# Patient Record
Sex: Male | Born: 1940 | Race: White | Hispanic: No | Marital: Married | Smoking: Former smoker
Health system: Southern US, Community
[De-identification: ages and names within clinical notes are randomized; demographics above are authoritative.]

## PROBLEM LIST (undated history)

## (undated) DIAGNOSIS — I5032 Chronic diastolic (congestive) heart failure: Secondary | ICD-10-CM

## (undated) DIAGNOSIS — I2510 Atherosclerotic heart disease of native coronary artery without angina pectoris: Secondary | ICD-10-CM

## (undated) DIAGNOSIS — I2721 Secondary pulmonary arterial hypertension: Secondary | ICD-10-CM

## (undated) DIAGNOSIS — K219 Gastro-esophageal reflux disease without esophagitis: Secondary | ICD-10-CM

## (undated) DIAGNOSIS — N184 Chronic kidney disease, stage 4 (severe): Secondary | ICD-10-CM

## (undated) DIAGNOSIS — E785 Hyperlipidemia, unspecified: Secondary | ICD-10-CM

## (undated) DIAGNOSIS — I10 Essential (primary) hypertension: Secondary | ICD-10-CM

## (undated) DIAGNOSIS — I4821 Permanent atrial fibrillation: Secondary | ICD-10-CM

## (undated) DIAGNOSIS — S065XAA Traumatic subdural hemorrhage with loss of consciousness status unknown, initial encounter: Secondary | ICD-10-CM

## (undated) DIAGNOSIS — E119 Type 2 diabetes mellitus without complications: Secondary | ICD-10-CM

## (undated) DIAGNOSIS — N179 Acute kidney failure, unspecified: Secondary | ICD-10-CM

## (undated) HISTORY — PX: HERNIA REPAIR: SHX51

## (undated) HISTORY — PX: CORONARY STENT INTERVENTION: CATH118234

## (undated) HISTORY — DX: Gastro-esophageal reflux disease without esophagitis: K21.9

## (undated) HISTORY — PX: CARDIAC CATHETERIZATION: SHX172

## (undated) HISTORY — PX: CYSTOSCOPY: SUR368

## (undated) HISTORY — PX: BLEPHAROPLASTY: SUR158

---
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
## 2012-01-04 DIAGNOSIS — N200 Calculus of kidney: Secondary | ICD-10-CM | POA: Insufficient documentation

## 2014-06-27 DIAGNOSIS — N183 Chronic kidney disease, stage 3 (moderate): Secondary | ICD-10-CM

## 2014-06-27 DIAGNOSIS — I252 Old myocardial infarction: Secondary | ICD-10-CM

## 2014-06-27 DIAGNOSIS — E1122 Type 2 diabetes mellitus with diabetic chronic kidney disease: Secondary | ICD-10-CM

## 2014-06-27 HISTORY — DX: Old myocardial infarction: I25.2

## 2014-09-29 DIAGNOSIS — N401 Enlarged prostate with lower urinary tract symptoms: Secondary | ICD-10-CM | POA: Insufficient documentation

## 2017-01-30 DIAGNOSIS — E559 Vitamin D deficiency, unspecified: Secondary | ICD-10-CM | POA: Insufficient documentation

## 2017-01-30 DIAGNOSIS — N2581 Secondary hyperparathyroidism of renal origin: Secondary | ICD-10-CM | POA: Insufficient documentation

## 2018-05-01 DIAGNOSIS — M19041 Primary osteoarthritis, right hand: Secondary | ICD-10-CM | POA: Diagnosis not present

## 2018-05-01 DIAGNOSIS — E1122 Type 2 diabetes mellitus with diabetic chronic kidney disease: Secondary | ICD-10-CM | POA: Diagnosis not present

## 2018-05-01 DIAGNOSIS — M7989 Other specified soft tissue disorders: Secondary | ICD-10-CM | POA: Diagnosis not present

## 2018-05-01 DIAGNOSIS — M79641 Pain in right hand: Secondary | ICD-10-CM | POA: Diagnosis not present

## 2018-05-01 DIAGNOSIS — I252 Old myocardial infarction: Secondary | ICD-10-CM | POA: Diagnosis not present

## 2018-05-01 DIAGNOSIS — Z6836 Body mass index (BMI) 36.0-36.9, adult: Secondary | ICD-10-CM | POA: Diagnosis not present

## 2018-05-01 DIAGNOSIS — M1990 Unspecified osteoarthritis, unspecified site: Secondary | ICD-10-CM | POA: Diagnosis not present

## 2018-05-01 DIAGNOSIS — Z87891 Personal history of nicotine dependence: Secondary | ICD-10-CM | POA: Diagnosis not present

## 2018-05-01 DIAGNOSIS — I129 Hypertensive chronic kidney disease with stage 1 through stage 4 chronic kidney disease, or unspecified chronic kidney disease: Secondary | ICD-10-CM | POA: Diagnosis not present

## 2018-05-01 DIAGNOSIS — N189 Chronic kidney disease, unspecified: Secondary | ICD-10-CM | POA: Diagnosis not present

## 2018-05-10 DIAGNOSIS — N342 Other urethritis: Secondary | ICD-10-CM | POA: Diagnosis not present

## 2018-05-10 DIAGNOSIS — N183 Chronic kidney disease, stage 3 (moderate): Secondary | ICD-10-CM | POA: Diagnosis not present

## 2018-05-10 DIAGNOSIS — Z794 Long term (current) use of insulin: Secondary | ICD-10-CM | POA: Diagnosis not present

## 2018-05-10 DIAGNOSIS — E1122 Type 2 diabetes mellitus with diabetic chronic kidney disease: Secondary | ICD-10-CM | POA: Diagnosis not present

## 2018-05-10 DIAGNOSIS — M25441 Effusion, right hand: Secondary | ICD-10-CM | POA: Diagnosis not present

## 2018-05-29 DIAGNOSIS — M19042 Primary osteoarthritis, left hand: Secondary | ICD-10-CM | POA: Diagnosis not present

## 2018-05-29 DIAGNOSIS — M19041 Primary osteoarthritis, right hand: Secondary | ICD-10-CM | POA: Diagnosis not present

## 2018-06-01 DIAGNOSIS — E1122 Type 2 diabetes mellitus with diabetic chronic kidney disease: Secondary | ICD-10-CM | POA: Diagnosis not present

## 2018-06-01 DIAGNOSIS — N342 Other urethritis: Secondary | ICD-10-CM | POA: Diagnosis not present

## 2018-06-01 DIAGNOSIS — M02341 Reiter's disease, right hand: Secondary | ICD-10-CM | POA: Diagnosis not present

## 2018-06-01 DIAGNOSIS — N183 Chronic kidney disease, stage 3 (moderate): Secondary | ICD-10-CM | POA: Diagnosis not present

## 2018-06-01 DIAGNOSIS — M79641 Pain in right hand: Secondary | ICD-10-CM | POA: Diagnosis not present

## 2018-06-01 DIAGNOSIS — I10 Essential (primary) hypertension: Secondary | ICD-10-CM | POA: Diagnosis not present

## 2018-06-01 DIAGNOSIS — E785 Hyperlipidemia, unspecified: Secondary | ICD-10-CM | POA: Diagnosis not present

## 2018-06-01 DIAGNOSIS — M25441 Effusion, right hand: Secondary | ICD-10-CM | POA: Diagnosis not present

## 2018-06-01 DIAGNOSIS — Z79899 Other long term (current) drug therapy: Secondary | ICD-10-CM | POA: Diagnosis not present

## 2018-06-01 DIAGNOSIS — M0230 Reiter's disease, unspecified site: Secondary | ICD-10-CM | POA: Diagnosis not present

## 2018-06-01 DIAGNOSIS — E1165 Type 2 diabetes mellitus with hyperglycemia: Secondary | ICD-10-CM | POA: Diagnosis not present

## 2018-06-01 DIAGNOSIS — Z794 Long term (current) use of insulin: Secondary | ICD-10-CM | POA: Diagnosis not present

## 2018-06-01 DIAGNOSIS — I129 Hypertensive chronic kidney disease with stage 1 through stage 4 chronic kidney disease, or unspecified chronic kidney disease: Secondary | ICD-10-CM | POA: Diagnosis not present

## 2018-06-05 DIAGNOSIS — I2510 Atherosclerotic heart disease of native coronary artery without angina pectoris: Secondary | ICD-10-CM | POA: Insufficient documentation

## 2018-06-05 DIAGNOSIS — I25118 Atherosclerotic heart disease of native coronary 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: Secondary | ICD-10-CM | POA: Insufficient documentation

## 2018-06-30 DIAGNOSIS — I2510 Atherosclerotic heart disease of native coronary artery without angina pectoris: Secondary | ICD-10-CM | POA: Diagnosis not present

## 2018-06-30 DIAGNOSIS — N342 Other urethritis: Secondary | ICD-10-CM | POA: Diagnosis not present

## 2018-06-30 DIAGNOSIS — E1122 Type 2 diabetes mellitus with diabetic chronic kidney disease: Secondary | ICD-10-CM | POA: Diagnosis not present

## 2018-06-30 DIAGNOSIS — M25441 Effusion, right hand: Secondary | ICD-10-CM | POA: Diagnosis not present

## 2018-06-30 DIAGNOSIS — N183 Chronic kidney disease, stage 3 (moderate): Secondary | ICD-10-CM | POA: Diagnosis not present

## 2018-06-30 DIAGNOSIS — Z794 Long term (current) use of insulin: Secondary | ICD-10-CM | POA: Diagnosis not present

## 2018-06-30 DIAGNOSIS — I10 Essential (primary) hypertension: Secondary | ICD-10-CM | POA: Diagnosis not present

## 2018-07-04 DIAGNOSIS — R262 Difficulty in walking, not elsewhere classified: Secondary | ICD-10-CM | POA: Diagnosis not present

## 2018-07-04 DIAGNOSIS — I129 Hypertensive chronic kidney disease with stage 1 through stage 4 chronic kidney disease, or unspecified chronic kidney disease: Secondary | ICD-10-CM | POA: Diagnosis not present

## 2018-07-04 DIAGNOSIS — M79641 Pain in right hand: Secondary | ICD-10-CM | POA: Diagnosis not present

## 2018-07-04 DIAGNOSIS — M79642 Pain in left hand: Secondary | ICD-10-CM | POA: Diagnosis not present

## 2018-07-04 DIAGNOSIS — N183 Chronic kidney disease, stage 3 (moderate): Secondary | ICD-10-CM | POA: Diagnosis not present

## 2018-07-04 DIAGNOSIS — M25462 Effusion, left knee: Secondary | ICD-10-CM | POA: Diagnosis not present

## 2018-07-04 DIAGNOSIS — M1712 Unilateral primary osteoarthritis, left knee: Secondary | ICD-10-CM | POA: Diagnosis not present

## 2018-07-04 DIAGNOSIS — M25562 Pain in left knee: Secondary | ICD-10-CM | POA: Diagnosis not present

## 2018-07-04 DIAGNOSIS — N390 Urinary tract infection, site not specified: Secondary | ICD-10-CM | POA: Diagnosis not present

## 2018-07-04 DIAGNOSIS — E1122 Type 2 diabetes mellitus with diabetic chronic kidney disease: Secondary | ICD-10-CM | POA: Diagnosis not present

## 2018-07-04 DIAGNOSIS — Z87891 Personal history of nicotine dependence: Secondary | ICD-10-CM | POA: Diagnosis not present

## 2018-07-04 DIAGNOSIS — Z6835 Body mass index (BMI) 35.0-35.9, adult: Secondary | ICD-10-CM | POA: Diagnosis not present

## 2018-07-04 DIAGNOSIS — I252 Old myocardial infarction: Secondary | ICD-10-CM | POA: Diagnosis not present

## 2018-07-04 DIAGNOSIS — I2510 Atherosclerotic heart disease of native coronary artery without angina pectoris: Secondary | ICD-10-CM | POA: Diagnosis not present

## 2018-07-04 DIAGNOSIS — M109 Gout, unspecified: Secondary | ICD-10-CM | POA: Diagnosis not present

## 2018-07-06 ENCOUNTER — Emergency Department: Payer: Medicare HMO

## 2018-07-06 ENCOUNTER — Other Ambulatory Visit: Payer: Self-pay

## 2018-07-06 ENCOUNTER — Inpatient Hospital Stay
Admission: EM | Admit: 2018-07-06 | Discharge: 2018-07-12 | DRG: 683 | Disposition: A | Discharge status: Skilled Nursing Facility | Payer: Medicare HMO | Attending: Internal Medicine | Admitting: Internal Medicine

## 2018-07-06 ENCOUNTER — Encounter: Payer: Self-pay | Admitting: *Deleted

## 2018-07-06 DIAGNOSIS — R41841 Cognitive communication deficit: Secondary | ICD-10-CM | POA: Diagnosis not present

## 2018-07-06 DIAGNOSIS — E669 Obesity, unspecified: Secondary | ICD-10-CM | POA: Diagnosis present

## 2018-07-06 DIAGNOSIS — N400 Enlarged prostate without lower urinary tract symptoms: Secondary | ICD-10-CM | POA: Diagnosis present

## 2018-07-06 DIAGNOSIS — M10071 Idiopathic gout, right ankle and foot: Secondary | ICD-10-CM | POA: Diagnosis present

## 2018-07-06 DIAGNOSIS — E1122 Type 2 diabetes mellitus with diabetic chronic kidney disease: Secondary | ICD-10-CM | POA: Diagnosis not present

## 2018-07-06 DIAGNOSIS — I4891 Unspecified atrial fibrillation: Secondary | ICD-10-CM | POA: Diagnosis present

## 2018-07-06 DIAGNOSIS — R4781 Slurred speech: Secondary | ICD-10-CM | POA: Diagnosis not present

## 2018-07-06 DIAGNOSIS — I129 Hypertensive chronic kidney disease with stage 1 through stage 4 chronic kidney disease, or unspecified chronic kidney disease: Secondary | ICD-10-CM | POA: Diagnosis present

## 2018-07-06 DIAGNOSIS — W19XXXA Unspecified fall, initial encounter: Secondary | ICD-10-CM | POA: Diagnosis not present

## 2018-07-06 DIAGNOSIS — Z79899 Other long term (current) drug therapy: Secondary | ICD-10-CM

## 2018-07-06 DIAGNOSIS — M25571 Pain in right ankle and joints of right foot: Secondary | ICD-10-CM | POA: Diagnosis not present

## 2018-07-06 DIAGNOSIS — E785 Hyperlipidemia, unspecified: Secondary | ICD-10-CM | POA: Diagnosis not present

## 2018-07-06 DIAGNOSIS — K56600 Partial intestinal obstruction, unspecified as to cause: Secondary | ICD-10-CM | POA: Diagnosis not present

## 2018-07-06 DIAGNOSIS — I10 Essential (primary) hypertension: Secondary | ICD-10-CM | POA: Diagnosis present

## 2018-07-06 DIAGNOSIS — N3000 Acute cystitis without hematuria: Secondary | ICD-10-CM | POA: Diagnosis not present

## 2018-07-06 DIAGNOSIS — Z4659 Encounter for fitting and adjustment of other gastrointestinal appliance and device: Secondary | ICD-10-CM

## 2018-07-06 DIAGNOSIS — Z6838 Body mass index (BMI) 38.0-38.9, adult: Secondary | ICD-10-CM | POA: Diagnosis not present

## 2018-07-06 DIAGNOSIS — Z4682 Encounter for fitting and adjustment of non-vascular catheter: Secondary | ICD-10-CM | POA: Diagnosis not present

## 2018-07-06 DIAGNOSIS — R1110 Vomiting, unspecified: Secondary | ICD-10-CM

## 2018-07-06 DIAGNOSIS — M029 Reactive arthropathy, unspecified: Secondary | ICD-10-CM | POA: Diagnosis not present

## 2018-07-06 DIAGNOSIS — I2510 Atherosclerotic heart disease of native coronary artery without angina pectoris: Secondary | ICD-10-CM | POA: Diagnosis not present

## 2018-07-06 DIAGNOSIS — Z8719 Personal history of other diseases of the digestive system: Secondary | ICD-10-CM

## 2018-07-06 DIAGNOSIS — R531 Weakness: Secondary | ICD-10-CM | POA: Diagnosis not present

## 2018-07-06 DIAGNOSIS — N179 Acute kidney failure, unspecified: Secondary | ICD-10-CM | POA: Diagnosis not present

## 2018-07-06 DIAGNOSIS — Z87891 Personal history of nicotine dependence: Secondary | ICD-10-CM

## 2018-07-06 DIAGNOSIS — R5381 Other malaise: Secondary | ICD-10-CM | POA: Diagnosis not present

## 2018-07-06 DIAGNOSIS — N183 Chronic kidney disease, stage 3 (moderate): Secondary | ICD-10-CM | POA: Diagnosis not present

## 2018-07-06 DIAGNOSIS — E118 Type 2 diabetes mellitus with unspecified complications: Secondary | ICD-10-CM

## 2018-07-06 DIAGNOSIS — N390 Urinary tract infection, site not specified: Secondary | ICD-10-CM

## 2018-07-06 DIAGNOSIS — Z794 Long term (current) use of insulin: Secondary | ICD-10-CM | POA: Diagnosis not present

## 2018-07-06 DIAGNOSIS — R52 Pain, unspecified: Secondary | ICD-10-CM | POA: Diagnosis not present

## 2018-07-06 DIAGNOSIS — N184 Chronic kidney disease, stage 4 (severe): Secondary | ICD-10-CM

## 2018-07-06 DIAGNOSIS — M6281 Muscle weakness (generalized): Secondary | ICD-10-CM | POA: Diagnosis not present

## 2018-07-06 DIAGNOSIS — E119 Type 2 diabetes mellitus without complications: Secondary | ICD-10-CM | POA: Diagnosis not present

## 2018-07-06 DIAGNOSIS — K56609 Unspecified intestinal obstruction, unspecified as to partial versus complete obstruction: Secondary | ICD-10-CM | POA: Diagnosis not present

## 2018-07-06 DIAGNOSIS — R112 Nausea with vomiting, unspecified: Secondary | ICD-10-CM | POA: Diagnosis not present

## 2018-07-06 DIAGNOSIS — Z7401 Bed confinement status: Secondary | ICD-10-CM | POA: Diagnosis not present

## 2018-07-06 DIAGNOSIS — M1000 Idiopathic gout, unspecified site: Secondary | ICD-10-CM | POA: Diagnosis not present

## 2018-07-06 DIAGNOSIS — K6389 Other specified diseases of intestine: Secondary | ICD-10-CM | POA: Diagnosis not present

## 2018-07-06 DIAGNOSIS — K567 Ileus, unspecified: Secondary | ICD-10-CM | POA: Diagnosis not present

## 2018-07-06 DIAGNOSIS — R2681 Unsteadiness on feet: Secondary | ICD-10-CM | POA: Diagnosis not present

## 2018-07-06 DIAGNOSIS — M2550 Pain in unspecified joint: Secondary | ICD-10-CM | POA: Diagnosis not present

## 2018-07-06 DIAGNOSIS — E1169 Type 2 diabetes mellitus with other specified complication: Secondary | ICD-10-CM | POA: Diagnosis present

## 2018-07-06 DIAGNOSIS — N189 Chronic kidney disease, unspecified: Secondary | ICD-10-CM | POA: Diagnosis not present

## 2018-07-06 HISTORY — DX: Essential (primary) hypertension: I10

## 2018-07-06 HISTORY — DX: Atherosclerotic heart disease of native coronary artery without angina pectoris: I25.10

## 2018-07-06 HISTORY — DX: Acute kidney failure, unspecified: N17.9

## 2018-07-06 HISTORY — DX: Type 2 diabetes mellitus without complications: E11.9

## 2018-07-06 HISTORY — DX: Hyperlipidemia, unspecified: E78.5

## 2018-07-06 LAB — URINALYSIS, COMPLETE (UACMP) WITH MICROSCOPIC
Bacteria, UA: NONE SEEN
Bilirubin Urine: NEGATIVE
Glucose, UA: NEGATIVE mg/dL
Ketones, ur: NEGATIVE mg/dL
Nitrite: NEGATIVE
Protein, ur: NEGATIVE mg/dL
Specific Gravity, Urine: 1.014 (ref 1.005–1.030)
pH: 5 (ref 5.0–8.0)

## 2018-07-06 LAB — CBC WITH DIFFERENTIAL/PLATELET
Abs Immature Granulocytes: 0.11 10*3/uL — ABNORMAL HIGH (ref 0.00–0.07)
Basophils Absolute: 0 10*3/uL (ref 0.0–0.1)
Basophils Relative: 0 %
Eosinophils Absolute: 0.2 10*3/uL (ref 0.0–0.5)
Eosinophils Relative: 1 %
HCT: 36.6 % — ABNORMAL LOW (ref 39.0–52.0)
Hemoglobin: 11.6 g/dL — ABNORMAL LOW (ref 13.0–17.0)
Immature Granulocytes: 1 %
Lymphocytes Relative: 14 %
Lymphs Abs: 1.9 10*3/uL (ref 0.7–4.0)
MCH: 27.9 pg (ref 26.0–34.0)
MCHC: 31.7 g/dL (ref 30.0–36.0)
MCV: 88 fL (ref 80.0–100.0)
Monocytes Absolute: 1.3 10*3/uL — ABNORMAL HIGH (ref 0.1–1.0)
Monocytes Relative: 10 %
Neutro Abs: 10.3 10*3/uL — ABNORMAL HIGH (ref 1.7–7.7)
Neutrophils Relative %: 74 %
Platelets: 369 10*3/uL (ref 150–400)
RBC: 4.16 MIL/uL — ABNORMAL LOW (ref 4.22–5.81)
RDW: 14.6 % (ref 11.5–15.5)
WBC: 13.8 10*3/uL — ABNORMAL HIGH (ref 4.0–10.5)
nRBC: 0 % (ref 0.0–0.2)

## 2018-07-06 LAB — COMPREHENSIVE METABOLIC PANEL
ALT: 62 U/L — ABNORMAL HIGH (ref 0–44)
AST: 45 U/L — ABNORMAL HIGH (ref 15–41)
Albumin: 2.9 g/dL — ABNORMAL LOW (ref 3.5–5.0)
Alkaline Phosphatase: 99 U/L (ref 38–126)
Anion gap: 16 — ABNORMAL HIGH (ref 5–15)
BUN: 89 mg/dL — ABNORMAL HIGH (ref 8–23)
CO2: 22 mmol/L (ref 22–32)
Calcium: 8.8 mg/dL — ABNORMAL LOW (ref 8.9–10.3)
Chloride: 97 mmol/L — ABNORMAL LOW (ref 98–111)
Creatinine, Ser: 3.24 mg/dL — ABNORMAL HIGH (ref 0.61–1.24)
GFR calc Af Amer: 20 mL/min — ABNORMAL LOW (ref >60)
GFR calc non Af Amer: 17 mL/min — ABNORMAL LOW (ref >60)
Glucose, Bld: 245 mg/dL — ABNORMAL HIGH (ref 70–99)
Potassium: 4.3 mmol/L (ref 3.5–5.1)
Sodium: 135 mmol/L (ref 135–145)
Total Bilirubin: 0.2 mg/dL — ABNORMAL LOW (ref 0.3–1.2)
Total Protein: 7.1 g/dL (ref 6.5–8.1)

## 2018-07-06 LAB — TROPONIN I: Troponin I: 0.03 ng/mL (ref <0.03)

## 2018-07-06 LAB — GLUCOSE, CAPILLARY: Glucose-Capillary: 154 mg/dL — ABNORMAL HIGH (ref 70–99)

## 2018-07-06 LAB — ETHANOL: Alcohol, Ethyl (B): <10 mg/dL (ref <10)

## 2018-07-06 MED ORDER — SODIUM CHLORIDE 0.9 % IV SOLN
INTRAVENOUS | Status: DC
  Administered 2018-07-06: via INTRAVENOUS

## 2018-07-06 MED ORDER — ACETAMINOPHEN 650 MG RE SUPP: 650.0000 mg | Freq: Four times a day (QID) | RECTAL | Status: DC | PRN

## 2018-07-06 MED ORDER — INSULIN ASPART 100 UNIT/ML ~~LOC~~ SOLN
0.0000 [IU] | Freq: Every day | SUBCUTANEOUS | Status: DC
  Administered 2018-07-07 – 2018-07-09 (×2): 2 [IU] via SUBCUTANEOUS
  Administered 2018-07-10 – 2018-07-11 (×2): 4 [IU] via SUBCUTANEOUS
  Filled 2018-07-06 (×4): qty 1

## 2018-07-06 MED ORDER — ONDANSETRON HCL 4 MG/2ML IJ SOLN
4.0000 mg | Freq: Four times a day (QID) | INTRAMUSCULAR | Status: DC | PRN
  Administered 2018-07-08: 4 mg via INTRAVENOUS
  Filled 2018-07-06: qty 2

## 2018-07-06 MED ORDER — SODIUM CHLORIDE 0.9 % IV SOLN
1.0000 g | Freq: Once | INTRAVENOUS | Status: AC
  Administered 2018-07-06: 1 g via INTRAVENOUS
  Filled 2018-07-06: qty 10

## 2018-07-06 MED ORDER — PRAVASTATIN SODIUM 20 MG PO TABS
20.0000 mg | ORAL_TABLET | Freq: Every evening | ORAL | Status: DC
  Administered 2018-07-06 – 2018-07-07 (×2): 20 mg via ORAL
  Filled 2018-07-06 (×2): qty 1

## 2018-07-06 MED ORDER — ONDANSETRON HCL 4 MG PO TABS
4.0000 mg | ORAL_TABLET | Freq: Four times a day (QID) | ORAL | Status: DC | PRN
  Filled 2018-07-06: qty 1

## 2018-07-06 MED ORDER — NEBIVOLOL HCL 5 MG PO TABS
5.0000 mg | ORAL_TABLET | Freq: Every day | ORAL | Status: DC
  Administered 2018-07-07: 5 mg via ORAL
  Filled 2018-07-06: qty 1

## 2018-07-06 MED ORDER — AMLODIPINE BESYLATE 10 MG PO TABS
10.0000 mg | ORAL_TABLET | Freq: Every day | ORAL | Status: DC
  Administered 2018-07-07: 10 mg via ORAL
  Filled 2018-07-06: qty 1

## 2018-07-06 MED ORDER — HEPARIN SODIUM (PORCINE) 5000 UNIT/ML IJ SOLN
5000.0000 [IU] | Freq: Three times a day (TID) | INTRAMUSCULAR | Status: DC
  Administered 2018-07-06 – 2018-07-12 (×18): 5000 [IU] via SUBCUTANEOUS
  Filled 2018-07-06 (×18): qty 1

## 2018-07-06 MED ORDER — SODIUM CHLORIDE 0.9 % IV SOLN
1.0000 g | INTRAVENOUS | Status: DC
  Administered 2018-07-07 – 2018-07-10 (×4): 1 g via INTRAVENOUS
  Filled 2018-07-06: qty 1
  Filled 2018-07-06: qty 10
  Filled 2018-07-06 (×2): qty 1
  Filled 2018-07-06: qty 10

## 2018-07-06 MED ORDER — ACETAMINOPHEN 325 MG PO TABS
650.0000 mg | ORAL_TABLET | Freq: Four times a day (QID) | ORAL | Status: DC | PRN
  Administered 2018-07-09 – 2018-07-11 (×3): 650 mg via ORAL
  Filled 2018-07-06 (×3): qty 2

## 2018-07-06 MED ORDER — INSULIN NPH (HUMAN) (ISOPHANE) 100 UNIT/ML ~~LOC~~ SUSP
20.0000 [IU] | Freq: Every day | SUBCUTANEOUS | Status: DC
  Filled 2018-07-06: qty 10

## 2018-07-06 MED ORDER — SODIUM CHLORIDE 0.9 % IV BOLUS
500.0000 mL | Freq: Once | INTRAVENOUS | Status: AC
  Administered 2018-07-06: 500 mL via INTRAVENOUS

## 2018-07-06 MED ORDER — INSULIN ASPART 100 UNIT/ML ~~LOC~~ SOLN
0.0000 [IU] | Freq: Three times a day (TID) | SUBCUTANEOUS | Status: DC
  Administered 2018-07-07: 5 [IU] via SUBCUTANEOUS
  Administered 2018-07-07: 3 [IU] via SUBCUTANEOUS
  Administered 2018-07-07 – 2018-07-08 (×3): 2 [IU] via SUBCUTANEOUS
  Administered 2018-07-08: 3 [IU] via SUBCUTANEOUS
  Administered 2018-07-09 (×2): 1 [IU] via SUBCUTANEOUS
  Administered 2018-07-10: 3 [IU] via SUBCUTANEOUS
  Administered 2018-07-10: 2 [IU] via SUBCUTANEOUS
  Administered 2018-07-10: 3 [IU] via SUBCUTANEOUS
  Administered 2018-07-11 – 2018-07-12 (×4): 2 [IU] via SUBCUTANEOUS
  Administered 2018-07-12: 7 [IU] via SUBCUTANEOUS
  Filled 2018-07-06 (×15): qty 1

## 2018-07-06 NOTE — ED Notes (Signed)
ED TO INPATIENT HANDOFF REPORT  ED Nurse Name and Phone #: Wells Guiles [phone]  S Name/Age/Gender Kyle Roberson 78 y.o. male Room/Bed: ED18A/ED18A  Code Status   Code Status: Not on file  Home/SNF/Other Home Patient oriented to: self, place, time and situation Is this baseline? Yes   Triage Complete: Triage complete  Chief Complaint Bilateral Leg pain  Triage Note Pt to triage via wheelchair.  Pt has weakness and is having difficulty standing.  Pt reports aching all over.  Hx recent uti's  Pt alert  Speech clear.    Allergies Allergies  Allergen Reactions  . Atorvastatin Other (See Comments)  . Rosuvastatin Other (See Comments)  . Simvastatin Other (See Comments)    Level of Care/Admitting Diagnosis ED Disposition    ED Disposition Condition Comment   Admit  Hospital Area: [facility] [[id number]]  Level of Care: Med-Surg [16]  Diagnosis: UTI (urinary tract infection) [449675]  Admitting Physician: Lance Coon [[id number]]  Attending Physician: Lance Coon [phone]  Estimated length of stay: past midnight tomorrow  Certification:: I certify this patient will need inpatient services for at least 2 midnights  Possible Covid Disease Patient Isolation: N/A  PT Class (Do Not Modify): Inpatient [101]  PT Acc Code (Do Not Modify): Private [1]       B Medical/Surgery History Past Medical History:  Diagnosis Date  . CAD (coronary artery disease)   . Diabetes ([place])   . HLD (hyperlipidemia)   . HTN (hypertension)    Past Surgical History:  Procedure Laterality Date  . CYSTOSCOPY    . HERNIA REPAIR       A IV Location/Drains/Wounds Patient Lines/Drains/Airways Status   Active Line/Drains/Airways    Name:   Placement date:   Placement time:   Site:   Days:   Peripheral IV 07/06/18 Left Forearm   07/06/18    1900    Forearm   less than 1          Intake/Output Last 24 hours  Intake/Output Summary (Last 24 hours) at 07/06/2018 2218 Last  data filed at 07/06/2018 2200 Gross per 24 hour  Intake 600 ml  Output -  Net 600 ml    Labs/Imaging Results for orders placed or performed during the hospital encounter of 07/06/18 (from the past 48 hour(s))  CBC with Differential     Status: Abnormal   Collection Time: 07/06/18  7:03 PM  Result Value Ref Range   WBC 13.8 (H) 4.0 - 10.5 K/uL   RBC 4.16 (L) 4.22 - 5.81 MIL/uL   Hemoglobin 11.6 (L) 13.0 - 17.0 g/dL   HCT 36.6 (L) 39.0 - 52.0 %   MCV 88.0 80.0 - 100.0 fL   MCH 27.9 26.0 - 34.0 pg   MCHC 31.7 30.0 - 36.0 g/dL   RDW 14.6 11.5 - 15.5 %   Platelets 369 150 - 400 K/uL   nRBC 0.0 0.0 - 0.2 %   Neutrophils Relative % 74 %   Neutro Abs 10.3 (H) 1.7 - 7.7 K/uL   Lymphocytes Relative 14 %   Lymphs Abs 1.9 0.7 - 4.0 K/uL   Monocytes Relative 10 %   Monocytes Absolute 1.3 (H) 0.1 - 1.0 K/uL   Eosinophils Relative 1 %   Eosinophils Absolute 0.2 0.0 - 0.5 K/uL   Basophils Relative 0 %   Basophils Absolute 0.0 0.0 - 0.1 K/uL   Immature Granulocytes 1 %   Abs Immature Granulocytes 0.11 (H) 0.00 - 0.07 K/uL  Comment: Performed at [facility], [address] [place], [place] [address]  Comprehensive metabolic panel     Status: Abnormal   Collection Time: 07/06/18  7:03 PM  Result Value Ref Range   Sodium 135 135 - 145 mmol/L   Potassium 4.3 3.5 - 5.1 mmol/L   Chloride 97 (L) 98 - 111 mmol/L   CO2 22 22 - 32 mmol/L   Glucose, Bld 245 (H) 70 - 99 mg/dL   BUN 89 (H) 8 - 23 mg/dL   Creatinine, Ser 3.24 (H) 0.61 - 1.24 mg/dL   Calcium 8.8 (L) 8.9 - 10.3 mg/dL   Total Protein 7.1 6.5 - 8.1 g/dL   Albumin 2.9 (L) 3.5 - 5.0 g/dL   AST 45 (H) 15 - 41 U/L   ALT 62 (H) 0 - 44 U/L   Alkaline Phosphatase 99 38 - 126 U/L   Total Bilirubin 0.2 (L) 0.3 - 1.2 mg/dL   GFR calc non Af Amer 17 (L) >60 mL/min   GFR calc Af Amer 20 (L) >60 mL/min   Anion gap 16 (H) 5 - 15    Comment: Performed at [facility], [address] [place], [place] [address]  Ethanol      Status: None   Collection Time: 07/06/18  7:03 PM  Result Value Ref Range   Alcohol, Ethyl (B) <10 <10 mg/dL    Comment: (NOTE) Lowest detectable limit for serum alcohol is 10 mg/dL. For medical purposes only. Performed at [facility], [address] [place], [place] [address]   Urinalysis, Complete w Microscopic     Status: Abnormal   Collection Time: 07/06/18  7:03 PM  Result Value Ref Range   Color, Urine YELLOW (A) YELLOW   APPearance HAZY (A) CLEAR   Specific Gravity, Urine 1.014 1.005 - 1.030   pH 5.0 5.0 - 8.0   Glucose, UA NEGATIVE NEGATIVE mg/dL   Hgb urine dipstick MODERATE (A) NEGATIVE   Bilirubin Urine NEGATIVE NEGATIVE   Ketones, ur NEGATIVE NEGATIVE mg/dL   Protein, ur NEGATIVE NEGATIVE mg/dL   Nitrite NEGATIVE NEGATIVE   Leukocytes,Ua TRACE (A) NEGATIVE   RBC / HPF 21-50 0 - 5 RBC/hpf   WBC, UA 11-20 0 - 5 WBC/hpf   Bacteria, UA NONE SEEN NONE SEEN   Squamous Epithelial / LPF 0-5 0 - 5   WBC Clumps PRESENT     Comment: Performed at [facility], [address] [place], [place] [address]  Troponin I - Once     Status: Abnormal   Collection Time: 07/06/18  7:03 PM  Result Value Ref Range   Troponin I 0.03 (HH) <0.03 ng/mL    Comment: CRITICAL RESULT CALLED TO, READ BACK BY AND VERIFIED WITH Itzelle Gains 07/06/18 @ 2033  [facility] Performed at [facility], [address] [place], [place] [address]    Dg Chest Port 1 View  Result Date: 07/06/2018 CLINICAL DATA:  78 year old male with a history of difficulty standing EXAM: PORTABLE CHEST 1 VIEW COMPARISON:  None FINDINGS: Cardiomediastinal silhouette borderline enlarged, accentuated by low lung volumes. Linear opacities at the lung bases. No pneumothorax or large pleural effusion. No confluent airspace disease. IMPRESSION: Low lung volumes with likely atelectasis/scarring, and no evidence of acute cardiopulmonary disease. Electronically Signed   By: Corrie Mckusick D.O.   On:  07/06/2018 19:53    Pending Labs Unresulted Labs (From admission, onward)    Start     Ordered   07/06/18  2048  Urine culture  Add-on,   AD     07/06/18 2047   Signed and Held  CBC  (heparin)  Once,   R    Comments:  Baseline for heparin therapy IF NOT ALREADY DRAWN.  Notify MD if PLT < 100 K.    Signed and Held   Signed and Held  Creatinine, serum  (heparin)  Once,   R    Comments:  Baseline for heparin therapy IF NOT ALREADY DRAWN.    Signed and Held   Signed and Held  Basic metabolic panel  Tomorrow morning,   R     Signed and Held   Signed and Held  CBC  Tomorrow morning,   R     Signed and Held          Vitals/Pain Today's Vitals   07/06/18 1835 07/06/18 1901 07/06/18 2130 07/06/18 2200  BP:  140/66 126/73 (!) 138/92  Pulse:  (!) 57 (!) 56 (!) 51  Resp:  18 18 16   Temp:  97.7 F (36.5 C)    TempSrc:      SpO2:  96% 97% 95%  Weight: 113.4 kg     Height: 5\' 8"  (1.727 m)     PainSc:        Isolation Precautions No active isolations  Medications Medications  sodium chloride 0.9 % bolus 500 mL (0 mLs Intravenous Stopped 07/06/18 2200)  cefTRIAXone (ROCEPHIN) 1 g in sodium chloride 0.9 % 100 mL IVPB (0 g Intravenous Stopped 07/06/18 2143)    Mobility non-ambulatory (uses walker, cane and has friends help him)  Moderate fall risk   Focused Assessments Cardiac Assessment Handoff:    Lab Results  Component Value Date   TROPONINI 0.03 ([place]) 07/06/2018   No results found for: DDIMER Does the Patient currently have chest pain? No     R Recommendations: See Admitting Provider Note  Report given to:   Additional Notes:  N/a

## 2018-07-06 NOTE — ED Notes (Signed)
Pt reports trouble standing up or walking x 1 week, pt reports he cannot walk at all

## 2018-07-06 NOTE — ED Notes (Signed)
Date and time results received: 07/06/18 2033   Test: Troponin  Critical Value: 0.03  Name of Provider Notified: Dr. Burlene Arnt

## 2018-07-06 NOTE — ED Triage Notes (Addendum)
Pt to triage via wheelchair.  Pt has weakness and is having difficulty standing.  Pt reports aching all over.  Hx recent uti's  Pt alert  Speech clear.

## 2018-07-06 NOTE — ED Notes (Signed)
Pt's HCPOA, Nancy Marus is waiting in parking lot.  She can be reached at [phone] with any questions.

## 2018-07-06 NOTE — H&P (Signed)
Kyle Roberson at Kyle Roberson NAME: Kyle Roberson    MR#:  [id number]  DATE OF BIRTH:  1940-07-14  DATE OF ADMISSION:  07/06/2018  PRIMARY CARE PHYSICIAN: Patient, No Pcp Per   REQUESTING/REFERRING PHYSICIAN: Burlene Arnt, MD  CHIEF COMPLAINT:   Chief Complaint  Patient presents with  . Weakness    HISTORY OF PRESENT ILLNESS:  Kyle Roberson  is a 78 y.o. male who presents with chief complaint as above.  Patient presents the ED with a complaint of weakness.  He states that he was previously diagnosed with a UTI but did not receive full treatment.  Per his report this was a few months ago.  However, he states that he has recently become more weak to the point that he is not able to get up and move around.  UA here is suspicious for UTI.  He also has increased creatinine from his baseline.  Hospitalist called for admission  PAST MEDICAL HISTORY:   Past Medical History:  Diagnosis Date  . CAD (coronary artery disease)   . Diabetes ([place])   . HLD (hyperlipidemia)   . HTN (hypertension)      PAST SURGICAL HISTORY:   Past Surgical History:  Procedure Laterality Date  . CYSTOSCOPY    . HERNIA REPAIR       SOCIAL HISTORY:   Social History   Tobacco Use  . Smoking status: Former Research scientist (life sciences)  . Smokeless tobacco: Never Used  Substance Use Topics  . Alcohol use: Yes     FAMILY HISTORY:   Family History  Problem Relation Age of Onset  . Pancreatic cancer Mother   . CAD Father   . Diabetes Brother      DRUG ALLERGIES:   Allergies  Allergen Reactions  . Atorvastatin Other (See Comments)  . Rosuvastatin Other (See Comments)  . Simvastatin Other (See Comments)    MEDICATIONS AT HOME:   Prior to Admission medications   Medication Sig Start Date End Date Taking? Authorizing Provider  amLODipine (NORVASC) 10 MG tablet Take 10 mg by mouth daily. 06/01/18 06/01/19 Yes [provider]  ciprofloxacin (CIPRO) 250 MG  tablet Take 250 mg by mouth 2 (two) times daily. 06/30/18 07/15/18 Yes [provider]  colchicine 0.6 MG tablet Take 0.6 mg by mouth as directed. 06/01/18  Yes [provider]  diclofenac sodium (VOLTAREN) 1 % GEL Apply 2 g topically 4 (four) times daily. 06/30/18 06/30/19 Yes [provider]  glipiZIDE (GLUCOTROL XL) 10 MG 24 hr tablet Take 10 mg by mouth daily. 06/01/18 06/01/19 Yes [provider]  insulin NPH Human (HUMULIN N,NOVOLIN N) 100 UNIT/ML injection Inject 35 Units into the skin at bedtime. 11/30/17 11/30/18 Yes [provider]  Lidocaine 4 % PTCH Place 1 patch onto the skin daily. 07/04/18  Yes [provider]  losartan-hydrochlorothiazide (HYZAAR) 100-25 MG tablet Take 1 tablet by mouth daily. 06/01/18 06/01/19 Yes [provider]  nebivolol (BYSTOLIC) 5 MG tablet Take 5 mg by mouth daily. 06/30/18  Yes [provider]  nitroGLYCERIN (NITROLINGUAL) 0.4 MG/SPRAY spray Place 1 spray under the tongue as directed. 06/30/18 06/30/19 Yes [provider]  Omega 3 1000 MG CAPS Take 1 capsule by mouth daily. 04/14/06  Yes [provider]  pravastatin (PRAVACHOL) 20 MG tablet Take 20 mg by mouth every evening. 06/01/18 06/01/19 Yes [provider]  silver sulfADIAZINE (SILVADENE) 1 % cream Apply 1 application topically 2 (two) times daily. 06/30/18 06/30/19  Yes [provider]    REVIEW OF SYSTEMS:  Review of Systems  Constitutional: Negative for chills, fever, malaise/fatigue and weight loss.  HENT: Negative for ear pain, hearing loss and tinnitus.   Eyes: Negative for blurred vision, double vision, pain and redness.  Respiratory: Negative for cough, hemoptysis and shortness of breath.   Cardiovascular: Negative for chest pain, palpitations, orthopnea and leg swelling.  Gastrointestinal: Negative for abdominal pain, constipation, diarrhea, nausea and vomiting.  Genitourinary: Negative for dysuria, frequency  and hematuria.  Musculoskeletal: Negative for back pain, joint pain and neck pain.  Skin:       No acne, rash, or lesions  Neurological: Positive for weakness. Negative for dizziness, tremors and focal weakness.  Endo/Heme/Allergies: Negative for polydipsia. Does not bruise/bleed easily.  Psychiatric/Behavioral: Negative for depression. The patient is not nervous/anxious and does not have insomnia.      VITAL SIGNS:   Vitals:   07/06/18 1833 07/06/18 1835 07/06/18 1901 07/06/18 2130  BP: (!) 118/58  140/66 126/73  Pulse: (!) 56  (!) 57 (!) 56  Resp: 20  18 18   Temp: 97.8 F (36.6 C)  97.7 F (36.5 C)   TempSrc: Oral     SpO2: 98%  96% 97%  Weight:  113.4 kg    Height:  5\' 8"  (1.727 m)     Wt Readings from Last 3 Encounters:  07/06/18 113.4 kg    PHYSICAL EXAMINATION:  Physical Exam  Vitals reviewed. Constitutional: He is oriented to person, place, and time. He appears well-developed and well-nourished. No distress.  HENT:  Head: Normocephalic and atraumatic.  Mouth/Throat: Oropharynx is clear and moist.  Eyes: Pupils are equal, round, and reactive to light. Conjunctivae and EOM are normal. No scleral icterus.  Neck: Normal range of motion. Neck supple. No JVD present. No thyromegaly present.  Cardiovascular: Normal rate, regular rhythm and intact distal pulses. Exam reveals no gallop and no friction rub.  No murmur heard. Respiratory: Effort normal and breath sounds normal. No respiratory distress. He has no wheezes. He has no rales.  GI: Soft. Bowel sounds are normal. He exhibits no distension. There is no abdominal tenderness.  Musculoskeletal: Normal range of motion.        General: No edema.     Comments: No arthritis, no gout  Lymphadenopathy:    He has no cervical adenopathy.  Neurological: He is alert and oriented to person, place, and time. No cranial nerve deficit.  No dysarthria, no aphasia  Skin: Skin is warm and dry. No rash noted. No erythema.   Psychiatric: He has a normal mood and affect. His behavior is normal. Judgment and thought content normal.    LABORATORY PANEL:   CBC Recent Labs  Lab 07/06/18 1903  WBC 13.8*  HGB 11.6*  HCT 36.6*  PLT 369   ------------------------------------------------------------------------------------------------------------------  Chemistries  Recent Labs  Lab 07/06/18 1903  NA 135  K 4.3  CL 97*  CO2 22  GLUCOSE 245*  BUN 89*  CREATININE 3.24*  CALCIUM 8.8*  AST 45*  ALT 62*  ALKPHOS 99  BILITOT 0.2*   ------------------------------------------------------------------------------------------------------------------  Cardiac Enzymes Recent Labs  Lab 07/06/18 1903  TROPONINI 0.03*   ------------------------------------------------------------------------------------------------------------------  RADIOLOGY:  Dg Chest Port 1 View  Result Date: 07/06/2018 CLINICAL DATA:  78 year old male with a history of difficulty standing EXAM: PORTABLE CHEST 1 VIEW COMPARISON:  None FINDINGS: Cardiomediastinal silhouette borderline enlarged, accentuated by low lung volumes. Linear opacities at the lung bases. No pneumothorax or  large pleural effusion. No confluent airspace disease. IMPRESSION: Low lung volumes with likely atelectasis/scarring, and no evidence of acute cardiopulmonary disease. Electronically Signed   By: Corrie Mckusick D.O.   On: 07/06/2018 19:53    EKG:   Orders placed or performed during the hospital encounter of 07/06/18  . ED EKG  . ED EKG  . EKG 12-Lead  . 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 Labs, imaging and/or ECG reviewed by provider and discussed with patient/family. Management plans discussed with the patient and/or family.  DVT PROPHYLAXIS: SubQ heparin  GI PROPHYLAXIS:  None  ADMISSION STATUS: Inpatient     CODE STATUS: Full Advance Directive Documentation     Most Recent Value  Type of Advance Directive  Living will  Pre-existing out of facility DNR order (yellow form or pink MOST form)  -  "MOST" Form in Place?  -      TOTAL TIME TAKING CARE OF THIS PATIENT: 45 minutes.   Ethlyn Daniels 07/06/2018, 9:53 PM  [facility]  Office  [phone]  CC: Primary care physician; Patient, No Pcp Per  Note:  This document was prepared using Dragon voice recognition software and may include unintentional dictation errors.

## 2018-07-06 NOTE — ED Provider Notes (Signed)
[facility] Emergency Department Provider Note  ____________________________________________   I have reviewed the triage vital signs and the nursing notes. Where available I have reviewed prior notes and, if possible and indicated, outside hospital notes.    HISTORY  Chief Complaint Weakness    HPI Kyle Roberson is a 78 y.o. male who is here today because he feels weak.  He states his been feeling generally weak since Christmas.  He states his doctors are useless.  He wants another opinion.  He was at another emergency room earlier today complaining of urinary tract infection.  He states his urine is "okay" for me and seems to actually give a different history that he gave them.  He denies drinking alcohol.  He denies any focal numbness or weakness.  He denies any fall or hitting his head.  He states he wants to get to the bottom of why he is so weak.  He is supposed to see a neurologist in a few days he states but he is some skepticism as to whether he will be able to see them because of the coronavirus.  He denies any cough or abdominal pain.  He has arthritis of the history but he states he is not sure if he actually has arthritis.  He states he feels generally weak all the time and has since "before Christmas." To me he denied dysuria he states his urine is possibly infected he was given a dose of antibiotics and a urine culture was taken today.  Note from outside hospital however does take about body aches, he denies body aches to me.   No past medical history on file.  There are no active problems to display for this patient.     Prior to Admission medications   Not on File    Allergies Patient has no known allergies.  No family history on file.  Social History Social History   Tobacco Use  . Smoking status: Former Research scientist (life sciences)  . Smokeless tobacco: Never Used  Substance Use Topics  . Alcohol use: Yes  . Drug use: Not Currently    Review of  Systems Constitutional: No fever/chills Eyes: No visual changes. ENT: No sore throat. No stiff neck no neck pain Cardiovascular: Denies chest pain. Respiratory: Denies shortness of breath. Gastrointestinal:   no vomiting.  No diarrhea.  No constipation. Genitourinary: Negative for dysuria. Musculoskeletal: Negative lower extremity swelling Skin: Negative for rash. Neurological: Negative for severe headaches, focal weakness or numbness.   ____________________________________________   PHYSICAL EXAM:  VITAL SIGNS: ED Triage Vitals  Enc Vitals Group     BP 07/06/18 1833 (!) 118/58     Pulse Rate 07/06/18 1833 (!) 56     Resp 07/06/18 1833 20     Temp 07/06/18 1833 97.8 F (36.6 C)     Temp Source 07/06/18 1833 Oral     SpO2 07/06/18 1833 98 %     Weight 07/06/18 1835 250 lb (113.4 kg)     Height 07/06/18 1835 5\' 8"  (1.727 m)     Head Circumference --      Peak Flow --      Pain Score 07/06/18 1834 0     Pain Loc --      Pain Edu? --      Excl. in [place]? --     Constitutional: Alert and oriented. Well appearing and in no acute distress. Eyes: Conjunctivae are normal Head: Atraumatic HEENT: No congestion/rhinnorhea. Mucous membranes are moist.  Oropharynx non-erythematous Neck:   Nontender with no meningismus, no masses, no stridor Cardiovascular: Normal rate, regular rhythm. Grossly normal heart sounds.  Good peripheral circulation. Respiratory: Normal respiratory effort.  No retractions. Lungs CTAB. Abdominal: Soft and nontender. No distention. No guarding no rebound Back:  There is no focal tenderness or step off.  there is no midline tenderness there are no lesions noted. there is no CVA tenderness Musculoskeletal: No lower extremity tenderness, no upper extremity tenderness. No joint effusions, no DVT signs strong distal pulses no edema Neurologic:  Normal speech and language. No gross focal neurologic deficits are appreciated.  Skin:  Skin is warm, dry and intact. No  rash noted. Psychiatric: Mood and affect are normal. Speech and behavior are normal.  ____________________________________________   LABS (all labs ordered are listed, but only abnormal results are displayed)  Labs Reviewed  CBC WITH DIFFERENTIAL/PLATELET  COMPREHENSIVE METABOLIC PANEL  ETHANOL  URINALYSIS, COMPLETE (UACMP) WITH MICROSCOPIC  TROPONIN I    Pertinent labs  results that were available during my care of the patient were reviewed by me and considered in my medical decision making (see chart for details). ____________________________________________  EKG  I personally interpreted any EKGs ordered by me or triage  ____________________________________________  RADIOLOGY  Pertinent labs & imaging results that were available during my care of the patient were reviewed by me and considered in my medical decision making (see chart for details). If possible, patient and/or family made aware of any abnormal findings.  No results found. ____________________________________________    PROCEDURES  Procedure(s) performed: None  Procedures  Critical Care performed: None  ____________________________________________   INITIAL IMPRESSION / ASSESSMENT AND PLAN / ED COURSE  Pertinent labs & imaging results that were available during my care of the patient were reviewed by me and considered in my medical decision making (see chart for details).  Patient is awake and alert no acute distress nonfocal neurologic exam symptoms for several months, this is a global pandemic and obviously there is some risk to him being in the emergency room at this time we do have COVID positive patient's.  We will institute a broad work-up to make sure that I do not see any acute pathology that could be responsible for him feeling generally weak and not focally we cannot think a CT scan is indicated, he does have outpatient follow-up with neurology and PCP for this chronic issue, we are simply  here for second opinion for him as to why he has been having this trouble for nearly 5 months and I have tried to set his expectations appropriately.    ____________________________________________   FINAL CLINICAL IMPRESSION(S) / ED DIAGNOSES  Final diagnoses:  None      This chart was dictated using voice recognition software.  Despite best efforts to proofread,  errors can occur which can change meaning.      Schuyler Amor, MD 07/06/18 1929

## 2018-07-07 ENCOUNTER — Inpatient Hospital Stay: Payer: Medicare HMO

## 2018-07-07 ENCOUNTER — Inpatient Hospital Stay
Admit: 2018-07-07 | Discharge: 2018-07-07 | Disposition: A | Discharge status: Home / Self Care | Payer: Medicare HMO | Attending: Internal Medicine | Admitting: Internal Medicine

## 2018-07-07 LAB — BASIC METABOLIC PANEL
Anion gap: 14 (ref 5–15)
BUN: 96 mg/dL — ABNORMAL HIGH (ref 8–23)
CO2: 21 mmol/L — ABNORMAL LOW (ref 22–32)
Calcium: 8.5 mg/dL — ABNORMAL LOW (ref 8.9–10.3)
Chloride: 103 mmol/L (ref 98–111)
Creatinine, Ser: 3.12 mg/dL — ABNORMAL HIGH (ref 0.61–1.24)
GFR calc Af Amer: 21 mL/min — ABNORMAL LOW (ref >60)
GFR calc non Af Amer: 18 mL/min — ABNORMAL LOW (ref >60)
Glucose, Bld: 160 mg/dL — ABNORMAL HIGH (ref 70–99)
Potassium: 4.3 mmol/L (ref 3.5–5.1)
Sodium: 138 mmol/L (ref 135–145)

## 2018-07-07 LAB — URINE CULTURE

## 2018-07-07 LAB — GLUCOSE, CAPILLARY
Glucose-Capillary: 157 mg/dL — ABNORMAL HIGH (ref 70–99)
Glucose-Capillary: 291 mg/dL — ABNORMAL HIGH (ref 70–99)
Glucose-Capillary: 216 mg/dL — ABNORMAL HIGH (ref 70–99)
Glucose-Capillary: 241 mg/dL — ABNORMAL HIGH (ref 70–99)

## 2018-07-07 LAB — CBC
HCT: 33.3 % — ABNORMAL LOW (ref 39.0–52.0)
Hemoglobin: 10.7 g/dL — ABNORMAL LOW (ref 13.0–17.0)
MCH: 27.7 pg (ref 26.0–34.0)
MCHC: 32.1 g/dL (ref 30.0–36.0)
MCV: 86.3 fL (ref 80.0–100.0)
Platelets: 344 10*3/uL (ref 150–400)
RBC: 3.86 MIL/uL — ABNORMAL LOW (ref 4.22–5.81)
RDW: 14.8 % (ref 11.5–15.5)
WBC: 12.7 10*3/uL — ABNORMAL HIGH (ref 4.0–10.5)
nRBC: 0 % (ref 0.0–0.2)

## 2018-07-07 LAB — URIC ACID: Uric Acid, Serum: 12 mg/dL — ABNORMAL HIGH (ref 3.7–8.6)

## 2018-07-07 MED ORDER — ALUM & MAG HYDROXIDE-SIMETH 200-200-20 MG/5ML PO SUSP
15.0000 mL | ORAL | Status: DC | PRN
  Administered 2018-07-07 – 2018-07-12 (×3): 15 mL via ORAL
  Filled 2018-07-07 (×3): qty 30

## 2018-07-07 MED ORDER — SODIUM CHLORIDE 0.9 % IV SOLN
INTRAVENOUS | Status: DC
  Administered 2018-07-07 – 2018-07-10 (×4): via INTRAVENOUS

## 2018-07-07 MED ORDER — INSULIN DETEMIR 100 UNIT/ML ~~LOC~~ SOLN
20.0000 [IU] | Freq: Every day | SUBCUTANEOUS | Status: DC
  Administered 2018-07-07: 20 [IU] via SUBCUTANEOUS
  Filled 2018-07-07 (×2): qty 0.2

## 2018-07-07 MED ORDER — TAMSULOSIN HCL 0.4 MG PO CAPS
0.4000 mg | ORAL_CAPSULE | Freq: Every day | ORAL | Status: DC
  Administered 2018-07-07 – 2018-07-12 (×5): 0.4 mg via ORAL
  Filled 2018-07-07 (×6): qty 1

## 2018-07-07 MED ORDER — CALCIUM CARBONATE ANTACID 500 MG PO CHEW
2.0000 | CHEWABLE_TABLET | Freq: Four times a day (QID) | ORAL | Status: DC | PRN
  Administered 2018-07-07 – 2018-07-08 (×2): 400 mg via ORAL
  Filled 2018-07-07 (×2): qty 2

## 2018-07-07 MED ORDER — COLCHICINE 0.6 MG PO TABS
0.6000 mg | ORAL_TABLET | Freq: Once | ORAL | Status: AC
  Administered 2018-07-07: 0.6 mg via ORAL
  Filled 2018-07-07: qty 1

## 2018-07-07 MED ORDER — PREDNISONE 20 MG PO TABS
20.0000 mg | ORAL_TABLET | Freq: Once | ORAL | Status: AC
  Administered 2018-07-07: 20 mg via ORAL
  Filled 2018-07-07: qty 1

## 2018-07-07 MED ORDER — OMEGA-3-ACID ETHYL ESTERS 1 G PO CAPS
1.0000 g | ORAL_CAPSULE | Freq: Every day | ORAL | Status: DC
  Administered 2018-07-07: 1 g via ORAL
  Filled 2018-07-07 (×2): qty 1

## 2018-07-07 MED ORDER — ASPIRIN EC 81 MG PO TBEC
81.0000 mg | DELAYED_RELEASE_TABLET | Freq: Every day | ORAL | Status: DC
  Administered 2018-07-07: 81 mg via ORAL
  Filled 2018-07-07: qty 1

## 2018-07-07 NOTE — Progress Notes (Signed)
*  PRELIMINARY RESULTS* Echocardiogram 2D Echocardiogram has been performed.  Kyle Roberson 07/07/2018, 2:31 PM

## 2018-07-07 NOTE — Evaluation (Signed)
Physical Therapy Evaluation Patient Details Name: Kyle Roberson MRN: [id number] DOB: [birth year] Today's Date: 07/07/2018   History of Present Illness  Pt is a 78 y.o. male presenting to hospital 07/06/18 with weakness (for about 5 months) and slurred speech.  Pt admitted with acute kidney injury on CKD, acute cystitis, R ankle pain (possibly gout), and a-fib.  PMH includes CAD, DM, htn.  Clinical Impression  Prior to hospital admission, pt reports he has been unable to walk recently and last week was the last time he was able to stand and transfer to chair (but required significant assist of his girlfriend).  Pt lives with his girlfriend on main level of home with 3 steps (no railings) to enter.  Currently pt is min assist with logrolling to L in bed.  Pt's HR noted to be fluctuating in low 40's to low 50's bpm beginning of session (nurse aware and cleared pt for participation in PT) but after logrolling HR increased briefly to 54 bpm and then gradually decreased to 36-38 bpm briefly before returning to low 40's (nurse notified).  Deferred further mobility d/t low HR concerns.  Pt would benefit from skilled PT to address noted impairments and functional limitations (see below for any additional details).  Upon hospital discharge, recommend pt discharge to [facility].    Follow Up Recommendations SNF    Equipment Recommendations  Rolling walker with 5" wheels;Wheelchair (measurements PT);Wheelchair cushion (measurements PT);3in1 (PT)    Recommendations for Other Services OT consult     Precautions / Restrictions Precautions Precautions: Fall Precaution Comments: monitor HR Restrictions Weight Bearing Restrictions: No      Mobility  Bed Mobility Overal bed mobility: Needs Assistance Bed Mobility: Rolling Rolling: Min assist         General bed mobility comments: pt able to logroll to L with minimal assist and use of bedrail  Transfers                 General transfer  comment: deferred d/t HR concerns  Ambulation/Gait             General Gait Details: deferred d/t HR concerns  Stairs            Wheelchair Mobility    Modified Rankin (Stroke Patients Only)       Balance                                             Pertinent Vitals/Pain Pain Assessment: 0-10 Pain Score: 0-No pain(at rest no pain; pt did grimace when moving R ankle (into DF) and R 2nd and 3rd fingers (into flexion)) Pain Intervention(s): Limited activity within patient's tolerance;Monitored during session;Repositioned  O2 sats WFL during session on room air.    Home Living Family/patient expects to be discharged to:: Private residence Living Arrangements: Spouse/significant other(pt's girlfriend "[name]") Available Help at Discharge: Friend(s) Type of Home: House Home Access: Stairs to enter Entrance Stairs-Rails: None Entrance Stairs-Number of Steps: 3 Home Layout: Two level(lives on main level (also has basement)) Home Equipment: Grab bars - tub/shower;Walker - 4 wheels      Prior Function Level of Independence: Needs assistance   Gait / Transfers Assistance Needed: Pt reports last time he stood was last week and needed assist to stand and get to chair (pt reports he has been unable to walk recently but pt did not state when he  last was able to walk)           Hand Dominance        Extremity/Trunk Assessment   Upper Extremity Assessment Upper Extremity Assessment: Generalized weakness;RUE deficits/detail;LUE deficits/detail RUE Deficits / Details: pt unable to bend 2nd and 3rd fingers into flexion (pt reporting d/t OA and gout); at least 3/5 AROM elbow flexion/extension and shoulder flexion to 90 degrees LUE Deficits / Details: good hand grip strength; at least 3/5 AROM shoulder flexion (to grossly 90 degrees) and elbow flexion/extension    Lower Extremity Assessment Lower Extremity Assessment: Generalized weakness    Cervical /  Trunk Assessment Cervical / Trunk Assessment: Normal  Communication   Communication: No difficulties  Cognition Arousal/Alertness: Awake/alert Behavior During Therapy: WFL for tasks assessed/performed Overall Cognitive Status: Within Functional Limits for tasks assessed                                        General Comments   Nursing cleared pt for participation in physical therapy.  Pt agreeable to PT session.    Exercises     Assessment/Plan    PT Assessment Patient needs continued PT services  PT Problem List Decreased strength;Decreased range of motion;Decreased activity tolerance;Decreased balance;Decreased mobility;Decreased knowledge of use of DME;Cardiopulmonary status limiting activity;Pain       PT Treatment Interventions DME instruction;Gait training;Stair training;Functional mobility training;Therapeutic activities;Therapeutic exercise;Balance training;Patient/family education    PT Goals (Current goals can be found in the Care Plan section)  Acute Rehab PT Goals Patient Stated Goal: to improve strength and mobility PT Goal Formulation: With patient Time For Goal Achievement: 07/21/18 Potential to Achieve Goals: Fair    Frequency Min 2X/week   Barriers to discharge Decreased caregiver support      Co-evaluation               AM-PAC PT "6 Clicks" Mobility  Outcome Measure Help needed turning from your back to your side while in a flat bed without using bedrails?: A Little Help needed moving from lying on your back to sitting on the side of a flat bed without using bedrails?: A Lot Help needed moving to and from a bed to a chair (including a wheelchair)?: Total Help needed standing up from a chair using your arms (e.g., wheelchair or bedside chair)?: Total Help needed to walk in hospital room?: Total Help needed climbing 3-5 steps with a railing? : Total 6 Click Score: 9    End of Session   Activity Tolerance: Other  (comment)(Limited d/t HR concerns) Patient left: in bed;with call bell/phone within reach;with bed alarm set Nurse Communication: Mobility status;Precautions;Other (comment)(pt's HR during session) PT Visit Diagnosis: Other abnormalities of gait and mobility (R26.89);Muscle weakness (generalized) (M62.81);Difficulty in walking, not elsewhere classified (R26.2);Pain Pain - Right/Left: Right Pain - part of body: Ankle and joints of foot    Time: 1017-5102 PT Time Calculation (min) (ACUTE ONLY): 27 min   Charges:   PT Evaluation $PT Eval Low Complexity: 1 Low         [place], PT 07/07/18, 4:10 PM [phone]

## 2018-07-07 NOTE — Progress Notes (Signed)
Patient ID: Kyle Roberson, male   DOB: 1941/03/20, 78 y.o.   MRN: [id number]  [facility] PROGRESS NOTE  Kyle Roberson [id number]:[id number] DOB: 1940-04-08 DOA: 07/06/2018 PCP: Patient, No Pcp Per  HPI/Subjective: Patient feeling weakness all over.  States he also has some joint pains all over.  He states he has not walked in about a week.  [name] his significant other noticed some slurred speech yesterday.  Objective: Vitals:   07/07/18 0408 07/07/18 0447  BP: (!) 134/116 138/65  Pulse: 60 (!) 55  Resp: 18   Temp: 98.6 F (37 C)   SpO2: 90%     Filed Weights   07/06/18 1835  Weight: 113.4 kg    ROS: Review of Systems  Constitutional: Positive for malaise/fatigue. Negative for chills and fever.  Eyes: Negative for blurred vision.  Respiratory: Negative for cough and shortness of breath.   Cardiovascular: Negative for chest pain.  Gastrointestinal: Negative for abdominal pain, constipation, diarrhea, nausea and vomiting.  Genitourinary: Negative for dysuria.  Musculoskeletal: Positive for joint pain.  Neurological: Negative for dizziness and headaches.   Exam: Physical Exam  HENT:  Nose: No mucosal edema.  Mouth/Throat: No oropharyngeal exudate or posterior oropharyngeal edema.  Eyes: Pupils are equal, round, and reactive to light. Conjunctivae, EOM and lids are normal.  Neck: No JVD present. Carotid bruit is not present. No edema present. No thyroid mass and no thyromegaly present.  Cardiovascular: S1 normal and S2 normal. An irregularly irregular rhythm present. Exam reveals no gallop.  No murmur heard. Pulses:      Dorsalis pedis pulses are 2+ on the right side and 2+ on the left side.  Respiratory: No respiratory distress. He has no wheezes. He has no rhonchi. He has no rales.  GI: Soft. Bowel sounds are normal. There is no abdominal tenderness.  Musculoskeletal:     Right ankle: He exhibits decreased range of motion and swelling.     Left ankle: He exhibits  swelling.  Lymphadenopathy:    He has no cervical adenopathy.  Neurological: He is alert. No cranial nerve deficit.  Skin: Skin is warm. No rash noted. Nails show no clubbing.  Psychiatric: He has a normal mood and affect.      Data Reviewed: Basic Metabolic Panel: Recent Labs  Lab 07/06/18 1903 07/07/18 0443  NA 135 138  K 4.3 4.3  CL 97* 103  CO2 22 21*  GLUCOSE 245* 160*  BUN 89* 96*  CREATININE 3.24* 3.12*  CALCIUM 8.8* 8.5*   Liver Function Tests: Recent Labs  Lab 07/06/18 1903  AST 45*  ALT 62*  ALKPHOS 99  BILITOT 0.2*  PROT 7.1  ALBUMIN 2.9*   CBC: Recent Labs  Lab 07/06/18 1903 07/07/18 0443  WBC 13.8* 12.7*  NEUTROABS 10.3*  --   HGB 11.6* 10.7*  HCT 36.6* 33.3*  MCV 88.0 86.3  PLT 369 344   Cardiac Enzymes: Recent Labs  Lab 07/06/18 1903  TROPONINI 0.03*   CBG: Recent Labs  Lab 07/06/18 2306 07/07/18 0805  GLUCAP 154* 157*     Studies: Dg Chest Port 1 View  Result Date: 07/06/2018 CLINICAL DATA:  78 year old male with a history of difficulty standing EXAM: PORTABLE CHEST 1 VIEW COMPARISON:  None FINDINGS: Cardiomediastinal silhouette borderline enlarged, accentuated by low lung volumes. Linear opacities at the lung bases. No pneumothorax or large pleural effusion. No confluent airspace disease. IMPRESSION: Low lung volumes with likely atelectasis/scarring, and no evidence of acute cardiopulmonary disease. Electronically Signed  By: Corrie Mckusick D.O.   On: 07/06/2018 19:53    Scheduled Meds: . amLODipine  10 mg Oral Daily  . heparin  5,000 Units Subcutaneous Q8H  . insulin aspart  0-5 Units Subcutaneous QHS  . insulin aspart  0-9 Units Subcutaneous TID WC  . insulin detemir  20 Units Subcutaneous QHS  . nebivolol  5 mg Oral Daily  . pravastatin  20 mg Oral QPM  . tamsulosin  0.4 mg Oral QPC breakfast   Continuous Infusions: . sodium chloride 75 mL/hr at 07/07/18 0600  . cefTRIAXone (ROCEPHIN)  IV       Assessment/Plan:  1. 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  IV fluid hydration.  Check a renal ultrasound. 2. Acute cystitis.  Follow-up urine culture.  Continue Rocephin. 3. Right ankle pain.  Possible gout.  Check a uric acid.  Give 1 dose of colchicine and 1 dose of prednisone for now. 4. Type 2 diabetes mellitus on detemir insulin and sliding scale. 5. BPH start Flomax.  Check urine post void residual by bladder scan. 6. Hypertension on amlodipine 7. Atrial fibrillation on nebivolol.  Check an echocardiogram. 8. Significant other noticed some thickening of his speech yesterday.  We will get a CT scan of the head.  Start on low-dose aspirin. 9. Weakness.  Physical therapy evaluation  Code Status:     Code Status Orders  (From admission, onward)         Start     Ordered   07/06/18 2254  Full code  Continuous     07/06/18 2253        Code Status History    This patient has a current code status but no historical code status.    Advance Directive Documentation     Most Recent Value  Type of Advance Directive  Healthcare Power of Attorney  Pre-existing out of facility DNR order (yellow form or pink MOST form)  -  "MOST" Form in Place?  -     Family Communication: Spoke with [name] on the phone Disposition Plan: To be determined  Antibiotics:  Rocephin  Time spent: 28 minutes  [place]

## 2018-07-07 NOTE — Progress Notes (Signed)
MD notified: The patient voided an unmeasured amount of urine. Highest amount recorded after bladder scan was 66ml.

## 2018-07-07 NOTE — Plan of Care (Signed)
The patient has had a heart rate between 46-51bpm. No falls. Urinary incontinent. Bladder scan after flomax provided. Post-void residual was 75ml. IV fluids restarted.  Problem: Education: Goal: Knowledge of General Education information will improve Description Including pain rating scale, medication(s)/side effects and non-pharmacologic comfort measures Outcome: Progressing   Problem: Health Behavior/Discharge Planning: Goal: Ability to manage health-related needs will improve Outcome: Progressing   Problem: Clinical Measurements: Goal: Ability to maintain clinical measurements within normal limits will improve Outcome: Progressing Goal: Will remain free from infection Outcome: Progressing Goal: Diagnostic test results will improve Outcome: Progressing Goal: Respiratory complications will improve Outcome: Progressing Goal: Cardiovascular complication will be avoided Outcome: Progressing   Problem: Activity: Goal: Risk for activity intolerance will decrease Outcome: Progressing   Problem: Nutrition: Goal: Adequate nutrition will be maintained Outcome: Progressing   Problem: Coping: Goal: Level of anxiety will decrease Outcome: Progressing   Problem: Elimination: Goal: Will not experience complications related to bowel motility Outcome: Progressing Goal: Will not experience complications related to urinary retention Outcome: Progressing   Problem: Pain Managment: Goal: General experience of comfort will improve Outcome: Progressing   Problem: Safety: Goal: Ability to remain free from injury will improve Outcome: Progressing   Problem: Skin Integrity: Goal: Risk for impaired skin integrity will decrease Outcome: Progressing

## 2018-07-07 NOTE — Progress Notes (Addendum)
MD notified: Kyle Roberson, the patient is brady. current heart rate goes between 47-50 bpm but tele called me and indicated it dropped at one time to 37bpm.   MD has d/c BP med.

## 2018-07-08 ENCOUNTER — Inpatient Hospital Stay: Payer: Medicare HMO

## 2018-07-08 DIAGNOSIS — K567 Ileus, unspecified: Secondary | ICD-10-CM

## 2018-07-08 LAB — BASIC METABOLIC PANEL
Anion gap: 14 (ref 5–15)
BUN: 100 mg/dL — ABNORMAL HIGH (ref 8–23)
CO2: 21 mmol/L — ABNORMAL LOW (ref 22–32)
Calcium: 8.6 mg/dL — ABNORMAL LOW (ref 8.9–10.3)
Chloride: 103 mmol/L (ref 98–111)
Creatinine, Ser: 2.89 mg/dL — ABNORMAL HIGH (ref 0.61–1.24)
GFR calc Af Amer: 23 mL/min — ABNORMAL LOW (ref >60)
GFR calc non Af Amer: 20 mL/min — ABNORMAL LOW (ref >60)
Glucose, Bld: 203 mg/dL — ABNORMAL HIGH (ref 70–99)
Potassium: 4.4 mmol/L (ref 3.5–5.1)
Sodium: 138 mmol/L (ref 135–145)

## 2018-07-08 LAB — GLUCOSE, CAPILLARY
Glucose-Capillary: 167 mg/dL — ABNORMAL HIGH (ref 70–99)
Glucose-Capillary: 220 mg/dL — ABNORMAL HIGH (ref 70–99)
Glucose-Capillary: 183 mg/dL — ABNORMAL HIGH (ref 70–99)
Glucose-Capillary: 166 mg/dL — ABNORMAL HIGH (ref 70–99)

## 2018-07-08 LAB — ECHOCARDIOGRAM COMPLETE
Height: 68 in
Weight: 4000 oz

## 2018-07-08 MED ORDER — INSULIN DETEMIR 100 UNIT/ML ~~LOC~~ SOLN
10.0000 [IU] | Freq: Every day | SUBCUTANEOUS | Status: DC
  Administered 2018-07-08 – 2018-07-11 (×4): 10 [IU] via SUBCUTANEOUS
  Filled 2018-07-08 (×5): qty 0.1

## 2018-07-08 MED ORDER — FAMOTIDINE IN NACL 20-0.9 MG/50ML-% IV SOLN
20.0000 mg | INTRAVENOUS | Status: DC
  Administered 2018-07-08 – 2018-07-10 (×3): 20 mg via INTRAVENOUS
  Filled 2018-07-08 (×3): qty 50

## 2018-07-08 NOTE — Consult Note (Addendum)
[facility] SURGICAL CONSULTATION NOTE (initial) - cpt: 78588   HISTORY OF PRESENT ILLNESS (HPI):  78 y.o. male presented to [facility] ED on 04/16 for evaluation of generalized fatigue. Patient reports about a week of generalized fatigue and weakness. He was worked up in the ED for same and found to have a UTI and AKI for which he was admitted to the medicine service. This morning, the patient has an episode of significant emesis with breakfast which prompted a KUB. KUB was concerning for dilated small bowel loops and possible SBO vs ileus. He does endorse generalized abdominal pain, nausea, emesis, and frequent burping this morning. I spoke on the phone with patient's girlfriend and she reports noticing gradual abdominal distension over the last week as well. He is uncertain of any flatus recently but did endorse a BM yesterday. No other complaints of fever, chills, cough, congestion, CP, or SOB. He does have a history of inguinal hernia repair in the 1940's. No other complaints this morning.    Surgery is consulted by hospitalist physician Dr. Leslye Peer, MD in this context for evaluation and management of emesis with possible SBO vs Ileus.   PAST MEDICAL HISTORY (PMH):  Past Medical History:  Diagnosis Date  . CAD (coronary artery disease)   . Diabetes ([place])   . HLD (hyperlipidemia)   . HTN (hypertension)      PAST SURGICAL HISTORY ([place]):  Past Surgical History:  Procedure Laterality Date  . CYSTOSCOPY    . HERNIA REPAIR       MEDICATIONS:  Prior to Admission medications   Medication Sig Start Date End Date Taking? Authorizing Provider  amLODipine (NORVASC) 10 MG tablet Take 10 mg by mouth daily. 06/01/18 06/01/19 Yes [provider]  ciprofloxacin (CIPRO) 250 MG tablet Take 250 mg by mouth 2 (two) times daily. 06/30/18 07/15/18 Yes [provider]  colchicine 0.6 MG tablet Take 0.6 mg by mouth as directed. 06/01/18  Yes [provider]  diclofenac  sodium (VOLTAREN) 1 % GEL Apply 2 g topically 4 (four) times daily. 06/30/18 06/30/19 Yes [provider]  glipiZIDE (GLUCOTROL XL) 10 MG 24 hr tablet Take 10 mg by mouth daily. 06/01/18 06/01/19 Yes [provider]  insulin NPH Human (HUMULIN N,NOVOLIN N) 100 UNIT/ML injection Inject 35 Units into the skin at bedtime. 11/30/17 11/30/18 Yes [provider]  Lidocaine 4 % PTCH Place 1 patch onto the skin daily. 07/04/18  Yes [provider]  losartan-hydrochlorothiazide (HYZAAR) 100-25 MG tablet Take 1 tablet by mouth daily. 06/01/18 06/01/19 Yes [provider]  nebivolol (BYSTOLIC) 5 MG tablet Take 5 mg by mouth daily. 06/30/18  Yes [provider]  nitroGLYCERIN (NITROLINGUAL) 0.4 MG/SPRAY spray Place 1 spray under the tongue as directed. 06/30/18 06/30/19 Yes [provider]  Omega 3 1000 MG CAPS Take 1 capsule by mouth daily. 04/14/06  Yes [provider]  pravastatin (PRAVACHOL) 20 MG tablet Take 20 mg by mouth every evening. 06/01/18 06/01/19 Yes [provider]  silver sulfADIAZINE (SILVADENE) 1 % cream Apply 1 application topically 2 (two) times daily. 06/30/18 06/30/19 Yes [provider]     ALLERGIES:  Allergies  Allergen Reactions  . Atorvastatin Other (See Comments)  . Rosuvastatin Other (See Comments)  . Simvastatin Other (See Comments)     SOCIAL HISTORY:  Social History   Socioeconomic History  . Marital status: Unknown    Spouse name: Not on file  . Number of children: Not on file  .  Years of education: Not on file  . Highest education level: Not on file  Occupational History  . Not on file  Social Needs  . Financial resource strain: Not on file  . Food insecurity:    Worry: Not on file    Inability: Not on file  . Transportation needs:    Medical: Not on file    Non-medical: Not on file  Tobacco Use  . Smoking status: Former Research scientist (life sciences)  . Smokeless tobacco: Never Used  Substance and Sexual  Activity  . Alcohol use: Yes  . Drug use: Not Currently  . Sexual activity: Not on file  Lifestyle  . Physical activity:    Days per week: Not on file    Minutes per session: Not on file  . Stress: Not on file  Relationships  . Social connections:    Talks on phone: Not on file    Gets together: Not on file    Attends religious service: Not on file    Active member of club or organization: Not on file    Attends meetings of clubs or organizations: Not on file    Relationship status: Not on file  . Intimate partner violence:    Fear of current or ex partner: Not on file    Emotionally abused: Not on file    Physically abused: Not on file    Forced sexual activity: Not on file  Other Topics Concern  . Not on file  Social History Narrative  . Not on file     FAMILY HISTORY:  Family History  Problem Relation Age of Onset  . Pancreatic cancer Mother   . CAD Father   . Diabetes Brother       REVIEW OF SYSTEMS:  Review of Systems  Constitutional: Positive for malaise/fatigue. Negative for chills and fever.  HENT: Negative for congestion and sore throat.   Respiratory: Negative for cough and shortness of breath.   Cardiovascular: Negative for chest pain and palpitations.  Gastrointestinal: Positive for abdominal pain, nausea and vomiting. Negative for blood in stool, constipation and diarrhea.  Genitourinary: Negative for dysuria and urgency.  Neurological: Negative for dizziness and headaches.  All other systems reviewed and are negative.   VITAL SIGNS:  Temp:  [97.4 F (36.3 C)-97.8 F (36.6 C)] 97.4 F (36.3 C) (04/17 0458) Pulse Rate:  [46-63] 52 (04/17 0458) Resp:  [17-22] 20 (04/17 0458) BP: (117-151)/(62-66) 151/66 (04/17 0458) SpO2:  [93 %-94 %] 94 % (04/17 0458)     Height: 5\' 8"  (172.7 cm) Weight: 113.4 kg BMI (Calculated): 38.02   INTAKE/OUTPUT:  This shift: Total I/O In: -  Out: 400 [Emesis/NG output:400]  Last 2 shifts: @IOLAST2SHIFTS @   PHYSICAL  EXAM:  Physical Exam Vitals signs and nursing note reviewed.  Constitutional:      General: He is not in acute distress.    Appearance: Normal appearance. He is obese. He is not ill-appearing.     Comments: Frequent belching during interview  HENT:     Head: Normocephalic and atraumatic.  Eyes:     General: No scleral icterus.    Conjunctiva/sclera: Conjunctivae normal.  Cardiovascular:     Rate and Rhythm: Normal rate and regular rhythm.     Pulses: Normal pulses.     Heart sounds: No murmur. No friction rub. No gallop.   Pulmonary:     Effort: Pulmonary effort is normal. No respiratory distress.     Breath sounds: Normal breath sounds. No wheezing or  rhonchi.  Abdominal:     General: Abdomen is protuberant. There is distension.     Palpations: Abdomen is soft.     Tenderness: There is no abdominal tenderness. There is no guarding or rebound.     Comments: Obese, grossly distended, tympanic to palpation throughout  Genitourinary:    Comments: Deferred Musculoskeletal:     Right lower leg: No edema.     Left lower leg: No edema.  Skin:    General: Skin is warm and dry.     Coloration: Skin is not pale.  Neurological:     General: No focal deficit present.     Mental Status: He is alert. He is disoriented.  Psychiatric:        Mood and Affect: Mood normal.        Behavior: Behavior normal.       Labs:  CBC Latest Ref Rng & Units 07/07/2018 07/06/2018  WBC 4.0 - 10.5 K/uL 12.7(H) 13.8(H)  Hemoglobin 13.0 - 17.0 g/dL 10.7(L) 11.6(L)  Hematocrit 39.0 - 52.0 % 33.3(L) 36.6(L)  Platelets 150 - 400 K/uL 344 369   CMP Latest Ref Rng & Units 07/08/2018 07/07/2018 07/06/2018  Glucose 70 - 99 mg/dL 203(H) 160(H) 245(H)  BUN 8 - 23 mg/dL 100(H) 96(H) 89(H)  Creatinine 0.61 - 1.24 mg/dL 2.89(H) 3.12(H) 3.24(H)  Sodium 135 - 145 mmol/L 138 138 135  Potassium 3.5 - 5.1 mmol/L 4.4 4.3 4.3  Chloride 98 - 111 mmol/L 103 103 97(L)  CO2 22 - 32 mmol/L 21(L) 21(L) 22  Calcium 8.9 -  10.3 mg/dL 8.6(L) 8.5(L) 8.8(L)  Total Protein 6.5 - 8.1 g/dL - - 7.1  Total Bilirubin 0.3 - 1.2 mg/dL - - 0.2(L)  Alkaline Phos 38 - 126 U/L - - 99  AST 15 - 41 U/L - - 45(H)  ALT 0 - 44 U/L - - 62(H)     Imaging studies:   KUB (07/08/2018) personally reviewed showing dilated small bowel loops, and radiologist report reviewed below:  IMPRESSION: Moderate small bowel dilatation concerning for distal small bowel obstruction. Continued radiographic follow-up is recommended.   Assessment/Plan: (ICD-10's: K35.7) 78 y.o. male with abdominal distension, nausea, and emesis most likely secondary to ileus related to UTI/AKI and decreased activity over the last week. SBO is less likely given lack of risk factors and very remote surgical history.    - Will make NPO  - Given significant distension and frequent belching, will place NGT for decompression; KUB for placement  - Monitor abdominal examination; On-going bowel function  - antiemetics as needed  - No indication for surgical intervention currently   - Further management per primary team; will follow   All of the above findings and recommendations were discussed with the patient and his girlfriend via telephone, and all of patient's and his family's questions were answered to their expressed satisfaction.  Thank you for the opportunity to participate in this patient's care.   -- Edison Simon, PA-C [facility] 07/08/2018, 11:35 AM [phone] M-F: 7am - 4pm  I saw and evaluated the patient.  I agree with the above documentation, exam, and plan, which I have edited where appropriate. Fredirick Maudlin  11:47 AM

## 2018-07-08 NOTE — Progress Notes (Signed)
OT Cancellation Note  Patient Details Name: Navjot Loera MRN: [id number] DOB: May 05, 1940   Cancelled Treatment:    Reason Eval/Treat Not Completed: Patient at procedure or test/ unavailable.  Order received, chart reviewed. Pt out of room for testing. Will re-attempt OT evaluation at later time as pt is available and medically appropriate.  Jeni Salles, MPH, MS, OTR/L ascom [phone] 07/08/18, 9:11 AM

## 2018-07-08 NOTE — Evaluation (Signed)
Occupational Therapy Evaluation Patient Details Name: Kyle Roberson MRN: [id number] DOB: 02/05/1941 Today's Date: 07/08/2018    History of Present Illness Pt is a 78 y.o. male presenting to hospital 07/06/18 with weakness (for about 5 months) and slurred speech.  Pt admitted with acute kidney injury on CKD, acute cystitis, R ankle pain (possibly gout), and a-fib.  PMH includes CAD, DM, htn.   Clinical Impression   Pt seen for OT evaluation this date. Prior to hospital admission, pt was requiring assist for ADL and was limited with mobility for the past month. Pt lives with his girlfriend, Jeannene Patella. Prior to 1 month ago, pt was independent with ADL and IADL. Currently pt demonstrates impairments in activity tolerance, strength, ROM (2nd and 3rd fingers on R hand finger flexion - developed over the past couple weeks) requiring significant assist for LB ADL from bed level. Pt noted to have 89% O2 on RA. RN notified and applied 2L O2 via nasal cannula. O2 sats improved to 93%. Despite extensive encouragement, pt declined OOB 2/2 fatigue, 10/10 abdominal pain, and nausea. Of note, pt now has NG tube with suction for possible SBO. Pt would benefit from skilled OT to address noted impairments and functional limitations (see below for any additional details) in order to maximize safety and independence while minimizing falls risk and caregiver burden.  Upon hospital discharge, recommend pt discharge to [facility] [facility].    Follow Up Recommendations  SNF    Equipment Recommendations  Other (comment)(TBD)    Recommendations for Other Services       Precautions / Restrictions Precautions Precautions: Fall Precaution Comments: monitor HR, NG tube (have nursing disconnect from suction before moving) Restrictions Weight Bearing Restrictions: No      Mobility Bed Mobility               General bed mobility comments: deferred, pt declines 2/2 fatigue and 10/10 abdominal pain  Transfers                  General transfer comment: deferred, pt declines 2/2 fatigue and 10/10 abdominal pain    Balance                                           ADL either performed or assessed with clinical judgement   ADL Overall ADL's : Needs assistance/impaired Eating/Feeding: NPO Eating/Feeding Details (indicate cue type and reason): NPO right now, but could physically feed himself Grooming: Bed level;Independent   Upper Body Bathing: Bed level;Minimal assistance;Moderate assistance   Lower Body Bathing: Bed level;Maximal assistance   Upper Body Dressing : Bed level;Moderate assistance;Minimal assistance   Lower Body Dressing: Bed level;Maximal assistance                       Vision Patient Visual Report: No change from baseline       Perception     Praxis      Pertinent Vitals/Pain Pain Assessment: 0-10 Pain Score: 10-Worst pain ever Pain Location: abdomen Pain Descriptors / Indicators: Aching Pain Intervention(s): Limited activity within patient's tolerance;Monitored during session;Repositioned     Hand Dominance Right   Extremity/Trunk Assessment Upper Extremity Assessment Upper Extremity Assessment: Generalized weakness;RUE deficits/detail;LUE deficits/detail RUE Deficits / Details: pt unable to bend 2nd and 3rd fingers into flexion (pt reporting d/t OA and gout); at least 3/5 AROM elbow flexion/extension and shoulder flexion to 90  degrees RUE Coordination: decreased fine motor LUE Deficits / Details: good hand grip strength; at least 3/5 AROM shoulder flexion (to grossly 90 degrees) and elbow flexion/extension   Lower Extremity Assessment Lower Extremity Assessment: Generalized weakness   Cervical / Trunk Assessment Cervical / Trunk Assessment: Normal   Communication Communication Communication: No difficulties   Cognition Arousal/Alertness: Awake/alert Behavior During Therapy: WFL for tasks assessed/performed Overall Cognitive Status:  Within Functional Limits for tasks assessed                                     General Comments  NG tube in place    Exercises     Shoulder Instructions      Home Living Family/patient expects to be discharged to:: Private residence Living Arrangements: Spouse/significant other(pt's girlfriend "[name]") Available Help at Discharge: Friend(s) Type of Home: House Home Access: Stairs to enter Technical brewer of Steps: 3 Entrance Stairs-Rails: None Home Layout: Two level(lives on main level (also has basement))     Bathroom Shower/Tub: Walk-in shower;Tub/shower unit   Bathroom Toilet: Handicapped height     Home Equipment: Grab bars - tub/shower;Walker - 4 wheels;Cane - single point;Shower seat - built in          Prior Functioning/Environment Level of Independence: Needs assistance  Gait / Transfers Assistance Needed: Pt reports last time he stood was last week and needed assist to stand and get to chair (pt reports he has been unable to walk recently but pt did not state when he last was able to walk) ADL's / Homemaking Assistance Needed: before 1 mo ago, pt was indep with ADL; since has required some intermittent assist from girlfriend for ADL; hasn't driven in about 1 mo            OT Problem List: Decreased strength;Decreased knowledge of use of DME or AE;Obesity;Decreased activity tolerance;Cardiopulmonary status limiting activity;Impaired UE functional use;Pain;Impaired balance (sitting and/or standing)      OT Treatment/Interventions: Self-care/ADL training;Balance training;Therapeutic exercise;Therapeutic activities;DME and/or AE instruction;Patient/family education    OT Goals(Current goals can be found in the care plan section) Acute Rehab OT Goals Patient Stated Goal: have less abdominal pain and get better OT Goal Formulation: With patient Time For Goal Achievement: 07/22/18 Potential to Achieve Goals: Good  OT Frequency: Min 2X/week    Barriers to D/C:            Co-evaluation              AM-PAC OT "6 Clicks" Daily Activity     Outcome Measure Help from another person eating meals?: None Help from another person taking care of personal grooming?: None Help from another person toileting, which includes using toliet, bedpan, or urinal?: Total Help from another person bathing (including washing, rinsing, drying)?: A Lot Help from another person to put on and taking off regular upper body clothing?: A Lot Help from another person to put on and taking off regular lower body clothing?: A Lot 6 Click Score: 15   End of Session Nurse Communication: Other (comment)(O2 sats 89% on RA, RN connected 2L O2 via [place])  Activity Tolerance: Patient limited by pain;Patient limited by fatigue Patient left: in bed;with call bell/phone within reach;with bed alarm set  OT Visit Diagnosis: Other abnormalities of gait and mobility (R26.89);Pain;Muscle weakness (generalized) (M62.81) Pain - part of body: (abdominal pain)  Time: 4712-5271 OT Time Calculation (min): 22 min Charges:  OT General Charges $OT Visit: 1 Visit OT Evaluation $OT Eval Moderate Complexity: 1 Mod  Jeni Salles, MPH, MS, OTR/L ascom [phone] 07/08/18, 3:16 PM

## 2018-07-08 NOTE — Progress Notes (Signed)
Patient ID: Kyle Roberson, male   DOB: May 14, 1940, 78 y.o.   MRN: [id number]  Updated [name] on small bowel obstruction versus ileus with nausea and vomiting earlier.

## 2018-07-08 NOTE — Plan of Care (Signed)
The patient vomited 475ml this morning during around with the MD. EKG ordered for irregular heart rate. MD has seen EKG results. Abd x-ray ordered. Images showed small bowel obstruction. Surgery consulted. NG tube order. NG tube placed at 65cm. 61ml drained from NG tube this morning. Voiding. Refused to work with PT.  Problem: Education: Goal: Knowledge of General Education information will improve Description Including pain rating scale, medication(s)/side effects and non-pharmacologic comfort measures Outcome: Progressing   Problem: Health Behavior/Discharge Planning: Goal: Ability to manage health-related needs will improve Outcome: Progressing   Problem: Clinical Measurements: Goal: Ability to maintain clinical measurements within normal limits will improve Outcome: Progressing Goal: Will remain free from infection Outcome: Progressing Goal: Diagnostic test results will improve Outcome: Progressing Goal: Respiratory complications will improve Outcome: Progressing Goal: Cardiovascular complication will be avoided Outcome: Progressing   Problem: Activity: Goal: Risk for activity intolerance will decrease Outcome: Progressing   Problem: Nutrition: Goal: Adequate nutrition will be maintained Outcome: Progressing   Problem: Coping: Goal: Level of anxiety will decrease Outcome: Progressing   Problem: Elimination: Goal: Will not experience complications related to bowel motility Outcome: Progressing Goal: Will not experience complications related to urinary retention Outcome: Progressing   Problem: Pain Managment: Goal: General experience of comfort will improve Outcome: Progressing   Problem: Safety: Goal: Ability to remain free from injury will improve Outcome: Progressing   Problem: Skin Integrity: Goal: Risk for impaired skin integrity will decrease Outcome: Progressing

## 2018-07-08 NOTE — Care Management Important Message (Signed)
Important Message  Patient Details  Name: Kyle Roberson MRN: [id number] Date of Birth: 08/29/1940   Medicare Important Message Given:  Yes    Dannette Barbara 07/08/2018, 11:43 AM

## 2018-07-08 NOTE — Progress Notes (Signed)
MD notified: The patient got and NG tube inserted. Placement has been checked by x-ray. Would you like to switch medications to IV.

## 2018-07-08 NOTE — Progress Notes (Signed)
PT Cancellation Note  Patient Details Name: Kyle Roberson MRN: [id number] DOB: [birth year]   Cancelled Treatment:    Reason Eval/Treat Not Completed: Patient declined, no reason specified(patient reports he is not feeling up to it. Educated patient about importance of mobilizing to optimize recovery and ability to return home)  [name]. Graylon Good, PT, DPT 07/08/18, 4:04 PM

## 2018-07-08 NOTE — Progress Notes (Signed)
Patient ID: Kyle Roberson, male   DOB: July 06, 1940, 78 y.o.   MRN: [id number]  [employer] PROGRESS NOTE  Kyle Roberson DJS:[id number] DOB: 1940-04-23 DOA: 07/06/2018 PCP: Patient, No Pcp Per  HPI/Subjective: I came into the room and the patient stated he is feeling sick.  Patient started vomiting multiple times the cranberry juice that he was drinking.  No complaints of abdominal pain chest pain or shortness of breath.  Objective: Vitals:   07/07/18 2037 07/08/18 0458  BP: 119/62 (!) 151/66  Pulse: 63 (!) 52  Resp: (!) 22 20  Temp: 97.7 F (36.5 C) (!) 97.4 F (36.3 C)  SpO2: 94% 94%    Filed Weights   07/06/18 1835  Weight: 113.4 kg    ROS: Review of Systems  Unable to perform ROS: Acuity of condition  Respiratory: Negative for shortness of breath.   Cardiovascular: Negative for chest pain.  Gastrointestinal: Positive for nausea and vomiting. Negative for abdominal pain.   Exam: Physical Exam  HENT:  Nose: No mucosal edema.  Mouth/Throat: No oropharyngeal exudate or posterior oropharyngeal edema.  Eyes: Pupils are equal, round, and reactive to light. Conjunctivae, EOM and lids are normal.  Neck: No JVD present. Carotid bruit is not present. No edema present. No thyroid mass and no thyromegaly present.  Cardiovascular: S1 normal and S2 normal. An irregularly irregular rhythm present. Exam reveals no gallop.  No murmur heard. Pulses:      Dorsalis pedis pulses are 2+ on the right side and 2+ on the left side.  Respiratory: No respiratory distress. He has no wheezes. He has no rhonchi. He has no rales.  GI: Soft. He exhibits distension. Bowel sounds are decreased. There is no abdominal tenderness.  Musculoskeletal:     Right ankle: He exhibits decreased range of motion and swelling.     Left ankle: He exhibits swelling.  Lymphadenopathy:    He has no cervical adenopathy.  Neurological: He is alert. No cranial nerve deficit.  Skin: Skin is warm. No rash  noted. Nails show no clubbing.  Psychiatric: He has a normal mood and affect.      Data Reviewed: Basic Metabolic Panel: Recent Labs  Lab 07/06/18 1903 07/07/18 0443 07/08/18 0543  NA 135 138 138  K 4.3 4.3 4.4  CL 97* 103 103  CO2 22 21* 21*  GLUCOSE 245* 160* 203*  BUN 89* 96* 100*  CREATININE 3.24* 3.12* 2.89*  CALCIUM 8.8* 8.5* 8.6*   Liver Function Tests: Recent Labs  Lab 07/06/18 1903  AST 45*  ALT 62*  ALKPHOS 99  BILITOT 0.2*  PROT 7.1  ALBUMIN 2.9*   CBC: Recent Labs  Lab 07/06/18 1903 07/07/18 0443  WBC 13.8* 12.7*  NEUTROABS 10.3*  --   HGB 11.6* 10.7*  HCT 36.6* 33.3*  MCV 88.0 86.3  PLT 369 344   Cardiac Enzymes: Recent Labs  Lab 07/06/18 1903  TROPONINI 0.03*   CBG: Recent Labs  Lab 07/07/18 0805 07/07/18 1128 07/07/18 1701 07/07/18 2118 07/08/18 0733  GLUCAP 157* 291* 216* 241* 167*     Studies: Ct Head Wo Contrast  Result Date: 07/07/2018 CLINICAL DATA:  Slurred speech EXAM: CT HEAD WITHOUT CONTRAST TECHNIQUE: Contiguous axial images were obtained from the base of the skull through the vertex without intravenous contrast. COMPARISON:  None. FINDINGS: Brain: There is mild to moderate generalized atrophy. There is no intracranial mass, hemorrhage, extra-axial fluid collection, or midline shift. There is slight small vessel disease in the centra semiovale bilaterally.  No acute infarct is evident on this study. Vascular: No hyperdense vessel. There is calcification in each carotid siphon region. Skull: The bony calvarium appears intact. Sinuses/Orbits: There is mucosal thickening in several ethmoid air cells. Orbits appear symmetric bilaterally. Other: Visualized mastoid air cells are clear. IMPRESSION: Atrophy with slight periventricular small vessel disease. No acute infarct. No mass or hemorrhage. There are foci of arterial vascular calcification. There is mucosal thickening in several ethmoid air cells. Electronically Signed   By:  Lowella Grip III M.D.   On: 07/07/2018 11:07   US Renal  Result Date: 07/07/2018 CLINICAL DATA:  Acute kidney injury. EXAM: RENAL / URINARY TRACT ULTRASOUND COMPLETE COMPARISON:  None. FINDINGS: Right Kidney: Renal measurements: 13.5 x 6.8 x 6.2 cm = volume: 298 mL. Lobular cortex is noted concerning for scarring. Echogenicity within normal limits. No mass or hydronephrosis visualized. Left Kidney: Renal measurements: 14.8 x 7.1 x 6.4 cm = volume: 354 mL. Lobular cortex is noted concerning for scarring. Echogenicity within normal limits. No mass or hydronephrosis visualized. Bladder: Appears normal for degree of bladder distention. IMPRESSION: Probable bilateral renal cortical scarring is noted. No hydronephrosis or renal obstruction is noted. Electronically Signed   By: Marijo Conception M.D.   On: 07/07/2018 11:22   Dg Chest Port 1 View  Result Date: 07/06/2018 CLINICAL DATA:  78 year old male with a history of difficulty standing EXAM: PORTABLE CHEST 1 VIEW COMPARISON:  None FINDINGS: Cardiomediastinal silhouette borderline enlarged, accentuated by low lung volumes. Linear opacities at the lung bases. No pneumothorax or large pleural effusion. No confluent airspace disease. IMPRESSION: Low lung volumes with likely atelectasis/scarring, and no evidence of acute cardiopulmonary disease. Electronically Signed   By: Corrie Mckusick D.O.   On: 07/06/2018 19:53    Scheduled Meds: . amLODipine  10 mg Oral Daily  . aspirin EC  81 mg Oral Daily  . heparin  5,000 Units Subcutaneous Q8H  . insulin aspart  0-5 Units Subcutaneous QHS  . insulin aspart  0-9 Units Subcutaneous TID WC  . insulin detemir  20 Units Subcutaneous QHS  . omega-3 acid ethyl esters  1 g Oral Daily  . pravastatin  20 mg Oral QPM  . tamsulosin  0.4 mg Oral QPC breakfast   Continuous Infusions: . sodium chloride 50 mL/hr at 07/08/18 0503  . cefTRIAXone (ROCEPHIN)  IV Stopped (07/07/18 2102)  . famotidine (PEPCID) IV       Assessment/Plan:  1. Nausea vomiting.  Looks like patient also has abdominal distention.  Will start off with a flat and upright x-ray.  Give IV Zofran.  Give IV Pepcid.  Make n.p.o. for right now.  Need to settle down nausea vomiting first.   2. 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  IV fluid hydration.  Creatinine with very slow improvement down to 2.89.  Continue gentle fluids. 3. Acute cystitis.  Repeat urine culture ordered because urine culture shows multiple organisms.  Continue Rocephin. 4. Right ankle pain.  Possible gout.  Uric acid level high.  Will need allopurinol at some point.  No pain in right ankle at this point.  Able to move the ankle around better.  Since he is having nausea vomiting I will not repeat medications today. 5. Type 2 diabetes mellitus on detemir insulin and sliding scale. 6. BPH start Flomax.  Check urine post void residual by bladder scan. 7. Hypertension on amlodipine 8. Atrial fibrillation with bradycardia.  Stopped Bystolic.  Echocardiogram still pending.  I  started on aspirin but may not be able to take this morning. 9. Significant other noticed some thickening of his speech.  CT scan of the head negative. 10. Weakness.  Physical therapy recommended rehab  Code Status:     Code Status Orders  (From admission, onward)         Start     Ordered   07/06/18 2254  Full code  Continuous     07/06/18 2253        Code Status History    This patient has a current code status but no historical code status.    Advance Directive Documentation     Most Recent Value  Type of Advance Directive  Healthcare Power of Attorney  Pre-existing out of facility DNR order (yellow form or pink MOST form)  -  "MOST" Form in Place?  -     Family Communication: Spoke with [name] on the phone Disposition Plan: To be determined  Antibiotics:  Rocephin  Time spent: 27 minutes  [place]

## 2018-07-09 DIAGNOSIS — R112 Nausea with vomiting, unspecified: Secondary | ICD-10-CM

## 2018-07-09 LAB — BASIC METABOLIC PANEL
Anion gap: 10 (ref 5–15)
BUN: 93 mg/dL — ABNORMAL HIGH (ref 8–23)
CO2: 24 mmol/L (ref 22–32)
Calcium: 8.5 mg/dL — ABNORMAL LOW (ref 8.9–10.3)
Chloride: 108 mmol/L (ref 98–111)
Creatinine, Ser: 2.38 mg/dL — ABNORMAL HIGH (ref 0.61–1.24)
GFR calc Af Amer: 29 mL/min — ABNORMAL LOW (ref >60)
GFR calc non Af Amer: 25 mL/min — ABNORMAL LOW (ref >60)
Glucose, Bld: 149 mg/dL — ABNORMAL HIGH (ref 70–99)
Potassium: 4 mmol/L (ref 3.5–5.1)
Sodium: 142 mmol/L (ref 135–145)

## 2018-07-09 LAB — URINE CULTURE: Culture: NO GROWTH

## 2018-07-09 LAB — GLUCOSE, CAPILLARY
Glucose-Capillary: 142 mg/dL — ABNORMAL HIGH (ref 70–99)
Glucose-Capillary: 136 mg/dL — ABNORMAL HIGH (ref 70–99)
Glucose-Capillary: 132 mg/dL — ABNORMAL HIGH (ref 70–99)
Glucose-Capillary: 112 mg/dL — ABNORMAL HIGH (ref 70–99)
Glucose-Capillary: 242 mg/dL — ABNORMAL HIGH (ref 70–99)

## 2018-07-09 NOTE — TOC Initial Note (Signed)
Transition of Care [facility]) - Initial/Assessment Note    Patient Details  Name: Kyle Roberson MRN: [id number] Date of Birth: 1941/03/19  Transition of Care [facility] CM/SW Contact:    Weston Anna, LCSW Phone Number: 07/09/2018, 11:30 AM  Clinical Narrative: CSW spoke with patient via phone call to determine disposition plans. CSW informed patient of PT's current recommendation for SNF placement at discharge. Patient was adamant that he did not want to go to SNF at this time. Patient stated "im not going to a facility with all this stuff going on and people getting sick"- CSW voiced understanding of his concerns for going into a new skilled nursing facility. Patient is agreeable to home health at this time however has not yet selected preference. Patient requested TOC team to discuss options with girlfriend, [name]. RNCM to follow up for any needs for [facility].                 Expected Discharge Plan: [place] Barriers to Discharge: No Barriers Identified   Patient Goals and CMS Choice Patient states their goals for this hospitalization and ongoing recovery are:: Getting back home with [name] (girlfriend)   Choice offered to / list presented to : Patient(Patient and patients girlfriend pam)  Expected Discharge Plan and Services Expected Discharge Plan: [place] In-house Referral: NA Discharge Planning Services: CM Consult Post Acute Care Choice: [place] arrangements for the past 2 months: Single Family Home                          Prior Living Arrangements/Services Living arrangements for the past 2 months: Single Family Home Lives with:: Significant Other([name]) Patient language and need for interpreter reviewed:: No Do you feel safe going back to the place where you live?: Yes      Need for Family Participation in Patient Care: Yes (Comment) Care giver support system in place?: Yes (comment) Current home services: (RNCM following  up) Criminal Activity/Legal Involvement Pertinent to Current Situation/Hospitalization: No - Comment as needed  Activities of Daily Living Home Assistive Devices/Equipment: Walker (specify type) ADL Screening (condition at time of admission) Patient's cognitive ability adequate to safely complete daily activities?: Yes Is the patient deaf or have difficulty hearing?: No Does the patient have difficulty seeing, even when wearing glasses/contacts?: No Does the patient have difficulty concentrating, remembering, or making decisions?: No Patient able to express need for assistance with ADLs?: Yes Does the patient have difficulty dressing or bathing?: Yes Independently performs ADLs?: No Communication: Independent Dressing (OT): Needs assistance Is this a change from baseline?: Pre-admission baseline Grooming: Needs assistance Is this a change from baseline?: Pre-admission baseline Feeding: Independent Bathing: Needs assistance Is this a change from baseline?: Pre-admission baseline Toileting: Needs assistance Is this a change from baseline?: Pre-admission baseline In/Out Bed: Needs assistance Is this a change from baseline?: Pre-admission baseline Walks in Home: Needs assistance Is this a change from baseline?: Pre-admission baseline Does the patient have difficulty walking or climbing stairs?: Yes Weakness of Legs: Both Weakness of Arms/Hands: Both  Permission Sought/Granted Permission sought to share information with : Case Manager, Family Supports Permission granted to share information with : Yes, Verbal Permission Granted  Share Information with NAME: Jeannene Patella [phone]           Emotional Assessment   Attitude/Demeanor/Rapport: Engaged, Apprehensive Affect (typically observed): Pleasant, Stable, Blunt Orientation: : Oriented to Place, Oriented to Self, Oriented to  Time,  Oriented to Situation Alcohol / Substance Use: Never Used Psych Involvement: No (comment)  Admission  diagnosis:  Weakness [R53.1] AKI (acute kidney injury) ([place]) [N17.9] Patient Active Problem List   Diagnosis Date Noted  . UTI (urinary tract infection) 07/06/2018  . AKI (acute kidney injury) ([place]) 07/06/2018  . HTN (hypertension) 07/06/2018  . HLD (hyperlipidemia) 07/06/2018  . CAD (coronary artery disease) 07/06/2018  . Diabetes ([place]) 07/06/2018   PCP:  Patient, No Pcp Per Pharmacy:  No Pharmacies Listed    Social Determinants of Health (SDOH) Interventions  N/a  Readmission Risk Interventions No flowsheet data found.

## 2018-07-09 NOTE — Progress Notes (Addendum)
Patient ID: Kyle Roberson, male   DOB: 09-30-1940, 78 y.o.   MRN: [id number]  [facility] PROGRESS NOTE  Kyle Roberson OFB:[id number] DOB: 03-26-1940 DOA: 07/06/2018 PCP: Patient, No Pcp Per  HPI/Subjective: Patient doing better nausea vomiting resolved NG tube clamped by surgery this morning   Objective: Vitals:   07/09/18 0837 07/09/18 1131  BP: 126/77 (!) 135/58  Pulse: (!) 49 (!) 50  Resp:    Temp:  (!) 97.4 F (36.3 C)  SpO2:  100%    Filed Weights   07/06/18 1835  Weight: 113.4 kg    ROS: Review of Systems  Unable to perform ROS: Acuity of condition  Respiratory: Negative for shortness of breath.   Cardiovascular: Negative for chest pain.  Gastrointestinal: Negative for abdominal pain, nausea and vomiting.   Exam: Physical Exam  HENT:  Nose: No mucosal edema.  Mouth/Throat: No oropharyngeal exudate or posterior oropharyngeal edema.  Eyes: Pupils are equal, round, and reactive to light. Conjunctivae, EOM and lids are normal.  Neck: No JVD present. Carotid bruit is not present. No edema present. No thyroid mass and no thyromegaly present.  Cardiovascular: S1 normal and S2 normal. An irregularly irregular rhythm present. Exam reveals no gallop.  No murmur heard. Pulses:      Dorsalis pedis pulses are 2+ on the right side and 2+ on the left side.  Respiratory: No respiratory distress. He has no wheezes. He has no rhonchi. He has no rales.  GI: Soft. He exhibits no distension. Bowel sounds are decreased. There is no abdominal tenderness.  Musculoskeletal:     Right ankle: He exhibits decreased range of motion and swelling.     Left ankle: He exhibits swelling.  Lymphadenopathy:    He has no cervical adenopathy.  Neurological: He is alert. No cranial nerve deficit.  Skin: Skin is warm. No rash noted. Nails show no clubbing.  Psychiatric: He has a normal mood and affect.      Data Reviewed: Basic Metabolic Panel: Recent Labs  Lab 07/06/18 1903  07/07/18 0443 07/08/18 0543 07/09/18 0923  NA 135 138 138 142  K 4.3 4.3 4.4 4.0  CL 97* 103 103 108  CO2 22 21* 21* 24  GLUCOSE 245* 160* 203* 149*  BUN 89* 96* 100* 93*  CREATININE 3.24* 3.12* 2.89* 2.38*  CALCIUM 8.8* 8.5* 8.6* 8.5*   Liver Function Tests: Recent Labs  Lab 07/06/18 1903  AST 45*  ALT 62*  ALKPHOS 99  BILITOT 0.2*  PROT 7.1  ALBUMIN 2.9*   CBC: Recent Labs  Lab 07/06/18 1903 07/07/18 0443  WBC 13.8* 12.7*  NEUTROABS 10.3*  --   HGB 11.6* 10.7*  HCT 36.6* 33.3*  MCV 88.0 86.3  PLT 369 344   Cardiac Enzymes: Recent Labs  Lab 07/06/18 1903  TROPONINI 0.03*   CBG: Recent Labs  Lab 07/08/18 1640 07/08/18 2116 07/09/18 0321 07/09/18 0738 07/09/18 1129  GLUCAP 183* 166* 142* 136* 132*     Studies: Dg Abd 1 View  Result Date: 07/08/2018 CLINICAL DATA:  Nasogastric tube placement. EXAM: ABDOMEN - 1 VIEW COMPARISON:  Radiographs of same day. FINDINGS: Interval placement of nasogastric tube with tip in expected position of proximal stomach. Dilated small bowel loops are noted concerning for distal small bowel obstruction. IMPRESSION: Nasogastric tube tip seen in proximal stomach. Dilated small bowel loops are noted concerning for distal small bowel obstruction. Electronically Signed   By: Marijo Conception M.D.   On: 07/08/2018 12:03   Dg  Abd 2 Views  Result Date: 07/08/2018 CLINICAL DATA:  Vomiting. EXAM: ABDOMEN - 2 VIEW COMPARISON:  None. FINDINGS: Moderate small bowel dilatation is noted without colonic dilatation. This is concerning for distal small bowel obstruction. No abnormal calcifications are noted. No definite free air is noted to suggest pneumoperitoneum. IMPRESSION: Moderate small bowel dilatation concerning for distal small bowel obstruction. Continued radiographic follow-up is recommended. Electronically Signed   By: Marijo Conception M.D.   On: 07/08/2018 09:45    Scheduled Meds: . heparin  5,000 Units Subcutaneous Q8H  . insulin  aspart  0-5 Units Subcutaneous QHS  . insulin aspart  0-9 Units Subcutaneous TID WC  . insulin detemir  10 Units Subcutaneous QHS  . tamsulosin  0.4 mg Oral QPC breakfast   Continuous Infusions: . sodium chloride 50 mL/hr at 07/09/18 0535  . cefTRIAXone (ROCEPHIN)  IV Stopped (07/08/18 2159)  . famotidine (PEPCID) IV 20 mg (07/09/18 0830)    Assessment/Plan:  1. Small bowel obstruction/partial small bowel obstruction continue supportive care surgery following likely removal of NG tube 2. 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  Renal function continues to improve 3. Acute cystitis.  Repeat urine culture shows no growth currently change to oral antibiotics once able to take p.o. 4. Right ankle pain.  Possible gout.  Uric acid level high.  Will need allopurinol at some point.  No pain in right ankle at this point.  Able to move the ankle around better.   5. Type 2 diabetes mellitus on detemir insulin and sliding scale.  Blood sugar stable 6. BPH continue Flomax.   7. Hypertension on amlodipine 8. Atrial fibrillation with bradycardia.  Stopped Bystolic.  Echocardiogram reviewed I started on aspirin but may not be able to take this morning. 9. Significant other noticed some thickening of his speech.  CT scan of the head negative. 10. Weakness.  Physical therapy recommended rehab  Code Status:     Code Status Orders  (From admission, onward)         Start     Ordered   07/06/18 2254  Full code  Continuous     07/06/18 2253        Code Status History    This patient has a current code status but no historical code status.    Advance Directive Documentation     Most Recent Value  Type of Advance Directive  Healthcare Power of Attorney  Pre-existing out of facility DNR order (yellow form or pink MOST form)  -  "MOST" Form in Place?  -     Family Communication: Spoke with Kyle Roberson on the phone Disposition Plan: To be determined  Antibiotics:  Rocephin  Time  spent: 27 minutes  Kyle Roberson

## 2018-07-09 NOTE — Consult Note (Signed)
[facility] SURGICAL CONSULTATION NOTE    HISTORY OF PRESENT ILLNESS (HPI):  78 y.o. male presented to [facility] ED on 04/16 for evaluation of generalized fatigue. Patient reports about a week of generalized fatigue and weakness. He was worked up in the ED for same and found to have a UTI and AKI for which he was admitted to the medicine service. This morning, the patient has an episode of significant emesis with breakfast which prompted a KUB. KUB was concerning for dilated small bowel loops and possible SBO vs ileus. He does endorse generalized abdominal pain, nausea, emesis, and frequent burping this morning. I spoke on the phone with patient's girlfriend and she reports noticing gradual abdominal distension over the last week as well. He is uncertain of any flatus recently but did endorse a BM yesterday. No other complaints of fever, chills, cough, congestion, CP, or SOB. He does have a history of inguinal hernia repair in the 1940's. No other complaints this morning.    Interval history: An NG tube was placed yesterday.  Approximately 1000 cc came out immediately.  This morning, the patient states that he had a "massive fart," has been passing additional gas.  The current contents of his NG tube canister appear consistent with ice ingestion.  He has been somewhat delirious and is making incongruous statements while I am present in the room.   PAST MEDICAL HISTORY (PMH):  Past Medical History:  Diagnosis Date  . CAD (coronary artery disease)   . Diabetes ([place])   . HLD (hyperlipidemia)   . HTN (hypertension)      PAST SURGICAL HISTORY ([place]):  Past Surgical History:  Procedure Laterality Date  . CYSTOSCOPY    . HERNIA REPAIR       MEDICATIONS:  Prior to Admission medications   Medication Sig Start Date End Date Taking? Authorizing Provider  amLODipine (NORVASC) 10 MG tablet Take 10 mg by mouth daily. 06/01/18 06/01/19 Yes [provider]  ciprofloxacin (CIPRO) 250  MG tablet Take 250 mg by mouth 2 (two) times daily. 06/30/18 07/15/18 Yes [provider]  colchicine 0.6 MG tablet Take 0.6 mg by mouth as directed. 06/01/18  Yes [provider]  diclofenac sodium (VOLTAREN) 1 % GEL Apply 2 g topically 4 (four) times daily. 06/30/18 06/30/19 Yes [provider]  glipiZIDE (GLUCOTROL XL) 10 MG 24 hr tablet Take 10 mg by mouth daily. 06/01/18 06/01/19 Yes [provider]  insulin NPH Human (HUMULIN N,NOVOLIN N) 100 UNIT/ML injection Inject 35 Units into the skin at bedtime. 11/30/17 11/30/18 Yes [provider]  Lidocaine 4 % PTCH Place 1 patch onto the skin daily. 07/04/18  Yes [provider]  losartan-hydrochlorothiazide (HYZAAR) 100-25 MG tablet Take 1 tablet by mouth daily. 06/01/18 06/01/19 Yes [provider]  nebivolol (BYSTOLIC) 5 MG tablet Take 5 mg by mouth daily. 06/30/18  Yes [provider]  nitroGLYCERIN (NITROLINGUAL) 0.4 MG/SPRAY spray Place 1 spray under the tongue as directed. 06/30/18 06/30/19 Yes [provider]  Omega 3 1000 MG CAPS Take 1 capsule by mouth daily. 04/14/06  Yes [provider]  pravastatin (PRAVACHOL) 20 MG tablet Take 20 mg by mouth every evening. 06/01/18 06/01/19 Yes [provider]  silver sulfADIAZINE (SILVADENE) 1 % cream Apply 1 application topically 2 (two) times daily. 06/30/18 06/30/19 Yes [provider]     ALLERGIES:  Allergies  Allergen Reactions  . Atorvastatin Other (See Comments)  . Rosuvastatin Other (See Comments)  . Simvastatin  Other (See Comments)     SOCIAL HISTORY:  Social History   Socioeconomic History  . Marital status: Unknown    Spouse name: Not on file  . Number of children: Not on file  . Years of education: Not on file  . Highest education level: Not on file  Occupational History  . Not on file  Social Needs  . Financial resource strain: Not on file  . Food insecurity:    Worry: Not on file     Inability: Not on file  . Transportation needs:    Medical: Not on file    Non-medical: Not on file  Tobacco Use  . Smoking status: Former Research scientist (life sciences)  . Smokeless tobacco: Never Used  Substance and Sexual Activity  . Alcohol use: Yes  . Drug use: Not Currently  . Sexual activity: Not on file  Lifestyle  . Physical activity:    Days per week: Not on file    Minutes per session: Not on file  . Stress: Not on file  Relationships  . Social connections:    Talks on phone: Not on file    Gets together: Not on file    Attends religious service: Not on file    Active member of club or organization: Not on file    Attends meetings of clubs or organizations: Not on file    Relationship status: Not on file  . Intimate partner violence:    Fear of current or ex partner: Not on file    Emotionally abused: Not on file    Physically abused: Not on file    Forced sexual activity: Not on file  Other Topics Concern  . Not on file  Social History Narrative  . Not on file     FAMILY HISTORY:  Family History  Problem Relation Age of Onset  . Pancreatic cancer Mother   . CAD Father   . Diabetes Brother       REVIEW OF SYSTEMS:  Review of Systems  Constitutional: Positive for malaise/fatigue. Negative for chills and fever.  HENT: Negative for congestion and sore throat.   Respiratory: Negative for cough and shortness of breath.   Cardiovascular: Negative for chest pain and palpitations.  Gastrointestinal: Negative for abdominal pain, blood in stool, constipation, diarrhea, nausea and vomiting.  Genitourinary: Negative for dysuria and urgency.  Neurological: Negative for dizziness and headaches.  All other systems reviewed and are negative.   VITAL SIGNS:  Temp:  [97.4 F (36.3 C)-97.7 F (36.5 C)] 97.4 F (36.3 C) (04/18 0445) Pulse Rate:  [49-56] 49 (04/18 0837) Resp:  [17-20] 20 (04/18 0445) BP: (123-142)/(61-83) 126/77 (04/18 0837) SpO2:  [89 %-100 %] 100 % (04/18 0445)      Height: 5\' 8"  (172.7 cm) Weight: 113.4 kg BMI (Calculated): 38.02   I/O last 3 completed shifts: In: 2126.5 [P.O.:200; I.V.:1676.5; IV Piggyback:250] Out: 2875 [Urine:1825; Emesis/NG output:1050] No intake/output data recorded.    PHYSICAL EXAM:  Physical Exam Vitals signs and nursing note reviewed.  Constitutional:      General: He is not in acute distress.    Appearance: Normal appearance. He is obese. He is not ill-appearing.     Comments: Frequent belching during interview  HENT:     Head: Normocephalic and atraumatic.  Eyes:     General: No scleral icterus.    Conjunctiva/sclera: Conjunctivae normal.  Cardiovascular:     Rate and Rhythm: Normal rate and regular rhythm.     Pulses: Normal pulses.  Heart sounds: No murmur. No friction rub. No gallop.   Pulmonary:     Effort: Pulmonary effort is normal. No respiratory distress.     Breath sounds: Normal breath sounds. No wheezing or rhonchi.  Abdominal:     General: Abdomen is protuberant. There is no distension.     Palpations: Abdomen is soft.     Tenderness: There is no abdominal tenderness. There is no guarding or rebound.     Comments: Protuberant, consistent with his level of obesity.  Markedly softer on exam this morning without tympany.  Genitourinary:    Comments: Deferred Musculoskeletal:     Right lower leg: No edema.     Left lower leg: No edema.  Skin:    General: Skin is warm and dry.     Coloration: Skin is not pale.  Neurological:     General: No focal deficit present.     Mental Status: He is alert. He is disoriented.  Psychiatric:        Mood and Affect: Mood normal.        Behavior: Behavior normal.       Labs:  CBC Latest Ref Rng & Units 07/07/2018 07/06/2018  WBC 4.0 - 10.5 K/uL 12.7(H) 13.8(H)  Hemoglobin 13.0 - 17.0 g/dL 10.7(L) 11.6(L)  Hematocrit 39.0 - 52.0 % 33.3(L) 36.6(L)  Platelets 150 - 400 K/uL 344 369   CMP Latest Ref Rng & Units 07/08/2018 07/07/2018 07/06/2018  Glucose 70  - 99 mg/dL 203(H) 160(H) 245(H)  BUN 8 - 23 mg/dL 100(H) 96(H) 89(H)  Creatinine 0.61 - 1.24 mg/dL 2.89(H) 3.12(H) 3.24(H)  Sodium 135 - 145 mmol/L 138 138 135  Potassium 3.5 - 5.1 mmol/L 4.4 4.3 4.3  Chloride 98 - 111 mmol/L 103 103 97(L)  CO2 22 - 32 mmol/L 21(L) 21(L) 22  Calcium 8.9 - 10.3 mg/dL 8.6(L) 8.5(L) 8.8(L)  Total Protein 6.5 - 8.1 g/dL - - 7.1  Total Bilirubin 0.3 - 1.2 mg/dL - - 0.2(L)  Alkaline Phos 38 - 126 U/L - - 99  AST 15 - 41 U/L - - 45(H)  ALT 0 - 44 U/L - - 62(H)     Imaging studies:   KUB (07/08/2018) personally reviewed showing dilated small bowel loops, and radiologist report reviewed below:  IMPRESSION: Moderate small bowel dilatation concerning for distal small bowel obstruction. Continued radiographic follow-up is recommended.   Assessment/Plan: (ICD-10's: K33.7) 78 y.o. male with abdominal distension, nausea, and emesis most likely secondary to ileus related to UTI/AKI and decreased activity over the last week. SBO is less likely given lack of risk factors and very remote surgical history.  He has passed flatus this morning.   -NG tube clamping trial this morning.  Successful, may remove and initiate clear liquid diet.  - Monitor abdominal examination; On-going bowel function  - antiemetics as needed  - No indication for surgical intervention currently   - Further management per primary team; will follow   All of the above findings and recommendations were discussed with the patient and his girlfriend via telephone, and all of patient's and his family's questions were answered to their expressed satisfaction.

## 2018-07-09 NOTE — TOC Progression Note (Addendum)
Transition of Care [facility]) - Progression Note    Patient Details  Name: Kyle Roberson MRN: [id number] Date of Birth: 1941/03/22  Transition of Care [facility]) CM/SW Contact  Latanya Maudlin, RN Phone Number: 07/09/2018, 2:26 PM  Clinical Narrative:   [facility] team has been following patient following recommendations for SNF. Patient adamant about not going to a facility. Patient is agreeable to home health. CMS Medicare.gov Compare Post Acute Care list reviewed with patient and he has no preference. Referral placed with Malachy Mood with [facility] for home health services. Patient does not recall if he has access to any DME will try to contact girlfriend to assess need for rolling walker, bedside commode,etc.  Update 15:20- spoke to girlfriend [name] who is POA. She lives with patient and she has bought Radio producer, Engineer, civil (consulting), Scientist, research (medical). [name] is agreeable to home health as they have significant concerns about rehab due to [place]. [name] tells me the patient has seriously declined and they are hiring a private care aid to assist and she is hopeful he will progress enough for home health but wants to keep the door open on rehab just in case he cannot progress enough.       Expected Discharge Plan: [place] Barriers to Discharge: No Barriers Identified  Expected Discharge Plan and Services Expected Discharge Plan: [place] In-house Referral: NA Discharge Planning Services: CM Consult Post Acute Care Choice: [place] arrangements for the past 2 months: Single Family Home                     HH Arranged: RN, PT [place] Agency: [facility]   Social Determinants of Health (SDOH) Interventions    Readmission Risk Interventions Readmission Risk Prevention Plan 07/09/2018  Transportation Screening Complete  PCP or Specialist Appt within 5-7 Days Complete  Home Care Screening Complete  Medication Review (RN CM) Complete

## 2018-07-09 NOTE — Plan of Care (Signed)
Patient tolerated NGT clamping trial with zero residual  Is still having flatus.  NGT removed. Surgery will sign off.

## 2018-07-09 NOTE — Progress Notes (Signed)
Physical Therapy Treatment Patient Details Name: Kyle Roberson MRN: [id number] DOB: Aug 15, 1940 Today's Date: 07/09/2018    History of Present Illness Pt is a 78 y.o. male presenting to hospital 07/06/18 with weakness (for about 5 months) and slurred speech.  Pt admitted with acute kidney injury on CKD, acute cystitis, R ankle pain (possibly gout), and a-fib.  PMH includes CAD, DM, htn.    PT Comments    Patient needs max assist for supine to sit and sit to supine bed mobility. He reports 8/10 pain to right foot during treatment intermittently. He is unable to perform sit to stand from bed with Rw with bed elevated and max assist x 1. He needs assist to scoot back up to the head of the bed following mobility training. He will continue to benefit from skilled PT to improve mobility.   Follow Up Recommendations  SNF     Equipment Recommendations       Recommendations for Other Services       Precautions / Restrictions Restrictions Weight Bearing Restrictions: No    Mobility  Bed Mobility Overal bed mobility: Modified Independent Bed Mobility: Supine to Sit;Sit to Supine Rolling: Independent   Supine to sit: Max assist Sit to supine: Max assist      Transfers Overall transfer level: Needs assistance Equipment used: Rolling walker (2 wheeled) Transfers: Sit to/from Stand Sit to Stand: (unable to stand from edge of bed with max encouragement)         General transfer comment: (Patient is not able to stand up )  Ambulation/Gait                 Stairs             Wheelchair Mobility    Modified Rankin (Stroke Patients Only)       Balance Overall balance assessment: Modified Independent                                          Cognition Arousal/Alertness: Awake/alert Behavior During Therapy: WFL for tasks assessed/performed Overall Cognitive Status: Within Functional Limits for tasks assessed                                         Exercises      General Comments        Pertinent Vitals/Pain Pain Assessment: 0-10 Pain Score: 8  Pain Location: (right foot) Pain Descriptors / Indicators: Aching Pain Intervention(s): Limited activity within patient's tolerance    Home Living                      Prior Function            PT Goals (current goals can now be found in the care plan section) Progress towards PT goals: Not progressing toward goals - comment    Frequency    Min 2X/week      PT Plan Current plan remains appropriate    Co-evaluation              AM-PAC PT "6 Clicks" Mobility   Outcome Measure  Help needed turning from your back to your side while in a flat bed without using bedrails?: A Little Help needed moving from lying on your back to sitting on the  side of a flat bed without using bedrails?: A Lot Help needed moving to and from a bed to a chair (including a wheelchair)?: Total Help needed standing up from a chair using your arms (e.g., wheelchair or bedside chair)?: Total Help needed to walk in hospital room?: Total Help needed climbing 3-5 steps with a railing? : Total 6 Click Score: 9    End of Session Equipment Utilized During Treatment: Gait belt Activity Tolerance: Patient tolerated treatment well     PT Visit Diagnosis: Difficulty in walking, not elsewhere classified (R26.2);Pain Pain - Right/Left: Right Pain - part of body: Ankle and joints of foot     Time: 1005-1029 PT Time Calculation (min) (ACUTE ONLY): 24 min  Charges:  $Therapeutic Activity: 23-37 mins                       Alanson Puls , PT DPT 07/09/2018, 12:09 PM

## 2018-07-10 LAB — GLUCOSE, CAPILLARY
Glucose-Capillary: 153 mg/dL — ABNORMAL HIGH (ref 70–99)
Glucose-Capillary: 155 mg/dL — ABNORMAL HIGH (ref 70–99)
Glucose-Capillary: 249 mg/dL — ABNORMAL HIGH (ref 70–99)
Glucose-Capillary: 236 mg/dL — ABNORMAL HIGH (ref 70–99)
Glucose-Capillary: 315 mg/dL — ABNORMAL HIGH (ref 70–99)

## 2018-07-10 LAB — CBC
HCT: 34.6 % — ABNORMAL LOW (ref 39.0–52.0)
Hemoglobin: 10.8 g/dL — ABNORMAL LOW (ref 13.0–17.0)
MCH: 27.4 pg (ref 26.0–34.0)
MCHC: 31.2 g/dL (ref 30.0–36.0)
MCV: 87.8 fL (ref 80.0–100.0)
Platelets: 366 10*3/uL (ref 150–400)
RBC: 3.94 MIL/uL — ABNORMAL LOW (ref 4.22–5.81)
RDW: 14.2 % (ref 11.5–15.5)
WBC: 9.7 10*3/uL (ref 4.0–10.5)
nRBC: 0 % (ref 0.0–0.2)

## 2018-07-10 LAB — BASIC METABOLIC PANEL
Anion gap: 10 (ref 5–15)
BUN: 75 mg/dL — ABNORMAL HIGH (ref 8–23)
CO2: 24 mmol/L (ref 22–32)
Calcium: 8.2 mg/dL — ABNORMAL LOW (ref 8.9–10.3)
Chloride: 107 mmol/L (ref 98–111)
Creatinine, Ser: 2.08 mg/dL — ABNORMAL HIGH (ref 0.61–1.24)
GFR calc Af Amer: 34 mL/min — ABNORMAL LOW (ref >60)
GFR calc non Af Amer: 29 mL/min — ABNORMAL LOW (ref >60)
Glucose, Bld: 156 mg/dL — ABNORMAL HIGH (ref 70–99)
Potassium: 3.9 mmol/L (ref 3.5–5.1)
Sodium: 141 mmol/L (ref 135–145)

## 2018-07-10 MED ORDER — SODIUM CHLORIDE 0.9 % IV SOLN
INTRAVENOUS | Status: DC | PRN
  Administered 2018-07-10: 250 mL via INTRAVENOUS

## 2018-07-10 MED ORDER — DOCUSATE SODIUM 100 MG PO CAPS
200.0000 mg | ORAL_CAPSULE | Freq: Two times a day (BID) | ORAL | Status: DC
  Administered 2018-07-10 – 2018-07-11 (×3): 200 mg via ORAL
  Filled 2018-07-10 (×5): qty 2

## 2018-07-10 MED ORDER — PREDNISONE 50 MG PO TABS
50.0000 mg | ORAL_TABLET | Freq: Every day | ORAL | Status: DC
  Administered 2018-07-11 – 2018-07-12 (×2): 50 mg via ORAL
  Filled 2018-07-10 (×2): qty 1

## 2018-07-10 MED ORDER — COLCHICINE 0.6 MG PO TABS
0.6000 mg | ORAL_TABLET | Freq: Every day | ORAL | Status: DC
  Administered 2018-07-10 – 2018-07-12 (×3): 0.6 mg via ORAL
  Filled 2018-07-10 (×3): qty 1

## 2018-07-10 MED ORDER — POLYETHYLENE GLYCOL 3350 17 G PO PACK
17.0000 g | PACK | Freq: Every day | ORAL | Status: DC
  Administered 2018-07-10 – 2018-07-11 (×2): 17 g via ORAL
  Filled 2018-07-10 (×3): qty 1

## 2018-07-10 MED ORDER — FAMOTIDINE 20 MG PO TABS
20.0000 mg | ORAL_TABLET | Freq: Every day | ORAL | Status: DC
  Administered 2018-07-11 – 2018-07-12 (×2): 20 mg via ORAL
  Filled 2018-07-10 (×2): qty 1

## 2018-07-10 NOTE — Progress Notes (Addendum)
Patient ID: Kyle Roberson, male   DOB: 11-21-1940, 78 y.o.   MRN: [id number]  [facility] PROGRESS NOTE  Kyle Roberson LSL:[id number] DOB: [birth year] DOA: 07/06/2018 PCP: Patient, No Pcp Per  HPI/Subjective: Complains of swelling of the lower extremity bilaterally  Objective: Vitals:   07/10/18 0506 07/10/18 1147  BP: (!) 150/54 (!) 159/63  Pulse: (!) 54 (!) 53  Resp: 18 16  Temp: (!) 97.5 F (36.4 C) (!) 97.5 F (36.4 C)  SpO2: 95% 98%    Filed Weights   07/06/18 1835  Weight: 113.4 kg    ROS: Review of Systems  Unable to perform ROS: Acuity of condition  Respiratory: Negative for shortness of breath.   Cardiovascular: Positive for leg swelling. Negative for chest pain.  Gastrointestinal: Negative for abdominal pain, nausea and vomiting.   Exam: Physical Exam  HENT:  Nose: No mucosal edema.  Mouth/Throat: No oropharyngeal exudate or posterior oropharyngeal edema.  Eyes: Pupils are equal, round, and reactive to light. Conjunctivae, EOM and lids are normal.  Neck: No JVD present. Carotid bruit is not present. No edema present. No thyroid mass and no thyromegaly present.  Cardiovascular: S1 normal and S2 normal. An irregularly irregular rhythm present. Exam reveals no gallop.  No murmur heard. Pulses:      Dorsalis pedis pulses are 2+ on the right side and 2+ on the left side.  Respiratory: No respiratory distress. He has no wheezes. He has no rhonchi. He has no rales.  GI: Soft. He exhibits no distension. Bowel sounds are decreased. There is no abdominal tenderness.  Musculoskeletal:     Right ankle: He exhibits decreased range of motion and swelling.     Left ankle: He exhibits swelling.  Lymphadenopathy:    He has no cervical adenopathy.  Neurological: He is alert. No cranial nerve deficit.  Skin: Skin is warm. No rash noted. Nails show no clubbing.  Psychiatric: He has a normal mood and affect.      Data Reviewed: Basic Metabolic Panel: Recent  Labs  Lab 07/06/18 1903 07/07/18 0443 07/08/18 0543 07/09/18 0923 07/10/18 0439  NA 135 138 138 142 141  K 4.3 4.3 4.4 4.0 3.9  CL 97* 103 103 108 107  CO2 22 21* 21* 24 24  GLUCOSE 245* 160* 203* 149* 156*  BUN 89* 96* 100* 93* 75*  CREATININE 3.24* 3.12* 2.89* 2.38* 2.08*  CALCIUM 8.8* 8.5* 8.6* 8.5* 8.2*   Liver Function Tests: Recent Labs  Lab 07/06/18 1903  AST 45*  ALT 62*  ALKPHOS 99  BILITOT 0.2*  PROT 7.1  ALBUMIN 2.9*   CBC: Recent Labs  Lab 07/06/18 1903 07/07/18 0443 07/10/18 0439  WBC 13.8* 12.7* 9.7  NEUTROABS 10.3*  --   --   HGB 11.6* 10.7* 10.8*  HCT 36.6* 33.3* 34.6*  MCV 88.0 86.3 87.8  PLT 369 344 366   Cardiac Enzymes: Recent Labs  Lab 07/06/18 1903  TROPONINI 0.03*   CBG: Recent Labs  Lab 07/09/18 1615 07/09/18 2118 07/10/18 0353 07/10/18 0749 07/10/18 1142  GLUCAP 112* 242* 153* 155* 249*     Studies: No results found.  Scheduled Meds: . colchicine  0.6 mg Oral Daily  . docusate sodium  200 mg Oral BID  . heparin  5,000 Units Subcutaneous Q8H  . insulin aspart  0-5 Units Subcutaneous QHS  . insulin aspart  0-9 Units Subcutaneous TID WC  . insulin detemir  10 Units Subcutaneous QHS  . polyethylene glycol  17 g Oral  Daily  . [START ON 07/11/2018] predniSONE  50 mg Oral Q breakfast  . tamsulosin  0.4 mg Oral QPC breakfast   Continuous Infusions: . cefTRIAXone (ROCEPHIN)  IV 1 g (07/09/18 2100)  . famotidine (PEPCID) IV 20 mg (07/10/18 0901)    Assessment/Plan:  1. Small bowel obstruction/partial small bowel obstruction resolved NG tube out , start Colace and MiraLAX 2. acute kidney injury on chronic kidney disease stage III.  Renal function continues to improve 3. Acute cystitis.  Continue oral antibiotics  4. right ankle pain.  Possible gout.  Uric acid level high.  Low-dose colchicine and prednisone  5.  type 2 diabetes mellitus on detemir insulin and sliding scale.  Blood sugar stable 6. BPH continue Flomax.    7. Hypertension on amlodipine 8. Atrial fibrillation with bradycardia.  Stopped Bystolic.  Echocardiogram reviewed I continue aspirin 9. Weakness.  Physical therapy recommended rehab  Code Status:     Code Status Orders  (From admission, onward)         Start     Ordered   07/06/18 2254  Full code  Continuous     07/06/18 2253        Code Status History    This patient has a current code status but no historical code status.    Advance Directive Documentation     Most Recent Value  Type of Advance Directive  Healthcare Power of Attorney  Pre-existing out of facility DNR order (yellow form or pink MOST form)  -  "MOST" Form in Place?  -     Family Communication: Spoke with [name] on the phone Disposition Plan: To be determined  Antibiotics:  Rocephin  Time spent: 27 minutes  [place]

## 2018-07-10 NOTE — NC FL2 (Signed)
[place] LEVEL OF CARE SCREENING TOOL     IDENTIFICATION  Patient Name: Kyle Roberson Birthdate: 1940/06/30 Sex: male Admission Date (Current Location): 07/06/2018  [place] and [place] Number:  Engineering geologist and Address:  [facility], [address] [place], [place] [address]      Provider Number: [id number]  Attending Physician Name and Address:  Dustin Flock, MD  Relative Name and Phone Number:  Jeannene Patella [phone]    Current Level of Care: Hospital Recommended Level of Care: [place] Prior Approval Number:    Date Approved/Denied:   PASRR Number: [id number]  Discharge Plan: SNF    Current Diagnoses: Patient Active Problem List   Diagnosis Date Noted  . UTI (urinary tract infection) 07/06/2018  . AKI (acute kidney injury) ([place]) 07/06/2018  . HTN (hypertension) 07/06/2018  . HLD (hyperlipidemia) 07/06/2018  . CAD (coronary artery disease) 07/06/2018  . Diabetes ([place]) 07/06/2018    Orientation RESPIRATION BLADDER Height & Weight     Self, Time, Situation, Place  Normal Continent Weight: 113.4 kg Height:  5\' 8"  (172.7 cm)  BEHAVIORAL SYMPTOMS/MOOD NEUROLOGICAL BOWEL NUTRITION STATUS      Continent Diet  AMBULATORY STATUS COMMUNICATION OF NEEDS Skin   Independent Verbally Bruising                       Personal Care Assistance Level of Assistance  Bathing, Feeding, Dressing, Total care Bathing Assistance: Maximum assistance Feeding assistance: Independent Dressing Assistance: Maximum assistance Total Care Assistance: Maximum assistance   Functional Limitations Info  Sight, Hearing, Speech Sight Info: Adequate Hearing Info: Adequate Speech Info: Adequate    SPECIAL CARE FACTORS FREQUENCY  PT (By licensed PT), OT (By licensed OT)     PT Frequency: 5x per week OT Frequency: 5x per week            Contractures Contractures Info: Not present    Additional Factors Info                  Current Medications (07/10/2018):  This is the current hospital active medication list Current Facility-Administered Medications  Medication Dose Route Frequency Provider Last Rate Last Dose  . acetaminophen (TYLENOL) tablet 650 mg  650 mg Oral Q6H PRN Lance Coon, MD   650 mg at 07/09/18 2223   Or  . acetaminophen (TYLENOL) suppository 650 mg  650 mg Rectal Q6H PRN Lance Coon, MD      . alum & mag hydroxide-simeth (MAALOX/MYLANTA) 200-200-20 MG/5ML suspension 15 mL  15 mL Oral Q4H PRN Lance Coon, MD   15 mL at 07/08/18 5364  . calcium carbonate (TUMS - dosed in mg elemental calcium) chewable tablet 400 mg of elemental calcium  2 tablet Oral Q6H PRN Mayo, Pete Pelt, MD   400 mg of elemental calcium at 07/08/18 0511  . cefTRIAXone (ROCEPHIN) 1 g in sodium chloride 0.9 % 100 mL IVPB  1 g Intravenous Q24H Lance Coon, MD 200 mL/hr at 07/09/18 2100 1 g at 07/09/18 2100  . colchicine tablet 0.6 mg  0.6 mg Oral Daily Dustin Flock, MD   0.6 mg at 07/10/18 1156  . docusate sodium (COLACE) capsule 200 mg  200 mg Oral BID Dustin Flock, MD   200 mg at 07/10/18 1034  . [START ON 07/11/2018] famotidine (PEPCID) tablet 20 mg  20 mg Oral Daily Dustin Flock, MD      . heparin injection 5,000 Units  5,000 Units Subcutaneous  Driscilla Moats, MD   5,000 Units at 07/10/18 1149  . insulin aspart (novoLOG) injection 0-5 Units  0-5 Units Subcutaneous QHS Lance Coon, MD   2 Units at 07/09/18 2120  . insulin aspart (novoLOG) injection 0-9 Units  0-9 Units Subcutaneous TID WC Lance Coon, MD   3 Units at 07/10/18 1148  . insulin detemir (LEVEMIR) injection 10 Units  10 Units Subcutaneous QHS Loletha Grayer, MD   10 Units at 07/09/18 2121  . ondansetron (ZOFRAN) tablet 4 mg  4 mg Oral Q6H PRN Lance Coon, MD       Or  . ondansetron [facility]) injection 4 mg  4 mg Intravenous Q6H PRN Lance Coon, MD   4 mg at 07/08/18 0846  . polyethylene glycol (MIRALAX / GLYCOLAX) packet 17  g  17 g Oral Daily Dustin Flock, MD   17 g at 07/10/18 1035  . [START ON 07/11/2018] predniSONE (DELTASONE) tablet 50 mg  50 mg Oral Q breakfast Dustin Flock, MD      . tamsulosin [facility]) capsule 0.4 mg  0.4 mg Oral QPC breakfast Loletha Grayer, MD   0.4 mg at 07/10/18 1771     Discharge Medications: Please see discharge summary for a list of discharge medications.  Relevant Imaging Results:  Relevant Lab Results:   Additional Information SS # [id number]  Latanya Maudlin, RN

## 2018-07-10 NOTE — Progress Notes (Signed)
PHARMACIST - PHYSICIAN COMMUNICATION  DR:   Posey Pronto  CONCERNING: IV to Oral Route Change Policy  RECOMMENDATION: This patient is receiving Famotidine by the intravenous route.  Based on criteria approved by the Pharmacy and Therapeutics Committee, the intravenous medication(s) is/are being converted to the equivalent oral dose form(s).   DESCRIPTION: These criteria include:  The patient is eating (either orally or via tube) and/or has been taking other orally administered medications for a least 24 hours  The patient has no evidence of active gastrointestinal bleeding or impaired GI absorption (gastrectomy, short bowel, patient on TNA or NPO).  If you have questions about this conversion, please contact the Pharmacy Department  []   [phone] )  [facility] [x]   [phone] )  [facility] []   [phone] )  [facility] []   [phone] )  [facility] []   [phone] )  [place], [facility] 07/10/2018 1:50 PM

## 2018-07-11 LAB — GLUCOSE, CAPILLARY
Glucose-Capillary: 158 mg/dL — ABNORMAL HIGH (ref 70–99)
Glucose-Capillary: 182 mg/dL — ABNORMAL HIGH (ref 70–99)
Glucose-Capillary: 209 mg/dL — ABNORMAL HIGH (ref 70–99)
Glucose-Capillary: 302 mg/dL — ABNORMAL HIGH (ref 70–99)

## 2018-07-11 LAB — BASIC METABOLIC PANEL
Anion gap: 11 (ref 5–15)
BUN: 63 mg/dL — ABNORMAL HIGH (ref 8–23)
CO2: 23 mmol/L (ref 22–32)
Calcium: 8 mg/dL — ABNORMAL LOW (ref 8.9–10.3)
Chloride: 106 mmol/L (ref 98–111)
Creatinine, Ser: 1.88 mg/dL — ABNORMAL HIGH (ref 0.61–1.24)
GFR calc Af Amer: 39 mL/min — ABNORMAL LOW (ref >60)
GFR calc non Af Amer: 33 mL/min — ABNORMAL LOW (ref >60)
Glucose, Bld: 167 mg/dL — ABNORMAL HIGH (ref 70–99)
Potassium: 4.2 mmol/L (ref 3.5–5.1)
Sodium: 140 mmol/L (ref 135–145)

## 2018-07-11 MED ORDER — MAGNESIUM CITRATE PO SOLN
1.0000 | Freq: Once | ORAL | Status: AC
  Administered 2018-07-11: 1 via ORAL
  Filled 2018-07-11: qty 296

## 2018-07-11 MED ORDER — FLEET ENEMA 7-19 GM/118ML RE ENEM
1.0000 | ENEMA | Freq: Once | RECTAL | Status: AC
  Administered 2018-07-11: 1 via RECTAL

## 2018-07-11 MED ORDER — BISACODYL 10 MG RE SUPP
10.0000 mg | Freq: Every day | RECTAL | Status: DC
  Administered 2018-07-11: 10 mg via RECTAL
  Filled 2018-07-11: qty 1

## 2018-07-11 NOTE — TOC Progression Note (Signed)
Transition of Care [facility]) - Progression Note    Patient Details  Name: Kyle Roberson MRN: [id number] Date of Birth: [birth year]  Transition of Care [facility]) CM/SW Contact  Beverly Sessions, RN Phone Number: 07/11/2018, 11:42 AM  Clinical Narrative:     RNCM spoke with patient at bed side and significant other Kyle Roberson by Phone.  Bed offers presented.  [place] was selected.  Rick From [place] noticed of acceptance. He is to follow up with [employer] to see if authorization needs to be obtained.   Expected Discharge Plan: [place] Barriers to Discharge: No Barriers Identified  Expected Discharge Plan and Services Expected Discharge Plan: [place] In-house Referral: NA Discharge Planning Services: CM Consult Post Acute Care Choice: [place] arrangements for the past 2 months: Single Family Home                     HH Arranged: RN, PT [place] Agency: [place]   Social Determinants of Health (SDOH) Interventions    Readmission Risk Interventions Readmission Risk Prevention Plan 07/09/2018  Transportation Screening Complete  PCP or Specialist Appt within 5-7 Days Complete  Home Care Screening Complete  Medication Review (RN CM) Complete

## 2018-07-11 NOTE — Care Management Important Message (Signed)
Important Message  Patient Details  Name: Kyle Roberson MRN: [id number] Date of Birth: 25-Jul-1940   Medicare Important Message Given:  Yes    Dannette Barbara 07/11/2018, 11:14 AM

## 2018-07-11 NOTE — Progress Notes (Signed)
Patient ID: Kyle Roberson, male   DOB: Dec 15, 1940, 78 y.o.   MRN: [id number]  [employer] PROGRESS NOTE  Kyle Roberson GHW:[id number] DOB: [birth year] DOA: 07/06/2018 PCP: Patient, No Pcp Per  HPI/Subjective: Patient's pain in the leg improved very weak  Objective: Vitals:   07/10/18 2035 07/11/18 0536  BP: (!) 163/65 (!) 153/81  Pulse: (!) 55 (!) 57  Resp: 20 20  Temp: 97.9 F (36.6 C) 97.7 F (36.5 C)  SpO2: 95% 96%    Filed Weights   07/06/18 1835  Weight: 113.4 kg    ROS: Review of Systems  Unable to perform ROS: Acuity of condition  Respiratory: Negative for shortness of breath.   Cardiovascular: Positive for leg swelling. Negative for chest pain.  Gastrointestinal: Negative for abdominal pain, nausea and vomiting.   Exam: Physical Exam  HENT:  Nose: No mucosal edema.  Mouth/Throat: No oropharyngeal exudate or posterior oropharyngeal edema.  Eyes: Pupils are equal, round, and reactive to light. Conjunctivae, EOM and lids are normal.  Neck: No JVD present. Carotid bruit is not present. No edema present. No thyroid mass and no thyromegaly present.  Cardiovascular: S1 normal and S2 normal. An irregularly irregular rhythm present. Exam reveals no gallop.  No murmur heard. Pulses:      Dorsalis pedis pulses are 2+ on the right side and 2+ on the left side.  Respiratory: No respiratory distress. He has no wheezes. He has no rhonchi. He has no rales.  GI: Soft. He exhibits no distension. Bowel sounds are decreased. There is no abdominal tenderness.  Musculoskeletal:     Right ankle: He exhibits decreased range of motion and swelling.     Left ankle: He exhibits swelling.  Lymphadenopathy:    He has no cervical adenopathy.  Neurological: He is alert. No cranial nerve deficit.  Skin: Skin is warm. No rash noted. Nails show no clubbing.  Psychiatric: He has a normal mood and affect.      Data Reviewed: Basic Metabolic Panel: Recent Labs  Lab  07/07/18 0443 07/08/18 0543 07/09/18 0923 07/10/18 0439 07/11/18 0351  NA 138 138 142 141 140  K 4.3 4.4 4.0 3.9 4.2  CL 103 103 108 107 106  CO2 21* 21* 24 24 23   GLUCOSE 160* 203* 149* 156* 167*  BUN 96* 100* 93* 75* 63*  CREATININE 3.12* 2.89* 2.38* 2.08* 1.88*  CALCIUM 8.5* 8.6* 8.5* 8.2* 8.0*   Liver Function Tests: Recent Labs  Lab 07/06/18 1903  AST 45*  ALT 62*  ALKPHOS 99  BILITOT 0.2*  PROT 7.1  ALBUMIN 2.9*   CBC: Recent Labs  Lab 07/06/18 1903 07/07/18 0443 07/10/18 0439  WBC 13.8* 12.7* 9.7  NEUTROABS 10.3*  --   --   HGB 11.6* 10.7* 10.8*  HCT 36.6* 33.3* 34.6*  MCV 88.0 86.3 87.8  PLT 369 344 366   Cardiac Enzymes: Recent Labs  Lab 07/06/18 1903  TROPONINI 0.03*   CBG: Recent Labs  Lab 07/10/18 1142 07/10/18 1658 07/10/18 2019 07/11/18 0751 07/11/18 1203  GLUCAP 249* 236* 315* 158* 182*     Studies: No results found.  Scheduled Meds: . bisacodyl  10 mg Rectal Daily  . colchicine  0.6 mg Oral Daily  . docusate sodium  200 mg Oral BID  . famotidine  20 mg Oral Daily  . heparin  5,000 Units Subcutaneous Q8H  . insulin aspart  0-5 Units Subcutaneous QHS  . insulin aspart  0-9 Units Subcutaneous TID WC  . insulin  detemir  10 Units Subcutaneous QHS  . polyethylene glycol  17 g Oral Daily  . predniSONE  50 mg Oral Q breakfast  . sodium phosphate  1 enema Rectal Once  . tamsulosin  0.4 mg Oral QPC breakfast   Continuous Infusions: . sodium chloride 100 mL/hr at 07/11/18 0042  . cefTRIAXone (ROCEPHIN)  IV Stopped (07/10/18 2109)    Assessment/Plan:  1. Small bowel obstruction/partial small bowel obstruction resolved NG tube out , continue Colace and MiraLAX give a [place] enema 2. acute kidney injury on chronic kidney disease stage III.  Renal function continues to improve 3. Acute cystitis.  Continue oral antibiotics  4. right ankle pain.  Possible gout.  Uric acid level high.  Low-dose colchicine and prednisone  5.  type 2  diabetes mellitus on detemir insulin and sliding scale.  Blood sugar stable 6. BPH continue Flomax.   7. Hypertension on amlodipine 8. Atrial fibrillation with bradycardia.  Stopped Bystolic.  Echocardiogram reviewed I continue aspirin  9. Weakness.  Physical therapy recommended rehab  Code Status:     Code Status Orders  (From admission, onward)         Start     Ordered   07/06/18 2254  Full code  Continuous     07/06/18 2253        Code Status History    This patient has a current code status but no historical code status.    Advance Directive Documentation     Most Recent Value  Type of Advance Directive  Healthcare Power of Attorney  Pre-existing out of facility DNR order (yellow form or pink MOST form)  -  "MOST" Form in Place?  -     Family Communication: Spoke with Kyle Roberson on the phone Disposition Plan: To be determined  Antibiotics:  Rocephin  Time spent: 27 minutes  [place]

## 2018-07-11 NOTE — Progress Notes (Signed)
PT Cancellation Note  Patient Details Name: Kyle Roberson MRN: [id number] DOB: 1941-02-20   Cancelled Treatment:    Reason Eval/Treat Not Completed: Medical issues which prohibited therapy(Nurse hold) Nurse requested PT hold patient at this time as he needs to have a BM  Shelton Silvas PT, DPT Shelton Silvas 07/11/2018, 12:58 PM

## 2018-07-11 NOTE — Progress Notes (Signed)
Inpatient Diabetes Program Recommendations  AACE/ADA: New Consensus Statement on Inpatient Glycemic Control  Target Ranges:  Prepandial:   less than 140 mg/dL      [facility] postprandial:   less than 180 mg/dL (1-2 hours)      Critically ill patients:  140 - 180 mg/dL   Results for NYCERE, Kyle Roberson (MRN [id number]) as of 07/11/2018 09:53  Ref. Range 07/10/2018 07:49 07/10/2018 11:42 07/10/2018 16:58 07/10/2018 20:19 07/11/2018 07:51  Glucose-Capillary Latest Ref Range: 70 - 99 mg/dL 155 (H) 249 (H) 236 (H) 315 (H) 158 (H)   Review of Glycemic Control  Diabetes history:  Outpatient Diabetes medications: Glipizide XL 10 mg daily, NPH 35 units QHS Current orders for Inpatient glycemic control: Levemir 10 units QHS, Novolog 0-9 units TID with meals, Novolog 0-5 units QHS; Prednisone 50 mg QAM  Inpatient Diabetes Program Recommendations:   Insulin - Meal Coverage: If Prednisone is continued, please consider ordering Novolog 4 units TID with meals for meal coverage if patient eats at least 50% of meals.  Diet: Please consider discontinuing Regular diet and ordering Carb Modified diet.  Thanks, Barnie Alderman, RN, MSN, CDE Diabetes Coordinator Inpatient Diabetes Program [phone] (Team Pager from 8am to 5pm)

## 2018-07-11 NOTE — TOC Progression Note (Signed)
Transition of Care [facility]) - Progression Note    Patient Details  Name: Kyle Roberson MRN: [id number] Date of Birth: 09-30-1940  [facility]) CM/SW Contact  Beverly Sessions, RN Phone Number: 07/11/2018, 4:08 PM  Clinical Narrative:     Was notified by Liliane Channel from [employer] that since the facility was bought out, they no longer have a contract with [facility].  Liliane Channel states that [name] is working on it, but is unable to give me a time of which we would have an answer.  Liliane Channel said to check back closer to discharge.  This information was provided to Ledon Snare states they would like to go ahead and pursue [facility], then prior to discharge see if Cleaster Corin has worked out a Financial controller.  [name] states their goal is to get him to [facility], but if that isnt an option they will do [facility].   Tina with [facility] notified of bed acceptance.   Expected Discharge Plan: [place] Barriers to Discharge: No Barriers Identified  Expected Discharge Plan and Services Expected Discharge Plan: [place] In-house Referral: NA Discharge Planning Services: CM Consult Post Acute Care Choice: [place] arrangements for the past 2 months: Single Family Home                     HH Arranged: RN, PT [place] Agency: [place]   Social Determinants of Health (SDOH) Interventions    Readmission Risk Interventions Readmission Risk Prevention Plan 07/09/2018  Transportation Screening Complete  PCP or Specialist Appt within 5-7 Days Complete  Home Care Screening Complete  Medication Review (RN CM) Complete

## 2018-07-12 DIAGNOSIS — W19XXXA Unspecified fall, initial encounter: Secondary | ICD-10-CM | POA: Diagnosis not present

## 2018-07-12 DIAGNOSIS — R319 Hematuria, unspecified: Secondary | ICD-10-CM | POA: Diagnosis not present

## 2018-07-12 DIAGNOSIS — E7800 Pure hypercholesterolemia, unspecified: Secondary | ICD-10-CM | POA: Diagnosis not present

## 2018-07-12 DIAGNOSIS — E785 Hyperlipidemia, unspecified: Secondary | ICD-10-CM | POA: Diagnosis not present

## 2018-07-12 DIAGNOSIS — N179 Acute kidney failure, unspecified: Secondary | ICD-10-CM | POA: Diagnosis not present

## 2018-07-12 DIAGNOSIS — R5381 Other malaise: Secondary | ICD-10-CM | POA: Diagnosis not present

## 2018-07-12 DIAGNOSIS — E119 Type 2 diabetes mellitus without complications: Secondary | ICD-10-CM | POA: Diagnosis not present

## 2018-07-12 DIAGNOSIS — M029 Reactive arthropathy, unspecified: Secondary | ICD-10-CM | POA: Diagnosis not present

## 2018-07-12 DIAGNOSIS — D649 Anemia, unspecified: Secondary | ICD-10-CM | POA: Diagnosis not present

## 2018-07-12 DIAGNOSIS — R0602 Shortness of breath: Secondary | ICD-10-CM | POA: Diagnosis not present

## 2018-07-12 DIAGNOSIS — I2510 Atherosclerotic heart disease of native coronary artery without angina pectoris: Secondary | ICD-10-CM | POA: Diagnosis not present

## 2018-07-12 DIAGNOSIS — N189 Chronic kidney disease, unspecified: Secondary | ICD-10-CM | POA: Diagnosis not present

## 2018-07-12 DIAGNOSIS — N390 Urinary tract infection, site not specified: Secondary | ICD-10-CM | POA: Diagnosis not present

## 2018-07-12 DIAGNOSIS — N3000 Acute cystitis without hematuria: Secondary | ICD-10-CM | POA: Diagnosis not present

## 2018-07-12 DIAGNOSIS — M25571 Pain in right ankle and joints of right foot: Secondary | ICD-10-CM | POA: Diagnosis not present

## 2018-07-12 DIAGNOSIS — M6281 Muscle weakness (generalized): Secondary | ICD-10-CM | POA: Diagnosis not present

## 2018-07-12 DIAGNOSIS — K56609 Unspecified intestinal obstruction, unspecified as to partial versus complete obstruction: Secondary | ICD-10-CM | POA: Diagnosis not present

## 2018-07-12 DIAGNOSIS — M1000 Idiopathic gout, unspecified site: Secondary | ICD-10-CM | POA: Diagnosis not present

## 2018-07-12 DIAGNOSIS — Z7401 Bed confinement status: Secondary | ICD-10-CM | POA: Diagnosis not present

## 2018-07-12 DIAGNOSIS — R001 Bradycardia, unspecified: Secondary | ICD-10-CM | POA: Diagnosis not present

## 2018-07-12 DIAGNOSIS — M791 Myalgia, unspecified site: Secondary | ICD-10-CM | POA: Diagnosis not present

## 2018-07-12 DIAGNOSIS — R52 Pain, unspecified: Secondary | ICD-10-CM | POA: Diagnosis not present

## 2018-07-12 DIAGNOSIS — I10 Essential (primary) hypertension: Secondary | ICD-10-CM | POA: Diagnosis not present

## 2018-07-12 DIAGNOSIS — R41841 Cognitive communication deficit: Secondary | ICD-10-CM | POA: Diagnosis not present

## 2018-07-12 DIAGNOSIS — M2550 Pain in unspecified joint: Secondary | ICD-10-CM | POA: Diagnosis not present

## 2018-07-12 LAB — GLUCOSE, CAPILLARY
Glucose-Capillary: 183 mg/dL — ABNORMAL HIGH (ref 70–99)
Glucose-Capillary: 304 mg/dL — ABNORMAL HIGH (ref 70–99)
Glucose-Capillary: 309 mg/dL — ABNORMAL HIGH (ref 70–99)

## 2018-07-12 MED ORDER — PREDNISONE 10 MG PO TABS: 20.0000 mg | ORAL_TABLET | Freq: Every day | ORAL | 0 refills | Status: AC

## 2018-07-12 MED ORDER — CIPROFLOXACIN HCL 250 MG PO TABS: 250.0000 mg | ORAL_TABLET | Freq: Two times a day (BID) | ORAL | 0 refills | Status: AC

## 2018-07-12 MED ORDER — TAMSULOSIN HCL 0.4 MG PO CAPS: 0.4000 mg | ORAL_CAPSULE | Freq: Every day | ORAL | Status: DC

## 2018-07-12 MED ORDER — INSULIN DETEMIR 100 UNIT/ML ~~LOC~~ SOLN: 10.0000 [IU] | Freq: Every day | SUBCUTANEOUS | 11 refills | Status: DC

## 2018-07-12 NOTE — Progress Notes (Signed)
Inpatient Diabetes Program Recommendations  AACE/ADA: New Consensus Statement on Inpatient Glycemic Control  Target Ranges:  Prepandial:   less than 140 mg/dL      [facility] postprandial:   less than 180 mg/dL (1-2 hours)      Critically ill patients:  140 - 180 mg/dL  Results for ZAMAURI, NEZ (MRN [id number]) as of 07/12/2018 09:39  Ref. Range 07/11/2018 07:51 07/11/2018 12:03 07/11/2018 17:25 07/11/2018 21:11 07/12/2018 07:42  Glucose-Capillary Latest Ref Range: 70 - 99 mg/dL 158 (H) 182 (H) 209 (H) 302 (H) 183 (H)   Results for DEEP, BONAWITZ (MRN [id number]) as of 07/11/2018 09:53  Ref. Range 07/10/2018 07:49 07/10/2018 11:42 07/10/2018 16:58 07/10/2018 20:19  Glucose-Capillary Latest Ref Range: 70 - 99 mg/dL 155 (H) 249 (H) 236 (H) 315 (H)   Review of Glycemic Control  Diabetes history:  Outpatient Diabetes medications: Glipizide XL 10 mg daily, NPH 35 units QHS Current orders for Inpatient glycemic control: Levemir 10 units QHS, Novolog 0-9 units TID with meals, Novolog 0-5 units QHS; Prednisone 50 mg QAM  Inpatient Diabetes Program Recommendations:   Insulin - Meal Coverage: If Prednisone is continued, please consider ordering Novolog 3 units TID with meals for meal coverage if patient eats at least 50% of meals.  Diet: Please consider discontinuing Regular diet and ordering Carb Modified diet.  Thanks, Barnie Alderman, RN, MSN, CDE Diabetes Coordinator Inpatient Diabetes Program [phone] (Team Pager from 8am to 5pm)

## 2018-07-12 NOTE — Discharge Summary (Addendum)
[employer] [facility] [place] [facility], 78 y.o., DOB 1941/02/08, MRN [id number]. Admission date: 07/06/2018 Discharge Date 07/12/2018 Primary MD Patient, No Pcp Per Admitting Physician Lance Coon, MD  Admission Diagnosis  Weakness [R53.1] AKI (acute kidney injury) [facility]) [N17.9]  Discharge Diagnosis   Principal Problem: Small bowel obstruction/partial small bowel obstruction Acute kidney injury on chronic kidney injury disease stage III UTI Gout flare Diabetes type 2 BPH Hypertension Atrial fibrillation with bradycardia Weakness  Hospital Course  Kyle Roberson  is a 78 y.o. male who presents with chief complaint as above. Patient presents the ED with a complaint of weakness.  He states that he was previously diagnosed with a UTI but did not receive full treatment.  Patient initially was admitted for UTI and started on antibiotics.  And then subsequently patient started having abdominal distention and was diagnosed with a small bowel obstruction/partial small bowel obstruction had NG tube placed and was seen by surgery.  Patient small bowel obstruction is now resolved.  He is having good bowel movements.  His kidney injury is also improved.  Patient also complains of pain in legs and was noted to have a gout flare and treated with prednisone.  Patient is very weak and deconditioned need of rehab.    Patient will be on prednisone for the next few days for gout flare.        Consults  general surgery  Significant Tests:  See full reports for all details     Dg Abd 1 View  Result Date: 07/08/2018 CLINICAL DATA:  Nasogastric tube placement. EXAM: ABDOMEN - 1 VIEW COMPARISON:  Radiographs of same day. FINDINGS: Interval placement of nasogastric tube with tip in expected position of proximal stomach. Dilated small bowel loops are noted concerning for distal small bowel obstruction. IMPRESSION: Nasogastric tube tip seen in proximal stomach. Dilated  small bowel loops are noted concerning for distal small bowel obstruction. Electronically Signed   By: Marijo Conception M.D.   On: 07/08/2018 12:03   Ct Head Wo Contrast  Result Date: 07/07/2018 CLINICAL DATA:  Slurred speech EXAM: CT HEAD WITHOUT CONTRAST TECHNIQUE: Contiguous axial images were obtained from the base of the skull through the vertex without intravenous contrast. COMPARISON:  None. FINDINGS: Brain: There is mild to moderate generalized atrophy. There is no intracranial mass, hemorrhage, extra-axial fluid collection, or midline shift. There is slight small vessel disease in the centra semiovale bilaterally. No acute infarct is evident on this study. Vascular: No hyperdense vessel. There is calcification in each carotid siphon region. Skull: The bony calvarium appears intact. Sinuses/Orbits: There is mucosal thickening in several ethmoid air cells. Orbits appear symmetric bilaterally. Other: Visualized mastoid air cells are clear. IMPRESSION: Atrophy with slight periventricular small vessel disease. No acute infarct. No mass or hemorrhage. There are foci of arterial vascular calcification. There is mucosal thickening in several ethmoid air cells. Electronically Signed   By: Lowella Grip III M.D.   On: 07/07/2018 11:07   US Renal  Result Date: 07/07/2018 CLINICAL DATA:  Acute kidney injury. EXAM: RENAL / URINARY TRACT ULTRASOUND COMPLETE COMPARISON:  None. FINDINGS: Right Kidney: Renal measurements: 13.5 x 6.8 x 6.2 cm = volume: 298 mL. Lobular cortex is noted concerning for scarring. Echogenicity within normal limits. No mass or hydronephrosis visualized. Left Kidney: Renal measurements: 14.8 x 7.1 x 6.4 cm = volume: 354 mL. Lobular cortex is noted concerning for scarring. Echogenicity within normal limits. No mass or hydronephrosis visualized. Bladder: Appears  normal for degree of bladder distention. IMPRESSION: Probable bilateral renal cortical scarring is noted. No hydronephrosis or  renal obstruction is noted. Electronically Signed   By: Marijo Conception M.D.   On: 07/07/2018 11:22   Dg Chest Port 1 View  Result Date: 07/06/2018 CLINICAL DATA:  78 year old male with a history of difficulty standing EXAM: PORTABLE CHEST 1 VIEW COMPARISON:  None FINDINGS: Cardiomediastinal silhouette borderline enlarged, accentuated by low lung volumes. Linear opacities at the lung bases. No pneumothorax or large pleural effusion. No confluent airspace disease. IMPRESSION: Low lung volumes with likely atelectasis/scarring, and no evidence of acute cardiopulmonary disease. Electronically Signed   By: Corrie Mckusick D.O.   On: 07/06/2018 19:53   Dg Abd 2 Views  Result Date: 07/08/2018 CLINICAL DATA:  Vomiting. EXAM: ABDOMEN - 2 VIEW COMPARISON:  None. FINDINGS: Moderate small bowel dilatation is noted without colonic dilatation. This is concerning for distal small bowel obstruction. No abnormal calcifications are noted. No definite free air is noted to suggest pneumoperitoneum. IMPRESSION: Moderate small bowel dilatation concerning for distal small bowel obstruction. Continued radiographic follow-up is recommended. Electronically Signed   By: Marijo Conception M.D.   On: 07/08/2018 09:45       Today   Subjective:   Kyle Roberson  Pt doing better very weak  Objective:   Blood pressure (!) 149/51, pulse (!) 50, temperature (!) 97.5 F (36.4 C), temperature source Oral, resp. rate 18, height 5\' 8"  (1.727 m), weight 113.4 kg, SpO2 95 %.  .  Intake/Output Summary (Last 24 hours) at 07/12/2018 1114 Last data filed at 07/12/2018 0900 Gross per 24 hour  Intake 1180 ml  Output 1050 ml  Net 130 ml    Exam VITAL SIGNS: Blood pressure (!) 149/51, pulse (!) 50, temperature (!) 97.5 F (36.4 C), temperature source Oral, resp. rate 18, height 5\' 8"  (1.727 m), weight 113.4 kg, SpO2 95 %.  GENERAL:  78 y.o.-year-old patient lying in the bed with no acute distress.  EYES: Pupils equal, round,  reactive to light and accommodation. No scleral icterus. Extraocular muscles intact.  HEENT: Head atraumatic, normocephalic. Oropharynx and nasopharynx clear.  NECK:  Supple, no jugular venous distention. No thyroid enlargement, no tenderness.  LUNGS: Normal breath sounds bilaterally, no wheezing, rales,rhonchi or crepitation. No use of accessory muscles of respiration.  CARDIOVASCULAR: S1, S2 normal. No murmurs, rubs, or gallops.  ABDOMEN: Soft, nontender, nondistended. Bowel sounds present. No organomegaly or mass.  EXTREMITIES: No pedal edema, cyanosis, or clubbing.  NEUROLOGIC: Cranial nerves II through XII are intact. Muscle strength 5/5 in all extremities. Sensation intact. Gait not checked.  PSYCHIATRIC: The patient is alert and oriented x 3.  SKIN: No obvious rash, lesion, or ulcer.   Data Review     CBC w Diff:  Lab Results  Component Value Date   WBC 9.7 07/10/2018   HGB 10.8 (L) 07/10/2018   HCT 34.6 (L) 07/10/2018   PLT 366 07/10/2018   LYMPHOPCT 14 07/06/2018   MONOPCT 10 07/06/2018   EOSPCT 1 07/06/2018   BASOPCT 0 07/06/2018   CMP:  Lab Results  Component Value Date   NA 140 07/11/2018   K 4.2 07/11/2018   CL 106 07/11/2018   CO2 23 07/11/2018   BUN 63 (H) 07/11/2018   CREATININE 1.88 (H) 07/11/2018   PROT 7.1 07/06/2018   ALBUMIN 2.9 (L) 07/06/2018   BILITOT 0.2 (L) 07/06/2018   ALKPHOS 99 07/06/2018   AST 45 (H) 07/06/2018  ALT 62 (H) 07/06/2018  .  Micro Results Recent Results (from the past 240 hour(s))  Urine culture     Status: Abnormal   Collection Time: 07/06/18  7:03 PM  Result Value Ref Range Status   Specimen Description   Final    URINE, RANDOM Performed at [facility], [address] [place], [place] [address]    Special Requests   Final    NONE Performed at [facility], [address] [place], [place] [address]    Culture MULTIPLE SPECIES PRESENT, SUGGEST RECOLLECTION (A)  Final   Report Status 07/07/2018  FINAL  Final  Urine Culture     Status: None   Collection Time: 07/08/18 12:07 PM  Result Value Ref Range Status   Specimen Description   Final    URINE, RANDOM Performed at [facility], [address] [place], [place] [address]    Special Requests   Final    NONE Performed at [facility], [address], [place], [place] [address]    Culture   Final    NO GROWTH Performed at [facility], [place] [address], [place], [place] [address]    Report Status 07/09/2018 FINAL  Final        Code Status Orders  (From admission, onward)         Start     Ordered   07/06/18 2254  Full code  Continuous     07/06/18 2253        Code Status History    This patient has a current code status but no historical code status.    Advance Directive Documentation     Most Recent Value  Type of Advance Directive  Healthcare Power of Attorney  Pre-existing out of facility DNR order (yellow form or pink MOST form)  -  "MOST" Form in Place?  -           Contact information for follow-up providers    pcp Follow up in 3 week(s).            Contact information for after-discharge care    Destination    [facility] Preferred SNF .   Service:  Skilled Nursing Contact information: [address] [place] [phone]                  Discharge Medications   Allergies as of 07/12/2018      Reactions   Atorvastatin Other (See Comments)   Rosuvastatin Other (See Comments)   Simvastatin Other (See Comments)      Medication List    STOP taking these medications   insulin NPH Human 100 UNIT/ML injection Commonly known as:  NOVOLIN N   losartan-hydrochlorothiazide 100-25 MG tablet Commonly known as:  HYZAAR     TAKE these medications   amLODipine 10 MG tablet Commonly known as:  NORVASC Take 10 mg by mouth daily.   Bystolic 5 MG tablet Generic drug:  nebivolol Take 5 mg by mouth daily.    ciprofloxacin 250 MG tablet Commonly known as:  CIPRO Take 1 tablet (250 mg total) by mouth 2 (two) times daily for 5 days.   colchicine 0.6 MG tablet Take 0.6 mg by mouth as directed.   diclofenac sodium 1 % Gel Commonly known as:  VOLTAREN Apply 2 g topically 4 (four) times daily.   glipiZIDE 10 MG 24 hr tablet Commonly known as:  GLUCOTROL XL Take 10 mg  by mouth daily.   insulin detemir 100 UNIT/ML injection Commonly known as:  LEVEMIR Inject 0.1 mLs (10 Units total) into the skin at bedtime.   Lidocaine 4 % Ptch Place 1 patch onto the skin daily.   nitroGLYCERIN 0.4 MG/SPRAY spray Commonly known as:  NITROLINGUAL Place 1 spray under the tongue as directed.   Omega 3 1000 MG Caps Take 1 capsule by mouth daily.   pravastatin 20 MG tablet Commonly known as:  PRAVACHOL Take 20 mg by mouth every evening.   predniSONE 10 MG tablet Commonly known as:  DELTASONE Take 2 tablets (20 mg total) by mouth daily for 4 days.   silver sulfADIAZINE 1 % cream Commonly known as:  SILVADENE Apply 1 application topically 2 (two) times daily.   tamsulosin 0.4 MG Caps capsule Commonly known as:  FLOMAX Take 1 capsule (0.4 mg total) by mouth daily after breakfast. Start taking on:  July 13, 2018          Total Time in preparing paper work, data evaluation and todays exam - 42 minutes  Dustin Flock M.D on 07/12/2018 at [place]  [phone]

## 2018-07-12 NOTE — TOC Transition Note (Signed)
Transition of Care [facility]) - CM/SW Discharge Note   Patient Details  Name: Kyle Roberson MRN: [id number] Date of Birth: 10-15-1940  Transition of Care [facility] CM/SW Contact:  Katrina Stack, RN Phone Number: 07/12/2018, 11:28 AM   Clinical Narrative:   For discharge to [facility].  Confirmed with Tina at [facility] of Room number 714; and to transfer via ems after 1pm.  Updated primary nurse to call report, ems, and to contact family when ems arrives to transport.  Patient in agreement with transfer to [facility].   Final next level of care: Skilled Nursing Facility Barriers to Discharge: No Barriers Identified   Patient Goals and CMS Choice Patient states their goals for this hospitalization and ongoing recovery are:: Getting back home with [name] (girlfriend)   Choice offered to / list presented to : Patient(Patient and patients girlfriend pam)  Discharge Placement PASRR number recieved: 07/12/18            Patient chooses bed at: [facility] [place] Patient to be transferred to facility by: EMS Name of family member notified: Daughter Sharee Pimple [phone] Patient and family notified of of transfer: 07/12/18  Discharge Plan and Services In-house Referral: NA Discharge Planning Services: CM Consult Post Acute Care Choice: Home Health          DME Arranged: N/A DME Agency: NA HH Arranged: NA HH Agency: NA   Social Determinants of Health (Central) Interventions     Readmission Risk Interventions Readmission Risk Prevention Plan 07/09/2018  Transportation Screening Complete  PCP or Specialist Appt within 5-7 Days Complete  Home Care Screening Complete  Medication Review (RN CM) Complete

## 2018-07-12 NOTE — Progress Notes (Signed)
Report called to [facility]. Spoke with [place], (nurse receiving pt). VS WNL. IV removed. EMS called for transporttation

## 2018-07-14 DIAGNOSIS — E119 Type 2 diabetes mellitus without complications: Secondary | ICD-10-CM | POA: Diagnosis not present

## 2018-07-14 DIAGNOSIS — M791 Myalgia, unspecified site: Secondary | ICD-10-CM | POA: Diagnosis not present

## 2018-07-14 DIAGNOSIS — I10 Essential (primary) hypertension: Secondary | ICD-10-CM | POA: Diagnosis not present

## 2018-07-14 DIAGNOSIS — N189 Chronic kidney disease, unspecified: Secondary | ICD-10-CM | POA: Diagnosis not present

## 2018-07-14 DIAGNOSIS — E785 Hyperlipidemia, unspecified: Secondary | ICD-10-CM | POA: Diagnosis not present

## 2018-07-14 DIAGNOSIS — R001 Bradycardia, unspecified: Secondary | ICD-10-CM | POA: Diagnosis not present

## 2018-07-14 DIAGNOSIS — N390 Urinary tract infection, site not specified: Secondary | ICD-10-CM | POA: Diagnosis not present

## 2018-07-20 DIAGNOSIS — R609 Edema, unspecified: Secondary | ICD-10-CM | POA: Diagnosis not present

## 2018-07-20 DIAGNOSIS — R600 Localized edema: Secondary | ICD-10-CM | POA: Diagnosis not present

## 2018-07-20 DIAGNOSIS — I10 Essential (primary) hypertension: Secondary | ICD-10-CM | POA: Diagnosis not present

## 2018-07-20 DIAGNOSIS — N390 Urinary tract infection, site not specified: Secondary | ICD-10-CM | POA: Diagnosis not present

## 2018-07-20 DIAGNOSIS — M1049 Other secondary gout, multiple sites: Secondary | ICD-10-CM | POA: Diagnosis not present

## 2018-07-20 DIAGNOSIS — E7849 Other hyperlipidemia: Secondary | ICD-10-CM | POA: Diagnosis not present

## 2018-07-20 DIAGNOSIS — N189 Chronic kidney disease, unspecified: Secondary | ICD-10-CM | POA: Diagnosis not present

## 2018-07-20 DIAGNOSIS — Z299 Encounter for prophylactic measures, unspecified: Secondary | ICD-10-CM | POA: Diagnosis not present

## 2018-07-20 DIAGNOSIS — E119 Type 2 diabetes mellitus without complications: Secondary | ICD-10-CM | POA: Diagnosis not present

## 2018-07-20 DIAGNOSIS — K56600 Partial intestinal obstruction, unspecified as to cause: Secondary | ICD-10-CM | POA: Diagnosis not present

## 2018-07-20 DIAGNOSIS — N400 Enlarged prostate without lower urinary tract symptoms: Secondary | ICD-10-CM | POA: Diagnosis not present

## 2018-07-20 DIAGNOSIS — M6281 Muscle weakness (generalized): Secondary | ICD-10-CM | POA: Diagnosis not present

## 2018-07-20 DIAGNOSIS — M109 Gout, unspecified: Secondary | ICD-10-CM | POA: Diagnosis not present

## 2018-07-20 DIAGNOSIS — Z7189 Other specified counseling: Secondary | ICD-10-CM | POA: Diagnosis not present

## 2018-07-20 DIAGNOSIS — I4891 Unspecified atrial fibrillation: Secondary | ICD-10-CM | POA: Diagnosis not present

## 2018-07-20 DIAGNOSIS — R2689 Other abnormalities of gait and mobility: Secondary | ICD-10-CM | POA: Diagnosis not present

## 2018-07-20 DIAGNOSIS — R531 Weakness: Secondary | ICD-10-CM | POA: Diagnosis not present

## 2018-07-22 DIAGNOSIS — E7849 Other hyperlipidemia: Secondary | ICD-10-CM | POA: Diagnosis not present

## 2018-07-22 DIAGNOSIS — M1049 Other secondary gout, multiple sites: Secondary | ICD-10-CM | POA: Diagnosis not present

## 2018-07-22 DIAGNOSIS — E119 Type 2 diabetes mellitus without complications: Secondary | ICD-10-CM | POA: Diagnosis not present

## 2018-07-22 DIAGNOSIS — R531 Weakness: Secondary | ICD-10-CM | POA: Diagnosis not present

## 2018-07-22 DIAGNOSIS — I10 Essential (primary) hypertension: Secondary | ICD-10-CM | POA: Diagnosis not present

## 2018-07-22 LAB — LIPID PANEL
Cholesterol: 137 (ref 0–200)
HDL: 44 (ref 35–70)
LDL Cholesterol: 73
Triglycerides: 116 (ref 40–160)

## 2018-07-22 LAB — BASIC METABOLIC PANEL
BUN: 56 — AB (ref 4–21)
Creatinine: 1.8 — AB (ref 0.6–1.3)
Glucose: 139
Potassium: 4.6 (ref 3.4–5.3)
Sodium: 139 (ref 137–147)

## 2018-07-22 LAB — CBC AND DIFFERENTIAL
HCT: 31 — AB (ref 41–53)
Hemoglobin: 10.1 — AB (ref 13.5–17.5)
Platelets: 288 (ref 150–399)

## 2018-07-22 LAB — HEMOGLOBIN A1C: Hemoglobin A1C: 10

## 2018-07-25 DIAGNOSIS — E7849 Other hyperlipidemia: Secondary | ICD-10-CM | POA: Diagnosis not present

## 2018-07-25 DIAGNOSIS — N400 Enlarged prostate without lower urinary tract symptoms: Secondary | ICD-10-CM | POA: Diagnosis not present

## 2018-07-25 DIAGNOSIS — E119 Type 2 diabetes mellitus without complications: Secondary | ICD-10-CM | POA: Diagnosis not present

## 2018-07-25 DIAGNOSIS — R531 Weakness: Secondary | ICD-10-CM | POA: Diagnosis not present

## 2018-07-25 DIAGNOSIS — I10 Essential (primary) hypertension: Secondary | ICD-10-CM | POA: Diagnosis not present

## 2018-07-25 DIAGNOSIS — R600 Localized edema: Secondary | ICD-10-CM | POA: Diagnosis not present

## 2018-07-25 DIAGNOSIS — M1049 Other secondary gout, multiple sites: Secondary | ICD-10-CM | POA: Diagnosis not present

## 2018-07-26 DIAGNOSIS — R531 Weakness: Secondary | ICD-10-CM | POA: Diagnosis not present

## 2018-07-26 DIAGNOSIS — E119 Type 2 diabetes mellitus without complications: Secondary | ICD-10-CM | POA: Diagnosis not present

## 2018-07-26 DIAGNOSIS — R600 Localized edema: Secondary | ICD-10-CM | POA: Diagnosis not present

## 2018-07-26 DIAGNOSIS — I10 Essential (primary) hypertension: Secondary | ICD-10-CM | POA: Diagnosis not present

## 2018-07-26 DIAGNOSIS — N400 Enlarged prostate without lower urinary tract symptoms: Secondary | ICD-10-CM | POA: Diagnosis not present

## 2018-07-26 DIAGNOSIS — E7849 Other hyperlipidemia: Secondary | ICD-10-CM | POA: Diagnosis not present

## 2018-07-26 DIAGNOSIS — M1049 Other secondary gout, multiple sites: Secondary | ICD-10-CM | POA: Diagnosis not present

## 2018-07-26 DIAGNOSIS — Z7189 Other specified counseling: Secondary | ICD-10-CM | POA: Diagnosis not present

## 2018-08-01 ENCOUNTER — Other Ambulatory Visit: Payer: Self-pay

## 2018-08-01 DIAGNOSIS — M109 Gout, unspecified: Secondary | ICD-10-CM

## 2018-08-01 DIAGNOSIS — E291 Testicular hypofunction: Secondary | ICD-10-CM | POA: Insufficient documentation

## 2018-08-01 DIAGNOSIS — Z8719 Personal history of other diseases of the digestive system: Secondary | ICD-10-CM | POA: Insufficient documentation

## 2018-08-01 DIAGNOSIS — N529 Male erectile dysfunction, unspecified: Secondary | ICD-10-CM | POA: Insufficient documentation

## 2018-08-01 DIAGNOSIS — I4891 Unspecified atrial fibrillation: Secondary | ICD-10-CM

## 2018-08-01 DIAGNOSIS — M1189 Other specified crystal arthropathies, multiple sites: Secondary | ICD-10-CM | POA: Insufficient documentation

## 2018-08-01 DIAGNOSIS — I872 Venous insufficiency (chronic) (peripheral): Secondary | ICD-10-CM | POA: Insufficient documentation

## 2018-08-01 DIAGNOSIS — E669 Obesity, unspecified: Secondary | ICD-10-CM | POA: Insufficient documentation

## 2018-08-01 DIAGNOSIS — I4819 Other persistent atrial fibrillation: Secondary | ICD-10-CM | POA: Insufficient documentation

## 2018-08-02 ENCOUNTER — Other Ambulatory Visit: Payer: Self-pay

## 2018-08-02 ENCOUNTER — Ambulatory Visit
Admission: RE | Admit: 2018-08-02 | Discharge: 2018-08-02 | Disposition: A | Discharge status: Home / Self Care | Payer: Medicare HMO | Attending: Internal Medicine | Admitting: Internal Medicine

## 2018-08-02 ENCOUNTER — Ambulatory Visit
Admission: RE | Admit: 2018-08-02 | Discharge: 2018-08-02 | Disposition: A | Discharge status: Home / Self Care | Payer: Medicare HMO | Source: Ambulatory Visit | Attending: Internal Medicine | Admitting: Internal Medicine

## 2018-08-02 ENCOUNTER — Encounter: Payer: Self-pay | Admitting: Internal Medicine

## 2018-08-02 ENCOUNTER — Ambulatory Visit (INDEPENDENT_AMBULATORY_CARE_PROVIDER_SITE_OTHER): Payer: Medicare HMO | Admitting: Internal Medicine

## 2018-08-02 ENCOUNTER — Other Ambulatory Visit
Admission: RE | Admit: 2018-08-02 | Discharge: 2018-08-02 | Disposition: A | Discharge status: Home / Self Care | Payer: Medicare HMO | Source: Home / Self Care | Attending: Internal Medicine | Admitting: Internal Medicine

## 2018-08-02 VITALS — BP 134/82 | HR 53 | Ht 68.0 in | Wt 260.0 lb

## 2018-08-02 DIAGNOSIS — E119 Type 2 diabetes mellitus without complications: Secondary | ICD-10-CM | POA: Diagnosis not present

## 2018-08-02 DIAGNOSIS — I2510 Atherosclerotic heart disease of native coronary artery without angina pectoris: Secondary | ICD-10-CM | POA: Diagnosis not present

## 2018-08-02 DIAGNOSIS — I509 Heart failure, unspecified: Secondary | ICD-10-CM | POA: Insufficient documentation

## 2018-08-02 DIAGNOSIS — I10 Essential (primary) hypertension: Secondary | ICD-10-CM | POA: Diagnosis not present

## 2018-08-02 DIAGNOSIS — R918 Other nonspecific abnormal finding of lung field: Secondary | ICD-10-CM | POA: Diagnosis not present

## 2018-08-02 DIAGNOSIS — R0602 Shortness of breath: Secondary | ICD-10-CM | POA: Diagnosis not present

## 2018-08-02 DIAGNOSIS — K219 Gastro-esophageal reflux disease without esophagitis: Secondary | ICD-10-CM | POA: Diagnosis not present

## 2018-08-02 DIAGNOSIS — I499 Cardiac arrhythmia, unspecified: Secondary | ICD-10-CM | POA: Diagnosis not present

## 2018-08-02 DIAGNOSIS — R609 Edema, unspecified: Secondary | ICD-10-CM | POA: Diagnosis not present

## 2018-08-02 DIAGNOSIS — I110 Hypertensive heart disease with heart failure: Secondary | ICD-10-CM | POA: Diagnosis not present

## 2018-08-02 DIAGNOSIS — J90 Pleural effusion, not elsewhere classified: Secondary | ICD-10-CM | POA: Insufficient documentation

## 2018-08-02 DIAGNOSIS — E118 Type 2 diabetes mellitus with unspecified complications: Secondary | ICD-10-CM | POA: Diagnosis not present

## 2018-08-02 DIAGNOSIS — N183 Chronic kidney disease, stage 3 (moderate): Secondary | ICD-10-CM

## 2018-08-02 DIAGNOSIS — M1189 Other specified crystal arthropathies, multiple sites: Secondary | ICD-10-CM | POA: Diagnosis not present

## 2018-08-02 DIAGNOSIS — E1122 Type 2 diabetes mellitus with diabetic chronic kidney disease: Secondary | ICD-10-CM | POA: Diagnosis not present

## 2018-08-02 LAB — HEMOGLOBIN A1C
Hgb A1c MFr Bld: 9.6 % — ABNORMAL HIGH (ref 4.8–5.6)
Mean Plasma Glucose: 228.82 mg/dL

## 2018-08-02 LAB — COMPREHENSIVE METABOLIC PANEL
ALT: 39 U/L (ref 0–44)
AST: 26 U/L (ref 15–41)
Albumin: 3.1 g/dL — ABNORMAL LOW (ref 3.5–5.0)
Alkaline Phosphatase: 71 U/L (ref 38–126)
Anion gap: 8 (ref 5–15)
BUN: 29 mg/dL — ABNORMAL HIGH (ref 8–23)
CO2: 26 mmol/L (ref 22–32)
Calcium: 8.9 mg/dL (ref 8.9–10.3)
Chloride: 103 mmol/L (ref 98–111)
Creatinine, Ser: 1.57 mg/dL — ABNORMAL HIGH (ref 0.61–1.24)
GFR calc Af Amer: 48 mL/min — ABNORMAL LOW (ref >60)
GFR calc non Af Amer: 41 mL/min — ABNORMAL LOW (ref >60)
Glucose, Bld: 92 mg/dL (ref 70–99)
Potassium: 4.3 mmol/L (ref 3.5–5.1)
Sodium: 137 mmol/L (ref 135–145)
Total Bilirubin: 0.6 mg/dL (ref 0.3–1.2)
Total Protein: 6.9 g/dL (ref 6.5–8.1)

## 2018-08-02 LAB — CBC WITH DIFFERENTIAL/PLATELET
Abs Immature Granulocytes: 0.06 10*3/uL (ref 0.00–0.07)
Basophils Absolute: 0 10*3/uL (ref 0.0–0.1)
Basophils Relative: 0 %
Eosinophils Absolute: 0.3 10*3/uL (ref 0.0–0.5)
Eosinophils Relative: 3 %
HCT: 37.8 % — ABNORMAL LOW (ref 39.0–52.0)
Hemoglobin: 11.4 g/dL — ABNORMAL LOW (ref 13.0–17.0)
Immature Granulocytes: 1 %
Lymphocytes Relative: 21 %
Lymphs Abs: 1.8 10*3/uL (ref 0.7–4.0)
MCH: 27.3 pg (ref 26.0–34.0)
MCHC: 30.2 g/dL (ref 30.0–36.0)
MCV: 90.6 fL (ref 80.0–100.0)
Monocytes Absolute: 0.7 10*3/uL (ref 0.1–1.0)
Monocytes Relative: 8 %
Neutro Abs: 5.6 10*3/uL (ref 1.7–7.7)
Neutrophils Relative %: 67 %
Platelets: 274 10*3/uL (ref 150–400)
RBC: 4.17 MIL/uL — ABNORMAL LOW (ref 4.22–5.81)
RDW: 14.9 % (ref 11.5–15.5)
WBC: 8.4 10*3/uL (ref 4.0–10.5)
nRBC: 0 % (ref 0.0–0.2)

## 2018-08-02 LAB — TSH: TSH: 1.568 u[IU]/mL (ref 0.350–4.500)

## 2018-08-02 MED ORDER — FUROSEMIDE 20 MG PO TABS: 20.0000 mg | ORAL_TABLET | Freq: Two times a day (BID) | ORAL | 0 refills | Status: DC

## 2018-08-02 MED ORDER — POTASSIUM CHLORIDE ER 10 MEQ PO TBCR: 10.0000 meq | EXTENDED_RELEASE_TABLET | Freq: Two times a day (BID) | ORAL | 0 refills | Status: DC

## 2018-08-02 NOTE — Progress Notes (Signed)
Date:  08/02/2018   Name:  Kyle Roberson   DOB:  [birth year]   MRN:  [id number]  Pt is here today with his significant other Nancy Marus, POA. Chief Complaint: Establish Care; Edema (Not able to do much walking because of fluid and pain.); Sore (Sore in between butt cheeks); and Atrial Fibrillation (Wondering if still having afib. Concerned of CHF because of wheezing and afib. SOB when walking to bathroom.) Patient was admitted to [facility] on 07/06/18. Admission Diagnosis  Weakness [R53.1] AKI (acute kidney injury) ([place]) [N17.9]  Discharge Diagnosis   Principal Problem: Small bowel obstruction/partial small bowel obstruction Acute kidney injury on chronic kidney injury disease stage III UTI Gout flare Diabetes type 2 BPH Hypertension Atrial fibrillation with bradycardia Weakness  Hospital course: BenjaminBradsheris a77 y.o.malewho presents with chief complaint as above. Patient presents the ED with a complaint of weakness. He states that he was previously diagnosed with a UTI but did not receive full treatment.  Patient initially was admitted for UTI and started on antibiotics.  And then subsequently patient started having abdominal distention and was diagnosed with a small bowel obstruction/partial small bowel obstruction had NG tube placed and was seen by surgery.  Patient small bowel obstruction is now resolved.  He is having good bowel movements.  His kidney injury is also improved.  Patient also complains of pain in legs and was noted to have a gout flare and treated with prednisone.  Patient is very weak and deconditioned need of rehab. He was sent to [facility].  He had some PTx. Then he went to rehab in [facility] for a week.  There they wrapped his legs with coban.  Atrial Fibrillation  Presents for follow-up visit. Symptoms include hypertension, shortness of breath and weakness. Symptoms are negative for chest pain, dizziness and palpitations. Symptom  course: pt does not know if he is still in Afib.  [name] thinks it may have been transient during his septic episode.  He is not on anticoagulation. Past medical history includes atrial fibrillation and hyperlipidemia.  Hypertension  This is a chronic problem. The problem is controlled (often too low and other times too high). Associated symptoms include shortness of breath. Pertinent negatives include no chest pain, headaches or palpitations. Past treatments include beta blockers, calcium channel blockers, diuretics and angiotensin blockers. The current treatment provides moderate improvement. There are no compliance problems.  Hypertensive end-organ damage includes kidney disease and CAD/MI.  Diabetes  He presents for his follow-up diabetic visit. He has type 2 diabetes mellitus. His disease course has been stable. Pertinent negatives for hypoglycemia include no dizziness, headaches or tremors. Associated symptoms include fatigue and weakness. Pertinent negatives for diabetes include no chest pain. Current diabetic treatment includes oral agent (monotherapy) and insulin injections (novolin N at bedtime). He monitors blood glucose at home 1-2 x per day. His breakfast blood glucose is taken between 6-7 am. His breakfast blood glucose range is generally 110-130 mg/dl. An ACE inhibitor/angiotensin II receptor blocker is being taken.  Hyperlipidemia  This is a chronic problem. Associated symptoms include shortness of breath. Pertinent negatives include no chest pain. Current antihyperlipidemic treatment includes statins. The current treatment provides significant improvement of lipids.  Gastroesophageal Reflux  He complains of heartburn and wheezing. He reports no abdominal pain, no chest pain or no coughing. This is a recurrent problem. The problem occurs frequently. Associated symptoms include fatigue. He has tried a histamine-2 antagonist (previously on zantac) for the symptoms.  Edema -  he has been holding  fluid increasingly since he was hospitalized in April.   He was having his legs wrapped when in rehab but since being home for 5 days, he is only elevating.  He sleeps in a recliner and trys to elevate his legs above his heart but is limited by SOB. CAD - remote MI and stent about 20 years ago.  Followed by Dr. Paulita Fujita in [place] but not seen for over a year. No hx of CHF or Afib.  No recent imaging. Pseudogout - diagnosed by [facility] when evaluated for ongoing hand and wrist pain.  Treated with colchicine but that was stopped due to renal issues.  Then treated with prednisone taper which has been completed.  He also has similar sx in his left knee and his right foot. Pressure ulcer - he has a small ulcer just above the gluteal cleft.  His SO is cleaning and applying "butt cream".  He is sleeping in a recliner and has trouble shifting his weight.  Lab Results  Component Value Date   HGBA1C 10.0 07/22/2018   Lab Results  Component Value Date   CREATININE 1.8 (A) 07/22/2018   BUN 56 (A) 07/22/2018   NA 139 07/22/2018   K 4.6 07/22/2018   CL 106 07/11/2018   CO2 23 07/11/2018   Lab Results  Component Value Date   WBC 9.7 07/10/2018   HGB 10.1 (A) 07/22/2018   HCT 31 (A) 07/22/2018   MCV 87.8 07/10/2018   PLT 288 07/22/2018    Review of Systems  Constitutional: Positive for appetite change, fatigue and unexpected weight change (at least 20 lbs over the past month). Negative for diaphoresis and fever.  HENT: Negative for trouble swallowing.   Eyes: Negative for visual disturbance.  Respiratory: Positive for shortness of breath and wheezing. Negative for cough and chest tightness.   Cardiovascular: Positive for leg swelling. Negative for chest pain and palpitations.  Gastrointestinal: Positive for abdominal distention and heartburn. Negative for abdominal pain, constipation and diarrhea.       Heartburn  Genitourinary: Negative for difficulty urinating, scrotal swelling and  testicular pain.  Musculoskeletal: Positive for arthralgias, gait problem and joint swelling.  Skin: Positive for rash and wound.  Allergic/Immunologic: Negative for environmental allergies.  Neurological: Positive for weakness and light-headedness. Negative for dizziness, tremors and headaches.  Hematological: Negative for adenopathy.  Psychiatric/Behavioral: Positive for sleep disturbance. Negative for dysphoric mood and suicidal ideas.    Patient Active Problem List   Diagnosis Date Noted  . Erectile dysfunction 08/01/2018  . History of colonic diverticulitis 08/01/2018  . 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 Obesity (BMI 30-39.9) 08/01/2018  . Venous insufficiency 08/01/2018  . A-fib ([place]) 08/01/2018  . Gout 08/01/2018  . AKI (acute kidney injury) ([place]) 07/06/2018  . HTN (hypertension) 07/06/2018  . Hyperlipidemia associated with type 2 diabetes mellitus ([place]) 07/06/2018  . Type II diabetes mellitus with complication ([place]) 85/88/5027  . Coronary artery disease involving native coronary artery of native heart without angina pectoris 06/05/2018  . 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[place]) 01/30/2017  . Vitamin D deficiency 01/30/2017  . Benign non-nodular prostatic hyperplasia with lower urinary tract symptoms 09/29/2014  . CKD stage 3 due to type 2 diabetes mellitus ([place]) 06/27/2014  . History of MI (myocardial infarction) 06/27/2014  . Nephrolithiasis 01/04/2012    Allergies  Allergen Reactions  . Atorvastatin Other (See Comments)  . Rosuvastatin Other (See Comments)  . Simvastatin Other (See Comments)    Past Surgical History:  Procedure Laterality Date  . CYSTOSCOPY    . HERNIA REPAIR      Social History   Tobacco Use  . Smoking status: Former Research scientist (life sciences)  . Smokeless tobacco: Never Used  Substance Use Topics  . Alcohol use: Yes  . Drug use: Not Currently     Medication list has been reviewed and updated.  Current Meds  Medication Sig  . amLODipine (NORVASC) 10 MG  tablet Take 10 mg by mouth daily.  . Ascorbic Acid (VITAMIN C) 1000 MG tablet Take 1,000 mg by mouth daily.  Marland Kitchen aspirin EC 81 MG tablet Take 81 mg by mouth daily.  . cholecalciferol (VITAMIN D3) 25 MCG (1000 UT) tablet Take 1,000 Units by mouth daily.  . diclofenac sodium (VOLTAREN) 1 % GEL Apply 2 g topically 4 (four) times daily.  . Flaxseed, Linseed, (FLAX SEED OIL) 1000 MG CAPS Take by mouth.  Marland Kitchen glipiZIDE (GLUCOTROL XL) 10 MG 24 hr tablet Take 10 mg by mouth daily.  . insulin NPH Human (NOVOLIN N) 100 UNIT/ML injection Inject 15-30 Units into the skin at bedtime.  . Lidocaine 4 % PTCH Place 1 patch onto the skin daily.  Marland Kitchen losartan-hydrochlorothiazide (HYZAAR) 50-12.5 MG tablet Take 1 tablet by mouth daily.  . Multiple Vitamins-Minerals (MULTIVITAMIN WITH MINERALS) tablet Take 1 tablet by mouth daily.  . nitroGLYCERIN (NITROLINGUAL) 0.4 MG/SPRAY spray Place 1 spray under the tongue as directed.  . Omega 3 1000 MG CAPS Take 1 capsule by mouth daily.  . pravastatin (PRAVACHOL) 20 MG tablet Take 20 mg by mouth every evening.  . silver sulfADIAZINE (SILVADENE) 1 % cream Apply 1 application topically 2 (two) times daily.  . tamsulosin (FLOMAX) 0.4 MG CAPS capsule Take 1 capsule (0.4 mg total) by mouth daily after breakfast.  . [DISCONTINUED] nebivolol (BYSTOLIC) 5 MG tablet Take 5 mg by mouth daily.    PHQ 2/9 Scores 08/02/2018  PHQ - 2 Score 0  PHQ- 9 Score 4    BP Readings from Last 3 Encounters:  08/02/18 134/82  07/12/18 (!) 158/78    Physical Exam Constitutional:      General: He is not in acute distress.    Appearance: Normal appearance. He is morbidly obese.  HENT:     Head: Normocephalic.  Neck:     Musculoskeletal: Normal range of motion and neck supple.     Vascular: No carotid bruit.  Cardiovascular:     Rate and Rhythm: Regular rhythm. Bradycardia present.     Comments: Unable to palpate pulses in either LE  Edema extends to lower back Pulmonary:     Effort:  Pulmonary effort is normal.     Breath sounds: Decreased breath sounds present. No wheezing, rhonchi or rales.  Abdominal:     General: Abdomen is protuberant.     Palpations: Abdomen is soft.     Tenderness: There is no abdominal tenderness.     Comments: Pt unable to recline for exam  Musculoskeletal:     Right ankle: Achilles tendon exhibits no pain.     Right lower leg: 2+ Pitting Edema present.     Left lower leg: 2+ Pitting Edema present.     Comments: Edema from lower back to toes - 3+ pitting edema bilaterally  Significant pain in right ankle and foot with standing - able to stand and pivot only   Lymphadenopathy:     Cervical: No cervical adenopathy.  Skin:    General: Skin is warm and dry.  Coloration: Skin is pale.          Comments: Unable to exam buttock lesion  Neurological:     Mental Status: He is alert and oriented to person, place, and time.     Motor: Weakness present.  Psychiatric:        Attention and Perception: Attention normal.        Mood and Affect: Mood normal.        Speech: Speech normal.        Cognition and Memory: Cognition normal.     Wt Readings from Last 3 Encounters:  08/02/18 260 lb (117.9 kg)  07/06/18 250 lb (113.4 kg)    BP 134/82   Pulse (!) 53   Ht 5\' 8"  (1.727 m)   Wt 260 lb (117.9 kg)   SpO2 96%   BMI 39.53 kg/m   Assessment and Plan: 1. Cardiac arrhythmia, unspecified cardiac arrhythmia type May still be in Afib but rate is slow - EKG 12-Lead - unknown rhythm rate 51  2. Acute on chronic congestive heart failure, unspecified heart failure type ([place]) Suspect CHF by history and exam Family to monitor closely for decompensation and go to ED if needed Begin lasix diuresis Follow up one week - DG Chest 2 View; Future - TSH - furosemide (LASIX) 20 MG tablet; Take 1-2 tablets (20-40 mg total) by mouth 2 (two) times daily.  Dispense: 120 tablet; Refill: 0 - potassium chloride (K-DUR) 10 MEQ tablet; Take 1 tablet (10  mEq total) by mouth 2 (two) times daily.  Dispense: 60 tablet; Refill: 0  3. Coronary artery disease involving native coronary artery of native heart without angina pectoris Needs to follow up with Cardiology asap - [name] will call and let me know if referral is needed  4. Essential hypertension Stop Bystolic Monitor for now Should improve with diuresis - Comprehensive metabolic panel - CBC with Differential/Platelet  5. CKD stage 3 due to type 2 diabetes mellitus ([place]) Recheck labs Hold on Colchicine/allopurinol/nsaids for now  6. Type II diabetes mellitus with complication (HCC) Continue glipzide and Novolin N sliding scale daily - Hemoglobin A1c  7. Pseudogout involving multiple joints Tylenol only for pain Will reassess once cardiac status improved  8. Gastroesophageal reflux disease, esophagitis presence not specified Recommend Pepcid otc prn  I spent 65 minutes with patient on this encounter.  More than 50% of time was spent in face to face education, medication management, counseling and care coordination.  Partially dictated using Editor, commissioning. Any errors are unintentional.  Halina Maidens, MD [facility]  08/02/2018

## 2018-08-02 NOTE — Patient Instructions (Addendum)
Blood sugar      0-150           5 units insulin                          151-220       10 units insulin                           221-300       15 units insulin                           >300             20 units insulin  Elevate toes above the nose  Furosemide 20-40 mg twice a day  Call to see Dr. Ronnald Ramp as soon as possible  Weight every day and record  Stop Bystolic

## 2018-08-03 ENCOUNTER — Other Ambulatory Visit: Payer: Self-pay | Admitting: Internal Medicine

## 2018-08-03 DIAGNOSIS — J189 Pneumonia, unspecified organism: Secondary | ICD-10-CM

## 2018-08-03 MED ORDER — AMOXICILLIN-POT CLAVULANATE 875-125 MG PO TABS: 1.0000 | ORAL_TABLET | Freq: Two times a day (BID) | ORAL | 0 refills | Status: AC

## 2018-08-10 ENCOUNTER — Ambulatory Visit: Payer: Self-pay | Admitting: Internal Medicine

## 2018-08-10 ENCOUNTER — Ambulatory Visit: Payer: BC Managed Care – PPO | Admitting: Internal Medicine

## 2018-08-16 ENCOUNTER — Ambulatory Visit
Admission: RE | Admit: 2018-08-16 | Discharge: 2018-08-16 | Disposition: A | Discharge status: Home / Self Care | Payer: BC Managed Care – PPO | Attending: Internal Medicine | Admitting: Internal Medicine

## 2018-08-16 ENCOUNTER — Other Ambulatory Visit: Payer: Self-pay

## 2018-08-16 ENCOUNTER — Ambulatory Visit (INDEPENDENT_AMBULATORY_CARE_PROVIDER_SITE_OTHER): Payer: BC Managed Care – PPO | Admitting: Internal Medicine

## 2018-08-16 ENCOUNTER — Other Ambulatory Visit
Admission: RE | Admit: 2018-08-16 | Discharge: 2018-08-16 | Disposition: A | Discharge status: Home / Self Care | Payer: Medicare HMO | Source: Home / Self Care | Attending: Internal Medicine | Admitting: Internal Medicine

## 2018-08-16 ENCOUNTER — Ambulatory Visit
Admission: RE | Admit: 2018-08-16 | Discharge: 2018-08-16 | Disposition: A | Discharge status: Home / Self Care | Payer: Medicare HMO | Source: Ambulatory Visit | Attending: Internal Medicine | Admitting: Internal Medicine

## 2018-08-16 ENCOUNTER — Encounter: Payer: Self-pay | Admitting: Internal Medicine

## 2018-08-16 VITALS — BP 134/64 | HR 74 | Ht 68.0 in | Wt 241.0 lb

## 2018-08-16 DIAGNOSIS — E118 Type 2 diabetes mellitus with unspecified complications: Secondary | ICD-10-CM

## 2018-08-16 DIAGNOSIS — N183 Chronic kidney disease, stage 3 unspecified: Secondary | ICD-10-CM

## 2018-08-16 DIAGNOSIS — I4891 Unspecified atrial fibrillation: Secondary | ICD-10-CM | POA: Diagnosis not present

## 2018-08-16 DIAGNOSIS — E1122 Type 2 diabetes mellitus with diabetic chronic kidney disease: Secondary | ICD-10-CM | POA: Diagnosis not present

## 2018-08-16 DIAGNOSIS — M25571 Pain in right ankle and joints of right foot: Secondary | ICD-10-CM

## 2018-08-16 DIAGNOSIS — I509 Heart failure, unspecified: Secondary | ICD-10-CM | POA: Diagnosis not present

## 2018-08-16 DIAGNOSIS — N401 Enlarged prostate with lower urinary tract symptoms: Secondary | ICD-10-CM | POA: Diagnosis not present

## 2018-08-16 DIAGNOSIS — M7989 Other specified soft tissue disorders: Secondary | ICD-10-CM | POA: Diagnosis not present

## 2018-08-16 LAB — COMPREHENSIVE METABOLIC PANEL
ALT: 17 U/L (ref 0–44)
AST: 13 U/L — ABNORMAL LOW (ref 15–41)
Albumin: 3 g/dL — ABNORMAL LOW (ref 3.5–5.0)
Alkaline Phosphatase: 70 U/L (ref 38–126)
Anion gap: 10 (ref 5–15)
BUN: 26 mg/dL — ABNORMAL HIGH (ref 8–23)
CO2: 27 mmol/L (ref 22–32)
Calcium: 9.2 mg/dL (ref 8.9–10.3)
Chloride: 99 mmol/L (ref 98–111)
Creatinine, Ser: 1.63 mg/dL — ABNORMAL HIGH (ref 0.61–1.24)
GFR calc Af Amer: 46 mL/min — ABNORMAL LOW (ref >60)
GFR calc non Af Amer: 39 mL/min — ABNORMAL LOW (ref >60)
Glucose, Bld: 274 mg/dL — ABNORMAL HIGH (ref 70–99)
Potassium: 4.6 mmol/L (ref 3.5–5.1)
Sodium: 136 mmol/L (ref 135–145)
Total Bilirubin: 0.3 mg/dL (ref 0.3–1.2)
Total Protein: 6.7 g/dL (ref 6.5–8.1)

## 2018-08-16 MED ORDER — TAMSULOSIN HCL 0.4 MG PO CAPS: 0.4000 mg | ORAL_CAPSULE | Freq: Every day | ORAL | 3 refills | Status: DC

## 2018-08-16 NOTE — Progress Notes (Signed)
Date:  08/16/2018   Name:  Kyle Roberson   DOB:  1940-10-18   MRN:  [id number]   Chief Complaint: Congestive Heart Failure (Follow up on fluid and CHF) and Foot Pain [address] and hit his foot at end of April and never had XRAY. Hurts when walking and concerned of fracture. )  Congestive Heart Failure  Presents for follow-up visit. Associated symptoms include shortness of breath. Pertinent negatives include no abdominal pain, chest pain, fatigue, palpitations or unexpected weight change. The symptoms have been improving. Compliance with total regimen is 76-100%.  Foot Pain  This is a chronic problem. The current episode started more than 1 month ago. The problem occurs constantly. The problem has been unchanged (right ankle hurt after falling out of bed in rehab). Associated symptoms include arthralgias. Pertinent negatives include no abdominal pain, chest pain, coughing, fatigue, fever or headaches. He has tried nothing for the symptoms.  Diabetes  He presents for his follow-up diabetic visit. He has type 2 diabetes mellitus. Pertinent negatives for hypoglycemia include no dizziness, headaches or nervousness/anxiousness. Pertinent negatives for diabetes include no chest pain and no fatigue. Current diabetic treatment includes oral agent (monotherapy) (glipizide twice a day; holding insulin). He monitors blood glucose at home 1-2 x per day. His breakfast blood glucose is taken between 7-8 am. His breakfast blood glucose range is generally 110-130 mg/dl.  Abnormal CXR - possible pneumonia.  Treated with Augmentin and will need repeat films in several weeks. CXR: Small bilateral effusions.  Patchy nodular airspace disease in the right upper lobe appears new and is concerning for pneumonia. Followup PA and lateral chest X-ray is recommended in 3-4 weeks following trial of antibiotic therapy to ensure resolution and exclude underlying malignancy.  Lab Results  Component Value Date   CREATININE 1.63 (H) 08/16/2018   BUN 26 (H) 08/16/2018   NA 136 08/16/2018   K 4.6 08/16/2018   CL 99 08/16/2018   CO2 27 08/16/2018    Lab Results  Component Value Date   HGBA1C 9.6 (H) 08/02/2018   Lab Results  Component Value Date   WBC 8.4 08/02/2018   HGB 11.4 (L) 08/02/2018   HCT 37.8 (L) 08/02/2018   MCV 90.6 08/02/2018   PLT 274 08/02/2018    Review of Systems  Constitutional: Negative for appetite change, fatigue, fever and unexpected weight change.  HENT: Negative for trouble swallowing.   Respiratory: Positive for shortness of breath. Negative for cough, chest tightness and wheezing.   Cardiovascular: Positive for leg swelling. Negative for chest pain and palpitations.  Gastrointestinal: Negative for abdominal pain, blood in stool and constipation.  Musculoskeletal: Positive for arthralgias and gait problem.  Neurological: Negative for dizziness and headaches.  Psychiatric/Behavioral: Negative for sleep disturbance. The patient is not nervous/anxious.     Patient Active Problem List   Diagnosis Date Noted  . GERD (gastroesophageal reflux disease) 08/02/2018  . Erectile dysfunction 08/01/2018  . History of colonic diverticulitis 08/01/2018  . 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 Obesity (BMI 30-39.9) 08/01/2018  . Venous insufficiency 08/01/2018  . A-fib ([place]) 08/01/2018  . 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 HTN (hypertension) 07/06/2018  . Hyperlipidemia associated with type 2 diabetes mellitus ([place]) 07/06/2018  . Type II diabetes mellitus with complication ([place]) 70/03/7492  . Coronary artery disease involving native coronary artery of native heart without angina pectoris 06/05/2018  . 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[place]) 01/30/2017  . Vitamin D deficiency 01/30/2017  . Benign non-nodular prostatic hyperplasia with lower urinary  tract symptoms 09/29/2014  . CKD stage 3 due to type 2 diabetes mellitus ([place]) 06/27/2014  . History of MI (myocardial  infarction) 06/27/2014  . Nephrolithiasis 01/04/2012    Allergies  Allergen Reactions  . Atorvastatin Other (See Comments)  . Rosuvastatin Other (See Comments)  . Simvastatin Other (See Comments)    Past Surgical History:  Procedure Laterality Date  . CYSTOSCOPY    . HERNIA REPAIR      Social History   Tobacco Use  . Smoking status: Former Research scientist (life sciences)  . Smokeless tobacco: Never Used  Substance Use Topics  . Alcohol use: Yes  . Drug use: Not Currently     Medication list has been reviewed and updated.  Current Meds  Medication Sig  . amLODipine (NORVASC) 10 MG tablet Take 10 mg by mouth daily.  . Ascorbic Acid (VITAMIN C) 1000 MG tablet Take 1,000 mg by mouth daily.  Marland Kitchen aspirin EC 81 MG tablet Take 81 mg by mouth daily.  . cholecalciferol (VITAMIN D3) 25 MCG (1000 UT) tablet Take 1,000 Units by mouth daily.  . diclofenac sodium (VOLTAREN) 1 % GEL Apply 2 g topically 4 (four) times daily.  . Flaxseed, Linseed, (FLAX SEED OIL) 1000 MG CAPS Take by mouth.  . furosemide (LASIX) 20 MG tablet Take 1-2 tablets (20-40 mg total) by mouth 2 (two) times daily.  Marland Kitchen glipiZIDE (GLUCOTROL XL) 10 MG 24 hr tablet Take 10 mg by mouth daily.  . insulin NPH Human (NOVOLIN N) 100 UNIT/ML injection Inject 15-30 Units into the skin at bedtime.  . Lidocaine 4 % PTCH Place 1 patch onto the skin daily.  Marland Kitchen losartan-hydrochlorothiazide (HYZAAR) 50-12.5 MG tablet Take 1 tablet by mouth daily.  . Multiple Vitamins-Minerals (MULTIVITAMIN WITH MINERALS) tablet Take 1 tablet by mouth daily.  . nitroGLYCERIN (NITROLINGUAL) 0.4 MG/SPRAY spray Place 1 spray under the tongue as directed.  . Omega 3 1000 MG CAPS Take 1 capsule by mouth daily.  . potassium chloride (K-DUR) 10 MEQ tablet Take 1 tablet (10 mEq total) by mouth 2 (two) times daily.  . pravastatin (PRAVACHOL) 20 MG tablet Take 20 mg by mouth every evening.  . silver sulfADIAZINE (SILVADENE) 1 % cream Apply 1 application topically 2 (two) times daily.   . tamsulosin (FLOMAX) 0.4 MG CAPS capsule Take 1 capsule (0.4 mg total) by mouth daily after breakfast.  . [DISCONTINUED] tamsulosin (FLOMAX) 0.4 MG CAPS capsule Take 1 capsule (0.4 mg total) by mouth daily after breakfast.    PHQ 2/9 Scores 08/02/2018  PHQ - 2 Score 0  PHQ- 9 Score 4    BP Readings from Last 3 Encounters:  08/16/18 134/64  08/02/18 134/82  07/12/18 (!) 158/78    Physical Exam Vitals signs and nursing note reviewed.  Constitutional:      General: He is not in acute distress.    Appearance: He is well-developed.  HENT:     Head: Normocephalic and atraumatic.     Nose: Nose normal.  Neck:     Musculoskeletal: Normal range of motion.  Cardiovascular:     Rate and Rhythm: Normal rate. Rhythm irregular.  Pulmonary:     Effort: Pulmonary effort is normal. No respiratory distress.     Breath sounds: No wheezing or rhonchi.  Abdominal:     General: Abdomen is protuberant. Bowel sounds are normal.     Palpations: Abdomen is soft.     Tenderness: There is no abdominal tenderness.  Musculoskeletal: Normal range of motion.  General: Swelling and tenderness (right ankle) present.     Right lower leg: Edema present.     Left lower leg: Edema present.  Lymphadenopathy:     Cervical: No cervical adenopathy.  Skin:    General: Skin is warm and dry.     Findings: No rash.  Neurological:     Mental Status: He is alert and oriented to person, place, and time.  Psychiatric:        Behavior: Behavior normal.        Thought Content: Thought content normal.     Wt Readings from Last 3 Encounters:  08/16/18 241 lb (109.3 kg)  08/02/18 260 lb (117.9 kg)  07/06/18 250 lb (113.4 kg)    BP 134/64   Pulse 74   Ht 5\' 8"  (1.727 m)   Wt 241 lb (109.3 kg)   SpO2 94%   BMI 36.64 kg/m   Assessment and Plan: 1. Acute on chronic congestive heart failure, unspecified heart failure type ([place]) Improved symptomatically Continue lasix daily if there is a weight  increase Follow up with Cardiology - Comprehensive metabolic panel  2. Atrial fibrillation, unspecified type (HCC) Rate improved Continue ASA 81 mg daily Follow up with cardiology  3. Right ankle pain, unspecified chronicity Rule out fracture - DG Ankle Complete Right; Future  4. Type II diabetes mellitus with complication (HCC) Continue oral agents  5. CKD stage 3 due to type 2 diabetes mellitus (HCC) stable  6. Benign non-nodular prostatic hyperplasia with lower urinary tract symptoms - tamsulosin (FLOMAX) 0.4 MG CAPS capsule; Take 1 capsule (0.4 mg total) by mouth daily after breakfast.  Dispense: 90 capsule; Refill: 3   Partially dictated using Editor, commissioning. Any errors are unintentional.  Halina Maidens, MD [facility]  08/16/2018

## 2018-09-14 ENCOUNTER — Ambulatory Visit: Payer: BC Managed Care – PPO | Admitting: Internal Medicine

## 2018-09-14 ENCOUNTER — Other Ambulatory Visit: Payer: Self-pay

## 2018-09-14 ENCOUNTER — Ambulatory Visit: Payer: BC Managed Care – PPO

## 2018-09-14 DIAGNOSIS — J189 Pneumonia, unspecified organism: Secondary | ICD-10-CM

## 2018-09-14 NOTE — Progress Notes (Deleted)
    Date:  09/14/2018   Name:  Kyle Roberson   DOB:  1940/11/30   MRN:  [id number]   Chief Complaint: No chief complaint on file.  Diabetes He presents for his follow-up diabetic visit. He has type 2 diabetes mellitus. His disease course has been improving. Current diabetic treatment includes insulin injections and oral agent (dual therapy). He is compliant with treatment most of the time.  Pneumonia He complains of chest tightness, cough and difficulty breathing. The current episode started more than 1 month ago. The problem has been gradually improving. The cough is non-productive. Relieved by: completed a course of augmentin - due for repeat CXR.   Lab Results  Component Value Date   HGBA1C 9.6 (H) 08/02/2018   Lab Results  Component Value Date   CREATININE 1.63 (H) 08/16/2018   BUN 26 (H) 08/16/2018   NA 136 08/16/2018   K 4.6 08/16/2018   CL 99 08/16/2018   CO2 27 08/16/2018     Review of Systems  Respiratory: Positive for cough.     Patient Active Problem List   Diagnosis Date Noted  . GERD (gastroesophageal reflux disease) 08/02/2018  . Erectile dysfunction 08/01/2018  . History of colonic diverticulitis 08/01/2018  . 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 Obesity (BMI 30-39.9) 08/01/2018  . Venous insufficiency 08/01/2018  . A-fib ([place]) 08/01/2018  . 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 HTN (hypertension) 07/06/2018  . Hyperlipidemia associated with type 2 diabetes mellitus ([place]) 07/06/2018  . Type II diabetes mellitus with complication ([place]) 89/21/1941  . Coronary artery disease involving native coronary artery of native heart without angina pectoris 06/05/2018  . 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 Vitamin D deficiency 01/30/2017  . Benign non-nodular prostatic hyperplasia with lower urinary tract symptoms 09/29/2014  . CKD stage 3 due to type 2 diabetes mellitus ([place]) 06/27/2014  . History of MI (myocardial infarction) 06/27/2014   . Nephrolithiasis 01/04/2012    Allergies  Allergen Reactions  . Atorvastatin Other (See Comments)  . Rosuvastatin Other (See Comments)  . Simvastatin Other (See Comments)    Past Surgical History:  Procedure Laterality Date  . CYSTOSCOPY    . HERNIA REPAIR      Social History   Tobacco Use  . Smoking status: Former Research scientist (life sciences)  . Smokeless tobacco: Never Used  Substance Use Topics  . Alcohol use: Yes  . Drug use: Not Currently     Medication list has been reviewed and updated.  No outpatient medications have been marked as taking for the 09/14/18 encounter (Appointment) with Glean Hess, MD.    [facility] 2/9 Scores 08/02/2018  PHQ - 2 Score 0  PHQ- 9 Score 4    BP Readings from Last 3 Encounters:  08/16/18 134/64  08/02/18 134/82  07/12/18 (!) 158/78    Physical Exam  Wt Readings from Last 3 Encounters:  08/16/18 241 lb (109.3 kg)  08/02/18 260 lb (117.9 kg)  07/06/18 250 lb (113.4 kg)    There were no vitals taken for this visit.  Assessment and Plan:

## 2018-09-15 ENCOUNTER — Ambulatory Visit: Payer: BC Managed Care – PPO

## 2018-11-08 ENCOUNTER — Encounter: Payer: Self-pay | Admitting: Internal Medicine

## 2018-11-08 ENCOUNTER — Other Ambulatory Visit
Admission: RE | Admit: 2018-11-08 | Discharge: 2018-11-08 | Disposition: A | Discharge status: Home / Self Care | Payer: Medicare HMO | Attending: Internal Medicine | Admitting: Internal Medicine

## 2018-11-08 ENCOUNTER — Ambulatory Visit (INDEPENDENT_AMBULATORY_CARE_PROVIDER_SITE_OTHER): Payer: Medicare HMO | Admitting: Internal Medicine

## 2018-11-08 ENCOUNTER — Other Ambulatory Visit: Payer: Self-pay

## 2018-11-08 VITALS — BP 122/78 | HR 78 | Ht 68.0 in | Wt 242.0 lb

## 2018-11-08 DIAGNOSIS — I10 Essential (primary) hypertension: Secondary | ICD-10-CM

## 2018-11-08 DIAGNOSIS — I480 Paroxysmal atrial fibrillation: Secondary | ICD-10-CM | POA: Diagnosis not present

## 2018-11-08 DIAGNOSIS — M19041 Primary osteoarthritis, right hand: Secondary | ICD-10-CM | POA: Diagnosis not present

## 2018-11-08 DIAGNOSIS — N183 Chronic kidney disease, stage 3 unspecified: Secondary | ICD-10-CM

## 2018-11-08 DIAGNOSIS — E118 Type 2 diabetes mellitus with unspecified complications: Secondary | ICD-10-CM

## 2018-11-08 DIAGNOSIS — E1122 Type 2 diabetes mellitus with diabetic chronic kidney disease: Secondary | ICD-10-CM | POA: Diagnosis not present

## 2018-11-08 DIAGNOSIS — N401 Enlarged prostate with lower urinary tract symptoms: Secondary | ICD-10-CM

## 2018-11-08 DIAGNOSIS — I2510 Atherosclerotic heart disease of native coronary artery without angina pectoris: Secondary | ICD-10-CM | POA: Diagnosis not present

## 2018-11-08 DIAGNOSIS — I872 Venous insufficiency (chronic) (peripheral): Secondary | ICD-10-CM

## 2018-11-08 LAB — COMPREHENSIVE METABOLIC PANEL
ALT: 12 U/L (ref 0–44)
AST: 14 U/L — ABNORMAL LOW (ref 15–41)
Albumin: 3.4 g/dL — ABNORMAL LOW (ref 3.5–5.0)
Alkaline Phosphatase: 74 U/L (ref 38–126)
Anion gap: 10 (ref 5–15)
BUN: 31 mg/dL — ABNORMAL HIGH (ref 8–23)
CO2: 24 mmol/L (ref 22–32)
Calcium: 9.1 mg/dL (ref 8.9–10.3)
Chloride: 99 mmol/L (ref 98–111)
Creatinine, Ser: 1.8 mg/dL — ABNORMAL HIGH (ref 0.61–1.24)
GFR calc Af Amer: 41 mL/min — ABNORMAL LOW (ref >60)
GFR calc non Af Amer: 35 mL/min — ABNORMAL LOW (ref >60)
Glucose, Bld: 425 mg/dL — ABNORMAL HIGH (ref 70–99)
Potassium: 3.8 mmol/L (ref 3.5–5.1)
Sodium: 133 mmol/L — ABNORMAL LOW (ref 135–145)
Total Bilirubin: 0.6 mg/dL (ref 0.3–1.2)
Total Protein: 7.3 g/dL (ref 6.5–8.1)

## 2018-11-08 LAB — HEMOGLOBIN A1C
Hgb A1c MFr Bld: 10.1 % — ABNORMAL HIGH (ref 4.8–5.6)
Mean Plasma Glucose: 243.17 mg/dL

## 2018-11-08 LAB — PSA: Prostatic Specific Antigen: 1.27 ng/mL (ref 0.00–4.00)

## 2018-11-08 MED ORDER — FUROSEMIDE 20 MG PO TABS: 20.0000 mg | ORAL_TABLET | ORAL | 0 refills | Status: DC

## 2018-11-08 NOTE — Progress Notes (Signed)
Date:  11/08/2018   Name:  Kyle Roberson   DOB:  August 22, 1940   MRN:  [id number]   Chief Complaint: Diabetes (Follow up. Said legs swollen but can do foot exam if we can get socks on and off. ), Urinary Urgency (Using the bathroom often. Every hour. Significant Other [name] wants to know if he can have his PSA checked. ), and Edema (Rt ankle worse than the left. Constant.) Pt being seen for his second visit since establishing care.  He reports doing fairly well.  He has edema in both legs which goes down some overnight with elevation in his recliner. He has hand stiffness on the right and is unable to make a fist. He is vague about past trauma.  He does not want to go see Dr. Ronnald Ramp again at [facility] - he was told that things there had deteriorated.  Diabetes He presents for his follow-up diabetic visit. He has type 2 diabetes mellitus. His disease course has been stable. Pertinent negatives for hypoglycemia include no dizziness, headaches or nervousness/anxiousness. Pertinent negatives for diabetes include no chest pain and no fatigue. Current diabetic treatment includes oral agent (monotherapy) (supposed to be taking insulin but does not ). He is compliant with treatment all of the time. He monitors blood glucose at home 1-2 x per week. Home blood sugar record trend: he thinks it has been good but his wife is the one who checkes.  He has not been taking insulin but his wife can administer it if needed. An ACE inhibitor/angiotensin II receptor blocker is being taken.  Hypertension This is a chronic problem. The problem is controlled. Pertinent negatives include no chest pain, headaches, palpitations, peripheral edema or shortness of breath. Past treatments include angiotensin blockers, calcium channel blockers and diuretics. The current treatment provides significant improvement. Hypertensive end-organ damage includes kidney disease, CAD/MI and heart failure.  Hyperlipidemia This is a chronic problem.  Pertinent negatives include no chest pain or shortness of breath. Current antihyperlipidemic treatment includes statins. The current treatment provides significant improvement of lipids.  Hand Pain  There was no injury mechanism. The pain is present in the right fingers. Quality: stiffness. The pain is mild. The pain has been constant since the incident. Pertinent negatives include no chest pain.  CAD/CHF/Edema - has swelling in both feet, improves with elevation.  No skin breakdown, unable to wear compression stockings because he can not get them on. CXR last visit did not show fluid overload.  He was treated for CAP. Resuming his ACEI/hct improved his BP and he was able to lose 20 lbs of fluid weight.  He was advised to return to Dr. Ronnald Ramp at [facility] but he declines.  Lab Results  Component Value Date   HGBA1C 9.6 (H) 08/02/2018   Lab Results  Component Value Date   CREATININE 1.63 (H) 08/16/2018   BUN 26 (H) 08/16/2018   NA 136 08/16/2018   K 4.6 08/16/2018   CL 99 08/16/2018   CO2 27 08/16/2018   Lab Results  Component Value Date   TSH 1.568 08/02/2018   Lab Results  Component Value Date   CHOL 137 07/22/2018   HDL 44 07/22/2018   LDLCALC 73 07/22/2018   TRIG 116 07/22/2018     Review of Systems  Constitutional: Negative for chills, fatigue, fever and unexpected weight change.  HENT: Negative for trouble swallowing.   Respiratory: Negative for chest tightness, shortness of breath and wheezing.   Cardiovascular: Positive for leg swelling. Negative  for chest pain and palpitations.  Gastrointestinal: Negative for abdominal pain, constipation and diarrhea.  Genitourinary: Positive for frequency. Negative for difficulty urinating (but goes frequently ), dysuria and urgency.  Musculoskeletal: Positive for arthralgias (right hand) and joint swelling (fingers of right hand).  Skin: Negative for color change and rash.  Neurological: Negative for dizziness and headaches.   Psychiatric/Behavioral: Negative for dysphoric mood and sleep disturbance. The patient is not nervous/anxious.     Patient Active Problem List   Diagnosis Date Noted  . GERD (gastroesophageal reflux disease) 08/02/2018  . Erectile dysfunction 08/01/2018  . History of colonic diverticulitis 08/01/2018  . 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 Obesity (BMI 30-39.9) 08/01/2018  . Venous insufficiency 08/01/2018  . A-fib ([place]) 08/01/2018  . 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 HTN (hypertension) 07/06/2018  . Hyperlipidemia associated with type 2 diabetes mellitus ([place]) 07/06/2018  . Type II diabetes mellitus with complication ([place]) [id number]  . Coronary artery disease involving native coronary artery of native heart without angina pectoris 06/05/2018  . 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[place]) 01/30/2017  . Vitamin D deficiency 01/30/2017  . Benign non-nodular prostatic hyperplasia with lower urinary tract symptoms 09/29/2014  . CKD stage 3 due to type 2 diabetes mellitus ([place]) 06/27/2014  . History of MI (myocardial infarction) 06/27/2014  . Nephrolithiasis 01/04/2012    Allergies  Allergen Reactions  . Atorvastatin Other (See Comments)  . Rosuvastatin Other (See Comments)  . Simvastatin Other (See Comments)    Past Surgical History:  Procedure Laterality Date  . CYSTOSCOPY    . HERNIA REPAIR      Social History   Tobacco Use  . Smoking status: Former Research scientist (life sciences)  . Smokeless tobacco: Never Used  Substance Use Topics  . Alcohol use: Yes  . Drug use: Not Currently     Medication list has been reviewed and updated.  Current Meds  Medication Sig  . amLODipine (NORVASC) 10 MG tablet Take 10 mg by mouth daily.  . Ascorbic Acid (VITAMIN C) 1000 MG tablet Take 1,000 mg by mouth daily.  Marland Kitchen aspirin EC 81 MG tablet Take 81 mg by mouth daily.  . cholecalciferol (VITAMIN D3) 25 MCG (1000 UT) tablet Take 1,000 Units by mouth daily.  . Flaxseed, Linseed, (FLAX SEED OIL) 1000 MG  CAPS Take by mouth.  Marland Kitchen glipiZIDE (GLUCOTROL XL) 10 MG 24 hr tablet Take 10 mg by mouth daily.  . insulin NPH Human (NOVOLIN N) 100 UNIT/ML injection Inject 15-30 Units into the skin at bedtime.  . Lidocaine 4 % PTCH Place 1 patch onto the skin daily.  Marland Kitchen losartan-hydrochlorothiazide (HYZAAR) 50-12.5 MG tablet Take 1 tablet by mouth daily.  . Multiple Vitamins-Minerals (MULTIVITAMIN WITH MINERALS) tablet Take 1 tablet by mouth daily.  . nitroGLYCERIN (NITROLINGUAL) 0.4 MG/SPRAY spray Place 1 spray under the tongue as directed.  . Omega 3 1000 MG CAPS Take 1 capsule by mouth daily.  . potassium chloride (K-DUR) 10 MEQ tablet Take 1 tablet (10 mEq total) by mouth 2 (two) times daily.  . pravastatin (PRAVACHOL) 20 MG tablet Take 20 mg by mouth every evening.  . silver sulfADIAZINE (SILVADENE) 1 % cream Apply 1 application topically 2 (two) times daily.  . tamsulosin (FLOMAX) 0.4 MG CAPS capsule Take 1 capsule (0.4 mg total) by mouth daily after breakfast.    PHQ 2/9 Scores 11/08/2018 08/02/2018  PHQ - 2 Score 0 0  PHQ- 9 Score - 4    BP Readings from Last 3 Encounters:  11/08/18 122/78  08/16/18 134/64  08/02/18 134/82    Physical Exam Vitals signs and nursing note reviewed.  Constitutional:      General: He is not in acute distress.    Appearance: He is well-developed. He is obese.  HENT:     Head: Normocephalic and atraumatic.  Neck:     Musculoskeletal: Normal range of motion.  Cardiovascular:     Rate and Rhythm: Normal rate and regular rhythm. Occasional extrasystoles are present.    Pulses: Normal pulses.     Heart sounds: No murmur. No gallop.      Comments: Unable to palpate LE pulses Pulmonary:     Effort: Pulmonary effort is normal. No respiratory distress.     Breath sounds: No wheezing or rhonchi.  Musculoskeletal: Normal range of motion.        General: No tenderness or deformity.     Right lower leg: Edema present.     Left lower leg: Edema present.     Comments:  OA changes of DIP and PIP joints of fingers on right hand Pt unable to make a fist Pinch is weak  Lymphadenopathy:     Cervical: No cervical adenopathy.  Skin:    General: Skin is warm and dry.     Capillary Refill: Capillary refill takes less than 2 seconds.     Findings: No rash.  Neurological:     General: No focal deficit present.     Mental Status: He is alert and oriented to person, place, and time.  Psychiatric:        Attention and Perception: Attention normal.        Mood and Affect: Mood normal.        Behavior: Behavior normal.        Thought Content: Thought content normal.     Wt Readings from Last 3 Encounters:  11/08/18 242 lb (109.8 kg)  08/16/18 241 lb (109.3 kg)  08/02/18 260 lb (117.9 kg)    BP 122/78   Pulse 78   Ht 5\' 8"  (1.727 m)   Wt 242 lb (109.8 kg)   SpO2 97%   BMI 36.80 kg/m   Assessment and Plan: 1. Type II diabetes mellitus with complication (HCC) Uncontrolled per last labs - on glipizide and previously on insulin but apparently he is not taking that often Pt does not check BS regularly  May need to resume insulin once labs return Foot exam normal except for edema - Comprehensive metabolic panel  2. CKD stage 3 due to type 2 diabetes mellitus (HCC) Last CR elevated - unclear what his baseline is Will continue to monitor labs; recommend avoiding nsiads Pt denies SOB, PND - Hemoglobin A1c - Comprehensive metabolic panel  3. Essential hypertension Clinically stable but with hx of CAD; pt is sedentary but has no chest pain, SOB Continue Lisinopril/hct and amlodipine  4. Venous insufficiency With edema - pt unable to use TED hose Continue elevation; reduce sodium in diet Lasix qod as needed - furosemide (LASIX) 20 MG tablet; Take 1 tablet (20 mg total) by mouth every other day.  Dispense: 30 tablet; Refill: 0  5. Benign non-nodular prostatic hyperplasia with lower urinary tract symptoms Pt complains of frequent urination but no  sensation of incomplete emptying or dribbling He is on tamsulosin daily but concerned about possible prostate cancer - PSA  6. Coronary artery disease involving native coronary artery of native heart without angina pectoris Clinically stable without CHF s/s other than edema Will add low dose  lasix Pt declines follow up with previous cardiologist -will refer to Dr. Saunders Revel with [facility] Continue aspirin and statin therapy Recent lipids near goal - Ambulatory referral to Cardiology  7. Paroxysmal atrial fibrillation (HCC) In SR today Continue aspirin - Ambulatory referral to Cardiology   Partially dictated using [place]. Any errors are unintentional.  Halina Maidens, MD [facility]  11/08/2018

## 2018-11-10 ENCOUNTER — Encounter: Payer: Self-pay | Admitting: Internal Medicine

## 2018-11-10 NOTE — Progress Notes (Signed)
Patient has Novolin N at home. [name] wants [place] to send him her a my chart message with the instructions on how to use his insulin. She does want me to call her back about her question of a UTI. She said since his glucose is so high, can this indicate he has another UTI? She is now concerned about this and wants to know should they come back in to recheck for urine?   Please advise and I can call her and just send her instructions for his insulin through my chart.

## 2018-12-14 ENCOUNTER — Ambulatory Visit: Payer: Medicare HMO | Admitting: Internal Medicine

## 2019-01-02 ENCOUNTER — Other Ambulatory Visit: Payer: Self-pay

## 2019-01-02 ENCOUNTER — Emergency Department: Payer: Medicare HMO

## 2019-01-02 ENCOUNTER — Emergency Department
Admission: EM | Admit: 2019-01-02 | Discharge: 2019-01-03 | Disposition: A | Discharge status: Home / Self Care | Payer: Medicare HMO | Attending: Emergency Medicine | Admitting: Emergency Medicine

## 2019-01-02 DIAGNOSIS — E1122 Type 2 diabetes mellitus with diabetic chronic kidney disease: Secondary | ICD-10-CM | POA: Insufficient documentation

## 2019-01-02 DIAGNOSIS — R319 Hematuria, unspecified: Secondary | ICD-10-CM | POA: Diagnosis not present

## 2019-01-02 DIAGNOSIS — Z7982 Long term (current) use of aspirin: Secondary | ICD-10-CM | POA: Insufficient documentation

## 2019-01-02 DIAGNOSIS — I129 Hypertensive chronic kidney disease with stage 1 through stage 4 chronic kidney disease, or unspecified chronic kidney disease: Secondary | ICD-10-CM | POA: Diagnosis not present

## 2019-01-02 DIAGNOSIS — Z79899 Other long term (current) drug therapy: Secondary | ICD-10-CM | POA: Diagnosis not present

## 2019-01-02 DIAGNOSIS — N183 Chronic kidney disease, stage 3 unspecified: Secondary | ICD-10-CM | POA: Diagnosis not present

## 2019-01-02 DIAGNOSIS — Z7984 Long term (current) use of oral hypoglycemic drugs: Secondary | ICD-10-CM | POA: Insufficient documentation

## 2019-01-02 DIAGNOSIS — K5730 Diverticulosis of large intestine without perforation or abscess without bleeding: Secondary | ICD-10-CM | POA: Diagnosis not present

## 2019-01-02 DIAGNOSIS — Z87891 Personal history of nicotine dependence: Secondary | ICD-10-CM | POA: Diagnosis not present

## 2019-01-02 LAB — CBC WITH DIFFERENTIAL/PLATELET
Abs Immature Granulocytes: 0.03 10*3/uL (ref 0.00–0.07)
Basophils Absolute: 0 10*3/uL (ref 0.0–0.1)
Basophils Relative: 0 %
Eosinophils Absolute: 0.4 10*3/uL (ref 0.0–0.5)
Eosinophils Relative: 4 %
HCT: 39.1 % (ref 39.0–52.0)
Hemoglobin: 12.6 g/dL — ABNORMAL LOW (ref 13.0–17.0)
Immature Granulocytes: 0 %
Lymphocytes Relative: 22 %
Lymphs Abs: 2 10*3/uL (ref 0.7–4.0)
MCH: 27.5 pg (ref 26.0–34.0)
MCHC: 32.2 g/dL (ref 30.0–36.0)
MCV: 85.4 fL (ref 80.0–100.0)
Monocytes Absolute: 0.7 10*3/uL (ref 0.1–1.0)
Monocytes Relative: 8 %
Neutro Abs: 6 10*3/uL (ref 1.7–7.7)
Neutrophils Relative %: 66 %
Platelets: 226 10*3/uL (ref 150–400)
RBC: 4.58 MIL/uL (ref 4.22–5.81)
RDW: 15 % (ref 11.5–15.5)
WBC: 9.1 10*3/uL (ref 4.0–10.5)
nRBC: 0 % (ref 0.0–0.2)

## 2019-01-02 LAB — URINALYSIS, COMPLETE (UACMP) WITH MICROSCOPIC
Bacteria, UA: NONE SEEN
Bilirubin Urine: NEGATIVE
Glucose, UA: >=500 mg/dL — AB
Ketones, ur: NEGATIVE mg/dL
Leukocytes,Ua: NEGATIVE
Nitrite: NEGATIVE
Protein, ur: 30 mg/dL — AB
Specific Gravity, Urine: 1.013 (ref 1.005–1.030)
Squamous Epithelial / HPF: NONE SEEN (ref 0–5)
pH: 5 (ref 5.0–8.0)

## 2019-01-02 LAB — BASIC METABOLIC PANEL
Anion gap: 10 (ref 5–15)
BUN: 37 mg/dL — ABNORMAL HIGH (ref 8–23)
CO2: 26 mmol/L (ref 22–32)
Calcium: 9.2 mg/dL (ref 8.9–10.3)
Chloride: 101 mmol/L (ref 98–111)
Creatinine, Ser: 1.69 mg/dL — ABNORMAL HIGH (ref 0.61–1.24)
GFR calc Af Amer: 44 mL/min — ABNORMAL LOW (ref >60)
GFR calc non Af Amer: 38 mL/min — ABNORMAL LOW (ref >60)
Glucose, Bld: 285 mg/dL — ABNORMAL HIGH (ref 70–99)
Potassium: 4 mmol/L (ref 3.5–5.1)
Sodium: 137 mmol/L (ref 135–145)

## 2019-01-02 NOTE — ED Provider Notes (Signed)
[facility] Emergency Department Provider Note   ____________________________________________   First MD Initiated Contact with Patient 01/02/19 2307     (approximate)  I have reviewed the triage vital signs and the nursing notes.   HISTORY  Chief Complaint Hematuria    HPI Kyle Roberson is a 78 y.o. male who presents to the ED from home with a chief complaint of pink-tinged urine today.  Patient has had similar symptoms previously.  Reports previous history of kidney stones requiring stents.  Denies fever, cough, chest pain, shortness of breath, abdominal or flank pain, nausea, vomiting or dysuria.  Denies testicular pain or swelling.  Takes a baby aspirin daily.  Denies recent travel or trauma.        Past Medical History:  Diagnosis Date  . AKI (acute kidney injury) ([place]) 07/06/2018  . CAD (coronary artery disease)   . Diabetes ([place])   . HLD (hyperlipidemia)   . HTN (hypertension)     Patient Active Problem List   Diagnosis Date Noted  . GERD (gastroesophageal reflux disease) 08/02/2018  . Erectile dysfunction 08/01/2018  . History of colonic diverticulitis 08/01/2018  . 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 Obesity (BMI 30-39.9) 08/01/2018  . Venous insufficiency 08/01/2018  . A-fib ([place]) 08/01/2018  . 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 HTN (hypertension) 07/06/2018  . Hyperlipidemia associated with type 2 diabetes mellitus ([place]) 07/06/2018  . Type II diabetes mellitus with complication ([place]) [id number]  . Coronary artery disease involving native coronary artery of native heart without angina pectoris 06/05/2018  . 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[place]) 01/30/2017  . Vitamin D deficiency 01/30/2017  . Benign non-nodular prostatic hyperplasia with lower urinary tract symptoms 09/29/2014  . CKD stage 3 due to type 2 diabetes mellitus ([place]) 06/27/2014  . History of MI (myocardial infarction) 06/27/2014  . Nephrolithiasis  01/04/2012    Past Surgical History:  Procedure Laterality Date  . CYSTOSCOPY    . HERNIA REPAIR      Prior to Admission medications   Medication Sig Start Date End Date Taking? Authorizing Provider  amLODipine (NORVASC) 10 MG tablet Take 10 mg by mouth daily. 06/01/18 06/01/19  [provider]  Ascorbic Acid (VITAMIN C) 1000 MG tablet Take 1,000 mg by mouth daily.    [provider]  aspirin EC 81 MG tablet Take 81 mg by mouth daily.    [provider]  cholecalciferol (VITAMIN D3) 25 MCG (1000 UT) tablet Take 1,000 Units by mouth daily.    [provider]  Flaxseed, Linseed, (FLAX SEED OIL) 1000 MG CAPS Take by mouth.    [provider]  furosemide (LASIX) 20 MG tablet Take 1 tablet (20 mg total) by mouth every other day. 11/08/18   Glean Hess, MD  glipiZIDE (GLUCOTROL XL) 10 MG 24 hr tablet Take 10 mg by mouth 2 (two) times daily.  06/01/18 06/01/19  [provider]  losartan-hydrochlorothiazide (HYZAAR) 50-12.5 MG tablet Take 1 tablet by mouth daily.    [provider]  Multiple Vitamins-Minerals (MULTIVITAMIN WITH MINERALS) tablet Take 1 tablet by mouth daily.    [provider]  nitroGLYCERIN (NITROLINGUAL) 0.4 MG/SPRAY spray Place 1 spray under the tongue as directed. 06/30/18 06/30/19  [provider]  Omega 3 1000 MG CAPS Take 1 capsule by mouth daily. 04/14/06   [provider]  pravastatin (PRAVACHOL) 20 MG tablet Take 20 mg by mouth every other day. Causes leg cramps so does not take everyday. 06/01/18 06/01/19  [provider]  silver sulfADIAZINE (SILVADENE) 1 % cream Apply 1 application topically 2 (two) times daily. 06/30/18 06/30/19  [provider]    Allergies Atorvastatin, Rosuvastatin, and Simvastatin  Family History  Problem Relation Age of Onset  . Pancreatic cancer Mother   . CAD Father   . Diabetes Brother     Social History Social History   Tobacco Use   . Smoking status: Former Research scientist (life sciences)  . Smokeless tobacco: Never Used  Substance Use Topics  . Alcohol use: Yes  . Drug use: Not Currently    Review of Systems  Constitutional: No fever/chills Eyes: No visual changes. ENT: No sore throat. Cardiovascular: Denies chest pain. Respiratory: Denies shortness of breath. Gastrointestinal: No abdominal pain.  No nausea, no vomiting.  No diarrhea.  No constipation. Genitourinary: Positive for hematuria.  Negative for dysuria. Musculoskeletal: Negative for back pain. Skin: Negative for rash. Neurological: Negative for headaches, focal weakness or numbness.   ____________________________________________   PHYSICAL EXAM:  VITAL SIGNS: ED Triage Vitals  Enc Vitals Group     BP 01/02/19 2216 (!) 175/86     Pulse Rate 01/02/19 2216 77     Resp 01/02/19 2216 18     Temp 01/02/19 2216 98 F (36.7 C)     Temp Source 01/02/19 2216 Oral     SpO2 01/02/19 2216 98 %     Weight 01/02/19 2126 240 lb (108.9 kg)     Height 01/02/19 2126 5\' 8"  (1.727 m)     Head Circumference --      Peak Flow --      Pain Score 01/02/19 2126 0     Pain Loc --      Pain Edu? --      Excl. in [place]? --     Constitutional: Alert and oriented. Well appearing and in no acute distress. Eyes: Conjunctivae are normal. PERRL. EOMI. Head: Atraumatic. Nose: No congestion/rhinnorhea. Mouth/Throat: Mucous membranes are moist.  Oropharynx non-erythematous. Neck: No stridor.   Cardiovascular: Normal rate, regular rhythm. Grossly normal heart sounds.  Good peripheral circulation. Respiratory: Normal respiratory effort.  No retractions. Lungs CTAB. Gastrointestinal: Soft and nontender to light or deep palpation. No distention. No abdominal bruits. No CVA tenderness. Musculoskeletal: No lower extremity tenderness nor edema.  No joint effusions. Neurologic:  Normal speech and language. No gross focal neurologic deficits are appreciated. No gait instability. Skin:  Skin is warm,  dry and intact. No rash noted. Psychiatric: Mood and affect are normal. Speech and behavior are normal.  ____________________________________________   LABS (all labs ordered are listed, but only abnormal results are displayed)  Labs Reviewed  URINALYSIS, COMPLETE (UACMP) WITH MICROSCOPIC - Abnormal; Notable for the following components:      Result Value   Color, Urine YELLOW (*)    APPearance CLOUDY (*)    Glucose, UA >=500 (*)    Hgb urine dipstick MODERATE (*)    Protein, ur 30 (*)    All other components within normal limits  CBC WITH DIFFERENTIAL/PLATELET - Abnormal; Notable for the following components:   Hemoglobin 12.6 (*)    All other components within normal limits  BASIC METABOLIC PANEL - Abnormal; Notable for the following components:   Glucose, Bld 285 (*)    BUN 37 (*)    Creatinine, Ser 1.69 (*)    GFR calc non Af Amer 38 (*)    GFR calc Af Amer 44 (*)    All other components within normal limits  URINE CULTURE   ____________________________________________  EKG  None ____________________________________________  RADIOLOGY  ED MD interpretation: Unremarkable CT scan  Official radiology report(s): Ct Renal Stone Study  Result Date: 01/02/2019 CLINICAL DATA:  78 year old male with hematuria. EXAM: CT ABDOMEN AND PELVIS WITHOUT CONTRAST TECHNIQUE: Multidetector CT imaging of the abdomen and pelvis was performed following the standard protocol without IV contrast. COMPARISON:  Abdominal radiograph dated 07/08/2018 FINDINGS: Evaluation of this exam is limited in the absence of intravenous contrast. Lower chest: The visualized lung bases are clear. There is multi vessel coronary vascular calcification. There is hypoattenuation of the cardiac blood pool suggestive of a degree of anemia. Clinical correlation is recommended. No intra-abdominal free air or free fluid. Hepatobiliary: There is a 2 cm hypodense lesion in the left lobe of the liver which is not well  characterized but may represent a cyst or hemangioma. No intrahepatic biliary ductal dilatation. The gallbladder is unremarkable. Pancreas: Unremarkable. No pancreatic ductal dilatation or surrounding inflammatory changes. Spleen: Normal in size without focal abnormality. Adrenals/Urinary Tract: The adrenal glands are unremarkable. Mild bilateral renal parenchyma atrophy. There is no hydronephrosis or nephrolithiasis on either side. The visualized ureters and urinary bladder appear unremarkable. Stomach/Bowel: There is extensive sigmoid diverticulosis and scattered colonic diverticula without active inflammatory changes. There is no bowel obstruction or active inflammation. Normal caliber fecalized loops of distal small bowel noted which may represent increased transit time or small intestinal bacterial overgrowth. Clinical correlation is recommended. The appendix is normal. Vascular/Lymphatic: Advanced aortoiliac atherosclerotic disease. The IVC is grossly unremarkable on this noncontrast CT. No portal venous gas. There is no adenopathy. Reproductive: Enlarged prostate gland measuring approximately 5.5 cm in transverse axial diameter. Other: Mild diffuse subcutaneous edema. There is a small fat containing hernia with induration of the periumbilical subcutaneous fat. No fluid collection. Musculoskeletal: Degenerative changes of the spine. No acute osseous pathology. IMPRESSION: 1. No acute intra-abdominal or pelvic pathology. No hydronephrosis or nephrolithiasis. 2. Severe sigmoid diverticulosis. No bowel obstruction or active inflammation. Normal appendix. Aortic Atherosclerosis (ICD10-I70.0). Electronically Signed   By: Anner Crete M.D.   On: 01/02/2019 23:48    ____________________________________________   PROCEDURES  Procedure(s) performed (including Critical Care):  Procedures   ____________________________________________   INITIAL IMPRESSION / ASSESSMENT AND PLAN / ED COURSE  As part  of my medical decision making, I reviewed the following data within the [facility] notes reviewed and incorporated, Labs reviewed, Old chart reviewed, Radiograph reviewed and Notes from prior ED visits     Kyle Roberson was evaluated in Emergency Department on 01/02/2019 for the symptoms described in the history of present illness. He was evaluated in the context of the global COVID-19 pandemic, which necessitated consideration that the patient might be at risk for infection with the SARS-CoV-2 virus that causes COVID-19. Institutional protocols and algorithms that pertain to the evaluation of patients at risk for COVID-19 are in a state of rapid change based on information released by regulatory bodies including the CDC and federal and state organizations. These policies and algorithms were followed during the patient's care in the ED.    78 year old male who presents with hematuria. Differential diagnosis includes, but is not limited to, acute appendicitis, renal colic, testicular torsion, urinary tract infection/pyelonephritis, prostatitis,  epididymitis, diverticulitis, small bowel obstruction or ileus, colitis, abdominal aortic aneurysm, gastroenteritis, hernia, etc.  Laboratory results reveal stable renal insufficiency, microscopic hematuria.  Will obtain CT renal colic study reassess.   Clinical Course as of Jan 01 2354  Mon Jan 02, 2019  2352 Updated patient of CT results.  Urine culture has been added.  Patient will follow up with his urologist as needed.  Strict return precautions given.  Patient verbalizes understanding agrees with plan of care.   [JS]    Clinical Course User Index [JS] Paulette Blanch, MD     ____________________________________________   FINAL CLINICAL IMPRESSION(S) / ED DIAGNOSES  Final diagnoses:  Hematuria, unspecified type     ED Discharge Orders    None       Note:  This document was prepared using Dragon voice  recognition software and may include unintentional dictation errors.   Paulette Blanch, MD 01/03/19 [phone]

## 2019-01-02 NOTE — Discharge Instructions (Addendum)
Urine culture is pending.  You will be notified of any positive results requiring antibiotics.  Hold aspirin x3 days, then resume.  Return to the ER for worsening symptoms, persistent vomiting, difficulty breathing or other concerns.

## 2019-01-02 NOTE — ED Triage Notes (Signed)
Pt arrives to ED via POV from home with c/o hematuria x1 day. Pt reports some pink-tinge to his urine this AM when he woke up and went to the BR. Pt reports symptoms have been intermittent throughout the day (says "sometimes I see blood in my urine and sometimes I don't"). Pt reports previous h/x of kidney stones. Pt denies any c/o pain or burning with urination. No back or flank pain; no c/o N/V/D or fever; no SHOB or CP.

## 2019-01-02 NOTE — ED Notes (Signed)
PT denies any pain or frequency with urination. NO abd or back pain. No nausea vomiting.

## 2019-01-02 NOTE — ED Notes (Signed)
PT is in CT

## 2019-01-04 LAB — URINE CULTURE

## 2019-01-10 ENCOUNTER — Encounter: Payer: Self-pay | Admitting: Internal Medicine

## 2019-01-10 ENCOUNTER — Ambulatory Visit (INDEPENDENT_AMBULATORY_CARE_PROVIDER_SITE_OTHER): Payer: Medicare HMO | Admitting: Internal Medicine

## 2019-01-10 ENCOUNTER — Other Ambulatory Visit: Payer: Self-pay

## 2019-01-10 VITALS — BP 128/78 | HR 69 | Ht 68.0 in | Wt 253.0 lb

## 2019-01-10 DIAGNOSIS — E118 Type 2 diabetes mellitus with unspecified complications: Secondary | ICD-10-CM | POA: Diagnosis not present

## 2019-01-10 DIAGNOSIS — N401 Enlarged prostate with lower urinary tract symptoms: Secondary | ICD-10-CM

## 2019-01-10 DIAGNOSIS — N1831 Chronic kidney disease, stage 3a: Secondary | ICD-10-CM

## 2019-01-10 DIAGNOSIS — N2581 Secondary hyperparathyroidism of renal origin: Secondary | ICD-10-CM | POA: Diagnosis not present

## 2019-01-10 DIAGNOSIS — I2510 Atherosclerotic heart disease of native coronary artery without angina pectoris: Secondary | ICD-10-CM

## 2019-01-10 DIAGNOSIS — R319 Hematuria, unspecified: Secondary | ICD-10-CM | POA: Diagnosis not present

## 2019-01-10 LAB — POCT URINALYSIS DIPSTICK
Bilirubin, UA: NEGATIVE
Blood, UA: NEGATIVE
Glucose, UA: POSITIVE — AB
Ketones, UA: NEGATIVE
Leukocytes, UA: NEGATIVE
Nitrite, UA: NEGATIVE
Protein, UA: POSITIVE — AB
Spec Grav, UA: 1.02 (ref 1.010–1.025)
Urobilinogen, UA: 0.2 E.U./dL
pH, UA: 5 (ref 5.0–8.0)

## 2019-01-10 MED ORDER — ROSUVASTATIN CALCIUM 5 MG PO TABS: 5.0000 mg | ORAL_TABLET | ORAL | 1 refills | Status: DC

## 2019-01-10 NOTE — Progress Notes (Signed)
Date:  01/10/2019   Name:  Kyle Roberson   DOB:  1940/09/11   MRN:  [id number]   Chief Complaint: Hematuria (Follow up. Has hx of kidney stones in past. Sigmoid colon is inflammed with basteria pockets. Pressing against urethra causing blood. No evidence of kidney stones at this time. ) and Immunizations (High dose flu shot.)  Hematuria This is a new problem. The current episode started 1 to 4 weeks ago. The problem has been resolved since onset. He describes the hematuria as gross hematuria. Irritative symptoms do not include frequency. Pertinent negatives include no abdominal pain, chills or fever.  Diabetes He presents for his follow-up diabetic visit. He has type 2 diabetes mellitus. Pertinent negatives for hypoglycemia include no dizziness, headaches or nervousness/anxiousness. Pertinent negatives for diabetes include no chest pain and no fatigue. Symptoms are improving (glucoses are slightly better but generally over 200). Current diabetic treatment includes oral agent (monotherapy) and insulin injections. He is compliant with treatment most of the time. His weight is stable. An ACE inhibitor/angiotensin II receptor blocker is being taken.  CT 01/02/2019: IMPRESSION: 1. No acute intra-abdominal or pelvic pathology. No hydronephrosis or nephrolithiasis. 2. Severe sigmoid diverticulosis. No bowel obstruction or active inflammation. Normal appendix. CAD - he is still having LE edema and sleeping in his recliner  However he is walking on his own and denies chest pain.  He does have wheezing at night.  He has been urged to see Cardiology but has not yet been seen. BPH - enlarged on CT, recent PSA was normal. Pt denies obstructive symptoms.  No further workup for now. CKD - this has been essentially stable over the past year.  Edema fairly well controlled with lasix.   Review of Systems  Constitutional: Negative for chills, diaphoresis, fatigue and fever.  HENT: Negative for trouble  swallowing.   Eyes: Negative for visual disturbance.  Respiratory: Negative for cough, chest tightness, shortness of breath and wheezing.   Cardiovascular: Positive for leg swelling. Negative for chest pain and palpitations.  Gastrointestinal: Negative for abdominal pain and constipation.  Genitourinary: Negative for frequency, hematuria and testicular pain.  Musculoskeletal: Positive for gait problem.  Neurological: Negative for dizziness, light-headedness and headaches.  Psychiatric/Behavioral: Negative for dysphoric mood and sleep disturbance. The patient is not nervous/anxious.      Patient Active Problem List   Diagnosis Date Noted  . GERD (gastroesophageal reflux disease) 08/02/2018  . Erectile dysfunction 08/01/2018  . History of colonic diverticulitis 08/01/2018  . 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 Obesity (BMI 30-39.9) 08/01/2018  . Venous insufficiency 08/01/2018  . A-fib ([place]) 08/01/2018  . 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 HTN (hypertension) 07/06/2018  . Hyperlipidemia associated with type 2 diabetes mellitus ([place]) 07/06/2018  . Type II diabetes mellitus with complication ([place]) [id number]  . Coronary artery disease involving native coronary artery of native heart without angina pectoris 06/05/2018  . 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[place]) 01/30/2017  . Vitamin D deficiency 01/30/2017  . Benign non-nodular prostatic hyperplasia with lower urinary tract symptoms 09/29/2014  . Chronic kidney disease, stage 3a 06/27/2014  . History of MI (myocardial infarction) 06/27/2014  . Nephrolithiasis 01/04/2012    Allergies  Allergen Reactions  . Atorvastatin Other (See Comments)  . Rosuvastatin Other (See Comments)  . Simvastatin Other (See Comments)    Past Surgical History:  Procedure Laterality Date  . CYSTOSCOPY    . HERNIA REPAIR      Social History   Tobacco Use  . Smoking status:  Former Smoker  . Smokeless tobacco: Never Used  Substance Use Topics   . Alcohol use: Yes  . Drug use: Not Currently     Medication list has been reviewed and updated.  Current Meds  Medication Sig  . amLODipine (NORVASC) 10 MG tablet Take 10 mg by mouth daily.  . Ascorbic Acid (VITAMIN C) 1000 MG tablet Take 1,000 mg by mouth daily.  Marland Kitchen aspirin EC 81 MG tablet Take 81 mg by mouth daily.  . cholecalciferol (VITAMIN D3) 25 MCG (1000 UT) tablet Take 1,000 Units by mouth daily.  . Flaxseed, Linseed, (FLAX SEED OIL) 1000 MG CAPS Take by mouth.  . furosemide (LASIX) 20 MG tablet Take 1 tablet (20 mg total) by mouth every other day.  Marland Kitchen glipiZIDE (GLUCOTROL XL) 10 MG 24 hr tablet Take 10 mg by mouth 2 (two) times daily.   . insulin NPH Human (NOVOLIN N) 100 UNIT/ML injection Inject 5-10 Units into the skin daily. 5 units in the AM and 10 units in the PM  . losartan-hydrochlorothiazide (HYZAAR) 50-12.5 MG tablet Take 1 tablet by mouth daily.  . Multiple Vitamins-Minerals (MULTIVITAMIN WITH MINERALS) tablet Take 1 tablet by mouth daily.  . nitroGLYCERIN (NITROLINGUAL) 0.4 MG/SPRAY spray Place 1 spray under the tongue as directed.  . Omega 3 1000 MG CAPS Take 1 capsule by mouth daily.  . pravastatin (PRAVACHOL) 20 MG tablet Take 20 mg by mouth every other day. Causes leg cramps so does not take everyday.  . silver sulfADIAZINE (SILVADENE) 1 % cream Apply 1 application topically 2 (two) times daily.    PHQ 2/9 Scores 01/10/2019 11/08/2018 08/02/2018  PHQ - 2 Score 0 0 0  PHQ- 9 Score - - 4    BP Readings from Last 3 Encounters:  01/10/19 128/78  01/03/19 (!) 156/81  11/08/18 122/78    Physical Exam Vitals signs and nursing note reviewed.  Constitutional:      General: He is not in acute distress.    Appearance: He is well-developed.  HENT:     Head: Normocephalic and atraumatic.  Neck:     Musculoskeletal: Normal range of motion and neck supple.  Cardiovascular:     Rate and Rhythm: Normal rate and regular rhythm.  Pulmonary:     Effort: Pulmonary  effort is normal. No respiratory distress.  Abdominal:     Palpations: Abdomen is soft.     Tenderness: There is no abdominal tenderness.  Musculoskeletal: Normal range of motion.     Right lower leg: Edema present.     Left lower leg: Edema present.  Lymphadenopathy:     Cervical: No cervical adenopathy.  Skin:    General: Skin is warm and dry.     Findings: No rash.  Neurological:     Mental Status: He is alert and oriented to person, place, and time.  Psychiatric:        Behavior: Behavior normal.        Thought Content: Thought content normal.     Wt Readings from Last 3 Encounters:  01/10/19 253 lb (114.8 kg)  01/02/19 240 lb (108.9 kg)  11/08/18 242 lb (109.8 kg)    BP 128/78   Pulse 69   Ht 5\' 8"  (1.727 m)   Wt 253 lb (114.8 kg)   SpO2 97%   BMI 38.47 kg/m   Assessment and Plan: 1. Type II diabetes mellitus with complication (HCC) Pt is now on bid NPH insulin - 10 u AM and 5u PM  and has not increased the dose. BS are still over 200 Increase dose to 15 u AM and 10 U PM Continue glipizide 10 mg bid  2. Hematuria, unspecified type UA negative today - POCT urinalysis dipstick  3. Benign non-nodular prostatic hyperplasia with lower urinary tract symptoms BPH noted on recent CT Recent PSA was normal No further workup needed at this time  4. Chronic kidney disease, stage 3a This has been essentially stable over the past year Will continue to monitor - refer to Nephrology if worsening  5. 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 Coronary artery disease involving native coronary artery of native heart without angina pectoris Clinically stable but with persistent edema and SOB; now with some nocturnal wheezing concerning for worsening HF Continue current medications - losartan, lasix and aspirin Will try high intensity statin three times a week for lipid lowering (pt motivated by recent coronary atherosclerosis seen on CT) - rosuvastatin (CRESTOR) 5  MG tablet; Take 1 tablet (5 mg total) by mouth 3 (three) times a week.  Dispense: 36 tablet; Refill: 1   Partially dictated using Editor, commissioning. Any errors are unintentional.  Halina Maidens, MD [facility]  01/10/2019

## 2019-01-10 NOTE — Patient Instructions (Addendum)
Increase the insulin by 5 units morning and evening every three weeks until blood sugar is generally around 160-170.  [facility]

## 2019-02-27 ENCOUNTER — Ambulatory Visit: Payer: Medicare HMO | Admitting: Cardiology

## 2019-03-02 ENCOUNTER — Ambulatory Visit: Payer: BC Managed Care – PPO | Admitting: Internal Medicine

## 2019-03-21 NOTE — Progress Notes (Signed)
New Outpatient Visit Date: 03/22/2019  Referring Provider: Glean Hess, MD [address] [place],  [place] [address]  Chief Complaint: Leg swelling  HPI:  Kyle Roberson is a 78 y.o. male who is being seen today for the evaluation of leg swelling at the request of Dr. Army Melia. He has a history of CAD, persistent atrial fibrillation, hypertension, hyperlipidemia, type 2 diabetes mellitus, chronic kidney disesae, GERD and hyperparathyroidism. He was seen once by Dr. Laurence Aly at [facility] in 2016.  He has a history of remote stenting of the LAD and angioplasty of the diagonal branch as well as moderate RCA disease.  Kyle Roberson had a protracted UTI at the beginning of 2020 that ultimately led to hospitalization for urosepsis in April.  He had profound weakness and continues to recover in that regard.  Over the last several months, he has experienced significant swelling in both feet and ankles.  The edema improves with elevation but does not completely resolve.  He has also notices some improvement since furosemide 20 mg daily was added.  He denies shortness of the breath and orthopnea, though he has been sleeping in a recliner most of the time since his urosepsis hospitalization earlier this year, as he was initially too weak to get out of bed on his own.  His weight fluctuates by 5-10 pounds but overall has been stable over the last several months.  He has not experienced chest pain, palpitations, and lightheadedness.  Kyle Roberson reports first being diagnosed with atrial fibrillation around the time of his hospitalization in April.  He stats that Dr. Army Melia has since told him that it has resolved.  He is currently on low-dose aspirin and has not experienced any significant bleeding.  --------------------------------------------------------------------------------------------------  Cardiovascular History & Procedures: Cardiovascular Problems:  Coronary artery disease  Atrial  fibrillation  Risk Factors:  Known CAD, hypertension, hyperlipidemia, diabetes mellitus, male gender, obesity, and age > 9  Cath/PCI:  None available.  CV Surgery:  None  EP Procedures and Devices:  None  Non-Invasive Evaluation(s):  TTE (07/07/2018): Nromal LV size with LVEF 60-65%.  Unable to assess diastolic function.  Mildly reduced RV function with mild RV dilation and thickening.  Aortic sclerosis noted.  Unable to assess aortic regurgitation.  Recent CV Pertinent Labs: Lab Results  Component Value Date   CHOL 137 07/22/2018   HDL 44 07/22/2018   LDLCALC 73 07/22/2018   TRIG 116 07/22/2018   K 4.0 01/02/2019   BUN 37 (H) 01/02/2019   BUN 56 (A) 07/22/2018   CREATININE 1.69 (H) 01/02/2019    --------------------------------------------------------------------------------------------------  Past Medical History:  Diagnosis Date  . AKI (acute kidney injury) ([place]) 07/06/2018  . CAD (coronary artery disease)   . Diabetes ([place])   . HLD (hyperlipidemia)   . HTN (hypertension)     Past Surgical History:  Procedure Laterality Date  . CYSTOSCOPY    . HERNIA REPAIR      Current Meds  Medication Sig  . amLODipine (NORVASC) 10 MG tablet Take 10 mg by mouth daily.  . Ascorbic Acid (VITAMIN C) 1000 MG tablet Take 1,000 mg by mouth daily.  Marland Kitchen aspirin EC 81 MG tablet Take 81 mg by mouth daily.  . cholecalciferol (VITAMIN D3) 25 MCG (1000 UT) tablet Take 1,000 Units by mouth daily.  . Flaxseed, Linseed, (FLAX SEED OIL) 1000 MG CAPS Take by mouth.  . furosemide (LASIX) 20 MG tablet Take 1 tablet (20 mg total) by mouth every other  day. (Patient taking differently: Take 20 mg by mouth daily. )  . glipiZIDE (GLUCOTROL XL) 10 MG 24 hr tablet Take 10 mg by mouth 2 (two) times daily.   . insulin NPH Human (NOVOLIN N) 100 UNIT/ML injection Inject 5-10 Units into the skin daily. 5 units in the AM and 10 units in the PM  . losartan-hydrochlorothiazide (HYZAAR) 50-12.5 MG tablet  Take 1 tablet by mouth daily.  . Multiple Vitamins-Minerals (MULTIVITAMIN WITH MINERALS) tablet Take 1 tablet by mouth daily.  . nitroGLYCERIN (NITROLINGUAL) 0.4 MG/SPRAY spray Place 1 spray under the tongue as directed.  . Omega 3 1000 MG CAPS Take 1 capsule by mouth daily.  . rosuvastatin (CRESTOR) 5 MG tablet Take 1 tablet (5 mg total) by mouth 3 (three) times a week.  . silver sulfADIAZINE (SILVADENE) 1 % cream Apply 1 application topically 2 (two) times daily.    Allergies: Atorvastatin and Simvastatin  Social History   Tobacco Use  . Smoking status: Former Research scientist (life sciences)  . Smokeless tobacco: Never Used  Substance Use Topics  . Alcohol use: Yes  . Drug use: Not Currently    Family History  Problem Relation Age of Onset  . Pancreatic cancer Mother   . CAD Father   . Diabetes Brother     Review of Systems: A 12-system review of systems was performed and was negative except as noted in the HPI.  --------------------------------------------------------------------------------------------------  Physical Exam: BP (!) 160/80 (BP Location: Right Arm, Patient Position: Sitting, Cuff Size: Normal)   Pulse 65   Ht 5\' 8"  (1.727 m)   Wt 257 lb 1.9 oz (116.6 kg)   SpO2 98%   BMI 39.09 kg/m   General:  NAD HEENT: No conjunctival pallor or scleral icterus. Facemask in place. Neck: Supple without lymphadenopathy, thyromegaly, JVD, or HJR. No carotid bruit. Lungs: Normal work of breathing. Clear to auscultation bilaterally without wheezes or crackles. Heart: Irregularly irregular without murmurs or rubs. Abd: Bowel sounds present. Soft, NT/ND without hepatosplenomegaly Ext: 2-3+ pitting edema in both legs Skin: Warm and dry.  Bilateral calf skin changes consistent with chronic edema. Neuro: CNIII-XII intact. Strength and fine-touch sensation intact in upper and lower extremities bilaterally. Psych: Normal mood and affect.  EKG:  Atrial fibrillation with PVC's versus aberrancy, septal  Q waves, and low voltage.  Lab Results  Component Value Date   WBC 9.1 01/02/2019   HGB 12.6 (L) 01/02/2019   HCT 39.1 01/02/2019   MCV 85.4 01/02/2019   PLT 226 01/02/2019    Lab Results  Component Value Date   NA 137 01/02/2019   K 4.0 01/02/2019   CL 101 01/02/2019   CO2 26 01/02/2019   BUN 37 (H) 01/02/2019   CREATININE 1.69 (H) 01/02/2019   GLUCOSE 285 (H) 01/02/2019   ALT 12 11/08/2018    Lab Results  Component Value Date   CHOL 137 07/22/2018   HDL 44 07/22/2018   LDLCALC 73 07/22/2018   TRIG 116 07/22/2018   Lab Results  Component Value Date   TSH 1.568 08/02/2018   --------------------------------------------------------------------------------------------------  ASSESSMENT AND PLAN: Lower extremity edema: This is likely multifactorial but certainly could have an element of left-sided diastolic and right heart failure, as echo in April was notable for mildly dilated and weakened RV.  Kyle Roberson has significant pretibial edema on exam today.  Weight is also up 15-20 pounds since August.  I have recommended that we check a BMP today and increase furosemide to 40 mg daily.  Continued leg elevation as well as use of OTC compression stockings was also suggested.  Persistent atrial fibrillation: EKG today is degraded by baseline artifact, though it is most consistent with atrial fibrillation with PVC's versus aberrance.  Outpatient EKG obtained by Dr. Army Melia in 07/2018 also demonstrates atrial fibrillation.  As ventricular rates are adequately controlled, I do not thing that an AV-nodal blocking agent is needed at this time.  I have recommended anticoagulation in lieu of aspirin, given a CHADSVASC score of at least (age x 5, CAD, HTN, and DM).  Kyle Roberson is not interested and warfarin and would like to think more about a NOAC before starting therapy.  I will check a CBC and CMP to ensure that baseline labs are appropriate for anticoagulation.  I have provided him  with information regarding apixaban and rivaroxaban.  We will readdress anticoagulation when he returns for follow-up.  No plans for rhythm control until Kyle Roberson has decided about anticoagulation.  Coronary artery disease: No symptoms to suggest worsening coronary insufficiency.  Echo in April was without regional wall motion abnormality.  Hypertension: BP moderately elevated today.  I have recommended increasing furosemide, as above.  No other medication changes at this time.  Hyperlipidemia: LDL just above goal on last check in May (73; goal < 70).  Continue rosuvastatin 5 mg on M, W, F for now.  LDL will need to be rechecked in the next few months with escalation of rosuvastatin (as tolerated) should LDL remain above goal.  Follow-up: Return to clinic in 2 weeks.  Nelva Bush, MD 03/22/2019 2:25 PM

## 2019-03-22 ENCOUNTER — Encounter: Payer: Self-pay | Admitting: Internal Medicine

## 2019-03-22 ENCOUNTER — Other Ambulatory Visit: Payer: Self-pay

## 2019-03-22 ENCOUNTER — Ambulatory Visit (INDEPENDENT_AMBULATORY_CARE_PROVIDER_SITE_OTHER): Payer: Medicare HMO | Admitting: Internal Medicine

## 2019-03-22 VITALS — BP 160/80 | HR 65 | Ht 68.0 in | Wt 257.1 lb

## 2019-03-22 DIAGNOSIS — R600 Localized edema: Secondary | ICD-10-CM | POA: Diagnosis not present

## 2019-03-22 DIAGNOSIS — E785 Hyperlipidemia, unspecified: Secondary | ICD-10-CM

## 2019-03-22 DIAGNOSIS — I10 Essential (primary) hypertension: Secondary | ICD-10-CM | POA: Diagnosis not present

## 2019-03-22 DIAGNOSIS — I2510 Atherosclerotic heart disease of native coronary artery without angina pectoris: Secondary | ICD-10-CM | POA: Diagnosis not present

## 2019-03-22 DIAGNOSIS — I4819 Other persistent atrial fibrillation: Secondary | ICD-10-CM | POA: Diagnosis not present

## 2019-03-22 MED ORDER — FUROSEMIDE 40 MG PO TABS: 40.0000 mg | ORAL_TABLET | Freq: Every day | ORAL | 3 refills | Status: DC

## 2019-03-22 NOTE — Patient Instructions (Addendum)
Medication Instructions:  Your physician has recommended you make the following change in your medication:  1- INCREASE Furosemide to 40 mg by mouth once a day.  *If you need a refill on your cardiac medications before your next appointment, please call your pharmacy*  Lab Work: Your physician recommends that you return for lab work in: Kyle Roberson, CBC.   If you have labs (blood work) drawn today and your tests are completely normal, you will receive your results only by: Marland Kitchen MyChart Message (if you have MyChart) OR . A paper copy in the mail If you have any lab test that is abnormal or we need to change your treatment, we will call you to review the results.  Testing/Procedures: none  Follow-Up: At [facility], you and your health needs are our priority.  As part of our continuing mission to provide you with exceptional heart care, we have created designated Provider Care Teams.  These Care Teams include your primary Cardiologist (physician) and Advanced Practice Providers (APPs -  Physician Assistants and Nurse Practitioners) who all work together to provide you with the care you need, when you need it.  Your next appointment:   2 week(s)  The format for your next appointment:   In Person  Provider:    You may see DR Harrell Gave END or one of the following Advanced Practice Providers on your designated Care Team:    Murray Hodgkins, NP  Christell Faith, PA-C  Marrianne Mood, PA-C   Other Instructions You will be provided with handouts on Xarelto and Eliquis and the DASH diet.

## 2019-03-23 ENCOUNTER — Telehealth: Payer: Self-pay | Admitting: *Deleted

## 2019-03-23 DIAGNOSIS — R600 Localized edema: Secondary | ICD-10-CM | POA: Insufficient documentation

## 2019-03-23 DIAGNOSIS — I890 Lymphedema, not elsewhere classified: Secondary | ICD-10-CM | POA: Insufficient documentation

## 2019-03-23 LAB — COMPREHENSIVE METABOLIC PANEL
ALT: 13 IU/L (ref 0–44)
AST: 13 IU/L (ref 0–40)
Albumin/Globulin Ratio: 1.5 (ref 1.2–2.2)
Albumin: 4 g/dL (ref 3.7–4.7)
Alkaline Phosphatase: 79 IU/L (ref 39–117)
BUN/Creatinine Ratio: 22 (ref 10–24)
BUN: 42 mg/dL — ABNORMAL HIGH (ref 8–27)
Bilirubin Total: 0.2 mg/dL (ref 0.0–1.2)
CO2: 22 mmol/L (ref 20–29)
Calcium: 9.9 mg/dL (ref 8.6–10.2)
Chloride: 100 mmol/L (ref 96–106)
Creatinine, Ser: 1.92 mg/dL — ABNORMAL HIGH (ref 0.76–1.27)
GFR calc Af Amer: 37 mL/min/{1.73_m2} — ABNORMAL LOW (ref >59)
GFR calc non Af Amer: 32 mL/min/{1.73_m2} — ABNORMAL LOW (ref >59)
Globulin, Total: 2.6 g/dL (ref 1.5–4.5)
Glucose: 265 mg/dL — ABNORMAL HIGH (ref 65–99)
Potassium: 4.2 mmol/L (ref 3.5–5.2)
Sodium: 138 mmol/L (ref 134–144)
Total Protein: 6.6 g/dL (ref 6.0–8.5)

## 2019-03-23 LAB — CBC
Hematocrit: 40.6 % (ref 37.5–51.0)
Hemoglobin: 13.1 g/dL (ref 13.0–17.7)
MCH: 27.8 pg (ref 26.6–33.0)
MCHC: 32.3 g/dL (ref 31.5–35.7)
MCV: 86 fL (ref 79–97)
Platelets: 233 10*3/uL (ref 150–450)
RBC: 4.72 x10E6/uL (ref 4.14–5.80)
RDW: 14.3 % (ref 11.6–15.4)
WBC: 8 10*3/uL (ref 3.4–10.8)

## 2019-03-23 MED ORDER — LOSARTAN POTASSIUM 50 MG PO TABS: 50.0000 mg | ORAL_TABLET | Freq: Every day | ORAL | 3 refills | Status: DC

## 2019-03-23 NOTE — Telephone Encounter (Signed)
-----   Message from Nelva Bush, MD sent at 03/23/2019  7:06 AM EST ----- Please let Kyle Roberson know that that his creatinine has increased slightly since October.  His electrolytes, kidney function, and blood counts are normal.  Given that he continues to have swelling, I think it is reasonable for him to increase furosemide to 40 mg daily, as discussed yesterday.  However, I suggest that we stop his losartan-HCTZ combination pill and use losartan 50 mg daily (without HCTZ).  He should f/u as planned in ~2 weeks with BMP at that time.

## 2019-03-23 NOTE — Telephone Encounter (Signed)
No answer. VM is full and cannot accept messages at this time.   Released to MyChart as well with message to call me back or send a MyChart message to let me know he received the information. New Rx for losartan 50 mg sent to pharmacy and combo losartan/HCTZ rx removed from med list.

## 2019-03-23 NOTE — Telephone Encounter (Signed)
Patient's POA, [name], calling back.  She verbalized understanding of the results and plan of care to continue furosemide 40 mg daily, stop combo losartan/Hctz tablet and start losartan 50 mg daily. She is aware of f/u in about 2 weeks as scheduled.

## 2019-03-27 ENCOUNTER — Telehealth: Payer: Self-pay | Admitting: Internal Medicine

## 2019-03-27 MED ORDER — APIXABAN 2.5 MG PO TABS: 2.5000 mg | ORAL_TABLET | Freq: Two times a day (BID) | ORAL | 3 refills | Status: DC

## 2019-03-27 NOTE — Telephone Encounter (Signed)
Attempted to call patient. LMTCB 03/27/2019

## 2019-03-27 NOTE — Telephone Encounter (Signed)
Patients significant other calling in regarding a prescription for the patient. States it is not needed to be refilled but wants to speak with a nurse about the medication.

## 2019-03-27 NOTE — Telephone Encounter (Signed)
Incoming call returned from [facility], caregiver.   She reports that it was discussed at last OV, pt would benefit from Eliquis. She said they had needed time to talk it through but have decided they would like to go ahead and start. Rx needs to be sent to [facility] in [place].   Message routed to Dr. Saunders Revel to verify medication and dosage.   Advised pt to call for any further questions or concerns.

## 2019-03-27 NOTE — Telephone Encounter (Signed)
Thank you for the update.  I recommend starting apixaban 2.5 mg BID and following up in the office as previously discussed.  Nelva Bush, MD [facility]

## 2019-04-07 ENCOUNTER — Encounter: Payer: Self-pay | Admitting: Internal Medicine

## 2019-04-07 ENCOUNTER — Other Ambulatory Visit: Payer: Self-pay

## 2019-04-07 ENCOUNTER — Other Ambulatory Visit
Admission: RE | Admit: 2019-04-07 | Discharge: 2019-04-07 | Disposition: A | Discharge status: Home / Self Care | Payer: Medicare HMO | Attending: Internal Medicine | Admitting: Internal Medicine

## 2019-04-07 ENCOUNTER — Ambulatory Visit (INDEPENDENT_AMBULATORY_CARE_PROVIDER_SITE_OTHER): Payer: Medicare HMO | Admitting: Internal Medicine

## 2019-04-07 VITALS — BP 128/78 | HR 58 | Ht 68.0 in | Wt 266.0 lb

## 2019-04-07 DIAGNOSIS — Z23 Encounter for immunization: Secondary | ICD-10-CM

## 2019-04-07 DIAGNOSIS — I10 Essential (primary) hypertension: Secondary | ICD-10-CM

## 2019-04-07 DIAGNOSIS — E118 Type 2 diabetes mellitus with unspecified complications: Secondary | ICD-10-CM

## 2019-04-07 DIAGNOSIS — N1831 Chronic kidney disease, stage 3a: Secondary | ICD-10-CM

## 2019-04-07 DIAGNOSIS — I4819 Other persistent atrial fibrillation: Secondary | ICD-10-CM | POA: Diagnosis not present

## 2019-04-07 DIAGNOSIS — E1122 Type 2 diabetes mellitus with diabetic chronic kidney disease: Secondary | ICD-10-CM | POA: Diagnosis not present

## 2019-04-07 LAB — BASIC METABOLIC PANEL
Anion gap: 10 (ref 5–15)
BUN: 36 mg/dL — ABNORMAL HIGH (ref 8–23)
CO2: 28 mmol/L (ref 22–32)
Calcium: 9.8 mg/dL (ref 8.9–10.3)
Chloride: 102 mmol/L (ref 98–111)
Creatinine, Ser: 2.15 mg/dL — ABNORMAL HIGH (ref 0.61–1.24)
GFR calc Af Amer: 33 mL/min — ABNORMAL LOW (ref >60)
GFR calc non Af Amer: 28 mL/min — ABNORMAL LOW (ref >60)
Glucose, Bld: 173 mg/dL — ABNORMAL HIGH (ref 70–99)
Potassium: 4.5 mmol/L (ref 3.5–5.1)
Sodium: 140 mmol/L (ref 135–145)

## 2019-04-07 LAB — HEMOGLOBIN A1C
Hgb A1c MFr Bld: 10.1 % — ABNORMAL HIGH (ref 4.8–5.6)
Mean Plasma Glucose: 243.17 mg/dL

## 2019-04-07 NOTE — Progress Notes (Signed)
Date:  04/07/2019   Name:  Kyle Roberson   DOB:  08/18/1940   MRN:  [id number]   Chief Complaint: Diabetes (Follow up. Flu shot. )  Diabetes He presents for his follow-up diabetic visit. He has type 2 diabetes mellitus. His disease course has been improving. There are no hypoglycemic associated symptoms. Pertinent negatives for hypoglycemia include no headaches or tremors. Pertinent negatives for diabetes include no chest pain and no fatigue. There are no hypoglycemic complications. Diabetic complications include heart disease. Current diabetic treatments: insulin and glipizide. His weight is stable. He is following a generally healthy diet. An ACE inhibitor/angiotensin II receptor blocker is not being taken.  We started insulin twice a day on a sliding scale last visit.  Atrial fibrillation - seen by Cardiology recently.  He was in Afib at that time. EKG done and eliquis started. He denies bleeding issues, change in SOB or chest pain.  He has a follow up in the next few weeks for additional evaluation.  Lab Results  Component Value Date   CREATININE 1.92 (H) 03/22/2019   BUN 42 (H) 03/22/2019   NA 138 03/22/2019   K 4.2 03/22/2019   CL 100 03/22/2019   CO2 22 03/22/2019   Lab Results  Component Value Date   CHOL 137 07/22/2018   HDL 44 07/22/2018   LDLCALC 73 07/22/2018   TRIG 116 07/22/2018   Lab Results  Component Value Date   TSH 1.568 08/02/2018   Lab Results  Component Value Date   HGBA1C 10.1 (H) 11/08/2018     Review of Systems  Constitutional: Negative for appetite change, fatigue and unexpected weight change.  Eyes: Negative for visual disturbance.  Respiratory: Negative for cough, shortness of breath and wheezing.   Cardiovascular: Negative for chest pain, palpitations and leg swelling.  Gastrointestinal: Negative for abdominal pain and blood in stool.  Genitourinary: Negative for dysuria and hematuria.  Musculoskeletal: Positive for arthralgias and  gait problem.  Skin: Negative for color change and rash.  Neurological: Negative for tremors, numbness and headaches.  Psychiatric/Behavioral: Negative for dysphoric mood and sleep disturbance.    Patient Active Problem List   Diagnosis Date Noted  . Lower extremity edema 03/23/2019  . GERD (gastroesophageal reflux disease) 08/02/2018  . Erectile dysfunction 08/01/2018  . History of colonic diverticulitis 08/01/2018  . 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 Obesity (BMI 30-39.9) 08/01/2018  . Venous insufficiency 08/01/2018  . Persistent atrial fibrillation ([place]) 08/01/2018  . 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 HTN (hypertension) 07/06/2018  . Hyperlipidemia associated with type 2 diabetes mellitus ([place]) 07/06/2018  . Type II diabetes mellitus with complication ([place]) [id number]  . Coronary artery disease involving native coronary artery of native heart without angina pectoris 06/05/2018  . 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[place]) 01/30/2017  . Vitamin D deficiency 01/30/2017  . Benign non-nodular prostatic hyperplasia with lower urinary tract symptoms 09/29/2014  . Chronic kidney disease, stage 3a 06/27/2014  . History of MI (myocardial infarction) 06/27/2014  . Nephrolithiasis 01/04/2012    Allergies  Allergen Reactions  . Atorvastatin Other (See Comments)  . Simvastatin Other (See Comments)    Past Surgical History:  Procedure Laterality Date  . CORONARY STENT INTERVENTION    . CYSTOSCOPY    . HERNIA REPAIR      Social History   Tobacco Use  . Smoking status: Former Research scientist (life sciences)  . Smokeless tobacco: Never Used  Substance Use Topics  . Alcohol use: Yes    Alcohol/week: 2.0 standard drinks  Types: 2 Glasses of wine per week  . Drug use: Not Currently     Medication list has been reviewed and updated.  Current Meds  Medication Sig  . amLODipine (NORVASC) 10 MG tablet Take 10 mg by mouth daily.  Marland Kitchen apixaban (ELIQUIS) 2.5 MG TABS tablet Take 1 tablet (2.5 mg  total) by mouth 2 (two) times daily.  . Ascorbic Acid (VITAMIN C) 1000 MG tablet Take 1,000 mg by mouth daily.  . cholecalciferol (VITAMIN D3) 25 MCG (1000 UT) tablet Take 1,000 Units by mouth daily.  . Flaxseed, Linseed, (FLAX SEED OIL) 1000 MG CAPS Take by mouth.  . furosemide (LASIX) 40 MG tablet Take 1 tablet (40 mg total) by mouth daily.  Marland Kitchen glipiZIDE (GLUCOTROL XL) 10 MG 24 hr tablet Take 10 mg by mouth 2 (two) times daily.   . insulin NPH Human (NOVOLIN N) 100 UNIT/ML injection Inject 5-10 Units into the skin daily. 5 units in the AM and 10 units in the PM- Sliding Scale  . losartan (COZAAR) 50 MG tablet Take 1 tablet (50 mg total) by mouth daily.  . Multiple Vitamins-Minerals (MULTIVITAMIN WITH MINERALS) tablet Take 1 tablet by mouth daily.  . nitroGLYCERIN (NITROLINGUAL) 0.4 MG/SPRAY spray Place 1 spray under the tongue as directed.  . Omega 3 1000 MG CAPS Take 1 capsule by mouth daily.  . rosuvastatin (CRESTOR) 5 MG tablet Take 1 tablet (5 mg total) by mouth 3 (three) times a week.  . silver sulfADIAZINE (SILVADENE) 1 % cream Apply 1 application topically 2 (two) times daily.    PHQ 2/9 Scores 04/07/2019 01/10/2019 11/08/2018 08/02/2018  PHQ - 2 Score 2 0 0 0  PHQ- 9 Score 4 - - 4    BP Readings from Last 3 Encounters:  04/07/19 128/78  03/22/19 (!) 160/80  01/10/19 128/78    Physical Exam Vitals and nursing note reviewed.  Constitutional:      General: He is not in acute distress.    Appearance: He is well-developed.  HENT:     Head: Normocephalic and atraumatic.  Cardiovascular:     Rate and Rhythm: Normal rate and regular rhythm.     Pulses: Normal pulses.  Pulmonary:     Effort: Pulmonary effort is normal. No respiratory distress.  Musculoskeletal:        General: Normal range of motion.     Cervical back: Normal range of motion.     Right lower leg: 2+ Edema present.     Left lower leg: 2+ Edema present.  Lymphadenopathy:     Cervical: No cervical adenopathy.    Skin:    General: Skin is warm and dry.     Findings: No rash.  Neurological:     General: No focal deficit present.     Mental Status: He is alert and oriented to person, place, and time.  Psychiatric:        Behavior: Behavior normal.        Thought Content: Thought content normal.     Wt Readings from Last 3 Encounters:  04/07/19 266 lb (120.7 kg)  03/22/19 257 lb 1.9 oz (116.6 kg)  01/10/19 253 lb (114.8 kg)    BP 128/78   Pulse (!) 58   Ht 5\' 8"  (1.727 m)   Wt 266 lb (120.7 kg)   SpO2 94%   BMI 40.45 kg/m   Assessment and Plan: 1. Type II diabetes mellitus with complication (HCC) Blood sugars are improved with the addition of bid  sliding scale Novolin N. He continues glipizide as well. Continue to monitor blood sugars at home - Hemoglobin A1c  2. Essential hypertension Clinically stable exam with well controlled BP on losartan, lasix and amlodipine. Tolerating medications without side effects at this time. Pt to continue current regimen and low sodium diet; benefits of regular exercise as able discussed.  3. Chronic kidney disease, stage 3a Will monitor, esp with the addition of lasix. He is on ARB daily - Basic metabolic panel  4. Persistent atrial fibrillation (HCC) Now on Eliquis dose appropriate for age/renal fxn No bleeding issues. Rate controlled - on no beta blocker or CCB To follow up with Cardiology as planned for further evaluation/intervention  5. Need for immunization against influenza Discussed and administered today - Flu Vaccine QUAD High Dose(Fluad)   Partially dictated using Editor, commissioning. Any errors are unintentional.  Halina Maidens, MD [facility]  04/07/2019

## 2019-04-11 ENCOUNTER — Other Ambulatory Visit: Payer: Self-pay

## 2019-04-11 MED ORDER — ACCU-CHEK AVIVA PLUS W/DEVICE KIT: 1.0000 | PACK | Freq: Two times a day (BID) | 0 refills | Status: DC

## 2019-04-14 ENCOUNTER — Ambulatory Visit: Payer: Medicare HMO | Admitting: Internal Medicine

## 2019-04-17 ENCOUNTER — Other Ambulatory Visit: Payer: Self-pay

## 2019-04-17 MED ORDER — ACCU-CHEK GUIDE VI STRP: ORAL_STRIP | 12 refills | Status: DC

## 2019-04-24 ENCOUNTER — Other Ambulatory Visit: Payer: Self-pay

## 2019-04-24 ENCOUNTER — Telehealth: Payer: Self-pay

## 2019-04-24 MED ORDER — APIXABAN 2.5 MG PO TABS: 2.5000 mg | ORAL_TABLET | Freq: Two times a day (BID) | ORAL | 1 refills | Status: DC

## 2019-04-24 NOTE — Telephone Encounter (Signed)
Patient woke up 2-3 days ago with swollen irritated left eye. Said now cannot see out of his eye. Blurry and only seeing blue, and gold colors.   Jenna Luo ( pt friend) [facility] number to call and schedule an appt for as soon as possible.  She verbalized understanding.

## 2019-04-26 ENCOUNTER — Other Ambulatory Visit: Payer: Self-pay

## 2019-04-26 ENCOUNTER — Encounter: Payer: Self-pay | Admitting: Internal Medicine

## 2019-04-26 ENCOUNTER — Other Ambulatory Visit
Admission: RE | Admit: 2019-04-26 | Discharge: 2019-04-26 | Disposition: A | Discharge status: Home / Self Care | Payer: Medicare HMO | Source: Ambulatory Visit | Attending: Internal Medicine | Admitting: Internal Medicine

## 2019-04-26 ENCOUNTER — Ambulatory Visit (INDEPENDENT_AMBULATORY_CARE_PROVIDER_SITE_OTHER): Payer: Medicare HMO | Admitting: Internal Medicine

## 2019-04-26 VITALS — BP 162/68 | HR 51 | Ht 68.0 in | Wt 276.0 lb

## 2019-04-26 DIAGNOSIS — R0602 Shortness of breath: Secondary | ICD-10-CM

## 2019-04-26 DIAGNOSIS — N184 Chronic kidney disease, stage 4 (severe): Secondary | ICD-10-CM

## 2019-04-26 DIAGNOSIS — I5032 Chronic diastolic (congestive) heart failure: Secondary | ICD-10-CM | POA: Insufficient documentation

## 2019-04-26 DIAGNOSIS — I4819 Other persistent atrial fibrillation: Secondary | ICD-10-CM

## 2019-04-26 DIAGNOSIS — I10 Essential (primary) hypertension: Secondary | ICD-10-CM

## 2019-04-26 LAB — BRAIN NATRIURETIC PEPTIDE: B Natriuretic Peptide: 169 pg/mL — ABNORMAL HIGH (ref 0.0–100.0)

## 2019-04-26 LAB — BASIC METABOLIC PANEL
Anion gap: 10 (ref 5–15)
BUN: 40 mg/dL — ABNORMAL HIGH (ref 8–23)
CO2: 26 mmol/L (ref 22–32)
Calcium: 9.1 mg/dL (ref 8.9–10.3)
Chloride: 104 mmol/L (ref 98–111)
Creatinine, Ser: 2.21 mg/dL — ABNORMAL HIGH (ref 0.61–1.24)
GFR calc Af Amer: 31 mL/min — ABNORMAL LOW (ref >60)
GFR calc non Af Amer: 27 mL/min — ABNORMAL LOW (ref >60)
Glucose, Bld: 206 mg/dL — ABNORMAL HIGH (ref 70–99)
Potassium: 4.2 mmol/L (ref 3.5–5.1)
Sodium: 140 mmol/L (ref 135–145)

## 2019-04-26 NOTE — Patient Instructions (Signed)
Medication Instructions:  Your physician recommends that you continue on your current medications as directed. Please refer to the Current Medication list given to you today.  *If you need a refill on your cardiac medications before your next appointment, please call your pharmacy*  Lab Work: Your physician recommends that you return for lab work in: TODAY at the [place]. - Please go to the [facility]. You will check in at the front desk to the right as you walk into the atrium. Valet Parking is offered if needed. - No appointment needed. You may go any day between 7 am and 6 pm.  If you have labs (blood work) drawn today and your tests are completely normal, you will receive your results only by: Marland Kitchen MyChart Message (if you have MyChart) OR . A paper copy in the mail If you have any lab test that is abnormal or we need to change your treatment, we will call you to review the results.  Testing/Procedures: Your physician has requested that you have an echocardiogram. Echocardiography is a painless test that uses sound waves to create images of your heart. It provides your doctor with information about the size and shape of your heart and how well your heart's chambers and valves are working. This procedure takes approximately one hour. There are no restrictions for this procedure. You may get an IV, if needed, to receive an ultrasound enhancing agent through to better visualize your heart.    Follow-Up: You have been referred to Pulmonology. Please call if you do not receive a call. Number listed on next page.    At [facility], you and your health needs are our priority.  As part of our continuing mission to provide you with exceptional heart care, we have created designated Provider Care Teams.  These Care Teams include your primary Cardiologist (physician) and Advanced Practice Providers (APPs -  Physician Assistants and Nurse Practitioners) who all work together to  provide you with the care you need, when you need it.  Your next appointment:   6 week(s)  The format for your next appointment:   In Person  Provider:    You may see DR Harrell Gave END or one of the following Advanced Practice Providers on your designated Care Team:    Murray Hodgkins, NP  Christell Faith, PA-C  Marrianne Mood, PA-C   Echocardiogram An echocardiogram is a procedure that uses painless sound waves (ultrasound) to produce an image of the heart. Images from an echocardiogram can provide important information about:  Signs of coronary artery disease (CAD).  Aneurysm detection. An aneurysm is a weak or damaged part of an artery wall that bulges out from the normal force of blood pumping through the body.  Heart size and shape. Changes in the size or shape of the heart can be associated with certain conditions, including heart failure, aneurysm, and CAD.  Heart muscle function.  Heart valve function.  Signs of a past heart attack.  Fluid buildup around the heart.  Thickening of the heart muscle.  A tumor or infectious growth around the heart valves. Tell a health care provider about:  Any allergies you have.  All medicines you are taking, including vitamins, herbs, eye drops, creams, and over-the-counter medicines.  Any blood disorders you have.  Any surgeries you have had.  Any medical conditions you have.  Whether you are pregnant or may be pregnant. What are the risks? Generally, this is a safe procedure. However, problems  may occur, including:  Allergic reaction to dye (contrast) that may be used during the procedure. What happens before the procedure? No specific preparation is needed. You may eat and drink normally. What happens during the procedure?   An IV tube may be inserted into one of your veins.  You may receive contrast through this tube. A contrast is an injection that improves the quality of the pictures from your heart.  A gel  will be applied to your chest.  A wand-like tool (transducer) will be moved over your chest. The gel will help to transmit the sound waves from the transducer.  The sound waves will harmlessly bounce off of your heart to allow the heart images to be captured in real-time motion. The images will be recorded on a computer. The procedure may vary among health care providers and hospitals. What happens after the procedure?  You may return to your normal, everyday life, including diet, activities, and medicines, unless your health care provider tells you not to do that. Summary  An echocardiogram is a procedure that uses painless sound waves (ultrasound) to produce an image of the heart.  Images from an echocardiogram can provide important information about the size and shape of your heart, heart muscle function, heart valve function, and fluid buildup around your heart.  You do not need to do anything to prepare before this procedure. You may eat and drink normally.  After the echocardiogram is completed, you may return to your normal, everyday life, unless your health care provider tells you not to do that. This information is not intended to replace advice given to you by your health care provider. Make sure you discuss any questions you have with your health care provider. Document Revised: 06/30/2018 Document Reviewed: 04/11/2016 Elsevier Patient Education  [place].

## 2019-04-26 NOTE — Progress Notes (Addendum)
Follow-up Outpatient Visit Date: 04/26/2019  Primary Care Provider: Glean Hess, MD [address] [place] [address]   Chief Complaint: Shortness of breath and leg swelling  HPI:  Kyle Roberson is a 79 y.o. male with history of coronary artery disease, persistent atrial fibrillation, hypertension, hyperlipidemia, type 2 diabetes mellitus, chronic kidney disease, GERD, and hyperparathyroidism, who presents for follow-up of leg swelling and coronary artery disease.  I met Kyle Roberson in late December for evaluation of leg edema.  He was noted to be in atrial fibrillation at that time.  We agreed to increase furosemide to 40 mg daily.  Kyle Roberson also agreed to begin apixaban 2.5 mg twice daily after our visit, based on age and renal function.  Today, Kyle Roberson and his Roberson raise multiple concerns.  Since I last saw him, he has not had any significant improvement in his leg edema despite escalation of furosemide.  His shortness of breath also seems a little bit worse, noting that it has progressed ever since his hospitalization for urosepsis last spring.  Creatinine has also increased slightly on most recent check by Kyle Roberson.  Kyle Roberson raises the possibility of nephrology consultation.  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  He still has significant photosensitivity to the left eye.  Kyle Roberson has not had any chest pain, palpitations, or lightheadedness.  He ambulates slowly, carrying a cane with him for support as necessary.  He has been trying to minimize his sodium intake but notes that he consumes 1/2 to 1 teaspoon of baking soda (sodium bicarbonate) every other day in order to help lower his creatinine.  Home blood pressures are typically  140-150/80-90.  --------------------------------------------------------------------------------------------------  Cardiovascular History & Procedures: Cardiovascular Problems:  Coronary artery disease  Atrial fibrillation  Risk Factors:  Known CAD, hypertension, hyperlipidemia, diabetes mellitus, male gender, obesity, and age > 85  Cath/PCI:  None available.  CV Surgery:  None  EP Procedures and Devices:  None  Non-Invasive Evaluation(s):  TTE (07/07/2018): Nromal LV size with LVEF 60-65%.  Unable to assess diastolic function.  Mildly reduced RV function with mild RV dilation and thickening.  Aortic sclerosis noted.  Unable to assess aortic regurgitation.  Recent CV Pertinent Labs: Lab Results  Component Value Date   CHOL 137 07/22/2018   HDL 44 07/22/2018   LDLCALC 73 07/22/2018   TRIG 116 07/22/2018   BNP 169.0 (H) 04/26/2019   K 4.2 04/26/2019   BUN 40 (H) 04/26/2019   BUN 42 (H) 03/22/2019   CREATININE 2.21 (H) 04/26/2019    Past medical and surgical history were reviewed and updated in EPIC.  Current Meds  Medication Sig   amLODipine (NORVASC) 10 MG tablet Take 10 mg by mouth daily.   apixaban (ELIQUIS) 2.5 MG TABS tablet Take 1 tablet (2.5 mg total) by mouth 2 (two) times daily.   Ascorbic Acid (VITAMIN C) 1000 MG tablet Take 1,000 mg by mouth daily.   cholecalciferol (VITAMIN D3) 25 MCG (1000 UT) tablet Take 1,000 Units by mouth daily.   Flaxseed, Linseed, (FLAX SEED OIL) 1000 MG CAPS Take by mouth.   furosemide (LASIX) 40 MG tablet Take 1 tablet (40 mg total) by mouth daily.   glipiZIDE (GLUCOTROL XL) 10 MG 24 hr tablet Take 10 mg by mouth 2 (two) times daily.    insulin  NPH Human (NOVOLIN N) 100 UNIT/ML injection Inject 5-10 Units into the skin daily. 5 units in the AM and 10 units in the PM- Sliding Scale   losartan (COZAAR) 50 MG tablet Take 1 tablet (50 mg total) by mouth daily.   Multiple Vitamins-Minerals (MULTIVITAMIN WITH  MINERALS) tablet Take 1 tablet by mouth daily.   nitroGLYCERIN (NITROLINGUAL) 0.4 MG/SPRAY spray Place 1 spray under the tongue as directed.   Omega 3 1000 MG CAPS Take 1 capsule by mouth daily.   rosuvastatin (CRESTOR) 5 MG tablet Take 1 tablet (5 mg total) by mouth 3 (three) times a week.   silver sulfADIAZINE (SILVADENE) 1 % cream Apply 1 application topically 2 (two) times daily.    Allergies: Atorvastatin and Simvastatin  Social History   Tobacco Use   Smoking status: Former Smoker   Smokeless tobacco: Never Used  Substance Use Topics   Alcohol use: Yes    Alcohol/week: 2.0 standard drinks    Types: 2 Glasses of wine per week   Drug use: Not Currently    Family History  Problem Relation Age of Onset   Pancreatic cancer Mother    CAD Father    Diabetes Brother     Review of Systems: A 12-system review of systems was performed and was negative except as noted in the HPI.  --------------------------------------------------------------------------------------------------  Physical Exam: BP (!) 162/68 (BP Location: Left Arm, Patient Position: Sitting, Cuff Size: Normal)    Pulse (!) 51    Ht 5' 8"  (1.727 m)    Wt 276 lb (125.2 kg)    SpO2 96%    BMI 41.97 kg/m   General: NAD.  Seated in a wheelchair. HEENT: Left eye with subconjunctival hemorrhage. Facemask in place. Neck: Supple without lymphadenopathy, thyromegaly.  JVP approximately 8 cm with positive HJR. Lungs: Normal work of breathing. Clear to auscultation bilaterally without wheezes or crackles. Heart: Bradycardic but regular without murmurs, rubs, or gallops.  Unable to assess PMI due to body habitus. Abd: Bowel sounds present.  Firm and mildly distended.  Nontender.  Unable to assess HSM due to body habitus. Ext: 2+ pitting edema in both calves. Skin: Warm and dry without rash.  I personally ambulated Kyle Roberson in the hallway with appropriate heart rate response up to the upper 70s.  EKG: Atrial  fibrillation with slow ventricular response (ventricular rate 58 bpm).  Low voltage QRS with poor R wave progression.  Lab Results  Component Value Date   WBC 8.0 03/22/2019   HGB 13.1 03/22/2019   HCT 40.6 03/22/2019   MCV 86 03/22/2019   PLT 233 03/22/2019    Lab Results  Component Value Date   NA 140 04/26/2019   K 4.2 04/26/2019   CL 104 04/26/2019   CO2 26 04/26/2019   BUN 40 (H) 04/26/2019   CREATININE 2.21 (H) 04/26/2019   GLUCOSE 206 (H) 04/26/2019   ALT 13 03/22/2019    Lab Results  Component Value Date   CHOL 137 07/22/2018   HDL 44 07/22/2018   LDLCALC 73 07/22/2018   TRIG 116 07/22/2018    --------------------------------------------------------------------------------------------------  ASSESSMENT AND PLAN: Chronic HFpEF with shortness of breath: Despite escalation of furosemide, Kyle Roberson continues to be significantly volume overloaded and has even put on 10 pounds in the last week and 20 pounds since 03/22/2019.  For now, we will plan to continue furosemide 40 mg daily and losartan 50 mg daily, though if his creatinine has worsened at all, I would favor discontinuing  losartan.  I will check a basic metabolic panel and BNP today.  We will also repeat an echocardiogram.  I stressed the importance of sodium restriction, including avoidance of sodium bicarbonate (baking soda).  Given that Mr. Zimmerle seems to wheeze at times, we will also refer him to pulmonology to evaluate for potential concurrent lung disease.  Persistent atrial fibrillation: Initial EKG shows atrial fibrillation with underlying junctional rhythm.  Heart rate response is appropriate with ambulation, with post ambulation EKG showing more typical atrial fibrillation albeit still with relatively slow ventricular response.  We will continue apixaban 2.5 mg twice daily.  I will reach out to Kyle Roberson regarding safety of continued apixaban use in the setting of his recent ophthalmologic condition as  well as if this could represent a cardioembolic event.  If it does, we may need to consider TEE before proceeding with cardioversion in the future to ensure that intracardiac thrombus is not present.  No indication for AV nodal blocking agents at this time given slow ventricular response at rest.  Hypertension: Blood pressure not well controlled today.  We will defer medication changes at this time, though as noted above, if creatinine has worsened further we will need to discontinue losartan.  I would favor adding hydralazine in its place, with escalation as tolerated.  Chronic kidney disease stage IV: Most recent creatinine up to 2.2 with GFR less than 30.  I will refer Kyle Roberson to nephrology for assistance with ongoing evaluation and management.  It might be worthwhile for [place] to pursue renal artery Doppler in the setting of poorly controlled hypertension and worsening renal function.  Morbid obesity: BMI greater than 40 with multiple comorbidities.  Weight loss encouraged through diet and exercise, though significant exertional dyspnea limits mobility.  Follow-up: Return to clinic in 6 weeks.  Approximately 50 minutes were spent on this encounter, of which more than 50% was spent Roberson-to-Roberson with the patient and his Roberson.  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  There is no contraindication to anticoagulation or DCCV (if needed in the future).  Nelva Bush, MD 04/27/2019 3:17 PM

## 2019-04-27 ENCOUNTER — Encounter: Payer: Self-pay | Admitting: Internal Medicine

## 2019-04-27 ENCOUNTER — Telehealth: Payer: Self-pay | Admitting: *Deleted

## 2019-04-27 DIAGNOSIS — I10 Essential (primary) hypertension: Secondary | ICD-10-CM

## 2019-04-27 DIAGNOSIS — I5033 Acute on chronic diastolic (congestive) heart failure: Secondary | ICD-10-CM | POA: Insufficient documentation

## 2019-04-27 DIAGNOSIS — N184 Chronic kidney disease, stage 4 (severe): Secondary | ICD-10-CM | POA: Insufficient documentation

## 2019-04-27 DIAGNOSIS — I5032 Chronic diastolic (congestive) heart failure: Secondary | ICD-10-CM | POA: Insufficient documentation

## 2019-04-27 MED ORDER — FUROSEMIDE 40 MG PO TABS: 40.0000 mg | ORAL_TABLET | Freq: Every day | ORAL | 3 refills | Status: DC

## 2019-04-27 MED ORDER — HYDRALAZINE HCL 25 MG PO TABS: 25.0000 mg | ORAL_TABLET | Freq: Three times a day (TID) | ORAL | 5 refills | Status: DC

## 2019-04-27 NOTE — Telephone Encounter (Signed)
Patient's daughter answered. She was present with patient at office visit the other day and handles patient's affairs. Advised next time when in the office to make sure patient signs the DPR. She verbalized understanding of the results and all recommendations. Med list updated and Rx's sent to pharmacy. Referral to [facility] placed and faxed to [phone] (office note, labs, and demographics). Daughter aware to call us back if she does not hear from them to schedule in 3-5 days. Renal doppler entered. Transferred her call to scheduler to go ahead and schedule this. She was very Patent attorney.

## 2019-04-27 NOTE — Telephone Encounter (Signed)
-----   Message from Nelva Bush, MD sent at 04/27/2019  7:19 AM EST ----- Please let Kyle Roberson know that his creatinine has increased slightly to 2.21 from 2.15.  I recommend that we stop losartan, decrease furosemide to 20 mg daily, and add hydralazine 25 mg every 8 hours.  If possible, please see if we can schedule [name] for a renal artery Doppler for evaluation of hypertension and renal insufficiency when he comes in for his echo.  Let's also refer him to nephrology.

## 2019-04-28 ENCOUNTER — Encounter: Payer: Self-pay | Admitting: Internal Medicine

## 2019-04-28 DIAGNOSIS — H348122 Central retinal vein occlusion, left eye, stable: Secondary | ICD-10-CM | POA: Insufficient documentation

## 2019-04-28 DIAGNOSIS — H5440 Blindness, one eye, unspecified eye: Secondary | ICD-10-CM | POA: Insufficient documentation

## 2019-05-04 NOTE — Telephone Encounter (Signed)
[facility] to make sure patient had appointment.  He is scheduled for 06/05/19 with Dr Holley Raring.

## 2019-05-05 ENCOUNTER — Telehealth: Payer: Self-pay | Admitting: Internal Medicine

## 2019-05-05 MED ORDER — HYDRALAZINE HCL 50 MG PO TABS: 50.0000 mg | ORAL_TABLET | Freq: Three times a day (TID) | ORAL | 2 refills | Status: DC

## 2019-05-05 MED ORDER — FUROSEMIDE 40 MG PO TABS: 40.0000 mg | ORAL_TABLET | Freq: Every day | ORAL | 2 refills | Status: DC

## 2019-05-05 NOTE — Telephone Encounter (Signed)
I think it is reasonable to increase hydralazine to 50 mg TID.  Pt should also watch his weight and edema closely and go back furosemide 40 mg daily.  Nelva Bush, MD [facility]

## 2019-05-05 NOTE — Telephone Encounter (Signed)
Spoke with patient's daughter. States patient's BP even at rest is running [id number] systolic. Last hs after sitting for 10 min it was 187/77. This morning at 8 am 167/72. She spot checks it throughout the day and it is always 160's or higher. Heart rate 70-80's. Patient is "short-winded" with exertion and going from room to room. He is wheezing at times. Nephrology appt is 3/16. Pulmonology appt is 3/25.  Daughter would like to see if Dr End would increase Hydralazine. Currently taking 25 mg every 8 hours. Advised I will check with him for further advice and let her know.

## 2019-05-05 NOTE — Telephone Encounter (Signed)
Pt c/o BP issue: STAT if pt c/o blurred vision, one-sided weakness or slurred speech  1. What are your last 5 BP readings?    Trending 0000000 systolic at rest   2. Are you having any other symptoms (ex. Dizziness, headache, blurred vision, passed out)?  No   3. What is your BP issue?  Elevated bp wants to know if hydralazine can be increased

## 2019-05-05 NOTE — Telephone Encounter (Signed)
Spoke with patient's daughter. She verbalized understanding to increase hydralazine to 50mg  three times a day, to increase furosemide to 40 mg and monitor sodium intake and swelling. Rx sent to pharmacy.

## 2019-05-07 ENCOUNTER — Encounter: Payer: Self-pay | Admitting: Emergency Medicine

## 2019-05-07 ENCOUNTER — Other Ambulatory Visit: Payer: Self-pay

## 2019-05-07 ENCOUNTER — Emergency Department: Payer: Medicare HMO

## 2019-05-07 ENCOUNTER — Inpatient Hospital Stay
Admission: EM | Admit: 2019-05-07 | Discharge: 2019-05-09 | DRG: 291 | Disposition: A | Discharge status: Home / Self Care | Payer: Medicare HMO | Attending: Internal Medicine | Admitting: Internal Medicine

## 2019-05-07 DIAGNOSIS — I509 Heart failure, unspecified: Secondary | ICD-10-CM

## 2019-05-07 DIAGNOSIS — Z20822 Contact with and (suspected) exposure to covid-19: Secondary | ICD-10-CM | POA: Diagnosis present

## 2019-05-07 DIAGNOSIS — Z888 Allergy status to other drugs, medicaments and biological substances status: Secondary | ICD-10-CM | POA: Diagnosis not present

## 2019-05-07 DIAGNOSIS — I130 Hypertensive heart and chronic kidney disease with heart failure and stage 1 through stage 4 chronic kidney disease, or unspecified chronic kidney disease: Secondary | ICD-10-CM | POA: Diagnosis not present

## 2019-05-07 DIAGNOSIS — Z955 Presence of coronary angioplasty implant and graft: Secondary | ICD-10-CM

## 2019-05-07 DIAGNOSIS — E785 Hyperlipidemia, unspecified: Secondary | ICD-10-CM | POA: Diagnosis present

## 2019-05-07 DIAGNOSIS — I10 Essential (primary) hypertension: Secondary | ICD-10-CM | POA: Diagnosis not present

## 2019-05-07 DIAGNOSIS — R0602 Shortness of breath: Secondary | ICD-10-CM | POA: Diagnosis not present

## 2019-05-07 DIAGNOSIS — E1165 Type 2 diabetes mellitus with hyperglycemia: Secondary | ICD-10-CM | POA: Diagnosis present

## 2019-05-07 DIAGNOSIS — Z87891 Personal history of nicotine dependence: Secondary | ICD-10-CM

## 2019-05-07 DIAGNOSIS — I5033 Acute on chronic diastolic (congestive) heart failure: Secondary | ICD-10-CM | POA: Diagnosis present

## 2019-05-07 DIAGNOSIS — E1169 Type 2 diabetes mellitus with other specified complication: Secondary | ICD-10-CM | POA: Diagnosis not present

## 2019-05-07 DIAGNOSIS — R600 Localized edema: Secondary | ICD-10-CM

## 2019-05-07 DIAGNOSIS — Z6841 Body Mass Index (BMI) 40.0 and over, adult: Secondary | ICD-10-CM

## 2019-05-07 DIAGNOSIS — I2510 Atherosclerotic heart disease of native coronary artery without angina pectoris: Secondary | ICD-10-CM | POA: Diagnosis not present

## 2019-05-07 DIAGNOSIS — E1122 Type 2 diabetes mellitus with diabetic chronic kidney disease: Secondary | ICD-10-CM | POA: Diagnosis present

## 2019-05-07 DIAGNOSIS — E118 Type 2 diabetes mellitus with unspecified complications: Secondary | ICD-10-CM | POA: Diagnosis present

## 2019-05-07 DIAGNOSIS — Z8249 Family history of ischemic heart disease and other diseases of the circulatory system: Secondary | ICD-10-CM

## 2019-05-07 DIAGNOSIS — I482 Chronic atrial fibrillation, unspecified: Secondary | ICD-10-CM | POA: Diagnosis present

## 2019-05-07 DIAGNOSIS — K219 Gastro-esophageal reflux disease without esophagitis: Secondary | ICD-10-CM | POA: Diagnosis not present

## 2019-05-07 DIAGNOSIS — R0600 Dyspnea, unspecified: Secondary | ICD-10-CM | POA: Diagnosis not present

## 2019-05-07 DIAGNOSIS — I4819 Other persistent atrial fibrillation: Secondary | ICD-10-CM | POA: Diagnosis not present

## 2019-05-07 DIAGNOSIS — R0609 Other forms of dyspnea: Secondary | ICD-10-CM

## 2019-05-07 DIAGNOSIS — N1831 Chronic kidney disease, stage 3a: Secondary | ICD-10-CM | POA: Diagnosis not present

## 2019-05-07 DIAGNOSIS — Z7901 Long term (current) use of anticoagulants: Secondary | ICD-10-CM | POA: Diagnosis not present

## 2019-05-07 DIAGNOSIS — I50813 Acute on chronic right heart failure: Secondary | ICD-10-CM

## 2019-05-07 DIAGNOSIS — Z794 Long term (current) use of insulin: Secondary | ICD-10-CM | POA: Diagnosis not present

## 2019-05-07 DIAGNOSIS — I351 Nonrheumatic aortic (valve) insufficiency: Secondary | ICD-10-CM | POA: Diagnosis not present

## 2019-05-07 DIAGNOSIS — I361 Nonrheumatic tricuspid (valve) insufficiency: Secondary | ICD-10-CM | POA: Diagnosis not present

## 2019-05-07 DIAGNOSIS — J810 Acute pulmonary edema: Secondary | ICD-10-CM

## 2019-05-07 DIAGNOSIS — R140 Abdominal distension (gaseous): Secondary | ICD-10-CM

## 2019-05-07 DIAGNOSIS — Z79899 Other long term (current) drug therapy: Secondary | ICD-10-CM | POA: Diagnosis not present

## 2019-05-07 DIAGNOSIS — I25118 Atherosclerotic heart disease of native coronary 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: Secondary | ICD-10-CM | POA: Diagnosis present

## 2019-05-07 DIAGNOSIS — I340 Nonrheumatic mitral (valve) insufficiency: Secondary | ICD-10-CM | POA: Diagnosis not present

## 2019-05-07 DIAGNOSIS — Z833 Family history of diabetes mellitus: Secondary | ICD-10-CM | POA: Diagnosis not present

## 2019-05-07 DIAGNOSIS — R1900 Intra-abdominal and pelvic swelling, mass and lump, unspecified site: Secondary | ICD-10-CM

## 2019-05-07 LAB — CBC WITH DIFFERENTIAL/PLATELET
Abs Immature Granulocytes: 0.03 10*3/uL (ref 0.00–0.07)
Basophils Absolute: 0 10*3/uL (ref 0.0–0.1)
Basophils Relative: 0 %
Eosinophils Absolute: 0.4 10*3/uL (ref 0.0–0.5)
Eosinophils Relative: 5 %
HCT: 39.2 % (ref 39.0–52.0)
Hemoglobin: 12.1 g/dL — ABNORMAL LOW (ref 13.0–17.0)
Immature Granulocytes: 0 %
Lymphocytes Relative: 16 %
Lymphs Abs: 1.4 10*3/uL (ref 0.7–4.0)
MCH: 28.1 pg (ref 26.0–34.0)
MCHC: 30.9 g/dL (ref 30.0–36.0)
MCV: 91 fL (ref 80.0–100.0)
Monocytes Absolute: 0.8 10*3/uL (ref 0.1–1.0)
Monocytes Relative: 9 %
Neutro Abs: 6.2 10*3/uL (ref 1.7–7.7)
Neutrophils Relative %: 70 %
Platelets: 235 10*3/uL (ref 150–400)
RBC: 4.31 MIL/uL (ref 4.22–5.81)
RDW: 15.1 % (ref 11.5–15.5)
WBC: 8.9 10*3/uL (ref 4.0–10.5)
nRBC: 0 % (ref 0.0–0.2)

## 2019-05-07 LAB — BASIC METABOLIC PANEL
Anion gap: 10 (ref 5–15)
BUN: 36 mg/dL — ABNORMAL HIGH (ref 8–23)
CO2: 23 mmol/L (ref 22–32)
Calcium: 9 mg/dL (ref 8.9–10.3)
Chloride: 104 mmol/L (ref 98–111)
Creatinine, Ser: 1.9 mg/dL — ABNORMAL HIGH (ref 0.61–1.24)
GFR calc Af Amer: 38 mL/min — ABNORMAL LOW (ref >60)
GFR calc non Af Amer: 33 mL/min — ABNORMAL LOW (ref >60)
Glucose, Bld: 189 mg/dL — ABNORMAL HIGH (ref 70–99)
Potassium: 4.1 mmol/L (ref 3.5–5.1)
Sodium: 137 mmol/L (ref 135–145)

## 2019-05-07 LAB — BRAIN NATRIURETIC PEPTIDE: B Natriuretic Peptide: 165 pg/mL — ABNORMAL HIGH (ref 0.0–100.0)

## 2019-05-07 LAB — PROCALCITONIN: Procalcitonin: <0.1 ng/mL

## 2019-05-07 LAB — POC SARS CORONAVIRUS 2 AG: SARS Coronavirus 2 Ag: NEGATIVE

## 2019-05-07 LAB — TROPONIN I (HIGH SENSITIVITY): Troponin I (High Sensitivity): 13 ng/L (ref <18)

## 2019-05-07 LAB — LACTIC ACID, PLASMA: Lactic Acid, Venous: 0.8 mmol/L (ref 0.5–1.9)

## 2019-05-07 MED ORDER — ACETAMINOPHEN 325 MG PO TABS: 650.0000 mg | ORAL_TABLET | ORAL | Status: DC | PRN

## 2019-05-07 MED ORDER — SODIUM CHLORIDE 0.9 % IV SOLN: 250.0000 mL | INTRAVENOUS | Status: DC | PRN

## 2019-05-07 MED ORDER — HYDRALAZINE HCL 50 MG PO TABS
50.0000 mg | ORAL_TABLET | Freq: Three times a day (TID) | ORAL | Status: DC
  Administered 2019-05-08 – 2019-05-09 (×6): 50 mg via ORAL
  Filled 2019-05-07 (×6): qty 1

## 2019-05-07 MED ORDER — FUROSEMIDE 10 MG/ML IJ SOLN
60.0000 mg | Freq: Two times a day (BID) | INTRAMUSCULAR | Status: DC
  Administered 2019-05-08 – 2019-05-09 (×4): 60 mg via INTRAVENOUS
  Filled 2019-05-07: qty 6
  Filled 2019-05-07: qty 8
  Filled 2019-05-07 (×2): qty 6

## 2019-05-07 MED ORDER — INSULIN ASPART 100 UNIT/ML ~~LOC~~ SOLN: 0.0000 [IU] | Freq: Every day | SUBCUTANEOUS | Status: DC

## 2019-05-07 MED ORDER — SODIUM CHLORIDE 0.9% FLUSH: 3.0000 mL | INTRAVENOUS | Status: DC | PRN

## 2019-05-07 MED ORDER — AMLODIPINE BESYLATE 10 MG PO TABS
10.0000 mg | ORAL_TABLET | Freq: Every day | ORAL | Status: DC
  Administered 2019-05-08 – 2019-05-09 (×2): 10 mg via ORAL
  Filled 2019-05-07 (×2): qty 1

## 2019-05-07 MED ORDER — ONDANSETRON HCL 4 MG/2ML IJ SOLN: 4.0000 mg | Freq: Four times a day (QID) | INTRAMUSCULAR | Status: DC | PRN

## 2019-05-07 MED ORDER — ADULT MULTIVITAMIN W/MINERALS CH
1.0000 | ORAL_TABLET | Freq: Every day | ORAL | Status: DC
  Administered 2019-05-08 – 2019-05-09 (×2): 1 via ORAL
  Filled 2019-05-07 (×2): qty 1

## 2019-05-07 MED ORDER — ENOXAPARIN SODIUM 40 MG/0.4ML ~~LOC~~ SOLN: 40.0000 mg | SUBCUTANEOUS | Status: DC

## 2019-05-07 MED ORDER — FUROSEMIDE 10 MG/ML IJ SOLN: 40.0000 mg | Freq: Once | INTRAMUSCULAR | Status: DC

## 2019-05-07 MED ORDER — ASCORBIC ACID 500 MG PO TABS
1000.0000 mg | ORAL_TABLET | Freq: Every day | ORAL | Status: DC
  Administered 2019-05-08 – 2019-05-09 (×2): 1000 mg via ORAL
  Filled 2019-05-07 (×2): qty 2

## 2019-05-07 MED ORDER — APIXABAN 2.5 MG PO TABS
2.5000 mg | ORAL_TABLET | Freq: Two times a day (BID) | ORAL | Status: DC
  Administered 2019-05-08 – 2019-05-09 (×4): 2.5 mg via ORAL
  Filled 2019-05-07 (×5): qty 1

## 2019-05-07 MED ORDER — VITAMIN D 25 MCG (1000 UNIT) PO TABS
2000.0000 [IU] | ORAL_TABLET | Freq: Every day | ORAL | Status: DC
  Administered 2019-05-08 – 2019-05-09 (×2): 2000 [IU] via ORAL
  Filled 2019-05-07 (×2): qty 2

## 2019-05-07 MED ORDER — INSULIN ASPART 100 UNIT/ML ~~LOC~~ SOLN
0.0000 [IU] | Freq: Three times a day (TID) | SUBCUTANEOUS | Status: DC
  Administered 2019-05-08: 3 [IU] via SUBCUTANEOUS
  Administered 2019-05-09: 11 [IU] via SUBCUTANEOUS
  Administered 2019-05-09: 3 [IU] via SUBCUTANEOUS
  Filled 2019-05-07 (×3): qty 1

## 2019-05-07 MED ORDER — NITROGLYCERIN 0.4 MG SL SUBL: 0.4000 mg | SUBLINGUAL_TABLET | SUBLINGUAL | Status: DC

## 2019-05-07 MED ORDER — OMEGA-3-ACID ETHYL ESTERS 1 G PO CAPS
1.0000 | ORAL_CAPSULE | Freq: Every day | ORAL | Status: DC
  Administered 2019-05-08 – 2019-05-09 (×2): 1 g via ORAL
  Filled 2019-05-07 (×3): qty 1

## 2019-05-07 MED ORDER — SODIUM CHLORIDE 0.9% FLUSH
3.0000 mL | Freq: Two times a day (BID) | INTRAVENOUS | Status: DC
  Administered 2019-05-08 – 2019-05-09 (×4): 3 mL via INTRAVENOUS

## 2019-05-07 MED ORDER — ROSUVASTATIN CALCIUM 5 MG PO TABS
5.0000 mg | ORAL_TABLET | ORAL | Status: DC
  Administered 2019-05-08: 5 mg via ORAL
  Filled 2019-05-07: qty 1

## 2019-05-07 MED ORDER — POLYVINYL ALCOHOL 1.4 % OP SOLN
1.0000 [drp] | Freq: Three times a day (TID) | OPHTHALMIC | Status: DC | PRN
  Filled 2019-05-07: qty 15

## 2019-05-07 NOTE — ED Provider Notes (Addendum)
[facility] Emergency Department Provider Note   ____________________________________________    I have reviewed the triage vital signs and the nursing notes.   HISTORY  Chief Complaint Shortness of Breath     HPI Kyle Roberson is a 79 y.o. male with history of diabetes, CAD, chronic kidney disease who presents with complaints of shortness of breath.  Patient reports he woke up this morning feeling markedly short of breath, this is new from yesterday.  He reports a dry cough.  Does not know if he has had fevers.  Denies fatigue or myalgias.  No loss of taste or smell.  No sick contacts reported.  Feels very short of breath with any exertion.  No chest pain.  Past Medical History:  Diagnosis Date  . AKI (acute kidney injury) ([place]) 07/06/2018  . CAD (coronary artery disease)   . Diabetes ([place])   . GERD (gastroesophageal reflux disease)   . HLD (hyperlipidemia)   . HTN (hypertension)     Patient Active Problem List   Diagnosis Date Noted  . Dyspnea on exertion 05/07/2019  . Suspect Acute heart failure ([place]) 05/07/2019  . 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 CKD (chronic kidney disease) stage 4, GFR 15-29 ml/min (HCC) 04/27/2019  . Chronic heart failure with preserved ejection fraction (HFpEF) ([place]) 04/27/2019  . Morbid obesity ([place]) 04/27/2019  . Lower extremity edema 03/23/2019  . Erectile dysfunction 08/01/2018  . History of colonic diverticulitis 08/01/2018  . 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 Obesity (BMI 30-39.9) 08/01/2018  . Venous insufficiency 08/01/2018  . Persistent atrial fibrillation ([place]) 08/01/2018  . 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 Essential hypertension 07/06/2018  . Type II diabetes mellitus with complication ([place]) [id number]  . Coronary artery disease involving native coronary artery of native heart without angina pectoris 06/05/2018  . 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[place]) 01/30/2017    . Vitamin D deficiency 01/30/2017  . Benign non-nodular prostatic hyperplasia with lower urinary tract symptoms 09/29/2014  . Chronic kidney disease, stage 3a 06/27/2014  . History of MI (myocardial infarction) 06/27/2014  . Nephrolithiasis 01/04/2012    Past Surgical History:  Procedure Laterality Date  . CORONARY STENT INTERVENTION    . CYSTOSCOPY    . HERNIA REPAIR      Prior to Admission medications   Medication Sig Start Date End Date Taking? Authorizing Provider  amLODipine (NORVASC) 10 MG tablet Take 10 mg by mouth daily. 06/01/18 06/01/19 Yes [provider]  apixaban (ELIQUIS) 2.5 MG TABS tablet Take 1 tablet (2.5 mg total) by mouth 2 (two) times daily. 04/24/19  Yes Glean Hess, MD  Ascorbic Acid (VITAMIN C) 1000 MG tablet Take 1,000 mg by mouth daily.   Yes [provider]  cholecalciferol (VITAMIN D3) 25 MCG (1000 UT) tablet Take 2,000 Units by mouth daily.    Yes [provider]  furosemide (LASIX) 40 MG tablet Take 1 tablet (40 mg total) by mouth daily. 05/05/19 08/03/19 Yes End, Harrell Gave, MD  glipiZIDE (GLUCOTROL XL) 10 MG 24 hr tablet Take 10 mg by mouth 2 (two) times daily.  06/01/18 06/01/19 Yes [provider]  hydrALAZINE (APRESOLINE) 50 MG tablet Take 1 tablet (50 mg total) by mouth 3 (three) times daily. 05/05/19 08/03/19 Yes End, Harrell Gave, MD  insulin NPH Human (NOVOLIN N) 100 UNIT/ML injection Inject 10-15 Units into the skin daily. Take 10-15 units twice a day after breakfast and before bedtime per Sliding Scale (BS>250: 15 units, BS= 200-250:10  units) 11/30/17  Yes [provider]  MAXITROL 3.5-10000-0.1 [place] 1 application into the left eye 2 (two) times daily.  05/03/19  Yes [provider]  Multiple Vitamins-Minerals (MULTIVITAMIN WITH MINERALS) tablet Take 1 tablet by mouth daily.   Yes [provider]  nitroGLYCERIN (NITROLINGUAL) 0.4 MG/SPRAY spray Place 1 spray under the tongue as  directed. 06/30/18 06/30/19 Yes [provider]  Omega 3 1000 MG CAPS Take 1 capsule by mouth daily. 04/14/06  Yes [provider]  rosuvastatin (CRESTOR) 5 MG tablet Take 1 tablet (5 mg total) by mouth 3 (three) times a week. 01/11/19  Yes Glean Hess, MD  silver sulfADIAZINE (SILVADENE) 1 % cream Apply 1 application topically 2 (two) times daily. 06/30/18 06/30/19 Yes [provider]  Flaxseed, Linseed, (FLAX SEED OIL) 1000 MG CAPS Take by mouth.    [provider]     Allergies Atorvastatin and Simvastatin  Family History  Problem Relation Age of Onset  . Pancreatic cancer Mother   . CAD Father   . Diabetes Brother     Social History Social History   Tobacco Use  . Smoking status: Former Research scientist (life sciences)  . Smokeless tobacco: Never Used  Substance Use Topics  . Alcohol use: Yes    Alcohol/week: 2.0 standard drinks    Types: 2 Glasses of wine per week  . Drug use: Not Currently    Review of Systems  Constitutional: No fever/chills Eyes: No visual changes.  ENT: As above Cardiovascular: Denies chest pain. Respiratory: As above Gastrointestinal: No abdominal pain.  Genitourinary: Negative for dysuria. Musculoskeletal: Negative for back pain. Skin: Negative for rash. Neurological: Negative for headaches or weakness   ____________________________________________   PHYSICAL EXAM:  VITAL SIGNS: ED Triage Vitals  Enc Vitals Group     BP 05/07/19 1725 (!) 161/65     Pulse Rate 05/07/19 1722 65     Resp 05/07/19 1722 18     Temp 05/07/19 1722 98.2 F (36.8 C)     Temp Source 05/07/19 1722 Oral     SpO2 05/07/19 1722 95 %     Weight 05/07/19 1722 124.7 kg (275 lb)     Height 05/07/19 1722 1.727 m (5\' 8" )     Head Circumference --      Peak Flow --      Pain Score 05/07/19 1722 0     Pain Loc --      Pain Edu? --      Excl. in [place]? --     Constitutional: Alert and oriented.  Nose: No congestion/rhinnorhea. Mouth/Throat: Mucous  membranes are moist.    Cardiovascular: Normal rate, regular rhythm. Grossly normal heart sounds.  Good peripheral circulation. Respiratory: Mildly increased respiratory effort with mild tachypnea on my exam.  No retractions.  Bibasilar Rales Gastrointestinal: Soft and nontender. No distention.    Musculoskeletal: No lower extremity tenderness nor edema.  Warm and well perfused Neurologic:  Normal speech and language. No gross focal neurologic deficits are appreciated.  Skin:  Skin is warm, dry and intact. No rash noted. Psychiatric: Mood and affect are normal. Speech and behavior are normal.  ____________________________________________   LABS (all labs ordered are listed, but only abnormal results are displayed)  Labs Reviewed  CBC WITH DIFFERENTIAL/PLATELET - Abnormal; Notable for the following components:      Result Value   Hemoglobin 12.1 (*)    All other components within normal limits  BASIC METABOLIC PANEL - Abnormal; Notable for the  following components:   Glucose, Bld 189 (*)    BUN 36 (*)    Creatinine, Ser 1.90 (*)    GFR calc non Af Amer 33 (*)    GFR calc Af Amer 38 (*)    All other components within normal limits  BRAIN NATRIURETIC PEPTIDE - Abnormal; Notable for the following components:   B Natriuretic Peptide 165.0 (*)    All other components within normal limits  CULTURE, BLOOD (ROUTINE X 2)  CULTURE, BLOOD (ROUTINE X 2)  RESPIRATORY PANEL BY RT PCR (FLU A&B, COVID)  LACTIC ACID, PLASMA  PROCALCITONIN  LACTIC ACID, PLASMA  BASIC METABOLIC PANEL  HEMOGLOBIN A1C  POC SARS CORONAVIRUS 2 AG -  ED  POC SARS CORONAVIRUS 2 AG  TROPONIN I (HIGH SENSITIVITY)  TROPONIN I (HIGH SENSITIVITY)   ____________________________________________  EKG  ED ECG REPORT I, Lavonia Drafts, the attending physician, personally viewed and interpreted this ECG.  Date: 05/07/2019  Rhythm: normal sinus rhythm QRS Axis: normal Intervals: Nonspecific changes ST/T Wave  abnormalities: Abnormal Narrative Interpretation: no evidence of acute ischemia  ____________________________________________  RADIOLOGY  Chest x-ray consistent with multifocal pneumonia ____________________________________________   PROCEDURES  Procedure(s) performed: No  Procedures   Critical Care performed: yes  CRITICAL CARE Performed by: Lavonia Drafts   Total critical care time: 30 minutes  Critical care time was exclusive of separately billable procedures and treating other patients.  Critical care was necessary to treat or prevent imminent or life-threatening deterioration.  Critical care was time spent personally by me on the following activities: development of treatment plan with patient and/or surrogate as well as nursing, discussions with consultants, evaluation of patient's response to treatment, examination of patient, obtaining history from patient or surrogate, ordering and performing treatments and interventions, ordering and review of laboratory studies, ordering and review of radiographic studies, pulse oximetry and re-evaluation of patient's condition.  ____________________________________________   INITIAL IMPRESSION / ASSESSMENT AND PLAN / ED COURSE  Pertinent labs & imaging results that were available during my care of the patient were reviewed by me and considered in my medical decision making (see chart for details).  Patient presents with shortness of breath began this morning, he is afebrile here, lab work is overall reassuring, does have a history of chronic kidney disease which explains his mildly elevated BUN and creatinine.  Chest x-ray is concerning for bilateral multifocal pneumonia versus pulmonary edema, possibility of  COVID-19 pneumonia.  We will obtain labs including procalcitonin, BNP, Covid rapid swab, blood cultures.  Rapid Covid is negative, procalcitonin is reassuring, lab work is otherwise unremarkable.  Covid PCR pending, suspect  this is pulmonary edema related to CHF, will treat with IV Lasix.  The patient will require admission as he becomes markedly short of breath with any ambulation    ____________________________________________   FINAL CLINICAL IMPRESSION(S) / ED DIAGNOSES  Final diagnoses:  Acute pulmonary edema ([place])        Note:  This document was prepared using Dragon voice recognition software and may include unintentional dictation errors.   Lavonia Drafts, MD 05/07/19 2207    Lavonia Drafts, MD 05/31/19 [phone]

## 2019-05-07 NOTE — H&P (Signed)
History and Physical    Kyle Roberson [id number] DOB: 1940/08/23 DOA: 05/07/2019  PCP: Glean Hess, MD   Patient coming from: home  I have personally briefly reviewed patient's old medical records in [place]  Chief Complaint: shortness of breath  HPI: [name] is a 79 y.o. male with medical history significant for diabetes, hypertension, CAD, chronic atrial fibrillation on Eliquis recent past history of complaints of persistent lower extremity edema and dyspnea on exertion, seen by cardiologist, Dr. Saunders Revel on 04/26/2019, with recent complaints as well of elevated blood pressure at home and recent up titration of his hydralazine and furosemide on  who presents to the emergency room with worsening dyspnea on exertion on awaking in the a.m.  As well as orthopnea.  He denies chest pain, nausea vomiting palpitations, lightheadedness, diaphoresis.  Patient does not have a history of congestive heart failure and last echocardiogram in April 2020 showed EF 65%.  He denies cough, fever or chills or exposure to anyone with Covid.  ED Course: On arrival in the emergency room he was afebrile at 98.2, blood pressure 161/65, HR 65 with O2 sat 95% on room air decreasing to 91 to 92% with exertion.  BNP was elevated at 165.  Troponin still pending, EKG showed no acute ST-T wave changes.  Fattening was 1.9 which is his baseline for his CKD.  White cell count was 8900 and hemoglobin 12.1.  Covid antigen test was negative.  Chest x-ray showed patchy bilateral airspace opacities, right greater than left, pulmonary edema versus multifocal pneumonia.  Patient was treated with IV Lasix in the emergency room.  Hospitalist consulted for admission  Review of Systems: As per HPI otherwise 10 point review of systems negative.    Past Medical History:  Diagnosis Date  . AKI (acute kidney injury) ([place]) 07/06/2018  . CAD (coronary artery disease)   . Diabetes ([place])   . GERD (gastroesophageal  reflux disease)   . HLD (hyperlipidemia)   . HTN (hypertension)     Past Surgical History:  Procedure Laterality Date  . CORONARY STENT INTERVENTION    . CYSTOSCOPY    . HERNIA REPAIR       reports that he has quit smoking. He has never used smokeless tobacco. He reports current alcohol use of about 2.0 standard drinks of alcohol per week. He reports previous drug use.  Allergies  Allergen Reactions  . Atorvastatin Other (See Comments)  . Simvastatin Other (See Comments)    Family History  Problem Relation Age of Onset  . Pancreatic cancer Mother   . CAD Father   . Diabetes Brother      Prior to Admission medications   Medication Sig Start Date End Date Taking? Authorizing Provider  amLODipine (NORVASC) 10 MG tablet Take 10 mg by mouth daily. 06/01/18 06/01/19  [provider]  apixaban (ELIQUIS) 2.5 MG TABS tablet Take 1 tablet (2.5 mg total) by mouth 2 (two) times daily. 04/24/19   Glean Hess, MD  Ascorbic Acid (VITAMIN C) 1000 MG tablet Take 1,000 mg by mouth daily.    [provider]  cholecalciferol (VITAMIN D3) 25 MCG (1000 UT) tablet Take 1,000 Units by mouth daily.    [provider]  Flaxseed, Linseed, (FLAX SEED OIL) 1000 MG CAPS Take by mouth.    [provider]  furosemide (LASIX) 40 MG tablet Take 1 tablet (40 mg total) by mouth daily. 05/05/19 08/03/19  End, Harrell Gave, MD  glipiZIDE (GLUCOTROL XL) 10 MG  24 hr tablet Take 10 mg by mouth 2 (two) times daily.  06/01/18 06/01/19  [provider]  hydrALAZINE (APRESOLINE) 50 MG tablet Take 1 tablet (50 mg total) by mouth 3 (three) times daily. 05/05/19 08/03/19  End, Harrell Gave, MD  insulin NPH Human (NOVOLIN N) 100 UNIT/ML injection Inject 5-10 Units into the skin daily. 5 units in the AM and 10 units in the PM- Sliding Scale 11/30/17   [provider]  Multiple Vitamins-Minerals (MULTIVITAMIN WITH MINERALS) tablet Take 1 tablet by mouth daily.    [provider]  nitroGLYCERIN (NITROLINGUAL) 0.4 MG/SPRAY spray Place 1 spray under the tongue as directed. 06/30/18 06/30/19  [provider]  Omega 3 1000 MG CAPS Take 1 capsule by mouth daily. 04/14/06   [provider]  rosuvastatin (CRESTOR) 5 MG tablet Take 1 tablet (5 mg total) by mouth 3 (three) times a week. 01/11/19   Glean Hess, MD  silver sulfADIAZINE (SILVADENE) 1 % cream Apply 1 application topically 2 (two) times daily. 06/30/18 06/30/19  [provider]    Physical Exam: Vitals:   05/07/19 1722 05/07/19 1725 05/07/19 1902 05/07/19 2024  BP:  (!) 161/65 (!) 168/76 (!) 158/76  Pulse: 65  (!) 58 65  Resp: 18  18 (!) 24  Temp: 98.2 F (36.8 C)     TempSrc: Oral     SpO2: 95%  98% 95%  Weight: 124.7 kg     Height: 5\' 8"  (1.727 m)        Vitals:   05/07/19 1722 05/07/19 1725 05/07/19 1902 05/07/19 2024  BP:  (!) 161/65 (!) 168/76 (!) 158/76  Pulse: 65  (!) 58 65  Resp: 18  18 (!) 24  Temp: 98.2 F (36.8 C)     TempSrc: Oral     SpO2: 95%  98% 95%  Weight: 124.7 kg     Height: 5\' 8"  (1.727 m)       Constitutional: NAD, alert and oriented x 3 Eyes: PERRL,eventration left lower eyelid with increased lacrimation and conjunctival injection ENMT: Mucous membranes are moist.  Neck: normal, supple, no masses, no thyromegaly Respiratory: clear to auscultation bilaterally, no wheezing, no crackles. Conversational dyspnea Cardiovascular: Regular rate and rhythm, no murmurs / rubs / gallops. 2+ lower extremity edema. 2+ pedal pulses. No carotid bruits.  Abdomen: no tenderness, no masses palpated. No hepatosplenomegaly. Bowel sounds positive.  Musculoskeletal: no clubbing / cyanosis. No joint deformity upper and lower extremities.  Skin: chronic venous stasis changes bilateral lower extremities  Neurologic: No gross focal neurologic deficit. Psychiatric: Normal mood and affect.   Labs on Admission: I have personally reviewed following labs and  imaging studies  CBC: Recent Labs  Lab 05/07/19 1731  WBC 8.9  NEUTROABS 6.2  HGB 12.1*  HCT 39.2  MCV 91.0  PLT [id number]   Basic Metabolic Panel: Recent Labs  Lab 05/07/19 1731  NA 137  K 4.1  CL 104  CO2 23  GLUCOSE 189*  BUN 36*  CREATININE 1.90*  CALCIUM 9.0   GFR: Estimated Creatinine Clearance: 39.9 mL/min (A) (by C-G formula based on SCr of 1.9 mg/dL (H)). Liver Function Tests: No results for input(s): AST, ALT, ALKPHOS, BILITOT, PROT, ALBUMIN in the last 168 hours. No results for input(s): LIPASE, AMYLASE in the last 168 hours. No results for input(s): AMMONIA in the last 168 hours. Coagulation Profile: No results for input(s): INR, PROTIME in the last 168 hours. Cardiac Enzymes: No results for input(s): CKTOTAL,  CKMB, CKMBINDEX, TROPONINI in the last 168 hours. BNP (last 3 results) No results for input(s): PROBNP in the last 8760 hours. HbA1C: No results for input(s): HGBA1C in the last 72 hours. CBG: No results for input(s): GLUCAP in the last 168 hours. Lipid Profile: No results for input(s): CHOL, HDL, LDLCALC, TRIG, CHOLHDL, LDLDIRECT in the last 72 hours. Thyroid Function Tests: No results for input(s): TSH, T4TOTAL, FREET4, T3FREE, THYROIDAB in the last 72 hours. Anemia Panel: No results for input(s): VITAMINB12, FOLATE, FERRITIN, TIBC, IRON, RETICCTPCT in the last 72 hours. Urine analysis:    Component Value Date/Time   COLORURINE YELLOW (A) 01/02/2019 2131   APPEARANCEUR CLOUDY (A) 01/02/2019 2131   LABSPEC 1.013 01/02/2019 2131   PHURINE 5.0 01/02/2019 2131   GLUCOSEU >=500 (A) 01/02/2019 2131   HGBUR MODERATE (A) 01/02/2019 2131   BILIRUBINUR neg 01/10/2019 [place] 01/02/2019 2131   PROTEINUR Positive (A) 01/10/2019 1628   PROTEINUR 30 (A) 01/02/2019 2131   UROBILINOGEN 0.2 01/10/2019 1628   NITRITE neg 01/10/2019 1628   NITRITE NEGATIVE 01/02/2019 2131   LEUKOCYTESUR Negative 01/10/2019 1628   LEUKOCYTESUR NEGATIVE  01/02/2019 2131    Radiological Exams on Admission: DG Chest 2 View  Result Date: 05/07/2019 CLINICAL DATA:  Shortness of breath, worsening today. EXAM: CHEST - 2 VIEW COMPARISON:  Chest x-ray dated 08/02/2018. FINDINGS: Mild cardiomegaly is stable. Patchy airspace opacities bilaterally, RIGHT slightly greater than LEFT. Again noted is blunting of the bilateral costophrenic angles, indicating small bilateral pleural effusions and/or chronic pleural thickening. No pneumothorax seen. Osseous structures about the chest are unremarkable. IMPRESSION: Patchy bilateral airspace opacities , RIGHT greater than LEFT, pulmonary edema versus multifocal pneumonia. Electronically Signed   By: Franki Cabot M.D.   On: 05/07/2019 17:55    EKG: Independently reviewed.  No acute ST-T wave changes  Assessment/Plan Principal Problem:  Dyspnea on exertion -Suspect related to congestive heart failure with pulmonary edema but differential does include pneumonia based on chest x-ray, -Treat suspected heart failure as outlined below -Follow-up Covid PCR.  Patient has no cough or fever chest x-ray just possibility of multifocal pneumonia    Suspect Acute heart failure ([place]) -Continue to cycle enzymes to evaluate for ACS -Possibly related to hypertensive emergency given recent need for medication adjustments due to elevated home blood pressure readings -Echocardiogram in the a.m. -Daily weights, monitor intake and output, salt and fluid restriction -No ACE/ARB for now because of renal impairment -No beta-blocker for now based on acute component  Active Problems:     Essential hypertension -Continue home hydralazine 50 mg 3 times daily and amlodipine 10 daily    Type II diabetes mellitus with complication hyperlipidemia (HCC) -Hold home glipizide and NPH -Insulin sliding scale coverage    Chronic kidney disease, stage 3a -Renal function at baseline. -Monitor for worsening in view of IV diuretic  therapy  Coronary artery disease involving native coronary artery of native heart without angina pectoris -No complaints of chest pain -Continue to cycle troponins to evaluate for ACS as possible trigger for acute heart failure _Continue Crestor.  Currently ton beta-blocker for uncertain reason.  Not currently on antiplatelet, for uncertain reason possibly to decrease bleeding risk as he is on apixaban  Chronic atrial fibrillation (HCC) -Continue apixaban renally adjusted to 2.5 twice daily    Morbid obesity ([place]) -This complicates overall prognosis and care    DVT prophylaxis: on apixaban  Code Status: full code  Family Communication: none  Disposition Plan: Back  to previous home environment Consults called: none     Athena Masse MD [facility]     05/07/2019, 10:03 PM

## 2019-05-07 NOTE — ED Notes (Signed)
Pt ambulated to the restroom without assistance

## 2019-05-07 NOTE — ED Notes (Signed)
Pt was ambulated around the room with pulse oximetry and O2 monitoring. Before walking,  pts O2 was 98-100% while resting. While ambulating (standy by assistance required) pts O2 dropped to 92-91%. After completing the assessment, pt needed to sit on the side of the bed for 8-10 minutes before catching his breath to lay back in the bed. During this time pt was unable to speak In complete sentences without taking breaths in between. Accessory muscle use noted.

## 2019-05-07 NOTE — ED Triage Notes (Signed)
Pt to ED via POV c/o shortness of breath. Pt states that this started today and has gotten worse throughout the day. Pt states that if he tries to do anything he gets out of breath. Pt denies pain and is in NAD.

## 2019-05-07 NOTE — ED Notes (Signed)
Pts wife was called and notified of pts pending admission./ Wife had a concern that pt may have had a PE due to the rapid onset of SHOB and increased fluid retention despite medication increases. Wife also relayed to this RN the concern for the pts CBG results/ increased HTN and fluid retention. Per wife pt was started on Eliquis 3-4 weeks ago after having a "stroke in the LT eye (vessel burst)"  Per wife pt is scheduled for a ECHO due to a moderate amount of calcification noted in pts heart & doppler image of the renal artery on March 9 as told by DR. END.

## 2019-05-08 ENCOUNTER — Inpatient Hospital Stay: Payer: Medicare HMO

## 2019-05-08 ENCOUNTER — Inpatient Hospital Stay (HOSPITAL_COMMUNITY)
Admit: 2019-05-08 | Discharge: 2019-05-08 | Disposition: A | Discharge status: Home / Self Care | Payer: Medicare HMO | Attending: Internal Medicine | Admitting: Internal Medicine

## 2019-05-08 DIAGNOSIS — E1169 Type 2 diabetes mellitus with other specified complication: Secondary | ICD-10-CM

## 2019-05-08 DIAGNOSIS — I361 Nonrheumatic tricuspid (valve) insufficiency: Secondary | ICD-10-CM

## 2019-05-08 DIAGNOSIS — R140 Abdominal distension (gaseous): Secondary | ICD-10-CM

## 2019-05-08 DIAGNOSIS — I2510 Atherosclerotic heart disease of native coronary artery without angina pectoris: Secondary | ICD-10-CM

## 2019-05-08 DIAGNOSIS — I4819 Other persistent atrial fibrillation: Secondary | ICD-10-CM

## 2019-05-08 DIAGNOSIS — N1831 Chronic kidney disease, stage 3a: Secondary | ICD-10-CM

## 2019-05-08 DIAGNOSIS — I340 Nonrheumatic mitral (valve) insufficiency: Secondary | ICD-10-CM

## 2019-05-08 DIAGNOSIS — J810 Acute pulmonary edema: Secondary | ICD-10-CM

## 2019-05-08 DIAGNOSIS — R1900 Intra-abdominal and pelvic swelling, mass and lump, unspecified site: Secondary | ICD-10-CM

## 2019-05-08 DIAGNOSIS — E785 Hyperlipidemia, unspecified: Secondary | ICD-10-CM

## 2019-05-08 DIAGNOSIS — E118 Type 2 diabetes mellitus with unspecified complications: Secondary | ICD-10-CM

## 2019-05-08 DIAGNOSIS — I10 Essential (primary) hypertension: Secondary | ICD-10-CM

## 2019-05-08 DIAGNOSIS — I351 Nonrheumatic aortic (valve) insufficiency: Secondary | ICD-10-CM

## 2019-05-08 DIAGNOSIS — R0600 Dyspnea, unspecified: Secondary | ICD-10-CM

## 2019-05-08 DIAGNOSIS — K219 Gastro-esophageal reflux disease without esophagitis: Secondary | ICD-10-CM

## 2019-05-08 LAB — GLUCOSE, CAPILLARY
Glucose-Capillary: 101 mg/dL — ABNORMAL HIGH (ref 70–99)
Glucose-Capillary: 116 mg/dL — ABNORMAL HIGH (ref 70–99)
Glucose-Capillary: 129 mg/dL — ABNORMAL HIGH (ref 70–99)
Glucose-Capillary: 135 mg/dL — ABNORMAL HIGH (ref 70–99)
Glucose-Capillary: 144 mg/dL — ABNORMAL HIGH (ref 70–99)
Glucose-Capillary: 85 mg/dL (ref 70–99)

## 2019-05-08 LAB — BASIC METABOLIC PANEL
Anion gap: 8 (ref 5–15)
BUN: 33 mg/dL — ABNORMAL HIGH (ref 8–23)
CO2: 26 mmol/L (ref 22–32)
Calcium: 8.9 mg/dL (ref 8.9–10.3)
Chloride: 105 mmol/L (ref 98–111)
Creatinine, Ser: 1.84 mg/dL — ABNORMAL HIGH (ref 0.61–1.24)
GFR calc Af Amer: 39 mL/min — ABNORMAL LOW (ref >60)
GFR calc non Af Amer: 34 mL/min — ABNORMAL LOW (ref >60)
Glucose, Bld: 144 mg/dL — ABNORMAL HIGH (ref 70–99)
Potassium: 3.7 mmol/L (ref 3.5–5.1)
Sodium: 139 mmol/L (ref 135–145)

## 2019-05-08 LAB — ECHOCARDIOGRAM COMPLETE
Height: 68 in
Weight: 4321.6 oz

## 2019-05-08 LAB — RESPIRATORY PANEL BY RT PCR (FLU A&B, COVID)
Influenza A by PCR: NEGATIVE
Influenza B by PCR: NEGATIVE
SARS Coronavirus 2 by RT PCR: NEGATIVE

## 2019-05-08 LAB — HEMOGLOBIN A1C
Hgb A1c MFr Bld: 8.7 % — ABNORMAL HIGH (ref 4.8–5.6)
Mean Plasma Glucose: 202.99 mg/dL

## 2019-05-08 LAB — TROPONIN I (HIGH SENSITIVITY)
Troponin I (High Sensitivity): 12 ng/L (ref <18)
Troponin I (High Sensitivity): 13 ng/L (ref <18)

## 2019-05-08 MED ORDER — PNEUMOCOCCAL VAC POLYVALENT 25 MCG/0.5ML IJ INJ: 0.5000 mL | INJECTION | INTRAMUSCULAR | Status: DC

## 2019-05-08 NOTE — Progress Notes (Signed)
Not able to get a ReDs reading due to pt BMi being 41.07

## 2019-05-08 NOTE — Plan of Care (Signed)
  Problem: Education: Goal: Knowledge of General Education information will improve Description: Including pain rating scale, medication(s)/side effects and non-pharmacologic comfort measures Outcome: Progressing   Problem: Clinical Measurements: Goal: Cardiovascular complication will be avoided Outcome: Progressing   Problem: Safety: Goal: Ability to remain free from injury will improve Outcome: Progressing   Problem: Skin Integrity: Goal: Risk for impaired skin integrity will decrease Outcome: Progressing   Problem: Activity: Goal: Capacity to carry out activities will improve Outcome: Progressing

## 2019-05-08 NOTE — Progress Notes (Signed)
PROGRESS NOTE    Kyle Roberson  [id number] DOB: 1940-09-11 DOA: 05/07/2019 PCP: Glean Hess, MD   Brief Narrative:  Kyle Roberson is a 79 y.o. male with medical history significant for diabetes, hypertension, CAD, chronic atrial fibrillation on Eliquis recent past history of complaints of persistent lower extremity edema and dyspnea on exertion, seen by cardiologist, Dr. Saunders Revel on 04/26/2019, with recent complaints as well of elevated blood pressure at home and recent up titration of his hydralazine and furosemide on  who presents to the emergency room with worsening dyspnea on exertion on awaking in the a.m.  As well as orthopnea.  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  Subjective: Patient was still feeling short of breath when seen this morning.  No chest pain.  Assessment & Plan:   Principal Problem:   Suspect Acute heart failure (HCC) Active Problems:   Essential hypertension   Type II diabetes mellitus with complication (HCC)   Chronic kidney disease, stage 3a   Coronary artery disease involving native coronary artery of native heart without angina pectoris   Persistent atrial fibrillation (HCC)   Morbid obesity (HCC)   Dyspnea on exertion  Acute on chronic HFpEF.  According to prior echo done in in April 2020 he has normal ejection fraction but diastolic function was not evaluated.  His symptoms are more concerning for acute on chronic heart failure.  BNP is not very impressive.  Patient has positive JVD along with lower extremity edema.  Troponin remain negative.  Marked lower extremity edema involving the whole limb and distended abdomen.  Chest x-ray with possible pulmonary edema versus multifocal pneumonia.  COVID-19 was negative and no sign of any respiratory illness. -Echocardiogram done-waiting for results. -Continue Lasix 60 mg twice daily. -Continue strict intake and output and daily weight. -Continue monitoring BMP. -We will check  abdominal ultrasound to rule out any ascites.  Essential hypertension.  Blood pressure mildly elevated. -Continue home dose of amlodipine and hydralazine.  Type II diabetes mellitus with complication hyperlipidemia ([place]).  Uncontrolled with A1c of 8.7, although improved from prior check of 10.1 one month ago.  CBG within goal. -Continue sliding scale.  Chronic kidney disease, stage 3a.  Renal function stable.  Mild improvement in BUN and creatinine. -Continue to monitor renal function with diuresis.  Coronary artery disease involving native coronary artery of native heart without angina pectoris.  No chest pain.  Troponin remain negative. Patient is not on any beta-blocker. -Continue Crestor. -Not on any antiplatelet most likely due to Eliquis.  Chronic atrial fibrillation.  Currently rate controlled. -Continue home dose of Eliquis.  Morbid obesity ([place]) This complicates overall prognosis and care  Objective: Vitals:   05/08/19 0428 05/08/19 0450 05/08/19 0824 05/08/19 1142  BP:  (!) 147/75 (!) 157/75 (!) 161/66  Pulse:  65 63 65  Resp:  20 16 17   Temp:  97.7 F (36.5 C) (!) 97.4 F (36.3 C) (!) 97.4 F (36.3 C)  TempSrc:  Oral Oral Oral  SpO2:  94% 93% 94%  Weight: 122.5 kg     Height: 5\' 8"  (1.727 m)       Intake/Output Summary (Last 24 hours) at 05/08/2019 1325 Last data filed at 05/08/2019 1231 Gross per 24 hour  Intake --  Output 1250 ml  Net -1250 ml   Filed Weights   05/07/19 1722 05/08/19 0428  Weight: 124.7 kg 122.5 kg    Examination:  General exam: Morbidly obese gentleman, appears calm and comfortable  Respiratory  system: Clear to auscultation.  Mildly increased work of breathing. Cardiovascular system: S1 & S2 heard, RRR. + JVD,  Gastrointestinal system: Soft, nontender, distended, bowel sounds positive. Central nervous system: Alert and oriented. No focal neurological deficits.Symmetric 5 x 5 power. Extremities: 2+ LE edema, no cyanosis, pulses  intact and symmetrical. Skin: No rashes, lesions or ulcers Psychiatry: Judgement and insight appear normal. Mood & affect appropriate.    DVT prophylaxis: Eliquis Code Status: Full Family Communication: No family at bedside Disposition Plan: Pending improvement.  Most likely will go back home.  Consultants:   None  Procedures:  Antimicrobials:   Data Reviewed: I have personally reviewed following labs and imaging studies  CBC: Recent Labs  Lab 05/07/19 1731  WBC 8.9  NEUTROABS 6.2  HGB 12.1*  HCT 39.2  MCV 91.0  PLT [id number]   Basic Metabolic Panel: Recent Labs  Lab 05/07/19 1731 05/08/19 0517  NA 137 139  K 4.1 3.7  CL 104 105  CO2 23 26  GLUCOSE 189* 144*  BUN 36* 33*  CREATININE 1.90* 1.84*  CALCIUM 9.0 8.9   GFR: Estimated Creatinine Clearance: 40.8 mL/min (A) (by C-G formula based on SCr of 1.84 mg/dL (H)). Liver Function Tests: No results for input(s): AST, ALT, ALKPHOS, BILITOT, PROT, ALBUMIN in the last 168 hours. No results for input(s): LIPASE, AMYLASE in the last 168 hours. No results for input(s): AMMONIA in the last 168 hours. Coagulation Profile: No results for input(s): INR, PROTIME in the last 168 hours. Cardiac Enzymes: No results for input(s): CKTOTAL, CKMB, CKMBINDEX, TROPONINI in the last 168 hours. BNP (last 3 results) No results for input(s): PROBNP in the last 8760 hours. HbA1C: Recent Labs    05/08/19 0051  HGBA1C 8.7*   CBG: Recent Labs  Lab 05/08/19 0025 05/08/19 0424 05/08/19 0832 05/08/19 1144  GLUCAP 85 144* 101* 116*   Lipid Profile: No results for input(s): CHOL, HDL, LDLCALC, TRIG, CHOLHDL, LDLDIRECT in the last 72 hours. Thyroid Function Tests: No results for input(s): TSH, T4TOTAL, FREET4, T3FREE, THYROIDAB in the last 72 hours. Anemia Panel: No results for input(s): VITAMINB12, FOLATE, FERRITIN, TIBC, IRON, RETICCTPCT in the last 72 hours. Sepsis Labs: Recent Labs  Lab 05/07/19 1731 05/07/19 1845    PROCALCITON <0.10  --   LATICACIDVEN  --  0.8    Recent Results (from the past 240 hour(s))  Blood Culture (routine x 2)     Status: None (Preliminary result)   Collection Time: 05/07/19  6:45 PM   Specimen: BLOOD  Result Value Ref Range Status   Specimen Description BLOOD R AC  Final   Special Requests   Final    BOTTLES DRAWN AEROBIC AND ANAEROBIC Blood Culture results may not be optimal due to an excessive volume of blood received in culture bottles   Culture   Final    NO GROWTH < 24 HOURS Performed at [facility], [address], [place], [place] [address]    Report Status PENDING  Incomplete  Blood Culture (routine x 2)     Status: None (Preliminary result)   Collection Time: 05/07/19  6:45 PM   Specimen: BLOOD  Result Value Ref Range Status   Specimen Description BLOOD R HAND  Final   Special Requests   Final    BOTTLES DRAWN AEROBIC AND ANAEROBIC Blood Culture results may not be optimal due to an inadequate volume of blood received in culture bottles   Culture   Final    NO GROWTH <  24 HOURS Performed at [facility], [address] [place], [place] [address]    Report Status PENDING  Incomplete  Respiratory Panel by RT PCR (Flu A&B, Covid) - Nasopharyngeal Swab     Status: None   Collection Time: 05/07/19 11:58 PM   Specimen: Nasopharyngeal Swab  Result Value Ref Range Status   SARS Coronavirus 2 by RT PCR NEGATIVE NEGATIVE Final    Comment: (NOTE) SARS-CoV-2 target nucleic acids are NOT DETECTED. The SARS-CoV-2 RNA is generally detectable in upper respiratoy specimens during the acute phase of infection. The lowest concentration of SARS-CoV-2 viral copies this assay can detect is 131 copies/mL. A negative result does not preclude SARS-Cov-2 infection and should not be used as the sole basis for treatment or other patient management decisions. A negative result may occur with  improper specimen collection/handling, submission of specimen  other than nasopharyngeal swab, presence of viral mutation(s) within the areas targeted by this assay, and inadequate number of viral copies (<131 copies/mL). A negative result must be combined with clinical observations, patient history, and epidemiological information. The expected result is Negative. Fact Sheet for Patients:  PinkCheek.be Fact Sheet for Healthcare Providers:  GravelBags.it This test is not yet ap proved or cleared by the [place] FDA and  has been authorized for detection and/or diagnosis of SARS-CoV-2 by FDA under an Emergency Use Authorization (EUA). This EUA will remain  in effect (meaning this test can be used) for the duration of the COVID-19 declaration under Section 564(b)(1) of the Act, 21 U.S.C. section 360bbb-3(b)(1), unless the authorization is terminated or revoked sooner.    Influenza A by PCR NEGATIVE NEGATIVE Final   Influenza B by PCR NEGATIVE NEGATIVE Final    Comment: (NOTE) The Xpert Xpress SARS-CoV-2/FLU/RSV assay is intended as an aid in  the diagnosis of influenza from Nasopharyngeal swab specimens and  should not be used as a sole basis for treatment. Nasal washings and  aspirates are unacceptable for Xpert Xpress SARS-CoV-2/FLU/RSV  testing. Fact Sheet for Patients: PinkCheek.be Fact Sheet for Healthcare Providers: GravelBags.it This test is not yet approved or cleared by the [place] FDA and  has been authorized for detection and/or diagnosis of SARS-CoV-2 by  FDA under an Emergency Use Authorization (EUA). This EUA will remain  in effect (meaning this test can be used) for the duration of the  Covid-19 declaration under Section 564(b)(1) of the Act, 21  U.S.C. section 360bbb-3(b)(1), unless the authorization is  terminated or revoked. Performed at [facility], [address], [place], [place]  [address]      Radiology Studies: DG Chest 2 View  Result Date: 05/07/2019 CLINICAL DATA:  Shortness of breath, worsening today. EXAM: CHEST - 2 VIEW COMPARISON:  Chest x-ray dated 08/02/2018. FINDINGS: Mild cardiomegaly is stable. Patchy airspace opacities bilaterally, RIGHT slightly greater than LEFT. Again noted is blunting of the bilateral costophrenic angles, indicating small bilateral pleural effusions and/or chronic pleural thickening. No pneumothorax seen. Osseous structures about the chest are unremarkable. IMPRESSION: Patchy bilateral airspace opacities , RIGHT greater than LEFT, pulmonary edema versus multifocal pneumonia. Electronically Signed   By: Franki Cabot M.D.   On: 05/07/2019 17:55    Scheduled Meds: . amLODipine  10 mg Oral Daily  . apixaban  2.5 mg Oral BID  . vitamin C  1,000 mg Oral Daily  . cholecalciferol  2,000 Units Oral Daily  . furosemide  40 mg Intravenous Once  . furosemide  60 mg Intravenous Q12H  .  hydrALAZINE  50 mg Oral TID  . insulin aspart  0-20 Units Subcutaneous TID WC  . insulin aspart  0-5 Units Subcutaneous QHS  . multivitamin with minerals  1 tablet Oral Daily  . nitroGLYCERIN  0.4 mg Sublingual UD  . omega-3 acid ethyl esters  1 capsule Oral Daily  . [START ON 05/09/2019] pneumococcal 23 valent vaccine  0.5 mL Intramuscular Tomorrow-1000  . rosuvastatin  5 mg Oral Once per day on Mon Wed Fri  . sodium chloride flush  3 mL Intravenous Q12H   Continuous Infusions: . sodium chloride       LOS: 1 day   Time spent: 45 minutes.  Lorella Nimrod, MD [facility]  If 7PM-7AM, please contact night-coverage [email or web address]  05/08/2019, 1:25 PM   This record has been created using Systems analyst. Errors have been sought and corrected,but may not always be located. Such creation errors do not reflect on the standard of care.

## 2019-05-08 NOTE — ED Notes (Signed)
Pt was given a sandwich tray and a beverage.

## 2019-05-08 NOTE — ED Notes (Signed)
Attempted to call report, floor unable to accept report at this time

## 2019-05-08 NOTE — Progress Notes (Signed)
Pt. Is refusing the bed alarm . Opt was educated on the reasoning for putting on a bed alarm. Pt stated " Im not happy about this alarm and its restricting me and Im going to talk with the doctor about this alarm." Bed alarm is off.

## 2019-05-08 NOTE — Progress Notes (Signed)
*  PRELIMINARY RESULTS* Echocardiogram 2D Echocardiogram has been performed.  Kyle Roberson Kyle Roberson Kyle Roberson 05/08/2019, 11:14 AM

## 2019-05-09 ENCOUNTER — Inpatient Hospital Stay: Payer: Medicare HMO

## 2019-05-09 DIAGNOSIS — R0602 Shortness of breath: Secondary | ICD-10-CM

## 2019-05-09 DIAGNOSIS — R600 Localized edema: Secondary | ICD-10-CM

## 2019-05-09 LAB — BASIC METABOLIC PANEL
Anion gap: 12 (ref 5–15)
BUN: 40 mg/dL — ABNORMAL HIGH (ref 8–23)
CO2: 26 mmol/L (ref 22–32)
Calcium: 9.3 mg/dL (ref 8.9–10.3)
Chloride: 104 mmol/L (ref 98–111)
Creatinine, Ser: 2.03 mg/dL — ABNORMAL HIGH (ref 0.61–1.24)
GFR calc Af Amer: 35 mL/min — ABNORMAL LOW (ref >60)
GFR calc non Af Amer: 30 mL/min — ABNORMAL LOW (ref >60)
Glucose, Bld: 138 mg/dL — ABNORMAL HIGH (ref 70–99)
Potassium: 4.2 mmol/L (ref 3.5–5.1)
Sodium: 142 mmol/L (ref 135–145)

## 2019-05-09 LAB — GLUCOSE, CAPILLARY
Glucose-Capillary: 139 mg/dL — ABNORMAL HIGH (ref 70–99)
Glucose-Capillary: 276 mg/dL — ABNORMAL HIGH (ref 70–99)
Glucose-Capillary: 128 mg/dL — ABNORMAL HIGH (ref 70–99)

## 2019-05-09 MED ORDER — TORSEMIDE 20 MG PO TABS: 40.0000 mg | ORAL_TABLET | Freq: Every day | ORAL | 0 refills | Status: DC

## 2019-05-09 MED ORDER — TORSEMIDE 20 MG PO TABS: 40.0000 mg | ORAL_TABLET | Freq: Every day | ORAL | Status: DC

## 2019-05-09 NOTE — Clinical Social Work Note (Addendum)
1.  Do you have a scale? Yes 2.  Do you weigh yourself daily? 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  Two pounds over night or 5 in one week call your doctor because it could be fluid overload? Pt aware 4.  Do you go to the Heart Failure Clinic? Cardioligist 5. An appointment with heart failure clinic will be made before discharge if.  Pt lives with significant other who he states he couldn't do without. Pt's significant other assist in pt's care. Pt declines any additional needs at this time.  [place], [place]

## 2019-05-09 NOTE — Discharge Summary (Signed)
Physician Discharge Summary  Kyle Roberson [id number] DOB: 1940-05-16 DOA: 05/07/2019  PCP: Glean Hess, MD  Admit date: 05/07/2019 Discharge date: 05/09/2019  Admitted From: Home Disposition:  Home  Recommendations for Outpatient Follow-up:  1. Follow up with PCP in 1-2 weeks 2. [place] up with your cardiologist in 1 week 3. Please obtain BMP/CBC in one week 4. Please follow up on the following pending results:None  Home Health: No.  Patient declined Equipment/Devices: None Discharge Condition: Stable CODE STATUS: Full Diet recommendation: Heart Healthy / Carb Modified   Brief/Interim Summary: Kyle Roberson a 79 y.o.malewith medical history significant fordiabetes, hypertension, CAD, chronic atrial fibrillation on Eliquis recent past history of complaints of persistent lower extremity edema and dyspnea on exertion, seen by cardiologist, Dr. Saunders Revel on 04/26/2019,with recent complaints as well of elevated blood pressure at home and recent up titration of his hydralazine and furosemide onwho presents to the emergency room with worsening dyspnea on exertion on awaking in the a.m. As well as orthopnea.  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  BNP was not very impressive.  Patient do have positive JVD along with lower extremity edema.  Troponin remain negative.  Chest x-ray with pulmonary edema.  COVID-19 was negative and there was no sign of any respiratory illness.  Echocardiogram was done which shows normal ejection fraction, indeterminate diastolic function and IVC with less than 50% compressibility which shows volume overload.  He was diuresed with IV Lasix, responded well.  IV Lasix was discontinued due to increase in his creatinine. His home dose of Lasix was switched with torsemide 40 mg daily for better diuretic effect with gut edema. Patient also had markedly distended abdomen, although soft.  Abdominal ultrasound was negative for any ascites.  There  was some abdominal wall edema.  He will continue his home dose of amlodipine and hydralazine along with torsemide and follow-up with cardiology for further management.  His heart rate remained well controlled.  No chest pain.  Patient was not on any beta-blocker.  We continued his home dose of Crestor and Eliquis.  Patient has uncontrolled diabetes with A1c trending down.  He was advised to follow-up with his PCP for further management.  Discharge Diagnoses:  Principal Problem:   Suspect Acute heart failure (HCC) Active Problems:   Essential hypertension   Type II diabetes mellitus with complication (HCC)   Chronic kidney disease, stage 3a   Coronary artery disease involving native coronary artery of native heart without angina pectoris   Persistent atrial fibrillation (HCC)   Morbid obesity (HCC)   SOB (shortness of breath)   Acute pulmonary edema (HCC)   Distended abdomen  Discharge Instructions  Discharge Instructions    (HEART FAILURE PATIENTS) Call MD:  Anytime you have any of the following symptoms: 1) 3 pound weight gain in 24 hours or 5 pounds in 1 week 2) shortness of breath, with or without a dry hacking cough 3) swelling in the hands, feet or stomach 4) if you have to sleep on extra pillows at night in order to breathe.   Complete by: As directed    AMB referral to CHF clinic   Complete by: As directed    Diet - low sodium heart healthy   Complete by: As directed    Diet - low sodium heart healthy   Complete by: As directed    Discharge instructions   Complete by: As directed    It was pleasure taking care of you. As we discussed you  will stop taking Lasix and start taking torsemide 40 mg daily from tomorrow. Please follow-up with your cardiologist and PCP in 1 to 2 weeks.   Increase activity slowly   Complete by: As directed    Increase activity slowly   Complete by: As directed      Allergies as of 05/09/2019      Reactions   Atorvastatin Other (See Comments)    Simvastatin Other (See Comments)      Medication List    STOP taking these medications   furosemide 40 MG tablet Commonly known as: LASIX     TAKE these medications   amLODipine 10 MG tablet Commonly known as: NORVASC Take 10 mg by mouth daily.   apixaban 2.5 MG Tabs tablet Commonly known as: ELIQUIS Take 1 tablet (2.5 mg total) by mouth 2 (two) times daily.   cholecalciferol 25 MCG (1000 UNIT) tablet Commonly known as: VITAMIN D3 Take 2,000 Units by mouth daily.   Flax Seed Oil 1000 MG Caps Take by mouth.   glipiZIDE 10 MG 24 hr tablet Commonly known as: GLUCOTROL XL Take 10 mg by mouth 2 (two) times daily.   hydrALAZINE 50 MG tablet Commonly known as: APRESOLINE Take 1 tablet (50 mg total) by mouth 3 (three) times daily.   insulin NPH Human 100 UNIT/ML injection Commonly known as: NOVOLIN N Inject 10-15 Units into the skin daily. Take 10-15 units twice a day after breakfast and before bedtime per Sliding Scale (BS>250: 15 units, BS= 200-250:10 units)   Lubricating Eye Drops 0.5-0.9 % ophthalmic solution Generic drug: carboxymethylcellul-glycerin Place 1 drop into the left eye as needed for dry eyes.   Maxitrol 0.1 % Oint Generic drug: neomycin-polymyxin-dexameth Place 1 application into the left eye 2 (two) times daily.   multivitamin with minerals tablet Take 1 tablet by mouth daily.   nitroGLYCERIN 0.4 MG/SPRAY spray Commonly known as: NITROLINGUAL Place 1 spray under the tongue as directed.   Omega 3 1000 MG Caps Take 1 capsule by mouth daily.   rosuvastatin 5 MG tablet Commonly known as: Crestor Take 1 tablet (5 mg total) by mouth 3 (three) times a week.   silver sulfADIAZINE 1 % cream Commonly known as: SILVADENE Apply 1 application topically 2 (two) times daily.   torsemide 20 MG tablet Commonly known as: DEMADEX Take 2 tablets (40 mg total) by mouth daily. Start taking on: May 10, 2019   vitamin C 1000 MG tablet Take 1,000 mg by  mouth daily.       Allergies  Allergen Reactions  . Atorvastatin Other (See Comments)  . Simvastatin Other (See Comments)    Consultations:  None  Procedures/Studies: DG Chest 2 View  Result Date: 05/09/2019 CLINICAL DATA:  Shortness of breath EXAM: CHEST - 2 VIEW COMPARISON:  05/07/2019 FINDINGS: Cardiomegaly. Patchy airspace opacities bilaterally, right greater than left, unchanged. Small bilateral effusions. No acute bony abnormality. IMPRESSION: Stable patchy bilateral airspace disease and small effusions. Electronically Signed   By: Rolm Baptise M.D.   On: 05/09/2019 08:42   DG Chest 2 View  Result Date: 05/07/2019 CLINICAL DATA:  Shortness of breath, worsening today. EXAM: CHEST - 2 VIEW COMPARISON:  Chest x-ray dated 08/02/2018. FINDINGS: Mild cardiomegaly is stable. Patchy airspace opacities bilaterally, RIGHT slightly greater than LEFT. Again noted is blunting of the bilateral costophrenic angles, indicating small bilateral pleural effusions and/or chronic pleural thickening. No pneumothorax seen. Osseous structures about the chest are unremarkable. IMPRESSION: Patchy bilateral airspace opacities , RIGHT greater  than LEFT, pulmonary edema versus multifocal pneumonia. Electronically Signed   By: Franki Cabot M.D.   On: 05/07/2019 17:55   US Abdomen Limited  Result Date: 05/08/2019 CLINICAL DATA:  Evaluate for possible ascites, abdominal distension EXAM: LIMITED ABDOMEN ULTRASOUND FOR ASCITES TECHNIQUE: Limited ultrasound survey for ascites was performed in all four abdominal quadrants. COMPARISON:  None. FINDINGS: Evaluation of all 4 quadrants of the abdomen shows no evidence of ascites. IMPRESSION: No ascites identified. Electronically Signed   By: Inez Catalina M.D.   On: 05/08/2019 14:47   ECHOCARDIOGRAM COMPLETE  Result Date: 05/08/2019    ECHOCARDIOGRAM REPORT   Patient Name:   Kyle Roberson Date of Exam: 05/08/2019 Medical Rec #:  [id number]         Height:       68.0 in  Accession #:    [id number]        Weight:       270.1 lb Date of Birth:  03/17/1941        BSA:          2.32 m Patient Age:    64 years          BP:           157/75 mmHg Patient Gender: M                 HR:           73 bpm. Exam Location:  [facility] Procedure: 2D Echo, Color Doppler and Cardiac Doppler Indications:     I50.9 Congestive Heart Failure  History:         Patient has prior history of Echocardiogram examinations, most                  recent 07/07/2018. CAD; Risk Factors:Hypertension, Diabetes and                  Dyslipidemia.  Sonographer:     Charmayne Sheer RDCS (AE) Referring Phys:  [id number] Athena Masse Diagnosing Phys: Kathlyn Sacramento MD IMPRESSIONS  1. Left ventricular ejection fraction, by estimation, is 55 to 60%. The left ventricle has normal function. The left ventricle has no regional wall motion abnormalities. There is mild left ventricular hypertrophy. Left ventricular diastolic parameters are indeterminate.  2. Right ventricular systolic function is normal. The right ventricular size is normal. There is moderately elevated pulmonary artery systolic pressure. The estimated right ventricular systolic pressure is 99991111 mmHg.  3. Left atrial size was moderately dilated.  4. The mitral valve is normal in structure and function. Mild to moderate mitral valve regurgitation. No evidence of mitral stenosis.  5. The aortic valve is normal in structure and function. Aortic valve regurgitation is mild. Mild aortic valve sclerosis is present, with no evidence of aortic valve stenosis.  6. Moderately dilated pulmonary artery.  7. The inferior vena cava is dilated in size with <50% respiratory variability, suggesting right atrial pressure of 15 mmHg. FINDINGS  Left Ventricle: Left ventricular ejection fraction, by estimation, is 55 to 60%. The left ventricle has normal function. The left ventricle has no regional wall motion abnormalities. The left ventricular internal cavity size was normal in size. There is   mild left ventricular hypertrophy. Left ventricular diastolic parameters are indeterminate. Right Ventricle: The right ventricular size is normal. No increase in right ventricular wall thickness. Right ventricular systolic function is normal. There is moderately elevated pulmonary artery systolic pressure. The tricuspid regurgitant velocity is 3.19 m/s, and with an  assumed right atrial pressure of 15 mmHg, the estimated right ventricular systolic pressure is 99991111 mmHg. Left Atrium: Left atrial size was moderately dilated. Right Atrium: Right atrial size was normal in size. Pericardium: There is no evidence of pericardial effusion. Mitral Valve: The mitral valve is normal in structure and function. Normal mobility of the mitral valve leaflets. Mild to moderate mitral valve regurgitation. No evidence of mitral valve stenosis. MV peak gradient, 10.4 mmHg. The mean mitral valve gradient is 4.0 mmHg. Tricuspid Valve: The tricuspid valve is normal in structure. Tricuspid valve regurgitation is mild . No evidence of tricuspid stenosis. Aortic Valve: The aortic valve is normal in structure and function. Aortic valve regurgitation is mild. Aortic regurgitation PHT measures 854 msec. Mild aortic valve sclerosis is present, with no evidence of aortic valve stenosis. Aortic valve mean gradient measures 7.0 mmHg. Aortic valve peak gradient measures 11.6 mmHg. Aortic valve area, by VTI measures 2.64 cm. Pulmonic Valve: The pulmonic valve was normal in structure. Pulmonic valve regurgitation is trivial. No evidence of pulmonic stenosis. Aorta: The aortic root is normal in size and structure. Pulmonary Artery: The pulmonary artery is moderately dilated. Venous: The inferior vena cava is dilated in size with less than 50% respiratory variability, suggesting right atrial pressure of 15 mmHg. IAS/Shunts: No atrial level shunt detected by color flow Doppler.  LEFT VENTRICLE PLAX 2D LVIDd:         4.96 cm  Diastology LVIDs:          3.60 cm  LV e' lateral:   12.80 cm/s LV PW:         1.34 cm  LV E/e' lateral: 10.1 LV IVS:        0.80 cm  LV e' medial:    8.92 cm/s LVOT diam:     2.00 cm  LV E/e' medial:  14.5 LV SV:         73.51 ml LV SV Index:   24.82 LVOT Area:     3.14 cm  RIGHT VENTRICLE RV Basal diam:  3.69 cm LEFT ATRIUM             Index       RIGHT ATRIUM           Index LA diam:        4.80 cm 2.07 cm/m  RA Area:     18.10 cm LA Vol (A2C):   77.6 ml 33.42 ml/m RA Volume:   47.80 ml  20.58 ml/m LA Vol (A4C):   95.1 ml 40.95 ml/m LA Biplane Vol: 87.0 ml 37.47 ml/m  AORTIC VALVE                    PULMONIC VALVE AV Area (Vmax):    2.35 cm     PV Vmax:       1.11 m/s AV Area (Vmean):   2.10 cm     PV Vmean:      71.200 cm/s AV Area (VTI):     2.64 cm     PV VTI:        0.192 m AV Vmax:           170.00 cm/s  PV Peak grad:  4.9 mmHg AV Vmean:          122.000 cm/s PV Mean grad:  2.0 mmHg AV VTI:            0.278 m AV Peak Grad:      11.6 mmHg AV  Mean Grad:      7.0 mmHg LVOT Vmax:         127.00 cm/s LVOT Vmean:        81.700 cm/s LVOT VTI:          0.234 m LVOT/AV VTI ratio: 0.84 AI PHT:            854 msec  AORTA Ao Root diam: 3.10 cm MITRAL VALVE                TRICUSPID VALVE MV Area (PHT): 4.58 cm     TR Peak grad:   40.7 mmHg MV Peak grad:  10.4 mmHg    TR Vmax:        319.00 cm/s MV Mean grad:  4.0 mmHg MV Vmax:       1.61 m/s     SHUNTS MV Vmean:      87.0 cm/s    Systemic VTI:  0.23 m MV Decel Time: 166 msec     Systemic Diam: 2.00 cm MV E velocity: 129.50 cm/s Kathlyn Sacramento MD Electronically signed by Kathlyn Sacramento MD Signature Date/Time: 05/08/2019/3:16:56 PM    Final      Subjective: Patient was feeling better when seen this morning.  He has no new complaints.  He would like to go back home and asking to talk with his caregiver Jeannene Patella.  I called [name] who provide 24/7 care for him.  She will pick him up after 5 today.  We discussed the change in his medications which he understand.  We also discussed follow-up with  his cardiologist and PCP.  Discharge Exam: Vitals:   05/09/19 0721 05/09/19 1134  BP: (!) 162/69 (!) 154/65  Pulse: 63 71  Resp: 18 18  Temp: 97.8 F (36.6 C) 97.9 F (36.6 C)  SpO2: 95% 94%   Vitals:   05/08/19 1922 05/09/19 0521 05/09/19 0721 05/09/19 1134  BP: (!) 172/69 (!) 142/68 (!) 162/69 (!) 154/65  Pulse: 69 70 63 71  Resp: 20 20 18 18   Temp: (!) 97.5 F (36.4 C) 97.8 F (36.6 C) 97.8 F (36.6 C) 97.9 F (36.6 C)  TempSrc: Oral Oral Oral Oral  SpO2: 93% 93% 95% 94%  Weight:  123.1 kg    Height:        General: Pt is alert, awake, not in acute distress Cardiovascular: RRR, S1/S2 +, no rubs, no gallops Respiratory: CTA bilaterally, no wheezing, no rhonchi Abdominal: Soft, NT, ND, bowel sounds + Extremities: 1+ LE edema, no cyanosis   The results of significant diagnostics from this hospitalization (including imaging, microbiology, ancillary and laboratory) are listed below for reference.    Microbiology: Recent Results (from the past 240 hour(s))  Blood Culture (routine x 2)     Status: None (Preliminary result)   Collection Time: 05/07/19  6:45 PM   Specimen: BLOOD  Result Value Ref Range Status   Specimen Description BLOOD R AC  Final   Special Requests   Final    BOTTLES DRAWN AEROBIC AND ANAEROBIC Blood Culture results may not be optimal due to an excessive volume of blood received in culture bottles   Culture   Final    NO GROWTH 2 DAYS Performed at [facility], [address] [place], [place] [address]    Report Status PENDING  Incomplete  Blood Culture (routine x 2)     Status: None (Preliminary result)   Collection Time: 05/07/19  6:45 PM   Specimen: BLOOD  Result  Value Ref Range Status   Specimen Description BLOOD R HAND  Final   Special Requests   Final    BOTTLES DRAWN AEROBIC AND ANAEROBIC Blood Culture results may not be optimal due to an inadequate volume of blood received in culture bottles   Culture   Final    NO GROWTH 2  DAYS Performed at [facility], [address], [place], [place] [address]    Report Status PENDING  Incomplete  Respiratory Panel by RT PCR (Flu A&B, Covid) - Nasopharyngeal Swab     Status: None   Collection Time: 05/07/19 11:58 PM   Specimen: Nasopharyngeal Swab  Result Value Ref Range Status   SARS Coronavirus 2 by RT PCR NEGATIVE NEGATIVE Final    Comment: (NOTE) SARS-CoV-2 target nucleic acids are NOT DETECTED. The SARS-CoV-2 RNA is generally detectable in upper respiratoy specimens during the acute phase of infection. The lowest concentration of SARS-CoV-2 viral copies this assay can detect is 131 copies/mL. A negative result does not preclude SARS-Cov-2 infection and should not be used as the sole basis for treatment or other patient management decisions. A negative result may occur with  improper specimen collection/handling, submission of specimen other than nasopharyngeal swab, presence of viral mutation(s) within the areas targeted by this assay, and inadequate number of viral copies (<131 copies/mL). A negative result must be combined with clinical observations, patient history, and epidemiological information. The expected result is Negative. Fact Sheet for Patients:  PinkCheek.be Fact Sheet for Healthcare Providers:  GravelBags.it This test is not yet ap proved or cleared by the [place] FDA and  has been authorized for detection and/or diagnosis of SARS-CoV-2 by FDA under an Emergency Use Authorization (EUA). This EUA will remain  in effect (meaning this test can be used) for the duration of the COVID-19 declaration under Section 564(b)(1) of the Act, 21 U.S.C. section 360bbb-3(b)(1), unless the authorization is terminated or revoked sooner.    Influenza A by PCR NEGATIVE NEGATIVE Final   Influenza B by PCR NEGATIVE NEGATIVE Final    Comment: (NOTE) The Xpert Xpress SARS-CoV-2/FLU/RSV assay  is intended as an aid in  the diagnosis of influenza from Nasopharyngeal swab specimens and  should not be used as a sole basis for treatment. Nasal washings and  aspirates are unacceptable for Xpert Xpress SARS-CoV-2/FLU/RSV  testing. Fact Sheet for Patients: PinkCheek.be Fact Sheet for Healthcare Providers: GravelBags.it This test is not yet approved or cleared by the [place] FDA and  has been authorized for detection and/or diagnosis of SARS-CoV-2 by  FDA under an Emergency Use Authorization (EUA). This EUA will remain  in effect (meaning this test can be used) for the duration of the  Covid-19 declaration under Section 564(b)(1) of the Act, 21  U.S.C. section 360bbb-3(b)(1), unless the authorization is  terminated or revoked. Performed at [facility], [address] [place], [place] [address]      Labs: BNP (last 3 results) Recent Labs    04/26/19 1629 05/07/19 1731  BNP 169.0* [id number]   Basic Metabolic Panel: Recent Labs  Lab 05/07/19 1731 05/08/19 0517 05/09/19 0650  NA 137 139 142  K 4.1 3.7 4.2  CL 104 105 104  CO2 23 26 26   GLUCOSE 189* 144* 138*  BUN 36* 33* 40*  CREATININE 1.90* 1.84* 2.03*  CALCIUM 9.0 8.9 9.3   Liver Function Tests: No results for input(s): AST, ALT, ALKPHOS, BILITOT, PROT, ALBUMIN in the last 168 hours. No results for input(s):  LIPASE, AMYLASE in the last 168 hours. No results for input(s): AMMONIA in the last 168 hours. CBC: Recent Labs  Lab 05/07/19 1731  WBC 8.9  NEUTROABS 6.2  HGB 12.1*  HCT 39.2  MCV 91.0  PLT 235   Cardiac Enzymes: No results for input(s): CKTOTAL, CKMB, CKMBINDEX, TROPONINI in the last 168 hours. BNP: Invalid input(s): POCBNP CBG: Recent Labs  Lab 05/08/19 1144 05/08/19 1618 05/08/19 2112 05/09/19 0722 05/09/19 1152  GLUCAP 116* 135* 129* 139* 276*   D-Dimer No results for input(s): DDIMER in the last 72 hours. Hgb  A1c Recent Labs    05/08/19 0051  HGBA1C 8.7*   Lipid Profile No results for input(s): CHOL, HDL, LDLCALC, TRIG, CHOLHDL, LDLDIRECT in the last 72 hours. Thyroid function studies No results for input(s): TSH, T4TOTAL, T3FREE, THYROIDAB in the last 72 hours.  Invalid input(s): FREET3 Anemia work up No results for input(s): VITAMINB12, FOLATE, FERRITIN, TIBC, IRON, RETICCTPCT in the last 72 hours. Urinalysis    Component Value Date/Time   COLORURINE YELLOW (A) 01/02/2019 2131   APPEARANCEUR CLOUDY (A) 01/02/2019 2131   LABSPEC 1.013 01/02/2019 2131   PHURINE 5.0 01/02/2019 2131   GLUCOSEU >=500 (A) 01/02/2019 2131   HGBUR MODERATE (A) 01/02/2019 2131   BILIRUBINUR neg 01/10/2019 [place] 01/02/2019 2131   PROTEINUR Positive (A) 01/10/2019 1628   PROTEINUR 30 (A) 01/02/2019 2131   UROBILINOGEN 0.2 01/10/2019 1628   NITRITE neg 01/10/2019 1628   NITRITE NEGATIVE 01/02/2019 2131   LEUKOCYTESUR Negative 01/10/2019 1628   LEUKOCYTESUR NEGATIVE 01/02/2019 2131   Sepsis Labs Invalid input(s): PROCALCITONIN,  WBC,  LACTICIDVEN Microbiology Recent Results (from the past 240 hour(s))  Blood Culture (routine x 2)     Status: None (Preliminary result)   Collection Time: 05/07/19  6:45 PM   Specimen: BLOOD  Result Value Ref Range Status   Specimen Description BLOOD R AC  Final   Special Requests   Final    BOTTLES DRAWN AEROBIC AND ANAEROBIC Blood Culture results may not be optimal due to an excessive volume of blood received in culture bottles   Culture   Final    NO GROWTH 2 DAYS Performed at [facility], [address] [place], [place] [address]    Report Status PENDING  Incomplete  Blood Culture (routine x 2)     Status: None (Preliminary result)   Collection Time: 05/07/19  6:45 PM   Specimen: BLOOD  Result Value Ref Range Status   Specimen Description BLOOD R HAND  Final   Special Requests   Final    BOTTLES DRAWN AEROBIC AND ANAEROBIC Blood  Culture results may not be optimal due to an inadequate volume of blood received in culture bottles   Culture   Final    NO GROWTH 2 DAYS Performed at [facility], [address], [place], [place] [address]    Report Status PENDING  Incomplete  Respiratory Panel by RT PCR (Flu A&B, Covid) - Nasopharyngeal Swab     Status: None   Collection Time: 05/07/19 11:58 PM   Specimen: Nasopharyngeal Swab  Result Value Ref Range Status   SARS Coronavirus 2 by RT PCR NEGATIVE NEGATIVE Final    Comment: (NOTE) SARS-CoV-2 target nucleic acids are NOT DETECTED. The SARS-CoV-2 RNA is generally detectable in upper respiratoy specimens during the acute phase of infection. The lowest concentration of SARS-CoV-2 viral copies this assay can detect is 131 copies/mL. A negative result does not preclude  SARS-Cov-2 infection and should not be used as the sole basis for treatment or other patient management decisions. A negative result may occur with  improper specimen collection/handling, submission of specimen other than nasopharyngeal swab, presence of viral mutation(s) within the areas targeted by this assay, and inadequate number of viral copies (<131 copies/mL). A negative result must be combined with clinical observations, patient history, and epidemiological information. The expected result is Negative. Fact Sheet for Patients:  PinkCheek.be Fact Sheet for Healthcare Providers:  GravelBags.it This test is not yet ap proved or cleared by the [place] FDA and  has been authorized for detection and/or diagnosis of SARS-CoV-2 by FDA under an Emergency Use Authorization (EUA). This EUA will remain  in effect (meaning this test can be used) for the duration of the COVID-19 declaration under Section 564(b)(1) of the Act, 21 U.S.C. section 360bbb-3(b)(1), unless the authorization is terminated or revoked sooner.    Influenza A by PCR  NEGATIVE NEGATIVE Final   Influenza B by PCR NEGATIVE NEGATIVE Final    Comment: (NOTE) The Xpert Xpress SARS-CoV-2/FLU/RSV assay is intended as an aid in  the diagnosis of influenza from Nasopharyngeal swab specimens and  should not be used as a sole basis for treatment. Nasal washings and  aspirates are unacceptable for Xpert Xpress SARS-CoV-2/FLU/RSV  testing. Fact Sheet for Patients: PinkCheek.be Fact Sheet for Healthcare Providers: GravelBags.it This test is not yet approved or cleared by the [place] FDA and  has been authorized for detection and/or diagnosis of SARS-CoV-2 by  FDA under an Emergency Use Authorization (EUA). This EUA will remain  in effect (meaning this test can be used) for the duration of the  Covid-19 declaration under Section 564(b)(1) of the Act, 21  U.S.C. section 360bbb-3(b)(1), unless the authorization is  terminated or revoked. Performed at [facility], [address] [place], [place] [address]     Time coordinating discharge: Over 30 minutes  SIGNED:  Lorella Nimrod, MD  [facility] 05/09/2019, 1:53 PM  If 7PM-7AM, please contact night-coverage [email or web address]  This record has been created using Systems analyst. Errors have been sought and corrected,but may not always be located. Such creation errors do not reflect on the standard of care.

## 2019-05-10 ENCOUNTER — Telehealth: Payer: Self-pay

## 2019-05-10 ENCOUNTER — Telehealth: Payer: Self-pay | Admitting: Family

## 2019-05-10 NOTE — Telephone Encounter (Signed)
Transition Care Management Follow-up Telephone Call  Date of discharge and from where: 05/09/19 [facility]  How have you been since you were released from the hospital? Pt states he is doing okay  Any questions or concerns? No   Items Reviewed:  Did the pt receive and understand the discharge instructions provided? Yes   Medications obtained and verified? Yes   Any new allergies since your discharge? No   Dietary orders reviewed? Yes - heart healthy  Do you have support at home? Yes   Functional Questionnaire: (I = Independent and D = Dependent) ADLs: I  Bathing/Dressing- I  Meal Prep- I  Eating- I  Maintaining continence- I  Transferring/Ambulation- I  Managing Meds- I  Follow up appointments reviewed:   PCP Hospital f/u appt confirmed? Yes  Scheduled to see Dr. Aretha Parrot on 05/16/19 @ 3:20 for f/u & labs BMP/CBC.  [facility] f/u appt confirmed? No  Awaiting appt for heart failure clinic referral - cardiology scheduled in March  Are transportation arrangements needed? No   If their condition worsens, is the pt aware to call PCP or go to the Emergency Dept.? Yes  Was the patient provided with contact information for the PCP's office or ED? Yes  Was to pt encouraged to call back with questions or concerns? Yes

## 2019-05-10 NOTE — Telephone Encounter (Signed)
Spoke with patients caregiver [name] and She states he is doing well since he discharged from the hospital. He is having no symptoms with exception of minor SOB. He is following a low sodium diet, taking meds without problems, and checking weight daily at home per hospital dc instructions. While on phone we had him scheduled for a New Patient CHF clinic appt for 3/2 at 330pm.    Alyse Low, NT

## 2019-05-12 ENCOUNTER — Other Ambulatory Visit: Payer: Self-pay | Admitting: Internal Medicine

## 2019-05-12 LAB — CULTURE, BLOOD (ROUTINE X 2)
Culture: NO GROWTH
Culture: NO GROWTH

## 2019-05-16 ENCOUNTER — Encounter: Payer: Self-pay | Admitting: Internal Medicine

## 2019-05-16 ENCOUNTER — Ambulatory Visit (INDEPENDENT_AMBULATORY_CARE_PROVIDER_SITE_OTHER): Payer: Medicare HMO | Admitting: Internal Medicine

## 2019-05-16 ENCOUNTER — Other Ambulatory Visit: Payer: Self-pay

## 2019-05-16 VITALS — BP 118/78 | HR 54 | Temp 97.6°F | Ht 68.0 in | Wt 263.0 lb

## 2019-05-16 DIAGNOSIS — I5032 Chronic diastolic (congestive) heart failure: Secondary | ICD-10-CM | POA: Diagnosis not present

## 2019-05-16 DIAGNOSIS — I872 Venous insufficiency (chronic) (peripheral): Secondary | ICD-10-CM

## 2019-05-16 DIAGNOSIS — J810 Acute pulmonary edema: Secondary | ICD-10-CM | POA: Diagnosis not present

## 2019-05-16 DIAGNOSIS — N1831 Chronic kidney disease, stage 3a: Secondary | ICD-10-CM | POA: Diagnosis not present

## 2019-05-16 DIAGNOSIS — H02105 Unspecified ectropion of left lower eyelid: Secondary | ICD-10-CM

## 2019-05-16 MED ORDER — SILVER SULFADIAZINE 1 % EX CREA: 1.0000 "application " | TOPICAL_CREAM | Freq: Two times a day (BID) | CUTANEOUS | 2 refills | Status: DC

## 2019-05-16 NOTE — Progress Notes (Signed)
Date:  05/16/2019   Name:  Kyle Roberson   DOB:  09-Nov-1940   MRN:  [id number]   Chief Complaint: Hospitalization Follow-up Hospital follow up. Admitted to [facility] 05/07/19 to 05/09/19.  He received a TOC call on 05/10/19. He is here today with his significant other, [name]. Recommendations for Outpatient Follow-up:  1. Follow up with PCP in 1-2 weeks 2. [place] up with your cardiologist in 1 week 3. Please obtain BMP/CBC in one week 4. Please follow up on the following pending results:None  Home Health: No.  Patient declined Equipment/Devices: None Discharge Condition: Stable CODE STATUS: Full Diet recommendation: Heart Healthy / Carb Modified   Brief/Interim Summary: Kyle Roberson a 79 y.o.malewith medical history significant fordiabetes, hypertension, CAD, chronic atrial fibrillation on Eliquis recent past history of complaints of persistent lower extremity edema and dyspnea on exertion, seen by cardiologist, Dr. Saunders Revel on 04/26/2019,with recent complaints as well of elevated blood pressure at home and recent up titration of his hydralazine and furosemide onwho presents to the emergency room with worsening dyspnea on exertion on awaking in the a.m. As well as orthopnea.Admitted for acute on chronic congestive heart failure.  BNP was not very impressive.  Patient do have positive JVD along with lower extremity edema.  Troponin remain negative.  Chest x-ray with pulmonary edema.  COVID-19 was negative and there was no sign of any respiratory illness.  Echocardiogram was done which shows normal ejection fraction, indeterminate diastolic function and IVC with less than 50% compressibility which shows volume overload.  He was diuresed with IV Lasix, responded well.  IV Lasix was discontinued due to increase in his creatinine. His home dose of Lasix was switched with torsemide 40 mg daily for better diuretic effect with gut edema. Patient also had markedly distended abdomen, although  soft.  Abdominal ultrasound was negative for any ascites.  There was some abdominal wall edema.  He will continue his home dose of amlodipine and hydralazine along with torsemide and follow-up with cardiology for further management.  Congestive Heart Failure Presents for follow-up (also has cardiology follow up 3/2, 3/8 and 3/11) visit. Associated symptoms include edema, orthopnea, paroxysmal nocturnal dyspnea and shortness of breath. Pertinent negatives include no abdominal pain, chest pain or fatigue. The symptoms have been improving. Compliance problems include adherence to exercise.  Compliance with exercise is 0-25%. Compliance with medications: Lasix was stopped and Torsemide started.  Eye Problem  The left eye is affected. This is a new problem. The current episode started in the past 7 days. The problem occurs constantly. Injury mechanism: possible infection since eye injections. Associated symptoms include blurred vision, an eye discharge, eye redness, a foreign body sensation and itching. Pertinent negatives include no fever or nausea. Treatments tried: antibiotic/steroid eye ointment. The treatment provided no relief.    Lab Results  Component Value Date   CREATININE 2.03 (H) 05/09/2019   BUN 40 (H) 05/09/2019   NA 142 05/09/2019   K 4.2 05/09/2019   CL 104 05/09/2019   CO2 26 05/09/2019   Lab Results  Component Value Date   CHOL 137 07/22/2018   HDL 44 07/22/2018   LDLCALC 73 07/22/2018   TRIG 116 07/22/2018   Lab Results  Component Value Date   TSH 1.568 08/02/2018   Lab Results  Component Value Date   HGBA1C 8.7 (H) 05/08/2019   Lab Results  Component Value Date   WBC 8.9 05/07/2019   HGB 12.1 (L) 05/07/2019   HCT 39.2 05/07/2019  MCV 91.0 05/07/2019   PLT 235 05/07/2019     Review of Systems  Constitutional: Negative for chills, fatigue and fever.  Eyes: Positive for blurred vision, discharge, redness and itching.  Respiratory: Positive for shortness of  breath. Negative for chest tightness and wheezing.   Cardiovascular: Positive for leg swelling. Negative for chest pain.  Gastrointestinal: Negative for abdominal pain, constipation and nausea.  Genitourinary: Positive for frequency. Negative for difficulty urinating.  Neurological: Negative for dizziness and headaches.  Psychiatric/Behavioral: Negative for sleep disturbance.    Patient Active Problem List   Diagnosis Date Noted  . Acute pulmonary edema (HCC)   . Distended abdomen   . SOB (shortness of breath) 05/07/2019  . Suspect Acute heart failure ([place]) 05/07/2019  . 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 CKD (chronic kidney disease) stage 4, GFR 15-29 ml/min (HCC) 04/27/2019  . Chronic heart failure with preserved ejection fraction (HFpEF) ([place]) 04/27/2019  . Morbid obesity ([place]) 04/27/2019  . Lower extremity edema 03/23/2019  . Erectile dysfunction 08/01/2018  . History of colonic diverticulitis 08/01/2018  . 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 Obesity (BMI 30-39.9) 08/01/2018  . Venous insufficiency 08/01/2018  . Persistent atrial fibrillation ([place]) 08/01/2018  . 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 Essential hypertension 07/06/2018  . Type II diabetes mellitus with complication ([place]) [id number]  . Coronary artery disease involving native coronary artery of native heart without angina pectoris 06/05/2018  . 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[place]) 01/30/2017  . Vitamin D deficiency 01/30/2017  . Benign non-nodular prostatic hyperplasia with lower urinary tract symptoms 09/29/2014  . Chronic kidney disease, stage 3a 06/27/2014  . History of MI (myocardial infarction) 06/27/2014  . Nephrolithiasis 01/04/2012    Allergies  Allergen Reactions  . Atorvastatin Other (See Comments)  . Simvastatin Other (See Comments)    Past Surgical History:  Procedure Laterality Date  . CORONARY STENT INTERVENTION    . CYSTOSCOPY    . HERNIA REPAIR       Social History   Tobacco Use  . Smoking status: Former Research scientist (life sciences)  . Smokeless tobacco: Never Used  Substance Use Topics  . Alcohol use: Yes    Alcohol/week: 2.0 standard drinks    Types: 2 Glasses of wine per week  . Drug use: Not Currently     Medication list has been reviewed and updated.  Current Meds  Medication Sig  . amLODipine (NORVASC) 10 MG tablet Take 10 mg by mouth daily.  Marland Kitchen apixaban (ELIQUIS) 2.5 MG TABS tablet Take 1 tablet (2.5 mg total) by mouth 2 (two) times daily.  . Ascorbic Acid (VITAMIN C) 1000 MG tablet Take 1,000 mg by mouth daily.  . carboxymethylcellul-glycerin (LUBRICATING EYE DROPS) 0.5-0.9 % ophthalmic solution Place 1 drop into the left eye as needed for dry eyes.  . Cholecalciferol (VITAMIN D) 50 MCG (2000 UT) tablet   . Flaxseed, Linseed, (FLAX SEED OIL) 1000 MG CAPS Take by mouth.  Marland Kitchen glipiZIDE (GLUCOTROL XL) 10 MG 24 hr tablet Take 10 mg by mouth 2 (two) times daily.   . hydrALAZINE (APRESOLINE) 50 MG tablet Take 1 tablet (50 mg total) by mouth 3 (three) times daily.  . insulin NPH Human (NOVOLIN N) 100 UNIT/ML injection Inject 10-15 Units into the skin daily. Take 10-15 units twice a day after breakfast and before bedtime per Sliding Scale (BS>250: 15 units, BS= 200-250:10 units)  . MAXITROL 3.5-10000-0.1 OINT Place 1 application into the left eye 2 (two) times daily.   . Multiple  Vitamins-Minerals (MULTIVITAMIN WITH MINERALS) tablet Take 1 tablet by mouth daily.  . nitroGLYCERIN (NITROLINGUAL) 0.4 MG/SPRAY spray Place 1 spray under the tongue as directed.  . Omega 3 1000 MG CAPS Take 1 capsule by mouth daily.  . rosuvastatin (CRESTOR) 5 MG tablet Take 1 tablet (5 mg total) by mouth 3 (three) times a week.  . silver sulfADIAZINE (SILVADENE) 1 % cream Apply 1 application topically 2 (two) times daily.  Marland Kitchen torsemide (DEMADEX) 20 MG tablet Take 2 tablets (40 mg total) by mouth daily.    PHQ 2/9 Scores 05/16/2019 04/07/2019 01/10/2019 11/08/2018  PHQ - 2  Score 4 2 0 0  PHQ- 9 Score 8 4 - -    BP Readings from Last 3 Encounters:  05/16/19 118/78  05/09/19 105/72  04/26/19 (!) 162/68    Physical Exam Vitals and nursing note reviewed.  Constitutional:      General: He is not in acute distress.    Appearance: He is well-developed. He is obese.  HENT:     Head: Normocephalic and atraumatic.  Eyes:   Cardiovascular:     Rate and Rhythm: Normal rate and regular rhythm.     Heart sounds: No murmur.  Pulmonary:     Effort: Pulmonary effort is normal. No respiratory distress.     Breath sounds: No wheezing or rhonchi.  Musculoskeletal:     Cervical back: Normal range of motion.     Right lower leg: Edema present.     Left lower leg: Edema present.  Lymphadenopathy:     Cervical: No cervical adenopathy.  Skin:    General: Skin is warm and dry.     Findings: No rash.     Comments: Chronic stasis dermatitis changes both LE; L>R  Neurological:     Mental Status: He is alert and oriented to person, place, and time.  Psychiatric:        Attention and Perception: Attention normal.        Mood and Affect: Mood normal.        Behavior: Behavior normal.        Thought Content: Thought content normal.     Wt Readings from Last 3 Encounters:  05/16/19 263 lb (119.3 kg)  05/09/19 271 lb 4.8 oz (123.1 kg)  04/26/19 276 lb (125.2 kg)    BP 118/78   Pulse (!) 54   Temp 97.6 F (36.4 C) (Oral)   Ht 5\' 8"  (1.727 m)   Wt 263 lb (119.3 kg)   SpO2 97%   BMI 39.99 kg/m   Assessment and Plan: 1. Chronic heart failure with preserved ejection fraction (HFpEF) ([place]) Appears much improved on exam today Continue current regimen and will check labs Follow up with Cardiology as planned - CBC with Differential/Platelet - Basic metabolic panel  2. Acute pulmonary edema (HCC) Good O2 sats today with improved exam Continue Torsemide and hydralazine  3. Chronic kidney disease, stage 3a Checking labs today  4. Venous insufficiency -  silver sulfADIAZINE (SILVADENE) 1 % cream; Apply 1 application topically 2 (two) times daily. To leg wounds  Dispense: 50 g; Refill: 2  5. Ectropion of left lower eyelid, unspecified ectropion type Recommend ophthalmology evaluation ASAP   Partially dictated using Dragon software. Any errors are unintentional.  Halina Maidens, MD [facility]  05/16/2019

## 2019-05-17 LAB — CBC WITH DIFFERENTIAL/PLATELET
Basophils Absolute: 0 10*3/uL (ref 0.0–0.2)
Basos: 1 %
EOS (ABSOLUTE): 0.4 10*3/uL (ref 0.0–0.4)
Eos: 5 %
Hematocrit: 38.8 % (ref 37.5–51.0)
Hemoglobin: 12.1 g/dL — ABNORMAL LOW (ref 13.0–17.7)
Immature Grans (Abs): 0 10*3/uL (ref 0.0–0.1)
Immature Granulocytes: 0 %
Lymphocytes Absolute: 1.5 10*3/uL (ref 0.7–3.1)
Lymphs: 17 %
MCH: 27.6 pg (ref 26.6–33.0)
MCHC: 31.2 g/dL — ABNORMAL LOW (ref 31.5–35.7)
MCV: 88 fL (ref 79–97)
Monocytes Absolute: 0.8 10*3/uL (ref 0.1–0.9)
Monocytes: 9 %
Neutrophils Absolute: 6 10*3/uL (ref 1.4–7.0)
Neutrophils: 68 %
Platelets: 246 10*3/uL (ref 150–450)
RBC: 4.39 x10E6/uL (ref 4.14–5.80)
RDW: 14.5 % (ref 11.6–15.4)
WBC: 8.8 10*3/uL (ref 3.4–10.8)

## 2019-05-17 LAB — BASIC METABOLIC PANEL
BUN/Creatinine Ratio: 19 (ref 10–24)
BUN: 39 mg/dL — ABNORMAL HIGH (ref 8–27)
CO2: 27 mmol/L (ref 20–29)
Calcium: 9.5 mg/dL (ref 8.6–10.2)
Chloride: 100 mmol/L (ref 96–106)
Creatinine, Ser: 2.06 mg/dL — ABNORMAL HIGH (ref 0.76–1.27)
GFR calc Af Amer: 34 mL/min/{1.73_m2} — ABNORMAL LOW (ref >59)
GFR calc non Af Amer: 30 mL/min/{1.73_m2} — ABNORMAL LOW (ref >59)
Glucose: 124 mg/dL — ABNORMAL HIGH (ref 65–99)
Potassium: 4.1 mmol/L (ref 3.5–5.2)
Sodium: 143 mmol/L (ref 134–144)

## 2019-05-22 ENCOUNTER — Other Ambulatory Visit: Payer: Medicare HMO

## 2019-05-23 ENCOUNTER — Ambulatory Visit: Payer: Medicare HMO | Admitting: Family

## 2019-05-29 ENCOUNTER — Other Ambulatory Visit: Payer: Self-pay

## 2019-05-29 ENCOUNTER — Ambulatory Visit (INDEPENDENT_AMBULATORY_CARE_PROVIDER_SITE_OTHER): Payer: Medicare HMO

## 2019-05-29 ENCOUNTER — Other Ambulatory Visit: Payer: Medicare HMO

## 2019-05-29 DIAGNOSIS — I10 Essential (primary) hypertension: Secondary | ICD-10-CM | POA: Diagnosis not present

## 2019-05-29 DIAGNOSIS — N184 Chronic kidney disease, stage 4 (severe): Secondary | ICD-10-CM | POA: Diagnosis not present

## 2019-05-30 NOTE — Progress Notes (Signed)
Follow-up Outpatient Visit Date: 05/31/2019  Primary Care Provider: Glean Hess, MD [address] [place] [address]  Chief Complaint: Shortness of breath and leg swelling  HPI:  Mr. Ybanez is a 79 y.o. male with history of coronary artery disease, persistent atrial fibrillation, hypertension, hyperlipidemia, type 2 diabetes mellitus, chronic kidney disease, GERD, and hyperparathyroidism, who presents for follow-up of HFpEF and atrial fibrillation.  I last saw him in early February, at which time he reported continued leg edema and shortness of breath, complicated by worsening renal function with escalation of diuresis.  We agreed to decrease furosemide and stop losartan.  Referral to nephrology was made.  1.5 weeks later, he was hospitalized at [facility] with acute on chronic HFpEF.  He was diuresed with IV furosemide, though this was complicated by worsening renal function.  He was discharged on torsemide 40 mg daily.  Renal artery Doppler yesterday did not show evidence of severe RAS, though study was limited by overlying bowel gas.  Today, [name] reports that his leg swelling has improved, though he still has significant edema.  He has also noticed some increasing erythema along both calves, worse on the left.  His weight at home is down about 5 pounds.  Chronic exertional dyspnea with minimal activity is unchanged.  He denies chest pain, palpitations, and lightheadedness.  He is scheduled for his initial visit with Dr. Holley Raring on 3/25.  He is scheduled to see Dr. Patsey Berthold in the pulmonary clinic in late April.  He remains on apixaban without significant bleeding.  He remains frustrated that he is unable to do activities that were routine for him up until his hospitalization almost a year ago.  He is scheduled for left eyelid procedure later today, as his eyelid has become  everted.  --------------------------------------------------------------------------------------------------  Cardiovascular History & Procedures: Cardiovascular Problems:  Coronary artery disease  HFpEF  Atrial fibrillation  Risk Factors:  Known CAD, hypertension, hyperlipidemia, diabetes mellitus, male gender, obesity, and age > 58  Cath/PCI:  None available.  CV Surgery:  None  EP Procedures and Devices:  None  Non-Invasive Evaluation(s):  Renal Doppler (05/29/2019): Limited study due to overlying bowel gas.  Normal kidney sizes without severe stenosis.  Resistive indices elevated in both kidneys.  TTE (05/08/2019): Normal LV size with mild LVH.  LVEF 55-60%.  Normal RV size and function with moderate pulmonary hypertension (RVSP 56 mmHg).  Mild to moderate MR was noted, as well as mild AI.  CVP was severely elevated.  TTE (07/07/2018): Nromal LV size with LVEF 60-65%. Unable to assess diastolic function. Mildly reduced RV function with mild RV dilation and thickening. Aortic sclerosis noted. Unable to assess aortic regurgitation.  Recent CV Pertinent Labs: Lab Results  Component Value Date   CHOL 137 07/22/2018   HDL 44 07/22/2018   LDLCALC 73 07/22/2018   TRIG 116 07/22/2018   BNP 165.0 (H) 05/07/2019   K 4.1 05/16/2019   BUN 39 (H) 05/16/2019   CREATININE 2.06 (H) 05/16/2019    Past medical and surgical history were reviewed and updated in EPIC.  Current Meds  Medication Sig  . amLODipine (NORVASC) 10 MG tablet Take 10 mg by mouth daily.  Marland Kitchen apixaban (ELIQUIS) 2.5 MG TABS tablet Take 1 tablet (2.5 mg total) by mouth 2 (two) times daily.  . Ascorbic Acid (VITAMIN C) 1000 MG tablet Take 1,000 mg by mouth daily.  . carboxymethylcellul-glycerin (LUBRICATING EYE DROPS) 0.5-0.9 % ophthalmic solution Place 1 drop into the  left eye as needed for dry eyes.  . cholecalciferol (VITAMIN D3) 25 MCG (1000 UT) tablet Take 2,000 Units by mouth daily.   . Flaxseed,  Linseed, (FLAX SEED OIL) 1000 MG CAPS Take by mouth.  Marland Kitchen glipiZIDE (GLUCOTROL XL) 10 MG 24 hr tablet Take 10 mg by mouth 2 (two) times daily.   . hydrALAZINE (APRESOLINE) 50 MG tablet Take 1 tablet (50 mg total) by mouth 3 (three) times daily.  . insulin NPH Human (NOVOLIN N) 100 UNIT/ML injection Inject 10-15 Units into the skin daily. Take 10-15 units twice a day after breakfast and before bedtime per Sliding Scale (BS>250: 15 units, BS= 200-250:10 units)  . MAXITROL 3.5-10000-0.1 OINT Place 1 application into the left eye 2 (two) times daily.   . Multiple Vitamins-Minerals (MULTIVITAMIN WITH MINERALS) tablet Take 1 tablet by mouth daily.  . nitroGLYCERIN (NITROLINGUAL) 0.4 MG/SPRAY spray Place 1 spray under the tongue as directed.  . Omega 3 1000 MG CAPS Take 1 capsule by mouth daily.  . rosuvastatin (CRESTOR) 5 MG tablet Take 1 tablet (5 mg total) by mouth 3 (three) times a week.  . silver sulfADIAZINE (SILVADENE) 1 % cream Apply 1 application topically 2 (two) times daily. To leg wounds  . torsemide (DEMADEX) 20 MG tablet Take 2 tablets (40 mg total) by mouth daily.    Allergies: Atorvastatin and Simvastatin  Social History   Tobacco Use  . Smoking status: Former Research scientist (life sciences)  . Smokeless tobacco: Never Used  Substance Use Topics  . Alcohol use: Yes    Alcohol/week: 2.0 standard drinks    Types: 2 Glasses of wine per week  . Drug use: Not Currently    Family History  Problem Relation Age of Onset  . Pancreatic cancer Mother   . CAD Father   . Diabetes Brother     Review of Systems: A 12-system review of systems was performed and was negative except as noted in the HPI.  --------------------------------------------------------------------------------------------------  Physical Exam: BP 140/60 (BP Location: Left Arm, Patient Position: Sitting, Cuff Size: Large)   Pulse 60   Ht 5\' 8"  (1.727 m)   Wt 270 lb 8 oz (122.7 kg)   SpO2 95%   BMI 41.13 kg/m   General: NAD.   Accompanied by his daughter. Neck: JVP approximately 8 cm with positive HJR. Lungs: Mildly diminished breath sounds throughout without wheezes or crackles. Heart: Distant heart sounds.  Regular rate and rhythm without murmurs.  Unable to assess PMI due to body habitus. Abdomen: Obese but soft.  No tenderness. Extremities: 2+ pretibial edema bilaterally. Skin: Erythema with scaling of the skin overlying both calves, worse on the left.  There is mild warmth associated with the erythema.  EKG: Low voltage with indeterminate narrow complex rhythm.  Nonspecific T wave abnormality.  Lab Results  Component Value Date   WBC 8.8 05/16/2019   HGB 12.1 (L) 05/16/2019   HCT 38.8 05/16/2019   MCV 88 05/16/2019   PLT 246 05/16/2019    Lab Results  Component Value Date   NA 143 05/16/2019   K 4.1 05/16/2019   CL 100 05/16/2019   CO2 27 05/16/2019   BUN 39 (H) 05/16/2019   CREATININE 2.06 (H) 05/16/2019   GLUCOSE 124 (H) 05/16/2019   ALT 13 03/22/2019    Lab Results  Component Value Date   CHOL 137 07/22/2018   HDL 44 07/22/2018   LDLCALC 73 07/22/2018   TRIG 116 07/22/2018    --------------------------------------------------------------------------------------------------  ASSESSMENT AND PLAN:  Chronic HFpEF: [name] still appears volume overloaded and his weight in the office today is up 7 pounds from his visit with Dr. Army Melia in late February.  [name] attributes this to clothing that he is currently wearing, and his weight at home is down 5 pounds from baseline at 265.  He still appears significantly volume overloaded.  I will check a BMP today and have asked him to continue taking torsemide 40 mg daily.  He is planning to skip today's dose because he is scheduled for eyelid procedure later today.  If his weight is up significantly tomorrow, he may need to temporarily increase torsemide to 60 mg daily.  Given his continued volume overload in the setting of preserved LVEF,  moderate pulmonary hypertension by echo, low voltage on EKG despite mild LVH noted on recent echo, and tenuous renal status, I am concerned that underlying restrictive cardiomyopathy, including infiltrative process such as amyloid, may be playing a role.  I will refer [name] to our advanced heart failure clinic in [facility] for further assessment.  Coronary artery disease: Details of prior diagnosis are uncertain.  I believe that his exertional dyspnea is driven primarily by his HFpEF with volume overload and not myocardial ischemia.  He denies angina.  We will defer ischemia evaluation at this time pending optimization of his heart failure.  Persistent atrial fibrillation: Underlying rhythm on today's EKG is difficult to discern due to low voltage.  He is not on any rate controlling agents.  We will plan to continue apixaban 2.5 mg twice daily.  Chronic kidney disease stage IV: Continue current medications; losartan on hold due to worsening renal insufficiency.  We will draw a basic metabolic panel today.  [name] is scheduled for consultation with nephrology later this month.  Though suboptimal, recent renal artery Doppler does not suggest severe bilateral renal artery stenosis to explain renal insufficiency.  I recommended deferring renovascular imaging testing at this time, as I think risks of gadolinium or iodinated contrast outweigh benefits.  Cellulitis: Bilateral calf erythema (left greater than right) is noted.  While this may reflect stasis dermatitis, I am concerned about the potential for developing cellulitis.  I have prescribed a 7-day course of cephalexin 500 mg three times daily.  Mr. Jiles Harold should follow-up with the wound center and his PCP.  Hypertension: Mildly elevated today.  Continue current medications and sodium restriction.  Morbid obesity: Weight loss encouraged through diet and exercise, though mobility is quite limited.  Follow-up: Return to clinic in 6  weeks.  Nelva Bush, MD 05/31/2019 12:39 PM

## 2019-05-31 ENCOUNTER — Encounter: Payer: Self-pay | Admitting: Internal Medicine

## 2019-05-31 ENCOUNTER — Other Ambulatory Visit: Payer: Self-pay

## 2019-05-31 ENCOUNTER — Ambulatory Visit (INDEPENDENT_AMBULATORY_CARE_PROVIDER_SITE_OTHER): Payer: Medicare HMO | Admitting: Internal Medicine

## 2019-05-31 VITALS — BP 140/60 | HR 60 | Ht 68.0 in | Wt 270.5 lb

## 2019-05-31 DIAGNOSIS — I4819 Other persistent atrial fibrillation: Secondary | ICD-10-CM | POA: Diagnosis not present

## 2019-05-31 DIAGNOSIS — I10 Essential (primary) hypertension: Secondary | ICD-10-CM

## 2019-05-31 DIAGNOSIS — N184 Chronic kidney disease, stage 4 (severe): Secondary | ICD-10-CM | POA: Diagnosis not present

## 2019-05-31 DIAGNOSIS — L03119 Cellulitis of unspecified part of limb: Secondary | ICD-10-CM

## 2019-05-31 DIAGNOSIS — I5032 Chronic diastolic (congestive) heart failure: Secondary | ICD-10-CM | POA: Diagnosis not present

## 2019-05-31 HISTORY — DX: Cellulitis of unspecified part of limb: L03.119

## 2019-05-31 MED ORDER — CEPHALEXIN 500 MG PO CAPS: 500.0000 mg | ORAL_CAPSULE | Freq: Three times a day (TID) | ORAL | 0 refills | Status: AC

## 2019-05-31 NOTE — Patient Instructions (Signed)
Medication Instructions:  Your physician has recommended you make the following change in your medication:  1- KEFLEX 500 mg by mouth three times a day for 7 days.   *If you need a refill on your cardiac medications before your next appointment, please call your pharmacy*  Lab Work: Your physician recommends that you return for lab work in: [place].  If you have labs (blood work) drawn today and your tests are completely normal, you will receive your results only by: Marland Kitchen MyChart Message (if you have MyChart) OR . A paper copy in the mail If you have any lab test that is abnormal or we need to change your treatment, we will call you to review the results.  Testing/Procedures: none  Follow-Up: You have been referred to the Advanced Heart Failure Clinic in [place]. Someone will call you to schedule. It may take 1-2 weeks before someone will call you. See next page for details of phone number and address.   At [facility], you and your health needs are our priority.  As part of our continuing mission to provide you with exceptional heart care, we have created designated Provider Care Teams.  These Care Teams include your primary Cardiologist (physician) and Advanced Practice Providers (APPs -  Physician Assistants and Nurse Practitioners) who all work together to provide you with the care you need, when you need it.  We recommend signing up for the patient portal called "MyChart".  Sign up information is provided on this After Visit Summary.  MyChart is used to connect with patients for Virtual Visits (Telemedicine).  Patients are able to view lab/test results, encounter notes, upcoming appointments, etc.  Non-urgent messages can be sent to your provider as well.   To learn more about what you can do with MyChart, go to [email or web address].    Your next appointment:   6 week(s)  The format for your next appointment:   In Person  Provider:    You may see DR Harrell Gave END or  one of the following Advanced Practice Providers on your designated Care Team:    Murray Hodgkins, NP  Christell Faith, PA-C  Marrianne Mood, PA-C

## 2019-06-01 ENCOUNTER — Ambulatory Visit: Payer: Medicare HMO | Admitting: Internal Medicine

## 2019-06-01 ENCOUNTER — Other Ambulatory Visit: Payer: Self-pay

## 2019-06-01 LAB — BASIC METABOLIC PANEL
BUN/Creatinine Ratio: 18 (ref 10–24)
BUN: 42 mg/dL — ABNORMAL HIGH (ref 8–27)
CO2: 24 mmol/L (ref 20–29)
Calcium: 9.3 mg/dL (ref 8.6–10.2)
Chloride: 98 mmol/L (ref 96–106)
Creatinine, Ser: 2.32 mg/dL — ABNORMAL HIGH (ref 0.76–1.27)
GFR calc Af Amer: 30 mL/min/{1.73_m2} — ABNORMAL LOW (ref >59)
GFR calc non Af Amer: 26 mL/min/{1.73_m2} — ABNORMAL LOW (ref >59)
Glucose: 218 mg/dL — ABNORMAL HIGH (ref 65–99)
Potassium: 4.4 mmol/L (ref 3.5–5.2)
Sodium: 140 mmol/L (ref 134–144)

## 2019-06-01 MED ORDER — TORSEMIDE 20 MG PO TABS: 40.0000 mg | ORAL_TABLET | Freq: Every day | ORAL | 0 refills | Status: DC

## 2019-06-15 ENCOUNTER — Institutional Professional Consult (permissible substitution): Payer: Medicare HMO | Admitting: Pulmonary Disease

## 2019-06-16 ENCOUNTER — Other Ambulatory Visit: Payer: Self-pay | Admitting: Nephrology

## 2019-06-16 DIAGNOSIS — E1122 Type 2 diabetes mellitus with diabetic chronic kidney disease: Secondary | ICD-10-CM

## 2019-06-16 DIAGNOSIS — N184 Chronic kidney disease, stage 4 (severe): Secondary | ICD-10-CM

## 2019-06-19 ENCOUNTER — Ambulatory Visit: Payer: Medicare HMO | Admitting: Family

## 2019-06-20 ENCOUNTER — Telehealth: Payer: Self-pay | Admitting: Internal Medicine

## 2019-06-20 ENCOUNTER — Telehealth: Payer: Self-pay

## 2019-06-20 NOTE — Telephone Encounter (Signed)
Patient friend [name] calling Would like to know if patient may get a referral for a lymphedema massage therapist - or if any recommendations Please call to discuss

## 2019-06-20 NOTE — Telephone Encounter (Signed)
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 She said they are not interested in seeing the therpist in [place] because the site has had several cases of Covid. Explained that I spoke with Dr. Army Melia and we don't do referrals to lymphedema therapist- that those would have to come from wound care.  She said she will not go to the location they are sending him to, and they did not offer any other locations. She said she is going to call Dr. Darnelle Bos office and see if they know fo anyone he can go to.  CM

## 2019-06-20 NOTE — Telephone Encounter (Signed)
Spoke with patient's friend.  Patient is seeing Dr Quay Burow with Wound care in [facility]. Dr Quay Burow says patient need lymphedema massage therapist and they refer to [facility] convalescence patient residence. She refuses to take the patient there because they had COVID outbreaks and she does not want to expose the patient to [place].  Advised I had talked to Dr End who recommended the consult should come from wound care doctor or vein and vascular as he is not the one who can manage lymphedema. [name] verbalized understanding of this but again reiterated that patient would not go to the recommended facility.  I searched [facility] website and found that [facility] at [facility] [phone]) may be able to help as well as the [place]. Both number provided. Advised that we could not make that referral because Dr End would not be able to write the orders and manage the lymphedema.  She verbalized understanding and realized what I was meaning. She will call these two places and then see if Dr Quay Burow can refer patient to one of these places for the treatment. Let her know to please call me back if there's any further issues and I will help in any way I can.

## 2019-06-26 ENCOUNTER — Ambulatory Visit
Admission: RE | Admit: 2019-06-26 | Discharge: 2019-06-26 | Disposition: A | Discharge status: Home / Self Care | Payer: Medicare HMO | Source: Ambulatory Visit | Attending: Nephrology | Admitting: Nephrology

## 2019-06-26 ENCOUNTER — Other Ambulatory Visit: Payer: Self-pay

## 2019-06-26 DIAGNOSIS — E1122 Type 2 diabetes mellitus with diabetic chronic kidney disease: Secondary | ICD-10-CM | POA: Diagnosis not present

## 2019-06-26 DIAGNOSIS — N184 Chronic kidney disease, stage 4 (severe): Secondary | ICD-10-CM | POA: Insufficient documentation

## 2019-07-03 ENCOUNTER — Other Ambulatory Visit: Payer: Self-pay

## 2019-07-03 MED ORDER — TORSEMIDE 20 MG PO TABS: 40.0000 mg | ORAL_TABLET | Freq: Every day | ORAL | 0 refills | Status: DC

## 2019-07-12 ENCOUNTER — Ambulatory Visit (INDEPENDENT_AMBULATORY_CARE_PROVIDER_SITE_OTHER): Payer: Medicare HMO | Admitting: Pulmonary Disease

## 2019-07-12 ENCOUNTER — Encounter: Payer: Self-pay | Admitting: Pulmonary Disease

## 2019-07-12 ENCOUNTER — Other Ambulatory Visit: Payer: Self-pay

## 2019-07-12 VITALS — BP 150/70 | HR 68 | Temp 98.3°F | Ht 68.0 in | Wt 274.6 lb

## 2019-07-12 DIAGNOSIS — I2729 Other secondary pulmonary hypertension: Secondary | ICD-10-CM | POA: Diagnosis not present

## 2019-07-12 DIAGNOSIS — N184 Chronic kidney disease, stage 4 (severe): Secondary | ICD-10-CM

## 2019-07-12 DIAGNOSIS — R0602 Shortness of breath: Secondary | ICD-10-CM

## 2019-07-12 DIAGNOSIS — J9611 Chronic respiratory failure with hypoxia: Secondary | ICD-10-CM

## 2019-07-12 DIAGNOSIS — I5032 Chronic diastolic (congestive) heart failure: Secondary | ICD-10-CM

## 2019-07-12 DIAGNOSIS — R601 Generalized edema: Secondary | ICD-10-CM

## 2019-07-12 MED ORDER — LEVALBUTEROL TARTRATE 45 MCG/ACT IN AERO: 2.0000 | INHALATION_SPRAY | Freq: Four times a day (QID) | RESPIRATORY_TRACT | 12 refills | Status: DC | PRN

## 2019-07-12 NOTE — Progress Notes (Signed)
Subjective:    Patient ID: Kyle Roberson, male    DOB: 1941/02/13, 79 y.o.   MRN: [id number]  HPI [name] is an 79 year old former smoker (quit 30 years ago) who presents for evaluation of shortness of breath.  He is kindly referred by Dr. Harrell Gave End.  The patient has a history of heart failure with preserved ejection fraction, coronary artery disease, persistent atrial fibrillation with controlled ventricular response, hypertension and chronic kidney disease stage IV.  Patient is followed by nephrology for his issues with CKD 4.  Unfortunately he continues to gain abdominal girth and lower extremity edema despite diuretics.  He is currently on torsemide.  The patient's significant other who presents with him today states that he gets "wheezing" in his neck area at nighttime when he tries to sleep.  He has had significant weight gain and swelling as noted above.  He has orthopnea so he has to sleep in a recliner.  No paroxysmal nocturnal dyspnea.  Though there is some minimal snoring there are no episodes of apnea noted.  He has not had any chest pain.  No cough or sputum production.  No hemoptysis.  He has never had any issues with bronchospasm prior to his cardiac/renal issues.  Has never had to use inhalers.  Significant other notes that when he "wheezes" at night on occasion, she will give him 1 puff of albuterol and that usually helps.   Past medical history, surgical history and family history have been reviewed.  They are as noted.  Social history he is a remote former smoker he smoked approximately 1 pack of cigarettes per week starting around high school and quit 30 years ago.  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  He has not had any significant occupational exposures.    Review of Systems A  10 point review of systems was performed and it is as noted above otherwise negative.    Objective:   Physical Exam BP (!) 150/70 (BP Location: Left Arm, Cuff Size: Large)   Pulse 68   Temp 98.3 F (36.8 C) (Temporal)   Ht 5\' 8"  (1.727 m)   Wt 274 lb 9.6 oz (124.6 kg)   SpO2 98% Comment: 2L  BMI 41.75 kg/m  GENERAL: Obese gentleman with significant truncal obesity, mildly tachypneic, presents in transport chair. HEAD: Normocephalic, atraumatic.  EYES: Pupils equal, round, reactive to light.  No scleral icterus.  Ectropion on the LEFT. MOUTH: Nose/mouth/throat not examined due to masking requirements for COVID 19.   NECK: Thick, supple. No thyromegaly. No nodules.  Trachea midline.  Cannot assess JVD well due to thickness of neck.  PULMONARY: He has crackles bibasilarly, no other adventitious sounds. CARDIOVASCULAR: S1 and S2.  Apparent regular rate and rhythm with extrasystoles.  Grade 2/6 to 3/6 mitral regurgitation murmur. GASTROINTESTINAL: Significant truncal obesity, soft, query ascites. MUSCULOSKELETAL: 2-3+ pitting edema lower extremities  NEUROLOGIC: Awake, alert, speech is fluent, no overt focal deficits. SKIN: Intact,warm,dry, limited exam no rashes.  He does have chronic stasis changes lower extremities. PSYCH: Mood and behavior normal  Recent Results (from the past 2160 hour(s))  Brain natriuretic peptide     Status: Abnormal   Collection Time: 04/26/19  4:29 PM  Result Value Ref Range   B Natriuretic Peptide 169.0 (  H) 0.0 - 100.0 pg/mL    Comment: Performed at [facility], [address] [place], [place] [address]  Basic metabolic panel     Status: Abnormal   Collection Time: 04/26/19  4:29 PM  Result Value Ref Range   Sodium 140 135 - 145 mmol/L   Potassium 4.2 3.5 - 5.1 mmol/L   Chloride 104 98 - 111 mmol/L   CO2 26 22 - 32 mmol/L   Glucose, Bld 206 (H) 70 - 99 mg/dL   BUN 40 (H) 8 - 23 mg/dL   Creatinine, Ser 2.21 (H) 0.61 - 1.24 mg/dL   Calcium  9.1 8.9 - 10.3 mg/dL   GFR calc non Af Amer 27 (L) >60 mL/min   GFR calc Af Amer 31 (L) >60 mL/min   Anion gap 10 5 - 15    Comment: Performed at [facility], [address] [place], [place] [address]  CBC with Differential     Status: Abnormal   Collection Time: 05/07/19  5:31 PM  Result Value Ref Range   WBC 8.9 4.0 - 10.5 K/uL   RBC 4.31 4.22 - 5.81 MIL/uL   Hemoglobin 12.1 (L) 13.0 - 17.0 g/dL   HCT 39.2 39.0 - 52.0 %   MCV 91.0 80.0 - 100.0 fL   MCH 28.1 26.0 - 34.0 pg   MCHC 30.9 30.0 - 36.0 g/dL   RDW 15.1 11.5 - 15.5 %   Platelets 235 150 - 400 K/uL   nRBC 0.0 0.0 - 0.2 %   Neutrophils Relative % 70 %   Neutro Abs 6.2 1.7 - 7.7 K/uL   Lymphocytes Relative 16 %   Lymphs Abs 1.4 0.7 - 4.0 K/uL   Monocytes Relative 9 %   Monocytes Absolute 0.8 0.1 - 1.0 K/uL   Eosinophils Relative 5 %   Eosinophils Absolute 0.4 0.0 - 0.5 K/uL   Basophils Relative 0 %   Basophils Absolute 0.0 0.0 - 0.1 K/uL   Immature Granulocytes 0 %   Abs Immature Granulocytes 0.03 0.00 - 0.07 K/uL    Comment: Performed at [facility], [address] [place], [place] [address]  Basic metabolic panel     Status: Abnormal   Collection Time: 05/07/19  5:31 PM  Result Value Ref Range   Sodium 137 135 - 145 mmol/L   Potassium 4.1 3.5 - 5.1 mmol/L   Chloride 104 98 - 111 mmol/L   CO2 23 22 - 32 mmol/L   Glucose, Bld 189 (H) 70 - 99 mg/dL   BUN 36 (H) 8 - 23 mg/dL   Creatinine, Ser 1.90 (H) 0.61 - 1.24 mg/dL   Calcium 9.0 8.9 - 10.3 mg/dL   GFR calc non Af Amer 33 (L) >60 mL/min   GFR calc Af Amer 38 (L) >60 mL/min   Anion gap 10 5 - 15    Comment: Performed at [facility], [address] [place], [place] [address]  Brain natriuretic peptide     Status: Abnormal   Collection Time: 05/07/19  5:31 PM  Result Value Ref Range   B Natriuretic Peptide 165.0 (H) 0.0 - 100.0 pg/mL    Comment: Performed at [facility], [address] [place], [place] [address]    Procalcitonin     Status: None   Collection Time: 05/07/19  5:31 PM  Result Value Ref Range   Procalcitonin <0.10 ng/mL    Comment:        Interpretation: PCT (Procalcitonin) <=  0.5 ng/mL: Systemic infection (sepsis) is not likely. Local bacterial infection is possible. (NOTE)       Sepsis PCT Algorithm           Lower Respiratory Tract                                      Infection PCT Algorithm    ----------------------------     ----------------------------         PCT < 0.25 ng/mL                PCT < 0.10 ng/mL         Strongly encourage             Strongly discourage   discontinuation of antibiotics    initiation of antibiotics    ----------------------------     -----------------------------       PCT 0.25 - 0.50 ng/mL            PCT 0.10 - 0.25 ng/mL               OR       >80% decrease in PCT            Discourage initiation of                                            antibiotics      Encourage discontinuation           of antibiotics    ----------------------------     -----------------------------         PCT >= 0.50 ng/mL              PCT 0.26 - 0.50 ng/mL               AND        <80% decrease in PCT             Encourage initiation of                                             antibiotics       Encourage continuation           of antibiotics    ----------------------------     -----------------------------        PCT >= 0.50 ng/mL                  PCT > 0.50 ng/mL               AND         increase in PCT                  Strongly encourage                                      initiation of antibiotics    Strongly encourage escalation           of antibiotics                                     -----------------------------  PCT <= 0.25 ng/mL                                                 OR                                        > 80% decrease in PCT                                     Discontinue / Do not  initiate                                             antibiotics Performed at [facility], [address] [place], [place] [address]   Troponin I (High Sensitivity)     Status: None   Collection Time: 05/07/19  5:31 PM  Result Value Ref Range   Troponin I (High Sensitivity) 13 <18 ng/L    Comment: (NOTE) Elevated high sensitivity troponin I (hsTnI) values and significant  changes across serial measurements may suggest ACS but many other  chronic and acute conditions are known to elevate hsTnI results.  Refer to the "Links" section for chest pain algorithms and additional  guidance. Performed at [facility], [address] [place], [place] [address]   Lactic acid, plasma     Status: None   Collection Time: 05/07/19  6:45 PM  Result Value Ref Range   Lactic Acid, Venous 0.8 0.5 - 1.9 mmol/L    Comment: Performed at [facility], [address] [place], [place] [address]  Blood Culture (routine x 2)     Status: None   Collection Time: 05/07/19  6:45 PM   Specimen: BLOOD  Result Value Ref Range   Specimen Description BLOOD R AC    Special Requests      BOTTLES DRAWN AEROBIC AND ANAEROBIC Blood Culture results may not be optimal due to an excessive volume of blood received in culture bottles   Culture      NO GROWTH 5 DAYS Performed at [facility], [address] [place], [place] [address]    Report Status 05/12/2019 FINAL   Blood Culture (routine x 2)     Status: None   Collection Time: 05/07/19  6:45 PM   Specimen: BLOOD  Result Value Ref Range   Specimen Description BLOOD R HAND    Special Requests      BOTTLES DRAWN AEROBIC AND ANAEROBIC Blood Culture results may not be optimal due to an inadequate volume of blood received in culture bottles   Culture      NO GROWTH 5 DAYS Performed at [facility], [address] [place], [place] [address]    Report Status 05/12/2019 FINAL   POC SARS Coronavirus 2 Ag     Status:  None   Collection Time: 05/07/19  7:36 PM  Result Value Ref Range   SARS Coronavirus 2 Ag NEGATIVE NEGATIVE    Comment: (NOTE) SARS-CoV-2 antigen NOT DETECTED.  Negative results are presumptive.  Negative results do not preclude SARS-CoV-2 infection  and should not be used as the sole basis for treatment or other patient management decisions, including infection  control decisions, particularly in the presence of clinical signs and  symptoms consistent with COVID-19, or in those who have been in contact with the virus.  Negative results must be combined with clinical observations, patient history, and epidemiological information. The expected result is Negative. Fact Sheet for Patients: PodPark.tn Fact Sheet for Healthcare Providers: GiftContent.is This test is not yet approved or cleared by the [place] FDA and  has been authorized for detection and/or diagnosis of SARS-CoV-2 by FDA under an Emergency Use Authorization (EUA).  This EUA will remain in effect (meaning this test can be used) for the duration of  the COVID-19 de claration under Section 564(b)(1) of the Act, 21 U.S.C. section 360bbb-3(b)(1), unless the authorization is terminated or revoked sooner.   Respiratory Panel by RT PCR (Flu A&B, Covid) - Nasopharyngeal Swab     Status: None   Collection Time: 05/07/19 11:58 PM   Specimen: Nasopharyngeal Swab  Result Value Ref Range   SARS Coronavirus 2 by RT PCR NEGATIVE NEGATIVE    Comment: (NOTE) SARS-CoV-2 target nucleic acids are NOT DETECTED. The SARS-CoV-2 RNA is generally detectable in upper respiratoy specimens during the acute phase of infection. The lowest concentration of SARS-CoV-2 viral copies this assay can detect is 131 copies/mL. A negative result does not preclude SARS-Cov-2 infection and should not be used as the sole basis for treatment or other patient management decisions. A negative result  may occur with  improper specimen collection/handling, submission of specimen other than nasopharyngeal swab, presence of viral mutation(s) within the areas targeted by this assay, and inadequate number of viral copies (<131 copies/mL). A negative result must be combined with clinical observations, patient history, and epidemiological information. The expected result is Negative. Fact Sheet for Patients:  PinkCheek.be Fact Sheet for Healthcare Providers:  GravelBags.it This test is not yet ap proved or cleared by the [place] FDA and  has been authorized for detection and/or diagnosis of SARS-CoV-2 by FDA under an Emergency Use Authorization (EUA). This EUA will remain  in effect (meaning this test can be used) for the duration of the COVID-19 declaration under Section 564(b)(1) of the Act, 21 U.S.C. section 360bbb-3(b)(1), unless the authorization is terminated or revoked sooner.    Influenza A by PCR NEGATIVE NEGATIVE   Influenza B by PCR NEGATIVE NEGATIVE    Comment: (NOTE) The Xpert Xpress SARS-CoV-2/FLU/RSV assay is intended as an aid in  the diagnosis of influenza from Nasopharyngeal swab specimens and  should not be used as a sole basis for treatment. Nasal washings and  aspirates are unacceptable for Xpert Xpress SARS-CoV-2/FLU/RSV  testing. Fact Sheet for Patients: PinkCheek.be Fact Sheet for Healthcare Providers: GravelBags.it This test is not yet approved or cleared by the [place] FDA and  has been authorized for detection and/or diagnosis of SARS-CoV-2 by  FDA under an Emergency Use Authorization (EUA). This EUA will remain  in effect (meaning this test can be used) for the duration of the  Covid-19 declaration under Section 564(b)(1) of the Act, 21  U.S.C. section 360bbb-3(b)(1), unless the authorization is  terminated or revoked. Performed at  [facility], [address] [place], [place] [address]   Glucose, capillary     Status: None   Collection Time: 05/08/19 12:25 AM  Result Value Ref Range   Glucose-Capillary 85 70 - 99 mg/dL  Troponin I (High Sensitivity)  Status: None   Collection Time: 05/08/19 12:51 AM  Result Value Ref Range   Troponin I (High Sensitivity) 12 <18 ng/L    Comment: (NOTE) Elevated high sensitivity troponin I (hsTnI) values and significant  changes across serial measurements may suggest ACS but many other  chronic and acute conditions are known to elevate hsTnI results.  Refer to the "Links" section for chest pain algorithms and additional  guidance. Performed at [facility], [address] [place], [place] [address]   Hemoglobin A1c     Status: Abnormal   Collection Time: 05/08/19 12:51 AM  Result Value Ref Range   Hgb A1c MFr Bld 8.7 (H) 4.8 - 5.6 %    Comment: (NOTE) Pre diabetes:          5.7%-6.4% Diabetes:              >6.4% Glycemic control for   <7.0% adults with diabetes    Mean Plasma Glucose 202.99 mg/dL    Comment: Performed at [facility] [address], [place], [place] [address]  Glucose, capillary     Status: Abnormal   Collection Time: 05/08/19  4:24 AM  Result Value Ref Range   Glucose-Capillary 144 (H) 70 - 99 mg/dL  Basic metabolic panel     Status: Abnormal   Collection Time: 05/08/19  5:17 AM  Result Value Ref Range   Sodium 139 135 - 145 mmol/L   Potassium 3.7 3.5 - 5.1 mmol/L   Chloride 105 98 - 111 mmol/L   CO2 26 22 - 32 mmol/L   Glucose, Bld 144 (H) 70 - 99 mg/dL   BUN 33 (H) 8 - 23 mg/dL   Creatinine, Ser 1.84 (H) 0.61 - 1.24 mg/dL   Calcium 8.9 8.9 - 10.3 mg/dL   GFR calc non Af Amer 34 (L) >60 mL/min   GFR calc Af Amer 39 (L) >60 mL/min   Anion gap 8 5 - 15    Comment: Performed at [facility], [address] [place], [place] [address]  Troponin I (High Sensitivity)     Status: None   Collection Time:  05/08/19  5:17 AM  Result Value Ref Range   Troponin I (High Sensitivity) 13 <18 ng/L    Comment: (NOTE) Elevated high sensitivity troponin I (hsTnI) values and significant  changes across serial measurements may suggest ACS but many other  chronic and acute conditions are known to elevate hsTnI results.  Refer to the "Links" section for chest pain algorithms and additional  guidance. Performed at [facility], [address] [place],  [address]   Glucose, capillary     Status: Abnormal   Collection Time: 05/08/19  8:32 AM  Result Value Ref Range   Glucose-Capillary 101 (H) 70 - 99 mg/dL  ECHOCARDIOGRAM COMPLETE     Status: None   Collection Time: 05/08/19 11:14 AM  Result Value Ref Range   Weight 4,321.6 oz   Height 68 in   BP 157/75 mmHg  Glucose, capillary     Status: Abnormal   Collection Time: 05/08/19 11:44 AM  Result Value Ref Range   Glucose-Capillary 116 (H) 70 - 99 mg/dL  Glucose, capillary     Status: Abnormal   Collection Time: 05/08/19  4:18 PM  Result Value Ref Range   Glucose-Capillary 135 (H) 70 - 99 mg/dL  Glucose, capillary     Status: Abnormal   Collection Time: 05/08/19  9:12 PM  Result Value Ref Range  Glucose-Capillary 129 (H) 70 - 99 mg/dL  Basic metabolic panel     Status: Abnormal   Collection Time: 05/09/19  6:50 AM  Result Value Ref Range   Sodium 142 135 - 145 mmol/L   Potassium 4.2 3.5 - 5.1 mmol/L   Chloride 104 98 - 111 mmol/L   CO2 26 22 - 32 mmol/L   Glucose, Bld 138 (H) 70 - 99 mg/dL   BUN 40 (H) 8 - 23 mg/dL   Creatinine, Ser 2.03 (H) 0.61 - 1.24 mg/dL   Calcium 9.3 8.9 - 10.3 mg/dL   GFR calc non Af Amer 30 (L) >60 mL/min   GFR calc Af Amer 35 (L) >60 mL/min   Anion gap 12 5 - 15    Comment: Performed at [facility], [address] [place], [place] [address]  Glucose, capillary     Status: Abnormal   Collection Time: 05/09/19  7:22 AM  Result Value Ref Range   Glucose-Capillary 139 (H) 70 - 99 mg/dL   Glucose, capillary     Status: Abnormal   Collection Time: 05/09/19 11:52 AM  Result Value Ref Range   Glucose-Capillary 276 (H) 70 - 99 mg/dL  Glucose, capillary     Status: Abnormal   Collection Time: 05/09/19  4:38 PM  Result Value Ref Range   Glucose-Capillary 128 (H) 70 - 99 mg/dL  CBC with Differential/Platelet     Status: Abnormal   Collection Time: 05/16/19  4:23 PM  Result Value Ref Range   WBC 8.8 3.4 - 10.8 x10E3/uL   RBC 4.39 4.14 - 5.80 x10E6/uL   Hemoglobin 12.1 (L) 13.0 - 17.7 g/dL   Hematocrit 38.8 37.5 - 51.0 %   MCV 88 79 - 97 fL   MCH 27.6 26.6 - 33.0 pg   MCHC 31.2 (L) 31.5 - 35.7 g/dL   RDW 14.5 11.6 - 15.4 %   Platelets 246 150 - 450 x10E3/uL   Neutrophils 68 Not Estab. %   Lymphs 17 Not Estab. %   Monocytes 9 Not Estab. %   Eos 5 Not Estab. %   Basos 1 Not Estab. %   Neutrophils Absolute 6.0 1.4 - 7.0 x10E3/uL   Lymphocytes Absolute 1.5 0.7 - 3.1 x10E3/uL   Monocytes Absolute 0.8 0.1 - 0.9 x10E3/uL   EOS (ABSOLUTE) 0.4 0.0 - 0.4 x10E3/uL   Basophils Absolute 0.0 0.0 - 0.2 x10E3/uL   Immature Granulocytes 0 Not Estab. %   Immature Grans (Abs) 0.0 0.0 - 0.1 [id number]  Basic metabolic panel     Status: Abnormal   Collection Time: 05/16/19  4:23 PM  Result Value Ref Range   Glucose 124 (H) 65 - 99 mg/dL   BUN 39 (H) 8 - 27 mg/dL   Creatinine, Ser 2.06 (H) 0.76 - 1.27 mg/dL   GFR calc non Af Amer 30 (L) >59 mL/min/1.73   GFR calc Af Amer 34 (L) >59 mL/min/1.73   BUN/Creatinine Ratio 19 10 - 24   Sodium 143 134 - 144 mmol/L   Potassium 4.1 3.5 - 5.2 mmol/L   Chloride 100 96 - 106 mmol/L   CO2 27 20 - 29 mmol/L   Calcium 9.5 8.6 - 10.2 mg/dL  Basic metabolic panel     Status: Abnormal   Collection Time: 05/31/19 10:30 AM  Result Value Ref Range   Glucose 218 (H) 65 - 99 mg/dL   BUN 42 (H) 8 - 27 mg/dL   Creatinine, Ser 2.32 (H) 0.76 -  1.27 mg/dL   GFR calc non Af Amer 26 (L) >59 mL/min/1.73   GFR calc Af Amer 30 (L) >59 mL/min/1.73    BUN/Creatinine Ratio 18 10 - 24   Sodium 140 134 - 144 mmol/L   Potassium 4.4 3.5 - 5.2 mmol/L   Chloride 98 96 - 106 mmol/L   CO2 24 20 - 29 mmol/L   Calcium 9.3 8.6 - 10.2 mg/dL   2D echocardiogram performed 08 May 2019, read by Dr. Fletcher Anon:  1. Left ventricular ejection fraction, by estimation, is 55 to 60%. The  left ventricle has normal function. The left ventricle has no regional  wall motion abnormalities. There is mild left ventricular hypertrophy.  Left ventricular diastolic parameters  are indeterminate.  2. Right ventricular systolic function is normal. The right ventricular  size is normal. There is moderately elevated pulmonary artery systolic  pressure. The estimated right ventricular systolic pressure is 66.5 mmHg.  3. Left atrial size was moderately dilated.  4. The mitral valve is normal in structure and function. Mild to moderate  mitral valve regurgitation. No evidence of mitral stenosis.  5. The aortic valve is normal in structure and function. Aortic valve  regurgitation is mild. Mild aortic valve sclerosis is present, with no  evidence of aortic valve stenosis.  6. Moderately dilated pulmonary artery.  7. The inferior vena cava is dilated in size with <50% respiratory  variability, suggesting right atrial pressure of 15 mmHg.   Laboratory data and echocardiogram results reviewed.  Patient shows worsening renal function.  Ambulatory oximetry: Patient on upon initial presentation showed oxygen saturations of 88%.,  Patient was placed on 2 L/min maintaining oxygen saturations 92 to 91%.  Patient was then ambulated on room air and very quickly dropped to 88% and had increased shortness of breath and could not continue.  He was placed on 2 L/min and recovered with saturations at 92% or better.     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  He is to contact us prior to that time should any new difficulties arise.  Renold Don, MD [facility]  *This note was dictated using voice recognition software/Dragon.  Despite best efforts to proofread, errors can occur which can change the meaning.  Any change was purely unintentional.

## 2019-07-12 NOTE — Patient Instructions (Addendum)
You are going to need oxygen at 2 L/min continuously  I have sent a prescription for Xopenex as needed 2 puffs every 6 hours.  May use it for wheezing and/or shortness of breath  We will see him in follow-up in 4 to 6 weeks time  If the diuretics stop working you may need dialysis but I am going to defer this decision to Dr. Holley Raring

## 2019-07-14 ENCOUNTER — Other Ambulatory Visit: Payer: Self-pay

## 2019-07-14 ENCOUNTER — Encounter: Payer: Self-pay | Admitting: Internal Medicine

## 2019-07-14 ENCOUNTER — Other Ambulatory Visit: Payer: Self-pay | Admitting: *Deleted

## 2019-07-14 ENCOUNTER — Telehealth: Payer: Self-pay | Admitting: *Deleted

## 2019-07-14 ENCOUNTER — Ambulatory Visit (INDEPENDENT_AMBULATORY_CARE_PROVIDER_SITE_OTHER): Payer: Medicare HMO | Admitting: Internal Medicine

## 2019-07-14 ENCOUNTER — Other Ambulatory Visit
Admission: RE | Admit: 2019-07-14 | Discharge: 2019-07-14 | Disposition: A | Discharge status: Home / Self Care | Payer: Medicare HMO | Source: Ambulatory Visit | Attending: Internal Medicine | Admitting: Internal Medicine

## 2019-07-14 VITALS — BP 142/56 | HR 63 | Ht 68.0 in | Wt 276.0 lb

## 2019-07-14 DIAGNOSIS — I2510 Atherosclerotic heart disease of native coronary artery without angina pectoris: Secondary | ICD-10-CM

## 2019-07-14 DIAGNOSIS — I4819 Other persistent atrial fibrillation: Secondary | ICD-10-CM

## 2019-07-14 DIAGNOSIS — I10 Essential (primary) hypertension: Secondary | ICD-10-CM

## 2019-07-14 DIAGNOSIS — Z79899 Other long term (current) drug therapy: Secondary | ICD-10-CM

## 2019-07-14 DIAGNOSIS — N184 Chronic kidney disease, stage 4 (severe): Secondary | ICD-10-CM

## 2019-07-14 DIAGNOSIS — I5033 Acute on chronic diastolic (congestive) heart failure: Secondary | ICD-10-CM | POA: Diagnosis not present

## 2019-07-14 DIAGNOSIS — I5032 Chronic diastolic (congestive) heart failure: Secondary | ICD-10-CM

## 2019-07-14 LAB — COMPREHENSIVE METABOLIC PANEL
ALT: 17 U/L (ref 0–44)
AST: 17 U/L (ref 15–41)
Albumin: 3.8 g/dL (ref 3.5–5.0)
Alkaline Phosphatase: 56 U/L (ref 38–126)
Anion gap: 10 (ref 5–15)
BUN: 52 mg/dL — ABNORMAL HIGH (ref 8–23)
CO2: 30 mmol/L (ref 22–32)
Calcium: 9.3 mg/dL (ref 8.9–10.3)
Chloride: 100 mmol/L (ref 98–111)
Creatinine, Ser: 2.52 mg/dL — ABNORMAL HIGH (ref 0.61–1.24)
GFR calc Af Amer: 27 mL/min — ABNORMAL LOW (ref >60)
GFR calc non Af Amer: 23 mL/min — ABNORMAL LOW (ref >60)
Glucose, Bld: 175 mg/dL — ABNORMAL HIGH (ref 70–99)
Potassium: 4.1 mmol/L (ref 3.5–5.1)
Sodium: 140 mmol/L (ref 135–145)
Total Bilirubin: 0.8 mg/dL (ref 0.3–1.2)
Total Protein: 7.2 g/dL (ref 6.5–8.1)

## 2019-07-14 LAB — CBC
HCT: 37.5 % — ABNORMAL LOW (ref 39.0–52.0)
Hemoglobin: 11.2 g/dL — ABNORMAL LOW (ref 13.0–17.0)
MCH: 26.6 pg (ref 26.0–34.0)
MCHC: 29.9 g/dL — ABNORMAL LOW (ref 30.0–36.0)
MCV: 89.1 fL (ref 80.0–100.0)
Platelets: 201 10*3/uL (ref 150–400)
RBC: 4.21 MIL/uL — ABNORMAL LOW (ref 4.22–5.81)
RDW: 15.8 % — ABNORMAL HIGH (ref 11.5–15.5)
WBC: 8.8 10*3/uL (ref 4.0–10.5)
nRBC: 0 % (ref 0.0–0.2)

## 2019-07-14 LAB — BRAIN NATRIURETIC PEPTIDE: B Natriuretic Peptide: 224 pg/mL — ABNORMAL HIGH (ref 0.0–100.0)

## 2019-07-14 MED ORDER — TORSEMIDE 20 MG PO TABS: 60.0000 mg | ORAL_TABLET | Freq: Two times a day (BID) | ORAL | 2 refills | Status: DC

## 2019-07-14 NOTE — Telephone Encounter (Signed)
Received incoming call from patient's family member, [name]. She is concerned about the patient's lab results especially the creatinine and GFR. She wants to make sure she does not need to take patient to the ER right now. She had several questions regarding:  Which ED should she go to? Will cardiology admit patient? Would patient see Advanced Heart Failure Clinic if he went to [facility] in [place]? Would patient go ahead and have right heart cath while in the hospital? Would it be better for him to go to [facility] in [place]?  Discussed with Dr End who advised his plan is still the same as explained during office visit today and in the lab work result note from today. If patient gets worse or they are uncomfortable with patient's current state, then they can proceed to either hospital emergency room at that time.   Explained to [facility]. She asked several times that patient would get the same treatment from either [place] or [facility] in [place]? Advised that [facility] providers are part of the same team of doctors and patient would receive care from our team accordingly. Made her aware that Hospitalists are the admitting team. She again said that patient would he get the right heart cath if they go to [facility] in [place]. Advised I could not answer the timeline of events as it would need to be determined as the plan of care unfolds from the internal medicine and/or cardiology inpatient team. She verbalized understanding and will follow Dr Darnelle Bos current plan of care as outlined in lab work result.  Her last question was how much water per day should patient drink? Will ask Dr End and call her back.

## 2019-07-14 NOTE — Progress Notes (Signed)
Follow-up Outpatient Visit Date: 07/14/2019  Primary Care Provider: Glean Hess, MD [address] [place] [address]  Chief Complaint: Swelling and shortness of breath  HPI:  Kyle Roberson is a 79 y.o. male with history of  coronary artery disease, persistent atrial fibrillation, hypertension, hyperlipidemia, type 2 diabetes mellitus, chronic kidney disease, GERD, and hyperparathyroidism, who presents for follow-up of chronic HFpEF and atrial fibrillation.  I last saw him on 05/31/2019, at which time his leg swelling had improved, though he still complained of significant lower extremity edema.  He also complained of increasing redness along both calves.  Chronic exertional dyspnea was unchanged.  We agreed to continue torsemide 40 mg daily.  He was referred to the advanced heart failure clinic in [facility] and is scheduled to see Dr. Haroldine Laws in early May.  He was seen last month by Dr. Holley Raring, who recommended renal ultrasound that showed bilateral cortical thinning but no hydronephrosis.  Prior renal artery Dopplers showed no clear evidence of renal artery stenosis.  He saw Dr. Patsey Berthold in the pulmonary clinic earlier this week.  She felt that his dyspnea was likely driven by diastolic heart failure.  Today, Kyle Roberson and his significant other report increasing leg edema and abdominal distention.  His weight has also continued to increase despite being compliant with torsemide 40 mg daily.  His urine output seems less brisk than it was when he was first started on this dose.  His orthopnea has also worsened to the point where he sometimes even needs to raise his head above 45 degrees to be comfortable.  He has exertional dyspnea with minimal activity but says that his breathing is okay when sitting still.  He denies chest pain, palpitations, lightheadedness and edema.  Dr. Patsey Berthold reportedly mentioned that Kyle Roberson may need admission for diuresis and RHC; his significant  other agrees with this.  However, Kyle Roberson is reluctant to be admitted today.  He remains on apixaban without bleeding.  --------------------------------------------------------------------------------------------------  Cardiovascular History & Procedures: Cardiovascular Problems:  Coronary artery disease  HFpEF  Atrial fibrillation  Risk Factors:  Known CAD, hypertension, hyperlipidemia, diabetes mellitus, male gender, obesity, and age > 8  Cath/PCI:  None available.  CV Surgery:  None  EP Procedures and Devices:  None  Non-Invasive Evaluation(s):  Renal Doppler (05/29/2019): Limited study due to overlying bowel gas.  Normal kidney sizes without severe stenosis.  Resistive indices elevated in both kidneys.  TTE (05/08/2019): Normal LV size with mild LVH.  LVEF 55-60%.  Normal RV size and function with moderate pulmonary hypertension (RVSP 56 mmHg).  Mild to moderate MR was noted, as well as mild AI.  CVP was severely elevated.  TTE (07/07/2018): Nromal LV size with LVEF 60-65%. Unable to assess diastolic function. Mildly reduced RV function with mild RV dilation and thickening. Aortic sclerosis noted. Unable to assess aortic regurgitation.  Recent CV Pertinent Labs: Lab Results  Component Value Date   CHOL 137 07/22/2018   HDL 44 07/22/2018   LDLCALC 73 07/22/2018   TRIG 116 07/22/2018   BNP 224.0 (H) 07/14/2019   K 4.1 07/14/2019   BUN 52 (H) 07/14/2019   BUN 42 (H) 05/31/2019   CREATININE 2.52 (H) 07/14/2019    Past medical and surgical history were reviewed and updated in EPIC.  Current Meds  Medication Sig  . amLODipine (NORVASC) 10 MG tablet Take 10 mg by mouth daily.  Marland Kitchen apixaban (ELIQUIS) 2.5 MG TABS tablet Take 1 tablet (2.5  mg total) by mouth 2 (two) times daily.  . Ascorbic Acid (VITAMIN C) 1000 MG tablet Take 1,000 mg by mouth daily.  . carboxymethylcellul-glycerin (LUBRICATING EYE DROPS) 0.5-0.9 % ophthalmic solution Place 1 drop into  the left eye as needed for dry eyes.  . cholecalciferol (VITAMIN D3) 25 MCG (1000 UT) tablet Take 2,000 Units by mouth daily.   . Flaxseed, Linseed, (FLAX SEED OIL) 1000 MG CAPS Take by mouth.  Marland Kitchen glipiZIDE (GLUCOTROL XL) 10 MG 24 hr tablet Take 10 mg by mouth 2 (two) times daily.   . hydrALAZINE (APRESOLINE) 50 MG tablet Take 1 tablet (50 mg total) by mouth 3 (three) times daily.  . insulin NPH Human (NOVOLIN N) 100 UNIT/ML injection Inject 10-15 Units into the skin daily. Take 10-15 units twice a day after breakfast and before bedtime per Sliding Scale (BS>250: 15 units, BS= 200-250:10 units)  . levalbuterol (XOPENEX HFA) 45 MCG/ACT inhaler Inhale 2 puffs into the lungs every 6 (six) hours as needed for wheezing or shortness of breath.  Marland Kitchen MAXITROL 3.5-10000-0.1 OINT Place 1 application into the left eye 2 (two) times daily.   . Multiple Vitamins-Minerals (MULTIVITAMIN WITH MINERALS) tablet Take 1 tablet by mouth daily.  . nitroGLYCERIN (NITROLINGUAL) 0.4 MG/SPRAY spray Place 1 spray under the tongue as directed.  . Omega 3 1000 MG CAPS Take 1 capsule by mouth daily.  . rosuvastatin (CRESTOR) 5 MG tablet Take 1 tablet (5 mg total) by mouth 3 (three) times a week.  . silver sulfADIAZINE (SILVADENE) 1 % cream Apply 1 application topically 2 (two) times daily. To leg wounds  . [DISCONTINUED] torsemide (DEMADEX) 20 MG tablet Take 2 tablets (40 mg total) by mouth daily.    Allergies: Atorvastatin and Simvastatin  Social History   Tobacco Use  . Smoking status: Former Research scientist (life sciences)  . Smokeless tobacco: Never Used  Substance Use Topics  . Alcohol use: Yes    Alcohol/week: 2.0 standard drinks    Types: 2 Glasses of wine per week  . Drug use: Not Currently    Family History  Problem Relation Age of Onset  . Pancreatic cancer Mother   . CAD Father   . Diabetes Brother     Review of Systems: A 12-system review of systems was performed and was negative except as noted in the  HPI.  --------------------------------------------------------------------------------------------------  Physical Exam: BP (!) 142/56 (BP Location: Left Arm, Patient Position: Sitting, Cuff Size: Normal)   Pulse 63   Ht 5\' 8"  (1.727 m)   Wt 276 lb (125.2 kg)   SpO2 93%   BMI 41.97 kg/m   General:  NAD. HEENT: No conjunctival pallor or scleral icterus. Facemask in place. Neck: JVP at least 8-10 cm with positive HJR. Lungs: Normal work of breathing. Coarse breath sounds with bibasilar cracles Heart: Regular rate and rhythm without murmurs, rubs, or gallops. Abd: Bowel sounds present. Firm but non-tender Ext: 2+ woody edema in both calves and thighs Skin: Bilateral distal calf erythema noted.  EKG:  Low voltage; suspect atrial fibrillation with junctional rhythm but cannot exclude sinus rhythm entirely.  Poor R wave progression.  Lab Results  Component Value Date   WBC 8.8 07/14/2019   HGB 11.2 (L) 07/14/2019   HCT 37.5 (L) 07/14/2019   MCV 89.1 07/14/2019   PLT 201 07/14/2019    Lab Results  Component Value Date   NA 140 07/14/2019   K 4.1 07/14/2019   CL 100 07/14/2019   CO2 30 07/14/2019  BUN 52 (H) 07/14/2019   CREATININE 2.52 (H) 07/14/2019   GLUCOSE 175 (H) 07/14/2019   ALT 17 07/14/2019    Lab Results  Component Value Date   CHOL 137 07/22/2018   HDL 44 07/22/2018   LDLCALC 73 07/22/2018   TRIG 116 07/22/2018    --------------------------------------------------------------------------------------------------  ASSESSMENT AND PLAN: Acute on chronic HFpEF: Kyle Roberson appears more volume overloaded than at our prior visit and has also gained 13 pounds in the last 2 months.  He reports NYHA class IV symptoms.  We discussed direct admission for more aggressive diuresis and possible right heart catheterization when his volume status has improved versus aggressive outpatient diuresis and close outpatient follow-up; Kyle Roberson prefers the latter option.  We  will check a CMP, CBC, and BNP today, with plans to increase torsemide to 60 mg BID.  He will return to see me in 5 days, with repeat BMP immediately before that visit.  I advised him that if his symptoms worsen in the meantime, he should proceed to the emergency department.  Advanced heart failure appointment with Dr. Haroldine Laws scheduled for 07/27/19.  Persistent atrial fibrillation: EKG today is again difficult to interpret in the setting of low voltage.  We will plan to continue apixaban 2.5 mg BID based on age and renal function.  He is not on any rate-controlling agents given history of bradycardia.  Coronary artery disease: No chest pain reported.  I suspect dyspnea is primarily driven by heart failure.  We will defer ischemia evaluation at this time.  Hypertension: BP suboptimally controlled today.  We will work on improving volume status, as above, which will hopefully help lower blood pressure as well.  Chronic kidney disease stage IV: Recheck CMP today in anticipation of escalating diuretics.  If renal function worsens in the face of increasing diuresis, patient may require admission for invasive hemodynamics to guide treatment strategy and even ultrafiltration.  Follow-up: Return to clinic on 07/19/2019.  Nelva Bush, MD 07/14/2019 7:29 PM

## 2019-07-14 NOTE — Patient Instructions (Addendum)
Medication Instructions:  Your physician has recommended you make the following change in your medication:  1- INCREASE Torsemide to 60 mg by mouth two times a day.  *If you need a refill on your cardiac medications before your next appointment, please call your pharmacy*   Lab Work: Your physician recommends that you return for lab work in: [place], CBC, BNP. At the [facility] as you leave the hospital today.  If you have labs (blood work) drawn today and your tests are completely normal, you will receive your results only by: Marland Kitchen MyChart Message (if you have MyChart) OR . A paper copy in the mail If you have any lab test that is abnormal or we need to change your treatment, we will call you to review the results.   Testing/Procedures: none   Follow-Up: At [facility], you and your health needs are our priority.  As part of our continuing mission to provide you with exceptional heart care, we have created designated Provider Care Teams.  These Care Teams include your primary Cardiologist (physician) and Advanced Practice Providers (APPs -  Physician Assistants and Nurse Practitioners) who all work together to provide you with the care you need, when you need it.  We recommend signing up for the patient portal called "MyChart".  Sign up information is provided on this After Visit Summary.  MyChart is used to connect with patients for Virtual Visits (Telemedicine).  Patients are able to view lab/test results, encounter notes, upcoming appointments, etc.  Non-urgent messages can be sent to your provider as well.   To learn more about what you can do with MyChart, go to [email or web address].    Your next appointment:   Next week on 07/19/19 at 11:40 am with Dr End   OK TO HAVE VISITOR  The format for your next appointment:   In Person  Provider:   Nelva Bush, MD

## 2019-07-14 NOTE — Telephone Encounter (Signed)
Dr End advised that patient should drink no more than 2L per day.  Attempted to reach [facility] but no answer and VM full. MyCHart message to sent to patient with recommendation.

## 2019-07-17 ENCOUNTER — Other Ambulatory Visit: Payer: Self-pay | Admitting: Internal Medicine

## 2019-07-17 MED ORDER — GLIPIZIDE ER 10 MG PO TB24: 10.0000 mg | ORAL_TABLET | Freq: Two times a day (BID) | ORAL | 2 refills | Status: DC

## 2019-07-17 NOTE — Telephone Encounter (Signed)
Medication Refill - Medication:  glipiZIDE (GLUCOTROL XL) 10 MG 24 hr tablet  Has the patient contacted their pharmacy?  Yes advised to call office as this is script from previous doctor. Pt needs medication asap as he runs out in the morning.   Preferred Pharmacy (with phone number or street name):  [facility], [place] [address] Phone:  [phone]  Fax:  [phone]       Agent: Please be advised that RX refills may take up to 3 business days. We ask that you follow-up with your pharmacy.

## 2019-07-17 NOTE — Telephone Encounter (Signed)
Requested medication (s) are due for refill today: yes  Requested medication (s) are on the active medication list: yes  Future visit scheduled: yes  Notes to clinic:  Patient is out of medication and needs asap Last filled by a historical provider Review for refill   Requested Prescriptions  Pending Prescriptions Disp Refills   glipiZIDE (GLUCOTROL XL) 10 MG 24 hr tablet 60 tablet 13    Sig: Take 1 tablet (10 mg total) by mouth 2 (two) times daily.      Endocrinology:  Diabetes - Sulfonylureas Failed - 07/17/2019  9:13 AM      Failed - HBA1C is between 0 and 7.9 and within 180 days    Hemoglobin A1C  Date Value Ref Range Status  07/22/2018 10.0  Final   Hgb A1c MFr Bld  Date Value Ref Range Status  05/08/2019 8.7 (H) 4.8 - 5.6 % Final    Comment:    (NOTE) Pre diabetes:          5.7%-6.4% Diabetes:              >6.4% Glycemic control for   <7.0% adults with diabetes           Passed - Valid encounter within last 6 months    Recent Outpatient Visits           2 months ago Chronic heart failure with preserved ejection fraction (HFpEF) [facility])   [facility] Glean Hess, MD   3 months ago Type II diabetes mellitus with complication [facility])   [facility] Glean Hess, MD   6 months ago Type II diabetes mellitus with complication [facility])   [facility] Glean Hess, MD   8 months ago Type II diabetes mellitus with complication [facility])   [facility] Glean Hess, MD   11 months ago Acute on chronic congestive heart failure, unspecified heart failure type [facility])   [place], Laura H, MD       Future Appointments             In 2 days Glean Hess, MD [facility], [place]   In 2 days End, Harrell Gave, MD [facility] [place]   In 1 month Tyler Pita, MD [place]

## 2019-07-19 ENCOUNTER — Inpatient Hospital Stay
Admission: AD | Admit: 2019-07-19 | Discharge: 2019-07-28 | DRG: 291 | Disposition: A | Discharge status: Home / Self Care | Payer: Medicare HMO | Source: Ambulatory Visit | Attending: Internal Medicine | Admitting: Internal Medicine

## 2019-07-19 ENCOUNTER — Other Ambulatory Visit
Admission: RE | Admit: 2019-07-19 | Discharge: 2019-07-19 | Disposition: A | Discharge status: Home / Self Care | Payer: Medicare HMO | Source: Ambulatory Visit | Attending: Internal Medicine | Admitting: Internal Medicine

## 2019-07-19 ENCOUNTER — Encounter: Payer: Self-pay | Admitting: Internal Medicine

## 2019-07-19 ENCOUNTER — Other Ambulatory Visit: Payer: Self-pay

## 2019-07-19 ENCOUNTER — Ambulatory Visit (INDEPENDENT_AMBULATORY_CARE_PROVIDER_SITE_OTHER): Payer: Medicare HMO | Admitting: Internal Medicine

## 2019-07-19 VITALS — BP 134/70 | HR 57 | Temp 97.4°F | Ht 68.0 in | Wt 275.0 lb

## 2019-07-19 VITALS — BP 126/60 | HR 60 | Ht 68.0 in | Wt 275.4 lb

## 2019-07-19 DIAGNOSIS — R601 Generalized edema: Secondary | ICD-10-CM

## 2019-07-19 DIAGNOSIS — Z888 Allergy status to other drugs, medicaments and biological substances status: Secondary | ICD-10-CM

## 2019-07-19 DIAGNOSIS — R768 Other specified abnormal immunological findings in serum: Secondary | ICD-10-CM | POA: Diagnosis present

## 2019-07-19 DIAGNOSIS — N2581 Secondary hyperparathyroidism of renal origin: Secondary | ICD-10-CM

## 2019-07-19 DIAGNOSIS — E118 Type 2 diabetes mellitus with unspecified complications: Secondary | ICD-10-CM

## 2019-07-19 DIAGNOSIS — E213 Hyperparathyroidism, unspecified: Secondary | ICD-10-CM | POA: Diagnosis present

## 2019-07-19 DIAGNOSIS — R0602 Shortness of breath: Secondary | ICD-10-CM | POA: Diagnosis present

## 2019-07-19 DIAGNOSIS — I5033 Acute on chronic diastolic (congestive) heart failure: Secondary | ICD-10-CM | POA: Diagnosis present

## 2019-07-19 DIAGNOSIS — Z87891 Personal history of nicotine dependence: Secondary | ICD-10-CM | POA: Diagnosis not present

## 2019-07-19 DIAGNOSIS — I480 Paroxysmal atrial fibrillation: Secondary | ICD-10-CM

## 2019-07-19 DIAGNOSIS — I2510 Atherosclerotic heart disease of native coronary artery without angina pectoris: Secondary | ICD-10-CM | POA: Diagnosis not present

## 2019-07-19 DIAGNOSIS — Z79899 Other long term (current) drug therapy: Secondary | ICD-10-CM

## 2019-07-19 DIAGNOSIS — I482 Chronic atrial fibrillation, unspecified: Secondary | ICD-10-CM | POA: Diagnosis not present

## 2019-07-19 DIAGNOSIS — R188 Other ascites: Secondary | ICD-10-CM | POA: Diagnosis present

## 2019-07-19 DIAGNOSIS — N184 Chronic kidney disease, stage 4 (severe): Secondary | ICD-10-CM

## 2019-07-19 DIAGNOSIS — Z20822 Contact with and (suspected) exposure to covid-19: Secondary | ICD-10-CM | POA: Diagnosis present

## 2019-07-19 DIAGNOSIS — Z833 Family history of diabetes mellitus: Secondary | ICD-10-CM

## 2019-07-19 DIAGNOSIS — Z6841 Body Mass Index (BMI) 40.0 and over, adult: Secondary | ICD-10-CM | POA: Diagnosis not present

## 2019-07-19 DIAGNOSIS — Z7901 Long term (current) use of anticoagulants: Secondary | ICD-10-CM | POA: Diagnosis not present

## 2019-07-19 DIAGNOSIS — E1122 Type 2 diabetes mellitus with diabetic chronic kidney disease: Secondary | ICD-10-CM | POA: Diagnosis present

## 2019-07-19 DIAGNOSIS — E7800 Pure hypercholesterolemia, unspecified: Secondary | ICD-10-CM | POA: Diagnosis not present

## 2019-07-19 DIAGNOSIS — E876 Hypokalemia: Secondary | ICD-10-CM | POA: Diagnosis not present

## 2019-07-19 DIAGNOSIS — K219 Gastro-esophageal reflux disease without esophagitis: Secondary | ICD-10-CM | POA: Diagnosis present

## 2019-07-19 DIAGNOSIS — I4819 Other persistent atrial fibrillation: Secondary | ICD-10-CM | POA: Diagnosis present

## 2019-07-19 DIAGNOSIS — Z955 Presence of coronary angioplasty implant and graft: Secondary | ICD-10-CM | POA: Diagnosis not present

## 2019-07-19 DIAGNOSIS — I272 Pulmonary hypertension, unspecified: Secondary | ICD-10-CM | POA: Diagnosis present

## 2019-07-19 DIAGNOSIS — R1900 Intra-abdominal and pelvic swelling, mass and lump, unspecified site: Secondary | ICD-10-CM

## 2019-07-19 DIAGNOSIS — Z8249 Family history of ischemic heart disease and other diseases of the circulatory system: Secondary | ICD-10-CM | POA: Diagnosis not present

## 2019-07-19 DIAGNOSIS — E1169 Type 2 diabetes mellitus with other specified complication: Secondary | ICD-10-CM

## 2019-07-19 DIAGNOSIS — E785 Hyperlipidemia, unspecified: Secondary | ICD-10-CM | POA: Diagnosis present

## 2019-07-19 DIAGNOSIS — Z794 Long term (current) use of insulin: Secondary | ICD-10-CM

## 2019-07-19 DIAGNOSIS — I10 Essential (primary) hypertension: Secondary | ICD-10-CM

## 2019-07-19 DIAGNOSIS — M7989 Other specified soft tissue disorders: Secondary | ICD-10-CM | POA: Diagnosis present

## 2019-07-19 DIAGNOSIS — N179 Acute kidney failure, unspecified: Secondary | ICD-10-CM | POA: Diagnosis present

## 2019-07-19 DIAGNOSIS — E782 Mixed hyperlipidemia: Secondary | ICD-10-CM | POA: Diagnosis not present

## 2019-07-19 DIAGNOSIS — I5032 Chronic diastolic (congestive) heart failure: Secondary | ICD-10-CM

## 2019-07-19 DIAGNOSIS — I25118 Atherosclerotic heart disease of native coronary 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: Secondary | ICD-10-CM | POA: Diagnosis present

## 2019-07-19 DIAGNOSIS — I130 Hypertensive heart and chronic kidney disease with heart failure and stage 1 through stage 4 chronic kidney disease, or unspecified chronic kidney disease: Secondary | ICD-10-CM | POA: Diagnosis present

## 2019-07-19 LAB — TROPONIN I (HIGH SENSITIVITY)
Troponin I (High Sensitivity): 17 ng/L (ref <18)
Troponin I (High Sensitivity): 17 ng/L (ref <18)

## 2019-07-19 LAB — BASIC METABOLIC PANEL
Anion gap: 11 (ref 5–15)
BUN: 56 mg/dL — ABNORMAL HIGH (ref 8–23)
CO2: 34 mmol/L — ABNORMAL HIGH (ref 22–32)
Calcium: 9.2 mg/dL (ref 8.9–10.3)
Chloride: 99 mmol/L (ref 98–111)
Creatinine, Ser: 2.58 mg/dL — ABNORMAL HIGH (ref 0.61–1.24)
GFR calc Af Amer: 26 mL/min — ABNORMAL LOW (ref >60)
GFR calc non Af Amer: 23 mL/min — ABNORMAL LOW (ref >60)
Glucose, Bld: 79 mg/dL (ref 70–99)
Potassium: 3.6 mmol/L (ref 3.5–5.1)
Sodium: 144 mmol/L (ref 135–145)

## 2019-07-19 LAB — GLUCOSE, CAPILLARY
Glucose-Capillary: 105 mg/dL — ABNORMAL HIGH (ref 70–99)
Glucose-Capillary: 150 mg/dL — ABNORMAL HIGH (ref 70–99)

## 2019-07-19 LAB — HEMOGLOBIN A1C
Hgb A1c MFr Bld: 7.2 % — ABNORMAL HIGH (ref 4.8–5.6)
Mean Plasma Glucose: 159.94 mg/dL

## 2019-07-19 MED ORDER — APIXABAN 2.5 MG PO TABS
2.5000 mg | ORAL_TABLET | Freq: Two times a day (BID) | ORAL | Status: DC
  Administered 2019-07-19: 2.5 mg via ORAL
  Filled 2019-07-19: qty 1

## 2019-07-19 MED ORDER — ASCORBIC ACID 500 MG PO TABS
1000.0000 mg | ORAL_TABLET | Freq: Every day | ORAL | Status: DC
  Administered 2019-07-20 – 2019-07-28 (×9): 1000 mg via ORAL
  Filled 2019-07-19 (×9): qty 2

## 2019-07-19 MED ORDER — ACETAMINOPHEN 325 MG PO TABS: 650.0000 mg | ORAL_TABLET | ORAL | Status: DC | PRN

## 2019-07-19 MED ORDER — ROSUVASTATIN CALCIUM 5 MG PO TABS
5.0000 mg | ORAL_TABLET | ORAL | Status: DC
  Administered 2019-07-19 – 2019-07-28 (×5): 5 mg via ORAL
  Filled 2019-07-19 (×5): qty 1

## 2019-07-19 MED ORDER — LEVALBUTEROL HCL 0.63 MG/3ML IN NEBU: 0.6300 mg | INHALATION_SOLUTION | Freq: Four times a day (QID) | RESPIRATORY_TRACT | Status: DC | PRN

## 2019-07-19 MED ORDER — INSULIN ASPART 100 UNIT/ML ~~LOC~~ SOLN
6.0000 [IU] | Freq: Three times a day (TID) | SUBCUTANEOUS | Status: DC
  Administered 2019-07-19 – 2019-07-28 (×24): 6 [IU] via SUBCUTANEOUS
  Filled 2019-07-19 (×23): qty 1

## 2019-07-19 MED ORDER — SODIUM CHLORIDE 0.9% FLUSH: 3.0000 mL | INTRAVENOUS | Status: DC | PRN

## 2019-07-19 MED ORDER — OMEGA-3-ACID ETHYL ESTERS 1 G PO CAPS
1.0000 g | ORAL_CAPSULE | Freq: Every day | ORAL | Status: DC
  Administered 2019-07-20 – 2019-07-28 (×9): 1 g via ORAL
  Filled 2019-07-19 (×9): qty 1

## 2019-07-19 MED ORDER — AMLODIPINE BESYLATE 10 MG PO TABS: 10.0000 mg | ORAL_TABLET | Freq: Every day | ORAL | 0 refills | Status: DC

## 2019-07-19 MED ORDER — NITROGLYCERIN 0.4 MG/SPRAY TL SOLN: 1.0000 | 1 refills | Status: DC

## 2019-07-19 MED ORDER — NITROGLYCERIN 0.4 MG SL SUBL: 0.4000 mg | SUBLINGUAL_TABLET | SUBLINGUAL | Status: DC | PRN

## 2019-07-19 MED ORDER — SODIUM CHLORIDE 0.9% FLUSH
3.0000 mL | Freq: Two times a day (BID) | INTRAVENOUS | Status: DC
  Administered 2019-07-19 – 2019-07-28 (×11): 3 mL via INTRAVENOUS

## 2019-07-19 MED ORDER — HYDRALAZINE HCL 50 MG PO TABS
50.0000 mg | ORAL_TABLET | Freq: Three times a day (TID) | ORAL | Status: DC
  Administered 2019-07-19 – 2019-07-23 (×11): 50 mg via ORAL
  Filled 2019-07-19 (×11): qty 1

## 2019-07-19 MED ORDER — ADULT MULTIVITAMIN W/MINERALS CH
1.0000 | ORAL_TABLET | Freq: Every day | ORAL | Status: DC
  Administered 2019-07-20 – 2019-07-28 (×9): 1 via ORAL
  Filled 2019-07-19 (×9): qty 1

## 2019-07-19 MED ORDER — APIXABAN 2.5 MG PO TABS: 2.5000 mg | ORAL_TABLET | Freq: Two times a day (BID) | ORAL | 1 refills | Status: DC

## 2019-07-19 MED ORDER — ONDANSETRON HCL 4 MG/2ML IJ SOLN: 4.0000 mg | Freq: Four times a day (QID) | INTRAMUSCULAR | Status: DC | PRN

## 2019-07-19 MED ORDER — SILVER SULFADIAZINE 1 % EX CREA
1.0000 "application " | TOPICAL_CREAM | Freq: Two times a day (BID) | CUTANEOUS | Status: DC
  Administered 2019-07-19 – 2019-07-28 (×17): 1 via TOPICAL
  Filled 2019-07-19 (×2): qty 85

## 2019-07-19 MED ORDER — SODIUM CHLORIDE 0.9 % IV SOLN: 250.0000 mL | INTRAVENOUS | Status: DC | PRN

## 2019-07-19 MED ORDER — OMEGA 3 1000 MG PO CAPS: 1.0000 | ORAL_CAPSULE | Freq: Every day | ORAL | Status: DC

## 2019-07-19 MED ORDER — NEOMYCIN-POLYMYXIN-DEXAMETH 0.1 % OP OINT
1.0000 "application " | TOPICAL_OINTMENT | Freq: Two times a day (BID) | OPHTHALMIC | Status: DC
  Administered 2019-07-20 – 2019-07-28 (×14): 1 via OPHTHALMIC
  Filled 2019-07-19 (×5): qty 3.5

## 2019-07-19 MED ORDER — LEVALBUTEROL TARTRATE 45 MCG/ACT IN AERO: 2.0000 | INHALATION_SPRAY | Freq: Four times a day (QID) | RESPIRATORY_TRACT | Status: DC | PRN

## 2019-07-19 MED ORDER — VITAMIN D 25 MCG (1000 UNIT) PO TABS
2000.0000 [IU] | ORAL_TABLET | Freq: Every day | ORAL | Status: DC
  Administered 2019-07-20 – 2019-07-28 (×9): 2000 [IU] via ORAL
  Filled 2019-07-19 (×9): qty 2

## 2019-07-19 MED ORDER — AMLODIPINE BESYLATE 10 MG PO TABS
10.0000 mg | ORAL_TABLET | Freq: Every day | ORAL | Status: DC
  Administered 2019-07-20: 10 mg via ORAL
  Filled 2019-07-19: qty 1

## 2019-07-19 MED ORDER — INSULIN ASPART 100 UNIT/ML ~~LOC~~ SOLN
0.0000 [IU] | Freq: Three times a day (TID) | SUBCUTANEOUS | Status: DC
  Administered 2019-07-20: 3 [IU] via SUBCUTANEOUS
  Administered 2019-07-20: 13:00:00 4 [IU] via SUBCUTANEOUS
  Administered 2019-07-20 – 2019-07-21 (×2): 3 [IU] via SUBCUTANEOUS
  Administered 2019-07-21 – 2019-07-22 (×3): 4 [IU] via SUBCUTANEOUS
  Administered 2019-07-23 (×2): 3 [IU] via SUBCUTANEOUS
  Administered 2019-07-24: 18:00:00 7 [IU] via SUBCUTANEOUS
  Administered 2019-07-24: 3 [IU] via SUBCUTANEOUS
  Administered 2019-07-24: 11 [IU] via SUBCUTANEOUS
  Administered 2019-07-25: 4 [IU] via SUBCUTANEOUS
  Administered 2019-07-25: 7 [IU] via SUBCUTANEOUS
  Administered 2019-07-25: 3 [IU] via SUBCUTANEOUS
  Administered 2019-07-26 – 2019-07-27 (×4): 4 [IU] via SUBCUTANEOUS
  Administered 2019-07-27: 7 [IU] via SUBCUTANEOUS
  Administered 2019-07-27 – 2019-07-28 (×2): 4 [IU] via SUBCUTANEOUS
  Filled 2019-07-19 (×21): qty 1

## 2019-07-19 MED ORDER — FUROSEMIDE 10 MG/ML IJ SOLN
80.0000 mg | Freq: Two times a day (BID) | INTRAMUSCULAR | Status: DC
  Administered 2019-07-19 – 2019-07-20 (×2): 80 mg via INTRAVENOUS
  Filled 2019-07-19 (×2): qty 8

## 2019-07-19 MED ORDER — GLIPIZIDE ER 10 MG PO TB24: 10.0000 mg | ORAL_TABLET | Freq: Two times a day (BID) | ORAL | 1 refills | Status: DC

## 2019-07-19 MED ORDER — NITROGLYCERIN 0.4 MG/SPRAY TL SOLN: 1.0000 | Status: DC

## 2019-07-19 NOTE — H&P (Signed)
History and Physical    [name] MCN:[id number] DOB: 19-Sep-1940 DOA: 07/19/2019  PCP: Glean Hess, MD   Patient coming from: Home  I have personally briefly reviewed patient's old medical records in [place]  Chief Complaint: Shortness of breath                                Leg swelling  HPI: Kyle Roberson is a 79 y.o. 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  Patient was seen by the cardiologist about 5 days prior to his admission with complaints of worsening lower extremity swelling, increased abdominal girth, weight gain and shortness of breath with mild exertion.  At that time patient was significantly volume overloaded but was reluctant to be admitted to the hospital, his diuretic dose was increased to torsemide 60 mg twice daily which he admits to taking as recommended. He went for his follow-up today and has not had any significant weight loss despite compliance with diuretic therapy.  He continues to have shortness of breath with minimal exertion and has been sleeping at 45 degrees due to orthopnea.  Bilateral lower extremity swelling persists. He denies having any chest pain, no palpitations, no nausea, no vomiting, no abdominal pain or any changes in his bowel habits.  He denies having any fever or chills.  ED Course: Patient was admitted directly to the hospital  Review of Systems: As per HPI otherwise 10 point review of systems negative.    Past Medical History:  Diagnosis Date  . AKI (acute kidney injury) ([place]) 07/06/2018  . CAD (coronary artery disease)   . Diabetes (Kyle Roberson)   . GERD (gastroesophageal reflux disease)   . HLD (hyperlipidemia)   . HTN (hypertension)     Past  Surgical History:  Procedure Laterality Date  . CORONARY STENT INTERVENTION    . CYSTOSCOPY    . HERNIA REPAIR       reports that he has quit smoking. He has never used smokeless tobacco. He reports current alcohol use of about 2.0 standard drinks of alcohol per week. He reports previous drug use.  Allergies  Allergen Reactions  . Atorvastatin Other (See Comments)  . Simvastatin Other (See Comments)    Family History  Problem Relation Age of Onset  . Pancreatic cancer Mother   . CAD Father   . Diabetes Brother      Prior to Admission medications   Medication Sig Start Date End Date Taking? Authorizing Provider  amLODipine (NORVASC) 10 MG tablet Take 1 tablet (10 mg total) by mouth daily. 07/19/19 09/04/20  Glean Hess, MD  apixaban (ELIQUIS) 2.5 MG TABS tablet Take 1 tablet (2.5 mg total) by mouth 2 (two) times daily. 07/19/19   Glean Hess, MD  Ascorbic Acid (VITAMIN C) 1000 MG tablet Take 1,000 mg by mouth daily.    [provider]  cholecalciferol (VITAMIN D3) 25 MCG (1000 UT) tablet Take 2,000 Units by mouth daily.     [provider]  Flaxseed, Linseed, (FLAX SEED OIL) 1000 MG CAPS Take by mouth.    [provider]  glipiZIDE (GLUCOTROL XL) 10 MG 24 hr tablet Take 1 tablet (10 mg total) by mouth 2 (two) times daily. 07/19/19  08/30/20  Glean Hess, MD  hydrALAZINE (APRESOLINE) 50 MG tablet Take 1 tablet (50 mg total) by mouth 3 (three) times daily. 05/05/19 08/03/19  End, Harrell Gave, MD  insulin NPH Human (NOVOLIN N) 100 UNIT/ML injection Inject 10-15 Units into the skin daily. Take 10-15 units twice a day after breakfast and before bedtime per Sliding Scale (BS>250: 15 units, BS= 200-250:10 units) 11/30/17   [provider]  levalbuterol Penne Lash HFA) 45 MCG/ACT inhaler Inhale 2 puffs into the lungs every 6 (six) hours as needed for wheezing or shortness of breath. 07/12/19   Tyler Pita, MD  MAXITROL 3.5-10000-0.1 OINT Place  1 application into the left eye 2 (two) times daily.  05/03/19   [provider]  Multiple Vitamins-Minerals (MULTIVITAMIN WITH MINERALS) tablet Take 1 tablet by mouth daily.    [provider]  nitroGLYCERIN (NITROLINGUAL) 0.4 MG/SPRAY spray Place 1 spray under the tongue as directed. 07/19/19 08/06/20  Glean Hess, MD  Omega 3 1000 MG CAPS Take 1 capsule by mouth daily. 04/14/06   [provider]  rosuvastatin (CRESTOR) 5 MG tablet Take 1 tablet (5 mg total) by mouth 3 (three) times a week. 01/11/19   Glean Hess, MD  silver sulfADIAZINE (SILVADENE) 1 % cream Apply 1 application topically 2 (two) times daily. To leg wounds 05/16/19 05/15/20  Glean Hess, MD  torsemide (DEMADEX) 20 MG tablet Take 60 mg by mouth 2 (two) times daily.     [provider]    Physical Exam: Vitals:   07/19/19 1309  BP: (!) 156/60  Pulse: 60  Resp: (!) 22  Temp: 97.7 F (36.5 C)  TempSrc: Oral  SpO2: 95%  Weight: 124.7 kg     Vitals:   07/19/19 1309  BP: (!) 156/60  Pulse: 60  Resp: (!) 22  Temp: 97.7 F (36.5 C)  TempSrc: Oral  SpO2: 95%  Weight: 124.7 kg    Constitutional: NAD, alert and oriented x 3.  Appears short of breath even at rest Eyes: PERRL, lids and conjunctivae normal, pale conjunctiva ENMT: Mucous membranes are moist.  Neck: normal, supple, no masses, no thyromegaly Respiratory: Decreased air entry at the bases bilaterally, scattered wheezing, no crackles. Normal respiratory effort. No accessory muscle use.  Cardiovascular: Irregularly irregular,no murmurs / rubs / gallops.2+ extremity edema. 2+ pedal pulses. No carotid bruits.  Abdomen: no tenderness, no masses palpated. No hepatosplenomegaly. Bowel sounds positive.  Increased abdominal girth Musculoskeletal: no clubbing / cyanosis. No joint deformity upper and lower extremities.  Skin: no rashes, lesions, ulcers.  Neurologic: No gross focal neurologic deficit. Psychiatric: Normal  mood and affect.   Labs on Admission: I have personally reviewed following labs and imaging studies  CBC: Recent Labs  Lab 07/14/19 1134  WBC 8.8  HGB 11.2*  HCT 37.5*  MCV 89.1  PLT 106   Basic Metabolic Panel: Recent Labs  Lab 07/14/19 1134 07/19/19 1132  NA 140 144  K 4.1 3.6  CL 100 99  CO2 30 34*  GLUCOSE 175* 79  BUN 52* 56*  CREATININE 2.52* 2.58*  CALCIUM 9.3 9.2   GFR: Estimated Creatinine Clearance: 29.4 mL/min (A) (by C-G formula based on SCr of 2.58 mg/dL (H)). Liver Function Tests: Recent Labs  Lab 07/14/19 1134  AST 17  ALT 17  ALKPHOS 56  BILITOT 0.8  PROT 7.2  ALBUMIN 3.8   No results for input(s): LIPASE, AMYLASE in the last 168 hours. No results for input(s): AMMONIA in  the last 168 hours. Coagulation Profile: No results for input(s): INR, PROTIME in the last 168 hours. Cardiac Enzymes: No results for input(s): CKTOTAL, CKMB, CKMBINDEX, TROPONINI in the last 168 hours. BNP (last 3 results) No results for input(s): PROBNP in the last 8760 hours. HbA1C: No results for input(s): HGBA1C in the last 72 hours. CBG: No results for input(s): GLUCAP in the last 168 hours. Lipid Profile: No results for input(s): CHOL, HDL, LDLCALC, TRIG, CHOLHDL, LDLDIRECT in the last 72 hours. Thyroid Function Tests: No results for input(s): TSH, T4TOTAL, FREET4, T3FREE, THYROIDAB in the last 72 hours. Anemia Panel: No results for input(s): VITAMINB12, FOLATE, FERRITIN, TIBC, IRON, RETICCTPCT in the last 72 hours. Urine analysis:    Component Value Date/Time   COLORURINE YELLOW (A) 01/02/2019 2131   APPEARANCEUR CLOUDY (A) 01/02/2019 2131   LABSPEC 1.013 01/02/2019 2131   PHURINE 5.0 01/02/2019 2131   GLUCOSEU >=500 (A) 01/02/2019 2131   HGBUR MODERATE (A) 01/02/2019 2131   BILIRUBINUR neg 01/10/2019 1628   KETONESUR NEGATIVE 01/02/2019 2131   PROTEINUR Positive (A) 01/10/2019 1628   PROTEINUR 30 (A) 01/02/2019 2131   UROBILINOGEN 0.2 01/10/2019 1628     NITRITE neg 01/10/2019 1628   NITRITE NEGATIVE 01/02/2019 2131   LEUKOCYTESUR Negative 01/10/2019 1628   LEUKOCYTESUR NEGATIVE 01/02/2019 2131    Radiological Exams on Admission: No results found.  EKG: Independently reviewed.  Atrial fibrillation  Assessment/Plan Principal Problem:   Acute on chronic diastolic CHF (congestive heart failure) (HCC) Active Problems:   Essential hypertension   Coronary artery disease involving native coronary artery of native heart without angina pectoris   Type 2 diabetes mellitus with stage 4 chronic kidney disease (HCC)   Persistent atrial fibrillation (HCC)   Morbid obesity (HCC)    Acute on chronic diastolic dysfunction CHF Patient presents for evaluation of shortness of breath with mild exertion associated with orthopnea and bilateral lower extremity swelling He has a 10 pound weight despite being compliant with his diuretic therapy We will start patient on Lasix 80 mg IV every 12 Optimize blood pressure control Maintain fluid restriction Consult cardiology Obtain daily weights   Persistent atrial fibrillation Patient is rate controlled Continue Eliquis as primary prophylaxis for an acute stroke   Diabetes mellitus with complications of stage IV chronic kidney disease Maintain consistent carbohydrate diet Sliding scale coverage with insulin We will request nephrology consult   Morbid obesity (BMI 21.3 kg/m2) Complicates overall prognosis and care   Hypertension Blood pressure is stable on amlodipine and hydralazine  DVT prophylaxis: Apixaban Code Status: Full code Family Communication: Greater than 50% of time was spent discussing patient's condition and plan of care with him at the bedside.  He names Nancy Marus as his healthcare power of attorney Disposition Plan: Back to previous home environment Consults called: Cardiology, Nephrology    Collier Bullock MD [facility]     07/19/2019, 4:23 PM

## 2019-07-19 NOTE — Patient Instructions (Signed)
You will be admitted to the hospital for IV diuresis.

## 2019-07-19 NOTE — Progress Notes (Signed)
Date:  07/19/2019   Name:  Kyle Roberson   DOB:  1940/09/09   MRN:  [id number]   Chief Complaint: Diabetes ([place]. )  Diabetes He presents for his follow-up diabetic visit. He has type 2 diabetes mellitus. His disease course has been improving. Pertinent negatives for hypoglycemia include no dizziness or headaches. Pertinent negatives for diabetes include no chest pain and no fatigue. Current diabetic treatment includes insulin injections (and glipizide). His home blood glucose trend is decreasing steadily.   CKD with hyperparathyroidism - seeing Nephrology now.  GFR down to 23.   CHF and volume overload - recently seen by Dr. Saunders Revel, working to adjust his diuretics and correct his overload.  Recent ECHO report below:  He will probably need to be hospitalized to get the situation under control. He now has home oxygen and can use it as needed.   FINDINGS  Left Ventricle: Left ventricular ejection fraction, by estimation, is 55  to 60%. The left ventricle has normal function. The left ventricle has no  regional wall motion abnormalities. The left ventricular internal cavity  size was normal in size. There is  mild left ventricular hypertrophy. Left ventricular diastolic parameters  are indeterminate.   Right Ventricle: The right ventricular size is normal. No increase in  right ventricular wall thickness. Right ventricular systolic function is  normal. There is moderately elevated pulmonary artery systolic pressure.  The tricuspid regurgitant velocity is  3.19 m/s, and with an assumed right atrial pressure of 15 mmHg, the  estimated right ventricular systolic pressure is 32.7 mmHg.   Left Atrium: Left atrial size was moderately dilated.   Right Atrium: Right atrial size was normal in size.   Pericardium: There is no evidence of pericardial effusion.   Mitral Valve: The mitral valve is normal in structure and function. Normal  mobility of the mitral valve leaflets. Mild to  moderate mitral valve  regurgitation. No evidence of mitral valve stenosis. MV peak gradient,  10.4 mmHg. The mean mitral valve  gradient is 4.0 mmHg.   Tricuspid Valve: The tricuspid valve is normal in structure. Tricuspid  valve regurgitation is mild . No evidence of tricuspid stenosis.   Aortic Valve: The aortic valve is normal in structure and function. Aortic  valve regurgitation is mild. Aortic regurgitation PHT measures 854 msec.  Mild aortic valve sclerosis is present, with no evidence of aortic valve  stenosis. Aortic valve mean  gradient measures 7.0 mmHg. Aortic valve peak gradient measures 11.6 mmHg.  Aortic valve area, by VTI measures 2.64 cm.   Pulmonic Valve: The pulmonic valve was normal in structure. Pulmonic valve  regurgitation is trivial. No evidence of pulmonic stenosis.   Aorta: The aortic root is normal in size and structure.   Pulmonary Artery: The pulmonary artery is moderately dilated.   Venous: The inferior vena cava is dilated in size with less than 50%  respiratory variability, suggesting right atrial pressure of 15 mmHg.   IAS/Shunts: No atrial level shunt detected by color flow Doppler.      Immunization History  Administered Date(s) Administered  . Fluad Quad(high Dose 65+) 04/07/2019  . Influenza,inj,Quad PF,6+ Mos 02/10/2016, 11/30/2017  . Pneumococcal Conjugate-13 11/12/2014  . Pneumococcal Polysaccharide-23 02/21/2007  . Tdap 08/06/2010      Lab Results  Component Value Date   CREATININE 2.52 (H) 07/14/2019   BUN 52 (H) 07/14/2019   NA 140 07/14/2019   K 4.1 07/14/2019   CL 100 07/14/2019   CO2  30 07/14/2019   Lab Results  Component Value Date   CHOL 137 07/22/2018   HDL 44 07/22/2018   LDLCALC 73 07/22/2018   TRIG 116 07/22/2018   Lab Results  Component Value Date   TSH 1.568 08/02/2018   Lab Results  Component Value Date   HGBA1C 8.7 (H) 05/08/2019   Lab Results  Component Value Date   WBC 8.8 07/14/2019   HGB  11.2 (L) 07/14/2019   HCT 37.5 (L) 07/14/2019   MCV 89.1 07/14/2019   PLT 201 07/14/2019   Lab Results  Component Value Date   ALT 17 07/14/2019   AST 17 07/14/2019   ALKPHOS 56 07/14/2019   BILITOT 0.8 07/14/2019     Review of Systems  Constitutional: Positive for unexpected weight change. Negative for chills, diaphoresis and fatigue.  Respiratory: Positive for shortness of breath. Negative for cough, chest tightness and wheezing.   Cardiovascular: Positive for leg swelling. Negative for chest pain and palpitations.  Gastrointestinal: Positive for constipation.  Neurological: Negative for dizziness, light-headedness and headaches.    Patient Active Problem List   Diagnosis Date Noted  . Cellulitis of lower extremity 05/31/2019  . Acute pulmonary edema (HCC)   . Distended abdomen   . SOB (shortness of breath) 05/07/2019  . Suspect Acute heart failure ([place]) 05/07/2019  . 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 CKD (chronic kidney disease) stage 4, GFR 15-29 ml/min (HCC) 04/27/2019  . Chronic heart failure with preserved ejection fraction (HFpEF) ([place]) 04/27/2019  . Morbid obesity ([place]) 04/27/2019  . Lower extremity edema 03/23/2019  . Erectile dysfunction 08/01/2018  . History of colonic diverticulitis 08/01/2018  . 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 Obesity (BMI 30-39.9) 08/01/2018  . Venous insufficiency 08/01/2018  . Persistent atrial fibrillation ([place]) 08/01/2018  . 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 Essential hypertension 07/06/2018  . Type II diabetes mellitus with complication ([place]) 19/62/2297  . Coronary artery disease involving native coronary artery of native heart without angina pectoris 06/05/2018  . 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[place]) 01/30/2017  . Vitamin D deficiency 01/30/2017  . Benign non-nodular prostatic hyperplasia with lower urinary tract symptoms 09/29/2014  . Chronic kidney disease, stage 3a 06/27/2014  . History of  MI (myocardial infarction) 06/27/2014  . Nephrolithiasis 01/04/2012    Allergies  Allergen Reactions  . Atorvastatin Other (See Comments)  . Simvastatin Other (See Comments)    Past Surgical History:  Procedure Laterality Date  . CORONARY STENT INTERVENTION    . CYSTOSCOPY    . HERNIA REPAIR      Social History   Tobacco Use  . Smoking status: Former Research scientist (life sciences)  . Smokeless tobacco: Never Used  Substance Use Topics  . Alcohol use: Yes    Alcohol/week: 2.0 standard drinks    Types: 2 Glasses of wine per week  . Drug use: Not Currently     Medication list has been reviewed and updated.  Current Meds  Medication Sig  . amLODipine (NORVASC) 10 MG tablet Take 1 tablet (10 mg total) by mouth daily.  Marland Kitchen apixaban (ELIQUIS) 2.5 MG TABS tablet Take 1 tablet (2.5 mg total) by mouth 2 (two) times daily.  . Ascorbic Acid (VITAMIN C) 1000 MG tablet Take 1,000 mg by mouth daily.  . cholecalciferol (VITAMIN D3) 25 MCG (1000 UT) tablet Take 2,000 Units by mouth daily.   . Flaxseed, Linseed, (FLAX SEED OIL) 1000 MG CAPS Take by mouth.  Marland Kitchen glipiZIDE (GLUCOTROL XL) 10 MG 24 hr tablet  Take 1 tablet (10 mg total) by mouth 2 (two) times daily.  . hydrALAZINE (APRESOLINE) 50 MG tablet Take 1 tablet (50 mg total) by mouth 3 (three) times daily.  . insulin NPH Human (NOVOLIN N) 100 UNIT/ML injection Inject 10-15 Units into the skin daily. Take 10-15 units twice a day after breakfast and before bedtime per Sliding Scale (BS>250: 15 units, BS= 200-250:10 units)  . levalbuterol (XOPENEX HFA) 45 MCG/ACT inhaler Inhale 2 puffs into the lungs every 6 (six) hours as needed for wheezing or shortness of breath.  . Multiple Vitamins-Minerals (MULTIVITAMIN WITH MINERALS) tablet Take 1 tablet by mouth daily.  . nitroGLYCERIN (NITROLINGUAL) 0.4 MG/SPRAY spray Place 1 spray under the tongue as directed.  . Omega 3 1000 MG CAPS Take 1 capsule by mouth daily.  . rosuvastatin (CRESTOR) 5 MG tablet Take 1 tablet (5 mg  total) by mouth 3 (three) times a week.  . silver sulfADIAZINE (SILVADENE) 1 % cream Apply 1 application topically 2 (two) times daily. To leg wounds  . [DISCONTINUED] amLODipine (NORVASC) 10 MG tablet Take 10 mg by mouth daily.  . [DISCONTINUED] apixaban (ELIQUIS) 2.5 MG TABS tablet Take 1 tablet (2.5 mg total) by mouth 2 (two) times daily.  . [DISCONTINUED] glipiZIDE (GLUCOTROL XL) 10 MG 24 hr tablet Take 1 tablet (10 mg total) by mouth 2 (two) times daily.  . [DISCONTINUED] nitroGLYCERIN (NITROLINGUAL) 0.4 MG/SPRAY spray Place 1 spray under the tongue as directed.    PHQ 2/9 Scores 07/19/2019 05/16/2019 04/07/2019 01/10/2019  PHQ - 2 Score 0 4 2 0  PHQ- 9 Score 2 8 4  -    BP Readings from Last 3 Encounters:  07/19/19 134/70  07/14/19 (!) 142/56  07/12/19 (!) 150/70    Physical Exam Vitals and nursing note reviewed.  Constitutional:      General: He is not in acute distress.    Appearance: He is well-developed. He is obese.  HENT:     Head: Normocephalic and atraumatic.  Cardiovascular:     Rate and Rhythm: Normal rate and regular rhythm.     Comments: Edema above the knees Pulmonary:     Effort: Pulmonary effort is normal. No respiratory distress.     Breath sounds: Examination of the right-lower field reveals rales. Examination of the left-lower field reveals rales. Rales present. No wheezing.  Abdominal:     General: Abdomen is protuberant.     Palpations: Abdomen is soft.  Musculoskeletal:        General: Normal range of motion.     Cervical back: Normal range of motion.     Right lower leg: 3+ Edema present.     Left lower leg: 3+ Edema present.  Skin:    General: Skin is warm and dry.     Findings: No rash.  Neurological:     Mental Status: He is alert and oriented to person, place, and time.  Psychiatric:        Attention and Perception: Attention normal.        Mood and Affect: Mood normal.        Behavior: Behavior normal.     Wt Readings from Last 3  Encounters:  07/19/19 275 lb (124.7 kg)  07/14/19 276 lb (125.2 kg)  07/12/19 274 lb 9.6 oz (124.6 kg)    BP 134/70   Pulse (!) 57   Temp (!) 97.4 F (36.3 C) (Temporal)   Ht 5\' 8"  (1.727 m)   Wt 275 lb (124.7 kg)  SpO2 (!) 89%   BMI 41.81 kg/m   Assessment and Plan: 1. Type II diabetes mellitus with complication (HCC) Blood sugars continuing to improve with oral and insulin therapy Using SS twice a day before meals, usually  10-15 units 90 day glucose average 170 now - glipiZIDE (GLUCOTROL XL) 10 MG 24 hr tablet; Take 1 tablet (10 mg total) by mouth 2 (two) times daily.  Dispense: 180 tablet; Refill: 1 - Hemoglobin A1c  2. CKD (chronic kidney disease) stage 4, GFR 15-29 ml/min [facility]) Being followed by Nephrology  3. 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 Paroxysmal atrial fibrillation (HCC) In SR today On DOAC therapy for stroke prevention  5. Chronic heart failure with preserved ejection fraction (HFpEF) (HCC) Worsening fluid retention and O2 saturation despite higher dose diuretics Clearing not have success with outpt therapy - seeing cardiology this afternoon and anticipate hospitalization.   Partially dictated using Editor, commissioning. Any errors are unintentional.  Halina Maidens, MD [facility]  07/19/2019

## 2019-07-19 NOTE — Progress Notes (Signed)
Patient arrived from doctors office via wheelchair. Patient stable no complaints of pain or distress . Patient oriented to unit and room. Call bell in reach bed in lowest position. Yellow socks on and fall risk bracelet intact.

## 2019-07-19 NOTE — Progress Notes (Signed)
Follow-up Outpatient Visit Date: 07/19/2019  Primary Care Provider: Glean Hess, MD [address] [place] [address]  Chief Complaint: Shortness of breath and swelling  HPI:  Mr. Fredericks is a 79 y.o. male with history of coronary artery disease, persistent atrial fibrillation, hypertension, hyperlipidemia, type 2 diabetes mellitus, chronic kidney disease, GERD, and hyperparathyroidism, who presents for follow-up of acute on chronic HFpEF.  I last saw him 5 days ago, which time [name] complained of increasing edema and shortness of breath.  He was significantly volume overloaded but was reluctant to be directly admitted to the hospital for inpatient management.  We agreed to escalate torsemide to 60 mg twice daily.  Today, Mr. Baehr reports that he is not much better.  He noticed some increase in urine output since increasing torsemide but has not experienced any improvement in his edema and weight.  He continues to have shortness of breath with minimal exertion that waxes and wanes in severity.  He is sleeping at 45 degrees but occasionally needs to sit upright due to orthopnea.  He has not had any chest pain, palpitations, lightheadedness, or bleeding.  --------------------------------------------------------------------------------------------------  Cardiovascular History & Procedures: Cardiovascular Problems:  Coronary artery disease  HFpEF  Atrial fibrillation  Risk Factors:  Known CAD, hypertension, hyperlipidemia, diabetes mellitus, male gender, obesity, and age > 20  Cath/PCI:  None available.  CV Surgery:  None  EP Procedures and Devices:  None  Non-Invasive Evaluation(s):  Renal Doppler (05/29/2019): Limited study due to overlying bowel gas. Normal kidney sizes without severe stenosis. Resistive indices elevated in both kidneys.  TTE (05/08/2019): Normal LV size with mild LVH. LVEF 55-60%. Normal RV size and function with  moderate pulmonary hypertension (RVSP 56 mmHg). Mild to moderate MR was noted, as well as mild AI. CVP was severely elevated.  TTE (07/07/2018): Nromal LV size with LVEF 60-65%. Unable to assess diastolic function. Mildly reduced RV function with mild RV dilation and thickening. Aortic sclerosis noted. Unable to assess aortic regurgitation.  Recent CV Pertinent Labs: Lab Results  Component Value Date   CHOL 137 07/22/2018   HDL 44 07/22/2018   LDLCALC 73 07/22/2018   TRIG 116 07/22/2018   BNP 224.0 (H) 07/14/2019   K 3.6 07/19/2019   BUN 56 (H) 07/19/2019   BUN 42 (H) 05/31/2019   CREATININE 2.58 (H) 07/19/2019    Past medical and surgical history were reviewed and updated in EPIC.  Current Meds  Medication Sig  . amLODipine (NORVASC) 10 MG tablet Take 1 tablet (10 mg total) by mouth daily.  Marland Kitchen apixaban (ELIQUIS) 2.5 MG TABS tablet Take 1 tablet (2.5 mg total) by mouth 2 (two) times daily.  . Ascorbic Acid (VITAMIN C) 1000 MG tablet Take 1,000 mg by mouth daily.  . cholecalciferol (VITAMIN D3) 25 MCG (1000 UT) tablet Take 2,000 Units by mouth daily.   . Flaxseed, Linseed, (FLAX SEED OIL) 1000 MG CAPS Take by mouth.  Marland Kitchen glipiZIDE (GLUCOTROL XL) 10 MG 24 hr tablet Take 1 tablet (10 mg total) by mouth 2 (two) times daily.  . hydrALAZINE (APRESOLINE) 50 MG tablet Take 1 tablet (50 mg total) by mouth 3 (three) times daily.  . insulin NPH Human (NOVOLIN N) 100 UNIT/ML injection Inject 10-15 Units into the skin daily. Take 10-15 units twice a day after breakfast and before bedtime per Sliding Scale (BS>250: 15 units, BS= 200-250:10 units)  . levalbuterol (XOPENEX HFA) 45 MCG/ACT inhaler Inhale 2 puffs into the lungs  every 6 (six) hours as needed for wheezing or shortness of breath.  Marland Kitchen MAXITROL 3.5-10000-0.1 OINT Place 1 application into the left eye 2 (two) times daily.   . Multiple Vitamins-Minerals (MULTIVITAMIN WITH MINERALS) tablet Take 1 tablet by mouth daily.  . nitroGLYCERIN  (NITROLINGUAL) 0.4 MG/SPRAY spray Place 1 spray under the tongue as directed.  . Omega 3 1000 MG CAPS Take 1 capsule by mouth daily.  . rosuvastatin (CRESTOR) 5 MG tablet Take 1 tablet (5 mg total) by mouth 3 (three) times a week.  . silver sulfADIAZINE (SILVADENE) 1 % cream Apply 1 application topically 2 (two) times daily. To leg wounds  . torsemide (DEMADEX) 20 MG tablet Take 60 mg by mouth 2 (two) times daily.     Allergies: Atorvastatin and Simvastatin  Social History   Tobacco Use  . Smoking status: Former Research scientist (life sciences)  . Smokeless tobacco: Never Used  Substance Use Topics  . Alcohol use: Yes    Alcohol/week: 2.0 standard drinks    Types: 2 Glasses of wine per week  . Drug use: Not Currently    Family History  Problem Relation Age of Onset  . Pancreatic cancer Mother   . CAD Father   . Diabetes Brother     --------------------------------------------------------------------------------------------------  Physical Exam: BP 126/60 (BP Location: Left Arm, Patient Position: Sitting, Cuff Size: Large)   Pulse 60   Ht 5\' 8"  (1.727 m)   Wt 275 lb 6 oz (124.9 kg)   SpO2 92%   BMI 41.87 kg/m   General: NAD.  Accompanied by his significant other. Neck: JVP approximately 8 to 10 cm, though evaluation is limited by body habitus. Lungs: Bibasilar crackles with fair air movement. Heart: Distant heart sounds.  Regular without murmurs. Abdomen: Firm and distended. Extremities: 3+ chronic edema in both calves and thighs.  EKG: Question junctional rhythm versus atrial fibrillation with ventricular rate of 60 bpm.  Poor R wave progression.  Lab Results  Component Value Date   WBC 8.8 07/14/2019   HGB 11.2 (L) 07/14/2019   HCT 37.5 (L) 07/14/2019   MCV 89.1 07/14/2019   PLT 201 07/14/2019    Lab Results  Component Value Date   NA 144 07/19/2019   K 3.6 07/19/2019   CL 99 07/19/2019   CO2 34 (H) 07/19/2019   BUN 56 (H) 07/19/2019   CREATININE 2.58 (H) 07/19/2019   GLUCOSE  79 07/19/2019   ALT 17 07/14/2019    Lab Results  Component Value Date   CHOL 137 07/22/2018   HDL 44 07/22/2018   LDLCALC 73 07/22/2018   TRIG 116 07/22/2018    --------------------------------------------------------------------------------------------------  ASSESSMENT AND PLAN: Acute on chronic HFpEF: Unfortunately, Mr. Dauphin is still massively volume overloaded and has not had any significant improvement in his swelling or weight despite increased dose of torsemide 60 mg twice daily 5 days ago.  Renal function is stable.  I believe that Mr. Leider has failed outpatient management and will require hospitalization with IV diuresis.  We will plan to directly admit him to to the hospitalist service at [facility] (discussed with Dr. Francine Graven), with [facility] continuing to follow in a consultative role.  I recommend transitioning him to furosemide 80 mg IV twice daily with a goal net negative fluid balance of at least 2 L in 24 hours.  Escalation to furosemide infusion +/- augmentation with metolazone may be necessary if urine output is inadequate.  Renal function will need to be closely monitored.  Once he has  achieved a euvolemic state, it may be worthwhile to perform a right heart catheterization to more objectively characterize his filling pressures and cardiac output.  Chronic kidney disease stage IV: Creatinine relatively stable despite escalation of torsemide.  We will escalate diuresis, as above.  If renal function worsens, it may be worthwhile to involve nephrology during this hospitalization in case ultrafiltration is necessary.  Persistent atrial fibrillation: EKG today does not show clear P waves.  It is hard to know if this represents atrial fibrillation versus a junctional rhythm.  Nonetheless, the patient has been on apixaban.  This will need to be held in anticipation of possible catheterization; IV heparin should be started when Mr. Charlot would be due for his next dose of  apixaban.  Coronary artery disease: No chest pain reported.  EKG with poor R wave progression but no acute changes.  Continue secondary prevention.  No plans for ischemia evaluation at this time.  Follow-up: To be determined based on hospital course.  Nelva Bush, MD 07/19/2019 12:12 PM

## 2019-07-20 DIAGNOSIS — I482 Chronic atrial fibrillation, unspecified: Secondary | ICD-10-CM

## 2019-07-20 DIAGNOSIS — I25118 Atherosclerotic heart disease of native coronary 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: Secondary | ICD-10-CM

## 2019-07-20 DIAGNOSIS — E1122 Type 2 diabetes mellitus with diabetic chronic kidney disease: Secondary | ICD-10-CM

## 2019-07-20 DIAGNOSIS — E785 Hyperlipidemia, unspecified: Secondary | ICD-10-CM

## 2019-07-20 DIAGNOSIS — I5033 Acute on chronic diastolic (congestive) heart failure: Secondary | ICD-10-CM

## 2019-07-20 DIAGNOSIS — R601 Generalized edema: Secondary | ICD-10-CM

## 2019-07-20 DIAGNOSIS — I4819 Other persistent atrial fibrillation: Secondary | ICD-10-CM | POA: Diagnosis not present

## 2019-07-20 DIAGNOSIS — N184 Chronic kidney disease, stage 4 (severe): Secondary | ICD-10-CM

## 2019-07-20 DIAGNOSIS — E1169 Type 2 diabetes mellitus with other specified complication: Secondary | ICD-10-CM

## 2019-07-20 LAB — BASIC METABOLIC PANEL
Anion gap: 11 (ref 5–15)
BUN: 57 mg/dL — ABNORMAL HIGH (ref 8–23)
CO2: 31 mmol/L (ref 22–32)
Calcium: 8.9 mg/dL (ref 8.9–10.3)
Chloride: 100 mmol/L (ref 98–111)
Creatinine, Ser: 2.5 mg/dL — ABNORMAL HIGH (ref 0.61–1.24)
GFR calc Af Amer: 27 mL/min — ABNORMAL LOW (ref >60)
GFR calc non Af Amer: 23 mL/min — ABNORMAL LOW (ref >60)
Glucose, Bld: 173 mg/dL — ABNORMAL HIGH (ref 70–99)
Potassium: 3.5 mmol/L (ref 3.5–5.1)
Sodium: 142 mmol/L (ref 135–145)

## 2019-07-20 LAB — GLUCOSE, CAPILLARY
Glucose-Capillary: 149 mg/dL — ABNORMAL HIGH (ref 70–99)
Glucose-Capillary: 161 mg/dL — ABNORMAL HIGH (ref 70–99)
Glucose-Capillary: 126 mg/dL — ABNORMAL HIGH (ref 70–99)

## 2019-07-20 LAB — HEMOGLOBIN A1C
Est. average glucose Bld gHb Est-mCnc: 160 mg/dL
Hgb A1c MFr Bld: 7.2 % — ABNORMAL HIGH (ref 4.8–5.6)

## 2019-07-20 MED ORDER — POTASSIUM CHLORIDE CRYS ER 20 MEQ PO TBCR
40.0000 meq | EXTENDED_RELEASE_TABLET | Freq: Once | ORAL | Status: AC
  Administered 2019-07-20: 13:00:00 40 meq via ORAL
  Filled 2019-07-20: qty 2

## 2019-07-20 MED ORDER — SPIRONOLACTONE 25 MG PO TABS
25.0000 mg | ORAL_TABLET | Freq: Every day | ORAL | Status: DC
  Administered 2019-07-20 – 2019-07-28 (×9): 25 mg via ORAL
  Filled 2019-07-20 (×9): qty 1

## 2019-07-20 MED ORDER — APIXABAN 2.5 MG PO TABS
2.5000 mg | ORAL_TABLET | Freq: Two times a day (BID) | ORAL | Status: DC
  Administered 2019-07-20 – 2019-07-28 (×17): 2.5 mg via ORAL
  Filled 2019-07-20 (×17): qty 1

## 2019-07-20 MED ORDER — FUROSEMIDE 10 MG/ML IJ SOLN
10.0000 mg/h | INTRAVENOUS | Status: AC
  Administered 2019-07-20: 4 mg/h via INTRAVENOUS
  Filled 2019-07-20: qty 25

## 2019-07-20 NOTE — Progress Notes (Signed)
Patient ID: Kyle Roberson, male   DOB: [birth year], 79 y.o.   MRN: [id number] [facility] PROGRESS NOTE  Cardin Nitschke GYF:[id number] DOB: [birth year] DOA: 07/19/2019 PCP: Glean Hess, MD  HPI/Subjective: Patient states that he has gained quite a bit of weight.  Normally he weighs around 245 to 50 pounds.  Today he weighed 271 pounds.  He noticed his legs started getting swollen and he could not button his pants.  Couple months ago he was switched over to torsemide from the furosemide.  Sometimes has shortness of breath.  Objective: Vitals:   07/20/19 0725 07/20/19 1141  BP: (!) 164/62 135/72  Pulse: (!) 54 62  Resp: 18 18  Temp: 98.4 F (36.9 C) 98 F (36.7 C)  SpO2: 92% 91%    Intake/Output Summary (Last 24 hours) at 07/20/2019 1428 Last data filed at 07/20/2019 1407 Gross per 24 hour  Intake 560 ml  Output 1750 ml  Net -1190 ml   Filed Weights   07/19/19 1309 07/20/19 0853  Weight: 124.7 kg 123.2 kg    ROS: Review of Systems  Constitutional: Negative for fever.  Eyes: Negative for blurred vision.  Respiratory: Positive for shortness of breath. Negative for cough.   Cardiovascular: Negative for chest pain.  Gastrointestinal: Negative for abdominal pain, constipation, diarrhea, nausea and vomiting.  Genitourinary: Negative for dysuria.  Musculoskeletal: Negative for joint pain.  Neurological: Negative for headaches.   Exam: Physical Exam  Constitutional: He is oriented to person, place, and time.  HENT:  Nose: No mucosal edema.  Mouth/Throat: No oropharyngeal exudate or posterior oropharyngeal edema.  Eyes: Conjunctivae and lids are normal.  Neck: Carotid bruit is not present.  Cardiovascular: S1 normal and S2 normal. Exam reveals no gallop.  No murmur heard. Respiratory: No respiratory distress. He has decreased breath sounds in the right lower field and the left lower field. He has no wheezes. He has no rhonchi. He has rales in the right lower  field and the left lower field.  GI: Soft. Bowel sounds are normal. He exhibits distension. There is no abdominal tenderness.  Musculoskeletal:     Right knee: Swelling present.     Left knee: Swelling present.     Right ankle: Swelling present.     Left ankle: Swelling present.  Lymphadenopathy:    He has no cervical adenopathy.  Neurological: He is alert and oriented to person, place, and time. No cranial nerve deficit.  Skin: Skin is warm. Nails show no clubbing.  Chronic lower extremity discoloration  Psychiatric: He has a normal mood and affect.      Data Reviewed: Basic Metabolic Panel: Recent Labs  Lab 07/14/19 1134 07/19/19 1132 07/20/19 0518  NA 140 144 142  K 4.1 3.6 3.5  CL 100 99 100  CO2 30 34* 31  GLUCOSE 175* 79 173*  BUN 52* 56* 57*  CREATININE 2.52* 2.58* 2.50*  CALCIUM 9.3 9.2 8.9   Liver Function Tests: Recent Labs  Lab 07/14/19 1134  AST 17  ALT 17  ALKPHOS 56  BILITOT 0.8  PROT 7.2  ALBUMIN 3.8   CBC: Recent Labs  Lab 07/14/19 1134  WBC 8.8  HGB 11.2*  HCT 37.5*  MCV 89.1  PLT 201   BNP (last 3 results) Recent Labs    04/26/19 1629 05/07/19 1731 07/14/19 1134  BNP 169.0* 165.0* 224.0*    CBG: Recent Labs  Lab 07/19/19 1719 07/19/19 2050 07/20/19 0724 07/20/19 1140  GLUCAP 105* 150* 149* 161*  Scheduled Meds: . apixaban  2.5 mg Oral BID  . vitamin C  1,000 mg Oral Daily  . cholecalciferol  2,000 Units Oral Daily  . hydrALAZINE  50 mg Oral TID  . insulin aspart  0-20 Units Subcutaneous TID WC  . insulin aspart  6 Units Subcutaneous TID WC  . multivitamin with minerals  1 tablet Oral Daily  . neomycin-polymyxin-dexameth  1 application Left Eye BID  . omega-3 acid ethyl esters  1 g Oral Daily  . rosuvastatin  5 mg Oral Once per day on Mon Wed Fri  . silver sulfADIAZINE  1 application Topical BID  . sodium chloride flush  3 mL Intravenous Q12H  . spironolactone  25 mg Oral Daily   Continuous Infusions: . sodium  chloride    . furosemide (LASIX) infusion      Assessment/Plan:  1. Acute on chronic diastolic congestive heart failure with anasarca.  The patient had a tremendous amount of weight gain and his baseline weight is between 245 and 250.  Patient is 271 pounds.  Case discussed with cardiology and nephrology and the decision to place the patient on Lasix drip was made.  Patient was also placed on low-dose spironolactone. 2. 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  Need to watch creatinine closely with diuresis.  Patient on sliding scale insulin. 3. Chronic atrial fibrillation on Eliquis 4. Hyperlipidemia unspecified on Crestor 5. Essential hypertension.  Currently on hydralazine and spironolactone and Lasix drip.  Norvasc on hold secondary to anasarca  Code Status:     Code Status Orders  (From admission, onward)         Start     Ordered   07/19/19 1309  Full code  Continuous     07/19/19 1314        Code Status History    Date Active Date Inactive Code Status Order ID Comments User Context   05/07/2019 2201 05/10/2019 0208 Full Code [id number]  Athena Masse, MD ED   07/06/2018 2253 07/12/2018 1932 Full Code [id number]  Lance Coon, MD Inpatient   Advance Care Planning Activity    Advance Directive Documentation     Most Recent Value  Type of Advance Directive  Living will, Healthcare Power of Attorney  Pre-existing out of facility DNR order (yellow form or pink MOST form)  --  "MOST" Form in Place?  --     Family Communication: Spoke with [name] on the phone Disposition Plan: Likely will need a few days of IV diuresis with IV Lasix drip to remove fluid and improved swelling and anasarca prior to disposition.  Consultants:  Cardiology  Nephrology  Time spent: 30 minutes in coordination of care  [place]

## 2019-07-20 NOTE — Progress Notes (Signed)
Progress Note  Patient Name: Kyle Roberson Date of Encounter: 07/20/2019  Primary Cardiologist: Kyle Roberson, [facility]   Cardiology Consultation:   Patient ID: Kyle Roberson MRN: [id number]; DOB: 10/28/1940  Admit date: 07/19/2019 Date of Consult: 07/20/2019  Primary Care Provider: Glean Hess, MD Primary Cardiologist: Kyle Roberson Reason for consult: Acute on chronic diastolic CHF Physician requesting consult Kyle Roberson  Patient Profile:   Kyle Roberson is a 79 y.o. male with history of coronary artery disease, persistent atrial fibrillation, pulmonary hypertension on echo February 2021 , hypertension, hyperlipidemia, type 2 diabetes mellitus, chronic kidney disease, GERD, and hyperparathyroidism, who presented to the hospital with worsening leg swelling, shortness of breath consistent with acute on chronic diastolic CHF  History of Present Illness:   He was seen in the clinic approximately 6 days ago at which time Kyle Roberson complained of increasing edema and shortness of breath.  He was significantly volume overloaded but was reluctant to be directly admitted to the hospital for inpatient management.  His torsemide was increased to 60 twice daily  On this higher regiment he was not much better, Some increased urine output but weight and edema continue to be well above his baseline Continued shortness of breath with minimal exertion Sleeping at 45 degrees, he did report having PND orthopnea No chest pain palpitations lightheadedness Based on evaluation by Kyle Roberson he would was sent to the hospital for further management of his CHF  Started on Lasix IV twice daily, only 1 L negative past 24 hours Baseline creatinine 2.5 Being followed by hospitalist service, nephrology  He reports his legs are very swollen, tight, abdomen is distended well above his baseline  Past Medical History:  Diagnosis Date  . AKI (acute kidney injury) ([place]) 07/06/2018  . CAD (coronary artery  disease)   . Diabetes ([place])   . GERD (gastroesophageal reflux disease)   . HLD (hyperlipidemia)   . HTN (hypertension)     Past Surgical History:  Procedure Laterality Date  . CORONARY STENT INTERVENTION    . CYSTOSCOPY    . HERNIA REPAIR       Home Medications:  Prior to Admission medications   Medication Sig Start Date End Date Taking? Authorizing Provider  amLODipine (NORVASC) 10 MG tablet Take 1 tablet (10 mg total) by mouth daily. 07/19/19 09/04/20  Kyle Hess, MD  apixaban (ELIQUIS) 2.5 MG TABS tablet Take 1 tablet (2.5 mg total) by mouth 2 (two) times daily. 07/19/19   Kyle Hess, MD  Ascorbic Acid (VITAMIN C) 1000 MG tablet Take 1,000 mg by mouth daily.    [provider]  cholecalciferol (VITAMIN D3) 25 MCG (1000 UT) tablet Take 2,000 Units by mouth daily.     [provider]  Flaxseed, Linseed, (FLAX SEED OIL) 1000 MG CAPS Take by mouth.    [provider]  glipiZIDE (GLUCOTROL XL) 10 MG 24 hr tablet Take 1 tablet (10 mg total) by mouth 2 (two) times daily. 07/19/19 08/30/20  Kyle Hess, MD  hydrALAZINE (APRESOLINE) 50 MG tablet Take 1 tablet (50 mg total) by mouth 3 (three) times daily. 05/05/19 08/03/19  End, Kyle Gave, MD  insulin NPH Human (NOVOLIN N) 100 UNIT/ML injection Inject 10-15 Units into the skin daily. Take 10-15 units twice a day after breakfast and before bedtime per Sliding Scale (BS>250: 15 units, BS= 200-250:10 units) 11/30/17   [provider]  levalbuterol (XOPENEX HFA) 45 MCG/ACT inhaler Inhale 2 puffs into the lungs every 6 (six)  hours as needed for wheezing or shortness of breath. 07/12/19   Kyle Pita, MD  MAXITROL 3.5-10000-0.1 OINT Place 1 application into the left eye 2 (two) times daily.  05/03/19   [provider]  Multiple Vitamins-Minerals (MULTIVITAMIN WITH MINERALS) tablet Take 1 tablet by mouth daily.    [provider]  nitroGLYCERIN (NITROLINGUAL) 0.4 MG/SPRAY spray  Place 1 spray under the tongue as directed. 07/19/19 08/06/20  Kyle Hess, MD  Omega 3 1000 MG CAPS Take 1 capsule by mouth daily. 04/14/06   [provider]  rosuvastatin (CRESTOR) 5 MG tablet Take 1 tablet (5 mg total) by mouth 3 (three) times a week. 01/11/19   Kyle Hess, MD  silver sulfADIAZINE (SILVADENE) 1 % cream Apply 1 application topically 2 (two) times daily. To leg wounds 05/16/19 05/15/20  Kyle Hess, MD  torsemide (DEMADEX) 20 MG tablet Take 60 mg by mouth 2 (two) times daily.     [provider]    Inpatient Medications: Scheduled Meds: . amLODipine  10 mg Oral Daily  . apixaban  2.5 mg Oral BID  . vitamin C  1,000 mg Oral Daily  . cholecalciferol  2,000 Units Oral Daily  . furosemide  80 mg Intravenous Q12H  . hydrALAZINE  50 mg Oral TID  . insulin aspart  0-20 Units Subcutaneous TID WC  . insulin aspart  6 Units Subcutaneous TID WC  . multivitamin with minerals  1 tablet Oral Daily  . neomycin-polymyxin-dexameth  1 application Left Eye BID  . omega-3 acid ethyl esters  1 g Oral Daily  . potassium chloride  40 mEq Oral Once  . rosuvastatin  5 mg Oral Once per day on Mon Wed Fri  . silver sulfADIAZINE  1 application Topical BID  . sodium chloride flush  3 mL Intravenous Q12H   Continuous Infusions: . sodium chloride     PRN Meds: sodium chloride, acetaminophen, levalbuterol, nitroGLYCERIN, ondansetron (ZOFRAN) IV, sodium chloride flush  Allergies:    Allergies  Allergen Reactions  . Atorvastatin Other (See Comments)  . Simvastatin Other (See Comments)    Social History:   Social History   Socioeconomic History  . Marital status: Married    Spouse name: Not on file  . Number of children: Not on file  . Years of education: Not on file  . Highest education level: Not on file  Occupational History  . Not on file  Tobacco Use  . Smoking status: Former Research scientist (life sciences)  . Smokeless tobacco: Never Used  Substance and Sexual Activity   . Alcohol use: Yes    Alcohol/week: 2.0 standard drinks    Types: 2 Glasses of wine per week  . Drug use: Not Currently  . Sexual activity: Not on file  Other Topics Concern  . Not on file  Social History Narrative   Lives at home with wife   Social Determinants of Health   Financial Resource Strain:   . Difficulty of Paying Living Expenses:   Food Insecurity:   . Worried About Charity fundraiser in the Last Year:   . Arboriculturist in the Last Year:   Transportation Needs:   . Film/video editor (Medical):   Marland Kitchen Lack of Transportation (Non-Medical):   Physical Activity:   . Days of Exercise per Week:   . Minutes of Exercise per Session:   Stress:   . Feeling of Stress :   Social Connections:   . Frequency of  Communication with Friends and Family:   . Frequency of Social Gatherings with Friends and Family:   . Attends Religious Services:   . Active Member of Clubs or Organizations:   . Attends Archivist Meetings:   Marland Kitchen Marital Status:   Intimate Partner Violence:   . Fear of Current or Ex-Partner:   . Emotionally Abused:   Marland Kitchen Physically Abused:   . Sexually Abused:     Family History:    Family History  Problem Relation Age of Onset  . Pancreatic cancer Mother   . CAD Father   . Diabetes Brother      ROS:  Please see the history of present illness.  Review of Systems  Constitutional: Negative.        Weight gain  HENT: Negative.   Respiratory: Positive for shortness of breath.   Cardiovascular: Positive for orthopnea and leg swelling.  Gastrointestinal: Negative.   Musculoskeletal: Negative.   Neurological: Negative.   Psychiatric/Behavioral: Negative.   All other systems reviewed and are negative.    Physical Exam/Data:   Vitals:   07/20/19 0438 07/20/19 0725 07/20/19 0853 07/20/19 1141  BP: (!) 169/57 (!) 164/62  135/72  Pulse: 64 (!) 54  62  Resp: 20 18  18   Temp: 97.6 F (36.4 C) 98.4 F (36.9 C)  98 F (36.7 C)  TempSrc:  Oral     SpO2: 92% 92%  91%  Weight:   123.2 kg     Intake/Output Summary (Last 24 hours) at 07/20/2019 1222 Last data filed at 07/20/2019 1102 Gross per 24 hour  Intake 440 ml  Output 1750 ml  Net -1310 ml   Last 3 Weights 07/20/2019 07/19/2019 07/19/2019  Weight (lbs) 271 lb 8 oz 275 lb 275 lb 6 oz  Weight (kg) 123.152 kg 124.739 kg 124.909 kg     Body mass index is 41.28 kg/m.  General:  Well nourished, well developed, in no acute distress HEENT: normal Lymph: no adenopathy Neck:  JVD 10+ Endocrine:  No thryomegaly Vascular: No carotid bruits; FA pulses 2+ bilaterally without bruits  Cardiac:  normal S1, S2; RRR; no murmur  Lungs: Clear to auscultation with Rales, dullness at the bases bilaterally Abd: soft, nontender, no hepatomegaly  Ext: no edema Musculoskeletal:  No deformities, BUE and BLE strength normal and equal Skin: warm and dry  Neuro:  CNs 2-12 intact, no focal abnormalities noted Psych:  Normal affect   EKG:  The EKG was personally reviewed and demonstrates:   Atrial fibrillation (versus junctional rhythm)  Telemetry:  Telemetry was personally reviewed and demonstrates: Suspect atrial fibrillation  Relevant CV Studies: Echocardiogram May 08, 2019 1. Left ventricular ejection fraction, by estimation, is 55 to 60%. The  left ventricle has normal function. The left ventricle has no regional  wall motion abnormalities. There is mild left ventricular hypertrophy.  Left ventricular diastolic parameters  are indeterminate.  2. Right ventricular systolic function is normal. The right ventricular  size is normal. There is moderately elevated pulmonary artery systolic  pressure. The estimated right ventricular systolic pressure is 14.9 mmHg.  3. Left atrial size was moderately dilated.  4. The mitral valve is normal in structure and function. Mild to moderate  mitral valve regurgitation. No evidence of mitral stenosis.  5. The aortic valve is normal in  structure and function. Aortic valve  regurgitation is mild. Mild aortic valve sclerosis is present, with no  evidence of aortic valve stenosis.  6. Moderately dilated pulmonary artery.  7. The inferior vena cava is dilated in size with <50% respiratory  variability, suggesting right atrial pressure of 15 mmHg.   Laboratory Data:  High Sensitivity Troponin:   Recent Labs  Lab 07/19/19 1325 07/19/19 1510  TROPONINIHS 17 17     Chemistry Recent Labs  Lab 07/14/19 1134 07/19/19 1132 07/20/19 0518  NA 140 144 142  K 4.1 3.6 3.5  CL 100 99 100  CO2 30 34* 31  GLUCOSE 175* 79 173*  BUN 52* 56* 57*  CREATININE 2.52* 2.58* 2.50*  CALCIUM 9.3 9.2 8.9  GFRNONAA 23* 23* 23*  GFRAA 27* 26* 27*  ANIONGAP 10 11 11     Recent Labs  Lab 07/14/19 1134  PROT 7.2  ALBUMIN 3.8  AST 17  ALT 17  ALKPHOS 56  BILITOT 0.8   Hematology Recent Labs  Lab 07/14/19 1134  WBC 8.8  RBC 4.21*  HGB 11.2*  HCT 37.5*  MCV 89.1  MCH 26.6  MCHC 29.9*  RDW 15.8*  PLT 201   BNP Recent Labs  Lab 07/14/19 1134  BNP 224.0*    DDimer No results for input(s): DDIMER in the last 168 hours.   Radiology/Studies:  No results found.      Assessment and Plan:   Acute on chronic diastolic CHF.  Pulmonary hypertension --- Massive fluid retention noted on exam legs extending into his abdomen, pitting --Management will be complicated by underlying renal failure creatinine 2.5 --He has received several doses IV Lasix, minimal urine output, possibly -1 L.  In effort to augment diuresis would consider Lasix infusion May need small doses of metolazone 2.5 mg daily over the weekend if urination output does not pick up significantly --Fluid retention possibly exacerbated by underlying arrhythmia -Hold amlodipine as below -Continue hydralazine -If additional blood pressure medications are needed, consider long-acting nitrates, spironolactone  Leg edema Would hold his amlodipine, ischemic  symptoms worse Different medication for hypertension if needed  Atrial fibrillation, persistent Continue Eliquis Consider holding the Eliquis over the weekend in preparation for possible right heart catheterization early next week -Right heart catheterization numbers would help guide diuresis and determine if medications needed for pulmonary hypertension He has appointment with advanced heart failure clinic Jul 27, 2019 -At that time will place on heparin infusion Currently not on any rate controlling agents  Diabetes type 2 in the setting of chronic kidney disease Hemoglobin A1c estimated 7.2 Low-carb diet recommended  Coronary artery disease with stable angina History of remote stenting of the LAD, angioplasty of diagonal branch, moderate RCA disease -Denies any unstable angina symptoms -No plan for ischemic work-up at this time   Total encounter time more than 110 minutes  Greater than 50% was spent in counseling and coordination of care with the patient    For questions or updates, please contact [facility] Please consult [email or web address] for contact info under     Signed, Ida Rogue, MD  07/20/2019 12:22 PM

## 2019-07-20 NOTE — Progress Notes (Signed)
Tele called at 0510 and reported SB at 23.  Upon entry into room, pt HR 62.  Pt denies distress.

## 2019-07-20 NOTE — Progress Notes (Signed)
[facility]  ROUNDING NOTE   Subjective:   [name] was admitted to [facility] on 07/19/2019 for Acute on chronic diastolic CHF (congestive heart failure) [facility]) [I50.33]  Patient recently established withour practice, seen by my partner on 3/25 for CKD stage IV work up. Found to have positive ANA.   Objective:  Vital signs in last 24 hours:  Temp:  [97.5 F (36.4 C)-98.4 F (36.9 C)] 98 F (36.7 C) (04/29 1141) Pulse Rate:  [54-64] 62 (04/29 1141) Resp:  [18-20] 18 (04/29 1141) BP: (135-169)/(57-72) 135/72 (04/29 1141) SpO2:  [91 %-93 %] 91 % (04/29 1141) Weight:  [123.2 kg] 123.2 kg (04/29 0853)  Weight change:  Filed Weights   07/19/19 1309 07/20/19 0853  Weight: 124.7 kg 123.2 kg    Intake/Output: I/O last 3 completed shifts: In: 200 [P.O.:200] Out: 1350 [Urine:1350]   Intake/Output this shift:  Total I/O In: 240 [P.O.:240] Out: 400 [Urine:400]  Physical Exam: General: NAD, sitting up in bed  Head: Normocephalic, atraumatic. Moist oral mucosal membranes  Eyes: Anicteric, PERRL  Neck: Supple, trachea midline  Lungs:  Mild crackles at base  Heart: Regular rate and rhythm  Abdomen:  Soft, nontender, +abdominal wall edema  Extremities:  +++ peripheral edema.  Neurologic: Nonfocal, moving all four extremities  Skin: No lesions       Basic Metabolic Panel: Recent Labs  Lab 07/14/19 1134 07/19/19 1132 07/20/19 0518  NA 140 144 142  K 4.1 3.6 3.5  CL 100 99 100  CO2 30 34* 31  GLUCOSE 175* 79 173*  BUN 52* 56* 57*  CREATININE 2.52* 2.58* 2.50*  CALCIUM 9.3 9.2 8.9    Liver Function Tests: Recent Labs  Lab 07/14/19 1134  AST 17  ALT 17  ALKPHOS 56  BILITOT 0.8  PROT 7.2  ALBUMIN 3.8   No results for input(s): LIPASE, AMYLASE in the last 168 hours. No results for input(s): AMMONIA in the last 168 hours.  CBC: Recent Labs  Lab 07/14/19 1134  WBC 8.8  HGB 11.2*  HCT 37.5*  MCV 89.1  PLT 201    Cardiac Enzymes: No  results for input(s): CKTOTAL, CKMB, CKMBINDEX, TROPONINI in the last 168 hours.  BNP: Invalid input(s): POCBNP  CBG: Recent Labs  Lab 07/19/19 1719 07/19/19 2050 07/20/19 0724  GLUCAP 105* 150* 149*    Microbiology: Results for orders placed or performed during the hospital encounter of 05/07/19  Blood Culture (routine x 2)     Status: None   Collection Time: 05/07/19  6:45 PM   Specimen: BLOOD  Result Value Ref Range Status   Specimen Description BLOOD R AC  Final   Special Requests   Final    BOTTLES DRAWN AEROBIC AND ANAEROBIC Blood Culture results may not be optimal due to an excessive volume of blood received in culture bottles   Culture   Final    NO GROWTH 5 DAYS Performed at [facility], [address] [place], [place] [address]    Report Status 05/12/2019 FINAL  Final  Blood Culture (routine x 2)     Status: None   Collection Time: 05/07/19  6:45 PM   Specimen: BLOOD  Result Value Ref Range Status   Specimen Description BLOOD R HAND  Final   Special Requests   Final    BOTTLES DRAWN AEROBIC AND ANAEROBIC Blood Culture results may not be optimal due to an inadequate volume of blood received in culture bottles   Culture  Final    NO GROWTH 5 DAYS Performed at [facility], [address] [place], [place] [address]    Report Status 05/12/2019 FINAL  Final  Respiratory Panel by RT PCR (Flu A&B, Covid) - Nasopharyngeal Swab     Status: None   Collection Time: 05/07/19 11:58 PM   Specimen: Nasopharyngeal Swab  Result Value Ref Range Status   SARS Coronavirus 2 by RT PCR NEGATIVE NEGATIVE Final    Comment: (NOTE) SARS-CoV-2 target nucleic acids are NOT DETECTED. The SARS-CoV-2 RNA is generally detectable in upper respiratoy specimens during the acute phase of infection. The lowest concentration of SARS-CoV-2 viral copies this assay can detect is 131 copies/mL. A negative result does not preclude SARS-Cov-2 infection and should not be used  as the sole basis for treatment or other patient management decisions. A negative result may occur with  improper specimen collection/handling, submission of specimen other than nasopharyngeal swab, presence of viral mutation(s) within the areas targeted by this assay, and inadequate number of viral copies (<131 copies/mL). A negative result must be combined with clinical observations, patient history, and epidemiological information. The expected result is Negative. Fact Sheet for Patients:  PinkCheek.be Fact Sheet for Healthcare Providers:  GravelBags.it This test is not yet ap proved or cleared by the [place] FDA and  has been authorized for detection and/or diagnosis of SARS-CoV-2 by FDA under an Emergency Use Authorization (EUA). This EUA will remain  in effect (meaning this test can be used) for the duration of the COVID-19 declaration under Section 564(b)(1) of the Act, 21 U.S.C. section 360bbb-3(b)(1), unless the authorization is terminated or revoked sooner.    Influenza A by PCR NEGATIVE NEGATIVE Final   Influenza B by PCR NEGATIVE NEGATIVE Final    Comment: (NOTE) The Xpert Xpress SARS-CoV-2/FLU/RSV assay is intended as an aid in  the diagnosis of influenza from Nasopharyngeal swab specimens and  should not be used as a sole basis for treatment. Nasal washings and  aspirates are unacceptable for Xpert Xpress SARS-CoV-2/FLU/RSV  testing. Fact Sheet for Patients: PinkCheek.be Fact Sheet for Healthcare Providers: GravelBags.it This test is not yet approved or cleared by the [place] FDA and  has been authorized for detection and/or diagnosis of SARS-CoV-2 by  FDA under an Emergency Use Authorization (EUA). This EUA will remain  in effect (meaning this test can be used) for the duration of the  Covid-19 declaration under Section 564(b)(1) of the Act,  21  U.S.C. section 360bbb-3(b)(1), unless the authorization is  terminated or revoked. Performed at [facility], [address] [place], [place] [address]     Coagulation Studies: No results for input(s): LABPROT, INR in the last 72 hours.  Urinalysis: No results for input(s): COLORURINE, LABSPEC, PHURINE, GLUCOSEU, HGBUR, BILIRUBINUR, KETONESUR, PROTEINUR, UROBILINOGEN, NITRITE, LEUKOCYTESUR in the last 72 hours.  Invalid input(s): APPERANCEUR    Imaging: No results found.   Medications:   . sodium chloride    . furosemide (LASIX) infusion     . apixaban  2.5 mg Oral BID  . vitamin C  1,000 mg Oral Daily  . cholecalciferol  2,000 Units Oral Daily  . hydrALAZINE  50 mg Oral TID  . insulin aspart  0-20 Units Subcutaneous TID WC  . insulin aspart  6 Units Subcutaneous TID WC  . multivitamin with minerals  1 tablet Oral Daily  . neomycin-polymyxin-dexameth  1 application Left Eye BID  . omega-3 acid ethyl esters  1 g Oral Daily  .  rosuvastatin  5 mg Oral Once per day on Mon Wed Fri  . silver sulfADIAZINE  1 application Topical BID  . sodium chloride flush  3 mL Intravenous Q12H  . spironolactone  25 mg Oral Daily   sodium chloride, acetaminophen, levalbuterol, nitroGLYCERIN, ondansetron (ZOFRAN) IV, sodium chloride flush  Assessment/ Plan:  [name] is a 79 y.o. white  male with atrial fibrillation, congestive heart failure, hypertension, coronary artery disease, diabetes mellitus type II who is admitted to [facility] on 07/19/2019 for Acute on chronic diastolic CHF (congestive heart failure) ([place]) [I50.33]  1. Acute Renal Failure: on chronic kidney disease stage IV. Baseline creatinine of 2.29, GFR of 26 on 06/15/19.  Chronic kidney disease with proteinuria and positive ANA. 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  Acute kidney injury secondary to acute cardio-renal syndrome.   2. Acute exacerbation of chronic diastolic  congestive heart failure: echocardiogram on 05/08/19 with preserved systolic function.  Home regimen was just changed to torsemide 60mg  bid. However he has failed outpatient management.   3. Hypertension: home regimen of amlodipine, hydralazine and torsemide.   4. Diabetes mellitus type II with chronic kidney disease: insulin dependent. Hemoglobin A1c of 7.2% on 07/19/19.   Plan - start furosemide gtt - Start spironolactone  - Fluid restriction, salt restriction and daily weights.    LOS: 1 Estefania Kamiya 4/29/20211:17 PM

## 2019-07-20 NOTE — Progress Notes (Signed)
CCMD notified RN of 13 beats of junctional rhythm.  Cardiology notified verbally and rounding on pt.  Will continue to assess

## 2019-07-21 ENCOUNTER — Inpatient Hospital Stay: Payer: Medicare HMO

## 2019-07-21 DIAGNOSIS — I5033 Acute on chronic diastolic (congestive) heart failure: Secondary | ICD-10-CM | POA: Diagnosis not present

## 2019-07-21 DIAGNOSIS — E782 Mixed hyperlipidemia: Secondary | ICD-10-CM

## 2019-07-21 DIAGNOSIS — R601 Generalized edema: Secondary | ICD-10-CM | POA: Diagnosis not present

## 2019-07-21 DIAGNOSIS — I482 Chronic atrial fibrillation, unspecified: Secondary | ICD-10-CM

## 2019-07-21 DIAGNOSIS — I2510 Atherosclerotic heart disease of native coronary artery without angina pectoris: Secondary | ICD-10-CM

## 2019-07-21 DIAGNOSIS — R1900 Intra-abdominal and pelvic swelling, mass and lump, unspecified site: Secondary | ICD-10-CM

## 2019-07-21 LAB — BASIC METABOLIC PANEL
Anion gap: 12 (ref 5–15)
BUN: 57 mg/dL — ABNORMAL HIGH (ref 8–23)
CO2: 32 mmol/L (ref 22–32)
Calcium: 9 mg/dL (ref 8.9–10.3)
Chloride: 100 mmol/L (ref 98–111)
Creatinine, Ser: 2.54 mg/dL — ABNORMAL HIGH (ref 0.61–1.24)
GFR calc Af Amer: 27 mL/min — ABNORMAL LOW (ref >60)
GFR calc non Af Amer: 23 mL/min — ABNORMAL LOW (ref >60)
Glucose, Bld: 105 mg/dL — ABNORMAL HIGH (ref 70–99)
Potassium: 3.8 mmol/L (ref 3.5–5.1)
Sodium: 144 mmol/L (ref 135–145)

## 2019-07-21 LAB — GLUCOSE, CAPILLARY
Glucose-Capillary: 115 mg/dL — ABNORMAL HIGH (ref 70–99)
Glucose-Capillary: 105 mg/dL — ABNORMAL HIGH (ref 70–99)
Glucose-Capillary: 168 mg/dL — ABNORMAL HIGH (ref 70–99)

## 2019-07-21 MED ORDER — ISOSORBIDE MONONITRATE ER 30 MG PO TB24
30.0000 mg | ORAL_TABLET | Freq: Every day | ORAL | Status: DC
  Administered 2019-07-21 – 2019-07-23 (×3): 30 mg via ORAL
  Filled 2019-07-21 (×3): qty 1

## 2019-07-21 NOTE — Progress Notes (Signed)
Progress Note  Patient Name: Kyle Roberson Date of Encounter: 07/21/2019  Primary Cardiologist: End  Subjective   Dyspnea is unchanged. Orthopnea persists. No chest pain. Documented UOP 945 mL for the past 24 hours with a net - 2 L for the admission. Weight 123.2-->123 kg. Renal function stable with BUN/SCr 57/2.54. Remains on Lasix gtt at 4 mg/hr.  Inpatient Medications    Scheduled Meds: . apixaban  2.5 mg Oral BID  . vitamin C  1,000 mg Oral Daily  . cholecalciferol  2,000 Units Oral Daily  . hydrALAZINE  50 mg Oral TID  . insulin aspart  0-20 Units Subcutaneous TID WC  . insulin aspart  6 Units Subcutaneous TID WC  . multivitamin with minerals  1 tablet Oral Daily  . neomycin-polymyxin-dexameth  1 application Left Eye BID  . omega-3 acid ethyl esters  1 g Oral Daily  . rosuvastatin  5 mg Oral Once per day on Mon Wed Fri  . silver sulfADIAZINE  1 application Topical BID  . sodium chloride flush  3 mL Intravenous Q12H  . spironolactone  25 mg Oral Daily   Continuous Infusions: . sodium chloride    . furosemide (LASIX) infusion 4 mg/hr (07/20/19 1640)   PRN Meds: sodium chloride, acetaminophen, levalbuterol, nitroGLYCERIN, ondansetron (ZOFRAN) IV, sodium chloride flush   Vital Signs    Vitals:   07/20/19 1141 07/20/19 1602 07/20/19 2058 07/21/19 0428  BP: 135/72 (!) 149/61 138/80 (!) 150/60  Pulse: 62 63 (!) 56 63  Resp: 18 18 16 20   Temp: 98 F (36.7 C) 98.4 F (36.9 C) 97.6 F (36.4 C) 97.8 F (36.6 C)  TempSrc: Oral  Oral Oral  SpO2: 91% 99% 99% 98%  Weight:    123 kg    Intake/Output Summary (Last 24 hours) at 07/21/2019 0737 Last data filed at 07/21/2019 0500 Gross per 24 hour  Intake 529.83 ml  Output 1475 ml  Net -945.17 ml   Filed Weights   07/19/19 1309 07/20/19 0853 07/21/19 0428  Weight: 124.7 kg 123.2 kg 123 kg    Telemetry    Afib with ventricular rates in the 50s to 80s bpm, rare PVC - Personally Reviewed  ECG    No new  tracings - Personally Reviewed  Physical Exam   GEN: No acute distress. Left eye injected.  Neck: JVD difficult to assess secondary to body habitus. Cardiac: RRR, no murmurs, rubs, or gallops.  Respiratory: Diminished and coarse breath sounds bilaterally.  GI: Soft, nontender, non-distended.   MS: 2+ bilateral lower extremity pitting edema to the thighs; No deformity. Neuro:  Alert and oriented x 3; Nonfocal.  Psych: Normal affect.  Labs    Chemistry Recent Labs  Lab 07/14/19 1134 07/14/19 1134 07/19/19 1132 07/20/19 0518 07/21/19 0517  NA 140   < > 144 142 144  K 4.1   < > 3.6 3.5 3.8  CL 100   < > 99 100 100  CO2 30   < > 34* 31 32  GLUCOSE 175*   < > 79 173* 105*  BUN 52*   < > 56* 57* 57*  CREATININE 2.52*   < > 2.58* 2.50* 2.54*  CALCIUM 9.3   < > 9.2 8.9 9.0  PROT 7.2  --   --   --   --   ALBUMIN 3.8  --   --   --   --   AST 17  --   --   --   --  ALT 17  --   --   --   --   ALKPHOS 56  --   --   --   --   BILITOT 0.8  --   --   --   --   GFRNONAA 23*   < > 23* 23* 23*  GFRAA 27*   < > 26* 27* 27*  ANIONGAP 10   < > 11 11 12    < > = values in this interval not displayed.     Hematology Recent Labs  Lab 07/14/19 1134  WBC 8.8  RBC 4.21*  HGB 11.2*  HCT 37.5*  MCV 89.1  MCH 26.6  MCHC 29.9*  RDW 15.8*  PLT 201    Cardiac EnzymesNo results for input(s): TROPONINI in the last 168 hours. No results for input(s): TROPIPOC in the last 168 hours.   BNP Recent Labs  Lab 07/14/19 1134  BNP 224.0*     DDimer No results for input(s): DDIMER in the last 168 hours.   Radiology    No results found.  Cardiac Studies   2D echo 04/2019: 1. Left ventricular ejection fraction, by estimation, is 55 to 60%. The  left ventricle has normal function. The left ventricle has no regional  wall motion abnormalities. There is mild left ventricular hypertrophy.  Left ventricular diastolic parameters  are indeterminate.  2. Right ventricular systolic function  is normal. The right ventricular  size is normal. There is moderately elevated pulmonary artery systolic  pressure. The estimated right ventricular systolic pressure is 16.1 mmHg.  3. Left atrial size was moderately dilated.  4. The mitral valve is normal in structure and function. Mild to moderate  mitral valve regurgitation. No evidence of mitral stenosis.  5. The aortic valve is normal in structure and function. Aortic valve  regurgitation is mild. Mild aortic valve sclerosis is present, with no  evidence of aortic valve stenosis.  6. Moderately dilated pulmonary artery.  7. The inferior vena cava is dilated in size with <50% respiratory  variability, suggesting right atrial pressure of 15 mmHg.   Patient Profile     79 y.o. male with history of CAD, persistent Afib, HFpEF, pulmonary hypertension, DM2, HTN, HLD, CKD stage IV, hyperparathyroidism, and GERD who we are seeing for acute on chronic HFpEF.   Assessment & Plan    1. Acute on chronic HFpEF/pulmonary hypertension: -Volume status remains significantly elevated -He has been transitioned from IV push Lasix to Lasix gtt in an effort to augment diuresis with preservation of renal function -Documented UOP is still ~ 1 L over the past 24 hours with a net - 2 L for the admission -Consider metolazone 2.5 mg today, will discuss with MD as well as nephrology  -Consider RHC early next week, pending respiratory status and orthopnea  -He remains on spironolactone, monitor with underlying CKD -Add Imdur secondary to mildly elevated BP -Hydralazine  -He has an appointment with the advanced heart failure clinic 5/6 -Amlodipine has been held secondary to lower extremity swelling  2. CAD involving the native coronary arteries with stable angina: -History of remote stenting to the LAD, angioplasty to the diagonal branch with moderate RCA disease -On Eliquis in place of ASA -No chest pain -With underlying CKD stage, unlikely to plan for  LHC this admission  3. Persistent Afib: -Ventricular rates are well controlled, not on rate controlling medications -Transition from Eliquis 2.5 mg bid (age and SCr) to heparin gtt on the morning of 5/1, in preparation  for potential RHC early next week, pending orthopnea, to help gauge diuresis  -CHADS2VASc at least 6 (CHF, HTN, age x 2, DM, vascular disease) -Cannot exclude brief episode of junctional rhythm on telemetry  4. 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 HTN: -BP in the 867E systolic this morning -Medications as above  6. HLD: -Tolerating Crestor -LDL 73 from 07/2018   For questions or updates, please contact [place] Please consult [email or web address] for contact info under Cardiology/STEMI.    Signed, Christell Faith, PA-C [place] Pager: [phone] 07/21/2019, 7:37 AM

## 2019-07-21 NOTE — Progress Notes (Signed)
[facility]  ROUNDING NOTE   Subjective:   UOP 1422mL Furosemide gtt Patient states he is breathing better and his edema has improved  Objective:  Vital signs in last 24 hours:  Temp:  [97.6 F (36.4 C)-97.8 F (36.6 C)] 97.7 F (36.5 C) (04/30 1102) Pulse Rate:  [56-63] 56 (04/30 1624) Resp:  [16-20] 18 (04/30 1624) BP: (125-151)/(60-84) 125/66 (04/30 1624) SpO2:  [91 %-99 %] 91 % (04/30 1624) Weight:  [262 kg] 123 kg (04/30 0428)  Weight change: -1.588 kg Filed Weights   07/19/19 1309 07/20/19 0853 07/21/19 0428  Weight: 124.7 kg 123.2 kg 123 kg    Intake/Output: I/O last 3 completed shifts: In: 729.8 [P.O.:680; I.V.:49.8] Out: 2825 [Urine:2825]   Intake/Output this shift:  Total I/O In: -  Out: 325 [Urine:325]  Physical Exam: General: NAD, sitting up in bed  Head: Normocephalic, atraumatic. Moist oral mucosal membranes  Eyes: Anicteric, PERRL  Neck: Supple, trachea midline  Lungs:  Mild crackles at base  Heart: Regular rate and rhythm  Abdomen:  Soft, nontender, +abdominal wall edema  Extremities:  +++ peripheral edema.  Neurologic: Nonfocal, moving all four extremities  Skin: No lesions       Basic Metabolic Panel: Recent Labs  Lab 07/19/19 1132 07/20/19 0518 07/21/19 0517  NA 144 142 144  K 3.6 3.5 3.8  CL 99 100 100  CO2 34* 31 32  GLUCOSE 79 173* 105*  BUN 56* 57* 57*  CREATININE 2.58* 2.50* 2.54*  CALCIUM 9.2 8.9 9.0    Liver Function Tests: No results for input(s): AST, ALT, ALKPHOS, BILITOT, PROT, ALBUMIN in the last 168 hours. No results for input(s): LIPASE, AMYLASE in the last 168 hours. No results for input(s): AMMONIA in the last 168 hours.  CBC: No results for input(s): WBC, NEUTROABS, HGB, HCT, MCV, PLT in the last 168 hours.  Cardiac Enzymes: No results for input(s): CKTOTAL, CKMB, CKMBINDEX, TROPONINI in the last 168 hours.  BNP: Invalid input(s): POCBNP  CBG: Recent Labs  Lab 07/20/19 1140  07/20/19 1627 07/20/19 2128 07/21/19 0816 07/21/19 1103  GLUCAP 161* 126* 115* 105* 168*    Microbiology: Results for orders placed or performed during the hospital encounter of 05/07/19  Blood Culture (routine x 2)     Status: None   Collection Time: 05/07/19  6:45 PM   Specimen: BLOOD  Result Value Ref Range Status   Specimen Description BLOOD R AC  Final   Special Requests   Final    BOTTLES DRAWN AEROBIC AND ANAEROBIC Blood Culture results may not be optimal due to an excessive volume of blood received in culture bottles   Culture   Final    NO GROWTH 5 DAYS Performed at [facility], [address] [place], [place] [address]    Report Status 05/12/2019 FINAL  Final  Blood Culture (routine x 2)     Status: None   Collection Time: 05/07/19  6:45 PM   Specimen: BLOOD  Result Value Ref Range Status   Specimen Description BLOOD R HAND  Final   Special Requests   Final    BOTTLES DRAWN AEROBIC AND ANAEROBIC Blood Culture results may not be optimal due to an inadequate volume of blood received in culture bottles   Culture   Final    NO GROWTH 5 DAYS Performed at [facility], [address], [place], [place] [address]    Report Status 05/12/2019 FINAL  Final  Respiratory Panel by RT PCR (  Flu A&B, Covid) - Nasopharyngeal Swab     Status: None   Collection Time: 05/07/19 11:58 PM   Specimen: Nasopharyngeal Swab  Result Value Ref Range Status   SARS Coronavirus 2 by RT PCR NEGATIVE NEGATIVE Final    Comment: (NOTE) SARS-CoV-2 target nucleic acids are NOT DETECTED. The SARS-CoV-2 RNA is generally detectable in upper respiratoy specimens during the acute phase of infection. The lowest concentration of SARS-CoV-2 viral copies this assay can detect is 131 copies/mL. A negative result does not preclude SARS-Cov-2 infection and should not be used as the sole basis for treatment or other patient management decisions. A negative result may occur with  improper  specimen collection/handling, submission of specimen other than nasopharyngeal swab, presence of viral mutation(s) within the areas targeted by this assay, and inadequate number of viral copies (<131 copies/mL). A negative result must be combined with clinical observations, patient history, and epidemiological information. The expected result is Negative. Fact Sheet for Patients:  PinkCheek.be Fact Sheet for Healthcare Providers:  GravelBags.it This test is not yet ap proved or cleared by the [place] FDA and  has been authorized for detection and/or diagnosis of SARS-CoV-2 by FDA under an Emergency Use Authorization (EUA). This EUA will remain  in effect (meaning this test can be used) for the duration of the COVID-19 declaration under Section 564(b)(1) of the Act, 21 U.S.C. section 360bbb-3(b)(1), unless the authorization is terminated or revoked sooner.    Influenza A by PCR NEGATIVE NEGATIVE Final   Influenza B by PCR NEGATIVE NEGATIVE Final    Comment: (NOTE) The Xpert Xpress SARS-CoV-2/FLU/RSV assay is intended as an aid in  the diagnosis of influenza from Nasopharyngeal swab specimens and  should not be used as a sole basis for treatment. Nasal washings and  aspirates are unacceptable for Xpert Xpress SARS-CoV-2/FLU/RSV  testing. Fact Sheet for Patients: PinkCheek.be Fact Sheet for Healthcare Providers: GravelBags.it This test is not yet approved or cleared by the [place] FDA and  has been authorized for detection and/or diagnosis of SARS-CoV-2 by  FDA under an Emergency Use Authorization (EUA). This EUA will remain  in effect (meaning this test can be used) for the duration of the  Covid-19 declaration under Section 564(b)(1) of the Act, 21  U.S.C. section 360bbb-3(b)(1), unless the authorization is  terminated or revoked. Performed at [facility], [address] [place], [place] [address]     Coagulation Studies: No results for input(s): LABPROT, INR in the last 72 hours.  Urinalysis: No results for input(s): COLORURINE, LABSPEC, PHURINE, GLUCOSEU, HGBUR, BILIRUBINUR, KETONESUR, PROTEINUR, UROBILINOGEN, NITRITE, LEUKOCYTESUR in the last 72 hours.  Invalid input(s): APPERANCEUR    Imaging: [place] ASCITES (ABDOMEN LIMITED)  Result Date: 07/21/2019 CLINICAL DATA:  Abdominal distension, possible ascites EXAM: LIMITED ABDOMEN ULTRASOUND FOR ASCITES TECHNIQUE: Limited ultrasound survey for ascites was performed in all four abdominal quadrants. COMPARISON:  None. FINDINGS: Minimal ascites is noted. No sizable pocket to allow for safe paracentesis is noted. Small left pleural effusion is noted. IMPRESSION: Minimal ascites. Small left pleural effusion. Electronically Signed   By: Inez Catalina M.D.   On: 07/21/2019 15:07     Medications:   . sodium chloride    . furosemide (LASIX) infusion 8 mg/hr (07/21/19 1215)   . apixaban  2.5 mg Oral BID  . vitamin C  1,000 mg Oral Daily  . cholecalciferol  2,000 Units Oral Daily  . hydrALAZINE  50 mg Oral TID  . insulin aspart  0-20  Units Subcutaneous TID WC  . insulin aspart  6 Units Subcutaneous TID WC  . isosorbide mononitrate  30 mg Oral Daily  . multivitamin with minerals  1 tablet Oral Daily  . neomycin-polymyxin-dexameth  1 application Left Eye BID  . omega-3 acid ethyl esters  1 g Oral Daily  . rosuvastatin  5 mg Oral Once per day on Mon Wed Fri  . silver sulfADIAZINE  1 application Topical BID  . sodium chloride flush  3 mL Intravenous Q12H  . spironolactone  25 mg Oral Daily   sodium chloride, acetaminophen, levalbuterol, nitroGLYCERIN, ondansetron (ZOFRAN) IV, sodium chloride flush  Assessment/ Plan:  [name] is a 79 y.o. white  male with atrial fibrillation, congestive heart failure, hypertension, coronary artery disease, diabetes mellitus type II  who is admitted to [facility] on 07/19/2019 for Acute on chronic diastolic CHF (congestive heart failure) ([place]) [I50.33]  1. Acute Renal Failure: on chronic kidney disease stage IV. Baseline creatinine of 2.29, GFR of 26 on 06/15/19.  Chronic kidney disease with proteinuria and positive ANA. 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  Acute kidney injury secondary to acute cardio-renal syndrome.   2. Acute exacerbation of chronic diastolic congestive heart failure: echocardiogram on 05/08/19 with preserved systolic function.  Home regimen was just changed to torsemide 60mg  bid. However he has failed outpatient management.   3. Hypertension: home regimen of amlodipine, hydralazine and torsemide.   4. Diabetes mellitus type II with chronic kidney disease: insulin dependent. Hemoglobin A1c of 7.2% on 07/19/19.   Plan - Continue furosemide gtt - increase to 8mg /hr - Continue spironolactone - started this admission - Fluid restriction, salt restriction and daily weights.  - Appreciate cardiology input.    LOS: 2 Raiyah Speakman 4/30/20214:33 PM

## 2019-07-21 NOTE — Plan of Care (Signed)
  Problem: Education: Goal: Knowledge of General Education information will improve Description: Including pain rating scale, medication(s)/side effects and non-pharmacologic comfort measures Outcome: Progressing   Problem: Health Behavior/Discharge Planning: Goal: Ability to manage health-related needs will improve Outcome: Not Progressing Note: Lasix drip flow rate doubled today to 8 / hour. Patient for a possible heart catheterization early next week. Thus will be here through the weekend. Will continue to monitor renal / cardiac status for the remainder of the shift. Wenda Low [facility]

## 2019-07-21 NOTE — Care Management Important Message (Signed)
Important Message  Patient Details  Name: Kyle Roberson MRN: [id number] Date of Birth: 03-09-1941   Medicare Important Message Given:  Yes  Initial Medicare IM given by Patient Access Associate on 07/20/2019 at 10:19am.     Dannette Barbara 07/21/2019, 8:25 AM

## 2019-07-21 NOTE — Progress Notes (Signed)
Patient ID: Kyle Roberson, male   DOB: [birth year], 79 y.o.   MRN: [id number] [facility] PROGRESS NOTE  Kyle Roberson YQM:[id number] DOB: 08-19-1940 DOA: 07/19/2019 PCP: Glean Hess, MD  HPI/Subjective: Patient states that he urinated okay but did not lose a lot of weight.  Abdomen still swollen.  Objective: Vitals:   07/21/19 0754 07/21/19 1102  BP: (!) 151/60 136/84  Pulse: 60 (!) 58  Resp: 18 18  Temp: 97.6 F (36.4 C) 97.7 F (36.5 C)  SpO2: 93% 93%    Intake/Output Summary (Last 24 hours) at 07/21/2019 1237 Last data filed at 07/21/2019 1106 Gross per 24 hour  Intake 289.83 ml  Output 1400 ml  Net -1110.17 ml   Filed Weights   07/19/19 1309 07/20/19 0853 07/21/19 0428  Weight: 124.7 kg 123.2 kg 123 kg    ROS: Review of Systems  Constitutional: Negative for fever.  Eyes: Negative for blurred vision.  Respiratory: Positive for shortness of breath. Negative for cough.   Cardiovascular: Negative for chest pain.  Gastrointestinal: Negative for abdominal pain, constipation, diarrhea, nausea and vomiting.  Genitourinary: Negative for dysuria.  Musculoskeletal: Negative for joint pain.  Neurological: Negative for headaches.   Exam: Physical Exam  Constitutional: He is oriented to person, place, and time.  HENT:  Nose: No mucosal edema.  Mouth/Throat: No oropharyngeal exudate or posterior oropharyngeal edema.  Eyes: Conjunctivae and lids are normal.  Neck: Carotid bruit is not present.  Cardiovascular: S1 normal and S2 normal. Exam reveals no gallop.  No murmur heard. Respiratory: No respiratory distress. He has decreased breath sounds in the right lower field and the left lower field. He has no wheezes. He has no rhonchi. He has rales in the right lower field and the left lower field.  GI: Soft. Bowel sounds are normal. He exhibits distension. There is no abdominal tenderness.  Musculoskeletal:     Right knee: Swelling present.     Left knee:  Swelling present.     Right ankle: Swelling present.     Left ankle: Swelling present.  Lymphadenopathy:    He has no cervical adenopathy.  Neurological: He is alert and oriented to person, place, and time. No cranial nerve deficit.  Skin: Skin is warm. Nails show no clubbing.  Chronic lower extremity discoloration  Psychiatric: He has a normal mood and affect.      Data Reviewed: Basic Metabolic Panel: Recent Labs  Lab 07/19/19 1132 07/20/19 0518 07/21/19 0517  NA 144 142 144  K 3.6 3.5 3.8  CL 99 100 100  CO2 34* 31 32  GLUCOSE 79 173* 105*  BUN 56* 57* 57*  CREATININE 2.58* 2.50* 2.54*  CALCIUM 9.2 8.9 9.0   BNP (last 3 results) Recent Labs    04/26/19 1629 05/07/19 1731 07/14/19 1134  BNP 169.0* 165.0* 224.0*    CBG: Recent Labs  Lab 07/20/19 1140 07/20/19 1627 07/20/19 2128 07/21/19 0816 07/21/19 1103  GLUCAP 161* 126* 115* 105* 168*    Scheduled Meds: . apixaban  2.5 mg Oral BID  . vitamin C  1,000 mg Oral Daily  . cholecalciferol  2,000 Units Oral Daily  . hydrALAZINE  50 mg Oral TID  . insulin aspart  0-20 Units Subcutaneous TID WC  . insulin aspart  6 Units Subcutaneous TID WC  . isosorbide mononitrate  30 mg Oral Daily  . multivitamin with minerals  1 tablet Oral Daily  . neomycin-polymyxin-dexameth  1 application Left Eye BID  . omega-3 acid ethyl  esters  1 g Oral Daily  . rosuvastatin  5 mg Oral Once per day on Mon Wed Fri  . silver sulfADIAZINE  1 application Topical BID  . sodium chloride flush  3 mL Intravenous Q12H  . spironolactone  25 mg Oral Daily   Continuous Infusions: . sodium chloride    . furosemide (LASIX) infusion 8 mg/hr (07/21/19 1215)    Assessment/Plan:  1. Acute on chronic diastolic congestive heart failure with anasarca.  The patient had a tremendous amount of weight gain and his baseline weight is between 245 and 250.  Patient is 270 pounds.  Case discussed with cardiology and nephrology and will increase Lasix  drip to 8 units an hour. 2. 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  Hemoglobin A1c 7.2.  Need to watch creatinine closely with diuresis.  Patient on sliding scale insulin.  Glipizide is not a great medication with the patient's kidney function. 3. Chronic atrial fibrillation on Eliquis 4. Hyperlipidemia unspecified on Crestor 5. Essential hypertension.  Currently on hydralazine and spironolactone and Lasix drip.  Norvasc on hold secondary to anasarca 6. Abdominal swelling we will get an abdominal ultrasound to see if there is ascites.  Code Status:     Code Status Orders  (From admission, onward)         Start     Ordered   07/19/19 1309  Full code  Continuous     07/19/19 1314        Code Status History    Date Active Date Inactive Code Status Order ID Comments User Context   05/07/2019 2201 05/10/2019 0208 Full Code [id number]  Athena Masse, MD ED   07/06/2018 2253 07/12/2018 1932 Full Code [id number]  Lance Coon, MD Inpatient   Advance Care Planning Activity    Advance Directive Documentation     Most Recent Value  Type of Advance Directive  Living will, Healthcare Power of Attorney  Pre-existing out of facility DNR order (yellow form or pink MOST form)  --  "MOST" Form in Place?  --     Family Communication: Spoke with [name] on the phone Disposition Plan: Lasix drip increased to 8 mg/h.  Patient is still 20 pounds over his baseline weight.  Cardiology to consider right heart cath but they would have to hold Eliquis if they were to do this.  Consultants:  Cardiology  Nephrology  Time spent: 27 minutes.  Case discussed with cardiology and nephrology  [place]  [facility]

## 2019-07-22 DIAGNOSIS — E7800 Pure hypercholesterolemia, unspecified: Secondary | ICD-10-CM

## 2019-07-22 DIAGNOSIS — R1900 Intra-abdominal and pelvic swelling, mass and lump, unspecified site: Secondary | ICD-10-CM | POA: Diagnosis not present

## 2019-07-22 DIAGNOSIS — I10 Essential (primary) hypertension: Secondary | ICD-10-CM

## 2019-07-22 DIAGNOSIS — I5033 Acute on chronic diastolic (congestive) heart failure: Secondary | ICD-10-CM | POA: Diagnosis not present

## 2019-07-22 DIAGNOSIS — R601 Generalized edema: Secondary | ICD-10-CM | POA: Diagnosis not present

## 2019-07-22 LAB — CBC
HCT: 34.8 % — ABNORMAL LOW (ref 39.0–52.0)
Hemoglobin: 10.4 g/dL — ABNORMAL LOW (ref 13.0–17.0)
MCH: 26.4 pg (ref 26.0–34.0)
MCHC: 29.9 g/dL — ABNORMAL LOW (ref 30.0–36.0)
MCV: 88.3 fL (ref 80.0–100.0)
Platelets: 191 10*3/uL (ref 150–400)
RBC: 3.94 MIL/uL — ABNORMAL LOW (ref 4.22–5.81)
RDW: 15.6 % — ABNORMAL HIGH (ref 11.5–15.5)
WBC: 7.2 10*3/uL (ref 4.0–10.5)
nRBC: 0 % (ref 0.0–0.2)

## 2019-07-22 LAB — MAGNESIUM: Magnesium: 2.3 mg/dL (ref 1.7–2.4)

## 2019-07-22 LAB — BASIC METABOLIC PANEL
Anion gap: 14 (ref 5–15)
BUN: 55 mg/dL — ABNORMAL HIGH (ref 8–23)
CO2: 28 mmol/L (ref 22–32)
Calcium: 8.6 mg/dL — ABNORMAL LOW (ref 8.9–10.3)
Chloride: 98 mmol/L (ref 98–111)
Creatinine, Ser: 2.48 mg/dL — ABNORMAL HIGH (ref 0.61–1.24)
GFR calc Af Amer: 27 mL/min — ABNORMAL LOW (ref >60)
GFR calc non Af Amer: 24 mL/min — ABNORMAL LOW (ref >60)
Glucose, Bld: 115 mg/dL — ABNORMAL HIGH (ref 70–99)
Potassium: 3.7 mmol/L (ref 3.5–5.1)
Sodium: 140 mmol/L (ref 135–145)

## 2019-07-22 MED ORDER — FUROSEMIDE 10 MG/ML IJ SOLN
8.0000 mg/h | INTRAVENOUS | Status: DC
  Administered 2019-07-22 – 2019-07-23 (×2): 10 mg/h via INTRAVENOUS
  Administered 2019-07-25 – 2019-07-27 (×3): 8 mg/h via INTRAVENOUS
  Filled 2019-07-22 (×4): qty 25

## 2019-07-22 MED ORDER — METOLAZONE 2.5 MG PO TABS
2.5000 mg | ORAL_TABLET | Freq: Once | ORAL | Status: AC
  Administered 2019-07-22: 12:00:00 2.5 mg via ORAL
  Filled 2019-07-22: qty 1

## 2019-07-22 NOTE — Progress Notes (Signed)
Patient ID: Kyle Roberson, male   DOB: Jun 05, 1940, 79 y.o.   MRN: [id number] [facility] PROGRESS NOTE  Kyle Roberson GHW:[id number] DOB: 1941/03/11 DOA: 07/19/2019 PCP: Glean Hess, MD  HPI/Subjective: The patient is disappointed that he did not lose any weight and is still 270 pounds.  He states he is urinating very well.  States he still has some shortness of breath.  Objective: Vitals:   07/22/19 1610 07/22/19 1611  BP:  (!) 134/45  Pulse:  (!) 52  Resp:  18  Temp: (!) 97.5 F (36.4 C)   SpO2:  93%    Intake/Output Summary (Last 24 hours) at 07/22/2019 1631 Last data filed at 07/22/2019 0754 Gross per 24 hour  Intake 135.15 ml  Output 700 ml  Net -564.85 ml   Filed Weights   07/20/19 0853 07/21/19 0428 07/22/19 0416  Weight: 123.2 kg 123 kg 122.9 kg    ROS: Review of Systems  Constitutional: Negative for fever.  Eyes: Negative for blurred vision.  Respiratory: Positive for shortness of breath. Negative for cough.   Cardiovascular: Negative for chest pain.  Gastrointestinal: Negative for abdominal pain, nausea and vomiting.  Genitourinary: Negative for dysuria.  Musculoskeletal: Negative for joint pain.  Neurological: Negative for headaches.   Exam: Physical Exam  Constitutional: He is oriented to person, place, and time.  HENT:  Nose: No mucosal edema.  Mouth/Throat: No oropharyngeal exudate or posterior oropharyngeal edema.  Eyes: Conjunctivae and lids are normal.  Neck: Carotid bruit is not present.  Cardiovascular: S1 normal and S2 normal. Exam reveals no gallop.  No murmur heard. Respiratory: No respiratory distress. He has decreased breath sounds in the right lower field and the left lower field. He has no wheezes. He has no rhonchi. He has rales in the right lower field and the left lower field.  GI: Soft. Bowel sounds are normal. He exhibits distension. There is no abdominal tenderness.  Musculoskeletal:     Right knee: Swelling  present.     Left knee: Swelling present.     Right ankle: Swelling present.     Left ankle: Swelling present.  Lymphadenopathy:    He has no cervical adenopathy.  Neurological: He is alert and oriented to person, place, and time. No cranial nerve deficit.  Skin: Skin is warm. Nails show no clubbing.  Chronic lower extremity discoloration  Psychiatric: He has a normal mood and affect.      Data Reviewed: Basic Metabolic Panel: Recent Labs  Lab 07/19/19 1132 07/20/19 0518 07/21/19 0517 07/22/19 0514  NA 144 142 144 140  K 3.6 3.5 3.8 3.7  CL 99 100 100 98  CO2 34* 31 32 28  GLUCOSE 79 173* 105* 115*  BUN 56* 57* 57* 55*  CREATININE 2.58* 2.50* 2.54* 2.48*  CALCIUM 9.2 8.9 9.0 8.6*  MG  --   --   --  2.3   BNP (last 3 results) Recent Labs    04/26/19 1629 05/07/19 1731 07/14/19 1134  BNP 169.0* 165.0* 224.0*    CBG: Recent Labs  Lab 07/20/19 1140 07/20/19 1627 07/20/19 2128 07/21/19 0816 07/21/19 1103  GLUCAP 161* 126* 115* 105* 168*    Scheduled Meds: . apixaban  2.5 mg Oral BID  . vitamin C  1,000 mg Oral Daily  . cholecalciferol  2,000 Units Oral Daily  . hydrALAZINE  50 mg Oral TID  . insulin aspart  0-20 Units Subcutaneous TID WC  . insulin aspart  6 Units Subcutaneous TID WC  .  isosorbide mononitrate  30 mg Oral Daily  . multivitamin with minerals  1 tablet Oral Daily  . neomycin-polymyxin-dexameth  1 application Left Eye BID  . omega-3 acid ethyl esters  1 g Oral Daily  . rosuvastatin  5 mg Oral Once per day on Mon Wed Fri  . silver sulfADIAZINE  1 application Topical BID  . sodium chloride flush  3 mL Intravenous Q12H  . spironolactone  25 mg Oral Daily   Continuous Infusions: . sodium chloride    . furosemide (LASIX) infusion 10 mg/hr (07/22/19 1558)    Assessment/Plan:  1. Acute on chronic diastolic congestive heart failure with anasarca.  The patient had a tremendous amount of weight gain and his baseline weight is between 245 and  250.  Patient is 270 pounds again today.  Cardiology increase Lasix drip to 10 mg an hour and give a dose of metolazone.  Wondering if this could be right heart failure with increased pulmonary pressures seen on echocardiogram.  With bradycardia unable to do beta-blocker. 2. 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  Hemoglobin A1c 7.2.  Need to watch creatinine closely with diuresis.  Patient on sliding scale insulin.  Glipizide is not a great medication with the patient's kidney function.  May be able to do Tradjenta as outpatient. 3. Chronic atrial fibrillation on Eliquis 4. Hyperlipidemia unspecified on Crestor 5. Essential hypertension.  Currently on hydralazine and spironolactone and Lasix drip.  Norvasc on hold secondary to anasarca 6. Abdominal swelling.  Not enough ascites to draw  Code Status:     Code Status Orders  (From admission, onward)         Start     Ordered   07/19/19 1309  Full code  Continuous     07/19/19 1314        Code Status History    Date Active Date Inactive Code Status Order ID Comments User Context   05/07/2019 2201 05/10/2019 0208 Full Code [id number]  Athena Masse, MD ED   07/06/2018 2253 07/12/2018 1932 Full Code [id number]  Lance Coon, MD Inpatient   Advance Care Planning Activity    Advance Directive Documentation     Most Recent Value  Type of Advance Directive  Living will, Healthcare Power of Attorney  Pre-existing out of facility DNR order (yellow form or pink MOST form)  --  "MOST" Form in Place?  --     Family Communication: Spoke with [name] on the phone Disposition Plan: Lasix drip increased to 10 mg/h.  Patient is still 20 pounds over his baseline weight.  Need to get some more weight loss and diuresis prior to disposition likely will need a few more days here in the hospital at least.  Consultants:  Cardiology  Nephrology  Time spent: 27 minutes.  Case discussed with cardiology PA and nephrology  [place]  [facility]

## 2019-07-22 NOTE — Progress Notes (Addendum)
Patient refusing bed alarm stating he has been ambulating to the bathroom without any assistance and has not fallen. Patient agrees to call out if assistance needed.   Verbal orders from Dr. Candiss Norse to continue lasix gtt at 56ml/hr.

## 2019-07-22 NOTE — Progress Notes (Signed)
Progress Note  Patient Name: Kyle Roberson Date of Encounter: 07/22/2019  Primary Cardiologist: End  Subjective   Dyspnea is unchanged over the past 24 hours. Orthopnea persists. No chest pain. Documented UOP 889 mL for the past 24 hours with a net - 2.9 L for the admission. Weight 123-->122.9 kg over the past 24 hours on higher dose Lasix gtt of 8 mg/hr.  He was quite disheartened to see essentially no change in his weight over the past 24 hours.  BUN/SCr 57/2.54-->55/2.48.   Inpatient Medications    Scheduled Meds: . apixaban  2.5 mg Oral BID  . vitamin C  1,000 mg Oral Daily  . cholecalciferol  2,000 Units Oral Daily  . hydrALAZINE  50 mg Oral TID  . insulin aspart  0-20 Units Subcutaneous TID WC  . insulin aspart  6 Units Subcutaneous TID WC  . isosorbide mononitrate  30 mg Oral Daily  . multivitamin with minerals  1 tablet Oral Daily  . neomycin-polymyxin-dexameth  1 application Left Eye BID  . omega-3 acid ethyl esters  1 g Oral Daily  . rosuvastatin  5 mg Oral Once per day on Mon Wed Fri  . silver sulfADIAZINE  1 application Topical BID  . sodium chloride flush  3 mL Intravenous Q12H  . spironolactone  25 mg Oral Daily   Continuous Infusions: . sodium chloride    . furosemide (LASIX) infusion 8 mg/hr (07/21/19 1215)   PRN Meds: sodium chloride, acetaminophen, levalbuterol, nitroGLYCERIN, ondansetron (ZOFRAN) IV, sodium chloride flush   Vital Signs    Vitals:   07/21/19 1624 07/21/19 2049 07/22/19 0416 07/22/19 0419  BP: 125/66 (!) 130/94  (!) 142/60  Pulse: (!) 56 (!) 54  63  Resp: 18   20  Temp:  (!) 97.5 F (36.4 C)  97.9 F (36.6 C)  TempSrc:  Oral    SpO2: 91% 92%  91%  Weight:   122.9 kg     Intake/Output Summary (Last 24 hours) at 07/22/2019 0732 Last data filed at 07/22/2019 0300 Gross per 24 hour  Intake 135.15 ml  Output 1025 ml  Net -889.85 ml   Filed Weights   07/20/19 0853 07/21/19 0428 07/22/19 0416  Weight: 123.2 kg 123 kg 122.9 kg      Telemetry    Afib with ventricular rates in the 50s to 80s bpm, rare PVC - Personally Reviewed  ECG    No new tracings - Personally Reviewed  Physical Exam   GEN: No acute distress. Left eye injected.  Neck: JVD difficult to assess secondary to body habitus. Cardiac: RRR, no murmurs, rubs, or gallops.  Respiratory: Diminished and coarse breath sounds bilaterally.  GI: Soft, nontender, non-distended.   MS: 2+ bilateral lower extremity pitting edema to the thighs; No deformity. Neuro:  Alert and oriented x 3; Nonfocal.  Psych: Normal affect.  Labs    Chemistry Recent Labs  Lab 07/20/19 0518 07/21/19 0517 07/22/19 0514  NA 142 144 140  K 3.5 3.8 3.7  CL 100 100 98  CO2 31 32 28  GLUCOSE 173* 105* 115*  BUN 57* 57* 55*  CREATININE 2.50* 2.54* 2.48*  CALCIUM 8.9 9.0 8.6*  GFRNONAA 23* 23* 24*  GFRAA 27* 27* 27*  ANIONGAP 11 12 14      Hematology Recent Labs  Lab 07/22/19 0514  WBC 7.2  RBC 3.94*  HGB 10.4*  HCT 34.8*  MCV 88.3  MCH 26.4  MCHC 29.9*  RDW 15.6*  PLT 191  Cardiac EnzymesNo results for input(s): TROPONINI in the last 168 hours. No results for input(s): TROPIPOC in the last 168 hours.   BNP No results for input(s): BNP, PROBNP in the last 168 hours.   DDimer No results for input(s): DDIMER in the last 168 hours.   Radiology    [place] ASCITES (ABDOMEN LIMITED)  Result Date: 07/21/2019 CLINICAL DATA:  Abdominal distension, possible ascites EXAM: LIMITED ABDOMEN ULTRASOUND FOR ASCITES TECHNIQUE: Limited ultrasound survey for ascites was performed in all four abdominal quadrants. COMPARISON:  None. FINDINGS: Minimal ascites is noted. No sizable pocket to allow for safe paracentesis is noted. Small left pleural effusion is noted. IMPRESSION: Minimal ascites. Small left pleural effusion. Electronically Signed   By: Inez Catalina M.D.   On: 07/21/2019 15:07    Cardiac Studies   2D echo 04/2019: 1. Left ventricular ejection fraction, by  estimation, is 55 to 60%. The  left ventricle has normal function. The left ventricle has no regional  wall motion abnormalities. There is mild left ventricular hypertrophy.  Left ventricular diastolic parameters  are indeterminate.  2. Right ventricular systolic function is normal. The right ventricular  size is normal. There is moderately elevated pulmonary artery systolic  pressure. The estimated right ventricular systolic pressure is 87.5 mmHg.  3. Left atrial size was moderately dilated.  4. The mitral valve is normal in structure and function. Mild to moderate  mitral valve regurgitation. No evidence of mitral stenosis.  5. The aortic valve is normal in structure and function. Aortic valve  regurgitation is mild. Mild aortic valve sclerosis is present, with no  evidence of aortic valve stenosis.  6. Moderately dilated pulmonary artery.  7. The inferior vena cava is dilated in size with <50% respiratory  variability, suggesting right atrial pressure of 15 mmHg.   Patient Profile     79 y.o. male with history of CAD, persistent Afib, HFpEF, pulmonary hypertension, DM2, HTN, HLD, CKD stage IV, hyperparathyroidism, and GERD who we are seeing for acute on chronic HFpEF.   Assessment & Plan    1. Acute on chronic HFpEF/pulmonary hypertension: -Volume status remains significantly elevated -He has been transitioned from IV push Lasix to Lasix gtt in an effort to augment diuresis with preservation of renal function -Documented UOP is still ~ 1 L over the past 24 hours with a net - 2.9 L for the admission -Continue Lasix gtt 8 mg/hr, may need to augment with metolazone 2.5 mg -He is declining RHC at this time secondary to a friend who underwent cardiac cath in the past and had never uttered a crossword previously though after his cath he reports this friend "cussed like a sailor" -He remains on spironolactone, monitor with underlying CKD -Continue Imdur secondary to mildly elevated  BP -Hydralazine  -He has an appointment with the advanced heart failure clinic 5/6 -Amlodipine has been held secondary to lower extremity swelling  2. 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 Persistent Afib: -Ventricular rates are well controlled, not on rate controlling medications -Given his refusal for RHC we will continue him on Eliquis at this time -CHADS2VASc at least 6 (CHF, HTN, age x 2, DM, vascular disease) -Cannot exclude brief episode of junctional rhythm on telemetry  4. 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 HTN: -BP in the 340Z systolic this morning -Medications as above  6. HLD: -Tolerating Crestor -LDL 73 from 07/2018   For questions or updates, please contact [place] Please consult [email or web address] for contact info under Cardiology/STEMI.    Signed, Christell Faith, PA-C [place] Pager: [phone] 07/22/2019, 7:32 AM

## 2019-07-22 NOTE — Progress Notes (Signed)
[facility]  ROUNDING NOTE   Subjective:   States that he feels rough today Sitting up on the side of the bed Reports that urine output is good Appetite is fair Continues to have large amount of edema over lower abdomen and both legs  Objective:  Vital signs in last 24 hours:  Temp:  [97.5 F (36.4 C)-97.9 F (36.6 C)] 97.5 F (36.4 C) (05/01 0754) Pulse Rate:  [54-63] 62 (05/01 0754) Resp:  [17-20] 17 (05/01 0754) BP: (125-144)/(60-94) 144/92 (05/01 0754) SpO2:  [91 %-93 %] 91 % (05/01 0754) Weight:  [122.9 kg] 122.9 kg (05/01 0416)  Weight change: -0.227 kg Filed Weights   07/20/19 0853 07/21/19 0428 07/22/19 0416  Weight: 123.2 kg 123 kg 122.9 kg    Intake/Output: I/O last 3 completed shifts: In: 184.4 [I.V.:184.4] Out: 1575 [Urine:1575]   Intake/Output this shift:  No intake/output data recorded.  Physical Exam: General:  Sitting up on the side of the bed, no acute distress  Head:  Normocephalic  Eyes:  Anicteric  Neck:  Supple, no JVD  Lungs:   Mild basilar crackles bilaterally, room air  Heart:  No rub  Abdomen:   Soft, distended  Extremities:  3+ pitting edema over legs and lower abdomen  Neurologic:  Alert, oriented  Skin:  Congestive hyperemia over the legs       Basic Metabolic Panel: Recent Labs  Lab 07/19/19 1132 07/19/19 1132 07/20/19 0518 07/21/19 0517 07/22/19 0514  NA 144  --  142 144 140  K 3.6  --  3.5 3.8 3.7  CL 99  --  100 100 98  CO2 34*  --  31 32 28  GLUCOSE 79  --  173* 105* 115*  BUN 56*  --  57* 57* 55*  CREATININE 2.58*  --  2.50* 2.54* 2.48*  CALCIUM 9.2   < > 8.9 9.0 8.6*  MG  --   --   --   --  2.3   < > = values in this interval not displayed.    Liver Function Tests: No results for input(s): AST, ALT, ALKPHOS, BILITOT, PROT, ALBUMIN in the last 168 hours. No results for input(s): LIPASE, AMYLASE in the last 168 hours. No results for input(s): AMMONIA in the last 168 hours.  CBC: Recent Labs   Lab 07/22/19 0514  WBC 7.2  HGB 10.4*  HCT 34.8*  MCV 88.3  PLT 191    Cardiac Enzymes: No results for input(s): CKTOTAL, CKMB, CKMBINDEX, TROPONINI in the last 168 hours.  BNP: Invalid input(s): POCBNP  CBG: Recent Labs  Lab 07/20/19 1140 07/20/19 1627 07/20/19 2128 07/21/19 0816 07/21/19 1103  GLUCAP 161* 126* 115* 105* 168*    Microbiology: Results for orders placed or performed during the hospital encounter of 05/07/19  Blood Culture (routine x 2)     Status: None   Collection Time: 05/07/19  6:45 PM   Specimen: BLOOD  Result Value Ref Range Status   Specimen Description BLOOD R AC  Final   Special Requests   Final    BOTTLES DRAWN AEROBIC AND ANAEROBIC Blood Culture results may not be optimal due to an excessive volume of blood received in culture bottles   Culture   Final    NO GROWTH 5 DAYS Performed at [facility], [address], [place],  [address]    Report Status 05/12/2019 FINAL  Final  Blood Culture (routine x 2)     Status: None  Collection Time: 05/07/19  6:45 PM   Specimen: BLOOD  Result Value Ref Range Status   Specimen Description BLOOD R HAND  Final   Special Requests   Final    BOTTLES DRAWN AEROBIC AND ANAEROBIC Blood Culture results may not be optimal due to an inadequate volume of blood received in culture bottles   Culture   Final    NO GROWTH 5 DAYS Performed at [facility], [address], [place], [place] [address]    Report Status 05/12/2019 FINAL  Final  Respiratory Panel by RT PCR (Flu A&B, Covid) - Nasopharyngeal Swab     Status: None   Collection Time: 05/07/19 11:58 PM   Specimen: Nasopharyngeal Swab  Result Value Ref Range Status   SARS Coronavirus 2 by RT PCR NEGATIVE NEGATIVE Final    Comment: (NOTE) SARS-CoV-2 target nucleic acids are NOT DETECTED. The SARS-CoV-2 RNA is generally detectable in upper respiratoy specimens during the acute phase of infection. The lowest concentration of  SARS-CoV-2 viral copies this assay can detect is 131 copies/mL. A negative result does not preclude SARS-Cov-2 infection and should not be used as the sole basis for treatment or other patient management decisions. A negative result may occur with  improper specimen collection/handling, submission of specimen other than nasopharyngeal swab, presence of viral mutation(s) within the areas targeted by this assay, and inadequate number of viral copies (<131 copies/mL). A negative result must be combined with clinical observations, patient history, and epidemiological information. The expected result is Negative. Fact Sheet for Patients:  PinkCheek.be Fact Sheet for Healthcare Providers:  GravelBags.it This test is not yet ap proved or cleared by the [place] FDA and  has been authorized for detection and/or diagnosis of SARS-CoV-2 by FDA under an Emergency Use Authorization (EUA). This EUA will remain  in effect (meaning this test can be used) for the duration of the COVID-19 declaration under Section 564(b)(1) of the Act, 21 U.S.C. section 360bbb-3(b)(1), unless the authorization is terminated or revoked sooner.    Influenza A by PCR NEGATIVE NEGATIVE Final   Influenza B by PCR NEGATIVE NEGATIVE Final    Comment: (NOTE) The Xpert Xpress SARS-CoV-2/FLU/RSV assay is intended as an aid in  the diagnosis of influenza from Nasopharyngeal swab specimens and  should not be used as a sole basis for treatment. Nasal washings and  aspirates are unacceptable for Xpert Xpress SARS-CoV-2/FLU/RSV  testing. Fact Sheet for Patients: PinkCheek.be Fact Sheet for Healthcare Providers: GravelBags.it This test is not yet approved or cleared by the [place] FDA and  has been authorized for detection and/or diagnosis of SARS-CoV-2 by  FDA under an Emergency Use Authorization (EUA).  This EUA will remain  in effect (meaning this test can be used) for the duration of the  Covid-19 declaration under Section 564(b)(1) of the Act, 21  U.S.C. section 360bbb-3(b)(1), unless the authorization is  terminated or revoked. Performed at [facility], [address] [place],  [address]     Coagulation Studies: No results for input(s): LABPROT, INR in the last 72 hours.  Urinalysis: No results for input(s): COLORURINE, LABSPEC, PHURINE, GLUCOSEU, HGBUR, BILIRUBINUR, KETONESUR, PROTEINUR, UROBILINOGEN, NITRITE, LEUKOCYTESUR in the last 72 hours.  Invalid input(s): APPERANCEUR    Imaging: [place] ASCITES (ABDOMEN LIMITED)  Result Date: 07/21/2019 CLINICAL DATA:  Abdominal distension, possible ascites EXAM: LIMITED ABDOMEN ULTRASOUND FOR ASCITES TECHNIQUE: Limited ultrasound survey for ascites was performed in all four abdominal quadrants. COMPARISON:  None. FINDINGS: Minimal ascites is noted.  No sizable pocket to allow for safe paracentesis is noted. Small left pleural effusion is noted. IMPRESSION: Minimal ascites. Small left pleural effusion. Electronically Signed   By: Inez Catalina M.D.   On: 07/21/2019 15:07     Medications:   . sodium chloride    . furosemide (LASIX) infusion 8 mg/hr (07/21/19 1215)   . apixaban  2.5 mg Oral BID  . vitamin C  1,000 mg Oral Daily  . cholecalciferol  2,000 Units Oral Daily  . hydrALAZINE  50 mg Oral TID  . insulin aspart  0-20 Units Subcutaneous TID WC  . insulin aspart  6 Units Subcutaneous TID WC  . isosorbide mononitrate  30 mg Oral Daily  . multivitamin with minerals  1 tablet Oral Daily  . neomycin-polymyxin-dexameth  1 application Left Eye BID  . omega-3 acid ethyl esters  1 g Oral Daily  . rosuvastatin  5 mg Oral Once per day on Mon Wed Fri  . silver sulfADIAZINE  1 application Topical BID  . sodium chloride flush  3 mL Intravenous Q12H  . spironolactone  25 mg Oral Daily   sodium chloride, acetaminophen,  levalbuterol, nitroGLYCERIN, ondansetron (ZOFRAN) IV, sodium chloride flush  Assessment/ Plan:  [name] is a 79 y.o. white  male with atrial fibrillation, congestive heart failure, hypertension, coronary artery disease, diabetes mellitus type II who is admitted to [facility] on 07/19/2019 for Acute on chronic diastolic CHF (congestive heart failure) ([place]) [I50.33]  1. Acute Renal Failure: on chronic kidney disease stage IV. Baseline creatinine of 2.29, GFR of 26 on 06/15/19.  Chronic kidney disease with proteinuria and positive ANA. 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  Acute kidney injury secondary to acute cardio-renal syndrome.  Lab Results  Component Value Date   CREATININE 2.48 (H) 07/22/2019   CREATININE 2.54 (H) 07/21/2019   CREATININE 2.50 (H) 07/20/2019   04/30 0701 - 05/01 0700 In: 135.2 [I.V.:135.2] Out: 1025 [Urine:1025]   2. Acute exacerbation of chronic diastolic congestive heart failure:  Pulmonary HTN Severe lower extremity edema and lower abdominal edema echocardiogram on 05/08/19 with preserved systolic function.  2D echo from February 15 shows LVEF 55 to 60%, normal LV function, mild LVH, indeterminate left ventricular diastolic parameters Moderately elevated pulmonary artery systolic pressure of 56 mm, moderately dilated pulmonary artery   3. Hypertension:  BP Readings from Last 3 Encounters:  07/22/19 (!) 144/92  07/19/19 126/60  07/19/19 134/70     4. Diabetes mellitus type II with chronic kidney disease: insulin dependent.  Lab Results  Component Value Date   HGBA1C 7.2 (H) 07/19/2019    Plan - Continue furosemide gtt -at increased rate of 8mg /hr - Discussed with patient that if medical treatments fail, dialysis is a possibility - Continue spironolactone - started this admission - Fluid restriction, salt restriction and daily standing weights.  -May need evaluation for sleep apnea as outpatient    LOS: 3 Sharnay Cashion 5/1/20218:45 AM

## 2019-07-23 DIAGNOSIS — I2510 Atherosclerotic heart disease of native coronary artery without angina pectoris: Secondary | ICD-10-CM | POA: Diagnosis not present

## 2019-07-23 DIAGNOSIS — R1900 Intra-abdominal and pelvic swelling, mass and lump, unspecified site: Secondary | ICD-10-CM | POA: Diagnosis not present

## 2019-07-23 DIAGNOSIS — I5033 Acute on chronic diastolic (congestive) heart failure: Secondary | ICD-10-CM | POA: Diagnosis not present

## 2019-07-23 DIAGNOSIS — I482 Chronic atrial fibrillation, unspecified: Secondary | ICD-10-CM | POA: Diagnosis not present

## 2019-07-23 LAB — BASIC METABOLIC PANEL
Anion gap: 13 (ref 5–15)
BUN: 61 mg/dL — ABNORMAL HIGH (ref 8–23)
CO2: 31 mmol/L (ref 22–32)
Calcium: 8.8 mg/dL — ABNORMAL LOW (ref 8.9–10.3)
Chloride: 96 mmol/L — ABNORMAL LOW (ref 98–111)
Creatinine, Ser: 2.64 mg/dL — ABNORMAL HIGH (ref 0.61–1.24)
GFR calc Af Amer: 25 mL/min — ABNORMAL LOW (ref >60)
GFR calc non Af Amer: 22 mL/min — ABNORMAL LOW (ref >60)
Glucose, Bld: 136 mg/dL — ABNORMAL HIGH (ref 70–99)
Potassium: 3.6 mmol/L (ref 3.5–5.1)
Sodium: 140 mmol/L (ref 135–145)

## 2019-07-23 MED ORDER — HYDRALAZINE HCL 50 MG PO TABS
100.0000 mg | ORAL_TABLET | Freq: Three times a day (TID) | ORAL | Status: DC
  Administered 2019-07-23 – 2019-07-28 (×15): 100 mg via ORAL
  Filled 2019-07-23 (×16): qty 2

## 2019-07-23 MED ORDER — POLYETHYLENE GLYCOL 3350 17 G PO PACK
17.0000 g | PACK | Freq: Every day | ORAL | Status: DC
  Administered 2019-07-23 – 2019-07-27 (×3): 17 g via ORAL
  Filled 2019-07-23 (×3): qty 1

## 2019-07-23 MED ORDER — ISOSORBIDE MONONITRATE ER 60 MG PO TB24
60.0000 mg | ORAL_TABLET | Freq: Every day | ORAL | Status: DC
  Administered 2019-07-24 – 2019-07-27 (×4): 60 mg via ORAL
  Filled 2019-07-23 (×4): qty 1

## 2019-07-23 NOTE — Progress Notes (Signed)
Patient ID: Kyle Roberson, male   DOB: [birth year], 79 y.o.   MRN: [id number] [facility] PROGRESS NOTE  Reznor Ferrando JJO:[id number] DOB: [birth year] DOA: 07/19/2019 PCP: Glean Hess, MD  HPI/Subjective: Patient is frustrated that his weight is pretty much the same.  Still very swollen but able to press and on his calves a little bit more than previous.  Still with a little shortness of breath.  No pain.  Patient had a good bowel movement today.  Objective: Vitals:   07/23/19 0721 07/23/19 1134  BP: 139/72 137/61  Pulse: 65 (!) 55  Resp: 17 17  Temp: 97.9 F (36.6 C) 97.8 F (36.6 C)  SpO2: 93% 94%    Intake/Output Summary (Last 24 hours) at 07/23/2019 1448 Last data filed at 07/23/2019 1218 Gross per 24 hour  Intake 721.98 ml  Output 3825 ml  Net -3103.02 ml   Filed Weights   07/21/19 0428 07/22/19 0416 07/23/19 0440  Weight: 123 kg 122.9 kg 123.2 kg    ROS: Review of Systems  Constitutional: Negative for fever.  Eyes: Negative for blurred vision.  Respiratory: Positive for shortness of breath. Negative for cough.   Cardiovascular: Negative for chest pain.  Gastrointestinal: Negative for abdominal pain, nausea and vomiting.  Genitourinary: Negative for dysuria.  Musculoskeletal: Negative for joint pain.  Neurological: Negative for headaches.   Exam: Physical Exam  Constitutional: He is oriented to person, place, and time.  HENT:  Nose: No mucosal edema.  Mouth/Throat: No oropharyngeal exudate or posterior oropharyngeal edema.  Eyes: Conjunctivae and lids are normal.  Neck: Carotid bruit is not present.  Cardiovascular: S1 normal and S2 normal. Exam reveals no gallop.  No murmur heard. Respiratory: No respiratory distress. He has decreased breath sounds in the right lower field and the left lower field. He has no wheezes. He has no rhonchi. He has rales in the right lower field and the left lower field.  GI: Soft. Bowel sounds are normal. He  exhibits distension. There is no abdominal tenderness.  Musculoskeletal:     Right knee: Swelling present.     Left knee: Swelling present.     Right ankle: Swelling present.     Left ankle: Swelling present.  Lymphadenopathy:    He has no cervical adenopathy.  Neurological: He is alert and oriented to person, place, and time. No cranial nerve deficit.  Skin: Skin is warm. Nails show no clubbing.  Chronic lower extremity discoloration  Psychiatric: He has a normal mood and affect.      Data Reviewed: Basic Metabolic Panel: Recent Labs  Lab 07/19/19 1132 07/20/19 0518 07/21/19 0517 07/22/19 0514 07/23/19 0553  NA 144 142 144 140 140  K 3.6 3.5 3.8 3.7 3.6  CL 99 100 100 98 96*  CO2 34* 31 32 28 31  GLUCOSE 79 173* 105* 115* 136*  BUN 56* 57* 57* 55* 61*  CREATININE 2.58* 2.50* 2.54* 2.48* 2.64*  CALCIUM 9.2 8.9 9.0 8.6* 8.8*  MG  --   --   --  2.3  --    BNP (last 3 results) Recent Labs    04/26/19 1629 05/07/19 1731 07/14/19 1134  BNP 169.0* 165.0* 224.0*    CBG: Recent Labs  Lab 07/20/19 1140 07/20/19 1627 07/20/19 2128 07/21/19 0816 07/21/19 1103  GLUCAP 161* 126* 115* 105* 168*    Scheduled Meds: . apixaban  2.5 mg Oral BID  . vitamin C  1,000 mg Oral Daily  . cholecalciferol  2,000 Units Oral  Daily  . hydrALAZINE  100 mg Oral TID  . insulin aspart  0-20 Units Subcutaneous TID WC  . insulin aspart  6 Units Subcutaneous TID WC  . [START ON 07/24/2019] isosorbide mononitrate  60 mg Oral Daily  . multivitamin with minerals  1 tablet Oral Daily  . neomycin-polymyxin-dexameth  1 application Left Eye BID  . omega-3 acid ethyl esters  1 g Oral Daily  . polyethylene glycol  17 g Oral Daily  . rosuvastatin  5 mg Oral Once per day on Mon Wed Fri  . silver sulfADIAZINE  1 application Topical BID  . sodium chloride flush  3 mL Intravenous Q12H  . spironolactone  25 mg Oral Daily   Continuous Infusions: . sodium chloride    . furosemide (LASIX) infusion  10 mg/hr (07/22/19 1711)    Assessment/Plan:  1. Acute on chronic diastolic congestive heart failure with anasarca.  The patient had a tremendous amount of weight gain and his baseline weight is between 245 and 250.  Patient is 271 pounds again today.  Continue Lasix drip to 10 mg an hour.  Patient is urinating well despite not losing weight.  Wondering if this could be right heart failure with increased pulmonary pressures seen on echocardiogram.  With bradycardia unable to do beta-blocker. 2. 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  Hemoglobin A1c 7.2.  Need to watch creatinine closely with diuresis.  Creatinine started to creep up a little bit to 2.64.  Patient on sliding scale insulin.  Glipizide is not a great medication with the patient's kidney function.  May be able to do Tradjenta as outpatient. 3. Chronic atrial fibrillation on Eliquis 4. Hyperlipidemia unspecified on Crestor 5. Essential hypertension.  Currently on hydralazine and spironolactone and Lasix drip.  Norvasc on hold secondary to anasarca 6. Abdominal swelling.  Not enough fluid in the abdomen to do a paracentesis.  Code Status:     Code Status Orders  (From admission, onward)         Start     Ordered   07/19/19 1309  Full code  Continuous     07/19/19 1314        Code Status History    Date Active Date Inactive Code Status Order ID Comments User Context   05/07/2019 2201 05/10/2019 0208 Full Code [id number]  Athena Masse, MD ED   07/06/2018 2253 07/12/2018 1932 Full Code [id number]  Lance Coon, MD Inpatient   Advance Care Planning Activity    Advance Directive Documentation     Most Recent Value  Type of Advance Directive  Living will, Healthcare Power of Attorney  Pre-existing out of facility DNR order (yellow form or pink MOST form)  -  "MOST" Form in Place?  -     Family Communication: Spoke with [name] on the phone Disposition Plan: Lasix drip at 10 mg/h.  Patient is still 20  pounds over his baseline weight.  Patient is urinating very well on the Lasix drip and hoping to see the weight start to come down because it has been stable for 3 days.  Will need to be closer to his baseline weight prior to any disposition home.  Consultants:  Cardiology  Nephrology  Time spent: 27 minutes.  Case discussed with nephrology  Loletha Grayer  [facility]

## 2019-07-23 NOTE — Progress Notes (Signed)
[facility]  ROUNDING NOTE   Subjective:   Sitting up on the side of the bed Reports that urine output is good Appetite is fair Continues to have large amount of edema over lower abdomen and both legs  Objective:  Vital signs in last 24 hours:  Temp:  [97.5 F (36.4 C)-98.5 F (36.9 C)] 97.9 F (36.6 C) (05/02 0721) Pulse Rate:  [52-65] 65 (05/02 0721) Resp:  [17-20] 17 (05/02 0721) BP: (128-153)/(45-72) 139/72 (05/02 0721) SpO2:  [93 %-95 %] 93 % (05/02 0721) Weight:  [123.2 kg] 123.2 kg (05/02 0440)  Weight change: 0.272 kg Filed Weights   07/21/19 0428 07/22/19 0416 07/23/19 0440  Weight: 123 kg 122.9 kg 123.2 kg    Intake/Output: I/O last 3 completed shifts: In: 494.1 [P.O.:240; I.V.:254.1] Out: 3225 [Urine:3225]   Intake/Output this shift:  Total I/O In: 3 [I.V.:3] Out: 800 [Urine:800]  Physical Exam: General:  Sitting up on the side of the bed, no acute distress  Head:  Normocephalic  Eyes:  Anicteric  Neck:  Supple, no JVD  Lungs:   Mild basilar crackles bilaterally, room air  Heart:  No rub  Abdomen:   Soft, distended  Extremities:  3+ pitting edema over legs and lower abdomen  Neurologic:  Alert, oriented  Skin:  Congestive hyperemia over the legs       Basic Metabolic Panel: Recent Labs  Lab 07/19/19 1132 07/19/19 1132 07/20/19 0518 07/20/19 0518 07/21/19 0517 07/22/19 0514 07/23/19 0553  NA 144  --  142  --  144 140 140  K 3.6  --  3.5  --  3.8 3.7 3.6  CL 99  --  100  --  100 98 96*  CO2 34*  --  31  --  32 28 31  GLUCOSE 79  --  173*  --  105* 115* 136*  BUN 56*  --  57*  --  57* 55* 61*  CREATININE 2.58*  --  2.50*  --  2.54* 2.48* 2.64*  CALCIUM 9.2   < > 8.9   < > 9.0 8.6* 8.8*  MG  --   --   --   --   --  2.3  --    < > = values in this interval not displayed.    Liver Function Tests: No results for input(s): AST, ALT, ALKPHOS, BILITOT, PROT, ALBUMIN in the last 168 hours. No results for input(s): LIPASE,  AMYLASE in the last 168 hours. No results for input(s): AMMONIA in the last 168 hours.  CBC: Recent Labs  Lab 07/22/19 0514  WBC 7.2  HGB 10.4*  HCT 34.8*  MCV 88.3  PLT 191    Cardiac Enzymes: No results for input(s): CKTOTAL, CKMB, CKMBINDEX, TROPONINI in the last 168 hours.  BNP: Invalid input(s): POCBNP  CBG: Recent Labs  Lab 07/20/19 1140 07/20/19 1627 07/20/19 2128 07/21/19 0816 07/21/19 1103  GLUCAP 161* 126* 115* 105* 168*    Microbiology: Results for orders placed or performed during the hospital encounter of 05/07/19  Blood Culture (routine x 2)     Status: None   Collection Time: 05/07/19  6:45 PM   Specimen: BLOOD  Result Value Ref Range Status   Specimen Description BLOOD R AC  Final   Special Requests   Final    BOTTLES DRAWN AEROBIC AND ANAEROBIC Blood Culture results may not be optimal due to an excessive volume of blood received in culture bottles   Culture   Final  NO GROWTH 5 DAYS Performed at [facility], [address] [place], [place] [address]    Report Status 05/12/2019 FINAL  Final  Blood Culture (routine x 2)     Status: None   Collection Time: 05/07/19  6:45 PM   Specimen: BLOOD  Result Value Ref Range Status   Specimen Description BLOOD R HAND  Final   Special Requests   Final    BOTTLES DRAWN AEROBIC AND ANAEROBIC Blood Culture results may not be optimal due to an inadequate volume of blood received in culture bottles   Culture   Final    NO GROWTH 5 DAYS Performed at [facility], [address], [place], [place] [address]    Report Status 05/12/2019 FINAL  Final  Respiratory Panel by RT PCR (Flu A&B, Covid) - Nasopharyngeal Swab     Status: None   Collection Time: 05/07/19 11:58 PM   Specimen: Nasopharyngeal Swab  Result Value Ref Range Status   SARS Coronavirus 2 by RT PCR NEGATIVE NEGATIVE Final    Comment: (NOTE) SARS-CoV-2 target nucleic acids are NOT DETECTED. The SARS-CoV-2 RNA is generally  detectable in upper respiratoy specimens during the acute phase of infection. The lowest concentration of SARS-CoV-2 viral copies this assay can detect is 131 copies/mL. A negative result does not preclude SARS-Cov-2 infection and should not be used as the sole basis for treatment or other patient management decisions. A negative result may occur with  improper specimen collection/handling, submission of specimen other than nasopharyngeal swab, presence of viral mutation(s) within the areas targeted by this assay, and inadequate number of viral copies (<131 copies/mL). A negative result must be combined with clinical observations, patient history, and epidemiological information. The expected result is Negative. Fact Sheet for Patients:  PinkCheek.be Fact Sheet for Healthcare Providers:  GravelBags.it This test is not yet ap proved or cleared by the [place] FDA and  has been authorized for detection and/or diagnosis of SARS-CoV-2 by FDA under an Emergency Use Authorization (EUA). This EUA will remain  in effect (meaning this test can be used) for the duration of the COVID-19 declaration under Section 564(b)(1) of the Act, 21 U.S.C. section 360bbb-3(b)(1), unless the authorization is terminated or revoked sooner.    Influenza A by PCR NEGATIVE NEGATIVE Final   Influenza B by PCR NEGATIVE NEGATIVE Final    Comment: (NOTE) The Xpert Xpress SARS-CoV-2/FLU/RSV assay is intended as an aid in  the diagnosis of influenza from Nasopharyngeal swab specimens and  should not be used as a sole basis for treatment. Nasal washings and  aspirates are unacceptable for Xpert Xpress SARS-CoV-2/FLU/RSV  testing. Fact Sheet for Patients: PinkCheek.be Fact Sheet for Healthcare Providers: GravelBags.it This test is not yet approved or cleared by the [place] FDA and  has been  authorized for detection and/or diagnosis of SARS-CoV-2 by  FDA under an Emergency Use Authorization (EUA). This EUA will remain  in effect (meaning this test can be used) for the duration of the  Covid-19 declaration under Section 564(b)(1) of the Act, 21  U.S.C. section 360bbb-3(b)(1), unless the authorization is  terminated or revoked. Performed at [facility], [address] [place], [place] [address]     Coagulation Studies: No results for input(s): LABPROT, INR in the last 72 hours.  Urinalysis: No results for input(s): COLORURINE, LABSPEC, PHURINE, GLUCOSEU, HGBUR, BILIRUBINUR, KETONESUR, PROTEINUR, UROBILINOGEN, NITRITE, LEUKOCYTESUR in the last 72 hours.  Invalid input(s): APPERANCEUR    Imaging: [place] ASCITES (ABDOMEN  LIMITED)  Result Date: 07/21/2019 CLINICAL DATA:  Abdominal distension, possible ascites EXAM: LIMITED ABDOMEN ULTRASOUND FOR ASCITES TECHNIQUE: Limited ultrasound survey for ascites was performed in all four abdominal quadrants. COMPARISON:  None. FINDINGS: Minimal ascites is noted. No sizable pocket to allow for safe paracentesis is noted. Small left pleural effusion is noted. IMPRESSION: Minimal ascites. Small left pleural effusion. Electronically Signed   By: Inez Catalina M.D.   On: 07/21/2019 15:07     Medications:   . sodium chloride    . furosemide (LASIX) infusion 10 mg/hr (07/22/19 1711)   . apixaban  2.5 mg Oral BID  . vitamin C  1,000 mg Oral Daily  . cholecalciferol  2,000 Units Oral Daily  . hydrALAZINE  50 mg Oral TID  . insulin aspart  0-20 Units Subcutaneous TID WC  . insulin aspart  6 Units Subcutaneous TID WC  . isosorbide mononitrate  30 mg Oral Daily  . multivitamin with minerals  1 tablet Oral Daily  . neomycin-polymyxin-dexameth  1 application Left Eye BID  . omega-3 acid ethyl esters  1 g Oral Daily  . polyethylene glycol  17 g Oral Daily  . rosuvastatin  5 mg Oral Once per day on Mon Wed Fri  . silver sulfADIAZINE  1  application Topical BID  . sodium chloride flush  3 mL Intravenous Q12H  . spironolactone  25 mg Oral Daily   sodium chloride, acetaminophen, levalbuterol, nitroGLYCERIN, ondansetron (ZOFRAN) IV, sodium chloride flush  Assessment/ Plan:  [name] is a 79 y.o. white  male with atrial fibrillation, congestive heart failure, hypertension, coronary artery disease, diabetes mellitus type II who is admitted to [facility] on 07/19/2019 for Acute on chronic diastolic CHF (congestive heart failure) ([place]) [I50.33]  1. Acute Renal Failure: on chronic kidney disease stage IV. Baseline creatinine of 2.29, GFR of 26 on 06/15/19.  Chronic kidney disease with proteinuria and positive ANA. 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  Acute kidney injury secondary to acute cardio-renal syndrome.  Lab Results  Component Value Date   CREATININE 2.64 (H) 07/23/2019   CREATININE 2.48 (H) 07/22/2019   CREATININE 2.54 (H) 07/21/2019   05/01 0701 - 05/02 0700 In: 359 [P.O.:240; I.V.:119] Out: 2825 [Urine:2825]   2. Acute exacerbation of chronic diastolic congestive heart failure:  Pulmonary HTN Severe lower extremity edema and lower abdominal edema echocardiogram on 05/08/19 with preserved systolic function.  2D echo from February 15 shows LVEF 55 to 60%, normal LV function, mild LVH, indeterminate left ventricular diastolic parameters Moderately elevated pulmonary artery systolic pressure of 56 mm, moderately dilated pulmonary artery   3. Hypertension:  BP Readings from Last 3 Encounters:  07/23/19 139/72  07/19/19 126/60  07/19/19 134/70  Avoid hypotension   4. Diabetes mellitus type II with chronic kidney disease: insulin dependent.  Lab Results  Component Value Date   HGBA1C 7.2 (H) 07/19/2019    Plan - Continue furosemide gtt  - Discussed with patient that if medical treatments fail, dialysis is a possibility - Continue spironolactone - started this admission -  Fluid restriction, salt restriction and daily standing weights.  - May need evaluation for sleep apnea as outpatient    LOS: 4 Mahmood Boehringer 5/2/202110:02 AM

## 2019-07-23 NOTE — Plan of Care (Signed)
  Problem: Elimination: Goal: Will not experience complications related to bowel motility Outcome: Progressing   Problem: Education: Goal: Ability to demonstrate management of disease process will improve Outcome: Progressing Goal: Ability to verbalize understanding of medication therapies will improve Outcome: Progressing

## 2019-07-23 NOTE — Progress Notes (Signed)
Progress Note  Patient Name: Kyle Roberson Date of Encounter: 07/23/2019  Primary Cardiologist: No primary care provider on file. Dr. Saunders Roberson  Subjective   He had good urine output overnight.  Breathing is stable.  No chest pain.  Frustrated that his weight is not coming down.  Inpatient Medications    Scheduled Meds: . apixaban  2.5 mg Oral BID  . vitamin C  1,000 mg Oral Daily  . cholecalciferol  2,000 Units Oral Daily  . hydrALAZINE  50 mg Oral TID  . insulin aspart  0-20 Units Subcutaneous TID WC  . insulin aspart  6 Units Subcutaneous TID WC  . isosorbide mononitrate  30 mg Oral Daily  . multivitamin with minerals  1 tablet Oral Daily  . neomycin-polymyxin-dexameth  1 application Left Eye BID  . omega-3 acid ethyl esters  1 g Oral Daily  . polyethylene glycol  17 g Oral Daily  . rosuvastatin  5 mg Oral Once per day on Mon Wed Fri  . silver sulfADIAZINE  1 application Topical BID  . sodium chloride flush  3 mL Intravenous Q12H  . spironolactone  25 mg Oral Daily   Continuous Infusions: . sodium chloride    . furosemide (LASIX) infusion 10 mg/hr (07/22/19 1711)   PRN Meds: sodium chloride, acetaminophen, levalbuterol, nitroGLYCERIN, ondansetron (ZOFRAN) IV, sodium chloride flush   Vital Signs    Vitals:   07/22/19 1611 07/22/19 2029 07/23/19 0440 07/23/19 0721  BP: (!) 134/45 (!) 150/69 (!) 153/67 139/72  Pulse: (!) 52 (!) 53 (!) 59 65  Resp: 18 20 20 17   Temp:  98.2 F (36.8 C) 98.5 F (36.9 C) 97.9 F (36.6 C)  TempSrc:  Oral Oral   SpO2: 93% 95% 94% 93%  Weight:   123.2 kg     Intake/Output Summary (Last 24 hours) at 07/23/2019 1123 Last data filed at 07/23/2019 1014 Gross per 24 hour  Intake 721.98 ml  Output 3625 ml  Net -2903.02 ml   Last 3 Weights 07/23/2019 07/22/2019 07/21/2019  Weight (lbs) 271 lb 9.6 oz 271 lb 271 lb 3.2 oz  Weight (kg) 123.197 kg 122.925 kg 123.016 kg      Telemetry    Atrial fibrillation.  PVCs.  Rate less than 100 bpm. -  Personally Reviewed  ECG    n/a - Personally Reviewed  Physical Exam   VS:  BP 139/72 (BP Location: Left Arm)   Pulse 65   Temp 97.9 F (36.6 C)   Resp 17   Wt 123.2 kg   SpO2 93%   BMI 41.30 kg/m  , BMI Body mass index is 41.3 kg/m. GENERAL:  Chronically ill-appearing HEENT: Pupils equal round and reactive, fundi not visualized, oral mucosa unremarkable NECK:  + jugular venous distention to upper neck sitting upright. Waveform within normal limits, carotid upstroke brisk and symmetric, no bruit LUNGS:  Mild crackles at R base HEART: Irregularly irregular.  PMI not displaced or sustained,S1 and S2 within normal limits, no S3, no S4, no clicks, no rubs, no murmurs ABD:  positive bowel sounds normal in frequency in pitch, no bruits, no rebound, no guarding, no midline pulsatile mass, no hepatomegaly, no splenomegaly EXT:  2 plus pulses throughout, anasarca.  Pitting edema to the thighs, no cyanosis no clubbing SKIN:  No rashes no nodules NEURO:  Cranial nerves II through XII grossly intact, motor grossly intact throughout PSYCH:  Cognitively intact, oriented to person place and time   Labs    High  Sensitivity Troponin:   Recent Labs  Lab 07/19/19 1325 07/19/19 1510  TROPONINIHS 17 17      Chemistry Recent Labs  Lab 07/21/19 0517 07/22/19 0514 07/23/19 0553  NA 144 140 140  K 3.8 3.7 3.6  CL 100 98 96*  CO2 32 28 31  GLUCOSE 105* 115* 136*  BUN 57* 55* 61*  CREATININE 2.54* 2.48* 2.64*  CALCIUM 9.0 8.6* 8.8*  GFRNONAA 23* 24* 22*  GFRAA 27* 27* 25*  ANIONGAP 12 14 13      Hematology Recent Labs  Lab 07/22/19 0514  WBC 7.2  RBC 3.94*  HGB 10.4*  HCT 34.8*  MCV 88.3  MCH 26.4  MCHC 29.9*  RDW 15.6*  PLT 191    BNPNo results for input(s): BNP, PROBNP in the last 168 hours.   DDimer No results for input(s): DDIMER in the last 168 hours.   Radiology    [place] ASCITES (ABDOMEN LIMITED)  Result Date: 07/21/2019 CLINICAL DATA:  Abdominal distension,  possible ascites EXAM: LIMITED ABDOMEN ULTRASOUND FOR ASCITES TECHNIQUE: Limited ultrasound survey for ascites was performed in all four abdominal quadrants. COMPARISON:  None. FINDINGS: Minimal ascites is noted. No sizable pocket to allow for safe paracentesis is noted. Small left pleural effusion is noted. IMPRESSION: Minimal ascites. Small left pleural effusion. Electronically Signed   By: Kyle Roberson M.D.   On: 07/21/2019 15:07    Cardiac Studies   Echo 05/08/19: IMPRESSIONS    1. Left ventricular ejection fraction, by estimation, is 55 to 60%. The  left ventricle has normal function. The left ventricle has no regional  wall motion abnormalities. There is mild left ventricular hypertrophy.  Left ventricular diastolic parameters  are indeterminate.  2. Right ventricular systolic function is normal. The right ventricular  size is normal. There is moderately elevated pulmonary artery systolic  pressure. The estimated right ventricular systolic pressure is 09.7 mmHg.  3. Left atrial size was moderately dilated.  4. The mitral valve is normal in structure and function. Mild to moderate  mitral valve regurgitation. No evidence of mitral stenosis.  5. The aortic valve is normal in structure and function. Aortic valve  regurgitation is mild. Mild aortic valve sclerosis is present, with no  evidence of aortic valve stenosis.  6. Moderately dilated pulmonary artery.  7. The inferior vena cava is dilated in size with <50% respiratory  variability, suggesting right atrial pressure of 15 mmHg.   Patient Profile     79 y.o. male with persistent atrial fibrillation, CAD, pulmonary hypertension, hypertension, hyperlipidemia, CKD 4, and diabetes admitted with acute on chronic diastolic heart failure.   Assessment & Plan    #Acute on chronic diastolic heart failure: #Pulmonary hypertension: [name] continues to have anasarca.  He was net negative about 2.5 L yesterday.  Unfortunately  his weight is not showing improvement despite having a negative fluid output.  It is unclear how accurate these weights are.  He does report that he had increased urine output and is not drinking anything other than what has been documented.  He has declined right heart catheterization.  Lasix drip was increased to 10 mg/h on 5/1.  He also received a dose of metolazone.  We will not give metolazone today, but will likely give some tomorrow especially if urine output starts to slow.  Amlodipine was discontinued due to concern it was contributing to his edema.  Continue spironolactone.  If renal function worsens this can be discontinued.  # Essential hypertension:  BP  poorly controlled.  Diuresis as above.  Increase hydralazine to 100 mg 3 times daily.  Increase Imdur to 60 mg.  # Persistent atrial fibrillation:  -Controlled.  He is bradycardic especially overnight.  Continue Eliquis.  # CKD IV:   Renal function relatively stable.  Continue with diuresis as above.  Nephrology is following.  # CAD:  # Hyperlipidemia: He is not on aspirin given that he is on Eliquis.  He is not on a beta-blocker due to bradycardia.  Continue rosuvastatin.      For questions or updates, please contact [place] Please consult [email or web address] for contact info under        Signed, Skeet Latch, MD  07/23/2019, 11:23 AM

## 2019-07-24 DIAGNOSIS — I5033 Acute on chronic diastolic (congestive) heart failure: Secondary | ICD-10-CM | POA: Diagnosis not present

## 2019-07-24 LAB — GLUCOSE, CAPILLARY
Glucose-Capillary: 133 mg/dL — ABNORMAL HIGH (ref 70–99)
Glucose-Capillary: 124 mg/dL — ABNORMAL HIGH (ref 70–99)
Glucose-Capillary: 114 mg/dL — ABNORMAL HIGH (ref 70–99)
Glucose-Capillary: 181 mg/dL — ABNORMAL HIGH (ref 70–99)
Glucose-Capillary: 151 mg/dL — ABNORMAL HIGH (ref 70–99)
Glucose-Capillary: 130 mg/dL — ABNORMAL HIGH (ref 70–99)
Glucose-Capillary: 136 mg/dL — ABNORMAL HIGH (ref 70–99)
Glucose-Capillary: 126 mg/dL — ABNORMAL HIGH (ref 70–99)
Glucose-Capillary: 93 mg/dL (ref 70–99)
Glucose-Capillary: 189 mg/dL — ABNORMAL HIGH (ref 70–99)
Glucose-Capillary: 141 mg/dL — ABNORMAL HIGH (ref 70–99)
Glucose-Capillary: 263 mg/dL — ABNORMAL HIGH (ref 70–99)
Glucose-Capillary: 236 mg/dL — ABNORMAL HIGH (ref 70–99)

## 2019-07-24 LAB — BASIC METABOLIC PANEL
Anion gap: 12 (ref 5–15)
BUN: 69 mg/dL — ABNORMAL HIGH (ref 8–23)
CO2: 33 mmol/L — ABNORMAL HIGH (ref 22–32)
Calcium: 8.9 mg/dL (ref 8.9–10.3)
Chloride: 95 mmol/L — ABNORMAL LOW (ref 98–111)
Creatinine, Ser: 2.7 mg/dL — ABNORMAL HIGH (ref 0.61–1.24)
GFR calc Af Amer: 25 mL/min — ABNORMAL LOW (ref >60)
GFR calc non Af Amer: 21 mL/min — ABNORMAL LOW (ref >60)
Glucose, Bld: 141 mg/dL — ABNORMAL HIGH (ref 70–99)
Potassium: 3.5 mmol/L (ref 3.5–5.1)
Sodium: 140 mmol/L (ref 135–145)

## 2019-07-24 NOTE — Progress Notes (Signed)
Progress Note  Patient Name: Kyle Roberson Date of Encounter: 07/24/2019  Primary Cardiologist: End  Subjective   Dyspnea is improving, still symptomatic when getting into and out of bed. No chest pain. Documented UOP of 2.8 L for the past 24 hours with a net - 8.3 L for the admission. Weight 123-->119.6 kg. Remains on Lasix gtt at 10 mg/hr. Renal function slightly worse this morning.  Inpatient Medications    Scheduled Meds: . apixaban  2.5 mg Oral BID  . vitamin C  1,000 mg Oral Daily  . cholecalciferol  2,000 Units Oral Daily  . hydrALAZINE  100 mg Oral TID  . insulin aspart  0-20 Units Subcutaneous TID WC  . insulin aspart  6 Units Subcutaneous TID WC  . isosorbide mononitrate  60 mg Oral Daily  . multivitamin with minerals  1 tablet Oral Daily  . neomycin-polymyxin-dexameth  1 application Left Eye BID  . omega-3 acid ethyl esters  1 g Oral Daily  . polyethylene glycol  17 g Oral Daily  . rosuvastatin  5 mg Oral Once per day on Mon Wed Fri  . silver sulfADIAZINE  1 application Topical BID  . sodium chloride flush  3 mL Intravenous Q12H  . spironolactone  25 mg Oral Daily   Continuous Infusions: . sodium chloride    . furosemide (LASIX) infusion 10 mg/hr (07/23/19 1846)   PRN Meds: sodium chloride, acetaminophen, levalbuterol, nitroGLYCERIN, ondansetron (ZOFRAN) IV, sodium chloride flush   Vital Signs    Vitals:   07/23/19 0721 07/23/19 1134 07/23/19 1638 07/23/19 1922  BP: 139/72 137/61 135/64 (!) 143/60  Pulse: 65 (!) 55 (!) 54 (!) 58  Resp: 17 17 17  (!) 21  Temp: 97.9 F (36.6 C) 97.8 F (36.6 C) 97.9 F (36.6 C) (!) 97.5 F (36.4 C)  TempSrc:    Oral  SpO2: 93% 94% 94% 92%  Weight:        Intake/Output Summary (Last 24 hours) at 07/24/2019 0733 Last data filed at 07/24/2019 0962 Gross per 24 hour  Intake 1083 ml  Output 3940 ml  Net -2857 ml   Filed Weights   07/21/19 0428 07/22/19 0416 07/23/19 0440  Weight: 123 kg 122.9 kg 123.2 kg     Telemetry    Afib with bradycardic ventricular response - Personally Reviewed  ECG    No new tracings - Personally Reviewed  Physical Exam   GEN: No acute distress, chronically ill appearing.    Neck: JVD elevated to the angle of the mandible. Cardiac: Irregularly irregular and bradycardic, no murmurs, rubs, or gallops.  Respiratory: Diminished breath sounds bilaterally with faint crackles along the bases.  GI: Soft, nontender, anasaraca.   MS: Improved 1+ bilateral pitting edema to the thighs/flank; No deformity. Neuro:  Alert and oriented x 3; Nonfocal.  Psych: Normal affect.  Labs    Chemistry Recent Labs  Lab 07/21/19 0517 07/22/19 0514 07/23/19 0553  NA 144 140 140  K 3.8 3.7 3.6  CL 100 98 96*  CO2 32 28 31  GLUCOSE 105* 115* 136*  BUN 57* 55* 61*  CREATININE 2.54* 2.48* 2.64*  CALCIUM 9.0 8.6* 8.8*  GFRNONAA 23* 24* 22*  GFRAA 27* 27* 25*  ANIONGAP 12 14 13      Hematology Recent Labs  Lab 07/22/19 0514  WBC 7.2  RBC 3.94*  HGB 10.4*  HCT 34.8*  MCV 88.3  MCH 26.4  MCHC 29.9*  RDW 15.6*  PLT 191    Cardiac  EnzymesNo results for input(s): TROPONINI in the last 168 hours. No results for input(s): TROPIPOC in the last 168 hours.   BNPNo results for input(s): BNP, PROBNP in the last 168 hours.   DDimer No results for input(s): DDIMER in the last 168 hours.   Radiology    No results found.  Cardiac Studies   2D echo 04/2019: 1. Left ventricular ejection fraction, by estimation, is 55 to 60%. The  left ventricle has normal function. The left ventricle has no regional  wall motion abnormalities. There is mild left ventricular hypertrophy.  Left ventricular diastolic parameters  are indeterminate.  2. Right ventricular systolic function is normal. The right ventricular  size is normal. There is moderately elevated pulmonary artery systolic  pressure. The estimated right ventricular systolic pressure is 93.5 mmHg.  3. Left atrial size  was moderately dilated.  4. The mitral valve is normal in structure and function. Mild to moderate  mitral valve regurgitation. No evidence of mitral stenosis.  5. The aortic valve is normal in structure and function. Aortic valve  regurgitation is mild. Mild aortic valve sclerosis is present, with no  evidence of aortic valve stenosis.  6. Moderately dilated pulmonary artery.  7. The inferior vena cava is dilated in size with <50% respiratory  variability, suggesting right atrial pressure of 15 mmHg.   Patient Profile     79 y.o. male with history of CAD, persistent Afib, HFpEF, pulmonary hypertension, DM2, HTN, HLD, CKD stage IV, hyperparathyroidism, and GERD who we are seeing for acute on chronic HFpEF.  Assessment & Plan    1. Acute on chronic HFpEF/pulmonary hypertension: -Volume status remains significantly elevated -He has been transitioned from IV push Lasix to Lasix gtt in an effort to augment diuresis with preservation of renal function -Documented UOP is still 2.8 L over the past 24 hours with a net - 8.3 L for the admission -Weight 701-->779.3 kg, uncertain of weight accuracy -Received metolazone 5/1, with further recommendations on repeat dosing pending BMP this morning  -Decrease Lasix gtt back to 8 mg/hr given worsening renal function following escalation to 10 mg/hr and one time dose of metolazone on 5/1 -He has previously declined RHC this admission, though is more open to this at this time -This could certainly help gauge his diuresis, will discuss with MD as we would need to wash out his Eliquis and place him on a heparin gtt -He remains on spironolactone, monitor with underlying CKD, may need to hold -Continue Imdur secondary to mildly elevated BP -Hydralazine  -He has an appointment with the advanced heart failure clinic 5/6, which will need to be moved -Amlodipine has been held secondary to lower extremity swelling  2. 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 Persistent Afib: -Ventricular rates are bradycardic, not on rate controlling medications -Given his refusal for RHC we will continue him on Eliquis at this time -CHADS2VASc at least 6 (CHF, HTN, age x 2, DM, vascular disease) -Cannot exclude brief episode of junctional rhythm on telemetry  4. 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 HTN: -BP in the 903E systolic this morning -  Medications as above  6. HLD: -Tolerating Crestor -LDL 73 from 07/2018  For questions or updates, please contact [place] Please consult [email or web address] for contact info under Cardiology/STEMI.    Signed, Christell Faith, PA-C [place] Pager: [phone] 07/24/2019, 7:33 AM

## 2019-07-24 NOTE — Progress Notes (Signed)
Patient ID: Kyle Roberson, male   DOB: [birth year], 79 y.o.   MRN: [id number] [facility] PROGRESS NOTE  Kyle Roberson NFA:[id number] DOB: 1941-01-06 DOA: 07/19/2019 PCP: Glean Hess, MD  HPI/Subjective: Patient happier that he is finally dropped some weight.  Weight down to 263.12 pounds today.  States he still urinating very well.  Some shortness of breath.  Objective: Vitals:   07/24/19 0740 07/24/19 1152  BP: (!) 146/57 (!) 126/52  Pulse: (!) 51 65  Resp: 19 19  Temp: 97.6 F (36.4 C) 97.9 F (36.6 C)  SpO2: 94% 94%    Intake/Output Summary (Last 24 hours) at 07/24/2019 1401 Last data filed at 07/24/2019 1031 Gross per 24 hour  Intake 960 ml  Output 3340 ml  Net -2380 ml   Filed Weights   07/22/19 0416 07/23/19 0440 07/24/19 0700  Weight: 122.9 kg 123.2 kg 119.6 kg    ROS: Review of Systems  Constitutional: Negative for fever.  Eyes: Negative for blurred vision.  Respiratory: Positive for shortness of breath. Negative for cough.   Cardiovascular: Negative for chest pain.  Gastrointestinal: Negative for abdominal pain, nausea and vomiting.  Genitourinary: Negative for dysuria.  Musculoskeletal: Negative for joint pain.  Neurological: Negative for headaches.   Exam: Physical Exam  Constitutional: He is oriented to person, place, and time.  HENT:  Nose: No mucosal edema.  Mouth/Throat: No oropharyngeal exudate or posterior oropharyngeal edema.  Eyes: Conjunctivae and lids are normal.  Neck: Carotid bruit is not present.  Cardiovascular: S1 normal and S2 normal. Exam reveals no gallop.  No murmur heard. Respiratory: No respiratory distress. He has decreased breath sounds in the right lower field and the left lower field. He has no wheezes. He has no rhonchi. He has no rales.  GI: Soft. Bowel sounds are normal. He exhibits distension. There is no abdominal tenderness.  Musculoskeletal:     Right knee: Swelling present.     Left knee: Swelling  present.     Right ankle: Swelling present.     Left ankle: Swelling present.  Lymphadenopathy:    He has no cervical adenopathy.  Neurological: He is alert and oriented to person, place, and time. No cranial nerve deficit.  Skin: Skin is warm. Nails show no clubbing.  Chronic lower extremity discoloration  Psychiatric: He has a normal mood and affect.      Data Reviewed: Basic Metabolic Panel: Recent Labs  Lab 07/20/19 0518 07/21/19 0517 07/22/19 0514 07/23/19 0553 07/24/19 0634  NA 142 144 140 140 140  K 3.5 3.8 3.7 3.6 3.5  CL 100 100 98 96* 95*  CO2 31 32 28 31 33*  GLUCOSE 173* 105* 115* 136* 141*  BUN 57* 57* 55* 61* 69*  CREATININE 2.50* 2.54* 2.48* 2.64* 2.70*  CALCIUM 8.9 9.0 8.6* 8.8* 8.9  MG  --   --  2.3  --   --    BNP (last 3 results) Recent Labs    04/26/19 1629 05/07/19 1731 07/14/19 1134  BNP 169.0* 165.0* 224.0*    CBG: Recent Labs  Lab 07/23/19 1134 07/23/19 1635 07/23/19 2110 07/24/19 0740 07/24/19 1153  GLUCAP 126* 93 189* 141* 263*    Scheduled Meds: . apixaban  2.5 mg Oral BID  . vitamin C  1,000 mg Oral Daily  . cholecalciferol  2,000 Units Oral Daily  . hydrALAZINE  100 mg Oral TID  . insulin aspart  0-20 Units Subcutaneous TID WC  . insulin aspart  6 Units Subcutaneous  TID WC  . isosorbide mononitrate  60 mg Oral Daily  . multivitamin with minerals  1 tablet Oral Daily  . neomycin-polymyxin-dexameth  1 application Left Eye BID  . omega-3 acid ethyl esters  1 g Oral Daily  . polyethylene glycol  17 g Oral Daily  . rosuvastatin  5 mg Oral Once per day on Mon Wed Fri  . silver sulfADIAZINE  1 application Topical BID  . sodium chloride flush  3 mL Intravenous Q12H  . spironolactone  25 mg Oral Daily   Continuous Infusions: . sodium chloride    . furosemide (LASIX) infusion 8 mg/hr (07/24/19 1001)    Assessment/Plan:  1. Acute on chronic diastolic congestive heart failure with anasarca.  The patient had a tremendous  amount of weight gain and his baseline weight is between 245 and 250.  Patient is down to 263.12 pounds today.  Continue Lasix drip at 8 mg an hour.  Wondering if this could be right heart failure with increased pulmonary pressures seen on echocardiogram.  With bradycardia unable to do beta-blocker.  Patient still requires diuresis at this point. 2. 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  Hemoglobin A1c 7.2.  Need to watch creatinine closely with diuresis.  Creatinine started to creep up a little bit to 2.70.  Patient on sliding scale insulin.  Glipizide is not a great medication with the patient's kidney function.  May be able to do Tradjenta as outpatient. 3. Chronic atrial fibrillation on Eliquis 4. Hyperlipidemia unspecified on Crestor 5. Essential hypertension.  Currently on hydralazine and spironolactone and Lasix drip.  Norvasc on hold secondary to anasarca 6. Abdominal swelling.  Not enough fluid in the abdomen to do a paracentesis.  Code Status:     Code Status Orders  (From admission, onward)         Start     Ordered   07/19/19 1309  Full code  Continuous     07/19/19 1314        Code Status History    Date Active Date Inactive Code Status Order ID Comments User Context   05/07/2019 2201 05/10/2019 0208 Full Code [id number]  Athena Masse, MD ED   07/06/2018 2253 07/12/2018 1932 Full Code [id number]  Lance Coon, MD Inpatient   Advance Care Planning Activity    Advance Directive Documentation     Most Recent Value  Type of Advance Directive  Living will, Healthcare Power of Attorney  Pre-existing out of facility DNR order (yellow form or pink MOST form)  --  "MOST" Form in Place?  --     Family Communication: Spoke with [name] on the phone Disposition Plan: Lasix drip at 8 mg/h.  Patient is finally starting to lose weight but creatinine starting to creep up a little bit.  Patient is still fluid overloaded and likely will need a few more days of  diuresis here in the hospital.  Consultants:  Cardiology  Nephrology  Time spent: 27 minutes.  Case discussed with nephrology and cardiology.  [place]  [facility] [employer]

## 2019-07-24 NOTE — Care Management Important Message (Signed)
Important Message  Patient Details  Name: Kyle Roberson MRN: [id number] Date of Birth: 1940-06-24   Medicare Important Message Given:  Yes     Dannette Barbara 07/24/2019, 12:33 PM

## 2019-07-24 NOTE — Progress Notes (Signed)
[facility]  ROUNDING NOTE   Subjective:   Sitting up on the side of the bed Reports that urine output is good since yesterday Appetite is fair Continues to have large amount of edema over lower abdomen and both legs  Objective:  Vital signs in last 24 hours:  Temp:  [97.5 F (36.4 C)-97.9 F (36.6 C)] 97.9 F (36.6 C) (05/03 1152) Pulse Rate:  [51-65] 65 (05/03 1152) Resp:  [17-21] 19 (05/03 1152) BP: (126-146)/(52-64) 126/52 (05/03 1152) SpO2:  [92 %-94 %] 94 % (05/03 1152) Weight:  [119.6 kg] 119.6 kg (05/03 0700)  Weight change: -3.629 kg Filed Weights   07/22/19 0416 07/23/19 0440 07/24/19 0700  Weight: 122.9 kg 123.2 kg 119.6 kg    Intake/Output: I/O last 3 completed shifts: In: 1083 [P.O.:1080; I.V.:3] Out: 5365 [Urine:5365]   Intake/Output this shift:  Total I/O In: 240 [P.O.:240] Out: 400 [Urine:400]  Physical Exam: General:  Sitting up on the side of the bed, no acute distress  Head:  Normocephalic  Eyes:  Anicteric  Neck:  Supple, no JVD  Lungs:   Mild basilar crackles bilaterally, room air  Heart:  No rub  Abdomen:   Soft, distended  Extremities:  3+ pitting edema over legs and lower abdomen  Neurologic:  Alert, oriented  Skin:  Congestive hyperemia over the legs       Basic Metabolic Panel: Recent Labs  Lab 07/20/19 0518 07/20/19 0518 07/21/19 0517 07/21/19 0517 07/22/19 0514 07/23/19 0553 07/24/19 0634  NA 142  --  144  --  140 140 140  K 3.5  --  3.8  --  3.7 3.6 3.5  CL 100  --  100  --  98 96* 95*  CO2 31  --  32  --  28 31 33*  GLUCOSE 173*  --  105*  --  115* 136* 141*  BUN 57*  --  57*  --  55* 61* 69*  CREATININE 2.50*  --  2.54*  --  2.48* 2.64* 2.70*  CALCIUM 8.9   < > 9.0   < > 8.6* 8.8* 8.9  MG  --   --   --   --  2.3  --   --    < > = values in this interval not displayed.    Liver Function Tests: No results for input(s): AST, ALT, ALKPHOS, BILITOT, PROT, ALBUMIN in the last 168 hours. No results for  input(s): LIPASE, AMYLASE in the last 168 hours. No results for input(s): AMMONIA in the last 168 hours.  CBC: Recent Labs  Lab 07/22/19 0514  WBC 7.2  HGB 10.4*  HCT 34.8*  MCV 88.3  PLT 191    Cardiac Enzymes: No results for input(s): CKTOTAL, CKMB, CKMBINDEX, TROPONINI in the last 168 hours.  BNP: Invalid input(s): POCBNP  CBG: Recent Labs  Lab 07/23/19 1134 07/23/19 1635 07/23/19 2110 07/24/19 0740 07/24/19 1153  GLUCAP 126* 93 189* 141* 84*    Microbiology: Results for orders placed or performed during the hospital encounter of 05/07/19  Blood Culture (routine x 2)     Status: None   Collection Time: 05/07/19  6:45 PM   Specimen: BLOOD  Result Value Ref Range Status   Specimen Description BLOOD R AC  Final   Special Requests   Final    BOTTLES DRAWN AEROBIC AND ANAEROBIC Blood Culture results may not be optimal due to an excessive volume of blood received in culture bottles   Culture  Final    NO GROWTH 5 DAYS Performed at [facility], [address] [place], [place] [address]    Report Status 05/12/2019 FINAL  Final  Blood Culture (routine x 2)     Status: None   Collection Time: 05/07/19  6:45 PM   Specimen: BLOOD  Result Value Ref Range Status   Specimen Description BLOOD R HAND  Final   Special Requests   Final    BOTTLES DRAWN AEROBIC AND ANAEROBIC Blood Culture results may not be optimal due to an inadequate volume of blood received in culture bottles   Culture   Final    NO GROWTH 5 DAYS Performed at [facility], [address] [place], [place] [address]    Report Status 05/12/2019 FINAL  Final  Respiratory Panel by RT PCR (Flu A&B, Covid) - Nasopharyngeal Swab     Status: None   Collection Time: 05/07/19 11:58 PM   Specimen: Nasopharyngeal Swab  Result Value Ref Range Status   SARS Coronavirus 2 by RT PCR NEGATIVE NEGATIVE Final    Comment: (NOTE) SARS-CoV-2 target nucleic acids are NOT DETECTED. The SARS-CoV-2 RNA  is generally detectable in upper respiratoy specimens during the acute phase of infection. The lowest concentration of SARS-CoV-2 viral copies this assay can detect is 131 copies/mL. A negative result does not preclude SARS-Cov-2 infection and should not be used as the sole basis for treatment or other patient management decisions. A negative result may occur with  improper specimen collection/handling, submission of specimen other than nasopharyngeal swab, presence of viral mutation(s) within the areas targeted by this assay, and inadequate number of viral copies (<131 copies/mL). A negative result must be combined with clinical observations, patient history, and epidemiological information. The expected result is Negative. Fact Sheet for Patients:  PinkCheek.be Fact Sheet for Healthcare Providers:  GravelBags.it This test is not yet ap proved or cleared by the [place] FDA and  has been authorized for detection and/or diagnosis of SARS-CoV-2 by FDA under an Emergency Use Authorization (EUA). This EUA will remain  in effect (meaning this test can be used) for the duration of the COVID-19 declaration under Section 564(b)(1) of the Act, 21 U.S.C. section 360bbb-3(b)(1), unless the authorization is terminated or revoked sooner.    Influenza A by PCR NEGATIVE NEGATIVE Final   Influenza B by PCR NEGATIVE NEGATIVE Final    Comment: (NOTE) The Xpert Xpress SARS-CoV-2/FLU/RSV assay is intended as an aid in  the diagnosis of influenza from Nasopharyngeal swab specimens and  should not be used as a sole basis for treatment. Nasal washings and  aspirates are unacceptable for Xpert Xpress SARS-CoV-2/FLU/RSV  testing. Fact Sheet for Patients: PinkCheek.be Fact Sheet for Healthcare Providers: GravelBags.it This test is not yet approved or cleared by the [place] FDA and   has been authorized for detection and/or diagnosis of SARS-CoV-2 by  FDA under an Emergency Use Authorization (EUA). This EUA will remain  in effect (meaning this test can be used) for the duration of the  Covid-19 declaration under Section 564(b)(1) of the Act, 21  U.S.C. section 360bbb-3(b)(1), unless the authorization is  terminated or revoked. Performed at [facility], [address] [place], [place] [address]     Coagulation Studies: No results for input(s): LABPROT, INR in the last 72 hours.  Urinalysis: No results for input(s): COLORURINE, LABSPEC, PHURINE, GLUCOSEU, HGBUR, BILIRUBINUR, KETONESUR, PROTEINUR, UROBILINOGEN, NITRITE, LEUKOCYTESUR in the last 72 hours.  Invalid input(s): APPERANCEUR  Imaging: No results found.   Medications:   . sodium chloride    . furosemide (LASIX) infusion 8 mg/hr (07/24/19 1001)   . apixaban  2.5 mg Oral BID  . vitamin C  1,000 mg Oral Daily  . cholecalciferol  2,000 Units Oral Daily  . hydrALAZINE  100 mg Oral TID  . insulin aspart  0-20 Units Subcutaneous TID WC  . insulin aspart  6 Units Subcutaneous TID WC  . isosorbide mononitrate  60 mg Oral Daily  . multivitamin with minerals  1 tablet Oral Daily  . neomycin-polymyxin-dexameth  1 application Left Eye BID  . omega-3 acid ethyl esters  1 g Oral Daily  . polyethylene glycol  17 g Oral Daily  . rosuvastatin  5 mg Oral Once per day on Mon Wed Fri  . silver sulfADIAZINE  1 application Topical BID  . sodium chloride flush  3 mL Intravenous Q12H  . spironolactone  25 mg Oral Daily   sodium chloride, acetaminophen, levalbuterol, nitroGLYCERIN, ondansetron (ZOFRAN) IV, sodium chloride flush  Assessment/ Plan:  Kyle Roberson is a 79 y.o. white  male with atrial fibrillation, congestive heart failure, hypertension, coronary artery disease, diabetes mellitus type II who is admitted to [facility] on 07/19/2019 for Acute on chronic diastolic CHF (congestive heart  failure) ([place]) [I50.33]  1. Acute Renal Failure: on chronic kidney disease stage IV. Baseline creatinine of 2.29, GFR of 26 on 06/15/19.  Chronic kidney disease with proteinuria and positive ANA. 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  Acute kidney injury secondary to acute cardio-renal syndrome.  Lab Results  Component Value Date   CREATININE 2.70 (H) 07/24/2019   CREATININE 2.64 (H) 07/23/2019   CREATININE 2.48 (H) 07/22/2019   05/02 0701 - 05/03 0700 In: 1083 [P.O.:1080; I.V.:3] Out: 3940 [Urine:3940]   2. Acute exacerbation of chronic diastolic congestive heart failure Pulmonary HTN Severe lower extremity edema and lower abdominal edema echocardiogram on 05/08/19 with preserved systolic function.  2D echo from February 15 shows LVEF 55 to 60%, normal LV function, mild LVH, indeterminate left ventricular diastolic parameters Moderately elevated pulmonary artery systolic pressure of 56 mm, moderately dilated pulmonary artery   3. Hypertension:  BP Readings from Last 3 Encounters:  07/24/19 (!) 126/52  07/19/19 126/60  07/19/19 134/70  Avoid hypotension   4. Diabetes mellitus type II with chronic kidney disease: insulin dependent.  Lab Results  Component Value Date   HGBA1C 7.2 (H) 07/19/2019  Avoid ACE inhibitor or ARB while undergoing aggressive diuresis  Plan - Continue furosemide gtt  - Discussed with patient that if medical treatments fail, dialysis is a possibility - Continue spironolactone - started this admission - Fluid restriction, salt restriction and daily standing weights.  - May need evaluation for sleep apnea as outpatient    LOS: 5 [place] 5/3/20213:01 PM

## 2019-07-24 NOTE — Plan of Care (Signed)

## 2019-07-25 ENCOUNTER — Ambulatory Visit: Payer: Medicare HMO | Admitting: Occupational Therapy

## 2019-07-25 DIAGNOSIS — I5033 Acute on chronic diastolic (congestive) heart failure: Secondary | ICD-10-CM | POA: Diagnosis not present

## 2019-07-25 DIAGNOSIS — I4819 Other persistent atrial fibrillation: Secondary | ICD-10-CM | POA: Diagnosis not present

## 2019-07-25 LAB — BASIC METABOLIC PANEL
Anion gap: 12 (ref 5–15)
BUN: 68 mg/dL — ABNORMAL HIGH (ref 8–23)
CO2: 33 mmol/L — ABNORMAL HIGH (ref 22–32)
Calcium: 9 mg/dL (ref 8.9–10.3)
Chloride: 93 mmol/L — ABNORMAL LOW (ref 98–111)
Creatinine, Ser: 2.78 mg/dL — ABNORMAL HIGH (ref 0.61–1.24)
GFR calc Af Amer: 24 mL/min — ABNORMAL LOW (ref >60)
GFR calc non Af Amer: 21 mL/min — ABNORMAL LOW (ref >60)
Glucose, Bld: 136 mg/dL — ABNORMAL HIGH (ref 70–99)
Potassium: 3.3 mmol/L — ABNORMAL LOW (ref 3.5–5.1)
Sodium: 138 mmol/L (ref 135–145)

## 2019-07-25 LAB — CBC
HCT: 35.4 % — ABNORMAL LOW (ref 39.0–52.0)
Hemoglobin: 10.8 g/dL — ABNORMAL LOW (ref 13.0–17.0)
MCH: 26.5 pg (ref 26.0–34.0)
MCHC: 30.5 g/dL (ref 30.0–36.0)
MCV: 86.8 fL (ref 80.0–100.0)
Platelets: 186 10*3/uL (ref 150–400)
RBC: 4.08 MIL/uL — ABNORMAL LOW (ref 4.22–5.81)
RDW: 15.9 % — ABNORMAL HIGH (ref 11.5–15.5)
WBC: 7.6 10*3/uL (ref 4.0–10.5)
nRBC: 0 % (ref 0.0–0.2)

## 2019-07-25 LAB — GLUCOSE, CAPILLARY
Glucose-Capillary: 88 mg/dL (ref 70–99)
Glucose-Capillary: 130 mg/dL — ABNORMAL HIGH (ref 70–99)
Glucose-Capillary: 246 mg/dL — ABNORMAL HIGH (ref 70–99)

## 2019-07-25 MED ORDER — LINAGLIPTIN 5 MG PO TABS
5.0000 mg | ORAL_TABLET | Freq: Every day | ORAL | Status: DC
  Administered 2019-07-26 – 2019-07-28 (×3): 5 mg via ORAL
  Filled 2019-07-25 (×3): qty 1

## 2019-07-25 NOTE — Progress Notes (Signed)
Progress Note  Patient Name: Kyle Roberson Date of Encounter: 07/25/2019  Primary Cardiologist: End  Subjective   SOB slightly improved. No chest pain. Documented UOP of 2.2 L for the past 24 hours with a net - 10.5 L for the admission to date. Weight 119.6-->116.9 kg. BUN/SCr 69/2.70-->68/2.78. Potassium 3.3.   Inpatient Medications    Scheduled Meds: . apixaban  2.5 mg Oral BID  . vitamin C  1,000 mg Oral Daily  . cholecalciferol  2,000 Units Oral Daily  . hydrALAZINE  100 mg Oral TID  . insulin aspart  0-20 Units Subcutaneous TID WC  . insulin aspart  6 Units Subcutaneous TID WC  . isosorbide mononitrate  60 mg Oral Daily  . multivitamin with minerals  1 tablet Oral Daily  . neomycin-polymyxin-dexameth  1 application Left Eye BID  . omega-3 acid ethyl esters  1 g Oral Daily  . polyethylene glycol  17 g Oral Daily  . rosuvastatin  5 mg Oral Once per day on Mon Wed Fri  . silver sulfADIAZINE  1 application Topical BID  . sodium chloride flush  3 mL Intravenous Q12H  . spironolactone  25 mg Oral Daily   Continuous Infusions: . sodium chloride    . furosemide (LASIX) infusion 8 mg/hr (07/25/19 0335)   PRN Meds: sodium chloride, acetaminophen, levalbuterol, nitroGLYCERIN, ondansetron (ZOFRAN) IV, sodium chloride flush   Vital Signs    Vitals:   07/24/19 1939 07/25/19 0337 07/25/19 0500 07/25/19 0731  BP: (!) 155/55 (!) 134/58  (!) 152/69  Pulse: 63 (!) 58  67  Resp: (!) 21 18  18   Temp: (!) 97.5 F (36.4 C) 97.6 F (36.4 C)  97.8 F (36.6 C)  TempSrc: Oral Oral  Oral  SpO2: 91% 95%  95%  Weight:   116.9 kg     Intake/Output Summary (Last 24 hours) at 07/25/2019 0735 Last data filed at 07/25/2019 0500 Gross per 24 hour  Intake 576 ml  Output 2800 ml  Net -2224 ml   Filed Weights   07/23/19 0440 07/24/19 0700 07/25/19 0500  Weight: 123.2 kg 119.6 kg 116.9 kg    Telemetry    Afib, 40s to 60s bpm - Personally Reviewed  ECG    No new tracings -  Personally Reviewed  Physical Exam   GEN: No acute distress.   Neck: JVD ~ 8 cm. Cardiac: Irregularly irregular, II/VI systolic murmur at the base, no rubs, or gallops.  Respiratory: Improved breath sounds with faint crackles along the bilateral bases.  GI: Soft, nontender, non-distended.   MS: Improving lower extremity and flank edema; No deformity. Neuro:  Alert and oriented x 3; Nonfocal.  Psych: Normal affect.  Labs    Chemistry Recent Labs  Lab 07/23/19 0553 07/24/19 0634 07/25/19 0549  NA 140 140 138  K 3.6 3.5 3.3*  CL 96* 95* 93*  CO2 31 33* 33*  GLUCOSE 136* 141* 136*  BUN 61* 69* 68*  CREATININE 2.64* 2.70* 2.78*  CALCIUM 8.8* 8.9 9.0  GFRNONAA 22* 21* 21*  GFRAA 25* 25* 24*  ANIONGAP 13 12 12      Hematology Recent Labs  Lab 07/22/19 0514 07/25/19 0549  WBC 7.2 7.6  RBC 3.94* 4.08*  HGB 10.4* 10.8*  HCT 34.8* 35.4*  MCV 88.3 86.8  MCH 26.4 26.5  MCHC 29.9* 30.5  RDW 15.6* 15.9*  PLT 191 186    Cardiac EnzymesNo results for input(s): TROPONINI in the last 168 hours. No results for  input(s): TROPIPOC in the last 168 hours.   BNPNo results for input(s): BNP, PROBNP in the last 168 hours.   DDimer No results for input(s): DDIMER in the last 168 hours.   Radiology    No results found.  Cardiac Studies   2D echo 04/2019: 1. Left ventricular ejection fraction, by estimation, is 55 to 60%. The  left ventricle has normal function. The left ventricle has no regional  wall motion abnormalities. There is mild left ventricular hypertrophy.  Left ventricular diastolic parameters  are indeterminate.  2. Right ventricular systolic function is normal. The right ventricular  size is normal. There is moderately elevated pulmonary artery systolic  pressure. The estimated right ventricular systolic pressure is 16.0 mmHg.  3. Left atrial size was moderately dilated.  4. The mitral valve is normal in structure and function. Mild to moderate  mitral valve  regurgitation. No evidence of mitral stenosis.  5. The aortic valve is normal in structure and function. Aortic valve  regurgitation is mild. Mild aortic valve sclerosis is present, with no  evidence of aortic valve stenosis.  6. Moderately dilated pulmonary artery.  7. The inferior vena cava is dilated in size with <50% respiratory  variability, suggesting right atrial pressure of 15 mmHg.   Patient Profile     79 y.o. male with history of CAD, persistent Afib, HFpEF, pulmonary hypertension, DM2, HTN, HLD, CKD stage IV, hyperparathyroidism, and GERD who we are seeing for acute on chronic HFpEF.  Assessment & Plan    1. Acute on chronic HFpEF/pulmonary hypertension: -He remains volume up, though is improving -He has been transitioned from IV push Lasix to Lasix gtt in an effort to augment diuresis with preservation of renal function -Documented UOP is still 2.2 L over the past 24 hours with a net - 10.5L for the admission -Weight 737-->106.2--694.8 kg, uncertain of weight accuracy -Received metolazone 5/1 -Continue Lasix gtt 8 mg/hr with stable GFR -He has declined RHC this admission, though is more open to this at this time -This could certainly help gauge his diuresis in the future after he has been adequately diuresed or if he were to redevelop symptoms despite renal function indicating he is dry -He remains on spironolactone, monitor with underlying CKD, may need to hold -ContinueImdur secondary to mildly elevated BP -Hydralazine  -Amlodipine has been held secondary to lower extremity swelling  2. CAD involving the native coronary arteries with stable angina: -History of remote stenting to the LAD, angioplasty to the diagonal branch with moderate RCA disease -On Eliquis in place of ASA -No chest pain -With underlying CKD stageIV, unlikely to plan for LHC this admission  3. Persistent Afib: -Ventricular rates are well controlled, not on rate controlling  medications -Given his refusal for RHC we will continue him on Eliquis at this time -CHADS2VASc at least 6 (CHF, HTN, age x 2, DM, vascular disease) -Cannot exclude brief episode of junctional rhythm on telemetry  4. 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 HTN: -Medications as above  6. HLD: -Tolerating Crestor -LDL 73 from 07/2018  For questions or updates, please contact [place] Please consult [email or web address] for contact info under Cardiology/STEMI.    Signed, Christell Faith, PA-C [place] Pager: [phone] 07/25/2019, 7:35 AM

## 2019-07-25 NOTE — Progress Notes (Signed)
Patient ID: Kyle Roberson, male   DOB: [birth year], 79 y.o.   MRN: [id number] [facility] PROGRESS NOTE  Kyle Roberson VQM:[id number] DOB: 09-29-1940 DOA: 07/19/2019 PCP: Glean Hess, MD  HPI/Subjective: Patient feels about the same even though he is lighter.  Sitting up in the chair today.  Urinating well.  Objective: Vitals:   07/25/19 1121 07/25/19 1610  BP: 131/61 (!) 143/60  Pulse: 61 62  Resp: 17 19  Temp: 97.7 F (36.5 C) 97.9 F (36.6 C)  SpO2: 93% 94%    Intake/Output Summary (Last 24 hours) at 07/25/2019 1615 Last data filed at 07/25/2019 1500 Gross per 24 hour  Intake 775.97 ml  Output 2900 ml  Net -2124.03 ml   Filed Weights   07/23/19 0440 07/24/19 0700 07/25/19 0500  Weight: 123.2 kg 119.6 kg 116.9 kg    ROS: Review of Systems  Constitutional: Negative for fever.  Eyes: Negative for blurred vision.  Respiratory: Positive for shortness of breath. Negative for cough.   Cardiovascular: Negative for chest pain.  Gastrointestinal: Negative for abdominal pain, constipation, diarrhea, nausea and vomiting.  Genitourinary: Negative for dysuria.  Musculoskeletal: Negative for joint pain.  Neurological: Negative for dizziness.   Exam: Physical Exam  Constitutional: He is oriented to person, place, and time.  HENT:  Nose: No mucosal edema.  Mouth/Throat: No oropharyngeal exudate or posterior oropharyngeal edema.  Eyes: Conjunctivae are normal.  Left eyelid inferiorly drooping down  Neck: Carotid bruit is not present.  Cardiovascular: S1 normal and S2 normal. Exam reveals no gallop.  Murmur heard.  Systolic murmur is present with a grade of 2/6. Respiratory: No respiratory distress. He has decreased breath sounds in the right lower field and the left lower field. He has no wheezes. He has no rhonchi. He has no rales.  GI: Soft. Bowel sounds are normal. There is no abdominal tenderness.  Musculoskeletal:     Right knee: Swelling present.      Left knee: Swelling present.     Right ankle: Swelling present.     Left ankle: Swelling present.  Lymphadenopathy:    He has no cervical adenopathy.  Neurological: He is alert and oriented to person, place, and time. No cranial nerve deficit.  Skin: Skin is warm. Nails show no clubbing.  Chronic lower extremity discoloration with some fluid bubbles.  Psychiatric: He has a normal mood and affect.      Data Reviewed: Basic Metabolic Panel: Recent Labs  Lab 07/21/19 0517 07/22/19 0514 07/23/19 0553 07/24/19 0634 07/25/19 0549  NA 144 140 140 140 138  K 3.8 3.7 3.6 3.5 3.3*  CL 100 98 96* 95* 93*  CO2 32 28 31 33* 33*  GLUCOSE 105* 115* 136* 141* 136*  BUN 57* 55* 61* 69* 68*  CREATININE 2.54* 2.48* 2.64* 2.70* 2.78*  CALCIUM 9.0 8.6* 8.8* 8.9 9.0  MG  --  2.3  --   --   --    CBC: Recent Labs  Lab 07/22/19 0514 07/25/19 0549  WBC 7.2 7.6  HGB 10.4* 10.8*  HCT 34.8* 35.4*  MCV 88.3 86.8  PLT 191 186   BNP (last 3 results) Recent Labs    04/26/19 1629 05/07/19 1731 07/14/19 1134  BNP 169.0* 165.0* 224.0*    CBG: Recent Labs  Lab 07/24/19 1153 07/24/19 1643 07/24/19 2201 07/25/19 0731 07/25/19 1122  GLUCAP 263* 236* 88 130* 246*     Scheduled Meds: . apixaban  2.5 mg Oral BID  . vitamin C  1,000 mg Oral Daily  . cholecalciferol  2,000 Units Oral Daily  . hydrALAZINE  100 mg Oral TID  . insulin aspart  0-20 Units Subcutaneous TID WC  . insulin aspart  6 Units Subcutaneous TID WC  . isosorbide mononitrate  60 mg Oral Daily  . [START ON 07/26/2019] linagliptin  5 mg Oral Daily  . multivitamin with minerals  1 tablet Oral Daily  . neomycin-polymyxin-dexameth  1 application Left Eye BID  . omega-3 acid ethyl esters  1 g Oral Daily  . polyethylene glycol  17 g Oral Daily  . rosuvastatin  5 mg Oral Once per day on Mon Wed Fri  . silver sulfADIAZINE  1 application Topical BID  . sodium chloride flush  3 mL Intravenous Q12H  . spironolactone  25 mg Oral  Daily   Continuous Infusions: . sodium chloride    . furosemide (LASIX) infusion 8 mg/hr (07/25/19 1500)    Assessment/Plan:  1. Acute on chronic diastolic congestive heart failure with anasarca.  Patient's baseline weight is between 245 and 250.  He came in at 275 pounds.  Patient has been on Lasix drip for quite a few days and weight finally starting to come down and is at 257 pounds today.  As per cardiology may need a few more days of diuresis while here.  No beta-blocker with bradycardia. 2. 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  Hemoglobin A1c 7.2.  Creatinine starting to creep up at 2.78.  Patient is on sliding scale insulin.  Discontinued glipizide.  Will start Drew tomorrow. 3. Chronic atrial fibrillation on Eliquis 4. Hyperlipidemia unspecified on Crestor 5. Essential hypertension on hydralazine, spironolactone and Lasix drip. 6. Abdominal swelling.  Ultrasound did not show any fluid.  Code Status:     Code Status Orders  (From admission, onward)         Start     Ordered   07/19/19 1309  Full code  Continuous     07/19/19 1314        Code Status History    Date Active Date Inactive Code Status Order ID Comments User Context   05/07/2019 2201 05/10/2019 0208 Full Code [id number]  Athena Masse, MD ED   07/06/2018 2253 07/12/2018 1932 Full Code [id number]  Lance Coon, MD Inpatient   Advance Care Planning Activity    Advance Directive Documentation     Most Recent Value  Type of Advance Directive  Living will, Healthcare Power of Attorney  Pre-existing out of facility DNR order (yellow form or pink MOST form)  --  "MOST" Form in Place?  --     Family Communication: Spoke with [name] at the bedside Disposition Plan: As per cardiology, the patient will need a few more days of IV diuresis to try to get down to his baseline weight and then convert over to oral medications after that.  Disposition will be home once cleared by  cardiology.  Consultants:  Cardiology  Nephrology  Time spent: 27 minutes, case discussed with cardiology and nephrology  [place]

## 2019-07-25 NOTE — Progress Notes (Signed)
[facility]  ROUNDING NOTE   Subjective:   Sitting up on the side of the bed in a recliner chair Reports that urine output is good  Appetite is fair Continues to have large amount of edema over lower abdomen and both legs Wife at bedside  Objective:  Vital signs in last 24 hours:  Temp:  [97.5 F (36.4 C)-98.2 F (36.8 C)] 97.8 F (36.6 C) (05/04 0731) Pulse Rate:  [53-67] 67 (05/04 0731) Resp:  [18-21] 18 (05/04 0731) BP: (126-155)/(52-69) 152/69 (05/04 0731) SpO2:  [91 %-99 %] 95 % (05/04 0731) Weight:  [116.9 kg] 116.9 kg (05/04 0500)  Weight change: -2.631 kg Filed Weights   07/23/19 0440 07/24/19 0700 07/25/19 0500  Weight: 123.2 kg 119.6 kg 116.9 kg    Intake/Output: I/O last 3 completed shifts: In: 1296 [P.O.:1200; I.V.:96] Out: 4740 [Urine:4740]   Intake/Output this shift:  Total I/O In: 360 [P.O.:360] Out: 500 [Urine:500]  Physical Exam: General:  Sitting up , no acute distress  Head:  Normocephalic  Eyes:  Anicteric  Neck:  Supple, no JVD  Lungs:   Normal effort, decreased breath sounds at bases, room air  Heart:  No rub  Abdomen:   Soft, distended  Extremities:  2-3+ pitting edema over legs and lower abdomen  Neurologic:  Alert, oriented  Skin:  Congestive hyperemia over the legs       Basic Metabolic Panel: Recent Labs  Lab 07/21/19 0517 07/21/19 0517 07/22/19 0514 07/22/19 0514 07/23/19 0553 07/24/19 0634 07/25/19 0549  NA 144  --  140  --  140 140 138  K 3.8  --  3.7  --  3.6 3.5 3.3*  CL 100  --  98  --  96* 95* 93*  CO2 32  --  28  --  31 33* 33*  GLUCOSE 105*  --  115*  --  136* 141* 136*  BUN 57*  --  55*  --  61* 69* 68*  CREATININE 2.54*  --  2.48*  --  2.64* 2.70* 2.78*  CALCIUM 9.0   < > 8.6*   < > 8.8* 8.9 9.0  MG  --   --  2.3  --   --   --   --    < > = values in this interval not displayed.    Liver Function Tests: No results for input(s): AST, ALT, ALKPHOS, BILITOT, PROT, ALBUMIN in the last 168  hours. No results for input(s): LIPASE, AMYLASE in the last 168 hours. No results for input(s): AMMONIA in the last 168 hours.  CBC: Recent Labs  Lab 07/22/19 0514 07/25/19 0549  WBC 7.2 7.6  HGB 10.4* 10.8*  HCT 34.8* 35.4*  MCV 88.3 86.8  PLT 191 186    Cardiac Enzymes: No results for input(s): CKTOTAL, CKMB, CKMBINDEX, TROPONINI in the last 168 hours.  BNP: Invalid input(s): POCBNP  CBG: Recent Labs  Lab 07/23/19 2110 07/24/19 0740 07/24/19 1153 07/24/19 1643 07/25/19 0731  GLUCAP 189* 141* 263* 236* 130*    Microbiology: Results for orders placed or performed during the hospital encounter of 05/07/19  Blood Culture (routine x 2)     Status: None   Collection Time: 05/07/19  6:45 PM   Specimen: BLOOD  Result Value Ref Range Status   Specimen Description BLOOD R AC  Final   Special Requests   Final    BOTTLES DRAWN AEROBIC AND ANAEROBIC Blood Culture results may not be optimal due to an excessive  volume of blood received in culture bottles   Culture   Final    NO GROWTH 5 DAYS Performed at [facility], [address] [place], [place] [address]    Report Status 05/12/2019 FINAL  Final  Blood Culture (routine x 2)     Status: None   Collection Time: 05/07/19  6:45 PM   Specimen: BLOOD  Result Value Ref Range Status   Specimen Description BLOOD R HAND  Final   Special Requests   Final    BOTTLES DRAWN AEROBIC AND ANAEROBIC Blood Culture results may not be optimal due to an inadequate volume of blood received in culture bottles   Culture   Final    NO GROWTH 5 DAYS Performed at [facility], [address], [place], [place] [address]    Report Status 05/12/2019 FINAL  Final  Respiratory Panel by RT PCR (Flu A&B, Covid) - Nasopharyngeal Swab     Status: None   Collection Time: 05/07/19 11:58 PM   Specimen: Nasopharyngeal Swab  Result Value Ref Range Status   SARS Coronavirus 2 by RT PCR NEGATIVE NEGATIVE Final    Comment:  (NOTE) SARS-CoV-2 target nucleic acids are NOT DETECTED. The SARS-CoV-2 RNA is generally detectable in upper respiratoy specimens during the acute phase of infection. The lowest concentration of SARS-CoV-2 viral copies this assay can detect is 131 copies/mL. A negative result does not preclude SARS-Cov-2 infection and should not be used as the sole basis for treatment or other patient management decisions. A negative result may occur with  improper specimen collection/handling, submission of specimen other than nasopharyngeal swab, presence of viral mutation(s) within the areas targeted by this assay, and inadequate number of viral copies (<131 copies/mL). A negative result must be combined with clinical observations, patient history, and epidemiological information. The expected result is Negative. Fact Sheet for Patients:  PinkCheek.be Fact Sheet for Healthcare Providers:  GravelBags.it This test is not yet ap proved or cleared by the [place] FDA and  has been authorized for detection and/or diagnosis of SARS-CoV-2 by FDA under an Emergency Use Authorization (EUA). This EUA will remain  in effect (meaning this test can be used) for the duration of the COVID-19 declaration under Section 564(b)(1) of the Act, 21 U.S.C. section 360bbb-3(b)(1), unless the authorization is terminated or revoked sooner.    Influenza A by PCR NEGATIVE NEGATIVE Final   Influenza B by PCR NEGATIVE NEGATIVE Final    Comment: (NOTE) The Xpert Xpress SARS-CoV-2/FLU/RSV assay is intended as an aid in  the diagnosis of influenza from Nasopharyngeal swab specimens and  should not be used as a sole basis for treatment. Nasal washings and  aspirates are unacceptable for Xpert Xpress SARS-CoV-2/FLU/RSV  testing. Fact Sheet for Patients: PinkCheek.be Fact Sheet for Healthcare  Providers: GravelBags.it This test is not yet approved or cleared by the [place] FDA and  has been authorized for detection and/or diagnosis of SARS-CoV-2 by  FDA under an Emergency Use Authorization (EUA). This EUA will remain  in effect (meaning this test can be used) for the duration of the  Covid-19 declaration under Section 564(b)(1) of the Act, 21  U.S.C. section 360bbb-3(b)(1), unless the authorization is  terminated or revoked. Performed at [facility], [address] [place],  [address]     Coagulation Studies: No results for input(s): LABPROT, INR in the last 72 hours.  Urinalysis: No results for input(s): COLORURINE, LABSPEC, [place], [place], [place], [place], [place], [place], [place], NITRITE, LEUKOCYTESUR  in the last 72 hours.  Invalid input(s): APPERANCEUR    Imaging: No results found.   Medications:   . sodium chloride    . furosemide (LASIX) infusion 8 mg/hr (07/25/19 0335)   . apixaban  2.5 mg Oral BID  . vitamin C  1,000 mg Oral Daily  . cholecalciferol  2,000 Units Oral Daily  . hydrALAZINE  100 mg Oral TID  . insulin aspart  0-20 Units Subcutaneous TID WC  . insulin aspart  6 Units Subcutaneous TID WC  . isosorbide mononitrate  60 mg Oral Daily  . multivitamin with minerals  1 tablet Oral Daily  . neomycin-polymyxin-dexameth  1 application Left Eye BID  . omega-3 acid ethyl esters  1 g Oral Daily  . polyethylene glycol  17 g Oral Daily  . rosuvastatin  5 mg Oral Once per day on Mon Wed Fri  . silver sulfADIAZINE  1 application Topical BID  . sodium chloride flush  3 mL Intravenous Q12H  . spironolactone  25 mg Oral Daily   sodium chloride, acetaminophen, levalbuterol, nitroGLYCERIN, ondansetron (ZOFRAN) IV, sodium chloride flush  Assessment/ Plan:  Kyle Roberson is a 79 y.o. white  male with atrial fibrillation, congestive heart failure, hypertension, coronary artery  disease, diabetes mellitus type II who is admitted to [facility] on 07/19/2019 for Acute on chronic diastolic CHF (congestive heart failure) ([place]) [I50.33]  1. Acute Renal Failure: on chronic kidney disease stage IV. Baseline creatinine of 2.29, GFR of 26 on 06/15/19.  Chronic kidney disease with proteinuria and positive ANA. 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  Acute kidney injury secondary to acute cardio-renal syndrome.  Lab Results  Component Value Date   CREATININE 2.78 (H) 07/25/2019   CREATININE 2.70 (H) 07/24/2019   CREATININE 2.64 (H) 07/23/2019   05/03 0701 - 05/04 0700 In: 124 [P.O.:480; I.V.:96] Out: 2800 [Urine:2800]   2. Acute exacerbation of chronic diastolic congestive heart failure Pulmonary HTN Severe lower extremity edema and lower abdominal edema echocardiogram on 05/08/19 with preserved systolic function.  2D echo from February 15 shows LVEF 55 to 60%, normal LV function, mild LVH, indeterminate left ventricular diastolic parameters Moderately elevated pulmonary artery systolic pressure of 56 mm, moderately dilated pulmonary artery   3. Hypertension:  BP Readings from Last 3 Encounters:  07/25/19 (!) 152/69  07/19/19 126/60  07/19/19 134/70  Avoid hypotension   4. Diabetes mellitus type II with chronic kidney disease: insulin dependent.  Lab Results  Component Value Date   HGBA1C 7.2 (H) 07/19/2019  Avoid ACE inhibitor or ARB while undergoing aggressive diuresis  Plan - Continue furosemide gtt  - Discussed with patient that if medical treatments fail, dialysis is a possibility - Continue spironolactone - started this admission - Fluid restriction, salt restriction and daily standing weights.  - May need evaluation for sleep apnea as outpatient -Cardiology team is planning right heart catheterization    LOS: 6 Reygan Heagle 5/4/202111:08 AM

## 2019-07-25 NOTE — Progress Notes (Signed)
Pt refusing bed alarm, calling out when needing to get up to go to the bathroom. Steady on feet.

## 2019-07-25 NOTE — Evaluation (Signed)
Physical Therapy Evaluation Patient Details Name: Kyle Roberson MRN: [id number] DOB: 05-Nov-1940 Today's Date: 07/25/2019   History of Present Illness  Per MD notes: Pt is an 79 y.o. 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  MD assessment includes: acute on chronic diastolic congestive heart failure with anasarca, chronic kidney disease stage IV, DM, chronic a-fib, HLD, HTN, and abdominal swelling.    Clinical Impression  Pt pleasant and motivated to participate during the session.  Overall patient performed very well during the session.  Pt demonstrated good functional strength and stability with transfers.  Pt was able to amb 30' with an IV and then 47' without an AD with slow cadence but with good stability with SpO2 remaining in the low 90s.  Pt reports feeling close to his baseline from a strength perspective with only mild deficits to activity tolerance compared to prior levels.  Will keep pt on caseload while in acute care to prevent further functional decline but no follow up PT services recommended or desired by patient.        Follow Up Recommendations No PT follow up    Equipment Recommendations  None recommended by PT    Recommendations for Other Services       Precautions / Restrictions Precautions Precautions: Fall Restrictions Weight Bearing Restrictions: No      Mobility  Bed Mobility               General bed mobility comments: NT, pt in recliner  Transfers Overall transfer level: Independent Equipment used: None             General transfer comment: Good eccentric and concentric control  Ambulation/Gait Ambulation/Gait assistance: Supervision Gait Distance (Feet): 30 Feet x  2 Assistive device: None;IV Pole Gait Pattern/deviations: Step-through pattern;Decreased step length - right;Decreased step length - left Gait velocity: decreased   General Gait Details: Pt able to amb with both an IV pole and then without UE support with slow cadence but steady without LOB with SpO2 on room air remaining in the low 90s  Stairs            Wheelchair Mobility    Modified Rankin (Stroke Patients Only)       Balance Overall balance assessment: No apparent balance deficits (not formally assessed)                                           Pertinent Vitals/Pain Pain Assessment: No/denies pain    Home Living Family/patient expects to be discharged to:: Private residence Living Arrangements: Spouse/significant other Available Help at Discharge: Family;Available PRN/intermittently Type of Home: House Home Access: Ramped entrance     Home Layout: One level Home Equipment: Bedside commode;Walker - 2 wheels;Walker - 4 wheels;Grab bars - toilet;Grab bars - tub/shower;Wheelchair - manual;Cane - quad      Prior Function Level of Independence: Independent         Comments: Pt Ind with amb without an AD mostly HH distances with w/c for longer community distances, Ind with ADLs     Hand Dominance        Extremity/Trunk Assessment   Upper Extremity Assessment Upper Extremity Assessment: Overall WFL for tasks  assessed    Lower Extremity Assessment Lower Extremity Assessment: Overall WFL for tasks assessed       Communication   Communication: No difficulties  Cognition Arousal/Alertness: Awake/alert Behavior During Therapy: WFL for tasks assessed/performed Overall Cognitive Status: Within Functional Limits for tasks assessed                                        General Comments      Exercises Other Exercises Other Exercises: HEP education for BLE APs, QS, GS, and LAQs x 10 each every 1-2 hours daily Other  Exercises: Dynamic standing balance with reaching outside BOS without UE support   Assessment/Plan    PT Assessment Patient needs continued PT services  PT Problem List Decreased activity tolerance       PT Treatment Interventions DME instruction;Gait training;Functional mobility training;Therapeutic activities;Therapeutic exercise;Patient/family education    PT Goals (Current goals can be found in the Care Plan section)  Acute Rehab PT Goals Patient Stated Goal: To get home PT Goal Formulation: With patient Time For Goal Achievement: 08/07/19 Potential to Achieve Goals: Good    Frequency Min 2X/week   Barriers to discharge        Co-evaluation               AM-PAC PT "6 Clicks" Mobility  Outcome Measure Help needed turning from your back to your side while in a flat bed without using bedrails?: None Help needed moving from lying on your back to sitting on the side of a flat bed without using bedrails?: None Help needed moving to and from a bed to a chair (including a wheelchair)?: None Help needed standing up from a chair using your arms (e.g., wheelchair or bedside chair)?: None Help needed to walk in hospital room?: A Little Help needed climbing 3-5 steps with a railing? : A Little 6 Click Score: 22    End of Session Equipment Utilized During Treatment: Gait belt Activity Tolerance: Patient tolerated treatment well Patient left: in chair;with family/visitor present;with call bell/phone within reach Nurse Communication: Mobility status;Other (comment)(Pt declined chair alarm) PT Visit Diagnosis: Difficulty in walking, not elsewhere classified (R26.2)    Time: 1010-1042 PT Time Calculation (min) (ACUTE ONLY): 32 min   Charges:   PT Evaluation $PT Eval Low Complexity: 1 Low PT Treatments $Therapeutic Exercise: 8-22 mins        D. Scott Izen Petz PT, DPT 07/25/19, 11:04 AM

## 2019-07-26 LAB — BASIC METABOLIC PANEL
Anion gap: 10 (ref 5–15)
BUN: 66 mg/dL — ABNORMAL HIGH (ref 8–23)
CO2: 34 mmol/L — ABNORMAL HIGH (ref 22–32)
Calcium: 9 mg/dL (ref 8.9–10.3)
Chloride: 91 mmol/L — ABNORMAL LOW (ref 98–111)
Creatinine, Ser: 2.53 mg/dL — ABNORMAL HIGH (ref 0.61–1.24)
GFR calc Af Amer: 27 mL/min — ABNORMAL LOW (ref >60)
GFR calc non Af Amer: 23 mL/min — ABNORMAL LOW (ref >60)
Glucose, Bld: 177 mg/dL — ABNORMAL HIGH (ref 70–99)
Potassium: 3.1 mmol/L — ABNORMAL LOW (ref 3.5–5.1)
Sodium: 135 mmol/L (ref 135–145)

## 2019-07-26 LAB — GLUCOSE, CAPILLARY
Glucose-Capillary: 181 mg/dL — ABNORMAL HIGH (ref 70–99)
Glucose-Capillary: 194 mg/dL — ABNORMAL HIGH (ref 70–99)
Glucose-Capillary: 184 mg/dL — ABNORMAL HIGH (ref 70–99)
Glucose-Capillary: 183 mg/dL — ABNORMAL HIGH (ref 70–99)
Glucose-Capillary: 187 mg/dL — ABNORMAL HIGH (ref 70–99)

## 2019-07-26 NOTE — Progress Notes (Signed)
PROGRESS NOTE    Kyle Roberson  ZLD:[id number] DOB: September 27, 1940 DOA: 07/19/2019 PCP: Glean Hess, MD   Brief Narrative:  Kyle Roberson is a 79 y.o. 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  Admitted for acute on chronic heart failure and started on Lasix infusion for a gentle diuresis with worsening renal function.  Cardiology and nephrology both are following.  Subjective: Patient does not think that he is getting any better.  Assessment & Plan:   Principal Problem:   Acute on chronic diastolic CHF (congestive heart failure) (HCC) Active Problems:   Essential hypertension   Type 2 diabetes mellitus with stage 4 chronic kidney disease (HCC)   Coronary artery disease involving native coronary artery of native heart without angina pectoris   Persistent atrial fibrillation (HCC)   Morbid obesity (HCC)   Abdominal swelling   Anasarca   Chronic a-fib (HCC)   Hyperlipidemia  On chronic diastolic heart failure with anasarca.  Patient with good diuresis and net negative of -13 L.  Weight is down to 253 pound from 275 on admission. Baseline weight of 245 to 50 pounds.  Still appears volume overload. -Continue with Lasix infusion. -Cardiology is following-appreciate their recommendations.  They are recommending right heart cath once euvolemic.  CKD stage IV with type 2 diabetes.  Creatinine seems improving with diuresis. -Neurology is following-appreciate their recommendations. -Monitor  Type 2 diabetes.  A1c of 7.2. -Continue sliding scale. -Discontinue home dose of glipizide and he will need Tradjenta on discharge.  Essential hypertension. -Continue hydralazine, spironolactone and patient is on Lasix  infusion.  Hyperlipidemia. -Continue Crestor  Objective: Vitals:   07/26/19 0330 07/26/19 0811 07/26/19 1154 07/26/19 1528  BP: (!) 149/67 122/83 (!) 150/60 (!) 146/57  Pulse: 64 65 61 (!) 53  Resp: (!) 21 18 18 18   Temp: 97.8 F (36.6 C) 98 F (36.7 C) 98 F (36.7 C) 98.3 F (36.8 C)  TempSrc: Oral Oral Oral Oral  SpO2: 93% 94% 94% 96%  Weight:        Intake/Output Summary (Last 24 hours) at 07/26/2019 1848 Last data filed at 07/26/2019 1330 Gross per 24 hour  Intake 480 ml  Output 2200 ml  Net -1720 ml   Filed Weights   07/24/19 0700 07/25/19 0500 07/26/19 0324  Weight: 119.6 kg 116.9 kg 115 kg    Examination:  General exam: Appears calm and comfortable  Respiratory system: Few basal crackles, respiratory effort normal. Cardiovascular system: S1 & S2 heard, RRR. No JVD, murmurs, rubs, gallops or clicks. Gastrointestinal system: Soft, nontender, nondistended, bowel sounds positive. Central nervous system: Alert and oriented. No focal neurological deficits.Symmetric 5 x 5 power. Extremities: 2+ LE edema with signs of chronic venous stasis, no cyanosis, pulses intact and symmetrical. Psychiatry: Judgement and insight appear normal. Mood & affect appropriate.    DVT prophylaxis: Eliquis Code Status: Full Family Communication: Discussed with patient Disposition Plan:  Status is: Inpatient  Remains inpatient appropriate because:IV treatments appropriate due to intensity of illness or inability to take PO   Dispo: The patient is from: Home              Anticipated d/c is to: Home              Anticipated d/c date is: > 3 days  Patient currently is not medically stable to d/c.  Patient continued to remain volume up, on Lasix infusion.  Will required right heart cath once euvolemic and before discharge.  Consultants:   Cardiology  Nephrology  Procedures:  Antimicrobials:   Data Reviewed: I have personally reviewed following labs and imaging studies   CBC: Recent Labs  Lab 07/22/19 0514 07/25/19 0549  WBC 7.2 7.6  HGB 10.4* 10.8*  HCT 34.8* 35.4*  MCV 88.3 86.8  PLT 191 268   Basic Metabolic Panel: Recent Labs  Lab 07/22/19 0514 07/23/19 0553 07/24/19 0634 07/25/19 0549 07/26/19 0604  NA 140 140 140 138 135  K 3.7 3.6 3.5 3.3* 3.1*  CL 98 96* 95* 93* 91*  CO2 28 31 33* 33* 34*  GLUCOSE 115* 136* 141* 136* 177*  BUN 55* 61* 69* 68* 66*  CREATININE 2.48* 2.64* 2.70* 2.78* 2.53*  CALCIUM 8.6* 8.8* 8.9 9.0 9.0  MG 2.3  --   --   --   --    GFR: Estimated Creatinine Clearance: 28.7 mL/min (A) (by C-G formula based on SCr of 2.53 mg/dL (H)). Liver Function Tests: No results for input(s): AST, ALT, ALKPHOS, BILITOT, PROT, ALBUMIN in the last 168 hours. No results for input(s): LIPASE, AMYLASE in the last 168 hours. No results for input(s): AMMONIA in the last 168 hours. Coagulation Profile: No results for input(s): INR, PROTIME in the last 168 hours. Cardiac Enzymes: No results for input(s): CKTOTAL, CKMB, CKMBINDEX, TROPONINI in the last 168 hours. BNP (last 3 results) No results for input(s): PROBNP in the last 8760 hours. HbA1C: No results for input(s): HGBA1C in the last 72 hours. CBG: Recent Labs  Lab 07/25/19 1641 07/25/19 2102 07/26/19 0811 07/26/19 1155 07/26/19 1632  GLUCAP 181* 194* 184* 183* 187*   Lipid Profile: No results for input(s): CHOL, HDL, LDLCALC, TRIG, CHOLHDL, LDLDIRECT in the last 72 hours. Thyroid Function Tests: No results for input(s): TSH, T4TOTAL, FREET4, T3FREE, THYROIDAB in the last 72 hours. Anemia Panel: No results for input(s): VITAMINB12, FOLATE, FERRITIN, TIBC, IRON, RETICCTPCT in the last 72 hours. Sepsis Labs: No results for input(s): PROCALCITON, LATICACIDVEN in the last 168 hours.  No results found for this or any previous visit (from the past 240 hour(s)).   Radiology Studies: No results found.  Scheduled Meds: . apixaban  2.5 mg Oral BID  . vitamin C  1,000  mg Oral Daily  . cholecalciferol  2,000 Units Oral Daily  . hydrALAZINE  100 mg Oral TID  . insulin aspart  0-20 Units Subcutaneous TID WC  . insulin aspart  6 Units Subcutaneous TID WC  . isosorbide mononitrate  60 mg Oral Daily  . linagliptin  5 mg Oral Daily  . multivitamin with minerals  1 tablet Oral Daily  . neomycin-polymyxin-dexameth  1 application Left Eye BID  . omega-3 acid ethyl esters  1 g Oral Daily  . polyethylene glycol  17 g Oral Daily  . rosuvastatin  5 mg Oral Once per day on Mon Wed Fri  . silver sulfADIAZINE  1 application Topical BID  . sodium chloride flush  3 mL Intravenous Q12H  . spironolactone  25 mg Oral Daily   Continuous Infusions: . sodium chloride    . furosemide (LASIX) infusion 8 mg/hr (07/26/19 1228)     LOS: 7 days   Time spent: 45 minutes.  Lorella Nimrod, MD [facility]  If 7PM-7AM, please contact night-coverage [email or web address]  07/26/2019, 6:48 PM   This record  has been created using Systems analyst. Errors have been sought and corrected,but may not always be located. Such creation errors do not reflect on the standard of care.

## 2019-07-26 NOTE — Progress Notes (Signed)
Progress Note  Patient Name: Kyle Roberson Date of Encounter: 07/26/2019  Primary Cardiologist: Dr. Saunders Revel  Subjective   Patient sitting in chair, still has edema, although he thinks it might have improved somewhat.  Net -3 L over the past 24 hours.  Inpatient Medications    Scheduled Meds: . apixaban  2.5 mg Oral BID  . vitamin C  1,000 mg Oral Daily  . cholecalciferol  2,000 Units Oral Daily  . hydrALAZINE  100 mg Oral TID  . insulin aspart  0-20 Units Subcutaneous TID WC  . insulin aspart  6 Units Subcutaneous TID WC  . isosorbide mononitrate  60 mg Oral Daily  . linagliptin  5 mg Oral Daily  . multivitamin with minerals  1 tablet Oral Daily  . neomycin-polymyxin-dexameth  1 application Left Eye BID  . omega-3 acid ethyl esters  1 g Oral Daily  . polyethylene glycol  17 g Oral Daily  . rosuvastatin  5 mg Oral Once per day on Mon Wed Fri  . silver sulfADIAZINE  1 application Topical BID  . sodium chloride flush  3 mL Intravenous Q12H  . spironolactone  25 mg Oral Daily   Continuous Infusions: . sodium chloride    . furosemide (LASIX) infusion 8 mg/hr (07/26/19 1228)   PRN Meds: sodium chloride, acetaminophen, levalbuterol, nitroGLYCERIN, ondansetron (ZOFRAN) IV, sodium chloride flush   Vital Signs    Vitals:   07/26/19 0324 07/26/19 0330 07/26/19 0811 07/26/19 1154  BP:  (!) 149/67 122/83 (!) 150/60  Pulse:  64 65 61  Resp:  (!) 21 18 18   Temp:  97.8 F (36.6 C) 98 F (36.7 C) 98 F (36.7 C)  TempSrc:  Oral    SpO2:  93% 94% 94%  Weight: 115 kg       Intake/Output Summary (Last 24 hours) at 07/26/2019 1232 Last data filed at 07/26/2019 1013 Gross per 24 hour  Intake 559.97 ml  Output 3200 ml  Net -2640.03 ml   Last 3 Weights 07/26/2019 07/25/2019 07/24/2019  Weight (lbs) 253 lb 9.6 oz 257 lb 12.8 oz 263 lb 9.6 oz  Weight (kg) 115.032 kg 116.937 kg 119.568 kg      Telemetry    Atrial fibrillation, heart rate 56- Personally Reviewed  ECG    No new  tracing obtained- Personally Reviewed  Physical Exam   GEN: No acute distress.   Neck:  Unable to assess Cardiac: irregular irregular, no murmurs, rubs, or gallops.  Respiratory:  Poor inspiratory effort, decreased breath sounds at bases GI: Soft, nontender, distended  MS: 2+ edema; No deformity. Neuro:  Nonfocal  Psych: Normal affect   Labs    High Sensitivity Troponin:   Recent Labs  Lab 07/19/19 1325 07/19/19 1510  TROPONINIHS 17 17      Chemistry Recent Labs  Lab 07/24/19 0634 07/25/19 0549 07/26/19 0604  NA 140 138 135  K 3.5 3.3* 3.1*  CL 95* 93* 91*  CO2 33* 33* 34*  GLUCOSE 141* 136* 177*  BUN 69* 68* 66*  CREATININE 2.70* 2.78* 2.53*  CALCIUM 8.9 9.0 9.0  GFRNONAA 21* 21* 23*  GFRAA 25* 24* 27*  ANIONGAP 12 12 10      Hematology Recent Labs  Lab 07/22/19 0514 07/25/19 0549  WBC 7.2 7.6  RBC 3.94* 4.08*  HGB 10.4* 10.8*  HCT 34.8* 35.4*  MCV 88.3 86.8  MCH 26.4 26.5  MCHC 29.9* 30.5  RDW 15.6* 15.9*  PLT 191 186    BNPNo  results for input(s): BNP, PROBNP in the last 168 hours.   DDimer No results for input(s): DDIMER in the last 168 hours.   Radiology    No results found.  Cardiac Studies   2D echo 04/2019: 1. Left ventricular ejection fraction, by estimation, is 55 to 60%. The  left ventricle has normal function. The left ventricle has no regional  wall motion abnormalities. There is mild left ventricular hypertrophy.  Left ventricular diastolic parameters  are indeterminate.  2. Right ventricular systolic function is normal. The right ventricular  size is normal. There is moderately elevated pulmonary artery systolic  pressure. The estimated right ventricular systolic pressure is 96.2 mmHg.  3. Left atrial size was moderately dilated.  4. The mitral valve is normal in structure and function. Mild to moderate  mitral valve regurgitation. No evidence of mitral stenosis.  5. The aortic valve is normal in structure and function.  Aortic valve  regurgitation is mild. Mild aortic valve sclerosis is present, with no  evidence of aortic valve stenosis.  6. Moderately dilated pulmonary artery.  7. The inferior vena cava is dilated in size with <50% respiratory  variability, suggesting right atrial pressure of 15 mmHg.   Patient Profile     79 y.o. male with history of persistent A. fib, heart failure preserved ejection fraction, diabetes, hypertension, CKD stage IV being seen due to volume overload, heart failure preserved ejection fraction.  Assessment & Plan    1.  Heart failure preserved ejection fraction -Patient is still volume overloaded -Net -3 L over the past 24 hours -Continue Lasix drip, creatinine is remained stable -Replete potassium, magnesium as needed -Right heart cath may be considered after patient is euvolemic.  He currently is volume overloaded and cannot lay flat.  2.  Persistent atrial fibrillation -Heart rate controlled -Continue Eliquis  3.  History of hypertension -BP controlled, continue Imdur, Aldactone, hydralazine.  4. ckd -Cr is stable with diuresing   Total encounter time 35 minutes or more  Greater than 50% was spent in counseling and coordination of care with the patient       Signed, Kate Sable, MD  07/26/2019, 12:32 PM

## 2019-07-26 NOTE — Progress Notes (Signed)
[facility]  ROUNDING NOTE   Subjective:   Sitting up on the side of the bed in a recliner chair Reports that urine output is good  Appetite is fair Continues to have large amount of edema over lower abdomen and both legs   Objective:  Vital signs in last 24 hours:  Temp:  [97.5 F (36.4 C)-98 F (36.7 C)] 98 F (36.7 C) (05/05 1154) Pulse Rate:  [57-65] 61 (05/05 1154) Resp:  [18-21] 18 (05/05 1154) BP: (122-154)/(54-83) 150/60 (05/05 1154) SpO2:  [93 %-96 %] 94 % (05/05 1154) Weight:  [259 kg] 115 kg (05/05 0324)  Weight change: -1.905 kg Filed Weights   07/24/19 0700 07/25/19 0500 07/26/19 0324  Weight: 119.6 kg 116.9 kg 115 kg    Intake/Output: I/O last 3 completed shifts: In: 53 [P.O.:600; I.V.:176] Out: 4700 [Urine:4700]   Intake/Output this shift:  Total I/O In: 240 [P.O.:240] Out: -   Physical Exam: General:  Sitting up , no acute distress  Head:  Normocephalic  Eyes:  Anicteric  Neck:  Supple, no JVD  Lungs:   Normal effort, decreased breath sounds at bases, room air  Heart:  No rub  Abdomen:   Soft, distended  Extremities:  2-3+ pitting edema over legs and lower abdomen  Neurologic:  Alert, oriented  Skin:  Congestive hyperemia over the legs       Basic Metabolic Panel: Recent Labs  Lab 07/22/19 0514 07/22/19 0514 07/23/19 0553 07/23/19 0553 07/24/19 0634 07/25/19 0549 07/26/19 0604  NA 140  --  140  --  140 138 135  K 3.7  --  3.6  --  3.5 3.3* 3.1*  CL 98  --  96*  --  95* 93* 91*  CO2 28  --  31  --  33* 33* 34*  GLUCOSE 115*  --  136*  --  141* 136* 177*  BUN 55*  --  61*  --  69* 68* 66*  CREATININE 2.48*  --  2.64*  --  2.70* 2.78* 2.53*  CALCIUM 8.6*   < > 8.8*   < > 8.9 9.0 9.0  MG 2.3  --   --   --   --   --   --    < > = values in this interval not displayed.    Liver Function Tests: No results for input(s): AST, ALT, ALKPHOS, BILITOT, PROT, ALBUMIN in the last 168 hours. No results for input(s): LIPASE,  AMYLASE in the last 168 hours. No results for input(s): AMMONIA in the last 168 hours.  CBC: Recent Labs  Lab 07/22/19 0514 07/25/19 0549  WBC 7.2 7.6  HGB 10.4* 10.8*  HCT 34.8* 35.4*  MCV 88.3 86.8  PLT 191 186    Cardiac Enzymes: No results for input(s): CKTOTAL, CKMB, CKMBINDEX, TROPONINI in the last 168 hours.  BNP: Invalid input(s): POCBNP  CBG: Recent Labs  Lab 07/25/19 0731 07/25/19 1122 07/25/19 1641 07/25/19 2102 07/26/19 0811  GLUCAP 130* 246* 181* 194* 184*    Microbiology: Results for orders placed or performed during the hospital encounter of 05/07/19  Blood Culture (routine x 2)     Status: None   Collection Time: 05/07/19  6:45 PM   Specimen: BLOOD  Result Value Ref Range Status   Specimen Description BLOOD R AC  Final   Special Requests   Final    BOTTLES DRAWN AEROBIC AND ANAEROBIC Blood Culture results may not be optimal due to an excessive volume of  blood received in culture bottles   Culture   Final    NO GROWTH 5 DAYS Performed at [facility], [address] [place], [place] [address]    Report Status 05/12/2019 FINAL  Final  Blood Culture (routine x 2)     Status: None   Collection Time: 05/07/19  6:45 PM   Specimen: BLOOD  Result Value Ref Range Status   Specimen Description BLOOD R HAND  Final   Special Requests   Final    BOTTLES DRAWN AEROBIC AND ANAEROBIC Blood Culture results may not be optimal due to an inadequate volume of blood received in culture bottles   Culture   Final    NO GROWTH 5 DAYS Performed at [facility], [address] [place], [place] [address]    Report Status 05/12/2019 FINAL  Final  Respiratory Panel by RT PCR (Flu A&B, Covid) - Nasopharyngeal Swab     Status: None   Collection Time: 05/07/19 11:58 PM   Specimen: Nasopharyngeal Swab  Result Value Ref Range Status   SARS Coronavirus 2 by RT PCR NEGATIVE NEGATIVE Final    Comment: (NOTE) SARS-CoV-2 target nucleic acids are NOT  DETECTED. The SARS-CoV-2 RNA is generally detectable in upper respiratoy specimens during the acute phase of infection. The lowest concentration of SARS-CoV-2 viral copies this assay can detect is 131 copies/mL. A negative result does not preclude SARS-Cov-2 infection and should not be used as the sole basis for treatment or other patient management decisions. A negative result may occur with  improper specimen collection/handling, submission of specimen other than nasopharyngeal swab, presence of viral mutation(s) within the areas targeted by this assay, and inadequate number of viral copies (<131 copies/mL). A negative result must be combined with clinical observations, patient history, and epidemiological information. The expected result is Negative. Fact Sheet for Patients:  PinkCheek.be Fact Sheet for Healthcare Providers:  GravelBags.it This test is not yet ap proved or cleared by the [place] FDA and  has been authorized for detection and/or diagnosis of SARS-CoV-2 by FDA under an Emergency Use Authorization (EUA). This EUA will remain  in effect (meaning this test can be used) for the duration of the COVID-19 declaration under Section 564(b)(1) of the Act, 21 U.S.C. section 360bbb-3(b)(1), unless the authorization is terminated or revoked sooner.    Influenza A by PCR NEGATIVE NEGATIVE Final   Influenza B by PCR NEGATIVE NEGATIVE Final    Comment: (NOTE) The Xpert Xpress SARS-CoV-2/FLU/RSV assay is intended as an aid in  the diagnosis of influenza from Nasopharyngeal swab specimens and  should not be used as a sole basis for treatment. Nasal washings and  aspirates are unacceptable for Xpert Xpress SARS-CoV-2/FLU/RSV  testing. Fact Sheet for Patients: PinkCheek.be Fact Sheet for Healthcare Providers: GravelBags.it This test is not yet approved or cleared  by the [place] FDA and  has been authorized for detection and/or diagnosis of SARS-CoV-2 by  FDA under an Emergency Use Authorization (EUA). This EUA will remain  in effect (meaning this test can be used) for the duration of the  Covid-19 declaration under Section 564(b)(1) of the Act, 21  U.S.C. section 360bbb-3(b)(1), unless the authorization is  terminated or revoked. Performed at [facility], [address] [place], [place] [address]     Coagulation Studies: No results for input(s): LABPROT, INR in the last 72 hours.  Urinalysis: No results for input(s): COLORURINE, LABSPEC, PHURINE, GLUCOSEU, HGBUR, BILIRUBINUR, KETONESUR, PROTEINUR, UROBILINOGEN, NITRITE, LEUKOCYTESUR in the  last 72 hours.  Invalid input(s): APPERANCEUR    Imaging: No results found.   Medications:   . sodium chloride    . furosemide (LASIX) infusion 8 mg/hr (07/25/19 1500)   . apixaban  2.5 mg Oral BID  . vitamin C  1,000 mg Oral Daily  . cholecalciferol  2,000 Units Oral Daily  . hydrALAZINE  100 mg Oral TID  . insulin aspart  0-20 Units Subcutaneous TID WC  . insulin aspart  6 Units Subcutaneous TID WC  . isosorbide mononitrate  60 mg Oral Daily  . linagliptin  5 mg Oral Daily  . multivitamin with minerals  1 tablet Oral Daily  . neomycin-polymyxin-dexameth  1 application Left Eye BID  . omega-3 acid ethyl esters  1 g Oral Daily  . polyethylene glycol  17 g Oral Daily  . rosuvastatin  5 mg Oral Once per day on Mon Wed Fri  . silver sulfADIAZINE  1 application Topical BID  . sodium chloride flush  3 mL Intravenous Q12H  . spironolactone  25 mg Oral Daily   sodium chloride, acetaminophen, levalbuterol, nitroGLYCERIN, ondansetron (ZOFRAN) IV, sodium chloride flush  Assessment/ Plan:  [name] is a 79 y.o. white  male with atrial fibrillation, congestive heart failure, hypertension, coronary artery disease, diabetes mellitus type II who is admitted to [facility] on  07/19/2019 for Acute on chronic diastolic CHF (congestive heart failure) ([place]) [I50.33]  1. Acute Renal Failure: on chronic kidney disease stage IV. Baseline creatinine of 2.29, GFR of 26 on 06/15/19.  Chronic kidney disease with proteinuria and positive ANA. 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  Acute kidney injury secondary to acute cardio-renal syndrome.  Lab Results  Component Value Date   CREATININE 2.53 (H) 07/26/2019   CREATININE 2.78 (H) 07/25/2019   CREATININE 2.70 (H) 07/24/2019   05/04 0701 - 05/05 0700 In: 680 [P.O.:600; I.V.:80] Out: 3700 [Urine:3700]   2. Acute exacerbation of chronic diastolic congestive heart failure Pulmonary HTN Severe lower extremity edema and lower abdominal edema echocardiogram on 05/08/19 with preserved systolic function.  2D echo from February 15 shows LVEF 55 to 60%, normal LV function, mild LVH, indeterminate left ventricular diastolic parameters Moderately elevated pulmonary artery systolic pressure of 56 mm, moderately dilated pulmonary artery   3. Hypertension:  BP Readings from Last 3 Encounters:  07/26/19 (!) 150/60  07/19/19 126/60  07/19/19 134/70  Avoid hypotension   4. Diabetes mellitus type II with chronic kidney disease: insulin dependent.  Lab Results  Component Value Date   HGBA1C 7.2 (H) 07/19/2019  Avoid ACE inhibitor or ARB while undergoing aggressive diuresis  Plan - Continue furosemide gtt  - Discussed with patient that if medical treatments fail, dialysis is a possibility - Continue spironolactone - started this admission - Fluid restriction, salt restriction and daily standing weights.  Weight is down to 115 kg this morning - May need evaluation for sleep apnea as outpatient -Cardiology team is planning right heart catheterization in future    LOS: [place] 5/5/202112:18 PM

## 2019-07-27 ENCOUNTER — Ambulatory Visit: Payer: Medicare HMO | Admitting: Internal Medicine

## 2019-07-27 ENCOUNTER — Encounter (HOSPITAL_COMMUNITY): Payer: Medicare HMO | Admitting: Internal Medicine

## 2019-07-27 LAB — RENAL FUNCTION PANEL
Albumin: 3.6 g/dL (ref 3.5–5.0)
Anion gap: 12 (ref 5–15)
BUN: 68 mg/dL — ABNORMAL HIGH (ref 8–23)
CO2: 33 mmol/L — ABNORMAL HIGH (ref 22–32)
Calcium: 9.2 mg/dL (ref 8.9–10.3)
Chloride: 91 mmol/L — ABNORMAL LOW (ref 98–111)
Creatinine, Ser: 2.53 mg/dL — ABNORMAL HIGH (ref 0.61–1.24)
GFR calc Af Amer: 27 mL/min — ABNORMAL LOW (ref >60)
GFR calc non Af Amer: 23 mL/min — ABNORMAL LOW (ref >60)
Glucose, Bld: 161 mg/dL — ABNORMAL HIGH (ref 70–99)
Phosphorus: 4.1 mg/dL (ref 2.5–4.6)
Potassium: 3 mmol/L — ABNORMAL LOW (ref 3.5–5.1)
Sodium: 136 mmol/L (ref 135–145)

## 2019-07-27 LAB — GLUCOSE, CAPILLARY
Glucose-Capillary: 209 mg/dL — ABNORMAL HIGH (ref 70–99)
Glucose-Capillary: 163 mg/dL — ABNORMAL HIGH (ref 70–99)
Glucose-Capillary: 241 mg/dL — ABNORMAL HIGH (ref 70–99)

## 2019-07-27 MED ORDER — ISOSORBIDE MONONITRATE ER 60 MG PO TB24
90.0000 mg | ORAL_TABLET | Freq: Every day | ORAL | Status: DC
  Administered 2019-07-28: 90 mg via ORAL
  Filled 2019-07-27: qty 1

## 2019-07-27 MED ORDER — POTASSIUM CHLORIDE CRYS ER 20 MEQ PO TBCR
40.0000 meq | EXTENDED_RELEASE_TABLET | Freq: Every day | ORAL | Status: DC
  Administered 2019-07-27 – 2019-07-28 (×2): 40 meq via ORAL
  Filled 2019-07-27 (×3): qty 2

## 2019-07-27 MED ORDER — ISOSORBIDE MONONITRATE ER 30 MG PO TB24
30.0000 mg | ORAL_TABLET | Freq: Once | ORAL | Status: AC
  Administered 2019-07-27: 30 mg via ORAL
  Filled 2019-07-27: qty 1

## 2019-07-27 MED ORDER — DIPHENHYDRAMINE HCL 25 MG PO CAPS: 25.0000 mg | ORAL_CAPSULE | Freq: Four times a day (QID) | ORAL | Status: DC | PRN

## 2019-07-27 MED ORDER — METOLAZONE 2.5 MG PO TABS
2.5000 mg | ORAL_TABLET | Freq: Every day | ORAL | Status: DC
  Administered 2019-07-27: 15:00:00 2.5 mg via ORAL
  Filled 2019-07-27 (×2): qty 1

## 2019-07-27 NOTE — Care Management Important Message (Signed)
Important Message  Patient Details  Name: Teo Moede MRN: [id number] Date of Birth: 10-22-1940   Medicare Important Message Given:  Yes     Dannette Barbara 07/27/2019, 2:51 PM

## 2019-07-27 NOTE — Progress Notes (Signed)
PROGRESS NOTE    Rodolph Hagemann  QZR:[id number] DOB: June 04, 1940 DOA: 07/19/2019 PCP: Glean Hess, MD   Brief Narrative:  Kyle Roberson is a 79 y.o. 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  Admitted for acute on chronic heart failure and started on Lasix infusion for a gentle diuresis with worsening renal function.  Cardiology and nephrology both are following.  Subjective: Patient was feeling little better when seen today.  He was sitting in the chair.  Able to walk around in the room without significant dyspnea.  Assessment & Plan:   Principal Problem:   Acute on chronic diastolic CHF (congestive heart failure) (HCC) Active Problems:   Essential hypertension   Type 2 diabetes mellitus with stage 4 chronic kidney disease (HCC)   Coronary artery disease involving native coronary artery of native heart without angina pectoris   Persistent atrial fibrillation (HCC)   Morbid obesity (HCC)   Abdominal swelling   Anasarca   Chronic a-fib (HCC)   Hyperlipidemia  On chronic diastolic heart failure with anasarca.  Patient with good diuresis and net negative of -13 L.  Weight is down to 250 pound from 275 on admission. Baseline weight of 245 to 250 pounds.  Still appears volume overload. Renal function seems stable. -Continue with Lasix infusion. -Cardiology is following-appreciate their recommendations.  They are recommending right heart cath once euvolemic.  CKD stage IV with type 2 diabetes.  Creatinine stable at 2.53 with baseline of 2.29. -Neurology is following-appreciate their recommendations. -Monitor  Type 2 diabetes.  A1c of 7.2. -Continue sliding scale. -Discontinue home dose of glipizide and he  will need Tradjenta on discharge.  Essential hypertension. -Continue hydralazine, spironolactone and patient is on Lasix infusion.  Hyperlipidemia. -Continue Crestor  Objective: Vitals:   07/26/19 1942 07/27/19 0431 07/27/19 0748 07/27/19 1133  BP: (!) 128/56 (!) 134/59 (!) 140/49 133/62  Pulse: 66 60 (!) 55 (!) 57  Resp: 18  18 16   Temp:  97.6 F (36.4 C) 97.9 F (36.6 C) 97.6 F (36.4 C)  TempSrc:  Oral Oral Oral  SpO2: 91% 95% 94% 97%  Weight:  113.8 kg      Intake/Output Summary (Last 24 hours) at 07/27/2019 1339 Last data filed at 07/27/2019 1207 Gross per 24 hour  Intake 360 ml  Output 1700 ml  Net -1340 ml   Filed Weights   07/25/19 0500 07/26/19 0324 07/27/19 0431  Weight: 116.9 kg 115 kg 113.8 kg    Examination:  General exam: Appears calm and comfortable  Respiratory system: Few basal crackles, respiratory effort normal. Cardiovascular system: S1 & S2 heard, RRR. No JVD, murmurs, rubs, gallops or clicks. Gastrointestinal system: Soft, nontender, nondistended, bowel sounds positive. Central nervous system: Alert and oriented. No focal neurological deficits.Symmetric 5 x 5 power. Extremities: 2+ LE edema with signs of chronic venous stasis, no cyanosis, pulses intact and symmetrical. Psychiatry: Judgement and insight appear normal.    DVT prophylaxis: Eliquis Code Status: Full Family Communication: Discussed with patient Disposition Plan:  Status is: Inpatient  Remains inpatient appropriate because:IV treatments appropriate due to intensity of illness or inability to take PO   Dispo: The patient is from: Home              Anticipated d/c is to: Home  Anticipated d/c date is: > 3 days              Patient currently is not medically stable to d/c.  Patient continued to remain volume up, on Lasix infusion.  Will required right heart cath once euvolemic and before discharge.  Consultants:   Cardiology  Nephrology  Procedures:    Antimicrobials:   Data Reviewed: I have personally reviewed following labs and imaging studies  CBC: Recent Labs  Lab 07/22/19 0514 07/25/19 0549  WBC 7.2 7.6  HGB 10.4* 10.8*  HCT 34.8* 35.4*  MCV 88.3 86.8  PLT 191 299   Basic Metabolic Panel: Recent Labs  Lab 07/22/19 0514 07/22/19 0514 07/23/19 0553 07/24/19 0634 07/25/19 0549 07/26/19 0604 07/27/19 0447  NA 140   < > 140 140 138 135 136  K 3.7   < > 3.6 3.5 3.3* 3.1* 3.0*  CL 98   < > 96* 95* 93* 91* 91*  CO2 28   < > 31 33* 33* 34* 33*  GLUCOSE 115*   < > 136* 141* 136* 177* 161*  BUN 55*   < > 61* 69* 68* 66* 68*  CREATININE 2.48*   < > 2.64* 2.70* 2.78* 2.53* 2.53*  CALCIUM 8.6*   < > 8.8* 8.9 9.0 9.0 9.2  MG 2.3  --   --   --   --   --   --   PHOS  --   --   --   --   --   --  4.1   < > = values in this interval not displayed.   GFR: Estimated Creatinine Clearance: 28.5 mL/min (A) (by C-G formula based on SCr of 2.53 mg/dL (H)). Liver Function Tests: Recent Labs  Lab 07/27/19 0447  ALBUMIN 3.6   No results for input(s): LIPASE, AMYLASE in the last 168 hours. No results for input(s): AMMONIA in the last 168 hours. Coagulation Profile: No results for input(s): INR, PROTIME in the last 168 hours. Cardiac Enzymes: No results for input(s): CKTOTAL, CKMB, CKMBINDEX, TROPONINI in the last 168 hours. BNP (last 3 results) No results for input(s): PROBNP in the last 8760 hours. HbA1C: No results for input(s): HGBA1C in the last 72 hours. CBG: Recent Labs  Lab 07/26/19 1155 07/26/19 1632 07/26/19 2139 07/27/19 0745 07/27/19 1135  GLUCAP 183* 187* 209* 163* 241*   Lipid Profile: No results for input(s): CHOL, HDL, LDLCALC, TRIG, CHOLHDL, LDLDIRECT in the last 72 hours. Thyroid Function Tests: No results for input(s): TSH, T4TOTAL, FREET4, T3FREE, THYROIDAB in the last 72 hours. Anemia Panel: No results for input(s): VITAMINB12, FOLATE, FERRITIN, TIBC, IRON, RETICCTPCT in the last 72 hours. Sepsis  Labs: No results for input(s): PROCALCITON, LATICACIDVEN in the last 168 hours.  No results found for this or any previous visit (from the past 240 hour(s)).   Radiology Studies: No results found.  Scheduled Meds: . apixaban  2.5 mg Oral BID  . vitamin C  1,000 mg Oral Daily  . cholecalciferol  2,000 Units Oral Daily  . hydrALAZINE  100 mg Oral TID  . insulin aspart  0-20 Units Subcutaneous TID WC  . insulin aspart  6 Units Subcutaneous TID WC  . [START ON 07/28/2019] isosorbide mononitrate  90 mg Oral Daily  . linagliptin  5 mg Oral Daily  . multivitamin with minerals  1 tablet Oral Daily  . neomycin-polymyxin-dexameth  1 application Left Eye BID  . omega-3 acid ethyl esters  1 g Oral Daily  .  polyethylene glycol  17 g Oral Daily  . potassium chloride  40 mEq Oral Daily  . rosuvastatin  5 mg Oral Once per day on Mon Wed Fri  . silver sulfADIAZINE  1 application Topical BID  . sodium chloride flush  3 mL Intravenous Q12H  . spironolactone  25 mg Oral Daily   Continuous Infusions: . sodium chloride    . furosemide (LASIX) infusion 8 mg/hr (07/26/19 1228)     LOS: 8 days   Time spent: 40 minutes.  Lorella Nimrod, MD [facility]  If 7PM-7AM, please contact night-coverage [email or web address]  07/27/2019, 1:39 PM   This record has been created using Systems analyst. Errors have been sought and corrected,but may not always be located. Such creation errors do not reflect on the standard of care.

## 2019-07-27 NOTE — Progress Notes (Signed)
PT Cancellation Note  Patient Details Name: Kyle Roberson MRN: [id number] DOB: April 12, 1940   Cancelled Treatment:    Reason Eval/Treat Not Completed: Patient declined, no reason specified   Offered and encouraged session.  Pt stated he is up and walking on his own in the room and voices no concern for mobility.  He declines further gait in hallway out of fear of "catching something."  Declined any in room interventions.  Chesley Noon 07/27/2019, 12:11 PM

## 2019-07-27 NOTE — Progress Notes (Signed)
Progress Note  Patient Name: Kyle Roberson Date of Encounter: 07/27/2019  Primary Cardiologist: Nelva Bush, MD  Subjective   Swelling slowly improving.  Tired of being in hospital.  Breathing stable.  Inpatient Medications    Scheduled Meds: . apixaban  2.5 mg Oral BID  . vitamin C  1,000 mg Oral Daily  . cholecalciferol  2,000 Units Oral Daily  . hydrALAZINE  100 mg Oral TID  . insulin aspart  0-20 Units Subcutaneous TID WC  . insulin aspart  6 Units Subcutaneous TID WC  . isosorbide mononitrate  60 mg Oral Daily  . linagliptin  5 mg Oral Daily  . multivitamin with minerals  1 tablet Oral Daily  . neomycin-polymyxin-dexameth  1 application Left Eye BID  . omega-3 acid ethyl esters  1 g Oral Daily  . polyethylene glycol  17 g Oral Daily  . potassium chloride  40 mEq Oral Daily  . rosuvastatin  5 mg Oral Once per day on Mon Wed Fri  . silver sulfADIAZINE  1 application Topical BID  . sodium chloride flush  3 mL Intravenous Q12H  . spironolactone  25 mg Oral Daily   Continuous Infusions: . sodium chloride    . furosemide (LASIX) infusion 8 mg/hr (07/26/19 1228)   PRN Meds: sodium chloride, acetaminophen, levalbuterol, nitroGLYCERIN, ondansetron (ZOFRAN) IV, sodium chloride flush   Vital Signs    Vitals:   07/26/19 1528 07/26/19 1942 07/27/19 0431 07/27/19 0748  BP: (!) 146/57 (!) 128/56 (!) 134/59 (!) 140/49  Pulse: (!) 53 66 60 (!) 55  Resp: 18 18  18   Temp: 98.3 F (36.8 C)  97.6 F (36.4 C) 97.9 F (36.6 C)  TempSrc: Oral  Oral Oral  SpO2: 96% 91% 95% 94%  Weight:   113.8 kg     Intake/Output Summary (Last 24 hours) at 07/27/2019 0925 Last data filed at 07/27/2019 0749 Gross per 24 hour  Intake 600 ml  Output 1400 ml  Net -800 ml   Filed Weights   07/25/19 0500 07/26/19 0324 07/27/19 0431  Weight: 116.9 kg 115 kg 113.8 kg    Physical Exam   GEN: Well nourished, well developed, in no acute distress.  HEENT: Grossly normal.  Neck: Supple,  JVP mod elevated, no carotid bruits, or masses. Cardiac: IR, IR, 2/6 SEM upper sternal borders, no rubs, or gallops. No clubbing, cyanosis.  2-3+ bilat LE edema to posterior thighs.  Radials/DP/PT 2+ and equal bilaterally.  Respiratory:  Respirations regular and unlabored, bibasilar crackles. GI: Protuberant, semi-firm w/ flank edema, BS + x 4. MS: no deformity or atrophy. Skin: warm and dry, no rash. Neuro:  Strength and sensation are intact. Psych: AAOx3.  Normal affect.  Labs    Chemistry Recent Labs  Lab 07/25/19 0549 07/26/19 0604 07/27/19 0447  NA 138 135 136  K 3.3* 3.1* 3.0*  CL 93* 91* 91*  CO2 33* 34* 33*  GLUCOSE 136* 177* 161*  BUN 68* 66* 68*  CREATININE 2.78* 2.53* 2.53*  CALCIUM 9.0 9.0 9.2  ALBUMIN  --   --  3.6  GFRNONAA 21* 23* 23*  GFRAA 24* 27* 27*  ANIONGAP 12 10 12      Hematology Recent Labs  Lab 07/22/19 0514 07/25/19 0549  WBC 7.2 7.6  RBC 3.94* 4.08*  HGB 10.4* 10.8*  HCT 34.8* 35.4*  MCV 88.3 86.8  MCH 26.4 26.5  MCHC 29.9* 30.5  RDW 15.6* 15.9*  PLT 191 186    Cardiac Enzymes  Recent Labs  Lab 07/19/19 1325 07/19/19 1510  TROPONINIHS 17 17       Radiology    No results found.  Telemetry    Afib 50s to 70s, rare PVCs - Personally Reviewed  Cardiac Studies   2D echo 04/2019: 1. Left ventricular ejection fraction, by estimation, is 55 to 60%. The  left ventricle has normal function. The left ventricle has no regional  wall motion abnormalities. There is mild left ventricular hypertrophy.  Left ventricular diastolic parameters  are indeterminate.   2. Right ventricular systolic function is normal. The right ventricular  size is normal. There is moderately elevated pulmonary artery systolic  pressure. The estimated right ventricular systolic pressure is 24.0 mmHg.   3. Left atrial size was moderately dilated.   4. The mitral valve is normal in structure and function. Mild to moderate  mitral valve regurgitation. No  evidence of mitral stenosis.   5. The aortic valve is normal in structure and function. Aortic valve  regurgitation is mild. Mild aortic valve sclerosis is present, with no  evidence of aortic valve stenosis.   6. Moderately dilated pulmonary artery.   7. The inferior vena cava is dilated in size with <50% respiratory  variability, suggesting right atrial pressure of 15 mmHg.    Patient Profile     79 y.o. male with history of persistent A. fib, heart failure preserved ejection fraction, diabetes, hypertension, HL CKD stage IV being seen due to volume overload, heart failure preserved ejection fraction.   Assessment & Plan    1.  Acute HFpEF:  EF 55-60% by echo 04/2019.  Admitted 4/28 from the office in the setting of volume overload that failed to respond to outpt oral diuretic adjustments.  He has been responding well to IV lasix gtt and was minus 1.1 L yesterday and 15L for admission. Wt has dropped from 122.9kg on admission to 113.8kg this AM.  Previous dry wt of 108.9 kg in 12/2018.  He remains markedly volume overloaded on exam.  Renal fxn stable.  Cont lasix gtt, hydral/nitrate, spiro.  Will push imdur to 90 daily.  No  blocker in setting of baseline bradycardia. No acei/arb/arni 2/2 CKD IV.  2.  Essential HTN:  BP trending 140s.  Will push imdur to 90 daily.  He was prev on amlodipine however this was d/c'd on admission due to lower ext edema.  3.  Persistent Atrial Fibrillation: Rate controlled w/o AVN blocking agent.  Cont reduced dose eliquis.  4.  CKD IV:  Stable.  Nephrology following.  5.  DMII: A1c 7.2 on 4/28.  Insulin mgmt per IM.  6.  Hypokalemia:  K 3.0 this AM.  Oral supplementation ordered.  Signed, Murray Hodgkins, NP  07/27/2019, 9:25 AM    For questions or updates, please contact   Please consult [email or web address] for contact info under Cardiology/STEMI.

## 2019-07-27 NOTE — Plan of Care (Signed)
  Problem: Safety: Goal: Ability to remain free from injury will improve Outcome: Progressing   

## 2019-07-27 NOTE — Progress Notes (Signed)
[facility]  ROUNDING NOTE   Subjective:   Sitting up on the side of the bed in a recliner chair Reports that urine output is good  Appetite is fair Continues to have large amount of edema over lower abdomen and both legs are improving slowly Weight is down to 113.8 kg today   Objective:  Vital signs in last 24 hours:  Temp:  [97.6 F (36.4 C)-98.3 F (36.8 C)] 97.6 F (36.4 C) (05/06 1133) Pulse Rate:  [53-66] 57 (05/06 1133) Resp:  [16-18] 16 (05/06 1133) BP: (128-146)/(49-62) 133/62 (05/06 1133) SpO2:  [91 %-97 %] 97 % (05/06 1133) Weight:  [113.8 kg] 113.8 kg (05/06 0431)  Weight change: -1.27 kg Filed Weights   07/25/19 0500 07/26/19 0324 07/27/19 0431  Weight: 116.9 kg 115 kg 113.8 kg    Intake/Output: I/O last 3 completed shifts: In: 600 [P.O.:600] Out: 3300 [Urine:3300]   Intake/Output this shift:  Total I/O In: 240 [P.O.:240] Out: 600 [Urine:600]  Physical Exam: General:  Sitting up , no acute distress  Head:  Normocephalic  Eyes:  Anicteric  Neck:  Supple, no JVD  Lungs:   Normal effort, decreased breath sounds at bases, room air  Heart:  No rub  Abdomen:   Soft, distended  Extremities:  2-3+ pitting edema over legs up to thighs  Neurologic:  Alert, oriented  Skin:  Congestive hyperemia over the legs       Basic Metabolic Panel: Recent Labs  Lab 07/22/19 0514 07/22/19 0514 07/23/19 0553 07/23/19 0553 07/24/19 0634 07/24/19 0634 07/25/19 0549 07/26/19 0604 07/27/19 0447  NA 140   < > 140  --  140  --  138 135 136  K 3.7   < > 3.6  --  3.5  --  3.3* 3.1* 3.0*  CL 98   < > 96*  --  95*  --  93* 91* 91*  CO2 28   < > 31  --  33*  --  33* 34* 33*  GLUCOSE 115*   < > 136*  --  141*  --  136* 177* 161*  BUN 55*   < > 61*  --  69*  --  68* 66* 68*  CREATININE 2.48*   < > 2.64*  --  2.70*  --  2.78* 2.53* 2.53*  CALCIUM 8.6*   < > 8.8*   < > 8.9   < > 9.0 9.0 9.2  MG 2.3  --   --   --   --   --   --   --   --   PHOS  --   --    --   --   --   --   --   --  4.1   < > = values in this interval not displayed.    Liver Function Tests: Recent Labs  Lab 07/27/19 0447  ALBUMIN 3.6   No results for input(s): LIPASE, AMYLASE in the last 168 hours. No results for input(s): AMMONIA in the last 168 hours.  CBC: Recent Labs  Lab 07/22/19 0514 07/25/19 0549  WBC 7.2 7.6  HGB 10.4* 10.8*  HCT 34.8* 35.4*  MCV 88.3 86.8  PLT 191 186    Cardiac Enzymes: No results for input(s): CKTOTAL, CKMB, CKMBINDEX, TROPONINI in the last 168 hours.  BNP: Invalid input(s): POCBNP  CBG: Recent Labs  Lab 07/26/19 1155 07/26/19 1632 07/26/19 2139 07/27/19 0745 07/27/19 1135  GLUCAP 183* 187* 209* 163* 241*  Microbiology: Results for orders placed or performed during the hospital encounter of 05/07/19  Blood Culture (routine x 2)     Status: None   Collection Time: 05/07/19  6:45 PM   Specimen: BLOOD  Result Value Ref Range Status   Specimen Description BLOOD R AC  Final   Special Requests   Final    BOTTLES DRAWN AEROBIC AND ANAEROBIC Blood Culture results may not be optimal due to an excessive volume of blood received in culture bottles   Culture   Final    NO GROWTH 5 DAYS Performed at [facility], [address] [place], [place] [address]    Report Status 05/12/2019 FINAL  Final  Blood Culture (routine x 2)     Status: None   Collection Time: 05/07/19  6:45 PM   Specimen: BLOOD  Result Value Ref Range Status   Specimen Description BLOOD R HAND  Final   Special Requests   Final    BOTTLES DRAWN AEROBIC AND ANAEROBIC Blood Culture results may not be optimal due to an inadequate volume of blood received in culture bottles   Culture   Final    NO GROWTH 5 DAYS Performed at [facility], [address] [place], [place] [address]    Report Status 05/12/2019 FINAL  Final  Respiratory Panel by RT PCR (Flu A&B, Covid) - Nasopharyngeal Swab     Status: None   Collection Time: 05/07/19  11:58 PM   Specimen: Nasopharyngeal Swab  Result Value Ref Range Status   SARS Coronavirus 2 by RT PCR NEGATIVE NEGATIVE Final    Comment: (NOTE) SARS-CoV-2 target nucleic acids are NOT DETECTED. The SARS-CoV-2 RNA is generally detectable in upper respiratoy specimens during the acute phase of infection. The lowest concentration of SARS-CoV-2 viral copies this assay can detect is 131 copies/mL. A negative result does not preclude SARS-Cov-2 infection and should not be used as the sole basis for treatment or other patient management decisions. A negative result may occur with  improper specimen collection/handling, submission of specimen other than nasopharyngeal swab, presence of viral mutation(s) within the areas targeted by this assay, and inadequate number of viral copies (<131 copies/mL). A negative result must be combined with clinical observations, patient history, and epidemiological information. The expected result is Negative. Fact Sheet for Patients:  PinkCheek.be Fact Sheet for Healthcare Providers:  GravelBags.it This test is not yet ap proved or cleared by the [place] FDA and  has been authorized for detection and/or diagnosis of SARS-CoV-2 by FDA under an Emergency Use Authorization (EUA). This EUA will remain  in effect (meaning this test can be used) for the duration of the COVID-19 declaration under Section 564(b)(1) of the Act, 21 U.S.C. section 360bbb-3(b)(1), unless the authorization is terminated or revoked sooner.    Influenza A by PCR NEGATIVE NEGATIVE Final   Influenza B by PCR NEGATIVE NEGATIVE Final    Comment: (NOTE) The Xpert Xpress SARS-CoV-2/FLU/RSV assay is intended as an aid in  the diagnosis of influenza from Nasopharyngeal swab specimens and  should not be used as a sole basis for treatment. Nasal washings and  aspirates are unacceptable for Xpert Xpress SARS-CoV-2/FLU/RSV   testing. Fact Sheet for Patients: PinkCheek.be Fact Sheet for Healthcare Providers: GravelBags.it This test is not yet approved or cleared by the [place] FDA and  has been authorized for detection and/or diagnosis of SARS-CoV-2 by  FDA under an Emergency Use Authorization (EUA). This EUA will remain  in effect (meaning  this test can be used) for the duration of the  Covid-19 declaration under Section 564(b)(1) of the Act, 21  U.S.C. section 360bbb-3(b)(1), unless the authorization is  terminated or revoked. Performed at [facility], [address] [place], [place] [address]     Coagulation Studies: No results for input(s): LABPROT, INR in the last 72 hours.  Urinalysis: No results for input(s): COLORURINE, LABSPEC, PHURINE, GLUCOSEU, HGBUR, BILIRUBINUR, KETONESUR, PROTEINUR, UROBILINOGEN, NITRITE, LEUKOCYTESUR in the last 72 hours.  Invalid input(s): APPERANCEUR    Imaging: No results found.   Medications:   . sodium chloride    . furosemide (LASIX) infusion 8 mg/hr (07/26/19 1228)   . apixaban  2.5 mg Oral BID  . vitamin C  1,000 mg Oral Daily  . cholecalciferol  2,000 Units Oral Daily  . hydrALAZINE  100 mg Oral TID  . insulin aspart  0-20 Units Subcutaneous TID WC  . insulin aspart  6 Units Subcutaneous TID WC  . [START ON 07/28/2019] isosorbide mononitrate  90 mg Oral Daily  . linagliptin  5 mg Oral Daily  . multivitamin with minerals  1 tablet Oral Daily  . neomycin-polymyxin-dexameth  1 application Left Eye BID  . omega-3 acid ethyl esters  1 g Oral Daily  . polyethylene glycol  17 g Oral Daily  . potassium chloride  40 mEq Oral Daily  . rosuvastatin  5 mg Oral Once per day on Mon Wed Fri  . silver sulfADIAZINE  1 application Topical BID  . sodium chloride flush  3 mL Intravenous Q12H  . spironolactone  25 mg Oral Daily   sodium chloride, acetaminophen, levalbuterol, nitroGLYCERIN,  ondansetron (ZOFRAN) IV, sodium chloride flush  Assessment/ Plan:  Kyle Roberson is a 79 y.o. white  male with atrial fibrillation, congestive heart failure, hypertension, coronary artery disease, diabetes mellitus type II who is admitted to [facility] on 07/19/2019 for Acute on chronic diastolic CHF (congestive heart failure) ([place]) [I50.33]  1. Acute Renal Failure: on chronic kidney disease stage IV. Baseline creatinine of 2.29, GFR of 26 on 06/15/19.  Chronic kidney disease with proteinuria and positive ANA. 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  Acute kidney injury secondary to acute cardio-renal syndrome.  Lab Results  Component Value Date   CREATININE 2.53 (H) 07/27/2019   CREATININE 2.53 (H) 07/26/2019   CREATININE 2.78 (H) 07/25/2019   05/05 0701 - 05/06 0700 In: 600 [P.O.:600] Out: 1100 [Urine:1100]   2. Acute exacerbation of chronic diastolic congestive heart failure Pulmonary HTN Severe lower extremity edema and lower abdominal edema echocardiogram on 05/08/19 with preserved systolic function.  2D echo from February 15 shows LVEF 55 to 60%, normal LV function, mild LVH, indeterminate left ventricular diastolic parameters Moderately elevated pulmonary artery systolic pressure of 56 mm, moderately dilated pulmonary artery   3. Hypertension:  BP Readings from Last 3 Encounters:  07/27/19 133/62  07/19/19 126/60  07/19/19 134/70  Avoid hypotension   4. Diabetes mellitus type II with chronic kidney disease: insulin dependent.  Lab Results  Component Value Date   HGBA1C 7.2 (H) 07/19/2019  Avoid ACE inhibitor or ARB while undergoing aggressive diuresis  Plan - Continue furosemide gtt  - Continue spironolactone - started this admission - Fluid restriction, salt restriction and daily standing weights.  Weight is down to 113 kg this morning ?  Goal weight 109 kg.  Possible discharge on Friday? With oral torsemide - suggest 40 mg daily with  Metolazone 5 mg once a week -  May need evaluation for sleep apnea as outpatient      LOS: 8 Eniola Cerullo 5/6/202112:38 PM

## 2019-07-27 NOTE — TOC Initial Note (Signed)
Transition of Care [facility]) - Initial/Assessment Note    Patient Details  Name: Kyle Roberson MRN: [id number] Date of Birth: 1940/09/01  Transition of Care [facility]) CM/SW Contact:    Shelbie Ammons, RN Phone Number: 07/27/2019, 9:47 AM  Clinical Narrative:       RNCM assessed patient at bedside, sitting up eating breakfast. Introduced role and discussed if it would be ok to ask a couple of questions. Patient reports that he doesn't have any needs. He reports that he had people come into his house for his mother in law and he won't do that again. Patient does not offer any further explanation just reports he feels he is doing fine, he has all necessary equipment in the home but reports that he doesn't feel he needs to use it. RNCM will remain available for any further needs.           Expected Discharge Plan: Home/Self Care Barriers to Discharge: Continued Medical Work up   Patient Goals and CMS Choice Patient states their goals for this hospitalization and ongoing recovery are:: to get out of here      Expected Discharge Plan and Services Expected Discharge Plan: Home/Self Care   Discharge Planning Services: CM Consult Post Acute Care Choice: NA Living arrangements for the past 2 months: Single Family Home                                      Prior Living Arrangements/Services Living arrangements for the past 2 months: Single Family Home Lives with:: Spouse Patient language and need for interpreter reviewed:: Yes Do you feel safe going back to the place where you live?: Yes      Need for Family Participation in Patient Care: No (Comment) Care giver support system in place?: No (comment)   Criminal Activity/Legal Involvement Pertinent to Current Situation/Hospitalization: No - Comment as needed  Activities of Daily Living Home Assistive Devices/Equipment: Environmental consultant (specify type), Wheelchair, Shower chair with back, Bedside commode/3-in-1, Grab bars around toilet, Grab  bars in shower, Raised toilet seat with rails, Eyeglasses ADL Screening (condition at time of admission) Patient's cognitive ability adequate to safely complete daily activities?: Yes Is the patient deaf or have difficulty hearing?: No Does the patient have difficulty seeing, even when wearing glasses/contacts?: Yes(left eye limited vision) Does the patient have difficulty concentrating, remembering, or making decisions?: No Patient able to express need for assistance with ADLs?: Yes Does the patient have difficulty dressing or bathing?: No Independently performs ADLs?: Yes (appropriate for developmental age) Does the patient have difficulty walking or climbing stairs?: Yes(due to shortness of breath) Weakness of Legs: None Weakness of Arms/Hands: None  Permission Sought/Granted                  Emotional Assessment Appearance:: Appears stated age Attitude/Demeanor/Rapport: Complaining Affect (typically observed): Blunt Orientation: : Oriented to Self, Oriented to Place, Oriented to  Time, Oriented to Situation Alcohol / Substance Use: Not Applicable Psych Involvement: No (comment)  Admission diagnosis:  Acute on chronic diastolic CHF (congestive heart failure) ([place]) [I50.33] Patient Active Problem List   Diagnosis Date Noted  . Anasarca   . Chronic a-fib ([place])   . Hyperlipidemia   . Acute on chronic diastolic CHF (congestive heart failure) ([place]) 07/19/2019  . Cellulitis of lower extremity 05/31/2019  . Acute pulmonary edema (HCC)   . Abdominal swelling   . SOB (shortness of breath)  05/07/2019  . Suspect Acute heart failure ([place]) 05/07/2019  . 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 CKD (chronic kidney disease) stage 4, GFR 15-29 ml/min (HCC) 04/27/2019  . Acute on chronic heart failure with preserved ejection fraction (HFpEF) ([place]) 04/27/2019  . Morbid obesity ([place]) 04/27/2019  . Lower extremity edema 03/23/2019  . Erectile dysfunction 08/01/2018  .  History of colonic diverticulitis 08/01/2018  . 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 Obesity (BMI 30-39.9) 08/01/2018  . Venous insufficiency 08/01/2018  . Persistent atrial fibrillation ([place]) 08/01/2018  . 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 Essential hypertension 07/06/2018  . Type 2 diabetes mellitus with stage 4 chronic kidney disease ([place]) 07/06/2018  . Coronary artery disease involving native coronary artery of native heart without angina pectoris 06/05/2018  . 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[place]) 01/30/2017  . Vitamin D deficiency 01/30/2017  . Benign non-nodular prostatic hyperplasia with lower urinary tract symptoms 09/29/2014  . Chronic kidney disease, stage 3a 06/27/2014  . History of MI (myocardial infarction) 06/27/2014  . Nephrolithiasis 01/04/2012   PCP:  Glean Hess, MD Pharmacy:   [facility], [place] [address] [place] [address] Phone: [phone] Fax: [phone]     Social Determinants of Health (SDOH) Interventions    Readmission Risk Interventions Readmission Risk Prevention Plan 07/09/2018  Transportation Screening Complete  PCP or Specialist Appt within 5-7 Days Complete  Home Care Screening Complete  Medication Review (RN CM) Complete

## 2019-07-28 ENCOUNTER — Telehealth: Payer: Self-pay | Admitting: Internal Medicine

## 2019-07-28 LAB — RENAL FUNCTION PANEL
Albumin: 3.8 g/dL (ref 3.5–5.0)
Anion gap: 14 (ref 5–15)
BUN: 73 mg/dL — ABNORMAL HIGH (ref 8–23)
CO2: 33 mmol/L — ABNORMAL HIGH (ref 22–32)
Calcium: 9.5 mg/dL (ref 8.9–10.3)
Chloride: 92 mmol/L — ABNORMAL LOW (ref 98–111)
Creatinine, Ser: 2.78 mg/dL — ABNORMAL HIGH (ref 0.61–1.24)
GFR calc Af Amer: 24 mL/min — ABNORMAL LOW (ref >60)
GFR calc non Af Amer: 21 mL/min — ABNORMAL LOW (ref >60)
Glucose, Bld: 168 mg/dL — ABNORMAL HIGH (ref 70–99)
Phosphorus: 4.1 mg/dL (ref 2.5–4.6)
Potassium: 3.7 mmol/L (ref 3.5–5.1)
Sodium: 139 mmol/L (ref 135–145)

## 2019-07-28 LAB — GLUCOSE, CAPILLARY
Glucose-Capillary: 144 mg/dL — ABNORMAL HIGH (ref 70–99)
Glucose-Capillary: 141 mg/dL — ABNORMAL HIGH (ref 70–99)
Glucose-Capillary: 203 mg/dL — ABNORMAL HIGH (ref 70–99)
Glucose-Capillary: 183 mg/dL — ABNORMAL HIGH (ref 70–99)

## 2019-07-28 LAB — CBC
HCT: 34.5 % — ABNORMAL LOW (ref 39.0–52.0)
Hemoglobin: 10.6 g/dL — ABNORMAL LOW (ref 13.0–17.0)
MCH: 26.5 pg (ref 26.0–34.0)
MCHC: 30.7 g/dL (ref 30.0–36.0)
MCV: 86.3 fL (ref 80.0–100.0)
Platelets: 207 10*3/uL (ref 150–400)
RBC: 4 MIL/uL — ABNORMAL LOW (ref 4.22–5.81)
RDW: 15.9 % — ABNORMAL HIGH (ref 11.5–15.5)
WBC: 8.2 10*3/uL (ref 4.0–10.5)
nRBC: 0 % (ref 0.0–0.2)

## 2019-07-28 MED ORDER — LINAGLIPTIN 5 MG PO TABS: 5.0000 mg | ORAL_TABLET | Freq: Every day | ORAL | 1 refills | Status: DC

## 2019-07-28 MED ORDER — SPIRONOLACTONE 25 MG PO TABS: 25.0000 mg | ORAL_TABLET | Freq: Every day | ORAL | 0 refills | Status: DC

## 2019-07-28 MED ORDER — HYDRALAZINE HCL 100 MG PO TABS: 100.0000 mg | ORAL_TABLET | Freq: Three times a day (TID) | ORAL | 1 refills | Status: DC

## 2019-07-28 MED ORDER — ISOSORBIDE MONONITRATE ER 30 MG PO TB24: 90.0000 mg | ORAL_TABLET | Freq: Every day | ORAL | 1 refills | Status: DC

## 2019-07-28 NOTE — Progress Notes (Signed)
Discharge instructions given to patient. IV removed, pt is waiting for his ride.

## 2019-07-28 NOTE — Discharge Summary (Signed)
Physician Discharge Summary  Kia Stavros WLN:[id number] DOB: 1941/01/13 DOA: 07/19/2019  PCP: Glean Hess, MD  Admit date: 07/19/2019 Discharge date: 07/28/2019  Admitted From: Home Disposition: Home  Recommendations for Outpatient Follow-up:  1. Follow up with PCP in 1-2 weeks 2. Follow-up with cardiology and heart failure clinic. 3. Follow-up with nephrology 4. Please obtain BMP/CBC in one week 5. Please follow up on the following pending results:None  Home Health:No Equipment/Devices: None Discharge Condition: Fair CODE STATUS: Full Diet recommendation: Heart Healthy / Carb Modified   Brief/Interim Summary: Ave Filter a 79 y.o.malewith medical history significant fordiabetes mellitus with complications of stage IV chronic kidney disease, GERD, persistent atrial fibrillation, coronary artery disease, hypertension and hyperparathyroidism who was sent to the hospital for admission by the cardiologist due to refractory fluid overload.  Admitted for acute on chronic heart failure and started on Lasix infusion for a gentle diuresis with worsening renal function.  Patient diuresed very well with net of more than 14 L.  Weight down to 250 pound from 275 on admission.  Baseline between 245-250.  Lasix infusion discontinued with increase in his creatinine and he will resume his home dose of torsemide on discharge.  Cardiology was following and recommending right heart cath which he declined.  He will follow-up with heart failure clinic and cardiology as an outpatient for further management.  He is a candidate for ICD but he declined at this time.  Cardiology can discussed more as an outpatient.  Continue to have some lower extremity edema and there might be a some component of lymphedema.  He was asked to wrap his legs.  For better blood pressure control his hydralazine dose was increased, Imdur dose was increased and amlodipine was discontinued as it can contribute to lower  extremity edema.  His diabetes with A1c of 7.2.  Home dose of glipizide was discontinued and he was started on Tradjenta along with home dose of insulin.  He will continue rest of his home meds.  Discharge Diagnoses:  Principal Problem:   Acute on chronic diastolic CHF (congestive heart failure) (HCC) Active Problems:   Essential hypertension   Type 2 diabetes mellitus with stage 4 chronic kidney disease (HCC)   Coronary artery disease involving native coronary artery of native heart without angina pectoris   Persistent atrial fibrillation (HCC)   Morbid obesity (HCC)   Abdominal swelling   Anasarca   Chronic a-fib (HCC)   Hyperlipidemia  Discharge Instructions  Discharge Instructions    AMB referral to CHF clinic   Complete by: As directed    AMB referral to pulmonary rehabilitation   Complete by: As directed    Please select a program: Respiratory Care Services   Respiratory Care Services Diagnosis: Heart Failure   After initial evaluation and assessments completed: Virtual Based Care may be provided alone or in conjunction with Pulmonary Rehab/Respiratory Care services based on patient barriers.: Yes   Diet - low sodium heart healthy   Complete by: As directed    Discharge instructions   Complete by: As directed    It was pleasure taking care of you. We stopped your amlodipine as it can also contribute with your leg swelling. Your Imdur dose was increased for better control of blood pressure along with your angina symptoms. You will resume your home dose of torsemide. You will need a close follow-up with your cardiologist and nephrologist.   Increase activity slowly   Complete by: As directed      Allergies as  of 07/28/2019      Reactions   Atorvastatin Other (See Comments)   Simvastatin Other (See Comments)      Medication List    STOP taking these medications   amLODipine 10 MG tablet Commonly known as: NORVASC   glipiZIDE 10 MG 24 hr tablet Commonly known  as: GLUCOTROL XL     TAKE these medications   apixaban 2.5 MG Tabs tablet Commonly known as: ELIQUIS Take 1 tablet (2.5 mg total) by mouth 2 (two) times daily.   cholecalciferol 25 MCG (1000 UNIT) tablet Commonly known as: VITAMIN D3 Take 2,000 Units by mouth daily.   Flax Seed Oil 1000 MG Caps Take by mouth.   hydrALAZINE 100 MG tablet Commonly known as: APRESOLINE Take 1 tablet (100 mg total) by mouth 3 (three) times daily. What changed:   medication strength  how much to take   insulin NPH Human 100 UNIT/ML injection Commonly known as: NOVOLIN N Inject 10-15 Units into the skin daily. Take 10-15 units twice a day after breakfast and before bedtime per Sliding Scale (BS>250: 15 units, BS= 200-250:10 units)   isosorbide mononitrate 30 MG 24 hr tablet Commonly known as: IMDUR Take 3 tablets (90 mg total) by mouth daily. Start taking on: Jul 29, 2019   levalbuterol 45 MCG/ACT inhaler Commonly known as: Xopenex HFA Inhale 2 puffs into the lungs every 6 (six) hours as needed for wheezing or shortness of breath.   linagliptin 5 MG Tabs tablet Commonly known as: TRADJENTA Take 1 tablet (5 mg total) by mouth daily. Start taking on: Jul 29, 2019   Maxitrol 0.1 % Oint Generic drug: neomycin-polymyxin-dexameth Place 1 application into the left eye 2 (two) times daily.   multivitamin with minerals tablet Take 1 tablet by mouth daily.   nitroGLYCERIN 0.4 MG/SPRAY spray Commonly known as: NITROLINGUAL Place 1 spray under the tongue as directed.   Omega 3 1000 MG Caps Take 1 capsule by mouth daily.   rosuvastatin 5 MG tablet Commonly known as: Crestor Take 1 tablet (5 mg total) by mouth 3 (three) times a week.   silver sulfADIAZINE 1 % cream Commonly known as: SILVADENE Apply 1 application topically 2 (two) times daily. To leg wounds   spironolactone 25 MG tablet Commonly known as: ALDACTONE Take 1 tablet (25 mg total) by mouth daily. Start taking on: Jul 29, 2019    torsemide 20 MG tablet Commonly known as: DEMADEX Take 60 mg by mouth 2 (two) times daily.   vitamin C 1000 MG tablet Take 1,000 mg by mouth daily.      Follow-up Information    [facility] Follow up on 08/04/2019.   Specialty: Cardiology Why: at 2:00pm. Enter through the [facility] entrance Contact information: [place] [phone]       Glean Hess, MD. Schedule an appointment as soon as possible for a visit.   Specialty: Internal Medicine Contact information: [address] [place] [address] [phone]        Nelva Bush, MD .   Specialty: Cardiology Contact information: [place] [address] [phone]        Murlean Iba, MD. Schedule an appointment as soon as possible for a visit.   Specialty: Nephrology Contact information: [place] [address] [phone]          Allergies  Allergen Reactions  .  Atorvastatin Other (See Comments)  . Simvastatin Other (See Comments)    Consultations:  Cardiology  Nephrology  Procedures/Studies: [place] ASCITES (ABDOMEN LIMITED)  Result Date: 07/21/2019 CLINICAL DATA:  Abdominal distension, possible ascites EXAM: LIMITED ABDOMEN ULTRASOUND FOR ASCITES TECHNIQUE: Limited ultrasound survey for ascites was performed in all four abdominal quadrants. COMPARISON:  None. FINDINGS: Minimal ascites is noted. No sizable pocket to allow for safe paracentesis is noted. Small left pleural effusion is noted. IMPRESSION: Minimal ascites. Small left pleural effusion. Electronically Signed   By: Inez Catalina M.D.   On: 07/21/2019 15:07    Subjective: Patient has no new complaints today.  He was eager to go home.  He was able to walk around in the room without any significant dyspnea.  Discharge Exam: Vitals:   07/28/19 0400 07/28/19  0758  BP: (!) 157/71 (!) 130/95  Pulse: 63 (!) 58  Resp:  20  Temp: 97.7 F (36.5 C) 98.2 F (36.8 C)  SpO2:  96%   Vitals:   07/27/19 1643 07/27/19 1950 07/28/19 0400 07/28/19 0758  BP: (!) 143/52 (!) 131/54 (!) 157/71 (!) 130/95  Pulse: (!) 48 60 63 (!) 58  Resp: 16 19  20   Temp:  (!) 97.4 F (36.3 C) 97.7 F (36.5 C) 98.2 F (36.8 C)  TempSrc:  Oral Oral   SpO2: 93% 99%  96%  Weight:        General: Pt is alert, awake, not in acute distress Cardiovascular: RRR, S1/S2 +, no rubs, no gallops Respiratory: CTA bilaterally, no wheezing, no rhonchi Abdominal: Soft, NT, ND, bowel sounds + Extremities: 2+ LE edema, no cyanosis   The results of significant diagnostics from this hospitalization (including imaging, microbiology, ancillary and laboratory) are listed below for reference.    Microbiology: No results found for this or any previous visit (from the past 240 hour(s)).   Labs: BNP (last 3 results) Recent Labs    04/26/19 1629 05/07/19 1731 07/14/19 1134  BNP 169.0* 165.0* 527.7*   Basic Metabolic Panel: Recent Labs  Lab 07/22/19 0514 07/23/19 0553 07/24/19 0634 07/25/19 0549 07/26/19 0604 07/27/19 0447 07/28/19 0420  NA 140   < > 140 138 135 136 139  K 3.7   < > 3.5 3.3* 3.1* 3.0* 3.7  CL 98   < > 95* 93* 91* 91* 92*  CO2 28   < > 33* 33* 34* 33* 33*  GLUCOSE 115*   < > 141* 136* 177* 161* 168*  BUN 55*   < > 69* 68* 66* 68* 73*  CREATININE 2.48*   < > 2.70* 2.78* 2.53* 2.53* 2.78*  CALCIUM 8.6*   < > 8.9 9.0 9.0 9.2 9.5  MG 2.3  --   --   --   --   --   --   PHOS  --   --   --   --   --  4.1 4.1   < > = values in this interval not displayed.   Liver Function Tests: Recent Labs  Lab 07/27/19 0447 07/28/19 0420  ALBUMIN 3.6 3.8   No results for input(s): LIPASE, AMYLASE in the last 168 hours. No results for input(s): AMMONIA in the last 168 hours. CBC: Recent Labs  Lab 07/22/19 0514 07/25/19 0549 07/28/19 0420  WBC 7.2 7.6 8.2  HGB  10.4* 10.8* 10.6*  HCT 34.8* 35.4* 34.5*  MCV 88.3 86.8 86.3  PLT 191 186 207   Cardiac Enzymes: No results for input(s): CKTOTAL, CKMB,  CKMBINDEX, TROPONINI in the last 168 hours. BNP: Invalid input(s): POCBNP CBG: Recent Labs  Lab 07/27/19 1135 07/27/19 1644 07/27/19 1954 07/27/19 2143 07/28/19 0747  GLUCAP 241* 144* 141* 203* 183*   D-Dimer No results for input(s): DDIMER in the last 72 hours. Hgb A1c No results for input(s): HGBA1C in the last 72 hours. Lipid Profile No results for input(s): CHOL, HDL, LDLCALC, TRIG, CHOLHDL, LDLDIRECT in the last 72 hours. Thyroid function studies No results for input(s): TSH, T4TOTAL, T3FREE, THYROIDAB in the last 72 hours.  Invalid input(s): FREET3 Anemia work up No results for input(s): VITAMINB12, FOLATE, FERRITIN, TIBC, IRON, RETICCTPCT in the last 72 hours. Urinalysis    Component Value Date/Time   COLORURINE YELLOW (A) 01/02/2019 2131   APPEARANCEUR CLOUDY (A) 01/02/2019 2131   LABSPEC 1.013 01/02/2019 2131   PHURINE 5.0 01/02/2019 2131   GLUCOSEU >=500 (A) 01/02/2019 2131   HGBUR MODERATE (A) 01/02/2019 2131   BILIRUBINUR neg 01/10/2019 [place] 01/02/2019 2131   PROTEINUR Positive (A) 01/10/2019 1628   PROTEINUR 30 (A) 01/02/2019 2131   UROBILINOGEN 0.2 01/10/2019 1628   NITRITE neg 01/10/2019 1628   NITRITE NEGATIVE 01/02/2019 2131   LEUKOCYTESUR Negative 01/10/2019 1628   LEUKOCYTESUR NEGATIVE 01/02/2019 2131   Sepsis Labs Invalid input(s): PROCALCITONIN,  WBC,  LACTICIDVEN Microbiology No results found for this or any previous visit (from the past 240 hour(s)).  Time coordinating discharge: Over 30 minutes  SIGNED:  Lorella Nimrod, MD  [facility] 07/28/2019, 11:16 AM  If 7PM-7AM, please contact night-coverage [email or web address]  This record has been created using Systems analyst. Errors have been sought and corrected,but may not always be located. Such creation errors  do not reflect on the standard of care.

## 2019-07-28 NOTE — Progress Notes (Signed)
Brief Pharmacy note:  Stopped by patient room to review discharge med changes.  Pt not interested in discussing at this time.  Left phone number with patient.  Lu Duffel, PharmD, BCPS Clinical Pharmacist 07/28/2019 2:29 PM

## 2019-07-28 NOTE — Progress Notes (Signed)
Took over care of patient at 0400.

## 2019-07-28 NOTE — Telephone Encounter (Signed)
Patient family member calling to request PA to be started for that linagliptin (TRADJENTA) 5 MG medication is needing a prior authorization . PA is started by cardiology. Advised family member that cardiology is working on the [place]. Family member asked if Dr. Army Melia can start the PA for Tradjenta

## 2019-07-28 NOTE — Progress Notes (Signed)
PT Cancellation Note  Patient Details Name: Kyle Roberson MRN: [id number] DOB: October 01, 1940   Cancelled Treatment:    Reason Eval/Treat Not Completed: Other (comment)(Pt up in chair upon PT entering room. Reported no questions or concerns about mobility, equipment, or discharge home, stated he has been up and walking today and during his hospital stay, politely declined PT this AM, PT to follow up as able.)  Lieutenant Diego PT, DPT 10:16 AM,07/28/19

## 2019-07-28 NOTE — Progress Notes (Signed)
[facility]  ROUNDING NOTE   Subjective:   Sitting up on the side of the bed in a recliner chair Reports that urine output is good  Appetite is fair Continues to have large amount of edema over lower abdomen and both legs are improving slowly Standing weight not available today   Objective:  Vital signs in last 24 hours:  Temp:  [97.4 F (36.3 C)-98.2 F (36.8 C)] 98.2 F (36.8 C) (05/07 0758) Pulse Rate:  [48-63] 58 (05/07 0758) Resp:  [16-20] 20 (05/07 0758) BP: (130-157)/(52-95) 130/95 (05/07 0758) SpO2:  [93 %-99 %] 96 % (05/07 0758)  Weight change:  Filed Weights   07/25/19 0500 07/26/19 0324 07/27/19 0431  Weight: 116.9 kg 115 kg 113.8 kg    Intake/Output: I/O last 3 completed shifts: In: 720 [P.O.:720] Out: 2100 [Urine:2100]   Intake/Output this shift:  Total I/O In: -  Out: 1800 [Urine:1800]  Physical Exam: General:  Sitting up , no acute distress  Head:  Normocephalic  Eyes:  Anicteric  Neck:  Supple,   Lungs:   Normal effort, decreased breath sounds at bases, room air  Heart:  No rub  Abdomen:   Soft, distended  Extremities:  2-3+ pitting edema over legs up to thighs  Neurologic:  Alert, oriented  Skin:  Congestive hyperemia over the legs       Basic Metabolic Panel: Recent Labs  Lab 07/22/19 0514 07/23/19 0553 07/24/19 0634 07/24/19 0634 07/25/19 0549 07/25/19 0549 07/26/19 0604 07/27/19 0447 07/28/19 0420  NA 140   < > 140  --  138  --  135 136 139  K 3.7   < > 3.5  --  3.3*  --  3.1* 3.0* 3.7  CL 98   < > 95*  --  93*  --  91* 91* 92*  CO2 28   < > 33*  --  33*  --  34* 33* 33*  GLUCOSE 115*   < > 141*  --  136*  --  177* 161* 168*  BUN 55*   < > 69*  --  68*  --  66* 68* 73*  CREATININE 2.48*   < > 2.70*  --  2.78*  --  2.53* 2.53* 2.78*  CALCIUM 8.6*   < > 8.9   < > 9.0   < > 9.0 9.2 9.5  MG 2.3  --   --   --   --   --   --   --   --   PHOS  --   --   --   --   --   --   --  4.1 4.1   < > = values in this interval  not displayed.    Liver Function Tests: Recent Labs  Lab 07/27/19 0447 07/28/19 0420  ALBUMIN 3.6 3.8   No results for input(s): LIPASE, AMYLASE in the last 168 hours. No results for input(s): AMMONIA in the last 168 hours.  CBC: Recent Labs  Lab 07/22/19 0514 07/25/19 0549 07/28/19 0420  WBC 7.2 7.6 8.2  HGB 10.4* 10.8* 10.6*  HCT 34.8* 35.4* 34.5*  MCV 88.3 86.8 86.3  PLT 191 186 207    Cardiac Enzymes: No results for input(s): CKTOTAL, CKMB, CKMBINDEX, TROPONINI in the last 168 hours.  BNP: Invalid input(s): POCBNP  CBG: Recent Labs  Lab 07/27/19 1135 07/27/19 1644 07/27/19 1954 07/27/19 2143 07/28/19 0747  GLUCAP 241* 144* 141* 203* 183*    Microbiology: Results  for orders placed or performed during the hospital encounter of 05/07/19  Blood Culture (routine x 2)     Status: None   Collection Time: 05/07/19  6:45 PM   Specimen: BLOOD  Result Value Ref Range Status   Specimen Description BLOOD R AC  Final   Special Requests   Final    BOTTLES DRAWN AEROBIC AND ANAEROBIC Blood Culture results may not be optimal due to an excessive volume of blood received in culture bottles   Culture   Final    NO GROWTH 5 DAYS Performed at [facility], [address], [place], [place] [address]    Report Status 05/12/2019 FINAL  Final  Blood Culture (routine x 2)     Status: None   Collection Time: 05/07/19  6:45 PM   Specimen: BLOOD  Result Value Ref Range Status   Specimen Description BLOOD R HAND  Final   Special Requests   Final    BOTTLES DRAWN AEROBIC AND ANAEROBIC Blood Culture results may not be optimal due to an inadequate volume of blood received in culture bottles   Culture   Final    NO GROWTH 5 DAYS Performed at [facility], [address] [place], [place] [address]    Report Status 05/12/2019 FINAL  Final  Respiratory Panel by RT PCR (Flu A&B, Covid) - Nasopharyngeal Swab     Status: None   Collection Time: 05/07/19 11:58 PM    Specimen: Nasopharyngeal Swab  Result Value Ref Range Status   SARS Coronavirus 2 by RT PCR NEGATIVE NEGATIVE Final    Comment: (NOTE) SARS-CoV-2 target nucleic acids are NOT DETECTED. The SARS-CoV-2 RNA is generally detectable in upper respiratoy specimens during the acute phase of infection. The lowest concentration of SARS-CoV-2 viral copies this assay can detect is 131 copies/mL. A negative result does not preclude SARS-Cov-2 infection and should not be used as the sole basis for treatment or other patient management decisions. A negative result may occur with  improper specimen collection/handling, submission of specimen other than nasopharyngeal swab, presence of viral mutation(s) within the areas targeted by this assay, and inadequate number of viral copies (<131 copies/mL). A negative result must be combined with clinical observations, patient history, and epidemiological information. The expected result is Negative. Fact Sheet for Patients:  PinkCheek.be Fact Sheet for Healthcare Providers:  GravelBags.it This test is not yet ap proved or cleared by the [place] FDA and  has been authorized for detection and/or diagnosis of SARS-CoV-2 by FDA under an Emergency Use Authorization (EUA). This EUA will remain  in effect (meaning this test can be used) for the duration of the COVID-19 declaration under Section 564(b)(1) of the Act, 21 U.S.C. section 360bbb-3(b)(1), unless the authorization is terminated or revoked sooner.    Influenza A by PCR NEGATIVE NEGATIVE Final   Influenza B by PCR NEGATIVE NEGATIVE Final    Comment: (NOTE) The Xpert Xpress SARS-CoV-2/FLU/RSV assay is intended as an aid in  the diagnosis of influenza from Nasopharyngeal swab specimens and  should not be used as a sole basis for treatment. Nasal washings and  aspirates are unacceptable for Xpert Xpress SARS-CoV-2/FLU/RSV  testing. Fact  Sheet for Patients: PinkCheek.be Fact Sheet for Healthcare Providers: GravelBags.it This test is not yet approved or cleared by the [place] FDA and  has been authorized for detection and/or diagnosis of SARS-CoV-2 by  FDA under an Emergency Use Authorization (EUA). This EUA will remain  in effect (meaning this test  can be used) for the duration of the  Covid-19 declaration under Section 564(b)(1) of the Act, 21  U.S.C. section 360bbb-3(b)(1), unless the authorization is  terminated or revoked. Performed at [facility], [address] [place], [place] [address]     Coagulation Studies: No results for input(s): LABPROT, INR in the last 72 hours.  Urinalysis: No results for input(s): COLORURINE, LABSPEC, PHURINE, GLUCOSEU, HGBUR, BILIRUBINUR, KETONESUR, PROTEINUR, UROBILINOGEN, NITRITE, LEUKOCYTESUR in the last 72 hours.  Invalid input(s): APPERANCEUR    Imaging: No results found.   Medications:   . sodium chloride     . apixaban  2.5 mg Oral BID  . vitamin C  1,000 mg Oral Daily  . cholecalciferol  2,000 Units Oral Daily  . hydrALAZINE  100 mg Oral TID  . insulin aspart  0-20 Units Subcutaneous TID WC  . insulin aspart  6 Units Subcutaneous TID WC  . isosorbide mononitrate  90 mg Oral Daily  . linagliptin  5 mg Oral Daily  . multivitamin with minerals  1 tablet Oral Daily  . neomycin-polymyxin-dexameth  1 application Left Eye BID  . omega-3 acid ethyl esters  1 g Oral Daily  . polyethylene glycol  17 g Oral Daily  . potassium chloride  40 mEq Oral Daily  . rosuvastatin  5 mg Oral Once per day on Mon Wed Fri  . silver sulfADIAZINE  1 application Topical BID  . sodium chloride flush  3 mL Intravenous Q12H  . spironolactone  25 mg Oral Daily   sodium chloride, acetaminophen, diphenhydrAMINE, levalbuterol, nitroGLYCERIN, ondansetron (ZOFRAN) IV, sodium chloride flush  Assessment/ Plan:  Mr.  Asheton Scheffler is a 79 y.o. white  male with atrial fibrillation, congestive heart failure, hypertension, coronary artery disease, diabetes mellitus type II who is admitted to [facility] on 07/19/2019 for Acute on chronic diastolic CHF (congestive heart failure) ([place]) [I50.33]  1. Acute Renal Failure: on chronic kidney disease stage IV. Baseline creatinine of 2.29, GFR of 26 on 06/15/19.  Chronic kidney disease with proteinuria and positive ANA. 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  Acute kidney injury secondary to acute cardio-renal syndrome.  Lab Results  Component Value Date   CREATININE 2.78 (H) 07/28/2019   CREATININE 2.53 (H) 07/27/2019   CREATININE 2.53 (H) 07/26/2019   05/06 0701 - 05/07 0700 In: 600 [P.O.:600] Out: 1000 [Urine:1000]   2. Acute exacerbation of chronic diastolic congestive heart failure Pulmonary HTN Severe lower extremity edema and lower abdominal edema echocardiogram on 05/08/19 with preserved systolic function.  2D echo from February 15 shows LVEF 55 to 60%, normal LV function, mild LVH, indeterminate left ventricular diastolic parameters Moderately elevated pulmonary artery systolic pressure of 56 mm, moderately dilated pulmonary artery   3. Hypertension:  BP Readings from Last 3 Encounters:  07/28/19 (!) 130/95  07/19/19 126/60  07/19/19 134/70  Avoid hypotension   4. Diabetes mellitus type II with chronic kidney disease: insulin dependent.  Lab Results  Component Value Date   HGBA1C 7.2 (H) 07/19/2019  Avoid ACE inhibitor or ARB while undergoing aggressive diuresis  Plan - Continue torsemide orally.  Consider 40 to 60 mg once a day - Continue spironolactone - started this admission - May need evaluation for sleep apnea as outpatient We will set up office follow-up in the next 2 to 3 weeks      LOS: 9 Lateisha Thurlow 5/7/20212:55 PM

## 2019-07-28 NOTE — Telephone Encounter (Signed)
Patient family member calling States that linagliptin (TRADJENTA) 5 MG medication is needing a prior authorization  Prescription will need to be sent to [facility] in [place] for 90 day  If patient is unable to get medication before the end of the day would like to know what to do in the meantime Please call to discuss

## 2019-07-28 NOTE — Progress Notes (Signed)
Progress Note  Patient Name: Kyle Roberson Date of Encounter: 07/28/2019  Primary Cardiologist: Nelva Bush, MD  Subjective   Significant urine output past 24 hours approximately 2 L Abdomen feels soft, no shortness of breath, reports his thighs much softer Ace wraps in place  Inpatient Medications    Scheduled Meds: . apixaban  2.5 mg Oral BID  . vitamin C  1,000 mg Oral Daily  . cholecalciferol  2,000 Units Oral Daily  . hydrALAZINE  100 mg Oral TID  . insulin aspart  0-20 Units Subcutaneous TID WC  . insulin aspart  6 Units Subcutaneous TID WC  . isosorbide mononitrate  90 mg Oral Daily  . linagliptin  5 mg Oral Daily  . multivitamin with minerals  1 tablet Oral Daily  . neomycin-polymyxin-dexameth  1 application Left Eye BID  . omega-3 acid ethyl esters  1 g Oral Daily  . polyethylene glycol  17 g Oral Daily  . potassium chloride  40 mEq Oral Daily  . rosuvastatin  5 mg Oral Once per day on Mon Wed Fri  . silver sulfADIAZINE  1 application Topical BID  . sodium chloride flush  3 mL Intravenous Q12H  . spironolactone  25 mg Oral Daily   Continuous Infusions: . sodium chloride     PRN Meds: sodium chloride, acetaminophen, diphenhydrAMINE, levalbuterol, nitroGLYCERIN, ondansetron (ZOFRAN) IV, sodium chloride flush   Vital Signs    Vitals:   07/27/19 1643 07/27/19 1950 07/28/19 0400 07/28/19 0758  BP: (!) 143/52 (!) 131/54 (!) 157/71 (!) 130/95  Pulse: (!) 48 60 63 (!) 58  Resp: 16 19  20   Temp:  (!) 97.4 F (36.3 C) 97.7 F (36.5 C) 98.2 F (36.8 C)  TempSrc:  Oral Oral   SpO2: 93% 99%  96%  Weight:        Intake/Output Summary (Last 24 hours) at 07/28/2019 1019 Last data filed at 07/28/2019 0745 Gross per 24 hour  Intake 360 ml  Output 2500 ml  Net -2140 ml   Filed Weights   07/25/19 0500 07/26/19 0324 07/27/19 0431  Weight: 116.9 kg 115 kg 113.8 kg    Physical Exam   Constitutional:  oriented to person, place, and time. No distress.    HENT:  Head: Grossly normal Eyes:  no discharge. No scleral icterus.  Neck: No JVD, no carotid bruits  Cardiovascular: Irregularly irregular, no murmurs appreciated 2+ pitting edema below the knees, trace in the thigh area, Ace wraps in place Pulmonary/Chest: Clear to auscultation bilaterally, no wheezes or rails Abdominal: Soft.  no distension.  no tenderness.  Musculoskeletal: Normal range of motion Neurological:  normal muscle tone. Coordination normal. No atrophy Skin: Skin warm and dry Psychiatric: normal affect, pleasant   Labs    Chemistry Recent Labs  Lab 07/26/19 0604 07/27/19 0447 07/28/19 0420  NA 135 136 139  K 3.1* 3.0* 3.7  CL 91* 91* 92*  CO2 34* 33* 33*  GLUCOSE 177* 161* 168*  BUN 66* 68* 73*  CREATININE 2.53* 2.53* 2.78*  CALCIUM 9.0 9.2 9.5  ALBUMIN  --  3.6 3.8  GFRNONAA 23* 23* 21*  GFRAA 27* 27* 24*  ANIONGAP 10 12 14      Hematology Recent Labs  Lab 07/22/19 0514 07/25/19 0549 07/28/19 0420  WBC 7.2 7.6 8.2  RBC 3.94* 4.08* 4.00*  HGB 10.4* 10.8* 10.6*  HCT 34.8* 35.4* 34.5*  MCV 88.3 86.8 86.3  MCH 26.4 26.5 26.5  MCHC 29.9* 30.5 30.7  RDW  15.6* 15.9* 15.9*  PLT 191 186 207    Cardiac Enzymes  Recent Labs  Lab 07/19/19 1325 07/19/19 1510  TROPONINIHS 17 17       Radiology    No results found.  Telemetry    Afib 50s to 70s, rare PVCs - Personally Reviewed  Cardiac Studies   2D echo 04/2019: 1. Left ventricular ejection fraction, by estimation, is 55 to 60%. The  left ventricle has normal function. The left ventricle has no regional  wall motion abnormalities. There is mild left ventricular hypertrophy.  Left ventricular diastolic parameters  are indeterminate.   2. Right ventricular systolic function is normal. The right ventricular  size is normal. There is moderately elevated pulmonary artery systolic  pressure. The estimated right ventricular systolic pressure is 25.0 mmHg.   3. Left atrial size was moderately  dilated.   4. The mitral valve is normal in structure and function. Mild to moderate  mitral valve regurgitation. No evidence of mitral stenosis.   5. The aortic valve is normal in structure and function. Aortic valve  regurgitation is mild. Mild aortic valve sclerosis is present, with no  evidence of aortic valve stenosis.   6. Moderately dilated pulmonary artery.   7. The inferior vena cava is dilated in size with <50% respiratory  variability, suggesting right atrial pressure of 15 mmHg.    Patient Profile     79 y.o. male with history of persistent A. fib, heart failure preserved ejection fraction, diabetes, hypertension, HL CKD stage IV being seen due to volume overload, heart failure preserved ejection fraction.   Assessment & Plan    Acute on chronic diastolic CHF.Pulmonary hypertension Ejection fraction 55 to 60% On arrival with massive fluid retention, thigh edema, flank edema With aggressive diuresis this hospital course his renal function has stayed stable, though past 24 hours bump in BUN and creatinine  -He has been treated with Lasix infusion his hospital course with 16 to 17 L out, likely underestimated -Amlodipine held this admission as this can contribute to leg swelling, would not continue amlodipine at discharge -Would continue leg wraps at home given woody edema --Discussed procedures with him this admission he does not want right heart catheterization he does not want referral to clinics in [facility] " Not interested" We will arrange follow-up in CHF clinic in [place]  Leg edema Massive fluid overload Much improved with diuresis Leg wraps applied yesterday with mild improvement -Suspect component of lymphedema -Consider lymphedema compression pumps, continued Ace wraps, referral to OT, lymphedema specialist/massage  Atrial fibrillation,persistent Continue Eliquis Pulse in the 60s Not on any rate controlling agents  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 Hemoglobin A1c estimated 7.2 Unable to exercise, low carbohydrate diet recommended  Coronary artery disease with stable angina History of remote stenting of the LAD, angioplasty of diagonal branch, moderate RCA disease He is declining any further work-up  Chronic renal failure Slight climbing creatinine today, Would recommend we stop Lasix infusion, Restart oral torsemide 60 twice daily with close outpatient monitoring of renal function, close follow-up with nephrology   Total encounter time more than 25 minutes  Greater than 50% was spent in counseling and coordination of care with the patient  Signed, Ida Rogue, MD  07/28/2019, 10:19 AM    For questions or updates, please contact   Please consult [email or web address] for contact info under Cardiology/STEMI.

## 2019-07-31 ENCOUNTER — Telehealth: Payer: Self-pay

## 2019-07-31 LAB — GLUCOSE, CAPILLARY: Glucose-Capillary: 180 mg/dL — ABNORMAL HIGH (ref 70–99)

## 2019-07-31 NOTE — Telephone Encounter (Signed)
Reviewed cardiology note- Pt is going to continue on Glipizide and insulin and not take Tradjenta since this is not cover by insurance.   CM

## 2019-07-31 NOTE — Telephone Encounter (Signed)
S/w patient's significant other, Olin Hauser, ok per dpr. She said they got everything figured out and patient would rather stay on glipizide and insulin than start the [place]. Advised them to f/u with PCP regarding any other questions and diabetes management. She verbalized understanding.

## 2019-07-31 NOTE — Telephone Encounter (Signed)
Transition Care Management Follow-up Telephone Call  Date of discharge and from where: 07/28/19 [facility]  How have you been since you were released from the hospital? Pt states he is doing okay  Any questions or concerns? No   Items Reviewed:  Did the pt receive and understand the discharge instructions provided? Yes    Medications obtained and verified? Yes  pt states he will not take tradjenta due to side effects but will take glipizide as prescribed   Any new allergies since your discharge? No   Dietary orders reviewed? Yes  Do you have support at home? Yes   Functional Questionnaire: (I = Independent and D = Dependent) ADLs: I  Bathing/Dressing- I  Meal Prep- I  Eating- I  Maintaining continence- I  Transferring/Ambulation- I  Managing Meds- I  Follow up appointments reviewed:   PCP Hospital f/u appt confirmed? No pt declind to schedule appt due to no early morning appts available.   [facility] f/u appt confirmed? Yes  Scheduled to see Darylene Price FNP on 08/04/19 @ 2:00 - Pt states he refuses to see NP because he's paying for a doctor. Advised to discuss with clinic. .  Are transportation arrangements needed? No   If their condition worsens, is the pt aware to call PCP or go to the Emergency Dept.? Yes  Was the patient provided with contact information for the PCP's office or ED? Yes  Was to pt encouraged to call back with questions or concerns? Yes

## 2019-08-01 ENCOUNTER — Encounter: Payer: Medicare HMO | Admitting: Occupational Therapy

## 2019-08-02 ENCOUNTER — Telehealth: Payer: Self-pay | Admitting: Cardiovascular Disease

## 2019-08-02 NOTE — Telephone Encounter (Signed)
-----   Message -----  From: Minna Merritts, MD  Sent: 07/28/2019 10:25 AM EDT  To: Rebeca Alert Burl Scheduling   Leaving the hospital Friday, May 7  Needs follow-up with APP and Dr. Saunders Revel post hospitalization  Would try to stagger with CHF clinic appointment  Thx  TG     Doreatha Martin, Kathlene November, MD; End, Harrell Gave, MD  Fyi patient declined and says he doesn't need an appt. Patient SO will call back if he changes his mind.    Delsa Sale     Very concerning he does not want follow-up in clinic He had severe heart failure in the hospital, woody edema with massive fluid retention Took many days to get a lot of the fluid off He was somewhat agitated in the hospital with visits to his room from nursing, nursing assistance 1 example : He told the CNA coming in to change the battery on the telemetry box to get out of his room, she was bothering him.  He is reluctant to go to CHF clinic, does not want to go to [facility] for any appointments (advanced heart failure clinic) Now declining to follow-up with cardiology clinic local to him that helped manage his fluid in the hospital  If he is declining ongoing care of his cardiac issues, he is high risk of readmission and we would recommend we start a discussion concerning hospice.  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  He discussed these aspirations with me in the hospital  Signed, Esmond Plants, MD, Ph.D [facility]

## 2019-08-04 ENCOUNTER — Ambulatory Visit: Payer: Medicare HMO | Admitting: Family

## 2019-08-08 ENCOUNTER — Encounter: Payer: Medicare HMO | Admitting: Occupational Therapy

## 2019-08-08 ENCOUNTER — Telehealth: Payer: Self-pay | Admitting: Internal Medicine

## 2019-08-08 NOTE — Telephone Encounter (Signed)
No answer. VM is full and cannot accept messages at this time.

## 2019-08-08 NOTE — Telephone Encounter (Signed)
Pt c/o medication issue:  1. Name of Medication:  meds changed at hospital dc   2. How are you currently taking this medication (dosage and times per day)?   3. Are you having a reaction (difficulty breathing--STAT)? Hives and itching   4. What is your medication issue? Reaction to med change at hospital dc   Patient declines visit sooner with APP only Dr. Saunders Revel

## 2019-08-11 ENCOUNTER — Other Ambulatory Visit: Payer: Self-pay

## 2019-08-11 ENCOUNTER — Ambulatory Visit (INDEPENDENT_AMBULATORY_CARE_PROVIDER_SITE_OTHER): Payer: Medicare HMO | Admitting: Internal Medicine

## 2019-08-11 ENCOUNTER — Encounter: Payer: Self-pay | Admitting: Internal Medicine

## 2019-08-11 VITALS — BP 140/68 | HR 64 | Ht 68.0 in | Wt 235.1 lb

## 2019-08-11 DIAGNOSIS — I2510 Atherosclerotic heart disease of native coronary artery without angina pectoris: Secondary | ICD-10-CM | POA: Diagnosis not present

## 2019-08-11 DIAGNOSIS — I10 Essential (primary) hypertension: Secondary | ICD-10-CM | POA: Diagnosis not present

## 2019-08-11 DIAGNOSIS — R21 Rash and other nonspecific skin eruption: Secondary | ICD-10-CM

## 2019-08-11 DIAGNOSIS — I5032 Chronic diastolic (congestive) heart failure: Secondary | ICD-10-CM

## 2019-08-11 DIAGNOSIS — N184 Chronic kidney disease, stage 4 (severe): Secondary | ICD-10-CM

## 2019-08-11 DIAGNOSIS — I4819 Other persistent atrial fibrillation: Secondary | ICD-10-CM

## 2019-08-11 MED ORDER — ISOSORBIDE MONONITRATE ER 30 MG PO TB24: 60.0000 mg | ORAL_TABLET | Freq: Every day | ORAL | 1 refills | Status: DC

## 2019-08-11 NOTE — Patient Instructions (Addendum)
Other Instructions You may take over-the-counter antihistamine such as Zyrtec or Allegra for rash. Please contact your PCP for topical treatment of your rash.  Medication Instructions:  Your physician has recommended you make the following change in your medication:  1- STOP Spironolactone. 2- DECREASE Imdur to 60 mg by mouth once a day.  *If you need a refill on your cardiac medications before your next appointment, please call your pharmacy*  Labwork: Your physician recommends that you return for lab work in: TODAY - BMP.    Follow-Up: At [facility], you and your health needs are our priority.  As part of our continuing mission to provide you with exceptional heart care, we have created designated Provider Care Teams.  These Care Teams include your primary Cardiologist (physician) and Advanced Practice Providers (APPs -  Physician Assistants and Nurse Practitioners) who all work together to provide you with the care you need, when you need it.  We recommend signing up for the patient portal called "MyChart".  Sign up information is provided on this After Visit Summary.  MyChart is used to connect with patients for Virtual Visits (Telemedicine).  Patients are able to view lab/test results, encounter notes, upcoming appointments, etc.  Non-urgent messages can be sent to your provider as well.   To learn more about what you can do with MyChart, go to [email or web address].    Your next appointment:   2 week(s)  The format for your next appointment:   In Person  Provider:    You may see Nelva Bush, MD or one of the following Advanced Practice Providers on your designated Care Team:    Murray Hodgkins, NP  Christell Faith, PA-C  Marrianne Mood, [place]-

## 2019-08-11 NOTE — Progress Notes (Signed)
Follow-up Outpatient Visit Date: 08/11/2019  Primary Care Provider: Glean Hess, MD [address] [place] [address]  Chief Complaint: Follow-up heart failure  HPI:  Kyle Roberson is a 79 y.o. male with history of coronary artery disease, persistent atrial fibrillation, hypertension, hyperlipidemia, type 2 diabetes mellitus, chronic kidney disease, GERD, and hyperparathyroidism, who presents for follow-up of chronic HFpEF and persistent atrial fibrillation.  I last saw him in late April, at which time Kyle Roberson was still significantly volume overloaded despite escalation of outpatient diuretics.  He was directly admitted to the hospital and underwent extensive IV diuresis.  Right heart catheterization was discussed, but the patient ultimately declined this.  Today, Kyle Roberson reports continuing to feel well. His leg swelling has improved further since hospital discharge despite self-titration of torsemide to 40 mg daily (he only to 20 mg x 1 yesterday).  His weight is still trending down.  He has mild exertional dyspnea, much improved compared to prior to last office visit and direct admission to the hospital.  He has been able to lie flat in bed for the first time and quite some time.  He has not had any chest pain, palpitations, and lightheadedness.  Since leaving the hospital, Kyle Roberson has developed a pruritic rash on his chest and back.  He wonders if it could be due to his medications (only meds that were recently added are spironolactone and isosorbide mononitrate).  --------------------------------------------------------------------------------------------------  Cardiovascular History & Procedures: Cardiovascular Problems:  Coronary artery disease  HFpEF  Atrial fibrillation  Risk Factors:  Known CAD, hypertension, hyperlipidemia, diabetes mellitus, male gender, obesity, and age > 93  Cath/PCI:  None available.  CV Surgery:  None  EP  Procedures and Devices:  None  Non-Invasive Evaluation(s):  Renal artery Doppler (05/29/2019): Limited study due to overlying bowel gas. Normal kidney sizes without severe stenosis. Resistive indices elevated in both kidneys.  TTE (05/08/2019): Normal LV size with mild LVH. LVEF 55-60%. Normal RV size and function with moderate pulmonary hypertension (RVSP 56 mmHg). Mild to moderate MR was noted, as well as mild AI. CVP was severely elevated.  TTE (07/07/2018): Nromal LV size with LVEF 60-65%. Unable to assess diastolic function. Mildly reduced RV function with mild RV dilation and thickening. Aortic sclerosis noted. Unable to assess aortic regurgitation.  Recent CV Pertinent Labs: Lab Results  Component Value Date   CHOL 137 07/22/2018   HDL 44 07/22/2018   LDLCALC 73 07/22/2018   TRIG 116 07/22/2018   BNP 224.0 (H) 07/14/2019   K 3.7 07/28/2019   MG 2.3 07/22/2019   BUN 73 (H) 07/28/2019   BUN 42 (H) 05/31/2019   CREATININE 2.78 (H) 07/28/2019    Past medical and surgical history were reviewed and updated in EPIC.  Current Meds  Medication Sig  . apixaban (ELIQUIS) 2.5 MG TABS tablet Take 1 tablet (2.5 mg total) by mouth 2 (two) times daily.  . Ascorbic Acid (VITAMIN C) 1000 MG tablet Take 1,000 mg by mouth daily.  . cholecalciferol (VITAMIN D3) 25 MCG (1000 UT) tablet Take 2,000 Units by mouth daily.   . Flaxseed, Linseed, (FLAX SEED OIL) 1000 MG CAPS Take by mouth.  Marland Kitchen glipiZIDE (GLUCOTROL XL) 10 MG 24 hr tablet Take 10 mg by mouth 2 (two) times daily.  . hydrALAZINE (APRESOLINE) 100 MG tablet Take 1 tablet (100 mg total) by mouth 3 (three) times daily.  . insulin NPH Human (NOVOLIN N) 100 UNIT/ML injection Inject 10-15 Units into  the skin daily. Take 10-15 units twice a day after breakfast and before bedtime per Sliding Scale (BS>250: 15 units, BS= 200-250:10 units)  . isosorbide mononitrate (IMDUR) 30 MG 24 hr tablet Take 3 tablets (90 mg total) by mouth daily.  Marland Kitchen  levalbuterol (XOPENEX HFA) 45 MCG/ACT inhaler Inhale 2 puffs into the lungs every 6 (six) hours as needed for wheezing or shortness of breath.  Marland Kitchen MAXITROL 3.5-10000-0.1 OINT Place 1 application into the left eye 2 (two) times daily.   . Multiple Vitamins-Minerals (MULTIVITAMIN WITH MINERALS) tablet Take 1 tablet by mouth daily.  . nitroGLYCERIN (NITROLINGUAL) 0.4 MG/SPRAY spray Place 1 spray under the tongue as directed.  . Omega 3 1000 MG CAPS Take 1 capsule by mouth daily.  . rosuvastatin (CRESTOR) 5 MG tablet Take 1 tablet (5 mg total) by mouth 3 (three) times a week.  . silver sulfADIAZINE (SILVADENE) 1 % cream Apply 1 application topically 2 (two) times daily. To leg wounds  . spironolactone (ALDACTONE) 25 MG tablet Take 1 tablet (25 mg total) by mouth daily.  Marland Kitchen torsemide (DEMADEX) 20 MG tablet Take 60 mg by mouth 2 (two) times daily.     Allergies: Atorvastatin and Simvastatin  Social History   Tobacco Use  . Smoking status: Former Research scientist (life sciences)  . Smokeless tobacco: Never Used  Substance Use Topics  . Alcohol use: Not Currently    Alcohol/week: 2.0 standard drinks    Types: 2 Glasses of wine per week  . Drug use: Not Currently    Family History  Problem Relation Age of Onset  . Pancreatic cancer Mother   . CAD Father   . Diabetes Brother     Review of Systems: A 12-system review of systems was performed and was negative except as noted in the HPI.  --------------------------------------------------------------------------------------------------  Physical Exam: BP 140/68 (BP Location: Right Arm, Patient Position: Sitting, Cuff Size: Normal)   Pulse 64   Ht 5\' 8"  (1.727 m)   Wt 235 lb 2 oz (106.7 kg)   SpO2 98%   BMI 35.75 kg/m   General:  NAD. Neck: No JVD; HJR present. Lungs: Normal work of breathing. Clear to auscultation bilaterally without wheezes or crackles. Heart: Distant heart sounds.  Irregular without murmurs. Abd: Bowel sounds present. Soft, NT/ND without  hepatosplenomegaly Ext: 1+ woody edema in both calves. Skin: Erythematous macular rash on torso with areas of excoriation.  EKG:  Atrial fibrillation with inferior Q waves and poor R wave progression.  Lab Results  Component Value Date   WBC 8.2 07/28/2019   HGB 10.6 (L) 07/28/2019   HCT 34.5 (L) 07/28/2019   MCV 86.3 07/28/2019   PLT 207 07/28/2019    Lab Results  Component Value Date   NA 139 07/28/2019   K 3.7 07/28/2019   CL 92 (L) 07/28/2019   CO2 33 (H) 07/28/2019   BUN 73 (H) 07/28/2019   CREATININE 2.78 (H) 07/28/2019   GLUCOSE 168 (H) 07/28/2019   ALT 17 07/14/2019    Lab Results  Component Value Date   CHOL 137 07/22/2018   HDL 44 07/22/2018   LDLCALC 73 07/22/2018   TRIG 116 07/22/2018    --------------------------------------------------------------------------------------------------  ASSESSMENT AND PLAN: Chronic HFpEF: Volume status significant better following recent hospitalization for aggressive diuresis.  He was discharged on torsemide 60 mg BID, though Kyle Roberson has cut down to 40 mg daily.  Given continued improvement in edema and weight loss, I think it is reasonable to continue this.  I  asked him to monitor his weight and edema closely, as dose escalation will be needed if there is any evidence of recurrent fluid retention.  I will check a BMP today.  Given itching/rash, we have agreed to hold spironolactone to see if symptoms improve.  I am also reluctant to continue this medication long term in the setting of Kyle Roberson chronic kidney disease.  Coronary artery disease: No angina.  Continue current medications for secondary prevention.  If rash/itching persist, we will need to consider weaning of Imdur.  Persistent atrial fibrillation: Low normal ventricular rate.  Continue anticoagulation with apixaban.  Chronic kidney disease stage IV: We will check BMP today and hold spironolactone, as above.  Continue follow-up with  nephrology.  Hypertension: BP mildly elevated.  We will hold spironolactone and defer additional medication changes at this time.  Hopefully, BP will improve with further diuresis.  Rash/itching: Potential due to recently added medication.  We have agreed to hold spironolactone to see if symptoms improve.  As itching has improved for a few hours at a time with diphenhydramine, I suggested trial of a longer acting antihistamine such as loratadine, cetirizine, or fexofenadine.  He should reach out to his PCP if he feels like topical treatment is needed.  Follow-up: Return to clinic in 2 weeks.  Nelva Bush, MD 08/11/2019 8:33 AM

## 2019-08-12 LAB — BASIC METABOLIC PANEL
BUN/Creatinine Ratio: 17 (ref 10–24)
BUN: 45 mg/dL — ABNORMAL HIGH (ref 8–27)
CO2: 25 mmol/L (ref 20–29)
Calcium: 10 mg/dL (ref 8.6–10.2)
Chloride: 101 mmol/L (ref 96–106)
Creatinine, Ser: 2.72 mg/dL — ABNORMAL HIGH (ref 0.76–1.27)
GFR calc Af Amer: 24 mL/min/{1.73_m2} — ABNORMAL LOW (ref >59)
GFR calc non Af Amer: 21 mL/min/{1.73_m2} — ABNORMAL LOW (ref >59)
Glucose: 85 mg/dL (ref 65–99)
Potassium: 4.1 mmol/L (ref 3.5–5.2)
Sodium: 141 mmol/L (ref 134–144)

## 2019-08-15 ENCOUNTER — Encounter: Payer: Medicare HMO | Admitting: Occupational Therapy

## 2019-08-22 ENCOUNTER — Encounter: Payer: Medicare HMO | Admitting: Occupational Therapy

## 2019-08-23 ENCOUNTER — Ambulatory Visit: Payer: Medicare HMO | Admitting: Pulmonary Disease

## 2019-08-24 ENCOUNTER — Ambulatory Visit: Payer: Medicare HMO | Admitting: Internal Medicine

## 2019-08-25 ENCOUNTER — Encounter: Payer: Medicare HMO | Admitting: Occupational Therapy

## 2019-08-28 ENCOUNTER — Telehealth: Payer: Self-pay | Admitting: Internal Medicine

## 2019-08-28 NOTE — Telephone Encounter (Signed)
Happy to help  Very challenging patient.  He did not understand the severity of his disease He made other comments in the hospital seemed detached from reality, Following discharge he declined follow-up with our office or advanced CHF clinic in [place] Was agitated with the nurses in the hospital, yelling out at them We will do the best we can do

## 2019-08-28 NOTE — Telephone Encounter (Signed)
Patients caregiver calling in to request a change in providers from Dr. Saunders Revel to [name]. The reasoning is based off a recent hospital visit where [facility] cared for him. And some other prescription differences that were unable to be taken care of.  Please advise on your answers

## 2019-08-28 NOTE — Telephone Encounter (Signed)
That is fine with me.  Nelva Bush, MD [facility]

## 2019-08-29 ENCOUNTER — Encounter: Payer: Medicare HMO | Admitting: Occupational Therapy

## 2019-08-30 ENCOUNTER — Encounter: Payer: Medicare HMO | Admitting: Occupational Therapy

## 2019-09-01 ENCOUNTER — Encounter: Payer: Medicare HMO | Admitting: Occupational Therapy

## 2019-09-01 ENCOUNTER — Other Ambulatory Visit: Payer: Self-pay | Admitting: Internal Medicine

## 2019-09-01 DIAGNOSIS — I2510 Atherosclerotic heart disease of native coronary artery without angina pectoris: Secondary | ICD-10-CM

## 2019-09-01 NOTE — Telephone Encounter (Signed)
Requested Prescriptions  Pending Prescriptions Disp Refills  . rosuvastatin (CRESTOR) 5 MG tablet [Pharmacy Med Name: ROSUVASTATIN 5MG  TABLETS] 90 tablet 0    Sig: TAKE 1 TABLET(5 MG) BY MOUTH 3 TIMES A WEEK     Cardiovascular:  Antilipid - Statins Failed - 09/01/2019 10:03 AM      Failed - Total Cholesterol in normal range and within 360 days    Cholesterol  Date Value Ref Range Status  07/22/2018 137 0 - 200 Final         Failed - LDL in normal range and within 360 days    LDL Cholesterol  Date Value Ref Range Status  07/22/2018 73  Final         Failed - HDL in normal range and within 360 days    HDL  Date Value Ref Range Status  07/22/2018 44 35 - 70 Final         Failed - Triglycerides in normal range and within 360 days    Triglycerides  Date Value Ref Range Status  07/22/2018 116 40 - 160 Final         Passed - Patient is not pregnant      Passed - Valid encounter within last 12 months    Recent Outpatient Visits          1 month ago Type II diabetes mellitus with complication [facility])   [facility] Glean Hess, MD   3 months ago Chronic heart failure with preserved ejection fraction (HFpEF) [facility])   [facility] Glean Hess, MD   4 months ago Type II diabetes mellitus with complication [facility])   [facility] Glean Hess, MD   7 months ago Type II diabetes mellitus with complication [facility])   [facility] Glean Hess, MD   9 months ago Type II diabetes mellitus with complication [facility])   [facility] Glean Hess, MD      Future Appointments            In 2 weeks Gollan, Kathlene November, MD [facility]   In 1 month Tyler Pita, MD  Pulmonary [place]   In 1 month Army Melia, Jesse Sans, MD [facility], [facility]

## 2019-09-05 ENCOUNTER — Encounter: Payer: Medicare HMO | Admitting: Occupational Therapy

## 2019-09-06 ENCOUNTER — Encounter: Payer: Medicare HMO | Admitting: Occupational Therapy

## 2019-09-08 ENCOUNTER — Encounter: Payer: Medicare HMO | Admitting: Occupational Therapy

## 2019-09-08 ENCOUNTER — Other Ambulatory Visit: Payer: Self-pay | Admitting: *Deleted

## 2019-09-08 DIAGNOSIS — I5032 Chronic diastolic (congestive) heart failure: Secondary | ICD-10-CM

## 2019-09-12 ENCOUNTER — Encounter: Payer: Medicare HMO | Admitting: Occupational Therapy

## 2019-09-13 ENCOUNTER — Encounter: Payer: Medicare HMO | Admitting: Occupational Therapy

## 2019-09-15 ENCOUNTER — Encounter: Payer: Medicare HMO | Admitting: Occupational Therapy

## 2019-09-16 NOTE — Progress Notes (Addendum)
Cardiology Office Note  Date:  09/18/2019   ID:  Kyle Roberson, DOB Oct 23, 1940, MRN [id number]  PCP:  Glean Hess, MD   Chief Complaint  Patient presents with  . office visit    Patient reports hives which he associates with isosorbide; Meds verbally reviewed with patient.    HPI:  Kyle Roberson is a 79 y.o. male with history of  coronary artery disease,  persistent atrial fibrillation,  hypertension,  hyperlipidemia,  type 2 diabetes mellitus,  chronic kidney disease,  GERD, and  hyperparathyroidism,  who presents for follow-up of chronic HFpEF and persistent atrial fibrillation.    Seen in clinic April 2021,  significantly volume overloaded despite escalation of outpatient diuretics.  directly admitted to the hospital and underwent extensive IV diuresis.   Right heart catheterization was discussed, but the patient ultimately declined this. He was discharged on torsemide 60 twice daily He cut the dose down to 40 daily  Was seen in the clinic May 2021  Weight was down, was able to lay flat He did report a rash Spironolactone was held to see if this would help with his rash  Creatinine 2.78 BUN 73 in Jul 28, 2019 Creatinine 1.8 in February 2021  Presents today with his wife Today still with rash, chest back legs Despite holding spironolactone rash has persisted Wife concerned about Imdur, otherwise no new medication changes They do have some ointments they have tried which seem to help somewhat but they run out of the ointment, they will call primary care  He is minimally active but feeling better, leg swelling stable, eating better, does not go out Wife is an active part of managing his medications  Recent lab work reviewed with him from nephrology, creatinine 2.3 potassium 4.2  EKG personally reviewed by myself on todays visit Shows atrial fibrillation ventricular rate 47 bpm  old anterior MI   PMH:   has a past medical history of AKI (acute kidney  injury) ([place]) (07/06/2018), CAD (coronary artery disease), Diabetes ([place]), GERD (gastroesophageal reflux disease), HLD (hyperlipidemia), and HTN (hypertension).  PSH:    Past Surgical History:  Procedure Laterality Date  . CARDIAC CATHETERIZATION    . CORONARY STENT INTERVENTION    . CYSTOSCOPY    . HERNIA REPAIR      Current Outpatient Medications  Medication Sig Dispense Refill  . apixaban (ELIQUIS) 2.5 MG TABS tablet Take 1 tablet (2.5 mg total) by mouth 2 (two) times daily. 180 tablet 1  . Ascorbic Acid (VITAMIN C) 1000 MG tablet Take 1,000 mg by mouth daily.    . cholecalciferol (VITAMIN D3) 25 MCG (1000 UT) tablet Take 2,000 Units by mouth daily.     . Flaxseed, Linseed, (FLAX SEED OIL) 1000 MG CAPS Take by mouth.    Marland Kitchen glipiZIDE (GLUCOTROL XL) 10 MG 24 hr tablet Take 10 mg by mouth 2 (two) times daily.    . hydrALAZINE (APRESOLINE) 100 MG tablet Take 1 tablet (100 mg total) by mouth 3 (three) times daily. 90 tablet 1  . insulin NPH Human (NOVOLIN N) 100 UNIT/ML injection Inject 10-15 Units into the skin daily. Take 10-15 units twice a day after breakfast and before bedtime per Sliding Scale (BS>250: 15 units, BS= 200-250:10 units)    . isosorbide mononitrate (IMDUR) 30 MG 24 hr tablet Take 2 tablets (60 mg total) by mouth daily. 90 tablet 1  . levalbuterol (XOPENEX HFA) 45 MCG/ACT inhaler Inhale 2 puffs into the lungs every 6 (six) hours as needed  for wheezing or shortness of breath. 1 Inhaler 12  . MAXITROL 3.5-10000-0.1 OINT Place 1 application into the left eye 2 (two) times daily.     . Multiple Vitamins-Minerals (MULTIVITAMIN WITH MINERALS) tablet Take 1 tablet by mouth daily.    . nitroGLYCERIN (NITROLINGUAL) 0.4 MG/SPRAY spray Place 1 spray under the tongue as directed. 12 g 1  . Omega 3 1000 MG CAPS Take 1 capsule by mouth daily.    . rosuvastatin (CRESTOR) 5 MG tablet TAKE 1 TABLET(5 MG) BY MOUTH 3 TIMES A WEEK 90 tablet 0  . silver sulfADIAZINE (SILVADENE) 1 % cream Apply 1  application topically 2 (two) times daily. To leg wounds 50 g 2  . torsemide (DEMADEX) 20 MG tablet Take 60 mg by mouth 2 (two) times daily.      No current facility-administered medications for this visit.     Allergies:   Atorvastatin and Simvastatin   Social History:  The patient  reports that he has quit smoking. He has never used smokeless tobacco. He reports previous alcohol use of about 2.0 standard drinks of alcohol per week. He reports previous drug use.   Family History:   family history includes CAD in his father; Diabetes in his brother; Pancreatic cancer in his mother.    Review of Systems: Review of Systems  Constitutional: Negative.   Respiratory: Positive for shortness of breath.   Cardiovascular: Negative.   Gastrointestinal: Negative.   Musculoskeletal: Negative.        Legs are weak  Neurological: Negative.   Psychiatric/Behavioral: Negative.   All other systems reviewed and are negative.    PHYSICAL EXAM: VS:  BP (!) 144/60 (BP Location: Right Arm, Patient Position: Sitting, Cuff Size: Normal)   Pulse (!) 47   SpO2 96%  , BMI There is no height or weight on file to calculate BMI.  Presents today in a wheelchair GEN: Well nourished, well developed, in no acute distress HEENT: normal Neck: no JVD, carotid bruits, or masses Cardiac: RRR; no murmurs, rubs, or gallops, trace lower extremity edema  Respiratory:  clear to auscultation bilaterally, normal work of breathing GI: soft, nontender, nondistended, + BS MS: no deformity or atrophy Skin: warm and dry, + rash little bit on the trunk, more on the back Neuro:  Strength and sensation are intact Psych: euthymic mood, full affect  Recent Labs: 07/14/2019: ALT 17; B Natriuretic Peptide 224.0 07/22/2019: Magnesium 2.3 07/28/2019: Hemoglobin 10.6; Platelets 207 08/11/2019: BUN 45; Creatinine, Ser 2.72; Potassium 4.1; Sodium 141    Lipid Panel Lab Results  Component Value Date   CHOL 137 07/22/2018   HDL 44  07/22/2018   LDLCALC 73 07/22/2018   TRIG 116 07/22/2018      Wt Readings from Last 3 Encounters:  08/11/19 235 lb 2 oz (106.7 kg)  07/27/19 250 lb 12.8 oz (113.8 kg)  07/19/19 275 lb 6 oz (124.9 kg)       ASSESSMENT AND PLAN:  Problem List Items Addressed This Visit      Cardiology Problems   Chronic heart failure with preserved ejection fraction (HFpEF) ([place]) - Primary   Relevant Orders   EKG 12-Lead   Essential hypertension   Coronary artery disease involving native coronary artery of native heart without angina pectoris   Persistent atrial fibrillation (HCC)   Relevant Orders   EKG 12-Lead     Other   Morbid obesity (HCC)   CKD (chronic kidney disease) stage 4, GFR 15-29 ml/min ([place])  Persistent atrial fibrillation Possibly permanent, will review the records Bradycardic but asymptomatic Tolerating renally dosed Eliquis with no rate control agents  Chronic diastolic CHF Family/wife monitoring weight at home and adjusting his torsemide accordingly He is taking torsemide 40 daily with extra torsemide as needed We will change his prescription to what he is taking Continue daily weights, strict diet, close monitoring of fluid intake  Essential hypertension Wife concerned about Imdur and rash, Imdur was a new medication Rash did not improve by holding spironolactone on last clinic visit We will hold the Imdur for now monitor blood pressure blood pressure runs high we did add extra hydralazine Will make more available with hydralazine 100 mg 4 times daily Continue to hold spironolactone.  Challenging to use this medication in the setting of renal dysfunction, recent potassium 4.2 off spironolactone  Disposition:   F/U  3 months   Extensive discussion concerning CHF management, Discussed medication changes for blood pressure, Discussed daily weights, monitoring of diet, fluid intake  total encounter time more than 45 minutes  Greater than 50% was spent in  counseling and coordination of care with the patient    Signed, Esmond Plants, M.D., Ph.D. [place], [place]

## 2019-09-18 ENCOUNTER — Encounter: Payer: Self-pay | Admitting: Cardiovascular Disease

## 2019-09-18 ENCOUNTER — Ambulatory Visit (INDEPENDENT_AMBULATORY_CARE_PROVIDER_SITE_OTHER): Payer: Medicare HMO | Admitting: Cardiovascular Disease

## 2019-09-18 VITALS — BP 144/60 | HR 47 | Ht 68.0 in | Wt 241.1 lb

## 2019-09-18 DIAGNOSIS — I2510 Atherosclerotic heart disease of native coronary artery without angina pectoris: Secondary | ICD-10-CM | POA: Diagnosis not present

## 2019-09-18 DIAGNOSIS — I4819 Other persistent atrial fibrillation: Secondary | ICD-10-CM | POA: Diagnosis not present

## 2019-09-18 DIAGNOSIS — I5032 Chronic diastolic (congestive) heart failure: Secondary | ICD-10-CM | POA: Diagnosis not present

## 2019-09-18 DIAGNOSIS — N184 Chronic kidney disease, stage 4 (severe): Secondary | ICD-10-CM | POA: Diagnosis not present

## 2019-09-18 DIAGNOSIS — I10 Essential (primary) hypertension: Secondary | ICD-10-CM

## 2019-09-18 MED ORDER — HYDRALAZINE HCL 100 MG PO TABS: 100.0000 mg | ORAL_TABLET | Freq: Four times a day (QID) | ORAL | 3 refills | Status: DC

## 2019-09-18 MED ORDER — TORSEMIDE 20 MG PO TABS: 40.0000 mg | ORAL_TABLET | Freq: Every day | ORAL | 3 refills | Status: DC | PRN

## 2019-09-18 MED ORDER — HYDRALAZINE HCL 100 MG PO TABS: 100.0000 mg | ORAL_TABLET | Freq: Three times a day (TID) | ORAL | 3 refills | Status: DC

## 2019-09-18 NOTE — Patient Instructions (Signed)
Medication Instructions:  Please hold the imdur to see if this helps with rash Continue to hold the spironolactone  Please increase the hydralazine up to 100 mg four times a day  If you need a refill on your cardiac medications before your next appointment, please call your pharmacy.    Lab work: No new labs needed   If you have labs (blood work) drawn today and your tests are completely normal, you will receive your results only by: Marland Kitchen MyChart Message (if you have MyChart) OR . A paper copy in the mail If you have any lab test that is abnormal or we need to change your treatment, we will call you to review the results.   Testing/Procedures: No new testing needed   Follow-Up: At [facility], you and your health needs are our priority.  As part of our continuing mission to provide you with exceptional heart care, we have created designated Provider Care Teams.  These Care Teams include your primary Cardiologist (physician) and Advanced Practice Providers (APPs -  Physician Assistants and Nurse Practitioners) who all work together to provide you with the care you need, when you need it.  . You will need a follow up appointment in 3 months   . Providers on your designated Care Team:   . Murray Hodgkins, NP . Christell Faith, PA-C . Marrianne Mood, PA-C  Any Other Special Instructions Will Be Listed Below (If Applicable).  COVID-19 Vaccine Information can be found at: [email or web address] For questions related to vaccine distribution or appointments, please email [email or web address] or call [phone].

## 2019-09-19 ENCOUNTER — Encounter: Payer: Medicare HMO | Admitting: Occupational Therapy

## 2019-09-20 IMAGING — CR CHEST - 2 VIEW
2 series · 2 of 2 positions shown · non-contrast
Comparison: 07/06/2018

CLINICAL DATA: Shortness of breath, leg swelling

EXAM:
CHEST - 2 VIEW

[chest pa]
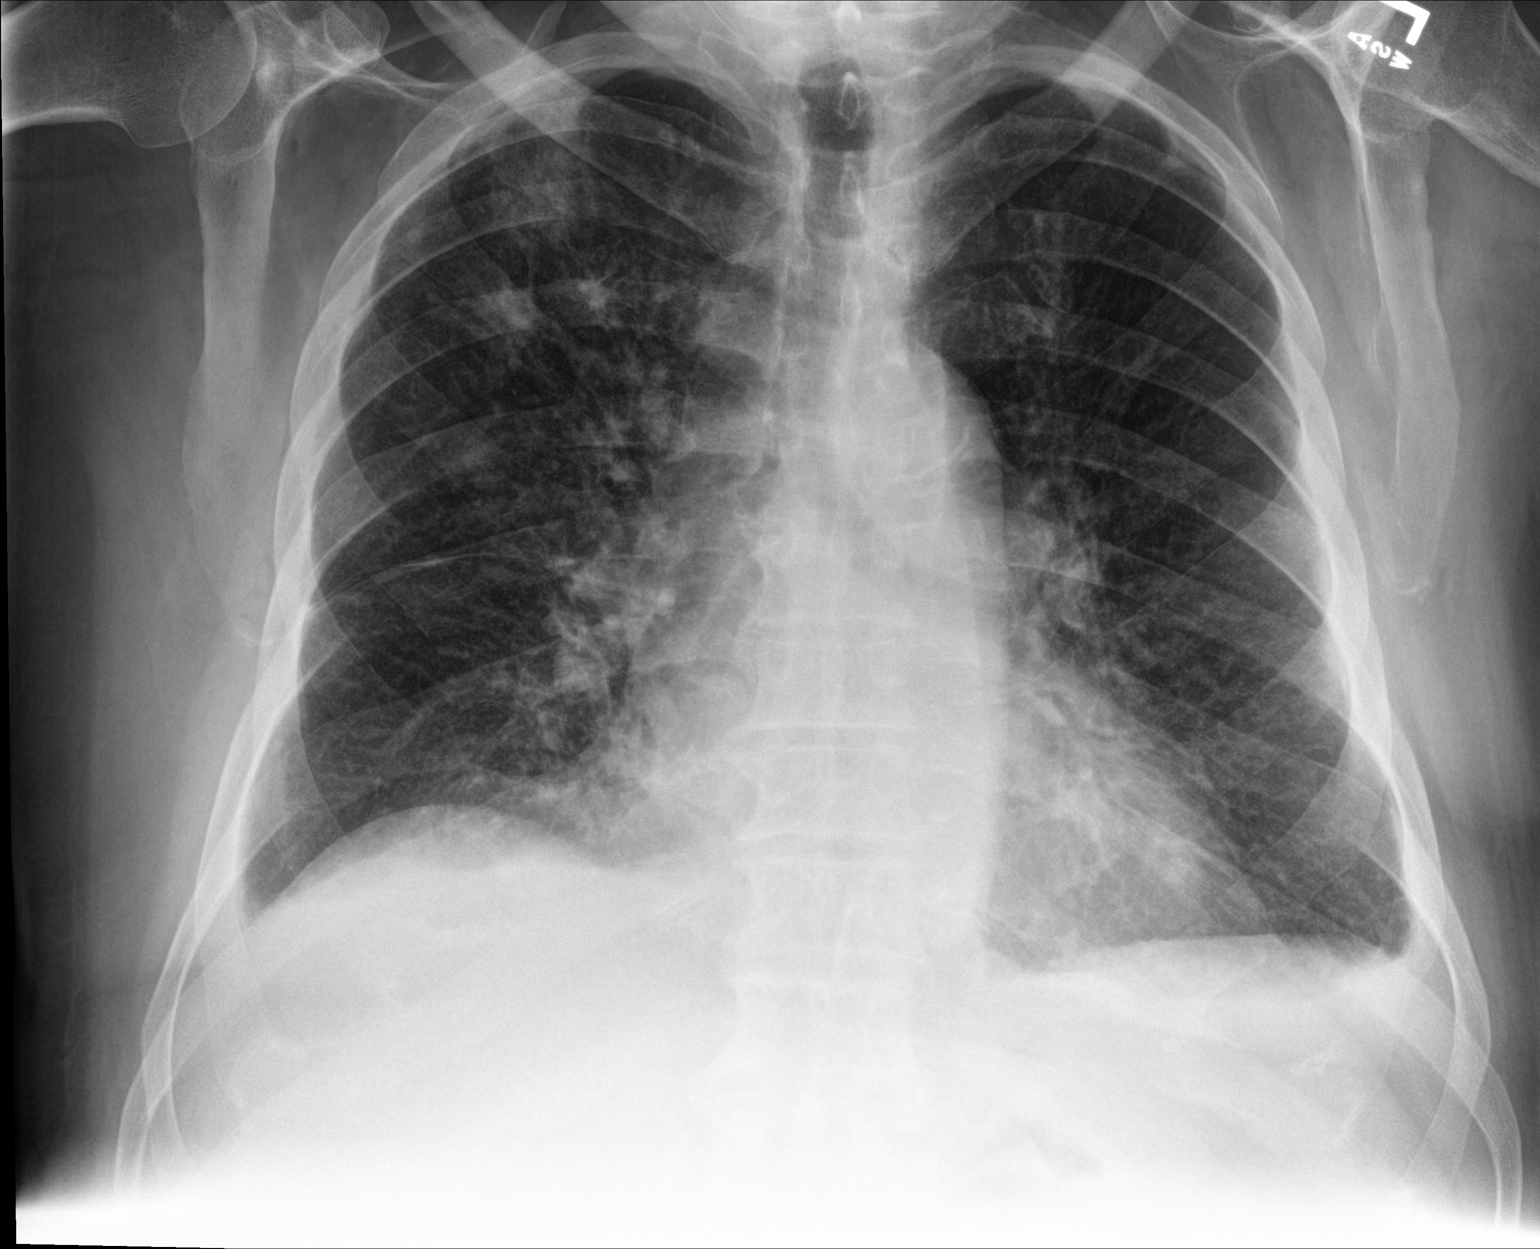

[chest lat]
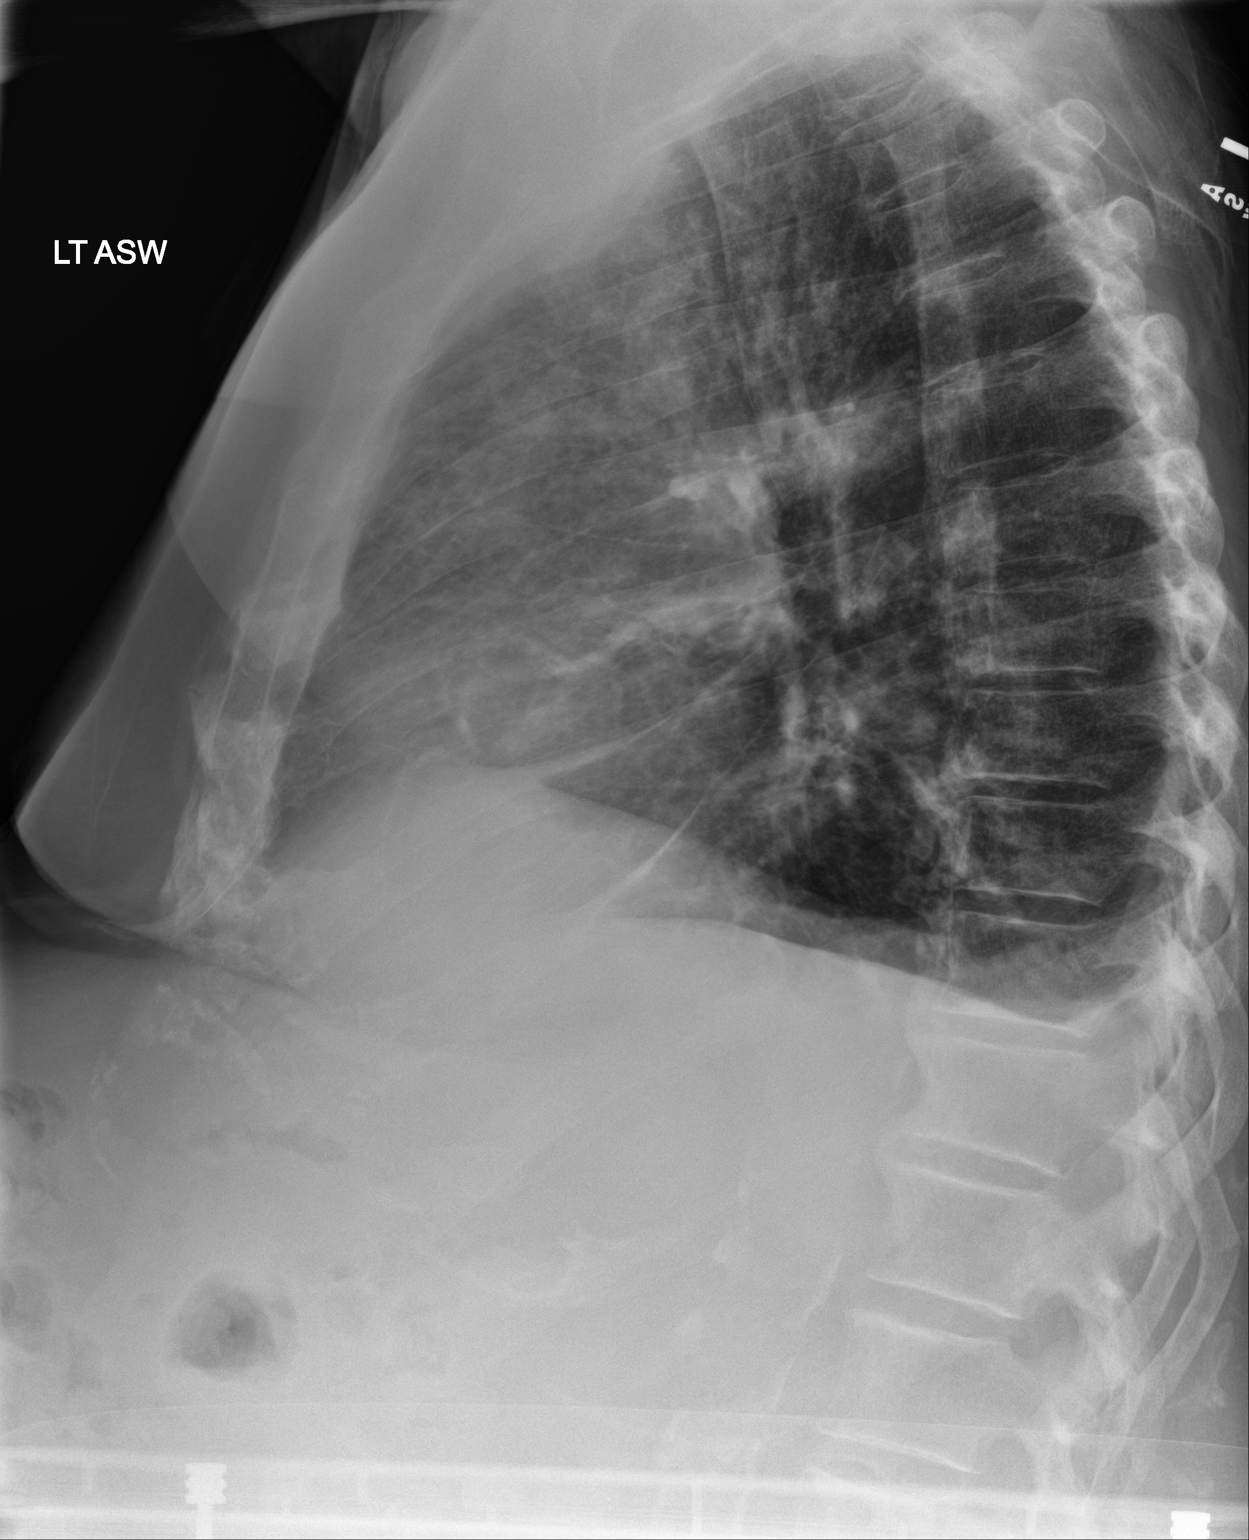

[2 of 2 positions shown; findings below may reference images not displayed]

FINDINGS: Small bilateral pleural effusions. Patchy nodular airspace disease
in the right upper lobe appears new since prior study, concerning
for pneumonia. No confluent opacity on the left. Heart is normal
size. No acute bony abnormality.
IMPRESSION: Small bilateral effusions.

Patchy nodular airspace disease in the right upper lobe appears new
and is concerning for pneumonia. Followup PA and lateral chest X-ray
is recommended in 3-4 weeks following trial of antibiotic therapy to
ensure resolution and exclude underlying malignancy.

## 2019-09-20 NOTE — Addendum Note (Signed)
Addended by: Minna Merritts on: 09/20/2019 12:35 PM   Modules accepted: Level of Service

## 2019-09-21 ENCOUNTER — Encounter: Payer: Medicare HMO | Admitting: Occupational Therapy

## 2019-09-22 ENCOUNTER — Encounter: Payer: Medicare HMO | Admitting: Occupational Therapy

## 2019-09-26 ENCOUNTER — Encounter: Payer: Medicare HMO | Admitting: Occupational Therapy

## 2019-09-28 ENCOUNTER — Encounter: Payer: Medicare HMO | Admitting: Occupational Therapy

## 2019-09-29 ENCOUNTER — Encounter: Payer: Medicare HMO | Admitting: Occupational Therapy

## 2019-10-03 ENCOUNTER — Encounter: Payer: Medicare HMO | Admitting: Occupational Therapy

## 2019-10-04 ENCOUNTER — Ambulatory Visit: Payer: Medicare HMO | Admitting: Internal Medicine

## 2019-10-05 ENCOUNTER — Encounter: Payer: Medicare HMO | Admitting: Occupational Therapy

## 2019-10-06 ENCOUNTER — Encounter: Payer: Medicare HMO | Admitting: Occupational Therapy

## 2019-10-10 ENCOUNTER — Encounter: Payer: Medicare HMO | Admitting: Occupational Therapy

## 2019-10-12 ENCOUNTER — Encounter: Payer: Medicare HMO | Admitting: Occupational Therapy

## 2019-10-12 ENCOUNTER — Telehealth: Payer: Self-pay | Admitting: Pulmonary Disease

## 2019-10-12 DIAGNOSIS — I2729 Other secondary pulmonary hypertension: Secondary | ICD-10-CM

## 2019-10-12 NOTE — Telephone Encounter (Signed)
Kyle Roberson is aware of recommendations and voiced her understanding.  ONO has been ordered. Kyle Roberson stated that she would call back to schedule a f/u if pt wished to do so.  Nothing further is needed at this time.

## 2019-10-12 NOTE — Telephone Encounter (Signed)
Spoke to pt's spouse, [name] (DPR).  [name] would like for an order to be placed to [facility] to d/c pt's oxygen. Pt feels that he no longer needs oxygen, as his issues with fluid overload have been resolved.  Dr. Patsey Berthold, please advise. Thanks

## 2019-10-12 NOTE — Telephone Encounter (Signed)
He was supposed to follow-up after his April visit and did not.  What I would recommend is to at least do an overnight oximetry on room air to make sure he does not need it at nighttime.  Not because of his lungs but mostly because of his heart.  They can monitor oximetry at home if they have an oxygen meter without the oxygen on to see how he is doing during the day.

## 2019-10-13 ENCOUNTER — Encounter: Payer: Medicare HMO | Admitting: Occupational Therapy

## 2019-10-17 ENCOUNTER — Ambulatory Visit: Payer: Medicare HMO | Admitting: Pulmonary Disease

## 2019-10-17 ENCOUNTER — Encounter: Payer: Medicare HMO | Admitting: Occupational Therapy

## 2019-10-18 ENCOUNTER — Other Ambulatory Visit
Admission: RE | Admit: 2019-10-18 | Discharge: 2019-10-18 | Disposition: A | Discharge status: Home / Self Care | Payer: Medicare HMO | Attending: Internal Medicine | Admitting: Internal Medicine

## 2019-10-18 ENCOUNTER — Other Ambulatory Visit: Payer: Self-pay

## 2019-10-18 ENCOUNTER — Encounter: Payer: Self-pay | Admitting: Internal Medicine

## 2019-10-18 ENCOUNTER — Telehealth: Payer: Self-pay | Admitting: Pulmonary Disease

## 2019-10-18 ENCOUNTER — Ambulatory Visit (INDEPENDENT_AMBULATORY_CARE_PROVIDER_SITE_OTHER): Payer: Medicare HMO | Admitting: Internal Medicine

## 2019-10-18 VITALS — BP 124/78 | HR 67 | Ht 68.0 in | Wt 248.0 lb

## 2019-10-18 DIAGNOSIS — E785 Hyperlipidemia, unspecified: Secondary | ICD-10-CM | POA: Diagnosis not present

## 2019-10-18 DIAGNOSIS — N184 Chronic kidney disease, stage 4 (severe): Secondary | ICD-10-CM

## 2019-10-18 DIAGNOSIS — E1169 Type 2 diabetes mellitus with other specified complication: Secondary | ICD-10-CM | POA: Diagnosis not present

## 2019-10-18 DIAGNOSIS — I10 Essential (primary) hypertension: Secondary | ICD-10-CM | POA: Diagnosis not present

## 2019-10-18 DIAGNOSIS — Z794 Long term (current) use of insulin: Secondary | ICD-10-CM | POA: Insufficient documentation

## 2019-10-18 DIAGNOSIS — E1122 Type 2 diabetes mellitus with diabetic chronic kidney disease: Secondary | ICD-10-CM | POA: Insufficient documentation

## 2019-10-18 DIAGNOSIS — I5032 Chronic diastolic (congestive) heart failure: Secondary | ICD-10-CM | POA: Diagnosis not present

## 2019-10-18 DIAGNOSIS — I872 Venous insufficiency (chronic) (peripheral): Secondary | ICD-10-CM

## 2019-10-18 LAB — BASIC METABOLIC PANEL
Anion gap: 10 (ref 5–15)
BUN: 42 mg/dL — ABNORMAL HIGH (ref 8–23)
CO2: 24 mmol/L (ref 22–32)
Calcium: 8.5 mg/dL — ABNORMAL LOW (ref 8.9–10.3)
Chloride: 105 mmol/L (ref 98–111)
Creatinine, Ser: 2.1 mg/dL — ABNORMAL HIGH (ref 0.61–1.24)
GFR calc Af Amer: 33 mL/min — ABNORMAL LOW (ref >60)
GFR calc non Af Amer: 29 mL/min — ABNORMAL LOW (ref >60)
Glucose, Bld: 87 mg/dL (ref 70–99)
Potassium: 3.9 mmol/L (ref 3.5–5.1)
Sodium: 139 mmol/L (ref 135–145)

## 2019-10-18 LAB — LIPID PANEL
Cholesterol: 123 mg/dL (ref 0–200)
HDL: 35 mg/dL — ABNORMAL LOW (ref >40)
LDL Cholesterol: 67 mg/dL (ref 0–99)
Total CHOL/HDL Ratio: 3.5 RATIO
Triglycerides: 107 mg/dL (ref <150)
VLDL: 21 mg/dL (ref 0–40)

## 2019-10-18 LAB — HEMOGLOBIN A1C
Hgb A1c MFr Bld: 7.8 % — ABNORMAL HIGH (ref 4.8–5.6)
Mean Plasma Glucose: 177.16 mg/dL

## 2019-10-18 MED ORDER — GLIPIZIDE ER 10 MG PO TB24: 10.0000 mg | ORAL_TABLET | Freq: Two times a day (BID) | ORAL | 1 refills | Status: DC

## 2019-10-18 MED ORDER — SILVER SULFADIAZINE 1 % EX CREA: 1.0000 "application " | TOPICAL_CREAM | Freq: Two times a day (BID) | CUTANEOUS | 2 refills | Status: AC

## 2019-10-18 NOTE — Telephone Encounter (Signed)
Noted  

## 2019-10-18 NOTE — Telephone Encounter (Signed)
Pt called and said that someone had tried to call him, but he states that he has been setup w/ [facility] for his ONO and will have the test Thursday night.

## 2019-10-18 NOTE — Telephone Encounter (Signed)
Noted.  Will close encounter as nothing further is needed.

## 2019-10-18 NOTE — Progress Notes (Signed)
Date:  10/18/2019   Name:  Kyle Roberson   DOB:  21-Nov-1940   MRN:  [id number]   Chief Complaint: Diabetes (Follow up.)  Diabetes He presents for his follow-up diabetic visit. He has type 2 diabetes mellitus. His disease course has been stable. Pertinent negatives for hypoglycemia include no dizziness, headaches or nervousness/anxiousness. Pertinent negatives for diabetes include no chest pain and no fatigue. Current diabetic treatment includes insulin injections and oral agent (monotherapy) (NPH bid and glipizide). He is compliant with treatment all of the time.  Hypertension This is a chronic problem. The problem is controlled. Associated symptoms include shortness of breath. Pertinent negatives include no chest pain, headaches or palpitations. Past treatments include direct vasodilators and diuretics. The current treatment provides moderate improvement.  Hyperlipidemia The problem is controlled. Associated symptoms include shortness of breath. Pertinent negatives include no chest pain. Current antihyperlipidemic treatment includes statins. The current treatment provides significant improvement of lipids.  Essential hypertension (cardiology assessment 09/18/19) Wife concerned about Imdur and rash, Imdur was a new medication Rash did not improve by holding spironolactone on last clinic visit We will hold the Imdur for now monitor blood pressure blood pressure runs high we did add extra hydralazine Will make more available with hydralazine 100 mg 4 times daily Continue to hold spironolactone.  Challenging to use this medication in the setting of renal dysfunction, recent potassium 4.2 off spironolactone  Lab Results  Component Value Date   CREATININE 2.72 (H) 08/11/2019   BUN 45 (H) 08/11/2019   NA 141 08/11/2019   K 4.1 08/11/2019   CL 101 08/11/2019   CO2 25 08/11/2019   Lab Results  Component Value Date   CHOL 137 07/22/2018   HDL 44 07/22/2018   LDLCALC 73 07/22/2018    TRIG 116 07/22/2018   Lab Results  Component Value Date   TSH 1.568 08/02/2018   Lab Results  Component Value Date   HGBA1C 7.2 (H) 07/19/2019   Lab Results  Component Value Date   WBC 8.2 07/28/2019   HGB 10.6 (L) 07/28/2019   HCT 34.5 (L) 07/28/2019   MCV 86.3 07/28/2019   PLT 207 07/28/2019   Lab Results  Component Value Date   ALT 17 07/14/2019   AST 17 07/14/2019   ALKPHOS 56 07/14/2019   BILITOT 0.8 07/14/2019     Review of Systems  Constitutional: Negative for chills, fatigue, fever and unexpected weight change (gained a few pounds recently because he stopped taking diuretic for travel).  HENT: Negative for trouble swallowing.   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  Respiratory: Positive for shortness of breath. Negative for apnea, choking, chest tightness and wheezing.   Cardiovascular: Positive for leg swelling. Negative for chest pain and palpitations.  Gastrointestinal: Negative for constipation and diarrhea.  Genitourinary: Negative for difficulty urinating and scrotal swelling.  Musculoskeletal: Positive for arthralgias and gait problem.  Neurological: Negative for dizziness and headaches.  Psychiatric/Behavioral: Negative for dysphoric mood and sleep disturbance. The patient is not nervous/anxious.     Patient Active Problem List   Diagnosis Date Noted  . Anasarca   . Chronic a-fib ([place])   . Hyperlipidemia associated with type 2 diabetes mellitus ([place])   . Acute on chronic diastolic CHF (congestive heart failure) ([place]) 07/19/2019  . Acute pulmonary edema (HCC)   . Abdominal swelling   . SOB (shortness of breath) 05/07/2019  . Suspect  Acute heart failure ([place]) 05/07/2019  . 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 CKD (chronic kidney disease) stage 4, GFR 15-29 ml/min (HCC) 04/27/2019  . Chronic heart failure with preserved ejection fraction  (HFpEF) ([place]) 04/27/2019  . Morbid obesity ([place]) 04/27/2019  . Lower extremity edema 03/23/2019  . Erectile dysfunction 08/01/2018  . History of colonic diverticulitis 08/01/2018  . 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 Obesity (BMI 30-39.9) 08/01/2018  . Venous insufficiency 08/01/2018  . Persistent atrial fibrillation ([place]) 08/01/2018  . 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 Essential hypertension 07/06/2018  . Type 2 diabetes mellitus with stage 4 chronic kidney disease ([place]) 07/06/2018  . Coronary artery disease involving native coronary artery of native heart without angina pectoris 06/05/2018  . 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[place]) 01/30/2017  . Vitamin D deficiency 01/30/2017  . Benign non-nodular prostatic hyperplasia with lower urinary tract symptoms 09/29/2014  . History of MI (myocardial infarction) 06/27/2014  . Nephrolithiasis 01/04/2012    Allergies  Allergen Reactions  . Atorvastatin Other (See Comments)  . Simvastatin Other (See Comments)    Past Surgical History:  Procedure Laterality Date  . CARDIAC CATHETERIZATION    . CORONARY STENT INTERVENTION    . CYSTOSCOPY    . HERNIA REPAIR      Social History   Tobacco Use  . Smoking status: Former Research scientist (life sciences)  . Smokeless tobacco: Never Used  Vaping Use  . Vaping Use: Never used  Substance Use Topics  . Alcohol use: Not Currently    Alcohol/week: 2.0 standard drinks    Types: 2 Glasses of wine per week  . Drug use: Not Currently     Medication list has been reviewed and updated.  Current Meds  Medication Sig  . apixaban (ELIQUIS) 2.5 MG TABS tablet Take 1 tablet (2.5 mg total) by mouth 2 (two) times daily.  . Ascorbic Acid (VITAMIN C) 1000 MG tablet Take 1,000 mg by mouth daily.  . cholecalciferol (VITAMIN D3) 25 MCG (1000 UT) tablet Take 2,000 Units by mouth daily.   . Flaxseed, Linseed, (FLAX SEED OIL) 1000 MG CAPS Take by mouth.  Marland Kitchen glipiZIDE (GLUCOTROL XL) 10 MG 24 hr tablet Take 10 mg by mouth  2 (two) times daily.  . hydrALAZINE (APRESOLINE) 100 MG tablet Take 1 tablet (100 mg total) by mouth 4 (four) times daily.  . insulin NPH Human (NOVOLIN N) 100 UNIT/ML injection Inject 10-15 Units into the skin daily. Take 10-15 units twice a day after breakfast and before bedtime per Sliding Scale (BS>250: 15 units, BS= 200-250:10 units)  . MAXITROL 3.5-10000-0.1 OINT Place 1 application into the left eye 2 (two) times daily.   . Multiple Vitamins-Minerals (MULTIVITAMIN WITH MINERALS) tablet Take 1 tablet by mouth daily.  . nitroGLYCERIN (NITROLINGUAL) 0.4 MG/SPRAY spray Place 1 spray under the tongue as directed.  . Omega 3 1000 MG CAPS Take 1 capsule by mouth daily.  . rosuvastatin (CRESTOR) 5 MG tablet TAKE 1 TABLET(5 MG) BY MOUTH 3 TIMES A WEEK  . silver sulfADIAZINE (SILVADENE) 1 % cream Apply 1 application topically 2 (two) times daily. To leg wounds  . torsemide (DEMADEX) 20 MG tablet Take 2 tablets (40 mg total) by mouth daily as needed (Take 2 tablets once daily with extra 2 tablets as needed for swelling or shortness of breath).    PHQ 2/9 Scores 10/18/2019 07/19/2019 05/16/2019 04/07/2019  PHQ - 2 Score 0 0 4 2  PHQ- 9 Score 0 2 8 4  GAD 7 : Generalized Anxiety Score 10/18/2019 07/19/2019  Nervous, Anxious, on Edge 0 1  Control/stop worrying 0 0  Worry too much - different things 0 0  Trouble relaxing 0 0  Restless 0 0  Easily annoyed or irritable 0 2  Afraid - awful might happen 0 0  Total GAD 7 Score 0 3  Anxiety Difficulty Not difficult at all Not difficult at all    BP Readings from Last 3 Encounters:  10/18/19 124/78  09/18/19 (!) 144/60  08/11/19 140/68    Physical Exam Vitals and nursing note reviewed.  Constitutional:      General: He is not in acute distress.    Appearance: He is well-developed. He is obese.  HENT:     Head: Normocephalic and atraumatic.  Cardiovascular:     Rate and Rhythm: Normal rate and regular rhythm.  No extrasystoles are present.     Heart sounds: Heart sounds are distant.     Comments: LE edema improved - hyperkeratotic skin without drainage or redness Pulmonary:     Effort: Pulmonary effort is normal. No respiratory distress.     Breath sounds: Decreased breath sounds present. No wheezing or rhonchi.  Abdominal:     General: There is distension.     Palpations: There is no mass.     Tenderness: There is no abdominal tenderness.  Musculoskeletal:     Cervical back: Normal range of motion.     Right lower leg: 2+ Edema present.     Left lower leg: 2+ Edema present.  Lymphadenopathy:     Cervical: No cervical adenopathy.  Skin:    General: Skin is warm and dry.     Findings: No rash.  Neurological:     Mental Status: He is alert and oriented to person, place, and time.  Psychiatric:        Mood and Affect: Mood normal.        Behavior: Behavior normal.     Wt Readings from Last 3 Encounters:  10/18/19 (!) 248 lb (112.5 kg)  09/18/19 241 lb 2 oz (109.4 kg)  08/11/19 235 lb 2 oz (106.7 kg)    BP 124/78   Pulse 67   Ht 5\' 8"  (1.727 m)   Wt (!) 248 lb (112.5 kg)   SpO2 98%   BMI 37.71 kg/m   Assessment and Plan: 1. Type 2 diabetes mellitus with stage 4 chronic kidney disease, with long-term current use of insulin ([place]) Clinically stable by exam and report without s/s of hypoglycemia. DM complicated by CAD and HTN. Tolerating medications - insulin and glipizide-  well without side effects or other concerns. - Hemoglobin V4B - Basic metabolic panel - glipiZIDE (GLUCOTROL XL) 10 MG 24 hr tablet; Take 1 tablet (10 mg total) by mouth 2 (two) times daily.  Dispense: 180 tablet; Refill: 1  2. Essential hypertension Clinically stable exam with well controlled BP on furosemide and hydralazine. Tolerating medications without side effects at this time. Pt to continue current regimen and low sodium diet; benefits of regular exercise as able discussed.  3. Chronic heart failure with preserved ejection fraction  (HFpEF) ([place]) Followed by Cardiology Volume status appears normal today - weight is up a few pounds which he will address with resumption of daily furosemide dose  4. Hyperlipidemia associated with type 2 diabetes mellitus ([place]) Now tolerating Crestor three times per week - Lipid panel  5. Venous insufficiency Continue local care, elevation to LEs No evidence of cellulitis today  Can pursue lymphedema specialist consultation - silver sulfADIAZINE (SILVADENE) 1 % cream; Apply 1 application topically 2 (two) times daily. To leg wounds  Dispense: 50 g; Refill: 2   Partially dictated using Editor, commissioning. Any errors are unintentional.  Halina Maidens, MD [facility]  10/18/2019

## 2019-10-18 NOTE — Telephone Encounter (Signed)
Received call from Romie Minus with [facility].  Romie Minus stated that she has attempted to contact patient 5 times to schedule ONO.  I have also left a message for patient requesting a call back.  Will route to Dr. Patsey Berthold as an Juluis Rainier.

## 2019-10-19 ENCOUNTER — Encounter: Payer: Medicare HMO | Admitting: Occupational Therapy

## 2019-10-20 ENCOUNTER — Encounter: Payer: Medicare HMO | Admitting: Occupational Therapy

## 2019-10-24 ENCOUNTER — Encounter: Payer: Medicare HMO | Admitting: Occupational Therapy

## 2019-10-26 ENCOUNTER — Encounter: Payer: Medicare HMO | Admitting: Occupational Therapy

## 2019-10-27 ENCOUNTER — Encounter: Payer: Medicare HMO | Admitting: Occupational Therapy

## 2019-10-31 ENCOUNTER — Encounter: Payer: Medicare HMO | Admitting: Occupational Therapy

## 2019-11-01 ENCOUNTER — Telehealth: Payer: Self-pay | Admitting: Pulmonary Disease

## 2019-11-01 DIAGNOSIS — R0683 Snoring: Secondary | ICD-10-CM

## 2019-11-01 DIAGNOSIS — I2729 Other secondary pulmonary hypertension: Secondary | ICD-10-CM

## 2019-11-01 NOTE — Telephone Encounter (Signed)
I have spoken to [place] with [employer] and requested that ONO be faxed to ur office.  Patient spouse, [name](DPR) is aware that our office will contact her with one results have been reviewed by Dr. Patsey Berthold.

## 2019-11-01 NOTE — Telephone Encounter (Signed)
ONO results have been placed in Dr. Domingo Dimes folder for review.

## 2019-11-07 NOTE — Telephone Encounter (Addendum)
ATC patient. Unable to leave voicemail due to mailbox being full.

## 2019-11-07 NOTE — Telephone Encounter (Signed)
Patient spouse, [name] (DPR) is aware of results and voiced her understanding. Order has been placed for [facility] for 2L.  Order has been placed for split night.  [name] also requested order for POC- order has been placed.  Nothing further is needed at this time.

## 2019-11-07 NOTE — Telephone Encounter (Signed)
Will route to Dr. Gonzalez to make aware.  

## 2019-11-07 NOTE — Telephone Encounter (Signed)
He will need oxygen at 2 L/min. His breathing pattern was erratic during the test and this is indicative of possible sleep apnea. In lab sleep study split-night.

## 2019-12-01 ENCOUNTER — Encounter: Payer: Self-pay | Admitting: Internal Medicine

## 2019-12-01 NOTE — Telephone Encounter (Signed)
ERROR

## 2019-12-04 ENCOUNTER — Ambulatory Visit: Payer: Medicare HMO

## 2019-12-19 NOTE — Progress Notes (Signed)
Cardiology Office Note  Date:  12/20/2019   ID:  Kyle Roberson, DOB Dec 02, 1940, MRN [id number]  PCP:  Glean Hess, MD   Chief Complaint  Patient presents with  . office visit    3 month F/U; Meds verbally reviewed with patient.    HPI:  Kyle Roberson is a 79 y.o. male with history of  coronary artery disease,  persistent atrial fibrillation,  hypertension,  hyperlipidemia,  type 2 diabetes mellitus,  chronic kidney disease,  GERD, and  hyperparathyroidism,  who presents for follow-up of chronic HFpEF and persistent atrial fibrillation.     Seen in clinic April 2021,  significantly volume overloaded despite escalation of outpatient diuretics.  directly admitted to the hospital and underwent extensive IV diuresis.   Right heart catheterization was discussed, but the patient ultimately declined this. 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 Daughter does everything, including the driving  Sold his house but has until December to move out Last 2 weeks, he has been very active cleaning out house, long days Has minimal weeks of housecleaning Mild swelling, has not been sitting much  takes torsemide 60 daily Took 100 mg yesterday as he had abdominal tightness  He cut back on hydralazine, tried 4 times a day now only takes 3 times a day BP was low  He reports his rash has resolved Imdur and spironolactone previously held out of concern for rash, not sure if this made a difference  Still eats out at restaurants, reports recently at [employer] work reviewed CR 2.1 GFR 29   PMH:   has a past medical history of AKI (acute kidney injury) ([place]) (07/06/2018), CAD (coronary artery disease), Cellulitis of lower extremity (05/31/2019), Diabetes ([place]), GERD (gastroesophageal reflux disease), HLD (hyperlipidemia), and HTN (hypertension).  PSH:    Past Surgical History:  Procedure  Laterality Date  . BLEPHAROPLASTY    . CARDIAC CATHETERIZATION    . CORONARY STENT INTERVENTION    . CYSTOSCOPY    . HERNIA REPAIR      Current Outpatient Medications  Medication Sig Dispense Refill  . apixaban (ELIQUIS) 2.5 MG TABS tablet Take 1 tablet (2.5 mg total) by mouth 2 (two) times daily. 180 tablet 1  . Ascorbic Acid (VITAMIN C) 1000 MG tablet Take 1,000 mg by mouth daily.    . cholecalciferol (VITAMIN D3) 25 MCG (1000 UT) tablet Take 2,000 Units by mouth daily.     . Flaxseed, Linseed, (FLAX SEED OIL) 1000 MG CAPS Take by mouth daily.     Marland Kitchen glipiZIDE (GLUCOTROL XL) 10 MG 24 hr tablet Take 1 tablet (10 mg total) by mouth 2 (two) times daily. 180 tablet 1  . hydrALAZINE (APRESOLINE) 100 MG tablet Take 100 mg by mouth 3 (three) times daily.     . insulin NPH Human (NOVOLIN N) 100 UNIT/ML injection Inject 10-15 Units into the skin daily. Take 10-15 units twice a day after breakfast and before bedtime per Sliding Scale (BS>250: 15 units, BS= 200-250:10 units)    . MAXITROL 3.5-10000-0.1 OINT Place 1 application into the left eye 2 (two) times daily.     . Multiple Vitamins-Minerals (MULTIVITAMIN WITH MINERALS) tablet Take 1 tablet by mouth daily.    . nitroGLYCERIN (NITROLINGUAL) 0.4 MG/SPRAY spray Place 1 spray under the tongue as directed. 12 g 1  . Omega 3 1000 MG CAPS Take 1 capsule by  mouth daily.    . rosuvastatin (CRESTOR) 5 MG tablet TAKE 1 TABLET(5 MG) BY MOUTH 3 TIMES A WEEK 90 tablet 0  . silver sulfADIAZINE (SILVADENE) 1 % cream Apply 1 application topically 2 (two) times daily. To leg wounds 50 g 2  . torsemide (DEMADEX) 20 MG tablet Take 2 tablets (40 mg total) by mouth daily as needed (Take 2 tablets once daily with extra 2 tablets as needed for swelling or shortness of breath). 120 tablet 3  . hydrALAZINE (APRESOLINE) 100 MG tablet Take 1 tablet (100 mg total) by mouth 4 (four) times daily. (Patient taking differently: Take 100 mg by mouth 3 (three) times daily. ) 360  tablet 3   No current facility-administered medications for this visit.     Allergies:   Atorvastatin and Simvastatin   Social History:  The patient  reports that he has quit smoking. He has never used smokeless tobacco. He reports previous alcohol use of about 2.0 standard drinks of alcohol per week. He reports previous drug use.   Family History:   family history includes CAD in his father; Diabetes in his brother; Pancreatic cancer in his mother.    Review of Systems: Review of Systems  Constitutional: Negative.   Respiratory: Positive for shortness of breath.   Cardiovascular: Negative.   Gastrointestinal: Negative.   Musculoskeletal: Negative.        Legs are weak  Neurological: Negative.   Psychiatric/Behavioral: Negative.   All other systems reviewed and are negative.   PHYSICAL EXAM: VS:  BP 140/70 (BP Location: Right Arm, Patient Position: Sitting, Cuff Size: Normal)   Pulse (!) 56   Ht 5\' 8"  (1.727 m)   Wt 240 lb (108.9 kg)   SpO2 95%   BMI 36.49 kg/m  , BMI Body mass index is 36.49 kg/m.  Constitutional:  oriented to person, place, and time. No distress.  In wheelchair Obese HENT:  Head: Grossly normal Eyes:  no discharge. No scleral icterus.  Neck: No JVD, no carotid bruits  Cardiovascular: Regular rate and rhythm, no murmurs appreciated Trace to 1+ pitting lower extremity edema to the mid shins Pulmonary/Chest: Clear to auscultation bilaterally, no wheezes or rails Abdominal: Soft.  no distension.  no tenderness.  Musculoskeletal: Normal range of motion Neurological:  normal muscle tone. Coordination normal. No atrophy Skin: Skin warm and dry Psychiatric: normal affect, pleasant  Recent Labs: 07/14/2019: ALT 17; B Natriuretic Peptide 224.0 07/22/2019: Magnesium 2.3 07/28/2019: Hemoglobin 10.6; Platelets 207 10/18/2019: BUN 42; Creatinine, Ser 2.10; Potassium 3.9; Sodium 139    Lipid Panel Lab Results  Component Value Date   CHOL 123 10/18/2019   HDL  35 (L) 10/18/2019   LDLCALC 67 10/18/2019   TRIG 107 10/18/2019      Wt Readings from Last 3 Encounters:  12/20/19 240 lb (108.9 kg)  10/18/19 (!) 248 lb (112.5 kg)  09/18/19 241 lb 2 oz (109.4 kg)       ASSESSMENT AND PLAN:  Problem List Items Addressed This Visit      Cardiology Problems   Chronic heart failure with preserved ejection fraction (HFpEF) (HCC)   Relevant Medications   hydrALAZINE (APRESOLINE) 100 MG tablet   Essential hypertension   Relevant Medications   hydrALAZINE (APRESOLINE) 100 MG tablet   Coronary artery disease involving native coronary artery of native heart without angina pectoris   Relevant Medications   hydrALAZINE (APRESOLINE) 100 MG tablet   Persistent atrial fibrillation (HCC) - Primary   Relevant Medications  hydrALAZINE (APRESOLINE) 100 MG tablet     Other   Morbid obesity (HCC)   CKD (chronic kidney disease) stage 4, GFR 15-29 ml/min (HCC)     Persistent atrial fibrillation  renally dosed Eliquis with no rate control agents Prior history of bradycardia Relatively asymptomatic though contributing to his heart failure symptoms  Chronic diastolic CHF Taking torsemide 60 daily, extra torsemide as needed for abdominal distention weight gain Some dietary nondiscretion  Morbid obesity Unable to exercise but staying active recently cleaning out his house, will take him weeks  Essential hypertension Blood pressure is well controlled on today's visit. No changes made to the medications. Rash seems to have resolved    Total encounter time more than 25 minutes  Greater than 50% was spent in counseling and coordination of care with the patient    Signed, Esmond Plants, M.D., Ph.D. [place], [place]

## 2019-12-20 ENCOUNTER — Ambulatory Visit (INDEPENDENT_AMBULATORY_CARE_PROVIDER_SITE_OTHER): Payer: Medicare HMO | Admitting: Cardiovascular Disease

## 2019-12-20 ENCOUNTER — Other Ambulatory Visit: Payer: Self-pay

## 2019-12-20 ENCOUNTER — Encounter: Payer: Self-pay | Admitting: Cardiovascular Disease

## 2019-12-20 VITALS — BP 140/70 | HR 56 | Ht 68.0 in | Wt 240.0 lb

## 2019-12-20 DIAGNOSIS — Z23 Encounter for immunization: Secondary | ICD-10-CM

## 2019-12-20 DIAGNOSIS — I4819 Other persistent atrial fibrillation: Secondary | ICD-10-CM | POA: Diagnosis not present

## 2019-12-20 DIAGNOSIS — I10 Essential (primary) hypertension: Secondary | ICD-10-CM

## 2019-12-20 DIAGNOSIS — N184 Chronic kidney disease, stage 4 (severe): Secondary | ICD-10-CM | POA: Diagnosis not present

## 2019-12-20 DIAGNOSIS — I2510 Atherosclerotic heart disease of native coronary artery without angina pectoris: Secondary | ICD-10-CM | POA: Diagnosis not present

## 2019-12-20 DIAGNOSIS — I5032 Chronic diastolic (congestive) heart failure: Secondary | ICD-10-CM

## 2019-12-20 MED ORDER — HYDRALAZINE HCL 100 MG PO TABS: 100.0000 mg | ORAL_TABLET | Freq: Three times a day (TID) | ORAL | 0 refills | Status: DC

## 2019-12-20 NOTE — Patient Instructions (Signed)
Medication Instructions:  No changes  If you need a refill on your cardiac medications before your next appointment, please call your pharmacy.    Lab work: No new labs needed   If you have labs (blood work) drawn today and your tests are completely normal, you will receive your results only by: . MyChart Message (if you have MyChart) OR . A paper copy in the mail If you have any lab test that is abnormal or we need to change your treatment, we will call you to review the results.   Testing/Procedures: No new testing needed   Follow-Up: At [facility], you and your health needs are our priority.  As part of our continuing mission to provide you with exceptional heart care, we have created designated Provider Care Teams.  These Care Teams include your primary Cardiologist (physician) and Advanced Practice Providers (APPs -  Physician Assistants and Nurse Practitioners) who all work together to provide you with the care you need, when you need it.  . You will need a follow up appointment in 6 months  . Providers on your designated Care Team:   . Christopher Berge, NP . Ryan Dunn, PA-C . Jacquelyn Visser, PA-C  Any Other Special Instructions Will Be Listed Below (If Applicable).  COVID-19 Vaccine Information can be found at: [email or web address] For questions related to vaccine distribution or appointments, please email [email or web address] or call [phone].     

## 2020-01-19 ENCOUNTER — Other Ambulatory Visit: Payer: Self-pay | Admitting: Internal Medicine

## 2020-02-27 ENCOUNTER — Ambulatory Visit: Payer: Medicare HMO | Admitting: Internal Medicine

## 2020-03-07 ENCOUNTER — Other Ambulatory Visit: Payer: Self-pay | Admitting: Internal Medicine

## 2020-03-07 DIAGNOSIS — Z794 Long term (current) use of insulin: Secondary | ICD-10-CM

## 2020-03-07 DIAGNOSIS — I2510 Atherosclerotic heart disease of native coronary artery without angina pectoris: Secondary | ICD-10-CM

## 2020-03-07 DIAGNOSIS — N184 Chronic kidney disease, stage 4 (severe): Secondary | ICD-10-CM

## 2020-03-07 MED ORDER — GLIPIZIDE ER 10 MG PO TB24: 10.0000 mg | ORAL_TABLET | Freq: Two times a day (BID) | ORAL | 0 refills | Status: DC

## 2020-03-07 MED ORDER — ROSUVASTATIN CALCIUM 5 MG PO TABS: ORAL_TABLET | ORAL | 0 refills | Status: DC

## 2020-03-07 NOTE — Telephone Encounter (Signed)
Copied from [place] [id number]. Topic: Quick Communication - Rx Refill/Question >> Mar 07, 2020 11:38 AM Leward Quan A wrote: Medication: rosuvastatin (CRESTOR) 5 MG tablet, glipiZIDE (GLUCOTROL XL) 10 MG 24 hr tablet  Has the patient contacted their pharmacy? Yes.   (Agent: If no, request that the patient contact the pharmacy for the refill.) (Agent: If yes, when and what did the pharmacy advise?)  Preferred Pharmacy (with phone number or street name): [id number] - [place], [place] [address]  Phone:  [phone] Fax:  [phone]     Agent: Please be advised that RX refills may take up to 3 business days. We ask that you follow-up with your pharmacy.

## 2020-05-06 ENCOUNTER — Telehealth: Payer: Self-pay

## 2020-05-06 ENCOUNTER — Other Ambulatory Visit: Payer: Self-pay | Admitting: Internal Medicine

## 2020-05-06 DIAGNOSIS — I480 Paroxysmal atrial fibrillation: Secondary | ICD-10-CM

## 2020-05-06 MED ORDER — RIVAROXABAN 15 MG PO TABS: 15.0000 mg | ORAL_TABLET | Freq: Every day | ORAL | 5 refills | Status: DC

## 2020-05-06 NOTE — Telephone Encounter (Signed)
Copied from [place] [id number]. Topic: General - Other >> May 06, 2020  9:29 AM Celene Kras wrote: Reason for CRM: Cyndee Brightly, from [place], calling and is requesting to speak with someone in prior authorizations. She states that she is following up on pts Eliquis and if the office will continue to pursue this request and if they needed to help in any way. Please advise.

## 2020-05-06 NOTE — Telephone Encounter (Signed)
Spoke to [facility] Friday, 05/02/2020. Told her that Eliquis had been denied by insurance. Told [name] that we could send in Xarelto. Pt stated to send it in that she would compare the prices of eliquis and xarelto and pay out of pocket if needed.  KP

## 2020-05-20 ENCOUNTER — Ambulatory Visit: Payer: Medicare HMO | Admitting: Internal Medicine

## 2020-06-10 NOTE — Progress Notes (Signed)
Cardiology Office Note  Date:  06/11/2020   ID:  Rudi Volner, DOB 07-19-1940, MRN [id number]  PCP:  Glean Hess, MD   Chief Complaint  Patient presents with  . Follow-up    6 Months follow up and medications verbally reviewed with patient and wife.     HPI:  Mr. Carnal is a 80 y.o. male with history of  coronary artery disease,  persistent atrial fibrillation,  hypertension,  hyperlipidemia,  type 2 diabetes mellitus,  chronic kidney disease,  GERD, and  hyperparathyroidism,  who presents for follow-up of chronic HFpEF and persistent atrial fibrillation.     In follow-up today, reports worsening leg swelling shortness of breath Some dietary indiscretion at home Reports he has been weighing daily, not adjusting his torsemide based off his weight Per our notes is up 12 pounds from his prior clinic visit Presentation similar to last April 2021 when he had fluid overload requiring hospitalization At that time was discharged on torsemide 60 twice daily but he had cut the dose down to 40 daily and follow-up  On prior office visit was eating at [employer] Daughter does report he has been eating lots of relish among other items  Followed by Dr. 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  directly admitted to the hospital and underwent extensive IV diuresis.   Right heart catheterization was discussed, but the patient ultimately declined this. He was discharged on torsemide 60 twice daily He cut the dose down to 40 daily  Vision gone on  the left eye Vessel occlusion Daughter does everything, including the driving  Prior rash of unclear etiology Imdur and spironolactone previously held out of concern for rash, not sure if this made a difference     PMH:   has a past medical history of AKI (acute kidney injury) ([place]) (07/06/2018), CAD (coronary artery disease), Cellulitis of lower extremity (05/31/2019), Diabetes ([place]), GERD (gastroesophageal reflux disease), HLD (hyperlipidemia), and HTN (hypertension).  PSH:    Past Surgical History:  Procedure Laterality Date  . BLEPHAROPLASTY    . CARDIAC CATHETERIZATION    . CORONARY STENT INTERVENTION    . CYSTOSCOPY    . HERNIA REPAIR      Current Outpatient Medications  Medication Sig Dispense Refill  . apixaban (ELIQUIS) 5 MG TABS tablet Take 5 mg by mouth 2 (two) times daily.    . Coenzyme Q10-Vitamin E (QUNOL ULTRA COQ10 PO) Take by mouth.    . Ascorbic Acid (VITAMIN C) 1000 MG tablet Take 1,000 mg by mouth daily.    . cholecalciferol (VITAMIN D3) 25 MCG (1000 UT) tablet Take 2,000 Units by mouth daily.     . Flaxseed, Linseed, (FLAX SEED OIL) 1000 MG CAPS Take by mouth daily.     Marland Kitchen glipiZIDE (GLUCOTROL XL) 10 MG 24 hr tablet Take 1 tablet (10 mg total) by mouth 2 (two) times daily. 180 tablet 0  . hydrALAZINE (APRESOLINE) 100 MG tablet Take 1 tablet (100 mg total) by mouth 3 (three) times daily. 270 tablet 0  . insulin NPH Human (NOVOLIN N) 100 UNIT/ML  injection Inject 10-15 Units into the skin daily. Take 10-15 units twice a day after breakfast and before bedtime per Sliding Scale (BS>250: 15 units, BS= 200-250:10 units)    . MAXITROL 3.5-10000-0.1 OINT Place 1 application into the left eye 2 (two) times daily.     . Multiple Vitamins-Minerals (MULTIVITAMIN WITH MINERALS) tablet Take 1 tablet by mouth daily.    . nitroGLYCERIN (NITROLINGUAL) 0.4 MG/SPRAY spray Place 1 spray under the tongue as directed. 12 g 1  . Omega 3 1000 MG CAPS Take 1 capsule by mouth daily.    .  rosuvastatin (CRESTOR) 5 MG tablet TAKE 1 TABLET(5 MG) BY MOUTH 3 TIMES A WEEK 90 tablet 0  . silver sulfADIAZINE (SILVADENE) 1 % cream Apply 1 application topically 2 (two) times daily. To leg wounds 50 g 2  . torsemide (DEMADEX) 20 MG tablet Take 2 tablets (40 mg total) by mouth daily as needed (Take 2 tablets once daily with extra 2 tablets as needed for swelling or shortness of breath). 120 tablet 3   No current facility-administered medications for this visit.     Allergies:   Atorvastatin and Simvastatin   Social History:  The patient  reports that he has quit smoking. He has never used smokeless tobacco. He reports previous alcohol use of about 2.0 standard drinks of alcohol per week. He reports previous drug use.   Family History:   family history includes CAD in his father; Diabetes in his brother; Pancreatic cancer in his mother.    Review of Systems: Review of Systems  Constitutional: Negative.        Weight gain  HENT: Negative.   Respiratory: Positive for shortness of breath.   Cardiovascular: Positive for leg swelling.  Gastrointestinal: Negative.   Musculoskeletal: Negative.        Legs are weak  Neurological: Negative.   Psychiatric/Behavioral: Negative.   All other systems reviewed and are negative.   PHYSICAL EXAM: VS:  BP 130/70 (BP Location: Left Arm, Patient Position: Sitting, Cuff Size: Normal)   Pulse (!) 50   Wt 252 lb (114.3 kg)   SpO2 92%   BMI 38.32 kg/m  , BMI Body mass index is 38.32 kg/m.  Constitutional:  oriented to person, place, and time. No distress.   Mild shortness of breath Obese HENT:  Head: Grossly normal Eyes:  no discharge. No scleral icterus.  Neck: Unable to estimate JVD, no carotid bruits  Cardiovascular: Irregularly irregular no murmurs appreciated 2+ pitting lower extremity edema to the thighs Pulmonary/Chest: Clear to auscultation bilaterally, no wheezes or rails Abdominal: Soft.  Moderately distended,  nontender Musculoskeletal: Normal range of motion Neurological:  normal muscle tone. Coordination normal. No atrophy Skin: Skin warm and dry Psychiatric: normal affect, pleasant  Recent Labs: 07/14/2019: ALT 17; B Natriuretic Peptide 224.0 07/22/2019: Magnesium 2.3 07/28/2019: Hemoglobin 10.6; Platelets 207 10/18/2019: BUN 42; Creatinine, Ser 2.10; Potassium 3.9; Sodium 139    Lipid Panel Lab Results  Component Value Date   CHOL 123 10/18/2019   HDL 35 (L) 10/18/2019   LDLCALC 67 10/18/2019   TRIG 107 10/18/2019    Wt Readings from Last 3 Encounters:  06/11/20 252 lb (114.3 kg)  12/20/19 240 lb (108.9 kg)  10/18/19 (!) 248 lb (112.5 kg)     ASSESSMENT AND PLAN:  Problem List Items Addressed This Visit      Cardiology Problems   Chronic heart failure with preserved ejection fraction (HFpEF) (HCC)   Relevant Medications   apixaban (ELIQUIS)  5 MG TABS tablet   Essential hypertension   Relevant Medications   apixaban (ELIQUIS) 5 MG TABS tablet   Coronary artery disease involving native coronary artery of native heart without angina pectoris   Relevant Medications   apixaban (ELIQUIS) 5 MG TABS tablet   Persistent atrial fibrillation (HCC) - Primary   Relevant Medications   apixaban (ELIQUIS) 5 MG TABS tablet     Other   Morbid obesity (HCC)   CKD (chronic kidney disease) stage 4, GFR 15-29 ml/min (HCC)     Persistent atrial fibrillation Slow ventricular rate, not on beta-blockers or calcium channel blockers Renally dosed Eliquis Likely contributing to his CHF symptoms Plan as below  Chronic diastolic CHF Likely some dietary and medication noncompliance Family concerned about using excess torsemide, has high fluid intake We initially recommended torsemide 60 twice daily, weight is up 12 pounds with massive leg edema Family prefers to take metolazone with torsemide in the morning We have settled on metolazone two-point 5 in the AM with torsemide 60 holding metolazone  for weight less than 246 pounds Suspect baseline weight 240 Recommend if no improvement we may need afternoon torsemide Suggested potassium 20 for every dose of metolazone Has close follow-up with primary care in 1 week, will consider lab work at that time.   Morbid obesity We have encouraged continued exercise, careful diet management in an effort to lose weight.  Essential hypertension Blood pressure is well controlled on today's visit. No changes made to the medications.    Total encounter time more than 45 minutes  Greater than 50% was spent in counseling and coordination of care with the patient    Signed, Esmond Plants, M.D., Ph.D. [place], [place]

## 2020-06-11 ENCOUNTER — Encounter: Payer: Self-pay | Admitting: Cardiovascular Disease

## 2020-06-11 ENCOUNTER — Ambulatory Visit (INDEPENDENT_AMBULATORY_CARE_PROVIDER_SITE_OTHER): Payer: Medicare HMO | Admitting: Cardiovascular Disease

## 2020-06-11 ENCOUNTER — Other Ambulatory Visit: Payer: Self-pay

## 2020-06-11 ENCOUNTER — Other Ambulatory Visit: Payer: Self-pay | Admitting: Cardiovascular Disease

## 2020-06-11 VITALS — BP 130/70 | HR 50 | Wt 252.0 lb

## 2020-06-11 DIAGNOSIS — I10 Essential (primary) hypertension: Secondary | ICD-10-CM

## 2020-06-11 DIAGNOSIS — N184 Chronic kidney disease, stage 4 (severe): Secondary | ICD-10-CM | POA: Diagnosis not present

## 2020-06-11 DIAGNOSIS — I4819 Other persistent atrial fibrillation: Secondary | ICD-10-CM | POA: Diagnosis not present

## 2020-06-11 DIAGNOSIS — I5032 Chronic diastolic (congestive) heart failure: Secondary | ICD-10-CM

## 2020-06-11 DIAGNOSIS — I2510 Atherosclerotic heart disease of native coronary artery without angina pectoris: Secondary | ICD-10-CM

## 2020-06-11 MED ORDER — POTASSIUM CHLORIDE CRYS ER 20 MEQ PO TBCR: 20.0000 meq | EXTENDED_RELEASE_TABLET | Freq: Every day | ORAL | 3 refills | Status: DC | PRN

## 2020-06-11 MED ORDER — METOLAZONE 2.5 MG PO TABS: 2.5000 mg | ORAL_TABLET | Freq: Every day | ORAL | 1 refills | Status: DC | PRN

## 2020-06-11 MED ORDER — HYDRALAZINE HCL 100 MG PO TABS: 100.0000 mg | ORAL_TABLET | Freq: Three times a day (TID) | ORAL | 3 refills | Status: DC

## 2020-06-11 MED ORDER — APIXABAN 5 MG PO TABS: 5.0000 mg | ORAL_TABLET | Freq: Two times a day (BID) | ORAL | 3 refills | Status: DC

## 2020-06-11 MED ORDER — TORSEMIDE 20 MG PO TABS: 60.0000 mg | ORAL_TABLET | Freq: Every day | ORAL | 3 refills | Status: DC

## 2020-06-11 NOTE — Patient Instructions (Addendum)
Medication Instructions:  Weight is up 12 pounds Metolazone 2.5 daily as needed 30 min later take torsemide 60 mg daily  If needed, take torsemide 60 twice a day  Potassium 20 meq when you take the metolazone   If you need a refill on your cardiac medications before your next appointment, please call your pharmacy.    Lab work: [facility] Dr. Morey [facility]   If you have labs (blood work) drawn today and your tests are completely normal, you will receive your results only by: Marland Kitchen MyChart Message (if you have MyChart) OR . A paper copy in the mail If you have any lab test that is abnormal or we need to change your treatment, we will call you to review the results.   Testing/Procedures: No new testing needed   Follow-Up: At [facility], you and your health needs are our priority.  As part of our continuing mission to provide you with exceptional heart care, we have created designated Provider Care Teams.  These Care Teams include your primary Cardiologist (physician) and Advanced Practice Providers (APPs -  Physician Assistants and Nurse Practitioners) who all work together to provide you with the care you need, when you need it.  . You will need a follow up appointment in end of April  . Providers on your designated Care Team:   . Murray Hodgkins, NP . Christell Faith, PA-C . Marrianne Mood, PA-C  Any Other Special Instructions Will Be Listed Below (If Applicable).  COVID-19 Vaccine Information can be found at: [employer]Sc[employer].co.uk For questions related to vaccine distribution or appointments, please email [email or web address] or call [phone].

## 2020-06-18 ENCOUNTER — Telehealth: Payer: Self-pay

## 2020-06-18 ENCOUNTER — Other Ambulatory Visit
Admission: RE | Admit: 2020-06-18 | Discharge: 2020-06-18 | Disposition: A | Discharge status: Home / Self Care | Payer: Medicare HMO | Attending: Internal Medicine | Admitting: Internal Medicine

## 2020-06-18 ENCOUNTER — Telehealth: Payer: Self-pay | Admitting: Pulmonary Disease

## 2020-06-18 ENCOUNTER — Encounter: Payer: Self-pay | Admitting: Internal Medicine

## 2020-06-18 ENCOUNTER — Ambulatory Visit: Payer: Medicare HMO | Admitting: Internal Medicine

## 2020-06-18 ENCOUNTER — Telehealth: Payer: Self-pay | Admitting: Cardiovascular Disease

## 2020-06-18 ENCOUNTER — Other Ambulatory Visit: Payer: Self-pay

## 2020-06-18 VITALS — BP 130/66 | HR 46 | Temp 97.6°F | Ht 68.0 in | Wt 235.0 lb

## 2020-06-18 DIAGNOSIS — J9611 Chronic respiratory failure with hypoxia: Secondary | ICD-10-CM

## 2020-06-18 DIAGNOSIS — I5033 Acute on chronic diastolic (congestive) heart failure: Secondary | ICD-10-CM | POA: Insufficient documentation

## 2020-06-18 DIAGNOSIS — I10 Essential (primary) hypertension: Secondary | ICD-10-CM

## 2020-06-18 DIAGNOSIS — E118 Type 2 diabetes mellitus with unspecified complications: Secondary | ICD-10-CM | POA: Insufficient documentation

## 2020-06-18 DIAGNOSIS — E1169 Type 2 diabetes mellitus with other specified complication: Secondary | ICD-10-CM

## 2020-06-18 DIAGNOSIS — I25118 Atherosclerotic heart disease of native coronary 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: Secondary | ICD-10-CM

## 2020-06-18 DIAGNOSIS — N2581 Secondary hyperparathyroidism of renal origin: Secondary | ICD-10-CM

## 2020-06-18 DIAGNOSIS — E785 Hyperlipidemia, unspecified: Secondary | ICD-10-CM

## 2020-06-18 LAB — COMPREHENSIVE METABOLIC PANEL
ALT: 18 U/L (ref 0–44)
AST: 21 U/L (ref 15–41)
Albumin: 3.8 g/dL (ref 3.5–5.0)
Alkaline Phosphatase: 64 U/L (ref 38–126)
Anion gap: 8 (ref 5–15)
BUN: 55 mg/dL — ABNORMAL HIGH (ref 8–23)
CO2: 32 mmol/L (ref 22–32)
Calcium: 9.8 mg/dL (ref 8.9–10.3)
Chloride: 99 mmol/L (ref 98–111)
Creatinine, Ser: 2.37 mg/dL — ABNORMAL HIGH (ref 0.61–1.24)
GFR, Estimated: 27 mL/min — ABNORMAL LOW (ref >60)
Glucose, Bld: 124 mg/dL — ABNORMAL HIGH (ref 70–99)
Potassium: 3.7 mmol/L (ref 3.5–5.1)
Sodium: 139 mmol/L (ref 135–145)
Total Bilirubin: 0.5 mg/dL (ref 0.3–1.2)
Total Protein: 7.1 g/dL (ref 6.5–8.1)

## 2020-06-18 LAB — HEMOGLOBIN A1C
Hgb A1c MFr Bld: 7.6 % — ABNORMAL HIGH (ref 4.8–5.6)
Mean Plasma Glucose: 171.42 mg/dL

## 2020-06-18 LAB — BRAIN NATRIURETIC PEPTIDE: B Natriuretic Peptide: 332.4 pg/mL — ABNORMAL HIGH (ref 0.0–100.0)

## 2020-06-18 NOTE — Telephone Encounter (Signed)
Patient's daughter, Kyle Roberson(DPR) is aware of below recommendations. She voiced her understanding and had no further questions.  Order has been placed to [employer] to d/c oxygen. Nothing further needed at this time.

## 2020-06-18 NOTE — Telephone Encounter (Signed)
Copied from [place] [phone]. Topic: General - Other >> Jun 18, 2020  4:10 PM Erick Blinks wrote: Gerald Stabs from the [facility] called and reported that the information requested from [facility] must be requested via Medical Records Phone: [phone] Medical records (Phone and Fax) Fax: [phone]

## 2020-06-18 NOTE — Telephone Encounter (Signed)
Pt c/o medication issue:  1. Name of Medication: eliquis   2. How are you currently taking this medication (dosage and times per day)? 2.5 mg bid  3. Are you having a reaction (difficulty breathing--STAT)? n/a  4. What is your medication issue? Sent to pharmacy as 5 mg instead of 2.'5mg'$ . Patient wanting to know if this was an error or a change that was not discussed

## 2020-06-18 NOTE — Progress Notes (Signed)
Date:  06/18/2020   Name:  Kyle Roberson   DOB:  [birth year]   MRN:  [id number]   Chief Complaint: Diabetes and Congestive Heart Failure  Diabetes He presents for his follow-up diabetic visit. He has type 2 diabetes mellitus. His disease course has been stable. Pertinent negatives for hypoglycemia include no dizziness, headaches or nervousness/anxiousness. Pertinent negatives for diabetes include no chest pain and no fatigue. Current diabetic treatment includes insulin injections (glipizide). He is compliant with treatment most of the time. His weight is increasing steadily. He is following a generally healthy (trying to cut back on portion size) diet. He never participates in exercise. He monitors blood glucose at home 3-4 x per day. His breakfast blood glucose is taken between 7-8 am. His breakfast blood glucose range is generally 110-130 mg/dl. An ACE inhibitor/angiotensin II receptor blocker is not being taken.  Hypertension This is a chronic problem. The problem is controlled. Associated symptoms include shortness of breath. Pertinent negatives include no chest pain, headaches or palpitations. Past treatments include diuretics and direct vasodilators. The current treatment provides moderate improvement. Hypertensive end-organ damage includes kidney disease and heart failure.  CHF - seen 5 days ago by Cardiology with massive edema and weight up 12 lbs.  His medication was changed to metolazone 2.5 mg in am with torsemide 60 mg.  Aiming for weight of 246 or less.  He has lost 14 lbs per his home scales and is feeling better.  Lab Results  Component Value Date   CREATININE 2.10 (H) 10/18/2019   BUN 42 (H) 10/18/2019   NA 139 10/18/2019   K 3.9 10/18/2019   CL 105 10/18/2019   CO2 24 10/18/2019   Lab Results  Component Value Date   CHOL 123 10/18/2019   HDL 35 (L) 10/18/2019   LDLCALC 67 10/18/2019   TRIG 107 10/18/2019   CHOLHDL 3.5 10/18/2019   Lab Results  Component Value  Date   TSH 1.568 08/02/2018   Lab Results  Component Value Date   HGBA1C 7.8 (H) 10/18/2019   Lab Results  Component Value Date   WBC 8.2 07/28/2019   HGB 10.6 (L) 07/28/2019   HCT 34.5 (L) 07/28/2019   MCV 86.3 07/28/2019   PLT 207 07/28/2019   Lab Results  Component Value Date   ALT 17 07/14/2019   AST 17 07/14/2019   ALKPHOS 56 07/14/2019   BILITOT 0.8 07/14/2019     Review of Systems  Constitutional: Negative for chills, fatigue and fever.  Respiratory: Positive for shortness of breath. Negative for cough, chest tightness and wheezing.   Cardiovascular: Positive for leg swelling. Negative for chest pain and palpitations.  Gastrointestinal: Positive for abdominal distention. Negative for abdominal pain.  Neurological: Negative for dizziness and headaches.  Psychiatric/Behavioral: Negative for dysphoric mood and sleep disturbance. The patient is not nervous/anxious.     Patient Active Problem List   Diagnosis Date Noted  . Anasarca   . Chronic a-fib ([place])   . Hyperlipidemia associated with type 2 diabetes mellitus ([place])   . Acute on chronic diastolic CHF (congestive heart failure) ([place]) 07/19/2019  . Abdominal swelling   . SOB (shortness of breath) 05/07/2019  . 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 CKD (chronic kidney disease) stage 4, GFR 15-29 ml/min (HCC) 04/27/2019  . Chronic heart failure with preserved ejection fraction (HFpEF) ([place]) 04/27/2019  . Morbid obesity ([place]) 04/27/2019  . Lower extremity edema 03/23/2019  . Erectile dysfunction 08/01/2018  .  History of colonic diverticulitis 08/01/2018  . 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 Obesity (BMI 30-39.9) 08/01/2018  . Venous insufficiency 08/01/2018  . Persistent atrial fibrillation ([place]) 08/01/2018  . 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 Essential hypertension 07/06/2018  . Type II diabetes mellitus with complication ([place]) [id number]  . Coronary artery disease involving  native coronary artery of native heart without angina pectoris 06/05/2018  . 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[place]) 01/30/2017  . Vitamin D deficiency 01/30/2017  . Benign non-nodular prostatic hyperplasia with lower urinary tract symptoms 09/29/2014  . History of MI (myocardial infarction) 06/27/2014  . Nephrolithiasis 01/04/2012    Allergies  Allergen Reactions  . Atorvastatin Other (See Comments)  . Simvastatin Other (See Comments)    Past Surgical History:  Procedure Laterality Date  . BLEPHAROPLASTY    . CARDIAC CATHETERIZATION    . CORONARY STENT INTERVENTION    . CYSTOSCOPY    . HERNIA REPAIR      Social History   Tobacco Use  . Smoking status: Former Research scientist (life sciences)  . Smokeless tobacco: Never Used  Vaping Use  . Vaping Use: Never used  Substance Use Topics  . Alcohol use: Not Currently    Alcohol/week: 2.0 standard drinks    Types: 2 Glasses of wine per week  . Drug use: Not Currently     Medication list has been reviewed and updated.  Current Meds  Medication Sig  . apixaban (ELIQUIS) 5 MG TABS tablet Take 1 tablet (5 mg total) by mouth 2 (two) times daily.  . Ascorbic Acid (VITAMIN C) 1000 MG tablet Take 1,000 mg by mouth daily.  . cholecalciferol (VITAMIN D3) 25 MCG (1000 UT) tablet Take 2,000 Units by mouth daily.   Marland Kitchen glipiZIDE (GLUCOTROL XL) 10 MG 24 hr tablet Take 1 tablet (10 mg total) by mouth 2 (two) times daily.  . hydrALAZINE (APRESOLINE) 100 MG tablet Take 1 tablet (100 mg total) by mouth 3 (three) times daily.  . insulin NPH Human (NOVOLIN N) 100 UNIT/ML injection Inject 10-15 Units into the skin daily. Take 10-15 units twice a day after breakfast and before bedtime per Sliding Scale (BS>250: 15 units, BS= 200-250:10 units)  . MAXITROL 3.5-10000-0.1 OINT Place 1 application into the left eye 2 (two) times daily.   . metolazone (ZAROXOLYN) 2.5 MG tablet TAKE 1 TABLET BY MOUTH EVERY DAY AS NEEDED FOR [id number]  . Multiple Vitamins-Minerals (MULTIVITAMIN WITH  MINERALS) tablet Take 1 tablet by mouth daily.  . nitroGLYCERIN (NITROLINGUAL) 0.4 MG/SPRAY spray Place 1 spray under the tongue as directed.  . Omega 3 1000 MG CAPS Take 1 capsule by mouth daily.  . potassium chloride SA (KLOR-CON) 20 MEQ tablet Take 1 tablet (20 mEq total) by mouth daily as needed.  . rosuvastatin (CRESTOR) 5 MG tablet TAKE 1 TABLET(5 MG) BY MOUTH 3 TIMES A WEEK  . silver sulfADIAZINE (SILVADENE) 1 % cream Apply 1 application topically 2 (two) times daily. To leg wounds (Patient taking differently: Apply 1 application topically as needed. To leg wounds)  . torsemide (DEMADEX) 20 MG tablet Take 3 tablets (60 mg total) by mouth daily.  Marland Kitchen UNABLE TO FIND Med Name: qunol tumeric extra str. For joints and inflammation  . [DISCONTINUED] Coenzyme Q10-Vitamin E (QUNOL ULTRA COQ10 PO) Take by mouth.    PHQ 2/9 Scores 06/18/2020 10/18/2019 07/19/2019 05/16/2019  PHQ - 2 Score 0 0 0 4  PHQ- 9 Score 0 0 2 8    GAD 7 : Generalized Anxiety Score 06/18/2020 10/18/2019 07/19/2019  Nervous, Anxious, on Edge 0 0 1  Control/stop worrying 0 0 0  Worry too much - different things 0 0 0  Trouble relaxing 0 0 0  Restless 0 0 0  Easily annoyed or irritable 0 0 2  Afraid - awful might happen 0 0 0  Total GAD 7 Score 0 0 3  Anxiety Difficulty - Not difficult at all Not difficult at all    BP Readings from Last 3 Encounters:  06/18/20 130/66  06/11/20 130/70  12/20/19 140/70    Physical Exam Vitals and nursing note reviewed.  Constitutional:      General: He is not in acute distress.    Appearance: He is well-developed. He is obese.  HENT:     Head: Normocephalic and atraumatic.  Cardiovascular:     Rate and Rhythm: Rhythm irregular.     Pulses: Normal pulses.  Pulmonary:     Effort: Pulmonary effort is normal. No respiratory distress.     Breath sounds: No wheezing or rhonchi.  Abdominal:     General: There is distension.     Palpations: Abdomen is soft.     Tenderness: There is no  abdominal tenderness.  Musculoskeletal:     Cervical back: Normal range of motion.     Right lower leg: Edema present.     Left lower leg: Edema present.  Lymphadenopathy:     Cervical: No cervical adenopathy.  Skin:    General: Skin is warm and dry.     Findings: Erythema (lower legs) present. No rash.  Neurological:     Mental Status: He is alert and oriented to person, place, and time.  Psychiatric:        Mood and Affect: Mood normal.        Behavior: Behavior normal.     Wt Readings from Last 3 Encounters:  06/18/20 235 lb (106.6 kg)  06/11/20 252 lb (114.3 kg)  12/20/19 240 lb (108.9 kg)    BP 130/66   Pulse (!) 46   Temp 97.6 F (36.4 C) (Oral)   Ht '5\' 8"'$  (1.727 m)   Wt 235 lb (106.6 kg)   SpO2 97%   BMI 35.73 kg/m   Assessment and Plan: 1. Type II diabetes mellitus with complication (HCC) Clinically stable by exam and report. Occasional  s/s of hypoglycemia which need to be monitored. Not likely a candidate for Farxiga due to low GFR.  Patient/wife will ask Nephrology as next visit. DM complicated by HTN, CAD, lipids. Tolerating medications well. - Hemoglobin A1c  2. Acute on chronic diastolic CHF (congestive heart failure) (HCC) Improving diuresis - hold metolazone and monitor weight - resume for 1-3 days as needed for weight increase - Comprehensive metabolic panel - Brain natriuretic peptide; Future  3. Hyperlipidemia associated with type 2 diabetes mellitus ([place]) On high intensity statin therapy  4. Essential hypertension Clinically stable exam with well controlled BP on hydralazine and torsemide. Tolerating medications without side effects at this time. Pt to continue current regimen and low sodium diet.  5. 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[name]) Being followed by Nephrology  6. Atherosclerotic heart disease of native coronary 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 ([place]) Stable chronic sx   Partially dictated using Editor, commissioning. Any  errors are unintentional.  Halina Maidens, MD [facility]  06/18/2020

## 2020-06-18 NOTE — Telephone Encounter (Signed)
Called and spoke to patient daughter, [name](DPR). [name] is requesting an order to d/c oxygen. Patient has not used oxygen in a year.   Dr. Patsey Berthold, please advise. Thanks

## 2020-06-18 NOTE — Telephone Encounter (Signed)
He never followed up from his initial visit.  He does have pulmonary hypertension and it is ill advised that he discontinue oxygen but being that he has not been using it it will be discontinued but I will have to be specified [place].

## 2020-06-19 NOTE — Telephone Encounter (Signed)
Faxed request to medical records.  KP

## 2020-06-20 NOTE — Addendum Note (Signed)
Addended by: Ronaldo Miyamoto on: 06/20/2020 07:56 AM   Modules accepted: Orders

## 2020-06-21 NOTE — Telephone Encounter (Signed)
Per TG's note on ov 06/11/20, it is noted "Renally dosed Eliquis", but it looks like the wrong dosage of 5 mg was sent in. Will await approval from [name] before reverting back to 2.5 mg BID.

## 2020-06-21 NOTE — Telephone Encounter (Signed)
Reviewed the patient's chart. He was seen in clinic on 06/11/20 with [name]. MD sent in a refill for Eliquis 5 mg BID to the pharmacy.  The patient is: Age: 80 Weight: 106.6 kg Creatinine: 2.37 (2/29/22)  Eliquis 2.5 mg BID should be the correct dose for this patient. Will forward to MD/ CVRR clinic to verfify.   It looks like he has previously been on Eliquis 2.5 mg BID, but his intake med list at his 06/11/20 appointment stated Eliquis 5 mg BID.

## 2020-06-23 ENCOUNTER — Other Ambulatory Visit: Payer: Self-pay | Admitting: Cardiovascular Disease

## 2020-06-23 MED ORDER — APIXABAN 2.5 MG PO TABS: 2.5000 mg | ORAL_TABLET | Freq: Two times a day (BID) | ORAL | 1 refills | Status: DC

## 2020-06-23 NOTE — Telephone Encounter (Signed)
I sent in the correct mg which is 2.5 Can we call pharmacy to confirm cancellation of the 5 mg  Thx TG

## 2020-06-24 NOTE — Telephone Encounter (Signed)
Called and spoke with pharmacist with [facility], they have both 5 mg and 2.5 mg script for Eliquis, they will close out the Eliquis 5 mg at this time and pt will continue with the Eliquis 2.5 mg BID. Next refill is April per pharmacist.

## 2020-06-25 IMAGING — US US ABDOMEN LIMITED
1 series · 8 of 8 positions shown · non-contrast
Comparison: None.

CLINICAL DATA: Evaluate for possible ascites, abdominal distension

EXAM:
LIMITED ABDOMEN ULTRASOUND FOR ASCITES
TECHNIQUE: Limited ultrasound survey for ascites was performed in all four
abdominal quadrants.

[Series 1: us abdomen limited · 0.26mm/px · 8 of 8 slices shown]
[im 1/8]
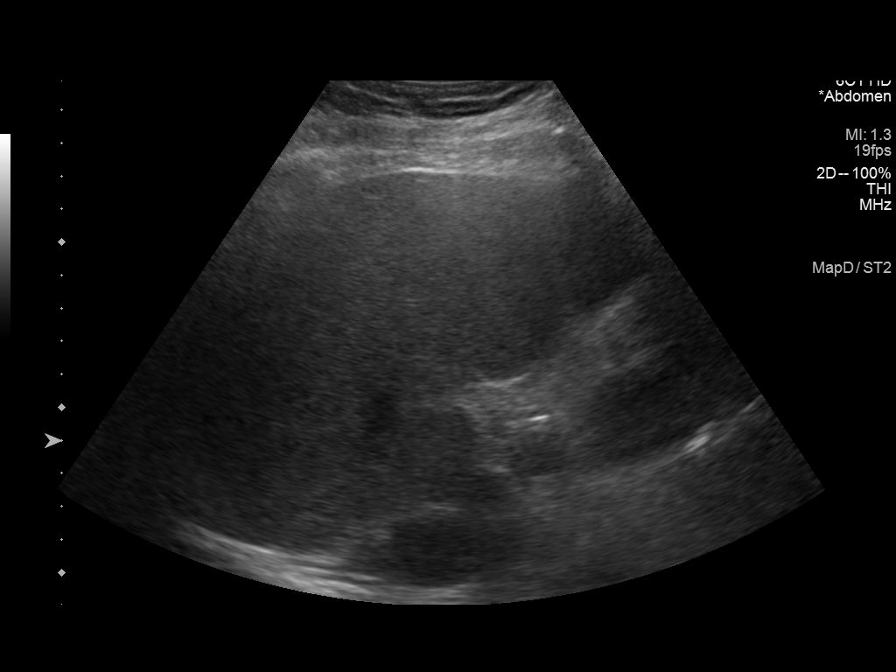
[im 2/8]
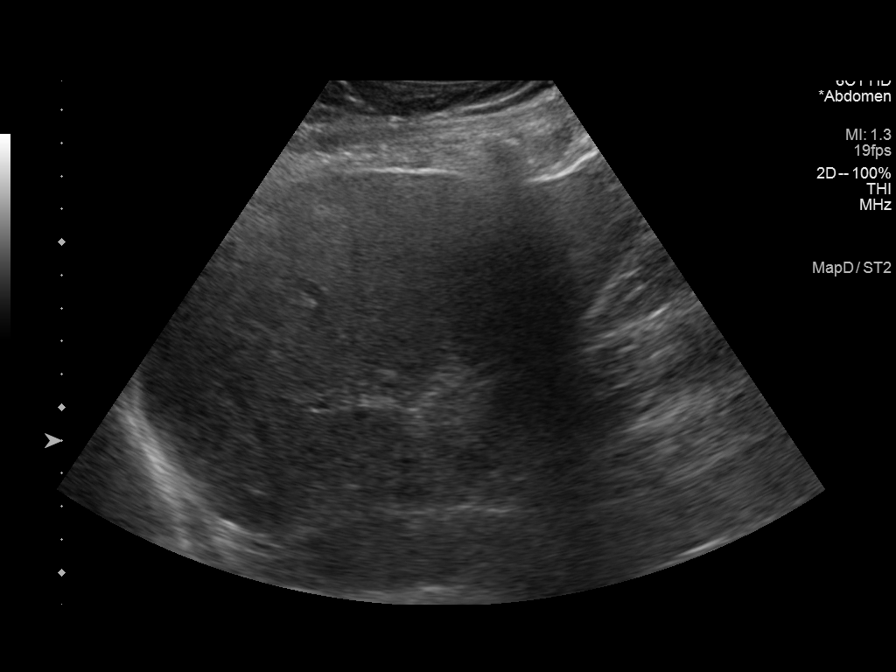
[im 3/8]
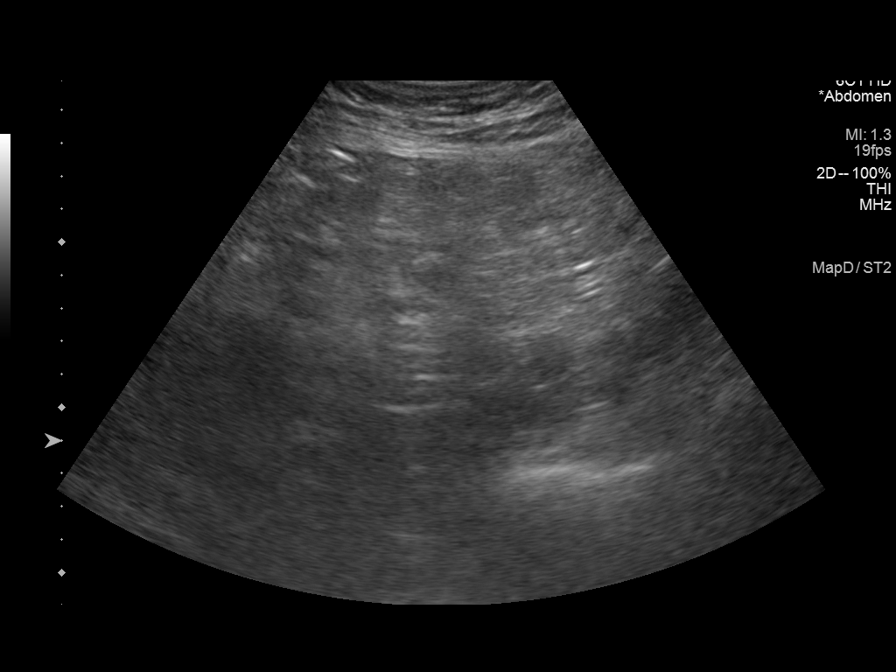
[im 4/8]
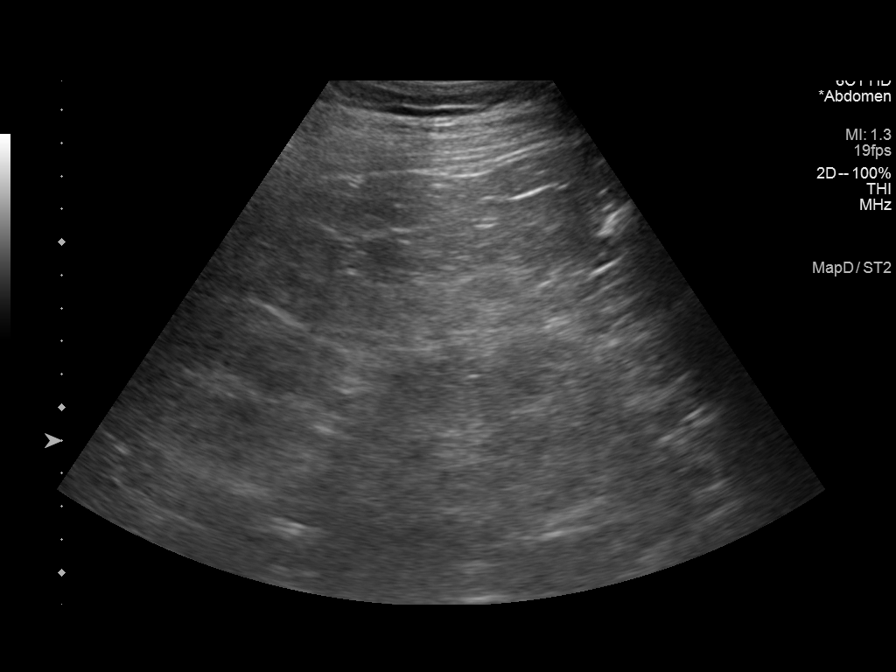
[im 5/8]
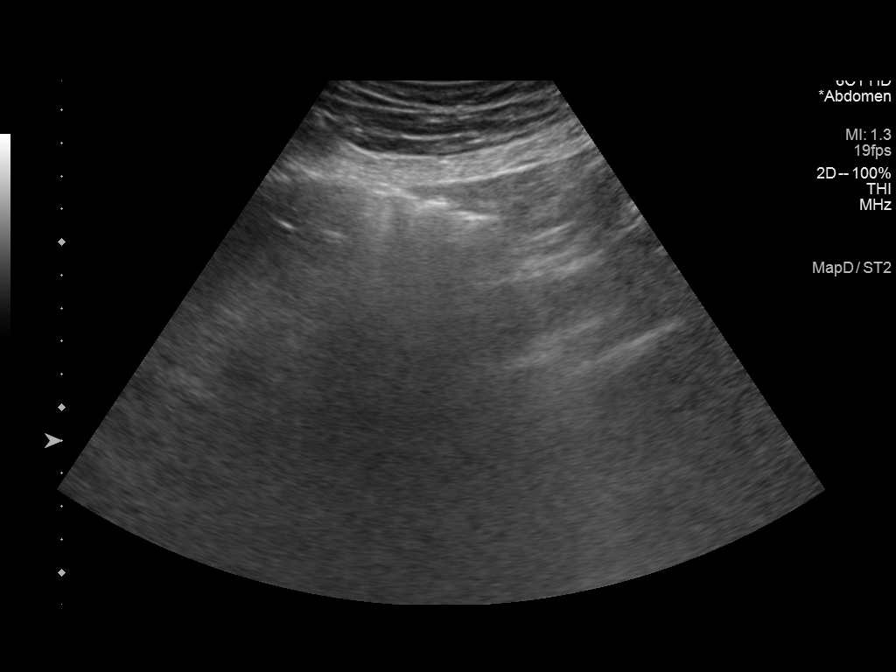
[im 6/8]
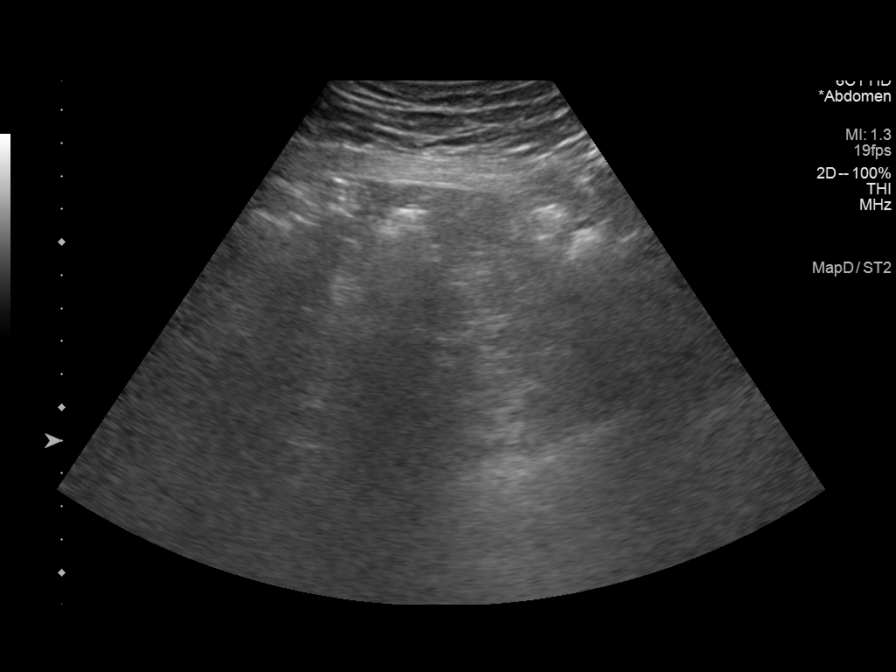
[im 7/8]
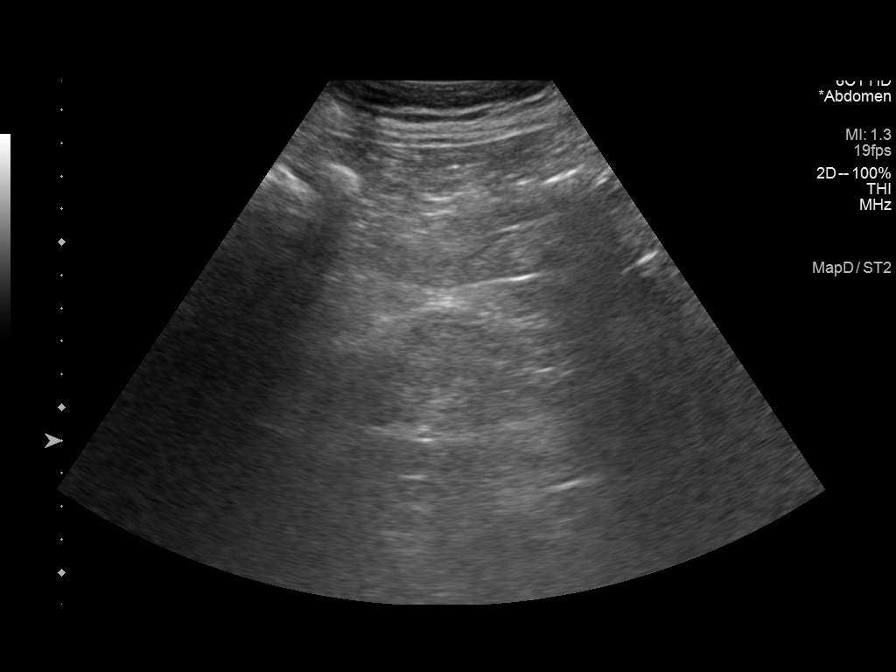
[im 8/8]
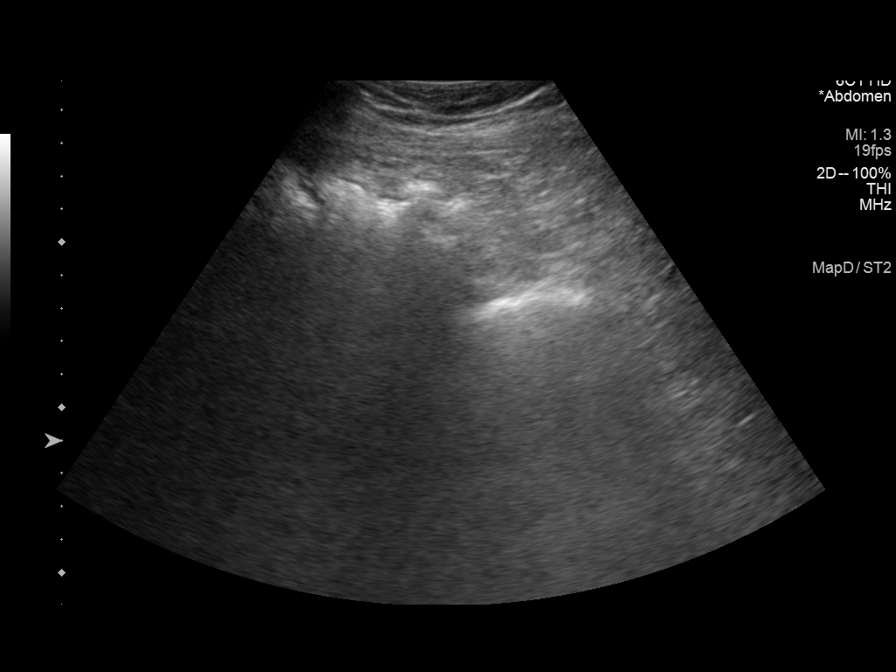

[8 of 8 positions shown; findings below may reference images not displayed]

FINDINGS: Evaluation of all 4 quadrants of the abdomen shows no evidence of
ascites.
IMPRESSION: No ascites identified.

## 2020-07-16 ENCOUNTER — Other Ambulatory Visit: Payer: Self-pay

## 2020-07-16 ENCOUNTER — Encounter: Payer: Self-pay | Admitting: Cardiovascular Disease

## 2020-07-16 ENCOUNTER — Ambulatory Visit (INDEPENDENT_AMBULATORY_CARE_PROVIDER_SITE_OTHER): Payer: Medicare HMO | Admitting: Cardiovascular Disease

## 2020-07-16 VITALS — BP 130/60 | HR 42 | Ht 68.0 in | Wt 230.1 lb

## 2020-07-16 DIAGNOSIS — I25118 Atherosclerotic heart disease of native coronary 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: Secondary | ICD-10-CM | POA: Diagnosis not present

## 2020-07-16 DIAGNOSIS — I5032 Chronic diastolic (congestive) heart failure: Secondary | ICD-10-CM

## 2020-07-16 DIAGNOSIS — I10 Essential (primary) hypertension: Secondary | ICD-10-CM

## 2020-07-16 NOTE — Patient Instructions (Addendum)
Goal weight 220 pounds  Medication Instructions:  No changes  If you need a refill on your cardiac medications before your next appointment, please call your pharmacy.    Lab work: BMP today  LABS WILL APPEAR ON MYCHART, ABNORMAL RESULTS WILL BE CALLED  Testing/Procedures: No new testing needed   Follow-Up:  . You will need a follow up appointment in 3 months  . Providers on your designated Care Team:   . Murray Hodgkins, NP . Christell Faith, PA-C . Marrianne Mood, PA-C  COVID-19 Vaccine Information can be found at: [email or web address].uk For questions related to vaccine distribution or appointments, please email [email or web address] or call [phone].

## 2020-07-16 NOTE — Progress Notes (Signed)
Cardiology Office Note  Date:  07/16/2020   ID:  Kyle Roberson, DOB 04-13-1940, MRN [id number]  PCP:  Kyle Hess, MD   Chief Complaint  Patient presents with  . 1 month follow up     Patient c/o some swelling in feet. Medications reviewed by the patient verbally.     HPI:  Kyle Roberson is a 80 y.o. male with history of  coronary artery disease,  persistent atrial fibrillation,  hypertension,  hyperlipidemia,  type 2 diabetes mellitus,  chronic kidney disease,  GERD, and  hyperparathyroidism,  who presents for follow-up of chronic HFpEF and persistent atrial fibrillation.     On prior clinic visit March 2022 had massive fluid overload Compliance with his torsemide 40 recommended with occasional metolazone use On today's visit, weight down 22 pounds  Weight at home 226 weight toda 230 pounds Initially started out with metolazone 3 days in a row now taking metolazone once a week Taking torsemide 40 daily  Reports overall feeling well with no complaints, blood pressure stable Occasional lightheadedness, etiology unclear  Diet is improved, used to eat lots of [employer] Limited mobility  EKG personally reviewed by myself on todays visit Atrial fibrillation ventricular rate 42 bpm  Followed by Kyle Roberson CR 2.34, BUN 47, 02/2020 Up from 2.32, BUN 37  in 08/2019  Other past medical history reviewed  April 2021,  significantly volume overloaded despite escalation of outpatient diuretics.  directly admitted to the hospital and underwent extensive IV diuresis.   Right heart catheterization was discussed, but the patient ultimately declined this. 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  PSH:    Past Surgical History:  Procedure Laterality Date  . BLEPHAROPLASTY    . CARDIAC CATHETERIZATION    . CORONARY STENT INTERVENTION    . CYSTOSCOPY    . HERNIA REPAIR      Current Outpatient Medications  Medication Sig Dispense Refill  . apixaban (ELIQUIS) 2.5 MG TABS tablet Take 1 tablet (2.5 mg total) by mouth 2 (two) times daily. 180 tablet 1  . Ascorbic Acid (VITAMIN C) 1000 MG tablet Take 1,000 mg by mouth daily.    . cholecalciferol (VITAMIN D3) 25 MCG (1000 UT) tablet Take 2,000 Units by mouth daily.     Marland Kitchen glipiZIDE (GLUCOTROL XL) 10 MG 24 hr tablet Take 1 tablet (10 mg total) by mouth 2 (two) times daily. 180 tablet 0  . hydrALAZINE (APRESOLINE) 100 MG tablet Take 1 tablet (100 mg total) by mouth 3 (three) times daily. 270 tablet 3  . insulin NPH Human (NOVOLIN N) 100 UNIT/ML injection Inject 10-15 Units into the skin daily. Take 10-15 units twice a day after breakfast and before bedtime per Sliding Scale (BS>250: 15 units, BS= 200-250:10 units)    . MAXITROL 3.5-10000-0.1 OINT Place 1 application into the left eye 2 (two) times daily.     . metolazone (ZAROXOLYN) 2.5 MG tablet TAKE 1 TABLET BY MOUTH EVERY DAY AS NEEDED FOR [id number]  tablet 1  . Multiple Vitamins-Minerals (MULTIVITAMIN WITH MINERALS) tablet Take 1 tablet by mouth daily.    . nitroGLYCERIN (NITROLINGUAL) 0.4 MG/SPRAY spray Place 1 spray under the tongue as directed. 12 g 1  . Omega 3 1000 MG CAPS Take 1 capsule by mouth daily.    . potassium chloride SA (KLOR-CON) 20 MEQ tablet Take 1 tablet (20 mEq total) by mouth daily as needed. 90 tablet 3  . rosuvastatin (CRESTOR) 5 MG tablet TAKE 1 TABLET(5 MG) BY MOUTH 3 TIMES A WEEK 90 tablet 0  . silver sulfADIAZINE  (SILVADENE) 1 % cream Apply 1 application topically 2 (two) times daily. To leg wounds (Patient taking differently: Apply 1 application topically as needed. To leg wounds) 50 g 2  . torsemide (DEMADEX) 20 MG tablet Take 3 tablets (60 mg total) by mouth daily. 360 tablet 3  . UNABLE TO FIND Med Name: qunol tumeric extra str. For joints and inflammation     No current facility-administered medications for this visit.     Allergies:   Atorvastatin and Simvastatin   Social History:  The patient  reports that he has quit smoking. He has never used smokeless tobacco. He reports previous alcohol use of about 2.0 standard drinks of alcohol per week. He reports previous drug use.   Family History:   family history includes CAD in his father; Diabetes in his brother; Pancreatic cancer in his mother.    Review of Systems: Review of Systems  Constitutional: Negative.        Weight gain  HENT: Negative.   Respiratory: Positive for shortness of breath.   Cardiovascular: Positive for leg swelling.  Gastrointestinal: Negative.   Musculoskeletal: Negative.        Legs are weak  Neurological: Negative.   Psychiatric/Behavioral: Negative.   All other systems reviewed and are negative.   PHYSICAL EXAM: VS:  BP 130/60 (BP Location: Left Arm, Patient Position: Sitting, Cuff Size: Normal)   Pulse (!) 42   Ht '5\' 8"'$  (1.727 m)   Wt 230 lb 2 oz (104.4 kg)   SpO2 98%   BMI 34.99 kg/m  , BMI Body mass index is 34.99 kg/m.  Constitutional:  oriented to person, place, and time. No distress.  HENT:  Head: Grossly normal Eyes:  no discharge. No scleral icterus.  Neck: No JVD, no carotid bruits  Cardiovascular: Regular rate and rhythm, no murmurs appreciated Still with woody edema below the knees bilaterally 2+ Pulmonary/Chest: Clear to auscultation bilaterally, no wheezes or rails Abdominal: Soft.  no distension.  no tenderness.  Musculoskeletal: Normal range of motion Neurological:  normal muscle  tone. Coordination normal. No atrophy Skin: Skin warm and dry Psychiatric: normal affect, pleasant  Recent Labs: 07/22/2019: Magnesium 2.3 07/28/2019: Hemoglobin 10.6; Platelets 207 06/18/2020: ALT 18; B Natriuretic Peptide 332.4; BUN 55; Creatinine, Ser 2.37; Potassium 3.7; Sodium 139    Lipid Panel Lab Results  Component Value Date   CHOL 123 10/18/2019   HDL 35 (L) 10/18/2019   LDLCALC 67 10/18/2019   TRIG 107 10/18/2019    Wt Readings from Last 3 Encounters:  07/16/20 230 lb 2 oz (104.4 kg)  06/18/20 235 lb (106.6 kg)  06/11/20 252 lb (114.3 kg)     ASSESSMENT AND PLAN:  Problem List Items Addressed This Visit      Cardiology Problems   Atherosclerotic heart disease of native coronary 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 ([place])   Relevant Orders   EKG 12-Lead  Basic metabolic panel   Chronic heart failure with preserved ejection fraction (HFpEF) (HCC)   Relevant Orders   EKG [id number]   Basic metabolic panel   Essential hypertension - Primary   Relevant Orders   EKG [id number]   Basic metabolic panel     Persistent atrial fibrillation Slow ventricular rate, not on beta-blockers or calcium channel blockers Renally dosed Eliquis Referral to EP offered, he has declined at this time, rate in the low 40s, reports he is asymptomatic Unable to exclude that this is contributing to his CHF symptoms Certainly recommended if he has any orthostasis symptoms call us, a ZIO monitor was also offered he has declined at this time  Chronic diastolic CHF Dramatic improvement in his symptoms, Recommend he stay on torsemide 40 daily with metolazone once a week, lab work performed today Wife recently held torsemide over the weekend.  Recommend that he still has significant fluid as he has 2+ pitting edema that is woody below the knees bilaterally Current weight 226, goal weight likely  220, this was discussed with family  Morbid obesity We have encouraged continued exercise,  careful diet management in an effort to lose weight.  Essential hypertension Blood pressure is well controlled on today's visit. No changes made to the medications.   Total encounter time more than 35 minutes  Greater than 50% was spent in counseling and coordination of care with the patient    Signed, Esmond Plants, M.D., Ph.D. [place], [place]

## 2020-07-17 LAB — BASIC METABOLIC PANEL
BUN/Creatinine Ratio: 26 — ABNORMAL HIGH (ref 10–24)
BUN: 59 mg/dL — ABNORMAL HIGH (ref 8–27)
CO2: 28 mmol/L (ref 20–29)
Calcium: 9.7 mg/dL (ref 8.6–10.2)
Chloride: 97 mmol/L (ref 96–106)
Creatinine, Ser: 2.24 mg/dL — ABNORMAL HIGH (ref 0.76–1.27)
Glucose: 182 mg/dL — ABNORMAL HIGH (ref 65–99)
Potassium: 4 mmol/L (ref 3.5–5.2)
Sodium: 140 mmol/L (ref 134–144)
eGFR: 29 mL/min/{1.73_m2} — ABNORMAL LOW (ref >59)

## 2020-07-18 ENCOUNTER — Other Ambulatory Visit: Payer: Self-pay | Admitting: Internal Medicine

## 2020-07-18 DIAGNOSIS — Z794 Long term (current) use of insulin: Secondary | ICD-10-CM

## 2020-07-18 DIAGNOSIS — E1122 Type 2 diabetes mellitus with diabetic chronic kidney disease: Secondary | ICD-10-CM

## 2020-07-24 ENCOUNTER — Telehealth: Payer: Self-pay

## 2020-07-24 NOTE — Progress Notes (Signed)
Pt's lab results mailed to pt, pt is not utilizing his MyChart account to see results   

## 2020-07-24 NOTE — Telephone Encounter (Signed)
Pt's lab results mailed to pt, pt is not utilizing his MyChart account to see results   

## 2020-08-13 IMAGING — US US RENAL
1 series · 14 of 25 positions shown · non-contrast
Comparison: CT abdomen and pelvis January 02, 2019

CLINICAL DATA: Chronic kidney disease

EXAM:
RENAL / URINARY TRACT ULTRASOUND COMPLETE

[Series 1: us renal · 0.24mm/px · 14 of 36 slices shown]
[im 1/36]
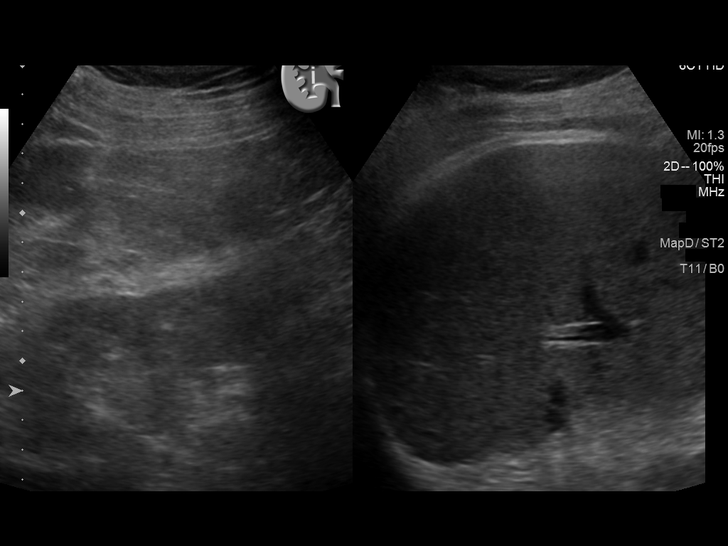
[im 3/36]
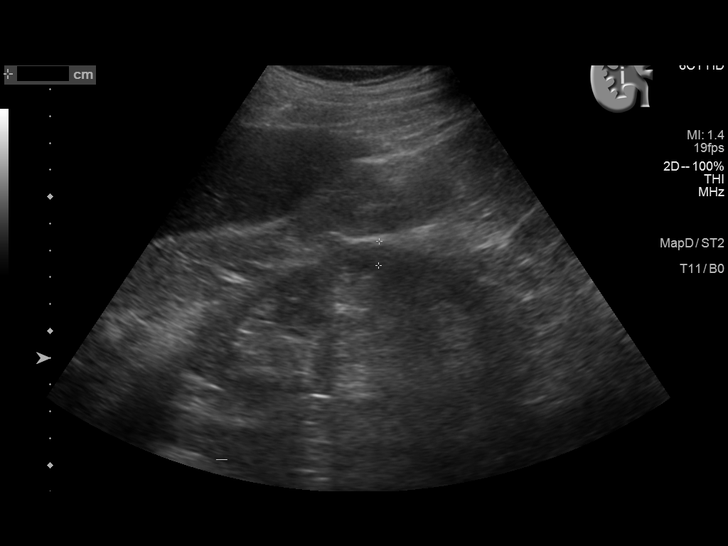
[im 6/36]
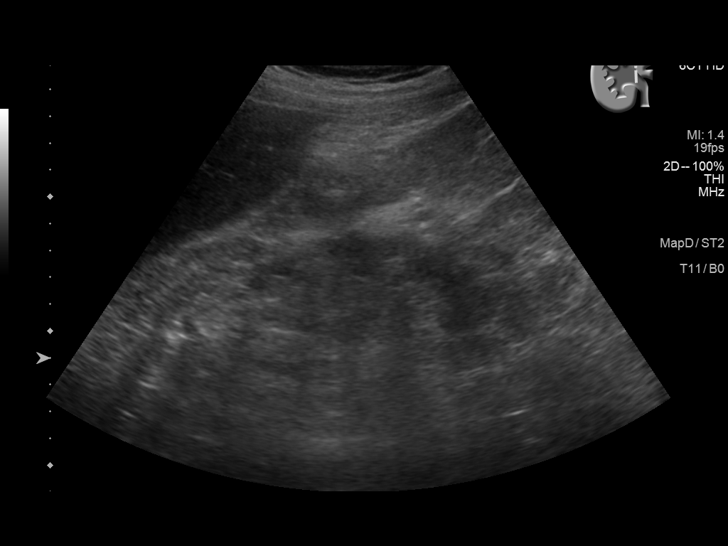
[im 9/36]
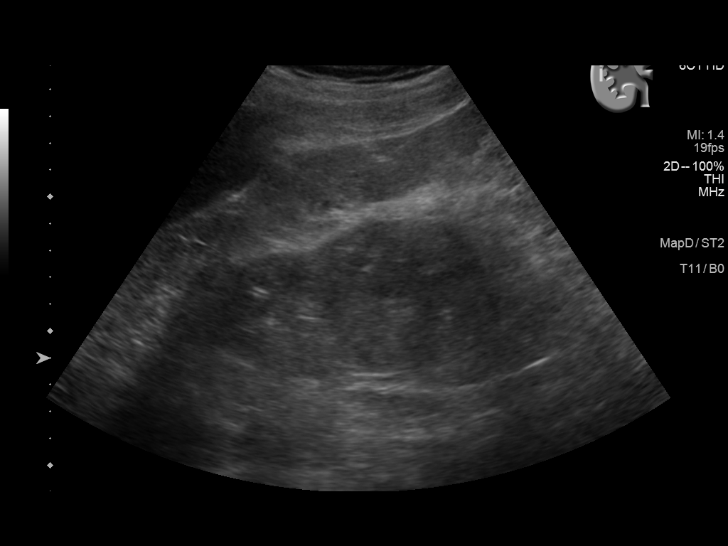
[im 12/36]
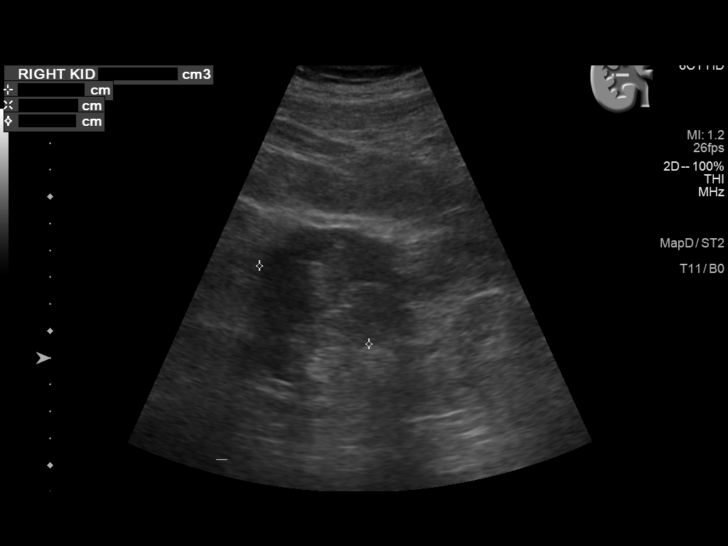
[im 14/36]
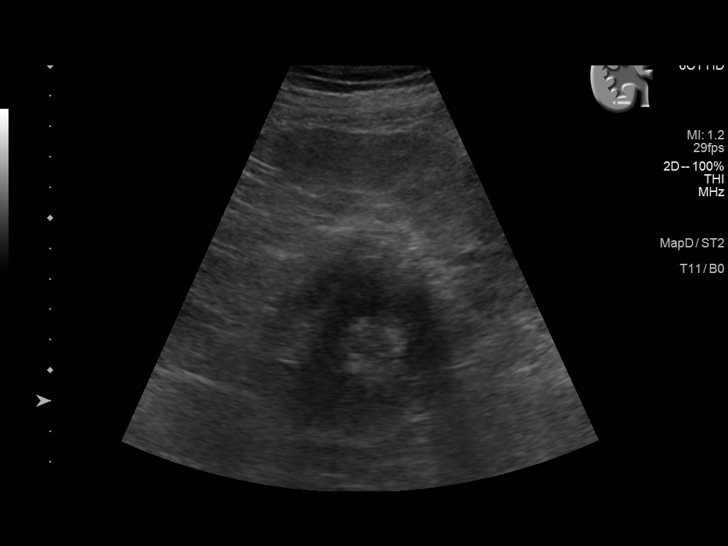
[im 17/36]
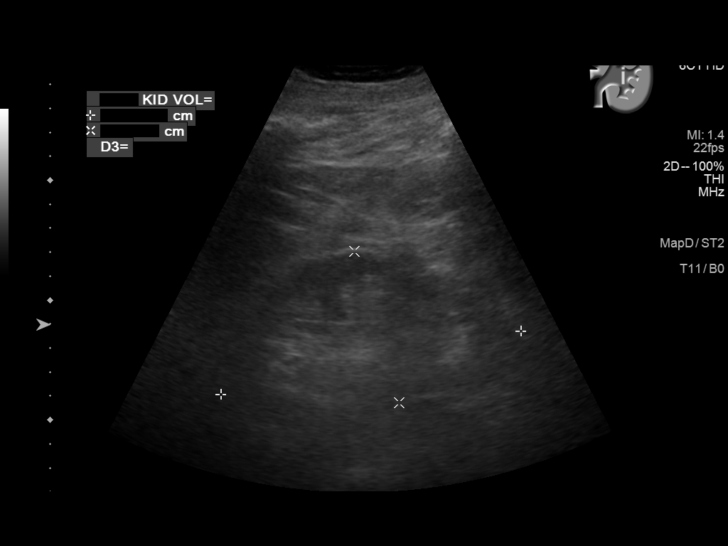
[im 19/36]
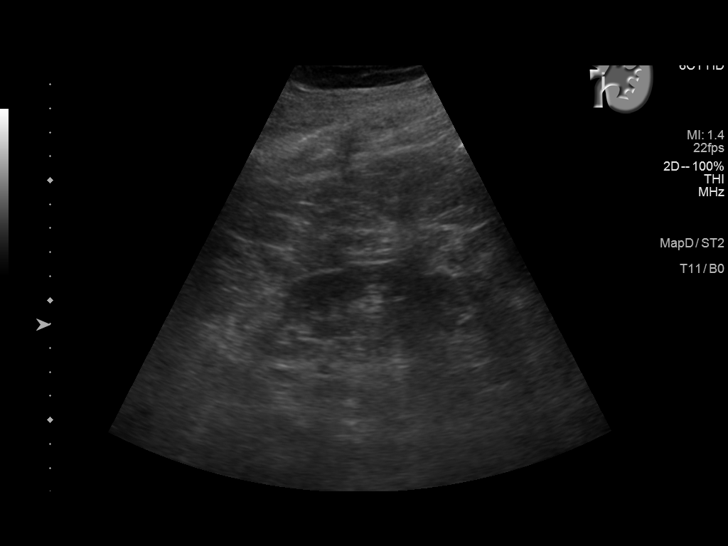
[im 22/36]
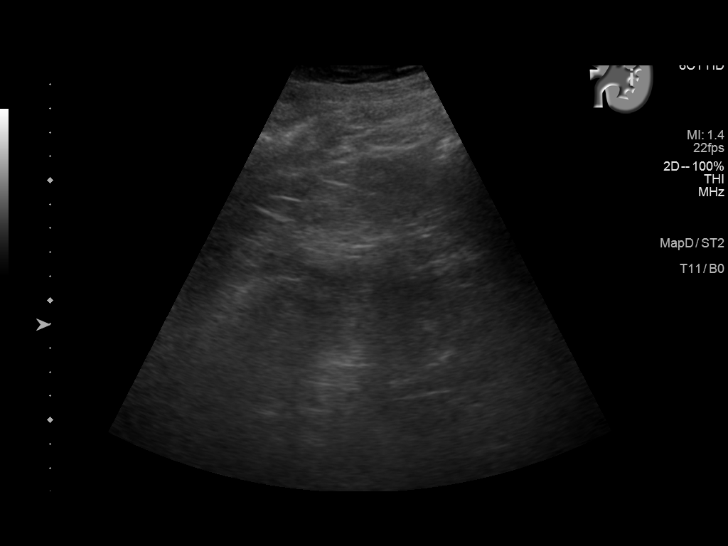
[im 24/36]
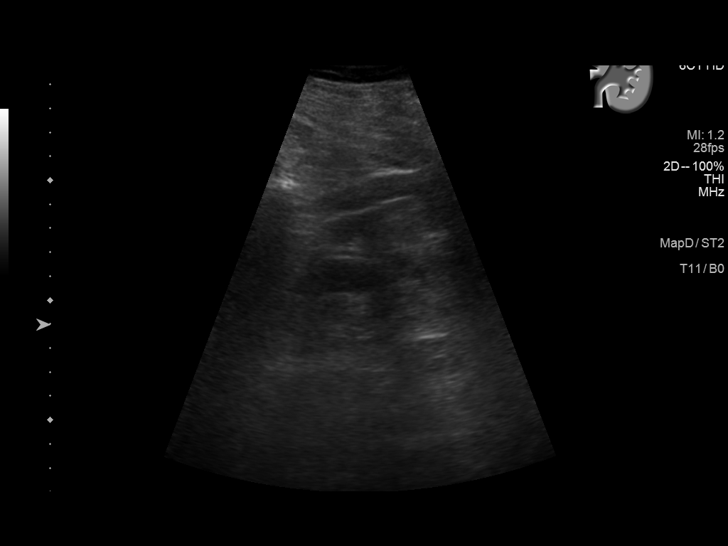
[im 27/36]
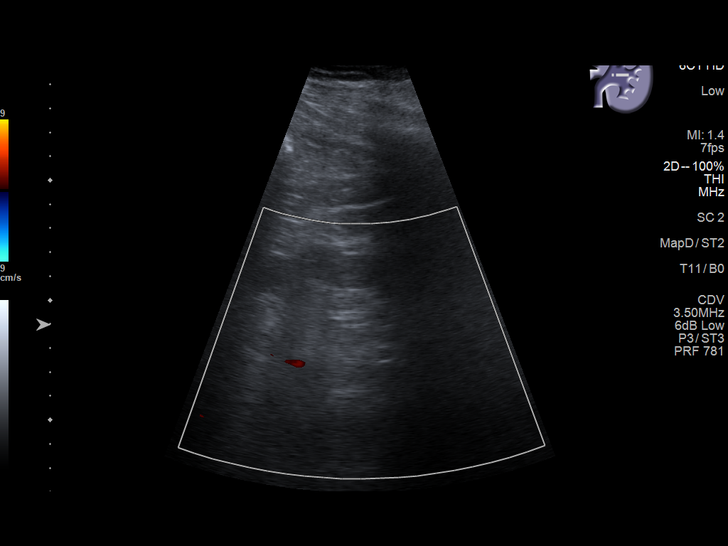
[im 30/36]
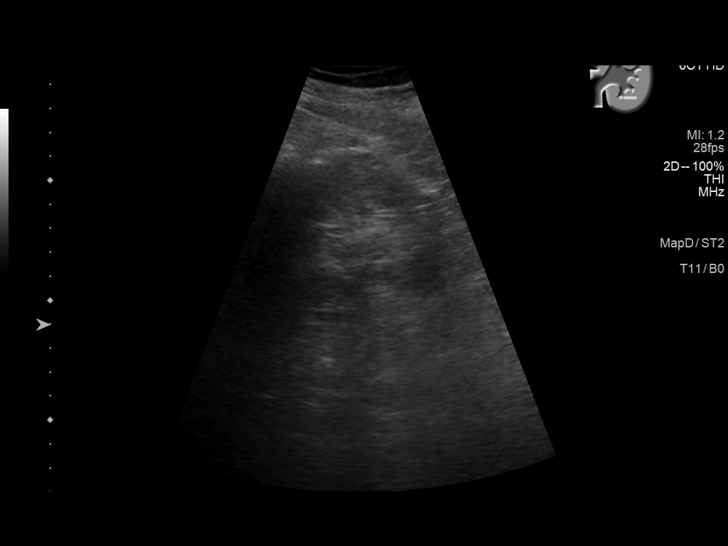
[im 33/36]
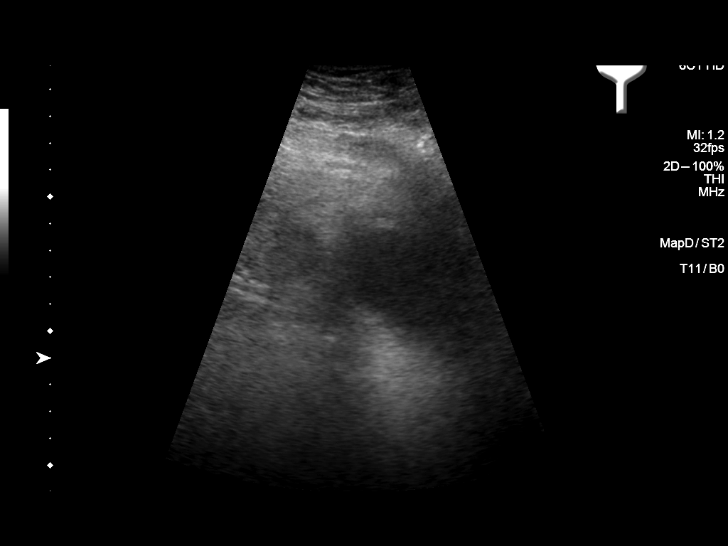
[im 36/36]
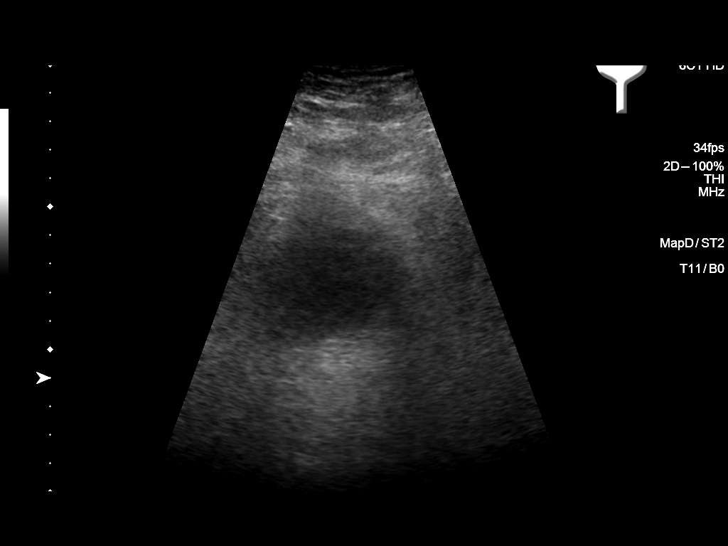

[14 of 25 positions shown; findings below may reference images not displayed]

FINDINGS: Right Kidney:

Renal measurements: 12.5 x 6.1 x 5.0 cm = volume: 200.8 mL .
Echogenicity within normal limits. There is renal cortical thinning.
No mass, perinephric fluid, or hydronephrosis visualized. No
sonographically demonstrable calculus or ureterectasis.

Left Kidney:

Renal measurements: 12.8 x 6.6 x 4.6 cm = volume: 203.4 mL.
Echogenicity within normal limits. There is renal cortical thinning.
No mass, perinephric fluid, or hydronephrosis visualized. No
sonographically demonstrable calculus or ureterectasis.

Bladder:

Appears normal for degree of bladder distention.

Other:

None.
IMPRESSION: Renal cortical thinning, a finding that may be indicative of a
degree of medical renal disease. Renal echogenicity normal
bilaterally. No obstructing focus in either kidney.

## 2020-09-03 ENCOUNTER — Telehealth: Payer: Self-pay | Admitting: Cardiovascular Disease

## 2020-09-03 NOTE — Telephone Encounter (Signed)
Contacted [name] regarding drug interactions with OTC supplements and prescribed medications. [name] wanted to know if there were any drug interactions with Coenzyme Q10 and the patient's prescribed medications. Informed [name] that there was no identifiable drug interactions with this supplement and the patient's current medications. [name] verbalized understanding.   Additionally, [name] raised concern regarding the safety of hydralazine in patients with chronic kidney disease. Reports reading hydralazine can damage the kidneys and cause the kidney arteries to burst and wants to know if there is a safer blood pressure medication the patient can take for BP management instead of hydralazine. Informed [name] that hydralazine is one of the safer BP medications used in patients with chronic kidney disease and even in patients on dialysis as it is not metabolized in the kidneys. Informed [name] hydralazine is a very common medication used in patients with CKD for BP management. [name] reports upcoming appointment with nephrologist on 6/27 and will discuss with at that visit.

## 2020-09-03 NOTE — Telephone Encounter (Signed)
Kyle Roberson calling for patient needs advice regarding supplements allowed to be taken with medication

## 2020-10-20 ENCOUNTER — Other Ambulatory Visit: Payer: Self-pay | Admitting: Internal Medicine

## 2020-10-20 DIAGNOSIS — Z794 Long term (current) use of insulin: Secondary | ICD-10-CM

## 2020-10-20 DIAGNOSIS — N184 Chronic kidney disease, stage 4 (severe): Secondary | ICD-10-CM

## 2020-10-20 DIAGNOSIS — E1122 Type 2 diabetes mellitus with diabetic chronic kidney disease: Secondary | ICD-10-CM

## 2020-10-20 DIAGNOSIS — I2510 Atherosclerotic heart disease of native coronary artery without angina pectoris: Secondary | ICD-10-CM

## 2020-10-20 NOTE — Telephone Encounter (Signed)
Requested Prescriptions  Pending Prescriptions Disp Refills  . glipiZIDE (GLUCOTROL XL) 10 MG 24 hr tablet [Pharmacy Med Name: GLIPIZIDE ER '10MG'$  TABLETS] 180 tablet 0    Sig: TAKE 1 TABLET(10 MG) BY MOUTH TWICE DAILY     Endocrinology:  Diabetes - Sulfonylureas Passed - 10/20/2020 11:15 AM      Passed - HBA1C is between 0 and 7.9 and within 180 days    Hemoglobin A1C  Date Value Ref Range Status  07/22/2018 10.0  Final   Hgb A1c MFr Bld  Date Value Ref Range Status  06/18/2020 7.6 (H) 4.8 - 5.6 % Final    Comment:    (NOTE) Pre diabetes:          5.7%-6.4%  Diabetes:              >6.4%  Glycemic control for   <7.0% adults with diabetes          Passed - Valid encounter within last 6 months    Recent Outpatient Visits          4 months ago Type II diabetes mellitus with complication [facility])   [facility] Glean Hess, MD   1 year ago Type 2 diabetes mellitus with stage 4 chronic kidney disease, with long-term current use of insulin [facility])   [facility] Glean Hess, MD   1 year ago Type II diabetes mellitus with complication [facility])   [facility] Glean Hess, MD   1 year ago Chronic heart failure with preserved ejection fraction (HFpEF) [facility])   [facility] Glean Hess, MD   1 year ago Type II diabetes mellitus with complication [facility])   [facility] Glean Hess, MD      Future Appointments            In 3 weeks [place] Mel[place] Jesse Sans, MD [facility], [place]   In 1 month [place], Kathlene November, MD [facility], [place]

## 2020-10-20 NOTE — Telephone Encounter (Signed)
Requested medication (s) are due for refill today: yes  Requested medication (s) are on the active medication list: yes  Last refill:  03/07/20 #90  Future visit scheduled: yes  Notes to clinic:  overdue lab work   Requested Prescriptions  Pending Prescriptions Disp Refills   rosuvastatin (CRESTOR) 5 MG tablet [Pharmacy Med Name: ROSUVASTATIN '5MG'$  TABLETS] 90 tablet 0    Sig: TAKE 1 TABLET BY MOUTH 3 TIMES A WEEK      Cardiovascular:  Antilipid - Statins Failed - 10/20/2020 10:48 AM      Failed - Total Cholesterol in normal range and within 360 days    Cholesterol  Date Value Ref Range Status  10/18/2019 123 0 - 200 mg/dL Final          Failed - LDL in normal range and within 360 days    LDL Cholesterol  Date Value Ref Range Status  10/18/2019 67 0 - 99 mg/dL Final    Comment:           Total Cholesterol/HDL:CHD Risk Coronary Heart Disease Risk Table                     Men   Women  1/2 Average Risk   3.4   3.3  Average Risk       5.0   4.4  2 X Average Risk   9.6   7.1  3 X Average Risk  23.4   11.0        Use the calculated Patient Ratio above and the CHD Risk Table to determine the patient's CHD Risk.        ATP III CLASSIFICATION (LDL):  <100     mg/dL   Optimal  100-129  mg/dL   Near or Above                    Optimal  130-159  mg/dL   Borderline  160-189  mg/dL   High  >190     mg/dL   Very High Performed at [facility], [address] [place], [place] [address]           Failed - HDL in normal range and within 360 days    HDL  Date Value Ref Range Status  10/18/2019 35 (L) >40 mg/dL Final          Failed - Triglycerides in normal range and within 360 days    Triglycerides  Date Value Ref Range Status  10/18/2019 107 <150 mg/dL Final          Passed - Patient is not pregnant      Passed - Valid encounter within last 12 months    Recent Outpatient Visits           4 months ago Type II diabetes mellitus with complication [facility])    [facility] Glean Hess, MD   1 year ago Type 2 diabetes mellitus with stage 4 chronic kidney disease, with long-term current use of insulin [facility])   [facility] Glean Hess, MD   1 year ago Type II diabetes mellitus with complication [facility])   [facility] Glean Hess, MD   1 year ago Chronic heart failure with preserved ejection fraction (HFpEF) [facility])   [facility] Glean Hess, MD   1 year ago Type II diabetes mellitus with complication [facility])   [facility] Glean Hess, MD  Future Appointments             In 3 weeks [place] Melia, Jesse Sans, MD [facility], [place]   In 1 month [place], Kathlene November, MD [facility], [place]

## 2020-10-21 ENCOUNTER — Other Ambulatory Visit: Payer: Self-pay

## 2020-10-21 ENCOUNTER — Other Ambulatory Visit: Payer: Self-pay | Admitting: Internal Medicine

## 2020-10-21 DIAGNOSIS — I2510 Atherosclerotic heart disease of native coronary artery without angina pectoris: Secondary | ICD-10-CM

## 2020-11-12 ENCOUNTER — Encounter: Payer: Self-pay | Admitting: Internal Medicine

## 2020-11-12 ENCOUNTER — Other Ambulatory Visit: Payer: Self-pay

## 2020-11-12 ENCOUNTER — Ambulatory Visit (INDEPENDENT_AMBULATORY_CARE_PROVIDER_SITE_OTHER): Payer: Medicare HMO | Admitting: Internal Medicine

## 2020-11-12 VITALS — BP 142/64 | HR 47 | Temp 97.9°F | Ht 68.0 in | Wt 238.0 lb

## 2020-11-12 DIAGNOSIS — E118 Type 2 diabetes mellitus with unspecified complications: Secondary | ICD-10-CM

## 2020-11-12 DIAGNOSIS — I10 Essential (primary) hypertension: Secondary | ICD-10-CM

## 2020-11-12 LAB — POCT GLYCOSYLATED HEMOGLOBIN (HGB A1C): Hemoglobin A1C: 8.1 % — AB (ref 4.0–5.6)

## 2020-11-12 NOTE — Progress Notes (Signed)
Date:  11/12/2020   Name:  Kyle Roberson   DOB:  02/05/1941   MRN:  [id number]   Chief Complaint: Diabetes (Last BS pt doesn't remember )  Diabetes He presents for his follow-up diabetic visit. He has type 2 diabetes mellitus. His disease course has been stable. Pertinent negatives for hypoglycemia include no headaches or tremors. Pertinent negatives for diabetes include no chest pain, no fatigue, no polydipsia and no polyuria. Diabetic complications include heart disease, nephropathy and PVD. Current diabetic treatment includes oral agent (dual therapy) and insulin injections. He is compliant with treatment all of the time. His weight is stable. He monitors blood glucose at home 1-2 x per day. His breakfast blood glucose is taken between 7-8 am. His breakfast blood glucose range is generally 130-140 mg/dl. An ACE inhibitor/angiotensin II receptor blocker is not being taken. Eye exam is not current.  Hypertension This is a chronic problem. The problem is controlled. Associated symptoms include peripheral edema. Pertinent negatives include no chest pain, headaches, palpitations or shortness of breath. Past treatments include diuretics and direct vasodilators. Hypertensive end-organ damage includes kidney disease, CAD/MI and PVD.   Lab Results  Component Value Date   CREATININE 2.24 (H) 07/16/2020   BUN 59 (H) 07/16/2020   NA 140 07/16/2020   K 4.0 07/16/2020   CL 97 07/16/2020   CO2 28 07/16/2020   Lab Results  Component Value Date   CHOL 123 10/18/2019   HDL 35 (L) 10/18/2019   LDLCALC 67 10/18/2019   TRIG 107 10/18/2019   CHOLHDL 3.5 10/18/2019   Lab Results  Component Value Date   TSH 1.568 08/02/2018   Lab Results  Component Value Date   HGBA1C 8.1 (A) 11/12/2020   Lab Results  Component Value Date   WBC 8.2 07/28/2019   HGB 10.6 (L) 07/28/2019   HCT 34.5 (L) 07/28/2019   MCV 86.3 07/28/2019   PLT 207 07/28/2019   Lab Results  Component Value Date   ALT 18  06/18/2020   AST 21 06/18/2020   ALKPHOS 64 06/18/2020   BILITOT 0.5 06/18/2020     Review of Systems  Constitutional:  Negative for appetite change, fatigue and unexpected weight change.  Eyes:  Negative for visual disturbance.  Respiratory:  Negative for cough, shortness of breath and wheezing.   Cardiovascular:  Negative for chest pain, palpitations and leg swelling.  Gastrointestinal:  Negative for abdominal pain and blood in stool.  Endocrine: Negative for polydipsia and polyuria.  Genitourinary:  Negative for dysuria and hematuria.  Skin:  Negative for color change and rash.  Neurological:  Negative for tremors, numbness and headaches.  Psychiatric/Behavioral:  Negative for dysphoric mood.    Patient Active Problem List   Diagnosis Date Noted   Anasarca    Chronic a-fib ([place])    Hyperlipidemia associated with type 2 diabetes mellitus (HCC)    Acute on chronic diastolic CHF (congestive heart failure) ([place]) 07/19/2019   Abdominal swelling    SOB (shortness of breath) 05/07/2019   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 04/28/2019   CKD (chronic kidney disease) stage 4, GFR 15-29 ml/min (HCC) 04/27/2019   Chronic heart failure with preserved ejection fraction (HFpEF) ([place]) 04/27/2019   Morbid obesity ([place]) 04/27/2019   Lower extremity edema 03/23/2019   Erectile dysfunction 08/01/2018   History of colonic diverticulitis 08/01/2018   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   Obesity (BMI 30-39.9) 08/01/2018   Venous insufficiency 08/01/2018   Persistent atrial fibrillation ([place]) 08/01/2018  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   Essential hypertension 07/06/2018   Type II diabetes mellitus with complication ([place]) [id number]   Atherosclerotic heart disease of native coronary 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 ([place]) 06/05/2018   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   Vitamin D deficiency 01/30/2017   Benign non-nodular prostatic hyperplasia with lower  urinary tract symptoms 09/29/2014   History of MI (myocardial infarction) 06/27/2014   Nephrolithiasis 01/04/2012    Allergies  Allergen Reactions   Atorvastatin Other (See Comments)   Simvastatin Other (See Comments)    Past Surgical History:  Procedure Laterality Date   BLEPHAROPLASTY     CARDIAC CATHETERIZATION     CORONARY STENT INTERVENTION     CYSTOSCOPY     HERNIA REPAIR      Social History   Tobacco Use   Smoking status: Former   Smokeless tobacco: Never  Vaping Use   Vaping Use: Never used  Substance Use Topics   Alcohol use: Not Currently    Alcohol/week: 2.0 standard drinks    Types: 2 Glasses of wine per week   Drug use: Not Currently     Medication list has been reviewed and updated.  Current Meds  Medication Sig   apixaban (ELIQUIS) 2.5 MG TABS tablet Take 1 tablet (2.5 mg total) by mouth 2 (two) times daily.   Ascorbic Acid (VITAMIN C) 1000 MG tablet Take 1,000 mg by mouth daily.   cholecalciferol (VITAMIN D3) 25 MCG (1000 UT) tablet Take 2,000 Units by mouth daily.    dorzolamide-timolol (COSOPT) 22.3-6.8 MG/ML ophthalmic solution Place 1 drop into the left eye 2 (two) times daily.   erythromycin ophthalmic ointment as needed.   glipiZIDE (GLUCOTROL XL) 10 MG 24 hr tablet TAKE 1 TABLET(10 MG) BY MOUTH TWICE DAILY   hydrALAZINE (APRESOLINE) 100 MG tablet Take 1 tablet (100 mg total) by mouth 3 (three) times daily.   insulin NPH Human (NOVOLIN N) 100 UNIT/ML injection Inject 10-15 Units into the skin daily. Take 10-15 units twice a day after breakfast and before bedtime per Sliding Scale (BS>250: 15 units, BS= 200-250:10 units)   MAXITROL 3.5-10000-0.1 OINT Place 1 application into the left eye 2 (two) times daily.    metolazone (ZAROXOLYN) 2.5 MG tablet TAKE 1 TABLET BY MOUTH EVERY DAY AS NEEDED FOR [id number]   Multiple Vitamins-Minerals (MULTIVITAMIN WITH MINERALS) tablet Take 1 tablet by mouth daily.   nitroGLYCERIN (NITROLINGUAL) 0.4 MG/SPRAY spray  Place 1 spray under the tongue as directed.   Omega 3 1000 MG CAPS Take 1 capsule by mouth daily.   potassium chloride SA (KLOR-CON) 20 MEQ tablet Take 1 tablet (20 mEq total) by mouth daily as needed.   rosuvastatin (CRESTOR) 5 MG tablet TAKE 1 TABLET BY MOUTH 3 TIMES A WEEK   torsemide (DEMADEX) 20 MG tablet Take 3 tablets (60 mg total) by mouth daily.   UNABLE TO FIND Med Name: qunol tumeric extra str. For joints and inflammation    PHQ 2/9 Scores 06/18/2020 10/18/2019 07/19/2019 05/16/2019  PHQ - 2 Score 0 0 0 4  PHQ- 9 Score 0 0 2 8    GAD 7 : Generalized Anxiety Score 06/18/2020 10/18/2019 07/19/2019  Nervous, Anxious, on Edge 0 0 1  Control/stop worrying 0 0 0  Worry too much - different things 0 0 0  Trouble relaxing 0 0 0  Restless 0 0 0  Easily annoyed or irritable 0 0 2  Afraid - awful might happen 0 0 0  Total  GAD 7 Score 0 0 3  Anxiety Difficulty - Not difficult at all Not difficult at all    BP Readings from Last 3 Encounters:  11/12/20 (!) 142/64  07/16/20 130/60  06/18/20 130/66    Physical Exam Vitals and nursing note reviewed.  Constitutional:      General: He is not in acute distress.    Appearance: He is well-developed. He is obese.  HENT:     Head: Normocephalic and atraumatic.  Cardiovascular:     Rate and Rhythm: Normal rate and regular rhythm.  Pulmonary:     Effort: Pulmonary effort is normal. No respiratory distress.     Breath sounds: No wheezing or rhonchi.  Musculoskeletal:        General: Swelling present.     Cervical back: Normal range of motion.     Right lower leg: Edema present.     Left lower leg: Edema present.  Lymphadenopathy:     Cervical: No cervical adenopathy.  Skin:    General: Skin is warm and dry.     Findings: No rash.  Neurological:     General: No focal deficit present.     Mental Status: He is alert and oriented to person, place, and time.     Gait: Gait abnormal (uses cane).  Psychiatric:        Mood and Affect:  Mood normal.        Behavior: Behavior normal.    Wt Readings from Last 3 Encounters:  11/12/20 238 lb (108 kg)  07/16/20 230 lb 2 oz (104.4 kg)  06/18/20 235 lb (106.6 kg)    BP (!) 142/64   Pulse (!) 47   Temp 97.9 F (36.6 C) (Oral)   Ht '5\' 8"'$  (1.727 m)   Wt 238 lb (108 kg)   SpO2 95%   BMI 36.19 kg/m   Assessment and Plan: 1. Type II diabetes mellitus with complication (HCC) BS is not as well controlled as previously. Holding doses of insulin if BS < 140.  Recommend instead to take 1/2 of the usual insulin dose. Reminded to schedule DM eye exam. Declines foot exam - unable to remove shoes or put them on again - POCT glycosylated hemoglobin (Hb A1C) = 8.1 up from 7.6  2. Essential hypertension Clinically stable exam with well controlled BP. Tolerating medications without side effects at this time. Pt to continue current regimen and low sodium diet; benefits of regular exercise as able discussed. CHF appears compensated. Recent labs done by Nephrology were stable.   Partially dictated using Editor, commissioning. Any errors are unintentional.  Halina Maidens, MD [facility]  11/12/2020

## 2020-11-12 NOTE — Patient Instructions (Addendum)
Schedule Diabetic eye exam and request that the report be sent here.  Diabetes is not quite as good. I recommend giving a lower dose (half as much) of insulin at each meal - rather than holding the dose all together.

## 2020-11-15 ENCOUNTER — Other Ambulatory Visit: Payer: Self-pay

## 2020-11-15 ENCOUNTER — Emergency Department: Payer: Medicare HMO

## 2020-11-15 ENCOUNTER — Emergency Department
Admission: EM | Admit: 2020-11-15 | Discharge: 2020-11-15 | Disposition: A | Discharge status: Home / Self Care | Payer: Medicare HMO | Attending: Emergency Medicine | Admitting: Emergency Medicine

## 2020-11-15 DIAGNOSIS — I25118 Atherosclerotic heart disease of native coronary 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: Secondary | ICD-10-CM | POA: Diagnosis not present

## 2020-11-15 DIAGNOSIS — S0003XA Contusion of scalp, initial encounter: Secondary | ICD-10-CM | POA: Diagnosis not present

## 2020-11-15 DIAGNOSIS — Z23 Encounter for immunization: Secondary | ICD-10-CM | POA: Diagnosis not present

## 2020-11-15 DIAGNOSIS — N184 Chronic kidney disease, stage 4 (severe): Secondary | ICD-10-CM | POA: Insufficient documentation

## 2020-11-15 DIAGNOSIS — Z794 Long term (current) use of insulin: Secondary | ICD-10-CM | POA: Diagnosis not present

## 2020-11-15 DIAGNOSIS — S0101XA Laceration without foreign body of scalp, initial encounter: Secondary | ICD-10-CM | POA: Diagnosis not present

## 2020-11-15 DIAGNOSIS — W01198A Fall on same level from slipping, tripping and stumbling with subsequent striking against other object, initial encounter: Secondary | ICD-10-CM | POA: Diagnosis not present

## 2020-11-15 DIAGNOSIS — I4891 Unspecified atrial fibrillation: Secondary | ICD-10-CM | POA: Diagnosis not present

## 2020-11-15 DIAGNOSIS — Z7984 Long term (current) use of oral hypoglycemic drugs: Secondary | ICD-10-CM | POA: Diagnosis not present

## 2020-11-15 DIAGNOSIS — Z7901 Long term (current) use of anticoagulants: Secondary | ICD-10-CM | POA: Diagnosis not present

## 2020-11-15 DIAGNOSIS — E1122 Type 2 diabetes mellitus with diabetic chronic kidney disease: Secondary | ICD-10-CM | POA: Diagnosis not present

## 2020-11-15 DIAGNOSIS — S51019A Laceration without foreign body of unspecified elbow, initial encounter: Secondary | ICD-10-CM

## 2020-11-15 DIAGNOSIS — Z955 Presence of coronary angioplasty implant and graft: Secondary | ICD-10-CM | POA: Diagnosis not present

## 2020-11-15 DIAGNOSIS — S51812A Laceration without foreign body of left forearm, initial encounter: Secondary | ICD-10-CM | POA: Insufficient documentation

## 2020-11-15 DIAGNOSIS — Z87891 Personal history of nicotine dependence: Secondary | ICD-10-CM | POA: Insufficient documentation

## 2020-11-15 DIAGNOSIS — S0990XA Unspecified injury of head, initial encounter: Secondary | ICD-10-CM

## 2020-11-15 DIAGNOSIS — Z79899 Other long term (current) drug therapy: Secondary | ICD-10-CM | POA: Insufficient documentation

## 2020-11-15 DIAGNOSIS — S51012A Laceration without foreign body of left elbow, initial encounter: Secondary | ICD-10-CM | POA: Insufficient documentation

## 2020-11-15 DIAGNOSIS — I130 Hypertensive heart and chronic kidney disease with heart failure and stage 1 through stage 4 chronic kidney disease, or unspecified chronic kidney disease: Secondary | ICD-10-CM | POA: Diagnosis not present

## 2020-11-15 DIAGNOSIS — E041 Nontoxic single thyroid nodule: Secondary | ICD-10-CM | POA: Diagnosis not present

## 2020-11-15 DIAGNOSIS — I5033 Acute on chronic diastolic (congestive) heart failure: Secondary | ICD-10-CM | POA: Diagnosis not present

## 2020-11-15 DIAGNOSIS — Y92009 Unspecified place in unspecified non-institutional (private) residence as the place of occurrence of the external cause: Secondary | ICD-10-CM | POA: Diagnosis not present

## 2020-11-15 MED ORDER — TETANUS-DIPHTH-ACELL PERTUSSIS 5-2.5-18.5 LF-MCG/0.5 IM SUSY
0.5000 mL | PREFILLED_SYRINGE | Freq: Once | INTRAMUSCULAR | Status: AC
  Administered 2020-11-15: 0.5 mL via INTRAMUSCULAR
  Filled 2020-11-15: qty 0.5

## 2020-11-15 NOTE — ED Notes (Signed)
ED provider at bedside, pts scalp cleansed with saline - no obvious lacerations observed.

## 2020-11-15 NOTE — Discharge Instructions (Addendum)
Your CT head and cervical spine do not show any findings of concern today. You should have your caregiver check in on you every few hours over the next 24 hours. Keep the skin tears clean and dry. Change the bandages daily. Follow up with primary care for further evaluation of thyroid nodule that was discovered incidentally while getting CT of your cervical spine. Return to the emergency department for symptoms that change or worsen.

## 2020-11-15 NOTE — ED Triage Notes (Signed)
Pt fell backwards while attempting to throw a box out. Pt denies loc, is on eliquis. Pt with laceration to posterior skull, skin tear to posterior left elbow. Pt denies neck pain, hip pain, but stated to ems does have tailbone pain.

## 2020-11-15 NOTE — ED Provider Notes (Signed)
[facility] Emergency Department Provider Note ____________________________________________   Event Date/Time   First MD Initiated Contact with Patient 11/15/20 2127     (approximate)  I have reviewed the triage vital signs and the nursing notes.   HISTORY  Chief Complaint Fall  HPI Kyle Roberson is a 80 y.o. male with history of chronic A. fib on Eliquis and remaining history as listed below presents to the emergency department for treatment and evaluation after mechanical, nonsyncopal fall prior to arrival.  Patient states that he was attempting to toss a box and lost his balance.  He fell backward and hit his head on the floor.  He happened to have his cell phone with him and was able to call for assistance.  He has skin tear to the left elbow and forearm and is bleeding from the scalp.  Unknown last Tdap booster.         Past Medical History:  Diagnosis Date   AKI (acute kidney injury) ([place]) 07/06/2018   CAD (coronary artery disease)    Cellulitis of lower extremity 05/31/2019   Diabetes ([place])    GERD (gastroesophageal reflux disease)    HLD (hyperlipidemia)    HTN (hypertension)     Patient Active Problem List   Diagnosis Date Noted   Anasarca    Chronic a-fib ([place])    Hyperlipidemia associated with type 2 diabetes mellitus ([place])    Acute on chronic diastolic CHF (congestive heart failure) ([place]) 07/19/2019   Abdominal swelling    SOB (shortness of breath) 05/07/2019   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 04/28/2019   CKD (chronic kidney disease) stage 4, GFR 15-29 ml/min (HCC) 04/27/2019   Chronic heart failure with preserved ejection fraction (HFpEF) ([place]) 04/27/2019   Morbid obesity ([place]) 04/27/2019   Lower extremity edema 03/23/2019   Erectile dysfunction 08/01/2018   History of colonic diverticulitis 08/01/2018   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   Obesity (BMI 30-39.9) 08/01/2018   Venous insufficiency 08/01/2018   Persistent  atrial fibrillation ([place]) 08/01/2018   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   Essential hypertension 07/06/2018   Type II diabetes mellitus with complication ([place]) [id number]   Atherosclerotic heart disease of native coronary 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 ([place]) 06/05/2018   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 Authorizing Provider  apixaban (ELIQUIS) 2.5 MG TABS tablet Take 1 tablet (2.5 mg total) by mouth 2 (two) times daily. 06/23/20   Minna Merritts, MD  Ascorbic Acid (VITAMIN C) 1000 MG tablet Take 1,000 mg by mouth daily.    [provider]  cholecalciferol (VITAMIN D3) 25 MCG (1000 UT) tablet Take 2,000 Units by mouth daily.     [provider]  dorzolamide-timolol (COSOPT) 22.3-6.8 MG/ML ophthalmic solution Place 1 drop into the left eye 2 (two) times daily. 08/28/20   [provider]  erythromycin ophthalmic ointment as needed. 09/03/20   [provider]  glipiZIDE (GLUCOTROL XL) 10 MG 24 hr tablet TAKE 1 TABLET(10 MG) BY  MOUTH TWICE DAILY 10/20/20   Glean Hess, MD  hydrALAZINE (APRESOLINE) 100 MG tablet Take 1 tablet (100 mg total) by mouth 3 (three) times daily. 06/11/20   Minna Merritts, MD  insulin NPH Human (NOVOLIN N) 100 UNIT/ML injection Inject 10-15 Units into the skin daily. Take 10-15 units twice a day after breakfast and before bedtime per Sliding Scale (BS>250: 15 units, BS= 200-250:10 units) 11/30/17   [provider]  MAXITROL 3.5-10000-0.1  [place] 1 application into the left eye 2 (two) times daily.  05/03/19   [provider]  metolazone (ZAROXOLYN) 2.5 MG tablet TAKE 1 TABLET BY MOUTH EVERY DAY AS NEEDED FOR [id number] 06/12/20   Minna Merritts, MD  Multiple Vitamins-Minerals (MULTIVITAMIN WITH MINERALS) tablet Take 1 tablet by mouth daily.    [provider]  nitroGLYCERIN (NITROLINGUAL) 0.4 MG/SPRAY spray Place 1 spray under the tongue as directed. 07/19/19 11/12/20  Glean Hess, MD  Omega 3 1000 MG CAPS Take 1 capsule by mouth daily. 04/14/06   [provider]  potassium chloride SA (KLOR-CON) 20 MEQ tablet Take 1 tablet (20 mEq total) by mouth daily as needed. 06/11/20   Minna Merritts, MD  rosuvastatin (CRESTOR) 5 MG tablet TAKE 1 TABLET BY MOUTH 3 TIMES A WEEK 10/21/20   Glean Hess, MD  torsemide (DEMADEX) 20 MG tablet Take 3 tablets (60 mg total) by mouth daily. 06/11/20   Minna Merritts, MD  UNABLE TO FIND Med Name: qunol tumeric extra str. For joints and inflammation    [provider]    Allergies Atorvastatin and Simvastatin  Family History  Problem Relation Age of Onset   Pancreatic cancer Mother    CAD Father    Diabetes Brother     Social History Social History   Tobacco Use   Smoking status: Former   Smokeless tobacco: Never  Scientific laboratory technician Use: Never used  Substance Use Topics   Alcohol use: Not Currently    Alcohol/week: 2.0 standard drinks    Types: 2 Glasses of wine per week   Drug use: Not Currently    Review of Systems  Constitutional: No fever/chills Eyes: No visual changes. ENT: No sore throat. Cardiovascular: Denies chest pain. Respiratory: Denies shortness of breath. Gastrointestinal: No abdominal pain.  No nausea, no vomiting.  No diarrhea.  No constipation. Genitourinary: Negative for dysuria. Musculoskeletal: Negative for back pain.  Negative for extremity pain. Skin: Negative for rash. Neurological: Negative for  headaches, focal weakness or numbness.  ____________________________________________   PHYSICAL EXAM:  VITAL SIGNS: ED Triage Vitals  Enc Vitals Group     BP 11/15/20 1925 (!) 122/107     Pulse Rate 11/15/20 1923 60     Resp 11/15/20 1923 14     Temp 11/15/20 1923 97.8 F (36.6 C)     Temp src --      SpO2 11/15/20 1923 97 %     Weight 11/15/20 1921 234 lb (106.1 kg)     Height 11/15/20 1921 '5\' 8"'$  (1.727 m)     Head Circumference --      Peak Flow --      Pain Score 11/15/20 1922 0     Pain Loc --      Pain Edu? --      Excl. in [place]? --     Constitutional: Alert and oriented.  Overall well appearing and in no acute distress. Eyes: Conjunctivae are normal. PERRL. EOMI.  Head: Atraumatic. Nose: No congestion/rhinnorhea. Mouth/Throat: Mucous membranes are moist.  Oropharynx non-erythematous. Neck: No stridor.   Hematological/Lymphatic/Immunilogical: No cervical lymphadenopathy. Cardiovascular: Normal rate, regular rhythm. Grossly normal heart sounds.  Good peripheral circulation. Respiratory: Normal respiratory effort.  No retractions. Lungs CTAB. Gastrointestinal: Soft and nontender. No distention. No abdominal bruits. Genitourinary:  Musculoskeletal: No lower extremity tenderness nor edema.  No joint effusions. Neurologic:  Normal speech and language. No gross focal neurologic deficits are appreciated.  Skin:  Skin is warm, dry and intact. No rash noted. Psychiatric: Mood and affect are normal. Speech and behavior are normal.  ____________________________________________   LABS (all labs ordered are listed, but only abnormal results are displayed)  Labs Reviewed - No data to display ____________________________________________  EKG   ____________________________________________  RADIOLOGY  ED MD interpretation:    CT of the head and cervical spine is negative for acute concerns.  I, Sherrie George, personally viewed and evaluated these images (plain  radiographs) as part of my medical decision making, as well as reviewing the written report by the radiologist.  Official radiology report(s): CT HEAD WO CONTRAST (5MM)  Result Date: 11/15/2020 CLINICAL DATA:  [place], anticoagulated, posterior scalp laceration EXAM: CT HEAD WITHOUT CONTRAST TECHNIQUE: Contiguous axial images were obtained from the base of the skull through the vertex without intravenous contrast. COMPARISON:  07/07/2018 FINDINGS: Brain: No acute infarct or hemorrhage. Lateral ventricles and midline structures are stable. No acute extra-axial fluid collections. No mass effect. Stable cortical atrophy. Vascular: Stable atherosclerosis.  No hyperdense vessel. Skull: Scalp laceration along the left parietal convexity. No underlying fracture. The remainder of the calvarium is unremarkable. Sinuses/Orbits: No acute finding. Other: None. IMPRESSION: 1. Left parietal scalp laceration. 2. No acute intracranial process. Electronically Signed   By: Randa Ngo M.D.   On: 11/15/2020 20:41   CT Cervical Spine Wo Contrast  Result Date: 11/15/2020 CLINICAL DATA:  Fall with laceration EXAM: CT CERVICAL SPINE WITHOUT CONTRAST TECHNIQUE: Multidetector CT imaging of the cervical spine was performed without intravenous contrast. Multiplanar CT image reconstructions were also generated. COMPARISON:  None. FINDINGS: Alignment: Straightening of the cervical spine. No subluxation. Facet alignment within normal limits. Skull base and vertebrae: No acute fracture. No primary bone lesion or focal pathologic process. Soft tissues and spinal canal: No prevertebral fluid or swelling. No visible canal hematoma. Disc levels: Mild diffuse degenerative change C4 through C7. Facet degenerative changes at multiple levels. Upper chest: Lung apices are clear. 1.5 cm hypodense nodule in right lobe of thyroid. Other: None IMPRESSION: 1. Straightening of the cervical spine. No acute osseous abnormality 2. 1.5 cm hypodense nodule  in right lobe of thyroid. Recommend thyroid US (ref: J Am Coll Radiol. 2015 Feb;12(2): 143-50).This may be performed non emergently. Electronically Signed   By: Donavan Foil M.D.   On: 11/15/2020 20:44    ____________________________________________   PROCEDURES  Procedure(s) performed (including Critical Care):  Procedures  ____________________________________________   INITIAL IMPRESSION / ASSESSMENT AND PLAN     80 year old male presenting to the emergency department for treatment and evaluation after mechanical, nonsyncopal fall at home.  See HPI for further details.  While awaiting ER room assignment, CT of the head and cervical spine was obtained.  Both are negative for acute concerns.  Incidental finding of a thyroid nodule for which outpatient follow-up is appropriate.  No laceration identified on scalp however it does appear that he has an abrasion over a hematoma which is the source of bleeding.  There is no current  active bleeding.  Wound was cleaned with saline.  Wounds to the left elbow and forearm were also cleaned and dressed with Telfa and Coban.  Tdap booster given.  Patient will be discharged home with caregiver who lives at the residence as well.  Head injury instructions provided.  He has been instructed to return to the emergency department for immediate concerns.  Encouraged to have caregiver check on him every few hours this evening and through the night.  He is to follow-up with primary care for further evaluation of the incidental finding of the thyroid nodule as well as reevaluation of skin tears.  Wound care instructions were provided.    ___________________________________________   FINAL CLINICAL IMPRESSION(S) / ED DIAGNOSES  Final diagnoses:  Skin tear of elbow without complication, initial encounter  Skin tear of forearm without complication, left, initial encounter  Minor head injury, initial encounter  Thyroid nodule     ED Discharge Orders      None        Kashtin Beth was evaluated in Emergency Department on 11/15/2020 for the symptoms described in the history of present illness. He was evaluated in the context of the global COVID-19 pandemic, which necessitated consideration that the patient might be at risk for infection with the SARS-CoV-2 virus that causes COVID-19. Institutional protocols and algorithms that pertain to the evaluation of patients at risk for COVID-19 are in a state of rapid change based on information released by regulatory bodies including the CDC and federal and state organizations. These policies and algorithms were followed during the patient's care in the ED.   Note:  This document was prepared using Dragon voice recognition software and may include unintentional dictation errors.    Victorino Dike, FNP 11/15/20 2200    Harvest Dark, MD 11/15/20 [phone]

## 2020-11-15 NOTE — ED Notes (Signed)
Pt assisted to the bathroom.

## 2020-11-20 ENCOUNTER — Telehealth: Payer: Self-pay | Admitting: Cardiovascular Disease

## 2020-11-20 NOTE — Telephone Encounter (Signed)
Patient's SO called and states patient fell last Friday afternoon and hit his head and lost a lot of blood. Patient went to emergency Department via EMS. CT scan was performed.  STAT if patient feels like he/she is going to faint   Are you dizzy now? No. When he first gets up after sitting, he gets dizzy  Do you feel faint or have you passed out? No. He fell last Week.   Do you have any other symptoms? Elevated BP .  Have you checked your HR and BP (record if available)? 168/72.  Yesterday through last Saturday upper 180s to low 90s at rest

## 2020-11-20 NOTE — Telephone Encounter (Signed)
Was able to reach out to Kyle Roberson via phone, friend Kyle Roberson intervene the phone conversation (DPR approved). She reports pt was "fine up till his fall Saturday, he wasn't dizzy and his BP had been prefect". Reports she is concern that his BP has been elevated in the 160-180s SBP and Kyle Roberson has been dizzy when changing position.   Kyle Roberson had a mechanical fall when trying to throw a box, hit his head and had a scalp laceration, along with a few skin tears. This was Sat, 4 days ago.  Noted head CT was WNL, did not show bleed. Advised that since these symptoms have started post-fall/head injury. He could have a slight concussion, intercranial pressure, and/or a small bleed as he is on Eliquis. Educated Kyle Roberson that sometimes the initial CT scan can be normal, but after a few days can be abnormal, the affects of a head injury after a fall can be a few days after; delay reaction.  Suggested being reevaluated by PCP or maybe ED if not better to see about re-peat head CT given his elevated BP, dizziness, and that he is on Eliquis. Kyle Roberson verbalized understanding.   Kyle Roberson reports pt has been taking his BP meds as order Hydralazine 100 mg QID Before fall BP has been reading SBP 120-130s connectivity, since head injury has been elevated 160-180s. Kyle Roberson reports pt's medication bottle states to take 4 times a day, reports that is what pharmacy wrote on the script. Advised per [name] notes and his medication list from his EMR states  hydrALAZINE (APRESOLINE) 100 MG tablet [id number]    Order Details Dose: 100 mg Route: Oral Frequency: 3 times daily  Dispense Quantity: 270 tablet Refills: 3        Sig: Take 1 tablet (100 mg total) by mouth 3 (three) times daily.   Kyle Roberson reports Kyle Roberson has been taken QID, as they thought this was correct. Advised we can discuss meds further at up coming appt next week with [name] 11/27/2020. Kyle Roberson verbalized understanding. Although concern for BP elevation since head  injury/head. She is going to take Kyle Roberson to be evaluated at this time to make sure there is nothing s/p fall that could be contributing to his HTN such as ICP, slight bleed (on Eliquis), concussion, or other issues.  Kyle Roberson thankful for the return phone call, otherwise all questions or concerns were address and no additional concerns at this time. Agreeable to plan, will call back for anything further and will be at OV with [name] next week.

## 2020-11-26 NOTE — Progress Notes (Signed)
Cardiology Office Note  Date:  11/27/2020   ID:  Kyle Roberson, DOB [birth year], MRN [id number]  PCP:  Glean Hess, MD   Chief Complaint  Patient presents with   3 month follow up     Patient c/o falling on Aug. 26, 2022, hit his head and has felt dizzy since, LE edema and elevated blood pressure. Medications reviewed by the patient verbally.     HPI:  Kyle Roberson is a 80 y.o. male with history of  coronary artery disease,  persistent atrial fibrillation,  hypertension,  hyperlipidemia,  type 2 diabetes mellitus,  chronic kidney disease,  GERD, and  hyperparathyroidism,  who presents for follow-up of chronic HFpEF and persistent atrial fibrillation.     Presents today with his daughter Recent fall at home, mechanical 8/26/222 Trauma to the back of his head, left arm, back He called 911, Seen in the emergency room Since follows had some dizziness/gait instability BP elevated at home , though family increased hydralazine up to 4 times daily with improved numbers  Presents today in wheelchair, reports he feels well Continues to have significant leg swelling No improvement with higher dose torsemide Sometimes sitting with legs down Unable to tolerate Ace wraps or compression hose, " they burn" He is interested in lymphedema evaluation, possible PT for lymphedema  Taking torsemide 40 daily, daughter helping with medication compliance Limited mobility  EKG personally reviewed by myself on todays visit Atrial fibrillation ventricular rate 43 bpm  Followed by Dr. Holley Raring CR 2.34, BUN 47, 02/2020 Up from 2.32, BUN 37  in 08/2019  Other past medical history reviewed  April 2021,  significantly volume overloaded despite escalation of outpatient diuretics.  directly admitted to the hospital and underwent extensive IV diuresis.   Right heart catheterization was discussed, but the patient ultimately declined this. 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  PSH:    Past Surgical History:  Procedure Laterality Date   BLEPHAROPLASTY     CARDIAC CATHETERIZATION     CORONARY STENT INTERVENTION     CYSTOSCOPY     HERNIA REPAIR      Current Outpatient Medications  Medication Sig Dispense Refill   apixaban (ELIQUIS) 2.5 MG TABS tablet Take 1 tablet (2.5 mg total) by mouth 2 (two) times daily. 180 tablet 1   Ascorbic Acid (VITAMIN C) 1000 MG tablet Take 1,000 mg by mouth daily.     cholecalciferol (VITAMIN D3) 25 MCG (1000 UT) tablet Take 2,000 Units by mouth daily.      dorzolamide-timolol (COSOPT) 22.3-6.8 MG/ML ophthalmic solution Place 1 drop into the left eye 2 (two) times daily.     erythromycin ophthalmic ointment as needed.     glipiZIDE (GLUCOTROL XL) 10 MG 24 hr tablet TAKE 1 TABLET(10 MG) BY MOUTH TWICE DAILY 180 tablet 0   hydrALAZINE (APRESOLINE) 100 MG tablet Take 1 tablet (100 mg total) by mouth 3 (three) times daily. 270 tablet 3   insulin NPH Human (NOVOLIN N) 100 UNIT/ML injection Inject 10-15  Units into the skin daily. Take 10-15 units twice a day after breakfast and before bedtime per Sliding Scale (BS>250: 15 units, BS= 200-250:10 units)     MAXITROL 3.5-10000-0.1 OINT Place 1 application into the left eye 2 (two) times daily.      metolazone (ZAROXOLYN) 2.5 MG tablet TAKE 1 TABLET BY MOUTH EVERY DAY AS NEEDED FOR [id number] 90 tablet 1   Multiple Vitamins-Minerals (MULTIVITAMIN WITH MINERALS) tablet Take 1 tablet by mouth  daily.     nitroGLYCERIN (NITROLINGUAL) 0.4 MG/SPRAY spray Place 1 spray under the tongue as directed. 12 g 1   Omega 3 1000 MG CAPS Take 1 capsule by mouth daily.     potassium chloride SA (KLOR-CON) 20 MEQ tablet Take 1 tablet (20 mEq total) by mouth daily as needed. 90 tablet 3   rosuvastatin (CRESTOR) 5 MG tablet TAKE 1 TABLET BY MOUTH 3 TIMES A WEEK 30 tablet 0   torsemide (DEMADEX) 20 MG tablet Take 3 tablets (60 mg total) by mouth daily. 360 tablet 3   UNABLE TO FIND Med Name: qunol tumeric extra str. For joints and inflammation     No current facility-administered medications for this visit.    Allergies:   Atorvastatin and Simvastatin   Social History:  The patient  reports that he has quit smoking. He has never used smokeless tobacco. He reports that he does not currently use alcohol after a past usage of about 2.0 standard drinks per week. He reports that he does not currently use drugs.   Family History:   family history includes CAD in his father; Diabetes in his brother; Pancreatic cancer in his mother.    Review of Systems: Review of Systems  Constitutional: Negative.        Weight gain  HENT: Negative.    Respiratory:  Positive for shortness of breath.   Cardiovascular:  Positive for leg swelling.  Gastrointestinal: Negative.   Musculoskeletal: Negative.        Legs are weak  Neurological: Negative.   Psychiatric/Behavioral: Negative.    All other systems reviewed and are negative.  PHYSICAL EXAM: VS:  BP (!) 160/60 (BP Location: Right Arm, Patient Position: Sitting, Cuff Size: Normal)   Pulse (!) 43   Ht 5' 8.5" (1.74 m)   Wt 238 lb 6 oz (108.1 kg)   SpO2 97%   BMI 35.72 kg/m  , BMI Body mass index is 35.72 kg/m.  Constitutional:  oriented to person, place, and time. No distress.  HENT:  Head: Grossly normal Eyes:  no discharge. No scleral icterus.  Neck: No JVD, no carotid bruits  Cardiovascular: Regular rate and rhythm, no murmurs appreciated 2+  pitting lower extremity edema with blistering Pulmonary/Chest: Clear to auscultation bilaterally, no wheezes or rails Abdominal: Soft.  no distension.  no tenderness.  Musculoskeletal: Normal range of motion Neurological:  normal muscle tone. Coordination normal. No atrophy Skin: Skin warm and dry Psychiatric: normal affect, pleasant  Recent Labs: 06/18/2020: ALT 18; B Natriuretic Peptide 332.4 07/16/2020: BUN 59; Creatinine, Ser 2.24; Potassium 4.0; Sodium 140    Lipid Panel Lab Results  Component Value Date   CHOL 123 10/18/2019   HDL 35 (L) 10/18/2019   LDLCALC 67 10/18/2019   TRIG 107 10/18/2019    Wt Readings from Last 3 Encounters:  11/27/20 238 lb 6 oz (108.1 kg)  11/15/20 234 lb (106.1 kg)  11/12/20 238 lb (108 kg)     ASSESSMENT AND PLAN:  Problem List Items Addressed This  Visit       Cardiology Problems   Atherosclerotic heart disease of native coronary 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 ([place]) - Primary   Relevant Orders   EKG 12-Lead   Chronic heart failure with preserved ejection fraction (HFpEF) (HCC)   Relevant Orders   EKG 12-Lead   Essential hypertension   Persistent atrial fibrillation (HCC)   Relevant Orders   EKG 12-Lead     Other   Morbid obesity (HCC)   CKD (chronic kidney disease) stage 4, GFR 15-29 ml/min (HCC)   Persistent atrial fibrillation Slow ventricular rate, not on beta-blockers or calcium channel blockers Renally dosed Eliquis Asymptomatic despite rates in the 15s Denies orthostasis symptoms Monitor for now Long discussion with daughter concerning indications for pacer  Referral to EP offered, he has declined at this time, rate in the low 40s, reports he is asymptomatic Unable to exclude that this is contributing to his CHF symptoms Certainly recommended if he has any orthostasis symptoms call us, a ZIO monitor was also offered he has declined at this time  Chronic diastolic CHF Recommend he stay on torsemide 40  daily Consider continuing metolazone once a week Weight is higher approximately 10 pounds Moderate fluid intake Needs assistance with lymphedema compression pumps, unable to wear compression hose and Ace wraps as they burn Having some blistering, high risk of cellulitis  Morbid obesity Recent fall, unable to exercise, recommended calorie restriction  Essential hypertension Blood pressure is well controlled on today's visit. No changes made to the medications.    Total encounter time more than 35 minutes  Greater than 50% was spent in counseling and coordination of care with the patient    Signed, Esmond Plants, M.D., Ph.D. [place], [place]

## 2020-11-27 ENCOUNTER — Ambulatory Visit (INDEPENDENT_AMBULATORY_CARE_PROVIDER_SITE_OTHER): Payer: Medicare HMO | Admitting: Cardiovascular Disease

## 2020-11-27 ENCOUNTER — Encounter: Payer: Self-pay | Admitting: Cardiovascular Disease

## 2020-11-27 ENCOUNTER — Other Ambulatory Visit: Payer: Self-pay

## 2020-11-27 VITALS — BP 160/60 | HR 43 | Ht 68.5 in | Wt 238.4 lb

## 2020-11-27 DIAGNOSIS — I5032 Chronic diastolic (congestive) heart failure: Secondary | ICD-10-CM

## 2020-11-27 DIAGNOSIS — I25118 Atherosclerotic heart disease of native coronary 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: Secondary | ICD-10-CM

## 2020-11-27 DIAGNOSIS — I10 Essential (primary) hypertension: Secondary | ICD-10-CM

## 2020-11-27 DIAGNOSIS — I890 Lymphedema, not elsewhere classified: Secondary | ICD-10-CM

## 2020-11-27 DIAGNOSIS — N184 Chronic kidney disease, stage 4 (severe): Secondary | ICD-10-CM

## 2020-11-27 DIAGNOSIS — I4819 Other persistent atrial fibrillation: Secondary | ICD-10-CM

## 2020-11-27 DIAGNOSIS — I872 Venous insufficiency (chronic) (peripheral): Secondary | ICD-10-CM

## 2020-11-27 NOTE — Patient Instructions (Addendum)
We have placed a referral to vein and vascular in [place] They should call you in a few weeks to get your first appointment.  For lymphedema/pumps   Medication Instructions:  No changes  If you need a refill on your cardiac medications before your next appointment, please call your pharmacy.   Lab work: No new labs needed  Testing/Procedures: No new testing needed  Follow-Up: At [facility], you and your health needs are our priority.  As part of our continuing mission to provide you with exceptional heart care, we have created designated Provider Care Teams.  These Care Teams include your primary Cardiologist (physician) and Advanced Practice Providers (APPs -  Physician Assistants and Nurse Practitioners) who all work together to provide you with the care you need, when you need it.  You will need a follow up appointment in 6 months  Providers on your designated Care Team:   Murray Hodgkins, NP Christell Faith, PA-C Marrianne Mood, PA-C Cadence Anegam, [place]  COVID-19 Vaccine Information can be found at: [email or web address].uk For questions related to vaccine distribution or appointments, please email [email or web address] or call [phone].

## 2020-12-18 ENCOUNTER — Ambulatory Visit: Payer: Medicare HMO | Admitting: Cardiovascular Disease

## 2020-12-19 ENCOUNTER — Encounter (INDEPENDENT_AMBULATORY_CARE_PROVIDER_SITE_OTHER): Payer: BC Managed Care – PPO | Admitting: Vascular Surgery

## 2020-12-23 ENCOUNTER — Encounter (INDEPENDENT_AMBULATORY_CARE_PROVIDER_SITE_OTHER): Payer: Self-pay | Admitting: Vascular Surgery

## 2020-12-24 ENCOUNTER — Encounter (HOSPITAL_COMMUNITY): Payer: Self-pay | Admitting: Radiology

## 2020-12-30 ENCOUNTER — Encounter (INDEPENDENT_AMBULATORY_CARE_PROVIDER_SITE_OTHER): Payer: Self-pay | Admitting: Vascular Surgery

## 2020-12-30 ENCOUNTER — Ambulatory Visit (INDEPENDENT_AMBULATORY_CARE_PROVIDER_SITE_OTHER): Payer: Medicare HMO | Admitting: Vascular Surgery

## 2020-12-30 ENCOUNTER — Other Ambulatory Visit: Payer: Self-pay

## 2020-12-30 VITALS — BP 161/74 | HR 55 | Resp 16 | Ht 68.0 in | Wt 244.0 lb

## 2020-12-30 DIAGNOSIS — I10 Essential (primary) hypertension: Secondary | ICD-10-CM

## 2020-12-30 DIAGNOSIS — I890 Lymphedema, not elsewhere classified: Secondary | ICD-10-CM | POA: Diagnosis not present

## 2020-12-30 DIAGNOSIS — I482 Chronic atrial fibrillation, unspecified: Secondary | ICD-10-CM | POA: Diagnosis not present

## 2020-12-30 DIAGNOSIS — I872 Venous insufficiency (chronic) (peripheral): Secondary | ICD-10-CM

## 2021-01-02 ENCOUNTER — Telehealth: Payer: Self-pay | Admitting: Internal Medicine

## 2021-01-02 NOTE — Telephone Encounter (Signed)
Caller  would like PCP to review letter in patient chart dated for 12/24/2020 regarding patient 11/15/2020 imaging results. Caller is concerned and would like a follow up call today.

## 2021-01-02 NOTE — Telephone Encounter (Signed)
Pt had a fall and went to ER. Pt had a CT scan on 11/15/20. A Nodule was found on pts thyroid will doing the CT scan. Pt received a letter in the mail with results that recommended to have a a thyroid US for the pt. [name] stated letter stated to call PCP for referral.  KP

## 2021-01-03 ENCOUNTER — Other Ambulatory Visit: Payer: Self-pay | Admitting: Internal Medicine

## 2021-01-03 DIAGNOSIS — E041 Nontoxic single thyroid nodule: Secondary | ICD-10-CM

## 2021-01-07 ENCOUNTER — Encounter (INDEPENDENT_AMBULATORY_CARE_PROVIDER_SITE_OTHER): Payer: Self-pay | Admitting: Vascular Surgery

## 2021-01-07 NOTE — Progress Notes (Signed)
MRN : [id number]  Kyle Roberson is a 80 y.o. (11-25-1940) male who presents with chief complaint of check legs.  History of Present Illness:  Patient is seen for evaluation of leg swelling. The patient first noticed the swelling remotely but is now concerned because of a significant increase in the overall edema. The swelling is associated with pain and discoloration. The patient notes that in the morning the legs are significantly improved but they steadily worsened throughout the course of the day. Elevation makes the legs better, dependency makes them much worse.   There is no history of ulcerations associated with the swelling.   The patient denies any recent changes in their medications.  The patient has not been wearing graduated compression.  The patient has no had any past angiography, interventions or vascular surgery.  The patient denies a history of DVT or PE. There is no prior history of phlebitis. There is no history of primary lymphedema.  There is no history of radiation treatment to the groin or pelvis No history of malignancies. No history of trauma or groin or pelvic surgery. No history of foreign travel or parasitic infections area     Current Meds  Medication Sig   apixaban (ELIQUIS) 2.5 MG TABS tablet Take 1 tablet (2.5 mg total) by mouth 2 (two) times daily.   Ascorbic Acid (VITAMIN C) 1000 MG tablet Take 1,000 mg by mouth daily.   cholecalciferol (VITAMIN D3) 25 MCG (1000 UT) tablet Take 2,000 Units by mouth daily.    dorzolamide-timolol (COSOPT) 22.3-6.8 MG/ML ophthalmic solution Place 1 drop into the left eye 2 (two) times daily.   erythromycin ophthalmic ointment as needed.   glipiZIDE (GLUCOTROL XL) 10 MG 24 hr tablet TAKE 1 TABLET(10 MG) BY MOUTH TWICE DAILY   hydrALAZINE (APRESOLINE) 100 MG tablet Take 1 tablet (100 mg total) by mouth 3 (three) times daily.   insulin NPH Human (NOVOLIN N) 100 UNIT/ML injection Inject 10-15 Units into the skin  daily. Take 10-15 units twice a day after breakfast and before bedtime per Sliding Scale (BS>250: 15 units, BS= 200-250:10 units)   MAXITROL 3.5-10000-0.1 OINT Place 1 application into the left eye 2 (two) times daily.    metolazone (ZAROXOLYN) 2.5 MG tablet TAKE 1 TABLET BY MOUTH EVERY DAY AS NEEDED FOR [id number]   Multiple Vitamins-Minerals (MULTIVITAMIN WITH MINERALS) tablet Take 1 tablet by mouth daily.   nitroGLYCERIN (NITROLINGUAL) 0.4 MG/SPRAY spray Place 1 spray under the tongue as directed.   Omega 3 1000 MG CAPS Take 1 capsule by mouth daily.   potassium chloride SA (KLOR-CON) 20 MEQ tablet Take 1 tablet (20 mEq total) by mouth daily as needed.   rosuvastatin (CRESTOR) 5 MG tablet TAKE 1 TABLET BY MOUTH 3 TIMES A WEEK   torsemide (DEMADEX) 20 MG tablet Take 3 tablets (60 mg total) by mouth daily.   UNABLE TO FIND Med Name: qunol tumeric extra str. For joints and inflammation    Past Medical History:  Diagnosis Date   AKI (acute kidney injury) ([place]) 07/06/2018   CAD (coronary artery disease)    Cellulitis of lower extremity 05/31/2019   Diabetes (HCC)    GERD (gastroesophageal reflux disease)    HLD (hyperlipidemia)    HTN (hypertension)     Past Surgical History:  Procedure Laterality Date   BLEPHAROPLASTY     CARDIAC CATHETERIZATION     CORONARY STENT INTERVENTION     CYSTOSCOPY     HERNIA REPAIR  Social History Social History   Tobacco Use   Smoking status: Former   Smokeless tobacco: Never  Scientific laboratory technician Use: Never used  Substance Use Topics   Alcohol use: Not Currently    Alcohol/week: 2.0 standard drinks    Types: 2 Glasses of wine per week   Drug use: Not Currently    Family History Family History  Problem Relation Age of Onset   Pancreatic cancer Mother    CAD Father    Diabetes Brother     Allergies  Allergen Reactions   Atorvastatin Other (See Comments)   Simvastatin Other (See Comments)     REVIEW OF SYSTEMS (Negative unless  checked)  Constitutional: '[]'$ Weight loss  '[]'$ Fever  '[]'$ Chills Cardiac: '[]'$ Chest pain   '[]'$ Chest pressure   '[]'$ Palpitations   '[]'$ Shortness of breath when laying flat   '[]'$ Shortness of breath with exertion. Vascular:  '[]'$ Pain in legs with walking   '[x]'$ Pain in legs at rest  '[]'$ History of DVT   '[]'$ Phlebitis   '[x]'$ Swelling in legs   '[]'$ Varicose veins   '[]'$ Non-healing ulcers Pulmonary:   '[]'$ Uses home oxygen   '[]'$ Productive cough   '[]'$ Hemoptysis   '[]'$ Wheeze  '[]'$ COPD   '[]'$ Asthma Neurologic:  '[]'$ Dizziness   '[]'$ Seizures   '[]'$ History of stroke   '[]'$ History of TIA  '[]'$ Aphasia   '[]'$ Vissual changes   '[]'$ Weakness or numbness in arm   '[]'$ Weakness or numbness in leg Musculoskeletal:   '[]'$ Joint swelling   '[]'$ Joint pain   '[]'$ Low back pain Hematologic:  '[]'$ Easy bruising  '[]'$ Easy bleeding   '[]'$ Hypercoagulable state   '[]'$ Anemic Gastrointestinal:  '[]'$ Diarrhea   '[]'$ Vomiting  '[]'$ Gastroesophageal reflux/heartburn   '[]'$ Difficulty swallowing. Genitourinary:  '[]'$ Chronic kidney disease   '[]'$ Difficult urination  '[]'$ Frequent urination   '[]'$ Blood in urine Skin:  '[]'$ Rashes   '[]'$ Ulcers  Psychological:  '[]'$ History of anxiety   '[]'$  History of major depression.  Physical Examination  Vitals:   12/30/20 1519  BP: (!) 161/74  Pulse: (!) 55  Resp: 16  Weight: 244 lb (110.7 kg)  Height: '5\' 8"'$  (1.727 m)   Body mass index is 37.1 kg/m. Gen: WD/WN, NAD Head: [place]/AT, No temporalis wasting.  Ear/Nose/Throat: Hearing grossly intact, nares w/o erythema or drainage, pinna without lesions Eyes: PER, EOMI, sclera nonicteric.  Neck: Supple, no gross masses.  No JVD.  Pulmonary:  Good air movement, no audible wheezing, no use of accessory muscles.  Cardiac: RRR, precordium not hyperdynamic. Vascular:  scattered varicosities present bilaterally.  Moderate venous stasis changes to the legs bilaterally.  3+ soft pitting edema  Vessel Right Left  Radial Palpable Palpable  Gastrointestinal: soft, non-distended. No guarding/no peritoneal signs.  Musculoskeletal: M/S 5/5 throughout.  No  deformity.  Neurologic: CN 2-12 intact. Pain and light touch intact in extremities.  Symmetrical.  Speech is fluent. Motor exam as listed above. Psychiatric: Judgment intact, Mood & affect appropriate for pt's clinical situation. Dermatologic: Venous rashes no ulcers noted.  No changes consistent with cellulitis. Lymph : No lichenification or skin changes of chronic lymphedema.  CBC Lab Results  Component Value Date   WBC 8.2 07/28/2019   HGB 10.6 (L) 07/28/2019   HCT 34.5 (L) 07/28/2019   MCV 86.3 07/28/2019   PLT 207 07/28/2019    BMET    Component Value Date/Time   NA 140 07/16/2020 1217   K 4.0 07/16/2020 1217   CL 97 07/16/2020 1217   CO2 28 07/16/2020 1217   GLUCOSE 182 (H) 07/16/2020 1217   GLUCOSE 124 (H) 06/18/2020 1130  BUN 59 (H) 07/16/2020 1217   CREATININE 2.24 (H) 07/16/2020 1217   CALCIUM 9.7 07/16/2020 1217   GFRNONAA 27 (L) 06/18/2020 1130   GFRAA 33 (L) 10/18/2019 1111   CrCl cannot be calculated (Patient's most recent lab result is older than the maximum 21 days allowed.).  COAG No results found for: INR, PROTIME  Radiology No results found.   Assessment/Plan: 1. Venous insufficiency I have had a long discussion with the patient regarding swelling and why it  causes symptoms.  Patient will begin wearing graduated compression stockings class 1 (20-30 mmHg) on a daily basis a prescription was given. The patient will  beginning wearing the stockings first thing in the morning and removing them in the evening. The patient is instructed specifically not to sleep in the stockings.   In addition, behavioral modification will be initiated.  This will include frequent elevation, use of over the counter pain medications and exercise such as walking.  I have reviewed systemic causes for chronic edema such as liver, kidney and cardiac etiologies.  The patient denies problems with these organ systems.    Consideration for a lymph pump will also be made based  upon the effectiveness of conservative therapy.  This would help to improve the edema control and prevent sequela such as ulcers and infections   Patient should undergo duplex ultrasound of the venous system to ensure that DVT or reflux is not present.  The patient will follow-up with me after the ultrasound.   - VAS [place] LOWER EXTREMITY VENOUS REFLUX; Future  2. Lymphedema I have had a long discussion with the patient regarding swelling and why it  causes symptoms.  Patient will begin wearing graduated compression stockings class 1 (20-30 mmHg) on a daily basis a prescription was given. The patient will  beginning wearing the stockings first thing in the morning and removing them in the evening. The patient is instructed specifically not to sleep in the stockings.   In addition, behavioral modification will be initiated.  This will include frequent elevation, use of over the counter pain medications and exercise such as walking.  I have reviewed systemic causes for chronic edema such as liver, kidney and cardiac etiologies.  The patient denies problems with these organ systems.    Consideration for a lymph pump will also be made based upon the effectiveness of conservative therapy.  This would help to improve the edema control and prevent sequela such as ulcers and infections   Patient should undergo duplex ultrasound of the venous system to ensure that DVT or reflux is not present.  The patient will follow-up with me after the ultrasound.   - VAS [place] LOWER EXTREMITY VENOUS REFLUX; Future  3. Essential hypertension Continue antihypertensive medications as already ordered, these medications have been reviewed and there are no changes at this time.   4. Chronic a-fib (HCC) Continue antiarrhythmia medications as already ordered, these medications have been reviewed and there are no changes at this time.  Continue anticoagulation as ordered by Cardiology Service     Hortencia Pilar,  MD  01/07/2021 3:44 PM

## 2021-01-09 ENCOUNTER — Ambulatory Visit: Payer: Medicare HMO

## 2021-01-13 ENCOUNTER — Ambulatory Visit (INDEPENDENT_AMBULATORY_CARE_PROVIDER_SITE_OTHER): Payer: BC Managed Care – PPO | Admitting: Vascular Surgery

## 2021-01-13 ENCOUNTER — Encounter (INDEPENDENT_AMBULATORY_CARE_PROVIDER_SITE_OTHER): Payer: BC Managed Care – PPO

## 2021-01-14 ENCOUNTER — Other Ambulatory Visit: Payer: Self-pay | Admitting: Cardiovascular Disease

## 2021-01-14 ENCOUNTER — Other Ambulatory Visit: Payer: Self-pay | Admitting: Internal Medicine

## 2021-01-14 ENCOUNTER — Ambulatory Visit: Payer: Medicare HMO

## 2021-01-14 ENCOUNTER — Encounter: Payer: Self-pay | Admitting: General Surgery

## 2021-01-14 DIAGNOSIS — I2510 Atherosclerotic heart disease of native coronary artery without angina pectoris: Secondary | ICD-10-CM

## 2021-01-14 DIAGNOSIS — Z794 Long term (current) use of insulin: Secondary | ICD-10-CM

## 2021-01-14 NOTE — Telephone Encounter (Signed)
Requested Prescriptions  Pending Prescriptions Disp Refills  . glipiZIDE (GLUCOTROL XL) 10 MG 24 hr tablet [Pharmacy Med Name: GLIPIZIDE ER 10MG  TABLETS] 180 tablet 0    Sig: TAKE 1 TABLET(10 MG) BY MOUTH TWICE DAILY     Endocrinology:  Diabetes - Sulfonylureas Failed - 01/14/2021 11:05 AM      Failed - HBA1C is between 0 and 7.9 and within 180 days    Hemoglobin A1C  Date Value Ref Range Status  11/12/2020 8.1 (A) 4.0 - 5.6 % Final  07/22/2018 10.0  Final   Hgb A1c MFr Bld  Date Value Ref Range Status  06/18/2020 7.6 (H) 4.8 - 5.6 % Final    Comment:    (NOTE) Pre diabetes:          5.7%-6.4%  Diabetes:              >6.4%  Glycemic control for   <7.0% adults with diabetes          Passed - Valid encounter within last 6 months    Recent Outpatient Visits          2 months ago Type II diabetes mellitus with complication [facility])   [facility] Glean Hess, MD   7 months ago Type II diabetes mellitus with complication [facility])   [facility] Glean Hess, MD   1 year ago Type 2 diabetes mellitus with stage 4 chronic kidney disease, with long-term current use of insulin [facility])   [facility] Glean Hess, MD   1 year ago Type II diabetes mellitus with complication [facility])   [facility] Glean Hess, MD   1 year ago Chronic heart failure with preserved ejection fraction (HFpEF) [facility])   [facility] Glean Hess, MD      Future Appointments            In 2 months [place] Melia Jesse Sans, MD [facility], [place]   In 4 months [place], Kathlene November, MD [facility]           . rosuvastatin (CRESTOR) 5 MG tablet [Pharmacy Med Name: ROSUVASTATIN 5MG  TABLETS] 30 tablet 0    Sig: TAKE 1 TABLET BY MOUTH 3 TIMES A WEEK     Cardiovascular:  Antilipid - Statins Failed - 01/14/2021 11:05 AM      Failed - Total Cholesterol in normal range and within 360 days    Cholesterol  Date Value Ref  Range Status  10/18/2019 123 0 - 200 mg/dL Final         Failed - LDL in normal range and within 360 days    LDL Cholesterol  Date Value Ref Range Status  10/18/2019 67 0 - 99 mg/dL Final    Comment:           Total Cholesterol/HDL:CHD Risk Coronary Heart Disease Risk Table                     Men   Women  1/2 Average Risk   3.4   3.3  Average Risk       5.0   4.4  2 X Average Risk   9.6   7.1  3 X Average Risk  23.4   11.0        Use the calculated Patient Ratio above and the CHD Risk Table to determine the patient's CHD Risk.        ATP III CLASSIFICATION (LDL):  <  100     mg/dL   Optimal  100-129  mg/dL   Near or Above                    Optimal  130-159  mg/dL   Borderline  160-189  mg/dL   High  >190     mg/dL   Very High Performed at [facility], [address] [place], [place] [address]          Failed - HDL in normal range and within 360 days    HDL  Date Value Ref Range Status  10/18/2019 35 (L) >40 mg/dL Final         Failed - Triglycerides in normal range and within 360 days    Triglycerides  Date Value Ref Range Status  10/18/2019 107 <150 mg/dL Final         Passed - Patient is not pregnant      Passed - Valid encounter within last 12 months    Recent Outpatient Visits          2 months ago Type II diabetes mellitus with complication [facility])   [facility] Glean Hess, MD   7 months ago Type II diabetes mellitus with complication [facility])   [facility] Glean Hess, MD   1 year ago Type 2 diabetes mellitus with stage 4 chronic kidney disease, with long-term current use of insulin [facility])   [facility] Glean Hess, MD   1 year ago Type II diabetes mellitus with complication [facility])   [facility] Glean Hess, MD   1 year ago Chronic heart failure with preserved ejection fraction (HFpEF) [facility])   [facility] Glean Hess, MD      Future Appointments            In 2  months Army Melia Jesse Sans, MD [facility], [place]   In 4 months [place], Kathlene November, MD [facility], [place]

## 2021-01-14 NOTE — Telephone Encounter (Signed)
Please review for refill, Thanks !  

## 2021-01-14 NOTE — Telephone Encounter (Signed)
Prescription refill request for Eliquis received. Indication:afib Last office visit:gollan 11/27/20 Scr:2.24 07/16/20 Age: 56m Weight:110.7kg

## 2021-01-15 NOTE — Telephone Encounter (Signed)
Verified with pharmacy refill order received yesterday 01/14/21 #30 tabs no refill. This amount will get patient to her future OV on 03/19/21.  Most recent lipids 10/18/19-overdue. This is a duplicate request-not needed at this time.

## 2021-01-27 ENCOUNTER — Ambulatory Visit (INDEPENDENT_AMBULATORY_CARE_PROVIDER_SITE_OTHER): Payer: BC Managed Care – PPO | Admitting: Vascular Surgery

## 2021-01-27 ENCOUNTER — Encounter (INDEPENDENT_AMBULATORY_CARE_PROVIDER_SITE_OTHER): Payer: BC Managed Care – PPO

## 2021-01-28 ENCOUNTER — Emergency Department: Payer: Medicare HMO

## 2021-01-28 ENCOUNTER — Other Ambulatory Visit: Payer: Self-pay

## 2021-01-28 ENCOUNTER — Emergency Department
Admission: EM | Admit: 2021-01-28 | Discharge: 2021-01-29 | Disposition: A | Discharge status: Home / Self Care | Payer: Medicare HMO | Attending: Emergency Medicine | Admitting: Emergency Medicine

## 2021-01-28 DIAGNOSIS — I130 Hypertensive heart and chronic kidney disease with heart failure and stage 1 through stage 4 chronic kidney disease, or unspecified chronic kidney disease: Secondary | ICD-10-CM | POA: Insufficient documentation

## 2021-01-28 DIAGNOSIS — Z7901 Long term (current) use of anticoagulants: Secondary | ICD-10-CM | POA: Diagnosis not present

## 2021-01-28 DIAGNOSIS — I5033 Acute on chronic diastolic (congestive) heart failure: Secondary | ICD-10-CM | POA: Insufficient documentation

## 2021-01-28 DIAGNOSIS — E785 Hyperlipidemia, unspecified: Secondary | ICD-10-CM | POA: Diagnosis not present

## 2021-01-28 DIAGNOSIS — E1122 Type 2 diabetes mellitus with diabetic chronic kidney disease: Secondary | ICD-10-CM | POA: Diagnosis not present

## 2021-01-28 DIAGNOSIS — I2510 Atherosclerotic heart disease of native coronary artery without angina pectoris: Secondary | ICD-10-CM | POA: Insufficient documentation

## 2021-01-28 DIAGNOSIS — Z794 Long term (current) use of insulin: Secondary | ICD-10-CM | POA: Insufficient documentation

## 2021-01-28 DIAGNOSIS — I6203 Nontraumatic chronic subdural hemorrhage: Secondary | ICD-10-CM | POA: Insufficient documentation

## 2021-01-28 DIAGNOSIS — N184 Chronic kidney disease, stage 4 (severe): Secondary | ICD-10-CM | POA: Insufficient documentation

## 2021-01-28 DIAGNOSIS — E1169 Type 2 diabetes mellitus with other specified complication: Secondary | ICD-10-CM | POA: Insufficient documentation

## 2021-01-28 DIAGNOSIS — Z79899 Other long term (current) drug therapy: Secondary | ICD-10-CM | POA: Insufficient documentation

## 2021-01-28 DIAGNOSIS — Z87891 Personal history of nicotine dependence: Secondary | ICD-10-CM | POA: Diagnosis not present

## 2021-01-28 DIAGNOSIS — Z7984 Long term (current) use of oral hypoglycemic drugs: Secondary | ICD-10-CM | POA: Diagnosis not present

## 2021-01-28 DIAGNOSIS — S065XAA Traumatic subdural hemorrhage with loss of consciousness status unknown, initial encounter: Secondary | ICD-10-CM

## 2021-01-28 LAB — COMPREHENSIVE METABOLIC PANEL
ALT: 17 U/L (ref 0–44)
AST: 20 U/L (ref 15–41)
Albumin: 3.8 g/dL (ref 3.5–5.0)
Alkaline Phosphatase: 64 U/L (ref 38–126)
Anion gap: 9 (ref 5–15)
BUN: 44 mg/dL — ABNORMAL HIGH (ref 8–23)
CO2: 27 mmol/L (ref 22–32)
Calcium: 9.2 mg/dL (ref 8.9–10.3)
Chloride: 102 mmol/L (ref 98–111)
Creatinine, Ser: 2.16 mg/dL — ABNORMAL HIGH (ref 0.61–1.24)
GFR, Estimated: 30 mL/min — ABNORMAL LOW (ref >60)
Glucose, Bld: 198 mg/dL — ABNORMAL HIGH (ref 70–99)
Potassium: 3.8 mmol/L (ref 3.5–5.1)
Sodium: 138 mmol/L (ref 135–145)
Total Bilirubin: 0.7 mg/dL (ref 0.3–1.2)
Total Protein: 7 g/dL (ref 6.5–8.1)

## 2021-01-28 LAB — DIFFERENTIAL
Abs Immature Granulocytes: 0.03 10*3/uL (ref 0.00–0.07)
Basophils Absolute: 0 10*3/uL (ref 0.0–0.1)
Basophils Relative: 0 %
Eosinophils Absolute: 0.3 10*3/uL (ref 0.0–0.5)
Eosinophils Relative: 4 %
Immature Granulocytes: 0 %
Lymphocytes Relative: 11 %
Lymphs Abs: 0.8 10*3/uL (ref 0.7–4.0)
Monocytes Absolute: 0.8 10*3/uL (ref 0.1–1.0)
Monocytes Relative: 11 %
Neutro Abs: 5.4 10*3/uL (ref 1.7–7.7)
Neutrophils Relative %: 74 %

## 2021-01-28 LAB — CBC
HCT: 40.3 % (ref 39.0–52.0)
Hemoglobin: 13 g/dL (ref 13.0–17.0)
MCH: 30.7 pg (ref 26.0–34.0)
MCHC: 32.3 g/dL (ref 30.0–36.0)
MCV: 95 fL (ref 80.0–100.0)
Platelets: 203 10*3/uL (ref 150–400)
RBC: 4.24 MIL/uL (ref 4.22–5.81)
RDW: 13.9 % (ref 11.5–15.5)
WBC: 7.5 10*3/uL (ref 4.0–10.5)
nRBC: 0 % (ref 0.0–0.2)

## 2021-01-28 LAB — APTT: aPTT: 42 seconds — ABNORMAL HIGH (ref 24–36)

## 2021-01-28 LAB — PROTIME-INR
INR: 1.2 (ref 0.8–1.2)
Prothrombin Time: 15.6 seconds — ABNORMAL HIGH (ref 11.4–15.2)

## 2021-01-28 MED ORDER — DORZOLAMIDE HCL-TIMOLOL MAL 2-0.5 % OP SOLN
1.0000 [drp] | Freq: Two times a day (BID) | OPHTHALMIC | Status: DC
  Filled 2021-01-28: qty 10

## 2021-01-28 MED ORDER — INSULIN NPH (HUMAN) (ISOPHANE) 100 UNIT/ML ~~LOC~~ SUSP
10.0000 [IU] | Freq: Every day | SUBCUTANEOUS | Status: DC
Start: 2021-01-29 — End: 2021-01-29

## 2021-01-28 MED ORDER — ASCORBIC ACID 500 MG PO TABS: 1000.0000 mg | ORAL_TABLET | Freq: Every day | ORAL | Status: DC

## 2021-01-28 MED ORDER — TORSEMIDE 20 MG PO TABS: 60.0000 mg | ORAL_TABLET | Freq: Every day | ORAL | Status: DC

## 2021-01-28 MED ORDER — VITAMIN D 25 MCG (1000 UNIT) PO TABS: 2000.0000 [IU] | ORAL_TABLET | Freq: Every day | ORAL | Status: DC

## 2021-01-28 MED ORDER — GLIPIZIDE ER 10 MG PO TB24: 10.0000 mg | ORAL_TABLET | Freq: Every day | ORAL | Status: DC

## 2021-01-28 MED ORDER — HYDRALAZINE HCL 50 MG PO TABS
100.0000 mg | ORAL_TABLET | Freq: Three times a day (TID) | ORAL | Status: DC
  Administered 2021-01-28: 100 mg via ORAL
  Filled 2021-01-28: qty 2

## 2021-01-28 MED ORDER — METOLAZONE 2.5 MG PO TABS
2.5000 mg | ORAL_TABLET | Freq: Once | ORAL | Status: DC
  Filled 2021-01-28: qty 1

## 2021-01-28 MED ORDER — ADULT MULTIVITAMIN W/MINERALS CH: 1.0000 | ORAL_TABLET | Freq: Every day | ORAL | Status: DC

## 2021-01-28 MED ORDER — NEOMYCIN-POLYMYXIN-DEXAMETH 0.1 % OP OINT: 1.0000 "application " | TOPICAL_OINTMENT | Freq: Two times a day (BID) | OPHTHALMIC | Status: DC

## 2021-01-28 NOTE — ED Provider Notes (Signed)
[facility] Emergency Department Provider Note   ____________________________________________   Event Date/Time   First MD Initiated Contact with Patient 01/28/21 2129     (approximate)  I have reviewed the triage vital signs and the nursing notes.   HISTORY  Chief Complaint Dizziness    HPI Kyle Roberson is a 80 y.o. male patient fell 3 months ago today.  He was doing well he was on Eliquis when he fell.  Sunday 2 days ago he had difficulty holding a fork and then today he had difficulty writing a check.  He came into the emergency room and CT showed a chronic subdural hematoma with some new bleeding in it.  Patient's wife reports he is not quite as sharp as he usually is.  Otherwise he is doing well.         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 04/28/2019   CKD (chronic kidney disease) stage 4, GFR 15-29 ml/min (HCC) 04/27/2019   Chronic heart failure with preserved ejection fraction (HFpEF) ([place]) 04/27/2019   Morbid obesity ([place]) 04/27/2019   Lymphedema 03/23/2019   Erectile dysfunction 08/01/2018   History of colonic diverticulitis 08/01/2018   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   Obesity (BMI 30-39.9) 08/01/2018   Venous insufficiency 08/01/2018   Persistent atrial fibrillation ([place]) 08/01/2018   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   Essential hypertension 07/06/2018    Type II diabetes mellitus with complication ([place]) 80/99/8338   Atherosclerotic heart disease of native coronary 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 ([place]) 06/05/2018   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 Authorizing Provider  Ascorbic Acid (VITAMIN C) 1000 MG tablet Take 1,000 mg by mouth daily.    [provider]  cholecalciferol (VITAMIN D3) 25 MCG (1000 UT) tablet Take 2,000 Units by mouth daily.     [provider]  dorzolamide-timolol (COSOPT) 22.3-6.8 MG/ML ophthalmic solution Place 1 drop into the left eye 2 (two) times daily. 08/28/20   [provider]  ELIQUIS 2.5 MG TABS tablet TAKE 1 TABLET(2.5 MG) BY MOUTH TWICE DAILY 01/14/21   Minna Merritts, MD  erythromycin ophthalmic ointment as needed. 09/03/20   [provider]  glipiZIDE (GLUCOTROL XL) 10 MG 24 hr tablet TAKE 1 TABLET(10 MG) BY MOUTH TWICE DAILY 01/14/21   Glean Hess, MD  hydrALAZINE (APRESOLINE) 100 MG tablet Take 1 tablet (100  mg total) by mouth 3 (three) times daily. 06/11/20   Minna Merritts, MD  insulin NPH Human (NOVOLIN N) 100 UNIT/ML injection Inject 10-15 Units into the skin daily. Take 10-15 units twice a day after breakfast and before bedtime per Sliding Scale (BS>250: 15 units, BS= 200-250:10 units) 11/30/17   [provider]  MAXITROL 3.5-10000-0.1 [place] 1 application into the left eye 2 (two) times daily.  05/03/19   [provider]  metolazone (ZAROXOLYN) 2.5 MG tablet TAKE 1  TABLET BY MOUTH EVERY DAY AS NEEDED FOR TIRWE>315 06/12/20   Minna Merritts, MD  Multiple Vitamins-Minerals (MULTIVITAMIN WITH MINERALS) tablet Take 1 tablet by mouth daily.    [provider]  nitroGLYCERIN (NITROLINGUAL) 0.4 MG/SPRAY spray Place 1 spray under the tongue as directed. 07/19/19 12/30/20  Glean Hess, MD  Omega 3 1000 MG CAPS Take 1 capsule by mouth daily. 04/14/06   [provider]  potassium chloride SA (KLOR-CON) 20 MEQ tablet Take 1 tablet (20 mEq total) by mouth daily as needed. 06/11/20   Minna Merritts, MD  rosuvastatin (CRESTOR) 5 MG tablet TAKE 1 TABLET BY MOUTH 3 TIMES A WEEK 01/14/21   Glean Hess, MD  torsemide (DEMADEX) 20 MG tablet Take 3 tablets (60 mg total) by mouth daily. 06/11/20   Minna Merritts, MD  UNABLE TO FIND Med Name: qunol tumeric extra str. For joints and inflammation    [provider]    Allergies Atorvastatin and Simvastatin  Family History  Problem Relation Age of Onset   Pancreatic cancer Mother    CAD Father    Diabetes Brother     Social History Social History   Tobacco Use   Smoking status: Former   Smokeless tobacco: Never  Scientific laboratory technician Use: Never used  Substance Use Topics   Alcohol use: Not Currently    Alcohol/week: 2.0 standard drinks    Types: 2 Glasses of wine per week   Drug use: Not Currently    Review of Systems  Constitutional: No fever/chills Eyes: No visual changes. ENT: No sore throat. Cardiovascular: Denies chest pain. Respiratory: Denies shortness of breath. Gastrointestinal: No abdominal pain.  No nausea, no vomiting.  No diarrhea.  No constipation. Genitourinary: Negative for dysuria. Musculoskeletal: Negative for back pain. Skin: Negative for rash. Neurological: Negative for headaches, focal weakness   ____________________________________________   PHYSICAL EXAM:  VITAL SIGNS: ED Triage Vitals  Enc Vitals Group     BP 01/28/21 1859 (!) 164/55      Pulse Rate 01/28/21 1859 61     Resp 01/28/21 1859 20     Temp 01/28/21 1859 97.7 F (36.5 C)     Temp Source 01/28/21 1859 Oral     SpO2 01/28/21 1859 95 %     Weight 01/28/21 1910 230 lb (104.3 kg)     Height 01/28/21 1910 5\' 8"  (1.727 m)     Head Circumference --      Peak Flow --      Pain Score 01/28/21 1910 0     Pain Loc --      Pain Edu? --      Excl. in Industry? --     Constitutional: Alert and oriented. Well appearing and in no acute distress. Eyes: Left eye is blind right eye is normal extraocular movements are intact Head: Atraumatic. Nose: No congestion/rhinnorhea. Mouth/Throat: Mucous membranes are moist.  Oropharynx non-erythematous. Neck: No stridor. Cardiovascular: Normal rate, regular  rhythm. Grossly normal heart sounds.  Good peripheral circulation. Respiratory: Normal respiratory effort.  No retractions. Lungs CTAB. Gastrointestinal: Soft and nontender. No distention. No abdominal bruits.  Musculoskeletal: No lower extremity tenderness  Neurologic:  Normal speech and language. No gross focal neurologic deficits are appreciated.  Cranial nerves II through XII are intact except for coarse to blind left eye.  Cerebellar finger-nose rapid alternating movements in the hand are both slightly slowed but no focal findings are present that I can see.  Motor strength is 5/5 throughout.  Patient does not report any new numbness. Skin:  Skin is warm, dry and intact. No rash noted.   ____________________________________________   LABS (all labs ordered are listed, but only abnormal results are displayed)  Labs Reviewed  PROTIME-INR - Abnormal; Notable for the following components:      Result Value   Prothrombin Time 15.6 (*)    All other components within normal limits  APTT - Abnormal; Notable for the following components:   aPTT 42 (*)    All other components within normal limits  COMPREHENSIVE METABOLIC PANEL - Abnormal; Notable for the following components:    Glucose, Bld 198 (*)    BUN 44 (*)    Creatinine, Ser 2.16 (*)    GFR, Estimated 30 (*)    All other components within normal limits  CBC  DIFFERENTIAL   ____________________________________________  EKG  EKG read interpreted by me shows A. fib at a rate of 53 normal axis prolonged QRS duration ____________________________________________  RADIOLOGY Gertha Calkin, personally viewed and evaluated these images (plain radiographs) as part of my medical decision making, as well as reviewing the written report by the radiologist.  ED MD interpretation: CT of the head read by radiology reviewed by me shows a left-sided subdural with some acute bleeding some midline shift 5 mm.  Official radiology report(s): CT HEAD WO CONTRAST  Result Date: 01/28/2021 CLINICAL DATA:  Initial evaluation for neuro deficit, stroke suspected, dizziness. EXAM: CT HEAD WITHOUT CONTRAST TECHNIQUE: Contiguous axial images were obtained from the base of the skull through the vertex without intravenous contrast. COMPARISON:  CT from 11/15/2020 FINDINGS: Brain: Age-related cerebral atrophy with mild chronic small vessel ischemic disease. There is a mixed density extra-axial collection overlying the left cerebral convexity, likely subdural in location. Collection is largely isointense, although some hyperdense material is present as well. Findings consistent with an acute on subacute subdural hematoma. This measures up to 1.7 cm in maximal diameter at the left frontal convexity. Associated mild mass effect on the subjacent left cerebral hemisphere with up to 5 mm of left-to-right shift. No hydrocephalus or trapping. Basilar cisterns remain patent. No other acute intracranial hemorrhage. No acute large vessel territory infarct. No mass lesion. Vascular: No hyperdense vessel. Scattered vascular calcifications noted within the carotid siphons. Skull: No visible scalp soft tissue abnormality.  Calvarium intact.  Sinuses/Orbits: Globes and orbital soft tissues demonstrate no acute finding. Paranasal sinuses and mastoid air cells are largely clear. Other: None. IMPRESSION: 1. Acute on subacute left subdural hematoma measuring up to 1.7 cm in maximal diameter at the left frontal convexity. Associated mild mass effect on the subjacent left cerebral hemisphere with up to 5 mm of left-to-right shift. No hydrocephalus or trapping. 2. No other acute intracranial abnormality. 3. 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 Critical Value/emergent results were called by telephone at the time of interpretation on 01/28/2021 at 9:23 pm to provider Dr. Cinda Quest, who verbally  acknowledged these results. Electronically Signed   By: Jeannine Boga M.D.   On: 01/28/2021 21:30    ____________________________________________   PROCEDURES  Procedure(s) performed (including Critical Care):  Procedures   ____________________________________________   INITIAL IMPRESSION / ASSESSMENT AND PLAN / ED COURSE  ----------------------------------------- 10:42 PM on 01/28/2021 ----------------------------------------- Discussed CT with Dr. Lacinda Axon who is the neurosurgeon on-call.  He reviewed the films.  He is not concerned about the appearance of the CT.  He recommends repeat CT in 6 hours if it is stable patient can go home or he can wait till morning and be evaluated.  Patient would prefer to wait till morning to be evaluated.  Of course if the patient develops new symptoms or if CT is worse we will call Dr. Lacinda Axon.    ----------------------------------------- 11:43 PM on 01/28/2021 ----------------------------------------- I signed out this patient to the oncoming physician         ____________________________________________   FINAL CLINICAL IMPRESSION(S) / ED DIAGNOSES  Final diagnoses:  Subdural hematoma     ED Discharge Orders     None        Note:  This document was  prepared using Dragon voice recognition software and may include unintentional dictation errors.    Nena Polio, MD 01/28/21 [phone]

## 2021-01-28 NOTE — ED Notes (Signed)
Pt refused to take metolazone and refused PIV placement at this time

## 2021-01-28 NOTE — ED Triage Notes (Signed)
Pt presents to ER c/o some dizziness and ataxia that started on Sunday.  Pt states that he "feels fine" but when he has to do motor skills, he has been having trouble.  Pt currently A&O x4 at this time.  No slurred speech noted.  Pt denies numbness or unilateral weakness.

## 2021-01-29 ENCOUNTER — Telehealth: Payer: Self-pay

## 2021-01-29 ENCOUNTER — Other Ambulatory Visit: Payer: Self-pay | Admitting: Internal Medicine

## 2021-01-29 ENCOUNTER — Emergency Department: Payer: Medicare HMO

## 2021-01-29 DIAGNOSIS — E041 Nontoxic single thyroid nodule: Secondary | ICD-10-CM

## 2021-01-29 DIAGNOSIS — I6203 Nontraumatic chronic subdural hemorrhage: Secondary | ICD-10-CM | POA: Diagnosis not present

## 2021-01-29 MED ORDER — LEVETIRACETAM 500 MG PO TABS: 500.0000 mg | ORAL_TABLET | Freq: Two times a day (BID) | ORAL | 0 refills | Status: DC

## 2021-01-29 NOTE — ED Notes (Signed)
Pt is taking his time to eat crackers and take sips of water before getting in wheelchair for discharge. Have asked pt several times to get from bed to wheelchair and that he can take snacks with him. Told pt will return in 5 minutes once he is finished eating as he refuses to get in wheelchair at this time. Wife has left room to pull car in front of emergency entrance.

## 2021-01-29 NOTE — Telephone Encounter (Signed)
[name] given the number to reschedule the CT.

## 2021-01-29 NOTE — Discharge Instructions (Addendum)
Please hold your eliquis at this time. Please seek medical attention for any high fevers, chest pain, shortness of breath, change in behavior, persistent vomiting, bloody stool or any other new or concerning symptoms.

## 2021-01-29 NOTE — Telephone Encounter (Signed)
Copied from [id number]. Topic: Appointment Scheduling - Scheduling Inquiry for Clinic >> Jan 29, 2021 10:23 AM Valere Dross wrote: Reason for CRM: [name] called in on behalf of the pt, stating they wanted to reschedule his CT scan thar was for today 11/09, and wants to know if another order can be sent over to do at another time, please advise.

## 2021-01-29 NOTE — Consult Note (Signed)
Neurosurgery-New Consultation Evaluation 01/29/2021 Kyle Roberson [id number]  Identifying Statement: Kyle Roberson is a 80 y.o. male from [place] [id number] with a history of PVD, GERD, diabetes, A fib on Eliquis, HLD, HTN, and iatrogenic left eye blindness  Physician Requesting Consultation: No ref. provider found  History of Present Illness: Kyle Roberson is a 80 y.o the above medical history presenting for dizziness and difficulty with hand coordination over the last day or so.  His wife presents with him today and reports a fall resulting in a closed head injury about 3 months ago.  He presented to the ER at that time and underwent a head CT which did not show any acute concerning findings.  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  Other than the above symptoms, he denies any changes in vision, difficulty ambulating, severe headache, weakness or altered mental status.  Past Medical History:  Past Medical History:  Diagnosis Date   AKI (acute kidney injury) ([place]) 07/06/2018   CAD (coronary artery disease)    Cellulitis of lower extremity 05/31/2019   Diabetes (H[facility])    GERD (gastroesophageal reflux disease)    HLD (hyperlipidemia)    HTN (hypertension)     Social History: Social History   Socioeconomic History   Marital status: Married    Spouse name: Not on file   Number of children: Not on file   Years of education: Not on file   Highest education level: Not on file  Occupational History   Not on file  Tobacco Use   Smoking status: Former   Smokeless tobacco: Never  Vaping Use   Vaping Use: Never used  Substance and Sexual Activity   Alcohol use: Not Currently    Alcohol/week: 2.0 standard drinks    Types: 2 Glasses of wine per week   Drug use: Not Currently   Sexual activity: Not on file  Other Topics Concern   Not on file  Social  History Narrative   Lives at home with wife   Social Determinants of Health   Financial Resource Strain: Not on file  Food Insecurity: Not on file  Transportation Needs: Not on file  Physical Activity: Not on file  Stress: Not on file  Social Connections: Not on file  Intimate Partner Violence: Not on file   Living arrangements (living alone, with partner): with his wife  Family History: Family History  Problem Relation Age of Onset   Pancreatic cancer Mother    CAD Father    Diabetes Brother     Review of Systems:  Review of Systems - General ROS: Negative Psychological ROS: Negative Ophthalmic ROS: Negative ENT ROS: Negative Hematological and Lymphatic ROS: Negative  Endocrine ROS: Negative Respiratory ROS: Negative Cardiovascular ROS: Negative Gastrointestinal ROS: Negative Genito-Urinary ROS: Negative Musculoskeletal ROS: Negative Neurological ROS: Negative Dermatological ROS: Negative  Physical Exam: BP (!) 173/80   Pulse 62   Temp 97.7 F (36.5 C) (Oral)   Resp 17   Ht 5\' 8"  (1.727 m)   Wt 104.3 kg   SpO2 97%   BMI 34.97 kg/m  Body mass index is 34.97 kg/m. Body surface area is 2.24 meters squared. General appearance: Alert, cooperative, in no acute distress Head: Normocephalic, atraumatic Eyes: right eye Normal, EOM intact on right. Left eye with total blindness.  Pupil was not reactive to light. Oropharynx: Moist without lesions Neck: Supple, no tenderness Lungs: good air  exchange Abdomen: Soft Ext: 2+ pitting edema bilaterally   Neurologic exam:  Mental status: alertness: alert, orientation: person, place, time, affect: normal Speech: fluent and clear. Mild word finding difficulty Cranial nerves:  II: Visual fields are full by confrontation on the right.  III/IV/VI: extra-ocular motions intact on the right  V/VII:no evidence of facial droop or weakness and facial sensation intact VIII: hearing normal XI: trapezius strength symmetric,   sternocleidomastoid strength symmetric XII: tongue strength symmetric  Motor:strength symmetric 5/5, normal muscle mass and tone in all extremities mild right pronator drift  Sensory: intact to light touch in all extremities Reflexes: 1+ and symmetric bilaterally for arms and legs.  Negative Hoffmann's, negative clonus Coordination: intact finger to nose but slowed.  Gait: Untested.  Patient ambulates with a 4 pronged cane at baseline  Laboratory: Results for orders placed or performed during the hospital encounter of 01/28/21  Protime-INR  Result Value Ref Range   Prothrombin Time 15.6 (H) 11.4 - 15.2 seconds   INR 1.2 0.8 - 1.2  APTT  Result Value Ref Range   aPTT 42 (H) 24 - 36 seconds  CBC  Result Value Ref Range   WBC 7.5 4.0 - 10.5 K/uL   RBC 4.24 4.22 - 5.81 MIL/uL   Hemoglobin 13.0 13.0 - 17.0 g/dL   HCT 40.3 39.0 - 52.0 %   MCV 95.0 80.0 - 100.0 fL   MCH 30.7 26.0 - 34.0 pg   MCHC 32.3 30.0 - 36.0 g/dL   RDW 13.9 11.5 - 15.5 %   Platelets 203 150 - 400 K/uL   nRBC 0.0 0.0 - 0.2 %  Differential  Result Value Ref Range   Neutrophils Relative % 74 %   Neutro Abs 5.4 1.7 - 7.7 K/uL   Lymphocytes Relative 11 %   Lymphs Abs 0.8 0.7 - 4.0 K/uL   Monocytes Relative 11 %   Monocytes Absolute 0.8 0.1 - 1.0 K/uL   Eosinophils Relative 4 %   Eosinophils Absolute 0.3 0.0 - 0.5 K/uL   Basophils Relative 0 %   Basophils Absolute 0.0 0.0 - 0.1 K/uL   Immature Granulocytes 0 %   Abs Immature Granulocytes 0.03 0.00 - 0.07 K/uL  Comprehensive metabolic panel  Result Value Ref Range   Sodium 138 135 - 145 mmol/L   Potassium 3.8 3.5 - 5.1 mmol/L   Chloride 102 98 - 111 mmol/L   CO2 27 22 - 32 mmol/L   Glucose, Bld 198 (H) 70 - 99 mg/dL   BUN 44 (H) 8 - 23 mg/dL   Creatinine, Ser 2.16 (H) 0.61 - 1.24 mg/dL   Calcium 9.2 8.9 - 10.3 mg/dL   Total Protein 7.0 6.5 - 8.1 g/dL   Albumin 3.8 3.5 - 5.0 g/dL   AST 20 15 - 41 U/L   ALT 17 0 - 44 U/L   Alkaline Phosphatase 64 38 -  126 U/L   Total Bilirubin 0.7 0.3 - 1.2 mg/dL   GFR, Estimated 30 (L) >60 mL/min   Anion gap 9 5 - 15   I personally reviewed labs  Imaging:  01/28/21 CT head IMPRESSION: 1. Acute on subacute left subdural hematoma measuring up to 1.7 cm in maximal diameter at the left frontal convexity. Associated mild mass effect on the subjacent left cerebral hemisphere with up to 5 mm of left-to-right shift. No hydrocephalus or trapping. 2. No other acute intracranial abnormality. 3. 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   Critical  Value/emergent results were called by telephone at the time of interpretation on 01/28/2021 at 9:23 pm to provider Dr. Cinda Quest, who verbally acknowledged these results.     Electronically Signed   By: Kyle Roberson M.D.   On: 01/28/2021 21:30  01/29/21 CT head IMPRESSION: 1. No significant interval change in size and appearance of mixed density extra-axial hemorrhage overlying the left cerebral convexity. No evidence for interval bleeding. Similar mass effect with associated 5 mm left-to-right shift, stable. 2. No other new acute intracranial abnormality.     Electronically Signed   By: Kyle Roberson M.D.   On: 01/29/2021 04:14  I personally reviewed radiology studies to include:   Impression/Plan:   Kyle Roberson is a 80 year old presenting with difficulty in hand coordination and a remote history of a fall resulting in a closed head injury.  Head CT showing chronic left-sided subdural hematoma.   1.  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  2.  Plan - 500mg  Keppra twice daily for seizure prophylaxis x1 week - Hold home Eliquis at this time -Plan for outpatient follow-up in 2 weeks with repeat head CT prior -No plan for acute neurosurgical intervention at this time -Patient and wife made aware of strict return  precautions. -Please feel free to call neurosurgery with any questions or concerns   Cooper Render PA-C Neurosurgery

## 2021-01-29 NOTE — ED Notes (Signed)
Pt to radiology for ct at this time

## 2021-01-29 NOTE — ED Notes (Signed)
Signature pad not working. Pt and wife give verbal consent for discharge.

## 2021-01-29 NOTE — H&P (View-Only) (Signed)
Neurosurgery-New Consultation Evaluation 01/29/2021 [facility] [id number]  Identifying Statement: Kyle Roberson is a 80 y.o. male from [place] [id number] with a history of PVD, GERD, diabetes, A fib on Eliquis, HLD, HTN, and iatrogenic left eye blindness  Physician Requesting Consultation: No ref. provider found  History of Present Illness: Kyle Roberson is a 80 y.o the above medical history presenting for dizziness and difficulty with hand coordination over the last day or so.  His wife presents with him today and reports a fall resulting in a closed head injury about 3 months ago.  He presented to the ER at that time and underwent a head CT which did not show any acute concerning findings.  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  Other than the above symptoms, he denies any changes in vision, difficulty ambulating, severe headache, weakness or altered mental status.  Past Medical History:  Past Medical History:  Diagnosis Date   AKI (acute kidney injury) ([facility]) 07/06/2018   CAD (coronary artery disease)    Cellulitis of lower extremity 05/31/2019   Diabetes ([facility])    GERD (gastroesophageal reflux disease)    HLD (hyperlipidemia)    HTN (hypertension)     Social History: Social History   Socioeconomic History   Marital status: Married    Spouse name: Not on file   Number of children: Not on file   Years of education: Not on file   Highest education level: Not on file  Occupational History   Not on file  Tobacco Use   Smoking status: Former   Smokeless tobacco: Never  Vaping Use   Vaping Use: Never used  Substance and Sexual Activity   Alcohol use: Not Currently    Alcohol/week: 2.0 standard drinks    Types: 2 Glasses of wine per week   Drug use: Not Currently   Sexual activity: Not on file  Other Topics Concern   Not on file  Social  History Narrative   Lives at home with wife   Social Determinants of Health   Financial Resource Strain: Not on file  Food Insecurity: Not on file  Transportation Needs: Not on file  Physical Activity: Not on file  Stress: Not on file  Social Connections: Not on file  Intimate Partner Violence: Not on file   Living arrangements (living alone, with partner): with his wife  Family History: Family History  Problem Relation Age of Onset   Pancreatic cancer Mother    CAD Father    Diabetes Brother     Review of Systems:  Review of Systems - General ROS: Negative Psychological ROS: Negative Ophthalmic ROS: Negative ENT ROS: Negative Hematological and Lymphatic ROS: Negative  Endocrine ROS: Negative Respiratory ROS: Negative Cardiovascular ROS: Negative Gastrointestinal ROS: Negative Genito-Urinary ROS: Negative Musculoskeletal ROS: Negative Neurological ROS: Negative Dermatological ROS: Negative  Physical Exam: BP (!) 173/80   Pulse 62   Temp 97.7 F (36.5 C) (Oral)   Resp 17   Ht 5\' 8"  (1.727 m)   Wt 104.3 kg   SpO2 97%   BMI 34.97 kg/m  Body mass index is 34.97 kg/m. Body surface area is 2.24 meters squared. General appearance: Alert, cooperative, in no acute distress Head: Normocephalic, atraumatic Eyes: right eye Normal, EOM intact on right. Left eye with total blindness.  Pupil was not reactive to light. Oropharynx: Moist without lesions Neck: Supple, no tenderness Lungs: good air  exchange Abdomen: Soft Ext: 2+ pitting edema bilaterally   Neurologic exam:  Mental status: alertness: alert, orientation: person, place, time, affect: normal Speech: fluent and clear. Mild word finding difficulty Cranial nerves:  II: Visual fields are full by confrontation on the right.  III/IV/VI: extra-ocular motions intact on the right  V/VII:no evidence of facial droop or weakness and facial sensation intact VIII: hearing normal XI: trapezius strength symmetric,   sternocleidomastoid strength symmetric XII: tongue strength symmetric  Motor:strength symmetric 5/5, normal muscle mass and tone in all extremities mild right pronator drift  Sensory: intact to light touch in all extremities Reflexes: 1+ and symmetric bilaterally for arms and legs.  Negative Hoffmann's, negative clonus Coordination: intact finger to nose but slowed.  Gait: Untested.  Patient ambulates with a 4 pronged cane at baseline  Laboratory: Results for orders placed or performed during the hospital encounter of 01/28/21  Protime-INR  Result Value Ref Range   Prothrombin Time 15.6 (H) 11.4 - 15.2 seconds   INR 1.2 0.8 - 1.2  APTT  Result Value Ref Range   aPTT 42 (H) 24 - 36 seconds  CBC  Result Value Ref Range   WBC 7.5 4.0 - 10.5 K/uL   RBC 4.24 4.22 - 5.81 MIL/uL   Hemoglobin 13.0 13.0 - 17.0 g/dL   HCT 40.3 39.0 - 52.0 %   MCV 95.0 80.0 - 100.0 fL   MCH 30.7 26.0 - 34.0 pg   MCHC 32.3 30.0 - 36.0 g/dL   RDW 13.9 11.5 - 15.5 %   Platelets 203 150 - 400 K/uL   nRBC 0.0 0.0 - 0.2 %  Differential  Result Value Ref Range   Neutrophils Relative % 74 %   Neutro Abs 5.4 1.7 - 7.7 K/uL   Lymphocytes Relative 11 %   Lymphs Abs 0.8 0.7 - 4.0 K/uL   Monocytes Relative 11 %   Monocytes Absolute 0.8 0.1 - 1.0 K/uL   Eosinophils Relative 4 %   Eosinophils Absolute 0.3 0.0 - 0.5 K/uL   Basophils Relative 0 %   Basophils Absolute 0.0 0.0 - 0.1 K/uL   Immature Granulocytes 0 %   Abs Immature Granulocytes 0.03 0.00 - 0.07 K/uL  Comprehensive metabolic panel  Result Value Ref Range   Sodium 138 135 - 145 mmol/L   Potassium 3.8 3.5 - 5.1 mmol/L   Chloride 102 98 - 111 mmol/L   CO2 27 22 - 32 mmol/L   Glucose, Bld 198 (H) 70 - 99 mg/dL   BUN 44 (H) 8 - 23 mg/dL   Creatinine, Ser 2.16 (H) 0.61 - 1.24 mg/dL   Calcium 9.2 8.9 - 10.3 mg/dL   Total Protein 7.0 6.5 - 8.1 g/dL   Albumin 3.8 3.5 - 5.0 g/dL   AST 20 15 - 41 U/L   ALT 17 0 - 44 U/L   Alkaline Phosphatase 64 38 -  126 U/L   Total Bilirubin 0.7 0.3 - 1.2 mg/dL   GFR, Estimated 30 (L) >60 mL/min   Anion gap 9 5 - 15   I personally reviewed labs  Imaging:  01/28/21 CT head IMPRESSION: 1. Acute on subacute left subdural hematoma measuring up to 1.7 cm in maximal diameter at the left frontal convexity. Associated mild mass effect on the subjacent left cerebral hemisphere with up to 5 mm of left-to-right shift. No hydrocephalus or trapping. 2. No other acute intracranial abnormality. 3. 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   Critical  Value/emergent results were called by telephone at the time of interpretation on 01/28/2021 at 9:23 pm to provider Dr. Cinda Quest, who verbally acknowledged these results.     Electronically Signed   By: Jeannine Boga M.D.   On: 01/28/2021 21:30  01/29/21 CT head IMPRESSION: 1. No significant interval change in size and appearance of mixed density extra-axial hemorrhage overlying the left cerebral convexity. No evidence for interval bleeding. Similar mass effect with associated 5 mm left-to-right shift, stable. 2. No other new acute intracranial abnormality.     Electronically Signed   By: Jeannine Boga M.D.   On: 01/29/2021 04:14  I personally reviewed radiology studies to include:   Impression/Plan:   Kyle Roberson is a 80 year old presenting with difficulty in hand coordination and a remote history of a fall resulting in a closed head injury.  Head CT showing chronic left-sided subdural hematoma.   1.  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  2.  Plan - 500mg  Keppra twice daily for seizure prophylaxis x1 week - Hold home Eliquis at this time -Plan for outpatient follow-up in 2 weeks with repeat head CT prior -No plan for acute neurosurgical intervention at this time -Patient and wife made aware of strict return  precautions. -Please feel free to call neurosurgery with any questions or concerns   Cooper Render PA-C Neurosurgery

## 2021-01-29 NOTE — ED Provider Notes (Signed)
-----------------------------------------   6:49 AM on 01/29/2021 -----------------------------------------  I took over care of this patient from Dr. Cinda Quest.  Repeat CT shows no change in the hemorrhage.  On reassessment, the patient appears comfortable and is alert and oriented.  Plan will be for neurosurgery to assess the patient in person, with likely discharge home.  I will sign the patient out to the oncoming ED physician at [place], [place], [place] 01/29/21 [phone]

## 2021-01-29 NOTE — ED Notes (Signed)
Pt alert and oriented, sitting at side of bed with family at bedside. Neurosurgery PA has evaluated pt this morning and from neuro standpoint, pt can discharge and follow up outpatient. Provided graham crackers and PB to pt per request.

## 2021-01-30 ENCOUNTER — Ambulatory Visit: Payer: Medicare HMO

## 2021-02-02 ENCOUNTER — Emergency Department: Payer: Medicare HMO

## 2021-02-02 ENCOUNTER — Other Ambulatory Visit: Payer: Self-pay

## 2021-02-02 DIAGNOSIS — E1122 Type 2 diabetes mellitus with diabetic chronic kidney disease: Secondary | ICD-10-CM | POA: Insufficient documentation

## 2021-02-02 DIAGNOSIS — N184 Chronic kidney disease, stage 4 (severe): Secondary | ICD-10-CM | POA: Insufficient documentation

## 2021-02-02 DIAGNOSIS — I6201 Nontraumatic acute subdural hemorrhage: Secondary | ICD-10-CM | POA: Insufficient documentation

## 2021-02-02 DIAGNOSIS — Z7984 Long term (current) use of oral hypoglycemic drugs: Secondary | ICD-10-CM | POA: Insufficient documentation

## 2021-02-02 DIAGNOSIS — Z7901 Long term (current) use of anticoagulants: Secondary | ICD-10-CM | POA: Insufficient documentation

## 2021-02-02 DIAGNOSIS — I130 Hypertensive heart and chronic kidney disease with heart failure and stage 1 through stage 4 chronic kidney disease, or unspecified chronic kidney disease: Secondary | ICD-10-CM | POA: Insufficient documentation

## 2021-02-02 DIAGNOSIS — R29818 Other symptoms and signs involving the nervous system: Secondary | ICD-10-CM | POA: Insufficient documentation

## 2021-02-02 DIAGNOSIS — Z794 Long term (current) use of insulin: Secondary | ICD-10-CM | POA: Insufficient documentation

## 2021-02-02 DIAGNOSIS — I2510 Atherosclerotic heart disease of native coronary artery without angina pectoris: Secondary | ICD-10-CM | POA: Insufficient documentation

## 2021-02-02 DIAGNOSIS — Z79899 Other long term (current) drug therapy: Secondary | ICD-10-CM | POA: Insufficient documentation

## 2021-02-02 DIAGNOSIS — E785 Hyperlipidemia, unspecified: Secondary | ICD-10-CM | POA: Insufficient documentation

## 2021-02-02 DIAGNOSIS — I6203 Nontraumatic chronic subdural hemorrhage: Secondary | ICD-10-CM | POA: Insufficient documentation

## 2021-02-02 DIAGNOSIS — E1169 Type 2 diabetes mellitus with other specified complication: Secondary | ICD-10-CM | POA: Insufficient documentation

## 2021-02-02 DIAGNOSIS — I5033 Acute on chronic diastolic (congestive) heart failure: Secondary | ICD-10-CM | POA: Insufficient documentation

## 2021-02-02 DIAGNOSIS — S065XAA Traumatic subdural hemorrhage with loss of consciousness status unknown, initial encounter: Secondary | ICD-10-CM | POA: Diagnosis not present

## 2021-02-02 DIAGNOSIS — F801 Expressive language disorder: Secondary | ICD-10-CM | POA: Insufficient documentation

## 2021-02-02 LAB — CBC
HCT: 39.8 % (ref 39.0–52.0)
Hemoglobin: 12.8 g/dL — ABNORMAL LOW (ref 13.0–17.0)
MCH: 30.2 pg (ref 26.0–34.0)
MCHC: 32.2 g/dL (ref 30.0–36.0)
MCV: 93.9 fL (ref 80.0–100.0)
Platelets: 222 10*3/uL (ref 150–400)
RBC: 4.24 MIL/uL (ref 4.22–5.81)
RDW: 13.5 % (ref 11.5–15.5)
WBC: 7.3 10*3/uL (ref 4.0–10.5)
nRBC: 0 % (ref 0.0–0.2)

## 2021-02-02 LAB — BASIC METABOLIC PANEL
Anion gap: 8 (ref 5–15)
BUN: 39 mg/dL — ABNORMAL HIGH (ref 8–23)
CO2: 26 mmol/L (ref 22–32)
Calcium: 8.8 mg/dL — ABNORMAL LOW (ref 8.9–10.3)
Chloride: 104 mmol/L (ref 98–111)
Creatinine, Ser: 2.04 mg/dL — ABNORMAL HIGH (ref 0.61–1.24)
GFR, Estimated: 32 mL/min — ABNORMAL LOW (ref >60)
Glucose, Bld: 261 mg/dL — ABNORMAL HIGH (ref 70–99)
Potassium: 3.5 mmol/L (ref 3.5–5.1)
Sodium: 138 mmol/L (ref 135–145)

## 2021-02-02 NOTE — ED Triage Notes (Signed)
Pt presents to ER from home.  Per wife, pt has become increasingly lethargic and not talking normally since being taken off eliquis on Tuesday.  Pt has hx of stable head bleed and was taken off eliquis d/t chronic bleed. Pt A&O x3 at this time.

## 2021-02-03 ENCOUNTER — Other Ambulatory Visit: Payer: Self-pay | Admitting: Neurosurgery

## 2021-02-03 ENCOUNTER — Emergency Department: Payer: Medicare HMO

## 2021-02-03 ENCOUNTER — Emergency Department
Admission: EM | Admit: 2021-02-03 | Discharge: 2021-02-03 | Disposition: A | Discharge status: Home / Self Care | Payer: Medicare HMO | Source: Home / Self Care | Attending: Emergency Medicine | Admitting: Emergency Medicine

## 2021-02-03 DIAGNOSIS — R29818 Other symptoms and signs involving the nervous system: Secondary | ICD-10-CM

## 2021-02-03 DIAGNOSIS — R4701 Aphasia: Secondary | ICD-10-CM

## 2021-02-03 DIAGNOSIS — S065XAA Traumatic subdural hemorrhage with loss of consciousness status unknown, initial encounter: Secondary | ICD-10-CM

## 2021-02-03 MED ORDER — GADOBUTROL 1 MMOL/ML IV SOLN: 10.0000 mL | Freq: Once | INTRAVENOUS | Status: DC | PRN

## 2021-02-03 NOTE — ED Provider Notes (Signed)
[facility] Emergency Department Provider Note  ____________________________________________   Event Date/Time   First MD Initiated Contact with Patient 02/03/21 0200     (approximate)  I have reviewed the triage vital signs and the nursing notes.   HISTORY  Chief Complaint Altered Mental Status and Weakness  Level 5 caveat: Patient's ability to provide history is limited by expressive aphasia.  History is provided by family member.  HPI Kyle Roberson is a 80 y.o. 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  Family reports that the patient developed some difficulty holding a pencil in his right dominant hand last week.  When he came to the ED he had a CT scan that confirmed some old bleeding but also some acute subdural bleeding.  He has not had a recent fall and his last head injury was back in August when he had a normal CT scan.  Neurosurgery was consulted and the recommendation was for repeat head CT.  The interval head CT on the visit last week was unchanged so he was discharged for outpatient follow-up with Dr. Izora Ribas.  Over the last 3 to 4 days, the patient's family member reports that his weakness and lack of coordination in his hands in particular his dominant hand has gotten worse.  Additionally he has started developing speech difficulties and by tonight he has having fairly consistent difficulty expressing his words although he clearly understands what is being said.  The difficulty expressing himself is causing a great deal of frustration.  He is denying pain.  He has had no recent fever or neck pain/stiffness.  The symptoms have been gradual in onset over about 4 days but have become severe.  Nothing in particular makes them better or worse.     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 04/28/2019   CKD (chronic kidney disease) stage 4, GFR 15-29 ml/min (HCC) 04/27/2019   Chronic heart failure with preserved ejection fraction (HFpEF) ([place]) 04/27/2019   Morbid obesity ([place]) 04/27/2019   Lymphedema 03/23/2019   Erectile dysfunction 08/01/2018   History of colonic diverticulitis 08/01/2018   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   Obesity (BMI 30-39.9) 08/01/2018   Venous insufficiency 08/01/2018   Persistent atrial fibrillation ([place]) 08/01/2018   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   Essential hypertension 07/06/2018   Type II diabetes mellitus with complication ([place]) 65/05/5463   Atherosclerotic heart disease of native coronary 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 ([place]) 06/05/2018   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 Authorizing Provider  Ascorbic Acid (VITAMIN C) 1000 MG tablet Take 1,000 mg by mouth daily.    [provider]   cholecalciferol (VITAMIN D3) 25 MCG (1000 UT) tablet Take 2,000 Units by mouth daily.     [provider]  dorzolamide-timolol (COSOPT) 22.3-6.8 MG/ML ophthalmic solution Place 1 drop into the left eye 2 (two) times daily. 08/28/20   [provider]  ELIQUIS 2.5 MG TABS tablet TAKE 1 TABLET(2.5 MG) BY MOUTH TWICE DAILY 01/14/21   Minna Merritts, MD  erythromycin ophthalmic ointment as needed. 09/03/20   [provider]  glipiZIDE (GLUCOTROL XL) 10 MG 24 hr tablet TAKE 1 TABLET(10 MG) BY MOUTH TWICE DAILY 01/14/21   Glean Hess, MD  hydrALAZINE (APRESOLINE) 100 MG tablet Take 1 tablet (100 mg total) by mouth 3 (three) times daily. 06/11/20   Minna Merritts, MD  insulin NPH Human (NOVOLIN N) 100 UNIT/ML injection Inject 10-15 Units into the skin daily. Take 10-15 units twice a day after breakfast and before bedtime per Sliding Scale (BS>250: 15 units, BS= 200-250:10 units) 11/30/17   [provider]  levETIRAcetam (KEPPRA) 500 MG tablet Take 1 tablet (500 mg total) by mouth 2 (two) times daily for 7 days. 01/29/21 02/05/21  Nance Pear, MD  MAXITROL 3.5-10000-0.1 [place] 1 application into the left eye 2 (two) times daily.  05/03/19   [provider]  metolazone (ZAROXOLYN) 2.5 MG tablet TAKE 1 TABLET BY MOUTH EVERY DAY AS NEEDED FOR NLZJQ>734 06/12/20   Minna Merritts, MD  Multiple Vitamins-Minerals (MULTIVITAMIN WITH MINERALS) tablet Take 1 tablet by mouth daily.    [provider]  nitroGLYCERIN (NITROLINGUAL) 0.4 MG/SPRAY spray Place 1 spray under the tongue as directed. 07/19/19 12/30/20  Glean Hess, MD  Omega 3 1000 MG CAPS Take 1 capsule by mouth daily. 04/14/06   [provider]  potassium chloride SA (KLOR-CON) 20 MEQ tablet Take 1 tablet (20 mEq total) by mouth daily as needed. 06/11/20   Minna Merritts, MD  rosuvastatin (CRESTOR) 5 MG tablet TAKE 1 TABLET BY MOUTH 3 TIMES A WEEK 01/14/21   Glean Hess,  MD  torsemide (DEMADEX) 20 MG tablet Take 3 tablets (60 mg total) by mouth daily. 06/11/20   Minna Merritts, MD  UNABLE TO FIND Med Name: qunol tumeric extra str. For joints and inflammation    [provider]    Allergies Atorvastatin and Simvastatin  Family History  Problem Relation Age of Onset   Pancreatic cancer Mother    CAD Father    Diabetes Brother     Social History Social History   Tobacco Use   Smoking status: Former   Smokeless tobacco: Never  Scientific laboratory technician Use: Never used  Substance Use Topics   Alcohol use: Not Currently    Alcohol/week: 2.0 standard drinks    Types: 2 Glasses of wine per week   Drug use: Not Currently    Review of Systems Level 5 caveat: Patient's ability to provide history is limited by expressive aphasia.  History is provided by family member.  Constitutional: No fever/chills Eyes: No visual changes. ENT: No sore throat. Cardiovascular: Denies  chest pain. Respiratory: Denies shortness of breath. Gastrointestinal: No abdominal pain.  No nausea, no vomiting.  No diarrhea.  No constipation. Genitourinary: Negative for dysuria. Musculoskeletal: Negative for neck pain.  Negative for back pain. Integumentary: Negative for rash. Neurological: Gradually worsening difficulty with coordination in his hands.  Generalized weakness throughout.  Expressive aphasia.   ____________________________________________   PHYSICAL EXAM:  VITAL SIGNS: ED Triage Vitals  Enc Vitals Group     BP 02/02/21 2137 (!) 154/67     Pulse Rate 02/02/21 2137 64     Resp 02/02/21 2201 16     Temp 02/02/21 2137 97.9 F (36.6 C)     Temp Source 02/02/21 2137 Oral     SpO2 02/02/21 2137 97 %     Weight 02/02/21 2138 104.3 kg (230 lb)     Height 02/02/21 2138 1.727 m (5\' 8" )     Head Circumference --      Peak Flow --      Pain Score 02/02/21 2158 0     Pain Loc --      Pain Edu? --      Excl. in [place]? --     Constitutional: Awake and  alert. Eyes: Conjunctivae are normal.  No nystagmus. Head: Atraumatic. Nose: No congestion/rhinnorhea. Neck: No stridor.  No meningeal signs.   Cardiovascular: Normal rate, regular rhythm. Good peripheral circulation. Respiratory: Normal respiratory effort.  No retractions. Gastrointestinal: Soft and nontender. No distention.  Musculoskeletal: No lower extremity tenderness nor edema. No gross deformities of extremities. Neurologic: Patient has variable difficulty with expressing himself (expressive aphasia).  Sometimes he is able to do so and other times he cannot find the correct words or expresses the wrong words.  He has no facial droop, no evidence of cranial nerve deficits in general.  Straight tongue protrusion on command.  Decreased coordination and strength in bilateral upper extremities but most notable in his right hand.  Patient also appears to have generalized weakness in lower extremities but no obvious deficit on 1 side versus the other.  Did not test ambulation. Skin:  Skin is warm, dry and intact. Psychiatric: Mood and affect are normal. Speech and behavior are normal.  NIH Stroke Scale  Interval: Baseline Time: 6:10 AM Person Administering Scale: Hinda Kehr  1a  Level of consciousness: 0=alert; keenly responsive  1b. LOC questions:  0=Performs both tasks correctly  1c. LOC commands: 0=Performs both tasks correctly  2.  Best Gaze: 0=normal  3.  Visual: 0=No visual loss  4. Facial Palsy: 0=Normal symmetric movement  5a.  Motor left arm: 0=No drift, limb holds 90 (or 45) degrees for full 10 seconds  5b.  Motor right arm: 1=Drift, limb holds 90 (or 45) degrees but drifts down before full 10 seconds: does not hit bed  6a. motor left leg: 0=No drift, limb holds 90 (or 45) degrees for full 10 seconds  6b  Motor right leg:  0=No drift, limb holds 90 (or 45) degrees for full 10 seconds  7. Limb Ataxia: 0=Absent  8.  Sensory: 0=Normal; no sensory loss  9. Best Language:   1=Mild to moderate aphasia; some obvious loss of fluency or facility of comprehension without significant limitation on ideas expressed or form of expression.  10. Dysarthria: 1=Mild to moderate, patient slurs at least some words and at worst, can be understood with some difficulty  11. Extinction and Inattention: 0=No abnormality  12. Distal motor function: 0=Normal   Total:   3   Is  Karma Greaser ____________________________________________   LABS (all labs ordered are listed, but only abnormal results are displayed)  Labs Reviewed  CBC - Abnormal; Notable for the following components:      Result Value   Hemoglobin 12.8 (*)    All other components within normal limits  BASIC METABOLIC PANEL - Abnormal; Notable for the following components:   Glucose, Bld 261 (*)    BUN 39 (*)    Creatinine, Ser 2.04 (*)    Calcium 8.8 (*)    GFR, Estimated 32 (*)    All other components within normal limits   ____________________________________________  EKG  ED ECG REPORT I, Hinda Kehr, the attending physician, personally viewed and interpreted this ECG.  Date: 02/02/2021 EKG Time: 21: 45 Rate: 64 Rhythm: Atrial fibrillation QRS Axis: Right axis deviation Intervals: Abnormal due to atrial fibrillation, also incomplete right bundle branch block ST/T Wave abnormalities: Non-specific ST segment / T-wave changes, but no clear evidence of acute ischemia. Narrative Interpretation: no definitive evidence of acute ischemia; does not meet STEMI criteria.  ____________________________________________  RADIOLOGY I, Hinda Kehr, personally viewed and evaluated these images (plain radiographs) as part of my medical decision making, as well as reviewing the written report by the radiologist.  ED MD interpretation: No interval changes on head CT.  MRI shows no acute findings but with persistent subdural hematoma with a degree of mass-effect and midline shift.  Also has a partially obscured ICA  dissection  Official radiology report(s): CT HEAD WO CONTRAST (5MM)  Result Date: 02/02/2021 CLINICAL DATA:  Follow-up examination for mental status change, known head bleed. EXAM: CT HEAD WITHOUT CONTRAST TECHNIQUE: Contiguous axial images were obtained from the base of the skull through the vertex without intravenous contrast. COMPARISON:  Prior head CT from 01/29/2021. FINDINGS: Brain: Mixed density extra-axial hemorrhage overlying the left cerebral convexity is not significantly changed measuring up to 1.7 cm in maximal thickness. Associated mass effect with 5 mm of left-to-right shift, stable. No evidence for new or interval bleeding. Underlying atrophy with chronic small vessel ischemic disease again noted. No other acute intracranial hemorrhage. No visible acute large vessel territory infarct. No mass lesion or hydrocephalus. Basilar cisterns remain patent. Vascular: No hyperdense vessel. Scattered vascular calcifications noted within the carotid siphons. Skull: Scalp soft tissues and calvarium demonstrate no new abnormality. Sinuses/Orbits: Globes and orbital soft tissues demonstrate no acute finding. Visualized paranasal sinuses and mastoid air cells are clear. Other: None. IMPRESSION: 1. No significant interval change in size and appearance of mixed density extra-axial hemorrhage overlying the left cerebral convexity with associated mass effect and 5 mm of left-to-right shift. No evidence for new or interval bleeding. 2. No other new acute intracranial abnormality. 3. Underlying atrophy with chronic small vessel ischemic disease. Electronically Signed   By: Jeannine Boga M.D.   On: 02/02/2021 22:40    ____________________________________________   PROCEDURES   Procedure(s) performed (including Critical Care):  Procedures   ____________________________________________   INITIAL IMPRESSION / MDM / ASSESSMENT AND PLAN / ED COURSE  As part of my medical decision making, I  reviewed the following data within the [place] History obtained from family, Nursing notes reviewed and incorporated, Labs reviewed , EKG interpreted , Old chart reviewed, Discussed with radiologist, and Notes from prior ED visits   Differential diagnosis includes, but is not limited to, CVA, worsening intracranial hemorrhage, acute infection.  Vital signs are stable.  No interval changes on head CT.  Stable chronic kidney disease, CBC and  metabolic panel otherwise unremarkable.  However his gradually worsening neurological symptoms are concerning, particularly his worsening expressive aphasia.  It is intermittent but he is definitely having issues that he did not have before.  Additionally the use of his right arm has decompensated over the last several days as well.  I will call and discussed the case with radiology but I believe the patient would benefit from an MRI.  I discussed with the patient and the family and they understand and agree with the plan.       Clinical Course as of 02/03/21 0258  [facility] Feb 03, 2021  0223 Discussed case by phone with radiology (Dr. Jeannine Boga).  We discussed the current signs/symptoms, and Dr. Jeannine Boga recommended MRI Brain wo contrast, MRA Head wo contrast, and MRA neck wo contrast.  Patient and family aware of plan. [CF]  5277 Of note, I believe the patient will need to be admitted to the hospital for neurology evaluation, but he is outside the window for tPA and recently had a brain bleed which completely excludes him from tPA.  However if he has an intervene of the lesion found on MRA, he may benefit from being transferred to a different facility.  We will wait definitive disposition until after the imaging is back. [CF]  [id number] I reviewed the MRI results which are available through PACS but not currently crossing over into [facility].  There is no evidence of acute CVA.  In fact there are no acute findings or no significant interval changes since  his last imaging when he was first diagnosed with the intracranial bleeding.  However he does have an area of hematoma with some degree of mass-effect which may be causing his symptoms.  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  I updated the patient and his family member about the findings and explained that my recommendation is that we admitted to the hospital for evaluation by neurology and neurosurgery, in spite of the fact that there do not seem to be any acute events.  Given that he has been having worsening neurological symptoms over the last few days, however that seems to be a reasonable option.  However the patient is rather adamant that he wants to go home.  We talked about this for awhile, and given no acute or emergent findings tonight, I agreed that it would be reasonable for him to go home but I strongly encouraged them to return immediately to the emergency department with the development of any new or worsening symptoms.  I am also sending a message to both Dr. Izora Ribas, with whom he has an appointment in 4 days, and Dr. Lacinda Axon, who was on-call for neurosurgery last night, to let them know about the findings and to see if it is possible for the patient to be seen in clinic before his currently scheduled appointment.  And family understand agree with the plan and will bring him back immediately if he develops any worsening symptoms.  The patient is in good spirits at the time of discharge, states that he understands what is going on, and of note, currently is having no expressive aphasia and is able to speak clearly and in full sentences. [CF]    Clinical Course User Index [CF] Hinda Kehr, MD     ____________________________________________  FINAL CLINICAL IMPRESSION(S) / ED DIAGNOSES  Final diagnoses:  Expressive aphasia  Focal neurological deficit  Subdural hematoma     MEDICATIONS GIVEN DURING  THIS VISIT:  Medications   gadobutrol (GADAVIST) 1 MMOL/ML injection 10 mL (has no administration in time range)     ED Discharge Orders     None        Note:  This document was prepared using Dragon voice recognition software and may include unintentional dictation errors.   Hinda Kehr, MD 02/03/21 [phone]

## 2021-02-03 NOTE — ED Notes (Signed)
Up to bathroom to void with assist.  Back to subwait and in recliner with faminly member at his side.

## 2021-02-03 NOTE — Discharge Instructions (Signed)
As we discussed, there does not appear to be anything new that has happened such as a stroke, but your neurological symptoms are most likely the result of the persistent subdural hematoma that is putting pressure on your brain.  We recommended admitting you to the hospital for further evaluation by the specialists (neurosurgery and/or neurology), but you prefer to go home at this time.  Please know that you should return immediately to the emergency department if your symptoms get any worse.  We are sending a message to the neurosurgeons to let them know about the situation and they will contact you if there is an appointment available before your currently scheduled appointment on Friday.  Again, please return immediately to the emergency department if you develop new or worsening symptoms.  Continue to hold off on resuming your Eliquis until told otherwise by Dr. Izora Ribas or one of his colleagues.

## 2021-02-04 ENCOUNTER — Other Ambulatory Visit: Payer: Self-pay

## 2021-02-04 ENCOUNTER — Inpatient Hospital Stay: Payer: Medicare HMO | Admitting: Registered Nurse

## 2021-02-04 ENCOUNTER — Inpatient Hospital Stay
Admission: RE | Admit: 2021-02-04 | Discharge: 2021-02-11 | DRG: 026 | Disposition: A | Discharge status: Rehabilitation Facility | Payer: Medicare HMO | Attending: Neurosurgery | Admitting: Neurosurgery

## 2021-02-04 ENCOUNTER — Encounter
Admission: RE | Disposition: A | Discharge status: Other Acute Inpatient Hospital | Payer: Self-pay | Source: Home / Self Care | Attending: Neurosurgery

## 2021-02-04 ENCOUNTER — Encounter: Payer: Self-pay | Admitting: Neurosurgery

## 2021-02-04 DIAGNOSIS — H348122 Central retinal vein occlusion, left eye, stable: Secondary | ICD-10-CM | POA: Diagnosis present

## 2021-02-04 DIAGNOSIS — E669 Obesity, unspecified: Secondary | ICD-10-CM | POA: Diagnosis not present

## 2021-02-04 DIAGNOSIS — I4819 Other persistent atrial fibrillation: Secondary | ICD-10-CM | POA: Diagnosis not present

## 2021-02-04 DIAGNOSIS — I10 Essential (primary) hypertension: Secondary | ICD-10-CM | POA: Diagnosis present

## 2021-02-04 DIAGNOSIS — I5033 Acute on chronic diastolic (congestive) heart failure: Secondary | ICD-10-CM | POA: Diagnosis present

## 2021-02-04 DIAGNOSIS — R001 Bradycardia, unspecified: Secondary | ICD-10-CM | POA: Diagnosis present

## 2021-02-04 DIAGNOSIS — E1142 Type 2 diabetes mellitus with diabetic polyneuropathy: Secondary | ICD-10-CM | POA: Diagnosis present

## 2021-02-04 DIAGNOSIS — Z87891 Personal history of nicotine dependence: Secondary | ICD-10-CM

## 2021-02-04 DIAGNOSIS — Z20822 Contact with and (suspected) exposure to covid-19: Secondary | ICD-10-CM | POA: Diagnosis present

## 2021-02-04 DIAGNOSIS — S065XAA Traumatic subdural hemorrhage with loss of consciousness status unknown, initial encounter: Principal | ICD-10-CM | POA: Diagnosis present

## 2021-02-04 DIAGNOSIS — H5462 Unqualified visual loss, left eye, normal vision right eye: Secondary | ICD-10-CM | POA: Diagnosis present

## 2021-02-04 DIAGNOSIS — I2510 Atherosclerotic heart disease of native coronary artery without angina pectoris: Secondary | ICD-10-CM | POA: Diagnosis present

## 2021-02-04 DIAGNOSIS — S065X5D Traumatic subdural hemorrhage with loss of consciousness greater than 24 hours with return to pre-existing conscious level, subsequent encounter: Secondary | ICD-10-CM | POA: Diagnosis not present

## 2021-02-04 DIAGNOSIS — N184 Chronic kidney disease, stage 4 (severe): Secondary | ICD-10-CM | POA: Diagnosis present

## 2021-02-04 DIAGNOSIS — R402252 Coma scale, best verbal response, oriented, at arrival to emergency department: Secondary | ICD-10-CM | POA: Diagnosis present

## 2021-02-04 DIAGNOSIS — I4821 Permanent atrial fibrillation: Secondary | ICD-10-CM | POA: Diagnosis present

## 2021-02-04 DIAGNOSIS — Z888 Allergy status to other drugs, medicaments and biological substances status: Secondary | ICD-10-CM

## 2021-02-04 DIAGNOSIS — Z7984 Long term (current) use of oral hypoglycemic drugs: Secondary | ICD-10-CM

## 2021-02-04 DIAGNOSIS — E1165 Type 2 diabetes mellitus with hyperglycemia: Secondary | ICD-10-CM | POA: Diagnosis present

## 2021-02-04 DIAGNOSIS — I5032 Chronic diastolic (congestive) heart failure: Secondary | ICD-10-CM | POA: Diagnosis present

## 2021-02-04 DIAGNOSIS — E1122 Type 2 diabetes mellitus with diabetic chronic kidney disease: Secondary | ICD-10-CM | POA: Diagnosis present

## 2021-02-04 DIAGNOSIS — Z833 Family history of diabetes mellitus: Secondary | ICD-10-CM

## 2021-02-04 DIAGNOSIS — W19XXXA Unspecified fall, initial encounter: Secondary | ICD-10-CM | POA: Diagnosis present

## 2021-02-04 DIAGNOSIS — K219 Gastro-esophageal reflux disease without esophagitis: Secondary | ICD-10-CM | POA: Diagnosis present

## 2021-02-04 DIAGNOSIS — N401 Enlarged prostate with lower urinary tract symptoms: Secondary | ICD-10-CM | POA: Diagnosis present

## 2021-02-04 DIAGNOSIS — R402362 Coma scale, best motor response, obeys commands, at arrival to emergency department: Secondary | ICD-10-CM | POA: Diagnosis present

## 2021-02-04 DIAGNOSIS — I482 Chronic atrial fibrillation, unspecified: Secondary | ICD-10-CM | POA: Diagnosis not present

## 2021-02-04 DIAGNOSIS — N1832 Chronic kidney disease, stage 3b: Secondary | ICD-10-CM | POA: Diagnosis not present

## 2021-02-04 DIAGNOSIS — Z955 Presence of coronary angioplasty implant and graft: Secondary | ICD-10-CM

## 2021-02-04 DIAGNOSIS — Z6834 Body mass index (BMI) 34.0-34.9, adult: Secondary | ICD-10-CM

## 2021-02-04 DIAGNOSIS — Z794 Long term (current) use of insulin: Secondary | ICD-10-CM

## 2021-02-04 DIAGNOSIS — G8191 Hemiplegia, unspecified affecting right dominant side: Secondary | ICD-10-CM | POA: Diagnosis present

## 2021-02-04 DIAGNOSIS — E785 Hyperlipidemia, unspecified: Secondary | ICD-10-CM | POA: Diagnosis present

## 2021-02-04 DIAGNOSIS — S065X5S Traumatic subdural hemorrhage with loss of consciousness greater than 24 hours with return to pre-existing conscious level, sequela: Secondary | ICD-10-CM | POA: Diagnosis not present

## 2021-02-04 DIAGNOSIS — Z7901 Long term (current) use of anticoagulants: Secondary | ICD-10-CM

## 2021-02-04 DIAGNOSIS — E118 Type 2 diabetes mellitus with unspecified complications: Secondary | ICD-10-CM | POA: Diagnosis present

## 2021-02-04 DIAGNOSIS — Z8679 Personal history of other diseases of the circulatory system: Secondary | ICD-10-CM | POA: Diagnosis present

## 2021-02-04 DIAGNOSIS — R402142 Coma scale, eyes open, spontaneous, at arrival to emergency department: Secondary | ICD-10-CM | POA: Diagnosis present

## 2021-02-04 DIAGNOSIS — Z79899 Other long term (current) drug therapy: Secondary | ICD-10-CM

## 2021-02-04 DIAGNOSIS — R471 Dysarthria and anarthria: Secondary | ICD-10-CM | POA: Diagnosis present

## 2021-02-04 DIAGNOSIS — R4701 Aphasia: Secondary | ICD-10-CM | POA: Diagnosis present

## 2021-02-04 DIAGNOSIS — Z8249 Family history of ischemic heart disease and other diseases of the circulatory system: Secondary | ICD-10-CM

## 2021-02-04 DIAGNOSIS — I130 Hypertensive heart and chronic kidney disease with heart failure and stage 1 through stage 4 chronic kidney disease, or unspecified chronic kidney disease: Secondary | ICD-10-CM | POA: Diagnosis present

## 2021-02-04 DIAGNOSIS — H5440 Blindness, one eye, unspecified eye: Secondary | ICD-10-CM | POA: Diagnosis present

## 2021-02-04 DIAGNOSIS — E1169 Type 2 diabetes mellitus with other specified complication: Secondary | ICD-10-CM | POA: Diagnosis not present

## 2021-02-04 HISTORY — PX: CRANIOTOMY: SHX93

## 2021-02-04 LAB — URINALYSIS, ROUTINE W REFLEX MICROSCOPIC
Bacteria, UA: NONE SEEN
Bilirubin Urine: NEGATIVE
Glucose, UA: NEGATIVE mg/dL
Hgb urine dipstick: NEGATIVE
Ketones, ur: NEGATIVE mg/dL
Leukocytes,Ua: NEGATIVE
Nitrite: NEGATIVE
Protein, ur: 100 mg/dL — AB
Specific Gravity, Urine: 1.014 (ref 1.005–1.030)
pH: 5 (ref 5.0–8.0)

## 2021-02-04 LAB — GLUCOSE, CAPILLARY
Glucose-Capillary: 129 mg/dL — ABNORMAL HIGH (ref 70–99)
Glucose-Capillary: 118 mg/dL — ABNORMAL HIGH (ref 70–99)

## 2021-02-04 LAB — SURGICAL PCR SCREEN
MRSA, PCR: NEGATIVE
Staphylococcus aureus: NEGATIVE

## 2021-02-04 LAB — MRSA NEXT GEN BY PCR, NASAL: MRSA by PCR Next Gen: NOT DETECTED

## 2021-02-04 LAB — TYPE AND SCREEN
ABO/RH(D): O POS
Antibody Screen: NEGATIVE

## 2021-02-04 LAB — ABO/RH: ABO/RH(D): O POS

## 2021-02-04 SURGERY — CRANIOTOMY HEMATOMA EVACUATION SUBDURAL
Anesthesia: General | Laterality: Left

## 2021-02-04 MED ORDER — CHLORHEXIDINE GLUCONATE CLOTH 2 % EX PADS
6.0000 | MEDICATED_PAD | Freq: Every day | CUTANEOUS | Status: DC
  Administered 2021-02-04 – 2021-02-11 (×6): 6 via TOPICAL

## 2021-02-04 MED ORDER — LIDOCAINE HCL (CARDIAC) PF 100 MG/5ML IV SOSY
PREFILLED_SYRINGE | INTRAVENOUS | Status: DC | PRN
  Administered 2021-02-04: 100 mg via INTRAVENOUS

## 2021-02-04 MED ORDER — NALOXONE HCL 0.4 MG/ML IJ SOLN: 0.0800 mg | INTRAMUSCULAR | Status: DC | PRN

## 2021-02-04 MED ORDER — LEVETIRACETAM 500 MG/5ML IV SOLN
INTRAVENOUS | Status: AC
  Filled 2021-02-04: qty 10

## 2021-02-04 MED ORDER — REMIFENTANIL HCL 1 MG IV SOLR
INTRAVENOUS | Status: AC
  Filled 2021-02-04: qty 1000

## 2021-02-04 MED ORDER — OXYCODONE HCL 5 MG PO TABS
5.0000 mg | ORAL_TABLET | Freq: Four times a day (QID) | ORAL | Status: DC | PRN
  Administered 2021-02-05 – 2021-02-06 (×2): 5 mg via ORAL
  Filled 2021-02-04 (×2): qty 1

## 2021-02-04 MED ORDER — ROCURONIUM BROMIDE 100 MG/10ML IV SOLN
INTRAVENOUS | Status: DC | PRN
  Administered 2021-02-04: 20 mg via INTRAVENOUS
  Administered 2021-02-04: 40 mg via INTRAVENOUS

## 2021-02-04 MED ORDER — SENNA 8.6 MG PO TABS
1.0000 | ORAL_TABLET | Freq: Two times a day (BID) | ORAL | Status: DC
  Administered 2021-02-06 – 2021-02-11 (×9): 8.6 mg via ORAL
  Filled 2021-02-04 (×12): qty 1

## 2021-02-04 MED ORDER — ONDANSETRON HCL 4 MG PO TABS: 4.0000 mg | ORAL_TABLET | ORAL | Status: DC | PRN

## 2021-02-04 MED ORDER — ACETAMINOPHEN 650 MG RE SUPP: 650.0000 mg | RECTAL | Status: DC | PRN

## 2021-02-04 MED ORDER — ORAL CARE MOUTH RINSE: 15.0000 mL | Freq: Once | OROMUCOSAL | Status: AC

## 2021-02-04 MED ORDER — LIDOCAINE-EPINEPHRINE 1 %-1:100000 IJ SOLN
INTRAMUSCULAR | Status: AC
  Filled 2021-02-04: qty 1

## 2021-02-04 MED ORDER — PROMETHAZINE HCL 25 MG PO TABS
12.5000 mg | ORAL_TABLET | ORAL | Status: DC | PRN
  Filled 2021-02-04: qty 1

## 2021-02-04 MED ORDER — ONDANSETRON HCL 4 MG/2ML IJ SOLN
INTRAMUSCULAR | Status: DC | PRN
  Administered 2021-02-04: 4 mg via INTRAVENOUS

## 2021-02-04 MED ORDER — LACTATED RINGERS IV SOLN: INTRAVENOUS | Status: DC

## 2021-02-04 MED ORDER — MIDAZOLAM HCL 2 MG/2ML IJ SOLN
INTRAMUSCULAR | Status: AC
  Filled 2021-02-04: qty 2

## 2021-02-04 MED ORDER — FENTANYL CITRATE (PF) 100 MCG/2ML IJ SOLN
INTRAMUSCULAR | Status: DC | PRN
  Administered 2021-02-04 (×2): 25 ug via INTRAVENOUS

## 2021-02-04 MED ORDER — INSULIN NPH (HUMAN) (ISOPHANE) 100 UNIT/ML ~~LOC~~ SUSP
10.0000 [IU] | Freq: Every day | SUBCUTANEOUS | Status: DC
  Filled 2021-02-04: qty 10

## 2021-02-04 MED ORDER — CEFAZOLIN SODIUM-DEXTROSE 2-4 GM/100ML-% IV SOLN
2.0000 g | INTRAVENOUS | Status: AC
  Administered 2021-02-04: 2 g via INTRAVENOUS

## 2021-02-04 MED ORDER — FLEET ENEMA 7-19 GM/118ML RE ENEM: 1.0000 | ENEMA | Freq: Once | RECTAL | Status: DC | PRN

## 2021-02-04 MED ORDER — THROMBIN 5000 UNITS EX SOLR
CUTANEOUS | Status: AC
  Filled 2021-02-04: qty 5000

## 2021-02-04 MED ORDER — PROPOFOL 10 MG/ML IV BOLUS
INTRAVENOUS | Status: DC | PRN
  Administered 2021-02-04: 150 mg via INTRAVENOUS
  Administered 2021-02-04: 30 mg via INTRAVENOUS

## 2021-02-04 MED ORDER — OXYCODONE HCL 5 MG PO TABS: 5.0000 mg | ORAL_TABLET | Freq: Once | ORAL | Status: DC | PRN

## 2021-02-04 MED ORDER — ACETAMINOPHEN 10 MG/ML IV SOLN
INTRAVENOUS | Status: DC | PRN
  Administered 2021-02-04: 1000 mg via INTRAVENOUS

## 2021-02-04 MED ORDER — GELATIN ABSORBABLE 100 CM EX MISC
CUTANEOUS | Status: AC
  Filled 2021-02-04: qty 1

## 2021-02-04 MED ORDER — ONDANSETRON HCL 4 MG/2ML IJ SOLN: 4.0000 mg | INTRAMUSCULAR | Status: DC | PRN

## 2021-02-04 MED ORDER — BISACODYL 10 MG RE SUPP: 10.0000 mg | Freq: Every day | RECTAL | Status: DC | PRN

## 2021-02-04 MED ORDER — ACETAMINOPHEN 325 MG PO TABS
650.0000 mg | ORAL_TABLET | ORAL | Status: DC | PRN
  Administered 2021-02-05 – 2021-02-08 (×2): 650 mg via ORAL
  Filled 2021-02-04 (×2): qty 2

## 2021-02-04 MED ORDER — OXYCODONE HCL 5 MG/5ML PO SOLN: 5.0000 mg | Freq: Once | ORAL | Status: DC | PRN

## 2021-02-04 MED ORDER — DORZOLAMIDE HCL-TIMOLOL MAL 2-0.5 % OP SOLN
1.0000 [drp] | Freq: Two times a day (BID) | OPHTHALMIC | Status: DC
  Administered 2021-02-04 – 2021-02-11 (×14): 1 [drp] via OPHTHALMIC
  Filled 2021-02-04: qty 10

## 2021-02-04 MED ORDER — LEVETIRACETAM 500 MG PO TABS
500.0000 mg | ORAL_TABLET | Freq: Two times a day (BID) | ORAL | Status: DC
  Administered 2021-02-04 – 2021-02-10 (×13): 500 mg via ORAL
  Filled 2021-02-04 (×14): qty 1

## 2021-02-04 MED ORDER — DEXAMETHASONE SODIUM PHOSPHATE 10 MG/ML IJ SOLN
INTRAMUSCULAR | Status: DC | PRN
  Administered 2021-02-04: 10 mg via INTRAVENOUS

## 2021-02-04 MED ORDER — PHENYLEPHRINE HCL-NACL 20-0.9 MG/250ML-% IV SOLN
INTRAVENOUS | Status: DC | PRN
  Administered 2021-02-04: 20 ug/min via INTRAVENOUS

## 2021-02-04 MED ORDER — CEFAZOLIN SODIUM-DEXTROSE 2-4 GM/100ML-% IV SOLN
INTRAVENOUS | Status: AC
  Filled 2021-02-04: qty 100

## 2021-02-04 MED ORDER — HYDRALAZINE HCL 50 MG PO TABS
100.0000 mg | ORAL_TABLET | Freq: Three times a day (TID) | ORAL | Status: DC
  Administered 2021-02-04 – 2021-02-11 (×19): 100 mg via ORAL
  Filled 2021-02-04 (×20): qty 2

## 2021-02-04 MED ORDER — LABETALOL HCL 5 MG/ML IV SOLN: 10.0000 mg | INTRAVENOUS | Status: DC | PRN

## 2021-02-04 MED ORDER — SODIUM CHLORIDE 0.9 % IV SOLN: INTRAVENOUS | Status: DC

## 2021-02-04 MED ORDER — TORSEMIDE 20 MG PO TABS
60.0000 mg | ORAL_TABLET | Freq: Every day | ORAL | Status: DC
  Administered 2021-02-05 – 2021-02-11 (×7): 60 mg via ORAL
  Filled 2021-02-04 (×7): qty 3

## 2021-02-04 MED ORDER — FENTANYL CITRATE (PF) 100 MCG/2ML IJ SOLN
INTRAMUSCULAR | Status: AC
  Filled 2021-02-04: qty 2

## 2021-02-04 MED ORDER — BUTALBITAL-APAP-CAFFEINE 50-325-40 MG PO TABS: 1.0000 | ORAL_TABLET | ORAL | Status: DC | PRN

## 2021-02-04 MED ORDER — CHLORHEXIDINE GLUCONATE 0.12 % MT SOLN
OROMUCOSAL | Status: AC
  Administered 2021-02-04: 15 mL via OROMUCOSAL
  Filled 2021-02-04: qty 15

## 2021-02-04 MED ORDER — FENTANYL CITRATE (PF) 100 MCG/2ML IJ SOLN: 25.0000 ug | INTRAMUSCULAR | Status: DC | PRN

## 2021-02-04 MED ORDER — ACETAMINOPHEN 10 MG/ML IV SOLN
INTRAVENOUS | Status: AC
  Filled 2021-02-04: qty 100

## 2021-02-04 MED ORDER — CHLORHEXIDINE GLUCONATE 0.12 % MT SOLN: 15.0000 mL | Freq: Once | OROMUCOSAL | Status: AC

## 2021-02-04 MED ORDER — GLYCOPYRROLATE 0.2 MG/ML IJ SOLN
INTRAMUSCULAR | Status: DC | PRN
  Administered 2021-02-04: .2 mg via INTRAVENOUS

## 2021-02-04 MED ORDER — ACETAMINOPHEN 10 MG/ML IV SOLN: 1000.0000 mg | Freq: Once | INTRAVENOUS | Status: DC | PRN

## 2021-02-04 MED ORDER — POLYETHYLENE GLYCOL 3350 17 G PO PACK: 17.0000 g | PACK | Freq: Every day | ORAL | Status: DC | PRN

## 2021-02-04 MED ORDER — LIDOCAINE-EPINEPHRINE 1 %-1:100000 IJ SOLN
INTRAMUSCULAR | Status: DC | PRN
  Administered 2021-02-04: 10 mL

## 2021-02-04 MED ORDER — SUGAMMADEX SODIUM 500 MG/5ML IV SOLN
INTRAVENOUS | Status: DC | PRN
  Administered 2021-02-04: 200 mg via INTRAVENOUS

## 2021-02-04 MED ORDER — GLIPIZIDE ER 10 MG PO TB24
10.0000 mg | ORAL_TABLET | Freq: Every day | ORAL | Status: DC
  Filled 2021-02-04: qty 1

## 2021-02-04 SURGICAL SUPPLY — 70 items
AGENT HMST KT MTR STRL THRMB (HEMOSTASIS) ×1
APL PRP STRL LF ISPRP CHG 10.5 (MISCELLANEOUS) ×1
APPLICATOR CHLORAPREP 10.5 ORG (MISCELLANEOUS) ×2 IMPLANT
BIT DRILL WIRE PASS 1.3MM (BIT) IMPLANT
BLADE CLIPPER SPEC (BLADE) ×2 IMPLANT
BULB RESERV EVAC DRAIN JP 100C (MISCELLANEOUS) ×2 IMPLANT
BUR NEURO DRILL SOFT 3.0X3.8M (BURR) IMPLANT
BUR ROUTER D-58 CRANI (BURR) ×2 IMPLANT
BUR ROUTER D-59 CRANI (BURR) IMPLANT
BUR SPIRAL ROUTER 2.3 (BUR) ×2 IMPLANT
CATH ROBINSON RED A/P 10FR (CATHETERS) IMPLANT
CLIP RANEY DISP (INSTRUMENTS) ×2 IMPLANT
COUNTER NEEDLE 20/40 LG (NEEDLE) ×2 IMPLANT
DRAIN CHANNEL JP 10F RND 20C F (MISCELLANEOUS) ×2 IMPLANT
DRAPE INCISE IOBAN 66X45 STRL (DRAPES) IMPLANT
DRAPE SURG 17X11 SM STRL (DRAPES) ×8 IMPLANT
DRAPE WARM FLUID 44X44 (DRAPES) IMPLANT
DRILL WIRE PASS 1.3MM (BIT)
DRSG TEGADERM 4X10 (GAUZE/BANDAGES/DRESSINGS) IMPLANT
DRSG TELFA 3X8 NADH (GAUZE/BANDAGES/DRESSINGS) ×2 IMPLANT
ELECT CAUTERY BLADE TIP 2.5 (TIP) ×2
ELECT REM PT RETURN 9FT ADLT (ELECTROSURGICAL) ×2
ELECTRODE CAUTERY BLDE TIP 2.5 (TIP) ×1 IMPLANT
ELECTRODE REM PT RTRN 9FT ADLT (ELECTROSURGICAL) ×1 IMPLANT
FORCEPS BIPOLAR SPETZLER 8 1.0 (NEUROSURGERY SUPPLIES) ×2 IMPLANT
GAUZE 4X4 16PLY ~~LOC~~+RFID DBL (SPONGE) ×2 IMPLANT
GAUZE SPONGE 4X4 12PLY STRL (GAUZE/BANDAGES/DRESSINGS) ×8 IMPLANT
GAUZE XEROFORM 5X9 LF (GAUZE/BANDAGES/DRESSINGS) ×2 IMPLANT
GLOVE SRG 8 PF TXTR STRL LF DI (GLOVE) ×1 IMPLANT
GLOVE SURG SYN 6.5 ES PF (GLOVE) ×4 IMPLANT
GLOVE SURG SYN 8.0 (GLOVE) ×4 IMPLANT
GLOVE SURG UNDER POLY LF SZ6.5 (GLOVE) ×4 IMPLANT
GLOVE SURG UNDER POLY LF SZ8 (GLOVE) ×2
GOWN SRG LRG LVL 4 IMPRV REINF (GOWNS) ×2 IMPLANT
GOWN STRL REIN LRG LVL4 (GOWNS) ×4
GOWN STRL REUS W/ TWL XL LVL3 (GOWN DISPOSABLE) ×2 IMPLANT
GOWN STRL REUS W/TWL XL LVL3 (GOWN DISPOSABLE) ×4
GRADUATE 1200CC STRL 31836 (MISCELLANEOUS) ×2 IMPLANT
GRAFT DURAGEN MATRIX 5WX7L (Graft) ×2 IMPLANT
HEMOSTAT SURGICEL 2X14 (HEMOSTASIS) ×2 IMPLANT
KIT TURNOVER KIT A (KITS) ×2 IMPLANT
MANIFOLD NEPTUNE II (INSTRUMENTS) ×2 IMPLANT
MARKER SKIN DUAL TIP RULER LAB (MISCELLANEOUS) ×4 IMPLANT
NS IRRIG 1000ML POUR BTL (IV SOLUTION) IMPLANT
PACK CRANIOTOMY CUSTOM (CUSTOM PROCEDURE TRAY) ×2 IMPLANT
PAD ARMBOARD 7.5X6 YLW CONV (MISCELLANEOUS) ×4 IMPLANT
PERFORATOR LRG  14-11MM (BIT) ×1
PERFORATOR LRG 14-11MM (BIT) ×1 IMPLANT
PIN MAYFIELD SKULL DISP (PIN) IMPLANT
PLATE 1.5  2HOLE LNG NEURO (Plate) ×2 IMPLANT
PLATE 1.5 2HOLE LNG NEURO (Plate) ×2 IMPLANT
PLATE 1.5/0.5 13MM BURR HOLE (Plate) ×2 IMPLANT
SCREW SELF DRILL HT 1.5/4MM (Screw) ×16 IMPLANT
SET CATH VENT DRAIN 3-15 1.9D (DRAIN) ×2 IMPLANT
SHEET NEURO XL SOL CTL (MISCELLANEOUS) ×2 IMPLANT
STAPLER SKIN PROX 35W (STAPLE) ×2 IMPLANT
STRIP CLOSURE SKIN 1/2X4 (GAUZE/BANDAGES/DRESSINGS) ×2 IMPLANT
SURGIFLO W/THROMBIN 8M KIT (HEMOSTASIS) ×2 IMPLANT
SURGILUBE 2OZ TUBE FLIPTOP (MISCELLANEOUS) IMPLANT
SUT NURALON 4 0 TR CR/8 (SUTURE) ×2 IMPLANT
SUT POLYSORB 2-0 5X18 GS-10 (SUTURE) ×4 IMPLANT
SUT SILK 3 0 (SUTURE)
SUT SILK 3-0 18XBRD TIE 12 (SUTURE) IMPLANT
SYSTEM LIMITORR VOL LMT 20ML (CATHETERS) ×2 IMPLANT
TAPE CLOTH 3X10 WHT NS LF (GAUZE/BANDAGES/DRESSINGS) ×2 IMPLANT
TOWEL OR 17X26 4PK STRL BLUE (TOWEL DISPOSABLE) ×8 IMPLANT
TRAY FOLEY SLVR 16FR TEMP STAT (SET/KITS/TRAYS/PACK) IMPLANT
TUBING ART PRESS 48 MALE/FEM (TUBING) IMPLANT
WATER STERILE IRR 1000ML POUR (IV SOLUTION) ×2 IMPLANT
WATER STERILE IRR 500ML POUR (IV SOLUTION) ×2 IMPLANT

## 2021-02-04 NOTE — Transfer of Care (Signed)
Immediate Anesthesia Transfer of Care Note  Patient: Kyle Roberson  Procedure(s) Performed: CRANIOTOMY FOR LEFT SUBDURAL  HEMATOMA EVACUATION (Left)  Patient Location: PACU  Anesthesia Type:General  Level of Consciousness: drowsy and patient cooperative  Airway & Oxygen Therapy: Patient Spontanous Breathing and Patient connected to face mask oxygen  Post-op Assessment: Report given to RN and Post -op Vital signs reviewed and stable  Post vital signs: Reviewed and stable  Last Vitals:  Vitals Value Taken Time  BP 175/69 02/04/21 1824  Temp    Pulse 58 02/04/21 1830  Resp 23 02/04/21 1830  SpO2 100 % 02/04/21 1830  Vitals shown include unvalidated device data.  Last Pain:  Vitals:   02/04/21 1418  TempSrc: Temporal  PainSc: 0-No pain         Complications: No notable events documented.

## 2021-02-04 NOTE — Anesthesia Preprocedure Evaluation (Addendum)
Anesthesia Evaluation  Patient identified by MRN, date of birth, ID band Patient awake  General Assessment Comment:Pt confused as to the exact date and exact reason he is here.   Reviewed: Allergy & Precautions, NPO status , Patient's Chart, lab work & pertinent test results  Airway Mallampati: III  TM Distance: >3 FB Neck ROM: Full    Dental  (+) Poor Dentition, Missing,    Pulmonary neg pulmonary ROS, former smoker,    Pulmonary exam normal        Cardiovascular Exercise Tolerance: Poor hypertension, + Past MI (decades ago), + Cardiac Stents (Remote) and +CHF  DVT: chronic diastolic CHF   + dysrhythmias (Incomplete right bundle branch block) Atrial Fibrillation  Rhythm:Irregular Rate:Normal + Peripheral Edema ECHO 04/2019: 1. Left ventricular ejection fraction, by estimation, is 55 to 60%. The  left ventricle has normal function. The left ventricle has no regional  wall motion abnormalities. There is mild left ventricular hypertrophy.  Left ventricular diastolic parameters  are indeterminate.  2. Right ventricular systolic function is normal. The right ventricular  size is normal. There is moderately elevated pulmonary artery systolic  pressure. The estimated right ventricular systolic pressure is 21.1 mmHg.  3. Left atrial size was moderately dilated.  4. The mitral valve is normal in structure and function. Mild to moderate  mitral valve regurgitation. No evidence of mitral stenosis.  5. The aortic valve is normal in structure and function. Aortic valve  regurgitation is mild. Mild aortic valve sclerosis is present, with no  evidence of aortic valve stenosis.  6. Moderately dilated pulmonary artery.  7. The inferior vena cava is dilated in size with <50% respiratory  variability, suggesting right atrial pressure of 15 mmHg.    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  Presented for AMS and weakness Family reports that the patient developed some difficulty holding a pencil in his right dominant hand last week.  Reproductive/Obstetrics negative OB ROS                          Anesthesia Physical Anesthesia Plan  ASA: 3  Anesthesia Plan: General   Post-op Pain Management:    Induction: Intravenous  PONV Risk Score and Plan: 2  Airway Management Planned: Oral ETT  Additional Equipment:   Intra-op Plan:   Post-operative Plan: Extubation in OR  Informed Consent: I have reviewed the patients History and Physical, chart, labs and discussed the procedure including the risks, benefits and alternatives for the proposed anesthesia with the patient or authorized representative who has indicated his/her understanding and acceptance.     Dental advisory given and Consent reviewed with POA  Plan Discussed with: Anesthesiologist and CRNA  Anesthesia Plan Comments:         Anesthesia Quick Evaluation

## 2021-02-04 NOTE — Progress Notes (Signed)
Patient is doing great in recovery. Patient is responding appropriately and is following commands. Witnessed Dr. Lacinda Axon perform the neuro assessment. 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

## 2021-02-04 NOTE — Interval H&P Note (Signed)
History and Physical Interval Note:  02/04/2021 3:56 PM  Kyle Roberson  has presented today for surgery, with the diagnosis of subdural hematoma S06.5XAA.  The various methods of treatment have been discussed with the patient and family. After consideration of risks, benefits and other options for treatment, the patient has consented to  Procedure(s): CRANIOTOMY FOR LEFT SUBDURAL  HEMATOMA EVACUATION (Left) as a surgical intervention.  The patient's history has been reviewed, patient examined, no change in status, stable for surgery.  I have reviewed the patient's chart and labs.  Questions were answered to the patient's satisfaction.     Deetta Perla

## 2021-02-04 NOTE — Anesthesia Procedure Notes (Signed)
Procedure Name: Intubation Date/Time: 02/04/2021 4:41 PM Performed by: Justin Mend, RN Pre-anesthesia Checklist: Patient identified, Emergency Drugs available, Suction available and Patient being monitored Patient Re-evaluated:Patient Re-evaluated prior to induction Oxygen Delivery Method: Circle system utilized Preoxygenation: Pre-oxygenation with 100% oxygen Induction Type: IV induction Ventilation: Mask ventilation without difficulty Laryngoscope Size: McGraph and 3 Grade View: Grade I Tube type: Oral Tube size: 7.0 mm Number of attempts: 1 Airway Equipment and Method: Stylet and Oral airway Placement Confirmation: ETT inserted through vocal cords under direct vision, positive ETCO2 and breath sounds checked- equal and bilateral Secured at: 22 cm Tube secured with: Tape Dental Injury: Teeth and Oropharynx as per pre-operative assessment

## 2021-02-05 ENCOUNTER — Inpatient Hospital Stay: Payer: Medicare HMO

## 2021-02-05 ENCOUNTER — Encounter: Payer: Self-pay | Admitting: Neurosurgery

## 2021-02-05 LAB — BASIC METABOLIC PANEL
Anion gap: 8 (ref 5–15)
BUN: 36 mg/dL — ABNORMAL HIGH (ref 8–23)
CO2: 25 mmol/L (ref 22–32)
Calcium: 8.4 mg/dL — ABNORMAL LOW (ref 8.9–10.3)
Chloride: 107 mmol/L (ref 98–111)
Creatinine, Ser: 1.9 mg/dL — ABNORMAL HIGH (ref 0.61–1.24)
GFR, Estimated: 35 mL/min — ABNORMAL LOW (ref >60)
Glucose, Bld: 246 mg/dL — ABNORMAL HIGH (ref 70–99)
Potassium: 4 mmol/L (ref 3.5–5.1)
Sodium: 140 mmol/L (ref 135–145)

## 2021-02-05 LAB — CBC
HCT: 38.4 % — ABNORMAL LOW (ref 39.0–52.0)
Hemoglobin: 12.6 g/dL — ABNORMAL LOW (ref 13.0–17.0)
MCH: 30.5 pg (ref 26.0–34.0)
MCHC: 32.8 g/dL (ref 30.0–36.0)
MCV: 93 fL (ref 80.0–100.0)
Platelets: 219 10*3/uL (ref 150–400)
RBC: 4.13 MIL/uL — ABNORMAL LOW (ref 4.22–5.81)
RDW: 13.4 % (ref 11.5–15.5)
WBC: 6.5 10*3/uL (ref 4.0–10.5)
nRBC: 0 % (ref 0.0–0.2)

## 2021-02-05 LAB — HEMOGLOBIN A1C
Hgb A1c MFr Bld: 7.3 % — ABNORMAL HIGH (ref 4.8–5.6)
Mean Plasma Glucose: 162.81 mg/dL

## 2021-02-05 LAB — GLUCOSE, CAPILLARY
Glucose-Capillary: 177 mg/dL — ABNORMAL HIGH (ref 70–99)
Glucose-Capillary: 241 mg/dL — ABNORMAL HIGH (ref 70–99)
Glucose-Capillary: 176 mg/dL — ABNORMAL HIGH (ref 70–99)
Glucose-Capillary: 181 mg/dL — ABNORMAL HIGH (ref 70–99)

## 2021-02-05 LAB — SARS CORONAVIRUS 2 (TAT 6-24 HRS): SARS Coronavirus 2: NEGATIVE

## 2021-02-05 MED ORDER — INSULIN ASPART 100 UNIT/ML IJ SOLN
0.0000 [IU] | Freq: Three times a day (TID) | INTRAMUSCULAR | Status: DC
  Administered 2021-02-05: 2 [IU] via SUBCUTANEOUS
  Administered 2021-02-05: 3 [IU] via SUBCUTANEOUS
  Administered 2021-02-05: 2 [IU] via SUBCUTANEOUS
  Administered 2021-02-06: 13:00:00 1 [IU] via SUBCUTANEOUS
  Administered 2021-02-06: 17:00:00 3 [IU] via SUBCUTANEOUS
  Administered 2021-02-07: 2 [IU] via SUBCUTANEOUS
  Administered 2021-02-07: 1 [IU] via SUBCUTANEOUS
  Administered 2021-02-07: 3 [IU] via SUBCUTANEOUS
  Administered 2021-02-08: 5 [IU] via SUBCUTANEOUS
  Administered 2021-02-08: 1 [IU] via SUBCUTANEOUS
  Administered 2021-02-08: 2 [IU] via SUBCUTANEOUS
  Administered 2021-02-09: 5 [IU] via SUBCUTANEOUS
  Administered 2021-02-09: 1 [IU] via SUBCUTANEOUS
  Administered 2021-02-09 – 2021-02-11 (×5): 2 [IU] via SUBCUTANEOUS
  Administered 2021-02-11: 3 [IU] via SUBCUTANEOUS
  Filled 2021-02-05 (×19): qty 1

## 2021-02-05 NOTE — Progress Notes (Signed)
OT Cancellation Note  Patient Details Name: Kyle Roberson MRN: [id number] DOB: 05-25-1940   Cancelled Treatment:    Reason Eval/Treat Not Completed: Medical issues which prohibited therapy. OT order received and chart reviewed. Pt is s/p left craniotomy for evacuation of subdural hematoma. OT will hold until drain removed and pt is more actively able to engage in therapeutic intervention. OT to re-attempt tomorrow.  Darleen Crocker, MS, OTR/L , CBIS ascom [phone]  02/05/21, 2:35 PM

## 2021-02-05 NOTE — Anesthesia Postprocedure Evaluation (Signed)
Anesthesia Post Note  Patient: Kyle Roberson  Procedure(s) Performed: CRANIOTOMY FOR LEFT SUBDURAL  HEMATOMA EVACUATION (Left)  Patient location during evaluation: PACU Anesthesia Type: General Level of consciousness: awake and alert Pain management: pain level controlled Vital Signs Assessment: post-procedure vital signs reviewed and stable Respiratory status: spontaneous breathing, nonlabored ventilation and respiratory function stable Cardiovascular status: blood pressure returned to baseline and stable Postop Assessment: no apparent nausea or vomiting Anesthetic complications: no   No notable events documented.   Last Vitals:  Vitals:   02/05/21 0000 02/05/21 0100  BP: (!) 166/72 (!) 148/55  Pulse: (!) 58 (!) 46  Resp: (!) 21 20  Temp: 36.4 C   SpO2: 100% 99%    Last Pain:  Vitals:   02/05/21 0000  TempSrc: Oral  PainSc: 0-No pain                 [place] Ouch

## 2021-02-05 NOTE — Progress Notes (Signed)
   Progress Note  History: Kyle Roberson is s/p left craniotomy for evacuation of subdural hematoma  POD#1: NAEO. Pt reports good pain control.   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  Invalid input(s): TBILI   Recent Labs  Lab 02/02/21 2200 02/05/21 0422  WBC 7.3 6.5  HGB 12.8* 12.6*  HCT 39.8 38.4*  PLT 222 219   No results for input(s): APTT, INR in the last 168 hours.       Other tests/results:  CT head 02/05/21 IMPRESSION: 1. Interval left-sided craniotomy with surgical drain in the scalp and underlying reduction in the left cerebral convexity mixed-density subdural hematoma. 2. Scattered air pockets in the collection with decreased left-to-right midline shift now 3 mm. 3. There is decreased mass effect on the left cerebral hemisphere with decreased gyral crowding. There is no downward mass effect. 4. Atrophy and small vessel changes.   Electronically Signed   By: Telford Nab M.D.   On: 02/05/2021 06:03  Assessment/Plan:  Kyle Roberson is a 80 y.o presenting with chronic SDH with midline shift s/p left craniotomy for evacuation.   - mobilize - pain control - DVT prophylaxis; SCDs only at this time - PTOT - like maintain drain for now  Cooper Render  Department of Neurosurgery

## 2021-02-05 NOTE — Op Note (Signed)
Operative Note   SURGERY DATE:  02/04/2021   PRE-OP DIAGNOSIS:  Subdural Hematoma   POST-OP DIAGNOSIS: Post-Op Diagnosis Codes:  Subdural hematoma   Procedure(s) with comments: Left craniotomy, evacuation of subdural hematoma   SURGEON:     * Malen Gauze, MD       Cooper Render, PA Assistant   ANESTHESIA: General    OPERATIVE FINDINGS:    Indication Mr Debono presented to ED on 02/03/2021 with ongoing speech difficulty and right sided weakness. 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 Given the symptoms and to aid in diagnosis, craniotomy for resection of the mass was discussed with and all risks including but not limited to hematoma, infection, weakness, stroke, speech difficulty, CSF leak, death, and seizures were discussed. The patient elected to proceed.   Procedure After obtaining informed consent, the patient was taken to the Operating Room where general anesthesia was induced and the patient intubated. Vascular access was obtained. The patient was positioned supine on a standard OR table with appropriate padding of pressure points.  The left frontal and parietal scalp was prepped and draped in a sterile fashion. A time out was performed. Local anesthesia was instilled along the planned incision sites. Antibiotics and AED were given.   The scalp was opened sharply to the level of the skull and a retractor placed at each incision. A burr hole craniectomy was performed using the perforator drill bit.  Hemostasis was achieved. The dura was coagulated and opened in a cruciate manner. There was an immediate egress of dark fluid consistent with chronic hematoma. Saline was used to irrigate the subdural space removing some fresh blood clots. There appeared to be thick membranes and therefore we converted to a craniotomy. The incisions were connected and then the drill used to perform a craniotomy incorporating the burr holes.   The dura  was then opened and a thick membrane was seen. This was elevated out and removed from the dura. Beneath this, another membrane was seen and then brain. This was followed circumferentially removing clot and membrane. The brain began to rebound at this time and irrigation was used and was seen to flow clearly.   The wound was irrigated with saline.  Duragen was laid over the dura. A burr hole cover was placed with the Lorenz plating system and the bone re-affixed. A drain was passed in the subgaleal space to exit the skin and connected to bulb.  The galea was closed with 2-0 interrupted Vicryl sutures. The skin above was closed with staples. The drain was secured with a 3-0 Nylon and attached to a drainage system. The wound was dressed.   The patient tolerated the procedure well and was seen immediately post-operatively to be following commands with symmetric movement.  The patient was taken to the ICU where recovery contnued. Following the procedure, I spoke with the patient's family about the procedure and answered all questions.    ESTIMATED BLOOD LOSS:   50 cc   SPECIMENS None   IMPLANT GRAFT DURAGEN MATRIX [id number] - [id number]  Inventory Item: GRAFT DURAGEN MATRIX [id number] Serial no.:  Model/Cat no.: [id number]  Implant name: GRAFT DURAGEN MATRIX [id number] - [id number] Laterality: Left Area: Brain  Manufacturer: [employer] Date of Manufacture:    Action: Implanted Number Used: 1   Device Identifier:  Device Identifier Type:     PLATE 1.5  2HOLE LNG NEURO - [id number]  Inventory Item: PLATE 1.5  2HOLE LNG NEURO Serial no.:  Model/Cat no.: [id number]  Implant name: PLATE 1.5  2HOLE LNG NEURO - [id number] Laterality: Left Area: Brain  Manufacturer: [place] [employer] Date of Manufacture:    Action: Implanted Number Used: 2   Device Identifier:  Device Identifier Type:     65mm burr hole  Inventory Item:  Serial no.:  Model/Cat no.: [id number]  Implant name: 70mm burr hole Laterality: Left  Area: Brain  Manufacturer: [employer] Date of Manufacture:    Action: Implanted Number Used: 1   Device Identifier:  Device Identifier Type:     SCREW SELF DRILL HT 1.5/4MM - [id number]  Inventory Item: SCREW SELF DRILL HT 1.5/4MM Serial no.:  Model/Cat no.: [id number]  Implant name: SCREW SELF DRILL HT 1.5/4MM - [id number] Laterality: Left Area: Brain  Manufacturer: [place] [employer] Date of Manufacture:    Action: Implanted Number Used: 8   Device Identifier:  Device Identifier Type:         I performed the case in its entirety with assistance of PA, Ernestene Kiel, [place]

## 2021-02-05 NOTE — Progress Notes (Signed)
PT Cancellation Note  Patient Details Name: Kyle Roberson MRN: [id number] DOB: 30-Nov-1940   Cancelled Treatment:    Reason Eval/Treat Not Completed: Medical issues which prohibited therapy (Consult received and chart reviewed.  Patient noted with intracranial drain still in place post-procedure.  Will hold until drain removed/additional guidance recieved re: activity restrictions.  Will continue to follow and initiate as medically)   Davelle Anselmi H. Owens Shark, PT, DPT, NCS 02/05/21, 9:27 PM [phone]

## 2021-02-06 LAB — GLUCOSE, CAPILLARY
Glucose-Capillary: 92 mg/dL (ref 70–99)
Glucose-Capillary: 147 mg/dL — ABNORMAL HIGH (ref 70–99)
Glucose-Capillary: 220 mg/dL — ABNORMAL HIGH (ref 70–99)
Glucose-Capillary: 211 mg/dL — ABNORMAL HIGH (ref 70–99)

## 2021-02-06 MED ORDER — INSULIN GLARGINE-YFGN 100 UNIT/ML ~~LOC~~ SOLN
10.0000 [IU] | Freq: Every day | SUBCUTANEOUS | Status: DC
  Administered 2021-02-06 – 2021-02-11 (×6): 10 [IU] via SUBCUTANEOUS
  Filled 2021-02-06 (×6): qty 0.1

## 2021-02-06 MED ORDER — INSULIN ASPART 100 UNIT/ML IJ SOLN: 0.0000 [IU] | Freq: Three times a day (TID) | INTRAMUSCULAR | Status: DC

## 2021-02-06 MED ORDER — AMLODIPINE BESYLATE 5 MG PO TABS
5.0000 mg | ORAL_TABLET | Freq: Every day | ORAL | Status: DC
  Administered 2021-02-06: 19:00:00 5 mg via ORAL
  Filled 2021-02-06 (×2): qty 1

## 2021-02-06 MED ORDER — LIDOCAINE HCL (PF) 1 % IJ SOLN: 2.0000 mL | Freq: Once | INTRAMUSCULAR | Status: DC

## 2021-02-06 MED ORDER — HEPARIN SODIUM (PORCINE) 5000 UNIT/ML IJ SOLN
5000.0000 [IU] | Freq: Two times a day (BID) | INTRAMUSCULAR | Status: DC
  Administered 2021-02-06 – 2021-02-10 (×10): 5000 [IU] via SUBCUTANEOUS
  Filled 2021-02-06 (×10): qty 1

## 2021-02-06 MED ORDER — HYDRALAZINE HCL 20 MG/ML IJ SOLN
10.0000 mg | Freq: Three times a day (TID) | INTRAMUSCULAR | Status: DC | PRN
  Administered 2021-02-06 – 2021-02-09 (×3): 10 mg via INTRAVENOUS
  Filled 2021-02-06 (×3): qty 1

## 2021-02-06 MED ORDER — HYDROCORTISONE 1 % EX CREA
TOPICAL_CREAM | Freq: Three times a day (TID) | CUTANEOUS | Status: DC | PRN
  Administered 2021-02-06 – 2021-02-07 (×2): 1 via TOPICAL
  Filled 2021-02-06: qty 28

## 2021-02-06 MED ORDER — DIPHENHYDRAMINE HCL 25 MG PO CAPS
25.0000 mg | ORAL_CAPSULE | Freq: Three times a day (TID) | ORAL | Status: DC | PRN
  Administered 2021-02-06 – 2021-02-07 (×2): 25 mg via ORAL
  Filled 2021-02-06 (×4): qty 1

## 2021-02-06 NOTE — Progress Notes (Signed)
PT Cancellation Note  Patient Details Name: Kyle Roberson MRN: [id number] DOB: [birth year]   Cancelled Treatment:    Reason Eval/Treat Not Completed: Medical issues which prohibited therapy (Evaluation re-attempted.  Patient noted with elevated BP (180s/60s).  RN to administer additional meds for BP control (<160 SBP).  Will re-attempt post-administration.)   Adreanne Yono H. Owens Shark, PT, DPT, NCS 02/06/21, 10:02 AM [phone]

## 2021-02-06 NOTE — Progress Notes (Signed)
   Progress Note  History: Kyle Roberson is s/p left craniotomy for evacuation of subdural hematoma  POD#2: Some incisional pain overnight.  POD#1: NAEO. Pt reports good pain control.   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  Invalid input(s): TBILI   Recent Labs  Lab 02/02/21 2200 02/05/21 0422  WBC 7.3 6.5  HGB 12.8* 12.6*  HCT 39.8 38.4*  PLT 222 219    No results for input(s): APTT, INR in the last 168 hours.       Other tests/results:  CT head 02/05/21 IMPRESSION: 1. Interval left-sided craniotomy with surgical drain in the scalp and underlying reduction in the left cerebral convexity mixed-density subdural hematoma. 2. Scattered air pockets in the collection with decreased left-to-right midline shift now 3 mm. 3. There is decreased mass effect on the left cerebral hemisphere with decreased gyral crowding. There is no downward mass effect. 4. Atrophy and small vessel changes.   Electronically Signed   By: Telford Nab M.D.   On: 02/05/2021 06:03  Assessment/Plan:  Kyle Roberson is a 80 y.o presenting with chronic SDH with midline shift s/p left craniotomy for evacuation.   - mobilize - pain control - started SSI - DVT prophylaxis; started ppx Heparin on 02/06/21 - PTOT; no activity restrictions  - like maintain drain this morning and consider removal this afternoon  Cooper Render  Department of  Neurosurgery

## 2021-02-06 NOTE — Progress Notes (Signed)
Occupational Therapy Evaluation Patient Details Name: Kyle Roberson MRN: [id number] DOB: [birth year] Today's Date: 02/06/2021   History of Present Illness Kyle Roberson is an 80 y.o. male with a PMH significant for CAD, diabetes, hypertension, GERD, hyperlipidemia, chronic A. fib.  CT head shows acute subdural bleeding.  Patient s/p left craniotomy for evacuation of subdural hematoma.   Clinical Impression   Kyle Roberson was seen for OT evaluation this date. Prior to hospital admission, pt was MOD-I using a quad cane for the past month. Pt lives with significant other. Currently pt demonstrates impairments as described below (See OT problem list) which functionally limit his ability to perform ADL/self-care tasks. Pt currently requires MAX A for don/doff socks, able to access top of socks but not remove from foot, pt does not integrate RUE even with VCs. Pt MIN A + RW for standing from chair, requiring RW stabilization + hand-over-hand assist for hand placement. Pt performed self -drinking both sitting and standing with trials for R (MIN A) and L UE. Pt demonstrated overall RUE weakness (see results below). Pt demonstrating coordination deficits including decreased fine motor;decreased gross motor (Needed step by step cuing, unable to perform 5 finger opposition, RAM, finger nose finger). Pt would benefit from skilled OT services to address noted impairments and functional limitations (see below for any additional details) in order to maximize safety and independence while minimizing falls risk and caregiver burden. Upon hospital discharge, recommend CIR HHOT to maximize pt safety and return to functional independence during meaningful occupations of daily life.       Recommendations for follow up therapy are one component of a multi-disciplinary discharge planning process, led by the attending physician.  Recommendations may be updated based on patient status, additional functional criteria and  insurance authorization.   Follow Up Recommendations  Acute inpatient rehab (3hours/day)    Assistance Recommended at Discharge Frequent or constant Supervision/Assistance  Functional Status Assessment  Patient has had a recent decline in their functional status and demonstrates the ability to make significant improvements in function in a reasonable and predictable amount of time.  Equipment Recommendations  Other (comment) (defer to next venue of care)    Recommendations for Other Services       Precautions / Restrictions Precautions Precautions: Fall Restrictions Weight Bearing Restrictions: No      Mobility Bed Mobility               General bed mobility comments: Pt received and left in chair.    Transfers Overall transfer level: Needs assistance Equipment used: Rolling walker (2 wheels) Transfers: Sit to/from Stand Sit to Stand: MIN A - stabilization + hand held assist for hand placement on walker.                   Balance Overall balance assessment: Needs assistance Sitting-balance support: Feet supported;No upper extremity supported Sitting balance-Leahy Scale: Good     Standing balance support: Single extremity supported;During functional activity Standing balance-Leahy Scale: Fair                             ADL either performed or assessed with clinical judgement   ADL Overall ADL's : Needs assistance/impaired                                       General ADL Comments:  MAX A  for don/doff socks, able to access top of socks but not remove from foot, pt does not integrate RUE even with VCs. Pt MIN A + RW for standing from chair, requiring RW stabilization + hand-over-hand assist for hand placement. Pt performed self -drinking both sitting and standing with trials for R (MIN A) and L UE.      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 Continue to assess vision in functional context.        Perception      Praxis      Pertinent Vitals/Pain Pain Assessment: No/denies pain      Hand Dominance Right   Extremity/Trunk Assessment Upper Extremity Assessment Upper Extremity Assessment: RUE deficits/detail RUE Deficits / Details: Grip 4/5, Triceps 4/5, Deltoids 3+/5 RUE Coordination: decreased fine motor;decreased gross motor (Needed step by step cuing, unable to perform 5 finger opposition, RAM, finger nose finger)   Lower Extremity Assessment Lower Extremity Assessment: Defer to PT evaluation       Communication     Cognition Arousal/Alertness: Awake/alert Behavior During Therapy: WFL for tasks assessed/performed Overall Cognitive Status: Within Functional Limits for tasks assessed - increased cueing needed, continue to assess                                       General Comments       Exercises Exercises: Other exercises Other Exercises Other Exercises: Pt educ re: OT role, d/c plans, falls precautions Other Exercises: Stroke screen, pt sit<>stand, functional balance task while standing   Shoulder Instructions      Home Living Family/patient expects to be discharged to:: Private residence Living Arrangements: Spouse/significant other   Type of Home: House Home Access: Stairs to enter     Home Layout: One level               Home Equipment: Cane - quad          Prior Functioning/Environment Prior Level of Function : Independent/Modified Independent             Mobility Comments: quad cane ~ 1 month Mod-I for mobility ADLs Comments: indep ADLs, gardens        OT Problem List: Decreased strength;Decreased range of motion;Impaired balance (sitting and/or standing);Decreased coordination;Decreased safety awareness;Decreased knowledge of use of DME or AE;Decreased knowledge of precautions      OT Treatment/Interventions: Self-care/ADL training;Therapeutic exercise;Neuromuscular education;Energy conservation;DME and/or AE  instruction;Therapeutic activities    OT Goals(Current goals can be found in the care plan section) Acute Rehab OT Goals Patient Stated Goal: to get better OT Goal Formulation: With patient Time For Goal Achievement: 02/20/21 Potential to Achieve Goals: Good ADL Goals Pt Will Perform Grooming: with min assist;standing Pt Will Transfer to Toilet: with modified independence;ambulating;regular height toilet Pt/caregiver will Perform Home Exercise Program: Increased ROM;Increased strength;With theraputty;Both right and left upper extremity;Independently  OT Frequency: Min 5X/week   Barriers to D/C:            Co-evaluation              AM-PAC OT "6 Clicks" Daily Activity     Outcome Measure Help from another person eating meals?: A Little Help from another person taking care of personal grooming?: A Little Help from another person toileting, which includes using toliet, bedpan, or urinal?: A Lot Help from another person bathing (including washing, rinsing, drying)?: A Lot Help from  another person to put on and taking off regular upper body clothing?: A Little Help from another person to put on and taking off regular lower body clothing?: A Lot 6 Click Score: 15   End of Session Equipment Utilized During Treatment: Rolling walker (2 wheels)  Activity Tolerance: Patient tolerated treatment well Patient left: in chair;with call bell/phone within reach  OT Visit Diagnosis: Unsteadiness on feet (R26.81);Other abnormalities of gait and mobility (R26.89);Muscle weakness (generalized) (M62.81)                Time: 3317-4099 OT Time Calculation (min): 25 min Charges:  OT General Charges $OT Visit: 1 Visit OT Evaluation $OT Eval Moderate Complexity: 1 Mod OT Treatments $Self Care/Home Management : 8-22 mins  Nino Glow, Markus Daft 02/06/2021, 2:54 PM

## 2021-02-06 NOTE — Evaluation (Signed)
Physical Therapy Evaluation Patient Details Name: Kyle Roberson MRN: [id number] DOB: 02-08-1941 Today's Date: 02/06/2021  History of Present Illness  admitted for acute hospitalization s/p L craniotomy with evacuation of [facility] (02/05/21).  Clinical Impression  Patient sleeping in bed upon arrival to room; awakens to mod voice and light touch.  Oriented to self, location as hospital (once given choices) and general situation; follows commands, but often requires increased time for processing, task initiation/termination.  Endorses generalized headache, but denies pain elsewhere at this time.  Noted weakness, coordination deficits to R UE > LE, with moderate dysmetria noted during R UE reaching tasks.  Mild R inattention noted, but patient does respond well to and integrates cuing for R visual scanning/awareness.  Currently requiring min/mod assist for bed mobility; min assist +1-2 for sit/stand, standing balance, basic transfers and gait (21') with RW.  Demonstrates inconsistent step height/length and BOS; drifts R with distance, consistent cuing for correction.  Requires visual input and step by step cuing for placement of R UE on RW, mild difficulty maintaining placement/grasp with divided attention Patient with excellent participation during session; family member present and very supportive throughout.  Plans to facilitate discharge home with needed support when medically appropriate post-acute rehab opportunities.   Would benefit from skilled PT to address above deficits and promote optimal return to PLOF.; recommend transition to acute inpatient rehab upon discharge for high-intensity, post-acute rehab services.      Recommendations for follow up therapy are one component of a multi-disciplinary discharge planning process, led by the attending physician.  Recommendations may be updated based on patient status, additional functional criteria and insurance authorization.  Follow Up  Recommendations Acute inpatient rehab (3hours/day)    Assistance Recommended at Discharge Frequent or constant Supervision/Assistance  Functional Status Assessment Patient has had a recent decline in their functional status and demonstrates the ability to make significant improvements in function in a reasonable and predictable amount of time.  Equipment Recommendations  Rolling walker (2 wheels);BSC/3in1    Recommendations for Other Services       Precautions / Restrictions Precautions Precautions: Fall Precaution Comments: Intracranial drain intact (cleared for activity per Dr. Alyssa Grove) Restrictions Weight Bearing Restrictions: No      Mobility  Bed Mobility Overal bed mobility: Needs Assistance Bed Mobility: Supine to Sit     Supine to sit: Mod assist     General bed mobility comments: assist to initiate and sequence movement transition    Transfers Overall transfer level: Needs assistance Equipment used: 2 person hand held assist Transfers: Sit to/from Stand Sit to Stand: Min assist;+2 safety/equipment                Ambulation/Gait Ambulation/Gait assistance: Min assist;+2 safety/equipment Gait Distance (Feet): 50 Feet Assistive device: Rolling walker (2 wheels)         General Gait Details: inconsistent step height/length and BOS; drifts R with distance, consistent cuing for correction.  Requires visual input and step by step cuing for placement of R UE on RW, mild difficulty maintaining placement/grasp with divided attention  Stairs            Wheelchair Mobility    Modified Rankin (Stroke Patients Only)       Balance Overall balance assessment: Needs assistance Sitting-balance support: No upper extremity supported;Feet supported Sitting balance-Leahy Scale: Good     Standing balance support: Bilateral upper extremity supported;No upper extremity supported Standing balance-Leahy Scale: Fair  Pertinent Vitals/Pain Pain Assessment: Faces Faces Pain Scale: Hurts a little bit Facial Expression: Relaxed, neutral Body Movements: Absence of movements Muscle Tension: Relaxed Compliance with ventilator (intubated pts.): N/A Vocalization (extubated pts.): Talking in normal tone or no sound CPOT Total: 0 Pain Descriptors / Indicators: Headache Pain Intervention(s): Limited activity within patient's tolerance;Monitored during session;Repositioned    Home Living Family/patient expects to be discharged to:: Private residence Living Arrangements: Spouse/significant other Available Help at Discharge: Family;Available PRN/intermittently Type of Home: House Home Access: Ramped entrance       Home Layout: One level;Laundry or work area in basement (no essential needs in basement area) Home Equipment: Kasandra Knudsen - quad      Prior Function Prior Level of Function : Independent/Modified Independent;History of Falls (last six months)             Mobility Comments: Indep without assist device at baseline, recent use of quad cane due to worsening balance (since fall).  Does endorse single fall in August, 2022. ADLs Comments: Indep with ADLs, enjoys outdoor work/gardening     Hand Dominance   Dominant Hand: Right    Extremity/Trunk Assessment   Upper Extremity Assessment Upper Extremity Assessment:  (R UE grossly 4-/5 throughout, moderate dysmetria and coordination deficits; mild inattention, positive pronator drift.  L UE grossly WFL) RUE Deficits / Details: Grip 4/5, Triceps 4/5, Deltoids 3+/5 RUE Coordination: decreased fine motor;decreased gross motor (Needed step by step cuing, unable to perform 5 finger opposition, RAM, finger nose finger)    Lower Extremity Assessment Lower Extremity Assessment:  (R LE grossly 4-/5, denies sensory deficit; L LE grossly 4+/5)       Communication   Communication:  (Mild expressive aphasia (often conceals with joking/sarcasm))   Cognition Arousal/Alertness: Awake/alert Behavior During Therapy: WFL for tasks assessed/performed Overall Cognitive Status: Within Functional Limits for tasks assessed                                 General Comments: Oriented to self, location (as hospital when given choices), situation.  Follows commands, increased time for processing, initiation/termination of activity        General Comments      Exercises Other Exercises Other Exercises: Educated in role of PT and progressive mobility, awareness of deficits and overall safety needs; discussed current level of care and rehab potential.  Patient/family member voiced understanding and agreement with all information. Other Exercises: Bed/chair transfer, SPT with bilat HHA, min assist +2 for safety.   Assessment/Plan    PT Assessment Patient needs continued PT services  PT Problem List Decreased strength;Decreased range of motion;Decreased activity tolerance;Decreased balance;Decreased mobility;Decreased coordination;Decreased cognition;Decreased knowledge of use of DME;Decreased safety awareness;Decreased knowledge of precautions;Pain;Decreased skin integrity       PT Treatment Interventions DME instruction;Gait training;Stair training;Functional mobility training;Therapeutic activities;Therapeutic exercise;Balance training;Neuromuscular re-education;Cognitive remediation;Patient/family education    PT Goals (Current goals can be found in the Care Plan section)  Acute Rehab PT Goals Patient Stated Goal: to recover strength and mobility PT Goal Formulation: With patient/family Time For Goal Achievement: 02/20/21 Potential to Achieve Goals: Good    Frequency Min 2X/week   Barriers to discharge        Co-evaluation               AM-PAC PT "6 Clicks" Mobility  Outcome Measure Help needed turning from your back to your side while in a flat bed without using bedrails?: A Little  Help needed moving from  lying on your back to sitting on the side of a flat bed without using bedrails?: A Lot Help needed moving to and from a bed to a chair (including a wheelchair)?: A Little Help needed standing up from a chair using your arms (e.g., wheelchair or bedside chair)?: A Little Help needed to walk in hospital room?: A Lot Help needed climbing 3-5 steps with a railing? : A Lot 6 Click Score: 15    End of Session   Activity Tolerance: Patient tolerated treatment well Patient left: in chair;with call bell/phone within reach;with family/visitor present Nurse Communication: Mobility status PT Visit Diagnosis: Muscle weakness (generalized) (M62.81);Difficulty in walking, not elsewhere classified (R26.2);Hemiplegia and hemiparesis Hemiplegia - Right/Left: Right Hemiplegia - dominant/non-dominant: Dominant Hemiplegia - caused by: Other cerebrovascular disease    Time: 7322-5672 PT Time Calculation (min) (ACUTE ONLY): 48 min   Charges:   PT Evaluation $PT Eval Moderate Complexity: 1 Mod PT Treatments $Therapeutic Activity: 8-22 mins $Neuromuscular Re-education: 8-22 mins        Johathon Overturf H. Owens Shark, PT, DPT, NCS 02/06/21, 3:28 PM [phone]

## 2021-02-06 NOTE — TOC Initial Note (Signed)
Transition of Care [facility]) - Initial/Assessment Note    Patient Details  Name: Kyle Roberson MRN: [id number] Date of Birth: 08-19-1940  [facility]) CM/SW Contact:    Kyle Salen, RN Phone Number: 02/06/2021, 3:49 PM  Clinical Narrative: TOCRN spoke with patient who is alert and oriented x3 sitting in recliner. Says he lives with significant other Kyle Roberson in [place] receives primary care at the [facility] clinic. Able to cook and shop however Kyle Roberson drives. Did not use assisted device for ambulation, no HH services and do not want to use them. Use [facility] pharmacy in [place]. Patient being evaluated by [facility] at this time. TOC to continue to track.                        Patient Goals and CMS Choice        Expected Discharge Plan and Services                                                Prior Living Arrangements/Services                       Activities of Daily Living   ADL Screening (condition at time of admission) Patient's cognitive ability adequate to safely complete daily activities?: Yes Is the patient deaf or have difficulty hearing?: Yes Does the patient have difficulty seeing, even when wearing glasses/contacts?: Yes  Permission Sought/Granted                  Emotional Assessment              Admission diagnosis:  Subdural hematoma [S06.5XAA] Patient Active Problem List   Diagnosis Date Noted   Subdural hematoma 02/04/2021   Anasarca    Chronic a-fib ([place])    Hyperlipidemia associated with type 2 diabetes mellitus ([place])    Acute on chronic diastolic CHF (congestive heart failure) ([place]) 07/19/2019   Abdominal swelling    SOB (shortness of breath) 05/07/2019   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 04/28/2019   CKD (chronic kidney disease) stage 4, GFR 15-29 ml/min (HCC) 04/27/2019   Chronic heart failure with preserved ejection fraction (HFpEF) ([place]) 04/27/2019   Morbid obesity ([place])  04/27/2019   Lymphedema 03/23/2019   Erectile dysfunction 08/01/2018   History of colonic diverticulitis 08/01/2018   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   Obesity (BMI 30-39.9) 08/01/2018   Venous insufficiency 08/01/2018   Persistent atrial fibrillation ([place]) 08/01/2018   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   Essential hypertension 07/06/2018   Type II diabetes mellitus with complication ([place]) 83/66/2947   Atherosclerotic heart disease of native coronary 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 ([place]) 06/05/2018   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[place]) 01/30/2017   Vitamin D deficiency 01/30/2017   Benign non-nodular prostatic hyperplasia with lower urinary tract symptoms 09/29/2014   History of MI (myocardial infarction) 06/27/2014   Nephrolithiasis 01/04/2012   PCP:  Kyle Hess, MD Pharmacy:   [facility] [place], [place] [address] [place] [address] Phone: [phone] Fax: [phone]     Social Determinants of Health (SDOH) Interventions    Readmission Risk Interventions Readmission Risk Prevention Plan 07/09/2018  Transportation Screening Complete  PCP or Specialist  Appt within 5-7 Days Complete  Home Care Screening Complete  Medication Review (RN CM) Complete

## 2021-02-06 NOTE — Progress Notes (Signed)
Inpatient Diabetes Program Recommendations  AACE/ADA: New Consensus Statement on Inpatient Glycemic Control (2015)  Target Ranges:  Prepandial:   less than 140 mg/dL      Peak postprandial:   less than 180 mg/dL (1-2 hours)      Critically ill patients:  140 - 180 mg/dL    Latest Reference Range & Units 02/05/21 09:13 02/05/21 11:29 02/05/21 15:40 02/05/21 21:20  Glucose-Capillary 70 - 99 mg/dL 177 (H) 241 (H) 176 (H) 181 (H)  (H): Data is abnormally high  Latest Reference Range & Units 02/06/21 07:19 02/06/21 11:25  Glucose-Capillary 70 - 99 mg/dL 92 147 (H)  (H): Data is abnormally high  Latest Reference Range & Units 02/05/21 04:22  Hemoglobin A1C 4.8 - 5.6 % 7.3 (H)  (H): Data is abnormally high    Admit with: Subdural Hematoma  History: DM  Home DM Meds: Glipizide 10 mg BID       NPH Insulin 10 units CBG 200-250 and 15 units CBG >250  Current Orders: Semglee 10 units Daily     Novolog Sensitive Correction Scale/ SSI (0-9 units) TID AC      CBGs well controlled on current Insulin regimen  Current A1c of 7.3% shows decent CBG control at home  Will follow and assist    --Will follow patient during hospitalization--  Wyn Quaker RN, MSN, CDE Diabetes Coordinator Inpatient Glycemic Control Team Team Pager: [phone] (8a-5p)

## 2021-02-06 NOTE — Progress Notes (Signed)
Pt Alert and oriented X4.Weakness to R upper and lower extremities, Per wife [name] this is getting better since surgery. Expressive aphagia at times which has also improved since surgery per wife. No changes in neuro status this shift. JP removed by neuro team. PRN oxycodone for pain. Surgical site intact.

## 2021-02-06 NOTE — Progress Notes (Signed)
Inpatient Rehab Admissions Coordinator:   Consult received and chart reviewed.  Therapy evaluations pending.  Will follow.  Shann Medal, PT, DPT Admissions Coordinator [phone] 02/06/21  1:41 PM

## 2021-02-06 NOTE — Consult Note (Addendum)
Medical Consultation   Kyle Roberson  ATF:[id number]  DOB: January 01, 1941  DOA: 02/04/2021  PCP: Glean Hess, MD   Outpatient Specialists:    Requesting physician: Deetta Perla  Reason for consultation: Medical management.  History of Present Illness: Kyle Roberson is an 80 y.o. male with a PMH significant for CAD, diabetes, hypertension, GERD, hyperlipidemia, chronic A. 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  Patient has developed difficulty holding a pencil in his right dominant hand for 1 week.  CT head shows acute subdural bleeding.  Neurosurgery was consulted.  Patient underwent left craniotomy for evacuation of subdural hematoma.  Postoperatively patient has been doing better.  Medical consultation is requested for management for hypertension and diabetes. Patient reports having hypertension and diabetes for long time and remains under good control at home.  Review of Systems:  Review of Systems  Constitutional: Negative.   HENT: Negative.    Eyes: Negative.   Respiratory: Negative.    Cardiovascular: Negative.   Gastrointestinal: Negative.   Genitourinary: Negative.   Musculoskeletal: Negative.   Skin: Negative.   Neurological:  Positive for sensory change and weakness.       S/p left craniotomy.  Endo/Heme/Allergies: Negative.     Past Medical History: Past Medical History:  Diagnosis Date   AKI (acute kidney injury) ([place]) 07/06/2018   CAD (coronary artery disease)    Cellulitis of lower extremity 05/31/2019   Diabetes (HCC)    GERD (gastroesophageal reflux disease)    HLD (hyperlipidemia)    HTN (hypertension)     Past Surgical History: Past Surgical History:  Procedure Laterality Date   BLEPHAROPLASTY     CARDIAC CATHETERIZATION     CORONARY STENT INTERVENTION     CRANIOTOMY Left 02/04/2021   Procedure: CRANIOTOMY FOR LEFT SUBDURAL   HEMATOMA EVACUATION;  Surgeon: Deetta Perla, MD;  Location: [facility] ORS;  Service: Neurosurgery;  Laterality: Left;   CYSTOSCOPY     HERNIA REPAIR     Allergies:   Allergies  Allergen Reactions   Atorvastatin Hives and Itching   Simvastatin Hives and Itching     Social History:  reports that he has quit smoking. He has never used smokeless tobacco. He reports that he does not currently use alcohol after a past usage of about 2.0 standard drinks per week. He reports that he does not currently use drugs.   Family History: Family History  Problem Relation Age of Onset   Pancreatic cancer Mother    CAD Father    Diabetes Brother     Family history reviewed and not pertinent    Physical Exam: Vitals:   02/06/21 1000 02/06/21 1100 02/06/21 1200 02/06/21 1300  BP: (!) 157/66 (!) 187/99 (!) 149/64 (!) 160/64  Pulse: 66 66 (!) 58 68  Resp: (!) 25 (!) 22 (!) 24 (!) 22  Temp:    97.8 F (36.6 C)  TempSrc:    Oral  SpO2: 98% 98% 92% 94%  Weight:      Height:        Constitutional: Alert and awake, oriented x 3, not in any acute distress.  Left craniotomy scar noted, drain with pinkish fluid noted Eyes: PERLA, EOMI, irises appear normal, anicteric sclera,  ENMT: external ears and nose appear normal, normal hearing  Lips appears normal, oropharynx mucosa, tongue, posterior pharynx appear normal  Neck: neck appears normal, no masses, normal ROM, no thyromegaly, no JVD  CVS: S1-S2 heard, regular rate and rhythm, no murmur Respiratory: Clear to auscultation bilaterally, no wheezing, no crackles.  No accessory muscle use. Abdomen: Abdomen is soft, nontender, nondistended, BS +. musculoskeletal: 2+ pitting edema, no cyanosis, no clubbing                      Neuro: Cranial nerves II-XII intact, strength, sensation, reflexes normal. Psych: judgement and insight appear normal, stable mood and affect, mental status Skin: no rashes or lesions or ulcers, no induration or nodules      Data reviewed:  I have personally reviewed following labs and imaging studies Labs:  CBC: Recent Labs  Lab 02/02/21 2200 02/05/21 0422  WBC 7.3 6.5  HGB 12.8* 12.6*  HCT 39.8 38.4*  MCV 93.9 93.0  PLT 222 716    Basic Metabolic Panel: Recent Labs  Lab 02/02/21 2200 02/05/21 0422  NA 138 140  K 3.5 4.0  CL 104 107  CO2 26 25  GLUCOSE 261* 246*  BUN 39* 36*  CREATININE 2.04* 1.90*  CALCIUM 8.8* 8.4*   GFR Estimated Creatinine Clearance: 35.1 mL/min (A) (by C-G formula based on SCr of 1.9 mg/dL (H)). Liver Function Tests: No results for input(s): AST, ALT, ALKPHOS, BILITOT, PROT, ALBUMIN in the last 168 hours. No results for input(s): LIPASE, AMYLASE in the last 168 hours. No results for input(s): AMMONIA in the last 168 hours. Coagulation profile No results for input(s): INR, PROTIME in the last 168 hours.  Cardiac Enzymes: No results for input(s): CKTOTAL, CKMB, CKMBINDEX, TROPONINI in the last 168 hours. BNP: Invalid input(s): POCBNP CBG: Recent Labs  Lab 02/05/21 1129 02/05/21 1540 02/05/21 2120 02/06/21 0719 02/06/21 1125  GLUCAP 241* 176* 181* 92 147*   D-Dimer No results for input(s): DDIMER in the last 72 hours. Hgb A1c Recent Labs    02/05/21 0422  HGBA1C 7.3*   Lipid Profile No results for input(s): CHOL, HDL, LDLCALC, TRIG, CHOLHDL, LDLDIRECT in the last 72 hours. Thyroid function studies No results for input(s): TSH, T4TOTAL, T3FREE, THYROIDAB in the last 72 hours.  Invalid input(s): FREET3 Anemia work up No results for input(s): VITAMINB12, FOLATE, FERRITIN, TIBC, IRON, RETICCTPCT in the last 72 hours. Urinalysis    Component Value Date/Time   COLORURINE YELLOW (A) 02/04/2021 1358   APPEARANCEUR CLEAR (A) 02/04/2021 1358   LABSPEC 1.014 02/04/2021 1358   PHURINE 5.0 02/04/2021 1358   GLUCOSEU NEGATIVE 02/04/2021 1358   HGBUR NEGATIVE 02/04/2021 [place] 02/04/2021 1358   BILIRUBINUR neg 01/10/2019 1628    KETONESUR NEGATIVE 02/04/2021 1358   PROTEINUR 100 (A) 02/04/2021 1358   UROBILINOGEN 0.2 01/10/2019 1628   NITRITE NEGATIVE 02/04/2021 [place] 02/04/2021 1358     Microbiology Recent Results (from the past 240 hour(s))  SARS CORONAVIRUS 2 (TAT 6-24 HRS) Nasopharyngeal Urine, Clean Catch     Status: None   Collection Time: 02/04/21  1:58 PM   Specimen: Urine, Clean Catch; Nasopharyngeal  Result Value Ref Range Status   SARS Coronavirus 2 NEGATIVE NEGATIVE Final    Comment: (NOTE) SARS-CoV-2 target nucleic acids are NOT DETECTED.  The SARS-CoV-2 RNA is generally detectable in upper and lower respiratory specimens during the acute phase of infection. Negative results do not preclude SARS-CoV-2 infection, do not rule out co-infections with other pathogens, and should  not be used as the sole basis for treatment or other patient management decisions. Negative results must be combined with clinical observations, patient history, and epidemiological information. The expected result is Negative.  Fact Sheet for Patients: SugarRoll.be  Fact Sheet for Healthcare Providers: https://www.woods-mathews.com/  This test is not yet approved or cleared by the [place] FDA and  has been authorized for detection and/or diagnosis of SARS-CoV-2 by FDA under an Emergency Use Authorization (EUA). This EUA will remain  in effect (meaning this test can be used) for the duration of the COVID-19 declaration under Se ction 564(b)(1) of the Act, 21 U.S.C. section 360bbb-3(b)(1), unless the authorization is terminated or revoked sooner.  Performed at [facility], [place] [address], [place], [place] [address]   Surgical pcr screen     Status: None   Collection Time: 02/04/21  1:58 PM  Result Value Ref Range Status   MRSA, PCR NEGATIVE NEGATIVE Final   Staphylococcus aureus NEGATIVE NEGATIVE Final    Comment: (NOTE) The Xpert  SA Assay (FDA approved for NASAL specimens in patients 82 years of age and older), is one component of a comprehensive surveillance program. It is not intended to diagnose infection nor to guide or monitor treatment. Performed at [facility], [address] [place], [place] [address]   MRSA Next Gen by PCR, Nasal     Status: None   Collection Time: 02/04/21  7:22 PM   Specimen: Nasal Mucosa; Nasal Swab  Result Value Ref Range Status   MRSA by PCR Next Gen NOT DETECTED NOT DETECTED Final    Comment: (NOTE) The GeneXpert MRSA Assay (FDA approved for NASAL specimens only), is one component of a comprehensive MRSA colonization surveillance program. It is not intended to diagnose MRSA infection nor to guide or monitor treatment for MRSA infections. Test performance is not FDA approved in patients less than 23 years old. Performed at [facility], [address] [place], [place] [address]        Inpatient Medications:   Scheduled Meds:  Chlorhexidine Gluconate Cloth  6 each Topical Daily   dorzolamide-timolol  1 drop Left Eye BID   heparin injection (subcutaneous)  5,000 Units Subcutaneous Q12H   hydrALAZINE  100 mg Oral TID   insulin aspart  0-9 Units Subcutaneous TID WC   insulin glargine-yfgn  10 Units Subcutaneous Daily   levETIRAcetam  500 mg Oral BID   senna  1 tablet Oral BID   torsemide  60 mg Oral Daily   Continuous Infusions:   Radiological Exams on Admission: CT HEAD WO CONTRAST  Result Date: 02/05/2021 CLINICAL DATA:  Follow-up left subdural hematoma. EXAM: CT HEAD WITHOUT CONTRAST TECHNIQUE: Contiguous axial images were obtained from the base of the skull through the vertex without intravenous contrast. COMPARISON:  MRI brain 02/03/2021, head CT 02/02/2021 FINDINGS: Brain: Again noted is a mixed density left cerebral convexity subdural hematoma, decreased in size with a maximum thickness of 1.4 cm, previously 1.8 cm, with less underlying  crowding of the left frontoparietal cerebral gyri. There are small scattered air pockets in the collection. No new hemorrhage is seen. Midline shift is improved was previously 5 mm now 3 mm, left to right at about the roof of the third ventricle. There is no appreciable new or acute bleed. There is moderately advanced cerebral atrophy and small vessel disease. Small chronic perivascular space posterior left basal ganglia. There is partial cerebellar atrophy. No acute infarct is seen. Vascular: The carotid  siphons are heavily calcified. There are no hyperdense central vessels. Skull: There is interval new demonstration of a lateral left parietal high convexity craniotomy with overlying skin staples. There is an underlying surgical drain in the scalp but the drain does not enter the subdural space. Otherwise intact calvarium. Sinuses/Orbits: No acute finding. Other: None. IMPRESSION: 1. Interval left-sided craniotomy with surgical drain in the scalp and underlying reduction in the left cerebral convexity mixed-density subdural hematoma. 2. Scattered air pockets in the collection with decreased left-to-right midline shift now 3 mm. 3. There is decreased mass effect on the left cerebral hemisphere with decreased gyral crowding. There is no downward mass effect. 4. Atrophy and small vessel changes. Electronically Signed   By: Telford Nab M.D.   On: 02/05/2021 06:03    Impression/Recommendations Active Problems:   Subdural hematoma  Subdural hematoma: Patient presented with difficulty holding pencil in his right dominant head. CT head shows acute subdural hematoma.  Neurosurgery consulted. Patient underwent left craniotomy with evacuation of the hematoma. Postoperatively patient has been doing much better.  PT and OT evaluation has completed. PT has recommended acute inpatient rehab.  Essential hypertension: Blood pressure is under reasonable control. Continue hydralazine 100 mg 3 times daily. Continue  torsemide 60 mg daily. Start hydralazine 10 mg IV every 8 hours as needed for SBP above 170 Continue labetalol as needed. Consider Amlodipine 5mg  if BP remains elevated.  Diabetes mellitus type 2: Continue regular insulin sliding scale. Start Semglee10 units at bedtime. Monitor fingersticks 3 times daily and nightly -Hemoglobin A1c Diabetic coordinator consult.  Chronic diastolic CHF: Appears euvolemic, pedal edema+ At home patient was on Zaroxolyn and torsemide. Continue torsemide 60 mg daily. Last LVEF 55 to 60% Obtain 2D echocardiogram  CKD stage IIIb: Serum creatinine baseline.  Baseline creatinine remains between 1.9-2.4 Avoid nephrotoxic medications, continue to monitor serum creatinine  Chronic atrial fibrillation: Patient was on Eliquis before admission. Eliquis is discontinued due to subdural hematoma. Heart rate remains controlled.  Thank you for this consultation.  Our [facility] hospitalist team will follow the patient with you.   Time Spent: 35 mins  Shawna Clamp M.D. [facility] 02/06/2021, 2:13 PM

## 2021-02-07 ENCOUNTER — Inpatient Hospital Stay
Admission: RE | Admit: 2021-02-07 | Discharge: 2021-02-07 | Disposition: A | Discharge status: Home / Self Care | Payer: Medicare HMO | Source: Home / Self Care | Attending: Family Medicine | Admitting: Family Medicine

## 2021-02-07 LAB — GLUCOSE, CAPILLARY
Glucose-Capillary: 121 mg/dL — ABNORMAL HIGH (ref 70–99)
Glucose-Capillary: 169 mg/dL — ABNORMAL HIGH (ref 70–99)
Glucose-Capillary: 223 mg/dL — ABNORMAL HIGH (ref 70–99)
Glucose-Capillary: 197 mg/dL — ABNORMAL HIGH (ref 70–99)

## 2021-02-07 MED ORDER — AMLODIPINE BESYLATE 5 MG PO TABS
5.0000 mg | ORAL_TABLET | Freq: Two times a day (BID) | ORAL | Status: DC
  Administered 2021-02-07: 5 mg via ORAL

## 2021-02-07 MED ORDER — AMLODIPINE BESYLATE 5 MG PO TABS: 5.0000 mg | ORAL_TABLET | Freq: Every day | ORAL | Status: DC

## 2021-02-07 MED ORDER — AMLODIPINE BESYLATE 5 MG PO TABS
5.0000 mg | ORAL_TABLET | Freq: Two times a day (BID) | ORAL | Status: DC
  Administered 2021-02-07 – 2021-02-08 (×2): 5 mg via ORAL
  Filled 2021-02-07 (×3): qty 1

## 2021-02-07 NOTE — Progress Notes (Signed)
Pt is the same a yesterday neurologically but is a little spaced out, fatigued this shift. He is taking prn benadryl for continued back itch. But claims he's not tired. Neuro made aware.

## 2021-02-07 NOTE — Progress Notes (Addendum)
Inpatient Rehab Admissions Coordinator:   Talked to patient over the phone to discuss CIR recommendations and goals/expectations of CIR stay.  I let him know that CIR would be 3 hr/day of therapy with physician follow up and estimated length of stay 2 weeks.  Pt states he lives at home with his significant other, [name], and she works during they day so may not be able to provide 24/7 supervision.  I will need insurance authorization, so I will start that request today and will f/u with [name] to confirm support. I would not be able to admit this patient over the weekend without prior auth and no beds available so I will plan to follow up on Monday.  Of note, when I mentioned that I would f/u with him on Monday he asked if he would still be in the hospital on Monday.  I advised him that if he still needed rehab, it was likely, but that if he and [name] felt that he had progressed to a level they could manage at home, I could not force him to come to rehab.  I will call [name] to discuss so they can make a decision over the weekend.   1524: Spoke to pt's significant other, [name], on the phone.  She is on board for CIR and will discuss with patient as well.  I will start insurance authorization request today and will f/u on Monday.    Shann Medal, PT, DPT Admissions Coordinator [phone] 02/07/21  10:55 AM

## 2021-02-07 NOTE — Assessment & Plan Note (Addendum)
Blood pressure is improving on current regimen. Continue hydralazine, torsemide, Norvasc, as needed hydralazine and labetalol. Adjust as need

## 2021-02-07 NOTE — Assessment & Plan Note (Addendum)
SP left craniotomy for evacuation of subdural hematoma.  Drain removed.  Management per neurosurgery. Waiting for bed on MedSurg.

## 2021-02-07 NOTE — Progress Notes (Signed)
[facility] Consultation Progress Note  Patient: Kyle Roberson WUG:[id number]   PCP: Glean Hess, MD DOB: 12-20-1940   DOA: 02/04/2021   DOS: 02/07/2021   Date of Service: the patient was seen and examined on 02/07/2021 Primary service: Deetta Perla, MD    Brief hospital course: 11/15 presented to the hospital.  Craniotomy performed. 11/17 hospitalist consult.   Assessment and Plan: * Subdural hematoma SP left craniotomy for evacuation of subdural hematoma.  Drain removed.  Management per neurosurgery.  Scheduled to go to [place].  Chronic heart failure with preserved ejection fraction (HFpEF) (HCC) Volume stable.  Trace pitting edema present.  Patient was on Zaroxolyn and torsemide. Continue torsemide 60 mg daily.  Last echocardiogram shows 55 to 60% EF. Do not think the patient requires another echocardiogram  Essential hypertension Blood pressure is improving on current regimen. Continue hydralazine, torsemide, Norvasc, as needed hydralazine and labetalol.  Type 2 diabetes mellitus with stage 4 chronic kidney disease (HCC) uncontrolled with hyperglycemia, on long-term insulin  Currently on sliding-scale insulin.  We will continue.  Continue Lantus 10 units daily. Home DM Meds:  Glipizide 10 mg BID NPH Insulin 10 units CBG 200-250 and 15 units CBG >250  Chronic a-fib (HCC) Remains in A. fib but rate controlled. Actually has bradycardia. Not on any medication that blocks AV node. Not a candidate for anticoagulation due to SDH. Was on Eliquis before admission.  CKD (chronic kidney disease) stage 4, GFR 15-29 ml/min (HCC) Renal function stable.  Monitor.  Avoid nephrotoxic medication.  Obesity (BMI 30-39.9) Placing the patient has a poor outcome.  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  Monitor.  Benign non-nodular prostatic hyperplasia with lower urinary tract symptoms Monitor for retention    We will continue to follow the  patient.    Recommendation on discharge: Continue current management.  Subjective: Denies any acute complaint.  No nausea or vomiting.  No headache.  No focal deficit.  Objective: General: Appear in mild distress, no Rash; Oral Mucosa Clear, moist. no Abnormal Neck Mass Or lumps, Conjunctiva normal, left eye vision loss chronic. Cardiovascular: S1 and S2 Present, no Murmur, Respiratory: good respiratory effort, Bilateral Air entry present and CTA, no Crackles, no wheezes Abdomen: Bowel Sound present, Soft and no tenderness Extremities: no Pedal edema Neurology: alert and oriented to time, place, and person affect appropriate. no new focal deficit Gait not checked due to patient safety concerns    Family Communication: no family was present at bedside, at the time of interview.   Data Reviewed: My review of labs, imaging, notes and other tests shows no new significant findings.   Time spent: 35 minutes  Author: Berle Mull, MD 02/07/2021 6:24 PM  To reach On-call, see care teams to locate the attending and reach out to them via [email or web address]. If 7PM-7AM, please contact night-coverage If you still have difficulty reaching the attending provider, please page the [facility] (Director on Call) for [facility] on amion for assistance.

## 2021-02-07 NOTE — Assessment & Plan Note (Addendum)
Currently on sliding-scale insulin.  We will continue.  Continue Semglee 10 units daily. Home DM Meds:  Glipizide 10 mg BID NPH Insulin 10 units CBG 200-250 and 15 units CBG >250

## 2021-02-07 NOTE — Assessment & Plan Note (Addendum)
Renal function stable.  Monitor.  Avoid nephrotoxic medication

## 2021-02-07 NOTE — Assessment & Plan Note (Addendum)
Monitor for retention.

## 2021-02-07 NOTE — Hospital Course (Addendum)
11/15 presented to the hospital.  Craniotomy performed. 11/17 hospitalist consult. 11/19: Waiting for floor bed here and CIR placement for disposition 11/20: Blood sugar trending minimally up.  Added NovoLog 3 units 3 times daily with meals.  Heart rate running slow Norvasc stopped.   11/22 Started on apixban 2.5 mg.

## 2021-02-07 NOTE — Assessment & Plan Note (Addendum)
Volume stable.  Trace pitting edema present.  Patient was on Zaroxolyn and torsemide. Continue torsemide 60 mg daily.  Last echocardiogram shows 55 to 60% EF.

## 2021-02-07 NOTE — Progress Notes (Addendum)
   Progress Note  History: Kyle Roberson is s/p left craniotomy for evacuation of subdural hematoma  POD#3: drain removed yesterday. NAEO  POD#2: Some incisional pain overnight.  POD#1: NAEO. Pt reports good pain control.   Physical Exam: Vitals:   02/07/21 0500 02/07/21 0600  BP: (!) 148/65 (!) 160/69  Pulse: (!) 58 62  Resp: (!) 25 (!) 26  Temp:    SpO2: 98% 98%    AA Ox3; mild dysarthria  CNI except left eye blindness (baseline) Strength:5/5 throughout  No pronator drift Incision c/d/I with staples in place   Data:  Recent Labs  Lab 02/02/21 2200 02/05/21 0422  NA 138 140  K 3.5 4.0  CL 104 107  CO2 26 25  BUN 39* 36*  CREATININE 2.04* 1.90*  GLUCOSE 261* 246*  CALCIUM 8.8* 8.4*    No results for input(s): AST, ALT, ALKPHOS in the last 168 hours.  Invalid input(s): TBILI   Recent Labs  Lab 02/02/21 2200 02/05/21 0422  WBC 7.3 6.5  HGB 12.8* 12.6*  HCT 39.8 38.4*  PLT 222 219    No results for input(s): APTT, INR in the last 168 hours.       Other tests/results:  CT head 02/05/21 IMPRESSION: 1. Interval left-sided craniotomy with surgical drain in the scalp and underlying reduction in the left cerebral convexity mixed-density subdural hematoma. 2. Scattered air pockets in the collection with decreased left-to-right midline shift now 3 mm. 3. There is decreased mass effect on the left cerebral hemisphere with decreased gyral crowding. There is no downward mass effect. 4. Atrophy and small vessel changes.   Electronically Signed   By: Telford Nab M.D.   On: 02/05/2021 06:03  Assessment/Plan:  Kyle Roberson is a 80 y.o presenting with chronic SDH with midline shift s/p left craniotomy for evacuation.   - mobilize - pain control - started SSI - DVT prophylaxis; started ppx Heparin on 02/06/21 - PTOT; no activity restrictions  - drain removed 02/06/21 - hospitalist consulted for medical management - will transfer out of  ICU today - dispo planning underway  Cooper Render  Department of Neurosurgery

## 2021-02-07 NOTE — Assessment & Plan Note (Addendum)
Counseled for weight loss

## 2021-02-07 NOTE — Assessment & Plan Note (Addendum)
Remains in A. fib but rate controlled. Actually has bradycardia. Not on any medication that blocks AV node. Not a candidate for anticoagulation due to SDH. Was on Eliquis before admission which is stopped.

## 2021-02-07 NOTE — Progress Notes (Signed)
Pt HR dips down to 40-50's this afternoon when asleep. Asymptomatic. MD's made aware, will continue to monitor.

## 2021-02-07 NOTE — Progress Notes (Signed)
Met wife of patient on elevator. She shared what brought her husband here and how tough it has been the past days providing care for him and working with her daily life. She knows it is a long road ahead for his recovery. I encouraged her to try to do self care as well. She was appreciative of the support.

## 2021-02-07 NOTE — TOC Progression Note (Signed)
Transition of Care [facility]) - Progression Note    Patient Details  Name: Kyle Roberson MRN: [id number] Date of Birth: 1940/12/14  Transition of Care [facility] CM/SW [place], [place] Phone Number: [phone] 02/07/2021, 12:01 PM  Clinical Narrative:     Patient received acute inpatient rehab recommendation from PT/OT.  Caitlin PT from CIR has spoken with patient and will accept patient to CIR once the insurance authorization has been confirmed and there is bed availability.  Plan is for CIR discharge next week.        Expected Discharge Plan and Services                                                 Social Determinants of Health (SDOH) Interventions    Readmission Risk Interventions Readmission Risk Prevention Plan 07/09/2018  Transportation Screening Complete  PCP or Specialist Appt within 5-7 Days Complete  Home Care Screening Complete  Medication Review (RN CM) Complete

## 2021-02-07 NOTE — Progress Notes (Signed)
Occupational Therapy Treatment Patient Details Name: Kyle Roberson MRN: [id number] DOB: 24-Feb-1941 Today's Date: 02/07/2021   History of present illness admitted for acute hospitalization s/p L craniotomy with evacuation of SDH (02/05/21).   OT comments  Kyle Roberson was seen for OT treatment on this date. Upon arrival to room pt seated in chair, agreeable to tx. Pt requires MIN A + RW sit<>stand - assist for eccentric control, RW mgmt, and cues to place RUE on walker. MIN A + RW for functional reaching task in standing - pt reaching at shoulder height to remove kleenex tissue from box. Pt utilizing lateral pinch grip only, unable to utilize tripod or tip to tip pinch. MOD A hand over hand to ball up tissue in R hand, pt repeatedly dropping tissue and requires cues to recognize. MOD A opening lotion bottle using RUE. MOD cues to place lotion on OTs finger - pt demos difficulty locating finger and recognizing completion of task. MOD cues to remove/replace lid of cup - pt unable to recognize R thumb in way of lid without cuing.   Pt provided built up foam handle for self-feeding, instructed on use. Instructed on HEP for RUE, plan for theraputty next session. Pt making good progress toward goals. Pt continues to benefit from skilled OT services to maximize return to PLOF and minimize risk of future falls, injury, caregiver burden, and readmission. Will continue to follow POC. Discharge recommendation remains appropriate.     Recommendations for follow up therapy are one component of a multi-disciplinary discharge planning process, led by the attending physician.  Recommendations may be updated based on patient status, additional functional criteria and insurance authorization.    Follow Up Recommendations  Acute inpatient rehab (3hours/day)    Assistance Recommended at Discharge Frequent or constant Supervision/Assistance  Equipment Recommendations  BSC/3in1    Recommendations for Other  Services      Precautions / Restrictions Precautions Precautions: Fall Restrictions Weight Bearing Restrictions: No       Mobility Bed Mobility               General bed mobility comments: pt received and left in chair    Transfers Overall transfer level: Needs assistance Equipment used: Rolling walker (2 wheels) Transfers: Sit to/from Stand Sit to Stand: Min assist           General transfer comment: hand-over-hand and mod verbal cuing for R UE placement on RW. Assist for RW mgmt     Balance Overall balance assessment: Needs assistance Sitting-balance support: No upper extremity supported;Feet supported Sitting balance-Leahy Scale: Good     Standing balance support: Single extremity supported;During functional activity Standing balance-Leahy Scale: Fair                             ADL either performed or assessed with clinical judgement   ADL Overall ADL's : Needs assistance/impaired                                       General ADL Comments: MIN A + RW for functional reaching task in standing - pt reaching at shoulder height to remove kleenex tissue from box. MOD A hand over hand to ball up tissue in R hand, pt repeatedly dropping tissue and requires cues to recognize. MOD A opening lotion bottle using RUE. MOD cues to place lotion on OTs finger -  pt demos difficulty locating finger and recognizing completion of task. MOD cues to remove/replace lid of cup - pt unable to recognize R thumb in way of lid without cuing      Cognition Arousal/Alertness: Awake/alert Behavior During Therapy: WFL for tasks assessed/performed Overall Cognitive Status: Within Functional Limits for tasks assessed                                 General Comments: poor insight into deficits          Exercises Exercises: Other exercises Other Exercises Other Exercises: Pt educated re: DME recs, d/c recs, falls prevention, HEP            Pertinent Vitals/ Pain       Pain Assessment: No/denies pain   Frequency  Min 5X/week        Progress Toward Goals  OT Goals(current goals can now be found in the care plan section)  Progress towards OT goals: Progressing toward goals  Acute Rehab OT Goals Patient Stated Goal: to use his hand better OT Goal Formulation: With patient Time For Goal Achievement: 02/20/21 Potential to Achieve Goals: Good ADL Goals Pt Will Perform Grooming: with min assist;standing Pt Will Transfer to Toilet: with modified independence;ambulating;regular height toilet Pt/caregiver will Perform Home Exercise Program: Increased ROM;Increased strength;With theraputty;Both right and left upper extremity;Independently  Plan Discharge plan remains appropriate;Frequency remains appropriate    Co-evaluation                 AM-PAC OT "6 Clicks" Daily Activity     Outcome Measure   Help from another person eating meals?: A Little Help from another person taking care of personal grooming?: A Little Help from another person toileting, which includes using toliet, bedpan, or urinal?: A Lot Help from another person bathing (including washing, rinsing, drying)?: A Lot Help from another person to put on and taking off regular upper body clothing?: A Little Help from another person to put on and taking off regular lower body clothing?: A Lot 6 Click Score: 15    End of Session Equipment Utilized During Treatment: Rolling walker (2 wheels)  OT Visit Diagnosis: Unsteadiness on feet (R26.81);Other abnormalities of gait and mobility (R26.89);Muscle weakness (generalized) (M62.81)   Activity Tolerance Patient tolerated treatment well   Patient Left in chair;with call bell/phone within reach   Nurse Communication          Time: 2585-2778 OT Time Calculation (min): 25 min  Charges: OT General Charges $OT Visit: 1 Visit OT Treatments $Therapeutic Activity: 23-37 mins  Dessie Coma, M.S.  OTR/L  02/07/21, 4:06 PM  ascom [phone]

## 2021-02-07 NOTE — Assessment & Plan Note (Addendum)
Chronic vision loss.

## 2021-02-07 NOTE — Progress Notes (Addendum)
Physical Therapy Treatment Patient Details Name: Kyle Roberson MRN: [id number] DOB: [birth year] Today's Date: 02/07/2021   History of Present Illness admitted for acute hospitalization s/p L craniotomy with evacuation of SDH (02/05/21).    PT Comments    Gradual progression in gait distance and overall activity tolerance this date.  Mild increase in R lateral lean/listing with fatigue or divided attention.  Consistent cuing for awareness of R UE position and control with all functional activities.  Remains very motivated, engaged with all activities; frequency updated to QD.  Session completed on RA, >90% at rest and with activity.  Left on RA end of session; RN informed/aware.    Recommendations for follow up therapy are one component of a multi-disciplinary discharge planning process, led by the attending physician.  Recommendations may be updated based on patient status, additional functional criteria and insurance authorization.  Follow Up Recommendations  Acute inpatient rehab (3hours/day)     Assistance Recommended at Discharge Frequent or constant Supervision/Assistance  Equipment Recommendations  Rolling walker (2 wheels);BSC/3in1    Recommendations for Other Services       Precautions / Restrictions Precautions Precautions: Fall Restrictions Weight Bearing Restrictions: No     Mobility  Bed Mobility Overal bed mobility: Needs Assistance Bed Mobility: Supine to Sit     Supine to sit: Mod assist     General bed mobility comments: assist to initiate and sequence movement transition, transition towards L    Transfers Overall transfer level: Needs assistance Equipment used: Rolling walker (2 wheels) Transfers: Sit to/from Stand Sit to Stand: Min assist;Mod assist           General transfer comment: hand-over-hand and mod verbal cuing for R UE placement on RW    Ambulation/Gait Ambulation/Gait assistance: Min assist Gait Distance (Feet): 70  Feet Assistive device: Rolling walker (2 wheels)         General Gait Details: R lateral lean/drift with fatigue, min/mod manual assist for L ant/lateral weight shift to optimize R LE step height/length; limited balance, limited visual scanning/awareness of R.  Consistently veering R, min/mod assist from therapist for correction.   Stairs             Wheelchair Mobility    Modified Rankin (Stroke Patients Only)       Balance Overall balance assessment: Needs assistance Sitting-balance support: No upper extremity supported;Feet supported Sitting balance-Leahy Scale: Good     Standing balance support: Bilateral upper extremity supported Standing balance-Leahy Scale: Fair Standing balance comment: lists R with fatigue, divided attention                            Cognition Arousal/Alertness: Awake/alert Behavior During Therapy: WFL for tasks assessed/performed Overall Cognitive Status: Within Functional Limits for tasks assessed                                 General Comments: Oriented to self, location (as hospital when given choices), situation.  Follows commands, increased time for processing, initiation/termination of activity. Mild expressive aphasia.  Slightly more sluggish in response today (wife reports recently taking Benadryl)        Exercises Other Exercises Other Exercises: Unsupported standing at elevated bedside table, participated with functional task involving R UE reaching, coordination and grasp (mod assist); visual scanning (min cuing) and cognitive attention.  Min assist for balance due to R lateral lean/list with fatigue; consistent  cuing for postural extension throughout activity Other Exercises: Seated in recliner, assisted with oral care--required built up handle to toothbrush for manipulation in R UE, did switch to L UE to complete task.  Mild apraxia noted at times; continued inattention to R side with task.     General Comments        Pertinent Vitals/Pain Pain Assessment: No/denies pain    Home Living                          Prior Function            PT Goals (current goals can now be found in the care plan section) Acute Rehab PT Goals Patient Stated Goal: to recover strength and mobility PT Goal Formulation: With patient/family Time For Goal Achievement: 02/20/21 Potential to Achieve Goals: Good Progress towards PT goals: Progressing toward goals    Frequency    7X/week      PT Plan Current plan remains appropriate    Co-evaluation              AM-PAC PT "6 Clicks" Mobility   Outcome Measure  Help needed turning from your back to your side while in a flat bed without using bedrails?: A Little Help needed moving from lying on your back to sitting on the side of a flat bed without using bedrails?: A Lot Help needed moving to and from a bed to a chair (including a wheelchair)?: A Little Help needed standing up from a chair using your arms (e.g., wheelchair or bedside chair)?: A Little Help needed to walk in hospital room?: A Little Help needed climbing 3-5 steps with a railing? : A Lot 6 Click Score: 16    End of Session Equipment Utilized During Treatment: Gait belt Activity Tolerance: Patient tolerated treatment well Patient left: in chair;with call bell/phone within reach;with family/visitor present Nurse Communication: Mobility status PT Visit Diagnosis: Muscle weakness (generalized) (M62.81);Difficulty in walking, not elsewhere classified (R26.2);Hemiplegia and hemiparesis Hemiplegia - Right/Left: Right Hemiplegia - dominant/non-dominant: Dominant Hemiplegia - caused by: Other cerebrovascular disease     Time: 7322-0254 PT Time Calculation (min) (ACUTE ONLY): 34 min  Charges:  $Gait Training: 8-22 mins $Neuromuscular Re-education: 8-22 mins                     Castulo Scarpelli H. Owens Shark, PT, DPT, NCS 02/07/21, 10:23 AM [phone]

## 2021-02-08 DIAGNOSIS — N184 Chronic kidney disease, stage 4 (severe): Secondary | ICD-10-CM

## 2021-02-08 DIAGNOSIS — I10 Essential (primary) hypertension: Secondary | ICD-10-CM

## 2021-02-08 LAB — GLUCOSE, CAPILLARY
Glucose-Capillary: 124 mg/dL — ABNORMAL HIGH (ref 70–99)
Glucose-Capillary: 191 mg/dL — ABNORMAL HIGH (ref 70–99)
Glucose-Capillary: 260 mg/dL — ABNORMAL HIGH (ref 70–99)
Glucose-Capillary: 263 mg/dL — ABNORMAL HIGH (ref 70–99)

## 2021-02-08 NOTE — Plan of Care (Signed)

## 2021-02-08 NOTE — Progress Notes (Signed)
  Progress Note    Kyle Roberson   RDE:[id number]  DOB: October 12, 1940  DOA: 02/04/2021     4 Date of Service: 02/08/2021   Clinical Course  11/15 presented to the hospital.  Craniotomy performed. 11/17 hospitalist consult. 11/19: Waiting for floor bed here and CIR placement for disposition   Assessment and Plan * Subdural hematoma SP left craniotomy for evacuation of subdural hematoma.  Drain removed.  Management per neurosurgery. Waiting for bed on MedSurg.  Chronic a-fib (HCC) Remains in A. fib but rate controlled. Actually has bradycardia. Not on any medication that blocks AV node. Not a candidate for anticoagulation due to SDH. Was on Eliquis before admission which is stopped.  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    Chronic heart failure with preserved ejection fraction (HFpEF) (HCC) Volume stable.  Trace pitting edema present.  Patient was on Zaroxolyn and torsemide. Continue torsemide 60 mg daily.  Last echocardiogram shows 55 to 60% EF.   CKD (chronic kidney disease) stage 4, GFR 15-29 ml/min (HCC) Renal function stable.  Monitor.  Avoid nephrotoxic medication  Obesity (BMI 30-39.9) Counseled for weight loss  Benign non-nodular prostatic hyperplasia with lower urinary tract symptoms Monitor for retention.  Type 2 diabetes mellitus with stage 4 chronic kidney disease (HCC) uncontrolled with hyperglycemia, on long-term insulin  Currently on sliding-scale insulin.  We will continue.  Continue Semglee 10 units daily. Home DM Meds:  Glipizide 10 mg BID NPH Insulin 10 units CBG 200-250 and 15 units CBG >250  Essential hypertension Blood pressure is improving on current regimen. Continue hydralazine, torsemide, Norvasc, as needed hydralazine and labetalol. Adjust as need     Subjective:  Talkative. Worried about hospital bill and how much insurance would cover. Appreciative of his care  Objective Vitals:   02/08/21 1200  02/08/21 1300 02/08/21 1551 02/08/21 1600  BP: (!) 137/55  (!) 154/54 (!) 155/53  Pulse: (!) 49 60 (!) 55 (!) 58  Resp: (!) 29 (!) 23 (!) 21 (!) 25  Temp:  98.1 F (36.7 C)    TempSrc:  Oral    SpO2: 96% 99% 97% 93%  Weight:      Height:       104.3 kg  Vital signs were reviewed and unremarkable except for: Blood pressure: high    Exam Physical Exam   General: Appear in mild distress, no Rash; Oral Mucosa Clear, moist. no Abnormal Neck Mass Or lumps, Conjunctiva normal, left eye vision loss chronic. Cardiovascular: S1 and S2 Present, no Murmur, Respiratory: good respiratory effort, Bilateral Air entry present and CTA, no Crackles, no wheezes Abdomen: Bowel Sound present, Soft and no tenderness Extremities: no Pedal edema Neurology: alert and oriented to time, place, and person Head: incision healing with staples in place.  Labs / Other Information There are no new results to review at this time.   Disposition Plan: Status is: Inpatient  Per primary team. Currently waiting for transfer to floors and CIR at D/C      Time spent: 20 minutes [facility] 02/08/2021, 8:05 PM

## 2021-02-08 NOTE — Progress Notes (Signed)
Physical Therapy Treatment Patient Details Name: Kyle Roberson MRN: [id number] DOB: 19-Feb-1941 Today's Date: 02/08/2021   History of Present Illness admitted for acute hospitalization s/p L craniotomy with evacuation of SDH (02/05/21).    PT Comments    Pt was long sitting in bed upon arriving. He agrees to session and is cooperative throughout. Uses humor to cover up some slight cognition deficits. RN in room and assisted with management of lines during OOB activity. He does c/o L ankle pain that increased during wt bearing activity. Continues to require assistance to exit bed, stand, and ambulate. Ambulated ~ 60 ft with RW prior to ambulate ~ 25 more ft with HHA +2. Poor posture with use or RW that improved without however balance is very unsteady without RW. He requires constant vcs but demonstrated poor carry-over between task. Overall tolerated session well with vital stable. He is a great IP rehab candidate and is excited to go once insurance authorization acquired. Acute PT will continue to progress per current POC progressing as able per pt tolerance.    Recommendations for follow up therapy are one component of a multi-disciplinary discharge planning process, led by the attending physician.  Recommendations may be updated based on patient status, additional functional criteria and insurance authorization.  Follow Up Recommendations  Acute inpatient rehab (3hours/day)     Assistance Recommended at Discharge Frequent or constant Supervision/Assistance  Equipment Recommendations  Rolling walker (2 wheels);BSC/3in1       Precautions / Restrictions Precautions Precautions: Fall Precaution Comments: Intracranial drain intact (cleared for activity per Dr. Alyssa Grove) Restrictions Weight Bearing Restrictions: No     Mobility  Bed Mobility Overal bed mobility: Needs Assistance Bed Mobility: Supine to Sit     Supine to sit: Min assist;HOB elevated     General bed  mobility comments: Min assist to exit L side of bed with HOB elevated    Transfers Overall transfer level: Needs assistance Equipment used: Rolling walker (2 wheels) Transfers: Sit to/from Stand Sit to Stand: Min assist           General transfer comment: Pt required min assist to stand from minimally elevated bed height. Author support RW due to pt not listening to cues to push up from bed    Ambulation/Gait Ambulation/Gait assistance: Min assist;Min guard Gait Distance (Feet): 75 Feet Assistive device: Rolling walker (2 wheels) Gait Pattern/deviations: Step-through pattern;Trunk flexed;Antalgic Gait velocity: decreased     General Gait Details: pt required constant vcs for imporved gait posture and safety. He tends to let RW out in front of him too far. Is able to correct however quickly reverts to flexed posture. Pt c/o increased L ankle pain during gait. He ambulated last 25 ft without AD however with +2 UE HHA. improved posture but pt much more unsteady.     Balance Overall balance assessment: Needs assistance Sitting-balance support: No upper extremity supported;Feet supported Sitting balance-Leahy Scale: Good     Standing balance support: During functional activity;Reliant on assistive device for balance;Bilateral upper extremity supported Standing balance-Leahy Scale: Fair Standing balance comment: fair with BUE support on AD, poor without use of AD or  BUE HHA      Cognition Arousal/Alertness: Awake/alert Behavior During Therapy: WFL for tasks assessed/performed Overall Cognitive Status: Within Functional Limits for tasks assessed      General Comments: Pt is A but uses humor to cover some cognition deficits. Poor overall insight of deficits but was able to consistently follow one step commands. Does have poor carryover  between cueing           General Comments General comments (skin integrity, edema, etc.): discussed CIR recommendation and pt still very  motivated to go and get better      Pertinent Vitals/Pain Pain Assessment: 0-10 Pain Score: 4  Pain Location: L ankle Pain Descriptors / Indicators: Aching;Discomfort;Guarding Pain Intervention(s): Limited activity within patient's tolerance;Monitored during session;Repositioned    Home Living Family/patient expects to be discharged to:: Private residence Living Arrangements: Spouse/significant other            PT Goals (current goals can now be found in the care plan section) Acute Rehab PT Goals Patient Stated Goal: get better so I can go home Progress towards PT goals: Progressing toward goals    Frequency    7X/week      PT Plan Current plan remains appropriate       AM-PAC PT "6 Clicks" Mobility   Outcome Measure  Help needed turning from your back to your side while in a flat bed without using bedrails?: A Little Help needed moving from lying on your back to sitting on the side of a flat bed without using bedrails?: A Lot Help needed moving to and from a bed to a chair (including a wheelchair)?: A Little Help needed standing up from a chair using your arms (e.g., wheelchair or bedside chair)?: A Little Help needed to walk in hospital room?: A Little Help needed climbing 3-5 steps with a railing? : A Lot 6 Click Score: 16    End of Session Equipment Utilized During Treatment: Gait belt Activity Tolerance: Patient tolerated treatment well Patient left: in chair;with call bell/phone within reach;with family/visitor present Nurse Communication: Mobility status PT Visit Diagnosis: Muscle weakness (generalized) (M62.81);Difficulty in walking, not elsewhere classified (R26.2);Hemiplegia and hemiparesis Hemiplegia - Right/Left: Right Hemiplegia - dominant/non-dominant: Dominant Hemiplegia - caused by: Other cerebrovascular disease     Time: 0981-1914 PT Time Calculation (min) (ACUTE ONLY): 17 min  Charges:  $Gait Training: 8-22 mins                     Julaine Fusi PTA 02/08/21, 4:13 PM

## 2021-02-08 NOTE — Progress Notes (Signed)
   Progress Note  History: Kyle Roberson is s/p left craniotomy for evacuation of subdural hematoma  POD#4: Doing well - working with PTOT  POD#3: drain removed yesterday. NAEO  POD#2: Some incisional pain overnight.  POD#1: NAEO. Pt reports good pain control.   Physical Exam: Vitals:   02/08/21 0500 02/08/21 0600  BP: (!) 147/64 (!) 145/57  Pulse: (!) 57 (!) 48  Resp: (!) 23 (!) 25  Temp:    SpO2: 97% 99%    AA Ox3; mild dysarthria  CNI except left eye blindness (baseline) Strength:5/5 throughout  No pronator drift Incision c/d/I with staples in place   Data:  Recent Labs  Lab 02/02/21 2200 02/05/21 0422  NA 138 140  K 3.5 4.0  CL 104 107  CO2 26 25  BUN 39* 36*  CREATININE 2.04* 1.90*  GLUCOSE 261* 246*  CALCIUM 8.8* 8.4*   No results for input(s): AST, ALT, ALKPHOS in the last 168 hours.  Invalid input(s): TBILI   Recent Labs  Lab 02/02/21 2200 02/05/21 0422  WBC 7.3 6.5  HGB 12.8* 12.6*  HCT 39.8 38.4*  PLT 222 219   No results for input(s): APTT, INR in the last 168 hours.       Other tests/results:  CT head 02/05/21 IMPRESSION: 1. Interval left-sided craniotomy with surgical drain in the scalp and underlying reduction in the left cerebral convexity mixed-density subdural hematoma. 2. Scattered air pockets in the collection with decreased left-to-right midline shift now 3 mm. 3. There is decreased mass effect on the left cerebral hemisphere with decreased gyral crowding. There is no downward mass effect. 4. Atrophy and small vessel changes.   Electronically Signed   By: Telford Nab M.D.   On: 02/05/2021 06:03  Assessment/Plan:  Kyle Roberson is a 80 y.o presenting with chronic SDH with midline shift s/p left craniotomy for evacuation.   - mobilize - pain control - started SSI - DVT prophylaxis with SQH - PTOT; no activity restrictions  - drain removed 02/06/21 - hospitalist consulted for medical management - on  floor status - dispo planning underway  [name] Department of Neurosurgery

## 2021-02-09 DIAGNOSIS — N1832 Chronic kidney disease, stage 3b: Secondary | ICD-10-CM

## 2021-02-09 DIAGNOSIS — S065XAA Traumatic subdural hemorrhage with loss of consciousness status unknown, initial encounter: Principal | ICD-10-CM

## 2021-02-09 DIAGNOSIS — N401 Enlarged prostate with lower urinary tract symptoms: Secondary | ICD-10-CM

## 2021-02-09 DIAGNOSIS — I4819 Other persistent atrial fibrillation: Secondary | ICD-10-CM

## 2021-02-09 DIAGNOSIS — I5032 Chronic diastolic (congestive) heart failure: Secondary | ICD-10-CM

## 2021-02-09 DIAGNOSIS — E1122 Type 2 diabetes mellitus with diabetic chronic kidney disease: Secondary | ICD-10-CM

## 2021-02-09 LAB — GLUCOSE, CAPILLARY
Glucose-Capillary: 150 mg/dL — ABNORMAL HIGH (ref 70–99)
Glucose-Capillary: 199 mg/dL — ABNORMAL HIGH (ref 70–99)
Glucose-Capillary: 264 mg/dL — ABNORMAL HIGH (ref 70–99)
Glucose-Capillary: 223 mg/dL — ABNORMAL HIGH (ref 70–99)

## 2021-02-09 MED ORDER — INSULIN ASPART 100 UNIT/ML IJ SOLN
3.0000 [IU] | Freq: Three times a day (TID) | INTRAMUSCULAR | Status: DC
  Administered 2021-02-09 – 2021-02-11 (×6): 3 [IU] via SUBCUTANEOUS
  Filled 2021-02-09 (×6): qty 1

## 2021-02-09 MED ORDER — AMLODIPINE BESYLATE 5 MG PO TABS: 5.0000 mg | ORAL_TABLET | Freq: Every day | ORAL | Status: DC

## 2021-02-09 NOTE — Assessment & Plan Note (Signed)
Remains in A. fib but rate controlled. Actually has bradycardia for stopping Norvasc.  Wife is requested [name] to stop by so I have passed on message to him.  She mentioned about the discussion for pacemaker at the last office visit. Not a candidate for anticoagulation due to SDH. Was on Eliquis before admission which is stopped.

## 2021-02-09 NOTE — Assessment & Plan Note (Signed)
Chronic vision loss.

## 2021-02-09 NOTE — Assessment & Plan Note (Signed)
Counseled for weight loss

## 2021-02-09 NOTE — Progress Notes (Signed)
Physical Therapy Treatment Patient Details Name: Kyle Roberson MRN: [id number] DOB: Apr 23, 1940 Today's Date: 02/09/2021   History of Present Illness admitted for acute hospitalization s/p L craniotomy with evacuation of SDH (02/05/21).    PT Comments    Pt seen for PT tx with pt received asleep in bed & irritated that PT woke him up. Pt very reluctantly agreeable to tx, able to perform supine>sit with min assist. Pt completes stand pivot to recliner with RW & CGA<>min assist so attempted to ambulate without AD as pt reports he didn't use AD prior to admission. PT provided max encouragement/education re: attempting gait without AD but then pt with inconsistent reporting of being dependent on a RW. Pt does ambulate to door & back without UE support & min assist with wide BOS, decreased step length & stride length but anxious & frequently looking for objects to hold. Pt becoming increasingly irritated with PT so pt left in recliner & educated on need to sit up for awhile. Notified nurse of session events. Will continue to follow pt acutely to address high level balance & gait with LRAD.     Recommendations for follow up therapy are one component of a multi-disciplinary discharge planning process, led by the attending physician.  Recommendations may be updated based on patient status, additional functional criteria and insurance authorization.  Follow Up Recommendations  Acute inpatient rehab (3hours/day)     Assistance Recommended at Discharge Frequent or constant Supervision/Assistance  Equipment Recommendations  Rolling walker (2 wheels);BSC/3in1    Recommendations for Other Services       Precautions / Restrictions Precautions Precautions: Fall Restrictions Weight Bearing Restrictions: No     Mobility  Bed Mobility Overal bed mobility: Needs Assistance Bed Mobility: Supine to Sit     Supine to sit: HOB elevated;Min assist     General bed mobility comments: assistance to  upright trunk    Transfers Overall transfer level: Needs assistance Equipment used: Rolling walker (2 wheels) Transfers: Sit to/from Stand;Bed to chair/wheelchair/BSC Sit to Stand: Min assist Stand pivot transfers: Min assist (with RW, stand pivot bed>recliner)              Ambulation/Gait Ambulation/Gait assistance: Min assist Gait Distance (Feet): 15 Feet Assistive device: None Gait Pattern/deviations: Wide base of support;Decreased step length - left;Decreased step length - right;Decreased dorsiflexion - right;Decreased dorsiflexion - left;Decreased stride length Gait velocity: decreased     General Gait Details: Pt fearful of ambulating without UE support, occasionally reaching for furniture/door for support   Stairs             Wheelchair Mobility    Modified Rankin (Stroke Patients Only)       Balance Overall balance assessment: Needs assistance Sitting-balance support: No upper extremity supported;Feet supported Sitting balance-Leahy Scale: Good     Standing balance support: No upper extremity supported;During functional activity Standing balance-Leahy Scale: Fair                              Cognition Arousal/Alertness: Awake/alert Behavior During Therapy: Agitated Overall Cognitive Status: Within Functional Limits for tasks assessed                                 General Comments: Pt AxOx 4 but very irritated that therapist woke him from a nap, frustrated that PT wants him to try to ambulate without AD  Exercises      General Comments General comments (skin integrity, edema, etc.): Pt received on 2L/min SpO2 100%, pt placed on room air for session (after being cleared by nurse) with lowest SpO2 reading of 87% but pt able to increase to WNL with cuing for pursed lip breathing.      Pertinent Vitals/Pain Pain Assessment: No/denies pain    Home Living                          Prior Function             PT Goals (current goals can now be found in the care plan section) Acute Rehab PT Goals Patient Stated Goal: get better so I can go home PT Goal Formulation: With patient/family Time For Goal Achievement: 02/20/21 Potential to Achieve Goals: Good Progress towards PT goals: Progressing toward goals    Frequency    7X/week      PT Plan Current plan remains appropriate    Co-evaluation              AM-PAC PT "6 Clicks" Mobility   Outcome Measure  Help needed turning from your back to your side while in a flat bed without using bedrails?: None Help needed moving from lying on your back to sitting on the side of a flat bed without using bedrails?: A Little Help needed moving to and from a bed to a chair (including a wheelchair)?: A Little Help needed standing up from a chair using your arms (e.g., wheelchair or bedside chair)?: A Little Help needed to walk in hospital room?: A Little Help needed climbing 3-5 steps with a railing? : A Lot 6 Click Score: 18    End of Session Equipment Utilized During Treatment: Gait belt Activity Tolerance: Treatment limited secondary to agitation Patient left: in chair;with call bell/phone within reach Nurse Communication: Mobility status (irritation during session) PT Visit Diagnosis: Muscle weakness (generalized) (M62.81);Difficulty in walking, not elsewhere classified (R26.2);Hemiplegia and hemiparesis;Unsteadiness on feet (R26.81) Hemiplegia - Right/Left: Right Hemiplegia - dominant/non-dominant: Dominant Hemiplegia - caused by: Other cerebrovascular disease     Time: 1350-1406 PT Time Calculation (min) (ACUTE ONLY): 16 min  Charges:  $Neuromuscular Re-education: 8-22 mins                    Lavone Nian, PT, DPT 02/09/21, 2:36 PM   [name] 02/09/2021, 2:34 PM

## 2021-02-09 NOTE — Assessment & Plan Note (Signed)
Monitor for retention.

## 2021-02-09 NOTE — Progress Notes (Signed)
   Progress Note  History: Kyle Roberson is s/p left craniotomy for evacuation of subdural hematoma  POD#5: Doing well neurologically.  Had some bradycardia overnight  POD#4: Doing well - working with PTOT  POD#3: drain removed yesterday. NAEO  POD#2: Some incisional pain overnight.  POD#1: NAEO. Pt reports good pain control.   Physical Exam: Vitals:   02/09/21 0350 02/09/21 0800  BP: (!) 142/70 (!) 153/63  Pulse: (!) 50 (!) 50  Resp: (!) 26 18  Temp:    SpO2: 95% 95%    AA Ox3; mild dysarthria  CNI except left eye blindness (baseline) Strength:5/5 throughout  No pronator drift Incision c/d/I with staples in place   Data:  Recent Labs  Lab 02/02/21 2200 02/05/21 0422  NA 138 140  K 3.5 4.0  CL 104 107  CO2 26 25  BUN 39* 36*  CREATININE 2.04* 1.90*  GLUCOSE 261* 246*  CALCIUM 8.8* 8.4*   No results for input(s): AST, ALT, ALKPHOS in the last 168 hours.  Invalid input(s): TBILI   Recent Labs  Lab 02/02/21 2200 02/05/21 0422  WBC 7.3 6.5  HGB 12.8* 12.6*  HCT 39.8 38.4*  PLT 222 219   No results for input(s): APTT, INR in the last 168 hours.       Other tests/results:  CT head 02/05/21 IMPRESSION: 1. Interval left-sided craniotomy with surgical drain in the scalp and underlying reduction in the left cerebral convexity mixed-density subdural hematoma. 2. Scattered air pockets in the collection with decreased left-to-right midline shift now 3 mm. 3. There is decreased mass effect on the left cerebral hemisphere with decreased gyral crowding. There is no downward mass effect. 4. Atrophy and small vessel changes.   Electronically Signed   By: Telford Nab M.D.   On: 02/05/2021 06:03  Assessment/Plan:  Kyle Roberson is a 80 y.o presenting with chronic SDH with midline shift s/p left craniotomy for evacuation.   - mobilize - pain control - started SSI - DVT prophylaxis with SQH - PTOT; no activity restrictions  - drain removed  02/06/21 - hospitalist consulted for medical management - appreciate help - on floor status - dispo planning underway  [name] Department of Neurosurgery

## 2021-02-09 NOTE — Assessment & Plan Note (Signed)
Renal function stable.  Monitor.  Avoid nephrotoxic medication

## 2021-02-09 NOTE — Progress Notes (Signed)
Inpatient Diabetes Program Recommendations  AACE/ADA: New Consensus Statement on Inpatient Glycemic Control (2015)  Target Ranges:  Prepandial:   less than 140 mg/dL      Peak postprandial:   less than 180 mg/dL (1-2 hours)      Critically ill patients:  140 - 180 mg/dL    Latest Reference Range & Units 02/08/21 07:29 02/08/21 11:27 02/08/21 16:28 02/08/21 21:02  Glucose-Capillary 70 - 99 mg/dL 124 (H)  1 unit Novolog  10 units Semglee @0858   191 (H)  2 units Novolog  260 (H)  5 units Novolog  263 (H)  (H): Data is abnormally high  Latest Reference Range & Units 02/09/21 07:43  Glucose-Capillary 70 - 99 mg/dL 150 (H)  (H): Data is abnormally high   Home DM Meds: Glipizide 10 mg BID                             NPH Insulin 10 units CBG 200-250 and 15 units CBG >250   Current Orders: Semglee 10 units Daily                           Novolog Sensitive Correction Scale/ SSI (0-9 units) TID AC      MD- Note AM CBGs are OK.  Having some elevations in the afternoon.  Eating 90-100% of meals yesterday 11/19.  Please consider adding Novolog Meal Coverage: Novolog 3 units TID with meals  Hold if pt eats <50% of meal, Hold if pt NPO     --Will follow patient during hospitalization--  Wyn Quaker RN, MSN, CDE Diabetes Coordinator Inpatient Glycemic Control Team Team Pager: [phone] (8a-5p)

## 2021-02-09 NOTE — Progress Notes (Signed)
Progress Note    Kyle Roberson   YWV:[id number]  DOB: 1941/01/13  DOA: 02/04/2021     5 Date of Service: 02/09/2021   Clinical Course  11/15 presented to the hospital.  Craniotomy performed. 11/17 hospitalist consult. 11/19: Waiting for floor bed here and CIR placement for disposition 11/20: Blood sugar trending minimally up.  Added NovoLog 3 units 3 times daily with meals.  Heart rate running slow Norvasc stopped.     Assessment and Plan * Subdural hematoma SP left craniotomy for evacuation of subdural hematoma.  Drain removed.  Management per neurosurgery. Waiting for MedSurg bed and CIR placement at discharge  Chronic a-fib ([name]) Remains in A. fib but rate controlled. Actually has bradycardia for stopping Norvasc.  Wife is requested Dr. Rockey Roberson to stop by so I have passed on message to him.  She mentioned about the discussion for pacemaker at the last office visit. Not a candidate for anticoagulation due to SDH. Was on Eliquis before admission which is stopped.  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    Chronic heart failure with preserved ejection fraction (HFpEF) (HCC) Volume stable.  Trace pitting edema present.  Patient was on Zaroxolyn and torsemide. Continue torsemide 60 mg daily.  Last echocardiogram shows 55 to 60% EF.   CKD (chronic kidney disease) stage 4, GFR 15-29 ml/min (HCC) Renal function stable.  Monitor.  Avoid nephrotoxic medication  Obesity (BMI 30-39.9) Counseled for weight loss  Benign non-nodular prostatic hyperplasia with lower urinary tract symptoms Monitor for retention.  Type 2 diabetes mellitus with stage 4 chronic kidney disease (HCC) uncontrolled with hyperglycemia, on long-term insulin  Currently on sliding-scale insulin. Continue Semglee 10 units daily.  Sugar trending up so adding NovoLog 3 units subcu 3 times daily with meals per diabetic nurse recommendation.  A1c 7.3 Home DM Meds:  Glipizide 10 mg  BID NPH Insulin 10 units CBG 200-250 and 15 units CBG >250  Essential hypertension Blood pressure is improving on current regimen. Continue hydralazine, torsemide, as needed hydralazine and labetalol. Adjust as need.  Stopping Norvasc as he was bradycardic overnight.  Wife requesting Dr. Rockey Roberson to stop by as patient follows with him in the office and there was discussion about consideration of pacemaker.  I have conveyed the message to Dr. Rockey Roberson who will see him     Subjective:  No new complaints.  Heart rate was in 30s to 50s while he was sleeping but asymptomatic.  Sugars trending up.  Family member at bedside  Objective Vitals:   02/08/21 2138 02/09/21 0000 02/09/21 0350 02/09/21 0800  BP: (!) 169/62 (!) 159/62 (!) 142/70 (!) 153/63  Pulse:  (!) 48 (!) 50 (!) 50  Resp:  (!) 26 (!) 26 18  Temp:      TempSrc:      SpO2:  100% 95% 95%  Weight:      Height:       104.3 kg  Vital signs were reviewed and unremarkable except for: Pulse: Low    Exam Physical Exam   General:Appear in mild distress, no Rash; Oral Mucosa Clear, moist. no Abnormal Neck Mass Or lumps, Conjunctiva normal, left eye vision loss chronic. Cardiovascular:S1 and S2 Present, no Murmur, Respiratory:good respiratory effort, Bilateral Air entry present and CTA, no Crackles, no wheezes Abdomen:Bowel Sound present, Soft and no tenderness Extremities: no Pedal edema Neurology:alert and oriented to time, place, and person Head: incision healing with staples in place.  Labs / Other Information My review  of labs, imaging, notes and other tests is significant for Blood sugar trending up     Disposition Plan: Status is: Inpatient  Remains inpatient appropriate because: Waiting for CIR placement in MedSurg bed transfer    Updated family over at bedside    Time spent: 25 minutes [facility] 02/09/2021, 1:01 PM

## 2021-02-09 NOTE — Assessment & Plan Note (Signed)
SP left craniotomy for evacuation of subdural hematoma.  Drain removed.  Management per neurosurgery. Waiting for MedSurg bed and CIR placement at discharge

## 2021-02-09 NOTE — Assessment & Plan Note (Signed)
Volume stable.  Trace pitting edema present.  Patient was on Zaroxolyn and torsemide. Continue torsemide 60 mg daily.  Last echocardiogram shows 55 to 60% EF.

## 2021-02-09 NOTE — Assessment & Plan Note (Signed)
Blood pressure is improving on current regimen. Continue hydralazine, torsemide, as needed hydralazine and labetalol. Adjust as need.  Stopping Norvasc as he was bradycardic overnight.  Wife requesting [name] to stop by as patient follows with him in the office and there was discussion about consideration of pacemaker.  I have conveyed the message to [name] who will see him

## 2021-02-09 NOTE — Consult Note (Signed)
Cardiology Consultation:   Patient ID: Kyle Roberson MRN: [id number]; DOB: 23-Jun-1940  Admit date: 02/04/2021 Date of Consult: 02/09/2021  PCP:  Glean Hess, MD   [facility] Providers Cardiologist:  Rockey Situ Physician requesting consult: Dr. Brigitte Pulse Reason for consult: Bradycardia   Patient Profile:   Kyle Roberson is a 80 y.o. male with history of  coronary artery disease,  Permanent atrial fibrillation,  hypertension,  hyperlipidemia,  type 2 diabetes mellitus,  chronic kidney disease,  GERD, and  hyperparathyroidism,  History of falls Chronic lower extremity edema Bradycardia as outpatient, rates in the 5s Recovering from craniotomy for left subdural hematoma evacuation following a fall, consult for bradycardia   History of Present Illness:   Kyle Roberson had a fall resulting in a closed head injury about 3 months ago.  head CT did not show any acute concerning findings.   -Repeat CT scan which showed a chronic left-sided subdural hematoma with left-to-right midline shift. Reported symptoms of lethargy, weakness right hand that was worse, speech difficulty, worsening symptoms over 4 days Underwent CRANIOTOMY FOR LEFT SUBDURAL  HEMATOMA EVACUATION (Left)  In recovery felt to be doing well neurologically, working with PT and OT Drain has been removed Note made of his bradycardia, asymptomatic, hemodynamically stable  On discussion is lying supine, in good spirits, no new complaints  On prior clinic visits September 2022, April 2022, June 2021, heart rate in the 40s, asymptomatic   Past Medical History:  Diagnosis Date   AKI (acute kidney injury) ([place]) 07/06/2018   CAD (coronary artery disease)    Cellulitis of lower extremity 05/31/2019   Diabetes ([place])    GERD (gastroesophageal reflux disease)    HLD (hyperlipidemia)    HTN (hypertension)     Past Surgical History:  Procedure Laterality Date   BLEPHAROPLASTY     CARDIAC CATHETERIZATION      CORONARY STENT INTERVENTION     CRANIOTOMY Left 02/04/2021   Procedure: CRANIOTOMY FOR LEFT SUBDURAL  HEMATOMA EVACUATION;  Surgeon: Deetta Perla, MD;  Location: [facility] ORS;  Service: Neurosurgery;  Laterality: Left;   CYSTOSCOPY     HERNIA REPAIR       Home Medications:  Prior to Admission medications   Medication Sig Start Date End Date Taking? Authorizing Provider  Ascorbic Acid (VITAMIN C PO) Take 1 tablet by mouth in the morning.   Yes [provider]  Cholecalciferol (VITAMIN D3 PO) Take 1 tablet by mouth in the morning.   Yes [provider]  Coenzyme Q10-Vitamin E (QUNOL ULTRA COQ10 PO) Take 1 capsule by mouth in the morning.   Yes [provider]  dorzolamide-timolol (COSOPT) 22.3-6.8 MG/ML ophthalmic solution Place 1 drop into the left eye 2 (two) times daily. 08/28/20  Yes [provider]  Flaxseed, Linseed, (FLAXSEED OIL PO) Take 1 capsule by mouth in the morning.   Yes [provider]  glipiZIDE (GLUCOTROL XL) 10 MG 24 hr tablet TAKE 1 TABLET(10 MG) BY MOUTH TWICE DAILY 01/14/21  Yes Glean Hess, MD  hydrALAZINE (APRESOLINE) 100 MG tablet Take 1 tablet (100 mg total) by mouth 3 (three) times daily. 06/11/20  Yes Brylee Berk, Kathlene November, MD  insulin NPH Human (NOVOLIN N) 100 UNIT/ML injection Inject 10-15 Units into the skin daily. Take 10-15 units twice a day after breakfast and before bedtime per Sliding Scale (BS>250: 15 units, BS= 200-250:10 units) 11/30/17  Yes [provider]  levETIRAcetam (KEPPRA) 500 MG tablet Take 1 tablet (500 mg total) by mouth 2 (  two) times daily for 7 days. 01/29/21 02/05/21 Yes Nance Pear, MD  Multiple Vitamin (MULTIVITAMIN WITH MINERALS) TABS tablet Take 1 tablet by mouth in the morning.   Yes [provider]  nitroGLYCERIN (NITROLINGUAL) 0.4 MG/SPRAY spray Place 1 spray under the tongue as directed. 07/19/19 02/04/24 Yes Glean Hess, MD  Omega-3 Fatty Acids (FISH OIL PO) Take 1  capsule by mouth in the morning.   Yes [provider]  rosuvastatin (CRESTOR) 5 MG tablet TAKE 1 TABLET BY MOUTH 3 TIMES A WEEK 01/14/21  Yes Glean Hess, MD  torsemide (DEMADEX) 20 MG tablet Take 3 tablets (60 mg total) by mouth daily. 06/11/20  Yes Cori Henningsen, Kathlene November, MD  Turmeric (QC TUMERIC COMPLEX PO) Take 1 capsule by mouth in the morning. Qunol Turmeric Curcumin   Yes [provider]  ELIQUIS 2.5 MG TABS tablet TAKE 1 TABLET(2.5 MG) BY MOUTH TWICE DAILY 01/14/21   Minna Merritts, MD  metolazone (ZAROXOLYN) 2.5 MG tablet TAKE 1 TABLET BY MOUTH EVERY DAY AS NEEDED FOR VHQIO>962 06/12/20   Minna Merritts, MD  potassium chloride SA (KLOR-CON) 20 MEQ tablet Take 1 tablet (20 mEq total) by mouth daily as needed. Patient taking differently: Take 20 mEq by mouth daily as needed (with metalozone). 06/11/20   Minna Merritts, MD    Inpatient Medications: Scheduled Meds:  Chlorhexidine Gluconate Cloth  6 each Topical Daily   dorzolamide-timolol  1 drop Left Eye BID   heparin injection (subcutaneous)  5,000 Units Subcutaneous Q12H   hydrALAZINE  100 mg Oral TID   insulin aspart  0-9 Units Subcutaneous TID WC   insulin aspart  3 Units Subcutaneous TID WC   insulin glargine-yfgn  10 Units Subcutaneous Daily   levETIRAcetam  500 mg Oral BID   senna  1 tablet Oral BID   torsemide  60 mg Oral Daily   Continuous Infusions:  PRN Meds: acetaminophen **OR** acetaminophen, bisacodyl, butalbital-acetaminophen-caffeine, diphenhydrAMINE, hydrALAZINE, hydrocortisone cream, naLOXone (NARCAN)  injection, ondansetron **OR** ondansetron (ZOFRAN) IV, oxyCODONE, polyethylene glycol, promethazine, sodium phosphate  Allergies:    Allergies  Allergen Reactions   Atorvastatin Hives and Itching   Simvastatin Hives and Itching    Social History:   Social History   Socioeconomic History   Marital status: Married    Spouse name: Not on file   Number of children: Not on file    Years of education: Not on file   Highest education level: Not on file  Occupational History   Not on file  Tobacco Use   Smoking status: Former   Smokeless tobacco: Never  Vaping Use   Vaping Use: Never used  Substance and Sexual Activity   Alcohol use: Not Currently    Alcohol/week: 2.0 standard drinks    Types: 2 Glasses of wine per week   Drug use: Not Currently   Sexual activity: Not on file  Other Topics Concern   Not on file  Social History Narrative   Lives at home with wife   Social Determinants of Health   Financial Resource Strain: Not on file  Food Insecurity: Not on file  Transportation Needs: Not on file  Physical Activity: Not on file  Stress: Not on file  Social Connections: Not on file  Intimate Partner Violence: Not on file    Family History:    Family History  Problem Relation Age of Onset   Pancreatic cancer Mother    CAD Father    Diabetes Brother  ROS:  Please see the history of present illness.  Review of Systems  Constitutional:  Positive for malaise/fatigue.  HENT: Negative.    Respiratory: Negative.    Cardiovascular: Negative.   Gastrointestinal: Negative.   Musculoskeletal: Negative.   Neurological: Negative.   Psychiatric/Behavioral: Negative.    All other systems reviewed and are negative.   Physical Exam/Data:   Vitals:   02/08/21 2138 02/09/21 0000 02/09/21 0350 02/09/21 0800  BP: (!) 169/62 (!) 159/62 (!) 142/70 (!) 153/63  Pulse:  (!) 48 (!) 50 (!) 50  Resp:  (!) 26 (!) 26 18  Temp:      TempSrc:      SpO2:  100% 95% 95%  Weight:      Height:        Intake/Output Summary (Last 24 hours) at 02/09/2021 1602 Last data filed at 02/09/2021 0600 Gross per 24 hour  Intake 120 ml  Output 800 ml  Net -680 ml   Last 3 Weights 02/04/2021 02/02/2021 01/28/2021  Weight (lbs) 230 lb 230 lb 230 lb  Weight (kg) 104.327 kg 104.327 kg 104.327 kg     Body mass index is 34.97 kg/m.  General:  Well nourished, well  developed, in no acute distress HEENT: normal Neck: no JVD Vascular: No carotid bruits; Distal pulses 2+ bilaterally Cardiac:  normal S1, S2; RRR; no murmur  Lungs:  clear to auscultation bilaterally, no wheezing, rhonchi or rales  Abd: soft, nontender, no hepatomegaly  Ext: no edema Musculoskeletal:  No deformities, BUE and BLE strength normal and equal Skin: warm and dry  Neuro:  CNs 2-12 intact, no focal abnormalities noted Psych:  Normal affect   EKG:  The EKG was personally reviewed and demonstrates:   Atrial fibrillation ventricular rate 64 bpm  Telemetry:  Telemetry was personally reviewed and demonstrates:   Atrial fibrillation ventricular rate 40-50  Relevant CV Studies: Echocardiogram February 2021  1. Left ventricular ejection fraction, by estimation, is 55 to 60%. The  left ventricle has normal function. The left ventricle has no regional  wall motion abnormalities. There is mild left ventricular hypertrophy.  Left ventricular diastolic parameters  are indeterminate.   2. Right ventricular systolic function is normal. The right ventricular  size is normal. There is moderately elevated pulmonary artery systolic  pressure. The estimated right ventricular systolic pressure is 85.6 mmHg.   3. Left atrial size was moderately dilated.   4. The mitral valve is normal in structure and function. Mild to moderate  mitral valve regurgitation. No evidence of mitral stenosis.   5. The aortic valve is normal in structure and function. Aortic valve  regurgitation is mild. Mild aortic valve sclerosis is present, with no  evidence of aortic valve stenosis.   6. Moderately dilated pulmonary artery.   7. The inferior vena cava is dilated in size with <50% respiratory  variability, suggesting right atrial pressure of 15 mmHg.   Laboratory Data:  High Sensitivity Troponin:  No results for input(s): TROPONINIHS in the last 720 hours.   Chemistry Recent Labs  Lab 02/02/21 2200  02/05/21 0422  NA 138 140  K 3.5 4.0  CL 104 107  CO2 26 25  GLUCOSE 261* 246*  BUN 39* 36*  CREATININE 2.04* 1.90*  CALCIUM 8.8* 8.4*  GFRNONAA 32* 35*  ANIONGAP 8 8    No results for input(s): PROT, ALBUMIN, AST, ALT, ALKPHOS, BILITOT in the last 168 hours. Lipids No results for input(s): CHOL, TRIG, HDL, LABVLDL, LDLCALC, CHOLHDL in  the last 168 hours.  Hematology Recent Labs  Lab 02/02/21 2200 02/05/21 0422  WBC 7.3 6.5  RBC 4.24 4.13*  HGB 12.8* 12.6*  HCT 39.8 38.4*  MCV 93.9 93.0  MCH 30.2 30.5  MCHC 32.2 32.8  RDW 13.5 13.4  PLT 222 219   Thyroid No results for input(s): TSH, FREET4 in the last 168 hours.  BNPNo results for input(s): BNP, PROBNP in the last 168 hours.  DDimer No results for input(s): DDIMER in the last 168 hours.   Radiology/Studies:  No results found.   Assessment and Plan:   Persistent atrial fibrillation -Prior documentation dating back a year or 2 of slow ventricular rate For this reason all beta-blockers, calcium channel blockers (diltiazem/verapamil) have been held -Eliquis has been held in the setting of falls, subdural hematoma -Given stable blood pressure, asymptomatic, no indication for pacer Appears to have reasonable chronotropic competence, with movement, talking, increase in heart rate into the low 60s   Chronic diastolic CHF As outpatient on torsemide 40-60 daily Renal function stable  Morbid obesity Diet restriction recommended, unable to exercise   Essential hypertension As outpatient taking hydralazine 100 3 times daily, torsemide  60 Would avoid calcium channel blockers given leg swelling, bradycardia Avoid beta-blockers --- Additional options for hypertension control include Cardura, isosorbide  Chronic renal failure stage IIIb Stable renal function, creatinine 1.9 Will continue torsemide 40-60 daily  Diabetes type 2 with complications Stable, W1X 7.3  Leg swelling Likely component of venous  insufficiency, unable to exclude lymphedema Compression hose if able     Total encounter time more than 110 minutes  Greater than 50% was spent in counseling and coordination of care with the patient    For questions or updates, please contact [facility] Please consult [email or web address] for contact info under    Signed, Ida Rogue, MD  02/09/2021 4:02 PM

## 2021-02-09 NOTE — Assessment & Plan Note (Addendum)
Currently on sliding-scale insulin. Continue Semglee 10 units daily.  Sugar trending up so adding NovoLog 3 units subcu 3 times daily with meals per diabetic nurse recommendation.  A1c 7.3 Home DM Meds:  Glipizide 10 mg BID NPH Insulin 10 units CBG 200-250 and 15 units CBG >250

## 2021-02-09 NOTE — Progress Notes (Signed)
Pt Hr dropping to the 30s while sleeping. B/p stable and when awakened, pt alert and oriented and HR comes up to 60s. Notified MD. Pt being closely watched

## 2021-02-10 ENCOUNTER — Inpatient Hospital Stay: Payer: Medicare HMO

## 2021-02-10 DIAGNOSIS — I482 Chronic atrial fibrillation, unspecified: Secondary | ICD-10-CM

## 2021-02-10 LAB — CBC
HCT: 37.4 % — ABNORMAL LOW (ref 39.0–52.0)
Hemoglobin: 12.1 g/dL — ABNORMAL LOW (ref 13.0–17.0)
MCH: 29.8 pg (ref 26.0–34.0)
MCHC: 32.4 g/dL (ref 30.0–36.0)
MCV: 92.1 fL (ref 80.0–100.0)
Platelets: 234 10*3/uL (ref 150–400)
RBC: 4.06 MIL/uL — ABNORMAL LOW (ref 4.22–5.81)
RDW: 13.2 % (ref 11.5–15.5)
WBC: 7 10*3/uL (ref 4.0–10.5)
nRBC: 0 % (ref 0.0–0.2)

## 2021-02-10 LAB — BASIC METABOLIC PANEL
Anion gap: 7 (ref 5–15)
BUN: 47 mg/dL — ABNORMAL HIGH (ref 8–23)
CO2: 30 mmol/L (ref 22–32)
Calcium: 8.5 mg/dL — ABNORMAL LOW (ref 8.9–10.3)
Chloride: 100 mmol/L (ref 98–111)
Creatinine, Ser: 1.91 mg/dL — ABNORMAL HIGH (ref 0.61–1.24)
GFR, Estimated: 35 mL/min — ABNORMAL LOW (ref >60)
Glucose, Bld: 153 mg/dL — ABNORMAL HIGH (ref 70–99)
Potassium: 3.6 mmol/L (ref 3.5–5.1)
Sodium: 137 mmol/L (ref 135–145)

## 2021-02-10 LAB — GLUCOSE, CAPILLARY
Glucose-Capillary: 153 mg/dL — ABNORMAL HIGH (ref 70–99)
Glucose-Capillary: 177 mg/dL — ABNORMAL HIGH (ref 70–99)
Glucose-Capillary: 153 mg/dL — ABNORMAL HIGH (ref 70–99)
Glucose-Capillary: 190 mg/dL — ABNORMAL HIGH (ref 70–99)

## 2021-02-10 MED ORDER — ISOSORBIDE MONONITRATE ER 30 MG PO TB24
30.0000 mg | ORAL_TABLET | Freq: Every day | ORAL | Status: DC
  Administered 2021-02-10 – 2021-02-11 (×2): 30 mg via ORAL
  Filled 2021-02-10 (×2): qty 1

## 2021-02-10 NOTE — Assessment & Plan Note (Signed)
Volume stable.  Trace pitting edema present.  Patient was on Zaroxolyn and torsemide. Continue torsemide 60 mg daily.  Last echocardiogram shows 55 to 60% EF.

## 2021-02-10 NOTE — Assessment & Plan Note (Addendum)
Renal function stable.  Monitor.  Avoid nephrotoxic medication Continue torsemide but hold zaroxolyn

## 2021-02-10 NOTE — Progress Notes (Signed)
Inpatient Rehab Admissions Coordinator:   Awaiting insurance determination regarding CIR prior auth request.  Will follow.   Shann Medal, PT, DPT Admissions Coordinator [phone] 02/10/21  9:23 AM

## 2021-02-10 NOTE — Assessment & Plan Note (Addendum)
Blood pressure is improving on current regimen. Continue hydralazine, torsemide.Continue Imdur. Norvasc was stopped although I do not think the patient actually has Norvasc induced bradycardia. Cardiology consulted.  Outpatient, there was discussion about consideration of pacemaker.

## 2021-02-10 NOTE — Assessment & Plan Note (Addendum)
Urine somewhat cloudy  need to encourage oral hydration, Monitor for retention.

## 2021-02-10 NOTE — Progress Notes (Signed)
Physical Therapy Treatment Patient Details Name: Kyle Roberson MRN: [id number] DOB: 02-19-1941 Today's Date: 02/10/2021   History of Present Illness Kyle Roberson is an 80 y.o. male with a PMH significant for CAD, diabetes, hypertension, GERD, hyperlipidemia, chronic A. fib.  CT head shows acute subdural bleeding.  Patient s/p left craniotomy for evacuation of subdural hematoma 02/05/21.    PT Comments    Pt resting in bed upon PT arrival with visitor present; pt agreeable to PT session.  SBA semi-supine to sitting edge of bed; min assist with transfers; and CGA to min assist to ambulate 120 feet with RW.  Pt progressing with functional mobility but reporting being concerned of falling.  BP 144/59 at rest beginning of session and 134/52 post ambulation.  Will continue to focus on strengthening, balance, and progressive functional mobility per pt tolerance.    Recommendations for follow up therapy are one component of a multi-disciplinary discharge planning process, led by the attending physician.  Recommendations may be updated based on patient status, additional functional criteria and insurance authorization.  Follow Up Recommendations  Acute inpatient rehab (3hours/day)     Assistance Recommended at Discharge Frequent or constant Supervision/Assistance  Equipment Recommendations  Rolling walker (2 wheels);BSC/3in1    Recommendations for Other Services OT consult     Precautions / Restrictions Precautions Precautions: Fall Precaution Comments: Maintain BP <160; HOB >30 degrees Restrictions Weight Bearing Restrictions: No     Mobility  Bed Mobility Overal bed mobility: Needs Assistance Bed Mobility: Supine to Sit     Supine to sit: Supervision     General bed mobility comments: vc's for technique; increased effort for pt to perform on own    Transfers Overall transfer level: Needs assistance Equipment used: Rolling walker (2 wheels) Transfers: Sit to/from  Stand Sit to Stand: Min assist           General transfer comment: vc's for UE placement; assist for balance with transfers    Ambulation/Gait Ambulation/Gait assistance: Min guard;Min assist Gait Distance (Feet): 120 Feet Assistive device: Rolling walker (2 wheels) Gait Pattern/deviations: Decreased step length - left;Decreased step length - right;Decreased dorsiflexion - right;Decreased dorsiflexion - left;Decreased stride length Gait velocity: decreased     General Gait Details: partial step through gait pattern; vc's to look up when walking   Stairs             Wheelchair Mobility    Modified Rankin (Stroke Patients Only)       Balance Overall balance assessment: Needs assistance Sitting-balance support: No upper extremity supported;Feet supported Sitting balance-Leahy Scale: Good Sitting balance - Comments: steady sitting reaching within BOS   Standing balance support: Bilateral upper extremity supported Standing balance-Leahy Scale: Fair Standing balance comment: requires B UE support for dynamic standing activities                            Cognition Arousal/Alertness: Awake/alert Behavior During Therapy: WFL for tasks assessed/performed Overall Cognitive Status: No family/caregiver present to determine baseline cognitive functioning Area of Impairment: Following commands;Problem solving;Safety/judgement                       Following Commands: Follows multi-step commands inconsistently;Follows multi-step commands with increased time Safety/Judgement: Decreased awareness of deficits;Decreased awareness of safety   Problem Solving: Requires verbal cues;Requires tactile cues General Comments: TED hose donned beginning of session        Exercises   General Comments  Nursing cleared pt for participation in physical therapy.  Pt agreeable to PT session.       Pertinent Vitals/Pain Pain Assessment: Faces Faces Pain Scale:  Hurts little more Facial Expression: Grimacing Pain Location: "old man cramp", didn't specify location Pain Descriptors / Indicators: Aching;Discomfort Pain Intervention(s): Monitored during session Vitals stable and WFL throughout treatment session.    Home Living                          Prior Function            PT Goals (current goals can now be found in the care plan section) Acute Rehab PT Goals Patient Stated Goal: get better so I can go home PT Goal Formulation: With patient/family Time For Goal Achievement: 02/20/21 Potential to Achieve Goals: Good Progress towards PT goals: Progressing toward goals    Frequency    7X/week      PT Plan Current plan remains appropriate    Co-evaluation              AM-PAC PT "6 Clicks" Mobility   Outcome Measure  Help needed turning from your back to your side while in a flat bed without using bedrails?: None Help needed moving from lying on your back to sitting on the side of a flat bed without using bedrails?: A Little Help needed moving to and from a bed to a chair (including a wheelchair)?: A Little Help needed standing up from a chair using your arms (e.g., wheelchair or bedside chair)?: A Little Help needed to walk in hospital room?: A Little Help needed climbing 3-5 steps with a railing? : A Lot 6 Click Score: 18    End of Session Equipment Utilized During Treatment: Gait belt Activity Tolerance: Patient tolerated treatment well Patient left: in chair;with call bell/phone within reach Nurse Communication: Mobility status;Precautions PT Visit Diagnosis: Muscle weakness (generalized) (M62.81);Difficulty in walking, not elsewhere classified (R26.2);Hemiplegia and hemiparesis;Unsteadiness on feet (R26.81) Hemiplegia - Right/Left: Right Hemiplegia - dominant/non-dominant: Dominant Hemiplegia - caused by: Other cerebrovascular disease     Time: 0901-0939 PT Time Calculation (min) (ACUTE ONLY): 38  min  Charges:  $Gait Training: 8-22 mins $Therapeutic Activity: 23-37 mins                     Leitha Bleak, PT 02/10/21, 5:15 PM

## 2021-02-10 NOTE — Assessment & Plan Note (Addendum)
Counseled for weight loss Body mass index is 34.97 kg/m.

## 2021-02-10 NOTE — Assessment & Plan Note (Addendum)
Currently on sliding-scale insulin.  Continue Semglee 10 units daily. A1c 7.3 Home DM Meds:  Glipizide 10 mg BID NPH Insulin 10 units

## 2021-02-10 NOTE — PMR Pre-admission (Signed)
PMR Admission Coordinator Pre-Admission Assessment  Patient: Kyle Roberson is an 80 y.o., male MRN: [id number] DOB: 1940/12/31 Height: _0  (172.7 cm) Weight: 104.3 kg  Insurance Information HMO:     PPO: yes     PCP:      IPA:      80/20:      OTHER:  PRIMARY: [employer] Medicare      Policy#: [id number]      Subscriber: pt CM Name: Peter Congo      Phone#:      Fax#: [phone] Pre-Cert#: [id number] [place] for CIR given via Peter Congo with [place] [employer].  Updates due to [place] at fax listed above on 11/25.  (Phone [phone])      Employer:  Benefits:  Phone #: [phone]     Name:  Eff. Date: 03/23/20     Deduct: $100 (met)      Out of Pocket Max: $1400 (met $440.50)      Life Max: n/a CIR: 80%      SNF: 100% Outpatient: 80%     Co-Ins: 20% Home Health: 100%      Co-Pay:  DME: 80%     Co-Ins: 20% Providers:  SECONDARY: [place]      Policy#: [id number]     Phone#: [phone]  Financial Counselor:       Phone#:   The "Data Collection Information Summary" for patients in Inpatient Rehabilitation Facilities with attached "Privacy Act [place] Records" was provided and verbally reviewed with: Patient and Family  Emergency Contact Information Contact Information     Name Relation Home Work Mobile   Kyle Roberson Significant other   [phone]       Current Medical History  Patient Admitting Diagnosis: SDH s/p crani evacuation  History of Present Illness: Kyle Roberson is an 80 year old right-handed male with history of diabetes mellitus, hypertension, CKD stage IV, hyperlipidemia, CAD with stenting maintained on Eliquis, chronic diastolic congestive heart failure, tobacco abuse, 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  Presented to [facility]/04/2021 with bouts of dizziness and difficulty with hand coordination over a 1 day period as well as expressive aphasia with increasing right side weakness.  Patient does endorse a fall in  August 2022 cranial CT scan at that time showed no acute intracranial process..  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  No other new acute intracranial abnormality.  Admission chemistries unremarkable aside glucose 261 BUN 39 creatinine 2.04, hemoglobin A1c 7.3.  MRI 02/03/2021 showed nonacute subdural hematoma on the left with cortical mass-effect and 5 mm midline shift.  MRA no emergent finding of proximal flow-limiting stenosis.  Follow-up neurosurgery due to patient's progressive changes underwent left craniotomy evacuation of subdural hematoma 02/05/2021 per Dr. Lacinda Axon.  Latest follow-up CT scan showed no evidence of new intracranial hemorrhage.  Patient was cleared to resume chronic Eliquis 02/11/2021.  Tolerating a regular consistency diet.  Therapy evaluations completed due to patient's right side weakness and aphasia was admitted for a comprehensive rehab program.    Patient's medical record from [facility] has been reviewed by the rehabilitation admission coordinator and physician.  Past Medical History  Past Medical History:  Diagnosis Date   AKI (acute kidney injury) ([place]) 07/06/2018   CAD (coronary artery disease)    Cellulitis of  lower extremity 05/31/2019   Diabetes ([place])    GERD (gastroesophageal reflux disease)    HLD (hyperlipidemia)    HTN (hypertension)     Has the patient had major surgery during 100 days prior to admission? Yes  Family History   family history includes CAD in his father; Diabetes in his brother; Pancreatic cancer in his mother.  Current Medications  Current Facility-Administered Medications:    acetaminophen (TYLENOL) tablet 650 mg, 650 mg, Oral, Q4H PRN, 650 mg at 02/08/21 1132 **OR** acetaminophen (TYLENOL)  suppository 650 mg, 650 mg, Rectal, Q4H PRN, Loleta Dicker, PA   apixaban [facility]) tablet 2.5 mg, 2.5 mg, Oral, BID, Loleta Dicker, PA, 2.5 mg at 02/11/21 0160   bisacodyl (DULCOLAX) suppository 10 mg, 10 mg, Rectal, Daily PRN, Loleta Dicker, PA   butalbital-acetaminophen-caffeine (FIORICET) 50-325-40 MG per tablet 1 tablet, 1 tablet, Oral, Q4H PRN, Loleta Dicker, PA   Chlorhexidine Gluconate Cloth 2 % PADS 6 each, 6 each, Topical, Daily, Deetta Perla, MD, 6 each at 02/11/21 [phone]   diphenhydrAMINE (BENADRYL) capsule 25 mg, 25 mg, Oral, Q8H PRN, Sharion Settler, NP, 25 mg at 02/07/21 0620   dorzolamide-timolol (COSOPT) 22.3-6.8 MG/ML ophthalmic solution 1 drop, 1 drop, Left Eye, BID, Loleta Dicker, PA, 1 drop at 02/11/21 2355   hydrALAZINE (APRESOLINE) injection 10 mg, 10 mg, Intravenous, Q8H PRN, Shawna Clamp, MD, 10 mg at 02/09/21 2309   hydrALAZINE (APRESOLINE) tablet 100 mg, 100 mg, Oral, TID, Loleta Dicker, PA, 100 mg at 02/11/21 7322   hydrocortisone cream 1 %, , Topical, TID PRN, [name], MD, Given at 02/08/21 0324   insulin aspart (novoLOG) injection 0-9 Units, 0-9 Units, Subcutaneous, TID WC, Loleta Dicker, PA, 2 Units at 02/11/21 0254   insulin aspart (novoLOG) injection 3 Units, 3 Units, Subcutaneous, TID WC, Max Sane, MD, 3 Units at 02/11/21 0837   insulin glargine-yfgn (SEMGLEE) injection 10 Units, 10 Units, Subcutaneous, Daily, Shawna Clamp, MD, 10 Units at 02/11/21 0837   isosorbide mononitrate (IMDUR) 24 hr tablet 30 mg, 30 mg, Oral, Daily, Lavina Hamman, MD, 30 mg at 02/11/21 0836   naloxone (NARCAN) injection 0.08 mg, 0.08 mg, Intravenous, PRN, Loleta Dicker, PA   ondansetron [facility]) tablet 4 mg, 4 mg, Oral, Q4H PRN **OR** ondansetron (ZOFRAN) injection 4 mg, 4 mg, Intravenous, Q4H PRN, Loleta Dicker, PA   oxyCODONE (Oxy IR/ROXICODONE) immediate release tablet 5 mg, 5 mg, Oral, Q6H PRN, Loleta Dicker, PA, 5 mg  at 02/06/21 0809   polyethylene glycol (MIRALAX / GLYCOLAX) packet 17 g, 17 g, Oral, Daily PRN, Loleta Dicker, PA   promethazine (PHENERGAN) tablet 12.5-25 mg, 12.5-25 mg, Oral, Q4H PRN, Loleta Dicker, PA   senna (SENOKOT) tablet 8.6 mg, 1 tablet, Oral, BID, Loleta Dicker, PA, 8.6 mg at 02/11/21 2706   sodium phosphate (FLEET) 7-19 GM/118ML enema 1 enema, 1 enema, Rectal, Once PRN, Loleta Dicker, PA   torsemide [facility]) tablet 60 mg, 60 mg, Oral, Daily, Loleta Dicker, [place], 60 mg at 02/11/21 2376  Patients Current Diet:  Diet Order             Diet Carb Modified Fluid consistency: Thin; Room service appropriate? Yes  Diet effective now                   Precautions / Restrictions Precautions Precautions: Fall Precaution Comments: Maintain BP <160; HOB >30 degrees Restrictions Weight Bearing Restrictions: No  Has the patient had 2 or more falls or a fall with injury in the past year? Yes  Prior Activity Level Limited Community (1-2x/wk): independent, most recently with a quad cane following most recent fall, not driving  Prior Functional Level Self Care: Did the patient need help bathing, dressing, using the toilet or eating? Independent  Indoor Mobility: Did the patient need assistance with walking from room to room (with or without device)? Independent  Stairs: Did the patient need assistance with internal or external stairs (with or without device)? Independent  Functional Cognition: Did the patient need help planning regular tasks such as shopping or remembering to take medications? Independent  Patient Information Are you of Hispanic, Latino/a,or Spanish origin?: A. No, not of Hispanic, Latino/a, or Spanish origin What is your race?: A. White Do you need or want an interpreter to communicate with a doctor or health care staff?: 0. No  Patient's Response To:  Health Literacy and Transportation Is the patient able to respond to health  literacy and transportation needs?: Yes Health Literacy - How often do you need to have someone help you when you read instructions, pamphlets, or other written material from your doctor or pharmacy?: Never In the past 12 months, has lack of transportation kept you from medical appointments or from getting medications?: No In the past 12 months, has lack of transportation kept you from meetings, work, or from getting things needed for daily living?: No  Home Assistive Devices / Equipment Home Equipment: Cane - quad  Prior Device Use: Indicate devices/aids used by the patient prior to current illness, exacerbation or injury? Walker  Current Functional Level Cognition  Overall Cognitive Status: No family/caregiver present to determine baseline cognitive functioning Orientation Level: Oriented X4 Following Commands: Follows multi-step commands inconsistently, Follows multi-step commands with increased time Safety/Judgement: Decreased awareness of deficits, Decreased awareness of safety General Comments: TED hose donned beginning of session    Extremity Assessment (includes Sensation/Coordination)  Upper Extremity Assessment:  (R UE grossly 4-/5 throughout, moderate dysmetria and coordination deficits; mild inattention, positive pronator drift.  L UE grossly WFL) RUE Deficits / Details: Grip 4/5, Triceps 4/5, Deltoids 3+/5 RUE Coordination: decreased fine motor, decreased gross motor (Needed step by step cuing, unable to perform 5 finger opposition, RAM, finger nose finger)  Lower Extremity Assessment:  (R LE grossly 4-/5, denies sensory deficit; L LE grossly 4+/5)    ADLs  Overall ADL's : Needs assistance/impaired General ADL Comments: Pt static sitting on EOB for theraputty/ vision exercises~ 10 min. MAX A for perihygiene, standing.    Mobility  Overal bed mobility: Needs Assistance Bed Mobility: Supine to Sit Supine to sit: Supervision General bed mobility comments: vc's for  technique; increased effort for pt to perform on own    Transfers  Overall transfer level: Needs assistance Equipment used: Rolling walker (2 wheels) Transfers: Sit to/from Stand Sit to Stand: Min assist Bed to/from chair/wheelchair/BSC transfer type:: Stand pivot Stand pivot transfers: Min assist (with RW, stand pivot bed>recliner) General transfer comment: vc's for UE placement; assist for balance with transfers    Ambulation / Gait / Stairs / Wheelchair Mobility  Ambulation/Gait Ambulation/Gait assistance: Min guard, Min assist Gait Distance (Feet): 120 Feet Assistive device: Rolling walker (2 wheels) Gait Pattern/deviations: Decreased step length - left, Decreased step length - right, Decreased dorsiflexion - right, Decreased dorsiflexion - left, Decreased stride length General Gait Details: partial step through gait pattern; vc's to look up when walking Gait velocity: decreased  Posture / Balance Dynamic Sitting Balance Sitting balance - Comments: steady sitting reaching within BOS Balance Overall balance assessment: Needs assistance Sitting-balance support: No upper extremity supported, Feet supported Sitting balance-Leahy Scale: Good Sitting balance - Comments: steady sitting reaching within BOS Standing balance support: Bilateral upper extremity supported Standing balance-Leahy Scale: Fair Standing balance comment: requires B UE support for dynamic standing activities    Special needs/care consideration Skin scalp incision  and Diabetic management yes   Previous Home Environment (from acute therapy documentation) Living Arrangements: Spouse/significant other Available Help at Discharge: Family, Available PRN/intermittently Type of Home: House Home Layout: One level, Laundry or work area in basement (no essential needs in basement area) Home Access: Ramped entrance [place]: No  Discharge Living Setting Plans for Discharge Living Setting: Patient's home,  Lives with (comment) (significant other ([name])) Type of Home at Discharge: House Discharge Home Layout: One level, Laundry or work area in basement Discharge Home Access: [place] entrance Discharge Bathroom Shower/Tub: Walk-in shower Discharge Bathroom Toilet: Handicapped height Discharge Bathroom Accessibility: Yes How Accessible: Accessible via walker Does the patient have any problems obtaining your medications?: No  Social/Family/Support Systems Patient Roles: Partner Anticipated Caregiver: Nancy Marus Anticipated Caregiver's Contact Information: [phone] Ability/Limitations of Caregiver: will have other caregivers to provide supervision (if needed) if she needs to be out of the house Caregiver Availability: 24/7 Discharge Plan Discussed with Primary Caregiver: Yes Is Caregiver In Agreement with Plan?: Yes Does Caregiver/Family have Issues with Lodging/Transportation while Pt is in Rehab?: No  Goals Patient/Family Goal for Rehab: PT/OT/SLP supervision Expected length of stay: 12-14 days Pt/Family Agrees to Admission and willing to participate: Yes Program Orientation Provided & Reviewed with Pt/Caregiver Including Roles  & Responsibilities: Yes  Barriers to Discharge: Insurance for SNF coverage  Decrease burden of Care through IP rehab admission: n/a  Possible need for SNF placement upon discharge: NO  Patient Condition: I have reviewed medical records from [facility], spoken with CM, and patient and spouse. I discussed via phone for inpatient rehabilitation assessment.  Patient will benefit from ongoing PT, OT, and SLP, can actively participate in 3 hours of therapy a day 5 days of the week, and can make measurable gains during the admission.  Patient will also benefit from the coordinated team approach during an Inpatient Acute Rehabilitation admission.  The patient will receive intensive therapy as well as Rehabilitation physician, nursing, social worker, and care management  interventions.  Due to bladder management, bowel management, safety, skin/wound care, disease management, medication administration, pain management, and patient education the patient requires 24 hour a day rehabilitation nursing.  The patient is currently min assist with mobility and mod/max assist basic ADLs.  Discharge setting and therapy post discharge at home with home health is anticipated.  Patient has agreed to participate in the Acute Inpatient Rehabilitation Program and will admit today.  Preadmission Screen Completed By:  Michel Santee, PT, DPT 02/11/2021 10:31 AM ______________________________________________________________________   Discussed status with Dr. Dagoberto Ligas on 02/11/21  at 10:31 AM  and received approval for admission today.  Admission Coordinator:  Michel Santee, PT, DPT time 10:31 AM Sudie Grumbling 02/11/21    Assessment/Plan: Diagnosis: Does the need for close, 24 hr/day Medical supervision in concert with the patient's rehab needs make it unreasonable for this patient to be served in a less intensive setting? Yes Co-Morbidities requiring supervision/potential complications: SDH, cognitive issues due to SDH, s/p crani for evacuation- Afib- dCHF, CKD4, HTN Due to bladder management, bowel management, safety,  skin/wound care, disease management, medication administration, pain management, and patient education, does the patient require 24 hr/day rehab nursing? Yes Does the patient require coordinated care of a physician, rehab nurse, PT, OT, and SLP to address physical and functional deficits in the context of the above medical diagnosis(es)? Yes Addressing deficits in the following areas: balance, endurance, locomotion, strength, transferring, bowel/bladder control, bathing, dressing, feeding, grooming, toileting, and cognition Can the patient actively participate in an intensive therapy program of at least 3 hrs of therapy 5 days a week? Yes The potential for patient to make  measurable gains while on inpatient rehab is good Anticipated functional outcomes upon discharge from inpatient rehab: supervision PT, supervision OT, supervision SLP Estimated rehab length of stay to reach the above functional goals is: 12-14 days Anticipated discharge destination: Home 10. Overall Rehab/Functional Prognosis: good   MD Signature:

## 2021-02-10 NOTE — TOC Progression Note (Signed)
Transition of Care [facility]) - Progression Note    Patient Details  Name: Wilver Tignor MRN: [id number] Date of Birth: Jan 28, 1941  Transition of Care [facility]) CM/SW Contact  Kerin Salen, RN Phone Number: 02/10/2021, 2:03 PM  Clinical Narrative:  Eston Esters feels that CIR Authorization may be available tomorrow will keep TOC updated.          Expected Discharge Plan and Services                                                 Social Determinants of Health (SDOH) Interventions    Readmission Risk Interventions Readmission Risk Prevention Plan 07/09/2018  Transportation Screening Complete  PCP or Specialist Appt within 5-7 Days Complete  Home Care Screening Complete  Medication Review (RN CM) Complete

## 2021-02-10 NOTE — Progress Notes (Signed)
Inpatient Rehab Admissions Coordinator:   I have received insurance auth for CIR, however I do not have a bed today.  Will update pt/family and follow for potential admission pending bed availability.   Shann Medal, PT, DPT Admissions Coordinator [phone] 02/10/21  3:35 PM

## 2021-02-10 NOTE — Assessment & Plan Note (Addendum)
SP left craniotomy for evacuation of subdural hematoma.  Drain removed.  Management per neurosurgery.  CM placement at discharge.  Repeat CT scan shows no acute bleeding.  Still has midline shift as well as  Mass-effect.  Started on apixaban by neurosurgery

## 2021-02-10 NOTE — Progress Notes (Signed)
Occupational Therapy Treatment Patient Details Name: Kyle Roberson MRN: [id number] DOB: [birth year] Today's Date: 02/10/2021   History of present illness Kyle Roberson is an 80 y.o. male with a PMH significant for CAD, diabetes, hypertension, GERD, hyperlipidemia, chronic A. fib.  CT head shows acute subdural bleeding.  Patient s/p left craniotomy for evacuation of subdural hematoma 02/05/21.   OT comments  Kyle Roberson seen for OT treatment on this date. Upon arrival to room pt sleepy and awoke to participate in therapy, improved participation with lights on and OOB. Pt dynamic sitting on EOB for theraputty/vision exercises~ 15 min. MAX A for perihygiene, standing. Pt unable to complete maze correctly (connects start and finish while ignoring maze) unable to correct with cues. Pt unable to complete Gwenlyn Perking' s test. Unclear whether limited 2/2 cognition deficits, baseline/new stroke vision deficits, or motivation. Pt signed signature with less than 25% legibility using dominant RUE.   Pt participated in therex (see exercises below), handout and red putty provided. Pt making good progress toward goals. Pt continues to benefit from skilled OT services to maximize return to PLOF and minimize risk of future falls, injury, caregiver burden, and readmission. Will continue to follow POC. Discharge recommendation remains appropriate.     Recommendations for follow up therapy are one component of a multi-disciplinary discharge planning process, led by the attending physician.  Recommendations may be updated based on patient status, additional functional criteria and insurance authorization.    Follow Up Recommendations  Acute inpatient rehab (3hours/day)    Assistance Recommended at Discharge Frequent or constant Supervision/Assistance  Equipment Recommendations  BSC/3in1    Recommendations for Other Services      Precautions / Restrictions Precautions Precautions: Fall Restrictions Weight  Bearing Restrictions: No       Mobility Bed Mobility Overal bed mobility: Needs Assistance Bed Mobility: Supine to Sit     Supine to sit: Supervision          Transfers Overall transfer level: Needs assistance Equipment used: 1 person hand held assist   Sit to Stand: Min assist           General transfer comment: furniture walking, sits prematurely     Balance Overall balance assessment: Needs assistance Sitting-balance support: No upper extremity supported;Feet supported Sitting balance-Leahy Scale: Good     Standing balance support: No upper extremity supported;During functional activity Standing balance-Leahy Scale: Fair                             ADL either performed or assessed with clinical judgement   ADL Overall ADL's : Needs assistance/impaired                                       General ADL Comments: Pt static sitting on EOB for theraputty/ vision exercises~ 10 min. MAX A for perihygiene, standing.    Extremity/Trunk Assessment              Vision       Perception     Praxis      Cognition Arousal/Alertness: Awake/alert Behavior During Therapy: WFL for tasks assessed/performed Overall Cognitive Status: No family/caregiver present to determine baseline cognitive functioning Area of Impairment: Safety/judgement;Problem solving;Following commands                       Following Commands: Follows one step commands  with increased time;Follows one step commands inconsistently Safety/Judgement: Decreased awareness of deficits;Decreased awareness of safety   Problem Solving: Difficulty sequencing;Requires verbal cues General Comments: Pt unable to complete maze correctly (connects start and finish while ignoring maze) unable to correct with cues. Unclear whether limited 2/2 cognition deficits, baseline/new stroke vision deficits, or motivation.          Exercises Exercises: Other exercises Other  Exercises Other Exercises: Pt educ re: Ther-ex, d/c recs, falls prevention, theraputty use, Other Exercises: Sup>sit, theraputty, vision activities - maze, bisecting lines, writing name, bed to chair, changed bed linens, ther-ex shoulder press, bicep curls, shoulder flexion/extension   Shoulder Instructions       General Comments      Pertinent Vitals/ Pain       Pain Assessment: Faces Faces Pain Scale: Hurts little more Facial Expression: Grimacing Pain Location: "old man cramp", didn't specify location Pain Descriptors / Indicators: Aching;Discomfort Pain Intervention(s): Monitored during session  Home Living                                          Prior Functioning/Environment              Frequency  Min 5X/week        Progress Toward Goals  OT Goals(current goals can now be found in the care plan section)  Progress towards OT goals: Progressing toward goals  Acute Rehab OT Goals Patient Stated Goal: to go home OT Goal Formulation: With patient Time For Goal Achievement: 02/20/21 Potential to Achieve Goals: Good ADL Goals Pt Will Perform Grooming: with min assist;standing Pt Will Transfer to Toilet: with modified independence;ambulating;regular height toilet Pt/caregiver will Perform Home Exercise Program: Increased ROM;Increased strength;With theraputty;Both right and left upper extremity;Independently  Plan Discharge plan remains appropriate;Frequency remains appropriate    Co-evaluation                 AM-PAC OT "6 Clicks" Daily Activity     Outcome Measure   Help from another person eating meals?: A Little Help from another person taking care of personal grooming?: A Little Help from another person toileting, which includes using toliet, bedpan, or urinal?: A Lot Help from another person bathing (including washing, rinsing, drying)?: A Lot Help from another person to put on and taking off regular upper body clothing?: A  Little Help from another person to put on and taking off regular lower body clothing?: A Lot 6 Click Score: 15    End of Session    OT Visit Diagnosis: Unsteadiness on feet (R26.81);Other abnormalities of gait and mobility (R26.89);Muscle weakness (generalized) (M62.81)   Activity Tolerance Patient tolerated treatment well   Patient Left in chair;with call bell/phone within reach   Nurse Communication Other (comment) (catherter lose)        Time: 1441-1510 OT Time Calculation (min): 29 min  Charges: OT General Charges $OT Visit: 1 Visit OT Treatments $Self Care/Home Management : 8-22 mins $Therapeutic Exercise: 8-22 mins  Nino Glow, OTS   Nino Glow 02/10/2021, 3:46 PM

## 2021-02-10 NOTE — Assessment & Plan Note (Signed)
Chronic vision loss.

## 2021-02-10 NOTE — Progress Notes (Signed)
   Progress Note  History: Glendell Fouse is s/p left craniotomy for evacuation of subdural hematoma  POD#6: Mild headache over the weekend without changes in neurologic exam. Continues to feel improvement in his dysarthria.  POD#5: Doing well neurologically.  Had some bradycardia overnight  POD#4: Doing well - working with PTOT  POD#3: drain removed yesterday. NAEO  POD#2: Some incisional pain overnight.  POD#1: NAEO. Pt reports good pain control.   Physical Exam: Vitals:   02/10/21 0800 02/10/21 0821  BP: (!) 179/87 133/63  Pulse: 68 (!) 57  Resp: 19 (!) 27  Temp:    SpO2: 92% 94%    AA Ox3; mild dysarthria  CNI except left eye blindness (baseline) Strength:5/5 throughout  No pronator drift Incision c/d/I with staples in place  Data:  Recent Labs  Lab 02/05/21 0422 02/10/21 0443  NA 140 137  K 4.0 3.6  CL 107 100  CO2 25 30  BUN 36* 47*  CREATININE 1.90* 1.91*  GLUCOSE 246* 153*  CALCIUM 8.4* 8.5*    No results for input(s): AST, ALT, ALKPHOS in the last 168 hours.  Invalid input(s): TBILI   Recent Labs  Lab 02/05/21 0422 02/10/21 0443  WBC 6.5 7.0  HGB 12.6* 12.1*  HCT 38.4* 37.4*  PLT 219 234    No results for input(s): APTT, INR in the last 168 hours.       Other tests/results:  CT head 02/05/21 IMPRESSION: 1. Interval left-sided craniotomy with surgical drain in the scalp and underlying reduction in the left cerebral convexity mixed-density subdural hematoma. 2. Scattered air pockets in the collection with decreased left-to-right midline shift now 3 mm. 3. There is decreased mass effect on the left cerebral hemisphere with decreased gyral crowding. There is no downward mass effect. 4. Atrophy and small vessel changes.   Electronically Signed   By: Telford Nab M.D.   On: 02/05/2021 06:03  Assessment/Plan:  Demetrice Combes is a 80 y.o presenting with chronic SDH with midline shift s/p left craniotomy for evacuation.   -  mobilize - pain control - started SSI - DVT prophylaxis with SQH - PTOT; no activity restrictions  - drain removed 02/06/21 - hospitalist consulted for medical management - appreciate help - cardiology consulted- appreciate input. - on floor status but remains in ICU due to bed availability  - Will obtain head CT today prior to restarting Eliquis (soonest would be POD#8) - dispo planning underway  Cooper Render PA-C Department of Neurosurgery

## 2021-02-10 NOTE — Assessment & Plan Note (Addendum)
Remains in A. fib but rate controlled. Actually has bradycardia apixaban resumed on 02/11/2021

## 2021-02-10 NOTE — Progress Notes (Addendum)
Progress Note  Patient Name: Kyle Roberson Date of Encounter: 02/10/2021  [facility] Cardiologist: [name]  Subjective   Patient is A&Ox2. No chest pain or SOB. Wife at bedside. HR 50-60s on telemetry, no symptoms by patient report.   Inpatient Medications    Scheduled Meds:  Chlorhexidine Gluconate Cloth  6 each Topical Daily   dorzolamide-timolol  1 drop Left Eye BID   heparin injection (subcutaneous)  5,000 Units Subcutaneous Q12H   hydrALAZINE  100 mg Oral TID   insulin aspart  0-9 Units Subcutaneous TID WC   insulin aspart  3 Units Subcutaneous TID WC   insulin glargine-yfgn  10 Units Subcutaneous Daily   isosorbide mononitrate  30 mg Oral Daily   levETIRAcetam  500 mg Oral BID   senna  1 tablet Oral BID   torsemide  60 mg Oral Daily   Continuous Infusions:  PRN Meds: acetaminophen **OR** acetaminophen, bisacodyl, butalbital-acetaminophen-caffeine, diphenhydrAMINE, hydrALAZINE, hydrocortisone cream, naLOXone (NARCAN)  injection, ondansetron **OR** ondansetron (ZOFRAN) IV, oxyCODONE, polyethylene glycol, promethazine, sodium phosphate   Vital Signs    Vitals:   02/10/21 0200 02/10/21 0400 02/10/21 0602 02/10/21 0732  BP: (!) 171/60 (!) 141/60 (!) 145/62   Pulse: (!) 58 (!) 50 (!) 59   Resp: 18 16 (!) 27   Temp:   97.9 F (36.6 C) 97.9 F (36.6 C)  TempSrc:   Oral Oral  SpO2: 99% 99% 94%   Weight:      Height:        Intake/Output Summary (Last 24 hours) at 02/10/2021 0740 Last data filed at 02/10/2021 0600 Gross per 24 hour  Intake 240 ml  Output 1725 ml  Net -1485 ml   Last 3 Weights 02/04/2021 02/02/2021 01/28/2021  Weight (lbs) 230 lb 230 lb 230 lb  Weight (kg) 104.327 kg 104.327 kg 104.327 kg      Telemetry    Afib HR 50-60s, PVCs - Personally Reviewed  ECG    No new - Personally Reviewed  Physical Exam   GEN: No acute distress.   Neck: No JVD Cardiac: RRR, no murmurs, rubs, or gallops.  Respiratory: Clear to auscultation  bilaterally. GI: Soft, nontender, non-distended  MS: minimal edema; No deformity. Neuro:  Nonfocal  Psych: Normal affect   Labs    High Sensitivity Troponin:  No results for input(s): TROPONINIHS in the last 720 hours.   Chemistry Recent Labs  Lab 02/05/21 0422 02/10/21 0443  NA 140 137  K 4.0 3.6  CL 107 100  CO2 25 30  GLUCOSE 246* 153*  BUN 36* 47*  CREATININE 1.90* 1.91*  CALCIUM 8.4* 8.5*  GFRNONAA 35* 35*  ANIONGAP 8 7    Lipids No results for input(s): CHOL, TRIG, HDL, LABVLDL, LDLCALC, CHOLHDL in the last 168 hours.  Hematology Recent Labs  Lab 02/05/21 0422 02/10/21 0443  WBC 6.5 7.0  RBC 4.13* 4.06*  HGB 12.6* 12.1*  HCT 38.4* 37.4*  MCV 93.0 92.1  MCH 30.5 29.8  MCHC 32.8 32.4  RDW 13.4 13.2  PLT 219 234   Thyroid No results for input(s): TSH, FREET4 in the last 168 hours.  BNPNo results for input(s): BNP, PROBNP in the last 168 hours.  DDimer No results for input(s): DDIMER in the last 168 hours.   Radiology    No results found.  Cardiac Studies   Echocardiogram February 2021  1. Left ventricular ejection fraction, by estimation, is 55 to 60%. The  left ventricle has normal function. The  left ventricle has no regional  wall motion abnormalities. There is mild left ventricular hypertrophy.  Left ventricular diastolic parameters  are indeterminate.   2. Right ventricular systolic function is normal. The right ventricular  size is normal. There is moderately elevated pulmonary artery systolic  pressure. The estimated right ventricular systolic pressure is 89.1 mmHg.   3. Left atrial size was moderately dilated.   4. The mitral valve is normal in structure and function. Mild to moderate  mitral valve regurgitation. No evidence of mitral stenosis.   5. The aortic valve is normal in structure and function. Aortic valve  regurgitation is mild. Mild aortic valve sclerosis is present, with no  evidence of aortic valve stenosis.   6. Moderately  dilated pulmonary artery.   7. The inferior vena cava is dilated in size with <50% respiratory  variability, suggesting right atrial pressure of 15 mmHg.   Patient Profile     80 y.o. male with h/o CAD, permanent Afib, HTN, HLD, DM2, CKD, GERD, hyperparathyroidism, h/o falls, chronic lower extremity edema, bradycardia as outpatient with rates into the 40s, recovering from craniotomy for left subdural hematoma evacuation following a fall who is being seen for bradycardia.   Assessment & Plan   Bradycardia Permanent Afib  - patient has a history of bradycardia, discussed PPM in the past. Patient declined heart monitor and EP referral.  - PTA not on CCB or BB - heart rates into the upper 40s and 50s, however he is asymptomatic - PTA Eliquis 2.5mg  BID was stopped on admission for SDH - HR on tele 50-60s.  - Given SDH not candidate for a/c with Eliquis, will discuss with MD   SDH - s/p left craniotomy for evacuation of SDH - recovering well - per neurosurgery  Chronic diastolic CHF - Echo 08/9448 showed LVEF 55-60%, no WMA, mild LVH, moderately elevated pulmonary artery systolic pressure, moderately dilated LA, mild to mod MR, mild AI - PTA torsemide 60mg  daily and metolazone PRN - minimal LLE on exam, h/o lymphedema - No BB given bradycardia - No ACEi/ARB with CKD  HTN - bP mildly elevated - PTA Hydralazine 100mg  TID continue - started on Imdur 30mg  daily - BP elevated>>can increase Imdur 60mg  daily  CKD stage 3 - Scr 1.91/BUN 47, appears to be at baseline  DM2 - Hgb A1C 7.3 - per IM   For questions or updates, please contact [facility] Please consult [email or web address] for contact info under        Signed, Jaiven Graveline Ninfa Meeker, PA-C  02/10/2021, 7:40 AM

## 2021-02-10 NOTE — Discharge Summary (Signed)
Physician Discharge Summary  Patient ID: Kyle Roberson MRN: [id number] DOB/AGE: 82-Dec-1942 6 y.o.  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  He underwent a left craniotomy for evacuation of subdural hematoma on 02/04/2021.  His postoperative course was complicated by persistent hyperglycemia, hypertension, and bradycardia.  Internal medicine and cardiology were consulted for further evaluation and medical management.  Postoperatively a subgaleal drain was placed.  The output was monitored and this drain was removed when it came down to acceptable level.  He was seen and evaluated by physical therapy and Occupational Therapy and deemed appropriate for discharge to inpatient rehab. A repeat head CT was done on 02/10/2021 which showed improvement of his overall subdural burden and midline shift.  This is done in anticipation for restarting his Eliquis after postop day 7.  Throughout his hospital stay the patient reported improvement of his preoperative headaches, and dysarthria.  Consults: cardiology and internal medicine Below are the recommendations from the services while  patient was admitted.  Cardiology Permanent Afib  - patient has a history of bradycardia, discussed PPM in the past. Patient declined heart monitor and EP referral.  - PTA not on CCB or BB - heart rates into the upper 40s and 50s, however he is asymptomatic. No indicated for permanent pacemaker at this time. - PTA Eliquis 2.5mg  BID was stopped on admission for SDH - HR on tele 50-60s.   Chronic diastolic CHF - Echo 08/2561 showed LVEF 55-60%, no WMA, mild LVH, moderately elevated pulmonary artery systolic pressure, moderately dilated LA, mild to mod MR, mild AI - PTA torsemide 60mg  daily and metolazone PRN - minimal LLE on exam, h/o lymphedema - No BB given bradycardia - No ACEi/ARB with CKD   HTN - bP mildly elevated - PTA Hydralazine 100mg  TID continue - started on Imdur 30mg  daily - BP elevated>>can increase Imdur 60mg  daily   CKD stage 3 - Scr 1.91/BUN 47, appears to be at baseline   DM2 - Hgb A1C 7.3 - per IM  Internal Medicine Chronic a-fib (HCC) Remains in A. fib but rate controlled. Actually has bradycardia for stopping Norvasc.  Wife is requested [name] to stop by so I have passed on message to him.  She mentioned about the discussion for pacemaker at the last office visit. Not a candidate for anticoagulation due to SDH. Was on Eliquis before admission which is stopped.   Chronic heart failure with preserved ejection fraction (HFpEF) (HCC) Volume stable.  Trace pitting edema present.  Patient was on Zaroxolyn and torsemide. Continue torsemide 60 mg daily.  Last echocardiogram shows 55 to 60% EF.   CKD (chronic kidney disease) stage 4, GFR 15-29 ml/min (HCC) Renal function stable.  Monitor.  Avoid nephrotoxic medication   Obesity (BMI 30-39.9) Counseled for weight loss   Benign non-nodular prostatic hyperplasia with lower urinary tract symptoms Monitor for retention.   Type 2 diabetes mellitus with stage 4 chronic kidney disease (HCC) uncontrolled with  hyperglycemia, on long-term insulin  Currently on sliding-scale insulin. Continue Semglee 10 units daily.  Sugar trending up so adding NovoLog 3 units subcu 3 times daily with meals per diabetic nurse recommendation.  A1c 7.3 Home DM Meds:  Glipizide 10 mg BID NPH Insulin 10 units CBG 200-250 and 15 units CBG >250   Essential hypertension Blood pressure is improving on current regimen. Continue hydralazine, torsemide, as needed hydralazine and labetalol. Adjust as need.  Stopping Norvasc as he was bradycardic overnight.  Wife requesting [name] to stop by as patient follows with him in the office and there was discussion about consideration of pacemaker.  I have conveyed the message to [name] who will see him  Significant Diagnostic Studies: CT head 02/10/21 IMPRESSION: 1. Residual mixed density left-sided subdural hematoma with minimal interval changes as above. Unchanged mass effect and trace rightward midline shift. 2. No evidence of new intracranial hemorrhage. 3. Mild chronic small vessel ischemic disease. CT head 02/05/21 IMPRESSION: 1. Interval left-sided craniotomy with surgical drain in the scalp and underlying reduction in the left cerebral convexity mixed-density subdural hematoma. 2. Scattered air pockets in the collection with decreased left-to-right midline shift now 3 mm. 3. There is decreased mass effect on the left cerebral hemisphere with decreased gyral crowding. There is no downward mass effect. 4. Atrophy and small vessel changes.  Treatments: surgery: as above. See separately dictated operative report for further details.   Discharge Exam: Blood pressure (!) 162/58, pulse (!) 42, temperature (!) 97.5 F (36.4 C), temperature source Oral, resp. rate (!) 23, height 5\' 8"  (1.727 m), weight 104.3 kg, SpO2 93 %. AA Ox3; mild dysarthria- improving CNI except left eye blindness (baseline) Strength:5/5 throughout  No pronator drift Incision c/d/I with  staples in place  Disposition: Discharge disposition: 02-Transferred to [facility]      Discharge Instructions     Diet - low sodium heart healthy   Complete by: As directed    Discharge instructions   Complete by: As directed    Please remove staples on 02/17/21 if pt still in rehab   Discharge wound care:   Complete by: As directed    Ok to leave open to air or cover with clean dressing if changed daily. Please remove staples on 02/17/21 if pt still in rehab      Allergies as of 02/11/2021       Reactions   Atorvastatin Hives, Itching   Simvastatin Hives, Itching        Medication List     STOP taking these medications    levETIRAcetam 500 MG tablet Commonly known as: Keppra       TAKE these medications    dorzolamide-timolol 22.3-6.8 MG/ML ophthalmic solution Commonly known as: COSOPT Place 1 drop into the left eye 2 (two) times daily.   Eliquis 2.5 MG Tabs tablet Generic drug: apixaban TAKE 1 TABLET(2.5 MG) BY MOUTH TWICE DAILY   FISH OIL PO Take 1 capsule by mouth in the morning.   FLAXSEED OIL PO Take 1 capsule by mouth in the morning.   glipiZIDE 10 MG 24 hr tablet Commonly known as: GLUCOTROL XL TAKE 1 TABLET(10 MG) BY MOUTH TWICE DAILY   hydrALAZINE 100 MG tablet Commonly known as:  APRESOLINE Take 1 tablet (100 mg total) by mouth 3 (three) times daily.   insulin NPH Human 100 UNIT/ML injection Commonly known as: NOVOLIN N Inject 10-15 Units into the skin daily. Take 10-15 units twice a day after breakfast and before bedtime per Sliding Scale (BS>250: 15 units, BS= 200-250:10 units)   isosorbide mononitrate 30 MG 24 hr tablet Commonly known as: IMDUR Take 1 tablet (30 mg total) by mouth daily. Start taking on: February 12, 2021   metolazone 2.5 MG tablet Commonly known as: ZAROXOLYN TAKE 1 TABLET BY MOUTH EVERY DAY AS NEEDED FOR RUEAV>409   multivitamin with minerals Tabs tablet Take 1 tablet by mouth in the morning.    nitroGLYCERIN 0.4 MG/SPRAY spray Commonly known as: NITROLINGUAL Place 1 spray under the tongue as directed.   polyethylene glycol 17 g packet Commonly known as: MIRALAX / GLYCOLAX Take 17 g by mouth daily as needed for mild constipation.   potassium chloride SA 20 MEQ tablet Commonly known as: KLOR-CON Take 1 tablet (20 mEq total) by mouth daily as needed. What changed: reasons to take this   QC TUMERIC COMPLEX PO Take 1 capsule by mouth in the morning. Qunol Turmeric Curcumin   QUNOL ULTRA COQ10 PO Take 1 capsule by mouth in the morning.   rosuvastatin 5 MG tablet Commonly known as: CRESTOR TAKE 1 TABLET BY MOUTH 3 TIMES A WEEK   torsemide 20 MG tablet Commonly known as: DEMADEX Take 3 tablets (60 mg total) by mouth daily.   VITAMIN C PO Take 1 tablet by mouth in the morning.   VITAMIN D3 PO Take 1 tablet by mouth in the morning.               Discharge Care Instructions  (From admission, onward)           Start     Ordered   02/11/21 0000  Discharge wound care:       Comments: Ok to leave open to air or cover with clean dressing if changed daily. Please remove staples on 02/17/21 if pt still in rehab   02/11/21 1042             Signed: Loleta Dicker 02/11/2021, 10:42 AM

## 2021-02-10 NOTE — Progress Notes (Signed)
[facility] Progress Note  Patient: Kyle Roberson    KGM:[id number]  DOA: 02/04/2021    Date of Service: the patient was seen and examined on 02/10/2021  Brief hospital course: 11/15 presented to the hospital.  Craniotomy performed. 11/17 hospitalist consult. 11/19: Waiting for floor bed here and CIR placement for disposition 11/20: Blood sugar trending minimally up.  Added NovoLog 3 units 3 times daily with meals.  Heart rate running slow Norvasc stopped.    Assessment and Plan: * Subdural hematoma SP left craniotomy for evacuation of subdural hematoma.  Drain removed.  Management per neurosurgery.  CM placement at discharge.  Repeat CT scan shows no acute bleeding.  Still has midline shift as well as  Mass-effect  Chronic heart failure with preserved ejection fraction (HFpEF) (HCC) Volume stable.  Trace pitting edema present.  Patient was on Zaroxolyn and torsemide. Continue torsemide 60 mg daily.  Last echocardiogram shows 55 to 60% EF.   Essential hypertension Blood pressure is improving on current regimen. Continue hydralazine, torsemide, as needed hydralazine and labetalol. Adjust as need. Norvasc was stopped although I do not think the patient actually has Norvasc induced bradycardia. Cardiology consulted.  Outpatient, there was discussion about consideration of pacemaker. Add Imdur.  Type 2 diabetes mellitus with stage 4 chronic kidney disease (HCC) uncontrolled with hyperglycemia, on long-term insulin  Currently on sliding-scale insulin. Continue Semglee 10 units daily.  Sugar trending up so adding NovoLog 3 units subcu 3 times daily with meals per diabetic nurse recommendation.  A1c 7.3 Home DM Meds:  Glipizide 10 mg BID NPH Insulin 10 units CBG 200-250 and 15 units CBG >250  Chronic a-fib (HCC) Remains in A. fib but rate controlled. Actually has bradycardia for stopping Norvasc.  Not a candidate for anticoagulation due to SDH.  Earliest patient can start  anticoagulation is postop day 8. Was on Eliquis before admission which is stopped.  CKD (chronic kidney disease) stage 4, GFR 15-29 ml/min (HCC) Renal function stable.  Monitor.  Avoid nephrotoxic medication  Obesity (BMI 30-39.9) Counseled for weight loss Body mass index is 34.97 kg/m.   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    Benign non-nodular prostatic hyperplasia with lower urinary tract symptoms Monitor for retention.    Body mass index is 34.97 kg/m.        Subjective: No acute complaint.  No headache.  No nausea no vomiting.  No focal deficit.  Objective: Blood pressure mildly elevated.  Heart rate remained low. General: Appear in mild distress, no Rash; Oral Mucosa Clear, moist. no Abnormal Neck Mass Or lumps, Conjunctiva normal  Cardiovascular: S1 and S2 Present, no Murmur, Respiratory: good respiratory effort, Bilateral Air entry present and CTA, no Crackles, no wheezes Abdomen: Bowel Sound present, Soft and no tenderness Extremities: no Pedal edema Neurology: alert and oriented to time, place, and person affect appropriate. no new focal deficit, chronic left-sided vision loss. Gait not checked due to patient safety concerns   Data Reviewed: CT scan shows evidence of mass-effect with midline shift without any new bleeding.  Disposition:  Status is: Inpatient  Remains inpatient appropriate because: Unsafe discharge. Needing CIR.   Family Communication: no family at bedside.  DVT Prophylaxis: heparin injection 5,000 Units Start: 02/06/21 1000 SCDs Start: 02/04/21 1843   Time spent: 35 minutes.   Author: Berle Mull  02/10/2021 7:45 PM  To reach On-call, see care teams to locate the attending and reach out via [email or web address]. Between 7PM-7AM, please contact night-coverage  If you still have difficulty reaching the attending provider, please page the [facility] (Director on Call) for [facility] on amion for assistance.

## 2021-02-11 ENCOUNTER — Other Ambulatory Visit: Payer: Self-pay

## 2021-02-11 ENCOUNTER — Inpatient Hospital Stay (HOSPITAL_COMMUNITY)
Admission: RE | Admit: 2021-02-11 | Discharge: 2021-02-27 | DRG: 945 | Disposition: A | Discharge status: Home / Self Care | Payer: Medicare HMO | Source: Other Acute Inpatient Hospital | Attending: Physical Medicine & Rehabilitation | Admitting: Physical Medicine & Rehabilitation

## 2021-02-11 ENCOUNTER — Encounter (HOSPITAL_COMMUNITY): Payer: Self-pay | Admitting: Physical Medicine & Rehabilitation

## 2021-02-11 DIAGNOSIS — Z955 Presence of coronary angioplasty implant and graft: Secondary | ICD-10-CM | POA: Diagnosis not present

## 2021-02-11 DIAGNOSIS — S065X0A Traumatic subdural hemorrhage without loss of consciousness, initial encounter: Secondary | ICD-10-CM

## 2021-02-11 DIAGNOSIS — K5900 Constipation, unspecified: Secondary | ICD-10-CM | POA: Diagnosis present

## 2021-02-11 DIAGNOSIS — I69051 Hemiplegia and hemiparesis following nontraumatic subarachnoid hemorrhage affecting right dominant side: Secondary | ICD-10-CM

## 2021-02-11 DIAGNOSIS — Z6834 Body mass index (BMI) 34.0-34.9, adult: Secondary | ICD-10-CM

## 2021-02-11 DIAGNOSIS — E669 Obesity, unspecified: Secondary | ICD-10-CM | POA: Diagnosis not present

## 2021-02-11 DIAGNOSIS — I10 Essential (primary) hypertension: Secondary | ICD-10-CM | POA: Diagnosis not present

## 2021-02-11 DIAGNOSIS — I482 Chronic atrial fibrillation, unspecified: Secondary | ICD-10-CM | POA: Diagnosis present

## 2021-02-11 DIAGNOSIS — R001 Bradycardia, unspecified: Secondary | ICD-10-CM | POA: Diagnosis not present

## 2021-02-11 DIAGNOSIS — I69020 Aphasia following nontraumatic subarachnoid hemorrhage: Secondary | ICD-10-CM

## 2021-02-11 DIAGNOSIS — E785 Hyperlipidemia, unspecified: Secondary | ICD-10-CM | POA: Diagnosis present

## 2021-02-11 DIAGNOSIS — S065X5S Traumatic subdural hemorrhage with loss of consciousness greater than 24 hours with return to pre-existing conscious level, sequela: Secondary | ICD-10-CM | POA: Diagnosis not present

## 2021-02-11 DIAGNOSIS — W1830XD Fall on same level, unspecified, subsequent encounter: Secondary | ICD-10-CM | POA: Diagnosis not present

## 2021-02-11 DIAGNOSIS — G4733 Obstructive sleep apnea (adult) (pediatric): Secondary | ICD-10-CM | POA: Diagnosis present

## 2021-02-11 DIAGNOSIS — E1122 Type 2 diabetes mellitus with diabetic chronic kidney disease: Secondary | ICD-10-CM | POA: Diagnosis present

## 2021-02-11 DIAGNOSIS — H348132 Central retinal vein occlusion, bilateral, stable: Secondary | ICD-10-CM | POA: Diagnosis present

## 2021-02-11 DIAGNOSIS — S065XAA Traumatic subdural hemorrhage with loss of consciousness status unknown, initial encounter: Secondary | ICD-10-CM | POA: Diagnosis not present

## 2021-02-11 DIAGNOSIS — R531 Weakness: Secondary | ICD-10-CM | POA: Diagnosis present

## 2021-02-11 DIAGNOSIS — Z833 Family history of diabetes mellitus: Secondary | ICD-10-CM | POA: Diagnosis not present

## 2021-02-11 DIAGNOSIS — I5032 Chronic diastolic (congestive) heart failure: Secondary | ICD-10-CM | POA: Diagnosis present

## 2021-02-11 DIAGNOSIS — N184 Chronic kidney disease, stage 4 (severe): Secondary | ICD-10-CM | POA: Diagnosis present

## 2021-02-11 DIAGNOSIS — E1142 Type 2 diabetes mellitus with diabetic polyneuropathy: Secondary | ICD-10-CM

## 2021-02-11 DIAGNOSIS — S065X5D Traumatic subdural hemorrhage with loss of consciousness greater than 24 hours with return to pre-existing conscious level, subsequent encounter: Secondary | ICD-10-CM | POA: Diagnosis not present

## 2021-02-11 DIAGNOSIS — I2510 Atherosclerotic heart disease of native coronary artery without angina pectoris: Secondary | ICD-10-CM | POA: Diagnosis present

## 2021-02-11 DIAGNOSIS — Z800 Family history of malignant neoplasm of digestive organs: Secondary | ICD-10-CM | POA: Diagnosis not present

## 2021-02-11 DIAGNOSIS — Z8249 Family history of ischemic heart disease and other diseases of the circulatory system: Secondary | ICD-10-CM

## 2021-02-11 DIAGNOSIS — Z79899 Other long term (current) drug therapy: Secondary | ICD-10-CM | POA: Diagnosis not present

## 2021-02-11 DIAGNOSIS — I69028 Other speech and language deficits following nontraumatic subarachnoid hemorrhage: Secondary | ICD-10-CM

## 2021-02-11 DIAGNOSIS — I4821 Permanent atrial fibrillation: Secondary | ICD-10-CM

## 2021-02-11 DIAGNOSIS — I130 Hypertensive heart and chronic kidney disease with heart failure and stage 1 through stage 4 chronic kidney disease, or unspecified chronic kidney disease: Secondary | ICD-10-CM | POA: Diagnosis present

## 2021-02-11 DIAGNOSIS — Z87891 Personal history of nicotine dependence: Secondary | ICD-10-CM | POA: Diagnosis not present

## 2021-02-11 DIAGNOSIS — S065XAD Traumatic subdural hemorrhage with loss of consciousness status unknown, subsequent encounter: Secondary | ICD-10-CM | POA: Diagnosis not present

## 2021-02-11 DIAGNOSIS — K219 Gastro-esophageal reflux disease without esophagitis: Secondary | ICD-10-CM | POA: Diagnosis present

## 2021-02-11 DIAGNOSIS — E1169 Type 2 diabetes mellitus with other specified complication: Secondary | ICD-10-CM | POA: Diagnosis not present

## 2021-02-11 LAB — GLUCOSE, CAPILLARY
Glucose-Capillary: 155 mg/dL — ABNORMAL HIGH (ref 70–99)
Glucose-Capillary: 215 mg/dL — ABNORMAL HIGH (ref 70–99)
Glucose-Capillary: 170 mg/dL — ABNORMAL HIGH (ref 70–99)
Glucose-Capillary: 206 mg/dL — ABNORMAL HIGH (ref 70–99)

## 2021-02-11 MED ORDER — APIXABAN 2.5 MG PO TABS
2.5000 mg | ORAL_TABLET | Freq: Two times a day (BID) | ORAL | Status: DC
  Administered 2021-02-11 – 2021-02-27 (×32): 2.5 mg via ORAL
  Filled 2021-02-11 (×32): qty 1

## 2021-02-11 MED ORDER — ISOSORBIDE MONONITRATE ER 30 MG PO TB24
30.0000 mg | ORAL_TABLET | Freq: Every day | ORAL | 0 refills | Status: DC
Start: 2021-02-12 — End: 2021-02-27

## 2021-02-11 MED ORDER — DORZOLAMIDE HCL-TIMOLOL MAL 2-0.5 % OP SOLN
1.0000 [drp] | Freq: Two times a day (BID) | OPHTHALMIC | Status: DC
  Administered 2021-02-11 – 2021-02-27 (×32): 1 [drp] via OPHTHALMIC
  Filled 2021-02-11: qty 10

## 2021-02-11 MED ORDER — POLYETHYLENE GLYCOL 3350 17 G PO PACK: 17.0000 g | PACK | Freq: Every day | ORAL | 0 refills | Status: DC | PRN

## 2021-02-11 MED ORDER — ONDANSETRON HCL 4 MG PO TABS: 4.0000 mg | ORAL_TABLET | ORAL | Status: DC | PRN

## 2021-02-11 MED ORDER — ONDANSETRON HCL 4 MG/2ML IJ SOLN: 4.0000 mg | INTRAMUSCULAR | Status: DC | PRN

## 2021-02-11 MED ORDER — POLYETHYLENE GLYCOL 3350 17 G PO PACK: 17.0000 g | PACK | Freq: Every day | ORAL | Status: DC | PRN

## 2021-02-11 MED ORDER — ACETAMINOPHEN 650 MG RE SUPP: 650.0000 mg | RECTAL | Status: DC | PRN

## 2021-02-11 MED ORDER — BISACODYL 10 MG RE SUPP: 10.0000 mg | Freq: Every day | RECTAL | Status: DC | PRN

## 2021-02-11 MED ORDER — ISOSORBIDE MONONITRATE ER 30 MG PO TB24
30.0000 mg | ORAL_TABLET | Freq: Every day | ORAL | Status: DC
  Administered 2021-02-12 – 2021-02-14 (×3): 30 mg via ORAL
  Filled 2021-02-11 (×3): qty 1

## 2021-02-11 MED ORDER — ACETAMINOPHEN 325 MG PO TABS
650.0000 mg | ORAL_TABLET | ORAL | Status: DC | PRN
  Administered 2021-02-14 – 2021-02-26 (×3): 650 mg via ORAL
  Filled 2021-02-11 (×3): qty 2

## 2021-02-11 MED ORDER — INSULIN ASPART 100 UNIT/ML IJ SOLN
3.0000 [IU] | Freq: Three times a day (TID) | INTRAMUSCULAR | Status: DC
  Administered 2021-02-11 – 2021-02-14 (×7): 3 [IU] via SUBCUTANEOUS

## 2021-02-11 MED ORDER — SENNA 8.6 MG PO TABS
1.0000 | ORAL_TABLET | Freq: Two times a day (BID) | ORAL | Status: DC
  Administered 2021-02-13 – 2021-02-24 (×8): 8.6 mg via ORAL
  Filled 2021-02-11 (×30): qty 1

## 2021-02-11 MED ORDER — INSULIN GLARGINE-YFGN 100 UNIT/ML ~~LOC~~ SOLN
10.0000 [IU] | Freq: Every day | SUBCUTANEOUS | Status: DC
  Administered 2021-02-12 – 2021-02-20 (×9): 10 [IU] via SUBCUTANEOUS
  Filled 2021-02-11 (×11): qty 0.1

## 2021-02-11 MED ORDER — APIXABAN 2.5 MG PO TABS
2.5000 mg | ORAL_TABLET | Freq: Two times a day (BID) | ORAL | Status: DC
  Administered 2021-02-11: 2.5 mg via ORAL
  Filled 2021-02-11: qty 1

## 2021-02-11 MED ORDER — HYDRALAZINE HCL 50 MG PO TABS
100.0000 mg | ORAL_TABLET | Freq: Three times a day (TID) | ORAL | Status: DC
  Administered 2021-02-11 – 2021-02-27 (×48): 100 mg via ORAL
  Filled 2021-02-11 (×48): qty 2

## 2021-02-11 MED ORDER — TORSEMIDE 20 MG PO TABS
60.0000 mg | ORAL_TABLET | Freq: Every day | ORAL | Status: DC
  Administered 2021-02-12 – 2021-02-24 (×12): 60 mg via ORAL
  Filled 2021-02-11 (×13): qty 3

## 2021-02-11 MED ORDER — OXYCODONE HCL 5 MG PO TABS
5.0000 mg | ORAL_TABLET | Freq: Four times a day (QID) | ORAL | Status: DC | PRN
  Filled 2021-02-11: qty 1

## 2021-02-11 MED ORDER — BUTALBITAL-APAP-CAFFEINE 50-325-40 MG PO TABS: 1.0000 | ORAL_TABLET | ORAL | Status: DC | PRN

## 2021-02-11 MED ORDER — HYDROCORTISONE 1 % EX CREA
TOPICAL_CREAM | Freq: Three times a day (TID) | CUTANEOUS | Status: DC | PRN
  Filled 2021-02-11: qty 28

## 2021-02-11 MED ORDER — INSULIN ASPART 100 UNIT/ML IJ SOLN
0.0000 [IU] | Freq: Three times a day (TID) | INTRAMUSCULAR | Status: DC
Start: 2021-02-11 — End: 2021-02-27
  Administered 2021-02-11: 2 [IU] via SUBCUTANEOUS
  Administered 2021-02-12: 1 [IU] via SUBCUTANEOUS
  Administered 2021-02-12 (×2): 2 [IU] via SUBCUTANEOUS
  Administered 2021-02-13: 5 [IU] via SUBCUTANEOUS
  Administered 2021-02-13: 2 [IU] via SUBCUTANEOUS
  Administered 2021-02-14 (×2): 1 [IU] via SUBCUTANEOUS
  Administered 2021-02-15: 2 [IU] via SUBCUTANEOUS
  Administered 2021-02-15: 1 [IU] via SUBCUTANEOUS
  Administered 2021-02-15: 2 [IU] via SUBCUTANEOUS
  Administered 2021-02-16 – 2021-02-17 (×3): 1 [IU] via SUBCUTANEOUS
  Administered 2021-02-17 (×2): 2 [IU] via SUBCUTANEOUS
  Administered 2021-02-18: 1 [IU] via SUBCUTANEOUS
  Administered 2021-02-18: 3 [IU] via SUBCUTANEOUS
  Administered 2021-02-18: 2 [IU] via SUBCUTANEOUS
  Administered 2021-02-19 (×2): 1 [IU] via SUBCUTANEOUS
  Administered 2021-02-19 – 2021-02-20 (×2): 2 [IU] via SUBCUTANEOUS
  Administered 2021-02-20: 3 [IU] via SUBCUTANEOUS
  Administered 2021-02-20: 2 [IU] via SUBCUTANEOUS
  Administered 2021-02-21: 3 [IU] via SUBCUTANEOUS
  Administered 2021-02-21 – 2021-02-23 (×6): 2 [IU] via SUBCUTANEOUS
  Administered 2021-02-23: 18:00:00 3 [IU] via SUBCUTANEOUS
  Administered 2021-02-23: 08:00:00 1 [IU] via SUBCUTANEOUS
  Administered 2021-02-24 (×3): 2 [IU] via SUBCUTANEOUS
  Administered 2021-02-25: 3 [IU] via SUBCUTANEOUS
  Administered 2021-02-25 – 2021-02-27 (×5): 2 [IU] via SUBCUTANEOUS

## 2021-02-11 NOTE — Progress Notes (Signed)
Physical Therapy Treatment Patient Details Name: Kyle Roberson MRN: [id number] DOB: [birth year] Today's Date: 02/11/2021   History of Present Illness Kyle Roberson is an 80 y.o. male with a PMH significant for CAD, diabetes, hypertension, GERD, hyperlipidemia, chronic A. fib.  CT head shows acute subdural bleeding.  Patient s/p left craniotomy for evacuation of subdural hematoma 02/05/21.    PT Comments    Pt resting in recliner upon PT arrival (recently finished with OT session); agreeable to PT session with some encouragement.  CGA to stand from recliner 1st trial and min assist to stand from recliner 2nd trial (post ambulation).  CGA mostly for ambulation 150 feet with RW but min assist occasionally for balance.  Tolerated standing LE ex's with B UE support on RW fairly well.  BP 125/55 at rest beginning of session; BP 126/54 at rest end of session.  Will continue to focus on strengthening, balance, and progressive functional mobility during hospitalization.    Recommendations for follow up therapy are one component of a multi-disciplinary discharge planning process, led by the attending physician.  Recommendations may be updated based on patient status, additional functional criteria and insurance authorization.  Follow Up Recommendations  Acute inpatient rehab (3hours/day)     Assistance Recommended at Discharge Frequent or constant Supervision/Assistance  Equipment Recommendations  Rolling walker (2 wheels);BSC/3in1    Recommendations for Other Services OT consult     Precautions / Restrictions Precautions Precautions: Fall Precaution Comments: Maintain BP <160; HOB >30 degrees Restrictions Weight Bearing Restrictions: No     Mobility  Bed Mobility               General bed mobility comments: Deferred (pt up in recliner beginning/end of session)    Transfers Overall transfer level: Needs assistance Equipment used: Rolling walker (2 wheels) Transfers: Sit  to/from Stand Sit to Stand: Min guard;Min assist           General transfer comment: vc's for UE placement; CGA to stand from recliner 1st trial; min assist to stand from recliner 2nd trial (post ambulation)    Ambulation/Gait Ambulation/Gait assistance: Min guard;Min assist Gait Distance (Feet): 150 Feet Assistive device: Rolling walker (2 wheels) Gait Pattern/deviations: Decreased step length - left;Decreased step length - right;Decreased dorsiflexion - right;Decreased dorsiflexion - left;Decreased stride length Gait velocity: decreased     General Gait Details: partial step through gait pattern; mostly CGA but min assist occasionally for balance   Stairs             Wheelchair Mobility    Modified Rankin (Stroke Patients Only)       Balance Overall balance assessment: Needs assistance Sitting-balance support: No upper extremity supported;Feet supported Sitting balance-Leahy Scale: Good Sitting balance - Comments: steady sitting reaching within BOS   Standing balance support: Single extremity supported Standing balance-Leahy Scale: Fair Standing balance comment: requires at least single UE support for dynamic standing activities                            Cognition Arousal/Alertness: Awake/alert Behavior During Therapy: WFL for tasks assessed/performed Overall Cognitive Status: No family/caregiver present to determine baseline cognitive functioning Area of Impairment: Following commands;Problem solving;Safety/judgement                       Following Commands: Follows multi-step commands inconsistently;Follows multi-step commands with increased time Safety/Judgement: Decreased awareness of deficits;Decreased awareness of safety   Problem Solving: Requires verbal cues;Requires  tactile cues          Exercises Total Joint Exercises Hip ABduction/ADduction: AROM;Strengthening;Both;10 reps;Standing (B UE support on RW) Standing Hip  Extension: AROM;Strengthening;Both;10 reps;Standing (B UE support on RW) General Exercises - Lower Extremity Hip Flexion/Marching: AROM;Strengthening;Both;10 reps;Standing (B UE support on RW) Heel Raises: AROM;Strengthening;Both;10 reps;Standing (B UE support on RW)    General Comments  Nursing cleared pt for participation in physical therapy.  Pt agreeable to PT session.      Pertinent Vitals/Pain Pain Assessment: Faces Pain Location: general body aches Pain Descriptors / Indicators: Aching;Discomfort Pain Intervention(s): Limited activity within patient's tolerance;Monitored during session;Repositioned Vitals (HR and O2 on room air) stable and WFL throughout treatment session.    Home Living                          Prior Function            PT Goals (current goals can now be found in the care plan section) Acute Rehab PT Goals Patient Stated Goal: get better so I can go home PT Goal Formulation: With patient/family Time For Goal Achievement: 02/20/21 Potential to Achieve Goals: Good Progress towards PT goals: Progressing toward goals    Frequency    7X/week      PT Plan Current plan remains appropriate    Co-evaluation              AM-PAC PT "6 Clicks" Mobility   Outcome Measure  Help needed turning from your back to your side while in a flat bed without using bedrails?: None Help needed moving from lying on your back to sitting on the side of a flat bed without using bedrails?: A Little Help needed moving to and from a bed to a chair (including a wheelchair)?: A Little Help needed standing up from a chair using your arms (e.g., wheelchair or bedside chair)?: A Little   Help needed climbing 3-5 steps with a railing? : A Little 6 Click Score: 16    End of Session Equipment Utilized During Treatment: Gait belt Activity Tolerance: Patient tolerated treatment well Patient left: in chair;with call bell/phone within reach Nurse Communication:  Mobility status;Precautions PT Visit Diagnosis: Muscle weakness (generalized) (M62.81);Difficulty in walking, not elsewhere classified (R26.2);Hemiplegia and hemiparesis;Unsteadiness on feet (R26.81) Hemiplegia - Right/Left: Right Hemiplegia - dominant/non-dominant: Dominant Hemiplegia - caused by: Other cerebrovascular disease     Time: 0959-1040 PT Time Calculation (min) (ACUTE ONLY): 41 min  Charges:  $Gait Training: 8-22 mins $Therapeutic Exercise: 8-22 mins $Therapeutic Activity: 8-22 mins                    Leitha Bleak, PT 02/11/21, 10:59 AM

## 2021-02-11 NOTE — IPOC Note (Signed)
Overall Plan of Care [facility]) Patient Details Name: Kyle Roberson MRN: [id number] DOB: September 30, 1940  Admitting Diagnosis: Traumatic subdural hematoma  Hospital Problems: Principal Problem:   Traumatic subdural hematoma     Functional Problem List: Nursing Bladder, Edema, Endurance, Medication Management, Safety, Skin Integrity, Perception  PT Balance, Endurance, Motor, Perception, Safety  OT Balance, Cognition, Endurance, Motor, Safety, Sensory, Vision  SLP Cognition  TR         Basic ADL's: OT Grooming, Bathing, Dressing, Toileting     Advanced  ADL's: OT       Transfers: PT Bed Mobility, Bed to Chair, Car, Manufacturing systems engineer, Metallurgist: PT Ambulation, Stairs     Additional Impairments: OT Fuctional Use of Upper Extremity  SLP Social Cognition   Problem Solving, Memory, Attention  TR      Anticipated Outcomes Item Anticipated Outcome  Self Feeding N/A  Swallowing      Basic self-care  supervision to [employer] A  Toileting  supervision A   Bathroom Transfers supervision A  Bowel/Bladder  supervision  Transfers  Supervision  Locomotion  Supervision  Communication     Cognition  Mod I-Supervision  Pain  n/a  Safety/Judgment  supervision and no falls   Therapy Plan: PT Intensity: Minimum of 1-2 x/day ,45 to 90 minutes PT Frequency: 5 out of 7 days PT Duration Estimated Length of Stay: 12-14 Days OT Intensity: Minimum of 1-2 x/day, 45 to 90 minutes OT Frequency: 5 out of 7 days OT Duration/Estimated Length of Stay: 12-14 days SLP Intensity: Minumum of 1-2 x/day, 30 to 90 minutes SLP Frequency: 3 to 5 out of 7 days SLP Duration/Estimated Length of Stay: 12-14 days   Due to the current state of emergency, patients may not be receiving their 3-hours of Medicare-mandated therapy.   Team Interventions: Nursing Interventions Patient/Family Education, Bladder Management, Disease Management/Prevention, Medication Management, Skin  Care/Wound Management, Discharge Planning  PT interventions Ambulation/gait training, Community reintegration, DME/adaptive equipment instruction, Neuromuscular re-education, Psychosocial support, Stair training, UE/LE Strength taining/ROM, Training and development officer, Functional electrical stimulation, Discharge planning, Pain management, Skin care/wound management, Therapeutic Activities, UE/LE Coordination activities, Cognitive remediation/compensation, Disease management/prevention, Patient/family education, Functional mobility training, Splinting/orthotics, Therapeutic Exercise  OT Interventions Balance/vestibular training, Cognitive remediation/compensation, Community reintegration, Discharge planning, DME/adaptive equipment instruction, Functional mobility training, Neuromuscular re-education, Patient/family education, Self Care/advanced ADL retraining, Therapeutic Activities, Therapeutic Exercise, UE/LE Strength taining/ROM, UE/LE Coordination activities, Visual/perceptual remediation/compensation  SLP Interventions Cognitive remediation/compensation, Internal/external aids, Therapeutic Activities, Environmental controls, Cueing hierarchy, Functional tasks, Patient/family education  TR Interventions    SW/CM Interventions Discharge Planning, Psychosocial Support, Patient/Family Education   Barriers to Discharge MD  Medical stability  Nursing Decreased caregiver support, Incontinence, Wound Care, Lack of/limited family support, Weight, Medication compliance Lives in 1 level home with ramped entrance. Lives with SO and can provide assist at discharge.  PT      OT      SLP      SW Decreased caregiver support, Lack of/limited family support     Team Discharge Planning: Destination: PT-Home ,OT- Home , SLP-Home Projected Follow-up: PT-Outpatient PT, OT-  Home health OT, SLP- (TBD) Projected Equipment Needs: PT-To be determined, OT- To be determined, SLP-None recommended by SLP Equipment  Details: PT- , OT-  Patient/family involved in discharge planning: PT- Patient, Family member/caregiver,  OT-Patient, SLP-Patient  MD ELOS: 12-14 days Medical Rehab Prognosis:  Excellent Assessment: The patient has been admitted for CIR therapies with the diagnosis of TBI. The team will  be addressing functional mobility, strength, stamina, balance, safety, adaptive techniques and equipment, self-care, bowel and bladder mgt, patient and caregiver education, cognition, NMR, community reentry. Goals have been set at supervision with mobility and self-care and sup/mod I with cognition.   Due to the current state of emergency, patients may not be receiving their 3 hours per day of Medicare-mandated therapy.    Meredith Staggers, MD, FAAPMR     See Team Conference Notes for weekly updates to the plan of care

## 2021-02-11 NOTE — Progress Notes (Signed)
PMR Admission Coordinator Pre-Admission Assessment  Patient: Kyle Roberson is an 80 y.o., male MRN: [id number] DOB: 04/30/1940 Height: 5' 8" (172.7 cm) Weight: 104.3 kg  Insurance Information HMO:     PPO: yes     PCP:      IPA:      80/20:      OTHER:  PRIMARY: [employer] Medicare      Policy#: [id number]      Subscriber: pt CM Name: Gloria      Phone#:      Fax#: [phone] Pre-Cert#: [id number] auth for CIR given via Gloria with [employer] Medicare.  Updates due to Kara H at fax listed above on 11/25.  (Phone [phone])      Employer:  Benefits:  Phone #: [phone]     Name:  Eff. Date: 03/23/20     Deduct: $100 (met)      Out of Pocket Max: $1400 (met $440.50)      Life Max: n/a CIR: 80%      SNF: 100% Outpatient: 80%     Co-Ins: 20% Home Health: 100%      Co-Pay:  DME: 80%     Co-Ins: 20% Providers:  SECONDARY: BCBS State Health Plan      Policy#: [id number]     Phone#: [phone]  Financial Counselor:       Phone#:   The "Data Collection Information Summary" for patients in Inpatient Rehabilitation Facilities with attached "Privacy Act Statement-Health Care Records" was provided and verbally reviewed with: Patient and Family  Emergency Contact Information Contact Information     Name Relation Home Work Mobile   Pitman, [name] Significant other   [phone]       Current Medical History  Patient Admitting Diagnosis: SDH s/p crani evacuation  History of Present Illness: Kyle Roberson is an 80-year-old right-handed male with history of diabetes mellitus, hypertension, CKD stage IV, hyperlipidemia, CAD with stenting maintained on Eliquis, chronic diastolic congestive heart failure, tobacco abuse, 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  Presented to [facility] 02/04/2021 with bouts of dizziness and difficulty with hand coordination over a 1 day period as well as expressive aphasia with increasing right side weakness.  Patient does endorse a fall in  August 2022 cranial CT scan at that time showed no acute intracranial process..  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  No other new acute intracranial abnormality.  Admission chemistries unremarkable aside glucose 261 BUN 39 creatinine 2.04, hemoglobin A1c 7.3.  MRI 02/03/2021 showed nonacute subdural hematoma on the left with cortical mass-effect and 5 mm midline shift.  MRA no emergent finding of proximal flow-limiting stenosis.  Follow-up neurosurgery due to patient's progressive changes underwent left craniotomy evacuation of subdural hematoma 02/05/2021 per Dr. Cook.  Latest follow-up CT scan showed no evidence of new intracranial hemorrhage.  Patient was cleared to resume chronic Eliquis 02/11/2021.  Tolerating a regular consistency diet.  Therapy evaluations completed due to patient's right side weakness and aphasia was admitted for a comprehensive rehab program.    Patient's medical record from [place] has been reviewed by the rehabilitation admission coordinator and physician.  Past Medical History  Past Medical History:  Diagnosis Date   AKI (acute kidney injury) (HCC) 07/06/2018   CAD (coronary artery disease)    Cellulitis of   lower extremity 05/31/2019   Diabetes (HCC)    GERD (gastroesophageal reflux disease)    HLD (hyperlipidemia)    HTN (hypertension)     Has the patient had major surgery during 100 days prior to admission? Yes  Family History   family history includes CAD in his father; Diabetes in his brother; Pancreatic cancer in his mother.  Current Medications  Current Facility-Administered Medications:    acetaminophen (TYLENOL) tablet 650 mg, 650 mg, Oral, Q4H PRN, 650 mg at 02/08/21 1132 **OR** acetaminophen (TYLENOL)  suppository 650 mg, 650 mg, Rectal, Q4H PRN, Koch, Danielle Lee, PA   apixaban (ELIQUIS) tablet 2.5 mg, 2.5 mg, Oral, BID, Koch, Danielle Lee, PA, 2.5 mg at 02/11/21 0842   bisacodyl (DULCOLAX) suppository 10 mg, 10 mg, Rectal, Daily PRN, Koch, Danielle Lee, PA   butalbital-acetaminophen-caffeine (FIORICET) 50-325-40 MG per tablet 1 tablet, 1 tablet, Oral, Q4H PRN, Koch, Danielle Lee, PA   Chlorhexidine Gluconate Cloth 2 % PADS 6 each, 6 each, Topical, Daily, Cook, Steven, MD, 6 each at 02/11/21 0838   diphenhydrAMINE (BENADRYL) capsule 25 mg, 25 mg, Oral, Q8H PRN, Morrison, Brenda, NP, 25 mg at 02/07/21 0620   dorzolamide-timolol (COSOPT) 22.3-6.8 MG/ML ophthalmic solution 1 drop, 1 drop, Left Eye, BID, Koch, Danielle Lee, PA, 1 drop at 02/11/21 0838   hydrALAZINE (APRESOLINE) injection 10 mg, 10 mg, Intravenous, Q8H PRN, Kumar, Pardeep, MD, 10 mg at 02/09/21 2309   hydrALAZINE (APRESOLINE) tablet 100 mg, 100 mg, Oral, TID, Koch, Danielle Lee, PA, 100 mg at 02/11/21 0836   hydrocortisone cream 1 %, , Topical, TID PRN, Yarbrough, Chester, MD, Given at 02/08/21 0324   insulin aspart (novoLOG) injection 0-9 Units, 0-9 Units, Subcutaneous, TID WC, Koch, Danielle Lee, PA, 2 Units at 02/11/21 0837   insulin aspart (novoLOG) injection 3 Units, 3 Units, Subcutaneous, TID WC, Shah, Vipul, MD, 3 Units at 02/11/21 0837   insulin glargine-yfgn (SEMGLEE) injection 10 Units, 10 Units, Subcutaneous, Daily, Kumar, Pardeep, MD, 10 Units at 02/11/21 0837   isosorbide mononitrate (IMDUR) 24 hr tablet 30 mg, 30 mg, Oral, Daily, Patel, Pranav M, MD, 30 mg at 02/11/21 0836   naloxone (NARCAN) injection 0.08 mg, 0.08 mg, Intravenous, PRN, Koch, Danielle Lee, PA   ondansetron (ZOFRAN) tablet 4 mg, 4 mg, Oral, Q4H PRN **OR** ondansetron (ZOFRAN) injection 4 mg, 4 mg, Intravenous, Q4H PRN, Koch, Danielle Lee, PA   oxyCODONE (Oxy IR/ROXICODONE) immediate release tablet 5 mg, 5 mg, Oral, Q6H PRN, Koch, Danielle Lee, PA, 5 mg  at 02/06/21 0809   polyethylene glycol (MIRALAX / GLYCOLAX) packet 17 g, 17 g, Oral, Daily PRN, Koch, Danielle Lee, PA   promethazine (PHENERGAN) tablet 12.5-25 mg, 12.5-25 mg, Oral, Q4H PRN, Koch, Danielle Lee, PA   senna (SENOKOT) tablet 8.6 mg, 1 tablet, Oral, BID, Koch, Danielle Lee, PA, 8.6 mg at 02/11/21 0837   sodium phosphate (FLEET) 7-19 GM/118ML enema 1 enema, 1 enema, Rectal, Once PRN, Koch, Danielle Lee, PA   torsemide (DEMADEX) tablet 60 mg, 60 mg, Oral, Daily, Koch, Danielle Lee, PA, 60 mg at 02/11/21 0842  Patients Current Diet:  Diet Order             Diet Carb Modified Fluid consistency: Thin; Room service appropriate? Yes  Diet effective now                   Precautions / Restrictions Precautions Precautions: Fall Precaution Comments: Maintain BP <160; HOB >30 degrees Restrictions Weight Bearing Restrictions: No     Has the patient had 2 or more falls or a fall with injury in the past year? Yes  Prior Activity Level Limited Community (1-2x/wk): independent, most recently with a quad cane following most recent fall, not driving  Prior Functional Level Self Care: Did the patient need help bathing, dressing, using the toilet or eating? Independent  Indoor Mobility: Did the patient need assistance with walking from room to room (with or without device)? Independent  Stairs: Did the patient need assistance with internal or external stairs (with or without device)? Independent  Functional Cognition: Did the patient need help planning regular tasks such as shopping or remembering to take medications? Independent  Patient Information Are you of Hispanic, Latino/a,or Spanish origin?: A. No, not of Hispanic, Latino/a, or Spanish origin What is your race?: A. White Do you need or want an interpreter to communicate with a doctor or health care staff?: 0. No  Patient's Response To:  Health Literacy and Transportation Is the patient able to respond to health  literacy and transportation needs?: Yes Health Literacy - How often do you need to have someone help you when you read instructions, pamphlets, or other written material from your doctor or pharmacy?: Never In the past 12 months, has lack of transportation kept you from medical appointments or from getting medications?: No In the past 12 months, has lack of transportation kept you from meetings, work, or from getting things needed for daily living?: No  Home Assistive Devices / Equipment Home Equipment: Cane - quad  Prior Device Use: Indicate devices/aids used by the patient prior to current illness, exacerbation or injury? Walker  Current Functional Level Cognition  Overall Cognitive Status: No family/caregiver present to determine baseline cognitive functioning Orientation Level: Oriented X4 Following Commands: Follows multi-step commands inconsistently, Follows multi-step commands with increased time Safety/Judgement: Decreased awareness of deficits, Decreased awareness of safety General Comments: TED hose donned beginning of session    Extremity Assessment (includes Sensation/Coordination)  Upper Extremity Assessment:  (R UE grossly 4-/5 throughout, moderate dysmetria and coordination deficits; mild inattention, positive pronator drift.  L UE grossly WFL) RUE Deficits / Details: Grip 4/5, Triceps 4/5, Deltoids 3+/5 RUE Coordination: decreased fine motor, decreased gross motor (Needed step by step cuing, unable to perform 5 finger opposition, RAM, finger nose finger)  Lower Extremity Assessment:  (R LE grossly 4-/5, denies sensory deficit; L LE grossly 4+/5)    ADLs  Overall ADL's : Needs assistance/impaired General ADL Comments: Pt static sitting on EOB for theraputty/ vision exercises~ 10 min. MAX A for perihygiene, standing.    Mobility  Overal bed mobility: Needs Assistance Bed Mobility: Supine to Sit Supine to sit: Supervision General bed mobility comments: vc's for  technique; increased effort for pt to perform on own    Transfers  Overall transfer level: Needs assistance Equipment used: Rolling walker (2 wheels) Transfers: Sit to/from Stand Sit to Stand: Min assist Bed to/from chair/wheelchair/BSC transfer type:: Stand pivot Stand pivot transfers: Min assist (with RW, stand pivot bed>recliner) General transfer comment: vc's for UE placement; assist for balance with transfers    Ambulation / Gait / Stairs / Wheelchair Mobility  Ambulation/Gait Ambulation/Gait assistance: Min guard, Min assist Gait Distance (Feet): 120 Feet Assistive device: Rolling walker (2 wheels) Gait Pattern/deviations: Decreased step length - left, Decreased step length - right, Decreased dorsiflexion - right, Decreased dorsiflexion - left, Decreased stride length General Gait Details: partial step through gait pattern; vc's to look up when walking Gait velocity: decreased      Posture / Balance Dynamic Sitting Balance Sitting balance - Comments: steady sitting reaching within BOS Balance Overall balance assessment: Needs assistance Sitting-balance support: No upper extremity supported, Feet supported Sitting balance-Leahy Scale: Good Sitting balance - Comments: steady sitting reaching within BOS Standing balance support: Bilateral upper extremity supported Standing balance-Leahy Scale: Fair Standing balance comment: requires B UE support for dynamic standing activities    Special needs/care consideration Skin scalp incision  and Diabetic management yes   Previous Home Environment (from acute therapy documentation) Living Arrangements: Spouse/significant other Available Help at Discharge: Family, Available PRN/intermittently Type of Home: House Home Layout: One level, Laundry or work area in basement (no essential needs in basement area) Home Access: Ramped entrance Home Care Services: No  Discharge Living Setting Plans for Discharge Living Setting: Patient's home,  Lives with (comment) (significant other ([name])) Type of Home at Discharge: House Discharge Home Layout: One level, Laundry or work area in basement Discharge Home Access: Ramped entrance Discharge Bathroom Shower/Tub: Walk-in shower Discharge Bathroom Toilet: Handicapped height Discharge Bathroom Accessibility: Yes How Accessible: Accessible via walker Does the patient have any problems obtaining your medications?: No  Social/Family/Support Systems Patient Roles: Partner Anticipated Caregiver: [name] Pittman Anticipated Caregiver's Contact Information: [phone] Ability/Limitations of Caregiver: will have other caregivers to provide supervision (if needed) if she needs to be out of the house Caregiver Availability: 24/7 Discharge Plan Discussed with Primary Caregiver: Yes Is Caregiver In Agreement with Plan?: Yes Does Caregiver/Family have Issues with Lodging/Transportation while Pt is in Rehab?: No  Goals Patient/Family Goal for Rehab: PT/OT/SLP supervision Expected length of stay: 12-14 days Pt/Family Agrees to Admission and willing to participate: Yes Program Orientation Provided & Reviewed with Pt/Caregiver Including Roles  & Responsibilities: Yes  Barriers to Discharge: Insurance for SNF coverage  Decrease burden of Care through IP rehab admission: n/a  Possible need for SNF placement upon discharge: NO  Patient Condition: I have reviewed medical records from [place], spoken with CM, and patient and spouse. I discussed via phone for inpatient rehabilitation assessment.  Patient will benefit from ongoing PT, OT, and SLP, can actively participate in 3 hours of therapy a day 5 days of the week, and can make measurable gains during the admission.  Patient will also benefit from the coordinated team approach during an Inpatient Acute Rehabilitation admission.  The patient will receive intensive therapy as well as Rehabilitation physician, nursing, social worker, and care management  interventions.  Due to bladder management, bowel management, safety, skin/wound care, disease management, medication administration, pain management, and patient education the patient requires 24 hour a day rehabilitation nursing.  The patient is currently min assist with mobility and mod/max assist basic ADLs.  Discharge setting and therapy post discharge at home with home health is anticipated.  Patient has agreed to participate in the Acute Inpatient Rehabilitation Program and will admit today.  Preadmission Screen Completed By:  Desirea Mizrahi E Shakeya Kerkman, PT, DPT 02/11/2021 10:31 AM ______________________________________________________________________   Discussed status with Dr. Lovorn on 02/11/21  at 10:31 AM  and received approval for admission today.  Admission Coordinator:  Tad Fancher E Krystyna Cleckley, PT, DPT time 10:31 AM /Date 02/11/21    Assessment/Plan: Diagnosis: Does the need for close, 24 hr/day Medical supervision in concert with the patient's rehab needs make it unreasonable for this patient to be served in a less intensive setting? Yes Co-Morbidities requiring supervision/potential complications: SDH, cognitive issues due to SDH, s/p crani for evacuation- Afib- dCHF, CKD4, HTN Due to bladder management, bowel management, safety,   skin/wound care, disease management, medication administration, pain management, and patient education, does the patient require 24 hr/day rehab nursing? Yes Does the patient require coordinated care of a physician, rehab nurse, PT, OT, and SLP to address physical and functional deficits in the context of the above medical diagnosis(es)? Yes Addressing deficits in the following areas: balance, endurance, locomotion, strength, transferring, bowel/bladder control, bathing, dressing, feeding, grooming, toileting, and cognition Can the patient actively participate in an intensive therapy program of at least 3 hrs of therapy 5 days a week? Yes The potential for patient to make  measurable gains while on inpatient rehab is good Anticipated functional outcomes upon discharge from inpatient rehab: supervision PT, supervision OT, supervision SLP Estimated rehab length of stay to reach the above functional goals is: 12-14 days Anticipated discharge destination: Home 10. Overall Rehab/Functional Prognosis: good   MD Signature:  

## 2021-02-11 NOTE — Progress Notes (Signed)
Inpatient Rehabilitation Care Coordinator Assessment and Plan Patient Details  Name: Kyle Roberson MRN: [id number] Date of Birth: 1940/08/13  Today's Date: 02/11/2021  Hospital Problems: Principal Problem:   Traumatic subdural hematoma  Past Medical History:  Past Medical History:  Diagnosis Date   AKI (acute kidney injury) ([place]) 07/06/2018   CAD (coronary artery disease)    Cellulitis of lower extremity 05/31/2019   Diabetes ([place])    GERD (gastroesophageal reflux disease)    HLD (hyperlipidemia)    HTN (hypertension)    Past Surgical History:  Past Surgical History:  Procedure Laterality Date   BLEPHAROPLASTY     CARDIAC CATHETERIZATION     CORONARY STENT INTERVENTION     CRANIOTOMY Left 02/04/2021   Procedure: CRANIOTOMY FOR LEFT SUBDURAL  HEMATOMA EVACUATION;  Surgeon: Deetta Perla, MD;  Location: [facility] ORS;  Service: Neurosurgery;  Laterality: Left;   CYSTOSCOPY     HERNIA REPAIR     Social History:  reports that he has quit smoking. He has never used smokeless tobacco. He reports that he does not currently use alcohol after a past usage of about 2.0 standard drinks per week. He reports that he does not currently use drugs.  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 Education: high school grad; some college Health Literacy - How often do you need to have someone help you when you read instructions, pamphlets, or other written material from your doctor or pharmacy?:  Never Writes: Yes Employment Status: Retired Date Retired/Disabled/Unemployed: [place] on when he retired Public relations account executive Issues: Denies Guardian/Conservator: N/A   Abuse/Neglect Abuse/Neglect Assessment Can Be Completed: Yes Physical Abuse: Denies Verbal Abuse: Denies Sexual Abuse: Denies Exploitation of patient/patient's resources: Denies Self-Neglect: Denies  Patient response to: Social Isolation - How often do you feel lonely or isolated from those around you?: Never  Emotional Status Pt's affect, behavior and adjustment status: Pt tired at time of visit but willing to answer questions for SW. Recent Psychosocial Issues: Denies Psychiatric History: Denies Substance Abuse History: Pt quit smoking cigarettes 33 years ago and occassional wine but has not had in months per his partner Kyle Roberson.  Patient / Family Perceptions, Expectations & Goals Pt/Family understanding of illness & functional limitations: Pt and s/o have general understanding of pt care needs Premorbid pt/family roles/activities: Independent Anticipated changes in roles/activities/participation: Assistance with ADLs/IADLs Pt/family expectations/goals: Pt goal "getting well so I can get out of here." Kyle Roberson's gola " work on Consulting civil engineer."  [employer]: None Premorbid Home Care/DME Agencies: None Transportation available at discharge: Kyle Roberson Is the patient able to respond to transportation needs?: Yes In the past 12 months, has lack of transportation kept you from medical appointments or from getting medications?: No In the past 12 months, has lack of transportation kept you from meetings, work, or from getting things needed for daily living?: No Resource referrals recommended: Neuropsychology  Discharge Planning Living Arrangements: Spouse/significant other Support Systems: Spouse/significant other Type of Residence: Private residence Insurance underwriter Resources: Multimedia programmer (specify)  (Aetna Medicare and [place] PPO) Financial Resources: Social Security, Other (Comment) (retirement/savings) Financial Screen Referred: No Living  Expenses: Own Money Management: Significant Other Does the patient have any problems obtaining your medications?: No Home Management: Patient's partner Kyle Roberson manages all homecare needs Patient/Family Preliminary Plans: No changes Care Coordinator Barriers to Discharge: Decreased caregiver support, Lack of/limited family support Care Coordinator Anticipated Follow Up Needs: HH/OP  Clinical Impression SW met with pt and pt s/o Kyle Roberson to introduce self, explain role, and discuss discharge process. Partner is self employed and has flexibility in her schedule to provide care pt needs.  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 No VA benefits. HCPOA-Kyle Roberson (significant other). DME: ramped entrance, handicap grab bars in shower, high rise toilet seat with rails, quad cane (x2), w/c, rollator, shower chair, lift chair, and 3in1 BSC. She reported concerns about BP and sleep apnea, and carotid artery in neck and not moving wrong when in therapy. SW informed will pass along concerns to medical team.   Rana Snare 02/11/2021, 5:35 PM

## 2021-02-11 NOTE — Discharge Instructions (Signed)
NEUROSURGERY DISCHARGE INSTRUCTIONS  Admission diagnosis: Subdural hematoma [S06.5XAA]  Operative procedure: left sided craniotomy for evacuation of subdural hematoma   What to do after you leave the hospital:  Recommended diet: diabetic diet. Increase protein intake to promote wound healing.  Recommended activity: activity as tolerated. No driving for 6 weeks.You should walk multiple times per day  Special Instructions  No straining, no heavy lifting > 10lbs x 4 weeks.  Keep incision area clean and dry. May shower. Avoid allowing water to directly hit incision. No baths or pools for 6 weeks.   You have sutures or staples that will be removed around post-op day 10-14 either at rehab bloody or at the [place] [facility] if discharged prior to postop day 10-14.  Please take pain medications as directed. Take a stool softener if on pain medications   Please Report any of the following: Nausea or Vomiting, Temperature is greater than 101.56F (38.1C) degrees, Dizziness, Abdominal Pain, Difficulty Breathing or Shortness of Breath, Inability to Eat, drink Fluids, or Take medications, Bleeding, swelling, or drainage from surgical incision sites, New numbness or weakness, and Bowel or bladder dysfunction to the neurosurgeon on call at [phone]  Additional Follow up appointments Please follow up with Dr. Lacinda Axon in [place] clinic as scheduled in 2-3 weeks   Please see below for scheduled appointments:  Future Appointments  Date Time Provider [place]  02/20/2021  4:00 PM [facility]  03/19/2021 10:20 AM Glean Hess, MD [facility]  05/27/2021  4:20 PM Rockey Situ, Kathlene November, MD [facility]

## 2021-02-11 NOTE — Progress Notes (Signed)
Inpatient Rehabilitation  Patient information reviewed and entered into eRehab system by Trea Latner M. Jahari Billy, M.A., CCC/SLP, PPS Coordinator.  Information including medical coding, functional ability and quality indicators will be reviewed and updated through discharge.    

## 2021-02-11 NOTE — H&P (Signed)
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  Per chart review patient lives with significant other.  Modified independent with a quad cane.  1 level home with ramped entrance.  Presented to [facility] 02/04/2021 with bouts of dizziness and difficulty with hand coordination over a 1 day period as well as expressive aphasia with increasing right side weakness.  Patient does endorse a fall in August 2022 cranial CT scan at that time showed no acute intracranial process..  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  No other new acute intracranial abnormality.  Admission chemistries unremarkable aside glucose 261 BUN 39 creatinine 2.04, hemoglobin A1c 7.3.  MRI 02/03/2021 showed nonacute subdural hematoma on the left with cortical mass-effect and 5 mm midline shift.  MRA no emergent finding of proximal flow-limiting stenosis.  Follow-up neurosurgery due to patient's progressive changes underwent left craniotomy evacuation of subdural hematoma 02/05/2021 per Dr. Lacinda Axon.  Latest follow-up CT scan showed no evidence of new intracranial hemorrhage.  Patient was cleared to resume chronic Eliquis 02/11/2021.  Tolerating a regular consistency diet.  Therapy evaluations completed due to patient's right side weakness and aphasia was admitted for a comprehensive rehab program.     Pt reports having occ HA's, but LBM yesterday and using purewick for bladder;   hates urinal.      Review of Systems  Constitutional:  Negative for chills and fever.  HENT:  Negative for hearing loss.   Eyes:  Negative for blurred vision and double vision.       Chronic vision loss due to central retinal vein occlusion  Respiratory:  Negative for cough and shortness of breath.   Cardiovascular:  Positive for palpitations. Negative for chest pain and leg swelling.  Gastrointestinal:  Positive for constipation. Negative for heartburn, nausea and vomiting.       GERD  Genitourinary:  Negative for dysuria, flank pain and hematuria.  Musculoskeletal:  Positive for myalgias.       Recent fall August 2022  Skin:  Negative for rash.  Neurological:  Positive for speech change and weakness.  All other systems reviewed and are negative.     Past Medical History:  Diagnosis Date   AKI (acute kidney injury) ([place]) 07/06/2018   CAD (coronary artery disease)     Cellulitis of lower extremity 05/31/2019   Diabetes (HCC)     GERD (gastroesophageal reflux disease)     HLD (hyperlipidemia)     HTN (hypertension)           Past Surgical History:  Procedure Laterality Date   BLEPHAROPLASTY       CARDIAC CATHETERIZATION       CORONARY STENT INTERVENTION       CRANIOTOMY Left 02/04/2021    Procedure: CRANIOTOMY FOR LEFT SUBDURAL  HEMATOMA EVACUATION;  Surgeon: Deetta Perla, MD;  Location: [facility] ORS;  Service: Neurosurgery;  Laterality: Left;   CYSTOSCOPY       HERNIA REPAIR             Family History  Problem Relation Age of Onset   Pancreatic cancer Mother     CAD Father     Diabetes Brother      Social History:  reports that he has quit smoking. He has never used smokeless tobacco. He reports that he does not currently use alcohol after a past usage of about 2.0 standard drinks per week. He reports that he does not currently use drugs. Allergies:      Allergies  Allergen Reactions   Atorvastatin Hives and Itching   Simvastatin Hives and Itching           Medications Prior to Admission  Medication Sig Dispense Refill   Ascorbic Acid (VITAMIN C PO) Take 1 tablet by mouth in the morning.       Cholecalciferol (VITAMIN D3 PO) Take 1 tablet by mouth in the morning.       Coenzyme Q10-Vitamin E (QUNOL ULTRA COQ10 PO) Take 1 capsule by mouth in the morning.       dorzolamide-timolol (COSOPT) 22.3-6.8 MG/ML ophthalmic solution Place 1 drop into the left eye 2 (two) times daily.       Flaxseed, Linseed, (FLAXSEED OIL PO) Take 1 capsule by mouth in the morning.       glipiZIDE (GLUCOTROL XL) 10 MG 24 hr tablet TAKE 1 TABLET(10 MG) BY MOUTH TWICE DAILY 180 tablet 0   hydrALAZINE (APRESOLINE) 100 MG tablet Take 1 tablet (100 mg total) by mouth 3 (three) times daily. 270 tablet 3   insulin NPH Human (NOVOLIN N) 100 UNIT/ML injection Inject 10-15 Units into the skin daily. Take 10-15 units twice a day after breakfast and before bedtime per Sliding Scale (BS>250: 15 units, BS= 200-250:10 units)       levETIRAcetam (KEPPRA) 500 MG tablet Take 1 tablet (500 mg total) by mouth 2 (two) times daily for 7 days. 14 tablet 0   Multiple Vitamin (MULTIVITAMIN WITH MINERALS) TABS tablet Take 1 tablet by mouth in the morning.       nitroGLYCERIN (NITROLINGUAL) 0.4 MG/SPRAY spray Place 1 spray under the tongue as directed. 12 g 1   Omega-3 Fatty Acids (FISH OIL PO) Take 1 capsule by mouth in the morning.       rosuvastatin (CRESTOR) 5 MG tablet TAKE 1 TABLET BY MOUTH 3 TIMES A WEEK 30 tablet 0   torsemide (DEMADEX) 20 MG tablet Take 3 tablets (60 mg total) by mouth daily. 360 tablet 3   Turmeric (QC TUMERIC COMPLEX PO) Take 1 capsule by mouth in the morning. Qunol Turmeric Curcumin       ELIQUIS 2.5 MG TABS tablet TAKE 1 TABLET(2.5 MG) BY MOUTH TWICE DAILY 180 tablet 1   metolazone (ZAROXOLYN) 2.5 MG tablet TAKE 1 TABLET BY MOUTH EVERY DAY AS NEEDED FOR WEIGH>246 90 tablet 1   potassium chloride SA (KLOR-CON) 20 MEQ tablet Take 1 tablet (20 mEq total) by mouth daily as  needed. (Patient taking differently: Take 20 mEq by mouth daily as needed (with metalozone).) 90 tablet 3      Drug Regimen Review Drug regimen was reviewed and remains appropriate with no significant issues identified   Home: Home Living Family/patient expects to be discharged to:: Private residence Living Arrangements: Spouse/significant other Available Help at Discharge: Family, Available PRN/intermittently Type of Home: House Home Access: Ramped entrance Home Layout: One level, Laundry or work area in basement (no essential needs in basement area) Home Equipment: Kasandra Knudsen - quad   Functional History: Prior Function Prior Level of Function :  Independent/Modified Independent, History of Falls (last six months) Mobility Comments: Indep without assist device at baseline, recent use of quad cane due to worsening balance (since fall).  Does endorse single fall in August, 2022. ADLs Comments: Indep with ADLs, enjoys outdoor work/gardening   Functional Status:  Mobility: Bed Mobility Overal bed mobility: Needs Assistance Bed Mobility: Supine to Sit Supine to sit: Supervision General bed mobility comments: vc's for technique; increased effort for pt to perform on own Transfers Overall transfer level: Needs assistance Equipment used: Rolling walker (2 wheels) Transfers: Sit to/from Stand Sit to Stand: Min assist Bed to/from chair/wheelchair/BSC transfer type:: Stand pivot Stand pivot transfers: Min assist (with RW, stand pivot bed>recliner) General transfer comment: vc's for UE placement; assist for balance with transfers Ambulation/Gait Ambulation/Gait assistance: Min guard, Min assist Gait Distance (Feet): 120 Feet Assistive device: Rolling walker (2 wheels) Gait Pattern/deviations: Decreased step length - left, Decreased step length - right, Decreased dorsiflexion - right, Decreased dorsiflexion - left, Decreased stride length General Gait Details: partial step through gait  pattern; vc's to look up when walking Gait velocity: decreased   ADL: ADL Overall ADL's : Needs assistance/impaired General ADL Comments: Pt static sitting on EOB for theraputty/ vision exercises~ 10 min. MAX A for perihygiene, standing.   Cognition: Cognition Overall Cognitive Status: No family/caregiver present to determine baseline cognitive functioning Orientation Level: Oriented X4 Cognition Arousal/Alertness: Awake/alert Behavior During Therapy: WFL for tasks assessed/performed Overall Cognitive Status: No family/caregiver present to determine baseline cognitive functioning Area of Impairment: Following commands, Problem solving, Safety/judgement Following Commands: Follows multi-step commands inconsistently, Follows multi-step commands with increased time Safety/Judgement: Decreased awareness of deficits, Decreased awareness of safety Problem Solving: Requires verbal cues, Requires tactile cues General Comments: TED hose donned beginning of session   Physical Exam: Blood pressure (!) 162/58, pulse (!) 42, temperature (!) 97.5 F (36.4 C), temperature source Oral, resp. rate (!) 23, height 5\' 8"  (1.727 m), weight 104.3 kg, SpO2 93 %. Physical Exam Vitals and nursing note reviewed. Exam conducted with a chaperone present.  Constitutional:      Appearance: Normal appearance.     Comments: Pt sitting up at EOB after walking to  bed with assistance from transport - elderly male appropriate, NAD  HENT:     Head: Normocephalic.     Comments: Craniotomy site with staples intact on L side of head- ~ 20 staples Very mild R facial droop; tongue midline    Right Ear: External ear normal.     Left Ear: External ear normal.     Nose: Nose normal. No congestion.     Mouth/Throat:     Mouth: Mucous membranes are dry.     Pharynx: Oropharynx is clear. No oropharyngeal exudate.  Eyes:     General:        Right eye: No discharge.        Left eye: No discharge.     Extraocular  Movements: Extraocular movements intact.  Cardiovascular:     Rate and Rhythm: Regular rhythm.     Heart sounds: Normal heart sounds. No murmur heard.   No gallop.  Pulmonary:     Comments: CTA B/L- no W/R/R- good air movement   Abdominal:     Comments: Soft, NT, ND, (+)BS - normoactive  Musculoskeletal:     Cervical back: Normal range of motion. No rigidity.     Comments: RUE 4-/5; LUE 5-/5 RLE 4/5 in HF/KE/DF and PF LLE 5-/5  Skin:    Comments: Intact-  no skin breakdown on backside or heels 1-2+ LE edema with hemosiderin staining B/L  Ecchymoses on arms B/L  Neurological:     Mental Status: He is oriented to person, place, and time.     Comments: Patient is alert.  No acute distress.  Makes eye contact with examiner.  Provides name and age.  Some delay in processing. Intact to light touch x4 extremities  Psychiatric:     Comments: Flat affect      Lab Results Last 48 Hours        Results for orders placed or performed during the hospital encounter of 02/04/21 (from the past 48 hour(s))  Glucose, capillary     Status: Abnormal    Collection Time: 02/09/21 11:21 AM  Result Value Ref Range    Glucose-Capillary 199 (H) 70 - 99 mg/dL      Comment: Glucose reference range applies only to samples taken after fasting for at least 8 hours.  Glucose, capillary     Status: Abnormal    Collection Time: 02/09/21  4:08 PM  Result Value Ref Range    Glucose-Capillary 264 (H) 70 - 99 mg/dL      Comment: Glucose reference range applies only to samples taken after fasting for at least 8 hours.  Glucose, capillary     Status: Abnormal    Collection Time: 02/09/21  9:08 PM  Result Value Ref Range    Glucose-Capillary 223 (H) 70 - 99 mg/dL      Comment: Glucose reference range applies only to samples taken after fasting for at least 8 hours.    Comment 1 Notify RN      Comment 2 Document in Chart    CBC     Status: Abnormal    Collection Time: 02/10/21  4:43 AM  Result Value Ref Range     WBC 7.0 4.0 - 10.5 K/uL    RBC 4.06 (L) 4.22 - 5.81 MIL/uL    Hemoglobin 12.1 (L) 13.0 - 17.0 g/dL    HCT 37.4 (L) 39.0 - 52.0 %    MCV 92.1 80.0 - 100.0 fL    MCH 29.8 26.0 - 34.0 pg    MCHC 32.4 30.0 - 36.0 g/dL    RDW 13.2 11.5 - 15.5 %    Platelets 234 150 - 400 K/uL    nRBC 0.0 0.0 - 0.2 %      Comment: Performed at [facility], [address] [place], [place] [address]  Basic metabolic panel     Status: Abnormal    Collection Time: 02/10/21  4:43 AM  Result Value Ref Range    Sodium 137 135 - 145 mmol/L    Potassium 3.6 3.5 - 5.1 mmol/L    Chloride 100 98 - 111 mmol/L    CO2 30 22 - 32 mmol/L    Glucose, Bld 153 (H) 70 - 99 mg/dL      Comment: Glucose reference range applies only to samples taken after fasting for at least 8 hours.    BUN 47 (H) 8 - 23 mg/dL    Creatinine, Ser 1.91 (H) 0.61 - 1.24 mg/dL    Calcium 8.5 (L) 8.9 - 10.3 mg/dL    GFR, Estimated 35 (L) >60 mL/min      Comment: (NOTE) Calculated using the CKD-EPI Creatinine Equation (2021)      Anion gap 7 5 - 15      Comment: Performed at [facility], [address] [place], [place]  [address]  Glucose, capillary     Status: Abnormal    Collection Time: 02/10/21  7:37 AM  Result Value Ref Range    Glucose-Capillary 153 (H) 70 - 99 mg/dL      Comment: Glucose reference range applies only to samples taken after fasting for at least 8 hours.  Glucose, capillary     Status: Abnormal    Collection Time: 02/10/21 11:29 AM  Result Value Ref Range    Glucose-Capillary 177 (H) 70 - 99 mg/dL      Comment: Glucose reference range applies only to samples taken after fasting for at least 8 hours.  Glucose, capillary     Status: Abnormal    Collection Time: 02/10/21  4:44 PM  Result Value Ref Range    Glucose-Capillary 153 (H) 70 - 99 mg/dL      Comment: Glucose reference range applies only to samples taken after fasting for at least 8 hours.  Glucose, capillary     Status: Abnormal     Collection Time: 02/10/21  8:51 PM  Result Value Ref Range    Glucose-Capillary 190 (H) 70 - 99 mg/dL      Comment: Glucose reference range applies only to samples taken after fasting for at least 8 hours.    Comment 1 Notify RN      Comment 2 Document in Chart    Glucose, capillary     Status: Abnormal    Collection Time: 02/11/21  7:44 AM  Result Value Ref Range    Glucose-Capillary 155 (H) 70 - 99 mg/dL      Comment: Glucose reference range applies only to samples taken after fasting for at least 8 hours.       Imaging Results (Last 48 hours)  CT HEAD WO CONTRAST (5MM)   Result Date: 02/10/2021 CLINICAL DATA:  Follow-up subdural hematoma. EXAM: CT HEAD WITHOUT CONTRAST TECHNIQUE: Contiguous axial images were obtained from the base of the skull through the vertex without intravenous contrast. COMPARISON:  Head CT 02/05/2021 FINDINGS: Brain: Sequelae of left-sided craniotomy for subdural hematoma evacuation are again identified. Residual mixed density collection over the left cerebral convexity has an unchanged maximal thickness of 1.3 cm. A small amount of gas within the collection has decreased. A small amount of low-density extra-axial fluid immediately subjacent to the craniotomy flap measures up to 7 mm in thickness and has increased. Mild mass effect on the left cerebral hemisphere is unchanged including trace rightward midline shift. No new intracranial hemorrhage, acute infarct, or mass is identified. There is mild cerebral atrophy. Hypodensities in the cerebral white matter bilaterally are unchanged and nonspecific but compatible with mild chronic small vessel ischemic disease. Vascular: Calcified atherosclerosis at the skull base. No hyperdense vessel. Skull: Left frontoparietal craniotomy with skin staples remaining in place. Interval removal of the left scalp drain. Sinuses/Orbits: Unremarkable included orbits. Visualized paranasal sinuses and mastoid air cells are clear. Other: None.  IMPRESSION: 1. Residual mixed density left-sided subdural hematoma with minimal interval changes as above. Unchanged mass effect and trace rightward midline shift. 2. No evidence of new intracranial hemorrhage. 3. Mild chronic small vessel ischemic disease. Electronically Signed   By: Logan Bores M.D.   On: 02/10/2021 12:45             Medical Problem List and Plan: 1.  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  Status post craniotomy evacuation 02/05/2021             -  patient may not shower unless covers hair/head             -ELOS/Goals: 12-14 days- supervision 2.  Antithrombotics: -DVT/anticoagulation:  Pharmaceutical: Other (comment) Eliquis             -antiplatelet therapy: N/A 3. Pain Management: Fioricet, oxycodone 4. Mood: Provide emotional support             -antipsychotic agents: N/A 5. Neuropsych: This patient is capable of making decisions on his own behalf. 6. Skin/Wound Care: Routine skin checks 7. Fluids/Electrolytes/Nutrition: Routine in and out with follow-up chemistry 8.  Diabetes mellitus peripheral neuropathy.  Hemoglobin A1c 7.3.  NovoLog 3 units 3 times daily with meals, Semglee 10 units daily.  Check blood sugars before meals and at bedtime 9.  CKD stage IV.  Continue torsemide for now, Zaroxolyn on hold. 10.  Obesity.  BMI 34.97.  Dietary follow-up 11.  Chronic atrial fibrillation.  Cardiac rate controlled.  Eliquis resumed 02/11/2021 12.  Hypertension.  Imdur 30 mg daily, Demadex 60 mg daily 13.  Chronic diastolic congestive heart failure.  Continue Demadex 60 mg daily.  Monitor for any signs of fluid overload 14.  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  Continue eyedrops. 15.  CAD with history of stenting.  Continue Imdur.  No chest pain or shortness of breath       I have personally performed a face to face diagnostic evaluation of this patient and formulated the key components of the plan.   Additionally, I have personally reviewed laboratory data, imaging studies, as well as relevant notes and concur with the physician assistant's documentation above.   The patient's status has not changed from the original H&P.  Any changes in documentation from the acute care chart have been noted above.     Lavon Paganini Angiulli, PA-C 02/11/2021

## 2021-02-11 NOTE — Progress Notes (Signed)
[facility] Progress Note  Patient: Kyle Roberson    ZDG:[id number]  DOA: 02/04/2021    Date of Service: the patient was seen and examined on 02/11/2021  Brief hospital course: 11/15 presented to the hospital.  Craniotomy performed. 11/17 hospitalist consult. 11/19: Waiting for floor bed here and CIR placement for disposition 11/20: Blood sugar trending minimally up.  Added NovoLog 3 units 3 times daily with meals.  Heart rate running slow Norvasc stopped.   11/22 Started on apixban 2.5 mg.  Assessment and Plan: * Subdural hematoma SP left craniotomy for evacuation of subdural hematoma.  Drain removed.  Management per neurosurgery.  CM placement at discharge.  Repeat CT scan shows no acute bleeding.  Still has midline shift as well as  Mass-effect.  Started on apixaban by neurosurgery   Chronic heart failure with preserved ejection fraction (HFpEF) (HCC) Volume stable.  Trace pitting edema present.  Patient was on Zaroxolyn and torsemide. Continue torsemide 60 mg daily.  Last echocardiogram shows 55 to 60% EF.   Essential hypertension Blood pressure is improving on current regimen. Continue hydralazine, torsemide.Continue Imdur. Norvasc was stopped although I do not think the patient actually has Norvasc induced bradycardia. Cardiology consulted.  Outpatient, there was discussion about consideration of pacemaker.   Type 2 diabetes mellitus with stage 4 chronic kidney disease (HCC) uncontrolled with hyperglycemia, on long-term insulin  Currently on sliding-scale insulin.  Continue Semglee 10 units daily. A1c 7.3 Home DM Meds:  Glipizide 10 mg BID NPH Insulin 10 units   Chronic a-fib (HCC) Remains in A. fib but rate controlled. Actually has bradycardia apixaban resumed on 02/11/2021   CKD (chronic kidney disease) stage 4, GFR 15-29 ml/min (HCC) Renal function stable.  Monitor.  Avoid nephrotoxic medication Continue torsemide but hold zaroxolyn   Obesity (BMI  30-39.9) Counseled for weight loss Body mass index is 34.97 kg/m.   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    Benign non-nodular prostatic hyperplasia with lower urinary tract symptoms Urine somewhat cloudy  need to encourage oral hydration, Monitor for retention.    Body mass index is 34.97 kg/m.        Subjective: in good spirit. no acute complain, no dysuria, no nausea, no fever, no abdominal pain or flank pain. Working with therapy.   Objective: Vital signs were reviewed and unremarkable except for: Blood pressure: elevated   General: Appear in mild distress, no Rash; Oral Mucosa Clear, moist. no Abnormal Neck Mass Or lumps, Conjunctiva normal  Cardiovascular: S1 and S2 Present, no Murmur, Respiratory: good respiratory effort, Bilateral Air entry present and CTA, no Crackles, no wheezes Abdomen: Bowel Sound present, Soft and no tenderness Extremities: no Pedal edema Neurology: alert and oriented to time, place, and person affect appropriate. no new focal deficit Gait not checked due to patient safety concerns    Data Reviewed: There are no new results to review at this time.  Disposition:  Status is: Inpatient  Remains inpatient appropriate because: unsafe discharge awaiting CIR  Family Communication: no family at bedside  DVT Prophylaxis: apixaban (ELIQUIS) tablet 2.5 mg Start: 02/11/21 1000 SCDs Start: 02/04/21 1843 apixaban (ELIQUIS) tablet 2.5 mg   Time spent: 35 minutes.   Author: Berle Mull  02/11/2021 10:15 AM  To reach On-call, see care teams to locate the attending and reach out via [email or web address]. Between 7PM-7AM, please contact night-coverage If you still have difficulty reaching the attending provider, please page the [facility] (Director on Call) for [facility] on  amion for assistance.  

## 2021-02-11 NOTE — Progress Notes (Signed)
   Progress Note  History: [name] is s/p left craniotomy for evacuation of subdural hematoma  POD#7: NAEO.   POD#6: Mild headache over the weekend without changes in neurologic exam. Continues to feel improvement in his dysarthria.  POD#5: Doing well neurologically.  Had some bradycardia overnight  POD#4: Doing well - working with PTOT  POD#3: drain removed yesterday. NAEO  POD#2: Some incisional pain overnight.  POD#1: NAEO. Pt reports good pain control.   Physical Exam: Vitals:   02/11/21 0300 02/11/21 0400  BP: (!) 154/55 (!) 162/58  Pulse: (!) 48 (!) 42  Resp: (!) 26 (!) 23  Temp: 97.9 F (36.6 C) (!) 97.5 F (36.4 C)  SpO2: (!) 86% 93%    AA Ox3; mild dysarthria  CNI except left eye blindness (baseline) Strength:5/5 throughout  No pronator drift Incision c/d/I with staples in place  Data:  Recent Labs  Lab 02/05/21 0422 02/10/21 0443  NA 140 137  K 4.0 3.6  CL 107 100  CO2 25 30  BUN 36* 47*  CREATININE 1.90* 1.91*  GLUCOSE 246* 153*  CALCIUM 8.4* 8.5*    No results for input(s): AST, ALT, ALKPHOS in the last 168 hours.  Invalid input(s): TBILI   Recent Labs  Lab 02/05/21 0422 02/10/21 0443  WBC 6.5 7.0  HGB 12.6* 12.1*  HCT 38.4* 37.4*  PLT 219 234    No results for input(s): APTT, INR in the last 168 hours.       Other tests/results:  CT head 02/05/21 IMPRESSION: 1. Interval left-sided craniotomy with surgical drain in the scalp and underlying reduction in the left cerebral convexity mixed-density subdural hematoma. 2. Scattered air pockets in the collection with decreased left-to-right midline shift now 3 mm. 3. There is decreased mass effect on the left cerebral hemisphere with decreased gyral crowding. There is no downward mass effect. 4. Atrophy and small vessel changes.   Electronically Signed   By: Telford Nab M.D.   On: 02/05/2021 06:03  Assessment/Plan:  Kyle Roberson is a 80 y.o presenting with  chronic SDH with midline shift s/p left craniotomy for evacuation.   - mobilize - pain control - started SSI - PTOT; no activity restrictions  - drain removed 02/06/21 - hospitalist consulted for medical management - appreciate help - cardiology consulted- appreciate input. - on floor status but remains in ICU due to bed availability  - Will obtain head CT on 02/11/21 which appears stable.  Restarted patient's Eliquis today after discussing with Dr. Lacinda Axon. -Discontinued Keppra  - dispo planning underway; awaiting bed availability at rehab.  Cooper Render PA-C Department of Neurosurgery

## 2021-02-11 NOTE — Progress Notes (Signed)
Progress Note  Patient Name: Kyle Roberson Date of Encounter: 02/11/2021  [facility] Cardiologist: Rockey Situ  Subjective   No chest pain, shortness of breath, lightheadedness, or headache.  Reports mild ankle edema.  Planning for transfer to acute inpatient rehab today.  Inpatient Medications    Scheduled Meds:  apixaban  2.5 mg Oral BID   Chlorhexidine Gluconate Cloth  6 each Topical Daily   dorzolamide-timolol  1 drop Left Eye BID   hydrALAZINE  100 mg Oral TID   insulin aspart  0-9 Units Subcutaneous TID WC   insulin aspart  3 Units Subcutaneous TID WC   insulin glargine-yfgn  10 Units Subcutaneous Daily   isosorbide mononitrate  30 mg Oral Daily   senna  1 tablet Oral BID   torsemide  60 mg Oral Daily   Continuous Infusions:  PRN Meds: acetaminophen **OR** acetaminophen, bisacodyl, butalbital-acetaminophen-caffeine, diphenhydrAMINE, hydrALAZINE, hydrocortisone cream, naLOXone (NARCAN)  injection, ondansetron **OR** ondansetron (ZOFRAN) IV, oxyCODONE, polyethylene glycol, promethazine, sodium phosphate   Vital Signs    Vitals:   02/11/21 0100 02/11/21 0200 02/11/21 0300 02/11/21 0400  BP: (!) 172/66 (!) 150/66 (!) 154/55 (!) 162/58  Pulse: (!) 47 (!) 44 (!) 48 (!) 42  Resp: (!) 23 20 (!) 26 (!) 23  Temp:   97.9 F (36.6 C) (!) 97.5 F (36.4 C)  TempSrc:   Oral Oral  SpO2: 100% 99% (!) 86% 93%  Weight:      Height:        Intake/Output Summary (Last 24 hours) at 02/11/2021 1224 Last data filed at 02/11/2021 0400 Gross per 24 hour  Intake 240 ml  Output 700 ml  Net -460 ml   Last 3 Weights 02/04/2021 02/02/2021 01/28/2021  Weight (lbs) 230 lb 230 lb 230 lb  Weight (kg) 104.327 kg 104.327 kg 104.327 kg      Telemetry    Atrial fibrillation with PVC's verus aberrancy, ventricular rates 35-90 bpm - Personally Reviewed  ECG    No new tracing. Physical Exam   GEN: No acute distress.  Accompanied by granddaughter Neck: No JVD Cardiac:  Bradycardic and irregularly irregular with 2/6 systolic murmur Respiratory: Diminished breath sounds at both lung bases GI: Soft, nontender, non-distended  MS: Trace ankle edema; No deformity. Neuro:  Nonfocal  Psych: Normal affect   Labs    High Sensitivity Troponin:  No results for input(s): TROPONINIHS in the last 720 hours.   Chemistry Recent Labs  Lab 02/05/21 0422 02/10/21 0443  NA 140 137  K 4.0 3.6  CL 107 100  CO2 25 30  GLUCOSE 246* 153*  BUN 36* 47*  CREATININE 1.90* 1.91*  CALCIUM 8.4* 8.5*  GFRNONAA 35* 35*  ANIONGAP 8 7    Lipids No results for input(s): CHOL, TRIG, HDL, LABVLDL, LDLCALC, CHOLHDL in the last 168 hours.  Hematology Recent Labs  Lab 02/05/21 0422 02/10/21 0443  WBC 6.5 7.0  RBC 4.13* 4.06*  HGB 12.6* 12.1*  HCT 38.4* 37.4*  MCV 93.0 92.1  MCH 30.5 29.8  MCHC 32.8 32.4  RDW 13.4 13.2  PLT 219 234   Thyroid No results for input(s): TSH, FREET4 in the last 168 hours.  BNPNo results for input(s): BNP, PROBNP in the last 168 hours.  DDimer No results for input(s): DDIMER in the last 168 hours.   Radiology    CT HEAD WO CONTRAST (5MM)  Result Date: 02/10/2021 CLINICAL DATA:  Follow-up subdural hematoma. EXAM: CT HEAD WITHOUT CONTRAST TECHNIQUE: Contiguous axial images  were obtained from the base of the skull through the vertex without intravenous contrast. COMPARISON:  Head CT 02/05/2021 FINDINGS: Brain: Sequelae of left-sided craniotomy for subdural hematoma evacuation are again identified. Residual mixed density collection over the left cerebral convexity has an unchanged maximal thickness of 1.3 cm. A small amount of gas within the collection has decreased. A small amount of low-density extra-axial fluid immediately subjacent to the craniotomy flap measures up to 7 mm in thickness and has increased. Mild mass effect on the left cerebral hemisphere is unchanged including trace rightward midline shift. No new intracranial hemorrhage, acute  infarct, or mass is identified. There is mild cerebral atrophy. Hypodensities in the cerebral white matter bilaterally are unchanged and nonspecific but compatible with mild chronic small vessel ischemic disease. Vascular: Calcified atherosclerosis at the skull base. No hyperdense vessel. Skull: Left frontoparietal craniotomy with skin staples remaining in place. Interval removal of the left scalp drain. Sinuses/Orbits: Unremarkable included orbits. Visualized paranasal sinuses and mastoid air cells are clear. Other: None. IMPRESSION: 1. Residual mixed density left-sided subdural hematoma with minimal interval changes as above. Unchanged mass effect and trace rightward midline shift. 2. No evidence of new intracranial hemorrhage. 3. Mild chronic small vessel ischemic disease. Electronically Signed   By: Logan Bores M.D.   On: 02/10/2021 12:45    Cardiac Studies   TTE (05/08/2019):  1. Left ventricular ejection fraction, by estimation, is 55 to 60%. The  left ventricle has normal function. The left ventricle has no regional  wall motion abnormalities. There is mild left ventricular hypertrophy.  Left ventricular diastolic parameters  are indeterminate.   2. Right ventricular systolic function is normal. The right ventricular  size is normal. There is moderately elevated pulmonary artery systolic  pressure. The estimated right ventricular systolic pressure is 05.3 mmHg.   3. Left atrial size was moderately dilated.   4. The mitral valve is normal in structure and function. Mild to moderate  mitral valve regurgitation. No evidence of mitral stenosis.   5. The aortic valve is normal in structure and function. Aortic valve  regurgitation is mild. Mild aortic valve sclerosis is present, with no  evidence of aortic valve stenosis.   6. Moderately dilated pulmonary artery.   7. The inferior vena cava is dilated in size with <50% respiratory  variability, suggesting right atrial pressure of 15 mmHg.    Patient Profile     80 y.o. male with h/o CAD, permanent Afib, HTN, HLD, DM2, CKD, GERD, hyperparathyroidism, h/o falls, chronic lower extremity edema, bradycardia as outpatient with rates into the 40s, recovering from craniotomy for left subdural hematoma evacuation following a fall who is being seen for bradycardia.  Assessment & Plan    Permanent atrial fibrillation with slow ventricular response: HR drop to the 30's (especially overnight) but seem to respond to stimulation.  No symptoms of bradycardia reported.  No indication for pacemaker placement at this time. - Continue to avoid AV-nodal blocking agents. - Back on low-dose apixaban in the setting of CHADSVASC score of 5. - Could consider referral for Watchman evaluation as an outpatient.  Hypertension: BP has been labile.  Given bradycardia and risk for symptomatic hypotension leading to further falls, I would favor a degree of permissive hypertension. - Continue hydralazine and isosorbide mononitrate at current doses. - Could rechallenge with low-dose amlodipine if necessary (previously discontinued out of concern that it was causing leg edema - was on 10 mg daily at one point).  Alternatively doxazosin would be  another issue though I worry about risk of orthostatic hypotension with this. - Avoid ACEI/ARB for now given CKD.  Subdural hematoma: Apixaban restarted. - Ongoing management per neurosurgery and PMR.  [facility] will sign off.   Medication Recommendations:  See above Other recommendations (labs, testing, etc):  None Follow up as an outpatient:  Follow-up with [name] in 3-4 weeks.  For questions or updates, please contact [place] Please consult [email or web address] for contact info under [facility].     Signed, Nelva Bush, MD  02/11/2021, 12:24 PM

## 2021-02-11 NOTE — Progress Notes (Addendum)
Occupational Therapy Treatment Patient Details Name: Kyle Roberson MRN: [id number] DOB: 1940/11/22 Today's Date: 02/11/2021   History of present illness Kentley Blyden is an 80 y.o. male with a PMH significant for CAD, diabetes, hypertension, GERD, hyperlipidemia, chronic A. fib.  CT head shows acute subdural bleeding.  Patient s/p left craniotomy for evacuation of subdural hematoma 02/05/21.   OT comments  Mr. Carswell seen for OT treatment on this date. Upon arrival to room pt awake/alert with girlfriend feeding him breakfast. Pt agreeable to tx.   Pt CGA for functional mobility, furniture walking to reach sink. When walking LOB noted w/o UE support, stagger steps to used to self-correct. Pt SBA for grooming, standing w/ intermittent UE support, sinkside ~ 67min. Pt opened toothpaste using non- dominant L hand and used dominant R hand to stabilize, requiring VCs to incorportate R hand. Pt grips toothbrush functionally using RUE to initiate toothbrushing, ultimately needs L hand for thoroughness. Upon arrival to room, pt completing self-drinking w/ L hand, intermittently used R hand to wipe face. Pt couldn't locate toothbrush with visual scanning, could only locate object by turning head.  Pt making good progress toward goals. Pt continues to benefit from skilled OT services to maximize return to PLOF and minimize risk of future falls, injury, caregiver burden, and readmission. Will continue to follow POC. Discharge recommendation remains appropriate.     Recommendations for follow up therapy are one component of a multi-disciplinary discharge planning process, led by the attending physician.  Recommendations may be updated based on patient status, additional functional criteria and insurance authorization.    Follow Up Recommendations  Acute inpatient rehab (3hours/day)    Assistance Recommended at Discharge Frequent or constant Supervision/Assistance  Equipment Recommendations  Other  (comment) (defer to next venue of care)    Recommendations for Other Services      Precautions / Restrictions Precautions Precautions: Fall Precaution Comments: Maintain BP <160; HOB >30 degrees Restrictions Weight Bearing Restrictions: No       Mobility Bed Mobility Overal bed mobility: Needs Assistance Bed Mobility: Supine to Sit     Supine to sit: Supervision     General bed mobility comments: Pt left sitting in chair    Transfers Overall transfer level: Needs assistance Equipment used: None Transfers: Sit to/from Stand Sit to Stand: Min guard           General transfer comment:      Balance Overall balance assessment: Needs assistance Sitting-balance support: No upper extremity supported;Feet supported Sitting balance-Leahy Scale: Good Sitting balance -    Standing balance support: No upper extremity supported;During functional activity Standing balance-Leahy Scale: Fair Standing balance comment:                            ADL either performed or assessed with clinical judgement   ADL Overall ADL's : Needs assistance/impaired                                       General ADL Comments: Pt CGA for functional mobility, furniture walking When walking LOB noted w/o UE support, stagger steps to used to self- correct. Pt SBA for grooming-toothbrushing, standing w/ intermittent UE support, sinkside ~ 69min. Pt opened toothpaste using non- dominant L hand and used dominant R hand to stabilize, needed VCs to incorportate R hand. Pt grips toothbrush functionally using RUE to initiate  toothbrushing, ultimately needs L hand for thoroughness. Upon arrival to room, pts girlfriend feeding him a banana, pt completed self-drinking w/L hand, intermittently used R hand to wipe face. Pt couldn't locate toothbrush with visual scanning, could only locate by turning head.    Extremity/Trunk Assessment               Cognition Arousal/Alertness:  Awake/alert Behavior During Therapy: WFL for tasks assessed/performed Overall Cognitive Status: Impaired/Different from baseline Area of Impairment: Following commands;Problem solving;Safety/judgement                       Following Commands: Follows multi-step commands inconsistently;Follows multi-step commands with increased time Safety/Judgement: Decreased awareness of safety;Decreased awareness of deficits   Problem Solving: Requires verbal cues;Requires tactile cues            Exercises Exercises: Other exercises Other Exercises Other Exercises: Pt educ re: d/c recs falls prevention, OT role Other Exercises: Sup>sit, ambulate to sink for toothbrushing, pt left in chair   Shoulder Instructions       General Comments      Pertinent Vitals/ Pain       Pain Assessment: Faces Pain Score: 2  Pain Location: general body aches Pain Descriptors / Indicators: Aching;Discomfort Pain Intervention(s): Monitored during session;Repositioned  Home Living                                          Prior Functioning/Environment              Frequency  Min 5X/week        Progress Toward Goals  OT Goals(current goals can now be found in the care plan section)  Progress towards OT goals: Progressing toward goals  Acute Rehab OT Goals Patient Stated Goal: to go home OT Goal Formulation: With patient Time For Goal Achievement: 02/20/21 Potential to Achieve Goals: Good ADL Goals Pt Will Perform Grooming: with min assist;standing Pt Will Transfer to Toilet: with modified independence;ambulating;regular height toilet Pt/caregiver will Perform Home Exercise Program: Increased ROM;Increased strength;With theraputty;Both right and left upper extremity;Independently  Plan Discharge plan remains appropriate;Frequency remains appropriate    Co-evaluation                 AM-PAC OT "6 Clicks" Daily Activity     Outcome Measure   Help from  another person eating meals?: A Little Help from another person taking care of personal grooming?: A Little Help from another person toileting, which includes using toliet, bedpan, or urinal?: A Lot Help from another person bathing (including washing, rinsing, drying)?: A Lot Help from another person to put on and taking off regular upper body clothing?: A Little Help from another person to put on and taking off regular lower body clothing?: A Lot 6 Click Score: 15    End of Session    OT Visit Diagnosis: Unsteadiness on feet (R26.81);Other abnormalities of gait and mobility (R26.89);Muscle weakness (generalized) (M62.81)   Activity Tolerance Patient tolerated treatment well   Patient Left in chair;with call bell/phone within reach;with nursing/sitter in room   Nurse Communication          Time: 4562-5638 OT Time Calculation (min): 25 min  Charges: OT General Charges $OT Visit: 1 Visit OT Treatments $Self Care/Home Management : 23-37 mins  Nino Glow, Markus Daft 02/11/2021, 1:17 PM

## 2021-02-11 NOTE — Progress Notes (Signed)
Inpatient Rehab Admissions Coordinator:    I have insurance approval and a bed available for pt to admit to CIR today. Cooper Render, PA-C, in agreement.  Will let pt/family and TOC team know.   Shann Medal, PT, DPT Admissions Coordinator [phone] 02/11/21  10:25 AM

## 2021-02-11 NOTE — Progress Notes (Signed)
Inpatient Rehabilitation Admission Medication Review by a Pharmacist  A complete drug regimen review was completed for this patient to identify any potential clinically significant medication issues.  High Risk Drug Classes Is patient taking? Indication by Medication  Antipsychotic No   Anticoagulant Yes Apixaban- Chronic A. fib  Antibiotic No   Opioid Yes OxyIR- pain  Antiplatelet No   Hypoglycemics/insulin Yes Novolog- T2DM  Vasoactive Medication Yes Imdur, apresoline, Demadex- Hypertension  Chemotherapy No   Other No      Type of Medication Issue Identified Description of Issue Recommendation(s)  Drug Interaction(s) (clinically significant)     Duplicate Therapy     Allergy     No Medication Administration End Date     Incorrect Dose     Additional Drug Therapy Needed     Significant med changes from prior encounter (inform family/care partners about these prior to discharge).    Other  PTA meds- Glipizide, crestor, metolazone Continue meds if and when clinically appropriate     Clinically significant medication issues were identified that warrant physician communication and completion of prescribed/recommended actions by midnight of the next day:  No  Time spent performing this drug regimen review (minutes):  20   Tim Corriher BS, PharmD, BCPS Clinical Pharmacist  02/11/2021 2:04 PM

## 2021-02-11 NOTE — H&P (Signed)
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  Per chart review patient lives with significant other.  Modified independent with a quad cane.  1 level home with ramped entrance.  Presented to [facility] 02/04/2021 with bouts of dizziness and difficulty with hand coordination over a 1 day period as well as expressive aphasia with increasing right side weakness.  Patient does endorse a fall in August 2022 cranial CT scan at that time showed no acute intracranial process..  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  No other new acute intracranial abnormality.  Admission chemistries unremarkable aside glucose 261 BUN 39 creatinine 2.04, hemoglobin A1c 7.3.  MRI 02/03/2021 showed nonacute subdural hematoma on the left with cortical mass-effect and 5 mm midline shift.  MRA no emergent finding of proximal flow-limiting stenosis.  Follow-up neurosurgery due to patient's progressive changes underwent left craniotomy evacuation of subdural hematoma 02/05/2021 per Dr. Lacinda Axon.  Latest follow-up CT scan showed no evidence of new intracranial hemorrhage.  Patient was cleared to resume chronic Eliquis 02/11/2021.  Tolerating a regular consistency diet.  Therapy evaluations completed due to patient's right side weakness and aphasia was admitted for a comprehensive rehab program.   Pt reports having occ HA's, but LBM yesterday and using purewick for bladder;  hates  urinal.    Review of Systems  Constitutional:  Negative for chills and fever.  HENT:  Negative for hearing loss.   Eyes:  Negative for blurred vision and double vision.       Chronic vision loss due to central retinal vein occlusion  Respiratory:  Negative for cough and shortness of breath.   Cardiovascular:  Positive for palpitations. Negative for chest pain and leg swelling.  Gastrointestinal:  Positive for constipation. Negative for heartburn, nausea and vomiting.       GERD  Genitourinary:  Negative for dysuria, flank pain and hematuria.  Musculoskeletal:  Positive for myalgias.       Recent fall August 2022  Skin:  Negative for rash.  Neurological:  Positive for speech change and weakness.  All other systems reviewed and are negative. Past Medical History:  Diagnosis Date   AKI (acute kidney injury) ([place]) 07/06/2018   CAD (coronary artery disease)    Cellulitis of lower extremity 05/31/2019   Diabetes (HCC)    GERD (gastroesophageal reflux disease)    HLD (hyperlipidemia)    HTN (hypertension)    Past Surgical History:  Procedure Laterality Date   BLEPHAROPLASTY     CARDIAC CATHETERIZATION     CORONARY STENT INTERVENTION     CRANIOTOMY Left 02/04/2021   Procedure: CRANIOTOMY FOR LEFT SUBDURAL  HEMATOMA EVACUATION;  Surgeon: Deetta Perla, MD;  Location: [facility] ORS;  Service: Neurosurgery;  Laterality: Left;   CYSTOSCOPY     HERNIA REPAIR     Family History  Problem Relation Age of Onset   Pancreatic cancer Mother    CAD Father    Diabetes Brother    Social History:  reports that he has quit smoking. He has never used smokeless tobacco.  He reports that he does not currently use alcohol after a past usage of about 2.0 standard drinks per week. He reports that he does not currently use drugs. Allergies:  Allergies  Allergen Reactions   Atorvastatin Hives and Itching   Simvastatin Hives and Itching   Medications Prior to Admission  Medication Sig Dispense Refill    Ascorbic Acid (VITAMIN C PO) Take 1 tablet by mouth in the morning.     Cholecalciferol (VITAMIN D3 PO) Take 1 tablet by mouth in the morning.     Coenzyme Q10-Vitamin E (QUNOL ULTRA COQ10 PO) Take 1 capsule by mouth in the morning.     dorzolamide-timolol (COSOPT) 22.3-6.8 MG/ML ophthalmic solution Place 1 drop into the left eye 2 (two) times daily.     Flaxseed, Linseed, (FLAXSEED OIL PO) Take 1 capsule by mouth in the morning.     glipiZIDE (GLUCOTROL XL) 10 MG 24 hr tablet TAKE 1 TABLET(10 MG) BY MOUTH TWICE DAILY 180 tablet 0   hydrALAZINE (APRESOLINE) 100 MG tablet Take 1 tablet (100 mg total) by mouth 3 (three) times daily. 270 tablet 3   insulin NPH Human (NOVOLIN N) 100 UNIT/ML injection Inject 10-15 Units into the skin daily. Take 10-15 units twice a day after breakfast and before bedtime per Sliding Scale (BS>250: 15 units, BS= 200-250:10 units)     levETIRAcetam (KEPPRA) 500 MG tablet Take 1 tablet (500 mg total) by mouth 2 (two) times daily for 7 days. 14 tablet 0   Multiple Vitamin (MULTIVITAMIN WITH MINERALS) TABS tablet Take 1 tablet by mouth in the morning.     nitroGLYCERIN (NITROLINGUAL) 0.4 MG/SPRAY spray Place 1 spray under the tongue as directed. 12 g 1   Omega-3 Fatty Acids (FISH OIL PO) Take 1 capsule by mouth in the morning.     rosuvastatin (CRESTOR) 5 MG tablet TAKE 1 TABLET BY MOUTH 3 TIMES A WEEK 30 tablet 0   torsemide (DEMADEX) 20 MG tablet Take 3 tablets (60 mg total) by mouth daily. 360 tablet 3   Turmeric (QC TUMERIC COMPLEX PO) Take 1 capsule by mouth in the morning. Qunol Turmeric Curcumin     ELIQUIS 2.5 MG TABS tablet TAKE 1 TABLET(2.5 MG) BY MOUTH TWICE DAILY 180 tablet 1   metolazone (ZAROXOLYN) 2.5 MG tablet TAKE 1 TABLET BY MOUTH EVERY DAY AS NEEDED FOR WEIGH>246 90 tablet 1   potassium chloride SA (KLOR-CON) 20 MEQ tablet Take 1 tablet (20 mEq total) by mouth daily as needed. (Patient taking differently: Take 20 mEq by mouth daily as needed (with  metalozone).) 90 tablet 3    Drug Regimen Review Drug regimen was reviewed and remains appropriate with no significant issues identified  Home: Home Living Family/patient expects to be discharged to:: Private residence Living Arrangements: Spouse/significant other Available Help at Discharge: Family, Available PRN/intermittently Type of Home: House Home Access: Ramped entrance Home Layout: One level, Laundry or work area in basement (no essential needs in basement area) Home Equipment: Kasandra Knudsen - quad   Functional History: Prior Function Prior Level of Function : Independent/Modified Independent, History of Falls (last six months) Mobility Comments: Indep without assist device at baseline, recent use of quad cane due to worsening balance (since fall).  Does endorse single fall in August, 2022. ADLs Comments: Indep with ADLs, enjoys outdoor work/gardening  Functional Status:  Mobility: Bed Mobility Overal bed mobility: Needs Assistance Bed Mobility: Supine to Sit Supine to sit: Supervision General bed mobility comments: vc's for technique; increased effort for  pt to perform on own Transfers Overall transfer level: Needs assistance Equipment used: Rolling walker (2 wheels) Transfers: Sit to/from Stand Sit to Stand: Min assist Bed to/from chair/wheelchair/BSC transfer type:: Stand pivot Stand pivot transfers: Min assist (with RW, stand pivot bed>recliner) General transfer comment: vc's for UE placement; assist for balance with transfers Ambulation/Gait Ambulation/Gait assistance: Min guard, Min assist Gait Distance (Feet): 120 Feet Assistive device: Rolling walker (2 wheels) Gait Pattern/deviations: Decreased step length - left, Decreased step length - right, Decreased dorsiflexion - right, Decreased dorsiflexion - left, Decreased stride length General Gait Details: partial step through gait pattern; vc's to look up when walking Gait velocity: decreased    ADL: ADL Overall  ADL's : Needs assistance/impaired General ADL Comments: Pt static sitting on EOB for theraputty/ vision exercises~ 10 min. MAX A for perihygiene, standing.  Cognition: Cognition Overall Cognitive Status: No family/caregiver present to determine baseline cognitive functioning Orientation Level: Oriented X4 Cognition Arousal/Alertness: Awake/alert Behavior During Therapy: WFL for tasks assessed/performed Overall Cognitive Status: No family/caregiver present to determine baseline cognitive functioning Area of Impairment: Following commands, Problem solving, Safety/judgement Following Commands: Follows multi-step commands inconsistently, Follows multi-step commands with increased time Safety/Judgement: Decreased awareness of deficits, Decreased awareness of safety Problem Solving: Requires verbal cues, Requires tactile cues General Comments: TED hose donned beginning of session  Physical Exam: Blood pressure (!) 162/58, pulse (!) 42, temperature (!) 97.5 F (36.4 C), temperature source Oral, resp. rate (!) 23, height 5\' 8"  (1.727 m), weight 104.3 kg, SpO2 93 %. Physical Exam Vitals and nursing note reviewed. Exam conducted with a chaperone present.  Constitutional:      Appearance: Normal appearance.     Comments: Pt sitting up at EOB after walking to  bed with assistance from transport - elderly male appropriate, NAD  HENT:     Head: Normocephalic.     Comments: Craniotomy site with staples intact on L side of head- ~ 20 staples Very mild R facial droop; tongue midline    Right Ear: External ear normal.     Left Ear: External ear normal.     Nose: Nose normal. No congestion.     Mouth/Throat:     Mouth: Mucous membranes are dry.     Pharynx: Oropharynx is clear. No oropharyngeal exudate.  Eyes:     General:        Right eye: No discharge.        Left eye: No discharge.     Extraocular Movements: Extraocular movements intact.  Cardiovascular:     Rate and Rhythm: Regular rhythm.      Heart sounds: Normal heart sounds. No murmur heard.   No gallop.  Pulmonary:     Comments: CTA B/L- no W/R/R- good air movement  Abdominal:     Comments: Soft, NT, ND, (+)BS - normoactive  Musculoskeletal:     Cervical back: Normal range of motion. No rigidity.     Comments: RUE 4-/5; LUE 5-/5 RLE 4/5 in HF/KE/DF and PF LLE 5-/5  Skin:    Comments: Intact- no skin breakdown on backside or heels 1-2+ LE edema with hemosiderin staining B/L  Ecchymoses on arms B/L  Neurological:     Mental Status: He is oriented to person, place, and time.     Comments: Patient is alert.  No acute distress.  Makes eye contact with examiner.  Provides name and age.  Some delay in processing. Intact to light touch x4 extremities  Psychiatric:     Comments: Flat  affect    Results for orders placed or performed during the hospital encounter of 02/04/21 (from the past 48 hour(s))  Glucose, capillary     Status: Abnormal   Collection Time: 02/09/21 11:21 AM  Result Value Ref Range   Glucose-Capillary 199 (H) 70 - 99 mg/dL    Comment: Glucose reference range applies only to samples taken after fasting for at least 8 hours.  Glucose, capillary     Status: Abnormal   Collection Time: 02/09/21  4:08 PM  Result Value Ref Range   Glucose-Capillary 264 (H) 70 - 99 mg/dL    Comment: Glucose reference range applies only to samples taken after fasting for at least 8 hours.  Glucose, capillary     Status: Abnormal   Collection Time: 02/09/21  9:08 PM  Result Value Ref Range   Glucose-Capillary 223 (H) 70 - 99 mg/dL    Comment: Glucose reference range applies only to samples taken after fasting for at least 8 hours.   Comment 1 Notify RN    Comment 2 Document in Chart   CBC     Status: Abnormal   Collection Time: 02/10/21  4:43 AM  Result Value Ref Range   WBC 7.0 4.0 - 10.5 K/uL   RBC 4.06 (L) 4.22 - 5.81 MIL/uL   Hemoglobin 12.1 (L) 13.0 - 17.0 g/dL   HCT 37.4 (L) 39.0 - 52.0 %   MCV 92.1 80.0 -  100.0 fL   MCH 29.8 26.0 - 34.0 pg   MCHC 32.4 30.0 - 36.0 g/dL   RDW 13.2 11.5 - 15.5 %   Platelets 234 150 - 400 K/uL   nRBC 0.0 0.0 - 0.2 %    Comment: Performed at [facility], [address], [place], [place] [address]  Basic metabolic panel     Status: Abnormal   Collection Time: 02/10/21  4:43 AM  Result Value Ref Range   Sodium 137 135 - 145 mmol/L   Potassium 3.6 3.5 - 5.1 mmol/L   Chloride 100 98 - 111 mmol/L   CO2 30 22 - 32 mmol/L   Glucose, Bld 153 (H) 70 - 99 mg/dL    Comment: Glucose reference range applies only to samples taken after fasting for at least 8 hours.   BUN 47 (H) 8 - 23 mg/dL   Creatinine, Ser 1.91 (H) 0.61 - 1.24 mg/dL   Calcium 8.5 (L) 8.9 - 10.3 mg/dL   GFR, Estimated 35 (L) >60 mL/min    Comment: (NOTE) Calculated using the CKD-EPI Creatinine Equation (2021)    Anion gap 7 5 - 15    Comment: Performed at [facility], [address] [place], [place] [address]  Glucose, capillary     Status: Abnormal   Collection Time: 02/10/21  7:37 AM  Result Value Ref Range   Glucose-Capillary 153 (H) 70 - 99 mg/dL    Comment: Glucose reference range applies only to samples taken after fasting for at least 8 hours.  Glucose, capillary     Status: Abnormal   Collection Time: 02/10/21 11:29 AM  Result Value Ref Range   Glucose-Capillary 177 (H) 70 - 99 mg/dL    Comment: Glucose reference range applies only to samples taken after fasting for at least 8 hours.  Glucose, capillary     Status: Abnormal   Collection Time: 02/10/21  4:44 PM  Result Value Ref Range   Glucose-Capillary 153 (H) 70 - 99 mg/dL    Comment: Glucose reference range  applies only to samples taken after fasting for at least 8 hours.  Glucose, capillary     Status: Abnormal   Collection Time: 02/10/21  8:51 PM  Result Value Ref Range   Glucose-Capillary 190 (H) 70 - 99 mg/dL    Comment: Glucose reference range applies only to samples taken after fasting for at least 8  hours.   Comment 1 Notify RN    Comment 2 Document in Chart   Glucose, capillary     Status: Abnormal   Collection Time: 02/11/21  7:44 AM  Result Value Ref Range   Glucose-Capillary 155 (H) 70 - 99 mg/dL    Comment: Glucose reference range applies only to samples taken after fasting for at least 8 hours.   CT HEAD WO CONTRAST (5MM)  Result Date: 02/10/2021 CLINICAL DATA:  Follow-up subdural hematoma. EXAM: CT HEAD WITHOUT CONTRAST TECHNIQUE: Contiguous axial images were obtained from the base of the skull through the vertex without intravenous contrast. COMPARISON:  Head CT 02/05/2021 FINDINGS: Brain: Sequelae of left-sided craniotomy for subdural hematoma evacuation are again identified. Residual mixed density collection over the left cerebral convexity has an unchanged maximal thickness of 1.3 cm. A small amount of gas within the collection has decreased. A small amount of low-density extra-axial fluid immediately subjacent to the craniotomy flap measures up to 7 mm in thickness and has increased. Mild mass effect on the left cerebral hemisphere is unchanged including trace rightward midline shift. No new intracranial hemorrhage, acute infarct, or mass is identified. There is mild cerebral atrophy. Hypodensities in the cerebral white matter bilaterally are unchanged and nonspecific but compatible with mild chronic small vessel ischemic disease. Vascular: Calcified atherosclerosis at the skull base. No hyperdense vessel. Skull: Left frontoparietal craniotomy with skin staples remaining in place. Interval removal of the left scalp drain. Sinuses/Orbits: Unremarkable included orbits. Visualized paranasal sinuses and mastoid air cells are clear. Other: None. IMPRESSION: 1. Residual mixed density left-sided subdural hematoma with minimal interval changes as above. Unchanged mass effect and trace rightward midline shift. 2. No evidence of new intracranial hemorrhage. 3. Mild chronic small vessel ischemic  disease. Electronically Signed   By: Logan Bores M.D.   On: 02/10/2021 12:45       Medical Problem List and Plan: 1.  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  Status post craniotomy evacuation 02/05/2021  -patient may not shower unless covers hair/head  -ELOS/Goals: 12-14 days- supervision 2.  Antithrombotics: -DVT/anticoagulation:  Pharmaceutical: Other (comment) Eliquis  -antiplatelet therapy: N/A 3. Pain Management: Fioricet, oxycodone 4. Mood: Provide emotional support  -antipsychotic agents: N/A 5. Neuropsych: This patient is capable of making decisions on his own behalf. 6. Skin/Wound Care: Routine skin checks 7. Fluids/Electrolytes/Nutrition: Routine in and out with follow-up chemistry 8.  Diabetes mellitus peripheral neuropathy.  Hemoglobin A1c 7.3.  NovoLog 3 units 3 times daily with meals, Semglee 10 units daily.  Check blood sugars before meals and at bedtime 9.  CKD stage IV.  Continue torsemide for now, Zaroxolyn on hold. 10.  Obesity.  BMI 34.97.  Dietary follow-up 11.  Chronic atrial fibrillation.  Cardiac rate controlled.  Eliquis resumed 02/11/2021 12.  Hypertension.  Imdur 30 mg daily, Demadex 60 mg daily 13.  Chronic diastolic congestive heart failure.  Continue Demadex 60 mg daily.  Monitor for any signs of fluid overload 14.  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  Continue eyedrops. 15.  CAD with history of stenting.  Continue Imdur.  No chest pain or shortness of breath    I have personally performed a face to face diagnostic evaluation of this patient and formulated the key components of the plan.  Additionally, I have personally reviewed laboratory data, imaging studies, as well as relevant notes and concur with the physician assistant's documentation above.   The patient's status has not changed from the original H&P.  Any changes in documentation from the acute care chart have been noted  above.    Lavon Paganini Angiulli, PA-C 02/11/2021

## 2021-02-11 NOTE — Discharge Instructions (Addendum)
Inpatient Rehab Discharge Instructions  Kyle Roberson Discharge date and time: No discharge date for patient encounter.   Activities/Precautions/ Functional Status: Activity: activity as tolerated Diet: diabetic diet Wound Care: Routine skin checks Functional status:  ___ No restrictions     ___ Walk up steps independently ___ 24/7 supervision/assistance   ___ Walk up steps with assistance ___ Intermittent supervision/assistance  ___ Bathe/dress independently ___ Walk with walker     __x_ Bathe/dress with assistance ___ Walk Independently    ___ Shower independently ___ Walk with assistance    ___ Shower with assistance ___ No alcohol     ___ Return to work/school ________  Special Instructions:  No driving smoking or alcohol  Follow-up PCP 3 to 5 days for repeat chemistries and monitoring of renal function.  My questions have been answered and I understand these instructions. I will adhere to these goals and the provided educational materials after my discharge from the hospital.  Patient/Caregiver Signature _______________________________ Date __________  Clinician Signature _______________________________________ Date __________  Please bring this form and your medication list with you to all your follow-up doctor's appointments.    Information on my medicine - ELIQUIS (apixaban)  This medication education was reviewed with me or my healthcare representative as part of my discharge preparation.  The pharmacist that spoke with me during my hospital stay was:    Why was Eliquis prescribed for you? Eliquis was prescribed for you to reduce the risk of a blood clot forming that can cause a stroke if you have a medical condition called atrial fibrillation (a type of irregular heartbeat).  What do You need to know about Eliquis ? Take your Eliquis TWICE DAILY - one tablet in the morning and one tablet in the evening with or without food. If you have difficulty swallowing  the tablet whole please discuss with your pharmacist how to take the medication safely.  Take Eliquis exactly as prescribed by your doctor and DO NOT stop taking Eliquis without talking to the doctor who prescribed the medication.  Stopping may increase your risk of developing a stroke.  Refill your prescription before you run out.  After discharge, you should have regular check-up appointments with your healthcare provider that is prescribing your Eliquis.  In the future your dose may need to be changed if your kidney function or weight changes by a significant amount or as you get older.  What do you do if you miss a dose? If you miss a dose, take it as soon as you remember on the same day and resume taking twice daily.  Do not take more than one dose of ELIQUIS at the same time to make up a missed dose.  Important Safety Information A possible side effect of Eliquis is bleeding. You should call your healthcare provider right away if you experience any of the following: Bleeding from an injury or your nose that does not stop. Unusual colored urine (red or dark brown) or unusual colored stools (red or black). Unusual bruising for unknown reasons. A serious fall or if you hit your head (even if there is no bleeding).  Some medicines may interact with Eliquis and might increase your risk of bleeding or clotting while on Eliquis. To help avoid this, consult your healthcare provider or pharmacist prior to using any new prescription or non-prescription medications, including herbals, vitamins, non-steroidal anti-inflammatory drugs (NSAIDs) and supplements.  This website has more information on Eliquis (apixaban): http://www.eliquis.com/eliquis/home

## 2021-02-11 NOTE — TOC Transition Note (Signed)
Transition of Care [facility]) - CM/SW Discharge Note   Patient Details  Name: Auryn Paige MRN: [id number] Date of Birth: [birth year]  [facility] CM/SW Contact:  Shelbie Hutching, RN Phone Number: 02/11/2021, 11:06 AM   Clinical Narrative:    Patient is medically cleared for discharge to [facility] inpatient rehab today.  [facility] will provide transport and inpatient rehab coordinator has scheduled transport for 1 pm.     Final next level of care: [place] Barriers to Discharge: Barriers Resolved   Patient Goals and CMS Choice Patient states their goals for this hospitalization and ongoing recovery are:: looking forward to inpatient rehab CMS Medicare.gov Compare Post Acute Care list provided to:: Patient Choice offered to / list presented to : Patient, Adult Children  Discharge Placement              Patient chooses bed at: Other - please specify in the comment section below: ([facility]) Patient to be transferred to facility by: [facility] Name of family member notified: significant other, [name] Patient and family notified of of transfer: 02/11/21  Discharge Plan and Services   Discharge Planning Services: CM Consult Post Acute Care Choice: IP Rehab          DME Arranged: N/A DME Agency: NA       HH Arranged: NA HH Agency: NA        Social Determinants of Health ([place]) Interventions     Readmission Risk Interventions Readmission Risk Prevention Plan 07/09/2018  Transportation Screening Complete  PCP or Specialist Appt within 5-7 Days Complete  Home Care Screening Complete  Medication Review (RN CM) Complete

## 2021-02-12 DIAGNOSIS — S065X5S Traumatic subdural hemorrhage with loss of consciousness greater than 24 hours with return to pre-existing conscious level, sequela: Secondary | ICD-10-CM

## 2021-02-12 DIAGNOSIS — N184 Chronic kidney disease, stage 4 (severe): Secondary | ICD-10-CM

## 2021-02-12 DIAGNOSIS — I5032 Chronic diastolic (congestive) heart failure: Secondary | ICD-10-CM

## 2021-02-12 DIAGNOSIS — I10 Essential (primary) hypertension: Secondary | ICD-10-CM

## 2021-02-12 LAB — COMPREHENSIVE METABOLIC PANEL
ALT: 15 U/L (ref 0–44)
AST: 16 U/L (ref 15–41)
Albumin: 2.8 g/dL — ABNORMAL LOW (ref 3.5–5.0)
Alkaline Phosphatase: 54 U/L (ref 38–126)
Anion gap: 9 (ref 5–15)
BUN: 44 mg/dL — ABNORMAL HIGH (ref 8–23)
CO2: 28 mmol/L (ref 22–32)
Calcium: 8.8 mg/dL — ABNORMAL LOW (ref 8.9–10.3)
Chloride: 101 mmol/L (ref 98–111)
Creatinine, Ser: 2.05 mg/dL — ABNORMAL HIGH (ref 0.61–1.24)
GFR, Estimated: 32 mL/min — ABNORMAL LOW (ref >60)
Glucose, Bld: 129 mg/dL — ABNORMAL HIGH (ref 70–99)
Potassium: 4 mmol/L (ref 3.5–5.1)
Sodium: 138 mmol/L (ref 135–145)
Total Bilirubin: 0.8 mg/dL (ref 0.3–1.2)
Total Protein: 6 g/dL — ABNORMAL LOW (ref 6.5–8.1)

## 2021-02-12 LAB — CBC WITH DIFFERENTIAL/PLATELET
Abs Immature Granulocytes: 0.05 10*3/uL (ref 0.00–0.07)
Basophils Absolute: 0 10*3/uL (ref 0.0–0.1)
Basophils Relative: 1 %
Eosinophils Absolute: 0.3 10*3/uL (ref 0.0–0.5)
Eosinophils Relative: 5 %
HCT: 35.7 % — ABNORMAL LOW (ref 39.0–52.0)
Hemoglobin: 11.5 g/dL — ABNORMAL LOW (ref 13.0–17.0)
Immature Granulocytes: 1 %
Lymphocytes Relative: 12 %
Lymphs Abs: 0.8 10*3/uL (ref 0.7–4.0)
MCH: 29.8 pg (ref 26.0–34.0)
MCHC: 32.2 g/dL (ref 30.0–36.0)
MCV: 92.5 fL (ref 80.0–100.0)
Monocytes Absolute: 0.8 10*3/uL (ref 0.1–1.0)
Monocytes Relative: 12 %
Neutro Abs: 4.4 10*3/uL (ref 1.7–7.7)
Neutrophils Relative %: 69 %
Platelets: 235 10*3/uL (ref 150–400)
RBC: 3.86 MIL/uL — ABNORMAL LOW (ref 4.22–5.81)
RDW: 13.2 % (ref 11.5–15.5)
WBC: 6.3 10*3/uL (ref 4.0–10.5)
nRBC: 0 % (ref 0.0–0.2)

## 2021-02-12 LAB — GLUCOSE, CAPILLARY
Glucose-Capillary: 128 mg/dL — ABNORMAL HIGH (ref 70–99)
Glucose-Capillary: 175 mg/dL — ABNORMAL HIGH (ref 70–99)
Glucose-Capillary: 165 mg/dL — ABNORMAL HIGH (ref 70–99)
Glucose-Capillary: 129 mg/dL — ABNORMAL HIGH (ref 70–99)

## 2021-02-12 MED ORDER — JUVEN PO PACK
1.0000 | PACK | Freq: Two times a day (BID) | ORAL | Status: DC
  Administered 2021-02-12 – 2021-02-22 (×4): 1 via ORAL
  Filled 2021-02-12 (×12): qty 1

## 2021-02-12 NOTE — Progress Notes (Addendum)
PROGRESS NOTE   Subjective/Complaints:  No issues overnite denies HA, hx chronic Left visual impairment  ROS: Patient denies CP, SOB, N/V/D   Objective:   CT HEAD WO CONTRAST (5MM)  Result Date: 02/10/2021 CLINICAL DATA:  Follow-up subdural hematoma. EXAM: CT HEAD WITHOUT CONTRAST TECHNIQUE: Contiguous axial images were obtained from the base of the skull through the vertex without intravenous contrast. COMPARISON:  Head CT 02/05/2021 FINDINGS: Brain: Sequelae of left-sided craniotomy for subdural hematoma evacuation are again identified. Residual mixed density collection over the left cerebral convexity has an unchanged maximal thickness of 1.3 cm. A small amount of gas within the collection has decreased. A small amount of low-density extra-axial fluid immediately subjacent to the craniotomy flap measures up to 7 mm in thickness and has increased. Mild mass effect on the left cerebral hemisphere is unchanged including trace rightward midline shift. No new intracranial hemorrhage, acute infarct, or mass is identified. There is mild cerebral atrophy. Hypodensities in the cerebral white matter bilaterally are unchanged and nonspecific but compatible with mild chronic small vessel ischemic disease. Vascular: Calcified atherosclerosis at the skull base. No hyperdense vessel. Skull: Left frontoparietal craniotomy with skin staples remaining in place. Interval removal of the left scalp drain. Sinuses/Orbits: Unremarkable included orbits. Visualized paranasal sinuses and mastoid air cells are clear. Other: None. IMPRESSION: 1. Residual mixed density left-sided subdural hematoma with minimal interval changes as above. Unchanged mass effect and trace rightward midline shift. 2. No evidence of new intracranial hemorrhage. 3. Mild chronic small vessel ischemic disease. Electronically Signed   By: Logan Bores M.D.   On: 02/10/2021 12:45   Recent Labs     02/10/21 0443 02/12/21 0507  WBC 7.0 6.3  HGB 12.1* 11.5*  HCT 37.4* 35.7*  PLT 234 235   Recent Labs    02/10/21 0443 02/12/21 0507  NA 137 138  K 3.6 4.0  CL 100 101  CO2 30 28  GLUCOSE 153* 129*  BUN 47* 44*  CREATININE 1.91* 2.05*  CALCIUM 8.5* 8.8*    Intake/Output Summary (Last 24 hours) at 02/12/2021 0820 Last data filed at 02/12/2021 0656 Gross per 24 hour  Intake 240 ml  Output 1326 ml  Net -1086 ml        Physical Exam: Vital Signs Blood pressure (!) 189/64, pulse (!) 48, temperature 98.4 F (36.9 C), temperature source Oral, resp. rate 16, weight 103.4 kg, SpO2 100 %.   General: No acute distress Mood and affect are appropriate Heart: Regular rate and rhythm no rubs murmurs or extra sounds Lungs: Clear to auscultation, breathing unlabored, no rales or wheezes Abdomen: Positive bowel sounds, soft nontender to palpation, nondistended Extremities: No clubbing, cyanosis, or edema Skin: No evidence of breakdown, no evidence of rash   Neuro:  Pt alert. Follows basic commands. Decreased visual acuity. Delayed processing fair insight and memory. RUE4/5 prox to distal. LUE 5-/5. RLE 4/5 prox to distal, LLE 4+ to 5-/5. No focal sensory findings. DTR's 1+. No abnl tone Musculoskeletal: normal rom. No pain    Assessment/Plan: 1. Functional deficits which require 3+ hours per day of interdisciplinary therapy in a comprehensive inpatient rehab setting. Physiatrist is providing close team  supervision and 24 hour management of active medical problems listed below. Physiatrist and rehab team continue to assess barriers to discharge/monitor patient progress toward functional and medical goals  Care Tool:  Bathing              Bathing assist       Upper Body Dressing/Undressing Upper body dressing        Upper body assist      Lower Body Dressing/Undressing Lower body dressing            Lower body assist       Toileting Toileting     Toileting assist Assist for toileting: Dependent - Patient 0%     Transfers Chair/bed transfer  Transfers assist           Locomotion Ambulation   Ambulation assist              Walk 10 feet activity   Assist           Walk 50 feet activity   Assist           Walk 150 feet activity   Assist           Walk 10 feet on uneven surface  activity   Assist           Wheelchair     Assist               Wheelchair 50 feet with 2 turns activity    Assist            Wheelchair 150 feet activity     Assist          Blood pressure (!) 189/64, pulse (!) 48, temperature 98.4 F (36.9 C), temperature source Oral, resp. rate 16, weight 103.4 kg, SpO2 100 %.  Medical Problem List and Plan: 1.  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  Status post craniotomy evacuation 02/05/2021             -patient may not shower unless covers hair/head             -ELOS/Goals: 12-14 days- supervision   2.  Antithrombotics: -DVT/anticoagulation:  Pharmaceutical: Other (comment) Eliquis             -antiplatelet therapy: N/A 3. Pain Management: Fioricet, oxycodone 4. Mood: Provide emotional support  -monitor interactions with staff             -antipsychotic agents: N/A 5. Neuropsych: This patient is capable of making decisions on his own behalf. 6. Skin/Wound Care: Routine skin checks 7. Fluids/Electrolytes/Nutrition: Routine in and out with follow-up chemistry  I personally reviewed the patient's labs today.    -low albumin---daily protein supp  -encourage PO 8.  Diabetes mellitus peripheral neuropathy.  Hemoglobin A1c 7.3.  NovoLog 3 units 3 times daily with meals, Semglee 10 units daily.     CBG (last 3)  Recent Labs    02/12/21 1655 02/12/21 2140 02/13/21 0616  GLUCAP 165* 129* 116*  Controlled 11/24  9.  CKD stage IV.  Continue torsemide for now, Zaroxolyn on hold.  -Cr  in range of 1.9-2.1- stable  10.  Obesity.  BMI 34.97.  Dietary follow-up 11.  Chronic atrial fibrillation.  Cardiac rate controlled.  Eliquis resumed 02/11/2021 12.  Hypertension.  Imdur 30 mg daily, Demadex 60 mg daily   Vitals:   02/12/21 2030 02/13/21 0437  BP: (!) 163/63 140/88  Pulse: (!) 50 Marland Kitchen)  55  Resp: 18 20  Temp: 98 F (36.7 C) 97.9 F (36.6 C)  SpO2: 97% 94%    13.  Chronic diastolic congestive heart failure.  Continue Demadex 60 mg daily.  Monitor for any signs of fluid overload  -oxygen prn, wean to off [facility] Weights   02/11/21 1549  Weight: 103.4 kg    14.  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  Continue eyedrops. 15.  CAD with history of stenting.  Continue Imdur.  No chest pain or shortness of breath    LOS: 1 days A FACE TO FACE EVALUATION WAS PERFORMED  Meredith Staggers 02/12/2021, 8:20 AM

## 2021-02-12 NOTE — Evaluation (Signed)
Speech Language Pathology Assessment and Plan  Patient Details  Name: Kyle Roberson MRN: [id number] Date of Birth: March 13, 1941  SLP Diagnosis: Cognitive Impairments  Rehab Potential: Excellent ELOS: 12-14 days    Today's Date: 02/12/2021 SLP Individual Time: 3382-5053 SLP Individual Time Calculation (min): 10 min   Hospital Problem: Principal Problem:   Traumatic subdural hematoma  Past Medical History:  Past Medical History:  Diagnosis Date   AKI (acute kidney injury) ([place]) 07/06/2018   CAD (coronary artery disease)    Cellulitis of lower extremity 05/31/2019   Diabetes ([place])    GERD (gastroesophageal reflux disease)    HLD (hyperlipidemia)    HTN (hypertension)    Past Surgical History:  Past Surgical History:  Procedure Laterality Date   BLEPHAROPLASTY     CARDIAC CATHETERIZATION     CORONARY STENT INTERVENTION     CRANIOTOMY Left 02/04/2021   Procedure: CRANIOTOMY FOR LEFT SUBDURAL  HEMATOMA EVACUATION;  Surgeon: Deetta Perla, MD;  Location: [facility] ORS;  Service: Neurosurgery;  Laterality: Left;   CYSTOSCOPY     HERNIA REPAIR      Assessment / Plan / Recommendation Clinical Impression Patient is an 80 year old right-handed male with history of diabetes mellitus, hypertension, CKD stage IV, hyperlipidemia, CAD with stenting maintained on Eliquis, chronic diastolic congestive heart failure, tobacco abuse, 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  Patient does endorse a fall in August 2022 cranial CT scan at that time showed no acute intracranial process..  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  No other new acute intracranial abnormality.  Admission chemistries unremarkable aside glucose 261 BUN 39 creatinine 2.04, hemoglobin A1c 7.3.  MRI 02/03/2021 showed nonacute subdural hematoma on the left with cortical mass-effect and 5 mm midline shift.  MRA no emergent finding of proximal flow-limiting stenosis.  Follow-up neurosurgery due to patient's progressive changes underwent left craniotomy evacuation of subdural hematoma 02/05/2021 per Dr. Lacinda Axon.  Latest follow-up CT scan showed no evidence of new intracranial hemorrhage.  Patient was cleared to resume chronic Eliquis 02/11/2021.  Tolerating a regular consistency diet.  Therapy evaluations completed due to patient's right side weakness and aphasia. Patient admitted for a comprehensive rehab program 02/11/21.  Patient demonstrates behaviors consistent with a [place] Level VII. Patient was administered the Cognistat and scored WNL on all subtests with the exception of mild deficits in short-term recall and reasoning and moderate impairments in attention, calculations and visual construction tasks (patient blind in left eye). Throughout session, patient was verbose and required frequent redirection. Moderate deficits in safety awareness were also observed during functional tasks.  Patient without evidence of word-finding throughout session but mild phonemic paraphasias noted with patient independently self-monitoring and correcting errors. Patient would benefit from skilled SLP intervention to maximize his cognitive functioning and overall functional independence prior to discharge.    Skilled Therapeutic Interventions          Administered a cognitive-linguistic evaluation, please see above for details. At end of session, patient requested the urinal. Due to urgency, patient was incontinent with Mod verbal cues needed for safety with task as he was attempting to stand without  assistance and  without locking his brakes. SLP provided education regarding utilization of call bell to request assistance. Patient verbalized understanding. Patient left upright in wheelchair with alarm on and all needs within reach. Continue with current plan of care.    SLP Assessment  Patient will need skilled [facility] during CIR admission    Recommendations  Oral Care Recommendations: Oral care BID Recommendations for Other Services: Neuropsych consult Patient destination: Home Follow up Recommendations:  (TBD) Equipment Recommended: None recommended by SLP    SLP Frequency 3 to 5 out of 7 days   SLP Duration  SLP Intensity  SLP Treatment/Interventions 12-14 days  Minumum of 1-2 x/day, 30 to 90 minutes  Cognitive remediation/compensation;Internal/external aids;Therapeutic Activities;Environmental controls;Cueing hierarchy;Functional tasks;Patient/family education    Pain Pain Assessment Pain Scale: 0-10 Pain Score: 0-No pain  Prior Functioning Type of Home: House  Lives With: Significant other Available Help at Discharge: Family;Available PRN/intermittently  SLP Evaluation Cognition Overall Cognitive Status: Impaired/Different from baseline Arousal/Alertness: Awake/alert Orientation Level: Oriented X4 Memory: Impaired Memory Impairment: Decreased recall of new information;Decreased short term memory Decreased Short Term Memory: Functional basic Awareness: Impaired Awareness Impairment: Emergent impairment Problem Solving: Impaired Problem Solving Impairment: Functional complex Behaviors: Impulsive Safety/Judgment: Impaired [place] [employer] Scales of Cognitive Functioning: Automatic/appropriate  Comprehension Auditory Comprehension Overall Auditory Comprehension: Appears within functional limits for tasks assessed Expression Expression Primary Mode of Expression: Verbal Verbal Expression Overall Verbal Expression: Appears within functional  limits for tasks assessed Written Expression Dominant Hand: Right Written Expression: Not tested Oral Motor Oral Motor/Sensory Function Overall Oral Motor/Sensory Function: Within functional limits Motor Speech Overall Motor Speech: Appears within functional limits for tasks assessed  Care Tool Care Tool Cognition Ability to hear (with hearing aid or hearing appliances if normally used Ability to hear (with hearing aid or hearing appliances if normally used): 1. Minimal difficulty - difficulty in some environments (e.g. when person speaks softly or setting is noisy)   Expression of Ideas and Wants Expression of Ideas and Wants: 3. Some difficulty - exhibits some difficulty with expressing needs and ideas (e.g, some words or finishing thoughts) or speech is not clear   Understanding Verbal and Non-Verbal Content Understanding Verbal and Non-Verbal Content: 3. Usually understands - understands most conversations, but misses some part/intent of message. Requires cues at times to understand  Memory/Recall Ability Memory/Recall Ability : Current season;That he or she is in a hospital/hospital unit     Short Term Goals: Week 1: SLP Short Term Goal 1 (Week 1): Patient will demonstrate functional problem solving for mildly complex tasks with supervision level verbal cues. SLP Short Term Goal 2 (Week 1): Patient will demonstrate selective attention to functional tasks in a mildly distracting enviornment for 30 minutes with Min verbal cues for redirection. SLP Short Term Goal 3 (Week 1): Patient will recall new, daily information with supervision level verbal cues.  Refer to Care Plan for Long Term Goals  Recommendations for other services: None   Discharge Criteria: Patient will be discharged from SLP if patient refuses treatment 3 consecutive times without medical reason, if treatment goals not met, if there is a change in medical status, if patient makes no progress towards goals or if patient  is discharged from hospital.  The above assessment, treatment plan, treatment alternatives and goals were discussed and mutually agreed upon: by patient  Nitin Mckowen 02/12/2021, 12:51 PM

## 2021-02-12 NOTE — Evaluation (Signed)
Physical Therapy Assessment and Plan  Patient Details  Name: Kyle Roberson MRN: [id number] Date of Birth: Jan 14, 1941  PT Diagnosis: Difficulty walking, Hemiplegia dominant, and Muscle weakness Rehab Potential: Good ELOS: 12-14 Days   Today's Date: 02/12/2021 PT Individual Time: 8250-5397 PT Individual Time Calculation (min): 70 min    Hospital Problem: Principal Problem:   Traumatic subdural hematoma   Past Medical History:  Past Medical History:  Diagnosis Date   AKI (acute kidney injury) ([place]) 07/06/2018   CAD (coronary artery disease)    Cellulitis of lower extremity 05/31/2019   Diabetes ([place])    GERD (gastroesophageal reflux disease)    HLD (hyperlipidemia)    HTN (hypertension)    Past Surgical History:  Past Surgical History:  Procedure Laterality Date   BLEPHAROPLASTY     CARDIAC CATHETERIZATION     CORONARY STENT INTERVENTION     CRANIOTOMY Left 02/04/2021   Procedure: CRANIOTOMY FOR LEFT SUBDURAL  HEMATOMA EVACUATION;  Surgeon: Deetta Perla, MD;  Location: [facility] ORS;  Service: Neurosurgery;  Laterality: Left;   CYSTOSCOPY     HERNIA REPAIR      Assessment & Plan Clinical Impression: Patient is a 80 year old right-handed male with history of diabetes mellitus, hypertension, CKD stage IV, hyperlipidemia, CAD with stenting maintained on Eliquis, chronic diastolic congestive heart failure, tobacco abuse, 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  Per chart review patient lives with significant other.  Modified independent with a quad cane.  1 level home with ramped entrance.  Presented to [facility] 02/04/2021 with bouts of dizziness and difficulty with hand coordination over a 1 day period as well as expressive aphasia with increasing right side weakness.  Patient does endorse a fall in August 2022 cranial CT scan at that time showed no acute intracranial process..  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  No other new acute intracranial abnormality.  Admission chemistries unremarkable aside glucose 261 BUN 39 creatinine 2.04, hemoglobin A1c 7.3.  MRI 02/03/2021 showed nonacute subdural hematoma on the left with cortical mass-effect and 5 mm midline shift.  MRA no emergent finding of proximal flow-limiting stenosis.  Follow-up neurosurgery due to patient's progressive changes underwent left craniotomy evacuation of subdural hematoma 02/05/2021 per Dr. Lacinda Axon.  Latest follow-up CT scan showed no evidence of new intracranial hemorrhage.  Patient was cleared to resume chronic Eliquis 02/11/2021.  Tolerating a regular consistency diet. Patient transferred to CIR on 02/11/2021 .   Patient currently requires min with mobility secondary to muscle weakness, decreased cardiorespiratoy endurance, decreased coordination, decreased problem solving, decreased safety awareness, and decreased memory, and decreased sitting balance, decreased standing balance, decreased postural control, hemiplegia, and decreased balance strategies.  Prior to hospitalization, patient was independent  with mobility and lived with Significant other in a House home.  Home access is  Ramped entrance.  Patient will benefit from skilled PT intervention to maximize safe functional mobility, minimize fall risk, and decrease caregiver burden for planned discharge home with intermittent assist.  Anticipate patient will benefit from follow up OP at discharge.  PT - End of Session Activity Tolerance: Tolerates 30+ min activity with multiple rests Endurance Deficit: Yes PT Assessment Rehab Potential (ACUTE/IP ONLY): Good PT Patient demonstrates impairments in the following area(s): Balance;Endurance;Motor;Perception;Safety PT Transfers Functional Problem(s):  Bed Mobility;Bed to Chair;Car;Furniture  PT Locomotion Functional Problem(s): Ambulation;Stairs PT Plan PT Intensity: Minimum of 1-2 x/day ,45 to 90 minutes PT Frequency: 5 out of 7 days PT Duration Estimated Length of Stay: 12-14 Days PT Treatment/Interventions: Ambulation/gait training;Community reintegration;DME/adaptive equipment instruction;Neuromuscular re-education;Psychosocial support;Stair training;UE/LE Strength taining/ROM;Balance/vestibular training;Functional electrical stimulation;Discharge planning;Pain management;Skin care/wound management;Therapeutic Activities;UE/LE Coordination activities;Cognitive remediation/compensation;Disease management/prevention;Patient/family education;Functional mobility training;Splinting/orthotics;Therapeutic Exercise PT Transfers Anticipated Outcome(s): Supervision PT Locomotion Anticipated Outcome(s): Supervision PT Recommendation Follow Up Recommendations: Outpatient PT Patient destination: Home Equipment Recommended: To be determined   PT Evaluation Precautions/Restrictions Precautions Precautions: Fall Precaution Comments: Maintain BP <160; HOB >30 degrees Restrictions Weight Bearing Restrictions: No General Chart Reviewed: Yes Family/Caregiver Present: Yes Vital Signs Pain Pain Assessment Pain Scale: 0-10 Pain Score: 0-No pain Pain Interference Pain Interference Pain Effect on Sleep: 1. Rarely or not at all Pain Interference with Therapy Activities: 1. Rarely or not at all Pain Interference with Day-to-Day Activities: 1. Rarely or not at all Home Living/Prior [place] Available Help at Discharge: Family;Available PRN/intermittently Type of Home: House Home Access: Ramped entrance Home Layout: One level;Laundry or work area in [employer]: Handicapped height  Lives With: Significant other Prior Function  Able to Take Stairs?: Yes Leisure: Hobbies-yes (Comment) (Gardening) Vision/Perception   Perception Perception: Within Functional Limits Praxis Praxis: Intact  Cognition Overall Cognitive Status: Impaired/Different from baseline Arousal/Alertness: Awake/alert Orientation Level: Oriented X4 Memory: Impaired Memory Impairment: Decreased recall of new information;Decreased short term memory Decreased Short Term Memory: Functional basic Awareness: Impaired Awareness Impairment: Emergent impairment Problem Solving: Impaired Problem Solving Impairment: Functional complex Behaviors: Impulsive Safety/Judgment: Impaired [employer] Scales of Cognitive Functioning: Automatic/appropriate Sensation Sensation Light Touch: Appears Intact Coordination Gross Motor Movements are Fluid and Coordinated: Yes Fine Motor Movements are Fluid and Coordinated: No Motor  Motor Motor: Hemiplegia Motor - Skilled Clinical Observations: Mild R Hemiplegia RUE>RLE   Trunk/Postural Assessment  Cervical Assessment Cervical Assessment:  (forward head) Thoracic Assessment Thoracic Assessment:  (rounded shoulders) Lumbar Assessment Lumbar Assessment:  (posterior pelvic tilt) Postural Control Postural Control: Deficits on evaluation Trunk Control: Generalized Weakness  Balance Balance Balance Assessed: Yes Static Sitting Balance Static Sitting - Balance Support: Feet supported Static Sitting - Level of Assistance: 5: Stand by assistance Dynamic Sitting Balance Dynamic Sitting - Balance Support: Feet supported Dynamic Sitting - Level of Assistance: 5: Stand by assistance Static Standing Balance Static Standing - Balance Support: During functional activity Static Standing - Level of Assistance: 4: Min assist Dynamic Standing Balance Dynamic Standing - Balance Support: During functional activity Dynamic Standing - Level of Assistance: 4: Min assist Extremity Assessment  RLE Assessment RLE Assessment: Exceptions to [facility] General Strength Comments: Grossly 4/5 LLE Assessment LLE  Assessment: Exceptions to [facility] General Strength Comments: Grossly 4/5  Care Tool Care Tool Bed Mobility Roll left and right activity   Roll left and right assist level: Minimal Assistance - Patient > 75%    Sit to lying activity   Sit to lying assist level: Moderate Assistance - Patient 50 - 74%    Lying to sitting on side of bed activity   Lying to sitting on side of bed assist level: the ability to move from lying on the back to sitting on the side of the bed with no back support.: Moderate Assistance - Patient 50 - 74%     Care Tool Transfers Sit to stand transfer   Sit to stand assist level: Moderate Assistance - Patient 50 - 74%    Chair/bed transfer   Chair/bed transfer assist  level: Moderate Assistance - Patient 50 - 74%     Toilet transfer   Assist Level: Moderate Assistance - Patient 50 - 74%    Car transfer   Car transfer assist level: Minimal Assistance - Patient > 75%      Care Tool Locomotion Ambulation   Assist level: Minimal Assistance - Patient > 75% Assistive device: No Device Max distance: 200'  Walk 10 feet activity   Assist level: Minimal Assistance - Patient > 75% Assistive device: No Device   Walk 50 feet with 2 turns activity   Assist level: Minimal Assistance - Patient > 75% Assistive device: No Device  Walk 150 feet activity   Assist level: Minimal Assistance - Patient > 75% Assistive device: No Device  Walk 10 feet on uneven surfaces activity   Assist level: Minimal Assistance - Patient > 75%    Stairs   Assist level: Minimal Assistance - Patient > 75% Stairs assistive device: 2 hand rails Max number of stairs: 12  Walk up/down 1 step activity   Walk up/down 1 step (curb) assist level: Minimal Assistance - Patient > 75% Walk up/down 1 step or curb assistive device: 2 hand rails  Walk up/down 4 steps activity   Walk up/down 4 steps assist level: Minimal Assistance - Patient > 75% Walk up/down 4 steps assistive device: 2 hand rails  Walk  up/down 12 steps activity   Walk up/down 12 steps assist level: Minimal Assistance - Patient > 75% Walk up/down 12 steps assistive device: 2 hand rails  Pick up small objects from floor Pick up small object from the floor (from standing position) activity did not occur: Safety/medical concerns Pick up small object from the floor assist level: Maximal Assistance - Patient 25 - 49%    Wheelchair Is the patient using a wheelchair?: No          Wheel 50 feet with 2 turns activity      Wheel 150 feet activity        Refer to Care Plan for Long Term Goals  SHORT TERM GOAL WEEK 1 PT Short Term Goal 1 (Week 1): Pt will perform bed mobility with minA. PT Short Term Goal 2 (Week 1): Pt will perform bed to chair transfer with CGA. PT Short Term Goal 3 (Week 1): Pt will ambulate x150' with LRAD.  Recommendations for other services: None   Skilled Therapeutic Intervention  Evaluation completed (see details above and below) with education on PT POC and goals and individual treatment initiated with focus on bed mobility, balance, transfers, ambulation, and stair training. Pt received supine in bed and agrees to therapy. No complaint of pain. Pt's girlfriend present and elaborates on pt's condition and has many questions for therapist, which PT addresses as well as providing pt and partner with expectations for rehab. Pt performs supine to sit with modA and cues for logrolling and body mechanics. Pt performs sit to stand with modA initially and noted posterior bias, and stand step transfer to [facility] with minA for stability and cues for positioning and hand placement for safety. WC transport to gym for time management. Pt perform sit to stand with minA and improved sequencing and body mechanics, then ambulates x200' with minA and no AD, with PT providing multimodal cues for upright gaze to improve posture and balance, and increasing gait speed and stride length to decrease risk for falls. Pt completes x12 6"  steps with bilateral hand rails and minA, with cues for step sequencing. Pt  performs car transfer with minA and PT demonstration prior to attempt. Pt left seated in WC with alarm intact and all needs within reach.   Mobility Bed Mobility Bed Mobility: Supine to Sit;Sit to Supine Supine to Sit: Moderate Assistance - Patient 50-74% Sit to Supine: Moderate Assistance - Patient 50-74% Transfers Transfers: Sit to Stand;Stand to Sit;Stand Pivot Transfers Sit to Stand: Minimal Assistance - Patient > 75% Stand to Sit: Minimal Assistance - Patient > 75% Stand Pivot Transfers: Minimal Assistance - Patient > 75% Stand Pivot Transfer Details: Verbal cues for technique;Verbal cues for sequencing;Tactile cues for sequencing Transfer (Assistive device): None Locomotion  Gait Ambulation: Yes Gait Assistance: Minimal Assistance - Patient > 75% Gait Distance (Feet): 200 Feet Assistive device: None Gait Assistance Details: Verbal cues for sequencing;Verbal cues for gait pattern;Verbal cues for technique;Tactile cues for posture Gait Gait: Yes Gait Pattern: Wide base of support;Decreased stride length Gait velocity: decreased Stairs / Additional Locomotion Stairs: Yes Stairs Assistance: Minimal Assistance - Patient > 75% Stair Management Technique: Two rails Number of Stairs: 12 Height of Stairs: 6 Ramp: Minimal Assistance - Patient >75% Curb: Minimal Assistance - Patient >75% Wheelchair Mobility Wheelchair Mobility: No   Discharge Criteria: Patient will be discharged from PT if patient refuses treatment 3 consecutive times without medical reason, if treatment goals not met, if there is a change in medical status, if patient makes no progress towards goals or if patient is discharged from hospital.  The above assessment, treatment plan, treatment alternatives and goals were discussed and mutually agreed upon: by patient and by family  Breck Coons, PT, DPT 02/12/2021, 4:12 PM

## 2021-02-12 NOTE — Evaluation (Signed)
Occupational Therapy Assessment and Plan  Patient Details  Name: Sidhant Helderman MRN: [id number] Date of Birth: [birth year]  OT Diagnosis: abnormal posture, ataxia, blindness and low vision, cognitive deficits, hemiplegia affecting dominant side, and muscle weakness (generalized) Rehab Potential: Rehab Potential (ACUTE ONLY): Good ELOS: 12-14 days   Today's Date: 02/12/2021 OT Individual Time: 1300-1400 OT Individual Time Calculation (min): 60 min     Hospital Problem: Principal Problem:   Traumatic subdural hematoma   Past Medical History:  Past Medical History:  Diagnosis Date   AKI (acute kidney injury) ([place]) 07/06/2018   CAD (coronary artery disease)    Cellulitis of lower extremity 05/31/2019   Diabetes (HCC)    GERD (gastroesophageal reflux disease)    HLD (hyperlipidemia)    HTN (hypertension)    Past Surgical History:  Past Surgical History:  Procedure Laterality Date   BLEPHAROPLASTY     CARDIAC CATHETERIZATION     CORONARY STENT INTERVENTION     CRANIOTOMY Left 02/04/2021   Procedure: CRANIOTOMY FOR LEFT SUBDURAL  HEMATOMA EVACUATION;  Surgeon: Deetta Perla, MD;  Location: [facility] ORS;  Service: Neurosurgery;  Laterality: Left;   CYSTOSCOPY     HERNIA REPAIR      Assessment & Plan Clinical Impression: Patient is a 80 year old right-handed male with history of diabetes mellitus, hypertension, CKD stage IV, hyperlipidemia, CAD with stenting maintained on Eliquis, chronic diastolic congestive heart failure, tobacco abuse, 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  Per chart review patient lives with significant other.  Modified independent with a quad cane.  1 level home with ramped entrance.  Presented to [facility] 02/04/2021 with bouts of dizziness and difficulty with hand coordination over a 1 day period as well as expressive aphasia with increasing right side weakness.  Patient does endorse a fall in August 2022 cranial CT scan at that time showed no acute  intracranial process..  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  No other new acute intracranial abnormality.  Admission chemistries unremarkable aside glucose 261 BUN 39 creatinine 2.04, hemoglobin A1c 7.3.  MRI 02/03/2021 showed nonacute subdural hematoma on the left with cortical mass-effect and 5 mm midline shift.  MRA no emergent finding of proximal flow-limiting stenosis.  Follow-up neurosurgery due to patient's progressive changes underwent left craniotomy evacuation of subdural hematoma 02/05/2021 per Dr. Lacinda Axon.  Latest follow-up CT scan showed no evidence of new intracranial hemorrhage.  Patient was cleared to resume chronic Eliquis 02/11/2021.  Tolerating a regular consistency diet. Patient transferred to CIR on 02/11/2021 .    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  Prior to hospitalization, patient could complete ADLs with assist for LB only.   Patient will benefit from skilled intervention to decrease level of assist with basic self-care skills and increase independence with basic self-care skills prior to discharge home with care partner.  Anticipate patient will require 24 hour supervision and follow up home health.  OT - End of Session Activity Tolerance: Tolerates 30+ min activity with multiple rests  Endurance Deficit:  Yes Endurance Deficit Description: Quick to fatigue OT Assessment Rehab Potential (ACUTE ONLY): Good OT Patient demonstrates impairments in the following area(s): Balance;Cognition;Endurance;Motor;Safety;Sensory;Vision OT Basic ADL's Functional Problem(s): Grooming;Bathing;Dressing;Toileting OT Transfers Functional Problem(s): Toilet;Tub/Shower OT Additional Impairment(s): Fuctional Use of Upper Extremity OT Plan OT Intensity: Minimum of 1-2 x/day, 45 to 90 minutes OT Frequency: 5 out of 7 days OT Duration/Estimated Length of Stay: 12-14 days OT Treatment/Interventions: Balance/vestibular training;Cognitive remediation/compensation;Community reintegration;Discharge planning;DME/adaptive equipment instruction;Functional mobility training;Neuromuscular re-education;Patient/family education;Self Care/advanced ADL retraining;Therapeutic Activities;Therapeutic Exercise;UE/LE Strength taining/ROM;UE/LE Coordination activities;Visual/perceptual remediation/compensation OT Self Feeding Anticipated Outcome(s): N/A OT Basic Self-Care Anticipated Outcome(s): supervision to Min A OT Toileting Anticipated Outcome(s): supervision A OT Bathroom Transfers Anticipated Outcome(s): supervision A OT Recommendation Patient destination: Home Follow Up Recommendations: Home health OT Equipment Recommended: To be determined   OT Evaluation Precautions/Restrictions  Precautions Precautions: Fall Precaution Comments: Maintain BP <160; HOB >30 degrees Restrictions Weight Bearing Restrictions: No General Chart Reviewed: Yes Additional Pertinent History: PMHx significant for diabetes mellitus, hypertension, CKD stage IV, hyperlipidemia, CAD with stenting maintained on Eliquis, chronic diastolic congestive heart failure, tobacco abuse, chronic L eye vision loss due to central retinal vein occlusion. Family/Caregiver Present: No Vital Signs Therapy Vitals Temp: 97.6 F (36.4 C) Temp Source: Oral Pulse Rate:  (!) 59 Resp: 16 BP: (!) 157/88 Patient Position (if appropriate): Sitting Oxygen Therapy SpO2: 96 % O2 Device: Room Air Patient Activity (if Appropriate): In chair Pain Pain Assessment Pain Scale: 0-10 Pain Score: 0-No pain Home Living/Prior Functioning Home Living Family/patient expects to be discharged to:: Private residence Living Arrangements: Spouse/significant other Available Help at Discharge: Family, Available PRN/intermittently Type of Home: House Home Access: Ramped entrance Home Layout: One level, Laundry or work area in basement [employer] Shower/Tub: Multimedia programmer: Handicapped height  Lives With: Significant other IADL History Homemaking Responsibilities: Yes Meal Prep Responsibility: Secondary Current License: No Prior Function Level of Independence: Independent with gait, Independent with transfers, Needs assistance with ADLs Bath: Minimal Dressing: Minimal  Able to Take Stairs?: Yes Leisure: Hobbies-yes (Comment) Vision Baseline Vision/History: 1 Wears glasses (Readers; 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 Perception  Perception: Impaired Inattention/Neglect: Does not attend to right side of body (Mild) Praxis Praxis: Impaired Praxis Impairment Details: Motor planning Cognition Overall Cognitive Status: Impaired/Different from baseline Arousal/Alertness: Awake/alert Orientation Level: Person;Situation Year: 2022 Month: November Day of Week: Correct Memory: Impaired Memory Impairment: Decreased recall of new information;Decreased short term memory Decreased Short Term Memory: Functional basic Immediate Memory Recall: Sock;Blue;Bed Memory Recall Sock: Without Cue Memory Recall Blue: Without Cue Memory Recall Bed: With Cue Awareness:  Impaired Awareness Impairment: Emergent impairment Problem Solving: Impaired Problem Solving Impairment: Functional complex Behaviors: Impulsive Safety/Judgment: Impaired Comments: Decreased knowledge of current deficits and decreased knowledge of need for assistance. [employer] Scales of Cognitive Functioning: Automatic/appropriate Sensation Sensation Light Touch: Appears Intact Hot/Cold: Appears Intact Proprioception: Impaired by gross assessment Stereognosis: Not tested Additional Comments: Decreased proprioception of R. Coordination Gross Motor Movements are Fluid and Coordinated: No Fine Motor Movements are Fluid and Coordinated: No Coordination and Movement Description: Decreased coordination on R Finger Nose Finger Test: Completed with L eye occluded 2/2 decreased depth perception with both eyes open. Imparied R>L Motor  Motor Motor: Hemiplegia Motor - Skilled Clinical Observations: Mild R Hemiplegia RUE>RLE  Trunk/Postural Assessment  Cervical Assessment Cervical Assessment:  (  forward head) Thoracic Assessment Thoracic Assessment:  (rounded shoulders) Lumbar Assessment Lumbar Assessment:  (posterior pelvic tilt) Postural Control Postural Control: Deficits on evaluation Trunk Control: Delayed; insufficient  Balance Balance Balance Assessed: Yes Static Sitting Balance Static Sitting - Balance Support: Feet supported Static Sitting - Level of Assistance: 5: Stand by assistance Dynamic Sitting Balance Dynamic Sitting - Balance Support: Feet supported Dynamic Sitting - Level of Assistance: 5: Stand by assistance Static Standing Balance Static Standing - Balance Support: During functional activity Static Standing - Level of Assistance: 4: Min assist Dynamic Standing Balance Dynamic Standing - Balance Support: During functional activity Dynamic Standing - Level of Assistance: 4: Min assist Extremity/Trunk Assessment RUE Assessment RUE Assessment: Exceptions  to [facility] RUE Body System: Neuro Brunstrum levels for arm and hand: Arm;Hand Brunstrum level for arm: Stage IV Movement is deviating from synergy Brunstrum level for hand: Stage IV Movements deviating from synergies LUE Assessment LUE Assessment: Within Functional Limits General Strength Comments: MMT grossly 5/5  Care Tool Care Tool Self Care Eating        Oral Care    Oral Care Assist Level: Set up assist (sitting)    Bathing   Body parts bathed by patient: Right arm;Chest;Abdomen;Front perineal area;Right upper leg;Left upper leg;Face Body parts bathed by helper: Left arm;Buttocks;Right lower leg;Left lower leg   Assist Level: Moderate Assistance - Patient 50 - 74%    Upper Body Dressing(including orthotics)   What is the patient wearing?: Pull over shirt   Assist Level: Set up assist    Lower Body Dressing (excluding footwear)   What is the patient wearing?: Underwear/pull up;Pants Assist for lower body dressing: Moderate Assistance - Patient 50 - 74%    Putting on/Taking off footwear   What is the patient wearing?: [place] for footwear: Maximal Assistance - Patient 25 - 49%       Care Tool Toileting Toileting activity   Assist for toileting: Moderate Assistance - Patient 50 - 74% (Standing)     Care Tool Bed Mobility Roll left and right activity   Roll left and right assist level: Minimal Assistance - Patient > 75%    Sit to lying activity   Sit to lying assist level: Moderate Assistance - Patient 50 - 74%    Lying to sitting on side of bed activity   Lying to sitting on side of bed assist level: the ability to move from lying on the back to sitting on the side of the bed with no back support.: Moderate Assistance - Patient 50 - 74%     Care Tool Transfers Sit to stand transfer   Sit to stand assist level: Moderate Assistance - Patient 50 - 74%    Chair/bed transfer   Chair/bed transfer assist level: Moderate Assistance - Patient 50 - 74%     Toilet  transfer   Assist Level: Moderate Assistance - Patient 50 - 74%     Care Tool Cognition  Expression of Ideas and Wants Expression of Ideas and Wants: 3. Some difficulty - exhibits some difficulty with expressing needs and ideas (e.g, some words or finishing thoughts) or speech is not clear  Understanding Verbal and Non-Verbal Content Understanding Verbal and Non-Verbal Content: 3. Usually understands - understands most conversations, but misses some part/intent of message. Requires cues at times to understand   Memory/Recall Ability Memory/Recall Ability : Current season;That he or she is in a hospital/hospital unit   Refer to Care Plan for [place]  GOAL WEEK 1 OT Short Term Goal 1 (Week 1): Patient will complete toilet transfers with Min A and LRAD. OT Short Term Goal 2 (Week 1): Patient will complete 3/3 parts of toileting task with Min A, AE and LRAD. OT Short Term Goal 3 (Week 1): Patient will complete LB dressing with Min A and LRAD.  Recommendations for other services: Other: TBD    Skilled Therapeutic Intervention Patient met seated in wc in agreement with OT evaluation/treatment. 0/10 pain reported at rest and with activity. Education provided on purpose of rehab, role of OT, ELOS, goals and follow-up recommendations. Patient declining HHOT but would like for TOC to speak with his significant other about outpatient if appropriate at time of d/c. Patient completed bathing/dressing, grooming and toileting as indicated below. Cues for attention to R and safety 2/2 impulsivity. Patient with poor insight and knowledge of current deficits stating "[name] will help me with all of this when I get home". Repeat education provided on purpose and goals of rehab with patient expressing verbal understanding. Patient would benefit from continued intensive OT to maximize safety/independence with self-care tasks in prep for safe d/c home with significant other.   ADL ADL Grooming:  Setup Where Assessed-Grooming: Sitting at sink Upper Body Bathing: Setup Where Assessed-Upper Body Bathing: Sitting at sink Lower Body Bathing: Moderate assistance Where Assessed-Lower Body Bathing: Standing at sink Upper Body Dressing: Supervision/safety;Minimal cueing Where Assessed-Upper Body Dressing: Sitting at sink Lower Body Dressing: Moderate assistance Where Assessed-Lower Body Dressing: Standing at sink Toileting: Moderate assistance Where Assessed-Toileting:  (Urinal) Toilet Transfer Method: Not assessed Walk-In Shower Transfer: Not assessed ADL Comments: Supervision to set-up UB ADLs; Mod A LB ADLs and toileting Mobility  Bed Mobility Bed Mobility: Supine to Sit;Sit to Supine Supine to Sit: Moderate Assistance - Patient 50-74% Sit to Supine: Moderate Assistance - Patient 50-74% Transfers Sit to Stand: Minimal Assistance - Patient > 75% Stand to Sit: Minimal Assistance - Patient > 75%   Discharge Criteria: Patient will be discharged from OT if patient refuses treatment 3 consecutive times without medical reason, if treatment goals not met, if there is a change in medical status, if patient makes no progress towards goals or if patient is discharged from hospital.  The above assessment, treatment plan, treatment alternatives and goals were discussed and mutually agreed upon: by patient  Zina Pitzer R How[place]-Davis 02/12/2021, 2:52 PM

## 2021-02-13 LAB — GLUCOSE, CAPILLARY
Glucose-Capillary: 116 mg/dL — ABNORMAL HIGH (ref 70–99)
Glucose-Capillary: 169 mg/dL — ABNORMAL HIGH (ref 70–99)
Glucose-Capillary: 253 mg/dL — ABNORMAL HIGH (ref 70–99)
Glucose-Capillary: 228 mg/dL — ABNORMAL HIGH (ref 70–99)

## 2021-02-13 NOTE — Progress Notes (Signed)
PROGRESS NOTE   Subjective/Complaints:  No issues overnite denies HA, hx chronic Left visual impairment  ROS: Patient denies CP, SOB, N/V/D   Objective:   No results found. Recent Labs    02/12/21 0507  WBC 6.3  HGB 11.5*  HCT 35.7*  PLT 235    Recent Labs    02/12/21 0507  NA 138  K 4.0  CL 101  CO2 28  GLUCOSE 129*  BUN 44*  CREATININE 2.05*  CALCIUM 8.8*     Intake/Output Summary (Last 24 hours) at 02/13/2021 1036 Last data filed at 02/13/2021 0744 Gross per 24 hour  Intake 1040 ml  Output 925 ml  Net 115 ml         Physical Exam: Vital Signs Blood pressure 140/88, pulse (!) 55, temperature 97.9 F (36.6 C), temperature source Oral, resp. rate 20, weight 103.4 kg, SpO2 94 %.   General: No acute distress Mood and affect are appropriate Heart: Regular rate and rhythm no rubs murmurs or extra sounds Lungs: Clear to auscultation, breathing unlabored, no rales or wheezes Abdomen: Positive bowel sounds, soft nontender to palpation, nondistended Extremities: No clubbing, cyanosis, or edema Skin: No evidence of breakdown, no evidence of rash   Neuro:  Pt alert. Follows basic commands. Decreased visual acuity. Delayed processing fair insight and memory. RUE4/5 prox to distal. LUE 5-/5. RLE 4/5 prox to distal, LLE 4+ to 5-/5. No focal sensory findings. DTR's 1+. No abnl tone Musculoskeletal: normal rom. No pain    Assessment/Plan: 1. Functional deficits which require 3+ hours per day of interdisciplinary therapy in a comprehensive inpatient rehab setting. Physiatrist is providing close team supervision and 24 hour management of active medical problems listed below. Physiatrist and rehab team continue to assess barriers to discharge/monitor patient progress toward functional and medical goals  Care Tool:  Bathing    Body parts bathed by patient: Right arm, Chest, Abdomen, Front perineal area,  Right upper leg, Left upper leg, Face   Body parts bathed by helper: Left arm, Buttocks, Right lower leg, Left lower leg     Bathing assist Assist Level: Moderate Assistance - Patient 50 - 74%     Upper Body Dressing/Undressing Upper body dressing   What is the patient wearing?: Pull over shirt    Upper body assist Assist Level: Set up assist    Lower Body Dressing/Undressing Lower body dressing      What is the patient wearing?: Underwear/pull up, Pants     Lower body assist Assist for lower body dressing: Moderate Assistance - Patient 50 - 74%     Toileting Toileting    Toileting assist Assist for toileting: Moderate Assistance - Patient 50 - 74% (Standing)     Transfers Chair/bed transfer  Transfers assist     Chair/bed transfer assist level: Moderate Assistance - Patient 50 - 74%     Locomotion Ambulation   Ambulation assist      Assist level: Minimal Assistance - Patient > 75% Assistive device: No Device Max distance: 200'   Walk 10 feet activity   Assist     Assist level: Minimal Assistance - Patient > 75% Assistive device: No Device  Walk 50 feet activity   Assist    Assist level: Minimal Assistance - Patient > 75% Assistive device: No Device    Walk 150 feet activity   Assist    Assist level: Minimal Assistance - Patient > 75% Assistive device: No Device    Walk 10 feet on uneven surface  activity   Assist     Assist level: Minimal Assistance - Patient > 75%     Wheelchair     Assist Is the patient using a wheelchair?: No             Wheelchair 50 feet with 2 turns activity    Assist            Wheelchair 150 feet activity     Assist          Blood pressure 140/88, pulse (!) 55, temperature 97.9 F (36.6 C), temperature source Oral, resp. rate 20, weight 103.4 kg, SpO2 94 %.  Medical Problem List and Plan: 1.  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  Status post craniotomy evacuation 02/05/2021             -patient may not shower unless covers hair/head             -ELOS/Goals: 12-14 days- supervision   2.  Antithrombotics: -DVT/anticoagulation:  Pharmaceutical: Other (comment) Eliquis             -antiplatelet therapy: N/A 3. Pain Management: Fioricet, oxycodone 4. Mood: Provide emotional support  -monitor interactions with staff             -antipsychotic agents: N/A 5. Neuropsych: This patient is capable of making decisions on his own behalf. 6. Skin/Wound Care: Routine skin checks 7. Fluids/Electrolytes/Nutrition: Routine in and out with follow-up chemistry  I personally reviewed the patient's labs today.    -low albumin---daily protein supp  -encourage PO 8.  Diabetes mellitus peripheral neuropathy.  Hemoglobin A1c 7.3.  NovoLog 3 units 3 times daily with meals, Semglee 10 units daily.     CBG (last 3)  Recent Labs    02/12/21 1655 02/12/21 2140 02/13/21 0616  GLUCAP 165* 129* 116*   Controlled 11/24  9.  CKD stage IV.  Continue torsemide for now, Zaroxolyn on hold.  -Cr in range of 1.9-2.1- stable  10.  Obesity.  BMI 34.97.  Dietary follow-up 11.  Chronic atrial fibrillation.  Cardiac rate controlled.  Eliquis resumed 02/11/2021 12.  Hypertension.  Imdur 30 mg daily, Demadex 60 mg daily   Vitals:   02/12/21 2030 02/13/21 0437  BP: (!) 163/63 140/88  Pulse: (!) 50 (!) 55  Resp: 18 20  Temp: 98 F (36.7 C) 97.9 F (36.6 C)  SpO2: 97% 94%    13.  Chronic diastolic congestive heart failure.  Continue Demadex 60 mg daily.  Monitor for any signs of fluid overload  -oxygen prn, wean to off [facility] Weights   02/11/21 1549  Weight: 103.4 kg    14.  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  Continue eyedrops. 15.  CAD with history of stenting.  Continue Imdur.  No chest pain or shortness of breath    LOS: 2 days A FACE TO FACE EVALUATION WAS PERFORMED  Charlett Blake 02/13/2021, 10:36 AM

## 2021-02-14 LAB — GLUCOSE, CAPILLARY
Glucose-Capillary: 142 mg/dL — ABNORMAL HIGH (ref 70–99)
Glucose-Capillary: 122 mg/dL — ABNORMAL HIGH (ref 70–99)
Glucose-Capillary: 98 mg/dL (ref 70–99)
Glucose-Capillary: 181 mg/dL — ABNORMAL HIGH (ref 70–99)

## 2021-02-14 MED ORDER — ISOSORBIDE MONONITRATE ER 30 MG PO TB24
60.0000 mg | ORAL_TABLET | Freq: Every day | ORAL | Status: DC
  Administered 2021-02-15 – 2021-02-18 (×4): 60 mg via ORAL
  Filled 2021-02-14 (×4): qty 2

## 2021-02-14 MED ORDER — INSULIN ASPART 100 UNIT/ML IJ SOLN
4.0000 [IU] | Freq: Three times a day (TID) | INTRAMUSCULAR | Status: DC
  Administered 2021-02-14 – 2021-02-27 (×34): 4 [IU] via SUBCUTANEOUS

## 2021-02-14 NOTE — Progress Notes (Addendum)
Occupational Therapy TBI Note  Patient Details  Name: Kyle Roberson MRN: [id number] Date of Birth: 02/16/1941  Today's Date: 02/14/2021 OT Individual Time: 1202-1300 OT Individual Time Calculation (min): 58 min   Short Term Goals: Week 1:  OT Short Term Goal 1 (Week 1): Patient will complete toilet transfers with Min A and LRAD. OT Short Term Goal 2 (Week 1): Patient will complete 3/3 parts of toileting task with Min A, AE and LRAD. OT Short Term Goal 3 (Week 1): Patient will complete LB dressing with Min A and LRAD.  Skilled Therapeutic Interventions/Progress Updates:     Pt greeted seated in wc and agreeable to OT treatment session. Pt's lunch tray arrived and pt needed assistance for opening containers due to R hand weakness. Pt able to grasp fork, but dropping fork often. OT issued red foam to place on handle of eating utensil with improved grasp. Pt did not have another episode of dropping utensil and was able to self feed without assistance. Pt only needed assistance for set-up with foam utensil and opening containers. OT discussed with nursing that patient was appropriate for intermittent supervision with set-up A for meals.  Pt declined any bathing/dressing tasks. Pt reported need to go to the bathroom stand-pivot to toilet with min A and min A to manage clothing. Pt voided bladder with smear of BM. Min A to balance while performing peri-care. Grooming tasks completed from wc at the sink with min A. Focus on functional use of R UE to twist caps and open containers. Discussed PLOF, home set-up, OT goals, plan of care. Pt left seated in wc at end of session with alarm belt on, call bell in reach, and needs met.   Therapy Documentation Precautions:  Precautions Precautions: Fall Precaution Comments: Maintain BP <160; HOB >30 degrees Restrictions Weight Bearing Restrictions: No Pain:  Denies pain Agitated Behavior Scale: TBI Observation Details Observation Environment:  Room Start of observation period - Date: 02/14/21 Start of observation period - Time: 1200 End of observation period - Date: 02/14/21 End of observation period - Time: 1300 Agitated Behavior Scale (DO NOT LEAVE BLANKS) Short attention span, easy distractibility, inability to concentrate: Present to a slight degree Impulsive, impatient, low tolerance for pain or frustration: Present to a slight degree Uncooperative, resistant to care, demanding: Absent Violent and/or threatening violence toward people or property: Absent Explosive and/or unpredictable anger: Absent Rocking, rubbing, moaning, or other self-stimulating behavior: Absent Pulling at tubes, restraints, etc.: Absent Wandering from treatment areas: Absent Restlessness, pacing, excessive movement: Absent Repetitive behaviors, motor, and/or verbal: Absent Rapid, loud, or excessive talking: Absent Sudden changes of mood: Absent Easily initiated or excessive crying and/or laughter: Absent Self-abusiveness, physical and/or verbal: Absent Agitated behavior scale total score: 16   Therapy/Group: Individual Therapy  Valma Cava 02/14/2021, 12:47 PM

## 2021-02-14 NOTE — Progress Notes (Signed)
Speech Language Pathology TBI Note  Patient Details  Name: Kyle Roberson MRN: [id number] Date of Birth: [birth year]  Today's Date: 02/14/2021 SLP Individual Time: 0920-1000 SLP Individual Time Calculation (min): 40 min  Short Term Goals: Week 1: SLP Short Term Goal 1 (Week 1): Patient will demonstrate functional problem solving for mildly complex tasks with supervision level verbal cues. SLP Short Term Goal 2 (Week 1): Patient will demonstrate selective attention to functional tasks in a mildly distracting enviornment for 30 minutes with Min verbal cues for redirection. SLP Short Term Goal 3 (Week 1): Patient will recall new, daily information with supervision level verbal cues.  Skilled Therapeutic Interventions: Skilled treatment session focused on cognitive goals. SLP facilitated session by providing Mod A verbal and visual cues during a basic money management task. Patient also required Mod verbal cues for sustained attention throughout. 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 Patient was Mod I for word-finding but demonstrated increased phonemic paraphasias compared to day of evaluation, suspect due to increased fatigue. Patient also with generalized confusion while trying to answer the questions with minimal awareness of errors. [name] with alarm on and all needs within reach. Continue with current plan of care.      Pain No/Denies Pain   Agitated Behavior Scale: TBI Observation Details Observation Environment: Patient's room Start of observation period - Date: 02/14/21 Start of observation period - Time: 0920 End of observation period - Date: 02/14/21 End of observation period - Time: 1000 Agitated Behavior Scale (DO NOT LEAVE BLANKS) Short attention span, easy distractibility, inability to concentrate: Present to a slight degree Impulsive, impatient, low tolerance for pain or frustration: Present to a  slight degree Uncooperative, resistant to care, demanding: Absent Violent and/or threatening violence toward people or property: Absent Explosive and/or unpredictable anger: Absent Rocking, rubbing, moaning, or other self-stimulating behavior: Absent Pulling at tubes, restraints, etc.: Absent Wandering from treatment areas: Absent Restlessness, pacing, excessive movement: Absent Repetitive behaviors, motor, and/or verbal: Absent Rapid, loud, or excessive talking: Absent Sudden changes of mood: Absent Easily initiated or excessive crying and/or laughter: Absent Self-abusiveness, physical and/or verbal: Absent Agitated behavior scale total score: 16  Therapy/Group: Individual Therapy  Kahleah Crass, [place] 02/14/2021, 12:16 PM

## 2021-02-14 NOTE — Progress Notes (Addendum)
Patient ID: Kyle Roberson, male   DOB: [birth year], 80 y.o.   MRN: [id number]  SW made efforts to contact pt partner Jeannene Patella [phone]) to provide updates on ELOS 12-14 days and SW will follow-up on Tuesday after team conference once more information; however, voicemail box full. SW will continue to make efforts to make contact with her.   *SW received returned phone call from pt partner [name] to provide above updates.   Loralee Pacas, MSW, [place] Office: [phone] Cell: [phone] Fax: [phone]

## 2021-02-14 NOTE — Progress Notes (Signed)
PROGRESS NOTE   Subjective/Complaints:  No c/os today , pt feels ok, denies HA , denies bowel or bladder issues  ROS: Patient denies CP, SOB, N/V/D   Objective:   No results found. Recent Labs    02/12/21 0507  WBC 6.3  HGB 11.5*  HCT 35.7*  PLT 235    Recent Labs    02/12/21 0507  NA 138  K 4.0  CL 101  CO2 28  GLUCOSE 129*  BUN 44*  CREATININE 2.05*  CALCIUM 8.8*     Intake/Output Summary (Last 24 hours) at 02/14/2021 0958 Last data filed at 02/14/2021 0732 Gross per 24 hour  Intake 340 ml  Output 550 ml  Net -210 ml         Physical Exam: Vital Signs Blood pressure (!) 158/53, pulse 62, temperature 97.8 F (36.6 C), resp. rate 19, weight 103.4 kg, SpO2 99 %.  General: No acute distress Mood and affect are appropriate Heart: Regular rate and rhythm no rubs murmurs or extra sounds Lungs: Clear to auscultation, breathing unlabored, no rales or wheezes Abdomen: Positive bowel sounds, soft nontender to palpation, nondistended Extremities: No clubbing, cyanosis, or edema Skin: No evidence of breakdown, no evidence of rash  Musculoskeletal: Full range of motion in all 4 extremities. No joint swelling  Neuro:  Pt alert. Follows basic commands. Decreased visual acuity. Delayed processing fair insight and memory. RUE4/5 prox to distal. LUE 5-/5. RLE 4/5 prox to distal, LLE 4+ to 5-/5. No focal sensory findings. DTR's 1+. No abnl tone    Assessment/Plan: 1. Functional deficits which require 3+ hours per day of interdisciplinary therapy in a comprehensive inpatient rehab setting. Physiatrist is providing close team supervision and 24 hour management of active medical problems listed below. Physiatrist and rehab team continue to assess barriers to discharge/monitor patient progress toward functional and medical goals  Care Tool:  Bathing    Body parts bathed by patient: Right arm, Chest, Abdomen,  Front perineal area, Right upper leg, Left upper leg, Face   Body parts bathed by helper: Left arm, Buttocks, Right lower leg, Left lower leg     Bathing assist Assist Level: Moderate Assistance - Patient 50 - 74%     Upper Body Dressing/Undressing Upper body dressing   What is the patient wearing?: Hospital gown only    Upper body assist Assist Level: Moderate Assistance - Patient 50 - 74%    Lower Body Dressing/Undressing Lower body dressing      What is the patient wearing?: Incontinence brief     Lower body assist Assist for lower body dressing: Total Assistance - Patient < 25%     Toileting Toileting    Toileting assist Assist for toileting: Moderate Assistance - Patient 50 - 74%     Transfers Chair/bed transfer  Transfers assist     Chair/bed transfer assist level: Moderate Assistance - Patient 50 - 74%     Locomotion Ambulation   Ambulation assist      Assist level: Minimal Assistance - Patient > 75% Assistive device: No Device Max distance: 200'   Walk 10 feet activity   Assist     Assist level: Minimal Assistance -  Patient > 75% Assistive device: No Device   Walk 50 feet activity   Assist    Assist level: Minimal Assistance - Patient > 75% Assistive device: No Device    Walk 150 feet activity   Assist    Assist level: Minimal Assistance - Patient > 75% Assistive device: No Device    Walk 10 feet on uneven surface  activity   Assist     Assist level: Minimal Assistance - Patient > 75%     Wheelchair     Assist Is the patient using a wheelchair?: No             Wheelchair 50 feet with 2 turns activity    Assist            Wheelchair 150 feet activity     Assist          Blood pressure (!) 158/53, pulse 62, temperature 97.8 F (36.6 C), resp. rate 19, weight 103.4 kg, SpO2 99 %.  Medical Problem List and Plan: 1.  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  Status post craniotomy evacuation 02/05/2021             -patient may not shower unless covers hair/head             -ELOS/Goals: 12-14 days- supervision   2.  Antithrombotics: -DVT/anticoagulation:  Pharmaceutical: Other (comment) Eliquis             -antiplatelet therapy: N/A 3. Pain Management: Fioricet, oxycodone 4. Mood: Provide emotional support  -monitor interactions with staff             -antipsychotic agents: N/A 5. Neuropsych: This patient is capable of making decisions on his own behalf. 6. Skin/Wound Care: Routine skin checks 7. Fluids/Electrolytes/Nutrition: Routine in and out with follow-up chemistry  I personally reviewed the patient's labs today.    -low albumin---daily protein supp  -encourage PO 8.  Diabetes mellitus peripheral neuropathy.  Hemoglobin A1c 7.3.  NovoLog 3 units 3 times daily with meals, Semglee 10 units daily.     CBG (last 3)  Recent Labs    02/13/21 1646 02/13/21 2100 02/14/21 0558  GLUCAP 253* 228* 142*   Controlled 11/25 in am but elevated during the day 11/24, increase novalog to 4U  9.  CKD stage IV.  Continue torsemide for now, Zaroxolyn on hold.  -Cr in range of 1.9-2.1- stable  10.  Obesity.  BMI 34.97.  Dietary follow-up 11.  Chronic atrial fibrillation.  Cardiac rate controlled.  Eliquis resumed 02/11/2021 12.  Hypertension.  Imdur 30 mg daily, Demadex 60 mg daily   Vitals:   02/13/21 1958 02/14/21 0353  BP: (!) 153/54 (!) 158/53  Pulse: 60 62  Resp: 18 19  Temp: 97.7 F (36.5 C) 97.8 F (36.6 C)  SpO2: 96% 99%  Also on Hydralazine 100mg  TID, will increase Imdur to 60mg  qd  13.  Chronic diastolic congestive heart failure.  Continue Demadex 60 mg daily.  Monitor for any signs of fluid overload  -oxygen prn, wean to off [facility] Weights   02/11/21 1549  Weight: 103.4 kg    14.  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  Continue eyedrops. 15.  CAD with history of stenting.  Continue Imdur.   No chest pain or shortness of breath    LOS: 3 days A FACE TO FACE EVALUATION WAS PERFORMED  Charlett Blake 02/14/2021, 9:58 AM

## 2021-02-14 NOTE — Progress Notes (Signed)
Partner called, said is an hour away and meal tray was just dropped off. Said that pt is supposed to have assistance with meals and tray was just dropped off. Apologized and said that I would check into it. When I checked door pt is set up with intermittent. Pt on phone with partner, asked to speak to her, pt cussing stating that cant use his right hand to eat. Informed partner via phone that he is now intermittent supv and we are just setting up, wanted to talk to someone else, informed that will have to disc with speech.

## 2021-02-14 NOTE — Progress Notes (Signed)
Physical Therapy Session Note  Patient Details  Name: Kyle Roberson MRN: [id number] Date of Birth: 1940-08-12  Today's Date: 02/14/2021 PT Individual Time: 1011-1100 PT Individual Time Calculation (min): 49 min  Missed time 11 min. Short Term Goals: Week 1:  PT Short Term Goal 1 (Week 1): Pt will perform bed mobility with minA. PT Short Term Goal 2 (Week 1): Pt will perform bed to chair transfer with CGA. PT Short Term Goal 3 (Week 1): Pt will ambulate x150' with LRAD.  Skilled Therapeutic Interventions/Progress Updates: Pt presents semi-reclined in bed and agreeable to therapy.  Pt states has had problems w/ bowels today and requested staying in room.  Pt transfers sup to sit w/ CGA to EOB and then sit to stand w/ min A and SPT w/o AD to w/c.  Pt performed [employer] w/ a score of 27/56, indicating increased risk of falls. Discussed findings and score w/ pt and understands increased risk of falls.  Pt amb w/o AD 150' and min A.  Pt amb w/ wide BOS, but states that this is normal.  Pt amb 150' back to room.  Pt states need for use of BR.  Pt amb into BR w/ min A and verbal cueing for ramp.  Pt required mod A for clothing management.  Pt continent of bowel and bladder in toilet, supervision for cleaning self.  Pt amb to sink to wash hands w/ CGA.  Pt remained sitting up in w/c w/ seat alarm on and all needs in reach.  NT to chart on Bowel and bladder.     Therapy Documentation Precautions:  Precautions Precautions: Fall Precaution Comments: Maintain BP <160; HOB >30 degrees Restrictions Weight Bearing Restrictions: No General: PT Amount of Missed Time (min): 11 Minutes PT Missed Treatment Reason: Nursing care Vital Signs:  Pain:0/10   Mobility:   Locomotion :    Trunk/Postural Assessment :    Balance: Balance Balance Assessed: Yes Standardized Balance Assessment Standardized Balance Assessment: Berg Balance Test Berg Balance Test Sit to Stand: Able to stand using  hands after several tries Standing Unsupported: Able to stand 2 minutes with supervision Sitting with Back Unsupported but Feet Supported on Floor or Stool: Able to sit safely and securely 2 minutes Stand to Sit: Controls descent by using hands Transfers: Needs one person to assist Standing Unsupported with Eyes Closed: Able to stand 10 seconds with supervision Standing Ubsupported with Feet Together: Needs help to attain position and unable to hold for 15 seconds From Standing, Reach Forward with Outstretched Arm: Can reach forward >5 cm safely (2") From Standing Position, Pick up Object from Floor: Able to pick up shoe, needs supervision From Standing Position, Turn to Look Behind Over each Shoulder: Turn sideways only but maintains balance Turn 360 Degrees: Able to turn 360 degrees safely but slowly Standing Unsupported, Alternately Place Feet on Step/Stool: Needs assistance to keep from falling or unable to try Standing Unsupported, One Foot in Front: Needs help to step but can hold 15 seconds Standing on One Leg: Tries to lift leg/unable to hold 3 seconds but remains standing independently Total Score: 27 Exercises:   Other Treatments:      Therapy/Group: Individual Therapy  Ladoris Gene 02/14/2021, 12:45 PM

## 2021-02-14 NOTE — Care Management (Signed)
Inpatient [facility] Individual Statement of Services  Patient Name:  Kyle Roberson  Date:  02/14/2021  Welcome to the [facility].  Our goal is to provide you with an individualized program based on your diagnosis and situation, designed to meet your specific needs.  With this comprehensive rehabilitation program, you will be expected to participate in at least 3 hours of rehabilitation therapies Monday-Friday, with modified therapy programming on the weekends.  Your rehabilitation program will include the following services:  Physical Therapy (PT), Occupational Therapy (OT), Speech Therapy (ST), 24 hour per day rehabilitation nursing, Therapeutic Recreaction (TR), Psychology, Neuropsychology, Care Coordinator, Rehabilitation Medicine, [place], and Other  Weekly team conferences will be held on Tuesdays to discuss your progress.  Your Inpatient Rehabilitation Care Coordinator will talk with you frequently to get your input and to update you on team discussions.  Team conferences with you and your family in attendance may also be held.  Expected length of stay: 12-14 days  Overall anticipated outcome: Supervision to Minimal Assistance  Depending on your progress and recovery, your program may change. Your Inpatient Rehabilitation Care Coordinator will coordinate services and will keep you informed of any changes. Your Inpatient Rehabilitation Care Coordinator's name and contact numbers are listed  below.  The following services may also be recommended but are not provided by the [facility] will be made to provide these services after discharge if needed.  Arrangements include referral to agencies that provide these services.  Your insurance has been verified to be:  [employer]  Your primary doctor is:  Halina Maidens  Pertinent information will be shared with your doctor and your insurance company.  Inpatient Rehabilitation Care Coordinator:  Cathleen Corti [phone] or ([phone]  Information discussed with and copy given to patient by: Rana Snare, 02/14/2021, 12:39 PM

## 2021-02-14 NOTE — Progress Notes (Signed)
Physical Therapy Session Note  Patient Details  Name: Kyle Roberson MRN: [id number] Date of Birth: 1941-02-05  Today's Date: 02/14/2021 PT Individual Time: 1330-1415 PT Individual Time Calculation (min): 45 min   Short Term Goals: Week 1:  PT Short Term Goal 1 (Week 1): Pt will perform bed mobility with minA. PT Short Term Goal 2 (Week 1): Pt will perform bed to chair transfer with CGA. PT Short Term Goal 3 (Week 1): Pt will ambulate x150' with LRAD.  Skilled Therapeutic Interventions/Progress Updates:     Pt received seated in [facility] and agrees to therapy. No complaint of pain but reports having "bad day" due to "bowel incident" earlier this morning. Pt performs sit to stand with minA and cues for safe hand placement. Pt ambulates x100' with minA and no AD, with cues for increased stride length to decrease risk for falls. Pt completes Nustep for strength and endurance training, as well as reciprocal coordination. Pt completes x10:00 at workload of 4 with average steps per minute ~55.  PT explains 6 minute walk test to pt and pt verbalizes understanding. Pt then ambulates x405' in 4:00 prior to requiring seated rest break. PT provides CGA throughout and cues for increasing stride length and upright gaze to improve balance. Following extended seated rest break, pt ambulates x150' back to room. Left seated in WC with alarm intact and all needs within reach.  Therapy Documentation Precautions:  Precautions Precautions: Fall Precaution Comments: Maintain BP <160; HOB >30 degrees Restrictions Weight Bearing Restrictions: No  Therapy/Group: Individual Therapy  Breck Coons, PT, DPT 02/14/2021, 2:25 PM

## 2021-02-15 LAB — GLUCOSE, CAPILLARY
Glucose-Capillary: 132 mg/dL — ABNORMAL HIGH (ref 70–99)
Glucose-Capillary: 161 mg/dL — ABNORMAL HIGH (ref 70–99)
Glucose-Capillary: 170 mg/dL — ABNORMAL HIGH (ref 70–99)
Glucose-Capillary: 124 mg/dL — ABNORMAL HIGH (ref 70–99)

## 2021-02-15 MED ORDER — DOCUSATE SODIUM 100 MG PO CAPS
100.0000 mg | ORAL_CAPSULE | Freq: Every day | ORAL | Status: DC
  Administered 2021-02-15 – 2021-02-21 (×5): 100 mg via ORAL
  Filled 2021-02-15 (×13): qty 1

## 2021-02-15 NOTE — Progress Notes (Signed)
Physical Therapy TBI Note  Patient Details  Name: Kyle Roberson MRN: [id number] Date of Birth: 28-Oct-1940  Today's Date: 02/15/2021 PT Individual Time: 1405-1450 PT Individual Time Calculation (min): 45 min   Short Term Goals: Week 1:  PT Short Term Goal 1 (Week 1): Pt will perform bed mobility with minA. PT Short Term Goal 2 (Week 1): Pt will perform bed to chair transfer with CGA. PT Short Term Goal 3 (Week 1): Pt will ambulate x150' with LRAD.  Skilled Therapeutic Interventions/Progress Updates:     Patient in w/c in the room upon PT arrival. Patient alert and agreeable to PT session. Patient denied pain during session. Patient requested to go to the bathroom at beginning of session, assisted patient to the bathroom, see mobility below, and patient requested increased time to attempt BM. Reports that he is constipated and was unsuccessful with BM during toileting, RN and MD made aware. Patient missed 15 min of skilled PT due to toileting, RN made aware. Will attempt to make-up missed time as able.   Therapeutic Activity: Transfers: Patient performed stand pivot w/c<>toilet with CGA, performed peri-care independently and lower body clothing management with mod A due to decreased dexterity in R hand. He performed hand hygiene w/c level at the sink without assist after. He performed sit to/from stand x4 with CGA progressing to close supervision. Provided verbal cues for scooting forward and forward weight shift.  Gait Training:  Patient ambulated 25 feet forwards and backwards x2 in front of a mirror to focus on body symmetry during gait and increased arm swing without an AD with CGA. He then ambulated 180 feet x2 and >100 feet focused on arm swing, step height L>R, and increased gait speed for reduced fall risk during gait. Required mod cues for points of focus for each activity.   Patient in w/c in the room at end of session with breaks locked, chair alarm set, and all needs within  reach.   Therapy Documentation Precautions:  Precautions Precautions: Fall Precaution Comments: Maintain BP <160; HOB >30 degrees Restrictions Weight Bearing Restrictions: No General: PT Amount of Missed Time (min): 15 Minutes PT Missed Treatment Reason: Toileting Agitated Behavior Scale: TBI Observation Details Observation Environment: [address] Start of observation period - Date: 02/15/21 Start of observation period - Time: 1405 End of observation period - Date: 02/15/21 End of observation period - Time: 1450 Agitated Behavior Scale (DO NOT LEAVE BLANKS) Short attention span, easy distractibility, inability to concentrate: Present to a slight degree Impulsive, impatient, low tolerance for pain or frustration: Absent Uncooperative, resistant to care, demanding: Absent Violent and/or threatening violence toward people or property: Absent Explosive and/or unpredictable anger: Absent Rocking, rubbing, moaning, or other self-stimulating behavior: Absent Pulling at tubes, restraints, etc.: Absent Wandering from treatment areas: Absent Restlessness, pacing, excessive movement: Absent Repetitive behaviors, motor, and/or verbal: Absent Rapid, loud, or excessive talking: Absent Sudden changes of mood: Absent Easily initiated or excessive crying and/or laughter: Absent Self-abusiveness, physical and/or verbal: Absent Agitated behavior scale total score: 15    Therapy/Group: Individual Therapy  Beya Tipps L Kreed Kauffman PT, DPT  02/15/2021, 3:58 PM

## 2021-02-15 NOTE — Progress Notes (Signed)
Speech Language Pathology TBI Note  Patient Details  Name: Kyle Roberson MRN: [id number] Date of Birth: [birth year]  Today's Date: 02/15/2021 SLP Individual Time: 0805-0900 SLP Individual Time Calculation (min): 55 min  Short Term Goals: Week 1: SLP Short Term Goal 1 (Week 1): Patient will demonstrate functional problem solving for mildly complex tasks with supervision level verbal cues. SLP Short Term Goal 2 (Week 1): Patient will demonstrate selective attention to functional tasks in a mildly distracting enviornment for 30 minutes with Min verbal cues for redirection. SLP Short Term Goal 3 (Week 1): Patient will recall new, daily information with supervision level verbal cues.  Skilled Therapeutic Interventions: Pt seen for skilled ST with focus on cognitive goals, pt initially mildly agitated but agreeable to therapeutic tasks. Pt reports a change in his speech from last night, noting increased word finding difficulty and phonemic paraphasia, and blames it on "nurse from last night, she got me all worked up". Pt provided strategies to reduce frustration with expressive language difficulties this date, benefiting from overall min cues to utilize at conversation level. SLP facilitating functional problem solving/safety activity for home environment by providing SPV level verbal cues for 100% accuracy. Pt would often answer problem solving questions in joking manner initially, cues for more appropriate response effective 100%. Pt observed during AM med pass, independent for asking what medications he was taking and why. However pt becoming quickly agitated and raising voice with nurse, refusing certain medications, verbal cues minimally effective to redirect. Pt left in bed with alarm set and all needs within reach. Cont ST POC.   Pain Pain Assessment Pain Scale: 0-10 Pain Score: 0-No pain  Agitated Behavior Scale: TBI Observation Details Observation Environment: pt room Start of  observation period - Date: 02/15/21 Start of observation period - Time: 0800 End of observation period - Date: 02/15/21 End of observation period - Time: 0900 Agitated Behavior Scale (DO NOT LEAVE BLANKS) Short attention span, easy distractibility, inability to concentrate: Present to a slight degree Impulsive, impatient, low tolerance for pain or frustration: Present to a slight degree Uncooperative, resistant to care, demanding: Present to a slight degree Violent and/or threatening violence toward people or property: Absent Explosive and/or unpredictable anger: Present to a slight degree Rocking, rubbing, moaning, or other self-stimulating behavior: Absent Pulling at tubes, restraints, etc.: Absent Wandering from treatment areas: Absent Restlessness, pacing, excessive movement: Absent Repetitive behaviors, motor, and/or verbal: Absent Rapid, loud, or excessive talking: Absent Sudden changes of mood: Present to a slight degree Easily initiated or excessive crying and/or laughter: Absent Self-abusiveness, physical and/or verbal: Absent Agitated behavior scale total score: 19  Therapy/Group: Individual Therapy  Dewaine Conger 02/15/2021, 9:39 AM

## 2021-02-15 NOTE — Progress Notes (Signed)
PROGRESS NOTE   Subjective/Complaints: Feeling "blocked-up." Nurse has gone to get him a stool softener. Satting 96% on room air, denies shortness of breath Sleeping well at night  ROS: Patient denies CP, SOB, N/V/D, +constipation  Objective:   No results found. No results for input(s): WBC, HGB, HCT, PLT in the last 72 hours. No results for input(s): NA, K, CL, CO2, GLUCOSE, BUN, CREATININE, CALCIUM in the last 72 hours.  Intake/Output Summary (Last 24 hours) at 02/15/2021 1547 Last data filed at 02/15/2021 0923 Gross per 24 hour  Intake 120 ml  Output 950 ml  Net -830 ml        Physical Exam: Vital Signs Blood pressure (!) 132/55, pulse (!) 55, temperature 97.7 F (36.5 C), resp. rate 18, height 5\' 8"  (1.727 m), weight 103.4 kg, SpO2 93 %.  General: No acute distress Mood and affect are appropriate Heart: Bradycardia Lungs: Clear to auscultation, breathing unlabored, no rales or wheezes Abdomen: Positive bowel sounds, soft nontender to palpation, nondistended Extremities: No clubbing, cyanosis, or edema Skin: No evidence of breakdown, no evidence of rash  Musculoskeletal: Full range of motion in all 4 extremities. No joint swelling  Neuro:  Pt alert. Follows basic commands. Decreased visual acuity. Delayed processing fair insight and memory. RUE4/5 prox to distal. LUE 5-/5. RLE 4/5 prox to distal, LLE 4+ to 5-/5. No focal sensory findings. DTR's 1+. No abnl tone    Assessment/Plan: 1. Functional deficits which require 3+ hours per day of interdisciplinary therapy in a comprehensive inpatient rehab setting. Physiatrist is providing close team supervision and 24 hour management of active medical problems listed below. Physiatrist and rehab team continue to assess barriers to discharge/monitor patient progress toward functional and medical goals  Care Tool:  Bathing    Body parts bathed by patient: Right  arm, Left arm, Chest, Abdomen (UB only this am)   Body parts bathed by helper: Left arm, Buttocks, Right lower leg, Left lower leg     Bathing assist Assist Level: Supervision/Verbal cueing     Upper Body Dressing/Undressing Upper body dressing   What is the patient wearing?: Pull over shirt    Upper body assist Assist Level: Minimal Assistance - Patient > 75%    Lower Body Dressing/Undressing Lower body dressing      What is the patient wearing?: Underwear/pull up, Pants     Lower body assist Assist for lower body dressing: Moderate Assistance - Patient 50 - 74%     Toileting Toileting    Toileting assist Assist for toileting: Moderate Assistance - Patient 50 - 74%     Transfers Chair/bed transfer  Transfers assist     Chair/bed transfer assist level: Minimal Assistance - Patient > 75%     Locomotion Ambulation   Ambulation assist      Assist level: Minimal Assistance - Patient > 75% Assistive device: No Device Max distance: 150   Walk 10 feet activity   Assist     Assist level: Minimal Assistance - Patient > 75% Assistive device: No Device   Walk 50 feet activity   Assist    Assist level: Minimal Assistance - Patient > 75% Assistive device: No  Device    Walk 150 feet activity   Assist    Assist level: Minimal Assistance - Patient > 75% Assistive device: No Device    Walk 10 feet on uneven surface  activity   Assist     Assist level: Minimal Assistance - Patient > 75%     Wheelchair     Assist Is the patient using a wheelchair?: No             Wheelchair 50 feet with 2 turns activity    Assist            Wheelchair 150 feet activity     Assist          Blood pressure (!) 132/55, pulse (!) 55, temperature 97.7 F (36.5 C), resp. rate 18, height 5\' 8"  (1.727 m), weight 103.4 kg, SpO2 93 %.  Medical Problem List and Plan: 1.  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  Status post craniotomy evacuation 02/05/2021             -patient may not shower unless covers hair/head             -ELOS/Goals: 12-14 days- supervision  Continue CIR 2.  Antithrombotics: -DVT/anticoagulation:  Pharmaceutical: Other (comment) Eliquis             -antiplatelet therapy: N/A 3. Pain: continue Fioricet, oxycodone 4. Mood: Provide emotional support  -monitor interactions with staff             -antipsychotic agents: N/A 5. Neuropsych: This patient is capable of making decisions on his own behalf. 6. Skin/Wound Care: Routine skin checks 7. Fluids/Electrolytes/Nutrition: Routine in and out with follow-up chemistry  I personally reviewed the patient's labs today.    -low albumin---daily protein supp  -encourage PO 8.  Diabetes mellitus peripheral neuropathy.  Hemoglobin A1c 7.3.  NovoLog 3 units 3 times daily with meals, Semglee 10 units daily.     CBG (last 3)  Recent Labs    02/14/21 2102 02/15/21 0603 02/15/21 1140  GLUCAP 181* 132* 161*  Controlled 11/25 in am but elevated during the day 11/24, increase novalog to 4U  9.  CKD stage IV.  Continue torsemide for now, Zaroxolyn on hold.  -Cr in range of 1.9-2.1- stable  10.  Obesity.  BMI 34.97.  Dietary follow-up 11.  Chronic atrial fibrillation.  Cardiac rate controlled.  Eliquis resumed 02/11/2021 12.  Hypertension.  Imdur 30 mg daily, Demadex 60 mg daily   Vitals:   02/15/21 0454 02/15/21 1510  BP: (!) 158/62 (!) 132/55  Pulse: (!) 50 (!) 55  Resp: 17 18  Temp: 98 F (36.7 C) 97.7 F (36.5 C)  SpO2: 97% 93%  Also on Hydralazine 100mg  TID, will increase Imdur to 60mg  qd.   13.  Chronic diastolic congestive heart failure.  Continue Demadex 60 mg daily.  Monitor for any signs of fluid overload  -oxygen prn, wean to off [facility] Weights   02/11/21 1549  Weight: 103.4 kg    14.  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  Continue eyedrops. 15.  CAD with history of  stenting.  Continue Imdur.  No chest pain or shortness of breath 16. Constipation: add colace 100mg  daily.  17. Bradycardia: continue to monitor, will not decrease medications given elevated SBP    LOS: 4 days A FACE TO FACE EVALUATION WAS PERFORMED  Clide Deutscher Atiana Levier 02/15/2021, 3:47 PM

## 2021-02-15 NOTE — Progress Notes (Signed)
Occupational Therapy Session Note  Patient Details  Name: Kyle Roberson MRN: [id number] Date of Birth: 06/04/1940  Today's Date: 02/16/2021 OT Individual Time: 6010-9323 OT Individual Time Calculation (min): 57 min   Short Term Goals: Week 1:  OT Short Term Goal 1 (Week 1): Patient will complete toilet transfers with Min A and LRAD. OT Short Term Goal 2 (Week 1): Patient will complete 3/3 parts of toileting task with Min A, AE and LRAD. OT Short Term Goal 3 (Week 1): Patient will complete LB dressing with Min A and LRAD.  Skilled Therapeutic Interventions/Progress Updates:    Pt greeted in bed, appearing in bright spirits, no c/o pain. Declining shower, did not want to start session by donning clean clothes or toileting. Agreeable to start with oral care. Setup for supine<sit with HOB elevated using the bedrail and then pt ambulated to the sink with CGA without AD. Vcs for using his affected Rt hand to reach for needed items vs overcompensate with the Lt hand during this ADL task. Afterwards pt ambulated to the parked w/c. Escorted him via w/c to the dayroom and worked on [place] strengthening/coordination via multiple activities. Started with [employer] exercise bike pt performing forward/backward rotations x3 minutes each direction. Transitioned to seated and standing BITS activities focusing on memory and visual scanning, pt using his Rt hand to meet task demands given cuing as he would try to use his unaffected Lt hand. CGA provided when standing. Pt also completed a simulated walk-in shower transfer without AD and CGA. Pt was then returned to the room and completed an ambulatory toilet transfer with CGA, pt with +bladder void. After handwashing at the sink, pt transferred to the w/c. Left him sitting up with all needs within reach and safety belt fastened.   Therapy Documentation Precautions:  Precautions Precautions: Fall Precaution Comments: Maintain BP <160; HOB >30  degrees Restrictions Weight Bearing Restrictions: No Pain: Pain Assessment Pain Scale: 0-10 Pain Score: 0-No pain ADL: ADL Eating: Not assessed Grooming: Setup Where Assessed-Grooming: Sitting at sink Upper Body Bathing: Setup Where Assessed-Upper Body Bathing: Sitting at sink Lower Body Bathing: Moderate assistance Where Assessed-Lower Body Bathing: Standing at sink Upper Body Dressing: Supervision/safety, Minimal cueing Where Assessed-Upper Body Dressing: Sitting at sink Lower Body Dressing: Moderate assistance Where Assessed-Lower Body Dressing: Standing at sink Toileting: Moderate assistance Where Assessed-Toileting:  (Urinal) Toilet Transfer Method: Not assessed Walk-In Shower Transfer: Not assessed ADL Comments: Supervision to set-up UB ADLs; Mod A LB ADLs and toileting  Therapy/Group: Individual Therapy  Khiana Camino A Moselle Rister 02/16/2021, 12:46 PM

## 2021-02-15 NOTE — Progress Notes (Signed)
Occupational Therapy TBI Note  Patient Details  Name: Kyle Roberson MRN: [id number] Date of Birth: [birth year]  Today's Date: 02/15/2021 OT Individual Time: 1100-1200 OT Individual Time Calculation (min): 60 min    Short Term Goals: Week 1:  OT Short Term Goal 1 (Week 1): Patient will complete toilet transfers with Min A and LRAD. OT Short Term Goal 2 (Week 1): Patient will complete 3/3 parts of toileting task with Min A, AE and LRAD. OT Short Term Goal 3 (Week 1): Patient will complete LB dressing with Min A and LRAD.    Skilled Therapeutic Interventions/Progress Updates:    Pt received in bed, awake and agreeable to therapy.   Pt seen this session to focus on RUE coordination, balance, communication and attention.  Offered pt a shower but he did not want to do that, but did agree to washing UB with some convincing.  He also only wanted to wear scrub pants as he was nervous about having an accident in his sweat pants. He told me that happened the other day.  Pt sat to EOB with S. Mod A to don pants over feet and min over hips as he was standing. Stood from EOB with CGA, then after pants on, stepped to wc.  A with shoes.     Pt placed at sink so therapist could A pt with washing hair around suture site. Pt washed UB, dried off with cues for thoroughness.  Brushed teeth with set up using R hand well to manage toothbrush. He donned shirt over arms and head with no cues, but needed A to fully pull over chest. He had difficulty using R hand to grasp shirt to pull it down.    From w/c worked on R hand coordination exercises of moving B hands together in movement sequences of flipping hands up and down on thighs. He could do the first 8 reps or so but then would lose attention to R hand and stop moving it while left was still doing the activity.  Reassessed sensation, pt has tactile touch to feel a washcloth in his hand or a tissue.  With eyes closed, placed pt's L arm in a position and he  had to match it with his R arm and he did well with this activity.   Continued coordination exercises with folding paper, grasping and placing red resistive clothes pins with occasional cues for correct finger placement (using tip or later pinch vs full grasp).  Pt participated well, no agitation this session.    Pt resting in  w/c, chair alarm on with all needs met.    Therapy Documentation Precautions:  Precautions Precautions: Fall Precaution Comments: Maintain BP <160; HOB >30 degrees Restrictions Weight Bearing Restrictions: No    Pain: Pain Assessment Pain Scale: 0-10 Pain Score: 0-No pain Agitated Behavior Scale: TBI Observation Details Observation Environment: pt room Start of observation period - Date: 02/15/21 Start of observation period - Time: 1100 End of observation period - Date: 02/15/21 End of observation period - Time: 1200 Agitated Behavior Scale (DO NOT LEAVE BLANKS) Short attention span, easy distractibility, inability to concentrate: Present to a slight degree Impulsive, impatient, low tolerance for pain or frustration: Absent Uncooperative, resistant to care, demanding: Absent Violent and/or threatening violence toward people or property: Absent Explosive and/or unpredictable anger: Absent Rocking, rubbing, moaning, or other self-stimulating behavior: Absent Pulling at tubes, restraints, etc.: Absent Wandering from treatment areas: Absent Restlessness, pacing, excessive movement: Absent Repetitive behaviors, motor, and/or verbal: Absent Rapid, loud,  or excessive talking: Absent Sudden changes of mood: Absent Easily initiated or excessive crying and/or laughter: Absent Self-abusiveness, physical and/or verbal: Absent Agitated behavior scale total score: 15    Therapy/Group: Individual Therapy  Merline Perkin 02/15/2021, 12:38 PM

## 2021-02-16 LAB — CBC
HCT: 36.8 % — ABNORMAL LOW (ref 39.0–52.0)
Hemoglobin: 12 g/dL — ABNORMAL LOW (ref 13.0–17.0)
MCH: 30.2 pg (ref 26.0–34.0)
MCHC: 32.6 g/dL (ref 30.0–36.0)
MCV: 92.5 fL (ref 80.0–100.0)
Platelets: 231 10*3/uL (ref 150–400)
RBC: 3.98 MIL/uL — ABNORMAL LOW (ref 4.22–5.81)
RDW: 13.2 % (ref 11.5–15.5)
WBC: 7.2 10*3/uL (ref 4.0–10.5)
nRBC: 0 % (ref 0.0–0.2)

## 2021-02-16 LAB — BASIC METABOLIC PANEL
Anion gap: 9 (ref 5–15)
BUN: 41 mg/dL — ABNORMAL HIGH (ref 8–23)
CO2: 29 mmol/L (ref 22–32)
Calcium: 8.7 mg/dL — ABNORMAL LOW (ref 8.9–10.3)
Chloride: 100 mmol/L (ref 98–111)
Creatinine, Ser: 2 mg/dL — ABNORMAL HIGH (ref 0.61–1.24)
GFR, Estimated: 33 mL/min — ABNORMAL LOW (ref >60)
Glucose, Bld: 126 mg/dL — ABNORMAL HIGH (ref 70–99)
Potassium: 3.9 mmol/L (ref 3.5–5.1)
Sodium: 138 mmol/L (ref 135–145)

## 2021-02-16 LAB — GLUCOSE, CAPILLARY
Glucose-Capillary: 124 mg/dL — ABNORMAL HIGH (ref 70–99)
Glucose-Capillary: 131 mg/dL — ABNORMAL HIGH (ref 70–99)
Glucose-Capillary: 103 mg/dL — ABNORMAL HIGH (ref 70–99)
Glucose-Capillary: 235 mg/dL — ABNORMAL HIGH (ref 70–99)

## 2021-02-16 NOTE — Progress Notes (Signed)
Physical Therapy TBI Note  Patient Details  Name: Kyle Roberson MRN: [id number] Date of Birth: [birth year]  Today's Date: 02/16/2021 PT Individual Time: 1300-1400 PT Individual Time Calculation (min): 60 min   Short Term Goals: Week 1:  PT Short Term Goal 1 (Week 1): Pt will perform bed mobility with minA. PT Short Term Goal 2 (Week 1): Pt will perform bed to chair transfer with CGA. PT Short Term Goal 3 (Week 1): Pt will ambulate x150' with LRAD.  Skilled Therapeutic Interventions/Progress Updates:  Pt received sitting in wc.  He denied pain. TEDS already donned.  neuromuscular re-education via forced use, and multimodal cues for alternating reciprocal movement bil LEs using Kinetron in sitting, x 1 minute targeting quadriceps muscles, x 3 minutes targeting gluteal muscles, at resistance 40 cm/sec .  Unilaterally used RLE against resistance of PT from other foot plate, x 10 cycles  x 1, x 15 cycles x 2. Biased to R standing x 15 minutes during puzzle activity at sturdy table in front of him, using PVC puzzles.  Cues to increase use of R hand, visually follow it, with errors in choosing pieces 3/21 pieces.  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   Sit> stand with close supervision.  Gait training on level tile, no AD, x 250' with CGA multiple turns. Advanced gait training , backwards x 25' with CGA.    At end of session, pt seated in wc with needs at hand and seat belt alarm set.  Gait belt used to secure ELRs as L could not be locked.  LPN aware.  Pt distracted due to verbosity.      Therapy Documentation Precautions:  Precautions Precautions: Fall Precaution Comments: Maintain BP <160; HOB >30 degrees Restrictions Weight Bearing Restrictions: No   Agitated Behavior Scale: TBI Observation Details Observation Environment: 4 W Start of observation period - Date: 02/16/21 Start of observation period - Time: 1300 End of observation  period - Date: 02/16/21 End of observation period - Time: 1400 Agitated Behavior Scale (DO NOT LEAVE BLANKS) Short attention span, easy distractibility, inability to concentrate: Present to a slight degree Impulsive, impatient, low tolerance for pain or frustration: Absent Uncooperative, resistant to care, demanding: Absent Violent and/or threatening violence toward people or property: Absent Explosive and/or unpredictable anger: Absent Rocking, rubbing, moaning, or other self-stimulating behavior: Absent Pulling at tubes, restraints, etc.: Absent Wandering from treatment areas: Absent Restlessness, pacing, excessive movement: Absent Repetitive behaviors, motor, and/or verbal: Absent Rapid, loud, or excessive talking: Absent Sudden changes of mood: Absent Easily initiated or excessive crying and/or laughter: Absent Self-abusiveness, physical and/or verbal: Absent Agitated behavior scale total score: 15      Therapy/Group: Individual Therapy  Burke Terry 02/16/2021, 2:22 PM

## 2021-02-16 NOTE — Progress Notes (Signed)
Physical Therapy TBI Note  Patient Details  Name: Kyle Roberson MRN: [id number] Date of Birth: 1941/02/24  Today's Date: 02/16/2021 PT Individual Time: 1105-1205 PT Individual Time Calculation (min): 60 min   Short Term Goals: Week 1:  PT Short Term Goal 1 (Week 1): Pt will perform bed mobility with minA. PT Short Term Goal 2 (Week 1): Pt will perform bed to chair transfer with CGA. PT Short Term Goal 3 (Week 1): Pt will ambulate x150' with LRAD.  Skilled Therapeutic Interventions/Progress Updates:     Patient in w/c in the room upon PT arrival. Patient alert and agreeable to PT session. Patient denied pain during session. Noted increased B lower extremity edema with pitting from socks, RN made aware and agreeable to TED hose placement, per orders. Donned B TED hose with total A at beginning of session for edema management. Patient did report increased fatigue today, but denied light headedness, dizziness, SOB, or other symptoms throughout session. HR 45-55 bpm at rest and 60-70 bpm with activity consistently throughout session.   Therapeutic Activity: Transfers: Patient performed sit to/from stand from the w/c, an arm chair x2, and the toilet with CGA without AD. Provided verbal cues for forward weight shift x2.   Gait Training:  Patient ambulated 173 feet x2 without an AD with CGA focused on arm swing, step height L>R, and increased gait speed for reduced fall risk during gait. Required mod cues for points of focus for each trial.  Patient ascended/descended 4x6" steps using B rails with CGA. Performed step-to progressing to reciprocal gait pattern. Provided cues for technique and sequencing.   Neuromuscular Re-ed: Patient performed the following standing balance activities: -5 time sit to stand with arms across chest without timed component to focus on lower extremity motor control with eccentric contraction x2 sets  Patient required increased time and rest breaks with all  activities due to decreased activity tolerance. Provided education on energy conservation and symptoms of TBI, with focus on fatigue management during rest breaks.   Patient in w/c with B ELRs for edema control at end of session with breaks locked, seat belt alarm set, and all needs within reach.   Therapy Documentation Precautions:  Precautions Precautions: Fall Precaution Comments: Maintain BP <160; HOB >30 degrees Restrictions Weight Bearing Restrictions: No Agitated Behavior Scale: TBI Observation Details Observation Environment: 4 W Start of observation period - Date: 02/16/21 Start of observation period - Time: 1105 End of observation period - Date: 02/16/21 End of observation period - Time: 1205 Agitated Behavior Scale (DO NOT LEAVE BLANKS) Short attention span, easy distractibility, inability to concentrate: Present to a slight degree Impulsive, impatient, low tolerance for pain or frustration: Absent Uncooperative, resistant to care, demanding: Absent Violent and/or threatening violence toward people or property: Absent Explosive and/or unpredictable anger: Absent Rocking, rubbing, moaning, or other self-stimulating behavior: Absent Pulling at tubes, restraints, etc.: Absent Wandering from treatment areas: Absent Restlessness, pacing, excessive movement: Absent Repetitive behaviors, motor, and/or verbal: Absent Rapid, loud, or excessive talking: Absent Sudden changes of mood: Absent Easily initiated or excessive crying and/or laughter: Absent Self-abusiveness, physical and/or verbal: Absent Agitated behavior scale total score: 15    Therapy/Group: Individual Therapy  Shi Blankenship L Laurelai Lepp PT, DPT  02/16/2021, 7:58 PM

## 2021-02-17 ENCOUNTER — Telehealth: Payer: Self-pay | Admitting: Cardiovascular Disease

## 2021-02-17 DIAGNOSIS — E1169 Type 2 diabetes mellitus with other specified complication: Secondary | ICD-10-CM

## 2021-02-17 DIAGNOSIS — E669 Obesity, unspecified: Secondary | ICD-10-CM

## 2021-02-17 LAB — GLUCOSE, CAPILLARY
Glucose-Capillary: 152 mg/dL — ABNORMAL HIGH (ref 70–99)
Glucose-Capillary: 124 mg/dL — ABNORMAL HIGH (ref 70–99)
Glucose-Capillary: 185 mg/dL — ABNORMAL HIGH (ref 70–99)
Glucose-Capillary: 167 mg/dL — ABNORMAL HIGH (ref 70–99)

## 2021-02-17 NOTE — Progress Notes (Signed)
PROGRESS NOTE   Subjective/Complaints: Slept pretty well. SO asked if he could have oxygen scheduled at night for his sleep apnea and decreased O2 sats.   ROS: Patient denies fever, rash, sore throat, blurred vision, nausea, vomiting, diarrhea, cough, shortness of breath or chest pain, joint or back pain, headache, or mood change.   Objective:   No results found. Recent Labs    02/16/21 0605  WBC 7.2  HGB 12.0*  HCT 36.8*  PLT 231   Recent Labs    02/16/21 0605  NA 138  K 3.9  CL 100  CO2 29  GLUCOSE 126*  BUN 41*  CREATININE 2.00*  CALCIUM 8.7*    Intake/Output Summary (Last 24 hours) at 02/17/2021 1122 Last data filed at 02/17/2021 0949 Gross per 24 hour  Intake 478 ml  Output 1000 ml  Net -522 ml        Physical Exam: Vital Signs Blood pressure (!) 172/58, pulse (!) 43, temperature 97.9 F (36.6 C), temperature source Oral, resp. rate 16, height 5\' 8"  (1.727 m), weight 103.4 kg, SpO2 98 %.  Constitutional: No distress . Vital signs reviewed. HEENT: NCAT, EOMI, oral membranes moist Neck: supple Cardiovascular: RRR without murmur. No JVD    Respiratory/Chest: CTA Bilaterally without wheezes or rales. Normal effort    GI/Abdomen: BS +, non-tender, non-distended Ext: no clubbing, cyanosis, or edema Psych: pleasant and cooperative  Skin: No evidence of breakdown, no evidence of rash. Chronic venous changes BLE Scalp staples CDI Musculoskeletal: Full range of motion in all 4 extremities. No joint swelling Neuro:  Pt alert. Follows basic commands. Decreased visual acuity. Displays fair insight and memory. RUE4/5 prox to distal. LUE 5-/5. RLE 4/5 prox to distal, LLE 4+ to 5-/5. No focal sensory findings. DTR's 1+. No abnl tone    Assessment/Plan: 1. Functional deficits which require 3+ hours per day of interdisciplinary therapy in a comprehensive inpatient rehab setting. Physiatrist is providing close  team supervision and 24 hour management of active medical problems listed below. Physiatrist and rehab team continue to assess barriers to discharge/monitor patient progress toward functional and medical goals  Care Tool:  Bathing    Body parts bathed by patient: Right arm, Left arm, Chest, Abdomen (UB only this am)   Body parts bathed by helper: Left arm, Buttocks, Right lower leg, Left lower leg     Bathing assist Assist Level: Supervision/Verbal cueing     Upper Body Dressing/Undressing Upper body dressing   What is the patient wearing?: Pull over shirt    Upper body assist Assist Level: Minimal Assistance - Patient > 75%    Lower Body Dressing/Undressing Lower body dressing      What is the patient wearing?: Underwear/pull up, Pants     Lower body assist Assist for lower body dressing: Moderate Assistance - Patient 50 - 74%     Toileting Toileting    Toileting assist Assist for toileting: Contact Guard/Touching assist     Transfers Chair/bed transfer  Transfers assist     Chair/bed transfer assist level: Minimal Assistance - Patient > 75%     Locomotion Ambulation   Ambulation assist      Assist  level: Contact Guard/Touching assist Assistive device: No Device Max distance: 150   Walk 10 feet activity   Assist     Assist level: Contact Guard/Touching assist Assistive device: No Device   Walk 50 feet activity   Assist    Assist level: Contact Guard/Touching assist Assistive device: No Device    Walk 150 feet activity   Assist    Assist level: Contact Guard/Touching assist Assistive device: No Device    Walk 10 feet on uneven surface  activity   Assist     Assist level: Minimal Assistance - Patient > 75%     Wheelchair     Assist Is the patient using a wheelchair?: No             Wheelchair 50 feet with 2 turns activity    Assist            Wheelchair 150 feet activity     Assist           Blood pressure (!) 172/58, pulse (!) 43, temperature 97.9 F (36.6 C), temperature source Oral, resp. rate 16, height 5\' 8"  (1.727 m), weight 103.4 kg, SpO2 98 %.  Medical Problem List and Plan: 1.  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  Status post craniotomy evacuation 02/05/2021             -patient may not shower unless covers hair/head             -ELOS/Goals: 12-14 days- supervision  -Continue CIR therapies including PT, OT, and SLP  2.  Antithrombotics: -DVT/anticoagulation:  Pharmaceutical: Other (comment) Eliquis             -antiplatelet therapy: N/A 3. Pain: continue Fioricet, oxycodone 4. Mood: Provide emotional support  -monitor interactions with staff             -antipsychotic agents: N/A 5. Neuropsych: This patient is capable of making decisions on his own behalf. 6. Skin/Wound Care: remove staples tomorrow 7. Fluids/Electrolytes/Nutrition: Routine in and out with follow-up chemistry  -low albumin---daily protein supp  -encourage PO 8.  Diabetes mellitus peripheral neuropathy.  Hemoglobin A1c 7.3.  NovoLog 3 units 4 times daily with meals, Semglee 10 units daily.     CBG (last 3)  Recent Labs    02/16/21 1642 02/16/21 2102 02/17/21 0610  GLUCAP 103* 235* 152*  Inconsistent control but improving. No further changes today  9.  CKD stage IV.  Continue torsemide for now, Zaroxolyn on hold.  -Cr in range of 1.9-2.1- stable  10.  Obesity.  BMI 34.97.  Dietary follow-up 11.  Chronic atrial fibrillation.  Cardiac rate controlled.  Eliquis resumed 02/11/2021 12.  Hypertension.  Imdur 30 mg daily, Demadex 60 mg daily   Vitals:   02/16/21 1936 02/17/21 0450  BP: (!) 149/58 (!) 172/58  Pulse: (!) 43 (!) 43  Resp: 18 16  Temp: 97.7 F (36.5 C) 97.9 F (36.6 C)  SpO2: 97% 98%  Also on Hydralazine 100mg  TID,   increased Imdur to 60mg  qd.   13.  Chronic diastolic congestive heart failure.  Continue Demadex 60 mg  daily.  Monitor for any signs of fluid overload  -will ad oxygen at HS scheduled Filed Weights   02/11/21 1549  Weight: 103.4 kg    -need weights qod 14.  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  Continue eyedrops. 15.  CAD with history of stenting.  Continue Imdur.  No chest  pain or shortness of breath 16. Constipation: added colace 100mg  daily.  17. Bradycardia: continue to monitor, will not decrease medications given elevated SBP 18. OSA: schedule oxygen, outpt sleep study    LOS: 6 days A FACE TO FACE EVALUATION WAS PERFORMED  Kyle Roberson 02/17/2021, 11:22 AM

## 2021-02-17 NOTE — Progress Notes (Signed)
Physical Therapy Session Note  Patient Details  Name: Kyle Roberson MRN: [id number] Date of Birth: 08/24/1940  Today's Date: 02/17/2021 PT Individual Time: 0810-0845 PT Individual Time Calculation (min): 35 min  Missed time 10' Short Term Goals: Week 1:  PT Short Term Goal 1 (Week 1): Pt will perform bed mobility with minA. PT Short Term Goal 2 (Week 1): Pt will perform bed to chair transfer with CGA. PT Short Term Goal 3 (Week 1): Pt will ambulate x150' with LRAD.  Skilled Therapeutic Interventions/Progress Updates: Pt presents supine in bed and agreeable to therapy.  Pt receiving meds from nurse and then MD entered for assessment.  PT donned thigh high TEDS w/ total A.  Pt transferred sup to sit using side rail and CGA.  Pt assisted w/threading pants over feet and pulled up to thighs at EOB.  Pt transferred sit to stand w/ supervision and then required min A to complete donning of pants.  Pt amb w/o AD and CGA to BR and CGA for pulling pants down and transferring onto toilet.  Pt continent of bladder in toilet, NT notified and will chart.  PT assisted w/ donning new brief (velcro broken) and then pt amb out of BR to sink.  Pt stood w/ close supervision at sink to wash hands and brush teeth w/ increased time.  Pt stepped back to w/c for seated rest.  Pt amb w/o AD into hallway w/ CGA 150' w/verbal cues for arm swing and cadence for improved placement of feet.  Pt remained sitting in w/c w/ chair alarm on and all needs in reach, spouse present.     Therapy Documentation Precautions:  Precautions Precautions: Fall Precaution Comments: Maintain BP <160; HOB >30 degrees Restrictions Weight Bearing Restrictions: No General: PT Amount of Missed Time (min): 10 Minutes PT Missed Treatment Reason: Nursing care (and MD assessment.) Vital Signs:  Pain:0/10 Pain Assessment Pain Scale: 0-10 Pain Score: 0-No pain    Therapy/Group: Individual Therapy  Ladoris Gene 02/17/2021, 8:50  AM

## 2021-02-17 NOTE — Progress Notes (Signed)
Physical Therapy Session Note  Patient Details  Name: Kyle Roberson MRN: [id number] Date of Birth: November 28, 1940  Today's Date: 02/17/2021 PT Individual Time: 1420-1530 PT Individual Time Calculation (min): 70 min   Short Term Goals: Week 1:  PT Short Term Goal 1 (Week 1): Pt will perform bed mobility with minA. PT Short Term Goal 2 (Week 1): Pt will perform bed to chair transfer with CGA. PT Short Term Goal 3 (Week 1): Pt will ambulate x150' with LRAD.  Skilled Therapeutic Interventions/Progress Updates:     Pt received seated in [facility] and agrees to therapy. No complaint of pain. WC transport outside for time management. Pt performs gait training outside for ambulation over varying and unlevel surfaces. PT provides CGA/minA and pt ambulates without AD. Pt ambulates bouts of 280' and 350' with extended seated rest break. PT provides verbal cues for upright gaze to improve posture and balance, and increasing stride length to decrease risk for falls.  Pt transfers to nustep with verbal cues for positioning. Pt performs Nustep for strength and endurance training as well as reciprocal coordination. Pt completes 5:00 x2 at workload of 5 with average steps per minute ~65-70.  WC transport back to room. Left seated in WC with alarm intact and all needs within reach.  Therapy Documentation Precautions:  Precautions Precautions: Fall Precaution Comments: Maintain BP <160; HOB >30 degrees Restrictions Weight Bearing Restrictions: Yes    Therapy/Group: Individual Therapy  Breck Coons, PT, DPT 02/17/2021, 3:40 PM

## 2021-02-17 NOTE — Progress Notes (Signed)
Occupational Therapy Session Note  Patient Details  Name: Kyle Roberson MRN: [id number] Date of Birth: 07-29-1940  Today's Date: 02/17/2021 OT Individual Time: 1115-1207 OT Individual Time Calculation (min): 52 min    Short Term Goals: Week 1:  OT Short Term Goal 1 (Week 1): Patient will complete toilet transfers with Min A and LRAD. OT Short Term Goal 2 (Week 1): Patient will complete 3/3 parts of toileting task with Min A, AE and LRAD. OT Short Term Goal 3 (Week 1): Patient will complete LB dressing with Min A and LRAD.  Skilled Therapeutic Interventions/Progress Updates:    Pt greeted seated in wc and agreeable to OT treatment session. Pt declined need for any BADL tasks today. Pt brought to therapy gym and worked on standing balance/endurance within R hand fine motor activity. Pt needed CGA for sit<>stand and overall close supervision for standing balance on even surface with L UE support. Focus on in-hand manipulation, translation, and rotation of small pegs. Pt with increased difficulty with palmer translation and rotation of pegs. OT issued squeeze foam and educated on hand exercises with foam. UB there-ex using Sci[employer] arm bike on level 5, for 2x 5 minutes. Pt returned to room and OT educated pt's significant other on self-feeding technique using built up handle. Pt left seated in wc with alarm belt on, call bell in reach, and needs met.   Therapy Documentation Precautions:  Precautions Precautions: Fall Precaution Comments: Maintain BP <160; HOB >30 degrees Restrictions Weight Bearing Restrictions: Yes Pain:  No pain   Therapy/Group: Individual Therapy  Valma Cava 02/17/2021, 12:52 PM

## 2021-02-17 NOTE — Progress Notes (Signed)
Speech Language Pathology TBI Note  Patient Details  Name: Kyle Roberson MRN: [id number] Date of Birth: 1941-02-06  Today's Date: 02/17/2021 SLP Individual Time: 1300-1345 SLP Individual Time Calculation (min): 45 min  Short Term Goals: Week 1: SLP Short Term Goal 1 (Week 1): Patient will demonstrate functional problem solving for mildly complex tasks with supervision level verbal cues. SLP Short Term Goal 2 (Week 1): Patient will demonstrate selective attention to functional tasks in a mildly distracting enviornment for 30 minutes with Min verbal cues for redirection. SLP Short Term Goal 3 (Week 1): Patient will recall new, daily information with supervision level verbal cues.  Skilled Therapeutic Interventions: Skilled treatment session focused on cognitive-linguistic goals. SLP facilitated session by providing extra time and overall supervision level verbal cues for functional problem solving during a basic medication management task. Patient demonstrated sustained attention to task for ~15 minutes with Mod I. Patient's significant other present and asking appropriate questions regarding prognosis, etc. Patient without significant word-finding issues with intermittent phonemic paraphasias that patient was able to self-monitor and correct. Patient left upright in wheelchair with alarm on and all needs within reach. Continue with current plan of care.      Pain No/Denies Pain  Agitated Behavior Scale: TBI Observation Details Observation Environment: Patient's room Start of observation period - Date: 02/17/21 Start of observation period - Time: 1300 End of observation period - Date: 02/17/21 End of observation period - Time: 1345 Agitated Behavior Scale (DO NOT LEAVE BLANKS) Short attention span, easy distractibility, inability to concentrate: Present to a slight degree Impulsive, impatient, low tolerance for pain or frustration: Absent Uncooperative, resistant to care, demanding:  Absent Violent and/or threatening violence toward people or property: Absent Explosive and/or unpredictable anger: Absent Rocking, rubbing, moaning, or other self-stimulating behavior: Absent Pulling at tubes, restraints, etc.: Absent Wandering from treatment areas: Absent Restlessness, pacing, excessive movement: Absent Repetitive behaviors, motor, and/or verbal: Absent Rapid, loud, or excessive talking: Absent Sudden changes of mood: Absent Easily initiated or excessive crying and/or laughter: Absent Self-abusiveness, physical and/or verbal: Absent Agitated behavior scale total score: 15  Therapy/Group: Individual Therapy  Vu Liebman, Cameron 02/17/2021, 3:17 PM

## 2021-02-17 NOTE — Telephone Encounter (Signed)
-----   Message from Nelva Bush, MD sent at 02/11/2021 12:34 PM EST ----- Regarding: Follow-up Hello,  Could you arrange for Mr. Fehl to f/u with [name] or an APP in 3-4 weeks?  Thanks.  Gerald Stabs

## 2021-02-17 NOTE — Telephone Encounter (Signed)
Patient Kyle Roberson states patient is currently admitted to rehab with no dc date.  She will call when home to schedule.

## 2021-02-18 LAB — GLUCOSE, CAPILLARY
Glucose-Capillary: 136 mg/dL — ABNORMAL HIGH (ref 70–99)
Glucose-Capillary: 244 mg/dL — ABNORMAL HIGH (ref 70–99)
Glucose-Capillary: 155 mg/dL — ABNORMAL HIGH (ref 70–99)
Glucose-Capillary: 161 mg/dL — ABNORMAL HIGH (ref 70–99)

## 2021-02-18 MED ORDER — ISOSORBIDE MONONITRATE ER 30 MG PO TB24
90.0000 mg | ORAL_TABLET | Freq: Every day | ORAL | Status: DC
  Administered 2021-02-19 – 2021-02-26 (×8): 90 mg via ORAL
  Filled 2021-02-18 (×9): qty 3

## 2021-02-18 NOTE — Progress Notes (Signed)
Physical Therapy Session Note  Patient Details  Name: Kyle Roberson MRN: [id number] Date of Birth: 08-17-1940  Today's Date: 02/18/2021 PT Individual Time: 1002-1029 and 1315-1330 PT Individual Time Calculation (min): 27 min and 15 min  Short Term Goals: Week 1:  PT Short Term Goal 1 (Week 1): Pt will perform bed mobility with minA. PT Short Term Goal 2 (Week 1): Pt will perform bed to chair transfer with CGA. PT Short Term Goal 3 (Week 1): Pt will ambulate x150' with LRAD.  Skilled Therapeutic Interventions/Progress Updates:     1st Session: Pt received seated in [facility] and agrees to therapy. No complaint of pain but reports being "through hell and back", referring to having staples removed from skull. WC transport to gym for time management. PT assists pt with donning bilateral knee high ted hose for edema management and DVT prophylaxis. Pt noted to have pitting edema from hospital socks. Pt attempts to participate in Wii bowling game with intent on coordination training for R upper extremity as well as balance challenge. Pt is unable to manipulate Wii remote effectively or consistently so activity terminated. Pt instead ambulates x175' with CGA and cues to narrow BOS and increase gait speed to decrease risk for falls. WC transport back to room. Pt performs ambulatory transfer to toilet with CGA and performs seated toileting with supervision. Pt left seated in WC with alarm intact and all needs within reach.  2nd Session: Pt misses 15 minutes of skilled PT due to toileting at initiation of session. Pt then received seated in [facility] and agrees to therapy. No complaint of pain. Pt performs sit to stand with cues for hand placement, then ambulates x175' with CGA and cues navigation and utilizing more narrow BOS for improved balance. Pt takes seated rest break and BP taken at 165/66. SLP present and aware. Pt's WC brought to him and pt performs stand step transfer to [facility] with cues for positioning. Left  with SLP.   Therapy Documentation Precautions:  Precautions Precautions: Fall Precaution Comments: Maintain BP <160; HOB >30 degrees Restrictions Weight Bearing Restrictions: Yes   Therapy/Group: Individual Therapy  Breck Coons, PT, DPT 02/18/2021, 1:58 PM

## 2021-02-18 NOTE — Progress Notes (Signed)
Occupational Therapy TBI Note  Patient Details  Name: Kyle Roberson MRN: [id number] Date of Birth: [birth year]  Today's Date: 02/18/2021 Session 1 OT Individual Time: 1595-3967 OT Individual Time Calculation (min): 55 min   Short Term Goals: Week 1:  OT Short Term Goal 1 (Week 1): Patient will complete toilet transfers with Min A and LRAD. OT Short Term Goal 2 (Week 1): Patient will complete 3/3 parts of toileting task with Min A, AE and LRAD. OT Short Term Goal 3 (Week 1): Patient will complete LB dressing with Min A and LRAD.  Skilled Therapeutic Interventions/Progress Updates:  Session 1   Pt greeted semi-reclined in bed after finishing breakfast. Pt had eaten breakfast, but stated he had difficulty cutting up food. Pt also on 2L of O2 which he has had on at night. O2 removed and maintained at 95% on RA throughout OT treatment session. Pt completed bed mobility with increased time and supervision. Stand-pivot transfers bed>wc>toilet with min A. CGA for balance when managing clothing. Pt voided bladder in commode but no BM. Bathing/dressing completed sit<>stand from wc with focus on forced use of R UE for neuro re-ed within BADL tasks. Pt frequently dropping wash cloth with R hand and needed min cues to initiate use of R hand. Overall min A for BADL tasks. Standing balance/endurance with pt tolerating standing for 2 minutes with close supervision while brushing teeth with R hand. Pt's significant other called and OT answered the phone. 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 Very concerned with being worked too hard and that they were told patient would be handled with "kid gloves here." Also concerned about staple removal and wants MD to remove staples. OT will discuss concerns at team conference. Pt left seated in wc with alarm belt on, call bell in reach,  and needs met.   Denies pain  Therapy Documentation Precautions:  Precautions Precautions: Fall Precaution Comments: Maintain BP <160; HOB >30 degrees Restrictions Weight Bearing Restrictions: Yes Pain:  No pain Agitated Behavior Scale: TBI Observation Details Observation Environment: Room Start of observation period - Date: 02/18/21 Start of observation period - Time: 0830 End of observation period - Date: 02/18/21 End of observation period - Time: 0930 Agitated Behavior Scale (DO NOT LEAVE BLANKS) Short attention span, easy distractibility, inability to concentrate: Present to a slight degree Impulsive, impatient, low tolerance for pain or frustration: Absent Uncooperative, resistant to care, demanding: Absent Violent and/or threatening violence toward people or property: Absent Explosive and/or unpredictable anger: Absent Rocking, rubbing, moaning, or other self-stimulating behavior: Absent Pulling at tubes, restraints, etc.: Absent Wandering from treatment areas: Absent Restlessness, pacing, excessive movement: Absent Repetitive behaviors, motor, and/or verbal: Absent Rapid, loud, or excessive talking: Absent Sudden changes of mood: Absent Easily initiated or excessive crying and/or laughter: Absent Self-abusiveness, physical and/or verbal: Absent Agitated behavior scale total score: 15  Therapy/Group: Individual Therapy  Valma Cava 02/18/2021, 9:35 AM

## 2021-02-18 NOTE — Progress Notes (Signed)
Office of Dr. Deetta Perla called requesting to place an order to remove staples on head. Dan PA notified. New orders noted

## 2021-02-18 NOTE — Progress Notes (Signed)
PROGRESS NOTE   Subjective/Complaints: Said he wasn't doing well this morning. Upset that nobody helped him with breakfast and that his meal was cold. Also felt that therapy worked him too hard yesterday. SO voiced concerns re: carotid artery stenosis and intensity of therapy/whether we were monitoring bp's.   ROS: Patient denies fever, rash, sore throat, blurred vision, nausea, vomiting, diarrhea, cough, shortness of breath or chest pain, joint or back pain, headache, or mood change.    Objective:   No results found. Recent Labs    02/16/21 0605  WBC 7.2  HGB 12.0*  HCT 36.8*  PLT 231   Recent Labs    02/16/21 0605  NA 138  K 3.9  CL 100  CO2 29  GLUCOSE 126*  BUN 41*  CREATININE 2.00*  CALCIUM 8.7*    Intake/Output Summary (Last 24 hours) at 02/18/2021 1119 Last data filed at 02/18/2021 0247 Gross per 24 hour  Intake 177 ml  Output 200 ml  Net -23 ml        Physical Exam: Vital Signs Blood pressure (!) 159/55, pulse (!) 48, temperature 97.8 F (36.6 C), resp. rate 18, height 5\' 8"  (1.727 m), weight 103.4 kg, SpO2 100 %.  Constitutional: No distress . Vital signs reviewed. HEENT: NCAT, EOMI, oral membranes moist Neck: supple Cardiovascular: RRR without murmur. No JVD    Respiratory/Chest: CTA Bilaterally without wheezes or rales. Normal effort    GI/Abdomen: BS +, non-tender, non-distended Ext: no clubbing, cyanosis, or edema Psych: anxious and sl irritable initially Skin: No evidence of breakdown, no evidence of rash. Chronic venous changes BLE Scalp staples CDI left scalp Musculoskeletal: Full range of motion in all 4 extremities. No joint swelling Neuro:  Pt alert. Follows basic commands. Decreased visual acuity. Displays fair insight and memory. RUE4/5 prox to distal. LUE 5-/5. RLE 4/5 prox to distal, LLE 4+ to 5-/5. No focal sensory findings. DTR's 1+. No abnl tone    Assessment/Plan: 1.  Functional deficits which require 3+ hours per day of interdisciplinary therapy in a comprehensive inpatient rehab setting. Physiatrist is providing close team supervision and 24 hour management of active medical problems listed below. Physiatrist and rehab team continue to assess barriers to discharge/monitor patient progress toward functional and medical goals  Care Tool:  Bathing    Body parts bathed by patient: Right arm, Left arm, Chest, Abdomen (UB only this am)   Body parts bathed by helper: Left arm, Buttocks, Right lower leg, Left lower leg     Bathing assist Assist Level: Supervision/Verbal cueing     Upper Body Dressing/Undressing Upper body dressing   What is the patient wearing?: Pull over shirt    Upper body assist Assist Level: Minimal Assistance - Patient > 75%    Lower Body Dressing/Undressing Lower body dressing      What is the patient wearing?: Underwear/pull up, Pants     Lower body assist Assist for lower body dressing: Moderate Assistance - Patient 50 - 74%     Toileting Toileting    Toileting assist Assist for toileting: Contact Guard/Touching assist     Transfers Chair/bed transfer  Transfers assist     Chair/bed transfer  assist level: Minimal Assistance - Patient > 75%     Locomotion Ambulation   Ambulation assist      Assist level: Contact Guard/Touching assist Assistive device: No Device Max distance: 150   Walk 10 feet activity   Assist     Assist level: Contact Guard/Touching assist Assistive device: No Device   Walk 50 feet activity   Assist    Assist level: Contact Guard/Touching assist Assistive device: No Device    Walk 150 feet activity   Assist    Assist level: Contact Guard/Touching assist Assistive device: No Device    Walk 10 feet on uneven surface  activity   Assist     Assist level: Minimal Assistance - Patient > 75%     Wheelchair     Assist Is the patient using a  wheelchair?: No             Wheelchair 50 feet with 2 turns activity    Assist            Wheelchair 150 feet activity     Assist          Blood pressure (!) 159/55, pulse (!) 48, temperature 97.8 F (36.6 C), resp. rate 18, height 5\' 8"  (1.727 m), weight 103.4 kg, SpO2 100 %.  Medical Problem List and Plan: 1.  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  Status post craniotomy evacuation 02/05/2021             -patient may not shower unless covers hair/head             -ELOS/Goals: 12-14 days- supervision  -Continue CIR therapies including PT, OT, and SLP. Interdisciplinary team conference today to discuss goals, barriers to discharge, and dc planning.   2.  Antithrombotics: -DVT/anticoagulation:  Pharmaceutical: Other (comment) Eliquis             -antiplatelet therapy: N/A 3. Pain: continue Fioricet, oxycodone 4. Mood: Provide emotional support  -monitor interactions with staff             -antipsychotic agents: N/A 5. Neuropsych: This patient is capable of making decisions on his own behalf. 6. Skin/Wound Care: remove staples today 7. Fluids/Electrolytes/Nutrition: Routine in and out with follow-up chemistry  -low albumin---daily protein supp  -good PO 8.  Diabetes mellitus peripheral neuropathy.  Hemoglobin A1c 7.3.  NovoLog 3 units 4 times daily with meals, Semglee 10 units daily.     CBG (last 3)  Recent Labs    02/17/21 1651 02/17/21 2106 02/18/21 0607  GLUCAP 185* 167* 136*  I  improving. No further changes at present  9.  CKD stage IV.  Continue torsemide for now, Zaroxolyn on hold.  -Cr in range of 1.9-2.1- stable  10.  Obesity.  BMI 34.97.  Dietary follow-up 11.  Chronic atrial fibrillation.  Cardiac rate controlled.  Eliquis resumed 02/11/2021 12.  Hypertension.  Imdur 30 mg daily, Demadex 60 mg daily   Vitals:   02/17/21 1943 02/18/21 0510  BP: (!) 152/66 (!) 159/55  Pulse: (!) 56 (!) 48   Resp: 16 18  Temp: 97.7 F (36.5 C) 97.8 F (36.6 C)  SpO2: 95% 100%  Also on Hydralazine 100mg  TID,   increased Imdur to 60mg  qd.   -may need further titration of imdur. Adjust to 90mg  starting 11/30 13.  Chronic diastolic congestive heart failure.  Continue Demadex 60 mg daily.  Monitor for any signs of fluid overload  - oxygen at  HS scheduled Filed Weights   02/11/21 1549  Weight: 103.4 kg    -need weights qod--ordered 14.  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  Continue eyedrops. 15.  CAD with history of stenting.  Continue Imdur.  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 Constipation: added colace 100mg  daily.  17. Bradycardia: continue to monitor, will not decrease medications given elevated SBP 18. OSA: schedule oxygen, outpt sleep study    LOS: 7 days A FACE TO FACE EVALUATION WAS PERFORMED  Meredith Staggers 02/18/2021, 11:19 AM

## 2021-02-18 NOTE — Progress Notes (Signed)
Speech Language Pathology TBI Note  Patient Details  Name: Lawrnce Reyez MRN: [id number] Date of Birth: 05/10/1940  Today's Date: 02/18/2021 SLP Individual Time: 1330-1415 SLP Individual Time Calculation (min): 45 min  Short Term Goals: Week 1: SLP Short Term Goal 1 (Week 1): Patient will demonstrate functional problem solving for mildly complex tasks with supervision level verbal cues. SLP Short Term Goal 2 (Week 1): Patient will demonstrate selective attention to functional tasks in a mildly distracting enviornment for 30 minutes with Min verbal cues for redirection. SLP Short Term Goal 3 (Week 1): Patient will recall new, daily information with supervision level verbal cues.  Skilled Therapeutic Interventions: Skilled treatment session focused on cognitive-linguistic goals. SLP facilitated session by providing education regarding strategies to help monitor and correct phonemic paraphasias. Patient without evidence of word-finding deficits today but did demonstrate motor planning deficits. Patient participated in a structured speech task that focused on 2 word utterances as well as sentences. Patient with mild-moderate phonemic errors that patient required Mod verbal cues to self-monitor and correct. Throughout task, moderate verbal cues were needed for sustained attention to task as patient would often need redirection due to verbosity. Patient left upright in wheelchair and all needs within reach. Continue with current plan of care.      Pain No/Denies Pain   Agitated Behavior Scale: TBI Observation Details Observation Environment: Patient's room Start of observation period - Date: 02/18/21 Start of observation period - Time: 1330 End of observation period - Date: 02/18/21 End of observation period - Time: 1415 Agitated Behavior Scale (DO NOT LEAVE BLANKS) Short attention span, easy distractibility, inability to concentrate: Present to a slight degree Impulsive, impatient, low  tolerance for pain or frustration: Present to a slight degree Uncooperative, resistant to care, demanding: Absent Violent and/or threatening violence toward people or property: Absent Explosive and/or unpredictable anger: Absent Rocking, rubbing, moaning, or other self-stimulating behavior: Absent Pulling at tubes, restraints, etc.: Absent Wandering from treatment areas: Absent Restlessness, pacing, excessive movement: Absent Repetitive behaviors, motor, and/or verbal: Absent Rapid, loud, or excessive talking: Absent Sudden changes of mood: Absent Easily initiated or excessive crying and/or laughter: Absent Self-abusiveness, physical and/or verbal: Absent Agitated behavior scale total score: 16  Therapy/Group: Individual Therapy  [name], [place] 02/18/2021, 3:09 PM

## 2021-02-18 NOTE — Progress Notes (Signed)
Patient ID: Kyle Roberson, male   DOB: 06/07/1940, 80 y.o.   MRN: [id number]  SW made efforts to meet with pt to provide updates from team conference, but pt not in room.   *SW called pt partner Jeannene Patella to provide updates from team conference, and d/c date 02/27/21. Fam edu scheduled for Monday (12/5) 9am-2pm.  Loralee Pacas, MSW, [place] Office: [phone] Cell: [phone] Fax: [phone]

## 2021-02-18 NOTE — Patient Care Conference (Signed)
Inpatient RehabilitationTeam Conference and Plan of Care Update Date: 02/18/2021   Time: 10:42 AM    Patient Name: Kyle Roberson      Medical Record Number: [id number]  Date of Birth: 10/28/1940 Sex: Male         Room/Bed: 4W21C/4W21C-01 Payor Info: Payor: [employer] MEDICARE / Plan: [employer] MEDICARE HMO/PPO / Product Type: *No Product type* /    Admit Date/Time:  02/11/2021  2:01 PM  Primary Diagnosis:  Traumatic subdural hematoma  Hospital Problems: Principal Problem:   Traumatic subdural hematoma    Expected Discharge Date: Expected Discharge Date: 02/27/21  Team Members Present: Physician leading conference: Dr. Alger Simons Social Worker Present: Loralee Pacas, [place] Nurse Present: Dorthula Nettles, RN PT Present: Tereasa Coop, PT OT Present: Cherylynn Ridges, OT SLP Present: Weston Anna, SLP PPS Coordinator present : Gunnar Fusi, SLP     Current Status/Progress Goal Weekly Team Focus  Bowel/Bladder   Continent with some incontinent episodes. LBM 11/28  Regain full continence.  Toilet q2-3hr   Swallow/Nutrition/ Hydration             ADL's   Min A overall  Supervision  R fine motor coordination, R NMR, transfers, safety awareness, dc planning, pt/family education   Mobility   bed mobility modA, sit to stand close CGA, gait x405' without AD CGA/minA  Supervision  balance, bed mobility, multitasking, ambulation   Communication   Supervision  Mod I  self-monitoring and correcting phonemic/verbal errors   Safety/Cognition/ Behavioral Observations  [place] Level VII-Min A  Mod I  complex problem solving, recall, attention   Pain   No c/o pain  pain <3-10  Assess Qshift and prn   Skin   Skin intact aside from left side crani site.  Maintain skin integrity.  Assess Qshift and prn     Discharge Planning:  Pt to d/c to home with his partner who will provide support pt requires. Will hire assistance if needed.   Team Discussion: Right side weakness. Requiring  oxygen at night. Patient's significant other voiced concerns about Carotid artery in neck and doesn't want LPN's caring for patient. Discontinuing staples today. Reports headache at 5/10, PRN's available. Balance is not great. Can self-feed and only needs set-up.  Patient on target to meet rehab goals: yes, contact guard/min assist currently. Has baseline memory issues and can have word finding issues. Mod I to supervision goals.  *See Care Plan and progress notes for long and short-term goals.   Revisions to Treatment Plan:  Adjusting medications.  Teaching Needs: Family education, medication/pain management, skin/wound care, safety awareness, transfer training, etc.  Current Barriers to Discharge: Decreased caregiver support, Wound care, Lack of/limited family support, Medication compliance, and Behavior  Possible Resolutions to Barriers: Family education Set up HH/Outpatient therapy Order DME     Medical Summary Current Status: chronic SDH with accumlation s/p crani. eliquis resumed. bp controlled  Barriers to Discharge: Medical stability   Possible Resolutions to [employer] Focus: daily assessment of labs and pt data   Continued Need for Acute Rehabilitation Level of Care: The patient requires daily medical management by a physician with specialized training in physical medicine and rehabilitation for the following reasons: Direction of a multidisciplinary physical rehabilitation program to maximize functional independence : Yes Medical management of patient stability for increased activity during participation in an intensive rehabilitation regime.: Yes Analysis of laboratory values and/or radiology reports with any subsequent need for medication adjustment and/or medical intervention. : Yes   I attest that I  was present, lead the team conference, and concur with the assessment and plan of the team.   Cristi Loron 02/18/2021, 2:32 PM

## 2021-02-18 NOTE — Progress Notes (Signed)
Occupational Therapy Weekly Progress Note  Patient Details  Name: Kyle Roberson MRN: [id number] Date of Birth: October 28, 1940  Beginning of progress report period: February 12, 2021 End of progress report period: February 18, 2021  Today's Date: 02/18/2021 OT Individual Time: 1416-1500 OT Individual Time Calculation (min): 44 min   Patient has met 3 of 3 short term goals.  Patient is making steady progress towards OT goals. Pt is currently at [place] A level for all  BADL tasks with improved strength and coordination of R side. Continue current POC.   Patient continues to demonstrate the following deficits: muscle weakness, decreased cardiorespiratoy endurance, unbalanced muscle activation, ataxia, and decreased coordination, decreased attention to right, decreased initiation, decreased attention, decreased awareness, decreased problem solving, decreased safety awareness, and decreased memory, and decreased sitting balance, decreased standing balance, hemiplegia, and decreased balance strategies and therefore will continue to benefit from skilled OT intervention to enhance overall performance with BADL.  Patient progressing toward long term goals..  Continue plan of care.  OT Short Term Goals Week 1:  OT Short Term Goal 1 (Week 1): Patient will complete toilet transfers with Min A and LRAD. OT Short Term Goal 1 - Progress (Week 1): Met OT Short Term Goal 2 (Week 1): Patient will complete 3/3 parts of toileting task with Min A, AE and LRAD. OT Short Term Goal 2 - Progress (Week 1): Met OT Short Term Goal 3 (Week 1): Patient will complete LB dressing with Min A and LRAD. OT Short Term Goal 3 - Progress (Week 1): Met Week 2:  OT Short Term Goal 1 (Week 2): LTG=STG 2/2 ELOS  Skilled Therapeutic Interventions/Progress Updates:    Pt greeted seated in wc and agreeable to OT treatment session. Pt frustrated that nurse tech opened containers and watched him eat lunch. OT reminded pt of discussion  from this morning with patient and his significant other Kyle Roberson regarding frustrations of patient NOT having assistance for meals. Pt stated he needed help opening and cutting his food this morning and no one was there to help him. Pt's spouse also with complaints of pt not having assistance which is why patient is on supervision for meals. Pt continued to be frustrated and OT was unable to reason with pt. Pt brought to dayroom to change scenery and redirect patient. OT issued soft red theraputty and needed moderate cues to integrate R hand In therapeutic activity. Focus on grip and pinch as well as coordination to pick up small pieces of putty using pincer grasp. Pt reported need to go to the bathroom and returned to room. Functional ambulation into bathroom w/ min A and no AD. Pt voided bladder and completed peri-care with supervision. Pt ambulated to the sink, washed hands and returned to bed with min A. Pt left semi-reclined in bed with bed alarm on, call bell in reach, and needs met.   Therapy Documentation Precautions:  Precautions Precautions: Fall Precaution Comments: Maintain BP <160; HOB >30 degrees Restrictions Weight Bearing Restrictions: Yes Pain: Pain Assessment Pain Scale: 0-10 Pain Score: 0-No pain  Therapy/Group: Individual Therapy  Valma Cava 02/18/2021, 11:49 AM

## 2021-02-18 NOTE — Progress Notes (Signed)
Removed all staples from patients head. Patient tolerated well

## 2021-02-19 LAB — GLUCOSE, CAPILLARY
Glucose-Capillary: 139 mg/dL — ABNORMAL HIGH (ref 70–99)
Glucose-Capillary: 158 mg/dL — ABNORMAL HIGH (ref 70–99)
Glucose-Capillary: 133 mg/dL — ABNORMAL HIGH (ref 70–99)
Glucose-Capillary: 192 mg/dL — ABNORMAL HIGH (ref 70–99)

## 2021-02-19 NOTE — Progress Notes (Signed)
Speech Language Pathology Weekly Progress and Session Note  Patient Details  Name: Kyle Roberson MRN: [id number] Date of Birth: 12-05-1940  Beginning of progress report period: February 12, 2021 End of progress report period: February 19, 2021  Today's Date: 02/19/2021 SLP Individual Time: 1000-1045 SLP Individual Time Calculation (min): 45 min  Short Term Goals: Week 1: SLP Short Term Goal 1 (Week 1): Patient will demonstrate functional problem solving for mildly complex tasks with supervision level verbal cues. SLP Short Term Goal 1 - Progress (Week 1): Not met SLP Short Term Goal 2 (Week 1): Patient will demonstrate selective attention to functional tasks in a mildly distracting enviornment for 30 minutes with Min verbal cues for redirection. SLP Short Term Goal 2 - Progress (Week 1): Met SLP Short Term Goal 3 (Week 1): Patient will recall new, daily information with supervision level verbal cues. SLP Short Term Goal 3 - Progress (Week 1): Not met    New Short Term Goals: Week 2: SLP Short Term Goal 1 (Week 2): Patient will demonstrate functional problem solving for mildly complex tasks with supervision level verbal cues. SLP Short Term Goal 2 (Week 2): Patient will demonstrate selective attention to functional tasks in a mildly distracting enviornment for 45 minutes with Supervision verbal cues for redirection. SLP Short Term Goal 3 (Week 2): Patient will recall new, daily information with supervision level verbal cues. SLP Short Term Goal 4 (Week 2): Patient will utilize speech strategies to minimize phonemic errors during structured tasks with Min verbal cues. SLP Short Term Goal 5 (Week 2): Patient will self-monitor and correct phonemic errors with Min verbal cues.  Weekly Progress Updates: Patient has made functional gains and has met 1 of 3 STGs this reporting period. Currently, patient demonstrates behaviors consistent with a [place] Level VII and requires overall Min A  verbal cues to complete functional and mildly complex tasks safely in regards to problem solving, attention and recall. Patient can verbally express his basic wants/needs but demonstrates moderate phonemic errors at times, especially when fatigued and frustrated. Mod verbal cues are needed for use of speech strategies and to self-monitor and correct phonemic errors. Patient and family education ongoing. Patient would benefit from continued skilled SLP intervention to maximize patient's cognitive and speech functioning prior to discharge.      Intensity: Minumum of 1-2 x/day, 30 to 90 minutes Frequency: 3 to 5 out of 7 days Duration/Length of Stay: 02/27/21 Treatment/Interventions: Cognitive remediation/compensation;Internal/external aids;Therapeutic Activities;Environmental controls;Cueing hierarchy;Functional tasks;Patient/family education;Speech/Language facilitation   Daily Session  Skilled Therapeutic Interventions:  Skilled treatment session focused on cognitive and speech goals. Upon arrival, patient was upright in the bed. SLP offered to get patient into the wheelchair but patient declined and reported he would rather use his time to focus on his communication. Patient initially verbalizing multiple issues with current hospital/hospitalization, SLP provided emotional support. Patient participated in a structured language task that focused on word-finding and ability to self-monitor and correct phonemic paraphasias. Patient was overall mod I for word-finding but required Min verbal cues for a slow rate and use of over-articulation as well as ability to self-monitor speech errors. Patient with required Min verbal cues for sustained attention to task as he often would become verbose and start telling a personal story. [name] with alarm on and all needs within reach. Continue with current plan of care.     Pain No/Denies Pain   Therapy/Group: Individual Therapy  Kyle Roberson,  Kyle Roberson 02/19/2021, 6:25 AM

## 2021-02-19 NOTE — Progress Notes (Signed)
Physical Therapy Weekly Progress Note  Patient Details  Name: Kyle Roberson MRN: [id number] Date of Birth: 06/25/1940  Beginning of progress report period: February 12, 2021 End of progress report period: February 19, 2021  Today's Date: 02/19/2021 PT Individual Time: 1050-1159 PT Individual Time Calculation (min): 55 min   Patient has met 3 of 3 short term goals.  Pt is making good progress toward mobility goals, improving independence with bed mobility, transfers, balance, and ambulation. 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 Pt also somewhat limited by ongoing issues with HTN, requiring frequent rest breaks. Pt will benefit from family education prior to DC.  Patient continues to demonstrate the following deficits muscle weakness, decreased cardiorespiratoy endurance, decreased coordination, decreased problem solving, decreased safety awareness, and decreased memory, and decreased standing balance and decreased balance strategies and therefore will continue to benefit from skilled PT intervention to increase functional independence with mobility.  Patient progressing toward long term goals..  Continue plan of care.  PT Short Term Goals Week 1:  PT Short Term Goal 1 (Week 1): Pt will perform bed mobility with minA. PT Short Term Goal 1 - Progress (Week 1): Met PT Short Term Goal 2 (Week 1): Pt will perform bed to chair transfer with CGA. PT Short Term Goal 2 - Progress (Week 1): Met PT Short Term Goal 3 (Week 1): Pt will ambulate x150' with LRAD. PT Short Term Goal 3 - Progress (Week 1): Met Week 2:  PT Short Term Goal 1 (Week 2): STGs = LTGs  Skilled Therapeutic Interventions/Progress Updates:  Ambulation/gait training;Community reintegration;DME/adaptive equipment instruction;Neuromuscular re-education;Psychosocial support;Stair training;UE/LE Strength taining/ROM;Balance/vestibular training;Functional  electrical stimulation;Discharge planning;Pain management;Skin care/wound management;Therapeutic Activities;UE/LE Coordination activities;Cognitive remediation/compensation;Disease management/prevention;Patient/family education;Functional mobility training;Splinting/orthotics;Therapeutic Exercise   1st Session: Pt received supine in bed and agrees to therapy. No complaint of pain. Supine to sit slowly with bed features and verbal cues on safe positioning. Sit to stand and ambulatory transfer to toilet with CGA and cues for posture to improve balance. Following, pt stands at sink for 5 minutes to wash hands and brush teeth, with verbal and tactile cues to increase trunk extension to improve posture and balance. WC transport to gym for time management. Prior to additional mobility PT assesses pt's blood pressure in sitting, 151/66. Pt performs NMR for standing balance with toe tapping activity with 6 inch step. Pt initially requires modA due to not clearing step with R foot. After seated rest break, pt completes x5 with each foot with minA HHA and cues for body mechanics and sequencing. Following extended seated rest break, pt completes x10 with each foot, performing majority with CGA but requiring minA toward end secondary to fatigue. Pt has BP taken in sitting with reading of 149/61. Following pt performs additional x10 toe taps with minA and increasing difficulty with balance, likely related to fatigue. Pt voices frustration with exercise, feeling that it does not address his balance deficits. PT educates on importance of performing difficult tasks that address balance, but pt continues to appear upset. WC transport back to room. Pt performs ambulatory transfer to toilet with CGA. Continent of urine though felt like he needed to have a bowel movement. Pt ambulates to sink to wash hands and back to chair with CGA and cues for positioning and sequencing for safety. Pt left seated in WC with alarm intact and all  needs within reach.  2nd Session: Pt refuses PT. Does not wish to work with   this PT anymore due to issues pt is unwilling to discuss. Staff aware and all efforts will be made to assign pt to other therapists moving forward. Pt misses 30 minutes of skilled PT.   Therapy Documentation Precautions:  Precautions Precautions: Fall Precaution Comments: Maintain BP <160; HOB >30 degrees Restrictions Weight Bearing Restrictions: Yes  Therapy/Group: Individual Therapy  Breck Coons, PT, DPT 02/19/2021, 3:34 PM

## 2021-02-19 NOTE — Progress Notes (Signed)
PROGRESS NOTE   Subjective/Complaints: In good spirits today. Says he slept. Therapy went without any problems yesterday  ROS: Patient denies fever, rash, sore throat, blurred vision, nausea, vomiting, diarrhea, cough, shortness of breath or chest pain, joint or back pain,  or mood change.   Objective:   No results found. No results for input(s): WBC, HGB, HCT, PLT in the last 72 hours.  No results for input(s): NA, K, CL, CO2, GLUCOSE, BUN, CREATININE, CALCIUM in the last 72 hours.   Intake/Output Summary (Last 24 hours) at 02/19/2021 0831 Last data filed at 02/19/2021 9407 Gross per 24 hour  Intake --  Output 200 ml  Net -200 ml        Physical Exam: Vital Signs Blood pressure (!) 152/57, pulse (!) 49, temperature 98.8 F (37.1 C), temperature source Oral, resp. rate 18, height 5\' 8"  (1.727 m), weight 103.4 kg, SpO2 96 %.  Constitutional: No distress . Vital signs reviewed. HEENT: NCAT, EOMI, oral membranes moist Neck: supple Cardiovascular: RRR without murmur. No JVD    Respiratory/Chest: CTA Bilaterally without wheezes or rales. Normal effort    GI/Abdomen: BS +, non-tender, non-distended Ext: no clubbing, cyanosis, or edema Psych: pleasant and cooperative  Skin: No evidence of breakdown, no evidence of rash. Chronic venous changes BLE Scalp incision cdi Musculoskeletal: Full range of motion in all 4 extremities. No joint swelling Neuro:  Pt alert. Follows basic commands. Decreased visual acuity. Displays fair insight and memory. Occasional word finding deficits. RUE4/5 prox to distal. LUE 5-/5. RLE 4/5 prox to distal, LLE 4+ to 5-/5. No focal sensory findings. DTR's 1+. No abnl tone    Assessment/Plan: 1. Functional deficits which require 3+ hours per day of interdisciplinary therapy in a comprehensive inpatient rehab setting. Physiatrist is providing close team supervision and 24 hour management of active  medical problems listed below. Physiatrist and rehab team continue to assess barriers to discharge/monitor patient progress toward functional and medical goals  Care Tool:  Bathing    Body parts bathed by patient: Right arm, Left arm, Chest, Abdomen (UB only this am)   Body parts bathed by helper: Left arm, Buttocks, Right lower leg, Left lower leg     Bathing assist Assist Level: Supervision/Verbal cueing     Upper Body Dressing/Undressing Upper body dressing   What is the patient wearing?: Pull over shirt    Upper body assist Assist Level: Minimal Assistance - Patient > 75%    Lower Body Dressing/Undressing Lower body dressing      What is the patient wearing?: Underwear/pull up, Pants     Lower body assist Assist for lower body dressing: Moderate Assistance - Patient 50 - 74%     Toileting Toileting    Toileting assist Assist for toileting: Contact Guard/Touching assist     Transfers Chair/bed transfer  Transfers assist     Chair/bed transfer assist level: Minimal Assistance - Patient > 75%     Locomotion Ambulation   Ambulation assist      Assist level: Contact Guard/Touching assist Assistive device: No Device Max distance: 150   Walk 10 feet activity   Assist     Assist level: Contact Guard/Touching  assist Assistive device: No Device   Walk 50 feet activity   Assist    Assist level: Contact Guard/Touching assist Assistive device: No Device    Walk 150 feet activity   Assist    Assist level: Contact Guard/Touching assist Assistive device: No Device    Walk 10 feet on uneven surface  activity   Assist     Assist level: Minimal Assistance - Patient > 75%     Wheelchair     Assist Is the patient using a wheelchair?: No             Wheelchair 50 feet with 2 turns activity    Assist            Wheelchair 150 feet activity     Assist          Blood pressure (!) 152/57, pulse (!) 49,  temperature 98.8 F (37.1 C), temperature source Oral, resp. rate 18, height 5\' 8"  (1.727 m), weight 103.4 kg, SpO2 96 %.  Medical Problem List and Plan: 1.  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  Status post craniotomy evacuation 02/05/2021             -patient may not shower unless covers hair/head             -ELOS/Goals: 12-14 days- supervision  -Continue CIR therapies including PT, OT, and SLP   2.  Antithrombotics: -DVT/anticoagulation:  Pharmaceutical: Other (comment) Eliquis             -antiplatelet therapy: N/A 3. Pain: continue Fioricet, oxycodone 4. Mood: Provide emotional support  -monitor interactions with staff             -antipsychotic agents: N/A 5. Neuropsych: This patient is capable of making decisions on his own behalf. 6. Skin/Wound Care: remove staples today 7. Fluids/Electrolytes/Nutrition: Routine in and out with follow-up chemistry  -low albumin---daily protein supp  -good PO 8.  Diabetes mellitus peripheral neuropathy.  Hemoglobin A1c 7.3.  NovoLog 3 units 4 times daily with meals, Semglee 10 units daily.     CBG (last 3)  Recent Labs    02/18/21 1656 02/18/21 2130 02/19/21 0605  GLUCAP 155* 161* 139*  11/30  improving. No further changes at present  9.  CKD stage IV.  Continue torsemide for now, Zaroxolyn on hold.  -Cr in range of 1.9-2.1- stable  10.  Obesity.  BMI 34.97.  Dietary follow-up 11.  Chronic atrial fibrillation.  Cardiac rate controlled.  Eliquis resumed 02/11/2021 12.  Hypertension.  Imdur 30 mg daily, Demadex 60 mg daily   Vitals:   02/18/21 1955 02/19/21 0327  BP: (!) 152/50 (!) 152/57  Pulse: (!) 55 (!) 49  Resp: 18 18  Temp: 98.6 F (37 C) 98.8 F (37.1 C)  SpO2: 98% 96%  Also on Hydralazine 100mg  TID,   increased Imdur to 60mg  qd.   -  Adjusted imdur to 90mg  starting 11/30 13.  Chronic diastolic congestive heart failure.  Continue Demadex 60 mg daily.  Monitor for any signs  of fluid overload  - oxygen at HS scheduled Filed Weights   02/11/21 1549  Weight: 103.4 kg    -need weights qod--ordered 14.  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  Continue eyedrops. 15.  CAD with history of stenting.  Continue Imdur.  No chest pain or shortness of breath  ?right carotid artery dissection from trauma?-  + flow on MRA earlier this month.  No other carotid studies in system  -track bp's with therapy  (keep less than 160/90) 16. Constipation: added colace 100mg  daily.  17. Bradycardia: continue to monitor, will not decrease medications given elevated SBP 18. OSA: schedule oxygen, outpt sleep study    LOS: 8 days A FACE TO FACE EVALUATION WAS PERFORMED  Meredith Staggers 02/19/2021, 8:31 AM

## 2021-02-19 NOTE — Progress Notes (Signed)
Occupational Therapy TBI Note  Patient Details  Name: Kyle Roberson MRN: [id number] Date of Birth: Aug 14, 1940  Today's Date: 02/19/2021 OT Individual Time: 1406-1500 OT Individual Time Calculation (min): 54 min    Short Term Goals: Week 2:  OT Short Term Goal 1 (Week 2): LTG=STG 2/2 ELOS  Skilled Therapeutic Interventions/Progress Updates:    Pt received sitting up in the w/c with no c/o pain, agreeable to session, declining need for ADLs. Pt was taken to the therapy gym. CGA for stand pivot transfer to the mat. From EOM he completed various [employer] tasks to increase R UE NMR. He completed several tasks focused on pincer grasp strengthening, lateral pinch, in hand manipulation, and palm- to- finger translation. Pt required min cueing throughout for positioning. Pt then completed BUE strengthening circuit with a 4lb dumbbell focusing on functional reaching- min facilitation for RUE grasp on dumbbell and for controlling descent. Pt then completed standing level dynamic reaching with a dumbbell, reaching across midline and then overhead to challenge core stabilization. Lastly, pt completed reciprocal stepping onto a 6 in step with min A initially and then fading to CGA. Pt returned to his room via w/c and was left sitting up with all needs met- chair alarm set.   Therapy Documentation Precautions:  Precautions Precautions: Fall Precaution Comments: Maintain BP <160; HOB >30 degrees Restrictions Weight Bearing Restrictions: Yes  Agitated Behavior Scale: TBI Observation Details Observation Environment: CIR Start of observation period - Date: 02/19/21 Start of observation period - Time: 1400 End of observation period - Date: 02/19/21 End of observation period - Time: 1500 Agitated Behavior Scale (DO NOT LEAVE BLANKS) Short attention span, easy distractibility, inability to concentrate: Present to a slight degree Impulsive, impatient, low tolerance for pain or frustration:  Absent Uncooperative, resistant to care, demanding: Absent Violent and/or threatening violence toward people or property: Absent Explosive and/or unpredictable anger: Absent Rocking, rubbing, moaning, or other self-stimulating behavior: Absent Pulling at tubes, restraints, etc.: Absent Wandering from treatment areas: Absent Restlessness, pacing, excessive movement: Absent Repetitive behaviors, motor, and/or verbal: Absent Rapid, loud, or excessive talking: Absent Sudden changes of mood: Absent Easily initiated or excessive crying and/or laughter: Absent Self-abusiveness, physical and/or verbal: Absent Agitated behavior scale total score: 15     Therapy/Group: Individual Therapy  Curtis Sites 02/19/2021, 3:09 PM

## 2021-02-19 NOTE — Plan of Care (Signed)
  Problem: RH Expression Communication Goal: LTG Patient will verbally express basic/complex needs(SLP) Description: LTG:  Patient will verbally express basic/complex needs, wants or ideas with cues  (SLP) Flowsheets (Taken 02/19/2021 0625) LTG: Patient will verbally express basic/complex needs, wants or ideas (SLP): Supervision Note: Goal added

## 2021-02-20 ENCOUNTER — Ambulatory Visit: Admission: RE | Admit: 2021-02-20 | Payer: Medicare HMO | Source: Ambulatory Visit

## 2021-02-20 LAB — GLUCOSE, CAPILLARY
Glucose-Capillary: 151 mg/dL — ABNORMAL HIGH (ref 70–99)
Glucose-Capillary: 166 mg/dL — ABNORMAL HIGH (ref 70–99)
Glucose-Capillary: 229 mg/dL — ABNORMAL HIGH (ref 70–99)
Glucose-Capillary: 231 mg/dL — ABNORMAL HIGH (ref 70–99)

## 2021-02-20 MED ORDER — ROSUVASTATIN CALCIUM 5 MG PO TABS
5.0000 mg | ORAL_TABLET | ORAL | Status: DC
  Administered 2021-02-20 – 2021-02-27 (×4): 5 mg via ORAL
  Filled 2021-02-20 (×4): qty 1

## 2021-02-20 NOTE — Progress Notes (Signed)
Occupational Therapy Session Note  Patient Details  Name: Kyle Roberson MRN: [id number] Date of Birth: 05-22-1940  Today's Date: 02/20/2021 OT Individual Time: 1300-1345 OT Individual Time Calculation (min): 45 min    Short Term Goals: Week 2:  OT Short Term Goal 1 (Week 2): LTG=STG 2/2 ELOS  Skilled Therapeutic Interventions/Progress Updates:    Pt greeted seated in wc and agreeable to OT treatment session. Pt had a few more bites of lunch to finish. Pt able to manipulate fork, accurately pierce fruit, and bring fruit to mouth. Improved grasp with R hand and coordination to get food to mouth. Pt brought to therapy gym and worked on L hand grip and pinch strength with soft red theraputty exercises. OT placed beads in putty to focus on finger isolation and pincer grasp to collect small beads. Pt ambulated back to room without AD and CGA. Pt left semi-reclined in bed with bed alarm on, call bell in reach, and needs met.   Therapy Documentation Precautions:  Precautions Precautions: Fall Precaution Comments: Maintain BP <160; HOB >30 degrees Restrictions Weight Bearing Restrictions: Yes Pain: Denies pain   Therapy/Group: Individual Therapy  Valma Cava 02/20/2021, 1:57 PM

## 2021-02-20 NOTE — Progress Notes (Signed)
Ok to resume Crestor 5mg  3x/week per Dr. Naaman Plummer.  Onnie Boer, PharmD, BCIDP, AAHIVP, CPP Infectious Disease Pharmacist 02/20/2021 9:32 AM

## 2021-02-20 NOTE — Progress Notes (Signed)
PROGRESS NOTE   Subjective/Complaints: Pt slept well. Had a good night. Therapy went without issues. Does report a run in with someone who "was talking down" to him. He said he had let it go.   ROS: Patient denies fever, rash, sore throat, blurred vision, nausea, vomiting, diarrhea, cough, shortness of breath or chest pain, joint or back pain, headache, or mood change.   Objective:   No results found. No results for input(s): WBC, HGB, HCT, PLT in the last 72 hours.  No results for input(s): NA, K, CL, CO2, GLUCOSE, BUN, CREATININE, CALCIUM in the last 72 hours.   Intake/Output Summary (Last 24 hours) at 02/20/2021 0921 Last data filed at 02/20/2021 0700 Gross per 24 hour  Intake 320 ml  Output --  Net 320 ml        Physical Exam: Vital Signs Blood pressure (!) 158/67, pulse 94, temperature 97.9 F (36.6 C), temperature source Oral, resp. rate 16, height 5\' 8"  (1.727 m), weight 103.4 kg, SpO2 96 %.  Constitutional: No distress . Vital signs reviewed. HEENT: NCAT, EOMI, oral membranes moist Neck: supple Cardiovascular: RRR without murmur. No JVD    Respiratory/Chest: CTA Bilaterally without wheezes or rales. Normal effort    GI/Abdomen: BS +, non-tender, non-distended Ext: no clubbing, cyanosis, or edema Psych: pleasant and cooperative  Skin: No evidence of breakdown, no evidence of rash. Chronic venous changes BLE Scalp incision cdi Musculoskeletal: Full range of motion in all 4 extremities. No joint swelling Neuro:  Pt alert. Follows basic commands. Decreased visual acuity. Displays fair insight and memory. Occasional word finding deficits. Speech fairly clear. Very tangential at times. RUE4/5 prox to distal. LUE 5-/5. RLE 4/5 prox to distal, LLE 4+ to 5-/5. No focal sensory findings. DTR's 1+.  Normal tone    Assessment/Plan: 1. Functional deficits which require 3+ hours per day of interdisciplinary therapy in a  comprehensive inpatient rehab setting. Physiatrist is providing close team supervision and 24 hour management of active medical problems listed below. Physiatrist and rehab team continue to assess barriers to discharge/monitor patient progress toward functional and medical goals  Care Tool:  Bathing    Body parts bathed by patient: Right arm, Left arm, Chest, Abdomen (UB only this am)   Body parts bathed by helper: Left arm, Buttocks, Right lower leg, Left lower leg     Bathing assist Assist Level: Supervision/Verbal cueing     Upper Body Dressing/Undressing Upper body dressing   What is the patient wearing?: Pull over shirt    Upper body assist Assist Level: Minimal Assistance - Patient > 75%    Lower Body Dressing/Undressing Lower body dressing      What is the patient wearing?: Underwear/pull up, Pants     Lower body assist Assist for lower body dressing: Moderate Assistance - Patient 50 - 74%     Toileting Toileting    Toileting assist Assist for toileting: Contact Guard/Touching assist     Transfers Chair/bed transfer  Transfers assist     Chair/bed transfer assist level: Minimal Assistance - Patient > 75%     Locomotion Ambulation   Ambulation assist      Assist level: Contact Guard/Touching  assist Assistive device: No Device Max distance: 150   Walk 10 feet activity   Assist     Assist level: Contact Guard/Touching assist Assistive device: No Device   Walk 50 feet activity   Assist    Assist level: Contact Guard/Touching assist Assistive device: No Device    Walk 150 feet activity   Assist    Assist level: Contact Guard/Touching assist Assistive device: No Device    Walk 10 feet on uneven surface  activity   Assist     Assist level: Minimal Assistance - Patient > 75%     Wheelchair     Assist Is the patient using a wheelchair?: No             Wheelchair 50 feet with 2 turns activity    Assist             Wheelchair 150 feet activity     Assist          Blood pressure (!) 158/67, pulse 94, temperature 97.9 F (36.6 C), temperature source Oral, resp. rate 16, height 5\' 8"  (1.727 m), weight 103.4 kg, SpO2 96 %.  Medical Problem List and Plan: 1.  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  Status post craniotomy evacuation 02/05/2021             -patient may not shower unless covers hair/head             -ELOS/Goals: 12-14 days- supervision  -Continue CIR therapies including PT, OT, and SLP   2.  Antithrombotics: -DVT/anticoagulation:  Pharmaceutical: Other (comment) Eliquis             -antiplatelet therapy: N/A 3. Pain: continue Fioricet, oxycodone  -appears controlled 4. Mood: Provide emotional support  -monitor interactions with staff             -antipsychotic agents: N/A 5. Neuropsych: This patient is capable of making decisions on his own behalf. 6. Skin/Wound Care: staples out of head 7. Fluids/Electrolytes/Nutrition: Routine in and out with follow-up chemistry  -low albumin---daily protein supp  -good PO 8.  Diabetes mellitus peripheral neuropathy.  Hemoglobin A1c 7.3.  NovoLog 3 units 4 times daily with meals, Semglee 10 units daily.     CBG (last 3)  Recent Labs    02/19/21 1708 02/19/21 2113 02/20/21 0609  GLUCAP 133* 192* 151*  12/1 some inconsistency without true pattern. Cover with SSI 9.  CKD stage IV.  Continue torsemide for now, Zaroxolyn on hold.  -Cr in range of 1.9-2.1- stable  10.  Obesity.  BMI 34.97.  Dietary follow-up 11.  Chronic atrial fibrillation.  Cardiac rate controlled.  Eliquis resumed 02/11/2021 12.  Hypertension.  Imdur 30 mg daily, Demadex 60 mg daily   Vitals:   02/19/21 2002 02/20/21 0348  BP: (!) 134/51 (!) 158/67  Pulse: 61 94  Resp: 18 16  Temp: (!) 97.5 F (36.4 C) 97.9 F (36.6 C)  SpO2: 97% 96%  Also on Hydralazine 100mg  TID,   increased Imdur to 60mg  qd.   -   Adjusted imdur to 90mg  starting 11/30 with some improvement 13.  Chronic diastolic congestive heart failure.  Continue Demadex 60 mg daily.  Monitor for any signs of fluid overload  - oxygen at HS scheduled Filed Weights   02/11/21 1549  Weight: 103.4 kg    -need weights qod--ordered 14.  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  Continue eyedrops. 15.  CAD  with history of stenting.  Continue Imdur.  No chest pain or shortness of breath  ?right carotid artery dissection from trauma?-  + flow on MRA earlier this month. No other carotid studies in system  -track bp's with therapy  (keep less than 160/90) 16. Constipation: added colace 100mg  daily.  17. Bradycardia: continue to monitor, will not decrease medications given elevated SBP 18. OSA: schedule oxygen, outpt sleep study    LOS: 9 days A FACE TO FACE EVALUATION WAS PERFORMED  Meredith Staggers 02/20/2021, 9:21 AM

## 2021-02-20 NOTE — Progress Notes (Signed)
Occupational Therapy TBI Note  Patient Details  Name: Kyle Roberson MRN: [id number] Date of Birth: 12-Apr-1940  Today's Date: 02/20/2021 OT Individual Time: 1007-1219 OT Individual Time Calculation (min): 72 min    Short Term Goals: Week 2:  OT Short Term Goal 1 (Week 2): LTG=STG 2/2 ELOS  Skilled Therapeutic Interventions/Progress Updates:    Pt greeted seated in wc and agreeable to OT treatment session. Pt reported need to go to the bathroom. CGA to ambulate into bathroom, supervision for balance to manage clothing. Pt voided bladder, but unable to have BM. Bathing/dressing completed mostly in standing at the sink with overall supervision and intermittent CGA. Min cues to integrate R UE into bathing tasks and dropping the wash cloth less frequently. Pt able to use R hand to brush teeth annd unscrew lids with in cues for technique. Problem solving and R hand fine motor control with slide board puzzle. Pt needed moderate cues initially to understand concept, then able to complete 3 semi-complex patterns without cues. Standing balance/endurance with standing toe taps on 3" step and CGA. Pt returned to room and left seated in wc with alarm belt on, call bell in reach, and needs met.   Therapy Documentation Precautions:  Precautions Precautions: Fall Precaution Comments: Maintain BP <160; HOB >30 degrees Restrictions Weight Bearing Restrictions: Yes Pain:  Denies pain Agitated Behavior Scale: TBI Observation Details Observation Environment: CIR Start of observation period - Date: 02/20/21 Start of observation period - Time: 0930 End of observation period - Date: 02/20/21 End of observation period - Time: 1045 Agitated Behavior Scale (DO NOT LEAVE BLANKS) Short attention span, easy distractibility, inability to concentrate: Present to a slight degree Impulsive, impatient, low tolerance for pain or frustration: Absent Uncooperative, resistant to care, demanding: Absent Violent  and/or threatening violence toward people or property: Absent Explosive and/or unpredictable anger: Absent Rocking, rubbing, moaning, or other self-stimulating behavior: Absent Pulling at tubes, restraints, etc.: Absent Wandering from treatment areas: Absent Restlessness, pacing, excessive movement: Absent Repetitive behaviors, motor, and/or verbal: Absent Rapid, loud, or excessive talking: Absent Sudden changes of mood: Absent Easily initiated or excessive crying and/or laughter: Absent Self-abusiveness, physical and/or verbal: Absent Agitated behavior scale total score: 15   Therapy/Group: Individual Therapy  Valma Cava 02/20/2021, 1:10 PM

## 2021-02-20 NOTE — Progress Notes (Signed)
Physical Therapy TBI Note  Patient Details  Name: Kyle Roberson MRN: [id number] Date of Birth: Feb 16, 1941  Today's Date: 02/20/2021 PT Missed Time: 45 Minutes Missed Time Reason: Patient fatigue;Patient unwilling to participate  Pt received supine in bed with NT exiting upon therapist arrival. Pt reports he is "worn out" and just wants to stay "right where I am." Therapist makes several attempts to engage pt in conversation and redirect attention prior to again encouraging OOB mobility in order to build rapport with patient; however, he continues to report he is too fatigued at this time. Pt polite and appropriate throughout this encounter. Pt left supine in bed with needs in reach and bed alarm on. Missed 45 minutes of skilled physical therapy.   Tawana Scale , PT, DPT, NCS, CSRS  02/20/2021, 3:21 PM

## 2021-02-20 NOTE — Progress Notes (Signed)
Speech Language Pathology TBI Note  Patient Details  Name: Kyle Roberson MRN: [id number] Date of Birth: [birth year]  Today's Date: 02/20/2021 SLP Individual Time: 0815-0900 SLP Individual Time Calculation (min): 45 min  Short Term Goals: Week 2: SLP Short Term Goal 1 (Week 2): Patient will demonstrate functional problem solving for mildly complex tasks with supervision level verbal cues. SLP Short Term Goal 2 (Week 2): Patient will demonstrate selective attention to functional tasks in a mildly distracting enviornment for 45 minutes with Supervision verbal cues for redirection. SLP Short Term Goal 3 (Week 2): Patient will recall new, daily information with supervision level verbal cues. SLP Short Term Goal 4 (Week 2): Patient will utilize speech strategies to minimize phonemic errors during structured tasks with Min verbal cues. SLP Short Term Goal 5 (Week 2): Patient will self-monitor and correct phonemic errors with Min verbal cues.  Skilled Therapeutic Interventions: Skilled treatment session focused on cognitive-linguistic goals. Upon arrival, patient was awake in bed and requested to transfer to the wheelchair. SLP donned patient's TED hoes and pants with total A due to time constraints. Patient requested to use the bathroom and ambulated to the commode with CGA. Patient was able to void. Throughout functional tasks, patient remained verbose with minimal phonemic errors noted. SLP facilitated session by continuing to focus on phonological awareness and cueing with structured drill tasks. Patient completed the task with less errors in a more timely manner compared to previous session. Patient left upright in the wheelchair with his significant other present. Continue with current plan of care.      Pain No/Denies Pain   Agitated Behavior Scale: TBI Observation Details Observation Environment: Patient's room Start of observation period - Date: 02/20/21 Start of observation period -  Time: 0815 End of observation period - Date: 02/20/21 End of observation period - Time: 0900 Agitated Behavior Scale (DO NOT LEAVE BLANKS) Short attention span, easy distractibility, inability to concentrate: Present to a slight degree Impulsive, impatient, low tolerance for pain or frustration: Absent Uncooperative, resistant to care, demanding: Absent Violent and/or threatening violence toward people or property: Absent Explosive and/or unpredictable anger: Absent Rocking, rubbing, moaning, or other self-stimulating behavior: Absent Pulling at tubes, restraints, etc.: Absent Wandering from treatment areas: Absent Restlessness, pacing, excessive movement: Absent Repetitive behaviors, motor, and/or verbal: Absent Rapid, loud, or excessive talking: Absent Sudden changes of mood: Absent Easily initiated or excessive crying and/or laughter: Absent Self-abusiveness, physical and/or verbal: Absent Agitated behavior scale total score: 15  Therapy/Group: Individual Therapy  [name] 02/20/2021, 1:28 PM

## 2021-02-21 LAB — GLUCOSE, CAPILLARY
Glucose-Capillary: 170 mg/dL — ABNORMAL HIGH (ref 70–99)
Glucose-Capillary: 175 mg/dL — ABNORMAL HIGH (ref 70–99)
Glucose-Capillary: 203 mg/dL — ABNORMAL HIGH (ref 70–99)
Glucose-Capillary: 197 mg/dL — ABNORMAL HIGH (ref 70–99)

## 2021-02-21 MED ORDER — INSULIN GLARGINE-YFGN 100 UNIT/ML ~~LOC~~ SOLN
13.0000 [IU] | Freq: Every day | SUBCUTANEOUS | Status: DC
  Filled 2021-02-21: qty 0.13

## 2021-02-21 MED ORDER — INSULIN GLARGINE-YFGN 100 UNIT/ML ~~LOC~~ SOLN
13.0000 [IU] | Freq: Every day | SUBCUTANEOUS | Status: DC
  Administered 2021-02-21 – 2021-02-25 (×5): 13 [IU] via SUBCUTANEOUS
  Filled 2021-02-21 (×5): qty 0.13

## 2021-02-21 NOTE — Progress Notes (Signed)
Occupational Therapy TBI Note  Patient Details  Name: Kyle Roberson MRN: [id number] Date of Birth: 1940-05-02  Today's Date: 02/21/2021 OT Individual Time: 0735-0800 OT Individual Time Calculation (min): 25 min   Short Term Goals: Week 2:  OT Short Term Goal 1 (Week 2): LTG=STG 2/2 ELOS  Skilled Therapeutic Interventions/Progress Updates:    Pt greeted semi-reclined in bed eating breakfast with nurse techs, handoff to OT. OT provided supervision for meal with min cues to integrate R UE into self-feeding task. Pt able to grasp coffee mug handle with R hand and bring to mouth with intermittent guided A from L hand. Pt reported he does not want people watching him eat. OT educated on why he has been supervision for meals, including pt complaints on not having help and previous request from significant other. Pt reports he only needs help to open containers and cut his food. OT discussed with pt what set-up A for meals means and pt reported that all he needs is someone to set him up. Will trial set-up A and intermittent supervision for lunch and see how it goes. Pt completed bed mobility with HOB elevated and supervision. CGA to thread pant legs today, then close supervision for stand-pivot to wc. Pt left seated in wc at end of session with alarm belt on, call bell in reach, and needs met.   Therapy Documentation Precautions:  Precautions Precautions: Fall Precaution Comments: Maintain BP <160; HOB >30 degrees Restrictions Weight Bearing Restrictions: Yes Pain:  Denies pain Agitated Behavior Scale: TBI Observation Details Observation Environment: rOOM Start of observation period - Date: 02/21/21 Start of observation period - Time: 0735 End of observation period - Date: 02/21/21 End of observation period - Time: 0802 Agitated Behavior Scale (DO NOT LEAVE BLANKS) Short attention span, easy distractibility, inability to concentrate: Present to a slight degree Impulsive, impatient, low  tolerance for pain or frustration: Absent Uncooperative, resistant to care, demanding: Absent Violent and/or threatening violence toward people or property: Absent Explosive and/or unpredictable anger: Absent Rocking, rubbing, moaning, or other self-stimulating behavior: Absent Pulling at tubes, restraints, etc.: Absent Wandering from treatment areas: Absent Restlessness, pacing, excessive movement: Absent Repetitive behaviors, motor, and/or verbal: Absent Rapid, loud, or excessive talking: Absent Sudden changes of mood: Absent Easily initiated or excessive crying and/or laughter: Absent Self-abusiveness, physical and/or verbal: Absent Agitated behavior scale total score: 15   Therapy/Group: Individual Therapy  Valma Cava 02/21/2021, 7:57 AM

## 2021-02-21 NOTE — Plan of Care (Signed)
  Problem: RH Memory Goal: LTG Patient will use memory compensatory aids to (SLP) Description: LTG:  Patient will use memory compensatory aids to recall biographical/new, daily complex information with cues (SLP) Flowsheets (Taken 02/21/2021 [phone]) LTG: Patient will use memory compensatory aids to (SLP): Supervision Note: Goal downgraded due to slow progress   Problem: RH Attention Goal: LTG Patient will demonstrate this level of attention during functional activites (SLP) Description: LTG:  Patient will will demonstrate this level of attention during functional activites (SLP) Flowsheets (Taken 02/21/2021 0630) LTG: Patient will demonstrate this level of attention during cognitive/linguistic activities with assistance of (SLP): Supervision Note: Goal downgraded due to slow progress

## 2021-02-21 NOTE — Progress Notes (Signed)
PROGRESS NOTE   Subjective/Complaints: Pt had a good day yesterday. In good spirits. Appetite vigorous. 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   ROS: Patient denies fever, rash, sore throat, blurred vision, nausea, vomiting, diarrhea, cough, shortness of breath or chest pain, joint or back pain, headache, or mood change.   Objective:   No results found. No results for input(s): WBC, HGB, HCT, PLT in the last 72 hours.  No results for input(s): NA, K, CL, CO2, GLUCOSE, BUN, CREATININE, CALCIUM in the last 72 hours.   Intake/Output Summary (Last 24 hours) at 02/21/2021 0919 Last data filed at 02/21/2021 0900 Gross per 24 hour  Intake 880 ml  Output --  Net 880 ml        Physical Exam: Vital Signs Blood pressure (!) 140/58, pulse (!) 51, temperature 98.1 F (36.7 C), temperature source Oral, resp. rate 17, height 5\' 8"  (1.727 m), weight 103.4 kg, SpO2 97 %.  Constitutional: No distress . Vital signs reviewed. HEENT: NCAT, EOMI, oral membranes moist Neck: supple Cardiovascular: RRR without murmur. No JVD    Respiratory/Chest: CTA Bilaterally without wheezes or rales. Normal effort    GI/Abdomen: BS +, non-tender, non-distended Ext: no clubbing, cyanosis, or edema Psych: pleasant and cooperative  Skin: No evidence of breakdown, no evidence of rash. Chronic venous changes BLE Scalp incision cdi with scab Musculoskeletal: Full range of motion in all 4 extremities. No joint swelling Neuro:  Pt alert. Follows basic commands. Decreased visual acuity. Displays fair insight and memory. Occasional word finding deficits. Speech fairly clear. Remains tangential at times. RUE4/5 prox to distal. LUE 5-/5. RLE 4/5 prox to distal, LLE 4+ to 5-/5. No focal sensory findings. DTR's 1+.  Normal tone    Assessment/Plan: 1. Functional deficits which require 3+ hours per day of interdisciplinary therapy in a  comprehensive inpatient rehab setting. Physiatrist is providing close team supervision and 24 hour management of active medical problems listed below. Physiatrist and rehab team continue to assess barriers to discharge/monitor patient progress toward functional and medical goals  Care Tool:  Bathing    Body parts bathed by patient: Right arm, Left arm, Chest, Abdomen (UB only this am)   Body parts bathed by helper: Left arm, Buttocks, Right lower leg, Left lower leg     Bathing assist Assist Level: Supervision/Verbal cueing     Upper Body Dressing/Undressing Upper body dressing   What is the patient wearing?: Pull over shirt    Upper body assist Assist Level: Minimal Assistance - Patient > 75%    Lower Body Dressing/Undressing Lower body dressing      What is the patient wearing?: Underwear/pull up, Pants     Lower body assist Assist for lower body dressing: Moderate Assistance - Patient 50 - 74%     Toileting Toileting    Toileting assist Assist for toileting: Contact Guard/Touching assist     Transfers Chair/bed transfer  Transfers assist     Chair/bed transfer assist level: Minimal Assistance - Patient > 75%     Locomotion Ambulation   Ambulation assist      Assist level: Contact Guard/Touching assist Assistive device: No Device  Max distance: 150   Walk 10 feet activity   Assist     Assist level: Contact Guard/Touching assist Assistive device: No Device   Walk 50 feet activity   Assist    Assist level: Contact Guard/Touching assist Assistive device: No Device    Walk 150 feet activity   Assist    Assist level: Contact Guard/Touching assist Assistive device: No Device    Walk 10 feet on uneven surface  activity   Assist     Assist level: Minimal Assistance - Patient > 75%     Wheelchair     Assist Is the patient using a wheelchair?: No             Wheelchair 50 feet with 2 turns activity    Assist             Wheelchair 150 feet activity     Assist          Blood pressure (!) 140/58, pulse (!) 51, temperature 98.1 F (36.7 C), temperature source Oral, resp. rate 17, height 5\' 8"  (1.727 m), weight 103.4 kg, SpO2 97 %.  Medical Problem List and Plan: 1.  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  Antithrombotics: -DVT/anticoagulation:  Pharmaceutical: Other (comment) Eliquis             -antiplatelet therapy: N/A 3. Pain: continue Fioricet, oxycodone  -appears controlled 4. Mood: Provide emotional support  -monitor interactions with staff             -antipsychotic agents: N/A 5. Neuropsych: This patient is capable of making decisions on his own behalf. 6. Skin/Wound Care: staples out of head 7. Fluids/Electrolytes/Nutrition: Routine in and out with follow-up chemistry  -low albumin---daily protein supp  -good PO 8.  Diabetes mellitus peripheral neuropathy.  Hemoglobin A1c 7.3.  NovoLog 3 units 4 times daily with meals, Semglee 10 units daily.     CBG (last 3)  Recent Labs    02/20/21 1629 02/20/21 2121 02/21/21 0629  GLUCAP 229* 231* 170*  12/2 increase semglee to 13u daily (effective 12/3) 9.  CKD stage IV.  Continue torsemide for now, Zaroxolyn on hold.  -Cr in range of 1.9-2.1- stable  10.  Obesity.  BMI 34.97.  Dietary follow-up 11.  Chronic atrial fibrillation.  Cardiac rate controlled.  Eliquis resumed 02/11/2021 12.  Hypertension.  Imdur 30 mg daily, Demadex 60 mg daily   Vitals:   02/20/21 1952 02/21/21 0324  BP: (!) 165/62 (!) 140/58  Pulse: 61 (!) 51  Resp: 17 17  Temp: 98.2 F (36.8 C) 98.1 F (36.7 C)  SpO2: 96% 97%  Also on Hydralazine 100mg  TID,   increased Imdur to 60mg  qd.   -   Adjusted imdur to 90mg  starting 11/30    12/2 one elevated reading yesterday. Generally showing improvement. Observe for pattern 13.  Chronic diastolic congestive heart failure.  Continue Demadex 60 mg daily.  Monitor for any signs of fluid overload  - oxygen at HS scheduled Filed Weights   02/11/21 1549  Weight: 103.4 kg    -need weights qod--ordered again 14.  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  Continue eyedrops. 15.  CAD with history of stenting.  Continue Imdur.  No chest pain or shortness of breath  ?right carotid artery dissection from trauma?-  + flow on MRA earlier this month. No other carotid studies in system  -track bp's with therapy  (keep less than 160/90) 16. Constipation: added colace 100mg  daily.  17. Bradycardia: continue to monitor, will not decrease medications given elevated SBP 18. OSA: schedule oxygen, outpt sleep study    LOS: 10 days A FACE TO FACE EVALUATION WAS PERFORMED  Meredith Staggers 02/21/2021, 9:19 AM

## 2021-02-21 NOTE — Progress Notes (Signed)
Physical Therapy TBI Note  Patient Details  Name: Kyle Roberson MRN: [id number] Date of Birth: [birth year]  Today's Date: 02/21/2021 PT Individual Time: 0902-1020 PT Individual Time Calculation (min): 78 min   Short Term Goals: Week 1:  PT Short Term Goal 1 (Week 1): Pt will perform bed mobility with minA. PT Short Term Goal 1 - Progress (Week 1): Met PT Short Term Goal 2 (Week 1): Pt will perform bed to chair transfer with CGA. PT Short Term Goal 2 - Progress (Week 1): Met PT Short Term Goal 3 (Week 1): Pt will ambulate x150' with LRAD. PT Short Term Goal 3 - Progress (Week 1): Met  Skilled Therapeutic Interventions/Progress Updates:  Patient seated upright in w/c on entrance to room and drinking coffee. Requires some time to build therapeutic alliance prior to acceptance of this therapist but overall pt is alert and agreeable to PT session. Extensive discussion re: pt's current home life, past vocations, and likes/ dislikes.   Patient with no pain complaint throughout session.  Therapeutic Activity: Transfers: Patient performed sit<>stand and stand pivot transfers throughout session with close supervision/ CGA. Toilet transfer performed with supervision. Required maxA for doffing/ donning new brief. MinA for donning pants. Provided vc for safety.  Gait Training:  Patient ambulated 150' x2/ 17' x2 ft using RW and without AD with CGA/ close supervision. Demonstrated decreased step height/ length overall, increased lateral weight shift, and while in hallway, pt hugs wall to R side without use of HR.  Neuromuscular Re-ed: NMR facilitated during session with focus on standing balance. Pt guided in single rung stepping through agility ladder in order to improve single leg balance and overall proprioception. Directed with vc as to direction of step required. Pt requires seated rest break between bouts. Then guided through obstacle course with 6" hurdles, weaving through cones, stepping on/  off Airex and low step height as well as over yoga blocks.  NMR performed for improvements in motor control and coordination, balance, sequencing, judgement, and self confidence/ efficacy in performing all aspects of mobility at highest level of independence.   Patient seated upright in w/c at end of session with brakes locked, belt alarm set, and all needs within reach.     Therapy Documentation Precautions:  Precautions Precautions: Fall Precaution Comments: Maintain BP <160; HOB >30 degrees Restrictions Weight Bearing Restrictions: Yes General:   Pain:   No pain complaint this session.  Agitated Behavior Scale: TBI Observation Details Observation Environment: CIR Start of observation period - Date: 02/21/21 Start of observation period - Time: 0900 End of observation period - Date: 02/21/21 End of observation period - Time: 1015 Agitated Behavior Scale (DO NOT LEAVE BLANKS) Short attention span, easy distractibility, inability to concentrate: Present to a slight degree Impulsive, impatient, low tolerance for pain or frustration: Absent Uncooperative, resistant to care, demanding: Absent Violent and/or threatening violence toward people or property: Absent Explosive and/or unpredictable anger: Absent Rocking, rubbing, moaning, or other self-stimulating behavior: Absent Pulling at tubes, restraints, etc.: Absent Wandering from treatment areas: Absent Restlessness, pacing, excessive movement: Absent Repetitive behaviors, motor, and/or verbal: Absent Rapid, loud, or excessive talking: Absent Sudden changes of mood: Absent Easily initiated or excessive crying and/or laughter: Absent Self-abusiveness, physical and/or verbal: Absent Agitated behavior scale total score: 15   Therapy/Group: Individual Therapy  Alger Simons PT, DPT 02/21/2021, 12:56 PM

## 2021-02-21 NOTE — Progress Notes (Signed)
Speech Language Pathology TBI Note  Patient Details  Name: Kyle Roberson MRN: [id number] Date of Birth: 21-Nov-1940  Today's Date: 02/21/2021 SLP Individual Time: 1045-1130 SLP Individual Time Calculation (min): 45 min  Short Term Goals: Week 2: SLP Short Term Goal 1 (Week 2): Patient will demonstrate functional problem solving for mildly complex tasks with supervision level verbal cues. SLP Short Term Goal 2 (Week 2): Patient will demonstrate selective attention to functional tasks in a mildly distracting enviornment for 45 minutes with Supervision verbal cues for redirection. SLP Short Term Goal 3 (Week 2): Patient will recall new, daily information with supervision level verbal cues. SLP Short Term Goal 4 (Week 2): Patient will utilize speech strategies to minimize phonemic errors during structured tasks with Min verbal cues. SLP Short Term Goal 5 (Week 2): Patient will self-monitor and correct phonemic errors with Min verbal cues.  Skilled Therapeutic Interventions: Skilled treatment session focused on cognitive-linguistic goals. SLP facilitated session by providing overall Mod A verbal cues for sustained attention to a structured language task due to verbosity and constant use of humor, suspect to mask difficulty of task. Patient required extra time and Mod verbal cues to self-monitor and correct phonemic errors while producing nonword utterances. Cueing was decreased to Min verbal cues while reading aloud at the sentence level while focusing on specific phonemes. During informal conversation, patient with mild phonemic errors that he corrected with extra time. Patient left upright in wheelchair with alarm on and all needs within reach. Continue with current plan of care.      Pain Pain Assessment Pain Scale: 0-10 Pain Score: 0-No pain  Agitated Behavior Scale: TBI Observation Details Observation Environment: Patient's room Start of observation period - Date: 02/21/21 Start of  observation period - Time: 61 End of observation period - Date: 02/21/21 End of observation period - Time: 1130 Agitated Behavior Scale (DO NOT LEAVE BLANKS) Short attention span, easy distractibility, inability to concentrate: Present to a slight degree Impulsive, impatient, low tolerance for pain or frustration: Absent Uncooperative, resistant to care, demanding: Absent Violent and/or threatening violence toward people or property: Absent Explosive and/or unpredictable anger: Absent Rocking, rubbing, moaning, or other self-stimulating behavior: Absent Pulling at tubes, restraints, etc.: Absent Wandering from treatment areas: Absent Restlessness, pacing, excessive movement: Absent Repetitive behaviors, motor, and/or verbal: Absent Rapid, loud, or excessive talking: Absent Sudden changes of mood: Absent Easily initiated or excessive crying and/or laughter: Absent Self-abusiveness, physical and/or verbal: Absent Agitated behavior scale total score: 15  Therapy/Group: Individual Therapy  Amilya Haver 02/21/2021, 12:48 PM

## 2021-02-21 NOTE — Progress Notes (Signed)
Occupational Therapy TBI Note  Patient Details  Name: Kyle Roberson MRN: [id number] Date of Birth: 02-20-1941  Today's Date: 02/21/2021 OT Individual Time: 3149-7026 OT Individual Time Calculation (min): 57 min    Short Term Goals: Week 2:  OT Short Term Goal 1 (Week 2): LTG=STG 2/2 ELOS  Skilled Therapeutic Interventions/Progress Updates:    Pt greeted seated in wc after eating lunch and agreeable to OT treatment session. Pt reported lunch went well with only set-up A. Pt brought to therapy gym and worked on standing balance/endurance standing on foam mat to activate balance strategies. Pt completed peg  board puzzle using R hand with min cues for accuracy and focus on in-hand manipulation of pegs. Pt with increased difficulty perceiving peg placement when starting point was not at the edge of the mat. Pt completed obstacle course stepping over objects and round cones with CGA overall and seated rest breaks in between trials. Pt returned to room and left seated in wc with alarm belt on, call bell in reach, and needs met.   Therapy Documentation Precautions:  Precautions Precautions: Fall Precaution Comments: Maintain BP <160; HOB >30 degrees Restrictions Weight Bearing Restrictions: Yes Pain:  Denies pain Agitated Behavior Scale: TBI Observation Details Observation Environment: Room Start of observation period - Date: 02/21/21 Start of observation period - Time: 1300 End of observation period - Date: 02/21/21 End of observation period - Time: 1200 Agitated Behavior Scale (DO NOT LEAVE BLANKS) Short attention span, easy distractibility, inability to concentrate: Present to a slight degree Impulsive, impatient, low tolerance for pain or frustration: Absent Uncooperative, resistant to care, demanding: Absent Violent and/or threatening violence toward people or property: Absent Explosive and/or unpredictable anger: Absent Rocking, rubbing, moaning, or other self-stimulating  behavior: Absent Pulling at tubes, restraints, etc.: Absent Wandering from treatment areas: Absent Restlessness, pacing, excessive movement: Absent Repetitive behaviors, motor, and/or verbal: Absent Rapid, loud, or excessive talking: Absent Sudden changes of mood: Absent Easily initiated or excessive crying and/or laughter: Absent Self-abusiveness, physical and/or verbal: Absent Agitated behavior scale total score: 15   Therapy/Group: Individual Therapy  Valma Cava 02/21/2021, 2:03 PM

## 2021-02-22 LAB — GLUCOSE, CAPILLARY
Glucose-Capillary: 176 mg/dL — ABNORMAL HIGH (ref 70–99)
Glucose-Capillary: 184 mg/dL — ABNORMAL HIGH (ref 70–99)
Glucose-Capillary: 182 mg/dL — ABNORMAL HIGH (ref 70–99)
Glucose-Capillary: 158 mg/dL — ABNORMAL HIGH (ref 70–99)

## 2021-02-22 NOTE — Progress Notes (Signed)
Physical Therapy Session Note  Patient Details  Name: Kyle Roberson MRN: [id number] Date of Birth: 03-Jul-1940  Today's Date: 02/22/2021 PT Individual Time:  -      Short Term Goals: Week 1:  PT Short Term Goal 1 (Week 1): Pt will perform bed mobility with minA. PT Short Term Goal 1 - Progress (Week 1): Met PT Short Term Goal 2 (Week 1): Pt will perform bed to chair transfer with CGA. PT Short Term Goal 2 - Progress (Week 1): Met PT Short Term Goal 3 (Week 1): Pt will ambulate x150' with LRAD. PT Short Term Goal 3 - Progress (Week 1): Met Week 2:  PT Short Term Goal 1 (Week 2): STGs = LTGs  Skilled Therapeutic Interventions/Progress Updates:      Therapy Documentation Precautions:  Precautions Precautions: Fall Precaution Comments: Maintain BP <160; HOB >30 degrees Restrictions Weight Bearing Restrictions: Yes General:   Vital Signs: Therapy Vitals Temp: 98 F (36.7 C) Pulse Rate: 95 Resp: 18 BP: (!) 138/59 Patient Position (if appropriate): Lying Oxygen Therapy SpO2: 98 % O2 Device: Room Air Pain:   Mobility:   Locomotion :    Trunk/Postural Assessment :    Balance:   Exercises:   Other Treatments:      Therapy/Group: Individual Therapy  Jackston Oaxaca 02/22/2021, 7:53 AM

## 2021-02-22 NOTE — Progress Notes (Signed)
Physical Therapy TBI Note  Patient Details  Name: Kyle Roberson MRN: [id number] Date of Birth: 1940-04-18  Today's Date: 02/22/2021 PT Individual Time: 0904-1004 PT Individual Time Calculation (min): 60 min   Short Term Goals: Week 1:  PT Short Term Goal 1 (Week 1): Pt will perform bed mobility with minA. PT Short Term Goal 1 - Progress (Week 1): Met PT Short Term Goal 2 (Week 1): Pt will perform bed to chair transfer with CGA. PT Short Term Goal 2 - Progress (Week 1): Met PT Short Term Goal 3 (Week 1): Pt will ambulate x150' with LRAD. PT Short Term Goal 3 - Progress (Week 1): Met Week 2:  PT Short Term Goal 1 (Week 2): STGs = LTGs  Skilled Therapeutic Interventions/Progress Updates:  Pt resting in bed.  He denied pain.  PT donned TEDS and non slip socks.  PT threaded pants onto pt's LEs and pt pulled them up with min assist, via bridging and rolling.  neuromuscular re-education and stretching bil hip external rotationvia set up, multimodal cues for supine/ hooklying with HOB raised to 30 degrees and feet apart: lower trunk rotation, bil adductor squeezes, bil bridging with adductor squeezes. Focus on eccentric control. In sitting, bil heel raises.   Bed mobility training for rolling in flat bed, no rails, with cues for technique, x 5 R/L.  L side lying to sitting iwht min assist for elevating trunk.  Pt reported that he has slept in a recliner for at least 2 years.   Sit> stand from bed with close supervision. Gait in room to toilet with close supervision, no AD. Toilet transfer using wall bars, which he reported he has at home, close supervision.  Pt continent of urine.  Sit> stand with supervision.  Min assist for clothing mgt. BP in sitting , L UE 161/62.   Advanced gait training without AD, transporting in L hand his cardboard bath bin filled with hygiene items, x 100', x 150'.  Upon returning to room, pt placed bin on dresser requiring leaning forward over edge of wc, with  resulting LOB requiring min assist to regain.  Seated BP 174/68 in R UE at pt's request.  PT called LPN, who found BP 409/81 in R UE after resting a couple of minutes.  She stated that he had had his BP meds this AM.  Nsg will monitor. Pt without c/o dizziness or HA.   During this session, pt tended to focus on previous bad experiences that he has had in hospitals and clinics.  He responded to redirection.  At end of session, pt seated in wc with needs at hand and seat belt alarm set.       Therapy Documentation Precautions:  Precautions Precautions: Fall Precaution Comments: Maintain BP <160; HOB >30 degrees Restrictions Weight Bearing Restrictions: Yes  Pain Assessment Pain Scale: 0-10 Pain Score: 0-No pain Agitated Behavior Scale: TBI Observation Details Observation Environment: CIR Start of observation period - Date: 02/22/21 Start of observation period - Time: 0904 End of observation period - Date: 02/22/21 End of observation period - Time: 1004 Agitated Behavior Scale (DO NOT LEAVE BLANKS) Short attention span, easy distractibility, inability to concentrate: Present to a slight degree Impulsive, impatient, low tolerance for pain or frustration: Absent Uncooperative, resistant to care, demanding: Absent Violent and/or threatening violence toward people or property: Absent Explosive and/or unpredictable anger: Absent Rocking, rubbing, moaning, or other self-stimulating behavior: Absent Pulling at tubes, restraints, etc.: Absent Wandering from treatment areas: Absent Restlessness, pacing, excessive  movement: Absent Repetitive behaviors, motor, and/or verbal: Absent Rapid, loud, or excessive talking: Absent Sudden changes of mood: Absent Easily initiated or excessive crying and/or laughter: Absent Self-abusiveness, physical and/or verbal: Absent Agitated behavior scale total score: 15       Therapy/Group: Individual Therapy  Shahram Alexopoulos 02/22/2021, 10:23 AM

## 2021-02-23 DIAGNOSIS — E1142 Type 2 diabetes mellitus with diabetic polyneuropathy: Secondary | ICD-10-CM

## 2021-02-23 DIAGNOSIS — I10 Essential (primary) hypertension: Secondary | ICD-10-CM

## 2021-02-23 DIAGNOSIS — N184 Chronic kidney disease, stage 4 (severe): Secondary | ICD-10-CM

## 2021-02-23 LAB — GLUCOSE, CAPILLARY
Glucose-Capillary: 143 mg/dL — ABNORMAL HIGH (ref 70–99)
Glucose-Capillary: 180 mg/dL — ABNORMAL HIGH (ref 70–99)
Glucose-Capillary: 211 mg/dL — ABNORMAL HIGH (ref 70–99)
Glucose-Capillary: 206 mg/dL — ABNORMAL HIGH (ref 70–99)

## 2021-02-23 NOTE — Progress Notes (Signed)
PROGRESS NOTE   Subjective/Complaints: Patient seen sitting up in bed this AM.  He states he did not sleep well overnight and is upset, but does not want to discuss it.  He states he would rather "deal with it".  Initially irritated, but later pleasant.  He is looking forward to being discharged soon.   ROS: Denies CP, SOB, N/V/D  Objective:   No results found. No results for input(s): WBC, HGB, HCT, PLT in the last 72 hours.  No results for input(s): NA, K, CL, CO2, GLUCOSE, BUN, CREATININE, CALCIUM in the last 72 hours.   Intake/Output Summary (Last 24 hours) at 02/23/2021 1813 Last data filed at 02/23/2021 1200 Gross per 24 hour  Intake 960 ml  Output 500 ml  Net 460 ml         Physical Exam: Vital Signs Blood pressure 128/63, pulse (!) 54, temperature 97.8 F (36.6 C), resp. rate 18, height 5\' 8"  (1.727 m), weight 99.5 kg, SpO2 98 %. Constitutional: No distress . Vital signs reviewed. HENT: Normocephalic.  Atraumatic. Eyes: EOMI. No discharge. Cardiovascular: No JVD.  Bradycardia.Marland Kitchen Respiratory: Normal effort.  No stridor.  Bilateral clear to auscultation. GI: Non-distended.  BS +. Skin: Warm and dry.  Intact. Psych: Normal mood.  Normal behavior. Musc: No edema in extremities.  No tenderness in extremities. Neuro: Alert Motor: RUE: 4/5 prox to distal. LUE 5-/5. RLE 4/5 prox to distal, LLE 4+ to 5-/5, stable  Assessment/Plan: 1. Functional deficits which require 3+ hours per day of interdisciplinary therapy in a comprehensive inpatient rehab setting. Physiatrist is providing close team supervision and 24 hour management of active medical problems listed below. Physiatrist and rehab team continue to assess barriers to discharge/monitor patient progress toward functional and medical goals  Care Tool:  Bathing    Body parts bathed by patient: Right arm, Left arm, Chest, Abdomen (UB only this am)   Body parts  bathed by helper: Left arm, Buttocks, Right lower leg, Left lower leg     Bathing assist Assist Level: Supervision/Verbal cueing     Upper Body Dressing/Undressing Upper body dressing   What is the patient wearing?: Pull over shirt    Upper body assist Assist Level: Minimal Assistance - Patient > 75%    Lower Body Dressing/Undressing Lower body dressing      What is the patient wearing?: Underwear/pull up, Pants     Lower body assist Assist for lower body dressing: Moderate Assistance - Patient 50 - 74%     Toileting Toileting    Toileting assist Assist for toileting: Contact Guard/Touching assist     Transfers Chair/bed transfer  Transfers assist     Chair/bed transfer assist level: Minimal Assistance - Patient > 75%     Locomotion Ambulation   Ambulation assist      Assist level: Supervision/Verbal cueing Assistive device: No Device Max distance: 150   Walk 10 feet activity   Assist     Assist level: Supervision/Verbal cueing Assistive device: No Device   Walk 50 feet activity   Assist    Assist level: Supervision/Verbal cueing Assistive device: No Device    Walk 150 feet activity   Assist  Assist level: Supervision/Verbal cueing Assistive device: No Device    Walk 10 feet on uneven surface  activity   Assist     Assist level: Minimal Assistance - Patient > 75%     Wheelchair     Assist Is the patient using a wheelchair?: No             Wheelchair 50 feet with 2 turns activity    Assist            Wheelchair 150 feet activity     Assist          Blood pressure 128/63, pulse (!) 54, temperature 97.8 F (36.6 C), resp. rate 18, height 5\' 8"  (1.727 m), weight 99.5 kg, SpO2 98 %.  Medical Problem List and Plan: 1.  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  Status post craniotomy evacuation 02/05/2021 Continue CIR 2.   Antithrombotics: -DVT/anticoagulation:  Pharmaceutical: Other (comment) Eliquis             -antiplatelet therapy: N/A 3. Pain: continue Fioricet, oxycodone  Controlled with meds on 12/4 4. Mood: Provide emotional support  -monitor interactions with staff             -antipsychotic agents: N/A 5. Neuropsych: This patient is capable of making decisions on his own behalf. 6. Skin/Wound Care: staples out of head 7. Fluids/Electrolytes/Nutrition: Routine in and out  -low albumin---daily protein supp 8.  Diabetes mellitus peripheral neuropathy.  Hemoglobin A1c 7.3.  NovoLog 3 units 4 times daily with meals  CBG (last 3)  Recent Labs    02/23/21 0620 02/23/21 1126 02/23/21 1632  GLUCAP 143* 180* 211*   Increased semglee to 13u daily on 12/2 Remains elevated on 12/4, will not make any changes today, given recent increase, however, will likely need to increase further 9.  CKD stage IV.  Continue torsemide for now, Zaroxolyn on hold.  -Cr 2.0 on 12/4, labs ordered for tomorrow 10.  Obesity.  BMI 34.97.  Dietary follow-up 11.  Chronic atrial fibrillation.  Cardiac rate controlled.  Eliquis resumed 02/11/2021 12.  Hypertension.  Imdur 30 mg daily, Demadex 60 mg daily   Vitals:   02/23/21 0400 02/23/21 1324  BP: (!) 166/68 128/63  Pulse: (!) 44 (!) 54  Resp: 14 18  Temp: 98.2 F (36.8 C) 97.8 F (36.6 C)  SpO2: 98% 98%  Also on Hydralazine 100mg  TID  Adjusted imdur to 90mg  starting 11/30    Improving control on 12/4 13.  Chronic diastolic congestive heart failure.  Continue Demadex 60 mg daily.  Monitor for any signs of fluid overload  - oxygen at HS scheduled Filed Weights   02/11/21 1549 02/21/21 1034  Weight: 103.4 kg 99.5 kg    -need weights qod--ordered again 14.  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  Continue eyedrops. 15.  CAD with history of stenting.  Continue Imdur.  No chest pain or shortness of breath  ?right carotid artery dissection from  trauma?-  + flow on MRA earlier this month. No other carotid studies in system  -track bp's with therapy  (keep less than 160/90) 16. Constipation: added colace 100mg  daily.  17. Bradycardia: continue to monitor, will not decrease medications given elevated SBP 18. OSA: schedule oxygen, outpt sleep study    LOS: 12 days A FACE TO FACE EVALUATION WAS PERFORMED  Kyle Roberson Lorie Phenix 02/23/2021, 6:13 PM

## 2021-02-23 NOTE — Progress Notes (Signed)
This nurse attempted to obtain patient's weight per md order. Patient refused x2; patient was not pleasant at this time. Notified Dr. Posey Pronto.

## 2021-02-24 LAB — BASIC METABOLIC PANEL
Anion gap: 9 (ref 5–15)
BUN: 53 mg/dL — ABNORMAL HIGH (ref 8–23)
CO2: 27 mmol/L (ref 22–32)
Calcium: 8.5 mg/dL — ABNORMAL LOW (ref 8.9–10.3)
Chloride: 100 mmol/L (ref 98–111)
Creatinine, Ser: 2.35 mg/dL — ABNORMAL HIGH (ref 0.61–1.24)
GFR, Estimated: 27 mL/min — ABNORMAL LOW (ref >60)
Glucose, Bld: 209 mg/dL — ABNORMAL HIGH (ref 70–99)
Potassium: 3.5 mmol/L (ref 3.5–5.1)
Sodium: 136 mmol/L (ref 135–145)

## 2021-02-24 LAB — GLUCOSE, CAPILLARY
Glucose-Capillary: 194 mg/dL — ABNORMAL HIGH (ref 70–99)
Glucose-Capillary: 180 mg/dL — ABNORMAL HIGH (ref 70–99)
Glucose-Capillary: 158 mg/dL — ABNORMAL HIGH (ref 70–99)
Glucose-Capillary: 210 mg/dL — ABNORMAL HIGH (ref 70–99)

## 2021-02-24 LAB — CBC
HCT: 37.1 % — ABNORMAL LOW (ref 39.0–52.0)
Hemoglobin: 11.8 g/dL — ABNORMAL LOW (ref 13.0–17.0)
MCH: 29.3 pg (ref 26.0–34.0)
MCHC: 31.8 g/dL (ref 30.0–36.0)
MCV: 92.1 fL (ref 80.0–100.0)
Platelets: 210 10*3/uL (ref 150–400)
RBC: 4.03 MIL/uL — ABNORMAL LOW (ref 4.22–5.81)
RDW: 13.4 % (ref 11.5–15.5)
WBC: 8.3 10*3/uL (ref 4.0–10.5)
nRBC: 0 % (ref 0.0–0.2)

## 2021-02-24 NOTE — Progress Notes (Signed)
PROGRESS NOTE   Subjective/Complaints: Says he feels like he's in [employer] and [employer] psychologist. A little frustrated this morning, mostly just anxious to get home. Eating breakfast, SO at bedside when I entered.   ROS: Patient denies fever, rash, sore throat, blurred vision, nausea, vomiting, diarrhea, cough, shortness of breath or chest pain, joint or back pain.   Objective:   No results found. Recent Labs    02/24/21 0537  WBC 8.3  HGB 11.8*  HCT 37.1*  PLT 210    Recent Labs    02/24/21 0537  NA 136  K 3.5  CL 100  CO2 27  GLUCOSE 209*  BUN 53*  CREATININE 2.35*  CALCIUM 8.5*     Intake/Output Summary (Last 24 hours) at 02/24/2021 1034 Last data filed at 02/24/2021 0805 Gross per 24 hour  Intake 942 ml  Output 200 ml  Net 742 ml        Physical Exam: Vital Signs Blood pressure (!) 165/51, pulse 61, temperature 98.2 F (36.8 C), resp. rate 14, height 5\' 8"  (1.727 m), weight 97.6 kg, SpO2 99 %. Constitutional: No distress . Vital signs reviewed. HEENT: NCAT, EOMI, oral membranes moist Neck: supple Cardiovascular: RRR without murmur. No JVD    Respiratory/Chest: CTA Bilaterally without wheezes or rales. Normal effort    GI/Abdomen: BS +, non-tender, non-distended Ext: no clubbing, cyanosis, or edema Psych: irritable but cooperative. Skin: Warm and dry.  Incision CDI Musc: No edema in extremities.  No tenderness in extremities. Neuro: Alert, fair insight and awareness. Some word finding deficits.  Motor: RUE: 4/5 prox to distal. LUE 5-/5. RLE 4/5 prox to distal, LLE 4+ to 5-/5, stable  Assessment/Plan: 1. Functional deficits which require 3+ hours per day of interdisciplinary therapy in a comprehensive inpatient rehab setting. Physiatrist is providing close team supervision and 24 hour management of active medical problems listed below. Physiatrist and rehab team continue to assess barriers to discharge/monitor  patient progress toward functional and medical goals  Care Tool:  Bathing    Body parts bathed by patient: Right arm, Left arm, Chest, Abdomen (UB only this am)   Body parts bathed by helper: Left arm, Buttocks, Right lower leg, Left lower leg     Bathing assist Assist Level: Supervision/Verbal cueing     Upper Body Dressing/Undressing Upper body dressing   What is the patient wearing?: Pull over shirt    Upper body assist Assist Level: Minimal Assistance - Patient > 75%    Lower Body Dressing/Undressing Lower body dressing      What is the patient wearing?: Underwear/pull up, Pants     Lower body assist Assist for lower body dressing: Moderate Assistance - Patient 50 - 74%     Toileting Toileting    Toileting assist Assist for toileting: Contact Guard/Touching assist     Transfers Chair/bed transfer  Transfers assist     Chair/bed transfer assist level: Minimal Assistance - Patient > 75%     Locomotion Ambulation   Ambulation assist      Assist level: Supervision/Verbal cueing Assistive device: No Device Max distance: 150   Walk 10 feet activity   Assist     Assist level:  Supervision/Verbal cueing Assistive device: No Device   Walk 50 feet activity   Assist    Assist level: Supervision/Verbal cueing Assistive device: No Device    Walk 150 feet activity   Assist    Assist level: Supervision/Verbal cueing Assistive device: No Device    Walk 10 feet on uneven surface  activity   Assist     Assist level: Minimal Assistance - Patient > 75%     Wheelchair     Assist Is the patient using a wheelchair?: No             Wheelchair 50 feet with 2 turns activity    Assist            Wheelchair 150 feet activity     Assist          Blood pressure (!) 165/51, pulse 61, temperature 98.2 F (36.8 C), resp. rate 14, height 5\' 8"  (1.727 m), weight 97.6 kg, SpO2 99 %.  Medical Problem List and Plan: 1.   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  Status post craniotomy evacuation 02/05/2021 -Continue CIR therapies including PT, OT, and SLP  2.  Antithrombotics: -DVT/anticoagulation:  Pharmaceutical: Other (comment) Eliquis             -antiplatelet therapy: N/A 3. Pain: continue Fioricet, oxycodone  Controlled with meds on 12/4 4. Mood: Provide emotional support  -monitor interactions with staff             -antipsychotic agents: N/A 5. Neuropsych: This patient is capable of making decisions on his own behalf. 6. Skin/Wound Care: staples out of head 7. Fluids/Electrolytes/Nutrition: Routine in and out  -low albumin---dc supp as pt refusing 8.  Diabetes mellitus peripheral neuropathy.  Hemoglobin A1c 7.3.  NovoLog 3 units 4 times daily with meals  CBG (last 3)  Recent Labs    02/23/21 1632 02/23/21 2101 02/24/21 0544  GLUCAP 211* 206* 194*  Increased semglee to 13u daily on 12/2 12/5, increase semglee to 16u tomorrow 9.  CKD stage IV.  Continue torsemide for now, Zaroxolyn on hold.  -Cr 2.0 on 11/27-->2.35 today, hold torsemide d/t AKI   -recheck tomorrow 12/6 10.  Obesity.  BMI 34.97.  Dietary follow-up 11.  Chronic atrial fibrillation.  Cardiac rate controlled.  Eliquis resumed 02/11/2021 12.  Hypertension.  Imdur 30 mg daily, Demadex 60 mg daily   Vitals:   02/24/21 0444 02/24/21 0445  BP: (!) 165/51   Pulse: (!) 45 61  Resp:    Temp:    SpO2: 99% 99%  Also on Hydralazine 100mg  TID  Adjusted imdur to 90mg  starting 11/30    Improving control on 12/5 13.  Chronic diastolic congestive heart failure.  Continue Demadex 60 mg daily.  Monitor for any signs of fluid overload  - oxygen at HS scheduled Filed Weights   02/11/21 1549 02/21/21 1034 02/24/21 0250  Weight: 103.4 kg 99.5 kg 97.6 kg    -weights down (based on 2 readings)  -hold torsemide (see above) 14.  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   Continue eyedrops. 15.  CAD with history of stenting.  Continue Imdur.  No chest pain or shortness of breath  ?right carotid artery dissection from trauma?-  + flow on MRA earlier this month. No other carotid studies in system   16. Constipation: added colace 100mg  daily.  17. Bradycardia: continue to monitor, will not decrease medications given elevated SBP 18. OSA: schedule  oxygen, outpt sleep study    LOS: 13 days A FACE TO FACE EVALUATION WAS PERFORMED  Meredith Staggers 02/24/2021, 10:34 AM

## 2021-02-24 NOTE — Progress Notes (Signed)
Physical Therapy TBI Note  Patient Details  Name: Keiron Iodice MRN: [id number] Date of Birth: [birth year]  Today's Date: 02/24/2021 PT Individual Time: 1103-1200 PT Individual Time Calculation (min): 57 min   Short Term Goals: Week 2:  PT Short Term Goal 1 (Week 2): STGs = LTGs  Skilled Therapeutic Interventions/Progress Updates:     Patient in w/c with his SO, [name], in the room upon PT arrival. Patient alert and agreeable to PT session. Patient denied pain during session. [name] participated in hands-on family training and education throughout session. [name] demonstrated safe guarding at supervision level for all mobility with patient. Reports that she was provided much of the same assistance PTA. Discussed B lower extremity edema management with use of compression socks and elevation. Patient used slippers for gait at home, plan to trial slippers, as they are in the patient's room, prior to d/c.   Provided education on TBI symptoms and recovery, signs indicating a decline in neurologic status warranting MD consult or emergency services, behaviors changes or concerns, sleep patterns and fatigue, and signs of overstimulation. Will provide education handouts prior to d/c, patient and [name] appreciative of education. [name] asked about craniotomy helmets for patient's safety. PT reported that this is not a necessity at this time, however, encouraged her and the patient to look into them for piece of mind. Educated on fall risk/prevention, home modifications to prevent falls, and activation of emergency services in the event of a fall during session. [name] reports that the patient sleeps in a recliner at night and does not use the bed. Asked about follow-up for sleep apnea, differed to medical team at this time and informed them that this will likely be an outpatient follow-up.   Therapeutic Activity: Transfers: Patient performed sit to/from stand from the w/c x2 and from a standard chair without arms x2 with  supervision. Provided verbal cues for increased forward weight shift when standing from a chair without arms. Patient performed a simulated mid-size SUV height car transfer with supervision without assistive device. Provided cues for safe technique, as patient initially attempted step-in technique, however, educated on reduced fall risk with turn and sit technique for reduced SLS time during transfer.  Gait Training:  Patient ambulated >200 feet x2 without an AD with close supervision. Ambulated with decreased gait speed, decreased step length and height, R trendelenburg with lateral hip instability, mild forward trunk lean, and decreased visual scanning. Provided verbal cues for increased heel strike at initial contact for increased step height and reduced anterior bias for improved balance and increased arm swing for improved gait speed and balance. Patient ambulated up/down a 10 foot ramp to simulate home entry and unlevel surfaces with close supervision using without AD.   Patient ambulating to the bathroom with [name] at end of session. OT cleared [name] for bathroom transfers with in room ambulation with patient prior to PT session.    Therapy Documentation Precautions:  Precautions Precautions: Fall Precaution Comments: Maintain BP <160; HOB >30 degrees Restrictions Weight Bearing Restrictions: No Agitated Behavior Scale: TBI Observation Details Observation Environment: Room Start of observation period - Date: 02/24/21 Start of observation period - Time: 1103 End of observation period - Date: 02/24/21 End of observation period - Time: 1200 Agitated Behavior Scale (DO NOT LEAVE BLANKS) Short attention span, easy distractibility, inability to concentrate: Present to a slight degree Impulsive, impatient, low tolerance for pain or frustration: Absent Uncooperative, resistant to care, demanding: Absent Violent and/or threatening violence toward people or property: Absent Explosive  and/or  unpredictable anger: Absent Rocking, rubbing, moaning, or other self-stimulating behavior: Absent Pulling at tubes, restraints, etc.: Absent Wandering from treatment areas: Absent Restlessness, pacing, excessive movement: Absent Repetitive behaviors, motor, and/or verbal: Absent Rapid, loud, or excessive talking: Absent Sudden changes of mood: Absent Easily initiated or excessive crying and/or laughter: Absent Self-abusiveness, physical and/or verbal: Absent Agitated behavior scale total score: 14    Therapy/Group: Individual Therapy  Waldo Damian L Johnwesley Lederman PT, DPT  02/24/2021, 4:38 PM

## 2021-02-24 NOTE — Progress Notes (Addendum)
Family Education:  Patient alert and oriented x4, pleasant. Significant other [name]/POA present for family education. This nurse reviewed each current medication, dose, route, frequency, and purpose with patient and pam.. Patient verbalized understanding. [name] was very receptive and knowledgeable of patient's medication regimen. [name] taught back home diet/regimen that follows current dx's. [name] stated she weight's every day at home due to patient's dx of chf. Marlowe Kays, LPN

## 2021-02-24 NOTE — Progress Notes (Signed)
Speech Language Pathology TBI Note  Patient Details  Name: Kyle Roberson MRN: [id number] Date of Birth: June 19, 1940  Today's Date: 02/24/2021 SLP Individual Time: 1000-1045 SLP Individual Time Calculation (min): 45 min  Short Term Goals: Week 2: SLP Short Term Goal 1 (Week 2): Patient will demonstrate functional problem solving for mildly complex tasks with supervision level verbal cues. SLP Short Term Goal 2 (Week 2): Patient will demonstrate selective attention to functional tasks in a mildly distracting enviornment for 45 minutes with Supervision verbal cues for redirection. SLP Short Term Goal 3 (Week 2): Patient will recall new, daily information with supervision level verbal cues. SLP Short Term Goal 4 (Week 2): Patient will utilize speech strategies to minimize phonemic errors during structured tasks with Min verbal cues. SLP Short Term Goal 5 (Week 2): Patient will self-monitor and correct phonemic errors with Min verbal cues.  Skilled Therapeutic Interventions: Skilled treatment session focused on completion of family education with the patient and his significant other. SLP facilitated session by providing education regarding patient's current cognitive functioning and strategies to utilize at home to maximize his short-term recall, attention and overall safety at home. SLP also facilitated session by providing strategies to maximize word-finding and minimize phonemic paraphasias such as slowing down.  Patient also participated in a language assessment. Patient demonstrated difficulty following complex commands but suspect difficulty is due to decreased working memory and attention to task. Assessment was unable to be completed due to time constraints, therefore, will be completed during next session. Patient left upright in wheelchair with all needs within reach. Continue with current plan of care.       Pain No/Denies Pain   Agitated Behavior Scale: TBI Observation  Details Observation Environment: Room Start of observation period - Date: 02/24/21 Start of observation period - Time: 0900 End of observation period - Date: 02/24/21 End of observation period - Time: 1000 Agitated Behavior Scale (DO NOT LEAVE BLANKS) Short attention span, easy distractibility, inability to concentrate: Absent Impulsive, impatient, low tolerance for pain or frustration: Absent Uncooperative, resistant to care, demanding: Absent Violent and/or threatening violence toward people or property: Absent Explosive and/or unpredictable anger: Absent Rocking, rubbing, moaning, or other self-stimulating behavior: Absent Pulling at tubes, restraints, etc.: Absent Wandering from treatment areas: Absent Restlessness, pacing, excessive movement: Absent Repetitive behaviors, motor, and/or verbal: Absent Rapid, loud, or excessive talking: Absent Sudden changes of mood: Absent Easily initiated or excessive crying and/or laughter: Absent Self-abusiveness, physical and/or verbal: Absent Agitated behavior scale total score: 14  Therapy/Group: Individual Therapy  Errica Dutil 02/24/2021, 12:14 PM

## 2021-02-24 NOTE — Progress Notes (Signed)
Occupational Therapy TBI Note  Patient Details  Name: Kyle Roberson MRN: [id number] Date of Birth: 1940-04-29  Today's Date: 02/24/2021 OT Individual Time: 0903-1000 OT Individual Time Calculation (min): 57 min    Short Term Goals: Week 2:  OT Short Term Goal 1 (Week 2): LTG=STG 2/2 ELOS  Skilled Therapeutic Interventions/Progress Updates:    Pt greeted seated EOB with significant other [name] present for family education. [name] had questions regarding use of a helmet at home or when ambulating in community. OT educated that patient did not need one but if she felt more comfortable with wearing one I could show her some options on [place]. OT pulled up some crani helmets on [place] and educated on wear. OT discussed safety awarness and activity modification with pt and sig other. [name] provided close supervision/CGA for ambulatory transfer into bathroom. Pt voided bladder and completed peri-care w/ supervision. Bathing.dressing tasks completed sit<>stand from wc with education on continued use of R hand for neuro re-ed. Pt only dropped wash cloth 1x when using R hand and was able to brush teeth efficiently with R hand today as well. Pt needed assistance to thread brief, but was able to thread pants and pull them up in standing with supervision. OT educated on safety with walk-in shower transfer at home as well. Pt left seated in wc with alarm belt on, call bell in reach, spouse present, and needs met.   Therapy Documentation Precautions:  Precautions Precautions: Fall Precaution Comments: Maintain BP <160; HOB >30 degrees Restrictions Weight Bearing Restrictions: No Pain:  Denies pain Agitated Behavior Scale: TBI Observation Details Observation Environment: Room Start of observation period - Date: 02/24/21 Start of observation period - Time: 0900 End of observation period - Date: 02/24/21 End of observation period - Time: 1000 Agitated Behavior Scale (DO NOT LEAVE BLANKS) Short attention  span, easy distractibility, inability to concentrate: Absent Impulsive, impatient, low tolerance for pain or frustration: Absent Uncooperative, resistant to care, demanding: Absent Violent and/or threatening violence toward people or property: Absent Explosive and/or unpredictable anger: Absent Rocking, rubbing, moaning, or other self-stimulating behavior: Absent Pulling at tubes, restraints, etc.: Absent Wandering from treatment areas: Absent Restlessness, pacing, excessive movement: Absent Repetitive behaviors, motor, and/or verbal: Absent Rapid, loud, or excessive talking: Absent Sudden changes of mood: Absent Easily initiated or excessive crying and/or laughter: Absent Self-abusiveness, physical and/or verbal: Absent Agitated behavior scale total score: 14   Therapy/Group: Individual Therapy  Valma Cava 02/24/2021, 10:09 AM

## 2021-02-24 NOTE — Progress Notes (Signed)
Occupational Therapy Session Note  Patient Details  Name: Kyle Roberson MRN: [id number] Date of Birth: Jul 08, 1940  Today's Date: 02/24/2021 OT Individual Time: 1531-1630 OT Individual Time Calculation (min): 59 min    Short Term Goals: Week 1:  OT Short Term Goal 1 (Week 1): Patient will complete toilet transfers with Min A and LRAD. OT Short Term Goal 1 - Progress (Week 1): Met OT Short Term Goal 2 (Week 1): Patient will complete 3/3 parts of toileting task with Min A, AE and LRAD. OT Short Term Goal 2 - Progress (Week 1): Met OT Short Term Goal 3 (Week 1): Patient will complete LB dressing with Min A and LRAD. OT Short Term Goal 3 - Progress (Week 1): Met Week 2:  OT Short Term Goal 1 (Week 2): LTG=STG 2/2 ELOS   Skilled Therapeutic Interventions/Progress Updates:    Pt greeted at time of session sitting up in wheelchair with significant other present who remained for first part of session, did not stay for whole session. No pain reported from pt. Extended time spent at beginning of session with the pt and S.O. reviewing medical history and events leading to this hospitalization. S.O relaying that she has been through training with PT/OT and able to verbally recall techniques with good carryover. Also stating pt has all DME needed at home. Pt transported to ortho gym and performed SCIFIT seated on level 4 for 5 minutes as a warm up/preparatory activity. 2 rounds of seated BITS activities, using RUE only to hit targets 1-30 for numbers and second round for visual scanning and tracing, tracing 4 shapes with varying difficulty, again with R hand only. Remainder of session focused on [facility] activities copying pattern made out of small multicolored objects, having difficulty and needing Mod cues to problem solve. Back in room set up with alarm on call bell in reach.   Therapy Documentation Precautions:  Precautions Precautions: Fall Precaution Comments: Maintain BP <160; HOB >30  degrees Restrictions Weight Bearing Restrictions: No     Therapy/Group: Individual Therapy  Viona Gilmore 02/24/2021, 12:56 PM

## 2021-02-25 LAB — BASIC METABOLIC PANEL
Anion gap: 12 (ref 5–15)
BUN: 51 mg/dL — ABNORMAL HIGH (ref 8–23)
CO2: 25 mmol/L (ref 22–32)
Calcium: 8.7 mg/dL — ABNORMAL LOW (ref 8.9–10.3)
Chloride: 101 mmol/L (ref 98–111)
Creatinine, Ser: 2.41 mg/dL — ABNORMAL HIGH (ref 0.61–1.24)
GFR, Estimated: 26 mL/min — ABNORMAL LOW (ref >60)
Glucose, Bld: 341 mg/dL — ABNORMAL HIGH (ref 70–99)
Potassium: 3.9 mmol/L (ref 3.5–5.1)
Sodium: 138 mmol/L (ref 135–145)

## 2021-02-25 LAB — GLUCOSE, CAPILLARY
Glucose-Capillary: 97 mg/dL (ref 70–99)
Glucose-Capillary: 206 mg/dL — ABNORMAL HIGH (ref 70–99)
Glucose-Capillary: 164 mg/dL — ABNORMAL HIGH (ref 70–99)
Glucose-Capillary: 155 mg/dL — ABNORMAL HIGH (ref 70–99)

## 2021-02-25 MED ORDER — INSULIN GLARGINE-YFGN 100 UNIT/ML ~~LOC~~ SOLN
16.0000 [IU] | Freq: Every day | SUBCUTANEOUS | Status: DC
Start: 2021-02-26 — End: 2021-02-27
  Administered 2021-02-26 – 2021-02-27 (×2): 16 [IU] via SUBCUTANEOUS
  Filled 2021-02-25 (×2): qty 0.16

## 2021-02-25 NOTE — Progress Notes (Signed)
Occupational Therapy Discharge Summary  Patient Details  Name: Kyle Roberson MRN: [id number] Date of Birth: [birth year]    Patient has met 73 of 11 long term goals due to improved activity tolerance, improved balance, postural control, ability to compensate for deficits, functional use of  RIGHT upper and RIGHT lower extremity, improved attention, improved awareness, and improved coordination.  Patient to discharge at overall Supervision level.  Patient's care partner is independent to provide the necessary physical and cognitive assistance at discharge.    Reasons goals not met: Pt uses the RUE at a non-dominant level  Recommendation:  Patient will benefit from ongoing skilled OT services in outpatient setting to continue to advance functional skills in the area of BADL and functional use of R UE .  Equipment: No equipment provided  Reasons for discharge: treatment goals met and discharge from hospital  Patient/family agrees with progress made and goals achieved: Yes  OT Discharge Precautions/Restrictions  Precautions Precautions: Fall Precaution Comments: Maintain BP <160 Restrictions Weight Bearing Restrictions: No Pain  Denies pain ADL ADL Eating: Set up Grooming: Supervision/safety Where Assessed-Grooming: Sitting at sink Upper Body Bathing: Supervision/safety Where Assessed-Upper Body Bathing: Sitting at sink Lower Body Bathing: Supervision/safety Where Assessed-Lower Body Bathing: Standing at sink Upper Body Dressing: Supervision/safety Where Assessed-Upper Body Dressing: Sitting at sink Lower Body Dressing: Minimal assistance Where Assessed-Lower Body Dressing: Standing at sink Toileting: Supervision/safety Where Assessed-Toileting:  (Urinal) Toilet Transfer: Close supervision Toilet Transfer Method: Not assessed Gaffer Transfer: Close supervision ADL Comments: Supervision to set-up UB ADLs; Mod A LB ADLs and toileting Vision Additional Comments:  L eye blindness Perception  Perception: Within Functional Limits Cognition Overall Cognitive Status: Impaired/Different from baseline Arousal/Alertness: Awake/alert Orientation Level: Oriented X4 Year: 2022 Month: December Day of Week: Correct Memory: Impaired Memory Impairment: Decreased recall of new information;Decreased short term memory Immediate Memory Recall: Blue;Sock;Bed Memory Recall Sock: Without Cue Memory Recall Blue: Without Cue Memory Recall Bed: With Cue Awareness: Impaired Behaviors: Impulsive Safety/Judgment: Impaired Sensation Sensation Light Touch: Appears Intact Hot/Cold: Appears Intact Coordination Gross Motor Movements are Fluid and Coordinated: No Fine Motor Movements are Fluid and Coordinated: No Coordination and Movement Description: much improved coordination with R hand Motor  Motor Motor - Discharge Observations: Mild R hemiplegia Mobility  Transfers Sit to Stand: Supervision/Verbal cueing Stand to Sit: Supervision/Verbal cueing  Balance Balance Static Sitting Balance Static Sitting - Level of Assistance: 6: Modified independent (Device/Increase time) Dynamic Sitting Balance Dynamic Sitting - Balance Support: Feet supported Dynamic Sitting - Level of Assistance: 5: Stand by assistance Static Standing Balance Static Standing - Balance Support: During functional activity Static Standing - Level of Assistance: 5: Stand by assistance Dynamic Standing Balance Dynamic Standing - Balance Support: During functional activity Dynamic Standing - Level of Assistance: 5: Stand by assistance Extremity/Trunk Assessment RUE Assessment RUE Assessment: Exceptions to [facility] General Strength Comments: 4-/5 overll, much improved since eval RUE Body System: Neuro Brunstrum levels for arm and hand: Arm;Hand Brunstrum level for arm: Stage V Relative Independence from Synergy Brunstrum level for hand: Stage VI Isolated joint movements LUE Assessment LUE  Assessment: Within Functional Limits   Daneen Schick Doe 02/25/2021, 3:01 PM

## 2021-02-25 NOTE — Progress Notes (Signed)
PROGRESS NOTE   Subjective/Complaints: No new issues today. In reasonable spirits. No problems with therapy yesterday  ROS: Patient denies fever, rash, sore throat, blurred vision, nausea, vomiting, diarrhea, cough, shortness of breath or chest pain, joint or back pain, headache, or mood change.   Objective:   No results found. Recent Labs    02/24/21 0537  WBC 8.3  HGB 11.8*  HCT 37.1*  PLT 210    Recent Labs    02/24/21 0537  NA 136  K 3.5  CL 100  CO2 27  GLUCOSE 209*  BUN 53*  CREATININE 2.35*  CALCIUM 8.5*     Intake/Output Summary (Last 24 hours) at 02/25/2021 0859 Last data filed at 02/25/2021 0735 Gross per 24 hour  Intake 720 ml  Output 150 ml  Net 570 ml        Physical Exam: Vital Signs Blood pressure (!) 158/57, pulse (!) 52, temperature 97.7 F (36.5 C), temperature source Oral, resp. rate 16, height 5\' 8"  (1.727 m), weight 97.6 kg, SpO2 100 %. Constitutional: No distress . Vital signs reviewed. HEENT: NCAT, EOMI, oral membranes moist Neck: supple Cardiovascular: RRR without murmur. No JVD    Respiratory/Chest: CTA Bilaterally without wheezes or rales. Normal effort    GI/Abdomen: BS +, non-tender, non-distended Ext: no clubbing, cyanosis, or edema Psych: pleasant and cooperative  Skin: Warm and dry.  Incision CDI Musc: No edema in extremities.  No tenderness in extremities. Neuro: Alert, fair insight and awareness. Some word finding deficits.  Motor: RUE: 4/5 prox to distal. LUE 5-/5. RLE 4/5 prox to distal, LLE 4+ to 5-/5, stable  Assessment/Plan: 1. Functional deficits which require 3+ hours per day of interdisciplinary therapy in a comprehensive inpatient rehab setting. Physiatrist is providing close team supervision and 24 hour management of active medical problems listed below. Physiatrist and rehab team continue to assess barriers to discharge/monitor patient progress toward  functional and medical goals  Care Tool:  Bathing    Body parts bathed by patient: Right arm, Left arm, Chest, Abdomen (UB only this am)   Body parts bathed by helper: Left arm, Buttocks, Right lower leg, Left lower leg     Bathing assist Assist Level: Supervision/Verbal cueing     Upper Body Dressing/Undressing Upper body dressing   What is the patient wearing?: Pull over shirt    Upper body assist Assist Level: Minimal Assistance - Patient > 75%    Lower Body Dressing/Undressing Lower body dressing      What is the patient wearing?: Underwear/pull up, Pants     Lower body assist Assist for lower body dressing: Moderate Assistance - Patient 50 - 74%     Toileting Toileting    Toileting assist Assist for toileting: Contact Guard/Touching assist     Transfers Chair/bed transfer  Transfers assist     Chair/bed transfer assist level: Minimal Assistance - Patient > 75%     Locomotion Ambulation   Ambulation assist      Assist level: Supervision/Verbal cueing Assistive device: No Device Max distance: 150   Walk 10 feet activity   Assist     Assist level: Supervision/Verbal cueing Assistive device: No Device  Walk 50 feet activity   Assist    Assist level: Supervision/Verbal cueing Assistive device: No Device    Walk 150 feet activity   Assist    Assist level: Supervision/Verbal cueing Assistive device: No Device    Walk 10 feet on uneven surface  activity   Assist     Assist level: Minimal Assistance - Patient > 75%     Wheelchair     Assist Is the patient using a wheelchair?: No             Wheelchair 50 feet with 2 turns activity    Assist            Wheelchair 150 feet activity     Assist          Blood pressure (!) 158/57, pulse (!) 52, temperature 97.7 F (36.5 C), temperature source Oral, resp. rate 16, height 5\' 8"  (1.727 m), weight 97.6 kg, SpO2 100 %.  Medical Problem List and  Plan: 1.  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  Status post craniotomy evacuation 02/05/2021 -Continue CIR therapies including PT, OT, and SLP. Interdisciplinary team conference today to discuss goals, barriers to discharge, and dc planning.    2.  Antithrombotics: -DVT/anticoagulation:  Pharmaceutical: Other (comment) Eliquis             -antiplatelet therapy: N/A 3. Pain: continue Fioricet, oxycodone  Controlled with meds on 12/4 4. Mood: Provide emotional support  -monitor interactions with staff             -antipsychotic agents: N/A 5. Neuropsych: This patient is capable of making decisions on his own behalf. 6. Skin/Wound Care: staples out of head 7. Fluids/Electrolytes/Nutrition: Routine in and out  -low albumin---dc supp as pt refusing 8.  Diabetes mellitus peripheral neuropathy.  Hemoglobin A1c 7.3.  NovoLog 3 units 4 times daily with meals  CBG (last 3)  Recent Labs    02/24/21 1706 02/24/21 2140 02/25/21 0606  GLUCAP 158* 210* 97  Increased semglee to 13u daily on 12/2 12/6, increase semglee to 16u 12/7 (did not adjust yest) 9.  CKD stage IV.  Continue torsemide for now, Zaroxolyn on hold.  -Cr 2.0 on 11/27-->2.35 today, holding torsemide d/t AKI   -recheck today 12/6 10.  Obesity.  BMI 34.97.  Dietary follow-up 11.  Chronic atrial fibrillation.  Cardiac rate controlled.  Eliquis resumed 02/11/2021 12.  Hypertension.  Imdur 30 mg daily, Demadex 60 mg daily   Vitals:   02/25/21 0401 02/25/21 0404  BP: (!) 168/58 (!) 158/57  Pulse: (!) 53 (!) 52  Resp: 16   Temp: 97.7 F (36.5 C)   SpO2: 97% 100%  Also on Hydralazine 100mg  TID  Adjusted imdur to 90mg  starting 11/30    Fair control on 12/6, demadex on hold for #9 13.  Chronic diastolic congestive heart failure.  Continue Demadex 60 mg daily.  Monitor for any signs of fluid overload  - oxygen at HS scheduled Filed Weights   02/11/21 1549 02/21/21 1034 02/24/21  0250  Weight: 103.4 kg 99.5 kg 97.6 kg    -weights down (based on 2 readings)  -hold torsemide (see above)  -need weight today 14.  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  Continue eyedrops. 15.  CAD with history of stenting.  Continue Imdur.  No chest pain or shortness of breath  ?right carotid artery dissection from trauma?-  + flow on MRA earlier this month. No  other carotid studies in system   16. Constipation: added colace 100mg  daily.  17. Bradycardia: continue to monitor, will not decrease medications given elevated SBP 18. OSA: schedule oxygen, outpt sleep study    LOS: 14 days A FACE TO FACE EVALUATION WAS PERFORMED  Meredith Staggers 02/25/2021, 8:59 AM

## 2021-02-25 NOTE — Progress Notes (Signed)
Patient ID: Kyle Roberson, male   DOB: 02/09/1941, 80 y.o.   MRN: [id number]  SW met with pt in room to provide updates from team conference, and d/c date remains 12/8. SW dicussed with pt if he is open to ongoing therapies. Pt does not want HH as he does not want people in his home, and does not want outpatient therapies as he is concerned about COVID. Pt is aware SW will follow-up with his partner [name].  [phone] SW called pt partner [name] to provide updates but no answer and voicemail full. SW will continue to make efforts to discuss d/c recs.   Loralee Pacas, MSW, [place] Office: [phone] Cell: [phone] Fax: [phone]

## 2021-02-25 NOTE — Progress Notes (Signed)
Occupational Therapy TBI Note  Patient Details  Name: Kyle Roberson MRN: [id number] Date of Birth: Dec 09, 1940  Today's Date: 02/25/2021 Session 1 OT Individual Time: 7858-8502 OT Individual Time Calculation (min): 58 min   Session 2 OT Individual Time: 1349-1500 OT Individual Time Calculation (min): 71 min    Short Term Goals: Week 2:  OT Short Term Goal 1 (Week 2): LTG=STG 2/2 ELOS  Skilled Therapeutic Interventions/Progress Updates:  Session 1  Pt greeted seated EOB and agreeable to OT treatment session. Pt needed OT assist to don TED hose with education provided on friction reducing bag technique. Pt able to thread pant legs and stand to pull them up with close supervision. Pt ambulated into bathroom without AD and close supervision. Pt voided bladder and completed peri-care with close supervision. Grooming tasks completed in standing at the sink with focus on using R hand to brush teeth and open containers. Pt brought to therapy gym and was issued handout on theraputty exercises. Pt completed therpautty exercises as directed on handout with cues for technique. Pt returned to room and left seated in wc with nursing present to get standing weight. Pt left in nurses care.   Agitated Behavior Scale: TBI Observation Details Observation Environment: Room Start of observation period - Date: 02/25/21 Start of observation period - Time: 0845 End of observation period - Date: 02/25/21 End of observation period - Time: 0945 Agitated Behavior Scale (DO NOT LEAVE BLANKS) Short attention span, easy distractibility, inability to concentrate: Absent Impulsive, impatient, low tolerance for pain or frustration: Absent Uncooperative, resistant to care, demanding: Absent Violent and/or threatening violence toward people or property: Absent Explosive and/or unpredictable anger: Absent Rocking, rubbing, moaning, or other self-stimulating behavior: Absent Pulling at tubes, restraints, etc.:  Absent Wandering from treatment areas: Absent Restlessness, pacing, excessive movement: Absent Repetitive behaviors, motor, and/or verbal: Absent Rapid, loud, or excessive talking: Absent Sudden changes of mood: Absent Easily initiated or excessive crying and/or laughter: Absent Self-abusiveness, physical and/or verbal: Absent Agitated behavior scale total score: 14  Session 2 Pt greeted seated in recliner and agreeable to OT treatment session. Pt brought to therapy gym in wc. OT issued Home Fine Motor Program worksheet and reviewed therapeutic activtities. Worked on handwriting with pt able to grasp pen using tripod. Pt handwritting much improved. Focus on printing letters going from large to small. OT educated on LB dressing using ADL AE. Pt stated sock-aid had not worked in the past, but willing to try bariatric sock-aid. OT demonstrated technique first, then he was able to demonstrate understanding with successful donning of sock. OT educated on use of reacher for increased access to lower body, but pt reported he can don his brief on his own. But OT still educated on tecnique if he had any difficulty at dc. OT showed pt LH sponge as well and educated on uses. Pt returned to room and left seated in wc with alarm belt on and needs met.   Agitated Behavior Scale: TBI Observation Details Observation Environment: Room Start of observation period - Date: 02/25/21 Start of observation period - Time: 1349 End of observation period - Date: 02/25/21 End of observation period - Time: 0945 Agitated Behavior Scale (DO NOT LEAVE BLANKS) Short attention span, easy distractibility, inability to concentrate: Absent Impulsive, impatient, low tolerance for pain or frustration: Absent Uncooperative, resistant to care, demanding: Absent Violent and/or threatening violence toward people or property: Absent Explosive and/or unpredictable anger: Absent Rocking, rubbing, moaning, or other self-stimulating  behavior: Absent Pulling at tubes,  restraints, etc.: Absent Wandering from treatment areas: Absent Restlessness, pacing, excessive movement: Absent Repetitive behaviors, motor, and/or verbal: Absent Rapid, loud, or excessive talking: Absent Sudden changes of mood: Absent Easily initiated or excessive crying and/or laughter: Absent Self-abusiveness, physical and/or verbal: Absent Agitated behavior scale total score: 14  Therapy Documentation Precautions:  Precautions Precautions: Fall Precaution Comments: Maintain BP <160 Restrictions Weight Bearing Restrictions: No Pain:  Denies pain   Therapy/Group: Individual Therapy  Valma Cava 02/25/2021, 3:00 PM

## 2021-02-25 NOTE — Patient Care Conference (Signed)
Inpatient RehabilitationTeam Conference and Plan of Care Update Date: 02/25/2021   Time: 10:39 AM    Patient Name: Kyle Roberson      Medical Record Number: [id number]  Date of Birth: 1940-05-22 Sex: Male         Room/Bed: 4W21C/4W21C-01 Payor Info: Payor: [employer] MEDICARE / Plan: [employer] MEDICARE HMO/PPO / Product Type: *No Product type* /    Admit Date/Time:  02/11/2021  2:01 PM  Primary Diagnosis:  Traumatic subdural hematoma  Hospital Problems: Principal Problem:   Traumatic subdural hematoma Active Problems:   Benign essential HTN   Diabetic peripheral neuropathy ([place])   Chronic kidney disease (CKD), stage IV (severe) [facility])    Expected Discharge Date: Expected Discharge Date: 02/27/21  Team Members Present: Physician leading conference: Dr. Alger Simons Social Worker Present: Loralee Pacas, [place] Nurse Present: Dorthula Nettles, RN PT Present: Tereasa Coop, PT OT Present: Cherylynn Ridges, OT SLP Present: Weston Anna, SLP PPS Coordinator present : Gunnar Fusi, SLP     Current Status/Progress Goal Weekly Team Focus  Bowel/Bladder   Continent of B/B. LBM 02/24/21  Maintain Continence      Swallow/Nutrition/ Hydration             ADL's   Min A/supervision  Supervision overall  R fine motor control, fmaily ed, dc planning   Mobility   CGA-supervision overall without AD  Supervision overall  balance, activity tolerance, gait training, HEP, d/c assessment, patient/caregiver education   Communication   Supervision-Mod I  Mod I  Family Education   Safety/Cognition/ Behavioral Observations  [place] Level VIII-Supervision  Supervision  Family Education   Pain   Denies Pain  Remain pain free  Assess q shift and prn   Skin   Skin Intact  Prevent further breakdown.  Assess Q shift and prn.     Discharge Planning:  Pt to d/c to home with his partner who will provide support pt requires. Will hire assistance if needed. Fam edu completed on 12/5 9am-2pm.   Team  Discussion: Decreasing diuretics. CBG's have been up/down. Family education completed yesterday. Medically ready for discharge. Speech improved, cognition appears to be at baseline. No current nursing issues. Patient on target to meet rehab goals: yes, supervision goals. Min assist for lower body ADL's.  *See Care Plan and progress notes for long and short-term goals.   Revisions to Treatment Plan:  Adjusting medications  Teaching Needs: Family education, medication management, skin/wound care, safety awareness, transfer/gait training, etc.  Current Barriers to Discharge: Decreased caregiver support, Home enviroment access/layout, Wound care, Lack of/limited family support, and Medication compliance  Possible Resolutions to Barriers: Family education Order DME Follow up PT/OT     Medical Summary Current Status: improving sleep and cognition. a little dry from diuretics, making adjustments to meds, pushing fluids, cbg's a little labile  Barriers to Discharge: Medical stability   Possible Resolutions to Barriers/Weekly Focus: maximize volume/rena status, adjusting meds/cv regimen   Continued Need for Acute Rehabilitation Level of Care: The patient requires daily medical management by a physician with specialized training in physical medicine and rehabilitation for the following reasons: Direction of a multidisciplinary physical rehabilitation program to maximize functional independence : Yes Medical management of patient stability for increased activity during participation in an intensive rehabilitation regime.: Yes Analysis of laboratory values and/or radiology reports with any subsequent need for medication adjustment and/or medical intervention. : Yes   I attest that I was present, lead the team conference, and concur with the assessment and plan  of the team.   Cristi Loron 02/25/2021, 3:29 PM

## 2021-02-25 NOTE — Discharge Summary (Signed)
Physician Discharge Summary  Patient ID: Kyle Roberson MRN: [id number] DOB/AGE: Jul 25, 1940 80 y.o.  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 EXAM: CT HEAD WITHOUT CONTRAST TECHNIQUE: Contiguous axial images were obtained from the base of the skull through the vertex without intravenous contrast. COMPARISON:  Head CT 02/05/2021 FINDINGS: Brain: Sequelae of left-sided craniotomy for subdural hematoma evacuation are again identified. Residual mixed density collection over the left cerebral convexity has an unchanged maximal thickness of 1.3 cm. A small amount of gas within the collection has decreased. A small amount of low-density extra-axial fluid immediately subjacent to the craniotomy flap measures up to 7 mm in thickness and has increased. Mild mass effect on the left cerebral hemisphere is unchanged including trace rightward midline shift. No new intracranial hemorrhage, acute infarct, or mass is identified. There is mild cerebral atrophy. Hypodensities in the cerebral white matter bilaterally are unchanged and nonspecific but compatible with mild chronic small vessel ischemic disease. Vascular: Calcified atherosclerosis at the skull base. No hyperdense vessel. Skull: Left frontoparietal craniotomy with skin staples remaining in place. Interval removal of the left scalp drain. Sinuses/Orbits: Unremarkable included orbits.  Visualized paranasal sinuses and mastoid air cells are clear. Other: None. IMPRESSION: 1. Residual mixed density left-sided subdural hematoma with minimal interval changes as above. Unchanged mass effect and trace rightward midline shift. 2. No evidence of new intracranial hemorrhage. 3. Mild chronic small vessel ischemic disease. Electronically Signed   By: Logan Bores M.D.   On: 02/10/2021 12:45   CT HEAD WO CONTRAST  Result Date: 02/05/2021 CLINICAL DATA:  Follow-up left subdural hematoma. EXAM: CT HEAD WITHOUT CONTRAST TECHNIQUE: Contiguous axial images were obtained from the base of the skull through the vertex without intravenous contrast. COMPARISON:  MRI brain 02/03/2021, head CT 02/02/2021 FINDINGS: Brain: Again noted is a mixed density left cerebral convexity subdural hematoma, decreased in size with a maximum thickness of 1.4 cm, previously 1.8 cm, with less underlying crowding of the left frontoparietal cerebral gyri. There are small scattered air pockets in the collection. No new hemorrhage is seen. Midline shift is improved was previously 5 mm now 3 mm, left to right at about the roof of the third ventricle. There is no appreciable new or acute bleed. There is moderately advanced cerebral atrophy and small vessel disease. Small chronic perivascular space posterior left basal ganglia. There is partial cerebellar atrophy. No acute infarct is seen. Vascular: The carotid siphons are heavily calcified. There are no hyperdense central vessels. Skull: There is interval new demonstration of a lateral left parietal high convexity craniotomy with overlying skin staples. There is an underlying surgical drain in the scalp but the drain does not enter the subdural space. Otherwise intact calvarium. Sinuses/Orbits: No acute finding. Other: None. IMPRESSION: 1. Interval left-sided craniotomy with surgical drain in the scalp and underlying reduction in the left cerebral convexity mixed-density subdural hematoma.  2. Scattered air pockets in the collection with decreased left-to-right midline shift now 3 mm. 3. There is decreased mass effect on the left cerebral  hemisphere with decreased gyral crowding. There is no downward mass effect. 4. Atrophy and small vessel changes. Electronically Signed   By: Telford Nab M.D.   On: 02/05/2021 06:03   CT HEAD WO CONTRAST (5MM)  Result Date: 02/02/2021 CLINICAL DATA:  Follow-up examination for mental status change, known head bleed. EXAM: CT HEAD WITHOUT CONTRAST TECHNIQUE: Contiguous axial images were obtained from the base of the skull through the vertex without intravenous contrast. COMPARISON:  Prior head CT from 01/29/2021. FINDINGS: Brain: Mixed density extra-axial hemorrhage overlying the left cerebral convexity is not significantly changed measuring up to 1.7 cm in maximal thickness. Associated mass effect with 5 mm of left-to-right shift, stable. No evidence for new or interval bleeding. Underlying atrophy with chronic small vessel ischemic disease again noted. No other acute intracranial hemorrhage. No visible acute large vessel territory infarct. No mass lesion or hydrocephalus. Basilar cisterns remain patent. Vascular: No hyperdense vessel. Scattered vascular calcifications noted within the carotid siphons. Skull: Scalp soft tissues and calvarium demonstrate no new abnormality. Sinuses/Orbits: Globes and orbital soft tissues demonstrate no acute finding. Visualized paranasal sinuses and mastoid air cells are clear. Other: None. IMPRESSION: 1. No significant interval change in size and appearance of mixed density extra-axial hemorrhage overlying the left cerebral convexity with associated mass effect and 5 mm of left-to-right shift. No evidence for new or interval bleeding. 2. No other new acute intracranial abnormality. 3. Underlying atrophy with chronic small vessel ischemic disease. Electronically Signed   By: Jeannine Boga M.D.   On: 02/02/2021 22:40   CT  Head Wo Contrast  Result Date: 01/29/2021 CLINICAL DATA:  Follow-up examination for intracranial hemorrhage. EXAM: CT HEAD WITHOUT CONTRAST TECHNIQUE: Contiguous axial images were obtained from the base of the skull through the vertex without intravenous contrast. COMPARISON:  Prior CT from 01/28/2021. FINDINGS: Brain: Previously identified mixed density extra-axial hemorrhage overlying the left cerebral convexity again seen, stable in size and appearance measuring up to approximately 1.7 cm in diameter. Degree of hyperdense blood products within this collection is stable without evidence for interval bleeding. Similar mass effect on the subjacent left cerebral hemisphere with mild 5 mm left-to-right shift, stable. No new intracranial hemorrhage or large vessel territory infarct. No hydrocephalus or ventricular trapping. Basilar cisterns remain patent. No mass lesion. Underlying atrophy with chronic small vessel ischemic disease again noted. Vascular: No hyperdense vessel. Scattered vascular calcifications noted within the carotid siphons. Skull: Scalp soft tissues and calvarium demonstrate no new abnormality. Sinuses/Orbits: Globes and orbital soft tissues demonstrate no acute finding. Paranasal sinuses and mastoid air cells remain clear. Other: None. IMPRESSION: 1. No significant interval change in size and appearance of mixed density extra-axial hemorrhage overlying the left cerebral convexity. No evidence for interval bleeding. Similar mass effect with associated 5 mm left-to-right shift, stable. 2. No other new acute intracranial abnormality. Electronically Signed   By: Jeannine Boga M.D.   On: 01/29/2021 04:14   CT HEAD WO CONTRAST  Result Date: 01/28/2021 CLINICAL DATA:  Initial evaluation for neuro deficit, stroke suspected, dizziness. EXAM: CT HEAD WITHOUT CONTRAST TECHNIQUE: Contiguous axial images were obtained from the base of the skull through the vertex without intravenous contrast.  COMPARISON:  CT from 11/15/2020 FINDINGS: Brain: Age-related cerebral atrophy with mild chronic small vessel ischemic disease. There is a mixed density extra-axial collection overlying the left cerebral convexity, likely subdural in location. Collection is largely isointense, although some hyperdense material is present as well. Findings consistent with an acute on subacute subdural hematoma.  This measures up to 1.7 cm in maximal diameter at the left frontal convexity. Associated mild mass effect on the subjacent left cerebral hemisphere with up to 5 mm of left-to-right shift. No hydrocephalus or trapping. Basilar cisterns remain patent. No other acute intracranial hemorrhage. No acute large vessel territory infarct. No mass lesion. Vascular: No hyperdense vessel. Scattered vascular calcifications noted within the carotid siphons. Skull: No visible scalp soft tissue abnormality.  Calvarium intact. Sinuses/Orbits: Globes and orbital soft tissues demonstrate no acute finding. Paranasal sinuses and mastoid air cells are largely clear. Other: None. IMPRESSION: 1. Acute on subacute left subdural hematoma measuring up to 1.7 cm in maximal diameter at the left frontal convexity. Associated mild mass effect on the subjacent left cerebral hemisphere with up to 5 mm of left-to-right shift. No hydrocephalus or trapping. 2. No other acute intracranial abnormality. 3. 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 Critical Value/emergent results were called by telephone at the time of interpretation on 01/28/2021 at 9:23 pm to provider Dr. Cinda Quest, who verbally acknowledged these results. Electronically Signed   By: Jeannine Boga M.D.   On: 01/28/2021 21:30   MR ANGIO HEAD WO CONTRAST  Result Date: 02/03/2021 CLINICAL DATA:  Acute stroke suspected.  Expressive aphasia. EXAM: MRI HEAD WITHOUT CONTRAST MRA HEAD WITHOUT CONTRAST MRA NECK WITHOUT CONTRAST TECHNIQUE: Multiplanar, multiecho  pulse sequences of the brain and surrounding structures were obtained without intravenous contrast. Angiographic images of the Circle of Willis were obtained using MRA technique without intravenous contrast. Angiographic images of the neck were obtained using MRA technique without intravenous contrast. Carotid stenosis measurements (when applicable) are obtained utilizing NASCET criteria, using the distal internal carotid diameter as the denominator. COMPARISON:  Head CT from yesterday FINDINGS: MRI HEAD FINDINGS Brain: Nonacute subdural hematoma along the left cerebral convexity, known from prior CT. Maximal thickness is 17 mm with cortical mass effect. Mild local adjacent subarachnoid blood. Midline shift measures 5 mm No acute infarct.  No mass or hydrocephalus. Vascular: Normal flow voids. Skull and upper cervical spine: Normal marrow signal. Sinuses/Orbits: Negative. MRA HEAD FINDINGS Widening with flap in the right ICA at the level of the skull base entrance. Major vessel mild irregularity which is likely atherosclerosis and motion artifact. No major branch occlusion, generalized beading, or aneurysm. The vertebral and basilar arteries are small in the setting of fetal type PCA circulation. MRA NECK FINDINGS Postcontrast MRA was attempted but due to IV catheter failure postcontrast imaging was not acquired at a diagnostic level. Limited coverage time-of-flight imaging shows patent carotid bifurcations and antegrade robust flow in the vertebral arteries with mild atheromatous luminal undulation. IMPRESSION: Brain MRI: 1. Known nonacute subdural hematoma on the left with cortical mass effect and 5 mm of midline shift. 2. No acute infarct. Intracranial MRA: 1. No emergent finding or proximal flow limiting stenosis. 2. 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 Neck MRA: Attempted contrasted MRA but the only diagnostic images are without contrast due to  IV failure. Antegrade robust flow in the carotid and vertebral arteries where covered. Electronically Signed   By: Jorje Guild M.D.   On: 02/03/2021 05:17   MR ANGIO NECK WO CONTRAST  Result Date: 02/03/2021 CLINICAL DATA:  Acute stroke suspected.  Expressive aphasia. EXAM: MRI HEAD WITHOUT CONTRAST MRA HEAD WITHOUT CONTRAST MRA NECK WITHOUT CONTRAST TECHNIQUE: Multiplanar, multiecho pulse sequences of the brain and surrounding structures were obtained without intravenous contrast. Angiographic images  of the Circle of Willis were obtained using MRA technique without intravenous contrast. Angiographic images of the neck were obtained using MRA technique without intravenous contrast. Carotid stenosis measurements (when applicable) are obtained utilizing NASCET criteria, using the distal internal carotid diameter as the denominator. COMPARISON:  Head CT from yesterday FINDINGS: MRI HEAD FINDINGS Brain: Nonacute subdural hematoma along the left cerebral convexity, known from prior CT. Maximal thickness is 17 mm with cortical mass effect. Mild local adjacent subarachnoid blood. Midline shift measures 5 mm No acute infarct.  No mass or hydrocephalus. Vascular: Normal flow voids. Skull and upper cervical spine: Normal marrow signal. Sinuses/Orbits: Negative. MRA HEAD FINDINGS Widening with flap in the right ICA at the level of the skull base entrance. Major vessel mild irregularity which is likely atherosclerosis and motion artifact. No major branch occlusion, generalized beading, or aneurysm. The vertebral and basilar arteries are small in the setting of fetal type PCA circulation. MRA NECK FINDINGS Postcontrast MRA was attempted but due to IV catheter failure postcontrast imaging was not acquired at a diagnostic level. Limited coverage time-of-flight imaging shows patent carotid bifurcations and antegrade robust flow in the vertebral arteries with mild atheromatous luminal undulation. IMPRESSION: Brain MRI: 1.  Known nonacute subdural hematoma on the left with cortical mass effect and 5 mm of midline shift. 2. No acute infarct. Intracranial MRA: 1. No emergent finding or proximal flow limiting stenosis. 2. 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 Neck MRA: Attempted contrasted MRA but the only diagnostic images are without contrast due to IV failure. Antegrade robust flow in the carotid and vertebral arteries where covered. Electronically Signed   By: Jorje Guild M.D.   On: 02/03/2021 05:17   MR BRAIN WO CONTRAST  Result Date: 02/03/2021 CLINICAL DATA:  Acute stroke suspected.  Expressive aphasia. EXAM: MRI HEAD WITHOUT CONTRAST MRA HEAD WITHOUT CONTRAST MRA NECK WITHOUT CONTRAST TECHNIQUE: Multiplanar, multiecho pulse sequences of the brain and surrounding structures were obtained without intravenous contrast. Angiographic images of the Circle of Willis were obtained using MRA technique without intravenous contrast. Angiographic images of the neck were obtained using MRA technique without intravenous contrast. Carotid stenosis measurements (when applicable) are obtained utilizing NASCET criteria, using the distal internal carotid diameter as the denominator. COMPARISON:  Head CT from yesterday FINDINGS: MRI HEAD FINDINGS Brain: Nonacute subdural hematoma along the left cerebral convexity, known from prior CT. Maximal thickness is 17 mm with cortical mass effect. Mild local adjacent subarachnoid blood. Midline shift measures 5 mm No acute infarct.  No mass or hydrocephalus. Vascular: Normal flow voids. Skull and upper cervical spine: Normal marrow signal. Sinuses/Orbits: Negative. MRA HEAD FINDINGS Widening with flap in the right ICA at the level of the skull base entrance. Major vessel mild irregularity which is likely atherosclerosis and motion artifact. No major branch occlusion, generalized beading, or aneurysm. The vertebral and basilar arteries  are small in the setting of fetal type PCA circulation. MRA NECK FINDINGS Postcontrast MRA was attempted but due to IV catheter failure postcontrast imaging was not acquired at a diagnostic level. Limited coverage time-of-flight imaging shows patent carotid bifurcations and antegrade robust flow in the vertebral arteries with mild atheromatous luminal undulation. IMPRESSION: Brain MRI: 1. Known nonacute subdural hematoma on the left with cortical mass effect and 5 mm of midline shift. 2. No acute infarct. Intracranial MRA: 1. No emergent finding or proximal flow limiting stenosis. 2. Partially covered dissection of the right ICA at  the skull base, usually posttraumatic in this location. Neck MRA: Attempted contrasted MRA but the only diagnostic images are without contrast due to IV failure. Antegrade robust flow in the carotid and vertebral arteries where covered. Electronically Signed   By: Jorje Guild M.D.   On: 02/03/2021 05:17    Labs:  Basic Metabolic Panel: Recent Labs  Lab 02/24/21 0537 02/25/21 0946  NA 136 138  K 3.5 3.9  CL 100 101  CO2 27 25  GLUCOSE 209* 341*  BUN 53* 51*  CREATININE 2.35* 2.41*  CALCIUM 8.5* 8.7*    CBC: Recent Labs  Lab 02/24/21 0537  WBC 8.3  HGB 11.8*  HCT 37.1*  MCV 92.1  PLT 210    CBG: Recent Labs  Lab 02/25/21 1654 02/25/21 2121 02/26/21 1152 02/26/21 1653 02/26/21 2224  GLUCAP 164* 155* 176* 111* 216*   Family history.  Mother with pancreatic cancer Father with CAD Brother with diabetes.  Denies any colon cancer esophageal cancer or rectal cancer  Brief HPI:   Kunaal Walkins is a 80 y.o. right-handed male with history of diabetes mellitus hypertension CKD stage IV hyperlipidemia CAD with stenting maintained on Eliquis chronic diastolic congestive heart failure tobacco use 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  Per chart review lives with significant other.  Modified independent with a quad cane.  Presented to  [facility] 02/04/2021 with bouts of dizziness and difficulty with hand coordination over a 1 day.  As well as expressive aphasia with increasing right side weakness.  Patient does endorse a fall August 2022 cranial CT scan at that time showed no acute changes.  On latest admission cranial CT scan 01/28/2021 showed chronic left-sided subdural hematoma left to right midline shift follow-up neurosurgery discharged to home and CT follow-up showed no significant interval change in size and appearance of mixed density extra-axial hemorrhage.  No other new intracranial abnormality.  Admission chemistries unremarkable aside from glucose 261 BUN 39 creatinine 2.04 hemoglobin A1c 7.3.  MRI 02/03/2021 showed nonacute subdural hematoma on the left with cortical mass-effect and 5 mm midline shift.  MRA no emergent finding of proximal flow-limiting stenosis.  Follow-up neurosurgery due to patient's progressive change underwent left craniotomy evacuation of subdural hematoma 01/26/2021 per Dr. Lacinda Axon.  Latest follow-up CT scan showed no evidence of new intracranial hemorrhage.  Patient was cleared to resume chronic Eliquis 02/11/2021.  Therapy evaluations completed due to patient's right side weakness and aphasia was admitted for a comprehensive rehab program.   Hospital Course: Malcolm Quast was admitted to rehab 02/11/2021 for inpatient therapies to consist of PT, ST and OT at least three hours five days a week. Past admission physiatrist, therapy team and rehab RN have worked together to provide customized collaborative inpatient rehab.  Pertaining to patient's traumatic SDH status postcraniotomy evacuation 02/05/2021 patient would follow-up neurosurgery Dr. Lacinda Axon.  Surgical site healing nicely.  Pain managed with use of Fioricet as well as oxycodone as needed.  Noted history of chronic atrial fibrillation he had been cleared to resume chronic Eliquis no bleeding episodes and monitored.  Cardiac rate remained controlled.  Blood  sugars monitored hemoglobin A1c 7.3 full diabetic teaching completed insulin therapy as directed.  CKD stage IV with Demadex held latest creatinine 2.10 Demadex resumed at 40 mg x 2 more days and then resume and would need outpatient follow-up with PCP within 3 to 5 days and his Zaroxolyn also remained on hold.  His blood pressure is currently controlled and managed with  Imdur 90 mg daily as well as hydralazine.  He exhibited no signs of fluid overload.  Noted history of CAD with stenting continued on Imdur no chest pain or shortness of breath.  Noted obesity BMI 34.97 dietary follow-up.   Blood pressures were monitored on TID basis and controlled  Diabetes has been monitored with ac/hs CBG checks and SSI was use prn for tighter BS control.    Rehab course: During patient's stay in rehab weekly team conferences were held to monitor patient's progress, set goals and discuss barriers to discharge. At admission, patient required minimal guard 120 feet rolling walker minimal assist sit to stand  Physical exam.  Blood pressure 162/58 pulse 42 temperature 97.5 respiration 23 oxygen saturation 93% room air Constitutional.  No acute distress HEENT.  Craniotomy site clean and dry Eyes.  Pupils round and reactive to light no discharge without nystagmus Neck.  Supple nontender no JVD without thyromegaly Cardiac regular rate rhythm any extra sounds or murmur heard Abdomen.  Soft nontender positive bowel sounds without rebound Respiratory effort normal no respiratory distress without wheeze Musculoskeletal.  Normal range of motion Comments.  Right upper extremity 4 -/5, left upper extremity 5 -/5 Right lower extremity 4/5 in hip flexors knee extension dorsi plantarflexion Left lower extremity 5 -/5 Skin.  Warm and dry Neurologic.  Oriented to person place and time.  Provides name with some delay in processing.  He/She  has had improvement in activity tolerance, balance, postural control as well as  ability to compensate for deficits. He/She has had improvement in functional use RUE/LUE  and RLE/LLE as well as improvement in awareness.  Ambulates 200 feet x 2 without assistive device close supervision ambulating to the bathroom with significant other contact-guard.  Completed bed mobility head of bed elevated supervision.  Contact-guard assist to thread pants and close supervision stand pivot to wheelchair.  Full family teaching completed plan discharged to home       Disposition: Discharged to home   Diet: Diabetic diet  Special Instructions: No driving smoking or alcohol  Follow up with PCP within 3-5 days for follow up chemistries  Medications at discharge 1.  Tylenol as needed 2.  Eliquis 2.5 mg p.o. twice daily 3.  Fioricet 1 tablet every 4 hours as needed headache 4.  Colace 100 mg p.o. daily 5.  Cosopt ophthalmic solution 1 drop left eye twice daily 6.  Hydralazine 100 mg p.o. 3 times daily 7.  Insulin Humalog 4 units 3 times daily with meals 8.  Lantus 16 units daily 9.  Imdur 90 mg p.o. daily 10.  Oxycodone 5 mg every 6 hours as needed pain 11.  Crestor 5 mg once per day Tuesday Thursday Saturday 12.  Senokot 1 tablet p.o. twice daily 13.Demadex 40 mg daily beginning 03/01/2021 14.  K. Dur 20 mEq daily as needed 15.  Multivitamin daily 16.  Glucotrol 10 mg daily   30-35 minutes were spent completing discharge summary and discharge planning  Discharge Instructions     Ambulatory referral to Physical Medicine Rehab   Complete by: As directed    Moderate complexity follow-up 1 to 2 weeks traumatic SDH        Follow-up Information     Meredith Staggers, MD Follow up.   Specialty: Physical Medicine and Rehabilitation Why: Office to call for appointment Contact information: [address] [place] [address] [phone]         Deetta Perla, MD Follow up.  Specialty: Neurosurgery Why: Call for appointment Contact information: [place] [address] [phone]         Minna Merritts, MD Follow up.   Specialty: Cardiology Why: Call for appointment Contact information: [place] [address] [phone]                 Signed: Cathlyn Parsons 02/27/2021, 5:37 AM

## 2021-02-25 NOTE — Progress Notes (Signed)
Physical Therapy TBI Note  Patient Details  Name: Kyle Roberson MRN: [id number] Date of Birth: 1940-06-24  Today's Date: 02/25/2021 PT Individual Time: 1100-1200 PT Individual Time Calculation (min): 60 min   Short Term Goals: Week 2:  PT Short Term Goal 1 (Week 2): STGs = LTGs  Skilled Therapeutic Interventions/Progress Updates:     Patient in w/c upon PT arrival. Patient alert and agreeable to PT session. Patient denied pain during session.  Therapeutic Activity: Transfers: Patient performed sit to/from stand x8 independently without AD.   Gait Training:  Patient ambulated >100 feet x2 without an AD with close supervision. Ambulated with decreased gait speed, decreased step length and height, R trendelenburg with lateral hip instability, mild forward trunk lean, and decreased visual scanning. Provided verbal cues for increased heel strike at initial contact for increased step height and reduced anterior bias for improved balance and increased arm swing for improved gait speed and balance. 6 Min Walk Test:  Instructed patient to ambulate as quickly and as safely as possible for 6 minutes using LRAD. Patient was allowed to take standing rest breaks without stopping the test, but if the patient required a sitting rest break the clock would be stopped and the test would be over.  Results: 690 feet (210 meters, Avg speed 0.6 m/s) without an AD. Vitals: BP 162/56, HR 47, SPO2 100%, RPE 6/10. Results indicate that the patient has reduced endurance with ambulation compared to age matched norms (417).   Neuromuscular Re-ed: Patient performed the following outcome measures: Patient demonstrates increased fall risk as noted by score of 47/56 on Berg Balance Scale.  (<36= high risk for falls, close to 100%; 37-45 significant >80%; 46-51 moderate >50%; 52-55 lower >25%) Five times Sit to Stand Test (FTSS) Method: Use a straight back chair with a solid seat that is 16-18" high. Ask participant  to sit on the chair with arms folded across their chest.   Instructions: "Stand up and sit down as quickly as possible 5 times, keeping your arms folded across your chest."   Measurement: Stop timing when the participant stands the 5th time.  TIME: ___18.4___ (in seconds)  Times > 13.6 seconds is associated with increased disability and morbidity (Guralnik, 2000) Times > 15 seconds is predictive of recurrent falls in healthy individuals aged 23 and older (Buatois, et al., 2008) Normal performance values in community dwelling individuals aged 41 and older (Bohannon, 2006): 60-69 years: 11.4 seconds 70-79 years: 12.6 seconds 80-89 years: 14.8 seconds  MCID: ? 2.3 seconds for Vestibular Disorders (Meretta, 2006)  Reviewed results and interpretation of outcome measures performed during session. Patient demonstrated a 13 point increase on the Berg in 1 week, meeting the MCID. Educated on areas of deficits and will review activities to be performed as HEP tomorrow. Patient stated understanding.   Patient in w/c in the room at end of session with breaks locked, seat belt alarm set, and all needs within reach.   Therapy Documentation Precautions:  Precautions Precautions: Fall Precaution Comments: Maintain BP <160; HOB >30 degrees Restrictions Weight Bearing Restrictions: No Agitated Behavior Scale: TBI Observation Details Observation Environment: [address] [place] Start of observation period - Date: 02/25/21 Start of observation period - Time: 1100 End of observation period - Date: 02/25/21 End of observation period - Time: 1200 Agitated Behavior Scale (DO NOT LEAVE BLANKS) Short attention span, easy distractibility, inability to concentrate: Absent Impulsive, impatient, low tolerance for pain or frustration: Absent Uncooperative, resistant to care, demanding: Absent Violent and/or threatening violence  toward people or property: Absent Explosive and/or unpredictable anger: Absent Rocking,  rubbing, moaning, or other self-stimulating behavior: Absent Pulling at tubes, restraints, etc.: Absent Wandering from treatment areas: Absent Restlessness, pacing, excessive movement: Absent Repetitive behaviors, motor, and/or verbal: Absent Rapid, loud, or excessive talking: Absent Sudden changes of mood: Absent Easily initiated or excessive crying and/or laughter: Absent Self-abusiveness, physical and/or verbal: Absent Agitated behavior scale total score: 14 Balance: Balance Balance Assessed: Yes Standardized Balance Assessment Standardized Balance Assessment: Berg Balance Test Berg Balance Test Sit to Stand: Able to stand without using hands and stabilize independently Standing Unsupported: Able to stand safely 2 minutes Sitting with Back Unsupported but Feet Supported on Floor or Stool: Able to sit safely and securely 2 minutes Stand to Sit: Sits safely with minimal use of hands Transfers: Able to transfer safely, minor use of hands Standing Unsupported with Eyes Closed: Able to stand 10 seconds safely Standing Ubsupported with Feet Together: Able to place feet together independently and stand 1 minute safely From Standing, Reach Forward with Outstretched Arm: Can reach confidently >25 cm (10") From Standing Position, Pick up Object from Floor: Able to pick up shoe safely and easily From Standing Position, Turn to Look Behind Over each Shoulder: Turn sideways only but maintains balance Turn 360 Degrees: Able to turn 360 degrees safely one side only in 4 seconds or less Standing Unsupported, Alternately Place Feet on Step/Stool: Able to complete 4 steps without aid or supervision Standing Unsupported, One Foot in Front: Able to plae foot ahead of the other independently and hold 30 seconds Standing on One Leg: Tries to lift leg/unable to hold 3 seconds but remains standing independently Total Score: 47/56    Therapy/Group: Individual Therapy  Elliett Guarisco L Rankin Coolman PT,  DPT  02/25/2021, 12:50 PM

## 2021-02-26 ENCOUNTER — Telehealth: Payer: Self-pay | Admitting: Cardiovascular Disease

## 2021-02-26 ENCOUNTER — Other Ambulatory Visit (HOSPITAL_COMMUNITY): Payer: Self-pay

## 2021-02-26 LAB — GLUCOSE, CAPILLARY
Glucose-Capillary: 176 mg/dL — ABNORMAL HIGH (ref 70–99)
Glucose-Capillary: 111 mg/dL — ABNORMAL HIGH (ref 70–99)
Glucose-Capillary: 216 mg/dL — ABNORMAL HIGH (ref 70–99)

## 2021-02-26 MED ORDER — NITROGLYCERIN 0.4 MG/SPRAY TL SOLN
1.0000 | 0 refills | Status: DC
  Filled 2021-02-26: qty 10, fill #0

## 2021-02-26 MED ORDER — BUTALBITAL-APAP-CAFFEINE 50-325-40 MG PO TABS
1.0000 | ORAL_TABLET | ORAL | 0 refills | Status: DC | PRN
  Filled 2021-02-26: qty 14, 3d supply, fill #0

## 2021-02-26 MED ORDER — INSULIN GLARGINE 100 UNIT/ML SOLOSTAR PEN
16.0000 [IU] | PEN_INJECTOR | Freq: Every day | SUBCUTANEOUS | 11 refills | Status: DC
Start: 2021-02-26 — End: 2021-05-05
  Filled 2021-02-26: qty 6, 30d supply, fill #0

## 2021-02-26 MED ORDER — OXYCODONE HCL 5 MG PO TABS
5.0000 mg | ORAL_TABLET | Freq: Four times a day (QID) | ORAL | 0 refills | Status: DC | PRN
Start: 2021-02-26 — End: 2021-05-05
  Filled 2021-02-26: qty 30, 7d supply, fill #0

## 2021-02-26 MED ORDER — INSULIN PEN NEEDLE 32G X 4 MM MISC
11 refills | Status: DC
  Filled 2021-02-26: qty 100, 25d supply, fill #0

## 2021-02-26 MED ORDER — ROSUVASTATIN CALCIUM 5 MG PO TABS
ORAL_TABLET | ORAL | 0 refills | Status: DC
  Filled 2021-02-26: qty 30, 90d supply, fill #0

## 2021-02-26 MED ORDER — HYDRALAZINE HCL 100 MG PO TABS
100.0000 mg | ORAL_TABLET | Freq: Three times a day (TID) | ORAL | 0 refills | Status: DC
  Filled 2021-02-26: qty 90, 30d supply, fill #0

## 2021-02-26 MED ORDER — ISOSORBIDE MONONITRATE ER 30 MG PO TB24
90.0000 mg | ORAL_TABLET | Freq: Every day | ORAL | 0 refills | Status: DC
  Filled 2021-02-26: qty 90, 30d supply, fill #0

## 2021-02-26 MED ORDER — INSULIN LISPRO (1 UNIT DIAL) 100 UNIT/ML (KWIKPEN)
4.0000 [IU] | PEN_INJECTOR | Freq: Three times a day (TID) | SUBCUTANEOUS | 11 refills | Status: DC
  Filled 2021-02-26: qty 3, 25d supply, fill #0

## 2021-02-26 MED ORDER — DOCUSATE SODIUM 100 MG PO CAPS: 100.0000 mg | ORAL_CAPSULE | Freq: Every day | ORAL | 0 refills | Status: DC

## 2021-02-26 MED ORDER — APIXABAN 2.5 MG PO TABS
2.5000 mg | ORAL_TABLET | Freq: Two times a day (BID) | ORAL | 0 refills | Status: DC
  Filled 2021-02-26: qty 60, 30d supply, fill #0

## 2021-02-26 MED ORDER — ACETAMINOPHEN 325 MG PO TABS: 650.0000 mg | ORAL_TABLET | ORAL | Status: DC | PRN

## 2021-02-26 NOTE — Progress Notes (Signed)
Patient refused blood draw this morning. Called lab to request someone come later today.

## 2021-02-26 NOTE — Progress Notes (Signed)
Occupational Therapy Session Note  Patient Details  Name: Ruffin Lada MRN: [id number] Date of Birth: 12/10/1940  Today's Date: 02/26/2021 OT Individual Time: 1418-1530 OT Individual Time Calculation (min): 72 min    Short Term Goals: Week 2:  OT Short Term Goal 1 (Week 2): LTG=STG 2/2 ELOS  Skilled Therapeutic Interventions/Progress Updates:    Pt sitting in wheelchair to start with no reports of pain.  He was agreeable to participate in OT treatment and completed functional mobility down to the therapy gym with supervision and no assistive device.  Once in the gym, had him sit on a therapy mat and complete therapy putty exercises using his own medium resistance therapy putty for the RUE.  He was able to complete several repetitions of gross grasp, key pinch, opposition of thumb to all digits, and digit extension following handout given by this therapist.  He then ambulated down to the ortho gym again at supervision where he was able to complete 2 sets using the BITs in sitting.  He integrated the RUE with completion of 2 min interval of visual scanning with 83% accuracy and a reaction time of 1.5 seconds using the RUE.  He then completed the second set of Visual Pursuits with rotary board with an average reaction time of 4.5 seconds for numbers 1-25 with 86% accuracy.  Finished session with ambulation back to the room with supervision and no device where he was left sitting with the call button and phone in reach and safety alarm pad in place.    Therapy Documentation Precautions:  Precautions Precautions: Fall Precaution Comments: Maintain BP <160 Restrictions Weight Bearing Restrictions: No  Pain: Pain Assessment Pain Scale: Faces Pain Score: 0-No pain ADL: See Care Tool Section for some details of mobility and selfcare   Therapy/Group: Individual Therapy  Stormie Ventola OTR/L 02/26/2021, 5:17 PM

## 2021-02-26 NOTE — Progress Notes (Signed)
Patient ID: Kyle Roberson, male   DOB: [birth year], 80 y.o.   MRN: [id number]  SW returned phone call to pt partner Jeannene Patella [phone]) to provide updates from team conference, and d/c date remains 12/8. Sw informed on recommendation for continued outpatient therapies for PT/OT/SLP. She said she will discuss with patient. SW will send referral in the event services are needed when he leaves.  Preferred location is [facility].  *SW received return phone call in which pt is afraid of getting sick due to viruses going around, and has another scheduled surgery that he does not want to push back. She reported that she will notify his PCP is he needs continued therapy. States she will have someone with pt during the week and she will be with him on the weekend. States she will make sure the friend pushes him to exercise while he is here.   Loralee Pacas, MSW, [place] Office: [phone] Cell: [phone] Fax: [phone]

## 2021-02-26 NOTE — Progress Notes (Signed)
Physical Therapy TBI Note  Patient Details  Name: Kyle Roberson MRN: [id number] Date of Birth: [birth year]  Today's Date: 02/26/2021 PT Individual Time: 1000-1044 PT Individual Time Calculation (min): 44 min   Short Term Goals: Week 1:  PT Short Term Goal 1 (Week 1): Pt will perform bed mobility with minA. PT Short Term Goal 1 - Progress (Week 1): Met PT Short Term Goal 2 (Week 1): Pt will perform bed to chair transfer with CGA. PT Short Term Goal 2 - Progress (Week 1): Met PT Short Term Goal 3 (Week 1): Pt will ambulate x150' with LRAD. PT Short Term Goal 3 - Progress (Week 1): Met Week 2:  PT Short Term Goal 1 (Week 2): STGs = LTGs Week 3:     Skilled Therapeutic Interventions/Progress Updates:    Pt seen this am for gait and balance training.  Denies pain.  Transported to gym.  Sit to stand w/cga.  Gait 255f w/no AD w/close supervision initially increasing to cga/ Light min w/fatigue.   Ambulated with decreased gait speed, decreased step length and height, R trendelenburg with lateral hip instability, mild forward trunk lean, and decreased visual scanning esp to R. Provided verbal cues for increased heel strike at initial contact for increased step height and reduced anterior bias. Incorporated sudden stops which consistently resulted in ant balance loss requiring min assist and additional time for recovery/ant bias.  Gait x 122fas described above ( no stop/starts).  Seated resisted hip abd 2x30 w/orange TB resistance for core/hip stability.  Standing alternating forward lunges x 16 Standing alternating lateral lunges x 20 Overall cga to min assist for balance and cues for sequencing/attention to task in busy gym environment.  Pt transported to room.  Pt left oob in wc w/alarm belt set and needs in reach  Therapy Documentation Precautions:  Precautions Precautions: Fall Precaution Comments: Maintain BP <160 Restrictions Weight Bearing Restrictions: No Agitated  Behavior Scale: TBI Observation Details Observation Environment: CIR Start of observation period - Date: 02/26/21 Start of observation period - Time: 1000 End of observation period - Date: 02/26/21 End of observation period - Time: 1044 Agitated Behavior Scale (DO NOT LEAVE BLANKS) Short attention span, easy distractibility, inability to concentrate: Present to a slight degree Impulsive, impatient, low tolerance for pain or frustration: Absent Uncooperative, resistant to care, demanding: Absent Violent and/or threatening violence toward people or property: Absent Explosive and/or unpredictable anger: Absent Rocking, rubbing, moaning, or other self-stimulating behavior: Absent Pulling at tubes, restraints, etc.: Absent Wandering from treatment areas: Absent Restlessness, pacing, excessive movement: Absent Repetitive behaviors, motor, and/or verbal: Absent Rapid, loud, or excessive talking: Absent Sudden changes of mood: Absent Easily initiated or excessive crying and/or laughter: Absent Self-abusiveness, physical and/or verbal: Absent Agitated behavior scale total score: 15    Therapy/Group: Individual Therapy BaCallie FieldingPTBerkley2/09/2020, 10:49 AM

## 2021-02-26 NOTE — Progress Notes (Signed)
PROGRESS NOTE   Subjective/Complaints: Apparently he doesn't get along with lab tech, refused blood draw. Says her attitude was horrible. Hasn't had problems with other techs  ROS: Patient denies fever, rash, sore throat, blurred vision, nausea, vomiting, diarrhea, cough, shortness of breath or chest pain, joint or back pain, headache, or mood change.   Objective:   No results found. Recent Labs    02/24/21 0537  WBC 8.3  HGB 11.8*  HCT 37.1*  PLT 210    Recent Labs    02/24/21 0537 02/25/21 0946  NA 136 138  K 3.5 3.9  CL 100 101  CO2 27 25  GLUCOSE 209* 341*  BUN 53* 51*  CREATININE 2.35* 2.41*  CALCIUM 8.5* 8.7*     Intake/Output Summary (Last 24 hours) at 02/26/2021 0828 Last data filed at 02/26/2021 0100 Gross per 24 hour  Intake 240 ml  Output 700 ml  Net -460 ml        Physical Exam: Vital Signs Blood pressure (!) 178/63, pulse (!) 58, temperature 98.4 F (36.9 C), resp. rate 14, height 5\' 8"  (1.727 m), weight 99.5 kg, SpO2 97 %. Constitutional: No distress . Vital signs reviewed. HEENT: NCAT, EOMI, oral membranes moist Neck: supple Cardiovascular: RRR without murmur. No JVD    Respiratory/Chest: CTA Bilaterally without wheezes or rales. Normal effort    GI/Abdomen: BS +, non-tender, non-distended Ext: no clubbing, cyanosis, or edema Psych: pleasant and cooperative  Skin: Warm and dry.  Incision CDI Musc: No edema in extremities.  No tenderness in extremities. Neuro: Alert. Word finding deficits are better. Improved insight and awareness as a whole.  Motor: RUE: 4/5 prox to distal. LUE 5-/5. RLE 4/5 prox to distal, LLE 4+ to 5-/5, stable  Assessment/Plan: 1. Functional deficits which require 3+ hours per day of interdisciplinary therapy in a comprehensive inpatient rehab setting. Physiatrist is providing close team supervision and 24 hour management of active medical problems listed  below. Physiatrist and rehab team continue to assess barriers to discharge/monitor patient progress toward functional and medical goals  Care Tool:  Bathing    Body parts bathed by patient: Right arm, Left arm, Chest, Abdomen, Buttocks, Front perineal area, Left upper leg, Right upper leg, Left lower leg, Right lower leg, Face   Body parts bathed by helper: Left arm, Buttocks, Right lower leg, Left lower leg     Bathing assist Assist Level: Supervision/Verbal cueing     Upper Body Dressing/Undressing Upper body dressing   What is the patient wearing?: Pull over shirt    Upper body assist Assist Level: Supervision/Verbal cueing    Lower Body Dressing/Undressing Lower body dressing      What is the patient wearing?: Underwear/pull up, Pants     Lower body assist Assist for lower body dressing: Minimal Assistance - Patient > 75%     Toileting Toileting    Toileting assist Assist for toileting: Supervision/Verbal cueing     Transfers Chair/bed transfer  Transfers assist     Chair/bed transfer assist level: Minimal Assistance - Patient > 75%     Locomotion Ambulation   Ambulation assist      Assist level: Supervision/Verbal cueing Assistive  device: No Device Max distance: 150   Walk 10 feet activity   Assist     Assist level: Supervision/Verbal cueing Assistive device: No Device   Walk 50 feet activity   Assist    Assist level: Supervision/Verbal cueing Assistive device: No Device    Walk 150 feet activity   Assist    Assist level: Supervision/Verbal cueing Assistive device: No Device    Walk 10 feet on uneven surface  activity   Assist     Assist level: Minimal Assistance - Patient > 75%     Wheelchair     Assist Is the patient using a wheelchair?: No             Wheelchair 50 feet with 2 turns activity    Assist            Wheelchair 150 feet activity     Assist          Blood pressure (!)  178/63, pulse (!) 58, temperature 98.4 F (36.9 C), resp. rate 14, height 5\' 8"  (1.727 m), weight 99.5 kg, SpO2 97 %.  Medical Problem List and Plan: 1.  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  Status post craniotomy evacuation 02/05/2021 -Continue CIR therapies including PT, OT, and SLP  2.  Antithrombotics: -DVT/anticoagulation:  Pharmaceutical: Other (comment) Eliquis             -antiplatelet therapy: N/A 3. Pain: continue Fioricet, oxycodone  Controlled with meds on 12/4 4. Mood: Provide emotional support  -monitor interactions with staff             -antipsychotic agents: N/A 5. Neuropsych: This patient is capable of making decisions on his own behalf. 6. Skin/Wound Care: staples out of head 7. Fluids/Electrolytes/Nutrition: Routine in and out  -low albumin---dc supp as pt refusing 8.  Diabetes mellitus peripheral neuropathy.  Hemoglobin A1c 7.3.  NovoLog 3 units 4 times daily with meals  CBG (last 3)  Recent Labs    02/25/21 1205 02/25/21 1654 02/25/21 2121  GLUCAP 206* 164* 155*  Increased semglee to 13u daily on 12/2 12/7, increased semglee to 16u effective today 9.  CKD stage IV.  Continue torsemide for now, Zaroxolyn on hold.  -Cr 2.0 on 11/27-->2.35 today, holding torsemide d/t AKI  12/7 Cr still elevated yesterday. If remains up, will add IVF today (spoke to pt about this)  -continue to hold demadex 10.  Obesity.  BMI 34.97.  Dietary follow-up 11.  Chronic atrial fibrillation.  Cardiac rate controlled.  Eliquis resumed 02/11/2021 12.  Hypertension.  Imdur 30 mg daily, Demadex 60 mg daily   Vitals:   02/25/21 2027 02/26/21 0436  BP: (!) 147/57 (!) 178/63  Pulse: (!) 47 (!) 58  Resp: 14 14  Temp: 98.1 F (36.7 C) 98.4 F (36.9 C)  SpO2: 98% 97%  Also on Hydralazine 100mg  TID  Adjusted imdur to 90mg  starting 11/30    Some elevationt today, demadex on hold for #9. May be related to event with lab tech. He was  quite angry when I entered 13.  Chronic diastolic congestive heart failure.  Continue Demadex 60 mg daily.  Monitor for any signs of fluid overload  - oxygen at HS scheduled Filed Weights   02/21/21 1034 02/24/21 0250 02/25/21 0947  Weight: 99.5 kg 97.6 kg 99.5 kg    -weights down (based on 2 readings)  -holding torsemide (see above)   -I NEED WEIGHT TODAY 12/7 14.  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  Continue eyedrops. 15.  CAD with history of stenting.  Continue Imdur.  No chest pain or shortness of breath  ?right carotid artery dissection from trauma?-  + flow on MRA earlier this month. No other carotid studies in system   16. Constipation: added colace 100mg  daily.  17. Bradycardia: continue to monitor, will not decrease medications given elevated SBP 18. OSA: schedule oxygen, outpt sleep study    LOS: 15 days A FACE TO FACE EVALUATION WAS PERFORMED  Meredith Staggers 02/26/2021, 8:28 AM

## 2021-02-26 NOTE — Progress Notes (Signed)
Speech Language Pathology Discharge Summary  Patient Details  Name: Kyle Roberson MRN: [id number] Date of Birth: Jul 31, 1940  Today's Date: 02/26/2021 SLP Individual Time: 0920-1000 SLP Individual Time Calculation (min): 40 min   Skilled Therapeutic Interventions:  Skilled treatment session focused on cognitive-linguistic goals. Patient initially verbose regarding a situation that happened this morning, patient redirected to task with supervision level verbal cues. Patient was Mod I for word-finding throughout complex, abstract language tasks without phonemic paraphasias. However, patient demonstrated difficulty with complex reasoning despite multiple repetitions explaining procedures to tasks. Patient often used humor to mask difficulty with tasks. Patient handed off to PT.    Patient has met 5 of 5 long term goals.  Patient to discharge at overall Supervision level.   Reasons goals not met: N/A   Clinical Impression/Discharge Summary: Patient has made functional gains and has met 5 of 5 LTGs this admission. Currently, patient demonstrates behaviors consistent with a Rancho Level VIII and requires overall supervision level verbal cues to complete functional and mildly complex tasks safely in regards to problem solving, selective attention, recall with use of strategies and emergent awareness. Patient also demonstrates improved functional communication and is overall Mod I for word-finding with extra time and intermittent supervision level verbal cues needed to self-monitor and correct phonemic paraphasias. Patient and family education is complete and patient will discharge home with assistance from family. Patient would benefit from f/u SLP services to maximize his cognitive-linguistic functioning and overall functional independence in order to reduce caregiver burden.   Care Partner:  Caregiver Able to Provide Assistance: Yes  Type of Caregiver Assistance:  Physical;Cognitive  Recommendation:  24 hour supervision/assistance;Outpatient SLP  Rationale for SLP Follow Up: Reduce caregiver burden;Maximize cognitive function and independence;Maximize functional communication   Equipment: N/A   Reasons for discharge: Discharged from hospital;Treatment goals met   Patient/Family Agrees with Progress Made and Goals Achieved: Yes    Sharan Mcenaney 02/26/2021, 6:27 AM

## 2021-02-26 NOTE — Progress Notes (Signed)
Inpatient Rehabilitation Discharge Medication Review by a Pharmacist  A complete drug regimen review was completed for this patient to identify any potential clinically significant medication issues.  High Risk Drug Classes Is patient taking? Indication by Medication  Antipsychotic No   Anticoagulant Yes Apixaban- Chronic A. fib  Antibiotic No   Opioid Yes OxyIR- pain  Antiplatelet No   Hypoglycemics/insulin Yes Semglee- T2DM  Vasoactive Medication Yes Imdur, apresoline- Hypertension  Chemotherapy No   Other Yes Crestor- HLD     Type of Medication Issue Identified Description of Issue Recommendation(s)  Drug Interaction(s) (clinically significant)     Duplicate Therapy     Allergy     No Medication Administration End Date     Incorrect Dose     Additional Drug Therapy Needed     Significant med changes from prior encounter (inform family/care partners about these prior to discharge).    Other  PTA meds- Glipizide, metolazone, insulin NPH Continue meds at discharge if clinically appropriate     Clinically significant medication issues were identified that warrant physician communication and completion of prescribed/recommended actions by midnight of the next day:  No  Time spent performing this drug regimen review (minutes):  30   Safwan Tomei BS, PharmD, BCPS Clinical Pharmacist  02/26/2021 9:17 AM

## 2021-02-26 NOTE — Progress Notes (Signed)
Physical Therapy Discharge Summary  Patient Details  Name: Kyle Roberson MRN: [id number] Date of Birth: 1940/09/28  Today's Date: 02/26/2021 PT Individual Time: 0800-0900 PT Individual Time Calculation (min): 60 min    Patient has met 9 of 9 long term goals due to improved activity tolerance, improved balance, improved postural control, increased strength, ability to compensate for deficits, improved attention, improved awareness, and improved coordination.  Patient to discharge at an ambulatory level Supervision without an AD.   Patient's care partner is independent to provide the necessary physical and cognitive assistance at discharge.  Reasons goals not met: n/a  Recommendation:  Patient will benefit from ongoing skilled PT services in outpatient setting to continue to advance safe functional mobility, address ongoing impairments in balance, activity tolerance, dynamic gait, community integration, dual task training, patient/caregiver education, and minimize fall risk.  Equipment: No equipment provided  Reasons for discharge: treatment goals met  Patient/family agrees with progress made and goals achieved: Yes  Skilled Therapeutic Intervention: Patient in bed upon PT arrival. Patient alert and agreeable to PT session. Patient reported 4/10 headache during session, RN made aware. PT provided repositioning, rest breaks, and distraction as pain interventions throughout session.   Patient verbose and perseverative on poor interaction with staff this morning, PT provided therapeutic listening x1 then provided mod redirection to initiate mobility.  Therapeutic Activity: Bed Mobility: Patient performed supine to/from sit with supervision with use of hospital bed functions, patient reports that he sleeps in a recliner at baseline and does not perform bed mobility. Donned TED hose dependently bed level, pants with min A sitting to thread lower extremities and standing to pull pants over  hips. Donned socks and shoes with total A for energy/time management.  Transfers: Patient performed sit to/from stand from the bed, w/c, standard arm chair, mat table, and toilet independently without AD throughout session. Patient was continent of bowl and bladder during toileting, performed peri-care and hand hygiene independently, and lower body clothing management with min A. He performed oral hygiene at the sink in standing following hand hygiene with distant supervision-independently for standing balance >4 min.   Gait Training:  Patient ambulated in the room with distant supervision x2 and >150 feet x2 without an AD with supervision. Ambulated with decreased gait speed, decreased step length and height, R trendelenburg with lateral hip instability, mild forward trunk lean, and decreased visual scanning. Provided verbal cues for increased heel strike at initial contact for increased step height and reduced anterior bias for improved balance and increased arm swing for improved gait speed and balance. Patient ascended/descended 12x6" steps using B rails with CGA. Performed reciprocal gait pattern while ascending and step-to gait pattern while descending. Provided min cues for technique and sequencing.   Provided patient with modified [place] [facility] handout including: sit to stand without hands x10, tandem stance without upper extremity support 3x10 sec, SLS with single upper extremity support 3x10 sec, backwards walking at a counter x4 laps, figure eight walking x4 laps, mini squats with upper extremity support x10, and instructed to hold off on stairs at this time due to increased fall risk. Verbally reviewed instructions for exercises and hand wrote modifications. Instructed patient to perform exercises 1-2 times per day 6-7 days per week for improved strength, balance, and postural control for reduced fall risk with functional mobility. Patient in agreement.   Patient in w/c in the room at end of  session with breaks locked, seat belt alarm set, and all needs within reach.  PT Discharge Precautions/Restrictions Precautions Precautions: Fall Precaution Comments: Maintain BP <160 Restrictions Weight Bearing Restrictions: No Pain Interference Pain Interference Pain Effect on Sleep: 1. Rarely or not at all Pain Interference with Therapy Activities: 1. Rarely or not at all Pain Interference with Day-to-Day Activities: 1. Rarely or not at all Vision/Perception  Vision - History Ability to See in Adequate Light: 1 Impaired (able to see with R eye only) Perception Inattention/Neglect: Does not attend to right side of body (demonstrates good compensatory strategies) Praxis Praxis: Intact  Cognition Overall Cognitive Status: Impaired/Different from baseline Arousal/Alertness: Awake/alert Orientation Level: Oriented X4 Attention: Selective Selective Attention: Impaired Selective Attention Impairment: Verbal complex;Functional complex Memory: Impaired Memory Impairment: Decreased recall of new information;Decreased short term memory Awareness: Impaired Awareness Impairment: Anticipatory impairment Problem Solving: Impaired Problem Solving Impairment: Functional complex Safety/Judgment: Impaired Rancho Los Amigos Scales of Cognitive Functioning: Purposeful/appropriate Sensation Sensation Light Touch: Appears Intact Proprioception: Impaired Detail Additional Comments: Decreased proprioception of R UE>LE Coordination Gross Motor Movements are Fluid and Coordinated: No Fine Motor Movements are Fluid and Coordinated: No Coordination and Movement Description: much improved coordination with R hand Heel Shin Test: slow and deliberate Motor  Motor Motor: Hemiplegia;Abnormal postural alignment and control Motor - Discharge Observations: Mild R hemiplegia  Mobility Transfers Transfers: Sit to Stand;Stand to Sit;Stand Pivot Transfers Sit to Stand: Independent Stand to Sit:  Independent Stand Pivot Transfers: Independent Transfer (Assistive device): None Locomotion  Gait Ambulation: Yes Gait Assistance: Supervision/Verbal cueing Gait Distance (Feet): 690 Feet (during Six Minute Walk Test on 02/25/21) Assistive device: None Gait Gait: Yes Gait Pattern: Impaired Gait Pattern: Decreased hip/knee flexion - left;Decreased hip/knee flexion - right;Step-through pattern;Decreased stride length;Trendelenburg Gait velocity: Avg. speed on Six Minute Walk Test: 0.6 m/s Stairs / Additional Locomotion Stairs: Yes Stairs Assistance: Contact Guard/Touching assist Stair Management Technique: Two rails Number of Stairs: 12 Height of Stairs: 6 Ramp: Supervision/Verbal cueing Curb: Nurse, mental health Mobility: No  Trunk/Postural Assessment  Cervical Assessment Cervical Assessment: Exceptions to [facility] (forward head) Thoracic Assessment Thoracic Assessment: Exceptions to [facility] (rounded shoulders) Lumbar Assessment Lumbar Assessment: Exceptions to [facility] (posterior pelvic tilt) Postural Control Postural Control: Deficits on evaluation Righting Reactions: delayed/isufficient Postural Limitations: delayed/isufficient  Balance Balance Balance Assessed: Yes Standardized Balance Assessment Standardized Balance Assessment: Berg Balance Test Berg Balance Test Sit to Stand: Able to stand without using hands and stabilize independently Standing Unsupported: Able to stand safely 2 minutes Sitting with Back Unsupported but Feet Supported on Floor or Stool: Able to sit safely and securely 2 minutes Stand to Sit: Sits safely with minimal use of hands Transfers: Able to transfer safely, minor use of hands Standing Unsupported with Eyes Closed: Able to stand 10 seconds safely Standing Ubsupported with Feet Together: Able to place feet together independently and stand 1 minute safely From Standing, Reach Forward with Outstretched Arm: Can reach  confidently >25 cm (10") From Standing Position, Pick up Object from Floor: Able to pick up shoe safely and easily From Standing Position, Turn to Look Behind Over each Shoulder: Turn sideways only but maintains balance Turn 360 Degrees: Able to turn 360 degrees safely one side only in 4 seconds or less Standing Unsupported, Alternately Place Feet on Step/Stool: Able to complete 4 steps without aid or supervision Standing Unsupported, One Foot in Front: Able to plae foot ahead of the other independently and hold 30 seconds Standing on One Leg: Tries to lift leg/unable to hold 3 seconds but remains standing independently Total Score: 47 Static  Sitting Balance Static Sitting - Level of Assistance: 7: Independent Dynamic Sitting Balance Dynamic Sitting - Level of Assistance: 7: Independent Static Standing Balance Static Standing - Balance Support: During functional activity;No upper extremity supported Static Standing - Level of Assistance: 7: Independent Dynamic Standing Balance Dynamic Standing - Balance Support: During functional activity;No upper extremity supported Dynamic Standing - Level of Assistance: 5: Stand by assistance Extremity Assessment  RLE Assessment RLE Assessment: Exceptions to [facility] General Strength Comments: Grossly in sitting: 4+/5 hip flexion and knee extension, otherwise 5/5 throughout LLE Assessment LLE Assessment: Exceptions to [facility] General Strength Comments: Grossly in sitting: 4/5 PF, otherwise 5/5 throughout    Kyle Roberson L Radiance Deady PT, DPT  02/26/2021, 12:38 PM

## 2021-02-26 NOTE — Telephone Encounter (Signed)
Patient's SO would like to discuss his medications.States his Isosorbide mg is too high. States patient is to be D/C from [facility] tomorrow. Please call to discuss.

## 2021-02-27 ENCOUNTER — Other Ambulatory Visit (HOSPITAL_COMMUNITY): Payer: Self-pay

## 2021-02-27 LAB — BASIC METABOLIC PANEL
Anion gap: 10 (ref 5–15)
BUN: 47 mg/dL — ABNORMAL HIGH (ref 8–23)
CO2: 24 mmol/L (ref 22–32)
Calcium: 8.8 mg/dL — ABNORMAL LOW (ref 8.9–10.3)
Chloride: 106 mmol/L (ref 98–111)
Creatinine, Ser: 2.1 mg/dL — ABNORMAL HIGH (ref 0.61–1.24)
GFR, Estimated: 31 mL/min — ABNORMAL LOW (ref >60)
Glucose, Bld: 162 mg/dL — ABNORMAL HIGH (ref 70–99)
Potassium: 3.7 mmol/L (ref 3.5–5.1)
Sodium: 140 mmol/L (ref 135–145)

## 2021-02-27 LAB — GLUCOSE, CAPILLARY
Glucose-Capillary: 159 mg/dL — ABNORMAL HIGH (ref 70–99)
Glucose-Capillary: 168 mg/dL — ABNORMAL HIGH (ref 70–99)

## 2021-02-27 MED ORDER — TORSEMIDE 20 MG PO TABS
40.0000 mg | ORAL_TABLET | Freq: Every day | ORAL | 0 refills | Status: DC
  Filled 2021-02-27: qty 60, 30d supply, fill #0

## 2021-02-27 NOTE — Progress Notes (Signed)
PROGRESS NOTE   Subjective/Complaints: Pt in good spirits because he's going home today. Refused blood draws yesterday and repeat did not get done either. Labs were collected today  ROS: Patient denies fever, rash, sore throat, blurred vision, nausea, vomiting, diarrhea, cough, shortness of breath or chest pain, joint or back pain, headache, or mood change.   Objective:   No results found. No results for input(s): WBC, HGB, HCT, PLT in the last 72 hours.   Recent Labs    02/25/21 0946 02/27/21 0603  NA 138 140  K 3.9 3.7  CL 101 106  CO2 25 24  GLUCOSE 341* 162*  BUN 51* 47*  CREATININE 2.41* 2.10*  CALCIUM 8.7* 8.8*     Intake/Output Summary (Last 24 hours) at 02/27/2021 0908 Last data filed at 02/27/2021 0215 Gross per 24 hour  Intake 360 ml  Output 700 ml  Net -340 ml        Physical Exam: Vital Signs Blood pressure (!) 158/53, pulse (!) 50, temperature 98 F (36.7 C), resp. rate 14, height 5\' 8"  (1.727 m), weight 99.5 kg, SpO2 97 %. Constitutional: No distress . Vital signs reviewed. HEENT: NCAT, EOMI, oral membranes moist Neck: supple Cardiovascular: RRR without murmur. No JVD    Respiratory/Chest: CTA Bilaterally without wheezes or rales. Normal effort    GI/Abdomen: BS +, non-tender, non-distended Ext: no clubbing, cyanosis, or edema Psych:  cooperative  Skin: Warm and dry.  Incision CDI Musc: No edema in extremities.  No tenderness in extremities. Neuro: Alert. Word finding much better. Improved insight and awareness as a whole.  Motor: RUE: 4/5 prox to distal. LUE 5-/5. RLE 4/5 prox to distal, LLE 4+ to 5-/5, stable  Assessment/Plan: 1. Functional deficits which require 3+ hours per day of interdisciplinary therapy in a comprehensive inpatient rehab setting. Physiatrist is providing close team supervision and 24 hour management of active medical problems listed below. Physiatrist and rehab team  continue to assess barriers to discharge/monitor patient progress toward functional and medical goals  Care Tool:  Bathing    Body parts bathed by patient: Right arm, Left arm, Chest, Abdomen, Buttocks, Front perineal area, Left upper leg, Right upper leg, Left lower leg, Right lower leg, Face   Body parts bathed by helper: Left arm, Buttocks, Right lower leg, Left lower leg     Bathing assist Assist Level: Supervision/Verbal cueing     Upper Body Dressing/Undressing Upper body dressing   What is the patient wearing?: Pull over shirt    Upper body assist Assist Level: Supervision/Verbal cueing    Lower Body Dressing/Undressing Lower body dressing      What is the patient wearing?: Underwear/pull up, Pants     Lower body assist Assist for lower body dressing: Minimal Assistance - Patient > 75%     Toileting Toileting    Toileting assist Assist for toileting: Supervision/Verbal cueing     Transfers Chair/bed transfer  Transfers assist     Chair/bed transfer assist level: Independent     Locomotion Ambulation   Ambulation assist      Assist level: Supervision/Verbal cueing Assistive device: No Device Max distance: 690 ft   Walk 10 feet  activity   Assist     Assist level: Supervision/Verbal cueing Assistive device: No Device   Walk 50 feet activity   Assist    Assist level: Supervision/Verbal cueing Assistive device: No Device    Walk 150 feet activity   Assist    Assist level: Supervision/Verbal cueing Assistive device: No Device    Walk 10 feet on uneven surface  activity   Assist     Assist level: Supervision/Verbal cueing     Wheelchair     Assist Is the patient using a wheelchair?: No             Wheelchair 50 feet with 2 turns activity    Assist            Wheelchair 150 feet activity     Assist          Blood pressure (!) 158/53, pulse (!) 50, temperature 98 F (36.7 C), resp. rate 14,  height 5\' 8"  (1.727 m), weight 99.5 kg, SpO2 97 %.  Medical Problem List and Plan: 1.  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  Status post craniotomy evacuation 02/05/2021 -dc home today. F/u with pcp, ns, cardiology -f/u with [facility] in 4 weeks 2.  Antithrombotics: -DVT/anticoagulation:  Pharmaceutical: Other (comment) Eliquis             -antiplatelet therapy: N/A 3. Pain: continue Fioricet, oxycodone  Controlled with meds on 12/8 4. Mood: Provide emotional support  -monitor interactions with staff             -antipsychotic agents: N/A 5. Neuropsych: This patient is capable of making decisions on his own behalf. 6. Skin/Wound Care: staples out of head 7. Fluids/Electrolytes/Nutrition: Routine in and out  -low albumin---dc supp as pt refusing 8.  Diabetes mellitus peripheral neuropathy.  Hemoglobin A1c 7.3.  NovoLog 3 units 4 times daily with meals  CBG (last 3)  Recent Labs    02/26/21 1653 02/26/21 2224 02/27/21 0643  GLUCAP 111* 216* 159*  Increased semglee to 13u daily on 12/2 12/8 discharge on semglee  16u  9.  CKD stage IV.  Continue torsemide for now, Zaroxolyn on hold.  -Cr 2.0 on 11/27-->2.35 today, holding torsemide d/t AKI  12/8 cr down to 2.1, continue to hold torsemide today and tomorrow, then resume at 40mg  daily 10.  Obesity.  BMI 34.97.  Dietary follow-up 11.  Chronic atrial fibrillation.  Cardiac rate controlled.  Eliquis resumed 02/11/2021 12.  Hypertension.  Imdur 30 mg daily, Demadex 60 mg daily   Vitals:   02/26/21 2013 02/27/21 0438  BP:  (!) 158/53  Pulse: (!) 56 (!) 50  Resp:  14  Temp:  98 F (36.7 C)  SpO2:  97%  Also on Hydralazine 100mg  TID  Adjusted imdur to 90mg  starting 11/30    Some elevationt today, demadex on hold for #9. May be related to event with lab tech. He was quite angry when I entered 13.  Chronic diastolic congestive heart failure.  Continue Demadex 60 mg daily.  Monitor for  any signs of fluid overload  - oxygen at HS scheduled Filed Weights   02/21/21 1034 02/24/21 0250 02/25/21 0947  Weight: 99.5 kg 97.6 kg 99.5 kg    -weights down (based on 2 readings), I/O's  -holding torsemide (see above)   -still no weight recorded yesterday 14.  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  Continue eyedrops. 15.  CAD with history of  stenting.  Continue Imdur.  No chest pain or shortness of breath  ?right carotid artery dissection from trauma?-  + flow on MRA earlier this month. No other carotid studies in system   16. Constipation: added colace 100mg  daily. +bm 12/6 17. Bradycardia: continue to monitor, will not decrease medications given elevated SBP 18. OSA: schedule oxygen, outpt sleep study    LOS: 16 days A FACE TO FACE EVALUATION WAS PERFORMED  Kyle Roberson 02/27/2021, 9:08 AM

## 2021-02-27 NOTE — Progress Notes (Incomplete)
Had a quite night. Happy to be going home today.

## 2021-02-27 NOTE — Progress Notes (Signed)
INPATIENT REHABILITATION DISCHARGE NOTE   Discharge instructions by: Linna Hoff PA-C  Verbalized understanding: yes  Skin care/Wound care healing? None  Pain: none  IV's: removed  Tubes/Drains: Removed  O2: room air   Safety instructions: given to pt by PA  Patient belongings: sent with family   Discharged to: home   Discharged via: W/c To [name]   Notes:

## 2021-02-27 NOTE — Progress Notes (Signed)
Inpatient Rehabilitation Care Coordinator Discharge Note   Patient Details  Name: Kyle Roberson MRN: [id number] Date of Birth: [birth year]   Discharge location: D/c  to home with his partner  Length of Stay: 15 days  Discharge activity level: Supervision  Home/community participation: Limited  Patient response SF:KCLEXN Literacy - How often do you need to have someone help you when you read instructions, pamphlets, or other written material from your doctor or pharmacy?: Never  Patient response TZ:GYFVCB Isolation - How often do you feel lonely or isolated from those around you?: Never  Services provided included: MD, RD, PT, OT, SLP, RN, Pharmacy, Neuropsych, SW, TR, CM  Financial Services:  Charity fundraiser Utilized: [place] Medicare  Choices offered to/list presented to: N/A  Follow-up services arranged:  Outpatient    Outpatient Servicies: Pt declined serivces at this time   Patient response to transportation need: Is the patient able to respond to transportation needs?: Yes In the past 12 months, has lack of transportation kept you from medical appointments or from getting medications?: No In the past 12 months, has lack of transportation kept you from meetings, work, or from getting things needed for daily living?: No  Comments (or additional information):  Patient/Family verbalized understanding of follow-up arrangements:  Yes  Individual responsible for coordination of the follow-up plan: [name] (s/o) [phone]  Confirmed correct DME delivered: Rana Snare 02/27/2021    Rana Snare

## 2021-02-28 NOTE — Telephone Encounter (Signed)
No ans no vm   °

## 2021-03-04 ENCOUNTER — Other Ambulatory Visit: Payer: Self-pay | Admitting: Neurosurgery

## 2021-03-04 DIAGNOSIS — S065XAA Traumatic subdural hemorrhage with loss of consciousness status unknown, initial encounter: Secondary | ICD-10-CM

## 2021-03-05 ENCOUNTER — Ambulatory Visit: Payer: Medicare HMO

## 2021-03-11 ENCOUNTER — Other Ambulatory Visit: Payer: Self-pay

## 2021-03-11 ENCOUNTER — Ambulatory Visit
Admission: RE | Admit: 2021-03-11 | Discharge: 2021-03-11 | Disposition: A | Discharge status: Home / Self Care | Payer: Medicare HMO | Source: Ambulatory Visit | Attending: Neurosurgery | Admitting: Neurosurgery

## 2021-03-11 DIAGNOSIS — S065XAA Traumatic subdural hemorrhage with loss of consciousness status unknown, initial encounter: Secondary | ICD-10-CM | POA: Diagnosis not present

## 2021-03-12 ENCOUNTER — Encounter: Payer: Managed Care, Other (non HMO) | Admitting: Physical Medicine & Rehabilitation

## 2021-03-19 ENCOUNTER — Ambulatory Visit: Payer: Medicare HMO | Admitting: Internal Medicine

## 2021-03-26 ENCOUNTER — Other Ambulatory Visit: Payer: Self-pay | Admitting: Internal Medicine

## 2021-03-26 NOTE — Telephone Encounter (Signed)
Medication Refill - Medication: isosorbide mononitrate (IMDUR) 30 MG   Has the patient contacted their pharmacy? No. (Agent: If no, request that the patient contact the pharmacy for the refill. If patient does not wish to contact the pharmacy document the reason why and proceed with request.) (Got prescription from hosp and need pcp to make a new prescription   Preferred Pharmacy (with phone number or street name):  [facility] [place], [place] [address] [place] [address]  Phone:  [phone] Fax:  [phone]    Has the patient been seen for an appointment in the last year OR does the patient have an upcoming appointment? Yes.    Agent: Please be advised that RX refills may take up to 3 business days. We ask that you follow-up with your pharmacy.

## 2021-03-27 NOTE — Telephone Encounter (Signed)
Requested medications are due for refill today.  unsure  Requested medications are on the active medications list.  yes  Last refill. 02/27/2021 #90 /0 refills  Future visit scheduled.   yes  Notes to clinic.  Rx signed by Lauraine Rinne.    Requested Prescriptions  Pending Prescriptions Disp Refills   isosorbide mononitrate (IMDUR) 30 MG 24 hr tablet 90 tablet 0    Sig: Take 3 tablets (90 mg total) by mouth daily.     Cardiovascular:  Nitrates Failed - 03/26/2021  2:11 PM      Failed - Last BP in normal range    BP Readings from Last 1 Encounters:  02/27/21 (!) 158/53          Passed - Last Heart Rate in normal range    Pulse Readings from Last 1 Encounters:  02/27/21 (!) 50          Passed - Valid encounter within last 12 months    Recent Outpatient Visits           4 months ago Type II diabetes mellitus with complication [facility])   [facility] Glean Hess, MD   9 months ago Type II diabetes mellitus with complication [facility])   [facility] Glean Hess, MD   1 year ago Type 2 diabetes mellitus with stage 4 chronic kidney disease, with long-term current use of insulin [facility])   [facility] Glean Hess, MD   1 year ago Type II diabetes mellitus with complication [facility])   [facility] Glean Hess, MD   1 year ago Chronic heart failure with preserved ejection fraction (HFpEF) [facility])   [facility] Glean Hess, MD       Future Appointments             In 6 days Glean Hess, MD [facility], [place]   In 2 months [place], Kathlene November, MD [facility], [place]

## 2021-03-27 NOTE — Telephone Encounter (Signed)
Please review.  KP

## 2021-03-28 NOTE — Telephone Encounter (Signed)
Please review refill for this patient.  KP

## 2021-04-02 ENCOUNTER — Ambulatory Visit: Payer: Medicare HMO | Admitting: Internal Medicine

## 2021-04-04 ENCOUNTER — Ambulatory Visit: Payer: BC Managed Care – PPO | Admitting: Internal Medicine

## 2021-04-04 ENCOUNTER — Telehealth: Payer: Self-pay | Admitting: Cardiovascular Disease

## 2021-04-04 NOTE — Telephone Encounter (Signed)
Patient is scheduled for 1/17 with [name]

## 2021-04-04 NOTE — Telephone Encounter (Signed)
Attempted to reach back out to pt's significance other, unable to make contact. Phone rings then goes straight to VM Cannot LM, mailbox full Will try to call back at later time  BP Meds Hydralazine 100 mg TID IMDUR 90 daily  Last night 196/101 Today 198/99 If pt has taken his BP meds and still this elevated, may need ER to r/o stroke if he starts to experience headache, dizziness, or weakness.   Appt 1/17 at 08:40 am with [name]

## 2021-04-04 NOTE — Telephone Encounter (Signed)
Pt c/o BP issue: STAT if pt c/o blurred vision, one-sided weakness or slurred speech  1. What are your last 5 BP readings?  Last night 196/101 Today 198/99  2. Are you having any other symptoms (ex. Dizziness, headache, blurred vision, passed out)? No symptoms   3. What is your BP issue? elevated

## 2021-04-04 NOTE — Telephone Encounter (Signed)
Was able to reach back out to pt's significance other [name] (DPR approved) discuss pt's BP and his medications.   BP Meds Hydralazine 100 mg TID IMDUR 90 daily  [name] reports pt has been taking both but BP still elevated. Voiced she has an appt with [name] on Tuesday to discuss medications.   Advised on ER Visit before Tuesday if BP remains 190-200s even after medication as this can lead to stroke. If pt develops headache, dizziness, or weakness, even CP, then please seek the ER for evaluation. [name] verbalized understanding.  Otherwise all questions were address and no additional concerns at this time. [name] thankful for the return call and advice. Agreeable to plan, will call back for anything further.

## 2021-04-07 ENCOUNTER — Ambulatory Visit (INDEPENDENT_AMBULATORY_CARE_PROVIDER_SITE_OTHER): Payer: Medicare HMO | Admitting: Cardiovascular Disease

## 2021-04-07 ENCOUNTER — Encounter: Payer: Self-pay | Admitting: Cardiovascular Disease

## 2021-04-07 ENCOUNTER — Other Ambulatory Visit: Payer: Self-pay

## 2021-04-07 VITALS — BP 148/54 | HR 61 | Ht 68.0 in | Wt 234.4 lb

## 2021-04-07 DIAGNOSIS — I482 Chronic atrial fibrillation, unspecified: Secondary | ICD-10-CM

## 2021-04-07 DIAGNOSIS — I10 Essential (primary) hypertension: Secondary | ICD-10-CM

## 2021-04-07 DIAGNOSIS — E1169 Type 2 diabetes mellitus with other specified complication: Secondary | ICD-10-CM

## 2021-04-07 DIAGNOSIS — E785 Hyperlipidemia, unspecified: Secondary | ICD-10-CM

## 2021-04-07 DIAGNOSIS — I5032 Chronic diastolic (congestive) heart failure: Secondary | ICD-10-CM

## 2021-04-07 DIAGNOSIS — I25118 Atherosclerotic heart disease of native coronary 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: Secondary | ICD-10-CM

## 2021-04-07 MED ORDER — HYDRALAZINE HCL 50 MG PO TABS: ORAL_TABLET | ORAL | 3 refills | Status: DC

## 2021-04-07 MED ORDER — HYDRALAZINE HCL 100 MG PO TABS: ORAL_TABLET | ORAL | 3 refills | Status: DC

## 2021-04-07 MED ORDER — DOXAZOSIN MESYLATE 1 MG PO TABS: 1.0000 mg | ORAL_TABLET | Freq: Two times a day (BID) | ORAL | 3 refills | Status: DC

## 2021-04-07 MED ORDER — METOLAZONE 5 MG PO TABS: 5.0000 mg | ORAL_TABLET | ORAL | 3 refills | Status: DC | PRN

## 2021-04-07 NOTE — Telephone Encounter (Signed)
Attempted to schedule.  

## 2021-04-07 NOTE — Progress Notes (Signed)
Cardiology Office Note  Date:  04/07/2021   ID:  Kyle Roberson, DOB 08/21/1940, MRN [id number]  PCP:  Glean Hess, MD   Chief Complaint  Patient presents with   Hypertension    Patient c/o LE edema. Medications reviewed by the patient verbally.     HPI:  Kyle Roberson is a 81 y.o. male with history of  coronary artery disease,  persistent atrial fibrillation,  hypertension,  hyperlipidemia,  type 2 diabetes mellitus,  chronic kidney disease,  GERD, and  hyperparathyroidism,  who presents for follow-up of chronic HFpEF and persistent atrial fibrillation.   LOV 11/2020    Presents today with his daughter  8/22: traumatic fall, head injury CT head 1. Left parietal scalp laceration. 2. No acute intracranial process.  Change in speech, acute 11/'2022 CT scan Acute on subacute left subdural hematoma measuring up to 1.7 cm in maximal diameter at the left frontal convexity. Associated mild mass effect on the subjacent left cerebral hemisphere with up to 5 mm of left-to-right shift. No hydrocephalus or trapping.  CT head 03/11/21 1. Postsurgical changes from left frontotemporal craniotomy. 2. 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 and along the left frontal lobe markedly decreased in size since prior study  Weight 228 at home Chronic lower extremity edema, woody appearance  Heart rate improved today in the 60s, on last clinic visit atrial fibrillation rate in the 40s, he declined referral to EP  Lab work reviewed CR 2  BP running high We have checked patient's blood pressure cuff which is running 20-30 points higher than manual check by myself and by nursing  EKG personally reviewed by myself on todays visit Atrial fibrillation ventricular rate 63 bpm consider old anterior MI   PMH:   has a past medical history of AKI (acute kidney injury) ([place]) (07/06/2018), CAD (coronary artery disease), Cellulitis of lower extremity (05/31/2019),  Diabetes ([place]), GERD (gastroesophageal reflux disease), HLD (hyperlipidemia), and HTN (hypertension).  PSH:    Past Surgical History:  Procedure Laterality Date   BLEPHAROPLASTY     CARDIAC CATHETERIZATION     CORONARY STENT INTERVENTION     CRANIOTOMY Left 02/04/2021   Procedure: CRANIOTOMY FOR LEFT SUBDURAL  HEMATOMA EVACUATION;  Surgeon: Deetta Perla, MD;  Location: [facility] ORS;  Service: Neurosurgery;  Laterality: Left;   CYSTOSCOPY     HERNIA REPAIR      Current Outpatient Medications  Medication Sig Dispense Refill   apixaban (ELIQUIS) 2.5 MG TABS tablet Take 1 tablet (2.5 mg total) by mouth 2 (two) times daily. 60 tablet 0   Ascorbic Acid (VITAMIN C PO) Take 1 tablet by mouth in the morning.     Cholecalciferol (VITAMIN D3 PO) Take 1 tablet by mouth in the morning.     Coenzyme Q10-Vitamin E (QUNOL ULTRA COQ10 PO) Take 1 capsule by mouth in the morning.     dorzolamide-timolol (COSOPT) 22.3-6.8 MG/ML ophthalmic solution Place 1 drop into the left eye 2 (two) times daily.     Flaxseed, Linseed, (FLAXSEED OIL PO) Take 1 capsule by mouth in the morning.     hydrALAZINE (APRESOLINE) 100 MG tablet Take 1 tablet (100 mg total) by mouth 3 (three) times daily. 90 tablet 0   insulin NPH Human (NOVOLIN N) 100 UNIT/ML injection Inject 10-15 Units into the skin daily. Take 10-15 units twice a day after breakfast and before bedtime per Sliding Scale (BS>250: 15 units, BS= 200-250:10 units)     isosorbide mononitrate (IMDUR) 30 MG  24 hr tablet Take 3 tablets (90 mg total) by mouth daily. 90 tablet 0   Multiple Vitamin (MULTIVITAMIN WITH MINERALS) TABS tablet Take 1 tablet by mouth in the morning.     nitroGLYCERIN (NITROLINGUAL) 0.4 MG/SPRAY spray Place 1 spray under the tongue as directed. 12 g 0   Omega-3 Fatty Acids (FISH OIL PO) Take 1 capsule by mouth in the morning.     potassium chloride SA (KLOR-CON) 20 MEQ tablet Take 1 tablet (20 mEq total) by mouth daily as needed. (Patient taking  differently: Take 20 mEq by mouth daily as needed (with metalozone).) 90 tablet 3   rosuvastatin (CRESTOR) 5 MG tablet Take 1 tablet by mouth once daily on Tueday, Thursday and Saturday. 30 tablet 0   torsemide (DEMADEX) 20 MG tablet Take 2 tablets (40 mg total) by mouth daily. 60 tablet 0   Turmeric (QC TUMERIC COMPLEX PO) Take 1 capsule by mouth in the morning. Qunol Turmeric Curcumin     acetaminophen (TYLENOL) 325 MG tablet Take 2 tablets (650 mg total) by mouth every 4 (four) hours as needed for mild pain (temp > 100.5). (Patient not taking: Reported on 04/07/2021)     butalbital-acetaminophen-caffeine (FIORICET) 50-325-40 MG tablet Take 1 tablet by mouth every 4 (four) hours as needed for headache. (Patient not taking: Reported on 04/07/2021) 14 tablet 0   docusate sodium (COLACE) 100 MG capsule Take 1 capsule (100 mg total) by mouth daily. (Patient not taking: Reported on 04/07/2021) 10 capsule 0   insulin glargine (LANTUS) 100 UNIT/ML Solostar Pen Inject 16 Units into the skin daily. (Patient not taking: Reported on 04/07/2021) 15 mL 11   insulin lispro (HUMALOG) 100 UNIT/ML KwikPen Inject 4 Units into the skin 3 (three) times daily with meals. (Patient not taking: Reported on 04/07/2021) 12 mL 11   oxyCODONE (OXY IR/ROXICODONE) 5 MG immediate release tablet Take 1 tablet (5 mg total) by mouth every 6 (six) hours as needed for breakthrough pain. (Patient not taking: Reported on 04/07/2021) 30 tablet 0   polyethylene glycol (MIRALAX / GLYCOLAX) 17 g packet Take 17 g by mouth daily as needed for mild constipation. (Patient not taking: Reported on 04/07/2021) 14 each 0   No current facility-administered medications for this visit.    Allergies:   Atorvastatin and Simvastatin   Social History:  The patient  reports that he has quit smoking. He has never used smokeless tobacco. He reports that he does not currently use alcohol after a past usage of about 2.0 standard drinks per week. He reports that he  does not currently use drugs.   Family History:   family history includes CAD in his father; Diabetes in his brother; Pancreatic cancer in his mother.    Review of Systems: Review of Systems  Constitutional: Negative.   HENT: Negative.    Respiratory:  Positive for shortness of breath.   Cardiovascular:  Positive for leg swelling.  Gastrointestinal: Negative.   Musculoskeletal: Negative.        Legs are weak  Neurological: Negative.   Psychiatric/Behavioral: Negative.    All other systems reviewed and are negative.  PHYSICAL EXAM: VS:  BP (!) 148/54 (BP Location: Left Arm, Patient Position: Sitting, Cuff Size: Normal)    Pulse 61    Ht 5\' 8"  (1.727 m)    Wt 234 lb 6 oz (106.3 kg)    SpO2 98%    BMI 35.64 kg/m  , BMI Body mass index is 35.64 kg/m.  Constitutional:  oriented to person, place, and time. No distress.  HENT:  Head: Grossly normal Eyes:  no discharge. No scleral icterus.  Neck: No JVD, no carotid bruits  Cardiovascular: Regular rate and rhythm, no murmurs appreciated 1+ lower extremity woody swelling Pulmonary/Chest: Clear to auscultation bilaterally, no wheezes or rails Abdominal: Soft.  no distension.  no tenderness.  Musculoskeletal: Normal range of motion Neurological:  normal muscle tone. Coordination normal. No atrophy Skin: Skin warm and dry Psychiatric: normal affect, pleasant   Recent Labs: 06/18/2020: B Natriuretic Peptide 332.4 02/12/2021: ALT 15 02/24/2021: Hemoglobin 11.8; Platelets 210 02/27/2021: BUN 47; Creatinine, Ser 2.10; Potassium 3.7; Sodium 140    Lipid Panel Lab Results  Component Value Date   CHOL 123 10/18/2019   HDL 35 (L) 10/18/2019   LDLCALC 67 10/18/2019   TRIG 107 10/18/2019    Wt Readings from Last 3 Encounters:  04/07/21 234 lb 6 oz (106.3 kg)  02/27/21 220 lb 7.4 oz (100 kg)  02/04/21 230 lb (104.3 kg)     ASSESSMENT AND PLAN:  Problem List Items Addressed This Visit       Cardiology Problems   Benign essential  HTN   Essential hypertension   Chronic a-fib (HCC)   Chronic heart failure with preserved ejection fraction (HFpEF) (HCC) - Primary   Atherosclerotic heart disease of native coronary 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 ([place])   Hyperlipidemia associated with type 2 diabetes mellitus ([place])  Persistent atrial fibrillation Slow ventricular rate in the past, not on beta-blockers or calcium channel blockers Rate is better today, medical management recommended Avoid beta-blockers, calcium channel blockers On anticoagulation Eliquis 2.5 twice daily  Chronic diastolic CHF Recommend he stay on torsemide 40 daily Consider continuing metolazone once a week Weight stable, recommended leg wraps for lymphedema  Morbid obesity Unable to exercise, recommended calorie restriction  Essential hypertension Labile pressures, running much higher since his fall, head surgery, subdural hematoma, unclear if this is playing a role Recommend he increase Hydralazine 100 mg four times a day Start Cardura 1 mg once a day, may need twice daily dosing Extra hydralazine 50 mg daily as needed for pressure >170 Metolazone 5 mg as needed  for weight 235 pounds   Total encounter time more than 35 minutes  Greater than 50% was spent in counseling and coordination of care with the patient    Signed, Esmond Plants, M.D., Ph.D. [place], [place]

## 2021-04-07 NOTE — Patient Instructions (Addendum)
Medication Instructions:  Please START Cardura 1 mg twice a day  (start once a day, increase only if needed) Hydralazine 100 mg three times a day ,  extra 100 mg as needed for high pressure Extra hydralazine 50 mg daily as needed for pressure >170 Metolazone 5 mg as needed   for weight 235 pounds  If you need a refill on your cardiac medications before your next appointment, please call your pharmacy.   Lab work: No new labs needed  Testing/Procedures: No new testing needed  Follow-Up: At [facility], you and your health needs are our priority.  As part of our continuing mission to provide you with exceptional heart care, we have created designated Provider Care Teams.  These Care Teams include your primary Cardiologist (physician) and Advanced Practice Providers (APPs -  Physician Assistants and Nurse Practitioners) who all work together to provide you with the care you need, when you need it.  You will need a follow up appointment in 3 months  Providers on your designated Care Team:   Murray Hodgkins, NP Christell Faith, PA-C Cadence Kathlen Mody, [place]  COVID-19 Vaccine Information can be found at: [email or web address].uk For questions related to vaccine distribution or appointments, please email [email or web address] or call [phone].

## 2021-04-07 NOTE — Progress Notes (Deleted)
Cardiology Office Note  Date:  04/07/2021   ID:  Wagner Tanzi, DOB [birth year], MRN [id number]  PCP:  Glean Hess, MD   No chief complaint on file.   HPI:  Kyle Roberson is a 81 y.o. male with history of  coronary artery disease,  persistent atrial fibrillation,  hypertension,  hyperlipidemia,  type 2 diabetes mellitus,  chronic kidney disease,  GERD, and  hyperparathyroidism,  who presents for follow-up of chronic HFpEF and persistent atrial fibrillation.     Presents today with his daughter Recent fall at home, mechanical 8/26/222 Trauma to the back of his head, left arm, back He called 911, Seen in the emergency room Since follows had some dizziness/gait instability BP elevated at home , though family increased hydralazine up to 4 times daily with improved numbers  Presents today in wheelchair, reports he feels well Continues to have significant leg swelling No improvement with higher dose torsemide Sometimes sitting with legs down Unable to tolerate Ace wraps or compression hose, " they burn" He is interested in lymphedema evaluation, possible PT for lymphedema  Taking torsemide 40 daily, daughter helping with medication compliance Limited mobility  EKG personally reviewed by myself on todays visit Atrial fibrillation ventricular rate 43 bpm  Followed by Dr. Holley Raring CR 2.34, BUN 47, 02/2020 Up from 2.32, BUN 37  in 08/2019  Other past medical history reviewed  April 2021,  significantly volume overloaded despite escalation of outpatient diuretics.  directly admitted to the hospital and underwent extensive IV diuresis.   Right heart catheterization was discussed, but the patient ultimately declined this. 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  PSH:    Past Surgical History:  Procedure Laterality Date   BLEPHAROPLASTY     CARDIAC CATHETERIZATION     CORONARY STENT INTERVENTION     CRANIOTOMY Left 02/04/2021   Procedure: CRANIOTOMY FOR LEFT SUBDURAL  HEMATOMA EVACUATION;  Surgeon: Deetta Perla, MD;  Location: [facility] ORS;  Service: Neurosurgery;  Laterality: Left;   CYSTOSCOPY     HERNIA REPAIR      Current Outpatient Medications  Medication Sig Dispense Refill   acetaminophen (TYLENOL) 325 MG tablet Take 2 tablets (650 mg total) by mouth every 4 (four) hours as needed for mild pain (temp > 100.5).     apixaban (ELIQUIS) 2.5 MG TABS tablet Take 1 tablet (2.5 mg total) by mouth 2 (two) times daily. 60 tablet 0   Ascorbic Acid (VITAMIN C PO) Take 1 tablet by mouth in the morning.     butalbital-acetaminophen-caffeine (FIORICET) 50-325-40 MG tablet Take 1 tablet by mouth every 4 (four) hours as needed for headache. 14 tablet 0   Cholecalciferol (VITAMIN D3 PO) Take 1 tablet by mouth in the morning.     Coenzyme Q10-Vitamin E (QUNOL ULTRA COQ10 PO) Take 1 capsule by mouth in the morning.     docusate sodium (COLACE) 100 MG capsule Take 1 capsule (100 mg total) by mouth daily. 10 capsule 0   dorzolamide-timolol (COSOPT) 22.3-6.8 MG/ML ophthalmic  solution Place 1 drop into the left eye 2 (two) times daily.     Flaxseed, Linseed, (FLAXSEED OIL PO) Take 1 capsule by mouth in the morning.     hydrALAZINE (APRESOLINE) 100 MG tablet Take 1 tablet (100 mg total) by mouth 3 (three) times daily. 90 tablet 0   insulin glargine (LANTUS) 100 UNIT/ML Solostar Pen Inject 16 Units into the skin daily. 15 mL 11   insulin lispro (HUMALOG) 100  UNIT/ML KwikPen Inject 4 Units into the skin 3 (three) times daily with meals. 12 mL 11   insulin NPH Human (NOVOLIN N) 100 UNIT/ML injection Inject 10-15 Units into the skin daily. Take 10-15 units twice a day after breakfast and before bedtime per Sliding Scale (BS>250: 15 units, BS= 200-250:10 units)     isosorbide mononitrate (IMDUR) 30 MG 24 hr tablet Take 3 tablets (90 mg total) by mouth daily. 90 tablet 0   Multiple Vitamin (MULTIVITAMIN WITH MINERALS) TABS tablet Take 1 tablet by mouth in the morning.     nitroGLYCERIN (NITROLINGUAL) 0.4 MG/SPRAY spray Place 1 spray under the tongue as directed. 12 g 0   Omega-3 Fatty Acids (FISH OIL PO) Take 1 capsule by mouth in the morning.     oxyCODONE (OXY IR/ROXICODONE) 5 MG immediate release tablet Take 1 tablet (5 mg total) by mouth every 6 (six) hours as needed for breakthrough pain. 30 tablet 0   polyethylene glycol (MIRALAX / GLYCOLAX) 17 g packet Take 17 g by mouth daily as needed for mild constipation. 14 each 0   potassium chloride SA (KLOR-CON) 20 MEQ tablet Take 1 tablet (20 mEq total) by mouth daily as needed. (Patient taking differently: Take 20 mEq by mouth daily as needed (with metalozone).) 90 tablet 3   rosuvastatin (CRESTOR) 5 MG tablet Take 1 tablet by mouth once daily on Tueday, Thursday and Saturday. 30 tablet 0   torsemide (DEMADEX) 20 MG tablet Take 2 tablets (40 mg total) by mouth daily. 60 tablet 0   Turmeric (QC TUMERIC COMPLEX PO) Take 1 capsule by mouth in the morning. Qunol Turmeric Curcumin     No current facility-administered medications for this visit.    Allergies:   Atorvastatin and Simvastatin   Social History:  The patient  reports that he has quit smoking. He has never used smokeless tobacco. He reports that he does not currently use alcohol after a past usage of about 2.0 standard drinks per week. He reports that he does not currently use drugs.   Family History:   family history includes CAD in his father;  Diabetes in his brother; Pancreatic cancer in his mother.    Review of Systems: Review of Systems  Constitutional: Negative.        Weight gain  HENT: Negative.    Respiratory:  Positive for shortness of breath.   Cardiovascular:  Positive for leg swelling.  Gastrointestinal: Negative.   Musculoskeletal: Negative.        Legs are weak  Neurological: Negative.   Psychiatric/Behavioral: Negative.    All other systems reviewed and are negative.  PHYSICAL EXAM: VS:  There were no vitals taken for this visit. , BMI There is no height or weight on file to calculate BMI.  Constitutional:  oriented to person, place, and time. No distress.  HENT:  Head: Grossly normal Eyes:  no discharge. No scleral icterus.  Neck: No JVD, no carotid bruits  Cardiovascular: Regular rate and rhythm, no murmurs appreciated 2+ pitting lower extremity edema  with blistering Pulmonary/Chest: Clear to auscultation bilaterally, no wheezes or rails Abdominal: Soft.  no distension.  no tenderness.  Musculoskeletal: Normal range of motion Neurological:  normal muscle tone. Coordination normal. No atrophy Skin: Skin warm and dry Psychiatric: normal affect, pleasant  Recent Labs: 06/18/2020: B Natriuretic Peptide 332.4 02/12/2021: ALT 15 02/24/2021: Hemoglobin 11.8; Platelets 210 02/27/2021: BUN 47; Creatinine, Ser 2.10; Potassium 3.7; Sodium 140    Lipid Panel Lab Results  Component Value Date   CHOL 123 10/18/2019   HDL 35 (L) 10/18/2019   LDLCALC 67 10/18/2019   TRIG 107 10/18/2019    Wt Readings from Last 3 Encounters:  02/27/21 220 lb 7.4 oz (100 kg)  02/04/21 230 lb (104.3 kg)  02/02/21 230 lb (104.3 kg)     ASSESSMENT AND PLAN:  Problem List Items Addressed This Visit   None Persistent atrial fibrillation Slow ventricular rate, not on beta-blockers or calcium channel blockers Renally dosed Eliquis Asymptomatic despite rates in the 9s Denies orthostasis symptoms Monitor for now Long  discussion with daughter concerning indications for pacer  Referral to EP offered, he has declined at this time, rate in the low 40s, reports he is asymptomatic Unable to exclude that this is contributing to his CHF symptoms Certainly recommended if he has any orthostasis symptoms call us, a ZIO monitor was also offered he has declined at this time  Chronic diastolic CHF Recommend he stay on torsemide 40 daily Consider continuing metolazone once a week Weight is higher approximately 10 pounds Moderate fluid intake Needs assistance with lymphedema compression pumps, unable to wear compression hose and Ace wraps as they burn Having some blistering, high risk of cellulitis  Morbid obesity Recent fall, unable to exercise, recommended calorie restriction  Essential hypertension Blood pressure is well controlled on today's visit. No changes made to the medications.    Total encounter time more than 35 minutes  Greater than 50% was spent in counseling and coordination of care with the patient    Signed, Esmond Plants, M.D., Ph.D. [place], [place]

## 2021-04-08 ENCOUNTER — Telehealth: Payer: Self-pay | Admitting: Cardiovascular Disease

## 2021-04-08 ENCOUNTER — Other Ambulatory Visit: Payer: Self-pay

## 2021-04-08 ENCOUNTER — Ambulatory Visit: Payer: Medicare HMO | Admitting: Cardiovascular Disease

## 2021-04-08 MED ORDER — ISOSORBIDE MONONITRATE ER 30 MG PO TB24: 90.0000 mg | ORAL_TABLET | Freq: Every day | ORAL | 0 refills | Status: DC

## 2021-04-08 NOTE — Telephone Encounter (Signed)
isosorbide mononitrate (IMDUR) 30 MG 24 hr tablet 270 tablet 0 04/08/2021    Sig - Route: Take 3 tablets (90 mg total) by mouth daily. - Oral   Sent to pharmacy as: isosorbide mononitrate (IMDUR) 30 MG 24 hr tablet   E-Prescribing Status: Sent to pharmacy (04/08/2021  2:32 PM EST)    Pharmacy  [facility], [place] - [address]

## 2021-04-08 NOTE — Telephone Encounter (Signed)
°*  STAT* If patient is at the pharmacy, call can be transferred to refill team.   1. Which medications need to be refilled? (please list name of each medication and dose if known) isosorbide 30 mg PO TID   2. Which pharmacy/location (including street and city if local pharmacy) is medication to be sent to? [place]    3. Do they need a 30 day or 90 day supply? [place]

## 2021-04-09 ENCOUNTER — Ambulatory Visit: Payer: BC Managed Care – PPO | Admitting: Internal Medicine

## 2021-04-18 ENCOUNTER — Other Ambulatory Visit: Payer: Self-pay | Admitting: Internal Medicine

## 2021-04-18 DIAGNOSIS — E1122 Type 2 diabetes mellitus with diabetic chronic kidney disease: Secondary | ICD-10-CM

## 2021-04-18 DIAGNOSIS — I2510 Atherosclerotic heart disease of native coronary artery without angina pectoris: Secondary | ICD-10-CM

## 2021-04-18 NOTE — Telephone Encounter (Signed)
Requested medication (s) are due for refill today - no/yes  Requested medication (s) are on the active medication list -no/yes  Future visit scheduled -yes  Last refill: glipizide- no longer current on medication list                  Rosuvastatin- 02/26/21 #30  Notes to clinic: Request RF: glipizide- no longer current, rosuvastatin- outside provider  Requested Prescriptions  Pending Prescriptions Disp Refills   glipiZIDE (GLUCOTROL XL) 10 MG 24 hr tablet [Pharmacy Med Name: GLIPIZIDE ER 10MG  TABLETS] 180 tablet 0    Sig: TAKE 1 TABLET(10 MG) BY MOUTH TWICE DAILY     Endocrinology:  Diabetes - Sulfonylureas Passed - 04/18/2021 10:50 AM      Passed - HBA1C is between 0 and 7.9 and within 180 days    Hemoglobin A1C  Date Value Ref Range Status  07/22/2018 10.0  Final   Hgb A1c MFr Bld  Date Value Ref Range Status  02/05/2021 7.3 (H) 4.8 - 5.6 % Final    Comment:    (NOTE) Pre diabetes:          5.7%-6.4%  Diabetes:              >6.4%  Glycemic control for   <7.0% adults with diabetes           Passed - Valid encounter within last 6 months    Recent Outpatient Visits           5 months ago Type II diabetes mellitus with complication [facility])   [facility]   10 months ago Type II diabetes mellitus with complication [facility])   [facility]an [facility], [facility]   1 year ago Type 2 diabetes mellitus with stage 4 chronic kidney disease, with long-term current use of insulin [facility])   [facility], [facility]   1 year ago Type II diabetes mellitus with complication [facility])   [facility]   1 year ago Chronic heart failure with preserved ejection fraction (HFpEF) [facility])   [facility]an [facility], MD       Future Appointments             In 1 week Glean Hess, MD [facility], [place]   In 2 months Gollan, Kathlene November, MD [place], [facility]              rosuvastatin (CRESTOR) 5 MG tablet [Pharmacy Med Name: ROSUVASTATIN 5MG  TABLETS] 30 tablet 0    Sig: TAKE 1 TABLET BY MOUTH 3 TIMES A WEEK     Cardiovascular:  Antilipid - Statins Failed - 04/18/2021 10:50 AM      Failed - Total Cholesterol in normal range and within 360 days    Cholesterol  Date Value Ref Range Status  10/18/2019 123 0 - 200 mg/dL Final          Failed - LDL in normal range and within 360 days    LDL Cholesterol  Date Value Ref Range Status  10/18/2019 67 0 - 99 mg/dL Final    Comment:           Total Cholesterol/HDL:CHD Risk Coronary Heart Disease Risk Table                     Men   Women  1/2 Average Risk   3.4   3.3  Average  Risk       5.0   4.4  2 X Average Risk   9.6   7.1  3 X Average Risk  23.4   11.0        Use the calculated Patient Ratio above and the CHD Risk Table to determine the patient's CHD Risk.        ATP III CLASSIFICATION (LDL):  <100     mg/dL   Optimal  100-129  mg/dL   Near or Above                    Optimal  130-159  mg/dL   Borderline  160-189  mg/dL   High  >190     mg/dL   Very High Performed at [facility], [address] [place], [place] [address]           Failed - HDL in normal range and within 360 days    HDL  Date Value Ref Range Status  10/18/2019 35 (L) >40 mg/dL Final          Failed - Triglycerides in normal range and within 360 days    Triglycerides  Date Value Ref Range Status  10/18/2019 107 <150 mg/dL Final          Passed - Patient is not pregnant      Passed - Valid encounter within last 12 months    Recent Outpatient Visits           5 months ago Type II diabetes mellitus with complication [facility])   [facility]   10 months ago Type II diabetes mellitus with complication [facility])   [facility] Glean [facility], MD   1 year ago Type 2 diabetes mellitus with stage 4 chronic kidney disease, with long-term current use of insulin  [facility])   [facility] Glean [facility], MD   1 year ago Type II diabetes mellitus with complication [facility])   [facility], [facility]   1 year ago Chronic heart failure with preserved ejection fraction (HFpEF) [facility])   [facility] Glean Hess, MD       Future Appointments             In 1 week Army Melia Jesse Sans, MD [facility], [facility]   In 2 months Gollan, Kathlene November, MD [facility], [facility]               Requested Prescriptions  Pending Prescriptions Disp Refills   glipiZIDE (GLUCOTROL XL) 10 MG 24 hr tablet [Pharmacy Med Name: GLIPIZIDE ER 10MG  TABLETS] 180 tablet 0    Sig: TAKE 1 TABLET(10 MG) BY MOUTH TWICE DAILY     Endocrinology:  Diabetes - Sulfonylureas Passed - 04/18/2021 10:50 AM      Passed - HBA1C is between 0 and 7.9 and within 180 days    Hemoglobin A1C  Date Value Ref Range Status  07/22/2018 10.0  Final   Hgb A1c MFr Bld  Date Value Ref Range Status  02/05/2021 7.3 (H) 4.8 - 5.6 % Final    Comment:    (NOTE) Pre diabetes:          5.7%-6.4%  Diabetes:              >6.4%  Glycemic control for   <7.0% adults with diabetes           Passed - Valid encounter  within last 6 months    Recent Outpatient Visits           5 months ago Type II diabetes mellitus with complication [facility])   [facility]   10 months ago Type II diabetes mellitus with complication [facility])   [facility], MD   1 year ago Type 2 diabetes mellitus with stage 4 chronic kidney disease, with long-term current use of insulin [facility])   [facility] Glean [facility], MD   1 year ago Type II diabetes mellitus with complication [facility])   [facility] Gle[facility], MD   1 year ago Chronic heart failure with preserved ejection fraction (HFpEF) [facility])   [facility] Glean [facility], MD       Future Appointments             In 1  week Glean Hess, MD [facility], [place]   In 2 months Gollan, Kathlene November, MD [place], [facility]             rosuvastatin (CRESTOR) 5 MG tablet [Pharmacy Med Name: ROSUVASTATIN 5MG  TABLETS] 30 tablet 0    Sig: TAKE 1 TABLET BY MOUTH 3 TIMES A WEEK     Cardiovascular:  Antilipid - Statins Failed - 04/18/2021 10:50 AM      Failed - Total Cholesterol in normal range and within 360 days    Cholesterol  Date Value Ref Range Status  10/18/2019 123 0 - 200 mg/dL Final          Failed - LDL in normal range and within 360 days    LDL Cholesterol  Date Value Ref Range Status  10/18/2019 67 0 - 99 mg/dL Final    Comment:           Total Cholesterol/HDL:CHD Risk Coronary Heart Disease Risk Table                     Men   Women  1/2 Average Risk   3.4   3.3  Average Risk       5.0   4.4  2 X Average Risk   9.6   7.1  3 X Average Risk  23.4   11.0        Use the calculated Patient Ratio above and the CHD Risk Table to determine the patient's CHD Risk.        ATP III CLASSIFICATION (LDL):  <100     mg/dL   Optimal  100-129  mg/dL   Near or Above                    Optimal  130-159  mg/dL   Borderline  160-189  mg/dL   High  >190     mg/dL   Very High Performed at [facility], [address] [place], [place] [address]           Failed - HDL in normal range and within 360 days    HDL  Date Value Ref Range Status  10/18/2019 35 (L) >40 mg/dL Final          Failed - Triglycerides in normal range and within 360 days    Triglycerides  Date Value Ref Range Status  10/18/2019 107 <150 mg/dL Final          Passed - Patient is not pregnant      Passed -  Valid encounter within last 12 months    Recent Outpatient Visits           5 months ago Type II diabetes mellitus with complication [facility])   [facility]   10 months ago Type II diabetes mellitus with complication [facility])   [facility]  Glean Hess, MD   1 year ago Type 2 diabetes mellitus with stage 4 chronic kidney disease, with long-term current use of insulin [facility])   [facility] Gle[facility], MD   1 year ago Type II diabetes mellitus with complication [facility])   [facility]   1 year ago Chronic heart failure with preserved ejection fraction (HFpEF) [facility])   [facility] Glean Hess, MD       Future Appointments             In 1 week Army Melia Jesse Sans, MD [facility], [place]   In 2 months Gollan, Kathlene November, MD [facility], [facility]

## 2021-04-18 NOTE — Telephone Encounter (Signed)
[facility] called and spoke to [place] about the refill(s) crestor 5mg  requested. Advised it was sent on 04/18/21 #30/0 refill(s). They are requesting 90 day supply per insurance. Pt is overdue for labs and has an appt coming up on 04/29/21. Will send to provider for approval.   Requested Prescriptions  Pending Prescriptions Disp Refills   rosuvastatin (CRESTOR) 5 MG tablet [Pharmacy Med Name: ROSUVASTATIN 5MG  TABLETS] 38 tablet     Sig: TAKE 1 TABLET BY MOUTH 3 TIMES A WEEK     Cardiovascular:  Antilipid - Statins Failed - 04/18/2021  3:51 PM      Failed - Total Cholesterol in normal range and within 360 days    Cholesterol  Date Value Ref Range Status  10/18/2019 123 0 - 200 mg/dL Final          Failed - LDL in normal range and within 360 days    LDL Cholesterol  Date Value Ref Range Status  10/18/2019 67 0 - 99 mg/dL Final    Comment:           Total Cholesterol/HDL:CHD Risk Coronary Heart Disease Risk Table                     Men   Women  1/2 Average Risk   3.4   3.3  Average Risk       5.0   4.4  2 X Average Risk   9.6   7.1  3 X Average Risk  23.4   11.0        Use the calculated Patient Ratio above and the CHD Risk Table to determine the patient's CHD Risk.        ATP III CLASSIFICATION (LDL):  <100     mg/dL   Optimal  100-129  mg/dL   Near or Above                    Optimal  130-159  mg/dL   Borderline  160-189  mg/dL   High  >190     mg/dL   Very High Performed at [facility], [address] [place], [place] [address]           Failed - HDL in normal range and within 360 days    HDL  Date Value Ref Range Status  10/18/2019 35 (L) >40 mg/dL Final          Failed - Triglycerides in normal range and within 360 days    Triglycerides  Date Value Ref Range Status  10/18/2019 107 <150 mg/dL Final          Passed - Patient is not pregnant      Passed - Valid encounter within last 12 months    Recent Outpatient Visits            5 months ago Type II diabetes mellitus with complication [facility])   [facility]   10 months ago Type II diabetes mellitus with complication [facility])   [facility] Gle[facility], MD   1 year ago Type 2 diabetes mellitus with stage 4 chronic kidney disease, with long-term current use of insulin [facility]   [facility] Glean [facility], [facility]   1 year ago Type II diabetes mellitus with complication [facility])   [facility] Glean [facility], MD   1 year ago Chronic heart failure with preserved ejection fraction (HFpEF) ([place])  [facility] Glean Hess, MD       Future Appointments             In 1 week [place] Melia Jesse Sans, MD [facility], [place]   In 2 months [place], Kathlene November, MD [facility], [place]

## 2021-04-18 NOTE — Telephone Encounter (Signed)
Refill if appropriate.  Please advise.  

## 2021-04-29 ENCOUNTER — Ambulatory Visit: Payer: BC Managed Care – PPO | Admitting: Internal Medicine

## 2021-05-05 ENCOUNTER — Encounter: Payer: Self-pay | Admitting: Internal Medicine

## 2021-05-05 ENCOUNTER — Other Ambulatory Visit
Admission: RE | Admit: 2021-05-05 | Discharge: 2021-05-05 | Disposition: A | Discharge status: Home / Self Care | Payer: Medicare HMO | Attending: Internal Medicine | Admitting: Internal Medicine

## 2021-05-05 ENCOUNTER — Other Ambulatory Visit: Payer: Self-pay

## 2021-05-05 ENCOUNTER — Ambulatory Visit (INDEPENDENT_AMBULATORY_CARE_PROVIDER_SITE_OTHER): Payer: Medicare HMO | Admitting: Internal Medicine

## 2021-05-05 VITALS — BP 132/68 | HR 59 | Ht 68.0 in | Wt 244.0 lb

## 2021-05-05 DIAGNOSIS — N184 Chronic kidney disease, stage 4 (severe): Secondary | ICD-10-CM | POA: Insufficient documentation

## 2021-05-05 DIAGNOSIS — Z794 Long term (current) use of insulin: Secondary | ICD-10-CM | POA: Diagnosis not present

## 2021-05-05 DIAGNOSIS — I129 Hypertensive chronic kidney disease with stage 1 through stage 4 chronic kidney disease, or unspecified chronic kidney disease: Secondary | ICD-10-CM | POA: Insufficient documentation

## 2021-05-05 DIAGNOSIS — E1122 Type 2 diabetes mellitus with diabetic chronic kidney disease: Secondary | ICD-10-CM

## 2021-05-05 DIAGNOSIS — E1169 Type 2 diabetes mellitus with other specified complication: Secondary | ICD-10-CM | POA: Diagnosis not present

## 2021-05-05 DIAGNOSIS — I10 Essential (primary) hypertension: Secondary | ICD-10-CM | POA: Diagnosis not present

## 2021-05-05 DIAGNOSIS — E041 Nontoxic single thyroid nodule: Secondary | ICD-10-CM | POA: Insufficient documentation

## 2021-05-05 DIAGNOSIS — N2581 Secondary hyperparathyroidism of renal origin: Secondary | ICD-10-CM

## 2021-05-05 DIAGNOSIS — E785 Hyperlipidemia, unspecified: Secondary | ICD-10-CM | POA: Insufficient documentation

## 2021-05-05 LAB — CBC WITH DIFFERENTIAL/PLATELET
Abs Immature Granulocytes: 0.04 10*3/uL (ref 0.00–0.07)
Basophils Absolute: 0 10*3/uL (ref 0.0–0.1)
Basophils Relative: 1 %
Eosinophils Absolute: 0.3 10*3/uL (ref 0.0–0.5)
Eosinophils Relative: 5 %
HCT: 34.4 % — ABNORMAL LOW (ref 39.0–52.0)
Hemoglobin: 11.2 g/dL — ABNORMAL LOW (ref 13.0–17.0)
Immature Granulocytes: 1 %
Lymphocytes Relative: 14 %
Lymphs Abs: 0.9 10*3/uL (ref 0.7–4.0)
MCH: 29.2 pg (ref 26.0–34.0)
MCHC: 32.6 g/dL (ref 30.0–36.0)
MCV: 89.8 fL (ref 80.0–100.0)
Monocytes Absolute: 0.7 10*3/uL (ref 0.1–1.0)
Monocytes Relative: 11 %
Neutro Abs: 4.3 10*3/uL (ref 1.7–7.7)
Neutrophils Relative %: 68 %
Platelets: 170 10*3/uL (ref 150–400)
RBC: 3.83 MIL/uL — ABNORMAL LOW (ref 4.22–5.81)
RDW: 15.3 % (ref 11.5–15.5)
WBC: 6.2 10*3/uL (ref 4.0–10.5)
nRBC: 0 % (ref 0.0–0.2)

## 2021-05-05 LAB — LIPID PANEL
Cholesterol: 121 mg/dL (ref 0–200)
HDL: 34 mg/dL — ABNORMAL LOW (ref >40)
LDL Cholesterol: 68 mg/dL (ref 0–99)
Total CHOL/HDL Ratio: 3.6 RATIO
Triglycerides: 95 mg/dL (ref <150)
VLDL: 19 mg/dL (ref 0–40)

## 2021-05-05 LAB — COMPREHENSIVE METABOLIC PANEL
ALT: 13 U/L (ref 0–44)
AST: 14 U/L — ABNORMAL LOW (ref 15–41)
Albumin: 3.6 g/dL (ref 3.5–5.0)
Alkaline Phosphatase: 60 U/L (ref 38–126)
Anion gap: 8 (ref 5–15)
BUN: 39 mg/dL — ABNORMAL HIGH (ref 8–23)
CO2: 25 mmol/L (ref 22–32)
Calcium: 8.4 mg/dL — ABNORMAL LOW (ref 8.9–10.3)
Chloride: 102 mmol/L (ref 98–111)
Creatinine, Ser: 2.07 mg/dL — ABNORMAL HIGH (ref 0.61–1.24)
GFR, Estimated: 31 mL/min — ABNORMAL LOW (ref >60)
Glucose, Bld: 203 mg/dL — ABNORMAL HIGH (ref 70–99)
Potassium: 3.3 mmol/L — ABNORMAL LOW (ref 3.5–5.1)
Sodium: 135 mmol/L (ref 135–145)
Total Bilirubin: 0.5 mg/dL (ref 0.3–1.2)
Total Protein: 6.6 g/dL (ref 6.5–8.1)

## 2021-05-05 LAB — TSH: TSH: 2.538 u[IU]/mL (ref 0.350–4.500)

## 2021-05-05 NOTE — Progress Notes (Signed)
Date:  05/05/2021   Name:  Kyle Roberson   DOB:  March 13, 1941   MRN:  [id number]   Chief Complaint: Hypertension and Diabetes  Hypertension This is a chronic problem. The problem has been gradually improving since onset. The problem is resistant. Associated symptoms include shortness of breath. Pertinent negatives include no chest pain or headaches. Past treatments include diuretics, alpha 1 blockers and direct vasodilators. Hypertensive end-organ damage includes kidney disease, CAD/MI and CVA. Identifiable causes of hypertension include a thyroid problem.  Diabetes He presents for his follow-up diabetic visit. He has type 2 diabetes mellitus. His disease course has been fluctuating. Pertinent negatives for hypoglycemia include no dizziness, headaches or nervousness/anxiousness. Pertinent negatives for diabetes include no chest pain. Diabetic complications include a CVA. Current diabetic treatment includes insulin injections and oral agent (monotherapy) (and glipizide). He is compliant with treatment all of the time. His weight is fluctuating dramatically. He is following a generally healthy diet. He never participates in exercise. His breakfast blood glucose is taken between 7-8 am. His breakfast blood glucose range is generally 110-130 mg/dl. His bedtime blood glucose is taken between 10-11 pm. His bedtime blood glucose range is generally >200 mg/dl. An ACE inhibitor/angiotensin II receptor blocker is not being taken.  Hyperlipidemia This is a chronic problem. The problem is controlled. Associated symptoms include shortness of breath. Pertinent negatives include no chest pain. Current antihyperlipidemic treatment includes statins. The current treatment provides significant improvement of lipids.  Thyroid Problem Presents for follow-up visit. Symptoms include leg swelling and weight gain. Patient reports no anxiety, constipation or diarrhea. The symptoms have been stable. His past medical  history is significant for hyperlipidemia.   Lab Results  Component Value Date   NA 140 02/27/2021   K 3.7 02/27/2021   CO2 24 02/27/2021   GLUCOSE 162 (H) 02/27/2021   BUN 47 (H) 02/27/2021   CREATININE 2.10 (H) 02/27/2021   CALCIUM 8.8 (L) 02/27/2021   EGFR 29 (L) 07/16/2020   GFRNONAA 31 (L) 02/27/2021   Lab Results  Component Value Date   CHOL 123 10/18/2019   HDL 35 (L) 10/18/2019   LDLCALC 67 10/18/2019   TRIG 107 10/18/2019   CHOLHDL 3.5 10/18/2019   Lab Results  Component Value Date   TSH 1.568 08/02/2018   Lab Results  Component Value Date   HGBA1C 7.3 (H) 02/05/2021   Lab Results  Component Value Date   WBC 8.3 02/24/2021   HGB 11.8 (L) 02/24/2021   HCT 37.1 (L) 02/24/2021   MCV 92.1 02/24/2021   PLT 210 02/24/2021   Lab Results  Component Value Date   ALT 15 02/12/2021   AST 16 02/12/2021   ALKPHOS 54 02/12/2021   BILITOT 0.8 02/12/2021   No results found for: 25OHVITD2, 25OHVITD3, VD25OH   Review of Systems  Constitutional:  Positive for weight gain. Negative for activity change, appetite change and unexpected weight change.  Eyes:  Negative for visual disturbance.  Respiratory:  Positive for shortness of breath. Negative for cough, chest tightness and wheezing.   Cardiovascular:  Positive for leg swelling. Negative for chest pain.  Gastrointestinal:  Negative for abdominal pain, constipation and diarrhea.  Skin:  Positive for color change.  Neurological:  Negative for dizziness, light-headedness and headaches.  Psychiatric/Behavioral:  Negative for dysphoric mood and sleep disturbance. The patient is not nervous/anxious.    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/2021   Permanent atrial fibrillation (HCC)    Subdural hematoma 02/04/2021   Anasarca    Chronic a-fib (HCC)     Hyperlipidemia associated with type 2 diabetes mellitus ([place])    Acute on chronic diastolic CHF (congestive heart failure) ([place]) 07/19/2019   Abdominal swelling    SOB (shortness of breath) 05/07/2019   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 04/28/2019   CKD (chronic kidney disease) stage 4, GFR 15-29 ml/min (HCC) 04/27/2019   Chronic heart failure with preserved ejection fraction (HFpEF) ([place]) 04/27/2019   Morbid obesity ([place]) 04/27/2019   Lymphedema 03/23/2019   Erectile dysfunction 08/01/2018   History of colonic diverticulitis 08/01/2018   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   Obesity (BMI 30-39.9) 08/01/2018   Venous insufficiency 08/01/2018   Persistent atrial fibrillation ([place]) 08/01/2018   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   Essential hypertension 07/06/2018   Type 2 diabetes mellitus with stage 4 chronic kidney disease ([place]) uncontrolled with hyperglycemia, on long-term insulin  07/06/2018   Atherosclerotic heart disease of native coronary 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 ([place]) 06/05/2018   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[place]) 01/30/2017   Vitamin D deficiency 01/30/2017   Benign non-nodular prostatic hyperplasia with lower urinary tract symptoms 09/29/2014   History of MI (myocardial infarction) 06/27/2014   Nephrolithiasis 01/04/2012    Allergies  Allergen Reactions   Atorvastatin Hives, Itching and Other (See Comments)    Other reaction(s): Other (See Comments)   Simvastatin Hives, Itching and Other (See Comments)    Other reaction(s): Other (See Comments)    Past Surgical History:  Procedure Laterality Date   BLEPHAROPLASTY     CARDIAC CATHETERIZATION     CORONARY STENT INTERVENTION     CRANIOTOMY Left 02/04/2021   Procedure: CRANIOTOMY FOR LEFT SUBDURAL  HEMATOMA EVACUATION;  Surgeon: Deetta Perla, MD;  Location: [facility] ORS;  Service: Neurosurgery;  Laterality: Left;   CYSTOSCOPY     HERNIA REPAIR      Social History   Tobacco Use    Smoking status: Former   Smokeless tobacco: Never  Scientific laboratory technician Use: Never used  Substance Use Topics   Alcohol use: Not Currently    Alcohol/week: 2.0 standard drinks    Types: 2 Glasses of wine per week   Drug use: Not Currently     Medication list has been reviewed and updated.  Current Meds  Medication Sig   apixaban (ELIQUIS) 2.5 MG TABS tablet Take 1 tablet (2.5 mg total) by mouth 2 (two) times daily.   Ascorbic Acid (VITAMIN C PO) Take 1 tablet by mouth in the morning.   Cholecalciferol (VITAMIN D3 PO) Take 1 tablet by mouth in the morning.   Coenzyme Q10-Vitamin E (QUNOL ULTRA COQ10 PO) Take 1 capsule by mouth in the morning.   dorzolamide-timolol (COSOPT) 22.3-6.8 MG/ML ophthalmic solution Place 1 drop into the left eye 2 (two) times daily.   doxazosin (CARDURA) 1 MG tablet Take 1 tablet (1 mg total) by mouth 2 (two) times daily.   Flaxseed, Linseed, (FLAXSEED OIL PO) Take 1 capsule by mouth in the morning.   glipiZIDE (GLUCOTROL XL) 10 MG 24 hr tablet TAKE 1 TABLET(10 MG) BY MOUTH TWICE DAILY (Patient taking differently: Take 10 mg by mouth daily with breakfast.)   hydrALAZINE (APRESOLINE) 100 MG tablet 100 mg three times a day, with extra 100 mg as needed for high pressure.  hydrALAZINE (APRESOLINE) 50 MG tablet Take one tab as needed for elevated BP > 170   insulin NPH Human (NOVOLIN N) 100 UNIT/ML injection Inject 8-10 Units into the skin daily before breakfast. Take 10-15 units twice a day after breakfast and before bedtime per Sliding Scale (BS>250: 15 units, BS= 200-250:10 units)   isosorbide mononitrate (IMDUR) 30 MG 24 hr tablet Take 3 tablets (90 mg total) by mouth daily.   metolazone (ZAROXOLYN) 5 MG tablet Take 1 tablet (5 mg total) by mouth as needed. For dry weight >235 pounds   Multiple Vitamin (MULTIVITAMIN WITH MINERALS) TABS tablet Take 1 tablet by mouth in the morning.   nitroGLYCERIN (NITROLINGUAL) 0.4 MG/SPRAY spray Place 1 spray under the tongue as  directed.   Omega-3 Fatty Acids (FISH OIL PO) Take 1 capsule by mouth in the morning.   potassium chloride SA (KLOR-CON) 20 MEQ tablet Take 1 tablet (20 mEq total) by mouth daily as needed. (Patient taking differently: Take 20 mEq by mouth daily as needed (with metalozone).)   rosuvastatin (CRESTOR) 5 MG tablet TAKE 1 TABLET BY MOUTH 3 TIMES A WEEK   torsemide (DEMADEX) 20 MG tablet Take 2 tablets (40 mg total) by mouth daily.   Turmeric (QC TUMERIC COMPLEX PO) Take 1 capsule by mouth in the morning. Qunol Turmeric Curcumin- 40 MG   [DISCONTINUED] amoxicillin-clavulanate (AUGMENTIN) 875-125 MG tablet SMARTSIG:1 Tablet(s) By Mouth Every 12 Hours   [DISCONTINUED] hydrALAZINE (APRESOLINE) 100 MG tablet Take 1 tablet by mouth 3 (three) times daily.    PHQ 2/9 Scores 05/05/2021 06/18/2020 10/18/2019 07/19/2019  PHQ - 2 Score 0 0 0 0  PHQ- 9 Score 0 0 0 2    GAD 7 : Generalized Anxiety Score 05/05/2021 06/18/2020 10/18/2019 07/19/2019  Nervous, Anxious, on Edge 0 0 0 1  Control/stop worrying 0 0 0 0  Worry too much - different things 0 0 0 0  Trouble relaxing 0 0 0 0  Restless 0 0 0 0  Easily annoyed or irritable 0 0 0 2  Afraid - awful might happen 0 0 0 0  Total GAD 7 Score 0 0 0 3  Anxiety Difficulty Not difficult at all - Not difficult at all Not difficult at all    BP Readings from Last 3 Encounters:  05/05/21 132/68  04/07/21 (!) 148/54  02/27/21 (!) 158/53    Physical Exam Vitals and nursing note reviewed.  Constitutional:      General: He is not in acute distress.    Appearance: He is well-developed. He is obese.  HENT:     Head: Normocephalic and atraumatic.  Cardiovascular:     Rate and Rhythm: Normal rate and regular rhythm.  Pulmonary:     Effort: Pulmonary effort is normal. No respiratory distress.     Breath sounds: No wheezing or rhonchi.  Musculoskeletal:     Cervical back: Normal range of motion.     Right lower leg: Edema present.     Left lower leg: Edema present.   Lymphadenopathy:     Cervical: No cervical adenopathy.  Skin:    General: Skin is warm and dry.     Findings: No rash.  Neurological:     Mental Status: He is alert and oriented to person, place, and time. Mental status is at baseline.  Psychiatric:        Mood and Affect: Mood normal.        Behavior: Behavior normal.    Wt Readings from Last  3 Encounters:  05/05/21 244 lb (110.7 kg)  04/07/21 234 lb 6 oz (106.3 kg)  02/27/21 220 lb 7.4 oz (100 kg)    BP 132/68 (BP Location: Right Arm, Cuff Size: Large)    Pulse (!) 59    Ht 5' 8"  (1.727 m)    Wt 244 lb (110.7 kg)    SpO2 97%    BMI 37.10 kg/m   Assessment and Plan: 1. Type 2 diabetes mellitus with stage 4 chronic kidney disease, with long-term current use of insulin (HCC) Reduce insulin and glipizide to AM only. Monitor BS twice a day - work on better diet during the day. - Comprehensive metabolic panel - Hemoglobin A1c  2. Essential hypertension BP improved with hydralazine tid Imdur tid and the addition of Cardura - CBC with Differential/Platelet  3. Hyperlipidemia associated with type 2 diabetes mellitus ([place]) Tolerating statin medication without side effects at this time LDL is at goal of < 70 on current dose Continue same therapy without change at this time. - Lipid panel  4. Morbid obesity ([place]) With edema - encourage sequential compression stockings and elevation Continue torsemide 40 mg daily.  5. 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 Holley Raring - nephrology  6. Thyroid nodule Will reschedule thyroid US - TSH   Partially dictated using Editor, commissioning. Any errors are unintentional.  Halina Maidens, MD [facility]  05/05/2021

## 2021-05-06 LAB — HEMOGLOBIN A1C
Hgb A1c MFr Bld: 7.2 % — ABNORMAL HIGH (ref 4.8–5.6)
Mean Plasma Glucose: 159.94 mg/dL

## 2021-05-09 ENCOUNTER — Ambulatory Visit: Payer: Medicare HMO

## 2021-05-21 ENCOUNTER — Ambulatory Visit: Payer: Medicare HMO

## 2021-05-26 ENCOUNTER — Ambulatory Visit: Payer: Medicare HMO

## 2021-05-27 ENCOUNTER — Ambulatory Visit: Payer: Medicare HMO | Admitting: Cardiovascular Disease

## 2021-06-03 ENCOUNTER — Ambulatory Visit
Admission: RE | Admit: 2021-06-03 | Discharge: 2021-06-03 | Disposition: A | Discharge status: Home / Self Care | Payer: Medicare HMO | Source: Ambulatory Visit | Attending: Internal Medicine | Admitting: Internal Medicine

## 2021-06-03 ENCOUNTER — Other Ambulatory Visit: Payer: Self-pay

## 2021-06-03 DIAGNOSIS — E041 Nontoxic single thyroid nodule: Secondary | ICD-10-CM | POA: Diagnosis present

## 2021-06-04 ENCOUNTER — Encounter: Payer: Self-pay | Admitting: Internal Medicine

## 2021-06-04 DIAGNOSIS — E041 Nontoxic single thyroid nodule: Secondary | ICD-10-CM | POA: Insufficient documentation

## 2021-06-28 ENCOUNTER — Other Ambulatory Visit: Payer: Self-pay | Admitting: Cardiovascular Disease

## 2021-07-04 ENCOUNTER — Other Ambulatory Visit: Payer: Self-pay | Admitting: Internal Medicine

## 2021-07-04 DIAGNOSIS — I2510 Atherosclerotic heart disease of native coronary artery without angina pectoris: Secondary | ICD-10-CM

## 2021-07-04 NOTE — Telephone Encounter (Signed)
Requested Prescriptions  ?Pending Prescriptions Disp Refills  ?? rosuvastatin (CRESTOR) 5 MG tablet [Pharmacy Med Name: ROSUVASTATIN 5MG  TABLETS] 38 tablet 0  ?  Sig: TAKE 1 TABLET BY MOUTH 3 TIMES A WEEK  ?  ? Cardiovascular:  Antilipid - Statins 2 Failed - 07/04/2021 10:04 AM  ?  ?  Failed - Cr in normal range and within 360 days  ?  Creatinine, Ser  ?Date Value Ref Range Status  ?05/05/2021 2.07 (H) 0.61 - 1.24 mg/dL Final  ?   ?  ?  Failed - Lipid Panel in normal range within the last 12 months  ?  Cholesterol  ?Date Value Ref Range Status  ?05/05/2021 121 0 - 200 mg/dL Final  ? ?LDL Cholesterol  ?Date Value Ref Range Status  ?05/05/2021 68 0 - 99 mg/dL Final  ?  Comment:  ?         ?Total Cholesterol/HDL:CHD Risk ?Coronary Heart Disease Risk Table ?                    Men   Women ? 1/2 Average Risk   3.4   3.3 ? Average Risk       5.0   4.4 ? 2 X Average Risk   9.6   7.1 ? 3 X Average Risk  23.4   11.0 ?       ?Use the calculated Patient Ratio ?above and the CHD Risk Table ?to determine the patient's CHD Risk. ?       ?ATP III CLASSIFICATION (LDL): ? <100     mg/dL   Optimal ? 100-129  mg/dL   Near or Above ?                   Optimal ? 130-159  mg/dL   Borderline ? 160-189  mg/dL   High ? >190     mg/dL   Very High ?Performed at [facility], [address] [place], [place] [address] ?  ? ?HDL  ?Date Value Ref Range Status  ?05/05/2021 34 (L) >40 mg/dL Final  ? ?Triglycerides  ?Date Value Ref Range Status  ?05/05/2021 95 <150 mg/dL Final  ? ?  ?  ?  Passed - Patient is not pregnant  ?  ?  Passed - Valid encounter within last 12 months  ?  Recent Outpatient Visits   ?      ? 2 months ago Type 2 diabetes mellitus with stage 4 chronic kidney disease, with long-term current use of insulin ([place])  ? [facility] Glean Hess, MD  ? 7 months ago Type II diabetes mellitus with complication [facility])  ? [facility] Glean Hess, MD  ? 1 year ago Type II diabetes mellitus with  complication [facility])  ? [facility] Glean Hess, MD  ? 1 year ago Type 2 diabetes mellitus with stage 4 chronic kidney disease, with long-term current use of insulin ([place])  ? [facility] Glean Hess, MD  ? 1 year ago Type II diabetes mellitus with complication [facility])  ? [facility] Glean Hess, MD  ?  ?  ?Future Appointments   ?        ? In 3 days Gollan, Kathlene November, MD [facility], [facility]  ? In 2 months Army Melia Jesse Sans, MD [facility], [place]  ?  ? ?  ?  ?  ? ?

## 2021-07-07 ENCOUNTER — Ambulatory Visit: Payer: Medicare HMO | Admitting: Cardiovascular Disease

## 2021-08-01 ENCOUNTER — Telehealth: Payer: Self-pay | Admitting: Cardiovascular Disease

## 2021-08-01 ENCOUNTER — Ambulatory Visit (INDEPENDENT_AMBULATORY_CARE_PROVIDER_SITE_OTHER): Payer: Medicare HMO | Admitting: General Practice

## 2021-08-01 DIAGNOSIS — Z01810 Encounter for preprocedural cardiovascular examination: Secondary | ICD-10-CM

## 2021-08-01 NOTE — Telephone Encounter (Signed)
Preoperative team, please contact this patient and set up a phone call appointment for further cardiac evaluation.  Thank you for your help. ? ?Jossie Ng. Akshaya Toepfer NP-C ? ?  ?08/01/2021, 2:33 PM ?[place] ?[address] ?Office [phone] Fax [phone] ? ?

## 2021-08-01 NOTE — Telephone Encounter (Signed)
S/w the pt's wife and she made appt for the pt today. Urgent tele pre op appt today as procedure is 08/05/21. Med rec not done as appt schedule urgently. Consent is done.  ? ?  ?Patient Consent for Virtual Visit  ? ? ?   ? ?Kyle Roberson has provided verbal consent on 08/01/2021 for a virtual visit (video or telephone). ? ? ?CONSENT FOR VIRTUAL VISIT FOR:  Kyle Roberson  ?By participating in this virtual visit I agree to the following: ? ?I hereby voluntarily request, consent and authorize [place] and its employed or contracted physicians, physician assistants, nurse practitioners or other licensed health care professionals (the Practitioner), to provide me with telemedicine health care services (the ?Services") as deemed necessary by the treating Practitioner. I acknowledge and consent to receive the Services by the Practitioner via telemedicine. I understand that the telemedicine visit will involve communicating with the Practitioner through live audiovisual communication technology and the disclosure of certain medical information by electronic transmission. I acknowledge that I have been given the opportunity to request an in-person assessment or other available alternative prior to the telemedicine visit and am voluntarily participating in the telemedicine visit. ? ?I understand that I have the right to withhold or withdraw my consent to the use of telemedicine in the course of my care at any time, without affecting my right to future care or treatment, and that the Practitioner or I may terminate the telemedicine visit at any time. I understand that I have the right to inspect all information obtained and/or recorded in the course of the telemedicine visit and may receive copies of available information for a reasonable fee.  I understand that some of the potential risks of receiving the Services via telemedicine include:  ?Delay or interruption in medical evaluation due to technological equipment  failure or disruption; ?Information transmitted may not be sufficient (e.g. poor resolution of images) to allow for appropriate medical decision making by the Practitioner; and/or  ?In rare instances, security protocols could fail, causing a breach of personal health information. ? ?Furthermore, I acknowledge that it is my responsibility to provide information about my medical history, conditions and care that is complete and accurate to the best of my ability. I acknowledge that Practitioner's advice, recommendations, and/or decision may be based on factors not within their control, such as incomplete or inaccurate data provided by me or distortions of diagnostic images or specimens that may result from electronic transmissions. I understand that the practice of medicine is not an exact science and that Practitioner makes no warranties or guarantees regarding treatment outcomes. I acknowledge that a copy of this consent can be made available to me via my patient portal ([facility]), or I can request a printed copy by calling the office of [place].   ? ?I understand that my insurance will be billed for this visit.  ? ?I have read or had this consent read to me. ?I understand the contents of this consent, which adequately explains the benefits and risks of the Services being provided via telemedicine.  ?I have been provided ample opportunity to ask questions regarding this consent and the Services and have had my questions answered to my satisfaction. ?I give my informed consent for the services to be provided through the use of telemedicine in my medical care ? ? ? ?

## 2021-08-01 NOTE — Telephone Encounter (Signed)
S/w the pt's wife and she made appt for the pt today. Urgent tele pre op appt today as procedure is 08/05/21. Med rec not done as appt schedule urgently. Consent is done.  ?

## 2021-08-01 NOTE — Telephone Encounter (Signed)
? ?  Pre-operative Risk Assessment  ?  ?Patient Name: Kyle Roberson  ?DOB: 08-03-1940 ?MRN: [id number]  ? ?  ? ?Request for Surgical Clearance   ? ?Procedure:   ectropion repair left lower eyelid ? ?Date of Surgery:  Clearance 08/05/21                              ?   ?Surgeon:  Vickki Muff ?Surgeon's Group or Practice Name:  [facility]  ?Phone number:  [phone] ?Fax number:  [phone] ?  ?Type of Clearance Requested:   ?- Pharmacy:  Hold Apixaban (Eliquis) please advise ?  ?Type of Anesthesia:  Not Indicated ?  ?Additional requests/questions:   ? ?Signed, ?Clarisse Gouge   ?08/01/2021, 10:54 AM  ? ?

## 2021-08-01 NOTE — Telephone Encounter (Signed)
Patient with diagnosis of afib on Eliquis for anticoagulation.   ? ?Procedure: ectropion repair left lower eyelid ?Date of procedure: 08/05/21 ? ?CHA2DS2-VASc Score = 6  ?This indicates a 9.7% annual risk of stroke. ?The patient's score is based upon: ?CHF History: 1 ?HTN History: 1 ?Diabetes History: 1 ?Stroke History: 0 ?Vascular Disease History: 1 ?Age Score: 2 ?Gender Score: 0 ?  ?CrCl 76mL/min using adjusted body weight due to obesity ?Platelet count 170K ? ?Per office protocol, patient can hold Eliquis for 2 days prior to procedure.   ?

## 2021-08-01 NOTE — Progress Notes (Signed)
PT HAS BEEN CLEARED AND NOTES HAVE BEEN FAXED TO [phone] TO DR. Vickki Muff  ?

## 2021-08-01 NOTE — Progress Notes (Signed)
? ?Virtual Visit via Telephone Note  ? ?This visit type was conducted due to national recommendations for restrictions regarding the COVID-19 Pandemic (e.g. social distancing) in an effort to limit this patient's exposure and mitigate transmission in our community.  Due to his co-morbid illnesses, this patient is at least at moderate risk for complications without adequate follow up.  This format is felt to be most appropriate for this patient at this time.  The patient did not have access to video technology/had technical difficulties with video requiring transitioning to audio format only (telephone).  All issues noted in this document were discussed and addressed.  No physical exam could be performed with this format.  Please refer to the patient's chart for his  consent to telehealth for [facility]. ? ?Evaluation Performed:  Preoperative cardiovascular risk assessment ?_____________  ? ?Date:  08/01/2021  ? ?Patient ID:  Kyle Roberson, DOB 1940-11-13, MRN [id number] ?Patient Location:  ?Home ?Provider location:   ?Office ? ?Primary Care Provider:  Glean Hess, MD ?Primary Cardiologist:  Ida Rogue, MD ? ?Chief Complaint  ?  ?81 y.o. y/o male with a h/o coronary artery disease, persistent atrial fibrillation, hypertension, hyperlipidemia, who is pending ectropion repair left lower eyelid, and presents today for telephonic preoperative cardiovascular risk assessment. ? ?Past Medical History  ?  ?Past Medical History:  ?Diagnosis Date  ? AKI (acute kidney injury) ([place]) 07/06/2018  ? CAD (coronary artery disease)   ? Cellulitis of lower extremity 05/31/2019  ? Diabetes ([place])   ? GERD (gastroesophageal reflux disease)   ? HLD (hyperlipidemia)   ? HTN (hypertension)   ? ?Past Surgical History:  ?Procedure Laterality Date  ? BLEPHAROPLASTY    ? CARDIAC CATHETERIZATION    ? CORONARY STENT INTERVENTION    ? CRANIOTOMY Left 02/04/2021  ? Procedure: CRANIOTOMY FOR LEFT SUBDURAL  HEMATOMA EVACUATION;   Surgeon: Deetta Perla, MD;  Location: [facility] ORS;  Service: Neurosurgery;  Laterality: Left;  ? CYSTOSCOPY    ? HERNIA REPAIR    ? ? ?Allergies ? ?Allergies  ?Allergen Reactions  ? Atorvastatin Hives, Itching and Other (See Comments)  ?  Other reaction(s): Other (See Comments)  ? Simvastatin Hives, Itching and Other (See Comments)  ?  Other reaction(s): Other (See Comments)  ? ? ?History of Present Illness  ?  ?Kyle Roberson is a 81 y.o. male who presents via audio/video conferencing for a telehealth visit today.  Pt was last seen in cardiology clinic on 04/07/2021 by [name].  At that time Jayceion Lisenby was doing well .  The patient is now pending procedure as outlined above. Since his last visit, he is doing well from cardiac standpoint. ? ?Today he denies chest pain, shortness of breath, lower extremity edema, fatigue, palpitations, melena, hematuria, hemoptysis, diaphoresis, weakness, presyncope, syncope, orthopnea, and PND. ? ? ? ?Home Medications  ?  ?Prior to Admission medications   ?Medication Sig Start Date End Date Taking? Authorizing Provider  ?apixaban (ELIQUIS) 2.5 MG TABS tablet Take 1 tablet (2.5 mg total) by mouth 2 (two) times daily. 02/26/21   Angiulli, Lavon Paganini, PA-C  ?Ascorbic Acid (VITAMIN C PO) Take 1 tablet by mouth in the morning.    [provider]  ?Cholecalciferol (VITAMIN D3 PO) Take 1 tablet by mouth in the morning.    [provider]  ?Coenzyme Q10-Vitamin E (QUNOL ULTRA COQ10 PO) Take 1 capsule by mouth in the morning.    [provider]  ?dorzolamide-timolol (COSOPT) 22.3-6.8 MG/ML ophthalmic  solution Place 1 drop into the left eye 2 (two) times daily. 08/28/20   [provider]  ?doxazosin (CARDURA) 1 MG tablet Take 1 tablet (1 mg total) by mouth 2 (two) times daily. 04/07/21   Minna Merritts, MD  ?Flaxseed, Linseed, (FLAXSEED OIL PO) Take 1 capsule by mouth in the morning.    [provider]  ?glipiZIDE (GLUCOTROL XL) 10 MG 24 hr  tablet TAKE 1 TABLET(10 MG) BY MOUTH TWICE DAILY ?Patient taking differently: Take 10 mg by mouth daily with breakfast. 04/18/21   Glean Hess, MD  ?hydrALAZINE (APRESOLINE) 100 MG tablet 100 mg three times a day, with extra 100 mg as needed for high pressure. 04/07/21   Minna Merritts, MD  ?hydrALAZINE (APRESOLINE) 50 MG tablet Take one tab as needed for elevated BP > 170 04/07/21   Gollan, Kathlene November, MD  ?insulin NPH Human (NOVOLIN N) 100 UNIT/ML injection Inject 8-10 Units into the skin daily before breakfast. Take 10-15 units twice a day after breakfast and before bedtime per Sliding Scale (BS>250: 15 units, BS= 200-250:10 units) 11/30/17   [provider]  ?isosorbide mononitrate (IMDUR) 30 MG 24 hr tablet TAKE 3 TABLETS(90 MG) BY MOUTH DAILY 06/30/21   Minna Merritts, MD  ?metolazone (ZAROXOLYN) 5 MG tablet Take 1 tablet (5 mg total) by mouth as needed. For dry weight >235 pounds 04/07/21   Minna Merritts, MD  ?Multiple Vitamin (MULTIVITAMIN WITH MINERALS) TABS tablet Take 1 tablet by mouth in the morning.    [provider]  ?nitroGLYCERIN (NITROLINGUAL) 0.4 MG/SPRAY spray Place 1 spray under the tongue as directed. 02/26/21 03/17/22  Cathlyn Parsons, PA-C  ?Omega-3 Fatty Acids (FISH OIL PO) Take 1 capsule by mouth in the morning.    [provider]  ?potassium chloride SA (KLOR-CON) 20 MEQ tablet Take 1 tablet (20 mEq total) by mouth daily as needed. ?Patient taking differently: Take 20 mEq by mouth daily as needed (with metalozone). 06/11/20   Minna Merritts, MD  ?rosuvastatin (CRESTOR) 5 MG tablet TAKE 1 TABLET BY MOUTH 3 TIMES A WEEK 07/04/21   Glean Hess, MD  ?torsemide (DEMADEX) 20 MG tablet Take 2 tablets (40 mg total) by mouth daily. 03/01/21   Cathlyn Parsons, PA-C  ?Turmeric (QC TUMERIC COMPLEX PO) Take 1 capsule by mouth in the morning. Qunol Turmeric Curcumin- 40 MG    [provider]  ? ? ?Physical Exam  ?  ?Vital Signs:  Keyondre Hepburn  does not have vital signs available for review today. ? ?Given telephonic nature of communication, physical exam is limited. ?AAOx3. NAD. Normal affect.  Speech and respirations are unlabored. ? ?Accessory Clinical Findings  ?  ?None ? ?Assessment & Plan  ?  ?1.  Preoperative Cardiovascular Risk Assessment: ? ?  ? ?Primary Cardiologist: Ida Rogue, MD ? ?Chart reviewed as part of pre-operative protocol coverage. Given past medical history and time since last visit, based on ACC/AHA guidelines, Tashi Band would be at acceptable risk for the planned procedure without further cardiovascular testing.  ? ?Patient was advised that if she develops new symptoms prior to surgery to contact our office to arrange a follow-up appointment.  He verbalized understanding. ? ?DASI score 9.95 ? ?Patient with diagnosis of afib on Eliquis for anticoagulation.   ?  ?Procedure: ectropion repair left lower eyelid ?Date of procedure: 08/05/21 ?  ?CHA2DS2-VASc Score = 6  ?This indicates a 9.7% annual risk of stroke. ?The patient's score  is based upon: ?CHF History: 1 ?HTN History: 1 ?Diabetes History: 1 ?Stroke History: 0 ?Vascular Disease History: 1 ?Age Score: 2 ?Gender Score: 0 ?  ?CrCl 40mL/min using adjusted body weight due to obesity ?Platelet count 170K ?  ?Per office protocol, patient can hold Eliquis for 2 days prior to procedure.  ? ? ? ?A copy of this note will be routed to requesting surgeon. ? ?Time:   ?Today, I have spent 12 minutes with the patient with telehealth technology discussing medical history, symptoms, and management plan.  Prior to her phone evaluation I spent 10 minutes reviewing her past medical history and medications. ? ? ?Deberah Pelton, NP ? ?08/01/2021, 3:11 PM ? ?

## 2021-08-12 ENCOUNTER — Other Ambulatory Visit: Payer: Self-pay | Admitting: Cardiovascular Disease

## 2021-08-12 NOTE — Telephone Encounter (Signed)
Please advise for correct refill Pt verified he is taking Torsemide 20 2 tablets daily. Pt mentioned that pt's last Rx was written for Torsemide 3 tablets daily but that isn't what he has been taking. Last filled by Lauraine Rinne, PA.

## 2021-08-24 NOTE — Progress Notes (Unsigned)
Cardiology Office Note  Date:  08/25/2021   ID:  Kyle Roberson, DOB 09-17-1940, MRN [id number]  PCP:  Glean Hess, MD   Chief Complaint  Patient presents with   3 month follow up     Patient c/o fatigue, shortness of breath, bilateral LE edema  and abdominal swelling. Medications reviewed by [name].     HPI:  Kyle Roberson is a 81 y.o. male with history of  coronary artery disease,  persistent atrial fibrillation,  hypertension,  hyperlipidemia,  type 2 diabetes mellitus,  chronic kidney disease,  GERD, and  hyperparathyroidism,  who presents for follow-up of chronic HFpEF and persistent atrial fibrillation.   LOV January 2023 Seen by one of our providers Aug 01, 2021, telephone call for preoperative evaluation eye surgery  Presents today with his daughter  Weight up 30 pounds from jan 2023 Weight today 261.8 pounds Weight on his visit in January 234 pounds  Feels fluid intake is not high Reports compliance with torsemide 40 daily Has had occasional use of metolazone Daughter does not feel he has had good urine output Reports they are weighing daily Worsening leg edema, extending into his abdomen, also sacral edema, some wheezing she reports  Scheduled for eye surgery at [facility] in 1 week's time  EKG personally reviewed by myself on todays visit Atrial fibrillation with rate 67 bpm rare PVC, unable to exclude old anterior MI  Other past medical history reviewed 8/22: traumatic fall, head injury CT head 1. Left parietal scalp laceration. 2. No acute intracranial process.  Change in speech, acute 01/2021 CT scan Acute on subacute left subdural hematoma measuring up to 1.7 cm in maximal diameter at the left frontal convexity. Associated mild mass effect on the subjacent left cerebral hemisphere with up to 5 mm of left-to-right shift. No hydrocephalus or trapping.  CT head 03/11/21 1. Postsurgical changes from left frontotemporal  craniotomy. 2. 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  PSH:    Past Surgical History:  Procedure Laterality Date   BLEPHAROPLASTY     CARDIAC CATHETERIZATION     CORONARY STENT INTERVENTION     CRANIOTOMY Left 02/04/2021   Procedure: CRANIOTOMY FOR LEFT SUBDURAL  HEMATOMA EVACUATION;  Surgeon: Deetta Perla, MD;  Location: [facility] ORS;  Service: Neurosurgery;  Laterality: Left;   CYSTOSCOPY     HERNIA REPAIR      Current Outpatient Medications  Medication Sig Dispense Refill   apixaban (ELIQUIS) 2.5 MG TABS tablet Take 1 tablet (2.5 mg total) by mouth 2 (two) times daily. 60 tablet 0   Ascorbic Acid (VITAMIN C PO) Take 1 tablet by mouth in the morning.     Cholecalciferol (VITAMIN D3 PO) Take 1 tablet by mouth in the morning.     Coenzyme Q10-Vitamin E (QUNOL ULTRA COQ10 PO) Take 1 capsule by mouth in the morning.     dorzolamide-timolol (COSOPT) 22.3-6.8 MG/ML ophthalmic solution Place 1 drop into the left eye 2 (two) times daily.     doxazosin (CARDURA) 1 MG tablet Take 1 tablet (1 mg total) by mouth 2 (two) times daily. 180 tablet 3   Flaxseed, Linseed, (FLAXSEED OIL PO) Take 1 capsule by mouth  in the morning.     glipiZIDE (GLUCOTROL XL) 10 MG 24 hr tablet TAKE 1 TABLET(10 MG) BY MOUTH TWICE DAILY (Patient taking differently: Take 10 mg by mouth daily with breakfast.) 180 tablet 0   hydrALAZINE (APRESOLINE) 100 MG tablet 100 mg three times a day, with extra 100 mg as needed for high pressure. 360 tablet 3   hydrALAZINE (APRESOLINE) 50 MG tablet Take one tab as needed for elevated BP > 170 30 tablet 3   insulin NPH Human (NOVOLIN  N) 100 UNIT/ML injection Inject 8-10 Units into the skin daily before breakfast. Take 10-15 units twice a day after breakfast and before bedtime per Sliding Scale (BS>250: 15 units, BS= 200-250:10 units)     isosorbide mononitrate (IMDUR) 30 MG 24 hr tablet TAKE 3 TABLETS(90 MG) BY MOUTH DAILY 270 tablet 0   metolazone (ZAROXOLYN) 5 MG tablet Take 1 tablet (5 mg total) by mouth as needed. For dry weight >235 pounds 90 tablet 3   Multiple Vitamin (MULTIVITAMIN WITH MINERALS) TABS tablet Take 1 tablet by mouth in the morning.     nitroGLYCERIN (NITROLINGUAL) 0.4 MG/SPRAY spray Place 1 spray under the tongue as directed. 12 g 0   Omega-3 Fatty Acids (FISH OIL PO) Take 1 capsule by mouth in the morning.     potassium chloride SA (KLOR-CON) 20 MEQ tablet Take 1 tablet (20 mEq total) by mouth daily as needed. (Patient taking differently: Take 20 mEq by mouth daily as needed (with metalozone).) 90 tablet 3   rosuvastatin (CRESTOR) 5 MG tablet TAKE 1 TABLET BY MOUTH 3 TIMES A WEEK 38 tablet 0   torsemide (DEMADEX) 20 MG tablet Take 2 tablets (40 mg total) by mouth daily. 180 tablet 0   Turmeric (QC TUMERIC COMPLEX PO) Take 1 capsule by mouth in the morning. Qunol Turmeric Curcumin- 40 MG     No current facility-administered medications for this visit.    Allergies:   Atorvastatin and Simvastatin   Social History:  The patient  reports that he has quit smoking. He has never used smokeless tobacco. He reports that he does not currently use alcohol after a past usage of about 2.0 standard drinks per week. He reports that he does not currently use drugs.   Family History:   family history includes CAD in his father; Diabetes in his brother; Pancreatic cancer in his mother.    Review of Systems: Review of Systems  Constitutional: Negative.   HENT: Negative.    Respiratory:  Positive for shortness of breath.   Cardiovascular:  Positive for leg swelling.  Gastrointestinal: Negative.   Musculoskeletal:  Negative.        Legs are weak  Neurological: Negative.   Psychiatric/Behavioral: Negative.    All other systems reviewed and are negative.  PHYSICAL EXAM: VS:  BP 140/60 (BP Location: Left Arm, Patient Position: Sitting, Cuff Size: Normal)   Pulse 67   Ht 5' 8.5" (1.74 m)   Wt 261 lb 8 oz (118.6 kg)   SpO2 93%   BMI 39.18 kg/m  , BMI Body mass index is 39.18 kg/m.  Constitutional:  oriented to person, place, and time. No distress.  HENT:  Head: Grossly normal Eyes:  no discharge. No scleral icterus.  Neck: No JVD, no carotid bruits  Cardiovascular: Regular rate and rhythm, no murmurs appreciated Woody lower extremity edema extending into the thighs Pulmonary/Chest: Clear to auscultation bilaterally, no wheezes Scattered Rales, unable to exclude dullness at the bases Abdominal: Soft.  Mild distension.  no tenderness.  Musculoskeletal: Normal range of motion Neurological:  normal muscle tone. Coordination normal. No atrophy Skin: Skin warm and dry Psychiatric: normal affect, pleasant   Recent Labs: 05/05/2021: ALT 13; BUN 39; Creatinine, Ser 2.07; Hemoglobin 11.2; Platelets 170; Potassium 3.3; Sodium 135; TSH 2.538    Lipid Panel Lab Results  Component Value Date   CHOL 121 05/05/2021   HDL 34 (L) 05/05/2021   LDLCALC 68 05/05/2021   TRIG 95 05/05/2021    Wt Readings from Last 3 Encounters:  08/25/21 261 lb 8 oz (118.6 kg)  05/05/21 244 lb (110.7 kg)  04/07/21 234 lb 6 oz (106.3 kg)     ASSESSMENT AND PLAN:  Problem List Items Addressed This Visit       Cardiology Problems   Benign essential HTN   Relevant Orders   EKG 12-Lead   Essential hypertension   Relevant Orders   EKG 12-Lead   Chronic a-fib (HCC)   Relevant Orders   EKG 12-Lead   Chronic heart failure with preserved ejection fraction (HFpEF) ([place]) - Primary   Relevant Orders   EKG 98-PJAS   Basic metabolic panel   B Nat Peptide   Hyperlipidemia associated with type 2 diabetes mellitus ([place])    Relevant Orders   EKG 12-Lead   Persistent atrial fibrillation (HCC)   Venous insufficiency     Other   CKD (chronic kidney disease) stage 4, GFR 15-29 ml/min (HCC)   Morbid obesity (HCC)  Persistent atrial fibrillation Slow ventricular rate in the past, not on beta-blockers or calcium channel blockers Avoid beta-blockers, calcium channel blockers On anticoagulation Eliquis 2.5 twice daily Continues to have adequate rate control  Chronic diastolic CHF Dramatic gain of 30 pounds in the past several months since January 2023 Fluid restriction recommended, avoid takeout food We will need to increase torsemide up to 40 twice daily with metolazone 2-3 times per week until weight trending downward Baseline BMP and BNP today recommended leg wraps for lymphedema If no improvement with higher dose torsemide and metolazone, will start Lasix subcutaneous injection patch  Morbid obesity Unable to exercise, legs weak  Leg elevation when sitting  Chronic renal failure Followed by nephrology Recommend he call and make a follow-up appointment Baseline lab work today  Essential hypertension Blood pressure mildly elevated in the setting of fluid overload Hydralazine 100 mg four times a day Continue Cardura, hydralazine Extra hydralazine 50 mg daily as needed for pressure >170 Blood pressure may improve with diuresis   Total encounter time more than 40 minutes  Greater than 50% was spent in counseling and coordination of care with the patient    Signed, Esmond Plants, M.D., Ph.D. [place], [place]

## 2021-08-25 ENCOUNTER — Other Ambulatory Visit
Admission: RE | Admit: 2021-08-25 | Discharge: 2021-08-25 | Disposition: A | Discharge status: Home / Self Care | Payer: Medicare HMO | Source: Ambulatory Visit | Attending: Cardiovascular Disease | Admitting: Cardiovascular Disease

## 2021-08-25 ENCOUNTER — Encounter: Payer: Self-pay | Admitting: Cardiovascular Disease

## 2021-08-25 ENCOUNTER — Ambulatory Visit: Payer: Medicare HMO | Admitting: Cardiovascular Disease

## 2021-08-25 VITALS — BP 140/60 | HR 67 | Ht 68.5 in | Wt 261.5 lb

## 2021-08-25 DIAGNOSIS — I10 Essential (primary) hypertension: Secondary | ICD-10-CM | POA: Diagnosis not present

## 2021-08-25 DIAGNOSIS — E785 Hyperlipidemia, unspecified: Secondary | ICD-10-CM

## 2021-08-25 DIAGNOSIS — I4819 Other persistent atrial fibrillation: Secondary | ICD-10-CM

## 2021-08-25 DIAGNOSIS — I482 Chronic atrial fibrillation, unspecified: Secondary | ICD-10-CM | POA: Diagnosis not present

## 2021-08-25 DIAGNOSIS — N184 Chronic kidney disease, stage 4 (severe): Secondary | ICD-10-CM

## 2021-08-25 DIAGNOSIS — I5032 Chronic diastolic (congestive) heart failure: Secondary | ICD-10-CM | POA: Insufficient documentation

## 2021-08-25 DIAGNOSIS — E1169 Type 2 diabetes mellitus with other specified complication: Secondary | ICD-10-CM | POA: Diagnosis not present

## 2021-08-25 DIAGNOSIS — I872 Venous insufficiency (chronic) (peripheral): Secondary | ICD-10-CM

## 2021-08-25 LAB — BASIC METABOLIC PANEL
Anion gap: 6 (ref 5–15)
BUN: 46 mg/dL — ABNORMAL HIGH (ref 8–23)
CO2: 29 mmol/L (ref 22–32)
Calcium: 8.5 mg/dL — ABNORMAL LOW (ref 8.9–10.3)
Chloride: 109 mmol/L (ref 98–111)
Creatinine, Ser: 2.5 mg/dL — ABNORMAL HIGH (ref 0.61–1.24)
GFR, Estimated: 25 mL/min — ABNORMAL LOW (ref >60)
Glucose, Bld: 144 mg/dL — ABNORMAL HIGH (ref 70–99)
Potassium: 4.3 mmol/L (ref 3.5–5.1)
Sodium: 144 mmol/L (ref 135–145)

## 2021-08-25 LAB — BRAIN NATRIURETIC PEPTIDE: B Natriuretic Peptide: 185.6 pg/mL — ABNORMAL HIGH (ref 0.0–100.0)

## 2021-08-25 NOTE — Patient Instructions (Addendum)
Weight is up 30 pounds  Medication Instructions:  Torsemide 40 mg twice a day Metolazone 5 mg 3x a week  We will send furosex 80 mg SQ  If you need a refill on your cardiac medications before your next appointment, please call your pharmacy.   Lab work: BMP and BNP   Testing/Procedures: No new testing needed  Follow-Up: At [facility], you and your health needs are our priority.  As part of our continuing mission to provide you with exceptional heart care, we have created designated Provider Care Teams.  These Care Teams include your primary Cardiologist (physician) and Advanced Practice Providers (APPs -  Physician Assistants and Nurse Practitioners) who all work together to provide you with the care you need, when you need it.  You will need a follow up appointment in 1 month  Providers on your designated Care Team:   Murray Hodgkins, NP Christell Faith, PA-C Cadence Kathlen Mody, [place]  COVID-19 Vaccine Information can be found at: [email or web address] For questions related to vaccine distribution or appointments, please email [email or web address] or call [phone].

## 2021-08-27 ENCOUNTER — Other Ambulatory Visit: Payer: Self-pay | Admitting: Cardiovascular Disease

## 2021-08-27 ENCOUNTER — Telehealth: Payer: Self-pay | Admitting: Emergency Medicine

## 2021-08-27 NOTE — Telephone Encounter (Signed)
Called and spoke with patient's significant other [name]. Asked how patient is doing today.   [name] reports that as of this morning pt is down around 7 lbs and his wheezing has improved. She reports that he is urinating a lot and getting a lot of fluid off.   I advised that she would be hearing from [employer] to get delivery set up. Recommended that she call when it was delivered to report how much weight patient was down with use of torsemide and metolazone and to find out if [name] would still like them to proceed with [employer].   [name] verbalized understanding and stated that she would call with an update once [employer] was received, or if she had any further questions or concerns.

## 2021-08-27 NOTE — Telephone Encounter (Signed)
Refill Request.  

## 2021-08-27 NOTE — Telephone Encounter (Signed)
Prescription refill request for Eliquis received. Indication: Atrial Fib Last office visit: 08/25/21 Johnny Bridge MD Scr: 2.50 on 08/25/21 Age: 81 Weight: 118.6kg  Based on above findings Eliquis 2.5mg  twice daily is the appropriate dose.  Refill approved.

## 2021-08-29 ENCOUNTER — Telehealth: Payer: Self-pay | Admitting: Emergency Medicine

## 2021-08-29 DIAGNOSIS — I5032 Chronic diastolic (congestive) heart failure: Secondary | ICD-10-CM

## 2021-08-29 NOTE — Telephone Encounter (Signed)
Called patient's significant other [name], per DPR.   Reviewed results and recommendations with Ledon Snare verbalized understanding.   Lab order placed for BMP at [facility] next week. [name] states that pt is to see his PCP on 6/20, she may have lab drawn at that appointment if patient doesn't want to go to the [facility] for labs next Friday.   [name] reports that patient's weight is down to 245lb this morning and he is still making a lot of urine on the increased torsemide with metolazone.

## 2021-08-29 NOTE — Telephone Encounter (Signed)
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

## 2021-09-02 ENCOUNTER — Other Ambulatory Visit: Payer: Self-pay | Admitting: Internal Medicine

## 2021-09-02 DIAGNOSIS — E1122 Type 2 diabetes mellitus with diabetic chronic kidney disease: Secondary | ICD-10-CM

## 2021-09-02 NOTE — Telephone Encounter (Signed)
Requested Prescriptions  Pending Prescriptions Disp Refills  . glipiZIDE (GLUCOTROL XL) 10 MG 24 hr tablet [Pharmacy Med Name: GLIPIZIDE ER 10MG  TABLETS] 180 tablet 0    Sig: TAKE 1 TABLET(10 MG) BY MOUTH TWICE DAILY     Endocrinology:  Diabetes - Sulfonylureas Failed - 09/02/2021  8:52 AM      Failed - Cr in normal range and within 360 days    Creatinine, Ser  Date Value Ref Range Status  08/25/2021 2.50 (H) 0.61 - 1.24 mg/dL Final         Passed - HBA1C is between 0 and 7.9 and within 180 days    Hemoglobin A1C  Date Value Ref Range Status  07/22/2018 10.0  Final   Hgb A1c MFr Bld  Date Value Ref Range Status  05/05/2021 7.2 (H) 4.8 - 5.6 % Final    Comment:    (NOTE) Pre diabetes:          5.7%-6.4%  Diabetes:              >6.4%  Glycemic control for   <7.0% adults with diabetes          Passed - Valid encounter within last 6 months    Recent Outpatient Visits          4 months ago Type 2 diabetes mellitus with stage 4 chronic kidney disease, with long-term current use of insulin [facility])   [facility] Glean Hess, MD   9 months ago Type II diabetes mellitus with complication [facility])   [facility] Glean Hess, MD   1 year ago Type II diabetes mellitus with complication [facility])   [facility] Glean Hess, MD   1 year ago Type 2 diabetes mellitus with stage 4 chronic kidney disease, with long-term current use of insulin [facility])   [facility] Glean Hess, MD   2 years ago Type II diabetes mellitus with complication [facility])   [facility] Glean Hess, MD      Future Appointments            In 1 week Army Melia Jesse Sans, MD [facility], [facility]

## 2021-09-09 ENCOUNTER — Ambulatory Visit: Payer: BC Managed Care – PPO | Admitting: Internal Medicine

## 2021-09-29 ENCOUNTER — Telehealth: Payer: Self-pay

## 2021-09-29 NOTE — Telephone Encounter (Signed)
Prior Authorization required for doxazosin due to twice daily dosing.  PA initiated through Medicare Part D with Estill Bamberg. [phone]  PA approved from 08-30-21 through 09-29-22 Auth #[id number]

## 2021-10-05 ENCOUNTER — Other Ambulatory Visit: Payer: Self-pay | Admitting: Cardiovascular Disease

## 2021-10-06 NOTE — Telephone Encounter (Signed)
Refill - Scheduled   Kyle Roberson - Patient SO struggling to get patient to be compliant with health care visits.  Attempted to schedule appt but he will only see Gollan and needs late afternoon  Please advise for overbook asap and also fyi patient has not gotten labs ordered in june

## 2021-10-06 NOTE — Telephone Encounter (Signed)
Please schedule 1 month F/U appointment. Thank you!

## 2021-10-11 ENCOUNTER — Other Ambulatory Visit: Payer: Self-pay | Admitting: Cardiovascular Disease

## 2021-11-27 ENCOUNTER — Ambulatory Visit: Payer: Self-pay

## 2021-11-27 NOTE — Telephone Encounter (Signed)
Scheduled for 09/12.

## 2021-11-27 NOTE — Telephone Encounter (Signed)
Chief Complaint: depression Symptoms: Weight gain, increased swelling d/t not as active, fatigue, depressed, not showering as often, shaves every 4-5 weeks. Frequency: ongoing but getting worse since Jan, Feb Pertinent Negatives: Patient denies SI or HI Disposition: [] ED /[] Urgent Care (no appt availability in office) / [x] Appointment(In office/virtual)/ []  [place] [facility]/ [] Home Care/ [] Refused Recommended Disposition /[] [place] [facility]/ []  Follow-up with PCP Additional Notes: pt's sig other, [name] calling to report depression sx and behaviors to Dr. Army Melia. She doesn't want pt knowing about this call d/t pt will get very agitated and start argument because he thinks he is fine. She states he had BI in Nov with craniotomy and went to IP rehab at [facility]. Was "normal" when he came home and was showering and shaving like hisself and walking laps around dining room to stay active and now he doesn't shower daily, only shaves every 4-5 weeks, doesn't change clothes daily. If she talks to him about needing to take better care of himself he gets agitated. She is just concerned and feels like her role has changed from sig other to nurse pretty much because she is doing everything for him. She is asking If he can be placed on low dose antidepressant to see if that will help him. I offered to move appt from 12/02/21 but she refused. She said that it would throw a red flag to [facility] and cause agitation. She also doesn't want him knowing about this conversation or medication as well. I advised her that sx he is having are typical signs of BI and being supportive and caring is most important but that I would send to Dr. Army Melia and see what her recommendations were on starting antidepressant.   Reason for Disposition  [1] Depression AND [2] worsening (e.g., sleeping poorly, less able to do activities of daily living)  Answer Assessment - Initial Assessment Questions 1. CONCERN: "What happened that made  you call today?"     Wife is concerned about pt's change in attitude, behaviors 2. DEPRESSION SYMPTOM SCREENING: "How are you feeling overall?" (e.g., decreased energy, increased sleeping or difficulty sleeping, difficulty concentrating, feelings of sadness, guilt, hopelessness, or worthlessness)     Not caring for self like he used to, not as active as was,  3. RISK OF HARM - SUICIDAL IDEATION:  "Do you ever have thoughts of hurting or killing yourself?"  (e.g., yes, no, no but preoccupation with thoughts about death)   - INTENT:  "Do you have thoughts of hurting or killing yourself right NOW?" (e.g., yes, no, N/A)   - PLAN: "Do you have a specific plan for how you would do this?" (e.g., gun, knife, overdose, no plan, N/A)     no 4. RISK OF HARM - HOMICIDAL IDEATION:  "Do you ever have thoughts of hurting or killing someone else?"  (e.g., yes, no, no but preoccupation with thoughts about death)   - INTENT:  "Do you have thoughts of hurting or killing someone right NOW?" (e.g., yes, no, N/A)   - PLAN: "Do you have a specific plan for how you would do this?" (e.g., gun, knife, no plan, N/A)      no 5. FUNCTIONAL IMPAIRMENT: "How have things been going for you overall? Have you had more difficulty than usual doing your normal daily activities?"  (e.g., better, same, worse; self-care, school, work, interactions)     worse 6. SUPPORT: "Who is with you now?" "Who do you live with?" "Do you have family or friends  who you can talk to?"      Significant other, friends and family.  8. STRESSORS: "Has there been any new stress or recent changes in your life?"     Had head injury in Nov, [name] has seen changes since Jan- Feb 10. OTHER: "Do you have any other physical symptoms right now?" (e.g., fever)       Weight gain, increased swelling d/t not as active, fatigue, depressed, not showering as often, shaves every 4-5 weeks.  Protocols used: Depression-A-AH

## 2021-12-02 ENCOUNTER — Encounter: Payer: Self-pay | Admitting: Internal Medicine

## 2021-12-02 ENCOUNTER — Ambulatory Visit (INDEPENDENT_AMBULATORY_CARE_PROVIDER_SITE_OTHER): Payer: Medicare HMO | Admitting: Internal Medicine

## 2021-12-02 ENCOUNTER — Other Ambulatory Visit
Admission: RE | Admit: 2021-12-02 | Discharge: 2021-12-02 | Disposition: A | Discharge status: Home / Self Care | Payer: Medicare HMO | Attending: Internal Medicine | Admitting: Internal Medicine

## 2021-12-02 VITALS — BP 138/74 | HR 59 | Ht 68.5 in | Wt 260.0 lb

## 2021-12-02 DIAGNOSIS — I5033 Acute on chronic diastolic (congestive) heart failure: Secondary | ICD-10-CM | POA: Diagnosis not present

## 2021-12-02 DIAGNOSIS — E118 Type 2 diabetes mellitus with unspecified complications: Secondary | ICD-10-CM | POA: Diagnosis present

## 2021-12-02 DIAGNOSIS — E785 Hyperlipidemia, unspecified: Secondary | ICD-10-CM

## 2021-12-02 DIAGNOSIS — Z8679 Personal history of other diseases of the circulatory system: Secondary | ICD-10-CM

## 2021-12-02 DIAGNOSIS — I2510 Atherosclerotic heart disease of native coronary artery without angina pectoris: Secondary | ICD-10-CM

## 2021-12-02 DIAGNOSIS — F39 Unspecified mood [affective] disorder: Secondary | ICD-10-CM

## 2021-12-02 DIAGNOSIS — N184 Chronic kidney disease, stage 4 (severe): Secondary | ICD-10-CM | POA: Insufficient documentation

## 2021-12-02 DIAGNOSIS — H02105 Unspecified ectropion of left lower eyelid: Secondary | ICD-10-CM

## 2021-12-02 DIAGNOSIS — E1169 Type 2 diabetes mellitus with other specified complication: Secondary | ICD-10-CM

## 2021-12-02 LAB — COMPREHENSIVE METABOLIC PANEL
ALT: 16 U/L (ref 0–44)
AST: 17 U/L (ref 15–41)
Albumin: 3.7 g/dL (ref 3.5–5.0)
Alkaline Phosphatase: 68 U/L (ref 38–126)
Anion gap: 9 (ref 5–15)
BUN: 51 mg/dL — ABNORMAL HIGH (ref 8–23)
CO2: 30 mmol/L (ref 22–32)
Calcium: 9.1 mg/dL (ref 8.9–10.3)
Chloride: 102 mmol/L (ref 98–111)
Creatinine, Ser: 2.69 mg/dL — ABNORMAL HIGH (ref 0.61–1.24)
GFR, Estimated: 23 mL/min — ABNORMAL LOW (ref >60)
Glucose, Bld: 117 mg/dL — ABNORMAL HIGH (ref 70–99)
Potassium: 4 mmol/L (ref 3.5–5.1)
Sodium: 141 mmol/L (ref 135–145)
Total Bilirubin: 1 mg/dL (ref 0.3–1.2)
Total Protein: 6.8 g/dL (ref 6.5–8.1)

## 2021-12-02 LAB — CBC WITH DIFFERENTIAL/PLATELET
Abs Immature Granulocytes: 0.02 10*3/uL (ref 0.00–0.07)
Basophils Absolute: 0 10*3/uL (ref 0.0–0.1)
Basophils Relative: 1 %
Eosinophils Absolute: 0.3 10*3/uL (ref 0.0–0.5)
Eosinophils Relative: 5 %
HCT: 36 % — ABNORMAL LOW (ref 39.0–52.0)
Hemoglobin: 11.2 g/dL — ABNORMAL LOW (ref 13.0–17.0)
Immature Granulocytes: 0 %
Lymphocytes Relative: 10 %
Lymphs Abs: 0.6 10*3/uL — ABNORMAL LOW (ref 0.7–4.0)
MCH: 29 pg (ref 26.0–34.0)
MCHC: 31.1 g/dL (ref 30.0–36.0)
MCV: 93.3 fL (ref 80.0–100.0)
Monocytes Absolute: 0.7 10*3/uL (ref 0.1–1.0)
Monocytes Relative: 12 %
Neutro Abs: 4.3 10*3/uL (ref 1.7–7.7)
Neutrophils Relative %: 72 %
Platelets: 158 10*3/uL (ref 150–400)
RBC: 3.86 MIL/uL — ABNORMAL LOW (ref 4.22–5.81)
RDW: 15.5 % (ref 11.5–15.5)
WBC: 5.9 10*3/uL (ref 4.0–10.5)
nRBC: 0 % (ref 0.0–0.2)

## 2021-12-02 LAB — HEMOGLOBIN A1C
Hgb A1c MFr Bld: 7.7 % — ABNORMAL HIGH (ref 4.8–5.6)
Mean Plasma Glucose: 174.29 mg/dL

## 2021-12-02 LAB — BRAIN NATRIURETIC PEPTIDE: B Natriuretic Peptide: 260.5 pg/mL — ABNORMAL HIGH (ref 0.0–100.0)

## 2021-12-02 MED ORDER — TORSEMIDE 20 MG PO TABS: ORAL_TABLET | ORAL | 0 refills | Status: DC

## 2021-12-02 MED ORDER — ROSUVASTATIN CALCIUM 5 MG PO TABS: 5.0000 mg | ORAL_TABLET | ORAL | 1 refills | Status: DC

## 2021-12-02 MED ORDER — GLIPIZIDE ER 10 MG PO TB24: ORAL_TABLET | ORAL | 1 refills | Status: DC

## 2021-12-02 MED ORDER — SERTRALINE HCL 25 MG PO TABS: 25.0000 mg | ORAL_TABLET | Freq: Every day | ORAL | 0 refills | Status: DC

## 2021-12-02 MED ORDER — NEOMYCIN-POLYMYXIN-DEXAMETH 0.1 % OP OINT: 1.0000 | TOPICAL_OINTMENT | Freq: Three times a day (TID) | OPHTHALMIC | 2 refills | Status: DC

## 2021-12-02 NOTE — Progress Notes (Signed)
Date:  12/02/2021   Name:  Kyle Roberson   DOB:  1940/09/14   MRN:  [id number]   Chief Complaint: Diabetes  Diabetes He presents for his follow-up diabetic visit. He has type 2 diabetes mellitus. His disease course has been stable. Pertinent negatives for hypoglycemia include no dizziness, headaches, nervousness/anxiousness or speech difficulty. Pertinent negatives for diabetes include no chest pain, no fatigue and no weakness. Current diabetic treatment includes oral agent (monotherapy) and insulin injections. He is compliant with treatment all of the time.  CHF - holding more fluid.  On Metaxalone and Lasix.  Also has lasix SQ home infusion but has not used it yet.  [name] is concerned about where to place it. CKD - stage 4.  Not seeing Nephrology recently.  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  He denies being depressed.  [name] says that he is not aggressive or irritable, more apathetic. 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  Now turned out again and is irritated.  Previous ocular plastic surgeon wants to do surgery under general anesthesia when the first one was done in the office with local.  [name] is trying to find someone else - possibly in [place] - as they have lost faith in the surgeon.  Lab Results  Component Value Date   NA 144 08/25/2021   K 4.3 08/25/2021   CO2 29 08/25/2021   GLUCOSE 144 (H) 08/25/2021   BUN 46 (H) 08/25/2021   CREATININE 2.50 (H) 08/25/2021   CALCIUM 8.5 (L) 08/25/2021   EGFR 29 (L) 07/16/2020   GFRNONAA 25 (L) 08/25/2021   Lab Results  Component Value Date   CHOL 121 05/05/2021   HDL 34 (L) 05/05/2021   LDLCALC 68 05/05/2021   TRIG 95 05/05/2021   CHOLHDL 3.6 05/05/2021   Lab Results  Component Value Date   TSH 2.538 05/05/2021   Lab Results  Component Value Date   HGBA1C 7.2 (H) 05/05/2021   Lab Results  Component Value Date    WBC 6.2 05/05/2021   HGB 11.2 (L) 05/05/2021   HCT 34.4 (L) 05/05/2021   MCV 89.8 05/05/2021   PLT 170 05/05/2021   Lab Results  Component Value Date   ALT 13 05/05/2021   AST 14 (L) 05/05/2021   ALKPHOS 60 05/05/2021   BILITOT 0.5 05/05/2021   No results found for: "25OHVITD2", "25OHVITD3", "VD25OH"   Review of Systems  Constitutional:  Positive for unexpected weight change. Negative for appetite change, chills, diaphoresis and fatigue.  HENT:  Negative for trouble swallowing.   Respiratory:  Negative for chest tightness, shortness of breath and wheezing.   Cardiovascular:  Positive for leg swelling (chronic stasis changes). Negative for chest pain.  Gastrointestinal:  Positive for abdominal distention. Negative for abdominal pain.  Musculoskeletal:  Positive for arthralgias, gait problem and myalgias.  Neurological:  Negative for dizziness, speech difficulty, weakness, light-headedness and headaches.  Psychiatric/Behavioral:  Negative for dysphoric mood and sleep disturbance. The patient is not nervous/anxious.     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   Hyperlipidemia associated with type 2 diabetes mellitus ([place])    Acute on chronic diastolic CHF (congestive heart failure) ([place]) 07/19/2019   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 04/28/2019   CKD (chronic  kidney disease) stage 4, GFR 15-29 ml/min (HCC) 04/27/2019   Chronic heart failure with preserved ejection fraction (HFpEF) ([place]) 04/27/2019   Morbid obesity ([place]) 04/27/2019   Lymphedema 03/23/2019   Erectile dysfunction 08/01/2018   History of colonic diverticulitis 08/01/2018   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   Venous insufficiency 08/01/2018   Persistent atrial fibrillation ([place]) 08/01/2018   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   Essential hypertension 07/06/2018   Type II  diabetes mellitus with complication ([place]) 35/45/6256   Atherosclerotic heart disease of native coronary 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 ([place]) 06/05/2018   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   Vitamin D deficiency 01/30/2017   Benign non-nodular prostatic hyperplasia with lower urinary tract symptoms 09/29/2014   Nephrolithiasis 01/04/2012    Allergies  Allergen Reactions   Atorvastatin Hives, Itching and Other (See Comments)    Other reaction(s): Other (See Comments)   Simvastatin Hives, Itching and Other (See Comments)    Other reaction(s): Other (See Comments)    Past Surgical History:  Procedure Laterality Date   BLEPHAROPLASTY     CARDIAC CATHETERIZATION     CORONARY STENT INTERVENTION     CRANIOTOMY Left 02/04/2021   Procedure: CRANIOTOMY FOR LEFT SUBDURAL  HEMATOMA EVACUATION;  Surgeon: Deetta Perla, MD;  Location: [facility] ORS;  Service: Neurosurgery;  Laterality: Left;   CYSTOSCOPY     HERNIA REPAIR      Social History   Tobacco Use   Smoking status: Former   Smokeless tobacco: Never  Scientific laboratory technician Use: Never used  Substance Use Topics   Alcohol use: Not Currently    Alcohol/week: 2.0 standard drinks of alcohol    Types: 2 Glasses of wine per week   Drug use: Not Currently     Medication list has been reviewed and updated.  Current Meds  Medication Sig   apixaban (ELIQUIS) 2.5 MG TABS tablet TAKE 1 TABLET(2.5 MG) BY MOUTH TWICE DAILY   Ascorbic Acid (VITAMIN C PO) Take 1 tablet by mouth in the morning.   Cholecalciferol (VITAMIN D3 PO) Take 1 tablet by mouth in the morning.   Coenzyme Q10-Vitamin E (QUNOL ULTRA COQ10 PO) Take 1 capsule by mouth in the morning.   dorzolamide-timolol (COSOPT) 22.3-6.8 MG/ML ophthalmic solution Place 1 drop into the left eye 2 (two) times daily.   doxazosin (CARDURA) 1 MG tablet Take 1 tablet (1 mg total) by mouth 2 (two) times daily.   Flaxseed, Linseed, (FLAXSEED OIL PO) Take 1 capsule by mouth in  the morning.   glipiZIDE (GLUCOTROL XL) 10 MG 24 hr tablet TAKE 1 TABLET(10 MG) BY MOUTH TWICE DAILY   hydrALAZINE (APRESOLINE) 100 MG tablet 100 mg three times a day, with extra 100 mg as needed for high pressure.   hydrALAZINE (APRESOLINE) 50 MG tablet Take one tab as needed for elevated BP > 170   insulin NPH Human (NOVOLIN N) 100 UNIT/ML injection Inject 8-10 Units into the skin daily before breakfast. Take 10-15 units twice a day after breakfast and before bedtime per Sliding Scale (BS>250: 15 units, BS= 200-250:10 units)   metolazone (ZAROXOLYN) 5 MG tablet Take 1 tablet (5 mg total) by mouth as needed. For dry weight >235 pounds   Multiple Vitamin (MULTIVITAMIN WITH MINERALS) TABS tablet Take 1 tablet by mouth in the morning.   neomycin-polymyxin-dexameth (MAXITROL) 0.1 % OINT 1 Application.   nitroGLYCERIN (NITROLINGUAL) 0.4 MG/SPRAY spray Place 1 spray under the tongue as directed.   Omega-3 Fatty Acids (FISH OIL  PO) Take 1 capsule by mouth in the morning.   potassium chloride SA (KLOR-CON) 20 MEQ tablet Take 1 tablet (20 mEq total) by mouth daily as needed. (Patient taking differently: Take 20 mEq by mouth daily as needed (with metalozone).)   rosuvastatin (CRESTOR) 5 MG tablet TAKE 1 TABLET BY MOUTH 3 TIMES A WEEK   torsemide (DEMADEX) 20 MG tablet TAKE 2 TABLETS(40 MG) BY MOUTH DAILY   Turmeric (QC TUMERIC COMPLEX PO) Take 1 capsule by mouth in the morning. Qunol Turmeric Curcumin- 40 MG       12/02/2021    3:57 PM 05/05/2021    3:59 PM 06/18/2020   10:30 AM 10/18/2019   10:07 AM  GAD 7 : Generalized Anxiety Score  Nervous, Anxious, on Edge 0 0 0 0  Control/stop worrying 0 0 0 0  Worry too much - different things 0 0 0 0  Trouble relaxing 0 0 0 0  Restless 0 0 0 0  Easily annoyed or irritable 1 0 0 0  Afraid - awful might happen 0 0 0 0  Total GAD 7 Score 1 0 0 0  Anxiety Difficulty Somewhat difficult Not difficult at all  Not difficult at all       12/02/2021    3:57 PM  05/05/2021    3:59 PM 06/18/2020   10:30 AM  Depression screen PHQ 2/9  Decreased Interest 1 0 0  Down, Depressed, Hopeless 1 0 0  PHQ - 2 Score 2 0 0  Altered sleeping 0 0 0  Tired, decreased energy 2 0 0  Change in appetite 1 0 0  Feeling bad or failure about yourself  1 0 0  Trouble concentrating 1 0 0  Moving slowly or fidgety/restless 0 0 0  Suicidal thoughts 0 0 0  PHQ-9 Score 7 0 0  Difficult doing work/chores Very difficult Not difficult at all     BP Readings from Last 3 Encounters:  12/02/21 138/74  08/25/21 140/60  05/05/21 132/68    Physical Exam Vitals and nursing note reviewed.  Constitutional:      General: He is not in acute distress.    Appearance: He is well-developed.  HENT:     Head: Normocephalic and atraumatic.  Cardiovascular:     Rate and Rhythm: Regular rhythm. Bradycardia present.  Pulmonary:     Effort: Pulmonary effort is normal. No respiratory distress.     Breath sounds: No wheezing or rhonchi.  Abdominal:     General: There is distension.     Comments: edema  Musculoskeletal:        General: Swelling present.     Cervical back: Normal range of motion.     Right lower leg: Edema present.     Left lower leg: Edema present.  Lymphadenopathy:     Cervical: No cervical adenopathy.  Skin:    General: Skin is warm and dry.     Capillary Refill: Capillary refill takes less than 2 seconds.     Findings: No rash.  Neurological:     Mental Status: He is alert and oriented to person, place, and time.  Psychiatric:        Mood and Affect: Mood normal.        Behavior: Behavior normal.     Wt Readings from Last 3 Encounters:  12/02/21 260 lb (117.9 kg)  08/25/21 261 lb 8 oz (118.6 kg)  05/05/21 244 lb (110.7 kg)    BP 138/74   Pulse Marland Kitchen)  59   Ht 5' 8.5" (1.74 m)   Wt 260 lb (117.9 kg)   SpO2 94%   BMI 38.96 kg/m   Assessment and Plan: 1. Type II diabetes mellitus with complication (HCC) Continue current medication Will check  labs and advise - Comprehensive metabolic panel - Hemoglobin A1c - glipiZIDE (GLUCOTROL XL) 10 MG 24 hr tablet; TAKE 1 TABLET(10 MG) BY MOUTH TWICE DAILY  Dispense: 180 tablet; Refill: 1  2. CKD (chronic kidney disease) stage 4, GFR 15-29 ml/min (HCC) - CBC with Differential/Platelet - Comprehensive metabolic panel  3. 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 He does not seem bothered by these symptoms but [name] is very concerned. Will try Sertraline 25 mg daily Encourage him to follow up with Neurology  4. Coronary artery disease involving native coronary artery of native heart without angina pectoris Continue current medications and statin - rosuvastatin (CRESTOR) 5 MG tablet; Take 1 tablet (5 mg total) by mouth 3 (three) times a week.  Dispense: 38 tablet; Refill: 1  5. Acute on chronic diastolic CHF (congestive heart failure) ([place]) Encourage him to use the Furoscix to help with diuresis - he is at risk for acute exacerbation and hospitalization. Will check labs and BNP - torsemide (DEMADEX) 20 MG tablet; TAKE 2 TABLETS(40 MG) BY MOUTH DAILY  Dispense: 180 tablet; Refill: 0 - Brain natriuretic peptide  6. Hyperlipidemia associated with type 2 diabetes mellitus (HCC) - rosuvastatin (CRESTOR) 5 MG tablet; Take 1 tablet (5 mg total) by mouth 3 (three) times a week.  Dispense: 38 tablet; Refill: 1  7. Mood disorder [facility]) Encourage him to remain as active as possible. Begin Sertraline and re-evaluate in 2 months - sertraline (ZOLOFT) 25 MG tablet; Take 1 tablet (25 mg total) by mouth daily.  Dispense: 90 tablet; Refill: 0  8. Ectropion of left lower eyelid, unspecified ectropion type [name] will find a surgeon to which he would like to be referred and then let me know. Refilled Maxitrol eye drops.   Partially dictated using Editor, commissioning. Any errors are unintentional.  Halina Maidens, MD [facility]  12/02/2021

## 2021-12-10 ENCOUNTER — Telehealth: Payer: Self-pay | Admitting: Internal Medicine

## 2021-12-10 NOTE — Telephone Encounter (Signed)
Copied from [place] [id number]. Topic: Medicare AWV >> Dec 10, 2021 10:47 AM Jae Dire wrote: Reason for CRM:  Left message for patient to call back and schedule Medicare Annual Wellness Visit (AWV) in office.   If unable to come into the office for AWV,  please offer to do virtually or by telephone.  Last AWV: 06/22/2015  Please schedule at anytime with [facility] Health Advisor.      30 minute appointment for Virtual or phone 45 minute appointment for in office or Initial virtual/phone  Any questions, please call me at [phone]

## 2021-12-10 NOTE — Telephone Encounter (Signed)
Spoke w/[name].  She explained that [facility] is not interested in completing AWV with the [facility].   He might would complete at OV with Dr Army Melia.

## 2021-12-10 NOTE — Telephone Encounter (Signed)
[name] returned Kyle Roberson, [place] call requesting a call back.  Please advise.

## 2022-01-04 NOTE — Progress Notes (Unsigned)
Cardiology Office Note  Date:  01/05/2022   ID:  Kyle Roberson, DOB [birth year], MRN [id number]  PCP:  Kyle Hess, MD   Chief Complaint  Patient presents with   Follow-up    Patient c/o shortness of breath with over exertion, bilateral LE edema and abdominal swelling. Medications reviewed by the patient verbally.     HPI:  Kyle Roberson is a 81 y.o. male with history of  coronary artery disease,  persistent atrial fibrillation,  hypertension,  hyperlipidemia,  type 2 diabetes mellitus,  chronic kidney disease, creatinine greater than 2 GERD, and  hyperparathyroidism,  Chronic diastolic CHF/pulmonary hypertension who presents for follow-up of chronic HFpEF and persistent atrial fibrillation.   LOV 6/23 Presents today with his daughter She reports that she had difficulty getting him into the car today, legs would not bend as they are very swollen He has had continued leg swelling, abdominal distention She reports that he does " fine" in the daytime On discussion of his fluid intake, he reports having high-volume coffee, up to 5 bottles of water.  She did not appreciate his volume intake Continues to take torsemide 40 daily, despite this with worsening leg swelling Daughter reports that they previously tried for 06 and it seemed to work for him but without the patch, fluid came back Reluctant to go to the hospital for inpatient care and IV Lasix  Did not stand up to be weighed today, Weight at home 263,  Can not get shoes on secondary to swelling  Lab work reviewed  A1c 7.7 BNP 260 Creatinine 2.69 BUN 51  Echocardiogram 2021 ejection fraction 55 to 60%  EKG personally reviewed by myself on todays visit Atrial fibrillation with rate 67 bpm unable to exclude old anterior MI  Other past medical history reviewed 8/22: traumatic fall, head injury CT head 1. Left parietal scalp laceration. 2. No acute intracranial process.  Change in speech, acute 01/2021 CT  scan Acute on subacute left subdural hematoma measuring up to 1.7 cm in maximal diameter at the left frontal convexity. Associated mild mass effect on the subjacent left cerebral hemisphere with up to 5 mm of left-to-right shift. No hydrocephalus or trapping.  CT head 03/11/21 1. Postsurgical changes from left frontotemporal craniotomy. 2. 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  PSH:    Past Surgical History:  Procedure Laterality Date   BLEPHAROPLASTY     CARDIAC CATHETERIZATION     CORONARY STENT INTERVENTION     CRANIOTOMY Left 02/04/2021   Procedure: CRANIOTOMY FOR LEFT SUBDURAL  HEMATOMA EVACUATION;  Surgeon: Deetta Perla, MD;  Location: [facility] ORS;  Service: Neurosurgery;  Laterality: Left;   CYSTOSCOPY     HERNIA REPAIR      Current Outpatient Medications  Medication Sig Dispense Refill   apixaban (ELIQUIS) 2.5 MG TABS tablet TAKE 1 TABLET(2.5 MG) BY MOUTH TWICE DAILY 180 tablet 1   Ascorbic Acid (VITAMIN C PO) Take 1 tablet by mouth in the morning.     Cholecalciferol (VITAMIN D3 PO) Take 1 tablet by mouth in the morning.     Coenzyme Q10-Vitamin E (QUNOL ULTRA COQ10 PO) Take 1 capsule by mouth  in the morning.     dorzolamide-timolol (COSOPT) 22.3-6.8 MG/ML ophthalmic solution Place 1 drop into the left eye 2 (two) times daily.     doxazosin (CARDURA) 1 MG tablet Take 1 tablet (1 mg total) by mouth 2 (two) times daily. 180 tablet 3   Flaxseed, Linseed, (FLAXSEED OIL PO) Take 1 capsule by mouth in the morning.     Furosemide (FUROSCIX) 80 MG/10ML CTKT Inject 80 mg into the  skin as needed.     glipiZIDE (GLUCOTROL XL) 10 MG 24 hr tablet TAKE 1 TABLET(10 MG) BY MOUTH TWICE DAILY 180 tablet 1   hydrALAZINE (APRESOLINE) 100 MG tablet 100 mg three times a day, with extra 100 mg as needed for high pressure. 360 tablet 3   hydrALAZINE (APRESOLINE) 50 MG tablet Take one tab as needed for elevated BP > 170 30 tablet 3   insulin NPH Human (NOVOLIN N) 100 UNIT/ML injection Inject 8-10 Units into the skin daily before breakfast. Take 10-15 units twice a day after breakfast and before bedtime per Sliding Scale (BS>250: 15 units, BS= 200-250:10 units)     isosorbide mononitrate (IMDUR) 30 MG 24 hr tablet Take 90 mg by mouth daily.     metolazone (ZAROXOLYN) 5 MG tablet Take 1 tablet (5 mg total) by mouth as needed. For dry weight >235 pounds 90 tablet 3   Multiple Vitamin (MULTIVITAMIN WITH MINERALS) TABS tablet Take 1 tablet by mouth in the morning.     neomycin-polymyxin-dexameth (MAXITROL) 0.1 % OINT Place 1 Application into the right eye 3 (three) times daily. 3.5 g 2   nitroGLYCERIN (NITROLINGUAL) 0.4 MG/SPRAY spray Place 1 spray under the tongue as directed. 12 g 0   Omega-3 Fatty Acids (FISH OIL PO) Take 1 capsule by mouth in the morning.     potassium chloride SA (KLOR-CON) 20 MEQ tablet Take 1 tablet (20 mEq total) by mouth daily as needed. (Patient taking differently: Take 20 mEq by mouth daily as needed (with metalozone).) 90 tablet 3   rosuvastatin (CRESTOR) 5 MG tablet Take 1 tablet (5 mg total) by mouth 3 (three) times a week. 38 tablet 1   sertraline (ZOLOFT) 25 MG tablet Take 1 tablet (25 mg total) by mouth daily. 90 tablet 0   torsemide (DEMADEX) 20 MG tablet TAKE 2 TABLETS(40 MG) BY MOUTH DAILY 180 tablet 0   Turmeric (QC TUMERIC COMPLEX PO) Take 1 capsule by mouth in the morning. Qunol Turmeric Curcumin- 40 MG     No current facility-administered medications for this visit.    Allergies:   Atorvastatin and Simvastatin   Social History:  The patient  reports  that he has quit smoking. He has never used smokeless tobacco. He reports that he does not currently use alcohol after a past usage of about 2.0 standard drinks of alcohol per week. He reports that he does not currently use drugs.   Family History:   family history includes CAD in his father; Diabetes in his brother; Pancreatic cancer in his mother.    Review of Systems: Review of Systems  Constitutional: Negative.   HENT: Negative.    Respiratory:  Positive for shortness of breath.   Cardiovascular:  Positive for leg swelling.  Gastrointestinal: Negative.   Musculoskeletal: Negative.        Legs are weak  Neurological: Negative.   Psychiatric/Behavioral: Negative.    All other systems reviewed and are negative.   PHYSICAL EXAM: VS:  BP 132/64 (BP Location: Left Arm, Patient Position:  Sitting, Cuff Size: Normal)   Pulse 60   Ht 5\' 8"  (1.727 m)   Wt 263 lb (119.3 kg)   SpO2 94%   BMI 39.99 kg/m  , BMI Body mass index is 39.99 kg/m.  Constitutional:  oriented to person, place, and time. No distress.  HENT:  Head: Grossly normal Eyes:  no discharge. No scleral icterus.  Neck: No JVD, no carotid bruits  Cardiovascular: Regular rate and rhythm, no murmurs appreciated Woody lower extremity edema extending into the thighs Pulmonary/Chest: Clear to auscultation bilaterally, no wheezes Scattered Rales, unable to exclude dullness at the bases Abdominal: Soft.  Mild distension.  no tenderness.  Musculoskeletal: Normal range of motion Neurological:  normal muscle tone. Coordination normal. No atrophy Skin: Skin warm and dry Psychiatric: normal affect, pleasant   Recent Labs: 05/05/2021: TSH 2.538 12/02/2021: ALT 16; B Natriuretic Peptide 260.5; BUN 51; Creatinine, Ser 2.69; Hemoglobin 11.2; Platelets 158; Potassium 4.0; Sodium 141    Lipid Panel Lab Results  Component Value Date   CHOL 121 05/05/2021   HDL 34 (L) 05/05/2021   LDLCALC 68 05/05/2021   TRIG 95 05/05/2021     Wt Readings from Last 3 Encounters:  01/05/22 263 lb (119.3 kg)  12/02/21 260 lb (117.9 kg)  08/25/21 261 lb 8 oz (118.6 kg)     ASSESSMENT AND PLAN:  Problem List Items Addressed This Visit       Cardiology Problems   Atherosclerotic heart disease of native coronary 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 (HCC) (Chronic)   Relevant Medications   isosorbide mononitrate (IMDUR) 30 MG 24 hr tablet   Other Relevant Orders   EKG 12-Lead   Venous insufficiency (Chronic)   Relevant Medications   isosorbide mononitrate (IMDUR) 30 MG 24 hr tablet   Chronic heart failure with preserved ejection fraction (HFpEF) (HCC) - Primary (Chronic)   Relevant Medications   isosorbide mononitrate (IMDUR) 30 MG 24 hr tablet   Other Relevant Orders   EKG 12-Lead   Hyperlipidemia associated with type 2 diabetes mellitus (HCC) (Chronic)   Relevant Medications   isosorbide mononitrate (IMDUR) 30 MG 24 hr tablet   Other Relevant Orders   EKG 12-Lead     Other   CKD (chronic kidney disease) stage 4, GFR 15-29 ml/min (HCC) (Chronic)   Morbid obesity (HCC)   Other Visit Diagnoses     Chronic a-fib (HCC)       Relevant Medications   isosorbide mononitrate (IMDUR) 30 MG 24 hr tablet   Other Relevant Orders   EKG 12-Lead   Benign essential HTN       Relevant Medications   isosorbide mononitrate (IMDUR) 30 MG 24 hr tablet     Persistent atrial fibrillation Slow ventricular rate in the past, not on beta-blockers or calcium channel blockers Avoid beta-blockers, calcium channel blockers On anticoagulation Eliquis 2.5 twice daily Rate relatively well controlled on today's visit  Chronic diastolic CHF Likely pulmonary hypertension contributing, moderately elevated pressures in early 2021 Repeat echocardiogram ordered -We will reorder furosex 5 days, recommend he take this every other day Suggest to increase torsemide up to 40 in the morning 20 in the afternoon Consider adding a dose of  metolazone sparingly  leg wraps for leg swelling, concern for lymphedema -He is declining hospital admission and IV Lasix at this time -Fluid restriction discussed  Morbid obesity Unable to exercise, calorie restriction recommended  Chronic renal failure Followed by nephrology Creatinine greater than 2.5 in the setting of chronic  diuretic use, pulmonary hypertension, diabetes  Essential hypertension Blood pressure is well controlled on today's visit. No changes made to the medications.    Total encounter time more than 40 minutes  Greater than 50% was spent in counseling and coordination of care with the patient    Signed, Esmond Plants, M.D., Ph.D. [place], [place]

## 2022-01-05 ENCOUNTER — Ambulatory Visit: Payer: Medicare HMO | Attending: Cardiovascular Disease | Admitting: Cardiovascular Disease

## 2022-01-05 ENCOUNTER — Encounter: Payer: Self-pay | Admitting: Cardiovascular Disease

## 2022-01-05 VITALS — BP 132/64 | HR 60 | Ht 68.0 in | Wt 263.0 lb

## 2022-01-05 DIAGNOSIS — E1169 Type 2 diabetes mellitus with other specified complication: Secondary | ICD-10-CM | POA: Diagnosis not present

## 2022-01-05 DIAGNOSIS — I10 Essential (primary) hypertension: Secondary | ICD-10-CM

## 2022-01-05 DIAGNOSIS — I5032 Chronic diastolic (congestive) heart failure: Secondary | ICD-10-CM

## 2022-01-05 DIAGNOSIS — I5033 Acute on chronic diastolic (congestive) heart failure: Secondary | ICD-10-CM

## 2022-01-05 DIAGNOSIS — I25118 Atherosclerotic heart disease of native coronary 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: Secondary | ICD-10-CM | POA: Diagnosis not present

## 2022-01-05 DIAGNOSIS — I482 Chronic atrial fibrillation, unspecified: Secondary | ICD-10-CM

## 2022-01-05 DIAGNOSIS — E785 Hyperlipidemia, unspecified: Secondary | ICD-10-CM

## 2022-01-05 DIAGNOSIS — Z79899 Other long term (current) drug therapy: Secondary | ICD-10-CM

## 2022-01-05 DIAGNOSIS — I872 Venous insufficiency (chronic) (peripheral): Secondary | ICD-10-CM

## 2022-01-05 DIAGNOSIS — N184 Chronic kidney disease, stage 4 (severe): Secondary | ICD-10-CM

## 2022-01-05 DIAGNOSIS — I272 Pulmonary hypertension, unspecified: Secondary | ICD-10-CM

## 2022-01-05 MED ORDER — TORSEMIDE 20 MG PO TABS: ORAL_TABLET | ORAL | 0 refills | Status: DC

## 2022-01-05 NOTE — Patient Instructions (Addendum)
Appt with Dr. 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    Lab work: - Your physician recommends that you return for lab work in: 2-3 weeks  BMP  Nature conservation officer at [facility] 1st desk on the right to check in (REGISTRATION)  Lab hours: Monday- Friday (7:30 am- 5:30 pm)    Testing/Procedures: 1) Echocardiogram: (pulmonary HTN, CHF, leg swelling) - Your physician has requested that you have an echocardiogram. Echocardiography is a painless test that uses sound waves to create images of your heart. It provides your doctor with information about the size and shape of your heart and how well your heart's chambers and valves are working. This procedure takes approximately one hour. There are no restrictions for this procedure. There is a possibility that an IV may need to be started during your test to inject an image enhancing agent. This is done to obtain more optimal pictures of your heart. Therefore we ask that you do at least drink some water prior to coming in to hydrate your veins.   Please do NOT wear cologne, perfume, aftershave, or lotions (deodorant is allowed). Please arrive 15 minutes prior to your appointment time.   2) You have been referred to:  Dr. 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  As part of our continuing mission to provide you with exceptional heart care, we have created designated Provider Care Teams.  These Care Teams include your primary Cardiologist (physician) and Advanced Practice Providers (APPs -  Physician Assistants and Nurse Practitioners) who all work together to provide you with the care you need, when you need it.  You will need a follow up appointment in 3 months  Providers on your designated Care Team:   Murray Hodgkins, NP Christell Faith, PA-C Cadence Kathlen Mody, [place]  COVID-19 Vaccine Information can be found at: [email or web address].uk For questions related to vaccine distribution or appointments, please email [email or web address] or call 6391542228.

## 2022-01-06 ENCOUNTER — Telehealth: Payer: Self-pay | Admitting: Cardiovascular Disease

## 2022-01-06 MED ORDER — ISOSORBIDE MONONITRATE ER 30 MG PO TB24: ORAL_TABLET | ORAL | 1 refills | Status: DC

## 2022-01-06 MED ORDER — TORSEMIDE 20 MG PO TABS: ORAL_TABLET | ORAL | 1 refills | Status: DC

## 2022-01-06 MED ORDER — METOLAZONE 5 MG PO TABS: ORAL_TABLET | ORAL | 3 refills | Status: DC

## 2022-01-06 MED ORDER — POTASSIUM CHLORIDE CRYS ER 20 MEQ PO TBCR: 20.0000 meq | EXTENDED_RELEASE_TABLET | Freq: Every day | ORAL | 3 refills | Status: DC | PRN

## 2022-01-06 NOTE — Addendum Note (Signed)
Addended by: Alvis Lemmings C on: 01/06/2022 01:43 PM   Modules accepted: Orders

## 2022-01-06 NOTE — Telephone Encounter (Signed)
Furoscix start form faxed to [employer] at [phone]. Confirmation received.  I called and spoke with [name], the patient's signifcant other (OK per DPR) and advised her of the above.  I also advised her that she may be receiving a call from [phone] to confirm the patient's mailing address, etc.  I have asked her to please answer any calls from an [phone] #.  [name] is aware that I clarified with [name], that he will take: Furoscix 80 mg once every other day alternating with torsemide 40 in the AM/ 20 in the PM over a 10 day course. Once furoscix is done, the patient will resume torsemide 40 mg in the AM & 20 mg in the PM every day.   [name] voices understanding of the above and is agreeable.

## 2022-01-06 NOTE — Addendum Note (Signed)
Addended by: Alvis Lemmings C on: 01/06/2022 02:10 PM   Modules accepted: Orders

## 2022-01-06 NOTE — Addendum Note (Signed)
Addended by: Alvis Lemmings C on: 01/06/2022 03:26 PM   Modules accepted: Orders

## 2022-01-07 ENCOUNTER — Telehealth: Payer: Self-pay | Admitting: Cardiovascular Disease

## 2022-01-07 NOTE — Telephone Encounter (Signed)
Fax received from [employer] stating they received the Furoscix start form for Kyle Roberson and are working on the request now.   Patient ID #: [id number]  The program case manager will update [place] as to the status shortly.   Any questions- contact # is [phone] (Monday-Friday 8:00 am- 8:00 pm ET).

## 2022-01-15 ENCOUNTER — Telehealth: Payer: Self-pay | Admitting: Cardiovascular Disease

## 2022-01-15 NOTE — Telephone Encounter (Signed)
Fax received from [employer] stating: We have attempted to contact the patient 6 times to schedule delivery of Furoscix and have been unsuccessful in reaching the patient. When calling today (01/14/22) the phone number provided [phone]) said that it was no longer in service. Please assist with getting a hold of the patient to have them call in to schedule delivery of their RX.

## 2022-01-15 NOTE — Telephone Encounter (Signed)
I attempted to call [name] (the patient's significant other) at [phone]. No answer- a message was received stating the voice mail is full.  I was able to speak with her at this number on 01/06/22- see phone note- and advised her she would be receiving a call from [employer] that she needed to answer.   We will attempt to reach her at a later time.  Mychart message also sent to the patient/ [name].

## 2022-01-16 ENCOUNTER — Ambulatory Visit: Payer: Medicare HMO | Admitting: Internal Medicine

## 2022-01-16 NOTE — Telephone Encounter (Signed)
MyChart message received from [facility] today. See MyChart message for further details.  Closing this encounter.

## 2022-01-19 ENCOUNTER — Ambulatory Visit: Payer: Medicare HMO

## 2022-01-19 ENCOUNTER — Encounter (INDEPENDENT_AMBULATORY_CARE_PROVIDER_SITE_OTHER): Payer: Self-pay

## 2022-01-22 ENCOUNTER — Ambulatory Visit: Payer: Medicare HMO

## 2022-01-26 ENCOUNTER — Ambulatory Visit: Payer: Medicare HMO | Admitting: Internal Medicine

## 2022-02-03 ENCOUNTER — Ambulatory Visit: Payer: Medicare HMO | Admitting: Internal Medicine

## 2022-02-04 ENCOUNTER — Ambulatory Visit: Payer: Medicare HMO

## 2022-02-08 ENCOUNTER — Emergency Department: Payer: Medicare HMO

## 2022-02-08 ENCOUNTER — Encounter: Payer: Self-pay | Admitting: Emergency Medicine

## 2022-02-08 DIAGNOSIS — F32A Depression, unspecified: Secondary | ICD-10-CM | POA: Diagnosis present

## 2022-02-08 DIAGNOSIS — E785 Hyperlipidemia, unspecified: Secondary | ICD-10-CM | POA: Diagnosis present

## 2022-02-08 DIAGNOSIS — I2510 Atherosclerotic heart disease of native coronary artery without angina pectoris: Secondary | ICD-10-CM | POA: Diagnosis present

## 2022-02-08 DIAGNOSIS — K219 Gastro-esophageal reflux disease without esophagitis: Secondary | ICD-10-CM | POA: Diagnosis present

## 2022-02-08 DIAGNOSIS — I2721 Secondary pulmonary arterial hypertension: Secondary | ICD-10-CM | POA: Diagnosis present

## 2022-02-08 DIAGNOSIS — I252 Old myocardial infarction: Secondary | ICD-10-CM

## 2022-02-08 DIAGNOSIS — Z87891 Personal history of nicotine dependence: Secondary | ICD-10-CM

## 2022-02-08 DIAGNOSIS — Z8249 Family history of ischemic heart disease and other diseases of the circulatory system: Secondary | ICD-10-CM

## 2022-02-08 DIAGNOSIS — N170 Acute kidney failure with tubular necrosis: Secondary | ICD-10-CM | POA: Diagnosis not present

## 2022-02-08 DIAGNOSIS — Z888 Allergy status to other drugs, medicaments and biological substances status: Secondary | ICD-10-CM

## 2022-02-08 DIAGNOSIS — Z794 Long term (current) use of insulin: Secondary | ICD-10-CM

## 2022-02-08 DIAGNOSIS — Z6841 Body Mass Index (BMI) 40.0 and over, adult: Secondary | ICD-10-CM

## 2022-02-08 DIAGNOSIS — T501X5A Adverse effect of loop [high-ceiling] diuretics, initial encounter: Secondary | ICD-10-CM | POA: Diagnosis not present

## 2022-02-08 DIAGNOSIS — Z79899 Other long term (current) drug therapy: Secondary | ICD-10-CM

## 2022-02-08 DIAGNOSIS — N184 Chronic kidney disease, stage 4 (severe): Secondary | ICD-10-CM | POA: Diagnosis present

## 2022-02-08 DIAGNOSIS — E1122 Type 2 diabetes mellitus with diabetic chronic kidney disease: Secondary | ICD-10-CM | POA: Diagnosis present

## 2022-02-08 DIAGNOSIS — I4821 Permanent atrial fibrillation: Secondary | ICD-10-CM | POA: Diagnosis present

## 2022-02-08 DIAGNOSIS — E1165 Type 2 diabetes mellitus with hyperglycemia: Secondary | ICD-10-CM | POA: Diagnosis present

## 2022-02-08 DIAGNOSIS — I5033 Acute on chronic diastolic (congestive) heart failure: Secondary | ICD-10-CM | POA: Diagnosis present

## 2022-02-08 DIAGNOSIS — I4510 Unspecified right bundle-branch block: Secondary | ICD-10-CM | POA: Diagnosis present

## 2022-02-08 DIAGNOSIS — Z833 Family history of diabetes mellitus: Secondary | ICD-10-CM

## 2022-02-08 DIAGNOSIS — Z7984 Long term (current) use of oral hypoglycemic drugs: Secondary | ICD-10-CM

## 2022-02-08 DIAGNOSIS — I130 Hypertensive heart and chronic kidney disease with heart failure and stage 1 through stage 4 chronic kidney disease, or unspecified chronic kidney disease: Principal | ICD-10-CM | POA: Diagnosis present

## 2022-02-08 DIAGNOSIS — I2489 Other forms of acute ischemic heart disease: Secondary | ICD-10-CM | POA: Diagnosis present

## 2022-02-08 DIAGNOSIS — Z7901 Long term (current) use of anticoagulants: Secondary | ICD-10-CM

## 2022-02-08 DIAGNOSIS — I5082 Biventricular heart failure: Secondary | ICD-10-CM | POA: Diagnosis present

## 2022-02-08 DIAGNOSIS — D631 Anemia in chronic kidney disease: Secondary | ICD-10-CM | POA: Diagnosis present

## 2022-02-08 DIAGNOSIS — E11649 Type 2 diabetes mellitus with hypoglycemia without coma: Secondary | ICD-10-CM | POA: Diagnosis not present

## 2022-02-08 DIAGNOSIS — Z800 Family history of malignant neoplasm of digestive organs: Secondary | ICD-10-CM

## 2022-02-08 DIAGNOSIS — Z955 Presence of coronary angioplasty implant and graft: Secondary | ICD-10-CM

## 2022-02-08 DIAGNOSIS — J9601 Acute respiratory failure with hypoxia: Secondary | ICD-10-CM | POA: Diagnosis not present

## 2022-02-08 LAB — CBC WITH DIFFERENTIAL/PLATELET
Abs Immature Granulocytes: 0.02 10*3/uL (ref 0.00–0.07)
Basophils Absolute: 0 10*3/uL (ref 0.0–0.1)
Basophils Relative: 1 %
Eosinophils Absolute: 0.2 10*3/uL (ref 0.0–0.5)
Eosinophils Relative: 4 %
HCT: 37.3 % — ABNORMAL LOW (ref 39.0–52.0)
Hemoglobin: 11.4 g/dL — ABNORMAL LOW (ref 13.0–17.0)
Immature Granulocytes: 0 %
Lymphocytes Relative: 9 %
Lymphs Abs: 0.5 10*3/uL — ABNORMAL LOW (ref 0.7–4.0)
MCH: 28.6 pg (ref 26.0–34.0)
MCHC: 30.6 g/dL (ref 30.0–36.0)
MCV: 93.5 fL (ref 80.0–100.0)
Monocytes Absolute: 0.5 10*3/uL (ref 0.1–1.0)
Monocytes Relative: 9 %
Neutro Abs: 4.3 10*3/uL (ref 1.7–7.7)
Neutrophils Relative %: 77 %
Platelets: 176 10*3/uL (ref 150–400)
RBC: 3.99 MIL/uL — ABNORMAL LOW (ref 4.22–5.81)
RDW: 16.1 % — ABNORMAL HIGH (ref 11.5–15.5)
WBC: 5.6 10*3/uL (ref 4.0–10.5)
nRBC: 0 % (ref 0.0–0.2)

## 2022-02-08 LAB — COMPREHENSIVE METABOLIC PANEL
ALT: 13 U/L (ref 0–44)
AST: 18 U/L (ref 15–41)
Albumin: 3.8 g/dL (ref 3.5–5.0)
Alkaline Phosphatase: 75 U/L (ref 38–126)
Anion gap: 8 (ref 5–15)
BUN: 41 mg/dL — ABNORMAL HIGH (ref 8–23)
CO2: 29 mmol/L (ref 22–32)
Calcium: 9 mg/dL (ref 8.9–10.3)
Chloride: 105 mmol/L (ref 98–111)
Creatinine, Ser: 2.69 mg/dL — ABNORMAL HIGH (ref 0.61–1.24)
GFR, Estimated: 23 mL/min — ABNORMAL LOW (ref >60)
Glucose, Bld: 109 mg/dL — ABNORMAL HIGH (ref 70–99)
Potassium: 4.7 mmol/L (ref 3.5–5.1)
Sodium: 142 mmol/L (ref 135–145)
Total Bilirubin: 1 mg/dL (ref 0.3–1.2)
Total Protein: 7.1 g/dL (ref 6.5–8.1)

## 2022-02-08 LAB — TROPONIN I (HIGH SENSITIVITY)
Troponin I (High Sensitivity): 22 ng/L — ABNORMAL HIGH (ref <18)
Troponin I (High Sensitivity): 21 ng/L — ABNORMAL HIGH (ref <18)

## 2022-02-08 LAB — BRAIN NATRIURETIC PEPTIDE: B Natriuretic Peptide: 263.5 pg/mL — ABNORMAL HIGH (ref 0.0–100.0)

## 2022-02-08 NOTE — ED Provider Triage Note (Signed)
Emergency Medicine Provider Triage Evaluation Note  Kyle Roberson , a 81 y.o. male  was evaluated in triage.  Pt complains of fluid retention.  Was originally 55 and is down to 72.  Doing the injections for congestive heart failure..  Review of Systems  Positive:  Negative:   Physical Exam  BP (!) 153/72 (BP Location: Right Arm)   Pulse (!) 56   Temp 98.5 F (36.9 C) (Oral)   Resp 20   Ht 5\' 8"  (1.727 m)   Wt 123.4 kg   SpO2 95%   BMI 41.36 kg/m  Gen:   Awake, no distress   Resp:  Increased effort MSK:   Moves extremities without difficulty  Other:    Medical Decision Making  Medically screening exam initiated at 7:17 PM.  Appropriate orders placed.  Burnett Spray was informed that the remainder of the evaluation will be completed by another provider, this initial triage assessment does not replace that evaluation, and the importance of remaining in the ED until their evaluation is complete.  CHF protocols initiated   Versie Starks, PA-C 02/08/22 1918

## 2022-02-09 ENCOUNTER — Other Ambulatory Visit: Payer: Self-pay

## 2022-02-09 ENCOUNTER — Inpatient Hospital Stay (HOSPITAL_COMMUNITY)
Admit: 2022-02-09 | Discharge: 2022-02-09 | Disposition: A | Discharge status: Home / Self Care | Payer: Medicare HMO | Attending: Family Medicine | Admitting: Family Medicine

## 2022-02-09 ENCOUNTER — Emergency Department: Payer: Medicare HMO

## 2022-02-09 ENCOUNTER — Encounter: Payer: Self-pay | Admitting: Family Medicine

## 2022-02-09 ENCOUNTER — Inpatient Hospital Stay
Admission: EM | Admit: 2022-02-09 | Discharge: 2022-02-21 | DRG: 291 | Disposition: A | Discharge status: Home / Self Care | Payer: Medicare HMO | Attending: Internal Medicine | Admitting: Internal Medicine

## 2022-02-09 DIAGNOSIS — I272 Pulmonary hypertension, unspecified: Secondary | ICD-10-CM | POA: Diagnosis not present

## 2022-02-09 DIAGNOSIS — Z87891 Personal history of nicotine dependence: Secondary | ICD-10-CM | POA: Diagnosis not present

## 2022-02-09 DIAGNOSIS — I130 Hypertensive heart and chronic kidney disease with heart failure and stage 1 through stage 4 chronic kidney disease, or unspecified chronic kidney disease: Secondary | ICD-10-CM | POA: Diagnosis present

## 2022-02-09 DIAGNOSIS — E785 Hyperlipidemia, unspecified: Secondary | ICD-10-CM | POA: Diagnosis present

## 2022-02-09 DIAGNOSIS — I5020 Unspecified systolic (congestive) heart failure: Principal | ICD-10-CM

## 2022-02-09 DIAGNOSIS — I482 Chronic atrial fibrillation, unspecified: Secondary | ICD-10-CM

## 2022-02-09 DIAGNOSIS — F32A Depression, unspecified: Secondary | ICD-10-CM | POA: Diagnosis present

## 2022-02-09 DIAGNOSIS — I2721 Secondary pulmonary arterial hypertension: Secondary | ICD-10-CM | POA: Diagnosis present

## 2022-02-09 DIAGNOSIS — Z992 Dependence on renal dialysis: Secondary | ICD-10-CM

## 2022-02-09 DIAGNOSIS — I5031 Acute diastolic (congestive) heart failure: Secondary | ICD-10-CM | POA: Diagnosis not present

## 2022-02-09 DIAGNOSIS — N184 Chronic kidney disease, stage 4 (severe): Secondary | ICD-10-CM | POA: Diagnosis present

## 2022-02-09 DIAGNOSIS — N179 Acute kidney failure, unspecified: Secondary | ICD-10-CM | POA: Diagnosis not present

## 2022-02-09 DIAGNOSIS — E1169 Type 2 diabetes mellitus with other specified complication: Secondary | ICD-10-CM | POA: Diagnosis not present

## 2022-02-09 DIAGNOSIS — I2489 Other forms of acute ischemic heart disease: Secondary | ICD-10-CM | POA: Diagnosis present

## 2022-02-09 DIAGNOSIS — E1165 Type 2 diabetes mellitus with hyperglycemia: Secondary | ICD-10-CM | POA: Diagnosis present

## 2022-02-09 DIAGNOSIS — E11649 Type 2 diabetes mellitus with hypoglycemia without coma: Secondary | ICD-10-CM | POA: Diagnosis not present

## 2022-02-09 DIAGNOSIS — I5082 Biventricular heart failure: Secondary | ICD-10-CM | POA: Diagnosis present

## 2022-02-09 DIAGNOSIS — Z794 Long term (current) use of insulin: Secondary | ICD-10-CM | POA: Diagnosis not present

## 2022-02-09 DIAGNOSIS — I4821 Permanent atrial fibrillation: Secondary | ICD-10-CM | POA: Diagnosis present

## 2022-02-09 DIAGNOSIS — Z79899 Other long term (current) drug therapy: Secondary | ICD-10-CM | POA: Diagnosis not present

## 2022-02-09 DIAGNOSIS — J9601 Acute respiratory failure with hypoxia: Secondary | ICD-10-CM | POA: Diagnosis not present

## 2022-02-09 DIAGNOSIS — I50813 Acute on chronic right heart failure: Secondary | ICD-10-CM | POA: Diagnosis not present

## 2022-02-09 DIAGNOSIS — N170 Acute kidney failure with tubular necrosis: Secondary | ICD-10-CM

## 2022-02-09 DIAGNOSIS — E1122 Type 2 diabetes mellitus with diabetic chronic kidney disease: Secondary | ICD-10-CM | POA: Diagnosis present

## 2022-02-09 DIAGNOSIS — I4510 Unspecified right bundle-branch block: Secondary | ICD-10-CM | POA: Diagnosis present

## 2022-02-09 DIAGNOSIS — I2510 Atherosclerotic heart disease of native coronary artery without angina pectoris: Secondary | ICD-10-CM | POA: Diagnosis present

## 2022-02-09 DIAGNOSIS — Z6841 Body Mass Index (BMI) 40.0 and over, adult: Secondary | ICD-10-CM | POA: Diagnosis not present

## 2022-02-09 DIAGNOSIS — I5033 Acute on chronic diastolic (congestive) heart failure: Secondary | ICD-10-CM | POA: Diagnosis present

## 2022-02-09 DIAGNOSIS — I480 Paroxysmal atrial fibrillation: Secondary | ICD-10-CM | POA: Diagnosis present

## 2022-02-09 DIAGNOSIS — Z7901 Long term (current) use of anticoagulants: Secondary | ICD-10-CM | POA: Diagnosis not present

## 2022-02-09 DIAGNOSIS — I10 Essential (primary) hypertension: Secondary | ICD-10-CM | POA: Diagnosis present

## 2022-02-09 DIAGNOSIS — D631 Anemia in chronic kidney disease: Secondary | ICD-10-CM | POA: Diagnosis present

## 2022-02-09 DIAGNOSIS — R778 Other specified abnormalities of plasma proteins: Secondary | ICD-10-CM | POA: Diagnosis not present

## 2022-02-09 DIAGNOSIS — R7989 Other specified abnormal findings of blood chemistry: Secondary | ICD-10-CM | POA: Diagnosis present

## 2022-02-09 DIAGNOSIS — T501X5A Adverse effect of loop [high-ceiling] diuretics, initial encounter: Secondary | ICD-10-CM | POA: Diagnosis not present

## 2022-02-09 HISTORY — DX: Chronic kidney disease, stage 4 (severe): N18.4

## 2022-02-09 HISTORY — DX: Secondary pulmonary arterial hypertension: I27.21

## 2022-02-09 HISTORY — DX: Traumatic subdural hemorrhage with loss of consciousness status unknown, initial encounter: S06.5XAA

## 2022-02-09 HISTORY — DX: Chronic diastolic (congestive) heart failure: I50.32

## 2022-02-09 HISTORY — DX: Permanent atrial fibrillation: I48.21

## 2022-02-09 LAB — ECHOCARDIOGRAM COMPLETE
AR max vel: 1.46 cm2
AV Area VTI: 1.45 cm2
AV Area mean vel: 1.46 cm2
AV Mean grad: 7 mmHg
AV Peak grad: 12.7 mmHg
Ao pk vel: 1.78 m/s
Area-P 1/2: 3.6 cm2
P 1/2 time: 485 msec
S' Lateral: 3.7 cm

## 2022-02-09 LAB — BASIC METABOLIC PANEL
Anion gap: 8 (ref 5–15)
BUN: 41 mg/dL — ABNORMAL HIGH (ref 8–23)
CO2: 27 mmol/L (ref 22–32)
Calcium: 8.7 mg/dL — ABNORMAL LOW (ref 8.9–10.3)
Chloride: 108 mmol/L (ref 98–111)
Creatinine, Ser: 2.51 mg/dL — ABNORMAL HIGH (ref 0.61–1.24)
GFR, Estimated: 25 mL/min — ABNORMAL LOW (ref >60)
Glucose, Bld: 168 mg/dL — ABNORMAL HIGH (ref 70–99)
Potassium: 4.1 mmol/L (ref 3.5–5.1)
Sodium: 143 mmol/L (ref 135–145)

## 2022-02-09 LAB — GLUCOSE, CAPILLARY
Glucose-Capillary: 175 mg/dL — ABNORMAL HIGH (ref 70–99)
Glucose-Capillary: 137 mg/dL — ABNORMAL HIGH (ref 70–99)

## 2022-02-09 LAB — CBC
HCT: 33.9 % — ABNORMAL LOW (ref 39.0–52.0)
Hemoglobin: 10.6 g/dL — ABNORMAL LOW (ref 13.0–17.0)
MCH: 29.4 pg (ref 26.0–34.0)
MCHC: 31.3 g/dL (ref 30.0–36.0)
MCV: 93.9 fL (ref 80.0–100.0)
Platelets: 155 10*3/uL (ref 150–400)
RBC: 3.61 MIL/uL — ABNORMAL LOW (ref 4.22–5.81)
RDW: 16.1 % — ABNORMAL HIGH (ref 11.5–15.5)
WBC: 6 10*3/uL (ref 4.0–10.5)
nRBC: 0 % (ref 0.0–0.2)

## 2022-02-09 LAB — CBG MONITORING, ED: Glucose-Capillary: 185 mg/dL — ABNORMAL HIGH (ref 70–99)

## 2022-02-09 MED ORDER — TURMERIC 500 MG PO CAPS: ORAL_CAPSULE | Freq: Every morning | ORAL | Status: DC

## 2022-02-09 MED ORDER — ISOSORBIDE MONONITRATE ER 60 MG PO TB24
90.0000 mg | ORAL_TABLET | Freq: Once | ORAL | Status: AC
  Administered 2022-02-09: 90 mg via ORAL
  Filled 2022-02-09: qty 2

## 2022-02-09 MED ORDER — NEOMYCIN-POLYMYXIN-DEXAMETH 0.1 % OP OINT: 1.0000 | TOPICAL_OINTMENT | Freq: Three times a day (TID) | OPHTHALMIC | Status: DC

## 2022-02-09 MED ORDER — HYDRALAZINE HCL 50 MG PO TABS
100.0000 mg | ORAL_TABLET | Freq: Once | ORAL | Status: AC
  Administered 2022-02-09: 100 mg via ORAL
  Filled 2022-02-09: qty 2

## 2022-02-09 MED ORDER — OMEGA-3-ACID ETHYL ESTERS 1 G PO CAPS
1.0000 g | ORAL_CAPSULE | Freq: Every day | ORAL | Status: DC
  Administered 2022-02-09 – 2022-02-21 (×13): 1 g via ORAL
  Filled 2022-02-09 (×14): qty 1

## 2022-02-09 MED ORDER — ONDANSETRON HCL 4 MG/2ML IJ SOLN: 4.0000 mg | Freq: Four times a day (QID) | INTRAMUSCULAR | Status: DC | PRN

## 2022-02-09 MED ORDER — FUROSEMIDE 10 MG/ML IJ SOLN
80.0000 mg | Freq: Two times a day (BID) | INTRAMUSCULAR | Status: DC
  Administered 2022-02-09 – 2022-02-12 (×7): 80 mg via INTRAVENOUS
  Filled 2022-02-09 (×7): qty 8

## 2022-02-09 MED ORDER — ONDANSETRON HCL 4 MG PO TABS: 4.0000 mg | ORAL_TABLET | Freq: Four times a day (QID) | ORAL | Status: DC | PRN

## 2022-02-09 MED ORDER — INSULIN NPH (HUMAN) (ISOPHANE) 100 UNIT/ML ~~LOC~~ SUSP: 8.0000 [IU] | Freq: Every day | SUBCUTANEOUS | Status: DC

## 2022-02-09 MED ORDER — INSULIN ASPART 100 UNIT/ML IJ SOLN: 0.0000 [IU] | Freq: Every day | INTRAMUSCULAR | Status: DC

## 2022-02-09 MED ORDER — POTASSIUM CHLORIDE CRYS ER 20 MEQ PO TBCR: 20.0000 meq | EXTENDED_RELEASE_TABLET | Freq: Every day | ORAL | Status: DC | PRN

## 2022-02-09 MED ORDER — GLIPIZIDE ER 10 MG PO TB24
10.0000 mg | ORAL_TABLET | Freq: Every day | ORAL | Status: DC
  Administered 2022-02-09 – 2022-02-11 (×3): 10 mg via ORAL
  Filled 2022-02-09 (×3): qty 1

## 2022-02-09 MED ORDER — MAGNESIUM HYDROXIDE 400 MG/5ML PO SUSP: 30.0000 mL | Freq: Every day | ORAL | Status: DC | PRN

## 2022-02-09 MED ORDER — TRAZODONE HCL 50 MG PO TABS
25.0000 mg | ORAL_TABLET | Freq: Every evening | ORAL | Status: DC | PRN
  Filled 2022-02-09 (×2): qty 1

## 2022-02-09 MED ORDER — FUROSEMIDE 10 MG/ML IJ SOLN
80.0000 mg | Freq: Once | INTRAMUSCULAR | Status: AC
  Administered 2022-02-09: 80 mg via INTRAVENOUS
  Filled 2022-02-09: qty 8

## 2022-02-09 MED ORDER — DOXAZOSIN MESYLATE 1 MG PO TABS
1.0000 mg | ORAL_TABLET | Freq: Two times a day (BID) | ORAL | Status: DC
  Administered 2022-02-09 – 2022-02-21 (×25): 1 mg via ORAL
  Filled 2022-02-09 (×26): qty 1

## 2022-02-09 MED ORDER — COENZYME Q10-VITAMIN E 100-150 MG-UNIT PO CAPS: ORAL_CAPSULE | Freq: Every morning | ORAL | Status: DC

## 2022-02-09 MED ORDER — APIXABAN 2.5 MG PO TABS
2.5000 mg | ORAL_TABLET | Freq: Two times a day (BID) | ORAL | Status: DC
  Administered 2022-02-09 – 2022-02-21 (×25): 2.5 mg via ORAL
  Filled 2022-02-09 (×25): qty 1

## 2022-02-09 MED ORDER — ENOXAPARIN SODIUM 40 MG/0.4ML IJ SOSY: 40.0000 mg | PREFILLED_SYRINGE | INTRAMUSCULAR | Status: DC

## 2022-02-09 MED ORDER — VITAMIN D 25 MCG (1000 UNIT) PO TABS
1000.0000 [IU] | ORAL_TABLET | Freq: Every day | ORAL | Status: DC
  Administered 2022-02-09 – 2022-02-21 (×13): 1000 [IU] via ORAL
  Filled 2022-02-09 (×13): qty 1

## 2022-02-09 MED ORDER — ROSUVASTATIN CALCIUM 5 MG PO TABS
5.0000 mg | ORAL_TABLET | ORAL | Status: DC
  Administered 2022-02-09 – 2022-02-20 (×6): 5 mg via ORAL
  Filled 2022-02-09 (×7): qty 1

## 2022-02-09 MED ORDER — ADULT MULTIVITAMIN W/MINERALS CH
1.0000 | ORAL_TABLET | Freq: Every day | ORAL | Status: DC
  Administered 2022-02-09 – 2022-02-21 (×13): 1 via ORAL
  Filled 2022-02-09 (×13): qty 1

## 2022-02-09 MED ORDER — NITROGLYCERIN 0.4 MG/SPRAY TL SOLN: 1.0000 | Status: DC

## 2022-02-09 MED ORDER — FLAXSEED OIL 1000 MG PO CAPS: ORAL_CAPSULE | Freq: Every morning | ORAL | Status: DC

## 2022-02-09 MED ORDER — ACETAMINOPHEN 650 MG RE SUPP: 650.0000 mg | Freq: Four times a day (QID) | RECTAL | Status: DC | PRN

## 2022-02-09 MED ORDER — POTASSIUM CHLORIDE IN NACL 20-0.9 MEQ/L-% IV SOLN
INTRAVENOUS | Status: DC
  Filled 2022-02-09: qty 1000

## 2022-02-09 MED ORDER — DORZOLAMIDE HCL-TIMOLOL MAL 2-0.5 % OP SOLN
1.0000 [drp] | Freq: Two times a day (BID) | OPHTHALMIC | Status: DC
  Administered 2022-02-09 – 2022-02-21 (×24): 1 [drp] via OPHTHALMIC
  Filled 2022-02-09: qty 10

## 2022-02-09 MED ORDER — METOLAZONE 5 MG PO TABS
5.0000 mg | ORAL_TABLET | ORAL | Status: DC
  Administered 2022-02-09: 5 mg via ORAL
  Filled 2022-02-09: qty 1

## 2022-02-09 MED ORDER — INSULIN ASPART 100 UNIT/ML IJ SOLN
0.0000 [IU] | Freq: Three times a day (TID) | INTRAMUSCULAR | Status: DC
  Administered 2022-02-09 – 2022-02-10 (×3): 3 [IU] via SUBCUTANEOUS
  Filled 2022-02-09 (×3): qty 1

## 2022-02-09 MED ORDER — SERTRALINE HCL 50 MG PO TABS: 25.0000 mg | ORAL_TABLET | Freq: Every day | ORAL | Status: DC

## 2022-02-09 MED ORDER — HYDRALAZINE HCL 50 MG PO TABS
100.0000 mg | ORAL_TABLET | Freq: Three times a day (TID) | ORAL | Status: DC
  Administered 2022-02-09 – 2022-02-21 (×37): 100 mg via ORAL
  Filled 2022-02-09 (×37): qty 2

## 2022-02-09 MED ORDER — ACETAMINOPHEN 325 MG PO TABS
650.0000 mg | ORAL_TABLET | Freq: Four times a day (QID) | ORAL | Status: DC | PRN
  Administered 2022-02-11 (×2): 650 mg via ORAL
  Filled 2022-02-09 (×2): qty 2

## 2022-02-09 MED ORDER — VITAMIN C 500 MG PO TABS
500.0000 mg | ORAL_TABLET | Freq: Every day | ORAL | Status: DC
  Administered 2022-02-09 – 2022-02-21 (×12): 500 mg via ORAL
  Filled 2022-02-09 (×13): qty 1

## 2022-02-09 NOTE — Assessment & Plan Note (Signed)
-   We will continue statin therapy. 

## 2022-02-09 NOTE — Assessment & Plan Note (Signed)
Most likely secondary to demand ischemia with acute on chronic diastolic heart failure and severe pulmonary hypertension. -Continue to monitor

## 2022-02-09 NOTE — H&P (Signed)
[place]   PATIENT NAME: Kyle Roberson    MR#:  [id number]  DATE OF BIRTH:  [birth year]  DATE OF ADMISSION:  02/09/2022  PRIMARY CARE PHYSICIAN: Glean Hess, MD   Patient is coming from: Home  REQUESTING/REFERRING PHYSICIAN: Conni Slipper, MD  CHIEF COMPLAINT:   Chief Complaint  Patient presents with   Leg Swelling    HISTORY OF PRESENT ILLNESS:  Kyle Roberson is a 81 y.o. male with medical history significant for coronary artery disease, type 2 diabetes mellitus, GERD, hypertension and dyslipidemia, who presented to the emergency room with acute onset of abdominal distention and weight gain of more than 20 pounds over the last couple of weeks.  The patient has been getting subcutaneous and p.o. Lasix as well as Zaroxolyn.  He has been diuresing however his weight has been increasing.  He has been experiencing worsening dyspnea.  He admits to orthopnea and paroxysmal nocturnal dyspnea as well as dyspnea on exertion with worsening lower extremity edema.  No fever or chills.  No dysuria, oliguria, urinary frequency or urgency or flank pain.  No cough or wheezing.  No nausea or vomiting or diarrhea or abdominal pain.  He is expected to have an echo by [name] soon.  ED Course: When he came to the ER, BP was 153/72 with heart rate of 56 and otherwise normal vital signs.  Labs revealed BUN of 41 and creatinine 2.69 that are fairly stable from previous levels.  BNP was 263.5 and high-sensitivity troponin I was 22 and later 21.  CBC showed hemoglobin of 11.4 hematocrit 37.3 EKG as reviewed by me : EKG showed junctional rhythm with a rate of 57 with right axis deviation and low voltage QRS as well as incomplete right bundle branch block with Imaging: Two-view chest x-ray showed mild diffuse interstitial infiltrate suspicious for mild residual edema.  The patient was given 80 mg of IV Lasix, 100 mg of p.o. hydralazine, 90 mg of p.o. and urine 2.5 mg of p.o. Eliquis.   She will be admitted to a cardiac telemetry bed for further evaluation and management.  PAST MEDICAL HISTORY:   Past Medical History:  Diagnosis Date   AKI (acute kidney injury) ([place]) 07/06/2018   CAD (coronary artery disease)    Cellulitis of lower extremity 05/31/2019   Diabetes (HCC)    GERD (gastroesophageal reflux disease)    History of MI (myocardial infarction) 06/27/2014   HLD (hyperlipidemia)    HTN (hypertension)     PAST SURGICAL HISTORY:   Past Surgical History:  Procedure Laterality Date   BLEPHAROPLASTY     CARDIAC CATHETERIZATION     CORONARY STENT INTERVENTION     CRANIOTOMY Left 02/04/2021   Procedure: CRANIOTOMY FOR LEFT SUBDURAL  HEMATOMA EVACUATION;  Surgeon: Deetta Perla, MD;  Location: [facility] ORS;  Service: Neurosurgery;  Laterality: Left;   CYSTOSCOPY     HERNIA REPAIR      SOCIAL HISTORY:   Social History   Tobacco Use   Smoking status: Former   Smokeless tobacco: Never  Substance Use Topics   Alcohol use: Not Currently    Alcohol/week: 2.0 standard drinks of alcohol    Types: 2 Glasses of wine per week    FAMILY HISTORY:   Family History  Problem Relation Age of Onset   Pancreatic cancer Mother    CAD Father    Diabetes Brother     DRUG ALLERGIES:   Allergies  Allergen Reactions   Atorvastatin  Hives, Itching and Other (See Comments)    Other reaction(s): Other (See Comments)   Simvastatin Hives, Itching and Other (See Comments)    Other reaction(s): Other (See Comments)    REVIEW OF SYSTEMS:   ROS As per history of present illness. All pertinent systems were reviewed above. Constitutional, HEENT, cardiovascular, respiratory, GI, GU, musculoskeletal, neuro, psychiatric, endocrine, integumentary and hematologic systems were reviewed and are otherwise negative/unremarkable except for positive findings mentioned above in the HPI.   MEDICATIONS AT HOME:   Prior to Admission medications   Medication Sig Start Date End Date Taking?  Authorizing Provider  apixaban (ELIQUIS) 2.5 MG TABS tablet TAKE 1 TABLET(2.5 MG) BY MOUTH TWICE DAILY 08/27/21   Minna Merritts, MD  Ascorbic Acid (VITAMIN C PO) Take 1 tablet by mouth in the morning.    [provider]  Cholecalciferol (VITAMIN D3 PO) Take 1 tablet by mouth in the morning.    [provider]  Coenzyme Q10-Vitamin E (QUNOL ULTRA COQ10 PO) Take 1 capsule by mouth in the morning.    [provider]  dorzolamide-timolol (COSOPT) 22.3-6.8 MG/ML ophthalmic solution Place 1 drop into the left eye 2 (two) times daily. 08/28/20   [provider]  doxazosin (CARDURA) 1 MG tablet Take 1 tablet (1 mg total) by mouth 2 (two) times daily. 04/07/21   Minna Merritts, MD  Flaxseed, Linseed, (FLAXSEED OIL PO) Take 1 capsule by mouth in the morning.    [provider]  Furosemide (FUROSCIX) 80 MG/10ML CTKT Inject 80 mg into the skin as needed.    [provider]  glipiZIDE (GLUCOTROL XL) 10 MG 24 hr tablet TAKE 1 TABLET(10 MG) BY MOUTH TWICE DAILY 12/02/21   Glean Hess, MD  hydrALAZINE (APRESOLINE) 100 MG tablet 100 mg three times a day, with extra 100 mg as needed for high pressure. 04/07/21   Minna Merritts, MD  hydrALAZINE (APRESOLINE) 50 MG tablet Take one tab as needed for elevated BP > 170 04/07/21   Gollan, Kathlene November, MD  insulin NPH Human (NOVOLIN N) 100 UNIT/ML injection Inject 8-10 Units into the skin daily before breakfast. Take 10-15 units twice a day after breakfast and before bedtime per Sliding Scale (BS>250: 15 units, BS= 200-250:10 units) 11/30/17   [provider]  isosorbide mononitrate (IMDUR) 30 MG 24 hr tablet Take 3 tablets (90 mg) by mouth once daily 01/06/22   Minna Merritts, MD  metolazone (ZAROXOLYN) 5 MG tablet Take 1 tablet (5 mg) by mouth 2-3 times per week. Take 30 minutes prior to torsemide 01/06/22   Minna Merritts, MD  Multiple Vitamin (MULTIVITAMIN WITH MINERALS) TABS tablet Take 1 tablet by  mouth in the morning.    [provider]  neomycin-polymyxin-dexameth (MAXITROL) 0.1 % OINT Place 1 Application into the right eye 3 (three) times daily. 12/02/21   Glean Hess, MD  nitroGLYCERIN (NITROLINGUAL) 0.4 MG/SPRAY spray Place 1 spray under the tongue as directed. 02/26/21 03/17/22  Angiulli, Lavon Paganini, PA-C  Omega-3 Fatty Acids (FISH OIL PO) Take 1 capsule by mouth in the morning.    [provider]  potassium chloride SA (KLOR-CON M) 20 MEQ tablet Take 1 tablet (20 mEq total) by mouth daily as needed (with metalozone). 01/06/22   Minna Merritts, MD  rosuvastatin (CRESTOR) 5 MG tablet Take 1 tablet (5 mg total) by mouth 3 (three) times a week. 12/03/21   Glean Hess, MD  sertraline (ZOLOFT) 25 MG tablet  Take 1 tablet (25 mg total) by mouth daily. 12/02/21   Glean Hess, MD  torsemide (DEMADEX) 20 MG tablet Take 2 tablets (40 mg) by mouth once daily in the morning & take 1 tablet (20 mg) by mouth once daily at lunch time 01/06/22   Minna Merritts, MD  Turmeric (QC TUMERIC COMPLEX PO) Take 1 capsule by mouth in the morning. Qunol Turmeric Curcumin- 40 MG    [provider]      VITAL SIGNS:  Blood pressure (!) 162/66, pulse 62, temperature 98.4 F (36.9 C), temperature source Oral, resp. rate 20, height 5\' 8"  (1.727 m), weight 123.4 kg, SpO2 97 %.  PHYSICAL EXAMINATION:  Physical Exam  GENERAL:  81 y.o.-year-old Caucasian male patient lying in the bed with no acute distress.  EYES: Pupils equal, round, reactive to light and accommodation. No scleral icterus. Extraocular muscles intact.  HEENT: Head atraumatic, normocephalic. Oropharynx and nasopharynx clear.  NECK:  Supple, no jugular venous distention. No thyroid enlargement, no tenderness.  LUNGS: Diminished bibasilar breath sounds with bibasilar rales.  No use of accessory muscles of respiration.  CARDIOVASCULAR: Regular rate and rhythm, S1, S2 normal. No murmurs, rubs, or gallops.   ABDOMEN: Soft, nondistended, nontender. Bowel sounds present. No organomegaly or mass.  EXTREMITIES: Bilateral lower extremity pitting and nonpitting edema with no cyanosis, or clubbing.  NEUROLOGIC: Cranial nerves II through XII are intact. Muscle strength 5/5 in all extremities. Sensation intact. Gait not checked.  PSYCHIATRIC: The patient is alert and oriented x 3.  Normal affect and good eye contact. SKIN: Bilateral leg skin lichenification and mild erythema with associated pitting edema with mild tenderness.  They have not been much different from his baseline per his wife.  LABORATORY PANEL:   CBC Recent Labs  Lab 02/08/22 1919  WBC 5.6  HGB 11.4*  HCT 37.3*  PLT 176   ------------------------------------------------------------------------------------------------------------------  Chemistries  Recent Labs  Lab 02/08/22 1919  NA 142  K 4.7  CL 105  CO2 29  GLUCOSE 109*  BUN 41*  CREATININE 2.69*  CALCIUM 9.0  AST 18  ALT 13  ALKPHOS 75  BILITOT 1.0   ------------------------------------------------------------------------------------------------------------------  Cardiac Enzymes No results for input(s): "TROPONINI" in the last 168 hours. ------------------------------------------------------------------------------------------------------------------  RADIOLOGY:  US Abdomen Limited  Result Date: 02/09/2022 CLINICAL DATA:  Evaluate for ascites EXAM: LIMITED ABDOMEN ULTRASOUND FOR ASCITES TECHNIQUE: Limited ultrasound survey for ascites was performed in all four abdominal quadrants. COMPARISON:  None Available. FINDINGS: No ascites seen within the abdomen.  Right pleural effusion noted. IMPRESSION: No visible ascites. Right pleural effusion. Electronically Signed   By: Rolm Baptise M.D.   On: 02/09/2022 01:27   DG Chest 2 View  Result Date: 02/08/2022 CLINICAL DATA:  Shortness of breath.  Peripheral edema. EXAM: CHEST - 2 VIEW COMPARISON:  04/29/2019  FINDINGS: Stable mild cardiomegaly. Diffuse interstitial infiltrates are suspicious for mild interstitial edema. No evidence of pulmonary consolidation or pleural effusion. IMPRESSION: Mild diffuse interstitial infiltrates, suspicious for mild interstitial edema. Electronically Signed   By: Marlaine Hind M.D.   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 - We will continue diuresis with IV Lasix. - We will monitor daily weights and accurate I's and O's. - We will follow serial troponins. - Last 2D echo revealed an EF of 55 to 60% in  2021 with mild left ventricular hypertrophy and indeterminate diastolic function. - We will obtain a 2D echo as planned by [name]. - Cardiology consult will be obtained to [facility]. - I notified Dr. Oval Linsey.  Type 2 diabetes mellitus with stage 4 chronic kidney disease, with long-term current use of insulin (HCC) - We will continue basal coverage with Novolin N and Glucotrol XL. - The patient will be placed on supplement coverage with NovoLog.  Paroxysmal atrial fibrillation (HCC) - We will continue Eliquis.  Depression We will continue Zoloft  Dyslipidemia We will continue statin therapy.   DVT prophylaxis: Lovenox.  Advanced Care Planning:  Code Status: full code.  Family Communication:  The plan of care was discussed in details with the patient (and family). I answered all questions. The patient agreed to proceed with the above mentioned plan. Further management will depend upon hospital course. Disposition Plan: Back to previous home environment Consults called: none.  All the records are reviewed and case discussed with ED provider.  Status is: Inpatient   At the time of the admission, it appears that the appropriate admission status for this patient is inpatient.  This is judged to be reasonable and  necessary in order to provide the required intensity of service to ensure the patient's safety given the presenting symptoms, physical exam findings and initial radiographic and laboratory data in the context of comorbid conditions.  The patient requires inpatient status due to high intensity of service, high risk of further deterioration and high frequency of surveillance required.  I certify that at the time of admission, it is my clinical judgment that the patient will require inpatient hospital care extending more than 2 midnights.                            Dispo: The patient is from: Home              Anticipated d/c is to: Home              Patient currently is not medically stable to d/c.              Difficult to place patient: No  Christel Mormon M.D on 02/09/2022 at 5:01 AM  [facility]   From 7 PM-7 AM, contact night-coverage [email or web address]  CC: Primary care physician; Glean Hess, MD

## 2022-02-09 NOTE — Assessment & Plan Note (Signed)
Blood pressure currently elevated.  Patient is on multiple medications at home. -Continue home medications which include hydralazine, Imdur, Cardura and diuretics

## 2022-02-09 NOTE — Hospital Course (Addendum)
Taken from H&P.  Kyle Roberson is a 81 y.o. male with medical history significant for coronary artery disease, type 2 diabetes mellitus, GERD, hypertension and dyslipidemia, who presented to the emergency room with  abdominal distention and weight gain of more than 20 pounds over the last couple of weeks.  The patient has been getting subcutaneous and p.o. Lasix as well as Zaroxolyn.  Patient is also experiencing worsening dyspnea, orthopnea and PND.  ED course.  Blood pressure 153/73, otherwise normal vitals.  Labs pertinent for BUN of 41 and creatinine 2.69 which is at her baseline.  BNP 263, troponin 22>>21. EKG showed junctional rhythm with a rate of 57, right axis deviation, incomplete right bundle branch block and low voltage. Chest x-ray with mild diffuse interstitial infiltrate suspicious for mild station edema.  Patient received 80 mg of IV Lasix, [facility] cardiology was also consulted.  11/20: Vitals with blood pressure 154/65, urinary output of 1550 recorded, BMP with BUN of 41 and creatinine of 2.51.  Pending repeat echocardiogram. Discontinuing IV fluid and starting him on SSI.  11/21: Vitals stable, echocardiogram with 55 to 60% of EF, no regional wall motion abnormalities, indeterminate diastolic function.  Elevated right ventricular pressure.  Severe pulmonary hypertension and mild AR.  UOP recorded of about 2300 with net negative of -3 L. Renal function with small improvement.  Weight recorded today at 117 kg.

## 2022-02-09 NOTE — Consult Note (Signed)
Cardiology Consult    Patient ID: Kyle Roberson MRN: [id number], DOB/AGE: 83-Apr-1942   Admit date: 02/09/2022 Date of Consult: 02/09/2022  Primary Physician: Glean Hess, MD Primary Cardiologist: Ida Rogue, MD Requesting Provider: Chauncey Cruel. Reesa Chew, MD  Patient Profile    Kyle Roberson is a 81 y.o. male with a history of CAD status post remote LAD and diagonal PCI, chronic HFpEF, stage IV chronic kidney disease, hypertension, hyperlipidemia, pulmonary arterial hypertension, permanent atrial fibrillation, traumatic subdural hematoma status post craniotomy November 2022, and obesity, who is being seen today for the evaluation of acute on chronic HFpEF at the request of Dr. Reesa Chew.  Past Medical History   Past Medical History:  Diagnosis Date   AKI (acute kidney injury) ([place]) 07/06/2018   CAD (coronary artery disease)    a. Remote PCI/stenting to LAD w PTCA Diagnoal. RCA 60%; b.  2005/2008 Cardiolites w/ reportedly mild ischemia in Diag territory-->Med rx.   Cellulitis of lower extremity 05/31/2019   Chronic heart failure with preserved ejection fraction (HFpEF) ([place])    a. 04/2019 Echo: EF 55-60%, no rwma, nl RV fxn, RVSP 55.46mmHg. Mod dil LA. Mild-mod MR. Mod dil PA.   CKD (chronic kidney disease), stage IV (HCC)    Diabetes (HCC)    GERD (gastroesophageal reflux disease)    History of MI (myocardial infarction) 06/27/2014   HLD (hyperlipidemia)    HTN (hypertension)    PAH (pulmonary artery hypertension) ([place])    a. 04/2019 Echo: RVSP 55.14mmHg.   Permanent atrial fibrillation (HCC)    a. CHA2DS2VASc = 5-->dose adjusted eliquis (age/creat).   Subdural hematoma ([place])    a. 01/2021 in setting of fall s/p L frontotemporal craniotomy.    Past Surgical History:  Procedure Laterality Date   BLEPHAROPLASTY     CARDIAC CATHETERIZATION     CORONARY STENT INTERVENTION     CRANIOTOMY Left 02/04/2021   Procedure: CRANIOTOMY FOR LEFT SUBDURAL  HEMATOMA EVACUATION;  Surgeon:  Deetta Perla, MD;  Location: [facility] ORS;  Service: Neurosurgery;  Laterality: Left;   CYSTOSCOPY     HERNIA REPAIR       Allergies  Allergies  Allergen Reactions   Atorvastatin Hives, Itching and Other (See Comments)    Other reaction(s): Other (See Comments)   Simvastatin Hives, Itching and Other (See Comments)    Other reaction(s): Other (See Comments)    History of Present Illness    81 year old male with the above past medical history including CAD, chronic HFpEF, stage IV chronic kidney disease, hypertension, hyperlipidemia, pulmonary hypertension, permanent atrial fibrillation, traumatic subdural hematoma status post craniotomy in November 2022, and obesity.  As noted above, he previously underwent stenting of the LAD with diagonal to the PCI.  Catheterization at that time showed 6% residual RCA stenosis.  He subsequently underwent stress testing in 2005 and 2008, which reportedly showed ischemia in the diagonal area.  He has been medically managed since.  Most recent echo in February 2021 showed an EF of 55 to 60% with an RVSP of 55.7 mmHg and mild to moderate MR.  In the setting of persistent/permanent atrial fibrillation, he has been anticoagulated with Eliquis.  In November 2022, he suffered a traumatic fall resulting in a subdural hematoma requiring left frontotemporal craniotomy.  Following clearance from the neurosurgical team, low-dose Eliquis was resumed in the setting of advanced age and creatinine greater than 1.5.  Kyle Roberson was seen in cardiology clinic on October 16 and was noted to be up 19 kg from December  2022.  He was markedly volume overloaded on examination and recommendation was for admission however, patient declined.  He was subsequently prescribed subcutaneous Lasix to be used at home on an every other day basis, as well as metolazone 3 times a week.  Patient noted that he had improved urine output but weights have only continued to increase as has orthopnea,  abdominal and lower extremity edema, and dyspnea with minimal activity.  As result, he presented to the emergency department on November 19.  Weight recorded at 123.4 kg, up from 100 kg in December 2022.  Rate controlled atrial fibrillation on ECG without acute changes.  BNP 263.5.  High-sensitivity troponin minimally elevated at 22  21.  Chest x-ray notable for interstitial infiltrates and edema.  He was markedly volume overloaded on examination has been placed on intravenous Lasix.  At rest, patient is in no distress.  Creatinine has improved from 2.69 on arrival to 2.51 this morning.  Patient notes that he had approximately 2 L out yesterday.  He remains markedly volume overloaded.  He denies any history of chest pain.  Inpatient Medications     apixaban  2.5 mg Oral BID   ascorbic acid  500 mg Oral Daily   cholecalciferol  1,000 Units Oral Daily   dorzolamide-timolol  1 drop Left Eye BID   doxazosin  1 mg Oral BID   furosemide  80 mg Intravenous BID   glipiZIDE  10 mg Oral Q breakfast   hydrALAZINE  100 mg Oral TID   insulin aspart  0-15 Units Subcutaneous TID WC   insulin aspart  0-5 Units Subcutaneous QHS   metolazone  5 mg Oral Once per day on Mon Wed Fri   multivitamin with minerals  1 tablet Oral Daily   omega-3 acid ethyl esters  1 g Oral Daily   rosuvastatin  5 mg Oral Once per day on Mon Wed Fri    Family History    Family History  Problem Relation Age of Onset   Pancreatic cancer Mother    CAD Father    Diabetes Brother    He indicated that his mother is deceased. He indicated that his father is deceased. He indicated that his sister is alive. He indicated that his brother is alive.   Social History    Social History   Socioeconomic History   Marital status: Married    Spouse name: Not on file   Number of children: Not on file   Years of education: Not on file   Highest education level: Not on file  Occupational History   Not on file  Tobacco Use   Smoking  status: Former   Smokeless tobacco: Never  Vaping Use   Vaping Use: Never used  Substance and Sexual Activity   Alcohol use: Not Currently    Alcohol/week: 2.0 standard drinks of alcohol    Types: 2 Glasses of wine per week   Drug use: Not Currently   Sexual activity: Not on file  Other Topics Concern   Not on file  Social History Narrative   Lives at home with wife   Social Determinants of Health   Financial Resource Strain: Not on file  Food Insecurity: Not on file  Transportation Needs: Not on file  Physical Activity: Not on file  Stress: Not on file  Social Connections: Not on file  Intimate Partner Violence: Not on file     Review of Systems    General:  No chills, fever, night  sweats or weight changes.  Cardiovascular:  No chest pain, +++ dyspnea on exertion, +++ lower extremity and abdominal edema, +++ orthopnea -sleeps in recliner, no palpitations, paroxysmal nocturnal dyspnea. Dermatological: No rash, lesions/masses Respiratory: No cough, +++ dyspnea Urologic: No hematuria, dysuria Abdominal:   Diffuse abdominal wall tenderness in the setting of marked edema.  No nausea, vomiting, diarrhea, bright red blood per rectum, melena, or hematemesis Neurologic:  No visual changes, wkns, changes in mental status. All other systems reviewed and are otherwise negative except as noted above.  Physical Exam    Blood pressure (!) 155/67, pulse 69, temperature 98.4 F (36.9 C), temperature source Oral, resp. rate 16, height 5\' 8"  (1.727 m), weight 123.4 kg, SpO2 94 %.  General: Pleasant, NAD Psych: Normal affect. Neuro: Alert and oriented X 3. Moves all extremities spontaneously. HEENT: Normal  Neck: Supple without bruits.  JVD to earlobe. Lungs:  Resp regular and unlabored, diminished breath sounds at the right base with bibasilar crackles Heart: Irregularly irregular, distant, no s3, s4, or murmurs. Abdomen: Obese, 2+ bilateral abdominal and flank edema with associated  tenderness.  Bowel sounds present x4.   Extremities: No clubbing, cyanosis.  3+ bilateral lower extremity edema to the lower posterior thighs and 1-2+ edema of the hips/buttocks. Radials 2+ and equal bilaterally.  Labs    Cardiac Enzymes Recent Labs  Lab 02/08/22 1919 02/08/22 2210  TROPONINIHS 22* 21*     BNP    Component Value Date/Time   BNP 263.5 (H) 02/08/2022 1919    Lab Results  Component Value Date   WBC 6.0 02/09/2022   HGB 10.6 (L) 02/09/2022   HCT 33.9 (L) 02/09/2022   MCV 93.9 02/09/2022   PLT 155 02/09/2022    Recent Labs  Lab 02/08/22 1919 02/09/22 0525  NA 142 143  K 4.7 4.1  CL 105 108  CO2 29 27  BUN 41* 41*  CREATININE 2.69* 2.51*  CALCIUM 9.0 8.7*  PROT 7.1  --   BILITOT 1.0  --   ALKPHOS 75  --   ALT 13  --   AST 18  --   GLUCOSE 109* 168*   Lab Results  Component Value Date   CHOL 121 05/05/2021   HDL 34 (L) 05/05/2021   LDLCALC 68 05/05/2021   TRIG 95 05/05/2021      Radiology Studies    US Abdomen Limited  Result Date: 02/09/2022 CLINICAL DATA:  Evaluate for ascites EXAM: LIMITED ABDOMEN ULTRASOUND FOR ASCITES TECHNIQUE: Limited ultrasound survey for ascites was performed in all four abdominal quadrants. COMPARISON:  None Available. FINDINGS: No ascites seen within the abdomen.  Right pleural effusion noted. IMPRESSION: No visible ascites. Right pleural effusion. Electronically Signed   By: Rolm Baptise M.D.   On: 02/09/2022 01:27   DG Chest 2 View  Result Date: 02/08/2022 CLINICAL DATA:  Shortness of breath.  Peripheral edema. EXAM: CHEST - 2 VIEW COMPARISON:  04/29/2019 FINDINGS: Stable mild cardiomegaly. Diffuse interstitial infiltrates are suspicious for mild interstitial edema. No evidence of pulmonary consolidation or pleural effusion. IMPRESSION: Mild diffuse interstitial infiltrates, suspicious for mild interstitial edema. Electronically Signed   By: Marlaine Hind M.D.   On: 02/08/2022 19:37    ECG & Cardiac Imaging     Afib, 57, RAD, inc RBBB - personally reviewed.  Assessment & Plan    1.  Acute on chronic HFpEF/pulmonary arterial hypertension: Echo in February 2021 showed an EF of 55 to 60%, with RVSP of 55.7 mmHg.  Patient seen in clinic on October 16 and was noted to be up 19 kg since December 2022 and markedly volume overloaded.  Patient refused admission at that time and was placed on subcutaneous Lasix and metolazone in the outpatient setting.  Despite noting good urine output, his weight is only gone up and his lower extremity swelling, abdominal swelling, dyspnea, and orthopnea have only worsened.  He presented to the emergency department on November 19 due to all of the above.  He is markedly volume overloaded on examination.  Creatinine 2.69 on arrival and down to 2.51 with diuresis.  Continue aggressive IV diuresis and consider escalation, currently receiving Lasix 80 mg IV twice daily.  Will likely require nephrology input.  Echo pending.  2.  Supply:Demand Ischemia/coronary artery disease: Status post prior LAD stenting and PTCA of the diagonal with residual 60% RCA disease per cardiology notes.  Patient has not had any recent ischemic evaluation/cath.  He denies any recent chest pain.  Troponin minimally elevated to a peak of 22 in the setting of massive volume overload.  Echo pending.  Provided that EF remains normal, would not pursue ischemic evaluation at this time.  3.  Essential hypertension: Blood pressure currently elevated.  Follow with diuresis.  4.  Stage IV chronic kidney disease: Follow with diuresis.  5.  Hyperlipidemia: LDL of 68 in February.  Continue statin therapy.  6.  Permanent atrial fibrillation: Well rate controlled in the absence of AV nodal blocking agents.  He remains on dose adjusted Eliquis in the setting of kidney disease and age greater than 81.  7.  Normocytic anemia: Relatively stable.  8.  History of traumatic subdural hematoma: Status post left frontotemporal  craniotomy in 2022.  No residual deficits and subsequently has resumed anticoagulation.  Risk Assessment/Risk Scores:        New York Heart Association (NYHA) Functional Class NYHA Class IV  CHA2DS2-VASc Score = 6   This indicates a 9.7% annual risk of stroke. The patient's score is based upon: CHF History: 1 HTN History: 1 Diabetes History: 1 Stroke History: 0 Vascular Disease History: 1 Age Score: 2 Gender Score: 0     Signed, Murray Hodgkins, NP 02/09/2022, 11:11 AM  For questions or updates, please contact   Please consult [email or web address] for contact info under Cardiology/STEMI.

## 2022-02-09 NOTE — Progress Notes (Addendum)
PHARMACIST - PHYSICIAN ORDER COMMUNICATION  CONCERNING: P&T Medication Policy on Herbal Medications  DESCRIPTION:  This patient's order(s) for: Coenzyme Q10, Flaxseed Oil & Tumeric has been noted.  This product(s) is classified as an "herbal" or natural product. Due to a lack of definitive safety studies or FDA approval, nonstandard manufacturing practices, plus the potential risk of unknown drug-drug interactions while on inpatient medications, the Pharmacy and Therapeutics Committee does not permit the use of "herbal" or natural products of this type within [facility].   ACTION TAKEN: The pharmacy department is unable to verify this order at this time.  Please reevaluate patient's clinical condition at discharge and address if the herbal or natural product(s) should be resumed at that time.  Renda Rolls, PharmD, [facility] 02/09/2022 3:51 AM

## 2022-02-09 NOTE — Assessment & Plan Note (Signed)
Creatinine currently stable around baseline. -Monitor renal function while he is being diuresed

## 2022-02-09 NOTE — Assessment & Plan Note (Addendum)
Last 2D echo revealed an EF of 55 to 60% in 2021 with mild left ventricular hypertrophy and indeterminate diastolic function.  Repeat echocardiogram pending. - Continue diuresis with IV Lasix. - Monitor daily weights and accurate I's and O's. -Monitor BMP while he is being diuresed. -Cardiology was consulted-appreciate their help

## 2022-02-09 NOTE — Assessment & Plan Note (Signed)
Estimated body mass index is 41.36 kg/m as calculated from the following:   Height as of this encounter: 5\' 8"  (1.727 m).   Weight as of this encounter: 123.4 kg.   -This will complicate overall prognosis

## 2022-02-09 NOTE — Discharge Instructions (Signed)

## 2022-02-09 NOTE — ED Provider Notes (Signed)
[facility] Provider Note    Event Date/Time   First MD Initiated Contact with Patient 02/09/22 0014     (approximate)   History   Leg Swelling   HPI  Kyle Roberson is a 81 y.o. male who comes in with family.  They report increasing abdominal girth and weight has gone up by 20 pounds in the last couple weeks.  Patient is doing subcu Lasix 80 with furosemide p.o. and Zaroxolyn p.o.  Patient has good results with this but weight is continuing to increase.  Patient seems to be getting a little more short of breath than he had been.      Physical Exam   Triage Vital Signs: ED Triage Vitals  Enc Vitals Group     BP 02/08/22 1909 (!) 153/72     Pulse Rate 02/08/22 1909 (!) 56     Resp 02/08/22 1909 20     Temp 02/08/22 1909 98.5 F (36.9 C)     Temp Source 02/08/22 1909 Oral     SpO2 02/08/22 1909 95 %     Weight 02/08/22 1910 272 lb (123.4 kg)     Height 02/08/22 1910 5\' 8"  (1.727 m)     Head Circumference --      Peak Flow --      Pain Score 02/08/22 1921 0     Pain Loc --      Pain Edu? --      Excl. in [place]? --     Most recent vital signs: Vitals:   02/09/22 0130 02/09/22 0300  BP: (!) 149/75 (!) 160/75  Pulse: 71 61  Resp: (!) 21 20  Temp: 98.4 F (36.9 C)   SpO2: 97% 96%    General: Awake, no distress.  CV:  Good peripheral perfusion.  Heart regular rate and rhythm no audible murmurs Resp:  Normal effort.  Slight decreased air movement in the bases Abd:  Distended mildly nontender.  Reportedly belly is increased in girth Extremities with marked edema   ED Results / Procedures / Treatments   Labs (all labs ordered are listed, but only abnormal results are displayed) Labs Reviewed  CBC WITH DIFFERENTIAL/PLATELET - Abnormal; Notable for the following components:      Result Value   RBC 3.99 (*)    Hemoglobin 11.4 (*)    HCT 37.3 (*)    RDW 16.1 (*)    Lymphs Abs 0.5 (*)    All other components within normal limits   BRAIN NATRIURETIC PEPTIDE - Abnormal; Notable for the following components:   B Natriuretic Peptide 263.5 (*)    All other components within normal limits  COMPREHENSIVE METABOLIC PANEL - Abnormal; Notable for the following components:   Glucose, Bld 109 (*)    BUN 41 (*)    Creatinine, Ser 2.69 (*)    GFR, Estimated 23 (*)    All other components within normal limits  TROPONIN I (HIGH SENSITIVITY) - Abnormal; Notable for the following components:   Troponin I (High Sensitivity) 22 (*)    All other components within normal limits  TROPONIN I (HIGH SENSITIVITY) - Abnormal; Notable for the following components:   Troponin I (High Sensitivity) 21 (*)    All other components within normal limits     EKG  EKG read and interpreted by me shows bradycardia cardia at a rate of 57 possible A-fib.  EKG looks very similar to prior EKG rightward axis no acute changes   RADIOLOGY Chest  x-ray read by radiology reviewed and interpreted by me shows some stable mild cardiomegaly with some increase in vascular markings suggestive of CHF.  PROCEDURES:  Critical Care performed:   Procedures   MEDICATIONS ORDERED IN ED: Medications  apixaban (ELIQUIS) tablet 2.5 mg (has no administration in time range)  isosorbide mononitrate (IMDUR) 24 hr tablet 90 mg (has no administration in time range)  hydrALAZINE (APRESOLINE) tablet 100 mg (has no administration in time range)  furosemide (LASIX) injection 80 mg (80 mg Intravenous Given 02/09/22 0050)     IMPRESSION / MDM / ASSESSMENT AND PLAN / ED COURSE  I reviewed the triage vital signs and the nursing notes. ----------------------------------------- 3:39 AM on 02/09/2022 ----------------------------------------- Patient's put out almost a liter at this point.  He is not really feeling better though and his family member reports that they are very worried that he will just get worse again if he goes home because his medicines at home have not been  working.  They have been following the regime exactly and he still gaining weight.  I spoke with the hospitalist will get him in the hospital.  Patient with chronic edema and chronic leg swelling.  Lymphedema could be caused by lymphatic obstruction or by congestive failure or by being up on his feet chronically without enough exercise to have his muscles pump the lymphatic fluid up.  There is no sign of cellulitis which could also cause leg swelling.  Patient's presentation is most consistent with acute presentation with potential threat to life or bodily function.  The patient is on the cardiac monitor to evaluate for evidence of arrhythmia and/or significant heart rate changes.  None have been seen      FINAL CLINICAL IMPRESSION(S) / ED DIAGNOSES   Final diagnoses:  Systolic congestive heart failure, unspecified HF chronicity ([place])     Rx / DC Orders   ED Discharge Orders     None        Note:  This document was prepared using Dragon voice recognition software and may include unintentional dictation errors.   Nena Polio, MD 02/09/22 [phone]

## 2022-02-09 NOTE — Assessment & Plan Note (Signed)
-   We will continue Zoloft 

## 2022-02-09 NOTE — ED Notes (Signed)
Assumed care of pt, pt taken to room 36 and transferred to IP bed. Pt positioned in bed for comfort and connected to monitor. VSS, NAD at this time. Purewick connected. Pt requesting water but denies other needs. Pt NPO at this time, will speak with provider. Call light is in reach, [facility].

## 2022-02-09 NOTE — Assessment & Plan Note (Addendum)
Rate well controlled without any rate controlling medications - We will continue Eliquis.

## 2022-02-09 NOTE — Assessment & Plan Note (Addendum)
Patient was on glipizide at home. -SSI

## 2022-02-09 NOTE — Progress Notes (Signed)
*  PRELIMINARY RESULTS* Echocardiogram 2D Echocardiogram has been performed.  Kyle Roberson 02/09/2022, 1:18 PM

## 2022-02-09 NOTE — Progress Notes (Signed)
Progress Note   Patient: Kyle Roberson BPZ:[id number] DOB: 1940/11/20 DOA: 02/09/2022     0 DOS: the patient was seen and examined on 02/09/2022   Brief hospital course: Taken from H&P.  Aaidyn San is a 81 y.o. male with medical history significant for coronary artery disease, type 2 diabetes mellitus, GERD, hypertension and dyslipidemia, who presented to the emergency room with  abdominal distention and weight gain of more than 20 pounds over the last couple of weeks.  The patient has been getting subcutaneous and p.o. Lasix as well as Zaroxolyn.  Patient is also experiencing worsening dyspnea, orthopnea and PND.  ED course.  Blood pressure 153/73, otherwise normal vitals.  Labs pertinent for BUN of 41 and creatinine 2.69 which is at her baseline.  BNP 263, troponin 22>>21. EKG showed junctional rhythm with a rate of 57, right axis deviation, incomplete right bundle branch block and low voltage. Chest x-ray with mild diffuse interstitial infiltrate suspicious for mild station edema.  Patient received 80 mg of IV Lasix, [facility] cardiology was also consulted.  11/20: Vitals with blood pressure 154/65, urinary output of 1550 recorded, BMP with BUN of 41 and creatinine of 2.51.  Pending repeat echocardiogram. Discontinuing IV fluid and starting him on SSI.    Assessment and Plan: * Acute on chronic diastolic (congestive) heart failure (HCC) Last 2D echo revealed an EF of 55 to 60% in 2021 with mild left ventricular hypertrophy and indeterminate diastolic function.  Repeat echocardiogram pending. - Continue diuresis with IV Lasix. - Monitor daily weights and accurate I's and O's. -Monitor BMP while he is being diuresed. -Cardiology was consulted-appreciate their help  Elevated troponin Most likely secondary to demand ischemia with acute on chronic diastolic heart failure and severe pulmonary hypertension. -Continue to monitor  Type 2 diabetes mellitus with stage 4 chronic  kidney disease, with long-term current use of insulin ([place]) Patient was on glipizide at home. -SSI  Paroxysmal atrial fibrillation (HCC) Rate well controlled without any rate controlling medications - We will continue Eliquis.  CKD (chronic kidney disease) stage 4, GFR 15-29 ml/min (HCC) Creatinine currently stable around baseline. -Monitor renal function while he is being diuresed  Depression We will continue Zoloft  Morbid obesity ([place]) Estimated body mass index is 41.36 kg/m as calculated from the following:   Height as of this encounter: 5\' 8"  (1.727 m).   Weight as of this encounter: 123.4 kg.   -This will complicate overall prognosis  Essential hypertension Blood pressure currently elevated.  Patient is on multiple medications at home. -Continue home medications which include hydralazine, Imdur, Cardura and diuretics  Dyslipidemia We will continue statin therapy.   Subjective: Patient continued to have some shortness of breath.  No chest pain  Physical Exam: Vitals:   02/09/22 0900 02/09/22 0936 02/09/22 1200 02/09/22 1300  BP: (!) 155/67  (!) 159/75 (!) 153/69  Pulse: 69  (!) 58 66  Resp: 16  (!) 22 17  Temp:  98.4 F (36.9 C)  98.6 F (37 C)  TempSrc:  Oral    SpO2: 94%  (!) 89% 99%  Weight:      Height:       General.  Ill-appearing, obese gentleman, in no acute distress. Pulmonary.  Lungs clear bilaterally, normal respiratory effort.  Decreased breath sound at bases. CV.  Regular rate and rhythm, no JVD, rub or murmur. Abdomen.  Soft, nontender, nondistended, BS positive. CNS.  Alert and oriented .  No focal neurologic deficit. Extremities.  2+ LE  edema with signs of chronic venous congestion Psychiatry.  Judgment and insight appears normal.   Data Reviewed: Prior data reviewed  Family Communication: Talked with significant other on phone.  Disposition: Status is: Inpatient Remains inpatient appropriate because: Severity of illness.  Planned  Discharge Destination: Home  DVT prophylaxis. Eliquis Time spent:  minutes  This record has been created using Systems analyst. Errors have been sought and corrected,but may not always be located. Such creation errors do not reflect on the standard of care.   Author: Lorella Nimrod, MD 02/09/2022 3:16 PM  For on call review [email or web address].

## 2022-02-10 ENCOUNTER — Ambulatory Visit: Payer: Medicare HMO | Admitting: Internal Medicine

## 2022-02-10 DIAGNOSIS — I5033 Acute on chronic diastolic (congestive) heart failure: Secondary | ICD-10-CM | POA: Diagnosis not present

## 2022-02-10 LAB — BASIC METABOLIC PANEL
Anion gap: 7 (ref 5–15)
BUN: 37 mg/dL — ABNORMAL HIGH (ref 8–23)
CO2: 32 mmol/L (ref 22–32)
Calcium: 9.1 mg/dL (ref 8.9–10.3)
Chloride: 105 mmol/L (ref 98–111)
Creatinine, Ser: 2.42 mg/dL — ABNORMAL HIGH (ref 0.61–1.24)
GFR, Estimated: 26 mL/min — ABNORMAL LOW (ref >60)
Glucose, Bld: 92 mg/dL (ref 70–99)
Potassium: 4.3 mmol/L (ref 3.5–5.1)
Sodium: 144 mmol/L (ref 135–145)

## 2022-02-10 LAB — CBC
HCT: 36.6 % — ABNORMAL LOW (ref 39.0–52.0)
Hemoglobin: 11.1 g/dL — ABNORMAL LOW (ref 13.0–17.0)
MCH: 28.2 pg (ref 26.0–34.0)
MCHC: 30.3 g/dL (ref 30.0–36.0)
MCV: 93.1 fL (ref 80.0–100.0)
Platelets: 176 10*3/uL (ref 150–400)
RBC: 3.93 MIL/uL — ABNORMAL LOW (ref 4.22–5.81)
RDW: 16 % — ABNORMAL HIGH (ref 11.5–15.5)
WBC: 6.4 10*3/uL (ref 4.0–10.5)
nRBC: 0 % (ref 0.0–0.2)

## 2022-02-10 LAB — GLUCOSE, CAPILLARY
Glucose-Capillary: 125 mg/dL — ABNORMAL HIGH (ref 70–99)
Glucose-Capillary: 139 mg/dL — ABNORMAL HIGH (ref 70–99)
Glucose-Capillary: 194 mg/dL — ABNORMAL HIGH (ref 70–99)
Glucose-Capillary: 67 mg/dL — ABNORMAL LOW (ref 70–99)
Glucose-Capillary: 93 mg/dL (ref 70–99)

## 2022-02-10 MED ORDER — METOLAZONE 5 MG PO TABS: 5.0000 mg | ORAL_TABLET | Freq: Every day | ORAL | Status: DC

## 2022-02-10 MED ORDER — IPRATROPIUM-ALBUTEROL 0.5-2.5 (3) MG/3ML IN SOLN
3.0000 mL | Freq: Four times a day (QID) | RESPIRATORY_TRACT | Status: DC
  Administered 2022-02-10 – 2022-02-11 (×5): 3 mL via RESPIRATORY_TRACT
  Filled 2022-02-10 (×5): qty 3

## 2022-02-10 MED ORDER — IPRATROPIUM-ALBUTEROL 0.5-2.5 (3) MG/3ML IN SOLN: 3.0000 mL | RESPIRATORY_TRACT | Status: DC | PRN

## 2022-02-10 MED ORDER — METOLAZONE 5 MG PO TABS
5.0000 mg | ORAL_TABLET | Freq: Every day | ORAL | Status: DC
  Administered 2022-02-10 – 2022-02-13 (×4): 5 mg via ORAL
  Filled 2022-02-10 (×6): qty 1

## 2022-02-10 NOTE — Progress Notes (Signed)
Cardiology Progress Note   Patient Name: Kyle Roberson Date of Encounter: 02/10/2022  Primary Cardiologist: Ida Rogue, MD  Subjective   Diuresed well yesterday.  No noticeable difference from his perspective.  No dyspnea at rest.  No chest pain.  Inpatient Medications    Scheduled Meds:  apixaban  2.5 mg Oral BID   ascorbic acid  500 mg Oral Daily   cholecalciferol  1,000 Units Oral Daily   dorzolamide-timolol  1 drop Left Eye BID   doxazosin  1 mg Oral BID   furosemide  80 mg Intravenous BID   glipiZIDE  10 mg Oral Q breakfast   hydrALAZINE  100 mg Oral TID   insulin aspart  0-15 Units Subcutaneous TID WC   insulin aspart  0-5 Units Subcutaneous QHS   metolazone  5 mg Oral Daily   multivitamin with minerals  1 tablet Oral Daily   omega-3 acid ethyl esters  1 g Oral Daily   rosuvastatin  5 mg Oral Once per day on Mon Wed Fri   Continuous Infusions:  PRN Meds: acetaminophen **OR** acetaminophen, magnesium hydroxide, ondansetron **OR** ondansetron (ZOFRAN) IV, potassium chloride SA, traZODone   Vital Signs    Vitals:   02/09/22 2100 02/09/22 2334 02/10/22 0358 02/10/22 0759  BP:  136/77 (!) 147/74 (!) 147/61  Pulse:  (!) 55 (!) 57 64  Resp:  20 20 (!) 24  Temp: 97.9 F (36.6 C) 97.9 F (36.6 C) 97.7 F (36.5 C) 97.9 F (36.6 C)  TempSrc: Oral Oral Oral Oral  SpO2: 96% 95% 94% 92%  Weight:   117.4 kg   Height:        Intake/Output Summary (Last 24 hours) at 02/10/2022 1026 Last data filed at 02/10/2022 0800 Gross per 24 hour  Intake 238 ml  Output 3000 ml  Net -2762 ml   Filed Weights   02/08/22 1910 02/10/22 0358  Weight: 123.4 kg 117.4 kg    Physical Exam   GEN: Well nourished, well developed, in no acute distress.  HEENT: Grossly normal.  Neck: Supple, JVD to earlobe. No bruits. Cardiac: IR, IR, distant, no murmurs, rubs, or gallops. No clubbing, cyanosis, 3+ bilat LEE w/ hip/flank edema. Respiratory:  Respirations regular and  unlabored, diminished breath sounds, rhonchi, bibasilar crackles. GI: Obese, semi-firm, edematous, diffusely tender, BS + x 4. MS: no deformity or atrophy. Skin: warm and dry, no rash. Neuro:  Strength and sensation are intact. Psych: AAOx3.  Normal affect.  Labs    Chemistry Recent Labs  Lab 02/08/22 1919 02/09/22 0525 02/10/22 0517  NA 142 143 144  K 4.7 4.1 4.3  CL 105 108 105  CO2 29 27 32  GLUCOSE 109* 168* 92  BUN 41* 41* 37*  CREATININE 2.69* 2.51* 2.42*  CALCIUM 9.0 8.7* 9.1  PROT 7.1  --   --   ALBUMIN 3.8  --   --   AST 18  --   --   ALT 13  --   --   ALKPHOS 75  --   --   BILITOT 1.0  --   --   GFRNONAA 23* 25* 26*  ANIONGAP 8 8 7      Hematology Recent Labs  Lab 02/08/22 1919 02/09/22 0525 02/10/22 0517  WBC 5.6 6.0 6.4  RBC 3.99* 3.61* 3.93*  HGB 11.4* 10.6* 11.1*  HCT 37.3* 33.9* 36.6*  MCV 93.5 93.9 93.1  MCH 28.6 29.4 28.2  MCHC 30.6 31.3 30.3  RDW 16.1* 16.1* 16.0*  PLT 176 155 176    Cardiac Enzymes  Recent Labs  Lab 02/08/22 1919 02/08/22 2210  TROPONINIHS 22* 21*      BNP    Component Value Date/Time   BNP 263.5 (H) 02/08/2022 1919   Lipids  Lab Results  Component Value Date   CHOL 121 05/05/2021   HDL 34 (L) 05/05/2021   LDLCALC 68 05/05/2021   TRIG 95 05/05/2021   CHOLHDL 3.6 05/05/2021    HbA1c  Lab Results  Component Value Date   HGBA1C 7.7 (H) 12/02/2021    Radiology    US Abdomen Limited  Result Date: 02/09/2022 CLINICAL DATA:  Evaluate for ascites EXAM: LIMITED ABDOMEN ULTRASOUND FOR ASCITES TECHNIQUE: Limited ultrasound survey for ascites was performed in all four abdominal quadrants. COMPARISON:  None Available. FINDINGS: No ascites seen within the abdomen.  Right pleural effusion noted. IMPRESSION: No visible ascites. Right pleural effusion. Electronically Signed   By: Rolm Baptise M.D.   On: 02/09/2022 01:27   DG Chest 2 View  Result Date: 02/08/2022 CLINICAL DATA:  Shortness of breath.  Peripheral  edema. EXAM: CHEST - 2 VIEW COMPARISON:  04/29/2019 FINDINGS: Stable mild cardiomegaly. Diffuse interstitial infiltrates are suspicious for mild interstitial edema. No evidence of pulmonary consolidation or pleural effusion. IMPRESSION: Mild diffuse interstitial infiltrates, suspicious for mild interstitial edema. Electronically Signed   By: Marlaine Hind M.D.   On: 02/08/2022 19:37    Telemetry    Afib, 50's to 60's, occas PVC - Personally Reviewed  Cardiac Studies   2D Echocardiogram 11.20.203   1. Left ventricular ejection fraction, by estimation, is 55 to 60%. The  left ventricle has normal function. The left ventricle has no regional  wall motion abnormalities. The left ventricular internal cavity size was  mildly dilated. There is mild left  ventricular hypertrophy. Left ventricular diastolic parameters are  indeterminate. There is the interventricular septum is flattened in  systole, consistent with right ventricular pressure overload.   2. Right ventricular systolic function is normal. The right ventricular  size is mildly enlarged. Mildly increased right ventricular wall  thickness. There is severely elevated pulmonary artery systolic pressure.  The estimated right ventricular systolic  pressure is 82.9 mmHg.   3. Left atrial size was mildly dilated.   4. Right atrial size was mildly dilated.   5. The mitral valve is normal in structure. Mild mitral valve  regurgitation. No evidence of mitral stenosis.   6. Tricuspid valve regurgitation is moderate.   7. The aortic valve is calcified. Aortic valve regurgitation is mild.  Mild aortic valve stenosis. Aortic valve area, by VTI measures 1.45 cm.  Aortic valve mean gradient measures 7.0 mmHg.   8. Moderately dilated pulmonary artery.   9. The inferior vena cava is dilated in size with <50% respiratory  variability, suggesting right atrial pressure of 15 mmHg.  _____________   Patient Profile     81 y.o. 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  Assessment & Plan    1.  Acute on chronic HFpEF/PAH:  Declined admission in 12/2021 despite marked volume overload.  Further wt gain and edema despite outpt subcu lasix, prompting presentation 02/09/22.  Markedly volume overloaded on admission and up 23 kg since 02/2021.  Has responded well  to IV lasix thus far (80mg  BID), and is minus 1.6L yesterday and minus 3.7L since admission.  Echo w/ EF 55-60 and RVSP of 82.63mmHg.  Remains markedly volume overloaded this AM.  Renal fxn stable.  Cont IV diuresis - suspect he will require prolonged hospitalization.  Will change metolazone to daily.    2.  Supply: Demand Ischemia/CAD:  s/p prior LAD stenting and PTCA of the diagonal w/ residual 60% RCA dzs per prior cards notes.  No recent isch eval/cath.  No c/p.  HsTrop minimally elevted @ 22.  Echo w/ nl EF.  Suspec demand ischemia.  No plan for ischemic eval @ this time.  3.  Essential HTN:  BP elevated.  Follow w/ diuresis.  Cont hydralazine.  4. CKD IV:  stable this AM.  5.  HL:  Cont statin.  LDL 68 in Feb.  6.  Permanent Afib:  Well rate-controlled in absence of AVN blocking agents.  Cont reduced dose eliquis.  7.  Normocytic anemia:  H/H stable.  Signed, Murray Hodgkins, NP  02/10/2022, 10:26 AM    For questions or updates, please contact   Please consult [email or web address] for contact info under Cardiology/STEMI.

## 2022-02-10 NOTE — Assessment & Plan Note (Signed)
Repeat echocardiogram with similar EF, significantly elevated right ventricular pressure and severe pulmonary hypertension.  Renal functions currently stable - Continue diuresis with IV Lasix. - Monitor daily weights and accurate I's and O's. -Monitor BMP while he is being diuresed. -Cardiology was consulted-appreciate their help

## 2022-02-10 NOTE — Consult Note (Signed)
   Heart Failure  Nurse Navigator Note  HFpEF 55 to 60%.  Left ventricular internal cavity size was mildly dilated.  Mild left ventricular hypertrophy.  Indeterminate diastolic parameters.  Mild biatrial enlargement.  Mild mitral regurgitation.  Moderate tricuspid regurgitation.  Mild aortic stenosis.  He presented with approximately 20 pound weight gain, abdominal distention, worsening dyspnea, orthopnea, PND and dyspnea on exertion.  BNP was 236(pt is obese, BMI 39.35) chest x-ray suspicious for mild interstitial edema.  Had failed outpatient treatment with use of furosix sq, Lasix and metolazone.  Comorbidities:  Coronary artery disease Type 2 diabetes GERD Hypertension Hyperlipidemia Chronic kidney disease stage II Persistent atrial fibrillation  Medications:  Apixaban 2.5 mg 2 times a day Cardura 1 mg 2 times a day Furosemide 80 mg 2 times a day Hydralazine 100 mg 3 times a day Metolazone 5 mg daily Crestor 5 mg Monday Wednesday Friday  Patient is not on calcium channel blocker nor beta-blocker due to bradycardia.  Labs:  Sodium 143, potassium 4.1, chloride 108, CO2 27, BUN 41, creatinine 2.51, hemoglobin 10.6, hematocrit 33.9, Weight is 117.4 kg Blood pressure 147/61 Intake 695 mL Output 2300 mL   Initial meeting with patient on this admission.  His significant other [name] was not at the bedside.  Discussed heart failure and what it means.  He states that he is familiar with the term.  Discussed how he takes care of himself at home.  He does not weigh himself daily.  Explained the importance of daily weights and recording.  Patient at first was very resistant and just that I am not going to weigh myself daily and record it.  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  Went over fluid restriction of 64 ounces.  Patient felt that he did drink 216 ounce bottles of water  daily, to 12 ounce mugs of coffee and once in a while would have an 8 ounce Candida dry ginger ale.  Discussed with sodium restriction of 2000 mg daily.  Patient states he has not used salt at the table for a long time.  He did not feel that they ate processed  foods.  This time he did not have any further questions.  I told him I would come back and speak with him and [name] is present.  He is in agreement.  He was given the living with heart failure teaching booklet, zone magnet, info on heart failure and low-sodium along with weight chart.  Pricilla Riffle RN [facility]

## 2022-02-10 NOTE — TOC Initial Note (Signed)
Transition of Care [facility]) - Initial/Assessment Note    Patient Details  Name: Kyle Roberson MRN: [id number] Date of Birth: Mar 26, 1940  [facility]) CM/SW Contact:    Laurena Slimmer, RN Phone Number: 02/10/2022, 11:38 AM  Clinical Narrative:                 [facility] consulted for heart failure screen. Heart failure RN Delmar Landau has been informed.          Patient Goals and CMS Choice        Expected Discharge Plan and Services                                                Prior Living Arrangements/Services                       Activities of Daily Living Home Assistive Devices/Equipment: Cane (specify quad or straight) ADL Screening (condition at time of admission) Patient's cognitive ability adequate to safely complete daily activities?: Yes Is the patient deaf or have difficulty hearing?: No Does the patient have difficulty seeing, even when wearing glasses/contacts?: No Does the patient have difficulty concentrating, remembering, or making decisions?: No Patient able to express need for assistance with ADLs?: Yes Does the patient have difficulty dressing or bathing?: No Independently performs ADLs?: No Communication: Independent Dressing (OT): Independent Grooming: Independent Feeding: Independent Bathing: Needs assistance Is this a change from baseline?: Pre-admission baseline Toileting: Needs assistance Is this a change from baseline?: Pre-admission baseline In/Out Bed: Needs assistance Is this a change from baseline?: Pre-admission baseline Walks in Home: Needs assistance Is this a change from baseline?: Pre-admission baseline Does the patient have difficulty walking or climbing stairs?: Yes Weakness of Legs: Both Weakness of Arms/Hands: None  Permission Sought/Granted                  Emotional Assessment              Admission diagnosis:  Acute on chronic diastolic (congestive) heart failure (HCC)  [W25.85] Systolic congestive heart failure, unspecified HF chronicity (HCC) [I50.20] Patient Active Problem List   Diagnosis Date Noted   Acute on chronic diastolic (congestive) heart failure ([place]) 02/09/2022   Paroxysmal atrial fibrillation ([place]) 02/09/2022   Dyslipidemia 02/09/2022   Depression 02/09/2022   Elevated troponin 02/09/2022   Thyroid nodule 06/04/2021   Diabetic peripheral neuropathy ([place])    History of subdural hemorrhage 02/04/2021   Hyperlipidemia associated with type 2 diabetes mellitus ([place])    Acute on chronic diastolic CHF (congestive heart failure) ([place]) 07/19/2019   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 04/28/2019   CKD (chronic kidney disease) stage 4, GFR 15-29 ml/min ([place]) 04/27/2019   Chronic heart failure with preserved ejection fraction (HFpEF) ([place]) 04/27/2019   Morbid obesity ([place]) 04/27/2019   Lymphedema 03/23/2019   Erectile dysfunction 08/01/2018   History of colonic diverticulitis 08/01/2018   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   Venous insufficiency 08/01/2018   Persistent atrial fibrillation ([place]) 08/01/2018   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   Essential hypertension 07/06/2018   Type 2 diabetes mellitus with stage 4 chronic kidney disease, with long-term current use of insulin ([place]) 07/06/2018   Atherosclerotic heart disease of native coronary 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 ([place]) 06/05/2018   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 [place] [address] Phone: [phone] Fax:  [phone]     Social Determinants of Health (SDOH) Interventions    Readmission Risk Interventions     No data to display

## 2022-02-10 NOTE — Assessment & Plan Note (Signed)
Most likely secondary to demand ischemia with acute on chronic diastolic heart failure and severe pulmonary hypertension. -Continue to monitor

## 2022-02-10 NOTE — Plan of Care (Signed)

## 2022-02-10 NOTE — Progress Notes (Signed)
Progress Note   Patient: Kyle Roberson ZDG:[id number] DOB: 10/10/1940 DOA: 02/09/2022     1 DOS: the patient was seen and examined on 02/10/2022   Brief hospital course: Taken from H&P.  Kyle Roberson is a 81 y.o. male with medical history significant for coronary artery disease, type 2 diabetes mellitus, GERD, hypertension and dyslipidemia, who presented to the emergency room with  abdominal distention and weight gain of more than 20 pounds over the last couple of weeks.  The patient has been getting subcutaneous and p.o. Lasix as well as Zaroxolyn.  Patient is also experiencing worsening dyspnea, orthopnea and PND.  ED course.  Blood pressure 153/73, otherwise normal vitals.  Labs pertinent for BUN of 41 and creatinine 2.69 which is at her baseline.  BNP 263, troponin 22>>21. EKG showed junctional rhythm with a rate of 57, right axis deviation, incomplete right bundle branch block and low voltage. Chest x-ray with mild diffuse interstitial infiltrate suspicious for mild station edema.  Patient received 80 mg of IV Lasix, [facility] cardiology was also consulted.  11/20: Vitals with blood pressure 154/65, urinary output of 1550 recorded, BMP with BUN of 41 and creatinine of 2.51.  Pending repeat echocardiogram. Discontinuing IV fluid and starting him on SSI.  11/21: Vitals stable, echocardiogram with 55 to 60% of EF, no regional wall motion abnormalities, indeterminate diastolic function.  Elevated right ventricular pressure.  Severe pulmonary hypertension and mild AR.  UOP recorded of about 2300 with net negative of -3 L. Renal function with small improvement.  Weight recorded today at 117 kg.    Assessment and Plan: * Acute on chronic diastolic (congestive) heart failure (HCC) Repeat echocardiogram with similar EF, significantly elevated right ventricular pressure and severe pulmonary hypertension.  Renal functions currently stable - Continue diuresis with IV Lasix. - Monitor daily  weights and accurate I's and O's. -Monitor BMP while he is being diuresed. -Cardiology was consulted-appreciate their help  Elevated troponin Most likely secondary to demand ischemia with acute on chronic diastolic heart failure and severe pulmonary hypertension. -Continue to monitor  Type 2 diabetes mellitus with stage 4 chronic kidney disease, with long-term current use of insulin ([place]) Patient was on glipizide at home. -SSI  Paroxysmal atrial fibrillation (HCC) Rate well controlled without any rate controlling medications - We will continue Eliquis.  CKD (chronic kidney disease) stage 4, GFR 15-29 ml/min (HCC) Creatinine currently stable around baseline. -Monitor renal function while he is being diuresed  Depression We will continue Zoloft  Morbid obesity ([place]) Estimated body mass index is 41.36 kg/m as calculated from the following:   Height as of this encounter: 5\' 8"  (1.727 m).   Weight as of this encounter: 123.4 kg.   -This will complicate overall prognosis  Essential hypertension Blood pressure currently elevated.  Patient is on multiple medications at home. -Continue home medications which include hydralazine, Imdur, Cardura and diuretics  Dyslipidemia We will continue statin therapy.   Subjective: Patient was seen and examined today.  Having good urinary output, no significant shortness of breath.  Physical Exam: Vitals:   02/09/22 2334 02/10/22 0358 02/10/22 0759 02/10/22 1158  BP: 136/77 (!) 147/74 (!) 147/61 (!) 145/66  Pulse: (!) 55 (!) 57 64 62  Resp: 20 20 (!) 24 18  Temp: 97.9 F (36.6 C) 97.7 F (36.5 C) 97.9 F (36.6 C) 97.7 F (36.5 C)  TempSrc: Oral Oral Oral Oral  SpO2: 95% 94% 92% 92%  Weight:  117.4 kg    Height:  General.  Ill-appearing elderly man, in no acute distress. Pulmonary.  Lungs clear bilaterally, normal respiratory effort. CV.  Regular rate and rhythm, no JVD, rub or murmur. Abdomen.  Soft, nontender, nondistended,  BS positive. CNS.  Alert and oriented .  No focal neurologic deficit. Extremities.  2+ LE edema, signs of chronic venous congestion. Psychiatry.  Judgment and insight appears normal.   Data Reviewed: Prior data reviewed  Family Communication: Talked with significant other on phone.  Disposition: Status is: Inpatient Remains inpatient appropriate because: Severity of illness.  Planned Discharge Destination: Home  DVT prophylaxis. Eliquis Time spent: 50 minutes  This record has been created using Systems analyst. Errors have been sought and corrected,but may not always be located. Such creation errors do not reflect on the standard of care.   Author: Lorella Nimrod, MD 02/10/2022 1:01 PM  For on call review [email or web address].

## 2022-02-11 ENCOUNTER — Encounter: Payer: Self-pay | Admitting: Family Medicine

## 2022-02-11 DIAGNOSIS — I4821 Permanent atrial fibrillation: Secondary | ICD-10-CM

## 2022-02-11 DIAGNOSIS — I2510 Atherosclerotic heart disease of native coronary artery without angina pectoris: Secondary | ICD-10-CM

## 2022-02-11 DIAGNOSIS — I5033 Acute on chronic diastolic (congestive) heart failure: Secondary | ICD-10-CM | POA: Diagnosis not present

## 2022-02-11 LAB — BASIC METABOLIC PANEL
Anion gap: 8 (ref 5–15)
BUN: 44 mg/dL — ABNORMAL HIGH (ref 8–23)
CO2: 32 mmol/L (ref 22–32)
Calcium: 9.2 mg/dL (ref 8.9–10.3)
Chloride: 100 mmol/L (ref 98–111)
Creatinine, Ser: 2.47 mg/dL — ABNORMAL HIGH (ref 0.61–1.24)
GFR, Estimated: 25 mL/min — ABNORMAL LOW (ref >60)
Glucose, Bld: 62 mg/dL — ABNORMAL LOW (ref 70–99)
Potassium: 3.8 mmol/L (ref 3.5–5.1)
Sodium: 140 mmol/L (ref 135–145)

## 2022-02-11 LAB — GLUCOSE, CAPILLARY
Glucose-Capillary: 156 mg/dL — ABNORMAL HIGH (ref 70–99)
Glucose-Capillary: 59 mg/dL — ABNORMAL LOW (ref 70–99)
Glucose-Capillary: 66 mg/dL — ABNORMAL LOW (ref 70–99)
Glucose-Capillary: 88 mg/dL (ref 70–99)
Glucose-Capillary: 93 mg/dL (ref 70–99)

## 2022-02-11 MED ORDER — IPRATROPIUM-ALBUTEROL 0.5-2.5 (3) MG/3ML IN SOLN
3.0000 mL | Freq: Three times a day (TID) | RESPIRATORY_TRACT | Status: DC
  Administered 2022-02-12: 3 mL via RESPIRATORY_TRACT
  Filled 2022-02-11 (×2): qty 3

## 2022-02-11 MED ORDER — SERTRALINE HCL 50 MG PO TABS
25.0000 mg | ORAL_TABLET | Freq: Every day | ORAL | Status: DC
  Administered 2022-02-12 – 2022-02-16 (×5): 25 mg via ORAL
  Filled 2022-02-11 (×5): qty 1

## 2022-02-11 MED ORDER — INSULIN ASPART 100 UNIT/ML IJ SOLN
0.0000 [IU] | Freq: Every day | INTRAMUSCULAR | Status: DC
  Administered 2022-02-17: 5 [IU] via SUBCUTANEOUS
  Filled 2022-02-11 (×2): qty 1

## 2022-02-11 MED ORDER — INSULIN ASPART 100 UNIT/ML IJ SOLN
0.0000 [IU] | Freq: Three times a day (TID) | INTRAMUSCULAR | Status: DC
  Administered 2022-02-12: 3 [IU] via SUBCUTANEOUS
  Administered 2022-02-13: 2 [IU] via SUBCUTANEOUS
  Administered 2022-02-13: 3 [IU] via SUBCUTANEOUS
  Administered 2022-02-14: 5 [IU] via SUBCUTANEOUS
  Administered 2022-02-14 – 2022-02-18 (×2): 3 [IU] via SUBCUTANEOUS
  Filled 2022-02-11 (×9): qty 1

## 2022-02-11 NOTE — Progress Notes (Signed)
.  vest  ReDs clip--43% Measure-28  Station-28

## 2022-02-11 NOTE — Inpatient Diabetes Management (Addendum)
Inpatient Diabetes Program Recommendations  AACE/ADA: New Consensus Statement on Inpatient Glycemic Control   Target Ranges:  Prepandial:   less than 140 mg/dL      Peak postprandial:   less than 180 mg/dL (1-2 hours)      Critically ill patients:  140 - 180 mg/dL    Latest Reference Range & Units 02/10/22 08:03 02/10/22 11:59 02/10/22 17:42 02/10/22 21:11 02/11/22 00:30 02/11/22 08:18  Glucose-Capillary 70 - 99 mg/dL 93 194 (H) 67 (L) 139 (H) 93 66 (L)   Review of Glycemic Control  Diabetes history: DM2 Outpatient Diabetes medications: Glipizide XL 10 mg QAM, NPH 10-15 units after breakfast and before bedtime (not taking) Current orders for Inpatient glycemic control: Glipizide XL 10 mg QAM, Novolog 0-15 units TID with meals, Novolog 0-5 units QHS  Inpatient Diabetes Program Recommendations:    DM medication:  Please consider decreasing Novolog to 0-9 units TID with meals and decrease Glipizide XL to 5 mg QAM (to start 11/23 since already given 10 mg today).  Outpatient DM medications: Patient experienced hypoglycemia while inpatient. Given patient's age and noted hypoglycemia, may need to consider adjusting outpatient DM medications at time of discharge. May want to consider decreasing dose or discontinuing Glipizide and use oral DM medication with low risk of hypoglycemia.  Addendum 02/11/22@11 :50-Spoke with patient at bedside regarding DM. Patient states that he takes Glipizide XL 10 mg daily for DM control. Patient reports that he has not used any insulin in over 6 months. Patient reports that he checks his glucose once a day and it is usually in mid to upper 100's. Inquired about hypoglycemia and patient reports that he does get hypoglycemia from time to time but not very often. Patient reports that he did not have any symptoms of hypoglycemia this morning when glucose was 66 mg/dl. Discussed concerns about hypoglycemia and question if patient has hypoglycemia unawareness. Inquired about  any prior use or knowledge of CGM and patient states that he tried to use one in the past and he did not like it. Discussed that he was given Glipizide this morning but it has now been discontinued as an inpatient since his glucose was 66 mg/dl this morning. Asked patient to pay attention to discharge paperwork to see if DM medication was adjusted at discharge. Patient states he plans to follow up with his PCP and they can make adjustments with DM medications if needed. Encouraged patient to check glucose 2-3 times per day and be sure to follow up with PCP soon after discharge. Patient verbalized understanding of information and has no questions at this time.  Thanks, Barnie Alderman, RN, MSN, [place] Diabetes Coordinator Inpatient Diabetes Program [phone] (Team Pager from 8am to [place])

## 2022-02-11 NOTE — Progress Notes (Signed)
PROGRESS NOTE    Kyle Roberson  YHC:[id number] DOB: 1940-09-04 DOA: 02/09/2022 PCP: Glean Hess, MD  [id number]  LOS: 2 days   Brief hospital course:   Assessment & Plan: [name] is a 81 y.o. male with medical history significant for coronary artery disease, type 2 diabetes mellitus, GERD, hypertension and dyslipidemia, who presented to the emergency room with  abdominal distention and weight gain of more than 20 pounds over the last couple of weeks.  The patient has been getting subcutaneous and p.o. Lasix as well as Zaroxolyn.  Patient is also experiencing worsening dyspnea, orthopnea and PND.    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  Renal functions currently stable --cardiology consulted Plan: --cont IV lasix 80 BID --metolazone 5 mg daily    Elevated troponin --only 22 and 21.  Mild demand ischemia.   Type 2 diabetes mellitus with stage 4 chronic kidney disease With hyperglycemia and hypoglycemia takes Glipizide XL 10 mg daily for DM control. Patient reports that he has not used any insulin in over 6 months.  --d/c glipizide due to hypoglycemia --ACHS and SSI sensitive scale   Paroxysmal atrial fibrillation (HCC) Rate well controlled without any rate controlling medications --cont Eliquis   CKD (chronic kidney disease) stage 4, GFR 15-29 ml/min (HCC) Creatinine currently stable around baseline. -Monitor renal function while he is being diuresed   Depression --resume home Zoloft   Morbid obesity ([place]) Estimated body mass index is 41.36 kg/m as calculated from the following:   Height as of this encounter: 5\' 8"  (1.727 m).   Weight as of this encounter: 123.4 kg.    -This will complicate overall prognosis   Essential hypertension Blood pressure currently elevated.   -Continue  home hydralazine, Imdur, Cardura and diuretics   Dyslipidemia --continue statin therapy.  CAD with remote PCI --cont statin   DVT prophylaxis: DV:VOHYWVP Code Status: Full code  Family Communication:  Level of care: Telemetry Cardiac Dispo:   The patient is from: home Anticipated d/c is to: SNF rehab Anticipated d/c date is: 1-2 days   Subjective and Interval History:  Pt reported good urine output.  Pt complained of left foot pain, reportedly from kicking the bed.  Worked with PT today, O2 sats down to 86% on room air.     Objective: Vitals:   02/11/22 1332 02/11/22 1639 02/11/22 1921 02/11/22 2002  BP:  (!) 140/56 (!) 151/62   Pulse:  (!) 56 64   Resp:  18 18   Temp:  97.9 F (36.6 C) 98.6 F (37 C)   TempSrc:      SpO2: 100% 94% 95% 99%  Weight:      Height:        Intake/Output Summary (Last 24 hours) at 02/11/2022 2020 Last data filed at 02/11/2022 1856 Gross per 24 hour  Intake 1140 ml  Output 2375 ml  Net -1235 ml   Filed Weights   02/08/22 1910 02/10/22 0358 02/11/22 0443  Weight: 123.4 kg 117.4 kg 112.4 kg    Examination:   Constitutional: NAD, AAOx3 HEENT: conjunctivae and lids normal, EOMI CV: No cyanosis.   RESP: normal respiratory effort, on 2L Extremities: skin with thick nodules, no pain with dorsi-flexing left foot. SKIN: warm, dry Neuro: II - XII grossly intact.   Psych: Normal mood and affect.     Data Reviewed: I have personally reviewed  labs and imaging studies  Time spent: 50 minutes  Enzo Bi, MD [facility] If 7PM-7AM, please contact night-coverage 02/11/2022, 8:20 PM

## 2022-02-11 NOTE — Progress Notes (Signed)
Rounding Note    Patient Name: Kyle Roberson Date of Encounter: 02/11/2022  [place] Cardiologist: Ida Rogue, MD   Subjective   Diuresing adequately, net -3.7 L over the past 24 hours.  Still short of breath, volume overloaded.  Inpatient Medications    Scheduled Meds:  apixaban  2.5 mg Oral BID   ascorbic acid  500 mg Oral Daily   cholecalciferol  1,000 Units Oral Daily   dorzolamide-timolol  1 drop Left Eye BID   doxazosin  1 mg Oral BID   furosemide  80 mg Intravenous BID   hydrALAZINE  100 mg Oral TID   insulin aspart  0-5 Units Subcutaneous QHS   insulin aspart  0-9 Units Subcutaneous TID WC   ipratropium-albuterol  3 mL Nebulization Q6H   metolazone  5 mg Oral Daily   multivitamin with minerals  1 tablet Oral Daily   omega-3 acid ethyl esters  1 g Oral Daily   rosuvastatin  5 mg Oral Once per day on Mon Wed Fri   Continuous Infusions:  PRN Meds: acetaminophen **OR** acetaminophen, ipratropium-albuterol, magnesium hydroxide, ondansetron **OR** ondansetron (ZOFRAN) IV, potassium chloride SA, traZODone   Vital Signs    Vitals:   02/11/22 0347 02/11/22 0443 02/11/22 0805 02/11/22 0815  BP: (!) 155/78   (!) 150/61  Pulse: 65   (!) 119  Resp: 19   20  Temp: 98.3 F (36.8 C)   98.2 F (36.8 C)  TempSrc: Oral   Oral  SpO2: 95%  96% 100%  Weight:  112.4 kg    Height:        Intake/Output Summary (Last 24 hours) at 02/11/2022 1105 Last data filed at 02/11/2022 1025 Gross per 24 hour  Intake 540 ml  Output 3025 ml  Net -2485 ml      02/11/2022    4:43 AM 02/10/2022    3:58 AM 02/08/2022    7:10 PM  Last 3 Weights  Weight (lbs) 247 lb 12.8 oz 258 lb 13.1 oz 272 lb  Weight (kg) 112.4 kg 117.4 kg 123.378 kg      Telemetry  Atrial fibrillation  ECG     - Personally Reviewed  Physical Exam   GEN: Mild respiratory distress Neck: No JVD Cardiac: Irregular irregular Respiratory: Diminished breath sounds at bases GI: Soft,  nontender, distended  MS: 3+ edema Neuro:  Nonfocal  Psych: Normal affect   Labs    High Sensitivity Troponin:   Recent Labs  Lab 02/08/22 1919 02/08/22 2210  TROPONINIHS 22* 21*     Chemistry Recent Labs  Lab 02/08/22 1919 02/09/22 0525 02/10/22 0517 02/11/22 0428  NA 142 143 144 140  K 4.7 4.1 4.3 3.8  CL 105 108 105 100  CO2 29 27 32 32  GLUCOSE 109* 168* 92 62*  BUN 41* 41* 37* 44*  CREATININE 2.69* 2.51* 2.42* 2.47*  CALCIUM 9.0 8.7* 9.1 9.2  PROT 7.1  --   --   --   ALBUMIN 3.8  --   --   --   AST 18  --   --   --   ALT 13  --   --   --   ALKPHOS 75  --   --   --   BILITOT 1.0  --   --   --   GFRNONAA 23* 25* 26* 25*  ANIONGAP 8 8 7 8     Lipids No results for input(s): "CHOL", "TRIG", "HDL", "LABVLDL", "LDLCALC", "CHOLHDL"  in the last 168 hours.  Hematology Recent Labs  Lab 02/08/22 1919 02/09/22 0525 02/10/22 0517  WBC 5.6 6.0 6.4  RBC 3.99* 3.61* 3.93*  HGB 11.4* 10.6* 11.1*  HCT 37.3* 33.9* 36.6*  MCV 93.5 93.9 93.1  MCH 28.6 29.4 28.2  MCHC 30.6 31.3 30.3  RDW 16.1* 16.1* 16.0*  PLT 176 155 176   Thyroid No results for input(s): "TSH", "FREET4" in the last 168 hours.  BNP Recent Labs  Lab 02/08/22 1919  BNP 263.5*    DDimer No results for input(s): "DDIMER" in the last 168 hours.   Radiology    ECHOCARDIOGRAM COMPLETE  Result Date: 02/09/2022    ECHOCARDIOGRAM REPORT   Patient Name:   Kyle Roberson Date of Exam: 02/09/2022 Medical Rec #:  [id number]         Height:       68.0 in Accession #:    [id number]        Weight:       272.0 lb Date of Birth:  Jan 26, 1941        BSA:          2.329 m Patient Age:    81 years          BP:           159/75 mmHg Patient Gender: M                 HR:           71 bpm. Exam Location:  [facility] Procedure: 2D Echo, Color Doppler and Cardiac Doppler Indications:     I50.31 congestive heart failure-Acute Diastolic  History:         Patient has prior history of Echocardiogram examinations, most                   recent 05/08/2019. HFpEF; CAD.  Sonographer:     Charmayne Sheer Referring Phys:  [id number] [place] Diagnosing Phys: Kathlyn Sacramento MD  Sonographer Comments: Suboptimal subcostal window. IMPRESSIONS  1. Left ventricular ejection fraction, by estimation, is 55 to 60%. The left ventricle has normal function. The left ventricle has no regional wall motion abnormalities. The left ventricular internal cavity size was mildly dilated. There is mild left ventricular hypertrophy. Left ventricular diastolic parameters are indeterminate. There is the interventricular septum is flattened in systole, consistent with right ventricular pressure overload.  2. Right ventricular systolic function is normal. The right ventricular size is mildly enlarged. Mildly increased right ventricular wall thickness. There is severely elevated pulmonary artery systolic pressure. The estimated right ventricular systolic pressure is 54.6 mmHg.  3. Left atrial size was mildly dilated.  4. Right atrial size was mildly dilated.  5. The mitral valve is normal in structure. Mild mitral valve regurgitation. No evidence of mitral stenosis.  6. Tricuspid valve regurgitation is moderate.  7. The aortic valve is calcified. Aortic valve regurgitation is mild. Mild aortic valve stenosis. Aortic valve area, by VTI measures 1.45 cm. Aortic valve mean gradient measures 7.0 mmHg.  8. Moderately dilated pulmonary artery.  9. The inferior vena cava is dilated in size with <50% respiratory variability, suggesting right atrial pressure of 15 mmHg. FINDINGS  Left Ventricle: Left ventricular ejection fraction, by estimation, is 55 to 60%. The left ventricle has normal function. The left ventricle has no regional wall motion abnormalities. The left ventricular internal cavity size was mildly dilated. There is  mild left ventricular hypertrophy. The interventricular septum is flattened in systole,  consistent with right ventricular pressure overload. Left ventricular  diastolic parameters are indeterminate. Right Ventricle: The right ventricular size is mildly enlarged. Mildly increased right ventricular wall thickness. Right ventricular systolic function is normal. There is severely elevated pulmonary artery systolic pressure. The tricuspid regurgitant velocity is 4.11 m/s, and with an assumed right atrial pressure of 15 mmHg, the estimated right ventricular systolic pressure is 16.1 mmHg. Left Atrium: Left atrial size was mildly dilated. Right Atrium: Right atrial size was mildly dilated. Pericardium: There is no evidence of pericardial effusion. Mitral Valve: The mitral valve is normal in structure. Mild mitral valve regurgitation. No evidence of mitral valve stenosis. Tricuspid Valve: The tricuspid valve is normal in structure. Tricuspid valve regurgitation is moderate . No evidence of tricuspid stenosis. Aortic Valve: The aortic valve is calcified. Aortic valve regurgitation is mild. Aortic regurgitation PHT measures 485 msec. Mild aortic stenosis is present. Aortic valve mean gradient measures 7.0 mmHg. Aortic valve peak gradient measures 12.7 mmHg. Aortic valve area, by VTI measures 1.45 cm. Pulmonic Valve: The pulmonic valve was normal in structure. Pulmonic valve regurgitation is not visualized. No evidence of pulmonic stenosis. Aorta: The aortic root is normal in size and structure. Pulmonary Artery: The pulmonary artery is moderately dilated. Venous: The inferior vena cava is dilated in size with less than 50% respiratory variability, suggesting right atrial pressure of 15 mmHg. IAS/Shunts: No atrial level shunt detected by color flow Doppler.  LEFT VENTRICLE PLAX 2D LVIDd:         5.40 cm   Diastology LVIDs:         3.70 cm   LV e' medial:    8.27 cm/s LV PW:         1.30 cm   LV E/e' medial:  15.7 LV IVS:        0.90 cm   LV e' lateral:   12.00 cm/s LVOT diam:     1.80 cm   LV E/e' lateral: 10.8 LV SV:         52 LV SV Index:   22 LVOT Area:     2.54 cm  RIGHT  VENTRICLE RV Basal diam:  4.30 cm RV S prime:     12.10 cm/s LEFT ATRIUM             Index        RIGHT ATRIUM           Index LA diam:        4.30 cm 1.85 cm/m   RA Area:     22.40 cm LA Vol (A2C):   81.9 ml 35.16 ml/m  RA Volume:   64.10 ml  27.52 ml/m LA Vol (A4C):   56.9 ml 24.43 ml/m LA Biplane Vol: 74.1 ml 31.82 ml/m  AORTIC VALVE                     PULMONIC VALVE AV Area (Vmax):    1.46 cm      PV Vmax:       1.13 m/s AV Area (Vmean):   1.46 cm      PV Vmean:      72.900 cm/s AV Area (VTI):     1.45 cm      PV VTI:        0.208 m AV Vmax:           178.00 cm/s   PV Peak grad:  5.1 mmHg AV Vmean:  127.000 cm/s  PV Mean grad:  2.0 mmHg AV VTI:            0.358 m AV Peak Grad:      12.7 mmHg AV Mean Grad:      7.0 mmHg LVOT Vmax:         102.00 cm/s LVOT Vmean:        73.000 cm/s LVOT VTI:          0.204 m LVOT/AV VTI ratio: 0.57 AI PHT:            485 msec  AORTA Ao Root diam: 3.30 cm MITRAL VALVE                TRICUSPID VALVE MV Area (PHT): 3.60 cm     TR Peak grad:   67.6 mmHg MV Decel Time: 211 msec     TR Vmax:        411.00 cm/s MV E velocity: 130.00 cm/s                             SHUNTS                             Systemic VTI:  0.20 m                             Systemic Diam: 1.80 cm Kathlyn Sacramento MD Electronically signed by Kathlyn Sacramento MD Signature Date/Time: 02/09/2022/6:10:25 PM    Final     Cardiac Studies   TTE 02/09/2022  1. Left ventricular ejection fraction, by estimation, is 55 to 60%. The  left ventricle has normal function. The left ventricle has no regional  wall motion abnormalities. The left ventricular internal cavity size was  mildly dilated. There is mild left  ventricular hypertrophy. Left ventricular diastolic parameters are  indeterminate. There is the interventricular septum is flattened in  systole, consistent with right ventricular pressure overload.   2. Right ventricular systolic function is normal. The right ventricular  size is mildly  enlarged. Mildly increased right ventricular wall  thickness. There is severely elevated pulmonary artery systolic pressure.  The estimated right ventricular systolic  pressure is 28.3 mmHg.   3. Left atrial size was mildly dilated.   4. Right atrial size was mildly dilated.   5. The mitral valve is normal in structure. Mild mitral valve  regurgitation. No evidence of mitral stenosis.   6. Tricuspid valve regurgitation is moderate.   7. The aortic valve is calcified. Aortic valve regurgitation is mild.  Mild aortic valve stenosis. Aortic valve area, by VTI measures 1.45 cm.  Aortic valve mean gradient measures 7.0 mmHg.   8. Moderately dilated pulmonary artery.   9. The inferior vena cava is dilated in size with <50% respiratory  variability, suggesting right atrial pressure of 15 mmHg.   Patient Profile     81 y.o. male with history of CAD/PCI, CKD 4, HFpEF, pulmonary hypertension, permanent atrial fibrillation presenting with worsening shortness of breath, being seen for HFpEF and volume overload.  Assessment & Plan   HFpEF -Still volume overloaded -Net -3.7 L over the past 24 hours, creatinine stable -Continue IV Lasix 80 mg twice daily, metolazone 5 mg daily -Monitor ins and outs, creatinine  2.  Atrial fibrillation -Heart rate controlled off AV nodal agents -Continue Eliquis 2.5 mg twice daily.  3.  CAD/remote PCI -No chest pain -EF normal -Eliquis, Crestor  4.  CKD 4 -Creatinine stable -Monitor with diuresis  Total encounter time more than 50 minutes  Greater than 50% was spent in counseling and coordination of care with the patient     Signed, Kate Sable, MD  02/11/2022, 11:05 AM

## 2022-02-11 NOTE — Evaluation (Signed)
Occupational Therapy Evaluation Patient Details Name: Kyle Roberson MRN: [id number] DOB: [birth year] Today's Date: 02/11/2022   History of Present Illness 81 y.o. male with medical history significant for CAD, T2DM, GERD, HTN, CKD stage 4, HLD, Afib, pulmonary arterial HTN, and traumatic subdural hematoma s/p craniotomy (11/22) who presented to [facility] ED with abdominal distention and weight gain of >20lbs over the last couple of weeks. Admitted for acute on chronic diastolic (congestive) heart failure.   Clinical Impression   Pt was seen for OT evaluation this date. Prior to hospital admission, pt reports being mod indep with mobility (PRN furniture walking, no AD), and indep with ADL. Girlfriend [name] assists with cooking, cleaning, and med mgt per pt report. Pt lives in a 1 story home + basement with [name]. Per pt, [name] manages cattle farms and is able to assist PRN. Pt endorses limited self mgt of chronic disease. OT attempted to facilitate problem solving for improving self mgt of chronic disease. When asked about barriers to daily weight tracking, pt stated "I don't think a crap about that" and when asked to elaborate, reports his scales are digital and he does not trust them and that he is not interested in recording his weights. Reports [name] (gf) is too busy to assist. Pt was aware of BLE edema over time and reports he shared this with his MD. Pt states he does not think elevating BLE is helpful and had BLE down while seated in recliner upon OT's arrival. With max encouragement, agreeable to OT elevating BLE.  Pt presents to acute OT demonstrating impaired ADL performance and functional mobility 2/2 decreased strength, L ankle pain (reports kicking the bed last night), balance, and questionable insight/awareness of deficits and health implications of poor self mgt (See OT problem list). Pt currently requires MAX A for LB ADL tasks and suspect significant assist for ADL mobility. Pt reports pt required  no to +1 assist for mobility. However, RN later reports wavering reports of assist levels. Pt would benefit from skilled OT services to address noted impairments and functional limitations (see below for any additional details) in order to maximize safety and independence while minimizing falls risk and caregiver burden. Upon hospital discharge, recommend additional OT services to maximize indep with ADL, IADL, and improved self mgt of chronic disease to minimize risk of additional functional decline and readmissions. Will continue to assess for optimal discharge recommendation ([place] vs SNF) based on pt's performance in future OT sessions.     Recommendations for follow up therapy are one component of a multi-disciplinary discharge planning process, led by the attending physician.  Recommendations may be updated based on patient status, additional functional criteria and insurance authorization.   Follow Up Recommendations  Follow physician's recommendations for discharge plan and follow up therapies     Assistance Recommended at Discharge Frequent or constant Supervision/Assistance  Patient can return home with the following A lot of help with walking and/or transfers;A lot of help with bathing/dressing/bathroom;Assistance with cooking/housework;Assist for transportation;Direct supervision/assist for medications management;Help with stairs or ramp for entrance    Functional Status Assessment  Patient has had a recent decline in their functional status and demonstrates the ability to make significant improvements in function in a reasonable and predictable amount of time.  Equipment Recommendations  None recommended by OT    Recommendations for Other Services       Precautions / Restrictions Precautions Precautions: Fall Restrictions Weight Bearing Restrictions: No Other Position/Activity Restrictions: L ankle pain, imaging pending  Mobility Bed Mobility               General  bed mobility comments: NT, in recliner    Transfers                   General transfer comment: Pt adamantly declining to attempt standing 2/2 L ankle pain      Balance Overall balance assessment: Needs assistance Sitting-balance support: Single extremity supported, Bilateral upper extremity supported, Feet supported Sitting balance-Leahy Scale: Fair                                     ADL either performed or assessed with clinical judgement   ADL                                         General ADL Comments: Pt reports unable to even attempt LB dressing 2/2 L ankle pain, requiring MAX A at this time for LB ADL     Vision         Perception     Praxis      Pertinent Vitals/Pain Pain Assessment Pain Assessment: 0-10 Pain Score: 10-Worst pain ever Pain Location: "25/10" pain in L ankle after he reports kicking the bed trying to get out Pain Descriptors / Indicators: Sharp Pain Intervention(s): Limited activity within patient's tolerance, Monitored during session, Repositioned, Patient requesting pain meds-RN notified     Hand Dominance     Extremity/Trunk Assessment Upper Extremity Assessment Upper Extremity Assessment: Generalized weakness   Lower Extremity Assessment Lower Extremity Assessment: Generalized weakness;LLE deficits/detail (BLE edema noted, BLE scaly, discolored; significant difficulty with SLR bilaterally) LLE Deficits / Details: L ankle pain LLE: Unable to fully assess due to pain       Communication Communication Communication: HOH   Cognition Arousal/Alertness: Awake/alert Behavior During Therapy: WFL for tasks assessed/performed Overall Cognitive Status: No family/caregiver present to determine baseline cognitive functioning                                 General Comments: pt alert and oriented, answers most questions with jokes requiring VC to redirect, questionable insight into  deficits and overall health     General Comments       Exercises Other Exercises Other Exercises: OT attempted to facilitate problem solving for improving self mgt of chronic disease. When asked about barriers to daily weight tracking, pt stated "I don't think a crap about that" and when asked to elaborate, reports his scales are digital and he does not trust them. Reports [name] (gf) is too busy to assist. He reports he will not record information. Pt was aware of BLE edema over time and reports he shared this with his MD. Pt states he does not think elevating BLE is helpful and had BLE down while seated in recliner upon OT's arrival. With max encouragement, agreeable to OT elevating BLE.   Shoulder Instructions      Home Living Family/patient expects to be discharged to:: Private residence Living Arrangements: Spouse/significant other (girlfriend [name]) Available Help at Discharge: Family;Available PRN/intermittently Type of Home: House Home Access: Ramped entrance     Home Layout: One level;Laundry or work area in basement     [employer] Shower/Tub: [facility];Tub/shower unit  Bathroom Toilet: Handicapped height Bathroom Accessibility: Yes How Accessible: Accessible via wheelchair Home Equipment: [place] - quad;Shower seat;Grab bars - toilet;Grab bars - tub/shower;Hand held shower head;Wheelchair - manual          Prior Functioning/Environment Prior Level of Function : Needs assist             Mobility Comments: Pt denies AD use but does endores PRN furniture walking; denies falls in past 29mo ADLs Comments: Pt reports indep with ADL, assist from [facility] for IADL including med mgt, cooking, cleaning        OT Problem List: Decreased strength;Pain;Decreased cognition;Decreased safety awareness;Impaired balance (sitting and/or standing);Decreased knowledge of use of DME or AE;Obesity      OT Treatment/Interventions: Self-care/ADL training;Therapeutic exercise;Therapeutic  activities;DME and/or AE instruction;Patient/family education;Balance training    OT Goals(Current goals can be found in the care plan section) Acute Rehab OT Goals Patient Stated Goal: have less ankle pain OT Goal Formulation: With patient Time For Goal Achievement: 02/25/22 Potential to Achieve Goals: Good ADL Goals Pt Will Perform Lower Body Dressing: with modified independence;with adaptive equipment;sit to/from stand Pt Will Transfer to Toilet: with supervision;ambulating (LRAD) Pt Will Perform Toileting - Clothing Manipulation and hygiene: with modified independence Additional ADL Goal #1: Pt will identify at least 1 learned strategy to help improve self mgt of chronic disease to minimize risk of readmission.  OT Frequency: Min 2X/week    Co-evaluation              AM-PAC OT "6 Clicks" Daily Activity     Outcome Measure Help from another person eating meals?: None Help from another person taking care of personal grooming?: A Little Help from another person toileting, which includes using toliet, bedpan, or urinal?: A Lot   Help from another person to put on and taking off regular upper body clothing?: A Little Help from another person to put on and taking off regular lower body clothing?: A Lot 6 Click Score: 14   End of Session Nurse Communication: Mobility status;Patient requests pain meds  Activity Tolerance: Patient limited by pain Patient left: in chair;with call bell/phone within reach  OT Visit Diagnosis: Other abnormalities of gait and mobility (R26.89);Muscle weakness (generalized) (M62.81);Pain Pain - Right/Left: Left Pain - part of body: Ankle and joints of foot                Time: 4287-6811 OT Time Calculation (min): 20 min Charges:  OT General Charges $OT Visit: 1 Visit OT Evaluation $OT Eval Moderate Complexity: 1 Mod OT Treatments $Therapeutic Activity: 8-22 mins  Ardeth Perfect., MPH, MS, OTR/L ascom [phone] 02/11/22, 11:24 AM

## 2022-02-11 NOTE — Progress Notes (Signed)
PT Cancellation Note  Patient Details Name: Kyle Roberson MRN: [id number] DOB: 1940-04-09   Cancelled Treatment:     Pt complaining of significant foot pain. RN suggested waiting until pain meds are administered. PT will continue as able   [employer], SPT  02/11/2022, 11:35 AM

## 2022-02-11 NOTE — Evaluation (Addendum)
Physical Therapy Evaluation Patient Details Name: Kyle Roberson MRN: [id number] DOB: 08/07/1940 Today's Date: 02/11/2022  History of Present Illness  81 y.o. male with medical history significant for CAD, T2DM, GERD, HTN, CKD stage 4, HLD, Afib, pulmonary arterial HTN, and traumatic subdural hematoma s/p craniotomy (11/22) who presented to [facility] ED with abdominal distention and weight gain of >20lbs over the last couple of weeks. Admitted for acute on chronic diastolic (congestive) heart failure.  Clinical Impression  Pt found in chair upon PT entry with c/o right foot pain. Sit<>Stand min assist x1 with RW. Pt ambulated 3 ft with RW, CGA, and chair follow pt reported fatigue and pain as inability to ambulate further. Pt O2 stats dropped to 86 and pt was questioned about O2 which he reported he "doesn't need." 2 L O2 via [place] was given. O2 stats return to [facility] and MD notified. Pt would benefit from skilled physical therapy to address the listed deficits (see below) to increase independence with ADLs and safety. Current recommendation is SNF to return pt to PLOF and increase safety.          Recommendations for follow up therapy are one component of a multi-disciplinary discharge planning process, led by the attending physician.  Recommendations may be updated based on patient status, additional functional criteria and insurance authorization.  Follow Up Recommendations Skilled nursing-short term rehab (<3 hours/day) Can patient physically be transported by private vehicle: Yes    Assistance Recommended at Discharge Frequent or constant Supervision/Assistance  Patient can return home with the following  A little help with walking and/or transfers;A little help with bathing/dressing/bathroom;Assistance with cooking/housework;Direct supervision/assist for medications management;Assist for transportation;Help with stairs or ramp for entrance    Equipment Recommendations None recommended by PT   Recommendations for Other Services       Functional Status Assessment Patient has had a recent decline in their functional status and demonstrates the ability to make significant improvements in function in a reasonable and predictable amount of time.     Precautions / Restrictions Precautions Precautions: Fall Restrictions Weight Bearing Restrictions: No Other Position/Activity Restrictions: L ankle pain, imaging pending      Mobility  Bed Mobility                    Transfers Overall transfer level: Needs assistance Equipment used: Rolling walker (2 wheels) Transfers: Sit to/from Stand Sit to Stand: Min assist                Ambulation/Gait Ambulation/Gait assistance: Min guard Gait Distance (Feet): 3 Feet Assistive device: Rolling walker (2 wheels) Gait Pattern/deviations: Antalgic, Step-to pattern, Decreased weight shift to left       General Gait Details: required mod VC to keep feet inside the walker  Stairs            Wheelchair Mobility    Modified Rankin (Stroke Patients Only)       Balance Overall balance assessment: Needs assistance Sitting-balance support: Bilateral upper extremity supported, Feet supported Sitting balance-Leahy Scale: Fair     Standing balance support: Bilateral upper extremity supported, Reliant on assistive device for balance Standing balance-Leahy Scale: Fair                               Pertinent Vitals/Pain Pain Assessment Pain Assessment: Faces Faces Pain Scale: Hurts even more Pain Descriptors / Indicators: Grimacing Pain Intervention(s): Limited activity within patient's tolerance, Monitored during session,  Premedicated before session    [place] expects to be discharged to:: Private residence Living Arrangements: Spouse/significant other Available Help at Discharge: Family;Available PRN/intermittently Type of Home: House Home Access: Ramped entrance        Home Layout: One level;Laundry or work area in [place]: [place];Shower seat;Grab bars - toilet;Grab bars - tub/shower;Hand held shower head;Wheelchair - manual      Prior Function Prior Level of Function : Needs assist             Mobility Comments: Pt denies AD use but does endores PRN furniture walking; denies falls in past 14mo ADLs Comments: Pt reports indep with ADL, assist from [place] for IADL including med mgt, cooking, cleaning     Hand Dominance        Extremity/Trunk Assessment   Upper Extremity Assessment Upper Extremity Assessment: Generalized weakness    Lower Extremity Assessment Lower Extremity Assessment: Generalized weakness LLE Deficits / Details: L ankle pain LLE: Unable to fully assess due to pain    Cervical / Trunk Assessment Cervical / Trunk Assessment: Normal  Communication   Communication: [facility]  Cognition                                                General Comments      Exercises     Assessment/Plan    PT Assessment Patient needs continued PT services  PT Problem List Decreased strength;Decreased balance;Decreased cognition;Decreased mobility;Decreased knowledge of use of DME;Decreased activity tolerance;Decreased safety awareness       PT Treatment Interventions DME instruction;Functional mobility training;Balance training;Patient/family education;Gait training;Therapeutic activities;Neuromuscular re-education;Stair training;Therapeutic exercise    PT Goals (Current goals can be found in the Care Plan section)  Acute Rehab PT Goals Patient Stated Goal: to control foot pain PT Goal Formulation: With patient Time For Goal Achievement: 02/25/22 Potential to Achieve Goals: Good    Frequency Min 2X/week     Co-evaluation               AM-PAC PT "6 Clicks" Mobility  Outcome Measure Help needed turning from your back to your side while in a flat bed without using bedrails?: A  Little Help needed moving from lying on your back to sitting on the side of a flat bed without using bedrails?: A Little Help needed moving to and from a bed to a chair (including a wheelchair)?: A Little Help needed standing up from a chair using your arms (e.g., wheelchair or bedside chair)?: A Little Help needed to walk in hospital room?: A Lot Help needed climbing 3-5 steps with a railing? : A Lot 6 Click Score: 16    End of Session Equipment Utilized During Treatment: Gait belt Activity Tolerance: Patient limited by pain Patient left: in chair;with call bell/phone within reach (with MD) Nurse Communication: Mobility status PT Visit Diagnosis: Unsteadiness on feet (R26.81);Other abnormalities of gait and mobility (R26.89);Muscle weakness (generalized) (M62.81);Difficulty in walking, not elsewhere classified (R26.2)    Time: 0932-6712 PT Time Calculation (min) (ACUTE ONLY): 14 min   Charges:              Claiborne Billings O'Daniel, SPT  02/11/2022, 2:09 PM

## 2022-02-12 DIAGNOSIS — I5033 Acute on chronic diastolic (congestive) heart failure: Secondary | ICD-10-CM | POA: Diagnosis not present

## 2022-02-12 LAB — BASIC METABOLIC PANEL
Anion gap: 14 (ref 5–15)
BUN: 63 mg/dL — ABNORMAL HIGH (ref 8–23)
CO2: 28 mmol/L (ref 22–32)
Calcium: 9 mg/dL (ref 8.9–10.3)
Chloride: 95 mmol/L — ABNORMAL LOW (ref 98–111)
Creatinine, Ser: 2.97 mg/dL — ABNORMAL HIGH (ref 0.61–1.24)
GFR, Estimated: 20 mL/min — ABNORMAL LOW (ref >60)
Glucose, Bld: 177 mg/dL — ABNORMAL HIGH (ref 70–99)
Potassium: 4 mmol/L (ref 3.5–5.1)
Sodium: 137 mmol/L (ref 135–145)

## 2022-02-12 LAB — GLUCOSE, CAPILLARY
Glucose-Capillary: 121 mg/dL — ABNORMAL HIGH (ref 70–99)
Glucose-Capillary: 95 mg/dL (ref 70–99)
Glucose-Capillary: 84 mg/dL (ref 70–99)
Glucose-Capillary: 150 mg/dL — ABNORMAL HIGH (ref 70–99)
Glucose-Capillary: 221 mg/dL — ABNORMAL HIGH (ref 70–99)
Glucose-Capillary: 197 mg/dL — ABNORMAL HIGH (ref 70–99)

## 2022-02-12 LAB — MAGNESIUM: Magnesium: 2.3 mg/dL (ref 1.7–2.4)

## 2022-02-12 LAB — CBC
HCT: 35 % — ABNORMAL LOW (ref 39.0–52.0)
Hemoglobin: 10.9 g/dL — ABNORMAL LOW (ref 13.0–17.0)
MCH: 28.7 pg (ref 26.0–34.0)
MCHC: 31.1 g/dL (ref 30.0–36.0)
MCV: 92.1 fL (ref 80.0–100.0)
Platelets: 156 10*3/uL (ref 150–400)
RBC: 3.8 MIL/uL — ABNORMAL LOW (ref 4.22–5.81)
RDW: 15.8 % — ABNORMAL HIGH (ref 11.5–15.5)
WBC: 9.1 10*3/uL (ref 4.0–10.5)
nRBC: 0 % (ref 0.0–0.2)

## 2022-02-12 MED ORDER — FUROSEMIDE 10 MG/ML IJ SOLN
12.0000 mg/h | INTRAVENOUS | Status: DC
  Administered 2022-02-12 – 2022-02-14 (×3): 8 mg/h via INTRAVENOUS
  Administered 2022-02-17 – 2022-02-19 (×3): 12 mg/h via INTRAVENOUS
  Filled 2022-02-12 (×8): qty 20

## 2022-02-12 MED ORDER — ISOSORBIDE MONONITRATE ER 30 MG PO TB24
30.0000 mg | ORAL_TABLET | Freq: Every day | ORAL | Status: DC
  Administered 2022-02-12 – 2022-02-21 (×10): 30 mg via ORAL
  Filled 2022-02-12 (×10): qty 1

## 2022-02-12 NOTE — Progress Notes (Signed)
PROGRESS NOTE    Kyle Roberson  LOV:[id number] DOB: 1940/06/27 DOA: 02/09/2022 PCP: Glean Hess, MD  245A/245A-AA  LOS: 3 days   Brief hospital course:   Assessment & Plan: Kyle Roberson is a 81 y.o. male with medical history significant for coronary artery disease, type 2 diabetes mellitus, GERD, hypertension and dyslipidemia, who presented to the emergency room with  abdominal distention and weight gain of more than 20 pounds over the last couple of weeks.  The patient has been getting subcutaneous and p.o. Lasix as well as Zaroxolyn.  Patient is also experiencing worsening dyspnea, orthopnea and PND.    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  Renal functions currently stable --cardiology consulted, was started on IV lasix 80 BID Plan: --switch to lasix gtt @8 , per cardio --metolazone 5 mg daily   Acute hypoxemic respiratory failure --O2 sats down to 86% on room air while working with PT on 11/22.  Due to pulm edema. --Pt said his outpatient doctor ordered him home oxygen, but he didn't want it. --Continue supplemental O2 to keep sats >=90%, wean as tolerated   Elevated troponin --only 22 and 21.  Mild demand ischemia.   Type 2 diabetes mellitus with stage 4 chronic kidney disease With hyperglycemia and hypoglycemia takes Glipizide XL 10 mg daily for DM control. Patient reports that he has not used any insulin in over 6 months.  --d/c'ed glipizide due to hypoglycemia --ACHS and SSI sensitive scale   Paroxysmal atrial fibrillation (HCC) Rate well controlled without any rate controlling medications --cont Eliquis   CKD (chronic kidney disease) stage 4, GFR 15-29 ml/min (HCC) Creatinine currently stable around baseline. -Monitor renal function while he is being diuresed   Depression --cont home  Zoloft   Morbid obesity ([place]) Estimated body mass index is 41.36 kg/m as calculated from the following:   Height as of this encounter: 5\' 8"  (1.727 m).   Weight as of this encounter: 123.4 kg.    Essential hypertension Blood pressure currently elevated.   -Continue home hydralazine, Imdur, Cardura and diuretics   Dyslipidemia --continue statin therapy.  CAD with remote PCI --cont statin   DVT prophylaxis: OA:CZYSAYT Code Status: Full code  Family Communication:  Level of care: Telemetry Cardiac Dispo:   The patient is from: home Anticipated d/c is to: home (pt declined SNF rehab) Anticipated d/c date is: 2-3 days   Subjective and Interval History:  Pt reported left foot pain better.     Objective: Vitals:   02/12/22 0453 02/12/22 0723 02/12/22 1148 02/12/22 1652  BP:  (!) 156/62 (!) 140/63 139/75  Pulse:  (!) 56 (!) 58 61  Resp:  18 17 18   Temp:  97.7 F (36.5 C) 97.7 F (36.5 C) 97.6 F (36.4 C)  TempSrc:  Oral  Oral  SpO2:  98% 97% 93%  Weight: 113.3 kg     Height:        Intake/Output Summary (Last 24 hours) at 02/12/2022 1847 Last data filed at 02/12/2022 1652 Gross per 24 hour  Intake 632.46 ml  Output 1450 ml  Net -817.54 ml   Filed Weights   02/10/22 0358 02/11/22 0443 02/12/22 0453  Weight: 117.4 kg 112.4 kg 113.3 kg    Examination:   Constitutional: NAD, AAOx3, sitting in recliner HEENT: left lower lid flipped out (chronic) EOMI CV: No cyanosis.   RESP: normal respiratory effort  Extremities: severe tense edema in BLE SKIN: warm, dry Neuro: II - XII grossly intact.   Psych: Normal mood and affect.  Appropriate judgement and reason   Data Reviewed: I have personally reviewed labs and imaging studies  Time spent: 35 minutes  Enzo Bi, MD [facility] If 7PM-7AM, please contact night-coverage 02/12/2022, 6:47 PM

## 2022-02-12 NOTE — TOC Progression Note (Signed)
Transition of Care [facility]) - Progression Note    Patient Details  Name: Kyle Roberson MRN: [id number] Date of Birth: 05-Sep-1940  Transition of [facility]) CM/SW Contact  Shelbie Hutching, RN Phone Number: 02/12/2022, 1:18 PM  Clinical Narrative:    RNCM spoke with patient via phone about therapy recommendations for SNF.  Patient says he will not be going to SNF and he also does not want HH, he says he does not need either.  He lives with his girlfriend, Jeannene Patella, and she takes good care of him.  He says he has a walker, wheelchair, and crutches at home.  [name] will be able to come and pick him up at discharge.       Expected Discharge Plan: Home/Self Care Barriers to Discharge: Continued Medical Work up  Expected Discharge Plan and Services Expected Discharge Plan: Home/Self Care   Discharge Planning Services: CM Consult   Living arrangements for the past 2 months: Single Family Home                 DME Arranged: N/A DME Agency: NA       HH Arranged: Patient Refused HH, Refused SNF           Social Determinants of Health (SDOH) Interventions    Readmission Risk Interventions     No data to display

## 2022-02-12 NOTE — Progress Notes (Addendum)
Cardiology Progress Note   Patient Name: Kyle Roberson Date of Encounter: 02/12/2022  Primary Cardiologist: Ida Rogue, MD  Subjective   Thinks breathing has improved some.  No chest pain.   Inpatient Medications    Scheduled Meds:  apixaban  2.5 mg Oral BID   ascorbic acid  500 mg Oral Daily   cholecalciferol  1,000 Units Oral Daily   dorzolamide-timolol  1 drop Left Eye BID   doxazosin  1 mg Oral BID   furosemide  80 mg Intravenous BID   hydrALAZINE  100 mg Oral TID   insulin aspart  0-5 Units Subcutaneous QHS   insulin aspart  0-9 Units Subcutaneous TID WC   ipratropium-albuterol  3 mL Nebulization TID   metolazone  5 mg Oral Daily   multivitamin with minerals  1 tablet Oral Daily   omega-3 acid ethyl esters  1 g Oral Daily   rosuvastatin  5 mg Oral Once per day on Mon Wed Fri   sertraline  25 mg Oral Daily   Continuous Infusions:  PRN Meds: acetaminophen **OR** acetaminophen, ipratropium-albuterol, magnesium hydroxide, ondansetron **OR** ondansetron (ZOFRAN) IV, potassium chloride SA, traZODone   Vital Signs    Vitals:   02/12/22 0017 02/12/22 0340 02/12/22 0453 02/12/22 0723  BP: (!) 152/66 (!) 153/46  (!) 156/62  Pulse: (!) 47 (!) 56  (!) 56  Resp: 18 18  18   Temp: 98.4 F (36.9 C) 97.7 F (36.5 C)  97.7 F (36.5 C)  TempSrc: Oral   Oral  SpO2: 95% 99%  98%  Weight:   113.3 kg   Height:        Intake/Output Summary (Last 24 hours) at 02/12/2022 0928 Last data filed at 02/12/2022 0723 Gross per 24 hour  Intake 1020 ml  Output 2275 ml  Net -1255 ml   Filed Weights   02/10/22 0358 02/11/22 0443 02/12/22 0453  Weight: 117.4 kg 112.4 kg 113.3 kg    Physical Exam   GEN: Obese, in no acute distress.  HEENT: Grossly normal.  Neck: Supple, JVD to jaw, no carotid bruits or masses. Cardiac: IR, IR, distant, no murmurs, rubs, or gallops. No clubbing, cyanosis, 3+ bilat LE edema w/ hip/flank edema.  Radials 2+, DP/PT 1+ and equal bilaterally.   Respiratory:  Respirations regular and unlabored, crackles throughout. GI: OBese, firm, edematous, diffusely tender, BS + x 4. MS: no deformity or atrophy. Skin: warm and dry.  Chronic venous stasis changes to lower ext. Neuro:  Strength and sensation are intact. Psych: AAOx3.  Normal affect.  Labs    Chemistry Recent Labs  Lab 02/08/22 1919 02/09/22 0525 02/10/22 0517 02/11/22 0428  NA 142 143 144 140  K 4.7 4.1 4.3 3.8  CL 105 108 105 100  CO2 29 27 32 32  GLUCOSE 109* 168* 92 62*  BUN 41* 41* 37* 44*  CREATININE 2.69* 2.51* 2.42* 2.47*  CALCIUM 9.0 8.7* 9.1 9.2  PROT 7.1  --   --   --   ALBUMIN 3.8  --   --   --   AST 18  --   --   --   ALT 13  --   --   --   ALKPHOS 75  --   --   --   BILITOT 1.0  --   --   --   GFRNONAA 23* 25* 26* 25*  ANIONGAP 8 8 7 8      Hematology Recent Labs  Lab 02/08/22 1919 02/09/22  0525 02/10/22 0517  WBC 5.6 6.0 6.4  RBC 3.99* 3.61* 3.93*  HGB 11.4* 10.6* 11.1*  HCT 37.3* 33.9* 36.6*  MCV 93.5 93.9 93.1  MCH 28.6 29.4 28.2  MCHC 30.6 31.3 30.3  RDW 16.1* 16.1* 16.0*  PLT 176 155 176    Cardiac Enzymes  Recent Labs  Lab 02/08/22 1919 02/08/22 2210  TROPONINIHS 22* 21*      BNP    Component Value Date/Time   BNP 263.5 (H) 02/08/2022 1919   Lipids  Lab Results  Component Value Date   CHOL 121 05/05/2021   HDL 34 (L) 05/05/2021   LDLCALC 68 05/05/2021   TRIG 95 05/05/2021   CHOLHDL 3.6 05/05/2021    HbA1c  Lab Results  Component Value Date   HGBA1C 7.7 (H) 12/02/2021    Radiology    US Abdomen Limited  Result Date: 02/09/2022 CLINICAL DATA:  Evaluate for ascites EXAM: LIMITED ABDOMEN ULTRASOUND FOR ASCITES TECHNIQUE: Limited ultrasound survey for ascites was performed in all four abdominal quadrants. COMPARISON:  None Available. FINDINGS: No ascites seen within the abdomen.  Right pleural effusion noted. IMPRESSION: No visible ascites. Right pleural effusion. Electronically Signed   By: Rolm Baptise  M.D.   On: 02/09/2022 01:27   DG Chest 2 View  Result Date: 02/08/2022 CLINICAL DATA:  Shortness of breath.  Peripheral edema. EXAM: CHEST - 2 VIEW COMPARISON:  04/29/2019 FINDINGS: Stable mild cardiomegaly. Diffuse interstitial infiltrates are suspicious for mild interstitial edema. No evidence of pulmonary consolidation or pleural effusion. IMPRESSION: Mild diffuse interstitial infiltrates, suspicious for mild interstitial edema. Electronically Signed   By: Marlaine Hind M.D.   On: 02/08/2022 19:37    Telemetry    Afib, mostly 50's to 60's w/ some drops into the high 30's/40's overnight - Personally Reviewed  Cardiac Studies   2D Echocardiogram 11.20.203    1. Left ventricular ejection fraction, by estimation, is 55 to 60%. The  left ventricle has normal function. The left ventricle has no regional  wall motion abnormalities. The left ventricular internal cavity size was  mildly dilated. There is mild left  ventricular hypertrophy. Left ventricular diastolic parameters are  indeterminate. There is the interventricular septum is flattened in  systole, consistent with right ventricular pressure overload.   2. Right ventricular systolic function is normal. The right ventricular  size is mildly enlarged. Mildly increased right ventricular wall  thickness. There is severely elevated pulmonary artery systolic pressure.  The estimated right ventricular systolic  pressure is 89.3 mmHg.   3. Left atrial size was mildly dilated.   4. Right atrial size was mildly dilated.   5. The mitral valve is normal in structure. Mild mitral valve  regurgitation. No evidence of mitral stenosis.   6. Tricuspid valve regurgitation is moderate.   7. The aortic valve is calcified. Aortic valve regurgitation is mild.  Mild aortic valve stenosis. Aortic valve area, by VTI measures 1.45 cm.  Aortic valve mean gradient measures 7.0 mmHg.   8. Moderately dilated pulmonary artery.   9. The inferior vena cava is  dilated in size with <50% respiratory  variability, suggesting right atrial pressure of 15 mmHg.  _____________   Patient Profile     81 y.o. 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   Assessment & Plan    1.  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  Further wt gain and edema despite outpt subcu lasix, prompting presentation 02/09/22.  Markedly volume overloaded on admission and up 23 kg since 02/2021.  Cont to respond well to IV diuresis.  Minus 1.1L yesterday and minus 7.6l since admission.  Wt recorded as up 1.1 kg this AM (standing scale).  Still markedly volume overloaded on exam w/ some symptom improvement.  BUN/Creat up slightly w/ stable bicarb.  Overall, renal fxn still w/in prior range.  Cont IV diuresis @ 80 BID w/ metolazone daily.  With poor hygiene at baseline and CKD IV, poor candidate for sglt2i.  2.  Supply:Demand Ischemia/CAD:  s/p prior LAD stenting and PTCA of the diagonal w/ residual 60% RCA dzs per prior cards notes.  No recent isch eval/cath.  No c/p.  HsTrop minimally elevated @ 22.  Echo w/ nl EF.  Suspec demand ischemia.  No plan for ischemic eval @ this time.   3.  Essential HTN:  BP remains elevated in the 150's.  Currently on hydralazine 100mg  TID.  Poor candidate for acei/arb/arni/mra in setting of renal failure.  Poor candidate for ? blocker/clonidine in setting of baseline bradycardia.  Poor candidate for amlodipine due to severe lower ext swelling and chronic venous stasis.  Will add long acting nitrate.  Can consider doxazosin.  4.  CKD IV:  BUN/Creat up slightly this AM, but still in general range of historical  values.  Follow w/ ongoing diuresis.  5.  HL:  Cont statin.  LDL 68 in Feb.  6.  Permanent Afib:  rate-controlled and sometimes slow, in the absence of AVN blocking agents.  Cont reduced dose eliquis.  Signed, Murray Hodgkins, NP  02/12/2022, 9:28 AM    For questions or updates, please contact   Please consult [email or web address] for contact info under Cardiology/STEMI.

## 2022-02-13 DIAGNOSIS — I5033 Acute on chronic diastolic (congestive) heart failure: Secondary | ICD-10-CM | POA: Diagnosis not present

## 2022-02-13 LAB — BASIC METABOLIC PANEL
Anion gap: 13 (ref 5–15)
BUN: 70 mg/dL — ABNORMAL HIGH (ref 8–23)
CO2: 33 mmol/L — ABNORMAL HIGH (ref 22–32)
Calcium: 9.1 mg/dL (ref 8.9–10.3)
Chloride: 95 mmol/L — ABNORMAL LOW (ref 98–111)
Creatinine, Ser: 3.06 mg/dL — ABNORMAL HIGH (ref 0.61–1.24)
GFR, Estimated: 20 mL/min — ABNORMAL LOW (ref >60)
Glucose, Bld: 121 mg/dL — ABNORMAL HIGH (ref 70–99)
Potassium: 4.1 mmol/L (ref 3.5–5.1)
Sodium: 141 mmol/L (ref 135–145)

## 2022-02-13 LAB — GLUCOSE, CAPILLARY
Glucose-Capillary: 151 mg/dL — ABNORMAL HIGH (ref 70–99)
Glucose-Capillary: 111 mg/dL — ABNORMAL HIGH (ref 70–99)
Glucose-Capillary: 193 mg/dL — ABNORMAL HIGH (ref 70–99)
Glucose-Capillary: 245 mg/dL — ABNORMAL HIGH (ref 70–99)
Glucose-Capillary: 233 mg/dL — ABNORMAL HIGH (ref 70–99)

## 2022-02-13 NOTE — Progress Notes (Addendum)
PROGRESS NOTE    Kyle Roberson  YKD:[id number] DOB: 1940/12/30 DOA: 02/09/2022 PCP: Glean Hess, MD  [id number]/[id number]-AA  LOS: 4 days   Brief hospital course:   Assessment & Plan: Kyle Roberson is a 81 y.o. male with medical history significant for coronary artery disease, type 2 diabetes mellitus, GERD, hypertension and dyslipidemia, who presented to the emergency room with  abdominal distention and weight gain of more than 20 pounds over the last couple of weeks.  The patient has been getting subcutaneous and p.o. Lasix as well as Zaroxolyn.  Patient is also experiencing worsening dyspnea, orthopnea and PND.    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  Renal functions currently stable --cardiology consulted, was started on IV lasix 80 BID and switched to lasix gtt on 11/23 Plan: --cont lasix gtt --metolazone 5 mg daily   Acute hypoxemic respiratory failure --O2 sats down to 86% on room air while working with PT on 11/22.  Due to pulm edema. --Pt said his outpatient doctor ordered him home oxygen, but he didn't want it. --Continue supplemental O2 to keep sats >=90%, wean as tolerated   Elevated troponin --only 22 and 21.  Mild demand ischemia.   Type 2 diabetes mellitus with stage 4 chronic kidney disease With hyperglycemia and hypoglycemia takes Glipizide XL 10 mg daily for DM control. Patient reports that he has not used any insulin in over 6 months.  --d/c'ed glipizide due to hypoglycemia --ACHS and SSI sensitive scale   Paroxysmal atrial fibrillation (HCC) Rate well controlled without any rate controlling medications --cont low-dose Eliquis   CKD (chronic kidney disease) stage 4, GFR 15-29 ml/min (HCC) Creatinine currently stable around baseline. -Monitor renal function while he is being diuresed    Depression --cont home Zoloft   Morbid obesity ([place]) Estimated body mass index is 41.36 kg/m as calculated from the following:   Height as of this encounter: 5\' 8"  (1.727 m).   Weight as of this encounter: 123.4 kg.    Essential hypertension Blood pressure currently elevated.   -Continue home hydralazine, Imdur, Cardura and diuretics   Dyslipidemia --cont statin  CAD with remote PCI --cont statin   DVT prophylaxis: [id number] Code Status: Full code  Family Communication:  Level of care: Telemetry Cardiac Dispo:   The patient is from: home Anticipated d/c is to: home (pt declined SNF rehab) Anticipated d/c date is: 2-3 days   Subjective and Interval History:  Pt denied dyspnea.  Said he "pee'ed like a hoarse".     Objective: Vitals:   02/13/22 0500 02/13/22 0800 02/13/22 1109 02/13/22 1512  BP: (!) 149/74 (!) 145/62 (!) 136/52 (!) 147/60  Pulse: 61 62 (!) 54 63  Resp: 18 20 (!) 22 (!) 24  Temp: 97.9 F (36.6 C) 98 F (36.7 C) (!) 97.4 F (36.3 C) 97.7 F (36.5 C)  TempSrc: Oral Oral    SpO2: 95% 96% 95% 98%  Weight:      Height:        Intake/Output Summary (Last 24 hours) at 02/13/2022 1835 Last data filed at 02/13/2022 1730 Gross per 24 hour  Intake 360 ml  Output 2825 ml  Net -2465 ml   Filed Weights   02/10/22 0358 02/11/22 0443 02/12/22 0453  Weight: 117.4 kg 112.4 kg 113.3 kg    Examination:   Constitutional: NAD, AAOx3 HEENT: conjunctivae and lids normal, EOMI CV:  No cyanosis.   RESP: normal respiratory effort, on RA Extremities: slightly less tense edema in BLE Neuro: II - XII grossly intact.     Data Reviewed: I have personally reviewed labs and imaging studies  Time spent: 35 minutes  Enzo Bi, MD [facility] If 7PM-7AM, please contact night-coverage 02/13/2022, 6:35 PM

## 2022-02-13 NOTE — Progress Notes (Signed)
Physical Therapy Treatment Patient Details Name: Kyle Roberson MRN: [id number] DOB: [birth year] Today's Date: 02/13/2022   History of Present Illness 81 y/o male presented to ED on 02/09/22 for abdominal distention and weight >20 lbs over last couple of weeks. Admitted for acute on chronic diastolic CHF. PMH: CAD, T2DM, HTN, CKD stage 4, Afib, pulmonary HTN, traumatic SDH s/p craniotomy (11/22)    PT Comments    Patient agreeable to PT tx session. Patient reporting feeling less pain this date but more difficulty with mobility. Educated patient on importance of increasing amount of OOB mobility to/from recliner or bathroom to improve strength and tolerance, patient verbalized understanding but unsure if fully understanding importance. Using jokes to defer questions and showing poor insight into deficits and health conditions. Requires modA for bed mobility and minA for sit to stand transfer from elevated surface. Took pivotal steps to recliner with RW and min guard but declined further mobility with no reason provided. Performed limited exercises seated in recliner to promote LE strengthening. Continue to recommend SNF for ongoing Physical Therapy.       Recommendations for follow up therapy are one component of a multi-disciplinary discharge planning process, led by the attending physician.  Recommendations may be updated based on patient status, additional functional criteria and insurance authorization.  Follow Up Recommendations  Skilled nursing-short term rehab (<3 hours/day) Can patient physically be transported by private vehicle: Yes   Assistance Recommended at Discharge Frequent or constant Supervision/Assistance  Patient can return home with the following A little help with walking and/or transfers;A little help with bathing/dressing/bathroom;Assistance with cooking/housework;Direct supervision/assist for medications management;Assist for transportation;Help with stairs or ramp for  entrance   Equipment Recommendations  None recommended by PT    Recommendations for Other Services       Precautions / Restrictions Precautions Precautions: Fall Precaution Comments: 2L O2 Restrictions Weight Bearing Restrictions: No     Mobility  Bed Mobility Overal bed mobility: Needs Assistance Bed Mobility: Supine to Sit     Supine to sit: Mod assist     General bed mobility comments: assist for trunk elevation and L LE management. Increased time required to complete    Transfers Overall transfer level: Needs assistance Equipment used: Rolling March Steyer (2 wheels) Transfers: Sit to/from Stand Sit to Stand: Min assist, From elevated surface           General transfer comment: unable to stand from low bed surface. MinA to stand from elevated bed surface with cues for hand placement but poor follow through despite repetition and manual facilitation    Ambulation/Gait Ambulation/Gait assistance: Min guard Gait Distance (Feet): 3 Feet Assistive device: Rolling Marinus Eicher (2 wheels) Gait Pattern/deviations: Step-to pattern, Decreased stride length, Wide base of support Gait velocity: decreased     General Gait Details: patient able to take steps towards recliner with min guard for safety. No overt LOB. Patient declined further mobility with no reason provided.   Stairs             Wheelchair Mobility    Modified Rankin (Stroke Patients Only)       Balance Overall balance assessment: Needs assistance Sitting-balance support: Bilateral upper extremity supported, Feet supported Sitting balance-Leahy Scale: Fair     Standing balance support: Bilateral upper extremity supported, Reliant on assistive device for balance Standing balance-Leahy Scale: Fair  Cognition Arousal/Alertness: Awake/alert Behavior During Therapy: WFL for tasks assessed/performed Overall Cognitive Status: No family/caregiver present to  determine baseline cognitive functioning                                 General Comments: using jokes to defer questions being asked. Poor insight into deficits and health condition.        Exercises General Exercises - Lower Extremity Long Arc Quad: Both, 10 reps, Seated Hip Flexion/Marching: Both, 10 reps, Seated    General Comments        Pertinent Vitals/Pain Pain Assessment Pain Assessment: Faces Faces Pain Scale: Hurts a little bit Pain Location: B feet Pain Descriptors / Indicators: Discomfort Pain Intervention(s): Monitored during session    Home Living                          Prior Function            PT Goals (current goals can now be found in the care plan section) Acute Rehab PT Goals Patient Stated Goal: to get out of here PT Goal Formulation: With patient Time For Goal Achievement: 02/25/22 Potential to Achieve Goals: Good Progress towards PT goals: Progressing toward goals    Frequency    Min 2X/week      PT Plan Current plan remains appropriate    Co-evaluation              AM-PAC PT "6 Clicks" Mobility   Outcome Measure  Help needed turning from your back to your side while in a flat bed without using bedrails?: A Little Help needed moving from lying on your back to sitting on the side of a flat bed without using bedrails?: A Little Help needed moving to and from a bed to a chair (including a wheelchair)?: A Little Help needed standing up from a chair using your arms (e.g., wheelchair or bedside chair)?: A Little Help needed to walk in hospital room?: A Lot Help needed climbing 3-5 steps with a railing? : A Lot 6 Click Score: 16    End of Session Equipment Utilized During Treatment: Gait belt;Oxygen Activity Tolerance: Patient tolerated treatment well Patient left: in chair;with call bell/phone within reach Nurse Communication: Mobility status PT Visit Diagnosis: Unsteadiness on feet (R26.81);Other  abnormalities of gait and mobility (R26.89);Muscle weakness (generalized) (M62.81);Difficulty in walking, not elsewhere classified (R26.2)     Time: 1410-1437 PT Time Calculation (min) (ACUTE ONLY): 27 min  Charges:  $Therapeutic Exercise: 8-22 mins $Therapeutic Activity: 8-22 mins                     Harry Shuck A. Gilford Rile PT, DPT [facility] - Acute Rehabilitation Services    Jacob Cicero A Jahmad Petrich 02/13/2022, 2:50 PM

## 2022-02-13 NOTE — Progress Notes (Signed)
Cardiology Progress Note   Patient Name: Kyle Roberson Date of Encounter: 02/13/2022  Primary Cardiologist: Ida Rogue, MD  Subjective   Dyspnea unchanged over the past 24 hours. Feels like his lower extremity edema is a little better. No chest pain. Documented UOP 1.5 L for the past 24 hours, net - 9.4 L for the admission.    Inpatient Medications    Scheduled Meds:  apixaban  2.5 mg Oral BID   ascorbic acid  500 mg Oral Daily   cholecalciferol  1,000 Units Oral Daily   dorzolamide-timolol  1 drop Left Eye BID   doxazosin  1 mg Oral BID   hydrALAZINE  100 mg Oral TID   insulin aspart  0-5 Units Subcutaneous QHS   insulin aspart  0-9 Units Subcutaneous TID WC   isosorbide mononitrate  30 mg Oral Daily   metolazone  5 mg Oral Daily   multivitamin with minerals  1 tablet Oral Daily   omega-3 acid ethyl esters  1 g Oral Daily   rosuvastatin  5 mg Oral Once per day on Mon Wed Fri   sertraline  25 mg Oral Daily   Continuous Infusions:  furosemide (LASIX) 200 mg in dextrose 5 % 100 mL (2 mg/mL) infusion 8 mg/hr (02/12/22 1532)   PRN Meds: acetaminophen **OR** acetaminophen, ipratropium-albuterol, magnesium hydroxide, ondansetron **OR** ondansetron (ZOFRAN) IV, potassium chloride SA, traZODone   Vital Signs    Vitals:   02/12/22 1652 02/12/22 2132 02/12/22 2132 02/13/22 0500  BP: 139/75 (!) 166/72 (!) 166/72 (!) 149/74  Pulse: 61  71 61  Resp: 18  17 18   Temp: 97.6 F (36.4 C)  98.1 F (36.7 C) 97.9 F (36.6 C)  TempSrc: Oral   Oral  SpO2: 93%  94% 95%  Weight:      Height:        Intake/Output Summary (Last 24 hours) at 02/13/2022 0743 Last data filed at 02/13/2022 0041 Gross per 24 hour  Intake 152.46 ml  Output 1950 ml  Net -1797.54 ml    Filed Weights   02/10/22 0358 02/11/22 0443 02/12/22 0453  Weight: 117.4 kg 112.4 kg 113.3 kg    Physical Exam   GEN: Obese, in no acute distress.  HEENT: Grossly normal.  Neck: Supple, JVD to jaw, no  carotid bruits or masses. Cardiac: IR, IR, distant, I/VI systolic murmur RUSB, no rubs, or gallops. No clubbing, cyanosis, 3+ bilat LE edema w/ hip/flank edema, changes consistent with chronic venous stasis. Radials 2+, DP/PT 1+ and equal bilaterally.  Respiratory:  Respirations regular and unlabored, crackles throughout. GI: OBese, firm, edematous, diffusely tender, BS + x 4. MS: no deformity or atrophy. Skin: warm and dry.  Chronic venous stasis changes to lower ext. Neuro:  Strength and sensation are intact. Psych: AAOx3.  Normal affect.  Labs    Chemistry Recent Labs  Lab 02/08/22 1919 02/09/22 0525 02/10/22 0517 02/11/22 0428 02/12/22 1456  NA 142   < > 144 140 137  K 4.7   < > 4.3 3.8 4.0  CL 105   < > 105 100 95*  CO2 29   < > 32 32 28  GLUCOSE 109*   < > 92 62* 177*  BUN 41*   < > 37* 44* 63*  CREATININE 2.69*   < > 2.42* 2.47* 2.97*  CALCIUM 9.0   < > 9.1 9.2 9.0  PROT 7.1  --   --   --   --   ALBUMIN  3.8  --   --   --   --   AST 18  --   --   --   --   ALT 13  --   --   --   --   ALKPHOS 75  --   --   --   --   BILITOT 1.0  --   --   --   --   GFRNONAA 23*   < > 26* 25* 20*  ANIONGAP 8   < > 7 8 14    < > = values in this interval not displayed.      Hematology Recent Labs  Lab 02/09/22 0525 02/10/22 0517 02/12/22 1456  WBC 6.0 6.4 9.1  RBC 3.61* 3.93* 3.80*  HGB 10.6* 11.1* 10.9*  HCT 33.9* 36.6* 35.0*  MCV 93.9 93.1 92.1  MCH 29.4 28.2 28.7  MCHC 31.3 30.3 31.1  RDW 16.1* 16.0* 15.8*  PLT 155 176 156     Cardiac Enzymes  Recent Labs  Lab 02/08/22 1919 02/08/22 2210  TROPONINIHS 22* 21*       BNP    Component Value Date/Time   BNP 263.5 (H) 02/08/2022 1919   Lipids  Lab Results  Component Value Date   CHOL 121 05/05/2021   HDL 34 (L) 05/05/2021   LDLCALC 68 05/05/2021   TRIG 95 05/05/2021   CHOLHDL 3.6 05/05/2021    HbA1c  Lab Results  Component Value Date   HGBA1C 7.7 (H) 12/02/2021    Radiology    US Abdomen  Limited  Result Date: 02/09/2022 IMPRESSION: No visible ascites. Right pleural effusion. Electronically Signed   By: Rolm Baptise M.D.   On: 02/09/2022 01:27   DG Chest 2 View  Result Date: 02/08/2022 IMPRESSION: Mild diffuse interstitial infiltrates, suspicious for mild interstitial edema. Electronically Signed   By: Marlaine Hind M.D.   On: 02/08/2022 19:37    Telemetry    Afib, mostly 60's to 70's w/ some drops brief into the high 30's/40's bpm overnight - Personally Reviewed  Cardiac Studies   2D Echocardiogram 11.20.203    1. Left ventricular ejection fraction, by estimation, is 55 to 60%. The  left ventricle has normal function. The left ventricle has no regional  wall motion abnormalities. The left ventricular internal cavity size was  mildly dilated. There is mild left  ventricular hypertrophy. Left ventricular diastolic parameters are  indeterminate. There is the interventricular septum is flattened in  systole, consistent with right ventricular pressure overload.   2. Right ventricular systolic function is normal. The right ventricular  size is mildly enlarged. Mildly increased right ventricular wall  thickness. There is severely elevated pulmonary artery systolic pressure.  The estimated right ventricular systolic  pressure is 27.7 mmHg.   3. Left atrial size was mildly dilated.   4. Right atrial size was mildly dilated.   5. The mitral valve is normal in structure. Mild mitral valve  regurgitation. No evidence of mitral stenosis.   6. Tricuspid valve regurgitation is moderate.   7. The aortic valve is calcified. Aortic valve regurgitation is mild.  Mild aortic valve stenosis. Aortic valve area, by VTI measures 1.45 cm.  Aortic valve mean gradient measures 7.0 mmHg.   8. Moderately dilated pulmonary artery.   9. The inferior vena cava is dilated in size with <50% respiratory  variability, suggesting right atrial pressure of 15 mmHg.  _____________   Patient  Profile     81 y.o. 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   Assessment & Plan    1.  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  Further weight gain and edema despite outpt subcutaneous Lasix, prompting presentation 02/09/22.  Markedly volume overloaded on admission and up 23 kg since 02/2021.  Continues to respond well to IV diuresis.  Minus 1.5L yesterday and minus 9.4 since admission.  Weight pending this morning.  Still markedly volume overloaded on exam with some symptom improvement.  Labs pending this morning.  Continue Lasix gtt at 8 mg/hr with metolazone daily with further recommendations pending updated labs.  With poor hygiene at baseline and CKD IV, poor candidate for sglt2i.  2.  Supply:Demand Ischemia/CAD:  Status post prior LAD stenting and PTCA of the diagonal with residual 60% RCA disease per prior cards notes.  No recent ischemic evaluation/cath.  No chest pain.  HsTrop minimally elevated @ 22.  Echo with nl EF.  Suspect demand ischemia.  No plan for ischemic eval at this time.   3.  Essential HTN:  BP remains elevated in the 140's to 150's.  Currently on hydralazine 100mg  TID, Imdur, and Cardura.  Poor candidate for acei/arb/arni/mra in setting of renal failure.  Poor candidate for ? blocker/clonidine in setting of baseline bradycardia.  Poor candidate for amlodipine due to severe lower extremity swelling and chronic venous stasis.  Can consider doxazosin.  4.  CKD IV:  BUN/Creat pending.  Follow with ongoing diuresis.  5.  HL:  Cont statin.  LDL 68 in Feb.  6.  Permanent Afib:  rate-controlled and sometimes slow, in the absence of  AVN blocking agents.  Cont reduced dose Eliquis.  Signed, Christell Faith, PA-C  02/13/2022, 7:43 AM    For questions or updates, please contact   Please consult [email or web address] for contact info under Cardiology/STEMI.

## 2022-02-14 DIAGNOSIS — I5033 Acute on chronic diastolic (congestive) heart failure: Secondary | ICD-10-CM | POA: Diagnosis not present

## 2022-02-14 LAB — BASIC METABOLIC PANEL
Anion gap: 12 (ref 5–15)
BUN: 78 mg/dL — ABNORMAL HIGH (ref 8–23)
CO2: 34 mmol/L — ABNORMAL HIGH (ref 22–32)
Calcium: 9 mg/dL (ref 8.9–10.3)
Chloride: 94 mmol/L — ABNORMAL LOW (ref 98–111)
Creatinine, Ser: 2.92 mg/dL — ABNORMAL HIGH (ref 0.61–1.24)
GFR, Estimated: 21 mL/min — ABNORMAL LOW (ref >60)
Glucose, Bld: 153 mg/dL — ABNORMAL HIGH (ref 70–99)
Potassium: 3.6 mmol/L (ref 3.5–5.1)
Sodium: 140 mmol/L (ref 135–145)

## 2022-02-14 LAB — GLUCOSE, CAPILLARY
Glucose-Capillary: 141 mg/dL — ABNORMAL HIGH (ref 70–99)
Glucose-Capillary: 146 mg/dL — ABNORMAL HIGH (ref 70–99)
Glucose-Capillary: 201 mg/dL — ABNORMAL HIGH (ref 70–99)
Glucose-Capillary: 225 mg/dL — ABNORMAL HIGH (ref 70–99)
Glucose-Capillary: 236 mg/dL — ABNORMAL HIGH (ref 70–99)
Glucose-Capillary: 266 mg/dL — ABNORMAL HIGH (ref 70–99)

## 2022-02-14 LAB — CBC
HCT: 34.3 % — ABNORMAL LOW (ref 39.0–52.0)
Hemoglobin: 10.9 g/dL — ABNORMAL LOW (ref 13.0–17.0)
MCH: 29.2 pg (ref 26.0–34.0)
MCHC: 31.8 g/dL (ref 30.0–36.0)
MCV: 92 fL (ref 80.0–100.0)
Platelets: 178 10*3/uL (ref 150–400)
RBC: 3.73 MIL/uL — ABNORMAL LOW (ref 4.22–5.81)
RDW: 15.3 % (ref 11.5–15.5)
WBC: 6.5 10*3/uL (ref 4.0–10.5)
nRBC: 0 % (ref 0.0–0.2)

## 2022-02-14 LAB — MAGNESIUM: Magnesium: 2.5 mg/dL — ABNORMAL HIGH (ref 1.7–2.4)

## 2022-02-14 NOTE — Progress Notes (Signed)
Cardiology Progress Note   Patient Name: Kyle Roberson Date of Encounter: 02/14/2022  Primary Cardiologist: Ida Rogue, MD  Subjective   Dyspnea a little improved today. Also, feels like his lower extremity edema is a little better. No chest pain. Documented UOP 2.5 L for the past 24 hours, net - 12.0 L for the admission.    Inpatient Medications    Scheduled Meds:  apixaban  2.5 mg Oral BID   ascorbic acid  500 mg Oral Daily   cholecalciferol  1,000 Units Oral Daily   dorzolamide-timolol  1 drop Left Eye BID   doxazosin  1 mg Oral BID   hydrALAZINE  100 mg Oral TID   insulin aspart  0-5 Units Subcutaneous QHS   insulin aspart  0-9 Units Subcutaneous TID WC   isosorbide mononitrate  30 mg Oral Daily   metolazone  5 mg Oral Daily   multivitamin with minerals  1 tablet Oral Daily   omega-3 acid ethyl esters  1 g Oral Daily   rosuvastatin  5 mg Oral Once per day on Mon Wed Fri   sertraline  25 mg Oral Daily   Continuous Infusions:  furosemide (LASIX) 200 mg in dextrose 5 % 100 mL (2 mg/mL) infusion 8 mg/hr (02/13/22 1029)   PRN Meds: acetaminophen **OR** acetaminophen, ipratropium-albuterol, magnesium hydroxide, ondansetron **OR** ondansetron (ZOFRAN) IV, potassium chloride SA, traZODone   Vital Signs    Vitals:   02/13/22 2100 02/14/22 0051 02/14/22 0404 02/14/22 0500  BP: (!) 167/59 (!) 160/73  (!) 154/67  Pulse: 60 62  62  Resp: 18   20  Temp: 97.9 F (36.6 C) (!) 97.5 F (36.4 C)  98.1 F (36.7 C)  TempSrc:  Oral    SpO2: 100% 94%  96%  Weight:   114.9 kg   Height:        Intake/Output Summary (Last 24 hours) at 02/14/2022 0743 Last data filed at 02/14/2022 0403 Gross per 24 hour  Intake 600 ml  Output 3175 ml  Net -2575 ml    Filed Weights   02/11/22 0443 02/12/22 0453 02/14/22 0404  Weight: 112.4 kg 113.3 kg 114.9 kg    Physical Exam   GEN: Obese, in no acute distress.  HEENT: Grossly normal.  Neck: Supple, JVD to jaw, no carotid  bruits or masses. Cardiac: IR, IR, distant, I/VI systolic murmur RUSB, no rubs, or gallops. No clubbing, cyanosis, 3+ bilat LE edema w/ hip/flank edema, changes consistent with chronic venous stasis. Radials 2+, DP/PT 1+ and equal bilaterally.  Respiratory:  Respirations regular and unlabored, crackles throughout. GI: OBese, firm, edematous, diffusely tender, BS + x 4. MS: no deformity or atrophy. Skin: warm and dry.  Chronic venous stasis changes to lower ext. Neuro:  Strength and sensation are intact. Psych: AAOx3.  Normal affect.  Labs    Chemistry Recent Labs  Lab 02/08/22 1919 02/09/22 0525 02/12/22 1456 02/13/22 0825 02/14/22 0603  NA 142   < > 137 141 140  K 4.7   < > 4.0 4.1 3.6  CL 105   < > 95* 95* 94*  CO2 29   < > 28 33* 34*  GLUCOSE 109*   < > 177* 121* 153*  BUN 41*   < > 63* 70* 78*  CREATININE 2.69*   < > 2.97* 3.06* 2.92*  CALCIUM 9.0   < > 9.0 9.1 9.0  PROT 7.1  --   --   --   --  ALBUMIN 3.8  --   --   --   --   AST 18  --   --   --   --   ALT 13  --   --   --   --   ALKPHOS 75  --   --   --   --   BILITOT 1.0  --   --   --   --   GFRNONAA 23*   < > 20* 20* 21*  ANIONGAP 8   < > 14 13 12    < > = values in this interval not displayed.      Hematology Recent Labs  Lab 02/10/22 0517 02/12/22 1456 02/14/22 0603  WBC 6.4 9.1 6.5  RBC 3.93* 3.80* 3.73*  HGB 11.1* 10.9* 10.9*  HCT 36.6* 35.0* 34.3*  MCV 93.1 92.1 92.0  MCH 28.2 28.7 29.2  MCHC 30.3 31.1 31.8  RDW 16.0* 15.8* 15.3  PLT 176 156 178     Cardiac Enzymes  Recent Labs  Lab 02/08/22 1919 02/08/22 2210  TROPONINIHS 22* 21*       BNP    Component Value Date/Time   BNP 263.5 (H) 02/08/2022 1919   Lipids  Lab Results  Component Value Date   CHOL 121 05/05/2021   HDL 34 (L) 05/05/2021   LDLCALC 68 05/05/2021   TRIG 95 05/05/2021   CHOLHDL 3.6 05/05/2021    HbA1c  Lab Results  Component Value Date   HGBA1C 7.7 (H) 12/02/2021    Radiology    US Abdomen  Limited  Result Date: 02/09/2022 IMPRESSION: No visible ascites. Right pleural effusion. Electronically Signed   By: Rolm Baptise M.D.   On: 02/09/2022 01:27   DG Chest 2 View  Result Date: 02/08/2022 IMPRESSION: Mild diffuse interstitial infiltrates, suspicious for mild interstitial edema. Electronically Signed   By: Marlaine Hind M.D.   On: 02/08/2022 19:37    Telemetry    Afib, mostly 50's to 60's w/ some drops brief into the high 30's/40's bpm overnight - Personally Reviewed  Cardiac Studies   2D Echocardiogram 11.20.203    1. Left ventricular ejection fraction, by estimation, is 55 to 60%. The  left ventricle has normal function. The left ventricle has no regional  wall motion abnormalities. The left ventricular internal cavity size was  mildly dilated. There is mild left  ventricular hypertrophy. Left ventricular diastolic parameters are  indeterminate. There is the interventricular septum is flattened in  systole, consistent with right ventricular pressure overload.   2. Right ventricular systolic function is normal. The right ventricular  size is mildly enlarged. Mildly increased right ventricular wall  thickness. There is severely elevated pulmonary artery systolic pressure.  The estimated right ventricular systolic  pressure is 85.2 mmHg.   3. Left atrial size was mildly dilated.   4. Right atrial size was mildly dilated.   5. The mitral valve is normal in structure. Mild mitral valve  regurgitation. No evidence of mitral stenosis.   6. Tricuspid valve regurgitation is moderate.   7. The aortic valve is calcified. Aortic valve regurgitation is mild.  Mild aortic valve stenosis. Aortic valve area, by VTI measures 1.45 cm.  Aortic valve mean gradient measures 7.0 mmHg.   8. Moderately dilated pulmonary artery.   9. The inferior vena cava is dilated in size with <50% respiratory  variability, suggesting right atrial pressure of 15 mmHg.  _____________   Patient  Profile     81 y.o. 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   Assessment & Plan    1.  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  Further weight gain and edema despite outpt subcutaneous Lasix, prompting presentation 02/09/22.  Markedly volume overloaded on admission and up 23 kg since 02/2021.  Continues to respond well to IV diuresis.  Minus 2.5L yesterday and minus 12.0 since admission.  Still markedly volume overloaded on exam with some symptom improvement.  Labs with up trending BUN with improved SCr.  Continue Lasix gtt at 8 mg/hr.  Hold daily metolazone for today.  With poor hygiene at baseline and CKD IV, poor candidate for sglt2i.  2.  Supply demand Ischemia/CAD:  Status post prior LAD stenting and PTCA of the diagonal with residual 60% RCA disease per prior cards notes.  No recent ischemic evaluation/cath.  No chest pain.  HsTrop minimally elevated @ 22.  Echo with nl EF.  Suspect demand ischemia.  No plan for ischemic eval at this time.   3.  Essential HTN:  BP remains elevated in the 140's to 150's.  Currently on hydralazine 100mg  TID, Imdur, and Cardura.  Poor candidate for acei/arb/arni/mra in setting of renal failure.  Poor candidate for ? blocker/clonidine in setting of baseline bradycardia.  Poor candidate for amlodipine due to severe lower extremity swelling and chronic venous stasis.    4.  CKD IV:  BUN up trending to slightly improved SCr.  Follow with ongoing diuresis.  5.  HL:  Cont statin.  LDL 68 in Feb.  6.  Permanent Afib:  rate-controlled and sometimes slow, in the absence of AVN blocking agents.  Cont reduced dose  Eliquis.  Signed, Christell Faith, PA-C  02/14/2022, 7:43 AM    For questions or updates, please contact   Please consult [email or web address] for contact info under Cardiology/STEMI.

## 2022-02-14 NOTE — Plan of Care (Signed)

## 2022-02-14 NOTE — Progress Notes (Signed)
PROGRESS NOTE    Kyle Roberson  IOE:[id number] DOB: 1940/12/17 DOA: 02/09/2022 PCP: Kyle Hess, MD  [id number]  LOS: 5 days   Brief hospital course:   Assessment & Plan: Kyle Roberson is a 81 y.o. male with medical history significant for coronary artery disease, type 2 diabetes mellitus, GERD, hypertension and dyslipidemia, who presented to the emergency room with  abdominal distention and weight gain of more than 20 pounds over the last couple of weeks.  The patient has been getting subcutaneous and p.o. Lasix as well as Zaroxolyn.  Patient is also experiencing worsening dyspnea, orthopnea and PND.    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  Renal functions currently stable --cardiology consulted, was started on IV lasix 80 BID and switched to lasix gtt on 11/23 Plan: --cont lasix gtt per cardio --hold metolazone  Acute hypoxemic respiratory failure --O2 sats down to 86% on room air while working with PT on 11/22.  Due to pulm edema. --Pt said his outpatient doctor ordered him home oxygen, but he didn't want it. --weaned to RA   Elevated troponin --only 22 and 21.  Mild demand ischemia.   Type 2 diabetes mellitus with stage 4 chronic kidney disease With hyperglycemia and hypoglycemia takes Glipizide XL 10 mg daily for DM control. Patient reports that he has not used any insulin in over 6 months.  --d/c'ed glipizide due to hypoglycemia --ACHS and SSI sensitive scale   Paroxysmal atrial fibrillation (HCC) Rate well controlled without any rate controlling medications --cont low-dose Eliquis   CKD (chronic kidney disease) stage 4, GFR 15-29 ml/min (HCC) Creatinine currently stable around baseline. -Monitor renal function while he is being diuresed   Depression --cont home Zoloft   Morbid  obesity ([place]) Estimated body mass index is 41.36 kg/m as calculated from the following:   Height as of this encounter: 5\' 8"  (1.727 m).   Weight as of this encounter: 123.4 kg.    Essential hypertension Blood pressure currently elevated.   -Continue home hydralazine, Imdur, Cardura and diuretics   Dyslipidemia --cont statin  CAD with remote PCI --cont statin   DVT prophylaxis: [id number] Code Status: Full code  Family Communication:  Level of care: Telemetry Cardiac Dispo:   The patient is from: home Anticipated d/c is to: home (pt declined SNF rehab) Anticipated d/c date is: 2-3 days   Subjective and Interval History:  Noted leg swelling improved.   Objective: Vitals:   02/14/22 0500 02/14/22 0810 02/14/22 1221 02/14/22 1229  BP: (!) 154/67 (!) 145/53 138/62   Pulse: 62 64 (!) 59   Resp: 20 16 16    Temp: 98.1 F (36.7 C) 97.8 F (36.6 C) 98.6 F (37 C)   TempSrc:      SpO2: 96% 92% (!) 85% 95%  Weight:      Height:        Intake/Output Summary (Last 24 hours) at 02/14/2022 1540 Last data filed at 02/14/2022 0834 Gross per 24 hour  Intake 240 ml  Output 2625 ml  Net -2385 ml   Filed Weights   02/11/22 0443 02/12/22 0453 02/14/22 0404  Weight: 112.4 kg 113.3 kg 114.9 kg    Examination:   Constitutional: NAD, AAOx3 HEENT: left lower lid flipped out, EOMI CV: No cyanosis.   RESP: normal respiratory effort, on RA Extremities: edema in BLE improved SKIN: warm, dry Neuro: II - XII  grossly intact.     Data Reviewed: I have personally reviewed labs and imaging studies  Time spent: 35 minutes  Kyle Bi, MD [facility] If 7PM-7AM, please contact night-coverage 02/14/2022, 3:40 PM

## 2022-02-15 DIAGNOSIS — I4821 Permanent atrial fibrillation: Secondary | ICD-10-CM | POA: Diagnosis not present

## 2022-02-15 DIAGNOSIS — I5033 Acute on chronic diastolic (congestive) heart failure: Secondary | ICD-10-CM | POA: Diagnosis not present

## 2022-02-15 LAB — BASIC METABOLIC PANEL
Anion gap: 15 (ref 5–15)
BUN: 82 mg/dL — ABNORMAL HIGH (ref 8–23)
CO2: 34 mmol/L — ABNORMAL HIGH (ref 22–32)
Calcium: 9.1 mg/dL (ref 8.9–10.3)
Chloride: 90 mmol/L — ABNORMAL LOW (ref 98–111)
Creatinine, Ser: 2.92 mg/dL — ABNORMAL HIGH (ref 0.61–1.24)
GFR, Estimated: 21 mL/min — ABNORMAL LOW (ref >60)
Glucose, Bld: 281 mg/dL — ABNORMAL HIGH (ref 70–99)
Potassium: 3.9 mmol/L (ref 3.5–5.1)
Sodium: 139 mmol/L (ref 135–145)

## 2022-02-15 LAB — GLUCOSE, CAPILLARY
Glucose-Capillary: 188 mg/dL — ABNORMAL HIGH (ref 70–99)
Glucose-Capillary: 161 mg/dL — ABNORMAL HIGH (ref 70–99)
Glucose-Capillary: 275 mg/dL — ABNORMAL HIGH (ref 70–99)
Glucose-Capillary: 291 mg/dL — ABNORMAL HIGH (ref 70–99)
Glucose-Capillary: 288 mg/dL — ABNORMAL HIGH (ref 70–99)

## 2022-02-15 LAB — CBC
HCT: 36.8 % — ABNORMAL LOW (ref 39.0–52.0)
Hemoglobin: 11.6 g/dL — ABNORMAL LOW (ref 13.0–17.0)
MCH: 29.1 pg (ref 26.0–34.0)
MCHC: 31.5 g/dL (ref 30.0–36.0)
MCV: 92.2 fL (ref 80.0–100.0)
Platelets: 183 10*3/uL (ref 150–400)
RBC: 3.99 MIL/uL — ABNORMAL LOW (ref 4.22–5.81)
RDW: 15 % (ref 11.5–15.5)
WBC: 6.4 10*3/uL (ref 4.0–10.5)
nRBC: 0 % (ref 0.0–0.2)

## 2022-02-15 LAB — MAGNESIUM: Magnesium: 2.4 mg/dL (ref 1.7–2.4)

## 2022-02-15 NOTE — Progress Notes (Signed)
Rounding Note    Patient Name: Kyle Roberson Date of Encounter: 02/15/2022  [place] Cardiologist: Ida Rogue, MD   Subjective   Edema is improving, diuresing adequately, no other acute events overnight  Inpatient Medications    Scheduled Meds:  apixaban  2.5 mg Oral BID   ascorbic acid  500 mg Oral Daily   cholecalciferol  1,000 Units Oral Daily   dorzolamide-timolol  1 drop Left Eye BID   doxazosin  1 mg Oral BID   hydrALAZINE  100 mg Oral TID   insulin aspart  0-5 Units Subcutaneous QHS   insulin aspart  0-9 Units Subcutaneous TID WC   isosorbide mononitrate  30 mg Oral Daily   multivitamin with minerals  1 tablet Oral Daily   omega-3 acid ethyl esters  1 g Oral Daily   rosuvastatin  5 mg Oral Once per day on Mon Wed Fri   sertraline  25 mg Oral Daily   Continuous Infusions:  furosemide (LASIX) 200 mg in dextrose 5 % 100 mL (2 mg/mL) infusion 8 mg/hr (02/14/22 1154)   PRN Meds: acetaminophen **OR** acetaminophen, ipratropium-albuterol, magnesium hydroxide, ondansetron **OR** ondansetron (ZOFRAN) IV, potassium chloride SA, traZODone   Vital Signs    Vitals:   02/15/22 0354 02/15/22 0500 02/15/22 0804 02/15/22 1253  BP: (!) 156/61  (!) 152/67 123/66  Pulse: (!) 49  (!) 55 61  Resp: 14  19 17   Temp: (!) 97.5 F (36.4 C)  97.8 F (36.6 C) 98 F (36.7 C)  TempSrc:   Oral Oral  SpO2: 98%  97% (!) 87%  Weight:  108.1 kg    Height:        Intake/Output Summary (Last 24 hours) at 02/15/2022 1426 Last data filed at 02/15/2022 0830 Gross per 24 hour  Intake 360 ml  Output 2350 ml  Net -1990 ml      02/15/2022    5:00 AM 02/14/2022    4:04 AM 02/12/2022    4:53 AM  Last 3 Weights  Weight (lbs) 238 lb 5.1 oz 253 lb 4.9 oz 249 lb 12.5 oz  Weight (kg) 108.1 kg 114.9 kg 113.3 kg      Telemetry    Atrial fibrillation, heart rate 62- Personally Reviewed  ECG     - Personally Reviewed  Physical Exam   GEN: Mild respiratory  distress Neck: No JVD Cardiac: Irregular irregular Respiratory: Diminished breath sounds at bases GI: Soft, nontender, distended  MS: 2-3+ edema Neuro:  Nonfocal  Psych: Normal affect  Labs    High Sensitivity Troponin:   Recent Labs  Lab 02/08/22 1919 02/08/22 2210  TROPONINIHS 22* 21*     Chemistry Recent Labs  Lab 02/08/22 1919 02/09/22 0525 02/12/22 1456 02/13/22 0825 02/14/22 0603 02/15/22 1227  NA 142   < > 137 141 140 139  K 4.7   < > 4.0 4.1 3.6 3.9  CL 105   < > 95* 95* 94* 90*  CO2 29   < > 28 33* 34* 34*  GLUCOSE 109*   < > 177* 121* 153* 281*  BUN 41*   < > 63* 70* 78* 82*  CREATININE 2.69*   < > 2.97* 3.06* 2.92* 2.92*  CALCIUM 9.0   < > 9.0 9.1 9.0 9.1  MG  --   --  2.3  --  2.5* 2.4  PROT 7.1  --   --   --   --   --   ALBUMIN 3.8  --   --   --   --   --  AST 18  --   --   --   --   --   ALT 13  --   --   --   --   --   ALKPHOS 75  --   --   --   --   --   BILITOT 1.0  --   --   --   --   --   GFRNONAA 23*   < > 20* 20* 21* 21*  ANIONGAP 8   < > 14 13 12 15    < > = values in this interval not displayed.    Lipids No results for input(s): "CHOL", "TRIG", "HDL", "LABVLDL", "LDLCALC", "CHOLHDL" in the last 168 hours.  Hematology Recent Labs  Lab 02/12/22 1456 02/14/22 0603 02/15/22 1227  WBC 9.1 6.5 6.4  RBC 3.80* 3.73* 3.99*  HGB 10.9* 10.9* 11.6*  HCT 35.0* 34.3* 36.8*  MCV 92.1 92.0 92.2  MCH 28.7 29.2 29.1  MCHC 31.1 31.8 31.5  RDW 15.8* 15.3 15.0  PLT 156 178 183   Thyroid No results for input(s): "TSH", "FREET4" in the last 168 hours.  BNP Recent Labs  Lab 02/08/22 1919  BNP 263.5*    DDimer No results for input(s): "DDIMER" in the last 168 hours.   Radiology    No results found.  Cardiac Studies   Echo 02/09/2022 EF 55 to 60%  Patient Profile     81 y.o. male with history of CAD/PCI, CKD 4, HFpEF, pulmonary hypertension, permanent atrial fibrillation presenting with worsening shortness of breath, being seen for HFpEF  and volume overload.  Assessment & Plan    1.  HFpEF -Still volume overloaded -Net -1.9 L over the past 24 hours, creatinine stable -Continue Lasix drip at 4 mg/h -Monitor ins and outs, Creatinine   2.  Atrial fibrillation -Heart rate controlled off AV nodal agents -Continue Eliquis 2.5 mg twice daily.   3.  CAD/remote PCI -No chest pain -EF normal -Eliquis, Crestor   4.  CKD 4 -Creatinine stable -Monitor with diuresis   Total encounter time more than 50 minutes  Greater than 50% was spent in counseling and coordination of care with the patient         Signed, Kate Sable, MD  02/15/2022, 2:26 PM

## 2022-02-15 NOTE — Progress Notes (Signed)
PROGRESS NOTE    Kyle Roberson  UUE:[id number] DOB: [birth year] DOA: 02/09/2022 PCP: Glean Hess, MD  245A/245A-AA  LOS: 6 days   Brief hospital course:   Assessment & Plan: Kyle Roberson is a 81 y.o. male with medical history significant for coronary artery disease, type 2 diabetes mellitus, GERD, hypertension and dyslipidemia, who presented to the emergency room with  abdominal distention and weight gain of more than 20 pounds over the last couple of weeks.  The patient has been getting subcutaneous and p.o. Lasix as well as Zaroxolyn.  Patient is also experiencing worsening dyspnea, orthopnea and PND.    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  Renal functions currently stable --cardiology consulted, was started on IV lasix 80 BID and switched to lasix gtt on 11/23 Plan: --cont lasix gtt per cardio --hold metolazone  Acute hypoxemic respiratory failure --O2 sats down to 86% on room air while working with PT on 11/22.  Due to pulm edema. --Pt said his outpatient doctor ordered him home oxygen, but he didn't want it. --weaned to RA   Elevated troponin --only 22 and 21.  Mild demand ischemia.   Type 2 diabetes mellitus with stage 4 chronic kidney disease With hyperglycemia and hypoglycemia takes Glipizide XL 10 mg daily for DM control. Patient reports that he has not used any insulin in over 6 months.  --d/c'ed glipizide due to hypoglycemia --ACHS and SSI sensitive scale   Paroxysmal atrial fibrillation (HCC) Rate well controlled without any rate controlling medications --cont low-dose Eliquis   CKD (chronic kidney disease) stage 4, GFR 15-29 ml/min (HCC) Creatinine currently stable around baseline. -Monitor renal function while he is being diuresed   Depression --cont home Zoloft   Morbid  obesity ([place]) Estimated body mass index is 41.36 kg/m as calculated from the following:   Height as of this encounter: 5\' 8"  (1.727 m).   Weight as of this encounter: 123.4 kg.    Essential hypertension Blood pressure currently elevated.   -Continue home hydralazine, Imdur, Cardura and diuretics   Dyslipidemia --cont statin  CAD with remote PCI --cont statin   DVT prophylaxis: HX:[id number] Code Status: Full code  Family Communication:  Level of care: Telemetry Cardiac Dispo:   The patient is from: home Anticipated d/c is to: home (pt declined SNF rehab) Anticipated d/c date is: 2-3 days   Subjective and Interval History:  On room air today, with reduced leg swelling.   Objective: Vitals:   02/15/22 0354 02/15/22 0500 02/15/22 0804 02/15/22 1253  BP: (!) 156/61  (!) 152/67 123/66  Pulse: (!) 49  (!) 55 61  Resp: 14  19 17   Temp: (!) 97.5 F (36.4 C)  97.8 F (36.6 C) 98 F (36.7 C)  TempSrc:   Oral Oral  SpO2: 98%  97% (!) 87%  Weight:  108.1 kg    Height:        Intake/Output Summary (Last 24 hours) at 02/15/2022 1650 Last data filed at 02/15/2022 0830 Gross per 24 hour  Intake 360 ml  Output 1800 ml  Net -1440 ml   Filed Weights   02/12/22 0453 02/14/22 0404 02/15/22 0500  Weight: 113.3 kg 114.9 kg 108.1 kg    Examination:   Constitutional: NAD, AAOx3 HEENT: conjunctivae and lids normal, EOMI CV: No cyanosis.   RESP: normal respiratory effort, on RA Extremities: reduced edema in BLE SKIN:  warm, dry Neuro: II - XII grossly intact.     Data Reviewed: I have personally reviewed labs and imaging studies  Time spent: 25 minutes  Enzo Bi, MD [facility] If 7PM-7AM, please contact night-coverage 02/15/2022, 4:50 PM

## 2022-02-16 DIAGNOSIS — I5033 Acute on chronic diastolic (congestive) heart failure: Secondary | ICD-10-CM | POA: Diagnosis not present

## 2022-02-16 LAB — CBC
HCT: 34.5 % — ABNORMAL LOW (ref 39.0–52.0)
Hemoglobin: 11 g/dL — ABNORMAL LOW (ref 13.0–17.0)
MCH: 28.9 pg (ref 26.0–34.0)
MCHC: 31.9 g/dL (ref 30.0–36.0)
MCV: 90.8 fL (ref 80.0–100.0)
Platelets: 175 10*3/uL (ref 150–400)
RBC: 3.8 MIL/uL — ABNORMAL LOW (ref 4.22–5.81)
RDW: 14.7 % (ref 11.5–15.5)
WBC: 5.9 10*3/uL (ref 4.0–10.5)
nRBC: 0 % (ref 0.0–0.2)

## 2022-02-16 LAB — BASIC METABOLIC PANEL
Anion gap: 14 (ref 5–15)
BUN: 85 mg/dL — ABNORMAL HIGH (ref 8–23)
CO2: 34 mmol/L — ABNORMAL HIGH (ref 22–32)
Calcium: 8.9 mg/dL (ref 8.9–10.3)
Chloride: 89 mmol/L — ABNORMAL LOW (ref 98–111)
Creatinine, Ser: 2.82 mg/dL — ABNORMAL HIGH (ref 0.61–1.24)
GFR, Estimated: 22 mL/min — ABNORMAL LOW (ref >60)
Glucose, Bld: 211 mg/dL — ABNORMAL HIGH (ref 70–99)
Potassium: 3.4 mmol/L — ABNORMAL LOW (ref 3.5–5.1)
Sodium: 137 mmol/L (ref 135–145)

## 2022-02-16 LAB — GLUCOSE, CAPILLARY
Glucose-Capillary: 221 mg/dL — ABNORMAL HIGH (ref 70–99)
Glucose-Capillary: 187 mg/dL — ABNORMAL HIGH (ref 70–99)
Glucose-Capillary: 268 mg/dL — ABNORMAL HIGH (ref 70–99)
Glucose-Capillary: 286 mg/dL — ABNORMAL HIGH (ref 70–99)

## 2022-02-16 LAB — MAGNESIUM: Magnesium: 2.4 mg/dL (ref 1.7–2.4)

## 2022-02-16 MED ORDER — POTASSIUM CHLORIDE CRYS ER 20 MEQ PO TBCR
40.0000 meq | EXTENDED_RELEASE_TABLET | Freq: Once | ORAL | Status: AC
  Administered 2022-02-16: 40 meq via ORAL
  Filled 2022-02-16: qty 2

## 2022-02-16 MED ORDER — LINAGLIPTIN 5 MG PO TABS
5.0000 mg | ORAL_TABLET | Freq: Every day | ORAL | Status: DC
  Administered 2022-02-16 – 2022-02-21 (×6): 5 mg via ORAL
  Filled 2022-02-16 (×6): qty 1

## 2022-02-16 NOTE — Care Management Important Message (Signed)
Important Message  Patient Details  Name: Kyle Roberson MRN: [id number] Date of Birth: 10/29/1940   Medicare Important Message Given:  N/A - LOS <3 / Initial given by admissions     [name] 02/16/2022, 10:51 AM

## 2022-02-16 NOTE — Progress Notes (Signed)
Occupational Therapy Treatment Patient Details Name: Kyle Roberson MRN: [id number] DOB: [birth year] Today's Date: 02/16/2022   History of present illness 81 y/o male presented to ED on 02/09/22 for abdominal distention and weight >20 lbs over last couple of weeks. Admitted for acute on chronic diastolic CHF. 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 Appearing alert; willing to work with OT on seated oral care; declines ambulation at this time; education provided on importance of ambulation. See flowsheet below for further details of session. Left seated in recliner with all needs in reach.  Pt declining SNF and [place] services.    Recommendations for follow up therapy are one component of a multi-disciplinary discharge planning process, led by the attending physician.  Recommendations may be updated based on patient status, additional functional criteria and insurance authorization.    Follow Up Recommendations  Follow physician's recommendations for discharge plan and follow up therapies (would benefit from SNF or HH, but is declining both)     Assistance Recommended at Discharge Frequent or constant Supervision/Assistance  Patient can return home with the following  A lot of help with walking and/or transfers;A lot of help with bathing/dressing/bathroom;Assistance with cooking/housework;Assist for transportation;Direct supervision/assist for medications management;Help with stairs or ramp for entrance   Equipment Recommendations  None recommended by OT    Recommendations for Other Services      Precautions / Restrictions Precautions Precautions: Fall Restrictions Weight Bearing Restrictions: No       Mobility Bed Mobility                    Transfers                         Balance                                           ADL either  performed or assessed with clinical judgement   ADL                                         General ADL Comments: Pt received in chair; refused to mobilize at this time. Did chair level oral care with set up/supervision. Able to open tight container of toothpaste with effort and extra time.    Extremity/Trunk Assessment Upper Extremity Assessment Upper Extremity Assessment: Overall WFL for tasks assessed   Lower Extremity Assessment Lower Extremity Assessment: Defer to PT evaluation        Vision       Perception     Praxis      Cognition Arousal/Alertness: Awake/alert Behavior During Therapy: WFL for tasks assessed/performed Overall Cognitive Status: No family/caregiver present to determine baseline cognitive functioning                                 General Comments: Pt stating he does not want to get out of the chair; will not give a specific reason for declining mobility. Decreased insight into the effects of not mobilizing and possibility of declining function.        Exercises      Shoulder  Instructions       General Comments Education provided on importance of mobility; pt continues to decline mobility at this time. Pt on room air throughout session. Asking when he can go home. Pt does admit that he is stubborn and waited too long to come to the hospital.    Pertinent Vitals/ Pain       Pain Assessment Pain Assessment: No/denies pain  Home Living                                          Prior Functioning/Environment              Frequency  Min 2X/week        Progress Toward Goals  OT Goals(current goals can now be found in the care plan section)  Progress towards OT goals: Not progressing toward goals - comment (Pt declining mobility and most therapy intervention)  Acute Rehab OT Goals Patient Stated Goal: Go home OT Goal Formulation: With patient Time For Goal Achievement:  02/25/22 Potential to Achieve Goals: Fair ADL Goals Pt Will Perform Lower Body Dressing: with modified independence;with adaptive equipment;sit to/from stand Pt Will Transfer to Toilet: with supervision;ambulating Pt Will Perform Toileting - Clothing Manipulation and hygiene: with modified independence Additional ADL Goal #1: Pt will identify at least 1 learned strategy to help improve self mgt of chronic disease to minimize risk of readmission.  Plan Frequency remains appropriate    Co-evaluation                 AM-PAC OT "6 Clicks" Daily Activity     Outcome Measure   Help from another person eating meals?: None Help from another person taking care of personal grooming?: None (from seated) Help from another person toileting, which includes using toliet, bedpan, or urinal?: A Lot Help from another person bathing (including washing, rinsing, drying)?: A Lot Help from another person to put on and taking off regular upper body clothing?: A Little Help from another person to put on and taking off regular lower body clothing?: A Lot 6 Click Score: 17    End of Session    OT Visit Diagnosis: Other abnormalities of gait and mobility (R26.89);Muscle weakness (generalized) (M62.81);Pain   Activity Tolerance     Patient Left in chair;with call bell/phone within reach   Nurse Communication          Time: 0960-4540 OT Time Calculation (min): 11 min  Charges: OT General Charges $OT Visit: 1 Visit OT Treatments $Self Care/Home Management : 8-22 mins  Waymon Amato, MS, OTR/L   Vania Rea 02/16/2022, 11:33 AM

## 2022-02-16 NOTE — Inpatient Diabetes Management (Signed)
Inpatient Diabetes Program Recommendations  AACE/ADA: New Consensus Statement on Inpatient Glycemic Control   Target Ranges:  Prepandial:   less than 140 mg/dL      Peak postprandial:   less than 180 mg/dL (1-2 hours)      Critically ill patients:  140 - 180 mg/dL    Latest Reference Range & Units 02/15/22 08:48 02/15/22 12:30 02/15/22 16:09 02/15/22 21:38 02/16/22 02:26 02/16/22 08:00  Glucose-Capillary 70 - 99 mg/dL 161 (H) 275 (H) 291 (H) 288 (H) 221 (H) 187 (H)   Review of Glycemic Control  Diabetes history: DM2 Outpatient Diabetes medications: Glipizide XL 10 mg QAM, NPH 10-15 units after breakfast and before bedtime (not taking) Current orders for Inpatient glycemic control: Novolog 0-9 units TID with meals, Novolog 0-5 units QHS  Inpatient Diabetes Program Recommendations:    Insulin: Per MAR, patient has not taken Novolog (charted as being refused) in over 24 hours.   Oral DM medication: May want to consider ordering Tradjenta 5 mg daily (low risk of hypoglycemia).  Thanks, Barnie Alderman, RN, MSN, [place] Diabetes Coordinator Inpatient Diabetes Program [phone] (Team Pager from 8am to [place])

## 2022-02-16 NOTE — Progress Notes (Signed)
PROGRESS NOTE    Kyle Roberson  DXA:[id number] DOB: 1940-05-09 DOA: 02/09/2022 PCP: Glean Hess, MD  245A/245A-AA  LOS: 7 days   Brief hospital course:   Assessment & Plan: Kyle Roberson is a 81 y.o. male with medical history significant for coronary artery disease, type 2 diabetes mellitus, GERD, hypertension and dyslipidemia, who presented to the emergency room with  abdominal distention and weight gain of more than 20 pounds over the last couple of weeks.  The patient has been getting subcutaneous and p.o. Lasix as well as Zaroxolyn.  Patient is also experiencing worsening dyspnea, orthopnea and PND.    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  Renal functions currently stable --cardiology consulted, was started on IV lasix 80 BID and switched to lasix gtt on 11/23 Plan: --cont lasix gtt per cardio --hold metolazone  Acute hypoxemic respiratory failure --O2 sats down to 86% on room air while working with PT on 11/22.  Due to pulm edema. --Pt said his outpatient doctor ordered him home oxygen, but he didn't want it. --weaned to RA   Elevated troponin --only 22 and 21.  Mild demand ischemia.   Type 2 diabetes mellitus with stage 4 chronic kidney disease With hyperglycemia and hypoglycemia takes Glipizide XL 10 mg daily for DM control. Patient reports that he has not used any insulin in over 6 months.  --d/c'ed glipizide due to hypoglycemia --ACHS and SSI sensitive scale   Paroxysmal atrial fibrillation (HCC) Rate well controlled without any rate controlling medications --cont low-dose Eliquis   CKD (chronic kidney disease) stage 4, GFR 15-29 ml/min (HCC) Creatinine currently stable around baseline. -Monitor renal function while he is being diuresed   Depression --cont home Zoloft   Morbid  obesity ([place]) Estimated body mass index is 41.36 kg/m as calculated from the following:   Height as of this encounter: 5\' 8"  (1.727 m).   Weight as of this encounter: 123.4 kg.    Essential hypertension Blood pressure currently elevated.   -Continue home hydralazine, Imdur, Cardura and diuretics   Dyslipidemia --cont statin  CAD with remote PCI --cont statin   DVT prophylaxis: MC:NOBSJGG Code Status: Full code  Family Communication:  Level of care: Telemetry Cardiac Dispo:   The patient is from: home Anticipated d/c is to: home (pt declined SNF rehab) Anticipated d/c date is: 1-2 days   Subjective and Interval History:  Pt had a lot of liquids in front of him and said he was drinking as much as he could.  Pt didn't seem to understand the idea of getting diuresed.  RN asked to put pt on fluid restriction.   Objective: Vitals:   02/15/22 2050 02/16/22 0447 02/16/22 0801 02/16/22 1523  BP: (!) 147/56 (!) 156/66 (!) 149/60 (!) 147/69  Pulse: (!) 55 (!) 47 (!) 52 (!) 50  Resp: 16 18 19 18   Temp: 97.6 F (36.4 C) 97.7 F (36.5 C) 97.9 F (36.6 C) 97.8 F (36.6 C)  TempSrc: Oral Oral Oral   SpO2: 94% 92% 90% 95%  Weight:      Height:        Intake/Output Summary (Last 24 hours) at 02/16/2022 1750 Last data filed at 02/16/2022 1430 Gross per 24 hour  Intake 480 ml  Output 600 ml  Net -120 ml   Filed Weights   02/12/22 0453 02/14/22 0404 02/15/22 0500  Weight: 113.3 kg  114.9 kg 108.1 kg    Examination:   Constitutional: NAD, AAOx3, sitting in recliner HEENT: conjunctivae and lids normal, EOMI CV: No cyanosis.   RESP: normal respiratory effort, on RA Extremities: edema in BLE Neuro: II - XII grossly intact.     Data Reviewed: I have personally reviewed labs and imaging studies  Time spent: 25 minutes  Enzo Bi, MD [facility] If 7PM-7AM, please contact night-coverage 02/16/2022, 5:50 PM

## 2022-02-16 NOTE — Plan of Care (Signed)

## 2022-02-16 NOTE — Progress Notes (Signed)
PT Cancellation Note  Patient Details Name: Kyle Roberson MRN: [id number] DOB: [birth year]   Cancelled Treatment:    Reason Eval/Treat Not Completed: Patient declined, no reason specified Patient up in chair on arrival but refuses PT tx session despite encouragement and stating "I'm not doing it. I don't feel like it." Will re-attempt as time and schedule allows.   Beata Beason A. Gilford Rile PT, DPT [facility] - Acute [facility]    Tamora Huneke A Lillyann Ahart 02/16/2022, 10:32 AM

## 2022-02-16 NOTE — Progress Notes (Signed)
Family request for nephrology consult for BUN and creatinine. Family is concern for increase risk of dialysis and will appreciate nephrology input with regulation of diuretics.

## 2022-02-16 NOTE — Progress Notes (Signed)
Progress Note  Patient Name: Kyle Roberson Date of Encounter: 02/16/2022  Primary Cardiologist: Rockey Situ  Subjective   No chest pain. Dyspnea continues to improve. Documented UOP 1.4 L for the past 24 hours, net - 15.1 L for the admission. Renal function remains abnormal, though relatively stable.   Inpatient Medications    Scheduled Meds:  apixaban  2.5 mg Oral BID   ascorbic acid  500 mg Oral Daily   cholecalciferol  1,000 Units Oral Daily   dorzolamide-timolol  1 drop Left Eye BID   doxazosin  1 mg Oral BID   hydrALAZINE  100 mg Oral TID   insulin aspart  0-5 Units Subcutaneous QHS   insulin aspart  0-9 Units Subcutaneous TID WC   isosorbide mononitrate  30 mg Oral Daily   multivitamin with minerals  1 tablet Oral Daily   omega-3 acid ethyl esters  1 g Oral Daily   rosuvastatin  5 mg Oral Once per day on Mon Wed Fri   sertraline  25 mg Oral Daily   Continuous Infusions:  furosemide (LASIX) 200 mg in dextrose 5 % 100 mL (2 mg/mL) infusion 4 mg/hr (02/15/22 1707)   PRN Meds: acetaminophen **OR** acetaminophen, ipratropium-albuterol, magnesium hydroxide, ondansetron **OR** ondansetron (ZOFRAN) IV, potassium chloride SA, traZODone   Vital Signs    Vitals:   02/15/22 1706 02/15/22 2050 02/16/22 0447 02/16/22 0801  BP: 137/67 (!) 147/56 (!) 156/66 (!) 149/60  Pulse: (!) 59 (!) 55 (!) 47 (!) 52  Resp: 18 16 18 19   Temp: 97.8 F (36.6 C) 97.6 F (36.4 C) 97.7 F (36.5 C) 97.9 F (36.6 C)  TempSrc: Oral Oral Oral Oral  SpO2: (!) 89% 94% 92% 90%  Weight:      Height:        Intake/Output Summary (Last 24 hours) at 02/16/2022 0847 Last data filed at 02/16/2022 0815 Gross per 24 hour  Intake 240 ml  Output 600 ml  Net -360 ml   Filed Weights   02/12/22 0453 02/14/22 0404 02/15/22 0500  Weight: 113.3 kg 114.9 kg 108.1 kg    Telemetry    Afib 40s to 60s bpm - Personally Reviewed  ECG    No new tracings - Personally Reviewed  Physical Exam   GEN: No  acute distress.   Neck: No JVD. Cardiac: IRIR, I/VI systolic murmur RUSB, no rubs, or gallops.  Respiratory: Diminished breath sounds along the bases bilaterally.  GI: Soft, nontender, non-distended.   MS: 2-3+ lower extremity edema; No deformity. Neuro:  Alert and oriented x 3; Nonfocal.  Psych: Normal affect.  Labs    Chemistry Recent Labs  Lab 02/14/22 0603 02/15/22 1227 02/16/22 0457  NA 140 139 137  K 3.6 3.9 3.4*  CL 94* 90* 89*  CO2 34* 34* 34*  GLUCOSE 153* 281* 211*  BUN 78* 82* 85*  CREATININE 2.92* 2.92* 2.82*  CALCIUM 9.0 9.1 8.9  GFRNONAA 21* 21* 22*  ANIONGAP 12 15 14      Hematology Recent Labs  Lab 02/14/22 0603 02/15/22 1227 02/16/22 0457  WBC 6.5 6.4 5.9  RBC 3.73* 3.99* 3.80*  HGB 10.9* 11.6* 11.0*  HCT 34.3* 36.8* 34.5*  MCV 92.0 92.2 90.8  MCH 29.2 29.1 28.9  MCHC 31.8 31.5 31.9  RDW 15.3 15.0 14.7  PLT 178 183 175    Cardiac EnzymesNo results for input(s): "TROPONINI" in the last 168 hours. No results for input(s): "TROPIPOC" in the last 168 hours.   BNPNo results for  input(s): "BNP", "PROBNP" in the last 168 hours.   DDimer No results for input(s): "DDIMER" in the last 168 hours.   Radiology    No results found.  Cardiac Studies   2D Echocardiogram 11.20.203    1. Left ventricular ejection fraction, by estimation, is 55 to 60%. The  left ventricle has normal function. The left ventricle has no regional  wall motion abnormalities. The left ventricular internal cavity size was  mildly dilated. There is mild left  ventricular hypertrophy. Left ventricular diastolic parameters are  indeterminate. There is the interventricular septum is flattened in  systole, consistent with right ventricular pressure overload.   2. Right ventricular systolic function is normal. The right ventricular  size is mildly enlarged. Mildly increased right ventricular wall  thickness. There is severely elevated pulmonary artery systolic pressure.  The  estimated right ventricular systolic  pressure is 83.3 mmHg.   3. Left atrial size was mildly dilated.   4. Right atrial size was mildly dilated.   5. The mitral valve is normal in structure. Mild mitral valve  regurgitation. No evidence of mitral stenosis.   6. Tricuspid valve regurgitation is moderate.   7. The aortic valve is calcified. Aortic valve regurgitation is mild.  Mild aortic valve stenosis. Aortic valve area, by VTI measures 1.45 cm.  Aortic valve mean gradient measures 7.0 mmHg.   8. Moderately dilated pulmonary artery.   9. The inferior vena cava is dilated in size with <50% respiratory  variability, suggesting right atrial pressure of 15 mmHg.   Patient Profile     81 y.o. 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    Assessment & Plan    1. 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  Further weight gain and edema despite outpt subcutaneous Lasix, prompting presentation 02/09/22.  Markedly volume overloaded on admission and up 23 kg since 02/2021.  Continues to respond well to IV diuresis.  Minus 1.4L yesterday and minus 15.1 since admission.  Still markedly volume overloaded on exam with some symptom improvement.  Renal function remains above baseline.  Continue Lasix gtt at 4 mg/hr.  If he continues to have an up trending renal function with ongoing evidence of volume overload, may need to consider RHC.  With poor hygiene at baseline and CKD IV, poor candidate for sglt2i.   2.  Supply demand Ischemia/CAD:  Status post prior LAD stenting and PTCA of the diagonal with residual 60% RCA disease per prior cards notes.  No recent ischemic  evaluation/cath.  No chest pain.  HsTrop minimally elevated at 22.  Echo with nl EF.  Suspect demand ischemia.  No plan for ischemic eval at this time.    3.  Essential HTN:  BP remains elevated in the 140's to 150's.  Currently on hydralazine 100mg  TID, Imdur, and Cardura.  Poor candidate for acei/arb/arni/mra in setting of renal failure.  Poor candidate for ? blocker/clonidine in setting of baseline bradycardia.  Poor candidate for amlodipine due to severe lower extremity swelling and chronic venous stasis.     4.  Acute on CKD IV:  BUN up trending to slightly improved SCr.  Follow with ongoing diuresis.   5.  HL:  Cont statin.  LDL 68 in  Feb.   6.  Permanent Afib:  Rate-controlled and sometimes slow, in the absence of AVN blocking agents.  Cont reduced dose Eliquis.      For questions or updates, please contact [place] Please consult [email or web address] for contact info under Cardiology/STEMI.    Signed, Christell Faith, PA-C [place] Pager: [phone] 02/16/2022, 8:47 AM

## 2022-02-16 NOTE — Progress Notes (Signed)
  Transition of Care [facility]) Screening Note   Patient Details  Name: Kyle Roberson Date of Birth: 22-Nov-1940   Transition of Care [facility]) CM/SW Contact:    Quin Hoop, LCSW Phone Number: 02/16/2022, 8:47 AM    Transition of Care Department [facility]) has reviewed patient and no TOC needs have been identified at this time. We will continue to monitor patient advancement through interdisciplinary progression rounds. If new patient transition needs arise, please place a TOC consult.   Pt has declined SNF and HH recs.

## 2022-02-17 DIAGNOSIS — I5033 Acute on chronic diastolic (congestive) heart failure: Secondary | ICD-10-CM | POA: Diagnosis not present

## 2022-02-17 LAB — BASIC METABOLIC PANEL
Anion gap: 11 (ref 5–15)
BUN: 92 mg/dL — ABNORMAL HIGH (ref 8–23)
CO2: 36 mmol/L — ABNORMAL HIGH (ref 22–32)
Calcium: 8.8 mg/dL — ABNORMAL LOW (ref 8.9–10.3)
Chloride: 90 mmol/L — ABNORMAL LOW (ref 98–111)
Creatinine, Ser: 2.72 mg/dL — ABNORMAL HIGH (ref 0.61–1.24)
GFR, Estimated: 22 mL/min — ABNORMAL LOW (ref >60)
Glucose, Bld: 207 mg/dL — ABNORMAL HIGH (ref 70–99)
Potassium: 3.7 mmol/L (ref 3.5–5.1)
Sodium: 137 mmol/L (ref 135–145)

## 2022-02-17 LAB — CBC
HCT: 33.8 % — ABNORMAL LOW (ref 39.0–52.0)
Hemoglobin: 10.9 g/dL — ABNORMAL LOW (ref 13.0–17.0)
MCH: 29.4 pg (ref 26.0–34.0)
MCHC: 32.2 g/dL (ref 30.0–36.0)
MCV: 91.1 fL (ref 80.0–100.0)
Platelets: 179 10*3/uL (ref 150–400)
RBC: 3.71 MIL/uL — ABNORMAL LOW (ref 4.22–5.81)
RDW: 14.7 % (ref 11.5–15.5)
WBC: 6.2 10*3/uL (ref 4.0–10.5)
nRBC: 0 % (ref 0.0–0.2)

## 2022-02-17 LAB — GLUCOSE, CAPILLARY
Glucose-Capillary: 170 mg/dL — ABNORMAL HIGH (ref 70–99)
Glucose-Capillary: 278 mg/dL — ABNORMAL HIGH (ref 70–99)
Glucose-Capillary: 288 mg/dL — ABNORMAL HIGH (ref 70–99)
Glucose-Capillary: 352 mg/dL — ABNORMAL HIGH (ref 70–99)

## 2022-02-17 LAB — MAGNESIUM: Magnesium: 2.5 mg/dL — ABNORMAL HIGH (ref 1.7–2.4)

## 2022-02-17 MED ORDER — POTASSIUM CHLORIDE CRYS ER 20 MEQ PO TBCR
20.0000 meq | EXTENDED_RELEASE_TABLET | Freq: Once | ORAL | Status: AC
  Administered 2022-02-17: 20 meq via ORAL
  Filled 2022-02-17: qty 1

## 2022-02-17 MED ORDER — GLIPIZIDE ER 10 MG PO TB24
10.0000 mg | ORAL_TABLET | Freq: Every day | ORAL | Status: DC
  Administered 2022-02-18: 10 mg via ORAL
  Filled 2022-02-17: qty 1

## 2022-02-17 MED ORDER — ACETAZOLAMIDE 250 MG PO TABS
250.0000 mg | ORAL_TABLET | Freq: Two times a day (BID) | ORAL | Status: AC
  Administered 2022-02-17 (×2): 250 mg via ORAL
  Filled 2022-02-17 (×2): qty 1

## 2022-02-17 NOTE — Plan of Care (Signed)
  Problem: Education: Goal: Knowledge of General Education information will improve Description: Including pain rating scale, medication(s)/side effects and non-pharmacologic comfort measures Outcome: Progressing   Problem: Clinical Measurements: Goal: Respiratory complications will improve Outcome: Progressing   Problem: Clinical Measurements: Goal: Cardiovascular complication will be avoided Outcome: Progressing   Problem: Activity: Goal: Risk for activity intolerance will decrease Outcome: Progressing   Problem: Pain Managment: Goal: General experience of comfort will improve Outcome: Progressing

## 2022-02-17 NOTE — Consult Note (Signed)
Advanced Heart Failure Team Consult Note   Primary Physician: Glean Hess, MD PCP-Cardiologist:  Ida Rogue, MD  Reason for Consultation: Acute on chronic diastolic CHF, pulmonary hypertension  HPI:    Kyle Roberson is seen today for evaluation of acute on chronic diastolic CHF, pulmonary hypertension at the request of Christell Faith PA/Dr. Fletcher Anon with [facility]. 81 y.o. 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   We are asked to see by Dr. Fletcher Anon for further management of his HF  Seen in Cardiology Clinic in October 2023. He was markedly volume overloaded. Admission for IV diuresis recommended, he declined. He was prescribed subQ lasix and metolazone. Despite, this he continued to gain weight and had worsening HF symptoms.  Presented to ED for for evaluation on 11/19. BNP 263, Scr 2.69 (baseline Scr had been variable, ranging 2-2.5), . HS troponin 22>21. CXR with evidence of CHF. He was given IV lasix and admitted for acute on chronic CHF. Cardiology consulted. He has been diuresing with lasix gtt and metolazone, currently negative 15L since admit. Scr trended up to 3.06 several days ago >> now improved to 2.72. Lasix gtt had been cut back to 4 mg/hr with decrease in UOP, lasix gtt subsequently increased back to 8 mg/hr this am. Remains persistently volume overloaded. Nephrology and Advanced Heart Failure asked to evaluate patient.   Echo 11/23 EF 55-60% RV moderately dilated/moderate HK. Mild AS RVSP 95mmHG. Has never had RHC  At baseline can ambulate in his home but says his significant other won't let him go out of the house. Can do ADLs without too much difficulty. He says she also handles his medications and helps him with daily weights. He does not know his meds or what his baseline weight is. His current weight in hospital is lower than all previous  weights I see in hospital. He denies CP, orthopnea or PND. He does not wear O2 at home. Never been tested for OSA    Review of Systems: [y] = yes, [ ]  = no   General: Weight gain [Y]; Weight loss [ ] ; Anorexia [ ] ; Fatigue [Y ]; Fever [ ] ; Chills [ ] ; Weakness [ ]   Cardiac: Chest pain/pressure [ ] ; Resting SOB [ ] ; Exertional SOB [Y]; Orthopnea [Y ]; Pedal Edema [Y]; Palpitations [ ] ; Syncope [ ] ; Presyncope [ ] ; Paroxysmal nocturnal dyspnea[ ]   Pulmonary: Cough [ ] ; Wheezing[ ] ; Hemoptysis[ ] ; Sputum [ ] ; Snoring [ ]   GI: Vomiting[ ] ; Dysphagia[ ] ; Melena[ ] ; Hematochezia [ ] ; Heartburn[ ] ; Abdominal pain [ ] ; Constipation [ ] ; Diarrhea [ ] ; BRBPR [ ]   GU: Hematuria[ ] ; Dysuria [ ] ; Nocturia[ ]   Vascular: Pain in legs with walking [ ] ; Pain in feet with lying flat [ ] ; Non-healing sores [ ] ; Stroke [ ] ; TIA [ ] ; Slurred speech [ ] ;  Neuro: Headaches[ ] ; Vertigo[ ] ; Seizures[ ] ; Paresthesias[ ] ;Blurred vision [ ] ; Diplopia [ ] ; Vision changes [ ]   Ortho/Skin: Arthritis [ y]; Joint pain [ y]; Muscle pain [ ] ; Joint swelling [ ] ; Back Pain [ ] ; Rash [ ]   Psych: Depression[ ] ; Anxiety[ ]   Heme: Bleeding problems [ ] ; Clotting disorders [ ] ; Anemia [Y]  Endocrine: Diabetes [Y]; Thyroid dysfunction[ ]   Home Medications Prior to Admission medications   Medication Sig Start Date End Date Taking? Authorizing Provider  apixaban (ELIQUIS) 2.5 MG  TABS tablet TAKE 1 TABLET(2.5 MG) BY MOUTH TWICE DAILY 08/27/21  Yes Gollan, Kathlene November, MD  Ascorbic Acid (VITAMIN C PO) Take 1 tablet by mouth in the morning.   Yes [provider]  Cholecalciferol (VITAMIN D3) 1.25 MG (50000 UT) CAPS Take 1 tablet by mouth in the morning.   Yes [provider]  Coenzyme Q10-Vitamin E (QUNOL ULTRA COQ10 PO) Take 1 capsule by mouth in the morning.   Yes [provider]  dorzolamide-timolol (COSOPT) 22.3-6.8 MG/ML ophthalmic solution Place 1 drop into the left eye 2 (two) times daily. 08/28/20  Yes  [provider]  doxazosin (CARDURA) 1 MG tablet Take 1 tablet (1 mg total) by mouth 2 (two) times daily. 04/07/21  Yes Gollan, Kathlene November, MD  Flaxseed, Linseed, (FLAXSEED OIL PO) Take 1 capsule by mouth in the morning.   Yes [provider]  Furosemide (FUROSCIX) 80 MG/10ML CTKT Inject 80 mg into the skin as directed. Takes Three times weekly   Yes [provider]  glipiZIDE (GLUCOTROL XL) 10 MG 24 hr tablet TAKE 1 TABLET(10 MG) BY MOUTH TWICE DAILY Patient taking differently: Take 10 mg by mouth daily with breakfast. TAKE 1 TABLET(10 MG) BY MOUTH TWICE DAILY 12/02/21  Yes Glean Hess, MD  hydrALAZINE (APRESOLINE) 100 MG tablet 100 mg three times a day, with extra 100 mg as needed for high pressure. 04/07/21  Yes Minna Merritts, MD  isosorbide mononitrate (IMDUR) 30 MG 24 hr tablet Take 3 tablets (90 mg) by mouth once daily 01/06/22  Yes Gollan, Kathlene November, MD  metolazone (ZAROXOLYN) 5 MG tablet Take 1 tablet (5 mg) by mouth 2-3 times per week. Take 30 minutes prior to torsemide 01/06/22  Yes Gollan, Kathlene November, MD  Multiple Vitamin (MULTIVITAMIN WITH MINERALS) TABS tablet Take 1 tablet by mouth in the morning.   Yes [provider]  Omega-3 Fatty Acids (FISH OIL PO) Take 1 capsule by mouth in the morning.   Yes [provider]  potassium chloride SA (KLOR-CON M) 20 MEQ tablet Take 1 tablet (20 mEq total) by mouth daily as needed (with metalozone). 01/06/22  Yes Gollan, Kathlene November, MD  rosuvastatin (CRESTOR) 5 MG tablet Take 1 tablet (5 mg total) by mouth 3 (three) times a week. 12/03/21  Yes Glean Hess, MD  torsemide (DEMADEX) 20 MG tablet Take 2 tablets (40 mg) by mouth once daily in the morning & take 1 tablet (20 mg) by mouth once daily at lunch time 01/06/22  Yes Gollan, Kathlene November, MD  Turmeric (QC TUMERIC COMPLEX PO) Take 1 capsule by mouth in the morning. Qunol Turmeric Curcumin- 40 MG   Yes [provider]  hydrALAZINE  (APRESOLINE) 50 MG tablet Take one tab as needed for elevated BP > 170 Patient not taking: Reported on 02/09/2022 04/07/21   Minna Merritts, MD  insulin NPH Human (NOVOLIN N) 100 UNIT/ML injection Inject 8-10 Units into the skin daily before breakfast. Take 10-15 units twice a day after breakfast and before bedtime per Sliding Scale (BS>250: 15 units, BS= 200-250:10 units) Patient not taking: Reported on 02/09/2022 11/30/17   [provider]  neomycin-polymyxin-dexameth (MAXITROL) 0.1 % OINT Place 1 Application into the right eye 3 (three) times daily. Patient not taking: Reported on 02/09/2022 12/02/21   Glean Hess, MD  nitroGLYCERIN (NITROLINGUAL) 0.4 MG/SPRAY spray Place 1 spray under the tongue as directed. Patient not taking: Reported on 02/09/2022 02/26/21 03/17/22  Cathlyn Parsons, PA-C  sertraline (ZOLOFT) 25 MG tablet Take 1 tablet (25 mg total) by mouth daily. Patient not taking: Reported on 02/09/2022 12/02/21   Glean Hess, MD    Past Medical History: Past Medical History:  Diagnosis Date   AKI (acute kidney injury) ([place]) 07/06/2018   CAD (coronary artery disease)    a. Remote PCI/stenting to LAD w PTCA Diagnoal. RCA 60%; b.  2005/2008 Cardiolites w/ reportedly mild ischemia in Diag territory-->Med rx.   Cellulitis of lower extremity 05/31/2019   Chronic heart failure with preserved ejection fraction (HFpEF) ([place])    a. 04/2019 Echo: EF 55-60%, no rwma, nl RV fxn, RVSP 55.67mmHg. Mod dil LA. Mild-mod MR. Mod dil PA.   CKD (chronic kidney disease), stage IV (HCC)    Diabetes (HCC)    GERD (gastroesophageal reflux disease)    History of MI (myocardial infarction) 06/27/2014   HLD (hyperlipidemia)    HTN (hypertension)    PAH (pulmonary artery hypertension) ([place])    a. 04/2019 Echo: RVSP 55.39mmHg.   Permanent atrial fibrillation (HCC)    a. CHA2DS2VASc = 5-->dose adjusted eliquis (age/creat).   Subdural hematoma ([place])    a. 01/2021 in setting of fall s/p L  frontotemporal craniotomy.    Past Surgical History: Past Surgical History:  Procedure Laterality Date   BLEPHAROPLASTY     CARDIAC CATHETERIZATION     CORONARY STENT INTERVENTION     CRANIOTOMY Left 02/04/2021   Procedure: CRANIOTOMY FOR LEFT SUBDURAL  HEMATOMA EVACUATION;  Surgeon: Deetta Perla, MD;  Location: [facility] ORS;  Service: Neurosurgery;  Laterality: Left;   CYSTOSCOPY     HERNIA REPAIR      Family History: Family History  Problem Relation Age of Onset   Pancreatic cancer Mother    CAD Father    Diabetes Brother     Social History: Social History   Socioeconomic History   Marital status: Married    Spouse name: Not on file   Number of children: Not on file   Years of education: Not on file   Highest education level: Not on file  Occupational History   Not on file  Tobacco Use   Smoking status: Former   Smokeless tobacco: Never  Vaping Use   Vaping Use: Never used  Substance and Sexual Activity   Alcohol use: Not Currently    Alcohol/week: 2.0 standard drinks of alcohol    Types: 2 Glasses of wine per week   Drug use: Not Currently   Sexual activity: Not on file  Other Topics Concern   Not on file  Social History Narrative   Lives at home with wife   Social Determinants of Health   Financial Resource Strain: Not on file  Food Insecurity: No Food Insecurity (02/11/2022)   Hunger Vital Sign    Worried About Running Out of Food in the Last Year: Never true    Ran Out of Food in the Last Year: Never true  Transportation Needs: No Transportation Needs (02/11/2022)   PRAPARE - Hydrologist (Medical): No    Lack of Transportation (Non-Medical): No  Physical Activity: Not on file  Stress: Not on file  Social Connections: Not on file    Allergies:  Allergies  Allergen Reactions   Atorvastatin Hives, Itching and Other (See Comments)    Other reaction(s): Other (See Comments)   Simvastatin Hives, Itching and Other (See  Comments)    Other reaction(s): Other (See Comments)    Objective:  Vital Signs:   Temp:  [97.5 F (36.4 C)-98.9 F (37.2 C)] 97.9 F (36.6 C) (11/28 1140) Pulse Rate:  [48-55] 53 (11/28 1140) Resp:  [14-20] 14 (11/28 1140) BP: (139-159)/(47-69) 159/61 (11/28 1140) SpO2:  [90 %-95 %] 95 % (11/28 1140) Weight:  [105.7 kg] 105.7 kg (11/28 0500) Last BM Date : 02/13/22  Weight change: Filed Weights   02/14/22 0404 02/15/22 0500 02/17/22 0500  Weight: 114.9 kg 108.1 kg 105.7 kg    Intake/Output:   Intake/Output Summary (Last 24 hours) at 02/17/2022 1254 Last data filed at 02/16/2022 1802 Gross per 24 hour  Intake 480 ml  Output 300 ml  Net 180 ml      Physical Exam    General:  Elderly obese male sitting in chair. No distress. HEENT: normal. Poor dentition Neck: supple. JVP 10 . Carotids 2+ bilat; no bruits. Cor: PMI nondisplaced. Irregular rhythm. 2/6 AS Lungs: diminished in bases no wheeze Abdomen: obese, soft, nontender, nondistended.  Good bowel sounds Extremities: no cyanosis, clubbing, severe chronic venous stasis changes b/l LE, 2+ edema Neuro: alert & orientedx3, cranial nerves grossly intact. moves all 4 extremities w/o difficulty. Affect pleasant    Telemetry   AF 50s Personally reviewed  EKG    AF 57 Personally reviewed  Labs   Basic Metabolic Panel: Recent Labs  Lab 02/12/22 1456 02/13/22 0825 02/14/22 0603 02/15/22 1227 02/16/22 0457 02/17/22 0616  NA 137 141 140 139 137 137  K 4.0 4.1 3.6 3.9 3.4* 3.7  CL 95* 95* 94* 90* 89* 90*  CO2 28 33* 34* 34* 34* 36*  GLUCOSE 177* 121* 153* 281* 211* 207*  BUN 63* 70* 78* 82* 85* 92*  CREATININE 2.97* 3.06* 2.92* 2.92* 2.82* 2.72*  CALCIUM 9.0 9.1 9.0 9.1 8.9 8.8*  MG 2.3  --  2.5* 2.4 2.4 2.5*    Liver Function Tests: No results for input(s): "AST", "ALT", "ALKPHOS", "BILITOT", "PROT", "ALBUMIN" in the last 168 hours. No results for input(s): "LIPASE", "AMYLASE" in the last 168  hours. No results for input(s): "AMMONIA" in the last 168 hours.  CBC: Recent Labs  Lab 02/12/22 1456 02/14/22 0603 02/15/22 1227 02/16/22 0457 02/17/22 0616  WBC 9.1 6.5 6.4 5.9 6.2  HGB 10.9* 10.9* 11.6* 11.0* 10.9*  HCT 35.0* 34.3* 36.8* 34.5* 33.8*  MCV 92.1 92.0 92.2 90.8 91.1  PLT 156 178 183 175 179    Cardiac Enzymes: No results for input(s): "CKTOTAL", "CKMB", "CKMBINDEX", "TROPONINI" in the last 168 hours.  BNP: BNP (last 3 results) Recent Labs    08/25/21 1751 12/02/21 1657 02/08/22 1919  BNP 185.6* 260.5* 263.5*    ProBNP (last 3 results) No results for input(s): "PROBNP" in the last 8760 hours.   CBG: Recent Labs  Lab 02/16/22 0800 02/16/22 1519 02/16/22 2249 02/17/22 0820 02/17/22 1141  GLUCAP 187* 268* 286* 170* 278*    Coagulation Studies: No results for input(s): "LABPROT", "INR" in the last 72 hours.   Imaging   No results found.   Medications:     Current Medications:  apixaban  2.5 mg Oral BID   ascorbic acid  500 mg Oral Daily   cholecalciferol  1,000 Units Oral Daily   dorzolamide-timolol  1 drop Left Eye BID   doxazosin  1 mg Oral BID   hydrALAZINE  100 mg Oral TID   insulin aspart  0-5 Units Subcutaneous QHS   insulin aspart  0-9 Units Subcutaneous TID WC   isosorbide mononitrate  30 mg  Oral Daily   linagliptin  5 mg Oral Daily   multivitamin with minerals  1 tablet Oral Daily   omega-3 acid ethyl esters  1 g Oral Daily   potassium chloride  20 mEq Oral Once   rosuvastatin  5 mg Oral Once per day on Mon Wed Fri    Infusions:  furosemide (LASIX) 200 mg in dextrose 5 % 100 mL (2 mg/mL) infusion 8 mg/hr (02/17/22 1015)      Patient Profile   81 y.o. male with history of chronic diastolic CHF, CAD s/p PCI to LAD and diagonal, stage IV CKD, permanent AF, obesity, DM II, hx traumatic subdural hematoma s/p craniotomy.  Admitted with massive volume overload after failing outpatient diuretics.  Assessment/Plan  Acute  on chronic diastolic CHF with promine R-sided HF:  Echo 02/21: EF 55-60%, RV okay, RVSP 56 mmHg, dilated pulmonary artery, dilated IVC -Echo 11/23: EF 55-60%, interventricular septum flattened in systole consistent with RV pressure overload, RV function okay, RVSP severely elevated 83 mmHg, mild MR, moderate TR, dilated pulmonary artery, dilated IVC -Admitted with massive volume overload (> 23 kg since January) after failing outpatient furoscix and metolazone -NYHA III. Has diuresed 39 lb so far with IV lasix. Lasix gtt had been cut back with slowing of UOP, now back to 8 mg/hr. Scr now 2.7 (has been fluctuating between 2-2.5 PTA). Still with evidence of volume overload on exam. CO2 elevated.  - Will increase lasix gtt to 12/hr and give 2 doses of diamox. Follow renal function closely. Suspect he is nearing as dry as we can get him though urine is still light-colored and seems like he will tolerate more diuresis  - Place TED hose -GDMT limited by renal impairment -Avoid SGLT2i with obesity and poor hygiene -Continue hydralazine 100 mg TID + imdur 30 daily  2. 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  - would continue diuresis - consider outpatient sleep study/ONOX  - doubt he will comply with CPAP but may benefit from nocturnal O2 - I do not think RHC will change our management except to ensure that we have him adequately diuresed. Will defer for now.  3. CAD: -Hx remote stenting to LAD and diagonal -Minimal troponin elevation on admit likely d/t demand ischemia -No s/s.Marland Kitchen No further ischemic workup planned -Continue statin -No aspirin since anticoagulated  4. Permanent atrial fibrillation: -Rate 50s without rate control  meds -On Eliquis 2.5 mg BID (appropriate dose for age and renal function)  5. AKI on CKD IV: -Scr variable, has been averaging 2-2.5, up to 3 this admit with diuresis. Down to 2.7 today.  -Suspect cardiorenal -Nephrology consulted - Follow BMETs  6. HTN: -On Cardura 1 mg BID, hydralazine 100 TID, imdur 30 daily -Options limited by renal impairment -BP remains elevated  -With CKD IV would not push BP too hard.    Length of Stay: 8  Kyle Bickers, MD  1:51 PM  Advanced Heart Failure Team Pager [phone] (M-F; 7a - 5p)  Please contact [facility] for night-coverage after hours (4p -7a ) and weekends on [email or web address]

## 2022-02-17 NOTE — Progress Notes (Signed)
Physical Therapy Treatment Patient Details Name: Kyle Roberson MRN: [id number] DOB: Nov 15, 1940 Today's Date: 02/17/2022   History of Present Illness Pt is an 81 y/o male presented to ED on 02/09/22 for abdominal distention and weight >20 lbs over last couple of weeks. Admitted for acute on chronic diastolic CHF, pulmonary HTN, and respiratory failure. PMH: CAD, T2DM, HTN, CKD stage 4, Afib, pulmonary HTN, traumatic SDH s/p craniotomy (11/22)    PT Comments    Pt was pleasant and put forth good effort throughout the session with some encouragement and education on physiological benefits of activity.  Of note, pt found on room air with SpO2 ranging from 84-89%. Pt returned to 2LO2/min with SpO2 quickly increasing to the upper 90s, nsg notified.  Pt tolerated below therex well including manual resistance exercises as tolerated.  Pt was able to stand with extra time, effort, and use of bilateral arm rests and to amb around 10 feet with slow but steady cadence.  No adverse symptoms noted during the session.  Pt will benefit from PT services in a SNF setting upon discharge to safely address deficits listed in patient problem list for decreased caregiver assistance and eventual return to PLOF.     Recommendations for follow up therapy are one component of a multi-disciplinary discharge planning process, led by the attending physician.  Recommendations may be updated based on patient status, additional functional criteria and insurance authorization.  Follow Up Recommendations  Skilled nursing-short term rehab (<3 hours/day) Can patient physically be transported by private vehicle: Yes   Assistance Recommended at Discharge Frequent or constant Supervision/Assistance  Patient can return home with the following A little help with walking and/or transfers;A little help with bathing/dressing/bathroom;Assistance with cooking/housework;Direct supervision/assist for medications management;Assist for  transportation;Help with stairs or ramp for entrance   Equipment Recommendations  None recommended by PT    Recommendations for Other Services       Precautions / Restrictions Precautions Precautions: Fall Precaution Comments: 2LO2 Restrictions Weight Bearing Restrictions: No     Mobility  Bed Mobility               General bed mobility comments: NT, pt in recliner    Transfers Overall transfer level: Needs assistance Equipment used: Rolling walker (2 wheels) Transfers: Sit to/from Stand Sit to Stand: Min guard           General transfer comment: Mod verbal cues for sequencing with pt requiring BUE assist on arm rests along with extra time and effort to come to standing    Ambulation/Gait Ambulation/Gait assistance: Min guard Gait Distance (Feet): 10 Feet Assistive device: Rolling walker (2 wheels) Gait Pattern/deviations: Wide base of support, Step-through pattern, Decreased step length - right, Decreased step length - left, Trunk flexed Gait velocity: decreased     General Gait Details: Pt able to amb 10 feet with very slow cadence and short B step length but steady without LOB   Stairs             Wheelchair Mobility    Modified Rankin (Stroke Patients Only)       Balance Overall balance assessment: Needs assistance Sitting-balance support: Bilateral upper extremity supported, Feet supported Sitting balance-Leahy Scale: Fair     Standing balance support: Bilateral upper extremity supported, Reliant on assistive device for balance, During functional activity Standing balance-Leahy Scale: Fair  Cognition Arousal/Alertness: Awake/alert Behavior During Therapy: WFL for tasks assessed/performed Overall Cognitive Status: Within Functional Limits for tasks assessed                                          Exercises Total Joint Exercises Ankle Circles/Pumps: Strengthening, Both,  10 reps, 15 reps (with manual resistance) Towel Squeeze: Strengthening, Both, 10 reps, 15 reps Long Arc Quad: Strengthening, Both, 10 reps, 15 reps (with manual resistance) Knee Flexion: Strengthening, Both, 10 reps, 15 reps (with manual resistance) Marching in Standing: Strengthening, Both, 5 reps, Standing Other Exercises Other Exercises: Pt education on physiological benefits of activity    General Comments        Pertinent Vitals/Pain Pain Assessment Pain Assessment: No/denies pain    Home Living                          Prior Function            PT Goals (current goals can now be found in the care plan section) Progress towards PT goals: Progressing toward goals    Frequency    Min 2X/week      PT Plan Current plan remains appropriate    Co-evaluation              AM-PAC PT "6 Clicks" Mobility   Outcome Measure  Help needed turning from your back to your side while in a flat bed without using bedrails?: A Little Help needed moving from lying on your back to sitting on the side of a flat bed without using bedrails?: A Little Help needed moving to and from a bed to a chair (including a wheelchair)?: A Little Help needed standing up from a chair using your arms (e.g., wheelchair or bedside chair)?: A Little Help needed to walk in hospital room?: A Little Help needed climbing 3-5 steps with a railing? : A Lot 6 Click Score: 17    End of Session Equipment Utilized During Treatment: Gait belt;Oxygen Activity Tolerance: Patient tolerated treatment well Patient left: in chair;with call bell/phone within reach Nurse Communication: Mobility status PT Visit Diagnosis: Unsteadiness on feet (R26.81);Other abnormalities of gait and mobility (R26.89);Muscle weakness (generalized) (M62.81);Difficulty in walking, not elsewhere classified (R26.2)     Time: 3220-2542 PT Time Calculation (min) (ACUTE ONLY): 24 min  Charges:  $Gait Training: 8-22  mins $Therapeutic Exercise: 8-22 mins                     D. Scott Drucella Karbowski PT, DPT 02/17/22, 4:30 PM

## 2022-02-17 NOTE — Progress Notes (Signed)
Pt refusing insulin. MD notified and aware. Per Billie Ruddy, MD, continue to document refusal.

## 2022-02-17 NOTE — Progress Notes (Signed)
Pt refused nasal cannula at this time but made aware of its importance. Pt oxygen saturation at 97 %. Will continue to monitor.

## 2022-02-17 NOTE — Progress Notes (Signed)
PROGRESS NOTE    Bard Haupert  NID:[id number] DOB: Jan 14, 1941 DOA: 02/09/2022 PCP: Glean Hess, MD  [id number]  LOS: 8 days   Brief hospital course:   Assessment & Plan: Vick Filter is a 81 y.o. male with medical history significant for coronary artery disease, type 2 diabetes mellitus, CKD4, hypertension, who presented to the emergency room with abdominal distention and weight gain of more than 20 pounds over the last couple of weeks.  The patient has been getting subcutaneous and p.o. Lasix as well as Zaroxolyn.  Patient is also experiencing worsening dyspnea, orthopnea and PND.    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  Renal functions currently stable --cardiology consulted, was started on IV lasix 80 BID and switched to lasix gtt on 11/23 Plan: --cont lasix gtt per cardio  Acute hypoxemic respiratory failure --O2 sats down to 86% on room air while working with PT on 11/22.  Due to pulm edema. --Pt said his outpatient doctor ordered him home oxygen, but he didn't want it. --still desat on room air needing 2L O2   Elevated troponin --only 22 and 21.  Mild demand ischemia.   Type 2 diabetes mellitus with stage 4 chronic kidney disease With hyperglycemia and hypoglycemia takes Glipizide XL 10 mg daily for DM control. Patient reports that he has not used any insulin in over 6 months.  --pt has been refusing SSI Plan: --resume home glipizide today since pt has been refusing SSI --ACHS and SSI sensitive scale   Paroxysmal atrial fibrillation ([place]) Rate well controlled without any rate controlling medications --cont low-dose Eliquis   CKD (chronic kidney disease) stage 4, GFR 15-29 ml/min (HCC) Creatinine currently stable around baseline. -Monitor renal function while he is being  diuresed --nephrology consult today    Depression --cont home Zoloft   Morbid obesity ([place]) Estimated body mass index is 41.36 kg/m as calculated from the following:   Height as of this encounter: 5\' 8"  (1.727 m).   Weight as of this encounter: 123.4 kg.    Essential hypertension Blood pressure currently elevated.   -Continue home hydralazine, Imdur, Cardura and diuretics   Dyslipidemia --cont statin  CAD with remote PCI --cont statin   DVT prophylaxis: RW:ERXVQMG Code Status: Full code  Family Communication:  Level of care: Telemetry Cardiac Dispo:   The patient is from: home Anticipated d/c is to: home (pt declined SNF rehab) Anticipated d/c date is: undetermined, currently still on lasix gtt   Subjective and Interval History:  Pt has been refusing SSI.  Still found to desat in room air.    Nephrology consulted per family request.   Objective: Vitals:   02/17/22 0820 02/17/22 1140 02/17/22 1647 02/17/22 1921  BP: (!) 145/61 (!) 159/61 (!) 156/65 (!) 131/53  Pulse: (!) 50 (!) 53 (!) 45 (!) 49  Resp: 16 14 16 18   Temp: 98.9 F (37.2 C) 97.9 F (36.6 C) 97.8 F (36.6 C) 97.6 F (36.4 C)  TempSrc:    Oral  SpO2: 90% 95% 100% 97%  Weight:      Height:        Intake/Output Summary (Last 24 hours) at 02/17/2022 1934 Last data filed at 02/17/2022 1921 Gross per 24 hour  Intake --  Output 1000 ml  Net -1000 ml   Filed Weights   02/14/22 0404 02/15/22 0500 02/17/22 0500  Weight: 114.9 kg 108.1  kg 105.7 kg    Examination:   Constitutional: NAD, AAOx3 HEENT: conjunctivae and lids normal, EOMI CV: No cyanosis.   RESP: normal respiratory effort Extremities: edema in BLE Neuro: II - XII grossly intact.     Data Reviewed: I have personally reviewed labs and imaging studies  Time spent: 35 minutes  Enzo Bi, MD [facility] If 7PM-7AM, please contact night-coverage 02/17/2022, 7:34 PM

## 2022-02-17 NOTE — Progress Notes (Signed)
Progress Note  Patient Name: Kyle Roberson Date of Encounter: 02/17/2022  Primary Cardiologist: Rockey Situ  Subjective   No chest pain. Dyspnea unchanged over the past 24 hours. Documented UOP decreased at 300 mL for the past 24 hours, net - 14.0 L for the admission on lower dose Lasix gtt. Renal function remains abnormal, though relatively stable.   Inpatient Medications    Scheduled Meds:  apixaban  2.5 mg Oral BID   ascorbic acid  500 mg Oral Daily   cholecalciferol  1,000 Units Oral Daily   dorzolamide-timolol  1 drop Left Eye BID   doxazosin  1 mg Oral BID   hydrALAZINE  100 mg Oral TID   insulin aspart  0-5 Units Subcutaneous QHS   insulin aspart  0-9 Units Subcutaneous TID WC   isosorbide mononitrate  30 mg Oral Daily   linagliptin  5 mg Oral Daily   multivitamin with minerals  1 tablet Oral Daily   omega-3 acid ethyl esters  1 g Oral Daily   rosuvastatin  5 mg Oral Once per day on Mon Wed Fri   Continuous Infusions:  furosemide (LASIX) 200 mg in dextrose 5 % 100 mL (2 mg/mL) infusion 4 mg/hr (02/15/22 1707)   PRN Meds: acetaminophen **OR** acetaminophen, ipratropium-albuterol, magnesium hydroxide, ondansetron **OR** ondansetron (ZOFRAN) IV, traZODone   Vital Signs    Vitals:   02/16/22 2015 02/16/22 2327 02/17/22 0402 02/17/22 0500  BP: (!) 155/58 (!) 149/47 139/64   Pulse: (!) 51 (!) 48 (!) 55   Resp: 20 16 19    Temp: (!) 97.5 F (36.4 C) 97.7 F (36.5 C) 98.3 F (36.8 C)   TempSrc:  Oral Oral   SpO2: 92% 92% 90%   Weight:    105.7 kg  Height:        Intake/Output Summary (Last 24 hours) at 02/17/2022 0808 Last data filed at 02/16/2022 1802 Gross per 24 hour  Intake 720 ml  Output 300 ml  Net 420 ml    Filed Weights   02/14/22 0404 02/15/22 0500 02/17/22 0500  Weight: 114.9 kg 108.1 kg 105.7 kg    Telemetry    Afib 40s to 60s bpm - Personally Reviewed  ECG    No new tracings - Personally Reviewed  Physical Exam   GEN: No acute  distress.   Neck: No JVD. Cardiac: IRIR, I/VI systolic murmur RUSB, no rubs, or gallops.  Respiratory: Diminished breath sounds along the bases bilaterally.  GI: Soft, nontender, non-distended.   MS: Softer, 2+ lower extremity edema; No deformity. Neuro:  Alert and oriented x 3; Nonfocal.  Psych: Normal affect.  Labs    Chemistry Recent Labs  Lab 02/15/22 1227 02/16/22 0457 02/17/22 0616  NA 139 137 137  K 3.9 3.4* 3.7  CL 90* 89* 90*  CO2 34* 34* 36*  GLUCOSE 281* 211* 207*  BUN 82* 85* 92*  CREATININE 2.92* 2.82* 2.72*  CALCIUM 9.1 8.9 8.8*  GFRNONAA 21* 22* 22*  ANIONGAP 15 14 11       Hematology Recent Labs  Lab 02/15/22 1227 02/16/22 0457 02/17/22 0616  WBC 6.4 5.9 6.2  RBC 3.99* 3.80* 3.71*  HGB 11.6* 11.0* 10.9*  HCT 36.8* 34.5* 33.8*  MCV 92.2 90.8 91.1  MCH 29.1 28.9 29.4  MCHC 31.5 31.9 32.2  RDW 15.0 14.7 14.7  PLT 183 175 179     Cardiac EnzymesNo results for input(s): "TROPONINI" in the last 168 hours. No results for input(s): "TROPIPOC" in the last  168 hours.   BNPNo results for input(s): "BNP", "PROBNP" in the last 168 hours.   DDimer No results for input(s): "DDIMER" in the last 168 hours.   Radiology    No results found.  Cardiac Studies   2D Echocardiogram 11.20.203    1. Left ventricular ejection fraction, by estimation, is 55 to 60%. The  left ventricle has normal function. The left ventricle has no regional  wall motion abnormalities. The left ventricular internal cavity size was  mildly dilated. There is mild left  ventricular hypertrophy. Left ventricular diastolic parameters are  indeterminate. There is the interventricular septum is flattened in  systole, consistent with right ventricular pressure overload.   2. Right ventricular systolic function is normal. The right ventricular  size is mildly enlarged. Mildly increased right ventricular wall  thickness. There is severely elevated pulmonary artery systolic pressure.   The estimated right ventricular systolic  pressure is 00.7 mmHg.   3. Left atrial size was mildly dilated.   4. Right atrial size was mildly dilated.   5. The mitral valve is normal in structure. Mild mitral valve  regurgitation. No evidence of mitral stenosis.   6. Tricuspid valve regurgitation is moderate.   7. The aortic valve is calcified. Aortic valve regurgitation is mild.  Mild aortic valve stenosis. Aortic valve area, by VTI measures 1.45 cm.  Aortic valve mean gradient measures 7.0 mmHg.   8. Moderately dilated pulmonary artery.   9. The inferior vena cava is dilated in size with <50% respiratory  variability, suggesting right atrial pressure of 15 mmHg.   Patient Profile     81 y.o. 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    Assessment & Plan    1. 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  Further weight gain and edema despite outpt subcutaneous Lasix, prompting presentation 02/09/22.  Markedly volume overloaded on admission and up 23 kg since 02/2021.  Continues to be volume up.  With decreasing of Lasix gtt, UOP decreased. Minus Net positive yesterday and minus 14.9 since admission.  Still markedly volume overloaded on exam with some symptom improvement, likely 10-15 pounds.   Renal function remains above baseline, family has requested nephrology consult.  Titrate Lasix gtt back to 8 mg/hr given he remains significantly volume up.  If he continues to have an up trending renal function with ongoing evidence of volume overload, may need to consider RHC.  With poor hygiene at baseline and CKD IV, poor candidate for sglt2i.  Advanced  heart failure will see the patient in consult this admission.    2.  Supply demand Ischemia/CAD:  Status post prior LAD stenting and PTCA of the diagonal with residual 60% RCA disease per prior cards notes.  No recent ischemic evaluation/cath.  No chest pain.  HsTrop minimally elevated at 22.  Echo with nl EF.  Suspect demand ischemia.  No plan for ischemic eval at this time.    3.  Essential HTN:  BP remains elevated in the 140's to 150's.  Currently on hydralazine 100mg  TID, Imdur, and Cardura.  Poor candidate for acei/arb/arni/mra in setting of renal failure.  Poor candidate for ? blocker/clonidine in setting of baseline bradycardia.  Poor candidate for amlodipine due to severe  lower extremity swelling and chronic venous stasis.     4.  Acute on CKD IV:  BUN up trending to slightly improved SCr.  Follow with ongoing diuresis.  Family has requested nephrology consult.    5.  HL:  Cont statin.  LDL 68 in Feb.   6.  Permanent Afib:  Rate-controlled and sometimes slow, in the absence of AVN blocking agents.  Cont reduced dose Eliquis.      For questions or updates, please contact [place] Please consult [email or web address] for contact info under Cardiology/STEMI.    Signed, Christell Faith, PA-C [place] Pager: [phone] 02/17/2022, 8:08 AM

## 2022-02-17 NOTE — Progress Notes (Signed)
[place], [place] 02/17/22  Subjective:   Hospital day # 8 Patient known to our practice from previous outpatient follow-up.Last seen in June 2022.  He is normally followed for CKD stage IV. This admission, he presented with volume overload and was placed on IV furosemide.  Based on the ins and outs flowsheet, he is net negative by almost 15 L since admission.  Review of flowsheets indicate a weight of 123 kg on November 19 which is down to 105.7 kg today.   Renal: 11/27 0701 - 11/28 0700 In: 720 [P.O.:720] Out: 300 [Urine:300] Lab Results  Component Value Date   CREATININE 2.72 (H) 02/17/2022   CREATININE 2.82 (H) 02/16/2022   CREATININE 2.92 (H) 02/15/2022     Objective:  Vital signs in last 24 hours:  Temp:  [97.5 F (36.4 C)-98.9 F (37.2 C)] 97.9 F (36.6 C) (11/28 1140) Pulse Rate:  [48-55] 53 (11/28 1140) Resp:  [14-20] 14 (11/28 1140) BP: (139-159)/(47-69) 159/61 (11/28 1140) SpO2:  [90 %-95 %] 95 % (11/28 1140) Weight:  [105.7 kg] 105.7 kg (11/28 0500)  Weight change:  Filed Weights   02/14/22 0404 02/15/22 0500 02/17/22 0500  Weight: 114.9 kg 108.1 kg 105.7 kg    Intake/Output:    Intake/Output Summary (Last 24 hours) at 02/17/2022 1319 Last data filed at 02/16/2022 1802 Gross per 24 hour  Intake 480 ml  Output 300 ml  Net 180 ml   Physical exam: General appearance: Sitting up, no acute distress HEENT: Moist oral mucous membranes Neck: Supple, no masses Pulmonary: Lungs are clear to auscultation, normal breathing effort Cardiovascular: Irregular rhythm, soft systolic murmur Abdomen: Distended, soft, nontender Extremities: Dependent pitting edema present Skin: Hyperkeratotic skin over both legs Neuro: Alert, able to answer simple questions    Basic Metabolic Panel:  Recent Labs  Lab 02/12/22 1456 02/13/22 0825 02/14/22 0603 02/15/22 1227 02/16/22 0457 02/17/22 0616  NA 137 141 140 139 137 137  K 4.0 4.1  3.6 3.9 3.4* 3.7  CL 95* 95* 94* 90* 89* 90*  CO2 28 33* 34* 34* 34* 36*  GLUCOSE 177* 121* 153* 281* 211* 207*  BUN 63* 70* 78* 82* 85* 92*  CREATININE 2.97* 3.06* 2.92* 2.92* 2.82* 2.72*  CALCIUM 9.0 9.1 9.0 9.1 8.9 8.8*  MG 2.3  --  2.5* 2.4 2.4 2.5*     CBC: Recent Labs  Lab 02/12/22 1456 02/14/22 0603 02/15/22 1227 02/16/22 0457 02/17/22 0616  WBC 9.1 6.5 6.4 5.9 6.2  HGB 10.9* 10.9* 11.6* 11.0* 10.9*  HCT 35.0* 34.3* 36.8* 34.5* 33.8*  MCV 92.1 92.0 92.2 90.8 91.1  PLT 156 178 183 175 179     No results found for: "HEPBSAG", "HEPBSAB", "HEPBIGM"    Microbiology:  No results found for this or any previous visit (from the past 240 hour(s)).  Coagulation Studies: No results for input(s): "LABPROT", "INR" in the last 72 hours.  Urinalysis: No results for input(s): "COLORURINE", "LABSPEC", "PHURINE", "GLUCOSEU", "HGBUR", "BILIRUBINUR", "KETONESUR", "PROTEINUR", "UROBILINOGEN", "NITRITE", "LEUKOCYTESUR" in the last 72 hours.  Invalid input(s): "APPERANCEUR"    Imaging: No results found.   Medications:    furosemide (LASIX) 200 mg in dextrose 5 % 100 mL (2 mg/mL) infusion 8 mg/hr (02/17/22 1015)    apixaban  2.5 mg Oral BID   ascorbic acid  500 mg Oral Daily   cholecalciferol  1,000 Units Oral Daily   dorzolamide-timolol  1 drop Left Eye BID   doxazosin  1 mg Oral BID  hydrALAZINE  100 mg Oral TID   insulin aspart  0-5 Units Subcutaneous QHS   insulin aspart  0-9 Units Subcutaneous TID WC   isosorbide mononitrate  30 mg Oral Daily   linagliptin  5 mg Oral Daily   multivitamin with minerals  1 tablet Oral Daily   omega-3 acid ethyl esters  1 g Oral Daily   potassium chloride  20 mEq Oral Once   rosuvastatin  5 mg Oral Once per day on Mon Wed Fri   acetaminophen **OR** acetaminophen, ipratropium-albuterol, magnesium hydroxide, ondansetron **OR** ondansetron (ZOFRAN) IV, traZODone  Assessment/ Plan:  81 y.o. 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  Lately creatinine has fluctuated between 2.4-3.06 Today's creatinine is down to 2.72 Urine output in 2 to 3 L range over the past 4 days. Currently requiring IV furosemide infusion for # acute on chronic diastolic CHF in the setting of # pulmonary hypertension and # coronary artery disease and # atrial fibrillation.  Plan: Furosemide drip at 4 mg/h when seen earlier today.  It has been increased to 12 mg/h as per advance CHF team.  Acetazolamide has also been added. Will assess response If creatinine is increasing tomorrow, would prefer that we slow down diuresis and cut back the dose of furosemide infusion to 4 to 6 mg/h. Serum creatinine is fluctuating due to aggressive diuresis and cardiorenal syndrome. Further recommendations as hospital course progresses.    LOS: [place] 11/28/20231:19 PM  [place], [place]  Note: This note was prepared with Dragon dictation. Any transcription errors are unintentional

## 2022-02-18 DIAGNOSIS — I5033 Acute on chronic diastolic (congestive) heart failure: Secondary | ICD-10-CM | POA: Diagnosis not present

## 2022-02-18 DIAGNOSIS — N179 Acute kidney failure, unspecified: Secondary | ICD-10-CM | POA: Diagnosis not present

## 2022-02-18 DIAGNOSIS — N184 Chronic kidney disease, stage 4 (severe): Secondary | ICD-10-CM | POA: Diagnosis not present

## 2022-02-18 LAB — BASIC METABOLIC PANEL
Anion gap: 12 (ref 5–15)
BUN: 96 mg/dL — ABNORMAL HIGH (ref 8–23)
CO2: 37 mmol/L — ABNORMAL HIGH (ref 22–32)
Calcium: 9.2 mg/dL (ref 8.9–10.3)
Chloride: 91 mmol/L — ABNORMAL LOW (ref 98–111)
Creatinine, Ser: 2.9 mg/dL — ABNORMAL HIGH (ref 0.61–1.24)
GFR, Estimated: 21 mL/min — ABNORMAL LOW (ref >60)
Glucose, Bld: 205 mg/dL — ABNORMAL HIGH (ref 70–99)
Potassium: 3.6 mmol/L (ref 3.5–5.1)
Sodium: 140 mmol/L (ref 135–145)

## 2022-02-18 LAB — CBC
HCT: 35 % — ABNORMAL LOW (ref 39.0–52.0)
Hemoglobin: 10.7 g/dL — ABNORMAL LOW (ref 13.0–17.0)
MCH: 28.1 pg (ref 26.0–34.0)
MCHC: 30.6 g/dL (ref 30.0–36.0)
MCV: 91.9 fL (ref 80.0–100.0)
Platelets: 169 10*3/uL (ref 150–400)
RBC: 3.81 MIL/uL — ABNORMAL LOW (ref 4.22–5.81)
RDW: 15.1 % (ref 11.5–15.5)
WBC: 6.7 10*3/uL (ref 4.0–10.5)
nRBC: 0 % (ref 0.0–0.2)

## 2022-02-18 LAB — GLUCOSE, CAPILLARY
Glucose-Capillary: 250 mg/dL — ABNORMAL HIGH (ref 70–99)
Glucose-Capillary: 185 mg/dL — ABNORMAL HIGH (ref 70–99)
Glucose-Capillary: 271 mg/dL — ABNORMAL HIGH (ref 70–99)
Glucose-Capillary: 237 mg/dL — ABNORMAL HIGH (ref 70–99)
Glucose-Capillary: 228 mg/dL — ABNORMAL HIGH (ref 70–99)

## 2022-02-18 LAB — MAGNESIUM: Magnesium: 2.5 mg/dL — ABNORMAL HIGH (ref 1.7–2.4)

## 2022-02-18 MED ORDER — ORAL CARE MOUTH RINSE: 15.0000 mL | OROMUCOSAL | Status: DC | PRN

## 2022-02-18 NOTE — Progress Notes (Signed)
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 (HCC) Active Problems:   Elevated troponin   Type 2 diabetes mellitus with stage 4 chronic kidney disease, with long-term current use of insulin (HCC)   Paroxysmal atrial fibrillation (HCC)   CKD (chronic kidney disease) stage 4, GFR 15-29 ml/min (HCC)   Depression   Essential hypertension   Morbid obesity (HCC)   Dyslipidemia   Coronary artery disease involving native coronary artery of native heart without angina pectoris   Acute on chronic heart failure with preserved ejection fraction (HFpEF) (HCC)   Permanent atrial fibrillation (HCC)  Assessment and Plan:  Acute on chronic diastolic CHF: continue on lasix drip. Monitor I/Os. Repeat echo w/ similar EF, significantly elevated right ventricular pressure and severe pulmonary hypertension.     Acute hypoxic respiratory failure: O2 sats down to 86% on room air while working with PT on 11/22. Continue on supplemental oxygen and wean as tolerated    Elevated troponin: likely secondary to demand ischemia    DM2: likely poorly controlled. Holding home dose of glipizide. Continue on SSI w/ accuchecks    CKDIV: Cr is labile. Will continue to monitor closely while on IV lasix drip. Nephro following and recs apprec    PAF: rate controlled. Continue on eliquis    Depression: severity unknown. Continue on home dose of zoloft    Morbid obesity: BMI 35.3. Complicates overall care & prognosis    HTN: continue on hydralazine, imdur, lasix, cardura    HLD: continue on statin    CAD: with remote PCI. Continue on statin, imdur       DVT prophylaxis: eliquis Code Status: full  Family Communication: Disposition Plan: pt is refusing SNF and HH as per OT  Level of care: Telemetry Cardiac  Status is: Inpatient Remains  inpatient appropriate because: severity of illness   Consultants:  Nephro Cardio   Procedures:   Antimicrobials:    Subjective: Pt c/o malaise   Objective: Vitals:   02/17/22 2350 02/18/22 0313 02/18/22 0323 02/18/22 0804  BP: (!) 148/56  (!) 145/53 (!) 165/55  Pulse: (!) 46  (!) 46 (!) 52  Resp: 17  17 12   Temp: 98.2 F (36.8 C)  (!) 97.3 F (36.3 C) (!) 97.4 F (36.3 C)  TempSrc:   Oral   SpO2: 97%  98% 94%  Weight:  105.3 kg    Height:        Intake/Output Summary (Last 24 hours) at 02/18/2022 0901 Last data filed at 02/18/2022 0400 Gross per 24 hour  Intake 293.99 ml  Output 1800 ml  Net -1506.01 ml   Filed Weights   02/15/22 0500 02/17/22 0500 02/18/22 0313  Weight: 108.1 kg 105.7 kg 105.3 kg    Examination:  General exam: Appears calm and comfortable  Respiratory system: Clear to auscultation. Respiratory effort normal. Cardiovascular system: S1 & S2 +. No rubs, gallops or clicks. >5+ pitting edema of b/l LE Gastrointestinal system: Abdomen is obese, soft and nontender. Normal bowel sounds heard. Central nervous system: Alert and awake. Moves all extremities Psychiatry: Judgement and insight appears poor. Flat mood and affect    Data Reviewed: I have personally reviewed following labs and imaging studies  CBC: Recent Labs  Lab 02/14/22 0603 02/15/22 1227 02/16/22 0457 02/17/22 0616 02/18/22 0700  WBC 6.5 6.4 5.9 6.2  6.7  HGB 10.9* 11.6* 11.0* 10.9* 10.7*  HCT 34.3* 36.8* 34.5* 33.8* 35.0*  MCV 92.0 92.2 90.8 91.1 91.9  PLT 178 183 175 179 335   Basic Metabolic Panel: Recent Labs  Lab 02/14/22 0603 02/15/22 1227 02/16/22 0457 02/17/22 0616 02/18/22 0700  NA 140 139 137 137 140  K 3.6 3.9 3.4* 3.7 3.6  CL 94* 90* 89* 90* 91*  CO2 34* 34* 34* 36* 37*  GLUCOSE 153* 281* 211* 207* 205*  BUN 78* 82* 85* 92* 96*  CREATININE 2.92* 2.92* 2.82* 2.72* 2.90*  CALCIUM 9.0 9.1 8.9 8.8* 9.2  MG 2.5* 2.4 2.4 2.5* 2.5*   GFR: Estimated  Creatinine Clearance: 22.7 mL/min (A) (by C-G formula based on SCr of 2.9 mg/dL (H)). Liver Function Tests: No results for input(s): "AST", "ALT", "ALKPHOS", "BILITOT", "PROT", "ALBUMIN" in the last 168 hours. No results for input(s): "LIPASE", "AMYLASE" in the last 168 hours. No results for input(s): "AMMONIA" in the last 168 hours. Coagulation Profile: No results for input(s): "INR", "PROTIME" in the last 168 hours. Cardiac Enzymes: No results for input(s): "CKTOTAL", "CKMB", "CKMBINDEX", "TROPONINI" in the last 168 hours. BNP (last 3 results) No results for input(s): "PROBNP" in the last 8760 hours. HbA1C: No results for input(s): "HGBA1C" in the last 72 hours. CBG: Recent Labs  Lab 02/17/22 1141 02/17/22 1648 02/17/22 2048 02/18/22 0233 02/18/22 0805  GLUCAP 278* 288* 352* 250* 185*   Lipid Profile: No results for input(s): "CHOL", "HDL", "LDLCALC", "TRIG", "CHOLHDL", "LDLDIRECT" in the last 72 hours. Thyroid Function Tests: No results for input(s): "TSH", "T4TOTAL", "FREET4", "T3FREE", "THYROIDAB" in the last 72 hours. Anemia Panel: No results for input(s): "VITAMINB12", "FOLATE", "FERRITIN", "TIBC", "IRON", "RETICCTPCT" in the last 72 hours. Sepsis Labs: No results for input(s): "PROCALCITON", "LATICACIDVEN" in the last 168 hours.  No results found for this or any previous visit (from the past 240 hour(s)).       Radiology Studies: No results found.      Scheduled Meds:  apixaban  2.5 mg Oral BID   ascorbic acid  500 mg Oral Daily   cholecalciferol  1,000 Units Oral Daily   dorzolamide-timolol  1 drop Left Eye BID   doxazosin  1 mg Oral BID   glipiZIDE  10 mg Oral Q breakfast   hydrALAZINE  100 mg Oral TID   insulin aspart  0-5 Units Subcutaneous QHS   insulin aspart  0-9 Units Subcutaneous TID WC   isosorbide mononitrate  30 mg Oral Daily   linagliptin  5 mg Oral Daily   multivitamin with minerals  1 tablet Oral Daily   omega-3 acid ethyl esters  1 g  Oral Daily   rosuvastatin  5 mg Oral Once per day on Mon Wed Fri   Continuous Infusions:  furosemide (LASIX) 200 mg in dextrose 5 % 100 mL (2 mg/mL) infusion 12 mg/hr (02/17/22 1550)     LOS: 9 days    Time spent: 25 mins     Wyvonnia Dusky, MD [facility] Pager [phone]  If 7PM-7AM, please contact night-coverage [email or web address] 02/18/2022, 9:01 AM

## 2022-02-18 NOTE — Progress Notes (Signed)
[place], [place] 02/18/22  Subjective:   Hospital day # 9 Patient known to our practice from previous outpatient follow-up.Last seen in June 2022 by Dr Holley Raring.  Patient seen sitting up in bed, eating breakfast. Alert and oriented Remains on room air Tolerating meals without nausea and vomiting Lower extremity edema remains.   Renal: 11/28 0701 - 11/29 0700 In: 294 [P.O.:240; I.V.:54] Out: 1800 [Urine:1800] Lab Results  Component Value Date   CREATININE 2.90 (H) 02/18/2022   CREATININE 2.72 (H) 02/17/2022   CREATININE 2.82 (H) 02/16/2022     Objective:  Vital signs in last 24 hours:  Temp:  [97.3 F (36.3 C)-98.2 F (36.8 C)] 97.5 F (36.4 C) (11/29 1151) Pulse Rate:  [45-58] 58 (11/29 1151) Resp:  [12-18] 18 (11/29 1151) BP: (131-165)/(53-65) 136/53 (11/29 1151) SpO2:  [94 %-100 %] 97 % (11/29 1151) Weight:  [105.3 kg] 105.3 kg (11/29 0313)  Weight change: -0.4 kg Filed Weights   02/15/22 0500 02/17/22 0500 02/18/22 0313  Weight: 108.1 kg 105.7 kg 105.3 kg    Intake/Output:    Intake/Output Summary (Last 24 hours) at 02/18/2022 1410 Last data filed at 02/18/2022 1044 Gross per 24 hour  Intake 293.99 ml  Output 2700 ml  Net -2406.01 ml    Physical exam: General appearance: Sitting up, no acute distress HEENT: Moist oral mucous membranes Neck: Supple, no masses Pulmonary: Lungs are clear to auscultation, normal breathing effort Cardiovascular: Irregular rhythm, soft systolic murmur Abdomen: Distended, soft, nontender Extremities: Dependent pitting edema present Skin: Hyperkeratotic skin over both legs Neuro: Alert, able to answer simple questions    Basic Metabolic Panel:  Recent Labs  Lab 02/14/22 0603 02/15/22 1227 02/16/22 0457 02/17/22 0616 02/18/22 0700  NA 140 139 137 137 140  K 3.6 3.9 3.4* 3.7 3.6  CL 94* 90* 89* 90* 91*  CO2 34* 34* 34* 36* 37*  GLUCOSE 153* 281* 211* 207* 205*  BUN 78* 82* 85*  92* 96*  CREATININE 2.92* 2.92* 2.82* 2.72* 2.90*  CALCIUM 9.0 9.1 8.9 8.8* 9.2  MG 2.5* 2.4 2.4 2.5* 2.5*      CBC: Recent Labs  Lab 02/14/22 0603 02/15/22 1227 02/16/22 0457 02/17/22 0616 02/18/22 0700  WBC 6.5 6.4 5.9 6.2 6.7  HGB 10.9* 11.6* 11.0* 10.9* 10.7*  HCT 34.3* 36.8* 34.5* 33.8* 35.0*  MCV 92.0 92.2 90.8 91.1 91.9  PLT 178 183 175 179 169      No results found for: "HEPBSAG", "HEPBSAB", "HEPBIGM"    Microbiology:  No results found for this or any previous visit (from the past 240 hour(s)).  Coagulation Studies: No results for input(s): "LABPROT", "INR" in the last 72 hours.  Urinalysis: No results for input(s): "COLORURINE", "LABSPEC", "PHURINE", "GLUCOSEU", "HGBUR", "BILIRUBINUR", "KETONESUR", "PROTEINUR", "UROBILINOGEN", "NITRITE", "LEUKOCYTESUR" in the last 72 hours.  Invalid input(s): "APPERANCEUR"    Imaging: No results found.   Medications:    furosemide (LASIX) 200 mg in dextrose 5 % 100 mL (2 mg/mL) infusion 12 mg/hr (02/18/22 1341)    apixaban  2.5 mg Oral BID   ascorbic acid  500 mg Oral Daily   cholecalciferol  1,000 Units Oral Daily   dorzolamide-timolol  1 drop Left Eye BID   doxazosin  1 mg Oral BID   hydrALAZINE  100 mg Oral TID   insulin aspart  0-5 Units Subcutaneous QHS   insulin aspart  0-9 Units Subcutaneous TID WC   isosorbide mononitrate  30 mg Oral Daily   linagliptin  5 mg Oral Daily   multivitamin with minerals  1 tablet Oral Daily   omega-3 acid ethyl esters  1 g Oral Daily   rosuvastatin  5 mg Oral Once per day on Mon Wed Fri   acetaminophen **OR** acetaminophen, ipratropium-albuterol, magnesium hydroxide, ondansetron **OR** ondansetron (ZOFRAN) IV, mouth rinse, traZODone  Assessment/ Plan:  81 y.o. male with coronary artery disease, remote history of PCI, type 2 diabetes, GERD, hypertension, dyslipidemia, chronic diastolic CHF, pulmonary hypertension chronic kidney disease, permanent A-fib, history of traumatic  subdural hematoma status postcraniotomy admitted on 02/09/2022 for Acute on chronic diastolic (congestive) heart failure (HCC) [O12.24] Systolic congestive heart failure, unspecified HF chronicity ([place]) [I50.20]  #Acute kidney injury on chronic kidney disease stage IV Baseline creatinine appears to be 2.4/GFR 26 from 02/10/2022.  Lately creatinine has fluctuated between 2.4-3.06 Today's creatinine is down to 2.72 Urine output in 2 to 3 L range over the past 4 days. Currently requiring IV furosemide infusion for # acute on chronic diastolic CHF in the setting of # pulmonary hypertension and # coronary artery disease and # atrial fibrillation.  Plan: Creatinine slightly increased to 2.9 today.  Will maintain current furosemide drip at 6 mg/h.  Adequate urine output recorded at 1.8 L in preceding 24 hours.  We will continue to monitor renal function as it will fluctuate with aggressive diuresis.    LOS: [place] 11/29/20232:10 PM  [place], [place]  Note: This note was prepared with Dragon dictation. Any transcription errors are unintentional

## 2022-02-19 ENCOUNTER — Encounter: Payer: Medicare HMO | Admitting: Internal Medicine

## 2022-02-19 DIAGNOSIS — Z992 Dependence on renal dialysis: Secondary | ICD-10-CM

## 2022-02-19 DIAGNOSIS — N170 Acute kidney failure with tubular necrosis: Secondary | ICD-10-CM | POA: Diagnosis not present

## 2022-02-19 DIAGNOSIS — I272 Pulmonary hypertension, unspecified: Secondary | ICD-10-CM

## 2022-02-19 DIAGNOSIS — I5033 Acute on chronic diastolic (congestive) heart failure: Secondary | ICD-10-CM | POA: Diagnosis not present

## 2022-02-19 DIAGNOSIS — I4821 Permanent atrial fibrillation: Secondary | ICD-10-CM | POA: Diagnosis not present

## 2022-02-19 DIAGNOSIS — N184 Chronic kidney disease, stage 4 (severe): Secondary | ICD-10-CM | POA: Diagnosis not present

## 2022-02-19 DIAGNOSIS — N179 Acute kidney failure, unspecified: Secondary | ICD-10-CM | POA: Diagnosis not present

## 2022-02-19 LAB — BASIC METABOLIC PANEL
Anion gap: 10 (ref 5–15)
BUN: 97 mg/dL — ABNORMAL HIGH (ref 8–23)
CO2: 37 mmol/L — ABNORMAL HIGH (ref 22–32)
Calcium: 9.1 mg/dL (ref 8.9–10.3)
Chloride: 94 mmol/L — ABNORMAL LOW (ref 98–111)
Creatinine, Ser: 2.95 mg/dL — ABNORMAL HIGH (ref 0.61–1.24)
GFR, Estimated: 20 mL/min — ABNORMAL LOW (ref >60)
Glucose, Bld: 110 mg/dL — ABNORMAL HIGH (ref 70–99)
Potassium: 3.3 mmol/L — ABNORMAL LOW (ref 3.5–5.1)
Sodium: 141 mmol/L (ref 135–145)

## 2022-02-19 LAB — CBC
HCT: 35 % — ABNORMAL LOW (ref 39.0–52.0)
Hemoglobin: 10.9 g/dL — ABNORMAL LOW (ref 13.0–17.0)
MCH: 28.5 pg (ref 26.0–34.0)
MCHC: 31.1 g/dL (ref 30.0–36.0)
MCV: 91.6 fL (ref 80.0–100.0)
Platelets: 183 10*3/uL (ref 150–400)
RBC: 3.82 MIL/uL — ABNORMAL LOW (ref 4.22–5.81)
RDW: 15 % (ref 11.5–15.5)
WBC: 8.3 10*3/uL (ref 4.0–10.5)
nRBC: 0 % (ref 0.0–0.2)

## 2022-02-19 LAB — GLUCOSE, CAPILLARY
Glucose-Capillary: 88 mg/dL (ref 70–99)
Glucose-Capillary: 204 mg/dL — ABNORMAL HIGH (ref 70–99)
Glucose-Capillary: 243 mg/dL — ABNORMAL HIGH (ref 70–99)
Glucose-Capillary: 302 mg/dL — ABNORMAL HIGH (ref 70–99)

## 2022-02-19 LAB — MAGNESIUM: Magnesium: 2.5 mg/dL — ABNORMAL HIGH (ref 1.7–2.4)

## 2022-02-19 MED ORDER — POTASSIUM CHLORIDE CRYS ER 20 MEQ PO TBCR
40.0000 meq | EXTENDED_RELEASE_TABLET | Freq: Once | ORAL | Status: AC
  Administered 2022-02-19: 40 meq via ORAL
  Filled 2022-02-19: qty 2

## 2022-02-19 NOTE — Progress Notes (Signed)
PT Cancellation Note  Patient Details Name: Kyle Roberson MRN: [id number] DOB: Apr 12, 1940   Cancelled Treatment:    Reason Eval/Treat Not Completed: Patient declined, no reason specified (Pt asks author to 'get the hell out' a few times.Author explained the role of PT in this setting.)Chief Strategy Officer and pt find common ground on the importance of remaining independent with basic self care and mobility and how requiring assistance with these things can negatively impact QOL. Pt assures his preference against PT has nothing to do with feeling painful or tired today- he reduces it to a simple desire to do nothing right now. Pt acknowledges he does feel somewhat off baseline, but believes he'll be fine to resume mobility at DC. Author expresses concern that he is at risk of losing strength, balance, mobility with prolonged inactivity here. Pt offers no meaningful functional activities that interest him at this moment as a them of intervention. Pt declines PT and wishes Kyle Roberson a nice day.   11:17 AM, 02/19/22 Etta Grandchild, PT, DPT Physical Therapist - [facility]  [phone] ([place])     Kyle Roberson 02/19/2022, 11:14 AM

## 2022-02-19 NOTE — Progress Notes (Signed)
[place], [place] 02/19/22  Subjective:   Hospital day # 10 Patient known to our practice from previous outpatient follow-up.Last seen in June 2022 by Dr Holley Raring.  Patient seen resting quietly in bed, alert and oriented Remains on room air Anxious for discharge Lower extremity edema remains   Renal: 11/29 0701 - 11/30 0700 In: 261.9 [P.O.:120; I.V.:141.9] Out: 3400 [Urine:3400] Lab Results  Component Value Date   CREATININE 2.95 (H) 02/19/2022   CREATININE 2.90 (H) 02/18/2022   CREATININE 2.72 (H) 02/17/2022   Furosemide drip 12 mg/h  Objective:  Vital signs in last 24 hours:  Temp:  [97.4 F (36.3 C)-98.6 F (37 C)] 97.4 F (36.3 C) (11/30 0804) Pulse Rate:  [41-75] 51 (11/30 0804) Resp:  [16-18] 16 (11/30 0804) BP: (128-153)/(46-66) 138/49 (11/30 0804) SpO2:  [91 %-99 %] 91 % (11/30 0804)  Weight change:  Filed Weights   02/15/22 0500 02/17/22 0500 02/18/22 0313  Weight: 108.1 kg 105.7 kg 105.3 kg    Intake/Output:    Intake/Output Summary (Last 24 hours) at 02/19/2022 1312 Last data filed at 02/19/2022 1100 Gross per 24 hour  Intake 645.59 ml  Output 2500 ml  Net -1854.41 ml    Physical exam: General appearance: Sitting up, no acute distress HEENT: Moist oral mucous membranes Neck: Supple, no masses Pulmonary: Lungs are clear to auscultation, normal breathing effort Cardiovascular: Irregular rhythm, soft systolic murmur Abdomen: Distended, soft, nontender Extremities: Dependent pitting edema present Skin: Hyperkeratotic skin over both legs Neuro: Alert, able to answer simple questions    Basic Metabolic Panel:  Recent Labs  Lab 02/15/22 1227 02/16/22 0457 02/17/22 0616 02/18/22 0700 02/19/22 0614  NA 139 137 137 140 141  K 3.9 3.4* 3.7 3.6 3.3*  CL 90* 89* 90* 91* 94*  CO2 34* 34* 36* 37* 37*  GLUCOSE 281* 211* 207* 205* 110*  BUN 82* 85* 92* 96* 97*  CREATININE 2.92* 2.82* 2.72* 2.90* 2.95*  CALCIUM  9.1 8.9 8.8* 9.2 9.1  MG 2.4 2.4 2.5* 2.5* 2.5*      CBC: Recent Labs  Lab 02/15/22 1227 02/16/22 0457 02/17/22 0616 02/18/22 0700 02/19/22 0614  WBC 6.4 5.9 6.2 6.7 8.3  HGB 11.6* 11.0* 10.9* 10.7* 10.9*  HCT 36.8* 34.5* 33.8* 35.0* 35.0*  MCV 92.2 90.8 91.1 91.9 91.6  PLT 183 175 179 169 183      No results found for: "HEPBSAG", "HEPBSAB", "HEPBIGM"    Microbiology:  No results found for this or any previous visit (from the past 240 hour(s)).  Coagulation Studies: No results for input(s): "LABPROT", "INR" in the last 72 hours.  Urinalysis: No results for input(s): "COLORURINE", "LABSPEC", "PHURINE", "GLUCOSEU", "HGBUR", "BILIRUBINUR", "KETONESUR", "PROTEINUR", "UROBILINOGEN", "NITRITE", "LEUKOCYTESUR" in the last 72 hours.  Invalid input(s): "APPERANCEUR"    Imaging: No results found.   Medications:    furosemide (LASIX) 200 mg in dextrose 5 % 100 mL (2 mg/mL) infusion 12 mg/hr (02/19/22 0700)    apixaban  2.5 mg Oral BID   ascorbic acid  500 mg Oral Daily   cholecalciferol  1,000 Units Oral Daily   dorzolamide-timolol  1 drop Left Eye BID   doxazosin  1 mg Oral BID   hydrALAZINE  100 mg Oral TID   insulin aspart  0-5 Units Subcutaneous QHS   insulin aspart  0-9 Units Subcutaneous TID WC   isosorbide mononitrate  30 mg Oral Daily   linagliptin  5 mg Oral Daily   multivitamin with minerals  1 tablet Oral Daily   omega-3 acid ethyl esters  1 g Oral Daily   rosuvastatin  5 mg Oral Once per day on Mon Wed Fri   acetaminophen **OR** acetaminophen, ipratropium-albuterol, magnesium hydroxide, ondansetron **OR** ondansetron (ZOFRAN) IV, mouth rinse, traZODone  Assessment/ Plan:  81 y.o. male with coronary artery disease, remote history of PCI, type 2 diabetes, GERD, hypertension, dyslipidemia, chronic diastolic CHF, pulmonary hypertension chronic kidney disease, permanent A-fib, history of traumatic subdural hematoma status postcraniotomy admitted on  02/09/2022 for Acute on chronic diastolic (congestive) heart failure (HCC) [P54.88] Systolic congestive heart failure, unspecified HF chronicity ([place]) [I50.20]  #Acute kidney injury on chronic kidney disease stage IV Baseline creatinine appears to be 2.4/GFR 26 from 02/10/2022.  Lately creatinine has fluctuated between 2.4-3.06 Currently requiring IV furosemide infusion for # acute on chronic diastolic CHF in the setting of # pulmonary hypertension and # coronary artery disease and # atrial fibrillation.  Plan: Creatinine remained stable however lower extremity edema remains.  Continue current furosemide drip at 12 mg/h.  No acute need for dialysis at this time.  Adequate urine output noted on current drip, 3.4 L in preceding 24 hours.    LOS: [place] 11/30/20231:12 PM  [place] Al[place], [place]

## 2022-02-19 NOTE — Plan of Care (Signed)
  Problem: Education: Goal: Ability to demonstrate management of disease process will improve Outcome: Progressing Goal: Ability to verbalize understanding of medication therapies will improve Outcome: Progressing   Problem: Activity: Goal: Capacity to carry out activities will improve Outcome: Progressing   

## 2022-02-19 NOTE — Progress Notes (Signed)
Rounding Note    Patient Name: Kyle Roberson Date of Encounter: 02/19/2022  [place] Cardiologist: Ida Rogue, MD   Subjective   Reports doing okay today His bottom hurts from sitting in recliner for long stretches of time Feels his breathing is heading in the right direction Remains on Lasix infusion at 12 -3.4 L, -19 L total Continues to appreciate leg swelling, mild abdominal distention  Inpatient Medications    Scheduled Meds:  apixaban  2.5 mg Oral BID   ascorbic acid  500 mg Oral Daily   cholecalciferol  1,000 Units Oral Daily   dorzolamide-timolol  1 drop Left Eye BID   doxazosin  1 mg Oral BID   hydrALAZINE  100 mg Oral TID   insulin aspart  0-5 Units Subcutaneous QHS   insulin aspart  0-9 Units Subcutaneous TID WC   isosorbide mononitrate  30 mg Oral Daily   linagliptin  5 mg Oral Daily   multivitamin with minerals  1 tablet Oral Daily   omega-3 acid ethyl esters  1 g Oral Daily   rosuvastatin  5 mg Oral Once per day on Mon Wed Fri   Continuous Infusions:  furosemide (LASIX) 200 mg in dextrose 5 % 100 mL (2 mg/mL) infusion 12 mg/hr (02/19/22 0700)   PRN Meds: acetaminophen **OR** acetaminophen, ipratropium-albuterol, magnesium hydroxide, ondansetron **OR** ondansetron (ZOFRAN) IV, mouth rinse, traZODone   Vital Signs    Vitals:   02/18/22 2313 02/19/22 0009 02/19/22 0312 02/19/22 0804  BP: (!) 140/57 (!) 147/57 (!) 153/66 (!) 138/49  Pulse: 75 (!) 41  (!) 51  Resp: 18  16 16   Temp: 98.6 F (37 C)  97.8 F (36.6 C) (!) 97.4 F (36.3 C)  TempSrc: Oral  Oral   SpO2: 98% 97% 99% 91%  Weight:      Height:        Intake/Output Summary (Last 24 hours) at 02/19/2022 1159 Last data filed at 02/19/2022 1100 Gross per 24 hour  Intake 645.59 ml  Output 2500 ml  Net -1854.41 ml      02/18/2022    3:13 AM 02/17/2022    5:00 AM 02/15/2022    5:00 AM  Last 3 Weights  Weight (lbs) 232 lb 2.3 oz 233 lb 0.4 oz 238 lb 5.1 oz   Weight (kg) 105.3 kg 105.7 kg 108.1 kg      Telemetry  Atrial fibrillation- Personally Reviewed  ECG     - Personally Reviewed  Physical Exam   GEN: No acute distress.   Neck: Unable to estimate JVD Cardiac: Irregularly irregular no murmurs, rubs, or gallops.  Respiratory: Clear to auscultation bilaterally.,  Rales at the bases GI: Soft, nontender, non-distended  MS: No edema; No deformity. Neuro:  Nonfocal  Psych: Normal affect   Labs    High Sensitivity Troponin:   Recent Labs  Lab 02/08/22 1919 02/08/22 2210  TROPONINIHS 22* 21*     Chemistry Recent Labs  Lab 02/17/22 0616 02/18/22 0700 02/19/22 0614  NA 137 140 141  K 3.7 3.6 3.3*  CL 90* 91* 94*  CO2 36* 37* 37*  GLUCOSE 207* 205* 110*  BUN 92* 96* 97*  CREATININE 2.72* 2.90* 2.95*  CALCIUM 8.8* 9.2 9.1  MG 2.5* 2.5* 2.5*  GFRNONAA 22* 21* 20*  ANIONGAP 11 12 10     Lipids No results for input(s): "CHOL", "TRIG", "HDL", "LABVLDL", "LDLCALC", "CHOLHDL" in the last 168 hours.  Hematology Recent Labs  Lab 02/17/22 0616 02/18/22 0700  02/19/22 0614  WBC 6.2 6.7 8.3  RBC 3.71* 3.81* 3.82*  HGB 10.9* 10.7* 10.9*  HCT 33.8* 35.0* 35.0*  MCV 91.1 91.9 91.6  MCH 29.4 28.1 28.5  MCHC 32.2 30.6 31.1  RDW 14.7 15.1 15.0  PLT 179 169 183   Thyroid No results for input(s): "TSH", "FREET4" in the last 168 hours.  BNPNo results for input(s): "BNP", "PROBNP" in the last 168 hours.  DDimer No results for input(s): "DDIMER" in the last 168 hours.   Radiology    No results found.  Cardiac Studies     Patient Profile     81 y.o. male with history of chronic diastolic CHF, CAD s/p PCI to LAD and diagonal, stage IV CKD, permanent AF, obesity, DM II, hx traumatic subdural hematoma s/p craniotomy. Admitted with massive volume overload after failing outpatient diuretics.   Assessment & Plan   Acute on chronic diastolic CHF, right side heart failure Recent echocardiogram normal ejection fraction, septal  flattening consistent with RV pressure overload, RVSP severely elevated 83 mmHg with moderate TR and dilated pulmonary artery, IVC Failed outpatient therapy, declined hospitalization on most recent office visit Presenting to the hospital with worsening symptoms of heart failure -Through his long hospital course he has responded to Lasix infusion, -20 L today Seen by advanced heart failure clinic December 28, Lasix infusion increased from 8 back to 12 with doses of Diamox It appears he has responded well to the higher Lasix infusion -1 L clear urine in his urine collection bucket, still with abdominal distention, leg swelling  -Would recommend we continue the Lasix infusion, relatively stable renal function though elevated On hydralazine 100 3 times daily, isosorbide 30, Cardura 1 twice daily  Pulmonary hypertension Severely elevated right heart pressures on echocardiogram Continue aggressive diuresis as above Could consider right heart catheterization to help guide diuresis  Permanent atrial fibrillation Rate in the 50s, not on beta-blocker Maintained on Eliquis 2.5 twice daily  Acute on chronic renal failure stage IV Creatinine relatively stable at 2.9 up to 3 Nephrology following, likely component of cardiorenal syndrome in the setting of underlying renal pathology  Essential hypertension Plan is to continue Cardura, hydralazine, Imdur, continue Lasix infusion   Total encounter time more than 50 minutes  Greater than 50% was spent in counseling and coordination of care with the patient   For questions or updates, please contact [place] Please consult [email or web address] for contact info under        Signed, Ida Rogue, MD  02/19/2022, 11:59 AM

## 2022-02-19 NOTE — Inpatient Diabetes Management (Signed)
Inpatient Diabetes Program Recommendations  AACE/ADA: New Consensus Statement on Inpatient Glycemic Control   Target Ranges:  Prepandial:   less than 140 mg/dL      Peak postprandial:   less than 180 mg/dL (1-2 hours)      Critically ill patients:  140 - 180 mg/dL    Latest Reference Range & Units 02/19/22 06:14  Glucose 70 - 99 mg/dL 110 (H)    Latest Reference Range & Units 02/18/22 08:05 02/18/22 11:54 02/18/22 16:12 02/18/22 21:09  Glucose-Capillary 70 - 99 mg/dL 185 (H) 271 (H) 237 (H) 228 (H)   Review of Glycemic Control  Diabetes history: DM2 Outpatient Diabetes medications: Glipizide XL 10 mg QAM, NPH 10-15 units after breakfast and before bedtime (not taking) Current orders for Inpatient glycemic control: Novolog 0-9 units TID with meals, Novolog 0-5 units QHS, Tradjenta 5 mg daily  Inpatient Diabetes Program Recommendations:    Insulin: Per MAR, patient is refusing insulin correction (only give Novolog once on 02/18/22). Please encourage patient to take insulin when parameters are met.  Post prandial glucose is consistently elevated and patient would benefit from meal coverage; If patient willing to take insulin consider ordering Novolog 3 units TID with meals for meal coverage if patient eats at least 50% of meals.  Oral DM medications: Noted Glipizide was ordered on 11/29 and no longer ordered; patient received Glipizide 10 mg on 02/18/22 at 9:31 am.  Thanks, Barnie Alderman, RN, MSN, [place] Diabetes Coordinator Inpatient Diabetes Program [phone] (Team Pager from 8am to [place])

## 2022-02-19 NOTE — Progress Notes (Signed)
PROGRESS NOTE    Kyle Roberson  ZOX:[id number] DOB: [birth year] DOA: 02/09/2022 PCP: Glean Hess, MD    Assessment & Plan:   Principal Problem:   Acute on chronic diastolic (congestive) heart failure (HCC) Active Problems:   Elevated troponin   Type 2 diabetes mellitus with stage 4 chronic kidney disease, with long-term current use of insulin (HCC)   Paroxysmal atrial fibrillation (HCC)   CKD (chronic kidney disease) stage 4, GFR 15-29 ml/min (HCC)   Depression   Essential hypertension   Morbid obesity (HCC)   Dyslipidemia   Coronary artery disease involving native coronary artery of native heart without angina pectoris   Acute on chronic heart failure with preserved ejection fraction (HFpEF) (HCC)   Permanent atrial fibrillation (HCC)  Assessment and Plan:  Acute on chronic diastolic CHF: continue on IV lasix drip. Monitor I/Os. Repeat echo w/ similar EF, significantly elevated right ventricular pressure and severe pulmonary hypertension. Cardio & nephro following and recs apprec     Acute hypoxic respiratory failure: O2 sats down to 86% on room air while working with PT on 11/22.  Continue on supplemental oxygen and wean as tolerated    Elevated troponin: likely secondary to demand ischemia    DM2: likely poorly controlled. Continue on SSI w/ accuchecks   CKDIV: Cr is trending up today. Continue to monitor closely while on lasix drip.  Nephro following and recs apprec    PAF: rate controlled. Continue on eliquis    Depression: severity unknown. Continue on home dose of zoloft    Morbid obesity: BMI 35.3. Complicates overall care & prognosis    HTN: continue on cardura, lasix, imdur, hydralazine    HLD: continue on statin    CAD: with remote PCI. Continue on statin, imdur       DVT prophylaxis: eliquis Code Status: full  Family Communication: Disposition Plan: pt is refusing SNF and HH as per OT  Level of care: Telemetry Cardiac  Status is:  Inpatient Remains inpatient appropriate because: severity of illness   Consultants:  Nephro Cardio   Procedures:   Antimicrobials:    Subjective: Pt c/o being "stuck in the hospital."  Objective: Vitals:   02/18/22 2001 02/18/22 2313 02/19/22 0009 02/19/22 0312  BP: (!) 143/56 (!) 140/57 (!) 147/57 (!) 153/66  Pulse: (!) 50 75 (!) 41   Resp: 18 18  16   Temp: 97.6 F (36.4 C) 98.6 F (37 C)  97.8 F (36.6 C)  TempSrc: Oral Oral  Oral  SpO2: 98% 98% 97% 99%  Weight:      Height:        Intake/Output Summary (Last 24 hours) at 02/19/2022 0805 Last data filed at 02/19/2022 0600 Gross per 24 hour  Intake 255.91 ml  Output 3400 ml  Net -3144.09 ml   Filed Weights   02/15/22 0500 02/17/22 0500 02/18/22 0313  Weight: 108.1 kg 105.7 kg 105.3 kg    Examination:  General exam: Appears frustrated  Respiratory system: decreased breath sounds b/l  Cardiovascular system: S1/S2+. No rubs or clicks. ? 3+ pitting edema of b/l LE  Gastrointestinal system: Abd is soft, NT, obese & normal  bowel sounds  Central nervous system: Alert and awake. Moves all extremities Psychiatry: judgement and insight appears poor. Frustrated mood and affect     Data Reviewed: I have personally reviewed following labs and imaging studies  CBC: Recent Labs  Lab 02/15/22 1227 02/16/22 0457 02/17/22 0616 02/18/22 0700 02/19/22 0614  WBC 6.4 5.9 6.2  6.7 8.3  HGB 11.6* 11.0* 10.9* 10.7* 10.9*  HCT 36.8* 34.5* 33.8* 35.0* 35.0*  MCV 92.2 90.8 91.1 91.9 91.6  PLT 183 175 179 169 937   Basic Metabolic Panel: Recent Labs  Lab 02/15/22 1227 02/16/22 0457 02/17/22 0616 02/18/22 0700 02/19/22 0614  NA 139 137 137 140 141  K 3.9 3.4* 3.7 3.6 3.3*  CL 90* 89* 90* 91* 94*  CO2 34* 34* 36* 37* 37*  GLUCOSE 281* 211* 207* 205* 110*  BUN 82* 85* 92* 96* 97*  CREATININE 2.92* 2.82* 2.72* 2.90* 2.95*  CALCIUM 9.1 8.9 8.8* 9.2 9.1  MG 2.4 2.4 2.5* 2.5* 2.5*   GFR: Estimated Creatinine  Clearance: 22.3 mL/min (A) (by C-G formula based on SCr of 2.95 mg/dL (H)). Liver Function Tests: No results for input(s): "AST", "ALT", "ALKPHOS", "BILITOT", "PROT", "ALBUMIN" in the last 168 hours. No results for input(s): "LIPASE", "AMYLASE" in the last 168 hours. No results for input(s): "AMMONIA" in the last 168 hours. Coagulation Profile: No results for input(s): "INR", "PROTIME" in the last 168 hours. Cardiac Enzymes: No results for input(s): "CKTOTAL", "CKMB", "CKMBINDEX", "TROPONINI" in the last 168 hours. BNP (last 3 results) No results for input(s): "PROBNP" in the last 8760 hours. HbA1C: No results for input(s): "HGBA1C" in the last 72 hours. CBG: Recent Labs  Lab 02/18/22 0233 02/18/22 0805 02/18/22 1154 02/18/22 1612 02/18/22 2109  GLUCAP 250* 185* 271* 237* 228*   Lipid Profile: No results for input(s): "CHOL", "HDL", "LDLCALC", "TRIG", "CHOLHDL", "LDLDIRECT" in the last 72 hours. Thyroid Function Tests: No results for input(s): "TSH", "T4TOTAL", "FREET4", "T3FREE", "THYROIDAB" in the last 72 hours. Anemia Panel: No results for input(s): "VITAMINB12", "FOLATE", "FERRITIN", "TIBC", "IRON", "RETICCTPCT" in the last 72 hours. Sepsis Labs: No results for input(s): "PROCALCITON", "LATICACIDVEN" in the last 168 hours.  No results found for this or any previous visit (from the past 240 hour(s)).       Radiology Studies: No results found.      Scheduled Meds:  apixaban  2.5 mg Oral BID   ascorbic acid  500 mg Oral Daily   cholecalciferol  1,000 Units Oral Daily   dorzolamide-timolol  1 drop Left Eye BID   doxazosin  1 mg Oral BID   hydrALAZINE  100 mg Oral TID   insulin aspart  0-5 Units Subcutaneous QHS   insulin aspart  0-9 Units Subcutaneous TID WC   isosorbide mononitrate  30 mg Oral Daily   linagliptin  5 mg Oral Daily   multivitamin with minerals  1 tablet Oral Daily   omega-3 acid ethyl esters  1 g Oral Daily   rosuvastatin  5 mg Oral Once per  day on Mon Wed Fri   Continuous Infusions:  furosemide (LASIX) 200 mg in dextrose 5 % 100 mL (2 mg/mL) infusion 12 mg/hr (02/18/22 1341)     LOS: 10 days    Time spent: 25 mins     Wyvonnia Dusky, MD [facility] Pager [phone]  If 7PM-7AM, please contact night-coverage [email or web address] 02/19/2022, 8:05 AM

## 2022-02-20 DIAGNOSIS — I5033 Acute on chronic diastolic (congestive) heart failure: Secondary | ICD-10-CM | POA: Diagnosis not present

## 2022-02-20 DIAGNOSIS — N179 Acute kidney failure, unspecified: Secondary | ICD-10-CM | POA: Diagnosis not present

## 2022-02-20 DIAGNOSIS — N184 Chronic kidney disease, stage 4 (severe): Secondary | ICD-10-CM | POA: Diagnosis not present

## 2022-02-20 LAB — GLUCOSE, CAPILLARY
Glucose-Capillary: 154 mg/dL — ABNORMAL HIGH (ref 70–99)
Glucose-Capillary: 203 mg/dL — ABNORMAL HIGH (ref 70–99)
Glucose-Capillary: 258 mg/dL — ABNORMAL HIGH (ref 70–99)
Glucose-Capillary: 269 mg/dL — ABNORMAL HIGH (ref 70–99)

## 2022-02-20 LAB — BASIC METABOLIC PANEL
Anion gap: 12 (ref 5–15)
BUN: 95 mg/dL — ABNORMAL HIGH (ref 8–23)
CO2: 34 mmol/L — ABNORMAL HIGH (ref 22–32)
Calcium: 9.1 mg/dL (ref 8.9–10.3)
Chloride: 93 mmol/L — ABNORMAL LOW (ref 98–111)
Creatinine, Ser: 2.97 mg/dL — ABNORMAL HIGH (ref 0.61–1.24)
GFR, Estimated: 20 mL/min — ABNORMAL LOW (ref >60)
Glucose, Bld: 194 mg/dL — ABNORMAL HIGH (ref 70–99)
Potassium: 3.7 mmol/L (ref 3.5–5.1)
Sodium: 139 mmol/L (ref 135–145)

## 2022-02-20 LAB — CBC
HCT: 33.2 % — ABNORMAL LOW (ref 39.0–52.0)
Hemoglobin: 10.7 g/dL — ABNORMAL LOW (ref 13.0–17.0)
MCH: 29.2 pg (ref 26.0–34.0)
MCHC: 32.2 g/dL (ref 30.0–36.0)
MCV: 90.7 fL (ref 80.0–100.0)
Platelets: 176 10*3/uL (ref 150–400)
RBC: 3.66 MIL/uL — ABNORMAL LOW (ref 4.22–5.81)
RDW: 14.9 % (ref 11.5–15.5)
WBC: 7.7 10*3/uL (ref 4.0–10.5)
nRBC: 0 % (ref 0.0–0.2)

## 2022-02-20 MED ORDER — FUROSEMIDE 40 MG PO TABS
80.0000 mg | ORAL_TABLET | Freq: Two times a day (BID) | ORAL | Status: DC
  Administered 2022-02-21 (×2): 80 mg via ORAL
  Filled 2022-02-20 (×2): qty 2

## 2022-02-20 MED ORDER — TORSEMIDE 20 MG PO TABS: 40.0000 mg | ORAL_TABLET | Freq: Two times a day (BID) | ORAL | Status: DC

## 2022-02-20 NOTE — Progress Notes (Signed)
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) Active Problems:   Elevated troponin   Type 2 diabetes mellitus with stage 4 chronic kidney disease, with long-term current use of insulin (HCC)   Paroxysmal atrial fibrillation (HCC)   CKD (chronic kidney disease) stage 4, GFR 15-29 ml/min (HCC)   Depression   Essential hypertension   Morbid obesity (HCC)   Dyslipidemia   Coronary artery disease involving native coronary artery of native heart without angina pectoris   Acute on chronic heart failure with preserved ejection fraction (HFpEF) (HCC)   Permanent atrial fibrillation ([place])   Pulmonary hypertension, unspecified (HCC)   Acute renal failure with acute tubular necrosis superimposed on stage 4 chronic kidney disease (HCC)  Assessment and Plan:  Acute on chronic diastolic CHF: d/c IV lasix drip and start po lasix as per cardio. Monitor I/Os. Repeat echo w/ similar EF, significantly elevated right ventricular pressure and severe pulmonary hypertension. Cardio & nephro following and recs apprec     Acute hypoxic respiratory failure: O2 sats down to 86% on room air while working with PT on 11/22. Weaned off of supplemental oxygen. Will do an oxygen desaturation test tomorrow    Elevated troponin: likely secondary to demand ischemia    DM2: likely poorly controlled. Continue on SSI w/ accuchecks    CKDIV: Cr is trending up again today. Likely secondary to lasix use. Nephro following and recs apprec    PAF: continue on eliquis. Rate controlled   Depression: severity unknown. Continue on home dose of zoloft    Morbid obesity: BMI 35.3. Complicates overall care & prognosis    HTN: continue on imdur, hydralazine, cardura, lasix    HLD: continue on statin    CAD: with remote PCI. Continue on imdur,  statin      DVT prophylaxis: eliquis Code Status: full  Family Communication: Disposition Plan: pt is still refusing SNF & HH  Level of care: Telemetry Cardiac  Status is: Inpatient Remains inpatient appropriate because: likely d/c home tomorrow    Consultants:  Nephro Cardio   Procedures:   Antimicrobials:    Subjective: Pt c/o fatigue   Objective: Vitals:   02/19/22 2319 02/20/22 0334 02/20/22 0500 02/20/22 0740  BP: (!) 165/73 (!) 147/66  (!) 146/65  Pulse: (!) 48   (!) 51  Resp: 16 20  20   Temp: 97.6 F (36.4 C) 98.6 F (37 C)  98 F (36.7 C)  TempSrc: Oral Oral  Oral  SpO2: 96% 97%  95%  Weight:   77.3 kg   Height:        Intake/Output Summary (Last 24 hours) at 02/20/2022 0758 Last data filed at 02/20/2022 0700 Gross per 24 hour  Intake 736.17 ml  Output 2450 ml  Net -1713.83 ml   Filed Weights   02/17/22 0500 02/18/22 0313 02/20/22 0500  Weight: 105.7 kg 105.3 kg 77.3 kg    Examination:  General exam: Appears calm & comfortable  Respiratory system: diminished breath sounds b/l otherwise clear  Cardiovascular system: S1 & S2+. No rubs or clicks. LE edema  Gastrointestinal system: Abd is soft, NT, ND & normal bowel sounds  Central nervous system: alert and awake. Moves all extremities  Psychiatry: judgement and insight appears poor. Appropriate mood and affect     Data Reviewed: I have personally reviewed  following labs and imaging studies  CBC: Recent Labs  Lab 02/16/22 0457 02/17/22 0616 02/18/22 0700 02/19/22 0614 02/20/22 0537  WBC 5.9 6.2 6.7 8.3 7.7  HGB 11.0* 10.9* 10.7* 10.9* 10.7*  HCT 34.5* 33.8* 35.0* 35.0* 33.2*  MCV 90.8 91.1 91.9 91.6 90.7  PLT 175 179 169 183 235   Basic Metabolic Panel: Recent Labs  Lab 02/15/22 1227 02/16/22 0457 02/17/22 0616 02/18/22 0700 02/19/22 0614 02/20/22 0537  NA 139 137 137 140 141 139  K 3.9 3.4* 3.7 3.6 3.3* 3.7  CL 90* 89* 90* 91* 94* 93*  CO2 34* 34* 36* 37* 37* 34*   GLUCOSE 281* 211* 207* 205* 110* 194*  BUN 82* 85* 92* 96* 97* 95*  CREATININE 2.92* 2.82* 2.72* 2.90* 2.95* 2.97*  CALCIUM 9.1 8.9 8.8* 9.2 9.1 9.1  MG 2.4 2.4 2.5* 2.5* 2.5*  --    GFR: Estimated Creatinine Clearance: 18.2 mL/min (A) (by C-G formula based on SCr of 2.97 mg/dL (H)). Liver Function Tests: No results for input(s): "AST", "ALT", "ALKPHOS", "BILITOT", "PROT", "ALBUMIN" in the last 168 hours. No results for input(s): "LIPASE", "AMYLASE" in the last 168 hours. No results for input(s): "AMMONIA" in the last 168 hours. Coagulation Profile: No results for input(s): "INR", "PROTIME" in the last 168 hours. Cardiac Enzymes: No results for input(s): "CKTOTAL", "CKMB", "CKMBINDEX", "TROPONINI" in the last 168 hours. BNP (last 3 results) No results for input(s): "PROBNP" in the last 8760 hours. HbA1C: No results for input(s): "HGBA1C" in the last 72 hours. CBG: Recent Labs  Lab 02/19/22 0805 02/19/22 1205 02/19/22 1533 02/19/22 2116 02/20/22 0737  GLUCAP 88 204* 243* 302* 154*   Lipid Profile: No results for input(s): "CHOL", "HDL", "LDLCALC", "TRIG", "CHOLHDL", "LDLDIRECT" in the last 72 hours. Thyroid Function Tests: No results for input(s): "TSH", "T4TOTAL", "FREET4", "T3FREE", "THYROIDAB" in the last 72 hours. Anemia Panel: No results for input(s): "VITAMINB12", "FOLATE", "FERRITIN", "TIBC", "IRON", "RETICCTPCT" in the last 72 hours. Sepsis Labs: No results for input(s): "PROCALCITON", "LATICACIDVEN" in the last 168 hours.  No results found for this or any previous visit (from the past 240 hour(s)).       Radiology Studies: No results found.      Scheduled Meds:  apixaban  2.5 mg Oral BID   ascorbic acid  500 mg Oral Daily   cholecalciferol  1,000 Units Oral Daily   dorzolamide-timolol  1 drop Left Eye BID   doxazosin  1 mg Oral BID   hydrALAZINE  100 mg Oral TID   insulin aspart  0-5 Units Subcutaneous QHS   insulin aspart  0-9 Units Subcutaneous  TID WC   isosorbide mononitrate  30 mg Oral Daily   linagliptin  5 mg Oral Daily   multivitamin with minerals  1 tablet Oral Daily   omega-3 acid ethyl esters  1 g Oral Daily   rosuvastatin  5 mg Oral Once per day on Mon Wed Fri   Continuous Infusions:  furosemide (LASIX) 200 mg in dextrose 5 % 100 mL (2 mg/mL) infusion 12 mg/hr (02/19/22 2338)     LOS: 11 days    Time spent: 25 mins     Wyvonnia Dusky, MD [facility] Pager [phone]  If 7PM-7AM, please contact night-coverage [email or web address] 02/20/2022, 7:58 AM

## 2022-02-20 NOTE — Inpatient Diabetes Management (Signed)
Inpatient Diabetes Program Recommendations  AACE/ADA: New Consensus Statement on Inpatient Glycemic Control  Target Ranges:  Prepandial:   less than 140 mg/dL      Peak postprandial:   less than 180 mg/dL (1-2 hours)      Critically ill patients:  140 - 180 mg/dL    Latest Reference Range & Units 02/19/22 08:05 02/19/22 12:05 02/19/22 15:33 02/19/22 21:16  Glucose-Capillary 70 - 99 mg/dL 88 204 (H) 243 (H) 302 (H)    Review of Glycemic Control  Diabetes history: DM2 Outpatient Diabetes medications: Glipizide XL 10 mg QAM, NPH 10-15 units after breakfast and before bedtime (not taking) Current orders for Inpatient glycemic control: Novolog 0-9 units TID with meals, Novolog 0-5 units QHS, Tradjenta 5 mg daily   Inpatient Diabetes Program Recommendations:     Insulin: Per MAR, patient is refusing insulin.   Oral DM medication: Patient received Glipizide 10 mg on 02/18/22. Since patient is refusing all insulin, may want to consider reordering Glipizide 5 mg daily (lower dose).  Thanks, Barnie Alderman, RN, MSN, [place] Diabetes Coordinator Inpatient Diabetes Program [phone] (Team Pager from 8am to [place])

## 2022-02-20 NOTE — Progress Notes (Signed)
Physical Therapy Treatment Patient Details Name: Kyle Roberson MRN: [id number] DOB: 10-09-1940 Today's Date: 02/20/2022   History of Present Illness Pt is an 81 y/o male presented to ED on 02/09/22 for abdominal distention and weight >20 lbs over last couple of weeks. Admitted for acute on chronic diastolic CHF, pulmonary HTN, and respiratory failure. 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 Pt able to stand without physical assist from the recliner but required cuing and significantly increased time and effort.  Pt was able to amb 2 x 12 feet this session but with very slow cadence with occasionally shuffling steps.  Pt reported no adverse symptoms during the session with SpO2 and HR WNL.  Pt will benefit from PT services in a SNF setting upon discharge to safely address deficits listed in patient problem list for decreased caregiver assistance and eventual return to PLOF.     Recommendations for follow up therapy are one component of a multi-disciplinary discharge planning process, led by the attending physician.  Recommendations may be updated based on patient status, additional functional criteria and insurance authorization.  Follow Up Recommendations  Skilled nursing-short term rehab (<3 hours/day) Can patient physically be transported by private vehicle: Yes   Assistance Recommended at Discharge Frequent or constant Supervision/Assistance  Patient can return home with the following A little help with walking and/or transfers;A little help with bathing/dressing/bathroom;Assistance with cooking/housework;Direct supervision/assist for medications management;Assist for transportation;Help with stairs or ramp for entrance   Equipment Recommendations  None recommended by PT    Recommendations for Other Services       Precautions / Restrictions  Precautions Precautions: Fall Precaution Comments: 2LO2 Restrictions Weight Bearing Restrictions: No     Mobility  Bed Mobility               General bed mobility comments: NT, pt in recliner    Transfers Overall transfer level: Needs assistance Equipment used: Rolling walker (2 wheels) Transfers: Sit to/from Stand Sit to Stand: Min guard           General transfer comment: Min verbal cues for increased trunk flexion    Ambulation/Gait Ambulation/Gait assistance: Min guard Gait Distance (Feet): 12 Feet x 2 Assistive device: Rolling walker (2 wheels) Gait Pattern/deviations: Wide base of support, Step-through pattern, Decreased step length - right, Decreased step length - left, Trunk flexed Gait velocity: decreased     General Gait Details: Pt able to amb 12 feet with very slow cadence and short B step length but steady without LOB   Stairs             Wheelchair Mobility    Modified Rankin (Stroke Patients Only)       Balance Overall balance assessment: Needs assistance Sitting-balance support: Bilateral upper extremity supported, Feet supported Sitting balance-Leahy Scale: Fair     Standing balance support: Bilateral upper extremity supported, Reliant on assistive device for balance, During functional activity Standing balance-Leahy Scale: Fair                              Cognition Arousal/Alertness: Awake/alert Behavior During Therapy: WFL for tasks assessed/performed Overall Cognitive Status: Within Functional Limits for tasks assessed  Exercises Total Joint Exercises Ankle Circles/Pumps: Strengthening, Both, 10 reps, 5 reps (with manual resistance) Towel Squeeze: Strengthening, Both, 5 reps, 10 reps Long Arc Quad: Strengthening, Both, 10 reps, 15 reps (with manual resistance) Knee Flexion: Strengthening, Both, 10 reps, 15 reps (with manual resistance) Marching in  Standing: Strengthening, Both, 5 reps, Standing Other Exercises Other Exercises: Pt education/review provided on physiological benefits of activity    General Comments        Pertinent Vitals/Pain Pain Assessment Pain Assessment: No/denies pain    Home Living                          Prior Function            PT Goals (current goals can now be found in the care plan section) Progress towards PT goals: Progressing toward goals    Frequency    Min 2X/week      PT Plan Current plan remains appropriate    Co-evaluation              AM-PAC PT "6 Clicks" Mobility   Outcome Measure  Help needed turning from your back to your side while in a flat bed without using bedrails?: A Little Help needed moving from lying on your back to sitting on the side of a flat bed without using bedrails?: A Little Help needed moving to and from a bed to a chair (including a wheelchair)?: A Little Help needed standing up from a chair using your arms (e.g., wheelchair or bedside chair)?: A Little Help needed to walk in hospital room?: A Little Help needed climbing 3-5 steps with a railing? : A Lot 6 Click Score: 17    End of Session Equipment Utilized During Treatment: Gait belt;Oxygen Activity Tolerance: Patient tolerated treatment well Patient left: in chair;with call bell/phone within reach Nurse Communication: Mobility status PT Visit Diagnosis: Unsteadiness on feet (R26.81);Other abnormalities of gait and mobility (R26.89);Muscle weakness (generalized) (M62.81);Difficulty in walking, not elsewhere classified (R26.2)     Time: 1884-1660 PT Time Calculation (min) (ACUTE ONLY): 23 min  Charges:  $Gait Training: 8-22 mins $Therapeutic Exercise: 8-22 mins                     D. Scott Aireana Ryland PT, DPT 02/20/22, 12:10 PM

## 2022-02-20 NOTE — Progress Notes (Signed)
[place], [place] 02/20/22  Subjective:   Hospital day # 11 Patient known to our practice from previous outpatient follow-up.Last seen in June 2022 by Dr Holley Raring.  Patient seen sitting at bedside Alert and oriented Remains on room Lower extremity edema slowly improving   Renal: 11/30 0701 - 12/01 0700 In: 736.2 [P.O.:600; I.V.:136.2] Out: 2450 [Urine:2450] Lab Results  Component Value Date   CREATININE 2.97 (H) 02/20/2022   CREATININE 2.95 (H) 02/19/2022   CREATININE 2.90 (H) 02/18/2022   Furosemide drip 12 mg/h  Objective:  Vital signs in last 24 hours:  Temp:  [97.6 F (36.4 C)-98.6 F (37 C)] 98.5 F (36.9 C) (12/01 1144) Pulse Rate:  [48-57] 51 (12/01 1146) Resp:  [16-20] 18 (12/01 1144) BP: (127-165)/(53-73) 127/53 (12/01 1144) SpO2:  [88 %-100 %] 94 % (12/01 1146) Weight:  [77.3 kg] 77.3 kg (12/01 0500)  Weight change:  Filed Weights   02/17/22 0500 02/18/22 0313 02/20/22 0500  Weight: 105.7 kg 105.3 kg 77.3 kg    Intake/Output:    Intake/Output Summary (Last 24 hours) at 02/20/2022 1408 Last data filed at 02/20/2022 1236 Gross per 24 hour  Intake 712.49 ml  Output 2550 ml  Net -1837.51 ml    Physical exam: General appearance: Sitting up, no acute distress HEENT: Moist oral mucous membranes Pulmonary: Lungs are clear to auscultation, normal breathing effort Cardiovascular: Irregular rhythm, soft systolic murmur Abdomen: Distended, soft, nontender Extremities: Dependent pitting edema present Skin: Hyperkeratotic skin over both legs Neuro: Alert, able to answer simple questions    Basic Metabolic Panel:  Recent Labs  Lab 02/15/22 1227 02/16/22 0457 02/17/22 0616 02/18/22 0700 02/19/22 0614 02/20/22 0537  NA 139 137 137 140 141 139  K 3.9 3.4* 3.7 3.6 3.3* 3.7  CL 90* 89* 90* 91* 94* 93*  CO2 34* 34* 36* 37* 37* 34*  GLUCOSE 281* 211* 207* 205* 110* 194*  BUN 82* 85* 92* 96* 97* 95*  CREATININE 2.92*  2.82* 2.72* 2.90* 2.95* 2.97*  CALCIUM 9.1 8.9 8.8* 9.2 9.1 9.1  MG 2.4 2.4 2.5* 2.5* 2.5*  --       CBC: Recent Labs  Lab 02/16/22 0457 02/17/22 0616 02/18/22 0700 02/19/22 0614 02/20/22 0537  WBC 5.9 6.2 6.7 8.3 7.7  HGB 11.0* 10.9* 10.7* 10.9* 10.7*  HCT 34.5* 33.8* 35.0* 35.0* 33.2*  MCV 90.8 91.1 91.9 91.6 90.7  PLT 175 179 169 183 176      No results found for: "HEPBSAG", "HEPBSAB", "HEPBIGM"    Microbiology:  No results found for this or any previous visit (from the past 240 hour(s)).  Coagulation Studies: No results for input(s): "LABPROT", "INR" in the last 72 hours.  Urinalysis: No results for input(s): "COLORURINE", "LABSPEC", "PHURINE", "GLUCOSEU", "HGBUR", "BILIRUBINUR", "KETONESUR", "PROTEINUR", "UROBILINOGEN", "NITRITE", "LEUKOCYTESUR" in the last 72 hours.  Invalid input(s): "APPERANCEUR"    Imaging: No results found.   Medications:    furosemide (LASIX) 200 mg in dextrose 5 % 100 mL (2 mg/mL) infusion 12 mg/hr (02/20/22 0700)    apixaban  2.5 mg Oral BID   ascorbic acid  500 mg Oral Daily   cholecalciferol  1,000 Units Oral Daily   dorzolamide-timolol  1 drop Left Eye BID   doxazosin  1 mg Oral BID   hydrALAZINE  100 mg Oral TID   insulin aspart  0-5 Units Subcutaneous QHS   insulin aspart  0-9 Units Subcutaneous TID WC   isosorbide mononitrate  30 mg Oral Daily  linagliptin  5 mg Oral Daily   multivitamin with minerals  1 tablet Oral Daily   omega-3 acid ethyl esters  1 g Oral Daily   rosuvastatin  5 mg Oral Once per day on Mon Wed Fri   acetaminophen **OR** acetaminophen, ipratropium-albuterol, magnesium hydroxide, ondansetron **OR** ondansetron (ZOFRAN) IV, mouth rinse, traZODone  Assessment/ Plan:  81 y.o. 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  Lately creatinine has fluctuated between 2.4-3.06 Currently requiring IV furosemide infusion for # acute on chronic diastolic CHF in the setting of # pulmonary hypertension and # coronary artery disease and # atrial fibrillation.  Plan: Creatinine stable. Lower extremity edema remains but improving slowly. May have component of lymphedema. Will defer to Cardiology for management of Furosemide drip.     LOS: [place] 12/1/20232:08 PM  [place], [place]

## 2022-02-20 NOTE — Progress Notes (Signed)
Advanced Heart Failure Rounding Note   Subjective:    Continues to diurese on lasix gtt at 12. Weight down ~ 40 pounds. Weight incorrect today Scr stable at 2.95   Breathing better. Denies orthopnea or PND. Anxious to go home     Objective:   Weight Range:  Vital Signs:   Temp:  [97.6 F (36.4 C)-98.6 F (37 C)] 98.5 F (36.9 C) (12/01 1144) Pulse Rate:  [48-57] 51 (12/01 1146) Resp:  [16-20] 18 (12/01 1144) BP: (127-165)/(53-73) 127/53 (12/01 1144) SpO2:  [88 %-100 %] 94 % (12/01 1146) Weight:  [77.3 kg] 77.3 kg (12/01 0500) Last BM Date : 02/18/22  Weight change: Filed Weights   02/17/22 0500 02/18/22 0313 02/20/22 0500  Weight: 105.7 kg 105.3 kg 77.3 kg    Intake/Output:   Intake/Output Summary (Last 24 hours) at 02/20/2022 1347 Last data filed at 02/20/2022 1236 Gross per 24 hour  Intake 712.49 ml  Output 3250 ml  Net -2537.51 ml     Physical Exam: General: Sitting up in chair  No resp difficulty HEENT: normal L eye abnormality Neck: supple. JVP 7 with prominent v wave . Carotids 2+ bilat; no bruits. No lymphadenopathy or thryomegaly appreciated. Cor: PMI nondisplaced. Regular rate & rhythm. No rubs, gallops or murmurs. Lungs: clear Abdomen: soft, nontender, nondistended. No hepatosplenomegaly. No bruits or masses. Good bowel sounds. Extremities: no cyanosis, clubbing, rash, severe chronic venous stasis changes no signficant edema Neuro: alert & orientedx3, cranial nerves grossly intact. moves all 4 extremities w/o difficulty. Affect pleasant  Telemetry: SInus 50s Personally reviewed  Labs: Basic Metabolic Panel: Recent Labs  Lab 02/15/22 1227 02/16/22 0457 02/17/22 0616 02/18/22 0700 02/19/22 0614 02/20/22 0537  NA 139 137 137 140 141 139  K 3.9 3.4* 3.7 3.6 3.3* 3.7  CL 90* 89* 90* 91* 94* 93*  CO2 34* 34* 36* 37* 37* 34*  GLUCOSE 281* 211* 207* 205* 110* 194*  BUN 82* 85* 92* 96* 97* 95*  CREATININE 2.92* 2.82* 2.72* 2.90* 2.95* 2.97*   CALCIUM 9.1 8.9 8.8* 9.2 9.1 9.1  MG 2.4 2.4 2.5* 2.5* 2.5*  --     Liver Function Tests: No results for input(s): "AST", "ALT", "ALKPHOS", "BILITOT", "PROT", "ALBUMIN" in the last 168 hours. No results for input(s): "LIPASE", "AMYLASE" in the last 168 hours. No results for input(s): "AMMONIA" in the last 168 hours.  CBC: Recent Labs  Lab 02/16/22 0457 02/17/22 0616 02/18/22 0700 02/19/22 0614 02/20/22 0537  WBC 5.9 6.2 6.7 8.3 7.7  HGB 11.0* 10.9* 10.7* 10.9* 10.7*  HCT 34.5* 33.8* 35.0* 35.0* 33.2*  MCV 90.8 91.1 91.9 91.6 90.7  PLT 175 179 169 183 176    Cardiac Enzymes: No results for input(s): "CKTOTAL", "CKMB", "CKMBINDEX", "TROPONINI" in the last 168 hours.  BNP: BNP (last 3 results) Recent Labs    08/25/21 1751 12/02/21 1657 02/08/22 1919  BNP 185.6* 260.5* 263.5*    ProBNP (last 3 results) No results for input(s): "PROBNP" in the last 8760 hours.    Other results:  Imaging: No results found.   Medications:     Scheduled Medications:  apixaban  2.5 mg Oral BID   ascorbic acid  500 mg Oral Daily   cholecalciferol  1,000 Units Oral Daily   dorzolamide-timolol  1 drop Left Eye BID   doxazosin  1 mg Oral BID   hydrALAZINE  100 mg Oral TID   insulin aspart  0-5 Units Subcutaneous QHS   insulin aspart  0-9 Units Subcutaneous TID WC   isosorbide mononitrate  30 mg Oral Daily   linagliptin  5 mg Oral Daily   multivitamin with minerals  1 tablet Oral Daily   omega-3 acid ethyl esters  1 g Oral Daily   rosuvastatin  5 mg Oral Once per day on Mon Wed Fri    Infusions:  furosemide (LASIX) 200 mg in dextrose 5 % 100 mL (2 mg/mL) infusion 12 mg/hr (02/20/22 0700)    PRN Medications: acetaminophen **OR** acetaminophen, ipratropium-albuterol, magnesium hydroxide, ondansetron **OR** ondansetron (ZOFRAN) IV, mouth rinse, traZODone   Assessment/Plan:   Acute on chronic diastolic CHF with promine R-sided HF:  Echo 02/21: EF 55-60%, RV okay, RVSP 56  mmHg, dilated pulmonary artery, dilated IVC -Echo 11/23: EF 55-60%, interventricular septum flattened in systole consistent with RV pressure overload, RV function okay, RVSP severely elevated 83 mmHg, mild MR, moderate TR, dilated pulmonary artery, dilated IVC -Admitted with massive volume overload (> 23 kg since January) after failing outpatient furoscix and metolazone - Remains on lasix gtt at 12/hr. Has diuresed > 40 pounds so far. Volume now looks good.  - Scr stable at 2.9  -GDMT limited by renal impairment - Can stop lasix gtt. Switch to torsemide 40 bid (was on 40/20 at home) tomorrow  -Avoid SGLT2i with obesity and poor hygiene -Continue hydralazine 100 mg TID + imdur 30 daily - Ok for d/c from HF standpoint. I will arrange HF f/u next week.    2. 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 Improved with diuresis - consider outpatient sleep study/ONOX  - doubt he will comply with CPAP but may benefit from nocturnal O2 - I do not think [facility] will change our management except to ensure that we have him adequately diuresed. Will defer for now.   3. CAD: -Hx remote stenting to LAD and diagonal -Minimal troponin elevation on admit likely d/t demand ischemia -No s/s.Marland Kitchen No further ischemic workup planned -Continue statin -No aspirin since anticoagulated   4. Permanent atrial fibrillation: -Rate 50s without rate control meds -On Eliquis 2.5 mg BID (appropriate dose for age and renal function)   5. AKI on CKD IV: -Scr variable, has been averaging 2-2.5, up to 3 this admit with diuresis.Stable now att 2.9  -Suspect cardiorenal -Nephrology consulted - Follow BMETs   6. HTN: -Options limited by renal impairment -With  CKD IV would not push BP < 120    Length of Stay: [place] 02/20/2022, 1:47 PM  Advanced Heart Failure Team Pager [phone] (M-F; 7a - 4p)  Please contact [facility] for night-coverage after hours (4p -7a ) and weekends on [email or web address]

## 2022-02-20 NOTE — Plan of Care (Signed)
  Problem: Cardiac: Goal: Ability to achieve and maintain adequate cardiopulmonary perfusion will improve Outcome: Progressing   Problem: Education: Goal: Knowledge of General Education information will improve Description: Including pain rating scale, medication(s)/side effects and non-pharmacologic comfort measures Outcome: Progressing   Problem: Health Behavior/Discharge Planning: Goal: Ability to manage health-related needs will improve Outcome: Progressing

## 2022-02-20 NOTE — Progress Notes (Signed)
Occupational Therapy Treatment Patient Details Name: Kyle Roberson MRN: [id number] DOB: 1940-10-27 Today's Date: 02/20/2022   History of present illness Pt is an 81 y/o male presented to ED on 02/09/22 for abdominal distention and weight >20 lbs over last couple of weeks. Admitted for acute on chronic diastolic CHF, pulmonary HTN, and respiratory failure. PMH: CAD, T2DM, HTN, CKD stage 4, Afib, pulmonary HTN, traumatic SDH s/p craniotomy (11/22)   OT comments  Upon entering the entering the room, pt seated in recliner chair and agreeable to OT intervention. Pt declines functional mobility, sit <>stand transfers, and self care tasks. He does report back itching and performs multiple anterior weight shifts in chair without assistance for therapist to apply his lotion from home. Pt performs glute squeezes and knee extension exercises while seated in chair with encouragement. Pt politely declines all other therapeutic intervention and remains in recliner chair with all needs within reach.    Recommendations for follow up therapy are one component of a multi-disciplinary discharge planning process, led by the attending physician.  Recommendations may be updated based on patient status, additional functional criteria and insurance authorization.    Follow Up Recommendations  Follow physician's recommendations for discharge plan and follow up therapies     Assistance Recommended at Discharge Frequent or constant Supervision/Assistance  Patient can return home with the following  A lot of help with walking and/or transfers;A lot of help with bathing/dressing/bathroom;Assistance with cooking/housework;Assist for transportation;Direct supervision/assist for medications management;Help with stairs or ramp for entrance   Equipment Recommendations  None recommended by OT       Precautions / Restrictions Precautions Precautions: Fall Precaution Comments: 2LO2 Restrictions Weight Bearing  Restrictions: No       Mobility Bed Mobility               General bed mobility comments: NT, pt in recliner    Transfers                   General transfer comment: pt refusal     Balance Overall balance assessment: Needs assistance Sitting-balance support: Bilateral upper extremity supported, Feet supported Sitting balance-Leahy Scale: Good                                     ADL either performed or assessed with clinical judgement    Extremity/Trunk Assessment Upper Extremity Assessment Upper Extremity Assessment: Overall WFL for tasks assessed;Generalized weakness   Lower Extremity Assessment Lower Extremity Assessment: Generalized weakness        Vision Patient Visual Report: No change from baseline            Cognition Arousal/Alertness: Awake/alert Behavior During Therapy: WFL for tasks assessed/performed Overall Cognitive Status: Within Functional Limits for tasks assessed                                                     Pertinent Vitals/ Pain       Pain Assessment Pain Assessment: No/denies pain         Frequency  Min 2X/week        Progress Toward Goals  OT Goals(current goals can now be found in the care plan section)     Acute Rehab OT Goals Patient Stated Goal: to go  home OT Goal Formulation: With patient Time For Goal Achievement: 02/25/22 Potential to Achieve Goals: Fair  Plan Frequency remains appropriate       AM-PAC OT "6 Clicks" Daily Activity     Outcome Measure   Help from another person eating meals?: None Help from another person taking care of personal grooming?: None Help from another person toileting, which includes using toliet, bedpan, or urinal?: A Lot Help from another person bathing (including washing, rinsing, drying)?: A Lot Help from another person to put on and taking off regular upper body clothing?: A Little   6 Click Score: 15    End of Session     OT Visit Diagnosis: Other abnormalities of gait and mobility (R26.89);Muscle weakness (generalized) (M62.81);Pain Pain - Right/Left: Left Pain - part of body: Ankle and joints of foot   Activity Tolerance Patient tolerated treatment well   Patient Left with call bell/phone within reach;in chair   Nurse Communication Mobility status;Patient requests pain meds        Time: 5797-2820 OT Time Calculation (min): 20 min  Charges: OT General Charges $OT Visit: 1 Visit OT Treatments $Therapeutic Activity: 8-22 mins  Darleen Crocker, MS, OTR/L , CBIS ascom [phone]  02/20/22, 2:02 PM

## 2022-02-21 DIAGNOSIS — N184 Chronic kidney disease, stage 4 (severe): Secondary | ICD-10-CM | POA: Diagnosis not present

## 2022-02-21 DIAGNOSIS — I50813 Acute on chronic right heart failure: Secondary | ICD-10-CM

## 2022-02-21 DIAGNOSIS — E1169 Type 2 diabetes mellitus with other specified complication: Secondary | ICD-10-CM

## 2022-02-21 DIAGNOSIS — I5033 Acute on chronic diastolic (congestive) heart failure: Secondary | ICD-10-CM | POA: Diagnosis not present

## 2022-02-21 LAB — BASIC METABOLIC PANEL
Anion gap: 11 (ref 5–15)
BUN: 97 mg/dL — ABNORMAL HIGH (ref 8–23)
CO2: 34 mmol/L — ABNORMAL HIGH (ref 22–32)
Calcium: 9.2 mg/dL (ref 8.9–10.3)
Chloride: 94 mmol/L — ABNORMAL LOW (ref 98–111)
Creatinine, Ser: 2.9 mg/dL — ABNORMAL HIGH (ref 0.61–1.24)
GFR, Estimated: 21 mL/min — ABNORMAL LOW (ref >60)
Glucose, Bld: 194 mg/dL — ABNORMAL HIGH (ref 70–99)
Potassium: 3.5 mmol/L (ref 3.5–5.1)
Sodium: 139 mmol/L (ref 135–145)

## 2022-02-21 LAB — CBC
HCT: 34.6 % — ABNORMAL LOW (ref 39.0–52.0)
Hemoglobin: 11 g/dL — ABNORMAL LOW (ref 13.0–17.0)
MCH: 29 pg (ref 26.0–34.0)
MCHC: 31.8 g/dL (ref 30.0–36.0)
MCV: 91.3 fL (ref 80.0–100.0)
Platelets: 169 10*3/uL (ref 150–400)
RBC: 3.79 MIL/uL — ABNORMAL LOW (ref 4.22–5.81)
RDW: 15 % (ref 11.5–15.5)
WBC: 8.8 10*3/uL (ref 4.0–10.5)
nRBC: 0 % (ref 0.0–0.2)

## 2022-02-21 LAB — GLUCOSE, CAPILLARY
Glucose-Capillary: 228 mg/dL — ABNORMAL HIGH (ref 70–99)
Glucose-Capillary: 240 mg/dL — ABNORMAL HIGH (ref 70–99)

## 2022-02-21 MED ORDER — ISOSORBIDE MONONITRATE ER 30 MG PO TB24: 30.0000 mg | ORAL_TABLET | Freq: Every day | ORAL | Status: DC

## 2022-02-21 MED ORDER — FUROSEMIDE 80 MG PO TABS: 80.0000 mg | ORAL_TABLET | Freq: Two times a day (BID) | ORAL | 0 refills | Status: DC

## 2022-02-21 NOTE — Plan of Care (Signed)
  Problem: Education: Goal: Ability to describe self-care measures that may prevent or decrease complications (Diabetes Survival Skills Education) will improve Outcome: Not Progressing   Problem: Fluid Volume: Goal: Ability to maintain a balanced intake and output will improve Outcome: Progressing   

## 2022-02-21 NOTE — Progress Notes (Signed)
Patient has refused any insulin coverage for cbg. Significant other [name] has called multiple times requesting that his cbg be checked more frequently. Patient has not has any hypoglycemic events. Bedtime CBG was 259 Midnight CBG 220. Patient has been rude and demanding with staff. Will educate spouse that CBGs are not required every hours per her request. Patient is stable.

## 2022-02-21 NOTE — Progress Notes (Signed)
[place], [place] 02/21/22  Subjective:   Hospital day # 12 Patient known to our practice from previous outpatient follow-up.Last seen in June 2022 by Dr Holley Raring.  Patient seen sitting up in bed, remains on room air Lower extremity edema remains greatest in thighs and abdomen Tolerating high rate furosemide drip appropriately Creatinine stable   Renal: 12/01 0701 - 12/02 0700 In: 1373.4 [P.O.:1320; I.V.:53.4] Out: 2000 [Urine:2000] Lab Results  Component Value Date   CREATININE 2.90 (H) 02/21/2022   CREATININE 2.97 (H) 02/20/2022   CREATININE 2.95 (H) 02/19/2022   Furosemide drip 12 mg/h  Objective:  Vital signs in last 24 hours:  Temp:  [97.6 F (36.4 C)-98.4 F (36.9 C)] 97.7 F (36.5 C) (12/02 0421) Pulse Rate:  [47-51] 51 (12/02 0421) Resp:  [16-20] 20 (12/02 0421) BP: (133-143)/(52-58) 138/56 (12/02 0421) SpO2:  [93 %-96 %] 93 % (12/02 0421) Weight:  [75.2 kg] 75.2 kg (12/02 0500)  Weight change: -2.1 kg Filed Weights   02/18/22 0313 02/20/22 0500 02/21/22 0500  Weight: 105.3 kg 77.3 kg 75.2 kg    Intake/Output:    Intake/Output Summary (Last 24 hours) at 02/21/2022 1205 Last data filed at 02/21/2022 0908 Gross per 24 hour  Intake 989.44 ml  Output 2400 ml  Net -1410.56 ml    Physical exam: General appearance: Sitting up, no acute distress HEENT: Moist oral mucous membranes Pulmonary: Lungs are clear to auscultation, normal breathing effort Cardiovascular: Irregular rhythm, soft systolic murmur Abdomen: Distended, soft, nontender Extremities: Dependent pitting edema present Skin: Hyperkeratotic skin over both legs Neuro: Alert, able to answer simple questions    Basic Metabolic Panel:  Recent Labs  Lab 02/15/22 1227 02/16/22 0457 02/17/22 0616 02/18/22 0700 02/19/22 0614 02/20/22 0537 02/21/22 0518  NA 139 137 137 140 141 139 139  K 3.9 3.4* 3.7 3.6 3.3* 3.7 3.5  CL 90* 89* 90* 91* 94* 93* 94*  CO2 34*  34* 36* 37* 37* 34* 34*  GLUCOSE 281* 211* 207* 205* 110* 194* 194*  BUN 82* 85* 92* 96* 97* 95* 97*  CREATININE 2.92* 2.82* 2.72* 2.90* 2.95* 2.97* 2.90*  CALCIUM 9.1 8.9 8.8* 9.2 9.1 9.1 9.2  MG 2.4 2.4 2.5* 2.5* 2.5*  --   --       CBC: Recent Labs  Lab 02/17/22 0616 02/18/22 0700 02/19/22 0614 02/20/22 0537 02/21/22 0518  WBC 6.2 6.7 8.3 7.7 8.8  HGB 10.9* 10.7* 10.9* 10.7* 11.0*  HCT 33.8* 35.0* 35.0* 33.2* 34.6*  MCV 91.1 91.9 91.6 90.7 91.3  PLT 179 169 183 176 169      No results found for: "HEPBSAG", "HEPBSAB", "HEPBIGM"    Microbiology:  No results found for this or any previous visit (from the past 240 hour(s)).  Coagulation Studies: No results for input(s): "LABPROT", "INR" in the last 72 hours.  Urinalysis: No results for input(s): "COLORURINE", "LABSPEC", "PHURINE", "GLUCOSEU", "HGBUR", "BILIRUBINUR", "KETONESUR", "PROTEINUR", "UROBILINOGEN", "NITRITE", "LEUKOCYTESUR" in the last 72 hours.  Invalid input(s): "APPERANCEUR"    Imaging: No results found.   Medications:      apixaban  2.5 mg Oral BID   ascorbic acid  500 mg Oral Daily   cholecalciferol  1,000 Units Oral Daily   dorzolamide-timolol  1 drop Left Eye BID   doxazosin  1 mg Oral BID   furosemide  80 mg Oral BID   hydrALAZINE  100 mg Oral TID   insulin aspart  0-5 Units Subcutaneous QHS   insulin aspart  0-9 Units Subcutaneous TID WC   isosorbide mononitrate  30 mg Oral Daily   linagliptin  5 mg Oral Daily   multivitamin with minerals  1 tablet Oral Daily   omega-3 acid ethyl esters  1 g Oral Daily   rosuvastatin  5 mg Oral Once per day on Mon Wed Fri   acetaminophen **OR** acetaminophen, ipratropium-albuterol, magnesium hydroxide, ondansetron **OR** ondansetron (ZOFRAN) IV, mouth rinse, traZODone  Assessment/ Plan:  81 y.o. male with coronary artery disease, remote history of PCI, type 2 diabetes, GERD, hypertension, dyslipidemia, chronic diastolic CHF, pulmonary hypertension  chronic kidney disease, permanent A-fib, history of traumatic subdural hematoma status postcraniotomy admitted on 02/09/2022 for Acute on chronic diastolic (congestive) heart failure (HCC) [L97.47] Systolic congestive heart failure, unspecified HF chronicity ([place]) [I50.20]  #Acute kidney injury on chronic kidney disease stage IV Baseline creatinine appears to be 2.4/GFR 26 from 02/10/2022.  Lately creatinine has fluctuated between 2.4-3.06 Currently requiring IV furosemide infusion for # acute on chronic diastolic CHF in the setting of # pulmonary hypertension and # coronary artery disease and # atrial fibrillation.  Plan: Creatinine remains stable.  Maintains a large volume of fluid in thighs and abdomen.  Continue furosemide drip at current rate.  Will defer to cardiology furosemide drip management. 2 L urine output recorded in 24 hours.   LOS: 12 Kyle Roberson 12/2/202312:05 PM  [facility] [place], [place]

## 2022-02-21 NOTE — Progress Notes (Signed)
Rounding Note    Patient Name: Kyle Roberson Date of Encounter: 02/21/2022  [place] Cardiologist: Ida Rogue, MD   Subjective   UOP -2L. Kidney function stable today. Patient wanting to go home.   Inpatient Medications    Scheduled Meds:  apixaban  2.5 mg Oral BID   ascorbic acid  500 mg Oral Daily   cholecalciferol  1,000 Units Oral Daily   dorzolamide-timolol  1 drop Left Eye BID   doxazosin  1 mg Oral BID   furosemide  80 mg Oral BID   hydrALAZINE  100 mg Oral TID   insulin aspart  0-5 Units Subcutaneous QHS   insulin aspart  0-9 Units Subcutaneous TID WC   isosorbide mononitrate  30 mg Oral Daily   linagliptin  5 mg Oral Daily   multivitamin with minerals  1 tablet Oral Daily   omega-3 acid ethyl esters  1 g Oral Daily   rosuvastatin  5 mg Oral Once per day on Mon Wed Fri   Continuous Infusions:  PRN Meds: acetaminophen **OR** acetaminophen, ipratropium-albuterol, magnesium hydroxide, ondansetron **OR** ondansetron (ZOFRAN) IV, mouth rinse, traZODone   Vital Signs    Vitals:   02/20/22 2001 02/21/22 0020 02/21/22 0421 02/21/22 0500  BP: (!) 143/58 (!) 133/58 (!) 138/56   Pulse: (!) 49 (!) 47 (!) 51   Resp: 17 19 20    Temp: 97.6 F (36.4 C) 98.4 F (36.9 C) 97.7 F (36.5 C)   TempSrc: Oral Oral Oral   SpO2: 96% 96% 93%   Weight:    75.2 kg  Height:        Intake/Output Summary (Last 24 hours) at 02/21/2022 1039 Last data filed at 02/21/2022 0908 Gross per 24 hour  Intake 1007.99 ml  Output 2400 ml  Net -1392.01 ml      02/21/2022    5:00 AM 02/20/2022    5:00 AM 02/18/2022    3:13 AM  Last 3 Weights  Weight (lbs) 165 lb 12.6 oz 170 lb 6.7 oz 232 lb 2.3 oz  Weight (kg) 75.2 kg 77.3 kg 105.3 kg        ECG    No new - Personally Reviewed  Physical Exam   GEN: No acute distress.   Neck: + JVD Cardiac: RRR, no murmurs, rubs, or gallops.  Respiratory: Clear to auscultation bilaterally. GI: Soft, nontender,  non-distended  MS: No edema; No deformity. Neuro:  Nonfocal  Psych: Normal affect   Labs    High Sensitivity Troponin:   Recent Labs  Lab 02/08/22 1919 02/08/22 2210  TROPONINIHS 22* 21*     Chemistry Recent Labs  Lab 02/17/22 0616 02/18/22 0700 02/19/22 0614 02/20/22 0537 02/21/22 0518  NA 137 140 141 139 139  K 3.7 3.6 3.3* 3.7 3.5  CL 90* 91* 94* 93* 94*  CO2 36* 37* 37* 34* 34*  GLUCOSE 207* 205* 110* 194* 194*  BUN 92* 96* 97* 95* 97*  CREATININE 2.72* 2.90* 2.95* 2.97* 2.90*  CALCIUM 8.8* 9.2 9.1 9.1 9.2  MG 2.5* 2.5* 2.5*  --   --   GFRNONAA 22* 21* 20* 20* 21*  ANIONGAP 11 12 10 12 11     Lipids No results for input(s): "CHOL", "TRIG", "HDL", "LABVLDL", "LDLCALC", "CHOLHDL" in the last 168 hours.  Hematology Recent Labs  Lab 02/19/22 0614 02/20/22 0537 02/21/22 0518  WBC 8.3 7.7 8.8  RBC 3.82* 3.66* 3.79*  HGB 10.9* 10.7* 11.0*  HCT 35.0* 33.2* 34.6*  MCV 91.6  90.7 91.3  MCH 28.5 29.2 29.0  MCHC 31.1 32.2 31.8  RDW 15.0 14.9 15.0  PLT 183 176 169   Thyroid No results for input(s): "TSH", "FREET4" in the last 168 hours.  BNPNo results for input(s): "BNP", "PROBNP" in the last 168 hours.  DDimer No results for input(s): "DDIMER" in the last 168 hours.   Radiology    No results found.  Cardiac Studies   Echo 01/2022  1. Left ventricular ejection fraction, by estimation, is 55 to 60%. The  left ventricle has normal function. The left ventricle has no regional  wall motion abnormalities. The left ventricular internal cavity size was  mildly dilated. There is mild left  ventricular hypertrophy. Left ventricular diastolic parameters are  indeterminate. There is the interventricular septum is flattened in  systole, consistent with right ventricular pressure overload.   2. Right ventricular systolic function is normal. The right ventricular  size is mildly enlarged. Mildly increased right ventricular wall  thickness. There is severely elevated  pulmonary artery systolic pressure.  The estimated right ventricular systolic  pressure is 10.2 mmHg.   3. Left atrial size was mildly dilated.   4. Right atrial size was mildly dilated.   5. The mitral valve is normal in structure. Mild mitral valve  regurgitation. No evidence of mitral stenosis.   6. Tricuspid valve regurgitation is moderate.   7. The aortic valve is calcified. Aortic valve regurgitation is mild.  Mild aortic valve stenosis. Aortic valve area, by VTI measures 1.45 cm.  Aortic valve mean gradient measures 7.0 mmHg.   8. Moderately dilated pulmonary artery.   9. The inferior vena cava is dilated in size with <50% respiratory  variability, suggesting right atrial pressure of 15 mmHg.   Echo 12/2019   1. Left ventricular ejection fraction, by estimation, is 55 to 60%. The  left ventricle has normal function. The left ventricle has no regional  wall motion abnormalities. There is mild left ventricular hypertrophy.  Left ventricular diastolic parameters  are indeterminate.   2. Right ventricular systolic function is normal. The right ventricular  size is normal. There is moderately elevated pulmonary artery systolic  pressure. The estimated right ventricular systolic pressure is 58.5 mmHg.   3. Left atrial size was moderately dilated.   4. The mitral valve is normal in structure and function. Mild to moderate  mitral valve regurgitation. No evidence of mitral stenosis.   5. The aortic valve is normal in structure and function. Aortic valve  regurgitation is mild. Mild aortic valve sclerosis is present, with no  evidence of aortic valve stenosis.   6. Moderately dilated pulmonary artery.   7. The inferior vena cava is dilated in size with <50% respiratory  variability, suggesting right atrial pressure of 15 mmHg.     Patient Profile     81 y.o. male with history of chronic diastolic CHF, CAD s/p PCI to LAD and diagonal, stage IV CKD, permanent AF, obesity, DM II,  hx traumatic subdural hematoma s/p craniotomy. Admitted with massive volume overload after failing outpatient diuretics.    Assessment & Plan   Acute on chronic diastolic CHF with R sided heart failure - Echo 11/23 showed LVEF 55-60%, interventricular septum flattened in systolic consistent with RV pressure overload, RVSP severely reduced, mild MR, moderate TR, dilated pulmonary artery, dilated IVC - admitted with volume overload - lasix drip - Scr stable - GDMT limited by kidney disease - IV lasix changed to lasix 80mg BID - avoid SGLT2i with  obesity and poor hygiene - continue Hydralazine 100mg  TID and Imdur 30mg  daily - plan is to d/c with close follow-up with heart failure clinic  Pulmonary Hypertension - echo showed LVEF 55-60%, intraventricular septum flattened in systole consistent with RV pressure overload. RV function, RVSP severely elevated 83mmHg, mild MR and moderate TR, dilated pulmonary artery, dilated IVC - consider outpatient sleep study - plan to defer RHC at this time.  CAD - h/o stenting to the LAD and diagonal - minimal troponin elevation on admission d/t demand ischemia - no chest pain reported - no further work-up indicated at this time - No ASA given anticoagulation  Permanent Afib - rates in the 50s not on rate controlling medications - on Eliquis 2.5mg BID  AKI on CKD stage IV - Scr 2-2.5 - Scr up to 3 on this admission - suspect cardiorenal - nephrology consulted  HTN - limited by renal impairment - BP mildly elevated - continue Hydralazine, Cardura and Imdur   For questions or updates, please contact [place] Please consult [email or web address] for contact info under        Signed, Deyanira Fesler Ninfa Meeker, PA-C  02/21/2022, 10:39 AM

## 2022-02-21 NOTE — Discharge Summary (Signed)
Physician Discharge Summary  Kyle Roberson JJK:[id number] DOB: [birth year] DOA: 02/09/2022  PCP: Glean Hess, MD  Admit date: 02/09/2022 Discharge date: 02/21/2022  Admitted From: home  Disposition:  home  Recommendations for Outpatient Follow-up:  Follow up with PCP in 1-2 weeks F/u w/ cardio, [name], in 2-3 days. Needs BMP to check Cr/GFR F/u w/ nephro, Dr. Holley Raring, in 1-2 weeks   Home Health: No as pt refuses HH and SNF  Equipment/Devices:  Discharge Condition: stable  CODE STATUS: full  Diet recommendation: Heart Healthy / Carb Modified  Brief/Interim Summary: HPI was taken from Dr. Sidney Ace: Kyle Roberson is a 81 y.o. male with medical history significant for coronary artery disease, type 2 diabetes mellitus, GERD, hypertension and dyslipidemia, who presented to the emergency room with acute onset of abdominal distention and weight gain of more than 20 pounds over the last couple of weeks.  The patient has been getting subcutaneous and p.o. Lasix as well as Zaroxolyn.  He has been diuresing however his weight has been increasing.  He has been experiencing worsening dyspnea.  He admits to orthopnea and paroxysmal nocturnal dyspnea as well as dyspnea on exertion with worsening lower extremity edema.  No fever or chills.  No dysuria, oliguria, urinary frequency or urgency or flank pain.  No cough or wheezing.  No nausea or vomiting or diarrhea or abdominal pain.  He is expected to have an echo by [name] soon.   ED Course: When he came to the ER, BP was 153/72 with heart rate of 56 and otherwise normal vital signs.  Labs revealed BUN of 41 and creatinine 2.69 that are fairly stable from previous levels.  BNP was 263.5 and high-sensitivity troponin I was 22 and later 21.  CBC showed hemoglobin of 11.4 hematocrit 37.3 EKG as reviewed by me : EKG showed junctional rhythm with a rate of 57 with right axis deviation and low voltage QRS as well as incomplete right bundle branch  block with Imaging: Two-view chest x-ray showed mild diffuse interstitial infiltrate suspicious for mild residual edema.   The patient was given 80 mg of IV Lasix, 100 mg of p.o. hydralazine, 90 mg of p.o. and urine 2.5 mg of p.o. Eliquis.  She will be admitted to a cardiac telemetry bed for further evaluation and management.  As per Dr. Billie Ruddy: Kyle Roberson is a 81 y.o. male with medical history significant for coronary artery disease, type 2 diabetes mellitus, CKD4, hypertension, who presented to the emergency room with abdominal distention and weight gain of more than 20 pounds over the last couple of weeks.  The patient has been getting subcutaneous and p.o. Lasix as well as Zaroxolyn.  Patient is also experiencing worsening dyspnea, orthopnea and PND.   As per Dr. Jimmye Norman 11/29-12/2/23: Pt was already on IV lasix drip as per cardio by the time I started caring for the pt. Pt diuresis well while on lasix drip which was d/c on 02/20/22 as per cardio. Pt was d/c home on lasix 80mg  BID as per cardio. Pt will f/u outpatient w/ [name] in 2-3 days to further discuss diuretics/CHF. 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 (HCC) Active Problems:   Elevated troponin   Type 2 diabetes mellitus with stage 4 chronic kidney disease, with long-term current use of insulin (HCC)   Paroxysmal atrial fibrillation (HCC)   CKD (chronic kidney disease) stage 4, GFR 15-29  ml/min ([place])   Depression   Essential hypertension   Morbid obesity ([place])   Dyslipidemia   Coronary artery disease involving native coronary artery of native heart without angina pectoris   Acute on chronic heart failure with preserved ejection fraction (HFpEF) (HCC)   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 Monitor I/Os. Repeat echo w/ similar EF, significantly elevated right ventricular pressure and severe pulmonary hypertension. Cardio & nephro following and recs apprec     Acute hypoxic respiratory failure: O2 sats down to 86% on room air while working with PT on 11/22. Weaned off of supplemental oxygen. Resolved     Elevated troponin: likely secondary to demand ischemia    DM2: likely poorly controlled. Continue on SSI w/ accuchecks     CKDIV: Cr is trending down slightly today . Likely secondary to lasix use. Nephro following and recs apprec    PAF: continue on eliquis. Rate controlled   Depression: severity unknown. Continue on home dose of zoloft     Morbid obesity: BMI 35.3. Complicates overall care & prognosis    HTN: continue on imdur, hydralazine, cardura, lasix    HLD: continue on statin    CAD: with remote PCI. Continue on imdur, statin  Discharge Instructions  Discharge Instructions     Diet - low sodium heart healthy   Complete by: As directed    With 1223mL fluid restriction q24hrs   Discharge instructions   Complete by: As directed    F/u w/ cardio, [name], in 2-3 days. Needs BMP to check Cr/GFR in 2-3 days. F/u nephro, Dr. Holley Raring, in 1-2 weeks. F/u w/ PCP in 1-2 weeks   Increase activity slowly   Complete by: As directed       Allergies as of 02/21/2022       Reactions   Atorvastatin Hives, Itching, Other (See Comments)   Other reaction(s): Other (See Comments)   Simvastatin Hives, Itching, Other (See Comments)   Other reaction(s): Other (See Comments)        Medication List     STOP taking these medications    Furoscix 80 MG/10ML Ctkt Generic drug: Furosemide Replaced by: furosemide 80 MG tablet   neomycin-polymyxin-dexameth 0.1 % Oint Commonly known as: MAXITROL   torsemide 20 MG tablet Commonly  known as: DEMADEX       TAKE these medications    dorzolamide-timolol 2-0.5 % ophthalmic solution Commonly known as: COSOPT Place 1 drop into the left eye 2 (two) times daily.   doxazosin 1 MG tablet Commonly known as: Cardura Take 1 tablet (1 mg total) by mouth 2 (two) times daily.   Eliquis 2.5 MG Tabs tablet Generic drug: apixaban TAKE 1 TABLET(2.5 MG) BY MOUTH TWICE DAILY   FISH OIL PO Take 1 capsule by mouth in the morning.   FLAXSEED OIL PO Take 1 capsule by mouth in the morning.   furosemide 80 MG tablet Commonly known as: LASIX Take 1 tablet (80 mg total) by mouth 2 (two) times daily for 14 days. Replaces: Furoscix 80 MG/10ML Ctkt   glipiZIDE 10 MG 24 hr tablet Commonly known as: GLUCOTROL XL TAKE 1 TABLET(10 MG) BY MOUTH TWICE DAILY What changed:  how much to take how to take this  when to take this   hydrALAZINE 100 MG tablet Commonly known as: APRESOLINE 100 mg three times a day, with extra 100 mg as needed for high pressure. What changed: Another medication with the same name was removed. Continue taking this medication, and follow the directions you see here.   insulin NPH Human 100 UNIT/ML injection Commonly known as: NOVOLIN N Inject 8-10 Units into the skin daily before breakfast. Take 10-15 units twice a day after breakfast and before bedtime per Sliding Scale (BS>250: 15 units, BS= 200-250:10 units)   isosorbide mononitrate 30 MG 24 hr tablet Commonly known as: IMDUR Take 1 tablet (30 mg total) by mouth daily. Take 3 tablets (90 mg) by mouth once daily What changed:  how much to take how to take this when to take this   metolazone 5 MG tablet Commonly known as: ZAROXOLYN Take 1 tablet (5 mg) by mouth 2-3 times per week. Take 30 minutes prior to torsemide   multivitamin with minerals Tabs tablet Take 1 tablet by mouth in the morning.   nitroGLYCERIN 0.4 MG/SPRAY spray Commonly known as: NITROLINGUAL Place 1 spray under the tongue as  directed.   potassium chloride SA 20 MEQ tablet Commonly known as: KLOR-CON M Take 1 tablet (20 mEq total) by mouth daily as needed (with metalozone).   QC TUMERIC COMPLEX PO Take 1 capsule by mouth in the morning. Qunol Turmeric Curcumin- 40 MG   QUNOL ULTRA COQ10 PO Take 1 capsule by mouth in the morning.   rosuvastatin 5 MG tablet Commonly known as: CRESTOR Take 1 tablet (5 mg total) by mouth 3 (three) times a week.   sertraline 25 MG tablet Commonly known as: ZOLOFT Take 1 tablet (25 mg total) by mouth daily.   VITAMIN C PO Take 1 tablet by mouth in the morning.   Vitamin D3 1.25 MG (50000 UT) Caps Take 1 tablet by mouth in the morning.        Allergies  Allergen Reactions   Atorvastatin Hives, Itching and Other (See Comments)    Other reaction(s): Other (See Comments)   Simvastatin Hives, Itching and Other (See Comments)    Other reaction(s): Other (See Comments)    Consultations: Cardio Nephro    Procedures/Studies: ECHOCARDIOGRAM COMPLETE  Result Date: 02/09/2022    ECHOCARDIOGRAM REPORT   Patient Name:   ADREN DOLLINS Date of Exam: 02/09/2022 Medical Rec #:  [id number]         Height:       68.0 in Accession #:    [id number]        Weight:       272.0 lb Date of Birth:  [birth year]        BSA:          2.329 m Patient Age:    57 years          BP:           159/75 mmHg Patient Gender: M                 HR:           71 bpm. Exam Location:  [facility] Procedure: 2D Echo, Color Doppler and Cardiac Doppler Indications:     I50.31 congestive heart failure-Acute Diastolic  History:         Patient has prior history of Echocardiogram examinations, most                  recent 05/08/2019. HFpEF; CAD.  Sonographer:  Charmayne Sheer Referring Phys:  [id number] [place] Diagnosing Phys: Kathlyn Sacramento MD  Sonographer Comments: Suboptimal subcostal window. IMPRESSIONS  1. Left ventricular ejection fraction, by estimation, is 55 to 60%. The left ventricle has normal function. The  left ventricle has no regional wall motion abnormalities. The left ventricular internal cavity size was mildly dilated. There is mild left ventricular hypertrophy. Left ventricular diastolic parameters are indeterminate. There is the interventricular septum is flattened in systole, consistent with right ventricular pressure overload.  2. Right ventricular systolic function is normal. The right ventricular size is mildly enlarged. Mildly increased right ventricular wall thickness. There is severely elevated pulmonary artery systolic pressure. The estimated right ventricular systolic pressure is 25.6 mmHg.  3. Left atrial size was mildly dilated.  4. Right atrial size was mildly dilated.  5. The mitral valve is normal in structure. Mild mitral valve regurgitation. No evidence of mitral stenosis.  6. Tricuspid valve regurgitation is moderate.  7. The aortic valve is calcified. Aortic valve regurgitation is mild. Mild aortic valve stenosis. Aortic valve area, by VTI measures 1.45 cm. Aortic valve mean gradient measures 7.0 mmHg.  8. Moderately dilated pulmonary artery.  9. The inferior vena cava is dilated in size with <50% respiratory variability, suggesting right atrial pressure of 15 mmHg. FINDINGS  Left Ventricle: Left ventricular ejection fraction, by estimation, is 55 to 60%. The left ventricle has normal function. The left ventricle has no regional wall motion abnormalities. The left ventricular internal cavity size was mildly dilated. There is  mild left ventricular hypertrophy. The interventricular septum is flattened in systole, consistent with right ventricular pressure overload. Left ventricular diastolic parameters are indeterminate. Right Ventricle: The right ventricular size is mildly enlarged. Mildly increased right ventricular wall thickness. Right ventricular systolic function is normal. There is severely elevated pulmonary artery systolic pressure. The tricuspid regurgitant velocity is 4.11 m/s, and  with an assumed right atrial pressure of 15 mmHg, the estimated right ventricular systolic pressure is 38.9 mmHg. Left Atrium: Left atrial size was mildly dilated. Right Atrium: Right atrial size was mildly dilated. Pericardium: There is no evidence of pericardial effusion. Mitral Valve: The mitral valve is normal in structure. Mild mitral valve regurgitation. No evidence of mitral valve stenosis. Tricuspid Valve: The tricuspid valve is normal in structure. Tricuspid valve regurgitation is moderate . No evidence of tricuspid stenosis. Aortic Valve: The aortic valve is calcified. Aortic valve regurgitation is mild. Aortic regurgitation PHT measures 485 msec. Mild aortic stenosis is present. Aortic valve mean gradient measures 7.0 mmHg. Aortic valve peak gradient measures 12.7 mmHg. Aortic valve area, by VTI measures 1.45 cm. Pulmonic Valve: The pulmonic valve was normal in structure. Pulmonic valve regurgitation is not visualized. No evidence of pulmonic stenosis. Aorta: The aortic root is normal in size and structure. Pulmonary Artery: The pulmonary artery is moderately dilated. Venous: The inferior vena cava is dilated in size with less than 50% respiratory variability, suggesting right atrial pressure of 15 mmHg. IAS/Shunts: No atrial level shunt detected by color flow Doppler.  LEFT VENTRICLE PLAX 2D LVIDd:         5.40 cm   Diastology LVIDs:         3.70 cm   LV e' medial:    8.27 cm/s LV PW:         1.30 cm   LV E/e' medial:  15.7 LV IVS:        0.90 cm   LV e' lateral:   12.00 cm/s LVOT  diam:     1.80 cm   LV E/e' lateral: 10.8 LV SV:         52 LV SV Index:   22 LVOT Area:     2.54 cm  RIGHT VENTRICLE RV Basal diam:  4.30 cm RV S prime:     12.10 cm/s LEFT ATRIUM             Index        RIGHT ATRIUM           Index LA diam:        4.30 cm 1.85 cm/m   RA Area:     22.40 cm LA Vol (A2C):   81.9 ml 35.16 ml/m  RA Volume:   64.10 ml  27.52 ml/m LA Vol (A4C):   56.9 ml 24.43 ml/m LA Biplane Vol: 74.1 ml  31.82 ml/m  AORTIC VALVE                     PULMONIC VALVE AV Area (Vmax):    1.46 cm      PV Vmax:       1.13 m/s AV Area (Vmean):   1.46 cm      PV Vmean:      72.900 cm/s AV Area (VTI):     1.45 cm      PV VTI:        0.208 m AV Vmax:           178.00 cm/s   PV Peak grad:  5.1 mmHg AV Vmean:          127.000 cm/s  PV Mean grad:  2.0 mmHg AV VTI:            0.358 m AV Peak Grad:      12.7 mmHg AV Mean Grad:      7.0 mmHg LVOT Vmax:         102.00 cm/s LVOT Vmean:        73.000 cm/s LVOT VTI:          0.204 m LVOT/AV VTI ratio: 0.57 AI PHT:            485 msec  AORTA Ao Root diam: 3.30 cm MITRAL VALVE                TRICUSPID VALVE MV Area (PHT): 3.60 cm     TR Peak grad:   67.6 mmHg MV Decel Time: 211 msec     TR Vmax:        411.00 cm/s MV E velocity: 130.00 cm/s                             SHUNTS                             Systemic VTI:  0.20 m                             Systemic Diam: 1.80 cm Kathlyn Sacramento MD Electronically signed by Kathlyn Sacramento MD Signature Date/Time: 02/09/2022/6:10:25 PM    Final    US Abdomen Limited  Result Date: 02/09/2022 CLINICAL DATA:  Evaluate for ascites EXAM: LIMITED ABDOMEN ULTRASOUND FOR ASCITES TECHNIQUE: Limited ultrasound survey for ascites was performed in all four abdominal quadrants. COMPARISON:  None Available. FINDINGS: No ascites seen within the abdomen.  Right pleural effusion noted. IMPRESSION: No visible ascites. Right pleural effusion. Electronically Signed   By: Rolm Baptise M.D.   On: 02/09/2022 01:27   DG Chest 2 View  Result Date: 02/08/2022 CLINICAL DATA:  Shortness of breath.  Peripheral edema. EXAM: CHEST - 2 VIEW COMPARISON:  04/29/2019 FINDINGS: Stable mild cardiomegaly. Diffuse interstitial infiltrates are suspicious for mild interstitial edema. No evidence of pulmonary consolidation or pleural effusion. IMPRESSION: Mild diffuse interstitial infiltrates, suspicious for mild interstitial edema. Electronically Signed   By: Marlaine Hind  M.D.   On: 02/08/2022 19:37   (Echo, Carotid, EGD, Colonoscopy, ERCP)    Subjective: Pt c/o fatigue    Discharge Exam: Vitals:   02/21/22 0020 02/21/22 0421  BP: (!) 133/58 (!) 138/56  Pulse: (!) 47 (!) 51  Resp: 19 20  Temp: 98.4 F (36.9 C) 97.7 F (36.5 C)  SpO2: 96% 93%   Vitals:   02/20/22 2001 02/21/22 0020 02/21/22 0421 02/21/22 0500  BP: (!) 143/58 (!) 133/58 (!) 138/56   Pulse: (!) 49 (!) 47 (!) 51   Resp: 17 19 20    Temp: 97.6 F (36.4 C) 98.4 F (36.9 C) 97.7 F (36.5 C)   TempSrc: Oral Oral Oral   SpO2: 96% 96% 93%   Weight:    75.2 kg  Height:        General: Pt is alert, awake, not in acute distress Cardiovascular: S1/S2 +, no rubs, no gallops Respiratory: decreased breath sounds b/l otherwise clear  Abdominal: Soft, NT, ND, bowel sounds + Extremities: b/l LE edema, no cyanosis    The results of significant diagnostics from this hospitalization (including imaging, microbiology, ancillary and laboratory) are listed below for reference.     Microbiology: No results found for this or any previous visit (from the past 240 hour(s)).   Labs: BNP (last 3 results) Recent Labs    08/25/21 1751 12/02/21 1657 02/08/22 1919  BNP 185.6* 260.5* 654.6*   Basic Metabolic Panel: Recent Labs  Lab 02/15/22 1227 02/16/22 0457 02/17/22 0616 02/18/22 0700 02/19/22 0614 02/20/22 0537 02/21/22 0518  NA 139 137 137 140 141 139 139  K 3.9 3.4* 3.7 3.6 3.3* 3.7 3.5  CL 90* 89* 90* 91* 94* 93* 94*  CO2 34* 34* 36* 37* 37* 34* 34*  GLUCOSE 281* 211* 207* 205* 110* 194* 194*  BUN 82* 85* 92* 96* 97* 95* 97*  CREATININE 2.92* 2.82* 2.72* 2.90* 2.95* 2.97* 2.90*  CALCIUM 9.1 8.9 8.8* 9.2 9.1 9.1 9.2  MG 2.4 2.4 2.5* 2.5* 2.5*  --   --    Liver Function Tests: No results for input(s): "AST", "ALT", "ALKPHOS", "BILITOT", "PROT", "ALBUMIN" in the last 168 hours. No results for input(s): "LIPASE", "AMYLASE" in the last 168 hours. No results for input(s):  "AMMONIA" in the last 168 hours. CBC: Recent Labs  Lab 02/17/22 0616 02/18/22 0700 02/19/22 0614 02/20/22 0537 02/21/22 0518  WBC 6.2 6.7 8.3 7.7 8.8  HGB 10.9* 10.7* 10.9* 10.7* 11.0*  HCT 33.8* 35.0* 35.0* 33.2* 34.6*  MCV 91.1 91.9 91.6 90.7 91.3  PLT 179 169 183 176 169   Cardiac Enzymes: No results for input(s): "CKTOTAL", "CKMB", "CKMBINDEX", "TROPONINI" in the last 168 hours. BNP: Invalid input(s): "POCBNP" CBG: Recent Labs  Lab 02/20/22 1142 02/20/22 1543 02/20/22 2104 02/21/22 0018 02/21/22 1220  GLUCAP 203* 258* 269* 228* 240*   D-Dimer No results for input(s): "DDIMER" in the last 72 hours. Hgb A1c No results for input(s): "HGBA1C" in the  last 72 hours. Lipid Profile No results for input(s): "CHOL", "HDL", "LDLCALC", "TRIG", "CHOLHDL", "LDLDIRECT" in the last 72 hours. Thyroid function studies No results for input(s): "TSH", "T4TOTAL", "T3FREE", "THYROIDAB" in the last 72 hours.  Invalid input(s): "FREET3" Anemia work up No results for input(s): "VITAMINB12", "FOLATE", "FERRITIN", "TIBC", "IRON", "RETICCTPCT" in the last 72 hours. Urinalysis    Component Value Date/Time   COLORURINE YELLOW (A) 02/04/2021 1358   APPEARANCEUR CLEAR (A) 02/04/2021 1358   LABSPEC 1.014 02/04/2021 1358   PHURINE 5.0 02/04/2021 1358   GLUCOSEU NEGATIVE 02/04/2021 1358   HGBUR NEGATIVE 02/04/2021 1358   BILIRUBINUR NEGATIVE 02/04/2021 1358   BILIRUBINUR neg 01/10/2019 1628   KETONESUR NEGATIVE 02/04/2021 1358   PROTEINUR 100 (A) 02/04/2021 1358   UROBILINOGEN 0.2 01/10/2019 1628   NITRITE NEGATIVE 02/04/2021 1358   LEUKOCYTESUR NEGATIVE 02/04/2021 1358   Sepsis Labs Recent Labs  Lab 02/18/22 0700 02/19/22 0614 02/20/22 0537 02/21/22 0518  WBC 6.7 8.3 7.7 8.8   Microbiology No results found for this or any previous visit (from the past 240 hour(s)).   Time coordinating discharge: Over 30 minutes  SIGNED:   Wyvonnia Dusky, MD  [facility] 02/21/2022, 1:09 PM Pager   If 7PM-7AM, please contact night-coverage [email or web address]

## 2022-02-21 NOTE — Progress Notes (Incomplete)
1315: pt notified of d/c order. Asked to notify his partner and let us know what time she would be here for pickup  1458: Massie Kluver called to directly facilitate d/c; unable to leave VM as VM full  1557: Massie Kluver called @ [phone]. Sent to VM after two rings  1614: Pt asked about ETA of his ride. Pt states, "she should be on her way"

## 2022-02-23 ENCOUNTER — Telehealth: Payer: Self-pay

## 2022-02-23 NOTE — Telephone Encounter (Signed)
Transition Care Management Follow-up Telephone Call Date of discharge and from where: [place] 02/21/2022 How have you been since you were released from the hospital? weak Any questions or concerns? No  Items Reviewed: Did the pt receive and understand the discharge instructions provided? Yes  Medications obtained and verified? Yes  Other? No  Any new allergies since your discharge? No  Dietary orders reviewed? Yes Do you have support at home? Yes   Home Care and Equipment/Supplies: Were home health services ordered? no If so, what is the name of the agency? N/A  Has the agency set up a time to come to the patient's home? not applicable Were any new equipment or medical supplies ordered?  No What is the name of the medical supply agency? N/A Were you able to get the supplies/equipment? no Do you have any questions related to the use of the equipment or supplies? No  Functional Questionnaire: (I = Independent and D = Dependent) ADLs: D  Bathing/Dressing- D  Meal Prep- D  Eating- I  Maintaining continence- I  Transferring/Ambulation- I  Managing Meds- D  Follow up appointments reviewed:  PCP Hospital f/u appt confirmed? Yes  Scheduled to see Dr Army Melia on 03/02/2022 @ 3:20. [facility] f/u appt confirmed? Yes  Scheduled to see Dr Ocie Doyne 03/02/2022 @ 1:20. Are transportation arrangements needed? No  If their condition worsens, is the pt aware to call PCP or go to the Emergency Dept.? Yes Was the patient provided with contact information for the PCP's office or ED? Yes Was to pt encouraged to call back with questions or concerns? Yes  .lh

## 2022-02-26 ENCOUNTER — Other Ambulatory Visit (HOSPITAL_COMMUNITY): Payer: Self-pay

## 2022-02-26 ENCOUNTER — Other Ambulatory Visit: Payer: Medicare HMO

## 2022-03-02 ENCOUNTER — Inpatient Hospital Stay: Payer: Medicare HMO | Admitting: Internal Medicine

## 2022-03-02 ENCOUNTER — Encounter: Payer: Medicare HMO | Admitting: Internal Medicine

## 2022-03-05 ENCOUNTER — Ambulatory Visit: Payer: Medicare HMO | Attending: Internal Medicine | Admitting: Internal Medicine

## 2022-03-05 ENCOUNTER — Encounter: Payer: Medicare HMO | Admitting: Internal Medicine

## 2022-03-05 VITALS — BP 142/46 | HR 51 | Wt 224.2 lb

## 2022-03-05 DIAGNOSIS — Z79899 Other long term (current) drug therapy: Secondary | ICD-10-CM | POA: Insufficient documentation

## 2022-03-05 DIAGNOSIS — I5033 Acute on chronic diastolic (congestive) heart failure: Secondary | ICD-10-CM | POA: Diagnosis present

## 2022-03-05 DIAGNOSIS — I2721 Secondary pulmonary arterial hypertension: Secondary | ICD-10-CM | POA: Insufficient documentation

## 2022-03-05 DIAGNOSIS — I5032 Chronic diastolic (congestive) heart failure: Secondary | ICD-10-CM

## 2022-03-05 DIAGNOSIS — E1122 Type 2 diabetes mellitus with diabetic chronic kidney disease: Secondary | ICD-10-CM | POA: Diagnosis not present

## 2022-03-05 DIAGNOSIS — N184 Chronic kidney disease, stage 4 (severe): Secondary | ICD-10-CM | POA: Diagnosis not present

## 2022-03-05 DIAGNOSIS — Z7901 Long term (current) use of anticoagulants: Secondary | ICD-10-CM | POA: Diagnosis not present

## 2022-03-05 DIAGNOSIS — I130 Hypertensive heart and chronic kidney disease with heart failure and stage 1 through stage 4 chronic kidney disease, or unspecified chronic kidney disease: Secondary | ICD-10-CM | POA: Diagnosis present

## 2022-03-05 DIAGNOSIS — I4821 Permanent atrial fibrillation: Secondary | ICD-10-CM | POA: Diagnosis not present

## 2022-03-05 DIAGNOSIS — Z955 Presence of coronary angioplasty implant and graft: Secondary | ICD-10-CM | POA: Diagnosis present

## 2022-03-05 DIAGNOSIS — E785 Hyperlipidemia, unspecified: Secondary | ICD-10-CM

## 2022-03-05 DIAGNOSIS — I2510 Atherosclerotic heart disease of native coronary artery without angina pectoris: Secondary | ICD-10-CM

## 2022-03-05 DIAGNOSIS — E1169 Type 2 diabetes mellitus with other specified complication: Secondary | ICD-10-CM

## 2022-03-05 LAB — BASIC METABOLIC PANEL
Anion gap: 11 (ref 5–15)
BUN: 56 mg/dL — ABNORMAL HIGH (ref 8–23)
CO2: 27 mmol/L (ref 22–32)
Calcium: 9.2 mg/dL (ref 8.9–10.3)
Chloride: 102 mmol/L (ref 98–111)
Creatinine, Ser: 2.71 mg/dL — ABNORMAL HIGH (ref 0.61–1.24)
GFR, Estimated: 23 mL/min — ABNORMAL LOW (ref >60)
Glucose, Bld: 175 mg/dL — ABNORMAL HIGH (ref 70–99)
Potassium: 4.2 mmol/L (ref 3.5–5.1)
Sodium: 140 mmol/L (ref 135–145)

## 2022-03-05 LAB — BRAIN NATRIURETIC PEPTIDE: B Natriuretic Peptide: 208.1 pg/mL — ABNORMAL HIGH (ref 0.0–100.0)

## 2022-03-05 MED ORDER — ROSUVASTATIN CALCIUM 10 MG PO TABS: 10.0000 mg | ORAL_TABLET | ORAL | 3 refills | Status: DC

## 2022-03-05 MED ORDER — FUROSCIX 80 MG/10ML ~~LOC~~ CTKT: 80.0000 mg | CARTRIDGE | SUBCUTANEOUS | 0 refills | Status: DC

## 2022-03-05 MED ORDER — FUROSEMIDE 40 MG PO TABS: 40.0000 mg | ORAL_TABLET | Freq: Every day | ORAL | 6 refills | Status: DC | PRN

## 2022-03-05 MED ORDER — FUROSEMIDE 40 MG PO TABS: 40.0000 mg | ORAL_TABLET | Freq: Every day | ORAL | 0 refills | Status: DC | PRN

## 2022-03-05 NOTE — Progress Notes (Signed)
ADVANCED HF CLINIC NOTE  ACZ:YSAYTKZS, Jesse Sans, MD Primary Cardiologist: Ida Rogue, MD   HPI:  Kyle Roberson is an 81 y.o. 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   Presented to ED for for evaluation on 11/19. BNP 263, Scr 2.69 (baseline Scr had been variable, ranging 2-2.5), . HS troponin 22>21. CXR with evidence of CHF. He was given IV lasix and admitted for acute on chronic CHF. Cardiology consulted. He has been diuresing with lasix gtt and metolazone, currently negative 15L since admit. Scr trended up to 3.06 several days ago >> now improved to 2.72. Lasix gtt had been cut back to 4 mg/hr with decrease in UOP, lasix gtt subsequently increased back to 8 mg/hr this am. Remains persistently volume overloaded. Nephrology and Advanced Heart Failure asked to evaluate patient.   Echo 11/23 EF 55-60% RV moderately dilated/moderate HK. Mild AS RVSP 30mmHG. Has never had RHC   Was admitted in 11/23 with massive volume overloaded. Diuresed 50 pounds. D/c weight 220.   Here significant other ([name]). Has been able to keep weight off. Weight stable at home at 220  Says he is doing pretty good. Feels breathing is better. Sats in mid 90s. Sleeps in a recliner. No PND. No CP. Weighing daily. Taking lasix 80 daily and weight stable. SCr was 2.9 at d/c SBP mostly 130s at home    ROS: All systems negative except as listed in HPI, PMH and Problem List.  SH:  Social History   Socioeconomic History   Marital status: Married    Spouse name: Not on file   Number of children: Not on file   Years of education: Not on file   Highest education level: Not on file  Occupational History   Not on file  Tobacco Use   Smoking status: Former   Smokeless tobacco: Never  Vaping Use   Vaping Use: Never used  Substance and Sexual Activity   Alcohol use: Not Currently     Alcohol/week: 2.0 standard drinks of alcohol    Types: 2 Glasses of wine per week   Drug use: Not Currently   Sexual activity: Not on file  Other Topics Concern   Not on file  Social History Narrative   Lives at home with wife   Social Determinants of Health   Financial Resource Strain: Not on file  Food Insecurity: No Food Insecurity (02/11/2022)   Hunger Vital Sign    Worried About Running Out of Food in the Last Year: Never true    Ran Out of Food in the Last Year: Never true  Transportation Needs: No Transportation Needs (02/11/2022)   PRAPARE - Hydrologist (Medical): No    Lack of Transportation (Non-Medical): No  Physical Activity: Not on file  Stress: Not on file  Social Connections: Not on file  Intimate Partner Violence: Not At Risk (02/11/2022)   Humiliation, Afraid, Rape, and Kick questionnaire    Fear of Current or Ex-Partner: No    Emotionally Abused: No    Physically Abused: No    Sexually Abused: No    FH:  Family History  Problem Relation Age of Onset   Pancreatic cancer Mother    CAD Father    Diabetes Brother     Past Medical History:  Diagnosis Date   AKI (acute kidney injury) ([address]) 07/06/2018   CAD (  coronary artery disease)    a. Remote PCI/stenting to LAD w PTCA Diagnoal. RCA 60%; b.  2005/2008 Cardiolites w/ reportedly mild ischemia in Diag territory-->Med rx.   Cellulitis of lower extremity 05/31/2019   Chronic heart failure with preserved ejection fraction (HFpEF) ([place])    a. 04/2019 Echo: EF 55-60%, no rwma, nl RV fxn, RVSP 55.54mmHg. Mod dil LA. Mild-mod MR. Mod dil PA.   CKD (chronic kidney disease), stage IV (HCC)    Diabetes (HCC)    GERD (gastroesophageal reflux disease)    History of MI (myocardial infarction) 06/27/2014   HLD (hyperlipidemia)    HTN (hypertension)    PAH (pulmonary artery hypertension) ([place])    a. 04/2019 Echo: RVSP 55.67mmHg.   Permanent atrial fibrillation (HCC)    a. CHA2DS2VASc =  5-->dose adjusted eliquis (age/creat).   Subdural hematoma ([place])    a. 01/2021 in setting of fall s/p L frontotemporal craniotomy.    Current Outpatient Medications  Medication Sig Dispense Refill   apixaban (ELIQUIS) 2.5 MG TABS tablet TAKE 1 TABLET(2.5 MG) BY MOUTH TWICE DAILY 180 tablet 1   Ascorbic Acid (VITAMIN C PO) Take 1 tablet by mouth in the morning.     Cholecalciferol (VITAMIN D3) 1.25 MG (50000 UT) CAPS Take 1 tablet by mouth in the morning.     Coenzyme Q10-Vitamin E (QUNOL ULTRA COQ10 PO) Take 1 capsule by mouth in the morning.     dorzolamide-timolol (COSOPT) 22.3-6.8 MG/ML ophthalmic solution Place 1 drop into the left eye 2 (two) times daily.     doxazosin (CARDURA) 1 MG tablet Take 1 tablet (1 mg total) by mouth 2 (two) times daily. 180 tablet 3   Flaxseed, Linseed, (FLAXSEED OIL PO) Take 1 capsule by mouth in the morning.     furosemide (LASIX) 80 MG tablet Take 80 mg by mouth daily.     glipiZIDE (GLUCOTROL XL) 10 MG 24 hr tablet Take 10 mg by mouth daily with breakfast.     hydrALAZINE (APRESOLINE) 100 MG tablet 100 mg three times a day, with extra 100 mg as needed for high pressure. 360 tablet 3   isosorbide mononitrate (IMDUR) 30 MG 24 hr tablet Take 30 mg by mouth in the morning, at noon, and at bedtime.     Multiple Vitamin (MULTIVITAMIN WITH MINERALS) TABS tablet Take 1 tablet by mouth in the morning.     nitroGLYCERIN (NITROLINGUAL) 0.4 MG/SPRAY spray Place 1 spray under the tongue as directed. 12 g 0   Omega-3 Fatty Acids (FISH OIL PO) Take 1 capsule by mouth in the morning.     potassium chloride SA (KLOR-CON M) 20 MEQ tablet Take 20 mEq by mouth once a week. 2-3 times a week     rosuvastatin (CRESTOR) 5 MG tablet Take 1 tablet (5 mg total) by mouth 3 (three) times a week. 38 tablet 1   Turmeric (QC TUMERIC COMPLEX PO) Take 1 capsule by mouth in the morning. Qunol Turmeric Curcumin- 40 MG     No current facility-administered medications for this visit.     Vitals:   03/05/22 1134  BP: (!) 142/46  Pulse: (!) 51  SpO2: 100%  Weight: 224 lb 4 oz (101.7 kg)    PHYSICAL EXAM:  General:  Sitting in chair No resp difficulty HEENT: normal x sclerae injected Neck: supple. JVP flat. Carotids 2+ bilaterally; no bruits. No lymphadenopathy or thryomegaly appreciated. Cor: PMI normal. Irregular rate & rhythm. No rubs, gallops or murmurs. Lungs: clear decreased throughout  Abdomen: obese soft, nontender, nondistended. No hepatosplenomegaly. No bruits or masses. Good bowel sounds. Extremities: no cyanosis, clubbing, rash, mildchronic woody edema Neuro: alert & orientedx3, cranial nerves grossly intact. Moves all 4 extremities w/o difficulty. Affect pleasant.   ASSESSMENT & PLAN:  Acute on chronic diastolic CHF with prominent R-sided HF:  Echo 02/21: EF 55-60%, RV okay, RVSP 56 mmHg, dilated pulmonary artery, dilated IVC -Echo 11/23: EF 55-60%, interventricular septum flattened in systole consistent with RV pressure overload, RV function okay, RVSP severely elevated 83 mmHg, mild MR, moderate TR, dilated pulmonary artery, dilated IVC - Volume status much improved with recent diureis.  - GDMT and tight volume control limited by renal impairment - Continue lasix 80 daily. Can drop to 40 if feeling too dry. Watch for fluid overload. Needs Furoscix for breakthrough periods - Avoiding SGLT2i with obesity and poor hygiene - Continue hydralazine 100 mg TID + imdur 30 daily - Labs today   2. 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 Improved with diuresis - consider sleep study/ONOX  - doubt he will comply with CPAP but may benefit from nocturnal O2 - I do not think RHC will change  our management at this point   3. CAD: -Hx remote stenting to LAD and diagonal - No s/s angina - Not candidate for cath with CKD IV -Continue statin -No aspirin since anticoagulated   4. Permanent atrial fibrillation: -Rate 50s without rate control meds -On Eliquis 2.5 mg BID (appropriate dose for age and renal function)   5. CKD IV: -Scr variable, has been averaging 2-2.5. New baseline 2.5-3.0  -Suspect cardiorenal -Nephrology following as outpatient -Labs today   6. HTN: -Options limited by renal impairment -With CKD IV would not push BP < 120  Glori Bickers, MD  6:33 AM

## 2022-03-05 NOTE — Patient Instructions (Signed)
Medication Changes:  Continue Furosemide 80 mg daily, we have also sent you in a prescription for 40 mg tabs as needed  We have sent in a refill for Furoscix  Increase Crestor to 10 mg three times a week  Lab Work:  Labs done today, your results will be available in MyChart, we will contact you for abnormal readings.  Testing/Procedures:  none  Referrals:  none  Special Instructions // Education:  Do the following things EVERYDAY: Weigh yourself in the morning before breakfast. Write it down and keep it in a log. Take your medicines as prescribed Eat low salt foods--Limit salt (sodium) to 2000 mg per day.  Stay as active as you can everyday Limit all fluids for the day to less than 2 liters   Follow-Up in: 1-2 months    If you have any questions or concerns before your next appointment please send [place] a message through mychart or call our office at [phone] Monday-Friday 8 am-5 pm.   If you have an urgent need after hours on the weekend please call your Primary Cardiologist or the [facility] in [place] at [phone].

## 2022-03-12 ENCOUNTER — Other Ambulatory Visit: Payer: Self-pay

## 2022-03-12 MED ORDER — DOXAZOSIN MESYLATE 1 MG PO TABS: 1.0000 mg | ORAL_TABLET | Freq: Two times a day (BID) | ORAL | 0 refills | Status: DC

## 2022-03-16 ENCOUNTER — Emergency Department
Admission: EM | Admit: 2022-03-16 | Discharge: 2022-03-16 | Disposition: A | Discharge status: Home / Self Care | Payer: Medicare HMO | Attending: Emergency Medicine | Admitting: Emergency Medicine

## 2022-03-16 ENCOUNTER — Other Ambulatory Visit: Payer: Self-pay

## 2022-03-16 DIAGNOSIS — B029 Zoster without complications: Secondary | ICD-10-CM | POA: Diagnosis not present

## 2022-03-16 DIAGNOSIS — R21 Rash and other nonspecific skin eruption: Secondary | ICD-10-CM | POA: Diagnosis present

## 2022-03-16 LAB — CBC WITH DIFFERENTIAL/PLATELET
Abs Immature Granulocytes: 0.02 10*3/uL (ref 0.00–0.07)
Basophils Absolute: 0 10*3/uL (ref 0.0–0.1)
Basophils Relative: 1 %
Eosinophils Absolute: 0.4 10*3/uL (ref 0.0–0.5)
Eosinophils Relative: 7 %
HCT: 39.8 % (ref 39.0–52.0)
Hemoglobin: 12.5 g/dL — ABNORMAL LOW (ref 13.0–17.0)
Immature Granulocytes: 0 %
Lymphocytes Relative: 18 %
Lymphs Abs: 1 10*3/uL (ref 0.7–4.0)
MCH: 29.4 pg (ref 26.0–34.0)
MCHC: 31.4 g/dL (ref 30.0–36.0)
MCV: 93.6 fL (ref 80.0–100.0)
Monocytes Absolute: 0.9 10*3/uL (ref 0.1–1.0)
Monocytes Relative: 16 %
Neutro Abs: 3.3 10*3/uL (ref 1.7–7.7)
Neutrophils Relative %: 58 %
Platelets: 153 10*3/uL (ref 150–400)
RBC: 4.25 MIL/uL (ref 4.22–5.81)
RDW: 15.6 % — ABNORMAL HIGH (ref 11.5–15.5)
WBC: 5.7 10*3/uL (ref 4.0–10.5)
nRBC: 0 % (ref 0.0–0.2)

## 2022-03-16 LAB — COMPREHENSIVE METABOLIC PANEL
ALT: 28 U/L (ref 0–44)
AST: 25 U/L (ref 15–41)
Albumin: 3.8 g/dL (ref 3.5–5.0)
Alkaline Phosphatase: 72 U/L (ref 38–126)
Anion gap: 11 (ref 5–15)
BUN: 53 mg/dL — ABNORMAL HIGH (ref 8–23)
CO2: 26 mmol/L (ref 22–32)
Calcium: 9.2 mg/dL (ref 8.9–10.3)
Chloride: 102 mmol/L (ref 98–111)
Creatinine, Ser: 2.79 mg/dL — ABNORMAL HIGH (ref 0.61–1.24)
GFR, Estimated: 22 mL/min — ABNORMAL LOW (ref >60)
Glucose, Bld: 147 mg/dL — ABNORMAL HIGH (ref 70–99)
Potassium: 4.5 mmol/L (ref 3.5–5.1)
Sodium: 139 mmol/L (ref 135–145)
Total Bilirubin: 1.1 mg/dL (ref 0.3–1.2)
Total Protein: 7.3 g/dL (ref 6.5–8.1)

## 2022-03-16 MED ORDER — CEPHALEXIN 500 MG PO CAPS: 1000.0000 mg | ORAL_CAPSULE | Freq: Two times a day (BID) | ORAL | 0 refills | Status: DC

## 2022-03-16 MED ORDER — KETOROLAC TROMETHAMINE 0.5 % OP SOLN: 1.0000 [drp] | Freq: Four times a day (QID) | OPHTHALMIC | 0 refills | Status: DC

## 2022-03-16 MED ORDER — OXYCODONE-ACETAMINOPHEN 5-325 MG PO TABS: 1.0000 | ORAL_TABLET | Freq: Four times a day (QID) | ORAL | 0 refills | Status: DC | PRN

## 2022-03-16 MED ORDER — VALACYCLOVIR HCL 1 G PO TABS: 1000.0000 mg | ORAL_TABLET | Freq: Three times a day (TID) | ORAL | 0 refills | Status: DC

## 2022-03-16 NOTE — ED Notes (Signed)
Dr. Jacelyn Grip notified of pt. Orders obtained for blood work from Dr. Haze Boyden

## 2022-03-16 NOTE — ED Triage Notes (Signed)
Pt states he had a plate put in on the left side of the head 13 months ago. Pt states the rash and bumps started this past week. Pt denies current pain and denies itchiness to the area.  Red rash with scabs noted to the left forehead area

## 2022-03-16 NOTE — ED Provider Notes (Signed)
[facility] Provider Note  Patient Contact: 5:02 PM (approximate)   History   Rash   HPI  Kyle Roberson is a 81 y.o. male who presents to the emergency department complaining of rash to the left side of the face.  This involves the forehead, stops midline, involves the nose and the left cheek.  Does not involve the lower part of the face.  Patient had a prodrome of intense pain prior to the onset of rash, then had a vesicular rash that is currently oozing.  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  They want to ensure there is no complications there.  No headache, vision changes.  Patient is blind in the left eye, is on antibiotic drops for the eye every day.     Physical Exam   Triage Vital Signs: ED Triage Vitals  Enc Vitals Group     BP 03/16/22 1543 (!) 169/78     Pulse Rate 03/16/22 1543 65     Resp 03/16/22 1543 19     Temp 03/16/22 1543 97.9 F (36.6 C)     Temp src --      SpO2 03/16/22 1543 99 %     Weight 03/16/22 1544 220 lb (99.8 kg)     Height 03/16/22 1544 5\' 8"  (1.727 m)     Head Circumference --      Peak Flow --      Pain Score 03/16/22 1542 0     Pain Loc --      Pain Edu? --      Excl. in [place]? --     Most recent vital signs: Vitals:   03/16/22 1543  BP: (!) 169/78  Pulse: 65  Resp: 19  Temp: 97.9 F (36.6 C)  SpO2: 99%     General: Alert and in no acute distress. Eyes:  PERRL. EOMI. facial rash surrounding left eye.  Patient has complications with the left eye and other than some increased tearing there is no acute changes to the visual appearance of the eye. Head: No acute traumatic findings.  Patient with vesicular rash consistent with shingles.  Positive Hutchinson sign.  No evidence of cellulitis.  Neck: No stridor. No cervical spine tenderness to palpation.  Cardiovascular:  Good peripheral perfusion Respiratory: Normal respiratory effort without  tachypnea or retractions. Lungs CTAB.  Musculoskeletal: Full range of motion to all extremities.  Neurologic:  No gross focal neurologic deficits are appreciated.  Skin:   No rash noted Other:   ED Results / Procedures / Treatments   Labs (all labs ordered are listed, but only abnormal results are displayed) Labs Reviewed  CBC WITH DIFFERENTIAL/PLATELET - Abnormal; Notable for the following components:      Result Value   Hemoglobin 12.5 (*)    RDW 15.6 (*)    All other components within normal limits  COMPREHENSIVE METABOLIC PANEL - Abnormal; Notable for the following components:   Glucose, Bld 147 (*)    BUN 53 (*)    Creatinine, Ser 2.79 (*)    GFR, Estimated 22 (*)    All other components within normal limits     EKG     RADIOLOGY    No results found.  PROCEDURES:  Critical Care performed: No  Procedures   MEDICATIONS ORDERED IN ED: Medications - No data to display   IMPRESSION / MDM / [place] / ED COURSE  I reviewed the  triage vital signs and the nursing notes.                              Differential diagnosis includes, but is not limited to, facial cellulitis, shingles  Patient's presentation is most consistent with acute presentation with potential threat to life or bodily function.   Patient's diagnosis is consistent with shingles.  Patient presents emergency department findings consistent with shingles to the left side of the face.  Patiently placed on antivirals, antibiotics prophylactically as he has underlying hardware in his skull.  Patient will have drops for the eye to keep irritation to a minimum.  Follow-up precautions discussed with the patient and family.  Follow-up primary care as needed..  Patient is given ED precautions to return to the ED for any worsening or new symptoms.        FINAL CLINICAL IMPRESSION(S) / ED DIAGNOSES   Final diagnoses:  Herpes zoster without complication     Rx / DC Orders   ED  Discharge Orders          Ordered    valACYclovir (VALTREX) 1000 MG tablet  3 times daily        03/16/22 1725    cephALEXin (KEFLEX) 500 MG capsule  2 times daily        03/16/22 1725    oxyCODONE-acetaminophen (PERCOCET/ROXICET) 5-325 MG tablet  Every 6 hours PRN        03/16/22 1725    ketorolac (ACULAR) 0.5 % ophthalmic solution  4 times daily        03/16/22 1725             Note:  This document was prepared using Dragon voice recognition software and may include unintentional dictation errors.   Brynda Peon 03/16/22 1734    Vanessa Trafalgar, MD 03/16/22 2121

## 2022-03-17 ENCOUNTER — Telehealth: Payer: Self-pay | Admitting: *Deleted

## 2022-03-17 NOTE — Telephone Encounter (Signed)
Transition Care Management Follow-up Telephone Call Date of discharge and from where: 03/16/2022 [facility] ER How have you been since you were released from the hospital? Doing better  Any questions or concerns? No  Items Reviewed: Did the pt receive and understand the discharge instructions provided? Yes  Medications obtained and verified? Yes  Other? No  Any new allergies since your discharge? No  Dietary orders reviewed? Yes Do you have support at home? Yes    Functional Questionnaire: (I = Independent and D = Dependent) ADLs: i  Bathing/Dressing- i  Meal Prep- i  Eating- i  Maintaining continence- i  Transferring/Ambulation- i  Managing Meds- i  Follow up appointments reviewed:  PCP Hospital f/u appt confirmed? Yes  Patient will call to schedule after 1 week Are transportation arrangements needed? No  If their condition worsens, is the pt aware to call PCP or go to the Emergency Dept.? Yes Was the patient provided with contact information for the PCP's office or ED? Yes Was to pt encouraged to call back with questions or concerns? Yes

## 2022-03-26 IMAGING — CT CT HEAD W/O CM
4 series · 16 of 47 positions shown, 18 images · non-contrast
Comparison: MRI brain 02/03/2021, head CT 02/02/2021

CLINICAL DATA: Follow-up left subdural hematoma.

EXAM:
CT HEAD WITHOUT CONTRAST
TECHNIQUE: Contiguous axial images were obtained from the base of the skull
through the vertex without intravenous contrast.

[Series 2: head bone · axial · 0.46mm/px · z∈[-101,-69]mm · 3 of 83 slices shown]
[im 9/83  bone]
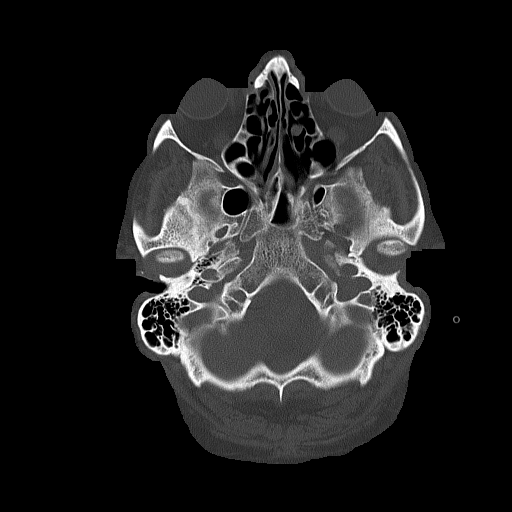
[im 17/83  bone]
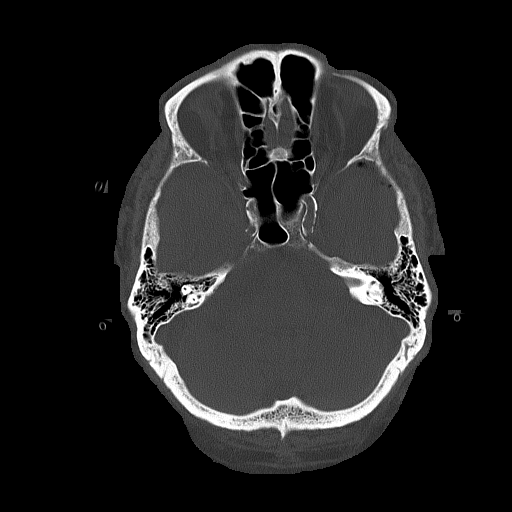
[im 25/83  bone]
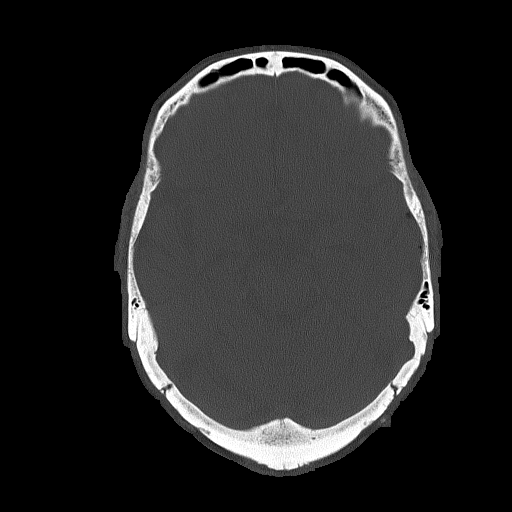

[Series 3: head wo · axial · 0.46mm/px · z∈[-97,+23]mm · 7 of 34 slices shown, 9 images]
[im 5/34  brain]
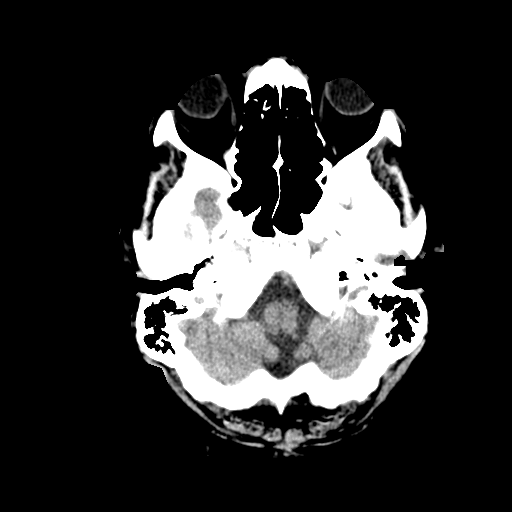
[im 5/34  bone]
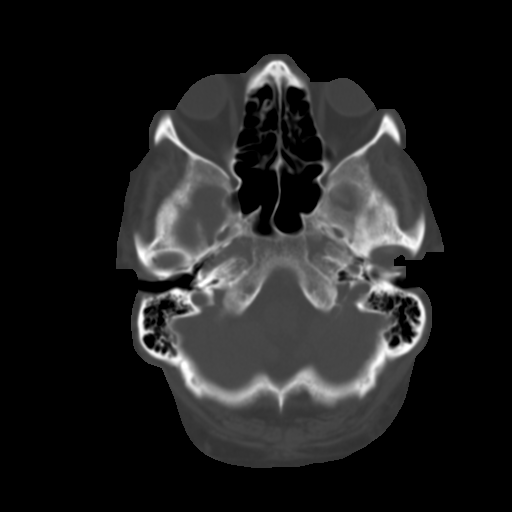
[im 9/34  brain]
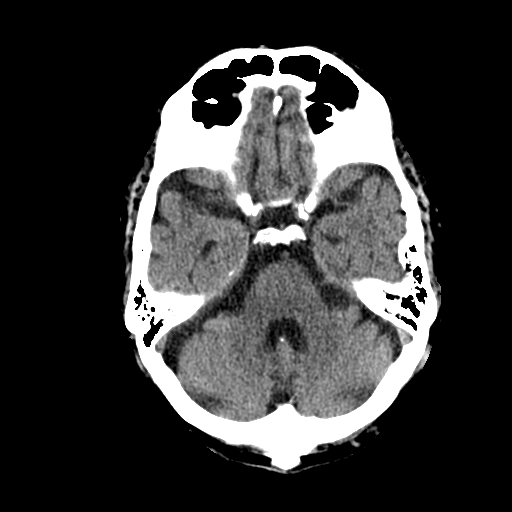
[im 13/34  brain]
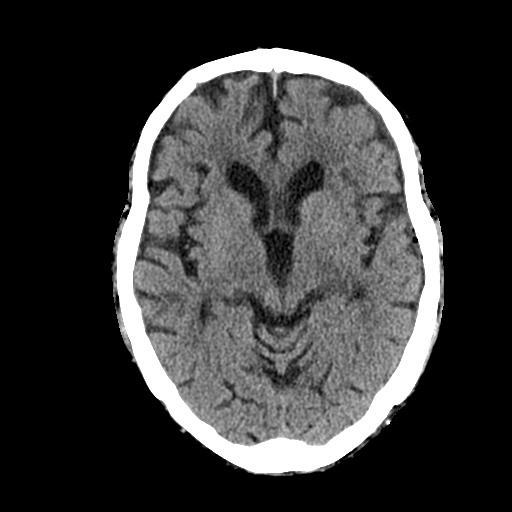
[im 17/34  brain]
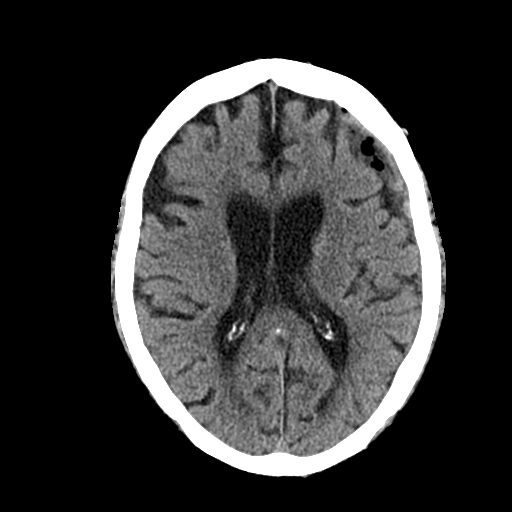
[im 21/34  brain]
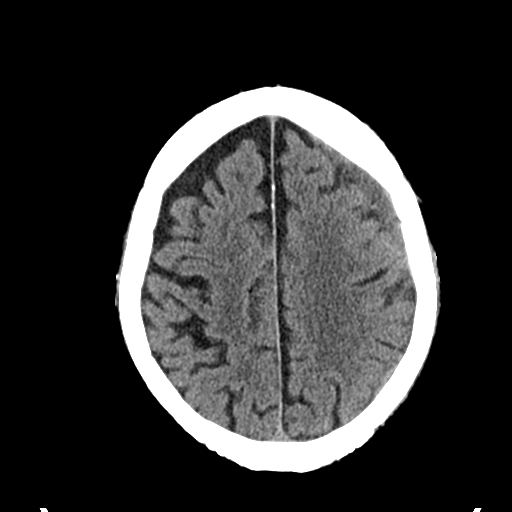
[im 21/34  bone]
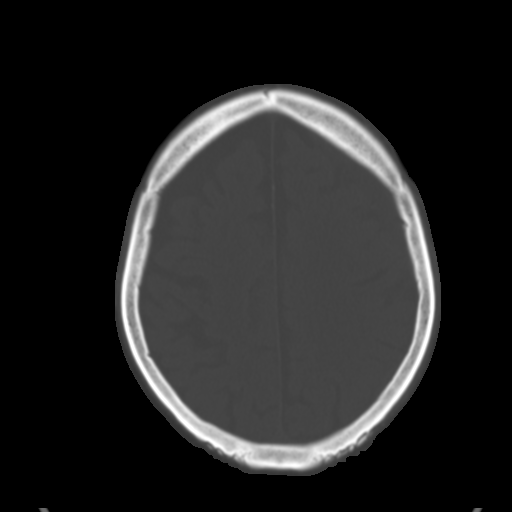
[im 25/34  brain]
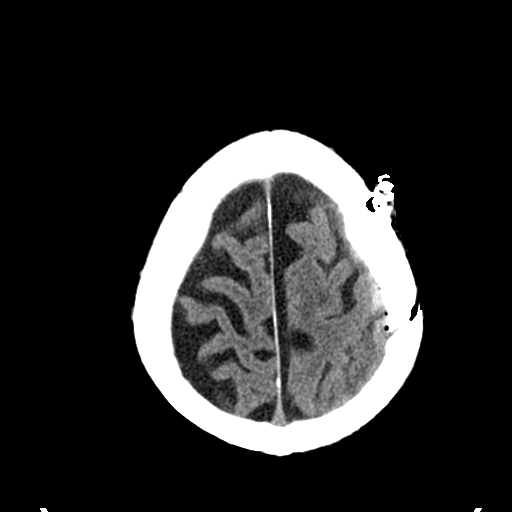
[im 29/34  brain]
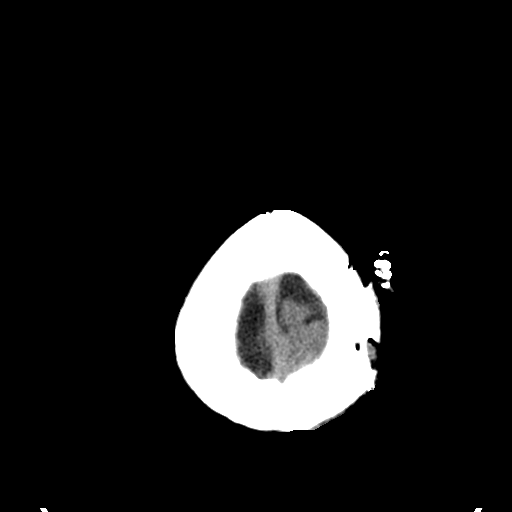

[Series 4: coronal soft tissue · coronal · 0.34mm/px · 3 of 71 slices shown]
[im 24/71  brain]
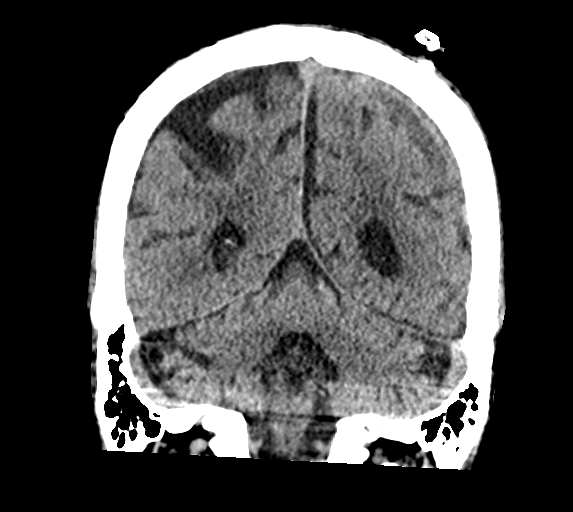
[im 32/71  brain]
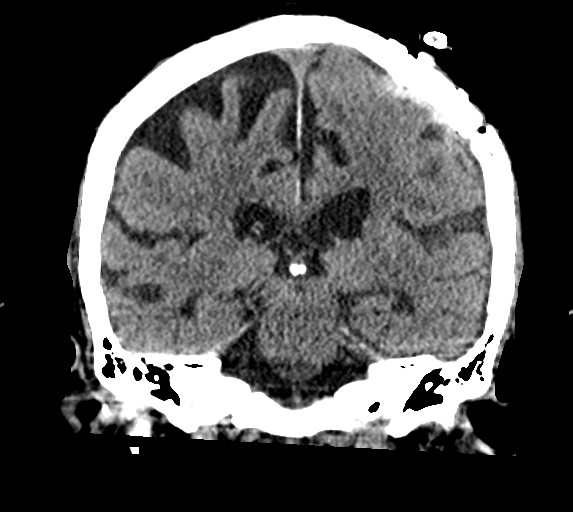
[im 39/71  brain]
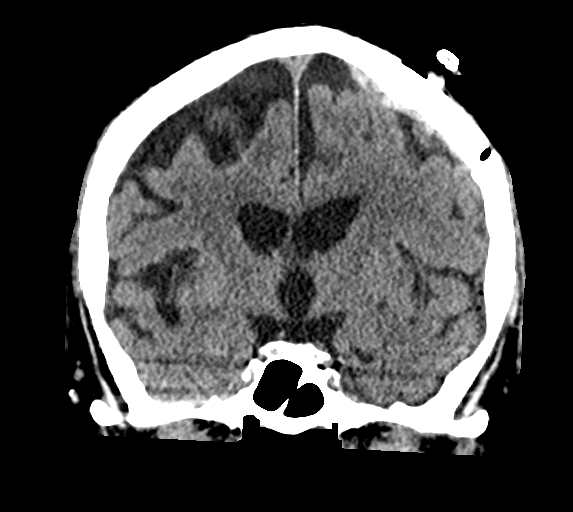

[Series 5: sagittal soft tissue · sagittal · 0.34mm/px · 3 of 66 slices shown]
[im 22/66  brain]
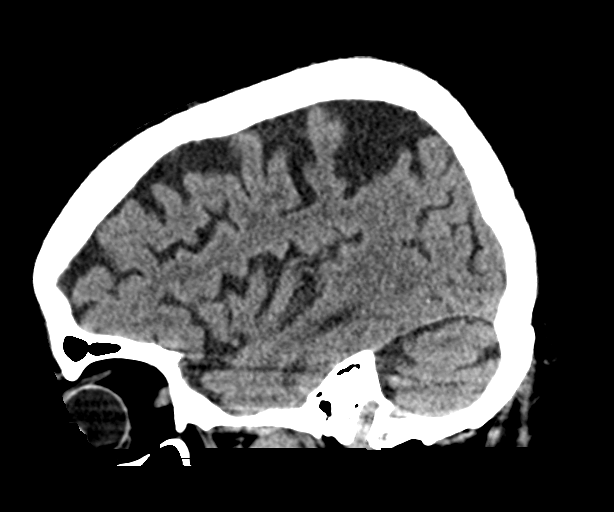
[im 33/66  brain]
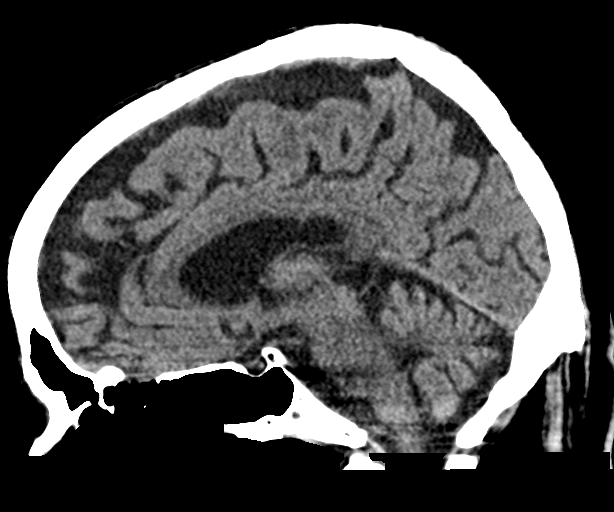
[im 44/66  brain]
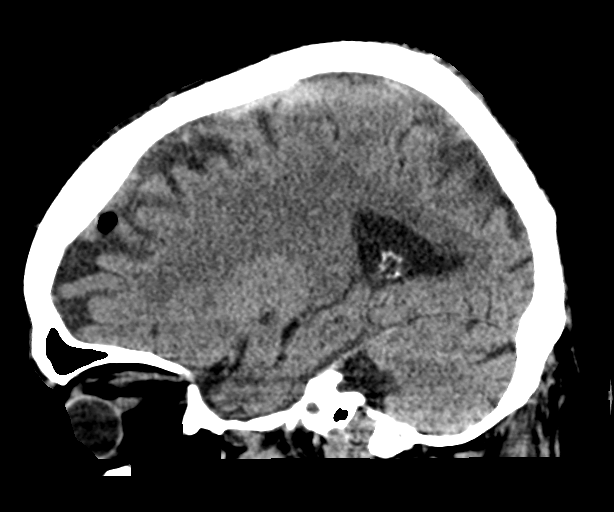

[16 of 47 positions shown; findings below may reference images not displayed]

FINDINGS: Brain: Again noted is a mixed density left cerebral convexity
subdural hematoma, decreased in size with a maximum thickness of
cm, previously 1.8 cm, with less underlying crowding of the left
frontoparietal cerebral gyri. There are small scattered air pockets
in the collection. No new hemorrhage is seen. Midline shift is
improved was previously 5 mm now 3 mm, left to right at about the
roof of the third ventricle. There is no appreciable new or acute
bleed.

There is moderately advanced cerebral atrophy and small vessel
disease. Small chronic perivascular space posterior left basal
ganglia. There is partial cerebellar atrophy. No acute infarct is
seen.

Vascular: The carotid siphons are heavily calcified. There are no
hyperdense central vessels.

Skull: There is interval new demonstration of a lateral left
parietal high convexity craniotomy with overlying skin staples.
There is an underlying surgical drain in the scalp but the drain
does not enter the subdural space. Otherwise intact calvarium.

Sinuses/Orbits: No acute finding.

Other: None.
IMPRESSION: 1. Interval left-sided craniotomy with surgical drain in the scalp
and underlying reduction in the left cerebral convexity
mixed-density subdural hematoma.
2. Scattered air pockets in the collection with decreased
left-to-right midline shift now 3 mm.
3. There is decreased mass effect on the left cerebral hemisphere
with decreased gyral crowding. There is no downward mass effect.
4. Atrophy and small vessel changes.

## 2022-04-12 NOTE — Progress Notes (Deleted)
Cardiology Office Note  Date:  04/12/2022   ID:  Kyle Roberson, DOB [birth year], MRN [id number]  PCP:  Kyle Hess, MD   No chief complaint on file.   HPI:  [name] is a 82 y.o. male with history of  coronary artery disease,  persistent atrial fibrillation,  hypertension,  hyperlipidemia,  type 2 diabetes mellitus,  chronic kidney disease, creatinine greater than 2 GERD, and  hyperparathyroidism,  Chronic diastolic CHF/pulmonary hypertension who presents for follow-up of chronic HFpEF and persistent atrial fibrillation.   LOV 10/23 Presents today with his daughter She reports that she had difficulty getting him into the car today, legs would not bend as they are very swollen He has had continued leg swelling, abdominal distention She reports that he does " fine" in the daytime On discussion of his fluid intake, he reports having high-volume coffee, up to 5 bottles of water.  She did not appreciate his volume intake Continues to take torsemide 40 daily, despite this with worsening leg swelling Daughter reports that they previously tried for 06 and it seemed to work for him but without the patch, fluid came back Reluctant to go to the hospital for inpatient care and IV Lasix  Did not stand up to be weighed today, Weight at home 263,  Can not get shoes on secondary to swelling  Lab work reviewed  A1c 7.7 BNP 260 Creatinine 2.69 BUN 51  Echocardiogram 2021 ejection fraction 55 to 60%  EKG personally reviewed by myself on todays visit Atrial fibrillation with rate 67 bpm unable to exclude old anterior MI  Other past medical history reviewed 8/22: traumatic fall, head injury CT head 1. Left parietal scalp laceration. 2. No acute intracranial process.  Change in speech, acute 01/2021 CT scan Acute on subacute left subdural hematoma measuring up to 1.7 cm in maximal diameter at the left frontal convexity. Associated mild mass effect on the subjacent left  cerebral hemisphere with up to 5 mm of left-to-right shift. No hydrocephalus or trapping.  CT head 03/11/21 1. Postsurgical changes from left frontotemporal craniotomy. 2. 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 and along the left frontal lobe markedly decreased in size since prior study   PMH:   has a past medical history of AKI (acute kidney injury) ([place]) (07/06/2018), CAD (coronary artery disease), Cellulitis of lower extremity (05/31/2019), Chronic heart failure with preserved ejection fraction (HFpEF) ([place]), CKD (chronic kidney disease), stage IV ([place]), Diabetes ([place]), GERD (gastroesophageal reflux disease), History of MI (myocardial infarction) (06/27/2014), HLD (hyperlipidemia), HTN (hypertension), PAH (pulmonary artery hypertension) ([place]), Permanent atrial fibrillation ([place]), and Subdural hematoma ([place]).  PSH:    Past Surgical History:  Procedure Laterality Date   BLEPHAROPLASTY     CARDIAC CATHETERIZATION     CORONARY STENT INTERVENTION     CRANIOTOMY Left 02/04/2021   Procedure: CRANIOTOMY FOR LEFT SUBDURAL  HEMATOMA EVACUATION;  Surgeon: Deetta Perla, MD;  Location: [facility] ORS;  Service: Neurosurgery;  Laterality: Left;   CYSTOSCOPY     HERNIA REPAIR      Current Outpatient Medications  Medication Sig Dispense Refill   apixaban (ELIQUIS) 2.5 MG TABS tablet TAKE 1 TABLET(2.5 MG) BY MOUTH TWICE DAILY 180 tablet 1   Ascorbic Acid (VITAMIN C PO) Take 1 tablet by mouth in the morning.     cephALEXin (KEFLEX) 500 MG capsule Take 2 capsules (1,000 mg total) by mouth 2 (two) times daily. 28 capsule 0   Cholecalciferol (VITAMIN D3) 1.25 MG (50000 UT)  CAPS Take 1 tablet by mouth in the morning.     Coenzyme Q10-Vitamin E (QUNOL ULTRA COQ10 PO) Take 1 capsule by mouth in the morning.     dorzolamide-timolol (COSOPT) 22.3-6.8 MG/ML ophthalmic solution Place 1 drop into the left eye 2 (two) times daily.     doxazosin (CARDURA) 1 MG tablet Take 1 tablet (1 mg total)  by mouth 2 (two) times daily. 180 tablet 0   Flaxseed, Linseed, (FLAXSEED OIL PO) Take 1 capsule by mouth in the morning.     Furosemide (FUROSCIX) 80 MG/10ML CTKT Inject 80 mg into the skin as directed. 5 each 0   furosemide (LASIX) 40 MG tablet Take 1 tablet (40 mg total) by mouth daily as needed. 90 tablet 0   furosemide (LASIX) 80 MG tablet Take 80 mg by mouth daily.     glipiZIDE (GLUCOTROL XL) 10 MG 24 hr tablet Take 10 mg by mouth daily with breakfast.     hydrALAZINE (APRESOLINE) 100 MG tablet 100 mg three times a day, with extra 100 mg as needed for high pressure. 360 tablet 3   isosorbide mononitrate (IMDUR) 30 MG 24 hr tablet Take 30 mg by mouth in the morning, at noon, and at bedtime.     ketorolac (ACULAR) 0.5 % ophthalmic solution Place 1 drop into the left eye 4 (four) times daily. 5 mL 0   Multiple Vitamin (MULTIVITAMIN WITH MINERALS) TABS tablet Take 1 tablet by mouth in the morning.     nitroGLYCERIN (NITROLINGUAL) 0.4 MG/SPRAY spray Place 1 spray under the tongue as directed. 12 g 0   Omega-3 Fatty Acids (FISH OIL PO) Take 1 capsule by mouth in the morning.     oxyCODONE-acetaminophen (PERCOCET/ROXICET) 5-325 MG tablet Take 1 tablet by mouth every 6 (six) hours as needed for severe pain. 20 tablet 0   potassium chloride SA (KLOR-CON M) 20 MEQ tablet Take 20 mEq by mouth once a week. 2-3 times a week     rosuvastatin (CRESTOR) 10 MG tablet Take 1 tablet (10 mg total) by mouth 3 (three) times a week. 45 tablet 3   Turmeric (QC TUMERIC COMPLEX PO) Take 1 capsule by mouth in the morning. Qunol Turmeric Curcumin- 40 MG     valACYclovir (VALTREX) 1000 MG tablet Take 1 tablet (1,000 mg total) by mouth 3 (three) times daily. 21 tablet 0   No current facility-administered medications for this visit.    Allergies:   Atorvastatin and Simvastatin   Social History:  The patient  reports that he has quit smoking. He has never used smokeless tobacco. He reports that he does not currently  use alcohol after a past usage of about 2.0 standard drinks of alcohol per week. He reports that he does not currently use drugs.   Family History:   family history includes CAD in his father; Diabetes in his brother; Pancreatic cancer in his mother.    Review of Systems: Review of Systems  Constitutional: Negative.   HENT: Negative.    Respiratory:  Positive for shortness of breath.   Cardiovascular:  Positive for leg swelling.  Gastrointestinal: Negative.   Musculoskeletal: Negative.        Legs are weak  Neurological: Negative.   Psychiatric/Behavioral: Negative.    All other systems reviewed and are negative.   PHYSICAL EXAM: VS:  There were no vitals taken for this visit. , BMI There is no height or weight on file to calculate BMI.  Constitutional:  oriented to person,  place, and time. No distress.  HENT:  Head: Grossly normal Eyes:  no discharge. No scleral icterus.  Neck: No JVD, no carotid bruits  Cardiovascular: Regular rate and rhythm, no murmurs appreciated Woody lower extremity edema extending into the thighs Pulmonary/Chest: Clear to auscultation bilaterally, no wheezes Scattered Rales, unable to exclude dullness at the bases Abdominal: Soft.  Mild distension.  no tenderness.  Musculoskeletal: Normal range of motion Neurological:  normal muscle tone. Coordination normal. No atrophy Skin: Skin warm and dry Psychiatric: normal affect, pleasant   Recent Labs: 05/05/2021: TSH 2.538 02/19/2022: Magnesium 2.5 03/05/2022: B Natriuretic Peptide 208.1 03/16/2022: ALT 28; BUN 53; Creatinine, Ser 2.79; Hemoglobin 12.5; Platelets 153; Potassium 4.5; Sodium 139    Lipid Panel Lab Results  Component Value Date   CHOL 121 05/05/2021   HDL 34 (L) 05/05/2021   LDLCALC 68 05/05/2021   TRIG 95 05/05/2021    Wt Readings from Last 3 Encounters:  03/16/22 220 lb (99.8 kg)  03/05/22 224 lb 4 oz (101.7 kg)  02/21/22 165 lb 12.6 oz (75.2 kg)     ASSESSMENT AND  PLAN:  Problem List Items Addressed This Visit   None Persistent atrial fibrillation Slow ventricular rate in the past, not on beta-blockers or calcium channel blockers Avoid beta-blockers, calcium channel blockers On anticoagulation Eliquis 2.5 twice daily Rate relatively well controlled on today's visit  Chronic diastolic CHF Likely pulmonary hypertension contributing, moderately elevated pressures in early 2021 Repeat echocardiogram ordered -We will reorder furosex 5 days, recommend he take this every other day Suggest to increase torsemide up to 40 in the morning 20 in the afternoon Consider adding a dose of metolazone sparingly  leg wraps for leg swelling, concern for lymphedema -He is declining hospital admission and IV Lasix at this time -Fluid restriction discussed  Morbid obesity Unable to exercise, calorie restriction recommended  Chronic renal failure Followed by nephrology Creatinine greater than 2.5 in the setting of chronic diuretic use, pulmonary hypertension, diabetes  Essential hypertension Blood pressure is well controlled on today's visit. No changes made to the medications.    Total encounter time more than 40 minutes  Greater than 50% was spent in counseling and coordination of care with the patient    Signed, Esmond Plants, M.D., Ph.D. [place], [place]

## 2022-04-13 ENCOUNTER — Ambulatory Visit: Payer: Medicare HMO | Admitting: Cardiovascular Disease

## 2022-04-20 ENCOUNTER — Encounter: Payer: Medicare HMO | Admitting: Internal Medicine

## 2022-05-12 ENCOUNTER — Ambulatory Visit (HOSPITAL_BASED_OUTPATIENT_CLINIC_OR_DEPARTMENT_OTHER): Payer: Medicare HMO | Admitting: Internal Medicine

## 2022-05-12 ENCOUNTER — Other Ambulatory Visit
Admission: RE | Admit: 2022-05-12 | Discharge: 2022-05-12 | Disposition: A | Discharge status: Home / Self Care | Payer: Medicare HMO | Source: Ambulatory Visit | Attending: Internal Medicine | Admitting: Internal Medicine

## 2022-05-12 ENCOUNTER — Encounter: Payer: Self-pay | Admitting: Internal Medicine

## 2022-05-12 VITALS — BP 124/62 | HR 56 | Resp 16 | Wt 235.5 lb

## 2022-05-12 DIAGNOSIS — I2510 Atherosclerotic heart disease of native coronary artery without angina pectoris: Secondary | ICD-10-CM | POA: Insufficient documentation

## 2022-05-12 DIAGNOSIS — E1122 Type 2 diabetes mellitus with diabetic chronic kidney disease: Secondary | ICD-10-CM | POA: Insufficient documentation

## 2022-05-12 DIAGNOSIS — I2721 Secondary pulmonary arterial hypertension: Secondary | ICD-10-CM | POA: Insufficient documentation

## 2022-05-12 DIAGNOSIS — I5033 Acute on chronic diastolic (congestive) heart failure: Secondary | ICD-10-CM | POA: Insufficient documentation

## 2022-05-12 DIAGNOSIS — Z79899 Other long term (current) drug therapy: Secondary | ICD-10-CM | POA: Insufficient documentation

## 2022-05-12 DIAGNOSIS — I5032 Chronic diastolic (congestive) heart failure: Secondary | ICD-10-CM

## 2022-05-12 DIAGNOSIS — N184 Chronic kidney disease, stage 4 (severe): Secondary | ICD-10-CM

## 2022-05-12 DIAGNOSIS — I482 Chronic atrial fibrillation, unspecified: Secondary | ICD-10-CM | POA: Diagnosis not present

## 2022-05-12 DIAGNOSIS — I4821 Permanent atrial fibrillation: Secondary | ICD-10-CM | POA: Insufficient documentation

## 2022-05-12 DIAGNOSIS — I130 Hypertensive heart and chronic kidney disease with heart failure and stage 1 through stage 4 chronic kidney disease, or unspecified chronic kidney disease: Secondary | ICD-10-CM | POA: Insufficient documentation

## 2022-05-12 DIAGNOSIS — Z955 Presence of coronary angioplasty implant and graft: Secondary | ICD-10-CM | POA: Insufficient documentation

## 2022-05-12 DIAGNOSIS — Z7901 Long term (current) use of anticoagulants: Secondary | ICD-10-CM | POA: Insufficient documentation

## 2022-05-12 LAB — BASIC METABOLIC PANEL
Anion gap: 11 (ref 5–15)
BUN: 72 mg/dL — ABNORMAL HIGH (ref 8–23)
CO2: 28 mmol/L (ref 22–32)
Calcium: 9.4 mg/dL (ref 8.9–10.3)
Chloride: 102 mmol/L (ref 98–111)
Creatinine, Ser: 2.75 mg/dL — ABNORMAL HIGH (ref 0.61–1.24)
GFR, Estimated: 22 mL/min — ABNORMAL LOW (ref >60)
Glucose, Bld: 124 mg/dL — ABNORMAL HIGH (ref 70–99)
Potassium: 4.2 mmol/L (ref 3.5–5.1)
Sodium: 141 mmol/L (ref 135–145)

## 2022-05-12 LAB — BRAIN NATRIURETIC PEPTIDE: B Natriuretic Peptide: 195.2 pg/mL — ABNORMAL HIGH (ref 0.0–100.0)

## 2022-05-12 MED ORDER — FUROSEMIDE 40 MG PO TABS: ORAL_TABLET | ORAL | 3 refills | Status: DC

## 2022-05-12 NOTE — Patient Instructions (Signed)
Medication Changes:  CHANGE how you take your Lasix: Take 80 mg lasix for the next 2 days then Take 1 tablet (40 mg) daily every other day, alternating with 2 (80 mg) tablets daily every other day  Lab Work:  Labs done today, your results will be available in MyChart, we will contact you for abnormal readings.    Special Instructions // Education:  Do the following things EVERYDAY: Weigh yourself in the morning before breakfast. Write it down and keep it in a log. Take your medicines as prescribed Eat low salt foods--Limit salt (sodium) to 2000 mg per day.  Stay as active as you can everyday Limit all fluids for the day to less than 2 liters   Follow-Up in: 6 weeks with Dr. Haroldine Laws. We will call you to schedule appointment closer to the date    If you have any questions or concerns before your next appointment please send [place] a message through mychart or call our office at [phone] Monday-Friday 8 am-5 pm.   If you have an urgent need after hours on the weekend please call your Primary Cardiologist or the [facility] in [place] at [phone].

## 2022-05-12 NOTE — Progress Notes (Signed)
ADVANCED HF CLINIC NOTE  [id number], Kyle Sans, MD Primary Cardiologist: Kyle Rogue, MD   HPI:  Kyle Roberson is an 82 y.o. 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   Presented to ED for for evaluation on 11/19. BNP 263, Scr 2.69 (baseline Scr had been variable, ranging 2-2.5), . HS troponin 22>21. CXR with evidence of CHF. He was given IV lasix and admitted for acute on chronic CHF. Cardiology consulted. He has been diuresing with lasix gtt and metolazone, currently negative 15L since admit. Scr trended up to 3.06 several days ago >> now improved to 2.72. Lasix gtt had been cut back to 4 mg/hr with decrease in UOP, lasix gtt subsequently increased back to 8 mg/hr this am. Remains persistently volume overloaded. Nephrology and Advanced Heart Failure asked to evaluate patient.   Echo 11/23 EF 55-60% RV moderately dilated/moderate HK. Mild AS RVSP 28mHG. Has never had RHC   Was admitted in 11/23 with massive volume overloaded. Diuresed 50 pounds. D/c weight 220.   Here significant other (Kyle Roberson). Says he is doing pretty well. Recently was struggling with shingles but now improved. Weight at home has ranged 228-232 (up about 5 pounds but doesn't think it is fluid). Mild LE edema. No orthopnea or PND. Able to do ADLs. Compliant with meds. On lasix 40 daily (was 80 daily but we decreased at last visit)   ROS: All systems negative except as listed in HPI, PMH and Problem List.  SH:  Social History   Socioeconomic History   Marital status: Married    Spouse name: Not on file   Number of children: Not on file   Years of education: Not on file   Highest education level: Not on file  Occupational History   Not on file  Tobacco Use   Smoking status: Former   Smokeless tobacco: Never  Vaping Use   Vaping Use: Never used  Substance and Sexual Activity   Alcohol  use: Not Currently    Alcohol/week: 2.0 standard drinks of alcohol    Types: 2 Glasses of wine per week   Drug use: Not Currently   Sexual activity: Not on file  Other Topics Concern   Not on file  Social History Narrative   Lives at home with wife   Social Determinants of Health   Financial Resource Strain: Not on file  Food Insecurity: No Food Insecurity (02/11/2022)   Hunger Vital Sign    Worried About Running Out of Food in the Last Year: Never true    Ran Out of Food in the Last Year: Never true  Transportation Needs: No Transportation Needs (02/11/2022)   PRAPARE - THydrologist(Medical): No    Lack of Transportation (Non-Medical): No  Physical Activity: Not on file  Stress: Not on file  Social Connections: Not on file  Intimate Partner Violence: Not At Risk (02/11/2022)   Humiliation, Afraid, Rape, and Kick questionnaire    Fear of Current or Ex-Partner: No    Emotionally Abused: No    Physically Abused: No    Sexually Abused: No    FH:  Family History  Problem Relation Age of Onset   Pancreatic cancer Mother    CAD Father    Diabetes Brother     Past Medical History:  Diagnosis Date   AKI (acute kidney injury) ([place] 07/06/2018  CAD (coronary artery disease)    a. Remote PCI/stenting to LAD w PTCA Diagnoal. RCA 60%; b.  2005/2008 Cardiolites w/ reportedly mild ischemia in Diag territory-->Med rx.   Cellulitis of lower extremity 05/31/2019   Chronic heart failure with preserved ejection fraction (HFpEF) ([place])    a. 04/2019 Echo: EF 55-60%, no rwma, nl RV fxn, RVSP 55.12mHg. Mod dil LA. Mild-mod MR. Mod dil PA.   CKD (chronic kidney disease), stage IV ([facility])    Diabetes ([facility])    GERD (gastroesophageal reflux disease)    History of MI (myocardial infarction) 06/27/2014   HLD (hyperlipidemia)    HTN (hypertension)    PAH (pulmonary artery hypertension) ([place]    a. 04/2019 Echo: RVSP 55.79mg.   Permanent atrial fibrillation ([facility])     a. CHA2DS2VASc = 5-->dose adjusted eliquis (age/creat).   Subdural hematoma ([facility]   a. 01/2021 in setting of fall s/p L frontotemporal craniotomy.    Current Outpatient Medications  Medication Sig Dispense Refill   apixaban (ELIQUIS) 2.5 MG TABS tablet TAKE 1 TABLET(2.5 MG) BY MOUTH TWICE DAILY 180 tablet 1   Ascorbic Acid (VITAMIN C PO) Take 1 tablet by mouth in the morning.     Cholecalciferol (VITAMIN D3) 1.25 MG (50000 UT) CAPS Take 1 tablet by mouth in the morning.     Coenzyme Q10-Vitamin E (QUNOL ULTRA COQ10 PO) Take 1 capsule by mouth in the morning.     dorzolamide-timolol (COSOPT) 22.3-6.8 MG/ML ophthalmic solution Place 1 drop into the left eye 2 (two) times daily.     doxazosin (CARDURA) 1 MG tablet Take 1 tablet (1 mg total) by mouth 2 (two) times daily. 180 tablet 0   Flaxseed, Linseed, (FLAXSEED OIL PO) Take 1 capsule by mouth in the morning.     Furosemide (FUROSCIX) 80 MG/10ML CTKT Inject 80 mg into the skin as directed. 5 each 0   furosemide (LASIX) 40 MG tablet Take 1 tablet (40 mg total) by mouth daily as needed. 90 tablet 0   glipiZIDE (GLUCOTROL XL) 10 MG 24 hr tablet Take 10 mg by mouth daily with breakfast.     hydrALAZINE (APRESOLINE) 100 MG tablet 100 mg three times a day, with extra 100 mg as needed for high pressure. 360 tablet 3   isosorbide mononitrate (IMDUR) 30 MG 24 hr tablet Take 30 mg by mouth in the morning, at noon, and at bedtime.     ketorolac (ACULAR) 0.5 % ophthalmic solution Place 1 drop into the left eye 4 (four) times daily. 5 mL 0   Multiple Vitamin (MULTIVITAMIN WITH MINERALS) TABS tablet Take 1 tablet by mouth in the morning.     nitroGLYCERIN (NITROLINGUAL) 0.4 MG/SPRAY spray Place 1 spray under the tongue as directed. 12 g 0   Omega-3 Fatty Acids (FISH OIL PO) Take 1 capsule by mouth in the morning.     potassium chloride SA (KLOR-CON M) 20 MEQ tablet Take 20 mEq by mouth once a week. 2-3 times a week     rosuvastatin (CRESTOR) 10 MG tablet  Take 1 tablet (10 mg total) by mouth 3 (three) times a week. 45 tablet 3   Turmeric (QC TUMERIC COMPLEX PO) Take 1 capsule by mouth in the morning. Qunol Turmeric Curcumin- 40 MG     No current facility-administered medications for this visit.    Vitals:   05/12/22 1122  BP: 124/62  Pulse: (!) 56  Resp: 16  SpO2: 100%  Weight: 235 lb 8 oz (106.8 kg)  PHYSICAL EXAM:  General:  Sitting in chair No resp difficulty HEENT: normal Neck: supple. JVP 8-9 with prominent v waves Carotids 2+ bilat; no bruits. No lymphadenopathy or thryomegaly appreciated. Cor: PMI nondisplaced. Irregular rate & rhythm. No rubs, gallops or murmurs. Lungs: clear Abdomen: obese soft, nontender, nondistended. No hepatosplenomegaly. No bruits or masses. Good bowel sounds. 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 moves all 4 extremities w/o difficulty. Affect pleasant   ASSESSMENT & PLAN:  Acute on chronic diastolic CHF with prominent R-sided HF:  Echo 02/21: EF 55-60%, RV okay, RVSP 56 mmHg, dilated pulmonary artery, dilated IVC -Echo 11/23: EF 55-60%, interventricular septum flattened in systole consistent with RV pressure overload, RV function okay, RVSP severely elevated 83 mmHg, mild MR, moderate TR, dilated pulmonary artery, dilated IVC - Stable NYHA II-III - Volume status  elevated after recent decrease in lasix from 80 -> 40 daily - Will increase lasix to 80 daily alternating with 40 daily.(Take lasix 80 for next 2 days to start)  Needs Furoscix for breakthrough periods  - Avoiding SGLT2i with obesity and poor hygiene - Continue hydralazine 100 mg TID + imdur 30 daily - GDMT and tight volume control limited by renal impairment - Labs today   2. 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 Improved with diuresis - consider sleep study/ONOX  - doubt he will comply with CPAP but may benefit from nocturnal O2 - I do not think RHC will change our management at this point - No change   3. CAD: -Hx remote stenting to LAD and diagonal - No s/s angina - Not candidate for cath with CKD IV -Continue statin -No aspirin since anticoagulated   4. Permanent atrial fibrillation: -Rate 50s without rate control meds -On Eliquis 2.5 mg BID (appropriate dose for age and renal function) - No bleeding   5. CKD IV: -Scr variable, has been averaging 2-2.5. New baseline 2.5-3.0  -Suspect cardiorenal -Nephrology following as outpatient -Labs today   6. HTN: -Options limited by renal impairment -With CKD IV would not push BP < 120  Glori Bickers, MD  11:54 AM

## 2022-05-27 ENCOUNTER — Other Ambulatory Visit: Payer: Self-pay | Admitting: *Deleted

## 2022-05-27 MED ORDER — FUROSCIX 80 MG/10ML ~~LOC~~ CTKT: 80.0000 mg | CARTRIDGE | SUBCUTANEOUS | 0 refills | Status: DC

## 2022-06-15 ENCOUNTER — Other Ambulatory Visit: Payer: Self-pay | Admitting: Cardiovascular Disease

## 2022-06-15 DIAGNOSIS — I4819 Other persistent atrial fibrillation: Secondary | ICD-10-CM

## 2022-06-15 NOTE — Progress Notes (Deleted)
Cardiology Office Note  Date:  06/15/2022   ID:  Kyle Roberson, DOB 1940/08/16, MRN [id number]  PCP:  Kyle Hess, MD   No chief complaint on file.   HPI:  Kyle Roberson is a 82 y.o. male with history of  coronary artery disease,  persistent atrial fibrillation,  hypertension,  hyperlipidemia,  type 2 diabetes mellitus,  chronic kidney disease, creatinine greater than 2 GERD, and  hyperparathyroidism,  Chronic diastolic CHF/pulmonary hypertension who presents for follow-up of chronic HFpEF and persistent atrial fibrillation.   Last seen by myself in clinic 10/23 Seen by CHF clinic for pulmonary hypertension May 12, 2022 Lasix 80 alternating with 40 Avoiding SGLT2 inhibitor secondary to obesity/poor hygiene Hydralazine 100 3 times daily, Imdur 30  Presents today with his daughter She reports that she had difficulty getting him into the car today, legs would not bend as they are very swollen He has had continued leg swelling, abdominal distention She reports that he does " fine" in the daytime On discussion of his fluid intake, he reports having high-volume coffee, up to 5 bottles of water.  She did not appreciate his volume intake Continues to take torsemide 40 daily, despite this with worsening leg swelling Daughter reports that they previously tried for 06 and it seemed to work for him but without the patch, fluid came back Reluctant to go to the hospital for inpatient care and IV Lasix  Did not stand up to be weighed today, Weight at home 263,  Can not get shoes on secondary to swelling  Lab work reviewed  A1c 7.7 BNP 260 Creatinine 2.69 BUN 51  Echocardiogram 2021 ejection fraction 55 to 60%  EKG personally reviewed by myself on todays visit Atrial fibrillation with rate 67 bpm unable to exclude old anterior MI  Other past medical history reviewed 8/22: traumatic fall, head injury CT head 1. Left parietal scalp laceration. 2. No acute  intracranial process.  Change in speech, acute 01/2021 CT scan Acute on subacute left subdural hematoma measuring up to 1.7 cm in maximal diameter at the left frontal convexity. Associated mild mass effect on the subjacent left cerebral hemisphere with up to 5 mm of left-to-right shift. No hydrocephalus or trapping.  CT head 03/11/21 1. Postsurgical changes from left frontotemporal craniotomy. 2. 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 and along the left frontal lobe markedly decreased in size since prior study   PMH:   has a past medical history of AKI (acute kidney injury) ([place]) (07/06/2018), CAD (coronary artery disease), Cellulitis of lower extremity (05/31/2019), Chronic heart failure with preserved ejection fraction (HFpEF) ([place]), CKD (chronic kidney disease), stage IV ([place]), Diabetes ([place]), GERD (gastroesophageal reflux disease), History of MI (myocardial infarction) (06/27/2014), HLD (hyperlipidemia), HTN (hypertension), PAH (pulmonary artery hypertension) ([place]), Permanent atrial fibrillation ([place]), and Subdural hematoma ([place]).  PSH:    Past Surgical History:  Procedure Laterality Date   BLEPHAROPLASTY     CARDIAC CATHETERIZATION     CORONARY STENT INTERVENTION     CRANIOTOMY Left 02/04/2021   Procedure: CRANIOTOMY FOR LEFT SUBDURAL  HEMATOMA EVACUATION;  Surgeon: Kyle Perla, MD;  Location: [facility] ORS;  Service: Neurosurgery;  Laterality: Left;   CYSTOSCOPY     HERNIA REPAIR      Current Outpatient Medications  Medication Sig Dispense Refill   Ascorbic Acid (VITAMIN C PO) Take 1 tablet by mouth in the morning.     Cholecalciferol (VITAMIN D3) 1.25 MG (50000 UT) CAPS Take 1 tablet by mouth  in the morning.     Coenzyme Q10-Vitamin E (QUNOL ULTRA COQ10 PO) Take 1 capsule by mouth in the morning.     dorzolamide-timolol (COSOPT) 22.3-6.8 MG/ML ophthalmic solution Place 1 drop into the left eye 2 (two) times daily.     doxazosin (CARDURA) 1 MG tablet  Take 1 tablet (1 mg total) by mouth 2 (two) times daily. 180 tablet 0   ELIQUIS 2.5 MG TABS tablet TAKE 1 TABLET(2.5 MG) BY MOUTH TWICE DAILY 180 tablet 1   Flaxseed, Linseed, (FLAXSEED OIL PO) Take 1 capsule by mouth in the morning.     Furosemide (FUROSCIX) 80 MG/10ML CTKT Inject 80 mg into the skin as directed. 5 each 0   furosemide (LASIX) 40 MG tablet Take 1 tablet daily every other day, alternating with 2 tabs daily every other day 45 tablet 3   glipiZIDE (GLUCOTROL XL) 10 MG 24 hr tablet Take 10 mg by mouth daily with breakfast.     hydrALAZINE (APRESOLINE) 100 MG tablet 100 mg three times a day, with extra 100 mg as needed for high pressure. 360 tablet 3   isosorbide mononitrate (IMDUR) 30 MG 24 hr tablet Take 30 mg by mouth in the morning, at noon, and at bedtime.     ketorolac (ACULAR) 0.5 % ophthalmic solution Place 1 drop into the left eye 4 (four) times daily. 5 mL 0   Multiple Vitamin (MULTIVITAMIN WITH MINERALS) TABS tablet Take 1 tablet by mouth in the morning.     nitroGLYCERIN (NITROLINGUAL) 0.4 MG/SPRAY spray Place 1 spray under the tongue as directed. 12 g 0   Omega-3 Fatty Acids (FISH OIL PO) Take 1 capsule by mouth in the morning.     potassium chloride SA (KLOR-CON M) 20 MEQ tablet Take 20 mEq by mouth once a week. 2-3 times a week     rosuvastatin (CRESTOR) 10 MG tablet Take 1 tablet (10 mg total) by mouth 3 (three) times a week. 45 tablet 3   Turmeric (QC TUMERIC COMPLEX PO) Take 1 capsule by mouth in the morning. Qunol Turmeric Curcumin- 40 MG     No current facility-administered medications for this visit.    Allergies:   Atorvastatin and Simvastatin   Social History:  The patient  reports that he has quit smoking. He has never used smokeless tobacco. He reports that he does not currently use alcohol after a past usage of about 2.0 standard drinks of alcohol per week. He reports that he does not currently use drugs.   Family History:   family history includes CAD in  his father; Diabetes in his brother; Pancreatic cancer in his mother.    Review of Systems: Review of Systems  Constitutional: Negative.   HENT: Negative.    Respiratory:  Positive for shortness of breath.   Cardiovascular:  Positive for leg swelling.  Gastrointestinal: Negative.   Musculoskeletal: Negative.        Legs are weak  Neurological: Negative.   Psychiatric/Behavioral: Negative.    All other systems reviewed and are negative.   PHYSICAL EXAM: VS:  There were no vitals taken for this visit. , BMI There is no height or weight on file to calculate BMI.  Constitutional:  oriented to person, place, and time. No distress.  HENT:  Head: Grossly normal Eyes:  no discharge. No scleral icterus.  Neck: No JVD, no carotid bruits  Cardiovascular: Regular rate and rhythm, no murmurs appreciated Woody lower extremity edema extending into the thighs Pulmonary/Chest: Clear to  auscultation bilaterally, no wheezes Scattered Rales, unable to exclude dullness at the bases Abdominal: Soft.  Mild distension.  no tenderness.  Musculoskeletal: Normal range of motion Neurological:  normal muscle tone. Coordination normal. No atrophy Skin: Skin warm and dry Psychiatric: normal affect, pleasant   Recent Labs: 02/19/2022: Magnesium 2.5 03/16/2022: ALT 28; Hemoglobin 12.5; Platelets 153 05/12/2022: B Natriuretic Peptide 195.2; BUN 72; Creatinine, Ser 2.75; Potassium 4.2; Sodium 141    Lipid Panel Lab Results  Component Value Date   CHOL 121 05/05/2021   HDL 34 (L) 05/05/2021   LDLCALC 68 05/05/2021   TRIG 95 05/05/2021    Wt Readings from Last 3 Encounters:  05/12/22 235 lb 8 oz (106.8 kg)  03/16/22 220 lb (99.8 kg)  03/05/22 224 lb 4 oz (101.7 kg)     ASSESSMENT AND PLAN:  Problem List Items Addressed This Visit   None Persistent atrial fibrillation Slow ventricular rate in the past, not on beta-blockers or calcium channel blockers Avoid beta-blockers, calcium channel  blockers On anticoagulation Eliquis 2.5 twice daily Rate relatively well controlled on today's visit  Chronic diastolic CHF Likely pulmonary hypertension contributing, moderately elevated pressures in early 2021 Repeat echocardiogram ordered -We will reorder furosex 5 days, recommend he take this every other day Suggest to increase torsemide up to 40 in the morning 20 in the afternoon Consider adding a dose of metolazone sparingly  leg wraps for leg swelling, concern for lymphedema -He is declining hospital admission and IV Lasix at this time -Fluid restriction discussed  Morbid obesity Unable to exercise, calorie restriction recommended  Chronic renal failure Followed by nephrology Creatinine greater than 2.5 in the setting of chronic diuretic use, pulmonary hypertension, diabetes  Essential hypertension Blood pressure is well controlled on today's visit. No changes made to the medications.    Total encounter time more than 40 minutes  Greater than 50% was spent in counseling and coordination of care with the patient    Signed, Esmond Plants, M.D., Ph.D. [place], [place]

## 2022-06-15 NOTE — Telephone Encounter (Signed)
Please review

## 2022-06-15 NOTE — Telephone Encounter (Signed)
Prescription refill request for Eliquis received. Indication: Afib  Last office visit: 05/12/22 ([facility])  Scr: 2.75 (05/12/22)  Age: 82 Weight: 106.8kg  Appropriate dose. Refill sent.

## 2022-06-16 ENCOUNTER — Ambulatory Visit: Payer: Medicare HMO | Admitting: Cardiovascular Disease

## 2022-06-16 DIAGNOSIS — I2510 Atherosclerotic heart disease of native coronary artery without angina pectoris: Secondary | ICD-10-CM

## 2022-06-16 DIAGNOSIS — I25118 Atherosclerotic heart disease of native coronary 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: Secondary | ICD-10-CM

## 2022-06-16 DIAGNOSIS — E1169 Type 2 diabetes mellitus with other specified complication: Secondary | ICD-10-CM

## 2022-06-16 DIAGNOSIS — I272 Pulmonary hypertension, unspecified: Secondary | ICD-10-CM

## 2022-06-16 DIAGNOSIS — I10 Essential (primary) hypertension: Secondary | ICD-10-CM

## 2022-06-16 DIAGNOSIS — I5032 Chronic diastolic (congestive) heart failure: Secondary | ICD-10-CM

## 2022-06-16 DIAGNOSIS — N184 Chronic kidney disease, stage 4 (severe): Secondary | ICD-10-CM

## 2022-06-16 DIAGNOSIS — I482 Chronic atrial fibrillation, unspecified: Secondary | ICD-10-CM

## 2022-06-22 ENCOUNTER — Encounter: Payer: Self-pay | Admitting: Internal Medicine

## 2022-06-22 ENCOUNTER — Ambulatory Visit: Payer: Medicare HMO | Attending: Internal Medicine | Admitting: Internal Medicine

## 2022-06-22 VITALS — BP 134/54 | HR 61 | Wt 236.0 lb

## 2022-06-22 DIAGNOSIS — I4821 Permanent atrial fibrillation: Secondary | ICD-10-CM | POA: Diagnosis not present

## 2022-06-22 DIAGNOSIS — I482 Chronic atrial fibrillation, unspecified: Secondary | ICD-10-CM | POA: Diagnosis not present

## 2022-06-22 DIAGNOSIS — Z9889 Other specified postprocedural states: Secondary | ICD-10-CM | POA: Diagnosis not present

## 2022-06-22 DIAGNOSIS — I130 Hypertensive heart and chronic kidney disease with heart failure and stage 1 through stage 4 chronic kidney disease, or unspecified chronic kidney disease: Secondary | ICD-10-CM | POA: Diagnosis not present

## 2022-06-22 DIAGNOSIS — Z7901 Long term (current) use of anticoagulants: Secondary | ICD-10-CM | POA: Diagnosis not present

## 2022-06-22 DIAGNOSIS — Z955 Presence of coronary angioplasty implant and graft: Secondary | ICD-10-CM | POA: Diagnosis not present

## 2022-06-22 DIAGNOSIS — N184 Chronic kidney disease, stage 4 (severe): Secondary | ICD-10-CM | POA: Insufficient documentation

## 2022-06-22 DIAGNOSIS — I5032 Chronic diastolic (congestive) heart failure: Secondary | ICD-10-CM | POA: Diagnosis not present

## 2022-06-22 DIAGNOSIS — E785 Hyperlipidemia, unspecified: Secondary | ICD-10-CM | POA: Diagnosis not present

## 2022-06-22 DIAGNOSIS — E1122 Type 2 diabetes mellitus with diabetic chronic kidney disease: Secondary | ICD-10-CM | POA: Diagnosis not present

## 2022-06-22 DIAGNOSIS — I2510 Atherosclerotic heart disease of native coronary artery without angina pectoris: Secondary | ICD-10-CM | POA: Insufficient documentation

## 2022-06-22 DIAGNOSIS — Z79899 Other long term (current) drug therapy: Secondary | ICD-10-CM | POA: Insufficient documentation

## 2022-06-22 LAB — BRAIN NATRIURETIC PEPTIDE: B Natriuretic Peptide: 171 pg/mL — ABNORMAL HIGH (ref 0.0–100.0)

## 2022-06-22 LAB — BASIC METABOLIC PANEL
Anion gap: 11 (ref 5–15)
BUN: 68 mg/dL — ABNORMAL HIGH (ref 8–23)
CO2: 29 mmol/L (ref 22–32)
Calcium: 9 mg/dL (ref 8.9–10.3)
Chloride: 98 mmol/L (ref 98–111)
Creatinine, Ser: 3.22 mg/dL — ABNORMAL HIGH (ref 0.61–1.24)
GFR, Estimated: 18 mL/min — ABNORMAL LOW (ref >60)
Glucose, Bld: 140 mg/dL — ABNORMAL HIGH (ref 70–99)
Potassium: 3.7 mmol/L (ref 3.5–5.1)
Sodium: 138 mmol/L (ref 135–145)

## 2022-06-22 LAB — LIPID PANEL
Cholesterol: 116 mg/dL (ref 0–200)
HDL: 36 mg/dL — ABNORMAL LOW (ref >40)
LDL Cholesterol: 60 mg/dL (ref 0–99)
Total CHOL/HDL Ratio: 3.2 RATIO
Triglycerides: 98 mg/dL (ref <150)
VLDL: 20 mg/dL (ref 0–40)

## 2022-06-22 MED ORDER — FUROSCIX 80 MG/10ML ~~LOC~~ CTKT: 80.0000 mg | CARTRIDGE | SUBCUTANEOUS | 0 refills | Status: DC

## 2022-06-22 MED ORDER — NITROGLYCERIN 0.4 MG/SPRAY TL SOLN: 1.0000 | 0 refills | Status: DC

## 2022-06-22 NOTE — Patient Instructions (Signed)
Routine lab work today. Will notify you of abnormal results  Follow up in 2 months  Do the following things EVERYDAY: Weigh yourself in the morning before breakfast. Write it down and keep it in a log. Take your medicines as prescribed Eat low salt foods--Limit salt (sodium) to 2000 mg per day.  Stay as active as you can everyday Limit all fluids for the day to less than 2 liters

## 2022-06-22 NOTE — Progress Notes (Signed)
ADVANCED HF CLINIC NOTE  [id number], Jesse Sans, MD Primary Cardiologist: Ida Rogue, MD   HPI:  Kyle Roberson is an 82 y.o. 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   Presented to ED for for evaluation on 11/19. BNP 263, Scr 2.69 (baseline Scr had been variable, ranging 2-2.5), . HS troponin 22>21. CXR with evidence of CHF. He was given IV lasix and admitted for acute on chronic CHF. Cardiology consulted. He has been diuresing with lasix gtt and metolazone, currently negative 15L since admit. Scr trended up to 3.06 several days ago >> now improved to 2.72. Lasix gtt had been cut back to 4 mg/hr with decrease in UOP, lasix gtt subsequently increased back to 8 mg/hr this am. Remains persistently volume overloaded. Nephrology and Advanced Heart Failure asked to evaluate patient.   Echo 11/23 EF 55-60% RV moderately dilated/moderate HK. Mild AS RVSP 90mmHG. Has never had RHC   Was admitted in 11/23 with massive volume overloaded. Diuresed 50 pounds. D/c weight 220.   Here significant other Jeannene Patella) - they got married 05/23/22. Says he is doing pretty well. Shingles resolved. Weight stable at 230 at home. (Previous range 282-232). Takes lasix 40 daily and will increase to 80 as needed. If no response to doubling po lasix then will use Furoscix prn. Watches salt closely. Can do ADLs without much problem. No CP. Occasional edema in RLE. Unable to use compression hose due to pain.    ROS: All systems negative except as listed in HPI, PMH and Problem List.  SH:  Social History   Socioeconomic History   Marital status: Married    Spouse name: Not on file   Number of children: Not on file   Years of education: Not on file   Highest education level: Not on file  Occupational History   Not on file  Tobacco Use   Smoking status: Former   Smokeless tobacco: Never   Vaping Use   Vaping Use: Never used  Substance and Sexual Activity   Alcohol use: Not Currently    Alcohol/week: 2.0 standard drinks of alcohol    Types: 2 Glasses of wine per week   Drug use: Not Currently   Sexual activity: Not on file  Other Topics Concern   Not on file  Social History Narrative   Lives at home with wife   Social Determinants of Health   Financial Resource Strain: Not on file  Food Insecurity: No Food Insecurity (02/11/2022)   Hunger Vital Sign    Worried About Running Out of Food in the Last Year: Never true    Ran Out of Food in the Last Year: Never true  Transportation Needs: No Transportation Needs (02/11/2022)   PRAPARE - Hydrologist (Medical): No    Lack of Transportation (Non-Medical): No  Physical Activity: Not on file  Stress: Not on file  Social Connections: Not on file  Intimate Partner Violence: Not At Risk (02/11/2022)   Humiliation, Afraid, Rape, and Kick questionnaire    Fear of Current or Ex-Partner: No    Emotionally Abused: No    Physically Abused: No    Sexually Abused: No    FH:  Family History  Problem Relation Age of Onset   Pancreatic cancer Mother    CAD Father    Diabetes Brother     Past Medical History:  Diagnosis Date   AKI (acute kidney injury) 07/06/2018   CAD (coronary artery disease)    a. Remote PCI/stenting to LAD w PTCA Diagnoal. RCA 60%; b.  2005/2008 Cardiolites w/ reportedly mild ischemia in Diag territory-->Med rx.   Cellulitis of lower extremity 05/31/2019   Chronic heart failure with preserved ejection fraction (HFpEF)    a. 04/2019 Echo: EF 55-60%, no rwma, nl RV fxn, RVSP 55.24mmHg. Mod dil LA. Mild-mod MR. Mod dil PA.   CKD (chronic kidney disease), stage IV    Diabetes    GERD (gastroesophageal reflux disease)    History of MI (myocardial infarction) 06/27/2014   HLD (hyperlipidemia)    HTN (hypertension)    PAH (pulmonary artery hypertension)    a. 04/2019 Echo: RVSP  55.75mmHg.   Permanent atrial fibrillation    a. CHA2DS2VASc = 5-->dose adjusted eliquis (age/creat).   Subdural hematoma    a. 01/2021 in setting of fall s/p L frontotemporal craniotomy.    Current Outpatient Medications  Medication Sig Dispense Refill   Ascorbic Acid (VITAMIN C PO) Take 1 tablet by mouth in the morning.     Cholecalciferol (VITAMIN D3) 1.25 MG (50000 UT) CAPS Take 1 tablet by mouth in the morning.     Coenzyme Q10-Vitamin E (QUNOL ULTRA COQ10 PO) Take 1 capsule by mouth in the morning.     dorzolamide-timolol (COSOPT) 22.3-6.8 MG/ML ophthalmic solution Place 1 drop into the left eye 2 (two) times daily.     doxazosin (CARDURA) 1 MG tablet Take 1 tablet (1 mg total) by mouth 2 (two) times daily. 180 tablet 0   ELIQUIS 2.5 MG TABS tablet TAKE 1 TABLET(2.5 MG) BY MOUTH TWICE DAILY 180 tablet 1   Flaxseed, Linseed, (FLAXSEED OIL PO) Take 1 capsule by mouth in the morning.     Furosemide (FUROSCIX) 80 MG/10ML CTKT Inject 80 mg into the skin as directed. 5 each 0   furosemide (LASIX) 40 MG tablet Take 1 tablet daily every other day, alternating with 2 tabs daily every other day 45 tablet 3   glipiZIDE (GLUCOTROL XL) 10 MG 24 hr tablet Take 10 mg by mouth daily with breakfast.     hydrALAZINE (APRESOLINE) 100 MG tablet 100 mg three times a day, with extra 100 mg as needed for high pressure. 360 tablet 3   isosorbide mononitrate (IMDUR) 30 MG 24 hr tablet Take 30 mg by mouth in the morning, at noon, and at bedtime.     ketorolac (ACULAR) 0.5 % ophthalmic solution Place 1 drop into the left eye 4 (four) times daily. 5 mL 0   Multiple Vitamin (MULTIVITAMIN WITH MINERALS) TABS tablet Take 1 tablet by mouth in the morning.     nitroGLYCERIN (NITROLINGUAL) 0.4 MG/SPRAY spray Place 1 spray under the tongue as directed. 12 g 0   Omega-3 Fatty Acids (FISH OIL PO) Take 1 capsule by mouth in the morning.     potassium chloride SA (KLOR-CON M) 20 MEQ tablet Take 20 mEq by mouth once a week.  2-3 times a week     rosuvastatin (CRESTOR) 10 MG tablet Take 1 tablet (10 mg total) by mouth 3 (three) times a week. 45 tablet 3   Turmeric (QC TUMERIC COMPLEX PO) Take 1 capsule by mouth in the morning. Qunol Turmeric Curcumin- 40 MG     No current facility-administered medications for this visit.    Vitals:   06/22/22 1503  BP: (!) 134/54  Pulse: 61  SpO2: 97%  Weight: 236  lb (107 kg)    PHYSICAL EXAM:  General:  Sitting in chair. No resp difficulty HEENT: normal Neck: supple.JVP 8-9 prominent v-waves . Carotids 2+ bilat; no bruits. No lymphadenopathy or thryomegaly appreciated. Cor: PMI nondisplaced. Irregular rate & rhythm. 2/6 TR Lungs: clear Abdomen: obese soft, nontender, nondistended. No hepatosplenomegaly. No bruits or masses. Good bowel sounds. 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 moves all 4 extremities w/o difficulty. Affect pleasant   ASSESSMENT & PLAN:  Chronic diastolic CHF with prominent R-sided HF:  Echo 02/21: EF 55-60%, RV okay, RVSP 56 mmHg, dilated pulmonary artery, dilated IVC -Echo 11/23: EF 55-60%, interventricular septum flattened in systole consistent with RV pressure overload, RV function okay, RVSP severely elevated 83 mmHg, mild MR, moderate TR, dilated pulmonary artery, dilated IVC - Improving slowly NYHA II-III - Volume status mildly continue lasix to 80 daily alternating with 40 daily. Needs Furoscix for breakthrough periods when oral diuretics are failing. Doing very well with sliding scale - Avoiding SGLT2i with obesity and poor hygiene - Continue hydralazine 100 mg TID + imdur 30 daily - GDMT and tight volume control limited by renal impairment - Wrap legs with ACE wraps - Repeat labs today   2. 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 Improved with diuresis - consider sleep study/ONOX  - doubt he will comply with CPAP but may benefit from nocturnal O2 - I do not think RHC will change our management at this point - No change   3. CAD: -Hx remote stenting to LAD and diagonal - No s/s angina - Not candidate for cath with CKD IV -Continue statin -No aspirin since anticoagulated   4. Permanent atrial fibrillation: - Rate 50s without rate control meds - On Eliquis 2.5 mg BID (appropriate dose for age and renal function) - No bleeding   5. CKD IV: -Scr variable, has been averaging 2-2.5. New baseline 2.5-3.0  -Suspect cardiorenal -Nephrology following as outpatient - labs today   6. HTN: -Options limited by renal impairment -With CKD IV would not push BP < 120  7. DM2 - followed by PCP.  - Check HgBA1c today at his request  Glori Bickers, MD  3:38 PM

## 2022-06-23 LAB — HEMOGLOBIN A1C
Hgb A1c MFr Bld: 7.3 % — ABNORMAL HIGH (ref 4.8–5.6)
Mean Plasma Glucose: 163 mg/dL

## 2022-07-03 ENCOUNTER — Other Ambulatory Visit: Payer: Self-pay | Admitting: *Deleted

## 2022-07-03 MED ORDER — FUROSCIX 80 MG/10ML ~~LOC~~ CTKT: 80.0000 mg | CARTRIDGE | SUBCUTANEOUS | 0 refills | Status: DC

## 2022-07-04 ENCOUNTER — Other Ambulatory Visit: Payer: Self-pay | Admitting: Cardiovascular Disease

## 2022-07-06 ENCOUNTER — Other Ambulatory Visit: Payer: Self-pay | Admitting: Cardiovascular Disease

## 2022-07-06 NOTE — Telephone Encounter (Signed)
Please advise if ok to refill historical medication. ? ?

## 2022-07-07 ENCOUNTER — Telehealth: Payer: Self-pay

## 2022-07-07 ENCOUNTER — Other Ambulatory Visit (HOSPITAL_COMMUNITY): Payer: Self-pay

## 2022-07-07 NOTE — Telephone Encounter (Signed)
Furoscix Direct Prescription start form re-sent. [phone])

## 2022-07-10 ENCOUNTER — Telehealth: Payer: Self-pay

## 2022-07-10 MED ORDER — ISOSORBIDE MONONITRATE ER 30 MG PO TB24: 30.0000 mg | ORAL_TABLET | Freq: Three times a day (TID) | ORAL | 6 refills | Status: DC

## 2022-07-10 NOTE — Telephone Encounter (Signed)
Please review for refill. When patient last saw Dr. Mariah Milling in October 2023, he was taking isosorbide  3 tablets daily. Directions were since changed to 1 tablet in AM, 1 tablet in afternoon, and 1 tablet in evening. However per Dr. Prescott Gum last office note on 06/22/22 it states  daily. Thank you!  Chronic diastolic CHF with prominent R-sided HF:  - Continue hydralazine 100 mg TID + imdur 30 daily    *STAT* If patient is at the pharmacy, call can be transferred to refill team.  1. Which medications need to be refilled? (please list name of each medication and dose if known) isosorbide  2. Which pharmacy/location (including street and city if local pharmacy) is medication to be sent to? [facility] [place], [place]  3. Do they need a 30 day or 90 day supply? 90

## 2022-07-10 NOTE — Telephone Encounter (Signed)
Refill sent in for Imdur 30 mg TID as that is what pt's has been on for years

## 2022-07-10 NOTE — Addendum Note (Signed)
Addended by: Noralee Space on: 07/10/2022 01:52 PM   Modules accepted: Orders

## 2022-07-27 ENCOUNTER — Ambulatory Visit (INDEPENDENT_AMBULATORY_CARE_PROVIDER_SITE_OTHER): Payer: Medicare HMO | Admitting: Internal Medicine

## 2022-07-27 ENCOUNTER — Encounter: Payer: Self-pay | Admitting: Internal Medicine

## 2022-07-27 VITALS — BP 128/70 | HR 50 | Ht 68.0 in | Wt 241.0 lb

## 2022-07-27 DIAGNOSIS — H5440 Blindness, one eye, unspecified eye: Secondary | ICD-10-CM

## 2022-07-27 DIAGNOSIS — E1122 Type 2 diabetes mellitus with diabetic chronic kidney disease: Secondary | ICD-10-CM | POA: Diagnosis not present

## 2022-07-27 DIAGNOSIS — I5033 Acute on chronic diastolic (congestive) heart failure: Secondary | ICD-10-CM

## 2022-07-27 DIAGNOSIS — Z794 Long term (current) use of insulin: Secondary | ICD-10-CM

## 2022-07-27 DIAGNOSIS — N2581 Secondary hyperparathyroidism of renal origin: Secondary | ICD-10-CM

## 2022-07-27 DIAGNOSIS — N184 Chronic kidney disease, stage 4 (severe): Secondary | ICD-10-CM

## 2022-07-27 MED ORDER — GLIPIZIDE ER 5 MG PO TB24: 5.0000 mg | ORAL_TABLET | Freq: Every day | ORAL | 1 refills | Status: DC

## 2022-07-27 NOTE — Assessment & Plan Note (Signed)
Followed closely by Cardiology Balancing diuresis with CKD values

## 2022-07-27 NOTE — Assessment & Plan Note (Signed)
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

## 2022-07-27 NOTE — Assessment & Plan Note (Signed)
Being followed closely by Nephrology.

## 2022-07-27 NOTE — Assessment & Plan Note (Addendum)
Blood sugars labile with large swings and several episodes of hypoglycemia treated at home with OJ. Currently being treated with glipizide 10 mg.  Will reduce the dose to 5 mg. Lab Results  Component Value Date   HGBA1C 7.3 (H) 06/22/2022  Sample of FreeStyle libre given - call for Rx if helpful. Follow up in three months.

## 2022-07-27 NOTE — Progress Notes (Signed)
Date:  07/27/2022   Name:  Kyle Roberson   DOB:  Jan 17, 1941   MRN:  [id number]   Chief Complaint: Diabetes  Diabetes He presents for his follow-up diabetic visit. He has type 2 diabetes mellitus. His disease course has been stable. Hypoglycemia symptoms include nervousness/anxiousness and sweats. Pertinent negatives for hypoglycemia include no dizziness or headaches. Pertinent negatives for diabetes include no chest pain and no fatigue. Hypoglycemia complications include nocturnal hypoglycemia. Current diabetic treatment includes oral agent (monotherapy) (glipizide.). He is compliant with treatment all of the time ([name] has been trying to adjust the dose or hold it at times.).  He has had several symptomatic blood sugars into the 60's over the past few months.  He is not eating much, limiting his sodium.   Glipizide 10 mg per day in the AM.  No insulin.  BS have huge swings from > 250 in the evening then down to 120 in the AM.    Lab Results  Component Value Date   NA 138 06/22/2022   K 3.7 06/22/2022   CO2 29 06/22/2022   GLUCOSE 140 (H) 06/22/2022   BUN 68 (H) 06/22/2022   CREATININE 3.22 (H) 06/22/2022   CALCIUM 9.0 06/22/2022   EGFR 29 (L) 07/16/2020   GFRNONAA 18 (L) 06/22/2022   Lab Results  Component Value Date   CHOL 116 06/22/2022   HDL 36 (L) 06/22/2022   LDLCALC 60 06/22/2022   TRIG 98 06/22/2022   CHOLHDL 3.2 06/22/2022   Lab Results  Component Value Date   TSH 2.538 05/05/2021   Lab Results  Component Value Date   HGBA1C 7.3 (H) 06/22/2022   Lab Results  Component Value Date   WBC 5.7 03/16/2022   HGB 12.5 (L) 03/16/2022   HCT 39.8 03/16/2022   MCV 93.6 03/16/2022   PLT 153 03/16/2022   Lab Results  Component Value Date   ALT 28 03/16/2022   AST 25 03/16/2022   ALKPHOS 72 03/16/2022   BILITOT 1.1 03/16/2022   No results found for: "25OHVITD2", "25OHVITD3", "VD25OH"   Review of Systems  Constitutional:  Negative for chills, fatigue and fever.   HENT:  Negative for trouble swallowing.   Eyes:  Positive for visual disturbance.  Respiratory:  Negative for chest tightness and shortness of breath.   Cardiovascular:  Positive for leg swelling. Negative for chest pain and palpitations.  Neurological:  Negative for dizziness and headaches.  Psychiatric/Behavioral:  Negative for dysphoric mood and sleep disturbance. The patient is nervous/anxious.     Patient Active Problem List   Diagnosis Date Noted   Pulmonary hypertension, unspecified (HCC) 02/19/2022   Acute renal failure with acute tubular necrosis superimposed on stage 4 chronic kidney disease (HCC) 02/19/2022   Permanent atrial fibrillation (HCC) 02/11/2022   Paroxysmal atrial fibrillation (HCC) 02/09/2022   Dyslipidemia 02/09/2022   Depression 02/09/2022   Elevated troponin 02/09/2022   Thyroid nodule 06/04/2021   Diabetic peripheral neuropathy (HCC)    History of subdural hemorrhage 02/04/2021   Hyperlipidemia associated with type 2 diabetes mellitus (HCC)    Acute on chronic right-sided heart failure (HCC) 05/07/2019   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 04/28/2019   CKD (chronic kidney disease) stage 4, GFR 15-29 ml/min (HCC) 04/27/2019   Acute on chronic heart failure with preserved ejection fraction (HFpEF) (HCC) 04/27/2019   Morbid obesity (HCC) 04/27/2019   Lymphedema 03/23/2019   Erectile dysfunction 08/01/2018   History of colonic diverticulitis 08/01/2018  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   Venous insufficiency 08/01/2018   Persistent atrial fibrillation (HCC) 08/01/2018   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   Essential hypertension 07/06/2018   Type 2 diabetes mellitus with stage 4 chronic kidney disease, with long-term current use of insulin (HCC) 07/06/2018   Coronary artery disease involving native coronary artery of native heart without angina pectoris 06/05/2018   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/30/2017    Vitamin D deficiency 01/30/2017   Benign non-nodular prostatic hyperplasia with lower urinary tract symptoms 09/29/2014   Nephrolithiasis 01/04/2012    Allergies  Allergen Reactions   Atorvastatin Hives, Itching and Other (See Comments)    Other reaction(s): Other (See Comments)   Simvastatin Hives, Itching and Other (See Comments)    Other reaction(s): Other (See Comments)    Past Surgical History:  Procedure Laterality Date   BLEPHAROPLASTY     CARDIAC CATHETERIZATION     CORONARY STENT INTERVENTION     CRANIOTOMY Left 02/04/2021   Procedure: CRANIOTOMY FOR LEFT SUBDURAL  HEMATOMA EVACUATION;  Surgeon: Lucy Chris, MD;  Location: [facility] ORS;  Service: Neurosurgery;  Laterality: Left;   CYSTOSCOPY     HERNIA REPAIR      Social History   Tobacco Use   Smoking status: Former   Smokeless tobacco: Never  Building services engineer Use: Never used  Substance Use Topics   Alcohol use: Not Currently    Alcohol/week: 2.0 standard drinks of alcohol    Types: 2 Glasses of wine per week   Drug use: Not Currently     Medication list has been reviewed and updated.  Current Meds  Medication Sig   Ascorbic Acid (VITAMIN C PO) Take 1 tablet by mouth in the morning.   Cholecalciferol (VITAMIN D3) 1.25 MG (50000 UT) CAPS Take 1 tablet by mouth in the morning.   Coenzyme Q10-Vitamin E (QUNOL ULTRA COQ10 PO) Take 1 capsule by mouth in the morning.   dorzolamide-timolol (COSOPT) 22.3-6.8 MG/ML ophthalmic solution Place 1 drop into the left eye 2 (two) times daily.   doxazosin (CARDURA) 1 MG tablet TAKE 1 TABLET(1 MG) BY MOUTH TWICE DAILY   ELIQUIS 2.5 MG TABS tablet TAKE 1 TABLET(2.5 MG) BY MOUTH TWICE DAILY   Flaxseed, Linseed, (FLAXSEED OIL PO) Take 1 capsule by mouth in the morning.   Furosemide (FUROSCIX) 80 MG/10ML CTKT Inject 80 mg into the skin as directed.   furosemide (LASIX) 40 MG tablet Take 1 tablet daily every other day, alternating with 2 tabs daily every other day (Patient  taking differently: Take 20 mg by mouth daily. Take 1 tablet daily every other day, alternating with 2 tabs daily every other day)   glipiZIDE (GLUCOTROL XL) 5 MG 24 hr tablet Take 1 tablet (5 mg total) by mouth daily with breakfast.   hydrALAZINE (APRESOLINE) 100 MG tablet TAKE 1 TABLET BY MOUTH THREE TIMES DAILY, WITH EXTRA 100 MG AS NEEDED FOR HIGH PRESSURE   isosorbide mononitrate (IMDUR) 30 MG 24 hr tablet Take 1 tablet (30 mg total) by mouth in the morning, at noon, and at bedtime.   ketorolac (ACULAR) 0.5 % ophthalmic solution Place 1 drop into the left eye 4 (four) times daily.   Multiple Vitamin (MULTIVITAMIN WITH MINERALS) TABS tablet Take 1 tablet by mouth in the morning.   nitroGLYCERIN (NITROLINGUAL) 0.4 MG/SPRAY spray Place 1 spray under the tongue as directed.   Omega-3 Fatty Acids (FISH OIL PO) Take 1 capsule by mouth in the morning.   rosuvastatin (CRESTOR)  10 MG tablet Take 1 tablet (10 mg total) by mouth 3 (three) times a week.   Turmeric (QC TUMERIC COMPLEX PO) Take 1 capsule by mouth in the morning. Qunol Turmeric Curcumin- 40 MG   [DISCONTINUED] glipiZIDE (GLUCOTROL XL) 10 MG 24 hr tablet Take 10 mg by mouth daily with breakfast.       07/27/2022    4:25 PM 12/02/2021    3:57 PM 05/05/2021    3:59 PM 06/18/2020   10:30 AM  GAD 7 : Generalized Anxiety Score  Nervous, Anxious, on Edge 0 0 0 0  Control/stop worrying 0 0 0 0  Worry too much - different things 0 0 0 0  Trouble relaxing 0 0 0 0  Restless 0 0 0 0  Easily annoyed or irritable 0 1 0 0  Afraid - awful might happen 0 0 0 0  Total GAD 7 Score 0 1 0 0  Anxiety Difficulty Not difficult at all Somewhat difficult Not difficult at all        07/27/2022    4:24 PM 12/02/2021    3:57 PM 05/05/2021    3:59 PM  Depression screen PHQ 2/9  Decreased Interest 0 1 0  Down, Depressed, Hopeless 0 1 0  PHQ - 2 Score 0 2 0  Altered sleeping 3 0 0  Tired, decreased energy 0 2 0  Change in appetite 0 1 0  Feeling bad or  failure about yourself  0 1 0  Trouble concentrating 0 1 0  Moving slowly or fidgety/restless 0 0 0  Suicidal thoughts 0 0 0  PHQ-9 Score 3 7 0  Difficult doing work/chores Not difficult at all Very difficult Not difficult at all    BP Readings from Last 3 Encounters:  07/27/22 128/70  06/22/22 (!) 134/54  05/12/22 124/62    Physical Exam Constitutional:      Appearance: He is obese.  Cardiovascular:     Rate and Rhythm: Normal rate and regular rhythm.  Pulmonary:     Effort: Pulmonary effort is normal.     Breath sounds: Normal breath sounds. No wheezing.  Musculoskeletal:        General: Swelling present.     Cervical back: Normal range of motion.  Neurological:     Mental Status: He is alert and oriented to person, place, and time.     Gait: Gait abnormal (in wheel chair today).  Psychiatric:        Mood and Affect: Mood normal.        Behavior: Behavior normal.     Wt Readings from Last 3 Encounters:  07/27/22 241 lb (109.3 kg)  06/22/22 236 lb (107 kg)  05/12/22 235 lb 8 oz (106.8 kg)    BP 128/70   Pulse (!) 50   Ht 5\' 8"  (1.727 m)   Wt 241 lb (109.3 kg)   SpO2 97%   BMI 36.64 kg/m   Assessment and Plan:  Problem List Items Addressed This Visit       Cardiovascular and Mediastinum   Acute on chronic heart failure with preserved ejection fraction (HFpEF) (HCC)    Followed closely by Cardiology Balancing diuresis with CKD values        Endocrine   Type 2 diabetes mellitus with stage 4 chronic kidney disease, with long-term current use of insulin (HCC) - Primary    Blood sugars labile with large swings and several episodes of hypoglycemia treated at home with OJ. Currently being treated  with glipizide 10 mg.  Will reduce the dose to 5 mg. Lab Results  Component Value Date   HGBA1C 7.3 (H) 06/22/2022  Sample of FreeStyle libre given - call for Rx if helpful. Follow up in three months.       Relevant Medications   glipiZIDE (GLUCOTROL XL) 5 MG  24 hr tablet   Other Relevant Orders   Microalbumin / creatinine urine ratio   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HCC) (Chronic)    Being followed closely by Nephrology.        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   Partially dictated using Dragon software, any errors are not intentional.  [name], MD [facility] [place], [place]

## 2022-07-28 LAB — MICROALBUMIN / CREATININE URINE RATIO
Creatinine, Urine: 77.7 mg/dL
Microalb/Creat Ratio: 275 mg/g creat — ABNORMAL HIGH (ref 0–29)
Microalbumin, Urine: 213.9 ug/mL

## 2022-08-23 NOTE — Progress Notes (Signed)
ADVANCED HF CLINIC NOTE  UJW:[id number], Nyoka Cowden, MD Primary Cardiologist: Julien Nordmann, MD   HPI:  Kyle Roberson is an 82 y.o. 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   Presented to ED for for evaluation on 11/19. BNP 263, Scr 2.69 (baseline Scr had been variable, ranging 2-2.5), . HS troponin 22>21. CXR with evidence of CHF. He was given IV lasix and admitted for acute on chronic CHF. Cardiology consulted. He has been diuresing with lasix gtt and metolazone, currently negative 15L since admit. Scr trended up to 3.06 several days ago >> now improved to 2.72. Lasix gtt had been cut back to 4 mg/hr with decrease in UOP, lasix gtt subsequently increased back to 8 mg/hr this am. Remains persistently volume overloaded. Nephrology and Advanced Heart Failure asked to evaluate patient.   Echo 11/23 EF 55-60% RV moderately dilated/moderate HK. Mild AS RVSP . Has never had RHC   Was admitted in 11/23 with massive volume overloaded. Diuresed 50 pounds. D/c weight 220.   Here with his wife [name]). Says fluid is steadily going up. Typically on lasix 40 daily but has taken lasix 80 3x last week. Weight up 6 pounds. Becoming more mobile. Able to do ADLs easier.  Still with some swelling. Weight at home 238-239 (baseline 233-234). Took Furoscix yesterday and only got 1 pound out (usually 2-3 pounds) Watching fluid closely.   In April Scr was up 2.75 -> 3.22 and BNP down 195 -> 171   ROS: All systems negative except as listed in HPI, PMH and Problem List.  SH:  Social History   Socioeconomic History   Marital status: Married    Spouse name: Not on file   Number of children: Not on file   Years of education: Not on file   Highest education level: Not on file  Occupational History   Not on file  Tobacco Use   Smoking status: Former   Smokeless tobacco: Never   Vaping Use   Vaping Use: Never used  Substance and Sexual Activity   Alcohol use: Not Currently    Alcohol/week: 2.0 standard drinks of alcohol    Types: 2 Glasses of wine per week   Drug use: Not Currently   Sexual activity: Not on file  Other Topics Concern   Not on file  Social History Narrative   Lives at home with wife   Social Determinants of Health   Financial Resource Strain: Not on file  Food Insecurity: No Food Insecurity (02/11/2022)   Hunger Vital Sign    Worried About Running Out of Food in the Last Year: Never true    Ran Out of Food in the Last Year: Never true  Transportation Needs: No Transportation Needs (02/11/2022)   PRAPARE - Administrator, Civil Service (Medical): No    Lack of Transportation (Non-Medical): No  Physical Activity: Not on file  Stress: Not on file  Social Connections: Not on file  Intimate Partner Violence: Not At Risk (02/11/2022)   Humiliation, Afraid, Rape, and Kick questionnaire    Fear of Current or Ex-Partner: No    Emotionally Abused: No    Physically Abused: No    Sexually Abused: No    FH:  Family History  Problem Relation Age of Onset   Pancreatic cancer Mother    CAD Father    Diabetes Brother  Past Medical History:  Diagnosis Date   AKI (acute kidney injury) (HCC) 07/06/2018   CAD (coronary artery disease)    a. Remote PCI/stenting to LAD w PTCA Diagnoal. RCA 60%; b.  2005/2008 Cardiolites w/ reportedly mild ischemia in Diag territory-->Med rx.   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 04/2019 Echo: EF 55-60%, no rwma, nl RV fxn, RVSP 55.85mmHg. Mod dil LA. Mild-mod MR. Mod dil PA.   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 04/2019 Echo: RVSP 55.65mmHg.   Permanent atrial fibrillation (HCC)    a. CHA2DS2VASc = 5-->dose adjusted eliquis (age/creat).   Subdural hematoma (HCC)    a. 01/2021 in setting of fall s/p L frontotemporal craniotomy.    Current Outpatient Medications  Medication Sig Dispense Refill   Ascorbic Acid (VITAMIN C PO) Take 1 tablet by mouth in the morning.     Cholecalciferol (VITAMIN D3) 1.25 MG (50000 UT) CAPS Take 1 tablet by mouth in the morning.     Coenzyme Q10-Vitamin E (QUNOL ULTRA COQ10 PO) Take 1 capsule by mouth in the morning.     dorzolamide-timolol (COSOPT) 22.3-6.8 MG/ML ophthalmic solution Place 1 drop into the left eye 2 (two) times daily.     doxazosin (CARDURA) 1 MG tablet TAKE 1 TABLET(1 MG) BY MOUTH TWICE DAILY 180 tablet 0   ELIQUIS 2.5 MG TABS tablet TAKE 1 TABLET(2.5 MG) BY MOUTH TWICE DAILY 180 tablet 1   Flaxseed, Linseed, (FLAXSEED OIL PO) Take 1 capsule by mouth in the morning.     Furosemide (FUROSCIX) 80 MG/10ML CTKT Inject 80 mg into the skin as directed. 5 each 0   furosemide (LASIX) 40 MG tablet Take 1 tablet daily every other day, alternating with 2 tabs daily every other day (Patient taking differently: Take 20 mg by mouth daily. Take 1 tablet daily every other day, alternating with 2 tabs daily every other day) 45 tablet 3   glipiZIDE (GLUCOTROL XL) 5 MG 24 hr tablet Take 1 tablet (5 mg total) by mouth daily with breakfast. 90 tablet 1   hydrALAZINE (APRESOLINE) 100 MG tablet TAKE 1 TABLET BY MOUTH THREE TIMES DAILY, WITH EXTRA 100 MG AS NEEDED FOR HIGH PRESSURE 270 tablet 0   isosorbide mononitrate (IMDUR) 30 MG 24 hr tablet Take 1 tablet (30 mg total) by mouth in the morning, at noon, and at bedtime. 90 tablet 6   ketorolac (ACULAR) 0.5 % ophthalmic solution Place 1 drop into the left eye 4 (four) times daily. 5 mL 0   Multiple Vitamin (MULTIVITAMIN WITH MINERALS) TABS tablet Take 1 tablet by mouth in the morning.     nitroGLYCERIN (NITROLINGUAL)  0.4 MG/SPRAY spray Place 1 spray under the tongue as directed. 12 g 0   Omega-3 Fatty Acids (FISH OIL PO) Take 1 capsule by mouth in the morning.     rosuvastatin (CRESTOR) 10 MG tablet Take 1 tablet (10 mg total) by mouth 3 (three) times a week. 45 tablet 3   Turmeric (QC TUMERIC COMPLEX PO) Take 1 capsule by mouth in the morning. Qunol Turmeric Curcumin- 40 MG     No current facility-administered medications for this visit.    Vitals:  08/24/22 1416  BP: (!) 141/58  Pulse: (!) 50  SpO2: 95%  Weight: 242 lb (109.8 kg)  Height: 5\' 8"  (1.727 m)   Wt Readings from Last 3 Encounters:  08/24/22 242 lb (109.8 kg)  07/27/22 241 lb (109.3 kg)  06/22/22 236 lb (107 kg)     PHYSICAL EXAM:  General:  Sitting in chair No resp difficulty HEENT: normal x for left eye ectropion Neck: supple. no JVD. Carotids 2+ bilat; no bruits. No lymphadenopathy or thryomegaly appreciated. Cor: PMI nondisplaced. Irregular rate & rhythm. No rubs, gallops or murmurs. Lungs: clear Abdomen: obese soft, nontender, nondistended. No hepatosplenomegaly. No bruits or masses. Good bowel sounds. Extremities: no cyanosis, clubbing, rash, severe chronic lymphedema Neuro: alert & orientedx3, cranial nerves grossly intact. moves all 4 extremities w/o difficulty. Affect pleasant  ReDS 37%  ASSESSMENT & PLAN:  Chronic diastolic CHF with prominent R-sided HF:  Echo 02/21: EF 55-60%, RV okay, RVSP 56 mmHg, dilated pulmonary artery, dilated IVC -Echo 11/23: EF 55-60%, interventricular septum flattened in systole consistent with RV pressure overload, RV function okay, RVSP severely elevated 83 mmHg, mild MR, moderate TR, dilated pulmonary artery, dilated IVC - Improved NYHA II-III - Volume status hard to assess but looks good despite weight gain. ReDS ok at 37% - Continue lasix 80 daily alternating with 40 daily. Will continue Furoscix for breakthrough periods when oral diuretics are failing. Doing very well with sliding  scale - Avoiding SGLT2i with obesity and poor hygiene - Continue hydralazine 100 mg TID + imdur 30 daily - GDMT and tight volume control limited by renal impairment - Continue to wrap legs with ACE wraps - Repeat labs today - If more symptomatic or weight continues to go up will need RHC   2. 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 Improved with diuresis - consider sleep study/ONOX  - doubt he will comply with CPAP but may benefit from nocturnal O2 - As above consider RHC for weight gain or increasing sx   3. CAD: - Hx remote stenting to LAD and diagonal - No s/s angina - Not candidate for cath with CKD IV -Continue statin -No aspirin since anticoagulated   4. Permanent atrial fibrillation: - Rate 50s without rate control meds - On Eliquis 2.5 mg BID (appropriate dose for age and renal function) - No bleeding   5. CKD IV: -Scr variable, has been averaging 2-2.5. New baseline 2.5-3.2  -Suspect cardiorenal -Nephrology following as outpatient -Labs today   6. HTN: -Options limited by renal impairment -With CKD IV would not push BP < 120 - Stable today  7. DM2 - followed by PCP.  - Check HgBA1c 7.3 on 06/22/22  Arvilla Meres, MD  2:32 PM

## 2022-08-24 ENCOUNTER — Ambulatory Visit (HOSPITAL_BASED_OUTPATIENT_CLINIC_OR_DEPARTMENT_OTHER): Payer: Medicare HMO | Admitting: Internal Medicine

## 2022-08-24 ENCOUNTER — Other Ambulatory Visit
Admission: RE | Admit: 2022-08-24 | Discharge: 2022-08-24 | Disposition: A | Discharge status: Home / Self Care | Payer: Medicare HMO | Source: Ambulatory Visit | Attending: Internal Medicine | Admitting: Internal Medicine

## 2022-08-24 VITALS — BP 141/58 | HR 50 | Ht 68.0 in | Wt 242.0 lb

## 2022-08-24 DIAGNOSIS — I482 Chronic atrial fibrillation, unspecified: Secondary | ICD-10-CM

## 2022-08-24 DIAGNOSIS — I5033 Acute on chronic diastolic (congestive) heart failure: Secondary | ICD-10-CM

## 2022-08-24 DIAGNOSIS — N184 Chronic kidney disease, stage 4 (severe): Secondary | ICD-10-CM

## 2022-08-24 DIAGNOSIS — I5032 Chronic diastolic (congestive) heart failure: Secondary | ICD-10-CM | POA: Diagnosis not present

## 2022-08-24 DIAGNOSIS — I10 Essential (primary) hypertension: Secondary | ICD-10-CM

## 2022-08-24 LAB — BASIC METABOLIC PANEL
Anion gap: 12 (ref 5–15)
BUN: 70 mg/dL — ABNORMAL HIGH (ref 8–23)
CO2: 25 mmol/L (ref 22–32)
Calcium: 9.2 mg/dL (ref 8.9–10.3)
Chloride: 103 mmol/L (ref 98–111)
Creatinine, Ser: 3.04 mg/dL — ABNORMAL HIGH (ref 0.61–1.24)
GFR, Estimated: 20 mL/min — ABNORMAL LOW (ref >60)
Glucose, Bld: 143 mg/dL — ABNORMAL HIGH (ref 70–99)
Potassium: 4 mmol/L (ref 3.5–5.1)
Sodium: 140 mmol/L (ref 135–145)

## 2022-08-24 LAB — CBC
HCT: 37.2 % — ABNORMAL LOW (ref 39.0–52.0)
Hemoglobin: 12 g/dL — ABNORMAL LOW (ref 13.0–17.0)
MCH: 30.2 pg (ref 26.0–34.0)
MCHC: 32.3 g/dL (ref 30.0–36.0)
MCV: 93.7 fL (ref 80.0–100.0)
Platelets: 175 10*3/uL (ref 150–400)
RBC: 3.97 MIL/uL — ABNORMAL LOW (ref 4.22–5.81)
RDW: 13.4 % (ref 11.5–15.5)
WBC: 6.6 10*3/uL (ref 4.0–10.5)
nRBC: 0 % (ref 0.0–0.2)

## 2022-08-24 LAB — BRAIN NATRIURETIC PEPTIDE: B Natriuretic Peptide: 170.1 pg/mL — ABNORMAL HIGH (ref 0.0–100.0)

## 2022-08-24 NOTE — Progress Notes (Signed)
   08/24/22 1523  ReDS Vest / Clip  Station Marker B  Ruler Value 32  ReDS Value Range 36 - 40  ReDS Actual Value 37

## 2022-08-24 NOTE — Patient Instructions (Signed)
Medication Changes:  We are refilling your Furoscix to have at home.   Lab Work:  Labs done today, your results will be available in MyChart, we will contact you for abnormal readings.   Special Instructions // Education:  Do the following things EVERYDAY: Weigh yourself in the morning before breakfast. Write it down and keep it in a log. Take your medicines as prescribed Eat low salt foods--Limit salt (sodium) to 2000 mg per day.  Stay as active as you can everyday Limit all fluids for the day to less than 2 liters   Follow-Up in: follow up in 6 weeks with Dr. Gala Romney.    If you have any questions or concerns before your next appointment please send [place] a message through [place] or call our office at [phone] Monday-Friday 8 am-5 pm.   If you have an urgent need after hours on the weekend please call your Primary Cardiologist or the Advanced Heart Failure Clinic in [place] at [phone].

## 2022-09-21 ENCOUNTER — Other Ambulatory Visit: Payer: Self-pay

## 2022-09-21 MED ORDER — FUROSEMIDE 40 MG PO TABS: ORAL_TABLET | ORAL | 3 refills | Status: DC

## 2022-09-22 ENCOUNTER — Ambulatory Visit: Payer: Medicare HMO | Admitting: Cardiovascular Disease

## 2022-10-02 ENCOUNTER — Telehealth: Payer: Self-pay

## 2022-10-02 ENCOUNTER — Other Ambulatory Visit (HOSPITAL_COMMUNITY): Payer: Self-pay

## 2022-10-02 NOTE — Telephone Encounter (Signed)
Pharmacy Patient Advocate Encounter   Received notification from CoverMyMeds that prior authorization for DOXAZOSIN MESYLATE 1 MG TABis required/requested.   Insurance verification completed.   The patient is insured through [employer] .   PA submitted to [employer] via CoverMyMeds Key/confirmation #/EOC [id number] Status is pending

## 2022-10-02 NOTE — Telephone Encounter (Signed)
Pharmacy Patient Advocate Encounter  Received notification from [employer] that Prior Authorization for DOXAZOSIN MESYLATE 1 MG TAB has been APPROVED from 09/02/22 to 10/02/23.Kyle Kitchen  PA #/Case ID/Reference #: [id number]  Copay is $0

## 2022-10-05 ENCOUNTER — Other Ambulatory Visit: Payer: Self-pay | Admitting: Cardiovascular Disease

## 2022-10-07 ENCOUNTER — Encounter: Payer: Medicare HMO | Admitting: Internal Medicine

## 2022-10-27 ENCOUNTER — Ambulatory Visit: Payer: BC Managed Care – PPO | Admitting: Internal Medicine

## 2022-11-18 ENCOUNTER — Encounter: Payer: Medicare HMO | Admitting: Internal Medicine

## 2022-11-19 ENCOUNTER — Telehealth: Payer: Self-pay

## 2022-11-19 NOTE — Telephone Encounter (Signed)
[facility] unable to reach pt about furoscix Rx.  Our office unable to reach pt or pt's spouse by phone. My chart message sent with office number and pharmacy number included.

## 2022-11-23 NOTE — Progress Notes (Deleted)
NO SHOW

## 2022-11-24 ENCOUNTER — Ambulatory Visit: Payer: Medicare HMO | Attending: Cardiovascular Disease | Admitting: Cardiovascular Disease

## 2022-11-24 DIAGNOSIS — I25118 Atherosclerotic heart disease of native coronary 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: Secondary | ICD-10-CM

## 2022-11-24 DIAGNOSIS — N184 Chronic kidney disease, stage 4 (severe): Secondary | ICD-10-CM

## 2022-11-24 DIAGNOSIS — E785 Hyperlipidemia, unspecified: Secondary | ICD-10-CM

## 2022-11-24 DIAGNOSIS — I2510 Atherosclerotic heart disease of native coronary artery without angina pectoris: Secondary | ICD-10-CM

## 2022-11-24 DIAGNOSIS — I10 Essential (primary) hypertension: Secondary | ICD-10-CM

## 2022-11-24 DIAGNOSIS — I5032 Chronic diastolic (congestive) heart failure: Secondary | ICD-10-CM

## 2022-11-24 DIAGNOSIS — I482 Chronic atrial fibrillation, unspecified: Secondary | ICD-10-CM

## 2022-11-26 ENCOUNTER — Encounter: Payer: Self-pay | Admitting: Emergency Medicine

## 2022-11-26 ENCOUNTER — Inpatient Hospital Stay
Admission: EM | Admit: 2022-11-26 | Discharge: 2023-01-04 | DRG: 286 | Disposition: A | Discharge status: Skilled Nursing Facility | Payer: Medicare HMO | Attending: Internal Medicine | Admitting: Internal Medicine

## 2022-11-26 ENCOUNTER — Other Ambulatory Visit: Payer: Self-pay

## 2022-11-26 DIAGNOSIS — E871 Hypo-osmolality and hyponatremia: Secondary | ICD-10-CM | POA: Diagnosis present

## 2022-11-26 DIAGNOSIS — I252 Old myocardial infarction: Secondary | ICD-10-CM

## 2022-11-26 DIAGNOSIS — I890 Lymphedema, not elsewhere classified: Secondary | ICD-10-CM | POA: Diagnosis present

## 2022-11-26 DIAGNOSIS — R140 Abdominal distension (gaseous): Secondary | ICD-10-CM | POA: Diagnosis not present

## 2022-11-26 DIAGNOSIS — R001 Bradycardia, unspecified: Secondary | ICD-10-CM | POA: Diagnosis not present

## 2022-11-26 DIAGNOSIS — E1165 Type 2 diabetes mellitus with hyperglycemia: Secondary | ICD-10-CM | POA: Diagnosis present

## 2022-11-26 DIAGNOSIS — R5381 Other malaise: Secondary | ICD-10-CM | POA: Diagnosis not present

## 2022-11-26 DIAGNOSIS — I272 Pulmonary hypertension, unspecified: Secondary | ICD-10-CM | POA: Diagnosis not present

## 2022-11-26 DIAGNOSIS — J9601 Acute respiratory failure with hypoxia: Secondary | ICD-10-CM | POA: Diagnosis present

## 2022-11-26 DIAGNOSIS — Z6836 Body mass index (BMI) 36.0-36.9, adult: Secondary | ICD-10-CM

## 2022-11-26 DIAGNOSIS — M25572 Pain in left ankle and joints of left foot: Secondary | ICD-10-CM | POA: Diagnosis not present

## 2022-11-26 DIAGNOSIS — L8990 Pressure ulcer of unspecified site, unspecified stage: Secondary | ICD-10-CM | POA: Insufficient documentation

## 2022-11-26 DIAGNOSIS — N186 End stage renal disease: Secondary | ICD-10-CM | POA: Diagnosis present

## 2022-11-26 DIAGNOSIS — Z87820 Personal history of traumatic brain injury: Secondary | ICD-10-CM

## 2022-11-26 DIAGNOSIS — E8779 Other fluid overload: Secondary | ICD-10-CM | POA: Diagnosis present

## 2022-11-26 DIAGNOSIS — I2722 Pulmonary hypertension due to left heart disease: Secondary | ICD-10-CM | POA: Diagnosis present

## 2022-11-26 DIAGNOSIS — D631 Anemia in chronic kidney disease: Secondary | ICD-10-CM | POA: Diagnosis present

## 2022-11-26 DIAGNOSIS — Z8249 Family history of ischemic heart disease and other diseases of the circulatory system: Secondary | ICD-10-CM

## 2022-11-26 DIAGNOSIS — Z955 Presence of coronary angioplasty implant and graft: Secondary | ICD-10-CM

## 2022-11-26 DIAGNOSIS — E8770 Fluid overload, unspecified: Principal | ICD-10-CM

## 2022-11-26 DIAGNOSIS — I4891 Unspecified atrial fibrillation: Secondary | ICD-10-CM | POA: Diagnosis not present

## 2022-11-26 DIAGNOSIS — I4821 Permanent atrial fibrillation: Secondary | ICD-10-CM | POA: Diagnosis present

## 2022-11-26 DIAGNOSIS — I493 Ventricular premature depolarization: Secondary | ICD-10-CM | POA: Diagnosis not present

## 2022-11-26 DIAGNOSIS — Z515 Encounter for palliative care: Secondary | ICD-10-CM | POA: Diagnosis not present

## 2022-11-26 DIAGNOSIS — Z992 Dependence on renal dialysis: Secondary | ICD-10-CM

## 2022-11-26 DIAGNOSIS — I2510 Atherosclerotic heart disease of native coronary artery without angina pectoris: Secondary | ICD-10-CM | POA: Diagnosis present

## 2022-11-26 DIAGNOSIS — I2729 Other secondary pulmonary hypertension: Secondary | ICD-10-CM | POA: Diagnosis present

## 2022-11-26 DIAGNOSIS — K219 Gastro-esophageal reflux disease without esophagitis: Secondary | ICD-10-CM | POA: Diagnosis not present

## 2022-11-26 DIAGNOSIS — I10 Essential (primary) hypertension: Secondary | ICD-10-CM | POA: Diagnosis not present

## 2022-11-26 DIAGNOSIS — Y92238 Other place in hospital as the place of occurrence of the external cause: Secondary | ICD-10-CM | POA: Diagnosis present

## 2022-11-26 DIAGNOSIS — E669 Obesity, unspecified: Secondary | ICD-10-CM | POA: Diagnosis not present

## 2022-11-26 DIAGNOSIS — M7989 Other specified soft tissue disorders: Secondary | ICD-10-CM | POA: Diagnosis present

## 2022-11-26 DIAGNOSIS — E1122 Type 2 diabetes mellitus with diabetic chronic kidney disease: Secondary | ICD-10-CM | POA: Diagnosis present

## 2022-11-26 DIAGNOSIS — Z66 Do not resuscitate: Secondary | ICD-10-CM | POA: Diagnosis present

## 2022-11-26 DIAGNOSIS — R570 Cardiogenic shock: Secondary | ICD-10-CM | POA: Diagnosis not present

## 2022-11-26 DIAGNOSIS — L309 Dermatitis, unspecified: Secondary | ICD-10-CM | POA: Diagnosis present

## 2022-11-26 DIAGNOSIS — Z794 Long term (current) use of insulin: Secondary | ICD-10-CM | POA: Diagnosis not present

## 2022-11-26 DIAGNOSIS — I132 Hypertensive heart and chronic kidney disease with heart failure and with stage 5 chronic kidney disease, or end stage renal disease: Secondary | ICD-10-CM | POA: Diagnosis present

## 2022-11-26 DIAGNOSIS — R54 Age-related physical debility: Secondary | ICD-10-CM | POA: Diagnosis present

## 2022-11-26 DIAGNOSIS — I5033 Acute on chronic diastolic (congestive) heart failure: Secondary | ICD-10-CM | POA: Diagnosis present

## 2022-11-26 DIAGNOSIS — E785 Hyperlipidemia, unspecified: Secondary | ICD-10-CM | POA: Diagnosis present

## 2022-11-26 DIAGNOSIS — W19XXXA Unspecified fall, initial encounter: Secondary | ICD-10-CM | POA: Diagnosis present

## 2022-11-26 DIAGNOSIS — N184 Chronic kidney disease, stage 4 (severe): Secondary | ICD-10-CM | POA: Diagnosis present

## 2022-11-26 DIAGNOSIS — I5032 Chronic diastolic (congestive) heart failure: Secondary | ICD-10-CM | POA: Diagnosis not present

## 2022-11-26 DIAGNOSIS — Z833 Family history of diabetes mellitus: Secondary | ICD-10-CM

## 2022-11-26 DIAGNOSIS — Z87891 Personal history of nicotine dependence: Secondary | ICD-10-CM

## 2022-11-26 DIAGNOSIS — G9341 Metabolic encephalopathy: Secondary | ICD-10-CM | POA: Diagnosis not present

## 2022-11-26 DIAGNOSIS — Z7901 Long term (current) use of anticoagulants: Secondary | ICD-10-CM

## 2022-11-26 DIAGNOSIS — L89311 Pressure ulcer of right buttock, stage 1: Secondary | ICD-10-CM | POA: Diagnosis not present

## 2022-11-26 DIAGNOSIS — N179 Acute kidney failure, unspecified: Secondary | ICD-10-CM | POA: Diagnosis present

## 2022-11-26 DIAGNOSIS — I2721 Secondary pulmonary arterial hypertension: Secondary | ICD-10-CM | POA: Diagnosis present

## 2022-11-26 DIAGNOSIS — J441 Chronic obstructive pulmonary disease with (acute) exacerbation: Secondary | ICD-10-CM | POA: Diagnosis not present

## 2022-11-26 DIAGNOSIS — I5084 End stage heart failure: Secondary | ICD-10-CM | POA: Diagnosis present

## 2022-11-26 DIAGNOSIS — I4819 Other persistent atrial fibrillation: Secondary | ICD-10-CM

## 2022-11-26 DIAGNOSIS — K5900 Constipation, unspecified: Secondary | ICD-10-CM | POA: Diagnosis not present

## 2022-11-26 DIAGNOSIS — I1310 Hypertensive heart and chronic kidney disease without heart failure, with stage 1 through stage 4 chronic kidney disease, or unspecified chronic kidney disease: Secondary | ICD-10-CM

## 2022-11-26 DIAGNOSIS — L89321 Pressure ulcer of left buttock, stage 1: Secondary | ICD-10-CM | POA: Diagnosis not present

## 2022-11-26 DIAGNOSIS — Z7984 Long term (current) use of oral hypoglycemic drugs: Secondary | ICD-10-CM

## 2022-11-26 DIAGNOSIS — W19XXXD Unspecified fall, subsequent encounter: Secondary | ICD-10-CM | POA: Diagnosis not present

## 2022-11-26 DIAGNOSIS — Z800 Family history of malignant neoplasm of digestive organs: Secondary | ICD-10-CM

## 2022-11-26 DIAGNOSIS — Z888 Allergy status to other drugs, medicaments and biological substances status: Secondary | ICD-10-CM

## 2022-11-26 DIAGNOSIS — Z79899 Other long term (current) drug therapy: Secondary | ICD-10-CM

## 2022-11-26 DIAGNOSIS — M549 Dorsalgia, unspecified: Secondary | ICD-10-CM | POA: Diagnosis not present

## 2022-11-26 DIAGNOSIS — N2581 Secondary hyperparathyroidism of renal origin: Secondary | ICD-10-CM | POA: Diagnosis present

## 2022-11-26 LAB — CBC WITH DIFFERENTIAL/PLATELET
Abs Immature Granulocytes: 0.02 10*3/uL (ref 0.00–0.07)
Basophils Absolute: 0 10*3/uL (ref 0.0–0.1)
Basophils Relative: 1 %
Eosinophils Absolute: 0.4 10*3/uL (ref 0.0–0.5)
Eosinophils Relative: 5 %
HCT: 34 % — ABNORMAL LOW (ref 39.0–52.0)
Hemoglobin: 10.9 g/dL — ABNORMAL LOW (ref 13.0–17.0)
Immature Granulocytes: 0 %
Lymphocytes Relative: 8 %
Lymphs Abs: 0.6 10*3/uL — ABNORMAL LOW (ref 0.7–4.0)
MCH: 30.3 pg (ref 26.0–34.0)
MCHC: 32.1 g/dL (ref 30.0–36.0)
MCV: 94.4 fL (ref 80.0–100.0)
Monocytes Absolute: 0.7 10*3/uL (ref 0.1–1.0)
Monocytes Relative: 10 %
Neutro Abs: 5.6 10*3/uL (ref 1.7–7.7)
Neutrophils Relative %: 76 %
Platelets: 167 10*3/uL (ref 150–400)
RBC: 3.6 MIL/uL — ABNORMAL LOW (ref 4.22–5.81)
RDW: 14.8 % (ref 11.5–15.5)
WBC: 7.4 10*3/uL (ref 4.0–10.5)
nRBC: 0 % (ref 0.0–0.2)

## 2022-11-26 LAB — BASIC METABOLIC PANEL
Anion gap: 10 (ref 5–15)
BUN: 59 mg/dL — ABNORMAL HIGH (ref 8–23)
CO2: 25 mmol/L (ref 22–32)
Calcium: 8.8 mg/dL — ABNORMAL LOW (ref 8.9–10.3)
Chloride: 105 mmol/L (ref 98–111)
Creatinine, Ser: 2.85 mg/dL — ABNORMAL HIGH (ref 0.61–1.24)
GFR, Estimated: 21 mL/min — ABNORMAL LOW (ref >60)
Glucose, Bld: 124 mg/dL — ABNORMAL HIGH (ref 70–99)
Potassium: 3.9 mmol/L (ref 3.5–5.1)
Sodium: 140 mmol/L (ref 135–145)

## 2022-11-26 LAB — SODIUM, URINE, RANDOM: Sodium, Ur: 93 mmol/L

## 2022-11-26 LAB — BRAIN NATRIURETIC PEPTIDE: B Natriuretic Peptide: 238.6 pg/mL — ABNORMAL HIGH (ref 0.0–100.0)

## 2022-11-26 LAB — CBG MONITORING, ED: Glucose-Capillary: 170 mg/dL — ABNORMAL HIGH (ref 70–99)

## 2022-11-26 MED ORDER — INSULIN ASPART 100 UNIT/ML IJ SOLN: 0.0000 [IU] | Freq: Every day | INTRAMUSCULAR | Status: DC

## 2022-11-26 MED ORDER — ISOSORBIDE MONONITRATE ER 30 MG PO TB24
30.0000 mg | ORAL_TABLET | Freq: Three times a day (TID) | ORAL | Status: DC
  Administered 2022-11-27 – 2022-11-29 (×5): 30 mg via ORAL
  Filled 2022-11-26 (×5): qty 1

## 2022-11-26 MED ORDER — SODIUM CHLORIDE 0.9 % IV SOLN: 250.0000 mL | INTRAVENOUS | Status: DC | PRN

## 2022-11-26 MED ORDER — ROSUVASTATIN CALCIUM 10 MG PO TABS
10.0000 mg | ORAL_TABLET | ORAL | Status: DC
  Administered 2022-11-27 – 2022-12-04 (×3): 10 mg via ORAL
  Filled 2022-11-26 (×5): qty 1

## 2022-11-26 MED ORDER — ADULT MULTIVITAMIN W/MINERALS CH
1.0000 | ORAL_TABLET | Freq: Every day | ORAL | Status: DC
  Administered 2022-11-26 – 2022-12-04 (×7): 1 via ORAL
  Filled 2022-11-26 (×7): qty 1

## 2022-11-26 MED ORDER — FUROSEMIDE 10 MG/ML IJ SOLN
60.0000 mg | Freq: Once | INTRAMUSCULAR | Status: AC
  Administered 2022-11-26: 60 mg via INTRAVENOUS
  Filled 2022-11-26: qty 8

## 2022-11-26 MED ORDER — APIXABAN 2.5 MG PO TABS: 2.5000 mg | ORAL_TABLET | Freq: Two times a day (BID) | ORAL | 1 refills | Status: DC

## 2022-11-26 MED ORDER — TRAZODONE HCL 50 MG PO TABS
25.0000 mg | ORAL_TABLET | Freq: Every evening | ORAL | Status: DC | PRN
  Administered 2022-12-04: 25 mg via ORAL
  Filled 2022-11-26: qty 1

## 2022-11-26 MED ORDER — APIXABAN 2.5 MG PO TABS
2.5000 mg | ORAL_TABLET | Freq: Two times a day (BID) | ORAL | Status: AC
  Administered 2022-11-27 – 2022-12-01 (×11): 2.5 mg via ORAL
  Filled 2022-11-26 (×11): qty 1

## 2022-11-26 MED ORDER — OMEGA-3-ACID ETHYL ESTERS 1 G PO CAPS
1.0000 g | ORAL_CAPSULE | Freq: Every day | ORAL | Status: DC
  Administered 2022-11-27 – 2022-12-04 (×6): 1 g via ORAL
  Filled 2022-11-26 (×6): qty 1

## 2022-11-26 MED ORDER — ONDANSETRON HCL 4 MG PO TABS: 4.0000 mg | ORAL_TABLET | Freq: Four times a day (QID) | ORAL | Status: DC | PRN

## 2022-11-26 MED ORDER — ACETAMINOPHEN 325 MG PO TABS
650.0000 mg | ORAL_TABLET | Freq: Four times a day (QID) | ORAL | Status: DC | PRN
  Administered 2022-11-28 – 2022-12-04 (×5): 650 mg via ORAL
  Filled 2022-11-26 (×5): qty 2

## 2022-11-26 MED ORDER — ONDANSETRON HCL 4 MG/2ML IJ SOLN
4.0000 mg | Freq: Four times a day (QID) | INTRAMUSCULAR | Status: DC | PRN
  Administered 2022-12-01: 4 mg via INTRAVENOUS
  Filled 2022-11-26: qty 2

## 2022-11-26 MED ORDER — SODIUM CHLORIDE 0.9% FLUSH
3.0000 mL | Freq: Two times a day (BID) | INTRAVENOUS | Status: DC
  Administered 2022-11-27 – 2023-01-03 (×68): 3 mL via INTRAVENOUS

## 2022-11-26 MED ORDER — DOCUSATE SODIUM 100 MG PO CAPS
100.0000 mg | ORAL_CAPSULE | Freq: Two times a day (BID) | ORAL | Status: DC
  Administered 2022-11-28 – 2023-01-04 (×22): 100 mg via ORAL
  Filled 2022-11-26 (×51): qty 1

## 2022-11-26 MED ORDER — FUROSEMIDE 10 MG/ML IJ SOLN
15.0000 mg/h | INTRAVENOUS | Status: DC
  Administered 2022-11-26 – 2022-11-27 (×2): 6 mg/h via INTRAVENOUS
  Administered 2022-11-28 – 2022-11-29 (×2): 8 mg/h via INTRAVENOUS
  Administered 2022-11-30: 15 mg/h via INTRAVENOUS
  Administered 2022-11-30: 8 mg/h via INTRAVENOUS
  Administered 2022-12-01 – 2022-12-02 (×4): 15 mg/h via INTRAVENOUS
  Filled 2022-11-26 (×11): qty 20

## 2022-11-26 MED ORDER — ACETAMINOPHEN 650 MG RE SUPP: 650.0000 mg | Freq: Four times a day (QID) | RECTAL | Status: DC | PRN

## 2022-11-26 MED ORDER — POLYETHYLENE GLYCOL 3350 17 G PO PACK
17.0000 g | PACK | Freq: Every day | ORAL | Status: DC | PRN
  Filled 2022-11-26: qty 1

## 2022-11-26 MED ORDER — INSULIN ASPART 100 UNIT/ML IJ SOLN
0.0000 [IU] | Freq: Three times a day (TID) | INTRAMUSCULAR | Status: DC
  Administered 2022-11-27: 1 [IU] via SUBCUTANEOUS
  Filled 2022-11-26: qty 1

## 2022-11-26 MED ORDER — HEPARIN SODIUM (PORCINE) 5000 UNIT/ML IJ SOLN: 5000.0000 [IU] | Freq: Three times a day (TID) | INTRAMUSCULAR | Status: DC

## 2022-11-26 MED ORDER — SODIUM CHLORIDE 0.9% FLUSH: 3.0000 mL | INTRAVENOUS | Status: DC | PRN

## 2022-11-26 NOTE — ED Triage Notes (Signed)
Pt sent by kidney doc to have fluid removed. Pt has bilateral leg swelling and abd swelling. Hx of CHF and CKD.

## 2022-11-26 NOTE — ED Provider Notes (Signed)
[facility] Provider Note    Event Date/Time   First MD Initiated Contact with Patient 11/26/22 1600     (approximate)   History   Fluid overload   HPI  Kyle Roberson is a 82 y.o. male who presents to the emergency department today at the advice of his nephrologist for admission for diuresis.  Patient has history of heart failure.  Has had increasing lower extremity and abdominal swelling.  Recently the abdominal swelling has gone to the point where he is starting to feel short of breath.  Patient had a hospitalization last year which required diuresis.     Physical Exam   Triage Vital Signs: ED Triage Vitals  Encounter Vitals Group     BP 11/26/22 1550 (!) 174/68     Systolic BP Percentile --      Diastolic BP Percentile --      Pulse Rate 11/26/22 1550 64     Resp 11/26/22 1550 20     Temp 11/26/22 1550 97.7 F (36.5 C)     Temp Source 11/26/22 1550 Oral     SpO2 11/26/22 1550 96 %     Weight 11/26/22 1551 250 lb (113.4 kg)     Height 11/26/22 1551 5\' 8"  (1.727 m)     Head Circumference --      Peak Flow --      Pain Score 11/26/22 1551 0     Pain Loc --      Pain Education --      Exclude from Growth Chart --     Most recent vital signs: Vitals:   11/26/22 1550  BP: (!) 174/68  Pulse: 64  Resp: 20  Temp: 97.7 F (36.5 C)  SpO2: 96%   General: Awake, alert, oriented. CV:  Good peripheral perfusion. Regular rate and rhythm. Resp:  Normal effort.  Abd:  No distention. Non tender. Other:  Bilateral lower extremity edema.   ED Results / Procedures / Treatments   Labs (all labs ordered are listed, but only abnormal results are displayed) Labs Reviewed  CBC WITH DIFFERENTIAL/PLATELET - Abnormal; Notable for the following components:      Result Value   RBC 3.60 (*)    Hemoglobin 10.9 (*)    HCT 34.0 (*)    Lymphs Abs 0.6 (*)    All other components within normal limits  BASIC METABOLIC PANEL - Abnormal; Notable for the  following components:   Glucose, Bld 124 (*)    BUN 59 (*)    Creatinine, Ser 2.85 (*)    Calcium 8.8 (*)    GFR, Estimated 21 (*)    All other components within normal limits  BRAIN NATRIURETIC PEPTIDE - Abnormal; Notable for the following components:   B Natriuretic Peptide 238.6 (*)    All other components within normal limits     EKG  None   RADIOLOGY None   PROCEDURES:  Critical Care performed: No   MEDICATIONS ORDERED IN ED: Medications  insulin aspart (novoLOG) injection 0-9 Units ( Subcutaneous Not Given 11/26/22 1658)  insulin aspart (novoLOG) injection 0-5 Units ( Subcutaneous Not Given 11/26/22 1657)  furosemide (LASIX) 200 mg in dextrose 5 % 100 mL (2 mg/mL) infusion (has no administration in time range)  apixaban (ELIQUIS) tablet 2.5 mg (has no administration in time range)  isosorbide mononitrate (IMDUR) 24 hr tablet 30 mg (has no administration in time range)  multivitamin with minerals tablet 1 tablet (has no administration in time  range)  omega-3 acid ethyl esters (LOVAZA) capsule 1 g (has no administration in time range)  rosuvastatin (CRESTOR) tablet 10 mg (has no administration in time range)  furosemide (LASIX) injection 60 mg (60 mg Intravenous Given 11/26/22 1638)     IMPRESSION / MDM / ASSESSMENT AND PLAN / ED COURSE  I reviewed the triage vital signs and the nursing notes.                              Differential diagnosis includes, but is not limited to, fluid overload, kidney failure, CHF  Patient's presentation is most consistent with acute presentation with potential threat to life or bodily function.   The patient is on the cardiac monitor to evaluate for evidence of arrhythmia and/or significant heart rate changes.  Patient presented to the emergency department today at the request of his nephrologist for admission for IV diuresis in the setting of fluid overload.  On exam patient is awake and alert.  No significant respiratory distress.   Does have significant lower extremity edema.  Will start IV Lasix.  Discussed with Dr. Allena Katz with the hospitalist service who will plan on admission.      FINAL CLINICAL IMPRESSION(S) / ED DIAGNOSES   Final diagnoses:  Hypervolemia, unspecified hypervolemia type    Note:  This document was prepared using Dragon voice recognition software and may include unintentional dictation errors.    Phineas Semen, MD 11/26/22 [phone]

## 2022-11-26 NOTE — H&P (Addendum)
History and Physical    Patient: Kyle Roberson CHE:[id number] DOB: [birth year] DOA: 11/26/2022 DOS: the patient was seen and examined on 11/26/2022 PCP: [name], MD  Patient coming from: Home  Chief Complaint: shortness of breath and weight gain with volume overload  HPI: Kyle Roberson is a 82 y.o. 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 Garnett Farm office with increasing weight gain, bilateral lower extremity swelling and shortness of breath.  Patient's baseline weight is 220 pounds. Weight and nephrology office was 255 pounds. Patient is being admitted with acute on chronic diastolic heart failure. He received Lasix 60 mg times one in the ED. Patient denies any chest pain fever cough  Wife is at bedside.  Review of Systems: As mentioned in the history of present illness. All other systems reviewed and are negative. Past Medical History:  Diagnosis Date   AKI (acute kidney injury) (HCC) 07/06/2018   CAD (coronary artery disease)    a. Remote PCI/stenting to LAD w PTCA Diagnoal. RCA 60%; b.  2005/2008 Cardiolites w/ reportedly mild ischemia in Diag territory-->Med rx.   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 04/2019 Echo: EF 55-60%, no rwma, nl RV fxn, RVSP 55.23mmHg. Mod dil LA. Mild-mod MR. Mod dil PA.   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 04/2019 Echo: RVSP 55.57mmHg.   Permanent atrial fibrillation (HCC)    a. CHA2DS2VASc = 5-->dose adjusted eliquis  (age/creat).   Subdural hematoma (HCC)    a. 01/2021 in setting of fall s/p L frontotemporal craniotomy.   Past Surgical History:  Procedure Laterality Date   BLEPHAROPLASTY     CARDIAC CATHETERIZATION     CORONARY STENT INTERVENTION     CRANIOTOMY Left 02/04/2021   Procedure: CRANIOTOMY FOR LEFT SUBDURAL  HEMATOMA EVACUATION;  Surgeon: Lucy Chris, MD;  Location: [facility] ORS;  Service: Neurosurgery;  Laterality: Left;   CYSTOSCOPY     HERNIA REPAIR     Social History:  reports that he has quit smoking. He has never used smokeless tobacco. He reports that he does not currently use alcohol after a past usage of about 2.0 standard drinks of alcohol per week. He reports that he does not currently use drugs.  Allergies  Allergen Reactions   Atorvastatin Hives, Itching and Other (See Comments)    Other reaction(s): Other (See Comments)   Simvastatin Hives, Itching and Other (See Comments)    Other reaction(s): Other (See Comments)    Family History  Problem Relation Age of Onset   Pancreatic cancer Mother    CAD Father    Diabetes Brother     Prior to Admission medications   Medication Sig Start Date End Date Taking? Authorizing Provider  apixaban (ELIQUIS) 2.5 MG TABS tablet Take 1 tablet (2.5 mg total) by mouth 2 (two) times daily. 11/26/22   Bensimhon, Bevelyn Buckles, MD  Ascorbic Acid (VITAMIN C PO) Take 1 tablet by mouth in the morning.    [provider]  Cholecalciferol (VITAMIN D3) 1.25 MG (50000  UT) CAPS Take 1 tablet by mouth in the morning.    [provider]  Coenzyme Q10-Vitamin E (QUNOL ULTRA COQ10 PO) Take 1 capsule by mouth in the morning.    [provider]  dorzolamide-timolol (COSOPT) 22.3-6.8 MG/ML ophthalmic solution Place 1 drop into the left eye 2 (two) times daily. 08/28/20   [provider]  doxazosin (CARDURA) 1 MG tablet TAKE 1 TABLET(1 MG) BY MOUTH TWICE DAILY 10/05/22   Gollan, Tollie Pizza, MD  Flaxseed, Linseed, (FLAXSEED OIL PO)  Take 1 capsule by mouth in the morning.    [provider]  Furosemide (FUROSCIX) 80 MG/10ML CTKT Inject 80 mg into the skin as directed. 07/03/22   Bensimhon, Bevelyn Buckles, MD  furosemide (LASIX) 40 MG tablet Take 1 tablet daily every other day, alternating with 2 tabs daily every other day 09/21/22   Bensimhon, Bevelyn Buckles, MD  glipiZIDE (GLUCOTROL XL) 5 MG 24 hr tablet Take 1 tablet (5 mg total) by mouth daily with breakfast. 07/27/22   [name], MD  hydrALAZINE (APRESOLINE) 100 MG tablet TAKE 1 TABLET BY MOUTH THREE TIMES DAILY, WITH EXTRA 100 MG AS NEEDED FOR HIGH PRESSURE 10/05/22   Antonieta Iba, MD  isosorbide mononitrate (IMDUR) 30 MG 24 hr tablet Take 1 tablet (30 mg total) by mouth in the morning, at noon, and at bedtime. 07/10/22   Bensimhon, Bevelyn Buckles, MD  ketorolac (ACULAR) 0.5 % ophthalmic solution Place 1 drop into the left eye 4 (four) times daily. 03/16/22   Cuthriell, Delorise Royals, PA-C  Multiple Vitamin (MULTIVITAMIN WITH MINERALS) TABS tablet Take 1 tablet by mouth in the morning.    [provider]  nitroGLYCERIN (NITROLINGUAL) 0.4 MG/SPRAY spray Place 1 spray under the tongue as directed. 06/22/22 07/11/23  Bensimhon, Bevelyn Buckles, MD  Omega-3 Fatty Acids (FISH OIL PO) Take 1 capsule by mouth in the morning.    [provider]  rosuvastatin (CRESTOR) 10 MG tablet Take 1 tablet (10 mg total) by mouth 3 (three) times a week. 03/05/22   Bensimhon, Bevelyn Buckles, MD  Turmeric (QC TUMERIC COMPLEX PO) Take 1 capsule by mouth in the morning. Qunol Turmeric Curcumin- 40 MG    [provider]    Physical Exam: Vitals:   11/26/22 1550 11/26/22 1551  BP: (!) 174/68   Pulse: 64   Resp: 20   Temp: 97.7 F (36.5 C)   TempSrc: Oral   SpO2: 96%   Weight:  113.4 kg  Height:  5\' 8"  (1.727 m)   Physical Exam Constitutional:      Appearance: Normal appearance. He is obese.  Eyes:     Pupils: Pupils are equal, round, and reactive to light.  Cardiovascular:      Rate and Rhythm: Normal rate and regular rhythm.     Pulses: Normal pulses.  Pulmonary:     Effort: Pulmonary effort is normal.     Comments: Decreased BS bases Abdominal:     General: Abdomen is flat. Bowel sounds are normal.     Palpations: Abdomen is soft.     Comments: Morbid Obesity  Musculoskeletal:        General: Normal range of motion.     Cervical back: Normal range of motion and neck supple.  Skin:    Comments: Chronic bilateral lymphedema with chronic skin scaling and thickening  Neurological:     General: No focal deficit present.     Mental Status: He is alert and oriented to person,  place, and time.  Psychiatric:        Mood and Affect: Mood normal.        Behavior: Behavior normal.    Assessment and Plan:  Acute on chronic diastolic congestive heart failure -- patient came in with volume overload with increasing weight up to 255 pounds. Dry weight around ~220 pounds -- last echo was done in December 2023 which showed elevated right ventricular pressure and severe pulmonary hypertension -- will repeat echo -- cardiology consultation with Dr. Mariah Milling -- patient received Lasix 60 mg IV once will start patient on Lasix drip 6 mg per hour per Dr. Doristine Church recommendation -- monitor input output and creatinine   chronic kidney disease stage IV -- baseline creatinine around 2.5-- 2.7 -- creatinine 2.85 -- nephrology consultation with Dr. 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 Inpatient status is judged to be reasonable and necessary in order to provide the required intensity of service to ensure the patient's safety. The patient's presenting symptoms, physical exam findings, and initial radiographic and laboratory data in the context of their chronic comorbidities is felt to place them at high risk for further clinical deterioration. Furthermore, it is not anticipated that the patient will be medically stable for discharge from the hospital within 2 midnights of admission.   * I certify that at the point of admission it is my clinical judgment that the patient will require inpatient hospital care spanning beyond 2 midnights from the point of admission due to high intensity of service, high risk for further deterioration and high frequency of surveillance required.*  Author: Enedina Finner, MD 11/26/2022 5:07 PM  For on call review [email or web address].

## 2022-11-27 ENCOUNTER — Other Ambulatory Visit (HOSPITAL_COMMUNITY): Payer: Self-pay

## 2022-11-27 ENCOUNTER — Inpatient Hospital Stay (HOSPITAL_COMMUNITY)
Admit: 2022-11-27 | Discharge: 2022-11-27 | Disposition: A | Discharge status: Home / Self Care | Payer: Medicare HMO | Attending: Internal Medicine | Admitting: Internal Medicine

## 2022-11-27 DIAGNOSIS — I5033 Acute on chronic diastolic (congestive) heart failure: Secondary | ICD-10-CM | POA: Diagnosis not present

## 2022-11-27 LAB — ECHOCARDIOGRAM COMPLETE
AR max vel: 1.98 cm2
AV Area VTI: 2.14 cm2
AV Area mean vel: 2.04 cm2
AV Mean grad: 7 mmHg
AV Peak grad: 13 mmHg
Ao pk vel: 1.8 m/s
Area-P 1/2: 3.74 cm2
Height: 68 in
MV VTI: 2.37 cm2
S' Lateral: 3.2 cm
Weight: 4000 [oz_av]

## 2022-11-27 LAB — CBC
HCT: 35 % — ABNORMAL LOW (ref 39.0–52.0)
Hemoglobin: 11 g/dL — ABNORMAL LOW (ref 13.0–17.0)
MCH: 30.1 pg (ref 26.0–34.0)
MCHC: 31.4 g/dL (ref 30.0–36.0)
MCV: 95.9 fL (ref 80.0–100.0)
Platelets: 173 10*3/uL (ref 150–400)
RBC: 3.65 MIL/uL — ABNORMAL LOW (ref 4.22–5.81)
RDW: 14.8 % (ref 11.5–15.5)
WBC: 7.2 10*3/uL (ref 4.0–10.5)
nRBC: 0 % (ref 0.0–0.2)

## 2022-11-27 LAB — CBG MONITORING, ED
Glucose-Capillary: 135 mg/dL — ABNORMAL HIGH (ref 70–99)
Glucose-Capillary: 180 mg/dL — ABNORMAL HIGH (ref 70–99)

## 2022-11-27 LAB — CREATININE, SERUM
Creatinine, Ser: 2.94 mg/dL — ABNORMAL HIGH (ref 0.61–1.24)
GFR, Estimated: 20 mL/min — ABNORMAL LOW (ref >60)

## 2022-11-27 LAB — GLUCOSE, CAPILLARY
Glucose-Capillary: 147 mg/dL — ABNORMAL HIGH (ref 70–99)
Glucose-Capillary: 188 mg/dL — ABNORMAL HIGH (ref 70–99)

## 2022-11-27 LAB — SODIUM, URINE, RANDOM: Sodium, Ur: 71 mmol/L

## 2022-11-27 MED ORDER — DOXAZOSIN MESYLATE 1 MG PO TABS
1.0000 mg | ORAL_TABLET | Freq: Two times a day (BID) | ORAL | Status: DC
  Administered 2022-11-27 – 2023-01-04 (×66): 1 mg via ORAL
  Filled 2022-11-27 (×79): qty 1

## 2022-11-27 MED ORDER — DAPAGLIFLOZIN PROPANEDIOL 10 MG PO TABS
10.0000 mg | ORAL_TABLET | Freq: Every day | ORAL | Status: DC
  Administered 2022-11-27 – 2022-11-30 (×4): 10 mg via ORAL
  Filled 2022-11-27 (×4): qty 1

## 2022-11-27 MED ORDER — HYDRALAZINE HCL 50 MG PO TABS
100.0000 mg | ORAL_TABLET | Freq: Three times a day (TID) | ORAL | Status: DC
  Administered 2022-11-27 – 2022-12-01 (×14): 100 mg via ORAL
  Filled 2022-11-27 (×14): qty 2

## 2022-11-27 MED ORDER — VITAMIN D 25 MCG (1000 UNIT) PO TABS
2000.0000 [IU] | ORAL_TABLET | Freq: Every day | ORAL | Status: DC
  Administered 2022-11-28 – 2023-01-04 (×32): 2000 [IU] via ORAL
  Filled 2022-11-27 (×34): qty 2

## 2022-11-27 MED ORDER — COENZYME Q10-VITAMIN E 100-150 MG-UNIT PO CAPS: ORAL_CAPSULE | Freq: Every morning | ORAL | Status: DC

## 2022-11-27 MED ORDER — NEOMYCIN-POLYMYXIN-DEXAMETH 3.5-10000-0.1 OP OINT
1.0000 | TOPICAL_OINTMENT | Freq: Three times a day (TID) | OPHTHALMIC | Status: DC
  Administered 2022-11-27 – 2023-01-04 (×94): 1 via OPHTHALMIC
  Filled 2022-11-27 (×6): qty 3.5

## 2022-11-27 NOTE — ED Notes (Signed)
This RN to patient's bedside for med. administration. Patient was currently undergoing an echocardiogram. Patient had taken his nasal cannula off for comfort but his O2 was 87%. This RN assisted patient to place nasal cannula back on and reminded of the importance of wearing the supplemental oxygen to keep O2 in the 90s. This RN assisted patient to reposition in bed for comfort. This RN gave patient call light.

## 2022-11-27 NOTE — Progress Notes (Signed)
[facility] [place], [place] 11/27/22  Subjective:   Hospital day # 1  Patient known to our practice from outpatient follow-up.  He was sent over to the emergency room for admission due to severe volume overload.  Previous weight in the office in April 2024 was 237 pounds. At his last office visit on 11/25/2022, weight had increased to 255 pounds. Patient was referred to the emergency room for IV diuresis. Yesterday was placed on IV furosemide infusion at 6 mg/h with brisk diuresis and good response. Serum creatinine trends are noted below.   Renal: 09/05 0701 - 09/06 0700 In: -  Out: 850 [Urine:850] Lab Results  Component Value Date   CREATININE 2.94 (H) 11/27/2022   CREATININE 2.85 (H) 11/26/2022   CREATININE 3.04 (H) 08/24/2022     Objective:  Vital signs in last 24 hours:  Temp:  [97.4 F (36.3 C)-98 F (36.7 C)] 97.8 F (36.6 C) (09/06 1451) Pulse Rate:  [44-78] 62 (09/06 1451) Resp:  [16-33] 17 (09/06 1451) BP: (131-176)/(53-87) 147/62 (09/06 1451) SpO2:  [91 %-100 %] 99 % (09/06 1451) Weight:  [113.4 kg] 113.4 kg (09/05 1551)  Weight change:  Filed Weights   11/26/22 1551  Weight: 113.4 kg    Intake/Output:    Intake/Output Summary (Last 24 hours) at 11/27/2022 1456 Last data filed at 11/27/2022 0945 Gross per 24 hour  Intake --  Output 1800 ml  Net -1800 ml     Physical Exam: General: Elderly gentleman, laying in the bed  HEENT Moist oral mucous membranes, lower eyelid conjunctival erythema.  Pulm/lungs Normal breathing effort, decreased breath sounds at bases  CVS/Heart Irregular, bradycardic  Abdomen:  Soft, nontender  Extremities: Significant edema to lower abdomen bilaterally  Neurologic: Alert, oriented  Skin: Skin changes from chronic edema.          Basic Metabolic Panel:  Recent Labs  Lab 11/26/22 1555 11/27/22 0245  NA 140  --   K 3.9  --   CL 105  --   CO2 25  --   GLUCOSE 124*  --   BUN 59*  --    CREATININE 2.85* 2.94*  CALCIUM 8.8*  --      CBC: Recent Labs  Lab 11/26/22 1555 11/27/22 0245  WBC 7.4 7.2  NEUTROABS 5.6  --   HGB 10.9* 11.0*  HCT 34.0* 35.0*  MCV 94.4 95.9  PLT 167 173     No results found for: "HEPBSAG", "HEPBSAB", "HEPBIGM"    Microbiology:  No results found for this or any previous visit (from the past 240 hour(s)).  Coagulation Studies: No results for input(s): "LABPROT", "INR" in the last 72 hours.  Urinalysis: No results for input(s): "COLORURINE", "LABSPEC", "PHURINE", "GLUCOSEU", "HGBUR", "BILIRUBINUR", "KETONESUR", "PROTEINUR", "UROBILINOGEN", "NITRITE", "LEUKOCYTESUR" in the last 72 hours.  Invalid input(s): "APPERANCEUR"    Imaging: No results found.   Medications:    sodium chloride     furosemide (LASIX) 200 mg in dextrose 5 % 100 mL (2 mg/mL) infusion 6 mg/hr (11/27/22 1108)    apixaban  2.5 mg Oral BID   [START ON 11/28/2022] cholecalciferol  2,000 Units Oral Daily   dapagliflozin propanediol  10 mg Oral Daily   docusate sodium  100 mg Oral BID   doxazosin  1 mg Oral BID   hydrALAZINE  100 mg Oral TID   insulin aspart  0-5 Units Subcutaneous QHS   insulin aspart  0-9 Units Subcutaneous TID WC   isosorbide mononitrate  30 mg Oral TID AC   multivitamin with minerals  1 tablet Oral Daily   neomycin-polymyxin b-dexamethasone  1 Application Right Eye TID   omega-3 acid ethyl esters  1 g Oral Daily   rosuvastatin  10 mg Oral Once per day on Monday Wednesday Friday   sodium chloride flush  3 mL Intravenous Q12H   sodium chloride, acetaminophen **OR** acetaminophen, ondansetron **OR** ondansetron (ZOFRAN) IV, polyethylene glycol, sodium chloride flush, traZODone  Assessment/ Plan:  82 y.o. male with type 2 diabetes, coronary disease, chronic kidney disease, chronic diastolic CHF, bilateral lower extremity lymphedema, permanent atrial fibrillation, pulmonary hypertension, history of subdural hematoma  admitted on 11/26/2022  for Acute on chronic diastolic CHF (congestive heart failure) (HCC) [I50.33]  Volume overload, lower extremity edema in the setting of diastolic CHF exacerbation. -Continue IV furosemide infusion and monitor urine output closely. -Agree with starting SGLT2 inhibitor-dapagliflozin for CHF, diabetic nephropathy indication. -Avoid hypotension. -No acute indication for dialysis at present. -Target weight-220 pounds back in December however he was 237 pounds in April 2024.  Chronic kidney disease stage IV secondary to type 2 diabetes Baseline creatinine is ranging between 2.8-3.0. Continue dapagliflozin, rosuvastatin Anticoagulation with apixaban. Discontinue glipizide as patient was having hypoglycemic episodes at home.    LOS: 1 [name] Thedore Mins 9/6/20242:56 PM  [facility] [place], [place] [phone]  Note: This note was prepared with Dragon dictation. Any transcription errors are unintentional

## 2022-11-27 NOTE — TOC Initial Note (Signed)
Transition of Care [facility]) - Initial/Assessment Note    Patient Details  Name: Kyle Roberson MRN: [id number] Date of Birth: 17-Jun-1940  Transition of Care [facility]) CM/SW Contact:    Marquita Palms, LCSW Phone Number: 11/27/2022, 3:34 PM  Clinical Narrative:                  CSW met with patient and his wife bedside. Pt's wife reports that he has all he needs at home. RW, Quad cane, WC, Shower chair etc.   Pt will be going home with wife with no HH or PT/OT. Pt and wife stated they do not need it. No other information given.  Expected Discharge Plan: Home/Self Care Barriers to Discharge: No Barriers Identified   Patient Goals and CMS Choice            Expected Discharge Plan and Services       Living arrangements for the past 2 months: Single Family Home                                      Prior Living Arrangements/Services Living arrangements for the past 2 months: Single Family Home Lives with:: Spouse                   Activities of Daily Living   ADL Screening (condition at time of admission) Patient's cognitive ability adequate to safely complete daily activities?: Yes Is the patient deaf or have difficulty hearing?: No Does the patient have difficulty seeing, even when wearing glasses/contacts?: No Does the patient have difficulty concentrating, remembering, or making decisions?: No Patient able to express need for assistance with ADLs?: Yes Does the patient have difficulty dressing or bathing?: No  Permission Sought/Granted                  Emotional Assessment Appearance:: Disheveled Attitude/Demeanor/Rapport: Charismatic   Orientation: : Oriented to Self, Oriented to Place, Oriented to Situation      Admission diagnosis:  Acute on chronic diastolic CHF (congestive heart failure) (HCC) [I50.33] Patient Active Problem List   Diagnosis Date Noted   Acute on chronic diastolic CHF (congestive heart failure) (HCC) 11/26/2022    Hypervolemia 11/26/2022   Pulmonary hypertension, unspecified (HCC) 02/19/2022   Acute renal failure with acute tubular necrosis superimposed on stage 4 chronic kidney disease (HCC) 02/19/2022   Permanent atrial fibrillation (HCC) 02/11/2022   Paroxysmal atrial fibrillation (HCC) 02/09/2022   Dyslipidemia 02/09/2022   Depression 02/09/2022   Elevated troponin 02/09/2022   Thyroid nodule 06/04/2021   Diabetic peripheral neuropathy (HCC)    History of subdural hemorrhage 02/04/2021   Hyperlipidemia associated with type 2 diabetes mellitus (HCC)    Acute on chronic right-sided heart failure (HCC) 05/07/2019   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 04/28/2019   CKD (chronic kidney disease) stage 4, GFR 15-29 ml/min (HCC) 04/27/2019   Acute on chronic heart failure with preserved ejection fraction (HFpEF) (HCC) 04/27/2019   Obesity, Class III, BMI 40-49.9 (morbid obesity) (HCC) 04/27/2019   Lymphedema 03/23/2019   Erectile dysfunction 08/01/2018   History of colonic diverticulitis 08/01/2018   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   Venous insufficiency 08/01/2018   Persistent atrial fibrillation (HCC) 08/01/2018   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   Essential hypertension 07/06/2018   Type 2 diabetes mellitus with stage 4 chronic kidney disease, without long-term current use of insulin (HCC) 07/06/2018  Coronary artery disease involving native coronary artery of native heart without angina pectoris 06/05/2018   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 [place] [address] Phone: [phone] Fax: [phone]  [facility] - [place], [place] - [address] [place] [address] Phone: [phone] Fax: [phone]  [facility] [address] [place] [address] Phone: [phone] Fax: [phone]     Social Determinants of Health (SDOH) Social History: SDOH Screenings   Food Insecurity: No Food Insecurity (02/11/2022)  Housing: Low Risk  (02/11/2022)  Transportation Needs: No Transportation Needs (02/11/2022)  Utilities: Not At Risk (02/11/2022)  Depression (PHQ2-9): Low Risk  (07/27/2022)  Tobacco Use: Medium Risk (11/26/2022)   SDOH Interventions:     Readmission Risk Interventions     No data to display

## 2022-11-27 NOTE — ED Notes (Signed)
Patient placed in [facility] hospital bed for comfort. Old linen removed from patient bed and patient repositioned. This RN and Elana RN talked with patient and visitor to build rapport. Patient given call light and informed to press if he has any needs.

## 2022-11-27 NOTE — Progress Notes (Signed)
OT Cancellation Note  Patient Details Name: [name] MRN: [id number] DOB: [birth year]   Cancelled Treatment:    Reason Eval/Treat Not Completed: Patient at procedure or test/ unavailable. Upon attempt, pt out of the room. Per chart, transferring to the floor. Will re-attempt OT evaluation next date.   Arman Filter., MPH, MS, OTR/L ascom [phone] 11/27/22, 3:53 PM

## 2022-11-27 NOTE — Consult Note (Addendum)
Advanced Heart Failure Team Consult Note   Primary Physician: [name], MD PCP-Cardiologist:  Julien Nordmann, MD  Reason for Consultation: Acute on chronic diastolic heart failure with associated cardiorenal syndrome  HPI:    Kyle Roberson is a 82 y.o. 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 Allena Katz.   Patient presented after being seen by his outpatient nephrologist and gained about 35 pounds. After discussion with his wife, Kyle Roberson, at bedside, she notes that over the last 6 weeks he has slowly gained weight and no longer responds to the furosicx.  She reports that previously he would have a 1 to 2 pound weight loss with the furosicx, but has been using it once or twice a week recently without much improvement in diuresis.  Her husband does not particularly enjoy being admitted to the hospital so they had held off likely longer than they should have.  They have been very adherent to a low-sodium diet and have been watching his fluid intake rigorously.  He has responded well thus far to IV diuresis and has made between 3 to 4 L of urine.     Objective:    Vital Signs:   Temp:  [97.4 F (36.3 C)-98 F (36.7 C)] 97.4 F (36.3 C) (09/06 1245) Pulse Rate:  [44-78] 62 (09/06 1245) Resp:  [16-33] 28 (09/06 1245) BP: (131-176)/(53-87) 158/81 (09/06 1245) SpO2:  [91 %-100 %] 92 % (09/06 1245) Weight:  [113.4 kg] 113.4 kg (09/05 1551)    Weight change: Filed Weights   11/26/22 1551  Weight: 113.4 kg    Intake/Output:   Intake/Output Summary (Last 24 hours) at 11/27/2022 1446 Last data filed at 11/27/2022 0945 Gross per 24 hour  Intake --  Output 1800 ml  Net -1800 ml      Physical Exam    Vitals:   11/27/22 1245 11/27/22 1451  BP: (!)  158/81 (!) 147/62  Pulse: 62 62  Resp: (!) 28 17  Temp: (!) 97.4 F (36.3 C) 97.8 F (36.6 C)  SpO2: 92% 99%   GENERAL: Chronically ill-appearing, lying in bed, belly breathing HEENT: Negative for xanthelasma. There is no scleral icterus.  The mucous membranes are pink and moist.   CHEST: There are no chest wall deformities. There is no chest wall tenderness. Respirations are unlabored.  Lungs-diffuse rales bilaterally CARDIAC:  JVP: elevated to the earlobe lying in bed          Bradycardic with regular rhythm ABDOMEN: Soft, non-tender, non-distended. EXTREMITIES: Extreme, woody, 3+ edema to the lower extremities.  Likely both a component of cardiac and lymphedema NEUROLOGIC: Patient is oriented x3 with no focal or lateralizing neurologic deficits.  PSYCH: Patients affect is appropriate, there is no evidence of anxiety or depression.   Labs   Basic Metabolic Panel: Recent Labs  Lab 11/26/22 1555 11/27/22 0245  NA 140  --   K 3.9  --   CL 105  --   CO2 25  --   GLUCOSE 124*  --   BUN 59*  --   CREATININE 2.85* 2.94*  CALCIUM 8.8*  --       Assessment/Plan   Kyle Roberson is a 82 y.o. 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 Allena Katz.   Acute on chronic diastolic heart failure with cardiorenal syndrome: Progressive weight gain and volume overload with loss of response to diuretics at home.  Adherent to medications as well as diet, trigger likely worsening CKD as well as abdominal edema in the setting of HFpEF.  Excellent response to initial IV Lasix bolus as evidenced by adequate natruesis and urine sodium of 93.  Would aim for goal around 2 L net negative daily, may need increase in Lasix drip over the next few days.  Will add augmentation with  SGLT2 after discussion with nephrology.  Wife reports that patient is not incontinent. -Continue Lasix drip at 6 mg/hr -Goal net -2 L, increase as needed -Farxiga 10 mg started -Continue home blood pressure medication, wife reports that the 30 mg Imdur 3 times daily dosing has worked well for him in the past -Poor long-term prognosis given advanced CKD but his initial response to diuretics is encouraging -Anticipate lengthy stay given degree of volume overload  Pulmonary hypertension: Echo 11/23: EF 55-60%, interventricular septum flattened in systole consistent with RV pressure overload, RV function okay, RVSP severely elevated 83 mmHg(previously 56 mmhg in 2021), mild MR, moderate TR, dilated pulmonary artery, dilated IVC. Likely a component of Group II/III. - Diuresis as above  CAD: - Hx remote stenting to LAD and diagonal - No s/s angina - Not candidate for cath with CKD IV -Continue statin -No aspirin since anticoagulated   Permanent atrial fibrillation: - Rate 50s without rate control meds - On Eliquis 2.5 mg BID (appropriate dose for age and renal function) - No bleeding  Length of Stay: 1  Romie Minus, MD  11/27/2022, 2:46 PM  Advanced Heart Failure Team Pager [phone] (M-F; 7a - 5p)  Please contact [facility] for night-coverage after hours (4p -7a ) and weekends on [email or web address]

## 2022-11-27 NOTE — Progress Notes (Signed)
[facility]  - [place] [facility]   PATIENT NAME: Kyle Roberson    MR#:  [id number]  DATE OF BIRTH:  1940-04-27  SUBJECTIVE:   Wife at bedside. Wife reports about 3 1/2 canisters of urine was dumped by staff since start of IV Lasix drip. Patient trying to work with PT when I went to evaluate. Gets out of breath and tired with PT. No chest pain.   VITALS:  Blood pressure (!) 149/58, pulse 60, temperature (!) 97.4 F (36.3 C), temperature source Oral, resp. rate (!) 28, height 5\' 8"  (1.727 m), weight 113.4 kg, SpO2 97%.  PHYSICAL EXAMINATION:   GENERAL:  82 y.o.-year-old patient with no acute distress. Obese LUNGS: Normal breath sounds bilaterally, no wheezing CARDIOVASCULAR: S1, S2 normal. No murmur   ABDOMEN: Soft, nontender, abdominal obesity EXTREMITIES: bilateral chronic lymphedema with significant swelling NEUROLOGIC: nonfocal  patient is alert and awake      LABORATORY PANEL:  CBC Recent Labs  Lab 11/27/22 0245  WBC 7.2  HGB 11.0*  HCT 35.0*  PLT 173    Chemistries  Recent Labs  Lab 11/26/22 1555 11/27/22 0245  NA 140  --   K 3.9  --   CL 105  --   CO2 25  --   GLUCOSE 124*  --   BUN 59*  --   CREATININE 2.85* 2.94*  CALCIUM 8.8*  --    Assessment and Plan   Lonnel Jared is a 82 y.o. 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 Garnett Farm office with increasing weight gain, bilateral lower extremity swelling and shortness of breath.  Patient's baseline weight is 220 pounds. Weight and nephrology office was 255 pounds. Patient is being admitted with acute on chronic diastolic heart failure.   Acute on chronic diastolic congestive heart failure -- patient came in with volume overload with increasing weight up  to 255 pounds. Dry weight around ~220 pounds -- last echo was done in December 2023 which showed elevated right ventricular pressure and severe pulmonary hypertension -- will repeat echo -- cardiology consultation with [facility] heart failure Dr Larina Bras -- patient received Lasix 60 mg IV once will start patient on Lasix drip 6 mg/hr Dr. Doristine Church recommendation -- monitor input output and creatinine -- diuresing well --pt does NOT wear oxygen at home per wife    chronic kidney disease stage IV -- baseline creatinine around 2.5-- 2.7 -- creatinine 2.85 -- nephrology consultation with Dr. 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 -- sliding scale insulin   Morbid obesity -- complicates overall prognosis   Hypertension -- reusmed home emds   Hyperlipidemia -- statins   CAD with remote PCI -- continue statins   History of paroxysmal a fib -- rate well-controlled -- patient on eliquis    Advance Care Planning:   Code Status: Full Code discussed with patient and wife in the ER  Consults: cardiology, nephrology  Family Communication: wife at bedside in the ER  DVT Prophylaxis :eliquis Level of care: Telemetry Cardiac Status is: Inpatient Remains inpatient appropriate because: CHF on IV lasix gtt    TOTAL TIME TAKING CARE OF THIS PATIENT: 35 minutes.  >50% time spent on counselling and coordination of care  Note: This dictation was  prepared with Dragon dictation along with smaller phrase technology. Any transcriptional errors that result from this process are unintentional.  Enedina Finner M.D    [facility]   CC: Primary care physician; [name], MD

## 2022-11-27 NOTE — Progress Notes (Signed)
*  PRELIMINARY RESULTS* Echocardiogram 2D Echocardiogram has been performed.  Carolyne Fiscal 11/27/2022, 3:05 PM

## 2022-11-27 NOTE — TOC Benefit Eligibility Note (Signed)
Pharmacy Patient Advocate Encounter  Insurance verification completed.    The patient is insured through  [employer] test claim for [place]. Currently a quantity of 30 is a 30 day supply and the co-pay is $60.00 .  Ran test claim for [employer]. Currently a quantity of 30 is a 30 day supply and the co-pay is $60.00 .   This test claim was processed through [facility]- copay amounts may vary at other pharmacies due to pharmacy/plan contracts, or as the patient moves through the different stages of their insurance plan.

## 2022-11-27 NOTE — Evaluation (Signed)
Physical Therapy Evaluation Patient Details Name: Kyle Roberson MRN: [id number] DOB: [birth year] Today's Date: 11/27/2022  History of Present Illness  Kyle Roberson is an 81yoM who comes to [facility] from OP nephrology on 9/5 due to LEE and ABD swelling. PMH: CKD, CHF.  Clinical Impression  Pt in ED still, wife at bedside. Pt reports to still feel poorly. Pt still requires supplemental O2. Visible edema in feet and ABD. Pt reuires minA to get to EOB, max effort for repositioning of hips results in minimal progress. Pt given MinA to come to standing with RW. He denies any frank acute weakness, but he typically transfers better than this per his report. Pt has a WC at home should he be unable to resume typical AMB function by time of DC. Will continue to follow. Anticipate improved transfers and AMB as his diuresis continues.       If plan is discharge home, recommend the following: A lot of help with walking and/or transfers;A lot of help with bathing/dressing/bathroom;Assist for transportation;Help with stairs or ramp for entrance;Assistance with cooking/housework   Can travel by private vehicle        Equipment Recommendations None recommended by PT  Recommendations for Other Services       Functional Status Assessment Patient has had a recent decline in their functional status and demonstrates the ability to make significant improvements in function in a reasonable and predictable amount of time.     Precautions / Restrictions Precautions Precautions: Fall Restrictions Weight Bearing Restrictions: No      Mobility  Bed Mobility Overal bed mobility: Needs Assistance Bed Mobility: Supine to Sit     Supine to sit: HOB elevated, Min assist     General bed mobility comments: really struggles with scooting hips forward at EOB    Transfers Overall transfer level: Needs assistance Equipment used: Rolling walker (2 wheels) Transfers: Sit to/from Stand Sit to Stand: Min assist            General transfer comment: requires elevated EOB and manual stabilization of RW with a superior to inferior force vector    Ambulation/Gait                  Stairs            Wheelchair Mobility     Tilt Bed    Modified Rankin (Stroke Patients Only)       Balance                                             Pertinent Vitals/Pain Pain Assessment Pain Assessment: No/denies pain    Home Living Family/patient expects to be discharged to:: Private residence Living Arrangements: Spouse/significant other Available Help at Discharge: Family;Available PRN/intermittently Type of Home: House Home Access: Ramped entrance       Home Layout: One level Home Equipment: Cane - quad;Shower seat;Grab bars - toilet;Grab bars - tub/shower;Hand held shower head;Wheelchair Financial trader (4 wheels) Additional Comments: lift chair    Prior Function Prior Level of Function : Needs assist             Mobility Comments: per wife AMB withquad cane or nothing for household mobility; at baseline does not go out of house for mobility unless needed ADLs Comments: Wife assists with ADL (min-modA);     Extremity/Trunk Assessment  Communication      Cognition Arousal: Alert Behavior During Therapy: WFL for tasks assessed/performed Overall Cognitive Status: Within Functional Limits for tasks assessed                                          General Comments      Exercises     Assessment/Plan    PT Assessment Patient needs continued PT services  PT Problem List Decreased strength;Decreased range of motion;Decreased activity tolerance;Decreased balance;Decreased mobility       PT Treatment Interventions DME instruction;Therapeutic activities;Therapeutic exercise    PT Goals (Current goals can be found in the Care Plan section)  Acute Rehab PT Goals Patient Stated Goal: remain supervision level  for moblity, avoid falling PT Goal Formulation: With patient Time For Goal Achievement: 12/11/22 Potential to Achieve Goals: Fair    Frequency Min 1X/week     Co-evaluation               AM-PAC PT "6 Clicks" Mobility  Outcome Measure Help needed turning from your back to your side while in a flat bed without using bedrails?: A Lot Help needed moving from lying on your back to sitting on the side of a flat bed without using bedrails?: A Lot Help needed moving to and from a bed to a chair (including a wheelchair)?: A Lot Help needed standing up from a chair using your arms (e.g., wheelchair or bedside chair)?: A Lot Help needed to walk in hospital room?: Total Help needed climbing 3-5 steps with a railing? : Total 6 Click Score: 10    End of Session Equipment Utilized During Treatment: Oxygen Activity Tolerance: Patient tolerated treatment well;No increased pain Patient left: in bed;with family/visitor present;with nursing/sitter in room Nurse Communication: Mobility status PT Visit Diagnosis: Unsteadiness on feet (R26.81);Muscle weakness (generalized) (M62.81)    Time: 1914-7829 PT Time Calculation (min) (ACUTE ONLY): 23 min   Charges:   PT Evaluation $PT Eval Moderate Complexity: 1 Mod   PT General Charges $$ ACUTE PT VISIT: 1 Visit       11:26 AM, 11/27/22 Rosamaria Lints, PT, DPT Physical Therapist - [address] [facility]  [phone] (ASCOM)    Kyle Roberson 11/27/2022, 11:23 AM

## 2022-11-28 ENCOUNTER — Inpatient Hospital Stay: Payer: Medicare HMO

## 2022-11-28 DIAGNOSIS — I272 Pulmonary hypertension, unspecified: Secondary | ICD-10-CM

## 2022-11-28 DIAGNOSIS — E8770 Fluid overload, unspecified: Secondary | ICD-10-CM | POA: Diagnosis not present

## 2022-11-28 DIAGNOSIS — I5033 Acute on chronic diastolic (congestive) heart failure: Secondary | ICD-10-CM | POA: Diagnosis not present

## 2022-11-28 DIAGNOSIS — E1122 Type 2 diabetes mellitus with diabetic chronic kidney disease: Secondary | ICD-10-CM | POA: Diagnosis not present

## 2022-11-28 LAB — GLUCOSE, CAPILLARY: Glucose-Capillary: 138 mg/dL — ABNORMAL HIGH (ref 70–99)

## 2022-11-28 MED ORDER — IPRATROPIUM-ALBUTEROL 0.5-2.5 (3) MG/3ML IN SOLN
3.0000 mL | Freq: Four times a day (QID) | RESPIRATORY_TRACT | Status: DC | PRN
  Administered 2022-11-28: 3 mL via RESPIRATORY_TRACT
  Filled 2022-11-28: qty 3

## 2022-11-28 MED ORDER — CALAMINE EX LOTN
1.0000 | TOPICAL_LOTION | CUTANEOUS | Status: DC | PRN
  Filled 2022-11-28: qty 177

## 2022-11-28 NOTE — Evaluation (Signed)
Occupational Therapy Evaluation Patient Details Name: Kyle Roberson MRN: [id number] DOB: April 17, 1940 Today's Date: 11/28/2022   History of Present Illness Kyle Roberson is an 81yoM who comes to [facility] from OP nephrology on 9/5 due to LEE and ABD swelling. PMH: CKD, CHF.   Clinical Impression   Pt was seen for OT evaluation this date. Prior to hospital admission, pt was requiring moderate assist for ADL from spouse and using a QC or no AD for short household mobility. Pt lives with his wife in a ramped entrance home and has a shower chair to sit on for bathing. Pt presents to acute OT demonstrating impaired ADL performance and functional mobility 2/2 decreased strength, L ankle pain limiting ROM, impaired balance, activity tolerance, and need for supplemental O2 this date to maintain SpO2 >91% (See OT problem list for additional functional deficits). Pt currently requires MAX A for sup>sit, MOD A for STS with RW, CGA for taking a few steps forward and backward, and MAX A +2 for lateral scoots EOB. MAX A for LB ADL tasks, MIN A for seated UB ADL tasks. Set up for grooming. Pt would benefit from skilled OT services to address noted impairments and functional limitations (see below for any additional details) in order to maximize safety and independence while minimizing falls risk and caregiver burden.     If plan is discharge home, recommend the following: A lot of help with walking and/or transfers;A lot of help with bathing/dressing/bathroom;Assistance with cooking/housework;Assist for transportation;Help with stairs or ramp for entrance    Functional Status Assessment  Patient has had a recent decline in their functional status and demonstrates the ability to make significant improvements in function in a reasonable and predictable amount of time.  Equipment Recommendations  Other (comment) (defer to next venue of care)    Recommendations for Other Services       Precautions / Restrictions  Precautions Precautions: Fall Restrictions Weight Bearing Restrictions: No      Mobility Bed Mobility Overal bed mobility: Needs Assistance Bed Mobility: Supine to Sit     Supine to sit: HOB elevated, Used rails, Max assist     General bed mobility comments: very difficult    Transfers Overall transfer level: Needs assistance Equipment used: Rolling walker (2 wheels) Transfers: Sit to/from Stand, Bed to chair/wheelchair/BSC Sit to Stand: Mod assist          Lateral/Scoot Transfers: Max assist, +2 physical assistance General transfer comment: VC for hand placement      Balance Overall balance assessment: Needs assistance Sitting-balance support: No upper extremity supported, Single extremity supported, Feet supported Sitting balance-Leahy Scale: Fair     Standing balance support: Reliant on assistive device for balance, Bilateral upper extremity supported Standing balance-Leahy Scale: Fair                             ADL either performed or assessed with clinical judgement   ADL Overall ADL's : Needs assistance/impaired     Grooming: Sitting;Set up;Supervision/safety;Wash/dry face;Oral care       Lower Body Bathing: Sitting/lateral leans;Moderate assistance;Maximal assistance                       Functional mobility during ADLs: Rolling walker (2 wheels);Minimal assistance;Moderate assistance       Vision         Perception         Praxis  Pertinent Vitals/Pain Pain Assessment Pain Assessment: 0-10 Pain Score: 8  Pain Location: L ankle Pain Descriptors / Indicators: Aching, Moaning Pain Intervention(s): Limited activity within patient's tolerance, Monitored during session, Repositioned     Extremity/Trunk Assessment Upper Extremity Assessment Upper Extremity Assessment: Generalized weakness   Lower Extremity Assessment Lower Extremity Assessment: Generalized weakness;LLE deficits/detail LLE Deficits /  Details: ankle pain limited with WBing LLE: Unable to fully assess due to pain       Communication     Cognition Arousal: Alert Behavior During Therapy: WFL for tasks assessed/performed Overall Cognitive Status: Within Functional Limits for tasks assessed                                       General Comments  SpO2 down to 91% on 2L O2 with exertion, otherwise in mid 90's    Exercises Other Exercises Other Exercises: Pt educated in PLB to support breath recovery, falls prevention   Shoulder Instructions      Home Living Family/patient expects to be discharged to:: Private residence Living Arrangements: Spouse/significant other Available Help at Discharge: Family;Available PRN/intermittently (wife works outside the home) Type of Home: House Home Access: Ramped entrance     Home Layout: One level     Bathroom Shower/Tub: Psychologist, counselling;Tub/shower unit   Bathroom Toilet: Handicapped height Bathroom Accessibility: Yes How Accessible: Accessible via wheelchair Home Equipment: Cane - quad;Shower seat;Grab bars - toilet;Grab bars - tub/shower;Hand held shower head;Wheelchair Financial trader (4 wheels)   Additional Comments: lift chair      Prior Functioning/Environment Prior Level of Function : Needs assist             Mobility Comments: per wife AMB withquad cane or nothing for household mobility; at baseline does not go out of house for mobility unless needed ADLs Comments: Wife assists with ADL (min-modA)        OT Problem List: Decreased strength;Pain;Decreased range of motion;Decreased activity tolerance;Impaired balance (sitting and/or standing);Decreased knowledge of use of DME or AE;Obesity;Cardiopulmonary status limiting activity      OT Treatment/Interventions: Self-care/ADL training;Therapeutic exercise;Therapeutic activities;DME and/or AE instruction;Energy conservation;Patient/family education;Balance training    OT Goals(Current  goals can be found in the care plan section) Acute Rehab OT Goals Patient Stated Goal: go home OT Goal Formulation: With patient/family Time For Goal Achievement: 12/12/22 Potential to Achieve Goals: Good ADL Goals Pt Will Perform Lower Body Dressing: with caregiver independent in assisting;sit to/from stand Pt Will Transfer to Toilet: ambulating;bedside commode;with supervision (LRAD) Pt Will Perform Toileting - Clothing Manipulation and hygiene: with caregiver independent in assisting Additional ADL Goal #1: Pt will verbalize plan to implement at least 2 learned ECS/falls prevention strategies to maximize safety/indep with ADL and mobility.  OT Frequency: Min 1X/week    Co-evaluation              AM-PAC OT "6 Clicks" Daily Activity     Outcome Measure Help from another person eating meals?: None Help from another person taking care of personal grooming?: A Little Help from another person toileting, which includes using toliet, bedpan, or urinal?: A Lot Help from another person bathing (including washing, rinsing, drying)?: A Lot Help from another person to put on and taking off regular upper body clothing?: A Little Help from another person to put on and taking off regular lower body clothing?: A Lot 6 Click Score: 16   End of Session Equipment  Utilized During Treatment: Rolling walker (2 wheels);Oxygen  Activity Tolerance: Patient tolerated treatment well Patient left: in bed;with call bell/phone within reach;with family/visitor present;with nursing/sitter in room (seated EOB with nursing for bath and linens change)  OT Visit Diagnosis: Other abnormalities of gait and mobility (R26.89);Muscle weakness (generalized) (M62.81);Pain Pain - Right/Left: Left Pain - part of body: Ankle and joints of foot                Time: 2440-1027 OT Time Calculation (min): 33 min Charges:  OT General Charges $OT Visit: 1 Visit OT Evaluation $OT Eval Moderate Complexity: 1 Mod OT  Treatments $Self Care/Home Management : 8-22 mins  Arman Filter., MPH, MS, OTR/L ascom [phone] 11/28/22, 3:04 PM

## 2022-11-28 NOTE — Progress Notes (Signed)
Rounding Note    Patient Name: Kyle Roberson Date of Encounter: 11/28/2022   [facility] Cardiologist: Julien Nordmann, MD   Subjective   Significant abdominal distention Continued shortness of breath, difficulty maneuvering around the bed Family at the bedside Remains on Lasix infusion, 3 L negative  Inpatient Medications    Scheduled Meds:  apixaban  2.5 mg Oral BID   cholecalciferol  2,000 Units Oral Daily   dapagliflozin propanediol  10 mg Oral Daily   docusate sodium  100 mg Oral BID   doxazosin  1 mg Oral BID   hydrALAZINE  100 mg Oral TID   isosorbide mononitrate  30 mg Oral TID AC   multivitamin with minerals  1 tablet Oral Daily   neomycin-polymyxin b-dexamethasone  1 Application Right Eye TID   omega-3 acid ethyl esters  1 g Oral Daily   rosuvastatin  10 mg Oral Once per day on Monday Wednesday Friday   sodium chloride flush  3 mL Intravenous Q12H   Continuous Infusions:  sodium chloride     furosemide (LASIX) 200 mg in dextrose 5 % 100 mL (2 mg/mL) infusion 6 mg/hr (11/28/22 0345)   PRN Meds: sodium chloride, acetaminophen **OR** acetaminophen, calamine, ipratropium-albuterol, ondansetron **OR** ondansetron (ZOFRAN) IV, polyethylene glycol, sodium chloride flush, traZODone   Vital Signs    Vitals:   11/28/22 0500 11/28/22 0527 11/28/22 0756 11/28/22 1104  BP:  (!) 164/73 (!) 107/92 (!) 129/103  Pulse:  65 71 61  Resp:  19 (!) 24 20  Temp:  97.8 F (36.6 C) 97.9 F (36.6 C) 98 F (36.7 C)  TempSrc:   Oral Oral  SpO2:  96% 96% 97%  Weight: 111.5 kg     Height:        Intake/Output Summary (Last 24 hours) at 11/28/2022 1339 Last data filed at 11/28/2022 1104 Gross per 24 hour  Intake 459.47 ml  Output 2400 ml  Net -1940.53 ml      11/28/2022    5:00 AM 11/26/2022    3:51 PM 08/24/2022    2:16 PM  Last 3 Weights  Weight (lbs) 245 lb 13 oz 250 lb 242 lb  Weight (kg) 111.5 kg 113.399 kg 109.77 kg      Telemetry    Atrial fibrillation  rate 50s to 70s- Personally Reviewed  ECG     - Personally Reviewed  Physical Exam   GEN: No acute distress.   Neck: Unable to estimate JVD Cardiac: RRR, no murmurs, rubs, or gallops.  Respiratory: Clear to auscultation bilaterally.  Dullness at the bases GI: Soft, nontender, distended MS: 2+ lower extremity edema; No deformity. Neuro:  Nonfocal, Psych: Normal affect   Labs    High Sensitivity Troponin:  No results for input(s): "TROPONINIHS" in the last 720 hours.   Chemistry Recent Labs  Lab 11/26/22 1555 11/27/22 0245  NA 140  --   K 3.9  --   CL 105  --   CO2 25  --   GLUCOSE 124*  --   BUN 59*  --   CREATININE 2.85* 2.94*  CALCIUM 8.8*  --   GFRNONAA 21* 20*  ANIONGAP 10  --     Lipids No results for input(s): "CHOL", "TRIG", "HDL", "LABVLDL", "LDLCALC", "CHOLHDL" in the last 168 hours.  Hematology Recent Labs  Lab 11/26/22 1555 11/27/22 0245  WBC 7.4 7.2  RBC 3.60* 3.65*  HGB 10.9* 11.0*  HCT 34.0* 35.0*  MCV 94.4 95.9  MCH 30.3 30.1  MCHC 32.1 31.4  RDW 14.8 14.8  PLT 167 173   Thyroid No results for input(s): "TSH", "FREET4" in the last 168 hours.  BNP Recent Labs  Lab 11/26/22 1555  BNP 238.6*    DDimer No results for input(s): "DDIMER" in the last 168 hours.   Radiology    ECHOCARDIOGRAM COMPLETE  Result Date: 11/27/2022    ECHOCARDIOGRAM REPORT   Patient Name:   Kyle Roberson Date of Exam: 11/27/2022 Medical Rec #:  [id number]         Height:       68.0 in Accession #:    [id number]        Weight:       250.0 lb Date of Birth:  02-10-1941        BSA:          2.247 m Patient Age:    81 years          BP:           160/68 mmHg Patient Gender: M                 HR:           62 bpm. Exam Location:  [facility] Procedure: 2D Echo, Cardiac Doppler and Color Doppler Indications:     CHF  History:         Patient has prior history of Echocardiogram examinations, most                  recent 02/09/2022. CHF, CAD, Pulmonary HTN, Arrythmias:Atrial                   Fibrillation; Risk Factors:Hypertension, Diabetes and                  Dyslipidemia. CKD.  Sonographer:     Mikki Harbor Referring Phys:  [id number] SONA PATEL Diagnosing Phys: Debbe Odea MD  Sonographer Comments: Suboptimal subcostal window. IMPRESSIONS  1. Left ventricular ejection fraction, by estimation, is 55 to 60%. The left ventricle has normal function. The left ventricle has no regional wall motion abnormalities. There is mild left ventricular hypertrophy. Left ventricular diastolic parameters are indeterminate.  2. Right ventricular systolic function is normal. The right ventricular size is moderately enlarged. There is severely elevated pulmonary artery systolic pressure. The estimated right ventricular systolic pressure is 73.4 mmHg.  3. Left atrial size was severely dilated.  4. Right atrial size was moderately dilated.  5. The mitral valve is normal in structure. Mild mitral valve regurgitation.  6. Tricuspid valve regurgitation is mild to moderate.  7. The aortic valve is tricuspid. Aortic valve regurgitation is mild. Aortic valve sclerosis is present, with no evidence of aortic valve stenosis.  8. The inferior vena cava is dilated in size with <50% respiratory variability, suggesting right atrial pressure of 15 mmHg. FINDINGS  Left Ventricle: Left ventricular ejection fraction, by estimation, is 55 to 60%. The left ventricle has normal function. The left ventricle has no regional wall motion abnormalities. The left ventricular internal cavity size was normal in size. There is  mild left ventricular hypertrophy. Left ventricular diastolic parameters are indeterminate. Right Ventricle: The right ventricular size is moderately enlarged. No increase in right ventricular wall thickness. Right ventricular systolic function is normal. There is severely elevated pulmonary artery systolic pressure. The tricuspid regurgitant velocity is 3.82 m/s, and with an assumed right atrial pressure of 15  mmHg, the estimated right ventricular systolic pressure is 73.4 mmHg. Left  Atrium: Left atrial size was severely dilated. Right Atrium: Right atrial size was moderately dilated. Pericardium: There is no evidence of pericardial effusion. Mitral Valve: The mitral valve is normal in structure. Mild mitral valve regurgitation. MV peak gradient, 7.3 mmHg. The mean mitral valve gradient is 2.0 mmHg. Tricuspid Valve: The tricuspid valve is normal in structure. Tricuspid valve regurgitation is mild to moderate. Aortic Valve: The aortic valve is tricuspid. Aortic valve regurgitation is mild. Aortic valve sclerosis is present, with no evidence of aortic valve stenosis. Aortic valve mean gradient measures 7.0 mmHg. Aortic valve peak gradient measures 13.0 mmHg. Aortic valve area, by VTI measures 2.14 cm. Pulmonic Valve: The pulmonic valve was normal in structure. Pulmonic valve regurgitation is mild. Aorta: The aortic root is normal in size and structure. Venous: The inferior vena cava is dilated in size with less than 50% respiratory variability, suggesting right atrial pressure of 15 mmHg. IAS/Shunts: No atrial level shunt detected by color flow Doppler.  LEFT VENTRICLE PLAX 2D LVIDd:         5.10 cm LVIDs:         3.20 cm LV PW:         1.30 cm LV IVS:        1.40 cm LVOT diam:     2.10 cm LV SV:         93 LV SV Index:   41 LVOT Area:     3.46 cm  RIGHT VENTRICLE RV Basal diam:  5.50 cm RV Mid diam:    3.90 cm LEFT ATRIUM              Index        RIGHT ATRIUM           Index LA diam:        4.80 cm  2.14 cm/m   RA Area:     28.30 cm LA Vol (A2C):   131.0 ml 58.30 ml/m  RA Volume:   106.00 ml 47.17 ml/m LA Vol (A4C):   112.0 ml 49.84 ml/m LA Biplane Vol: 122.0 ml 54.29 ml/m  AORTIC VALVE                     PULMONIC VALVE AV Area (Vmax):    1.98 cm      PV Vmax:       1.17 m/s AV Area (Vmean):   2.04 cm      PV Peak grad:  5.5 mmHg AV Area (VTI):     2.14 cm AV Vmax:           180.00 cm/s AV Vmean:           127.000 cm/s AV VTI:            0.434 m AV Peak Grad:      13.0 mmHg AV Mean Grad:      7.0 mmHg LVOT Vmax:         103.00 cm/s LVOT Vmean:        74.900 cm/s LVOT VTI:          0.268 m LVOT/AV VTI ratio: 0.62  AORTA Ao Root diam: 3.20 cm Ao Asc diam:  3.60 cm MITRAL VALVE                TRICUSPID VALVE MV Area (PHT): 3.74 cm     TR Peak grad:   58.4 mmHg MV Area VTI:   2.37 cm     TR Vmax:  382.00 cm/s MV Peak grad:  7.3 mmHg MV Mean grad:  2.0 mmHg     SHUNTS MV Vmax:       1.35 m/s     Systemic VTI:  0.27 m MV Vmean:      53.9 cm/s    Systemic Diam: 2.10 cm MV Decel Time: 203 msec MV E velocity: 119.00 cm/s Debbe Odea MD Electronically signed by Debbe Odea MD Signature Date/Time: 11/27/2022/4:36:35 PM    Final     Cardiac Studies   Echo as above  Patient Profile     Dayven Himmelman is a 82 y.o. 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 Likely a component of Group II/III. -Lasix infusion as above   CAD: - Hx remote stenting to LAD and diagonal - Not candidate for cath with CKD IV -Continue statin -No aspirin since  anticoagulated on Eliquis 2.5 twice daily -Denies chest pain concerning for angina   Permanent atrial fibrillation: - Rate 50 -70s without rate control meds - On Eliquis 2.5 mg BID (appropriate dose for age and renal function) - No bleeding  Details above discussed with family at the bedside  Total encounter time more than 50 minutes  Greater than 50% was spent in counseling and coordination of care with the patient   For questions or updates, please contact [facility] Please consult [email or web address] for contact info under        Signed, Julien Nordmann, MD  11/28/2022, 1:39 PM

## 2022-11-28 NOTE — Progress Notes (Signed)
pts HR has been dipping into the 40's and 30s this afternoon. pts cxr was unremarkable but his wob and wheezing seems increased  1 min TG Kyle Iba, MD Not much to do about low heart rate as he is not on anything to stop. Suspect rate may drop when he is sleeping, sedentary. Will just have to watch for now

## 2022-11-28 NOTE — Progress Notes (Signed)
[facility]  - [place] [facility]   PATIENT NAME: Kyle Roberson    MR#:  [id number]  DATE OF BIRTH:  1940/10/23  SUBJECTIVE:   Wife at bedside.  Diuresing well. A bit fatigued  VITALS:  Blood pressure (!) 129/103, pulse 61, temperature 98 F (36.7 C), temperature source Oral, resp. rate 20, height 5\' 8"  (1.727 m), weight 111.5 kg, SpO2 97%.  PHYSICAL EXAMINATION:   GENERAL:  82 y.o.-year-old patient with no acute distress. Obese LUNGS: Normal breath sounds bilaterally, mild wheezing CARDIOVASCULAR: S1, S2 normal. No murmur   ABDOMEN: Soft, nontender, abdominal obesity EXTREMITIES: bilateral chronic lymphedema with significant swelling NEUROLOGIC: nonfocal  patient is alert and awake  LABORATORY PANEL:  CBC Recent Labs  Lab 11/27/22 0245  WBC 7.2  HGB 11.0*  HCT 35.0*  PLT 173    Chemistries  Recent Labs  Lab 11/26/22 1555 11/27/22 0245  NA 140  --   K 3.9  --   CL 105  --   CO2 25  --   GLUCOSE 124*  --   BUN 59*  --   CREATININE 2.85* 2.94*  CALCIUM 8.8*  --    Assessment and Plan   Kaisen Huesman is a 82 y.o. 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 Garnett Farm office with increasing weight gain, bilateral lower extremity swelling and shortness of breath.  Patient's baseline weight is 220 pounds. Weight and nephrology office was 255 pounds. Patient is being admitted with acute on chronic diastolic heart failure.   Acute on chronic diastolic congestive heart failure -- patient came in with volume overload with increasing weight up to 255 pounds. Dry weight around ~220 pounds -- last echo was done in December 2023 which showed elevated right ventricular pressure and severe pulmonary hypertension -- will repeat  echo -- cardiology consultation with [facility] heart failure Dr Larina Bras -- patient received Lasix 60 mg IV once will start patient on Lasix drip 6 mg/hr Dr. Doristine Church recommendation -- monitor input output and creatinine -- diuresing well --pt does NOT wear oxygen at home per wife --9/7--Lasix gtt increased to 8 mg/hr. mild wheezing. Added duonebs. Wife worried about hypoglycemia does not want sliding scale insulin while patient in house. Will change CBG three times a day. --now on Farxiga per Cards rec    chronic kidney disease stage IV -- baseline creatinine around 2.5-- 2.7 -- creatinine 2.85 -- nephrology consultation with Dr. 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 -- sliding scale insulin--d/ced per wifes request -- on farxiga --At home also on glipizide xl 5 mg--holding it   Morbid obesity -- complicates overall prognosis   Hypertension -- reusmed home meds   Hyperlipidemia -- statins   CAD with remote PCI -- continue statins   History of paroxysmal a fib -- rate well-controlled -- patient on eliquis    Advance Care Planning:   Code Status: Full Code discussed with patient and wife in the ER  Consults: cardiology, nephrology  Family Communication: wife at bedside in the ER  DVT Prophylaxis :eliquis Level of care: Telemetry Cardiac Status is: Inpatient Remains inpatient appropriate because: CHF on IV lasix gtt    TOTAL TIME TAKING CARE OF THIS PATIENT: 35 minutes.  >50% time spent  on counselling and coordination of care  Note: This dictation was prepared with Dragon dictation along with smaller phrase technology. Any transcriptional errors that result from this process are unintentional.  Enedina Finner M.D    [facility]   CC: Primary care physician; [name], MD

## 2022-11-28 NOTE — Progress Notes (Signed)
[facility] [place], [place] 11/28/22  Subjective:   Hospital day # 2  Patient known to our practice from outpatient follow-up.  He was sent over to the emergency room for admission due to severe volume overload.  Previous weight in the office in April 2024 was 237 pounds. At his last office visit on 11/25/2022, weight had increased to 255 pounds. Patient was referred to the emergency room for IV diuresis.  Patient seen sitting up in bed, wife at bedside Continues to complain of shortness of breath  Creatinine remained stable Adequate urine output Renal: 09/06 0701 - 09/07 0700 In: 459.5 [P.O.:360; I.V.:99.5] Out: 2550 [Urine:2550] Lab Results  Component Value Date   CREATININE 2.94 (H) 11/27/2022   CREATININE 2.85 (H) 11/26/2022   CREATININE 3.04 (H) 08/24/2022   IV furosemide drip at 6 mg/h  Objective:  Vital signs in last 24 hours:  Temp:  [97.4 F (36.3 C)-98 F (36.7 C)] 98 F (36.7 C) (09/07 1104) Pulse Rate:  [56-71] 61 (09/07 1104) Resp:  [17-24] 20 (09/07 1104) BP: (107-178)/(62-103) 129/103 (09/07 1104) SpO2:  [94 %-99 %] 97 % (09/07 1104) Weight:  [111.5 kg] 111.5 kg (09/07 0500)  Weight change: -1.899 kg Filed Weights   11/26/22 1551 11/28/22 0500  Weight: 113.4 kg 111.5 kg    Intake/Output:    Intake/Output Summary (Last 24 hours) at 11/28/2022 1319 Last data filed at 11/28/2022 0503 Gross per 24 hour  Intake 459.47 ml  Output 1600 ml  Net -1140.53 ml     Physical Exam: General: Elderly gentleman, laying in the bed  HEENT Moist oral mucous membranes, lower eyelid conjunctival erythema.  Pulm/lungs Normal breathing effort, Wheezing  CVS/Heart Irregular, bradycardic  Abdomen:  Soft, nontender  Extremities: Significant edema to lower abdomen bilaterally  Neurologic: Alert, oriented  Skin: Skin changes from chronic edema.          Basic Metabolic Panel:  Recent Labs  Lab 11/26/22 1555 11/27/22 0245  NA 140  --   K  3.9  --   CL 105  --   CO2 25  --   GLUCOSE 124*  --   BUN 59*  --   CREATININE 2.85* 2.94*  CALCIUM 8.8*  --      CBC: Recent Labs  Lab 11/26/22 1555 11/27/22 0245  WBC 7.4 7.2  NEUTROABS 5.6  --   HGB 10.9* 11.0*  HCT 34.0* 35.0*  MCV 94.4 95.9  PLT 167 173     No results found for: "HEPBSAG", "HEPBSAB", "HEPBIGM"    Microbiology:  No results found for this or any previous visit (from the past 240 hour(s)).  Coagulation Studies: No results for input(s): "LABPROT", "INR" in the last 72 hours.  Urinalysis: No results for input(s): "COLORURINE", "LABSPEC", "PHURINE", "GLUCOSEU", "HGBUR", "BILIRUBINUR", "KETONESUR", "PROTEINUR", "UROBILINOGEN", "NITRITE", "LEUKOCYTESUR" in the last 72 hours.  Invalid input(s): "APPERANCEUR"    Imaging: ECHOCARDIOGRAM COMPLETE  Result Date: 11/27/2022    ECHOCARDIOGRAM REPORT   Patient Name:   Kyle Roberson Date of Exam: 11/27/2022 Medical Rec #:  [id number]         Height:       68.0 in Accession #:    [id number]        Weight:       250.0 lb Date of Birth:  [birth year]        BSA:          2.247 m Patient Age:    82 years  BP:           160/68 mmHg Patient Gender: M                 HR:           62 bpm. Exam Location:  [facility] Procedure: 2D Echo, Cardiac Doppler and Color Doppler Indications:     CHF  History:         Patient has prior history of Echocardiogram examinations, most                  recent 02/09/2022. CHF, CAD, Pulmonary HTN, Arrythmias:Atrial                  Fibrillation; Risk Factors:Hypertension, Diabetes and                  Dyslipidemia. CKD.  Sonographer:     Mikki Harbor Referring Phys:  [id number] SONA PATEL Diagnosing Phys: Debbe Odea MD  Sonographer Comments: Suboptimal subcostal window. IMPRESSIONS  1. Left ventricular ejection fraction, by estimation, is 55 to 60%. The left ventricle has normal function. The left ventricle has no regional wall motion abnormalities. There is mild left ventricular  hypertrophy. Left ventricular diastolic parameters are indeterminate.  2. Right ventricular systolic function is normal. The right ventricular size is moderately enlarged. There is severely elevated pulmonary artery systolic pressure. The estimated right ventricular systolic pressure is 73.4 mmHg.  3. Left atrial size was severely dilated.  4. Right atrial size was moderately dilated.  5. The mitral valve is normal in structure. Mild mitral valve regurgitation.  6. Tricuspid valve regurgitation is mild to moderate.  7. The aortic valve is tricuspid. Aortic valve regurgitation is mild. Aortic valve sclerosis is present, with no evidence of aortic valve stenosis.  8. The inferior vena cava is dilated in size with <50% respiratory variability, suggesting right atrial pressure of 15 mmHg. FINDINGS  Left Ventricle: Left ventricular ejection fraction, by estimation, is 55 to 60%. The left ventricle has normal function. The left ventricle has no regional wall motion abnormalities. The left ventricular internal cavity size was normal in size. There is  mild left ventricular hypertrophy. Left ventricular diastolic parameters are indeterminate. Right Ventricle: The right ventricular size is moderately enlarged. No increase in right ventricular wall thickness. Right ventricular systolic function is normal. There is severely elevated pulmonary artery systolic pressure. The tricuspid regurgitant velocity is 3.82 m/s, and with an assumed right atrial pressure of 15 mmHg, the estimated right ventricular systolic pressure is 73.4 mmHg. Left Atrium: Left atrial size was severely dilated. Right Atrium: Right atrial size was moderately dilated. Pericardium: There is no evidence of pericardial effusion. Mitral Valve: The mitral valve is normal in structure. Mild mitral valve regurgitation. MV peak gradient, 7.3 mmHg. The mean mitral valve gradient is 2.0 mmHg. Tricuspid Valve: The tricuspid valve is normal in structure. Tricuspid valve  regurgitation is mild to moderate. Aortic Valve: The aortic valve is tricuspid. Aortic valve regurgitation is mild. Aortic valve sclerosis is present, with no evidence of aortic valve stenosis. Aortic valve mean gradient measures 7.0 mmHg. Aortic valve peak gradient measures 13.0 mmHg. Aortic valve area, by VTI measures 2.14 cm. Pulmonic Valve: The pulmonic valve was normal in structure. Pulmonic valve regurgitation is mild. Aorta: The aortic root is normal in size and structure. Venous: The inferior vena cava is dilated in size with less than 50% respiratory variability, suggesting right atrial pressure of 15 mmHg. IAS/Shunts: No atrial level shunt detected  by color flow Doppler.  LEFT VENTRICLE PLAX 2D LVIDd:         5.10 cm LVIDs:         3.20 cm LV PW:         1.30 cm LV IVS:        1.40 cm LVOT diam:     2.10 cm LV SV:         93 LV SV Index:   41 LVOT Area:     3.46 cm  RIGHT VENTRICLE RV Basal diam:  5.50 cm RV Mid diam:    3.90 cm LEFT ATRIUM              Index        RIGHT ATRIUM           Index LA diam:        4.80 cm  2.14 cm/m   RA Area:     28.30 cm LA Vol (A2C):   131.0 ml 58.30 ml/m  RA Volume:   106.00 ml 47.17 ml/m LA Vol (A4C):   112.0 ml 49.84 ml/m LA Biplane Vol: 122.0 ml 54.29 ml/m  AORTIC VALVE                     PULMONIC VALVE AV Area (Vmax):    1.98 cm      PV Vmax:       1.17 m/s AV Area (Vmean):   2.04 cm      PV Peak grad:  5.5 mmHg AV Area (VTI):     2.14 cm AV Vmax:           180.00 cm/s AV Vmean:          127.000 cm/s AV VTI:            0.434 m AV Peak Grad:      13.0 mmHg AV Mean Grad:      7.0 mmHg LVOT Vmax:         103.00 cm/s LVOT Vmean:        74.900 cm/s LVOT VTI:          0.268 m LVOT/AV VTI ratio: 0.62  AORTA Ao Root diam: 3.20 cm Ao Asc diam:  3.60 cm MITRAL VALVE                TRICUSPID VALVE MV Area (PHT): 3.74 cm     TR Peak grad:   58.4 mmHg MV Area VTI:   2.37 cm     TR Vmax:        382.00 cm/s MV Peak grad:  7.3 mmHg MV Mean grad:  2.0 mmHg     SHUNTS MV  Vmax:       1.35 m/s     Systemic VTI:  0.27 m MV Vmean:      53.9 cm/s    Systemic Diam: 2.10 cm MV Decel Time: 203 msec MV E velocity: 119.00 cm/s Debbe Odea MD Electronically signed by Debbe Odea MD Signature Date/Time: 11/27/2022/4:36:35 PM    Final      Medications:    sodium chloride     furosemide (LASIX) 200 mg in dextrose 5 % 100 mL (2 mg/mL) infusion 6 mg/hr (11/28/22 0345)    apixaban  2.5 mg Oral BID   cholecalciferol  2,000 Units Oral Daily   dapagliflozin propanediol  10 mg Oral Daily   docusate sodium  100 mg Oral BID   doxazosin  1 mg Oral BID  hydrALAZINE  100 mg Oral TID   isosorbide mononitrate  30 mg Oral TID AC   multivitamin with minerals  1 tablet Oral Daily   neomycin-polymyxin b-dexamethasone  1 Application Right Eye TID   omega-3 acid ethyl esters  1 g Oral Daily   rosuvastatin  10 mg Oral Once per day on Monday Wednesday Friday   sodium chloride flush  3 mL Intravenous Q12H   sodium chloride, acetaminophen **OR** acetaminophen, calamine, ipratropium-albuterol, ondansetron **OR** ondansetron (ZOFRAN) IV, polyethylene glycol, sodium chloride flush, traZODone  Assessment/ Plan:  82 y.o. male with type 2 diabetes, coronary disease, chronic kidney disease, chronic diastolic CHF, bilateral lower extremity lymphedema, permanent atrial fibrillation, pulmonary hypertension, history of subdural hematoma  admitted on 11/26/2022 for Acute on chronic diastolic CHF (congestive heart failure) (HCC) [I50.33] Hypervolemia, unspecified hypervolemia type [E87.70]  Volume overload, lower extremity edema in the setting of diastolic CHF exacerbation. -Continue IV furosemide infusion and monitor urine output closely. -Agree with starting SGLT2 inhibitor-dapagliflozin for CHF, diabetic nephropathy indication. -Avoid hypotension. -Target weight-220 pounds back in December however he was 237 pounds in April 2024.  -Will increase furosemide drip to 8 mg/h.  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 Continue dapagliflozin, rosuvastatin Anticoagulation with apixaban. Discontinue glipizide as patient was having hypoglycemic episodes at home. No acute need for dialysis    LOS: 2 Wendee Beavers 9/7/20241:19 PM  [facility] [place], [place] [phone]  Note: This note was prepared with Dragon dictation. Any transcription errors are unintentional

## 2022-11-28 NOTE — Progress Notes (Signed)
Pt has increased audible wheezing and is belly breathing. Notified md.

## 2022-11-29 DIAGNOSIS — I5033 Acute on chronic diastolic (congestive) heart failure: Secondary | ICD-10-CM | POA: Diagnosis not present

## 2022-11-29 DIAGNOSIS — E8770 Fluid overload, unspecified: Secondary | ICD-10-CM | POA: Diagnosis not present

## 2022-11-29 DIAGNOSIS — E1122 Type 2 diabetes mellitus with diabetic chronic kidney disease: Secondary | ICD-10-CM | POA: Diagnosis not present

## 2022-11-29 LAB — BASIC METABOLIC PANEL
Anion gap: 13 (ref 5–15)
BUN: 63 mg/dL — ABNORMAL HIGH (ref 8–23)
CO2: 26 mmol/L (ref 22–32)
Calcium: 8.6 mg/dL — ABNORMAL LOW (ref 8.9–10.3)
Chloride: 101 mmol/L (ref 98–111)
Creatinine, Ser: 2.98 mg/dL — ABNORMAL HIGH (ref 0.61–1.24)
GFR, Estimated: 20 mL/min — ABNORMAL LOW (ref >60)
Glucose, Bld: 146 mg/dL — ABNORMAL HIGH (ref 70–99)
Potassium: 3.5 mmol/L (ref 3.5–5.1)
Sodium: 140 mmol/L (ref 135–145)

## 2022-11-29 MED ORDER — POTASSIUM CHLORIDE CRYS ER 20 MEQ PO TBCR
40.0000 meq | EXTENDED_RELEASE_TABLET | ORAL | Status: AC
  Administered 2022-11-29: 40 meq via ORAL
  Filled 2022-11-29: qty 2

## 2022-11-29 NOTE — Plan of Care (Signed)

## 2022-11-29 NOTE — Progress Notes (Signed)
Rounding Note    Patient Name: Kyle Roberson Date of Encounter: 11/29/2022  [facility] Cardiologist: Julien Nordmann, MD   Subjective   Continues to have significant shortness of breath, abdominal distention -3 L in the past 24 hours on Lasix infusion Recent weight 255 pounds, baseline weight closer to 237 or less  Inpatient Medications    Scheduled Meds:  apixaban  2.5 mg Oral BID   cholecalciferol  2,000 Units Oral Daily   dapagliflozin propanediol  10 mg Oral Daily   docusate sodium  100 mg Oral BID   doxazosin  1 mg Oral BID   hydrALAZINE  100 mg Oral TID   isosorbide mononitrate  30 mg Oral TID AC   multivitamin with minerals  1 tablet Oral Daily   neomycin-polymyxin b-dexamethasone  1 Application Right Eye TID   omega-3 acid ethyl esters  1 g Oral Daily   rosuvastatin  10 mg Oral Once per day on Monday Wednesday Friday   sodium chloride flush  3 mL Intravenous Q12H   Continuous Infusions:  sodium chloride     furosemide (LASIX) 200 mg in dextrose 5 % 100 mL (2 mg/mL) infusion 8 mg/hr (11/29/22 0236)   PRN Meds: sodium chloride, acetaminophen **OR** acetaminophen, calamine, ipratropium-albuterol, ondansetron **OR** ondansetron (ZOFRAN) IV, polyethylene glycol, sodium chloride flush, traZODone   Vital Signs    Vitals:   11/28/22 2358 11/29/22 0419 11/29/22 0734 11/29/22 1212  BP: (!) 154/65 (!) 159/67 (!) 159/75 (!) 146/82  Pulse: 77 83 84 77  Resp: (!) 22 20 20 20   Temp: 98 F (36.7 C) (!) 97.5 F (36.4 C) 97.8 F (36.6 C) 97.7 F (36.5 C)  TempSrc:   Oral Oral  SpO2: 93% 91% 90% 90%  Weight:      Height:        Intake/Output Summary (Last 24 hours) at 11/29/2022 1238 Last data filed at 11/29/2022 0419 Gross per 24 hour  Intake 120 ml  Output 2250 ml  Net -2130 ml      11/28/2022    5:00 AM 11/26/2022    3:51 PM 08/24/2022    2:16 PM  Last 3 Weights  Weight (lbs) 245 lb 13 oz 250 lb 242 lb  Weight (kg) 111.5 kg 113.399 kg 109.77 kg       Telemetry    Atrial fibrillation rate 50s to 70s- Personally Reviewed  ECG     - Personally Reviewed  Physical Exam   GEN: No acute distress.   Neck: Unable to estimate JVD Cardiac: RRR, no murmurs, rubs, or gallops.  Respiratory: Clear to auscultation bilaterally.  Dullness at the bases GI: Soft, nontender, distended MS: 2+ lower extremity edema; No deformity. Neuro:  Nonfocal, Psych: Normal affect   Labs    High Sensitivity Troponin:  No results for input(s): "TROPONINIHS" in the last 720 hours.   Chemistry Recent Labs  Lab 11/26/22 1555 11/27/22 0245 11/29/22 0455  NA 140  --  140  K 3.9  --  3.5  CL 105  --  101  CO2 25  --  26  GLUCOSE 124*  --  146*  BUN 59*  --  63*  CREATININE 2.85* 2.94* 2.98*  CALCIUM 8.8*  --  8.6*  GFRNONAA 21* 20* 20*  ANIONGAP 10  --  13    Lipids No results for input(s): "CHOL", "TRIG", "HDL", "LABVLDL", "LDLCALC", "CHOLHDL" in the last 168 hours.  Hematology Recent Labs  Lab 11/26/22 1555 11/27/22 0245  WBC 7.4 7.2  RBC 3.60* 3.65*  HGB 10.9* 11.0*  HCT 34.0* 35.0*  MCV 94.4 95.9  MCH 30.3 30.1  MCHC 32.1 31.4  RDW 14.8 14.8  PLT 167 173   Thyroid No results for input(s): "TSH", "FREET4" in the last 168 hours.  BNP Recent Labs  Lab 11/26/22 1555  BNP 238.6*    DDimer No results for input(s): "DDIMER" in the last 168 hours.   Radiology    DG Chest Port 1 View  Result Date: 11/28/2022 CLINICAL DATA:  Wheezing. EXAM: PORTABLE CHEST 1 VIEW COMPARISON:  February 08, 2022 FINDINGS: Cardiomediastinal silhouette is enlarged. Mediastinal contours appear intact. Calcific atherosclerotic disease and tortuosity of the aorta. Moderate mixed pattern pulmonary edema. Osseous structures are without acute abnormality. Soft tissues are grossly normal. IMPRESSION: 1. Moderate mixed pattern pulmonary edema. 2. Enlarged cardiomediastinal silhouette. Electronically Signed   By: Ted Mcalpine M.D.   On: 11/28/2022 16:56    ECHOCARDIOGRAM COMPLETE  Result Date: 11/27/2022    ECHOCARDIOGRAM REPORT   Patient Name:   Kyle Roberson Date of Exam: 11/27/2022 Medical Rec #:  [id number]         Height:       68.0 in Accession #:    [id number]        Weight:       250.0 lb Date of Birth:  1940-06-16        BSA:          2.247 m Patient Age:    81 years          BP:           160/68 mmHg Patient Gender: M                 HR:           62 bpm. Exam Location:  [facility] Procedure: 2D Echo, Cardiac Doppler and Color Doppler Indications:     CHF  History:         Patient has prior history of Echocardiogram examinations, most                  recent 02/09/2022. CHF, CAD, Pulmonary HTN, Arrythmias:Atrial                  Fibrillation; Risk Factors:Hypertension, Diabetes and                  Dyslipidemia. CKD.  Sonographer:     Mikki Harbor Referring Phys:  [id number] SONA PATEL Diagnosing Phys: Debbe Odea MD  Sonographer Comments: Suboptimal subcostal window. IMPRESSIONS  1. Left ventricular ejection fraction, by estimation, is 55 to 60%. The left ventricle has normal function. The left ventricle has no regional wall motion abnormalities. There is mild left ventricular hypertrophy. Left ventricular diastolic parameters are indeterminate.  2. Right ventricular systolic function is normal. The right ventricular size is moderately enlarged. There is severely elevated pulmonary artery systolic pressure. The estimated right ventricular systolic pressure is 73.4 mmHg.  3. Left atrial size was severely dilated.  4. Right atrial size was moderately dilated.  5. The mitral valve is normal in structure. Mild mitral valve regurgitation.  6. Tricuspid valve regurgitation is mild to moderate.  7. The aortic valve is tricuspid. Aortic valve regurgitation is mild. Aortic valve sclerosis is present, with no evidence of aortic valve stenosis.  8. The inferior vena cava is dilated in size with <50% respiratory variability, suggesting right atrial pressure of 15  mmHg. FINDINGS  Left Ventricle: Left ventricular ejection fraction, by estimation, is 55 to 60%. The left ventricle has normal function. The left ventricle has no regional wall motion abnormalities. The left ventricular internal cavity size was normal in size. There is  mild left ventricular hypertrophy. Left ventricular diastolic parameters are indeterminate. Right Ventricle: The right ventricular size is moderately enlarged. No increase in right ventricular wall thickness. Right ventricular systolic function is normal. There is severely elevated pulmonary artery systolic pressure. The tricuspid regurgitant velocity is 3.82 m/s, and with an assumed right atrial pressure of 15 mmHg, the estimated right ventricular systolic pressure is 73.4 mmHg. Left Atrium: Left atrial size was severely dilated. Right Atrium: Right atrial size was moderately dilated. Pericardium: There is no evidence of pericardial effusion. Mitral Valve: The mitral valve is normal in structure. Mild mitral valve regurgitation. MV peak gradient, 7.3 mmHg. The mean mitral valve gradient is 2.0 mmHg. Tricuspid Valve: The tricuspid valve is normal in structure. Tricuspid valve regurgitation is mild to moderate. Aortic Valve: The aortic valve is tricuspid. Aortic valve regurgitation is mild. Aortic valve sclerosis is present, with no evidence of aortic valve stenosis. Aortic valve mean gradient measures 7.0 mmHg. Aortic valve peak gradient measures 13.0 mmHg. Aortic valve area, by VTI measures 2.14 cm. Pulmonic Valve: The pulmonic valve was normal in structure. Pulmonic valve regurgitation is mild. Aorta: The aortic root is normal in size and structure. Venous: The inferior vena cava is dilated in size with less than 50% respiratory variability, suggesting right atrial pressure of 15 mmHg. IAS/Shunts: No atrial level shunt detected by color flow Doppler.  LEFT VENTRICLE PLAX 2D LVIDd:         5.10 cm LVIDs:         3.20 cm LV PW:         1.30 cm LV  IVS:        1.40 cm LVOT diam:     2.10 cm LV SV:         93 LV SV Index:   41 LVOT Area:     3.46 cm  RIGHT VENTRICLE RV Basal diam:  5.50 cm RV Mid diam:    3.90 cm LEFT ATRIUM              Index        RIGHT ATRIUM           Index LA diam:        4.80 cm  2.14 cm/m   RA Area:     28.30 cm LA Vol (A2C):   131.0 ml 58.30 ml/m  RA Volume:   106.00 ml 47.17 ml/m LA Vol (A4C):   112.0 ml 49.84 ml/m LA Biplane Vol: 122.0 ml 54.29 ml/m  AORTIC VALVE                     PULMONIC VALVE AV Area (Vmax):    1.98 cm      PV Vmax:       1.17 m/s AV Area (Vmean):   2.04 cm      PV Peak grad:  5.5 mmHg AV Area (VTI):     2.14 cm AV Vmax:           180.00 cm/s AV Vmean:          127.000 cm/s AV VTI:            0.434 m AV Peak Grad:      13.0 mmHg AV Mean Grad:  7.0 mmHg LVOT Vmax:         103.00 cm/s LVOT Vmean:        74.900 cm/s LVOT VTI:          0.268 m LVOT/AV VTI ratio: 0.62  AORTA Ao Root diam: 3.20 cm Ao Asc diam:  3.60 cm MITRAL VALVE                TRICUSPID VALVE MV Area (PHT): 3.74 cm     TR Peak grad:   58.4 mmHg MV Area VTI:   2.37 cm     TR Vmax:        382.00 cm/s MV Peak grad:  7.3 mmHg MV Mean grad:  2.0 mmHg     SHUNTS MV Vmax:       1.35 m/s     Systemic VTI:  0.27 m MV Vmean:      53.9 cm/s    Systemic Diam: 2.10 cm MV Decel Time: 203 msec MV E velocity: 119.00 cm/s Debbe Odea MD Electronically signed by Debbe Odea MD Signature Date/Time: 11/27/2022/4:36:35 PM    Final     Cardiac Studies   Echo as above  Patient Profile     Adyan Harbeson is a 82 y.o. 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 is seen today for evaluation of acute on chronic diastolic heart failure   Assessment & Plan    Acute on chronic diastolic heart failure, chronic renal failure with cardiorenal syndrome:  Failed oral Lasix and furosix at home 25 pound weight  gain from baseline Family report compliance with diuretic regimen at home -Responding to Lasix infusion this admission, greater than 5 L negative so far -Will replete potassium -Farxiga 10 mg -Poor long-term prognosis given advanced CKD and debility -At discharge would likely benefit from torsemide daily, metolazone 2-3 x per week   Pulmonary hypertension:  Echo 11/23: EF 55-60%, interventricular septum flattened in systole consistent with RV pressure overload, RV function okay, RVSP severely elevated 83 mmHg(previously 56 mmhg in 2021), mild MR, moderate TR, dilated pulmonary artery, dilated IVC. Likely a component of Group II/III. -Lasix infusion as above   CAD: - Hx remote stenting to LAD and diagonal - Not candidate for cath with CKD IV -Continue statin -No aspirin since anticoagulated on Eliquis 2.5 twice daily No plan for ischemic workup   Permanent atrial fibrillation: - Rate 50 -70s without rate control meds - On Eliquis 2.5 mg BID (appropriate dose for age and renal function) -Bradycardia with sleeping, asymptomatic   Total encounter time more than 35 minutes  Greater than 50% was spent in counseling and coordination of care with the patient    For questions or updates, please contact [facility] Please consult [email or web address] for contact info under        Signed, Julien Nordmann, MD  11/29/2022, 12:38 PM

## 2022-11-29 NOTE — Progress Notes (Signed)
[facility]  - [place] at [facility]   PATIENT NAME: Kyle Roberson    MR#:  [id number]  DATE OF BIRTH:  03-04-1941  SUBJECTIVE:   Diuresing well. A bit fatigued  VITALS:  Blood pressure (!) 146/82, pulse 77, temperature 97.7 F (36.5 C), temperature source Oral, resp. rate 20, height 5\' 8"  (1.727 m), weight 111.5 kg, SpO2 90%.  PHYSICAL EXAMINATION:   GENERAL:  82 y.o.-year-old patient with no acute distress. Obese LUNGS: Normal breath sounds bilaterally, mild wheezing CARDIOVASCULAR: S1, S2 normal. No murmur   ABDOMEN: Soft, nontender, abdominal obesity EXTREMITIES: bilateral chronic lymphedema with significant swelling NEUROLOGIC: nonfocal  patient is alert and awake  LABORATORY PANEL:  CBC Recent Labs  Lab 11/27/22 0245  WBC 7.2  HGB 11.0*  HCT 35.0*  PLT 173    Chemistries  Recent Labs  Lab 11/29/22 0455  NA 140  K 3.5  CL 101  CO2 26  GLUCOSE 146*  BUN 63*  CREATININE 2.98*  CALCIUM 8.6*   Assessment and Plan   Kyle Roberson is a 82 y.o. 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 Garnett Farm office with increasing weight gain, bilateral lower extremity swelling and shortness of breath.  Patient's baseline weight is 220 pounds. Weight and nephrology office was 255 pounds. Patient is being admitted with acute on chronic diastolic heart failure.   Acute on chronic diastolic congestive heart failure Pulmonary HTN -- patient came in with volume overload with increasing weight up to 255 pounds. Dry weight around ~220 pounds -- last echo was done in December 2023 which showed elevated right ventricular pressure and severe pulmonary hypertension -- will repeat echo -- cardiology consultation with [facility] heart failure Dr  Larina Bras -- patient received Lasix 60 mg IV once will start patient on Lasix drip 6 mg/hr Dr. Doristine Church recommendation -- monitor input output and creatinine -- diuresing well --pt does NOT wear oxygen at home per wife --9/7--Lasix gtt increased to 8 mg/hr. mild wheezing. Added duonebs. Wife worried about hypoglycemia does not want sliding scale insulin while patient in house. Will change CBG three times a day. 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 -- sliding scale insulin--d/ced per wifes request -- on farxiga --At home also on glipizide xl 5 mg--holding it   Morbid obesity -- complicates overall prognosis   Hypertension -- reusmed home meds   Hyperlipidemia -- statins   CAD with remote PCI -- continue statins   History of paroxysmal a fib -- rate well-controlled -- patient on eliquis    Advance Care Planning:   Code Status: Full Code discussed with patient and wife in the ER  Consults: cardiology, nephrology  Family Communication: wife at bedside in the ER  DVT Prophylaxis :eliquis Level of care: Telemetry Cardiac Status is: Inpatient Remains inpatient appropriate because: CHF on IV lasix gtt    TOTAL TIME TAKING CARE OF THIS PATIENT: 35 minutes.  >50% time spent on counselling and coordination of care  Note: This dictation was prepared with Dragon dictation along with smaller phrase technology. Any transcriptional errors that result from this process are unintentional.  Enedina Finner M.D    [facility]   CC: Primary care physician; [name], MD

## 2022-11-29 NOTE — Progress Notes (Signed)
[facility] [place], [place] 11/29/22  Subjective:   Hospital day # 3  Patient known to our practice from outpatient follow-up.  He was sent over to the emergency room for admission due to severe volume overload.  Previous weight in the office in April 2024 was 237 pounds. At his last office visit on 11/25/2022, weight had increased to 255 pounds. Patient was referred to the emergency room for IV diuresis.  Patient sitting up in bed Alert and oriented No family present at bedside States he feels well, not excellent  Audible wheeze noted  Renal: 09/07 0701 - 09/08 0700 In: 120 [P.O.:120] Out: 3050 [Urine:3050] Lab Results  Component Value Date   CREATININE 2.98 (H) 11/29/2022   CREATININE 2.94 (H) 11/27/2022   CREATININE 2.85 (H) 11/26/2022   IV furosemide drip at 8 mg/h  Objective:  Vital signs in last 24 hours:  Temp:  [97.5 F (36.4 C)-98 F (36.7 C)] 97.8 F (36.6 C) (09/08 0734) Pulse Rate:  [51-84] 84 (09/08 0734) Resp:  [20-22] 20 (09/08 0734) BP: (129-159)/(59-103) 159/75 (09/08 0734) SpO2:  [90 %-100 %] 90 % (09/08 0734)  Weight change:  Filed Weights   11/26/22 1551 11/28/22 0500  Weight: 113.4 kg 111.5 kg    Intake/Output:    Intake/Output Summary (Last 24 hours) at 11/29/2022 0948 Last data filed at 11/29/2022 0419 Gross per 24 hour  Intake 120 ml  Output 3050 ml  Net -2930 ml     Physical Exam: General: Elderly gentleman, laying in the bed  HEENT Moist oral mucous membranes, lower eyelid conjunctival erythema.  Pulm/lungs Normal breathing effort, Wheezing  CVS/Heart Irregular, bradycardic  Abdomen:  Soft, nontender  Extremities: Significant edema to lower abdomen bilaterally  Neurologic: Alert, oriented  Skin: Skin changes from chronic edema.          Basic Metabolic Panel:  Recent Labs  Lab 11/26/22 1555 11/27/22 0245 11/29/22 0455  NA 140  --  140  K 3.9  --  3.5  CL 105  --  101  CO2 25  --  26  GLUCOSE  124*  --  146*  BUN 59*  --  63*  CREATININE 2.85* 2.94* 2.98*  CALCIUM 8.8*  --  8.6*     CBC: Recent Labs  Lab 11/26/22 1555 11/27/22 0245  WBC 7.4 7.2  NEUTROABS 5.6  --   HGB 10.9* 11.0*  HCT 34.0* 35.0*  MCV 94.4 95.9  PLT 167 173     No results found for: "HEPBSAG", "HEPBSAB", "HEPBIGM"    Microbiology:  No results found for this or any previous visit (from the past 240 hour(s)).  Coagulation Studies: No results for input(s): "LABPROT", "INR" in the last 72 hours.  Urinalysis: No results for input(s): "COLORURINE", "LABSPEC", "PHURINE", "GLUCOSEU", "HGBUR", "BILIRUBINUR", "KETONESUR", "PROTEINUR", "UROBILINOGEN", "NITRITE", "LEUKOCYTESUR" in the last 72 hours.  Invalid input(s): "APPERANCEUR"    Imaging: DG Chest Port 1 View  Result Date: 11/28/2022 CLINICAL DATA:  Wheezing. EXAM: PORTABLE CHEST 1 VIEW COMPARISON:  February 08, 2022 FINDINGS: Cardiomediastinal silhouette is enlarged. Mediastinal contours appear intact. Calcific atherosclerotic disease and tortuosity of the aorta. Moderate mixed pattern pulmonary edema. Osseous structures are without acute abnormality. Soft tissues are grossly normal. IMPRESSION: 1. Moderate mixed pattern pulmonary edema. 2. Enlarged cardiomediastinal silhouette. Electronically Signed   By: Ted Mcalpine M.D.   On: 11/28/2022 16:56   ECHOCARDIOGRAM COMPLETE  Result Date: 11/27/2022    ECHOCARDIOGRAM REPORT   Patient Name:   Kyle Roberson Date of Exam: 11/27/2022 Medical Rec #:  [id number]         Height:       68.0 in Accession #:    [id number]        Weight:       250.0 lb Date of Birth:  03-04-1941        BSA:          2.247 m Patient Age:    81 years          BP:           160/68 mmHg Patient Gender: M                 HR:           62 bpm. Exam Location:  [facility] Procedure: 2D Echo, Cardiac Doppler and Color Doppler Indications:     CHF  History:         Patient has prior history of Echocardiogram examinations, most                   recent 02/09/2022. CHF, CAD, Pulmonary HTN, Arrythmias:Atrial                  Fibrillation; Risk Factors:Hypertension, Diabetes and                  Dyslipidemia. CKD.  Sonographer:     Mikki Harbor Referring Phys:  [id number] SONA PATEL Diagnosing Phys: Debbe Odea MD  Sonographer Comments: Suboptimal subcostal window. IMPRESSIONS  1. Left ventricular ejection fraction, by estimation, is 55 to 60%. The left ventricle has normal function. The left ventricle has no regional wall motion abnormalities. There is mild left ventricular hypertrophy. Left ventricular diastolic parameters are indeterminate.  2. Right ventricular systolic function is normal. The right ventricular size is moderately enlarged. There is severely elevated pulmonary artery systolic pressure. The estimated right ventricular systolic pressure is 73.4 mmHg.  3. Left atrial size was severely dilated.  4. Right atrial size was moderately dilated.  5. The mitral valve is normal in structure. Mild mitral valve regurgitation.  6. Tricuspid valve regurgitation is mild to moderate.  7. The aortic valve is tricuspid. Aortic valve regurgitation is mild. Aortic valve sclerosis is present, with no evidence of aortic valve stenosis.  8. The inferior vena cava is dilated in size with <50% respiratory variability, suggesting right atrial pressure of 15 mmHg. FINDINGS  Left Ventricle: Left ventricular ejection fraction, by estimation, is 55 to 60%. The left ventricle has normal function. The left ventricle has no regional wall motion abnormalities. The left ventricular internal cavity size was normal in size. There is  mild left ventricular hypertrophy. Left ventricular diastolic parameters are indeterminate. Right Ventricle: The right ventricular size is moderately enlarged. No increase in right ventricular wall thickness. Right ventricular systolic function is normal. There is severely elevated pulmonary artery systolic pressure. The tricuspid  regurgitant velocity is 3.82 m/s, and with an assumed right atrial pressure of 15 mmHg, the estimated right ventricular systolic pressure is 73.4 mmHg. Left Atrium: Left atrial size was severely dilated. Right Atrium: Right atrial size was moderately dilated. Pericardium: There is no evidence of pericardial effusion. Mitral Valve: The mitral valve is normal in structure. Mild mitral valve regurgitation. MV peak gradient, 7.3 mmHg. The mean mitral valve gradient is 2.0 mmHg. Tricuspid Valve: The tricuspid valve is normal in structure. Tricuspid valve regurgitation is mild to moderate. Aortic Valve: The aortic valve is tricuspid. Aortic valve  regurgitation is mild. Aortic valve sclerosis is present, with no evidence of aortic valve stenosis. Aortic valve mean gradient measures 7.0 mmHg. Aortic valve peak gradient measures 13.0 mmHg. Aortic valve area, by VTI measures 2.14 cm. Pulmonic Valve: The pulmonic valve was normal in structure. Pulmonic valve regurgitation is mild. Aorta: The aortic root is normal in size and structure. Venous: The inferior vena cava is dilated in size with less than 50% respiratory variability, suggesting right atrial pressure of 15 mmHg. IAS/Shunts: No atrial level shunt detected by color flow Doppler.  LEFT VENTRICLE PLAX 2D LVIDd:         5.10 cm LVIDs:         3.20 cm LV PW:         1.30 cm LV IVS:        1.40 cm LVOT diam:     2.10 cm LV SV:         93 LV SV Index:   41 LVOT Area:     3.46 cm  RIGHT VENTRICLE RV Basal diam:  5.50 cm RV Mid diam:    3.90 cm LEFT ATRIUM              Index        RIGHT ATRIUM           Index LA diam:        4.80 cm  2.14 cm/m   RA Area:     28.30 cm LA Vol (A2C):   131.0 ml 58.30 ml/m  RA Volume:   106.00 ml 47.17 ml/m LA Vol (A4C):   112.0 ml 49.84 ml/m LA Biplane Vol: 122.0 ml 54.29 ml/m  AORTIC VALVE                     PULMONIC VALVE AV Area (Vmax):    1.98 cm      PV Vmax:       1.17 m/s AV Area (Vmean):   2.04 cm      PV Peak grad:  5.5 mmHg  AV Area (VTI):     2.14 cm AV Vmax:           180.00 cm/s AV Vmean:          127.000 cm/s AV VTI:            0.434 m AV Peak Grad:      13.0 mmHg AV Mean Grad:      7.0 mmHg LVOT Vmax:         103.00 cm/s LVOT Vmean:        74.900 cm/s LVOT VTI:          0.268 m LVOT/AV VTI ratio: 0.62  AORTA Ao Root diam: 3.20 cm Ao Asc diam:  3.60 cm MITRAL VALVE                TRICUSPID VALVE MV Area (PHT): 3.74 cm     TR Peak grad:   58.4 mmHg MV Area VTI:   2.37 cm     TR Vmax:        382.00 cm/s MV Peak grad:  7.3 mmHg MV Mean grad:  2.0 mmHg     SHUNTS MV Vmax:       1.35 m/s     Systemic VTI:  0.27 m MV Vmean:      53.9 cm/s    Systemic Diam: 2.10 cm MV Decel Time: 203 msec MV E velocity: 119.00 cm/s Debbe Odea MD  Electronically signed by Debbe Odea MD Signature Date/Time: 11/27/2022/4:36:35 PM    Final      Medications:    sodium chloride     furosemide (LASIX) 200 mg in dextrose 5 % 100 mL (2 mg/mL) infusion 8 mg/hr (11/29/22 0236)    apixaban  2.5 mg Oral BID   cholecalciferol  2,000 Units Oral Daily   dapagliflozin propanediol  10 mg Oral Daily   docusate sodium  100 mg Oral BID   doxazosin  1 mg Oral BID   hydrALAZINE  100 mg Oral TID   isosorbide mononitrate  30 mg Oral TID AC   multivitamin with minerals  1 tablet Oral Daily   neomycin-polymyxin b-dexamethasone  1 Application Right Eye TID   omega-3 acid ethyl esters  1 g Oral Daily   rosuvastatin  10 mg Oral Once per day on Monday Wednesday Friday   sodium chloride flush  3 mL Intravenous Q12H   sodium chloride, acetaminophen **OR** acetaminophen, calamine, ipratropium-albuterol, ondansetron **OR** ondansetron (ZOFRAN) IV, polyethylene glycol, sodium chloride flush, traZODone  Assessment/ Plan:  82 y.o. male with type 2 diabetes, coronary disease, chronic kidney disease, chronic diastolic CHF, bilateral lower extremity lymphedema, permanent atrial fibrillation, pulmonary hypertension, history of subdural hematoma  admitted on  11/26/2022 for Acute on chronic diastolic CHF (congestive heart failure) (HCC) [I50.33] Hypervolemia, unspecified hypervolemia type [E87.70]  Volume overload, lower extremity edema in the setting of diastolic CHF exacerbation. -Continue IV furosemide infusion and monitor urine output closely. -Agree with starting SGLT2 inhibitor-dapagliflozin for CHF, diabetic nephropathy indication. -Avoid hypotension. -Target weight-220 pounds back in December however he was 237 pounds in April 2024.  - Responding well to IV diuresis  - Good urine output noted, 3L  - Continue current treatments.   Chronic kidney disease stage IV secondary to type 2 diabetes Baseline creatinine is ranging between 2.8-3.0. Continue dapagliflozin, rosuvastatin Anticoagulation with apixaban. Discontinue glipizide as patient was having hypoglycemic episodes at home. Remains at baseline kidney function Will continue to monitor    LOS: 3 Wendee Beavers 9/8/20249:48 AM  [facility] [place], [place] [phone]

## 2022-11-30 DIAGNOSIS — I5033 Acute on chronic diastolic (congestive) heart failure: Secondary | ICD-10-CM | POA: Diagnosis not present

## 2022-11-30 LAB — GLUCOSE, CAPILLARY
Glucose-Capillary: 163 mg/dL — ABNORMAL HIGH (ref 70–99)
Glucose-Capillary: 224 mg/dL — ABNORMAL HIGH (ref 70–99)

## 2022-11-30 LAB — BASIC METABOLIC PANEL
Anion gap: 12 (ref 5–15)
BUN: 71 mg/dL — ABNORMAL HIGH (ref 8–23)
CO2: 27 mmol/L (ref 22–32)
Calcium: 8.4 mg/dL — ABNORMAL LOW (ref 8.9–10.3)
Chloride: 101 mmol/L (ref 98–111)
Creatinine, Ser: 3.07 mg/dL — ABNORMAL HIGH (ref 0.61–1.24)
GFR, Estimated: 19 mL/min — ABNORMAL LOW (ref >60)
Glucose, Bld: 216 mg/dL — ABNORMAL HIGH (ref 70–99)
Potassium: 3.8 mmol/L (ref 3.5–5.1)
Sodium: 140 mmol/L (ref 135–145)

## 2022-11-30 LAB — ALBUMIN: Albumin: 3.2 g/dL — ABNORMAL LOW (ref 3.5–5.0)

## 2022-11-30 LAB — MRSA NEXT GEN BY PCR, NASAL: MRSA by PCR Next Gen: NOT DETECTED

## 2022-11-30 MED ORDER — METOLAZONE 2.5 MG PO TABS
2.5000 mg | ORAL_TABLET | Freq: Once | ORAL | Status: AC
  Administered 2022-11-30: 2.5 mg via ORAL
  Filled 2022-11-30: qty 1

## 2022-11-30 MED ORDER — ALBUMIN HUMAN 25 % IV SOLN
25.0000 g | Freq: Once | INTRAVENOUS | Status: AC
  Administered 2022-11-30: 25 g via INTRAVENOUS
  Filled 2022-11-30: qty 100

## 2022-11-30 MED ORDER — EMPAGLIFLOZIN 10 MG PO TABS
10.0000 mg | ORAL_TABLET | Freq: Every day | ORAL | Status: DC
  Administered 2022-12-01 – 2022-12-04 (×3): 10 mg via ORAL
  Filled 2022-11-30 (×4): qty 1

## 2022-11-30 MED ORDER — CHLORHEXIDINE GLUCONATE CLOTH 2 % EX PADS
6.0000 | MEDICATED_PAD | Freq: Every day | CUTANEOUS | Status: DC
  Administered 2022-11-30 – 2023-01-03 (×30): 6 via TOPICAL

## 2022-11-30 MED ORDER — MILRINONE LACTATE IN DEXTROSE 20-5 MG/100ML-% IV SOLN
0.1250 ug/kg/min | INTRAVENOUS | Status: DC
  Administered 2022-11-30: 0.125 ug/kg/min via INTRAVENOUS
  Filled 2022-11-30: qty 100

## 2022-11-30 MED ORDER — POTASSIUM CHLORIDE CRYS ER 20 MEQ PO TBCR
40.0000 meq | EXTENDED_RELEASE_TABLET | Freq: Once | ORAL | Status: AC
  Administered 2022-11-30: 40 meq via ORAL
  Filled 2022-11-30: qty 2

## 2022-11-30 MED ORDER — POTASSIUM CHLORIDE CRYS ER 20 MEQ PO TBCR
20.0000 meq | EXTENDED_RELEASE_TABLET | Freq: Once | ORAL | Status: AC
  Administered 2022-11-30: 20 meq via ORAL
  Filled 2022-11-30: qty 1

## 2022-11-30 MED ORDER — ISOSORBIDE MONONITRATE ER 30 MG PO TB24
60.0000 mg | ORAL_TABLET | Freq: Every day | ORAL | Status: DC
  Administered 2022-11-30 – 2022-12-01 (×2): 60 mg via ORAL
  Filled 2022-11-30: qty 2
  Filled 2022-11-30 (×2): qty 1

## 2022-11-30 NOTE — Progress Notes (Signed)
Pt refused IV - any assess or attempt. RN aware.

## 2022-11-30 NOTE — Progress Notes (Signed)
Patient ID: Kyle Roberson, male   DOB: August 22, 1940, 82 y.o.   MRN: [id number]     Advanced Heart Failure Rounding Note  PCP-Cardiologist: Julien Nordmann, MD   Subjective:    Patient is tachypneic this morning, now on 2L oxygen.  Denies dyspnea at rest.  Some diuresis yesterday, weight not done.  Creatinine 2.98 => 3.07.  Remains on Lasix gtt 8 mg/hr.    Objective:   Weight Range: 111.5 kg Body mass index is 37.38 kg/m.   Vital Signs:   Temp:  [97.6 F (36.4 C)-98.8 F (37.1 C)] 97.6 F (36.4 C) (09/09 0845) Pulse Rate:  [57-77] 57 (09/09 0845) Resp:  [20-30] 22 (09/09 0845) BP: (131-146)/(54-82) 132/56 (09/09 0845) SpO2:  [90 %-98 %] 98 % (09/09 0845) Last BM Date : 11/29/22  Weight change: Filed Weights   11/26/22 1551 11/28/22 0500  Weight: 113.4 kg 111.5 kg    Intake/Output:   Intake/Output Summary (Last 24 hours) at 11/30/2022 0920 Last data filed at 11/30/2022 0500 Gross per 24 hour  Intake 377.9 ml  Output 1800 ml  Net -1422.1 ml      Physical Exam    General: Chronically ill-appearing.  HEENT: Normal Neck: Supple. JVP 16 cm. Carotids 2+ bilat; no bruits. No lymphadenopathy or thyromegaly appreciated. Cor: PMI nondisplaced. Irregular rate & rhythm. No rubs, gallops or murmurs. Lungs: Crackles at bases.  Abdomen: Soft, nontender, nondistended. No hepatosplenomegaly. No bruits or masses. Good bowel sounds. Extremities: No cyanosis, clubbing, rash. 2+ chronic edema to knees.  Neuro: Alert & orientedx3, cranial nerves grossly intact. moves all 4 extremities w/o difficulty. Affect pleasant   Telemetry   Atrial fibrillation rate 60s (personally reviewed)  Labs    CBC No results for input(s): "WBC", "NEUTROABS", "HGB", "HCT", "MCV", "PLT" in the last 72 hours. Basic Metabolic Panel Recent Labs    25/36/64 0455  NA 140  K 3.5  CL 101  CO2 26  GLUCOSE 146*  BUN 63*  CREATININE 2.98*  CALCIUM 8.6*   Liver Function Tests No results for  input(s): "AST", "ALT", "ALKPHOS", "BILITOT", "PROT", "ALBUMIN" in the last 72 hours. No results for input(s): "LIPASE", "AMYLASE" in the last 72 hours. Cardiac Enzymes No results for input(s): "CKTOTAL", "CKMB", "CKMBINDEX", "TROPONINI" in the last 72 hours.  BNP: BNP (last 3 results) Recent Labs    06/22/22 1613 08/24/22 1559 11/26/22 1555  BNP 171.0* 170.1* 238.6*    ProBNP (last 3 results) No results for input(s): "PROBNP" in the last 8760 hours.   D-Dimer No results for input(s): "DDIMER" in the last 72 hours. Hemoglobin A1C No results for input(s): "HGBA1C" in the last 72 hours. Fasting Lipid Panel No results for input(s): "CHOL", "HDL", "LDLCALC", "TRIG", "CHOLHDL", "LDLDIRECT" in the last 72 hours. Thyroid Function Tests No results for input(s): "TSH", "T4TOTAL", "T3FREE", "THYROIDAB" in the last 72 hours.  Invalid input(s): "FREET3"  Other results:   Imaging    No results found.   Medications:     Scheduled Medications:  apixaban  2.5 mg Oral BID   cholecalciferol  2,000 Units Oral Daily   dapagliflozin propanediol  10 mg Oral Daily   docusate sodium  100 mg Oral BID   doxazosin  1 mg Oral BID   hydrALAZINE  100 mg Oral TID   isosorbide mononitrate  60 mg Oral Daily   metolazone  2.5 mg Oral Once   multivitamin with minerals  1 tablet Oral Daily   neomycin-polymyxin b-dexamethasone  1 Application Right Eye TID  omega-3 acid ethyl esters  1 g Oral Daily   rosuvastatin  10 mg Oral Once per day on Monday Wednesday Friday   sodium chloride flush  3 mL Intravenous Q12H    Infusions:  sodium chloride     furosemide (LASIX) 200 mg in dextrose 5 % 100 mL (2 mg/mL) infusion 8 mg/hr (11/30/22 0457)    PRN Medications: sodium chloride, acetaminophen **OR** acetaminophen, calamine, ipratropium-albuterol, ondansetron **OR** ondansetron (ZOFRAN) IV, polyethylene glycol, sodium chloride flush, traZODone    Assessment/Plan   1. Acute on chronic  diastolic CHF: With prominent RV failure.  Echo this admission with EF 55-60%, mild LVH, moderate RV enlargement with mildly decreased RV systolic function, PASP 73 mmHg, severe LAE, mild-moderate TR, IVC dilated.  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 No weight yet today.  Creatinine mildly higher 2.98 => 3.07.  He is still quite volume overloaded on exam with tachypnea and oxygen requirement.  - Increase Lasix to 15 mg/hr and will give metolazone 2.5 x 1.  Will give KCl.  - Continue dapagliflozin for now.  - Patient may need milrinone for RV support while diuresing.  I would like to get RHC to assess filling pressures and cardiac output.  I will try to arrange for RHC tomorrow if possible, discussed risks/benefits with patient and he wants to think about procedure.  2. CKD stage IV: Complicates CHF.  Creatinine 2.98 => 3.07.   - Increasing diuresis given marked volume overload as above, may help renal function by lowering renal venous pressure.  - Milrinone for RV support may be helpful, would like RHC as noted above.  3.  Atrial fibrillation: Permanent. Reasonable rate control.  - He is on apixaban 2.5 bid.  4. CAD: Remote PCI to LAD and diagonal.  No chest pain.  - Continue statin - No ASA with apixaban use.  5. 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  As above, aim for RHC this admission.   Length of Stay: 4  Marca Ancona, MD  11/30/2022, 9:20 AM  Advanced Heart Failure Team Pager [phone] (M-F; 7a - 5p)  Please contact [facility] for night-coverage after hours (5p -7a ) and weekends on [email or web address]

## 2022-11-30 NOTE — Plan of Care (Signed)

## 2022-11-30 NOTE — Group Note (Deleted)

## 2022-11-30 NOTE — Progress Notes (Signed)
I spoke with patient this afternoon, he is now agreeable to RHC but unable to get on schedule tomorrow.  Will try to arrange for RHC on Wednesday.  In the mean time, with mildly higher creatinine today, will add empiric low dose milrinone 0.125 for RV support given significant RV failure.  Will try to avoid PICC with CKD 4.   Marca Ancona 11/30/2022 1:00 PM

## 2022-11-30 NOTE — Progress Notes (Signed)
[facility] [place], [place] 11/30/22  Subjective:   Hospital day # 4  Patient known to our practice from outpatient follow-up.  He was sent over to the emergency room for admission due to severe volume overload.  Previous weight in the office in April 2024 was 237 pounds. At his last office visit on 11/25/2022, weight had increased to 255 pounds. Patient was referred to the emergency room for IV diuresis.  Patient sitting up in bed Wife at bedside 2L [place]  Fair urine output  Renal: 09/08 0701 - 09/09 0700 In: 377.9 [P.O.:200; I.V.:177.9] Out: 1800 [Urine:1800] Lab Results  Component Value Date   CREATININE 3.07 (H) 11/30/2022   CREATININE 2.98 (H) 11/29/2022   CREATININE 2.94 (H) 11/27/2022   IV furosemide drip at 15 mg/h, increased by Cardiology  Objective:  Vital signs in last 24 hours:  Temp:  [97.6 F (36.4 C)-98.8 F (37.1 C)] 98.2 F (36.8 C) (09/09 1156) Pulse Rate:  [51-70] 51 (09/09 1156) Resp:  [20-30] 22 (09/09 0845) BP: (131-146)/(50-68) 136/50 (09/09 1156) SpO2:  [91 %-99 %] 99 % (09/09 1156)  Weight change:  Filed Weights   11/26/22 1551 11/28/22 0500  Weight: 113.4 kg 111.5 kg    Intake/Output:    Intake/Output Summary (Last 24 hours) at 11/30/2022 1416 Last data filed at 11/30/2022 0500 Gross per 24 hour  Intake 377.9 ml  Output 1800 ml  Net -1422.1 ml     Physical Exam: General: Elderly gentleman, laying in the bed  HEENT Moist oral mucous membranes, lower eyelid conjunctival erythema.  Pulm/lungs Normal breathing effort, Wheezing  CVS/Heart Irregular, bradycardic  Abdomen:  Soft, nontender  Extremities: Significant edema to lower abdomen bilaterally  Neurologic: Alert, oriented  Skin: Skin changes from chronic edema.          Basic Metabolic Panel:  Recent Labs  Lab 11/26/22 1555 11/27/22 0245 11/29/22 0455 11/30/22 0924  NA 140  --  140 140  K 3.9  --  3.5 3.8  CL 105  --  101 101  CO2 25  --  26 27   GLUCOSE 124*  --  146* 216*  BUN 59*  --  63* 71*  CREATININE 2.85* 2.94* 2.98* 3.07*  CALCIUM 8.8*  --  8.6* 8.4*     CBC: Recent Labs  Lab 11/26/22 1555 11/27/22 0245  WBC 7.4 7.2  NEUTROABS 5.6  --   HGB 10.9* 11.0*  HCT 34.0* 35.0*  MCV 94.4 95.9  PLT 167 173     No results found for: "HEPBSAG", "HEPBSAB", "HEPBIGM"    Microbiology:  No results found for this or any previous visit (from the past 240 hour(s)).  Coagulation Studies: No results for input(s): "LABPROT", "INR" in the last 72 hours.  Urinalysis: No results for input(s): "COLORURINE", "LABSPEC", "PHURINE", "GLUCOSEU", "HGBUR", "BILIRUBINUR", "KETONESUR", "PROTEINUR", "UROBILINOGEN", "NITRITE", "LEUKOCYTESUR" in the last 72 hours.  Invalid input(s): "APPERANCEUR"    Imaging: No results found.   Medications:    sodium chloride     furosemide (LASIX) 200 mg in dextrose 5 % 100 mL (2 mg/mL) infusion 15 mg/hr (11/30/22 0947)   milrinone      apixaban  2.5 mg Oral BID   cholecalciferol  2,000 Units Oral Daily   docusate sodium  100 mg Oral BID   doxazosin  1 mg Oral BID   [START ON 12/01/2022] empagliflozin  10 mg Oral Daily   hydrALAZINE  100 mg Oral TID   isosorbide mononitrate  60  mg Oral Daily   multivitamin with minerals  1 tablet Oral Daily   neomycin-polymyxin b-dexamethasone  1 Application Right Eye TID   omega-3 acid ethyl esters  1 g Oral Daily   potassium chloride  20 mEq Oral Once   rosuvastatin  10 mg Oral Once per day on Monday Wednesday Friday   sodium chloride flush  3 mL Intravenous Q12H   sodium chloride, acetaminophen **OR** acetaminophen, calamine, ipratropium-albuterol, ondansetron **OR** ondansetron (ZOFRAN) IV, polyethylene glycol, sodium chloride flush, traZODone  Assessment/ Plan:  82 y.o. male with type 2 diabetes, coronary disease, chronic kidney disease, chronic diastolic CHF, bilateral lower extremity lymphedema, permanent atrial fibrillation, pulmonary  hypertension, history of subdural hematoma  admitted on 11/26/2022 for Acute on chronic diastolic CHF (congestive heart failure) (HCC) [I50.33] Hypervolemia, unspecified hypervolemia type [E87.70]  Volume overload, lower extremity edema in the setting of diastolic CHF exacerbation. -Continue IV furosemide infusion and monitor urine output closely. -Agree with starting SGLT2 inhibitor-dapagliflozin for CHF, diabetic nephropathy indication. -Avoid hypotension. -Target weight-220 pounds back in December however he was 237 pounds in April 2024.  - IV furosemide increased to 50 mg/h per cardiology.  -Will order one-time dose IV albumin 25 g to mobilize fluid.  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 Continue dapagliflozin, rosuvastatin Anticoagulation with apixaban. Discontinue glipizide as patient was having hypoglycemic episodes at home. Creatinine slowly increasing.  Will continue to monitor and expect increased due to high dose IV furosemide drip.    LOS: 4 Kyle Roberson 9/9/20242:16 PM  [address] [place], [place] [phone]

## 2022-11-30 NOTE — Care Management Important Message (Signed)
Important Message  Patient Details  Name: Kyle Roberson MRN: [id number] Date of Birth: 04-Oct-1940   Medicare Important Message Given:  Yes     [name] 11/30/2022, 11:35 AM

## 2022-11-30 NOTE — TOC Progression Note (Signed)
Transition of Care [facility]) - Progression Note    Patient Details  Name: Kyle Roberson MRN: [id number] Date of Birth: 03/17/1941  Transition of Care [facility]) CM/SW Contact  Truddie Hidden, RN Phone Number: 11/30/2022, 3:33 PM  Clinical Narrative:    TOC continuing to follow patient's progress throughout discharge planning.   Expected Discharge Plan: Home/Self Care Barriers to Discharge: No Barriers Identified  Expected Discharge Plan and Services       Living arrangements for the past 2 months: Single Family Home                                       Social Determinants of Health (SDOH) Interventions SDOH Screenings   Food Insecurity: No Food Insecurity (02/11/2022)  Housing: Low Risk  (02/11/2022)  Transportation Needs: No Transportation Needs (02/11/2022)  Utilities: Not At Risk (02/11/2022)  Depression (PHQ2-9): Low Risk  (07/27/2022)  Tobacco Use: Medium Risk (11/26/2022)    Readmission Risk Interventions     No data to display

## 2022-11-30 NOTE — Progress Notes (Signed)
PT Cancellation Note  Patient Details Name: Kyle Roberson MRN: [id number] DOB: [birth year]   Cancelled Treatment:    Reason Eval/Treat Not Completed: Other (comment) Patient refused due to fatigue levels. PT to reattempt as able.    Levelle Edelen, PT, SPT 2:07 PM,11/30/22

## 2022-11-30 NOTE — Progress Notes (Addendum)
[facility]  - [place] [facility]   PATIENT NAME: Kyle Roberson    MR#:  [id number]  DATE OF BIRTH:  02/11/1941  SUBJECTIVE:  Wife at bedside patient appears fatigued. Urine output 1800 mL yesterday. Seen by [facility] MG heart failure cardiologist and recommends IV milrinone drip and right heart catheterization  VITALS:  Blood pressure (!) 133/57, pulse (!) 58, temperature (!) 97.3 F (36.3 C), temperature source Oral, resp. rate (!) 28, height 5\' 8"  (1.727 m), weight 111.5 kg, SpO2 99%.  PHYSICAL EXAMINATION:   GENERAL:  82 y.o.-year-old patient with no acute distress. Obese LUNGS: Normal breath sounds bilaterally, mild wheezing CARDIOVASCULAR: S1, S2 normal. No murmur   ABDOMEN: Soft, nontender, abdominal obesity EXTREMITIES: bilateral chronic lymphedema with significant swelling NEUROLOGIC: nonfocal  patient is alert and awake, deconditioned  LABORATORY PANEL:  CBC Recent Labs  Lab 11/27/22 0245  WBC 7.2  HGB 11.0*  HCT 35.0*  PLT 173    Chemistries  Recent Labs  Lab 11/30/22 0924  NA 140  K 3.8  CL 101  CO2 27  GLUCOSE 216*  BUN 71*  CREATININE 3.07*  CALCIUM 8.4*   Assessment and Plan   Kyle Roberson is a 82 y.o. 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 Garnett Farm office with increasing weight gain, bilateral lower extremity swelling and shortness of breath.  Patient's baseline weight is 220 pounds. Weight and nephrology office was 255 pounds. Patient is being admitted with acute on chronic diastolic heart failure.   Acute on chronic diastolic congestive heart failure Pulmonary HTN -- patient came in with volume overload with increasing weight up to 255 pounds. Dry weight around ~220 pounds -- last echo was done in  December 2023 which showed elevated right ventricular pressure and severe pulmonary hypertension -- will repeat echo -- cardiology consultation with [facility] heart failure Dr Larina Bras -- patient received Lasix 60 mg IV once will start patient on Lasix drip 6 mg/hr Dr. Doristine Church recommendation -- monitor input output and creatinine -- diuresing well --pt does NOT wear oxygen at home per wife --9/7--Lasix gtt increased to 8 mg/hr. mild wheezing. Added duonebs. Wife worried about hypoglycemia does not want sliding scale insulin while patient in house. Will change CBG three times a day. --now on Farxiga per Cards rec --9/9-- transferred to ICU for IV milrinone gtt per [facility] heart failure MD.  --Lasix gtt increased to 15 mg /hr --RHC on Wednesday.    chronic kidney disease stage IV Cardiorenal syndrome -- baseline creatinine around 2.5-- 2.7--2.98--3.07 -- creatinine 2.85 -- nephrology consultation with Dr. Judi Cong --IV albumin x1   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 -- sliding scale insulin--d/ced per wifes request -- on farxiga --At home also on glipizide xl 5 mg--holding it   Morbid obesity -- complicates overall prognosis   Hypertension -- reusmed home meds   Hyperlipidemia -- statins   CAD with remote PCI -- continue statins   History of paroxysmal a fib -- rate well-controlled -- patient on eliquis    Advance Care Planning:   Code Status: Full Code discussed with patient and wife in the ER  Consults: cardiology, nephrology  Family Communication: wife at bedside in the ER  DVT Prophylaxis :eliquis Level of care: Stepdown Status is: Inpatient Remains inpatient appropriate because:  CHF on IV lasix gtt, IV milrinone gtt    TOTAL TIME TAKING CARE OF THIS PATIENT: 35 minutes.  >50% time spent on counselling and coordination of care  Note: This dictation was prepared with Dragon dictation along with smaller phrase technology. Any  transcriptional errors that result from this process are unintentional.  Enedina Finner M.D    [facility]   CC: Primary care physician; [name], MD

## 2022-12-01 ENCOUNTER — Ambulatory Visit: Payer: BC Managed Care – PPO | Admitting: Internal Medicine

## 2022-12-01 ENCOUNTER — Other Ambulatory Visit: Payer: Self-pay

## 2022-12-01 DIAGNOSIS — I5033 Acute on chronic diastolic (congestive) heart failure: Secondary | ICD-10-CM | POA: Diagnosis not present

## 2022-12-01 LAB — BASIC METABOLIC PANEL
Anion gap: 11 (ref 5–15)
Anion gap: 14 (ref 5–15)
BUN: 79 mg/dL — ABNORMAL HIGH (ref 8–23)
BUN: 86 mg/dL — ABNORMAL HIGH (ref 8–23)
CO2: 28 mmol/L (ref 22–32)
CO2: 27 mmol/L (ref 22–32)
Calcium: 8.6 mg/dL — ABNORMAL LOW (ref 8.9–10.3)
Calcium: 8.7 mg/dL — ABNORMAL LOW (ref 8.9–10.3)
Chloride: 102 mmol/L (ref 98–111)
Chloride: 96 mmol/L — ABNORMAL LOW (ref 98–111)
Creatinine, Ser: 3.2 mg/dL — ABNORMAL HIGH (ref 0.61–1.24)
Creatinine, Ser: 3.42 mg/dL — ABNORMAL HIGH (ref 0.61–1.24)
GFR, Estimated: 18 mL/min — ABNORMAL LOW (ref >60)
GFR, Estimated: 17 mL/min — ABNORMAL LOW (ref >60)
Glucose, Bld: 225 mg/dL — ABNORMAL HIGH (ref 70–99)
Glucose, Bld: 375 mg/dL — ABNORMAL HIGH (ref 70–99)
Potassium: 3.9 mmol/L (ref 3.5–5.1)
Potassium: 4 mmol/L (ref 3.5–5.1)
Sodium: 141 mmol/L (ref 135–145)
Sodium: 137 mmol/L (ref 135–145)

## 2022-12-01 LAB — MAGNESIUM: Magnesium: 2.6 mg/dL — ABNORMAL HIGH (ref 1.7–2.4)

## 2022-12-01 MED ORDER — MILRINONE LACTATE IN DEXTROSE 20-5 MG/100ML-% IV SOLN
0.1250 ug/kg/min | INTRAVENOUS | Status: DC
  Administered 2022-12-01 – 2022-12-03 (×6): 0.25 ug/kg/min via INTRAVENOUS
  Administered 2022-12-04: 0.125 ug/kg/min via INTRAVENOUS
  Filled 2022-12-01 (×8): qty 100

## 2022-12-01 MED ORDER — METOLAZONE 2.5 MG PO TABS
2.5000 mg | ORAL_TABLET | Freq: Once | ORAL | Status: DC
  Filled 2022-12-01: qty 1

## 2022-12-01 MED ORDER — AMIODARONE HCL IN DEXTROSE 360-4.14 MG/200ML-% IV SOLN
60.0000 mg/h | INTRAVENOUS | Status: AC
  Administered 2022-12-01 – 2022-12-02 (×2): 60 mg/h via INTRAVENOUS
  Filled 2022-12-01 (×2): qty 200

## 2022-12-01 MED ORDER — METOLAZONE 5 MG PO TABS
5.0000 mg | ORAL_TABLET | Freq: Once | ORAL | Status: AC
  Administered 2022-12-01: 5 mg via ORAL
  Filled 2022-12-01: qty 1

## 2022-12-01 MED ORDER — AMIODARONE LOAD VIA INFUSION
150.0000 mg | Freq: Once | INTRAVENOUS | Status: AC
  Administered 2022-12-01: 150 mg via INTRAVENOUS
  Filled 2022-12-01: qty 83.34

## 2022-12-01 MED ORDER — AMIODARONE HCL IN DEXTROSE 360-4.14 MG/200ML-% IV SOLN
30.0000 mg/h | INTRAVENOUS | Status: DC
  Administered 2022-12-02 – 2022-12-05 (×7): 30 mg/h via INTRAVENOUS
  Filled 2022-12-01 (×6): qty 200

## 2022-12-01 MED ORDER — POTASSIUM CHLORIDE CRYS ER 20 MEQ PO TBCR
40.0000 meq | EXTENDED_RELEASE_TABLET | Freq: Once | ORAL | Status: AC
  Administered 2022-12-01: 40 meq via ORAL
  Filled 2022-12-01: qty 2

## 2022-12-01 NOTE — Progress Notes (Addendum)
Patient ID: Kyle Roberson, male   DOB: February 18, 1941, 82 y.o.   MRN: [id number]     Advanced Heart Failure Rounding Note  PCP-Cardiologist: Julien Nordmann, MD   Subjective:    Remains on Lasix gtt 15 mg/hr. Got 2.5 metolazone x1 yesterday. Milrinone started yesterday (9/10). -2.5 L UOP, net (-1.3L) weight up 1lb.   Feels better this morning. Resp rate WNL today and feels his breathing is much better.   Objective:   Weight Range: 112.3 kg Body mass index is 37.64 kg/m.   Vital Signs:   Temp:  [97.3 F (36.3 C)-98.2 F (36.8 C)] 97.5 F (36.4 C) (09/10 0400) Pulse Rate:  [51-131] 61 (09/10 0600) Resp:  [18-28] 18 (09/10 0600) BP: (125-171)/(45-78) 127/49 (09/10 0600) SpO2:  [90 %-100 %] 94 % (09/10 0600) Weight:  [112 kg-112.3 kg] 112.3 kg (09/10 0500) Last BM Date : 11/30/22  Weight change: Filed Weights   11/28/22 0500 11/30/22 0904 12/01/22 0500  Weight: 111.5 kg 112 kg 112.3 kg   Intake/Output:   Intake/Output Summary (Last 24 hours) at 12/01/2022 0630 Last data filed at 12/01/2022 0600 Gross per 24 hour  Intake 1207.07 ml  Output 2550 ml  Net -1342.93 ml    Physical Exam  General:  elderly appearing.  No respiratory difficulty HEENT: normal Neck: supple. JVD ~12 cm. Carotids 2+ bilat; no bruits. No lymphadenopathy or thyromegaly appreciated. Cor: PMI nondisplaced. Regular rate & rhythm. No rubs, gallops or murmurs. Lungs: coarse bases Abdomen: soft, nontender, nondistended. No hepatosplenomegaly. No bruits or masses. Good bowel sounds. Extremities: no cyanosis, clubbing, rash, +1-2 BLE edema to knees Neuro: drowsy, cranial nerves grossly intact. moves all 4 extremities w/o difficulty. Affect pleasant.   Telemetry   A fib 60s (personally reviewed)  Labs    CBC No results for input(s): "WBC", "NEUTROABS", "HGB", "HCT", "MCV", "PLT" in the last 72 hours. Basic Metabolic Panel Recent Labs    16/01/09 0924 12/01/22 0414  NA 140 141  K 3.8 3.9  CL 101  102  CO2 27 28  GLUCOSE 216* 225*  BUN 71* 79*  CREATININE 3.07* 3.20*  CALCIUM 8.4* 8.6*   Liver Function Tests Recent Labs    11/30/22 0923  ALBUMIN 3.2*   No results for input(s): "LIPASE", "AMYLASE" in the last 72 hours. Cardiac Enzymes No results for input(s): "CKTOTAL", "CKMB", "CKMBINDEX", "TROPONINI" in the last 72 hours.  BNP: BNP (last 3 results) Recent Labs    06/22/22 1613 08/24/22 1559 11/26/22 1555  BNP 171.0* 170.1* 238.6*    ProBNP (last 3 results) No results for input(s): "PROBNP" in the last 8760 hours.   D-Dimer No results for input(s): "DDIMER" in the last 72 hours. Hemoglobin A1C No results for input(s): "HGBA1C" in the last 72 hours. Fasting Lipid Panel No results for input(s): "CHOL", "HDL", "LDLCALC", "TRIG", "CHOLHDL", "LDLDIRECT" in the last 72 hours. Thyroid Function Tests No results for input(s): "TSH", "T4TOTAL", "T3FREE", "THYROIDAB" in the last 72 hours.  Invalid input(s): "FREET3"  Other results:   Imaging   No results found.  Medications:   Scheduled Medications:  apixaban  2.5 mg Oral BID   Chlorhexidine Gluconate Cloth  6 each Topical Daily   cholecalciferol  2,000 Units Oral Daily   docusate sodium  100 mg Oral BID   doxazosin  1 mg Oral BID   empagliflozin  10 mg Oral Daily   hydrALAZINE  100 mg Oral TID   isosorbide mononitrate  60 mg Oral Daily   multivitamin with  minerals  1 tablet Oral Daily   neomycin-polymyxin b-dexamethasone  1 Application Right Eye TID   omega-3 acid ethyl esters  1 g Oral Daily   rosuvastatin  10 mg Oral Once per day on Monday Wednesday Friday   sodium chloride flush  3 mL Intravenous Q12H    Infusions:  sodium chloride     furosemide (LASIX) 200 mg in dextrose 5 % 100 mL (2 mg/mL) infusion 15 mg/hr (12/01/22 0500)   milrinone 0.125 mcg/kg/min (12/01/22 0500)    PRN Medications: sodium chloride, acetaminophen **OR** acetaminophen, calamine, ipratropium-albuterol, ondansetron **OR**  ondansetron (ZOFRAN) IV, polyethylene glycol, sodium chloride flush, traZODone  Assessment/Plan  1. Acute on chronic diastolic CHF: With prominent RV failure.  Echo this admission with EF 55-60%, mild LVH, moderate RV enlargement with mildly decreased RV systolic function, PASP 73 mmHg, severe LAE, mild-moderate TR, IVC dilated.  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 Weight up 1lb.  Creatinine mildly higher 2.98 => 3.07=>3.2.  Remains volume overloaded. Milrinone 0.125 started yesterday for RV support, no PICC placed with CKD stage IV..  - Continue Lasix to 15 mg/hr and will give metolazone 2.5 x 1 again today.  Will give KCl.  - Continue dapagliflozin for now.  - Increase milrinone 0.125>0.25 to augment diuresis prior to RHC.  - RHC set for tomorrow 2. CKD stage IV: Complicates CHF.  Creatinine 2.98 => 3.07=>3.2.   - Increasing diuresis given marked volume overload as above, may help renal function by lowering renal venous pressure.  - Milrinone for RV support, increase dose today. RHC as noted above.  3.  Atrial fibrillation: Permanent. Reasonable rate control.  - He is on apixaban 2.5 bid.  4. CAD: Remote PCI to LAD and diagonal.  No chest pain.  - Continue statin - No ASA with apixaban use.  5. 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   - RHC Wednesday. The patient understands that risks included but are not limited to stroke (1 in 1000), death (1 in 1000),  bleeding (1 in 200).  The patient understands and agrees to proceed.   Length of Stay: 5  Alen Bleacher, NP  12/01/2022, 7:12 AM  Advanced Heart Failure Team Pager [phone] (M-F; 7a - 5p)  Please contact [facility] for night-coverage after hours (5p -7a ) and weekends on [email or web address]  Patient seen with NP, agree with the above note.   Some diuresis yesterday, I/Os net negative 1343.  He is now on milrinone 0.125 and Lasix gtt at 15 mg/hr, had metolazone 2.5 yesterday.  Creatinine up 3.07 => 3.2.   He looks more comfortable/less tachypneic today.   General: NAD Neck: JVP 14-16 cm, no thyromegaly or thyroid nodule.  Lungs: Clear to auscultation bilaterally with normal respiratory effort. CV: Nondisplaced PMI.  Heart regular S1/S2, no S3/S4, no murmur.  2+ chronic edema to knees.   Abdomen: Soft, nontender, no hepatosplenomegaly, mild distention.  Skin: Intact without lesions or rashes.  Neurologic: Alert and oriented x 3.  Psych: Normal affect. Extremities: No clubbing or cyanosis.  HEENT: Normal.   Still quite volume overloaded on exam, suspect primarily RV failure.  Started on milrinone 0.125 yesterday for RV support, creatinine still mildly higher at 3.2.   - Would increase milrinone to 0.25 as he has tolerated well so far.  - Continue Lasix 15 mg/hr and will give metolazone 5 mg po x 1.  - He says that he would not want HD; I worry  that with intractable volume overload and progressive cardiorenal syndrome, his creatinine could continue to get worse.  - He needs a RHC to understand his hemodynamics. He is reluctant.  He is going to talk more with his wife about this today.  He has a slot for cath at 8:30 tomorrow morning.  I discussed risks/benefits with him today.   He is in chronic atrial fibrillation, rate is controlled and he is on Eliquis.  Hold am dose for cath tomorrow (assuming he agrees).   Marca Ancona 12/01/2022 8:31 AM

## 2022-12-01 NOTE — Plan of Care (Signed)
  Problem: Health Behavior/Discharge Planning: Goal: Ability to manage health-related needs will improve Outcome: Not Progressing   Problem: Metabolic: Goal: Ability to maintain appropriate glucose levels will improve Outcome: Not Progressing   Problem: Education: Goal: Knowledge of General Education information will improve Description: Including pain rating scale, medication(s)/side effects and non-pharmacologic comfort measures Outcome: Progressing

## 2022-12-01 NOTE — Progress Notes (Signed)
[facility]  - [employer] at [facility]   PATIENT NAME: Kyle Roberson    MR#:  [id number]  DATE OF BIRTH:  1940/09/07  SUBJECTIVE:  Wife at bedside. Patient moved to ICU yesterday. patient appears fatigued. Urine output 2550 mL yesterday. Seen by [facility] MG heart failure cardiologist and recommends IV milrinone drip and right heart catheterization. Appears sleepy. Per wife he is still not made his decision regarding RHC   VITALS:  Blood pressure (!) 143/64, pulse 82, temperature (!) 97.3 F (36.3 C), temperature source Oral, resp. rate 20, height 5\' 8"  (1.727 m), weight 112.3 kg, SpO2 97%.  PHYSICAL EXAMINATION:   GENERAL:  82 y.o.-year-old patient with no acute distress. Obese LUNGS: Normal breath sounds bilaterally, mild wheezing CARDIOVASCULAR: S1, S2 normal. No murmur   ABDOMEN: Soft, nontender, abdominal obesity EXTREMITIES: bilateral chronic lymphedema with significant swelling NEUROLOGIC: nonfocal  patient is alert and awake, deconditioned  LABORATORY PANEL:  CBC Recent Labs  Lab 11/27/22 0245  WBC 7.2  HGB 11.0*  HCT 35.0*  PLT 173    Chemistries  Recent Labs  Lab 12/01/22 0414  NA 141  K 3.9  CL 102  CO2 28  GLUCOSE 225*  BUN 79*  CREATININE 3.20*  CALCIUM 8.6*   Assessment and Plan   Kyle Roberson is a 82 y.o. 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 Garnett Farm office with increasing weight gain, bilateral lower extremity swelling and shortness of breath.  Patient's baseline weight is 220 pounds. Weight and nephrology office was 255 pounds. Patient is being admitted with acute on chronic diastolic heart failure.   Acute on chronic diastolic congestive heart failure Pulmonary HTN -- patient came in with volume  overload with increasing weight up to 255 pounds. Dry weight around ~220 pounds -- last echo was done in December 2023 which showed elevated right ventricular pressure and severe pulmonary hypertension -- will repeat echo -- cardiology consultation with [facility] heart failure Dr Larina Bras -- patient received Lasix 60 mg IV once will start patient on Lasix drip 6 mg/hr Dr. Doristine Church recommendation -- monitor input output and creatinine -- diuresing well --pt does NOT wear oxygen at home per wife --9/7--Lasix gtt increased to 8 mg/hr. mild wheezing. Added duonebs. Wife worried about hypoglycemia does not want sliding scale insulin while patient in house. Will change CBG three times a day. --now on Farxiga per Cards rec --9/9-- transferred to ICU for IV milrinone gtt per [facility] heart failure MD.  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 Judi Cong --IV albumin per Nephrology   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 -- sliding scale insulin--d/ced per wifes request -- on farxiga --At home also on glipizide xl 5 mg--holding it   Morbid obesity -- complicates overall prognosis   Hypertension -- reusmed home meds   Hyperlipidemia -- statins   CAD with remote PCI -- continue statins   History of paroxysmal a fib -- rate well-controlled -- patient on eliquis    Advance Care Planning:   Code Status: Full  Code discussed with patient and wife in the ER  Consults: cardiology, nephrology  Family Communication: wife at bedside in the ER  DVT Prophylaxis :eliquis Level of care: Stepdown Status is: Inpatient Remains inpatient appropriate because: CHF on IV lasix  gtt, IV milrinone gtt    TOTAL TIME TAKING CARE OF THIS PATIENT: 35 minutes.  >50% time spent on counselling and coordination of care  Note: This dictation was prepared with Dragon dictation along with smaller phrase technology. Any transcriptional errors that result from this process are unintentional.  Enedina Finner M.D    [facility]   CC: Primary care physician; [name], MD

## 2022-12-01 NOTE — Progress Notes (Signed)
Went to bedside to again discuss RHC. Discussed procedure in detail with patient and wife. After long discussion, patient agreeable, gave verbal to have his wife sign for him as he's "too weak to write".   The patient understands that risks included but are not limited to stroke (1 in 1000), death (1 in 1000), kidney failure [usually temporary] (1 in 500), bleeding (1 in 200), allergic reaction [possibly serious] (1 in 200).  The patient understands and agrees to proceed.   Written consent signed and given to RN. RN to place R [facility] IV for use tomorrow. Discussed plan with RN. NPO after midnight.   Brynda Peon, AGACNP-BC  Advanced Heart Failure Team  12/01/22 12:22 PM

## 2022-12-01 NOTE — Progress Notes (Addendum)
Physical Therapy Treatment Patient Details Name: Kyle Roberson MRN: [id number] DOB: 05/11/1940 Today's Date: 12/01/2022   History of Present Illness Kyle Roberson is an 81yoM who comes to [facility] from OP nephrology on 9/5 due to LEE and ABD swelling. PMH: CKD, CHF. As of 9/9 he was transferred to ICU for IV milrinone gtt per [facility] heart failure MD.    PT Comments  Pt presents laying in bed with wife in room, some pain in L ankle. PT and wife provided maximal encouragement for EOB activities, but Pt was only willing to perform supine TherEx (see below). He exhibited difficulties in LLE antigravity movements due to increased fluid, requiring minA from PT for repositioning. Pt required continued multimodal encouragement/facilitation for therEx with LLE, but unclear if its due to difficulties in sequencing, initiation, or strength/mobility. PT/wife re attempted to motivate pt to perform EOB activities, but pt mentioned he had done enough with OT earlier and refused. He's overall limited by L ankle pain in weight bearing activities and exhibits self limiting behaviors.   PT educated pt on importance of participation in therapy and regular mobility (wife voiced agreement). He would benefit from continued skilled therapy to maximize functional abilities.    If plan is discharge home, recommend the following: A lot of help with walking and/or transfers;A lot of help with bathing/dressing/bathroom;Assist for transportation;Help with stairs or ramp for entrance;Assistance with cooking/housework   Can travel by private vehicle        Equipment Recommendations  None recommended by PT    Recommendations for Other Services       Precautions / Restrictions Precautions Precautions: Fall Restrictions Weight Bearing Restrictions: No     Mobility  Bed Mobility               General bed mobility comments: Not observed during session, refused EOB/ repositioning activities. Maximal encouragement  from PT/wife given. MinA for LLE repositioning.    Transfers                        Ambulation/Gait                   Stairs             Wheelchair Mobility     Tilt Bed    Modified Rankin (Stroke Patients Only)       Balance                                            Cognition Arousal: Alert Behavior During Therapy: WFL for tasks assessed/performed Overall Cognitive Status: Within Functional Limits for tasks assessed                                          Exercises General Exercises - Lower Extremity Ankle Circles/Pumps: AROM, Both, 15 reps, Supine Quad Sets: AROM, Right, 15 reps, Left, 10 reps, Supine Heel Slides: AROM, Both, 10 reps, Supine    General Comments        Pertinent Vitals/Pain Pain Assessment Pain Assessment: Faces Faces Pain Scale: Hurts a little bit Pain Location: L ankle Pain Descriptors / Indicators: Discomfort, Grimacing Pain Intervention(s): Monitored during session, Repositioned    Home Living  Prior Function            PT Goals (current goals can now be found in the care plan section) Acute Rehab PT Goals Patient Stated Goal: remain supervision level for moblity, avoid falling Progress towards PT goals: Progressing toward goals    Frequency    Min 1X/week      PT Plan      Co-evaluation              AM-PAC PT "6 Clicks" Mobility   Outcome Measure  Help needed turning from your back to your side while in a flat bed without using bedrails?: A Lot Help needed moving from lying on your back to sitting on the side of a flat bed without using bedrails?: A Lot Help needed moving to and from a bed to a chair (including a wheelchair)?: Total Help needed standing up from a chair using your arms (e.g., wheelchair or bedside chair)?: A Lot Help needed to walk in hospital room?: Total Help needed climbing 3-5 steps with a  railing? : Total 6 Click Score: 9    End of Session Equipment Utilized During Treatment: Gait belt Activity Tolerance: Patient tolerated treatment well;No increased pain Patient left: in bed;with family/visitor present;with call bell/phone within reach   PT Visit Diagnosis: Unsteadiness on feet (R26.81);Muscle weakness (generalized) (M62.81)     Time: 1610-9604 PT Time Calculation (min) (ACUTE ONLY): 17 min  Charges:    $Therapeutic Exercise: 8-22 mins PT General Charges $$ ACUTE PT VISIT: 1 Visit                    Arely Tinner, PT, SPT 11:10 AM,12/01/22

## 2022-12-01 NOTE — Progress Notes (Signed)
Occupational Therapy Treatment Patient Details Name: Kyle Roberson MRN: [id number] DOB: 10-09-1940 Today's Date: 12/01/2022   History of present illness Kyle Roberson is an 81yoM who comes to [facility] from OP nephrology on 9/5 due to LEE and ABD swelling. PMH: CKD, CHF.   OT comments  Mr [name] was seen for OT treatment on this date. Upon arrival to room pt reclined in bed, agreeable to tx. Pt requires heavy assist for bed mobility. MOD A + RW sit<>stand x2 at EOB and MIN A side steps along EOB. Limited by L ankle pain, fair standing tolerance with mild posterior lean. Pt making good progress toward goals, will continue to follow POC. Discharge recommendation remains appropriate.        If plan is discharge home, recommend the following:  A lot of help with walking and/or transfers;A lot of help with bathing/dressing/bathroom;Assistance with cooking/housework;Assist for transportation;Help with stairs or ramp for entrance   Equipment Recommendations  BSC/3in1    Recommendations for Other Services      Precautions / Restrictions Precautions Precautions: Fall Restrictions Weight Bearing Restrictions: No       Mobility Bed Mobility Overal bed mobility: Needs Assistance Bed Mobility: Supine to Sit, Sit to Supine     Supine to sit: Mod assist Sit to supine: Max assist   General bed mobility comments: sleeps in lift chair at baseline    Transfers Overall transfer level: Needs assistance Equipment used: Rolling walker (2 wheels) Transfers: Sit to/from Stand Sit to Stand: Mod assist, From elevated surface                 Balance Overall balance assessment: Needs assistance Sitting-balance support: Bilateral upper extremity supported, Feet supported Sitting balance-Leahy Scale: Fair     Standing balance support: Reliant on assistive device for balance, Bilateral upper extremity supported Standing balance-Leahy Scale: Poor                              ADL either performed or assessed with clinical judgement   ADL Overall ADL's : Needs assistance/impaired                                       General ADL Comments: MAX A don B socks bed level. MIN A + RW simulated BSC t/f      Cognition Arousal: Alert Behavior During Therapy: WFL for tasks assessed/performed Overall Cognitive Status: Within Functional Limits for tasks assessed                                                     Pertinent Vitals/ Pain       Pain Assessment Pain Assessment: Faces Faces Pain Scale: Hurts little more Pain Location: L ankle Pain Descriptors / Indicators: Aching, Moaning Pain Intervention(s): Limited activity within patient's tolerance, Repositioned   Frequency  Min 1X/week        Progress Toward Goals  OT Goals(current goals can now be found in the care plan section)  Progress towards OT goals: Progressing toward goals  Acute Rehab OT Goals Patient Stated Goal: go home OT Goal Formulation: With patient/family Time For Goal Achievement: 12/12/22 Potential to Achieve Goals: Good ADL Goals Pt Will Perform Lower Body  Dressing: with caregiver independent in assisting;sit to/from stand Pt Will Transfer to Toilet: ambulating;bedside commode;with supervision Pt Will Perform Toileting - Clothing Manipulation and hygiene: with caregiver independent in assisting Additional ADL Goal #1: Pt will verbalize plan to implement at least 2 learned ECS/falls prevention strategies to maximize safety/indep with ADL and mobility.  Plan      Co-evaluation                 AM-PAC OT "6 Clicks" Daily Activity     Outcome Measure   Help from another person eating meals?: None Help from another person taking care of personal grooming?: A Little Help from another person toileting, which includes using toliet, bedpan, or urinal?: A Lot Help from another person bathing (including washing, rinsing, drying)?: A  Lot Help from another person to put on and taking off regular upper body clothing?: A Little Help from another person to put on and taking off regular lower body clothing?: A Lot 6 Click Score: 16    End of Session Equipment Utilized During Treatment: Rolling walker (2 wheels);Oxygen  OT Visit Diagnosis: Other abnormalities of gait and mobility (R26.89);Muscle weakness (generalized) (M62.81);Pain Pain - Right/Left: Left Pain - part of body: Ankle and joints of foot   Activity Tolerance Patient tolerated treatment well   Patient Left in bed;with call bell/phone within reach;with family/visitor present   Nurse Communication          Time: 4401-0272 OT Time Calculation (min): 29 min  Charges: OT General Charges $OT Visit: 1 Visit OT Treatments $Self Care/Home Management : 8-22 mins $Therapeutic Activity: 8-22 mins  Kathie Dike, M.S. OTR/L  12/01/22, 9:49 AM  ascom [phone]

## 2022-12-01 NOTE — Plan of Care (Signed)
  Problem: Education: Goal: Ability to describe self-care measures that may prevent or decrease complications (Diabetes Survival Skills Education) will improve Outcome: Progressing   Problem: Coping: Goal: Ability to adjust to condition or change in health will improve Outcome: Progressing   Problem: Health Behavior/Discharge Planning: Goal: Ability to identify and utilize available resources and services will improve Outcome: Progressing   Problem: Metabolic: Goal: Ability to maintain appropriate glucose levels will improve Outcome: Progressing   Problem: Nutritional: Goal: Progress toward achieving an optimal weight will improve Outcome: Progressing   Problem: Skin Integrity: Goal: Risk for impaired skin integrity will decrease Outcome: Progressing

## 2022-12-01 NOTE — Progress Notes (Signed)
[facility] [place], [place] 12/01/22  Subjective:   Hospital day # 5  Patient known to our practice from outpatient follow-up.  He was sent over to the emergency room for admission due to severe volume overload.  Previous weight in the office in April 2024 was 237 pounds. At his last office visit on 11/25/2022, weight had increased to 255 pounds. Patient was referred to the emergency room for IV diuresis.  Creatinine up to 3.2 today. Urine outpt 2.5 liters over the preceding 24 hours.  Continues on milrinone drip.  Renal: 09/09 0701 - 09/10 0700 In: 1207.1 [P.O.:990; I.V.:217.1] Out: 2550 [Urine:2550] Lab Results  Component Value Date   CREATININE 3.20 (H) 12/01/2022   CREATININE 3.07 (H) 11/30/2022   CREATININE 2.98 (H) 11/29/2022     Objective:  Vital signs in last 24 hours:  Temp:  [97.3 F (36.3 C)-98.6 F (37 C)] 98.6 F (37 C) (09/10 0800) Pulse Rate:  [51-131] 73 (09/10 0900) Resp:  [18-28] 27 (09/10 0900) BP: (125-171)/(45-78) 143/64 (09/10 0900) SpO2:  [90 %-100 %] 90 % (09/10 0900) Weight:  [112.3 kg] 112.3 kg (09/10 0500)  Weight change:  Filed Weights   11/28/22 0500 11/30/22 0904 12/01/22 0500  Weight: 111.5 kg 112 kg 112.3 kg    Intake/Output:    Intake/Output Summary (Last 24 hours) at 12/01/2022 0957 Last data filed at 12/01/2022 0943 Gross per 24 hour  Intake 1007.3 ml  Output 2400 ml  Net -1392.7 ml     Physical Exam: General: Elderly gentleman, laying in the bed  HEENT Moist oral mucous membranes, lower eyelid conjunctival erythema.  Pulm/lungs Normal breathing effort, Wheezing  CVS/Heart Irregular, bradycardic  Abdomen:  Soft, nontender  Extremities: Significant edema to lower abdomen bilaterally  Neurologic: Alert, oriented  Skin: Skin changes from chronic edema.          Basic Metabolic Panel:  Recent Labs  Lab 11/26/22 1555 11/27/22 0245 11/29/22 0455 11/30/22 0924 12/01/22 0414  NA 140  --  140 140  141  K 3.9  --  3.5 3.8 3.9  CL 105  --  101 101 102  CO2 25  --  26 27 28   GLUCOSE 124*  --  146* 216* 225*  BUN 59*  --  63* 71* 79*  CREATININE 2.85* 2.94* 2.98* 3.07* 3.20*  CALCIUM 8.8*  --  8.6* 8.4* 8.6*     CBC: Recent Labs  Lab 11/26/22 1555 11/27/22 0245  WBC 7.4 7.2  NEUTROABS 5.6  --   HGB 10.9* 11.0*  HCT 34.0* 35.0*  MCV 94.4 95.9  PLT 167 173     No results found for: "HEPBSAG", "HEPBSAB", "HEPBIGM"    Microbiology:  Recent Results (from the past 240 hour(s))  MRSA Next Gen by PCR, Nasal     Status: None   Collection Time: 11/30/22  4:10 PM   Specimen: Nasal Mucosa; Nasal Swab  Result Value Ref Range Status   MRSA by PCR Next Gen NOT DETECTED NOT DETECTED Final    Comment: (NOTE) The GeneXpert MRSA Assay (FDA approved for NASAL specimens only), is one component of a comprehensive MRSA colonization surveillance program. It is not intended to diagnose MRSA infection nor to guide or monitor treatment for MRSA infections. Test performance is not FDA approved in patients less than 69 years old. Performed at [facility], [address] [place], [place] [address]     Coagulation Studies: No results for input(s): "LABPROT", "INR" in the last  72 hours.  Urinalysis: No results for input(s): "COLORURINE", "LABSPEC", "PHURINE", "GLUCOSEU", "HGBUR", "BILIRUBINUR", "KETONESUR", "PROTEINUR", "UROBILINOGEN", "NITRITE", "LEUKOCYTESUR" in the last 72 hours.  Invalid input(s): "APPERANCEUR"    Imaging: No results found.   Medications:    sodium chloride     furosemide (LASIX) 200 mg in dextrose 5 % 100 mL (2 mg/mL) infusion 15 mg/hr (12/01/22 0943)   milrinone 0.2511 mcg/kg/min (12/01/22 0943)    apixaban  2.5 mg Oral BID   Chlorhexidine Gluconate Cloth  6 each Topical Daily   cholecalciferol  2,000 Units Oral Daily   docusate sodium  100 mg Oral BID   doxazosin  1 mg Oral BID   empagliflozin  10 mg Oral Daily   hydrALAZINE  100 mg Oral  TID   isosorbide mononitrate  60 mg Oral Daily   multivitamin with minerals  1 tablet Oral Daily   neomycin-polymyxin b-dexamethasone  1 Application Right Eye TID   omega-3 acid ethyl esters  1 g Oral Daily   rosuvastatin  10 mg Oral Once per day on Monday Wednesday Friday   sodium chloride flush  3 mL Intravenous Q12H   sodium chloride, acetaminophen **OR** acetaminophen, calamine, ipratropium-albuterol, ondansetron **OR** ondansetron (ZOFRAN) IV, polyethylene glycol, sodium chloride flush, traZODone  Assessment/ Plan:  82 y.o. male with type 2 diabetes, coronary disease, chronic kidney disease, chronic diastolic CHF, bilateral lower extremity lymphedema, permanent atrial fibrillation, pulmonary hypertension, history of subdural hematoma  admitted on 11/26/2022 for Acute on chronic diastolic CHF (congestive heart failure) (HCC) [I50.33] Hypervolemia, unspecified hypervolemia type [E87.70]  Volume overload, lower extremity edema in the setting of acute on chronic diastolic CHF exacerbation. -Renal function slightly worse (3.2 with good urine output of 2.5 L over the patient 24 hours.  Continue Lasix drip at 15 mg/h for now under the instruction of cardiology.  Farxiga switched to [place] given lower EGFR.  Right heart cath being planned tomorrow.  Diabetes mellitus type 2 with chronic kidney disease/chronic kidney disease stage IV Creatinine increased to 3.2 with good urine output noted as above.  Continue Lasix drip as well as Jardiance.    LOS: 5 Kyle Roberson 9/10/20249:57 AM  [address] [place], [place] [phone]

## 2022-12-01 NOTE — TOC Progression Note (Signed)
Transition of Care [facility]) - Progression Note    Patient Details  Name: Kyle Roberson MRN: [id number] Date of Birth: Oct 11, 1940  Transition of [facility] CM/SW Contact  Chapman Fitch, RN Phone Number: 12/01/2022, 4:01 PM  Clinical Narrative:      Plan for RHC tomorrow  Therapy recommending SNF.  Unable to reach patient by phone at this time to discuss.  Spoke with wife via phone.  She states that she discussed with the patient this morning about the need for rehab. She states patient is hopeful that he improves and is able to go home at discharge  First Preference of SNF is [facility].  I spoke with their admissions coordinator and they are not in network with patients insurance  Existing PASRR Fl2 sent for signature Bed search iniatied     Expected Discharge Plan: Home/Self Care Barriers to Discharge: No Barriers Identified  Expected Discharge Plan and Services       Living arrangements for the past 2 months: Single Family Home                                       Social Determinants of Health (SDOH) Interventions SDOH Screenings   Food Insecurity: No Food Insecurity (12/01/2022)  Housing: Low Risk  (12/01/2022)  Transportation Needs: No Transportation Needs (12/01/2022)  Utilities: Not At Risk (12/01/2022)  Depression (PHQ2-9): Low Risk  (07/27/2022)  Tobacco Use: Medium Risk (11/26/2022)    Readmission Risk Interventions     No data to display

## 2022-12-01 NOTE — NC FL2 (Signed)
[place] MEDICAID FL2 LEVEL OF CARE FORM     IDENTIFICATION  Patient Name: Kyle Roberson Birthdate: [birth year] Sex: male Admission Date (Current Location): 11/26/2022  [facility] and [place] Number:  Chiropodist and Address:         Provider Number: [id number]  Attending Physician Name and Address:  Enedina Finner, MD  Relative Name and Phone Number:       Current Level of Care: Hospital Recommended Level of Care: Skilled Nursing Facility Prior Approval Number:    Date Approved/Denied:   PASRR Number: [id number]  Discharge Plan: SNF    Current Diagnoses: Patient Active Problem List   Diagnosis Date Noted   Acute on chronic diastolic CHF (congestive heart failure) (HCC) 11/26/2022   Hypervolemia 11/26/2022   Pulmonary hypertension, unspecified (HCC) 02/19/2022   Chronic renal failure (CRF), stage 4 (severe) (HCC) 02/19/2022   Permanent atrial fibrillation (HCC) 02/11/2022   Paroxysmal atrial fibrillation (HCC) 02/09/2022   Dyslipidemia 02/09/2022   Depression 02/09/2022   Elevated troponin 02/09/2022   Thyroid nodule 06/04/2021   Diabetic peripheral neuropathy (HCC)    History of subdural hemorrhage 02/04/2021   Hyperlipidemia associated with type 2 diabetes mellitus (HCC)    Acute on chronic right-sided heart failure (HCC) 05/07/2019   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 04/28/2019   CKD (chronic kidney disease) stage 4, GFR 15-29 ml/min (HCC) 04/27/2019   Acute on chronic heart failure with preserved ejection fraction (HFpEF) (HCC) 04/27/2019   Obesity, Class III, BMI 40-49.9 (morbid obesity) (HCC) 04/27/2019   Lymphedema 03/23/2019   Erectile dysfunction 08/01/2018   History of colonic diverticulitis 08/01/2018   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   Venous insufficiency 08/01/2018   Persistent atrial fibrillation (HCC) 08/01/2018   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   Essential hypertension 07/06/2018   Type 2  diabetes mellitus with stage 4 chronic kidney disease, without long-term current use of insulin (HCC) 07/06/2018   Coronary artery disease involving native coronary artery of native heart without angina pectoris 06/05/2018   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/30/2017   Vitamin D deficiency 01/30/2017   Benign non-nodular prostatic hyperplasia with lower urinary tract symptoms 09/29/2014   Nephrolithiasis 01/04/2012    Orientation RESPIRATION BLADDER Height & Weight     Self, Time, Situation, Place  O2 (2L ) Incontinent Weight: 112.3 kg Height:  5\' 8"  (172.7 cm)  BEHAVIORAL SYMPTOMS/MOOD NEUROLOGICAL BOWEL NUTRITION STATUS      Continent Diet (2g sodium diet)  AMBULATORY STATUS COMMUNICATION OF NEEDS Skin   Extensive Assist Verbally Bruising                       Personal Care Assistance Level of Assistance              Functional Limitations Info             SPECIAL CARE FACTORS FREQUENCY  PT (By licensed PT), OT (By licensed OT)                    Contractures Contractures Info: Not present    Additional Factors Info  Code Status, Allergies Code Status Info: full Allergies Info: Atorvastatin, Simvastatin           Current Medications (12/01/2022):  This is the current hospital active medication list Current Facility-Administered Medications  Medication Dose Route Frequency Provider Last Rate Last Admin   0.9 %  sodium chloride infusion  250 mL Intravenous PRN Laurey Morale,  MD       acetaminophen (TYLENOL) tablet 650 mg  650 mg Oral Q6H PRN Laurey Morale, MD   650 mg at 11/28/22 0827   Or   acetaminophen (TYLENOL) suppository 650 mg  650 mg Rectal Q6H PRN Laurey Morale, MD       apixaban Everlene Balls) tablet 2.5 mg  2.5 mg Oral BID Laurey Morale, MD   2.5 mg at 12/01/22 1308   calamine lotion 1 Application  1 Application Topical PRN Laurey Morale, MD       Chlorhexidine Gluconate Cloth 2 % PADS 6 each  6 each Topical Daily Enedina Finner, MD   6 each at 12/01/22 0903   cholecalciferol (VITAMIN D3) 25 MCG (1000 UNIT) tablet 2,000 Units  2,000 Units Oral Daily Laurey Morale, MD   2,000 Units at 12/01/22 0902   docusate sodium (COLACE) capsule 100 mg  100 mg Oral BID Laurey Morale, MD   100 mg at 11/29/22 1125   doxazosin (CARDURA) tablet 1 mg  1 mg Oral BID Laurey Morale, MD   1 mg at 12/01/22 0901   empagliflozin (JARDIANCE) tablet 10 mg  10 mg Oral Daily Laurey Morale, MD   10 mg at 12/01/22 0901   furosemide (LASIX) 200 mg in dextrose 5 % 100 mL (2 mg/mL) infusion  15 mg/hr Intravenous Continuous Laurey Morale, MD 7.5 mL/hr at 12/01/22 1545 15 mg/hr at 12/01/22 1545   hydrALAZINE (APRESOLINE) tablet 100 mg  100 mg Oral TID Laurey Morale, MD   100 mg at 12/01/22 1555   ipratropium-albuterol (DUONEB) 0.5-2.5 (3) MG/3ML nebulizer solution 3 mL  3 mL Nebulization Q6H PRN Laurey Morale, MD   3 mL at 11/28/22 1219   isosorbide mononitrate (IMDUR) 24 hr tablet 60 mg  60 mg Oral Daily Laurey Morale, MD   60 mg at 12/01/22 0902   milrinone (PRIMACOR) 20 MG/100 ML (0.2 mg/mL) infusion  0.25 mcg/kg/min Intravenous Continuous Brynda Peon L, NP 8.4 mL/hr at 12/01/22 1545 0.2511 mcg/kg/min at 12/01/22 1545   multivitamin with minerals tablet 1 tablet  1 tablet Oral Daily Laurey Morale, MD   1 tablet at 12/01/22 0902   neomycin-polymyxin b-dexamethasone (MAXITROL) ophthalmic ointment 1 Application  1 Application Right Eye TID Laurey Morale, MD   1 Application at 12/01/22 6578   omega-3 acid ethyl esters (LOVAZA) capsule 1 g  1 g Oral Daily Laurey Morale, MD   1 g at 12/01/22 0902   ondansetron (ZOFRAN) tablet 4 mg  4 mg Oral Q6H PRN Laurey Morale, MD       Or   ondansetron [facility]) injection 4 mg  4 mg Intravenous Q6H PRN Laurey Morale, MD   4 mg at 12/01/22 1451   polyethylene glycol (MIRALAX / GLYCOLAX) packet 17 g  17 g Oral Daily PRN Laurey Morale, MD       rosuvastatin (CRESTOR) tablet 10 mg  10  mg Oral Once per day on Monday Wednesday Friday Laurey Morale, MD   10 mg at 11/30/22 0941   sodium chloride flush (NS) 0.9 % injection 3 mL  3 mL Intravenous Q12H Laurey Morale, MD   3 mL at 12/01/22 0903   sodium chloride flush (NS) 0.9 % injection 3 mL  3 mL Intravenous PRN Laurey Morale, MD       traZODone (DESYREL) tablet 25 mg  25 mg Oral QHS  PRN Laurey Morale, MD         Discharge Medications: Please see discharge summary for a list of discharge medications.  Relevant Imaging Results:  Relevant Lab Results:   Additional Information SS # [id number]  Chapman Fitch, RN

## 2022-12-02 ENCOUNTER — Inpatient Hospital Stay: Payer: Medicare HMO

## 2022-12-02 ENCOUNTER — Encounter
Admission: EM | Disposition: A | Discharge status: Skilled Nursing Facility | Payer: Self-pay | Source: Home / Self Care | Attending: Internal Medicine

## 2022-12-02 ENCOUNTER — Other Ambulatory Visit (HOSPITAL_COMMUNITY): Payer: Self-pay

## 2022-12-02 DIAGNOSIS — I5033 Acute on chronic diastolic (congestive) heart failure: Secondary | ICD-10-CM

## 2022-12-02 DIAGNOSIS — I493 Ventricular premature depolarization: Secondary | ICD-10-CM

## 2022-12-02 HISTORY — PX: RIGHT HEART CATH: CATH118263

## 2022-12-02 LAB — POCT I-STAT EG7
Acid-Base Excess: 2 mmol/L (ref 0.0–2.0)
Acid-Base Excess: 3 mmol/L — ABNORMAL HIGH (ref 0.0–2.0)
Bicarbonate: 29.6 mmol/L — ABNORMAL HIGH (ref 20.0–28.0)
Bicarbonate: 30.8 mmol/L — ABNORMAL HIGH (ref 20.0–28.0)
Calcium, Ion: 1.14 mmol/L — ABNORMAL LOW (ref 1.15–1.40)
Calcium, Ion: 1.17 mmol/L (ref 1.15–1.40)
HCT: 31 % — ABNORMAL LOW (ref 39.0–52.0)
HCT: 32 % — ABNORMAL LOW (ref 39.0–52.0)
Hemoglobin: 10.5 g/dL — ABNORMAL LOW (ref 13.0–17.0)
Hemoglobin: 10.9 g/dL — ABNORMAL LOW (ref 13.0–17.0)
O2 Saturation: 60 %
O2 Saturation: 61 %
Potassium: 4 mmol/L (ref 3.5–5.1)
Potassium: 4.1 mmol/L (ref 3.5–5.1)
Sodium: 136 mmol/L (ref 135–145)
Sodium: 137 mmol/L (ref 135–145)
TCO2: 31 mmol/L (ref 22–32)
TCO2: 33 mmol/L — ABNORMAL HIGH (ref 22–32)
pCO2, Ven: 62.4 mmHg — ABNORMAL HIGH (ref 44–60)
pCO2, Ven: 63.7 mmHg — ABNORMAL HIGH (ref 44–60)
pH, Ven: 7.284 (ref 7.25–7.43)
pH, Ven: 7.292 (ref 7.25–7.43)
pO2, Ven: 36 mmHg (ref 32–45)
pO2, Ven: 36 mmHg (ref 32–45)

## 2022-12-02 LAB — CBC
HCT: 31.7 % — ABNORMAL LOW (ref 39.0–52.0)
Hemoglobin: 10 g/dL — ABNORMAL LOW (ref 13.0–17.0)
MCH: 30.3 pg (ref 26.0–34.0)
MCHC: 31.5 g/dL (ref 30.0–36.0)
MCV: 96.1 fL (ref 80.0–100.0)
Platelets: 164 10*3/uL (ref 150–400)
RBC: 3.3 MIL/uL — ABNORMAL LOW (ref 4.22–5.81)
RDW: 14.2 % (ref 11.5–15.5)
WBC: 8.6 10*3/uL (ref 4.0–10.5)
nRBC: 0 % (ref 0.0–0.2)

## 2022-12-02 LAB — BASIC METABOLIC PANEL
Anion gap: 14 (ref 5–15)
BUN: 82 mg/dL — ABNORMAL HIGH (ref 8–23)
CO2: 27 mmol/L (ref 22–32)
Calcium: 8.7 mg/dL — ABNORMAL LOW (ref 8.9–10.3)
Chloride: 96 mmol/L — ABNORMAL LOW (ref 98–111)
Creatinine, Ser: 3.33 mg/dL — ABNORMAL HIGH (ref 0.61–1.24)
GFR, Estimated: 18 mL/min — ABNORMAL LOW (ref >60)
Glucose, Bld: 428 mg/dL — ABNORMAL HIGH (ref 70–99)
Potassium: 4.1 mmol/L (ref 3.5–5.1)
Sodium: 137 mmol/L (ref 135–145)

## 2022-12-02 LAB — MAGNESIUM: Magnesium: 2.5 mg/dL — ABNORMAL HIGH (ref 1.7–2.4)

## 2022-12-02 LAB — GLUCOSE, CAPILLARY
Glucose-Capillary: 379 mg/dL — ABNORMAL HIGH (ref 70–99)
Glucose-Capillary: 394 mg/dL — ABNORMAL HIGH (ref 70–99)
Glucose-Capillary: 366 mg/dL — ABNORMAL HIGH (ref 70–99)

## 2022-12-02 SURGERY — RIGHT HEART CATH
Anesthesia: Moderate Sedation

## 2022-12-02 MED ORDER — METOLAZONE 5 MG PO TABS
5.0000 mg | ORAL_TABLET | Freq: Once | ORAL | Status: AC
  Administered 2022-12-02: 5 mg via ORAL
  Filled 2022-12-02: qty 1

## 2022-12-02 MED ORDER — INSULIN ASPART 100 UNIT/ML IJ SOLN
0.0000 [IU] | Freq: Every day | INTRAMUSCULAR | Status: DC
  Administered 2022-12-02: 5 [IU] via SUBCUTANEOUS
  Filled 2022-12-02: qty 1

## 2022-12-02 MED ORDER — LIDOCAINE HCL 1 % IJ SOLN
INTRAMUSCULAR | Status: AC
  Filled 2022-12-02: qty 20

## 2022-12-02 MED ORDER — HYDRALAZINE HCL 50 MG PO TABS
25.0000 mg | ORAL_TABLET | Freq: Three times a day (TID) | ORAL | Status: DC
  Administered 2022-12-02 (×2): 25 mg via ORAL
  Filled 2022-12-02 (×3): qty 1

## 2022-12-02 MED ORDER — HYDRALAZINE HCL 50 MG PO TABS: 50.0000 mg | ORAL_TABLET | Freq: Three times a day (TID) | ORAL | Status: DC

## 2022-12-02 MED ORDER — SODIUM CHLORIDE 0.9 % IV SOLN: INTRAVENOUS | Status: DC

## 2022-12-02 MED ORDER — INSULIN ASPART 100 UNIT/ML IJ SOLN
0.0000 [IU] | Freq: Three times a day (TID) | INTRAMUSCULAR | Status: DC
  Administered 2022-12-02: 9 [IU] via SUBCUTANEOUS
  Administered 2022-12-03 (×3): 5 [IU] via SUBCUTANEOUS
  Filled 2022-12-02 (×4): qty 1

## 2022-12-02 MED ORDER — LIDOCAINE HCL (PF) 1 % IJ SOLN
INTRAMUSCULAR | Status: DC | PRN
  Administered 2022-12-02: 2 mL

## 2022-12-02 MED ORDER — HEPARIN (PORCINE) IN NACL 1000-0.9 UT/500ML-% IV SOLN
INTRAVENOUS | Status: AC
  Filled 2022-12-02: qty 1000

## 2022-12-02 MED ORDER — HEPARIN (PORCINE) IN NACL 1000-0.9 UT/500ML-% IV SOLN
INTRAVENOUS | Status: DC | PRN
  Administered 2022-12-02: 1000 mL

## 2022-12-02 MED ORDER — APIXABAN 2.5 MG PO TABS
2.5000 mg | ORAL_TABLET | Freq: Two times a day (BID) | ORAL | Status: DC
  Administered 2022-12-02 – 2022-12-03 (×2): 2.5 mg via ORAL
  Filled 2022-12-02 (×2): qty 1

## 2022-12-02 MED ORDER — ACETAZOLAMIDE 250 MG PO TABS
250.0000 mg | ORAL_TABLET | Freq: Once | ORAL | Status: AC
  Administered 2022-12-02: 250 mg via ORAL
  Filled 2022-12-02: qty 1

## 2022-12-02 SURGICAL SUPPLY — 5 items
CATH BALLN WEDGE 5F 110CM (CATHETERS) IMPLANT
DRAPE BRACHIAL (DRAPES) IMPLANT
GUIDEWIRE .025 260CM (WIRE) IMPLANT
SET ATX-X65L (MISCELLANEOUS) IMPLANT
SHEATH GLIDE SLENDER 4/5FR (SHEATH) IMPLANT

## 2022-12-02 NOTE — Progress Notes (Signed)
Patient ID: Kyle Roberson, male   DOB: [birth year], 82 y.o.   MRN: [id number]     Advanced Heart Failure Rounding Note  PCP-Cardiologist: Julien Nordmann, MD   Subjective:    Remains on Lasix gtt 15 mg/hr. Got 5 mg metolazone x1 yesterday. He remains on milrinone 0.25 mcg/kg/min.  I/Os net negative 1513 with weight down significantly.   Started on amiodarone gtt due to frequent PVCs on milrinone.   Not short of breath at rest.   Objective:   Weight Range: 108.9 kg Body mass index is 36.5 kg/m.   Vital Signs:   Temp:  [97.3 F (36.3 C)-98.6 F (37 C)] 97.5 F (36.4 C) (09/11 0400) Pulse Rate:  [65-84] 69 (09/11 0600) Resp:  [16-29] 21 (09/11 0600) BP: (116-156)/(37-121) 116/37 (09/11 0600) SpO2:  [88 %-98 %] 90 % (09/11 0600) Weight:  [108.9 kg] 108.9 kg (09/11 0500) Last BM Date : 11/30/22  Weight change: Filed Weights   11/30/22 0904 12/01/22 0500 12/02/22 0500  Weight: 112 kg 112.3 kg 108.9 kg   Intake/Output:   Intake/Output Summary (Last 24 hours) at 12/02/2022 0749 Last data filed at 12/02/2022 0600 Gross per 24 hour  Intake 1087.25 ml  Output 2600 ml  Net -1512.75 ml    Physical Exam   General: NAD Neck: JVP 12-14 cm, no thyromegaly or thyroid nodule.  Lungs: Clear to auscultation bilaterally with normal respiratory effort. CV: Nondisplaced PMI.  Heart irregular S1/S2, no S3/S4, no murmur.  1+ chronic edema to knees.  Abdomen: Soft, nontender, no hepatosplenomegaly, no distention.  Skin: Intact without lesions or rashes.  Neurologic: Alert and oriented x 3.  Psych: Normal affect. Extremities: No clubbing or cyanosis.  HEENT: Normal.   Telemetry   Atrial fibrillation rate 60s (personally reviewed)  Labs    CBC Recent Labs    12/02/22 0316  WBC 8.6  HGB 10.0*  HCT 31.7*  MCV 96.1  PLT 164   Basic Metabolic Panel Recent Labs    69/62/95 1824 12/02/22 0316  NA 137 137  K 4.0 4.1  CL 96* 96*  CO2 27 27  GLUCOSE 375* 428*  BUN 86*  82*  CREATININE 3.42* 3.33*  CALCIUM 8.7* 8.7*  MG 2.6* 2.5*   Liver Function Tests Recent Labs    11/30/22 0923  ALBUMIN 3.2*   No results for input(s): "LIPASE", "AMYLASE" in the last 72 hours. Cardiac Enzymes No results for input(s): "CKTOTAL", "CKMB", "CKMBINDEX", "TROPONINI" in the last 72 hours.  BNP: BNP (last 3 results) Recent Labs    06/22/22 1613 08/24/22 1559 11/26/22 1555  BNP 171.0* 170.1* 238.6*    ProBNP (last 3 results) No results for input(s): "PROBNP" in the last 8760 hours.   D-Dimer No results for input(s): "DDIMER" in the last 72 hours. Hemoglobin A1C No results for input(s): "HGBA1C" in the last 72 hours. Fasting Lipid Panel No results for input(s): "CHOL", "HDL", "LDLCALC", "TRIG", "CHOLHDL", "LDLDIRECT" in the last 72 hours. Thyroid Function Tests No results for input(s): "TSH", "T4TOTAL", "T3FREE", "THYROIDAB" in the last 72 hours.  Invalid input(s): "FREET3"  Other results:   Imaging   No results found.  Medications:   Scheduled Medications:  Chlorhexidine Gluconate Cloth  6 each Topical Daily   cholecalciferol  2,000 Units Oral Daily   docusate sodium  100 mg Oral BID   doxazosin  1 mg Oral BID   empagliflozin  10 mg Oral Daily   hydrALAZINE  100 mg Oral TID   isosorbide mononitrate  60 mg Oral Daily   multivitamin with minerals  1 tablet Oral Daily   neomycin-polymyxin b-dexamethasone  1 Application Right Eye TID   omega-3 acid ethyl esters  1 g Oral Daily   rosuvastatin  10 mg Oral Once per day on Monday Wednesday Friday   sodium chloride flush  3 mL Intravenous Q12H    Infusions:  sodium chloride     [START ON 12/03/2022] sodium chloride     amiodarone 30 mg/hr (12/02/22 0617)   furosemide (LASIX) 200 mg in dextrose 5 % 100 mL (2 mg/mL) infusion 15 mg/hr (12/02/22 0500)   milrinone 0.2511 mcg/kg/min (12/02/22 0500)    PRN Medications: sodium chloride, acetaminophen **OR** acetaminophen, calamine,  ipratropium-albuterol, ondansetron **OR** ondansetron (ZOFRAN) IV, polyethylene glycol, sodium chloride flush, traZODone  Assessment/Plan   1. Acute on chronic diastolic CHF: With prominent RV failure.  Echo this admission with EF 55-60%, mild LVH, moderate RV enlargement with mildly decreased RV systolic function, PASP 73 mmHg, severe LAE, mild-moderate TR, IVC dilated.  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 Creatinine appears to have stabilized 3.42 => 3.33. Now on milrinone up to 0.25 for RV support, no PICC placed with CKD stage IV. He diuresed well yesterday, weight down 7 lbs.  - RHC today to understand his hemodynamics. We discussed risks/benefits and he agrees to procedure.  - Continue Lasix to 15 mg/hr for now until RHC done, then will decide further treatment.  - Continue Jardiance for now.  - Continue milrinone 0.25 for RV support.   2. CKD stage IV: Complicates CHF.  Creatinine 2.98 => 3.07=>3.2 => 3.42 => 3.33.  Hopefully renal function has stabilized with milrinone.  - Further diuresis to be decided by RHC today.   3.  Atrial fibrillation: Permanent. Reasonable rate control.  - He is on apixaban 2.5 bid.  4. CAD: Remote PCI to LAD and diagonal.  No chest pain.  - Continue statin - No ASA with apixaban use.  5. 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   6. 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  He was started on amiodarone gtt, very rare PVCs now.  - Continue amiodarone gtt while on milrinone, will stop when milrinone titrated off.   Length of Stay: 6  Marca Ancona, MD  12/02/2022, 7:49 AM  Advanced Heart Failure Team Pager [phone] (M-F; 7a - 5p)  Please contact [facility] for night-coverage after hours (5p -7a ) and weekends on [email or web address]

## 2022-12-02 NOTE — Inpatient Diabetes Management (Signed)
Inpatient Diabetes Program Recommendations  AACE/ADA: New Consensus Statement on Inpatient Glycemic Control (2015)  Target Ranges:  Prepandial:   less than 140 mg/dL      Peak postprandial:   less than 180 mg/dL (1-2 hours)      Critically ill patients:  140 - 180 mg/dL    Latest Reference Range & Units 11/30/22 09:24 12/01/22 04:14 12/01/22 18:24 12/02/22 03:16  Glucose 70 - 99 mg/dL 960 (H) 454 (H) 098 (H) 428 (H)    Latest Reference Range & Units 11/28/22 07:54 11/30/22 15:51  Glucose-Capillary 70 - 99 mg/dL 119 (H) 147 (H)   Review of Glycemic Control  Diabetes history: DM2 Outpatient Diabetes medications: Glipizide XL 10 mg QAM Current orders for Inpatient glycemic control: Jardiance 10 mg daily  Inpatient Diabetes Program Recommendations:    Insulin: Noted patient was ordered Novolog correction and per Dr. Eliane Decree note on 12/01/22 insulin correction scale was discontinued per patient's wife's request. Lab glucose 375 on 12/01/22 and 428 mg/dl at 8:29 am today. Please consider ordering CBGs Ac&HS and Novolog 0-9 units TID with meals and Novolog 0-5 units at bedtime.  Thanks, Orlando Penner, RN, MSN, CDCES Diabetes Coordinator Inpatient Diabetes Program [phone] (Team Pager from 8am to 5pm)

## 2022-12-02 NOTE — Progress Notes (Signed)
OT Cancellation Note  Patient Details Name: Kyle Roberson MRN: [id number] DOB: 07/13/1940   Cancelled Treatment:    Reason Eval/Treat Not Completed: Medical issues which prohibited therapy. Chart reviewed. Pt scheduled for heart cath today and noted that pt was started on amiodarone gtt due to frequent PVCs on milrinone. Lastly, current BG falling outside safe guidelines for safe participation in OT. Will hold this date and re-attempt at later date/time as pt is more medically appropriate.   Arman Filter., MPH, MS, OTR/L ascom [phone] 12/02/22, 1:40 PM

## 2022-12-02 NOTE — Progress Notes (Signed)
[facility] [place], [place] 12/02/22  Subjective:   Hospital day # 6  Patient known to our practice from outpatient follow-up.  He was sent over to the emergency room for admission due to severe volume overload.  Previous weight in the office in April 2024 was 237 pounds. At his last office visit on 11/25/2022, weight had increased to 255 pounds. Patient was referred to the emergency room for IV diuresis.  Right heart cath performed today. Creatinine currently 3.3. Urine output 2.6 L over the preceding 24 hours.   Renal: 09/10 0701 - 09/11 0700 In: 1087.3 [P.O.:560; I.V.:527.3] Out: 2600 [Urine:2600] Lab Results  Component Value Date   CREATININE 3.33 (H) 12/02/2022   CREATININE 3.42 (H) 12/01/2022   CREATININE 3.20 (H) 12/01/2022     Objective:  Vital signs in last 24 hours:  Temp:  [97.5 F (36.4 C)-98.6 F (37 C)] 97.6 F (36.4 C) (09/11 1600) Pulse Rate:  [69-88] 76 (09/11 1600) Resp:  [16-30] 21 (09/11 1600) BP: (108-156)/(37-76) 117/48 (09/11 1600) SpO2:  [90 %-100 %] 91 % (09/11 1600) Weight:  [108.9 kg] 108.9 kg (09/11 0500)  Weight change: -3.1 kg Filed Weights   11/30/22 0904 12/01/22 0500 12/02/22 0500  Weight: 112 kg 112.3 kg 108.9 kg    Intake/Output:    Intake/Output Summary (Last 24 hours) at 12/02/2022 1715 Last data filed at 12/02/2022 1640 Gross per 24 hour  Intake 818.8 ml  Output 1650 ml  Net -831.2 ml     Physical Exam: General: Elderly gentleman, laying in the bed  HEENT Moist oral mucous membranes, lower eyelid conjunctival erythema.  Pulm/lungs Normal breathing effort, Wheezing  CVS/Heart Irregular, bradycardic  Abdomen:  Soft, nontender  Extremities: Significant edema to lower abdomen bilaterally  Neurologic: Alert, oriented  Skin: Skin changes from chronic edema.          Basic Metabolic Panel:  Recent Labs  Lab 11/29/22 0455 11/30/22 0924 12/01/22 0414 12/01/22 1824 12/02/22 0316 12/02/22 0935  12/02/22 0939  NA 140 140 141 137 137 137 136  K 3.5 3.8 3.9 4.0 4.1 4.1 4.0  CL 101 101 102 96* 96*  --   --   CO2 26 27 28 27 27   --   --   GLUCOSE 146* 216* 225* 375* 428*  --   --   BUN 63* 71* 79* 86* 82*  --   --   CREATININE 2.98* 3.07* 3.20* 3.42* 3.33*  --   --   CALCIUM 8.6* 8.4* 8.6* 8.7* 8.7*  --   --   MG  --   --   --  2.6* 2.5*  --   --      CBC: Recent Labs  Lab 11/26/22 1555 11/27/22 0245 12/02/22 0316 12/02/22 0935 12/02/22 0939  WBC 7.4 7.2 8.6  --   --   NEUTROABS 5.6  --   --   --   --   HGB 10.9* 11.0* 10.0* 10.5* 10.9*  HCT 34.0* 35.0* 31.7* 31.0* 32.0*  MCV 94.4 95.9 96.1  --   --   PLT 167 173 164  --   --      No results found for: "HEPBSAG", "HEPBSAB", "HEPBIGM"    Microbiology:  Recent Results (from the past 240 hour(s))  MRSA Next Gen by PCR, Nasal     Status: None   Collection Time: 11/30/22  4:10 PM   Specimen: Nasal Mucosa; Nasal Swab  Result Value Ref Range Status   MRSA  by PCR Next Gen NOT DETECTED NOT DETECTED Final    Comment: (NOTE) The GeneXpert MRSA Assay (FDA approved for NASAL specimens only), is one component of a comprehensive MRSA colonization surveillance program. It is not intended to diagnose MRSA infection nor to guide or monitor treatment for MRSA infections. Test performance is not FDA approved in patients less than 108 years old. Performed at [facility], [address] [place], [place] [address]     Coagulation Studies: No results for input(s): "LABPROT", "INR" in the last 72 hours.  Urinalysis: No results for input(s): "COLORURINE", "LABSPEC", "PHURINE", "GLUCOSEU", "HGBUR", "BILIRUBINUR", "KETONESUR", "PROTEINUR", "UROBILINOGEN", "NITRITE", "LEUKOCYTESUR" in the last 72 hours.  Invalid input(s): "APPERANCEUR"    Imaging: CARDIAC CATHETERIZATION  Result Date: 12/02/2022 1. Elevated right > left heart filling pressures. 2. Predominantly pulmonary venous hypertension. 3. Normal cardiac  output on milrinone. 4. PAPi preserved. Patient has left- and right-sided failure but predominantly right-sided.  This is going to be very difficult to manage with associated cardiorenal syndrome and creatinine in 3's range.     Medications:    sodium chloride     amiodarone 30 mg/hr (12/02/22 1500)   furosemide (LASIX) 200 mg in dextrose 5 % 100 mL (2 mg/mL) infusion 15 mg/hr (12/02/22 1500)   milrinone 0.25 mcg/kg/min (12/02/22 1500)    apixaban  2.5 mg Oral BID   Chlorhexidine Gluconate Cloth  6 each Topical Daily   cholecalciferol  2,000 Units Oral Daily   docusate sodium  100 mg Oral BID   doxazosin  1 mg Oral BID   empagliflozin  10 mg Oral Daily   hydrALAZINE  25 mg Oral TID   insulin aspart  0-5 Units Subcutaneous QHS   insulin aspart  0-9 Units Subcutaneous TID WC   isosorbide mononitrate  60 mg Oral Daily   multivitamin with minerals  1 tablet Oral Daily   neomycin-polymyxin b-dexamethasone  1 Application Right Eye TID   omega-3 acid ethyl esters  1 g Oral Daily   rosuvastatin  10 mg Oral Once per day on Monday Wednesday Friday   sodium chloride flush  3 mL Intravenous Q12H   sodium chloride, acetaminophen **OR** acetaminophen, calamine, ipratropium-albuterol, ondansetron **OR** ondansetron (ZOFRAN) IV, polyethylene glycol, sodium chloride flush, traZODone  Assessment/ Plan:  82 y.o. male with type 2 diabetes, coronary disease, chronic kidney disease, chronic diastolic CHF, bilateral lower extremity lymphedema, permanent atrial fibrillation, pulmonary hypertension, history of subdural hematoma  admitted on 11/26/2022 for Acute on chronic diastolic CHF (congestive heart failure) (HCC) [I50.33] Hypervolemia, unspecified hypervolemia type [E87.70]  Volume overload, lower extremity edema in the setting of acute on chronic diastolic CHF exacerbation and significant pulmonary hypertension. -Creatinine currently 3.3 with urine output of 2.6 L over the preceding 24 hours.   Appreciate cardiology input with right heart cath.  Agree that this will be difficult to manage given elevated right-sided pressures and preload dependence.  Continue Lasix drip for now.  Await further input from cardiology.  Diabetes mellitus type 2 with chronic kidney disease/chronic kidney disease stage IV Hopefully creatinine has plateaued at 3.3.  Good urine output noted at 2.6 L over the preceding 24 hours.  Continue Lasix drip as well as Jardiance.    LOS: 6 Kyle Roberson 9/11/20245:15 PM  [facility] [address] [place], [place] [phone]

## 2022-12-02 NOTE — Progress Notes (Signed)
Progress Note   Patient: Kyle Roberson HQI:[id number] DOB: 1940/11/27 DOA: 11/26/2022     6 DOS: the patient was seen and examined on 12/02/2022   Brief hospital course: 81yo with h/o CAD, stage 5 CKD, chronic diastolic CHF, BLE lymphedema, afib on Eliquis, PAH, and SDH presented on 9/5 with weight gain and SOB.  He was diagnosed with acute on chronic CHF and started on Lasix drip.  Cardiology is consulted and started IV midodrine drip and planned for RHC on 9/11.    Assessment and Plan:  Acute on chronic diastolic congestive heart failure Pulmonary HTN Presented with volume overload with increasing weight up to 255 pounds. Dry weight around ~220 pounds Echo with preserved EF, indeterminate diastolic function, severe pulmonary HTN Cardiology consulting Received Lasix 60 mg IV once -> Lasix drip per nephrology/cardiology, currently on 15 mg/hr Monitor input output and creatinine Duresing well He does NOT wear oxygen at home, per wife Started on Farxiga per Cardiology Transferred to ICU on 9/9 for IV milrinone gtt per [facility] heart failure MD.  Metolazone x1 RHC on 9/11 with elevated R > L filling pressures, mostly right-sided Overall poor prognosis given advanced renal disease   Cardiorenal syndrome Baseline stage 5 CKD Creatinine currently 3.3, slightly worse than baseline Nephrology consulting IV albumin per Nephrology   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 Recent A1c 7.3 Holding home glipizide Wife requested dc of SSI but this is added back to improve glycemic control   Morbid obesity Complicates overall prognosis Body mass index is 36.5 kg/m.Marland Kitchen  Weight loss should be encouraged Outpatient PCP/bariatric medicine f/u encouraged    Hypertension Continue doxazosin, hydralazine   Hyperlipidemia Continue rosuvastatin, Lovaza   CAD with remote PCI Continue Imdur No current c/o CP   History of paroxysmal a fib Amiodarone  drip    Consultants: Cardiology Nephrology PT/OT [facility] team  Procedures: Echocardiogram RHC on 9/11  Antibiotics: None  30 Day Unplanned Readmission Risk Score    Flowsheet Row ED to Hosp-Admission (Current) from 11/26/2022 in [facility]  30 Day Unplanned Readmission Risk Score (%) 26.97 Filed at 12/02/2022 0801       This score is the patient's risk of an unplanned readmission within 30 days of being discharged (0 -100%). The score is based on dignosis, age, lab data, medications, orders, and past utilization.   Low:  0-14.9   Medium: 15-21.9   High: 22-29.9   Extreme: 30 and above           Subjective: Somnolent post-cath.  No specific complaints.  L ankle pain after fall in the ER on admission.   Objective: Vitals:   12/02/22 0830 12/02/22 0845  BP: (!) 119/51 (!) 110/48  Pulse: 72 71  Resp:    Temp:    SpO2: 97% 92%    Intake/Output Summary (Last 24 hours) at 12/02/2022 0933 Last data filed at 12/02/2022 0600 Gross per 24 hour  Intake 1047.12 ml  Output 2600 ml  Net -1552.88 ml   Filed Weights   11/30/22 0904 12/01/22 0500 12/02/22 0500  Weight: 112 kg 112.3 kg 108.9 kg    Exam:  General:  Appears calm and comfortable and is in NAD Eyes:  prominent hyperemic lower eyelids ENT:  grossly normal hearing, lips & tongue, mmm Neck:  no LAD, masses or thyromegaly; +JVD Cardiovascular:  RRR, no m/r/g.  Respiratory:   CTA bilaterally with no wheezes/rales/rhonchi.  Normal respiratory effort. Abdomen:  soft, NT, ND Skin:  chronic lymphedema Musculoskeletal:  no bony abnormality - limited exam of L ankle due to lymphedema Psychiatric:  blunted mood and affect, speech sparse due to somnolence Neurologic:  unable to perform due to somnolence  Data Reviewed: I have reviewed the patient's lab results since admission.  Pertinent labs for today include:   Glucose 428 BUN 82/Creatinine 3.33/GFR 18 WBC 8.6 Hgb 10      Family Communication: Wife was present throughout evaluation  Disposition: Status is: Inpatient Remains inpatient appropriate because: critically ill  Total critical care time: 50 minutes Critical care time was exclusive of separately billable procedures and treating other patients. Critical care was necessary to treat or prevent imminent or life-threatening deterioration. Critical care was time spent personally by me on the following activities: development of treatment plan with patient and/or surrogate as well as nursing, discussions with consultants, evaluation of patient's response to treatment, examination of patient, obtaining history from patient or surrogate, ordering and performing treatments and interventions, ordering and review of laboratory studies, ordering and review of radiographic studies, pulse oximetry and re-evaluation of patient's condition.     Unresulted Labs (From admission, onward)     Start     Ordered   12/02/22 0500  Magnesium  Daily,   R     Comments: While on Milrinone   Question:  Specimen collection method  Answer:  Lab=Lab collect   12/01/22 0802   12/01/22 0500  Basic metabolic panel  Daily,   R      11/30/22 5784             Author: Jonah Blue, MD 12/02/2022 9:33 AM  For on call review [email or web address].

## 2022-12-02 NOTE — TOC Benefit Eligibility Note (Signed)
Patient Product/process development scientist completed.    The patient is insured through [employer]. Patient has Medicare and is not eligible for a copay card, but may be able to apply for patient assistance, if available.    Ran test claim for sildenafil (Revatio) 20 mg and Requires Prior Authorizaton  Ran test claim for sildenafil (PAH) 20 mg and Requires Prior Authorizaton  This test claim was processed through [employer]- copay amounts may vary at other pharmacies due to [employer], or as the patient moves through the different stages of their insurance plan.     Roland Earl, CPHT Pharmacy Technician III Certified Patient Advocate [facility] Patient Advocate Team Direct Number: [phone]  Fax: [phone]

## 2022-12-02 NOTE — Interval H&P Note (Signed)
History and Physical Interval Note:  12/02/2022 8:15 AM  Kyle Roberson  has presented today for surgery, with the diagnosis of CHF.  The various methods of treatment have been discussed with the patient and family. After consideration of risks, benefits and other options for treatment, the patient has consented to  Procedure(s): RIGHT HEART CATH (N/A) as a surgical intervention.  The patient's history has been reviewed, patient examined, no change in status, stable for surgery.  I have reviewed the patient's chart and labs.  Questions were answered to the patient's satisfaction.     Shaniyah Wix [employer]

## 2022-12-02 NOTE — TOC Progression Note (Signed)
Transition of Care [facility]) - Progression Note    Patient Details  Name: Kyle Roberson MRN: [id number] Date of Birth: 1940-08-04  [facility] CM/SW Contact  Chapman Fitch, RN Phone Number: 12/02/2022, 4:16 PM  Clinical Narrative:     Bed offer of [facility] presented to Wife.  She is to discuss with patient today.  She requested search extended to [employer]  Extended in HUB.  Wife to [place] and determine if there are any facilities she would like the referral to be sent to.  I ask that she pick her top 3      Expected Discharge Plan: Home/Self Care Barriers to Discharge: No Barriers Identified  Expected Discharge Plan and Services       Living arrangements for the past 2 months: Single Family Home                                       Social Determinants of Health (SDOH) Interventions SDOH Screenings   Food Insecurity: No Food Insecurity (12/01/2022)  Housing: Low Risk  (12/01/2022)  Transportation Needs: No Transportation Needs (12/01/2022)  Utilities: Not At Risk (12/01/2022)  Depression (PHQ2-9): Low Risk  (07/27/2022)  Tobacco Use: Medium Risk (11/26/2022)    Readmission Risk Interventions     No data to display

## 2022-12-02 NOTE — Progress Notes (Signed)
PT Cancellation Note  Patient Details Name: Eldin Neidich MRN: [id number] DOB: 06-Dec-1940   Cancelled Treatment:    Reason Eval/Treat Not Completed: Medical issues which prohibited therapy. PT to hold at this time (for 24 hours) due to initiation of medication (amiodarone). PT to re-attempt as medically appropriate.    Olga Coaster PT, DPT 1:41 PM,12/02/22

## 2022-12-02 NOTE — Plan of Care (Signed)
  Problem: Education: Goal: Ability to describe self-care measures that may prevent or decrease complications (Diabetes Survival Skills Education) will improve Outcome: Progressing   Problem: Fluid Volume: Goal: Ability to maintain a balanced intake and output will improve Outcome: Progressing   Problem: Nutritional: Goal: Maintenance of adequate nutrition will improve Outcome: Progressing Goal: Progress toward achieving an optimal weight will improve Outcome: Progressing   Problem: Tissue Perfusion: Goal: Adequacy of tissue perfusion will improve Outcome: Progressing

## 2022-12-02 NOTE — Hospital Course (Addendum)
HPI:  82yo with h/o CAD, stage 5 CKD, chronic diastolic CHF, BLE lymphedema, afib on Eliquis, PAH, and SDH presented on 09/05 with weight gain and SOB.    Hospital course / major events:  He was diagnosed with acute on chronic CHF and started on Lasix drip.  Cardiology was consulted. Echo this admission with EF 55-60%, mild LVH, moderate RV enlargement with mildly decreased RV systolic function, PASP 73 mmHg, severe LAE, mild-moderate TR, IVC dilated.  started IV midodrine drip 09/09.   RHC done on 09/11 with elevated R > L filling pressures, mostly right-sided, worsening renal function and decreased UOP so Lasix stopped.  Started on CRRT 09/12 for additional volume removal and completed this. 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  He is on IV Lasix. Has transitioned to TID HD Remains on heparin gtt for Afib, has been bradycardic w/ ventricular rates 40-50s but go into 60-70 range on standing.  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 Bradycardic currently Apixaban 2.5 bid on hold. Continue heparin for now per cardiology  No role for PPM currently especially in light of need for HD and additional nidus for blood stream infection   Anemia of chronic inflammation, cardiorenal disease ESRD Starting Epo  Hyperphosphatemia Binders per nephro  L ankle pain Fell in the ER Xray ordered and negative CAM walker for use with ambulation   Hypertension Continue doxazosin, hydralazine   Hyperlipidemia Continue rosuvastatin, Lovaza   CAD with remote PCI Continue Imdur, statin No ASA No current c/o CP   History of paroxysmal a fib Bradycardia currently so not on rate controlling medications On heparin drip for now, plan resume Eliquis per cardiology    Goals of care Patient appears disinterested in aggressive care but his wife is insistent She has declined palliative care consultation, "he's not there yet" He has a very poor overall prognosis based on cardiorenal failure and is not a good long-term HD candidate Ongoing discussions are encouraged regarding code status as well as possibly EOL care    Morbid obesity Complicates overall prognosis Body mass index is 36.5 kg/m.Marland Kitchen  Nutrition Problem: Increased nutrient  needs Etiology: acute illness (CRRT) Signs/Symptoms: estimated needs   Pressure ulcer  Pressure Injury 12/03/22 Buttocks Right;Left Stage 1 -  Intact skin with non-blanchable redness of a localized area usually over a bony prominence. Purple/blue (Active)  12/03/22 0820  Location: Buttocks  Location Orientation:  Right;Left  Staging: Stage 1 -  Intact skin with non-blanchable redness of a localized area usually over a bony prominence.  Wound Description (Comments): Purple/blue  Present on Admission:           Morbid obesity based on BMI: Body mass index is 10.73 kg/m.  And serious comorbid medical conditions   DVT prophylaxis: on heparin gtt IV fluids: no continuous IV fluids  Nutrition: low sodium Central lines / invasive devices:  [facility] HD cath   Code Status: FULL CODE ACP documentation reviewed: 12/12/22 none on file in [facility]  TOC needs: TBD. Renal navigator working on HD outpatient  Barriers to dispo / significant pending items: permcath placement early next week

## 2022-12-02 NOTE — H&P (View-Only) (Signed)
Patient ID: Kyle Roberson, male   DOB: [birth year], 82 y.o.   MRN: [id number]     Advanced Heart Failure Rounding Note  PCP-Cardiologist: Julien Nordmann, MD   Subjective:    Remains on Lasix gtt 15 mg/hr. Got 5 mg metolazone x1 yesterday. He remains on milrinone 0.25 mcg/kg/min.  I/Os net negative 1513 with weight down significantly.   Started on amiodarone gtt due to frequent PVCs on milrinone.   Not short of breath at rest.   Objective:   Weight Range: 108.9 kg Body mass index is 36.5 kg/m.   Vital Signs:   Temp:  [97.3 F (36.3 C)-98.6 F (37 C)] 97.5 F (36.4 C) (09/11 0400) Pulse Rate:  [65-84] 69 (09/11 0600) Resp:  [16-29] 21 (09/11 0600) BP: (116-156)/(37-121) 116/37 (09/11 0600) SpO2:  [88 %-98 %] 90 % (09/11 0600) Weight:  [108.9 kg] 108.9 kg (09/11 0500) Last BM Date : 11/30/22  Weight change: Filed Weights   11/30/22 0904 12/01/22 0500 12/02/22 0500  Weight: 112 kg 112.3 kg 108.9 kg   Intake/Output:   Intake/Output Summary (Last 24 hours) at 12/02/2022 0749 Last data filed at 12/02/2022 0600 Gross per 24 hour  Intake 1087.25 ml  Output 2600 ml  Net -1512.75 ml    Physical Exam   General: NAD Neck: JVP 12-14 cm, no thyromegaly or thyroid nodule.  Lungs: Clear to auscultation bilaterally with normal respiratory effort. CV: Nondisplaced PMI.  Heart irregular S1/S2, no S3/S4, no murmur.  1+ chronic edema to knees.  Abdomen: Soft, nontender, no hepatosplenomegaly, no distention.  Skin: Intact without lesions or rashes.  Neurologic: Alert and oriented x 3.  Psych: Normal affect. Extremities: No clubbing or cyanosis.  HEENT: Normal.   Telemetry   Atrial fibrillation rate 60s (personally reviewed)  Labs    CBC Recent Labs    12/02/22 0316  WBC 8.6  HGB 10.0*  HCT 31.7*  MCV 96.1  PLT 164   Basic Metabolic Panel Recent Labs    69/62/95 1824 12/02/22 0316  NA 137 137  K 4.0 4.1  CL 96* 96*  CO2 27 27  GLUCOSE 375* 428*  BUN 86*  82*  CREATININE 3.42* 3.33*  CALCIUM 8.7* 8.7*  MG 2.6* 2.5*   Liver Function Tests Recent Labs    11/30/22 0923  ALBUMIN 3.2*   No results for input(s): "LIPASE", "AMYLASE" in the last 72 hours. Cardiac Enzymes No results for input(s): "CKTOTAL", "CKMB", "CKMBINDEX", "TROPONINI" in the last 72 hours.  BNP: BNP (last 3 results) Recent Labs    06/22/22 1613 08/24/22 1559 11/26/22 1555  BNP 171.0* 170.1* 238.6*    ProBNP (last 3 results) No results for input(s): "PROBNP" in the last 8760 hours.   D-Dimer No results for input(s): "DDIMER" in the last 72 hours. Hemoglobin A1C No results for input(s): "HGBA1C" in the last 72 hours. Fasting Lipid Panel No results for input(s): "CHOL", "HDL", "LDLCALC", "TRIG", "CHOLHDL", "LDLDIRECT" in the last 72 hours. Thyroid Function Tests No results for input(s): "TSH", "T4TOTAL", "T3FREE", "THYROIDAB" in the last 72 hours.  Invalid input(s): "FREET3"  Other results:   Imaging   No results found.  Medications:   Scheduled Medications:  Chlorhexidine Gluconate Cloth  6 each Topical Daily   cholecalciferol  2,000 Units Oral Daily   docusate sodium  100 mg Oral BID   doxazosin  1 mg Oral BID   empagliflozin  10 mg Oral Daily   hydrALAZINE  100 mg Oral TID   isosorbide mononitrate  60 mg Oral Daily   multivitamin with minerals  1 tablet Oral Daily   neomycin-polymyxin b-dexamethasone  1 Application Right Eye TID   omega-3 acid ethyl esters  1 g Oral Daily   rosuvastatin  10 mg Oral Once per day on Monday Wednesday Friday   sodium chloride flush  3 mL Intravenous Q12H    Infusions:  sodium chloride     [START ON 12/03/2022] sodium chloride     amiodarone 30 mg/hr (12/02/22 0617)   furosemide (LASIX) 200 mg in dextrose 5 % 100 mL (2 mg/mL) infusion 15 mg/hr (12/02/22 0500)   milrinone 0.2511 mcg/kg/min (12/02/22 0500)    PRN Medications: sodium chloride, acetaminophen **OR** acetaminophen, calamine,  ipratropium-albuterol, ondansetron **OR** ondansetron (ZOFRAN) IV, polyethylene glycol, sodium chloride flush, traZODone  Assessment/Plan   1. Acute on chronic diastolic CHF: With prominent RV failure.  Echo this admission with EF 55-60%, mild LVH, moderate RV enlargement with mildly decreased RV systolic function, PASP 73 mmHg, severe LAE, mild-moderate TR, IVC dilated.  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 Creatinine appears to have stabilized 3.42 => 3.33. Now on milrinone up to 0.25 for RV support, no PICC placed with CKD stage IV. He diuresed well yesterday, weight down 7 lbs.  - RHC today to understand his hemodynamics. We discussed risks/benefits and he agrees to procedure.  - Continue Lasix to 15 mg/hr for now until RHC done, then will decide further treatment.  - Continue Jardiance for now.  - Continue milrinone 0.25 for RV support.   2. CKD stage IV: Complicates CHF.  Creatinine 2.98 => 3.07=>3.2 => 3.42 => 3.33.  Hopefully renal function has stabilized with milrinone.  - Further diuresis to be decided by RHC today.   3.  Atrial fibrillation: Permanent. Reasonable rate control.  - He is on apixaban 2.5 bid.  4. CAD: Remote PCI to LAD and diagonal.  No chest pain.  - Continue statin - No ASA with apixaban use.  5. 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   6. 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  He was started on amiodarone gtt, very rare PVCs now.  - Continue amiodarone gtt while on milrinone, will stop when milrinone titrated off.   Length of Stay: 6  Marca Ancona, MD  12/02/2022, 7:49 AM  Advanced Heart Failure Team Pager [phone] (M-F; 7a - 5p)  Please contact [facility] for night-coverage after hours (5p -7a ) and weekends on [email or web address]

## 2022-12-03 ENCOUNTER — Encounter: Payer: Self-pay | Admitting: Cardiology

## 2022-12-03 ENCOUNTER — Inpatient Hospital Stay: Payer: Medicare HMO

## 2022-12-03 DIAGNOSIS — I5033 Acute on chronic diastolic (congestive) heart failure: Secondary | ICD-10-CM | POA: Diagnosis not present

## 2022-12-03 DIAGNOSIS — Z515 Encounter for palliative care: Secondary | ICD-10-CM

## 2022-12-03 LAB — BASIC METABOLIC PANEL
Anion gap: 14 (ref 5–15)
BUN: 95 mg/dL — ABNORMAL HIGH (ref 8–23)
CO2: 26 mmol/L (ref 22–32)
Calcium: 8.3 mg/dL — ABNORMAL LOW (ref 8.9–10.3)
Chloride: 94 mmol/L — ABNORMAL LOW (ref 98–111)
Creatinine, Ser: 4.22 mg/dL — ABNORMAL HIGH (ref 0.61–1.24)
GFR, Estimated: 13 mL/min — ABNORMAL LOW (ref >60)
Glucose, Bld: 332 mg/dL — ABNORMAL HIGH (ref 70–99)
Potassium: 4.1 mmol/L (ref 3.5–5.1)
Sodium: 134 mmol/L — ABNORMAL LOW (ref 135–145)

## 2022-12-03 LAB — PROTIME-INR
INR: 1.6 — ABNORMAL HIGH (ref 0.8–1.2)
Prothrombin Time: 18.9 s — ABNORMAL HIGH (ref 11.4–15.2)

## 2022-12-03 LAB — APTT: aPTT: 52 s — ABNORMAL HIGH (ref 24–36)

## 2022-12-03 LAB — CBC WITH DIFFERENTIAL/PLATELET
Abs Immature Granulocytes: 0.04 10*3/uL (ref 0.00–0.07)
Basophils Absolute: 0 10*3/uL (ref 0.0–0.1)
Basophils Relative: 0 %
Eosinophils Absolute: 0.3 10*3/uL (ref 0.0–0.5)
Eosinophils Relative: 4 %
HCT: 30.1 % — ABNORMAL LOW (ref 39.0–52.0)
Hemoglobin: 9.5 g/dL — ABNORMAL LOW (ref 13.0–17.0)
Immature Granulocytes: 1 %
Lymphocytes Relative: 5 %
Lymphs Abs: 0.4 10*3/uL — ABNORMAL LOW (ref 0.7–4.0)
MCH: 29.9 pg (ref 26.0–34.0)
MCHC: 31.6 g/dL (ref 30.0–36.0)
MCV: 94.7 fL (ref 80.0–100.0)
Monocytes Absolute: 0.8 10*3/uL (ref 0.1–1.0)
Monocytes Relative: 11 %
Neutro Abs: 5.8 10*3/uL (ref 1.7–7.7)
Neutrophils Relative %: 79 %
Platelets: 170 10*3/uL (ref 150–400)
RBC: 3.18 MIL/uL — ABNORMAL LOW (ref 4.22–5.81)
RDW: 13.9 % (ref 11.5–15.5)
WBC: 7.3 10*3/uL (ref 4.0–10.5)
nRBC: 0 % (ref 0.0–0.2)

## 2022-12-03 LAB — RENAL FUNCTION PANEL
Albumin: 3.3 g/dL — ABNORMAL LOW (ref 3.5–5.0)
Anion gap: 14 (ref 5–15)
BUN: 93 mg/dL — ABNORMAL HIGH (ref 8–23)
CO2: 26 mmol/L (ref 22–32)
Calcium: 8.1 mg/dL — ABNORMAL LOW (ref 8.9–10.3)
Chloride: 94 mmol/L — ABNORMAL LOW (ref 98–111)
Creatinine, Ser: 4.26 mg/dL — ABNORMAL HIGH (ref 0.61–1.24)
GFR, Estimated: 13 mL/min — ABNORMAL LOW (ref >60)
Glucose, Bld: 343 mg/dL — ABNORMAL HIGH (ref 70–99)
Phosphorus: 4.8 mg/dL — ABNORMAL HIGH (ref 2.5–4.6)
Potassium: 4 mmol/L (ref 3.5–5.1)
Sodium: 134 mmol/L — ABNORMAL LOW (ref 135–145)

## 2022-12-03 LAB — GLUCOSE, CAPILLARY
Glucose-Capillary: 289 mg/dL — ABNORMAL HIGH (ref 70–99)
Glucose-Capillary: 265 mg/dL — ABNORMAL HIGH (ref 70–99)
Glucose-Capillary: 290 mg/dL — ABNORMAL HIGH (ref 70–99)
Glucose-Capillary: 308 mg/dL — ABNORMAL HIGH (ref 70–99)
Glucose-Capillary: 298 mg/dL — ABNORMAL HIGH (ref 70–99)
Glucose-Capillary: 214 mg/dL — ABNORMAL HIGH (ref 70–99)

## 2022-12-03 LAB — MAGNESIUM: Magnesium: 2.6 mg/dL — ABNORMAL HIGH (ref 1.7–2.4)

## 2022-12-03 MED ORDER — MIDAZOLAM HCL 2 MG/2ML IJ SOLN
1.0000 mg | Freq: Once | INTRAMUSCULAR | Status: AC
  Administered 2022-12-03: 1 mg via INTRAVENOUS
  Filled 2022-12-03: qty 2

## 2022-12-03 MED ORDER — HEPARIN (PORCINE) 25000 UT/250ML-% IV SOLN
1400.0000 [IU]/h | INTRAVENOUS | Status: DC
  Administered 2022-12-03: 1300 [IU]/h via INTRAVENOUS
  Administered 2022-12-04: 1100 [IU]/h via INTRAVENOUS
  Administered 2022-12-05: 1000 [IU]/h via INTRAVENOUS
  Administered 2022-12-06 – 2022-12-10 (×6): 1200 [IU]/h via INTRAVENOUS
  Administered 2022-12-13 – 2022-12-14 (×2): 1400 [IU]/h via INTRAVENOUS
  Filled 2022-12-03 (×13): qty 250

## 2022-12-03 MED ORDER — PRISMASOL BGK 4/2.5 32-4-2.5 MEQ/L REPLACEMENT SOLN
Status: DC
  Filled 2022-12-03: qty 5000

## 2022-12-03 MED ORDER — PRISMASOL BGK 4/2.5 32-4-2.5 MEQ/L EC SOLN: Status: DC

## 2022-12-03 MED ORDER — HEPARIN SODIUM (PORCINE) 1000 UNIT/ML DIALYSIS: 1000.0000 [IU] | INTRAMUSCULAR | Status: DC | PRN

## 2022-12-03 MED ORDER — FENTANYL CITRATE PF 50 MCG/ML IJ SOSY
12.5000 ug | PREFILLED_SYRINGE | INTRAMUSCULAR | Status: DC | PRN
  Administered 2022-12-05 (×2): 25 ug via INTRAVENOUS
  Filled 2022-12-03 (×3): qty 1

## 2022-12-03 MED ORDER — INSULIN ASPART 100 UNIT/ML IJ SOLN
0.0000 [IU] | Freq: Three times a day (TID) | INTRAMUSCULAR | Status: DC
  Administered 2022-12-04: 5 [IU] via SUBCUTANEOUS
  Administered 2022-12-04: 8 [IU] via SUBCUTANEOUS
  Administered 2022-12-04 (×2): 5 [IU] via SUBCUTANEOUS
  Administered 2022-12-05: 3 [IU] via SUBCUTANEOUS
  Administered 2022-12-05 (×3): 5 [IU] via SUBCUTANEOUS
  Administered 2022-12-06 (×2): 2 [IU] via SUBCUTANEOUS
  Administered 2022-12-06 – 2022-12-07 (×2): 3 [IU] via SUBCUTANEOUS
  Administered 2022-12-07 (×2): 2 [IU] via SUBCUTANEOUS
  Administered 2022-12-08: 5 [IU] via SUBCUTANEOUS
  Administered 2022-12-08: 2 [IU] via SUBCUTANEOUS
  Administered 2022-12-08: 3 [IU] via SUBCUTANEOUS
  Administered 2022-12-10: 5 [IU] via SUBCUTANEOUS
  Administered 2022-12-10: 3 [IU] via SUBCUTANEOUS
  Administered 2022-12-10: 5 [IU] via SUBCUTANEOUS
  Administered 2022-12-11 – 2022-12-12 (×3): 3 [IU] via SUBCUTANEOUS
  Administered 2022-12-13: 5 [IU] via SUBCUTANEOUS
  Administered 2022-12-15: 2 [IU] via SUBCUTANEOUS
  Administered 2022-12-15: 3 [IU] via SUBCUTANEOUS
  Administered 2022-12-16: 2 [IU] via SUBCUTANEOUS
  Administered 2022-12-18: 3 [IU] via SUBCUTANEOUS
  Administered 2022-12-18: 5 [IU] via SUBCUTANEOUS
  Administered 2022-12-18 – 2022-12-19 (×3): 3 [IU] via SUBCUTANEOUS
  Administered 2022-12-19: 6 [IU] via SUBCUTANEOUS
  Administered 2022-12-19: 3 [IU] via SUBCUTANEOUS
  Administered 2022-12-19: 2 [IU] via SUBCUTANEOUS
  Administered 2022-12-20: 8 [IU] via SUBCUTANEOUS
  Administered 2022-12-20: 2 [IU] via SUBCUTANEOUS
  Administered 2022-12-20 (×2): 3 [IU] via SUBCUTANEOUS
  Administered 2022-12-20: 2 [IU] via SUBCUTANEOUS
  Administered 2022-12-21: 11 [IU] via SUBCUTANEOUS
  Administered 2022-12-21: 15 [IU] via SUBCUTANEOUS
  Administered 2022-12-21: 8 [IU] via SUBCUTANEOUS
  Administered 2022-12-21: 15 [IU] via SUBCUTANEOUS
  Administered 2022-12-22: 11 [IU] via SUBCUTANEOUS
  Administered 2022-12-22 (×3): 3 [IU] via SUBCUTANEOUS
  Administered 2022-12-23: 5 [IU] via SUBCUTANEOUS
  Administered 2022-12-23: 2 [IU] via SUBCUTANEOUS
  Administered 2022-12-23 (×2): 3 [IU] via SUBCUTANEOUS
  Administered 2022-12-24: 2 [IU] via SUBCUTANEOUS
  Administered 2022-12-24: 8 [IU] via SUBCUTANEOUS
  Administered 2022-12-24: 2 [IU] via SUBCUTANEOUS
  Administered 2022-12-25: 11 [IU] via SUBCUTANEOUS
  Administered 2022-12-25: 5 [IU] via SUBCUTANEOUS
  Administered 2022-12-25: 3 [IU] via SUBCUTANEOUS
  Administered 2022-12-26: 2 [IU] via SUBCUTANEOUS
  Administered 2022-12-26: 11 [IU] via SUBCUTANEOUS
  Administered 2022-12-26 – 2022-12-27 (×2): 5 [IU] via SUBCUTANEOUS
  Administered 2022-12-27: 3 [IU] via SUBCUTANEOUS
  Administered 2022-12-27: 2 [IU] via SUBCUTANEOUS
  Administered 2022-12-28 (×2): 5 [IU] via SUBCUTANEOUS
  Administered 2022-12-28: 2 [IU] via SUBCUTANEOUS
  Administered 2022-12-29: 8 [IU] via SUBCUTANEOUS
  Administered 2022-12-29: 3 [IU] via SUBCUTANEOUS
  Administered 2022-12-29: 2 [IU] via SUBCUTANEOUS
  Administered 2022-12-30: 8 [IU] via SUBCUTANEOUS
  Administered 2022-12-30: 3 [IU] via SUBCUTANEOUS
  Administered 2022-12-31: 5 [IU] via SUBCUTANEOUS
  Administered 2022-12-31: 8 [IU] via SUBCUTANEOUS
  Administered 2022-12-31: 3 [IU] via SUBCUTANEOUS
  Administered 2023-01-01: 2 [IU] via SUBCUTANEOUS
  Administered 2023-01-01 (×2): 3 [IU] via SUBCUTANEOUS
  Administered 2023-01-01: 5 [IU] via SUBCUTANEOUS
  Administered 2023-01-02: 8 [IU] via SUBCUTANEOUS
  Administered 2023-01-03: 5 [IU] via SUBCUTANEOUS
  Administered 2023-01-03: 3 [IU] via SUBCUTANEOUS
  Administered 2023-01-04: 2 [IU] via SUBCUTANEOUS
  Administered 2023-01-04: 3 [IU] via SUBCUTANEOUS
  Filled 2022-12-03 (×73): qty 1

## 2022-12-03 MED ORDER — INSULIN ASPART 100 UNIT/ML IJ SOLN
3.0000 [IU] | Freq: Three times a day (TID) | INTRAMUSCULAR | Status: DC
  Administered 2022-12-03 – 2023-01-04 (×66): 3 [IU] via SUBCUTANEOUS
  Filled 2022-12-03 (×65): qty 1

## 2022-12-03 MED ORDER — FENTANYL CITRATE PF 50 MCG/ML IJ SOSY
25.0000 ug | PREFILLED_SYRINGE | Freq: Once | INTRAMUSCULAR | Status: AC
  Administered 2022-12-03: 25 ug via INTRAVENOUS
  Filled 2022-12-03: qty 1

## 2022-12-03 NOTE — Progress Notes (Signed)
Per Irven Coe, may use dialysis catheter. CRRT initiated with no complications. Continue to assess.

## 2022-12-03 NOTE — Inpatient Diabetes Management (Signed)
Inpatient Diabetes Program Recommendations  AACE/ADA: New Consensus Statement on Inpatient Glycemic Control   Target Ranges:  Prepandial:   less than 140 mg/dL      Peak postprandial:   less than 180 mg/dL (1-2 hours)      Critically ill patients:  140 - 180 mg/dL    Latest Reference Range & Units 11/28/22 07:54 11/30/22 15:51 12/02/22 11:37 12/02/22 16:24 12/02/22 21:52 12/03/22 05:59  Glucose-Capillary 70 - 99 mg/dL 474 (H) 259 (H) 563 (H) 394 (H) 366 (H) 289 (H)   Review of Glycemic Control  Diabetes history: DM2 Outpatient Diabetes medications: Glipizide XL 10 mg QAM Current orders for Inpatient glycemic control: Novolog 0-9 units TID with meals, Novolog 0-5 units at bedtime, Jardiance 10 mg daily  Inpatient Diabetes Program Recommendations:    Insulin: Please consider ordering Novolog 3 units TID with meals for meal coverage if patient eats at least 50% of meals. If glucose continues to be consistently over 180 mg/dl, please consider increasing Novolog correction scale to moderate 0-15 units TID with meals.  Thanks, Orlando Penner, RN, MSN, CDCES Diabetes Coordinator Inpatient Diabetes Program [phone] (Team Pager from 8am to 5pm)

## 2022-12-03 NOTE — IPAL (Signed)
  Interdisciplinary Goals of Care Family Meeting   Date carried out: 12/03/2022  Location of the meeting: Bedside  Member's involved: Physician, Nurse Practitioner, Bedside Registered Nurse, and Family Member or next of kin  Durable Power of Attorney or acting medical decision maker: Wife [name]   GOALS OF CARE DISCUSSION  The Clinical status was relayed to family in detail-Wife at Bedside  Updated and notified of patients medical condition- Explained to family course of therapy and the modalities   Patient with Progressive multiorgan failure with a very high probablity of a very minimal chance of meaningful recovery despite all aggressive and optimal medical therapy.    Family understands the situation. Patient  has end stage heart failure, has progressive end stage renal failure Patient is high risk for cardiac arrest and death   The wife and patient  have consented and agreed to DNR/DNI status We will start CRRT after vasc cath placed and assess cardiac and resp status I have explained that starting CRRT is at high risk for decompensation  Family are satisfied with Plan of action and management. All questions answered  Additional CC time 45 mins   Chenika Nevils Santiago Glad, M.D.  [facility]  Medical Director [facility] Medical Director [facility] Cardio-Pulmonary Department

## 2022-12-03 NOTE — Evaluation (Signed)
Occupational Therapy Re-Evaluation Patient Details Name: Kyle Roberson MRN: [id number] DOB: 02/19/1941 Today's Date: 12/03/2022   History of Present Illness Kyle Roberson is an 81yoM who comes [facility] from OP nephrology on 9/5 due to LEE and ABD swelling. PMH: CKD, CHF. As of 9/9 he was transferred to ICU for IV milrinone gtt per [facility] heart failure MD.   Clinical Impression   Pt seen for Re-evaluation of OT and co-tx with PT to optimize mobility efforts and safety. With encouragement, pt willing to participate and follow commands. Encouragement from spouse provided as well. Pt endorsing feeling much weaker than his baseline. Pt required MOD A +2 for bed mobility and MOD A +2 for STS transfers EOB with RW and VC for hand placement. Pt setup for grooming tasks. MIN A for washing his back while seated EOB. Pt required MIN-MOD A +2 for STS attempts and able to complete a few lateral steps EOB with MINA  +2 to complete with RW. Pt left in room with nursing, MDs, and spouse to discuss plan of care. Pt demonstrating progress towards goals; continues to benefit from skilled OT services.       If plan is discharge home, recommend the following: A lot of help with bathing/dressing/bathroom;Assistance with cooking/housework;Assist for transportation;Help with stairs or ramp for entrance;Two people to help with walking and/or transfers    Functional Status Assessment  Patient has had a recent decline in their functional status and demonstrates the ability to make significant improvements in function in a reasonable and predictable amount of time.  Equipment Recommendations  BSC/3in1    Recommendations for Other Services       Precautions / Restrictions Precautions Precautions: Fall Restrictions Weight Bearing Restrictions: No Other Position/Activity Restrictions: MD to order CAM boot for L foot for comfort with mobility      Mobility Bed Mobility Overal bed mobility: Needs Assistance Bed  Mobility: Rolling, Sidelying to Sit, Sit to Supine Rolling: Max assist, +2 for physical assistance Sidelying to sit: +2 for physical assistance, Mod assist   Sit to supine: Max assist, +2 for physical assistance        Transfers Overall transfer level: Needs assistance Equipment used: Rolling walker (2 wheels) Transfers: Sit to/from Stand Sit to Stand: Mod assist, +2 physical assistance           General transfer comment: MODA  +2 for initial STS, improving to MIN A +2, VC for hand placement      Balance Overall balance assessment: Needs assistance Sitting-balance support: Bilateral upper extremity supported, Single extremity supported, Feet supported Sitting balance-Leahy Scale: Fair     Standing balance support: Reliant on assistive device for balance, Bilateral upper extremity supported Standing balance-Leahy Scale: Poor                             ADL either performed or assessed with clinical judgement   ADL Overall ADL's : Needs assistance/impaired     Grooming: Sitting;Wash/dry hands;Wash/dry face;Set up;Supervision/safety                   Toilet Transfer: +2 for physical assistance;BSC/3in1;Stand-pivot;Rolling walker (2 wheels) Toilet Transfer Details (indicate cue type and reason): anticipated Toileting- Clothing Manipulation and Hygiene: Maximal assistance;Sit to/from stand               Vision         Perception         Praxis  Pertinent Vitals/Pain Pain Assessment Pain Assessment: Faces Faces Pain Scale: Hurts little more Pain Location: scrotum with transition to sitting and L ankle Pain Descriptors / Indicators: Discomfort, Grimacing, Moaning Pain Intervention(s): Limited activity within patient's tolerance, Monitored during session, Repositioned     Extremity/Trunk Assessment Upper Extremity Assessment Upper Extremity Assessment: Generalized weakness   Lower Extremity Assessment Lower Extremity  Assessment: Generalized weakness;LLE deficits/detail LLE Deficits / Details: ankle pain with WBing continues       Communication     Cognition Arousal: Alert Behavior During Therapy: WFL for tasks assessed/performed Overall Cognitive Status: Within Functional Limits for tasks assessed                                       General Comments       Exercises     Shoulder Instructions      Home Living Family/patient expects to be discharged to:: Private residence Living Arrangements: Spouse/significant other Available Help at Discharge: Family;Available PRN/intermittently (wife works outside the home) Type of Home: House Home Access: Ramped entrance     Home Layout: One level     Bathroom Shower/Tub: Psychologist, counselling;Tub/shower unit   Bathroom Toilet: Handicapped height Bathroom Accessibility: Yes How Accessible: Accessible via wheelchair Home Equipment: Cane - quad;Shower seat;Grab bars - toilet;Grab bars - tub/shower;Hand held shower head;Wheelchair Financial trader (4 wheels)   Additional Comments: lift chair      Prior Functioning/Environment Prior Level of Function : Needs assist             Mobility Comments: per wife AMB withquad cane or nothing for household mobility; at baseline does not go out of house for mobility unless needed ADLs Comments: Wife assists with ADL (min-modA)        OT Problem List: Decreased strength;Pain;Decreased range of motion;Decreased activity tolerance;Impaired balance (sitting and/or standing);Decreased knowledge of use of DME or AE;Obesity;Cardiopulmonary status limiting activity      OT Treatment/Interventions: Self-care/ADL training;Therapeutic exercise;Therapeutic activities;DME and/or AE instruction;Energy conservation;Patient/family education;Balance training    OT Goals(Current goals can be found in the care plan section) Acute Rehab OT Goals Patient Stated Goal: go home OT Goal Formulation: With  patient/family Time For Goal Achievement: 12/17/22 Potential to Achieve Goals: Good  OT Frequency: Min 1X/week    Co-evaluation PT/OT/SLP Co-Evaluation/Treatment: Yes Reason for Co-Treatment: For patient/therapist safety;To address functional/ADL transfers PT goals addressed during session: Mobility/safety with mobility;Balance;Proper use of DME OT goals addressed during session: ADL's and self-care      AM-PAC OT "6 Clicks" Daily Activity     Outcome Measure Help from another person eating meals?: None Help from another person taking care of personal grooming?: A Little Help from another person toileting, which includes using toliet, bedpan, or urinal?: A Lot Help from another person bathing (including washing, rinsing, drying)?: A Lot Help from another person to put on and taking off regular upper body clothing?: A Little Help from another person to put on and taking off regular lower body clothing?: A Lot 6 Click Score: 16   End of Session Equipment Utilized During Treatment: Rolling walker (2 wheels);Oxygen;Gait belt  Activity Tolerance: Patient tolerated treatment well Patient left: in bed;with call bell/phone within reach;with bed alarm set;with family/visitor present;with nursing/sitter in room;Other (comment) (MDs in room to discuss plan of care with pt and wife)  OT Visit Diagnosis: Other abnormalities of gait and mobility (R26.89);Muscle weakness (generalized) (M62.81);Pain Pain - Right/Left:  Left Pain - part of body: Ankle and joints of foot                Time: 9604-5409 OT Time Calculation (min): 37 min Charges:  OT General Charges $OT Visit: 1 Visit OT Evaluation $OT Re-eval: 1 Re-eval  Arman Filter., MPH, MS, OTR/L ascom [phone] 12/03/22, 2:53 PM

## 2022-12-03 NOTE — Consult Note (Signed)
ANTICOAGULATION CONSULT NOTE  Pharmacy Consult for IV Heparin Indication: atrial fibrillation  Patient Measurements: Height: 5\' 8"  (172.7 cm) Weight: 109 kg (240 lb 4.8 oz) IBW/kg (Calculated) : 68.4 Heparin Dosing Weight: 93 kg  Labs: Recent Labs    12/01/22 1824 12/02/22 0316 12/02/22 0316 12/02/22 0935 12/02/22 0939 12/03/22 0220  HGB  --  10.0*   < > 10.5* 10.9* 9.5*  HCT  --  31.7*   < > 31.0* 32.0* 30.1*  PLT  --  164  --   --   --  170  CREATININE 3.42* 3.33*  --   --   --  4.22*   < > = values in this interval not displayed.    Estimated Creatinine Clearance: 15.9 mL/min (A) (by C-G formula based on SCr of 4.22 mg/dL (H)).   Medical History: Past Medical History:  Diagnosis Date   AKI (acute kidney injury) (HCC) 07/06/2018   CAD (coronary artery disease)    a. Remote PCI/stenting to LAD w PTCA Diagnoal. RCA 60%; b.  2005/2008 Cardiolites w/ reportedly mild ischemia in Diag territory-->Med rx.   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 04/2019 Echo: EF 55-60%, no rwma, nl RV fxn, RVSP 55.65mmHg. Mod dil LA. Mild-mod MR. Mod dil PA.   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 04/2019 Echo: RVSP 55.36mmHg.   Permanent atrial fibrillation (HCC)    a. CHA2DS2VASc = 5-->dose adjusted eliquis (age/creat).   Subdural hematoma (HCC)    a. 01/2021 in setting of fall s/p L frontotemporal craniotomy.    Medications:  Apixaban 2.5 mg BID (last dose 9/12 at 1100)  Assessment: 82 y/o M with PMH as above admitted with acute on chronic diastolic CHF / RV failure complicated by CKD / cardiorenal syndrome. Patient is now being started on CRRT. Pharmacy consulted to transition apixaban to  heparin for now in case further procedures required.  Baseline aPTT and INR are pending. Baseline CBC notable for stable anemia.  Goal of Therapy:  Heparin level 0.3-0.7 units/ml aPTT 66 - 102 seconds Monitor platelets by anticoagulation protocol: Yes   Plan:  --Will start heparin 12h from last dose of apixaban --On 9/12 at 2300, start heparin at 1300 units/hr, no bolus --Will check aPTT 8 hours from initiation of infusion Follow aPTT until correlation established with HL --Daily CBC per protocol while on IV heparin  Tressie Ellis 12/03/2022,2:01 PM

## 2022-12-03 NOTE — Consult Note (Signed)
NAME:  Kyle Roberson, MRN:  [id number], DOB:  1940/09/29, LOS: 7 ADMISSION DATE:  11/26/2022, CONSULTATION DATE:  12/03/2022 REFERRING MD:  Dr. Cherylann Ratel, CHIEF COMPLAINT:  Volume Overload   Brief Pt Description / Synopsis:  82 y.o Male with PMHx significant for CKD Stage IV, HFpEF, A. Fib on Eliquis, PAH, SDH, and BLE lymphedema who presented with volume overload in the setting of Acute on Chronic HFpEF and progressive Cardiorenal Syndrome requiring inotropes and Lasix gtt. Ultimately requiring initiation of CRRT.  History of Present Illness:  Kyle Roberson is a 82 year old male with a past medical history significant for CKD stage V, HFpEF, permanent A-fib on Eliquis, pulmonary artery hypertension, hypertension, CAD, hyperlipidemia, bilateral lower extremity lymphedema, and subdural hematoma who presented to [facility] ED on 11/26/2022 from his outpatient nephrologist office due to increasing weight gain, bilateral lower extremity edema and shortness of breath.  His baseline weight is around 220 pounds, and at his nephrology appointment was found to be 255 pounds.  He denied chest pain, cough, fever.  His wife reported that over the last 6-week he has slowly been gaining weight and no longer responding to furosicx (Prior he had been having about a 1 to 2 pound weight loss with furosicx).  They reported he had been adherent to low-sodium diet and watching his fluid intake.  ED Course: Initial Vital Signs: Temperature 97.7 F orally, pulse 64, respiratory 20, blood pressure 174/68, SpO2 96% Significant Labs: Glucose 124, BUN 59, creatinine 2.85, BNP 238, hemoglobin 10.9, hematocrit 34 Imaging Chest X-ray 9/7>>IMPRESSION: 1. Moderate mixed pattern pulmonary edema. 2. Enlarged cardiomediastinal silhouette. Medications Administered: 60 mg IV Lasix x 1 dose  He was admitted by the hospitalist for further workup and treatment.  He was started on Lasix drip.  Advanced heart failure and nephrology were  consulted.  Please see "significant hospital events" section below for full detailed hospital course.  Pertinent  Medical History   Past Medical History:  Diagnosis Date   AKI (acute kidney injury) (HCC) 07/06/2018   CAD (coronary artery disease)    a. Remote PCI/stenting to LAD w PTCA Diagnoal. RCA 60%; b.  2005/2008 Cardiolites w/ reportedly mild ischemia in Diag territory-->Med rx.   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 04/2019 Echo: EF 55-60%, no rwma, nl RV fxn, RVSP 55.98mmHg. Mod dil LA. Mild-mod MR. Mod dil PA.   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 04/2019 Echo: RVSP 55.33mmHg.   Permanent atrial fibrillation (HCC)    a. CHA2DS2VASc = 5-->dose adjusted eliquis (age/creat).   Subdural hematoma (HCC)    a. 01/2021 in setting of fall s/p L frontotemporal craniotomy.    Micro Data:  9/9: MRSA PCR>>negative  Antimicrobials:   Anti-infectives (From admission, onward)    None       Significant Hospital Events: Including procedures, antibiotic start and stop dates in addition to other pertinent events   9/5: Presented to ED from nephrology office, admitted by hospitalist.  Advanced heart failure and nephrology consulted.  Started on Lasix drip. 9/6Peri [place] UOP with Lasix drip. 9/7: UOP 2.5 L, renal function stable.  Lasix drip increased to 8 mg/hr. 9/8: UOP 3l, lasix gtt continued.  9/9: Creatinine worsened, lasix gtt increassed to 15 mg/hr.  Started on empiric low dose milrione 0.125 for RV support. Plan for Right Heart Cath on Wednesday to assess CO. 9/10: UOP 2.5 L, pt fagitue and respiratory status some improved. Lasix gtt continued, Milrinone increased to 0.25. 9/11:  Started on  Amiodarone gtt due to frequent PVC's on Milrinone.  Underwent Right Heart Cath 9/12: UOP decreased to only 800 cc, BUN and Creatinine worsened.  Lasix discontinued.  Plan to start CRRT, PCCM asked to place temporary HD catheter and to consult.  Code status changed to DNR/DNI.  Interim History / Subjective:  -No significant events noted overnight -Afebrile, on Milrinone and Amiodarone infusions, not currently requiring vasopressors -Pt with worsening renal function, Creatinine up to 2.33 -UOP decreased to 800 cc over past 24 hrs ~ Lasix gtt has been d/c ~ Nephrology requests placement of temporary dialysis catheter for initiation of CRRT -Pt is awake and alert, on 1L [place], with mild respiratory distress following working with PT  Objective   Blood pressure (!) 143/64, pulse 74, temperature 97.8 F (36.6 C), resp. rate (!) 21, height 5\' 8"  (1.727 m), weight 109 kg, SpO2 98%.        Intake/Output Summary (Last 24 hours) at 12/03/2022 1206 Last data filed at 12/03/2022 4782 Gross per 24 hour  Intake 896.13 ml  Output 800 ml  Net 96.13 ml   Filed Weights   12/01/22 0500 12/02/22 0500 12/03/22 0500  Weight: 112.3 kg 108.9 kg 109 kg    Examination: General: Acute on chronically ill-appearing obese male, sitting in bed on nasal cannula, with mild respiratory distress HENT: Atraumatic, normocephalic, neck supple, positive JVD Lungs: Coarse breath sounds with mild expiratory wheezing throughout, even, mild tachypnea and mild accessory muscle use Cardiovascular: Irregular irregular rhythm, rate controlled, no murmurs, rubs, gallops Abdomen: Obese, soft, nontender, nondistended, no guarding or rebound tenderness, bowel sounds positive x 4 Extremities: Generalized weakness, lymphedema to bilateral lower extremities Neuro: Awake and alert, oriented x 3, moves all extremities to command, no focal deficits, speech clear GU: External male catheter in place draining dark yellow urine  Resolved  Hospital Problem list     Assessment & Plan:   #Acute on Chronic HFpEF, along with prominent RV failure #Permanent Atrial Fibrillation, rate currently controlled #Pulmonary Hypertension (suspect group 2 PH from elevated LVEDP) PMHx: CAD, HTN, HLD Echocardiogram 11/27/22: LVEF 55-60%, mild LVH, indeterminate diastolic parameters, mildly decreased RV function, RV moderately enlarged, severe pulmonary htn, mild to moderate TR Right Heart Cath 12/02/22:  right and left sided failure but RV failure predominated with RA pressure > 20.  -Continuous cardiac monitoring -Maintain MAP >65 -Vasopressors as needed to maintain MAP goal ~ currently not requiring -Advanced HF following, appreciate input -Diuresis as BP and renal function permits ~ Lasix gtt stopped due to worsening AKI ~ plan to initiate CRRT for volume removal -Continue Milrinone & Amiodarone as per Advanced HF -Continue home Eliquis for anticoagulation -Hold home antihypertensives (hydralazine, imdur)  #AKI in the setting of Cardiorenal Syndrome #Mild Hyponatremia, suspect Hypotonic Hypervolemic in setting of volume overload PMHx: CKD Stage IV -Monitor I&O's / urinary output -Follow BMP -Ensure adequate renal perfusion -Avoid nephrotoxic agents as able -Replace electrolytes as indicated ~ Pharmacy following for assistance with electrolyte replacement -Nephrology following, appreciate input ~ plan to start CRRT 9/12  #Acute Hypoxic Respiratory Failure in the setting of Pulmonary Edema and severe Pulmonary Hypertension -Supplemental O2 as needed to maintain O2 sats >92% -BiPAP if  needed (pt is DNR/DNI) -Follow intermittent Chest X-ray & ABG as needed -Bronchodilators prn -Volume removal with CRRT -Pulmonary toilet as able  #Diabetes Mellitus Type II -CBG's ac & qhs; Target range of 140 to 180 -SSI -Follow ICU Hypo/Hyperglycemia protocol     Patient is critically ill with acute decompensated CHF and progressive cardiorenal  syndrome.  Prognosis is quite guarded, high risk for further decompensation, cardiac arrest and death.  Given current acute illness superimposed on multiple chronic comorbidities and advanced age, overall long-term prognosis is poor.  Recommend DNR/DNI status.  Palliative care has been consulted to assist with goals of care conversations.   Best Practice (right click and "Reselect all SmartList Selections" daily)   Diet/type: Regular consistency (see orders) DVT prophylaxis: DOAC GI prophylaxis: N/A Lines: N/A Foley:  N/A Code Status:  DNR Last date of multidisciplinary goals of care discussion [N/A]  9/12:  Pt and his wife updated at bedside by myself and Dr. Belia Heman.  Plan to place temporary HD catheter and trial CRRT/HD.  Code status changed to DNR/DNI.  Labs   CBC: Recent Labs  Lab 11/26/22 1555 11/27/22 0245 12/02/22 0316 12/02/22 0935 12/02/22 0939 12/03/22 0220  WBC 7.4 7.2 8.6  --   --  7.3  NEUTROABS 5.6  --   --   --   --  5.8  HGB 10.9* 11.0* 10.0* 10.5* 10.9* 9.5*  HCT 34.0* 35.0* 31.7* 31.0* 32.0* 30.1*  MCV 94.4 95.9 96.1  --   --  94.7  PLT 167 173 164  --   --  170    Basic Metabolic Panel: Recent Labs  Lab 11/30/22 0924 12/01/22 0414 12/01/22 1824 12/02/22 0316 12/02/22 0935 12/02/22 0939 12/03/22 0220  NA 140 141 137 137 137 136 134*  K 3.8 3.9 4.0 4.1 4.1 4.0 4.1  CL 101 102 96* 96*  --   --  94*  CO2 27 28 27 27   --   --  26  GLUCOSE 216* 225* 375* 428*  --   --  332*  BUN 71* 79* 86* 82*  --   --  95*  CREATININE 3.07* 3.20* 3.42* 3.33*  --   --  4.22*  CALCIUM 8.4* 8.6* 8.7* 8.7*  --   --  8.3*  MG  --   --  2.6* 2.5*  --   --  2.6*   GFR: Estimated Creatinine Clearance: 15.9 mL/min (A) (by C-G formula based on SCr of 4.22 mg/dL (H)). Recent Labs  Lab 11/26/22 1555 11/27/22 0245 12/02/22 0316 12/03/22 0220  WBC 7.4 7.2 8.6 7.3    Liver Function Tests: Recent Labs  Lab 11/30/22 0923  ALBUMIN 3.2*   No results for input(s):  "LIPASE", "AMYLASE" in the last 168 hours. No results for input(s): "AMMONIA" in the last 168 hours.  ABG    Component Value Date/Time   HCO3 29.6 (H) 12/02/2022 0939   TCO2 31 12/02/2022 0939   O2SAT 61 12/02/2022 0939     Coagulation Profile: No results for input(s): "INR", "PROTIME" in the last 168 hours.  Cardiac Enzymes: No results for input(s): "CKTOTAL", "CKMB", "CKMBINDEX", "TROPONINI" in the last 168 hours.  HbA1C: Hemoglobin A1C  Date/Time Value Ref Range Status  07/22/2018 12:00 AM 10.0  Final   Hgb A1c MFr Bld  Date/Time Value Ref Range Status  06/22/2022 04:13 PM 7.3 (H) 4.8 - 5.6 % Final    Comment:    (NOTE)  Prediabetes: 5.7 - 6.4         Diabetes: >6.4         Glycemic control for adults with diabetes: <7.0   12/02/2021 04:56 PM 7.7 (H) 4.8 - 5.6 % Final    Comment:    (NOTE) Pre diabetes:          5.7%-6.4%  Diabetes:              >6.4%  Glycemic control for   <7.0% adults with diabetes     CBG: Recent Labs  Lab 12/02/22 1137 12/02/22 1624 12/02/22 2152 12/03/22 0559 12/03/22 0741  GLUCAP 379* 394* 366* 289* 265*    Review of Systems:   Positives in BOLD: Gen: Denies fever, chills, weight change, fatigue, night sweats HEENT: Denies blurred vision, double vision, hearing loss, tinnitus, sinus congestion, rhinorrhea, sore throat, neck stiffness, dysphagia PULM: Denies shortness of breath, cough, sputum production, hemoptysis, wheezing CV: Denies chest pain, edema, orthopnea, paroxysmal nocturnal dyspnea, palpitations GI: Denies abdominal pain, nausea, vomiting, diarrhea, hematochezia, melena, constipation, change in bowel habits GU: Denies dysuria, hematuria, polyuria, oliguria, urethral discharge Endocrine: Denies hot or cold intolerance, polyuria, polyphagia or appetite change Derm: Denies rash, dry skin, scaling or peeling skin change Heme: Denies easy bruising, bleeding, bleeding gums Neuro: Denies headache, numbness,  weakness, slurred speech, loss of memory or consciousness   Past Medical History:  He,  has a past medical history of AKI (acute kidney injury) (HCC) (07/06/2018), CAD (coronary artery disease), Cellulitis of lower extremity (05/31/2019), Chronic heart failure with preserved ejection fraction (HFpEF) (HCC), CKD (chronic kidney disease), stage IV (HCC), Diabetes (HCC), GERD (gastroesophageal reflux disease), History of MI (myocardial infarction) (06/27/2014), HLD (hyperlipidemia), HTN (hypertension), PAH (pulmonary artery hypertension) (HCC), Permanent atrial fibrillation (HCC), and Subdural hematoma (HCC).   Surgical History:   Past Surgical History:  Procedure Laterality Date   BLEPHAROPLASTY     CARDIAC CATHETERIZATION     CORONARY STENT INTERVENTION     CRANIOTOMY Left 02/04/2021   Procedure: CRANIOTOMY FOR LEFT SUBDURAL  HEMATOMA EVACUATION;  Surgeon: Lucy Chris, MD;  Location: [facility] ORS;  Service: Neurosurgery;  Laterality: Left;   CYSTOSCOPY     HERNIA REPAIR     RIGHT HEART CATH N/A 12/02/2022   Procedure: RIGHT HEART CATH;  Surgeon: Laurey Morale, MD;  Location: [facility] INVASIVE CV LAB;  Service: Cardiovascular;  Laterality: N/A;     Social History:   reports that he has quit smoking. He has never used smokeless tobacco. He reports that he does not currently use alcohol after a past usage of about 2.0 standard drinks of alcohol per week. He reports that he does not currently use drugs.   Family History:  His family history includes CAD in his father; Diabetes in his brother; Pancreatic cancer in his mother.   Allergies Allergies  Allergen Reactions   Atorvastatin Hives, Itching and Other (See Comments)    Other reaction(s): Other (See Comments)   Simvastatin Hives, Itching and Other (See Comments)    Other reaction(s): Other (See Comments)     Home Medications  Prior to Admission medications   Medication Sig Start Date End Date Taking? Authorizing Provider  apixaban  (ELIQUIS) 2.5 MG TABS tablet Take 1 tablet (2.5 mg total) by mouth 2 (two) times daily. 11/26/22  Yes Bensimhon, Bevelyn Buckles, MD  Ascorbic Acid (VITAMIN C PO) Take 1 tablet by mouth in the morning.   Yes [provider]  Cholecalciferol (VITAMIN D3) 1.25 MG (50000  UT) CAPS Take 1 tablet by mouth in the morning.   Yes [provider]  Coenzyme Q10-Vitamin E (QUNOL ULTRA COQ10 PO) Take 1 capsule by mouth in the morning.   Yes [provider]  doxazosin (CARDURA) 1 MG tablet TAKE 1 TABLET(1 MG) BY MOUTH TWICE DAILY 10/05/22  Yes Gollan, Tollie Pizza, MD  Flaxseed, Linseed, (FLAXSEED OIL PO) Take 1 capsule by mouth in the morning.   Yes [provider]  Furosemide (FUROSCIX) 80 MG/10ML CTKT Inject 80 mg into the skin as directed. 07/03/22  Yes Bensimhon, Bevelyn Buckles, MD  furosemide (LASIX) 40 MG tablet Take 1 tablet daily every other day, alternating with 2 tabs daily every other day 09/21/22  Yes Bensimhon, Bevelyn Buckles, MD  glipiZIDE (GLUCOTROL XL) 5 MG 24 hr tablet Take 1 tablet (5 mg total) by mouth daily with breakfast. Patient taking differently: Take 10 mg by mouth daily with breakfast. 07/27/22  Yes [name], MD  hydrALAZINE (APRESOLINE) 100 MG tablet TAKE 1 TABLET BY MOUTH THREE TIMES DAILY, WITH EXTRA 100 MG AS NEEDED FOR HIGH PRESSURE 10/05/22  Yes Gollan, Tollie Pizza, MD  isosorbide mononitrate (IMDUR) 30 MG 24 hr tablet Take 1 tablet (30 mg total) by mouth in the morning, at noon, and at bedtime. 07/10/22  Yes Bensimhon, Bevelyn Buckles, MD  Multiple Vitamin (MULTIVITAMIN WITH MINERALS) TABS tablet Take 1 tablet by mouth in the morning.   Yes [provider]  neomycin-polymyxin b-dexamethasone (MAXITROL) 3.5-10000-0.1 OINT Place 1 Application into the right eye 3 (three) times daily. 11/02/22  Yes [provider]  nitroGLYCERIN (NITROLINGUAL) 0.4 MG/SPRAY spray Place 1 spray under the tongue as directed. 06/22/22 07/11/23 Yes Bensimhon, Bevelyn Buckles, MD  Omega-3 Fatty  Acids (FISH OIL PO) Take 1 capsule by mouth in the morning.   Yes [provider]  rosuvastatin (CRESTOR) 10 MG tablet Take 1 tablet (10 mg total) by mouth 3 (three) times a week. 03/05/22  Yes Bensimhon, Bevelyn Buckles, MD  Turmeric (QC TUMERIC COMPLEX PO) Take 1 capsule by mouth in the morning. Qunol Turmeric Curcumin- 40 MG   Yes [provider]     Critical care time: 50 minutes     Harlon Ditty, AGACNP-BC [facility] Prefer epic messenger for cross cover needs If after hours, please call E-link

## 2022-12-03 NOTE — Progress Notes (Signed)
     Referral received for Kyle Roberson re: goals of care discussion. Chart reviewed.  I counseled with cardiologist Dr. Shirlee Latch, nephrologist Dr. Cherylann Ratel, CCM Dr. Belia Heman, and attending Dr. Ophelia Charter in regards to palliative care consult.    As per chart review and discussions with medical team, patient and wife have made it clear to other service lines that they do not wish to speak with palliative medicine at this time.   Patient and wife wish to pursue attempted trial of renal replacement therapy to see if there is improvement in his cardiorenal syndrome. After discussions with CCM Dr. Belia Heman, pt is a DNR and DNI. As per CCM, it would be appropriate for PMT to f/u with patient and wife tomorrow, 9/13.   PMT will f/u with patient and family tomorrow to gauge their willingness to engage in discussion with PMT.    PMT remains available to patient and family throughout his hospitalization.    Georgiann Cocker, FNP-BC Palliative Medicine Team   NO CHARGE

## 2022-12-03 NOTE — Progress Notes (Addendum)
Physical Therapy Treatment Patient Details Name: Kyle Roberson MRN: [id number] DOB: Aug 10, 1940 Today's Date: 12/03/2022   History of Present Illness Kyle Roberson is an 81yoM who comes to [facility] from OP nephrology on 9/5 due to LEE and ABD swelling. PMH: CKD, CHF. As of 9/9 he was transferred to ICU for IV milrinone gtt per [facility] heart failure MD.    PT Comments  Pt presents laying in bed with wife/MD in room, seen as PT/OT co-treat to maximize pt/therapist safety and function. He performed rolling to R maxAx2, S/L to sit modAx2/ HOB elevated, 2 sit<>stands min-modAx2/RW, and sit >supine maxAx2 due to fatigue. Pt able to side step towards HOB, with minA/RW and needing 1 seated rest break halfway. Sit<>stand also improved with second attempt, requiring decreased level of assist. Pt overall exhibited improved willingness to participate in therapy but continues to require maximal encouragement from therapist/wife to initiate session. He would benefit from continued skilled therapy to maximize functional abilities and address deficits in activity tolerance.    If plan is discharge home, recommend the following: A lot of help with walking and/or transfers;A lot of help with bathing/dressing/bathroom;Assist for transportation;Help with stairs or ramp for entrance;Assistance with cooking/housework   Can travel by private vehicle     No  Equipment Recommendations  None recommended by PT    Recommendations for Other Services       Precautions / Restrictions Precautions Precautions: Fall Restrictions Weight Bearing Restrictions: No Other Position/Activity Restrictions: MD to order CAM boot for L foot for comfort with mobility     Mobility  Bed Mobility Overal bed mobility: Needs Assistance Bed Mobility: Rolling, Sidelying to Sit, Sit to Supine Rolling: Max assist, +2 for physical assistance Sidelying to sit: +2 for physical assistance, Mod assist, HOB elevated   Sit to supine: Max assist,  +2 for physical assistance        Transfers Overall transfer level: Needs assistance Equipment used: Rolling walker (2 wheels) Transfers: Sit to/from Stand Sit to Stand: Mod assist, +2 physical assistance           General transfer comment: MODA  +2 for initial STS, improving to MIN A +2, VC for hand placement    Ambulation/Gait                   Stairs             Wheelchair Mobility     Tilt Bed    Modified Rankin (Stroke Patients Only)       Balance Overall balance assessment: Needs assistance Sitting-balance support: Bilateral upper extremity supported, Single extremity supported, Feet supported Sitting balance-Leahy Scale: Fair Sitting balance - Comments: able to maintain sitting, but began to fatigue after ~51min resulting in lateral lean   Standing balance support: Reliant on assistive device for balance, Bilateral upper extremity supported Standing balance-Leahy Scale: Poor Standing balance comment: heavy UE reliance on RW                            Cognition Arousal: Alert Behavior During Therapy: WFL for tasks assessed/performed Overall Cognitive Status: Within Functional Limits for tasks assessed                                          Exercises Other Exercises Other Exercises: lateral step to [facility] with minA/RW, needing 1 rest  break halfway    General Comments General comments (skin integrity, edema, etc.): SPO2 remained >90% throughout session      Pertinent Vitals/Pain Pain Assessment Pain Assessment: Faces Faces Pain Scale: Hurts little more Pain Location: scrotum with transition to sitting and L ankle Pain Descriptors / Indicators: Discomfort, Grimacing, Moaning Pain Intervention(s): Monitored during session, Repositioned, Limited activity within patient's tolerance    Home Living Family/patient expects to be discharged to:: Private residence Living Arrangements: Spouse/significant  other Available Help at Discharge: Family;Available PRN/intermittently (wife works outside the home) Type of Home: House Home Access: Ramped entrance       Home Layout: One level Home Equipment: Cane - quad;Shower seat;Grab bars - toilet;Grab bars - tub/shower;Hand held shower head;Wheelchair Financial trader (4 wheels) Additional Comments: lift chair    Prior Function            PT Goals (current goals can now be found in the care plan section) Acute Rehab PT Goals Patient Stated Goal: remain supervision level for moblity, avoid falling Progress towards PT goals: Progressing toward goals    Frequency    Min 1X/week      PT Plan      Co-evaluation   Reason for Co-Treatment: For patient/therapist safety;To address functional/ADL transfers PT goals addressed during session: Mobility/safety with mobility;Balance;Proper use of DME OT goals addressed during session: ADL's and self-care      AM-PAC PT "6 Clicks" Mobility   Outcome Measure  Help needed turning from your back to your side while in a flat bed without using bedrails?: A Lot Help needed moving from lying on your back to sitting on the side of a flat bed without using bedrails?: A Lot Help needed moving to and from a bed to a chair (including a wheelchair)?: A Lot Help needed standing up from a chair using your arms (e.g., wheelchair or bedside chair)?: A Lot Help needed to walk in hospital room?: A Lot Help needed climbing 3-5 steps with a railing? : Total 6 Click Score: 11    End of Session Equipment Utilized During Treatment: Gait belt Activity Tolerance: Patient tolerated treatment well;No increased pain Patient left: in bed;with family/visitor present;with call bell/phone within reach;Other (comment) (MD in room)   PT Visit Diagnosis: Unsteadiness on feet (R26.81);Muscle weakness (generalized) (M62.81)     Time: 1610-9604 PT Time Calculation (min) (ACUTE ONLY): 36 min  Charges:    $Therapeutic  Activity: 8-22 mins PT General Charges $$ ACUTE PT VISIT: 1 Visit                    Tatym Schermer, PT, SPT 3:18 PM,12/03/22

## 2022-12-03 NOTE — Progress Notes (Signed)
Progress Note   Patient: Kyle Roberson GMW:[id number] DOB: 09-23-1940 DOA: 11/26/2022     82 DOS: the patient was seen and examined on 12/03/2022   Brief hospital course: 82yo with h/o CAD, stage 5 CKD, chronic diastolic CHF, BLE lymphedema, afib on Eliquis, PAH, and SDH presented on 9/5 with weight gain and SOB.  He was diagnosed with acute on chronic CHF and started on Lasix drip.  Cardiology is consulting and started IV midodrine drip.  RHC done on 9/11 with worsening renal function and decreased UOP so Lasix stopped.  Considering CVVHD but this is likely to lead to HD dependence.  Assessment and Plan:  Acute on chronic diastolic congestive heart failure Pulmonary HTN Presented with volume overload with increasing weight up to 255 pounds. Dry weight around ~220 pounds Echo with preserved EF, indeterminate diastolic function, severe pulmonary HTN Cardiology consulting Received Lasix 60 mg IV once -> Lasix drip per nephrology/cardiology, was on 15 mg/hr but this is currently held due to worsening renal function Monitor input output and creatinine He does NOT wear oxygen at home, per wife Started on Farxiga per Cardiology - consider holding this medication Transferred to ICU on 9/9 for IV milrinone gtt per [facility] heart failure MD.  Metolazone x1 RHC on 9/11 with elevated R > L filling pressures, mostly right-sided Overall poor prognosis given advanced renal disease   Cardiorenal syndrome Baseline stage 5 CKD Significantly worse today Nephrology consulting IV albumin per Nephrology Starting CRRT, concern for need for chronic HD Overall very poor prognosis and patient is opposed to HD but his wife is insistent that he will change his mind; palliative care consult declined by his wife for the last 2 days Based on initiation of CRRT, patient will be transferred to the ICU service at this time; TR[facility] will be happy to resume care when appropriate   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 Recent A1c 7.3 Holding home glipizide Wife requested dc of SSI but this is added back to improve glycemic control  L ankle pain Fell in the ER Xray ordered and negative Will order CAM walker for use with ambulation   Morbid obesity Complicates overall prognosis Body mass index is 36.5 kg/m.Marland Kitchen  Weight loss should be encouraged Outpatient PCP/bariatric medicine f/u encouraged    Hypertension Continue doxazosin, hydralazine   Hyperlipidemia Continue rosuvastatin, Lovaza   CAD with remote PCI Continue Imdur No current c/o CP   History of paroxysmal a fib Amiodarone drip  Goals of care Patient appears disinterested in aggressive care but his wife is insistent She has declined palliative care consultation the last 2 days, "he's not there yet" He has a very poor overall prognosis based on cardiorenal failure and is not a good long-term HD candidate Ongoing discussions are encouraged regarding code status as well as possibly EOL care       Consultants: Cardiology Nephrology PCCM PT/OT [facility] team   Procedures: Echocardiogram RHC on 9/11   Antibiotics: None  30 Day Unplanned Readmission Risk Score    Flowsheet Row ED to Hosp-Admission (Current) from 11/26/2022 in [facility] ICU/CCU  30 Day Unplanned Readmission Risk Score (%) 27.07 Filed at 12/03/2022 0801       84 risk is the patient's risk of an unplanned readmission within 30 days of being discharged (0 -100%). The score is based on dignosis, age, lab data, medications, orders, and past utilization.   Low:  0-14.9   Medium: 15-21.9  High: 22-29.9   Extreme: 30 and above           Subjective: He reports that he is tired, does not want dialysis.   Objective: Vitals:   12/03/22 0700 12/03/22 0800  BP: (!) 122/51 (!) 121/51  Pulse: 71 71  Resp: (!) 22 (!) 28  Temp: 97.8 F (36.6 C)   SpO2: 96% 93%    Intake/Output Summary (Last 24 hours) at  12/03/2022 6270 Last data filed at 12/03/2022 3500 Gross per 24 hour  Intake 896.13 ml  Output 800 ml  Net 96.13 ml   Filed Weights   12/01/22 0500 12/02/22 0500 12/03/22 0500  Weight: 112.3 kg 108.9 kg 109 kg    Exam:  General:  Appears calm and comfortable and is in NAD Eyes:  prominent hyperemic lower eyelids ENT:  grossly normal hearing, lips & tongue, mmm Neck:  no LAD, masses or thyromegaly; +JVD Cardiovascular:  RRR Respiratory:   CTA bilaterally with no wheezes/rales/rhonchi.  Normal to mildly increased respiratory effort. Abdomen:  soft, NT, ND Skin:  chronic lymphedema Musculoskeletal:  no bony abnormality - limited exam of L ankle due to lymphedema Psychiatric:  blunted mood and affect, speech limited but appropriate Neurologic:  grossly normal CN   Data Reviewed: I have reviewed the patient's lab results since admission.  Pertinent labs for today include:   Glucose 332 BUN 95/Creatinine 4.22/GFR 13 WBC 7.3 Hgb 9.5     Family Communication: Wife was present throughout evaluation  Disposition: Status is: Inpatient Remains inpatient appropriate because: critically ill  Total critical care time: 45 minutes Critical care time was exclusive of separately billable procedures and treating other patients. Critical care was necessary to treat or prevent imminent or life-threatening deterioration. Critical care was time spent personally by me on the following activities: development of treatment plan with patient and/or surrogate as well as nursing, discussions with consultants, evaluation of patient's response to treatment, examination of patient, obtaining history from patient or surrogate, ordering and performing treatments and interventions, ordering and review of laboratory studies, ordering and review of radiographic studies, pulse oximetry and re-evaluation of patient's condition.   Unresulted Labs (From admission, onward)     Start     Ordered   12/02/22 0500   Magnesium  Daily,   R     Comments: While on Milrinone   Question:  Specimen collection method  Answer:  Lab=Lab collect   12/01/22 0802   12/01/22 0500  Basic metabolic panel  Daily,   R      11/30/22 9381             Author: Jonah Blue, MD 12/03/2022 9:18 AM  For on call review [email or web address].

## 2022-12-03 NOTE — Procedures (Signed)
Central Venous Catheter Insertion Procedure Note  Giro Potts  [id number]  March 29, 1940  Date:12/03/22  Time:1:03 PM   Provider Performing:Cj Beecher D Elvina Sidle   Procedure: Insertion of Non-tunneled Central Venous Catheter(36556)with US guidance (43329)    Indication(s) Medication administration, Difficult access, and Hemodialysis  Consent Risks of the procedure as well as the alternatives and risks of each were explained to the patient and/or caregiver.  Consent for the procedure was obtained and is signed in the bedside chart  Anesthesia Topical only with 1% lidocaine   Timeout Verified patient identification, verified procedure, site/side was marked, verified correct patient position, special equipment/implants available, medications/allergies/relevant history reviewed, required imaging and test results available.  Sterile Technique Maximal sterile technique including full sterile barrier drape, hand hygiene, sterile gown, sterile gloves, mask, hair covering, sterile ultrasound probe cover (if used).  Procedure Description Area of catheter insertion was cleaned with chlorhexidine and draped in sterile fashion.   With real-time ultrasound guidance a HD catheter was placed into the left internal jugular vein.  Nonpulsatile blood flow and easy flushing noted in all ports.  The catheter was sutured in place and sterile dressing applied.  Complications/Tolerance None; patient tolerated the procedure well. Chest X-ray is ordered to verify placement for internal jugular or subclavian cannulation.  Chest x-ray is not ordered for femoral cannulation.  EBL Minimal  Specimen(s) None   Line secured at the 20 cm mark. BIOPATCH applied to insertion site.   Harlon Ditty, AGACNP-BC [facility] Prefer epic messenger for cross cover needs If after hours, please call E-link

## 2022-12-03 NOTE — Progress Notes (Signed)
[facility] [place], [place] 12/03/22  Subjective:   Hospital day # 7  Patient known to our practice from outpatient follow-up.  He was sent over to the emergency room for admission due to severe volume overload.  Previous weight in the office in April 2024 was 237 pounds. At his last office visit on 11/25/2022, weight had increased to 255 pounds. Patient was referred to the emergency room for IV diuresis.  The patient's renal function is worse today. Creatinine up to 4.22. Urine output dropped to 800 cc over the kidney 24 hours. Case discussed in depth with family as well as with Dr. Shirlee Latch. At this point family continues to desire aggressive care and a trial of renal placement therapy.   Renal: 09/11 0701 - 09/12 0700 In: 785.5 [P.O.:120; I.V.:665.5] Out: 800 [Urine:800] Lab Results  Component Value Date   CREATININE 4.22 (H) 12/03/2022   CREATININE 3.33 (H) 12/02/2022   CREATININE 3.42 (H) 12/01/2022     Objective:  Vital signs in last 24 hours:  Temp:  [97.3 F (36.3 C)-97.8 F (36.6 C)] 97.8 F (36.6 C) (09/12 0700) Pulse Rate:  [56-96] 74 (09/12 1100) Resp:  [19-29] 21 (09/12 1100) BP: (107-143)/(45-95) 143/64 (09/12 1100) SpO2:  [90 %-98 %] 98 % (09/12 1100) Weight:  [109 kg] 109 kg (09/12 0500)  Weight change: 0.1 kg Filed Weights   12/01/22 0500 12/02/22 0500 12/03/22 0500  Weight: 112.3 kg 108.9 kg 109 kg    Intake/Output:    Intake/Output Summary (Last 24 hours) at 12/03/2022 1207 Last data filed at 12/03/2022 1610 Gross per 24 hour  Intake 896.13 ml  Output 800 ml  Net 96.13 ml     Physical Exam: General: Chronically ill-appearing  HEENT Moist oral mucous membranes, lower eyelid conjunctival erythema.  Pulm/lungs Bilateral rales and rhonchi, normal effort  CVS/Heart Irregular  Abdomen:  Soft, nontender  Extremities: Significant edema to lower abdomen bilaterally  Neurologic: Alert, oriented  Skin: Skin changes from  chronic edema.          Basic Metabolic Panel:  Recent Labs  Lab 11/30/22 0924 12/01/22 0414 12/01/22 1824 12/02/22 0316 12/02/22 0935 12/02/22 0939 12/03/22 0220  NA 140 141 137 137 137 136 134*  K 3.8 3.9 4.0 4.1 4.1 4.0 4.1  CL 101 102 96* 96*  --   --  94*  CO2 27 28 27 27   --   --  26  GLUCOSE 216* 225* 375* 428*  --   --  332*  BUN 71* 79* 86* 82*  --   --  95*  CREATININE 3.07* 3.20* 3.42* 3.33*  --   --  4.22*  CALCIUM 8.4* 8.6* 8.7* 8.7*  --   --  8.3*  MG  --   --  2.6* 2.5*  --   --  2.6*     CBC: Recent Labs  Lab 11/26/22 1555 11/27/22 0245 12/02/22 0316 12/02/22 0935 12/02/22 0939 12/03/22 0220  WBC 7.4 7.2 8.6  --   --  7.3  NEUTROABS 5.6  --   --   --   --  5.8  HGB 10.9* 11.0* 10.0* 10.5* 10.9* 9.5*  HCT 34.0* 35.0* 31.7* 31.0* 32.0* 30.1*  MCV 94.4 95.9 96.1  --   --  94.7  PLT 167 173 164  --   --  170     No results found for: "HEPBSAG", "HEPBSAB", "HEPBIGM"    Microbiology:  Recent Results (from the past 240 hour(s))  MRSA Next Gen by PCR, Nasal     Status: None   Collection Time: 11/30/22  4:10 PM   Specimen: Nasal Mucosa; Nasal Swab  Result Value Ref Range Status   MRSA by PCR Next Gen NOT DETECTED NOT DETECTED Final    Comment: (NOTE) The GeneXpert MRSA Assay (FDA approved for NASAL specimens only), is one component of a comprehensive MRSA colonization surveillance program. It is not intended to diagnose MRSA infection nor to guide or monitor treatment for MRSA infections. Test performance is not FDA approved in patients less than 65 years old. Performed at [facility], [address] [place], [place] [address]     Coagulation Studies: No results for input(s): "LABPROT", "INR" in the last 72 hours.  Urinalysis: No results for input(s): "COLORURINE", "LABSPEC", "PHURINE", "GLUCOSEU", "HGBUR", "BILIRUBINUR", "KETONESUR", "PROTEINUR", "UROBILINOGEN", "NITRITE", "LEUKOCYTESUR" in the last 72 hours.  Invalid  input(s): "APPERANCEUR"    Imaging: DG Ankle Left Port  Result Date: 12/02/2022 CLINICAL DATA:  Ankle pain EXAM: PORTABLE LEFT ANKLE - 2 VIEW COMPARISON:  None Available. FINDINGS: Vascular and soft tissue calcifications. No fracture or malalignment. Ankle mortise is symmetric. Mild medial degenerative change. Moderate plantar calcaneal spur IMPRESSION: No acute osseous abnormality. Mild degenerative change. Plantar calcaneal spur. Electronically Signed   By: Jasmine Pang M.D.   On: 12/02/2022 21:51   CARDIAC CATHETERIZATION  Result Date: 12/02/2022 1. Elevated right > left heart filling pressures. 2. Predominantly pulmonary venous hypertension. 3. Normal cardiac output on milrinone. 4. PAPi preserved. Patient has left- and right-sided failure but predominantly right-sided.  This is going to be very difficult to manage with associated cardiorenal syndrome and creatinine in 3's range.     Medications:    sodium chloride     amiodarone 30 mg/hr (12/03/22 0718)   milrinone 0.25 mcg/kg/min (12/03/22 0804)    apixaban  2.5 mg Oral BID   Chlorhexidine Gluconate Cloth  6 each Topical Daily   cholecalciferol  2,000 Units Oral Daily   docusate sodium  100 mg Oral BID   doxazosin  1 mg Oral BID   empagliflozin  10 mg Oral Daily   fentaNYL (SUBLIMAZE) injection  25 mcg Intravenous Once   insulin aspart  0-5 Units Subcutaneous QHS   insulin aspart  0-9 Units Subcutaneous TID WC   midazolam  1 mg Intravenous Once   multivitamin with minerals  1 tablet Oral Daily   neomycin-polymyxin b-dexamethasone  1 Application Right Eye TID   omega-3 acid ethyl esters  1 g Oral Daily   rosuvastatin  10 mg Oral Once per day on Monday Wednesday Friday   sodium chloride flush  3 mL Intravenous Q12H   sodium chloride, acetaminophen **OR** acetaminophen, calamine, ipratropium-albuterol, ondansetron **OR** ondansetron (ZOFRAN) IV, polyethylene glycol, sodium chloride flush, traZODone  Assessment/ Plan:  82  y.o. male with type 2 diabetes, coronary disease, chronic kidney disease, chronic diastolic CHF, bilateral lower extremity lymphedema, permanent atrial fibrillation, pulmonary hypertension, history of subdural hematoma  admitted on 11/26/2022 for Acute on chronic diastolic CHF (congestive heart failure) (HCC) [I50.33] Hypervolemia, unspecified hypervolemia type [E87.70]  Volume overload, lower extremity edema in the setting of acute on chronic diastolic CHF exacerbation and significant pulmonary hypertension. -Creatinine now up to 4.2 and urine output only 830 cc over the preceding 24 hours.  Remains quite volume overloaded.  Patient has significant right-sided heart failure with pulmonary hypertension.  Case discussed in depth with cardiology.  Family not yet ready to give up.  They  would like ongoing aggressive care including renal placement therapy.  We have consulted with the intensive care team to place temporary dialysis catheter so that we may initiate CRRT.  Overall prognosis remains guarded however.  Diabetes mellitus type 2 with chronic kidney disease/chronic kidney disease stage IV See discussion above.  We are starting the patient on CRRT with UF target of 2 kg/day.    LOS: 7 Kyle Roberson 9/12/202412:07 PM  [facility] [place], [place] [phone]

## 2022-12-03 NOTE — Progress Notes (Signed)
Patient ID: Kyle Roberson, male   DOB: November 01, 1940, 82 y.o.   MRN: [id number]     Advanced Heart Failure Rounding Note  PCP-Cardiologist: Julien Nordmann, MD   Subjective:    Remains on Lasix gtt 15 mg/hr. Got 5 mg metolazone x1 again yesterday. He remains on milrinone 0.25 mcg/kg/min.  UOP significantly dropped yesterday, only 800 cc out. BUN and creatinine continue to rise, creatinine up to 4.2.   In rate-controlled atrial fibrillation.   Not short of breath at rest, feels better compared to admission.  RHC Procedural Findings (on milrinone 0.25): Hemodynamics (mmHg) RA mean 23 RV 64/24 PA 73/28, mean 45 PCWP mean 23 Oxygen saturations: PA 60% AO 93% Cardiac Output (Fick) 6.32  Cardiac Index (Fick) 2.82 PVR 3.5 WU PAPi 1.96   Objective:   Weight Range: 109 kg Body mass index is 36.54 kg/m.   Vital Signs:   Temp:  [97.3 F (36.3 C)-98.6 F (37 C)] 97.8 F (36.6 C) (09/12 0700) Pulse Rate:  [56-88] 71 (09/12 0700) Resp:  [17-30] 22 (09/12 0700) BP: (108-142)/(41-95) 122/51 (09/12 0700) SpO2:  [90 %-100 %] 96 % (09/12 0700) Weight:  [109 kg] 109 kg (09/12 0500) Last BM Date : 12/01/22  Weight change: Filed Weights   12/01/22 0500 12/02/22 0500 12/03/22 0500  Weight: 112.3 kg 108.9 kg 109 kg   Intake/Output:   Intake/Output Summary (Last 24 hours) at 12/03/2022 0755 Last data filed at 12/03/2022 0718 Gross per 24 hour  Intake 896.13 ml  Output 800 ml  Net 96.13 ml    Physical Exam   General: NAD Neck: JVP 16 cm, no thyromegaly or thyroid nodule.  Lungs: Clear to auscultation bilaterally with normal respiratory effort. CV: Nondisplaced PMI.  Heart irregular S1/S2, no S3/S4, no murmur.  1+ chronic edema to knees.  Abdomen: Soft, nontender, no hepatosplenomegaly, no distention.  Skin: Intact without lesions or rashes.  Neurologic: Alert and oriented x 3.  Psych: Normal affect. Extremities: No clubbing or cyanosis.  HEENT: Normal.   Telemetry    Atrial fibrillation rate 80s (personally reviewed)  Labs    CBC Recent Labs    12/02/22 0316 12/02/22 0935 12/02/22 0939 12/03/22 0220  WBC 8.6  --   --  7.3  NEUTROABS  --   --   --  5.8  HGB 10.0*   < > 10.9* 9.5*  HCT 31.7*   < > 32.0* 30.1*  MCV 96.1  --   --  94.7  PLT 164  --   --  170   < > = values in this interval not displayed.   Basic Metabolic Panel Recent Labs    40/98/11 0316 12/02/22 0935 12/02/22 0939 12/03/22 0220  NA 137   < > 136 134*  K 4.1   < > 4.0 4.1  CL 96*  --   --  94*  CO2 27  --   --  26  GLUCOSE 428*  --   --  332*  BUN 82*  --   --  95*  CREATININE 3.33*  --   --  4.22*  CALCIUM 8.7*  --   --  8.3*  MG 2.5*  --   --  2.6*   < > = values in this interval not displayed.   Liver Function Tests Recent Labs    11/30/22 0923  ALBUMIN 3.2*   No results for input(s): "LIPASE", "AMYLASE" in the last 72 hours. Cardiac Enzymes No results for input(s): "CKTOTAL", "CKMB", "CKMBINDEX", "  TROPONINI" in the last 72 hours.  BNP: BNP (last 3 results) Recent Labs    06/22/22 1613 08/24/22 1559 11/26/22 1555  BNP 171.0* 170.1* 238.6*    ProBNP (last 3 results) No results for input(s): "PROBNP" in the last 8760 hours.   D-Dimer No results for input(s): "DDIMER" in the last 72 hours. Hemoglobin A1C No results for input(s): "HGBA1C" in the last 72 hours. Fasting Lipid Panel No results for input(s): "CHOL", "HDL", "LDLCALC", "TRIG", "CHOLHDL", "LDLDIRECT" in the last 72 hours. Thyroid Function Tests No results for input(s): "TSH", "T4TOTAL", "T3FREE", "THYROIDAB" in the last 72 hours.  Invalid input(s): "FREET3"  Other results:   Imaging   DG Ankle Left Port  Result Date: 12/02/2022 CLINICAL DATA:  Ankle pain EXAM: PORTABLE LEFT ANKLE - 2 VIEW COMPARISON:  None Available. FINDINGS: Vascular and soft tissue calcifications. No fracture or malalignment. Ankle mortise is symmetric. Mild medial degenerative change. Moderate plantar  calcaneal spur IMPRESSION: No acute osseous abnormality. Mild degenerative change. Plantar calcaneal spur. Electronically Signed   By: Jasmine Pang M.D.   On: 12/02/2022 21:51   CARDIAC CATHETERIZATION  Result Date: 12/02/2022 1. Elevated right > left heart filling pressures. 2. Predominantly pulmonary venous hypertension. 3. Normal cardiac output on milrinone. 4. PAPi preserved. Patient has left- and right-sided failure but predominantly right-sided.  This is going to be very difficult to manage with associated cardiorenal syndrome and creatinine in 3's range.    Medications:   Scheduled Medications:  apixaban  2.5 mg Oral BID   Chlorhexidine Gluconate Cloth  6 each Topical Daily   cholecalciferol  2,000 Units Oral Daily   docusate sodium  100 mg Oral BID   doxazosin  1 mg Oral BID   empagliflozin  10 mg Oral Daily   hydrALAZINE  25 mg Oral TID   insulin aspart  0-5 Units Subcutaneous QHS   insulin aspart  0-9 Units Subcutaneous TID WC   isosorbide mononitrate  60 mg Oral Daily   multivitamin with minerals  1 tablet Oral Daily   neomycin-polymyxin b-dexamethasone  1 Application Right Eye TID   omega-3 acid ethyl esters  1 g Oral Daily   rosuvastatin  10 mg Oral Once per day on Monday Wednesday Friday   sodium chloride flush  3 mL Intravenous Q12H    Infusions:  sodium chloride     amiodarone 30 mg/hr (12/03/22 0718)   milrinone 0.2511 mcg/kg/min (12/03/22 0718)    PRN Medications: sodium chloride, acetaminophen **OR** acetaminophen, calamine, ipratropium-albuterol, ondansetron **OR** ondansetron (ZOFRAN) IV, polyethylene glycol, sodium chloride flush, traZODone  Assessment/Plan   1. Acute on chronic diastolic CHF: With prominent RV failure.  Echo this admission with EF 55-60%, mild LVH, moderate RV enlargement with mildly decreased RV systolic function, PASP 73 mmHg, severe LAE, mild-moderate TR, IVC dilated.  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 With  significant volume overload, he was started on milrinone for RV support (no PICC with CKD IV) and diuresed with Lasix gtt.  Initial good response but yesterday UOP fell off significantly (800 cc for day) and creatinine up to 4.2.  RHC yesterday showed both right and left sided failure but RV failure predominated with RA pressure > 20.  This is going to be very difficult to manage with associated cardiorenal syndrome.  - Stop Lasix for now with poor UOP and significant creatinine worsening.  He is still quite volume overloaded => I think that in this situation, a trial of CVVH would be  appropriate to see if gradual decongestion of RV + time leads to some re-setting of renal function.  However, I worry that he is going to end up HD-dependent.  - Continue milrinone 0.25 for RV support.   2. AKI on CKD stage IV: Complicates CHF.  Creatinine 2.98 => 3.07=>3.2 => 3.42 => 3.33 => 4.2.  As above, difficult situation with severe predominantly RV failure/volume overload and cardiorenal syndrome.  Still quite volume overloaded but now UOP falling off despite high doses of diuretics.   - Stop Lasix for now.  - Stop hydralazine/Imdur to promote higher BP.  - Will need discussion with nephrologist about initiation of RRT, would favor trial of CVVH if possible. .   3.  Atrial fibrillation: Permanent. Reasonable rate control.  - He is on apixaban 2.5 bid.  4. CAD: Remote PCI to LAD and diagonal.  No chest pain.  - Continue statin - No ASA with apixaban use.  5. 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   6. 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  He was started on amiodarone gtt, very rare PVCs now.  - Continue amiodarone gtt while on milrinone, will stop when milrinone titrated off.   CRITICAL CARE Performed by: Marca Ancona  Total critical care time: 35 minutes  Critical care time was exclusive of separately billable procedures and treating other patients.  Critical care was  necessary to treat or prevent imminent or life-threatening deterioration.  Critical care was time spent personally by me on the following activities: development of treatment plan with patient and/or surrogate as well as nursing, discussions with consultants, evaluation of patient's response to treatment, examination of patient, obtaining history from patient or surrogate, ordering and performing treatments and interventions, ordering and review of laboratory studies, ordering and review of radiographic studies, pulse oximetry and re-evaluation of patient's condition.   Length of Stay: 7  Marca Ancona, MD  12/03/2022, 7:55 AM  Advanced Heart Failure Team Pager [phone] (M-F; 7a - 5p)  Please contact [facility] for night-coverage after hours (5p -7a ) and weekends on [email or web address]

## 2022-12-04 DIAGNOSIS — L8990 Pressure ulcer of unspecified site, unspecified stage: Secondary | ICD-10-CM | POA: Insufficient documentation

## 2022-12-04 DIAGNOSIS — E1122 Type 2 diabetes mellitus with diabetic chronic kidney disease: Secondary | ICD-10-CM | POA: Diagnosis not present

## 2022-12-04 DIAGNOSIS — I1310 Hypertensive heart and chronic kidney disease without heart failure, with stage 1 through stage 4 chronic kidney disease, or unspecified chronic kidney disease: Secondary | ICD-10-CM

## 2022-12-04 DIAGNOSIS — I132 Hypertensive heart and chronic kidney disease with heart failure and with stage 5 chronic kidney disease, or end stage renal disease: Secondary | ICD-10-CM | POA: Diagnosis not present

## 2022-12-04 DIAGNOSIS — R570 Cardiogenic shock: Secondary | ICD-10-CM

## 2022-12-04 DIAGNOSIS — I5033 Acute on chronic diastolic (congestive) heart failure: Secondary | ICD-10-CM | POA: Diagnosis not present

## 2022-12-04 DIAGNOSIS — Z515 Encounter for palliative care: Secondary | ICD-10-CM | POA: Diagnosis not present

## 2022-12-04 LAB — RENAL FUNCTION PANEL
Albumin: 3.3 g/dL — ABNORMAL LOW (ref 3.5–5.0)
Albumin: 3.1 g/dL — ABNORMAL LOW (ref 3.5–5.0)
Anion gap: 11 (ref 5–15)
Anion gap: 12 (ref 5–15)
BUN: 64 mg/dL — ABNORMAL HIGH (ref 8–23)
BUN: 50 mg/dL — ABNORMAL HIGH (ref 8–23)
CO2: 26 mmol/L (ref 22–32)
CO2: 25 mmol/L (ref 22–32)
Calcium: 8.1 mg/dL — ABNORMAL LOW (ref 8.9–10.3)
Calcium: 7.6 mg/dL — ABNORMAL LOW (ref 8.9–10.3)
Chloride: 98 mmol/L (ref 98–111)
Chloride: 93 mmol/L — ABNORMAL LOW (ref 98–111)
Creatinine, Ser: 2.79 mg/dL — ABNORMAL HIGH (ref 0.61–1.24)
Creatinine, Ser: 2.15 mg/dL — ABNORMAL HIGH (ref 0.61–1.24)
GFR, Estimated: 22 mL/min — ABNORMAL LOW (ref >60)
GFR, Estimated: 30 mL/min — ABNORMAL LOW (ref >60)
Glucose, Bld: 213 mg/dL — ABNORMAL HIGH (ref 70–99)
Glucose, Bld: 385 mg/dL — ABNORMAL HIGH (ref 70–99)
Phosphorus: 3.1 mg/dL (ref 2.5–4.6)
Phosphorus: 2.3 mg/dL — ABNORMAL LOW (ref 2.5–4.6)
Potassium: 4 mmol/L (ref 3.5–5.1)
Potassium: 3.8 mmol/L (ref 3.5–5.1)
Sodium: 135 mmol/L (ref 135–145)
Sodium: 130 mmol/L — ABNORMAL LOW (ref 135–145)

## 2022-12-04 LAB — APTT
aPTT: 120 s — ABNORMAL HIGH (ref 24–36)
aPTT: 106 s — ABNORMAL HIGH (ref 24–36)

## 2022-12-04 LAB — GLUCOSE, CAPILLARY
Glucose-Capillary: 202 mg/dL — ABNORMAL HIGH (ref 70–99)
Glucose-Capillary: 211 mg/dL — ABNORMAL HIGH (ref 70–99)
Glucose-Capillary: 214 mg/dL — ABNORMAL HIGH (ref 70–99)
Glucose-Capillary: 226 mg/dL — ABNORMAL HIGH (ref 70–99)
Glucose-Capillary: 280 mg/dL — ABNORMAL HIGH (ref 70–99)

## 2022-12-04 LAB — CBC
HCT: 29.1 % — ABNORMAL LOW (ref 39.0–52.0)
Hemoglobin: 9.2 g/dL — ABNORMAL LOW (ref 13.0–17.0)
MCH: 29.4 pg (ref 26.0–34.0)
MCHC: 31.6 g/dL (ref 30.0–36.0)
MCV: 93 fL (ref 80.0–100.0)
Platelets: 169 10*3/uL (ref 150–400)
RBC: 3.13 MIL/uL — ABNORMAL LOW (ref 4.22–5.81)
RDW: 13.9 % (ref 11.5–15.5)
WBC: 6.7 10*3/uL (ref 4.0–10.5)
nRBC: 0 % (ref 0.0–0.2)

## 2022-12-04 LAB — MAGNESIUM: Magnesium: 2.3 mg/dL (ref 1.7–2.4)

## 2022-12-04 LAB — HEPARIN LEVEL (UNFRACTIONATED): Heparin Unfractionated: >1.1 [IU]/mL — ABNORMAL HIGH (ref 0.30–0.70)

## 2022-12-04 MED ORDER — OMEGA-3-ACID ETHYL ESTERS 1 G PO CAPS
1.0000 g | ORAL_CAPSULE | Freq: Every day | ORAL | Status: DC
  Administered 2022-12-05 – 2023-01-04 (×26): 1 g via ORAL
  Filled 2022-12-04 (×26): qty 1

## 2022-12-04 MED ORDER — ACETAMINOPHEN 650 MG RE SUPP: 650.0000 mg | Freq: Four times a day (QID) | RECTAL | Status: DC | PRN

## 2022-12-04 MED ORDER — POLYETHYLENE GLYCOL 3350 17 G PO PACK: 17.0000 g | PACK | Freq: Every day | ORAL | Status: DC | PRN

## 2022-12-04 MED ORDER — PROSOURCE PLUS PO LIQD
30.0000 mL | Freq: Two times a day (BID) | ORAL | Status: DC
  Administered 2022-12-07 – 2022-12-08 (×2): 30 mL via ORAL
  Filled 2022-12-04 (×7): qty 30

## 2022-12-04 MED ORDER — ROSUVASTATIN CALCIUM 10 MG PO TABS: 10.0000 mg | ORAL_TABLET | ORAL | Status: DC

## 2022-12-04 MED ORDER — OMEGA-3-ACID ETHYL ESTERS 1 G PO CAPS: 1.0000 g | ORAL_CAPSULE | Freq: Every day | ORAL | Status: DC

## 2022-12-04 MED ORDER — RENA-VITE PO TABS
1.0000 | ORAL_TABLET | Freq: Every day | ORAL | Status: DC
  Administered 2022-12-05 – 2023-01-03 (×29): 1 via ORAL
  Filled 2022-12-04 (×31): qty 1

## 2022-12-04 MED ORDER — DOCUSATE SODIUM 50 MG/5ML PO LIQD: 100.0000 mg | Freq: Two times a day (BID) | ORAL | Status: DC

## 2022-12-04 MED ORDER — RENA-VITE PO TABS
1.0000 | ORAL_TABLET | Freq: Every day | ORAL | Status: DC
  Administered 2022-12-04: 1
  Filled 2022-12-04: qty 1

## 2022-12-04 MED ORDER — ACETAMINOPHEN 325 MG PO TABS: 650.0000 mg | ORAL_TABLET | Freq: Four times a day (QID) | ORAL | Status: DC | PRN

## 2022-12-04 MED ORDER — NEPRO/CARBSTEADY PO LIQD: 237.0000 mL | Freq: Three times a day (TID) | ORAL | Status: DC

## 2022-12-04 MED ORDER — VITAMIN C 500 MG PO TABS
500.0000 mg | ORAL_TABLET | Freq: Two times a day (BID) | ORAL | Status: DC
  Administered 2022-12-04: 500 mg via ORAL
  Filled 2022-12-04: qty 1

## 2022-12-04 MED ORDER — ROSUVASTATIN CALCIUM 10 MG PO TABS
10.0000 mg | ORAL_TABLET | ORAL | Status: DC
  Administered 2022-12-07 – 2023-01-04 (×11): 10 mg via ORAL
  Filled 2022-12-04 (×13): qty 1

## 2022-12-04 MED ORDER — TRAZODONE HCL 50 MG PO TABS
25.0000 mg | ORAL_TABLET | Freq: Every evening | ORAL | Status: DC | PRN
  Administered 2022-12-21 – 2022-12-27 (×4): 25 mg via ORAL
  Filled 2022-12-04 (×8): qty 1

## 2022-12-04 MED ORDER — VITAMIN C 500 MG PO TABS
500.0000 mg | ORAL_TABLET | Freq: Two times a day (BID) | ORAL | Status: DC
  Administered 2022-12-05 – 2023-01-04 (×54): 500 mg via ORAL
  Filled 2022-12-04 (×52): qty 1

## 2022-12-04 MED ORDER — ACETAMINOPHEN 325 MG PO TABS
650.0000 mg | ORAL_TABLET | Freq: Four times a day (QID) | ORAL | Status: DC | PRN
  Administered 2022-12-09 – 2022-12-27 (×8): 650 mg via ORAL
  Filled 2022-12-04 (×9): qty 2

## 2022-12-04 MED ORDER — NEPRO/CARBSTEADY PO LIQD
237.0000 mL | Freq: Three times a day (TID) | ORAL | Status: DC
  Administered 2022-12-06 – 2022-12-07 (×2): 237 mL

## 2022-12-04 MED ORDER — OXYCODONE HCL 5 MG PO TABS: 5.0000 mg | ORAL_TABLET | Freq: Four times a day (QID) | ORAL | Status: DC | PRN

## 2022-12-04 MED ORDER — OXYCODONE HCL 5 MG PO TABS
5.0000 mg | ORAL_TABLET | Freq: Four times a day (QID) | ORAL | Status: DC | PRN
  Administered 2022-12-10 – 2022-12-22 (×6): 5 mg via ORAL
  Filled 2022-12-04 (×7): qty 1

## 2022-12-04 MED ORDER — OXYCODONE HCL 5 MG PO TABS
5.0000 mg | ORAL_TABLET | Freq: Four times a day (QID) | ORAL | Status: DC | PRN
  Filled 2022-12-04: qty 1

## 2022-12-04 MED ORDER — TRAZODONE HCL 50 MG PO TABS: 25.0000 mg | ORAL_TABLET | Freq: Every evening | ORAL | Status: DC | PRN

## 2022-12-04 MED ORDER — VITAMIN C 500 MG PO TABS: 500.0000 mg | ORAL_TABLET | Freq: Two times a day (BID) | ORAL | Status: DC

## 2022-12-04 NOTE — Progress Notes (Signed)
Patient A/O x4. Monitoring pressures. Discussed EKG, rhythms and frequent PVC's with cardiology. CVP 16. Patient on CRRT, tolerating well. Increased to -100, tolerated well.   Patient refused to turn for skin check, unable to assess injury. Turns and offloading encouraged to patient for injury.   Patient refused multiple request to bladder scan. Education provided. MD aware.  Patient BG continue to run high. MD made aware.  Patient mobility encouraged.   Family at bedside. Updated.

## 2022-12-04 NOTE — Plan of Care (Signed)
  Problem: Education: Goal: Ability to describe self-care measures that may prevent or decrease complications (Diabetes Survival Skills Education) will improve Outcome: Progressing   Problem: Coping: Goal: Ability to adjust to condition or change in health will improve Outcome: Progressing   Problem: Fluid Volume: Goal: Ability to maintain a balanced intake and output will improve Outcome: Progressing   Problem: Nutritional: Goal: Maintenance of adequate nutrition will improve Outcome: Progressing   Problem: Skin Integrity: Goal: Risk for impaired skin integrity will decrease Outcome: Progressing   Problem: Tissue Perfusion: Goal: Adequacy of tissue perfusion will improve Outcome: Progressing

## 2022-12-04 NOTE — Consult Note (Signed)
PHARMACY CONSULT NOTE - ELECTROLYTES  Pharmacy Consult for Electrolyte Monitoring and Replacement   Recent Labs: Potassium (mmol/L)  Date Value  12/04/2022 4.0   Magnesium (mg/dL)  Date Value  91/47/8295 2.3   Calcium (mg/dL)  Date Value  62/13/0865 8.1 (L)   Albumin (g/dL)  Date Value  78/46/9629 3.3 (L)  03/22/2019 4.0   Phosphorus (mg/dL)  Date Value  52/84/1324 3.1   Sodium (mmol/L)  Date Value  12/04/2022 135  07/16/2020 140    Height: 5\' 8"  (172.7 cm) Weight: 108.6 kg (239 lb 6.7 oz) IBW/kg (Calculated) : 68.4 Estimated Creatinine Clearance: 24 mL/min (A) (by C-G formula based on SCr of 2.79 mg/dL (H)).  Assessment  Kyle Roberson is a 82 y.o. male presenting with acute on chronic CHF. PMH significant for CAD, HFpEF, DM, PAH, Afib, SDH, CKD stage IV. Pharmacy has been consulted to monitor and replace electrolytes.  Patient started on CRRT 9/12  Diet: Sodium restricted MIVF: N/A Pertinent medications: Milrinone, amiodarone  Goal of Therapy: Electrolytes within normal limits  Plan:  No electrolyte replacement indicated at this time Renal function panel BID while on CRRT  Thank you for allowing pharmacy to be a part of this patient's care.  Tressie Ellis 12/04/2022 7:54 AM

## 2022-12-04 NOTE — Progress Notes (Signed)
PT Cancellation Note  Patient Details Name: Kyle Roberson MRN: [id number] DOB: [birth year]   Cancelled Treatment:    Reason Eval/Treat Not Completed: Other (comment). Pt with CRRT, PT to hold and re-attempt as able.   Olga Coaster PT, DPT 2:33 PM,12/04/22

## 2022-12-04 NOTE — Consult Note (Signed)
Consultation Note Date: 12/04/2022 at 1130  Patient Name: Kyle Roberson  DOB: 1940/12/30  MRN: [id number]  Age / Sex: 82 y.o., male  PCP: [name], MD Referring Physician: Raechel Chute, MD  HPI/Patient Profile: 82 y.o. male  with past medical history of CAD, CHF, HF PEF, PAH, type 2 diabetes, A-fib, SDH, CKD (stage IV), and bilateral lower extremity lymphedema admitted on 11/26/2022 with weight gain, increased lower extremity edema, and shortness of breath.  9/11 patient had right heart catheterization.  Patient being treated for acute on chronic diastolic CHF, AKI on CKD stage IV, pulmonary hypertension, and cardiorenal syndrome.   Temporary dialysis catheter placed for CRRT with UF initiated.   Clinical Assessment and Goals of Care: Extensive chart review completed prior to meeting patient including labs, vital signs, imaging, progress notes, orders, and available advanced directive documents from current and previous encounters. I then met with patient and his wife at bedside to discuss diagnosis prognosis, GOC, EOL wishes, disposition and options.  I introduced Palliative Medicine as specialized medical care for people living with serious illness. It focuses on providing relief from the symptoms and stress of a serious illness. The goal is to improve quality of life for both the patient and the family.  We discussed a brief life review of the patient.  Patient and wife are newly married but have been together for 10+ years.  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  We discussed patient's current illness and what it means in the larger context of patient's on-going co-morbidities.  Education provided on amiodarone, milrinone, CRRT, potential for HD, and cardiorenal syndrome.   I attempted to elicit values and goals of care important to  the patient.  Patient's wife remains hopeful that patient can return home and, if needed, participate in outpatient hemodialysis.  When asked how the patient feels about these decisions, he says "I guess so".  Patient's wife shares she is hopeful to get as many days with him as possible.  She shares she has a healthy understanding of his current medical situation and is remaining hopeful for improvement.  DNR with limited interventions remains.  Time for outcomes needed.  PMT will continue to follow and support patient and family throughout his hospitalization.  I am off service until Tuesday and plan to f/u with them at that time.  Family made aware that there is a in person PMT provider over the weekend should they have any acute palliative needs.  Primary Decision Maker PATIENT  Physical Exam Vitals reviewed.  Constitutional:      General: He is not in acute distress.    Appearance: He is obese.  HENT:     Head: Normocephalic.     Mouth/Throat:     Mouth: Mucous membranes are moist.  Eyes:     Pupils: Pupils are equal, round, and reactive to light.  Cardiovascular:     Rate and Rhythm: Normal rate.  Pulmonary:     Effort:  Pulmonary effort is normal.  Musculoskeletal:     Comments: Generalized weakness  Skin:    General: Skin is warm and dry.  Neurological:     Mental Status: He is alert and oriented to person, place, and time.  Psychiatric:        Mood and Affect: Mood normal.        Behavior: Behavior normal.        Thought Content: Thought content normal.        Judgment: Judgment normal.     Palliative Assessment/Data: 40%     Thank you for this consult. Palliative medicine will continue to follow and assist holistically.   Time Total: 75 minutes  Signed by: Georgiann Cocker, DNP, FNP-BC Palliative Medicine    Please contact Palliative Medicine Team phone at [phone] for questions and concerns.  For individual provider: See  Loretha Stapler

## 2022-12-04 NOTE — Progress Notes (Signed)
Patient ID: Kyle Roberson, male   DOB: 1940/07/25, 82 y.o.   MRN: [id number]     Advanced Heart Failure Rounding Note  PCP-Cardiologist: Julien Nordmann, MD   Subjective:    Patient was started on CVVH due to worsening renal function and intractable volume overload.  Net negative 1118 cc with 300 cc UOP, currently pulling UF around 75 cc/hr net negative. CVP 16 this morning.   In rate-controlled atrial fibrillation. Overnight had a wider complex rhythm with rate in 60s.   He is more interactive today, denies dyspnea.   RHC Procedural Findings (on milrinone 0.25): Hemodynamics (mmHg) RA mean 23 RV 64/24 PA 73/28, mean 45 PCWP mean 23 Oxygen saturations: PA 60% AO 93% Cardiac Output (Fick) 6.32  Cardiac Index (Fick) 2.82 PVR 3.5 WU PAPi 1.96   Objective:   Weight Range: 108.6 kg Body mass index is 36.4 kg/m.   Vital Signs:   Temp:  [96.4 F (35.8 C)-98.3 F (36.8 C)] 96.4 F (35.8 C) (09/13 0600) Pulse Rate:  [58-96] 66 (09/13 0700) Resp:  [16-31] 31 (09/13 0700) BP: (107-145)/(45-77) 117/56 (09/13 0700) SpO2:  [93 %-100 %] 94 % (09/13 0700) FiO2 (%):  [24 %] 24 % (09/13 0000) Weight:  [108.6 kg] 108.6 kg (09/13 0156) Last BM Date : 12/01/22  Weight change: Filed Weights   12/02/22 0500 12/03/22 0500 12/04/22 0156  Weight: 108.9 kg 109 kg 108.6 kg   Intake/Output:   Intake/Output Summary (Last 24 hours) at 12/04/2022 0759 Last data filed at 12/04/2022 0700 Gross per 24 hour  Intake 988 ml  Output 2217 ml  Net -1229 ml    Physical Exam   General: NAD Neck: JVP 14-16, no thyromegaly or thyroid nodule.  Lungs: Clear to auscultation bilaterally with normal respiratory effort. CV: Nondisplaced PMI.  Heart irregular S1/S2, no S3/S4, no murmur.  1+ chronic edema to thighs.  Abdomen: Soft, nontender, no hepatosplenomegaly, no distention.  Skin: Intact without lesions or rashes.  Neurologic: Alert and oriented x 3.  Psych: Normal affect. Extremities: No  clubbing or cyanosis.  HEENT: Normal.   Telemetry   Atrial fibrillation rate 70s, had run of wider complex rhythm in 60s earlier (personally reviewed)  Labs    CBC Recent Labs    12/03/22 0220 12/04/22 0427  WBC 7.3 6.7  NEUTROABS 5.8  --   HGB 9.5* 9.2*  HCT 30.1* 29.1*  MCV 94.7 93.0  PLT 170 169   Basic Metabolic Panel Recent Labs    40/98/11 0220 12/03/22 1645 12/04/22 0427  NA 134* 134* 135  K 4.1 4.0 4.0  CL 94* 94* 98  CO2 26 26 26   GLUCOSE 332* 343* 213*  BUN 95* 93* 64*  CREATININE 4.22* 4.26* 2.79*  CALCIUM 8.3* 8.1* 8.1*  MG 2.6*  --  2.3  PHOS  --  4.8* 3.1   Liver Function Tests Recent Labs    12/03/22 1645 12/04/22 0427  ALBUMIN 3.3* 3.3*   No results for input(s): "LIPASE", "AMYLASE" in the last 72 hours. Cardiac Enzymes No results for input(s): "CKTOTAL", "CKMB", "CKMBINDEX", "TROPONINI" in the last 72 hours.  BNP: BNP (last 3 results) Recent Labs    06/22/22 1613 08/24/22 1559 11/26/22 1555  BNP 171.0* 170.1* 238.6*    ProBNP (last 3 results) No results for input(s): "PROBNP" in the last 8760 hours.   D-Dimer No results for input(s): "DDIMER" in the last 72 hours. Hemoglobin A1C No results for input(s): "HGBA1C" in the last 72 hours. Fasting Lipid  Panel No results for input(s): "CHOL", "HDL", "LDLCALC", "TRIG", "CHOLHDL", "LDLDIRECT" in the last 72 hours. Thyroid Function Tests No results for input(s): "TSH", "T4TOTAL", "T3FREE", "THYROIDAB" in the last 72 hours.  Invalid input(s): "FREET3"  Other results:   Imaging   DG Chest Port 1 View  Result Date: 12/03/2022 CLINICAL DATA:  [id number] Encounter for central line placement [id number] EXAM: PORTABLE CHEST 1 VIEW COMPARISON:  CXR 11/28/22 FINDINGS: Cardiomegaly. Small bilateral pleural effusions. There are prominent bilateral interstitial opacities could represent pulmonary venous congestion or mild pulmonary edema. Left-sided central venous catheter with tip in the upper SVC.  No pneumothorax. Surrounding IMPRESSION: 1. Left-sided central venous catheter with tip in the upper SVC. No pneumothorax. 2. Cardiomegaly with small bilateral pleural effusions and mild pulmonary edema, unchanged. Electronically Signed   By: Lorenza Cambridge M.D.   On: 12/03/2022 15:01    Medications:   Scheduled Medications:  Chlorhexidine Gluconate Cloth  6 each Topical Daily   cholecalciferol  2,000 Units Oral Daily   docusate sodium  100 mg Oral BID   doxazosin  1 mg Oral BID   empagliflozin  10 mg Oral Daily   insulin aspart  0-15 Units Subcutaneous TID AC & HS   insulin aspart  3 Units Subcutaneous TID WC   multivitamin with minerals  1 tablet Oral Daily   neomycin-polymyxin b-dexamethasone  1 Application Right Eye TID   omega-3 acid ethyl esters  1 g Oral Daily   rosuvastatin  10 mg Oral Once per day on Monday Wednesday Friday   sodium chloride flush  3 mL Intravenous Q12H    Infusions:   prismasol BGK 4/2.5 400 mL/hr at 12/04/22 0200    prismasol BGK 4/2.5 400 mL/hr at 12/04/22 0159   sodium chloride     amiodarone Stopped (12/04/22 0659)   heparin 1,300 Units/hr (12/04/22 0700)   milrinone Stopped (12/04/22 0659)   prismasol BGK 4/2.5 1,500 mL/hr at 12/04/22 0651    PRN Medications: sodium chloride, acetaminophen **OR** acetaminophen, calamine, fentaNYL (SUBLIMAZE) injection, heparin, ipratropium-albuterol, ondansetron **OR** ondansetron (ZOFRAN) IV, polyethylene glycol, sodium chloride flush, traZODone  Assessment/Plan   1. Acute on chronic diastolic CHF: With prominent RV failure.  Echo this admission with EF 55-60%, mild LVH, moderate RV enlargement with mildly decreased RV systolic function, PASP 73 mmHg, severe LAE, mild-moderate TR, IVC dilated.  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 With significant volume overload, he was started on milrinone for RV support (no PICC with CKD IV) and diuresed with Lasix gtt.  Initial good response but UOP fell off  significantly and creatinine up to 4.2, started CVVH on 9/12.  RHC 9/11 showed both right and left sided failure but RV failure predominated with RA pressure > 20.  This is going to be very difficult to manage with associated cardiorenal syndrome.  Patient is currently on CVVH pulling around 75 cc/hr net negative, tolerating well.  CVP 16 today.   - Continue trial of CVVH to see if gradual decongestion of RV + time leads to some re-setting of renal function.  However, I worry that he is going to end up HD-dependent. Will pull net negative 100-125 cc/hr UF today if nephrology in agreement.  Goal CVP </= 10.  - On milrinone for RV support, decrease to 0.125.  2. AKI on CKD stage IV: Complicates CHF.  Creatinine 2.98 => 3.07=>3.2 => 3.42 => 3.33 => 4.2.  As above, difficult situation with severe predominantly RV failure/volume overload and cardiorenal syndrome.  Started on CVVH on 9/12 with UOP falling off despite high doses of diuretics.  Currently doing well on CVVH, BP stable.  He is much more alert and interactive today.  - Off hydralazine/Imdur to promote higher BP.  - Continue CVVH until CVP </= 10 then hold and follow for renal recovery.  I worry that he may be HD-dependent at this point.    3.  Atrial fibrillation: Permanent. Reasonable rate control.  - He is on apixaban 2.5 bid.  4. CAD: Remote PCI to LAD and diagonal.  No chest pain.  - Continue statin - No ASA with apixaban use.  5. 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   6. 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  He was started on amiodarone gtt, very rare PVCs now.  - Continue amiodarone gtt while on milrinone, will stop when milrinone titrated off.   CRITICAL CARE Performed by: Marca Ancona  Total critical care time: 35 minutes  Critical care time was exclusive of separately billable procedures and treating other patients.  Critical care was necessary to treat or prevent imminent or life-threatening  deterioration.  Critical care was time spent personally by me on the following activities: development of treatment plan with patient and/or surrogate as well as nursing, discussions with consultants, evaluation of patient's response to treatment, examination of patient, obtaining history from patient or surrogate, ordering and performing treatments and interventions, ordering and review of laboratory studies, ordering and review of radiographic studies, pulse oximetry and re-evaluation of patient's condition.   Length of Stay: 8  Marca Ancona, MD  12/04/2022, 7:59 AM  Advanced Heart Failure Team Pager [phone] (M-F; 7a - 5p)  Please contact [facility] for night-coverage after hours (5p -7a ) and weekends on [email or web address]

## 2022-12-04 NOTE — Consult Note (Signed)
ANTICOAGULATION CONSULT NOTE  Pharmacy Consult for IV Heparin Indication: atrial fibrillation  Patient Measurements: Height: 5\' 8"  (172.7 cm) Weight: 108.6 kg (239 lb 6.7 oz) IBW/kg (Calculated) : 68.4 Heparin Dosing Weight: 93 kg  Labs: Recent Labs    12/02/22 0316 12/02/22 0935 12/02/22 0939 12/03/22 0220 12/03/22 1439 12/03/22 1645 12/04/22 0427 12/04/22 0659  HGB 10.0*   < > 10.9* 9.5*  --   --  9.2*  --   HCT 31.7*   < > 32.0* 30.1*  --   --  29.1*  --   PLT 164  --   --  170  --   --  169  --   APTT  --   --   --   --  52*  --   --  120*  LABPROT  --   --   --   --  18.9*  --   --   --   INR  --   --   --   --  1.6*  --   --   --   HEPARINUNFRC  --   --   --   --   --   --   --  >1.10*  CREATININE 3.33*  --   --  4.22*  --  4.26* 2.79*  --    < > = values in this interval not displayed.    Estimated Creatinine Clearance: 24 mL/min (A) (by C-G formula based on SCr of 2.79 mg/dL (H)).   Medical History: Past Medical History:  Diagnosis Date   AKI (acute kidney injury) (HCC) 07/06/2018   CAD (coronary artery disease)    a. Remote PCI/stenting to LAD w PTCA Diagnoal. RCA 60%; b.  2005/2008 Cardiolites w/ reportedly mild ischemia in Diag territory-->Med rx.   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 04/2019 Echo: EF 55-60%, no rwma, nl RV fxn, RVSP 55.22mmHg. Mod dil LA. Mild-mod MR. Mod dil PA.   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 04/2019 Echo: RVSP 55.58mmHg.   Permanent atrial fibrillation (HCC)    a. CHA2DS2VASc = 5-->dose adjusted eliquis (age/creat).   Subdural hematoma (HCC)    a. 01/2021 in setting of fall s/p L frontotemporal craniotomy.    Medications:   Apixaban 2.5 mg BID (last dose 9/12 at 1100)  Assessment: 82 y/o M with PMH as above admitted with acute on chronic diastolic CHF / RV failure complicated by CKD / cardiorenal syndrome. Patient is now being started on CRRT. Pharmacy consulted to transition apixaban to heparin for now in case further procedures required.  Baseline aPTT 52 (elevated) and INR 1.6 (elevated). Baseline CBC notable for stable anemia.  0913 0659 aPTT 120, suprather; 1300 un/hr  Goal of Therapy:  Heparin level 0.3-0.7 units/ml aPTT 66 - 102 seconds Monitor platelets by anticoagulation protocol: Yes   Plan:  --aPTT is supratherapeutic. Decrease heparin infusion to 1100 units/hr --Re-check aPTT 8 hours from rate change Follow aPTT until correlation established with HL --Daily CBC per protocol while on IV heparin  Tressie Ellis 12/04/2022,7:50 AM

## 2022-12-04 NOTE — Progress Notes (Signed)
Initial Nutrition Assessment  DOCUMENTATION CODES:   Obesity unspecified  INTERVENTION:   Nepro Shake po TID, each supplement provides 425 kcal and 19 grams protein  Magic cup TID with meals, each supplement provides 290 kcal and 9 grams of protein  Pro-Source Plus 30ml po BID- Each supplement provides 100kcal and 15g protein   Rena-vit po daily   Vitamin C 500mg  po BID  Pt at high refeed risk; recommend monitor potassium, magnesium and phosphorus labs daily until stable  Daily weights   NUTRITION DIAGNOSIS:   Increased nutrient needs related to acute illness (CRRT) as evidenced by estimated needs.  GOAL:   Patient will meet greater than or equal to 90% of their needs  MONITOR:   PO intake, Supplement acceptance, Labs, Weight trends, I & O's, Skin  REASON FOR ASSESSMENT:   Consult Assessment of nutrition requirement/status  ASSESSMENT:   82 y/o male with h/o CHF, DM, CKD IV, HTN, CAD, BPH, GERD, SDH s/p craniotomy, hernia s/p repair, Afib, lymphaedema, HLD, depression and pulmonary hypertension who is admitted with AKI and volume overload requiring CRRT.  Met with pt and pt's wife in room today. Pt reports that he is feeling rough. Wife at bedside reports pt with good appetite and oral intake at baseline but reports that pt's oral intake has been decreased for the past several days. Pt reports that his appetite is improved today. Pt ate 100% of his breakfast this morning. RD discussed with pt the importance of adequate nutrition needed to preserve lean muscle and replace losses from CRRT. Pt is reluctant to drink supplements but reports he is willing to try them. RD will add supplements and vitamins to help pt meet his estimated needs. Pt is likely at refeed risk. Pt reports that his UBW is ~220lbs. Per chart, pt last weighed ~220lbs in December. Pt's more recent weights in chart are noted to be around 230lbs. Pt reports that he has been gradually gaining fluid weight for  months. Pt follows a strict low sodium diet at home per wife report.   Medications reviewed and include: D3, colace, insulin, lovaza, heparin   Labs reviewed: K 4.0 wnl, BUN 64(H), creat 2.79(H), P 3.1 wnl, Mg 2.3 wnl Hgb 9.2(L), Hct 29.1(L) Cbgs- 226, 211, 202 x 24 hrs  AIC 7.3(H)- 4/1  NUTRITION - FOCUSED PHYSICAL EXAM:  Flowsheet Row Most Recent Value  Orbital Region No depletion  Upper Arm Region No depletion  Thoracic and Lumbar Region No depletion  Buccal Region No depletion  Temple Region Mild depletion  Clavicle Bone Region Mild depletion  Clavicle and Acromion Bone Region Mild depletion  Scapular Bone Region Mild depletion  Dorsal Hand Mild depletion  Patellar Region Unable to assess  Anterior Thigh Region No depletion  Posterior Calf Region Unable to assess  Edema (RD Assessment) Moderate  Hair Reviewed  Eyes Reviewed  Mouth Reviewed  Skin Reviewed  Nails Reviewed   Diet Order:   Diet Order             Diet 2 gram sodium Room service appropriate? Yes; Fluid consistency: Thin  Diet effective now                  EDUCATION NEEDS:   Education needs have been addressed  Skin:  Skin Assessment: Reviewed RN Assessment (Stage I buttocks, ecchymosis)  Last BM:  9/10  Height:   Ht Readings from Last 1 Encounters:  11/26/22 5\' 8"  (1.727 m)    Weight:   Wt  Readings from Last 1 Encounters:  12/04/22 108.6 kg    Ideal Body Weight:  70 kg  BMI:  Body mass index is 36.4 kg/m.  Estimated Nutritional Needs:   Kcal:  2000-2300kcal/day  Protein:  100-115g/day  Fluid:  UOP +1L  Betsey Holiday MS, RD, LDN Please refer to [facility] for RD and/or RD on-call/weekend/after hours pager

## 2022-12-04 NOTE — Progress Notes (Signed)
NAME:  Kyle Roberson, MRN:  [id number], DOB:  1940-09-25, LOS: 8 ADMISSION DATE:  11/26/2022, CONSULTATION DATE:  12/03/2022 REFERRING MD:  Dr. Cherylann Ratel, CHIEF COMPLAINT:  Volume Overload   Brief Pt Description / Synopsis:  82 y.o Male with PMHx significant for CKD Stage IV, HFpEF, A. Fib on Eliquis, PAH, SDH, and BLE lymphedema who presented with volume overload in the setting of Acute on Chronic HFpEF and progressive Cardiorenal Syndrome requiring inotropes and Lasix gtt. Ultimately requiring initiation of CRRT.  History of Present Illness:  Kyle Roberson is a 82 year old male with a past medical history significant for CKD stage V, HFpEF, permanent A-fib on Eliquis, pulmonary artery hypertension, hypertension, CAD, hyperlipidemia, bilateral lower extremity lymphedema, and subdural hematoma who presented to [facility] ED on 11/26/2022 from his outpatient nephrologist office due to increasing weight gain, bilateral lower extremity edema and shortness of breath.  His baseline weight is around 220 pounds, and at his nephrology appointment was found to be 255 pounds.  He denied chest pain, cough, fever.  His wife reported that over the last 6-week he has slowly been gaining weight and no longer responding to furosicx (Prior he had been having about a 1 to 2 pound weight loss with furosicx).  They reported he had been adherent to low-sodium diet and watching his fluid intake.  ED Course: Initial Vital Signs: Temperature 97.7 F orally, pulse 64, respiratory 20, blood pressure 174/68, SpO2 96% Significant Labs: Glucose 124, BUN 59, creatinine 2.85, BNP 238, hemoglobin 10.9, hematocrit 34 Imaging Chest X-ray 9/7>>IMPRESSION: 1. Moderate mixed pattern pulmonary edema. 2. Enlarged cardiomediastinal silhouette. Medications Administered: 60 mg IV Lasix x 1 dose  He was admitted by the hospitalist for further workup and treatment.  He was started on Lasix drip.  Advanced heart failure and nephrology were  consulted.  Please see "significant hospital events" section below for full detailed hospital course.  Pertinent  Medical History   Past Medical History:  Diagnosis Date   AKI (acute kidney injury) (HCC) 07/06/2018   CAD (coronary artery disease)    a. Remote PCI/stenting to LAD w PTCA Diagnoal. RCA 60%; b.  2005/2008 Cardiolites w/ reportedly mild ischemia in Diag territory-->Med rx.   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 04/2019 Echo: EF 55-60%, no rwma, nl RV fxn, RVSP 55.6mmHg. Mod dil LA. Mild-mod MR. Mod dil PA.   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 04/2019 Echo: RVSP 55.77mmHg.   Permanent atrial fibrillation (HCC)    a. CHA2DS2VASc = 5-->dose adjusted eliquis (age/creat).   Subdural hematoma (HCC)    a. 01/2021 in setting of fall s/p L frontotemporal craniotomy.    Micro Data:  9/9: MRSA PCR>>negative  Antimicrobials:   Anti-infectives (From admission, onward)    None       Significant Hospital Events: Including procedures, antibiotic start and stop dates in addition to other pertinent events   9/5: Presented to ED from nephrology office, admitted by hospitalist.  Advanced heart failure and nephrology consulted.  Started on Lasix drip. 9/6Peri Jefferson UOP with Lasix drip. 9/7: UOP 2.5 L, renal function stable.  Lasix drip increased to 8 mg/hr. 9/8: UOP 3l, lasix gtt continued.  9/9: Creatinine worsened, lasix gtt increassed to 15 mg/hr.  Started on empiric low dose milrione 0.125 for RV support. Plan for Right Heart Cath on Wednesday to assess CO. 9/10: UOP 2.5 L, pt fagitue and respiratory status some improved. Lasix gtt continued, Milrinone increased to 0.25. 9/11:  Started on  Amiodarone gtt due to frequent PVC's on Milrinone.  Underwent Right Heart Cath 9/12: UOP decreased to only 800 cc, BUN and Creatinine worsened.  Lasix discontinued.  Plan to start CRRT, PCCM asked to place temporary HD catheter and to consult.  Code status changed to DNR/DNI. 9/13: Tolerating CRRT with UF 75 ml/hr, not requiring vasopressors. Tentative plan to increase UF to 100-125 ml/hr  Interim History / Subjective:  -No significant events noted overnight -Afebrile, on Milrinone and Amiodarone infusions, not currently requiring vasopressors -Tolerating CRRT with UF 75 ml/hr ~ tentative plan to increase UF to 100-125 ml/hr per Advanced CHF & Nephrology -UOP 300 cc over past 24 hrs (net - 11.2 L) -Work of breathing improved, denies shortness of breath, weaned to room air   Objective   Blood pressure (!) 117/56, pulse 66, temperature (!) 96.4 F (35.8 C), temperature source Axillary, resp. rate (!) 31, height 5\' 8"  (1.727 m), weight 108.6 kg, SpO2 94%. CVP:  [18 mmHg] 18 mmHg  FiO2 (%):  [24 %] 24 %   Intake/Output Summary (Last 24 hours) at 12/04/2022 0717 Last data filed at 12/04/2022 0700 Roberson per 24 hour  Intake 1098.66 ml  Output 2217 ml  Net -1118.34 ml   Filed Weights   12/02/22 0500 12/03/22 0500 12/04/22 0156  Weight: 108.9 kg 109 kg 108.6 kg    Examination: General: Acute on chronically ill-appearing obese male, sitting in bed, on room air, in NAD HENT: Atraumatic, normocephalic, neck supple, positive JVD Lungs: Coarse breath sounds throughout, even, nonlabored, normal effort Cardiovascular: Irregular irregular rhythm, rate controlled, no murmurs, rubs, gallops Abdomen: Obese, soft, nontender, nondistended, no guarding or rebound tenderness, bowel sounds positive x 4 Extremities: Generalized weakness, lymphedema to bilateral lower extremities Neuro: Awake and alert, oriented x 3, moves all extremities to command, no focal deficits, speech clear GU: External male  catheter in place draining dark yellow urine  Resolved Hospital Problem list     Assessment & Plan:   #Acute on Chronic HFpEF, along with prominent RV failure #Permanent Atrial Fibrillation, rate currently controlled #Pulmonary Hypertension (suspect group 2 PH from elevated LVEDP) PMHx: CAD, HTN, HLD Echocardiogram 11/27/22: LVEF 55-60%, mild LVH, indeterminate diastolic parameters, mildly decreased RV function, RV moderately enlarged, severe pulmonary htn, mild to moderate TR Right Heart Cath 12/02/22:  right and left sided failure but RV failure predominated with RA pressure > 20.  -Continuous cardiac monitoring -Maintain MAP >65 -Vasopressors as needed to maintain MAP goal ~ currently not requiring -Advanced HF following, appreciate input -Diuresis as BP and renal function permits ~ Lasix gtt stopped due to worsening AKI ~ volume removal with CRRT  -Continue Milrinone & Amiodarone as per Advanced HF -Continue Heparin gtt for anticoagulation -Hold home antihypertensives (hydralazine, imdur)  #AKI in the setting of Cardiorenal Syndrome #Mild Hyponatremia, suspect Hypotonic Hypervolemic in setting of volume overload PMHx: CKD Stage IV -Monitor I&O's / urinary output -Follow BMP -Ensure adequate renal perfusion -Avoid nephrotoxic agents as able -Replace electrolytes as indicated ~ Pharmacy following for assistance with electrolyte replacement -Nephrology following, appreciate input ~ continue CRRT as per Nephrology  #Acute Hypoxic Respiratory Failure in the setting of Pulmonary Edema and severe Pulmonary Hypertension -  Supplemental O2 as needed to maintain O2 sats >92% -BiPAP if needed (pt is DNR/DNI) -Follow intermittent Chest X-ray & ABG as needed -Bronchodilators prn -Volume removal with CRRT -Pulmonary toilet as able  #Diabetes Mellitus Type II -CBG's ac & qhs; Target range of 140 to 180 -SSI -Follow ICU Hypo/Hyperglycemia protocol     Patient is critically ill with  acute decompensated CHF and progressive cardiorenal syndrome.  Prognosis is quite guarded, high risk for further decompensation, cardiac arrest and death.  Given current acute illness superimposed on multiple chronic comorbidities and advanced age, overall long-term prognosis is poor.  Recommend DNR/DNI status.  Palliative care has been consulted to assist with goals of care conversations.   Best Practice (right click and "Reselect all SmartList Selections" daily)   Diet/type: Regular consistency (see orders) DVT prophylaxis: Heparin gtt GI prophylaxis: N/A Lines: Left internal jugular Trialysis, and is still needed Foley:  N/A Code Status:  DNR Last date of multidisciplinary goals of care discussion [9/13]  9/13:  Pt and his wife updated at bedside on plan of care.  All questions answered.  Labs   CBC: Recent Labs  Lab 12/02/22 0316 12/02/22 0935 12/02/22 0939 12/03/22 0220 12/04/22 0427  WBC 8.6  --   --  7.3 6.7  NEUTROABS  --   --   --  5.8  --   HGB 10.0* 10.5* 10.9* 9.5* 9.2*  HCT 31.7* 31.0* 32.0* 30.1* 29.1*  MCV 96.1  --   --  94.7 93.0  PLT 164  --   --  170 169    Basic Metabolic Panel: Recent Labs  Lab 12/01/22 1824 12/02/22 0316 12/02/22 0935 12/02/22 0939 12/03/22 0220 12/03/22 1645 12/04/22 0427  NA 137 137 137 136 134* 134* 135  K 4.0 4.1 4.1 4.0 4.1 4.0 4.0  CL 96* 96*  --   --  94* 94* 98  CO2 27 27  --   --  26 26 26   GLUCOSE 375* 428*  --   --  332* 343* 213*  BUN 86* 82*  --   --  95* 93* 64*  CREATININE 3.42* 3.33*  --   --  4.22* 4.26* 2.79*  CALCIUM 8.7* 8.7*  --   --  8.3* 8.1* 8.1*  MG 2.6* 2.5*  --   --  2.6*  --  2.3  PHOS  --   --   --   --   --  4.8* 3.1   GFR: Estimated Creatinine Clearance: 24 mL/min (A) (by C-G formula based on SCr of 2.79 mg/dL (H)). Recent Labs  Lab 12/02/22 0316 12/03/22 0220 12/04/22 0427  WBC 8.6 7.3 6.7    Liver Function Tests: Recent Labs  Lab 11/30/22 0923 12/03/22 1645 12/04/22 0427   ALBUMIN 3.2* 3.3* 3.3*   No results for input(s): "LIPASE", "AMYLASE" in the last 168 hours. No results for input(s): "AMMONIA" in the last 168 hours.  ABG    Component Value Date/Time   HCO3 29.6 (H) 12/02/2022 0939   TCO2 31 12/02/2022 0939   O2SAT 61 12/02/2022 0939     Coagulation Profile: Recent Labs  Lab 12/03/22 1439  INR 1.6*    Cardiac Enzymes: No results for input(s): "CKTOTAL", "CKMB", "CKMBINDEX", "TROPONINI" in the last 168 hours.  HbA1C: Hemoglobin A1C  Date/Time Value Ref Range Status  07/22/2018 12:00 AM 10.0  Final   Hgb A1c MFr Bld  Date/Time Value Ref Range Status  06/22/2022 04:13 PM 7.3 (H) 4.8 - 5.6 %  Final    Comment:    (NOTE)         Prediabetes: 5.7 - 6.4         Diabetes: >6.4         Glycemic control for adults with diabetes: <7.0   12/02/2021 04:56 PM 7.7 (H) 4.8 - 5.6 % Final    Comment:    (NOTE) Pre diabetes:          5.7%-6.4%  Diabetes:              >6.4%  Glycemic control for   <7.0% adults with diabetes     CBG: Recent Labs  Lab 12/03/22 1306 12/03/22 1529 12/03/22 1708 12/03/22 2200 12/04/22 0025  GLUCAP 290* 308* 298* 214* 202*    Review of Systems:   Positives in BOLD: Gen: Denies fever, chills, weight change, fatigue, night sweats HEENT: Denies blurred vision, double vision, hearing loss, tinnitus, sinus congestion, rhinorrhea, sore throat, neck stiffness, dysphagia PULM: Denies shortness of breath, cough, sputum production, hemoptysis, wheezing CV: Denies chest pain, edema, orthopnea, paroxysmal nocturnal dyspnea, palpitations GI: Denies abdominal pain, nausea, vomiting, diarrhea, hematochezia, melena, constipation, change in bowel habits GU: Denies dysuria, hematuria, polyuria, oliguria, urethral discharge Endocrine: Denies hot or cold intolerance, polyuria, polyphagia or appetite change Derm: Denies rash, dry skin, scaling or peeling skin change Heme: Denies easy bruising, bleeding, bleeding  gums Neuro: Denies headache, numbness, weakness, slurred speech, loss of memory or consciousness   Past Medical History:  He,  has a past medical history of AKI (acute kidney injury) (HCC) (07/06/2018), CAD (coronary artery disease), Cellulitis of lower extremity (05/31/2019), Chronic heart failure with preserved ejection fraction (HFpEF) (HCC), CKD (chronic kidney disease), stage IV (HCC), Diabetes (HCC), GERD (gastroesophageal reflux disease), History of MI (myocardial infarction) (06/27/2014), HLD (hyperlipidemia), HTN (hypertension), PAH (pulmonary artery hypertension) (HCC), Permanent atrial fibrillation (HCC), and Subdural hematoma (HCC).   Surgical History:   Past Surgical History:  Procedure Laterality Date   BLEPHAROPLASTY     CARDIAC CATHETERIZATION     CORONARY STENT INTERVENTION     CRANIOTOMY Left 02/04/2021   Procedure: CRANIOTOMY FOR LEFT SUBDURAL  HEMATOMA EVACUATION;  Surgeon: Lucy Chris, MD;  Location: [facility] ORS;  Service: Neurosurgery;  Laterality: Left;   CYSTOSCOPY     HERNIA REPAIR     RIGHT HEART CATH N/A 12/02/2022   Procedure: RIGHT HEART CATH;  Surgeon: Laurey Morale, MD;  Location: [facility] INVASIVE CV LAB;  Service: Cardiovascular;  Laterality: N/A;     Social History:   reports that he has quit smoking. He has never used smokeless tobacco. He reports that he does not currently use alcohol after a past usage of about 2.0 standard drinks of alcohol per week. He reports that he does not currently use drugs.   Family History:  His family history includes CAD in his father; Diabetes in his brother; Pancreatic cancer in his mother.   Allergies Allergies  Allergen Reactions   Atorvastatin Hives, Itching and Other (See Comments)    Other reaction(s): Other (See Comments)   Simvastatin Hives, Itching and Other (See Comments)    Other reaction(s): Other (See Comments)     Home Medications  Prior to Admission medications   Medication Sig Start Date End Date  Taking? Authorizing Provider  apixaban (ELIQUIS) 2.5 MG TABS tablet Take 1 tablet (2.5 mg total) by mouth 2 (two) times daily. 11/26/22  Yes Bensimhon, Bevelyn Buckles, MD  Ascorbic Acid (VITAMIN C PO) Take 1 tablet by  mouth in the morning.   Yes [provider]  Cholecalciferol (VITAMIN D3) 1.25 MG (50000 UT) CAPS Take 1 tablet by mouth in the morning.   Yes [provider]  Coenzyme Q10-Vitamin E (QUNOL ULTRA COQ10 PO) Take 1 capsule by mouth in the morning.   Yes [provider]  doxazosin (CARDURA) 1 MG tablet TAKE 1 TABLET(1 MG) BY MOUTH TWICE DAILY 10/05/22  Yes Gollan, Tollie Pizza, MD  Flaxseed, Linseed, (FLAXSEED OIL PO) Take 1 capsule by mouth in the morning.   Yes [provider]  Furosemide (FUROSCIX) 80 MG/10ML CTKT Inject 80 mg into the skin as directed. 07/03/22  Yes Bensimhon, Bevelyn Buckles, MD  furosemide (LASIX) 40 MG tablet Take 1 tablet daily every other day, alternating with 2 tabs daily every other day 09/21/22  Yes Bensimhon, Bevelyn Buckles, MD  glipiZIDE (GLUCOTROL XL) 5 MG 24 hr tablet Take 1 tablet (5 mg total) by mouth daily with breakfast. Patient taking differently: Take 10 mg by mouth daily with breakfast. 07/27/22  Yes [name], MD  hydrALAZINE (APRESOLINE) 100 MG tablet TAKE 1 TABLET BY MOUTH THREE TIMES DAILY, WITH EXTRA 100 MG AS NEEDED FOR HIGH PRESSURE 10/05/22  Yes Gollan, Tollie Pizza, MD  isosorbide mononitrate (IMDUR) 30 MG 24 hr tablet Take 1 tablet (30 mg total) by mouth in the morning, at noon, and at bedtime. 07/10/22  Yes Bensimhon, Bevelyn Buckles, MD  Multiple Vitamin (MULTIVITAMIN WITH MINERALS) TABS tablet Take 1 tablet by mouth in the morning.   Yes [provider]  neomycin-polymyxin b-dexamethasone (MAXITROL) 3.5-10000-0.1 OINT Place 1 Application into the right eye 3 (three) times daily. 11/02/22  Yes [provider]  nitroGLYCERIN (NITROLINGUAL) 0.4 MG/SPRAY spray Place 1 spray under the tongue as directed. 06/22/22 07/11/23 Yes  Bensimhon, Bevelyn Buckles, MD  Omega-3 Fatty Acids (FISH OIL PO) Take 1 capsule by mouth in the morning.   Yes [provider]  rosuvastatin (CRESTOR) 10 MG tablet Take 1 tablet (10 mg total) by mouth 3 (three) times a week. 03/05/22  Yes Bensimhon, Bevelyn Buckles, MD  Turmeric (QC TUMERIC COMPLEX PO) Take 1 capsule by mouth in the morning. Qunol Turmeric Curcumin- 40 MG   Yes [provider]     Critical care time: 40 minutes     Harlon Ditty, AGACNP-BC [facility] Prefer epic messenger for cross cover needs If after hours, please call E-link

## 2022-12-04 NOTE — Consult Note (Signed)
ANTICOAGULATION CONSULT NOTE  Pharmacy Consult for IV Heparin Indication: atrial fibrillation  Patient Measurements: Height: 5\' 8"  (172.7 cm) Weight: 108.6 kg (239 lb 6.7 oz) IBW/kg (Calculated) : 68.4 Heparin Dosing Weight: 93 kg  Labs: Recent Labs    12/02/22 0316 12/02/22 0935 12/02/22 0939 12/03/22 0220 12/03/22 1439 12/03/22 1645 12/04/22 0427 12/04/22 0659  HGB 10.0*   < > 10.9* 9.5*  --   --  9.2*  --   HCT 31.7*   < > 32.0* 30.1*  --   --  29.1*  --   PLT 164  --   --  170  --   --  169  --   APTT  --   --   --   --  52*  --   --  120*  LABPROT  --   --   --   --  18.9*  --   --   --   INR  --   --   --   --  1.6*  --   --   --   HEPARINUNFRC  --   --   --   --   --   --   --  >1.10*  CREATININE 3.33*  --   --  4.22*  --  4.26* 2.79*  --    < > = values in this interval not displayed.    Estimated Creatinine Clearance: 24 mL/min (A) (by C-G formula based on SCr of 2.79 mg/dL (H)).   Medical History: Past Medical History:  Diagnosis Date   AKI (acute kidney injury) (HCC) 07/06/2018   CAD (coronary artery disease)    a. Remote PCI/stenting to LAD w PTCA Diagnoal. RCA 60%; b.  2005/2008 Cardiolites w/ reportedly mild ischemia in Diag territory-->Med rx.   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 04/2019 Echo: EF 55-60%, no rwma, nl RV fxn, RVSP 55.80mmHg. Mod dil LA. Mild-mod MR. Mod dil PA.   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 04/2019 Echo: RVSP 55.68mmHg.   Permanent atrial fibrillation (HCC)    a. CHA2DS2VASc = 5-->dose adjusted eliquis (age/creat).   Subdural hematoma (HCC)    a. 01/2021 in setting of fall s/p L frontotemporal craniotomy.    Medications:   Apixaban 2.5 mg BID (last dose 9/12 at 1100)  Assessment: 82 y/o M with PMH as above admitted with acute on chronic diastolic CHF / RV failure complicated by CKD / cardiorenal syndrome. Patient is now being started on CRRT. Pharmacy consulted to transition apixaban to heparin for now in case further procedures required.  Baseline aPTT 52 (elevated) and INR 1.6 (elevated). Baseline CBC notable for stable anemia.  Goal of Therapy:  Heparin level 0.3-0.7 units/ml aPTT 66 - 102 seconds Monitor platelets by anticoagulation protocol: Yes   Plan: aPTT is slightly supratherapeutic.  ---reduce heparin infusion rate to 1000 units/hr --Re-check aPTT 8 hours from rate change Follow aPTT until correlation established with HL --Daily CBC per protocol while on IV heparin  Kyle Roberson 12/04/2022,1:48 PM

## 2022-12-04 NOTE — Progress Notes (Signed)
[facility] [place], [place] 12/04/22  Subjective:   Hospital day # 8  Patient known to our practice from outpatient follow-up.  He was sent over to the emergency room for admission due to severe volume overload.  Previous weight in the office in April 2024 was 237 pounds. At his last office visit on 11/25/2022, weight had increased to 255 pounds. Patient was referred to the emergency room for IV diuresis.  Patient reports feeling a bit better today. Tolerating CRRT well. Blood pressure was a bit low earlier in the day. UF target set to 100 mL/h for now.   Renal: 09/12 0701 - 09/13 0700 In: 1098.7 [P.O.:290; I.V.:808.7] Out: 2217 [Urine:300] Lab Results  Component Value Date   CREATININE 2.79 (H) 12/04/2022   CREATININE 4.26 (H) 12/03/2022   CREATININE 4.22 (H) 12/03/2022     Objective:  Vital signs in last 24 hours:  Temp:  [96.4 F (35.8 C)-98.3 F (36.8 C)] 97.9 F (36.6 C) (09/13 1200) Pulse Rate:  [58-79] 61 (09/13 1200) Resp:  [16-31] 18 (09/13 1200) BP: (99-136)/(45-77) 110/55 (09/13 1200) SpO2:  [93 %-100 %] 95 % (09/13 1200) FiO2 (%):  [24 %] 24 % (09/13 0000) Weight:  [108.6 kg] 108.6 kg (09/13 0156)  Weight change: -0.4 kg Filed Weights   12/02/22 0500 12/03/22 0500 12/04/22 0156  Weight: 108.9 kg 109 kg 108.6 kg    Intake/Output:    Intake/Output Summary (Last 24 hours) at 12/04/2022 1315 Last data filed at 12/04/2022 1200 Gross per 24 hour  Intake 1063.28 ml  Output 2891 ml  Net -1827.72 ml     Physical Exam: General: Chronically ill-appearing  HEENT Moist oral mucous membranes, lower eyelid conjunctival erythema.  Pulm/lungs Bilateral rales and rhonchi, normal effort  CVS/Heart Irregular  Abdomen:  Soft, nontender  Extremities: 3+ brawny edema bilateral lower extremities extending up to the abdomen  Neurologic: Alert, oriented  Skin: Skin changes from chronic edema.          Basic Metabolic Panel:  Recent Labs   Lab 12/01/22 1824 12/02/22 0316 12/02/22 0935 12/02/22 0939 12/03/22 0220 12/03/22 1645 12/04/22 0427  NA 137 137 137 136 134* 134* 135  K 4.0 4.1 4.1 4.0 4.1 4.0 4.0  CL 96* 96*  --   --  94* 94* 98  CO2 27 27  --   --  26 26 26   GLUCOSE 375* 428*  --   --  332* 343* 213*  BUN 86* 82*  --   --  95* 93* 64*  CREATININE 3.42* 3.33*  --   --  4.22* 4.26* 2.79*  CALCIUM 8.7* 8.7*  --   --  8.3* 8.1* 8.1*  MG 2.6* 2.5*  --   --  2.6*  --  2.3  PHOS  --   --   --   --   --  4.8* 3.1     CBC: Recent Labs  Lab 12/02/22 0316 12/02/22 0935 12/02/22 0939 12/03/22 0220 12/04/22 0427  WBC 8.6  --   --  7.3 6.7  NEUTROABS  --   --   --  5.8  --   HGB 10.0* 10.5* 10.9* 9.5* 9.2*  HCT 31.7* 31.0* 32.0* 30.1* 29.1*  MCV 96.1  --   --  94.7 93.0  PLT 164  --   --  170 169     No results found for: "HEPBSAG", "HEPBSAB", "HEPBIGM"    Microbiology:  Recent Results (from the past 240 hour(s))  MRSA Next Gen by PCR, Nasal     Status: None   Collection Time: 11/30/22  4:10 PM   Specimen: Nasal Mucosa; Nasal Swab  Result Value Ref Range Status   MRSA by PCR Next Gen NOT DETECTED NOT DETECTED Final    Comment: (NOTE) The GeneXpert MRSA Assay (FDA approved for NASAL specimens only), is one component of a comprehensive MRSA colonization surveillance program. It is not intended to diagnose MRSA infection nor to guide or monitor treatment for MRSA infections. Test performance is not FDA approved in patients less than 46 years old. Performed at [facility], [address] [place], [place] [address]     Coagulation Studies: Recent Labs    12/03/22 1439  LABPROT 18.9*  INR 1.6*    Urinalysis: No results for input(s): "COLORURINE", "LABSPEC", "PHURINE", "GLUCOSEU", "HGBUR", "BILIRUBINUR", "KETONESUR", "PROTEINUR", "UROBILINOGEN", "NITRITE", "LEUKOCYTESUR" in the last 72 hours.  Invalid input(s): "APPERANCEUR"    Imaging: DG Chest Port 1 View  Result Date:  12/03/2022 CLINICAL DATA:  [id number] Encounter for central line placement [id number] EXAM: PORTABLE CHEST 1 VIEW COMPARISON:  CXR 11/28/22 FINDINGS: Cardiomegaly. Small bilateral pleural effusions. There are prominent bilateral interstitial opacities could represent pulmonary venous congestion or mild pulmonary edema. Left-sided central venous catheter with tip in the upper SVC. No pneumothorax. Surrounding IMPRESSION: 1. Left-sided central venous catheter with tip in the upper SVC. No pneumothorax. 2. Cardiomegaly with small bilateral pleural effusions and mild pulmonary edema, unchanged. Electronically Signed   By: Lorenza Cambridge M.D.   On: 12/03/2022 15:01   DG Ankle Left Port  Result Date: 12/02/2022 CLINICAL DATA:  Ankle pain EXAM: PORTABLE LEFT ANKLE - 2 VIEW COMPARISON:  None Available. FINDINGS: Vascular and soft tissue calcifications. No fracture or malalignment. Ankle mortise is symmetric. Mild medial degenerative change. Moderate plantar calcaneal spur IMPRESSION: No acute osseous abnormality. Mild degenerative change. Plantar calcaneal spur. Electronically Signed   By: Jasmine Pang M.D.   On: 12/02/2022 21:51     Medications:     prismasol BGK 4/2.5 400 mL/hr at 12/04/22 0200    prismasol BGK 4/2.5 400 mL/hr at 12/04/22 0159   sodium chloride     amiodarone 30 mg/hr (12/04/22 1200)   heparin 1,100 Units/hr (12/04/22 1200)   milrinone 0.125 mcg/kg/min (12/04/22 1201)   prismasol BGK 4/2.5 1,500 mL/hr at 12/04/22 0651    [START ON 12/05/2022] (feeding supplement) PROSource Plus  30 mL Oral BID BM   vitamin C  500 mg Oral BID   Chlorhexidine Gluconate Cloth  6 each Topical Daily   cholecalciferol  2,000 Units Oral Daily   docusate sodium  100 mg Oral BID   doxazosin  1 mg Oral BID   feeding supplement (NEPRO CARB STEADY)  237 mL Oral TID BM   insulin aspart  0-15 Units Subcutaneous TID AC & HS   insulin aspart  3 Units Subcutaneous TID WC   multivitamin  1 tablet Per Tube QHS    neomycin-polymyxin b-dexamethasone  1 Application Right Eye TID   omega-3 acid ethyl esters  1 g Oral Daily   rosuvastatin  10 mg Oral Once per day on Monday Wednesday Friday   sodium chloride flush  3 mL Intravenous Q12H   sodium chloride, acetaminophen **OR** acetaminophen, calamine, fentaNYL (SUBLIMAZE) injection, heparin, ipratropium-albuterol, ondansetron **OR** ondansetron (ZOFRAN) IV, oxyCODONE, polyethylene glycol, sodium chloride flush, traZODone  Assessment/ Plan:  82 y.o. male with type 2 diabetes, coronary disease, chronic kidney disease, chronic diastolic CHF, bilateral lower  extremity lymphedema, permanent atrial fibrillation, pulmonary hypertension, history of subdural hematoma  admitted on 11/26/2022 for Acute on chronic diastolic CHF (congestive heart failure) (HCC) [I50.33] Hypervolemia, unspecified hypervolemia type [E87.70]  Volume overload, lower extremity edema in the setting of acute on chronic diastolic CHF exacerbation and significant pulmonary hypertension.  Initiated on CRRT 12/03/2022 -Patient was initiated on CRRT on 12/03/2022.  Tolerating well thus far.  UF target increased to 100 mL/h as tolerated.  Will gradually try to titrate this up to 125 mL/h.  May end up requiring pressors if hypotension worsens.  Would recommend slow sustained process of ultrafiltration.  Diabetes mellitus type 2 with chronic kidney disease/chronic kidney disease stage IV As above patient to be maintained on CRRT at this time with UF target of 100 mm/h for now and attempting to increase to 125 mL/h.  Currently off of Jardiance.  Patient may progress to ESRD and this was discussed with the family.    LOS: 8 Kyle Roberson 9/13/20241:15 PM  [facility] [address] [place], [place] [phone]

## 2022-12-04 NOTE — Inpatient Diabetes Management (Signed)
Inpatient Diabetes Program Recommendations  AACE/ADA: New Consensus Statement on Inpatient Glycemic Control   Target Ranges:  Prepandial:   less than 140 mg/dL      Peak postprandial:   less than 180 mg/dL (1-2 hours)      Critically ill patients:  140 - 180 mg/dL    Latest Reference Range & Units 12/03/22 07:41 12/03/22 13:06 12/03/22 15:29 12/03/22 17:08 12/03/22 22:00 12/04/22 00:25 12/04/22 07:27  Glucose-Capillary 70 - 99 mg/dL 161 (H) 096 (H) 045 (H) 298 (H) 214 (H) 202 (H) 211 (H)   Review of Glycemic Control  Diabetes history: DM2 Outpatient Diabetes medications: Glipizide XL 10 mg QAM Current orders for Inpatient glycemic control: Novolog 0-15 units AC&HS, Novolog 3 units TID with meals, Jardiance 10 mg daily   Inpatient Diabetes Program Recommendations:     Insulin: Please consider Semglee 5 units daily.  Thanks, Orlando Penner, RN, MSN, CDCES Diabetes Coordinator Inpatient Diabetes Program [phone] (Team Pager from 8am to 5pm)

## 2022-12-04 NOTE — Progress Notes (Signed)
An USGPIV (ultrasound guided PIV) has been placed for short-term vasopressor infusion. A correctly placed ivWatch must be used when administering Vasopressors. Should this treatment be needed beyond 24 hours, central line access should be obtained.  It will be the responsibility of the bedside nurse to follow best practice to prevent extravasations.

## 2022-12-05 DIAGNOSIS — E1122 Type 2 diabetes mellitus with diabetic chronic kidney disease: Secondary | ICD-10-CM | POA: Diagnosis not present

## 2022-12-05 DIAGNOSIS — I5033 Acute on chronic diastolic (congestive) heart failure: Secondary | ICD-10-CM | POA: Diagnosis not present

## 2022-12-05 DIAGNOSIS — R570 Cardiogenic shock: Secondary | ICD-10-CM | POA: Diagnosis not present

## 2022-12-05 DIAGNOSIS — I132 Hypertensive heart and chronic kidney disease with heart failure and with stage 5 chronic kidney disease, or end stage renal disease: Secondary | ICD-10-CM | POA: Diagnosis not present

## 2022-12-05 LAB — CBC
HCT: 30 % — ABNORMAL LOW (ref 39.0–52.0)
Hemoglobin: 9.4 g/dL — ABNORMAL LOW (ref 13.0–17.0)
MCH: 29.7 pg (ref 26.0–34.0)
MCHC: 31.3 g/dL (ref 30.0–36.0)
MCV: 94.6 fL (ref 80.0–100.0)
Platelets: 171 10*3/uL (ref 150–400)
RBC: 3.17 MIL/uL — ABNORMAL LOW (ref 4.22–5.81)
RDW: 14 % (ref 11.5–15.5)
WBC: 6.9 10*3/uL (ref 4.0–10.5)
nRBC: 0 % (ref 0.0–0.2)

## 2022-12-05 LAB — APTT
aPTT: 83 s — ABNORMAL HIGH (ref 24–36)
aPTT: 76 s — ABNORMAL HIGH (ref 24–36)

## 2022-12-05 LAB — MAGNESIUM: Magnesium: 2.6 mg/dL — ABNORMAL HIGH (ref 1.7–2.4)

## 2022-12-05 LAB — RENAL FUNCTION PANEL
Albumin: 3.4 g/dL — ABNORMAL LOW (ref 3.5–5.0)
Albumin: 3.3 g/dL — ABNORMAL LOW (ref 3.5–5.0)
Anion gap: 10 (ref 5–15)
Anion gap: 8 (ref 5–15)
BUN: 41 mg/dL — ABNORMAL HIGH (ref 8–23)
BUN: 45 mg/dL — ABNORMAL HIGH (ref 8–23)
CO2: 26 mmol/L (ref 22–32)
CO2: 26 mmol/L (ref 22–32)
Calcium: 8 mg/dL — ABNORMAL LOW (ref 8.9–10.3)
Calcium: 8.3 mg/dL — ABNORMAL LOW (ref 8.9–10.3)
Chloride: 99 mmol/L (ref 98–111)
Chloride: 100 mmol/L (ref 98–111)
Creatinine, Ser: 1.9 mg/dL — ABNORMAL HIGH (ref 0.61–1.24)
Creatinine, Ser: 2.22 mg/dL — ABNORMAL HIGH (ref 0.61–1.24)
GFR, Estimated: 35 mL/min — ABNORMAL LOW (ref >60)
GFR, Estimated: 29 mL/min — ABNORMAL LOW (ref >60)
Glucose, Bld: 172 mg/dL — ABNORMAL HIGH (ref 70–99)
Glucose, Bld: 216 mg/dL — ABNORMAL HIGH (ref 70–99)
Phosphorus: 2.2 mg/dL — ABNORMAL LOW (ref 2.5–4.6)
Phosphorus: 2.7 mg/dL (ref 2.5–4.6)
Potassium: 4.2 mmol/L (ref 3.5–5.1)
Potassium: 4.1 mmol/L (ref 3.5–5.1)
Sodium: 135 mmol/L (ref 135–145)
Sodium: 134 mmol/L — ABNORMAL LOW (ref 135–145)

## 2022-12-05 LAB — HEPARIN LEVEL (UNFRACTIONATED): Heparin Unfractionated: 1.05 [IU]/mL — ABNORMAL HIGH (ref 0.30–0.70)

## 2022-12-05 LAB — GLUCOSE, CAPILLARY
Glucose-Capillary: 165 mg/dL — ABNORMAL HIGH (ref 70–99)
Glucose-Capillary: 210 mg/dL — ABNORMAL HIGH (ref 70–99)
Glucose-Capillary: 238 mg/dL — ABNORMAL HIGH (ref 70–99)
Glucose-Capillary: 203 mg/dL — ABNORMAL HIGH (ref 70–99)

## 2022-12-05 MED ORDER — TRAZODONE HCL 50 MG PO TABS
25.0000 mg | ORAL_TABLET | Freq: Once | ORAL | Status: AC
  Administered 2022-12-05: 25 mg via ORAL

## 2022-12-05 MED ORDER — TRAZODONE HCL 50 MG PO TABS
25.0000 mg | ORAL_TABLET | Freq: Once | ORAL | Status: DC
  Filled 2022-12-05: qty 1

## 2022-12-05 MED ORDER — FUROSEMIDE 10 MG/ML IJ SOLN
80.0000 mg | Freq: Once | INTRAMUSCULAR | Status: AC
  Administered 2022-12-05: 80 mg via INTRAVENOUS
  Filled 2022-12-05: qty 8

## 2022-12-05 MED ORDER — HEPARIN SODIUM (PORCINE) 1000 UNIT/ML IJ SOLN
1000.0000 [IU] | Freq: Once | INTRAMUSCULAR | Status: AC
  Administered 2022-12-05: 2800 [IU] via INTRAVENOUS
  Filled 2022-12-05 (×2): qty 6

## 2022-12-05 NOTE — Consult Note (Signed)
ANTICOAGULATION CONSULT NOTE  Pharmacy Consult for IV Heparin Indication: atrial fibrillation  Patient Measurements: Height: 5\' 8"  (172.7 cm) Weight: 108.3 kg (238 lb 12.1 oz) IBW/kg (Calculated) : 68.4 Heparin Dosing Weight: 93 kg  Labs: Recent Labs     0000 12/03/22 0220 12/03/22 1439 12/03/22 1645 12/04/22 0427 12/04/22 0659 12/04/22 1558 12/05/22 0259  HGB  --  9.5*  --   --  9.2*  --   --  9.4*  HCT  --  30.1*  --   --  29.1*  --   --  30.0*  PLT  --  170  --   --  169  --   --  171  APTT   < >  --  52*  --   --  120* 106* 83*  LABPROT  --   --  18.9*  --   --   --   --   --   INR  --   --  1.6*  --   --   --   --   --   HEPARINUNFRC  --   --   --   --   --  >1.10*  --  1.05*  CREATININE  --  4.22*  --    < > 2.79*  --  2.15* 1.90*   < > = values in this interval not displayed.    Estimated Creatinine Clearance: 35.2 mL/min (A) (by C-G formula based on SCr of 1.9 mg/dL (H)).   Medical History: Past Medical History:  Diagnosis Date   AKI (acute kidney injury) (HCC) 07/06/2018   CAD (coronary artery disease)    a. Remote PCI/stenting to LAD w PTCA Diagnoal. RCA 60%; b.  2005/2008 Cardiolites w/ reportedly mild ischemia in Diag territory-->Med rx.   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 04/2019 Echo: EF 55-60%, no rwma, nl RV fxn, RVSP 55.79mmHg. Mod dil LA. Mild-mod MR. Mod dil PA.   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 04/2019 Echo: RVSP 55.1mmHg.   Permanent atrial fibrillation (HCC)    a. CHA2DS2VASc = 5-->dose adjusted eliquis (age/creat).   Subdural hematoma (HCC)    a. 01/2021 in setting of fall s/p L frontotemporal craniotomy.    Medications:  Apixaban 2.5 mg  BID (last dose 9/12 at 1100)  Assessment: 82 y/o M with PMH as above admitted with acute on chronic diastolic CHF / RV failure complicated by CKD / cardiorenal syndrome. Patient is now being started on CRRT. Pharmacy consulted to transition apixaban to heparin for now in case further procedures required.  Baseline aPTT 52 (elevated) and INR 1.6 (elevated). Baseline CBC notable for stable anemia.  Goal of Therapy:  Heparin level 0.3-0.7 units/ml aPTT 66 - 102 seconds Monitor platelets by anticoagulation protocol: Yes   Plan:  9/14 @ 0259:  aPTT = 83,  HL = > 1.05 - aPTT therapeutic X 1, HL still elevated from Eliquis PTA - will continue pt on current rate and recheck aPTT in 8 hrs on 914 @ 1100 - will recheck HL on 9/15 with AM labs -- Follow aPTT until correlation established with HL --Daily CBC per protocol while on IV  heparin  Emryn Flanery D 12/05/2022,4:12 AM

## 2022-12-05 NOTE — Progress Notes (Signed)
Dr Cristal Deer informed of pt's heart rate dropping to low 50's. Advised to keep amiodarone running and will reasses if HR further drops below 40's.

## 2022-12-05 NOTE — Progress Notes (Addendum)
1500 Report received.  1545 Patient yelling for his sister. Upset that he cannot make a long distance call from his room. States he is going home.He will not stay here. Patient cannot even move in the bed by himself. 1700 Patient remains extremely confused. Thinks he has been kidnapped. Talked to wife x 2 and D.Nelson NP before he calmed down.1800 Calmer now voided 250 of clear yellow urine while being bladder scanned. Feeding self dinner with help.

## 2022-12-05 NOTE — Progress Notes (Addendum)
NAME:  Kyle Roberson, MRN:  [id number], DOB:  July 05, 1940, LOS: 9 ADMISSION DATE:  11/26/2022, CONSULTATION DATE:  12/03/2022 REFERRING MD:  Dr. Cherylann Ratel, CHIEF COMPLAINT:  Volume Overload   Brief Pt Description / Synopsis:  82 y.o Male with PMHx significant for CKD Stage IV, HFpEF, A. Fib on Eliquis, PAH, SDH, and BLE lymphedema who presented with volume overload in the setting of Acute on Chronic HFpEF and progressive Cardiorenal Syndrome requiring inotropes and Lasix gtt. Ultimately requiring initiation of CRRT.  History of Present Illness:  Kyle Roberson is a 82 year old male with a past medical history significant for CKD stage V, HFpEF, permanent A-fib on Eliquis, pulmonary artery hypertension, hypertension, CAD, hyperlipidemia, bilateral lower extremity lymphedema, and subdural hematoma who presented to [facility] ED on 11/26/2022 from his outpatient nephrologist office due to increasing weight gain, bilateral lower extremity edema and shortness of breath.  His baseline weight is around 220 pounds, and at his nephrology appointment was found to be 255 pounds.  He denied chest pain, cough, fever.  His wife reported that over the last 6-week he has slowly been gaining weight and no longer responding to furosicx (Prior he had been having about a 1 to 2 pound weight loss with furosicx).  They reported he had been adherent to low-sodium diet and watching his fluid intake.  ED Course: Initial Vital Signs: Temperature 97.7 F orally, pulse 64, respiratory 20, blood pressure 174/68, SpO2 96% Significant Labs: Glucose 124, BUN 59, creatinine 2.85, BNP 238, hemoglobin 10.9, hematocrit 34 Imaging Chest X-ray 9/7>>IMPRESSION: 1. Moderate mixed pattern pulmonary edema. 2. Enlarged cardiomediastinal silhouette. Medications Administered: 60 mg IV Lasix x 1 dose  He was admitted by the hospitalist for further workup and treatment.  He was started on Lasix drip.  Advanced heart failure and nephrology were  consulted.  Please see "significant hospital events" section below for full detailed hospital course.  Pertinent  Medical History   Past Medical History:  Diagnosis Date   AKI (acute kidney injury) (HCC) 07/06/2018   CAD (coronary artery disease)    a. Remote PCI/stenting to LAD w PTCA Diagnoal. RCA 60%; b.  2005/2008 Cardiolites w/ reportedly mild ischemia in Diag territory-->Med rx.   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 04/2019 Echo: EF 55-60%, no rwma, nl RV fxn, RVSP 55.25mmHg. Mod dil LA. Mild-mod MR. Mod dil PA.   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 04/2019 Echo: RVSP 55.26mmHg.   Permanent atrial fibrillation (HCC)    a. CHA2DS2VASc = 5-->dose adjusted eliquis (age/creat).   Subdural hematoma (HCC)    a. 01/2021 in setting of fall s/p L frontotemporal craniotomy.    Micro Data:  9/9: MRSA PCR>>negative  Antimicrobials:   Anti-infectives (From admission, onward)    None       Significant Hospital Events: Including procedures, antibiotic start and stop dates in addition to other pertinent events   9/5: Presented to ED from nephrology office, admitted by hospitalist.  Advanced heart failure and nephrology consulted.  Started on Lasix drip. 9/6Peri [facility] UOP with Lasix drip. 9/7: UOP 2.5 L, renal function stable.  Lasix drip increased to 8 mg/hr. 9/8: UOP 3l, lasix gtt continued.  9/9: Creatinine worsened, lasix gtt increassed to 15 mg/hr.  Started on empiric low dose milrione 0.125 for RV support. Plan for Right Heart Cath on Wednesday to assess CO. 9/10: UOP 2.5 L, pt fagitue and respiratory status some improved. Lasix gtt continued, Milrinone increased to 0.25. 9/11:  Started on  Amiodarone gtt due to frequent PVC's on Milrinone.  Underwent Right Heart Cath 9/12: UOP decreased to only 800 cc, BUN and Creatinine worsened.  Lasix discontinued.  Plan to start CRRT, PCCM asked to place temporary HD catheter and to consult.  Code status changed to DNR/DNI. 9/13: Tolerating CRRT with UF 75 ml/hr, not requiring vasopressors. Tentative plan to increase UF to 100-125 ml/hr 9/13: Pt tolerating CRRT with UF 100-125 ml/hr currently net negative 13.8L.  Pt remains on milrinone/amiodarone/heparin gtts  Interim History / Subjective:  Pt with intermittent agitation/delirium overnight, however able to redirect.  Pt in no acute distress on 2L O2 via nasal canula  Objective   Blood pressure (!) 121/47, pulse (!) 54, temperature 97.7 F (36.5 C), temperature source Oral, resp. rate (!) 25, height 5\' 8"  (1.727 m), weight 108.3 kg, SpO2 97%. CVP:  [15 mmHg-16 mmHg] 15 mmHg  FiO2 (%):  [21 %-24 %] 24 %   Intake/Output Summary (Last 24 hours) at 12/05/2022 6606 Last data filed at 12/05/2022 0700 Gross per 24 hour  Intake 1108.66 ml  Output 3709 ml  Net -2600.34 ml   Filed Weights   12/03/22 0500 12/04/22 0156 12/05/22 0124  Weight: 109 kg 108.6 kg 108.3 kg    Examination: General: Acute on chronically ill-appearing obese male, NAD on 2L via nasal canula  HENT: Atraumatic, normocephalic, neck supple,  JVD present  Lungs: Faint rhonchi throughout, even, non labored  Cardiovascular: Irregular irregular rhythm, rate controlled, no m/r/g, 3+ bilateral lower extremity edema Abdomen: +BS x4, soft, obese, non distended, non tender  Extremities: Lymphedema to bilateral lower extremities, moves all extremities  Neuro: Awake, confused to time and intermittently confused to situation, follows commands, PERRLA GU: External male catheter in place draining dark yellow urine  Resolved Hospital Problem list     Assessment & Plan:   #Acute on Chronic HFpEF, along with prominent RV  failure #Permanent Atrial Fibrillation, rate currently controlled #Pulmonary Hypertension (suspect group 2 PH from elevated LVEDP) PMHx: CAD, HTN, HLD Echocardiogram 11/27/22: LVEF 55-60%, mild LVH, indeterminate diastolic parameters, mildly decreased RV function, RV moderately enlarged, severe pulmonary htn, mild to moderate TR Right Heart Cath 12/02/22:  right and left sided failure but RV failure predominated with RA pressure > 20.  - Continuous telemetry monitoring - Vasopressors as needed to maintain MAP 65 or higher~ currently not requiring - Advanced HF following, appreciate input - Diuresis as BP and renal function permits~volume removal with CRRT  - Milrinone & amiodarone gtts as per Advanced HF recommendations  - Continue Heparin gtt for anticoagulation - Hold home antihypertensives (hydralazine and imdur)  #AKI in the setting of Cardiorenal Syndrome #Mild Hyponatremia, suspect Hypotonic Hypervolemic in setting of volume overload~improving  PMHx: CKD Stage IV - Trend BMP  - Strict I's&O's - Replace electrolytes as indicated ~ Pharmacy following for assistance with electrolyte replacement - Nephrology following, appreciate input~CRRT as per Nephrology recommendations   #Acute Hypoxic Respiratory Failure in the setting of Pulmonary Edema and severe Pulmonary Hypertension - Supplemental O2 or Bipap prn to maintain O2 sats >92% and/or dyspnea  - Follow intermittent Chest X-ray & ABG as needed - Bronchodilators prn - Volume removal with CRRT -  Pulmonary toilet as able  #Anemia without signs of bleeding - Follow CBC - Monitor for s/sx of bleeding  - Transfuse for hgb <7  #Diabetes Mellitus Type II - CBG's ac & qhs; Target range of 140 to 180 - SSI and scheduled novolog 3 unit  - Follow ICU Hypo/Hyperglycemia protocol  #Intermittent acute metabolic encephalopathy  - Frequent reorientation  - Maintain sleep/wake cycle  - Encourage family present at bedside   Overnight pt  and pts wife decided to change code status from DNR to FULL CODE.  Palliative Care consulted to discuss goals of care.   Best Practice (right click and "Reselect all SmartList Selections" daily)   Diet/type: Regular consistency (see orders) DVT prophylaxis: Heparin gtt GI prophylaxis: N/A Lines: Left internal jugular Trialysis, and is still needed Foley:  N/A Code Status: Full Code  Last date of multidisciplinary goals of care discussion [12/05/2022]  9/14:  Pt and his wife updated at bedside on plan of care.  All questions answered. Labs   CBC: Recent Labs  Lab 12/02/22 0316 12/02/22 0935 12/02/22 0939 12/03/22 0220 12/04/22 0427 12/05/22 0259  WBC 8.6  --   --  7.3 6.7 6.9  NEUTROABS  --   --   --  5.8  --   --   HGB 10.0* 10.5* 10.9* 9.5* 9.2* 9.4*  HCT 31.7* 31.0* 32.0* 30.1* 29.1* 30.0*  MCV 96.1  --   --  94.7 93.0 94.6  PLT 164  --   --  170 169 171    Basic Metabolic Panel: Recent Labs  Lab 12/01/22 1824 12/02/22 0316 12/02/22 0935 12/03/22 0220 12/03/22 1645 12/04/22 0427 12/04/22 1558 12/05/22 0259  NA 137 137   < > 134* 134* 135 130* 135  K 4.0 4.1   < > 4.1 4.0 4.0 3.8 4.2  CL 96* 96*  --  94* 94* 98 93* 99  CO2 27 27  --  26 26 26 25 26   GLUCOSE 375* 428*  --  332* 343* 213* 385* 172*  BUN 86* 82*  --  95* 93* 64* 50* 41*  CREATININE 3.42* 3.33*  --  4.22* 4.26* 2.79* 2.15* 1.90*  CALCIUM 8.7* 8.7*  --  8.3* 8.1* 8.1* 7.6* 8.0*  MG 2.6* 2.5*  --  2.6*  --  2.3  --  2.6*  PHOS  --   --   --   --  4.8* 3.1 2.3* 2.2*   < > = values in this interval not displayed.   GFR: Estimated Creatinine Clearance: 35.2 mL/min (A) (by C-G formula based on SCr of 1.9 mg/dL (H)). Recent Labs  Lab 12/02/22 0316 12/03/22 0220 12/04/22 0427 12/05/22 0259  WBC 8.6 7.3 6.7 6.9    Liver Function Tests: Recent Labs  Lab 11/30/22 0923 12/03/22 1645 12/04/22 0427 12/04/22 1558 12/05/22 0259  ALBUMIN 3.2* 3.3* 3.3* 3.1* 3.4*   No results for input(s):  "LIPASE", "AMYLASE" in the last 168 hours. No results for input(s): "AMMONIA" in the last 168 hours.  ABG    Component Value Date/Time   HCO3 29.6 (H) 12/02/2022 0939   TCO2 31 12/02/2022 0939   O2SAT 61 12/02/2022 0939     Coagulation Profile: Recent Labs  Lab 12/03/22 1439  INR 1.6*    Cardiac Enzymes: No results for input(s): "CKTOTAL", "CKMB", "CKMBINDEX", "TROPONINI" in the last 168 hours.  HbA1C: Hemoglobin A1C  Date/Time Value Ref Range Status  07/22/2018 12:00 AM 10.0  Final   Hgb A1c  MFr Bld  Date/Time Value Ref Range Status  06/22/2022 04:13 PM 7.3 (H) 4.8 - 5.6 % Final    Comment:    (NOTE)         Prediabetes: 5.7 - 6.4         Diabetes: >6.4         Glycemic control for adults with diabetes: <7.0   12/02/2021 04:56 PM 7.7 (H) 4.8 - 5.6 % Final    Comment:    (NOTE) Pre diabetes:          5.7%-6.4%  Diabetes:              >6.4%  Glycemic control for   <7.0% adults with diabetes     CBG: Recent Labs  Lab 12/04/22 0025 12/04/22 0727 12/04/22 1115 12/04/22 1715 12/04/22 2114  GLUCAP 202* 211* 226* 280* 214*    Review of Systems:   Positives in BOLD: Gen: Denies fever, chills, weight change, fatigue, night sweats HEENT: Denies blurred vision, double vision, hearing loss, tinnitus, sinus congestion, rhinorrhea, sore throat, neck stiffness, dysphagia PULM: Denies shortness of breath, cough, sputum production, hemoptysis, wheezing CV: Denies chest pain, edema, orthopnea, paroxysmal nocturnal dyspnea, palpitations GI: Denies abdominal pain, nausea, vomiting, diarrhea, hematochezia, melena, constipation, change in bowel habits GU: Denies dysuria, hematuria, polyuria, oliguria, urethral discharge Endocrine: Denies hot or cold intolerance, polyuria, polyphagia or appetite change Derm: Denies rash, dry skin, scaling or peeling skin change Heme: Denies easy bruising, bleeding, bleeding gums Neuro: Denies headache, numbness, weakness, slurred speech,  loss of memory or consciousness  Past Medical History:  He,  has a past medical history of AKI (acute kidney injury) (HCC) (07/06/2018), CAD (coronary artery disease), Cellulitis of lower extremity (05/31/2019), Chronic heart failure with preserved ejection fraction (HFpEF) (HCC), CKD (chronic kidney disease), stage IV (HCC), Diabetes (HCC), GERD (gastroesophageal reflux disease), History of MI (myocardial infarction) (06/27/2014), HLD (hyperlipidemia), HTN (hypertension), PAH (pulmonary artery hypertension) (HCC), Permanent atrial fibrillation (HCC), and Subdural hematoma (HCC).   Surgical History:   Past Surgical History:  Procedure Laterality Date   BLEPHAROPLASTY     CARDIAC CATHETERIZATION     CORONARY STENT INTERVENTION     CRANIOTOMY Left 02/04/2021   Procedure: CRANIOTOMY FOR LEFT SUBDURAL  HEMATOMA EVACUATION;  Surgeon: Lucy Chris, MD;  Location: [facility] ORS;  Service: Neurosurgery;  Laterality: Left;   CYSTOSCOPY     HERNIA REPAIR     RIGHT HEART CATH N/A 12/02/2022   Procedure: RIGHT HEART CATH;  Surgeon: Laurey Morale, MD;  Location: [facility] INVASIVE CV LAB;  Service: Cardiovascular;  Laterality: N/A;     Social History:   reports that he has quit smoking. He has never used smokeless tobacco. He reports that he does not currently use alcohol after a past usage of about 2.0 standard drinks of alcohol per week. He reports that he does not currently use drugs.   Family History:  His family history includes CAD in his father; Diabetes in his brother; Pancreatic cancer in his mother.   Allergies Allergies  Allergen Reactions   Atorvastatin Hives, Itching and Other (See Comments)    Other reaction(s): Other (See Comments)   Simvastatin Hives, Itching and Other (See Comments)    Other reaction(s): Other (See Comments)     Home Medications  Prior to Admission medications   Medication Sig Start Date End Date Taking? Authorizing Provider  apixaban (ELIQUIS) 2.5 MG TABS tablet  Take 1 tablet (2.5 mg total) by mouth 2 (two) times daily.  11/26/22  Yes Bensimhon, Bevelyn Buckles, MD  Ascorbic Acid (VITAMIN C PO) Take 1 tablet by mouth in the morning.   Yes [provider]  Cholecalciferol (VITAMIN D3) 1.25 MG (50000 UT) CAPS Take 1 tablet by mouth in the morning.   Yes [provider]  Coenzyme Q10-Vitamin E (QUNOL ULTRA COQ10 PO) Take 1 capsule by mouth in the morning.   Yes [provider]  doxazosin (CARDURA) 1 MG tablet TAKE 1 TABLET(1 MG) BY MOUTH TWICE DAILY 10/05/22  Yes Gollan, Tollie Pizza, MD  Flaxseed, Linseed, (FLAXSEED OIL PO) Take 1 capsule by mouth in the morning.   Yes [provider]  Furosemide (FUROSCIX) 80 MG/10ML CTKT Inject 80 mg into the skin as directed. 07/03/22  Yes Bensimhon, Bevelyn Buckles, MD  furosemide (LASIX) 40 MG tablet Take 1 tablet daily every other day, alternating with 2 tabs daily every other day 09/21/22  Yes Bensimhon, Bevelyn Buckles, MD  glipiZIDE (GLUCOTROL XL) 5 MG 24 hr tablet Take 1 tablet (5 mg total) by mouth daily with breakfast. Patient taking differently: Take 10 mg by mouth daily with breakfast. 07/27/22  Yes [name], MD  hydrALAZINE (APRESOLINE) 100 MG tablet TAKE 1 TABLET BY MOUTH THREE TIMES DAILY, WITH EXTRA 100 MG AS NEEDED FOR HIGH PRESSURE 10/05/22  Yes Gollan, Tollie Pizza, MD  isosorbide mononitrate (IMDUR) 30 MG 24 hr tablet Take 1 tablet (30 mg total) by mouth in the morning, at noon, and at bedtime. 07/10/22  Yes Bensimhon, Bevelyn Buckles, MD  Multiple Vitamin (MULTIVITAMIN WITH MINERALS) TABS tablet Take 1 tablet by mouth in the morning.   Yes [provider]  neomycin-polymyxin b-dexamethasone (MAXITROL) 3.5-10000-0.1 OINT Place 1 Application into the right eye 3 (three) times daily. 11/02/22  Yes [provider]  nitroGLYCERIN (NITROLINGUAL) 0.4 MG/SPRAY spray Place 1 spray under the tongue as directed. 06/22/22 07/11/23 Yes Bensimhon, Bevelyn Buckles, MD  Omega-3 Fatty Acids (FISH OIL PO) Take 1  capsule by mouth in the morning.   Yes [provider]  rosuvastatin (CRESTOR) 10 MG tablet Take 1 tablet (10 mg total) by mouth 3 (three) times a week. 03/05/22  Yes Bensimhon, Bevelyn Buckles, MD  Turmeric (QC TUMERIC COMPLEX PO) Take 1 capsule by mouth in the morning. Qunol Turmeric Curcumin- 40 MG   Yes [provider]     Critical care time: 36 minutes     Zada Girt, AGNP  Pulmonary/Critical Care Pager [phone] (please enter 7 digits) PCCM Consult Pager [phone] (please enter 7 digits)

## 2022-12-05 NOTE — Progress Notes (Signed)
[facility]  ROUNDING NOTE   Subjective:   On CRRT.  UF of 1 L [place]  Wife at bedside. Patient states he still feels weak and tired.   Objective:  Vital signs in last 24 hours:  Temp:  [97.2 F (36.2 C)-98.2 F (36.8 C)] 98.2 F (36.8 C) (09/14 0738) Pulse Rate:  [52-76] 54 (09/14 0700) Resp:  [18-29] 25 (09/14 0700) BP: (84-147)/(47-110) 131/64 (09/14 0900) SpO2:  [94 %-100 %] 97 % (09/14 0700) FiO2 (%):  [21 %-24 %] 24 % (09/14 0600) Weight:  [108.3 kg] 108.3 kg (09/14 0124)  Weight change: -0.3 kg Filed Weights   12/03/22 0500 12/04/22 0156 12/05/22 0124  Weight: 109 kg 108.6 kg 108.3 kg    Intake/Output: I/O last 3 completed shifts: In: 1558.7 [P.O.:410; I.V.:1148.7] Out: 5326 [Urine:300]   Intake/Output this shift:  Total I/O In: 61.8 [I.V.:61.8] Out: 300   Physical Exam: General: Ill appearing  Head: Normocephalic, atraumatic. Moist oral mucosal membranes  Eyes: Anicteric, PERRL  Neck: Supple, trachea midline  Lungs:  Clear to auscultation  Heart: bradycardia  Abdomen:  Soft, nontender  Extremities:  ++ peripheral edema.  Neurologic: Nonfocal, moving all four extremities  Skin: No lesions  Access: LIJ temp HD catheter    Basic Metabolic Panel: Recent Labs  Lab 12/01/22 1824 12/02/22 0316 12/02/22 0935 12/03/22 0220 12/03/22 1645 12/04/22 0427 12/04/22 1558 12/05/22 0259  NA 137 137   < > 134* 134* 135 130* 135  K 4.0 4.1   < > 4.1 4.0 4.0 3.8 4.2  CL 96* 96*  --  94* 94* 98 93* 99  CO2 27 27  --  26 26 26 25 26   GLUCOSE 375* 428*  --  332* 343* 213* 385* 172*  BUN 86* 82*  --  95* 93* 64* 50* 41*  CREATININE 3.42* 3.33*  --  4.22* 4.26* 2.79* 2.15* 1.90*  CALCIUM 8.7* 8.7*  --  8.3* 8.1* 8.1* 7.6* 8.0*  MG 2.6* 2.5*  --  2.6*  --  2.3  --  2.6*  PHOS  --   --   --   --  4.8* 3.1 2.3* 2.2*   < > = values in this interval not displayed.    Liver Function Tests: Recent Labs  Lab 11/30/22 0923 12/03/22 1645  12/04/22 0427 12/04/22 1558 12/05/22 0259  ALBUMIN 3.2* 3.3* 3.3* 3.1* 3.4*   No results for input(s): "LIPASE", "AMYLASE" in the last 168 hours. No results for input(s): "AMMONIA" in the last 168 hours.  CBC: Recent Labs  Lab 12/02/22 0316 12/02/22 0935 12/02/22 0939 12/03/22 0220 12/04/22 0427 12/05/22 0259  WBC 8.6  --   --  7.3 6.7 6.9  NEUTROABS  --   --   --  5.8  --   --   HGB 10.0* 10.5* 10.9* 9.5* 9.2* 9.4*  HCT 31.7* 31.0* 32.0* 30.1* 29.1* 30.0*  MCV 96.1  --   --  94.7 93.0 94.6  PLT 164  --   --  170 169 171    Cardiac Enzymes: No results for input(s): "CKTOTAL", "CKMB", "CKMBINDEX", "TROPONINI" in the last 168 hours.  BNP: Invalid input(s): "POCBNP"  CBG: Recent Labs  Lab 12/04/22 0727 12/04/22 1115 12/04/22 1715 12/04/22 2114 12/05/22 0729  GLUCAP 211* 226* 280* 214* 165*    Microbiology: Results for orders placed or performed during the hospital encounter of 11/26/22  MRSA Next Gen by PCR, Nasal     Status: None  Collection Time: 11/30/22  4:10 PM   Specimen: Nasal Mucosa; Nasal Swab  Result Value Ref Range Status   MRSA by PCR Next Gen NOT DETECTED NOT DETECTED Final    Comment: (NOTE) The GeneXpert MRSA Assay (FDA approved for NASAL specimens only), is one component of a comprehensive MRSA colonization surveillance program. It is not intended to diagnose MRSA infection nor to guide or monitor treatment for MRSA infections. Test performance is not FDA approved in patients less than 70 years old. Performed at [facility], [address] [place], [place] [address]     Coagulation Studies: Recent Labs    12/03/22 1439  LABPROT 18.9*  INR 1.6*    Urinalysis: No results for input(s): "COLORURINE", "LABSPEC", "PHURINE", "GLUCOSEU", "HGBUR", "BILIRUBINUR", "KETONESUR", "PROTEINUR", "UROBILINOGEN", "NITRITE", "LEUKOCYTESUR" in the last 72 hours.  Invalid input(s): "APPERANCEUR"    Imaging: DG Chest Port 1 View  Result  Date: 12/03/2022 CLINICAL DATA:  [id number] Encounter for central line placement [id number] EXAM: PORTABLE CHEST 1 VIEW COMPARISON:  CXR 11/28/22 FINDINGS: Cardiomegaly. Small bilateral pleural effusions. There are prominent bilateral interstitial opacities could represent pulmonary venous congestion or mild pulmonary edema. Left-sided central venous catheter with tip in the upper SVC. No pneumothorax. Surrounding IMPRESSION: 1. Left-sided central venous catheter with tip in the upper SVC. No pneumothorax. 2. Cardiomegaly with small bilateral pleural effusions and mild pulmonary edema, unchanged. Electronically Signed   By: Lorenza Cambridge M.D.   On: 12/03/2022 15:01     Medications:    sodium chloride     heparin 1,000 Units/hr (12/05/22 0700)    (feeding supplement) PROSource Plus  30 mL Oral BID BM   vitamin C  500 mg Oral BID   Chlorhexidine Gluconate Cloth  6 each Topical Daily   cholecalciferol  2,000 Units Oral Daily   docusate sodium  100 mg Oral BID   doxazosin  1 mg Oral BID   feeding supplement (NEPRO CARB STEADY)  237 mL Per Tube TID BM   heparin sodium (porcine)  1,000-6,000 Units Intravenous Once   insulin aspart  0-15 Units Subcutaneous TID AC & HS   insulin aspart  3 Units Subcutaneous TID WC   multivitamin  1 tablet Oral QHS   neomycin-polymyxin b-dexamethasone  1 Application Right Eye TID   omega-3 acid ethyl esters  1 g Oral Daily   [START ON 12/07/2022] rosuvastatin  10 mg Oral Once per day on Monday Wednesday Friday   sodium chloride flush  3 mL Intravenous Q12H   sodium chloride, acetaminophen **OR** acetaminophen, calamine, fentaNYL (SUBLIMAZE) injection, ipratropium-albuterol, ondansetron **OR** ondansetron (ZOFRAN) IV, oxyCODONE, polyethylene glycol, sodium chloride flush, traZODone  Assessment/ Plan:  [name] is a 82 y.o.  male with diabetes mellitus type II, hypertension, coronary artery disease, chronic diastolic congestive heart failure, atrial fibrillation,  pulmonary hypertension, and historyof subdural hematoma who is admitted on 11/26/2022 for Acute on chronic diastolic CHF (congestive heart failure) (HCC) [I50.33] Hypervolemia, unspecified hypervolemia type [E87.70]  Acute Kidney Injury on chronic kidney disease stage IV with proteinuria. Baseline creatinine of 3.24, GFR of 18. 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  - Requiring renal replacement with CVVHDF - hold CRRT and monitor for renal recovery.  - Plan on intermittent hemodialysis if no recovery for Monday.  - Patient may have progressed to ESRD. Discussed with family.   Acute exacerbation of chronic diastolic congestive heart failure: complicated with acute respiratory failure and pulmonary hypertension.  - weaned down to 1 liter O2 [place] -  IV loop diuretics as needed.  - milrinone gtt  Diabetes mellitus type II with chronic kidney disease - holding empagliflozin - continue glucose control.    LOS: 9 Kyle Roberson 9/14/202410:34 AM

## 2022-12-05 NOTE — Consult Note (Signed)
ANTICOAGULATION CONSULT NOTE  Pharmacy Consult for IV Heparin Indication: atrial fibrillation  Patient Measurements: Height: 5\' 8"  (172.7 cm) Weight: 108.3 kg (238 lb 12.1 oz) IBW/kg (Calculated) : 68.4 Heparin Dosing Weight: 93 kg  Labs: Recent Labs     0000 12/03/22 0220 12/03/22 1439 12/03/22 1645 12/04/22 0427 12/04/22 0659 12/04/22 1558 12/05/22 0259  HGB  --  9.5*  --   --  9.2*  --   --  9.4*  HCT  --  30.1*  --   --  29.1*  --   --  30.0*  PLT  --  170  --   --  169  --   --  171  APTT   < >  --  52*  --   --  120* 106* 83*  LABPROT  --   --  18.9*  --   --   --   --   --   INR  --   --  1.6*  --   --   --   --   --   HEPARINUNFRC  --   --   --   --   --  >1.10*  --  1.05*  CREATININE  --  4.22*  --    < > 2.79*  --  2.15* 1.90*   < > = values in this interval not displayed.    Estimated Creatinine Clearance: 35.2 mL/min (A) (by C-G formula based on SCr of 1.9 mg/dL (H)).   Medical History: Past Medical History:  Diagnosis Date   AKI (acute kidney injury) (HCC) 07/06/2018   CAD (coronary artery disease)    a. Remote PCI/stenting to LAD w PTCA Diagnoal. RCA 60%; b.  2005/2008 Cardiolites w/ reportedly mild ischemia in Diag territory-->Med rx.   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 04/2019 Echo: EF 55-60%, no rwma, nl RV fxn, RVSP 55.9mmHg. Mod dil LA. Mild-mod MR. Mod dil PA.   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 04/2019 Echo: RVSP 55.90mmHg.   Permanent atrial fibrillation (HCC)    a. CHA2DS2VASc = 5-->dose adjusted eliquis (age/creat).   Subdural hematoma (HCC)    a. 01/2021 in setting of fall s/p L frontotemporal craniotomy.    Medications:  Apixaban 2.5 mg  BID (last dose 9/12 at 1100)  Assessment: 82 y/o M with PMH as above admitted with acute on chronic diastolic CHF / RV failure complicated by CKD / cardiorenal syndrome. Patient is now being started on CRRT. Pharmacy consulted to transition apixaban to heparin for now in case further procedures required.  Baseline aPTT 52 (elevated) and INR 1.6 (elevated). Baseline CBC notable for stable anemia.  Goal of Therapy:  Heparin level 0.3-0.7 units/ml aPTT 66 - 102 seconds Monitor platelets by anticoagulation protocol: Yes   Plan: aPTT therapeutic X 2 - will continue pt on current rate and recheck aPTT in am 09/15 - will recheck HL on 9/15 with AM labs -- Follow aPTT until correlation established with HL --Daily CBC per protocol while on IV heparin  Lowella Bandy 12/05/2022,10:28 AM

## 2022-12-05 NOTE — Consult Note (Signed)
PHARMACY CONSULT NOTE - ELECTROLYTES  Pharmacy Consult for Electrolyte Monitoring and Replacement   Recent Labs: Potassium (mmol/L)  Date Value  12/05/2022 4.2   Magnesium (mg/dL)  Date Value  30/86/5784 2.6 (H)   Calcium (mg/dL)  Date Value  69/62/9528 8.0 (L)   Albumin (g/dL)  Date Value  41/32/4401 3.4 (L)  03/22/2019 4.0   Phosphorus (mg/dL)  Date Value  02/72/5366 2.2 (L)   Sodium (mmol/L)  Date Value  12/05/2022 135  07/16/2020 140    Height: 5\' 8"  (172.7 cm) Weight: 108.3 kg (238 lb 12.1 oz) IBW/kg (Calculated) : 68.4 Estimated Creatinine Clearance: 35.2 mL/min (A) (by C-G formula based on SCr of 1.9 mg/dL (H)).  Assessment  Kyle Roberson is a 82 y.o. male presenting with acute on chronic CHF. PMH significant for CAD, HFpEF, DM, PAH, Afib, SDH, CKD stage IV. Pharmacy has been consulted to monitor and replace electrolytes.  Patient started on CRRT 9/12, stopped 09/14  Diet: Sodium restricted MIVF: N/A Pertinent medications: Milrinone, amiodarone  Goal of Therapy: Electrolytes within normal limits  Plan:  No electrolyte replacement indicated at this time Recheck electrolytes in am  Thank you for allowing pharmacy to be a part of this patient's care.  Lowella Bandy 12/05/2022 7:04 AM

## 2022-12-05 NOTE — Plan of Care (Signed)
Problem: Education: Goal: Ability to describe self-care measures that may prevent or decrease complications (Diabetes Survival Skills Education) will improve Outcome: Progressing   Problem: Fluid Volume: Goal: Ability to maintain a balanced intake and output will improve Outcome: Progressing   Problem: Metabolic: Goal: Ability to maintain appropriate glucose levels will improve Outcome: Progressing   Problem: Nutritional: Goal: Maintenance of adequate nutrition will improve Outcome: Progressing   Problem: Tissue Perfusion: Goal: Adequacy of tissue perfusion will improve Outcome: Progressing

## 2022-12-05 NOTE — Progress Notes (Addendum)
Rounding Note    Patient Name: Kyle Roberson Date of Encounter: 12/05/2022   [facility] Cardiologist: Julien Nordmann, MD   Subjective   Feels short of breath, fatigue.  Had CRRT yesterday, -2.6 L  Inpatient Medications    Scheduled Meds:  (feeding supplement) PROSource Plus  30 mL Oral BID BM   vitamin C  500 mg Oral BID   Chlorhexidine Gluconate Cloth  6 each Topical Daily   cholecalciferol  2,000 Units Oral Daily   docusate sodium  100 mg Oral BID   doxazosin  1 mg Oral BID   feeding supplement (NEPRO CARB STEADY)  237 mL Per Tube TID BM   insulin aspart  0-15 Units Subcutaneous TID AC & HS   insulin aspart  3 Units Subcutaneous TID WC   multivitamin  1 tablet Oral QHS   neomycin-polymyxin b-dexamethasone  1 Application Right Eye TID   omega-3 acid ethyl esters  1 g Oral Daily   [START ON 12/07/2022] rosuvastatin  10 mg Oral Once per day on Monday Wednesday Friday   sodium chloride flush  3 mL Intravenous Q12H   Continuous Infusions:  sodium chloride     heparin 1,000 Units/hr (12/05/22 0700)   PRN Meds: sodium chloride, acetaminophen **OR** acetaminophen, calamine, fentaNYL (SUBLIMAZE) injection, ipratropium-albuterol, ondansetron **OR** ondansetron (ZOFRAN) IV, oxyCODONE, polyethylene glycol, sodium chloride flush, traZODone   Vital Signs    Vitals:   12/05/22 0900 12/05/22 1000 12/05/22 1100 12/05/22 1200  BP: 131/64 111/81 (!) 126/51 (!) 130/52  Pulse: 61 62 63 (!) 59  Resp: (!) 28 (!) 25 19 19   Temp:      TempSrc:      SpO2: 100% 95% 96% 93%  Weight:      Height:        Intake/Output Summary (Last 24 hours) at 12/05/2022 1351 Last data filed at 12/05/2022 1046 Gross per 24 hour  Intake 910.96 ml  Output 3145 ml  Net -2234.04 ml      12/05/2022    1:24 AM 12/04/2022    1:56 AM 12/03/2022    5:00 AM  Last 3 Weights  Weight (lbs) 238 lb 12.1 oz 239 lb 6.7 oz 240 lb 4.8 oz  Weight (kg) 108.3 kg 108.6 kg 109 kg      Telemetry     Atrial fibrillation, heart rate 61- Personally Reviewed  ECG     - Personally Reviewed  Physical Exam   GEN: Appears fatigued, mild respiratory distress Neck: Unable to assess JVD due to body habitus Cardiac: Irregular irregular Respiratory: Diminished breath sounds bilaterally, inspiratory wheezing GI: Soft, nontender, non-distended  MS: 2+ woody edema Neuro:  Nonfocal  Psych: Normal affect   Labs    High Sensitivity Troponin:  No results for input(s): "TROPONINIHS" in the last 720 hours.   Chemistry Recent Labs  Lab 12/03/22 0220 12/03/22 1645 12/04/22 0427 12/04/22 1558 12/05/22 0259  NA 134*   < > 135 130* 135  K 4.1   < > 4.0 3.8 4.2  CL 94*   < > 98 93* 99  CO2 26   < > 26 25 26   GLUCOSE 332*   < > 213* 385* 172*  BUN 95*   < > 64* 50* 41*  CREATININE 4.22*   < > 2.79* 2.15* 1.90*  CALCIUM 8.3*   < > 8.1* 7.6* 8.0*  MG 2.6*  --  2.3  --  2.6*  ALBUMIN  --    < > 3.3*  3.1* 3.4*  GFRNONAA 13*   < > 22* 30* 35*  ANIONGAP 14   < > 11 12 10    < > = values in this interval not displayed.    Lipids No results for input(s): "CHOL", "TRIG", "HDL", "LABVLDL", "LDLCALC", "CHOLHDL" in the last 168 hours.  Hematology Recent Labs  Lab 12/03/22 0220 12/04/22 0427 12/05/22 0259  WBC 7.3 6.7 6.9  RBC 3.18* 3.13* 3.17*  HGB 9.5* 9.2* 9.4*  HCT 30.1* 29.1* 30.0*  MCV 94.7 93.0 94.6  MCH 29.9 29.4 29.7  MCHC 31.6 31.6 31.3  RDW 13.9 13.9 14.0  PLT 170 169 171   Thyroid No results for input(s): "TSH", "FREET4" in the last 168 hours.  BNPNo results for input(s): "BNP", "PROBNP" in the last 168 hours.  DDimer No results for input(s): "DDIMER" in the last 168 hours.   Radiology    No results found.  Cardiac Studies   TTE 11/27/2022  1. Left ventricular ejection fraction, by estimation, is 55 to 60%. The  left ventricle has normal function. The left ventricle has no regional  wall motion abnormalities. There is mild left ventricular hypertrophy.  Left ventricular  diastolic parameters  are indeterminate.   2. Right ventricular systolic function is normal. The right ventricular  size is moderately enlarged. There is severely elevated pulmonary artery  systolic pressure. The estimated right ventricular systolic pressure is  73.4 mmHg.   3. Left atrial size was severely dilated.   4. Right atrial size was moderately dilated.   5. The mitral valve is normal in structure. Mild mitral valve  regurgitation.   6. Tricuspid valve regurgitation is mild to moderate.   7. The aortic valve is tricuspid. Aortic valve regurgitation is mild.  Aortic valve sclerosis is present, with no evidence of aortic valve  stenosis.   8. The inferior vena cava is dilated in size with <50% respiratory  variability, suggesting right atrial pressure of 15 mmHg.    Patient Profile     82 y.o. male with history of HFpEF, pulmonary hypertension, CKD, permanent atrial fibrillation, CAD/PCI to LAD presenting with shortness of breath and volume overload, being seen for volume overload.  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  Permanent atrial fibrillation -Heparin drip for now -PTA Eliquis on discharge  3. CKD, -On CRRT -As needed IV Lasix -Appreciate input from nephrology  Overall prognosis remain guarded.  Total encounter time more than 50 minutes  Greater than 50% was spent in counseling and coordination of care with the patient     Signed, Debbe Odea, MD  12/05/2022, 1:51 PM

## 2022-12-06 DIAGNOSIS — I4821 Permanent atrial fibrillation: Secondary | ICD-10-CM

## 2022-12-06 DIAGNOSIS — Z515 Encounter for palliative care: Secondary | ICD-10-CM | POA: Diagnosis not present

## 2022-12-06 DIAGNOSIS — E8770 Fluid overload, unspecified: Secondary | ICD-10-CM | POA: Diagnosis not present

## 2022-12-06 DIAGNOSIS — I5033 Acute on chronic diastolic (congestive) heart failure: Secondary | ICD-10-CM | POA: Diagnosis not present

## 2022-12-06 LAB — RENAL FUNCTION PANEL
Albumin: 3.3 g/dL — ABNORMAL LOW (ref 3.5–5.0)
Albumin: 3.1 g/dL — ABNORMAL LOW (ref 3.5–5.0)
Anion gap: 9 (ref 5–15)
Anion gap: 11 (ref 5–15)
BUN: 51 mg/dL — ABNORMAL HIGH (ref 8–23)
BUN: 56 mg/dL — ABNORMAL HIGH (ref 8–23)
CO2: 25 mmol/L (ref 22–32)
CO2: 25 mmol/L (ref 22–32)
Calcium: 8.2 mg/dL — ABNORMAL LOW (ref 8.9–10.3)
Calcium: 8.5 mg/dL — ABNORMAL LOW (ref 8.9–10.3)
Chloride: 100 mmol/L (ref 98–111)
Chloride: 98 mmol/L (ref 98–111)
Creatinine, Ser: 2.43 mg/dL — ABNORMAL HIGH (ref 0.61–1.24)
Creatinine, Ser: 2.73 mg/dL — ABNORMAL HIGH (ref 0.61–1.24)
GFR, Estimated: 26 mL/min — ABNORMAL LOW (ref >60)
GFR, Estimated: 22 mL/min — ABNORMAL LOW (ref >60)
Glucose, Bld: 118 mg/dL — ABNORMAL HIGH (ref 70–99)
Glucose, Bld: 156 mg/dL — ABNORMAL HIGH (ref 70–99)
Phosphorus: 3.9 mg/dL (ref 2.5–4.6)
Phosphorus: 3.9 mg/dL (ref 2.5–4.6)
Potassium: 4.3 mmol/L (ref 3.5–5.1)
Potassium: 4 mmol/L (ref 3.5–5.1)
Sodium: 134 mmol/L — ABNORMAL LOW (ref 135–145)
Sodium: 134 mmol/L — ABNORMAL LOW (ref 135–145)

## 2022-12-06 LAB — GLUCOSE, CAPILLARY
Glucose-Capillary: 95 mg/dL (ref 70–99)
Glucose-Capillary: 143 mg/dL — ABNORMAL HIGH (ref 70–99)
Glucose-Capillary: 127 mg/dL — ABNORMAL HIGH (ref 70–99)
Glucose-Capillary: 130 mg/dL — ABNORMAL HIGH (ref 70–99)
Glucose-Capillary: 198 mg/dL — ABNORMAL HIGH (ref 70–99)

## 2022-12-06 LAB — CBC
HCT: 30.2 % — ABNORMAL LOW (ref 39.0–52.0)
Hemoglobin: 9.5 g/dL — ABNORMAL LOW (ref 13.0–17.0)
MCH: 29.4 pg (ref 26.0–34.0)
MCHC: 31.5 g/dL (ref 30.0–36.0)
MCV: 93.5 fL (ref 80.0–100.0)
Platelets: 175 10*3/uL (ref 150–400)
RBC: 3.23 MIL/uL — ABNORMAL LOW (ref 4.22–5.81)
RDW: 14.1 % (ref 11.5–15.5)
WBC: 7 10*3/uL (ref 4.0–10.5)
nRBC: 0 % (ref 0.0–0.2)

## 2022-12-06 LAB — BASIC METABOLIC PANEL
Anion gap: 11 (ref 5–15)
BUN: 63 mg/dL — ABNORMAL HIGH (ref 8–23)
CO2: 25 mmol/L (ref 22–32)
Calcium: 8.5 mg/dL — ABNORMAL LOW (ref 8.9–10.3)
Chloride: 98 mmol/L (ref 98–111)
Creatinine, Ser: 3.06 mg/dL — ABNORMAL HIGH (ref 0.61–1.24)
GFR, Estimated: 20 mL/min — ABNORMAL LOW (ref >60)
Glucose, Bld: 195 mg/dL — ABNORMAL HIGH (ref 70–99)
Potassium: 4.3 mmol/L (ref 3.5–5.1)
Sodium: 134 mmol/L — ABNORMAL LOW (ref 135–145)

## 2022-12-06 LAB — MAGNESIUM
Magnesium: 2.7 mg/dL — ABNORMAL HIGH (ref 1.7–2.4)
Magnesium: 2.6 mg/dL — ABNORMAL HIGH (ref 1.7–2.4)

## 2022-12-06 LAB — HEPARIN LEVEL (UNFRACTIONATED): Heparin Unfractionated: 0.61 [IU]/mL (ref 0.30–0.70)

## 2022-12-06 LAB — APTT
aPTT: 62 s — ABNORMAL HIGH (ref 24–36)
aPTT: 87 s — ABNORMAL HIGH (ref 24–36)
aPTT: 78 s — ABNORMAL HIGH (ref 24–36)

## 2022-12-06 LAB — HEPATITIS B SURFACE ANTIGEN: Hepatitis B Surface Ag: NONREACTIVE

## 2022-12-06 MED ORDER — FUROSEMIDE 10 MG/ML IJ SOLN
80.0000 mg | Freq: Once | INTRAMUSCULAR | Status: AC
  Administered 2022-12-06: 80 mg via INTRAVENOUS
  Filled 2022-12-06: qty 8

## 2022-12-06 MED ORDER — HEPARIN BOLUS VIA INFUSION
1400.0000 [IU] | Freq: Once | INTRAVENOUS | Status: AC
  Administered 2022-12-06: 1400 [IU] via INTRAVENOUS
  Filled 2022-12-06: qty 1400

## 2022-12-06 NOTE — Consult Note (Signed)
ANTICOAGULATION CONSULT NOTE  Pharmacy Consult for IV Heparin Indication: atrial fibrillation  Patient Measurements: Height: 5\' 8"  (172.7 cm) Weight: 109.7 kg (241 lb 13.5 oz) IBW/kg (Calculated) : 68.4 Heparin Dosing Weight: 93 kg  Labs: Recent Labs    12/04/22 0427 12/04/22 0659 12/04/22 1558 12/05/22 0259 12/05/22 1200 12/05/22 1832 12/06/22 0317 12/06/22 1018 12/06/22 1217 12/06/22 1747  HGB 9.2*  --   --  9.4*  --   --  9.5*  --   --   --   HCT 29.1*  --   --  30.0*  --   --  30.2*  --   --   --   PLT 169  --   --  171  --   --  175  --   --   --   APTT  --  120*   < > 83*   < >  --  62* 87*  --  78*  HEPARINUNFRC  --  >1.10*  --  1.05*  --   --  0.61  --   --   --   CREATININE 2.79*  --    < > 1.90*  --  2.22* 2.43*  --  2.73*  --    < > = values in this interval not displayed.    Estimated Creatinine Clearance: 24.6 mL/min (A) (by C-G formula based on SCr of 2.73 mg/dL (H)).   Medical History: Past Medical History:  Diagnosis Date   AKI (acute kidney injury) (HCC) 07/06/2018   CAD (coronary artery disease)    a. Remote PCI/stenting to LAD w PTCA Diagnoal. RCA 60%; b.  2005/2008 Cardiolites w/ reportedly mild ischemia in Diag territory-->Med rx.   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 04/2019 Echo: EF 55-60%, no rwma, nl RV fxn, RVSP 55.68mmHg. Mod dil LA. Mild-mod MR. Mod dil PA.   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 04/2019 Echo: RVSP 55.30mmHg.   Permanent atrial fibrillation (HCC)    a. CHA2DS2VASc = 5-->dose adjusted eliquis (age/creat).   Subdural hematoma (HCC)    a. 01/2021 in setting of fall s/p L frontotemporal craniotomy.    Medications:  Apixaban 2.5  mg BID (last dose 9/12 at 1100)  Assessment: 82 y/o M with PMH as above admitted with acute on chronic diastolic CHF / RV failure complicated by CKD / cardiorenal syndrome. Patient is now being started on CRRT. Pharmacy consulted to transition apixaban to heparin for now in case further procedures required.  Baseline aPTT 52 (elevated) and INR 1.6 (elevated). Baseline CBC notable for stable anemia.  Goal of Therapy:  Heparin level 0.3-0.7 units/ml aPTT 66 - 102 seconds Monitor platelets by anticoagulation protocol: Yes   Date/time aPTT  HL  Comments 9/15@1018  87  --  Therapeutic x 1 9/15@1747  78  --  Therapeutic x 2 @ 1200 un/hr  Plan:  --aPTT remains within therapeutic range with heparin infusion at 1200 units/hr --Will recheck aPTT and HL with AM labs --Follow aPTT until correlation established with HL --Daily CBC per protocol while on IV heparin  Marx Doig Rodriguez-Guzman PharmD, BCPS 12/06/2022 7:06 PM

## 2022-12-06 NOTE — Progress Notes (Signed)
Daily Progress Note   Patient Name: Kyle Roberson       Date: 12/06/2022 DOB: [birth year]  Age: 82 y.o. MRN#: [id number] Attending Physician: Enedina Finner, MD Primary Care Physician: [name], MD Admit Date: 11/26/2022  Reason for Consultation/Follow-up: Establishing goals of care  HPI/Brief Hospital Review: 82 y.o. male  with past medical history of CAD, CHF, HF PEF, PAH, type 2 diabetes, A-fib, SDH, CKD (stage IV), and bilateral lower extremity lymphedema admitted on 11/26/2022 with weight gain, increased lower extremity edema, and shortness of breath.   9/11 patient had right heart catheterization.  Patient being treated for acute on chronic diastolic CHF, AKI on CKD stage IV, pulmonary hypertension, and cardiorenal syndrome.    Temporary dialysis catheter placed for CRRT with UF initiated.  CRRT discontinued 9/14  From nursing reports, conversations had overnight with ICU NP regarding code status, wife and Kyle Roberson adamant they wish to reverse DNR to Full Code  Requested by nephrology and primary team to join goals of care conversation jointly with Kyle Roberson and wife at bedside.  Subjective: Extensive chart review has been completed prior to meeting patient including labs, vital signs, imaging, progress notes, orders, and available advanced directive documents from current and previous encounters.    Joined bedside with Dr. Wynelle Link and Dr. Allena Katz. Wife-[name] present during time of visit.  Dr. Wynelle Link explained most recent updates related to kidney function and expected need for HD early next week. He also discusses code status.  [name] and Kyle Roberson both clearly shares they wish for Kyle Roberson to remain Full Code and they both are clear in that they wish to pursue HD  when needed.  All questions and concerns addressed.  Thank you for allowing the Palliative Medicine Team to assist in the care of this patient.  Total time:  25 minutes  Time spent includes: Detailed review of medical records (labs, imaging, vital signs), medically appropriate exam (mental status, respiratory, cardiac, skin), discussed with treatment team, counseling and educating patient, family and staff, documenting clinical information, medication management and coordination of care.  Leeanne Deed, DNP, AGNP-C Palliative Medicine   Please contact Palliative Medicine Team phone at [phone] for questions and concerns.

## 2022-12-06 NOTE — Consult Note (Signed)
ANTICOAGULATION CONSULT NOTE  Pharmacy Consult for IV Heparin Indication: atrial fibrillation  Patient Measurements: Height: 5\' 8"  (172.7 cm) Weight: 108.3 kg (238 lb 12.1 oz) IBW/kg (Calculated) : 68.4 Heparin Dosing Weight: 93 kg  Labs: Recent Labs    12/03/22 1439 12/03/22 1645 12/04/22 0427 12/04/22 0659 12/04/22 1558 12/05/22 0259 12/05/22 1200 12/05/22 1832 12/06/22 0317  HGB  --    < > 9.2*  --   --  9.4*  --   --  9.5*  HCT  --   --  29.1*  --   --  30.0*  --   --  30.2*  PLT  --   --  169  --   --  171  --   --  175  APTT 52*  --   --  120*   < > 83* 76*  --  62*  LABPROT 18.9*  --   --   --   --   --   --   --   --   INR 1.6*  --   --   --   --   --   --   --   --   HEPARINUNFRC  --   --   --  >1.10*  --  1.05*  --   --  0.61  CREATININE  --    < > 2.79*  --    < > 1.90*  --  2.22* 2.43*   < > = values in this interval not displayed.    Estimated Creatinine Clearance: 27.5 mL/min (A) (by C-G formula based on SCr of 2.43 mg/dL (H)).   Medical History: Past Medical History:  Diagnosis Date   AKI (acute kidney injury) (HCC) 07/06/2018   CAD (coronary artery disease)    a. Remote PCI/stenting to LAD w PTCA Diagnoal. RCA 60%; b.  2005/2008 Cardiolites w/ reportedly mild ischemia in Diag territory-->Med rx.   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 04/2019 Echo: EF 55-60%, no rwma, nl RV fxn, RVSP 55.48mmHg. Mod dil LA. Mild-mod MR. Mod dil PA.   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 04/2019 Echo: RVSP 55.19mmHg.   Permanent atrial fibrillation (HCC)    a. CHA2DS2VASc = 5-->dose adjusted eliquis (age/creat).   Subdural hematoma (HCC)    a. 01/2021 in setting of fall s/p L  frontotemporal craniotomy.    Medications:  Apixaban 2.5 mg BID (last dose 9/12 at 1100)  Assessment: 82 y/o M with PMH as above admitted with acute on chronic diastolic CHF / RV failure complicated by CKD / cardiorenal syndrome. Patient is now being started on CRRT. Pharmacy consulted to transition apixaban to heparin for now in case further procedures required.  Baseline aPTT 52 (elevated) and INR 1.6 (elevated). Baseline CBC notable for stable anemia.  Goal of Therapy:  Heparin level 0.3-0.7 units/ml aPTT 66 - 102 seconds Monitor platelets by anticoagulation protocol: Yes   Plan:  aPTT therapeutic x 1 following recent rate change ---continue heparin infusion at 1200 units/hr ---Will recheck aPTT in 8 hrs  ---will recheck HL on 9/16 with AM labs -- Follow aPTT until correlation established with HL --Daily CBC per  protocol while on IV heparin  Lowella Bandy 12/06/2022,8:56 AM

## 2022-12-06 NOTE — Progress Notes (Signed)
[facility]  ROUNDING NOTE   Subjective:   Taken off CRRT yesterday.  Wife at bedside.  Patient agitated overnight.   UOP - IV furosemide 80mg  given yesterday  Patient met with palliative care, code status clarified.   Objective:  Vital signs in last 24 hours:  Temp:  [97.6 F (36.4 C)-98.3 F (36.8 C)] 97.6 F (36.4 C) (09/15 0900) Pulse Rate:  [44-63] 53 (09/15 1000) Resp:  [18-29] 26 (09/15 1000) BP: (106-154)/(46-96) 136/58 (09/15 1000) SpO2:  [92 %-100 %] 100 % (09/15 1000)  Weight change:  Filed Weights   12/03/22 0500 12/04/22 0156 12/05/22 0124  Weight: 109 kg 108.6 kg 108.3 kg    Intake/Output: I/O last 3 completed shifts: In: 1055.3 [P.O.:350; I.V.:705.3] Out: 2860 [Urine:650]   Intake/Output this shift:  No intake/output data recorded.  Physical Exam: General: Ill appearing  Head: Normocephalic, atraumatic. Moist oral mucosal membranes  Eyes: Anicteric, PERRL  Neck: Supple, trachea midline  Lungs:  Clear to auscultation  Heart: bradycardia  Abdomen:  Soft, nontender  Extremities:  ++ peripheral edema.  Neurologic: Nonfocal, moving all four extremities  Skin: No lesions  Access: LIJ temp HD catheter    Basic Metabolic Panel: Recent Labs  Lab 12/02/22 0316 12/02/22 0935 12/03/22 0220 12/03/22 1645 12/04/22 0427 12/04/22 1558 12/05/22 0259 12/05/22 1832 12/06/22 0317  NA 137   < > 134*   < > 135 130* 135 134* 134*  K 4.1   < > 4.1   < > 4.0 3.8 4.2 4.1 4.3  CL 96*  --  94*   < > 98 93* 99 100 100  CO2 27  --  26   < > 26 25 26 26 25   GLUCOSE 428*  --  332*   < > 213* 385* 172* 216* 118*  BUN 82*  --  95*   < > 64* 50* 41* 45* 51*  CREATININE 3.33*  --  4.22*   < > 2.79* 2.15* 1.90* 2.22* 2.43*  CALCIUM 8.7*  --  8.3*   < > 8.1* 7.6* 8.0* 8.3* 8.2*  MG 2.5*  --  2.6*  --  2.3  --  2.6*  --  2.7*  PHOS  --   --   --    < > 3.1 2.3* 2.2* 2.7 3.9   < > = values in this interval not displayed.    Liver Function  Tests: Recent Labs  Lab 12/04/22 0427 12/04/22 1558 12/05/22 0259 12/05/22 1832 12/06/22 0317  ALBUMIN 3.3* 3.1* 3.4* 3.3* 3.3*   No results for input(s): "LIPASE", "AMYLASE" in the last 168 hours. No results for input(s): "AMMONIA" in the last 168 hours.  CBC: Recent Labs  Lab 12/02/22 0316 12/02/22 0935 12/02/22 0939 12/03/22 0220 12/04/22 0427 12/05/22 0259 12/06/22 0317  WBC 8.6  --   --  7.3 6.7 6.9 7.0  NEUTROABS  --   --   --  5.8  --   --   --   HGB 10.0*   < > 10.9* 9.5* 9.2* 9.4* 9.5*  HCT 31.7*   < > 32.0* 30.1* 29.1* 30.0* 30.2*  MCV 96.1  --   --  94.7 93.0 94.6 93.5  PLT 164  --   --  170 169 171 175   < > = values in this interval not displayed.    Cardiac Enzymes: No results for input(s): "CKTOTAL", "CKMB", "CKMBINDEX", "TROPONINI" in the last 168 hours.  BNP: Invalid input(s): "POCBNP"  CBG: Recent Labs  Lab 12/05/22 0729 12/05/22 1142 12/05/22 1522 12/05/22 2139 12/06/22 0758  GLUCAP 165* 210* 238* 203* 95    Microbiology: Results for orders placed or performed during the hospital encounter of 11/26/22  MRSA Next Gen by PCR, Nasal     Status: None   Collection Time: 11/30/22  4:10 PM   Specimen: Nasal Mucosa; Nasal Swab  Result Value Ref Range Status   MRSA by PCR Next Gen NOT DETECTED NOT DETECTED Final    Comment: (NOTE) The GeneXpert MRSA Assay (FDA approved for NASAL specimens only), is one component of a comprehensive MRSA colonization surveillance program. It is not intended to diagnose MRSA infection nor to guide or monitor treatment for MRSA infections. Test performance is not FDA approved in patients less than 39 years old. Performed at [facility], [address] [place], [place] [address]     Coagulation Studies: Recent Labs    12/03/22 1439  LABPROT 18.9*  INR 1.6*    Urinalysis: No results for input(s): "COLORURINE", "LABSPEC", "PHURINE", "GLUCOSEU", "HGBUR", "BILIRUBINUR", "KETONESUR", "PROTEINUR",  "UROBILINOGEN", "NITRITE", "LEUKOCYTESUR" in the last 72 hours.  Invalid input(s): "APPERANCEUR"    Imaging: No results found.   Medications:    sodium chloride     heparin 1,200 Units/hr (12/06/22 0700)    (feeding supplement) PROSource Plus  30 mL Oral BID BM   vitamin C  500 mg Oral BID   Chlorhexidine Gluconate Cloth  6 each Topical Daily   cholecalciferol  2,000 Units Oral Daily   docusate sodium  100 mg Oral BID   doxazosin  1 mg Oral BID   feeding supplement (NEPRO CARB STEADY)  237 mL Per Tube TID BM   insulin aspart  0-15 Units Subcutaneous TID AC & HS   insulin aspart  3 Units Subcutaneous TID WC   multivitamin  1 tablet Oral QHS   neomycin-polymyxin b-dexamethasone  1 Application Right Eye TID   omega-3 acid ethyl esters  1 g Oral Daily   [START ON 12/07/2022] rosuvastatin  10 mg Oral Once per day on Monday Wednesday Friday   sodium chloride flush  3 mL Intravenous Q12H   sodium chloride, acetaminophen **OR** acetaminophen, calamine, fentaNYL (SUBLIMAZE) injection, ipratropium-albuterol, ondansetron **OR** ondansetron (ZOFRAN) IV, oxyCODONE, polyethylene glycol, sodium chloride flush, traZODone  Assessment/ Plan:  [name] is a 82 y.o.  male with diabetes mellitus type II, hypertension, coronary artery disease, chronic diastolic congestive heart failure, atrial fibrillation, pulmonary hypertension, and historyof subdural hematoma who is admitted on 11/26/2022 for Acute on chronic diastolic CHF (congestive heart failure) (HCC) [I50.33] Hypervolemia, unspecified hypervolemia type [E87.70]  Acute Kidney Injury on chronic kidney disease stage IV with proteinuria. Baseline creatinine of 3.24, GFR of 18. 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 CRRT from 9/12 to 9/13 - nonoliguric urine output. Responding to loop diuretics.  - IV furosemide today - Plan on intermittent hemodialysis for Monday.  - Patient may have progressed to ESRD. Discussed with family.  Patient is interested in outpatient hemodialysis in [facility] in [place].   Acute exacerbation of chronic diastolic congestive heart failure: complicated with acute respiratory failure and pulmonary hypertension. Currently with junctional rhythm. - weaned down room air - IV loop diuretics as needed.  - appreciate cardiology input.   Diabetes mellitus type II with chronic kidney disease - holding empagliflozin - continue glucose control.    LOS: 10 Kyle Roberson 9/15/202411:17 AM

## 2022-12-06 NOTE — TOC Progression Note (Signed)
Transition of Care [facility]) - Progression Note    Patient Details  Name: Kyle Roberson MRN: [id number] Date of Birth: 05-30-1940  Transition of Care [facility]) CM/SW Contact  Liliana Cline, LCSW Phone Number: 12/06/2022, 11:11 AM  Clinical Narrative:    Checked with MD, will hold off on discussing SNF options until patient is more medically stable.    Expected Discharge Plan: Home/Self Care Barriers to Discharge: No Barriers Identified  Expected Discharge Plan and Services       Living arrangements for the past 2 months: Single Family Home                                       Social Determinants of Health (SDOH) Interventions SDOH Screenings   Food Insecurity: No Food Insecurity (12/01/2022)  Housing: Low Risk  (12/01/2022)  Transportation Needs: No Transportation Needs (12/01/2022)  Utilities: Not At Risk (12/01/2022)  Depression (PHQ2-9): Low Risk  (07/27/2022)  Tobacco Use: Medium Risk (11/26/2022)    Readmission Risk Interventions     No data to display

## 2022-12-06 NOTE — Consult Note (Signed)
ANTICOAGULATION CONSULT NOTE  Pharmacy Consult for IV Heparin Indication: atrial fibrillation  Patient Measurements: Height: 5\' 8"  (172.7 cm) Weight: 108.3 kg (238 lb 12.1 oz) IBW/kg (Calculated) : 68.4 Heparin Dosing Weight: 93 kg  Labs: Recent Labs    12/03/22 1439 12/03/22 1645 12/04/22 0427 12/04/22 0659 12/04/22 1558 12/05/22 0259 12/05/22 1200 12/05/22 1832 12/06/22 0317  HGB  --    < > 9.2*  --   --  9.4*  --   --  9.5*  HCT  --   --  29.1*  --   --  30.0*  --   --  30.2*  PLT  --   --  169  --   --  171  --   --  175  APTT 52*  --   --  120*   < > 83* 76*  --  62*  LABPROT 18.9*  --   --   --   --   --   --   --   --   INR 1.6*  --   --   --   --   --   --   --   --   HEPARINUNFRC  --   --   --  >1.10*  --  1.05*  --   --  0.61  CREATININE  --    < > 2.79*  --    < > 1.90*  --  2.22* 2.43*   < > = values in this interval not displayed.    Estimated Creatinine Clearance: 27.5 mL/min (A) (by C-G formula based on SCr of 2.43 mg/dL (H)).   Medical History: Past Medical History:  Diagnosis Date   AKI (acute kidney injury) (HCC) 07/06/2018   CAD (coronary artery disease)    a. Remote PCI/stenting to LAD w PTCA Diagnoal. RCA 60%; b.  2005/2008 Cardiolites w/ reportedly mild ischemia in Diag territory-->Med rx.   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 04/2019 Echo: EF 55-60%, no rwma, nl RV fxn, RVSP 55.34mmHg. Mod dil LA. Mild-mod MR. Mod dil PA.   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 04/2019 Echo: RVSP 55.81mmHg.   Permanent atrial fibrillation (HCC)    a. CHA2DS2VASc = 5-->dose adjusted eliquis (age/creat).   Subdural hematoma (HCC)    a. 01/2021 in setting of fall s/p L  frontotemporal craniotomy.    Medications:  Apixaban 2.5 mg BID (last dose 9/12 at 1100)  Assessment: 82 y/o M with PMH as above admitted with acute on chronic diastolic CHF / RV failure complicated by CKD / cardiorenal syndrome. Patient is now being started on CRRT. Pharmacy consulted to transition apixaban to heparin for now in case further procedures required.  Baseline aPTT 52 (elevated) and INR 1.6 (elevated). Baseline CBC notable for stable anemia.  Goal of Therapy:  Heparin level 0.3-0.7 units/ml aPTT 66 - 102 seconds Monitor platelets by anticoagulation protocol: Yes   Plan:  9/15 @ 0317:  aPTT = 62,  HL = 0.61 - aPTT SUBtherapeutic, not correlating with HL  - Will order heparin 1400 units IV X 1 bolus and increase drip rate to 1200 units/hr - Will recheck aPTT  8 hrs after rate change  - will recheck HL on 9/16 with AM labs -- Follow aPTT until correlation established with HL --Daily CBC per protocol while on IV heparin  Kimmi Acocella D 12/06/2022,4:05 AM

## 2022-12-06 NOTE — Consult Note (Signed)
PHARMACY CONSULT NOTE - ELECTROLYTES  Pharmacy Consult for Electrolyte Monitoring and Replacement   Recent Labs: Potassium (mmol/L)  Date Value  12/06/2022 4.3   Magnesium (mg/dL)  Date Value  46/96/2952 2.7 (H)   Calcium (mg/dL)  Date Value  84/13/2440 8.2 (L)   Albumin (g/dL)  Date Value  01/17/2535 3.3 (L)  03/22/2019 4.0   Phosphorus (mg/dL)  Date Value  64/40/3474 3.9   Sodium (mmol/L)  Date Value  12/06/2022 134 (L)  07/16/2020 140    Height: 5\' 8"  (172.7 cm) Weight: 108.3 kg (238 lb 12.1 oz) IBW/kg (Calculated) : 68.4 Estimated Creatinine Clearance: 27.5 mL/min (A) (by C-G formula based on SCr of 2.43 mg/dL (H)).  Assessment  Kyle Roberson is a 82 y.o. male presenting with acute on chronic CHF. PMH significant for CAD, HFpEF, DM, PAH, Afib, SDH, CKD stage IV. Pharmacy has been consulted to monitor and replace electrolytes.  Patient started on CRRT 9/12, stopped 09/14  Goal of Therapy: Electrolytes within normal limits  Plan:  No electrolyte replacement indicated at this time Recheck electrolytes in am  Thank you for allowing pharmacy to be a part of this patient's care.  Lowella Bandy 12/06/2022 7:14 AM

## 2022-12-06 NOTE — Progress Notes (Addendum)
Rounding Note    Patient Name: Kyle Roberson Date of Encounter: 12/06/2022  [facility] Cardiologist: Julien Nordmann, MD   Subjective   Somnolent today, wife at bedside, bradycardic with heart rates in high 30s to 40s while asleep.  Inpatient Medications    Scheduled Meds:  (feeding supplement) PROSource Plus  30 mL Oral BID BM   vitamin C  500 mg Oral BID   Chlorhexidine Gluconate Cloth  6 each Topical Daily   cholecalciferol  2,000 Units Oral Daily   docusate sodium  100 mg Oral BID   doxazosin  1 mg Oral BID   feeding supplement (NEPRO CARB STEADY)  237 mL Per Tube TID BM   insulin aspart  0-15 Units Subcutaneous TID AC & HS   insulin aspart  3 Units Subcutaneous TID WC   multivitamin  1 tablet Oral QHS   neomycin-polymyxin b-dexamethasone  1 Application Right Eye TID   omega-3 acid ethyl esters  1 g Oral Daily   [START ON 12/07/2022] rosuvastatin  10 mg Oral Once per day on Monday Wednesday Friday   sodium chloride flush  3 mL Intravenous Q12H   Continuous Infusions:  sodium chloride     heparin 1,200 Units/hr (12/06/22 0700)   PRN Meds: sodium chloride, acetaminophen **OR** acetaminophen, calamine, fentaNYL (SUBLIMAZE) injection, ipratropium-albuterol, ondansetron **OR** ondansetron (ZOFRAN) IV, oxyCODONE, polyethylene glycol, sodium chloride flush, traZODone   Vital Signs    Vitals:   12/06/22 1100 12/06/22 1200 12/06/22 1400 12/06/22 1500  BP: (!) 131/55 (!) 135/55  (!) 133/55  Pulse: (!) 49 (!) 54  (!) 53  Resp: (!) 30 (!) 28  (!) 24  Temp:      TempSrc:      SpO2: 96% 96%  99%  Weight:   109.7 kg   Height:        Intake/Output Summary (Last 24 hours) at 12/06/2022 1522 Last data filed at 12/06/2022 1432 Gross per 24 hour  Intake 488.39 ml  Output 950 ml  Net -461.61 ml      12/06/2022    2:00 PM 12/05/2022    1:24 AM 12/04/2022    1:56 AM  Last 3 Weights  Weight (lbs) 241 lb 13.5 oz 238 lb 12.1 oz 239 lb 6.7 oz  Weight (kg) 109.7  kg 108.3 kg 108.6 kg      Telemetry    Atrial fibrillation, heart rate 54- Personally Reviewed  ECG     - Personally Reviewed  Physical Exam   GEN: Somnolent Neck: Unable to assess JVD due to body habitus Cardiac: Irregular irregular Respiratory: Diminished breath sounds bilaterally, inspiratory wheezing GI: Soft, nontender, non-distended  MS: 2+ woody edema Neuro: Unable to assess Psych: Somnolent  Labs    High Sensitivity Troponin:  No results for input(s): "TROPONINIHS" in the last 720 hours.   Chemistry Recent Labs  Lab 12/04/22 0427 12/04/22 1558 12/05/22 0259 12/05/22 1832 12/06/22 0317 12/06/22 1217  NA 135   < > 135 134* 134* 134*  K 4.0   < > 4.2 4.1 4.3 4.0  CL 98   < > 99 100 100 98  CO2 26   < > 26 26 25 25   GLUCOSE 213*   < > 172* 216* 118* 156*  BUN 64*   < > 41* 45* 51* 56*  CREATININE 2.79*   < > 1.90* 2.22* 2.43* 2.73*  CALCIUM 8.1*   < > 8.0* 8.3* 8.2* 8.5*  MG 2.3  --  2.6*  --  2.7*  --   ALBUMIN 3.3*   < > 3.4* 3.3* 3.3* 3.1*  GFRNONAA 22*   < > 35* 29* 26* 22*  ANIONGAP 11   < > 10 8 9 11    < > = values in this interval not displayed.    Lipids No results for input(s): "CHOL", "TRIG", "HDL", "LABVLDL", "LDLCALC", "CHOLHDL" in the last 168 hours.  Hematology Recent Labs  Lab 12/04/22 0427 12/05/22 0259 12/06/22 0317  WBC 6.7 6.9 7.0  RBC 3.13* 3.17* 3.23*  HGB 9.2* 9.4* 9.5*  HCT 29.1* 30.0* 30.2*  MCV 93.0 94.6 93.5  MCH 29.4 29.7 29.4  MCHC 31.6 31.3 31.5  RDW 13.9 14.0 14.1  PLT 169 171 175   Thyroid No results for input(s): "TSH", "FREET4" in the last 168 hours.  BNPNo results for input(s): "BNP", "PROBNP" in the last 168 hours.  DDimer No results for input(s): "DDIMER" in the last 168 hours.   Radiology    No results found.  Cardiac Studies   TTE 11/27/2022  1. Left ventricular ejection fraction, by estimation, is 55 to 60%. The  left ventricle has normal function. The left ventricle has no regional  wall motion  abnormalities. There is mild left ventricular hypertrophy.  Left ventricular diastolic parameters  are indeterminate.   2. Right ventricular systolic function is normal. The right ventricular  size is moderately enlarged. There is severely elevated pulmonary artery  systolic pressure. The estimated right ventricular systolic pressure is  73.4 mmHg.   3. Left atrial size was severely dilated.   4. Right atrial size was moderately dilated.   5. The mitral valve is normal in structure. Mild mitral valve  regurgitation.   6. Tricuspid valve regurgitation is mild to moderate.   7. The aortic valve is tricuspid. Aortic valve regurgitation is mild.  Aortic valve sclerosis is present, with no evidence of aortic valve  stenosis.   8. The inferior vena cava is dilated in size with <50% respiratory  variability, suggesting right atrial pressure of 15 mmHg.    Patient Profile     82 y.o. male with history of HFpEF, pulmonary hypertension, CKD, permanent atrial fibrillation, CAD/PCI to LAD presenting with shortness of breath and volume overload, being seen for volume overload.  Assessment & Plan    HFpEF, pulmonary hypertension -Volume control with CRRT/HD -Patient may be in end-stage renal disease requiring permanent dialysis -Overall prognosis remain poor  2.  Permanent atrial fibrillation -Heart rate currently in 50s -May become bradycardic while asleep high 30s low 40s. -Heparin drip for now -PTA Eliquis on discharge  3. CKD, -May need permanent dialysis, - IV Lasix today as per nephrology  Overall prognosis remain guarded.  Total encounter time more than 50 minutes  Greater than 50% was spent in counseling and coordination of care with wife at bedside     Signed, Debbe Odea, MD  12/06/2022, 3:22 PM

## 2022-12-06 NOTE — Progress Notes (Signed)
[facility]  - [place] at [facility]   PATIENT NAME: Kyle Roberson    MR#:  [id number]  DATE OF BIRTH:  08-Jul-1940  SUBJECTIVE:  Wife at bedside.  Patient now has been off CR RT. Maybe need hemodialysis according to labs and urine output. Seen along with palliative care and Dr. Wynelle Link today. Patient agreeable for hemodialysis if needed. Patient has been off amiodarone and milrinone gtt  VITALS:  Blood pressure (!) 135/55, pulse (!) 54, temperature 97.6 F (36.4 C), temperature source Oral, resp. rate (!) 28, height 5\' 8"  (1.727 m), weight 108.3 kg, SpO2 96%.  PHYSICAL EXAMINATION:   GENERAL:  82 y.o.-year-old patient with no acute distress. Obese LUNGS:decreased  breath sounds bilaterally, mild wheezing CARDIOVASCULAR: S1, S2 normal. No murmur   ABDOMEN: Soft, nontender, abdominal obesity EXTREMITIES: bilateral chronic lymphedema with hyperkeratosis NEUROLOGIC: nonfocal  patient is alert and awake, deconditioned  LABORATORY PANEL:  CBC Recent Labs  Lab 12/06/22 0317  WBC 7.0  HGB 9.5*  HCT 30.2*  PLT 175    Chemistries  Recent Labs  Lab 12/06/22 0317  NA 134*  K 4.3  CL 100  CO2 25  GLUCOSE 118*  BUN 51*  CREATININE 2.43*  CALCIUM 8.2*  MG 2.7*   Assessment and Plan   Kyle Roberson is a 82 y.o. 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 Garnett Farm office with increasing weight gain, bilateral lower extremity swelling and shortness of breath.  Patient's baseline weight is 220 pounds. Weight and nephrology office was 255 pounds. Patient is being admitted with acute on chronic diastolic heart failure.   Acute on chronic diastolic congestive heart failure Pulmonary HTN -- patient came in with volume overload with  increasing weight up to 255 pounds. Dry weight around ~220 pounds -- last echo was done in December 2023 which showed elevated right ventricular pressure and severe pulmonary hypertension -- will repeat echo -- cardiology consultation with [facility] heart failure Dr Larina Bras -- patient received Lasix 60 mg IV once will start patient on Lasix drip 6 mg/hr Dr. Doristine Church recommendation -- monitor input output and creatinine -- diuresing well --pt does NOT wear oxygen at home per wife --9/7--Lasix gtt increased to 8 mg/hr. mild wheezing. Added duonebs. Wife worried about hypoglycemia does not want sliding scale insulin while patient in house. Will change CBG three times a day. --now on Farxiga per Cards rec --9/9-- transferred to ICU for IV milrinone gtt per [facility] heart failure MD.  --metolazone x1 --Lasix gtt increased to 15 mg /hr --9/10-- tried to work with PT--got fatigued very easily --s/p RHC on 9/11  right and left sided failure but RV failure predominated with RA pressure > 20.  --9/14--assumed care--pt now off CRRT. 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 Abram Sander --CRRT on hold   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 -- sliding scale insulin --- on farxiga and glipizide XL ar home --hold   Morbid obesity -- complicates overall prognosis   Hypertension -- on doxazosin   Hyperlipidemia -- statins   CAD with remote PCI -- continue statins   History of paroxysmal a fib -- rate well-controlled --  patient on IV heparin gtt  Palliative care input appreciated    Advance Care Planning:   Code Status: Full Code discussed with patient and wife in the room with Palliative care and Dr Wynelle Link  Consults: cardiology, nephrology  Family Communication: wife at bedside  DVT  Prophylaxis :eliquis Level of care: Stepdown Status is: Inpatient Remains inpatient appropriate because: CHF,CKD-V    TOTAL TIME TAKING CARE OF THIS PATIENT: 35 minutes.  >50% time spent on counselling and coordination of care  Note: This dictation was prepared with Dragon dictation along with smaller phrase technology. Any transcriptional errors that result from this process are unintentional.  Enedina Finner M.D    [facility]   CC: Primary care physician; [name], MD

## 2022-12-06 NOTE — Plan of Care (Signed)
  Problem: Education: Goal: Ability to describe self-care measures that may prevent or decrease complications (Diabetes Survival Skills Education) will improve Outcome: Progressing Goal: Individualized Educational Video(s) Outcome: Progressing   Problem: Coping: Goal: Ability to adjust to condition or change in health will improve Outcome: Progressing   Problem: Fluid Volume: Goal: Ability to maintain a balanced intake and output will improve Outcome: Progressing   Problem: Health Behavior/Discharge Planning: Goal: Ability to identify and utilize available resources and services will improve Outcome: Progressing Goal: Ability to manage health-related needs will improve Outcome: Progressing   Problem: Metabolic: Goal: Ability to maintain appropriate glucose levels will improve Outcome: Progressing   Problem: Nutritional: Goal: Maintenance of adequate nutrition will improve Outcome: Progressing Goal: Progress toward achieving an optimal weight will improve Outcome: Progressing   Problem: Education: Goal: Knowledge of General Education information will improve Description: Including pain rating scale, medication(s)/side effects and non-pharmacologic comfort measures Outcome: Progressing   Problem: Tissue Perfusion: Goal: Adequacy of tissue perfusion will improve Outcome: Progressing   Problem: Clinical Measurements: Goal: Ability to maintain clinical measurements within normal limits will improve Outcome: Progressing Goal: Will remain free from infection Outcome: Progressing Goal: Diagnostic test results will improve Outcome: Progressing Goal: Respiratory complications will improve Outcome: Progressing Goal: Cardiovascular complication will be avoided Outcome: Progressing   Problem: Nutrition: Goal: Adequate nutrition will be maintained Outcome: Progressing   Problem: Coping: Goal: Level of anxiety will decrease Outcome: Progressing   Problem:  Elimination: Goal: Will not experience complications related to bowel motility Outcome: Progressing Goal: Will not experience complications related to urinary retention Outcome: Progressing

## 2022-12-07 DIAGNOSIS — I5033 Acute on chronic diastolic (congestive) heart failure: Secondary | ICD-10-CM | POA: Diagnosis not present

## 2022-12-07 LAB — RENAL FUNCTION PANEL
Albumin: 3.2 g/dL — ABNORMAL LOW (ref 3.5–5.0)
Anion gap: 9 (ref 5–15)
BUN: 59 mg/dL — ABNORMAL HIGH (ref 8–23)
CO2: 25 mmol/L (ref 22–32)
Calcium: 8.5 mg/dL — ABNORMAL LOW (ref 8.9–10.3)
Chloride: 99 mmol/L (ref 98–111)
Creatinine, Ser: 3.21 mg/dL — ABNORMAL HIGH (ref 0.61–1.24)
GFR, Estimated: 18 mL/min — ABNORMAL LOW (ref >60)
Glucose, Bld: 107 mg/dL — ABNORMAL HIGH (ref 70–99)
Phosphorus: 5.1 mg/dL — ABNORMAL HIGH (ref 2.5–4.6)
Potassium: 4.3 mmol/L (ref 3.5–5.1)
Sodium: 133 mmol/L — ABNORMAL LOW (ref 135–145)

## 2022-12-07 LAB — GLUCOSE, CAPILLARY
Glucose-Capillary: 90 mg/dL (ref 70–99)
Glucose-Capillary: 129 mg/dL — ABNORMAL HIGH (ref 70–99)
Glucose-Capillary: 163 mg/dL — ABNORMAL HIGH (ref 70–99)
Glucose-Capillary: 135 mg/dL — ABNORMAL HIGH (ref 70–99)

## 2022-12-07 LAB — CBC
HCT: 30.2 % — ABNORMAL LOW (ref 39.0–52.0)
Hemoglobin: 9.5 g/dL — ABNORMAL LOW (ref 13.0–17.0)
MCH: 29.7 pg (ref 26.0–34.0)
MCHC: 31.5 g/dL (ref 30.0–36.0)
MCV: 94.4 fL (ref 80.0–100.0)
Platelets: 165 10*3/uL (ref 150–400)
RBC: 3.2 MIL/uL — ABNORMAL LOW (ref 4.22–5.81)
RDW: 14.2 % (ref 11.5–15.5)
WBC: 5.4 10*3/uL (ref 4.0–10.5)
nRBC: 0 % (ref 0.0–0.2)

## 2022-12-07 LAB — MAGNESIUM: Magnesium: 2.5 mg/dL — ABNORMAL HIGH (ref 1.7–2.4)

## 2022-12-07 LAB — HEPARIN LEVEL (UNFRACTIONATED): Heparin Unfractionated: 0.7 [IU]/mL (ref 0.30–0.70)

## 2022-12-07 LAB — APTT: aPTT: 95 s — ABNORMAL HIGH (ref 24–36)

## 2022-12-07 LAB — PROTIME-INR
INR: 1.2 (ref 0.8–1.2)
Prothrombin Time: 15.6 s — ABNORMAL HIGH (ref 11.4–15.2)

## 2022-12-07 MED ORDER — NEPRO/CARBSTEADY PO LIQD
237.0000 mL | Freq: Three times a day (TID) | ORAL | Status: DC
  Administered 2022-12-08 – 2022-12-20 (×5): 237 mL via ORAL

## 2022-12-07 MED ORDER — FUROSEMIDE 20 MG PO TABS
40.0000 mg | ORAL_TABLET | Freq: Once | ORAL | Status: DC
  Filled 2022-12-07: qty 2

## 2022-12-07 MED ORDER — TORSEMIDE 20 MG PO TABS
80.0000 mg | ORAL_TABLET | Freq: Once | ORAL | Status: AC
  Administered 2022-12-07: 80 mg via ORAL
  Filled 2022-12-07: qty 4

## 2022-12-07 MED ORDER — TORSEMIDE 20 MG PO TABS: 80.0000 mg | ORAL_TABLET | Freq: Two times a day (BID) | ORAL | Status: DC

## 2022-12-07 NOTE — Progress Notes (Signed)
Occupational Therapy Treatment Patient Details Name: Kyle Roberson MRN: [id number] DOB: 1940-06-18 Today's Date: 12/07/2022   History of present illness Jocqui Dixon is an 81yoM who comes to [facility] from OP nephrology on 9/5 due to LEE and ABD swelling. PMH: CKD, CHF. As of 9/9 he was transferred to ICU for IV milrinone gtt per [facility] heart failure MD.   OT comments  Mr Bolitho was seen for OT/PT co-treatment on this date. Upon arrival to room pt reclined in bed, agreeable to tx. Pt requires MOD A x2 sup<>sit, fair sitting balance. O2 doffed in sitting - maintains 90% on RA during mobility. MOD A x2 + RW sit<>stand x3 and MAX A pericare standing. Pt making progress toward goals, will continue to follow POC. Discharge recommendation remains appropriate.        If plan is discharge home, recommend the following:  A lot of help with bathing/dressing/bathroom;Assistance with cooking/housework;Assist for transportation;Help with stairs or ramp for entrance;Two people to help with walking and/or transfers   Equipment Recommendations  BSC/3in1    Recommendations for Other Services      Precautions / Restrictions Precautions Precautions: Fall Restrictions Weight Bearing Restrictions: No       Mobility Bed Mobility Overal bed mobility: Needs Assistance Bed Mobility: Supine to Sit, Sit to Supine   Sidelying to sit: Mod assist, +2 for physical assistance Supine to sit: Mod assist, +2 for physical assistance          Transfers Overall transfer level: Needs assistance Equipment used: Rolling walker (2 wheels) Transfers: Sit to/from Stand Sit to Stand: Mod assist, +2 physical assistance           General transfer comment: x3 stands with seated rest break - cues for PLB     Balance Overall balance assessment: Needs assistance Sitting-balance support: Feet supported Sitting balance-Leahy Scale: Fair   Postural control: Left lateral lean Standing balance support: Bilateral  upper extremity supported Standing balance-Leahy Scale: Poor Standing balance comment: L lateral lean                           ADL either performed or assessed with clinical judgement   ADL Overall ADL's : Needs assistance/impaired                                       General ADL Comments: MAX A don B socks bed level. MOD A x2 + RW simulated BSC t/f, MAX A pericare standing      Cognition Arousal: Alert Behavior During Therapy: WFL for tasks assessed/performed Overall Cognitive Status: Within Functional Limits for tasks assessed                                                General Comments SpO2 90% on RA in sitting    Pertinent Vitals/ Pain       Pain Assessment Pain Assessment: No/denies pain   Frequency  Min 1X/week        Progress Toward Goals  OT Goals(current goals can now be found in the care plan section)  Progress towards OT goals: Progressing toward goals  Acute Rehab OT Goals Patient Stated Goal: go home OT Goal Formulation: With patient/family Time For Goal Achievement: 12/17/22 Potential to  Achieve Goals: Good ADL Goals Pt Will Perform Lower Body Dressing: with caregiver independent in assisting;sit to/from stand Pt Will Transfer to Toilet: ambulating;bedside commode;with supervision Pt Will Perform Toileting - Clothing Manipulation and hygiene: with caregiver independent in assisting Additional ADL Goal #1: Pt will verbalize plan to implement at least 2 learned ECS/falls prevention strategies to maximize safety/indep with ADL and mobility.  Plan      Co-evaluation    PT/OT/SLP Co-Evaluation/Treatment: Yes Reason for Co-Treatment: Complexity of the patient's impairments (multi-system involvement);To address functional/ADL transfers PT goals addressed during session: Mobility/safety with mobility OT goals addressed during session: ADL's and self-care      AM-PAC OT "6 Clicks" Daily Activity      Outcome Measure   Help from another person eating meals?: None Help from another person taking care of personal grooming?: A Little Help from another person toileting, which includes using toliet, bedpan, or urinal?: A Lot Help from another person bathing (including washing, rinsing, drying)?: A Lot Help from another person to put on and taking off regular upper body clothing?: A Little Help from another person to put on and taking off regular lower body clothing?: A Lot 6 Click Score: 16    End of Session    OT Visit Diagnosis: Other abnormalities of gait and mobility (R26.89);Muscle weakness (generalized) (M62.81)   Activity Tolerance Patient tolerated treatment well   Patient Left in bed;with call bell/phone within reach;with family/visitor present   Nurse Communication Mobility status        Time: 4098-1191 OT Time Calculation (min): 23 min  Charges: OT General Charges $OT Visit: 1 Visit OT Treatments $Self Care/Home Management : 8-22 mins  Kathie Dike, M.S. OTR/L  12/07/22, 3:45 PM  ascom [phone]

## 2022-12-07 NOTE — Progress Notes (Signed)
Physical Therapy Treatment Patient Details Name: Kyle Roberson MRN: [id number] DOB: 02-15-1941 Today's Date: 12/07/2022   History of Present Illness Khamden Minish is an 81yoM who comes to [facility] from OP nephrology on 9/5 due to LEE and ABD swelling. PMH: CKD, CHF. As of 9/9 he was transferred to ICU for IV milrinone gtt per [facility] heart failure MD.    PT Comments  Patient is agreeable to PT session. Supportive spouse at the bedside. Patient required +2 person assistance for bed mobility and transfers. 3 standing bouts performed with emphasis on midline posture, increasing standing time in preparation for ambulation attempts. Sp02 90-93% on room air with activity. Patient reports no pain in the legs this session. Recommend to continue PT to maximize independence and facilitate return to prior level of function.     If plan is discharge home, recommend the following: A lot of help with walking and/or transfers;A lot of help with bathing/dressing/bathroom;Assist for transportation;Help with stairs or ramp for entrance;Assistance with cooking/housework   Can travel by private vehicle     No  Equipment Recommendations  None recommended by PT    Recommendations for Other Services       Precautions / Restrictions Precautions Precautions: Fall Restrictions Weight Bearing Restrictions: No     Mobility  Bed Mobility Overal bed mobility: Needs Assistance Bed Mobility: Supine to Sit, Sit to Supine   Sidelying to sit: Mod assist, +2 for physical assistance Supine to sit: Mod assist, +2 for physical assistance     General bed mobility comments: assistance for trunk and BLE support. verbal cues for technique and task initiation    Transfers Overall transfer level: Needs assistance Equipment used: Rolling walker (2 wheels) Transfers: Sit to/from Stand Sit to Stand: Mod assist, +2 physical assistance           General transfer comment: 3 standing bouts performed with cues for  anterior weight shifting. rest breaks required between bouts of standing due to fatigue    Ambulation/Gait             Pre-gait activities: patient able to take one side step to the left with Mod A + 2 (2 bouts) with rolling walker, cues for upright posture, encouragement to increase standing tolerance in preparation for ambulation     Stairs             Wheelchair Mobility     Tilt Bed    Modified Rankin (Stroke Patients Only)       Balance Overall balance assessment: Needs assistance Sitting-balance support: Feet supported Sitting balance-Leahy Scale: Fair Sitting balance - Comments: poor initially progressing to fair with increased sitting time Postural control: Left lateral lean Standing balance support: Bilateral upper extremity supported Standing balance-Leahy Scale: Poor Standing balance comment: external support required with heavy reliance on rolling walker for support                            Cognition Arousal: Alert Behavior During Therapy: WFL for tasks assessed/performed Overall Cognitive Status: Within Functional Limits for tasks assessed                                 General Comments: patient is able to follow single step commands with increased time        Exercises      General Comments General comments (skin integrity, edema, etc.): Sp02 90-92% on  room air with activity. cues for breathing techniques. increased respiration rate initiallly with sitting up      Pertinent Vitals/Pain Pain Assessment Pain Assessment: No/denies pain    Home Living                          Prior Function            PT Goals (current goals can now be found in the care plan section) Acute Rehab PT Goals Patient Stated Goal: to get better PT Goal Formulation: With patient/family Time For Goal Achievement: 12/11/22 Potential to Achieve Goals: Fair Progress towards PT goals: Progressing toward goals     Frequency    Min 1X/week      PT Plan      Co-evaluation PT/OT/SLP Co-Evaluation/Treatment: Yes Reason for Co-Treatment: Complexity of the patient's impairments (multi-system involvement);To address functional/ADL transfers PT goals addressed during session: Mobility/safety with mobility        AM-PAC PT "6 Clicks" Mobility   Outcome Measure  Help needed turning from your back to your side while in a flat bed without using bedrails?: A Lot Help needed moving from lying on your back to sitting on the side of a flat bed without using bedrails?: A Lot Help needed moving to and from a bed to a chair (including a wheelchair)?: A Lot Help needed standing up from a chair using your arms (e.g., wheelchair or bedside chair)?: A Lot Help needed to walk in hospital room?: A Lot Help needed climbing 3-5 steps with a railing? : Total 6 Click Score: 11    End of Session   Activity Tolerance: Patient tolerated treatment well Patient left: in bed;with call bell/phone within reach;with family/visitor present Nurse Communication: Mobility status PT Visit Diagnosis: Unsteadiness on feet (R26.81);Muscle weakness (generalized) (M62.81)     Time: 4098-1191 PT Time Calculation (min) (ACUTE ONLY): 33 min  Charges:    $Therapeutic Activity: 8-22 mins PT General Charges $$ ACUTE PT VISIT: 1 Visit                     Donna Bernard, PT, MPT    Ina Homes 12/07/2022, 3:00 PM

## 2022-12-07 NOTE — Consult Note (Signed)
ANTICOAGULATION CONSULT NOTE  Pharmacy Consult for IV Heparin Indication: atrial fibrillation  Patient Measurements: Height: 5\' 8"  (172.7 cm) Weight: 109.7 kg (241 lb 13.5 oz) IBW/kg (Calculated) : 68.4 Heparin Dosing Weight: 93 kg  Labs: Recent Labs    12/05/22 0259 12/05/22 1200 12/06/22 0317 12/06/22 1018 12/06/22 1217 12/06/22 1747 12/06/22 2209 12/07/22 0328  HGB 9.4*  --  9.5*  --   --   --   --  9.5*  HCT 30.0*  --  30.2*  --   --   --   --  30.2*  PLT 171  --  175  --   --   --   --  165  APTT 83*   < > 62* 87*  --  78*  --  95*  LABPROT  --   --   --   --   --   --   --  15.6*  INR  --   --   --   --   --   --   --  1.2  HEPARINUNFRC 1.05*  --  0.61  --   --   --   --  0.70  CREATININE 1.90*   < > 2.43*  --  2.73*  --  3.06* 3.21*   < > = values in this interval not displayed.    Estimated Creatinine Clearance: 20.9 mL/min (A) (by C-G formula based on SCr of 3.21 mg/dL (H)).   Medical History: Past Medical History:  Diagnosis Date   AKI (acute kidney injury) (HCC) 07/06/2018   CAD (coronary artery disease)    a. Remote PCI/stenting to LAD w PTCA Diagnoal. RCA 60%; b.  2005/2008 Cardiolites w/ reportedly mild ischemia in Diag territory-->Med rx.   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 04/2019 Echo: EF 55-60%, no rwma, nl RV fxn, RVSP 55.49mmHg. Mod dil LA. Mild-mod MR. Mod dil PA.   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 04/2019 Echo: RVSP 55.65mmHg.   Permanent atrial fibrillation (HCC)    a. CHA2DS2VASc = 5-->dose adjusted eliquis (age/creat).   Subdural hematoma (HCC)    a. 01/2021 in setting of fall s/p L frontotemporal craniotomy.    Medications:  Apixaban 2.5  mg BID (last dose 9/12 at 1100)  Assessment: 82 y/o M with PMH as above admitted with acute on chronic diastolic CHF / RV failure complicated by CKD / cardiorenal syndrome. Patient is now being started on CRRT. Pharmacy consulted to transition apixaban to heparin for now in case further procedures required.  Baseline aPTT 52 (elevated) and INR 1.6 (elevated). Baseline CBC notable for stable anemia.  Goal of Therapy:  Heparin level 0.3-0.7 units/ml aPTT 66 - 102 seconds Monitor platelets by anticoagulation protocol: Yes   Date/time aPTT  HL  Comments 9/15@1018  87  --  Therapeutic x 1 9/15@1747  78  --  Therapeutic x 2 @ 1200 un/hr 9/16@0328      95                   0.7                   Therapeutic X 3  Plan:  9/16 @ 0328:  aPTT = 95,  HL = 0.7 - aPTT therapeutic X 3 , now correlating with HL  - Will use HL to guide dosing from here on - Will recheck HL on 9/17 with AM labs --Daily CBC per protocol while on IV heparin  Luba Matzen D 12/07/2022 4:06 AM

## 2022-12-07 NOTE — Progress Notes (Signed)
[facility]  - [place] at [facility]   PATIENT NAME: Kyle Roberson    MR#:  [id number]  DATE OF BIRTH:  [birth year]  SUBJECTIVE:  Wife at bedside.  Patient now has been off CR RT.  family has decided no dialysis. Seen by heart failure cardiologist patient trying IV Lasix. Blood pressure stable. Heart rate 45 to 57 VITALS:  Blood pressure (!) 140/58, pulse (!) 49, temperature 98.6 F (37 C), temperature source Oral, resp. rate (!) 30, height 5\' 8"  (1.727 m), weight 109.7 kg, SpO2 98%.  PHYSICAL EXAMINATION:   GENERAL:  82 y.o.-year-old patient with no acute distress. Obese, chronically ill LUNGS:decreased  breath sounds bilaterally, mild wheezing CARDIOVASCULAR: S1, S2 normal. No murmur   ABDOMEN: Soft, nontender, abdominal obesity EXTREMITIES: bilateral chronic lymphedema with severe hyperkeratosis NEUROLOGIC: nonfocal  patient is alert and awake, deconditioned  LABORATORY PANEL:  CBC Recent Labs  Lab 12/07/22 0328  WBC 5.4  HGB 9.5*  HCT 30.2*  PLT 165    Chemistries  Recent Labs  Lab 12/07/22 0328  NA 133*  K 4.3  CL 99  CO2 25  GLUCOSE 107*  BUN 59*  CREATININE 3.21*  CALCIUM 8.5*  MG 2.5*   Assessment and Plan   Kyle Roberson is a 82 y.o. 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 Garnett Farm office with increasing weight gain, bilateral lower extremity swelling and shortness of breath.  Patient's baseline weight is 220 pounds. Weight and nephrology office was 255 pounds. Patient is being admitted with acute on chronic diastolic heart failure.   Acute on chronic diastolic congestive heart failure Pulmonary HTN -- patient came in with volume overload with increasing weight up to 255 pounds. Dry weight around ~220  pounds -- last echo was done in December 2023 which showed elevated right ventricular pressure and severe pulmonary hypertension -- will repeat echo -- cardiology consultation with [facility] heart failure Dr Larina Bras -- patient received Lasix 60 mg IV once will start patient on Lasix drip 6 mg/hr Dr. Doristine Church recommendation -- monitor input output and creatinine -- diuresing well --pt does NOT wear oxygen at home per wife --9/7--Lasix gtt increased to 8 mg/hr. mild wheezing. Added duonebs. Wife worried about hypoglycemia does not want sliding scale insulin while patient in house. Will change CBG three times a day. --now on Farxiga per Cards rec --9/9-- transferred to ICU for IV milrinone gtt per [facility] heart failure MD.  --metolazone x1 --Lasix gtt increased to 15 mg /hr --9/10-- tried to work with PT--got fatigued very easily --s/p RHC on 9/11  right and left sided failure but RV failure predominated with RA pressure > 20.  --9/14--assumed care--pt now off CRRT. May need HD depending on volume status and UOP --9/15--CRRT on hold cont IV diuresis --9/16--seen by Dr Daneil Dolin who discussed with patient and wife overall poor heart condition. Hold hemodialysis. Patient got  IV Lasix 80 mg  x1. Monitor output and creatinine.   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 -- sliding scale insulin --- on farxiga and glipizide XL at home --hold   Morbid obesity --  complicates overall prognosis   Hypertension -- on doxazosin   Hyperlipidemia -- statins   CAD with remote PCI -- continue statins   History of paroxysmal a fib -- rate well-controlled -- patient on IV heparin gtt-- consider PO DOAC if ok with cards since not  procedures planned  Palliative care input appreciated    Advance Care Planning:   Code Status: Full Code discussed with patient and wife in the room with Palliative care and Dr Wynelle Link  Consults: cardiology, nephrology  Family Communication: wife at bedside  DVT Prophylaxis :eliquis Level of care: Stepdown Status is: Inpatient Remains inpatient appropriate because: CHF,CKD-V    TOTAL TIME TAKING CARE OF THIS PATIENT: 35 minutes.  >50% time spent on counselling and coordination of care  Note: This dictation was prepared with Dragon dictation along with smaller phrase technology. Any transcriptional errors that result from this process are unintentional.  Enedina Finner M.D    [facility]   CC: Primary care physician; [name], MD

## 2022-12-07 NOTE — Progress Notes (Signed)
[facility]  ROUNDING NOTE   Subjective:   Sitting up in bed Wife at bedside.  Room air  UOP - IV furosemide 80mg  given as needed   Objective:  Vital signs in last 24 hours:  Temp:  [98 F (36.7 C)-98.9 F (37.2 C)] 98.6 F (37 C) (09/16 0800) Pulse Rate:  [44-59] 49 (09/16 0900) Resp:  [20-35] 27 (09/16 0900) BP: (127-141)/(47-73) 129/60 (09/16 0900) SpO2:  [96 %-100 %] 99 % (09/16 0900) Weight:  [109.7 kg] 109.7 kg (09/15 1400)  Weight change:  Filed Weights   12/04/22 0156 12/05/22 0124 12/06/22 1400  Weight: 108.6 kg 108.3 kg 109.7 kg    Intake/Output: I/O last 3 completed shifts: In: 436.8 [I.V.:436.8] Out: 1100 [Urine:1100]   Intake/Output this shift:  Total I/O In: 24 [I.V.:24] Out: -   Physical Exam: General: Ill appearing  Head: Normocephalic, atraumatic. Moist oral mucosal membranes  Eyes: Anicteric, PERRL  Neck: Supple, trachea midline  Lungs:  Clear to auscultation  Heart: bradycardia  Abdomen:  Soft, nontender  Extremities:  ++ peripheral edema.  Neurologic: Nonfocal, moving all four extremities  Skin: No lesions  Access: LIJ temp HD catheter    Basic Metabolic Panel: Recent Labs  Lab 12/04/22 0427 12/04/22 1558 12/05/22 0259 12/05/22 1832 12/06/22 0317 12/06/22 1217 12/06/22 2209 12/07/22 0328  NA 135   < > 135 134* 134* 134* 134* 133*  K 4.0   < > 4.2 4.1 4.3 4.0 4.3 4.3  CL 98   < > 99 100 100 98 98 99  CO2 26   < > 26 26 25 25 25 25   GLUCOSE 213*   < > 172* 216* 118* 156* 195* 107*  BUN 64*   < > 41* 45* 51* 56* 63* 59*  CREATININE 2.79*   < > 1.90* 2.22* 2.43* 2.73* 3.06* 3.21*  CALCIUM 8.1*   < > 8.0* 8.3* 8.2* 8.5* 8.5* 8.5*  MG 2.3  --  2.6*  --  2.7*  --  2.6* 2.5*  PHOS 3.1   < > 2.2* 2.7 3.9 3.9  --  5.1*   < > = values in this interval not displayed.    Liver Function Tests: Recent Labs  Lab 12/05/22 0259 12/05/22 1832 12/06/22 0317 12/06/22 1217 12/07/22 0328  ALBUMIN 3.4* 3.3* 3.3*  3.1* 3.2*   No results for input(s): "LIPASE", "AMYLASE" in the last 168 hours. No results for input(s): "AMMONIA" in the last 168 hours.  CBC: Recent Labs  Lab 12/03/22 0220 12/04/22 0427 12/05/22 0259 12/06/22 0317 12/07/22 0328  WBC 7.3 6.7 6.9 7.0 5.4  NEUTROABS 5.8  --   --   --   --   HGB 9.5* 9.2* 9.4* 9.5* 9.5*  HCT 30.1* 29.1* 30.0* 30.2* 30.2*  MCV 94.7 93.0 94.6 93.5 94.4  PLT 170 169 171 175 165    Cardiac Enzymes: No results for input(s): "CKTOTAL", "CKMB", "CKMBINDEX", "TROPONINI" in the last 168 hours.  BNP: Invalid input(s): "POCBNP"  CBG: Recent Labs  Lab 12/06/22 1136 12/06/22 1606 12/06/22 1649 12/06/22 2124 12/07/22 0756  GLUCAP 143* 127* 130* 198* 90    Microbiology: Results for orders placed or performed during the hospital encounter of 11/26/22  MRSA Next Gen by PCR, Nasal     Status: None   Collection Time: 11/30/22  4:10 PM   Specimen: Nasal Mucosa; Nasal Swab  Result Value Ref Range Status   MRSA by PCR Next Gen NOT DETECTED NOT DETECTED Final  Comment: (NOTE) The GeneXpert MRSA Assay (FDA approved for NASAL specimens only), is one component of a comprehensive MRSA colonization surveillance program. It is not intended to diagnose MRSA infection nor to guide or monitor treatment for MRSA infections. Test performance is not FDA approved in patients less than 78 years old. Performed at [facility], [address] [place], [place] [address]     Coagulation Studies: Recent Labs    12/07/22 0328  LABPROT 15.6*  INR 1.2    Urinalysis: No results for input(s): "COLORURINE", "LABSPEC", "PHURINE", "GLUCOSEU", "HGBUR", "BILIRUBINUR", "KETONESUR", "PROTEINUR", "UROBILINOGEN", "NITRITE", "LEUKOCYTESUR" in the last 72 hours.  Invalid input(s): "APPERANCEUR"    Imaging: No results found.   Medications:    sodium chloride     heparin 1,200 Units/hr (12/07/22 0900)    (feeding supplement) PROSource Plus  30 mL Oral  BID BM   vitamin C  500 mg Oral BID   Chlorhexidine Gluconate Cloth  6 each Topical Daily   cholecalciferol  2,000 Units Oral Daily   docusate sodium  100 mg Oral BID   doxazosin  1 mg Oral BID   feeding supplement (NEPRO CARB STEADY)  237 mL Oral TID BM   insulin aspart  0-15 Units Subcutaneous TID AC & HS   insulin aspart  3 Units Subcutaneous TID WC   multivitamin  1 tablet Oral QHS   neomycin-polymyxin b-dexamethasone  1 Application Right Eye TID   omega-3 acid ethyl esters  1 g Oral Daily   rosuvastatin  10 mg Oral Once per day on Monday Wednesday Friday   sodium chloride flush  3 mL Intravenous Q12H   sodium chloride, acetaminophen **OR** acetaminophen, calamine, fentaNYL (SUBLIMAZE) injection, ipratropium-albuterol, ondansetron **OR** ondansetron (ZOFRAN) IV, oxyCODONE, polyethylene glycol, sodium chloride flush, traZODone  Assessment/ Plan:  [name] is a 82 y.o.  male with diabetes mellitus type II, hypertension, coronary artery disease, chronic diastolic congestive heart failure, atrial fibrillation, pulmonary hypertension, and historyof subdural hematoma who is admitted on 11/26/2022 for Acute on chronic diastolic CHF (congestive heart failure) (HCC) [I50.33] Hypervolemia, unspecified hypervolemia type [E87.70]  Acute Kidney Injury on chronic kidney disease stage IV with proteinuria. Baseline creatinine of 3.24, GFR of 18. 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 CRRT from 9/12 to 9/13 - Offered dialysis today. Patient's wife states she would like to hold dialysis for now and utilize diuresis for management.  - Will hold dialysis today and order oral Furosemide 40mg  BID, per family request.  - We are optimistic this will be successful at this stage, but will honor family wishes  - Family would like to exhaust all medical therapies prior to considering hemodialysis.    Acute exacerbation of chronic diastolic congestive heart failure: complicated with acute  respiratory failure and pulmonary hypertension. Currently with junctional rhythm. - Remains on room air - Will manage fluid with diuresis.  Diabetes mellitus type II with chronic kidney disease - holding empagliflozin - Glucose well controlled.    LOS: 11 Kyle Roberson 9/16/202410:00 AM

## 2022-12-07 NOTE — Consult Note (Addendum)
PHARMACY CONSULT NOTE - ELECTROLYTES  Pharmacy Consult for Electrolyte Monitoring and Replacement   Recent Labs: Potassium (mmol/L)  Date Value  12/07/2022 4.3   Magnesium (mg/dL)  Date Value  57/84/6962 2.5 (H)   Calcium (mg/dL)  Date Value  95/28/4132 8.5 (L)   Albumin (g/dL)  Date Value  44/03/270 3.2 (L)  03/22/2019 4.0   Phosphorus (mg/dL)  Date Value  53/66/4403 5.1 (H)   Sodium (mmol/L)  Date Value  12/07/2022 133 (L)  07/16/2020 140    Height: 5\' 8"  (172.7 cm) Weight: 109.7 kg (241 lb 13.5 oz) IBW/kg (Calculated) : 68.4 Estimated Creatinine Clearance: 20.9 mL/min (A) (by C-G formula based on SCr of 3.21 mg/dL (H)).  Assessment  Kyle Roberson is a 81 y.o. male presenting with acute on chronic CHF. PMH significant for CAD, HFpEF, DM, PAH, Afib, SDH, CKD stage IV. Pharmacy has been consulted to monitor and replace electrolytes.   Patient started on CRRT 9/12, stopped 09/14  Goal of Therapy: Electrolytes within normal limits  Plan:  No electrolyte replacement indicated at this time Electrolytes have continued to be stable, will sign-off and continue to follow peripherally  Thank you for allowing pharmacy to be a part of this patient's care.  Darolyn Rua, PharmD Student [employer]

## 2022-12-07 NOTE — Progress Notes (Signed)
Patient ID: Kyle Roberson, male   DOB: [birth year], 82 y.o.   MRN: [id number]     Advanced Heart Failure Rounding Note  PCP-Cardiologist: Julien Nordmann, MD   Subjective:    Patient was started on CVVH due to worsening renal function and intractable volume overload.   CVVHD stopped over the weekend. Plans were for iHD today but patient now making some urine and patient and wife would like to hold off to see if he will have renal recovery.  Says he feels ok. Denies SOB, orthopnea or PND. Scr 3.1 -> 3.2    K 4.3 . No uremia  RHC Procedural Findings (on milrinone 0.25): Hemodynamics (mmHg) RA mean 23 RV 64/24 PA 73/28, mean 45 PCWP mean 23 Oxygen saturations: PA 60% AO 93% Cardiac Output (Fick) 6.32  Cardiac Index (Fick) 2.82 PVR 3.5 WU PAPi 1.96   Objective:   Weight Range: 109.7 kg Body mass index is 36.77 kg/m.   Vital Signs:   Temp:  [97.8 F (36.6 C)-98.9 F (37.2 C)] 97.8 F (36.6 C) (09/16 1200) Pulse Rate:  [44-59] 52 (09/16 1400) Resp:  [17-35] 33 (09/16 1400) BP: (127-141)/(47-73) 129/53 (09/16 1200) SpO2:  [96 %-100 %] 99 % (09/16 1400) Last BM Date : 12/06/22  Weight change: Filed Weights   12/04/22 0156 12/05/22 0124 12/06/22 1400  Weight: 108.6 kg 108.3 kg 109.7 kg   Intake/Output:   Intake/Output Summary (Last 24 hours) at 12/07/2022 1422 Last data filed at 12/07/2022 1400 Gross per 24 hour  Intake 371.69 ml  Output 800 ml  Net -428.31 ml    Physical Exam   General:  Elderly. Weak appearing. No resp difficulty HEENT: normal injected sclera Neck: supple. JVP 6-7  +LIJ HD cath Carotids 2+ bilat; no bruits. No lymphadenopathy or thryomegaly appreciated. Cor: Irregular rate & rhythm. No rubs, gallops or murmurs. Lungs: clear Abdomen: obese soft, nontender, nondistended. No hepatosplenomegaly. No bruits or masses. Good bowel sounds. Extremities: no cyanosis, clubbing, rash, edema Neuro: alert & orientedx3, cranial nerves grossly intact.  moves all 4 extremities w/o difficulty. Affect pleasant  Telemetry   Atrial fibrillation rate 50-60s, (personally reviewed)  Labs    CBC Recent Labs    12/06/22 0317 12/07/22 0328  WBC 7.0 5.4  HGB 9.5* 9.5*  HCT 30.2* 30.2*  MCV 93.5 94.4  PLT 175 165   Basic Metabolic Panel Recent Labs    40/98/11 1217 12/06/22 2209 12/07/22 0328  NA 134* 134* 133*  K 4.0 4.3 4.3  CL 98 98 99  CO2 25 25 25   GLUCOSE 156* 195* 107*  BUN 56* 63* 59*  CREATININE 2.73* 3.06* 3.21*  CALCIUM 8.5* 8.5* 8.5*  MG  --  2.6* 2.5*  PHOS 3.9  --  5.1*   Liver Function Tests Recent Labs    12/06/22 1217 12/07/22 0328  ALBUMIN 3.1* 3.2*   No results for input(s): "LIPASE", "AMYLASE" in the last 72 hours. Cardiac Enzymes No results for input(s): "CKTOTAL", "CKMB", "CKMBINDEX", "TROPONINI" in the last 72 hours.  BNP: BNP (last 3 results) Recent Labs    06/22/22 1613 08/24/22 1559 11/26/22 1555  BNP 171.0* 170.1* 238.6*    ProBNP (last 3 results) No results for input(s): "PROBNP" in the last 8760 hours.   D-Dimer No results for input(s): "DDIMER" in the last 72 hours. Hemoglobin A1C No results for input(s): "HGBA1C" in the last 72 hours. Fasting Lipid Panel No results for input(s): "CHOL", "HDL", "LDLCALC", "TRIG", "CHOLHDL", "LDLDIRECT" in the last 72 hours.  Thyroid Function Tests No results for input(s): "TSH", "T4TOTAL", "T3FREE", "THYROIDAB" in the last 72 hours.  Invalid input(s): "FREET3"  Other results:   Imaging   No results found.  Medications:   Scheduled Medications:  (feeding supplement) PROSource Plus  30 mL Oral BID BM   vitamin C  500 mg Oral BID   Chlorhexidine Gluconate Cloth  6 each Topical Daily   cholecalciferol  2,000 Units Oral Daily   docusate sodium  100 mg Oral BID   doxazosin  1 mg Oral BID   feeding supplement (NEPRO CARB STEADY)  237 mL Oral TID BM   insulin aspart  0-15 Units Subcutaneous TID AC & HS   insulin aspart  3 Units  Subcutaneous TID WC   multivitamin  1 tablet Oral QHS   neomycin-polymyxin b-dexamethasone  1 Application Right Eye TID   omega-3 acid ethyl esters  1 g Oral Daily   rosuvastatin  10 mg Oral Once per day on Monday Wednesday Friday   sodium chloride flush  3 mL Intravenous Q12H    Infusions:  sodium chloride     heparin 1,200 Units/hr (12/07/22 1400)    PRN Medications: sodium chloride, acetaminophen **OR** acetaminophen, calamine, fentaNYL (SUBLIMAZE) injection, ipratropium-albuterol, ondansetron **OR** ondansetron (ZOFRAN) IV, oxyCODONE, polyethylene glycol, sodium chloride flush, traZODone  Assessment/Plan   1. Acute on chronic diastolic CHF: With prominent RV failure.  Echo this admission with EF 55-60%, mild LVH, moderate RV enlargement with mildly decreased RV systolic function, PASP 73 mmHg, severe LAE, mild-moderate TR, IVC dilated.  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 With significant volume overload, he was started on milrinone for RV support (no PICC with CKD IV) and diuresed with Lasix gtt.  Initial good response but UOP fell off significantly and creatinine up to 4.2, started CVVH on 9/12.  RHC 9/11 showed both right and left sided failure but RV failure predominated with RA pressure > 20.  This is going to be very difficult to manage with associated cardiorenal syndrome.  - now off milrinone and CVVHD - family wants to hold off on iHD for now and assess for renal recovery - volume status looks ok today - will give torsemide 80 daily and assess response 2. AKI on CKD stage IV: Complicates CHF.  Creatinine 2.98 => 3.07=>3.2 => 3.42 => 3.33 => 4.2.  As above, difficult situation with severe predominantly RV failure/volume overload and cardiorenal syndrome.  Started on CVVH on 9/12 with UOP falling off despite high doses of diuretics  - Off hydralazine/Imdur to promote higher BP.  - Long discussion about role of HD going forward. I do not think he will do  well with iHD at a Center but may be better of with home HD or PD if he is a candidate. Currently he and his wife are hoping for renal recovery, at least in the short term and will re-discuss HD if he fails.  3.  Atrial fibrillation: Permanent. Reasonable rate control.  - Apixaban 2.5 bid on hold. Continue heparin 4. CAD: Remote PCI to LAD and diagonal.  .  - No s/s angina. Continue statin - No ASA with apixaban use.  5. 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   6. 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  He was started on amiodarone gtt, very rare PVCs now.  - improved off milrinone   Length of Stay: 11  Arvilla Meres, MD  12/07/2022, 2:22 PM  Advanced Heart Failure Team Pager  [phone] (M-F; 7a - 5p)  Please contact [facility] for night-coverage after hours (5p -7a ) and weekends on [email or web address]

## 2022-12-07 NOTE — TOC Progression Note (Signed)
Transition of Care [facility]) - Progression Note    Patient Details  Name: Kyle Roberson MRN: [id number] Date of Birth: 03/18/1941  [facility] CM/SW Contact  Chapman Fitch, RN Phone Number: 12/07/2022, 3:04 PM  Clinical Narrative:     Per MD hold off on discussing SNF options until patient is more medically stable   Expected Discharge Plan: Home/Self Care Barriers to Discharge: No Barriers Identified  Expected Discharge Plan and Services       Living arrangements for the past 2 months: Single Family Home                                       Social Determinants of Health (SDOH) Interventions SDOH Screenings   Food Insecurity: No Food Insecurity (12/01/2022)  Housing: Low Risk  (12/01/2022)  Transportation Needs: No Transportation Needs (12/01/2022)  Utilities: Not At Risk (12/01/2022)  Depression (PHQ2-9): Low Risk  (07/27/2022)  Tobacco Use: Medium Risk (11/26/2022)    Readmission Risk Interventions     No data to display

## 2022-12-08 DIAGNOSIS — I132 Hypertensive heart and chronic kidney disease with heart failure and with stage 5 chronic kidney disease, or end stage renal disease: Secondary | ICD-10-CM | POA: Diagnosis not present

## 2022-12-08 DIAGNOSIS — R570 Cardiogenic shock: Secondary | ICD-10-CM | POA: Diagnosis not present

## 2022-12-08 DIAGNOSIS — I5033 Acute on chronic diastolic (congestive) heart failure: Secondary | ICD-10-CM | POA: Diagnosis not present

## 2022-12-08 DIAGNOSIS — E8770 Fluid overload, unspecified: Secondary | ICD-10-CM | POA: Diagnosis not present

## 2022-12-08 LAB — CBC
HCT: 31.7 % — ABNORMAL LOW (ref 39.0–52.0)
HCT: 30 % — ABNORMAL LOW (ref 39.0–52.0)
Hemoglobin: 9.8 g/dL — ABNORMAL LOW (ref 13.0–17.0)
Hemoglobin: 9.4 g/dL — ABNORMAL LOW (ref 13.0–17.0)
MCH: 29.4 pg (ref 26.0–34.0)
MCH: 29.4 pg (ref 26.0–34.0)
MCHC: 30.9 g/dL (ref 30.0–36.0)
MCHC: 31.3 g/dL (ref 30.0–36.0)
MCV: 95.2 fL (ref 80.0–100.0)
MCV: 93.8 fL (ref 80.0–100.0)
Platelets: 162 10*3/uL (ref 150–400)
Platelets: 153 10*3/uL (ref 150–400)
RBC: 3.33 MIL/uL — ABNORMAL LOW (ref 4.22–5.81)
RBC: 3.2 MIL/uL — ABNORMAL LOW (ref 4.22–5.81)
RDW: 14.6 % (ref 11.5–15.5)
RDW: 14.4 % (ref 11.5–15.5)
WBC: 6 10*3/uL (ref 4.0–10.5)
WBC: 5.1 10*3/uL (ref 4.0–10.5)
nRBC: 0 % (ref 0.0–0.2)
nRBC: 0 % (ref 0.0–0.2)

## 2022-12-08 LAB — RENAL FUNCTION PANEL
Albumin: 3.2 g/dL — ABNORMAL LOW (ref 3.5–5.0)
Albumin: 3.2 g/dL — ABNORMAL LOW (ref 3.5–5.0)
Anion gap: 10 (ref 5–15)
Anion gap: 11 (ref 5–15)
BUN: 79 mg/dL — ABNORMAL HIGH (ref 8–23)
BUN: 70 mg/dL — ABNORMAL HIGH (ref 8–23)
CO2: 24 mmol/L (ref 22–32)
CO2: 25 mmol/L (ref 22–32)
Calcium: 8.6 mg/dL — ABNORMAL LOW (ref 8.9–10.3)
Calcium: 8.3 mg/dL — ABNORMAL LOW (ref 8.9–10.3)
Chloride: 102 mmol/L (ref 98–111)
Chloride: 99 mmol/L (ref 98–111)
Creatinine, Ser: 3.99 mg/dL — ABNORMAL HIGH (ref 0.61–1.24)
Creatinine, Ser: 3.39 mg/dL — ABNORMAL HIGH (ref 0.61–1.24)
GFR, Estimated: 14 mL/min — ABNORMAL LOW (ref >60)
GFR, Estimated: 17 mL/min — ABNORMAL LOW (ref >60)
Glucose, Bld: 151 mg/dL — ABNORMAL HIGH (ref 70–99)
Glucose, Bld: 167 mg/dL — ABNORMAL HIGH (ref 70–99)
Phosphorus: 6.6 mg/dL — ABNORMAL HIGH (ref 2.5–4.6)
Phosphorus: 5.3 mg/dL — ABNORMAL HIGH (ref 2.5–4.6)
Potassium: 4.3 mmol/L (ref 3.5–5.1)
Potassium: 3.9 mmol/L (ref 3.5–5.1)
Sodium: 136 mmol/L (ref 135–145)
Sodium: 135 mmol/L (ref 135–145)

## 2022-12-08 LAB — GLUCOSE, CAPILLARY
Glucose-Capillary: 160 mg/dL — ABNORMAL HIGH (ref 70–99)
Glucose-Capillary: 148 mg/dL — ABNORMAL HIGH (ref 70–99)
Glucose-Capillary: 140 mg/dL — ABNORMAL HIGH (ref 70–99)
Glucose-Capillary: 183 mg/dL — ABNORMAL HIGH (ref 70–99)
Glucose-Capillary: 180 mg/dL — ABNORMAL HIGH (ref 70–99)
Glucose-Capillary: 236 mg/dL — ABNORMAL HIGH (ref 70–99)

## 2022-12-08 LAB — HEPATITIS B SURFACE ANTIBODY, QUANTITATIVE: Hep B S AB Quant (Post): <3.5 m[IU]/mL — ABNORMAL LOW

## 2022-12-08 LAB — HEPARIN LEVEL (UNFRACTIONATED): Heparin Unfractionated: 0.51 [IU]/mL (ref 0.30–0.70)

## 2022-12-08 MED ORDER — ANTICOAGULANT SODIUM CITRATE 4% (200MG/5ML) IV SOLN: 5.0000 mL | Status: DC | PRN

## 2022-12-08 MED ORDER — LIDOCAINE HCL (PF) 1 % IJ SOLN: 5.0000 mL | INTRAMUSCULAR | Status: DC | PRN

## 2022-12-08 MED ORDER — ALTEPLASE 2 MG IJ SOLR: 2.0000 mg | Freq: Once | INTRAMUSCULAR | Status: DC | PRN

## 2022-12-08 MED ORDER — LIDOCAINE-PRILOCAINE 2.5-2.5 % EX CREA: 1.0000 | TOPICAL_CREAM | CUTANEOUS | Status: DC | PRN

## 2022-12-08 MED ORDER — PENTAFLUOROPROP-TETRAFLUOROETH EX AERO: 1.0000 | INHALATION_SPRAY | CUTANEOUS | Status: DC | PRN

## 2022-12-08 MED ORDER — HEPARIN SODIUM (PORCINE) 1000 UNIT/ML DIALYSIS: 1000.0000 [IU] | INTRAMUSCULAR | Status: DC | PRN

## 2022-12-08 MED ORDER — HEPARIN SODIUM (PORCINE) 1000 UNIT/ML DIALYSIS
1000.0000 [IU] | INTRAMUSCULAR | Status: DC | PRN
  Administered 2022-12-08: 2800 [IU]

## 2022-12-08 MED ORDER — HEPARIN SODIUM (PORCINE) 1000 UNIT/ML IJ SOLN
INTRAMUSCULAR | Status: AC
  Filled 2022-12-08: qty 10

## 2022-12-08 MED ORDER — NEPRO/CARBSTEADY PO LIQD: 237.0000 mL | ORAL | Status: DC | PRN

## 2022-12-08 NOTE — Progress Notes (Signed)
PT Cancellation Note  Patient Details Name: Kyle Roberson MRN: [id number] DOB: 1940/05/11   Cancelled Treatment:    Reason Eval/Treat Not Completed: Other (comment) (Spoke with the nurse. Patient has been up to chair this morning, just getting back to bed and about to start dialysis. PT will follow up next date as appropriate.)  Donna Bernard, PT, MPT   Ina Homes 12/08/2022, 1:52 PM

## 2022-12-08 NOTE — Progress Notes (Signed)
Palliative Care Progress Note, Assessment & Plan   Patient Name: Kyle Roberson       Date: 12/08/2022 DOB: 07-22-1940  Age: 82 y.o. MRN#: [id number] Attending Physician: Jonah Blue, MD Primary Care Physician: [name], MD Admit Date: 11/26/2022  Subjective: Patient is out of bed and sitting in the recliner.  He acknowledges my presence and is able to make his wishes known.  His wife Kyle Roberson is at bedside during my visit.  HPI: 82 y.o. male  with past medical history of CAD, CHF, HF PEF, PAH, type 2 diabetes, A-fib, SDH, CKD (stage IV), and bilateral lower extremity lymphedema admitted on 11/26/2022 with weight gain, increased lower extremity edema, and shortness of breath.   9/11 patient had right heart catheterization.  Patient being treated for acute on chronic diastolic CHF, AKI on CKD stage IV, pulmonary hypertension, and cardiorenal syndrome.    Temporary dialysis catheter placed for CRRT with UF initiated.  CRRT discontinued 9/14.  Summary of counseling/coordination of care: Extensive chart review completed prior to meeting patient including labs, vital signs, imaging, progress notes, orders, and available advanced directive documents from current and previous encounters.   After reviewing the patient's chart and assessing the patient at bedside, I spoke with patient and his wife Kyle Roberson in regards to symptom management and plan of care.  Symptoms assessed.  Kyle Roberson endorses patient becomes sweaty and currently itchy on his center back.  Discussed use of calamine lotion to minimize itching.  Encouraged mobility and movement to prevent sweating/skin breakdown/skin irritation.  Patient offered no other acute complaints at this time.  No adjustment to more needed.  In chart review, patient and  wife have reversed CODE STATUS.  I inquired as to reasoning behind reversing CODE STATUS.  Patient's wife shares that patient stated that he is fine with attempting cardiopulmonary resuscitation, even if it means breaking ribs and placing him on a ventilator.  She shares what he would never be accepting of his if he were brain-dead.  He says he would never want to be hooked up to a machine to keep him alive.  Discussed use of ventilatory support as an artificial means of sustaining his life.  Patient and wife were accepting of this.  Their boundary of medical treatment is that they have he is in a vegetative state and they would remove all means of artificially sustaining his life.  Full code full scope remain.  Discussed that patient will likely be transferred out of ICU but that close monitoring of his urine output for kidney function as well as heart failure will continue to be monitored.  Patient and wife in agreement for PMT to continue to follow and support.  Physical Exam Vitals reviewed.  Constitutional:      General: He is not in acute distress.    Appearance: He is obese.  HENT:     Head: Normocephalic.     Mouth/Throat:     Mouth: Mucous membranes are moist.  Eyes:     Pupils: Pupils are equal, round, and reactive to light.  Pulmonary:     Effort: Pulmonary effort is normal.  Abdominal:     Palpations: Abdomen is soft.  Musculoskeletal:  Comments: Generalized weakness  Skin:    General: Skin is warm and dry.     Coloration: Skin is pale.  Neurological:     Mental Status: He is alert and oriented to person, place, and time.  Psychiatric:        Mood and Affect: Mood normal.        Behavior: Behavior normal.        Thought Content: Thought content normal.        Judgment: Judgment normal.             Time spent includes: Detailed review of medical records (labs, imaging, vital signs), medically appropriate exam (mental status, respiratory, cardiac, skin), discussed  with treatment team, counseling and educating patient, family and staff, documenting clinical information, medication management and coordination of care.  Total Time 35 minutes   Kyle Roberson L. Bonita Quin, DNP, FNP-BC Palliative Medicine Team

## 2022-12-08 NOTE — Progress Notes (Signed)
  Informed consent signed and in chart.    TX duration: 2 hours     Hand-off given to patient's nurse.    Access used: L CVC Access issues: None   Total UF removed: 1000 mL Medication(s) given: None Post HD VS: WDL Post HD weight: 113.7 kg     [facility]

## 2022-12-08 NOTE — Consult Note (Signed)
ANTICOAGULATION CONSULT NOTE  Pharmacy Consult for IV Heparin Indication: atrial fibrillation  Patient Measurements: Height: 5\' 8"  (172.7 cm) Weight: 109.7 kg (241 lb 13.5 oz) IBW/kg (Calculated) : 68.4 Heparin Dosing Weight: 93 kg  Labs: Recent Labs    12/06/22 0317 12/06/22 1018 12/06/22 1217 12/06/22 1747 12/06/22 2209 12/07/22 0328 12/08/22 0604  HGB 9.5*  --   --   --   --  9.5* 9.8*  HCT 30.2*  --   --   --   --  30.2* 31.7*  PLT 175  --   --   --   --  165 162  APTT 62* 87*  --  78*  --  95*  --   LABPROT  --   --   --   --   --  15.6*  --   INR  --   --   --   --   --  1.2  --   HEPARINUNFRC 0.61  --   --   --   --  0.70 0.51  CREATININE 2.43*  --    < >  --  3.06* 3.21* 3.99*   < > = values in this interval not displayed.    Estimated Creatinine Clearance: 16.8 mL/min (A) (by C-G formula based on SCr of 3.99 mg/dL (H)).   Medical History: Past Medical History:  Diagnosis Date   AKI (acute kidney injury) (HCC) 07/06/2018   CAD (coronary artery disease)    a. Remote PCI/stenting to LAD w PTCA Diagnoal. RCA 60%; b.  2005/2008 Cardiolites w/ reportedly mild ischemia in Diag territory-->Med rx.   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 04/2019 Echo: EF 55-60%, no rwma, nl RV fxn, RVSP 55.45mmHg. Mod dil LA. Mild-mod MR. Mod dil PA.   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 04/2019 Echo: RVSP 55.53mmHg.   Permanent atrial fibrillation (HCC)    a. CHA2DS2VASc = 5-->dose adjusted eliquis (age/creat).   Subdural hematoma (HCC)    a. 01/2021 in setting of fall s/p L frontotemporal craniotomy.    Medications:  Apixaban 2.5 mg BID (last dose 9/12 at 1100)  Assessment: 82 y/o M  with PMH as above admitted with acute on chronic diastolic CHF / RV failure complicated by CKD / cardiorenal syndrome. Patient is now being started on CRRT. Pharmacy consulted to transition apixaban to heparin for now in case further procedures required.  Baseline aPTT 52 (elevated) and INR 1.6 (elevated). Baseline CBC notable for stable anemia.  Goal of Therapy:  Heparin level 0.3-0.7 units/ml aPTT 66 - 102 seconds Monitor platelets by anticoagulation protocol: Yes   Date/time aPTT  HL  Comments 9/15@1018  87  --  Therapeutic x 1 9/15@1747  78  --  Therapeutic x 2 @ 1200 un/hr 9/16@0328      95                   0.7                   Therapeutic X 3 9/17 0604 --  0.51  Therapeutic x 4  Plan:  - Therapeutic X 4  -  Recheck HL daily w/ AM  labs while therapeutic --Daily CBC per protocol while on IV heparin  Otelia Sergeant, PharmD, [facility] 12/08/2022 6:56 AM

## 2022-12-08 NOTE — Progress Notes (Signed)
Patient ID: Kyle Roberson, male   DOB: 1941-03-22, 82 y.o.   MRN: [id number]     Advanced Heart Failure Rounding Note  PCP-Cardiologist: Julien Nordmann, MD   Subjective:    Patient was started on CVVH due to worsening renal function and intractable volume overload.   CVVHD stopped over the weekend. Plans were for iHD yesterday but patient now making some urine and patient and wife would like to hold off to see if he will have renal recovery.  Received torsemide 80 po yesterday.  55o urine output  Weight up 1.5kg  More lethargic today. Audible wheezing  Scr 3.2 ->4.0 BUN 59 -> 79   Objective:   Weight Range: 109.7 kg Body mass index is 36.77 kg/m.   Vital Signs:   Temp:  [97.8 F (36.6 C)-98.6 F (37 C)] 97.8 F (36.6 C) (09/17 0800) Pulse Rate:  [28-60] 42 (09/17 0800) Resp:  [17-33] 22 (09/17 0800) BP: (127-150)/(48-65) 137/48 (09/17 0800) SpO2:  [91 %-100 %] 100 % (09/17 0800) Last BM Date : 12/07/22  Weight change: Filed Weights   12/04/22 0156 12/05/22 0124 12/06/22 1400  Weight: 108.6 kg 108.3 kg 109.7 kg   Intake/Output:   Intake/Output Summary (Last 24 hours) at 12/08/2022 0936 Last data filed at 12/08/2022 0600 Gross per 24 hour  Intake 251.48 ml  Output 550 ml  Net -298.52 ml    Physical Exam   General:  Sitting up in chair. Lethargic  HEENT: normal injected sclerae Neck: supple. JVP to jaw. With prominent v-waves  WUJ:WJXBJ 2/6 TR Lungs: + wheeze Abdomen: obese soft, nontender, nondistended. No hepatosplenomegaly. No bruits or masses. Good bowel sounds. Extremities: no cyanosis, clubbing, rash, severe venous stasis dermatitis likely 1+ edema Neuro: lethargic but arousable    Telemetry   Atrial fibrillation rate 50-60s, (personally reviewed)  Labs    CBC Recent Labs    12/07/22 0328 12/08/22 0604  WBC 5.4 6.0  HGB 9.5* 9.8*  HCT 30.2* 31.7*  MCV 94.4 95.2  PLT 165 162   Basic Metabolic Panel Recent Labs    47/82/95 2209  12/07/22 0328 12/08/22 0604  NA 134* 133* 136  K 4.3 4.3 4.3  CL 98 99 102  CO2 25 25 24   GLUCOSE 195* 107* 151*  BUN 63* 59* 79*  CREATININE 3.06* 3.21* 3.99*  CALCIUM 8.5* 8.5* 8.6*  MG 2.6* 2.5*  --   PHOS  --  5.1* 6.6*   Liver Function Tests Recent Labs    12/07/22 0328 12/08/22 0604  ALBUMIN 3.2* 3.2*   No results for input(s): "LIPASE", "AMYLASE" in the last 72 hours. Cardiac Enzymes No results for input(s): "CKTOTAL", "CKMB", "CKMBINDEX", "TROPONINI" in the last 72 hours.  BNP: BNP (last 3 results) Recent Labs    06/22/22 1613 08/24/22 1559 11/26/22 1555  BNP 171.0* 170.1* 238.6*    ProBNP (last 3 results) No results for input(s): "PROBNP" in the last 8760 hours.   D-Dimer No results for input(s): "DDIMER" in the last 72 hours. Hemoglobin A1C No results for input(s): "HGBA1C" in the last 72 hours. Fasting Lipid Panel No results for input(s): "CHOL", "HDL", "LDLCALC", "TRIG", "CHOLHDL", "LDLDIRECT" in the last 72 hours. Thyroid Function Tests No results for input(s): "TSH", "T4TOTAL", "T3FREE", "THYROIDAB" in the last 72 hours.  Invalid input(s): "FREET3"  Other results:   Imaging   No results found.  Medications:   Scheduled Medications:  (feeding supplement) PROSource Plus  30 mL Oral BID BM   vitamin C  500  mg Oral BID   Chlorhexidine Gluconate Cloth  6 each Topical Daily   cholecalciferol  2,000 Units Oral Daily   docusate sodium  100 mg Oral BID   doxazosin  1 mg Oral BID   feeding supplement (NEPRO CARB STEADY)  237 mL Oral TID BM   insulin aspart  0-15 Units Subcutaneous TID AC & HS   insulin aspart  3 Units Subcutaneous TID WC   multivitamin  1 tablet Oral QHS   neomycin-polymyxin b-dexamethasone  1 Application Right Eye TID   omega-3 acid ethyl esters  1 g Oral Daily   rosuvastatin  10 mg Oral Once per day on Monday Wednesday Friday   sodium chloride flush  3 mL Intravenous Q12H    Infusions:  sodium chloride     heparin  1,200 Units/hr (12/08/22 0845)    PRN Medications: sodium chloride, acetaminophen **OR** acetaminophen, calamine, fentaNYL (SUBLIMAZE) injection, ipratropium-albuterol, ondansetron **OR** ondansetron (ZOFRAN) IV, oxyCODONE, polyethylene glycol, sodium chloride flush, traZODone  Assessment/Plan   1. Acute on chronic diastolic CHF: With prominent RV failure.  Echo this admission with EF 55-60%, mild LVH, moderate RV enlargement with mildly decreased RV systolic function, PASP 73 mmHg, severe LAE, mild-moderate TR, IVC dilated.  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 With significant volume overload, he was started on milrinone for RV support (no PICC with CKD IV) and diuresed with Lasix gtt.  Initial good response but UOP fell off significantly and creatinine up to 4.2, started CVVH on 9/12.  RHC 9/11 showed both right and left sided failure but RV failure predominated with RA pressure > 20.  This is going to be very difficult to manage with associated cardiorenal syndrome.  - now off milrinone and CVVHD - family wants to hold off on iHD for now and assess for renal recovery - volume status and renal function worsening. I do not think we will be able to manage his HF successfully without dialysis and I explained this to him and his wife. Would resume HD. D/w Renal  2. AKI on CKD stage IV: Complicates CHF.   As above, difficult situation with severe predominantly RV failure/volume overload and cardiorenal syndrome.  Started on CVVH on 9/12 with UOP falling off despite high doses of diuretics  - Off hydralazine/Imdur to promote higher BP.  - Scr and volue status as above.  - As above, I think he will need HD today for volume control and uremia. D/w him and wife as well as Renal. - Long discussion about role of HD going forward. I do not think he will do well with iHD at a Center but may be better of with home HD or PD if he is a candidate.  3.  Atrial fibrillation: Permanent.  Reasonable rate control.  - Apixaban 2.5 bid on hold. Continue heparin - having bradycardia. May eventually need PPM 4. CAD: Remote PCI to LAD and diagonal.  .  - No s/s angina Continue statin - No ASA with apixaban use.  5. 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   6. 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  He was started on amiodarone gtt, very rare PVCs now.  - improved off milrinone   Length of Stay: 12  Arvilla Meres, MD  12/08/2022, 9:36 AM  Advanced Heart Failure Team Pager [phone] (M-F; 7a - 5p)  Please contact [facility] for night-coverage after hours (5p -7a ) and weekends on [email or web address]

## 2022-12-08 NOTE — Progress Notes (Addendum)
0800 Patient sleeping soundly. Wife with patient. 0900 Ate good breakfast. 1000 Transferred from bed to chair. 1200 Repositioned in the chair. 1330 Transferred from chair to bed and cleaned of large BM. 1400 Dropping oxygen saturation. Attempted to place oxygen nasal cannula back on patient. Patient screamed "G** Da** you! Im going to knock the sh** [id number] of you." Explained why he needed oxygen, then attempted to place oxygen on patient's face. "GD I told you to get away from me or I'm gonna knock the Sh** out of you!! Get away from me" 1500 Dialysis nurse in to start treatment.

## 2022-12-08 NOTE — Progress Notes (Signed)
[facility]  ROUNDING NOTE   Subjective:   Patient sitting up in bed Alert but drowsy Wife at bedside Room air  UOP with incontinent episodes    Objective:  Vital signs in last 24 hours:  Temp:  [97.8 F (36.6 C)-98.6 F (37 C)] 97.8 F (36.6 C) (09/17 0800) Pulse Rate:  [28-60] 48 (09/17 0900) Resp:  [17-33] 23 (09/17 0900) BP: (129-150)/(48-65) 130/58 (09/17 0900) SpO2:  [91 %-100 %] 99 % (09/17 0900)  Weight change:  Filed Weights   12/04/22 0156 12/05/22 0124 12/06/22 1400  Weight: 108.6 kg 108.3 kg 109.7 kg    Intake/Output: I/O last 3 completed shifts: In: 431.4 [I.V.:431.4] Out: 950 [Urine:950]   Intake/Output this shift:  No intake/output data recorded.  Physical Exam: General: Ill appearing  Head: Normocephalic, atraumatic. Moist oral mucosal membranes  Eyes: Anicteric  Neck: Supple  Lungs:  Clear to auscultation  Heart: bradycardia  Abdomen:  Soft, nontender  Extremities:  ++ peripheral edema.  Neurologic: Nonfocal, moving all four extremities  Skin: No lesions  Access: LIJ temp HD catheter    Basic Metabolic Panel: Recent Labs  Lab 12/04/22 0427 12/04/22 1558 12/05/22 0259 12/05/22 1832 12/06/22 0317 12/06/22 1217 12/06/22 2209 12/07/22 0328 12/08/22 0604  NA 135   < > 135 134* 134* 134* 134* 133* 136  K 4.0   < > 4.2 4.1 4.3 4.0 4.3 4.3 4.3  CL 98   < > 99 100 100 98 98 99 102  CO2 26   < > 26 26 25 25 25 25 24   GLUCOSE 213*   < > 172* 216* 118* 156* 195* 107* 151*  BUN 64*   < > 41* 45* 51* 56* 63* 59* 79*  CREATININE 2.79*   < > 1.90* 2.22* 2.43* 2.73* 3.06* 3.21* 3.99*  CALCIUM 8.1*   < > 8.0* 8.3* 8.2* 8.5* 8.5* 8.5* 8.6*  MG 2.3  --  2.6*  --  2.7*  --  2.6* 2.5*  --   PHOS 3.1   < > 2.2* 2.7 3.9 3.9  --  5.1* 6.6*   < > = values in this interval not displayed.    Liver Function Tests: Recent Labs  Lab 12/05/22 1832 12/06/22 0317 12/06/22 1217 12/07/22 0328 12/08/22 0604  ALBUMIN 3.3* 3.3* 3.1*  3.2* 3.2*   No results for input(s): "LIPASE", "AMYLASE" in the last 168 hours. No results for input(s): "AMMONIA" in the last 168 hours.  CBC: Recent Labs  Lab 12/03/22 0220 12/04/22 0427 12/05/22 0259 12/06/22 0317 12/07/22 0328 12/08/22 0604  WBC 7.3 6.7 6.9 7.0 5.4 6.0  NEUTROABS 5.8  --   --   --   --   --   HGB 9.5* 9.2* 9.4* 9.5* 9.5* 9.8*  HCT 30.1* 29.1* 30.0* 30.2* 30.2* 31.7*  MCV 94.7 93.0 94.6 93.5 94.4 95.2  PLT 170 169 171 175 165 162    Cardiac Enzymes: No results for input(s): "CKTOTAL", "CKMB", "CKMBINDEX", "TROPONINI" in the last 168 hours.  BNP: Invalid input(s): "POCBNP"  CBG: Recent Labs  Lab 12/07/22 1517 12/07/22 1926 12/08/22 0107 12/08/22 0404 12/08/22 0729  GLUCAP 163* 135* 160* 148* 140*    Microbiology: Results for orders placed or performed during the hospital encounter of 11/26/22  MRSA Next Gen by PCR, Nasal     Status: None   Collection Time: 11/30/22  4:10 PM   Specimen: Nasal Mucosa; Nasal Swab  Result Value Ref Range Status   MRSA  by PCR Next Gen NOT DETECTED NOT DETECTED Final    Comment: (NOTE) The GeneXpert MRSA Assay (FDA approved for NASAL specimens only), is one component of a comprehensive MRSA colonization surveillance program. It is not intended to diagnose MRSA infection nor to guide or monitor treatment for MRSA infections. Test performance is not FDA approved in patients less than 86 years old. Performed at [facility], [address] [place], [place] [address]     Coagulation Studies: Recent Labs    12/07/22 0328  LABPROT 15.6*  INR 1.2    Urinalysis: No results for input(s): "COLORURINE", "LABSPEC", "PHURINE", "GLUCOSEU", "HGBUR", "BILIRUBINUR", "KETONESUR", "PROTEINUR", "UROBILINOGEN", "NITRITE", "LEUKOCYTESUR" in the last 72 hours.  Invalid input(s): "APPERANCEUR"    Imaging: No results found.   Medications:    sodium chloride     heparin 1,200 Units/hr (12/08/22 0845)     (feeding supplement) PROSource Plus  30 mL Oral BID BM   vitamin C  500 mg Oral BID   Chlorhexidine Gluconate Cloth  6 each Topical Daily   cholecalciferol  2,000 Units Oral Daily   docusate sodium  100 mg Oral BID   doxazosin  1 mg Oral BID   feeding supplement (NEPRO CARB STEADY)  237 mL Oral TID BM   insulin aspart  0-15 Units Subcutaneous TID AC & HS   insulin aspart  3 Units Subcutaneous TID WC   multivitamin  1 tablet Oral QHS   neomycin-polymyxin b-dexamethasone  1 Application Right Eye TID   omega-3 acid ethyl esters  1 g Oral Daily   rosuvastatin  10 mg Oral Once per day on Monday Wednesday Friday   sodium chloride flush  3 mL Intravenous Q12H   sodium chloride, acetaminophen **OR** acetaminophen, calamine, fentaNYL (SUBLIMAZE) injection, ipratropium-albuterol, ondansetron **OR** ondansetron (ZOFRAN) IV, oxyCODONE, polyethylene glycol, sodium chloride flush, traZODone  Assessment/ Plan:  [name] is a 82 y.o.  male with diabetes mellitus type II, hypertension, coronary artery disease, chronic diastolic congestive heart failure, atrial fibrillation, pulmonary hypertension, and historyof subdural hematoma who is admitted on 11/26/2022 for Acute on chronic diastolic CHF (congestive heart failure) (HCC) [I50.33] Hypervolemia, unspecified hypervolemia type [E87.70]  Acute Kidney Injury on chronic kidney disease stage IV with proteinuria. Baseline creatinine of 3.24, GFR of 18. 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 CRRT from 9/12 to 9/13 - Will continue to hold dialysis per family request - Discussed with patient and spouse, that renal function remains poor.  - Interested in home modalities of dialysis, patient too unstable at this time. Will address at later date.   Acute exacerbation of chronic diastolic congestive heart failure: complicated with acute respiratory failure and pulmonary hypertension. Currently with junctional rhythm. - Room air - Will manage fluid  with diuresis. - Defer to cardiology  Diabetes mellitus type II with chronic kidney disease - holding empagliflozin - Glucose well controlled.    LOS: 12 Kyle Roberson 9/17/202411:18 AM

## 2022-12-08 NOTE — Progress Notes (Signed)
Progress Note   Patient: Kyle Roberson YSA:[id number] DOB: 1940/10/29 DOA: 11/26/2022     12 DOS: the patient was seen and examined on 12/08/2022   Brief hospital course: 82yo with h/o CAD, stage 5 CKD, chronic diastolic CHF, BLE lymphedema, afib on Eliquis, PAH, and SDH presented on 9/5 with weight gain and SOB.  He was diagnosed with acute on chronic CHF and started on Lasix drip.  Cardiology is consulting and started IV midodrine drip.  RHC done on 9/11 with worsening renal function and decreased UOP so Lasix stopped.  Considering CVVHD but this is likely to lead to HD dependence.  He was taken off CRRT and family has decided he will not proceed with HD.  He is on IV Lasix.  Assessment and Plan:  Acute on chronic diastolic congestive heart failure Pulmonary HTN Presented with volume overload with increasing weight up to 255 pounds. Dry weight around ~220 pounds Echo with preserved EF, indeterminate diastolic function, severe pulmonary HTN Cardiology consulting Received Lasix 60 mg IV once -> Lasix drip per nephrology/cardiology, was on 15 mg/hr but this was held due to worsening renal function Monitor input output and creatinine He does NOT wear oxygen at home, per wife Started on Farxiga transiently per Cardiology - this medication was stopped Transferred to ICU on 9/9 for IV milrinone gtt per [facility] heart failure MD.  Metolazone x1 RHC on 9/11 with elevated R > L filling pressures, mostly right-sided Overall poor prognosis given advanced renal disease Now off CRRT and seemingly stable for transfer to progressive care   Cardiorenal syndrome Baseline stage 5 CKD Significantly worse leading to transient CRRT Nephrology consulting IV albumin per Nephrology Concern for need for chronic HD, renal function is stable but mildly worst today since stopping CRRT Overall very poor prognosis and patient is opposed to HD but his wife is insistent that he will change his mind; palliative care  consult declined by his wife for the last 2 days   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 Recent A1c 7.3 Holding home glipizide Wife requested dc of SSI but this is added back to improve glycemic control   L ankle pain Fell in the ER Xray ordered and negative Will order CAM walker for use with ambulation   Morbid obesity Complicates overall prognosis Body mass index is 36.5 kg/m.Marland Kitchen  Weight loss should be encouraged Outpatient PCP/bariatric medicine f/u encouraged  Nutrition Problem: Increased nutrient needs Etiology: acute illness (CRRT) Signs/Symptoms: estimated needs   Pressure ulcer  Pressure Injury 12/03/22 Buttocks Right;Left Stage 1 -  Intact skin with non-blanchable redness of a localized area usually over a bony prominence. Purple/blue (Active)  12/03/22 0820  Location: Buttocks  Location Orientation: Right;Left  Staging: Stage 1 -  Intact skin with non-blanchable redness of a localized area usually over a bony prominence.  Wound Description (Comments): Purple/blue  Present on Admission:     Hypertension Continue doxazosin, hydralazine   Hyperlipidemia Continue rosuvastatin, Lovaza   CAD with remote PCI Continue Imdur No current c/o CP   History of paroxysmal a fib Amiodarone drip   Goals of care Patient appears disinterested in aggressive care but his wife is insistent She has declined palliative care consultation, "he's not there yet" He has a very poor overall prognosis based on cardiorenal failure and is not a good long-term HD candidate Ongoing discussions are encouraged regarding code status as well as possibly EOL care       Consultants: Cardiology Nephrology  PCCM PT/OT TOC team   Procedures: Echocardiogram RHC on 9/11   Antibiotics: None    30 Day Unplanned Readmission Risk Score    Flowsheet Row ED to Hosp-Admission (Current) from 11/26/2022 in [facility]U  30 Day  Unplanned Readmission Risk Score (%) 31.64 Filed at 12/08/2022 0801       This score is the patient's risk of an unplanned readmission within 30 days of being discharged (0 -100%). The score is based on dignosis, age, lab data, medications, orders, and past utilization.   Low:  0-14.9   Medium: 15-21.9   High: 22-29.9   Extreme: 30 and above           Subjective: Making good UOP per wife, she is optimistic that he will not need long-term HD.  No new complaints.   Objective: Vitals:   12/08/22 1530 12/08/22 1545  BP: 136/69 (!) 131/53  Pulse: (!) 45 (!) 50  Resp: (!) 23 (!) 27  Temp:    SpO2: 92% 91%    Intake/Output Summary (Last 24 hours) at 12/08/2022 1553 Last data filed at 12/08/2022 1136 Gross per 24 hour  Intake 501.53 ml  Output 450 ml  Net 51.53 ml   Filed Weights   12/04/22 0156 12/05/22 0124 12/06/22 1400  Weight: 108.6 kg 108.3 kg 109.7 kg    Exam:  General:  Appears calm and comfortable and is in NAD, chronically ill appearing Eyes:  prominent hyperemic lower eyelids ENT:  grossly normal hearing, lips & tongue, mmm Neck:  no LAD, masses or thyromegaly; improved JVD Cardiovascular:  RRR Respiratory:   CTA bilaterally with no wheezes/rales/rhonchi.  Normal to mildly increased respiratory effort. Abdomen:  soft, NT, ND Skin:  chronic lymphedema Musculoskeletal:  no bony abnormality Psychiatric:  blunted mood and affect, speech limited but appropriate Neurologic:  grossly normal CN   Data Reviewed: I have reviewed the patient's lab results since admission.  Pertinent labs for today include:   Glucose 151 BUN 79/Creatinine 3.99/GFR 14; 59/3.21/18 on 9/16 WBC 6 Hgb 9.8     Family Communication: Wife was present throughout evaluation  Disposition: Status is: Inpatient Remains inpatient appropriate because: cardiorenal syndrome     Total critical care time: 55 minutes Critical care time was exclusive of separately billable procedures and  treating other patients. Critical care was necessary to treat or prevent imminent or life-threatening deterioration. Critical care was time spent personally by me on the following activities: development of treatment plan with patient and/or surrogate as well as nursing, discussions with consultants, evaluation of patient's response to treatment, examination of patient, obtaining history from patient or surrogate, ordering and performing treatments and interventions, ordering and review of laboratory studies, ordering and review of radiographic studies, pulse oximetry and re-evaluation of patient's condition.   Unresulted Labs (From admission, onward)     Start     Ordered   12/09/22 0500  Heparin level (unfractionated)  Tomorrow morning,   R       Question:  Specimen collection method  Answer:  Unit=Unit collect   12/08/22 0657   12/09/22 0500  CBC  Tomorrow morning,   R       Question:  Specimen collection method  Answer:  Unit=Unit collect   12/08/22 0657   12/08/22 1546  Renal function panel  Once,   R       Question:  Specimen collection method  Answer:  Unit=Unit collect   12/08/22 1545   12/08/22 1545  CBC  Once,   R       Question:  Specimen collection method  Answer:  Unit=Unit collect   12/08/22 1545   12/04/22 0500  Renal function panel (daily at 0500)  Daily,   R     Question:  Specimen collection method  Answer:  Lab=Lab collect   12/03/22 1211             Author: Jonah Blue, MD 12/08/2022 3:53 PM  For on call review [email or web address].

## 2022-12-09 DIAGNOSIS — N186 End stage renal disease: Secondary | ICD-10-CM

## 2022-12-09 DIAGNOSIS — I5033 Acute on chronic diastolic (congestive) heart failure: Secondary | ICD-10-CM | POA: Diagnosis not present

## 2022-12-09 DIAGNOSIS — Z992 Dependence on renal dialysis: Secondary | ICD-10-CM

## 2022-12-09 LAB — RENAL FUNCTION PANEL
Albumin: 3.1 g/dL — ABNORMAL LOW (ref 3.5–5.0)
Anion gap: 11 (ref 5–15)
BUN: 68 mg/dL — ABNORMAL HIGH (ref 8–23)
CO2: 25 mmol/L (ref 22–32)
Calcium: 8.4 mg/dL — ABNORMAL LOW (ref 8.9–10.3)
Chloride: 99 mmol/L (ref 98–111)
Creatinine, Ser: 3.57 mg/dL — ABNORMAL HIGH (ref 0.61–1.24)
GFR, Estimated: 16 mL/min — ABNORMAL LOW (ref >60)
Glucose, Bld: 132 mg/dL — ABNORMAL HIGH (ref 70–99)
Phosphorus: 5.5 mg/dL — ABNORMAL HIGH (ref 2.5–4.6)
Potassium: 3.9 mmol/L (ref 3.5–5.1)
Sodium: 135 mmol/L (ref 135–145)

## 2022-12-09 LAB — GLUCOSE, CAPILLARY
Glucose-Capillary: 129 mg/dL — ABNORMAL HIGH (ref 70–99)
Glucose-Capillary: 125 mg/dL — ABNORMAL HIGH (ref 70–99)
Glucose-Capillary: 244 mg/dL — ABNORMAL HIGH (ref 70–99)

## 2022-12-09 MED ORDER — HEPARIN SODIUM (PORCINE) 1000 UNIT/ML IJ SOLN
INTRAMUSCULAR | Status: AC
  Filled 2022-12-09: qty 10

## 2022-12-09 MED ORDER — HYDROCORTISONE 0.5 % EX CREA
TOPICAL_CREAM | CUTANEOUS | Status: DC | PRN
  Filled 2022-12-09: qty 28.35

## 2022-12-09 NOTE — Progress Notes (Addendum)
PT Cancellation Note  Patient Details Name: Jontel Yeager MRN: [id number] DOB: 1941-03-18   Cancelled Treatment:     PT attempt. Pt just left floor for dialysis . Author will return this afternoon. Acute PT will continue to follow per current POC.  Author returned after HD and pt was sleeping. Spouse requested to let him rest this afternoon. Per chart, he has been bradycardic throughout the day.     Rushie Chestnut 12/09/2022, 9:14 AM

## 2022-12-09 NOTE — Progress Notes (Signed)
Nutrition Follow-up  DOCUMENTATION CODES:   Obesity unspecified  INTERVENTION:   -Continue Nepro Shake po TID, each supplement provides 425 kcal and 19 grams protein -Continue Magic cup TID with meals, each supplement provides 290 kcal and 9 grams of protein -Continue Rena-vit po daily  -Continue Vitamin C 500mg  po BID -Continue daily weights  -Continue 2 gram sodium diet  NUTRITION DIAGNOSIS:   Increased nutrient needs related to acute illness (CRRT) as evidenced by estimated needs.  Ongoing  GOAL:   Patient will meet greater than or equal to 90% of their needs  Progressing   MONITOR:   PO intake, Supplement acceptance, Labs, Weight trends, I & O's, Skin  REASON FOR ASSESSMENT:   Consult Assessment of nutrition requirement/status  ASSESSMENT:   82 y/o male with h/o CHF, DM, CKD IV, HTN, CAD, BPH, GERD, SDH s/p craniotomy, hernia s/p repair, Afib, lymphaedema, HLD, depression and pulmonary hypertension who is admitted with AKI and volume overload requiring CRRT.  9/12- CRRT initiated 9/14- CRRT d/c 9/16- iHD held due to signs of renal recovery 9/17- iHD  Reviewed I/O's: -932 ml x 24 hours and -15.9 L since admission  UOP: 750 ml x 24 hours   Pt attempted to see pt x 2, but down in HD suite.   Pt remains on a 2 gram sodium diet. Noted meal completions 50-100%. Noted Prosource d/c. Pt with variable acceptance of supplements.   Wt has been stable since admission. Noted pt with edema, which may be masking true weight loss. -15.9 L since admission.   Palliative care following; plan continued full scope care. Pt and wife optimistic that pt will not require long term HD. Per nephrology notes, pt will need tunneled catheter and outpatient HD. Pt will require HD today secondary to uremia.   Medications reviewed and include vitamin D3, colace, and lovaza.    Lab Results  Component Value Date   HGBA1C 7.3 (H) 06/22/2022   PTA DM medications are .   Labs reviewed:  CBGS: 129-236 (inpatient orders for glycemic control are 0-15 units insulin aspart TID with meals and bedtime and 3 units insulin aspart TID with meals).    Diet Order:   Diet Order             Diet 2 gram sodium Room service appropriate? Yes; Fluid consistency: Thin  Diet effective now                   EDUCATION NEEDS:   Education needs have been addressed  Skin:  Skin Assessment: Skin Integrity Issues: Skin Integrity Issues:: Stage I Stage I: rt and lt buttocks  Last BM:  12/09/22  Height:   Ht Readings from Last 1 Encounters:  11/26/22 5\' 8"  (1.727 m)    Weight:   Wt Readings from Last 1 Encounters:  12/08/22 113.7 kg    Ideal Body Weight:  70 kg  BMI:  Body mass index is 38.11 kg/m.  Estimated Nutritional Needs:   Kcal:  2000-2300kcal/day  Protein:  100-115g/day  Fluid:  UOP +1L    Levada Schilling, RD, LDN, CDCES Registered Dietitian II Certified Diabetes Care and Education Specialist Please refer to AMION for RD and/or RD on-call/weekend/after hours pager

## 2022-12-09 NOTE — Progress Notes (Signed)
Progress Note   Patient: Kyle Roberson VHQ:[id number] DOB: 1940-04-06 DOA: 11/26/2022     13 DOS: the patient was seen and examined on 12/09/2022   Brief hospital course: 82yo with h/o CAD, stage 5 CKD, chronic diastolic CHF, BLE lymphedema, afib on Eliquis, PAH, and SDH presented on 9/5 with weight gain and SOB.  He was diagnosed with acute on chronic CHF and started on Lasix drip.  Cardiology is consulting and started IV midodrine drip.  RHC done on 9/11 with worsening renal function and decreased UOP so Lasix stopped.  Considering CVVHD but this is likely to lead to HD dependence.  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  He is on IV Lasix.  Assessment and Plan:  Acute on chronic diastolic congestive heart failure Pulmonary HTN Presented with volume overload with increasing weight up to 255 pounds. Dry weight around ~220 pounds Echo with preserved EF, indeterminate diastolic function, severe pulmonary HTN Cardiology consulting Received Lasix 60 mg IV once -> Lasix drip per nephrology/cardiology, was on 15 mg/hr but this was held due to worsening renal function Monitor input output and creatinine He does NOT wear oxygen at home, per wife Started on Farxiga transiently per Cardiology - this medication was stopped Transferred to ICU on 9/9 for IV milrinone gtt per [facility] heart failure MD.  Metolazone x1 RHC on 9/11 with elevated R > L filling pressures, mostly right-sided Started on CRRT for additional volume removal and completed this Back on IV Lasix Overall poor prognosis given advanced renal disease   Cardiorenal syndrome Baseline stage 5 CKD, has now progressed to ESRD Significantly worse leading to transient CRRT but failed after this was stopped, likely to need lifelong HD if he is able to tolerate it Nephrology consulting IV albumin per Nephrology Overall very poor prognosis Vascular surgery is willing to see  him as an outpatient in a few weeks to see if he improves enough to tolerate surgical line placement   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 Recent A1c 7.3 Holding home glipizide Wife requested dc of SSI but this was added back to improve glycemic control   L ankle pain Fell in the ER Xray ordered and negative Will order CAM walker for use with ambulation   Morbid obesity Complicates overall prognosis Body mass index is 36.5 kg/m.Marland Kitchen  Weight loss should be encouraged Outpatient PCP/bariatric medicine f/u encouraged  Nutrition Problem: Increased nutrient needs Etiology: acute illness (CRRT) Signs/Symptoms: estimated needs  Pressure ulcer  Pressure Injury 12/03/22 Buttocks Right;Left Stage 1 -  Intact skin with non-blanchable redness of a localized area usually over a bony prominence. Purple/blue (Active)  12/03/22 0820  Location: Buttocks  Location Orientation: Right;Left  Staging: Stage 1 -  Intact skin with non-blanchable redness of a localized area usually over a bony prominence.  Wound Description (Comments): Purple/blue  Present on Admission:     Hypertension Continue doxazosin, hydralazine   Hyperlipidemia Continue rosuvastatin, Lovaza   CAD with remote PCI Continue Imdur No current c/o CP   History of paroxysmal a fib Bradycardia currently so not on rate controlling medications On heparin drip for now, resume Eliquis when appropriate   Goals of care Patient appears disinterested in aggressive care but his wife is insistent She has declined palliative care consultation, "he's not there yet" He has a very poor overall prognosis based on cardiorenal failure and is not a good long-term HD  candidate Ongoing discussions are encouraged regarding code status as well as possibly EOL care       Consultants: Cardiology Nephrology PCCM PT/OT [facility] team   Procedures: Echocardiogram RHC on 9/11   Antibiotics: None   30 Day  Unplanned Readmission Risk Score    Flowsheet Row ED to Hosp-Admission (Current) from 11/26/2022 in [facility]  30 Day Unplanned Readmission Risk Score (%) 33.68 Filed at 12/09/2022 0801       This score is the patient's risk of an unplanned readmission within 30 days of being discharged (0 -100%). The score is based on dignosis, age, lab data, medications, orders, and past utilization.   Low:  0-14.9   Medium: 15-21.9   High: 22-29.9   Extreme: 30 and above           Subjective: Seen in HD.  He is still uncertain about his willingness to do this short-term or long-term but is willing today.  No specific complaints.   Objective: Vitals:   12/09/22 1230 12/09/22 1241  BP: (!) 129/47 (!) 124/57  Pulse: (!) 46 (!) 47  Resp: (!) 27 (!) 24  Temp:  98.6 F (37 C)  SpO2: 100% 99%    Intake/Output Summary (Last 24 hours) at 12/09/2022 1503 Last data filed at 12/09/2022 1442 Gross per 24 hour  Intake 564.08 ml  Output 3250 ml  Net -2685.92 ml   Filed Weights   12/06/22 1400 12/08/22 1500 12/08/22 1749  Weight: 109.7 kg 114.7 kg 113.7 kg    Exam:  General:  Appears calm and comfortable and is in NAD, chronically ill appearing Eyes:  prominent hyperemic lower eyelids ENT:  grossly normal hearing, lips & tongue, mmm Neck:  no LAD, masses or thyromegaly; improved JVD Cardiovascular:  RRR Respiratory:   CTA bilaterally with no wheezes/rales/rhonchi.  Normal to mildly increased respiratory effort. Abdomen:  soft, NT, ND Skin:  chronic lymphedema Musculoskeletal:  no bony abnormality Psychiatric:  blunted mood and affect, speech limited but appropriate Neurologic:  grossly normal CN   Data Reviewed: I have reviewed the patient's lab results since admission.  Pertinent labs for today include:   Glucose 132 BUN 68/Creatinine 3.57/GFR 16, slightly worse WBC 6.1 Hgb 9.5, stable   Family Communication: Saw wife in the room earlier and patient alone in  HD  Disposition: Status is: Inpatient Remains inpatient appropriate because: ongoing treatment planning     Time spent: 50 minutes  Unresulted Labs (From admission, onward)     Start     Ordered   12/10/22 0500  Heparin level (unfractionated)  Tomorrow morning,   R       Question:  Specimen collection method  Answer:  Unit=Unit collect   12/09/22 0504   12/10/22 0500  CBC  Tomorrow morning,   R       Question:  Specimen collection method  Answer:  Unit=Unit collect   12/09/22 0504   12/04/22 0500  Renal function panel (daily at 0500)  Daily,   R     Question:  Specimen collection method  Answer:  Lab=Lab collect   12/03/22 1211             Author: Jonah Blue, MD 12/09/2022 3:03 PM  For on call review [email or web address].

## 2022-12-09 NOTE — Progress Notes (Signed)
[facility]  ROUNDING NOTE   Subjective:   Patient sitting up in bed Wife at bedside assisting with feeding More alert today  UOP    Objective:  Vital signs in last 24 hours:  Temp:  [97.5 F (36.4 C)-98.3 F (36.8 C)] 97.8 F (36.6 C) (09/18 0838) Pulse Rate:  [39-56] 48 (09/18 0838) Resp:  [20-30] 22 (09/18 0838) BP: (124-149)/(49-88) 125/57 (09/18 0838) SpO2:  [89 %-97 %] 95 % (09/18 0838) Weight:  [113.7 kg-114.7 kg] 113.7 kg (09/17 1749)  Weight change:  Filed Weights   12/06/22 1400 12/08/22 1500 12/08/22 1749  Weight: 109.7 kg 114.7 kg 113.7 kg    Intake/Output: I/O last 3 completed shifts: In: 950.1 [P.O.:490; I.V.:430.1; NG/GT:30] Out: 2200 [Urine:1200; Other:1000]   Intake/Output this shift:  No intake/output data recorded.  Physical Exam: General: NAD  Head: Normocephalic, atraumatic. Moist oral mucosal membranes  Eyes: Anicteric  Neck: Supple  Lungs:  Clear to auscultation  Heart: bradycardia  Abdomen:  Soft, nontender  Extremities:  ++ peripheral edema.  Neurologic: Nonfocal, moving all four extremities  Skin: No lesions  Access: LIJ temp HD catheter    Basic Metabolic Panel: Recent Labs  Lab 12/04/22 0427 12/04/22 1558 12/05/22 0259 12/05/22 1832 12/06/22 0317 12/06/22 1217 12/06/22 2209 12/07/22 0328 12/08/22 0604 12/08/22 1613 12/09/22 0400  NA 135   < > 135   < > 134* 134* 134* 133* 136 135 135  K 4.0   < > 4.2   < > 4.3 4.0 4.3 4.3 4.3 3.9 3.9  CL 98   < > 99   < > 100 98 98 99 102 99 99  CO2 26   < > 26   < > 25 25 25 25 24 25 25   GLUCOSE 213*   < > 172*   < > 118* 156* 195* 107* 151* 167* 132*  BUN 64*   < > 41*   < > 51* 56* 63* 59* 79* 70* 68*  CREATININE 2.79*   < > 1.90*   < > 2.43* 2.73* 3.06* 3.21* 3.99* 3.39* 3.57*  CALCIUM 8.1*   < > 8.0*   < > 8.2* 8.5* 8.5* 8.5* 8.6* 8.3* 8.4*  MG 2.3  --  2.6*  --  2.7*  --  2.6* 2.5*  --   --   --   PHOS 3.1   < > 2.2*   < > 3.9 3.9  --  5.1* 6.6* 5.3* 5.5*    < > = values in this interval not displayed.    Liver Function Tests: Recent Labs  Lab 12/06/22 1217 12/07/22 0328 12/08/22 0604 12/08/22 1613 12/09/22 0400  ALBUMIN 3.1* 3.2* 3.2* 3.2* 3.1*   No results for input(s): "LIPASE", "AMYLASE" in the last 168 hours. No results for input(s): "AMMONIA" in the last 168 hours.  CBC: Recent Labs  Lab 12/03/22 0220 12/04/22 0427 12/06/22 0317 12/07/22 0328 12/08/22 0604 12/08/22 1613 12/09/22 0400  WBC 7.3   < > 7.0 5.4 6.0 5.1 6.1  NEUTROABS 5.8  --   --   --   --   --   --   HGB 9.5*   < > 9.5* 9.5* 9.8* 9.4* 9.5*  HCT 30.1*   < > 30.2* 30.2* 31.7* 30.0* 29.5*  MCV 94.7   < > 93.5 94.4 95.2 93.8 93.4  PLT 170   < > 175 165 162 153 154   < > = values in this interval not  displayed.    Cardiac Enzymes: No results for input(s): "CKTOTAL", "CKMB", "CKMBINDEX", "TROPONINI" in the last 168 hours.  BNP: Invalid input(s): "POCBNP"  CBG: Recent Labs  Lab 12/08/22 0729 12/08/22 1131 12/08/22 1523 12/08/22 2125 12/09/22 0839  GLUCAP 140* 183* 180* 236* 129*    Microbiology: Results for orders placed or performed during the hospital encounter of 11/26/22  MRSA Next Gen by PCR, Nasal     Status: None   Collection Time: 11/30/22  4:10 PM   Specimen: Nasal Mucosa; Nasal Swab  Result Value Ref Range Status   MRSA by PCR Next Gen NOT DETECTED NOT DETECTED Final    Comment: (NOTE) The GeneXpert MRSA Assay (FDA approved for NASAL specimens only), is one component of a comprehensive MRSA colonization surveillance program. It is not intended to diagnose MRSA infection nor to guide or monitor treatment for MRSA infections. Test performance is not FDA approved in patients less than 5 years old. Performed at [facility], [address] [place], [place] [address]     Coagulation Studies: Recent Labs    12/07/22 0328  LABPROT 15.6*  INR 1.2    Urinalysis: No results for input(s): "COLORURINE", "LABSPEC",  "PHURINE", "GLUCOSEU", "HGBUR", "BILIRUBINUR", "KETONESUR", "PROTEINUR", "UROBILINOGEN", "NITRITE", "LEUKOCYTESUR" in the last 72 hours.  Invalid input(s): "APPERANCEUR"    Imaging: No results found.   Medications:    sodium chloride     anticoagulant sodium citrate     heparin 1,200 Units/hr (12/09/22 0601)    vitamin C  500 mg Oral BID   Chlorhexidine Gluconate Cloth  6 each Topical Daily   cholecalciferol  2,000 Units Oral Daily   docusate sodium  100 mg Oral BID   doxazosin  1 mg Oral BID   feeding supplement (NEPRO CARB STEADY)  237 mL Oral TID BM   insulin aspart  0-15 Units Subcutaneous TID AC & HS   insulin aspart  3 Units Subcutaneous TID WC   multivitamin  1 tablet Oral QHS   neomycin-polymyxin b-dexamethasone  1 Application Right Eye TID   omega-3 acid ethyl esters  1 g Oral Daily   rosuvastatin  10 mg Oral Once per day on Monday Wednesday Friday   sodium chloride flush  3 mL Intravenous Q12H   sodium chloride, acetaminophen **OR** acetaminophen, alteplase, anticoagulant sodium citrate, calamine, feeding supplement (NEPRO CARB STEADY), fentaNYL (SUBLIMAZE) injection, heparin, ipratropium-albuterol, lidocaine (PF), lidocaine-prilocaine, ondansetron **OR** ondansetron (ZOFRAN) IV, oxyCODONE, pentafluoroprop-tetrafluoroeth, polyethylene glycol, sodium chloride flush, traZODone  Assessment/ Plan:  [name] is a 82 y.o.  male with diabetes mellitus type II, hypertension, coronary artery disease, chronic diastolic congestive heart failure, atrial fibrillation, pulmonary hypertension, and historyof subdural hematoma who is admitted on 11/26/2022 for Acute on chronic diastolic CHF (congestive heart failure) (HCC) [I50.33] Hypervolemia, unspecified hypervolemia type [E87.70]  Acute Kidney Injury on chronic kidney disease stage IV with proteinuria. Baseline creatinine of 3.24, GFR of 18. 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 CRRT from 9/12 to 9/13 - After  speaking with cardiology, family agreeable to additional dialysis treatments due to uremic symptoms - Dialysis received yesterday, UF 1L achieved.  - Receiving dialysis today, UF 1.5-2L as tolerated.  - May consider additional treatment tomorrow.   Acute exacerbation of chronic diastolic congestive heart failure: complicated with acute respiratory failure and pulmonary hypertension. Currently with junctional rhythm. - Room air - Will manage fluid with dialysis  Diabetes mellitus type II with chronic kidney disease - holding empagliflozin - Glucose well controlled.  LOS: 13 Zara Wendt 9/18/20249:41 AM

## 2022-12-09 NOTE — Consult Note (Signed)
ANTICOAGULATION CONSULT NOTE  Pharmacy Consult for IV Heparin Indication: atrial fibrillation  Patient Measurements: Height: 5\' 8"  (172.7 cm) Weight: 113.7 kg (250 lb 10.6 oz) IBW/kg (Calculated) : 68.4 Heparin Dosing Weight: 93 kg  Labs: Recent Labs    12/06/22 1018 12/06/22 1217 12/06/22 1747 12/06/22 2209 12/07/22 0328 12/08/22 0604 12/08/22 1613 12/09/22 0400  HGB  --   --   --    < > 9.5* 9.8* 9.4* 9.5*  HCT  --   --   --    < > 30.2* 31.7* 30.0* 29.5*  PLT  --   --   --    < > 165 162 153 154  APTT 87*  --  78*  --  95*  --   --   --   LABPROT  --   --   --   --  15.6*  --   --   --   INR  --   --   --   --  1.2  --   --   --   HEPARINUNFRC  --   --   --   --  0.70 0.51  --  0.41  CREATININE  --    < >  --    < > 3.21* 3.99* 3.39* 3.57*   < > = values in this interval not displayed.    Estimated Creatinine Clearance: 19.2 mL/min (A) (by C-G formula based on SCr of 3.57 mg/dL (H)).   Medical History: Past Medical History:  Diagnosis Date   AKI (acute kidney injury) (HCC) 07/06/2018   CAD (coronary artery disease)    a. Remote PCI/stenting to LAD w PTCA Diagnoal. RCA 60%; b.  2005/2008 Cardiolites w/ reportedly mild ischemia in Diag territory-->Med rx.   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 04/2019 Echo: EF 55-60%, no rwma, nl RV fxn, RVSP 55.89mmHg. Mod dil LA. Mild-mod MR. Mod dil PA.   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 04/2019 Echo: RVSP 55.65mmHg.   Permanent atrial fibrillation (HCC)    a. CHA2DS2VASc = 5-->dose adjusted eliquis (age/creat).   Subdural hematoma (HCC)    a. 01/2021 in setting of fall s/p L frontotemporal craniotomy.    Medications:  Apixaban  2.5 mg BID (last dose 9/12 at 1100)  Assessment: 82 y/o M with PMH as above admitted with acute on chronic diastolic CHF / RV failure complicated by CKD / cardiorenal syndrome. Patient is now being started on CRRT. Pharmacy consulted to transition apixaban to heparin for now in case further procedures required.  Baseline aPTT 52 (elevated) and INR 1.6 (elevated). Baseline CBC notable for stable anemia.  Goal of Therapy:  Heparin level 0.3-0.7 units/ml aPTT 66 - 102 seconds Monitor platelets by anticoagulation protocol: Yes   Date/time aPTT  HL  Comments 9/15@1018  87  --  Therapeutic x 1 9/15@1747  78  --  Therapeutic x 2 @ 1200 un/hr 9/16@0328      95                   0.7                   Therapeutic  X 3 9/17 0604 --  0.51  Therapeutic x 4 9/18 0400   0.41  Therapeutic x 5  Plan:  - Therapeutic X 5  -  Recheck HL daily w/ AM labs while therapeutic --Daily CBC per protocol while on IV heparin  Otelia Sergeant, PharmD, [facility] 12/09/2022 5:03 AM

## 2022-12-09 NOTE — Progress Notes (Signed)
Palliative Care Progress Note   Patient Name: Kyle Roberson       Date: 12/09/2022 DOB: 25-Dec-1940  Age: 82 y.o. MRN#: [id number] Attending Physician: Jonah Blue, MD Primary Care Physician: [name], MD Admit Date: 11/26/2022  Chart reviewed.   No acute palliative needs at this time.   Full code and full scope remain.   PMT will monitor the patient peripherally and shadow his chart. Please re-engage with PMT if goals change, at patient/family's request, or if patient's health deteriorates during hospitalization.    Thank you for allowing the Palliative Medicine Team to assist in the care of Kyle Roberson.  Samara Deist L. Bonita Quin, DNP, FNP-BC Palliative Medicine Team  No charge

## 2022-12-09 NOTE — Progress Notes (Signed)
Hemodialysis Note  Received patient in bed to unit. Alert and oriented. Informed consent signed and in chart.   Treatment initiated:0942 Treatment completed:1244  Patient tolerated treatment well. Transported back to the room alert, without acute distress. Report given to patient's RN.  Access used:LIJ Catheter  Access issues: None   Total UF removed: 1.5 Liters/1500 ml  Medications given: Heparin  Post HD VS: Stable  Post HD weight:92.2 kg   Bartolo Darter, RN  [facility]

## 2022-12-09 NOTE — Progress Notes (Signed)
Patient ID: Kyle Roberson, male   DOB: 1940-12-10, 82 y.o.   MRN: [id number]     Advanced Heart Failure Rounding Note  PCP-Cardiologist: Julien Nordmann, MD   Subjective:    Patient completed iHD with 1500 UF this morning, tolerated well but still upset with the potential long term dialysis implications.    Objective:   Weight Range: 113.7 kg Body mass index is 38.11 kg/m.   Vital Signs:   Temp:  [97.5 F (36.4 C)-98.6 F (37 C)] 98.6 F (37 C) (09/18 1241) Pulse Rate:  [39-56] 47 (09/18 1241) Resp:  [18-30] 24 (09/18 1241) BP: (118-149)/(47-88) 124/57 (09/18 1241) SpO2:  [89 %-100 %] 99 % (09/18 1241) Weight:  [113.7 kg-114.7 kg] 113.7 kg (09/17 1749) Last BM Date : 12/09/22  Weight change: Filed Weights   12/06/22 1400 12/08/22 1500 12/08/22 1749  Weight: 109.7 kg 114.7 kg 113.7 kg   Intake/Output:   Intake/Output Summary (Last 24 hours) at 12/09/2022 1257 Last data filed at 12/09/2022 1241 Gross per 24 hour  Intake 508.1 ml  Output 3250 ml  Net -2741.9 ml    Physical Exam   General: Laying in HD bed. Lethargic  HEENT: normal injected sclerae Neck: supple. JVP to jaw. With prominent v-waves  OZH:YQMVH 2/6 TR Lungs: + wheeze Abdomen: obese soft, nontender, nondistended. No hepatosplenomegaly. No bruits or masses. Good bowel sounds. Extremities: no cyanosis, clubbing, rash, severe venous stasis dermatitis likely 1+ edema Neuro: lethargic but arousable    Telemetry   Atrial fibrillation rate 40-50s, (personally reviewed)  Labs    Labs and imaging reviewed in Epic.  Medications:   Scheduled Medications:  vitamin C  500 mg Oral BID   Chlorhexidine Gluconate Cloth  6 each Topical Daily   cholecalciferol  2,000 Units Oral Daily   docusate sodium  100 mg Oral BID   doxazosin  1 mg Oral BID   feeding supplement (NEPRO CARB STEADY)  237 mL Oral TID BM   insulin aspart  0-15 Units Subcutaneous TID AC & HS   insulin aspart  3 Units Subcutaneous TID WC    multivitamin  1 tablet Oral QHS   neomycin-polymyxin b-dexamethasone  1 Application Right Eye TID   omega-3 acid ethyl esters  1 g Oral Daily   rosuvastatin  10 mg Oral Once per day on Monday Wednesday Friday   sodium chloride flush  3 mL Intravenous Q12H    Infusions:  sodium chloride     anticoagulant sodium citrate     heparin 1,200 Units/hr (12/09/22 0601)    PRN Medications: sodium chloride, acetaminophen **OR** acetaminophen, alteplase, anticoagulant sodium citrate, calamine, feeding supplement (NEPRO CARB STEADY), fentaNYL (SUBLIMAZE) injection, heparin, hydrocortisone cream, ipratropium-albuterol, lidocaine (PF), lidocaine-prilocaine, ondansetron **OR** ondansetron (ZOFRAN) IV, oxyCODONE, pentafluoroprop-tetrafluoroeth, polyethylene glycol, sodium chloride flush, traZODone  Assessment/Plan   1. Acute on chronic diastolic CHF: With prominent RV failure.  Echo this admission with EF 55-60%, mild LVH, moderate RV enlargement with mildly decreased RV systolic function, PASP 73 mmHg, severe LAE, mild-moderate TR, IVC dilated.  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 With significant volume overload, he was started on milrinone for RV support (no PICC with CKD IV) and diuresed with Lasix gtt.  Initial good response but UOP fell off significantly and creatinine up to 4.2, started CVVH on 9/12.  RHC 9/11 showed both right and left sided failure but RV failure predominated with RA pressure > 20. Milrinone was weaned and patient is currently showing to be HD  dependent. Seen by surgery for consideration of PD catheter, but not a candidate at this time - now off milrinone and CVVHD - Tolerated short HD run again today - PD may be an option down the road, but not a candidate currently  2. AKI on CKD stage IV: Complicates CHF.   As above, difficult situation with severe predominantly RV failure/volume overload and cardiorenal syndrome.  Started on CVVH on 9/12 with UOP falling  off despite high doses of diuretics, transitioned to iHD. - Resumed on iHD, his frailty and cardiopulmonary comorbid conditions make long term dialysis difficult. Not a PD candidate at this time. May need to revisit when more euvolemic  3.  Atrial fibrillation: Permanent. Bradycardic currently - Apixaban 2.5 bid on hold. Continue heparin - having bradycardia. Could consider PPM for support but unlikely to change long term prospects.  4. CAD: Remote PCI to LAD and diagonal.  .  - No s/s angina Continue statin - No ASA with apixaban use.   5. 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   - Management as above   Length of Stay: 13  Romie Minus, MD  12/09/2022, 12:57 PM  Advanced Heart Failure Team Pager [phone] (M-F; 7a - 5p)  Please contact [facility] for night-coverage after hours (5p -7a ) and weekends on [email or web address]

## 2022-12-09 NOTE — Plan of Care (Signed)

## 2022-12-09 NOTE — Progress Notes (Signed)
OT Cancellation Note  Patient Details Name: Kyle Roberson MRN: [id number] DOB: 07-05-1940   Cancelled Treatment:    Reason Eval/Treat Not Completed: Fatigue/lethargy limiting ability to participate. Chart reviewed. Pt and spouse at bed side report fatigue following HD. Requesting to return next date. Will re-attempt as able.   Kathie Dike, M.S. OTR/L  12/09/22, 2:54 PM  ascom [phone]

## 2022-12-09 NOTE — Consult Note (Signed)
Patient ID: Kyle Roberson, male   DOB: March 10, 1941, 82 y.o.   MRN: [id number]  HPI Kyle Roberson is a 82 y.o. 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 Echo with EF 55-60% Pulmonary HTN.  DM, LE Lymphedema, a fib anticoagulated. WBC 6 hb 9.5, creat 3.57 BUN 68 No prior abdominal operations AcCording to his wife he was in his usual state few weeks ago and he is mind is pristine. He definitely have very limited physiologic reserve  HPI  Past Medical History:  Diagnosis Date   AKI (acute kidney injury) (HCC) 07/06/2018   CAD (coronary artery disease)    a. Remote PCI/stenting to LAD w PTCA Diagnoal. RCA 60%; b.  2005/2008 Cardiolites w/ reportedly mild ischemia in Diag territory-->Med rx.   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 04/2019 Echo: EF 55-60%, no rwma, nl RV fxn, RVSP 55.64mmHg. Mod dil LA. Mild-mod MR. Mod dil PA.   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 04/2019 Echo: RVSP 55.51mmHg.   Permanent atrial fibrillation (HCC)    a. CHA2DS2VASc = 5-->dose adjusted eliquis (age/creat).   Subdural hematoma (HCC)    a. 01/2021 in setting of fall s/p L frontotemporal craniotomy.    Past Surgical History:  Procedure Laterality Date   BLEPHAROPLASTY     CARDIAC CATHETERIZATION     CORONARY STENT INTERVENTION     CRANIOTOMY Left 02/04/2021   Procedure: CRANIOTOMY FOR LEFT SUBDURAL  HEMATOMA EVACUATION;  Surgeon: Lucy Chris, MD;  Location: [facility] ORS;  Service: Neurosurgery;  Laterality: Left;   CYSTOSCOPY     HERNIA REPAIR     RIGHT  HEART CATH N/A 12/02/2022   Procedure: RIGHT HEART CATH;  Surgeon: Laurey Morale, MD;  Location: [facility] INVASIVE CV LAB;  Service: Cardiovascular;  Laterality: N/A;    Family History  Problem Relation Age of Onset   Pancreatic cancer Mother    CAD Father    Diabetes Brother     Social History Social History   Tobacco Use   Smoking status: Former   Smokeless tobacco: Never  Advertising account planner   Vaping status: Never Used  Substance Use Topics   Alcohol use: Not Currently    Alcohol/week: 2.0 standard drinks of alcohol    Types: 2 Glasses of wine per week   Drug use: Not Currently    Allergies  Allergen Reactions   Atorvastatin Hives, Itching and Other (See Comments)    Other reaction(s): Other (See Comments)   Simvastatin Hives, Itching and Other (See Comments)    Other reaction(s): Other (See Comments)    Current Facility-Administered Medications  Medication Dose Route Frequency Provider Last Rate Last Admin   0.9 %  sodium chloride infusion  250 mL Intravenous PRN Laurey Morale, MD       acetaminophen (TYLENOL) tablet 650 mg  650 mg Oral Q6H PRN Raechel Chute, MD   650 mg at 12/09/22 0251   Or   acetaminophen (TYLENOL) suppository 650 mg  650 mg Rectal Q6H PRN Raechel Chute, MD  alteplase (CATHFLO ACTIVASE) injection 2 mg  2 mg Intracatheter Once PRN Kolluru, Threasa Heads, MD       anticoagulant sodium citrate solution 5 mL  5 mL Intracatheter PRN Kolluru, Sarath, MD       ascorbic acid (VITAMIN C) tablet 500 mg  500 mg Oral BID Raechel Chute, MD   500 mg at 12/08/22 2219   calamine lotion 1 Application  1 Application Topical PRN Laurey Morale, MD       Chlorhexidine Gluconate Cloth 2 % PADS 6 each  6 each Topical Daily Enedina Finner, MD   6 each at 12/08/22 1000   cholecalciferol (VITAMIN D3) 25 MCG (1000 UNIT) tablet 2,000 Units  2,000 Units Oral Daily Laurey Morale, MD   2,000 Units at 12/08/22 1134   docusate sodium (COLACE) capsule 100 mg  100 mg Oral BID Laurey Morale, MD   100 mg at 12/08/22 2219   doxazosin (CARDURA) tablet 1 mg  1 mg Oral BID Laurey Morale, MD   1 mg at 12/08/22 2219   feeding supplement (NEPRO CARB STEADY) liquid 237 mL  237 mL Oral TID BM Enedina Finner, MD   237 mL at 12/08/22 2221   feeding supplement (NEPRO CARB STEADY) liquid 237 mL  237 mL Oral PRN Kolluru, Sarath, MD       fentaNYL (SUBLIMAZE) injection 12.5-25 mcg  12.5-25 mcg Intravenous Q2H PRN Harlon Ditty D, NP   25 mcg at 12/05/22 2330   heparin ADULT infusion 100 units/mL (25000 units/250mL)  1,200 Units/hr Intravenous Continuous Raechel Chute, MD 12 mL/hr at 12/09/22 0601 1,200 Units/hr at 12/09/22 0601   heparin injection 1,000 Units  1,000 Units Intracatheter PRN Kolluru, Threasa Heads, MD   2,800 Units at 12/08/22 1721   hydrocortisone cream 0.5 %   Topical PRN Georgiann Cocker, FNP       insulin aspart (novoLOG) injection 0-15 Units  0-15 Units Subcutaneous TID AC & HS Rust-Chester, Britton L, NP   5 Units at 12/08/22 2220   insulin aspart (novoLOG) injection 3 Units  3 Units Subcutaneous TID WC Erin Fulling, MD   3 Units at 12/08/22 1141   ipratropium-albuterol (DUONEB) 0.5-2.5 (3) MG/3ML nebulizer solution 3 mL  3 mL Nebulization Q6H PRN Laurey Morale, MD   3 mL at 11/28/22 1219   lidocaine (PF) (XYLOCAINE) 1 % injection 5 mL  5 mL Intradermal PRN Kolluru, Sarath, MD       lidocaine-prilocaine (EMLA) cream 1 Application  1 Application Topical PRN Kolluru, Sarath, MD       multivitamin (RENA-VIT) tablet 1 tablet  1 tablet Oral QHS Raechel Chute, MD   1 tablet at 12/08/22 2219   neomycin-polymyxin b-dexamethasone (MAXITROL) ophthalmic ointment 1 Application  1 Application Right Eye TID Laurey Morale, MD   1 Application at 12/08/22 2221   omega-3 acid ethyl esters (LOVAZA) capsule 1 g  1 g Oral Daily Dgayli, Lianne Bushy, MD   1 g at 12/08/22 1135   ondansetron (ZOFRAN) tablet 4 mg  4 mg Oral Q6H PRN Laurey Morale, MD       Or   ondansetron [facility]) injection 4 mg   4 mg Intravenous Q6H PRN Laurey Morale, MD   4 mg at 12/01/22 1451   oxyCODONE (Oxy IR/ROXICODONE) immediate release tablet 5 mg  5 mg Oral Q6H PRN Raechel Chute, MD       pentafluoroprop-tetrafluoroeth (GEBAUERS) aerosol 1 Application  1 Application Topical PRN Lamont Dowdy, MD  polyethylene glycol (MIRALAX / GLYCOLAX) packet 17 g  17 g Oral Daily PRN Raechel Chute, MD       rosuvastatin (CRESTOR) tablet 10 mg  10 mg Oral Once per day on Monday Wednesday Friday Raechel Chute, MD   10 mg at 12/07/22 0939   sodium chloride flush (NS) 0.9 % injection 3 mL  3 mL Intravenous Q12H Laurey Morale, MD   3 mL at 12/08/22 2221   sodium chloride flush (NS) 0.9 % injection 3 mL  3 mL Intravenous PRN Laurey Morale, MD       traZODone (DESYREL) tablet 25 mg  25 mg Oral QHS PRN Raechel Chute, MD         Review of Systems Full ROS  was asked and was negative except for the information on the HPI  Physical Exam Blood pressure (!) 133/53, pulse (!) 49, temperature 98.3 F (36.8 C), temperature source Axillary, resp. rate 20, height 5\' 8"  (1.727 m), weight 113.7 kg, SpO2 100%. CONSTITUTIONAL: Debilitated elderly male, tachypneic. EYES: Pupils are equal, round, injected sclerae Sclera are non-icteric. EARS, NOSE, MOUTH AND THROAT: He is wearing a mask Hearing is intact to voice. LYMPH NODES:  Lymph nodes in the neck are normal. RESPIRATORY:  Lungs w wheezes, tachypnea.  Wheezes CARDIOVASCULAR: Heart is regular without murmurs, gallops, or rubs. Left internal jugular Perm Cath in place GI: The abdomen is  soft, no peritonitis.  There seems to be some ascites   GU: Rectal deferred.   MUSCULOSKELETAL: There is pedal edema NEUROLOGIC: no focal motor deficits  PSYCH:  Oriented to person, place and time. He is frustrated and wishes to go back to the floor and be HD  Data Reviewed  I have personally reviewed the patient's imaging, laboratory findings and medical records.     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 Echo with EF 55-60% Pulmonary HTN.   He has significant pulmonary issues at this time with dyspnea at rest and tachypnea. At this time he is in no shape or form ready for an elective procedure such as a laparoscopic PD catheter placement and GETA.  I do think the best thing to do is for he and his wife to have a honest conversation about long-term goals.  Also will like to have renal team provide assistance regarding options for his end-stage renal disease. Wife wanted me to talk back to them tomorrow to close the loop and answer more questions. Alternative if his pulmonary status and cardiac status improve we might consider elective outpatient.  Catheter placement in several weeks. I will see them back again tomorrow.  Please note that I spent 75 minutes in this encounter including person reviewing imaging studies, coordinating his care, placing orders and performing documentation.  Sterling Big, MD FACS General Surgeon 12/09/2022, 11:42 AM

## 2022-12-10 DIAGNOSIS — I5033 Acute on chronic diastolic (congestive) heart failure: Secondary | ICD-10-CM | POA: Diagnosis not present

## 2022-12-10 LAB — GLUCOSE, CAPILLARY
Glucose-Capillary: 115 mg/dL — ABNORMAL HIGH (ref 70–99)
Glucose-Capillary: 177 mg/dL — ABNORMAL HIGH (ref 70–99)
Glucose-Capillary: 222 mg/dL — ABNORMAL HIGH (ref 70–99)
Glucose-Capillary: 205 mg/dL — ABNORMAL HIGH (ref 70–99)

## 2022-12-10 LAB — RENAL FUNCTION PANEL
Albumin: 3.1 g/dL — ABNORMAL LOW (ref 3.5–5.0)
Anion gap: 10 (ref 5–15)
BUN: 57 mg/dL — ABNORMAL HIGH (ref 8–23)
CO2: 26 mmol/L (ref 22–32)
Calcium: 8.3 mg/dL — ABNORMAL LOW (ref 8.9–10.3)
Chloride: 99 mmol/L (ref 98–111)
Creatinine, Ser: 3.19 mg/dL — ABNORMAL HIGH (ref 0.61–1.24)
GFR, Estimated: 19 mL/min — ABNORMAL LOW (ref >60)
Glucose, Bld: 136 mg/dL — ABNORMAL HIGH (ref 70–99)
Phosphorus: 5.2 mg/dL — ABNORMAL HIGH (ref 2.5–4.6)
Potassium: 4.3 mmol/L (ref 3.5–5.1)
Sodium: 135 mmol/L (ref 135–145)

## 2022-12-10 LAB — CBC
HCT: 31.8 % — ABNORMAL LOW (ref 39.0–52.0)
Hemoglobin: 9.9 g/dL — ABNORMAL LOW (ref 13.0–17.0)
MCH: 29.3 pg (ref 26.0–34.0)
MCHC: 31.1 g/dL (ref 30.0–36.0)
MCV: 94.1 fL (ref 80.0–100.0)
Platelets: 158 10*3/uL (ref 150–400)
RBC: 3.38 MIL/uL — ABNORMAL LOW (ref 4.22–5.81)
RDW: 14.5 % (ref 11.5–15.5)
WBC: 6.2 10*3/uL (ref 4.0–10.5)
nRBC: 0 % (ref 0.0–0.2)

## 2022-12-10 LAB — HEPARIN LEVEL (UNFRACTIONATED): Heparin Unfractionated: 0.38 IU/mL (ref 0.30–0.70)

## 2022-12-10 MED ORDER — FUROSEMIDE 10 MG/ML IJ SOLN
80.0000 mg | Freq: Once | INTRAMUSCULAR | Status: AC
  Administered 2022-12-10: 80 mg via INTRAVENOUS
  Filled 2022-12-10: qty 8

## 2022-12-10 NOTE — Progress Notes (Signed)
[facility]  ROUNDING NOTE   Subjective:   Patient seen sitting up in bed Wife at bedside Patient more alert  Discussed treatment options with patient and family, would like to hold dialysis today and offer diuretic. Unsure of future that includes hemodialysis  UOP    Objective:  Vital signs in last 24 hours:  Temp:  [97.2 F (36.2 C)-98.7 F (37.1 C)] 97.5 F (36.4 C) (09/19 1104) Pulse Rate:  [43-54] 54 (09/19 1104) Resp:  [16-32] 16 (09/19 1104) BP: (119-132)/(47-57) 121/52 (09/19 1104) SpO2:  [93 %-100 %] 93 % (09/19 1104)  Weight change:  Filed Weights   12/06/22 1400 12/08/22 1500 12/08/22 1749  Weight: 109.7 kg 114.7 kg 113.7 kg    Intake/Output: I/O last 3 completed shifts: In: 852.1 [P.O.:480; I.V.:372.1] Out: 2100 [Urine:600; Other:1500]   Intake/Output this shift:  Total I/O In: -  Out: 250 [Urine:250]  Physical Exam: General: NAD  Head: Normocephalic, atraumatic. Moist oral mucosal membranes  Eyes: Anicteric  Neck: Supple  Lungs:  Clear to auscultation  Heart: bradycardia  Abdomen:  Soft, nontender  Extremities:  ++ peripheral edema.  Neurologic: Nonfocal, moving all four extremities  Skin: No lesions  Access: LIJ temp HD catheter    Basic Metabolic Panel: Recent Labs  Lab 12/04/22 0427 12/04/22 1558 12/05/22 0259 12/05/22 1832 12/06/22 0317 12/06/22 1217 12/06/22 2209 12/07/22 0328 12/08/22 0604 12/08/22 1613 12/09/22 0400 12/10/22 0444  NA 135   < > 135   < > 134*   < > 134* 133* 136 135 135 135  K 4.0   < > 4.2   < > 4.3   < > 4.3 4.3 4.3 3.9 3.9 4.3  CL 98   < > 99   < > 100   < > 98 99 102 99 99 99  CO2 26   < > 26   < > 25   < > 25 25 24 25 25 26   GLUCOSE 213*   < > 172*   < > 118*   < > 195* 107* 151* 167* 132* 136*  BUN 64*   < > 41*   < > 51*   < > 63* 59* 79* 70* 68* 57*  CREATININE 2.79*   < > 1.90*   < > 2.43*   < > 3.06* 3.21* 3.99* 3.39* 3.57* 3.19*  CALCIUM 8.1*   < > 8.0*   < > 8.2*   < > 8.5*  8.5* 8.6* 8.3* 8.4* 8.3*  MG 2.3  --  2.6*  --  2.7*  --  2.6* 2.5*  --   --   --   --   PHOS 3.1   < > 2.2*   < > 3.9   < >  --  5.1* 6.6* 5.3* 5.5* 5.2*   < > = values in this interval not displayed.    Liver Function Tests: Recent Labs  Lab 12/07/22 0328 12/08/22 0604 12/08/22 1613 12/09/22 0400 12/10/22 0444  ALBUMIN 3.2* 3.2* 3.2* 3.1* 3.1*   No results for input(s): "LIPASE", "AMYLASE" in the last 168 hours. No results for input(s): "AMMONIA" in the last 168 hours.  CBC: Recent Labs  Lab 12/07/22 0328 12/08/22 0604 12/08/22 1613 12/09/22 0400 12/10/22 0444  WBC 5.4 6.0 5.1 6.1 6.2  HGB 9.5* 9.8* 9.4* 9.5* 9.9*  HCT 30.2* 31.7* 30.0* 29.5* 31.8*  MCV 94.4 95.2 93.8 93.4 94.1  PLT 165 162 153 154 158    Cardiac  Enzymes: No results for input(s): "CKTOTAL", "CKMB", "CKMBINDEX", "TROPONINI" in the last 168 hours.  BNP: Invalid input(s): "POCBNP"  CBG: Recent Labs  Lab 12/09/22 0839 12/09/22 1420 12/09/22 2028 12/10/22 0839 12/10/22 1100  GLUCAP 129* 125* 244* 115* 177*    Microbiology: Results for orders placed or performed during the hospital encounter of 11/26/22  MRSA Next Gen by PCR, Nasal     Status: None   Collection Time: 11/30/22  4:10 PM   Specimen: Nasal Mucosa; Nasal Swab  Result Value Ref Range Status   MRSA by PCR Next Gen NOT DETECTED NOT DETECTED Final    Comment: (NOTE) The GeneXpert MRSA Assay (FDA approved for NASAL specimens only), is one component of a comprehensive MRSA colonization surveillance program. It is not intended to diagnose MRSA infection nor to guide or monitor treatment for MRSA infections. Test performance is not FDA approved in patients less than 27 years old. Performed at [facility], [address] [place], [place] [address]     Coagulation Studies: No results for input(s): "LABPROT", "INR" in the last 72 hours.   Urinalysis: No results for input(s): "COLORURINE", "LABSPEC", "PHURINE",  "GLUCOSEU", "HGBUR", "BILIRUBINUR", "KETONESUR", "PROTEINUR", "UROBILINOGEN", "NITRITE", "LEUKOCYTESUR" in the last 72 hours.  Invalid input(s): "APPERANCEUR"    Imaging: No results found.   Medications:    sodium chloride     heparin 1,200 Units/hr (12/09/22 2321)    vitamin C  500 mg Oral BID   Chlorhexidine Gluconate Cloth  6 each Topical Daily   cholecalciferol  2,000 Units Oral Daily   docusate sodium  100 mg Oral BID   doxazosin  1 mg Oral BID   feeding supplement (NEPRO CARB STEADY)  237 mL Oral TID BM   insulin aspart  0-15 Units Subcutaneous TID AC & HS   insulin aspart  3 Units Subcutaneous TID WC   multivitamin  1 tablet Oral QHS   neomycin-polymyxin b-dexamethasone  1 Application Right Eye TID   omega-3 acid ethyl esters  1 g Oral Daily   rosuvastatin  10 mg Oral Once per day on Monday Wednesday Friday   sodium chloride flush  3 mL Intravenous Q12H   sodium chloride, acetaminophen **OR** acetaminophen, calamine, fentaNYL (SUBLIMAZE) injection, hydrocortisone cream, ipratropium-albuterol, ondansetron **OR** ondansetron (ZOFRAN) IV, oxyCODONE, polyethylene glycol, sodium chloride flush, traZODone  Assessment/ Plan:  Kyle Roberson is a 82 y.o.  male with diabetes mellitus type II, hypertension, coronary artery disease, chronic diastolic congestive heart failure, atrial fibrillation, pulmonary hypertension, and historyof subdural hematoma who is admitted on 11/26/2022 for Acute on chronic diastolic CHF (congestive heart failure) (HCC) [I50.33] Hypervolemia, unspecified hypervolemia type [E87.70]  Acute Kidney Injury on chronic kidney disease stage IV with proteinuria. Baseline creatinine of 3.24, GFR of 18. 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 CRRT from 9/12 to 9/13 - Patient received 2 dialysis treatments.  Treatment yesterday with UF of 1.5 L. - Family requested to hold dialysis today and would prefer diuresis today. - Will order IV furosemide 80mg  once.   - Will schedule dialysis tomorrow.  - After treatment, will remove HD temp cath and allow for line free holiday. Will assess need for permcath next week.   Acute exacerbation of chronic diastolic congestive heart failure: complicated with acute respiratory failure and pulmonary hypertension. Currently with junctional rhythm. - Room air - Will manage fluid with dialysis and duresis  Diabetes mellitus type II with chronic kidney disease - holding empagliflozin - Glucose well controlled.    LOS:  14 Alvenia Treese 9/19/202411:41 AM

## 2022-12-10 NOTE — Progress Notes (Signed)
Spouse has deferred dialysis treatment until she speaks with physician.

## 2022-12-10 NOTE — Consult Note (Signed)
ANTICOAGULATION CONSULT NOTE  Pharmacy Consult for IV Heparin Indication: atrial fibrillation  Patient Measurements: Height: 5\' 8"  (172.7 cm) Weight:  (unable to weigh, patients bed needs to be zerod out) IBW/kg (Calculated) : 68.4 Heparin Dosing Weight: 93 kg  Labs: Recent Labs    12/08/22 0604 12/08/22 1613 12/09/22 0400 12/10/22 0444  HGB 9.8* 9.4* 9.5* 9.9*  HCT 31.7* 30.0* 29.5* 31.8*  PLT 162 153 154 158  HEPARINUNFRC 0.51  --  0.41 0.38  CREATININE 3.99* 3.39* 3.57* 3.19*    Estimated Creatinine Clearance: 21.5 mL/min (A) (by C-G formula based on SCr of 3.19 mg/dL (H)).   Medical History: Past Medical History:  Diagnosis Date   AKI (acute kidney injury) (HCC) 07/06/2018   CAD (coronary artery disease)    a. Remote PCI/stenting to LAD w PTCA Diagnoal. RCA 60%; b.  2005/2008 Cardiolites w/ reportedly mild ischemia in Diag territory-->Med rx.   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 04/2019 Echo: EF 55-60%, no rwma, nl RV fxn, RVSP 55.70mmHg. Mod dil LA. Mild-mod MR. Mod dil PA.   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 04/2019 Echo: RVSP 55.5mmHg.   Permanent atrial fibrillation (HCC)    a. CHA2DS2VASc = 5-->dose adjusted eliquis (age/creat).   Subdural hematoma (HCC)    a. 01/2021 in setting of fall s/p L frontotemporal craniotomy.    Medications:  Apixaban 2.5 mg BID (last dose 9/12 at 1100)  Assessment: 82 y/o M with PMH as above admitted with acute on chronic diastolic CHF / RV failure complicated by CKD / cardiorenal syndrome. Patient is now being started on CRRT. Pharmacy consulted to transition apixaban to heparin for now in case further procedures  required.  Baseline aPTT 52 (elevated) and INR 1.6 (elevated). Baseline CBC notable for stable anemia.  Goal of Therapy:  Heparin level 0.3-0.7 units/ml aPTT 66 - 102 seconds Monitor platelets by anticoagulation protocol: Yes   Date/time aPTT  HL  Comments 9/15@1018  87  --  Therapeutic x 1 9/15@1747  78  --  Therapeutic x 2 @ 1200 un/hr 9/16@0328      95                   0.7                   Therapeutic X 3 9/17 0604 --  0.51  Therapeutic x 4 9/18 0400   0.41  Therapeutic x 5 9/19 0444 --  0.38  Therapeutic x 6   Plan:  --Therapeutic X 6 -- Recheck HL daily w/ AM labs while therapeutic -- Daily CBC per protocol while on IV heparin  Otelia Sergeant, PharmD, [facility] 12/10/2022 5:59 AM

## 2022-12-10 NOTE — Progress Notes (Signed)
Patient ID: Kyle Roberson, male   DOB: 1941-01-09, 82 y.o.   MRN: [id number]     Advanced Heart Failure Rounding Note  PCP-Cardiologist: Julien Nordmann, MD   Subjective:    Had HD yesterday. Feeling bette. More alert. No SOB.   Working with PT. Wife asking for a day off of HD today so he can work with PT  Making some urine on lasix 80 daily   Objective:   Weight Range: 113.7 kg Body mass index is 38.11 kg/m.   Vital Signs:   Temp:  [97.2 F (36.2 C)-98.7 F (37.1 C)] 97.5 F (36.4 C) (09/19 1104) Pulse Rate:  [43-54] 54 (09/19 1104) Resp:  [16-32] 16 (09/19 1104) BP: (119-132)/(47-57) 121/52 (09/19 1104) SpO2:  [93 %-100 %] 93 % (09/19 1104) Last BM Date : 12/09/22  Weight change: Filed Weights   12/06/22 1400 12/08/22 1500 12/08/22 1749  Weight: 109.7 kg 114.7 kg 113.7 kg   Intake/Output:   Intake/Output Summary (Last 24 hours) at 12/10/2022 1144 Last data filed at 12/10/2022 0842 Gross per 24 hour  Intake 343.98 ml  Output 2350 ml  Net -2006.02 ml    Physical Exam   General: Sitting up on side of bed  No resp difficulty HEENT: normal injected sclerae Neck: supple.JVP 8 + prominent v waves  LIJ HD cath Carotids 2+ bilat; no bruits. No lymphadenopathy or thryomegaly appreciated. Cor: PMI nondisplaced. Iregular rate & rhythm. 2/6 TR Lungs: clear Abdomen: obese soft, nontender, nondistended. No hepatosplenomegaly. No bruits or masses. Good bowel sounds. Extremities: no cyanosis, clubbing, rash, + severe stasis dermatitis. Minimal edema Neuro: alert & orientedx3, cranial nerves grossly intact. moves all 4 extremities w/o difficulty. Affect pleasant   Telemetry   Atrial fibrillation rate 40-50s, some 30s overnight (personally reviewed)  Labs    Labs and imaging reviewed in Epic.  Medications:   Scheduled Medications:  vitamin C  500 mg Oral BID   Chlorhexidine Gluconate Cloth  6 each Topical Daily   cholecalciferol  2,000 Units Oral Daily    docusate sodium  100 mg Oral BID   doxazosin  1 mg Oral BID   feeding supplement (NEPRO CARB STEADY)  237 mL Oral TID BM   insulin aspart  0-15 Units Subcutaneous TID AC & HS   insulin aspart  3 Units Subcutaneous TID WC   multivitamin  1 tablet Oral QHS   neomycin-polymyxin b-dexamethasone  1 Application Right Eye TID   omega-3 acid ethyl esters  1 g Oral Daily   rosuvastatin  10 mg Oral Once per day on Monday Wednesday Friday   sodium chloride flush  3 mL Intravenous Q12H    Infusions:  sodium chloride     heparin 1,200 Units/hr (12/09/22 2321)    PRN Medications: sodium chloride, acetaminophen **OR** acetaminophen, calamine, fentaNYL (SUBLIMAZE) injection, hydrocortisone cream, ipratropium-albuterol, ondansetron **OR** ondansetron (ZOFRAN) IV, oxyCODONE, polyethylene glycol, sodium chloride flush, traZODone  Assessment/Plan   1. Acute on chronic diastolic CHF: With prominent RV failure.  Echo this admission with EF 55-60%, mild LVH, moderate RV enlargement with mildly decreased RV systolic function, PASP 73 mmHg, severe LAE, mild-moderate TR, IVC dilated.  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 With significant volume overload, he was started on milrinone for RV support (no PICC with CKD IV) and diuresed with Lasix gtt.  Initial good response but UOP fell off significantly and creatinine up to 4.2, started CVVH on 9/12.  RHC 9/11 showed both right and left sided failure  but RV failure predominated with RA pressure > 20. Milrinone was weaned and patient is currently showing to be HD dependent. Seen by surgery for consideration of PD catheter, but not a candidate at this time - Volume status well maintained with HD. Management of HD and diuretics per Renal - Agree with placement of tunneled cath - PD may be an option down the road, but not a candidate currently  2. AKI on CKD stage IV: Complicates CHF.   As above, difficult situation with severe predominantly RV  failure/volume overload and cardiorenal syndrome.  Started on CVVH on 9/12 with UOP falling off despite high doses of diuretics, transitioned to iHD. - Resumed on iHD, his frailty and cardiopulmonary comorbid conditions make long term dialysis difficult. Not a PD candidate at this time. May need to revisit when more euvolemic - Management as above  3.  Atrial fibrillation: Permanent. Bradycardic currently - Apixaban 2.5 bid on hold. Continue heparin - having bradycardia. Could consider PPM for support but unlikely to change long term prospects and would be a nidus for infection on HD - Ventricular rates 40-50s but go into 60-70 range on standing. Nighttime brady into 30s is asymptomatic  4. CAD: Remote PCI to LAD and diagonal.  .  - No s/s angina Continue statin - No ASA with apixaban use.   5. 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   - Management as above   Length of Stay: 14  Arvilla Meres, MD  12/10/2022, 11:44 AM  Advanced Heart Failure Team Pager [phone] (M-F; 7a - 5p)  Please contact [facility] for night-coverage after hours (5p -7a ) and weekends on [email or web address]

## 2022-12-10 NOTE — Progress Notes (Signed)
Occupational Therapy Treatment Patient Details Name: Kyle Roberson MRN: [id number] DOB: [birth year] Today's Date: 12/10/2022   History of present illness Kyle Roberson is an 81yoM who comes to [facility] from OP nephrology on 9/5 due to LEE and ABD swelling. PMH: CKD, CHF. As of 9/9 he was transferred to ICU for IV milrinone gtt per [facility] heart failure MD.   OT comments  Kyle Roberson was seen for OT/PT co-treatment on this date. Upon arrival to room pt reclined in bed, agreeable to tx. Pt requires MIN A don gown in sitting. MOD A x2 + RW for x3 sit<>stands and side steps along EOB. Improved  sitting balance/tolerance. Pt making progress toward goals, will continue to follow POC. Discharge recommendation remains appropriate.       If plan is discharge home, recommend the following:  A lot of help with bathing/dressing/bathroom;Assistance with cooking/housework;Assist for transportation;Help with stairs or ramp for entrance;Two people to help with walking and/or transfers   Equipment Recommendations  BSC/3in1    Recommendations for Other Services      Precautions / Restrictions Precautions Precautions: Fall Restrictions Weight Bearing Restrictions: No       Mobility Bed Mobility Overal bed mobility: Needs Assistance Bed Mobility: Supine to Sit, Sit to Supine Rolling: +2 for physical assistance, Mod assist   Supine to sit: Mod assist, +2 for physical assistance Sit to supine: Max assist, +2 for physical assistance        Transfers Overall transfer level: Needs assistance Equipment used: Rolling walker (2 wheels) Transfers: Sit to/from Stand Sit to Stand: Mod assist, +2 physical assistance                 Balance Overall balance assessment: Needs assistance Sitting-balance support: Feet supported Sitting balance-Leahy Scale: Fair     Standing balance support: Bilateral upper extremity supported Standing balance-Leahy Scale: Poor                              ADL either performed or assessed with clinical judgement   ADL Overall ADL's : Needs assistance/impaired                                       General ADL Comments: MIN A don gown in sitting. MOD A x2 + RW for ADL t/f      Cognition Arousal: Alert Behavior During Therapy: WFL for tasks assessed/performed Overall Cognitive Status: Within Functional Limits for tasks assessed                                                     Pertinent Vitals/ Pain       Pain Assessment Pain Assessment: Faces Faces Pain Scale: Hurts little more Pain Location: LUE Pain Descriptors / Indicators: Discomfort, Grimacing Pain Intervention(s): Limited activity within patient's tolerance   Frequency  Min 1X/week        Progress Toward Goals  OT Goals(current goals can now be found in the care plan section)  Progress towards OT goals: Progressing toward goals  Acute Rehab OT Goals Patient Stated Goal: get stronger OT Goal Formulation: With patient/family Time For Goal Achievement: 12/17/22 Potential to Achieve Goals: Good ADL Goals Pt Will Perform Lower Body Dressing:  with caregiver independent in assisting;sit to/from stand Pt Will Transfer to Toilet: ambulating;bedside commode;with supervision Pt Will Perform Toileting - Clothing Manipulation and hygiene: with caregiver independent in assisting Additional ADL Goal #1: Pt will verbalize plan to implement at least 2 learned ECS/falls prevention strategies to maximize safety/indep with ADL and mobility.  Plan      Co-evaluation    PT/OT/SLP Co-Evaluation/Treatment: Yes Reason for Co-Treatment: Complexity of the patient's impairments (multi-system involvement);To address functional/ADL transfers PT goals addressed during session: Mobility/safety with mobility OT goals addressed during session: ADL's and self-care      AM-PAC OT "6 Clicks" Daily Activity     Outcome Measure   Help from another  person eating meals?: None Help from another person taking care of personal grooming?: A Little Help from another person toileting, which includes using toliet, bedpan, or urinal?: A Lot Help from another person bathing (including washing, rinsing, drying)?: A Lot Help from another person to put on and taking off regular upper body clothing?: A Little Help from another person to put on and taking off regular lower body clothing?: A Lot 6 Click Score: 16    End of Session    OT Visit Diagnosis: Other abnormalities of gait and mobility (R26.89);Muscle weakness (generalized) (M62.81)   Activity Tolerance Patient tolerated treatment well   Patient Left in bed;with call bell/phone within reach;with family/visitor present   Nurse Communication          Time: 4401-0272 OT Time Calculation (min): 32 min  Charges: OT General Charges $OT Visit: 1 Visit OT Treatments $Self Care/Home Management : 8-22 mins  Kathie Dike, M.S. OTR/L  12/10/22, 12:48 PM  ascom [phone]

## 2022-12-10 NOTE — Plan of Care (Signed)
  Problem: Education: Goal: Ability to describe self-care measures that may prevent or decrease complications (Diabetes Survival Skills Education) will improve Outcome: Not Progressing   Problem: Coping: Goal: Ability to adjust to condition or change in health will improve Outcome: Progressing   Problem: Fluid Volume: Goal: Ability to maintain a balanced intake and output will improve Outcome: Not Progressing

## 2022-12-10 NOTE — Progress Notes (Signed)
Progress Note   Patient: Kyle Roberson WUJ:[id number] DOB: Jul 15, 1940 DOA: 11/26/2022     14 DOS: the patient was seen and examined on 12/10/2022   Brief hospital course: 82yo with h/o CAD, stage 5 CKD, chronic diastolic CHF, BLE lymphedema, afib on Eliquis, PAH, and SDH presented on 9/5 with weight gain and SOB.  He was diagnosed with acute on chronic CHF and started on Lasix drip.  Cardiology is consulting and started IV midodrine drip.  RHC done on 9/11 with worsening renal function and decreased UOP so Lasix stopped.  Considering CVVHD but this is likely to lead to HD dependence.  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  He is on IV Lasix.  Assessment and Plan:  Acute on chronic diastolic congestive heart failure Pulmonary HTN Presented with volume overload with increasing weight up to 255 pounds. Dry weight around ~220 pounds Echo with preserved EF, indeterminate diastolic function, severe pulmonary HTN Cardiology consulting Received Lasix 60 mg IV once -> Lasix drip per nephrology/cardiology, was on 15 mg/hr but this was held due to worsening renal function Monitor input output and creatinine He does NOT wear oxygen at home, per wife Started on Farxiga transiently per Cardiology - this medication was stopped Transferred to ICU on 9/9 for IV milrinone gtt per [facility] heart failure MD.  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 Baseline stage 5 CKD, has now progressed to ESRD Significantly worse leading to transient CRRT but failed after this was stopped, likely to need lifelong HD if he  is able to tolerate it Nephrology consulting IV albumin per Nephrology Overall very poor prognosis Vascular surgery is willing to see him as an outpatient in a few weeks to see if he improves enough to tolerate surgical line placement Will need tunneled catheter and to start outpatient HD planning Getting HD again on 9/20 and then catheter placement next week with repeat HD on Monday   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 Recent A1c 7.3 Holding home glipizide Wife requested dc of SSI but this was added back to improve glycemic control   L ankle pain Fell in the ER Xray ordered and negative Will order CAM walker for use with ambulation   Morbid obesity Complicates overall prognosis Body mass index is 36.5 kg/m.Marland Kitchen  Weight loss should be encouraged Outpatient PCP/bariatric medicine f/u encouraged  Nutrition Problem: Increased nutrient needs Etiology: acute illness (CRRT) Signs/Symptoms: estimated needs   Pressure ulcer  Pressure Injury 12/03/22 Buttocks Right;Left Stage 1 -  Intact skin with non-blanchable redness of a localized area usually over a bony prominence. Purple/blue (Active)  12/03/22 0820  Location: Buttocks  Location Orientation: Right;Left  Staging: Stage 1 -  Intact skin with non-blanchable redness of a localized area usually over a bony prominence.  Wound Description (Comments): Purple/blue  Present on Admission:     Hypertension Continue doxazosin, hydralazine   Hyperlipidemia Continue rosuvastatin, Lovaza   CAD with remote PCI Continue Imdur No current c/o CP   History of paroxysmal a fib Bradycardia currently so not on rate controlling  medications On heparin drip for now, resume Eliquis when appropriate   Goals of care Patient appears disinterested in aggressive care but his wife is insistent She has declined palliative care consultation, "he's not there yet" He has a very poor overall prognosis based on  cardiorenal failure and is not a good long-term HD candidate Ongoing discussions are encouraged regarding code status as well as possibly EOL care       Consultants: Cardiology Nephrology PCCM Palliative care Surgery PT/OT [facility] team   Procedures: Echocardiogram 9/6 CVC 9/12 RHC on 9/11 CRRT HD 9/17, 9/18, planned for 9/19   Antibiotics: None      30 Day Unplanned Readmission Risk Score    Flowsheet Row ED to Hosp-Admission (Current) from 11/26/2022 in [facility]  30 Day Unplanned Readmission Risk Score (%) 26.7 Filed at 12/10/2022 0801       This score is the patient's risk of an unplanned readmission within 30 days of being discharged (0 -100%). The score is based on dignosis, age, lab data, medications, orders, and past utilization.   Low:  0-14.9   Medium: 15-21.9   High: 22-29.9   Extreme: 30 and above           Subjective: Getting a bath today during evaluation.  Wife present.  Beginning to understand that he will need ongoing HD.   Objective: Vitals:   12/10/22 0012 12/10/22 0320  BP: (!) 119/51 (!) 126/47  Pulse: (!) 43 (!) 45  Resp: 20 20  Temp: 97.6 F (36.4 C) 98.7 F (37.1 C)  SpO2: 95% 95%    Intake/Output Summary (Last 24 hours) at 12/10/2022 0830 Last data filed at 12/09/2022 2321 Gross per 24 hour  Intake 343.98 ml  Output 2100 ml  Net -1756.02 ml   Filed Weights   12/06/22 1400 12/08/22 1500 12/08/22 1749  Weight: 109.7 kg 114.7 kg 113.7 kg    Exam:   General:  Appears calm and comfortable and is in NAD, chronically ill appearing Eyes:  prominent hyperemic lower eyelids ENT:  grossly normal hearing, lips & tongue, mmm Neck:  no LAD, masses or thyromegaly Cardiovascular:  RRR Respiratory:   CTA bilaterally with no wheezes/rales/rhonchi.  Normal to mildly increased respiratory effort. Abdomen:  soft, NT, ND Skin:  chronic lymphedema, extending up onto lower thighs Musculoskeletal:  no bony  abnormality Psychiatric:  blunted mood and affect, speech limited but appropriate Neurologic:  grossly normal CN   Data Reviewed: I have reviewed the patient's lab results since admission.  Pertinent labs for today include:   Glucose 136 BUN 57/Creatinine 3.19/GFR 19 Phos 5.2 Albumin 3.1 WBC 6.2 Hgb 9.9     Family Communication: Wife was present throughout evalaution  Disposition: Status is: Inpatient Remains inpatient appropriate because: ongoing evaluation and treatment     Time spent: 50 minutes  Unresulted Labs (From admission, onward)     Start     Ordered   12/11/22 0500  Heparin level (unfractionated)  Tomorrow morning,   R       Question:  Specimen collection method  Answer:  Unit=Unit collect   12/10/22 0602   12/11/22 0500  CBC  Tomorrow morning,   R       Question:  Specimen collection method  Answer:  Unit=Unit collect   12/10/22 0602   12/04/22 0500  Renal function panel (daily at 0500)  Daily,   R     Question:  Specimen collection method  Answer:  Lab=Lab collect  12/03/22 1211             Author: Jonah Blue, MD 12/10/2022 8:30 AM  For on call review [email or web address].

## 2022-12-10 NOTE — Progress Notes (Signed)
Physical Therapy Treatment Patient Details Name: Kyle Roberson MRN: [id number] DOB: [birth year] Today's Date: 12/10/2022   History of Present Illness Kyle Roberson is an 81yoM who comes to [facility] from OP nephrology on 9/5 due to LEE and ABD swelling. PMH: CKD, CHF. As of 9/9 he was transferred to ICU for IV milrinone gtt per [facility] heart failure MD.    PT Comments  Patient is agreeable to PT with encouragement. Supportive spouse at the bedside. Patient continues to require physical assistance with bed mobility and transfers. Three standing bouts performed with rest breaks between bouts of standing. Pre-gait activity performed while standing with limited activity tolerance. Unable to progress walking this date. Recommend to continue PT to maximize independence and decrease caregiver burden.    If plan is discharge home, recommend the following: A lot of help with walking and/or transfers;A lot of help with bathing/dressing/bathroom;Assist for transportation;Help with stairs or ramp for entrance;Assistance with cooking/housework   Can travel by private vehicle     No  Equipment Recommendations  None recommended by PT    Recommendations for Other Services       Precautions / Restrictions Precautions Precautions: Fall Restrictions Weight Bearing Restrictions: No     Mobility  Bed Mobility Overal bed mobility: Needs Assistance Bed Mobility: Supine to Sit, Sit to Supine Rolling: +2 for physical assistance, Mod assist   Supine to sit: Mod assist, +2 for physical assistance Sit to supine: Max assist, +2 for physical assistance   General bed mobility comments: increased assistance required for return to  bed. cues for technique, task initiation with increased time and effort required    Transfers Overall transfer level: Needs assistance Equipment used: Rolling walker (2 wheels) Transfers: Sit to/from Stand Sit to Stand: Mod assist, +2 physical assistance           General  transfer comment: 3 standing bouts performed. patient required physical assistance for each stand. cues for anterior weight shifting    Ambulation/Gait             Pre-gait activities: marching in place performed with Min A +2 using rolling walker for 5 seconds x 2 bouts. emphasis on upright standing posture, hip extension and avoid flexed posture, increasing standing tolerance in preparation for standing. side steps performed to the right with Min A +2 person required for safety     Stairs             Wheelchair Mobility     Tilt Bed    Modified Rankin (Stroke Patients Only)       Balance Overall balance assessment: Needs assistance Sitting-balance support: Feet supported Sitting balance-Leahy Scale: Fair     Standing balance support: Bilateral upper extremity supported Standing balance-Leahy Scale: Poor Standing balance comment: external support required.                            Cognition Arousal: Alert Behavior During Therapy: WFL for tasks assessed/performed Overall Cognitive Status: Within Functional Limits for tasks assessed                                          Exercises      General Comments General comments (skin integrity, edema, etc.): heart rate up to the 60's with standing activity      Pertinent Vitals/Pain Pain Assessment Pain Assessment: Faces Faces Pain  Scale: Hurts little more Pain Location: LUE Pain Descriptors / Indicators: Discomfort, Grimacing Pain Intervention(s): Limited activity within patient's tolerance, Monitored during session, Repositioned    Home Living                          Prior Function            PT Goals (current goals can now be found in the care plan section) Acute Rehab PT Goals Patient Stated Goal: to get better PT Goal Formulation: With patient/family Time For Goal Achievement: 12/17/22 (care plan extended x 1 week) Potential to Achieve Goals:  Fair Progress towards PT goals: Progressing toward goals    Frequency    Min 1X/week      PT Plan      Co-evaluation PT/OT/SLP Co-Evaluation/Treatment: Yes Reason for Co-Treatment: Complexity of the patient's impairments (multi-system involvement);To address functional/ADL transfers PT goals addressed during session: Mobility/safety with mobility OT goals addressed during session: ADL's and self-care      AM-PAC PT "6 Clicks" Mobility   Outcome Measure  Help needed turning from your back to your side while in a flat bed without using bedrails?: A Lot Help needed moving from lying on your back to sitting on the side of a flat bed without using bedrails?: A Lot Help needed moving to and from a bed to a chair (including a wheelchair)?: A Lot Help needed standing up from a chair using your arms (e.g., wheelchair or bedside chair)?: A Lot Help needed to walk in hospital room?: A Lot Help needed climbing 3-5 steps with a railing? : Total 6 Click Score: 11    End of Session   Activity Tolerance: Patient limited by fatigue Patient left: in bed;with call bell/phone within reach;with bed alarm set Nurse Communication: Mobility status PT Visit Diagnosis: Unsteadiness on feet (R26.81);Muscle weakness (generalized) (M62.81)     Time: 1914-7829 PT Time Calculation (min) (ACUTE ONLY): 46 min  Charges:    $Therapeutic Activity: 23-37 mins PT General Charges $$ ACUTE PT VISIT: 1 Visit                     Donna Bernard, PT, MPT    Ina Homes 12/10/2022, 1:57 PM

## 2022-12-11 DIAGNOSIS — I5033 Acute on chronic diastolic (congestive) heart failure: Secondary | ICD-10-CM | POA: Diagnosis not present

## 2022-12-11 LAB — GLUCOSE, CAPILLARY
Glucose-Capillary: 94 mg/dL (ref 70–99)
Glucose-Capillary: 159 mg/dL — ABNORMAL HIGH (ref 70–99)
Glucose-Capillary: 167 mg/dL — ABNORMAL HIGH (ref 70–99)

## 2022-12-11 LAB — CBC
HCT: 29.9 % — ABNORMAL LOW (ref 39.0–52.0)
Hemoglobin: 9.7 g/dL — ABNORMAL LOW (ref 13.0–17.0)
MCH: 30.1 pg (ref 26.0–34.0)
MCHC: 32.4 g/dL (ref 30.0–36.0)
MCV: 92.9 fL (ref 80.0–100.0)
Platelets: 162 10*3/uL (ref 150–400)
RBC: 3.22 MIL/uL — ABNORMAL LOW (ref 4.22–5.81)
RDW: 14.6 % (ref 11.5–15.5)
WBC: 9.2 10*3/uL (ref 4.0–10.5)
nRBC: 0 % (ref 0.0–0.2)

## 2022-12-11 LAB — RENAL FUNCTION PANEL
Albumin: 3.2 g/dL — ABNORMAL LOW (ref 3.5–5.0)
Anion gap: 12 (ref 5–15)
BUN: 68 mg/dL — ABNORMAL HIGH (ref 8–23)
CO2: 25 mmol/L (ref 22–32)
Calcium: 8.3 mg/dL — ABNORMAL LOW (ref 8.9–10.3)
Chloride: 99 mmol/L (ref 98–111)
Creatinine, Ser: 3.67 mg/dL — ABNORMAL HIGH (ref 0.61–1.24)
GFR, Estimated: 16 mL/min — ABNORMAL LOW (ref >60)
Glucose, Bld: 101 mg/dL — ABNORMAL HIGH (ref 70–99)
Phosphorus: 6.1 mg/dL — ABNORMAL HIGH (ref 2.5–4.6)
Potassium: 4.5 mmol/L (ref 3.5–5.1)
Sodium: 136 mmol/L (ref 135–145)

## 2022-12-11 LAB — HEPARIN LEVEL (UNFRACTIONATED): Heparin Unfractionated: 0.3 IU/mL (ref 0.30–0.70)

## 2022-12-11 LAB — HEPATITIS B CORE ANTIBODY, TOTAL: Hep B Core Total Ab: NONREACTIVE

## 2022-12-11 MED ORDER — HEPARIN SODIUM (PORCINE) 1000 UNIT/ML IJ SOLN
2800.0000 [IU] | Freq: Once | INTRAMUSCULAR | Status: AC
  Administered 2022-12-11: 2800 [IU]

## 2022-12-11 MED ORDER — FUROSEMIDE 10 MG/ML IJ SOLN
120.0000 mg | Freq: Once | INTRAVENOUS | Status: AC
  Administered 2022-12-12: 120 mg via INTRAVENOUS
  Filled 2022-12-11 (×2): qty 12

## 2022-12-11 MED ORDER — HEPARIN SODIUM (PORCINE) 1000 UNIT/ML IJ SOLN
INTRAMUSCULAR | Status: AC
  Filled 2022-12-11: qty 10

## 2022-12-11 MED ORDER — EPOETIN ALFA 10000 UNIT/ML IJ SOLN
10000.0000 [IU] | INTRAMUSCULAR | Status: DC
  Administered 2022-12-11: 10000 [IU] via INTRAVENOUS
  Filled 2022-12-11 (×2): qty 1

## 2022-12-11 MED ORDER — EPOETIN ALFA 10000 UNIT/ML IJ SOLN
INTRAMUSCULAR | Status: AC
  Filled 2022-12-11: qty 1

## 2022-12-11 MED ORDER — HYDROXYZINE HCL 25 MG PO TABS
25.0000 mg | ORAL_TABLET | Freq: Three times a day (TID) | ORAL | Status: DC | PRN
  Administered 2022-12-13: 25 mg via ORAL
  Filled 2022-12-11 (×2): qty 1

## 2022-12-11 NOTE — Plan of Care (Signed)
  Problem: Coping: Goal: Ability to adjust to condition or change in health will improve Outcome: Progressing   Problem: Health Behavior/Discharge Planning: Goal: Ability to identify and utilize available resources and services will improve Outcome: Progressing Goal: Ability to manage health-related needs will improve Outcome: Progressing   Problem: Metabolic: Goal: Ability to maintain appropriate glucose levels will improve Outcome: Progressing   Problem: Health Behavior/Discharge Planning: Goal: Ability to manage health-related needs will improve Outcome: Progressing   Problem: Clinical Measurements: Goal: Ability to maintain clinical measurements within normal limits will improve Outcome: Progressing Goal: Cardiovascular complication will be avoided Outcome: Progressing   Problem: Elimination: Goal: Will not experience complications related to bowel motility Outcome: Progressing Goal: Will not experience complications related to urinary retention Outcome: Progressing   Problem: Cardiovascular: Goal: Ability to achieve and maintain adequate cardiovascular perfusion will improve Outcome: Progressing

## 2022-12-11 NOTE — Progress Notes (Signed)
Progress Note   Patient: Kyle Roberson ZOX:[id number] DOB: [birth year] DOA: 11/26/2022     15 DOS: the patient was seen and examined on 12/11/2022   Brief hospital course: 82yo with h/o CAD, stage 5 CKD, chronic diastolic CHF, BLE lymphedema, afib on Eliquis, PAH, and SDH presented on 9/5 with weight gain and SOB.  He was diagnosed with acute on chronic CHF and started on Lasix drip.  Cardiology is consulting and started IV midodrine drip.  RHC done on 9/11 with worsening renal function and decreased UOP so Lasix stopped.  Considering CVVHD but this is likely to lead to HD dependence.  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  He is on IV Lasix.  Assessment and Plan:  Acute on chronic diastolic congestive heart failure Pulmonary HTN Presented with volume overload with increasing weight up to 255 pounds. Dry weight around ~220 pounds Echo with preserved EF, indeterminate diastolic function, severe pulmonary HTN Cardiology consulting Received Lasix 60 mg IV once -> Lasix drip per nephrology/cardiology, was on 15 mg/hr but this was held due to worsening renal function Monitor input output and creatinine He does NOT wear oxygen at home, per wife Started on Farxiga transiently per Cardiology - this medication was stopped Transferred to ICU on 9/9 for IV milrinone gtt per [facility] heart failure MD.  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 Baseline stage 5 CKD, has now progressed to ESRD Significantly worse leading to transient CRRT but failed after this was stopped, likely to need lifelong HD if he  is able to tolerate it Nephrology consulting IV albumin per Nephrology Overall very poor prognosis Vascular surgery is willing to see him as an outpatient in a few weeks to see if he improves enough to tolerate surgical line placement Will need tunneled catheter and to start outpatient HD planning Getting HD again on 9/20 and then line holiday with plan for catheter placement 9/23 with repeat HD on Monday   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 Recent A1c 7.3 Holding home glipizide Wife requested dc of SSI but this was added back to improve glycemic control   L ankle pain Fell in the ER Xray ordered and negative Will order CAM walker for use with ambulation   Morbid obesity Complicates overall prognosis Body mass index is 36.5 kg/m.Marland Kitchen  Weight loss should be encouraged Outpatient PCP/bariatric medicine f/u encouraged  Nutrition Problem: Increased nutrient needs Etiology: acute illness (CRRT) Signs/Symptoms: estimated needs   Pressure ulcer  Pressure Injury 12/03/22 Buttocks Right;Left Stage 1 -  Intact skin with non-blanchable redness of a localized area usually over a bony prominence. Purple/blue (Active)  12/03/22 0820  Location: Buttocks  Location Orientation: Right;Left  Staging: Stage 1 -  Intact skin with non-blanchable redness of a localized area usually over a bony prominence.  Wound Description (Comments): Purple/blue  Present on Admission:     Hypertension Continue doxazosin, hydralazine   Hyperlipidemia Continue rosuvastatin, Lovaza   CAD with remote PCI Continue Imdur No current c/o CP   History of paroxysmal a fib Bradycardia currently so  not on rate controlling medications On heparin drip for now, resume Eliquis when appropriate   Goals of care Patient appears disinterested in aggressive care but his wife is insistent She has declined palliative care consultation, "he's not there yet" He has a very poor overall  prognosis based on cardiorenal failure and is not a good long-term HD candidate Ongoing discussions are encouraged regarding code status as well as possibly EOL care       Consultants: Cardiology Nephrology PCCM Palliative care Surgery PT/OT [facility] team   Procedures: Echocardiogram 9/6 CVC 9/12 RHC on 9/11 CRRT HD 9/17, 9/18, planned for 9/19   Antibiotics: None       30 Day Unplanned Readmission Risk Score    Flowsheet Row ED to Hosp-Admission (Current) from 11/26/2022 in [facility] PCU  30 Day Unplanned Readmission Risk Score (%) 27.05 Filed at 12/11/2022 0801       This score is the patient's risk of an unplanned readmission within 30 days of being discharged (0 -100%). The score is based on dignosis, age, lab data, medications, orders, and past utilization.   Low:  0-14.9   Medium: 15-21.9   High: 22-29.9   Extreme: 30 and above           Subjective: He has no specific complaints.  Wife is concerned about heat rash and constant sweating on his back.   Objective: Vitals:   12/11/22 1230 12/11/22 1242  BP: (!) 117/49   Pulse: (!) 37 (!) 41  Resp: (!) 35 (!) 27  Temp: 97.7 F (36.5 C)   SpO2: 95%     Intake/Output Summary (Last 24 hours) at 12/11/2022 1410 Last data filed at 12/11/2022 1215 Gross per 24 hour  Intake 480 ml  Output 3050 ml  Net -2570 ml   Filed Weights   12/08/22 1749 12/11/22 0859 12/11/22 1239  Weight: 113.7 kg 30.5 kg 32 kg    Exam:  General:  Appears calm and comfortable and is in NAD, chronically ill appearing, appears somewhat better today Eyes:  less prominent hyperemic lower eyelids ENT:  grossly normal hearing, lips & tongue, mmm Neck:  no LAD, masses or thyromegaly Cardiovascular:  RRR Respiratory:   CTA bilaterally with no wheezes/rales/rhonchi.  Normal to mildly increased respiratory effort. Abdomen:  soft, NT, ND Skin:  chronic lymphedema, extending up onto lower thighs Musculoskeletal:  no bony  abnormality Psychiatric:  blunted mood and affect, speech limited but appropriate Neurologic:  grossly normal CN   Data Reviewed: I have reviewed the patient's lab results since admission.  Pertinent labs for today include:   BUN 68/Creatinine 3.67/GFR 16 WBC 9.2 Hgb 9.7     Family Communication: Wife was present in the room and we chatted prior to my seeing patient in HD  Disposition: Status is: Inpatient Remains inpatient appropriate because: ongoing treatment     Time spent: 50 minutes  Unresulted Labs (From admission, onward)     Start     Ordered   12/12/22 0500  Heparin level (unfractionated)  Tomorrow morning,   R       Question:  Specimen collection method  Answer:  Lab=Lab collect   12/11/22 1320   12/12/22 0500  CBC  Tomorrow morning,   R       Question:  Specimen collection method  Answer:  Lab=Lab collect   12/11/22 1320   12/11/22 1116  Hepatitis B core antibody, total  Once,   R  Question:  Specimen collection method  Answer:  Lab=Lab collect   12/11/22 1115   12/04/22 0500  Renal function panel (daily at 0500)  Daily,   R     Question:  Specimen collection method  Answer:  Lab=Lab collect   12/03/22 1211             Author: Jonah Blue, MD 12/11/2022 2:10 PM  For on call review [email or web address].

## 2022-12-11 NOTE — Consult Note (Signed)
ANTICOAGULATION CONSULT NOTE  Pharmacy Consult for IV Heparin Indication: atrial fibrillation  Patient Measurements: Height: 5\' 8"  (172.7 cm) Weight: 32 kg (70 lb 8.8 oz) IBW/kg (Calculated) : 68.4 Heparin Dosing Weight: 93 kg  Labs: Recent Labs    12/09/22 0400 12/10/22 0444 12/11/22 0440 12/11/22 1230  HGB 9.5* 9.9* 9.7*  --   HCT 29.5* 31.8* 29.9*  --   PLT 154 158 162  --   HEPARINUNFRC 0.41 0.38  --  0.30  CREATININE 3.57* 3.19* 3.67*  --     Estimated Creatinine Clearance: 6.9 mL/min (A) (by C-G formula based on SCr of 3.67 mg/dL (H)).   Medical History: Past Medical History:  Diagnosis Date   AKI (acute kidney injury) (HCC) 07/06/2018   CAD (coronary artery disease)    a. Remote PCI/stenting to LAD w PTCA Diagnoal. RCA 60%; b.  2005/2008 Cardiolites w/ reportedly mild ischemia in Diag territory-->Med rx.   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 04/2019 Echo: EF 55-60%, no rwma, nl RV fxn, RVSP 55.57mmHg. Mod dil LA. Mild-mod MR. Mod dil PA.   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 04/2019 Echo: RVSP 55.75mmHg.   Permanent atrial fibrillation (HCC)    a. CHA2DS2VASc = 5-->dose adjusted eliquis (age/creat).   Subdural hematoma (HCC)    a. 01/2021 in setting of fall s/p L frontotemporal craniotomy.    Medications:  Apixaban 2.5 mg BID (last dose 9/12 at 1100)  Assessment: 82 y/o M with PMH as above admitted with acute on chronic diastolic CHF / RV failure complicated by CKD / cardiorenal syndrome. Patient is now being started on CRRT. Pharmacy consulted to transition apixaban to heparin for now in case further procedures required.  9/18 0400 0.41 9/19 0444 0.38 9/20 1230  0.3  Goal of Therapy:  Heparin level 0.3-0.7 units/ml Monitor platelets by anticoagulation protocol: Yes    Plan:  Heparin level is therapeutic. Will continue heparin infusion at 1200 units/hr. Recheck heparin level and CBC with AM labs.   Paschal Dopp, PharmD,  12/11/2022 1:17 PM

## 2022-12-11 NOTE — Progress Notes (Addendum)
[facility]  ROUNDING NOTE   Subjective:   Patient seen and evaluated during dialysis   HEMODIALYSIS FLOWSHEET:  Blood Flow Rate (mL/min): 299 mL/min Arterial Pressure (mmHg): -197.77 mmHg Venous Pressure (mmHg): 171.71 mmHg TMP (mmHg): 20.2 mmHg Ultrafiltration Rate (mL/min): 933 mL/min Dialysate Flow Rate (mL/min): 299 ml/min  Tolerating treatment well  UOP    Objective:  Vital signs in last 24 hours:  Temp:  [97.3 F (36.3 C)-98 F (36.7 C)] 97.6 F (36.4 C) (09/20 0854) Pulse Rate:  [42-56] 47 (09/20 1000) Resp:  [16-35] 22 (09/20 1000) BP: (109-144)/(44-53) 128/51 (09/20 1000) SpO2:  [91 %-100 %] 100 % (09/20 1000) Weight:  [30.5 kg] 30.5 kg (09/20 0859)  Weight change:  Filed Weights   12/08/22 1500 12/08/22 1749 12/11/22 0859  Weight: 114.7 kg 113.7 kg 30.5 kg    Intake/Output: I/O last 3 completed shifts: In: 768 [P.O.:720; I.V.:48] Out: 1600 [Urine:1600]   Intake/Output this shift:  Total I/O In: -  Out: 300 [Urine:300]  Physical Exam: General: NAD  Head: Normocephalic, atraumatic. Moist oral mucosal membranes  Eyes: Anicteric  Neck: Supple  Lungs:  Clear to auscultation  Heart: bradycardia  Abdomen:  Soft, nontender  Extremities:  ++ peripheral edema.  Neurologic: Nonfocal, moving all four extremities  Skin: No lesions  Access: LIJ temp HD catheter    Basic Metabolic Panel: Recent Labs  Lab 12/05/22 0259 12/05/22 1832 12/06/22 0317 12/06/22 1217 12/06/22 2209 12/07/22 0328 12/08/22 0604 12/08/22 1613 12/09/22 0400 12/10/22 0444 12/11/22 0440  NA 135   < > 134*   < > 134* 133* 136 135 135 135 136  K 4.2   < > 4.3   < > 4.3 4.3 4.3 3.9 3.9 4.3 4.5  CL 99   < > 100   < > 98 99 102 99 99 99 99  CO2 26   < > 25   < > 25 25 24 25 25 26 25   GLUCOSE 172*   < > 118*   < > 195* 107* 151* 167* 132* 136* 101*  BUN 41*   < > 51*   < > 63* 59* 79* 70* 68* 57* 68*  CREATININE 1.90*   < > 2.43*   < > 3.06* 3.21* 3.99*  3.39* 3.57* 3.19* 3.67*  CALCIUM 8.0*   < > 8.2*   < > 8.5* 8.5* 8.6* 8.3* 8.4* 8.3* 8.3*  MG 2.6*  --  2.7*  --  2.6* 2.5*  --   --   --   --   --   PHOS 2.2*   < > 3.9   < >  --  5.1* 6.6* 5.3* 5.5* 5.2* 6.1*   < > = values in this interval not displayed.    Liver Function Tests: Recent Labs  Lab 12/08/22 0604 12/08/22 1613 12/09/22 0400 12/10/22 0444 12/11/22 0440  ALBUMIN 3.2* 3.2* 3.1* 3.1* 3.2*   No results for input(s): "LIPASE", "AMYLASE" in the last 168 hours. No results for input(s): "AMMONIA" in the last 168 hours.  CBC: Recent Labs  Lab 12/08/22 0604 12/08/22 1613 12/09/22 0400 12/10/22 0444 12/11/22 0440  WBC 6.0 5.1 6.1 6.2 9.2  HGB 9.8* 9.4* 9.5* 9.9* 9.7*  HCT 31.7* 30.0* 29.5* 31.8* 29.9*  MCV 95.2 93.8 93.4 94.1 92.9  PLT 162 153 154 158 162    Cardiac Enzymes: No results for input(s): "CKTOTAL", "CKMB", "CKMBINDEX", "TROPONINI" in the last 168 hours.  BNP: Invalid input(s): "POCBNP"  CBG:  Recent Labs  Lab 12/10/22 0839 12/10/22 1100 12/10/22 1538 12/10/22 2022 12/11/22 0722  GLUCAP 115* 177* 222* 205* 94    Microbiology: Results for orders placed or performed during the hospital encounter of 11/26/22  MRSA Next Gen by PCR, Nasal     Status: None   Collection Time: 11/30/22  4:10 PM   Specimen: Nasal Mucosa; Nasal Swab  Result Value Ref Range Status   MRSA by PCR Next Gen NOT DETECTED NOT DETECTED Final    Comment: (NOTE) The GeneXpert MRSA Assay (FDA approved for NASAL specimens only), is one component of a comprehensive MRSA colonization surveillance program. It is not intended to diagnose MRSA infection nor to guide or monitor treatment for MRSA infections. Test performance is not FDA approved in patients less than 67 years old. Performed at [facility], [address] [place], [place] [address]     Coagulation Studies: No results for input(s): "LABPROT", "INR" in the last 72 hours.   Urinalysis: No results  for input(s): "COLORURINE", "LABSPEC", "PHURINE", "GLUCOSEU", "HGBUR", "BILIRUBINUR", "KETONESUR", "PROTEINUR", "UROBILINOGEN", "NITRITE", "LEUKOCYTESUR" in the last 72 hours.  Invalid input(s): "APPERANCEUR"    Imaging: No results found.   Medications:    sodium chloride     heparin 1,200 Units/hr (12/10/22 2107)    vitamin C  500 mg Oral BID   Chlorhexidine Gluconate Cloth  6 each Topical Daily   cholecalciferol  2,000 Units Oral Daily   docusate sodium  100 mg Oral BID   doxazosin  1 mg Oral BID   feeding supplement (NEPRO CARB STEADY)  237 mL Oral TID BM   insulin aspart  0-15 Units Subcutaneous TID AC & HS   insulin aspart  3 Units Subcutaneous TID WC   multivitamin  1 tablet Oral QHS   neomycin-polymyxin b-dexamethasone  1 Application Right Eye TID   omega-3 acid ethyl esters  1 g Oral Daily   rosuvastatin  10 mg Oral Once per day on Monday Wednesday Friday   sodium chloride flush  3 mL Intravenous Q12H   sodium chloride, acetaminophen **OR** acetaminophen, calamine, fentaNYL (SUBLIMAZE) injection, hydrocortisone cream, ipratropium-albuterol, ondansetron **OR** ondansetron (ZOFRAN) IV, oxyCODONE, polyethylene glycol, sodium chloride flush, traZODone  Assessment/ Plan:  [name] is a 82 y.o.  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 Due to progression of kidney disease and outpatient diuretic therapy failure, we feel this patient has progressed to end stage renal disease.  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 CRRT from 9/12 to 9/13 - Receiving third dialysis treatment today, UF goal 1.5-2L as  tolerated.  - Responded well to IV diuresis yesterday, 1L urine output.   - Will remove HD temp cath today and allow for line holiday. Vascular consulted to place permcath early next week - Renal navigator aware of patient and will begin outpatient clinic placement  Acute exacerbation of chronic diastolic congestive heart failure: complicated with acute respiratory failure and pulmonary hypertension. Currently with junctional rhythm. - Room air - Will manage fluid with dialysis and duresis  Diabetes mellitus type II with chronic kidney disease - holding empagliflozin    LOS: 15 Kyle Roberson 9/20/202410:30 AM

## 2022-12-11 NOTE — Progress Notes (Signed)
   Informed consent signed and in chart.    TX duration: 3 hours     Hand-off given to patient's nurse.    Access used: L internal jugular CVC Access issues: None   Total UF removed: 2000 mL Medication(s) given: None Post HD VS: WDL x bradycardia Post HD weight: 32.0 kg     [facility] D[facility] Unit

## 2022-12-11 NOTE — Progress Notes (Signed)
PT Cancellation Note  Patient Details Name: Kyle Roberson MRN: [id number] DOB: [birth year]   Cancelled Treatment:     Pt is off floor for HD. Acute PT will continue to follow per current POC.   Rushie Chestnut 12/11/2022, 9:22 AM

## 2022-12-11 NOTE — Progress Notes (Signed)
Patient ID: Kyle Roberson, male   DOB: 1940/12/11, 82 y.o.   MRN: [id number]     Advanced Heart Failure Rounding Note  PCP-Cardiologist: Julien Nordmann, MD   Subjective:    Seen in HD. I also spoke with his wife in room.  Tolerating HD well. More alert. BP stable. No SOB, orthopnea or PND  Continues with stable bradycardia    Objective:   Weight Range: 30.5 kg Body mass index is 10.22 kg/m.   Vital Signs:   Temp:  [97.3 F (36.3 C)-98 F (36.7 C)] 97.6 F (36.4 C) (09/20 0854) Pulse Rate:  [41-56] 50 (09/20 1130) Resp:  [17-33] 26 (09/20 1130) BP: (109-144)/(39-53) 123/46 (09/20 1130) SpO2:  [91 %-100 %] 100 % (09/20 1130) Weight:  [30.5 kg] 30.5 kg (09/20 0859) Last BM Date : 12/10/22  Weight change: Filed Weights   12/08/22 1500 12/08/22 1749 12/11/22 0859  Weight: 114.7 kg 113.7 kg 30.5 kg   Intake/Output:   Intake/Output Summary (Last 24 hours) at 12/11/2022 1150 Last data filed at 12/11/2022 0724 Gross per 24 hour  Intake 480 ml  Output 1050 ml  Net -570 ml    Physical Exam   General:  Sitting up in bed in HD No resp difficulty HEENT: normal Neck: supple. LIJ HD cath  Carotids 2+ bilat; no bruits. No lymphadenopathy or thryomegaly appreciated. Cor: irreg 2/6 TR Lungs: clear Abdomen: obese soft, nontender, nondistended. No hepatosplenomegaly. No bruits or masses. Good bowel sounds. Extremities: no cyanosis, clubbing, rash, severe stasis dermatitis. mild edema Neuro: alert & orientedx3, cranial nerves grossly intact. moves all 4 extremities w/o difficulty. Affect pleasant   Telemetry   Atrial fibrillation rate 40-50ss, some 30s overnight (personally reviewed)  Labs    Labs and imaging reviewed in Epic.  Medications:   Scheduled Medications:  vitamin C  500 mg Oral BID   Chlorhexidine Gluconate Cloth  6 each Topical Daily   cholecalciferol  2,000 Units Oral Daily   docusate sodium  100 mg Oral BID   doxazosin  1 mg Oral BID   epoetin  (EPOGEN/PROCRIT) injection  10,000 Units Intravenous Q M,W,F-HD   feeding supplement (NEPRO CARB STEADY)  237 mL Oral TID BM   insulin aspart  0-15 Units Subcutaneous TID AC & HS   insulin aspart  3 Units Subcutaneous TID WC   multivitamin  1 tablet Oral QHS   neomycin-polymyxin b-dexamethasone  1 Application Right Eye TID   omega-3 acid ethyl esters  1 g Oral Daily   rosuvastatin  10 mg Oral Once per day on Monday Wednesday Friday   sodium chloride flush  3 mL Intravenous Q12H    Infusions:  sodium chloride     heparin 1,200 Units/hr (12/10/22 2107)    PRN Medications: sodium chloride, acetaminophen **OR** acetaminophen, calamine, fentaNYL (SUBLIMAZE) injection, hydrocortisone cream, ipratropium-albuterol, ondansetron **OR** ondansetron (ZOFRAN) IV, oxyCODONE, polyethylene glycol, sodium chloride flush, traZODone  Assessment/Plan   1. Acute on chronic diastolic CHF: With prominent RV failure.  Echo this admission with EF 55-60%, mild LVH, moderate RV enlargement with mildly decreased RV systolic function, PASP 73 mmHg, severe LAE, mild-moderate TR, IVC dilated.  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 With significant volume overload, he was started on milrinone for RV support (no PICC with CKD IV) and diuresed with Lasix gtt.  Initial good response but UOP fell off significantly and creatinine up to 4.2, started CVVH on 9/12.  RHC 9/11 showed both right and left sided failure but  RV failure predominated with RA pressure > 20. Milrinone was weaned and patient is currently showing to be HD dependent. Seen by surgery for consideration of PD catheter, but not a candidate at this time - Volume status well maintained with HD + oral lasix. Management of HD and diuretics per Renal - Agree with placement of tunneled cath - PD may be an option down the road, but not a candidate currently  2. AKI on CKD stage IV: Complicates CHF.   As above, difficult situation with severe  predominantly RV failure/volume overload and cardiorenal syndrome.  Started on CVVH on 9/12 with UOP falling off despite high doses of diuretics, transitioned to iHD. - Resumed on iHD, his frailty and cardiopulmonary comorbid conditions make long term dialysis difficult. Not a PD candidate at this time. May need to revisit when more euvolemic - Management as above  3.  Atrial fibrillation: Permanent. Bradycardic currently - Apixaban 2.5 bid on hold. Continue heparin for now.  D/w PharmD - having bradycardia. - Ventricular rates 40-50s but go into 60-70 range on standing. Nighttime brady into 30s is asymptomatic - No role for PPM currently especially in light of need for HD and additional nidus for blood stream infection  4. CAD: Remote PCI to LAD and diagonal.  .  - No s/s angina Continue statin - No ASA with apixaban use.   5. 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   - Management as above  HF/cardiology will follow at a distance. Will see again Monday unless called.    Length of Stay: 15  Arvilla Meres, MD  12/11/2022, 11:50 AM  Advanced Heart Failure Team Pager [phone] (M-F; 7a - 5p)  Please contact [facility] for night-coverage after hours (5p -7a ) and weekends on [email or web address]

## 2022-12-11 NOTE — Progress Notes (Addendum)
Pt declines lab draw, labs drawn on unit  Received call this shift from lab, too early to draw heparin w/ AM labs

## 2022-12-12 DIAGNOSIS — I5033 Acute on chronic diastolic (congestive) heart failure: Secondary | ICD-10-CM | POA: Diagnosis not present

## 2022-12-12 LAB — RENAL FUNCTION PANEL
Albumin: 3.3 g/dL — ABNORMAL LOW (ref 3.5–5.0)
Anion gap: 10 (ref 5–15)
BUN: 46 mg/dL — ABNORMAL HIGH (ref 8–23)
CO2: 26 mmol/L (ref 22–32)
Calcium: 8.4 mg/dL — ABNORMAL LOW (ref 8.9–10.3)
Chloride: 98 mmol/L (ref 98–111)
Creatinine, Ser: 3.02 mg/dL — ABNORMAL HIGH (ref 0.61–1.24)
GFR, Estimated: 20 mL/min — ABNORMAL LOW (ref >60)
Glucose, Bld: 92 mg/dL (ref 70–99)
Phosphorus: 4.4 mg/dL (ref 2.5–4.6)
Potassium: 4.3 mmol/L (ref 3.5–5.1)
Sodium: 134 mmol/L — ABNORMAL LOW (ref 135–145)

## 2022-12-12 LAB — CBC
HCT: 31.7 % — ABNORMAL LOW (ref 39.0–52.0)
Hemoglobin: 10.1 g/dL — ABNORMAL LOW (ref 13.0–17.0)
MCH: 29.7 pg (ref 26.0–34.0)
MCHC: 31.9 g/dL (ref 30.0–36.0)
MCV: 93.2 fL (ref 80.0–100.0)
Platelets: 168 10*3/uL (ref 150–400)
RBC: 3.4 MIL/uL — ABNORMAL LOW (ref 4.22–5.81)
RDW: 14.7 % (ref 11.5–15.5)
WBC: 8.5 10*3/uL (ref 4.0–10.5)
nRBC: 0 % (ref 0.0–0.2)

## 2022-12-12 LAB — GLUCOSE, CAPILLARY
Glucose-Capillary: 95 mg/dL (ref 70–99)
Glucose-Capillary: 116 mg/dL — ABNORMAL HIGH (ref 70–99)
Glucose-Capillary: 155 mg/dL — ABNORMAL HIGH (ref 70–99)

## 2022-12-12 LAB — HEPARIN LEVEL (UNFRACTIONATED): Heparin Unfractionated: 0.3 IU/mL (ref 0.30–0.70)

## 2022-12-12 NOTE — Plan of Care (Signed)
  Problem: Education: Goal: Ability to describe self-care measures that may prevent or decrease complications (Diabetes Survival Skills Education) will improve Outcome: Progressing Goal: Individualized Educational Video(s) Outcome: Progressing   Problem: Coping: Goal: Ability to adjust to condition or change in health will improve Outcome: Progressing   Problem: Fluid Volume: Goal: Ability to maintain a balanced intake and output will improve Outcome: Progressing   Problem: Skin Integrity: Goal: Risk for impaired skin integrity will decrease Outcome: Progressing   Problem: Nutritional: Goal: Maintenance of adequate nutrition will improve Outcome: Progressing Goal: Progress toward achieving an optimal weight will improve Outcome: Progressing   Problem: Tissue Perfusion: Goal: Adequacy of tissue perfusion will improve Outcome: Progressing   Problem: Education: Goal: Knowledge of General Education information will improve Description: Including pain rating scale, medication(s)/side effects and non-pharmacologic comfort measures Outcome: Progressing   Problem: Clinical Measurements: Goal: Ability to maintain clinical measurements within normal limits will improve Outcome: Progressing Goal: Will remain free from infection Outcome: Progressing Goal: Diagnostic test results will improve Outcome: Progressing Goal: Respiratory complications will improve Outcome: Progressing Goal: Cardiovascular complication will be avoided Outcome: Progressing   Problem: Activity: Goal: Risk for activity intolerance will decrease Outcome: Progressing   Problem: Nutrition: Goal: Adequate nutrition will be maintained Outcome: Progressing   Problem: Pain Managment: Goal: General experience of comfort will improve Outcome: Progressing   Problem: Safety: Goal: Ability to remain free from injury will improve Outcome: Progressing   Problem: Skin Integrity: Goal: Risk for impaired skin  integrity will decrease Outcome: Progressing   Problem: Cardiovascular: Goal: Ability to achieve and maintain adequate cardiovascular perfusion will improve Outcome: Progressing Goal: Vascular access site(s) Level 0-1 will be maintained Outcome: Progressing   Problem: Activity: Goal: Ability to return to baseline activity level will improve Outcome: Progressing

## 2022-12-12 NOTE — Consult Note (Signed)
ANTICOAGULATION CONSULT NOTE  Pharmacy Consult for IV Heparin Indication: atrial fibrillation  Patient Measurements: Height: 5\' 8"  (172.7 cm) Weight: 32 kg (70 lb 8.8 oz) IBW/kg (Calculated) : 68.4 Heparin Dosing Weight: 93 kg  Labs: Recent Labs    12/10/22 0444 12/11/22 0440 12/11/22 1230 12/12/22 0552  HGB 9.9* 9.7*  --  10.1*  HCT 31.8* 29.9*  --  31.7*  PLT 158 162  --  168  HEPARINUNFRC 0.38  --  0.30 0.30  CREATININE 3.19* 3.67*  --  3.02*    Estimated Creatinine Clearance: 8.4 mL/min (A) (by C-G formula based on SCr of 3.02 mg/dL (H)).   Medical History: Past Medical History:  Diagnosis Date   AKI (acute kidney injury) (HCC) 07/06/2018   CAD (coronary artery disease)    a. Remote PCI/stenting to LAD w PTCA Diagnoal. RCA 60%; b.  2005/2008 Cardiolites w/ reportedly mild ischemia in Diag territory-->Med rx.   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 04/2019 Echo: EF 55-60%, no rwma, nl RV fxn, RVSP 55.48mmHg. Mod dil LA. Mild-mod MR. Mod dil PA.   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 04/2019 Echo: RVSP 55.71mmHg.   Permanent atrial fibrillation (HCC)    a. CHA2DS2VASc = 5-->dose adjusted eliquis (age/creat).   Subdural hematoma (HCC)    a. 01/2021 in setting of fall s/p L frontotemporal craniotomy.    Medications:  Apixaban 2.5 mg BID (last dose 9/12 at 1100)  Assessment: 82 y/o M with PMH as above admitted with acute on chronic diastolic CHF / RV failure complicated by CKD / cardiorenal syndrome. Patient is now being started on CRRT. Pharmacy consulted to transition apixaban to heparin for now in case further procedures required.  9/18 0400 0.41 9/19 0444 0.38 9/20  1230 0.3 9/21 0552 0.3  Goal of Therapy:  Heparin level 0.3-0.7 units/ml Monitor platelets by anticoagulation protocol: Yes    Plan:  Heparin level is therapeutic. Will continue heparin infusion at 1200 units/hr. Recheck heparin level and CBC with AM labs.   Otelia Sergeant, PharmD, [facility] 12/12/2022 6:53 AM

## 2022-12-12 NOTE — Progress Notes (Signed)
PROGRESS NOTE    Kyle Roberson   ZDG:[id number] DOB: Jun 01, 1940  DOA: 11/26/2022 Date of Service: 12/12/22 which is hospital day 16  PCP: [name], MD   HPI:  [phone] with h/o CAD, stage 5 CKD, chronic diastolic CHF, BLE lymphedema, afib on Eliquis, PAH, and SDH presented on 09/05 with weight gain and SOB.    Hospital course / major events:  He was diagnosed with acute on chronic CHF and started on Lasix drip.  Cardiology was consulted. Echo this admission with EF 55-60%, mild LVH, moderate RV enlargement with mildly decreased RV systolic function, PASP 73 mmHg, severe LAE, mild-moderate TR, IVC dilated.  started IV midodrine drip 09/09.   RHC done on 09/11 with elevated R > L filling pressures, mostly right-sided, worsening renal function and decreased UOP so Lasix stopped.  Started on CRRT 09/12 for additional volume removal and completed this. 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  He is on IV Lasix. Has transitioned to TID HD Remains on heparin gtt for Afib, has been bradycardic w/ ventricular rates 40-50s but go into 60-70 range on standing.  Consultants:  Cardiology - advanced HF team  Nephrology  PCCM Palliative care Surgery PT/OT [facility] team  Procedures: Echocardiogram 9/6 CVC 9/12 RHC on 9/11 CRRT HD 9/17, 9/18, 09/20       ASSESSMENT & PLAN:   Acute on chronic diastolic congestive heart failure Pulmonary HTN, severe - group 2 PH (from elevated LVEDP  Presented with volume overload with increasing weight up to 255 pounds. 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 Bradycardic currently Apixaban 2.5 bid on hold. Continue heparin for now per cardiology  No role for PPM currently especially in light of need for HD and additional nidus for blood stream infection   Anemia of chronic inflammation, cardiorenal disease ESRD Starting Epo  Hyperphosphatemia Binders per nephro  L ankle pain Fell in the ER Xray ordered and negative CAM walker for use with ambulation   Hypertension Continue doxazosin, hydralazine   Hyperlipidemia Continue rosuvastatin, Lovaza   CAD with remote PCI Continue Imdur, statin No ASA No current c/o CP   History of paroxysmal a fib Bradycardia currently so not on rate controlling medications On heparin drip for now, plan resume Eliquis per cardiology    Goals of care Patient appears disinterested in aggressive care but his wife is insistent She has declined palliative care consultation, "he's not there yet" He has a very poor overall prognosis based on cardiorenal failure and is not a good long-term HD candidate  Ongoing discussions are encouraged regarding code status as well as possibly EOL care    Morbid obesity Complicates overall prognosis Body mass index is 36.5 kg/m.Marland Kitchen  Nutrition Problem: Increased nutrient needs Etiology: acute illness (CRRT) Signs/Symptoms: estimated needs   Pressure ulcer  Pressure Injury 12/03/22 Buttocks Right;Left  Stage 1 -  Intact skin with non-blanchable redness of a localized area usually over a bony prominence. Purple/blue (Active)  12/03/22 0820  Location: Buttocks  Location Orientation: Right;Left  Staging: Stage 1 -  Intact skin with non-blanchable redness of a localized area usually over a bony prominence.  Wound Description (Comments): Purple/blue  Present on Admission:           Morbid obesity based on BMI: Body mass index is 10.73 kg/m.  And serious comorbid medical conditions   DVT prophylaxis: on heparin gtt IV fluids: no continuous IV fluids  Nutrition: low sodium Central lines / invasive devices:  [facility] HD cath   Code Status: FULL CODE ACP documentation reviewed: 12/12/22 none on file in [facility]  TOC needs: TBD. Renal navigator working on HD outpatient  Barriers to dispo / significant pending items: permcath placement early next week             Subjective / Brief ROS:  Patient reports no concerns today other than feeling tired Denies CP/SOB (apparent slight increased WOB but he states he feels fine no breathing trouble)  Pain controlled.  Denies new weakness.  Tolerating diet.  Reports no concerns w/ urination/defecation.   Family Communication: none at this time     Objective Findings:  Vitals:   12/12/22 1000 12/12/22 1100 12/12/22 1200 12/12/22 1300  BP:   (!) 147/50 (!) 125/59  Pulse: (!) 54 (!) 55 (!) 44 (!) 50  Resp: (!) 25 (!) 35 (!) 29 (!) 26  Temp:      TempSrc:      SpO2: 94% 93% 100% 97%  Weight:      Height:        Intake/Output Summary (Last 24 hours) at 12/12/2022 1416 Last data filed at 12/12/2022 0014 Gross per 24 hour  Intake 400 ml  Output 200 ml  Net 200 ml   Filed Weights   12/08/22 1749 12/11/22 0859 12/11/22 1239  Weight: 113.7 kg 30.5 kg 32 kg    Examination:  Physical Exam Constitutional:      General: He is not in acute distress.    Appearance: He is ill-appearing.  Cardiovascular:     Rate and Rhythm: Normal  rate.     Heart sounds: Murmur heard.  Pulmonary:     Effort: Accessory muscle usage present. No respiratory distress.     Breath sounds: Examination of the right-lower field reveals decreased breath sounds. Examination of the left-lower field reveals decreased breath sounds. Decreased breath sounds present.  Abdominal:     Palpations: Abdomen is soft.  Skin:    General: Skin is warm and dry.  Neurological:     General: No focal deficit present.     Mental Status: He is alert and oriented to person, place, and time. Mental status is at baseline.  Psychiatric:        Mood and Affect: Mood normal.        Behavior: Behavior normal.          Scheduled Medications:   vitamin C  500 mg Oral BID   Chlorhexidine Gluconate Cloth  6 each Topical Daily   cholecalciferol  2,000 Units Oral Daily  docusate sodium  100 mg Oral BID   doxazosin  1 mg Oral BID   epoetin (EPOGEN/PROCRIT) injection  10,000 Units Intravenous Q M,W,F-HD   feeding supplement (NEPRO CARB STEADY)  237 mL Oral TID BM   insulin aspart  0-15 Units Subcutaneous TID AC & HS   insulin aspart  3 Units Subcutaneous TID WC   multivitamin  1 tablet Oral QHS   neomycin-polymyxin b-dexamethasone  1 Application Right Eye TID   omega-3 acid ethyl esters  1 g Oral Daily   rosuvastatin  10 mg Oral Once per day on Monday Wednesday Friday   sodium chloride flush  3 mL Intravenous Q12H    Continuous Infusions:  sodium chloride     heparin 1,200 Units/hr (12/10/22 2107)    PRN Medications:  sodium chloride, acetaminophen **OR** acetaminophen, calamine, fentaNYL (SUBLIMAZE) injection, hydrocortisone cream, hydrOXYzine, ipratropium-albuterol, ondansetron **OR** ondansetron (ZOFRAN) IV, oxyCODONE, polyethylene glycol, sodium chloride flush, traZODone  Antimicrobials from admission:  Anti-infectives (From admission, onward)    None           Data Reviewed:  I have personally reviewed the following...  CBC: Recent  Labs  Lab 12/08/22 1613 12/09/22 0400 12/10/22 0444 12/11/22 0440 12/12/22 0552  WBC 5.1 6.1 6.2 9.2 8.5  HGB 9.4* 9.5* 9.9* 9.7* 10.1*  HCT 30.0* 29.5* 31.8* 29.9* 31.7*  MCV 93.8 93.4 94.1 92.9 93.2  PLT 153 154 158 162 168   Basic Metabolic Panel: Recent Labs  Lab 12/06/22 0317 12/06/22 1217 12/06/22 2209 12/07/22 0328 12/08/22 0604 12/08/22 1613 12/09/22 0400 12/10/22 0444 12/11/22 0440 12/12/22 0552  NA 134*   < > 134* 133*   < > 135 135 135 136 134*  K 4.3   < > 4.3 4.3   < > 3.9 3.9 4.3 4.5 4.3  CL 100   < > 98 99   < > 99 99 99 99 98  CO2 25   < > 25 25   < > 25 25 26 25 26   GLUCOSE 118*   < > 195* 107*   < > 167* 132* 136* 101* 92  BUN 51*   < > 63* 59*   < > 70* 68* 57* 68* 46*  CREATININE 2.43*   < > 3.06* 3.21*   < > 3.39* 3.57* 3.19* 3.67* 3.02*  CALCIUM 8.2*   < > 8.5* 8.5*   < > 8.3* 8.4* 8.3* 8.3* 8.4*  MG 2.7*  --  2.6* 2.5*  --   --   --   --   --   --   PHOS 3.9   < >  --  5.1*   < > 5.3* 5.5* 5.2* 6.1* 4.4   < > = values in this interval not displayed.   GFR: Estimated Creatinine Clearance: 8.4 mL/min (A) (by C-G formula based on SCr of 3.02 mg/dL (H)). Liver Function Tests: Recent Labs  Lab 12/08/22 1613 12/09/22 0400 12/10/22 0444 12/11/22 0440 12/12/22 0552  ALBUMIN 3.2* 3.1* 3.1* 3.2* 3.3*   No results for input(s): "LIPASE", "AMYLASE" in the last 168 hours. No results for input(s): "AMMONIA" in the last 168 hours. Coagulation Profile: Recent Labs  Lab 12/07/22 0328  INR 1.2   Cardiac Enzymes: No results for input(s): "CKTOTAL", "CKMB", "CKMBINDEX", "TROPONINI" in the last 168 hours. BNP (last 3 results) No results for input(s): "PROBNP" in the last 8760 hours. HbA1C: No results for input(s): "HGBA1C" in the last 72 hours. CBG: Recent Labs  Lab  12/11/22 0722 12/11/22 1606 12/11/22 2000 12/12/22 0731 12/12/22 1145  GLUCAP 94 159* 167* 95 116*   Lipid Profile: No results for input(s): "CHOL", "HDL", "LDLCALC", "TRIG",  "CHOLHDL", "LDLDIRECT" in the last 72 hours. Thyroid Function Tests: No results for input(s): "TSH", "T4TOTAL", "FREET4", "T3FREE", "THYROIDAB" in the last 72 hours. Anemia Panel: No results for input(s): "VITAMINB12", "FOLATE", "FERRITIN", "TIBC", "IRON", "RETICCTPCT" in the last 72 hours. Most Recent Urinalysis On File:     Component Value Date/Time   COLORURINE YELLOW (A) 02/04/2021 1358   APPEARANCEUR CLEAR (A) 02/04/2021 1358   LABSPEC 1.014 02/04/2021 1358   PHURINE 5.0 02/04/2021 1358   GLUCOSEU NEGATIVE 02/04/2021 1358   HGBUR NEGATIVE 02/04/2021 1358   BILIRUBINUR NEGATIVE 02/04/2021 1358   BILIRUBINUR neg 01/10/2019 1628   KETONESUR NEGATIVE 02/04/2021 1358   PROTEINUR 100 (A) 02/04/2021 1358   UROBILINOGEN 0.2 01/10/2019 1628   NITRITE NEGATIVE 02/04/2021 1358   LEUKOCYTESUR NEGATIVE 02/04/2021 1358   Sepsis Labs: @LABRCNTIP (procalcitonin:4,lacticidven:4) Microbiology: No results found for this or any previous visit (from the past 240 hour(s)).    Radiology Studies last 3 days: No results found.           LOS: 16 days    Time spent: 50 min     Sunnie Nielsen, DO [facility] 12/12/2022, 2:16 PM    Dictation software may have been used to generate the above note. Typos may occur and escape review in typed/dictated notes. Please contact Dr Lyn Hollingshead directly for clarity if needed.  Staff may message me via secure chat in Epic  but this may not receive an immediate response,  please page me for urgent matters!  If 7PM-7AM, please contact night coverage [email or web address]

## 2022-12-12 NOTE — Progress Notes (Signed)
[facility]  ROUNDING NOTE   Subjective:   Patient seen sitting up in bed Alert No family present at bedside Patient appears well, room air with adequate oxygen saturation Appetite appropriate  Dialysis yesterday, tolerated with   Objective:  Vital signs in last 24 hours:  Temp:  [97.4 F (36.3 C)-98.4 F (36.9 C)] 97.9 F (36.6 C) (09/21 0728) Pulse Rate:  [37-64] 53 (09/21 0800) Resp:  [15-35] 27 (09/21 0800) BP: (110-185)/(42-89) 138/58 (09/21 0728) SpO2:  [91 %-100 %] 93 % (09/21 0800) Weight:  [32 kg] 32 kg (09/20 1239)  Weight change:  Filed Weights   12/08/22 1749 12/11/22 0859 12/11/22 1239  Weight: 113.7 kg 30.5 kg 32 kg    Intake/Output: I/O last 3 completed shifts: In: 400 [P.O.:400] Out: 3000 [Urine:1000; Other:2000]   Intake/Output this shift:  No intake/output data recorded.  Physical Exam: General: NAD  Head: Normocephalic, atraumatic. Moist oral mucosal membranes  Eyes: Anicteric  Lungs:  Clear to auscultation  Heart: bradycardia  Abdomen:  Soft, nontender  Extremities:  ++ peripheral edema.  Neurologic: Alert and oriented, moving all four extremities  Skin: No lesions  Access: LIJ temp HD catheter removed on 12/11/2022    Basic Metabolic Panel: Recent Labs  Lab 12/06/22 0317 12/06/22 1217 12/06/22 2209 12/07/22 0328 12/08/22 0604 12/08/22 1613 12/09/22 0400 12/10/22 0444 12/11/22 0440 12/12/22 0552  NA 134*   < > 134* 133*   < > 135 135 135 136 134*  K 4.3   < > 4.3 4.3   < > 3.9 3.9 4.3 4.5 4.3  CL 100   < > 98 99   < > 99 99 99 99 98  CO2 25   < > 25 25   < > 25 25 26 25 26   GLUCOSE 118*   < > 195* 107*   < > 167* 132* 136* 101* 92  BUN 51*   < > 63* 59*   < > 70* 68* 57* 68* 46*  CREATININE 2.43*   < > 3.06* 3.21*   < > 3.39* 3.57* 3.19* 3.67* 3.02*  CALCIUM 8.2*   < > 8.5* 8.5*   < > 8.3* 8.4* 8.3* 8.3* 8.4*  MG 2.7*  --  2.6* 2.5*  --   --   --   --   --   --   PHOS 3.9   < >  --  5.1*   < > 5.3* 5.5* 5.2*  6.1* 4.4   < > = values in this interval not displayed.    Liver Function Tests: Recent Labs  Lab 12/08/22 1613 12/09/22 0400 12/10/22 0444 12/11/22 0440 12/12/22 0552  ALBUMIN 3.2* 3.1* 3.1* 3.2* 3.3*   No results for input(s): "LIPASE", "AMYLASE" in the last 168 hours. No results for input(s): "AMMONIA" in the last 168 hours.  CBC: Recent Labs  Lab 12/08/22 1613 12/09/22 0400 12/10/22 0444 12/11/22 0440 12/12/22 0552  WBC 5.1 6.1 6.2 9.2 8.5  HGB 9.4* 9.5* 9.9* 9.7* 10.1*  HCT 30.0* 29.5* 31.8* 29.9* 31.7*  MCV 93.8 93.4 94.1 92.9 93.2  PLT 153 154 158 162 168    Cardiac Enzymes: No results for input(s): "CKTOTAL", "CKMB", "CKMBINDEX", "TROPONINI" in the last 168 hours.  BNP: Invalid input(s): "POCBNP"  CBG: Recent Labs  Lab 12/10/22 2022 12/11/22 0722 12/11/22 1606 12/11/22 2000 12/12/22 0731  GLUCAP 205* 94 159* 167* 95    Microbiology: Results for orders placed or performed during the hospital encounter of  11/26/22  MRSA Next Gen by PCR, Nasal     Status: None   Collection Time: 11/30/22  4:10 PM   Specimen: Nasal Mucosa; Nasal Swab  Result Value Ref Range Status   MRSA by PCR Next Gen NOT DETECTED NOT DETECTED Final    Comment: (NOTE) The GeneXpert MRSA Assay (FDA approved for NASAL specimens only), is one component of a comprehensive MRSA colonization surveillance program. It is not intended to diagnose MRSA infection nor to guide or monitor treatment for MRSA infections. Test performance is not FDA approved in patients less than 31 years old. Performed at [facility], [address] [place], [place] [address]     Coagulation Studies: No results for input(s): "LABPROT", "INR" in the last 72 hours.   Urinalysis: No results for input(s): "COLORURINE", "LABSPEC", "PHURINE", "GLUCOSEU", "HGBUR", "BILIRUBINUR", "KETONESUR", "PROTEINUR", "UROBILINOGEN", "NITRITE", "LEUKOCYTESUR" in the last 72 hours.  Invalid input(s):  "APPERANCEUR"    Imaging: No results found.   Medications:    sodium chloride     heparin 1,200 Units/hr (12/10/22 2107)    vitamin C  500 mg Oral BID   Chlorhexidine Gluconate Cloth  6 each Topical Daily   cholecalciferol  2,000 Units Oral Daily   docusate sodium  100 mg Oral BID   doxazosin  1 mg Oral BID   epoetin (EPOGEN/PROCRIT) injection  10,000 Units Intravenous Q M,W,F-HD   feeding supplement (NEPRO CARB STEADY)  237 mL Oral TID BM   insulin aspart  0-15 Units Subcutaneous TID AC & HS   insulin aspart  3 Units Subcutaneous TID WC   multivitamin  1 tablet Oral QHS   neomycin-polymyxin b-dexamethasone  1 Application Right Eye TID   omega-3 acid ethyl esters  1 g Oral Daily   rosuvastatin  10 mg Oral Once per day on Monday Wednesday Friday   sodium chloride flush  3 mL Intravenous Q12H   sodium chloride, acetaminophen **OR** acetaminophen, calamine, fentaNYL (SUBLIMAZE) injection, hydrocortisone cream, hydrOXYzine, ipratropium-albuterol, ondansetron **OR** ondansetron (ZOFRAN) IV, oxyCODONE, polyethylene glycol, sodium chloride flush, traZODone  Assessment/ Plan:  [name] is a 82 y.o.  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 Due to progression of kidney disease and outpatient diuretic therapy failure, we feel this patient has progressed to end stage renal disease.  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 CRRT from 9/12 to 9/13 -Dialysis received yesterday, UF 2 L achieved. - Appreciate nursing removing HD temp cath to allow for line free holiday this weekend. - Vascular aware of need of PermCath  placement early next week. -Patient will receive IV furosemide 120 mg today and tomorrow. - Renal navigator aware of patient and and has begun outpatient clinic placement  Acute exacerbation of chronic diastolic congestive heart failure: complicated with acute respiratory failure and pulmonary hypertension. Currently with junctional rhythm. - Room air -Will continue fluid management with dialysis and diuresis.  Diabetes mellitus type II with chronic kidney disease - holding empagliflozin -Glucose well-controlled.   LOS: 16 Stefon Ramthun 9/21/202411:45 AM

## 2022-12-13 DIAGNOSIS — I5033 Acute on chronic diastolic (congestive) heart failure: Secondary | ICD-10-CM | POA: Diagnosis not present

## 2022-12-13 LAB — CBC
HCT: 33.4 % — ABNORMAL LOW (ref 39.0–52.0)
Hemoglobin: 10.3 g/dL — ABNORMAL LOW (ref 13.0–17.0)
MCH: 29.7 pg (ref 26.0–34.0)
MCHC: 30.8 g/dL (ref 30.0–36.0)
MCV: 96.3 fL (ref 80.0–100.0)
Platelets: 174 10*3/uL (ref 150–400)
RBC: 3.47 MIL/uL — ABNORMAL LOW (ref 4.22–5.81)
RDW: 14.9 % (ref 11.5–15.5)
WBC: 8.4 10*3/uL (ref 4.0–10.5)
nRBC: 0 % (ref 0.0–0.2)

## 2022-12-13 LAB — RENAL FUNCTION PANEL
Albumin: 3.3 g/dL — ABNORMAL LOW (ref 3.5–5.0)
Anion gap: 10 (ref 5–15)
BUN: 57 mg/dL — ABNORMAL HIGH (ref 8–23)
CO2: 26 mmol/L (ref 22–32)
Calcium: 8.6 mg/dL — ABNORMAL LOW (ref 8.9–10.3)
Chloride: 101 mmol/L (ref 98–111)
Creatinine, Ser: 3.66 mg/dL — ABNORMAL HIGH (ref 0.61–1.24)
GFR, Estimated: 16 mL/min — ABNORMAL LOW (ref >60)
Glucose, Bld: 99 mg/dL (ref 70–99)
Phosphorus: 5.8 mg/dL — ABNORMAL HIGH (ref 2.5–4.6)
Potassium: 4.6 mmol/L (ref 3.5–5.1)
Sodium: 137 mmol/L (ref 135–145)

## 2022-12-13 LAB — GLUCOSE, CAPILLARY
Glucose-Capillary: 91 mg/dL (ref 70–99)
Glucose-Capillary: 150 mg/dL — ABNORMAL HIGH (ref 70–99)
Glucose-Capillary: 159 mg/dL — ABNORMAL HIGH (ref 70–99)
Glucose-Capillary: 220 mg/dL — ABNORMAL HIGH (ref 70–99)

## 2022-12-13 LAB — HEPARIN LEVEL (UNFRACTIONATED)
Heparin Unfractionated: 0.24 IU/mL — ABNORMAL LOW (ref 0.30–0.70)
Heparin Unfractionated: 0.38 IU/mL (ref 0.30–0.70)

## 2022-12-13 MED ORDER — FUROSEMIDE 10 MG/ML IJ SOLN
120.0000 mg | Freq: Once | INTRAVENOUS | Status: AC
  Administered 2022-12-13: 120 mg via INTRAVENOUS
  Filled 2022-12-13: qty 12

## 2022-12-13 MED ORDER — DIPHENHYDRAMINE-ZINC ACETATE 2-0.1 % EX CREA
TOPICAL_CREAM | Freq: Two times a day (BID) | CUTANEOUS | Status: DC | PRN
  Filled 2022-12-13: qty 28
  Filled 2022-12-13: qty 1

## 2022-12-13 MED ORDER — HEPARIN BOLUS VIA INFUSION
1400.0000 [IU] | Freq: Once | INTRAVENOUS | Status: AC
  Administered 2022-12-13: 1400 [IU] via INTRAVENOUS
  Filled 2022-12-13: qty 1400

## 2022-12-13 NOTE — Progress Notes (Signed)
PROGRESS NOTE    Kyle Roberson   ZOX:[id number] DOB: 1940/05/29  DOA: 11/26/2022 Date of Service: 12/13/22 which is hospital day 17  PCP: [name], MD   HPI:  [phone] with h/o CAD, stage 5 CKD, chronic diastolic CHF, BLE lymphedema, afib on Eliquis, PAH, and SDH presented on 09/05 with weight gain and SOB.    Hospital course / major events:  He was diagnosed with acute on chronic CHF and started on Lasix drip.  Cardiology was consulted. Echo this admission with EF 55-60%, mild LVH, moderate RV enlargement with mildly decreased RV systolic function, PASP 73 mmHg, severe LAE, mild-moderate TR, IVC dilated.  IV midodrine drip 09/09.   RHC done on 09/11 with elevated R > L filling pressures, mostly right-sided, worsening renal function and decreased UOP so Lasix stopped.  Started on CRRT 09/12 for additional volume removal and completed this. 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  He is on IV Lasix.  Remains on heparin gtt for Afib, has been bradycardic w/ ventricular rates 40-50s but go into 60-70 range on standing. Awaiting permcath placement and outpatient HD plan   Consultants:  Cardiology - advanced HF team  Nephrology  PCCM Palliative care Surgery PT/OT [facility] team  Procedures: Echocardiogram 9/6 CVC 9/12 RHC on 9/11 CRRT HD 9/17, 9/18, 09/20       ASSESSMENT & PLAN:   Acute on chronic diastolic congestive heart failure Pulmonary HTN, severe - group 2 PH (from elevated LVEDP  Presented with volume overload with increasing weight up to 255 pounds. 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 Bradycardic currently Apixaban 2.5 bid on hold. Continue heparin for now per cardiology  No role for PPM currently especially in light of need for HD and additional nidus for blood stream infection   Anemia of chronic inflammation, cardiorenal disease ESRD Starting Epo  Hyperphosphatemia Binders per nephro  L ankle pain Fell in the ER Xray ordered and negative CAM walker for use with ambulation   Hypertension Continue doxazosin, hydralazine   Hyperlipidemia Continue rosuvastatin, Lovaza   CAD with remote PCI Continue Imdur, statin No ASA No current c/o CP   History of paroxysmal a fib Bradycardia currently so not on rate controlling medications On heparin drip for now, plan resume Eliquis per cardiology    Goals of care Patient appears disinterested in aggressive care but his wife is insistent She has declined palliative care consultation, "he's not there yet" He has a very poor overall prognosis based on cardiorenal failure and is not a good  long-term HD candidate Ongoing discussions are encouraged regarding code status as well as possibly EOL care    Morbid obesity Complicates overall prognosis Body mass index is 36.5 kg/m.Marland Kitchen  Nutrition Problem: Increased nutrient needs Etiology: acute illness (CRRT) Signs/Symptoms: estimated needs   Pressure ulcer  Pressure Injury 12/03/22 Buttocks  Right;Left Stage 1 -  Intact skin with non-blanchable redness of a localized area usually over a bony prominence. Purple/blue (Active)  12/03/22 0820  Location: Buttocks  Location Orientation: Right;Left  Staging: Stage 1 -  Intact skin with non-blanchable redness of a localized area usually over a bony prominence.  Wound Description (Comments): Purple/blue  Present on Admission:           Morbid obesity based on BMI: Body mass index is 10.73 kg/m.  And serious comorbid medical conditions   DVT prophylaxis: on heparin gtt IV fluids: no continuous IV fluids  Nutrition: low sodium Central lines / invasive devices:  [facility] HD cath   Code Status: FULL CODE ACP documentation reviewed: 12/12/22 none on file in [facility]  TOC needs: TBD. Renal navigator working on HD outpatient  Barriers to dispo / significant pending items: permcath placement early next week             Subjective / Brief ROS:  Patient reports no concerns today other than feeling tired Denies CP/SOB (apparent slight increased WOB but he states he feels fine no breathing trouble)  Pain controlled.  Denies new weakness.  Tolerating diet.  Reports no concerns w/ urination/defecation.   Family Communication: wife is present at bedside on rounds     Objective Findings:  Vitals:   12/13/22 0144 12/13/22 0206 12/13/22 0348 12/13/22 0841  BP: (!) 142/51   (!) 153/70  Pulse: (!) 48 (!) 47    Resp: (!) 29 (!) 25    Temp:   97.7 F (36.5 C) 97.7 F (36.5 C)  TempSrc:   Oral Oral  SpO2: 97% 98%    Weight:      Height:       No intake or output data in the 24 hours ending 12/13/22 1052  Filed Weights   12/08/22 1749 12/11/22 0859 12/11/22 1239  Weight: 113.7 kg 30.5 kg 32 kg    Examination:  Physical Exam Constitutional:      General: He is not in acute distress.    Appearance: He is ill-appearing.  Cardiovascular:     Rate and Rhythm: Normal rate.     Heart sounds: Murmur heard.  Pulmonary:      Effort: Pulmonary effort is normal. No respiratory distress.     Breath sounds: Examination of the right-lower field reveals decreased breath sounds. Examination of the left-lower field reveals decreased breath sounds. Decreased breath sounds present.     Comments: Effort appears easier compared to yesterday  Abdominal:     Palpations: Abdomen is soft.  Skin:    General: Skin is warm and dry.  Neurological:     General: No focal deficit present.     Mental Status: He is alert and oriented to person, place, and time. Mental status is at baseline.  Psychiatric:        Mood and Affect: Mood normal.        Behavior: Behavior normal.          Scheduled Medications:   vitamin C  500 mg Oral BID   Chlorhexidine Gluconate Cloth  6 each Topical Daily   cholecalciferol  2,000 Units Oral Daily  docusate sodium  100 mg Oral BID   doxazosin  1 mg Oral BID   epoetin (EPOGEN/PROCRIT) injection  10,000 Units Intravenous Q M,W,F-HD   feeding supplement (NEPRO CARB STEADY)  237 mL Oral TID BM   insulin aspart  0-15 Units Subcutaneous TID AC & HS   insulin aspart  3 Units Subcutaneous TID WC   multivitamin  1 tablet Oral QHS   neomycin-polymyxin b-dexamethasone  1 Application Right Eye TID   omega-3 acid ethyl esters  1 g Oral Daily   rosuvastatin  10 mg Oral Once per day on Monday Wednesday Friday   sodium chloride flush  3 mL Intravenous Q12H    Continuous Infusions:  sodium chloride     furosemide     heparin 1,400 Units/hr (12/13/22 0805)    PRN Medications:  sodium chloride, acetaminophen **OR** acetaminophen, calamine, diphenhydrAMINE-zinc acetate, fentaNYL (SUBLIMAZE) injection, hydrocortisone cream, hydrOXYzine, ipratropium-albuterol, ondansetron **OR** ondansetron (ZOFRAN) IV, oxyCODONE, polyethylene glycol, sodium chloride flush, traZODone  Antimicrobials from admission:  Anti-infectives (From admission, onward)    None           Data Reviewed:  I have personally  reviewed the following...  CBC: Recent Labs  Lab 12/09/22 0400 12/10/22 0444 12/11/22 0440 12/12/22 0552 12/13/22 0639  WBC 6.1 6.2 9.2 8.5 8.4  HGB 9.5* 9.9* 9.7* 10.1* 10.3*  HCT 29.5* 31.8* 29.9* 31.7* 33.4*  MCV 93.4 94.1 92.9 93.2 96.3  PLT 154 158 162 168 174   Basic Metabolic Panel: Recent Labs  Lab 12/06/22 2209 12/07/22 0328 12/08/22 0604 12/09/22 0400 12/10/22 0444 12/11/22 0440 12/12/22 0552 12/13/22 0639  NA 134* 133*   < > 135 135 136 134* 137  K 4.3 4.3   < > 3.9 4.3 4.5 4.3 4.6  CL 98 99   < > 99 99 99 98 101  CO2 25 25   < > 25 26 25 26 26   GLUCOSE 195* 107*   < > 132* 136* 101* 92 99  BUN 63* 59*   < > 68* 57* 68* 46* 57*  CREATININE 3.06* 3.21*   < > 3.57* 3.19* 3.67* 3.02* 3.66*  CALCIUM 8.5* 8.5*   < > 8.4* 8.3* 8.3* 8.4* 8.6*  MG 2.6* 2.5*  --   --   --   --   --   --   PHOS  --  5.1*   < > 5.5* 5.2* 6.1* 4.4 5.8*   < > = values in this interval not displayed.   GFR: Estimated Creatinine Clearance: 6.9 mL/min (A) (by C-G formula based on SCr of 3.66 mg/dL (H)). Liver Function Tests: Recent Labs  Lab 12/09/22 0400 12/10/22 0444 12/11/22 0440 12/12/22 0552 12/13/22 0639  ALBUMIN 3.1* 3.1* 3.2* 3.3* 3.3*   No results for input(s): "LIPASE", "AMYLASE" in the last 168 hours. No results for input(s): "AMMONIA" in the last 168 hours. Coagulation Profile: Recent Labs  Lab 12/07/22 0328  INR 1.2   Cardiac Enzymes: No results for input(s): "CKTOTAL", "CKMB", "CKMBINDEX", "TROPONINI" in the last 168 hours. BNP (last 3 results) No results for input(s): "PROBNP" in the last 8760 hours. HbA1C: No results for input(s): "HGBA1C" in the last 72 hours. CBG: Recent Labs  Lab 12/11/22 2000 12/12/22 0731 12/12/22 1145 12/12/22 2114 12/13/22 0840  GLUCAP 167* 95 116* 155* 91   Lipid Profile: No results for input(s): "CHOL", "HDL", "LDLCALC", "TRIG", "CHOLHDL", "LDLDIRECT" in the last 72 hours. Thyroid Function Tests: No results for  input(s): "TSH", "T4TOTAL", "  FREET4", "T3FREE", "THYROIDAB" in the last 72 hours. Anemia Panel: No results for input(s): "VITAMINB12", "FOLATE", "FERRITIN", "TIBC", "IRON", "RETICCTPCT" in the last 72 hours. Most Recent Urinalysis On File:     Component Value Date/Time   COLORURINE YELLOW (A) 02/04/2021 1358   APPEARANCEUR CLEAR (A) 02/04/2021 1358   LABSPEC 1.014 02/04/2021 1358   PHURINE 5.0 02/04/2021 1358   GLUCOSEU NEGATIVE 02/04/2021 1358   HGBUR NEGATIVE 02/04/2021 1358   BILIRUBINUR NEGATIVE 02/04/2021 1358   BILIRUBINUR neg 01/10/2019 1628   KETONESUR NEGATIVE 02/04/2021 1358   PROTEINUR 100 (A) 02/04/2021 1358   UROBILINOGEN 0.2 01/10/2019 1628   NITRITE NEGATIVE 02/04/2021 1358   LEUKOCYTESUR NEGATIVE 02/04/2021 1358   Sepsis Labs: @LABRCNTIP (procalcitonin:4,lacticidven:4) Microbiology: No results found for this or any previous visit (from the past 240 hour(s)).    Radiology Studies last 3 days: No results found.           LOS: 17 days    Time spent: 50 min     Sunnie Nielsen, DO [facility] 12/13/2022, 10:52 AM    Dictation software may have been used to generate the above note. Typos may occur and escape review in typed/dictated notes. Please contact Dr Lyn Hollingshead directly for clarity if needed.  Staff may message me via secure chat in Epic  but this may not receive an immediate response,  please page me for urgent matters!  If 7PM-7AM, please contact night coverage [email or web address]

## 2022-12-13 NOTE — Consult Note (Signed)
ANTICOAGULATION CONSULT NOTE  Pharmacy Consult for IV Heparin Indication: atrial fibrillation  Patient Measurements: Height: 5\' 8"  (172.7 cm) Weight: 32 kg (70 lb 8.8 oz) IBW/kg (Calculated) : 68.4 Heparin Dosing Weight: 93 kg  Labs: Recent Labs    12/11/22 0440 12/11/22 1230 12/12/22 0552 12/13/22 0639 12/13/22 1552  HGB 9.7*  --  10.1* 10.3*  --   HCT 29.9*  --  31.7* 33.4*  --   PLT 162  --  168 174  --   HEPARINUNFRC  --    < > 0.30 0.24* 0.38  CREATININE 3.67*  --  3.02* 3.66*  --    < > = values in this interval not displayed.    Estimated Creatinine Clearance: 6.9 mL/min (A) (by C-G formula based on SCr of 3.66 mg/dL (H)).   Medical History: Past Medical History:  Diagnosis Date   AKI (acute kidney injury) (HCC) 07/06/2018   CAD (coronary artery disease)    a. Remote PCI/stenting to LAD w PTCA Diagnoal. RCA 60%; b.  2005/2008 Cardiolites w/ reportedly mild ischemia in Diag territory-->Med rx.   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 04/2019 Echo: EF 55-60%, no rwma, nl RV fxn, RVSP 55.18mmHg. Mod dil LA. Mild-mod MR. Mod dil PA.   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 04/2019 Echo: RVSP 55.87mmHg.   Permanent atrial fibrillation (HCC)    a. CHA2DS2VASc = 5-->dose adjusted eliquis (age/creat).   Subdural hematoma (HCC)    a. 01/2021 in setting of fall s/p L frontotemporal craniotomy.    Medications:  Apixaban 2.5 mg BID (last dose 9/12 at 1100)  Assessment: 82 y/o M with PMH as above admitted with acute on chronic diastolic CHF / RV failure complicated by CKD / cardiorenal syndrome. Patient is now being started on CRRT. Pharmacy consulted to transition apixaban to  heparin for now in case further procedures required.  9/18 0400 0.41 9/19 0444 0.38 9/20 1230 0.3 9/21 0552 0.3 9/22 0639 0.24, subtherapeutic  9/22 1552 0.38, therapeutic x 1  Goal of Therapy:  Heparin level 0.3-0.7 units/ml Monitor platelets by anticoagulation protocol: Yes    Plan:  - HL therapeutic x 1 - Continue heparin infusion at 1400 units/hr.  - Check heparin level in 8 hours to confirm - CBC daily.   Barrie Folk, PharmD Clinical Pharmacist 12/13/2022 4:21 PM

## 2022-12-13 NOTE — Plan of Care (Signed)

## 2022-12-13 NOTE — Consult Note (Signed)
ANTICOAGULATION CONSULT NOTE  Pharmacy Consult for IV Heparin Indication: atrial fibrillation  Patient Measurements: Height: 5\' 8"  (172.7 cm) Weight: 32 kg (70 lb 8.8 oz) IBW/kg (Calculated) : 68.4 Heparin Dosing Weight: 93 kg  Labs: Recent Labs    12/11/22 0440 12/11/22 1230 12/12/22 0552 12/13/22 0639  HGB 9.7*  --  10.1* 10.3*  HCT 29.9*  --  31.7* 33.4*  PLT 162  --  168 174  HEPARINUNFRC  --  0.30 0.30 0.24*  CREATININE 3.67*  --  3.02* 3.66*    Estimated Creatinine Clearance: 6.9 mL/min (A) (by C-G formula based on SCr of 3.66 mg/dL (H)).   Medical History: Past Medical History:  Diagnosis Date   AKI (acute kidney injury) (HCC) 07/06/2018   CAD (coronary artery disease)    a. Remote PCI/stenting to LAD w PTCA Diagnoal. RCA 60%; b.  2005/2008 Cardiolites w/ reportedly mild ischemia in Diag territory-->Med rx.   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 04/2019 Echo: EF 55-60%, no rwma, nl RV fxn, RVSP 55.37mmHg. Mod dil LA. Mild-mod MR. Mod dil PA.   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 04/2019 Echo: RVSP 55.37mmHg.   Permanent atrial fibrillation (HCC)    a. CHA2DS2VASc = 5-->dose adjusted eliquis (age/creat).   Subdural hematoma (HCC)    a. 01/2021 in setting of fall s/p L frontotemporal craniotomy.    Medications:  Apixaban 2.5 mg BID (last dose 9/12 at 1100)  Assessment: 82 y/o M with PMH as above admitted with acute on chronic diastolic CHF / RV failure complicated by CKD / cardiorenal syndrome. Patient is now being started on CRRT. Pharmacy consulted to transition apixaban to heparin for now in case further procedures required.  9/18 0400 0.41 9/19 0444 0.38 9/20  1230 0.3 9/21 0552 0.3 9/22 0639 0.24, subtherapeutic    Goal of Therapy:  Heparin level 0.3-0.7 units/ml Monitor platelets by anticoagulation protocol: Yes    Plan:  - Bolus 1400 units x 1  - Will increase heparin infusion to 1400 units/hr.  - Check heparin level in 8 hours after rate change. - CBC daily.   Bettey Costa, PharmD Clinical Pharmacist 12/13/2022 7:27 AM

## 2022-12-13 NOTE — Progress Notes (Signed)
[facility]  ROUNDING NOTE   Subjective:   Patient seen sitting up in bed, wife at bedside Patient received sleep aid overnight, remains drowsy but able to follow conversation and participate. Appetite appropriate, denies nausea or vomiting Remains on room air  Appropriately answered wife's questions to the best of our ability.   Objective:  Vital signs in last 24 hours:  Temp:  [97.6 F (36.4 C)-97.8 F (36.6 C)] 97.6 F (36.4 C) (09/22 1146) Pulse Rate:  [39-54] 54 (09/22 1146) Resp:  [16-29] 16 (09/22 1146) BP: (125-170)/(50-76) 150/58 (09/22 1146) SpO2:  [87 %-100 %] 97 % (09/22 1146) FiO2 (%):  [36 %] 36 % (09/22 0206)  Weight change:  Filed Weights   12/08/22 1749 12/11/22 0859 12/11/22 1239  Weight: 113.7 kg 30.5 kg 32 kg    Intake/Output: I/O last 3 completed shifts: In: -  Out: 200 [Urine:200]   Intake/Output this shift:  Total I/O In: 240 [P.O.:240] Out: -   Physical Exam: General: NAD  Head: Normocephalic, atraumatic. Moist oral mucosal membranes  Eyes: Anicteric  Lungs:  Clear to auscultation  Heart: bradycardia  Abdomen:  Soft, nontender  Extremities:  ++ peripheral edema.  Neurologic: Alert and oriented, moving all four extremities  Skin: No lesions  Access: LIJ temp HD catheter removed on 12/11/2022    Basic Metabolic Panel: Recent Labs  Lab 12/06/22 2209 12/07/22 0328 12/08/22 0604 12/09/22 0400 12/10/22 0444 12/11/22 0440 12/12/22 0552 12/13/22 0639  NA 134* 133*   < > 135 135 136 134* 137  K 4.3 4.3   < > 3.9 4.3 4.5 4.3 4.6  CL 98 99   < > 99 99 99 98 101  CO2 25 25   < > 25 26 25 26 26   GLUCOSE 195* 107*   < > 132* 136* 101* 92 99  BUN 63* 59*   < > 68* 57* 68* 46* 57*  CREATININE 3.06* 3.21*   < > 3.57* 3.19* 3.67* 3.02* 3.66*  CALCIUM 8.5* 8.5*   < > 8.4* 8.3* 8.3* 8.4* 8.6*  MG 2.6* 2.5*  --   --   --   --   --   --   PHOS  --  5.1*   < > 5.5* 5.2* 6.1* 4.4 5.8*   < > = values in this interval not  displayed.    Liver Function Tests: Recent Labs  Lab 12/09/22 0400 12/10/22 0444 12/11/22 0440 12/12/22 0552 12/13/22 0639  ALBUMIN 3.1* 3.1* 3.2* 3.3* 3.3*   No results for input(s): "LIPASE", "AMYLASE" in the last 168 hours. No results for input(s): "AMMONIA" in the last 168 hours.  CBC: Recent Labs  Lab 12/09/22 0400 12/10/22 0444 12/11/22 0440 12/12/22 0552 12/13/22 0639  WBC 6.1 6.2 9.2 8.5 8.4  HGB 9.5* 9.9* 9.7* 10.1* 10.3*  HCT 29.5* 31.8* 29.9* 31.7* 33.4*  MCV 93.4 94.1 92.9 93.2 96.3  PLT 154 158 162 168 174    Cardiac Enzymes: No results for input(s): "CKTOTAL", "CKMB", "CKMBINDEX", "TROPONINI" in the last 168 hours.  BNP: Invalid input(s): "POCBNP"  CBG: Recent Labs  Lab 12/12/22 0731 12/12/22 1145 12/12/22 2114 12/13/22 0840 12/13/22 1149  GLUCAP 95 116* 155* 91 150*    Microbiology: Results for orders placed or performed during the hospital encounter of 11/26/22  MRSA Next Gen by PCR, Nasal     Status: None   Collection Time: 11/30/22  4:10 PM   Specimen: Nasal Mucosa; Nasal Swab  Result Value Ref  Range Status   MRSA by PCR Next Gen NOT DETECTED NOT DETECTED Final    Comment: (NOTE) The GeneXpert MRSA Assay (FDA approved for NASAL specimens only), is one component of a comprehensive MRSA colonization surveillance program. It is not intended to diagnose MRSA infection nor to guide or monitor treatment for MRSA infections. Test performance is not FDA approved in patients less than 73 years old. Performed at [facility], [address] [place], [place] [address]     Coagulation Studies: No results for input(s): "LABPROT", "INR" in the last 72 hours.   Urinalysis: No results for input(s): "COLORURINE", "LABSPEC", "PHURINE", "GLUCOSEU", "HGBUR", "BILIRUBINUR", "KETONESUR", "PROTEINUR", "UROBILINOGEN", "NITRITE", "LEUKOCYTESUR" in the last 72 hours.  Invalid input(s): "APPERANCEUR"    Imaging: No results  found.   Medications:    sodium chloride     furosemide     heparin 1,400 Units/hr (12/13/22 0805)    vitamin C  500 mg Oral BID   Chlorhexidine Gluconate Cloth  6 each Topical Daily   cholecalciferol  2,000 Units Oral Daily   docusate sodium  100 mg Oral BID   doxazosin  1 mg Oral BID   epoetin (EPOGEN/PROCRIT) injection  10,000 Units Intravenous Q M,W,F-HD   feeding supplement (NEPRO CARB STEADY)  237 mL Oral TID BM   insulin aspart  0-15 Units Subcutaneous TID AC & HS   insulin aspart  3 Units Subcutaneous TID WC   multivitamin  1 tablet Oral QHS   neomycin-polymyxin b-dexamethasone  1 Application Right Eye TID   omega-3 acid ethyl esters  1 g Oral Daily   rosuvastatin  10 mg Oral Once per day on Monday Wednesday Friday   sodium chloride flush  3 mL Intravenous Q12H   sodium chloride, acetaminophen **OR** acetaminophen, calamine, diphenhydrAMINE-zinc acetate, fentaNYL (SUBLIMAZE) injection, hydrocortisone cream, hydrOXYzine, ipratropium-albuterol, ondansetron **OR** ondansetron (ZOFRAN) IV, oxyCODONE, polyethylene glycol, sodium chloride flush, traZODone  Assessment/ Plan:  [name] is a 82 y.o.  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 Due to progression of kidney disease and outpatient diuretic therapy failure, we feel this patient has progressed to end stage renal disease.  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 CRRT from 9/12 to 9/13 -Last treatment received on Friday.  HD temp cath removed after treatment. - Vascular surgery will place HD PermCath Monday  - Next treatment scheduled for Monday  after PermCath placement. Patient will receive IV furosemide 120 mg today - Renal navigator aware of patient and and has begun outpatient clinic placement  Acute exacerbation of chronic diastolic congestive heart failure: complicated with acute respiratory failure and pulmonary hypertension. Currently with junctional rhythm. - Room air -Will continue fluid management with dialysis and diuresis.  Diabetes mellitus type II with chronic kidney disease - holding empagliflozin -Glucose well-controlled.   LOS: 17 Kyle Roberson 9/22/202411:55 AM

## 2022-12-14 ENCOUNTER — Encounter
Admission: EM | Disposition: A | Discharge status: Skilled Nursing Facility | Payer: Self-pay | Source: Home / Self Care | Attending: Internal Medicine

## 2022-12-14 DIAGNOSIS — Z992 Dependence on renal dialysis: Secondary | ICD-10-CM | POA: Diagnosis not present

## 2022-12-14 DIAGNOSIS — N186 End stage renal disease: Secondary | ICD-10-CM | POA: Diagnosis not present

## 2022-12-14 DIAGNOSIS — I5033 Acute on chronic diastolic (congestive) heart failure: Secondary | ICD-10-CM | POA: Diagnosis not present

## 2022-12-14 HISTORY — PX: DIALYSIS/PERMA CATHETER INSERTION: CATH118288

## 2022-12-14 LAB — HEPARIN LEVEL (UNFRACTIONATED)
Heparin Unfractionated: 0.39 IU/mL (ref 0.30–0.70)
Heparin Unfractionated: 0.36 IU/mL (ref 0.30–0.70)

## 2022-12-14 LAB — GLUCOSE, CAPILLARY
Glucose-Capillary: 104 mg/dL — ABNORMAL HIGH (ref 70–99)
Glucose-Capillary: 117 mg/dL — ABNORMAL HIGH (ref 70–99)
Glucose-Capillary: 129 mg/dL — ABNORMAL HIGH (ref 70–99)
Glucose-Capillary: 170 mg/dL — ABNORMAL HIGH (ref 70–99)
Glucose-Capillary: 189 mg/dL — ABNORMAL HIGH (ref 70–99)

## 2022-12-14 LAB — RENAL FUNCTION PANEL
Albumin: 3.3 g/dL — ABNORMAL LOW (ref 3.5–5.0)
Anion gap: 12 (ref 5–15)
BUN: 67 mg/dL — ABNORMAL HIGH (ref 8–23)
CO2: 25 mmol/L (ref 22–32)
Calcium: 8.5 mg/dL — ABNORMAL LOW (ref 8.9–10.3)
Chloride: 101 mmol/L (ref 98–111)
Creatinine, Ser: 4.39 mg/dL — ABNORMAL HIGH (ref 0.61–1.24)
GFR, Estimated: 13 mL/min — ABNORMAL LOW (ref >60)
Glucose, Bld: 132 mg/dL — ABNORMAL HIGH (ref 70–99)
Phosphorus: 6.6 mg/dL — ABNORMAL HIGH (ref 2.5–4.6)
Potassium: 4.5 mmol/L (ref 3.5–5.1)
Sodium: 138 mmol/L (ref 135–145)

## 2022-12-14 LAB — CBC
HCT: 31 % — ABNORMAL LOW (ref 39.0–52.0)
Hemoglobin: 9.9 g/dL — ABNORMAL LOW (ref 13.0–17.0)
MCH: 30.5 pg (ref 26.0–34.0)
MCHC: 31.9 g/dL (ref 30.0–36.0)
MCV: 95.4 fL (ref 80.0–100.0)
Platelets: 170 10*3/uL (ref 150–400)
RBC: 3.25 MIL/uL — ABNORMAL LOW (ref 4.22–5.81)
RDW: 15 % (ref 11.5–15.5)
WBC: 8.1 10*3/uL (ref 4.0–10.5)
nRBC: 0 % (ref 0.0–0.2)

## 2022-12-14 SURGERY — DIALYSIS/PERMA CATHETER INSERTION
Anesthesia: Moderate Sedation

## 2022-12-14 MED ORDER — ONDANSETRON HCL 4 MG/2ML IJ SOLN: 4.0000 mg | Freq: Four times a day (QID) | INTRAMUSCULAR | Status: DC | PRN

## 2022-12-14 MED ORDER — MIDAZOLAM HCL 2 MG/2ML IJ SOLN
INTRAMUSCULAR | Status: DC | PRN
  Administered 2022-12-14: 1 mg via INTRAVENOUS

## 2022-12-14 MED ORDER — DIPHENHYDRAMINE HCL 50 MG/ML IJ SOLN: 50.0000 mg | Freq: Once | INTRAMUSCULAR | Status: DC | PRN

## 2022-12-14 MED ORDER — HEPARIN (PORCINE) 25000 UT/250ML-% IV SOLN
1400.0000 [IU]/h | INTRAVENOUS | Status: DC
  Administered 2022-12-15: 1400 [IU]/h via INTRAVENOUS
  Filled 2022-12-14: qty 250

## 2022-12-14 MED ORDER — CEFAZOLIN SODIUM-DEXTROSE 1-4 GM/50ML-% IV SOLN
1.0000 g | INTRAVENOUS | Status: DC
  Filled 2022-12-14: qty 50

## 2022-12-14 MED ORDER — FENTANYL CITRATE (PF) 100 MCG/2ML IJ SOLN
INTRAMUSCULAR | Status: DC | PRN
  Administered 2022-12-14: 25 ug via INTRAVENOUS

## 2022-12-14 MED ORDER — METHYLPREDNISOLONE SODIUM SUCC 125 MG IJ SOLR: 125.0000 mg | Freq: Once | INTRAMUSCULAR | Status: DC | PRN

## 2022-12-14 MED ORDER — MIDAZOLAM HCL 2 MG/2ML IJ SOLN
INTRAMUSCULAR | Status: AC
  Filled 2022-12-14: qty 2

## 2022-12-14 MED ORDER — HEPARIN (PORCINE) IN NACL 1000-0.9 UT/500ML-% IV SOLN
INTRAVENOUS | Status: DC | PRN
  Administered 2022-12-14: 500 mL

## 2022-12-14 MED ORDER — CEFAZOLIN SODIUM-DEXTROSE 1-4 GM/50ML-% IV SOLN
INTRAVENOUS | Status: AC
  Filled 2022-12-14: qty 50

## 2022-12-14 MED ORDER — MIDAZOLAM HCL 2 MG/ML PO SYRP
8.0000 mg | ORAL_SOLUTION | Freq: Once | ORAL | Status: DC | PRN
  Filled 2022-12-14: qty 5

## 2022-12-14 MED ORDER — LIDOCAINE-EPINEPHRINE (PF) 1 %-1:200000 IJ SOLN
INTRAMUSCULAR | Status: DC | PRN
  Administered 2022-12-14: 20 mL via INTRADERMAL

## 2022-12-14 MED ORDER — FAMOTIDINE 20 MG PO TABS: 40.0000 mg | ORAL_TABLET | Freq: Once | ORAL | Status: DC | PRN

## 2022-12-14 MED ORDER — FENTANYL CITRATE PF 50 MCG/ML IJ SOSY: 12.5000 ug | PREFILLED_SYRINGE | Freq: Once | INTRAMUSCULAR | Status: DC | PRN

## 2022-12-14 MED ORDER — HEPARIN SODIUM (PORCINE) 1000 UNIT/ML IJ SOLN
INTRAMUSCULAR | Status: DC | PRN
  Administered 2022-12-14: 5000 [IU] via INTRAVENOUS

## 2022-12-14 MED ORDER — FENTANYL CITRATE (PF) 100 MCG/2ML IJ SOLN
INTRAMUSCULAR | Status: AC
  Filled 2022-12-14: qty 2

## 2022-12-14 MED ORDER — HYDROMORPHONE HCL 1 MG/ML IJ SOLN: 1.0000 mg | Freq: Once | INTRAMUSCULAR | Status: DC | PRN

## 2022-12-14 MED ORDER — IODIXANOL 320 MG/ML IV SOLN
INTRAVENOUS | Status: DC | PRN
  Administered 2022-12-14: 5 mL via INTRAVENOUS

## 2022-12-14 MED ORDER — FUROSEMIDE 10 MG/ML IJ SOLN
40.0000 mg | Freq: Once | INTRAMUSCULAR | Status: AC
  Administered 2022-12-14: 40 mg via INTRAVENOUS
  Filled 2022-12-14: qty 4

## 2022-12-14 MED ORDER — SODIUM CHLORIDE 0.9 % IV SOLN: INTRAVENOUS | Status: DC

## 2022-12-14 SURGICAL SUPPLY — 12 items
ADH SKN CLS APL DERMABOND .7 (GAUZE/BANDAGES/DRESSINGS) ×1
BIOPATCH RED 1 DISK 7.0 (GAUZE/BANDAGES/DRESSINGS) IMPLANT
CANNULA 5F STIFF (CANNULA) IMPLANT
CATH CANNON HEMO 15FR 19 (HEMODIALYSIS SUPPLIES) IMPLANT
COVER PROBE ULTRASOUND 5X96 (MISCELLANEOUS) IMPLANT
DERMABOND ADVANCED .7 DNX12 (GAUZE/BANDAGES/DRESSINGS) IMPLANT
GAUZE SPONGE 4X4 12PLY STRL (GAUZE/BANDAGES/DRESSINGS) IMPLANT
GUIDEWIRE AMPLATZ SHORT (WIRE) IMPLANT
PACK ANGIOGRAPHY (CUSTOM PROCEDURE TRAY) IMPLANT
SUT MNCRL AB 4-0 PS2 18 (SUTURE) IMPLANT
SUT PROLENE 0 CT 1 30 (SUTURE) IMPLANT
WIRE NITINOL .018 (WIRE) IMPLANT

## 2022-12-14 NOTE — Progress Notes (Signed)
Patient ID: Kyle Roberson, male   DOB: 22-Sep-1940, 82 y.o.   MRN: [id number]     Advanced Heart Failure Rounding Note  PCP-Cardiologist: Julien Nordmann, MD   Subjective:    - Tolerating HD well. Spoke with wife in room.  - Euvolemic on exam today. JVP 7cm.    Objective:   Weight Range: 32 kg Body mass index is 10.73 kg/m.   Vital Signs:   Temp:  [97.4 F (36.3 C)-98.3 F (36.8 C)] 97.7 F (36.5 C) (09/23 1454) Pulse Rate:  [0-71] 60 (09/23 1630) Resp:  [15-35] 18 (09/23 1630) BP: (117-163)/(43-102) 147/55 (09/23 1630) SpO2:  [80 %-100 %] 95 % (09/23 1630) Last BM Date : 12/12/22  Weight change: Filed Weights   12/08/22 1749 12/11/22 0859 12/11/22 1239  Weight: 113.7 kg 30.5 kg 32 kg   Intake/Output:   Intake/Output Summary (Last 24 hours) at 12/14/2022 1638 Last data filed at 12/14/2022 0600 Gross per 24 hour  Intake --  Output 350 ml  Net -350 ml    Physical Exam   General:  Sitting up in bed in HD No resp difficulty HEENT: normal Neck: supple. LIJ HD cath  Carotids 2+ bilat; no bruits. No lymphadenopathy or thryomegaly appreciated. Cor: irreg 2/6 TR Lungs: clear Abdomen: obese soft, nontender, nondistended. No hepatosplenomegaly. No bruits or masses. Good bowel sounds. Extremities: no cyanosis, clubbing, rash, severe stasis dermatitis. mild edema Neuro: alert & orientedx3, cranial nerves grossly intact. moves all 4 extremities w/o difficulty. Affect pleasant   Telemetry   Atrial fibrillation rate 40-50ss, some 30s overnight (personally reviewed)  Labs    Labs and imaging reviewed in Epic.  Medications:   Scheduled Medications:  [MAR Hold] vitamin C  500 mg Oral BID   [MAR Hold] Chlorhexidine Gluconate Cloth  6 each Topical Daily   [MAR Hold] cholecalciferol  2,000 Units Oral Daily   [MAR Hold] docusate sodium  100 mg Oral BID   [MAR Hold] doxazosin  1 mg Oral BID   [MAR Hold] epoetin (EPOGEN/PROCRIT) injection  10,000 Units Intravenous Q  M,W,F-HD   [MAR Hold] feeding supplement (NEPRO CARB STEADY)  237 mL Oral TID BM   [MAR Hold] insulin aspart  0-15 Units Subcutaneous TID AC & HS   [MAR Hold] insulin aspart  3 Units Subcutaneous TID WC   [MAR Hold] multivitamin  1 tablet Oral QHS   [MAR Hold] neomycin-polymyxin b-dexamethasone  1 Application Right Eye TID   [MAR Hold] omega-3 acid ethyl esters  1 g Oral Daily   [MAR Hold] rosuvastatin  10 mg Oral Once per day on Monday Wednesday Friday   [facility] Hold] sodium chloride flush  3 mL Intravenous Q12H    Infusions:  [MAR Hold] sodium chloride     heparin      PRN Medications: [MAR Hold] sodium chloride, [MAR Hold] acetaminophen **OR** [MAR Hold] acetaminophen, [MAR Hold] calamine, [MAR Hold] diphenhydrAMINE-zinc acetate, [MAR Hold] fentaNYL (SUBLIMAZE) injection, [MAR Hold] hydrocortisone cream, [MAR Hold] hydrOXYzine, [MAR Hold] ipratropium-albuterol, [MAR Hold] ondansetron **OR** [MAR Hold] ondansetron (ZOFRAN) IV, [MAR Hold] oxyCODONE, [MAR Hold] polyethylene glycol, [MAR Hold] sodium chloride flush, [MAR Hold] traZODone  Assessment/Plan   1. Acute on chronic diastolic CHF: With prominent RV failure.  Echo this admission with EF 55-60%, mild LVH, moderate RV enlargement with mildly decreased RV systolic function, PASP 73 mmHg, severe LAE, mild-moderate TR, IVC dilated.  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 With significant volume overload, he was started on milrinone for RV  support (no PICC with CKD IV) and diuresed with Lasix gtt.  Initial good response but UOP fell off significantly and creatinine up to 4.2, started CVVH on 9/12.  RHC 9/11 showed both right and left sided failure but RV failure predominated with RA pressure > 20. Milrinone was weaned and patient is currently showing to be HD dependent. Seen by surgery for consideration of PD catheter, but not a candidate at this time - Volume status well maintained with HD + oral lasix. Management of HD and  diuretics per Renal. Euvolemic on exam today. Would maintain this as dry weight. Planning to start outpatient HD.  - Agree with placement of tunneled cath - PD may be an option down the road, but not a candidate currently  2. AKI on CKD stage IV: Complicates CHF.   As above, difficult situation with severe predominantly RV failure/volume overload and cardiorenal syndrome.  Started on CVVH on 9/12 with UOP falling off despite high doses of diuretics, transitioned to iHD. - Resumed on iHD, his frailty and cardiopulmonary comorbid conditions make long term dialysis difficult.  - Management as above  3.  Atrial fibrillation: Permanent. Bradycardic currently - Apixaban 2.5 bid on hold. Continue heparin for now.  D/w PharmD - having bradycardia. - Ventricular rates 40-50s but go into 60-70 range on standing. Nighttime brady into 30s is asymptomatic - No role for PPM currently especially in light of need for HD and additional nidus for blood stream infection  4. CAD: Remote PCI to LAD and diagonal.  .  - No s/s angina Continue statin - No ASA with apixaban use.   5. 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   - Management as above  HF will sign off.    Length of Stay: [address], DO  12/14/2022, 4:38 PM  Advanced Heart Failure Team Pager [phone] (M-F; 7a - 5p)  Please contact [facility] for night-coverage after hours (5p -7a ) and weekends on [email or web address]

## 2022-12-14 NOTE — Consult Note (Signed)
Hospital Consult    Reason for Consult:  Need for Dialysis Perma Catheter Insertion Requesting Physician:  Malachi Carl NP  MRN #:  [id number]  History of Present Illness: This is a 82 y.o. male with diabetes mellitus type II, hypertension, coronary artery disease, chronic diastolic congestive heart failure, atrial fibrillation, pulmonary hypertension, and historyof subdural hematoma who is admitted on 11/26/2022 for Acute on chronic diastolic CHF and Hypervolemia. Now with ESRD requiring dialysis.    Past Medical History:  Diagnosis Date   AKI (acute kidney injury) (HCC) 07/06/2018   CAD (coronary artery disease)    a. Remote PCI/stenting to LAD w PTCA Diagnoal. RCA 60%; b.  2005/2008 Cardiolites w/ reportedly mild ischemia in Diag territory-->Med rx.   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 04/2019 Echo: EF 55-60%, no rwma, nl RV fxn, RVSP 55.34mmHg. Mod dil LA. Mild-mod MR. Mod dil PA.   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 04/2019 Echo: RVSP 55.23mmHg.   Permanent atrial fibrillation (HCC)    a. CHA2DS2VASc = 5-->dose adjusted eliquis (age/creat).   Subdural hematoma (HCC)    a. 01/2021 in setting of fall s/p L frontotemporal craniotomy.    Past Surgical History:  Procedure Laterality Date   BLEPHAROPLASTY     CARDIAC CATHETERIZATION     CORONARY STENT INTERVENTION     CRANIOTOMY Left 02/04/2021   Procedure: CRANIOTOMY FOR LEFT SUBDURAL  HEMATOMA EVACUATION;  Surgeon: Lucy Chris, MD;  Location: [facility] ORS;  Service: Neurosurgery;  Laterality: Left;   CYSTOSCOPY     HERNIA REPAIR     RIGHT HEART CATH N/A 12/02/2022   Procedure: RIGHT HEART CATH;  Surgeon: Laurey Morale, MD;   Location: [facility] INVASIVE CV LAB;  Service: Cardiovascular;  Laterality: N/A;    Allergies  Allergen Reactions   Atorvastatin Hives, Itching and Other (See Comments)    Other reaction(s): Other (See Comments)   Simvastatin Hives, Itching and Other (See Comments)    Other reaction(s): Other (See Comments)    Prior to Admission medications   Medication Sig Start Date End Date Taking? Authorizing Provider  apixaban (ELIQUIS) 2.5 MG TABS tablet Take 1 tablet (2.5 mg total) by mouth 2 (two) times daily. 11/26/22  Yes Bensimhon, Bevelyn Buckles, MD  Ascorbic Acid (VITAMIN C PO) Take 1 tablet by mouth in the morning.   Yes [provider]  Cholecalciferol (VITAMIN D3) 1.25 MG (50000 UT) CAPS Take 1 tablet by mouth in the morning.   Yes [provider]  Coenzyme Q10-Vitamin E (QUNOL ULTRA COQ10 PO) Take 1 capsule by mouth in the morning.   Yes [provider]  doxazosin (CARDURA) 1 MG tablet TAKE 1 TABLET(1 MG) BY MOUTH TWICE DAILY 10/05/22  Yes Gollan, Tollie Pizza, MD  Flaxseed, Linseed, (FLAXSEED OIL PO) Take 1 capsule by mouth in the morning.   Yes [provider]  Furosemide (FUROSCIX) 80 MG/10ML CTKT Inject 80 mg into the skin as directed. 07/03/22  Yes Bensimhon, Bevelyn Buckles, MD  furosemide (LASIX) 40 MG tablet Take 1 tablet daily every other day, alternating with 2 tabs daily every other day 09/21/22  Yes Bensimhon, Bevelyn Buckles, MD  glipiZIDE (GLUCOTROL XL)  5 MG 24 hr tablet Take 1 tablet (5 mg total) by mouth daily with breakfast. Patient taking differently: Take 10 mg by mouth daily with breakfast. 07/27/22  Yes [name], MD  hydrALAZINE (APRESOLINE) 100 MG tablet TAKE 1 TABLET BY MOUTH THREE TIMES DAILY, WITH EXTRA 100 MG AS NEEDED FOR HIGH PRESSURE 10/05/22  Yes Gollan, Tollie Pizza, MD  isosorbide mononitrate (IMDUR) 30 MG 24 hr tablet Take 1 tablet (30 mg total) by mouth in the morning, at noon, and at bedtime. 07/10/22  Yes Bensimhon, Bevelyn Buckles, MD  Multiple Vitamin  (MULTIVITAMIN WITH MINERALS) TABS tablet Take 1 tablet by mouth in the morning.   Yes [provider]  neomycin-polymyxin b-dexamethasone (MAXITROL) 3.5-10000-0.1 OINT Place 1 Application into the right eye 3 (three) times daily. 11/02/22  Yes [provider]  nitroGLYCERIN (NITROLINGUAL) 0.4 MG/SPRAY spray Place 1 spray under the tongue as directed. 06/22/22 07/11/23 Yes Bensimhon, Bevelyn Buckles, MD  Omega-3 Fatty Acids (FISH OIL PO) Take 1 capsule by mouth in the morning.   Yes [provider]  rosuvastatin (CRESTOR) 10 MG tablet Take 1 tablet (10 mg total) by mouth 3 (three) times a week. 03/05/22  Yes Bensimhon, Bevelyn Buckles, MD  Turmeric (QC TUMERIC COMPLEX PO) Take 1 capsule by mouth in the morning. Qunol Turmeric Curcumin- 40 MG   Yes [provider]    Social History   Socioeconomic History   Marital status: Married    Spouse name: Not on file   Number of children: Not on file   Years of education: Not on file   Highest education level: Not on file  Occupational History   Not on file  Tobacco Use   Smoking status: Former   Smokeless tobacco: Never  Vaping Use   Vaping status: Never Used  Substance and Sexual Activity   Alcohol use: Not Currently    Alcohol/week: 2.0 standard drinks of alcohol    Types: 2 Glasses of wine per week   Drug use: Not Currently   Sexual activity: Not on file  Other Topics Concern   Not on file  Social History Narrative   Lives at home with wife   Social Determinants of Health   Financial Resource Strain: Not on file  Food Insecurity: No Food Insecurity (12/01/2022)   Hunger Vital Sign    Worried About Running Out of Food in the Last Year: Never true    Ran Out of Food in the Last Year: Never true  Transportation Needs: No Transportation Needs (12/01/2022)   PRAPARE - Administrator, Civil Service (Medical): No    Lack of Transportation (Non-Medical): No  Physical Activity: Not on file  Stress: Not on file   Social Connections: Not on file  Intimate Partner Violence: Not At Risk (12/01/2022)   Humiliation, Afraid, Rape, and Kick questionnaire    Fear of Current or Ex-Partner: No    Emotionally Abused: No    Physically Abused: No    Sexually Abused: No     Family History  Problem Relation Age of Onset   Pancreatic cancer Mother    CAD Father    Diabetes Brother     ROS: Otherwise negative unless mentioned in HPI  Physical Examination  Vitals:   12/14/22 0800 12/14/22 1000  BP: (!) 117/102 (!) 124/96  Pulse: (!) 47   Resp: 20   Temp: 98 F (36.7 C)   SpO2: 99%    Body mass index is 10.73 kg/m.  General:  WDWN in NAD Gait: Not observed HENT: WNL, normocephalic Pulmonary: normal non-labored breathing, without Rales, rhonchi,  wheezing Cardiac: regular, without  Murmurs, rubs or gallops; without carotid bruits Abdomen: positive bowel sounds throughout, soft, NT/ND, no masses Skin: without rashes Vascular Exam/Pulses: Palpable Upper extremity pulses, Unable to palpate bilateral lower extremity pulses due to +2 edema.  Extremities: without ischemic changes, without Gangrene , without cellulitis; without open wounds;  Musculoskeletal: no muscle wasting or atrophy  Neurologic: A&O X 3;  No focal weakness or paresthesias are detected; speech is fluent/normal Psychiatric:  The pt has Normal affect. Lymph:  Unremarkable  CBC    Component Value Date/Time   WBC 8.1 12/14/2022 0729   RBC 3.25 (L) 12/14/2022 0729   HGB 9.9 (L) 12/14/2022 0729   HGB 12.1 (L) 05/16/2019 1623   HCT 31.0 (L) 12/14/2022 0729   HCT 38.8 05/16/2019 1623   PLT 170 12/14/2022 0729   PLT 246 05/16/2019 1623   MCV 95.4 12/14/2022 0729   MCV 88 05/16/2019 1623   MCH 30.5 12/14/2022 0729   MCHC 31.9 12/14/2022 0729   RDW 15.0 12/14/2022 0729   RDW 14.5 05/16/2019 1623   LYMPHSABS 0.4 (L) 12/03/2022 0220   LYMPHSABS 1.5 05/16/2019 1623   MONOABS 0.8 12/03/2022 0220   EOSABS 0.3 12/03/2022 0220    EOSABS 0.4 05/16/2019 1623   BASOSABS 0.0 12/03/2022 0220   BASOSABS 0.0 05/16/2019 1623    BMET    Component Value Date/Time   NA 138 12/14/2022 0729   NA 140 07/16/2020 1217   K 4.5 12/14/2022 0729   CL 101 12/14/2022 0729   CO2 25 12/14/2022 0729   GLUCOSE 132 (H) 12/14/2022 0729   BUN 67 (H) 12/14/2022 0729   BUN 59 (H) 07/16/2020 1217   CREATININE 4.39 (H) 12/14/2022 0729   CALCIUM 8.5 (L) 12/14/2022 0729   GFRNONAA 13 (L) 12/14/2022 0729   GFRAA 33 (L) 10/18/2019 1111    COAGS: Lab Results  Component Value Date   INR 1.2 12/07/2022   INR 1.6 (H) 12/03/2022   INR 1.2 01/28/2021     Non-Invasive Vascular Imaging:   None Ordered.   Statin:  Yes.   Beta Blocker:  No. Aspirin:  No. ACEI:  No. ARB:  No. CCB use:  No Other antiplatelets/anticoagulants:  Yes.   Eliquis 2.5 mg twice daily   ASSESSMENT/PLAN: This is a 82 y.o. male who presents to [facility] ER with Acute exacerbation of CHF and CKD. Vascular Surgery consulted for placement of Dialysis access in Perma Catheter.   Vascular Surgery plans on taking the patient to the vascular lab later today.  I had a detailed discussion with the patient and the patients POA at the bedside this morning. We discussed in detail the procedure, benefits, risks and complications. Both verbalized there understanding and wish to proceed. I answered all of there questions this morning. Patient has been NPO since midnight last night.    -I discussed in detail the plan with Dr Festus Barren MD and he is in agreement with the plan.    Marcie Bal [facility] 12/14/2022 10:43 AM

## 2022-12-14 NOTE — Op Note (Signed)
OPERATIVE NOTE    PRE-OPERATIVE DIAGNOSIS: 1. ESRD   POST-OPERATIVE DIAGNOSIS: same as above  PROCEDURE: Ultrasound guidance for vascular access to the right internal jugular vein Fluoroscopic guidance for placement of catheter Placement of a 19 cm tip to cuff tunneled hemodialysis catheter via the right internal jugular vein  SURGEON: Festus Barren, MD  ANESTHESIA:  Local with Moderate conscious sedation for approximately 25 minutes using 1 mg of Versed and 25 mcg of Fentanyl  ESTIMATED BLOOD LOSS: 50 cc  FLUORO TIME: less than one minute  CONTRAST: none  FINDING(S): 1.  Patent right internal jugular vein  SPECIMEN(S):  None  INDICATIONS:   Kyle Roberson is a 82 y.o.male who presents with renal failure.  The patient needs long term dialysis access for their ESRD, and a Permcath is necessary.  Risks and benefits are discussed and informed consent is obtained.    DESCRIPTION: After obtaining full informed written consent, the patient was brought back to the vascular suited. The patient's right neck and chest were sterilely prepped and draped in a sterile surgical field was created. Moderate conscious sedation was administered during a face to face encounter with the patient throughout the procedure with my supervision of the RN administering medicines and monitoring the patient's vital signs, pulse oximetry, telemetry and mental status throughout from the start of the procedure until the patient was taken to the recovery room.  The right internal jugular vein was visualized with ultrasound and found to be patent. It was then accessed under direct ultrasound guidance and a permanent image was recorded. A wire was placed. After skin nick and dilatation, the peel-away sheath was placed over the wire. I then turned my attention to an area under the clavicle. Approximately 1-2 fingerbreadths below the clavicle a small counterincision was created and tunneled from the subclavicular  incision to the access site. Using fluoroscopic guidance, a 19 centimeter tip to cuff tunneled hemodialysis catheter was selected, and tunneled from the subclavicular incision to the access site. It was then placed through the peel-away sheath and the peel-away sheath was removed. Using fluoroscopic guidance the catheter tips were parked in the right atrium. The appropriate distal connectors were placed. It withdrew blood well and flushed easily with heparinized saline and a concentrated heparin solution was then placed. It was secured to the chest wall with 2 Prolene sutures. The access incision was closed single 4-0 Monocryl. A 4-0 Monocryl pursestring suture was placed around the exit site. Sterile dressings were placed. The patient tolerated the procedure well and was taken to the recovery room in stable condition.  COMPLICATIONS: None  CONDITION: Stable  Festus Barren, MD 12/14/2022 3:59 PM   This note was created with Dragon Medical transcription system. Any errors in dictation are purely unintentional.

## 2022-12-14 NOTE — Discharge Planning (Signed)
Following for OPHD placement at [facility]. Referral is under review.  Dimas Chyle Dialysis Coordinator II  [facility] Cell: [phone] eFax: [phone] [email or web address]

## 2022-12-14 NOTE — Consult Note (Signed)
ANTICOAGULATION CONSULT NOTE  Pharmacy Consult for IV Heparin Indication: atrial fibrillation  Patient Measurements: Height: 5\' 8"  (172.7 cm) Weight: 32 kg (70 lb 8.8 oz) IBW/kg (Calculated) : 68.4 Heparin Dosing Weight: 93 kg  Labs: Recent Labs    12/12/22 0552 12/13/22 0639 12/13/22 1552 12/13/22 2336 12/14/22 0729  HGB 10.1* 10.3*  --   --  9.9*  HCT 31.7* 33.4*  --   --  31.0*  PLT 168 174  --   --  170  HEPARINUNFRC 0.30 0.24* 0.38 0.39 0.36  CREATININE 3.02* 3.66*  --   --   --     Estimated Creatinine Clearance: 6.9 mL/min (A) (by C-G formula based on SCr of 3.66 mg/dL (H)).   Medical History: Past Medical History:  Diagnosis Date   AKI (acute kidney injury) (HCC) 07/06/2018   CAD (coronary artery disease)    a. Remote PCI/stenting to LAD w PTCA Diagnoal. RCA 60%; b.  2005/2008 Cardiolites w/ reportedly mild ischemia in Diag territory-->Med rx.   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 04/2019 Echo: EF 55-60%, no rwma, nl RV fxn, RVSP 55.3mmHg. Mod dil LA. Mild-mod MR. Mod dil PA.   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 04/2019 Echo: RVSP 55.52mmHg.   Permanent atrial fibrillation (HCC)    a. CHA2DS2VASc = 5-->dose adjusted eliquis (age/creat).   Subdural hematoma (HCC)    a. 01/2021 in setting of fall s/p L frontotemporal craniotomy.    Medications:  Apixaban 2.5 mg BID (last dose 9/12 at 1100)  Assessment: 82 y/o M with PMH as above admitted with acute on chronic diastolic CHF / RV failure complicated by CKD / cardiorenal syndrome. Patient is now being started on CRRT. Pharmacy consulted to transition apixaban to heparin for now in case further procedures  required.  Date/Time HL Comment 9/18 0400 0.41 9/19 0444 0.38 9/20 1230  0.3 9/21 0552  0.3 9/22 0639  0.24 subtherapeutic  9/22 1552  0.38 therapeutic x 1 9/22 2336  0.39 therapeutic x 2 9/23 0729 0.36 Therapeutic x 3 @ 1400 un/hr  Goal of Therapy:  Heparin level 0.3-0.7 units/ml Monitor platelets by anticoagulation protocol: Yes   Plan:  HL remain therapeutic with heparin IV rate at 1400 units/hr. - Continue heparin infusion current rate - Recheck HL daily w/ AM labs while therapeutic - CBC daily.   Shewanda Sharpe Rodriguez-Guzman PharmD, BCPS 12/14/2022 8:20 AM

## 2022-12-14 NOTE — Progress Notes (Signed)
Patient agitated and c/o back itching.  Staff offered to reposition, patient refused.  Patient and wife do not want him to receive prn atarax for itching due to making him more agitated.

## 2022-12-14 NOTE — Plan of Care (Signed)
  Problem: Education: Goal: Ability to describe self-care measures that may prevent or decrease complications (Diabetes Survival Skills Education) will improve Outcome: Progressing Goal: Individualized Educational Video(s) Outcome: Progressing   Problem: Coping: Goal: Ability to adjust to condition or change in health will improve Outcome: Progressing   Problem: Fluid Volume: Goal: Ability to maintain a balanced intake and output will improve Outcome: Progressing   Problem: Health Behavior/Discharge Planning: Goal: Ability to identify and utilize available resources and services will improve Outcome: Progressing Goal: Ability to manage health-related needs will improve Outcome: Progressing   Problem: Metabolic: Goal: Ability to maintain appropriate glucose levels will improve Outcome: Progressing   Problem: Nutritional: Goal: Maintenance of adequate nutrition will improve Outcome: Progressing Goal: Progress toward achieving an optimal weight will improve Outcome: Progressing   Problem: Skin Integrity: Goal: Risk for impaired skin integrity will decrease Outcome: Progressing   Problem: Tissue Perfusion: Goal: Adequacy of tissue perfusion will improve Outcome: Progressing   Problem: Education: Goal: Knowledge of General Education information will improve Description: Including pain rating scale, medication(s)/side effects and non-pharmacologic comfort measures Outcome: Progressing   Problem: Health Behavior/Discharge Planning: Goal: Ability to manage health-related needs will improve Outcome: Progressing   Problem: Clinical Measurements: Goal: Ability to maintain clinical measurements within normal limits will improve Outcome: Progressing Goal: Will remain free from infection Outcome: Progressing Goal: Diagnostic test results will improve Outcome: Progressing Goal: Respiratory complications will improve Outcome: Progressing Goal: Cardiovascular complication will  be avoided Outcome: Progressing   Problem: Activity: Goal: Risk for activity intolerance will decrease Outcome: Progressing   Problem: Nutrition: Goal: Adequate nutrition will be maintained Outcome: Progressing   Problem: Coping: Goal: Level of anxiety will decrease Outcome: Progressing   Problem: Elimination: Goal: Will not experience complications related to bowel motility Outcome: Progressing Goal: Will not experience complications related to urinary retention Outcome: Progressing   Problem: Pain Managment: Goal: General experience of comfort will improve Outcome: Progressing   Problem: Safety: Goal: Ability to remain free from injury will improve Outcome: Progressing   Problem: Skin Integrity: Goal: Risk for impaired skin integrity will decrease Outcome: Progressing   Problem: Education: Goal: Understanding of CV disease, CV risk reduction, and recovery process will improve Outcome: Progressing Goal: Individualized Educational Video(s) Outcome: Progressing   Problem: Activity: Goal: Ability to return to baseline activity level will improve Outcome: Progressing   Problem: Cardiovascular: Goal: Ability to achieve and maintain adequate cardiovascular perfusion will improve Outcome: Progressing Goal: Vascular access site(s) Level 0-1 will be maintained Outcome: Progressing   Problem: Health Behavior/Discharge Planning: Goal: Ability to safely manage health-related needs after discharge will improve Outcome: Progressing   Problem: Education: Goal: Knowledge of disease and its progression will improve Outcome: Progressing   Problem: Fluid Volume: Goal: Compliance with measures to maintain balanced fluid volume will improve Outcome: Progressing   Problem: Health Behavior/Discharge Planning: Goal: Ability to manage health-related needs will improve Outcome: Progressing   Problem: Nutritional: Goal: Ability to make healthy dietary choices will  improve Outcome: Progressing   Problem: Clinical Measurements: Goal: Complications related to the disease process, condition or treatment will be avoided or minimized Outcome: Progressing

## 2022-12-14 NOTE — Progress Notes (Signed)
PROGRESS NOTE    Kyle Roberson   BJY:[id number] DOB: 08-03-1940  DOA: 11/26/2022 Date of Service: 12/14/22 which is hospital day 18  PCP: [name], MD   HPI:  [phone] with h/o CAD, stage 5 CKD, chronic diastolic CHF, BLE lymphedema, afib on Eliquis, PAH, and SDH presented on 09/05 with weight gain and SOB.    Hospital course / major events:  He was diagnosed with acute on chronic CHF and started on Lasix drip.  Cardiology was consulted. Echo this admission with EF 55-60%, mild LVH, moderate RV enlargement with mildly decreased RV systolic function, PASP 73 mmHg, severe LAE, mild-moderate TR, IVC dilated.  IV midodrine drip 09/09.   RHC done on 09/11 with elevated R > L filling pressures, mostly right-sided, worsening renal function and decreased UOP so Lasix stopped.  Started on CRRT 09/12 for additional volume removal and completed this. 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  He is on IV Lasix.  Remains on heparin gtt for Afib, has been bradycardic w/ ventricular rates 40-50s but go into 60-70 range on standing. Awaiting permcath placement 12/14/22 and outpatient HD plan   Consultants:  Cardiology - advanced HF team  Nephrology  PCCM Palliative care Surgery PT/OT [facility] team  Procedures: Echocardiogram 9/6 CVC 9/12 RHC on 9/11 CRRT HD 9/17, 9/18, 09/20       ASSESSMENT & PLAN:   Acute on chronic diastolic congestive heart failure Pulmonary HTN, severe - group 2 PH (from elevated LVEDP  Presented with volume overload with increasing weight up to 255 pounds. 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 Bradycardic currently Apixaban 2.5 bid on hold. Continue heparin for now per cardiology  No role for PPM currently especially in light of need for HD and additional nidus for blood stream infection   Anemia of chronic inflammation, cardiorenal disease ESRD Starting Epo  Hyperphosphatemia Binders per nephro  L ankle pain Fell in the ER Xray ordered and negative CAM walker for use with ambulation   Hypertension Continue doxazosin, hydralazine   Hyperlipidemia Continue rosuvastatin, Lovaza   CAD with remote PCI Continue Imdur, statin No ASA No current c/o CP   History of paroxysmal a fib Bradycardia currently so not on rate controlling medications On heparin drip for now, plan resume Eliquis per cardiology    Goals of care Patient appears disinterested in aggressive care but his wife is insistent She has declined palliative care consultation, "he's not there yet" He has a very poor overall prognosis based on cardiorenal failure and is not a good long-term HD candidate Ongoing discussions are encouraged regarding code status as well as possibly EOL care  Morbid obesity Complicates overall prognosis Body mass index is 36.5 kg/m.Marland Kitchen  Nutrition Problem: Increased nutrient needs Etiology: acute illness (CRRT) Signs/Symptoms: estimated needs   Pressure ulcer  Pressure Injury 12/03/22 Buttocks Right;Left Stage 1 -  Intact skin with non-blanchable redness of a localized area usually over a bony  prominence. Purple/blue (Active)  12/03/22 0820  Location: Buttocks  Location Orientation: Right;Left  Staging: Stage 1 -  Intact skin with non-blanchable redness of a localized area usually over a bony prominence.  Wound Description (Comments): Purple/blue  Present on Admission:           Morbid obesity based on BMI: Body mass index is 10.73 kg/m.  And serious comorbid medical conditions   DVT prophylaxis: on heparin gtt IV fluids: no continuous IV fluids  Nutrition: low sodium Central lines / invasive devices:  [facility] HD cath   Code Status: FULL CODE ACP documentation reviewed: 12/12/22 none on file in [facility]  TOC needs: TBD. Renal navigator working on HD outpatient  Barriers to dispo / significant pending items: permcath placement early next week             Subjective / Brief ROS:  Patient reports no concerns today other than back pain which is about same as it' sbeen  Denies new weakness.  Tolerating diet.  Reports no concerns w/ urination/defecation.   Family Communication: wife is present at bedside on rounds     Objective Findings:  Vitals:   12/14/22 1200 12/14/22 1226 12/14/22 1300 12/14/22 1454  BP: (!) 146/44   (!) 136/53  Pulse: (!) 42 (!) 48 (!) 44 (!) 44  Resp: (!) 23 (!) 23 (!) 27 15  Temp:    97.7 F (36.5 C)  TempSrc:    Oral  SpO2: 98% 94% 98% 94%  Weight:      Height:        Intake/Output Summary (Last 24 hours) at 12/14/2022 1523 Last data filed at 12/14/2022 0600 Gross per 24 hour  Intake --  Output 350 ml  Net -350 ml    Filed Weights   12/08/22 1749 12/11/22 0859 12/11/22 1239  Weight: 113.7 kg 30.5 kg 32 kg    Examination:  Physical Exam Constitutional:      General: He is not in acute distress.    Appearance: He is ill-appearing.  Cardiovascular:     Rate and Rhythm: Normal rate.     Heart sounds: Murmur heard.  Pulmonary:     Effort: Pulmonary effort is normal. No respiratory distress.     Breath sounds:  Examination of the right-lower field reveals decreased breath sounds. Examination of the left-lower field reveals decreased breath sounds. Decreased breath sounds and wheezing present.     Comments: Effort appears easier compared to yesterday  Abdominal:     Palpations: Abdomen is soft.  Skin:    General: Skin is warm and dry.  Neurological:     General: No focal deficit present.     Mental Status: He is alert and oriented to person, place, and time. Mental status is at baseline.  Psychiatric:        Mood and Affect: Mood normal.        Behavior: Behavior normal.          Scheduled Medications:   [MAR Hold] vitamin C  500 mg Oral BID   [MAR Hold] Chlorhexidine Gluconate Cloth  6 each Topical Daily   [MAR Hold] cholecalciferol  2,000 Units Oral Daily   [MAR Hold]  docusate sodium  100 mg Oral BID   [MAR Hold] doxazosin  1 mg Oral BID   [MAR Hold] epoetin (EPOGEN/PROCRIT) injection  10,000 Units Intravenous Q M,W,F-HD   [MAR Hold] feeding supplement (NEPRO CARB STEADY)  237 mL Oral TID BM   [MAR Hold] insulin aspart  0-15 Units Subcutaneous TID AC & HS   [MAR Hold] insulin aspart  3 Units Subcutaneous TID WC   [MAR Hold] multivitamin  1 tablet Oral QHS   [MAR Hold] neomycin-polymyxin b-dexamethasone  1 Application Right Eye TID   [MAR Hold] omega-3 acid ethyl esters  1 g Oral Daily   [MAR Hold] rosuvastatin  10 mg Oral Once per day on Monday Wednesday Friday   [facility] Hold] sodium chloride flush  3 mL Intravenous Q12H    Continuous Infusions:  [MAR Hold] sodium chloride     sodium chloride 10 mL/hr at 12/14/22 1504    ceFAZolin (ANCEF) IV     heparin 1,400 Units/hr (12/14/22 0711)    PRN Medications:  [MAR Hold] sodium chloride, [MAR Hold] acetaminophen **OR** [MAR Hold] acetaminophen, [MAR Hold] calamine, diphenhydrAMINE, [MAR Hold] diphenhydrAMINE-zinc acetate, famotidine, fentaNYL (SUBLIMAZE) injection, [MAR Hold] fentaNYL (SUBLIMAZE) injection, [MAR Hold] hydrocortisone  cream, HYDROmorphone (DILAUDID) injection, [MAR Hold] hydrOXYzine, [MAR Hold] ipratropium-albuterol, methylPREDNISolone (SOLU-MEDROL) injection, midazolam, [MAR Hold] ondansetron **OR** [MAR Hold] ondansetron (ZOFRAN) IV, ondansetron (ZOFRAN) IV, [MAR Hold] oxyCODONE, [MAR Hold] polyethylene glycol, [MAR Hold] sodium chloride flush, [MAR Hold] traZODone  Antimicrobials from admission:  Anti-infectives (From admission, onward)    Start     Dose/Rate Route Frequency Ordered Stop   12/14/22 1414  ceFAZolin (ANCEF) IVPB 1 g/50 mL premix        1 g 100 mL/hr over 30 Minutes Intravenous 30 min pre-op 12/14/22 1414             Data Reviewed:  I have personally reviewed the following...  CBC: Recent Labs  Lab 12/10/22 0444 12/11/22 0440 12/12/22 0552 12/13/22 0639 12/14/22 0729  WBC 6.2 9.2 8.5 8.4 8.1  HGB 9.9* 9.7* 10.1* 10.3* 9.9*  HCT 31.8* 29.9* 31.7* 33.4* 31.0*  MCV 94.1 92.9 93.2 96.3 95.4  PLT 158 162 168 174 170   Basic Metabolic Panel: Recent Labs  Lab 12/10/22 0444 12/11/22 0440 12/12/22 0552 12/13/22 0639 12/14/22 0729  NA 135 136 134* 137 138  K 4.3 4.5 4.3 4.6 4.5  CL 99 99 98 101 101  CO2 26 25 26 26 25   GLUCOSE 136* 101* 92 99 132*  BUN 57* 68* 46* 57* 67*  CREATININE 3.19* 3.67* 3.02* 3.66* 4.39*  CALCIUM 8.3* 8.3* 8.4* 8.6* 8.5*  PHOS 5.2* 6.1* 4.4 5.8* 6.6*   GFR: Estimated Creatinine Clearance: 5.8 mL/min (A) (by C-G formula based on SCr of 4.39 mg/dL (H)). Liver Function Tests: Recent Labs  Lab 12/10/22 0444 12/11/22 0440 12/12/22 0552 12/13/22 0639 12/14/22 0729  ALBUMIN 3.1* 3.2* 3.3* 3.3* 3.3*   No results for input(s): "LIPASE", "AMYLASE" in the last 168 hours. No results for input(s): "AMMONIA" in the last 168 hours. Coagulation Profile: No results for input(s): "INR", "PROTIME" in the last 168 hours.  Cardiac Enzymes: No results for input(s): "CKTOTAL", "CKMB", "CKMBINDEX", "TROPONINI" in the last 168 hours. BNP (last 3  results) No results for input(s): "PROBNP" in the last 8760 hours. HbA1C: No results for input(s): "HGBA1C" in the last 72 hours. CBG: Recent Labs  Lab 12/13/22 1608 12/13/22 2151 12/14/22 0034 12/14/22 0832 12/14/22 1214  GLUCAP 159* 220* 189* 117* 104*  Lipid Profile: No results for input(s): "CHOL", "HDL", "LDLCALC", "TRIG", "CHOLHDL", "LDLDIRECT" in the last 72 hours. Thyroid Function Tests: No results for input(s): "TSH", "T4TOTAL", "FREET4", "T3FREE", "THYROIDAB" in the last 72 hours. Anemia Panel: No results for input(s): "VITAMINB12", "FOLATE", "FERRITIN", "TIBC", "IRON", "RETICCTPCT" in the last 72 hours. Most Recent Urinalysis On File:     Component Value Date/Time   COLORURINE YELLOW (A) 02/04/2021 1358   APPEARANCEUR CLEAR (A) 02/04/2021 1358   LABSPEC 1.014 02/04/2021 1358   PHURINE 5.0 02/04/2021 1358   GLUCOSEU NEGATIVE 02/04/2021 1358   HGBUR NEGATIVE 02/04/2021 1358   BILIRUBINUR NEGATIVE 02/04/2021 1358   BILIRUBINUR neg 01/10/2019 1628   KETONESUR NEGATIVE 02/04/2021 1358   PROTEINUR 100 (A) 02/04/2021 1358   UROBILINOGEN 0.2 01/10/2019 1628   NITRITE NEGATIVE 02/04/2021 1358   LEUKOCYTESUR NEGATIVE 02/04/2021 1358   Sepsis Labs: @LABRCNTIP (procalcitonin:4,lacticidven:4) Microbiology: No results found for this or any previous visit (from the past 240 hour(s)).    Radiology Studies last 3 days: No results found.           LOS: 18 days    Time spent: 50 min     Sunnie Nielsen, DO [facility] 12/14/2022, 3:23 PM    Dictation software may have been used to generate the above note. Typos may occur and escape review in typed/dictated notes. Please contact Dr Lyn Hollingshead directly for clarity if needed.  Staff may message me via secure chat in Epic  but this may not receive an immediate response,  please page me for urgent matters!  If 7PM-7AM, please contact night coverage [email or web address]

## 2022-12-14 NOTE — Progress Notes (Signed)
Pt refused Bipap, order standing for emergency use if needed.

## 2022-12-14 NOTE — Progress Notes (Addendum)
[facility]  ROUNDING NOTE   Subjective:   Patient resting quietly in bed Alert and oriented No family present N.p.o. for PermCath placement  Dialysis scheduled for later today  Objective:  Vital signs in last 24 hours:  Temp:  [97.4 F (36.3 C)-98.3 F (36.8 C)] 98 F (36.7 C) (09/23 0800) Pulse Rate:  [44-55] 47 (09/23 0800) Resp:  [16-35] 20 (09/23 0800) BP: (117-157)/(43-102) 124/96 (09/23 1000) SpO2:  [94 %-99 %] 99 % (09/23 0800)  Weight change:  Filed Weights   12/08/22 1749 12/11/22 0859 12/11/22 1239  Weight: 113.7 kg 30.5 kg 32 kg    Intake/Output: I/O last 3 completed shifts: In: 240 [P.O.:240] Out: 350 [Urine:350]   Intake/Output this shift:  No intake/output data recorded.  Physical Exam: General: NAD  Head: Normocephalic, atraumatic. Moist oral mucosal membranes  Eyes: Anicteric  Lungs:  Audible wheeze, room air  Heart: bradycardia  Abdomen:  Soft, nontender  Extremities:  ++ peripheral edema.  Neurologic: Alert and oriented, moving all four extremities  Skin: No lesions  Access: None    Basic Metabolic Panel: Recent Labs  Lab 12/10/22 0444 12/11/22 0440 12/12/22 0552 12/13/22 0639 12/14/22 0729  NA 135 136 134* 137 138  K 4.3 4.5 4.3 4.6 4.5  CL 99 99 98 101 101  CO2 26 25 26 26 25   GLUCOSE 136* 101* 92 99 132*  BUN 57* 68* 46* 57* 67*  CREATININE 3.19* 3.67* 3.02* 3.66* 4.39*  CALCIUM 8.3* 8.3* 8.4* 8.6* 8.5*  PHOS 5.2* 6.1* 4.4 5.8* 6.6*    Liver Function Tests: Recent Labs  Lab 12/10/22 0444 12/11/22 0440 12/12/22 0552 12/13/22 0639 12/14/22 0729  ALBUMIN 3.1* 3.2* 3.3* 3.3* 3.3*   No results for input(s): "LIPASE", "AMYLASE" in the last 168 hours. No results for input(s): "AMMONIA" in the last 168 hours.  CBC: Recent Labs  Lab 12/10/22 0444 12/11/22 0440 12/12/22 0552 12/13/22 0639 12/14/22 0729  WBC 6.2 9.2 8.5 8.4 8.1  HGB 9.9* 9.7* 10.1* 10.3* 9.9*  HCT 31.8* 29.9* 31.7* 33.4* 31.0*  MCV  94.1 92.9 93.2 96.3 95.4  PLT 158 162 168 174 170    Cardiac Enzymes: No results for input(s): "CKTOTAL", "CKMB", "CKMBINDEX", "TROPONINI" in the last 168 hours.  BNP: Invalid input(s): "POCBNP"  CBG: Recent Labs  Lab 12/13/22 1149 12/13/22 1608 12/13/22 2151 12/14/22 0034 12/14/22 0832  GLUCAP 150* 159* 220* 189* 117*    Microbiology: Results for orders placed or performed during the hospital encounter of 11/26/22  MRSA Next Gen by PCR, Nasal     Status: None   Collection Time: 11/30/22  4:10 PM   Specimen: Nasal Mucosa; Nasal Swab  Result Value Ref Range Status   MRSA by PCR Next Gen NOT DETECTED NOT DETECTED Final    Comment: (NOTE) The GeneXpert MRSA Assay (FDA approved for NASAL specimens only), is one component of a comprehensive MRSA colonization surveillance program. It is not intended to diagnose MRSA infection nor to guide or monitor treatment for MRSA infections. Test performance is not FDA approved in patients less than 6 years old. Performed at [facility], [address] [place], [place] [address]     Coagulation Studies: No results for input(s): "LABPROT", "INR" in the last 72 hours.   Urinalysis: No results for input(s): "COLORURINE", "LABSPEC", "PHURINE", "GLUCOSEU", "HGBUR", "BILIRUBINUR", "KETONESUR", "PROTEINUR", "UROBILINOGEN", "NITRITE", "LEUKOCYTESUR" in the last 72 hours.  Invalid input(s): "APPERANCEUR"    Imaging: No results found.   Medications:  sodium chloride     heparin 1,400 Units/hr (12/14/22 0711)    vitamin C  500 mg Oral BID   Chlorhexidine Gluconate Cloth  6 each Topical Daily   cholecalciferol  2,000 Units Oral Daily   docusate sodium  100 mg Oral BID   doxazosin  1 mg Oral BID   epoetin (EPOGEN/PROCRIT) injection  10,000 Units Intravenous Q M,W,F-HD   feeding supplement (NEPRO CARB STEADY)  237 mL Oral TID BM   insulin aspart  0-15 Units Subcutaneous TID AC & HS   insulin aspart  3 Units  Subcutaneous TID WC   multivitamin  1 tablet Oral QHS   neomycin-polymyxin b-dexamethasone  1 Application Right Eye TID   omega-3 acid ethyl esters  1 g Oral Daily   rosuvastatin  10 mg Oral Once per day on Monday Wednesday Friday   sodium chloride flush  3 mL Intravenous Q12H   sodium chloride, acetaminophen **OR** acetaminophen, calamine, diphenhydrAMINE-zinc acetate, fentaNYL (SUBLIMAZE) injection, hydrocortisone cream, hydrOXYzine, ipratropium-albuterol, ondansetron **OR** ondansetron (ZOFRAN) IV, oxyCODONE, polyethylene glycol, sodium chloride flush, traZODone  Assessment/ Plan:  Mr. Ryman Stfleur is a 82 y.o.  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 Due to progression of kidney disease and outpatient diuretic therapy failure, we feel this patient has progressed to end stage renal disease.  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 CRRT from 9/12 to 9/13 -Last treatment received on Friday.   - Vascular surgery will place HD PermCath later today - Next treatment scheduled for Tuesday  -Patient received IV furosemide 120 mg yesterday along with an additional 40 mg during the night.  Little urine output recorded. -Renal navigator aware of need of outpatient clinic.  Following discharge plan.  Acute exacerbation of chronic diastolic congestive heart failure: complicated with acute respiratory failure and pulmonary hypertension. Currently with junctional rhythm. - Room air with wheezing -Will continue fluid management with dialysis and diuresis.  Diabetes mellitus type II with chronic kidney disease - holding  empagliflozin    LOS: 18 Kyle Roberson 9/23/202411:43 AM

## 2022-12-14 NOTE — Progress Notes (Signed)
PT Cancellation Note  Patient Details Name: Kyle Roberson MRN: [id number] DOB: Apr 26, 1940   Cancelled Treatment:    Reason Eval/Treat Not Completed: Other (comment). Pt refusing mobility this AM stated he did not get a chance to sleep well and wants to rest. PT to re-attempt as able.    Olga Coaster PT, DPT 9:47 AM,12/14/22

## 2022-12-14 NOTE — Consult Note (Signed)
ANTICOAGULATION CONSULT NOTE  Pharmacy Consult for IV Heparin Indication: atrial fibrillation  Patient Measurements: Height: 5\' 8"  (172.7 cm) Weight: 32 kg (70 lb 8.8 oz) IBW/kg (Calculated) : 68.4 Heparin Dosing Weight: 93 kg  Labs: Recent Labs    12/11/22 0440 12/11/22 1230 12/12/22 0552 12/13/22 0639 12/13/22 1552 12/13/22 2336  HGB 9.7*  --  10.1* 10.3*  --   --   HCT 29.9*  --  31.7* 33.4*  --   --   PLT 162  --  168 174  --   --   HEPARINUNFRC  --    < > 0.30 0.24* 0.38 0.39  CREATININE 3.67*  --  3.02* 3.66*  --   --    < > = values in this interval not displayed.    Estimated Creatinine Clearance: 6.9 mL/min (A) (by C-G formula based on SCr of 3.66 mg/dL (H)).   Medical History: Past Medical History:  Diagnosis Date   AKI (acute kidney injury) (HCC) 07/06/2018   CAD (coronary artery disease)    a. Remote PCI/stenting to LAD w PTCA Diagnoal. RCA 60%; b.  2005/2008 Cardiolites w/ reportedly mild ischemia in Diag territory-->Med rx.   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 04/2019 Echo: EF 55-60%, no rwma, nl RV fxn, RVSP 55.45mmHg. Mod dil LA. Mild-mod MR. Mod dil PA.   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 04/2019 Echo: RVSP 55.21mmHg.   Permanent atrial fibrillation (HCC)    a. CHA2DS2VASc = 5-->dose adjusted eliquis (age/creat).   Subdural hematoma (HCC)    a. 01/2021 in setting of fall s/p L frontotemporal craniotomy.    Medications:  Apixaban 2.5 mg BID (last dose 9/12 at 1100)  Assessment: 82 y/o M with PMH as above admitted with acute on chronic diastolic CHF / RV failure complicated by CKD / cardiorenal syndrome. Patient is now being started on CRRT. Pharmacy  consulted to transition apixaban to heparin for now in case further procedures required.  9/18 0400 0.41 9/19 0444 0.38 9/20 1230 0.3 9/21 0552 0.3 9/22 0639 0.24, subtherapeutic  9/22 1552 0.38, therapeutic x 1 9/22 2336 0.39, therapeutic x 2  Goal of Therapy:  Heparin level 0.3-0.7 units/ml Monitor platelets by anticoagulation protocol: Yes    Plan:  - HL therapeutic x 2 - Continue heparin infusion at 1400 units/hr.  - Recheck HL daily w/ AM labs while therapeutic - CBC daily.   Otelia Sergeant, PharmD, [facility] 12/14/2022 1:43 AM

## 2022-12-14 NOTE — Interval H&P Note (Signed)
History and Physical Interval Note:  12/14/2022 3:10 PM  Kyle Roberson  has presented today for surgery, with the diagnosis of ESRD.  The various methods of treatment have been discussed with the patient and family. After consideration of risks, benefits and other options for treatment, the patient has consented to  Procedure(s): DIALYSIS/PERMA CATHETER INSERTION (N/A) as a surgical intervention.  The patient's history has been reviewed, patient examined, no change in status, stable for surgery.  I have reviewed the patient's chart and labs.  Questions were answered to the patient's satisfaction.     Festus Barren

## 2022-12-14 NOTE — H&P (View-Only) (Signed)
Hospital Consult    Reason for Consult:  Need for Dialysis Perma Catheter Insertion Requesting Physician:  Malachi Carl NP  MRN #:  [id number]  History of Present Illness: This is a 82 y.o. male with diabetes mellitus type II, hypertension, coronary artery disease, chronic diastolic congestive heart failure, atrial fibrillation, pulmonary hypertension, and historyof subdural hematoma who is admitted on 11/26/2022 for Acute on chronic diastolic CHF and Hypervolemia. Now with ESRD requiring dialysis.    Past Medical History:  Diagnosis Date   AKI (acute kidney injury) (HCC) 07/06/2018   CAD (coronary artery disease)    a. Remote PCI/stenting to LAD w PTCA Diagnoal. RCA 60%; b.  2005/2008 Cardiolites w/ reportedly mild ischemia in Diag territory-->Med rx.   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 04/2019 Echo: EF 55-60%, no rwma, nl RV fxn, RVSP 55.34mmHg. Mod dil LA. Mild-mod MR. Mod dil PA.   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 04/2019 Echo: RVSP 55.23mmHg.   Permanent atrial fibrillation (HCC)    a. CHA2DS2VASc = 5-->dose adjusted eliquis (age/creat).   Subdural hematoma (HCC)    a. 01/2021 in setting of fall s/p L frontotemporal craniotomy.    Past Surgical History:  Procedure Laterality Date   BLEPHAROPLASTY     CARDIAC CATHETERIZATION     CORONARY STENT INTERVENTION     CRANIOTOMY Left 02/04/2021   Procedure: CRANIOTOMY FOR LEFT SUBDURAL  HEMATOMA EVACUATION;  Surgeon: Lucy Chris, MD;  Location: [facility] ORS;  Service: Neurosurgery;  Laterality: Left;   CYSTOSCOPY     HERNIA REPAIR     RIGHT HEART CATH N/A 12/02/2022   Procedure: RIGHT HEART CATH;  Surgeon: Laurey Morale, MD;   Location: [facility] INVASIVE CV LAB;  Service: Cardiovascular;  Laterality: N/A;    Allergies  Allergen Reactions   Atorvastatin Hives, Itching and Other (See Comments)    Other reaction(s): Other (See Comments)   Simvastatin Hives, Itching and Other (See Comments)    Other reaction(s): Other (See Comments)    Prior to Admission medications   Medication Sig Start Date End Date Taking? Authorizing Provider  apixaban (ELIQUIS) 2.5 MG TABS tablet Take 1 tablet (2.5 mg total) by mouth 2 (two) times daily. 11/26/22  Yes Bensimhon, Bevelyn Buckles, MD  Ascorbic Acid (VITAMIN C PO) Take 1 tablet by mouth in the morning.   Yes [provider]  Cholecalciferol (VITAMIN D3) 1.25 MG (50000 UT) CAPS Take 1 tablet by mouth in the morning.   Yes [provider]  Coenzyme Q10-Vitamin E (QUNOL ULTRA COQ10 PO) Take 1 capsule by mouth in the morning.   Yes [provider]  doxazosin (CARDURA) 1 MG tablet TAKE 1 TABLET(1 MG) BY MOUTH TWICE DAILY 10/05/22  Yes Gollan, Tollie Pizza, MD  Flaxseed, Linseed, (FLAXSEED OIL PO) Take 1 capsule by mouth in the morning.   Yes [provider]  Furosemide (FUROSCIX) 80 MG/10ML CTKT Inject 80 mg into the skin as directed. 07/03/22  Yes Bensimhon, Bevelyn Buckles, MD  furosemide (LASIX) 40 MG tablet Take 1 tablet daily every other day, alternating with 2 tabs daily every other day 09/21/22  Yes Bensimhon, Bevelyn Buckles, MD  glipiZIDE (GLUCOTROL XL)  5 MG 24 hr tablet Take 1 tablet (5 mg total) by mouth daily with breakfast. Patient taking differently: Take 10 mg by mouth daily with breakfast. 07/27/22  Yes [name], MD  hydrALAZINE (APRESOLINE) 100 MG tablet TAKE 1 TABLET BY MOUTH THREE TIMES DAILY, WITH EXTRA 100 MG AS NEEDED FOR HIGH PRESSURE 10/05/22  Yes Gollan, Tollie Pizza, MD  isosorbide mononitrate (IMDUR) 30 MG 24 hr tablet Take 1 tablet (30 mg total) by mouth in the morning, at noon, and at bedtime. 07/10/22  Yes Bensimhon, Bevelyn Buckles, MD  Multiple Vitamin  (MULTIVITAMIN WITH MINERALS) TABS tablet Take 1 tablet by mouth in the morning.   Yes [provider]  neomycin-polymyxin b-dexamethasone (MAXITROL) 3.5-10000-0.1 OINT Place 1 Application into the right eye 3 (three) times daily. 11/02/22  Yes [provider]  nitroGLYCERIN (NITROLINGUAL) 0.4 MG/SPRAY spray Place 1 spray under the tongue as directed. 06/22/22 07/11/23 Yes Bensimhon, Bevelyn Buckles, MD  Omega-3 Fatty Acids (FISH OIL PO) Take 1 capsule by mouth in the morning.   Yes [provider]  rosuvastatin (CRESTOR) 10 MG tablet Take 1 tablet (10 mg total) by mouth 3 (three) times a week. 03/05/22  Yes Bensimhon, Bevelyn Buckles, MD  Turmeric (QC TUMERIC COMPLEX PO) Take 1 capsule by mouth in the morning. Qunol Turmeric Curcumin- 40 MG   Yes [provider]    Social History   Socioeconomic History   Marital status: Married    Spouse name: Not on file   Number of children: Not on file   Years of education: Not on file   Highest education level: Not on file  Occupational History   Not on file  Tobacco Use   Smoking status: Former   Smokeless tobacco: Never  Vaping Use   Vaping status: Never Used  Substance and Sexual Activity   Alcohol use: Not Currently    Alcohol/week: 2.0 standard drinks of alcohol    Types: 2 Glasses of wine per week   Drug use: Not Currently   Sexual activity: Not on file  Other Topics Concern   Not on file  Social History Narrative   Lives at home with wife   Social Determinants of Health   Financial Resource Strain: Not on file  Food Insecurity: No Food Insecurity (12/01/2022)   Hunger Vital Sign    Worried About Running Out of Food in the Last Year: Never true    Ran Out of Food in the Last Year: Never true  Transportation Needs: No Transportation Needs (12/01/2022)   PRAPARE - Administrator, Civil Service (Medical): No    Lack of Transportation (Non-Medical): No  Physical Activity: Not on file  Stress: Not on file   Social Connections: Not on file  Intimate Partner Violence: Not At Risk (12/01/2022)   Humiliation, Afraid, Rape, and Kick questionnaire    Fear of Current or Ex-Partner: No    Emotionally Abused: No    Physically Abused: No    Sexually Abused: No     Family History  Problem Relation Age of Onset   Pancreatic cancer Mother    CAD Father    Diabetes Brother     ROS: Otherwise negative unless mentioned in HPI  Physical Examination  Vitals:   12/14/22 0800 12/14/22 1000  BP: (!) 117/102 (!) 124/96  Pulse: (!) 47   Resp: 20   Temp: 98 F (36.7 C)   SpO2: 99%    Body mass index is 10.73 kg/m.  General:  WDWN in NAD Gait: Not observed HENT: WNL, normocephalic Pulmonary: normal non-labored breathing, without Rales, rhonchi,  wheezing Cardiac: regular, without  Murmurs, rubs or gallops; without carotid bruits Abdomen: positive bowel sounds throughout, soft, NT/ND, no masses Skin: without rashes Vascular Exam/Pulses: Palpable Upper extremity pulses, Unable to palpate bilateral lower extremity pulses due to +2 edema.  Extremities: without ischemic changes, without Gangrene , without cellulitis; without open wounds;  Musculoskeletal: no muscle wasting or atrophy  Neurologic: A&O X 3;  No focal weakness or paresthesias are detected; speech is fluent/normal Psychiatric:  The pt has Normal affect. Lymph:  Unremarkable  CBC    Component Value Date/Time   WBC 8.1 12/14/2022 0729   RBC 3.25 (L) 12/14/2022 0729   HGB 9.9 (L) 12/14/2022 0729   HGB 12.1 (L) 05/16/2019 1623   HCT 31.0 (L) 12/14/2022 0729   HCT 38.8 05/16/2019 1623   PLT 170 12/14/2022 0729   PLT 246 05/16/2019 1623   MCV 95.4 12/14/2022 0729   MCV 88 05/16/2019 1623   MCH 30.5 12/14/2022 0729   MCHC 31.9 12/14/2022 0729   RDW 15.0 12/14/2022 0729   RDW 14.5 05/16/2019 1623   LYMPHSABS 0.4 (L) 12/03/2022 0220   LYMPHSABS 1.5 05/16/2019 1623   MONOABS 0.8 12/03/2022 0220   EOSABS 0.3 12/03/2022 0220    EOSABS 0.4 05/16/2019 1623   BASOSABS 0.0 12/03/2022 0220   BASOSABS 0.0 05/16/2019 1623    BMET    Component Value Date/Time   NA 138 12/14/2022 0729   NA 140 07/16/2020 1217   K 4.5 12/14/2022 0729   CL 101 12/14/2022 0729   CO2 25 12/14/2022 0729   GLUCOSE 132 (H) 12/14/2022 0729   BUN 67 (H) 12/14/2022 0729   BUN 59 (H) 07/16/2020 1217   CREATININE 4.39 (H) 12/14/2022 0729   CALCIUM 8.5 (L) 12/14/2022 0729   GFRNONAA 13 (L) 12/14/2022 0729   GFRAA 33 (L) 10/18/2019 1111    COAGS: Lab Results  Component Value Date   INR 1.2 12/07/2022   INR 1.6 (H) 12/03/2022   INR 1.2 01/28/2021     Non-Invasive Vascular Imaging:   None Ordered.   Statin:  Yes.   Beta Blocker:  No. Aspirin:  No. ACEI:  No. ARB:  No. CCB use:  No Other antiplatelets/anticoagulants:  Yes.   Eliquis 2.5 mg twice daily   ASSESSMENT/PLAN: This is a 82 y.o. male who presents to [facility] ER with Acute exacerbation of CHF and CKD. Vascular Surgery consulted for placement of Dialysis access in Perma Catheter.   Vascular Surgery plans on taking the patient to the vascular lab later today.  I had a detailed discussion with the patient and the patients POA at the bedside this morning. We discussed in detail the procedure, benefits, risks and complications. Both verbalized there understanding and wish to proceed. I answered all of there questions this morning. Patient has been NPO since midnight last night.    -I discussed in detail the plan with Dr Festus Barren MD and he is in agreement with the plan.    Marcie Bal [facility] 12/14/2022 10:43 AM

## 2022-12-15 ENCOUNTER — Encounter: Payer: Self-pay | Admitting: Vascular Surgery

## 2022-12-15 DIAGNOSIS — I5033 Acute on chronic diastolic (congestive) heart failure: Secondary | ICD-10-CM | POA: Diagnosis not present

## 2022-12-15 LAB — RENAL FUNCTION PANEL
Albumin: 3.2 g/dL — ABNORMAL LOW (ref 3.5–5.0)
Anion gap: 11 (ref 5–15)
BUN: 76 mg/dL — ABNORMAL HIGH (ref 8–23)
CO2: 25 mmol/L (ref 22–32)
Calcium: 8.6 mg/dL — ABNORMAL LOW (ref 8.9–10.3)
Chloride: 102 mmol/L (ref 98–111)
Creatinine, Ser: 4.67 mg/dL — ABNORMAL HIGH (ref 0.61–1.24)
GFR, Estimated: 12 mL/min — ABNORMAL LOW (ref >60)
Glucose, Bld: 113 mg/dL — ABNORMAL HIGH (ref 70–99)
Phosphorus: 7.1 mg/dL — ABNORMAL HIGH (ref 2.5–4.6)
Potassium: 4.9 mmol/L (ref 3.5–5.1)
Sodium: 138 mmol/L (ref 135–145)

## 2022-12-15 LAB — CBC
HCT: 30.4 % — ABNORMAL LOW (ref 39.0–52.0)
Hemoglobin: 9.5 g/dL — ABNORMAL LOW (ref 13.0–17.0)
MCH: 29.9 pg (ref 26.0–34.0)
MCHC: 31.3 g/dL (ref 30.0–36.0)
MCV: 95.6 fL (ref 80.0–100.0)
Platelets: 194 10*3/uL (ref 150–400)
RBC: 3.18 MIL/uL — ABNORMAL LOW (ref 4.22–5.81)
RDW: 15.1 % (ref 11.5–15.5)
WBC: 8.1 10*3/uL (ref 4.0–10.5)
nRBC: 0 % (ref 0.0–0.2)

## 2022-12-15 LAB — GLUCOSE, CAPILLARY
Glucose-Capillary: 105 mg/dL — ABNORMAL HIGH (ref 70–99)
Glucose-Capillary: 133 mg/dL — ABNORMAL HIGH (ref 70–99)
Glucose-Capillary: 141 mg/dL — ABNORMAL HIGH (ref 70–99)

## 2022-12-15 LAB — HEPARIN LEVEL (UNFRACTIONATED): Heparin Unfractionated: 0.26 IU/mL — ABNORMAL LOW (ref 0.30–0.70)

## 2022-12-15 MED ORDER — STERILE WATER FOR INJECTION IJ SOLN
INTRAMUSCULAR | Status: AC
  Filled 2022-12-15: qty 10

## 2022-12-15 MED ORDER — FUROSEMIDE 40 MG PO TABS
80.0000 mg | ORAL_TABLET | ORAL | Status: DC
  Administered 2022-12-16 – 2023-01-04 (×9): 80 mg via ORAL
  Filled 2022-12-15 (×10): qty 2

## 2022-12-15 MED ORDER — ALTEPLASE 2 MG IJ SOLR
4.0000 mg | Freq: Once | INTRAMUSCULAR | Status: AC
  Administered 2022-12-15: 4 mg

## 2022-12-15 MED ORDER — HEPARIN SODIUM (PORCINE) 1000 UNIT/ML IJ SOLN: 4200.0000 [IU] | Freq: Once | INTRAMUSCULAR | Status: DC

## 2022-12-15 MED ORDER — ALTEPLASE 2 MG IJ SOLR
INTRAMUSCULAR | Status: AC
  Filled 2022-12-15: qty 4

## 2022-12-15 MED ORDER — APIXABAN 2.5 MG PO TABS
2.5000 mg | ORAL_TABLET | Freq: Two times a day (BID) | ORAL | Status: DC
  Administered 2022-12-15 – 2023-01-04 (×38): 2.5 mg via ORAL
  Filled 2022-12-15 (×39): qty 1

## 2022-12-15 NOTE — Progress Notes (Signed)
PROGRESS NOTE    Kyle Roberson   NGE:[id number] DOB: [birth year]  DOA: 11/26/2022 Date of Service: 12/15/22 which is hospital day 19  PCP: [name], MD   HPI:  [phone] with h/o CAD, stage 5 CKD, chronic diastolic CHF, BLE lymphedema, afib on Eliquis, PAH, and SDH presented on 09/05 with weight gain and SOB.    Hospital course / major events:  He was diagnosed with acute on chronic CHF and started on Lasix drip.  Cardiology was consulted. Echo this admission with EF 55-60%, mild LVH, moderate RV enlargement with mildly decreased RV systolic function, PASP 73 mmHg, severe LAE, mild-moderate TR, IVC dilated.  IV midodrine drip 09/09.   RHC done on 09/11 with elevated R > L filling pressures, mostly right-sided, worsening renal function and decreased UOP so Lasix stopped.  Started on CRRT 09/12 for additional volume removal and completed this. 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  He is on IV Lasix.  Remains on heparin gtt for Afib, has been bradycardic w/ ventricular rates 40-50s but go into 60-70 range on standing. Resumed Eliquis 09/24 Permcath placement 12/14/22 and now pending outpatient placement and HD plan   Consultants:  Cardiology - advanced HF team  Nephrology  PCCM Palliative care Surgery PT/OT [facility] team  Procedures: Echocardiogram 9/6 CVC 9/12 RHC on 9/11 CRRT HD 9/17, 9/18, 09/20, 09/24 Permcath placed 09/23      ASSESSMENT & PLAN:   Acute on chronic diastolic congestive heart failure Pulmonary HTN, severe - group 2 PH (from elevated LVEDP  Presented with volume overload with increasing weight up to 255 pounds.  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 Bradycardic currently Apixaban 2.5 bid on hold. Continue heparin for now per cardiology  No role for PPM currently in light of need for HD and additional nidus for blood stream infection   Anemia of chronic inflammation, cardiorenal disease ESRD Starting Epo  Hyperphosphatemia Binders per nephro  L ankle pain Fell in the ER Xray ordered and negative CAM walker for use with ambulation   Hypertension Continue doxazosin, hydralazine   Hyperlipidemia Continue rosuvastatin, Lovaza   CAD with remote PCI Continue Imdur, statin No ASA   History of paroxysmal a fib Bradycardia currently so not on rate controlling medications Resume eliquis    Goals of care Patient appears disinterested in aggressive care but his wife is insistent She has declined palliative care consultation, "he's not there yet" He has a very poor overall prognosis based on cardiorenal failure and is not a good long-term HD candidate Ongoing discussions are encouraged regarding code status as well as possibly EOL care    Morbid  obesity Complicates overall prognosis Body mass index is 36.5 kg/m.Marland Kitchen  Nutrition Problem: Increased nutrient needs Etiology: acute illness (CRRT) Signs/Symptoms: estimated needs   Pressure ulcer  Pressure Injury 12/03/22 Buttocks Right;Left Stage 1 -  Intact skin with non-blanchable redness of a localized area usually  over a bony prominence. Purple/blue (Active)  12/03/22 0820  Location: Buttocks  Location Orientation: Right;Left  Staging: Stage 1 -  Intact skin with non-blanchable redness of a localized area usually over a bony prominence.  Wound Description (Comments): Purple/blue  Present on Admission:           Morbid obesity based on BMI: Body mass index is 13.51 kg/m.  And serious comorbid medical conditions   DVT prophylaxis: on heparin gtt IV fluids: no continuous IV fluids  Nutrition: low sodium Central lines / invasive devices:  [facility] HD cath   Code Status: FULL CODE ACP documentation reviewed: 12/12/22 none on file in [facility]  TOC needs: TBD. Renal navigator working on HD outpatient  Barriers to dispo / significant pending items: permcath placement early next week             Subjective / Brief ROS:  Patient reports no concerns today, examined in dialysis suite   Family Communication: wil call wife if/when updates, for now she is in communication w/ TOC re: placement     Objective Findings:  Vitals:   12/15/22 1300 12/15/22 1330 12/15/22 1400 12/15/22 1430  BP: (!) 126/58 (!) 126/59 (!) 134/54 (!) 100/57  Pulse: (!) 49 (!) 50 81 (!) 36  Resp: (!) 27 (!) 27 (!) 27 (!) 25  Temp:      TempSrc:      SpO2: 95% 98% 99% 100%  Weight:      Height:        Intake/Output Summary (Last 24 hours) at 12/15/2022 1504 Last data filed at 12/15/2022 0547 Gross per 24 hour  Intake 411.73 ml  Output 450 ml  Net -38.27 ml    Filed Weights   12/11/22 0859 12/11/22 1239 12/15/22 1000  Weight: 30.5 kg 32 kg 40.3 kg    Examination:  Physical Exam Constitutional:      General: He is not in acute distress.    Appearance: He is ill-appearing.  Cardiovascular:     Rate and Rhythm: Normal rate.     Heart sounds: Murmur heard.  Pulmonary:     Effort: Pulmonary effort is normal. No respiratory distress.     Breath sounds: Examination of the right-lower field reveals decreased  breath sounds. Examination of the left-lower field reveals decreased breath sounds. Decreased breath sounds and wheezing present.     Comments: Effort appears easier compared to yesterday  Abdominal:     Palpations: Abdomen is soft.  Skin:    General: Skin is warm and dry.  Neurological:     General: No focal deficit present.     Mental Status: He is alert and oriented to person, place, and time. Mental status is at baseline.  Psychiatric:        Mood and Affect: Mood normal.        Behavior: Behavior normal.          Scheduled Medications:   apixaban  2.5 mg Oral BID   vitamin C  500 mg Oral BID   Chlorhexidine Gluconate Cloth  6 each Topical Daily   cholecalciferol  2,000 Units Oral Daily   docusate sodium  100 mg Oral BID   doxazosin  1 mg  Oral BID   epoetin (EPOGEN/PROCRIT) injection  10,000 Units Intravenous Q M,W,F-HD   feeding supplement (NEPRO CARB STEADY)  237 mL Oral TID BM   [START ON 12/16/2022] furosemide  80 mg Oral Q M,W,F   insulin aspart  0-15 Units Subcutaneous TID AC & HS   insulin aspart  3 Units Subcutaneous TID WC   multivitamin  1 tablet Oral QHS   neomycin-polymyxin b-dexamethasone  1 Application Right Eye TID   omega-3 acid ethyl esters  1 g Oral Daily   rosuvastatin  10 mg Oral Once per day on Monday Wednesday Friday   sodium chloride flush  3 mL Intravenous Q12H    Continuous Infusions:  sodium chloride      PRN Medications:  sodium chloride, acetaminophen **OR** acetaminophen, calamine, diphenhydrAMINE-zinc acetate, fentaNYL (SUBLIMAZE) injection, hydrocortisone cream, hydrOXYzine, ipratropium-albuterol, ondansetron **OR** ondansetron (ZOFRAN) IV, oxyCODONE, polyethylene glycol, sodium chloride flush, traZODone  Antimicrobials from admission:  Anti-infectives (From admission, onward)    Start     Dose/Rate Route Frequency Ordered Stop   12/14/22 1414  ceFAZolin (ANCEF) IVPB 1 g/50 mL premix  Status:  Discontinued        1 g 100 mL/hr over  30 Minutes Intravenous 30 min pre-op 12/14/22 1414 12/14/22 1623           Data Reviewed:  I have personally reviewed the following...  CBC: Recent Labs  Lab 12/11/22 0440 12/12/22 0552 12/13/22 0639 12/14/22 0729 12/15/22 0729  WBC 9.2 8.5 8.4 8.1 8.1  HGB 9.7* 10.1* 10.3* 9.9* 9.5*  HCT 29.9* 31.7* 33.4* 31.0* 30.4*  MCV 92.9 93.2 96.3 95.4 95.6  PLT 162 168 174 170 194   Basic Metabolic Panel: Recent Labs  Lab 12/11/22 0440 12/12/22 0552 12/13/22 0639 12/14/22 0729 12/15/22 0729  NA 136 134* 137 138 138  K 4.5 4.3 4.6 4.5 4.9  CL 99 98 101 101 102  CO2 25 26 26 25 25   GLUCOSE 101* 92 99 132* 113*  BUN 68* 46* 57* 67* 76*  CREATININE 3.67* 3.02* 3.66* 4.39* 4.67*  CALCIUM 8.3* 8.4* 8.6* 8.5* 8.6*  PHOS 6.1* 4.4 5.8* 6.6* 7.1*   GFR: Estimated Creatinine Clearance: 6.8 mL/min (A) (by C-G formula based on SCr of 4.67 mg/dL (H)). Liver Function Tests: Recent Labs  Lab 12/11/22 0440 12/12/22 0552 12/13/22 0639 12/14/22 0729 12/15/22 0729  ALBUMIN 3.2* 3.3* 3.3* 3.3* 3.2*   No results for input(s): "LIPASE", "AMYLASE" in the last 168 hours. No results for input(s): "AMMONIA" in the last 168 hours. Coagulation Profile: No results for input(s): "INR", "PROTIME" in the last 168 hours.  Cardiac Enzymes: No results for input(s): "CKTOTAL", "CKMB", "CKMBINDEX", "TROPONINI" in the last 168 hours. BNP (last 3 results) No results for input(s): "PROBNP" in the last 8760 hours. HbA1C: No results for input(s): "HGBA1C" in the last 72 hours. CBG: Recent Labs  Lab 12/14/22 0832 12/14/22 1214 12/14/22 1836 12/14/22 2202 12/15/22 0759  GLUCAP 117* 104* 129* 170* 105*   Lipid Profile: No results for input(s): "CHOL", "HDL", "LDLCALC", "TRIG", "CHOLHDL", "LDLDIRECT" in the last 72 hours. Thyroid Function Tests: No results for input(s): "TSH", "T4TOTAL", "FREET4", "T3FREE", "THYROIDAB" in the last 72 hours. Anemia Panel: No results for input(s):  "VITAMINB12", "FOLATE", "FERRITIN", "TIBC", "IRON", "RETICCTPCT" in the last 72 hours. Most Recent Urinalysis On File:     Component Value Date/Time   COLORURINE YELLOW (A) 02/04/2021 1358   APPEARANCEUR CLEAR (A) 02/04/2021 1358   LABSPEC 1.014 02/04/2021 1358   PHURINE  5.0 02/04/2021 1358   GLUCOSEU NEGATIVE 02/04/2021 1358   HGBUR NEGATIVE 02/04/2021 1358   BILIRUBINUR NEGATIVE 02/04/2021 1358   BILIRUBINUR neg 01/10/2019 1628   KETONESUR NEGATIVE 02/04/2021 1358   PROTEINUR 100 (A) 02/04/2021 1358   UROBILINOGEN 0.2 01/10/2019 1628   NITRITE NEGATIVE 02/04/2021 1358   LEUKOCYTESUR NEGATIVE 02/04/2021 1358   Sepsis Labs: @LABRCNTIP (procalcitonin:4,lacticidven:4) Microbiology: No results found for this or any previous visit (from the past 240 hour(s)).    Radiology Studies last 3 days: PERIPHERAL VASCULAR CATHETERIZATION  Result Date: 12/14/2022 See surgical note for result.            LOS: 19 days    Time spent: 50 min     Sunnie Nielsen, DO [facility] 12/15/2022, 3:04 PM    Dictation software may have been used to generate the above note. Typos may occur and escape review in typed/dictated notes. Please contact Dr Lyn Hollingshead directly for clarity if needed.  Staff may message me via secure chat in Epic  but this may not receive an immediate response,  please page me for urgent matters!  If 7PM-7AM, please contact night coverage [email or web address]

## 2022-12-15 NOTE — Progress Notes (Signed)
PT Cancellation Note  Patient Details Name: Kyle Roberson MRN: [id number] DOB: Jun 04, 1940   Cancelled Treatment:    Reason Eval/Treat Not Completed: Other (comment). Pt out of room at this time, PT to re-attempt as able.    Olga Coaster PT, DPT 2:24 PM,12/15/22

## 2022-12-15 NOTE — TOC Progression Note (Signed)
Transition of Care [facility]) - Progression Note    Patient Details  Name: Kyle Roberson MRN: [id number] Date of Birth: [birth year]  Transition of Care [facility]) CM/SW Contact  Truddie Hidden, RN Phone Number: 12/15/2022, 2:35 PM  Clinical Narrative:    Attempt to speak with patient and his wife regarding discharge plan. Patient was not in his room.     Expected Discharge Plan: Home/Self Care Barriers to Discharge: No Barriers Identified  Expected Discharge Plan and Services       Living arrangements for the past 2 months: Single Family Home                                       Social Determinants of Health (SDOH) Interventions SDOH Screenings   Food Insecurity: No Food Insecurity (12/01/2022)  Housing: Low Risk  (12/01/2022)  Transportation Needs: No Transportation Needs (12/01/2022)  Utilities: Not At Risk (12/01/2022)  Depression (PHQ2-9): Low Risk  (07/27/2022)  Tobacco Use: Medium Risk (11/26/2022)    Readmission Risk Interventions     No data to display

## 2022-12-15 NOTE — Progress Notes (Signed)
OT Cancellation Note  Patient Details Name: Kyle Roberson MRN: [id number] DOB: 05-21-1940   Cancelled Treatment:    Reason Eval/Treat Not Completed: Fatigue/lethargy limiting ability to participate;Patient at procedure or test/ unavailable. Chart reviewed. Pt refused mobility attempts this AM. Off the unit to HD this PM. Will re-attempt next date as able to follow POC.   Kathie Dike, M.S. OTR/L  12/15/22, 3:27 PM  ascom [phone]

## 2022-12-15 NOTE — Progress Notes (Addendum)
[facility]  ROUNDING NOTE   Subjective:   Patient seen and evaluated during dialysis   HEMODIALYSIS FLOWSHEET:  Blood Flow Rate (mL/min): 400 mL/min Arterial Pressure (mmHg): -220 mmHg Venous Pressure (mmHg): 230 mmHg TMP (mmHg): 13 mmHg Ultrafiltration Rate (mL/min): 1088 mL/min Dialysate Flow Rate (mL/min): 300 ml/min  Increased arterial pressures Will cathflo patient and reattempt treatment  Objective:  Vital signs in last 24 hours:  Temp:  [97.6 F (36.4 C)-98.5 F (36.9 C)] 98.5 F (36.9 C) (09/24 0940) Pulse Rate:  [0-71] 48 (09/24 1015) Resp:  [15-28] 24 (09/24 1015) BP: (126-163)/(44-65) 126/61 (09/24 1015) SpO2:  [80 %-100 %] 97 % (09/24 1015) Weight:  [40.3 kg] 40.3 kg (09/24 1000)  Weight change:  Filed Weights   12/11/22 0859 12/11/22 1239 12/15/22 1000  Weight: 30.5 kg 32 kg 40.3 kg    Intake/Output: I/O last 3 completed shifts: In: 411.7 [P.O.:240; I.V.:171.7] Out: 800 [Urine:800]   Intake/Output this shift:  No intake/output data recorded.  Physical Exam: General: NAD  Head: Normocephalic, atraumatic. Moist oral mucosal membranes  Eyes: Anicteric  Lungs:  Audible wheeze, room air  Heart: bradycardia  Abdomen:  Soft, nontender  Extremities:  ++ peripheral edema.  Neurologic: Alert and oriented, moving all four extremities  Skin: No lesions  Access: None    Basic Metabolic Panel: Recent Labs  Lab 12/11/22 0440 12/12/22 0552 12/13/22 0639 12/14/22 0729 12/15/22 0729  NA 136 134* 137 138 138  K 4.5 4.3 4.6 4.5 4.9  CL 99 98 101 101 102  CO2 25 26 26 25 25   GLUCOSE 101* 92 99 132* 113*  BUN 68* 46* 57* 67* 76*  CREATININE 3.67* 3.02* 3.66* 4.39* 4.67*  CALCIUM 8.3* 8.4* 8.6* 8.5* 8.6*  PHOS 6.1* 4.4 5.8* 6.6* 7.1*    Liver Function Tests: Recent Labs  Lab 12/11/22 0440 12/12/22 0552 12/13/22 0639 12/14/22 0729 12/15/22 0729  ALBUMIN 3.2* 3.3* 3.3* 3.3* 3.2*   No results for input(s): "LIPASE", "AMYLASE" in  the last 168 hours. No results for input(s): "AMMONIA" in the last 168 hours.  CBC: Recent Labs  Lab 12/11/22 0440 12/12/22 0552 12/13/22 0639 12/14/22 0729 12/15/22 0729  WBC 9.2 8.5 8.4 8.1 8.1  HGB 9.7* 10.1* 10.3* 9.9* 9.5*  HCT 29.9* 31.7* 33.4* 31.0* 30.4*  MCV 92.9 93.2 96.3 95.4 95.6  PLT 162 168 174 170 194    Cardiac Enzymes: No results for input(s): "CKTOTAL", "CKMB", "CKMBINDEX", "TROPONINI" in the last 168 hours.  BNP: Invalid input(s): "POCBNP"  CBG: Recent Labs  Lab 12/14/22 0832 12/14/22 1214 12/14/22 1836 12/14/22 2202 12/15/22 0759  GLUCAP 117* 104* 129* 170* 105*    Microbiology: Results for orders placed or performed during the hospital encounter of 11/26/22  MRSA Next Gen by PCR, Nasal     Status: None   Collection Time: 11/30/22  4:10 PM   Specimen: Nasal Mucosa; Nasal Swab  Result Value Ref Range Status   MRSA by PCR Next Gen NOT DETECTED NOT DETECTED Final    Comment: (NOTE) The GeneXpert MRSA Assay (FDA approved for NASAL specimens only), is one component of a comprehensive MRSA colonization surveillance program. It is not intended to diagnose MRSA infection nor to guide or monitor treatment for MRSA infections. Test performance is not FDA approved in patients less than 32 years old. Performed at [facility], [address] [place], [place] [address]     Coagulation Studies: No results for input(s): "LABPROT", "INR" in the last 72  hours.   Urinalysis: No results for input(s): "COLORURINE", "LABSPEC", "PHURINE", "GLUCOSEU", "HGBUR", "BILIRUBINUR", "KETONESUR", "PROTEINUR", "UROBILINOGEN", "NITRITE", "LEUKOCYTESUR" in the last 72 hours.  Invalid input(s): "APPERANCEUR"    Imaging: PERIPHERAL VASCULAR CATHETERIZATION  Result Date: 12/14/2022 See surgical note for result.    Medications:    sodium chloride     heparin 1,400 Units/hr (12/15/22 0618)    vitamin C  500 mg Oral BID   Chlorhexidine Gluconate Cloth   6 each Topical Daily   cholecalciferol  2,000 Units Oral Daily   docusate sodium  100 mg Oral BID   doxazosin  1 mg Oral BID   epoetin (EPOGEN/PROCRIT) injection  10,000 Units Intravenous Q M,W,F-HD   feeding supplement (NEPRO CARB STEADY)  237 mL Oral TID BM   insulin aspart  0-15 Units Subcutaneous TID AC & HS   insulin aspart  3 Units Subcutaneous TID WC   multivitamin  1 tablet Oral QHS   neomycin-polymyxin b-dexamethasone  1 Application Right Eye TID   omega-3 acid ethyl esters  1 g Oral Daily   rosuvastatin  10 mg Oral Once per day on Monday Wednesday Friday   sodium chloride flush  3 mL Intravenous Q12H   sodium chloride, acetaminophen **OR** acetaminophen, calamine, diphenhydrAMINE-zinc acetate, fentaNYL (SUBLIMAZE) injection, hydrocortisone cream, hydrOXYzine, ipratropium-albuterol, ondansetron **OR** ondansetron (ZOFRAN) IV, oxyCODONE, polyethylene glycol, sodium chloride flush, traZODone  Assessment/ Plan:  [name] is a 82 y.o.  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 Due to progression of kidney disease and outpatient diuretic therapy failure, we feel this patient has progressed to end stage renal disease.  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 CRRT from 9/12 to 9/13 -Patient receiving treatment today, UF goal 2.5 L as tolerated - Increased arterial pressures, not resolved with line reversal. - Patient will receive 1 hour Cathflo dwell and reattempt treatment. -Next treatment scheduled for Thursday. - Will order oral furosemide 80mg  daily on nonHD days  Acute exacerbation of chronic diastolic  congestive heart failure: complicated with acute respiratory failure and pulmonary hypertension. Currently with junctional rhythm. - Room air  -Will continue fluid management with dialysis and diuresis.  Diabetes mellitus type II with chronic kidney disease - holding empagliflozin    LOS: 19 Kyle Roberson 9/24/202410:45 AM

## 2022-12-15 NOTE — Progress Notes (Signed)
Patient returned from dialysis.

## 2022-12-15 NOTE — Progress Notes (Signed)
  Received patient in bed to unit.   Informed consent signed and in chart.    TX duration: 3.5 hrs     Hand-off given to patient's nurse.    Access used: L CVC IJ Access issues: High AP to start, had to use cathflo x 1 hr   Total UF removed: 3000 mL Medication(s) given: None  Post HD VS: WDL x bradycardia Post HD weight: 37.3 kg Pt's CVC luer locks were extremely difficult to twist off, even before tx. After tx, a piece of the dialysis luer lock broke off at the end of the pts arterial lumen,  Making it unable to place a cap on it. Clamped the lumen and wrapped it with gauze. Dr. Thedore Mins was notified, said he would contact vascular to see the patient.     [facility] Dialysis Unit

## 2022-12-16 ENCOUNTER — Encounter
Admission: EM | Disposition: A | Discharge status: Skilled Nursing Facility | Payer: Self-pay | Source: Home / Self Care | Attending: Internal Medicine

## 2022-12-16 DIAGNOSIS — N186 End stage renal disease: Secondary | ICD-10-CM | POA: Diagnosis not present

## 2022-12-16 DIAGNOSIS — I5033 Acute on chronic diastolic (congestive) heart failure: Secondary | ICD-10-CM | POA: Diagnosis not present

## 2022-12-16 DIAGNOSIS — Z992 Dependence on renal dialysis: Secondary | ICD-10-CM | POA: Diagnosis not present

## 2022-12-16 HISTORY — PX: DIALYSIS/PERMA CATHETER REPAIR: CATH118293

## 2022-12-16 LAB — GLUCOSE, CAPILLARY
Glucose-Capillary: 90 mg/dL (ref 70–99)
Glucose-Capillary: 88 mg/dL (ref 70–99)
Glucose-Capillary: 103 mg/dL — ABNORMAL HIGH (ref 70–99)
Glucose-Capillary: 148 mg/dL — ABNORMAL HIGH (ref 70–99)

## 2022-12-16 LAB — RENAL FUNCTION PANEL
Albumin: 3.1 g/dL — ABNORMAL LOW (ref 3.5–5.0)
Anion gap: 9 (ref 5–15)
BUN: 43 mg/dL — ABNORMAL HIGH (ref 8–23)
CO2: 26 mmol/L (ref 22–32)
Calcium: 8.1 mg/dL — ABNORMAL LOW (ref 8.9–10.3)
Chloride: 100 mmol/L (ref 98–111)
Creatinine, Ser: 2.96 mg/dL — ABNORMAL HIGH (ref 0.61–1.24)
GFR, Estimated: 20 mL/min — ABNORMAL LOW (ref >60)
Glucose, Bld: 89 mg/dL (ref 70–99)
Phosphorus: 4.5 mg/dL (ref 2.5–4.6)
Potassium: 4 mmol/L (ref 3.5–5.1)
Sodium: 135 mmol/L (ref 135–145)

## 2022-12-16 SURGERY — DIALYSIS/PERMA CATHETER REPAIR
Anesthesia: Moderate Sedation

## 2022-12-16 MED ORDER — HEPARIN (PORCINE) IN NACL 1000-0.9 UT/500ML-% IV SOLN
INTRAVENOUS | Status: DC | PRN
  Administered 2022-12-16: 500 mL

## 2022-12-16 MED ORDER — CEFAZOLIN SODIUM-DEXTROSE 1-4 GM/50ML-% IV SOLN
1.0000 g | INTRAVENOUS | Status: DC
  Filled 2022-12-16: qty 50

## 2022-12-16 MED ORDER — MIDAZOLAM HCL 2 MG/ML PO SYRP: 8.0000 mg | ORAL_SOLUTION | Freq: Once | ORAL | Status: DC | PRN

## 2022-12-16 MED ORDER — HEPARIN SODIUM (PORCINE) 1000 UNIT/ML IJ SOLN
INTRAMUSCULAR | Status: DC | PRN
  Administered 2022-12-16: 5000 [IU] via INTRAVENOUS

## 2022-12-16 MED ORDER — CLOTRIMAZOLE-BETAMETHASONE 1-0.05 % EX CREA
TOPICAL_CREAM | Freq: Two times a day (BID) | CUTANEOUS | Status: DC
  Administered 2022-12-21 – 2022-12-22 (×2): 1 via TOPICAL
  Filled 2022-12-16 (×9): qty 15

## 2022-12-16 MED ORDER — CEFAZOLIN SODIUM-DEXTROSE 1-4 GM/50ML-% IV SOLN
INTRAVENOUS | Status: AC | PRN
  Administered 2022-12-16: 1 g via INTRAVENOUS

## 2022-12-16 MED ORDER — SODIUM CHLORIDE 0.9 % IV SOLN: INTRAVENOUS | Status: DC

## 2022-12-16 MED ORDER — MIDAZOLAM HCL 2 MG/2ML IJ SOLN
INTRAMUSCULAR | Status: AC
  Filled 2022-12-16: qty 2

## 2022-12-16 MED ORDER — FENTANYL CITRATE (PF) 100 MCG/2ML IJ SOLN
INTRAMUSCULAR | Status: DC | PRN
  Administered 2022-12-16: 50 ug via INTRAVENOUS

## 2022-12-16 MED ORDER — HEPARIN SODIUM (PORCINE) 10000 UNIT/ML IJ SOLN
INTRAMUSCULAR | Status: AC
  Filled 2022-12-16: qty 1

## 2022-12-16 MED ORDER — CEFAZOLIN SODIUM-DEXTROSE 1-4 GM/50ML-% IV SOLN
INTRAVENOUS | Status: AC
  Filled 2022-12-16: qty 50

## 2022-12-16 MED ORDER — LIDOCAINE-EPINEPHRINE (PF) 1 %-1:200000 IJ SOLN
INTRAMUSCULAR | Status: DC | PRN
  Administered 2022-12-16: 20 mL via INTRADERMAL

## 2022-12-16 MED ORDER — HYDROMORPHONE HCL 1 MG/ML IJ SOLN: 1.0000 mg | Freq: Once | INTRAMUSCULAR | Status: DC | PRN

## 2022-12-16 MED ORDER — FENTANYL CITRATE PF 50 MCG/ML IJ SOSY
PREFILLED_SYRINGE | INTRAMUSCULAR | Status: AC
  Filled 2022-12-16: qty 1

## 2022-12-16 MED ORDER — FENTANYL CITRATE PF 50 MCG/ML IJ SOSY: 12.5000 ug | PREFILLED_SYRINGE | Freq: Once | INTRAMUSCULAR | Status: DC | PRN

## 2022-12-16 MED ORDER — OXYCODONE HCL 5 MG PO TABS
ORAL_TABLET | ORAL | Status: AC
  Filled 2022-12-16: qty 1

## 2022-12-16 MED ORDER — ONDANSETRON HCL 4 MG/2ML IJ SOLN: 4.0000 mg | Freq: Four times a day (QID) | INTRAMUSCULAR | Status: DC | PRN

## 2022-12-16 MED ORDER — METHYLPREDNISOLONE SODIUM SUCC 125 MG IJ SOLR: 125.0000 mg | Freq: Once | INTRAMUSCULAR | Status: DC | PRN

## 2022-12-16 MED ORDER — MIDAZOLAM HCL 2 MG/2ML IJ SOLN
INTRAMUSCULAR | Status: DC | PRN
  Administered 2022-12-16: 1 mg via INTRAVENOUS

## 2022-12-16 MED ORDER — FAMOTIDINE 20 MG PO TABS: 40.0000 mg | ORAL_TABLET | Freq: Once | ORAL | Status: DC | PRN

## 2022-12-16 MED ORDER — DIPHENHYDRAMINE HCL 50 MG/ML IJ SOLN: 50.0000 mg | Freq: Once | INTRAMUSCULAR | Status: DC | PRN

## 2022-12-16 SURGICAL SUPPLY — 5 items
CATH PALINDROME-P 19CM W/VT (CATHETERS) IMPLANT
GUIDEWIRE SUPER STIFF .035X180 (WIRE) IMPLANT
PACK ANGIOGRAPHY (CUSTOM PROCEDURE TRAY) IMPLANT
SUT MNCRL AB 4-0 PS2 18 (SUTURE) IMPLANT
SUT PROLENE 0 CT 1 30 (SUTURE) IMPLANT

## 2022-12-16 NOTE — Op Note (Signed)
OPERATIVE NOTE    PRE-OPERATIVE DIAGNOSIS: 1. ESRD 2. Non-functional permcath  POST-OPERATIVE DIAGNOSIS: same as above  PROCEDURE: Fluoroscopic guidance for placement of catheter Placement of a 19 cm tip to cuff tunneled hemodialysis catheter via the right internal jugular vein and removal of previous catheter  SURGEON: Festus Barren, MD  ANESTHESIA:  Local with moderate conscious sedation for 17 minutes using 1 mg of Versed and 50 mcg of Fentanyl  ESTIMATED BLOOD LOSS: 10 cc  FINDING(S): none  SPECIMEN(S):  None  INDICATIONS:   Patient is a 82 y.o.male who presents with non-functional dialysis catheter and ESRD.  This was placed earlier this week, but with dialysis treatment yesterday one of the plastic connectors became stuck in the end broke off inside of the new catheter making this unusable.  The patient needs long term dialysis access for their ESRD, and a Permcath is necessary.  Risks and benefits are discussed and informed consent is obtained.    DESCRIPTION: After obtaining full informed written consent, the patient was brought back to the vascular suite. The patient received moderate conscious sedation during a face-to-face encounter with me present throughout the entire procedure and supervising the RN monitoring the vital signs, pulse oximetry, telemetry, and mental status throughout the entire procedure. The patient's existing catheter, right neck and chest were sterilely prepped and draped in a sterile surgical field was created.  The existing catheter was dissected free from the fibrous sheath securing the cuff with hemostats and blunt dissection.  A wire was placed. The existing catheter was then removed and the wire used to keep venous access. I selected a 19 cm tip to cuff tunneled dialysis catheter.  Using fluoroscopic guidance the catheter tips were parked in the right atrium. The appropriate distal connectors were placed. It withdrew blood well and flushed easily with  heparinized saline and a concentrated heparin solution was then placed. It was secured to the chest wall with 2 Prolene sutures. A 4-0 Monocryl pursestring suture was placed around the exit site. Sterile dressings were placed. The patient tolerated the procedure well and was taken to the recovery room in stable condition.  COMPLICATIONS: None  CONDITION: Stable  Festus Barren 12/16/2022 3:05 PM   This note was created with Dragon Medical transcription system. Any errors in dictation are purely unintentional.

## 2022-12-16 NOTE — TOC Progression Note (Signed)
Transition of Care [facility]) - Progression Note    Patient Details  Name: Kyle Roberson MRN: [id number] Date of Birth: [birth year]  Transition of Care [facility]) CM/SW Contact  Truddie Hidden, RN Phone Number: 12/16/2022, 4:06 PM  Clinical Narrative:    Sherron Monday with Charlcie Cradle from from [facility]. He stated he had been in contact with patient's wife regarding SNF. Bed offer was made via hub. He was advised RNCM would follow up with patient and his wife.   RNCM spoke with patient's wife regarding SNF. She is interested in [facility]. Patient's wife inquired about [facility] and [address]. She was advised patient's level of care is higher than an ALF. She was provided bed offers for [facility] and [facility].  Patient's wife refused WOM. She plans to tour [facility].    Expected Discharge Plan: Home/Self Care Barriers to Discharge: No Barriers Identified  Expected Discharge Plan and Services       Living arrangements for the past 2 months: Single Family Home                                       Social Determinants of Health (SDOH) Interventions SDOH Screenings   Food Insecurity: No Food Insecurity (12/01/2022)  Housing: Low Risk  (12/01/2022)  Transportation Needs: No Transportation Needs (12/01/2022)  Utilities: Not At Risk (12/01/2022)  Depression (PHQ2-9): Low Risk  (07/27/2022)  Tobacco Use: Medium Risk (11/26/2022)    Readmission Risk Interventions     No data to display

## 2022-12-16 NOTE — Progress Notes (Signed)
Nutrition Follow-up  DOCUMENTATION CODES:   Obesity unspecified  INTERVENTION:   -Continue Nepro Shake po TID, each supplement provides 425 kcal and 19 grams protein -Continue Magic cup TID with meals, each supplement provides 290 kcal and 9 grams of protein -Continue Rena-vit po daily  -Continue Vitamin C 500 mg po BID -Continue daily weights  -Continue 2 gram sodium diet -RD provided "Nutrition for Dialysis" handout from [facility]"; attached to AVS/ discharge summary. Pt will also have access to RD at outpatient HD center for further reinforcement   NUTRITION DIAGNOSIS:   Increased nutrient needs related to acute illness (CRRT) as evidenced by estimated needs.  Ongoing  GOAL:   Patient will meet greater than or equal to 90% of their needs  Progressing   MONITOR:   PO intake, Supplement acceptance, Labs, Weight trends, I & O's, Skin  REASON FOR ASSESSMENT:   Consult Assessment of nutrition requirement/status  ASSESSMENT:   82 y/o male with h/o CHF, DM, CKD IV, HTN, CAD, BPH, GERD, SDH s/p craniotomy, hernia s/p repair, Afib, lymphaedema, HLD, depression and pulmonary hypertension who is admitted with AKI and volume overload requiring CRRT.  9/12- CRRT initiated 9/14- CRRT d/c 9/16- iHD held due to signs of renal recovery 9/17- iHD 9/23- permacath placement 9/25- s/p dialysis/permacath repair  Reviewed I/O's: -3.1 L x 24 hours and -13.3 L x 24 hours   Per vascular notes, pt HD catheter broke during HD yesterday. Plan to replace today.   Attempted to speak with pt x 2, however, unavailable at times of both visits.   Pt NPO for procedure today. Pt currently on a renal diet. Noted meal completions 50-100%.   Noted wt discrepancies; RD will recheck wt.   Medications reviewed and include vitamin C, vitamin D3, colace, lasix, lovasa, and 0.9% sodium chloride infusion @ 10 ml/hr.   Labs reviewed: CBGS: 88-141 (inpatient orders for  glycemic control are 0-15 units insulin 0-15 units insulin aspart TID and at bedtime, and 3 units insulin aspart TID with meals).    Diet Order:   Diet Order             Diet NPO time specified Except for: Sips with Meds  Diet effective now                   EDUCATION NEEDS:   Education needs have been addressed  Skin:  Skin Assessment: Skin Integrity Issues: Skin Integrity Issues:: Stage I Stage I: rt and lt buttocks  Last BM:  12/09/22  Height:   Ht Readings from Last 1 Encounters:  11/26/22 5\' 8"  (1.727 m)    Weight:   Wt Readings from Last 1 Encounters:  12/15/22 37.3 kg    Ideal Body Weight:  70 kg  BMI:  Body mass index is 12.5 kg/m.  Estimated Nutritional Needs:   Kcal:  2000-2300kcal/day  Protein:  100-115g/day  Fluid:  UOP +1L    Levada Schilling, RD, LDN, CDCES Registered Dietitian III Certified Diabetes Care and Education Specialist Please refer to AMION for RD and/or RD on-call/weekend/after hours pager

## 2022-12-16 NOTE — Progress Notes (Signed)
Patients dialysis catheter was broken by nurse during dialysis yesterday. Patient will need to go back to the vascular lab for a dialysis Perma Catheter exchange.

## 2022-12-16 NOTE — Progress Notes (Signed)
[facility]  ROUNDING NOTE   Subjective:   Patient seen sitting up in bed Npo for permcath exchange Remains on room air   Objective:  Vital signs in last 24 hours:  Temp:  [98 F (36.7 C)-98.4 F (36.9 C)] 98 F (36.7 C) (09/25 0508) Pulse Rate:  [28-81] 47 (09/25 0508) Resp:  [18-31] 18 (09/25 0508) BP: (100-151)/(45-71) 129/45 (09/25 0508) SpO2:  [94 %-100 %] 99 % (09/25 0508) Weight:  [37.3 kg] 37.3 kg (09/24 1849)  Weight change:  Filed Weights   12/11/22 1239 12/15/22 1000 12/15/22 1849  Weight: 32 kg 40.3 kg 37.3 kg    Intake/Output: I/O last 3 completed shifts: In: 531.7 [P.O.:360; I.V.:171.7] Out: 3650 [Urine:650; Other:3000]   Intake/Output this shift:  Total I/O In: 100 [P.O.:100] Out: -   Physical Exam: General: NAD  Head: Normocephalic, atraumatic. Moist oral mucosal membranes  Eyes: Anicteric, ectropion  Lungs:  Diminished, room air  Heart: bradycardia  Abdomen:  Soft, nontender  Extremities:  + peripheral edema.  Neurologic: Alert and oriented, moving all four extremities  Skin: No lesions  Access: Rt Chest permcath    Basic Metabolic Panel: Recent Labs  Lab 12/12/22 0552 12/13/22 0639 12/14/22 0729 12/15/22 0729 12/16/22 0337  NA 134* 137 138 138 135  K 4.3 4.6 4.5 4.9 4.0  CL 98 101 101 102 100  CO2 26 26 25 25 26   GLUCOSE 92 99 132* 113* 89  BUN 46* 57* 67* 76* 43*  CREATININE 3.02* 3.66* 4.39* 4.67* 2.96*  CALCIUM 8.4* 8.6* 8.5* 8.6* 8.1*  PHOS 4.4 5.8* 6.6* 7.1* 4.5    Liver Function Tests: Recent Labs  Lab 12/12/22 0552 12/13/22 0639 12/14/22 0729 12/15/22 0729 12/16/22 0337  ALBUMIN 3.3* 3.3* 3.3* 3.2* 3.1*   No results for input(s): "LIPASE", "AMYLASE" in the last 168 hours. No results for input(s): "AMMONIA" in the last 168 hours.  CBC: Recent Labs  Lab 12/11/22 0440 12/12/22 0552 12/13/22 0639 12/14/22 0729 12/15/22 0729  WBC 9.2 8.5 8.4 8.1 8.1  HGB 9.7* 10.1* 10.3* 9.9* 9.5*  HCT 29.9*  31.7* 33.4* 31.0* 30.4*  MCV 92.9 93.2 96.3 95.4 95.6  PLT 162 168 174 170 194    Cardiac Enzymes: No results for input(s): "CKTOTAL", "CKMB", "CKMBINDEX", "TROPONINI" in the last 168 hours.  BNP: Invalid input(s): "POCBNP"  CBG: Recent Labs  Lab 12/14/22 2202 12/15/22 0759 12/15/22 1710 12/15/22 2123 12/16/22 0823  GLUCAP 170* 105* 133* 141* 90    Microbiology: Results for orders placed or performed during the hospital encounter of 11/26/22  MRSA Next Gen by PCR, Nasal     Status: None   Collection Time: 11/30/22  4:10 PM   Specimen: Nasal Mucosa; Nasal Swab  Result Value Ref Range Status   MRSA by PCR Next Gen NOT DETECTED NOT DETECTED Final    Comment: (NOTE) The GeneXpert MRSA Assay (FDA approved for NASAL specimens only), is one component of a comprehensive MRSA colonization surveillance program. It is not intended to diagnose MRSA infection nor to guide or monitor treatment for MRSA infections. Test performance is not FDA approved in patients less than 30 years old. Performed at [facility], [address] [place], [place] [address]     Coagulation Studies: No results for input(s): "LABPROT", "INR" in the last 72 hours.   Urinalysis: No results for input(s): "COLORURINE", "LABSPEC", "PHURINE", "GLUCOSEU", "HGBUR", "BILIRUBINUR", "KETONESUR", "PROTEINUR", "UROBILINOGEN", "NITRITE", "LEUKOCYTESUR" in the last 72 hours.  Invalid input(s): "APPERANCEUR"  Imaging: PERIPHERAL VASCULAR CATHETERIZATION  Result Date: 12/14/2022 See surgical note for result.    Medications:    sodium chloride      apixaban  2.5 mg Oral BID   vitamin C  500 mg Oral BID   Chlorhexidine Gluconate Cloth  6 each Topical Daily   cholecalciferol  2,000 Units Oral Daily   docusate sodium  100 mg Oral BID   doxazosin  1 mg Oral BID   epoetin (EPOGEN/PROCRIT) injection  10,000 Units Intravenous Q M,W,F-HD   feeding supplement (NEPRO CARB STEADY)  237 mL Oral TID BM    furosemide  80 mg Oral Q M,W,F   heparin sodium (porcine)  4,200 Units Intracatheter Once   insulin aspart  0-15 Units Subcutaneous TID AC & HS   insulin aspart  3 Units Subcutaneous TID WC   multivitamin  1 tablet Oral QHS   neomycin-polymyxin b-dexamethasone  1 Application Right Eye TID   omega-3 acid ethyl esters  1 g Oral Daily   rosuvastatin  10 mg Oral Once per day on Monday Wednesday Friday   sodium chloride flush  3 mL Intravenous Q12H   sodium chloride, acetaminophen **OR** acetaminophen, calamine, diphenhydrAMINE-zinc acetate, fentaNYL (SUBLIMAZE) injection, hydrocortisone cream, hydrOXYzine, ipratropium-albuterol, ondansetron **OR** ondansetron (ZOFRAN) IV, oxyCODONE, polyethylene glycol, sodium chloride flush, traZODone  Assessment/ Plan:  [name] is a 82 y.o.  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 Due to progression of kidney disease and outpatient diuretic therapy failure, we feel this patient has progressed to end stage renal disease.  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 CRRT from 9/12 to 9/13 -Patient received dialysis yesterday, UF 3L achieved.  - Permcath sluggish early during treatment, functioned well after 1 hour cathflo - Arterial lumen damaged after treatment, will be exchanged today by vascular.  -Next treatment scheduled for Thursday. - Continue oral furosemide 80mg  daily on nonHD days  Acute exacerbation of chronic diastolic congestive heart failure: complicated with acute respiratory failure and pulmonary hypertension. Currently with junctional rhythm. - Room air  -Will  continue fluid management with dialysis and diuresis.  Diabetes mellitus type II with chronic kidney disease - holding empagliflozin - Glucose well controlled   LOS: 20 Kyle Roberson 9/25/202410:20 AM

## 2022-12-16 NOTE — Progress Notes (Addendum)
Occupational Therapy Re-Evaluation Patient Details Name: Kyle Roberson MRN: [id number] DOB: [birth year] Today's Date: 12/16/2022   History of present illness Kyle Roberson is an 81yoM who comes to [facility] from OP nephrology on 9/5 due to LEE and ABD swelling. PMH: CKD, CHF. As of 9/9 he was transferred to ICU for IV milrinone gtt per [facility] heart failure MD.   OT comments  Kyle Roberson was seen for OT/PT co-treatment on this date. Upon arrival to room pt reclined in bed, agreeable to tx. Pt requires MOD A x2 sup>sit, fair static sitting balance. MOD-MAX A x2 + RW sit<>stand x4 trials, assist for L lateral and posterior lean. Pt making progress toward goals, will continue to follow POC. Discharge recommendation remains appropriate.        If plan is discharge home, recommend the following:  A lot of help with bathing/dressing/bathroom;Assistance with cooking/housework;Assist for transportation;Help with stairs or ramp for entrance;Two people to help with walking and/or transfers   Equipment Recommendations  BSC/3in1    Recommendations for Other Services      Precautions / Restrictions Precautions Precautions: Fall Restrictions Weight Bearing Restrictions: No       Mobility Bed Mobility Overal bed mobility: Needs Assistance Bed Mobility: Supine to Sit, Sit to Supine     Supine to sit: Mod assist, +2 for physical assistance Sit to supine: Max assist, +2 for physical assistance        Transfers Overall transfer level: Needs assistance Equipment used: Rolling walker (2 wheels) Transfers: Sit to/from Stand Sit to Stand: Mod assist, +2 physical assistance, From elevated surface           General transfer comment: x4 sit<>stand     Balance Overall balance assessment: Needs assistance Sitting-balance support: Feet supported Sitting balance-Leahy Scale: Fair     Standing balance support: Bilateral upper extremity supported Standing balance-Leahy Scale: Poor                              ADL either performed or assessed with clinical judgement   ADL Overall ADL's : Needs assistance/impaired                                       General ADL Comments: MAX A for LB access in sitting. MAX A x2 + RW for simulated BSC t/f      Cognition Arousal: Alert Behavior During Therapy: WFL for tasks assessed/performed Overall Cognitive Status: Within Functional Limits for tasks assessed                                                     Pertinent Vitals/ Pain       Pain Assessment Pain Assessment: Faces Faces Pain Scale: Hurts little more Pain Location: RUE with AROM Pain Descriptors / Indicators: Discomfort, Grimacing Pain Intervention(s): Limited activity within patient's tolerance, Repositioned   Frequency  Min 1X/week        Progress Toward Goals  OT Goals(current goals can now be found in the care plan section)  Progress towards OT goals: Progressing toward goals  Acute Rehab OT Goals Patient Stated Goal: go home OT Goal Formulation: With patient/family Time For Goal Achievement: 12/30/22 Potential to Achieve Goals: Good ADL  Goals Pt Will Perform Lower Body Dressing: with caregiver independent in assisting;sit to/from stand Pt Will Transfer to Toilet: ambulating;bedside commode;with supervision Pt Will Perform Toileting - Clothing Manipulation and hygiene: with caregiver independent in assisting Additional ADL Goal #1: Pt will verbalize plan to implement at least 2 learned ECS/falls prevention strategies to maximize safety/indep with ADL and mobility.  Plan      Co-evaluation    PT/OT/SLP Co-Evaluation/Treatment: Yes Reason for Co-Treatment: Complexity of the patient's impairments (multi-system involvement);To address functional/ADL transfers PT goals addressed during session: Mobility/safety with mobility OT goals addressed during session: ADL's and self-care      AM-PAC OT "6 Clicks"  Daily Activity     Outcome Measure   Help from another person eating meals?: None Help from another person taking care of personal grooming?: A Little Help from another person toileting, which includes using toliet, bedpan, or urinal?: A Lot Help from another person bathing (including washing, rinsing, drying)?: A Lot Help from another person to put on and taking off regular upper body clothing?: A Little Help from another person to put on and taking off regular lower body clothing?: A Lot 6 Click Score: 16    End of Session Equipment Utilized During Treatment: Rolling walker (2 wheels);Oxygen;Gait belt  OT Visit Diagnosis: Other abnormalities of gait and mobility (R26.89);Muscle weakness (generalized) (M62.81)   Activity Tolerance Patient tolerated treatment well   Patient Left in bed;with call bell/phone within reach;with family/visitor present   Nurse Communication          Time: 0865-7846 OT Time Calculation (min): 23 min  Charges: OT General Charges $OT Visit: 1 Visit OT Evaluation $OT Re-eval: 1 Re-eval OT Treatments $Self Care/Home Management : 8-22 mins  Kathie Dike, M.S. OTR/L  12/16/22, 2:43 PM  ascom [phone]

## 2022-12-16 NOTE — Progress Notes (Signed)
Physical Therapy Re-Evaluation Patient Details Name: Kyle Roberson MRN: [id number] DOB: Jan 17, 1941 Today's Date: 12/16/2022   History of Present Illness Noahjames Bayliss is an 81yoM who comes to [facility] from OP nephrology on 9/5 due to LEE and ABD swelling. PMH: CKD, CHF. As of 9/9 he was transferred to ICU for IV milrinone gtt per [facility] heart failure MD. Started CRRT this admission and transitioned to HD.    PT Comments  Patient alert, agreeable to PT, family at bedside. Seen as a PT/OT re-evaluation to maximize function, safety, and activity tolerance. Goals updated as appropriate. He continued to need 2 person assist for all mobility tasks. ModAx2 to sit EOB. Sit <> stand with RW and modAx2 with stabilization of walker as well. Pt challenged with RUE and LLE pain. Able to step posteriorly to adjust feet during two transfers, true ambulation deferred due to pt weakness. Returned to supine maxAx2.  The pt demonstrated deficits that impede the patient's functional abilities, safety, and mobility and would benefit from skilled PT intervention to return to PLOF.      If plan is discharge home, recommend the following: Assist for transportation;Help with stairs or ramp for entrance;Assistance with cooking/housework;Two people to help with walking and/or transfers;Two people to help with bathing/dressing/bathroom   Can travel by private vehicle     No  Equipment Recommendations  None recommended by PT    Recommendations for Other Services       Precautions / Restrictions Precautions Precautions: Fall Restrictions Weight Bearing Restrictions: No     Mobility  Bed Mobility Overal bed mobility: Needs Assistance Bed Mobility: Supine to Sit, Sit to Supine     Supine to sit: Mod assist, +2 for physical assistance Sit to supine: Max assist, +2 for physical assistance        Transfers Overall transfer level: Needs assistance Equipment used: Rolling walker (2 wheels) Transfers: Sit  to/from Stand Sit to Stand: Mod assist, +2 physical assistance, From elevated surface           General transfer comment: x4 sit<>stand    Ambulation/Gait                   Stairs             Wheelchair Mobility     Tilt Bed    Modified Rankin (Stroke Patients Only)       Balance Overall balance assessment: Needs assistance Sitting-balance support: Feet supported Sitting balance-Leahy Scale: Fair     Standing balance support: Bilateral upper extremity supported Standing balance-Leahy Scale: Poor                              Cognition Arousal: Alert Behavior During Therapy: WFL for tasks assessed/performed Overall Cognitive Status: Within Functional Limits for tasks assessed                                          Exercises General Exercises - Lower Extremity Heel Slides: AROM, Both, 10 reps, Supine    General Comments        Pertinent Vitals/Pain Pain Assessment Pain Assessment: Faces Faces Pain Scale: Hurts little more Pain Location: RUE with AROM, buttocks with bed mobility Pain Descriptors / Indicators: Discomfort, Grimacing Pain Intervention(s): Limited activity within patient's tolerance, Repositioned, Monitored during session    Home Living  Prior Function            PT Goals (current goals can now be found in the care plan section) Acute Rehab PT Goals Patient Stated Goal: to get better PT Goal Formulation: With patient/family Time For Goal Achievement: 12/30/22 Potential to Achieve Goals: Fair Progress towards PT goals: Progressing toward goals;Goals updated    Frequency    Min 1X/week      PT Plan      Co-evaluation PT/OT/SLP Co-Evaluation/Treatment: Yes Reason for Co-Treatment: Complexity of the patient's impairments (multi-system involvement);To address functional/ADL transfers PT goals addressed during session: Mobility/safety with  mobility;Proper use of DME;Balance OT goals addressed during session: ADL's and self-care;Proper use of Adaptive equipment and DME      AM-PAC PT "6 Clicks" Mobility   Outcome Measure  Help needed turning from your back to your side while in a flat bed without using bedrails?: A Lot Help needed moving from lying on your back to sitting on the side of a flat bed without using bedrails?: A Lot Help needed moving to and from a bed to a chair (including a wheelchair)?: A Lot Help needed standing up from a chair using your arms (e.g., wheelchair or bedside chair)?: A Lot Help needed to walk in hospital room?: Total Help needed climbing 3-5 steps with a railing? : Total 6 Click Score: 10    End of Session Equipment Utilized During Treatment: Gait belt Activity Tolerance: Patient tolerated treatment well Patient left: in bed;with call bell/phone within reach;with bed alarm set;with family/visitor present Nurse Communication: Mobility status PT Visit Diagnosis: Unsteadiness on feet (R26.81);Muscle weakness (generalized) (M62.81)     Time: 2993-7169 PT Time Calculation (min) (ACUTE ONLY): 27 min  Charges:    $Therapeutic Activity: 8-22 mins PT General Charges $$ ACUTE PT VISIT: 1 Visit           Olga Coaster PT, DPT 3:32 PM,12/16/22

## 2022-12-16 NOTE — Progress Notes (Signed)
[facility] Progress Note  Patient: Kyle Roberson    ZOX:[id number]  DOA: 11/26/2022     Date of Service: the patient was seen and examined on 12/16/2022  No chief complaint on file.  Brief hospital course: 82yo with h/o CAD, stage 5 CKD, chronic diastolic CHF, BLE lymphedema, afib on Eliquis, PAH, and SDH presented on 09/05 with weight gain and SOB.     Hospital course / major events:  He was diagnosed with acute on chronic CHF and started on Lasix drip.  Cardiology was consulted. Echo this admission with EF 55-60%, mild LVH, moderate RV enlargement with mildly decreased RV systolic function, PASP 73 mmHg, severe LAE, mild-moderate TR, IVC dilated.  IV midodrine drip 09/09.   RHC done on 09/11 with elevated R > L filling pressures, mostly right-sided, worsening renal function and decreased UOP so Lasix stopped.  Started on CRRT 09/12 for additional volume removal and completed this. 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  He is on IV Lasix.  Remains on heparin gtt for Afib, has been bradycardic w/ ventricular rates 40-50s but go into 60-70 range on standing. Resumed Eliquis 09/24 Permcath placement 12/14/22 and now pending outpatient placement and HD plan    Consultants:  Cardiology - advanced HF team  Nephrology  PCCM Palliative care Surgery PT/OT [facility] team   Procedures: Echocardiogram 9/6 CVC 9/12 RHC on 9/11 CRRT HD 9/17, 9/18, 09/20, 09/24 Permcath placed 09/23  Assessment and Plan: Acute on chronic diastolic congestive heart failure Pulmonary HTN, severe - group 2 PH (from elevated LVEDP  Presented with volume overload with increasing weight up to 255 pounds.  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 Bradycardic currently Apixaban 2.5 bid on hold. Continue heparin for now per cardiology  No role for PPM currently in light of need for HD and additional nidus for blood stream infection    Anemia of chronic inflammation, cardiorenal disease ESRD Starting Epo   Hyperphosphatemia Binders per nephro   L ankle pain Fell in the ER Xray ordered and negative CAM walker for use with ambulation   Hypertension Continue doxazosin, hydralazine   Hyperlipidemia Continue rosuvastatin, Lovaza   CAD with remote PCI Continue Imdur, statin No ASA   History of paroxysmal a fib Bradycardia currently so not on rate controlling medications Resume eliquis    Bilateral lower extremity lymphedema, chronic Chronic skin changes, dermatitis and possible fungal infection 9/25 Started Lotrisone cream twice daily   Goals of care Patient appears disinterested in aggressive care but his wife is insistent She has declined palliative care consultation, "he's not there yet" He has a  very poor overall prognosis based on cardiorenal failure and is not a good long-term HD candidate Ongoing discussions are encouraged regarding code status as well as possibly EOL care    Morbid obesity Complicates overall prognosis Body mass index is 36.5 kg/m.Marland Kitchen   Nutrition Problem: Increased nutrient needs Etiology: acute illness (CRRT) Signs/Symptoms: estimated needs   (( Body mass index is 12.5 kg/m.  Nutrition Problem: Increased nutrient needs Etiology: acute illness (CRRT) Interventions:  Above BMI is not accurate, texted to RN to fix body weight.  Pressure Injury 12/03/22 Buttocks Right;Left Stage 1 -  Intact skin with non-blanchable redness of a localized area usually over a bony prominence. Purple/blue (Active)  12/03/22 0820  Location: Buttocks  Location Orientation: Right;Left  Staging: Stage 1 -  Intact skin with non-blanchable redness of a localized area usually over a bony prominence.  Wound Description (Comments): Purple/blue  Present on Admission:   Dressing Type Foam - Lift dressing to assess site every shift 12/15/22 2255     Diet: NPO for HD catheter insertion DVT Prophylaxis: Therapeutic Anticoagulation with Eliquis    Advance goals of care discussion: Full code  Family Communication: family was present at bedside, at the time of interview.  The pt provided permission to discuss medical plan with the family. Opportunity was given to ask question and all questions were answered satisfactorily.   Disposition:  Pt is from home, admitted with renal failure, developed ESRD, started hemodialysis, still volume overload, which precludes a safe discharge. Discharge to Home with HH vs SNF TBD after PT/OT eval, when stable.  Subjective: No significant events overnight, patient was not in a good mood, he was not answering any of the questions properly.  He was upset of being in the hospital.  Denied any specific complaints.  Physical Exam: General: NAD, lying comfortably Appear in no distress, affect appropriate Eyes: PERRLA ENT: Oral Mucosa Clear, moist  Neck: no JVD,  Cardiovascular: S1 and S2 Present, no Murmur, bradycardic Respiratory: Equal air entry bilaterally, bilateral crackles, no wheezing  Abdomen: Bowel Sound  present, Soft and no tenderness,  Skin: no rashes Extremities: Chronic lymphedema bilateral lower extremities, with dry and scaly skin. no calf tenderness Neurologic: without any new focal findings Gait not checked due to patient safety concerns  Vitals:   12/16/22 1505 12/16/22 1515 12/16/22 1530 12/16/22 1600  BP: (!) 141/55 (!) 163/70 (!) 163/69 (!) 140/58  Pulse: (!) 57 65 66 (!) 51  Resp: 20 (!) 23 (!) 25   Temp:    98.2 F (36.8 C)  TempSrc:      SpO2: 95% 96% 95% 97%  Weight:      Height:        Intake/Output Summary (Last 24 hours) at 12/16/2022 1655 Last data filed at 12/16/2022 1000 Gross per 24 hour  Intake 220 ml  Output 200 ml  Net 20 ml   Filed Weights   12/11/22 1239 12/15/22 1000 12/15/22 1849  Weight: 32 kg 40.3 kg 37.3 kg    Data Reviewed: I have personally reviewed and interpreted daily labs, tele strips, imagings as discussed above. I reviewed all nursing notes, pharmacy notes, vitals, pertinent old records I have discussed plan of care as described above with RN and patient/family.  CBC: Recent Labs  Lab 12/11/22 0440 12/12/22 0552 12/13/22 0639 12/14/22 0729 12/15/22 0729  WBC 9.2 8.5 8.4 8.1 8.1  HGB 9.7* 10.1* 10.3* 9.9* 9.5*  HCT 29.9* 31.7* 33.4* 31.0* 30.4*  MCV 92.9 93.2 96.3 95.4  95.6  PLT 162 168 174 170 194   Basic Metabolic Panel: Recent Labs  Lab 12/12/22 0552 12/13/22 0639 12/14/22 0729 12/15/22 0729 12/16/22 0337  NA 134* 137 138 138 135  K 4.3 4.6 4.5 4.9 4.0  CL 98 101 101 102 100  CO2 26 26 25 25 26   GLUCOSE 92 99 132* 113* 89  BUN 46* 57* 67* 76* 43*  CREATININE 3.02* 3.66* 4.39* 4.67* 2.96*  CALCIUM 8.4* 8.6* 8.5* 8.6* 8.1*  PHOS 4.4 5.8* 6.6* 7.1* 4.5    Studies: PERIPHERAL VASCULAR CATHETERIZATION  Result Date: 12/16/2022 See surgical note for result.   Scheduled Meds:  apixaban  2.5 mg Oral BID   vitamin C  500 mg Oral BID   Chlorhexidine Gluconate Cloth  6 each Topical Daily   cholecalciferol  2,000  Units Oral Daily   clotrimazole-betamethasone   Topical BID   docusate sodium  100 mg Oral BID   doxazosin  1 mg Oral BID   epoetin (EPOGEN/PROCRIT) injection  10,000 Units Intravenous Q M,W,F-HD   feeding supplement (NEPRO CARB STEADY)  237 mL Oral TID BM   furosemide  80 mg Oral Q M,W,F   heparin sodium (porcine)  4,200 Units Intracatheter Once   insulin aspart  0-15 Units Subcutaneous TID AC & HS   insulin aspart  3 Units Subcutaneous TID WC   multivitamin  1 tablet Oral QHS   neomycin-polymyxin b-dexamethasone  1 Application Right Eye TID   omega-3 acid ethyl esters  1 g Oral Daily   oxyCODONE       rosuvastatin  10 mg Oral Once per day on Monday Wednesday Friday   sodium chloride flush  3 mL Intravenous Q12H   Continuous Infusions:  sodium chloride     PRN Meds: sodium chloride, acetaminophen **OR** acetaminophen, calamine, diphenhydrAMINE-zinc acetate, fentaNYL (SUBLIMAZE) injection, hydrocortisone cream, hydrOXYzine, ipratropium-albuterol, ondansetron **OR** ondansetron (ZOFRAN) IV, oxyCODONE, oxyCODONE, polyethylene glycol, sodium chloride flush, traZODone  Time spent: 35 minutes  Author: Gillis Santa. MD [facility] 12/16/2022 4:55 PM  To reach On-call, see care teams to locate the attending and reach out to them via [email or web address]. If 7PM-7AM, please contact night-coverage If you still have difficulty reaching the attending provider, please page the [facility] (Director on Call) for [facility] on amion for assistance.

## 2022-12-16 NOTE — Discharge Planning (Signed)
PLACEMENT RESOLVED: Outpatient Facility [facility] [address] [place], [place] [address] [phone]  Schedule: TTS 6:45am First Treatment: Tuesday 10/1 6:30am  Dimas Chyle Dialysis Coordinator II  [facility] Cell: [phone] eFax: [phone] [email or web address]

## 2022-12-16 NOTE — H&P (View-Only) (Signed)
Patients dialysis catheter was broken by nurse during dialysis yesterday. Patient will need to go back to the vascular lab for a dialysis Perma Catheter exchange.

## 2022-12-16 NOTE — Interval H&P Note (Signed)
History and Physical Interval Note:  12/16/2022 2:23 PM  Kyle Roberson  has presented today for surgery, with the diagnosis of ESRD.  The various methods of treatment have been discussed with the patient and family. After consideration of risks, benefits and other options for treatment, the patient has consented to  Procedure(s): DIALYSIS/PERMA CATHETER REPAIR (N/A) as a surgical intervention.  The patient's history has been reviewed, patient examined, no change in status, stable for surgery.  I have reviewed the patient's chart and labs.  Questions were answered to the patient's satisfaction.     Festus Barren

## 2022-12-16 NOTE — Plan of Care (Signed)
  Problem: Education: Goal: Ability to describe self-care measures that may prevent or decrease complications (Diabetes Survival Skills Education) will improve Outcome: Progressing Goal: Individualized Educational Video(s) Outcome: Progressing   Problem: Coping: Goal: Ability to adjust to condition or change in health will improve Outcome: Progressing   Problem: Fluid Volume: Goal: Ability to maintain a balanced intake and output will improve Outcome: Progressing   Problem: Health Behavior/Discharge Planning: Goal: Ability to identify and utilize available resources and services will improve Outcome: Progressing Goal: Ability to manage health-related needs will improve Outcome: Progressing   Problem: Metabolic: Goal: Ability to maintain appropriate glucose levels will improve Outcome: Progressing   Problem: Nutritional: Goal: Maintenance of adequate nutrition will improve Outcome: Progressing Goal: Progress toward achieving an optimal weight will improve Outcome: Progressing   Problem: Skin Integrity: Goal: Risk for impaired skin integrity will decrease Outcome: Progressing   Problem: Tissue Perfusion: Goal: Adequacy of tissue perfusion will improve Outcome: Progressing   Problem: Education: Goal: Knowledge of General Education information will improve Description: Including pain rating scale, medication(s)/side effects and non-pharmacologic comfort measures Outcome: Progressing   Problem: Health Behavior/Discharge Planning: Goal: Ability to manage health-related needs will improve Outcome: Progressing   Problem: Clinical Measurements: Goal: Ability to maintain clinical measurements within normal limits will improve Outcome: Progressing Goal: Will remain free from infection Outcome: Progressing Goal: Diagnostic test results will improve Outcome: Progressing Goal: Respiratory complications will improve Outcome: Progressing Goal: Cardiovascular complication will  be avoided Outcome: Progressing   Problem: Activity: Goal: Risk for activity intolerance will decrease Outcome: Progressing   Problem: Nutrition: Goal: Adequate nutrition will be maintained Outcome: Progressing   Problem: Coping: Goal: Level of anxiety will decrease Outcome: Progressing   Problem: Elimination: Goal: Will not experience complications related to bowel motility Outcome: Progressing Goal: Will not experience complications related to urinary retention Outcome: Progressing   Problem: Pain Managment: Goal: General experience of comfort will improve Outcome: Progressing   Problem: Safety: Goal: Ability to remain free from injury will improve Outcome: Progressing   Problem: Skin Integrity: Goal: Risk for impaired skin integrity will decrease Outcome: Progressing   Problem: Education: Goal: Understanding of CV disease, CV risk reduction, and recovery process will improve Outcome: Progressing Goal: Individualized Educational Video(s) Outcome: Progressing   Problem: Activity: Goal: Ability to return to baseline activity level will improve Outcome: Progressing   Problem: Cardiovascular: Goal: Ability to achieve and maintain adequate cardiovascular perfusion will improve Outcome: Progressing Goal: Vascular access site(s) Level 0-1 will be maintained Outcome: Progressing   Problem: Health Behavior/Discharge Planning: Goal: Ability to safely manage health-related needs after discharge will improve Outcome: Progressing   Problem: Education: Goal: Knowledge of disease and its progression will improve Outcome: Progressing   Problem: Fluid Volume: Goal: Compliance with measures to maintain balanced fluid volume will improve Outcome: Progressing   Problem: Health Behavior/Discharge Planning: Goal: Ability to manage health-related needs will improve Outcome: Progressing   Problem: Nutritional: Goal: Ability to make healthy dietary choices will  improve Outcome: Progressing   Problem: Clinical Measurements: Goal: Complications related to the disease process, condition or treatment will be avoided or minimized Outcome: Progressing

## 2022-12-17 ENCOUNTER — Inpatient Hospital Stay: Payer: Medicare HMO

## 2022-12-17 ENCOUNTER — Encounter: Payer: Self-pay | Admitting: Vascular Surgery

## 2022-12-17 DIAGNOSIS — I5033 Acute on chronic diastolic (congestive) heart failure: Secondary | ICD-10-CM | POA: Diagnosis not present

## 2022-12-17 LAB — CBC
HCT: 29.4 % — ABNORMAL LOW (ref 39.0–52.0)
Hemoglobin: 9.3 g/dL — ABNORMAL LOW (ref 13.0–17.0)
MCH: 30.2 pg (ref 26.0–34.0)
MCHC: 31.6 g/dL (ref 30.0–36.0)
MCV: 95.5 fL (ref 80.0–100.0)
Platelets: 146 10*3/uL — ABNORMAL LOW (ref 150–400)
RBC: 3.08 MIL/uL — ABNORMAL LOW (ref 4.22–5.81)
RDW: 14.7 % (ref 11.5–15.5)
WBC: 7.2 10*3/uL (ref 4.0–10.5)
nRBC: 0 % (ref 0.0–0.2)

## 2022-12-17 LAB — BASIC METABOLIC PANEL
Anion gap: 13 (ref 5–15)
BUN: 55 mg/dL — ABNORMAL HIGH (ref 8–23)
CO2: 25 mmol/L (ref 22–32)
Calcium: 8.4 mg/dL — ABNORMAL LOW (ref 8.9–10.3)
Chloride: 98 mmol/L (ref 98–111)
Creatinine, Ser: 4.09 mg/dL — ABNORMAL HIGH (ref 0.61–1.24)
GFR, Estimated: 14 mL/min — ABNORMAL LOW (ref >60)
Glucose, Bld: 84 mg/dL (ref 70–99)
Potassium: 4.6 mmol/L (ref 3.5–5.1)
Sodium: 136 mmol/L (ref 135–145)

## 2022-12-17 LAB — GLUCOSE, CAPILLARY
Glucose-Capillary: 80 mg/dL (ref 70–99)
Glucose-Capillary: 183 mg/dL — ABNORMAL HIGH (ref 70–99)
Glucose-Capillary: 194 mg/dL — ABNORMAL HIGH (ref 70–99)
Glucose-Capillary: 129 mg/dL — ABNORMAL HIGH (ref 70–99)

## 2022-12-17 LAB — RENAL FUNCTION PANEL
Albumin: 3.2 g/dL — ABNORMAL LOW (ref 3.5–5.0)
Anion gap: 8 (ref 5–15)
BUN: 57 mg/dL — ABNORMAL HIGH (ref 8–23)
CO2: 26 mmol/L (ref 22–32)
Calcium: 8.4 mg/dL — ABNORMAL LOW (ref 8.9–10.3)
Chloride: 99 mmol/L (ref 98–111)
Creatinine, Ser: 4.16 mg/dL — ABNORMAL HIGH (ref 0.61–1.24)
GFR, Estimated: 13 mL/min — ABNORMAL LOW (ref >60)
Glucose, Bld: 91 mg/dL (ref 70–99)
Phosphorus: 6.1 mg/dL — ABNORMAL HIGH (ref 2.5–4.6)
Potassium: 4.5 mmol/L (ref 3.5–5.1)
Sodium: 133 mmol/L — ABNORMAL LOW (ref 135–145)

## 2022-12-17 LAB — PHOSPHORUS: Phosphorus: 6.2 mg/dL — ABNORMAL HIGH (ref 2.5–4.6)

## 2022-12-17 LAB — MAGNESIUM: Magnesium: 1.9 mg/dL (ref 1.7–2.4)

## 2022-12-17 MED ORDER — ALTEPLASE 2 MG IJ SOLR: 2.0000 mg | Freq: Once | INTRAMUSCULAR | Status: DC | PRN

## 2022-12-17 MED ORDER — EPOETIN ALFA 10000 UNIT/ML IJ SOLN
4000.0000 [IU] | INTRAMUSCULAR | Status: DC
  Administered 2022-12-17 – 2022-12-19 (×3): 4000 [IU] via INTRAVENOUS
  Filled 2022-12-17 (×2): qty 1

## 2022-12-17 MED ORDER — HEPARIN SODIUM (PORCINE) 1000 UNIT/ML DIALYSIS: 1000.0000 [IU] | INTRAMUSCULAR | Status: DC | PRN

## 2022-12-17 MED ORDER — EPOETIN ALFA 4000 UNIT/ML IJ SOLN
INTRAMUSCULAR | Status: AC
  Filled 2022-12-17: qty 1

## 2022-12-17 NOTE — Care Management Important Message (Signed)
Important Message  Patient Details  Name: Kyle Roberson MRN: [id number] Date of Birth: 07-14-1940   Important Message Given:  Yes - Medicare IM  Patient out of room for dialysis upon time of visit, no family in room.  Copy of Medicare IM left on windowsill for reference.   [name] 12/17/2022, 1:11 PM

## 2022-12-17 NOTE — Progress Notes (Signed)
Pt placed in transport at 0717.  As of now pt still not down in unit.  Transporters stating that wife is refusing to let him come down until he eats breakfast.  Transporters have already been to his room 2 times.

## 2022-12-17 NOTE — Progress Notes (Signed)
OT Cancellation Note  Patient Details Name: Kyle Roberson MRN: [id number] DOB: 1940/05/17   Cancelled Treatment:    Reason Eval/Treat Not Completed: Patient at procedure or test/ unavailable. Upon attempt, pt out of the room. Noted to be in dialysis this morning. Will re-attempt OT tx at later date/time as pt is available.   Arman Filter., MPH, MS, OTR/L ascom [phone] 12/17/22, 10:43 AM

## 2022-12-17 NOTE — Progress Notes (Signed)
[facility] Progress Note  Patient: Kyle Roberson    MVH:[id number]  DOA: 11/26/2022     Date of Service: the patient was seen and examined on 12/17/2022  No chief complaint on file.  Brief hospital course: 81yo with h/o CAD, stage 5 CKD, chronic diastolic CHF, BLE lymphedema, afib on Eliquis, PAH, and SDH presented on 09/05 with weight gain and SOB.     Hospital course / major events:  He was diagnosed with acute on chronic CHF and started on Lasix drip.  Cardiology was consulted. Echo this admission with EF 55-60%, mild LVH, moderate RV enlargement with mildly decreased RV systolic function, PASP 73 mmHg, severe LAE, mild-moderate TR, IVC dilated.  IV midodrine drip 09/09.   RHC done on 09/11 with elevated R > L filling pressures, mostly right-sided, worsening renal function and decreased UOP so Lasix stopped.  Started on CRRT 09/12 for additional volume removal and completed this. 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  He is on IV Lasix.  Remains on heparin gtt for Afib, has been bradycardic w/ ventricular rates 40-50s but go into 60-70 range on standing. Resumed Eliquis 09/24 Permcath placement 12/14/22 and now pending outpatient placement and HD plan    Consultants:  Cardiology - advanced HF team  Nephrology  PCCM Palliative care Surgery PT/OT [facility] team   Procedures: Echocardiogram 9/6 CVC 9/12 RHC on 9/11 CRRT HD 9/17, 9/18, 09/20, 09/24 Permcath placed 09/23  Assessment and Plan: Acute on chronic diastolic congestive heart failure Pulmonary HTN, severe - group 2 PH (from elevated LVEDP  Presented with volume overload with increasing weight up to 255 pounds.  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 Bradycardic currently Apixaban 2.5 bid on hold. Continue heparin for now per cardiology  No role for PPM currently in light of need for HD and additional nidus for blood stream infection    Anemia of chronic inflammation, cardiorenal disease ESRD Starting Epo   Hyperphosphatemia Binders per nephro   L ankle pain Fell in the ER Xray ordered and negative CAM walker for use with ambulation   Hypertension Continue doxazosin, hydralazine   Hyperlipidemia Continue rosuvastatin, Lovaza   CAD with remote PCI Continue Imdur, statin No ASA   History of paroxysmal a fib Bradycardia currently so not on rate controlling medications Resume eliquis    Bilateral lower extremity lymphedema, chronic Chronic skin changes, dermatitis and possible fungal infection 9/25 Started Lotrisone cream twice daily   Goals of care Patient appears disinterested in aggressive care but his wife is insistent She has declined palliative care consultation, "he's not there yet" He has a  very poor overall prognosis based on cardiorenal failure and is not a good long-term HD candidate Ongoing discussions are encouraged regarding code status as well as possibly EOL care    Morbid obesity Complicates overall prognosis Body mass index is 36.5 kg/m.Marland Kitchen   Nutrition Problem: Increased nutrient needs Etiology: acute illness (CRRT) Signs/Symptoms: estimated needs   (( Body mass index is 12.5 kg/m.  Nutrition Problem: Increased nutrient needs Etiology: acute illness (CRRT) Interventions:  Above BMI is not accurate, texted to RN to fix body weight.  Pressure Injury 12/03/22 Buttocks Right;Left Stage 1 -  Intact skin with non-blanchable redness of a localized area usually over a bony prominence. Purple/blue (Active)  12/03/22 0820  Location: Buttocks  Location Orientation: Right;Left  Staging: Stage 1 -  Intact skin with non-blanchable redness of a localized area usually over a bony prominence.  Wound Description (Comments): Purple/blue  Present on Admission:   Dressing Type Foam - Lift dressing to assess site every shift 12/16/22 2210     Diet: Renal diet DVT Prophylaxis: Therapeutic Anticoagulation with Eliquis    Advance goals of care discussion: Full code  Family Communication: family was present at bedside, at the time of interview.  The pt provided permission to discuss medical plan with the family. Opportunity was given to ask question and all questions were answered satisfactorily.   Disposition:  Pt is from home, admitted with renal failure, developed ESRD, started hemodialysis, still volume overload, which precludes a safe discharge. Discharge to SNF, when bed will be available.    Subjective: No significant events overnight, patient was seen during hemodialysis, stated that he does not feel good.  Patient is not in a good mood as usual.  No new specific complaints.   Physical Exam: General: NAD, lying comfortably Appear in no distress, affect appropriate Eyes: PERRLA ENT: Oral Mucosa Clear, moist  Neck: no JVD,  Cardiovascular: S1 and S2 Present, no Murmur, bradycardic Respiratory: Equal air entry bilaterally, bilateral crackles, no wheezing  Abdomen: Bowel Sound present, Soft and no tenderness,  Skin: no  rashes Extremities: Chronic lymphedema bilateral lower extremities, with dry and scaly skin. no calf tenderness Neurologic: without any new focal findings Gait not checked due to patient safety concerns  Vitals:   12/17/22 1130 12/17/22 1230 12/17/22 1325 12/17/22 1426  BP: (!) 138/52 (!) 130/57 (!) 129/57 (!) 138/59  Pulse: 63 60 (!) 59 (!) 49  Resp: (!) 25 19 (!) 24 20  Temp:   (!) 97.5 F (36.4 C) 98 F (36.7 C)  TempSrc:      SpO2: 98% 100% 99% 100%  Weight:      Height:        Intake/Output Summary (Last 24 hours) at 12/17/2022 1531 Last data filed at 12/17/2022 1325 Gross per 24 hour  Intake 480 ml  Output 2.5 ml  Net 477.5 ml   Filed Weights   12/11/22 1239 12/15/22 1000 12/15/22 1849  Weight: 32 kg 40.3 kg 37.3 kg    Data Reviewed: I have personally reviewed and interpreted daily labs, tele strips, imagings as discussed above. I reviewed all nursing notes, pharmacy notes, vitals, pertinent old records I have discussed plan of care as described above with RN and patient/family.  CBC: Recent Labs  Lab 12/12/22 0552 12/13/22 0639 12/14/22 0729 12/15/22 0729 12/17/22 0743  WBC 8.5 8.4 8.1 8.1 7.2  HGB 10.1* 10.3* 9.9* 9.5* 9.3*  HCT 31.7* 33.4* 31.0* 30.4* 29.4*  MCV 93.2 96.3 95.4 95.6 95.5  PLT 168  174 170 194 146*   Basic Metabolic Panel: Recent Labs  Lab 12/14/22 0729 12/15/22 0729 12/16/22 0337 12/17/22 0605 12/17/22 0743  NA 138 138 135 133* 136  K 4.5 4.9 4.0 4.5 4.6  CL 101 102 100 99 98  CO2 25 25 26 26 25   GLUCOSE 132* 113* 89 91 84  BUN 67* 76* 43* 57* 55*  CREATININE 4.39* 4.67* 2.96* 4.16* 4.09*  CALCIUM 8.5* 8.6* 8.1* 8.4* 8.4*  MG  --   --   --   --  1.9  PHOS 6.6* 7.1* 4.5 6.1* 6.2*    Studies: No results found.  Scheduled Meds:  apixaban  2.5 mg Oral BID   vitamin C  500 mg Oral BID   Chlorhexidine Gluconate Cloth  6 each Topical Daily   cholecalciferol  2,000 Units Oral Daily   clotrimazole-betamethasone   Topical BID    docusate sodium  100 mg Oral BID   doxazosin  1 mg Oral BID   epoetin (EPOGEN/PROCRIT) injection  4,000 Units Intravenous Q T,Th,Sa-HD   feeding supplement (NEPRO CARB STEADY)  237 mL Oral TID BM   furosemide  80 mg Oral Q M,W,F   heparin sodium (porcine)  4,200 Units Intracatheter Once   insulin aspart  0-15 Units Subcutaneous TID AC & HS   insulin aspart  3 Units Subcutaneous TID WC   multivitamin  1 tablet Oral QHS   neomycin-polymyxin b-dexamethasone  1 Application Right Eye TID   omega-3 acid ethyl esters  1 g Oral Daily   rosuvastatin  10 mg Oral Once per day on Monday Wednesday Friday   sodium chloride flush  3 mL Intravenous Q12H   Continuous Infusions:  sodium chloride     PRN Meds: sodium chloride, acetaminophen **OR** acetaminophen, calamine, diphenhydrAMINE-zinc acetate, fentaNYL (SUBLIMAZE) injection, hydrocortisone cream, hydrOXYzine, ipratropium-albuterol, ondansetron **OR** ondansetron (ZOFRAN) IV, oxyCODONE, polyethylene glycol, sodium chloride flush, traZODone  Time spent: 35 minutes  Author: Gillis Santa. MD [facility] 12/17/2022 3:31 PM  To reach On-call, see care teams to locate the attending and reach out to them via [email or web address]. If 7PM-7AM, please contact night-coverage If you still have difficulty reaching the attending provider, please page the [facility] (Director on Call) for [facility] on amion for assistance.

## 2022-12-17 NOTE — Progress Notes (Signed)
Received patient in bed to unit.    Informed consent signed and in chart.    TX duration: 3 hrs 11 min       Transported back to floor  Hand-off given to patient's nurse.   Access used:  rt chest cvc Access issues: n/a  Total UF removed:  Medication(s) given: 2.5 L Post HD VS: 129/57 52     Maple Hudson, RN Dialysis Unit

## 2022-12-17 NOTE — Plan of Care (Signed)
  Problem: Education: Goal: Ability to describe self-care measures that may prevent or decrease complications (Diabetes Survival Skills Education) will improve Outcome: Progressing Goal: Individualized Educational Video(s) Outcome: Progressing   Problem: Coping: Goal: Ability to adjust to condition or change in health will improve Outcome: Progressing   Problem: Fluid Volume: Goal: Ability to maintain a balanced intake and output will improve Outcome: Progressing   Problem: Health Behavior/Discharge Planning: Goal: Ability to identify and utilize available resources and services will improve Outcome: Progressing Goal: Ability to manage health-related needs will improve Outcome: Progressing   Problem: Metabolic: Goal: Ability to maintain appropriate glucose levels will improve Outcome: Progressing   Problem: Nutritional: Goal: Maintenance of adequate nutrition will improve Outcome: Progressing Goal: Progress toward achieving an optimal weight will improve Outcome: Progressing   Problem: Skin Integrity: Goal: Risk for impaired skin integrity will decrease Outcome: Progressing   Problem: Tissue Perfusion: Goal: Adequacy of tissue perfusion will improve Outcome: Progressing   Problem: Education: Goal: Knowledge of General Education information will improve Description: Including pain rating scale, medication(s)/side effects and non-pharmacologic comfort measures Outcome: Progressing   Problem: Health Behavior/Discharge Planning: Goal: Ability to manage health-related needs will improve Outcome: Progressing   Problem: Clinical Measurements: Goal: Ability to maintain clinical measurements within normal limits will improve Outcome: Progressing Goal: Will remain free from infection Outcome: Progressing Goal: Diagnostic test results will improve Outcome: Progressing Goal: Respiratory complications will improve Outcome: Progressing Goal: Cardiovascular complication will  be avoided Outcome: Progressing   Problem: Activity: Goal: Risk for activity intolerance will decrease Outcome: Progressing   Problem: Nutrition: Goal: Adequate nutrition will be maintained Outcome: Progressing   Problem: Coping: Goal: Level of anxiety will decrease Outcome: Progressing   Problem: Elimination: Goal: Will not experience complications related to bowel motility Outcome: Progressing Goal: Will not experience complications related to urinary retention Outcome: Progressing   Problem: Pain Managment: Goal: General experience of comfort will improve Outcome: Progressing   Problem: Safety: Goal: Ability to remain free from injury will improve Outcome: Progressing   Problem: Skin Integrity: Goal: Risk for impaired skin integrity will decrease Outcome: Progressing   Problem: Education: Goal: Understanding of CV disease, CV risk reduction, and recovery process will improve Outcome: Progressing Goal: Individualized Educational Video(s) Outcome: Progressing   Problem: Activity: Goal: Ability to return to baseline activity level will improve Outcome: Progressing   Problem: Cardiovascular: Goal: Ability to achieve and maintain adequate cardiovascular perfusion will improve Outcome: Progressing Goal: Vascular access site(s) Level 0-1 will be maintained Outcome: Progressing   Problem: Health Behavior/Discharge Planning: Goal: Ability to safely manage health-related needs after discharge will improve Outcome: Progressing   Problem: Education: Goal: Knowledge of disease and its progression will improve Outcome: Progressing   Problem: Fluid Volume: Goal: Compliance with measures to maintain balanced fluid volume will improve Outcome: Progressing   Problem: Health Behavior/Discharge Planning: Goal: Ability to manage health-related needs will improve Outcome: Progressing   Problem: Nutritional: Goal: Ability to make healthy dietary choices will  improve Outcome: Progressing   Problem: Clinical Measurements: Goal: Complications related to the disease process, condition or treatment will be avoided or minimized Outcome: Progressing

## 2022-12-17 NOTE — Progress Notes (Addendum)
[facility]  ROUNDING NOTE   Subjective:   Patient seen and evaluated during dialysis.   HEMODIALYSIS FLOWSHEET:  Blood Flow Rate (mL/min): 399 mL/min Arterial Pressure (mmHg): -165.45 mmHg Venous Pressure (mmHg): 169.08 mmHg TMP (mmHg): 8.48 mmHg Ultrafiltration Rate (mL/min): 1114 mL/min Dialysate Flow Rate (mL/min): 300 ml/min  Tolerating treatment well.   Objective:  Vital signs in last 24 hours:  Temp:  [97.7 F (36.5 C)-98.2 F (36.8 C)] 97.7 F (36.5 C) (09/26 0827) Pulse Rate:  [0-85] 55 (09/26 1000) Resp:  [12-27] 27 (09/26 1000) BP: (122-163)/(49-88) 122/54 (09/26 1000) SpO2:  [87 %-100 %] 98 % (09/26 1000)  Weight change:  Filed Weights   12/11/22 1239 12/15/22 1000 12/15/22 1849  Weight: 32 kg 40.3 kg 37.3 kg    Intake/Output: I/O last 3 completed shifts: In: 340 [P.O.:340] Out: 200 [Urine:200]   Intake/Output this shift:  No intake/output data recorded.  Physical Exam: General: NAD  Head: Normocephalic, atraumatic. Moist oral mucosal membranes  Eyes: Anicteric, ectropion  Lungs:  Diminished, room air  Heart: bradycardia  Abdomen:  Soft, nontender  Extremities:  + peripheral edema.  Neurologic: Alert and oriented, moving all four extremities  Skin: No lesions  Access: Rt Chest permcath    Basic Metabolic Panel: Recent Labs  Lab 12/14/22 0729 12/15/22 0729 12/16/22 0337 12/17/22 0605 12/17/22 0743  NA 138 138 135 133* 136  K 4.5 4.9 4.0 4.5 4.6  CL 101 102 100 99 98  CO2 25 25 26 26 25   GLUCOSE 132* 113* 89 91 84  BUN 67* 76* 43* 57* 55*  CREATININE 4.39* 4.67* 2.96* 4.16* 4.09*  CALCIUM 8.5* 8.6* 8.1* 8.4* 8.4*  MG  --   --   --   --  1.9  PHOS 6.6* 7.1* 4.5 6.1* 6.2*    Liver Function Tests: Recent Labs  Lab 12/13/22 0639 12/14/22 0729 12/15/22 0729 12/16/22 0337 12/17/22 0605  ALBUMIN 3.3* 3.3* 3.2* 3.1* 3.2*   No results for input(s): "LIPASE", "AMYLASE" in the last 168 hours. No results for input(s):  "AMMONIA" in the last 168 hours.  CBC: Recent Labs  Lab 12/12/22 0552 12/13/22 0639 12/14/22 0729 12/15/22 0729 12/17/22 0743  WBC 8.5 8.4 8.1 8.1 7.2  HGB 10.1* 10.3* 9.9* 9.5* 9.3*  HCT 31.7* 33.4* 31.0* 30.4* 29.4*  MCV 93.2 96.3 95.4 95.6 95.5  PLT 168 174 170 194 146*    Cardiac Enzymes: No results for input(s): "CKTOTAL", "CKMB", "CKMBINDEX", "TROPONINI" in the last 168 hours.  BNP: Invalid input(s): "POCBNP"  CBG: Recent Labs  Lab 12/16/22 0823 12/16/22 1201 12/16/22 1601 12/16/22 2041 12/17/22 0824  GLUCAP 90 88 103* 148* 80    Microbiology: Results for orders placed or performed during the hospital encounter of 11/26/22  MRSA Next Gen by PCR, Nasal     Status: None   Collection Time: 11/30/22  4:10 PM   Specimen: Nasal Mucosa; Nasal Swab  Result Value Ref Range Status   MRSA by PCR Next Gen NOT DETECTED NOT DETECTED Final    Comment: (NOTE) The GeneXpert MRSA Assay (FDA approved for NASAL specimens only), is one component of a comprehensive MRSA colonization surveillance program. It is not intended to diagnose MRSA infection nor to guide or monitor treatment for MRSA infections. Test performance is not FDA approved in patients less than 43 years old. Performed at [facility], [address] [place], [place] [address]     Coagulation Studies: No results for input(s): "LABPROT", "INR" in  the last 72 hours.   Urinalysis: No results for input(s): "COLORURINE", "LABSPEC", "PHURINE", "GLUCOSEU", "HGBUR", "BILIRUBINUR", "KETONESUR", "PROTEINUR", "UROBILINOGEN", "NITRITE", "LEUKOCYTESUR" in the last 72 hours.  Invalid input(s): "APPERANCEUR"    Imaging: PERIPHERAL VASCULAR CATHETERIZATION  Result Date: 12/16/2022 See surgical note for result.    Medications:    sodium chloride      apixaban  2.5 mg Oral BID   vitamin C  500 mg Oral BID   Chlorhexidine Gluconate Cloth  6 each Topical Daily   cholecalciferol  2,000 Units Oral Daily    clotrimazole-betamethasone   Topical BID   docusate sodium  100 mg Oral BID   doxazosin  1 mg Oral BID   epoetin (EPOGEN/PROCRIT) injection  10,000 Units Intravenous Q M,W,F-HD   feeding supplement (NEPRO CARB STEADY)  237 mL Oral TID BM   furosemide  80 mg Oral Q M,W,F   heparin sodium (porcine)  4,200 Units Intracatheter Once   insulin aspart  0-15 Units Subcutaneous TID AC & HS   insulin aspart  3 Units Subcutaneous TID WC   multivitamin  1 tablet Oral QHS   neomycin-polymyxin b-dexamethasone  1 Application Right Eye TID   omega-3 acid ethyl esters  1 g Oral Daily   rosuvastatin  10 mg Oral Once per day on Monday Wednesday Friday   sodium chloride flush  3 mL Intravenous Q12H   sodium chloride, acetaminophen **OR** acetaminophen, alteplase, calamine, diphenhydrAMINE-zinc acetate, fentaNYL (SUBLIMAZE) injection, heparin, hydrocortisone cream, hydrOXYzine, ipratropium-albuterol, ondansetron **OR** ondansetron (ZOFRAN) IV, oxyCODONE, polyethylene glycol, sodium chloride flush, traZODone  Assessment/ Plan:  [name] is a 82 y.o.  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 Due to progression of kidney disease and outpatient diuretic therapy failure, we feel this patient has progressed to end stage renal disease.  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 CRRT from 9/12 to 9/13 Receiving dialysis today, UF goal 3L as tolerated.  - Permcath functioning fair -Appreciate vascular exchanging permcath on 12/16/22 -Next treatment scheduled for Saturday - Continue oral furosemide 80mg  daily on nonHD days - Renal navigator  has confirmed outpatient clinic at [facility] on a TTS schedule. This may change depending on rehab center.   Acute exacerbation of chronic diastolic congestive heart failure: complicated with acute respiratory failure and pulmonary hypertension. Currently with junctional rhythm. - Room air  -Fluid management with dialysis and diuresis.  Diabetes mellitus type II with chronic kidney disease - holding empagliflozin    LOS: 21 Kyle Roberson 9/26/202410:37 AM

## 2022-12-18 ENCOUNTER — Inpatient Hospital Stay: Payer: Medicare HMO

## 2022-12-18 DIAGNOSIS — I5033 Acute on chronic diastolic (congestive) heart failure: Secondary | ICD-10-CM | POA: Diagnosis not present

## 2022-12-18 LAB — GLUCOSE, CAPILLARY
Glucose-Capillary: 93 mg/dL (ref 70–99)
Glucose-Capillary: 242 mg/dL — ABNORMAL HIGH (ref 70–99)
Glucose-Capillary: 182 mg/dL — ABNORMAL HIGH (ref 70–99)
Glucose-Capillary: 187 mg/dL — ABNORMAL HIGH (ref 70–99)

## 2022-12-18 LAB — RENAL FUNCTION PANEL
Albumin: 3.1 g/dL — ABNORMAL LOW (ref 3.5–5.0)
Anion gap: 11 (ref 5–15)
BUN: 38 mg/dL — ABNORMAL HIGH (ref 8–23)
CO2: 28 mmol/L (ref 22–32)
Calcium: 8.4 mg/dL — ABNORMAL LOW (ref 8.9–10.3)
Chloride: 97 mmol/L — ABNORMAL LOW (ref 98–111)
Creatinine, Ser: 2.97 mg/dL — ABNORMAL HIGH (ref 0.61–1.24)
GFR, Estimated: 20 mL/min — ABNORMAL LOW (ref >60)
Glucose, Bld: 100 mg/dL — ABNORMAL HIGH (ref 70–99)
Phosphorus: 4.4 mg/dL (ref 2.5–4.6)
Potassium: 4 mmol/L (ref 3.5–5.1)
Sodium: 136 mmol/L (ref 135–145)

## 2022-12-18 NOTE — Progress Notes (Signed)
   12/18/22 1300  Spiritual Encounters  Type of Visit Initial  Care provided to: Pt and family  Conversation partners present during encounter Nurse  Referral source Chaplain assessment  Reason for visit Routine spiritual support  OnCall Visit No   Chaplain went to visit the patient while doing rounds. Patient was receiving medication from his nurse and also about to eat lunch. The patients wife asked if I could come back later and talk. She stated that they would really like that and that they appreciated the chaplain for visiting. Mirna Mires will do a follow up visit.

## 2022-12-18 NOTE — Discharge Planning (Signed)
Continuing to follow for OPHD placement. Patient has been accepted to [facility], however we waiting on SNF selection. If wife ch[place] a [place]F out of county, OPHD center will have to be switched.   Dimas Chyle Dialysis Coordinator II  [facility] Cell: [phone] eFax: [phone] [email or web address]

## 2022-12-18 NOTE — Plan of Care (Signed)
  Problem: Education: Goal: Ability to describe self-care measures that may prevent or decrease complications (Diabetes Survival Skills Education) will improve Outcome: Progressing Goal: Individualized Educational Video(s) Outcome: Progressing   Problem: Coping: Goal: Ability to adjust to condition or change in health will improve Outcome: Progressing   Problem: Fluid Volume: Goal: Ability to maintain a balanced intake and output will improve Outcome: Progressing   Problem: Health Behavior/Discharge Planning: Goal: Ability to identify and utilize available resources and services will improve Outcome: Progressing Goal: Ability to manage health-related needs will improve Outcome: Progressing   Problem: Metabolic: Goal: Ability to maintain appropriate glucose levels will improve Outcome: Progressing   Problem: Nutritional: Goal: Maintenance of adequate nutrition will improve Outcome: Progressing Goal: Progress toward achieving an optimal weight will improve Outcome: Progressing   Problem: Skin Integrity: Goal: Risk for impaired skin integrity will decrease Outcome: Progressing   Problem: Tissue Perfusion: Goal: Adequacy of tissue perfusion will improve Outcome: Progressing   Problem: Education: Goal: Knowledge of General Education information will improve Description: Including pain rating scale, medication(s)/side effects and non-pharmacologic comfort measures Outcome: Progressing   Problem: Health Behavior/Discharge Planning: Goal: Ability to manage health-related needs will improve Outcome: Progressing   Problem: Clinical Measurements: Goal: Ability to maintain clinical measurements within normal limits will improve Outcome: Progressing Goal: Will remain free from infection Outcome: Progressing Goal: Diagnostic test results will improve Outcome: Progressing Goal: Respiratory complications will improve Outcome: Progressing Goal: Cardiovascular complication will  be avoided Outcome: Progressing   Problem: Activity: Goal: Risk for activity intolerance will decrease Outcome: Progressing   Problem: Nutrition: Goal: Adequate nutrition will be maintained Outcome: Progressing   Problem: Coping: Goal: Level of anxiety will decrease Outcome: Progressing   Problem: Elimination: Goal: Will not experience complications related to bowel motility Outcome: Progressing Goal: Will not experience complications related to urinary retention Outcome: Progressing   Problem: Pain Managment: Goal: General experience of comfort will improve Outcome: Progressing   Problem: Safety: Goal: Ability to remain free from injury will improve Outcome: Progressing   Problem: Skin Integrity: Goal: Risk for impaired skin integrity will decrease Outcome: Progressing   Problem: Education: Goal: Understanding of CV disease, CV risk reduction, and recovery process will improve Outcome: Progressing Goal: Individualized Educational Video(s) Outcome: Progressing   Problem: Activity: Goal: Ability to return to baseline activity level will improve Outcome: Progressing   Problem: Cardiovascular: Goal: Ability to achieve and maintain adequate cardiovascular perfusion will improve Outcome: Progressing Goal: Vascular access site(s) Level 0-1 will be maintained Outcome: Progressing   Problem: Health Behavior/Discharge Planning: Goal: Ability to safely manage health-related needs after discharge will improve Outcome: Progressing   Problem: Education: Goal: Knowledge of disease and its progression will improve Outcome: Progressing   Problem: Fluid Volume: Goal: Compliance with measures to maintain balanced fluid volume will improve Outcome: Progressing   Problem: Health Behavior/Discharge Planning: Goal: Ability to manage health-related needs will improve Outcome: Progressing   Problem: Nutritional: Goal: Ability to make healthy dietary choices will  improve Outcome: Progressing   Problem: Clinical Measurements: Goal: Complications related to the disease process, condition or treatment will be avoided or minimized Outcome: Progressing

## 2022-12-18 NOTE — Progress Notes (Signed)
[facility] Progress Note  Patient: Kyle Roberson    ZOX:[id number]  DOA: 11/26/2022     Date of Service: the patient was seen and examined on 12/18/2022  No chief complaint on file.  Brief hospital course: 81yo with h/o CAD, stage 5 CKD, chronic diastolic CHF, BLE lymphedema, afib on Eliquis, PAH, and SDH presented on 09/05 with weight gain and SOB.     Hospital course / major events:  He was diagnosed with acute on chronic CHF and started on Lasix drip.  Cardiology was consulted. Echo this admission with EF 55-60%, mild LVH, moderate RV enlargement with mildly decreased RV systolic function, PASP 73 mmHg, severe LAE, mild-moderate TR, IVC dilated.  IV midodrine drip 09/09.   RHC done on 09/11 with elevated R > L filling pressures, mostly right-sided, worsening renal function and decreased UOP so Lasix stopped.  Started on CRRT 09/12 for additional volume removal and completed this. 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  He is on IV Lasix.  Remains on heparin gtt for Afib, has been bradycardic w/ ventricular rates 40-50s but go into 60-70 range on standing. Resumed Eliquis 09/24 Permcath placement 12/14/22 and now pending outpatient placement and HD plan    Consultants:  Cardiology - advanced HF team  Nephrology  PCCM Palliative care Surgery PT/OT [facility] team   Procedures: Echocardiogram 9/6 CVC 9/12 RHC on 9/11 CRRT HD 9/17, 9/18, 09/20, 09/24 Permcath placed 09/23  Assessment and Plan: Acute on chronic diastolic congestive heart failure Pulmonary HTN, severe - group 2 PH (from elevated LVEDP  Presented with volume overload with increasing weight up to 255 pounds.  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 Bradycardic currently Apixaban was held and patient was on heparin which was discontinued and apixaban 2.5 mg p.o. twice daily was resumed.   No role for PPM currently in light of need for HD and additional nidus for blood stream infection    Anemia of chronic inflammation, cardiorenal disease ESRD Continue Epogen 4000 units every TTS on hemodialysis days as per nephrology.      Hyperphosphatemia Binders per nephro   L ankle pain Fell in the ER Xray ordered and negative CAM walker for use with ambulation   Hypertension Continue doxazosin, hydralazine   Hyperlipidemia Continue rosuvastatin, Lovaza   CAD with remote PCI Continue statin No ASA Imdur was discontinued by cardiologist   History of paroxysmal a fib Bradycardia currently so not on rate controlling medications Resume eliquis    Bilateral lower extremity lymphedema, chronic Chronic skin changes, dermatitis and possible fungal infection 9/25 Started Lotrisone cream twice daily   Goals of care Patient appears disinterested in aggressive care but  his wife is insistent She has declined palliative care consultation, "he's not there yet" He has a very poor overall prognosis based on cardiorenal failure and is not a good long-term HD candidate Ongoing discussions are encouraged regarding code status as  well as possibly EOL care    Morbid obesity Complicates overall prognosis Body mass index is 36.5 kg/m.Marland Kitchen  Nutrition Problem: Increased nutrient needs Etiology: acute illness (CRRT) Signs/Symptoms: estimated needs    Body mass index is 35.73 kg/m.  Nutrition Problem: Increased nutrient needs Etiology: acute illness (CRRT) Interventions:   Pressure Injury 12/03/22 Buttocks Right;Left Stage 1 -  Intact skin with non-blanchable redness of a localized area usually over a bony prominence. Purple/blue (Active)  12/03/22 0820  Location: Buttocks  Location Orientation: Right;Left  Staging: Stage 1 -  Intact skin with non-blanchable redness of a localized area usually over a bony prominence.  Wound Description (Comments): Purple/blue  Present on Admission:   Dressing Type Foam - Lift dressing to assess site every shift 12/18/22 1544     Diet: Renal diet DVT Prophylaxis: Therapeutic Anticoagulation with Eliquis    Advance goals of care discussion: Full code  Family Communication: family was present at bedside, at the time of interview.  The pt provided permission to discuss medical plan with the family. Opportunity was given to ask question and all questions were answered satisfactorily.   Disposition:  Pt is from home, admitted with renal failure, developed ESRD, started hemodialysis, still volume overload, which precludes a safe discharge. Discharge to SNF, when bed will be available.    Subjective: No significant events overnight, patient was laying comfortably in the bed.  Patient remained quiet, did not offer any complaints.  Seems little short of breath. \  Physical Exam: General: NAD, lying comfortably Appear in no distress, affect appropriate Eyes: PERRLA ENT: Oral Mucosa Clear, moist  Neck: no JVD,  Cardiovascular: S1 and S2 Present, no Murmur, bradycardic Respiratory: Equal air entry bilaterally, bilateral crackles, no wheezing  Abdomen: Bowel Sound present, Soft and  no tenderness,  Skin: no rashes Extremities: Chronic lymphedema bilateral lower extremities, with dry and scaly skin. no calf tenderness Neurologic: without any new focal findings Gait not checked due to patient safety concerns  Vitals:   12/18/22 0841 12/18/22 1140 12/18/22 1544 12/18/22 2012  BP: (!) 151/55 (!) 134/52 (!) 117/58 (!) 153/59  Pulse: (!) 56 (!) 54 71 64  Resp: 20 18 20 18   Temp: 98.4 F (36.9 C) 98 F (36.7 C) 98.4 F (36.9 C) 97.8 F (36.6 C)  TempSrc: Oral  Oral Oral  SpO2: 99% 99% 91% 93%  Weight:      Height:        Intake/Output Summary (Last 24 hours) at 12/18/2022 2032 Last data filed at 12/18/2022 1800 Gross per 24 hour  Intake 320 ml  Output --  Net 320 ml   Filed Weights   12/15/22 1000 12/15/22 1849 12/18/22 0500  Weight: 40.3 kg 37.3 kg 106.6 kg    Data Reviewed: I have personally reviewed and interpreted daily labs, tele strips, imagings as discussed above. I reviewed all nursing notes, pharmacy notes, vitals, pertinent old records I have discussed plan of care as described above with RN and patient/family.  CBC: Recent Labs  Lab 12/12/22 0552 12/13/22 0639 12/14/22 0729 12/15/22 0729 12/17/22 0743  WBC 8.5 8.4 8.1 8.1 7.2  HGB 10.1* 10.3* 9.9* 9.5* 9.3*  HCT 31.7* 33.4* 31.0* 30.4* 29.4*  MCV 93.2 96.3 95.4 95.6 95.5  PLT 168 174 170 194 146*   Basic Metabolic Panel: Recent Labs  Lab 12/15/22 0729 12/16/22 0337 12/17/22 0605 12/17/22 0743 12/18/22 0525  NA 138 135 133* 136 136  K 4.9 4.0 4.5 4.6 4.0  CL 102 100 99 98 97*  CO2 25 26 26 25 28   GLUCOSE 113* 89 91 84 100*  BUN 76* 43* 57* 55* 38*  CREATININE 4.67* 2.96* 4.16* 4.09* 2.97*  CALCIUM 8.6* 8.1* 8.4* 8.4* 8.4*  MG  --   --   --  1.9  --   PHOS 7.1* 4.5 6.1* 6.2* 4.4    Studies: DG Chest Port 1 View  Result Date: 12/18/2022 CLINICAL DATA:  Shortness of breath. EXAM: PORTABLE CHEST 1 VIEW COMPARISON:  December 03, 2022. FINDINGS: Stable cardiomegaly. Right  internal jugular dialysis catheter is in good position. Left lung is clear. Mild right basilar atelectasis and small pleural effusion is noted. Bony thorax is unremarkable. IMPRESSION: Mild right basilar atelectasis and small right pleural effusion. Electronically Signed   By: Lupita Raider M.D.   On: 12/18/2022 08:48    Scheduled Meds:  apixaban  2.5 mg Oral BID   vitamin C  500 mg Oral BID   Chlorhexidine Gluconate Cloth  6 each Topical Daily   cholecalciferol  2,000 Units Oral Daily   clotrimazole-betamethasone   Topical BID   docusate sodium  100 mg Oral BID   doxazosin  1 mg Oral BID   epoetin (EPOGEN/PROCRIT) injection  4,000 Units Intravenous Q T,Th,Sa-HD   feeding supplement (NEPRO CARB STEADY)  237 mL Oral TID BM   furosemide  80 mg Oral Q M,W,F   heparin sodium (porcine)  4,200 Units Intracatheter Once   insulin aspart  0-15 Units Subcutaneous TID AC & HS   insulin aspart  3 Units Subcutaneous TID WC   multivitamin  1 tablet Oral QHS   neomycin-polymyxin b-dexamethasone  1 Application Right Eye TID   omega-3 acid ethyl esters  1 g Oral Daily   rosuvastatin  10 mg Oral Once per day on Monday Wednesday Friday   sodium chloride flush  3 mL Intravenous Q12H   Continuous Infusions:  sodium chloride     PRN Meds: sodium chloride, acetaminophen **OR** acetaminophen, calamine, diphenhydrAMINE-zinc acetate, fentaNYL (SUBLIMAZE) injection, hydrocortisone cream, hydrOXYzine, ipratropium-albuterol, ondansetron **OR** ondansetron (ZOFRAN) IV, oxyCODONE, polyethylene glycol, sodium chloride flush, traZODone  Time spent: 35 minutes  Author: Gillis Santa. MD [facility] 12/18/2022 8:32 PM  To reach On-call, see care teams to locate the attending and reach out to them via [email or web address]. If 7PM-7AM, please contact night-coverage If you still have difficulty reaching the attending provider, please page the [facility] (Director on Call) for [facility] on amion for assistance.

## 2022-12-18 NOTE — Progress Notes (Signed)
OT Cancellation Note  Patient Details Name: Kyle Roberson MRN: [id number] DOB: 04/30/1940   Cancelled Treatment:    Reason Eval/Treat Not Completed: Pain limiting ability to participate;Fatigue/lethargy limiting ability to participate. Pt defers session, reports pain all over. Spouse reports not sleeping well last night. Will re-attempt as able.   Kathie Dike, M.S. OTR/L  12/18/22, 2:26 PM  ascom [phone]

## 2022-12-18 NOTE — Progress Notes (Signed)
[facility]  ROUNDING NOTE   Subjective:   Patient sitting up in bed Wife at bedside assisting with breakfast Remains on room air, nasal cannula at neck Lower extremity edema improved.    Objective:  Vital signs in last 24 hours:  Temp:  [97.5 F (36.4 C)-98.4 F (36.9 C)] 98.4 F (36.9 C) (09/27 0841) Pulse Rate:  [48-63] 56 (09/27 0841) Resp:  [18-25] 20 (09/27 0841) BP: (129-154)/(45-69) 151/55 (09/27 0841) SpO2:  [96 %-100 %] 99 % (09/27 0841) Weight:  [106.6 kg] 106.6 kg (09/27 0500)  Weight change:  Filed Weights   12/15/22 1000 12/15/22 1849 12/18/22 0500  Weight: 40.3 kg 37.3 kg 106.6 kg    Intake/Output: I/O last 3 completed shifts: In: 480 [P.O.:480] Out: 2.5 [Other:2.5]   Intake/Output this shift:  No intake/output data recorded.  Physical Exam: General: NAD  Head: Normocephalic, atraumatic. Moist oral mucosal membranes  Eyes: Anicteric, ectropion  Lungs:  Diminished, room air  Heart: bradycardia  Abdomen:  Soft, nontender  Extremities:  + peripheral edema.  Neurologic: Alert and oriented, moving all four extremities  Skin: No lesions  Access: Rt Chest permcath    Basic Metabolic Panel: Recent Labs  Lab 12/15/22 0729 12/16/22 0337 12/17/22 0605 12/17/22 0743 12/18/22 0525  NA 138 135 133* 136 136  K 4.9 4.0 4.5 4.6 4.0  CL 102 100 99 98 97*  CO2 25 26 26 25 28   GLUCOSE 113* 89 91 84 100*  BUN 76* 43* 57* 55* 38*  CREATININE 4.67* 2.96* 4.16* 4.09* 2.97*  CALCIUM 8.6* 8.1* 8.4* 8.4* 8.4*  MG  --   --   --  1.9  --   PHOS 7.1* 4.5 6.1* 6.2* 4.4    Liver Function Tests: Recent Labs  Lab 12/14/22 0729 12/15/22 0729 12/16/22 0337 12/17/22 0605 12/18/22 0525  ALBUMIN 3.3* 3.2* 3.1* 3.2* 3.1*   No results for input(s): "LIPASE", "AMYLASE" in the last 168 hours. No results for input(s): "AMMONIA" in the last 168 hours.  CBC: Recent Labs  Lab 12/12/22 0552 12/13/22 0639 12/14/22 0729 12/15/22 0729 12/17/22 0743   WBC 8.5 8.4 8.1 8.1 7.2  HGB 10.1* 10.3* 9.9* 9.5* 9.3*  HCT 31.7* 33.4* 31.0* 30.4* 29.4*  MCV 93.2 96.3 95.4 95.6 95.5  PLT 168 174 170 194 146*    Cardiac Enzymes: No results for input(s): "CKTOTAL", "CKMB", "CKMBINDEX", "TROPONINI" in the last 168 hours.  BNP: Invalid input(s): "POCBNP"  CBG: Recent Labs  Lab 12/17/22 0824 12/17/22 1624 12/17/22 1911 12/17/22 2221 12/18/22 0837  GLUCAP 80 183* 194* 129* 93    Microbiology: Results for orders placed or performed during the hospital encounter of 11/26/22  MRSA Next Gen by PCR, Nasal     Status: None   Collection Time: 11/30/22  4:10 PM   Specimen: Nasal Mucosa; Nasal Swab  Result Value Ref Range Status   MRSA by PCR Next Gen NOT DETECTED NOT DETECTED Final    Comment: (NOTE) The GeneXpert MRSA Assay (FDA approved for NASAL specimens only), is one component of a comprehensive MRSA colonization surveillance program. It is not intended to diagnose MRSA infection nor to guide or monitor treatment for MRSA infections. Test performance is not FDA approved in patients less than 13 years old. Performed at [facility], [address] [place], [place] [address]     Coagulation Studies: No results for input(s): "LABPROT", "INR" in the last 72 hours.   Urinalysis: No results for input(s): "COLORURINE", "LABSPEC", "PHURINE", "  GLUCOSEU", "HGBUR", "BILIRUBINUR", "KETONESUR", "PROTEINUR", "UROBILINOGEN", "NITRITE", "LEUKOCYTESUR" in the last 72 hours.  Invalid input(s): "APPERANCEUR"    Imaging: DG Chest Port 1 View  Result Date: 12/18/2022 CLINICAL DATA:  Shortness of breath. EXAM: PORTABLE CHEST 1 VIEW COMPARISON:  December 03, 2022. FINDINGS: Stable cardiomegaly. Right internal jugular dialysis catheter is in good position. Left lung is clear. Mild right basilar atelectasis and small pleural effusion is noted. Bony thorax is unremarkable. IMPRESSION: Mild right basilar atelectasis and small right pleural  effusion. Electronically Signed   By: Lupita Raider M.D.   On: 12/18/2022 08:48   PERIPHERAL VASCULAR CATHETERIZATION  Result Date: 12/16/2022 See surgical note for result.    Medications:    sodium chloride      apixaban  2.5 mg Oral BID   vitamin C  500 mg Oral BID   Chlorhexidine Gluconate Cloth  6 each Topical Daily   cholecalciferol  2,000 Units Oral Daily   clotrimazole-betamethasone   Topical BID   docusate sodium  100 mg Oral BID   doxazosin  1 mg Oral BID   epoetin (EPOGEN/PROCRIT) injection  4,000 Units Intravenous Q T,Th,Sa-HD   feeding supplement (NEPRO CARB STEADY)  237 mL Oral TID BM   furosemide  80 mg Oral Q M,W,F   heparin sodium (porcine)  4,200 Units Intracatheter Once   insulin aspart  0-15 Units Subcutaneous TID AC & HS   insulin aspart  3 Units Subcutaneous TID WC   multivitamin  1 tablet Oral QHS   neomycin-polymyxin b-dexamethasone  1 Application Right Eye TID   omega-3 acid ethyl esters  1 g Oral Daily   rosuvastatin  10 mg Oral Once per day on Monday Wednesday Friday   sodium chloride flush  3 mL Intravenous Q12H   sodium chloride, acetaminophen **OR** acetaminophen, calamine, diphenhydrAMINE-zinc acetate, fentaNYL (SUBLIMAZE) injection, hydrocortisone cream, hydrOXYzine, ipratropium-albuterol, ondansetron **OR** ondansetron (ZOFRAN) IV, oxyCODONE, polyethylene glycol, sodium chloride flush, traZODone  Assessment/ Plan:  [name] is a 82 y.o.  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 Due to progression of kidney disease and outpatient diuretic therapy failure, we feel this patient  has progressed to end stage renal disease.  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 CRRT from 9/12 to 9/13 Dialysis received yesterday, UF 2.5L achieved  -Appreciate vascular exchanging permcath on 12/16/22 -Next treatment scheduled for Saturday - Continue oral furosemide 80mg  daily on nonHD days - Renal navigator has confirmed outpatient clinic at [facility] on a TTS schedule. This may change depending on rehab facility acceptance.   Acute exacerbation of chronic diastolic congestive heart failure: complicated with acute respiratory failure and pulmonary hypertension. Currently with junctional rhythm. - Room air  -Fluid management with dialysis and diuresis.  Diabetes mellitus type II with chronic kidney disease - holding empagliflozin    LOS: 22 Kyle Roberson 9/27/202410:29 AM

## 2022-12-18 NOTE — Progress Notes (Signed)
  Progress Note    12/18/2022 10:17 AM 2 Days Post-Op  Subjective:  Kyle Roberson is an 82 year old male who is now status postop day 2 from permacath dialysis access exchange.  No complications to the exchange site.  Dressings clean dry and intact no complications overnight vitals all remained stable.   Vitals:   12/18/22 0419 12/18/22 0841  BP: (!) 154/69 (!) 151/55  Pulse: (!) 52 (!) 56  Resp: 18 20  Temp: 98.1 F (36.7 C) 98.4 F (36.9 C)  SpO2: 100% 99%   Physical Exam: Cardiac:  RRR, normal S1-S2, no murmurs appreciated. Lungs: Clear on auscultation but diminished in the bases.  Intermittent rhonchi with some wheezing noted today. Incisions: Right chest incision for permacath placement clean dry intact dressing in place.  No complications to note.  Access is being he has regularly for hemodialysis. Extremities: Palpable pulses throughout. Abdomen: Positive bowel sounds throughout, soft, nontender and nondistended. Neurologic: Alert and oriented x 1 name only this morning.  Unable to answer my questions or follow my commands.  CBC    Component Value Date/Time   WBC 7.2 12/17/2022 0743   RBC 3.08 (L) 12/17/2022 0743   HGB 9.3 (L) 12/17/2022 0743   HGB 12.1 (L) 05/16/2019 1623   HCT 29.4 (L) 12/17/2022 0743   HCT 38.8 05/16/2019 1623   PLT 146 (L) 12/17/2022 0743   PLT 246 05/16/2019 1623   MCV 95.5 12/17/2022 0743   MCV 88 05/16/2019 1623   MCH 30.2 12/17/2022 0743   MCHC 31.6 12/17/2022 0743   RDW 14.7 12/17/2022 0743   RDW 14.5 05/16/2019 1623   LYMPHSABS 0.4 (L) 12/03/2022 0220   LYMPHSABS 1.5 05/16/2019 1623   MONOABS 0.8 12/03/2022 0220   EOSABS 0.3 12/03/2022 0220   EOSABS 0.4 05/16/2019 1623   BASOSABS 0.0 12/03/2022 0220   BASOSABS 0.0 05/16/2019 1623    BMET    Component Value Date/Time   NA 136 12/18/2022 0525   NA 140 07/16/2020 1217   K 4.0 12/18/2022 0525   CL 97 (L) 12/18/2022 0525   CO2 28 12/18/2022 0525   GLUCOSE 100 (H)  12/18/2022 0525   BUN 38 (H) 12/18/2022 0525   BUN 59 (H) 07/16/2020 1217   CREATININE 2.97 (H) 12/18/2022 0525   CALCIUM 8.4 (L) 12/18/2022 0525   GFRNONAA 20 (L) 12/18/2022 0525   GFRAA 33 (L) 10/18/2019 1111    INR    Component Value Date/Time   INR 1.2 12/07/2022 0328     Intake/Output Summary (Last 24 hours) at 12/18/2022 1017 Last data filed at 12/17/2022 1325 Gross per 24 hour  Intake --  Output 2.5 ml  Net -2.5 ml     Assessment/Plan:  82 y.o. male is s/p permacath dialysis access exchange.  2 Days Post-Op   Plan Dialysis access to the right chest in place.  No complications, hematoma seroma or infection today.  Access is working well for hemodialysis.  Vascular surgery to sign off.  If needed in the future please reconsult thank you. DVT prophylaxis: Eliquis 2.5 mg twice daily   Marcie Bal [facility] 12/18/2022 10:17 AM

## 2022-12-18 NOTE — Progress Notes (Signed)
PT Cancellation Note  Patient Details Name: Kyle Roberson MRN: [id number] DOB: 05-24-1940   Cancelled Treatment:    Reason Eval/Treat Not Completed: Patient declined, no reason specified (Treatment session attempted.  Encouraged variety of activities-repositioning, mobility, therex; patient adamantly refusing with slight agitation with continued encouragement.  Will re-attempt at later time/date as medically appropriate and available.)  Of note, patient noted to be resting with nasal cannula removed upon arrival to room; sats 87-88% on RA.  With increased alertness and attention, patient improves to 90-92%.  Refused re-application of nasal cannula.  CNA in for vitals assessment end of session; to continue to monitor O2 and attempt replacement as well.   Amyrah Pinkhasov H. Manson Passey, PT, DPT, NCS 12/18/22, 3:57 PM [phone]

## 2022-12-19 DIAGNOSIS — I5033 Acute on chronic diastolic (congestive) heart failure: Secondary | ICD-10-CM | POA: Diagnosis not present

## 2022-12-19 LAB — GLUCOSE, CAPILLARY
Glucose-Capillary: 140 mg/dL — ABNORMAL HIGH (ref 70–99)
Glucose-Capillary: 142 mg/dL — ABNORMAL HIGH (ref 70–99)
Glucose-Capillary: 199 mg/dL — ABNORMAL HIGH (ref 70–99)
Glucose-Capillary: 224 mg/dL — ABNORMAL HIGH (ref 70–99)
Glucose-Capillary: 305 mg/dL — ABNORMAL HIGH (ref 70–99)

## 2022-12-19 LAB — BASIC METABOLIC PANEL
Anion gap: 10 (ref 5–15)
BUN: 49 mg/dL — ABNORMAL HIGH (ref 8–23)
CO2: 26 mmol/L (ref 22–32)
Calcium: 8.3 mg/dL — ABNORMAL LOW (ref 8.9–10.3)
Chloride: 100 mmol/L (ref 98–111)
Creatinine, Ser: 3.67 mg/dL — ABNORMAL HIGH (ref 0.61–1.24)
GFR, Estimated: 16 mL/min — ABNORMAL LOW (ref >60)
Glucose, Bld: 135 mg/dL — ABNORMAL HIGH (ref 70–99)
Potassium: 3.8 mmol/L (ref 3.5–5.1)
Sodium: 136 mmol/L (ref 135–145)

## 2022-12-19 LAB — CBC
HCT: 33 % — ABNORMAL LOW (ref 39.0–52.0)
HCT: 31.4 % — ABNORMAL LOW (ref 39.0–52.0)
Hemoglobin: 10.1 g/dL — ABNORMAL LOW (ref 13.0–17.0)
Hemoglobin: 9.6 g/dL — ABNORMAL LOW (ref 13.0–17.0)
MCH: 29.5 pg (ref 26.0–34.0)
MCH: 29.8 pg (ref 26.0–34.0)
MCHC: 30.6 g/dL (ref 30.0–36.0)
MCHC: 30.6 g/dL (ref 30.0–36.0)
MCV: 96.5 fL (ref 80.0–100.0)
MCV: 97.5 fL (ref 80.0–100.0)
Platelets: 141 10*3/uL — ABNORMAL LOW (ref 150–400)
Platelets: 142 10*3/uL — ABNORMAL LOW (ref 150–400)
RBC: 3.42 MIL/uL — ABNORMAL LOW (ref 4.22–5.81)
RBC: 3.22 MIL/uL — ABNORMAL LOW (ref 4.22–5.81)
RDW: 14.7 % (ref 11.5–15.5)
RDW: 14.6 % (ref 11.5–15.5)
WBC: 7.9 10*3/uL (ref 4.0–10.5)
WBC: 8 10*3/uL (ref 4.0–10.5)
nRBC: 0 % (ref 0.0–0.2)
nRBC: 0 % (ref 0.0–0.2)

## 2022-12-19 LAB — RENAL FUNCTION PANEL
Albumin: 3.2 g/dL — ABNORMAL LOW (ref 3.5–5.0)
Anion gap: 11 (ref 5–15)
BUN: 51 mg/dL — ABNORMAL HIGH (ref 8–23)
CO2: 27 mmol/L (ref 22–32)
Calcium: 8.3 mg/dL — ABNORMAL LOW (ref 8.9–10.3)
Chloride: 99 mmol/L (ref 98–111)
Creatinine, Ser: 3.87 mg/dL — ABNORMAL HIGH (ref 0.61–1.24)
GFR, Estimated: 15 mL/min — ABNORMAL LOW (ref >60)
Glucose, Bld: 208 mg/dL — ABNORMAL HIGH (ref 70–99)
Phosphorus: 5.5 mg/dL — ABNORMAL HIGH (ref 2.5–4.6)
Potassium: 3.4 mmol/L — ABNORMAL LOW (ref 3.5–5.1)
Sodium: 137 mmol/L (ref 135–145)

## 2022-12-19 LAB — ALBUMIN: Albumin: 3.2 g/dL — ABNORMAL LOW (ref 3.5–5.0)

## 2022-12-19 LAB — PHOSPHORUS: Phosphorus: 5.5 mg/dL — ABNORMAL HIGH (ref 2.5–4.6)

## 2022-12-19 LAB — MAGNESIUM: Magnesium: 1.8 mg/dL (ref 1.7–2.4)

## 2022-12-19 MED ORDER — BUDESONIDE 0.25 MG/2ML IN SUSP
0.2500 mg | Freq: Two times a day (BID) | RESPIRATORY_TRACT | Status: DC
  Administered 2022-12-19 – 2022-12-22 (×5): 0.25 mg via RESPIRATORY_TRACT
  Filled 2022-12-19 (×6): qty 2

## 2022-12-19 MED ORDER — ALTEPLASE 2 MG IJ SOLR: 2.0000 mg | Freq: Once | INTRAMUSCULAR | Status: DC | PRN

## 2022-12-19 MED ORDER — EPOETIN ALFA 4000 UNIT/ML IJ SOLN
INTRAMUSCULAR | Status: AC
  Filled 2022-12-19: qty 1

## 2022-12-19 MED ORDER — ARFORMOTEROL TARTRATE 15 MCG/2ML IN NEBU
15.0000 ug | INHALATION_SOLUTION | Freq: Two times a day (BID) | RESPIRATORY_TRACT | Status: DC
  Administered 2022-12-19 – 2022-12-22 (×5): 15 ug via RESPIRATORY_TRACT
  Filled 2022-12-19 (×7): qty 2

## 2022-12-19 MED ORDER — HEPARIN SODIUM (PORCINE) 1000 UNIT/ML IJ SOLN
INTRAMUSCULAR | Status: AC
  Filled 2022-12-19: qty 10

## 2022-12-19 MED ORDER — HEPARIN SODIUM (PORCINE) 1000 UNIT/ML DIALYSIS
1000.0000 [IU] | INTRAMUSCULAR | Status: DC | PRN
  Administered 2022-12-19: 3200 [IU]
  Filled 2022-12-19: qty 1

## 2022-12-19 NOTE — Progress Notes (Signed)
  Received patient in bed to unit.    Informed consent signed and in chart.    TX duration: 3.5 hours     Transported back to floor  Hand-off given to patient's nurse.    Access used: R jugular  Access issues: none   Total UF removed: 3L Medication(s) given: epogen Post HD VS: 133/52 Post HD weight: unable to obtain due to bed      Olin Pia, LPN [facility]

## 2022-12-19 NOTE — Progress Notes (Signed)
[facility]  ROUNDING NOTE   Subjective:   Patient seen and evaluated during HD.  UF target 2.5kg today. Resting comfortably in bed.    Objective:  Vital signs in last 24 hours:  Temp:  [97.6 F (36.4 C)-98.4 F (36.9 C)] 97.6 F (36.4 C) (09/28 0854) Pulse Rate:  [49-71] 53 (09/28 0930) Resp:  [18-25] 23 (09/28 0930) BP: (117-164)/(52-67) 142/64 (09/28 0930) SpO2:  [91 %-100 %] 100 % (09/28 0930)  Weight change:  Filed Weights   12/15/22 1000 12/15/22 1849 12/18/22 0500  Weight: 40.3 kg 37.3 kg 106.6 kg    Intake/Output: I/O last 3 completed shifts: In: 320 [P.O.:320] Out: 300 [Urine:300]   Intake/Output this shift:  No intake/output data recorded.  Physical Exam: General: NAD  Head: Normocephalic, atraumatic. Moist oral mucosal membranes  Eyes: Anicteric, ectropion  Lungs:  Diminished, room air  Heart: S1S2 no rubs  Abdomen:  Soft, nontender  Extremities: 2+ peripheral edema.  Neurologic: Awake, alert  Skin: No lesions  Access: Rt Chest permcath    Basic Metabolic Panel: Recent Labs  Lab 12/17/22 0605 12/17/22 0743 12/18/22 0525 12/19/22 0723 12/19/22 0903  NA 133* 136 136 136 137  K 4.5 4.6 4.0 3.8 3.4*  CL 99 98 97* 100 99  CO2 26 25 28 26 27   GLUCOSE 91 84 100* 135* 208*  BUN 57* 55* 38* 49* 51*  CREATININE 4.16* 4.09* 2.97* 3.67* 3.87*  CALCIUM 8.4* 8.4* 8.4* 8.3* 8.3*  MG  --  1.9  --  1.8  --   PHOS 6.1* 6.2* 4.4 5.5* 5.5*    Liver Function Tests: Recent Labs  Lab 12/16/22 0337 12/17/22 0605 12/18/22 0525 12/19/22 0723 12/19/22 0903  ALBUMIN 3.1* 3.2* 3.1* 3.2* 3.2*   No results for input(s): "LIPASE", "AMYLASE" in the last 168 hours. No results for input(s): "AMMONIA" in the last 168 hours.  CBC: Recent Labs  Lab 12/14/22 0729 12/15/22 0729 12/17/22 0743 12/19/22 0723 12/19/22 0903  WBC 8.1 8.1 7.2 7.9 8.0  HGB 9.9* 9.5* 9.3* 10.1* 9.6*  HCT 31.0* 30.4* 29.4* 33.0* 31.4*  MCV 95.4 95.6 95.5 96.5 97.5   PLT 170 194 146* 141* 142*    Cardiac Enzymes: No results for input(s): "CKTOTAL", "CKMB", "CKMBINDEX", "TROPONINI" in the last 168 hours.  BNP: Invalid input(s): "POCBNP"  CBG: Recent Labs  Lab 12/18/22 0837 12/18/22 1137 12/18/22 1541 12/18/22 2009 12/19/22 0824  GLUCAP 93 242* 182* 187* 140*    Microbiology: Results for orders placed or performed during the hospital encounter of 11/26/22  MRSA Next Gen by PCR, Nasal     Status: None   Collection Time: 11/30/22  4:10 PM   Specimen: Nasal Mucosa; Nasal Swab  Result Value Ref Range Status   MRSA by PCR Next Gen NOT DETECTED NOT DETECTED Final    Comment: (NOTE) The GeneXpert MRSA Assay (FDA approved for NASAL specimens only), is one component of a comprehensive MRSA colonization surveillance program. It is not intended to diagnose MRSA infection nor to guide or monitor treatment for MRSA infections. Test performance is not FDA approved in patients less than 84 years old. Performed at [facility], [address] [place], [place] [address]     Coagulation Studies: No results for input(s): "LABPROT", "INR" in the last 72 hours.   Urinalysis: No results for input(s): "COLORURINE", "LABSPEC", "PHURINE", "GLUCOSEU", "HGBUR", "BILIRUBINUR", "KETONESUR", "PROTEINUR", "UROBILINOGEN", "NITRITE", "LEUKOCYTESUR" in the last 72 hours.  Invalid input(s): "APPERANCEUR"    Imaging: DG  Chest Port 1 View  Result Date: 12/18/2022 CLINICAL DATA:  Shortness of breath. EXAM: PORTABLE CHEST 1 VIEW COMPARISON:  December 03, 2022. FINDINGS: Stable cardiomegaly. Right internal jugular dialysis catheter is in good position. Left lung is clear. Mild right basilar atelectasis and small pleural effusion is noted. Bony thorax is unremarkable. IMPRESSION: Mild right basilar atelectasis and small right pleural effusion. Electronically Signed   By: Lupita Raider M.D.   On: 12/18/2022 08:48     Medications:    sodium chloride       apixaban  2.5 mg Oral BID   vitamin C  500 mg Oral BID   Chlorhexidine Gluconate Cloth  6 each Topical Daily   cholecalciferol  2,000 Units Oral Daily   clotrimazole-betamethasone   Topical BID   docusate sodium  100 mg Oral BID   doxazosin  1 mg Oral BID   epoetin (EPOGEN/PROCRIT) injection  4,000 Units Intravenous Q T,Th,Sa-HD   feeding supplement (NEPRO CARB STEADY)  237 mL Oral TID BM   furosemide  80 mg Oral Q M,W,F   heparin sodium (porcine)  4,200 Units Intracatheter Once   insulin aspart  0-15 Units Subcutaneous TID AC & HS   insulin aspart  3 Units Subcutaneous TID WC   multivitamin  1 tablet Oral QHS   neomycin-polymyxin b-dexamethasone  1 Application Right Eye TID   omega-3 acid ethyl esters  1 g Oral Daily   rosuvastatin  10 mg Oral Once per day on Monday Wednesday Friday   sodium chloride flush  3 mL Intravenous Q12H   sodium chloride, acetaminophen **OR** acetaminophen, alteplase, calamine, diphenhydrAMINE-zinc acetate, fentaNYL (SUBLIMAZE) injection, heparin, hydrocortisone cream, hydrOXYzine, ipratropium-albuterol, ondansetron **OR** ondansetron (ZOFRAN) IV, oxyCODONE, polyethylene glycol, sodium chloride flush, traZODone  Assessment/ Plan:  [name] is a 82 y.o.  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 Due to progression of kidney disease and outpatient diuretic therapy failure, we feel this patient has progressed to end stage renal disease.  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 CRRT from 9/12 to 9/13 Patient seen and evaluated during hemodialysis treatment today.  Tolerating well.  UF  target 2.5 kg.  Outpatient hemodialysis center has been secured at [facility].  This may change depending upon where he goes for rehabilitation.  Acute exacerbation of chronic diastolic congestive heart failure: complicated with acute respiratory failure and pulmonary hypertension. Currently with junctional rhythm. -Continue UF with dialysis as well as diuretic treatments.  Diabetes mellitus type II with chronic kidney disease - holding empagliflozin  4.  Anemia of chronic kidney disease.  Hemoglobin 9.6.  Continue Epogen 4000 units with dialysis treatments.  5.  Secondary hyperparathyroidism.  Most recent serum phosphorus was 5.5.  Continue to monitor bone metabolism parameters.   LOS: 23 Lazette Estala 9/28/20249:40 AM

## 2022-12-19 NOTE — Plan of Care (Signed)
  Problem: Education: Goal: Ability to describe self-care measures that may prevent or decrease complications (Diabetes Survival Skills Education) will improve Outcome: Progressing Goal: Individualized Educational Video(s) Outcome: Progressing   Problem: Coping: Goal: Ability to adjust to condition or change in health will improve Outcome: Progressing   Problem: Fluid Volume: Goal: Ability to maintain a balanced intake and output will improve Outcome: Progressing   Problem: Health Behavior/Discharge Planning: Goal: Ability to identify and utilize available resources and services will improve Outcome: Progressing Goal: Ability to manage health-related needs will improve Outcome: Progressing   Problem: Metabolic: Goal: Ability to maintain appropriate glucose levels will improve Outcome: Progressing   Problem: Nutritional: Goal: Maintenance of adequate nutrition will improve Outcome: Progressing Goal: Progress toward achieving an optimal weight will improve Outcome: Progressing   Problem: Skin Integrity: Goal: Risk for impaired skin integrity will decrease Outcome: Progressing   Problem: Tissue Perfusion: Goal: Adequacy of tissue perfusion will improve Outcome: Progressing   Problem: Education: Goal: Knowledge of General Education information will improve Description: Including pain rating scale, medication(s)/side effects and non-pharmacologic comfort measures Outcome: Progressing   Problem: Health Behavior/Discharge Planning: Goal: Ability to manage health-related needs will improve Outcome: Progressing   Problem: Clinical Measurements: Goal: Ability to maintain clinical measurements within normal limits will improve Outcome: Progressing Goal: Will remain free from infection Outcome: Progressing Goal: Diagnostic test results will improve Outcome: Progressing Goal: Respiratory complications will improve Outcome: Progressing Goal: Cardiovascular complication will  be avoided Outcome: Progressing   Problem: Activity: Goal: Risk for activity intolerance will decrease Outcome: Progressing   Problem: Nutrition: Goal: Adequate nutrition will be maintained Outcome: Progressing   Problem: Coping: Goal: Level of anxiety will decrease Outcome: Progressing   Problem: Elimination: Goal: Will not experience complications related to bowel motility Outcome: Progressing Goal: Will not experience complications related to urinary retention Outcome: Progressing   Problem: Pain Managment: Goal: General experience of comfort will improve Outcome: Progressing   Problem: Safety: Goal: Ability to remain free from injury will improve Outcome: Progressing   Problem: Skin Integrity: Goal: Risk for impaired skin integrity will decrease Outcome: Progressing   Problem: Education: Goal: Understanding of CV disease, CV risk reduction, and recovery process will improve Outcome: Progressing Goal: Individualized Educational Video(s) Outcome: Progressing   Problem: Activity: Goal: Ability to return to baseline activity level will improve Outcome: Progressing   Problem: Cardiovascular: Goal: Ability to achieve and maintain adequate cardiovascular perfusion will improve Outcome: Progressing Goal: Vascular access site(s) Level 0-1 will be maintained Outcome: Progressing   Problem: Health Behavior/Discharge Planning: Goal: Ability to safely manage health-related needs after discharge will improve Outcome: Progressing   Problem: Education: Goal: Knowledge of disease and its progression will improve Outcome: Progressing   Problem: Fluid Volume: Goal: Compliance with measures to maintain balanced fluid volume will improve Outcome: Progressing   Problem: Health Behavior/Discharge Planning: Goal: Ability to manage health-related needs will improve Outcome: Progressing   Problem: Nutritional: Goal: Ability to make healthy dietary choices will  improve Outcome: Progressing   Problem: Clinical Measurements: Goal: Complications related to the disease process, condition or treatment will be avoided or minimized Outcome: Progressing

## 2022-12-19 NOTE — Progress Notes (Signed)
SLP Cancellation Note  Patient Details Name: Kyle Roberson MRN: [id number] DOB: 09-17-1940   Cancelled treatment:       Reason Eval/Treat Not Completed: Patient at procedure or test/unavailable (Pt OTF for dialysis. Will continue efforts as appropriate.)  Kyle Roberson, M.S., CCC-SLP Speech-Language Pathologist [facility] [phone] Arnette Felts)  [name] 12/19/2022, 10:08 AM

## 2022-12-19 NOTE — Progress Notes (Signed)
[facility] Progress Note  Patient: Kyle Roberson    VHQ:[id number]  DOA: 11/26/2022     Date of Service: the patient was seen and examined on 12/19/2022  No chief complaint on file.  Brief hospital course: 82yo with h/o CAD, stage 5 CKD, chronic diastolic CHF, BLE lymphedema, afib on Eliquis, PAH, and SDH presented on 09/05 with weight gain and SOB.     Hospital course / major events:  He was diagnosed with acute on chronic CHF and started on Lasix drip.  Cardiology was consulted. Echo this admission with EF 55-60%, mild LVH, moderate RV enlargement with mildly decreased RV systolic function, PASP 73 mmHg, severe LAE, mild-moderate TR, IVC dilated.  IV midodrine drip 09/09.   RHC done on 09/11 with elevated R > L filling pressures, mostly right-sided, worsening renal function and decreased UOP so Lasix stopped.  Started on CRRT 09/12 for additional volume removal and completed this. 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  He is on IV Lasix.  Remains on heparin gtt for Afib, has been bradycardic w/ ventricular rates 40-50s but go into 60-70 range on standing. 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 Bradycardic currently Apixaban was held and patient was on heparin which was discontinued and apixaban 2.5 mg p.o. twice daily was resumed.   No role for PPM currently in light of need for HD and additional nidus for blood stream infection    Anemia of chronic inflammation, cardiorenal disease ESRD Continue Epogen 4000 units every TTS on hemodialysis days as per nephrology.      Hyperphosphatemia Binders per nephro   L ankle pain Fell in the ER Xray ordered and negative CAM walker for use with ambulation   Hypertension Continue doxazosin, hydralazine   Hyperlipidemia Continue rosuvastatin, Lovaza   CAD with remote PCI Continue statin No ASA Imdur was discontinued by cardiologist   History of paroxysmal a fib Bradycardia currently so not on rate controlling medications Resume eliquis    Bilateral lower extremity lymphedema, chronic Chronic skin changes, dermatitis and possible fungal infection 9/25 Started Lotrisone cream twice daily   Goals  of care Patient appears disinterested in aggressive care but his wife is insistent She has declined palliative care consultation, "he's not there yet" He has a very poor overall prognosis based on cardiorenal failure and is not a good long-term HD  candidate Ongoing discussions are encouraged regarding code status as well as possibly EOL care    Morbid obesity Complicates overall prognosis Body mass index is 35.73 kg/m.  Nutrition Problem: Increased nutrient needs Etiology: acute illness (CRRT) Interventions: Nutrition Problem: Increased nutrient needs   Pressure Injury 12/03/22 Buttocks Right;Left Stage 1 -  Intact skin with non-blanchable redness of a localized area usually over a bony prominence. Purple/blue (Active)  12/03/22 0820  Location: Buttocks  Location Orientation: Right;Left  Staging: Stage 1 -  Intact skin with non-blanchable redness of a localized area usually over a bony prominence.  Wound Description (Comments): Purple/blue  Present on Admission:   Dressing Type Foam - Lift dressing to assess site every shift 12/19/22 0801     Diet: Renal diet DVT Prophylaxis: Therapeutic Anticoagulation with Eliquis    Advance goals of care discussion: Full code  Family Communication: family was present at bedside, at the time of interview.  The pt provided permission to discuss medical plan with the family. Opportunity was given to ask question and all questions were answered satisfactorily.   Disposition:  Pt is from home, admitted with renal failure, developed ESRD, started hemodialysis, still volume overload, which precludes a safe discharge. Discharge to SNF, when bed will be available.    Subjective: No significant events overnight, patient was seen during hemodialysis, denied any complaint.  Patient still with shortness of breath but he is not able to complain about it.  Lower extremity edema is improving, patient denied any other complaint.  Patient did agree for nebulizer treatment. Discussed with patient's wife over the phone.   Physical Exam: General: NAD, lying comfortably Appear in no distress, affect appropriate Eyes: PERRLA ENT: Oral Mucosa Clear, moist  Neck: no JVD,  Cardiovascular: S1 and S2  Present, no Murmur, bradycardic Respiratory: Equal air entry bilaterally, bilateral crackles, mild wheezing  Abdomen: Bowel Sound present, Soft and no tenderness,  Skin: no rashes Extremities: Chronic lymphedema bilateral lower extremities, with dry and scaly skin. no calf tenderness Neurologic: without any new focal findings Gait not checked due to patient safety concerns  Vitals:   12/19/22 1200 12/19/22 1230 12/19/22 1245 12/19/22 1337  BP: (!) 121/57 130/66 (!) 111/55 (!) 129/51  Pulse: (!) 56 (!) 59 (!) 59 (!) 50  Resp: (!) 25 (!) 24 (!) 25   Temp:   (!) 97 F (36.1 C) 97.8 F (36.6 C)  TempSrc:   Axillary Oral  SpO2: 100% 100% 100% 100%  Weight:      Height:        Intake/Output Summary (Last 24 hours) at 12/19/2022 1534 Last data filed at 12/19/2022 1245 Gross per 24 hour  Intake 200 ml  Output 303 ml  Net -103 ml   Filed Weights   12/15/22 1000 12/15/22 1849 12/18/22 0500  Weight: 40.3 kg 37.3 kg 106.6 kg    Data Reviewed: I have personally reviewed and interpreted daily labs, tele strips, imagings as discussed above. I reviewed all nursing notes, pharmacy notes, vitals, pertinent old records I have discussed plan of care as described above with RN and patient/family.  CBC: Recent Labs  Lab 12/14/22 0729 12/15/22 0729 12/17/22 0743 12/19/22 0723 12/19/22 0903  WBC 8.1 8.1 7.2 7.9 8.0  HGB 9.9*  9.5* 9.3* 10.1* 9.6*  HCT 31.0* 30.4* 29.4* 33.0* 31.4*  MCV 95.4 95.6 95.5 96.5 97.5  PLT 170 194 146* 141* 142*   Basic Metabolic Panel: Recent Labs  Lab 12/17/22 0605 12/17/22 0743 12/18/22 0525 12/19/22 0723 12/19/22 0903  NA 133* 136 136 136 137  K 4.5 4.6 4.0 3.8 3.4*  CL 99 98 97* 100 99  CO2 26 25 28 26 27   GLUCOSE 91 84 100* 135* 208*  BUN 57* 55* 38* 49* 51*  CREATININE 4.16* 4.09* 2.97* 3.67* 3.87*  CALCIUM 8.4* 8.4* 8.4* 8.3* 8.3*  MG  --  1.9  --  1.8  --   PHOS 6.1* 6.2* 4.4 5.5* 5.5*    Studies: No results found.  Scheduled Meds:   apixaban  2.5 mg Oral BID   arformoterol  15 mcg Nebulization BID   vitamin C  500 mg Oral BID   budesonide (PULMICORT) nebulizer solution  0.25 mg Nebulization BID   Chlorhexidine Gluconate Cloth  6 each Topical Daily   cholecalciferol  2,000 Units Oral Daily   clotrimazole-betamethasone   Topical BID   docusate sodium  100 mg Oral BID   doxazosin  1 mg Oral BID   epoetin (EPOGEN/PROCRIT) injection  4,000 Units Intravenous Q T,Th,Sa-HD   feeding supplement (NEPRO CARB STEADY)  237 mL Oral TID BM   furosemide  80 mg Oral Q M,W,F   heparin sodium (porcine)  4,200 Units Intracatheter Once   insulin aspart  0-15 Units Subcutaneous TID AC & HS   insulin aspart  3 Units Subcutaneous TID WC   multivitamin  1 tablet Oral QHS   neomycin-polymyxin b-dexamethasone  1 Application Right Eye TID   omega-3 acid ethyl esters  1 g Oral Daily   rosuvastatin  10 mg Oral Once per day on Monday Wednesday Friday   sodium chloride flush  3 mL Intravenous Q12H   Continuous Infusions:  sodium chloride     PRN Meds: sodium chloride, acetaminophen **OR** acetaminophen, calamine, diphenhydrAMINE-zinc acetate, fentaNYL (SUBLIMAZE) injection, hydrocortisone cream, hydrOXYzine, ipratropium-albuterol, ondansetron **OR** ondansetron (ZOFRAN) IV, oxyCODONE, polyethylene glycol, sodium chloride flush, traZODone  Time spent: 35 minutes  Author: Gillis Santa. MD [facility] 12/19/2022 3:34 PM  To reach On-call, see care teams to locate the attending and reach out to them via [email or web address]. If 7PM-7AM, please contact night-coverage If you still have difficulty reaching the attending provider, please page the [facility] (Director on Call) for [facility] on amion for assistance.

## 2022-12-20 ENCOUNTER — Inpatient Hospital Stay: Payer: Medicare HMO

## 2022-12-20 DIAGNOSIS — I5033 Acute on chronic diastolic (congestive) heart failure: Secondary | ICD-10-CM | POA: Diagnosis not present

## 2022-12-20 LAB — GLUCOSE, CAPILLARY
Glucose-Capillary: 136 mg/dL — ABNORMAL HIGH (ref 70–99)
Glucose-Capillary: 192 mg/dL — ABNORMAL HIGH (ref 70–99)
Glucose-Capillary: 273 mg/dL — ABNORMAL HIGH (ref 70–99)

## 2022-12-20 LAB — RENAL FUNCTION PANEL
Albumin: 3 g/dL — ABNORMAL LOW (ref 3.5–5.0)
Anion gap: 8 (ref 5–15)
BUN: 31 mg/dL — ABNORMAL HIGH (ref 8–23)
CO2: 26 mmol/L (ref 22–32)
Calcium: 8 mg/dL — ABNORMAL LOW (ref 8.9–10.3)
Chloride: 101 mmol/L (ref 98–111)
Creatinine, Ser: 2.8 mg/dL — ABNORMAL HIGH (ref 0.61–1.24)
GFR, Estimated: 22 mL/min — ABNORMAL LOW (ref >60)
Glucose, Bld: 139 mg/dL — ABNORMAL HIGH (ref 70–99)
Phosphorus: 3.6 mg/dL (ref 2.5–4.6)
Potassium: 3.8 mmol/L (ref 3.5–5.1)
Sodium: 135 mmol/L (ref 135–145)

## 2022-12-20 LAB — BASIC METABOLIC PANEL
Anion gap: 12 (ref 5–15)
BUN: 43 mg/dL — ABNORMAL HIGH (ref 8–23)
CO2: 25 mmol/L (ref 22–32)
Calcium: 8.6 mg/dL — ABNORMAL LOW (ref 8.9–10.3)
Chloride: 99 mmol/L (ref 98–111)
Creatinine, Ser: 3.69 mg/dL — ABNORMAL HIGH (ref 0.61–1.24)
GFR, Estimated: 16 mL/min — ABNORMAL LOW (ref >60)
Glucose, Bld: 299 mg/dL — ABNORMAL HIGH (ref 70–99)
Potassium: 4.3 mmol/L (ref 3.5–5.1)
Sodium: 136 mmol/L (ref 135–145)

## 2022-12-20 LAB — D-DIMER, QUANTITATIVE: D-Dimer, Quant: 2.04 ug{FEU}/mL — ABNORMAL HIGH (ref 0.00–0.50)

## 2022-12-20 LAB — CBC
HCT: 32 % — ABNORMAL LOW (ref 39.0–52.0)
Hemoglobin: 10.1 g/dL — ABNORMAL LOW (ref 13.0–17.0)
MCH: 30.2 pg (ref 26.0–34.0)
MCHC: 31.6 g/dL (ref 30.0–36.0)
MCV: 95.8 fL (ref 80.0–100.0)
Platelets: 163 10*3/uL (ref 150–400)
RBC: 3.34 MIL/uL — ABNORMAL LOW (ref 4.22–5.81)
RDW: 14.6 % (ref 11.5–15.5)
WBC: 6.4 10*3/uL (ref 4.0–10.5)
nRBC: 0 % (ref 0.0–0.2)

## 2022-12-20 LAB — MAGNESIUM: Magnesium: 1.7 mg/dL (ref 1.7–2.4)

## 2022-12-20 LAB — TROPONIN I (HIGH SENSITIVITY): Troponin I (High Sensitivity): 24 ng/L — ABNORMAL HIGH (ref <18)

## 2022-12-20 LAB — PROTIME-INR
INR: 1.5 — ABNORMAL HIGH (ref 0.8–1.2)
Prothrombin Time: 18 s — ABNORMAL HIGH (ref 11.4–15.2)

## 2022-12-20 MED ORDER — PREDNISONE 20 MG PO TABS
40.0000 mg | ORAL_TABLET | Freq: Every day | ORAL | Status: AC
  Administered 2022-12-22 – 2022-12-26 (×5): 40 mg via ORAL
  Filled 2022-12-20 (×5): qty 2

## 2022-12-20 MED ORDER — PANTOPRAZOLE SODIUM 40 MG IV SOLR
40.0000 mg | Freq: Two times a day (BID) | INTRAVENOUS | Status: AC
  Administered 2022-12-20 – 2022-12-22 (×6): 40 mg via INTRAVENOUS
  Filled 2022-12-20 (×6): qty 10

## 2022-12-20 MED ORDER — CALCIUM CARBONATE ANTACID 500 MG PO CHEW
1.0000 | CHEWABLE_TABLET | Freq: Three times a day (TID) | ORAL | Status: DC
  Administered 2022-12-20 – 2022-12-22 (×3): 200 mg via ORAL
  Filled 2022-12-20 (×4): qty 1

## 2022-12-20 MED ORDER — METHYLPREDNISOLONE SODIUM SUCC 40 MG IJ SOLR
40.0000 mg | Freq: Two times a day (BID) | INTRAMUSCULAR | Status: AC
  Administered 2022-12-20 – 2022-12-21 (×4): 40 mg via INTRAVENOUS
  Filled 2022-12-20 (×4): qty 1

## 2022-12-20 MED ORDER — NITROGLYCERIN 0.4 MG SL SUBL
SUBLINGUAL_TABLET | SUBLINGUAL | Status: AC
  Filled 2022-12-20: qty 1

## 2022-12-20 MED ORDER — MORPHINE SULFATE (PF) 2 MG/ML IV SOLN: 2.0000 mg | INTRAVENOUS | Status: DC | PRN

## 2022-12-20 MED ORDER — CALCIUM CARBONATE ANTACID 500 MG PO CHEW: 1.0000 | CHEWABLE_TABLET | Freq: Three times a day (TID) | ORAL | Status: DC | PRN

## 2022-12-20 MED ORDER — IPRATROPIUM-ALBUTEROL 0.5-2.5 (3) MG/3ML IN SOLN
3.0000 mL | Freq: Four times a day (QID) | RESPIRATORY_TRACT | Status: DC
  Administered 2022-12-20 – 2022-12-21 (×3): 3 mL via RESPIRATORY_TRACT
  Filled 2022-12-20 (×4): qty 3

## 2022-12-20 MED ORDER — NITROGLYCERIN 0.4 MG SL SUBL
0.4000 mg | SUBLINGUAL_TABLET | SUBLINGUAL | Status: DC | PRN
  Administered 2022-12-20 (×2): 0.4 mg via SUBLINGUAL
  Filled 2022-12-20: qty 1

## 2022-12-20 MED ORDER — PANTOPRAZOLE SODIUM 40 MG PO TBEC
40.0000 mg | DELAYED_RELEASE_TABLET | Freq: Every day | ORAL | Status: DC
  Administered 2022-12-23 – 2023-01-04 (×13): 40 mg via ORAL
  Filled 2022-12-20 (×13): qty 1

## 2022-12-20 NOTE — Progress Notes (Signed)
CROSS COVER NOTE  NAME: Kyle Roberson MRN: [id number] DOB : 11/26/1940    Concern as stated by nurse / staff   atient admitted 9/5 CHF exascerbation. Has since transferred to and from the ICU, CRRT now HD T. TH. SAT. 3L fluids pulled on Saturday. Currently C/O chest pressure 7/10. VS 163/78, 88, 100% on 4L from 2L, temp 97.8, preparing to obtain an EKG. No nitro or morphine on profile. Pls advise. Thanks      Pertinent findings on chart review: 82yo with h/o CAD, stage 5 CKD, chronic diastolic CHF, BLE lymphedema, afib on Eliquis, PAH, and SDH presented on 09/05 with weight gain and SOB with working diagnosis of respiratory failure secondary to CHF and COPD ex sedations as well as cardiorenal syndrome, now on hemodialysis.  Last progress note and med history reviewed as follows "CAD with remote PCI Continue statin No ASA Imdur was discontinued by cardiologist"     12/20/2022    7:16 PM 12/20/2022    5:32 PM 12/20/2022   12:25 PM  Vitals with BMI  Systolic 128 136 782  Diastolic 64 58 57  Pulse 57 48 52        Assessment and  Interventions   Assessment:  Chest pressure with hx  of CAD and PCI not on nitrates.  On eliquis for A fib   Plan: NTG SL prn chest pain, may add nitropaste. Morphine for breakthrough Troponins CXR

## 2022-12-20 NOTE — Progress Notes (Signed)
SLP Cancellation Note  Patient Details Name: Kyle Roberson MRN: [id number] DOB: 04-Jun-1940   Cancelled treatment:       Reason Eval/Treat Not Completed:  (chart reviewed; consulted NSG then met w/ pt and Wife in room.)  Pt and Wife denied any difficulty swallowing w/ meals, pills; pt is currently on a regular diet; tolerates swallowing pills w/ water per NSG and Wife -- "a whole handful at a time". Discussed general aspiration precautions, also in setting of pill swallowing. Noted labs, WBC WNL.  Pt conversed in basic conversation w/out gross expressive/receptive deficits noted; pt and Wife denied any change in his speech-language abilities. No deficits in his speech and communication w/ others were reported by Wife or NSG. Speech intelligible, but he did appear weak overall (reduced effort). Wife and pt did not feel an assessment was necessary at this time. NSG denied any overt swallowing issues as well, and pt is verbalizing his wants/needs to others. Pt agreed.  ST services can be available if needed; MDNSG to reconsult if any change in status while admitted. Wife and pt agreed.     Jerilynn Som, MS, CCC-SLP Speech Language Pathologist Rehab Services; [facility] [phone] (ascom) Kailan Laws 12/20/2022, 5:39 PM

## 2022-12-20 NOTE — Plan of Care (Signed)
  Problem: Education: Goal: Ability to describe self-care measures that may prevent or decrease complications (Diabetes Survival Skills Education) will improve Outcome: Progressing Goal: Individualized Educational Video(s) Outcome: Progressing   Problem: Coping: Goal: Ability to adjust to condition or change in health will improve Outcome: Progressing   Problem: Fluid Volume: Goal: Ability to maintain a balanced intake and output will improve Outcome: Progressing   Problem: Health Behavior/Discharge Planning: Goal: Ability to identify and utilize available resources and services will improve Outcome: Progressing Goal: Ability to manage health-related needs will improve Outcome: Progressing   Problem: Metabolic: Goal: Ability to maintain appropriate glucose levels will improve Outcome: Progressing   Problem: Nutritional: Goal: Maintenance of adequate nutrition will improve Outcome: Progressing Goal: Progress toward achieving an optimal weight will improve Outcome: Progressing   Problem: Skin Integrity: Goal: Risk for impaired skin integrity will decrease Outcome: Progressing   Problem: Tissue Perfusion: Goal: Adequacy of tissue perfusion will improve Outcome: Progressing   Problem: Education: Goal: Knowledge of General Education information will improve Description: Including pain rating scale, medication(s)/side effects and non-pharmacologic comfort measures Outcome: Progressing   Problem: Health Behavior/Discharge Planning: Goal: Ability to manage health-related needs will improve Outcome: Progressing   Problem: Clinical Measurements: Goal: Ability to maintain clinical measurements within normal limits will improve Outcome: Progressing Goal: Will remain free from infection Outcome: Progressing Goal: Diagnostic test results will improve Outcome: Progressing Goal: Respiratory complications will improve Outcome: Progressing Goal: Cardiovascular complication will  be avoided Outcome: Progressing   Problem: Activity: Goal: Risk for activity intolerance will decrease Outcome: Progressing   Problem: Nutrition: Goal: Adequate nutrition will be maintained Outcome: Progressing   Problem: Coping: Goal: Level of anxiety will decrease Outcome: Progressing   Problem: Elimination: Goal: Will not experience complications related to bowel motility Outcome: Progressing Goal: Will not experience complications related to urinary retention Outcome: Progressing   Problem: Pain Managment: Goal: General experience of comfort will improve Outcome: Progressing   Problem: Safety: Goal: Ability to remain free from injury will improve Outcome: Progressing   Problem: Skin Integrity: Goal: Risk for impaired skin integrity will decrease Outcome: Progressing   Problem: Education: Goal: Understanding of CV disease, CV risk reduction, and recovery process will improve Outcome: Progressing Goal: Individualized Educational Video(s) Outcome: Progressing   Problem: Activity: Goal: Ability to return to baseline activity level will improve Outcome: Progressing   Problem: Cardiovascular: Goal: Ability to achieve and maintain adequate cardiovascular perfusion will improve Outcome: Progressing Goal: Vascular access site(s) Level 0-1 will be maintained Outcome: Progressing   Problem: Health Behavior/Discharge Planning: Goal: Ability to safely manage health-related needs after discharge will improve Outcome: Progressing   Problem: Education: Goal: Knowledge of disease and its progression will improve Outcome: Progressing   Problem: Fluid Volume: Goal: Compliance with measures to maintain balanced fluid volume will improve Outcome: Progressing   Problem: Health Behavior/Discharge Planning: Goal: Ability to manage health-related needs will improve Outcome: Progressing   Problem: Nutritional: Goal: Ability to make healthy dietary choices will  improve Outcome: Progressing   Problem: Clinical Measurements: Goal: Complications related to the disease process, condition or treatment will be avoided or minimized Outcome: Progressing

## 2022-12-20 NOTE — Progress Notes (Signed)
PT Cancellation Note  Patient Details Name: Kyle Roberson MRN: [id number] DOB: [birth year]   Cancelled Treatment:    Reason Eval/Treat Not Completed: Other (comment) Pt received in bed, wife present at bedside & provides encouragement throughout. PT offered to assist pt to recliner, sit EOB, or perform BLE exercises from bed level with pt falling asleep throughout PT/wife talking to him. Pt declines all PT efforts despite wife encouraging as well. Pt left in bed with wife present. Will f/u as able.  Aleda Grana, PT, DPT 12/20/22, 10:38 AM   Sandi Mariscal 12/20/2022, 10:37 AM

## 2022-12-20 NOTE — Progress Notes (Signed)
[facility] Progress Note  Patient: Kyle Roberson    WUJ:[id number]  DOA: 11/26/2022     Date of Service: the patient was seen and examined on 12/20/2022  No chief complaint on file.  Brief hospital course: 81yo with h/o CAD, stage 5 CKD, chronic diastolic CHF, BLE lymphedema, afib on Eliquis, PAH, and SDH presented on 09/05 with weight gain and SOB.     Hospital course / major events:  He was diagnosed with acute on chronic CHF and started on Lasix drip.  Cardiology was consulted. Echo this admission with EF 55-60%, mild LVH, moderate RV enlargement with mildly decreased RV systolic function, PASP 73 mmHg, severe LAE, mild-moderate TR, IVC dilated.  IV midodrine drip 09/09.   RHC done on 09/11 with elevated R > L filling pressures, mostly right-sided, worsening renal function and decreased UOP so Lasix stopped.  Started on CRRT 09/12 for additional volume removal and completed this. 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  He is on IV Lasix.  Remains on heparin gtt for Afib, has been bradycardic w/ ventricular rates 40-50s but go into 60-70 range on standing. Resumed Eliquis 09/24 Permcath placement 12/14/22 and now pending outpatient placement and HD plan    Consultants:  Cardiology - advanced HF team  Nephrology  PCCM Palliative care Surgery PT/OT [facility] team   Procedures: Echocardiogram 9/6 CVC 9/12 RHC on 9/11 CRRT HD 9/17, 9/18, 09/20, 09/24 Permcath placed 09/23  Assessment and Plan: Acute on chronic diastolic congestive heart failure Pulmonary HTN, severe - group 2 PH (from elevated LVEDP  Presented with volume overload with increasing weight up to 255 pounds.  Dry weight around ~220 pounds Monitor input output and creatinine fluid management w/ dialysis and oral furosemide 80mg  daily on non-HD days  Overall poor prognosis given advanced renal disease - was told 9/19 that his prognosis is weeks  without HD, 6-12 months with HD Cardiology was consulted  Acute hypoxic respiratory failure d/t volume overload / CHF Supplemental O2 to wean as able   COPD exacerbation Patient does not have formal diagnosis but patient is having wheezing for past few days, initially he refused nebulizer treatment due to tachycardia in the past On 9/28 discussed again regarding nebulizer treatment, patient and his wife agreed Lianne Moris and budesonide nebulizer treatment twice daily 9/29 started Solu-Medrol 40 mg IV twice daily for 2 doses followed by prednisone 40 mg p.o. daily for 5 days. 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 Bradycardic currently Apixaban was held and patient was on heparin which was discontinued and apixaban 2.5 mg p.o. twice daily was resumed.   No role for PPM currently in light of need for HD and additional nidus for blood stream infection    Anemia of chronic inflammation, cardiorenal disease ESRD Continue Epogen 4000 units every TTS on hemodialysis days as per nephrology.      Hyperphosphatemia Binders per nephro   L ankle pain Fell in the ER Xray ordered and negative CAM walker for  use with ambulation   Hypertension Continue doxazosin, hydralazine   Hyperlipidemia Continue rosuvastatin, Lovaza   CAD with remote PCI Continue statin No ASA Imdur was discontinued by cardiologist   History of paroxysmal a fib Bradycardia currently so not on rate controlling  medications Resume eliquis    Bilateral lower extremity lymphedema, chronic Chronic skin changes, dermatitis and possible fungal infection 9/25 Started Lotrisone cream twice daily  GERD started pantoprazole 40 mg IV twice daily followed by 40 mg p.o. daily, started Tums as well   Goals of care Patient appears disinterested in aggressive care but his wife is insistent She has declined palliative care consultation, "he's not there yet" He has a very poor overall prognosis based on cardiorenal failure and is not a good long-term HD candidate Ongoing discussions are encouraged regarding code status as well as possibly EOL care    Morbid obesity Complicates overall prognosis Body mass index is 35.73 kg/m.  Nutrition Problem: Increased nutrient needs Etiology: acute illness (CRRT) Interventions: Nutrition Problem: Increased nutrient needs   Pressure Injury 12/03/22 Buttocks Right;Left Stage 1 -  Intact skin with non-blanchable redness of a localized area usually over a bony prominence. Purple/blue (Active)  12/03/22 0820  Location: Buttocks  Location Orientation: Right;Left  Staging: Stage 1 -  Intact skin with non-blanchable redness of a localized area usually over a bony prominence.  Wound Description (Comments): Purple/blue  Present on Admission:   Dressing Type Foam - Lift dressing to assess site every shift 12/20/22 0800     Diet: Renal diet DVT Prophylaxis: Therapeutic Anticoagulation with Eliquis    Advance goals of care discussion: Full code  Family Communication: family was present at bedside, at the time of interview.  The pt provided permission to discuss medical plan with the family. Opportunity was given to ask question and all questions were answered satisfactorily.   Disposition:  Pt is from home, admitted with renal failure, developed ESRD, started hemodialysis, still volume overload, which precludes a safe discharge. Discharge to SNF, when bed will be  available.    Subjective: No significant events overnight, patient still having significant shortness of breath, he was sleepy, did not offer any complaints.  Management plan discussed with patient's wife, agreed with the steroid treatment.   Physical Exam: General: NAD, lying comfortably Appear in no distress, affect appropriate Eyes: PERRLA ENT: Oral Mucosa Clear, moist  Neck: no JVD,  Cardiovascular: S1 and S2 Present, no Murmur, bradycardic Respiratory: Equal air entry bilaterally, mild bilateral crackles, significant bilateral wheezing  Abdomen: Bowel Sound present, Soft and no tenderness,  Skin: no rashes Extremities: Chronic lymphedema bilateral lower extremities, with dry and scaly skin. no calf tenderness Neurologic: without any new focal findings Gait not checked due to patient safety concerns  Vitals:   12/20/22 0111 12/20/22 0315 12/20/22 0838 12/20/22 1225  BP:  (!) 160/59 (!) 132/55 (!) 138/57  Pulse:  (!) 53 (!) 51 (!) 52  Resp: (!) 30 18 19 17   Temp:  97.8 F (36.6 C) 97.7 F (36.5 C) (!) 97.3 F (36.3 C)  TempSrc:  Oral  Oral  SpO2:  100% 99% 98%  Weight:      Height:        Intake/Output Summary (Last 24 hours) at 12/20/2022 1535 Last data filed at 12/20/2022 1234 Gross per 24 hour  Intake 240 ml  Output 250 ml  Net -10 ml   Filed Weights   12/15/22 1000 12/15/22 1849 12/18/22 0500  Weight: 40.3 kg 37.3 kg  106.6 kg    Data Reviewed: I have personally reviewed and interpreted daily labs, tele strips, imagings as discussed above. I reviewed all nursing notes, pharmacy notes, vitals, pertinent old records I have discussed plan of care as described above with RN and patient/family.  CBC: Recent Labs  Lab 12/14/22 0729 12/15/22 0729 12/17/22 0743 12/19/22 0723 12/19/22 0903  WBC 8.1 8.1 7.2 7.9 8.0  HGB 9.9* 9.5* 9.3* 10.1* 9.6*  HCT 31.0* 30.4* 29.4* 33.0* 31.4*  MCV 95.4 95.6 95.5 96.5 97.5  PLT 170 194 146* 141* 142*   Basic Metabolic  Panel: Recent Labs  Lab 12/17/22 0743 12/18/22 0525 12/19/22 0723 12/19/22 0903 12/20/22 0830  NA 136 136 136 137 135  K 4.6 4.0 3.8 3.4* 3.8  CL 98 97* 100 99 101  CO2 25 28 26 27 26   GLUCOSE 84 100* 135* 208* 139*  BUN 55* 38* 49* 51* 31*  CREATININE 4.09* 2.97* 3.67* 3.87* 2.80*  CALCIUM 8.4* 8.4* 8.3* 8.3* 8.0*  MG 1.9  --  1.8  --   --   PHOS 6.2* 4.4 5.5* 5.5* 3.6    Studies: No results found.  Scheduled Meds:  apixaban  2.5 mg Oral BID   arformoterol  15 mcg Nebulization BID   vitamin C  500 mg Oral BID   budesonide (PULMICORT) nebulizer solution  0.25 mg Nebulization BID   calcium carbonate  1 tablet Oral TID WC   Chlorhexidine Gluconate Cloth  6 each Topical Daily   cholecalciferol  2,000 Units Oral Daily   clotrimazole-betamethasone   Topical BID   docusate sodium  100 mg Oral BID   doxazosin  1 mg Oral BID   epoetin (EPOGEN/PROCRIT) injection  4,000 Units Intravenous Q T,Th,Sa-HD   feeding supplement (NEPRO CARB STEADY)  237 mL Oral TID BM   furosemide  80 mg Oral Q M,W,F   insulin aspart  0-15 Units Subcutaneous TID AC & HS   insulin aspart  3 Units Subcutaneous TID WC   ipratropium-albuterol  3 mL Nebulization Q6H   methylPREDNISolone (SOLU-MEDROL) injection  40 mg Intravenous BID   Followed by   Melene Muller ON 12/22/2022] predniSONE  40 mg Oral Q breakfast   multivitamin  1 tablet Oral QHS   neomycin-polymyxin b-dexamethasone  1 Application Right Eye TID   omega-3 acid ethyl esters  1 g Oral Daily   pantoprazole (PROTONIX) IV  40 mg Intravenous Q12H   Followed by   Melene Muller ON 12/23/2022] pantoprazole  40 mg Oral Daily   rosuvastatin  10 mg Oral Once per day on Monday Wednesday Friday   sodium chloride flush  3 mL Intravenous Q12H   Continuous Infusions:  sodium chloride     PRN Meds: sodium chloride, acetaminophen **OR** acetaminophen, calamine, calcium carbonate **FOLLOWED BY** [START ON 12/22/2022] calcium carbonate, diphenhydrAMINE-zinc acetate, fentaNYL  (SUBLIMAZE) injection, hydrocortisone cream, hydrOXYzine, ondansetron **OR** ondansetron (ZOFRAN) IV, oxyCODONE, polyethylene glycol, sodium chloride flush, traZODone  Time spent: 35 minutes  Author: Gillis Santa. MD [facility] 12/20/2022 3:35 PM  To reach On-call, see care teams to locate the attending and reach out to them via [email or web address]. If 7PM-7AM, please contact night-coverage If you still have difficulty reaching the attending provider, please page the [facility] (Director on Call) for [facility] on amion for assistance.

## 2022-12-20 NOTE — Plan of Care (Signed)
  Problem: Education: Goal: Ability to describe self-care measures that may prevent or decrease complications (Diabetes Survival Skills Education) will improve Outcome: Progressing Goal: Individualized Educational Video(s) Outcome: Progressing   Problem: Coping: Goal: Ability to adjust to condition or change in health will improve Outcome: Progressing   Problem: Fluid Volume: Goal: Ability to maintain a balanced intake and output will improve Outcome: Progressing   Problem: Health Behavior/Discharge Planning: Goal: Ability to identify and utilize available resources and services will improve Outcome: Progressing Goal: Ability to manage health-related needs will improve Outcome: Progressing   Problem: Tissue Perfusion: Goal: Adequacy of tissue perfusion will improve Outcome: Progressing   Problem: Skin Integrity: Goal: Risk for impaired skin integrity will decrease Outcome: Progressing   Problem: Education: Goal: Knowledge of General Education information will improve Description: Including pain rating scale, medication(s)/side effects and non-pharmacologic comfort measures Outcome: Progressing   Problem: Health Behavior/Discharge Planning: Goal: Ability to manage health-related needs will improve Outcome: Progressing   Problem: Activity: Goal: Risk for activity intolerance will decrease Outcome: Progressing   Problem: Clinical Measurements: Goal: Ability to maintain clinical measurements within normal limits will improve Outcome: Progressing Goal: Will remain free from infection Outcome: Progressing Goal: Diagnostic test results will improve Outcome: Progressing Goal: Respiratory complications will improve Outcome: Progressing Goal: Cardiovascular complication will be avoided Outcome: Progressing   Problem: Nutrition: Goal: Adequate nutrition will be maintained Outcome: Progressing

## 2022-12-20 NOTE — Progress Notes (Signed)
[facility]  ROUNDING NOTE   Subjective:   Patients volume status has improved significantly with dialysis.  Underwent dialysis treatment yesterday. Next dialysis treatment scheduled for Tuesday.    Objective:  Vital signs in last 24 hours:  Temp:  [97.3 F (36.3 C)-97.9 F (36.6 C)] 97.3 F (36.3 C) (09/29 1225) Pulse Rate:  [51-65] 52 (09/29 1225) Resp:  [17-30] 17 (09/29 1225) BP: (102-166)/(55-70) 138/57 (09/29 1225) SpO2:  [91 %-100 %] 98 % (09/29 1225)  Weight change:  Filed Weights   12/15/22 1000 12/15/22 1849 12/18/22 0500  Weight: 40.3 kg 37.3 kg 106.6 kg    Intake/Output: I/O last 3 completed shifts: In: -  Out: 503 [Urine:500; Other:3]   Intake/Output this shift:  Total I/O In: 240 [P.O.:240] Out: 50 [Urine:50]  Physical Exam: General: NAD  Head: Normocephalic, atraumatic. Moist oral mucosal membranes  Eyes: Anicteric, ectropion  Lungs:  Diminished, room air  Heart: S1S2 no rubs  Abdomen:  Soft, nontender  Extremities: 2+ peripheral edema.  Neurologic: Awake, alert  Skin: No lesions  Access: Rt Chest permcath    Basic Metabolic Panel: Recent Labs  Lab 12/17/22 0743 12/18/22 0525 12/19/22 0723 12/19/22 0903 12/20/22 0830  NA 136 136 136 137 135  K 4.6 4.0 3.8 3.4* 3.8  CL 98 97* 100 99 101  CO2 25 28 26 27 26   GLUCOSE 84 100* 135* 208* 139*  BUN 55* 38* 49* 51* 31*  CREATININE 4.09* 2.97* 3.67* 3.87* 2.80*  CALCIUM 8.4* 8.4* 8.3* 8.3* 8.0*  MG 1.9  --  1.8  --   --   PHOS 6.2* 4.4 5.5* 5.5* 3.6    Liver Function Tests: Recent Labs  Lab 12/17/22 0605 12/18/22 0525 12/19/22 0723 12/19/22 0903 12/20/22 0830  ALBUMIN 3.2* 3.1* 3.2* 3.2* 3.0*   No results for input(s): "LIPASE", "AMYLASE" in the last 168 hours. No results for input(s): "AMMONIA" in the last 168 hours.  CBC: Recent Labs  Lab 12/14/22 0729 12/15/22 0729 12/17/22 0743 12/19/22 0723 12/19/22 0903  WBC 8.1 8.1 7.2 7.9 8.0  HGB 9.9* 9.5* 9.3*  10.1* 9.6*  HCT 31.0* 30.4* 29.4* 33.0* 31.4*  MCV 95.4 95.6 95.5 96.5 97.5  PLT 170 194 146* 141* 142*    Cardiac Enzymes: No results for input(s): "CKTOTAL", "CKMB", "CKMBINDEX", "TROPONINI" in the last 168 hours.  BNP: Invalid input(s): "POCBNP"  CBG: Recent Labs  Lab 12/19/22 1335 12/19/22 1544 12/19/22 2120 12/19/22 2339 12/20/22 1216  GLUCAP 142* 199* 305* 224* 136*    Microbiology: Results for orders placed or performed during the hospital encounter of 11/26/22  MRSA Next Gen by PCR, Nasal     Status: None   Collection Time: 11/30/22  4:10 PM   Specimen: Nasal Mucosa; Nasal Swab  Result Value Ref Range Status   MRSA by PCR Next Gen NOT DETECTED NOT DETECTED Final    Comment: (NOTE) The GeneXpert MRSA Assay (FDA approved for NASAL specimens only), is one component of a comprehensive MRSA colonization surveillance program. It is not intended to diagnose MRSA infection nor to guide or monitor treatment for MRSA infections. Test performance is not FDA approved in patients less than 57 years old. Performed at [facility], [address] [place], [place] [address]     Coagulation Studies: No results for input(s): "LABPROT", "INR" in the last 72 hours.   Urinalysis: No results for input(s): "COLORURINE", "LABSPEC", "PHURINE", "GLUCOSEU", "HGBUR", "BILIRUBINUR", "KETONESUR", "PROTEINUR", "UROBILINOGEN", "NITRITE", "LEUKOCYTESUR" in the last 72 hours.  Invalid input(s): "APPERANCEUR"    Imaging: No results found.   Medications:    sodium chloride      apixaban  2.5 mg Oral BID   arformoterol  15 mcg Nebulization BID   vitamin C  500 mg Oral BID   budesonide (PULMICORT) nebulizer solution  0.25 mg Nebulization BID   calcium carbonate  1 tablet Oral TID WC   Chlorhexidine Gluconate Cloth  6 each Topical Daily   cholecalciferol  2,000 Units Oral Daily   clotrimazole-betamethasone   Topical BID   docusate sodium  100 mg Oral BID   doxazosin  1  mg Oral BID   epoetin (EPOGEN/PROCRIT) injection  4,000 Units Intravenous Q T,Th,Sa-HD   feeding supplement (NEPRO CARB STEADY)  237 mL Oral TID BM   furosemide  80 mg Oral Q M,W,F   insulin aspart  0-15 Units Subcutaneous TID AC & HS   insulin aspart  3 Units Subcutaneous TID WC   ipratropium-albuterol  3 mL Nebulization Q6H   methylPREDNISolone (SOLU-MEDROL) injection  40 mg Intravenous BID   Followed by   Melene Muller ON 12/22/2022] predniSONE  40 mg Oral Q breakfast   multivitamin  1 tablet Oral QHS   neomycin-polymyxin b-dexamethasone  1 Application Right Eye TID   omega-3 acid ethyl esters  1 g Oral Daily   pantoprazole (PROTONIX) IV  40 mg Intravenous Q12H   Followed by   Melene Muller ON 12/23/2022] pantoprazole  40 mg Oral Daily   rosuvastatin  10 mg Oral Once per day on Monday Wednesday Friday   sodium chloride flush  3 mL Intravenous Q12H   sodium chloride, acetaminophen **OR** acetaminophen, calamine, calcium carbonate **FOLLOWED BY** [START ON 12/22/2022] calcium carbonate, diphenhydrAMINE-zinc acetate, fentaNYL (SUBLIMAZE) injection, hydrocortisone cream, hydrOXYzine, ondansetron **OR** ondansetron (ZOFRAN) IV, oxyCODONE, polyethylene glycol, sodium chloride flush, traZODone  Assessment/ Plan:  [name] is a 82 y.o.  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 Due to progression of kidney disease and outpatient diuretic therapy failure, we feel this patient has progressed to end stage renal disease.  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 CRRT from 9/12 to 9/13 Patient underwent hemodialysis treatment yesterday.   Tolerated well.  Volume status overall improved significantly as compared to admission.  Next steps are placement in rehabilitation center followed by securing dialysis center.  Care management on the case.  Acute exacerbation of chronic diastolic congestive heart failure: complicated with acute respiratory failure and pulmonary hypertension. -Volume status improved significantly with ongoing dialysis treatments with ultrafiltration.  Diabetes mellitus type II with chronic kidney disease - holding empagliflozin  4.  Anemia of chronic kidney disease.  Continue Epogen 4000's IV with dialysis treatments.  5.  Secondary hyperparathyroidism.  Continue to periodically monitor bone metabolism parameters.   LOS: 24 Kyle Roberson 9/29/20242:53 PM

## 2022-12-21 DIAGNOSIS — I5033 Acute on chronic diastolic (congestive) heart failure: Secondary | ICD-10-CM | POA: Diagnosis not present

## 2022-12-21 LAB — RENAL FUNCTION PANEL
Albumin: 3.2 g/dL — ABNORMAL LOW (ref 3.5–5.0)
Anion gap: 13 (ref 5–15)
BUN: 52 mg/dL — ABNORMAL HIGH (ref 8–23)
CO2: 24 mmol/L (ref 22–32)
Calcium: 8.6 mg/dL — ABNORMAL LOW (ref 8.9–10.3)
Chloride: 98 mmol/L (ref 98–111)
Creatinine, Ser: 3.99 mg/dL — ABNORMAL HIGH (ref 0.61–1.24)
GFR, Estimated: 14 mL/min — ABNORMAL LOW (ref >60)
Glucose, Bld: 293 mg/dL — ABNORMAL HIGH (ref 70–99)
Phosphorus: 4.4 mg/dL (ref 2.5–4.6)
Potassium: 4.4 mmol/L (ref 3.5–5.1)
Sodium: 135 mmol/L (ref 135–145)

## 2022-12-21 LAB — GLUCOSE, CAPILLARY
Glucose-Capillary: 280 mg/dL — ABNORMAL HIGH (ref 70–99)
Glucose-Capillary: 338 mg/dL — ABNORMAL HIGH (ref 70–99)
Glucose-Capillary: 351 mg/dL — ABNORMAL HIGH (ref 70–99)
Glucose-Capillary: 320 mg/dL — ABNORMAL HIGH (ref 70–99)

## 2022-12-21 LAB — TROPONIN I (HIGH SENSITIVITY): Troponin I (High Sensitivity): 29 ng/L — ABNORMAL HIGH (ref <18)

## 2022-12-21 MED ORDER — IPRATROPIUM-ALBUTEROL 0.5-2.5 (3) MG/3ML IN SOLN
3.0000 mL | Freq: Two times a day (BID) | RESPIRATORY_TRACT | Status: DC
  Administered 2022-12-21 – 2022-12-23 (×5): 3 mL via RESPIRATORY_TRACT
  Filled 2022-12-21 (×6): qty 3

## 2022-12-21 MED ORDER — ISOSORBIDE MONONITRATE ER 30 MG PO TB24: 30.0000 mg | ORAL_TABLET | Freq: Every day | ORAL | Status: DC

## 2022-12-21 MED ORDER — INSULIN GLARGINE-YFGN 100 UNIT/ML ~~LOC~~ SOLN
20.0000 [IU] | Freq: Every day | SUBCUTANEOUS | Status: DC
  Administered 2022-12-21 – 2023-01-04 (×15): 20 [IU] via SUBCUTANEOUS
  Filled 2022-12-21 (×15): qty 0.2

## 2022-12-21 MED ORDER — ISOSORBIDE MONONITRATE ER 30 MG PO TB24
60.0000 mg | ORAL_TABLET | Freq: Every day | ORAL | Status: DC
  Administered 2022-12-21 – 2023-01-04 (×15): 60 mg via ORAL
  Filled 2022-12-21: qty 2
  Filled 2022-12-21: qty 1
  Filled 2022-12-21: qty 2
  Filled 2022-12-21: qty 1
  Filled 2022-12-21: qty 2
  Filled 2022-12-21 (×3): qty 1
  Filled 2022-12-21 (×2): qty 2
  Filled 2022-12-21: qty 1
  Filled 2022-12-21: qty 2
  Filled 2022-12-21: qty 1
  Filled 2022-12-21: qty 2
  Filled 2022-12-21 (×2): qty 1

## 2022-12-21 NOTE — Progress Notes (Signed)
Inpatient Rehab Admissions Coordinator:  ? ?Per therapy recommendations,  patient was screened for CIR candidacy by Devaney Segers, MS, CCC-SLP. At this time, Pt. Appears to be a a potential candidate for CIR. I will place   order for rehab consult per protocol for full assessment. Please contact me any with questions. ? ?Trine Fread, MS, CCC-SLP ?Rehab Admissions Coordinator  ?[phone] (celll) ?[phone] (office) ? ?

## 2022-12-21 NOTE — Progress Notes (Signed)
[facility] Progress Note  Patient: Kyle Roberson    WUJ:[id number]  DOA: 11/26/2022     Date of Service: the patient was seen and examined on 12/21/2022  No chief complaint on file.  Brief hospital course: 81yo with h/o CAD, stage 5 CKD, chronic diastolic CHF, BLE lymphedema, afib on Eliquis, PAH, and SDH presented on 09/05 with weight gain and SOB.     Hospital course / major events:  He was diagnosed with acute on chronic CHF and started on Lasix drip.  Cardiology was consulted. Echo this admission with EF 55-60%, mild LVH, moderate RV enlargement with mildly decreased RV systolic function, PASP 73 mmHg, severe LAE, mild-moderate TR, IVC dilated.  IV midodrine drip 09/09.   RHC done on 09/11 with elevated R > L filling pressures, mostly right-sided, worsening renal function and decreased UOP so Lasix stopped.  Started on CRRT 09/12 for additional volume removal and completed this. 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  He is on IV Lasix.  Remains on heparin gtt for Afib, has been bradycardic w/ ventricular rates 40-50s but go into 60-70 range on standing. Resumed Eliquis 09/24 Permcath placement 12/14/22 and now pending outpatient placement and HD plan    Consultants:  Cardiology - advanced HF team  Nephrology  PCCM Palliative care Surgery PT/OT [facility] team   Procedures: Echocardiogram 9/6 CVC 9/12 RHC on 9/11 CRRT HD 9/17, 9/18, 09/20, 09/24 Permcath placed 09/23  Assessment and Plan: Acute on chronic diastolic congestive heart failure Pulmonary HTN, severe - group 2 PH (from elevated LVEDP  Presented with volume overload with increasing weight up to 255 pounds.  Dry weight around ~220 pounds Monitor input output and creatinine fluid management w/ dialysis and oral furosemide 80mg  daily on non-HD days  Overall poor prognosis given advanced renal disease - was told 9/19 that his prognosis is weeks  without HD, 6-12 months with HD Cardiology was consulted  Acute hypoxic respiratory failure d/t volume overload / CHF/COPD Supplemental O2 to wean as able   COPD exacerbation Patient does not have formal diagnosis but patient is having wheezing for past few days, initially he refused nebulizer treatment due to tachycardia in the past On 9/28 discussed again regarding nebulizer treatment, patient and his wife agreed Lianne Moris and budesonide nebulizer treatment twice daily 9/29 started Solu-Medrol 40 mg IV twice daily for 2 doses followed by prednisone 40 mg p.o. daily for 5 days. 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 daily, patient had an episode of chest pain at night on 9/29 which resolved after 2 doses of nitroglycerin. Cardiology following.   Atrial fibrillation: Permanent. Bradycardic currently Apixaban was held and patient was on heparin which was discontinued and apixaban 2.5 mg p.o. twice daily was resumed.   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     Hyperphosphatemia Binders per nephro   L ankle pain Fell in the ER Xray ordered and negative CAM walker for use with ambulation   Hypertension Continue doxazosin, hydralazine    Hyperlipidemia Continue rosuvastatin, Lovaza     Bilateral lower extremity lymphedema, chronic Chronic skin changes, dermatitis and possible fungal infection 9/25 Started Lotrisone cream twice daily  GERD started pantoprazole 40 mg IV twice daily followed by 40 mg p.o. daily, started Tums as well  Goals of care Patient appears disinterested in aggressive care but his wife is insistent She has declined palliative care consultation, "he's not there yet" He has a very poor overall prognosis based on cardiorenal failure and is not a good long-term HD candidate Ongoing discussions are encouraged regarding code status as well as possibly EOL care    Morbid obesity Complicates overall prognosis Body mass index is 35.73 kg/m.  Nutrition Problem: Increased nutrient needs Etiology: acute illness (CRRT) Interventions: Nutrition Problem: Increased nutrient needs   Pressure Injury 12/03/22 Buttocks Right;Left Stage 1 -  Intact skin with non-blanchable redness of a localized area usually over a bony prominence. Purple/blue (Active)  12/03/22 0820  Location: Buttocks  Location Orientation: Right;Left  Staging: Stage 1 -  Intact skin with non-blanchable redness of a localized area usually over a bony prominence.  Wound Description (Comments): Purple/blue  Present on Admission:   Dressing Type Foam - Lift dressing to assess site every shift 12/20/22 2027     Diet: Renal diet DVT Prophylaxis: Therapeutic Anticoagulation with Eliquis    Advance goals of care discussion: Full code  Family Communication: family was present at bedside, at the time of interview.  The pt provided permission to discuss medical plan with the family. Opportunity was given to ask question and all questions were answered satisfactorily.   Disposition:  Pt is from home, admitted with renal failure, developed ESRD, started hemodialysis, still volume overload, which precludes a safe discharge. Discharge to SNF, when  bed will be available.    Subjective: Last night patient had chest pain which lasted about 30 minutes, resolved after 2 doses of nitroglycerin.  Patient slept well afterwards.  In the morning time patient was chest pain-free, has mild shortness of breath but overall feels improvement, denied any other complaints.   Physical Exam: General: NAD, lying comfortably Appear in no distress, affect appropriate Eyes: PERRLA ENT: Oral Mucosa Clear, moist  Neck: no JVD,  Cardiovascular: S1 and S2 Present, no Murmur, bradycardic Respiratory: Equal air entry bilaterally, mild bilateral crackles, significant bilateral wheezing  Abdomen: Bowel Sound present, Soft and no tenderness,  Skin: no rashes Extremities: Chronic lymphedema bilateral lower extremities, with dry and scaly skin. mild tenderness Neurologic: without any new focal findings Gait not checked due to patient safety concerns  Vitals:   12/21/22 0532 12/21/22 0731 12/21/22 0734 12/21/22 1301  BP: 134/66 (!) 129/57  (!) 140/64  Pulse: 60 (!) 49 (!) 45 70  Resp: 16 17 16 17   Temp: 97.7 F (36.5 C) (!) 97.3 F (36.3 C)  97.8 F (36.6 C)  TempSrc: Oral     SpO2: 100% 100% 100% 100%  Weight:      Height:        Intake/Output Summary (Last 24 hours) at 12/21/2022 1515 Last data filed at 12/21/2022 0558 Gross per 24 hour  Intake --  Output 250 ml  Net -250 ml   Ceasar Mons  Weights   12/15/22 1000 12/15/22 1849 12/18/22 0500  Weight: 40.3 kg 37.3 kg 106.6 kg    Data Reviewed: I have personally reviewed and interpreted daily labs, tele strips, imagings as discussed above. I reviewed all nursing notes, pharmacy notes, vitals, pertinent old records I have discussed plan of care as described above with RN and patient/family.  CBC: Recent Labs  Lab 12/15/22 0729 12/17/22 0743 12/19/22 0723 12/19/22 0903 12/20/22 2144  WBC 8.1 7.2 7.9 8.0 6.4  HGB 9.5* 9.3* 10.1* 9.6* 10.1*  HCT 30.4* 29.4* 33.0* 31.4* 32.0*  MCV 95.6 95.5 96.5  97.5 95.8  PLT 194 146* 141* 142* 163   Basic Metabolic Panel: Recent Labs  Lab 12/17/22 0743 12/18/22 0525 12/19/22 0723 12/19/22 0903 12/20/22 0830 12/20/22 2144 12/21/22 0525  NA 136 136 136 137 135 136 135  K 4.6 4.0 3.8 3.4* 3.8 4.3 4.4  CL 98 97* 100 99 101 99 98  CO2 25 28 26 27 26 25 24   GLUCOSE 84 100* 135* 208* 139* 299* 293*  BUN 55* 38* 49* 51* 31* 43* 52*  CREATININE 4.09* 2.97* 3.67* 3.87* 2.80* 3.69* 3.99*  CALCIUM 8.4* 8.4* 8.3* 8.3* 8.0* 8.6* 8.6*  MG 1.9  --  1.8  --   --  1.7  --   PHOS 6.2* 4.4 5.5* 5.5* 3.6  --  4.4    Studies: DG Chest Port 1 View  Result Date: 12/20/2022 CLINICAL DATA:  Chest pain EXAM: PORTABLE CHEST 1 VIEW COMPARISON:  12/18/2022 FINDINGS: Shallow inspiration. Cardiac enlargement with mild pulmonary vascular congestion. Perihilar infiltration suggesting edema. No focal consolidation. Haziness in the costophrenic angles may indicate small pleural effusions. No pneumothorax. Right central venous catheter with tip over the SVC region. Calcification of the aorta. IMPRESSION: Cardiac enlargement with pulmonary vascular congestion and perihilar edema. Probable small pleural effusions. Electronically Signed   By: Burman Nieves M.D.   On: 12/20/2022 22:44    Scheduled Meds:  apixaban  2.5 mg Oral BID   arformoterol  15 mcg Nebulization BID   vitamin C  500 mg Oral BID   budesonide (PULMICORT) nebulizer solution  0.25 mg Nebulization BID   calcium carbonate  1 tablet Oral TID WC   Chlorhexidine Gluconate Cloth  6 each Topical Daily   cholecalciferol  2,000 Units Oral Daily   clotrimazole-betamethasone   Topical BID   docusate sodium  100 mg Oral BID   doxazosin  1 mg Oral BID   epoetin (EPOGEN/PROCRIT) injection  4,000 Units Intravenous Q T,Th,Sa-HD   furosemide  80 mg Oral Q M,W,F   insulin aspart  0-15 Units Subcutaneous TID AC & HS   insulin aspart  3 Units Subcutaneous TID WC   ipratropium-albuterol  3 mL Nebulization BID    isosorbide mononitrate  60 mg Oral Daily   methylPREDNISolone (SOLU-MEDROL) injection  40 mg Intravenous BID   Followed by   Melene Muller ON 12/22/2022] predniSONE  40 mg Oral Q breakfast   multivitamin  1 tablet Oral QHS   neomycin-polymyxin b-dexamethasone  1 Application Right Eye TID   omega-3 acid ethyl esters  1 g Oral Daily   pantoprazole (PROTONIX) IV  40 mg Intravenous Q12H   Followed by   Melene Muller ON 12/23/2022] pantoprazole  40 mg Oral Daily   rosuvastatin  10 mg Oral Once per day on Monday Wednesday Friday   sodium chloride flush  3 mL Intravenous Q12H   Continuous Infusions:  sodium chloride     PRN  Meds: sodium chloride, acetaminophen **OR** acetaminophen, calamine, calcium carbonate **FOLLOWED BY** [START ON 12/22/2022] calcium carbonate, diphenhydrAMINE-zinc acetate, fentaNYL (SUBLIMAZE) injection, hydrocortisone cream, hydrOXYzine, morphine injection, nitroGLYCERIN, ondansetron **OR** ondansetron (ZOFRAN) IV, oxyCODONE, polyethylene glycol, sodium chloride flush, traZODone  Time spent: 35 minutes  Author: Gillis Santa. MD [facility] 12/21/2022 3:15 PM  To reach On-call, see care teams to locate the attending and reach out to them via [email or web address]. If 7PM-7AM, please contact night-coverage If you still have difficulty reaching the attending provider, please page the [facility] (Director on Call) for [facility] on amion for assistance.

## 2022-12-21 NOTE — Progress Notes (Signed)
Physical Therapy Treatment Patient Details Name: Kyle Roberson MRN: [id number] DOB: 09/20/1940 Today's Date: 12/21/2022   History of Present Illness [name] is an 81yoM who comes to [facility] from OP nephrology on 9/5 due to LEE and ABD swelling. PMH: CKD, CHF. As of 9/9 he was transferred to ICU for IV milrinone gtt per [facility] heart failure MD. Started CRRT this admission and transitioned to HD.    PT Comments  Pt is received in bed with spouse at bedside, he is agreeable to PT session following encouragement. Pt performs bed mobility mod A x2 and transfers min A x2. Pt requires assist with BLE management and trunk control for sitting EOB due to decreased generalized strength. During x2 STS Pt demonstrates mod posterior lean and reliance on bed to prevent falling backward. Encouraged Pt to lean forward with verbal and tactile cues to correct lean. Pt able to tolerate standing balance activity but requires frequent multimodal cuing for maintaining upright posture. At end of standing activities, Pt endorses 10/10 modified RPE. Educated Pt and spouse extensively regarding discharge disposition and requirements of participation throughout therapy visits-both verbalized understanding. Communicated with care team regarding change of discharge disposition via secure chat. Pt would benefit from cont skilled PT to address above deficits and promote optimal return to PLOF.   If plan is discharge home, recommend the following: Assist for transportation;Help with stairs or ramp for entrance;Assistance with cooking/housework;Two people to help with walking and/or transfers;Two people to help with bathing/dressing/bathroom   Can travel by private vehicle     No  Equipment Recommendations  None recommended by PT    Recommendations for Other Services       Precautions / Restrictions Precautions Precautions: Fall Restrictions Weight Bearing Restrictions: No     Mobility  Bed Mobility Overal bed  mobility: Needs Assistance Bed Mobility: Supine to Sit Rolling: +2 for physical assistance   Supine to sit: +2 for physical assistance, Mod assist Sit to supine: +2 for physical assistance, Mod assist   General bed mobility comments: able to initiate bed mobility but required extensive cuing and increased time; BLE management and trunk control    Transfers Overall transfer level: Needs assistance Equipment used: Rolling walker (2 wheels) Transfers: Sit to/from Stand Sit to Stand: Min assist, +2 physical assistance           General transfer comment: x2 STS with min A x2 for AD management and slight posterior lean    Ambulation/Gait               General Gait Details: Deferred at this time   Stairs             Wheelchair Mobility     Tilt Bed    Modified Rankin (Stroke Patients Only)       Balance Overall balance assessment: Needs assistance Sitting-balance support: Feet supported, Bilateral upper extremity supported Sitting balance-Leahy Scale: Fair Sitting balance - Comments: able to maintain seated EOB balance during functional activities but required CGA for increased stability Postural control: Posterior lean Standing balance support: Bilateral upper extremity supported, During functional activity, Reliant on assistive device for balance Standing balance-Leahy Scale: Fair Standing balance comment: frequent cuing for upright posture; heavy reliance on AD and mod posterior lean requiring tactile cues for weight shifting forward                            Cognition Arousal: Alert Behavior During Therapy: [facility] for  tasks assessed/performed Overall Cognitive Status: Within Functional Limits for tasks assessed                                 General Comments: AO x4; pleasant and hesistant for PT but with encouragement agreed to participate        Exercises Other Exercises Other Exercises: 2x60 sec standing tolerance using  RW, close guard for safety    General Comments        Pertinent Vitals/Pain Pain Assessment Pain Assessment: 0-10 Pain Score: 10-Worst pain ever Pain Location: generalized Pain Descriptors / Indicators: Discomfort, Grimacing Pain Intervention(s): Monitored during session, Repositioned    Home Living                          Prior Function            PT Goals (current goals can now be found in the care plan section) Acute Rehab PT Goals Patient Stated Goal: to get better PT Goal Formulation: With patient/family Time For Goal Achievement: 12/30/22 Potential to Achieve Goals: Fair Progress towards PT goals: Progressing toward goals    Frequency    7X/week      PT Plan      Co-evaluation              AM-PAC PT "6 Clicks" Mobility   Outcome Measure  Help needed turning from your back to your side while in a flat bed without using bedrails?: A Lot Help needed moving from lying on your back to sitting on the side of a flat bed without using bedrails?: A Lot Help needed moving to and from a bed to a chair (including a wheelchair)?: A Lot Help needed standing up from a chair using your arms (e.g., wheelchair or bedside chair)?: A Lot Help needed to walk in hospital room?: Total Help needed climbing 3-5 steps with a railing? : Total 6 Click Score: 10    End of Session   Activity Tolerance: Patient tolerated treatment well Patient left: in bed;with call bell/phone within reach;with family/visitor present Nurse Communication: Mobility status PT Visit Diagnosis: Unsteadiness on feet (R26.81);Muscle weakness (generalized) (M62.81)     Time: 1610-9604 PT Time Calculation (min) (ACUTE ONLY): 38 min  Charges:                            Elmon Else, SPT    [facility] 12/21/2022, 12:14 PM

## 2022-12-21 NOTE — Progress Notes (Signed)
Occupational Therapy Treatment Patient Details Name: Kyle Roberson MRN: [id number] DOB: Oct 14, 1940 Today's Date: 12/21/2022   History of present illness Kyle Roberson is an 81yoM who comes to [facility] from OP nephrology on 9/5 due to LEE and ABD swelling. PMH: CKD, CHF. As of 9/9 he was transferred to ICU for IV milrinone gtt per [facility] heart failure MD. Started CRRT this admission and transitioned to HD.   OT comments  Pt seen for OT tx. Pt sleeping, wakes to VC. Endorses fatigue but with encouragement agreeable to OT focused on BUE strengthening to improve pt's bed mobility and ADL participation. Pt instructed in resistive BUE elbow ex/flex x10 each. Attempted dynamic cross body shoulder horizontal add/abduction to facilitate repositioning and rolling in bed but pt endorsed R>L shoulder pain. Pain worse with AROM versus AAROM and PROM. Improved with rest. Pt educated in requirements for consideration of IR for therapy. Pt verbalized understanding and expresses motivation to improve.       If plan is discharge home, recommend the following:  A lot of help with bathing/dressing/bathroom;Assistance with cooking/housework;Assist for transportation;Help with stairs or ramp for entrance;Two people to help with walking and/or transfers   Equipment Recommendations  BSC/3in1    Recommendations for Other Services      Precautions / Restrictions Precautions Precautions: Fall Restrictions Weight Bearing Restrictions: No       Mobility Bed Mobility               General bed mobility comments: pt declined 2/2 fatigue    Transfers                         Balance                                           ADL either performed or assessed with clinical judgement   ADL                                              Extremity/Trunk Assessment              Vision       Perception     Praxis      Cognition Arousal:  Alert Behavior During Therapy: WFL for tasks assessed/performed Overall Cognitive Status: Within Functional Limits for tasks assessed                                          Exercises Other Exercises Other Exercises: Pt instructed in resistive BUE elbow ex/flex x10 each. Attempted dynamic cross body shoulder horizontal add/abduction to facilitate repositioning and rolling in bed but pt endorsed R>L shoulder pain.    Shoulder Instructions       General Comments      Pertinent Vitals/ Pain       Pain Assessment Pain Assessment: Faces Faces Pain Scale: Hurts little more Pain Location: R>LUE shoulder with AROM Pain Descriptors / Indicators: Discomfort, Grimacing Pain Intervention(s): Monitored during session, Limited activity within patient's tolerance, Repositioned  Home Living  Prior Functioning/Environment              Frequency  Min 1X/week        Progress Toward Goals  OT Goals(current goals can now be found in the care plan section)  Progress towards OT goals: Progressing toward goals  Acute Rehab OT Goals Patient Stated Goal: go home OT Goal Formulation: With patient/family Time For Goal Achievement: 12/30/22 Potential to Achieve Goals: Good  Plan      Co-evaluation                 AM-PAC OT "6 Clicks" Daily Activity     Outcome Measure   Help from another person eating meals?: None Help from another person taking care of personal grooming?: A Little Help from another person toileting, which includes using toliet, bedpan, or urinal?: A Lot Help from another person bathing (including washing, rinsing, drying)?: A Lot Help from another person to put on and taking off regular upper body clothing?: A Little Help from another person to put on and taking off regular lower body clothing?: A Lot 6 Click Score: 16    End of Session Equipment Utilized During Treatment:  Oxygen  OT Visit Diagnosis: Other abnormalities of gait and mobility (R26.89);Muscle weakness (generalized) (M62.81);Pain Pain - Right/Left: Right Pain - part of body: Shoulder   Activity Tolerance Patient tolerated treatment well;Patient limited by pain   Patient Left in bed;with call bell/phone within reach;with bed alarm set   Nurse Communication          Time: 1610-9604 OT Time Calculation (min): 13 min  Charges: OT General Charges $OT Visit: 1 Visit OT Treatments $Therapeutic Exercise: 8-22 mins  Arman Filter., MPH, MS, OTR/L ascom [phone] 12/21/22, 2:11 PM

## 2022-12-21 NOTE — Progress Notes (Signed)
Inpatient Rehab Admissions Coordinator:   Consult received.  Note pt/family prefer [facility].  We will follow from a distance.  If [facility] unable to accept I will speak to the patient/family.   Estill Dooms, PT, DPT Admissions Coordinator [phone] 12/21/22  3:44 PM

## 2022-12-21 NOTE — Inpatient Diabetes Management (Signed)
Inpatient Diabetes Program Recommendations  AACE/ADA: New Consensus Statement on Inpatient Glycemic Control (2015)  Target Ranges:  Prepandial:   less than 140 mg/dL      Peak postprandial:   less than 180 mg/dL (1-2 hours)      Critically ill patients:  140 - 180 mg/dL    Latest Reference Range & Units 12/20/22 12:16 12/20/22 16:56 12/20/22 21:34  Glucose-Capillary 70 - 99 mg/dL  5 units Novolog @0959 --No CBG downloaded 136 (H)  2 units Novolog  192 (H)  6 units Novolog  273 (H)  8 units Novolog   (H): Data is abnormally high  Latest Reference Range & Units 12/21/22 07:30  Glucose-Capillary 70 - 99 mg/dL 161 (H)  11 units Novolog   (H): Data is abnormally high   Home DM Meds: Glipizide 10 mg daily  Current Orders: Novolog Moderate Correction Scale/ SSI (0-15 units) TID AC + HS      Novolog 3 units TID with meals     MD- Note Solumedrol 40 mg BID started yest AM--last dose due tonight Will start Prednisone AM 10/01  May consider starting Semglee 8 units Daily (0.075 units/kg)   --Will follow patient during hospitalization--  Ambrose Finland RN, MSN, CDCES Diabetes Coordinator Inpatient Glycemic Control Team Team Pager: [phone] (8a-5p)

## 2022-12-21 NOTE — Progress Notes (Signed)
Pt refusing nebulizer, wife insisting he get nebulizer. Nebulizer given as blow by

## 2022-12-21 NOTE — TOC Progression Note (Signed)
Transition of Care [facility]) - Progression Note    Patient Details  Name: Kyle Roberson MRN: [id number] Date of Birth: [birth year]  Transition of Care [facility]) CM/SW Contact  Darolyn Rua, [place] Phone Number: 12/21/2022, 11:00 AM  Clinical Narrative:     Met with patient and wife [name] at bedside to review bed offers.   Provided list of current bed offers.   They report goal is for patient to go to [facility] inpatient rehab, they are aware this requires patient to withstand at least 3 hours of intensive therapy services and he may not qualify. PT at bedside to re assess today.   [name] reports she will review snf list and requests csw follow up with [facility] to inquire if able to offer a bed as they have in house HD. CSW has reached out to [place] with [facility].   [name] reports [facility] [place].   Expected Discharge Plan: Home/Self Care Barriers to Discharge: No Barriers Identified  Expected Discharge Plan and Services       Living arrangements for the past 2 months: Single Family Home                                       Social Determinants of Health (SDOH) Interventions SDOH Screenings   Food Insecurity: No Food Insecurity (12/01/2022)  Housing: Low Risk  (12/01/2022)  Transportation Needs: No Transportation Needs (12/01/2022)  Utilities: Not At Risk (12/01/2022)  Depression (PHQ2-9): Low Risk  (07/27/2022)  Tobacco Use: Medium Risk (11/26/2022)    Readmission Risk Interventions     No data to display

## 2022-12-21 NOTE — Progress Notes (Addendum)
Patient ID: Kyle Roberson, male   DOB: 1940-04-22, 82 y.o.   MRN: [id number]     Advanced Heart Failure Rounding Note  PCP-Cardiologist: Julien Nordmann, MD   Subjective:    Now ESRD. Tolerating iHD. Perm cath placed 12/14/22  Episode of chest pain last night. EKG with junctional rhythm. HsTrop 24>24. CP resolved with SLG NTG.   This morning, resting comfortably in bed. Wife at bedside.    Objective:   Weight Range: 106.6 kg Body mass index is 35.73 kg/m.   Vital Signs:   Temp:  [97.3 F (36.3 C)-98.3 F (36.8 C)] 97.3 F (36.3 C) (09/30 0731) Pulse Rate:  [45-92] 45 (09/30 0734) Resp:  [16-20] 16 (09/30 0734) BP: (128-138)/(55-66) 129/57 (09/30 0731) SpO2:  [95 %-100 %] 100 % (09/30 0734) Last BM Date : 12/18/22  Weight change: Filed Weights   12/15/22 1000 12/15/22 1849 12/18/22 0500  Weight: 40.3 kg 37.3 kg 106.6 kg   Intake/Output:   Intake/Output Summary (Last 24 hours) at 12/21/2022 0804 Last data filed at 12/21/2022 0558 Gross per 24 hour  Intake 120 ml  Output 250 ml  Net -130 ml    Physical Exam  General:  elderly, weak appearing.  No respiratory difficulty HEENT: normal Neck: supple. JVD ~7 cm. Carotids 2+ bilat; no bruits. No lymphadenopathy or thyromegaly appreciated. RIJ HD cath Cor: PMI nondisplaced. Regular rate & rhythm. No rubs, gallops or murmurs. Lungs: diminished Abdomen: soft, nontender, nondistended. No hepatosplenomegaly. No bruits or masses. Good bowel sounds. Extremities: no cyanosis, clubbing, rash, trace BLE edema  Neuro: drowsy from pain meds  Telemetry   Atrial fibrillation 40s (Personally reviewed)    Labs    Labs and imaging reviewed in Epic.  Medications:   Scheduled Medications:  apixaban  2.5 mg Oral BID   arformoterol  15 mcg Nebulization BID   vitamin C  500 mg Oral BID   budesonide (PULMICORT) nebulizer solution  0.25 mg Nebulization BID   calcium carbonate  1 tablet Oral TID WC   Chlorhexidine Gluconate Cloth   6 each Topical Daily   cholecalciferol  2,000 Units Oral Daily   clotrimazole-betamethasone   Topical BID   docusate sodium  100 mg Oral BID   doxazosin  1 mg Oral BID   epoetin (EPOGEN/PROCRIT) injection  4,000 Units Intravenous Q T,Th,Sa-HD   feeding supplement (NEPRO CARB STEADY)  237 mL Oral TID BM   furosemide  80 mg Oral Q M,W,F   insulin aspart  0-15 Units Subcutaneous TID AC & HS   insulin aspart  3 Units Subcutaneous TID WC   ipratropium-albuterol  3 mL Nebulization Q6H   methylPREDNISolone (SOLU-MEDROL) injection  40 mg Intravenous BID   Followed by   Melene Muller ON 12/22/2022] predniSONE  40 mg Oral Q breakfast   multivitamin  1 tablet Oral QHS   neomycin-polymyxin b-dexamethasone  1 Application Right Eye TID   omega-3 acid ethyl esters  1 g Oral Daily   pantoprazole (PROTONIX) IV  40 mg Intravenous Q12H   Followed by   Melene Muller ON 12/23/2022] pantoprazole  40 mg Oral Daily   rosuvastatin  10 mg Oral Once per day on Monday Wednesday Friday   sodium chloride flush  3 mL Intravenous Q12H    Infusions:  sodium chloride      PRN Medications: sodium chloride, acetaminophen **OR** acetaminophen, calamine, calcium carbonate **FOLLOWED BY** [START ON 12/22/2022] calcium carbonate, diphenhydrAMINE-zinc acetate, fentaNYL (SUBLIMAZE) injection, hydrocortisone cream, hydrOXYzine, morphine injection, nitroGLYCERIN, ondansetron **OR** ondansetron (ZOFRAN)  IV, oxyCODONE, polyethylene glycol, sodium chloride flush, traZODone  Assessment/Plan  1. Acute on chronic diastolic CHF: With prominent RV failure.  Echo this admission with EF 55-60%, mild LVH, moderate RV enlargement with mildly decreased RV systolic function, PASP 73 mmHg, severe LAE, mild-moderate TR, IVC dilated.  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 With significant volume overload, he was started on milrinone for RV support (no PICC with CKD IV) and diuresed with Lasix gtt.  Initial good response but UOP fell off  significantly and creatinine up to 4.2, started CVVH on 9/12.  RHC 9/11 showed both right and left sided failure but RV failure predominated with RA pressure > 20. Milrinone was weaned and patient is showing to be HD dependent.  - Volume status well maintained with HD + oral lasix. Management of HD and diuretics per Renal. Euvolemic on exam today.   2. AKI on CKD stage IV>>ESRD: Complicates CHF.   As above, difficult situation with severe predominantly RV failure/volume overload and cardiorenal syndrome.  Started on CVVH on 9/12 with UOP falling off despite high doses of diuretics, transitioned to iHD. - Resumed on iHD, his frailty and cardiopulmonary comorbid conditions make long term dialysis difficult.  - Management as above  3.  Atrial fibrillation: Permanent. Bradycardic currently - Continue Apixaban 2.5 bid - Remains bradycardiac - Ventricular rates 40-50s but go into 60-70 range on standing. Nighttime brady into 30s is asymptomatic - No role for PPM currently especially in light of need for HD and additional nidus for blood stream infection  4. CAD: Remote PCI to LAD and diagonal.  .  - No s/s angina Continue statin - No ASA with apixaban use.  - Chest pain overnight, resolved with slg NTG. Previously hydral/Imdur held to promote renal recovery. Now with ESRD. Ok to restart Imdur, will do 30 mg daily (previously on 30 mg TID, wife worried about higher doses)  5. 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   - Management as above   Length of Stay: 25  Kyle Bleacher, NP  12/21/2022, 8:04 AM  Advanced Heart Failure Team Pager [phone] (M-F; 7a - 5p)  Please contact [facility] for night-coverage after hours (5p -7a ) and weekends on [email or web address]  Patient seen with NP, agree with the above note.   Cardiology called back to see patient due to chest pain yesterday. Patient had episode lasting about 1 hour.  HS-TnI not significantly elevated and no trend.  No  ECG changes.  This morning, no chest pain.    He is now getting TTS HD.   General: NAD, frail Neck: No JVD, no thyromegaly or thyroid nodule.  Lungs: Clear to auscultation bilaterally with normal respiratory effort. CV: Nondisplaced PMI.  Heart regular S1/S2, no S3/S4, no murmur.  No peripheral edema.   Abdomen: Soft, nontender, no hepatosplenomegaly, no distention.  Skin: Intact without lesions or rashes.  Neurologic: Alert and oriented x 3.  Psych: Normal affect. Extremities: No clubbing or cyanosis.  HEENT: Normal.   I do not think this was ACS.  Will start back on Imdur 60 mg daily.  Continue Crestor.  No ASA given Eliquis use.   He is in chronic AF, rate remains in 40s.  BP stable.  Off all nodal blockers.  No indication for PPM at this time but follow closely.  Risk for PPM infection would be higher with HD.   Tolerating HD so far, getting TTS.    Very weak, plan  for rehab placement.   Kyle Roberson 12/21/2022 9:00 AM

## 2022-12-21 NOTE — Progress Notes (Signed)
[facility]  ROUNDING NOTE   Subjective:   Patient seen sitting up in bed Alert and oriented Wife at bedside Complained of chest pain overnight   Objective:  Vital signs in last 24 hours:  Temp:  [97.3 F (36.3 C)-98.3 F (36.8 C)] 97.8 F (36.6 C) (09/30 1301) Pulse Rate:  [45-92] 70 (09/30 1301) Resp:  [16-20] 17 (09/30 1301) BP: (128-140)/(56-66) 140/64 (09/30 1301) SpO2:  [95 %-100 %] 100 % (09/30 1301)  Weight change:  Filed Weights   12/15/22 1000 12/15/22 1849 12/18/22 0500  Weight: 40.3 kg 37.3 kg 106.6 kg    Intake/Output: I/O last 3 completed shifts: In: 240 [P.O.:240] Out: 500 [Urine:500]   Intake/Output this shift:  No intake/output data recorded.  Physical Exam: General: NAD  Head: Normocephalic, atraumatic. Moist oral mucosal membranes  Eyes: Anicteric, ectropion  Lungs:  Diminished, room air  Heart: S1S2 no rubs  Abdomen:  Soft, nontender  Extremities: 1+ peripheral edema.  Neurologic: Awake, alert  Skin: No lesions  Access: Rt Chest permcath    Basic Metabolic Panel: Recent Labs  Lab 12/17/22 0743 12/18/22 0525 12/19/22 0723 12/19/22 0903 12/20/22 0830 12/20/22 2144 12/21/22 0525  NA 136 136 136 137 135 136 135  K 4.6 4.0 3.8 3.4* 3.8 4.3 4.4  CL 98 97* 100 99 101 99 98  CO2 25 28 26 27 26 25 24   GLUCOSE 84 100* 135* 208* 139* 299* 293*  BUN 55* 38* 49* 51* 31* 43* 52*  CREATININE 4.09* 2.97* 3.67* 3.87* 2.80* 3.69* 3.99*  CALCIUM 8.4* 8.4* 8.3* 8.3* 8.0* 8.6* 8.6*  MG 1.9  --  1.8  --   --  1.7  --   PHOS 6.2* 4.4 5.5* 5.5* 3.6  --  4.4    Liver Function Tests: Recent Labs  Lab 12/18/22 0525 12/19/22 0723 12/19/22 0903 12/20/22 0830 12/21/22 0525  ALBUMIN 3.1* 3.2* 3.2* 3.0* 3.2*   No results for input(s): "LIPASE", "AMYLASE" in the last 168 hours. No results for input(s): "AMMONIA" in the last 168 hours.  CBC: Recent Labs  Lab 12/15/22 0729 12/17/22 0743 12/19/22 0723 12/19/22 0903 12/20/22 2144   WBC 8.1 7.2 7.9 8.0 6.4  HGB 9.5* 9.3* 10.1* 9.6* 10.1*  HCT 30.4* 29.4* 33.0* 31.4* 32.0*  MCV 95.6 95.5 96.5 97.5 95.8  PLT 194 146* 141* 142* 163    Cardiac Enzymes: No results for input(s): "CKTOTAL", "CKMB", "CKMBINDEX", "TROPONINI" in the last 168 hours.  BNP: Invalid input(s): "POCBNP"  CBG: Recent Labs  Lab 12/20/22 1216 12/20/22 1656 12/20/22 2134 12/21/22 0730 12/21/22 1257  GLUCAP 136* 192* 273* 280* 338*    Microbiology: Results for orders placed or performed during the hospital encounter of 11/26/22  MRSA Next Gen by PCR, Nasal     Status: None   Collection Time: 11/30/22  4:10 PM   Specimen: Nasal Mucosa; Nasal Swab  Result Value Ref Range Status   MRSA by PCR Next Gen NOT DETECTED NOT DETECTED Final    Comment: (NOTE) The GeneXpert MRSA Assay (FDA approved for NASAL specimens only), is one component of a comprehensive MRSA colonization surveillance program. It is not intended to diagnose MRSA infection nor to guide or monitor treatment for MRSA infections. Test performance is not FDA approved in patients less than 59 years old. Performed at [facility], [address] [place], [place] [address]     Coagulation Studies: Recent Labs    12/20/22 31-Jan-2143  LABPROT 18.0*  INR 1.5*  Urinalysis: No results for input(s): "COLORURINE", "LABSPEC", "PHURINE", "GLUCOSEU", "HGBUR", "BILIRUBINUR", "KETONESUR", "PROTEINUR", "UROBILINOGEN", "NITRITE", "LEUKOCYTESUR" in the last 72 hours.  Invalid input(s): "APPERANCEUR"    Imaging: DG Chest Port 1 View  Result Date: 12/20/2022 CLINICAL DATA:  Chest pain EXAM: PORTABLE CHEST 1 VIEW COMPARISON:  12/18/2022 FINDINGS: Shallow inspiration. Cardiac enlargement with mild pulmonary vascular congestion. Perihilar infiltration suggesting edema. No focal consolidation. Haziness in the costophrenic angles may indicate small pleural effusions. No pneumothorax. Right central venous catheter with tip over the  SVC region. Calcification of the aorta. IMPRESSION: Cardiac enlargement with pulmonary vascular congestion and perihilar edema. Probable small pleural effusions. Electronically Signed   By: Burman Nieves M.D.   On: 12/20/2022 22:44     Medications:    sodium chloride      apixaban  2.5 mg Oral BID   arformoterol  15 mcg Nebulization BID   vitamin C  500 mg Oral BID   budesonide (PULMICORT) nebulizer solution  0.25 mg Nebulization BID   calcium carbonate  1 tablet Oral TID WC   Chlorhexidine Gluconate Cloth  6 each Topical Daily   cholecalciferol  2,000 Units Oral Daily   clotrimazole-betamethasone   Topical BID   docusate sodium  100 mg Oral BID   doxazosin  1 mg Oral BID   epoetin (EPOGEN/PROCRIT) injection  4,000 Units Intravenous Q T,Th,Sa-HD   feeding supplement (NEPRO CARB STEADY)  237 mL Oral TID BM   furosemide  80 mg Oral Q M,W,F   insulin aspart  0-15 Units Subcutaneous TID AC & HS   insulin aspart  3 Units Subcutaneous TID WC   ipratropium-albuterol  3 mL Nebulization BID   isosorbide mononitrate  60 mg Oral Daily   methylPREDNISolone (SOLU-MEDROL) injection  40 mg Intravenous BID   Followed by   Melene Roberson ON 12/22/2022] predniSONE  40 mg Oral Q breakfast   multivitamin  1 tablet Oral QHS   neomycin-polymyxin b-dexamethasone  1 Application Right Eye TID   omega-3 acid ethyl esters  1 g Oral Daily   pantoprazole (PROTONIX) IV  40 mg Intravenous Q12H   Followed by   Melene Roberson ON 12/23/2022] pantoprazole  40 mg Oral Daily   rosuvastatin  10 mg Oral Once per day on Monday Wednesday Friday   sodium chloride flush  3 mL Intravenous Q12H   sodium chloride, acetaminophen **OR** acetaminophen, calamine, calcium carbonate **FOLLOWED BY** [START ON 12/22/2022] calcium carbonate, diphenhydrAMINE-zinc acetate, fentaNYL (SUBLIMAZE) injection, hydrocortisone cream, hydrOXYzine, morphine injection, nitroGLYCERIN, ondansetron **OR** ondansetron (ZOFRAN) IV, oxyCODONE, polyethylene glycol,  sodium chloride flush, traZODone  Assessment/ Plan:  [name] is a 82 y.o.  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 Due to progression of kidney disease and outpatient diuretic therapy failure, we feel this patient has progressed to end stage renal disease.  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 CRRT from 9/12 to 9/13 Next treatment scheduled for Tuesday.  Outpatient clinic arranged at [facility] for now. Will continue to monitor discharge plan to determine if changes are required.   Acute exacerbation of chronic diastolic congestive heart failure: complicated with acute respiratory failure and pulmonary hypertension. -Volume status improved significantly with dialysis treatments   Diabetes mellitus type II with chronic kidney disease -  holding empagliflozin  4.  Anemia of chronic kidney disease.  Low dose EPO ordered with dialysis treatments.  5.  Secondary hyperparathyroidism.  Continue to periodically monitor bone metabolism parameters.   LOS: 25 Montez Stryker 9/30/20241:32 PM

## 2022-12-21 NOTE — Plan of Care (Signed)
  Problem: Education: Goal: Ability to describe self-care measures that may prevent or decrease complications (Diabetes Survival Skills Education) will improve Outcome: Progressing   Problem: Coping: Goal: Ability to adjust to condition or change in health will improve Outcome: Progressing   Problem: Skin Integrity: Goal: Risk for impaired skin integrity will decrease Outcome: Progressing   Problem: Activity: Goal: Risk for activity intolerance will decrease Outcome: Progressing   Problem: Coping: Goal: Level of anxiety will decrease Outcome: Progressing   Problem: Clinical Measurements: Goal: Complications related to the disease process, condition or treatment will be avoided or minimized Outcome: Progressing

## 2022-12-22 DIAGNOSIS — I5033 Acute on chronic diastolic (congestive) heart failure: Secondary | ICD-10-CM | POA: Diagnosis not present

## 2022-12-22 LAB — RENAL FUNCTION PANEL
Albumin: 3 g/dL — ABNORMAL LOW (ref 3.5–5.0)
Anion gap: 11 (ref 5–15)
BUN: 71 mg/dL — ABNORMAL HIGH (ref 8–23)
CO2: 24 mmol/L (ref 22–32)
Calcium: 8.4 mg/dL — ABNORMAL LOW (ref 8.9–10.3)
Chloride: 101 mmol/L (ref 98–111)
Creatinine, Ser: 4.68 mg/dL — ABNORMAL HIGH (ref 0.61–1.24)
GFR, Estimated: 12 mL/min — ABNORMAL LOW (ref >60)
Glucose, Bld: 232 mg/dL — ABNORMAL HIGH (ref 70–99)
Phosphorus: 5.1 mg/dL — ABNORMAL HIGH (ref 2.5–4.6)
Potassium: 4.6 mmol/L (ref 3.5–5.1)
Sodium: 136 mmol/L (ref 135–145)

## 2022-12-22 LAB — CBC
HCT: 28.7 % — ABNORMAL LOW (ref 39.0–52.0)
Hemoglobin: 9.2 g/dL — ABNORMAL LOW (ref 13.0–17.0)
MCH: 29.5 pg (ref 26.0–34.0)
MCHC: 32.1 g/dL (ref 30.0–36.0)
MCV: 92 fL (ref 80.0–100.0)
Platelets: 174 10*3/uL (ref 150–400)
RBC: 3.12 MIL/uL — ABNORMAL LOW (ref 4.22–5.81)
RDW: 14.8 % (ref 11.5–15.5)
WBC: 7.9 10*3/uL (ref 4.0–10.5)
nRBC: 0 % (ref 0.0–0.2)

## 2022-12-22 LAB — MAGNESIUM: Magnesium: 2.1 mg/dL (ref 1.7–2.4)

## 2022-12-22 LAB — BASIC METABOLIC PANEL
Anion gap: 13 (ref 5–15)
BUN: 75 mg/dL — ABNORMAL HIGH (ref 8–23)
CO2: 23 mmol/L (ref 22–32)
Calcium: 8.5 mg/dL — ABNORMAL LOW (ref 8.9–10.3)
Chloride: 100 mmol/L (ref 98–111)
Creatinine, Ser: 4.91 mg/dL — ABNORMAL HIGH (ref 0.61–1.24)
GFR, Estimated: 11 mL/min — ABNORMAL LOW (ref >60)
Glucose, Bld: 246 mg/dL — ABNORMAL HIGH (ref 70–99)
Potassium: 4.7 mmol/L (ref 3.5–5.1)
Sodium: 136 mmol/L (ref 135–145)

## 2022-12-22 LAB — GLUCOSE, CAPILLARY
Glucose-Capillary: 229 mg/dL — ABNORMAL HIGH (ref 70–99)
Glucose-Capillary: 371 mg/dL — ABNORMAL HIGH (ref 70–99)
Glucose-Capillary: 179 mg/dL — ABNORMAL HIGH (ref 70–99)
Glucose-Capillary: 156 mg/dL — ABNORMAL HIGH (ref 70–99)

## 2022-12-22 LAB — PHOSPHORUS: Phosphorus: 5.5 mg/dL — ABNORMAL HIGH (ref 2.5–4.6)

## 2022-12-22 MED ORDER — EPOETIN ALFA 4000 UNIT/ML IJ SOLN
INTRAMUSCULAR | Status: AC
  Filled 2022-12-22: qty 1

## 2022-12-22 MED ORDER — HYDROXYZINE HCL 25 MG PO TABS: 25.0000 mg | ORAL_TABLET | Freq: Three times a day (TID) | ORAL | Status: DC | PRN

## 2022-12-22 MED ORDER — FLUTICASONE FUROATE-VILANTEROL 200-25 MCG/ACT IN AEPB
1.0000 | INHALATION_SPRAY | Freq: Every day | RESPIRATORY_TRACT | Status: DC
  Administered 2022-12-23 – 2023-01-04 (×10): 1 via RESPIRATORY_TRACT
  Filled 2022-12-22: qty 28

## 2022-12-22 MED ORDER — BUDESONIDE 0.25 MG/2ML IN SUSP
0.2500 mg | Freq: Two times a day (BID) | RESPIRATORY_TRACT | Status: AC
  Administered 2022-12-22: 0.25 mg via RESPIRATORY_TRACT
  Filled 2022-12-22: qty 2

## 2022-12-22 MED ORDER — EPOETIN ALFA 4000 UNIT/ML IJ SOLN
4000.0000 [IU] | INTRAMUSCULAR | Status: DC
  Administered 2022-12-22 – 2022-12-26 (×3): 4000 [IU] via INTRAVENOUS
  Filled 2022-12-22 (×2): qty 1

## 2022-12-22 MED ORDER — HEPARIN SODIUM (PORCINE) 1000 UNIT/ML DIALYSIS: 1000.0000 [IU] | INTRAMUSCULAR | Status: DC | PRN

## 2022-12-22 MED ORDER — ARFORMOTEROL TARTRATE 15 MCG/2ML IN NEBU
15.0000 ug | INHALATION_SOLUTION | Freq: Two times a day (BID) | RESPIRATORY_TRACT | Status: AC
  Administered 2022-12-22: 15 ug via RESPIRATORY_TRACT
  Filled 2022-12-22: qty 2

## 2022-12-22 MED ORDER — ALTEPLASE 2 MG IJ SOLR: 2.0000 mg | Freq: Once | INTRAMUSCULAR | Status: DC | PRN

## 2022-12-22 MED ORDER — CALCIUM CARBONATE ANTACID 500 MG PO CHEW
800.0000 mg | CHEWABLE_TABLET | Freq: Three times a day (TID) | ORAL | Status: DC | PRN
  Administered 2022-12-22 – 2022-12-23 (×2): 800 mg via ORAL
  Filled 2022-12-22 (×2): qty 4

## 2022-12-22 MED ORDER — SIMETHICONE 40 MG/0.6ML PO SUSP: 40.0000 mg | Freq: Four times a day (QID) | ORAL | Status: DC | PRN

## 2022-12-22 NOTE — Progress Notes (Signed)
[facility] Progress Note  Patient: Kyle Roberson    RKY:[id number]  DOA: 11/26/2022     Date of Service: the patient was seen and examined on 12/22/2022  No chief complaint on file.  Brief hospital course: 82yo with h/o CAD, stage 5 CKD, chronic diastolic CHF, BLE lymphedema, afib on Eliquis, PAH, and SDH presented on 09/05 with weight gain and SOB.     Hospital course / major events:  He was diagnosed with acute on chronic CHF and started on Lasix drip.  Cardiology was consulted. Echo this admission with EF 55-60%, mild LVH, moderate RV enlargement with mildly decreased RV systolic function, PASP 73 mmHg, severe LAE, mild-moderate TR, IVC dilated.  IV midodrine drip 09/09.   RHC done on 09/11 with elevated R > L filling pressures, mostly right-sided, worsening renal function and decreased UOP so Lasix stopped.  Started on CRRT 09/12 for additional volume removal and completed this. 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  He is on IV Lasix.  Remains on heparin gtt for Afib, has been bradycardic w/ ventricular rates 40-50s but go into 60-70 range on standing. Resumed Eliquis 09/24 Permcath placement 12/14/22 and now pending outpatient placement and HD plan    Consultants:  Cardiology - advanced HF team  Nephrology  PCCM Palliative care Surgery PT/OT [facility] team   Procedures: Echocardiogram 9/6 CVC 9/12 RHC on 9/11 CRRT HD 9/17, 9/18, 09/20, 09/24 Permcath placed 09/23  Assessment and Plan: Acute on chronic diastolic congestive heart failure Pulmonary HTN, severe - group 2 PH (from elevated LVEDP  Presented with volume overload with increasing weight up to 255 pounds.  Dry weight around ~220 pounds Monitor input output and creatinine fluid management w/ dialysis and oral furosemide 80mg  daily on non-HD days  Overall poor prognosis given advanced renal disease - was told 9/19 that his prognosis is weeks  without HD, 6-12 months with HD Cardiology was consulted  Acute hypoxic respiratory failure d/t volume overload / CHF/COPD Supplemental O2 to wean as able   COPD exacerbation Patient does not have formal diagnosis but patient is having wheezing for past few days, initially he refused nebulizer treatment due to tachycardia in the past On 9/28 discussed again regarding nebulizer treatment, patient and his wife agreed Lianne Moris and budesonide nebulizer treatment twice daily 9/29 started Solu-Medrol 40 mg IV twice daily for 2 doses followed by prednisone 40 mg p.o. daily for 5 days. DuoNeb every 6 hourly scheduled, transition to as needed after improvement 10/1 start Breo Ellipta tomorrow a.m. 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 daily, patient had an episode of chest pain at night on 9/29 which resolved after 2 doses of nitroglycerin. Cardiology following.   Atrial fibrillation: Permanent. Bradycardic currently Apixaban was held and patient was on heparin which was discontinued and apixaban 2.5 mg p.o. twice daily was resumed.   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     Hyperphosphatemia Binders per nephro   L ankle pain Fell in the ER Xray ordered and  negative CAM walker for use with ambulation   Hypertension Continue doxazosin, hydralazine   Hyperlipidemia Continue rosuvastatin, Lovaza     Bilateral lower extremity lymphedema, chronic Chronic skin changes, dermatitis and possible fungal infection 9/25 Started Lotrisone cream twice daily  GERD started pantoprazole 40 mg IV twice daily followed by 40 mg p.o. daily, started Tums as well  Goals of care Patient appears disinterested in aggressive care but his wife is insistent She has declined palliative care consultation, "he's not there yet" He has a very poor overall prognosis based on cardiorenal failure and is not a good long-term HD candidate Ongoing discussions are encouraged regarding code status as well as possibly EOL care    Morbid obesity Complicates overall prognosis Body mass index is 35.73 kg/m.  Nutrition Problem: Increased nutrient needs Etiology: acute illness (CRRT) Interventions: Nutrition Problem: Increased nutrient needs   Diet: Renal diet DVT Prophylaxis: Therapeutic Anticoagulation with Eliquis    Advance goals of care discussion: Full code  Family Communication: family was present at bedside, at the time of interview.  The pt provided permission to discuss medical plan with the family. Opportunity was given to ask question and all questions were answered satisfactorily.   Disposition:  Pt is from home, admitted with renal failure, developed ESRD, started hemodialysis, still volume overload, which precludes a safe discharge. Discharge to SNF, when bed will be available.    Subjective: No significant events overnight, patient was resting comfortable.  Feels improvement in the breathing.  Lower extremity edema is improving Denies any chest pain or palpitations.   Physical Exam: General: NAD, lying comfortably Appear in no distress, affect appropriate Eyes: PERRLA ENT: Oral Mucosa Clear, moist  Neck: no JVD,  Cardiovascular: Irregular rhythm,  no Murmur, bradycardic Respiratory: Equal air entry bilaterally, mild bilateral crackles, mild wheezing, significantly improved after nebulizer treatment Abdomen: Bowel Sound present, Soft and no tenderness,  Skin: no rashes Extremities: Chronic lymphedema bilateral lower extremities, with dry and scaly skin. mild tenderness Neurologic: without any new focal findings Gait not checked due to patient safety concerns  Vitals:   12/22/22 1630 12/22/22 1700 12/22/22 1725 12/22/22 1734  BP: (!) 92/53  (!) 97/58 (!) 116/58  Pulse:      Resp:    (!) 28  Temp:    97.9 F (36.6 C)  TempSrc:      SpO2: 95% 97%  97%  Weight:      Height:        Intake/Output Summary (Last 24 hours) at 12/22/2022 1800 Last data filed at 12/22/2022 1734 Gross per 24 hour  Intake 323 ml  Output 3350 ml  Net -3027 ml   Filed Weights   12/15/22 1000 12/15/22 1849 12/18/22 0500  Weight: 40.3 kg 37.3 kg 106.6 kg    Data Reviewed: I have personally reviewed and interpreted daily labs, tele strips, imagings as discussed above. I reviewed all nursing notes, pharmacy notes, vitals, pertinent old records I have discussed plan of care as described above with RN and patient/family.  CBC: Recent Labs  Lab 12/17/22 0743 12/19/22 0723 12/19/22 0903 12/20/22 2144 12/22/22 0906  WBC 7.2 7.9 8.0 6.4 7.9  HGB 9.3* 10.1* 9.6* 10.1* 9.2*  HCT  29.4* 33.0* 31.4* 32.0* 28.7*  MCV 95.5 96.5 97.5 95.8 92.0  PLT 146* 141* 142* 163 174   Basic Metabolic Panel: Recent Labs  Lab 12/17/22 0743 12/18/22 0525 12/19/22 0723 12/19/22 0903 12/20/22 0830 12/20/22 2144 12/21/22 0525 12/22/22 0554 12/22/22 0906  NA 136   < > 136 137 135 136 135 136 136  K 4.6   < > 3.8 3.4* 3.8 4.3 4.4 4.6 4.7  CL 98   < > 100 99 101 99 98 101 100  CO2 25   < > 26 27 26 25 24 24 23   GLUCOSE 84   < > 135* 208* 139* 299* 293* 232* 246*  BUN 55*   < > 49* 51* 31* 43* 52* 71* 75*  CREATININE 4.09*   < > 3.67* 3.87* 2.80* 3.69* 3.99* 4.68*  4.91*  CALCIUM 8.4*   < > 8.3* 8.3* 8.0* 8.6* 8.6* 8.4* 8.5*  MG 1.9  --  1.8  --   --  1.7  --   --  2.1  PHOS 6.2*   < > 5.5* 5.5* 3.6  --  4.4 5.1* 5.5*   < > = values in this interval not displayed.    Studies: No results found.  Scheduled Meds:  apixaban  2.5 mg Oral BID   arformoterol  15 mcg Nebulization BID   vitamin C  500 mg Oral BID   budesonide (PULMICORT) nebulizer solution  0.25 mg Nebulization BID   Chlorhexidine Gluconate Cloth  6 each Topical Daily   cholecalciferol  2,000 Units Oral Daily   clotrimazole-betamethasone   Topical BID   docusate sodium  100 mg Oral BID   doxazosin  1 mg Oral BID   epoetin (EPOGEN/PROCRIT) injection  4,000 Units Intravenous Q T,Th,Sa-HD   furosemide  80 mg Oral Q M,W,F   insulin aspart  0-15 Units Subcutaneous TID AC & HS   insulin aspart  3 Units Subcutaneous TID WC   insulin glargine-yfgn  20 Units Subcutaneous Daily   ipratropium-albuterol  3 mL Nebulization BID   isosorbide mononitrate  60 mg Oral Daily   multivitamin  1 tablet Oral QHS   neomycin-polymyxin b-dexamethasone  1 Application Right Eye TID   omega-3 acid ethyl esters  1 g Oral Daily   pantoprazole (PROTONIX) IV  40 mg Intravenous Q12H   Followed by   Melene Muller ON 12/23/2022] pantoprazole  40 mg Oral Daily   predniSONE  40 mg Oral Q breakfast   rosuvastatin  10 mg Oral Once per day on Monday Wednesday Friday   sodium chloride flush  3 mL Intravenous Q12H   Continuous Infusions:  sodium chloride     PRN Meds: sodium chloride, acetaminophen **OR** acetaminophen, alteplase, diphenhydrAMINE-zinc acetate, fentaNYL (SUBLIMAZE) injection, heparin, hydrOXYzine, morphine injection, nitroGLYCERIN, ondansetron **OR** ondansetron (ZOFRAN) IV, oxyCODONE, polyethylene glycol, simethicone, sodium chloride flush, traZODone  Time spent: 35 minutes  Author: Gillis Santa. MD [facility] 12/22/2022 6:00 PM  To reach On-call, see care teams to locate the attending and reach out  to them via [email or web address]. If 7PM-7AM, please contact night-coverage If you still have difficulty reaching the attending provider, please page the [facility] (Director on Call) for [facility] on amion for assistance.

## 2022-12-22 NOTE — Progress Notes (Signed)
Received patient in chair to unit.    Informed consent signed and in chart.    TX duration: 3.5 hrs     Transported back to floor  Hand-off given to patient's nurse.Sherre Scarlet   Access used:  rt chest CVC Access issues: n/a  Total UF removed: 3000 mls Medication(s) given: epogen 4000 units       Maple Hudson, RN Dialysis Unit

## 2022-12-22 NOTE — Progress Notes (Signed)
Nutrition Follow-up  DOCUMENTATION CODES:   Obesity unspecified  INTERVENTION:   -Continue Nepro Shake po TID, each supplement provides 425 kcal and 19 grams protein -Continue Magic cup TID with meals, each supplement provides 290 kcal and 9 grams of protein -Continue Rena-vit po daily  -Continue Vitamin C 500 mg po BID -Continue daily weights   NUTRITION DIAGNOSIS:   Increased nutrient needs related to acute illness (CRRT) as evidenced by estimated needs.  Ongoing  GOAL:   Patient will meet greater than or equal to 90% of their needs  Progressing   MONITOR:   PO intake, Supplement acceptance, Labs, Weight trends, I & O's, Skin  REASON FOR ASSESSMENT:   Consult Diet education  ASSESSMENT:   82 y/o male with h/o CHF, DM, CKD IV, HTN, CAD, BPH, GERD, SDH s/p craniotomy, hernia s/p repair, Afib, lymphaedema, HLD, depression and pulmonary hypertension who is admitted with AKI and volume overload requiring CRRT.  9/12- CRRT initiated 9/14- CRRT d/c 9/16- iHD held due to signs of renal recovery 9/17- iHD 9/23- permacath placement 9/25- s/p dialysis/permacath repair  Reviewed I/O's: -297 ml x 24 hours and since 12/08/22 -8.5 L since admission  UOP: 300 ml x 24 hours  Pt with good meal completions. Noted meal completions 50-100%.   Reviewed wt hx; noted 20.7% wt loss since admission. Noted -8.5 L since admission.   Per TOC notes, awaiting SNF choice for discharge.   Medications reviewed and include vitamin C, vitamin D3, colace, epogen, lasix, lovaza, and prednisone.   Labs reviewed: CBGS: 229-371 (inpatient orders for glycemic control are 0-15 units insulin aspart TID with meals, 3 units insulin aspart daily at bedtime, and 20 units insulin glargine-yfgn daily).    Diet Order:   Diet Order             Diet renal with fluid restriction Fluid consistency: Thin  Diet effective now                   EDUCATION NEEDS:   Education needs have been  addressed  Skin:  Skin Assessment: Skin Integrity Issues: Skin Integrity Issues:: Stage I Stage I: rt and lt buttocks  Last BM:  12/21/22 (type 6)  Height:   Ht Readings from Last 1 Encounters:  11/26/22 5\' 8"  (1.727 m)    Weight:   Wt Readings from Last 1 Encounters:  08/24/22 109.8 kg    Ideal Body Weight:  70 kg  BMI:  Body mass index is 35.73 kg/m.  Estimated Nutritional Needs:   Kcal:  2000-2300kcal/day  Protein:  100-115g/day  Fluid:  UOP +1L    Levada Schilling, RD, LDN, CDCES Registered Dietitian II Certified Diabetes Care and Education Specialist Please refer to AMION for RD and/or RD on-call/weekend/after hours pager

## 2022-12-22 NOTE — Progress Notes (Signed)
Patient ID: Kyle Roberson, male   DOB: 15-Jan-1941, 82 y.o.   MRN: [id number]     Advanced Heart Failure Rounding Note  PCP-Cardiologist: Julien Nordmann, MD   Subjective:    Now ESRD. Tolerating iHD. Perm cath placed 12/14/22  No further chest pain.  No dyspnea.  HR in 50s, atrial fibrillation.    Objective:   Weight Range: 106.6 kg Body mass index is 35.73 kg/m.   Vital Signs:   Temp:  [97.6 F (36.4 C)-98.1 F (36.7 C)] 97.7 F (36.5 C) (10/01 0733) Pulse Rate:  [52-73] 52 (10/01 0733) Resp:  [17-20] 17 (10/01 0733) BP: (115-156)/(54-71) 133/54 (10/01 0733) SpO2:  [97 %-100 %] 100 % (10/01 0733) Last BM Date : 12/18/22  Weight change: Filed Weights   12/15/22 1000 12/15/22 1849 12/18/22 0500  Weight: 40.3 kg 37.3 kg 106.6 kg   Intake/Output:   Intake/Output Summary (Last 24 hours) at 12/22/2022 0809 Last data filed at 12/22/2022 0344 Gross per 24 hour  Intake 3 ml  Output 300 ml  Net -297 ml    Physical Exam  General:  elderly, weak appearing.  No respiratory difficulty HEENT: normal Neck: supple. JVD ~7 cm. Carotids 2+ bilat; no bruits. No lymphadenopathy or thyromegaly appreciated. RIJ HD cath Cor: PMI nondisplaced. Regular rate & rhythm. No rubs, gallops or murmurs. Lungs: diminished Abdomen: soft, nontender, nondistended. No hepatosplenomegaly. No bruits or masses. Good bowel sounds. Extremities: no cyanosis, clubbing, rash, trace BLE edema  Neuro: drowsy from pain meds  Telemetry   Atrial fibrillation 50s (Personally reviewed)    Labs    Labs and imaging reviewed in Epic.  Medications:   Scheduled Medications:  apixaban  2.5 mg Oral BID   arformoterol  15 mcg Nebulization BID   vitamin C  500 mg Oral BID   budesonide (PULMICORT) nebulizer solution  0.25 mg Nebulization BID   calcium carbonate  1 tablet Oral TID WC   Chlorhexidine Gluconate Cloth  6 each Topical Daily   cholecalciferol  2,000 Units Oral Daily   clotrimazole-betamethasone    Topical BID   docusate sodium  100 mg Oral BID   doxazosin  1 mg Oral BID   epoetin (EPOGEN/PROCRIT) injection  4,000 Units Intravenous Q T,Th,Sa-HD   furosemide  80 mg Oral Q M,W,F   insulin aspart  0-15 Units Subcutaneous TID AC & HS   insulin aspart  3 Units Subcutaneous TID WC   insulin glargine-yfgn  20 Units Subcutaneous Daily   ipratropium-albuterol  3 mL Nebulization BID   isosorbide mononitrate  60 mg Oral Daily   multivitamin  1 tablet Oral QHS   neomycin-polymyxin b-dexamethasone  1 Application Right Eye TID   omega-3 acid ethyl esters  1 g Oral Daily   pantoprazole (PROTONIX) IV  40 mg Intravenous Q12H   Followed by   Melene Muller ON 12/23/2022] pantoprazole  40 mg Oral Daily   predniSONE  40 mg Oral Q breakfast   rosuvastatin  10 mg Oral Once per day on Monday Wednesday Friday   sodium chloride flush  3 mL Intravenous Q12H    Infusions:  sodium chloride      PRN Medications: sodium chloride, acetaminophen **OR** acetaminophen, calamine, calcium carbonate **FOLLOWED BY** calcium carbonate, diphenhydrAMINE-zinc acetate, fentaNYL (SUBLIMAZE) injection, hydrocortisone cream, hydrOXYzine, morphine injection, nitroGLYCERIN, ondansetron **OR** ondansetron (ZOFRAN) IV, oxyCODONE, polyethylene glycol, sodium chloride flush, traZODone  Assessment/Plan   1. Acute on chronic diastolic CHF: With prominent RV failure.  Echo this admission with EF 55-60%, mild  LVH, moderate RV enlargement with mildly decreased RV systolic function, PASP 73 mmHg, severe LAE, mild-moderate TR, IVC dilated.  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 With significant volume overload, he was started on milrinone for RV support (no PICC with CKD IV) and diuresed with Lasix gtt.  Initial good response but UOP fell off significantly and creatinine up to 4.2, started CVVH on 9/12.  RHC 9/11 showed both right and left sided failure but RV failure predominated with RA pressure > 20. Milrinone was weaned  and patient is now HD dependent. Looks near-euvolemic.  - Volume status well maintained with HD + oral lasix on non-HD days. Management of HD and diuretics per Renal. 2. AKI on CKD stage IV>>ESRD: Complicates CHF.   As above, difficult situation with severe predominantly RV failure/volume overload and cardiorenal syndrome.  Started on CVVH on 9/12 with UOP falling off despite high doses of diuretics, transitioned to iHD. - Resumed on iHD, his frailty and cardiopulmonary comorbid conditions make long term dialysis difficult.  - Management as above 3.  Atrial fibrillation: Permanent. Mild bradycardia with HR ranging 40s-60s, increases with exertion.  - Continue Apixaban 2.5 bid - No nodal blockade.  - No indication yet for PPM though may need in future.   4. CAD: Remote PCI to LAD and diagonal. No further chest pain. He thinks that his CP on Sunday was due to "indigestion."   - Continue statin - No ASA with apixaban use.  - Continue Imdur 60 mg daily.  5. 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   - Management as above  From my standpoint, ready for SNF when there is a bed.    Length of Stay: 26  Marca Ancona, MD  12/22/2022, 8:09 AM  Advanced Heart Failure Team Pager [phone] (M-F; 7a - 5p)  Please contact [facility] for night-coverage after hours (5p -7a ) and weekends on [email or web address]

## 2022-12-22 NOTE — Plan of Care (Signed)
  Problem: Education: Goal: Ability to describe self-care measures that may prevent or decrease complications (Diabetes Survival Skills Education) will improve Outcome: Progressing Goal: Individualized Educational Video(s) Outcome: Progressing   Problem: Coping: Goal: Ability to adjust to condition or change in health will improve Outcome: Progressing   Problem: Fluid Volume: Goal: Ability to maintain a balanced intake and output will improve Outcome: Progressing   Problem: Health Behavior/Discharge Planning: Goal: Ability to identify and utilize available resources and services will improve Outcome: Progressing Goal: Ability to manage health-related needs will improve Outcome: Progressing   Problem: Metabolic: Goal: Ability to maintain appropriate glucose levels will improve Outcome: Progressing   Problem: Nutritional: Goal: Maintenance of adequate nutrition will improve Outcome: Progressing Goal: Progress toward achieving an optimal weight will improve Outcome: Progressing   Problem: Skin Integrity: Goal: Risk for impaired skin integrity will decrease Outcome: Progressing   Problem: Tissue Perfusion: Goal: Adequacy of tissue perfusion will improve Outcome: Progressing   Problem: Education: Goal: Knowledge of General Education information will improve Description: Including pain rating scale, medication(s)/side effects and non-pharmacologic comfort measures Outcome: Progressing   Problem: Health Behavior/Discharge Planning: Goal: Ability to manage health-related needs will improve Outcome: Progressing   Problem: Clinical Measurements: Goal: Ability to maintain clinical measurements within normal limits will improve Outcome: Progressing Goal: Will remain free from infection Outcome: Progressing Goal: Diagnostic test results will improve Outcome: Progressing Goal: Respiratory complications will improve Outcome: Progressing Goal: Cardiovascular complication will  be avoided Outcome: Progressing   Problem: Activity: Goal: Risk for activity intolerance will decrease Outcome: Progressing   Problem: Nutrition: Goal: Adequate nutrition will be maintained Outcome: Progressing   Problem: Coping: Goal: Level of anxiety will decrease Outcome: Progressing   Problem: Elimination: Goal: Will not experience complications related to bowel motility Outcome: Progressing Goal: Will not experience complications related to urinary retention Outcome: Progressing   Problem: Pain Managment: Goal: General experience of comfort will improve Outcome: Progressing   Problem: Safety: Goal: Ability to remain free from injury will improve Outcome: Progressing   Problem: Skin Integrity: Goal: Risk for impaired skin integrity will decrease Outcome: Progressing   Problem: Education: Goal: Understanding of CV disease, CV risk reduction, and recovery process will improve Outcome: Progressing Goal: Individualized Educational Video(s) Outcome: Progressing   Problem: Activity: Goal: Ability to return to baseline activity level will improve Outcome: Progressing   Problem: Cardiovascular: Goal: Ability to achieve and maintain adequate cardiovascular perfusion will improve Outcome: Progressing Goal: Vascular access site(s) Level 0-1 will be maintained Outcome: Progressing   Problem: Health Behavior/Discharge Planning: Goal: Ability to safely manage health-related needs after discharge will improve Outcome: Progressing   Problem: Education: Goal: Knowledge of disease and its progression will improve Outcome: Progressing   Problem: Fluid Volume: Goal: Compliance with measures to maintain balanced fluid volume will improve Outcome: Progressing   Problem: Health Behavior/Discharge Planning: Goal: Ability to manage health-related needs will improve Outcome: Progressing   Problem: Nutritional: Goal: Ability to make healthy dietary choices will  improve Outcome: Progressing   Problem: Clinical Measurements: Goal: Complications related to the disease process, condition or treatment will be avoided or minimized Outcome: Progressing

## 2022-12-22 NOTE — Progress Notes (Signed)
Physical Therapy Treatment Patient Details Name: Kyle Roberson MRN: [id number] DOB: June 21, 1940 Today's Date: 12/22/2022   History of Present Illness Kyle Roberson is an 81yoM who comes [facility] from OP nephrology on 9/5 due to LEE and ABD swelling. PMH: CKD, CHF. As of 9/9 he was transferred to ICU for IV milrinone gtt per [facility] heart failure MD. Started CRRT this admission and transitioned to HD.    PT Comments  Pt is received in bed with wife at bedside, he is agreeable to PT session. Pt performs bed mobility min A x2 and transfers min-max A x2. Pt able to perform x4 STS with first rep requiring min A x2 and last few reps mod A x2. Pt continues to demonstrate mod posterior lean during standing phase requiring multimodal cuing to promote upright posture. Frequent rest breaks between sets required due to reports of 6/10 modified RPE. Nephrology MD present mid-session and encouraged Pt to be in recliner for upcoming HD appoint. Pt required max A x2 during stand pivot transfer from bed to recliner secondary to increased fatigue. Overall, Pt demonstrates steadily progress towards PT goals as seen by increase activity tolerance and motivation to participate in session. Pt would benefit from cont skilled PT to address above deficits and promote optimal return to PLOF.    If plan is discharge home, recommend the following: Assist for transportation;Help with stairs or ramp for entrance;Assistance with cooking/housework;Two people to help with walking and/or transfers;Two people to help with bathing/dressing/bathroom   Can travel by private vehicle     No  Equipment Recommendations  None recommended by PT    Recommendations for Other Services       Precautions / Restrictions Precautions Precautions: Fall Restrictions Weight Bearing Restrictions: No     Mobility  Bed Mobility Overal bed mobility: Needs Assistance Bed Mobility: Supine to Sit     Supine to sit: +2 for physical assistance,  Min assist Sit to supine: Min assist, +2 for physical assistance   General bed mobility comments: assist with BLE and trunk management    Transfers Overall transfer level: Needs assistance Equipment used: Rolling walker (2 wheels) Transfers: Sit to/from Stand, Bed to chair/wheelchair/BSC Sit to Stand: Min assist, +2 physical assistance, Mod assist          Lateral/Scoot Transfers: Max assist, +2 physical assistance General transfer comment: x4 STS with min A x2 for first STS with focus on AD management and slight posterior lean-last 3 STS required mod A x2 secondary to increase fatigue    Ambulation/Gait               General Gait Details: Deferred at this time   Stairs             Wheelchair Mobility     Tilt Bed    Modified Rankin (Stroke Patients Only)       Balance Overall balance assessment: Needs assistance Sitting-balance support: Feet supported Sitting balance-Leahy Scale: Good Sitting balance - Comments: able to maintain seated EOB balance during functional activities but required CGA for increased stability Postural control: Posterior lean Standing balance support: Bilateral upper extremity supported, During functional activity, Reliant on assistive device for balance Standing balance-Leahy Scale: Fair Standing balance comment: frequent cuing for upright posture; heavy reliance on AD and mod posterior lean requiring tactile cues for weight shifting forward  Cognition Arousal: Alert Behavior During Therapy: WFL for tasks assessed/performed Overall Cognitive Status: Within Functional Limits for tasks assessed                                 General Comments: AO x4; pleasant and motivated for PT        Exercises      General Comments General comments (skin integrity, edema, etc.): maintained SpO2 mid-90% on RA throughout session      Pertinent Vitals/Pain Pain Assessment Pain  Assessment: No/denies pain    Home Living                          Prior Function            PT Goals (current goals can now be found in the care plan section) Acute Rehab PT Goals Patient Stated Goal: to get better PT Goal Formulation: With patient/family Time For Goal Achievement: 12/30/22 Potential to Achieve Goals: Fair Progress towards PT goals: Progressing toward goals    Frequency    7X/week      PT Plan      Co-evaluation              AM-PAC PT "6 Clicks" Mobility   Outcome Measure  Help needed turning from your back to your side while in a flat bed without using bedrails?: A Lot Help needed moving from lying on your back to sitting on the side of a flat bed without using bedrails?: A Lot Help needed moving to and from a bed to a chair (including a wheelchair)?: A Lot Help needed standing up from a chair using your arms (e.g., wheelchair or bedside chair)?: A Lot Help needed to walk in hospital room?: Total Help needed climbing 3-5 steps with a railing? : Total 6 Click Score: 10    End of Session Equipment Utilized During Treatment: Gait belt Activity Tolerance: Patient limited by fatigue Patient left: in chair;with call bell/phone within reach;with chair alarm set;with family/visitor present Nurse Communication: Mobility status PT Visit Diagnosis: Unsteadiness on feet (R26.81);Muscle weakness (generalized) (M62.81)     Time: 9528-4132 PT Time Calculation (min) (ACUTE ONLY): 39 min  Charges:                            Kyle Roberson, SPT    Kyle Roberson 12/22/2022, 1:10 PM

## 2022-12-22 NOTE — Progress Notes (Signed)
[facility]  ROUNDING NOTE   Subjective:   Patient seen laying in bed Alert and oriented Denies pain or discomfort Tolerating meals without nausea or vomiting  Room air Participating with therapy   Objective:  Vital signs in last 24 hours:  Temp:  [97.6 F (36.4 C)-98.1 F (36.7 C)] 97.7 F (36.5 C) (10/01 0733) Pulse Rate:  [52-73] 55 (10/01 0827) Resp:  [16-20] 16 (10/01 0827) BP: (115-156)/(54-71) 133/54 (10/01 0733) SpO2:  [95 %-100 %] 95 % (10/01 0901)  Weight change:  Filed Weights   12/15/22 1000 12/15/22 1849 12/18/22 0500  Weight: 40.3 kg 37.3 kg 106.6 kg    Intake/Output: I/O last 3 completed shifts: In: 3 [I.V.:3] Out: 550 [Urine:550]   Intake/Output this shift:  No intake/output data recorded.  Physical Exam: General: NAD  Head: Normocephalic, atraumatic. Moist oral mucosal membranes  Eyes: Anicteric, ectropion  Lungs:  Diminished, room air  Heart: S1S2 no rubs  Abdomen:  Soft, nontender  Extremities: 1+ peripheral edema.  Neurologic: Awake, alert  Skin: No lesions  Access: Rt Chest permcath    Basic Metabolic Panel: Recent Labs  Lab 12/17/22 0743 12/18/22 0525 12/19/22 0723 12/19/22 0903 12/20/22 0830 12/20/22 2144 12/21/22 0525 12/22/22 0554 12/22/22 0906  NA 136   < > 136 137 135 136 135 136 136  K 4.6   < > 3.8 3.4* 3.8 4.3 4.4 4.6 4.7  CL 98   < > 100 99 101 99 98 101 100  CO2 25   < > 26 27 26 25 24 24 23   GLUCOSE 84   < > 135* 208* 139* 299* 293* 232* 246*  BUN 55*   < > 49* 51* 31* 43* 52* 71* 75*  CREATININE 4.09*   < > 3.67* 3.87* 2.80* 3.69* 3.99* 4.68* 4.91*  CALCIUM 8.4*   < > 8.3* 8.3* 8.0* 8.6* 8.6* 8.4* 8.5*  MG 1.9  --  1.8  --   --  1.7  --   --  2.1  PHOS 6.2*   < > 5.5* 5.5* 3.6  --  4.4 5.1* 5.5*   < > = values in this interval not displayed.    Liver Function Tests: Recent Labs  Lab 12/19/22 0723 12/19/22 0903 12/20/22 0830 12/21/22 0525 12/22/22 0554  ALBUMIN 3.2* 3.2* 3.0* 3.2* 3.0*    No results for input(s): "LIPASE", "AMYLASE" in the last 168 hours. No results for input(s): "AMMONIA" in the last 168 hours.  CBC: Recent Labs  Lab 12/17/22 0743 12/19/22 0723 12/19/22 0903 12/20/22 2144 12/22/22 0906  WBC 7.2 7.9 8.0 6.4 7.9  HGB 9.3* 10.1* 9.6* 10.1* 9.2*  HCT 29.4* 33.0* 31.4* 32.0* 28.7*  MCV 95.5 96.5 97.5 95.8 92.0  PLT 146* 141* 142* 163 174    Cardiac Enzymes: No results for input(s): "CKTOTAL", "CKMB", "CKMBINDEX", "TROPONINI" in the last 168 hours.  BNP: Invalid input(s): "POCBNP"  CBG: Recent Labs  Lab 12/21/22 0730 12/21/22 1257 12/21/22 1600 12/21/22 2139 12/22/22 0738  GLUCAP 280* 338* 351* 320* 229*    Microbiology: Results for orders placed or performed during the hospital encounter of 11/26/22  MRSA Next Gen by PCR, Nasal     Status: None   Collection Time: 11/30/22  4:10 PM   Specimen: Nasal Mucosa; Nasal Swab  Result Value Ref Range Status   MRSA by PCR Next Gen NOT DETECTED NOT DETECTED Final    Comment: (NOTE) The GeneXpert MRSA Assay (FDA approved for NASAL specimens only), is  one component of a comprehensive MRSA colonization surveillance program. It is not intended to diagnose MRSA infection nor to guide or monitor treatment for MRSA infections. Test performance is not FDA approved in patients less than 76 years old. Performed at [facility], [address] [place], [place] [address]     Coagulation Studies: Recent Labs    12/20/22 2143/02/02  LABPROT 18.0*  INR 1.5*     Urinalysis: No results for input(s): "COLORURINE", "LABSPEC", "PHURINE", "GLUCOSEU", "HGBUR", "BILIRUBINUR", "KETONESUR", "PROTEINUR", "UROBILINOGEN", "NITRITE", "LEUKOCYTESUR" in the last 72 hours.  Invalid input(s): "APPERANCEUR"    Imaging: DG Chest Port 1 View  Result Date: 12/20/2022 CLINICAL DATA:  Chest pain EXAM: PORTABLE CHEST 1 VIEW COMPARISON:  12/18/2022 FINDINGS: Shallow inspiration. Cardiac enlargement with mild  pulmonary vascular congestion. Perihilar infiltration suggesting edema. No focal consolidation. Haziness in the costophrenic angles may indicate small pleural effusions. No pneumothorax. Right central venous catheter with tip over the SVC region. Calcification of the aorta. IMPRESSION: Cardiac enlargement with pulmonary vascular congestion and perihilar edema. Probable small pleural effusions. Electronically Signed   By: Burman Nieves M.D.   On: 12/20/2022 22:44     Medications:    sodium chloride      apixaban  2.5 mg Oral BID   arformoterol  15 mcg Nebulization BID   vitamin C  500 mg Oral BID   budesonide (PULMICORT) nebulizer solution  0.25 mg Nebulization BID   Chlorhexidine Gluconate Cloth  6 each Topical Daily   cholecalciferol  2,000 Units Oral Daily   clotrimazole-betamethasone   Topical BID   docusate sodium  100 mg Oral BID   doxazosin  1 mg Oral BID   epoetin (EPOGEN/PROCRIT) injection  4,000 Units Intravenous Q T,Th,Sa-HD   furosemide  80 mg Oral Q M,W,F   insulin aspart  0-15 Units Subcutaneous TID AC & HS   insulin aspart  3 Units Subcutaneous TID WC   insulin glargine-yfgn  20 Units Subcutaneous Daily   ipratropium-albuterol  3 mL Nebulization BID   isosorbide mononitrate  60 mg Oral Daily   multivitamin  1 tablet Oral QHS   neomycin-polymyxin b-dexamethasone  1 Application Right Eye TID   omega-3 acid ethyl esters  1 g Oral Daily   pantoprazole (PROTONIX) IV  40 mg Intravenous Q12H   Followed by   Melene Muller ON 12/23/2022] pantoprazole  40 mg Oral Daily   predniSONE  40 mg Oral Q breakfast   rosuvastatin  10 mg Oral Once per day on Monday Wednesday Friday   sodium chloride flush  3 mL Intravenous Q12H   sodium chloride, acetaminophen **OR** acetaminophen, diphenhydrAMINE-zinc acetate, fentaNYL (SUBLIMAZE) injection, hydrOXYzine, morphine injection, nitroGLYCERIN, ondansetron **OR** ondansetron (ZOFRAN) IV, oxyCODONE, polyethylene glycol, sodium chloride flush,  traZODone  Assessment/ Plan:  [name] is a 82 y.o.  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 Due to progression of kidney disease and outpatient diuretic therapy failure, we feel this patient has progressed to end stage renal disease.  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 CRRT from 9/12 to 9/13 Will perform dialysis later today, seated in chair. Next treatment scheduled for Thursday. Outpatient clinic arranged at [facility] for now. Will continue to monitor discharge plan to determine if  changes are required.   Acute exacerbation of chronic diastolic congestive heart failure: complicated with acute respiratory failure and pulmonary hypertension. -Fluid volume managed with dialysis and diuresis.   Diabetes mellitus type II with chronic kidney disease - holding empagliflozin - Glucose elevated, primary team to mange SSI - Receiving steroids.   4.  Anemia of chronic kidney disease. Hgb 9.2, acceptable. Continue low dose EPO ordered with dialysis treatments.  5.  Secondary hyperparathyroidism.  Continue to periodically monitor bone metabolism parameters.  Calcium and phosphorus within desired range.    LOS: 26 Kyle Roberson 10/1/202412:03 PM

## 2022-12-23 DIAGNOSIS — E1122 Type 2 diabetes mellitus with diabetic chronic kidney disease: Secondary | ICD-10-CM | POA: Diagnosis not present

## 2022-12-23 DIAGNOSIS — E669 Obesity, unspecified: Secondary | ICD-10-CM

## 2022-12-23 DIAGNOSIS — I5033 Acute on chronic diastolic (congestive) heart failure: Secondary | ICD-10-CM | POA: Diagnosis not present

## 2022-12-23 DIAGNOSIS — E8770 Fluid overload, unspecified: Secondary | ICD-10-CM | POA: Diagnosis not present

## 2022-12-23 LAB — RENAL FUNCTION PANEL
Albumin: 3.3 g/dL — ABNORMAL LOW (ref 3.5–5.0)
Anion gap: 10 (ref 5–15)
BUN: 47 mg/dL — ABNORMAL HIGH (ref 8–23)
CO2: 27 mmol/L (ref 22–32)
Calcium: 8.1 mg/dL — ABNORMAL LOW (ref 8.9–10.3)
Chloride: 96 mmol/L — ABNORMAL LOW (ref 98–111)
Creatinine, Ser: 3.2 mg/dL — ABNORMAL HIGH (ref 0.61–1.24)
GFR, Estimated: 18 mL/min — ABNORMAL LOW (ref >60)
Glucose, Bld: 189 mg/dL — ABNORMAL HIGH (ref 70–99)
Phosphorus: 4 mg/dL (ref 2.5–4.6)
Potassium: 3.9 mmol/L (ref 3.5–5.1)
Sodium: 133 mmol/L — ABNORMAL LOW (ref 135–145)

## 2022-12-23 LAB — GLUCOSE, CAPILLARY
Glucose-Capillary: 173 mg/dL — ABNORMAL HIGH (ref 70–99)
Glucose-Capillary: 197 mg/dL — ABNORMAL HIGH (ref 70–99)
Glucose-Capillary: 176 mg/dL — ABNORMAL HIGH (ref 70–99)
Glucose-Capillary: 243 mg/dL — ABNORMAL HIGH (ref 70–99)

## 2022-12-23 NOTE — Plan of Care (Signed)
  Problem: Education: Goal: Ability to describe self-care measures that may prevent or decrease complications (Diabetes Survival Skills Education) will improve Outcome: Progressing   Problem: Coping: Goal: Ability to adjust to condition or change in health will improve Outcome: Progressing   Problem: Fluid Volume: Goal: Ability to maintain a balanced intake and output will improve Outcome: Progressing   Problem: Clinical Measurements: Goal: Complications related to the disease process, condition or treatment will be avoided or minimized Outcome: Progressing   Problem: Pain Managment: Goal: General experience of comfort will improve Outcome: Progressing   Problem: Safety: Goal: Ability to remain free from injury will improve Outcome: Progressing

## 2022-12-23 NOTE — Plan of Care (Signed)
  Problem: Education: Goal: Ability to describe self-care measures that may prevent or decrease complications (Diabetes Survival Skills Education) will improve Outcome: Progressing Goal: Individualized Educational Video(s) Outcome: Progressing   Problem: Coping: Goal: Ability to adjust to condition or change in health will improve Outcome: Progressing   Problem: Fluid Volume: Goal: Ability to maintain a balanced intake and output will improve Outcome: Progressing   Problem: Health Behavior/Discharge Planning: Goal: Ability to identify and utilize available resources and services will improve Outcome: Progressing Goal: Ability to manage health-related needs will improve Outcome: Progressing   Problem: Metabolic: Goal: Ability to maintain appropriate glucose levels will improve Outcome: Progressing   Problem: Nutritional: Goal: Maintenance of adequate nutrition will improve Outcome: Progressing Goal: Progress toward achieving an optimal weight will improve Outcome: Progressing   Problem: Skin Integrity: Goal: Risk for impaired skin integrity will decrease Outcome: Progressing   Problem: Tissue Perfusion: Goal: Adequacy of tissue perfusion will improve Outcome: Progressing   Problem: Education: Goal: Knowledge of General Education information will improve Description: Including pain rating scale, medication(s)/side effects and non-pharmacologic comfort measures Outcome: Progressing   Problem: Health Behavior/Discharge Planning: Goal: Ability to manage health-related needs will improve Outcome: Progressing   Problem: Clinical Measurements: Goal: Ability to maintain clinical measurements within normal limits will improve Outcome: Progressing Goal: Will remain free from infection Outcome: Progressing Goal: Diagnostic test results will improve Outcome: Progressing Goal: Respiratory complications will improve Outcome: Progressing Goal: Cardiovascular complication will  be avoided Outcome: Progressing   Problem: Activity: Goal: Risk for activity intolerance will decrease Outcome: Progressing   Problem: Nutrition: Goal: Adequate nutrition will be maintained Outcome: Progressing   Problem: Coping: Goal: Level of anxiety will decrease Outcome: Progressing   Problem: Elimination: Goal: Will not experience complications related to bowel motility Outcome: Progressing Goal: Will not experience complications related to urinary retention Outcome: Progressing   Problem: Pain Managment: Goal: General experience of comfort will improve Outcome: Progressing   Problem: Safety: Goal: Ability to remain free from injury will improve Outcome: Progressing   Problem: Skin Integrity: Goal: Risk for impaired skin integrity will decrease Outcome: Progressing   Problem: Education: Goal: Understanding of CV disease, CV risk reduction, and recovery process will improve Outcome: Progressing Goal: Individualized Educational Video(s) Outcome: Progressing   Problem: Activity: Goal: Ability to return to baseline activity level will improve Outcome: Progressing   Problem: Cardiovascular: Goal: Ability to achieve and maintain adequate cardiovascular perfusion will improve Outcome: Progressing Goal: Vascular access site(s) Level 0-1 will be maintained Outcome: Progressing   Problem: Health Behavior/Discharge Planning: Goal: Ability to safely manage health-related needs after discharge will improve Outcome: Progressing   Problem: Education: Goal: Knowledge of disease and its progression will improve Outcome: Progressing   Problem: Fluid Volume: Goal: Compliance with measures to maintain balanced fluid volume will improve Outcome: Progressing   Problem: Health Behavior/Discharge Planning: Goal: Ability to manage health-related needs will improve Outcome: Progressing   Problem: Nutritional: Goal: Ability to make healthy dietary choices will  improve Outcome: Progressing   Problem: Clinical Measurements: Goal: Complications related to the disease process, condition or treatment will be avoided or minimized Outcome: Progressing

## 2022-12-23 NOTE — Progress Notes (Signed)
[facility]  ROUNDING NOTE   Subjective:   Patient sitting up in bed, alert and oriented Wife at bedside States he has been able to sit at side of bed and ambulate short distances with therapy. Was able to sit in chair for dialysis treatment yesterday    Objective:  Vital signs in last 24 hours:  Temp:  [97.6 F (36.4 C)-98.7 F (37.1 C)] 97.7 F (36.5 C) (10/02 1105) Pulse Rate:  [60-79] 64 (10/02 1105) Resp:  [14-28] 18 (10/02 1105) BP: (92-141)/(48-71) 127/71 (10/02 1105) SpO2:  [92 %-100 %] 96 % (10/02 1105) FiO2 (%):  [21 %] 21 % (10/01 2049)  Weight change:  Filed Weights   12/15/22 1000 12/15/22 1849 12/18/22 0500  Weight: 40.3 kg 37.3 kg 106.6 kg    Intake/Output: I/O last 3 completed shifts: In: 323 [P.O.:320; I.V.:3] Out: 3350 [Urine:350; Other:3000]   Intake/Output this shift:  Total I/O In: 240 [P.O.:240] Out: 100 [Urine:100]  Physical Exam: General: NAD  Head: Normocephalic, atraumatic. Moist oral mucosal membranes  Eyes: Anicteric, ectropion  Lungs:  Diminished, room air  Heart: S1S2 no rubs  Abdomen:  Soft, nontender  Extremities: 1+ peripheral edema.  Neurologic: Awake, alert  Skin: No lesions  Access: Rt Chest permcath    Basic Metabolic Panel: Recent Labs  Lab 12/17/22 0743 12/18/22 0525 12/19/22 0723 12/19/22 0903 12/20/22 0830 12/20/22 2144 12/21/22 0525 12/22/22 0554 12/22/22 0906 12/23/22 0540  NA 136   < > 136   < > 135 136 135 136 136 133*  K 4.6   < > 3.8   < > 3.8 4.3 4.4 4.6 4.7 3.9  CL 98   < > 100   < > 101 99 98 101 100 96*  CO2 25   < > 26   < > 26 25 24 24 23 27   GLUCOSE 84   < > 135*   < > 139* 299* 293* 232* 246* 189*  BUN 55*   < > 49*   < > 31* 43* 52* 71* 75* 47*  CREATININE 4.09*   < > 3.67*   < > 2.80* 3.69* 3.99* 4.68* 4.91* 3.20*  CALCIUM 8.4*   < > 8.3*   < > 8.0* 8.6* 8.6* 8.4* 8.5* 8.1*  MG 1.9  --  1.8  --   --  1.7  --   --  2.1  --   PHOS 6.2*   < > 5.5*   < > 3.6  --  4.4 5.1* 5.5*  4.0   < > = values in this interval not displayed.    Liver Function Tests: Recent Labs  Lab 12/19/22 0903 12/20/22 0830 12/21/22 0525 12/22/22 0554 12/23/22 0540  ALBUMIN 3.2* 3.0* 3.2* 3.0* 3.3*   No results for input(s): "LIPASE", "AMYLASE" in the last 168 hours. No results for input(s): "AMMONIA" in the last 168 hours.  CBC: Recent Labs  Lab 12/17/22 0743 12/19/22 0723 12/19/22 0903 12/20/22 2144 12/22/22 0906  WBC 7.2 7.9 8.0 6.4 7.9  HGB 9.3* 10.1* 9.6* 10.1* 9.2*  HCT 29.4* 33.0* 31.4* 32.0* 28.7*  MCV 95.5 96.5 97.5 95.8 92.0  PLT 146* 141* 142* 163 174    Cardiac Enzymes: No results for input(s): "CKTOTAL", "CKMB", "CKMBINDEX", "TROPONINI" in the last 168 hours.  BNP: Invalid input(s): "POCBNP"  CBG: Recent Labs  Lab 12/22/22 1201 12/22/22 1816 12/22/22 2009 12/23/22 0852 12/23/22 1215  GLUCAP 371* 179* 156* 173* 197*    Microbiology: Results for orders  placed or performed during the hospital encounter of 11/26/22  MRSA Next Gen by PCR, Nasal     Status: None   Collection Time: 11/30/22  4:10 PM   Specimen: Nasal Mucosa; Nasal Swab  Result Value Ref Range Status   MRSA by PCR Next Gen NOT DETECTED NOT DETECTED Final    Comment: (NOTE) The GeneXpert MRSA Assay (FDA approved for NASAL specimens only), is one component of a comprehensive MRSA colonization surveillance program. It is not intended to diagnose MRSA infection nor to guide or monitor treatment for MRSA infections. Test performance is not FDA approved in patients less than 73 years old. Performed at [facility], [address] [place], [place] [address]     Coagulation Studies: Recent Labs    12/20/22 02-03-43  LABPROT 18.0*  INR 1.5*     Urinalysis: No results for input(s): "COLORURINE", "LABSPEC", "PHURINE", "GLUCOSEU", "HGBUR", "BILIRUBINUR", "KETONESUR", "PROTEINUR", "UROBILINOGEN", "NITRITE", "LEUKOCYTESUR" in the last 72 hours.  Invalid input(s):  "APPERANCEUR"    Imaging: No results found.   Medications:    sodium chloride      apixaban  2.5 mg Oral BID   vitamin C  500 mg Oral BID   Chlorhexidine Gluconate Cloth  6 each Topical Daily   cholecalciferol  2,000 Units Oral Daily   clotrimazole-betamethasone   Topical BID   docusate sodium  100 mg Oral BID   doxazosin  1 mg Oral BID   epoetin (EPOGEN/PROCRIT) injection  4,000 Units Intravenous Q T,Th,Sa-HD   fluticasone furoate-vilanterol  1 puff Inhalation Daily   furosemide  80 mg Oral Q M,W,F   insulin aspart  0-15 Units Subcutaneous TID AC & HS   insulin aspart  3 Units Subcutaneous TID WC   insulin glargine-yfgn  20 Units Subcutaneous Daily   ipratropium-albuterol  3 mL Nebulization BID   isosorbide mononitrate  60 mg Oral Daily   multivitamin  1 tablet Oral QHS   neomycin-polymyxin b-dexamethasone  1 Application Right Eye TID   omega-3 acid ethyl esters  1 g Oral Daily   pantoprazole  40 mg Oral Daily   predniSONE  40 mg Oral Q breakfast   rosuvastatin  10 mg Oral Once per day on Monday Wednesday Friday   sodium chloride flush  3 mL Intravenous Q12H   sodium chloride, acetaminophen **OR** acetaminophen, calcium carbonate, diphenhydrAMINE-zinc acetate, hydrOXYzine, morphine injection, nitroGLYCERIN, ondansetron **OR** ondansetron (ZOFRAN) IV, oxyCODONE, polyethylene glycol, simethicone, sodium chloride flush, traZODone  Assessment/ Plan:  [name] is a 82 y.o.  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 Due to progression of kidney disease and outpatient diuretic therapy failure, we feel this  patient has progressed to end stage renal disease.  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 CRRT from 9/12 to 9/13 Patient was able to tolerate dialysis treatment yesterday seated in chair.  Next treatment scheduled for Thursday. Will continue to monitor rehab placement and determine changes needed with outpatient dialysis clinic. Outpatient clinic arranged at [facility] for now.   Acute exacerbation of chronic diastolic congestive heart failure: complicated with acute respiratory failure and pulmonary hypertension. -Fluid volume managed with dialysis and diuresis.   Diabetes mellitus type II with chronic kidney disease - holding empagliflozin -  Glucose control improved - Receiving steroids.  4.  Anemia of chronic kidney disease. Hgb 9.2, acceptable. Continue low dose EPO ordered with dialysis treatments.  5.  Secondary hyperparathyroidism.  Continue to periodically monitor bone metabolism parameters.  Will continue to monitor bone minerals    LOS: 27 Kyle Roberson 10/2/202412:55 PM

## 2022-12-23 NOTE — Progress Notes (Signed)
Physical Therapy Treatment Patient Details Name: Jaheim Leverington MRN: [id number] DOB: [birth year] Today's Date: 12/23/2022   History of Present Illness Elra Bolan is an 81yoM who comes to [facility] from OP nephrology on 9/5 due to LEE and ABD swelling. PMH: CKD, CHF. As of 9/9 he was transferred to ICU for IV milrinone gtt per [facility] heart failure MD. Started CRRT this admission and transitioned to HD.    PT Comments  Pt received in bed, he is agreeable to therapy session. Pt performs bed mobility min A x2 and transfers min A x1-mod A x2. Pt able to STS min A x1 on first rep but then requires mod A x2 for remainder of reps due to decreased endurance. Additionally, Pt able to perform a series of fw/bw and side steps towards HOB mod A x2 using RW. Attempted a standing tolerance activity but Pt only able to sustain standing position for 65 sec mod A x2 with R knee block. Overall, Pt continues to demonstrate steady progression towards PT goals with slight improvements in activity tolerance. Pt would benefit from cont skilled PT to address above deficits and promote optimal return to PLOF.   If plan is discharge home, recommend the following: Assist for transportation;Help with stairs or ramp for entrance;Assistance with cooking/housework;Two people to help with walking and/or transfers;Two people to help with bathing/dressing/bathroom   Can travel by private vehicle     No  Equipment Recommendations  None recommended by PT    Recommendations for Other Services       Precautions / Restrictions Precautions Precautions: Fall Restrictions Weight Bearing Restrictions: No RLE Weight Bearing: Touchdown weight bearing LLE Weight Bearing: Touchdown weight bearing     Mobility  Bed Mobility Overal bed mobility: Needs Assistance Bed Mobility: Supine to Sit     Supine to sit: +2 for physical assistance, Min assist Sit to supine: Min assist, +2 for physical assistance   General bed mobility  comments: assist with BLE and trunk management but improving initiation of task    Transfers Overall transfer level: Needs assistance Equipment used: Rolling walker (2 wheels) Transfers: Sit to/from Stand Sit to Stand: Min assist, +2 physical assistance, Mod assist           General transfer comment: x4 STS with min A x1 for first STS and mod A x2 for x3 STS; focus on AD managment and multimodal cuing to correct posterior push    Ambulation/Gait               General Gait Details: Deferred at this time   Stairs             Wheelchair Mobility     Tilt Bed    Modified Rankin (Stroke Patients Only)       Balance Overall balance assessment: Needs assistance Sitting-balance support: Feet supported, Single extremity supported Sitting balance-Leahy Scale: Fair Sitting balance - Comments: able to maintain seated EOB balance during functional activities but required CGA for increased stability; able to correct posterior lean during seated EOB but requires multimodal cuing Postural control: Posterior lean Standing balance support: Bilateral upper extremity supported, During functional activity, Reliant on assistive device for balance Standing balance-Leahy Scale: Fair Standing balance comment: frequent cuing for upright posture; heavy reliance on AD and mod posterior lean requiring tactile cues for weight shifting forward                            Cognition Arousal:  Alert Behavior During Therapy: WFL for tasks assessed/performed Overall Cognitive Status: Within Functional Limits for tasks assessed                                 General Comments: AO x4; pleasant and motivated for therapy        Exercises Other Exercises Other Exercises: 2x5 steps fw/bw bed<>sink mod A x2 for AD management and posterior lean; x6 steps towards HOB mod A x2 for weight shifting Other Exercises: 1x1 min standing balance, mod A x2 with R knee block     General Comments        Pertinent Vitals/Pain Pain Assessment Pain Assessment: No/denies pain    Home Living                          Prior Function            PT Goals (current goals can now be found in the care plan section) Acute Rehab PT Goals Patient Stated Goal: to get better PT Goal Formulation: With patient/family Time For Goal Achievement: 12/30/22 Potential to Achieve Goals: Fair Progress towards PT goals: Progressing toward goals    Frequency    7X/week      PT Plan      Co-evaluation PT/OT/SLP Co-Evaluation/Treatment: Yes Reason for Co-Treatment: Complexity of the patient's impairments (multi-system involvement);To address functional/ADL transfers PT goals addressed during session: Mobility/safety with mobility;Proper use of DME;Balance OT goals addressed during session: ADL's and self-care;Proper use of Adaptive equipment and DME      AM-PAC PT "6 Clicks" Mobility   Outcome Measure  Help needed turning from your back to your side while in a flat bed without using bedrails?: A Little Help needed moving from lying on your back to sitting on the side of a flat bed without using bedrails?: A Little Help needed moving to and from a bed to a chair (including a wheelchair)?: A Lot Help needed standing up from a chair using your arms (e.g., wheelchair or bedside chair)?: A Lot Help needed to walk in hospital room?: A Lot Help needed climbing 3-5 steps with a railing? : Total 6 Click Score: 13    End of Session Equipment Utilized During Treatment: Gait belt Activity Tolerance: Patient limited by fatigue Patient left: in bed;with call bell/phone within reach Nurse Communication: Mobility status PT Visit Diagnosis: Unsteadiness on feet (R26.81);Muscle weakness (generalized) (M62.81)     Time: 1914-7829 PT Time Calculation (min) (ACUTE ONLY): 25 min  Charges:                            Elmon Else, SPT    Iyanni Hepp 12/23/2022, 3:07 PM

## 2022-12-23 NOTE — TOC Progression Note (Addendum)
Transition of Care [facility]) - Progression Note    Patient Details  Name: Kyle Roberson MRN: [id number] Date of Birth: November 14, 1940  [facility] CM/SW Contact  Darolyn Rua, [place] Phone Number: 12/23/2022, 3:04 PM  Clinical Narrative:     CSW spoke with patient spouse who reports at this time they would like   [facility] inpatient rehab [facility]   Referral placed to [facility] inpatient rehab, clinicals have been faxed to [phone]. (Phone: [phone])  Expected Discharge Plan: Home/Self Care Barriers to Discharge: No Barriers Identified  Expected Discharge Plan and Services       Living arrangements for the past 2 months: Single Family Home                                       Social Determinants of Health (SDOH) Interventions SDOH Screenings   Food Insecurity: No Food Insecurity (12/01/2022)  Housing: Low Risk  (12/01/2022)  Transportation Needs: No Transportation Needs (12/01/2022)  Utilities: Not At Risk (12/01/2022)  Depression (PHQ2-9): Low Risk  (07/27/2022)  Tobacco Use: Medium Risk (11/26/2022)    Readmission Risk Interventions     No data to display

## 2022-12-23 NOTE — Progress Notes (Signed)
Progress Note   Patient: Kyle Roberson EXB:[id number] DOB: 1940-11-10 DOA: 11/26/2022     27 DOS: the patient was seen and examined on 12/23/2022   Brief hospital course: 82yo with h/o CAD, stage 5 CKD, chronic diastolic CHF, BLE lymphedema, afib on Eliquis, PAH, and SDH presented on 09/05 with weight gain and SOB.     Hospital course / major events:  He was diagnosed with acute on chronic CHF and started on Lasix drip.  Cardiology was consulted. Echo this admission with EF 55-60%, mild LVH, moderate RV enlargement with mildly decreased RV systolic function, PASP 73 mmHg, severe LAE, mild-moderate TR, IVC dilated.  IV midodrine drip 09/09.   RHC done on 09/11 with elevated R > L filling pressures, mostly right-sided, worsening renal function and decreased UOP so Lasix stopped.  Started on CRRT 09/12 for additional volume removal and completed this. 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  He is on IV Lasix.  Remains on heparin gtt for Afib, has been bradycardic w/ ventricular rates 40-50s but go into 60-70 range on standing. Resumed Eliquis 09/24 Permcath placement 12/14/22 and now pending outpatient placement and HD plan    Consultants:  Cardiology - advanced HF team  Nephrology  PCCM Palliative care Surgery PT/OT [facility] team   Procedures: Echocardiogram 9/6 CVC 9/12 RHC on 9/11 CRRT HD 9/17, 9/18, 09/20, 09/24 Permcath placed 09/23   Assessment and Plan: Acute on chronic diastolic congestive heart failure Pulmonary HTN, severe - group 2 PH (from elevated LVEDP  Presented with volume overload with increasing weight up to 255 pounds.  Dry weight around ~220 pounds Monitor input output and creatinine fluid management w/ dialysis and oral furosemide 80mg  daily on non-HD days  Overall poor prognosis given advanced renal disease - was told 9/19 that his prognosis is weeks without HD, 6-12 months with  HD Cardiology on board, continue HD for fluid management.   Acute hypoxic respiratory failure d/t volume overload / CHF/COPD Supplemental O2 to wean as able    COPD exacerbation Patient does not have formal diagnosis but patient is having wheezing for past few days, initially he refused nebulizer treatment due to tachycardia in the past On 9/28 discussed again regarding nebulizer treatment, patient and his wife agreed Lianne Moris and budesonide nebulizer treatment twice daily 9/29 started Solu-Medrol 40 mg IV twice daily for 2 doses followed by prednisone 40 mg p.o. daily for 5 days. DuoNeb every 6 hourly scheduled, transition to as needed after improvement 10/1 start Breo Ellipta tomorrow a.m. 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 Dialysis following catheter placement 09/23 which became nonfunctional on 9/24 Overall very poor prognosis 9/25 permanent HD catheter was placed by vascular surgery   CAD with remote PCI Continue statin, No ASA 9/30 resumed Imdur 60 mg p.o. daily, patient had an episode of chest pain at night on 9/29 which resolved after 2 doses of nitroglycerin. Cardiology following.   Atrial fibrillation: Permanent. Bradycardic currently Apixaban was held and patient was on heparin which was discontinued and apixaban 2.5 mg p.o. twice daily was resumed.   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     Hyperphosphatemia Binders per nephro   L ankle pain Fell in the ER Xray ordered  and negative CAM walker for use with ambulation   Hypertension Continue doxazosin, hydralazine   Hyperlipidemia Continue rosuvastatin, Lovaza   Bilateral lower extremity lymphedema, chronic Chronic skin changes, dermatitis and possible fungal infection 9/25 Started Lotrisone cream twice daily   GERD started pantoprazole 40 mg IV twice daily followed by 40 mg p.o. daily, started Tums as well   Goals of care Patient appears disinterested in aggressive care but his wife is insistent She has declined palliative care consultation, "he's not there yet" He has a very poor overall prognosis based on cardiorenal failure and is not a good long-term HD candidate Ongoing discussions are encouraged regarding code status as well as possibly EOL care    Obesity Complicates overall prognosis Body mass index is 35.73 kg/m.  Diet, exercise and weight reduction advised. Fluid management per HD.      Out of bed to chair. Incentive spirometry. Nursing supportive care. Fall, aspiration precautions. DVT prophylaxis   Code Status: Full Code  Subjective: Patient is seen and examined today morning.  He is lying comfortably, shortness of breath better.  He is on room air.  Eating fair.  Wife at bedside.  Physical Exam: Vitals:   12/23/22 0337 12/23/22 0850 12/23/22 1105 12/23/22 1512  BP: (!) 126/54 (!) 136/58 127/71 (!) 105/46  Pulse: 66 61 64 64  Resp: 14 18 18 18   Temp: 98.7 F (37.1 C) 97.7 F (36.5 C) 97.7 F (36.5 C) 97.7 F (36.5 C)  TempSrc:      SpO2: 92% 94% 96% 94%  Weight:      Height:        General - Elderly obese  Caucasian male, no apparent distress HEENT - PERRLA, EOMI, atraumatic head, non tender sinuses. Lung - Clear, bibasal rales, rhonchi, no wheezes. Heart - S1, S2 heard, no murmurs, rubs, 2+  pedal edema. Abdomen - Soft, non tender, non distended, bowel sounds good Neuro - Alert, awake and oriented x 3, non focal exam. Skin - Warm and dry. Chronic lower  extremity skin changes, lymphedema  Data Reviewed:      Latest Ref Rng & Units 12/22/2022    9:06 AM 12/20/2022    9:44 PM 12/19/2022    9:03 AM  CBC  WBC 4.0 - 10.5 K/uL 7.9  6.4  8.0   Hemoglobin 13.0 - 17.0 g/dL 9.2  16.1  9.6   Hematocrit 39.0 - 52.0 % 28.7  32.0  31.4   Platelets 150 - 400 K/uL 174  163  142       Latest Ref Rng & Units 12/23/2022    5:40 AM 12/22/2022    9:06 AM 12/22/2022    5:54 AM  BMP  Glucose 70 - 99 mg/dL 096  045  409   BUN 8 - 23 mg/dL 47  75  71   Creatinine 0.61 - 1.24 mg/dL 8.11  9.14  7.82   Sodium 135 - 145 mmol/L 133  136  136   Potassium 3.5 - 5.1 mmol/L 3.9  4.7  4.6   Chloride 98 - 111 mmol/L 96  100  101   CO2 22 - 32 mmol/L 27  23  24    Calcium 8.9 -  10.3 mg/dL 8.1  8.5  8.4    No results found.   Family Communication: Discussed with patient, wife at bedside, they understand and agree. All questions answereed.    Disposition: Status is: Inpatient Remains inpatient appropriate because: HD, SNF placement  Planned Discharge Destination: Skilled nursing facility     Time spent: 38 minutes  Author: Marcelino Duster, MD 12/23/2022 4:56 PM Secure chat 7am to 7pm For on call review [email or web address].

## 2022-12-23 NOTE — Progress Notes (Signed)
Occupational Therapy Treatment Patient Details Name: Kyle Roberson MRN: [id number] DOB: [birth year] Today's Date: 12/23/2022   History of present illness Kyle Roberson is an 81yoM who comes to [facility] from OP nephrology on 9/5 due to LEE and ABD swelling. PMH: CKD, CHF. As of 9/9 he was transferred to ICU for IV milrinone gtt per [facility] heart failure MD. Started CRRT this admission and transitioned to HD.   OT comments  Kyle Roberson was seen for OT/PT co-treatment on this date. Upon arrival to room pt reclined in bed, agreeable to tx. Pt requires SETUP for UB washing, MAX A for LB bathing in sitting. Tolerates x4 sit<>stands, initial stand MIN A, decreases to MOD A x2 for x3 stands as pt fatigues. Tolerates 65" standing and side steps forward/backwards. Pt making good progress toward goals, will continue to follow POC. Discharge recommendation remains appropriate.        If plan is discharge home, recommend the following:  A lot of help with bathing/dressing/bathroom;Assistance with cooking/housework;Assist for transportation;Help with stairs or ramp for entrance;Two people to help with walking and/or transfers   Equipment Recommendations  BSC/3in1    Recommendations for Other Services      Precautions / Restrictions Precautions Precautions: Fall Restrictions Weight Bearing Restrictions: No RLE Weight Bearing: Touchdown weight bearing LLE Weight Bearing: Touchdown weight bearing       Mobility Bed Mobility Overal bed mobility: Needs Assistance Bed Mobility: Supine to Sit, Sit to Supine     Supine to sit: Min assist Sit to supine: Mod assist        Transfers Overall transfer level: Needs assistance Equipment used: Rolling walker (2 wheels) Transfers: Sit to/from Stand Sit to Stand: Mod assist, +2 physical assistance           General transfer comment: initial stand MIN A, decreases to MOD A x2 for x3 stands as pt fatigues     Balance Overall balance assessment:  Needs assistance Sitting-balance support: Feet supported, Single extremity supported Sitting balance-Leahy Scale: Fair   Postural control: Posterior lean Standing balance support: Bilateral upper extremity supported, During functional activity, Reliant on assistive device for balance Standing balance-Leahy Scale: Poor Standing balance comment: posterior lean                           ADL either performed or assessed with clinical judgement   ADL Overall ADL's : Needs assistance/impaired                                       General ADL Comments: SETUP for UB washing, MAX A for LB bathing in sitting.      Cognition Arousal: Alert Behavior During Therapy: WFL for tasks assessed/performed Overall Cognitive Status: Within Functional Limits for tasks assessed                                                     Pertinent Vitals/ Pain       Pain Assessment Pain Assessment: No/denies pain   Frequency  Min 1X/week        Progress Toward Goals  OT Goals(current goals can now be found in the care plan section)  Progress towards OT goals: Progressing toward goals  Acute  Rehab OT Goals Patient Stated Goal: to go home OT Goal Formulation: With patient/family Time For Goal Achievement: 12/30/22 Potential to Achieve Goals: Good ADL Goals Pt Will Perform Lower Body Dressing: with caregiver independent in assisting;sit to/from stand Pt Will Transfer to Toilet: ambulating;bedside commode;with supervision Pt Will Perform Toileting - Clothing Manipulation and hygiene: with caregiver independent in assisting Additional ADL Goal #1: Pt will verbalize plan to implement at least 2 learned ECS/falls prevention strategies to maximize safety/indep with ADL and mobility.  Plan      Co-evaluation    PT/OT/SLP Co-Evaluation/Treatment: Yes Reason for Co-Treatment: Complexity of the patient's impairments (multi-system involvement);To address  functional/ADL transfers PT goals addressed during session: Mobility/safety with mobility;Proper use of DME;Balance OT goals addressed during session: ADL's and self-care;Proper use of Adaptive equipment and DME      AM-PAC OT "6 Clicks" Daily Activity     Outcome Measure   Help from another person eating meals?: None Help from another person taking care of personal grooming?: A Little Help from another person toileting, which includes using toliet, bedpan, or urinal?: A Lot Help from another person bathing (including washing, rinsing, drying)?: A Lot Help from another person to put on and taking off regular upper body clothing?: A Little Help from another person to put on and taking off regular lower body clothing?: A Lot 6 Click Score: 16    End of Session    OT Visit Diagnosis: Other abnormalities of gait and mobility (R26.89);Muscle weakness (generalized) (M62.81);Pain   Activity Tolerance Patient tolerated treatment well   Patient Left in bed;with call bell/phone within reach   Nurse Communication          Time: 6045-4098 OT Time Calculation (min): 26 min  Charges: OT General Charges $OT Visit: 1 Visit OT Treatments $Self Care/Home Management : 8-22 mins  Kathie Dike, M.S. OTR/L  12/23/22, 4:03 PM  ascom [phone]

## 2022-12-24 DIAGNOSIS — I5033 Acute on chronic diastolic (congestive) heart failure: Secondary | ICD-10-CM | POA: Diagnosis not present

## 2022-12-24 DIAGNOSIS — E669 Obesity, unspecified: Secondary | ICD-10-CM | POA: Diagnosis not present

## 2022-12-24 DIAGNOSIS — E1122 Type 2 diabetes mellitus with diabetic chronic kidney disease: Secondary | ICD-10-CM | POA: Diagnosis not present

## 2022-12-24 DIAGNOSIS — E8770 Fluid overload, unspecified: Secondary | ICD-10-CM | POA: Diagnosis not present

## 2022-12-24 LAB — RENAL FUNCTION PANEL
Albumin: 3 g/dL — ABNORMAL LOW (ref 3.5–5.0)
Anion gap: 14 (ref 5–15)
BUN: 79 mg/dL — ABNORMAL HIGH (ref 8–23)
CO2: 25 mmol/L (ref 22–32)
Calcium: 7.9 mg/dL — ABNORMAL LOW (ref 8.9–10.3)
Chloride: 97 mmol/L — ABNORMAL LOW (ref 98–111)
Creatinine, Ser: 4.22 mg/dL — ABNORMAL HIGH (ref 0.61–1.24)
GFR, Estimated: 13 mL/min — ABNORMAL LOW (ref >60)
Glucose, Bld: 190 mg/dL — ABNORMAL HIGH (ref 70–99)
Phosphorus: 5.1 mg/dL — ABNORMAL HIGH (ref 2.5–4.6)
Potassium: 3.6 mmol/L (ref 3.5–5.1)
Sodium: 136 mmol/L (ref 135–145)

## 2022-12-24 LAB — CBC WITH DIFFERENTIAL/PLATELET
Abs Immature Granulocytes: 0.11 10*3/uL — ABNORMAL HIGH (ref 0.00–0.07)
Basophils Absolute: 0 10*3/uL (ref 0.0–0.1)
Basophils Relative: 0 %
Eosinophils Absolute: 0.1 10*3/uL (ref 0.0–0.5)
Eosinophils Relative: 2 %
HCT: 28.8 % — ABNORMAL LOW (ref 39.0–52.0)
Hemoglobin: 9 g/dL — ABNORMAL LOW (ref 13.0–17.0)
Immature Granulocytes: 1 %
Lymphocytes Relative: 7 %
Lymphs Abs: 0.6 10*3/uL — ABNORMAL LOW (ref 0.7–4.0)
MCH: 29.7 pg (ref 26.0–34.0)
MCHC: 31.3 g/dL (ref 30.0–36.0)
MCV: 95 fL (ref 80.0–100.0)
Monocytes Absolute: 0.7 10*3/uL (ref 0.1–1.0)
Monocytes Relative: 9 %
Neutro Abs: 6.8 10*3/uL (ref 1.7–7.7)
Neutrophils Relative %: 81 %
Platelets: 191 10*3/uL (ref 150–400)
RBC: 3.03 MIL/uL — ABNORMAL LOW (ref 4.22–5.81)
RDW: 14.9 % (ref 11.5–15.5)
WBC: 8.3 10*3/uL (ref 4.0–10.5)
nRBC: 0 % (ref 0.0–0.2)

## 2022-12-24 LAB — GLUCOSE, CAPILLARY
Glucose-Capillary: 148 mg/dL — ABNORMAL HIGH (ref 70–99)
Glucose-Capillary: 148 mg/dL — ABNORMAL HIGH (ref 70–99)
Glucose-Capillary: 198 mg/dL — ABNORMAL HIGH (ref 70–99)
Glucose-Capillary: 292 mg/dL — ABNORMAL HIGH (ref 70–99)

## 2022-12-24 MED ORDER — HEPARIN SODIUM (PORCINE) 1000 UNIT/ML DIALYSIS: 1000.0000 [IU] | INTRAMUSCULAR | Status: DC | PRN

## 2022-12-24 MED ORDER — HYDRALAZINE HCL 25 MG PO TABS
25.0000 mg | ORAL_TABLET | Freq: Three times a day (TID) | ORAL | Status: DC
  Administered 2022-12-24 – 2023-01-04 (×28): 25 mg via ORAL
  Filled 2022-12-24 (×29): qty 1

## 2022-12-24 MED ORDER — EPOETIN ALFA 4000 UNIT/ML IJ SOLN
INTRAMUSCULAR | Status: AC
  Filled 2022-12-24: qty 1

## 2022-12-24 MED ORDER — IPRATROPIUM-ALBUTEROL 0.5-2.5 (3) MG/3ML IN SOLN: 3.0000 mL | RESPIRATORY_TRACT | Status: DC | PRN

## 2022-12-24 MED ORDER — HEPARIN SODIUM (PORCINE) 1000 UNIT/ML IJ SOLN
INTRAMUSCULAR | Status: AC
  Filled 2022-12-24: qty 10

## 2022-12-24 MED ORDER — ALTEPLASE 2 MG IJ SOLR: 2.0000 mg | Freq: Once | INTRAMUSCULAR | Status: DC | PRN

## 2022-12-24 NOTE — Progress Notes (Addendum)
Patient ID: Kyle Roberson, male   DOB: 1940/08/30, 82 y.o.   MRN: [id number]     Advanced Heart Failure Rounding Note  PCP-Cardiologist: Julien Nordmann, MD   Subjective:    Now ESRD. Tolerating iHD. Perm cath placed 12/14/22  No further chest pain.  No dyspnea.  HR in 60s, atrial fibrillation.   Feels ok this morning, struggling to eat breakfast (assisted to set him up).   Objective:   Weight Range: 106.6 kg Body mass index is 35.73 kg/m.   Vital Signs:   Temp:  [97.6 F (36.4 C)-98 F (36.7 C)] 97.7 F (36.5 C) (10/03 0750) Pulse Rate:  [58-79] 58 (10/03 0750) Resp:  [18-20] 18 (10/03 0750) BP: (105-144)/(46-71) 135/50 (10/03 0750) SpO2:  [94 %-97 %] 97 % (10/03 0750) FiO2 (%):  [21 %] 21 % (10/02 2004) Last BM Date : 12/23/22  Weight change: Filed Weights   12/15/22 1000 12/15/22 1849 12/18/22 0500  Weight: 40.3 kg 37.3 kg 106.6 kg   Intake/Output:   Intake/Output Summary (Last 24 hours) at 12/24/2022 0758 Last data filed at 12/24/2022 0700 Gross per 24 hour  Intake 720 ml  Output 250 ml  Net 470 ml    Physical Exam  General:  elderly/weak appearing.  No respiratory difficulty HEENT: normal Neck: supple. JVD ~8 cm. Carotids 2+ bilat; no bruits. No lymphadenopathy or thyromegaly appreciated. RIJ HD cath Cor: PMI nondisplaced. Regular rate & irregular rhythm. No rubs, gallops or murmurs. Lungs: clear, diminished bases Abdomen: obese, soft, nontender, nondistended. No hepatosplenomegaly. No bruits or masses. Good bowel sounds. Extremities: no cyanosis, clubbing, rash, edema  Neuro: alert & oriented x 3, cranial nerves grossly intact. moves all 4 extremities w/o difficulty. Affect pleasant.   Telemetry   Atrial fibrillation 60s (Personally reviewed)    Labs    Labs and imaging reviewed in Epic.  Medications:   Scheduled Medications:  apixaban  2.5 mg Oral BID   vitamin C  500 mg Oral BID   Chlorhexidine Gluconate Cloth  6 each Topical Daily    cholecalciferol  2,000 Units Oral Daily   clotrimazole-betamethasone   Topical BID   docusate sodium  100 mg Oral BID   doxazosin  1 mg Oral BID   epoetin (EPOGEN/PROCRIT) injection  4,000 Units Intravenous Q T,Th,Sa-HD   fluticasone furoate-vilanterol  1 puff Inhalation Daily   furosemide  80 mg Oral Q M,W,F   insulin aspart  0-15 Units Subcutaneous TID AC & HS   insulin aspart  3 Units Subcutaneous TID WC   insulin glargine-yfgn  20 Units Subcutaneous Daily   ipratropium-albuterol  3 mL Nebulization BID   isosorbide mononitrate  60 mg Oral Daily   multivitamin  1 tablet Oral QHS   neomycin-polymyxin b-dexamethasone  1 Application Right Eye TID   omega-3 acid ethyl esters  1 g Oral Daily   pantoprazole  40 mg Oral Daily   predniSONE  40 mg Oral Q breakfast   rosuvastatin  10 mg Oral Once per day on Monday Wednesday Friday   sodium chloride flush  3 mL Intravenous Q12H    Infusions:  sodium chloride      PRN Medications: sodium chloride, acetaminophen **OR** acetaminophen, calcium carbonate, diphenhydrAMINE-zinc acetate, hydrOXYzine, morphine injection, nitroGLYCERIN, ondansetron **OR** ondansetron (ZOFRAN) IV, oxyCODONE, polyethylene glycol, simethicone, sodium chloride flush, traZODone  Assessment/Plan   1. Acute on chronic diastolic CHF: With prominent RV failure.  Echo this admission with EF 55-60%, mild LVH, moderate RV enlargement with mildly decreased RV  systolic function, PASP 73 mmHg, severe LAE, mild-moderate TR, IVC dilated.  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 With significant volume overload, he was started on milrinone for RV support (no PICC with CKD IV) and diuresed with Lasix gtt.  Initial good response but UOP fell off significantly and creatinine up to 4.2, started CVVH on 9/12.  RHC 9/11 showed both right and left sided failure but RV failure predominated with RA pressure > 20. Milrinone was weaned and patient is now HD dependent. Looks  near-euvolemic.  - Volume status well maintained with HD + oral lasix on non-HD days. Management of HD and diuretics per Renal. - restart hydralazine at 25 mg TID, SBP 130s-140s 2. AKI on CKD stage IV>>ESRD: Complicates CHF.   As above, difficult situation with severe predominantly RV failure/volume overload and cardiorenal syndrome.  Started on CVVH on 9/12 with UOP falling off despite high doses of diuretics, transitioned to iHD. - Resumed on iHD, his frailty and cardiopulmonary comorbid conditions make long term dialysis difficult.  - Management as above 3.  Atrial fibrillation: Permanent. Mild bradycardia with HR ranging 40s-60s, increases with exertion.  - Continue Apixaban 2.5 bid - No nodal blockade.  - No indication yet for PPM though may need in future.   4. CAD: Remote PCI to LAD and diagonal. No further chest pain. He thinks that his CP on Sunday was due to "indigestion."   - Continue statin - No ASA with apixaban use.  - Continue Imdur 60 mg daily.  5. 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   - Management as above  From my standpoint, ready for SNF when there is a bed.   Length of Stay: [phone], NP  12/24/2022, 7:58 AM  Advanced Heart Failure Team Pager [phone] (M-F; 7a - 5p)  Please contact [facility] for night-coverage after hours (5p -7a ) and weekends on [email or web address]  Patient seen with NP, agree with the above note.   BP elevated.  No complaints.   General: NAD, frail.  Neck: No JVD, no thyromegaly or thyroid nodule.  Lungs: Clear to auscultation bilaterally with normal respiratory effort. CV: Nondisplaced PMI.  Heart irregular S1/S2, no S3/S4, no murmur.  No peripheral edema.   Abdomen: Soft, nontender, no hepatosplenomegaly, no distention.  Skin: Intact without lesions or rashes.  Neurologic: Alert and oriented x 3.  Psych: Normal affect. Extremities: No clubbing or cyanosis.  HEENT: Normal.   Stable today, to get HD.   Agree with restarting hydralazine 25 tid.   Ready for SNF.  Cardiology to sign off, call with questions.  Will make followup appt.   Marca Ancona 12/24/2022 8:40 AM

## 2022-12-24 NOTE — Progress Notes (Signed)
  Received patient in bed to unit.   Informed consent signed and in chart.    TX duration: 3.5hrs     Transported back to floor  Hand-off given to patient's nurse. No c/o and no distress noted    Access used: R HD catheter Access issues: none   Total UF removed: 3.0L Medication(s) given: epogen 4,000u Post HD VS: 123/53 Post HD weight: 93.3kg     Lynann Beaver  [facility]

## 2022-12-24 NOTE — Progress Notes (Signed)
Inpatient Rehab Coordinator Note:  I spoke with pt's spouse, Kyle Roberson, over the phone to discuss CIR recommendations and goals/expectations of CIR stay.  We reviewed 3 hrs/day of therapy, physician follow up, and average length of stay 2 weeks (dependent upon progress) with goals of supervision mobility, min assist for ADLs.  Pt was with [place] 2 years ago following SDH.  She confirms she can provide 24/7 assist at discharge.  She would like to discuss with Kyle Roberson today once he returns from dialysis whether to pursue CIR versus SNF in [place].  Will follow  Estill Dooms, PT, DPT Admissions Coordinator [phone] 12/24/22  11:36 AM

## 2022-12-24 NOTE — Progress Notes (Signed)
Progress Note   Patient: Kyle Roberson VWU:[id number] DOB: Mar 06, 1941 DOA: 11/26/2022     28 DOS: the patient was seen and examined on 12/24/2022   Brief hospital course: 81yo with h/o CAD, stage 5 CKD, chronic diastolic CHF, BLE lymphedema, afib on Eliquis, PAH, and SDH presented on 09/05 with weight gain and SOB.     Hospital course / major events:  He was diagnosed with acute on chronic CHF and started on Lasix drip.  Cardiology was consulted. Echo this admission with EF 55-60%, mild LVH, moderate RV enlargement with mildly decreased RV systolic function, PASP 73 mmHg, severe LAE, mild-moderate TR, IVC dilated.  IV midodrine drip 09/09.   RHC done on 09/11 with elevated R > L filling pressures, mostly right-sided, worsening renal function and decreased UOP so Lasix stopped.  Started on CRRT 09/12 for additional volume removal and completed this. 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  He is on IV Lasix.  Remains on heparin gtt for Afib, has been bradycardic w/ ventricular rates 40-50s but go into 60-70 range on standing. Resumed Eliquis 09/24 Permcath placement 12/14/22 and now pending outpatient placement and HD plan    Consultants:  Cardiology - advanced HF team  Nephrology  PCCM Palliative care Surgery PT/OT [facility] team   Procedures: Echocardiogram 9/6 CVC 9/12 RHC on 9/11 CRRT HD 9/17, 9/18, 09/20, 09/24 Permcath placed 09/23   Assessment and Plan: Acute on chronic diastolic congestive heart failure Pulmonary HTN, severe - group 2 PH (from elevated LVEDP  Presented with volume overload with increasing weight up to 255 pounds.  Dry weight around ~220 pounds Monitor input output and creatinine fluid management w/ dialysis and oral furosemide 80mg  daily on non-HD days  Overall poor prognosis given advanced renal disease - was told 9/19 that his prognosis is weeks without HD, 6-12 months with  HD Cardiology on board, continue HD for fluid management.   Acute hypoxic respiratory failure d/t volume overload / CHF/COPD Supplemental O2 to wean as able    COPD exacerbation Patient does not have formal diagnosis but patient is having wheezing for past few days, initially he refused nebulizer treatment due to tachycardia in the past On 9/28 discussed again regarding nebulizer treatment, patient and his wife agreed Lianne Moris and budesonide nebulizer treatment twice daily 9/29 started Solu-Medrol 40 mg IV twice daily for 2 doses followed by prednisone 40 mg p.o. daily for 5 days. DuoNeb every 6 hourly scheduled, transition to as needed after improvement 10/1 start Breo Ellipta tomorrow a.m. 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 He is on TTS schedule. Dialysis following catheter placement 09/23 which became nonfunctional on 9/24 Overall very poor prognosis 9/25 permanent HD catheter was placed by vascular surgery. Continue HD per nephrology team.   CAD with remote PCI Continue statin, No ASA 9/30 resumed Imdur 60 mg p.o. daily, patient had an episode of chest pain at night on 9/29 which resolved after 2 doses of nitroglycerin. Cardiology following.   Atrial fibrillation: Permanent. Bradycardic currently Apixaban was held and patient was on heparin which was discontinued and apixaban 2.5 mg p.o. twice daily was resumed.   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     Hyperphosphatemia Binders  per nephro   L ankle pain Fell in the ER Xray ordered and negative CAM walker for use with ambulation   Hypertension Continue doxazosin, hydralazine   Hyperlipidemia Continue rosuvastatin, Lovaza   Bilateral lower extremity lymphedema, chronic Chronic skin changes, dermatitis and possible fungal infection 9/25 Started Lotrisone cream twice daily   GERD started pantoprazole 40 mg IV twice daily followed by 40 mg p.o. daily, started Tums as well   Goals of care Patient appears disinterested in aggressive care but his wife is insistent She has declined palliative care consultation, "he's not there yet" He has a very poor overall prognosis based on cardiorenal failure and is not a good long-term HD candidate Ongoing discussions are encouraged regarding code status as well as possibly EOL care    Obesity Complicates overall prognosis Body mass index is 35.73 kg/m.  Diet, exercise and weight reduction advised. Fluid management per HD.      Out of bed to chair. Incentive spirometry. Nursing supportive care. Fall, aspiration precautions. DVT prophylaxis   Code Status: Full Code  Subjective: Patient is seen and examined today during HD.  He is sleeping comfortably. No overnight issues. Tolerating HD well.  Physical Exam: Vitals:   12/24/22 1315 12/24/22 1326 12/24/22 1401 12/24/22 1625  BP: (!) 98/53 (!) 123/53  137/60  Pulse: 68 67  60  Resp: (!) 25 (!) 25  16  Temp:  (!) 97.3 F (36.3 C)  97.8 F (36.6 C)  TempSrc:  Oral  Oral  SpO2: 97% 97%  96%  Weight:   93.3 kg   Height:        General - Elderly obese  Caucasian male, no apparent distress HEENT - PERRLA, EOMI, atraumatic head, non tender sinuses. Lung - Clear, bibasal rales, rhonchi, no wheezes. Heart - S1, S2 heard, no murmurs, rubs, 2+  pedal edema. Abdomen - Soft, non tender, non distended, bowel sounds good Neuro - Alert, awake and oriented x 3, non focal exam. Skin - Warm and dry. Chronic lower  extremity skin changes, lymphedema  Data Reviewed:      Latest Ref Rng & Units 12/24/2022    9:50 AM 12/22/2022    9:06 AM 12/20/2022    9:44 PM  CBC  WBC 4.0 - 10.5 K/uL 8.3  7.9  6.4   Hemoglobin 13.0 - 17.0 g/dL 9.0  9.2  16.1   Hematocrit 39.0 - 52.0 % 28.8  28.7  32.0   Platelets 150 - 400 K/uL 191  174  163       Latest Ref Rng & Units 12/24/2022    9:50 AM 12/23/2022    5:40 AM 12/22/2022    9:06 AM  BMP  Glucose 70 - 99 mg/dL 096  045  409   BUN 8 - 23 mg/dL 79  47  75   Creatinine 0.61 - 1.24 mg/dL 8.11  9.14  7.82   Sodium 135 - 145 mmol/L 136  133  136   Potassium 3.5 - 5.1 mmol/L 3.6  3.9  4.7   Chloride 98 - 111 mmol/L 97  96  100   CO2 22 - 32 mmol/L 25  27  23    Calcium 8.9 - 10.3 mg/dL  7.9  8.1  8.5    No results found.   Family Communication: Discussed with patient, wife at bedside, they understand and agree. All questions answereed.    Disposition: Status is: Inpatient Remains inpatient appropriate because: HD, SNF placement  Planned Discharge Destination: Skilled nursing facility     Time spent: 37 minutes  Author: Marcelino Duster, MD 12/24/2022 6:37 PM Secure chat 7am to 7pm For on call review [email or web address].

## 2022-12-24 NOTE — Progress Notes (Signed)
Physical Therapy Treatment Patient Details Name: Kyle Roberson MRN: [id number] DOB: [birth year] Today's Date: 12/24/2022   History of Present Illness Cutler Patriarca is an 81yoM who comes to [facility] from OP nephrology on 9/5 due to LEE and ABD swelling. PMH: CKD, CHF. As of 9/9 he was transferred to ICU for IV milrinone gtt per [facility] heart failure MD. Started CRRT this admission and transitioned to HD.    PT Comments  Pt is received in recliner following HD, he is agreeable to PT session. At beginning of session Pt reports feeling "tired but willing to do PT"; no reports of pain. Pt performs transfers mod A x2 due to generalized weakness. Pt performed 2x STS mod-max A x1 and requires mod A x2 for step pivot transfer due to poor endurance. Pt's activity tolerance impacted following HD as expected. Overall, Pt cont to demonstrate steady improvements towards PT goals as seen by improved participation and motivation. Pt would benefit from cont skilled PT to address above deficits and promote optimal return to PLOF.    If plan is discharge home, recommend the following: Assist for transportation;Help with stairs or ramp for entrance;Assistance with cooking/housework;Two people to help with walking and/or transfers;Two people to help with bathing/dressing/bathroom   Can travel by private vehicle     No  Equipment Recommendations  None recommended by PT (TBD at next facility)    Recommendations for Other Services       Precautions / Restrictions Precautions Precautions: Fall Restrictions Weight Bearing Restrictions: No     Mobility  Bed Mobility Overal bed mobility: Needs Assistance Bed Mobility: Sit to Supine       Sit to supine: Mod assist   General bed mobility comments: assist with BLE and trunk management but steadily improving with initiation of task    Transfers Overall transfer level: Needs assistance Equipment used: Rolling walker (2 wheels) Transfers: Bed to  chair/wheelchair/BSC     Step pivot transfers: Mod assist, +2 physical assistance, Max assist       General transfer comment: able to perform 2x STS from recliner mod-max x1 for fatigued following second rep; SPT from recliner to bed mod A x2 for safety due to poor endurance    Ambulation/Gait               General Gait Details: Deferred at this time   Stairs             Wheelchair Mobility     Tilt Bed    Modified Rankin (Stroke Patients Only)       Balance Overall balance assessment: Needs assistance Sitting-balance support: Feet supported, Single extremity supported Sitting balance-Leahy Scale: Fair Sitting balance - Comments: able to maintain seated EOB balance during functional activities but required CGA for safety; cuing required to correct posterior lean during seated EOB Postural control: Posterior lean Standing balance support: Bilateral upper extremity supported, During functional activity, Reliant on assistive device for balance Standing balance-Leahy Scale: Fair Standing balance comment: posterior lean; heavy reliance on AD                            Cognition Arousal: Alert Behavior During Therapy: WFL for tasks assessed/performed Overall Cognitive Status: Within Functional Limits for tasks assessed                                 General Comments: AO x4; pleasant  and cooperative for therapy        Exercises      General Comments        Pertinent Vitals/Pain Pain Assessment Pain Assessment: No/denies pain    Home Living                          Prior Function            PT Goals (current goals can now be found in the care plan section) Acute Rehab PT Goals Patient Stated Goal: to get better PT Goal Formulation: With patient/family Time For Goal Achievement: 12/30/22 Potential to Achieve Goals: Good Progress towards PT goals: Progressing toward goals    Frequency    Min  1X/week      PT Plan      Co-evaluation              AM-PAC PT "6 Clicks" Mobility   Outcome Measure  Help needed turning from your back to your side while in a flat bed without using bedrails?: A Little Help needed moving from lying on your back to sitting on the side of a flat bed without using bedrails?: A Little Help needed moving to and from a bed to a chair (including a wheelchair)?: A Lot Help needed standing up from a chair using your arms (e.g., wheelchair or bedside chair)?: A Lot Help needed to walk in hospital room?: A Lot Help needed climbing 3-5 steps with a railing? : Total 6 Click Score: 13    End of Session Equipment Utilized During Treatment: Gait belt Activity Tolerance: Patient limited by fatigue Patient left: in bed;with call bell/phone within reach Nurse Communication: Mobility status PT Visit Diagnosis: Unsteadiness on feet (R26.81);Muscle weakness (generalized) (M62.81)     Time: 7829-5621 PT Time Calculation (min) (ACUTE ONLY): 23 min  Charges:                            Elmon Else, SPT    Geneve Kimpel 12/24/2022, 3:59 PM

## 2022-12-24 NOTE — Progress Notes (Signed)
Nutrition Follow-up  DOCUMENTATION CODES:   Obesity unspecified  INTERVENTION:   -Continue Nepro Shake po TID, each supplement provides 425 kcal and 19 grams protein -Continue Magic cup TID with meals, each supplement provides 290 kcal and 9 grams of protein -Continue Rena-vit po daily  -Continue Vitamin C 500 mg po BID -Continue daily weights   NUTRITION DIAGNOSIS:   Increased nutrient needs related to acute illness (CRRT) as evidenced by estimated needs.  Ongoing  GOAL:   Patient will meet greater than or equal to 90% of their needs  Progressing   MONITOR:   PO intake, Supplement acceptance, Labs, Weight trends, I & O's, Skin  REASON FOR ASSESSMENT:   Consult Diet education  ASSESSMENT:   82 y/o male with h/o CHF, DM, CKD IV, HTN, CAD, BPH, GERD, SDH s/p craniotomy, hernia s/p repair, Afib, lymphaedema, HLD, depression and pulmonary hypertension who is admitted with AKI and volume overload requiring CRRT.  9/12- CRRT initiated 9/14- CRRT d/c 9/16- iHD held due to signs of renal recovery 9/17- iHD 9/23- permacath placement 9/25- s/p dialysis/permacath repair  Reviewed I/O's: +470 ml x 24 hours and -8.1 L since 12/10/22  UOP: 250 ml x 24 hours   Pt continues with good oral intake. Noted meal completions 50-100%. Pt is taking supplements.   Per TOC notes plan for CIR vs SNF.   Medications reviewed and include vitamin C, vitamin D3, colace, epogen, lasix, lovaza, and prednisone.   Labs reviewed: CBGS: 148-243 (inpatient orders for glycemic control are 0-15 units insulin daily before meals and at bedtime, 3 units insulin aspart TID meals, and 20 units insulin glargine-yfgn daily).    Diet Order:   Diet Order             Diet renal with fluid restriction Fluid consistency: Thin  Diet effective now                   EDUCATION NEEDS:   Education needs have been addressed  Skin:  Skin Assessment: Skin Integrity Issues: Skin Integrity Issues:: Stage  I Stage I: rt and lt buttocks  Last BM:  12/21/22 (type 6)  Height:   Ht Readings from Last 1 Encounters:  11/26/22 5\' 8"  (1.727 m)    Weight:   Wt Readings from Last 1 Encounters:  12/24/22 93.3 kg    Ideal Body Weight:  70 kg  BMI:  Body mass index is 31.27 kg/m.  Estimated Nutritional Needs:   Kcal:  2000-2300kcal/day  Protein:  100-115g/day  Fluid:  UOP +1L    Levada Schilling, RD, LDN, CDCES Registered Dietitian III Certified Diabetes Care and Education Specialist Please refer to AMION for RD and/or RD on-call/weekend/after hours pager

## 2022-12-24 NOTE — Progress Notes (Signed)
[facility]  ROUNDING NOTE   Subjective:   Patient seen and evaluated during dialysis   HEMODIALYSIS FLOWSHEET:  Blood Flow Rate (mL/min): 399 mL/min Arterial Pressure (mmHg): -179.99 mmHg Venous Pressure (mmHg): 147.87 mmHg TMP (mmHg): 11.71 mmHg Ultrafiltration Rate (mL/min): 1053 mL/min Dialysate Flow Rate (mL/min): 300 ml/min  Tolerating treatment in recliner   Objective:  Vital signs in last 24 hours:  Temp:  [97.4 F (36.3 C)-98 F (36.7 C)] 97.4 F (36.3 C) (10/03 0942) Pulse Rate:  [56-79] 66 (10/03 1200) Resp:  [18-31] 22 (10/03 1200) BP: (101-144)/(42-66) 101/49 (10/03 1200) SpO2:  [94 %-99 %] 96 % (10/03 1200) FiO2 (%):  [21 %] 21 % (10/02 2004)  Weight change:  Filed Weights   12/15/22 1000 12/15/22 1849 12/18/22 0500  Weight: 40.3 kg 37.3 kg 106.6 kg    Intake/Output: I/O last 3 completed shifts: In: 720 [P.O.:720] Out: 250 [Urine:250]   Intake/Output this shift:  Total I/O In: 300 [P.O.:300] Out: -   Physical Exam: General: NAD  Head: Normocephalic, atraumatic. Moist oral mucosal membranes  Eyes: Anicteric, ectropion  Lungs:  Diminished, room air  Heart: S1S2 no rubs  Abdomen:  Soft, nontender  Extremities: 1+ peripheral edema.  Neurologic: Awake, alert  Skin: No lesions  Access: Rt Chest permcath    Basic Metabolic Panel: Recent Labs  Lab 12/19/22 0723 12/19/22 0903 12/20/22 2144 12/21/22 0525 12/22/22 0554 12/22/22 0906 12/23/22 0540 12/24/22 0950  NA 136   < > 136 135 136 136 133* 136  K 3.8   < > 4.3 4.4 4.6 4.7 3.9 3.6  CL 100   < > 99 98 101 100 96* 97*  CO2 26   < > 25 24 24 23 27 25   GLUCOSE 135*   < > 299* 293* 232* 246* 189* 190*  BUN 49*   < > 43* 52* 71* 75* 47* 79*  CREATININE 3.67*   < > 3.69* 3.99* 4.68* 4.91* 3.20* 4.22*  CALCIUM 8.3*   < > 8.6* 8.6* 8.4* 8.5* 8.1* 7.9*  MG 1.8  --  1.7  --   --  2.1  --   --   PHOS 5.5*   < >  --  4.4 5.1* 5.5* 4.0 5.1*   < > = values in this interval not  displayed.    Liver Function Tests: Recent Labs  Lab 12/20/22 0830 12/21/22 0525 12/22/22 0554 12/23/22 0540 12/24/22 0950  ALBUMIN 3.0* 3.2* 3.0* 3.3* 3.0*   No results for input(s): "LIPASE", "AMYLASE" in the last 168 hours. No results for input(s): "AMMONIA" in the last 168 hours.  CBC: Recent Labs  Lab 12/19/22 0723 12/19/22 0903 12/20/22 2144 12/22/22 0906 12/24/22 0950  WBC 7.9 8.0 6.4 7.9 8.3  NEUTROABS  --   --   --   --  6.8  HGB 10.1* 9.6* 10.1* 9.2* 9.0*  HCT 33.0* 31.4* 32.0* 28.7* 28.8*  MCV 96.5 97.5 95.8 92.0 95.0  PLT 141* 142* 163 174 191    Cardiac Enzymes: No results for input(s): "CKTOTAL", "CKMB", "CKMBINDEX", "TROPONINI" in the last 168 hours.  BNP: Invalid input(s): "POCBNP"  CBG: Recent Labs  Lab 12/23/22 0852 12/23/22 1215 12/23/22 1518 12/23/22 2012 12/24/22 0744  GLUCAP 173* 197* 176* 243* 148*    Microbiology: Results for orders placed or performed during the hospital encounter of 11/26/22  MRSA Next Gen by PCR, Nasal     Status: None   Collection Time: 11/30/22  4:10 PM  Specimen: Nasal Mucosa; Nasal Swab  Result Value Ref Range Status   MRSA by PCR Next Gen NOT DETECTED NOT DETECTED Final    Comment: (NOTE) The GeneXpert MRSA Assay (FDA approved for NASAL specimens only), is one component of a comprehensive MRSA colonization surveillance program. It is not intended to diagnose MRSA infection nor to guide or monitor treatment for MRSA infections. Test performance is not FDA approved in patients less than 51 years old. Performed at [facility], [address] [place], [place] [address]     Coagulation Studies: No results for input(s): "LABPROT", "INR" in the last 72 hours.    Urinalysis: No results for input(s): "COLORURINE", "LABSPEC", "PHURINE", "GLUCOSEU", "HGBUR", "BILIRUBINUR", "KETONESUR", "PROTEINUR", "UROBILINOGEN", "NITRITE", "LEUKOCYTESUR" in the last 72 hours.  Invalid input(s):  "APPERANCEUR"    Imaging: No results found.   Medications:    sodium chloride      apixaban  2.5 mg Oral BID   vitamin C  500 mg Oral BID   Chlorhexidine Gluconate Cloth  6 each Topical Daily   cholecalciferol  2,000 Units Oral Daily   clotrimazole-betamethasone   Topical BID   docusate sodium  100 mg Oral BID   doxazosin  1 mg Oral BID   epoetin (EPOGEN/PROCRIT) injection  4,000 Units Intravenous Q T,Th,Sa-HD   fluticasone furoate-vilanterol  1 puff Inhalation Daily   furosemide  80 mg Oral Q M,W,F   hydrALAZINE  25 mg Oral TID   insulin aspart  0-15 Units Subcutaneous TID AC & HS   insulin aspart  3 Units Subcutaneous TID WC   insulin glargine-yfgn  20 Units Subcutaneous Daily   isosorbide mononitrate  60 mg Oral Daily   multivitamin  1 tablet Oral QHS   neomycin-polymyxin b-dexamethasone  1 Application Right Eye TID   omega-3 acid ethyl esters  1 g Oral Daily   pantoprazole  40 mg Oral Daily   predniSONE  40 mg Oral Q breakfast   rosuvastatin  10 mg Oral Once per day on Monday Wednesday Friday   sodium chloride flush  3 mL Intravenous Q12H   sodium chloride, acetaminophen **OR** acetaminophen, alteplase, calcium carbonate, diphenhydrAMINE-zinc acetate, heparin, hydrOXYzine, ipratropium-albuterol, morphine injection, nitroGLYCERIN, ondansetron **OR** ondansetron (ZOFRAN) IV, oxyCODONE, polyethylene glycol, simethicone, sodium chloride flush, traZODone  Assessment/ Plan:  [name] is a 82 y.o.  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 Due to progression of kidney disease and outpatient diuretic therapy  failure, we feel this patient has progressed to end stage renal disease.  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 CRRT from 9/12 to 9/13 Receiving treatment in chair, UF 3L as tolerated. Next treatment scheduled for Saturday.  Outpatient clinic arranged at [facility].  Acute exacerbation of chronic diastolic congestive heart failure: complicated with acute respiratory failure and pulmonary hypertension. -Fluid volume managed with dialysis and diuresis.   Diabetes mellitus type II with chronic kidney disease - holding empagliflozin - Receiving oral steroids.  4.  Anemia of chronic kidney disease. Hgb 9.0.  Continue low dose EPO ordered with dialysis treatments.  5.  Secondary hyperparathyroidism.  Continue to periodically monitor bone metabolism parameters.  Will continue to monitor bone minerals    LOS: 28 Grantley Savage 10/3/202412:29 PM

## 2022-12-24 NOTE — TOC Progression Note (Addendum)
Transition of Care [facility]) - Progression Note    Patient Details  Name: Kyle Roberson MRN: [id number] Date of Birth: 1940/05/07  Transition of Care [facility]) CM/SW Contact  Darolyn Rua, [place] Phone Number: 12/24/2022, 10:18 AM  Clinical Narrative:     Update 11:04 am: Wife [name] called back to request to see if [facility] will accept patient, CSW has reached out to inpatient rehab admissions. Insurance auth for [employer] on hold until spouse decides.   Update 10:53 am: CSW received call from [facility] they report they will decline patient at this time as they do not feel he is appropriate for their program. CSW updated spouse [name] who reports they will move forward with [facility]. Insurance auth started and [place] [facility] [place] admissions updated.   CSW called [facility] rehab, transferred x2, CSW lvm with inpatient rehab admissions to inquire if they are able to confirm they received referral yesterday and if they are able to accept patient. Pending call back.   Patient's spouse [name] updated, she reports she wants to wait to hear back from [facility] before looking at [facility] vs [facility].   TOC supervisor aware.   Expected Discharge Plan: Home/Self Care Barriers to Discharge: No Barriers Identified  Expected Discharge Plan and Services       Living arrangements for the past 2 months: Single Family Home                                       Social Determinants of Health (SDOH) Interventions SDOH Screenings   Food Insecurity: No Food Insecurity (12/01/2022)  Housing: Low Risk  (12/01/2022)  Transportation Needs: No Transportation Needs (12/01/2022)  Utilities: Not At Risk (12/01/2022)  Depression (PHQ2-9): Low Risk  (07/27/2022)  Tobacco Use: Medium Risk (11/26/2022)    Readmission Risk Interventions     No data to display

## 2022-12-25 DIAGNOSIS — E669 Obesity, unspecified: Secondary | ICD-10-CM | POA: Diagnosis not present

## 2022-12-25 DIAGNOSIS — E1122 Type 2 diabetes mellitus with diabetic chronic kidney disease: Secondary | ICD-10-CM | POA: Diagnosis not present

## 2022-12-25 DIAGNOSIS — E8770 Fluid overload, unspecified: Secondary | ICD-10-CM | POA: Diagnosis not present

## 2022-12-25 DIAGNOSIS — I5033 Acute on chronic diastolic (congestive) heart failure: Secondary | ICD-10-CM | POA: Diagnosis not present

## 2022-12-25 LAB — GLUCOSE, CAPILLARY
Glucose-Capillary: 110 mg/dL — ABNORMAL HIGH (ref 70–99)
Glucose-Capillary: 221 mg/dL — ABNORMAL HIGH (ref 70–99)
Glucose-Capillary: 168 mg/dL — ABNORMAL HIGH (ref 70–99)
Glucose-Capillary: 324 mg/dL — ABNORMAL HIGH (ref 70–99)

## 2022-12-25 LAB — RENAL FUNCTION PANEL
Albumin: 3.3 g/dL — ABNORMAL LOW (ref 3.5–5.0)
Anion gap: 14 (ref 5–15)
BUN: 59 mg/dL — ABNORMAL HIGH (ref 8–23)
CO2: 25 mmol/L (ref 22–32)
Calcium: 8.1 mg/dL — ABNORMAL LOW (ref 8.9–10.3)
Chloride: 98 mmol/L (ref 98–111)
Creatinine, Ser: 3.19 mg/dL — ABNORMAL HIGH (ref 0.61–1.24)
GFR, Estimated: 19 mL/min — ABNORMAL LOW (ref >60)
Glucose, Bld: 136 mg/dL — ABNORMAL HIGH (ref 70–99)
Phosphorus: 4.1 mg/dL (ref 2.5–4.6)
Potassium: 3.9 mmol/L (ref 3.5–5.1)
Sodium: 137 mmol/L (ref 135–145)

## 2022-12-25 NOTE — Progress Notes (Signed)
Inpatient Rehab Admissions Coordinator:   Awaiting determination from [employer] regarding CIR prior auth request.  I will not have a bed for this patient to admit today.   Estill Dooms, PT, DPT Admissions Coordinator [phone] 12/25/22  9:27 AM

## 2022-12-25 NOTE — Progress Notes (Signed)
Progress Note   Patient: Kyle Roberson WUJ:[id number] DOB: 03-01-1941 DOA: 11/26/2022     29 DOS: the patient was seen and examined on 12/25/2022   Brief hospital course: 81yo with h/o CAD, stage 5 CKD, chronic diastolic CHF, BLE lymphedema, afib on Eliquis, PAH, and SDH presented on 09/05 with weight gain and SOB.     Hospital course / major events:  He was diagnosed with acute on chronic CHF and started on Lasix drip.  Cardiology was consulted. Echo this admission with EF 55-60%, mild LVH, moderate RV enlargement with mildly decreased RV systolic function, PASP 73 mmHg, severe LAE, mild-moderate TR, IVC dilated.  IV midodrine drip 09/09.   RHC done on 09/11 with elevated R > L filling pressures, mostly right-sided, worsening renal function and decreased UOP so Lasix stopped.  Started on CRRT 09/12 for additional volume removal and completed this. 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  He is on IV Lasix.  Remains on heparin gtt for Afib, has been bradycardic w/ ventricular rates 40-50s but go into 60-70 range on standing. Resumed Eliquis 09/24 Permcath placement 12/14/22 and now pending outpatient placement and HD plan    Consultants:  Cardiology - advanced HF team  Nephrology  PCCM Palliative care Surgery PT/OT [facility] team   Procedures: Echocardiogram 9/6 CVC 9/12 RHC on 9/11 CRRT HD 9/17, 9/18, 09/20, 09/24 Permcath placed 09/23   Assessment and Plan: Acute on chronic diastolic congestive heart failure Pulmonary HTN, severe - group 2 PH (from elevated LVEDP  Presented with volume overload with increasing weight up to 255 pounds.  Dry weight around ~220 pounds Monitor input output and creatinine fluid management w/ dialysis and oral furosemide 80mg  daily on non-HD days  Overall poor prognosis given advanced renal disease. Cardiology on board, continue HD for fluid management.   Acute hypoxic respiratory  failure d/t volume overload / CHF/COPD Supplemental O2 to wean as able.  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 He is on TTS schedule. Dialysis following catheter placement 09/23 which became nonfunctional on 9/24 Overall very poor prognosis 9/25 permanent HD catheter was placed by vascular surgery. Continue HD per nephrology team.   COPD exacerbation Patient does not have formal diagnosis but patient is having wheezing for past few days, initially he refused nebulizer treatment due to tachycardia in the past On 9/28 discussed again regarding nebulizer treatment, patient and his wife agreed Lianne Moris and budesonide nebulizer treatment twice daily 9/29 started Solu-Medrol 40 mg IV twice daily for 2 doses followed by prednisone 40 mg p.o. daily for 5 days. DuoNeb every 6 hourly scheduled, transition to as needed after improvement 10/1 start Breo Ellipta tomorrow a.m. and stop Brovana and budesonide nebulizer after tonight dose   CAD with remote PCI Continue statin, No ASA 9/30 resumed Imdur 60 mg p.o. daily, patient had an episode of chest pain at night on 9/29 which resolved after 2 doses of nitroglycerin. Cardiology following.   Atrial fibrillation: Permanent. Bradycardic currently Apixaban was held and patient was on heparin which was discontinued and apixaban 2.5 mg p.o. twice daily was resumed.   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     Hyperphosphatemia Binders per nephro   Hypertension Continue doxazosin, hydralazine   Hyperlipidemia Continue  rosuvastatin, Lovaza   Bilateral lower extremity lymphedema, chronic Chronic skin changes, dermatitis and possible fungal infection 9/25 Started Lotrisone cream twice daily   GERD started pantoprazole 40 mg IV twice daily followed by 40 mg p.o. daily, started Tums as well    Obesity Complicates overall prognosis Body mass index is 35.73 kg/m.  Diet, exercise and weight reduction advised. Fluid management per HD.      Out of bed to chair. Incentive spirometry. Nursing supportive care. Fall, aspiration precautions. DVT prophylaxis: Eliquis   Code Status: Full Code  Subjective: Patient is seen and examined today during HD.  He is lying comfortably. No overnight issues. Eating fair. Working with PT.  Physical Exam: Vitals:   12/25/22 0257 12/25/22 0806 12/25/22 1203 12/25/22 1612  BP: (!) 135/58 (!) 144/62 (!) 143/65 125/69  Pulse: (!) 52 (!) 52 67 64  Resp: 18 16 16 16   Temp: 97.7 F (36.5 C) 98.2 F (36.8 C) 97.8 F (36.6 C) 97.7 F (36.5 C)  TempSrc:  Oral Oral Oral  SpO2: 98% 100% 100% 100%  Weight:      Height:        General - Elderly obese  Caucasian male, no apparent distress HEENT - PERRLA, EOMI, atraumatic head, non tender sinuses. Lung - Clear, bibasal rales, rhonchi, no wheezes. Heart - S1, S2 heard, no murmurs, rubs, 2+  pedal edema. Abdomen - Soft, non tender, non distended, bowel sounds good Neuro - Alert, awake and oriented x 3, non focal exam. Skin - Warm and dry. Chronic lower extremity skin changes, lymphedema  Data Reviewed:      Latest Ref Rng & Units 12/24/2022    9:50 AM 12/22/2022    9:06 AM 12/20/2022    9:44 PM  CBC  WBC 4.0 - 10.5 K/uL 8.3  7.9  6.4   Hemoglobin 13.0 - 17.0 g/dL 9.0  9.2  16.1   Hematocrit 39.0 - 52.0 % 28.8  28.7  32.0   Platelets 150 - 400 K/uL 191  174  163       Latest Ref Rng & Units 12/25/2022    6:45 AM 12/24/2022    9:50 AM 12/23/2022    5:40 AM  BMP  Glucose 70 - 99 mg/dL 096  045  409   BUN 8 - 23  mg/dL 59  79  47   Creatinine 0.61 - 1.24 mg/dL 8.11  9.14  7.82   Sodium 135 - 145 mmol/L 137  136  133   Potassium 3.5 - 5.1 mmol/L 3.9  3.6  3.9   Chloride 98 - 111 mmol/L 98  97  96   CO2 22 - 32 mmol/L 25  25  27    Calcium 8.9 - 10.3 mg/dL 8.1  7.9  8.1    No results found.   Family Communication: Discussed with patient, wife at bedside, they understand and agree. All questions answereed.    Disposition: Status is: Inpatient Remains inpatient appropriate because: HD, Inpatient rehab placement  Planned Discharge Destination: Rehab     Time spent: 37 minutes  Author: Marcelino Duster, MD 12/25/2022 6:03 PM Secure chat 7am to 7pm For on call review [email or web address].

## 2022-12-25 NOTE — Consult Note (Signed)
Physical Medicine and Rehabilitation Consult Reason for Consult: Cardiac debility Referring Physician: Marcelino Duster, MD   HPI: Kyle Roberson is a 82 y.o. male with type 2 diabetes, HTN, CAD, chronic diastolic congestive heart failure, atrial fibrillation, pulmonary hypertension, and a history of subdural hematoma, who was admitted on 11/26/22 with acute on chronic diastolic CHF. Physical Medicine & Rehabilitation was consulted to assess candidacy for CIR.     ROS lower extremity edema improved Past Medical History:  Diagnosis Date   AKI (acute kidney injury) (HCC) 07/06/2018   CAD (coronary artery disease)    a. Remote PCI/stenting to LAD w PTCA Diagnoal. RCA 60%; b.  2005/2008 Cardiolites w/ reportedly mild ischemia in Diag territory-->Med rx.   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 04/2019 Echo: EF 55-60%, no rwma, nl RV fxn, RVSP 55.4mmHg. Mod dil LA. Mild-mod MR. Mod dil PA.   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 04/2019 Echo: RVSP 55.46mmHg.   Permanent atrial fibrillation (HCC)    a. CHA2DS2VASc = 5-->dose adjusted eliquis (age/creat).   Subdural hematoma (HCC)    a. 01/2021 in setting of fall s/p L frontotemporal craniotomy.   Past Surgical History:  Procedure Laterality Date   BLEPHAROPLASTY     CARDIAC CATHETERIZATION     CORONARY STENT INTERVENTION     CRANIOTOMY Left 02/04/2021   Procedure: CRANIOTOMY FOR LEFT SUBDURAL  HEMATOMA EVACUATION;  Surgeon: Lucy Chris, MD;  Location: [facility] ORS;  Service: Neurosurgery;  Laterality: Left;   CYSTOSCOPY     DIALYSIS/PERMA CATHETER INSERTION N/A 12/14/2022   Procedure: DIALYSIS/PERMA CATHETER INSERTION;  Surgeon: Annice Needy, MD;  Location: [facility] INVASIVE CV LAB;  Service: Cardiovascular;  Laterality: N/A;   DIALYSIS/PERMA CATHETER REPAIR N/A 12/16/2022   Procedure: DIALYSIS/PERMA CATHETER REPAIR;  Surgeon: Annice Needy, MD;  Location: [facility] INVASIVE CV LAB;  Service: Cardiovascular;  Laterality: N/A;   HERNIA REPAIR     RIGHT HEART CATH N/A 12/02/2022   Procedure: RIGHT HEART CATH;  Surgeon: Laurey Morale, MD;  Location: [facility] INVASIVE CV LAB;  Service: Cardiovascular;  Laterality: N/A;   Family History  Problem Relation Age of Onset   Pancreatic cancer Mother    CAD Father    Diabetes Brother    Social History:  reports that he has quit smoking. He has never used smokeless tobacco. He reports that he does not currently use alcohol after a past usage of about 2.0 standard drinks of alcohol per week. He reports that he does not currently use drugs. Allergies:  Allergies  Allergen Reactions   Atorvastatin Hives, Itching and Other (See Comments)    Other reaction(s): Other (See Comments)   Simvastatin Hives, Itching and Other (See Comments)    Other reaction(s): Other (See Comments)   Medications Prior to Admission  Medication Sig Dispense Refill   apixaban (ELIQUIS) 2.5 MG TABS tablet Take 1 tablet (2.5 mg total) by mouth 2 (two) times daily. 180 tablet 1   Ascorbic Acid (VITAMIN C PO) Take 1 tablet by mouth in the morning.     Cholecalciferol (VITAMIN D3) 1.25 MG (50000 UT) CAPS Take 1 tablet by mouth in the  morning.     Coenzyme Q10-Vitamin E (QUNOL ULTRA COQ10 PO) Take 1 capsule by mouth in the morning.     doxazosin (CARDURA) 1 MG tablet TAKE 1 TABLET(1 MG) BY MOUTH TWICE DAILY 60 tablet 1   Flaxseed, Linseed, (FLAXSEED OIL PO) Take 1 capsule by mouth in the morning.     Furosemide (FUROSCIX) 80 MG/10ML CTKT Inject 80 mg into the skin as directed. 5 each 0   furosemide (LASIX) 40 MG tablet Take 1 tablet daily every other day, alternating with 2 tabs daily every other day 45 tablet 3   glipiZIDE  (GLUCOTROL XL) 5 MG 24 hr tablet Take 1 tablet (5 mg total) by mouth daily with breakfast. (Patient taking differently: Take 10 mg by mouth daily with breakfast.) 90 tablet 1   hydrALAZINE (APRESOLINE) 100 MG tablet TAKE 1 TABLET BY MOUTH THREE TIMES DAILY, WITH EXTRA 100 MG AS NEEDED FOR HIGH PRESSURE 90 tablet 1   isosorbide mononitrate (IMDUR) 30 MG 24 hr tablet Take 1 tablet (30 mg total) by mouth in the morning, at noon, and at bedtime. 90 tablet 6   Multiple Vitamin (MULTIVITAMIN WITH MINERALS) TABS tablet Take 1 tablet by mouth in the morning.     neomycin-polymyxin b-dexamethasone (MAXITROL) 3.5-10000-0.1 OINT Place 1 Application into the right eye 3 (three) times daily.     nitroGLYCERIN (NITROLINGUAL) 0.4 MG/SPRAY spray Place 1 spray under the tongue as directed. 12 g 0   Omega-3 Fatty Acids (FISH OIL PO) Take 1 capsule by mouth in the morning.     rosuvastatin (CRESTOR) 10 MG tablet Take 1 tablet (10 mg total) by mouth 3 (three) times a week. 45 tablet 3   Turmeric (QC TUMERIC COMPLEX PO) Take 1 capsule by mouth in the morning. Qunol Turmeric Curcumin- 40 MG      Home: Home Living Family/patient expects to be discharged to:: Private residence Living Arrangements: Spouse/significant other Available Help at Discharge: Family, Available PRN/intermittently (wife works outside the home) Type of Home: House Home Access: Ramped entrance Home Layout: One level Bathroom Shower/Tub: Psychologist, counselling, Nurse, adult Toilet: Handicapped height Bathroom Accessibility: Yes Home Equipment: Medical laboratory scientific officer - quad, Information systems manager, Grab bars - toilet, Grab bars - tub/shower, Hand held shower head, Wheelchair - manual, Rollator (4 wheels) Additional Comments: lift chair  Functional History: Prior Function Prior Level of Function : Needs assist Mobility Comments: per wife AMB withquad cane or nothing for household mobility; at baseline does not go out of house for mobility unless needed ADLs Comments:  Wife assists with ADL (min-modA) Functional Status:  Mobility: Bed Mobility Overal bed mobility: Needs Assistance Bed Mobility: Sit to Supine Rolling: +2 for physical assistance Sidelying to sit: Mod assist, +2 for physical assistance Supine to sit: Min assist Sit to supine: Mod assist General bed mobility comments: assist with BLE and trunk management but steadily improving with initiation of task Transfers Overall transfer level: Needs assistance Equipment used: Rolling walker (2 wheels) Transfers: Bed to chair/wheelchair/BSC Sit to Stand: Mod assist, +2 physical assistance Bed to/from chair/wheelchair/BSC transfer type:: Step pivot Step pivot transfers: Mod assist, +2 physical assistance, Max assist  Lateral/Scoot Transfers: Max assist, +2 physical assistance General transfer comment: able to perform 2x STS from recliner mod-max x1 for fatigued following second rep; SPT from recliner to bed mod A x2 for safety due to poor endurance Ambulation/Gait General Gait Details: Deferred at this time Pre-gait activities: marching in place performed with Min A +2 using rolling walker for 5  seconds x 2 bouts. emphasis on upright standing posture, hip extension and avoid flexed posture, increasing standing tolerance in preparation for standing. side steps performed to the right with Min A +2 person required for safety    ADL: ADL Overall ADL's : Needs assistance/impaired Grooming: Sitting, Wash/dry hands, Wash/dry face, Set up, Supervision/safety Lower Body Bathing: Sitting/lateral leans, Moderate assistance, Maximal assistance Toilet Transfer: +2 for physical assistance, BSC/3in1, Stand-pivot, Rolling walker (2 wheels) Toilet Transfer Details (indicate cue type and reason): anticipated Toileting- Clothing Manipulation and Hygiene: Maximal assistance, Sit to/from stand Functional mobility during ADLs: Rolling walker (2 wheels), Minimal assistance, Moderate assistance General ADL Comments:  SETUP for UB washing, MAX A for LB bathing in sitting.  Cognition: Cognition Overall Cognitive Status: Within Functional Limits for tasks assessed Orientation Level: Oriented X4 Cognition Arousal: Alert Behavior During Therapy: WFL for tasks assessed/performed Overall Cognitive Status: Within Functional Limits for tasks assessed General Comments: AO x4; pleasant and cooperative for therapy  Blood pressure (!) 143/65, pulse 67, temperature 97.8 F (36.6 C), temperature source Oral, resp. rate 16, height 5\' 8"  (1.727 m), weight 93.3 kg, SpO2 100%. Physical Exam Gen: no distress, normal appearing HEENT: oral mucosa pink and moist, NCAT Cardio: Reg rate Chest: normal effort, normal rate of breathing Abd: soft, non-distended Ext: +lower extremity edema Psych: pleasant, normal affect Skin: intact Neuro: Alert and oriented x3 MSK: no focal deficits  Results for orders placed or performed during the hospital encounter of 11/26/22 (from the past 24 hour(s))  Glucose, capillary     Status: Abnormal   Collection Time: 12/24/22  5:01 PM  Result Value Ref Range   Glucose-Capillary 198 (H) 70 - 99 mg/dL  Glucose, capillary     Status: Abnormal   Collection Time: 12/24/22  9:27 PM  Result Value Ref Range   Glucose-Capillary 292 (H) 70 - 99 mg/dL  Renal function panel (daily at 0500)     Status: Abnormal   Collection Time: 12/25/22  6:45 AM  Result Value Ref Range   Sodium 137 135 - 145 mmol/L   Potassium 3.9 3.5 - 5.1 mmol/L   Chloride 98 98 - 111 mmol/L   CO2 25 22 - 32 mmol/L   Glucose, Bld 136 (H) 70 - 99 mg/dL   BUN 59 (H) 8 - 23 mg/dL   Creatinine, Ser 4.09 (H) 0.61 - 1.24 mg/dL   Calcium 8.1 (L) 8.9 - 10.3 mg/dL   Phosphorus 4.1 2.5 - 4.6 mg/dL   Albumin 3.3 (L) 3.5 - 5.0 g/dL   GFR, Estimated 19 (L) >60 mL/min   Anion gap 14 5 - 15  Glucose, capillary     Status: Abnormal   Collection Time: 12/25/22  8:07 AM  Result Value Ref Range   Glucose-Capillary 110 (H) 70 - 99  mg/dL  Glucose, capillary     Status: Abnormal   Collection Time: 12/25/22 12:04 PM  Result Value Ref Range   Glucose-Capillary 221 (H) 70 - 99 mg/dL   No results found.  Assessment/Plan: Diagnosis: Cardiac debility Does the need for close, 24 hr/day medical supervision in concert with the patient's rehab needs make it unreasonable for this patient to be served in a less intensive setting? Yes Co-Morbidities requiring supervision/potential complications:  1) Type 2 diabetes: holding empagliflozin 2) CHF: continue furosemide 3) Anemia: continue EPO 4) Secondary hyperparathyroidism 5) ESRD Due to bladder management, bowel management, safety, skin/wound care, disease management, medication administration, pain management, and patient education, does the patient require 24 hr/day  rehab nursing? Yes Does the patient require coordinated care of a physician, rehab nurse, therapy disciplines of PT, OT to address physical and functional deficits in the context of the above medical diagnosis(es)? Yes Addressing deficits in the following areas: balance, endurance, locomotion, strength, transferring, bowel/bladder control, bathing, dressing, feeding, grooming, toileting, and psychosocial support Can the patient actively participate in an intensive therapy program of at least 3 hrs of therapy per day at least 5 days per week? Yes The potential for patient to make measurable gains while on inpatient rehab is excellent Anticipated functional outcomes upon discharge from inpatient rehab are supervision  with PT, supervision with OT, supervision with SLP. Estimated rehab length of stay to reach the above functional goals is: 14-16 days Anticipated discharge destination: Home Overall Rehab/Functional Prognosis: excellent  POST ACUTE RECOMMENDATIONS: This patient's condition is appropriate for continued rehabilitative care in the following setting: CIR Patient has agreed to participate in recommended  program. Yes Note that insurance prior authorization may be required for reimbursement for recommended care.     I have personally performed a face to face diagnostic evaluation of this patient. Additionally, I have examined the patient's medical record including any pertinent labs and radiographic images. If the physician assistant has documented in this note, I have reviewed and edited or otherwise concur with the physician assistant's documentation.  Thanks,  Horton Chin, MD 12/25/2022

## 2022-12-25 NOTE — Discharge Planning (Signed)
Continuing to follow for OPHD placement. Patient is currently set up at [facility] 6:45am. If patient discharges to a SNF outside of [facility], the clinic will need to be switched to what is closest to [facility]. If patient discharges to an inpatient rehab, [facility] will be notified that patient will start with them at a later date.   Dimas Chyle Dialysis Coordinator II  [facility] Cell: [phone] eFax: [phone] [email or web address]

## 2022-12-25 NOTE — Plan of Care (Signed)
  Problem: Education: Goal: Ability to describe self-care measures that may prevent or decrease complications (Diabetes Survival Skills Education) will improve Outcome: Progressing Goal: Individualized Educational Video(s) Outcome: Progressing   Problem: Coping: Goal: Ability to adjust to condition or change in health will improve Outcome: Progressing   Problem: Fluid Volume: Goal: Ability to maintain a balanced intake and output will improve Outcome: Progressing   Problem: Health Behavior/Discharge Planning: Goal: Ability to identify and utilize available resources and services will improve Outcome: Progressing Goal: Ability to manage health-related needs will improve Outcome: Progressing   Problem: Metabolic: Goal: Ability to maintain appropriate glucose levels will improve Outcome: Progressing   Problem: Nutritional: Goal: Maintenance of adequate nutrition will improve Outcome: Progressing Goal: Progress toward achieving an optimal weight will improve Outcome: Progressing   Problem: Skin Integrity: Goal: Risk for impaired skin integrity will decrease Outcome: Progressing   Problem: Tissue Perfusion: Goal: Adequacy of tissue perfusion will improve Outcome: Progressing   Problem: Education: Goal: Knowledge of General Education information will improve Description: Including pain rating scale, medication(s)/side effects and non-pharmacologic comfort measures Outcome: Progressing   Problem: Health Behavior/Discharge Planning: Goal: Ability to manage health-related needs will improve Outcome: Progressing   Problem: Clinical Measurements: Goal: Ability to maintain clinical measurements within normal limits will improve Outcome: Progressing Goal: Will remain free from infection Outcome: Progressing Goal: Diagnostic test results will improve Outcome: Progressing Goal: Respiratory complications will improve Outcome: Progressing Goal: Cardiovascular complication will  be avoided Outcome: Progressing   Problem: Activity: Goal: Risk for activity intolerance will decrease Outcome: Progressing   Problem: Nutrition: Goal: Adequate nutrition will be maintained Outcome: Progressing   Problem: Coping: Goal: Level of anxiety will decrease Outcome: Progressing   Problem: Elimination: Goal: Will not experience complications related to bowel motility Outcome: Progressing Goal: Will not experience complications related to urinary retention Outcome: Progressing   Problem: Pain Managment: Goal: General experience of comfort will improve Outcome: Progressing   Problem: Safety: Goal: Ability to remain free from injury will improve Outcome: Progressing   Problem: Skin Integrity: Goal: Risk for impaired skin integrity will decrease Outcome: Progressing   Problem: Education: Goal: Understanding of CV disease, CV risk reduction, and recovery process will improve Outcome: Progressing Goal: Individualized Educational Video(s) Outcome: Progressing   Problem: Activity: Goal: Ability to return to baseline activity level will improve Outcome: Progressing   Problem: Cardiovascular: Goal: Ability to achieve and maintain adequate cardiovascular perfusion will improve Outcome: Progressing Goal: Vascular access site(s) Level 0-1 will be maintained Outcome: Progressing   Problem: Health Behavior/Discharge Planning: Goal: Ability to safely manage health-related needs after discharge will improve Outcome: Progressing   Problem: Education: Goal: Knowledge of disease and its progression will improve Outcome: Progressing   Problem: Fluid Volume: Goal: Compliance with measures to maintain balanced fluid volume will improve Outcome: Progressing   Problem: Health Behavior/Discharge Planning: Goal: Ability to manage health-related needs will improve Outcome: Progressing   Problem: Nutritional: Goal: Ability to make healthy dietary choices will  improve Outcome: Progressing   Problem: Clinical Measurements: Goal: Complications related to the disease process, condition or treatment will be avoided or minimized Outcome: Progressing

## 2022-12-25 NOTE — TOC Progression Note (Signed)
Transition of Care [facility]) - Progression Note    Patient Details  Name: Kyle Roberson MRN: [id number] Date of Birth: 1941/03/02  Transition of Care [facility]) CM/SW Contact  Liliana Cline, LCSW Phone Number: 12/25/2022, 9:58 AM  Clinical Narrative:    Checked with CIR Representative Luther Parody, plan for patient to DC to CIR pending auth an bed availability. Per Luther Parody, they will not have a bed until early next week.    Expected Discharge Plan: Home/Self Care Barriers to Discharge: No Barriers Identified  Expected Discharge Plan and Services       Living arrangements for the past 2 months: Single Family Home                                       Social Determinants of Health (SDOH) Interventions SDOH Screenings   Food Insecurity: No Food Insecurity (12/01/2022)  Housing: Low Risk  (12/01/2022)  Transportation Needs: No Transportation Needs (12/01/2022)  Utilities: Not At Risk (12/01/2022)  Depression (PHQ2-9): Low Risk  (07/27/2022)  Tobacco Use: Medium Risk (11/26/2022)    Readmission Risk Interventions     No data to display

## 2022-12-25 NOTE — Progress Notes (Signed)
[facility]  ROUNDING NOTE   Subjective:   Patient seen sitting up in bed Alert No family at bedside Room air  Lower extremity edema greatly improved.    Objective:  Vital signs in last 24 hours:  Temp:  [97.3 F (36.3 C)-98.2 F (36.8 C)] 98.2 F (36.8 C) (10/04 0806) Pulse Rate:  [52-71] 52 (10/04 0806) Resp:  [16-26] 16 (10/04 0806) BP: (98-144)/(47-62) 144/62 (10/04 0806) SpO2:  [96 %-100 %] 100 % (10/04 0806) Weight:  [93.3 kg] 93.3 kg (10/03 1401)  Weight change:  Filed Weights   12/18/22 0500 12/24/22 1401  Weight: 106.6 kg 93.3 kg    Intake/Output: I/O last 3 completed shifts: In: 300 [P.O.:300] Out: 3150 [Urine:150; Other:3000]   Intake/Output this shift:  Total I/O In: 240 [P.O.:240] Out: -   Physical Exam: General: NAD  Head: Normocephalic, atraumatic. Moist oral mucosal membranes  Eyes: Anicteric, ectropion  Lungs:  Diminished, room air  Heart: S1S2 no rubs  Abdomen:  Soft, nontender  Extremities: Trace peripheral edema.  Neurologic: Awake, alert  Skin: No lesions  Access: Rt Chest permcath    Basic Metabolic Panel: Recent Labs  Lab 12/19/22 0723 12/19/22 0903 12/20/22 2144 12/21/22 0525 12/22/22 0554 12/22/22 0906 12/23/22 0540 12/24/22 0950 12/25/22 0645  NA 136   < > 136   < > 136 136 133* 136 137  K 3.8   < > 4.3   < > 4.6 4.7 3.9 3.6 3.9  CL 100   < > 99   < > 101 100 96* 97* 98  CO2 26   < > 25   < > 24 23 27 25 25   GLUCOSE 135*   < > 299*   < > 232* 246* 189* 190* 136*  BUN 49*   < > 43*   < > 71* 75* 47* 79* 59*  CREATININE 3.67*   < > 3.69*   < > 4.68* 4.91* 3.20* 4.22* 3.19*  CALCIUM 8.3*   < > 8.6*   < > 8.4* 8.5* 8.1* 7.9* 8.1*  MG 1.8  --  1.7  --   --  2.1  --   --   --   PHOS 5.5*   < >  --    < > 5.1* 5.5* 4.0 5.1* 4.1   < > = values in this interval not displayed.    Liver Function Tests: Recent Labs  Lab 12/21/22 0525 12/22/22 0554 12/23/22 0540 12/24/22 0950 12/25/22 0645  ALBUMIN 3.2*  3.0* 3.3* 3.0* 3.3*   No results for input(s): "LIPASE", "AMYLASE" in the last 168 hours. No results for input(s): "AMMONIA" in the last 168 hours.  CBC: Recent Labs  Lab 12/19/22 0723 12/19/22 0903 12/20/22 2144 12/22/22 0906 12/24/22 0950  WBC 7.9 8.0 6.4 7.9 8.3  NEUTROABS  --   --   --   --  6.8  HGB 10.1* 9.6* 10.1* 9.2* 9.0*  HCT 33.0* 31.4* 32.0* 28.7* 28.8*  MCV 96.5 97.5 95.8 92.0 95.0  PLT 141* 142* 163 174 191    Cardiac Enzymes: No results for input(s): "CKTOTAL", "CKMB", "CKMBINDEX", "TROPONINI" in the last 168 hours.  BNP: Invalid input(s): "POCBNP"  CBG: Recent Labs  Lab 12/24/22 0744 12/24/22 1603 12/24/22 1701 12/24/22 2127 12/25/22 0807  GLUCAP 148* 148* 198* 292* 110*    Microbiology: Results for orders placed or performed during the hospital encounter of 11/26/22  MRSA Next Gen by PCR, Nasal     Status: None  Collection Time: 11/30/22  4:10 PM   Specimen: Nasal Mucosa; Nasal Swab  Result Value Ref Range Status   MRSA by PCR Next Gen NOT DETECTED NOT DETECTED Final    Comment: (NOTE) The GeneXpert MRSA Assay (FDA approved for NASAL specimens only), is one component of a comprehensive MRSA colonization surveillance program. It is not intended to diagnose MRSA infection nor to guide or monitor treatment for MRSA infections. Test performance is not FDA approved in patients less than 13 years old. Performed at [facility], [address] [place], [place] [address]     Coagulation Studies: No results for input(s): "LABPROT", "INR" in the last 72 hours.    Urinalysis: No results for input(s): "COLORURINE", "LABSPEC", "PHURINE", "GLUCOSEU", "HGBUR", "BILIRUBINUR", "KETONESUR", "PROTEINUR", "UROBILINOGEN", "NITRITE", "LEUKOCYTESUR" in the last 72 hours.  Invalid input(s): "APPERANCEUR"    Imaging: No results found.   Medications:    sodium chloride      apixaban  2.5 mg Oral BID   vitamin C  500 mg Oral BID    Chlorhexidine Gluconate Cloth  6 each Topical Daily   cholecalciferol  2,000 Units Oral Daily   clotrimazole-betamethasone   Topical BID   docusate sodium  100 mg Oral BID   doxazosin  1 mg Oral BID   epoetin (EPOGEN/PROCRIT) injection  4,000 Units Intravenous Q T,Th,Sa-HD   fluticasone furoate-vilanterol  1 puff Inhalation Daily   furosemide  80 mg Oral Q M,W,F   hydrALAZINE  25 mg Oral TID   insulin aspart  0-15 Units Subcutaneous TID AC & HS   insulin aspart  3 Units Subcutaneous TID WC   insulin glargine-yfgn  20 Units Subcutaneous Daily   isosorbide mononitrate  60 mg Oral Daily   multivitamin  1 tablet Oral QHS   neomycin-polymyxin b-dexamethasone  1 Application Right Eye TID   omega-3 acid ethyl esters  1 g Oral Daily   pantoprazole  40 mg Oral Daily   predniSONE  40 mg Oral Q breakfast   rosuvastatin  10 mg Oral Once per day on Monday Wednesday Friday   sodium chloride flush  3 mL Intravenous Q12H   sodium chloride, acetaminophen **OR** acetaminophen, calcium carbonate, diphenhydrAMINE-zinc acetate, hydrOXYzine, ipratropium-albuterol, morphine injection, nitroGLYCERIN, ondansetron **OR** ondansetron (ZOFRAN) IV, oxyCODONE, polyethylene glycol, simethicone, sodium chloride flush, traZODone  Assessment/ Plan:  [name] is a 82 y.o.  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 Due to progression of kidney disease and outpatient diuretic therapy failure, we feel this patient has progressed to end stage renal disease.  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 CRRT from 9/12 to 9/13 Dialysis received yesterday,  UF 3L achieved. Post dialysis, standing weight, 93.3kg.  Next treatment scheduled for Saturday.  Outpatient clinic arranged at [facility].  Acute exacerbation of chronic diastolic congestive heart failure: complicated with acute respiratory failure and pulmonary hypertension. -Will continue to manage fluid with dialysis - oral Furosemide 80mg  on non-HD days  Diabetes mellitus type II with chronic kidney disease - holding empagliflozin - Oral steroids. - Glucose elevated at times  4.  Anemia of chronic kidney disease. Hgb 9.0.  Low dose EPO with dialysis treatments.  5.  Secondary hyperparathyroidism.  Continue to periodically monitor bone metabolism  parameters.  Calcium and phosphorus within desired range   LOS: 29 Kyle Roberson 10/4/202411:28 AM

## 2022-12-26 DIAGNOSIS — E8770 Fluid overload, unspecified: Secondary | ICD-10-CM | POA: Diagnosis not present

## 2022-12-26 DIAGNOSIS — I5033 Acute on chronic diastolic (congestive) heart failure: Secondary | ICD-10-CM | POA: Diagnosis not present

## 2022-12-26 DIAGNOSIS — E1122 Type 2 diabetes mellitus with diabetic chronic kidney disease: Secondary | ICD-10-CM | POA: Diagnosis not present

## 2022-12-26 DIAGNOSIS — E669 Obesity, unspecified: Secondary | ICD-10-CM | POA: Diagnosis not present

## 2022-12-26 LAB — CBC
HCT: 33.8 % — ABNORMAL LOW (ref 39.0–52.0)
Hemoglobin: 10.8 g/dL — ABNORMAL LOW (ref 13.0–17.0)
MCH: 29.8 pg (ref 26.0–34.0)
MCHC: 32 g/dL (ref 30.0–36.0)
MCV: 93.1 fL (ref 80.0–100.0)
Platelets: 253 10*3/uL (ref 150–400)
RBC: 3.63 MIL/uL — ABNORMAL LOW (ref 4.22–5.81)
RDW: 15.2 % (ref 11.5–15.5)
WBC: 10.8 10*3/uL — ABNORMAL HIGH (ref 4.0–10.5)
nRBC: 0 % (ref 0.0–0.2)

## 2022-12-26 LAB — BASIC METABOLIC PANEL
Anion gap: 11 (ref 5–15)
BUN: 36 mg/dL — ABNORMAL HIGH (ref 8–23)
CO2: 25 mmol/L (ref 22–32)
Calcium: 8.1 mg/dL — ABNORMAL LOW (ref 8.9–10.3)
Chloride: 97 mmol/L — ABNORMAL LOW (ref 98–111)
Creatinine, Ser: 2.48 mg/dL — ABNORMAL HIGH (ref 0.61–1.24)
GFR, Estimated: 25 mL/min — ABNORMAL LOW (ref >60)
Glucose, Bld: 262 mg/dL — ABNORMAL HIGH (ref 70–99)
Potassium: 3.6 mmol/L (ref 3.5–5.1)
Sodium: 133 mmol/L — ABNORMAL LOW (ref 135–145)

## 2022-12-26 LAB — RENAL FUNCTION PANEL
Albumin: 3.4 g/dL — ABNORMAL LOW (ref 3.5–5.0)
Anion gap: 10 (ref 5–15)
BUN: 40 mg/dL — ABNORMAL HIGH (ref 8–23)
CO2: 27 mmol/L (ref 22–32)
Calcium: 8 mg/dL — ABNORMAL LOW (ref 8.9–10.3)
Chloride: 97 mmol/L — ABNORMAL LOW (ref 98–111)
Creatinine, Ser: 2.38 mg/dL — ABNORMAL HIGH (ref 0.61–1.24)
GFR, Estimated: 26 mL/min — ABNORMAL LOW (ref >60)
Glucose, Bld: 182 mg/dL — ABNORMAL HIGH (ref 70–99)
Phosphorus: 2.3 mg/dL — ABNORMAL LOW (ref 2.5–4.6)
Potassium: 3.2 mmol/L — ABNORMAL LOW (ref 3.5–5.1)
Sodium: 134 mmol/L — ABNORMAL LOW (ref 135–145)

## 2022-12-26 LAB — GLUCOSE, CAPILLARY
Glucose-Capillary: 125 mg/dL — ABNORMAL HIGH (ref 70–99)
Glucose-Capillary: 138 mg/dL — ABNORMAL HIGH (ref 70–99)
Glucose-Capillary: 327 mg/dL — ABNORMAL HIGH (ref 70–99)
Glucose-Capillary: 237 mg/dL — ABNORMAL HIGH (ref 70–99)

## 2022-12-26 MED ORDER — EPOETIN ALFA 4000 UNIT/ML IJ SOLN
INTRAMUSCULAR | Status: AC
  Filled 2022-12-26: qty 1

## 2022-12-26 MED ORDER — HEPARIN SODIUM (PORCINE) 1000 UNIT/ML DIALYSIS: 1000.0000 [IU] | INTRAMUSCULAR | Status: DC | PRN

## 2022-12-26 MED ORDER — ALTEPLASE 2 MG IJ SOLR: 2.0000 mg | Freq: Once | INTRAMUSCULAR | Status: DC | PRN

## 2022-12-26 NOTE — Plan of Care (Signed)
  Problem: Education: Goal: Ability to describe self-care measures that may prevent or decrease complications (Diabetes Survival Skills Education) will improve Outcome: Progressing Goal: Individualized Educational Video(s) Outcome: Progressing   Problem: Coping: Goal: Ability to adjust to condition or change in health will improve Outcome: Progressing   Problem: Fluid Volume: Goal: Ability to maintain a balanced intake and output will improve Outcome: Progressing   Problem: Health Behavior/Discharge Planning: Goal: Ability to identify and utilize available resources and services will improve Outcome: Progressing Goal: Ability to manage health-related needs will improve Outcome: Progressing   Problem: Metabolic: Goal: Ability to maintain appropriate glucose levels will improve Outcome: Progressing   Problem: Nutritional: Goal: Maintenance of adequate nutrition will improve Outcome: Progressing Goal: Progress toward achieving an optimal weight will improve Outcome: Progressing   Problem: Skin Integrity: Goal: Risk for impaired skin integrity will decrease Outcome: Progressing   Problem: Tissue Perfusion: Goal: Adequacy of tissue perfusion will improve Outcome: Progressing   Problem: Education: Goal: Knowledge of General Education information will improve Description: Including pain rating scale, medication(s)/side effects and non-pharmacologic comfort measures Outcome: Progressing   Problem: Health Behavior/Discharge Planning: Goal: Ability to manage health-related needs will improve Outcome: Progressing   Problem: Clinical Measurements: Goal: Ability to maintain clinical measurements within normal limits will improve Outcome: Progressing Goal: Will remain free from infection Outcome: Progressing Goal: Diagnostic test results will improve Outcome: Progressing Goal: Respiratory complications will improve Outcome: Progressing Goal: Cardiovascular complication will  be avoided Outcome: Progressing   Problem: Activity: Goal: Risk for activity intolerance will decrease Outcome: Progressing   Problem: Nutrition: Goal: Adequate nutrition will be maintained Outcome: Progressing   Problem: Coping: Goal: Level of anxiety will decrease Outcome: Progressing   Problem: Elimination: Goal: Will not experience complications related to bowel motility Outcome: Progressing Goal: Will not experience complications related to urinary retention Outcome: Progressing   Problem: Pain Managment: Goal: General experience of comfort will improve Outcome: Progressing   Problem: Safety: Goal: Ability to remain free from injury will improve Outcome: Progressing   Problem: Skin Integrity: Goal: Risk for impaired skin integrity will decrease Outcome: Progressing   Problem: Education: Goal: Understanding of CV disease, CV risk reduction, and recovery process will improve Outcome: Progressing Goal: Individualized Educational Video(s) Outcome: Progressing   Problem: Activity: Goal: Ability to return to baseline activity level will improve Outcome: Progressing   Problem: Cardiovascular: Goal: Ability to achieve and maintain adequate cardiovascular perfusion will improve Outcome: Progressing Goal: Vascular access site(s) Level 0-1 will be maintained Outcome: Progressing   Problem: Health Behavior/Discharge Planning: Goal: Ability to safely manage health-related needs after discharge will improve Outcome: Progressing   Problem: Education: Goal: Knowledge of disease and its progression will improve Outcome: Progressing   Problem: Fluid Volume: Goal: Compliance with measures to maintain balanced fluid volume will improve Outcome: Progressing   Problem: Health Behavior/Discharge Planning: Goal: Ability to manage health-related needs will improve Outcome: Progressing   Problem: Nutritional: Goal: Ability to make healthy dietary choices will  improve Outcome: Progressing   Problem: Clinical Measurements: Goal: Complications related to the disease process, condition or treatment will be avoided or minimized Outcome: Progressing

## 2022-12-26 NOTE — Progress Notes (Addendum)
Received patient in bed to unit.  Alert and oriented.  Informed consent signed and in chart.   TX duration:3.5  Patient tolerated well.  Transported back to the room  Alert, without acute distress.  Hand-off given to patient's nurse.   Access used: right HD cath Access issues: none Total UF removed: 3L Medication(s) given: epgen   12/26/22 1207  Vitals  Temp (!) 97.1 F (36.2 C)  Temp Source Oral  BP (!) 122/58  BP Location Right Arm  BP Method Automatic  Patient Position (if appropriate) Lying  Pulse Rate 63  Pulse Rate Source Monitor  Resp (!) 23  Oxygen Therapy  SpO2 97 %  O2 Device Room Air  Patient Activity (if Appropriate) In chair  Pulse Oximetry Type Continuous  During Treatment Monitoring  Dialysate Potassium Concentration 2  Dialysate Calcium Concentration 2.5  HD Safety Checks Performed Yes  Intra-Hemodialysis Comments Tx completed;Tolerated well  Dialysis Fluid Bolus Normal Saline  Bolus Amount (mL) 300 mL      [facility]

## 2022-12-26 NOTE — Progress Notes (Signed)
[facility]  ROUNDING NOTE   Subjective:   Patient seen and evaluated during dialysis   HEMODIALYSIS FLOWSHEET:  Blood Flow Rate (mL/min): 399 mL/min Arterial Pressure (mmHg): -197.77 mmHg Venous Pressure (mmHg): 159.79 mmHg TMP (mmHg): 18.38 mmHg Ultrafiltration Rate (mL/min): 1050 mL/min Dialysate Flow Rate (mL/min): 300 ml/min  Tolerating treatment seated in chair No complaints to offer.   Objective:  Vital signs in last 24 hours:  Temp:  [97.1 F (36.2 C)-97.9 F (36.6 C)] 97.1 F (36.2 C) (10/05 1207) Pulse Rate:  [54-69] 63 (10/05 1207) Resp:  [16-25] 23 (10/05 1207) BP: (106-165)/(50-73) 122/58 (10/05 1207) SpO2:  [97 %-100 %] 97 % (10/05 1207)  Weight change:  Filed Weights   12/18/22 0500 12/24/22 1401  Weight: 106.6 kg 93.3 kg    Intake/Output: I/O last 3 completed shifts: In: 720 [P.O.:720] Out: 550 [Urine:550]   Intake/Output this shift:  No intake/output data recorded.  Physical Exam: General: NAD  Head: Normocephalic, atraumatic. Moist oral mucosal membranes  Eyes: Anicteric, ectropion  Lungs:  Diminished, room air  Heart: S1S2 no rubs  Abdomen:  Soft, nontender  Extremities: 1+ peripheral edema.  Neurologic: Awake, alert  Skin: No lesions  Access: Rt Chest permcath    Basic Metabolic Panel: Recent Labs  Lab 12/20/22 2144 12/21/22 0525 12/22/22 0906 12/23/22 0540 12/24/22 0950 12/25/22 0645 12/26/22 1021  NA 136   < > 136 133* 136 137 134*  K 4.3   < > 4.7 3.9 3.6 3.9 3.2*  CL 99   < > 100 96* 97* 98 97*  CO2 25   < > 23 27 25 25 27   GLUCOSE 299*   < > 246* 189* 190* 136* 182*  BUN 43*   < > 75* 47* 79* 59* 40*  CREATININE 3.69*   < > 4.91* 3.20* 4.22* 3.19* 2.38*  CALCIUM 8.6*   < > 8.5* 8.1* 7.9* 8.1* 8.0*  MG 1.7  --  2.1  --   --   --   --   PHOS  --    < > 5.5* 4.0 5.1* 4.1 2.3*   < > = values in this interval not displayed.    Liver Function Tests: Recent Labs  Lab 12/22/22 0554 12/23/22 0540  12/24/22 0950 12/25/22 0645 12/26/22 1021  ALBUMIN 3.0* 3.3* 3.0* 3.3* 3.4*   No results for input(s): "LIPASE", "AMYLASE" in the last 168 hours. No results for input(s): "AMMONIA" in the last 168 hours.  CBC: Recent Labs  Lab 12/20/22 2144 12/22/22 0906 12/24/22 0950 12/26/22 1021  WBC 6.4 7.9 8.3 10.8*  NEUTROABS  --   --  6.8  --   HGB 10.1* 9.2* 9.0* 10.8*  HCT 32.0* 28.7* 28.8* 33.8*  MCV 95.8 92.0 95.0 93.1  PLT 163 174 191 253    Cardiac Enzymes: No results for input(s): "CKTOTAL", "CKMB", "CKMBINDEX", "TROPONINI" in the last 168 hours.  BNP: Invalid input(s): "POCBNP"  CBG: Recent Labs  Lab 12/25/22 0807 12/25/22 1204 12/25/22 1614 12/25/22 2034 12/26/22 0800  GLUCAP 110* 221* 168* 324* 125*    Microbiology: Results for orders placed or performed during the hospital encounter of 11/26/22  MRSA Next Gen by PCR, Nasal     Status: None   Collection Time: 11/30/22  4:10 PM   Specimen: Nasal Mucosa; Nasal Swab  Result Value Ref Range Status   MRSA by PCR Next Gen NOT DETECTED NOT DETECTED Final    Comment: (NOTE) The GeneXpert MRSA Assay (FDA approved  for NASAL specimens only), is one component of a comprehensive MRSA colonization surveillance program. It is not intended to diagnose MRSA infection nor to guide or monitor treatment for MRSA infections. Test performance is not FDA approved in patients less than 4 years old. Performed at [facility], [address] [place], [place] [address]     Coagulation Studies: No results for input(s): "LABPROT", "INR" in the last 72 hours.    Urinalysis: No results for input(s): "COLORURINE", "LABSPEC", "PHURINE", "GLUCOSEU", "HGBUR", "BILIRUBINUR", "KETONESUR", "PROTEINUR", "UROBILINOGEN", "NITRITE", "LEUKOCYTESUR" in the last 72 hours.  Invalid input(s): "APPERANCEUR"    Imaging: No results found.   Medications:    sodium chloride      apixaban  2.5 mg Oral BID   vitamin C  500 mg  Oral BID   Chlorhexidine Gluconate Cloth  6 each Topical Daily   cholecalciferol  2,000 Units Oral Daily   clotrimazole-betamethasone   Topical BID   docusate sodium  100 mg Oral BID   doxazosin  1 mg Oral BID   epoetin (EPOGEN/PROCRIT) injection  4,000 Units Intravenous Q T,Th,Sa-HD   fluticasone furoate-vilanterol  1 puff Inhalation Daily   furosemide  80 mg Oral Q M,W,F   hydrALAZINE  25 mg Oral TID   insulin aspart  0-15 Units Subcutaneous TID AC & HS   insulin aspart  3 Units Subcutaneous TID WC   insulin glargine-yfgn  20 Units Subcutaneous Daily   isosorbide mononitrate  60 mg Oral Daily   multivitamin  1 tablet Oral QHS   neomycin-polymyxin b-dexamethasone  1 Application Right Eye TID   omega-3 acid ethyl esters  1 g Oral Daily   pantoprazole  40 mg Oral Daily   rosuvastatin  10 mg Oral Once per day on Monday Wednesday Friday   sodium chloride flush  3 mL Intravenous Q12H   sodium chloride, acetaminophen **OR** acetaminophen, alteplase, calcium carbonate, diphenhydrAMINE-zinc acetate, heparin, hydrOXYzine, ipratropium-albuterol, morphine injection, nitroGLYCERIN, ondansetron **OR** ondansetron (ZOFRAN) IV, oxyCODONE, polyethylene glycol, simethicone, sodium chloride flush, traZODone  Assessment/ Plan:  Mr. Ronnell Clinger is a 82 y.o.  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 Due to progression of kidney disease and outpatient diuretic therapy failure, we feel this patient has progressed to end stage renal disease.  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 CRRT from 9/12 to 9/13 Receiving dialysis today, seated in  chair.  UF goal 3L as tolerated.  Next treatment scheduled for Tuesday.  Outpatient clinic arranged at [facility] on TTS.  Patient will discharge to CIR early next week.   Acute exacerbation of chronic diastolic congestive heart failure: complicated with acute respiratory failure and pulmonary hypertension. -Will continue to manage fluid with dialysis - oral Furosemide 80mg  on non-HD days  Diabetes mellitus type II with chronic kidney disease - holding empagliflozin - Oral steroids. - Glucose elevated at times  4.  Anemia of chronic kidney disease. Hgb 10.8.  Will hold EPO for now   5.  Secondary hyperparathyroidism.  Continue to periodically monitor bone metabolism parameters.  Will continue to monitor bone minerals during this admission.    LOS: 30 Cobin Cadavid 10/5/202412:11 PM

## 2022-12-26 NOTE — Progress Notes (Signed)
Progress Note   Patient: Kyle Roberson GNF:[id number] DOB: Dec 14, 1940 DOA: 11/26/2022     30 DOS: the patient was seen and examined on 12/26/2022   Brief hospital course: 81yo with h/o CAD, stage 5 CKD, chronic diastolic CHF, BLE lymphedema, afib on Eliquis, PAH, and SDH presented on 09/05 with weight gain and SOB.     Hospital course / major events:  He was diagnosed with acute on chronic CHF and started on Lasix drip.  Cardiology was consulted. Echo this admission with EF 55-60%, mild LVH, moderate RV enlargement with mildly decreased RV systolic function, PASP 73 mmHg, severe LAE, mild-moderate TR, IVC dilated.  IV midodrine drip 09/09.   RHC done on 09/11 with elevated R > L filling pressures, mostly right-sided, worsening renal function and decreased UOP so Lasix stopped.  Started on CRRT 09/12 for additional volume removal and completed this. 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  He is on IV Lasix.  Remains on heparin gtt for Afib, has been bradycardic w/ ventricular rates 40-50s but go into 60-70 range on standing. 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  Dry weight around ~220 pounds Monitor input output and creatinine fluid management w/ dialysis and oral furosemide 80mg  daily on non-HD days  Overall poor prognosis given advanced renal disease. Cardiology on board, continue HD for fluid management.   Acute hypoxic respiratory  failure d/t volume overload / CHF/COPD Supplemental O2 to wean as able.  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 He is on TTS schedule. Dialysis following catheter placement 09/23 which became nonfunctional on 9/24 Overall very poor prognosis 9/25 permanent HD catheter was placed by vascular surgery. Continue HD per nephrology team. Outpatient clinic arranged at [facility] on TTS.    COPD exacerbation Patient does not have formal diagnosis but patient is having wheezing for past few days, initially he refused nebulizer treatment due to tachycardia in the past On 9/28 discussed again regarding nebulizer treatment, patient and his wife agreed Lianne Moris and budesonide nebulizer treatment twice daily 9/29 started Solu-Medrol 40 mg IV twice daily for 2 doses followed by prednisone 40 mg p.o. daily for 5 days. DuoNeb every 6 hourly scheduled, transition to as needed after improvement 10/1 start Breo Ellipta tomorrow a.m. and stop Brovana and budesonide nebulizer after tonight dose   CAD with remote PCI Continue statin, No ASA 9/30 resumed Imdur 60 mg p.o. daily, patient had an episode of chest pain at night on 9/29 which resolved after 2 doses of nitroglycerin. Cardiology following.   Atrial fibrillation: Permanent. Bradycardic currently Apixaban was held and patient was on heparin which was discontinued and apixaban 2.5 mg p.o. twice daily was resumed.   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     Hyperphosphatemia Binders per nephro    Hypertension Continue doxazosin, hydralazine   Hyperlipidemia Continue rosuvastatin, Lovaza   Bilateral lower extremity lymphedema, chronic Chronic skin changes, dermatitis and possible fungal infection 9/25 Started Lotrisone cream twice daily   GERD  continue pantoprazole 40 mg p.o. daily,Tums as well    Obesity Complicates overall prognosis Body mass index is 35.73 kg/m.  Diet, exercise and weight reduction advised. Fluid management per HD.      Out of bed to chair. Incentive spirometry. Nursing supportive care. Fall, aspiration precautions. DVT prophylaxis: Eliquis   Code Status: Full Code  Subjective: Patient is seen and examined today morning.  He is lying comfortably. No overnight issues. Eating fair. Asked about discharge plan  Physical Exam: Vitals:   12/26/22 1200 12/26/22 1207 12/26/22 1214 12/26/22 1329  BP: 112/61 (!) 122/58 (!) 119/59 (!) 158/62  Pulse: 69 63 (!) 58 62  Resp: (!) 22 (!) 23 (!) 28 20  Temp:  (!) 97.1 F (36.2 C)  97.6 F (36.4 C)  TempSrc:  Oral  Oral  SpO2: 100% 97% 98% 100%  Weight:      Height:        General - Elderly obese  Caucasian male, no apparent distress HEENT - PERRLA, EOMI, atraumatic head, non tender sinuses. Lung - Clear, bibasal rales, rhonchi, no wheezes. Heart - S1, S2 heard, no murmurs, rubs, 2+  pedal edema. Abdomen - Soft, non tender, non distended, bowel sounds good Neuro - Alert, awake and oriented x 3, non focal exam. Skin - Warm and dry. Chronic lower extremity skin changes, lymphedema  Data Reviewed:      Latest Ref Rng & Units 12/26/2022   10:21 AM 12/24/2022    9:50 AM 12/22/2022    9:06 AM  CBC  WBC 4.0 - 10.5 K/uL 10.8  8.3  7.9   Hemoglobin 13.0 - 17.0 g/dL 40.9  9.0  9.2   Hematocrit 39.0 - 52.0 % 33.8  28.8  28.7   Platelets 150 - 400 K/uL 253  191  174       Latest Ref Rng & Units 12/26/2022   10:21 AM 12/25/2022    6:45 AM 12/24/2022    9:50 AM  BMP  Glucose 70 - 99 mg/dL 811  914   782   BUN 8 - 23 mg/dL 40  59  79   Creatinine 0.61 - 1.24 mg/dL 9.56  2.13  0.86   Sodium 135 - 145 mmol/L 134  137  136   Potassium 3.5 - 5.1 mmol/L 3.2  3.9  3.6   Chloride 98 - 111 mmol/L 97  98  97   CO2 22 - 32 mmol/L 27  25  25    Calcium 8.9 - 10.3 mg/dL 8.0  8.1  7.9    No results found.   Family Communication: Discussed with patient, wife at bedside, they understand and agree. All questions answereed.    Disposition: Status is: Inpatient Remains inpatient appropriate because: HD, Inpatient rehab placement pending  Planned Discharge Destination: Rehab     Time spent: 36 minutes  Author: Marcelino Duster, MD 12/26/2022 3:18 PM Secure chat 7am to 7pm For on call review [email or web address].

## 2022-12-27 DIAGNOSIS — I5033 Acute on chronic diastolic (congestive) heart failure: Secondary | ICD-10-CM | POA: Diagnosis not present

## 2022-12-27 DIAGNOSIS — E669 Obesity, unspecified: Secondary | ICD-10-CM | POA: Diagnosis not present

## 2022-12-27 DIAGNOSIS — E1122 Type 2 diabetes mellitus with diabetic chronic kidney disease: Secondary | ICD-10-CM | POA: Diagnosis not present

## 2022-12-27 DIAGNOSIS — E8770 Fluid overload, unspecified: Secondary | ICD-10-CM | POA: Diagnosis not present

## 2022-12-27 LAB — GLUCOSE, CAPILLARY
Glucose-Capillary: 73 mg/dL (ref 70–99)
Glucose-Capillary: 185 mg/dL — ABNORMAL HIGH (ref 70–99)
Glucose-Capillary: 140 mg/dL — ABNORMAL HIGH (ref 70–99)
Glucose-Capillary: 232 mg/dL — ABNORMAL HIGH (ref 70–99)

## 2022-12-27 LAB — RENAL FUNCTION PANEL
Albumin: 3.1 g/dL — ABNORMAL LOW (ref 3.5–5.0)
Anion gap: 14 (ref 5–15)
BUN: 52 mg/dL — ABNORMAL HIGH (ref 8–23)
CO2: 25 mmol/L (ref 22–32)
Calcium: 8 mg/dL — ABNORMAL LOW (ref 8.9–10.3)
Chloride: 98 mmol/L (ref 98–111)
Creatinine, Ser: 3.2 mg/dL — ABNORMAL HIGH (ref 0.61–1.24)
GFR, Estimated: 18 mL/min — ABNORMAL LOW (ref >60)
Glucose, Bld: 74 mg/dL (ref 70–99)
Phosphorus: 4.2 mg/dL (ref 2.5–4.6)
Potassium: 3.6 mmol/L (ref 3.5–5.1)
Sodium: 137 mmol/L (ref 135–145)

## 2022-12-27 NOTE — Progress Notes (Signed)
[facility]  ROUNDING NOTE   Subjective:   Patient sitting up in bed Alert and oriented Wife at bedside Completed breakfast Remains on room air  Dialysis yesterday, tolerated well  Objective:  Vital signs in last 24 hours:  Temp:  [97.6 F (36.4 C)-97.8 F (36.6 C)] 97.6 F (36.4 C) (10/06 1207) Pulse Rate:  [47-71] 65 (10/06 1207) Resp:  [16-22] 20 (10/06 1207) BP: (115-162)/(47-77) 132/57 (10/06 1207) SpO2:  [94 %-100 %] 94 % (10/06 1207)  Weight change:  Filed Weights   12/24/22 1401  Weight: 93.3 kg    Intake/Output: I/O last 3 completed shifts: In: 600 [P.O.:600] Out: 3550 [Urine:550; Other:3000]   Intake/Output this shift:  No intake/output data recorded.  Physical Exam: General: NAD  Head: Normocephalic, atraumatic. Moist oral mucosal membranes  Eyes: Anicteric, ectropion  Lungs:  Clear to auscultation, room air  Heart: S1S2 no rubs  Abdomen:  Soft, nontender  Extremities: 1+ peripheral edema.  Neurologic: Awake, alert  Skin: No lesions  Access: Rt Chest permcath    Basic Metabolic Panel: Recent Labs  Lab 12/20/22 2144 12/21/22 0525 12/22/22 0906 12/23/22 0540 12/24/22 0950 12/25/22 0645 12/26/22 1021 12/26/22 1512 12/27/22 0558  NA 136   < > 136 133* 136 137 134* 133* 137  K 4.3   < > 4.7 3.9 3.6 3.9 3.2* 3.6 3.6  CL 99   < > 100 96* 97* 98 97* 97* 98  CO2 25   < > 23 27 25 25 27 25 25   GLUCOSE 299*   < > 246* 189* 190* 136* 182* 262* 74  BUN 43*   < > 75* 47* 79* 59* 40* 36* 52*  CREATININE 3.69*   < > 4.91* 3.20* 4.22* 3.19* 2.38* 2.48* 3.20*  CALCIUM 8.6*   < > 8.5* 8.1* 7.9* 8.1* 8.0* 8.1* 8.0*  MG 1.7  --  2.1  --   --   --   --   --   --   PHOS  --    < > 5.5* 4.0 5.1* 4.1 2.3*  --  4.2   < > = values in this interval not displayed.    Liver Function Tests: Recent Labs  Lab 12/23/22 0540 12/24/22 0950 12/25/22 0645 12/26/22 1021 12/27/22 0558  ALBUMIN 3.3* 3.0* 3.3* 3.4* 3.1*   No results for input(s):  "LIPASE", "AMYLASE" in the last 168 hours. No results for input(s): "AMMONIA" in the last 168 hours.  CBC: Recent Labs  Lab 12/20/22 2144 12/22/22 0906 12/24/22 0950 12/26/22 1021  WBC 6.4 7.9 8.3 10.8*  NEUTROABS  --   --  6.8  --   HGB 10.1* 9.2* 9.0* 10.8*  HCT 32.0* 28.7* 28.8* 33.8*  MCV 95.8 92.0 95.0 93.1  PLT 163 174 191 253    Cardiac Enzymes: No results for input(s): "CKTOTAL", "CKMB", "CKMBINDEX", "TROPONINI" in the last 168 hours.  BNP: Invalid input(s): "POCBNP"  CBG: Recent Labs  Lab 12/26/22 1405 12/26/22 1605 12/26/22 2128 12/27/22 0759 12/27/22 1205  GLUCAP 138* 327* 237* 73 185*    Microbiology: Results for orders placed or performed during the hospital encounter of 11/26/22  MRSA Next Gen by PCR, Nasal     Status: None   Collection Time: 11/30/22  4:10 PM   Specimen: Nasal Mucosa; Nasal Swab  Result Value Ref Range Status   MRSA by PCR Next Gen NOT DETECTED NOT DETECTED Final    Comment: (NOTE) The GeneXpert MRSA Assay (FDA approved for NASAL  specimens only), is one component of a comprehensive MRSA colonization surveillance program. It is not intended to diagnose MRSA infection nor to guide or monitor treatment for MRSA infections. Test performance is not FDA approved in patients less than 51 years old. Performed at [facility], [address] [place], [place] [address]     Coagulation Studies: No results for input(s): "LABPROT", "INR" in the last 72 hours.    Urinalysis: No results for input(s): "COLORURINE", "LABSPEC", "PHURINE", "GLUCOSEU", "HGBUR", "BILIRUBINUR", "KETONESUR", "PROTEINUR", "UROBILINOGEN", "NITRITE", "LEUKOCYTESUR" in the last 72 hours.  Invalid input(s): "APPERANCEUR"    Imaging: No results found.   Medications:    sodium chloride      apixaban  2.5 mg Oral BID   vitamin C  500 mg Oral BID   Chlorhexidine Gluconate Cloth  6 each Topical Daily   cholecalciferol  2,000 Units Oral Daily    clotrimazole-betamethasone   Topical BID   docusate sodium  100 mg Oral BID   doxazosin  1 mg Oral BID   fluticasone furoate-vilanterol  1 puff Inhalation Daily   furosemide  80 mg Oral Q M,W,F   hydrALAZINE  25 mg Oral TID   insulin aspart  0-15 Units Subcutaneous TID AC & HS   insulin aspart  3 Units Subcutaneous TID WC   insulin glargine-yfgn  20 Units Subcutaneous Daily   isosorbide mononitrate  60 mg Oral Daily   multivitamin  1 tablet Oral QHS   neomycin-polymyxin b-dexamethasone  1 Application Right Eye TID   omega-3 acid ethyl esters  1 g Oral Daily   pantoprazole  40 mg Oral Daily   rosuvastatin  10 mg Oral Once per day on Monday Wednesday Friday   sodium chloride flush  3 mL Intravenous Q12H   sodium chloride, acetaminophen **OR** acetaminophen, calcium carbonate, diphenhydrAMINE-zinc acetate, hydrOXYzine, ipratropium-albuterol, morphine injection, nitroGLYCERIN, ondansetron **OR** ondansetron (ZOFRAN) IV, oxyCODONE, polyethylene glycol, simethicone, sodium chloride flush, traZODone  Assessment/ Plan:  [name] is a 82 y.o.  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 Due to progression of kidney disease and outpatient diuretic therapy failure, we feel this patient has progressed to end stage renal disease.  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 CRRT from 9/12 to 9/13 Dialysis yesterday in chair tolerated well. UF 3L achieved.  Next treatment scheduled for Tuesday.  Outpatient clinic arranged at [facility] on TTS.  Patient will discharge to CIR early next week.   Acute exacerbation of chronic  diastolic congestive heart failure: complicated with acute respiratory failure and pulmonary hypertension. -Fluid well managed with dialysis and diuretic.  - oral Furosemide 80mg  on non-HD days  Diabetes mellitus type II with chronic kidney disease - holding empagliflozin - Steroids during this admission  4.  Anemia of chronic kidney disease. Hgb 10.8. Willa assess need for EPO once below 10  5.  Secondary hyperparathyroidism.  Continue to periodically monitor bone metabolism parameters.  Calcium and phosphorus within desired range.    LOS: 31 Kyle Roberson 10/6/202412:53 PM

## 2022-12-27 NOTE — Progress Notes (Addendum)
Progress Note   Patient: Kyle Roberson OZH:[id number] DOB: Dec 08, 1940 DOA: 11/26/2022     31 DOS: the patient was seen and examined on 12/27/2022   Brief hospital course: 81yo with h/o CAD, stage 5 CKD, chronic diastolic CHF, BLE lymphedema, afib on Eliquis, PAH, and SDH presented on 09/05 with weight gain and SOB.     Hospital course / major events:  Diagnosed with acute on chronic CHF and started on Lasix drip.  Cardiology was consulted. Echo this admission with EF 55-60%, mild LVH, moderate RV enlargement with mildly decreased RV systolic function, PASP 73 mmHg, severe LAE, mild-moderate TR, IVC dilated.  IV midodrine drip 09/09.   RHC done on 09/11 with elevated R > L filling pressures, mostly right-sided, worsening renal function and decreased UOP so Lasix stopped.  Started on CRRT 09/12 for additional volume removal and completed this. 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  He is on IV Lasix.  Was on heparin gtt for Afib, has been bradycardic w/ ventricular rates 40-50s but go into 60-70 range on standing. 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  Dry weight around ~220 pounds Monitor input output and creatinine fluid management w/ dialysis and oral furosemide 80mg  daily on non-HD days  Overall poor prognosis given advanced renal disease. Palliative discussed with him and his wife. Cardiology on board, continue HD for fluid  management.   Acute hypoxic respiratory failure d/t volume overload / CHF/COPD Supplemental O2 to wean as able.  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 He is on TTS schedule. Dialysis following catheter placement 09/23 which became nonfunctional on 9/24 Overall very poor prognosis 9/25 permanent HD catheter was placed by vascular surgery. Continue HD per nephrology team. Outpatient clinic arranged at [facility] on TTS.    COPD exacerbation Patient does not have formal diagnosis but patient is having wheezing for past few days, initially he refused nebulizer treatment due to tachycardia in the past On 9/28 discussed again regarding nebulizer treatment, patient and his wife agreed Lianne Moris and budesonide nebulizer treatment twice daily 9/29 started Solu-Medrol 40 mg IV twice daily for 2 doses followed by prednisone 40 mg p.o. daily for 5 days. DuoNeb every 6 hourly scheduled, transition to as needed after improvement 10/1 start Breo Ellipta tomorrow a.m. and stop Brovana and budesonide nebulizer after tonight dose   CAD with remote PCI Continue statin, No ASA 9/30 resumed Imdur 60 mg p.o. daily, patient had an episode of chest pain at night on 9/29 which resolved after 2 doses of nitroglycerin. Cardiology following.   Atrial fibrillation: Permanent. Bradycardic currently Apixaban was held and patient was on heparin which was discontinued and apixaban 2.5 mg p.o. twice daily was resumed.   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      Hyperphosphatemia Binders per nephro   Hypertension Continue doxazosin, hydralazine   Hyperlipidemia Continue rosuvastatin, Lovaza   Bilateral lower extremity lymphedema, chronic Chronic skin changes, dermatitis and possible fungal infection 9/25 Started Lotrisone cream twice daily   GERD  continue pantoprazole 40 mg p.o. daily,Tums as well    Obesity Complicates overall prognosis Body mass index is 35.73 kg/m.  Diet, exercise and weight reduction advised. Fluid management per HD.  Out of bed to chair. Incentive spirometry. Nursing supportive care. Fall, aspiration precautions. DVT prophylaxis: Eliquis   Code Status: Full Code  Subjective: Patient is seen and examined today morning.  He is lying comfortably. No overnight issues. Wife at bedside. He hopes improved strength with inpatient rehab placement.  Physical Exam: Vitals:   12/26/22 2131 12/27/22 0540 12/27/22 0802 12/27/22 1207  BP: (!) 162/68 115/77 (!) 134/59 (!) 132/57  Pulse: 66 (!) 47 (!) 49 65  Resp: 18 16 20 20   Temp: 97.7 F (36.5 C) 97.6 F (36.4 C) 97.8 F (36.6 C) 97.6 F (36.4 C)  TempSrc: Oral Oral Oral Oral  SpO2: 98%  97% 94%  Weight:      Height:        General - Elderly obese  Caucasian male, no apparent distress HEENT - PERRLA, EOMI, atraumatic head, non tender sinuses. Lung - Clear, bibasal rales, rhonchi, no wheezes. Heart - S1, S2 heard, no murmurs, rubs, 2+  pedal edema. Abdomen - Soft, non tender, non distended, bowel sounds good Neuro - Alert, awake and oriented x 3, non focal exam. Skin - Warm and dry. Chronic lower extremity skin changes, lymphedema  Data Reviewed:      Latest Ref Rng & Units 12/26/2022   10:21 AM 12/24/2022    9:50 AM 12/22/2022    9:06 AM  CBC  WBC 4.0 - 10.5 K/uL 10.8  8.3  7.9   Hemoglobin 13.0 - 17.0 g/dL 32.9  9.0  9.2   Hematocrit 39.0 - 52.0 % 33.8  28.8  28.7   Platelets 150 - 400 K/uL 253  191  174       Latest Ref Rng & Units 12/27/2022     5:58 AM 12/26/2022    3:12 PM 12/26/2022   10:21 AM  BMP  Glucose 70 - 99 mg/dL 74  518  841   BUN 8 - 23 mg/dL 52  36  40   Creatinine 0.61 - 1.24 mg/dL 6.60  6.30  1.60   Sodium 135 - 145 mmol/L 137  133  134   Potassium 3.5 - 5.1 mmol/L 3.6  3.6  3.2   Chloride 98 - 111 mmol/L 98  97  97   CO2 22 - 32 mmol/L 25  25  27    Calcium 8.9 - 10.3 mg/dL 8.0  8.1  8.0    No results found.   Family Communication: Discussed with patient, wife at bedside, they understand and agree. All questions answereed.    Disposition: Status is: Inpatient Remains inpatient appropriate because: HD, Inpatient rehab placement pending  Planned Discharge Destination: Rehab     Time spent: 37 minutes  Author: Marcelino Duster, MD 12/27/2022 1:31 PM Secure chat 7am to 7pm For on call review [email or web address].

## 2022-12-27 NOTE — Plan of Care (Signed)
Patient is approaching discharge

## 2022-12-28 ENCOUNTER — Encounter: Payer: Medicare HMO | Admitting: Internal Medicine

## 2022-12-28 DIAGNOSIS — E669 Obesity, unspecified: Secondary | ICD-10-CM | POA: Diagnosis not present

## 2022-12-28 DIAGNOSIS — E8770 Fluid overload, unspecified: Secondary | ICD-10-CM | POA: Diagnosis not present

## 2022-12-28 DIAGNOSIS — I5033 Acute on chronic diastolic (congestive) heart failure: Secondary | ICD-10-CM | POA: Diagnosis not present

## 2022-12-28 DIAGNOSIS — E1122 Type 2 diabetes mellitus with diabetic chronic kidney disease: Secondary | ICD-10-CM | POA: Diagnosis not present

## 2022-12-28 LAB — RENAL FUNCTION PANEL
Albumin: 3.2 g/dL — ABNORMAL LOW (ref 3.5–5.0)
Anion gap: 14 (ref 5–15)
BUN: 75 mg/dL — ABNORMAL HIGH (ref 8–23)
CO2: 24 mmol/L (ref 22–32)
Calcium: 8.1 mg/dL — ABNORMAL LOW (ref 8.9–10.3)
Chloride: 100 mmol/L (ref 98–111)
Creatinine, Ser: 4.94 mg/dL — ABNORMAL HIGH (ref 0.61–1.24)
GFR, Estimated: 11 mL/min — ABNORMAL LOW (ref >60)
Glucose, Bld: 169 mg/dL — ABNORMAL HIGH (ref 70–99)
Phosphorus: 5.5 mg/dL — ABNORMAL HIGH (ref 2.5–4.6)
Potassium: 3.7 mmol/L (ref 3.5–5.1)
Sodium: 138 mmol/L (ref 135–145)

## 2022-12-28 LAB — GLUCOSE, CAPILLARY
Glucose-Capillary: 90 mg/dL (ref 70–99)
Glucose-Capillary: 228 mg/dL — ABNORMAL HIGH (ref 70–99)
Glucose-Capillary: 132 mg/dL — ABNORMAL HIGH (ref 70–99)
Glucose-Capillary: 225 mg/dL — ABNORMAL HIGH (ref 70–99)
Glucose-Capillary: 208 mg/dL — ABNORMAL HIGH (ref 70–99)

## 2022-12-28 NOTE — TOC Progression Note (Signed)
Transition of Care [facility]) - Progression Note    Patient Details  Name: Kyle Roberson MRN: [id number] Date of Birth: 07/19/1940  [facility] CM/SW Contact  Darolyn Rua, [place] Phone Number: 12/28/2022, 8:41 AM  Clinical Narrative:     CSW reached out to [facility] with CIR regarding eta for discharge to CIR. CSW has received call from spouse [name] requesting update, CSW has requested CIR follow up with her.   Expected Discharge Plan: Home/Self Care Barriers to Discharge: No Barriers Identified  Expected Discharge Plan and Services       Living arrangements for the past 2 months: Single Family Home                                       Social Determinants of Health (SDOH) Interventions SDOH Screenings   Food Insecurity: No Food Insecurity (12/01/2022)  Housing: Low Risk  (12/01/2022)  Transportation Needs: No Transportation Needs (12/01/2022)  Utilities: Not At Risk (12/01/2022)  Depression (PHQ2-9): Low Risk  (07/27/2022)  Tobacco Use: Medium Risk (11/26/2022)    Readmission Risk Interventions     No data to display

## 2022-12-28 NOTE — Progress Notes (Signed)
   12/28/22 2000  BiPAP/CPAP/SIPAP  Reason BIPAP/CPAP not in use Non-compliant (patient declined, no distress noted)

## 2022-12-28 NOTE — Progress Notes (Signed)
Inpatient Rehab Admissions Coordinator:   Continue to await determination from [facility].  I sent a request to the supervisor to expedite review.    Estill Dooms, PT, DPT Admissions Coordinator [phone] 12/28/22  9:26 AM

## 2022-12-28 NOTE — Progress Notes (Signed)
Progress Note   Patient: Kyle Roberson QMV:[id number] DOB: [birth year] DOA: 11/26/2022     32 DOS: the patient was seen and examined on 12/28/2022   Brief hospital course: 81yo with h/o CAD, stage 5 CKD, chronic diastolic CHF, BLE lymphedema, afib on Eliquis, PAH, and SDH presented on 09/05 with weight gain and SOB.     Hospital course / major events:  Diagnosed with acute on chronic CHF and started on Lasix drip.  Cardiology was consulted. Echo this admission with EF 55-60%, mild LVH, moderate RV enlargement with mildly decreased RV systolic function, PASP 73 mmHg, severe LAE, mild-moderate TR, IVC dilated.  IV midodrine drip 09/09.   RHC done on 09/11 with elevated R > L filling pressures, mostly right-sided, worsening renal function and decreased UOP so Lasix stopped.  Started on CRRT 09/12 for additional volume removal and completed this. 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  He is on IV Lasix.  Was on heparin gtt for Afib, has been bradycardic w/ ventricular rates 40-50s but go into 60-70 range on standing. Resumed Eliquis 09/24 Permcath placed 12/14/22 and now pending outpatient rehab placement and HD chair    Consultants:  Cardiology - advanced HF team  Nephrology  PCCM Palliative care Surgery PT/OT [facility] team   Procedures: Echocardiogram 9/6 CVC 9/12 RHC on 9/11 CRRT HD 9/17, 9/18, 09/20, 09/24 Permcath placed 09/23   Assessment and Plan: Acute on chronic diastolic congestive heart failure Pulmonary HTN, severe - group 2 PH (from elevated LVEDP  Presented with volume overload with increasing weight up to 255 pounds.  Dry weight around ~220 pounds Monitor input output and creatinine fluid management w/ dialysis and oral furosemide 80mg  daily on non-HD days  Overall poor prognosis given advanced renal disease. Palliative discussed with him and his wife. Cardiology on board, continue HD for fluid  management.   Acute hypoxic respiratory failure d/t volume overload / CHF/COPD Supplemental O2 to wean as able.  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 He is on TTS schedule. Dialysis following catheter placement 09/23 which became nonfunctional on 9/24 Overall very poor prognosis 9/25 permanent HD catheter was placed by vascular surgery. Continue HD per nephrology team. Outpatient clinic arranged at [facility] on TTS.    COPD exacerbation Patient does not have formal diagnosis but patient is having wheezing for past few days, initially he refused nebulizer treatment due to tachycardia in the past On 9/28 discussed again regarding nebulizer treatment, patient and his wife agreed Lianne Moris and budesonide nebulizer treatment twice daily 9/29 started Solu-Medrol 40 mg IV twice daily for 2 doses followed by prednisone 40 mg p.o. daily for 5 days. DuoNeb every 6 hourly scheduled, transition to as needed after improvement 10/1 start Breo Ellipta tomorrow a.m. and stop Brovana and budesonide nebulizer after tonight dose   CAD with remote PCI Continue statin, No ASA 9/30 resumed Imdur 60 mg p.o. daily, patient had an episode of chest pain at night on 9/29 which resolved after 2 doses of nitroglycerin. Cardiology following.   Atrial fibrillation: Permanent. Bradycardic currently Apixaban was held and patient was on heparin which was discontinued and apixaban 2.5 mg p.o. twice daily was resumed.   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      Hyperphosphatemia Binders per nephro   Hypertension Continue doxazosin, hydralazine   Hyperlipidemia Continue rosuvastatin, Lovaza   Bilateral lower extremity lymphedema, chronic Chronic skin changes, dermatitis and possible fungal infection Continue Lotrisone cream twice daily   GERD  continue pantoprazole 40 mg p.o. daily,Tums as well    Obesity Complicates overall prognosis Body mass index is 35.73 kg/m.  Diet, exercise and weight reduction advised. Fluid management per HD.  Out of bed to chair. Incentive spirometry. Nursing supportive care. Fall, aspiration precautions. DVT prophylaxis: Eliquis   Code Status: Full Code  Subjective: Patient is seen and examined today morning.  He is lying comfortably. Eating breakfast, advised to work with incentive spirometry.   Physical Exam: Vitals:   12/27/22 2236 12/28/22 0453 12/28/22 0821 12/28/22 1131  BP: (!) 120/53 (!) 125/58 (!) 158/63 (!) 127/52  Pulse: 70 62 (!) 55 (!) 59  Resp: 20 18 18 20   Temp: 97.8 F (36.6 C) 97.9 F (36.6 C) 97.9 F (36.6 C) 97.7 F (36.5 C)  TempSrc: Oral  Oral Oral  SpO2: 99% 98% 100% 97%  Weight:      Height:        General - Elderly obese  Caucasian male, no apparent distress HEENT - PERRLA, EOMI, atraumatic head, non tender sinuses. Lung - Clear, bibasal rales, rhonchi, no wheezes. Heart - S1, S2 heard, no murmurs, rubs, 2+  pedal edema. Abdomen - Soft, non tender, non distended, bowel sounds good Neuro - Alert, awake and oriented x 3, non focal exam. Skin - Warm and dry. Chronic lower extremity skin changes, lymphedema  Data Reviewed:      Latest Ref Rng & Units 12/26/2022   10:21 AM 12/24/2022    9:50 AM 12/22/2022    9:06 AM  CBC  WBC 4.0 - 10.5 K/uL 10.8  8.3  7.9   Hemoglobin 13.0 - 17.0 g/dL 59.5  9.0  9.2   Hematocrit 39.0 - 52.0 % 33.8  28.8  28.7   Platelets 150 - 400 K/uL 253  191  174       Latest Ref Rng & Units 12/28/2022   10:36 AM 12/27/2022    5:58 AM  12/26/2022    3:12 PM  BMP  Glucose 70 - 99 mg/dL 638  74  756   BUN 8 - 23 mg/dL 75  52  36   Creatinine 0.61 - 1.24 mg/dL 4.33  2.95  1.88   Sodium 135 - 145 mmol/L 138  137  133   Potassium 3.5 - 5.1 mmol/L 3.7  3.6  3.6   Chloride 98 - 111 mmol/L 100  98  97   CO2 22 - 32 mmol/L 24  25  25    Calcium 8.9 - 10.3 mg/dL 8.1  8.0  8.1    No results found.   Family Communication: Discussed with patient, wife updated, they understand and agree. All questions answereed.    Disposition: Status is: Inpatient Remains inpatient appropriate because: HD, Inpatient rehab placement pending  Planned Discharge Destination: Rehab     Time spent: 36 minutes  Author: Marcelino Duster, MD 12/28/2022 4:39 PM Secure chat 7am to 7pm For on call review [email or web address].

## 2022-12-28 NOTE — Progress Notes (Signed)
Physical Therapy Treatment Patient Details Name: Kyle Roberson MRN: [id number] DOB: 1940-05-02 Today's Date: 12/28/2022   History of Present Illness Kyle Roberson is an 81yoM who comes to [facility] from OP nephrology on 9/5 due to LEE and ABD swelling. PMH: CKD, CHF. As of 9/9 he was transferred to ICU for IV milrinone gtt per [facility] heart failure MD. Started CRRT this admission and transitioned to HD.    PT Comments  Co-treat with OT to address functional mobility. Pt was received in bed and agreed to PT session. Pt performed 3 STS during today's session with MinA. The 1st STS was performed with MinA+1. The 2nd STS was performed with MinA+2 and was accompanied with 4 steps fwd>bwd prior to taking a 1-34min rest break. The 3rd STS was performed with MinA+1 and was accompanied with 4 steps fwd>bwd prior to getting back into bed to finish the session. MinA was necessary for pt to get back into bed, MaxA+2 was necessary for bed repositioning. Overall, pt is making satisfactory progress in comparison to previous visits. Pt will continue to benefit from skilled PT sessions to improve balance, strength, and functional mobility.    If plan is discharge home, recommend the following: Assist for transportation;Help with stairs or ramp for entrance;Assistance with cooking/housework;Two people to help with walking and/or transfers;Two people to help with bathing/dressing/bathroom   Can travel by private vehicle     No  Equipment Recommendations  None recommended by PT    Recommendations for Other Services       Precautions / Restrictions Precautions Precautions: Fall Restrictions Weight Bearing Restrictions: No     Mobility  Bed Mobility Overal bed mobility: Needs Assistance Bed Mobility: Sit to Supine, Supine to Sit Rolling: Min assist, +2 for physical assistance   Supine to sit: Min assist, +2 for physical assistance Sit to supine: Contact guard assist   General bed mobility comments: MaxA +2  for bed repositioning, MinA with BLE and CGA for trunk management. Pt is steadily improving with initiation of task    Transfers Overall transfer level: Needs assistance Equipment used: Rolling walker (2 wheels) Transfers: Sit to/from Stand Sit to Stand: Min assist, +2 physical assistance           General transfer comment: able to perform 3x STS from EOB. 1st STS was Min A+1, 2nd STS was Min A+2, 3rd STS was Min A+1. 1-31min rest breaks were necessary between each STS due to fatigued.    Ambulation/Gait Ambulation/Gait assistance: Min assist Gait Distance (Feet): 24 Feet Assistive device: Rolling walker (2 wheels) Gait Pattern/deviations: Step-to pattern       General Gait Details: Pt performed 2 STS with the use of RW (2 wheels) at EOB. With each STS, pt took 8 steps moving fwd towards the sink and bwd towards EOB with MinA. 1-2 min breaks were necessary between each STS due to fatigue.   Stairs             Wheelchair Mobility     Tilt Bed    Modified Rankin (Stroke Patients Only)       Balance Overall balance assessment: Needs assistance Sitting-balance support: Feet supported, Single extremity supported Sitting balance-Leahy Scale: Fair Sitting balance - Comments: able to maintain seated EOB balance during functional activities but required CGA for safety; cuing required to correct posterior lean during seated EOB Postural control: Posterior lean Standing balance support: Bilateral upper extremity supported, During functional activity, Reliant on assistive device for balance Standing balance-Leahy Scale: Fair  Cognition Arousal: Alert Behavior During Therapy: WFL for tasks assessed/performed Overall Cognitive Status: Within Functional Limits for tasks assessed                                 General Comments: AO x4        Exercises Other Exercises Other Exercises: 3x 8 steps in total, fw/bw  bed<>sink MinA for AD management and posterior lean Other Exercises: SLR bilaterally 15x. Verbal and tactile cues necessary.    General Comments        Pertinent Vitals/Pain Pain Assessment Pain Assessment: Faces Faces Pain Scale: Hurts a little bit Facial Expression: Tense Pain Location: Bilateral LE Pain Intervention(s): Monitored during session    Home Living                          Prior Function            PT Goals (current goals can now be found in the care plan section) Acute Rehab PT Goals Patient Stated Goal: to get better PT Goal Formulation: With patient/family Time For Goal Achievement: 12/30/22 Potential to Achieve Goals: Good Progress towards PT goals: Progressing toward goals    Frequency    Min 1X/week      PT Plan      Co-evaluation PT/OT/SLP Co-Evaluation/Treatment: Yes Reason for Co-Treatment: To address functional/ADL transfers PT goals addressed during session: Mobility/safety with mobility OT goals addressed during session: ADL's and self-care      AM-PAC PT "6 Clicks" Mobility   Outcome Measure  Help needed turning from your back to your side while in a flat bed without using bedrails?: A Little Help needed moving from lying on your back to sitting on the side of a flat bed without using bedrails?: A Little Help needed moving to and from a bed to a chair (including a wheelchair)?: A Lot Help needed standing up from a chair using your arms (e.g., wheelchair or bedside chair)?: A Lot Help needed to walk in hospital room?: A Lot Help needed climbing 3-5 steps with a railing? : Total 6 Click Score: 13    End of Session Equipment Utilized During Treatment: Gait belt Activity Tolerance: Patient limited by fatigue Patient left: in bed;with call bell/phone within reach;with bed alarm set Nurse Communication: Mobility status PT Visit Diagnosis: Unsteadiness on feet (R26.81);Muscle weakness (generalized) (M62.81)     Time:  8756-4332 PT Time Calculation (min) (ACUTE ONLY): 27 min  Charges:    $Therapeutic Exercise: 8-22 mins PT General Charges $$ ACUTE PT VISIT: 1 Visit                     Stepfon Rawles Sauvignon Howard SPT, LAT, ATC    Trayven Lumadue Sauvignon-Howard 12/28/2022, 3:27 PM

## 2022-12-28 NOTE — Care Management Important Message (Signed)
Important Message  Patient Details  Name: Kyle Roberson MRN: [id number] Date of Birth: 10-26-1940   Important Message Given:  Yes - Medicare IM     Bernadette Hoit 12/28/2022, 3:51 PM

## 2022-12-28 NOTE — Progress Notes (Signed)
[facility]  ROUNDING NOTE   Subjective:   Patient sitting up in bed Alert and oriented Wife at bedside No complaints to offer  Dialysis scheduled for Tuesday  Objective:  Vital signs in last 24 hours:  Temp:  [97.6 F (36.4 C)-97.9 F (36.6 C)] 97.9 F (36.6 C) (10/07 0821) Pulse Rate:  [55-71] 55 (10/07 0821) Resp:  [18-20] 18 (10/07 0821) BP: (115-158)/(49-63) 158/63 (10/07 0821) SpO2:  [94 %-100 %] 100 % (10/07 0821)  Weight change:  Filed Weights   12/24/22 1401  Weight: 93.3 kg    Intake/Output: I/O last 3 completed shifts: In: 240 [P.O.:240] Out: 400 [Urine:400]   Intake/Output this shift:  Total I/O In: 240 [P.O.:240] Out: -   Physical Exam: General: NAD  Head: Normocephalic, atraumatic. Moist oral mucosal membranes  Eyes: Anicteric, ectropion  Lungs:  Clear to auscultation, room air  Heart: S1S2 no rubs  Abdomen:  Soft, nontender  Extremities: 1+ peripheral edema.  Neurologic: Awake, alert  Skin: No lesions  Access: Rt Chest permcath    Basic Metabolic Panel: Recent Labs  Lab 12/22/22 0906 12/23/22 0540 12/24/22 0950 12/25/22 0645 12/26/22 1021 12/26/22 1512 12/27/22 0558  NA 136 133* 136 137 134* 133* 137  K 4.7 3.9 3.6 3.9 3.2* 3.6 3.6  CL 100 96* 97* 98 97* 97* 98  CO2 23 27 25 25 27 25 25   GLUCOSE 246* 189* 190* 136* 182* 262* 74  BUN 75* 47* 79* 59* 40* 36* 52*  CREATININE 4.91* 3.20* 4.22* 3.19* 2.38* 2.48* 3.20*  CALCIUM 8.5* 8.1* 7.9* 8.1* 8.0* 8.1* 8.0*  MG 2.1  --   --   --   --   --   --   PHOS 5.5* 4.0 5.1* 4.1 2.3*  --  4.2    Liver Function Tests: Recent Labs  Lab 12/23/22 0540 12/24/22 0950 12/25/22 0645 12/26/22 1021 12/27/22 0558  ALBUMIN 3.3* 3.0* 3.3* 3.4* 3.1*   No results for input(s): "LIPASE", "AMYLASE" in the last 168 hours. No results for input(s): "AMMONIA" in the last 168 hours.  CBC: Recent Labs  Lab 12/22/22 0906 12/24/22 0950 12/26/22 1021  WBC 7.9 8.3 10.8*  NEUTROABS   --  6.8  --   HGB 9.2* 9.0* 10.8*  HCT 28.7* 28.8* 33.8*  MCV 92.0 95.0 93.1  PLT 174 191 253    Cardiac Enzymes: No results for input(s): "CKTOTAL", "CKMB", "CKMBINDEX", "TROPONINI" in the last 168 hours.  BNP: Invalid input(s): "POCBNP"  CBG: Recent Labs  Lab 12/27/22 0759 12/27/22 1205 12/27/22 1724 12/27/22 2239 12/28/22 0818  GLUCAP 73 185* 140* 232* 90    Microbiology: Results for orders placed or performed during the hospital encounter of 11/26/22  MRSA Next Gen by PCR, Nasal     Status: None   Collection Time: 11/30/22  4:10 PM   Specimen: Nasal Mucosa; Nasal Swab  Result Value Ref Range Status   MRSA by PCR Next Gen NOT DETECTED NOT DETECTED Final    Comment: (NOTE) The GeneXpert MRSA Assay (FDA approved for NASAL specimens only), is one component of a comprehensive MRSA colonization surveillance program. It is not intended to diagnose MRSA infection nor to guide or monitor treatment for MRSA infections. Test performance is not FDA approved in patients less than 108 years old. Performed at [facility], [address] [place], [place] [address]     Coagulation Studies: No results for input(s): "LABPROT", "INR" in the last 72 hours.    Urinalysis:  No results for input(s): "COLORURINE", "LABSPEC", "PHURINE", "GLUCOSEU", "HGBUR", "BILIRUBINUR", "KETONESUR", "PROTEINUR", "UROBILINOGEN", "NITRITE", "LEUKOCYTESUR" in the last 72 hours.  Invalid input(s): "APPERANCEUR"    Imaging: No results found.   Medications:    sodium chloride      apixaban  2.5 mg Oral BID   vitamin C  500 mg Oral BID   Chlorhexidine Gluconate Cloth  6 each Topical Daily   cholecalciferol  2,000 Units Oral Daily   clotrimazole-betamethasone   Topical BID   docusate sodium  100 mg Oral BID   doxazosin  1 mg Oral BID   fluticasone furoate-vilanterol  1 puff Inhalation Daily   furosemide  80 mg Oral Q M,W,F   hydrALAZINE  25 mg Oral TID   insulin aspart  0-15 Units  Subcutaneous TID AC & HS   insulin aspart  3 Units Subcutaneous TID WC   insulin glargine-yfgn  20 Units Subcutaneous Daily   isosorbide mononitrate  60 mg Oral Daily   multivitamin  1 tablet Oral QHS   neomycin-polymyxin b-dexamethasone  1 Application Right Eye TID   omega-3 acid ethyl esters  1 g Oral Daily   pantoprazole  40 mg Oral Daily   rosuvastatin  10 mg Oral Once per day on Monday Wednesday Friday   sodium chloride flush  3 mL Intravenous Q12H   sodium chloride, acetaminophen **OR** acetaminophen, calcium carbonate, diphenhydrAMINE-zinc acetate, hydrOXYzine, ipratropium-albuterol, morphine injection, nitroGLYCERIN, ondansetron **OR** ondansetron (ZOFRAN) IV, oxyCODONE, polyethylene glycol, simethicone, sodium chloride flush, traZODone  Assessment/ Plan:  Kyle Roberson is a 82 y.o.  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 Due to progression of kidney disease and outpatient diuretic therapy failure, we feel this patient has progressed to end stage renal disease.  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 CRRT from 9/12 to 9/13 Next treatment scheduled for Tuesday.  Outpatient clinic arranged at [facility] on TTS.  Patient will discharge to CIR when bed available this week   Acute exacerbation of chronic diastolic congestive heart failure: complicated with acute respiratory failure and pulmonary hypertension. -Fluid well managed with dialysis and diuretic.  - Continue oral Furosemide 80mg  on non-HD days  Diabetes mellitus type II with chronic kidney disease - holding empagliflozin - Steroids during this  admission - Glucose remains elevated at times.   4.  Anemia of chronic kidney disease. Hgb 10.8. Will continue to assess need for EPO once below 10  5.  Secondary hyperparathyroidism.  Continue to periodically monitor bone metabolism parameters.  Will monitor bone minerals during this admission.    LOS: 32 Raiyah Speakman 10/7/202410:38 AM

## 2022-12-28 NOTE — Discharge Planning (Signed)
Informed OPHD clinic that patient will be going to CIR and not be starting them until he is discharged.  Current accepted OPHD clinic and Schedule: PLACEMENT RESOLVED: Outpatient Facility [facility] [address] [place], [place] [address] [phone]  Schedule: TTS 6:45am  Confirm time has not changed prior to discharge from CIR.  Dimas Chyle Dialysis Coordinator II  [facility] Cell: [phone] eFax: [phone] [email or web address]

## 2022-12-28 NOTE — Progress Notes (Signed)
Occupational Therapy Treatment Patient Details Name: Kyle Roberson MRN: [id number] DOB: [birth year] Today's Date: 12/28/2022   History of present illness Kyle Roberson is an 81yoM who comes to [facility] from OP nephrology on 9/5 due to LEE and ABD swelling. PMH: CKD, CHF. As of 9/9 he was transferred to ICU for IV milrinone gtt per [facility] heart failure MD. Started CRRT this admission and transitioned to HD.   OT comments  Kyle Roberson was seen for OT/PT co-treatment on this date. Upon arrival to room pt reclined in bed, fatigued from poor sleep however agreeable to tx. Pt requires MIN A x2 exit bed, CGA return to bed. MIN A don gown in standing, requires single UE support on RW. Tolerates x3 stands and walking forward/backward ~3 feet; MIN A + RW to stand from standard bed. Pt making good progress toward goals, will continue to follow POC. Discharge recommendation remains appropriate.       If plan is discharge home, recommend the following:  A lot of help with bathing/dressing/bathroom;Assistance with cooking/housework;Assist for transportation;Help with stairs or ramp for entrance;Two people to help with walking and/or transfers   Equipment Recommendations  BSC/3in1    Recommendations for Other Services      Precautions / Restrictions Precautions Precautions: Fall Restrictions Weight Bearing Restrictions: No       Mobility Bed Mobility Overal bed mobility: Needs Assistance Bed Mobility: Sit to Supine, Supine to Sit     Supine to sit: Min assist, +2 for physical assistance Sit to supine: Contact guard assist        Transfers Overall transfer level: Needs assistance Equipment used: Rolling walker (2 wheels) Transfers: Sit to/from Stand Sit to Stand: Min assist           General transfer comment: x3 stands from bed     Balance Overall balance assessment: Needs assistance Sitting-balance support: Feet supported, Single extremity supported Sitting balance-Leahy Scale:  Good     Standing balance support: Single extremity supported, During functional activity Standing balance-Leahy Scale: Fair                             ADL either performed or assessed with clinical judgement   ADL Overall ADL's : Needs assistance/impaired                                       General ADL Comments: MIN A don gown in standing, requires single UE support on RW.      Cognition Arousal: Alert Behavior During Therapy: WFL for tasks assessed/performed Overall Cognitive Status: Within Functional Limits for tasks assessed                                                     Pertinent Vitals/ Pain       Pain Assessment Pain Assessment: No/denies pain   Frequency  Min 1X/week        Progress Toward Goals  OT Goals(current goals can now be found in the care plan section)  Progress towards OT goals: Progressing toward goals  Acute Rehab OT Goals Patient Stated Goal: to go home OT Goal Formulation: With patient/family Time For Goal Achievement: 12/30/22 Potential to Achieve Goals: Good ADL Goals  Pt Will Perform Lower Body Dressing: with caregiver independent in assisting;sit to/from stand Pt Will Transfer to Toilet: ambulating;bedside commode;with supervision Pt Will Perform Toileting - Clothing Manipulation and hygiene: with caregiver independent in assisting Additional ADL Goal #1: Pt will verbalize plan to implement at least 2 learned ECS/falls prevention strategies to maximize safety/indep with ADL and mobility.  Plan      Co-evaluation    PT/OT/SLP Co-Evaluation/Treatment: Yes Reason for Co-Treatment: To address functional/ADL transfers PT goals addressed during session: Mobility/safety with mobility OT goals addressed during session: ADL's and self-care      AM-PAC OT "6 Clicks" Daily Activity     Outcome Measure   Help from another person eating meals?: None Help from another person taking  care of personal grooming?: A Little Help from another person toileting, which includes using toliet, bedpan, or urinal?: A Lot Help from another person bathing (including washing, rinsing, drying)?: A Little Help from another person to put on and taking off regular upper body clothing?: A Little Help from another person to put on and taking off regular lower body clothing?: A Lot 6 Click Score: 17    End of Session    OT Visit Diagnosis: Other abnormalities of gait and mobility (R26.89);Muscle weakness (generalized) (M62.81);Pain   Activity Tolerance Patient tolerated treatment well   Patient Left in bed;with call bell/phone within reach   Nurse Communication          Time: 3664-4034 OT Time Calculation (min): 24 min  Charges: OT General Charges $OT Visit: 1 Visit OT Treatments $Self Care/Home Management : 8-22 mins  Kathie Dike, M.S. OTR/L  12/28/22, 1:01 PM  ascom [phone]

## 2022-12-29 DIAGNOSIS — E669 Obesity, unspecified: Secondary | ICD-10-CM | POA: Diagnosis not present

## 2022-12-29 DIAGNOSIS — E8770 Fluid overload, unspecified: Secondary | ICD-10-CM | POA: Diagnosis not present

## 2022-12-29 DIAGNOSIS — E1122 Type 2 diabetes mellitus with diabetic chronic kidney disease: Secondary | ICD-10-CM | POA: Diagnosis not present

## 2022-12-29 DIAGNOSIS — I5033 Acute on chronic diastolic (congestive) heart failure: Secondary | ICD-10-CM | POA: Diagnosis not present

## 2022-12-29 LAB — RENAL FUNCTION PANEL
Albumin: 3.5 g/dL (ref 3.5–5.0)
Anion gap: 12 (ref 5–15)
BUN: 86 mg/dL — ABNORMAL HIGH (ref 8–23)
CO2: 25 mmol/L (ref 22–32)
Calcium: 8.4 mg/dL — ABNORMAL LOW (ref 8.9–10.3)
Chloride: 101 mmol/L (ref 98–111)
Creatinine, Ser: 5.68 mg/dL — ABNORMAL HIGH (ref 0.61–1.24)
GFR, Estimated: 9 mL/min — ABNORMAL LOW (ref >60)
Glucose, Bld: 116 mg/dL — ABNORMAL HIGH (ref 70–99)
Phosphorus: 6.7 mg/dL — ABNORMAL HIGH (ref 2.5–4.6)
Potassium: 4 mmol/L (ref 3.5–5.1)
Sodium: 138 mmol/L (ref 135–145)

## 2022-12-29 LAB — GLUCOSE, CAPILLARY
Glucose-Capillary: 123 mg/dL — ABNORMAL HIGH (ref 70–99)
Glucose-Capillary: 255 mg/dL — ABNORMAL HIGH (ref 70–99)
Glucose-Capillary: 164 mg/dL — ABNORMAL HIGH (ref 70–99)

## 2022-12-29 MED ORDER — HEPARIN SODIUM (PORCINE) 1000 UNIT/ML IJ SOLN
1000.0000 [IU] | INTRAMUSCULAR | Status: DC | PRN
  Administered 2022-12-29 (×2): 1000 [IU] via INTRAVENOUS

## 2022-12-29 NOTE — Progress Notes (Signed)
[facility]  ROUNDING NOTE   Subjective:   Patient seen and evaluated during dialysis    HEMODIALYSIS FLOWSHEET:  Blood Flow Rate (mL/min): 400 mL/min Arterial Pressure (mmHg): -160 mmHg Venous Pressure (mmHg): 170 mmHg TMP (mmHg): 7 mmHg Ultrafiltration Rate (mL/min): 1114 mL/min Dialysate Flow Rate (mL/min): 300 ml/min Dialysis Fluid Bolus: Normal Saline Bolus Amount (mL): 300 mL  Tolerating treatment well  Objective:  Vital signs in last 24 hours:  Temp:  [97.5 F (36.4 C)-97.7 F (36.5 C)] 97.5 F (36.4 C) (10/08 0954) Pulse Rate:  [55-63] 60 (10/08 1030) Resp:  [17-27] 25 (10/08 1030) BP: (122-155)/(51-56) 122/54 (10/08 1030) SpO2:  [97 %-100 %] 100 % (10/08 1030) Weight:  [93.3 kg] 93.3 kg (10/08 0954)  Weight change:  Filed Weights   12/24/22 1401 12/29/22 0954  Weight: 93.3 kg 93.3 kg    Intake/Output: I/O last 3 completed shifts: In: 240 [P.O.:240] Out: 450 [Urine:450]   Intake/Output this shift:  No intake/output data recorded.  Physical Exam: General: NAD  Head: Normocephalic, atraumatic. Moist oral mucosal membranes  Eyes: Anicteric, ectropion  Lungs:  Clear to auscultation, room air  Heart: S1S2 no rubs  Abdomen:  Soft, nontender  Extremities: 1+ peripheral edema.  Neurologic: Awake, alert  Skin: No lesions  Access: Rt Chest permcath    Basic Metabolic Panel: Recent Labs  Lab 12/25/22 0645 12/26/22 1021 12/26/22 1512 12/27/22 0558 12/28/22 1036 12/29/22 0433  NA 137 134* 133* 137 138 138  K 3.9 3.2* 3.6 3.6 3.7 4.0  CL 98 97* 97* 98 100 101  CO2 25 27 25 25 24 25   GLUCOSE 136* 182* 262* 74 169* 116*  BUN 59* 40* 36* 52* 75* 86*  CREATININE 3.19* 2.38* 2.48* 3.20* 4.94* 5.68*  CALCIUM 8.1* 8.0* 8.1* 8.0* 8.1* 8.4*  PHOS 4.1 2.3*  --  4.2 5.5* 6.7*    Liver Function Tests: Recent Labs  Lab 12/25/22 0645 12/26/22 1021 12/27/22 0558 12/28/22 1036 12/29/22 0433  ALBUMIN 3.3* 3.4* 3.1* 3.2* 3.5   No results  for input(s): "LIPASE", "AMYLASE" in the last 168 hours. No results for input(s): "AMMONIA" in the last 168 hours.  CBC: Recent Labs  Lab 12/24/22 0950 12/26/22 1021  WBC 8.3 10.8*  NEUTROABS 6.8  --   HGB 9.0* 10.8*  HCT 28.8* 33.8*  MCV 95.0 93.1  PLT 191 253    Cardiac Enzymes: No results for input(s): "CKTOTAL", "CKMB", "CKMBINDEX", "TROPONINI" in the last 168 hours.  BNP: Invalid input(s): "POCBNP"  CBG: Recent Labs  Lab 12/28/22 1128 12/28/22 1806 12/28/22 2123 12/28/22 2253 12/29/22 0812  GLUCAP 228* 132* 225* 208* 123*    Microbiology: Results for orders placed or performed during the hospital encounter of 11/26/22  MRSA Next Gen by PCR, Nasal     Status: None   Collection Time: 11/30/22  4:10 PM   Specimen: Nasal Mucosa; Nasal Swab  Result Value Ref Range Status   MRSA by PCR Next Gen NOT DETECTED NOT DETECTED Final    Comment: (NOTE) The GeneXpert MRSA Assay (FDA approved for NASAL specimens only), is one component of a comprehensive MRSA colonization surveillance program. It is not intended to diagnose MRSA infection nor to guide or monitor treatment for MRSA infections. Test performance is not FDA approved in patients less than 45 years old. Performed at [facility], [address] [place], [place] [address]     Coagulation Studies: No results for input(s): "LABPROT", "INR" in the last 72 hours.  Urinalysis: No results for input(s): "COLORURINE", "LABSPEC", "PHURINE", "GLUCOSEU", "HGBUR", "BILIRUBINUR", "KETONESUR", "PROTEINUR", "UROBILINOGEN", "NITRITE", "LEUKOCYTESUR" in the last 72 hours.  Invalid input(s): "APPERANCEUR"    Imaging: No results found.   Medications:    sodium chloride      apixaban  2.5 mg Oral BID   vitamin C  500 mg Oral BID   Chlorhexidine Gluconate Cloth  6 each Topical Daily   cholecalciferol  2,000 Units Oral Daily   clotrimazole-betamethasone   Topical BID   docusate sodium  100 mg Oral BID    doxazosin  1 mg Oral BID   fluticasone furoate-vilanterol  1 puff Inhalation Daily   furosemide  80 mg Oral Q M,W,F   hydrALAZINE  25 mg Oral TID   insulin aspart  0-15 Units Subcutaneous TID AC & HS   insulin aspart  3 Units Subcutaneous TID WC   insulin glargine-yfgn  20 Units Subcutaneous Daily   isosorbide mononitrate  60 mg Oral Daily   multivitamin  1 tablet Oral QHS   neomycin-polymyxin b-dexamethasone  1 Application Right Eye TID   omega-3 acid ethyl esters  1 g Oral Daily   pantoprazole  40 mg Oral Daily   rosuvastatin  10 mg Oral Once per day on Monday Wednesday Friday   sodium chloride flush  3 mL Intravenous Q12H   sodium chloride, acetaminophen **OR** acetaminophen, calcium carbonate, diphenhydrAMINE-zinc acetate, hydrOXYzine, ipratropium-albuterol, morphine injection, nitroGLYCERIN, ondansetron **OR** ondansetron (ZOFRAN) IV, oxyCODONE, polyethylene glycol, simethicone, sodium chloride flush, traZODone  Assessment/ Plan:  [name] is a 82 y.o.  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 Due to progression of kidney disease and outpatient diuretic therapy failure, we feel this patient has progressed to end stage renal disease.  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 CRRT from 9/12 to 9/13 Dialysis scheduled for today, UF goal 3L as tolerated. Will obtain standing weight after treatment.  Outpatient clinic arranged at [facility] on TTS.  Patient will discharge to CIR when bed available this week   Acute exacerbation of chronic diastolic congestive heart failure: complicated with acute respiratory  failure and pulmonary hypertension. -Fluid well managed with dialysis and diuretic.  - Continue oral Furosemide 80mg  on non-HD days  Diabetes mellitus type II with chronic kidney disease - holding empagliflozin - Steroids during this admission  4.  Anemia of chronic kidney disease. Hgb 10.8. Will continue to assess need for EPO once below 10  5.  Secondary hyperparathyroidism.  Continue to periodically monitor bone metabolism parameters.  Will monitor bone minerals during this admission.    LOS: 33 Elisah Parmer 10/8/202410:51 AM

## 2022-12-29 NOTE — Progress Notes (Signed)
Inpatient Rehab Admissions Coordinator:   Continue to await insurance determination.  Again requested that case be expedited for review since we have been waiting since 10/3.    Estill Dooms, PT, DPT Admissions Coordinator [phone] 12/29/22  10:13 AM

## 2022-12-29 NOTE — Progress Notes (Signed)
PT Cancellation Note  Patient Details Name: Kyle Roberson MRN: [id number] DOB: 24-Apr-1940   Cancelled Treatment:    Reason Eval/Treat Not Completed: Other (comment). Pt currently out of room for HD at this time. Re-attempt, pending time available.   Jackie Russman 12/29/2022, 10:45 AM Elizabeth Palau, PT, DPT, GCS [phone]

## 2022-12-29 NOTE — Progress Notes (Signed)
Progress Note   Patient: Kyle Roberson ZOX:[id number] DOB: 1940/06/12 DOA: 11/26/2022     33 DOS: the patient was seen and examined on 12/29/2022   Brief hospital course: 81yo with h/o CAD, stage 5 CKD, chronic diastolic CHF, BLE lymphedema, afib on Eliquis, PAH, and SDH presented on 09/05 with weight gain and SOB.     Hospital course / major events:  Diagnosed with acute on chronic CHF and started on Lasix drip.  Cardiology was consulted. Echo this admission with EF 55-60%, mild LVH, moderate RV enlargement with mildly decreased RV systolic function, PASP 73 mmHg, severe LAE, mild-moderate TR, IVC dilated.  IV midodrine drip 09/09.   RHC done on 09/11 with elevated R > L filling pressures, mostly right-sided, worsening renal function and decreased UOP so Lasix stopped.  Started on CRRT 09/12 for additional volume removal and completed this. 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  He is on IV Lasix.  Was on heparin gtt for Afib, has been bradycardic w/ ventricular rates 40-50s but go into 60-70 range on standing. Resumed Eliquis 09/24 Permcath placed 12/14/22 and now pending outpatient rehab placement and HD chair    Consultants:  Cardiology - advanced HF team  Nephrology  PCCM Palliative care Surgery PT/OT [facility] team   Procedures: Echocardiogram 9/6 CVC 9/12 RHC on 9/11 CRRT HD 9/17, 9/18, 09/20, 09/24 Permcath placed 09/23   Assessment and Plan: Acute on chronic diastolic congestive heart failure Pulmonary HTN, severe - group 2 PH (from elevated LVEDP  Presented with volume overload with increasing weight up to 255 pounds.  Dry weight around ~220 pounds Monitor input output and creatinine fluid management w/ dialysis and oral furosemide 80mg  daily on non-HD days  Overall poor prognosis given advanced renal disease. Palliative discussed with him and his wife. Cardiology on board, continue HD for fluid  management.   Acute hypoxic respiratory failure d/t volume overload / CHF/COPD Supplemental O2 to wean as able.  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 He is on TTS schedule. Dialysis following catheter placement 09/23 which became nonfunctional on 9/24 Overall very poor prognosis 9/25 permanent HD catheter was placed by vascular surgery. Continue HD per nephrology team. Outpatient clinic arranged at [facility] on TTS.    COPD exacerbation Patient does not have formal diagnosis but patient is having wheezing for past few days, initially he refused nebulizer treatment due to tachycardia in the past On 9/28 discussed again regarding nebulizer treatment, patient and his wife agreed Lianne Moris and budesonide nebulizer treatment twice daily 9/29 started Solu-Medrol 40 mg IV twice daily for 2 doses followed by prednisone 40 mg p.o. daily for 5 days. DuoNeb every 6 hourly scheduled, transition to as needed after improvement 10/1 start Breo Ellipta tomorrow a.m. and stop Brovana and budesonide nebulizer after tonight dose   CAD with remote PCI Continue statin, No ASA 9/30 resumed Imdur 60 mg p.o. daily, patient had an episode of chest pain at night on 9/29 which resolved after 2 doses of nitroglycerin. Cardiology following.   Atrial fibrillation: Permanent. Bradycardic currently Apixaban was held and patient was on heparin which was discontinued and apixaban 2.5 mg p.o. twice daily was resumed.   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      Hyperphosphatemia Binders per nephro   Hypertension Continue doxazosin, hydralazine   Hyperlipidemia Continue rosuvastatin, Lovaza   Bilateral lower extremity lymphedema, chronic Chronic skin changes, dermatitis and possible fungal infection Continue Lotrisone cream twice daily   GERD  continue pantoprazole 40 mg p.o. daily,Tums as well    Obesity Complicates overall prognosis Body mass index is 35.73 kg/m.  Diet, exercise and weight reduction advised. Fluid management per HD.  Out of bed to chair. Incentive spirometry. Nursing supportive care. Fall, aspiration precautions. DVT prophylaxis: Eliquis   Code Status: Full Code  Subjective: Patient is seen and examined today morning during HD.  He is lying comfortably. Denies any complaints.  No overnight issues.  Physical Exam: Vitals:   12/29/22 1115 12/29/22 1130 12/29/22 1200 12/29/22 1230  BP: (!) 132/55 133/69 (!) 119/54 125/68  Pulse: 64 71 73 74  Resp: (!) 25 (!) 25 (!) 32 (!) 30  Temp:      TempSrc:      SpO2: 100% 100% 99% 98%  Weight:      Height:        General - Elderly obese  Caucasian male, no apparent distress HEENT - PERRLA, EOMI, atraumatic head, non tender sinuses. Lung - Clear, bibasal rales, rhonchi, no wheezes. Heart - S1, S2 heard, no murmurs, rubs, 2+  pedal edema. Abdomen - Soft, non tender, non distended, bowel sounds good Neuro - Alert, awake and oriented x 3, non focal exam. Skin - Warm and dry. Chronic lower extremity skin changes, lymphedema  Data Reviewed:      Latest Ref Rng & Units 12/26/2022   10:21 AM 12/24/2022    9:50 AM 12/22/2022    9:06 AM  CBC  WBC 4.0 - 10.5 K/uL 10.8  8.3  7.9   Hemoglobin 13.0 - 17.0 g/dL 40.9  9.0  9.2   Hematocrit 39.0 - 52.0 % 33.8  28.8  28.7   Platelets 150 - 400 K/uL 253  191  174       Latest Ref Rng & Units 12/29/2022    4:33 AM 12/28/2022   10:36 AM 12/27/2022    5:58 AM  BMP  Glucose 70 - 99 mg/dL 811  914  74   BUN 8 - 23 mg/dL 86   75  52   Creatinine 0.61 - 1.24 mg/dL 7.82  9.56  2.13   Sodium 135 - 145 mmol/L 138  138  137   Potassium 3.5 - 5.1 mmol/L 4.0  3.7  3.6   Chloride 98 - 111 mmol/L 101  100  98   CO2 22 - 32 mmol/L 25  24  25    Calcium 8.9 - 10.3 mg/dL 8.4  8.1  8.0    No results found.   Family Communication: Discussed with patient, wife updated, they understand and agree. All questions answereed.    Disposition: Status is: Inpatient Remains inpatient appropriate because: HD, Inpatient rehab placement pending  Planned Discharge Destination: Rehab     Time spent: 36 minutes  Author: Marcelino Duster, MD 12/29/2022 12:56 PM Secure chat 7am to 7pm For on call review [email or web address].

## 2022-12-29 NOTE — Procedures (Signed)
Received patient in bed to unit.  Alert and oriented.  Informed consent signed and in chart.   TX duration: 2hrs and 46 mins.   Patient tolerated well. Dialyzer clotted. Dr. Wynelle Link ordered to rinseback and stop HD.   Transported back to the room  Alert, without acute distress.  Hand-off given to patient's nurse.   Access used: Right chest HD catheter.  Access issues: NONE  Total UF removed: 2.2L Medication(s) given: NONE    [facility]

## 2022-12-29 NOTE — Progress Notes (Signed)
Occupational Therapy Treatment Patient Details Name: Kyle Roberson MRN: [id number] DOB: [birth year] Today's Date: 12/29/2022   History of present illness Kyle Roberson is an 81yoM who comes to [facility] from OP nephrology on 9/5 due to LEE and ABD swelling. PMH: CKD, CHF. As of 9/9 he was transferred to ICU for IV milrinone gtt per [facility] heart failure MD. Started CRRT this admission and transitioned to HD.   OT comments  Kyle Roberson was seen for OT treatment on this date. Upon arrival to room pt reclined in bed, agreeable to tx. Pt requires MIN A + HHA for bed>chair step pivot t/f, MAX A pericare standing. MOD A standing from bed with HHA, improves with BUE support. Tolerated seated therex as described below. Pt making good progress toward goals, will continue to follow POC. Discharge recommendation remains appropriate.        If plan is discharge home, recommend the following:  A lot of help with bathing/dressing/bathroom;Assistance with cooking/housework;Assist for transportation;Help with stairs or ramp for entrance;Two people to help with walking and/or transfers   Equipment Recommendations  BSC/3in1    Recommendations for Other Services      Precautions / Restrictions Precautions Precautions: Fall Restrictions Weight Bearing Restrictions: No       Mobility Bed Mobility Overal bed mobility: Needs Assistance Bed Mobility: Supine to Sit     Supine to sit: Min assist          Transfers Overall transfer level: Needs assistance Equipment used: 1 person hand held assist Transfers: Sit to/from Stand Sit to Stand: Mod assist     Step pivot transfers: Min assist           Balance Overall balance assessment: Needs assistance Sitting-balance support: Feet supported, Single extremity supported Sitting balance-Leahy Scale: Good     Standing balance support: Single extremity supported, During functional activity Standing balance-Leahy Scale: Fair                              ADL either performed or assessed with clinical judgement   ADL Overall ADL's : Needs assistance/impaired                                       General ADL Comments: MIN A for simulated BSC t/f, MAX A pericare standing.      Cognition Arousal: Alert Behavior During Therapy: WFL for tasks assessed/performed Overall Cognitive Status: Within Functional Limits for tasks assessed                                                     Pertinent Vitals/ Pain       Pain Assessment Pain Assessment: No/denies pain   Frequency  Min 1X/week        Progress Toward Goals  OT Goals(current goals can now be found in the care plan section)  Progress towards OT goals: Progressing toward goals  Acute Rehab OT Goals Patient Stated Goal: to go home OT Goal Formulation: With patient/family Time For Goal Achievement: 01/12/23 Potential to Achieve Goals: Good ADL Goals Pt Will Perform Lower Body Dressing: with caregiver independent in assisting;sit to/from stand Pt Will Transfer to Toilet: ambulating;bedside commode;with supervision Pt Will Perform Toileting -  Clothing Manipulation and hygiene: with caregiver independent in assisting Additional ADL Goal #1: Pt will verbalize plan to implement at least 2 learned ECS/falls prevention strategies to maximize safety/indep with ADL and mobility.  Plan      Co-evaluation                 AM-PAC OT "6 Clicks" Daily Activity     Outcome Measure   Help from another person eating meals?: None Help from another person taking care of personal grooming?: A Little Help from another person toileting, which includes using toliet, bedpan, or urinal?: A Lot Help from another person bathing (including washing, rinsing, drying)?: A Little Help from another person to put on and taking off regular upper body clothing?: A Little Help from another person to put on and taking off regular lower body  clothing?: A Lot 6 Click Score: 17    End of Session    OT Visit Diagnosis: Other abnormalities of gait and mobility (R26.89);Muscle weakness (generalized) (M62.81);Pain   Activity Tolerance Patient tolerated treatment well   Patient Left in chair;with call bell/phone within reach   Nurse Communication Mobility status        Time: 1546-1610 OT Time Calculation (min): 24 min  Charges: OT General Charges $OT Visit: 1 Visit OT Treatments $Self Care/Home Management : 8-22 mins $Therapeutic Exercise: 8-22 mins  Kathie Dike, M.S. OTR/L  12/29/22, 4:25 PM  ascom [phone]

## 2022-12-29 NOTE — Plan of Care (Signed)
  Problem: Education: Goal: Ability to describe self-care measures that may prevent or decrease complications (Diabetes Survival Skills Education) will improve Outcome: Progressing Goal: Individualized Educational Video(s) Outcome: Progressing   Problem: Coping: Goal: Ability to adjust to condition or change in health will improve Outcome: Progressing   Problem: Fluid Volume: Goal: Ability to maintain a balanced intake and output will improve Outcome: Progressing   Problem: Health Behavior/Discharge Planning: Goal: Ability to identify and utilize available resources and services will improve Outcome: Progressing Goal: Ability to manage health-related needs will improve Outcome: Progressing   Problem: Metabolic: Goal: Ability to maintain appropriate glucose levels will improve Outcome: Progressing   Problem: Nutritional: Goal: Maintenance of adequate nutrition will improve Outcome: Progressing Goal: Progress toward achieving an optimal weight will improve Outcome: Progressing   Problem: Skin Integrity: Goal: Risk for impaired skin integrity will decrease Outcome: Progressing   Problem: Tissue Perfusion: Goal: Adequacy of tissue perfusion will improve Outcome: Progressing   Problem: Education: Goal: Knowledge of General Education information will improve Description: Including pain rating scale, medication(s)/side effects and non-pharmacologic comfort measures Outcome: Progressing   Problem: Health Behavior/Discharge Planning: Goal: Ability to manage health-related needs will improve Outcome: Progressing   Problem: Clinical Measurements: Goal: Ability to maintain clinical measurements within normal limits will improve Outcome: Progressing Goal: Will remain free from infection Outcome: Progressing Goal: Diagnostic test results will improve Outcome: Progressing Goal: Respiratory complications will improve Outcome: Progressing Goal: Cardiovascular complication will  be avoided Outcome: Progressing   Problem: Activity: Goal: Risk for activity intolerance will decrease Outcome: Progressing   Problem: Nutrition: Goal: Adequate nutrition will be maintained Outcome: Progressing   Problem: Coping: Goal: Level of anxiety will decrease Outcome: Progressing   Problem: Elimination: Goal: Will not experience complications related to bowel motility Outcome: Progressing Goal: Will not experience complications related to urinary retention Outcome: Progressing   Problem: Pain Managment: Goal: General experience of comfort will improve Outcome: Progressing   Problem: Safety: Goal: Ability to remain free from injury will improve Outcome: Progressing   Problem: Skin Integrity: Goal: Risk for impaired skin integrity will decrease Outcome: Progressing   Problem: Education: Goal: Understanding of CV disease, CV risk reduction, and recovery process will improve Outcome: Progressing Goal: Individualized Educational Video(s) Outcome: Progressing   Problem: Activity: Goal: Ability to return to baseline activity level will improve Outcome: Progressing   Problem: Cardiovascular: Goal: Ability to achieve and maintain adequate cardiovascular perfusion will improve Outcome: Progressing Goal: Vascular access site(s) Level 0-1 will be maintained Outcome: Progressing   Problem: Health Behavior/Discharge Planning: Goal: Ability to safely manage health-related needs after discharge will improve Outcome: Progressing   Problem: Education: Goal: Knowledge of disease and its progression will improve Outcome: Progressing   Problem: Fluid Volume: Goal: Compliance with measures to maintain balanced fluid volume will improve Outcome: Progressing   Problem: Health Behavior/Discharge Planning: Goal: Ability to manage health-related needs will improve Outcome: Progressing   Problem: Nutritional: Goal: Ability to make healthy dietary choices will  improve Outcome: Progressing   Problem: Clinical Measurements: Goal: Complications related to the disease process, condition or treatment will be avoided or minimized Outcome: Progressing

## 2022-12-30 DIAGNOSIS — I5033 Acute on chronic diastolic (congestive) heart failure: Secondary | ICD-10-CM | POA: Diagnosis not present

## 2022-12-30 LAB — GLUCOSE, CAPILLARY
Glucose-Capillary: 108 mg/dL — ABNORMAL HIGH (ref 70–99)
Glucose-Capillary: 286 mg/dL — ABNORMAL HIGH (ref 70–99)
Glucose-Capillary: 184 mg/dL — ABNORMAL HIGH (ref 70–99)
Glucose-Capillary: 91 mg/dL (ref 70–99)

## 2022-12-30 NOTE — Progress Notes (Signed)
Inpatient Rehab Admissions Coordinator:   I do not have a bed for Mr. Zuercher today.  Will plan on admit Friday to allow him time to receive dialysis tomorrow.  I will call his wife to update.   Estill Dooms, PT, DPT Admissions Coordinator [phone] 12/30/22  9:58 AM

## 2022-12-30 NOTE — Progress Notes (Signed)
[facility]  ROUNDING NOTE   Subjective:   Patient seen sitting up in bed, wife at bedside Appetite remains appropriate Remains on room air Lower extremity edema greatly improved since admission  Objective:  Vital signs in last 24 hours:  Temp:  [97.5 F (36.4 C)-98.6 F (37 C)] 97.5 F (36.4 C) (10/09 0810) Pulse Rate:  [49-68] 57 (10/09 0810) Resp:  [18-29] 18 (10/09 0810) BP: (135-156)/(52-87) 156/58 (10/09 0810) SpO2:  [97 %-100 %] 97 % (10/09 0810) Weight:  [89.5 kg] 89.5 kg (10/08 1319)  Weight change:  Filed Weights   12/24/22 1401 12/29/22 0954 12/29/22 1319  Weight: 93.3 kg 93.3 kg 89.5 kg    Intake/Output: I/O last 3 completed shifts: In: 0  Out: 2.2 [Other:2.2]   Intake/Output this shift:  Total I/O In: 120 [P.O.:120] Out: -   Physical Exam: General: NAD  Head: Normocephalic, atraumatic. Moist oral mucosal membranes  Eyes: Anicteric, ectropion  Lungs:  Clear to auscultation, room air  Heart: S1S2 no rubs  Abdomen:  Soft, nontender  Extremities: Trace peripheral edema.  Neurologic: Awake, alert  Skin: No lesions  Access: Rt Chest permcath    Basic Metabolic Panel: Recent Labs  Lab 12/25/22 0645 12/26/22 1021 12/26/22 1512 12/27/22 0558 12/28/22 1036 12/29/22 0433  NA 137 134* 133* 137 138 138  K 3.9 3.2* 3.6 3.6 3.7 4.0  CL 98 97* 97* 98 100 101  CO2 25 27 25 25 24 25   GLUCOSE 136* 182* 262* 74 169* 116*  BUN 59* 40* 36* 52* 75* 86*  CREATININE 3.19* 2.38* 2.48* 3.20* 4.94* 5.68*  CALCIUM 8.1* 8.0* 8.1* 8.0* 8.1* 8.4*  PHOS 4.1 2.3*  --  4.2 5.5* 6.7*    Liver Function Tests: Recent Labs  Lab 12/25/22 0645 12/26/22 1021 12/27/22 0558 12/28/22 1036 12/29/22 0433  ALBUMIN 3.3* 3.4* 3.1* 3.2* 3.5   No results for input(s): "LIPASE", "AMYLASE" in the last 168 hours. No results for input(s): "AMMONIA" in the last 168 hours.  CBC: Recent Labs  Lab 12/24/22 0950 12/26/22 1021  WBC 8.3 10.8*  NEUTROABS 6.8  --    HGB 9.0* 10.8*  HCT 28.8* 33.8*  MCV 95.0 93.1  PLT 191 253    Cardiac Enzymes: No results for input(s): "CKTOTAL", "CKMB", "CKMBINDEX", "TROPONINI" in the last 168 hours.  BNP: Invalid input(s): "POCBNP"  CBG: Recent Labs  Lab 12/29/22 0812 12/29/22 1645 12/29/22 2117 12/30/22 0811 12/30/22 1146  GLUCAP 123* 255* 164* 108* 286*    Microbiology: Results for orders placed or performed during the hospital encounter of 11/26/22  MRSA Next Gen by PCR, Nasal     Status: None   Collection Time: 11/30/22  4:10 PM   Specimen: Nasal Mucosa; Nasal Swab  Result Value Ref Range Status   MRSA by PCR Next Gen NOT DETECTED NOT DETECTED Final    Comment: (NOTE) The GeneXpert MRSA Assay (FDA approved for NASAL specimens only), is one component of a comprehensive MRSA colonization surveillance program. It is not intended to diagnose MRSA infection nor to guide or monitor treatment for MRSA infections. Test performance is not FDA approved in patients less than 73 years old. Performed at [facility], [address] [place], [place] [address]     Coagulation Studies: No results for input(s): "LABPROT", "INR" in the last 72 hours.    Urinalysis: No results for input(s): "COLORURINE", "LABSPEC", "PHURINE", "GLUCOSEU", "HGBUR", "BILIRUBINUR", "KETONESUR", "PROTEINUR", "UROBILINOGEN", "NITRITE", "LEUKOCYTESUR" in the last 72 hours.  Invalid input(s): "  APPERANCEUR"    Imaging: No results found.   Medications:    sodium chloride      apixaban  2.5 mg Oral BID   vitamin C  500 mg Oral BID   Chlorhexidine Gluconate Cloth  6 each Topical Daily   cholecalciferol  2,000 Units Oral Daily   clotrimazole-betamethasone   Topical BID   docusate sodium  100 mg Oral BID   doxazosin  1 mg Oral BID   fluticasone furoate-vilanterol  1 puff Inhalation Daily   furosemide  80 mg Oral Q M,W,F   hydrALAZINE  25 mg Oral TID   insulin aspart  0-15 Units Subcutaneous TID AC & HS    insulin aspart  3 Units Subcutaneous TID WC   insulin glargine-yfgn  20 Units Subcutaneous Daily   isosorbide mononitrate  60 mg Oral Daily   multivitamin  1 tablet Oral QHS   neomycin-polymyxin b-dexamethasone  1 Application Right Eye TID   omega-3 acid ethyl esters  1 g Oral Daily   pantoprazole  40 mg Oral Daily   rosuvastatin  10 mg Oral Once per day on Monday Wednesday Friday   sodium chloride flush  3 mL Intravenous Q12H   sodium chloride, acetaminophen **OR** acetaminophen, calcium carbonate, diphenhydrAMINE-zinc acetate, heparin sodium (porcine), hydrOXYzine, ipratropium-albuterol, morphine injection, nitroGLYCERIN, ondansetron **OR** ondansetron (ZOFRAN) IV, oxyCODONE, polyethylene glycol, simethicone, sodium chloride flush, traZODone  Assessment/ Plan:  [name] is a 82 y.o.  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 Due to progression of kidney disease and outpatient diuretic therapy failure, we feel this patient has progressed to end stage renal disease.  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 CRRT from 9/12 to 9/13 Dialysis received yesterday, UF 2.2 L achieved. Postdialysis weight of 89.5 kg Outpatient clinic arranged at [facility] on TTS.  CIR discharge tentatively scheduled for Friday.  Acute exacerbation of chronic diastolic congestive heart failure: complicated with acute respiratory failure and pulmonary hypertension. -Fluid well managed with dialysis and diuretic.  - Continue oral Furosemide 80mg  on non-HD days  Diabetes mellitus type II with chronic kidney disease - holding  empagliflozin - Steroids during this admission -Glucose remains elevated at times.  4.  Anemia of chronic kidney disease. Will continue to assess need for EPO once below 10  5.  Secondary hyperparathyroidism.  Continue to periodically monitor bone metabolism parameters.  Hyperphosphatemia noted.  Will continue to monitor and attempt to manage with dietary restrictions.  Continue cholecalciferol.   LOS: 34 Ladene Allocca 10/9/20241:07 PM

## 2022-12-30 NOTE — Plan of Care (Signed)
  Problem: Education: Goal: Ability to describe self-care measures that may prevent or decrease complications (Diabetes Survival Skills Education) will improve Outcome: Progressing Goal: Individualized Educational Video(s) Outcome: Progressing   Problem: Coping: Goal: Ability to adjust to condition or change in health will improve Outcome: Progressing   Problem: Fluid Volume: Goal: Ability to maintain a balanced intake and output will improve Outcome: Progressing   Problem: Health Behavior/Discharge Planning: Goal: Ability to identify and utilize available resources and services will improve Outcome: Progressing Goal: Ability to manage health-related needs will improve Outcome: Progressing   Problem: Metabolic: Goal: Ability to maintain appropriate glucose levels will improve Outcome: Progressing   Problem: Nutritional: Goal: Maintenance of adequate nutrition will improve Outcome: Progressing Goal: Progress toward achieving an optimal weight will improve Outcome: Progressing   Problem: Skin Integrity: Goal: Risk for impaired skin integrity will decrease Outcome: Progressing   Problem: Tissue Perfusion: Goal: Adequacy of tissue perfusion will improve Outcome: Progressing   Problem: Education: Goal: Knowledge of General Education information will improve Description: Including pain rating scale, medication(s)/side effects and non-pharmacologic comfort measures Outcome: Progressing   Problem: Health Behavior/Discharge Planning: Goal: Ability to manage health-related needs will improve Outcome: Progressing   Problem: Clinical Measurements: Goal: Ability to maintain clinical measurements within normal limits will improve Outcome: Progressing Goal: Will remain free from infection Outcome: Progressing Goal: Diagnostic test results will improve Outcome: Progressing Goal: Respiratory complications will improve Outcome: Progressing Goal: Cardiovascular complication will  be avoided Outcome: Progressing   Problem: Activity: Goal: Risk for activity intolerance will decrease Outcome: Progressing   Problem: Nutrition: Goal: Adequate nutrition will be maintained Outcome: Progressing   Problem: Coping: Goal: Level of anxiety will decrease Outcome: Progressing   Problem: Elimination: Goal: Will not experience complications related to bowel motility Outcome: Progressing Goal: Will not experience complications related to urinary retention Outcome: Progressing   Problem: Pain Managment: Goal: General experience of comfort will improve Outcome: Progressing   Problem: Safety: Goal: Ability to remain free from injury will improve Outcome: Progressing   Problem: Skin Integrity: Goal: Risk for impaired skin integrity will decrease Outcome: Progressing   Problem: Education: Goal: Understanding of CV disease, CV risk reduction, and recovery process will improve Outcome: Progressing Goal: Individualized Educational Video(s) Outcome: Progressing   Problem: Activity: Goal: Ability to return to baseline activity level will improve Outcome: Progressing   Problem: Cardiovascular: Goal: Ability to achieve and maintain adequate cardiovascular perfusion will improve Outcome: Progressing Goal: Vascular access site(s) Level 0-1 will be maintained Outcome: Progressing   Problem: Health Behavior/Discharge Planning: Goal: Ability to safely manage health-related needs after discharge will improve Outcome: Progressing   Problem: Education: Goal: Knowledge of disease and its progression will improve Outcome: Progressing   Problem: Fluid Volume: Goal: Compliance with measures to maintain balanced fluid volume will improve Outcome: Progressing   Problem: Health Behavior/Discharge Planning: Goal: Ability to manage health-related needs will improve Outcome: Progressing   Problem: Nutritional: Goal: Ability to make healthy dietary choices will  improve Outcome: Progressing   Problem: Clinical Measurements: Goal: Complications related to the disease process, condition or treatment will be avoided or minimized Outcome: Progressing

## 2022-12-30 NOTE — Progress Notes (Signed)
Progress Note   Patient: Kyle Roberson NFA:[id number] DOB: [birth year] DOA: 11/26/2022     34 DOS: the patient was seen and examined on 12/30/2022     Brief hospital course: 82yo with h/o CAD, stage 5 CKD, chronic diastolic CHF, BLE lymphedema, afib on Eliquis, PAH, and SDH presented on 09/05 with weight gain and SOB.     Hospital course / major events:  Diagnosed with acute on chronic CHF and started on Lasix drip.  Cardiology was consulted. Echo this admission with EF 55-60%, mild LVH, moderate RV enlargement with mildly decreased RV systolic function, PASP 73 mmHg, severe LAE, mild-moderate TR, IVC dilated.  IV midodrine drip 09/09.   RHC done on 09/11 with elevated R > L filling pressures, mostly right-sided, worsening renal function and decreased UOP so Lasix stopped.  Started on CRRT 09/12 for additional volume removal and completed this. 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  He is on IV Lasix.  Was on heparin gtt for Afib, has been bradycardic w/ ventricular rates 40-50s but go into 60-70 range on standing. Resumed Eliquis 09/24 Permcath placed 12/14/22 and now pending outpatient rehab placement and HD chair    Consultants:  Cardiology - advanced HF team  Nephrology  PCCM Palliative care Surgery PT/OT [facility] team   Procedures: Echocardiogram 9/6 CVC 9/12 RHC on 9/11 CRRT HD 9/17, 9/18, 09/20, 09/24 Permcath placed 09/23   Assessment and Plan: Acute on chronic diastolic congestive heart failure Pulmonary HTN, severe - group 2 PH (from elevated LVEDP  Presented with volume overload with increasing weight up to 255 pounds.  Dry weight around ~220 pounds Monitor input output and creatinine fluid management w/ dialysis and oral furosemide 80mg  daily on non-HD days  Overall poor prognosis given advanced renal disease. Palliative discussed with him and his wife. Cardiology on board, continue HD for fluid  management.   Acute hypoxic respiratory failure d/t volume overload / CHF/COPD Supplemental O2 to wean as able.   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 He is on TTS schedule. Dialysis following catheter placement 09/23 which became nonfunctional on 9/24 Overall very poor prognosis 9/25 permanent HD catheter was placed by vascular surgery. Continue HD per nephrology team. Outpatient clinic arranged at [facility] on TTS.  Plan of care discussed with nephrology team   COPD exacerbation Patient does not have formal diagnosis but patient is having wheezing for past few days, initially he refused nebulizer treatment due to tachycardia in the past On 9/28 discussed again regarding nebulizer treatment, patient and his wife agreed Lianne Moris and budesonide nebulizer treatment twice daily 9/29 started Solu-Medrol 40 mg IV twice daily for 2 doses followed by prednisone 40 mg p.o. daily for 5 days. DuoNeb every 6 hourly scheduled, transition to as needed after improvement 10/1 start Breo Ellipta tomorrow a.m. and stop Brovana and budesonide nebulizer after tonight dose   CAD with remote PCI Continue statin, No ASA 9/30 resumed Imdur 60 mg p.o. daily, patient had an episode of chest pain at night on 9/29 which resolved after 2 doses of nitroglycerin. Cardiology following.   Atrial fibrillation: Permanent. Bradycardic currently Apixaban was held and patient was on heparin which was discontinued and apixaban 2.5 mg p.o. twice daily was resumed.   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     Hyperphosphatemia Binders per nephro   Hypertension Continue doxazosin, hydralazine   Hyperlipidemia Continue rosuvastatin, Lovaza   Bilateral lower extremity lymphedema, chronic Chronic skin changes, dermatitis and possible fungal infection Continue Lotrisone cream twice daily   GERD  continue pantoprazole 40 mg p.o. daily,Tums as well    Obesity Complicates overall prognosis Body mass index is 35.73 kg/m.  Diet, exercise and weight reduction advised. Fluid management per HD.   Out of bed to chair. Incentive spirometry. Nursing supportive care. Fall, aspiration precautions. DVT prophylaxis: Eliquis   Code Status: Full Code   Family Communication: Discussed with patient, wife updated, they understand and agree. All questions answereed.       Disposition: Status is: Inpatient Remains inpatient appropriate because: HD, Inpatient rehab placement pending  Planned Discharge Destination: Rehab      Time spent: 41 minutes  Subjective:  Patient seen and examined at bedside this morning Denies nausea vomiting abdominal pain chest pain or cough  Physical Exam:    General - Elderly obese  Caucasian male, no apparent distress HEENT - PERRLA, EOMI, atraumatic head, non tender sinuses. Lung - Clear, bibasal rales, rhonchi, no wheezes. Heart - S1, S2 heard, no murmurs, rubs, 2+  pedal edema. Abdomen - Soft, non tender, non distended, bowel sounds good Neuro - Alert, awake and oriented x 3, non focal exam. Skin - Warm and dry. Chronic lower extremity skin changes, lymphedema   Data Reviewed: I have reviewed patient's lab results as shown below as well as PT OT documentation as well as nephrology documentation      Latest Ref Rng & Units 12/26/2022   10:21 AM 12/24/2022    9:50 AM 12/22/2022    9:06 AM  CBC  WBC 4.0 - 10.5 K/uL 10.8  8.3  7.9   Hemoglobin 13.0 - 17.0 g/dL 16.1  9.0  9.2   Hematocrit 39.0 - 52.0 % 33.8  28.8  28.7   Platelets  150 - 400 K/uL 253  191  174        Latest Ref Rng & Units 12/29/2022    4:33 AM 12/28/2022   10:36 AM 12/27/2022    5:58 AM  BMP  Glucose 70 - 99 mg/dL 096  045  74   BUN 8 - 23 mg/dL 86  75  52   Creatinine 0.61 - 1.24 mg/dL 4.09  8.11  9.14   Sodium 135 - 145 mmol/L 138  138  137   Potassium 3.5 - 5.1 mmol/L 4.0  3.7  3.6   Chloride 98 - 111 mmol/L 101  100  98   CO2 22 - 32 mmol/L 25  24  25    Calcium 8.9 - 10.3 mg/dL 8.4  8.1  8.0      Vitals:   12/29/22 2009 12/30/22 0409 12/30/22 0810 12/30/22 1548  BP: 135/61 (!) 145/52 (!) 156/58 (!) 131/52  Pulse: 68 (!) 49 (!) 57 71  Resp: 18 18 18  (!) 24  Temp: 98.6 F (37 C) 98 F (36.7 C) (!) 97.5 F (36.4 C) 97.8 F (36.6 C)  TempSrc: Oral Oral  Oral  SpO2: 100% 98% 97% 98%  Weight:      Height:  Author: Loyce Dys, MD 12/30/2022 6:02 PM  For on call review [email or web address].

## 2022-12-30 NOTE — Progress Notes (Signed)
Physical Therapy Treatment Patient Details Name: Kyle Roberson MRN: [id number] DOB: 1941/02/25 Today's Date: 12/30/2022   History of Present Illness Kyle Roberson is an 81yoM who comes to [facility] from OP nephrology on 9/5 due to LEE and ABD swelling. PMH: CKD, CHF. As of 9/9 he was transferred to ICU for IV milrinone gtt per [facility] heart failure MD. Started CRRT this admission and transitioned to HD.    PT Comments  Pt received in bed, he is agreeable to PT session. Pt performs bed mobility, transfers, and amb min A x1 for safety. Pt able to perform 2x STS followed by small bouts of 10 ft amb with RW. Pt able to demonstrate improvements with STS as seen by ability require less assist from PT. Pt continues to requires rest breaks in between sets due to poor endurance, although improving. Pt left seated EOB with NT at bedside to perform bathing. Overall, Pt demonstrates progression towards PT goals as seen by decrease assist and increase activity tolerance. Pt would benefit from skilled PT to address above deficits and promote optimal return to PLOF.   If plan is discharge home, recommend the following: Assist for transportation;Help with stairs or ramp for entrance;Assistance with cooking/housework;Two people to help with walking and/or transfers;Two people to help with bathing/dressing/bathroom   Can travel by private vehicle     No  Equipment Recommendations  None recommended by PT    Recommendations for Other Services       Precautions / Restrictions Precautions Precautions: Fall Restrictions Weight Bearing Restrictions: No     Mobility  Bed Mobility Overal bed mobility: Needs Assistance Bed Mobility: Supine to Sit     Supine to sit: Min assist     General bed mobility comments: min A for completion of task otherwise improving in initiation    Transfers Overall transfer level: Needs assistance Equipment used: Rolling walker (2 wheels) Transfers: Sit to/from Stand Sit to  Stand: Min assist           General transfer comment: able to perform x2 STS with cuing for hand placement and head over toes analogy to facilitate task    Ambulation/Gait Ambulation/Gait assistance: Min assist Gait Distance (Feet): 20 Feet Assistive device: Rolling walker (2 wheels) Gait Pattern/deviations: Step-through pattern, Decreased step length - left, Decreased step length - right, Decreased stride length Gait velocity: decreased     General Gait Details: able to amb approx 20 ft fw/bw using RW min A with ability to progress to occasional CGA   Stairs             Wheelchair Mobility     Tilt Bed    Modified Rankin (Stroke Patients Only)       Balance Overall balance assessment: Needs assistance Sitting-balance support: Feet supported, Single extremity supported Sitting balance-Leahy Scale: Good Sitting balance - Comments: able to maintain seated EOB balance during functional activities requiring CGA but able to progress to close sup A   Standing balance support: Single extremity supported, During functional activity Standing balance-Leahy Scale: Fair Standing balance comment: slight forward flex posture; reliance on AD; able to maintain static standing balance during functional activities                            Cognition Arousal: Alert Behavior During Therapy: WFL for tasks assessed/performed Overall Cognitive Status: Within Functional Limits for tasks assessed  General Comments: pleasant and motivated to participate in PT        Exercises      General Comments        Pertinent Vitals/Pain Pain Assessment Pain Assessment: No/denies pain    Home Living                          Prior Function            PT Goals (current goals can now be found in the care plan section) Acute Rehab PT Goals Patient Stated Goal: to get better PT Goal Formulation: With  patient/family Time For Goal Achievement: 01/13/23 Potential to Achieve Goals: Good Progress towards PT goals: Progressing toward goals    Frequency    Min 1X/week      PT Plan      Co-evaluation              AM-PAC PT "6 Clicks" Mobility   Outcome Measure  Help needed turning from your back to your side while in a flat bed without using bedrails?: A Little Help needed moving from lying on your back to sitting on the side of a flat bed without using bedrails?: A Little Help needed moving to and from a bed to a chair (including a wheelchair)?: A Little Help needed standing up from a chair using your arms (e.g., wheelchair or bedside chair)?: A Little Help needed to walk in hospital room?: A Lot Help needed climbing 3-5 steps with a railing? : Total 6 Click Score: 15    End of Session Equipment Utilized During Treatment: Gait belt Activity Tolerance: Patient limited by fatigue Patient left: in bed;with nursing/sitter in room;with call bell/phone within reach Nurse Communication: Mobility status PT Visit Diagnosis: Unsteadiness on feet (R26.81);Muscle weakness (generalized) (M62.81)     Time: 4098-1191 PT Time Calculation (min) (ACUTE ONLY): 23 min  Charges:                            Elmon Else, SPT    Jazell Rosenau 12/30/2022, 3:47 PM

## 2022-12-31 DIAGNOSIS — I5033 Acute on chronic diastolic (congestive) heart failure: Secondary | ICD-10-CM | POA: Diagnosis not present

## 2022-12-31 LAB — RENAL FUNCTION PANEL
Albumin: 3.2 g/dL — ABNORMAL LOW (ref 3.5–5.0)
Anion gap: 16 — ABNORMAL HIGH (ref 5–15)
BUN: 72 mg/dL — ABNORMAL HIGH (ref 8–23)
CO2: 23 mmol/L (ref 22–32)
Calcium: 8.1 mg/dL — ABNORMAL LOW (ref 8.9–10.3)
Chloride: 97 mmol/L — ABNORMAL LOW (ref 98–111)
Creatinine, Ser: 5.46 mg/dL — ABNORMAL HIGH (ref 0.61–1.24)
GFR, Estimated: 10 mL/min — ABNORMAL LOW (ref >60)
Glucose, Bld: 88 mg/dL (ref 70–99)
Phosphorus: 6.4 mg/dL — ABNORMAL HIGH (ref 2.5–4.6)
Potassium: 3.8 mmol/L (ref 3.5–5.1)
Sodium: 136 mmol/L (ref 135–145)

## 2022-12-31 LAB — CBC WITH DIFFERENTIAL/PLATELET
Abs Immature Granulocytes: 0.27 10*3/uL — ABNORMAL HIGH (ref 0.00–0.07)
Basophils Absolute: 0 10*3/uL (ref 0.0–0.1)
Basophils Relative: 0 %
Eosinophils Absolute: 0.3 10*3/uL (ref 0.0–0.5)
Eosinophils Relative: 3 %
HCT: 34.3 % — ABNORMAL LOW (ref 39.0–52.0)
Hemoglobin: 11.4 g/dL — ABNORMAL LOW (ref 13.0–17.0)
Immature Granulocytes: 3 %
Lymphocytes Relative: 8 %
Lymphs Abs: 0.7 10*3/uL (ref 0.7–4.0)
MCH: 29.9 pg (ref 26.0–34.0)
MCHC: 33.2 g/dL (ref 30.0–36.0)
MCV: 90 fL (ref 80.0–100.0)
Monocytes Absolute: 1.1 10*3/uL — ABNORMAL HIGH (ref 0.1–1.0)
Monocytes Relative: 11 %
Neutro Abs: 7.2 10*3/uL (ref 1.7–7.7)
Neutrophils Relative %: 75 %
Platelets: 216 10*3/uL (ref 150–400)
RBC: 3.81 MIL/uL — ABNORMAL LOW (ref 4.22–5.81)
RDW: 15.1 % (ref 11.5–15.5)
WBC: 9.6 10*3/uL (ref 4.0–10.5)
nRBC: 0 % (ref 0.0–0.2)

## 2022-12-31 LAB — GLUCOSE, CAPILLARY
Glucose-Capillary: 216 mg/dL — ABNORMAL HIGH (ref 70–99)
Glucose-Capillary: 191 mg/dL — ABNORMAL HIGH (ref 70–99)
Glucose-Capillary: 253 mg/dL — ABNORMAL HIGH (ref 70–99)

## 2022-12-31 LAB — BASIC METABOLIC PANEL
Anion gap: 16 — ABNORMAL HIGH (ref 5–15)
BUN: 74 mg/dL — ABNORMAL HIGH (ref 8–23)
CO2: 23 mmol/L (ref 22–32)
Calcium: 8.2 mg/dL — ABNORMAL LOW (ref 8.9–10.3)
Chloride: 98 mmol/L (ref 98–111)
Creatinine, Ser: 5.44 mg/dL — ABNORMAL HIGH (ref 0.61–1.24)
GFR, Estimated: 10 mL/min — ABNORMAL LOW (ref >60)
Glucose, Bld: 92 mg/dL (ref 70–99)
Potassium: 3.9 mmol/L (ref 3.5–5.1)
Sodium: 137 mmol/L (ref 135–145)

## 2022-12-31 MED ORDER — ALTEPLASE 2 MG IJ SOLR: 2.0000 mg | Freq: Once | INTRAMUSCULAR | Status: DC | PRN

## 2022-12-31 MED ORDER — HEPARIN SODIUM (PORCINE) 1000 UNIT/ML DIALYSIS: 25.0000 [IU]/kg | INTRAMUSCULAR | Status: DC | PRN

## 2022-12-31 MED ORDER — HEPARIN SODIUM (PORCINE) 1000 UNIT/ML DIALYSIS: 1000.0000 [IU] | INTRAMUSCULAR | Status: DC | PRN

## 2022-12-31 MED ORDER — HEPARIN SODIUM (PORCINE) 1000 UNIT/ML IJ SOLN
INTRAMUSCULAR | Status: AC
  Filled 2022-12-31: qty 10

## 2022-12-31 NOTE — Progress Notes (Signed)
Palliative Care Progress Note   Patient Name: Kyle Roberson       Date: 12/31/2022 DOB: 11/18/1940  Age: 82 y.o. MRN#: [id number] Attending Physician: Loyce Dys, MD Primary Care Physician: [name], MD Admit Date: 11/26/2022  PMT has been following patient peripherally. Please see previous PMT notes for GOC discussion details.   Chart reviewed.  Goals are clear for full code and full scope to remain.  Plan is for discharge to CIR.  No further palliative needs at this time.  Attending Dr. Meriam Sprague made aware and in agreement that PMT will sign off.  Please re-engage with PMT if goals change, at patient/family's request, or if patient's health deteriorates during hospitalization.   Thank you for allowing the Palliative Medicine Team to assist in the care of Kyle Roberson.  Samara Deist L. Bonita Quin, DNP, FNP-BC Palliative Medicine Team    No charge

## 2022-12-31 NOTE — Progress Notes (Signed)
[facility]  ROUNDING NOTE   Subjective:   Patient seen resting in bed No family present at bedside Alert and pleasant today Remains on room air No complaints to offer  Objective:  Vital signs in last 24 hours:  Temp:  [97.7 F (36.5 C)-97.9 F (36.6 C)] 97.7 F (36.5 C) (10/10 0924) Pulse Rate:  [60-71] 61 (10/10 0924) Resp:  [16-24] 16 (10/10 0924) BP: (128-155)/(52-57) 151/55 (10/10 0924) SpO2:  [96 %-100 %] 96 % (10/10 0924) Weight:  [93 kg] 93 kg (10/10 0500)  Weight change: -0.3 kg Filed Weights   12/29/22 0954 12/29/22 1319 12/31/22 0500  Weight: 93.3 kg 89.5 kg 93 kg    Intake/Output: I/O last 3 completed shifts: In: 120 [P.O.:120] Out: -    Intake/Output this shift:  No intake/output data recorded.  Physical Exam: General: NAD  Head: Normocephalic, atraumatic. Moist oral mucosal membranes  Eyes: Anicteric, ectropion  Lungs:  Clear to auscultation, room air  Heart: S1S2 no rubs  Abdomen:  Soft, nontender  Extremities: Trace peripheral edema.  Neurologic: Awake, alert  Skin: No lesions  Access: Rt Chest permcath    Basic Metabolic Panel: Recent Labs  Lab 12/26/22 1021 12/26/22 1512 12/27/22 0558 12/28/22 1036 12/29/22 0433 12/31/22 0603 12/31/22 0604  NA 134*   < > 137 138 138 136 137  K 3.2*   < > 3.6 3.7 4.0 3.8 3.9  CL 97*   < > 98 100 101 97* 98  CO2 27   < > 25 24 25 23 23   GLUCOSE 182*   < > 74 169* 116* 88 92  BUN 40*   < > 52* 75* 86* 72* 74*  CREATININE 2.38*   < > 3.20* 4.94* 5.68* 5.46* 5.44*  CALCIUM 8.0*   < > 8.0* 8.1* 8.4* 8.1* 8.2*  PHOS 2.3*  --  4.2 5.5* 6.7* 6.4*  --    < > = values in this interval not displayed.    Liver Function Tests: Recent Labs  Lab 12/26/22 1021 12/27/22 0558 12/28/22 1036 12/29/22 0433 12/31/22 0603  ALBUMIN 3.4* 3.1* 3.2* 3.5 3.2*   No results for input(s): "LIPASE", "AMYLASE" in the last 168 hours. No results for input(s): "AMMONIA" in the last 168  hours.  CBC: Recent Labs  Lab 12/26/22 1021 12/31/22 0604  WBC 10.8* 9.6  NEUTROABS  --  7.2  HGB 10.8* 11.4*  HCT 33.8* 34.3*  MCV 93.1 90.0  PLT 253 216    Cardiac Enzymes: No results for input(s): "CKTOTAL", "CKMB", "CKMBINDEX", "TROPONINI" in the last 168 hours.  BNP: Invalid input(s): "POCBNP"  CBG: Recent Labs  Lab 12/30/22 0811 12/30/22 1146 12/30/22 1633 12/30/22 2127 12/31/22 0938  GLUCAP 108* 286* 184* 91 216*    Microbiology: Results for orders placed or performed during the hospital encounter of 11/26/22  MRSA Next Gen by PCR, Nasal     Status: None   Collection Time: 11/30/22  4:10 PM   Specimen: Nasal Mucosa; Nasal Swab  Result Value Ref Range Status   MRSA by PCR Next Gen NOT DETECTED NOT DETECTED Final    Comment: (NOTE) The GeneXpert MRSA Assay (FDA approved for NASAL specimens only), is one component of a comprehensive MRSA colonization surveillance program. It is not intended to diagnose MRSA infection nor to guide or monitor treatment for MRSA infections. Test performance is not FDA approved in patients less than 67 years old. Performed at [facility], [address] [place], [place] [address]  Coagulation Studies: No results for input(s): "LABPROT", "INR" in the last 72 hours.    Urinalysis: No results for input(s): "COLORURINE", "LABSPEC", "PHURINE", "GLUCOSEU", "HGBUR", "BILIRUBINUR", "KETONESUR", "PROTEINUR", "UROBILINOGEN", "NITRITE", "LEUKOCYTESUR" in the last 72 hours.  Invalid input(s): "APPERANCEUR"    Imaging: No results found.   Medications:    sodium chloride      apixaban  2.5 mg Oral BID   vitamin C  500 mg Oral BID   Chlorhexidine Gluconate Cloth  6 each Topical Daily   cholecalciferol  2,000 Units Oral Daily   clotrimazole-betamethasone   Topical BID   docusate sodium  100 mg Oral BID   doxazosin  1 mg Oral BID   fluticasone furoate-vilanterol  1 puff Inhalation Daily   furosemide  80 mg  Oral Q M,W,F   hydrALAZINE  25 mg Oral TID   insulin aspart  0-15 Units Subcutaneous TID AC & HS   insulin aspart  3 Units Subcutaneous TID WC   insulin glargine-yfgn  20 Units Subcutaneous Daily   isosorbide mononitrate  60 mg Oral Daily   multivitamin  1 tablet Oral QHS   neomycin-polymyxin b-dexamethasone  1 Application Right Eye TID   omega-3 acid ethyl esters  1 g Oral Daily   pantoprazole  40 mg Oral Daily   rosuvastatin  10 mg Oral Once per day on Monday Wednesday Friday   sodium chloride flush  3 mL Intravenous Q12H   sodium chloride, acetaminophen **OR** acetaminophen, calcium carbonate, diphenhydrAMINE-zinc acetate, heparin sodium (porcine), hydrOXYzine, ipratropium-albuterol, morphine injection, nitroGLYCERIN, ondansetron **OR** ondansetron (ZOFRAN) IV, oxyCODONE, polyethylene glycol, simethicone, sodium chloride flush, traZODone  Assessment/ Plan:  Mr. Dago Jungwirth is a 82 y.o.  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 Due to progression of kidney disease and outpatient diuretic therapy failure, we feel this patient has progressed to end stage renal disease.  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 CRRT from 9/12 to 9/13 Will receive dialysis later today, UF goal 2.5 to 3 L as tolerated. Next treatment scheduled for Saturday. Outpatient clinic arranged at [facility] on TTS.  CIR discharge tentatively scheduled for Friday.  Acute exacerbation of chronic diastolic congestive heart failure: complicated with acute respiratory failure and pulmonary hypertension. -Dialysis and oral diuresis used to manage fluid  status. - Continue oral Furosemide 80mg  on non-HD days  Diabetes mellitus type II with chronic kidney disease - holding empagliflozin - Steroids during this admission -Glucose remains elevated at times.  4.  Anemia of chronic kidney disease. Will continue to assess need for EPO once below 10.  Hemoglobin remains acceptable, 11.4.  5.  Secondary hyperparathyroidism.  Continue to periodically monitor bone metabolism parameters.  Hyperphosphatemia noted.  Continue cholecalciferol.   LOS: 35 Marylene Masek 10/10/202412:13 PM

## 2022-12-31 NOTE — Progress Notes (Signed)
Occupational Therapy Treatment Patient Details Name: Kyle Roberson MRN: [id number] DOB: 1940/10/20 Today's Date: 12/31/2022   History of present illness Kyle Roberson is an 81yoM who comes to [facility] from OP nephrology on 9/5 due to LEE and ABD swelling. PMH: CKD, CHF. As of 9/9 he was transferred to ICU for IV milrinone gtt per [facility] heart failure MD. Started CRRT this admission and transitioned to HD.   OT comments  Kyle Roberson was seen for OT treatment on this date. Upon arrival to room pt seated in chair, reports recently completed PT session but agreeable to tx. Pt requires MOD A for sit<>stand, clears rear but states too fatigue fo fully upright posture. Performs seated therex as described below. Pt making good progress toward goals, will continue to follow POC. Discharge recommendation remains appropriate.      If plan is discharge home, recommend the following:  A lot of help with bathing/dressing/bathroom;Assistance with cooking/housework;Assist for transportation;Help with stairs or ramp for entrance;Two people to help with walking and/or transfers   Equipment Recommendations  BSC/3in1    Recommendations for Other Services      Precautions / Restrictions Precautions Precautions: Fall Restrictions Weight Bearing Restrictions: No       Mobility Bed Mobility               General bed mobility comments: not tested    Transfers Overall transfer level: Needs assistance Equipment used: None Transfers: Sit to/from Stand Sit to Stand: Mod assist           General transfer comment: clears rear, reports fatigue and returns to sitting     Balance Overall balance assessment: Needs assistance Sitting-balance support: Feet supported, Single extremity supported Sitting balance-Leahy Scale: Good                                     ADL either performed or assessed with clinical judgement   ADL Overall ADL's : Needs assistance/impaired                                        General ADL Comments: MOD A for LB access in sitting      Cognition Arousal: Alert Behavior During Therapy: WFL for tasks assessed/performed Overall Cognitive Status: Within Functional Limits for tasks assessed                                          Exercises Exercises: General Lower Extremity, General Upper Extremity General Exercises - Upper Extremity Chair Push Up: AROM, Strengthening, Both, 5 reps, Seated General Exercises - Lower Extremity Long Arc Quad: AROM, Strengthening, Both, 10 reps, Seated Hip ABduction/ADduction: AROM, Strengthening, Both, 10 reps, Seated Straight Leg Raises: AROM, Strengthening, Both, 10 reps, Seated Hip Flexion/Marching: AROM, Strengthening, Both, 10 reps, Seated Other Exercises Other Exercises: Seated chair sit ups and TA contractions for core strengthening            Pertinent Vitals/ Pain       Pain Assessment Pain Assessment: No/denies pain   Frequency  Min 1X/week        Progress Toward Goals  OT Goals(current goals can now be found in the care plan section)  Progress towards OT goals: Progressing toward goals  Acute Rehab OT Goals Patient Stated Goal: to get stronger OT Goal Formulation: With patient/family Time For Goal Achievement: 01/12/23 Potential to Achieve Goals: Good ADL Goals Pt Will Perform Lower Body Dressing: with caregiver independent in assisting;sit to/from stand Pt Will Transfer to Toilet: ambulating;bedside commode;with supervision Pt Will Perform Toileting - Clothing Manipulation and hygiene: with caregiver independent in assisting Additional ADL Goal #1: Pt will verbalize plan to implement at least 2 learned ECS/falls prevention strategies to maximize safety/indep with ADL and mobility.  Plan      Co-evaluation                 AM-PAC OT "6 Clicks" Daily Activity     Outcome Measure   Help from another person eating meals?:  None Help from another person taking care of personal grooming?: A Little Help from another person toileting, which includes using toliet, bedpan, or urinal?: A Lot Help from another person bathing (including washing, rinsing, drying)?: A Little Help from another person to put on and taking off regular upper body clothing?: A Little Help from another person to put on and taking off regular lower body clothing?: A Lot 6 Click Score: 17    End of Session    OT Visit Diagnosis: Other abnormalities of gait and mobility (R26.89);Muscle weakness (generalized) (M62.81);Pain   Activity Tolerance Patient tolerated treatment well   Patient Left in chair;with call bell/phone within reach;with chair alarm set   Nurse Communication          Time: 1055-1106 OT Time Calculation (min): 11 min  Charges: OT General Charges $OT Visit: 1 Visit OT Treatments $Therapeutic Exercise: 8-22 mins  Kathie Dike, M.S. OTR/L  12/31/22, 11:52 AM  ascom [phone]

## 2022-12-31 NOTE — Progress Notes (Signed)
Received patient in bed to unit.    Informed consent signed and in chart.    TX duration: 3hrs 20 min     Transported back to floor  Hand-off given to patient's nurse.   Access used:  rt chest CVC Access issues: n/a Total UF removed: 2300 mls Medication(s) given: n/a      Maple Hudson, RN Dialysis Unit

## 2022-12-31 NOTE — Progress Notes (Signed)
Inpatient Rehab Admissions Coordinator:   Planning for admit to CIR tomorrow.   Estill Dooms, PT, DPT Admissions Coordinator [phone] 12/31/22  9:55 AM

## 2022-12-31 NOTE — Progress Notes (Signed)
Progress Note   Patient: Kyle Roberson WUJ:[id number] DOB: Mar 20, 1941 DOA: 11/26/2022     35 DOS: the patient was seen and examined on 12/31/2022      Subjective:  Patient seen and examined this morning Denies nausea vomiting chest pain or any acute overnight events  Brief hospital course: 82yo with h/o CAD, stage 5 CKD, chronic diastolic CHF, BLE lymphedema, afib on Eliquis, PAH, and SDH presented on 09/05 with weight gain and SOB.     Diagnosed with acute on chronic CHF and started on Lasix drip.  Cardiology was consulted. Echo this admission with EF 55-60%, mild LVH, moderate RV enlargement with mildly decreased RV systolic function, PASP 73 mmHg, severe LAE, mild-moderate TR, IVC dilated.  IV midodrine drip 09/09.   RHC done on 09/11 with elevated R > L filling pressures, mostly right-sided, worsening renal function and decreased UOP so Lasix stopped.  Started on CRRT 09/12 for additional volume removal and completed this. 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  He is on IV Lasix.  Was on heparin gtt for Afib, has been bradycardic w/ ventricular rates 40-50s but go into 60-70 range on standing. Resumed Eliquis 09/24 Permcath placed 12/14/22 and now pending outpatient rehab placement and HD chair    Consultants:  Cardiology - advanced HF team  Nephrology  PCCM Palliative care Surgery PT/OT [facility] team   Procedures: Echocardiogram 9/6 CVC 9/12 RHC on 9/11 CRRT HD 9/17, 9/18, 09/20, 09/24 Permcath placed 09/23   Assessment and Plan: Acute on chronic diastolic congestive heart failure Pulmonary HTN, severe - group 2 PH (from elevated LVEDP  Presented with volume overload with increasing weight up to 255 pounds.  Dry weight around ~220 pounds Monitor input output and creatinine fluid management w/ dialysis and oral furosemide 80mg  daily on non-HD days  Overall poor prognosis given advanced renal disease.  Palliative discussed with him and his wife. Nephrologist on board Continue hemodialysis for fluid management   Acute hypoxic respiratory failure d/t volume overload / CHF/COPD Supplemental O2 to wean as able.   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 He is on TTS schedule. Dialysis following catheter placement 09/23 which became nonfunctional on 9/24 Overall very poor prognosis 9/25 permanent HD catheter was placed by vascular surgery. Continue HD per nephrology team. Outpatient clinic arranged at [facility] on TTS.  Plan of care discussed with nephrology team   COPD exacerbation Patient received a course of IV steroids and was transitioned to oral steroid and has completed 5 days therapy Continue to monitor saturation and respiratory function closely Continue Breo Ellipta, DuoNebs   CAD with remote PCI Continue statin, No ASA 9/30 resumed Imdur 60 mg p.o. daily, patient had an episode of chest pain at night on 9/29 which resolved after 2 doses of nitroglycerin. Cardiology following.   Atrial fibrillation: Permanent. Bradycardic currently Apixaban was held and patient was on heparin which was discontinued and apixaban 2.5 mg p.o. twice daily was resumed.      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     Hyperphosphatemia Binders per nephro   Hypertension Continue doxazosin, hydralazine   Hyperlipidemia Continue  rosuvastatin, Lovaza   Bilateral lower extremity lymphedema, chronic Chronic skin changes, dermatitis and possible fungal infection Continue Lotrisone cream twice daily   GERD  Continue pantoprazole 40 mg p.o. daily,Tums as well     Obesity Complicates overall prognosis Body mass index is 35.73 kg/m.  Diet, exercise and weight reduction advised.    Out of bed to chair. Incentive spirometry. Nursing supportive care. Fall, aspiration precautions. DVT prophylaxis: Eliquis   Code Status: Full Code    Family Communication: Discussed with patient's family       Disposition: Status is: Inpatient Remains inpatient appropriate because: HD, Inpatient rehab placement pending  Planned Discharge Destination: Rehab       Time spent: 38 minutes    Physical Exam:     General - Elderly obese  Caucasian male in no acute distress HEENT - PERRLA, EOMI, atraumatic head, non tender sinuses. Lung - Clear, bibasal rales, rhonchi, no wheezes. Heart - S1, S2 heard, no murmurs, rubs, 2+  pedal edema. Abdomen - Soft, non tender, non distended, bowel sounds good Neuro - Alert, awake and oriented x 3, non focal exam. Skin - Warm and dry. Chronic lower extremity skin changes, lymphedema   Data Reviewed: I have reviewed patient's lab results as shown below as well as PT OT documentation as well as nephrology documentation    Vitals:   12/31/22 1500 12/31/22 1510 12/31/22 1530 12/31/22 1600  BP: 127/66 137/72 128/63 122/70  Pulse: 65 72 63 69  Resp: 16 (!) 22 (!) 21 20  Temp:      TempSrc:      SpO2: 99% 99% 99% 99%  Weight:      Height:        Author: Loyce Dys, MD 12/31/2022 4:26 PM  For on call review [email or web address].

## 2022-12-31 NOTE — Plan of Care (Signed)
  Problem: Education: Goal: Ability to describe self-care measures that may prevent or decrease complications (Diabetes Survival Skills Education) will improve Outcome: Progressing Goal: Individualized Educational Video(s) Outcome: Progressing   Problem: Coping: Goal: Ability to adjust to condition or change in health will improve Outcome: Progressing   Problem: Fluid Volume: Goal: Ability to maintain a balanced intake and output will improve Outcome: Progressing   Problem: Health Behavior/Discharge Planning: Goal: Ability to identify and utilize available resources and services will improve Outcome: Progressing Goal: Ability to manage health-related needs will improve Outcome: Progressing   Problem: Metabolic: Goal: Ability to maintain appropriate glucose levels will improve Outcome: Progressing   Problem: Nutritional: Goal: Maintenance of adequate nutrition will improve Outcome: Progressing Goal: Progress toward achieving an optimal weight will improve Outcome: Progressing   Problem: Skin Integrity: Goal: Risk for impaired skin integrity will decrease Outcome: Progressing   Problem: Tissue Perfusion: Goal: Adequacy of tissue perfusion will improve Outcome: Progressing   Problem: Education: Goal: Knowledge of General Education information will improve Description: Including pain rating scale, medication(s)/side effects and non-pharmacologic comfort measures Outcome: Progressing   Problem: Health Behavior/Discharge Planning: Goal: Ability to manage health-related needs will improve Outcome: Progressing   Problem: Clinical Measurements: Goal: Ability to maintain clinical measurements within normal limits will improve Outcome: Progressing Goal: Will remain free from infection Outcome: Progressing Goal: Diagnostic test results will improve Outcome: Progressing Goal: Respiratory complications will improve Outcome: Progressing Goal: Cardiovascular complication will  be avoided Outcome: Progressing   Problem: Activity: Goal: Risk for activity intolerance will decrease Outcome: Progressing   Problem: Nutrition: Goal: Adequate nutrition will be maintained Outcome: Progressing   Problem: Coping: Goal: Level of anxiety will decrease Outcome: Progressing   Problem: Elimination: Goal: Will not experience complications related to bowel motility Outcome: Progressing Goal: Will not experience complications related to urinary retention Outcome: Progressing   Problem: Pain Managment: Goal: General experience of comfort will improve Outcome: Progressing   Problem: Safety: Goal: Ability to remain free from injury will improve Outcome: Progressing   Problem: Skin Integrity: Goal: Risk for impaired skin integrity will decrease Outcome: Progressing   Problem: Education: Goal: Understanding of CV disease, CV risk reduction, and recovery process will improve Outcome: Progressing Goal: Individualized Educational Video(s) Outcome: Progressing   Problem: Activity: Goal: Ability to return to baseline activity level will improve Outcome: Progressing   Problem: Cardiovascular: Goal: Ability to achieve and maintain adequate cardiovascular perfusion will improve Outcome: Progressing Goal: Vascular access site(s) Level 0-1 will be maintained Outcome: Progressing   Problem: Health Behavior/Discharge Planning: Goal: Ability to safely manage health-related needs after discharge will improve Outcome: Progressing   Problem: Education: Goal: Knowledge of disease and its progression will improve Outcome: Progressing   Problem: Fluid Volume: Goal: Compliance with measures to maintain balanced fluid volume will improve Outcome: Progressing   Problem: Health Behavior/Discharge Planning: Goal: Ability to manage health-related needs will improve Outcome: Progressing   Problem: Nutritional: Goal: Ability to make healthy dietary choices will  improve Outcome: Progressing   Problem: Clinical Measurements: Goal: Complications related to the disease process, condition or treatment will be avoided or minimized Outcome: Progressing

## 2022-12-31 NOTE — Progress Notes (Signed)
Physical Therapy Treatment Patient Details Name: Kyle Roberson MRN: [id number] DOB: June 01, 1940 Today's Date: 12/31/2022   History of Present Illness Maleki Aagard is an 81yoM who comes to [facility] from OP nephrology on 9/5 due to LEE and ABD swelling. PMH: CKD, CHF. As of 9/9 he was transferred to ICU for IV milrinone gtt per [facility] heart failure MD. Started CRRT this admission and transitioned to HD.    PT Comments  Pt was received in bed and agreed to PT session. Pt performed bed mobility to sit EOB MinA. With the use of RW (2wheels) pt stood [employer]. Once standing, CGA was necessary. Pt performed step transfer to recliner and sat down with CGA. Pt was brought into the hallway via recliner and amb 29ft in total prior to ending session back in bed. STS from recliner required MinA, amb required CGA with chair follow. Pt performed 3 STS in total for today's session and needed 1 rest break during amb prior to taking further steps. Pt tolerated Tx well and is making satisfactory progress. Pt continues to demonstrate motivation to participate in mobility and will continue to benefit from skilled PT sessions in order to further improve functional mobility following d/c.    If plan is discharge home, recommend the following: Assist for transportation;Help with stairs or ramp for entrance;Assistance with cooking/housework;Two people to help with walking and/or transfers;Two people to help with bathing/dressing/bathroom   Can travel by private vehicle     No  Equipment Recommendations  None recommended by PT    Recommendations for Other Services       Precautions / Restrictions Precautions Precautions: Fall Restrictions Weight Bearing Restrictions: No     Mobility  Bed Mobility Overal bed mobility: Needs Assistance Bed Mobility: Supine to Sit Rolling: Min assist, +2 for physical assistance Sidelying to sit: Min assist       General bed mobility comments: Pt performed bed mobility with  general MinA.    Transfers Overall transfer level: Needs assistance Equipment used: Rolling walker (2 wheels) Transfers: Sit to/from Stand Sit to Stand: Min assist   Step pivot transfers: Min assist       General transfer comment: Pt performed STS from EOB MinA. Once pt was standing, they performed step pivot with the use of RW (2wheels) CGA. Pt then sat into recliner with CGA.    Ambulation/Gait Ambulation/Gait assistance: Min assist Gait Distance (Feet): 50 Feet Assistive device: Rolling walker (2 wheels) Gait Pattern/deviations: Step-through pattern, Decreased step length - left, Decreased step length - right, Decreased stride length Gait velocity: decreased     General Gait Details: Pt was brought into hallway via recliner, where they then amb 7ft in total with RW (2wheels) CGA. MinA was necessary when performing STS, however once pt was standing CGA was necessary. STS required pt to use increased energy which resulted in a slow performance with increased effort. Pt completed a total of 3 STS. Pt needed 1 seated rest break while in hallway prior to continuing with amb.   Stairs             Wheelchair Mobility     Tilt Bed    Modified Rankin (Stroke Patients Only)       Balance Overall balance assessment: Needs assistance Sitting-balance support: Feet supported, Single extremity supported Sitting balance-Leahy Scale: Good Sitting balance - Comments: able to maintain seated EOB balance during functional activities requiring CGA but able to progress to close sup A Postural control: Posterior lean Standing balance support: Single  extremity supported, During functional activity Standing balance-Leahy Scale: Fair Standing balance comment: slight forward flex posture; reliance on AD; able to maintain static standing balance during functional activities                            Cognition Arousal: Alert Behavior During Therapy: WFL for tasks  assessed/performed Overall Cognitive Status: Within Functional Limits for tasks assessed                                 General Comments: pleasant and motivated to participate in PT        Exercises      General Comments        Pertinent Vitals/Pain Pain Assessment Pain Assessment: No/denies pain    Home Living                          Prior Function            PT Goals (current goals can now be found in the care plan section) Acute Rehab PT Goals Patient Stated Goal: to get better PT Goal Formulation: With patient/family Time For Goal Achievement: 01/13/23 Potential to Achieve Goals: Good Progress towards PT goals: Progressing toward goals    Frequency    Min 1X/week      PT Plan      Co-evaluation     PT goals addressed during session: Mobility/safety with mobility        AM-PAC PT "6 Clicks" Mobility   Outcome Measure  Help needed turning from your back to your side while in a flat bed without using bedrails?: A Little Help needed moving from lying on your back to sitting on the side of a flat bed without using bedrails?: A Little Help needed moving to and from a bed to a chair (including a wheelchair)?: A Little Help needed standing up from a chair using your arms (e.g., wheelchair or bedside chair)?: A Little Help needed to walk in hospital room?: A Lot Help needed climbing 3-5 steps with a railing? : Total 6 Click Score: 15    End of Session Equipment Utilized During Treatment: Gait belt Activity Tolerance: Patient limited by fatigue Patient left: in chair;with call bell/phone within reach;with chair alarm set Nurse Communication: Mobility status PT Visit Diagnosis: Unsteadiness on feet (R26.81);Muscle weakness (generalized) (M62.81)     Time: 1884-1660 PT Time Calculation (min) (ACUTE ONLY): 34 min  Charges:    $Gait Training: 23-37 mins PT General Charges $$ ACUTE PT VISIT: 1 Visit                      Astra Gregg Sauvignon Howard SPT, LAT, ATC    Talayeh Bruinsma Sauvignon-Howard 12/31/2022, 2:47 PM

## 2022-12-31 NOTE — Progress Notes (Signed)
Nutrition Follow-up  DOCUMENTATION CODES:   Obesity unspecified  INTERVENTION:   -Continue Nepro Shake po TID, each supplement provides 425 kcal and 19 grams protein -Continue Magic cup TID with meals, each supplement provides 290 kcal and 9 grams of protein -Continue Rena-vit po daily  -Continue Vitamin C 500 mg po BID -Continue daily weights   NUTRITION DIAGNOSIS:   Increased nutrient needs related to acute illness (CRRT) as evidenced by estimated needs.  Ongoing  GOAL:   Patient will meet greater than or equal to 90% of their needs  Progressing   MONITOR:   PO intake, Supplement acceptance, Labs, Weight trends, I & O's, Skin  REASON FOR ASSESSMENT:   Consult Diet education  ASSESSMENT:   82 y/o male with h/o CHF, DM, CKD IV, HTN, CAD, BPH, GERD, SDH s/p craniotomy, hernia s/p repair, Afib, lymphaedema, HLD, depression and pulmonary hypertension who is admitted with AKI and volume overload requiring CRRT.  9/12- CRRT initiated 9/14- CRRT d/c 9/16- iHD held due to signs of renal recovery 9/17- iHD 9/23- permacath placement 9/25- s/p dialysis/permacath repair  Reviewed I/O's: +120 ml x 24 hours and -8.1 L since 12/17/22  Per CIR admissions coordinator, plan to admit tomorrow.   Pt continues with good oral intake. Noted meal completions 50-100%. Pt is taking supplements.   Wt has been stable over the past week.   Medications reviewed and include vitamin C, vitamin D, and colace.   Labs reviewed: CBGS: 91-286 (inpatient orders for glycemic control are 0-15 units insulin aspart TID before meals and at bedtime, 3 units insulin aspart TID with meals, and 20 units insulin glargine-yfgn daily).    Diet Order:   Diet Order             Diet renal with fluid restriction Fluid consistency: Thin  Diet effective now                   EDUCATION NEEDS:   Education needs have been addressed  Skin:  Skin Assessment: Skin Integrity Issues: Skin Integrity  Issues:: Stage I Stage I: rt and lt buttocks  Last BM:  12/29/22  Height:   Ht Readings from Last 1 Encounters:  11/26/22 5\' 8"  (1.727 m)    Weight:   Wt Readings from Last 1 Encounters:  12/31/22 93 kg    Ideal Body Weight:  70 kg  BMI:  Body mass index is 31.17 kg/m.  Estimated Nutritional Needs:   Kcal:  2000-2300kcal/day  Protein:  100-115g/day  Fluid:  UOP +1L    Levada Schilling, RD, LDN, CDCES Registered Dietitian III Certified Diabetes Care and Education Specialist Please refer to [facility]ION for RD and/or RD on-call/weekend/after hours pager

## 2022-12-31 NOTE — Care Management Important Message (Signed)
Important Message  Patient Details  Name: Kyle Roberson MRN: [id number] Date of Birth: Sep 27, 1940   Important Message Given:  Yes - Medicare IM     Olegario Messier A Breyana Follansbee 12/31/2022, 1:41 PM

## 2023-01-01 ENCOUNTER — Encounter: Payer: Medicare HMO | Admitting: Internal Medicine

## 2023-01-01 DIAGNOSIS — I5033 Acute on chronic diastolic (congestive) heart failure: Secondary | ICD-10-CM | POA: Diagnosis not present

## 2023-01-01 LAB — BASIC METABOLIC PANEL
Anion gap: 15 (ref 5–15)
BUN: 43 mg/dL — ABNORMAL HIGH (ref 8–23)
CO2: 26 mmol/L (ref 22–32)
Calcium: 8.2 mg/dL — ABNORMAL LOW (ref 8.9–10.3)
Chloride: 96 mmol/L — ABNORMAL LOW (ref 98–111)
Creatinine, Ser: 3.71 mg/dL — ABNORMAL HIGH (ref 0.61–1.24)
GFR, Estimated: 15 mL/min — ABNORMAL LOW (ref >60)
Glucose, Bld: 82 mg/dL (ref 70–99)
Potassium: 3.7 mmol/L (ref 3.5–5.1)
Sodium: 137 mmol/L (ref 135–145)

## 2023-01-01 LAB — RENAL FUNCTION PANEL
Albumin: 3.3 g/dL — ABNORMAL LOW (ref 3.5–5.0)
Anion gap: 14 (ref 5–15)
BUN: 42 mg/dL — ABNORMAL HIGH (ref 8–23)
CO2: 27 mmol/L (ref 22–32)
Calcium: 8.2 mg/dL — ABNORMAL LOW (ref 8.9–10.3)
Chloride: 97 mmol/L — ABNORMAL LOW (ref 98–111)
Creatinine, Ser: 3.69 mg/dL — ABNORMAL HIGH (ref 0.61–1.24)
GFR, Estimated: 16 mL/min — ABNORMAL LOW (ref >60)
Glucose, Bld: 83 mg/dL (ref 70–99)
Phosphorus: 4.7 mg/dL — ABNORMAL HIGH (ref 2.5–4.6)
Potassium: 3.8 mmol/L (ref 3.5–5.1)
Sodium: 138 mmol/L (ref 135–145)

## 2023-01-01 LAB — GLUCOSE, CAPILLARY
Glucose-Capillary: 124 mg/dL — ABNORMAL HIGH (ref 70–99)
Glucose-Capillary: 208 mg/dL — ABNORMAL HIGH (ref 70–99)
Glucose-Capillary: 172 mg/dL — ABNORMAL HIGH (ref 70–99)
Glucose-Capillary: 197 mg/dL — ABNORMAL HIGH (ref 70–99)

## 2023-01-01 LAB — CBC WITH DIFFERENTIAL/PLATELET
Abs Immature Granulocytes: 0.21 10*3/uL — ABNORMAL HIGH (ref 0.00–0.07)
Basophils Absolute: 0 10*3/uL (ref 0.0–0.1)
Basophils Relative: 0 %
Eosinophils Absolute: 0.3 10*3/uL (ref 0.0–0.5)
Eosinophils Relative: 3 %
HCT: 34.9 % — ABNORMAL LOW (ref 39.0–52.0)
Hemoglobin: 11.3 g/dL — ABNORMAL LOW (ref 13.0–17.0)
Immature Granulocytes: 2 %
Lymphocytes Relative: 9 %
Lymphs Abs: 0.8 10*3/uL (ref 0.7–4.0)
MCH: 29.7 pg (ref 26.0–34.0)
MCHC: 32.4 g/dL (ref 30.0–36.0)
MCV: 91.6 fL (ref 80.0–100.0)
Monocytes Absolute: 1 10*3/uL (ref 0.1–1.0)
Monocytes Relative: 11 %
Neutro Abs: 7 10*3/uL (ref 1.7–7.7)
Neutrophils Relative %: 75 %
Platelets: 205 10*3/uL (ref 150–400)
RBC: 3.81 MIL/uL — ABNORMAL LOW (ref 4.22–5.81)
RDW: 15.2 % (ref 11.5–15.5)
WBC: 9.3 10*3/uL (ref 4.0–10.5)
nRBC: 0 % (ref 0.0–0.2)

## 2023-01-01 LAB — HEPATITIS B SURFACE ANTIGEN: Hepatitis B Surface Ag: NONREACTIVE

## 2023-01-01 MED ORDER — BISACODYL 10 MG RE SUPP
10.0000 mg | Freq: Every day | RECTAL | Status: DC | PRN
  Administered 2023-01-01: 10 mg via RECTAL
  Filled 2023-01-01: qty 1

## 2023-01-01 NOTE — Progress Notes (Signed)
Inpatient Rehab Admissions Coordinator:   Holding admit today.  Insurance reporting auth expired and requires new auth.  New request is pending.    Estill Dooms, PT, DPT Admissions Coordinator [phone] 01/01/23  2:35 PM

## 2023-01-01 NOTE — Progress Notes (Signed)
Physical Therapy Treatment Patient Details Name: Kyle Roberson MRN: [id number] DOB: 1940/10/07 Today's Date: 01/01/2023   History of Present Illness Burton Danziger is an 81yoM who comes to [facility] from OP nephrology on 9/5 due to LEE and ABD swelling. PMH: CKD, CHF. As of 9/9 he was transferred to ICU for IV milrinone gtt per [facility] heart failure MD. Started CRRT this admission and transitioned to HD.    PT Comments  Patient is making progress with increased activity tolerance this session. Patient ambulated 68ft with Mod A initially, progressing to Min A with increased ambulation distance using rolling walker. Patient continued to require assistance for bed mobility and transfers as well. Anticipate patient will need intensive rehabilitation >3 hours/day after this hospital stay. Supportive spouse at the bedside and will be able to provide assistance at home after rehab. PT will continue to follow.    If plan is discharge home, recommend the following: Assist for transportation;Help with stairs or ramp for entrance;Assistance with cooking/housework;Two people to help with walking and/or transfers;Two people to help with bathing/dressing/bathroom   Can travel by private vehicle     No  Equipment Recommendations  None recommended by PT    Recommendations for Other Services       Precautions / Restrictions Precautions Precautions: Fall Restrictions Weight Bearing Restrictions: No     Mobility  Bed Mobility Overal bed mobility: Needs Assistance Bed Mobility: Supine to Sit     Supine to sit: Mod assist, +2 for physical assistance     General bed mobility comments: verbal cues for taks initiation and sequencing. increased time and effort required    Transfers Overall transfer level: Needs assistance Equipment used: Rolling walker (2 wheels) Transfers: Sit to/from Stand Sit to Stand: Mod assist           General transfer comment: verbal cues for hand placement for standing.  2 standing bouts peformed. patient complains of right ankle "sprain" with mobility    Ambulation/Gait Ambulation/Gait assistance: Min assist, Mod assist Gait Distance (Feet): 45 Feet Assistive device: Rolling walker (2 wheels) Gait Pattern/deviations: Step-through pattern Gait velocity: decreased Gait velocity interpretation: <1.31 ft/sec, indicative of household ambulator   General Gait Details: increased steadying assistance initially with walking, progressing to Min A with increased ambulation distance. chair follow for safety, although not required. intermittent assistance for rolling walker negotiation with turns as well to avoid obstacles   Stairs             Wheelchair Mobility     Tilt Bed    Modified Rankin (Stroke Patients Only)       Balance Overall balance assessment: Needs assistance Sitting-balance support: Feet supported, Single extremity supported Sitting balance-Leahy Scale: Good     Standing balance support: Single extremity supported, During functional activity Standing balance-Leahy Scale: Fair Standing balance comment: external support required                            Cognition Arousal: Alert Behavior During Therapy: WFL for tasks assessed/performed Overall Cognitive Status: Within Functional Limits for tasks assessed                                          Exercises      General Comments        Pertinent Vitals/Pain Pain Assessment Pain Assessment: Faces Faces Pain Scale: Hurts  little more Pain Location: R ankle Pain Descriptors / Indicators: Discomfort Pain Intervention(s): Limited activity within patient's tolerance, Monitored during session, Repositioned    Home Living                          Prior Function            PT Goals (current goals can now be found in the care plan section) Acute Rehab PT Goals Patient Stated Goal: to get stronger PT Goal Formulation: With  patient/family Time For Goal Achievement: 01/13/23 Potential to Achieve Goals: Good Progress towards PT goals: Progressing toward goals    Frequency    Min 1X/week      PT Plan      Co-evaluation PT/OT/SLP Co-Evaluation/Treatment: Yes Reason for Co-Treatment: To address functional/ADL transfers PT goals addressed during session: Mobility/safety with mobility OT goals addressed during session: ADL's and self-care      AM-PAC PT "6 Clicks" Mobility   Outcome Measure  Help needed turning from your back to your side while in a flat bed without using bedrails?: A Little Help needed moving from lying on your back to sitting on the side of a flat bed without using bedrails?: A Little Help needed moving to and from a bed to a chair (including a wheelchair)?: A Little Help needed standing up from a chair using your arms (e.g., wheelchair or bedside chair)?: A Little Help needed to walk in hospital room?: A Lot Help needed climbing 3-5 steps with a railing? : Total 6 Click Score: 15    End of Session         PT Visit Diagnosis: Unsteadiness on feet (R26.81);Muscle weakness (generalized) (M62.81)     Time: 0865-7846 PT Time Calculation (min) (ACUTE ONLY): 23 min  Charges:    $Therapeutic Activity: 8-22 mins PT General Charges $$ ACUTE PT VISIT: 1 Visit                     Donna Bernard, PT, MPT    Ina Homes 01/01/2023, 11:16 AM

## 2023-01-01 NOTE — Progress Notes (Signed)
Progress Note   Patient: Kyle Roberson [id number]:[id number] DOB: [birth year] DOA: 11/26/2022     36 DOS: the patient was seen and examined on 01/01/2023      Subjective:  Patient seen and examined at bedside this morning in the presence of the wife Denied any acute overnight event I have discussed with CIR coordinator as well as transition of care manager  Brief hospital course: 82yo with h/o CAD, stage 5 CKD, chronic diastolic CHF, BLE lymphedema, afib on Eliquis, PAH, and SDH presented on 09/05 with weight gain and SOB.     Diagnosed with acute on chronic CHF and started on Lasix drip.  Cardiology was consulted. Echo this admission with EF 55-60%, mild LVH, moderate RV enlargement with mildly decreased RV systolic function, PASP 73 mmHg, severe LAE, mild-moderate TR, IVC dilated.  IV midodrine drip 09/09.   RHC done on 09/11 with elevated R > L filling pressures, mostly right-sided, worsening renal function and decreased UOP so Lasix stopped.  Started on CRRT 09/12 for additional volume removal and completed this. 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  He is on IV Lasix.  Was on heparin gtt for Afib, has been bradycardic w/ ventricular rates 40-50s but go into 60-70 range on standing. Resumed Eliquis 09/24 Permcath placed 12/14/22 and now pending outpatient rehab placement and HD chair    Consultants:  Cardiology - advanced HF team  Nephrology  PCCM Palliative care Surgery PT/OT [facility] team   Procedures: Echocardiogram 9/6 CVC 9/12 RHC on 9/11 CRRT HD 9/17, 9/18, 09/20, 09/24 Permcath placed 09/23   Assessment and Plan: Acute on chronic diastolic congestive heart failure Pulmonary HTN, severe - group 2 PH (from elevated LVEDP  Presented with volume overload with increasing weight up to 255 pounds.  Dry weight around ~220 pounds Monitor input output and creatinine fluid management w/ dialysis and oral  furosemide 80mg  daily on non-HD days  Overall poor prognosis given advanced renal disease. Palliative discussed with him and his wife. Nephrologist on board Continue hemodialysis for fluid management   Acute hypoxic respiratory failure d/t volume overload / CHF/COPD Supplemental O2 to wean as able.   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 He is on TTS schedule. Dialysis following catheter placement 09/23 which became nonfunctional on 9/24 Overall very poor prognosis 9/25 permanent HD catheter was placed by vascular surgery. Continue HD per nephrology team. Outpatient clinic arranged at [facility] on TTS.  Plan of care discussed with nephrology team    COPD exacerbation Patient received a course of IV steroids and was transitioned to oral steroid and has completed 5 days therapy Continue to monitor saturation and respiratory function closely Continue Breo Ellipta, DuoNebs   CAD with remote PCI Continue statin, No ASA 9/30 resumed Imdur 60 mg p.o. daily, patient had an episode of chest pain at night on 9/29 which resolved after 2 doses of nitroglycerin. Cardiology following.   Atrial fibrillation: Permanent. Bradycardic currently Apixaban was held and patient was on heparin which was discontinued and apixaban 2.5 mg p.o. twice daily was resumed.       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  Hyperphosphatemia Binders per nephro   Hypertension Continue doxazosin, hydralazine   Hyperlipidemia Continue rosuvastatin, Lovaza   Bilateral lower extremity lymphedema, chronic Chronic skin changes, dermatitis and possible fungal infection Continue  Lotrisone cream twice daily   GERD  Continue pantoprazole 40 mg p.o. daily,Tums as well    Obesity Complicates overall prognosis Body mass index is 35.73 kg/m.  Diet, exercise and weight reduction advised.     Out of bed to chair. Incentive spirometry. Nursing supportive care. Fall, aspiration precautions. DVT prophylaxis: Eliquis   Code Status: Full Code    Family Communication: Discussed with patient's family       Disposition: Status is: Inpatient Remains inpatient appropriate because: HD, Inpatient rehab placement pending  Planned Discharge Destination: [employer] authorization has expired and this has been reapplied for We will continue discussion with transition of care manager   Time spent: 35 minutes     Physical Exam:     General - Elderly obese  Caucasian male in no acute distress HEENT - PERRLA, EOMI, atraumatic head, non tender sinuses. Lung - Clear, bibasal rales, rhonchi, no wheezes. Heart - S1, S2 heard, no murmurs, rubs, 2+  pedal edema. Abdomen - Soft, non tender, non distended, bowel sounds good Neuro - Alert, awake and oriented x 3, non focal exam. Skin - Warm and dry. Chronic lower extremity skin changes, lymphedema   Data Reviewed: I have reviewed patient's lab today, rehab admission coordinator documentation, PT OT documentation, nursing documentation as well as transition of care manager documentation    Latest Ref Rng & Units 01/01/2023    6:20 AM 12/31/2022    6:04 AM 12/26/2022   10:21 AM  CBC  WBC 4.0 - 10.5 K/uL 9.3  9.6  10.8   Hemoglobin 13.0 - 17.0 g/dL 42.5  95.6  38.7   Hematocrit 39.0 - 52.0 % 34.9  34.3  33.8   Platelets 150 - 400 K/uL 205  216  253        Latest Ref Rng & Units 01/01/2023    6:21 AM 01/01/2023    6:20 AM 12/31/2022    6:04 AM  BMP  Glucose 70 - 99 mg/dL 83  82  92   BUN 8 - 23 mg/dL 42  43  74   Creatinine 0.61 - 1.24 mg/dL 5.64  3.32  9.51   Sodium 135 - 145 mmol/L 138  137  137   Potassium  3.5 - 5.1 mmol/L 3.8  3.7  3.9   Chloride 98 - 111 mmol/L 97  96  98   CO2 22 - 32 mmol/L 27  26  23    Calcium 8.9 - 10.3 mg/dL 8.2  8.2  8.2     Vitals:   01/01/23 0421 01/01/23 0942 01/01/23 0943 01/01/23 1540  BP: (!) 146/62 (!) 121/49 (!) 121/49 (!) 125/51  Pulse: 60   71  Resp: 16   18  Temp: (!) 97.4 F (36.3 C)   98 F (36.7 C)  TempSrc: Oral     SpO2: 98%   100%  Weight:      Height:         Author: Loyce Dys, MD 01/01/2023 5:00 PM  For on call review [email or web address].

## 2023-01-01 NOTE — Progress Notes (Signed)
[facility]  ROUNDING NOTE   Subjective:   Patient seen laying in bed Wife at bedside Denies pain or discomfort  Dialysis yesterday, UF 2.3L  Objective:  Vital signs in last 24 hours:  Temp:  [97.4 F (36.3 C)-98.3 F (36.8 C)] 97.4 F (36.3 C) (10/11 0421) Pulse Rate:  [60-72] 60 (10/11 0421) Resp:  [14-22] 16 (10/11 0421) BP: (92-146)/(49-72) 121/49 (10/11 0943) SpO2:  [96 %-100 %] 98 % (10/11 0421) Weight:  [90 kg] 90 kg (10/11 0348)  Weight change: -3 kg Filed Weights   12/29/22 1319 12/31/22 0500 01/01/23 0348  Weight: 89.5 kg 93 kg 90 kg    Intake/Output: I/O last 3 completed shifts: In: -  Out: 2300 [Other:2300]   Intake/Output this shift:  Total I/O In: 360 [P.O.:360] Out: -   Physical Exam: General: NAD  Head: Normocephalic, atraumatic. Moist oral mucosal membranes  Eyes: Anicteric, ectropion  Lungs:  Clear to auscultation, room air  Heart: S1S2 no rubs  Abdomen:  Soft, nontender  Extremities: Trace peripheral edema.  Neurologic: Awake, alert  Skin: No lesions  Access: Rt Chest permcath    Basic Metabolic Panel: Recent Labs  Lab 12/27/22 0558 12/28/22 1036 12/29/22 0433 12/31/22 0603 12/31/22 0604 01/01/23 0620 01/01/23 0621  NA 137 138 138 136 137 137 138  K 3.6 3.7 4.0 3.8 3.9 3.7 3.8  CL 98 100 101 97* 98 96* 97*  CO2 25 24 25 23 23 26 27   GLUCOSE 74 169* 116* 88 92 82 83  BUN 52* 75* 86* 72* 74* 43* 42*  CREATININE 3.20* 4.94* 5.68* 5.46* 5.44* 3.71* 3.69*  CALCIUM 8.0* 8.1* 8.4* 8.1* 8.2* 8.2* 8.2*  PHOS 4.2 5.5* 6.7* 6.4*  --   --  4.7*    Liver Function Tests: Recent Labs  Lab 12/27/22 0558 12/28/22 1036 12/29/22 0433 12/31/22 0603 01/01/23 0621  ALBUMIN 3.1* 3.2* 3.5 3.2* 3.3*   No results for input(s): "LIPASE", "AMYLASE" in the last 168 hours. No results for input(s): "AMMONIA" in the last 168 hours.  CBC: Recent Labs  Lab 12/26/22 1021 12/31/22 0604 01/01/23 0620  WBC 10.8* 9.6 9.3   NEUTROABS  --  7.2 7.0  HGB 10.8* 11.4* 11.3*  HCT 33.8* 34.3* 34.9*  MCV 93.1 90.0 91.6  PLT 253 216 205    Cardiac Enzymes: No results for input(s): "CKTOTAL", "CKMB", "CKMBINDEX", "TROPONINI" in the last 168 hours.  BNP: Invalid input(s): "POCBNP"  CBG: Recent Labs  Lab 12/30/22 2127 12/31/22 0938 12/31/22 1211 12/31/22 2137 01/01/23 0935  GLUCAP 91 216* 191* 253* 124*    Microbiology: Results for orders placed or performed during the hospital encounter of 11/26/22  MRSA Next Gen by PCR, Nasal     Status: None   Collection Time: 11/30/22  4:10 PM   Specimen: Nasal Mucosa; Nasal Swab  Result Value Ref Range Status   MRSA by PCR Next Gen NOT DETECTED NOT DETECTED Final    Comment: (NOTE) The GeneXpert MRSA Assay (FDA approved for NASAL specimens only), is one component of a comprehensive MRSA colonization surveillance program. It is not intended to diagnose MRSA infection nor to guide or monitor treatment for MRSA infections. Test performance is not FDA approved in patients less than 45 years old. Performed at [facility], [address] [place], [place] [address]     Coagulation Studies: No results for input(s): "LABPROT", "INR" in the last 72 hours.    Urinalysis: No results for input(s): "COLORURINE", "LABSPEC", "PHURINE", "  GLUCOSEU", "HGBUR", "BILIRUBINUR", "KETONESUR", "PROTEINUR", "UROBILINOGEN", "NITRITE", "LEUKOCYTESUR" in the last 72 hours.  Invalid input(s): "APPERANCEUR"    Imaging: No results found.   Medications:    sodium chloride      apixaban  2.5 mg Oral BID   vitamin C  500 mg Oral BID   Chlorhexidine Gluconate Cloth  6 each Topical Daily   cholecalciferol  2,000 Units Oral Daily   clotrimazole-betamethasone   Topical BID   docusate sodium  100 mg Oral BID   doxazosin  1 mg Oral BID   fluticasone furoate-vilanterol  1 puff Inhalation Daily   furosemide  80 mg Oral Q M,W,F   hydrALAZINE  25 mg Oral TID   insulin  aspart  0-15 Units Subcutaneous TID AC & HS   insulin aspart  3 Units Subcutaneous TID WC   insulin glargine-yfgn  20 Units Subcutaneous Daily   isosorbide mononitrate  60 mg Oral Daily   multivitamin  1 tablet Oral QHS   neomycin-polymyxin b-dexamethasone  1 Application Right Eye TID   omega-3 acid ethyl esters  1 g Oral Daily   pantoprazole  40 mg Oral Daily   rosuvastatin  10 mg Oral Once per day on Monday Wednesday Friday   sodium chloride flush  3 mL Intravenous Q12H   sodium chloride, acetaminophen **OR** acetaminophen, calcium carbonate, diphenhydrAMINE-zinc acetate, heparin sodium (porcine), hydrOXYzine, ipratropium-albuterol, morphine injection, nitroGLYCERIN, ondansetron **OR** ondansetron (ZOFRAN) IV, oxyCODONE, polyethylene glycol, simethicone, sodium chloride flush, traZODone  Assessment/ Plan:  [name] is a 82 y.o.  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 Due to progression of kidney disease and outpatient diuretic therapy failure, we feel this patient has progressed to end stage renal disease.  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 CRRT from 9/12 to 9/13 Dialysis received yesterday, UF 2.3L achieved. Patient volume status stabilizing.  Outpatient clinic arranged at [facility] on TTS.  CIR discharge later today.  Acute exacerbation of chronic diastolic congestive heart failure: complicated with acute respiratory failure and pulmonary hypertension. -Dialysis and oral diuresis used to manage fluid status. - Continue oral Furosemide 80mg  on non-HD days  Diabetes mellitus type II with chronic  kidney disease - holding empagliflozin - Steroids during this admission   4.  Anemia of chronic kidney disease. Will continue to assess need for EPO once below 10.  Hemoglobin 11.3.  5.  Secondary hyperparathyroidism.  Continue to periodically monitor bone metabolism parameters.  Hyperphosphatemia noted.  Continue cholecalciferol.   LOS: 36 Bary Limbach 10/11/202412:42 PM

## 2023-01-01 NOTE — Plan of Care (Signed)
  Problem: Education: Goal: Ability to describe self-care measures that may prevent or decrease complications (Diabetes Survival Skills Education) will improve Outcome: Progressing Goal: Individualized Educational Video(s) Outcome: Progressing   Problem: Coping: Goal: Ability to adjust to condition or change in health will improve Outcome: Progressing   Problem: Fluid Volume: Goal: Ability to maintain a balanced intake and output will improve Outcome: Progressing   Problem: Health Behavior/Discharge Planning: Goal: Ability to identify and utilize available resources and services will improve Outcome: Progressing Goal: Ability to manage health-related needs will improve Outcome: Progressing   Problem: Metabolic: Goal: Ability to maintain appropriate glucose levels will improve Outcome: Progressing   Problem: Nutritional: Goal: Maintenance of adequate nutrition will improve Outcome: Progressing Goal: Progress toward achieving an optimal weight will improve Outcome: Progressing   Problem: Skin Integrity: Goal: Risk for impaired skin integrity will decrease Outcome: Progressing   Problem: Tissue Perfusion: Goal: Adequacy of tissue perfusion will improve Outcome: Progressing   Problem: Education: Goal: Knowledge of General Education information will improve Description: Including pain rating scale, medication(s)/side effects and non-pharmacologic comfort measures Outcome: Progressing   Problem: Health Behavior/Discharge Planning: Goal: Ability to manage health-related needs will improve Outcome: Progressing   Problem: Clinical Measurements: Goal: Ability to maintain clinical measurements within normal limits will improve Outcome: Progressing Goal: Will remain free from infection Outcome: Progressing Goal: Diagnostic test results will improve Outcome: Progressing Goal: Respiratory complications will improve Outcome: Progressing Goal: Cardiovascular complication will  be avoided Outcome: Progressing   Problem: Activity: Goal: Risk for activity intolerance will decrease Outcome: Progressing   Problem: Nutrition: Goal: Adequate nutrition will be maintained Outcome: Progressing   Problem: Coping: Goal: Level of anxiety will decrease Outcome: Progressing   Problem: Elimination: Goal: Will not experience complications related to bowel motility Outcome: Progressing Goal: Will not experience complications related to urinary retention Outcome: Progressing   Problem: Pain Managment: Goal: General experience of comfort will improve Outcome: Progressing   Problem: Safety: Goal: Ability to remain free from injury will improve Outcome: Progressing   Problem: Skin Integrity: Goal: Risk for impaired skin integrity will decrease Outcome: Progressing   Problem: Education: Goal: Understanding of CV disease, CV risk reduction, and recovery process will improve Outcome: Progressing Goal: Individualized Educational Video(s) Outcome: Progressing   Problem: Activity: Goal: Ability to return to baseline activity level will improve Outcome: Progressing   Problem: Cardiovascular: Goal: Ability to achieve and maintain adequate cardiovascular perfusion will improve Outcome: Progressing Goal: Vascular access site(s) Level 0-1 will be maintained Outcome: Progressing   Problem: Health Behavior/Discharge Planning: Goal: Ability to safely manage health-related needs after discharge will improve Outcome: Progressing   Problem: Education: Goal: Knowledge of disease and its progression will improve Outcome: Progressing   Problem: Fluid Volume: Goal: Compliance with measures to maintain balanced fluid volume will improve Outcome: Progressing   Problem: Health Behavior/Discharge Planning: Goal: Ability to manage health-related needs will improve Outcome: Progressing   Problem: Nutritional: Goal: Ability to make healthy dietary choices will  improve Outcome: Progressing   Problem: Clinical Measurements: Goal: Complications related to the disease process, condition or treatment will be avoided or minimized Outcome: Progressing

## 2023-01-01 NOTE — Progress Notes (Signed)
Inpatient Rehab Admissions Coordinator:   I have a bed to admit this patient to CIR today.  TOC aware.  Will notify family.  I will arrange carelink pick up once I have a room assignment.   Estill Dooms, PT, DPT Admissions Coordinator [phone] 01/01/23  10:03 AM

## 2023-01-01 NOTE — Progress Notes (Signed)
Occupational Therapy Treatment Patient Details Name: Kyle Roberson MRN: [id number] DOB: November 10, 1940 Today's Date: 01/01/2023   History of present illness Kyle Roberson is an 81yoM who comes to [facility] from OP nephrology on 9/5 due to LEE and ABD swelling. PMH: CKD, CHF. As of 9/9 he was transferred to ICU for IV milrinone gtt per [facility] heart failure MD. Started CRRT this admission and transitioned to HD.   OT comments  Kyle Roberson was seen for OT/PT co-treatment on this date. Upon arrival to room pt reclined in bed, agreeable to tx. Pt requires MOD A x2 exit bed, good sitting balance. MOD A x2 + RW sit<>stand at bed, improves to MIN A + RW for ADL t/f ~45 ft, +2 for chair follow. Pt limited by R ankle pain. Left up in chair. Pt making good progress toward goals, will continue to follow POC. Discharge recommendation remains appropriate.        If plan is discharge home, recommend the following:  A lot of help with bathing/dressing/bathroom;Assistance with cooking/housework;Assist for transportation;Help with stairs or ramp for entrance;Two people to help with walking and/or transfers   Equipment Recommendations  BSC/3in1    Recommendations for Other Services      Precautions / Restrictions Precautions Precautions: Fall Restrictions Weight Bearing Restrictions: No       Mobility Bed Mobility Overal bed mobility: Needs Assistance Bed Mobility: Supine to Sit     Supine to sit: Mod assist, +2 for physical assistance          Transfers Overall transfer level: Needs assistance Equipment used: Rolling walker (2 wheels) Transfers: Sit to/from Stand Sit to Stand: Min assist, Mod assist, +2 physical assistance     Step pivot transfers: Min assist           Balance Overall balance assessment: Needs assistance Sitting-balance support: Feet supported, Single extremity supported Sitting balance-Leahy Scale: Good     Standing balance support: Bilateral upper extremity  supported, Reliant on assistive device for balance Standing balance-Leahy Scale: Fair                             ADL either performed or assessed with clinical judgement   ADL Overall ADL's : Needs assistance/impaired                                       General ADL Comments: MOD A + RW for BSC t/f      Cognition Arousal: Alert Behavior During Therapy: WFL for tasks assessed/performed Overall Cognitive Status: Within Functional Limits for tasks assessed                                           Pertinent Vitals/ Pain       Pain Assessment Pain Assessment: Faces Faces Pain Scale: Hurts little more Pain Location: R ankle Pain Descriptors / Indicators: Discomfort, Grimacing Pain Intervention(s): Limited activity within patient's tolerance   Frequency  Min 1X/week        Progress Toward Goals  OT Goals(current goals can now be found in the care plan section)  Progress towards OT goals: Progressing toward goals  Acute Rehab OT Goals Patient Stated Goal: to go home OT Goal Formulation: With patient/family Time For Goal Achievement: 01/12/23 Potential  to Achieve Goals: Good ADL Goals Pt Will Perform Lower Body Dressing: with caregiver independent in assisting;sit to/from stand Pt Will Transfer to Toilet: ambulating;bedside commode;with supervision Pt Will Perform Toileting - Clothing Manipulation and hygiene: with caregiver independent in assisting Additional ADL Goal #1: Pt will verbalize plan to implement at least 2 learned ECS/falls prevention strategies to maximize safety/indep with ADL and mobility.  Plan      Co-evaluation    PT/OT/SLP Co-Evaluation/Treatment: Yes Reason for Co-Treatment: To address functional/ADL transfers PT goals addressed during session: Mobility/safety with mobility OT goals addressed during session: ADL's and self-care      AM-PAC OT "6 Clicks" Daily Activity     Outcome Measure    Help from another person eating meals?: None Help from another person taking care of personal grooming?: A Little Help from another person toileting, which includes using toliet, bedpan, or urinal?: A Lot Help from another person bathing (including washing, rinsing, drying)?: A Little Help from another person to put on and taking off regular upper body clothing?: A Little Help from another person to put on and taking off regular lower body clothing?: A Lot 6 Click Score: 17    End of Session Equipment Utilized During Treatment: Rolling walker (2 wheels)  OT Visit Diagnosis: Other abnormalities of gait and mobility (R26.89);Muscle weakness (generalized) (M62.81);Pain   Activity Tolerance Patient tolerated treatment well   Patient Left in chair;with call bell/phone within reach;with family/visitor present   Nurse Communication          Time: 8469-6295 OT Time Calculation (min): 23 min  Charges: OT General Charges $OT Visit: 1 Visit OT Treatments $Self Care/Home Management : 8-22 mins  Kathie Dike, M.S. OTR/L  01/01/23, 11:16 AM  ascom [phone]

## 2023-01-02 DIAGNOSIS — I5033 Acute on chronic diastolic (congestive) heart failure: Secondary | ICD-10-CM | POA: Diagnosis not present

## 2023-01-02 LAB — BASIC METABOLIC PANEL
Anion gap: 17 — ABNORMAL HIGH (ref 5–15)
BUN: 67 mg/dL — ABNORMAL HIGH (ref 8–23)
CO2: 23 mmol/L (ref 22–32)
Calcium: 8 mg/dL — ABNORMAL LOW (ref 8.9–10.3)
Chloride: 98 mmol/L (ref 98–111)
Creatinine, Ser: 5.23 mg/dL — ABNORMAL HIGH (ref 0.61–1.24)
GFR, Estimated: 10 mL/min — ABNORMAL LOW (ref >60)
Glucose, Bld: 113 mg/dL — ABNORMAL HIGH (ref 70–99)
Potassium: 3.9 mmol/L (ref 3.5–5.1)
Sodium: 138 mmol/L (ref 135–145)

## 2023-01-02 LAB — CBC WITH DIFFERENTIAL/PLATELET
Abs Immature Granulocytes: 0.2 10*3/uL — ABNORMAL HIGH (ref 0.00–0.07)
Basophils Absolute: 0 10*3/uL (ref 0.0–0.1)
Basophils Relative: 0 %
Eosinophils Absolute: 0.4 10*3/uL (ref 0.0–0.5)
Eosinophils Relative: 4 %
HCT: 32.7 % — ABNORMAL LOW (ref 39.0–52.0)
Hemoglobin: 10.9 g/dL — ABNORMAL LOW (ref 13.0–17.0)
Immature Granulocytes: 2 %
Lymphocytes Relative: 9 %
Lymphs Abs: 0.8 10*3/uL (ref 0.7–4.0)
MCH: 29.9 pg (ref 26.0–34.0)
MCHC: 33.3 g/dL (ref 30.0–36.0)
MCV: 89.8 fL (ref 80.0–100.0)
Monocytes Absolute: 1.2 10*3/uL — ABNORMAL HIGH (ref 0.1–1.0)
Monocytes Relative: 12 %
Neutro Abs: 7.1 10*3/uL (ref 1.7–7.7)
Neutrophils Relative %: 73 %
Platelets: 199 10*3/uL (ref 150–400)
RBC: 3.64 MIL/uL — ABNORMAL LOW (ref 4.22–5.81)
RDW: 15 % (ref 11.5–15.5)
WBC: 9.7 10*3/uL (ref 4.0–10.5)
nRBC: 0 % (ref 0.0–0.2)

## 2023-01-02 LAB — GLUCOSE, CAPILLARY
Glucose-Capillary: 112 mg/dL — ABNORMAL HIGH (ref 70–99)
Glucose-Capillary: 107 mg/dL — ABNORMAL HIGH (ref 70–99)
Glucose-Capillary: 261 mg/dL — ABNORMAL HIGH (ref 70–99)

## 2023-01-02 LAB — RENAL FUNCTION PANEL
Albumin: 3.1 g/dL — ABNORMAL LOW (ref 3.5–5.0)
Anion gap: 15 (ref 5–15)
BUN: 65 mg/dL — ABNORMAL HIGH (ref 8–23)
CO2: 23 mmol/L (ref 22–32)
Calcium: 7.9 mg/dL — ABNORMAL LOW (ref 8.9–10.3)
Chloride: 98 mmol/L (ref 98–111)
Creatinine, Ser: 5.32 mg/dL — ABNORMAL HIGH (ref 0.61–1.24)
GFR, Estimated: 10 mL/min — ABNORMAL LOW (ref >60)
Glucose, Bld: 113 mg/dL — ABNORMAL HIGH (ref 70–99)
Phosphorus: 6.1 mg/dL — ABNORMAL HIGH (ref 2.5–4.6)
Potassium: 3.8 mmol/L (ref 3.5–5.1)
Sodium: 136 mmol/L (ref 135–145)

## 2023-01-02 NOTE — Progress Notes (Signed)
Progress Note   Patient: Kyle Roberson ZOX:[id number] DOB: 07-18-1940 DOA: 11/26/2022     37 DOS: the patient was seen and examined on 01/02/2023        Subjective:  Patient seen and examined at bedside this morning while undergoing hemodialysis He appeared frustrated as insurance approval is taking time He denied nausea vomiting abdominal pain or chest pain  Brief hospital course: 81yo with h/o CAD, stage 5 CKD, chronic diastolic CHF, BLE lymphedema, afib on Eliquis, PAH, and SDH presented on 09/05 with weight gain and SOB.     Diagnosed with acute on chronic CHF and started on Lasix drip.  Cardiology was consulted. Echo this admission with EF 55-60%, mild LVH, moderate RV enlargement with mildly decreased RV systolic function, PASP 73 mmHg, severe LAE, mild-moderate TR, IVC dilated.  IV midodrine drip 09/09.   RHC done on 09/11 with elevated R > L filling pressures, mostly right-sided, worsening renal function and decreased UOP so Lasix stopped.  Started on CRRT 09/12 for additional volume removal and completed this. 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  He is on IV Lasix.  Was on heparin gtt for Afib, has been bradycardic w/ ventricular rates 40-50s but go into 60-70 range on standing. Resumed Eliquis 09/24 Permcath placed 12/14/22 and now pending outpatient rehab placement and HD chair    Consultants:  Cardiology - advanced HF team  Nephrology  PCCM Palliative care Surgery PT/OT [facility] team   Procedures: Echocardiogram 9/6 CVC 9/12 RHC on 9/11 CRRT HD 9/17, 9/18, 09/20, 09/24 Permcath placed 09/23   Assessment and Plan: Acute on chronic diastolic congestive heart failure Pulmonary HTN, severe - group 2 PH (from elevated LVEDP  Presented with volume overload with increasing weight up to 255 pounds.  Dry weight around ~220 pounds Monitor input output and creatinine fluid management w/ dialysis and  oral furosemide 80mg  daily on non-HD days  Overall poor prognosis given advanced renal disease. Palliative discussed with him and his wife. Continue hemodialysis Plan of care discussed with nephrologist Still awaiting placement in acute rehab   Acute hypoxic respiratory failure d/t volume overload / CHF/COPD Supplemental O2 to wean as able.   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 He is on TTS schedule. Dialysis following catheter placement 09/23 which became nonfunctional on 9/24 Overall very poor prognosis 9/25 permanent HD catheter was placed by vascular surgery. Continue HD per nephrology team. Outpatient clinic arranged at [facility] on TTS.  Plan of care discussed with nephrology team     COPD exacerbation Patient received a course of IV steroids and was transitioned to oral steroid and has completed 5 days therapy Continue to monitor saturation and respiratory function closely Continue Breo Ellipta, DuoNebs   CAD with remote PCI Continue statin, No ASA 9/30 resumed Imdur 60 mg p.o. daily, patient had an episode of chest pain at night on 9/29 which resolved after 2 doses of nitroglycerin. Cardiology following.   Atrial fibrillation: Permanent. Bradycardic currently Apixaban was held and patient was on heparin which was discontinued and apixaban 2.5 mg p.o. twice daily was resumed.       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     Hyperphosphatemia Continue binders per nephro   Hypertension Continue doxazosin, hydralazine   Hyperlipidemia Continue rosuvastatin, Lovaza   Bilateral lower extremity lymphedema, chronic Chronic skin changes,  dermatitis and possible fungal infection Continue Lotrisone cream twice daily   GERD  Continue pantoprazole 40 mg p.o. daily,Tums as well    Obesity Complicates overall prognosis Body mass index is 35.73 kg/m.  Diet, exercise and weight reduction advised.     Out of bed to chair. Incentive spirometry. Nursing supportive care. Fall, aspiration precautions. DVT prophylaxis: Eliquis   Code Status: Full Code    Family Communication: Discussed with patient's family       Disposition: Status is: Inpatient Remains inpatient appropriate because: HD, Inpatient rehab placement pending  Planned Discharge Destination: [employer] authorization has expired and this has been reapplied for We will continue discussion with transition of care manager   Time spent: 35 minutes     Physical Exam:     General - Elderly obese  Caucasian male in no acute distress HEENT - PERRLA, EOMI, atraumatic head, non tender sinuses. Lung - Clear, bibasal rales, rhonchi, no wheezes. Heart - S1, S2 heard, no murmurs, rubs, 2+  pedal edema. Abdomen - Soft, non tender, non distended, bowel sounds good Neuro - Alert, awake and oriented x 3, non focal exam. Skin - Warm and dry. Chronic lower extremity skin changes, lymphedema   Data Reviewed: I have reviewed nephrology documentation, PT OT notes, below mentioned labs as well as vitals     Latest Ref Rng & Units 01/02/2023    5:22 AM 01/01/2023    6:20 AM 12/31/2022    6:04 AM  CBC  WBC 4.0 - 10.5 K/uL 9.7  9.3  9.6   Hemoglobin 13.0 - 17.0 g/dL 95.1  88.4  16.6   Hematocrit 39.0 - 52.0 % 32.7  34.9  34.3   Platelets 150 - 400 K/uL 199  205  216        Latest Ref Rng & Units 01/02/2023    5:23 AM 01/02/2023    5:22 AM 01/01/2023    6:21 AM  BMP  Glucose 70 - 99 mg/dL 063  016  83   BUN 8 - 23 mg/dL 65  67  42   Creatinine 0.61 - 1.24 mg/dL 0.10  9.32  3.55   Sodium 135 - 145 mmol/L 136  138  138   Potassium 3.5 - 5.1 mmol/L 3.8  3.9   3.8   Chloride 98 - 111 mmol/L 98  98  97   CO2 22 - 32 mmol/L 23  23  27    Calcium 8.9 - 10.3 mg/dL 7.9  8.0  8.2     Vitals:   01/02/23 1230 01/02/23 1236 01/02/23 1253 01/02/23 1349  BP: 125/75 136/79  121/73  Pulse: 73 70  (!) 58  Resp: (!) 24 (!) 29  18  Temp:  98 F (36.7 C)  97.9 F (36.6 C)  TempSrc:  Axillary  Oral  SpO2: 98% 99%  99%  Weight:   84.7 kg   Height:         Author: Loyce Dys, MD 01/02/2023 3:03 PM  For on call review [email or web address].

## 2023-01-02 NOTE — Progress Notes (Signed)
[facility]  ROUNDING NOTE   Subjective:    Patient seen laying in bed No family at bedside Awaiting breakfast Denies pain or discomfort  Remains on room air Lower extremity edema greatly improved  Dialysis scheduled for later this morning  Objective:  Vital signs in last 24 hours:  Temp:  [97.6 F (36.4 C)-98 F (36.7 C)] 97.6 F (36.4 C) (10/12 0858) Pulse Rate:  [52-71] 60 (10/12 0930) Resp:  [17-25] 22 (10/12 0930) BP: (109-172)/(51-81) 151/62 (10/12 0930) SpO2:  [97 %-100 %] 100 % (10/12 0930) Weight:  [87.6 kg-92 kg] 87.6 kg (10/12 0857)  Weight change: 2 kg Filed Weights   12/31/22 0500 01/01/23 0348 01/02/23 0400  Weight: 93 kg 90 kg 92 kg    Intake/Output: I/O last 3 completed shifts: In: 360 [P.O.:360] Out: -    Intake/Output this shift:  No intake/output data recorded.  Physical Exam: General: NAD  Head: Normocephalic, atraumatic. Moist oral mucosal membranes  Eyes: Anicteric, ectropion  Lungs:  Clear to auscultation, room air  Heart: S1S2 no rubs  Abdomen:  Soft, nontender  Extremities: Trace peripheral edema.  Neurologic: Awake, alert  Skin: No lesions  Access: Rt Chest permcath    Basic Metabolic Panel: Recent Labs  Lab 12/28/22 1036 12/29/22 0433 12/31/22 0603 12/31/22 0604 01/01/23 0620 01/01/23 0621 01/02/23 0522 01/02/23 0523  NA 138 138 136 137 137 138 138 136  K 3.7 4.0 3.8 3.9 3.7 3.8 3.9 3.8  CL 100 101 97* 98 96* 97* 98 98  CO2 24 25 23 23 26 27 23 23   GLUCOSE 169* 116* 88 92 82 83 113* 113*  BUN 75* 86* 72* 74* 43* 42* 67* 65*  CREATININE 4.94* 5.68* 5.46* 5.44* 3.71* 3.69* 5.23* 5.32*  CALCIUM 8.1* 8.4* 8.1* 8.2* 8.2* 8.2* 8.0* 7.9*  PHOS 5.5* 6.7* 6.4*  --   --  4.7*  --  6.1*    Liver Function Tests: Recent Labs  Lab 12/28/22 1036 12/29/22 0433 12/31/22 0603 01/01/23 0621 01/02/23 0523  ALBUMIN 3.2* 3.5 3.2* 3.3* 3.1*   No results for input(s): "LIPASE", "AMYLASE" in the last 168 hours. No  results for input(s): "AMMONIA" in the last 168 hours.  CBC: Recent Labs  Lab 12/26/22 1021 12/31/22 0604 01/01/23 0620 01/02/23 0522  WBC 10.8* 9.6 9.3 9.7  NEUTROABS  --  7.2 7.0 7.1  HGB 10.8* 11.4* 11.3* 10.9*  HCT 33.8* 34.3* 34.9* 32.7*  MCV 93.1 90.0 91.6 89.8  PLT 253 216 205 199    Cardiac Enzymes: No results for input(s): "CKTOTAL", "CKMB", "CKMBINDEX", "TROPONINI" in the last 168 hours.  BNP: Invalid input(s): "POCBNP"  CBG: Recent Labs  Lab 01/01/23 0935 01/01/23 1243 01/01/23 1522 01/01/23 2027 01/02/23 0757  GLUCAP 124* 208* 172* 197* 112*    Microbiology: Results for orders placed or performed during the hospital encounter of 11/26/22  MRSA Next Gen by PCR, Nasal     Status: None   Collection Time: 11/30/22  4:10 PM   Specimen: Nasal Mucosa; Nasal Swab  Result Value Ref Range Status   MRSA by PCR Next Gen NOT DETECTED NOT DETECTED Final    Comment: (NOTE) The GeneXpert MRSA Assay (FDA approved for NASAL specimens only), is one component of a comprehensive MRSA colonization surveillance program. It is not intended to diagnose MRSA infection nor to guide or monitor treatment for MRSA infections. Test performance is not FDA approved in patients less than 79 years old. Performed at [facility], [id number]  [address] [place], [place] [address]     Coagulation Studies: No results for input(s): "LABPROT", "INR" in the last 72 hours.    Urinalysis: No results for input(s): "COLORURINE", "LABSPEC", "PHURINE", "GLUCOSEU", "HGBUR", "BILIRUBINUR", "KETONESUR", "PROTEINUR", "UROBILINOGEN", "NITRITE", "LEUKOCYTESUR" in the last 72 hours.  Invalid input(s): "APPERANCEUR"    Imaging: No results found.   Medications:    sodium chloride      apixaban  2.5 mg Oral BID   vitamin C  500 mg Oral BID   Chlorhexidine Gluconate Cloth  6 each Topical Daily   cholecalciferol  2,000 Units Oral Daily   clotrimazole-betamethasone   Topical BID    docusate sodium  100 mg Oral BID   doxazosin  1 mg Oral BID   fluticasone furoate-vilanterol  1 puff Inhalation Daily   furosemide  80 mg Oral Q M,W,F   hydrALAZINE  25 mg Oral TID   insulin aspart  0-15 Units Subcutaneous TID AC & HS   insulin aspart  3 Units Subcutaneous TID WC   insulin glargine-yfgn  20 Units Subcutaneous Daily   isosorbide mononitrate  60 mg Oral Daily   multivitamin  1 tablet Oral QHS   neomycin-polymyxin b-dexamethasone  1 Application Right Eye TID   omega-3 acid ethyl esters  1 g Oral Daily   pantoprazole  40 mg Oral Daily   rosuvastatin  10 mg Oral Once per day on Monday Wednesday Friday   sodium chloride flush  3 mL Intravenous Q12H   sodium chloride, acetaminophen **OR** acetaminophen, bisacodyl, calcium carbonate, diphenhydrAMINE-zinc acetate, heparin sodium (porcine), hydrOXYzine, ipratropium-albuterol, morphine injection, nitroGLYCERIN, ondansetron **OR** ondansetron (ZOFRAN) IV, oxyCODONE, polyethylene glycol, simethicone, sodium chloride flush, traZODone  Assessment/ Plan:  [name] is a 82 y.o.  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 Due to progression of kidney disease and outpatient diuretic therapy failure, we feel this patient has progressed to end stage renal disease.  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 CRRT from 9/12 to 9/13 Scheduled to receive dialysis today, UF goal 2.5 to 3 L as tolerated.  Will obtain standing weight post treatments.  Next dialysis treatment scheduled for Tuesday. CIR discharge delayed, will continue to follow discharge planning.  Acute  exacerbation of chronic diastolic congestive heart failure: complicated with acute respiratory failure and pulmonary hypertension. -Dialysis and oral diuresis used to manage fluid volume - Continue oral Furosemide 80mg  on non-HD days  Diabetes mellitus type II with chronic kidney disease - holding empagliflozin -Glucose elevated at times  -Primary team to manage sliding scale insulin  4.  Anemia of chronic kidney disease. Will continue to assess need for EPO once below 10.  Hemoglobin 10.9.  5.  Secondary hyperparathyroidism.  Continue to periodically monitor bone metabolism parameters.  Continue cholecalciferol.   LOS: 37 Malayna Noori 10/12/202410:08 AM

## 2023-01-02 NOTE — TOC Progression Note (Signed)
Transition of Care [facility]) - Progression Note    Patient Details  Name: Kyle Roberson MRN: [id number] Date of Birth: [birth year]  Transition of Care [facility]) CM/SW Contact  Kemper Durie, RN Phone Number: 01/02/2023, 4:05 PM  Clinical Narrative:     Reached out to [facility] with CIR for update regarding authorization.  No update at this time.  Expected Discharge Plan: IP Rehab Facility Barriers to Discharge: No Barriers Identified  Expected Discharge Plan and Services       Living arrangements for the past 2 months: Single Family Home Expected Discharge Date: 01/01/23                                     Social Determinants of Health (SDOH) Interventions SDOH Screenings   Food Insecurity: No Food Insecurity (12/01/2022)  Housing: Low Risk  (12/01/2022)  Transportation Needs: No Transportation Needs (12/01/2022)  Utilities: Not At Risk (12/01/2022)  Depression (PHQ2-9): Low Risk  (07/27/2022)  Tobacco Use: Medium Risk (11/26/2022)    Readmission Risk Interventions     No data to display

## 2023-01-02 NOTE — Progress Notes (Signed)
  Received patient in bed to unit.   Informed consent signed and in chart.    TX duration: 3.5hrs     Transported back to floor  Hand-off given to patient's nurse. No c/o and no distress noted    Access used: R HD Catheter Access issues: none   Total UF removed: 3.0L Medication(s) given: none Post HD VS: 136/79 Post HD weight: 84.7kg     Lynann Beaver  [facility]

## 2023-01-03 DIAGNOSIS — I5033 Acute on chronic diastolic (congestive) heart failure: Secondary | ICD-10-CM | POA: Diagnosis not present

## 2023-01-03 LAB — GLUCOSE, CAPILLARY
Glucose-Capillary: 102 mg/dL — ABNORMAL HIGH (ref 70–99)
Glucose-Capillary: 217 mg/dL — ABNORMAL HIGH (ref 70–99)
Glucose-Capillary: 173 mg/dL — ABNORMAL HIGH (ref 70–99)
Glucose-Capillary: 132 mg/dL — ABNORMAL HIGH (ref 70–99)

## 2023-01-03 LAB — RENAL FUNCTION PANEL
Albumin: 3.3 g/dL — ABNORMAL LOW (ref 3.5–5.0)
Anion gap: 15 (ref 5–15)
BUN: 43 mg/dL — ABNORMAL HIGH (ref 8–23)
CO2: 25 mmol/L (ref 22–32)
Calcium: 8.3 mg/dL — ABNORMAL LOW (ref 8.9–10.3)
Chloride: 97 mmol/L — ABNORMAL LOW (ref 98–111)
Creatinine, Ser: 3.82 mg/dL — ABNORMAL HIGH (ref 0.61–1.24)
GFR, Estimated: 15 mL/min — ABNORMAL LOW (ref >60)
Glucose, Bld: 80 mg/dL (ref 70–99)
Phosphorus: 4.8 mg/dL — ABNORMAL HIGH (ref 2.5–4.6)
Potassium: 3.7 mmol/L (ref 3.5–5.1)
Sodium: 137 mmol/L (ref 135–145)

## 2023-01-03 MED ORDER — SODIUM CHLORIDE 0.9 % IV BOLUS: 250.0000 mL | Freq: Once | INTRAVENOUS | Status: DC

## 2023-01-03 NOTE — Progress Notes (Signed)
Progress Note   Patient: Kyle Roberson WJX:[id number] DOB: 09/16/1940 DOA: 11/26/2022     38 DOS: the patient was seen and examined on 01/03/2023    Subjective:  Patient seen and examined at bedside this morning Denies nausea vomiting abdominal pain or chest pain Transition of care manager still working on placement  Brief hospital course: 81yo with h/o CAD, stage 5 CKD, chronic diastolic CHF, BLE lymphedema, afib on Eliquis, PAH, and SDH presented on 09/05 with weight gain and SOB.     Diagnosed with acute on chronic CHF and started on Lasix drip.  Cardiology was consulted. Echo this admission with EF 55-60%, mild LVH, moderate RV enlargement with mildly decreased RV systolic function, PASP 73 mmHg, severe LAE, mild-moderate TR, IVC dilated.  IV midodrine drip 09/09.   RHC done on 09/11 with elevated R > L filling pressures, mostly right-sided, worsening renal function and decreased UOP so Lasix stopped.  Started on CRRT 09/12 for additional volume removal and completed this. 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  He is on IV Lasix.  Was on heparin gtt for Afib, has been bradycardic w/ ventricular rates 40-50s but go into 60-70 range on standing. 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  Dry weight around ~220 pounds Monitor input output and creatinine fluid management w/ dialysis and oral furosemide 80mg  daily on non-HD days   Overall poor prognosis given advanced renal disease. Palliative discussed with him and his wife. Continue hemodialysis Plan of care discussed with nephrologist Still awaiting placement in acute rehab   Acute hypoxic respiratory failure d/t volume overload / CHF/COPD Supplemental O2 to wean as able.   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 He is on TTS schedule. Dialysis following catheter placement 09/23 which became nonfunctional on 9/24 Overall very poor prognosis 9/25 permanent HD catheter was placed by vascular surgery. Continue HD per nephrology team. Outpatient clinic arranged at [facility] on TTS.  Plan of care discussed with nephrology team     COPD exacerbation Patient received a course of IV steroids and was transitioned to oral steroid and has completed 5 days therapy Continue to monitor saturation and respiratory function closely Continue Breo Ellipta, DuoNebs   CAD with remote PCI Continue statin, No ASA 9/30 resumed Imdur 60 mg p.o. daily, patient had an episode of chest pain at night on 9/29 which resolved after 2 doses of nitroglycerin. Cardiology following.   Atrial fibrillation: Permanent. Bradycardic currently Apixaban was held and patient was on heparin which was discontinued and apixaban 2.5 mg p.o. twice daily was resumed.       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  Hyperphosphatemia Continue binders per nephro   Hypertension Continue doxazosin, hydralazine   Hyperlipidemia Continue rosuvastatin, Lovaza   Bilateral lower extremity lymphedema, chronic Chronic skin changes, dermatitis and possible fungal  infection Continue Lotrisone cream twice daily   GERD  Continue pantoprazole 40 mg p.o. daily,Tums as well    Obesity Complicates overall prognosis Body mass index is 35.73 kg/m.  Diet, exercise and weight reduction advised.     Out of bed to chair. Incentive spirometry. Nursing supportive care. Fall, aspiration precautions. DVT prophylaxis: Eliquis   Code Status: Full Code    Family Communication: Discussed with patient's family       Disposition: Status is: Inpatient Remains inpatient appropriate because: HD, Inpatient rehab placement pending  Planned Discharge Destination: [employer] authorization has expired and this has been reapplied for We will continue discussion with transition of care manager   Time spent: 35 minutes     Physical Exam:     General - Elderly obese  Caucasian male in no acute distress HEENT - PERRLA, EOMI, atraumatic head, non tender sinuses. Lung - Clear, bibasal rales, rhonchi, no wheezes. Heart - S1, S2 heard, no murmurs, rubs, 2+  pedal edema. Abdomen - Soft, non tender, non distended, bowel sounds good Neuro - Alert, awake and oriented x 3, non focal exam. Skin - Warm and dry. Chronic lower extremity skin changes, lymphedema   Data Reviewed: I have reviewed nephrology documentation, patient's labs and vitals stated below as well as PT OT notes  Vitals:   01/02/23 2056 01/03/23 0508 01/03/23 0829 01/03/23 1501  BP: (!) 109/49 124/65 136/64 (!) 87/54  Pulse: 68 (!) 57 62 72  Resp: 18 18 19    Temp: 97.6 F (36.4 C) 97.6 F (36.4 C) 98.3 F (36.8 C) 98.2 F (36.8 C)  TempSrc: Oral Oral  Oral  SpO2: 99% 99% 97% 100%  Weight:      Height:         Author: Loyce Dys, MD 01/03/2023 4:09 PM  For on call review [email or web address].

## 2023-01-03 NOTE — Progress Notes (Signed)
Physical Therapy Treatment Patient Details Name: Kyle Roberson MRN: [id number] DOB: 03/26/1940 Today's Date: 01/03/2023   History of Present Illness Kyle Roberson is an 81yoM who comes to [facility] from OP nephrology on 9/5 due to LEE and ABD swelling. PMH: CKD, CHF. As of 9/9 he was transferred to ICU for IV milrinone gtt per [facility] heart failure MD. Started CRRT this admission and transitioned to HD.    PT Comments  Pt ready to get up.  He is given time but does need min a x 1 for hand held assist to get to EOB.  Steady in sitting.  He is able to stand with mod a x 1 and is able to walk to door and back with RW and min a x 1 with short seated rest break.  Transfers do improve during session to min a x 1 with good power up from recliner.   Stands 2 additional times from recliner for standing ex.  Pt remains up in recliner.  Wife and and supportive.  Both motivated and excited for possible CIR transition.   If plan is discharge home, recommend the following: Assist for transportation;Help with stairs or ramp for entrance;Assistance with cooking/housework;Two people to help with walking and/or transfers;Two people to help with bathing/dressing/bathroom   Can travel by private vehicle        Equipment Recommendations  None recommended by PT    Recommendations for Other Services       Precautions / Restrictions Precautions Precautions: Fall Restrictions Weight Bearing Restrictions: No RLE Weight Bearing: Weight bearing as tolerated LLE Weight Bearing: Weight bearing as tolerated Other Position/Activity Restrictions: does not wear CAM boot for mobility today     Mobility  Bed Mobility Overal bed mobility: Needs Assistance Bed Mobility: Supine to Sit Rolling: Min assist           Patient Response: Cooperative  Transfers Overall transfer level: Needs assistance Equipment used: Rolling walker (2 wheels) Transfers: Sit to/from Stand Sit to Stand: Min assist, Mod assist            General transfer comment: transfer assist varried during session    Ambulation/Gait Ambulation/Gait assistance: Min assist Gait Distance (Feet): 45 Feet Assistive device: Rolling walker (2 wheels) Gait Pattern/deviations: Step-through pattern, Decreased step length - left, Decreased step length - right Gait velocity: decreased     General Gait Details: 45' x 2 with seated rest break.  no chair follow needed for in room but would benefit from it in hallway but he declined to go out of room   Stairs             Wheelchair Mobility     Tilt Bed Tilt Bed Patient Response: Cooperative  Modified Rankin (Stroke Patients Only)       Balance Overall balance assessment: Needs assistance Sitting-balance support: Feet supported, Single extremity supported Sitting balance-Leahy Scale: Good     Standing balance support: Bilateral upper extremity supported, Reliant on assistive device for balance Standing balance-Leahy Scale: Fair Standing balance comment: external support required                            Cognition Arousal: Alert Behavior During Therapy: WFL for tasks assessed/performed Overall Cognitive Status: Within Functional Limits for tasks assessed                                 General  Comments: pleasant and motivated to participate in PT        Exercises Other Exercises Other Exercises: seated AROM    General Comments        Pertinent Vitals/Pain Pain Assessment Pain Assessment: Faces Faces Pain Scale: Hurts little more Pain Location: R elbow Pain Descriptors / Indicators: Discomfort, Grimacing Pain Intervention(s): Limited activity within patient's tolerance    Home Living                          Prior Function            PT Goals (current goals can now be found in the care plan section) Progress towards PT goals: Progressing toward goals    Frequency    Min 1X/week      PT Plan       Co-evaluation              AM-PAC PT "6 Clicks" Mobility   Outcome Measure  Help needed turning from your back to your side while in a flat bed without using bedrails?: A Little Help needed moving from lying on your back to sitting on the side of a flat bed without using bedrails?: A Little Help needed moving to and from a bed to a chair (including a wheelchair)?: A Little Help needed standing up from a chair using your arms (e.g., wheelchair or bedside chair)?: A Little Help needed to walk in hospital room?: A Little Help needed climbing 3-5 steps with a railing? : A Lot 6 Click Score: 17    End of Session Equipment Utilized During Treatment: Gait belt Activity Tolerance: Patient limited by fatigue Patient left: in chair;with call bell/phone within reach;with chair alarm set Nurse Communication: Mobility status PT Visit Diagnosis: Unsteadiness on feet (R26.81);Muscle weakness (generalized) (M62.81)     Time: 2595-6387 PT Time Calculation (min) (ACUTE ONLY): 26 min  Charges:    $Gait Training: 8-22 mins PT General Charges $$ ACUTE PT VISIT: 1 Visit                   Danielle Dess, PTA 01/03/23, 2:47 PM

## 2023-01-03 NOTE — Progress Notes (Signed)
[facility]  ROUNDING NOTE   Subjective:   Patient seen laying in bed No family present Awaiting breakfast Remains frustrated with CIR denial    Objective:  Vital signs in last 24 hours:  Temp:  [97.6 F (36.4 C)-98.3 F (36.8 C)] 98.3 F (36.8 C) (10/13 0829) Pulse Rate:  [57-73] 62 (10/13 0829) Resp:  [18-32] 19 (10/13 0829) BP: (109-160)/(49-79) 136/64 (10/13 0829) SpO2:  [95 %-100 %] 97 % (10/13 0829) Weight:  [84.7 kg] 84.7 kg (10/12 1253)  Weight change: -7.3 kg Filed Weights   01/01/23 0348 01/02/23 0400 01/02/23 1253  Weight: 90 kg 92 kg 84.7 kg    Intake/Output: I/O last 3 completed shifts: In: 240 [P.O.:240] Out: 3000 [Other:3000]   Intake/Output this shift:  No intake/output data recorded.  Physical Exam: General: NAD  Head: Normocephalic, atraumatic. Moist oral mucosal membranes  Eyes: Anicteric, ectropion  Lungs:  Clear to auscultation, room air  Heart: S1S2 no rubs  Abdomen:  Soft, nontender  Extremities: Trace peripheral edema.  Neurologic: Awake, alert  Skin: No lesions  Access: Rt Chest permcath    Basic Metabolic Panel: Recent Labs  Lab 12/29/22 0433 12/31/22 0603 12/31/22 0604 01/01/23 6578 01/01/23 4696 01/02/23 0522 01/02/23 0523 01/03/23 0432  NA 138 136   < > 137 138 138 136 137  K 4.0 3.8   < > 3.7 3.8 3.9 3.8 3.7  CL 101 97*   < > 96* 97* 98 98 97*  CO2 25 23   < > 26 27 23 23 25   GLUCOSE 116* 88   < > 82 83 113* 113* 80  BUN 86* 72*   < > 43* 42* 67* 65* 43*  CREATININE 5.68* 5.46*   < > 3.71* 3.69* 5.23* 5.32* 3.82*  CALCIUM 8.4* 8.1*   < > 8.2* 8.2* 8.0* 7.9* 8.3*  PHOS 6.7* 6.4*  --   --  4.7*  --  6.1* 4.8*   < > = values in this interval not displayed.    Liver Function Tests: Recent Labs  Lab 12/29/22 0433 12/31/22 0603 01/01/23 0621 01/02/23 0523 01/03/23 0432  ALBUMIN 3.5 3.2* 3.3* 3.1* 3.3*   No results for input(s): "LIPASE", "AMYLASE" in the last 168 hours. No results for input(s):  "AMMONIA" in the last 168 hours.  CBC: Recent Labs  Lab 12/31/22 0604 01/01/23 0620 01/02/23 0522  WBC 9.6 9.3 9.7  NEUTROABS 7.2 7.0 7.1  HGB 11.4* 11.3* 10.9*  HCT 34.3* 34.9* 32.7*  MCV 90.0 91.6 89.8  PLT 216 205 199    Cardiac Enzymes: No results for input(s): "CKTOTAL", "CKMB", "CKMBINDEX", "TROPONINI" in the last 168 hours.  BNP: Invalid input(s): "POCBNP"  CBG: Recent Labs  Lab 01/01/23 2027 01/02/23 0757 01/02/23 1345 01/02/23 2138 01/03/23 0831  GLUCAP 197* 112* 107* 261* 102*    Microbiology: Results for orders placed or performed during the hospital encounter of 11/26/22  MRSA Next Gen by PCR, Nasal     Status: None   Collection Time: 11/30/22  4:10 PM   Specimen: Nasal Mucosa; Nasal Swab  Result Value Ref Range Status   MRSA by PCR Next Gen NOT DETECTED NOT DETECTED Final    Comment: (NOTE) The GeneXpert MRSA Assay (FDA approved for NASAL specimens only), is one component of a comprehensive MRSA colonization surveillance program. It is not intended to diagnose MRSA infection nor to guide or monitor treatment for MRSA infections. Test performance is not FDA approved in patients less than 2  years old. Performed at [facility], [address] [place], [place] [address]     Coagulation Studies: No results for input(s): "LABPROT", "INR" in the last 72 hours.    Urinalysis: No results for input(s): "COLORURINE", "LABSPEC", "PHURINE", "GLUCOSEU", "HGBUR", "BILIRUBINUR", "KETONESUR", "PROTEINUR", "UROBILINOGEN", "NITRITE", "LEUKOCYTESUR" in the last 72 hours.  Invalid input(s): "APPERANCEUR"    Imaging: No results found.   Medications:    sodium chloride      apixaban  2.5 mg Oral BID   vitamin C  500 mg Oral BID   Chlorhexidine Gluconate Cloth  6 each Topical Daily   cholecalciferol  2,000 Units Oral Daily   clotrimazole-betamethasone   Topical BID   docusate sodium  100 mg Oral BID   doxazosin  1 mg Oral BID    fluticasone furoate-vilanterol  1 puff Inhalation Daily   furosemide  80 mg Oral Q M,W,F   hydrALAZINE  25 mg Oral TID   insulin aspart  0-15 Units Subcutaneous TID AC & HS   insulin aspart  3 Units Subcutaneous TID WC   insulin glargine-yfgn  20 Units Subcutaneous Daily   isosorbide mononitrate  60 mg Oral Daily   multivitamin  1 tablet Oral QHS   neomycin-polymyxin b-dexamethasone  1 Application Right Eye TID   omega-3 acid ethyl esters  1 g Oral Daily   pantoprazole  40 mg Oral Daily   rosuvastatin  10 mg Oral Once per day on Monday Wednesday Friday   sodium chloride flush  3 mL Intravenous Q12H   sodium chloride, acetaminophen **OR** acetaminophen, bisacodyl, calcium carbonate, diphenhydrAMINE-zinc acetate, heparin sodium (porcine), hydrOXYzine, ipratropium-albuterol, morphine injection, nitroGLYCERIN, ondansetron **OR** ondansetron (ZOFRAN) IV, oxyCODONE, polyethylene glycol, simethicone, sodium chloride flush, traZODone  Assessment/ Plan:  [name] is a 82 y.o.  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 Due to progression of kidney disease and outpatient diuretic therapy failure, we feel this patient has progressed to end stage renal disease.  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 CRRT from 9/12 to 9/13 Dialysis received yesterday, UF 3L achieved. Post weight: 84.7kg . Next dialysis treatment scheduled for Tuesday. Monitoring discharge plan  Acute exacerbation of chronic diastolic congestive heart failure: complicated with acute respiratory failure and pulmonary hypertension. - Oral Furosemide 80mg  on  non-HD days  Diabetes mellitus type II with chronic kidney disease - holding empagliflozin  -Sliding scale insulin managed by primary team.  4.  Anemia of chronic kidney disease. Will continue to assess need for EPO once below 10.  Hemoglobin remains acceptable.  5.  Secondary hyperparathyroidism.  Continue to periodically monitor bone metabolism parameters.  Continue cholecalciferol.  Calcium and phosphorus within optimal range.   LOS: 38   10/13/20249:29 AM

## 2023-01-04 ENCOUNTER — Encounter (HOSPITAL_COMMUNITY): Payer: Self-pay | Admitting: Physical Medicine & Rehabilitation

## 2023-01-04 ENCOUNTER — Other Ambulatory Visit: Payer: Self-pay

## 2023-01-04 ENCOUNTER — Inpatient Hospital Stay (HOSPITAL_COMMUNITY)
Admission: AD | Admit: 2023-01-04 | Discharge: 2023-01-18 | DRG: 945 | Disposition: A | Discharge status: Home / Self Care | Payer: Medicare HMO | Source: Other Acute Inpatient Hospital | Attending: Physical Medicine & Rehabilitation | Admitting: Physical Medicine & Rehabilitation

## 2023-01-04 DIAGNOSIS — R5381 Other malaise: Secondary | ICD-10-CM | POA: Diagnosis present

## 2023-01-04 DIAGNOSIS — Y92239 Unspecified place in hospital as the place of occurrence of the external cause: Secondary | ICD-10-CM | POA: Diagnosis not present

## 2023-01-04 DIAGNOSIS — I953 Hypotension of hemodialysis: Secondary | ICD-10-CM | POA: Diagnosis not present

## 2023-01-04 DIAGNOSIS — I4821 Permanent atrial fibrillation: Secondary | ICD-10-CM | POA: Diagnosis present

## 2023-01-04 DIAGNOSIS — Z8249 Family history of ischemic heart disease and other diseases of the circulatory system: Secondary | ICD-10-CM | POA: Diagnosis not present

## 2023-01-04 DIAGNOSIS — E669 Obesity, unspecified: Secondary | ICD-10-CM | POA: Diagnosis present

## 2023-01-04 DIAGNOSIS — E11649 Type 2 diabetes mellitus with hypoglycemia without coma: Secondary | ICD-10-CM | POA: Diagnosis not present

## 2023-01-04 DIAGNOSIS — Z955 Presence of coronary angioplasty implant and graft: Secondary | ICD-10-CM

## 2023-01-04 DIAGNOSIS — I2721 Secondary pulmonary arterial hypertension: Secondary | ICD-10-CM | POA: Diagnosis present

## 2023-01-04 DIAGNOSIS — I872 Venous insufficiency (chronic) (peripheral): Secondary | ICD-10-CM | POA: Diagnosis present

## 2023-01-04 DIAGNOSIS — I132 Hypertensive heart and chronic kidney disease with heart failure and with stage 5 chronic kidney disease, or end stage renal disease: Secondary | ICD-10-CM | POA: Diagnosis present

## 2023-01-04 DIAGNOSIS — N186 End stage renal disease: Secondary | ICD-10-CM | POA: Diagnosis present

## 2023-01-04 DIAGNOSIS — Z87891 Personal history of nicotine dependence: Secondary | ICD-10-CM

## 2023-01-04 DIAGNOSIS — I4891 Unspecified atrial fibrillation: Secondary | ICD-10-CM | POA: Diagnosis not present

## 2023-01-04 DIAGNOSIS — R531 Weakness: Secondary | ICD-10-CM | POA: Diagnosis present

## 2023-01-04 DIAGNOSIS — Z794 Long term (current) use of insulin: Secondary | ICD-10-CM

## 2023-01-04 DIAGNOSIS — Z7901 Long term (current) use of anticoagulants: Secondary | ICD-10-CM

## 2023-01-04 DIAGNOSIS — Z6830 Body mass index (BMI) 30.0-30.9, adult: Secondary | ICD-10-CM

## 2023-01-04 DIAGNOSIS — I5032 Chronic diastolic (congestive) heart failure: Secondary | ICD-10-CM | POA: Diagnosis present

## 2023-01-04 DIAGNOSIS — K219 Gastro-esophageal reflux disease without esophagitis: Secondary | ICD-10-CM | POA: Diagnosis present

## 2023-01-04 DIAGNOSIS — J449 Chronic obstructive pulmonary disease, unspecified: Secondary | ICD-10-CM | POA: Diagnosis present

## 2023-01-04 DIAGNOSIS — D631 Anemia in chronic kidney disease: Secondary | ICD-10-CM | POA: Diagnosis present

## 2023-01-04 DIAGNOSIS — E785 Hyperlipidemia, unspecified: Secondary | ICD-10-CM | POA: Diagnosis present

## 2023-01-04 DIAGNOSIS — W19XXXA Unspecified fall, initial encounter: Secondary | ICD-10-CM | POA: Diagnosis not present

## 2023-01-04 DIAGNOSIS — Z79899 Other long term (current) drug therapy: Secondary | ICD-10-CM | POA: Diagnosis not present

## 2023-01-04 DIAGNOSIS — I2510 Atherosclerotic heart disease of native coronary artery without angina pectoris: Secondary | ICD-10-CM | POA: Diagnosis present

## 2023-01-04 DIAGNOSIS — I5033 Acute on chronic diastolic (congestive) heart failure: Secondary | ICD-10-CM | POA: Diagnosis not present

## 2023-01-04 DIAGNOSIS — Z7984 Long term (current) use of oral hypoglycemic drugs: Secondary | ICD-10-CM

## 2023-01-04 DIAGNOSIS — Z833 Family history of diabetes mellitus: Secondary | ICD-10-CM

## 2023-01-04 DIAGNOSIS — M25562 Pain in left knee: Secondary | ICD-10-CM | POA: Diagnosis not present

## 2023-01-04 DIAGNOSIS — Z888 Allergy status to other drugs, medicaments and biological substances status: Secondary | ICD-10-CM

## 2023-01-04 DIAGNOSIS — K5900 Constipation, unspecified: Secondary | ICD-10-CM | POA: Diagnosis present

## 2023-01-04 DIAGNOSIS — I2729 Other secondary pulmonary hypertension: Secondary | ICD-10-CM | POA: Diagnosis present

## 2023-01-04 DIAGNOSIS — E1122 Type 2 diabetes mellitus with diabetic chronic kidney disease: Secondary | ICD-10-CM | POA: Diagnosis present

## 2023-01-04 DIAGNOSIS — N2581 Secondary hyperparathyroidism of renal origin: Secondary | ICD-10-CM | POA: Diagnosis present

## 2023-01-04 DIAGNOSIS — R001 Bradycardia, unspecified: Secondary | ICD-10-CM | POA: Diagnosis present

## 2023-01-04 DIAGNOSIS — Z992 Dependence on renal dialysis: Secondary | ICD-10-CM | POA: Diagnosis not present

## 2023-01-04 DIAGNOSIS — J9601 Acute respiratory failure with hypoxia: Secondary | ICD-10-CM | POA: Diagnosis present

## 2023-01-04 DIAGNOSIS — L309 Dermatitis, unspecified: Secondary | ICD-10-CM | POA: Diagnosis present

## 2023-01-04 DIAGNOSIS — I10 Essential (primary) hypertension: Secondary | ICD-10-CM | POA: Diagnosis not present

## 2023-01-04 LAB — RENAL FUNCTION PANEL
Albumin: 3.1 g/dL — ABNORMAL LOW (ref 3.5–5.0)
Anion gap: 19 — ABNORMAL HIGH (ref 5–15)
BUN: 69 mg/dL — ABNORMAL HIGH (ref 8–23)
CO2: 23 mmol/L (ref 22–32)
Calcium: 8.4 mg/dL — ABNORMAL LOW (ref 8.9–10.3)
Chloride: 98 mmol/L (ref 98–111)
Creatinine, Ser: 5.71 mg/dL — ABNORMAL HIGH (ref 0.61–1.24)
GFR, Estimated: 9 mL/min — ABNORMAL LOW (ref >60)
Glucose, Bld: 66 mg/dL — ABNORMAL LOW (ref 70–99)
Phosphorus: 7.2 mg/dL — ABNORMAL HIGH (ref 2.5–4.6)
Potassium: 4.1 mmol/L (ref 3.5–5.1)
Sodium: 140 mmol/L (ref 135–145)

## 2023-01-04 LAB — GLUCOSE, CAPILLARY
Glucose-Capillary: 87 mg/dL (ref 70–99)
Glucose-Capillary: 173 mg/dL — ABNORMAL HIGH (ref 70–99)
Glucose-Capillary: 223 mg/dL — ABNORMAL HIGH (ref 70–99)
Glucose-Capillary: 145 mg/dL — ABNORMAL HIGH (ref 70–99)

## 2023-01-04 LAB — CBC WITH DIFFERENTIAL/PLATELET
Abs Immature Granulocytes: 0.2 10*3/uL — ABNORMAL HIGH (ref 0.00–0.07)
Basophils Absolute: 0.1 10*3/uL (ref 0.0–0.1)
Basophils Relative: 1 %
Eosinophils Absolute: 0.4 10*3/uL (ref 0.0–0.5)
Eosinophils Relative: 4 %
HCT: 34.7 % — ABNORMAL LOW (ref 39.0–52.0)
Hemoglobin: 11.6 g/dL — ABNORMAL LOW (ref 13.0–17.0)
Immature Granulocytes: 2 %
Lymphocytes Relative: 11 %
Lymphs Abs: 1.1 10*3/uL (ref 0.7–4.0)
MCH: 29.9 pg (ref 26.0–34.0)
MCHC: 33.4 g/dL (ref 30.0–36.0)
MCV: 89.4 fL (ref 80.0–100.0)
Monocytes Absolute: 1.3 10*3/uL — ABNORMAL HIGH (ref 0.1–1.0)
Monocytes Relative: 12 %
Neutro Abs: 7.3 10*3/uL (ref 1.7–7.7)
Neutrophils Relative %: 70 %
Platelets: 222 10*3/uL (ref 150–400)
RBC: 3.88 MIL/uL — ABNORMAL LOW (ref 4.22–5.81)
RDW: 14.7 % (ref 11.5–15.5)
WBC: 10.2 10*3/uL (ref 4.0–10.5)
nRBC: 0 % (ref 0.0–0.2)

## 2023-01-04 LAB — MAGNESIUM: Magnesium: 1.6 mg/dL — ABNORMAL LOW (ref 1.7–2.4)

## 2023-01-04 MED ORDER — ONDANSETRON HCL 4 MG PO TABS: 4.0000 mg | ORAL_TABLET | Freq: Four times a day (QID) | ORAL | Status: DC | PRN

## 2023-01-04 MED ORDER — DIPHENHYDRAMINE-ZINC ACETATE 2-0.1 % EX CREA: TOPICAL_CREAM | Freq: Two times a day (BID) | CUTANEOUS | Status: DC | PRN

## 2023-01-04 MED ORDER — NEOMYCIN-POLYMYXIN-DEXAMETH 3.5-10000-0.1 OP OINT
1.0000 | TOPICAL_OINTMENT | Freq: Three times a day (TID) | OPHTHALMIC | Status: DC
  Administered 2023-01-04 – 2023-01-18 (×37): 1 via OPHTHALMIC
  Filled 2023-01-04: qty 3.5

## 2023-01-04 MED ORDER — CHLORHEXIDINE GLUCONATE CLOTH 2 % EX PADS
6.0000 | MEDICATED_PAD | Freq: Every day | CUTANEOUS | Status: DC
  Administered 2023-01-04: 6 via TOPICAL

## 2023-01-04 MED ORDER — FUROSEMIDE 40 MG PO TABS
80.0000 mg | ORAL_TABLET | ORAL | Status: DC
  Administered 2023-01-06 – 2023-01-11 (×3): 80 mg via ORAL
  Filled 2023-01-04 (×3): qty 2

## 2023-01-04 MED ORDER — ISOSORBIDE MONONITRATE ER 30 MG PO TB24
60.0000 mg | ORAL_TABLET | Freq: Every day | ORAL | Status: DC
  Administered 2023-01-05 – 2023-01-14 (×10): 60 mg via ORAL
  Filled 2023-01-04 (×10): qty 2

## 2023-01-04 MED ORDER — FLUTICASONE FUROATE-VILANTEROL 200-25 MCG/ACT IN AEPB: 1.0000 | INHALATION_SPRAY | Freq: Every day | RESPIRATORY_TRACT | Status: DC

## 2023-01-04 MED ORDER — INSULIN ASPART 100 UNIT/ML IJ SOLN
0.0000 [IU] | Freq: Three times a day (TID) | INTRAMUSCULAR | Status: DC
  Administered 2023-01-04: 5 [IU] via SUBCUTANEOUS
  Administered 2023-01-05: 2 [IU] via SUBCUTANEOUS
  Administered 2023-01-05: 5 [IU] via SUBCUTANEOUS
  Administered 2023-01-06: 3 [IU] via SUBCUTANEOUS
  Administered 2023-01-06 – 2023-01-08 (×3): 5 [IU] via SUBCUTANEOUS
  Administered 2023-01-09: 3 [IU] via SUBCUTANEOUS
  Administered 2023-01-09 – 2023-01-10 (×2): 8 [IU] via SUBCUTANEOUS
  Administered 2023-01-11: 3 [IU] via SUBCUTANEOUS
  Administered 2023-01-11: 2 [IU] via SUBCUTANEOUS
  Administered 2023-01-12: 3 [IU] via SUBCUTANEOUS
  Administered 2023-01-13 (×2): 2 [IU] via SUBCUTANEOUS
  Administered 2023-01-14: 8 [IU] via SUBCUTANEOUS
  Administered 2023-01-15 (×2): 3 [IU] via SUBCUTANEOUS
  Administered 2023-01-16: 8 [IU] via SUBCUTANEOUS
  Administered 2023-01-17: 3 [IU] via SUBCUTANEOUS

## 2023-01-04 MED ORDER — ACETAMINOPHEN 325 MG PO TABS
325.0000 mg | ORAL_TABLET | ORAL | Status: DC | PRN
  Administered 2023-01-10 – 2023-01-11 (×4): 650 mg via ORAL
  Administered 2023-01-11: 325 mg via ORAL
  Administered 2023-01-12 – 2023-01-13 (×2): 650 mg via ORAL
  Administered 2023-01-13: 325 mg via ORAL
  Administered 2023-01-13 – 2023-01-18 (×9): 650 mg via ORAL
  Filled 2023-01-04 (×20): qty 2

## 2023-01-04 MED ORDER — PANTOPRAZOLE SODIUM 40 MG PO TBEC
40.0000 mg | DELAYED_RELEASE_TABLET | Freq: Every day | ORAL | Status: DC
  Administered 2023-01-05 – 2023-01-14 (×10): 40 mg via ORAL
  Filled 2023-01-04 (×10): qty 1

## 2023-01-04 MED ORDER — DOCUSATE SODIUM 100 MG PO CAPS
100.0000 mg | ORAL_CAPSULE | Freq: Two times a day (BID) | ORAL | Status: DC
  Administered 2023-01-05 – 2023-01-18 (×7): 100 mg via ORAL
  Filled 2023-01-04 (×23): qty 1

## 2023-01-04 MED ORDER — ONDANSETRON HCL 4 MG/2ML IJ SOLN: 4.0000 mg | Freq: Four times a day (QID) | INTRAMUSCULAR | Status: DC | PRN

## 2023-01-04 MED ORDER — VITAMIN C 500 MG PO TABS
500.0000 mg | ORAL_TABLET | Freq: Two times a day (BID) | ORAL | Status: DC
  Administered 2023-01-04 – 2023-01-18 (×28): 500 mg via ORAL
  Filled 2023-01-04 (×28): qty 1

## 2023-01-04 MED ORDER — ONDANSETRON HCL 4 MG PO TABS
4.0000 mg | ORAL_TABLET | Freq: Four times a day (QID) | ORAL | Status: DC | PRN
  Administered 2023-01-06: 4 mg via ORAL
  Filled 2023-01-04: qty 1

## 2023-01-04 MED ORDER — HYDRALAZINE HCL 25 MG PO TABS
25.0000 mg | ORAL_TABLET | Freq: Three times a day (TID) | ORAL | Status: DC
  Administered 2023-01-04 – 2023-01-09 (×11): 25 mg via ORAL
  Filled 2023-01-04 (×12): qty 1

## 2023-01-04 MED ORDER — FUROSEMIDE 80 MG PO TABS: 80.0000 mg | ORAL_TABLET | ORAL | Status: DC

## 2023-01-04 MED ORDER — MELATONIN 5 MG PO TABS
5.0000 mg | ORAL_TABLET | Freq: Every day | ORAL | Status: DC
  Administered 2023-01-09 – 2023-01-13 (×3): 5 mg via ORAL
  Filled 2023-01-04 (×11): qty 1

## 2023-01-04 MED ORDER — HYDRALAZINE HCL 25 MG PO TABS: 25.0000 mg | ORAL_TABLET | Freq: Three times a day (TID) | ORAL | Status: DC

## 2023-01-04 MED ORDER — OXYCODONE HCL 5 MG PO TABS: 5.0000 mg | ORAL_TABLET | Freq: Four times a day (QID) | ORAL | 0 refills | Status: DC | PRN

## 2023-01-04 MED ORDER — PANTOPRAZOLE SODIUM 40 MG PO TBEC: 40.0000 mg | DELAYED_RELEASE_TABLET | Freq: Every day | ORAL | Status: DC

## 2023-01-04 MED ORDER — TRAZODONE HCL 50 MG PO TABS: 25.0000 mg | ORAL_TABLET | Freq: Every evening | ORAL | Status: DC | PRN

## 2023-01-04 MED ORDER — DOCUSATE SODIUM 100 MG PO CAPS: 100.0000 mg | ORAL_CAPSULE | Freq: Two times a day (BID) | ORAL | Status: DC

## 2023-01-04 MED ORDER — HYDROCERIN EX CREA
TOPICAL_CREAM | Freq: Three times a day (TID) | CUTANEOUS | Status: DC
  Filled 2023-01-04: qty 113

## 2023-01-04 MED ORDER — HYDROXYZINE HCL 25 MG PO TABS: 25.0000 mg | ORAL_TABLET | Freq: Three times a day (TID) | ORAL | Status: DC | PRN

## 2023-01-04 MED ORDER — INSULIN GLARGINE-YFGN 100 UNIT/ML ~~LOC~~ SOLN
20.0000 [IU] | Freq: Every day | SUBCUTANEOUS | Status: DC
  Administered 2023-01-05: 20 [IU] via SUBCUTANEOUS
  Filled 2023-01-04: qty 0.2

## 2023-01-04 MED ORDER — CLOTRIMAZOLE-BETAMETHASONE 1-0.05 % EX CREA
TOPICAL_CREAM | Freq: Two times a day (BID) | CUTANEOUS | Status: DC
  Filled 2023-01-04 (×3): qty 15

## 2023-01-04 MED ORDER — CALCIUM CARBONATE ANTACID 500 MG PO CHEW: 800.0000 mg | CHEWABLE_TABLET | Freq: Three times a day (TID) | ORAL | Status: DC | PRN

## 2023-01-04 MED ORDER — FLUTICASONE FUROATE-VILANTEROL 200-25 MCG/ACT IN AEPB
1.0000 | INHALATION_SPRAY | Freq: Every day | RESPIRATORY_TRACT | Status: DC
  Filled 2023-01-04: qty 28

## 2023-01-04 MED ORDER — ACETAMINOPHEN 325 MG PO TABS: 650.0000 mg | ORAL_TABLET | Freq: Four times a day (QID) | ORAL | Status: DC | PRN

## 2023-01-04 MED ORDER — ROSUVASTATIN CALCIUM 5 MG PO TABS
10.0000 mg | ORAL_TABLET | ORAL | Status: DC
  Administered 2023-01-06 – 2023-01-15 (×5): 10 mg via ORAL
  Filled 2023-01-04 (×5): qty 2

## 2023-01-04 MED ORDER — IPRATROPIUM-ALBUTEROL 0.5-2.5 (3) MG/3ML IN SOLN: 3.0000 mL | RESPIRATORY_TRACT | Status: DC | PRN

## 2023-01-04 MED ORDER — POLYETHYLENE GLYCOL 3350 17 G PO PACK: 17.0000 g | PACK | Freq: Every day | ORAL | Status: DC | PRN

## 2023-01-04 MED ORDER — GUAIFENESIN-DM 100-10 MG/5ML PO SYRP: 10.0000 mL | ORAL_SOLUTION | Freq: Four times a day (QID) | ORAL | Status: DC | PRN

## 2023-01-04 MED ORDER — BISACODYL 10 MG RE SUPP: 10.0000 mg | Freq: Every day | RECTAL | Status: DC | PRN

## 2023-01-04 MED ORDER — SORBITOL 70 % SOLN: 30.0000 mL | Freq: Every day | Status: DC | PRN

## 2023-01-04 MED ORDER — OMEGA-3-ACID ETHYL ESTERS 1 G PO CAPS
1.0000 g | ORAL_CAPSULE | Freq: Every day | ORAL | Status: DC
  Administered 2023-01-05 – 2023-01-18 (×14): 1 g via ORAL
  Filled 2023-01-04 (×14): qty 1

## 2023-01-04 MED ORDER — VITAMIN D 25 MCG (1000 UNIT) PO TABS
2000.0000 [IU] | ORAL_TABLET | Freq: Every day | ORAL | Status: DC
  Administered 2023-01-05 – 2023-01-18 (×14): 2000 [IU] via ORAL
  Filled 2023-01-04 (×15): qty 2

## 2023-01-04 MED ORDER — APIXABAN 2.5 MG PO TABS
2.5000 mg | ORAL_TABLET | Freq: Two times a day (BID) | ORAL | Status: DC
  Administered 2023-01-04 – 2023-01-18 (×28): 2.5 mg via ORAL
  Filled 2023-01-04 (×28): qty 1

## 2023-01-04 MED ORDER — RENA-VITE PO TABS
1.0000 | ORAL_TABLET | Freq: Every day | ORAL | Status: DC
  Administered 2023-01-04 – 2023-01-17 (×14): 1 via ORAL
  Filled 2023-01-04 (×14): qty 1

## 2023-01-04 MED ORDER — HEPARIN SODIUM (PORCINE) 1000 UNIT/ML IJ SOLN
1000.0000 [IU] | INTRAMUSCULAR | Status: DC | PRN
  Administered 2023-01-05: 1000 [IU] via INTRAVENOUS
  Administered 2023-01-09: 3200 [IU] via INTRAVENOUS
  Filled 2023-01-04 (×4): qty 1

## 2023-01-04 MED ORDER — ISOSORBIDE MONONITRATE ER 60 MG PO TB24: 60.0000 mg | ORAL_TABLET | Freq: Every day | ORAL | Status: DC

## 2023-01-04 MED ORDER — INSULIN ASPART 100 UNIT/ML IJ SOLN
3.0000 [IU] | Freq: Three times a day (TID) | INTRAMUSCULAR | Status: DC
  Administered 2023-01-04 – 2023-01-12 (×19): 3 [IU] via SUBCUTANEOUS

## 2023-01-04 NOTE — PMR Pre-admission (Signed)
PMR Admission Coordinator Pre-Admission Assessment  Patient: Kyle Roberson is an 82 y.o., male MRN: [id number] DOB: Oct 27, 1940 Height: 5\' 8"  (172.7 cm) Weight: 84.7 kg  Insurance Information HMO:     PPO: yes     PCP:      IPA:      80/20:      OTHER:  PRIMARY: [employer] Medicare      Policy#: [id number]       Subscriber: pt CM Name: Val      Phone#: not provided     Fax#: [phone] Pre-Cert#: [id number] auth for CIR from Val with [employer] with updates due to fax listed above on 01/09/2023   Employer:  Benefits:  Phone #: [phone]     Name:  Eff. Date: 03/23/22     Deduct: $100 (met)      Out of Pocket Max: $1400 (met)      Life Max:  CIR: 80%      SNF: $20/day for days 1-20 Outpatient: 80%     Co-Pay: 20% Home Health:       Co-Pay: $25/visit DME: 80%     Co-Pay: 20% Providers:  SECONDARY: [employer] State Health Plan      Policy#: [id number]     Phone#: [phone]  Financial Counselor:       Phone#:   The "Data Collection Information Summary" for patients in Inpatient Rehabilitation Facilities with attached "Privacy Act Statement-Health Care Records" was provided and verbally reviewed with: Patient and Family  Emergency Contact Information Contact Information     Name Relation Home Work Mobile   Pitman,[name] Spouse   [phone]      Other Contacts   None on File     Current Medical History  Patient Admitting Diagnosis: cardiac debility   History of Present Illness: Pt is an 82 y/o male with PMH of CKD, CHF, CAD, afib (on eliquis), pulmonary hypertension, and SDH s/p crani evacuation who admitted to [facility] on 11/26/22 with c/o increasing weight gain, bilat LE edema, and SOB.  His baseline weight is 220 lbs and in the ED was 255 lbs.  Creatine on admit was 2.85 compared to baseline 2.5.  He was diagnosed with acute on chronic CHF and started on lasix drip and cardiology consulted.  Echo this admission with EF 55-60%, mild LVH, moderate RV enlargement with mildly  decreased RV systolic function.  He was started on a milrinone drip 9/9.  RHC done showing elevated R>L filling pressures, mostly right-sided.  He had worsening renal function so was taken off lasix drip and started on CRRT 9/12 and transitioned to permanent HD on TRS schedule.  He resumed eliquis on 9/24.  Therapy ongoing and pt has been recommended for CIR>     Patient's medical record from [facility] has been reviewed by the rehabilitation admission coordinator and physician.  Past Medical History  Past Medical History:  Diagnosis Date   AKI (acute kidney injury) (HCC) 07/06/2018   CAD (coronary artery disease)    a. Remote PCI/stenting to LAD w PTCA Diagnoal. RCA 60%; b.  2005/2008 Cardiolites w/ reportedly mild ischemia in Diag territory-->Med rx.   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 04/2019 Echo: EF 55-60%, no rwma, nl RV fxn, RVSP 55.22mmHg. Mod dil LA. Mild-mod MR. Mod dil PA.   CKD (chronic kidney disease), stage IV (HCC)    Diabetes (HCC)    GERD (gastroesophageal reflux disease)  History of MI (myocardial infarction) 06/27/2014   HLD (hyperlipidemia)    HTN (hypertension)    PAH (pulmonary artery hypertension) (HCC)    a. 04/2019 Echo: RVSP 55.51mmHg.   Permanent atrial fibrillation (HCC)    a. CHA2DS2VASc = 5-->dose adjusted eliquis (age/creat).   Subdural hematoma (HCC)    a. 01/2021 in setting of fall s/p L frontotemporal craniotomy.    Has the patient had major surgery during 100 days prior to admission? Yes  Family History   family history includes CAD in his father; Diabetes in his brother; Pancreatic cancer in his mother.  Current Medications  Current Facility-Administered Medications:    0.9 %  sodium chloride infusion, 250 mL, Intravenous, PRN, Wyn Quaker, Marlow Baars, MD   acetaminophen (TYLENOL) tablet 650 mg, 650 mg, Oral, Q6H PRN, 650 mg at 12/27/22 2136 **OR** acetaminophen (TYLENOL)  suppository 650 mg, 650 mg, Rectal, Q6H PRN, Dew, Marlow Baars, MD   apixaban (ELIQUIS) tablet 2.5 mg, 2.5 mg, Oral, BID, Dew, Marlow Baars, MD, 2.5 mg at 01/04/23 4098   ascorbic acid (VITAMIN C) tablet 500 mg, 500 mg, Oral, BID, Annice Needy, MD, 500 mg at 01/04/23 1191   bisacodyl (DULCOLAX) suppository 10 mg, 10 mg, Rectal, Daily PRN, Rosezetta Schlatter T, MD, 10 mg at 01/01/23 1428   calcium carbonate (TUMS - dosed in mg elemental calcium) chewable tablet 800 mg of elemental calcium, 800 mg of elemental calcium, Oral, TID WC PRN, Lindajo Royal V, MD, 800 mg of elemental calcium at 12/23/22 0926   Chlorhexidine Gluconate Cloth 2 % PADS 6 each, 6 each, Topical, Daily, Annice Needy, MD, 6 each at 01/03/23 0949   cholecalciferol (VITAMIN D3) 25 MCG (1000 UNIT) tablet 2,000 Units, 2,000 Units, Oral, Daily, Annice Needy, MD, 2,000 Units at 01/04/23 4782   clotrimazole-betamethasone (LOTRISONE) cream, , Topical, BID, Annice Needy, MD, Given at 01/04/23 9562   diphenhydrAMINE-zinc acetate (BENADRYL) 2-0.1 % cream, , Topical, BID PRN, Annice Needy, MD, Given at 12/22/22 1025   docusate sodium (COLACE) capsule 100 mg, 100 mg, Oral, BID, Annice Needy, MD, 100 mg at 01/04/23 1308   doxazosin (CARDURA) tablet 1 mg, 1 mg, Oral, BID, Dew, Marlow Baars, MD, 1 mg at 01/04/23 0957   fluticasone furoate-vilanterol (BREO ELLIPTA) 200-25 MCG/ACT 1 puff, 1 puff, Inhalation, Daily, Gillis Santa, MD, 1 puff at 01/04/23 0940   furosemide (LASIX) tablet 80 mg, 80 mg, Oral, Q M,W,F, Dew, Marlow Baars, MD, 80 mg at 01/04/23 6578   heparin sodium (porcine) injection 1,000 Units, 1,000 Units, Intravenous, PRN, Wendee Beavers, NP, 1,000 Units at 12/29/22 1341   hydrALAZINE (APRESOLINE) tablet 25 mg, 25 mg, Oral, TID, Brynda Peon L, NP, 25 mg at 01/04/23 4696   hydrOXYzine (ATARAX) tablet 25 mg, 25 mg, Oral, TID PRN, Gillis Santa, MD   insulin aspart (novoLOG) injection 0-15 Units, 0-15 Units, Subcutaneous, TID AC & HS, Dew, Marlow Baars, MD, 2 Units at  01/04/23 0005   insulin aspart (novoLOG) injection 3 Units, 3 Units, Subcutaneous, TID WC, Annice Needy, MD, 3 Units at 01/04/23 0938   insulin glargine-yfgn (SEMGLEE) injection 20 Units, 20 Units, Subcutaneous, Daily, Gillis Santa, MD, 20 Units at 01/04/23 0939   ipratropium-albuterol (DUONEB) 0.5-2.5 (3) MG/3ML nebulizer solution 3 mL, 3 mL, Nebulization, Q4H PRN, Sreeram, Narendranath, MD   isosorbide mononitrate (IMDUR) 24 hr tablet 60 mg, 60 mg, Oral, Daily, Laurey Morale, MD, 60 mg at 01/04/23 0938   morphine (PF) 2 MG/ML injection  2 mg, 2 mg, Intravenous, Q2H PRN, Andris Baumann, MD   multivitamin (RENA-VIT) tablet 1 tablet, 1 tablet, Oral, QHS, Dew, Marlow Baars, MD, 1 tablet at 01/03/23 2223   neomycin-polymyxin b-dexamethasone (MAXITROL) ophthalmic ointment 1 Application, 1 Application, Right Eye, TID, Annice Needy, MD, 1 Application at 01/04/23 1002   nitroGLYCERIN (NITROSTAT) SL tablet 0.4 mg, 0.4 mg, Sublingual, Q5 min PRN, Lindajo Royal V, MD, 0.4 mg at 12/20/22 2201   omega-3 acid ethyl esters (LOVAZA) capsule 1 g, 1 g, Oral, Daily, Dew, Marlow Baars, MD, 1 g at 01/04/23 0938   ondansetron (ZOFRAN) tablet 4 mg, 4 mg, Oral, Q6H PRN **OR** ondansetron (ZOFRAN) injection 4 mg, 4 mg, Intravenous, Q6H PRN, Annice Needy, MD, 4 mg at 12/01/22 1451   oxyCODONE (Oxy IR/ROXICODONE) immediate release tablet 5 mg, 5 mg, Oral, Q6H PRN, Annice Needy, MD, 5 mg at 12/22/22 2135   [COMPLETED] pantoprazole (PROTONIX) injection 40 mg, 40 mg, Intravenous, Q12H, 40 mg at 12/22/22 2135 **FOLLOWED BY** pantoprazole (PROTONIX) EC tablet 40 mg, 40 mg, Oral, Daily, Gillis Santa, MD, 40 mg at 01/04/23 8469   polyethylene glycol (MIRALAX / GLYCOLAX) packet 17 g, 17 g, Oral, Daily PRN, Annice Needy, MD   rosuvastatin (CRESTOR) tablet 10 mg, 10 mg, Oral, Once per day on Monday Wednesday Friday, Wyn Quaker, Marlow Baars, MD, 10 mg at 01/04/23 6295   simethicone (MYLICON) 40 MG/0.6ML suspension 40 mg, 40 mg, Oral, Q6H PRN, Gillis Santa, MD   sodium chloride flush (NS) 0.9 % injection 3 mL, 3 mL, Intravenous, Q12H, Dew, Marlow Baars, MD, 3 mL at 01/03/23 2232   sodium chloride flush (NS) 0.9 % injection 3 mL, 3 mL, Intravenous, PRN, Annice Needy, MD   traZODone (DESYREL) tablet 25 mg, 25 mg, Oral, QHS PRN, Annice Needy, MD, 25 mg at 12/27/22 2135  Patients Current Diet:  Diet Order             Diet renal with fluid restriction Fluid consistency: Thin  Diet effective now                   Precautions / Restrictions Precautions Precautions: Fall Restrictions Weight Bearing Restrictions: No RLE Weight Bearing: Weight bearing as tolerated LLE Weight Bearing: Weight bearing as tolerated Other Position/Activity Restrictions: does not wear CAM boot for mobility today   Has the patient had 2 or more falls or a fall with injury in the past year? No  Prior Activity Level Household: no DME in the house, mod I for mobility, assist for ADLs, doesn't drive, uses quad cane in the community  Prior Functional Level Self Care: Did the patient need help bathing, dressing, using the toilet or eating? Needed some help  Indoor Mobility: Did the patient need assistance with walking from room to room (with or without device)? Independent  Stairs: Did the patient need assistance with internal or external stairs (with or without device)? Needed some help  Functional Cognition: Did the patient need help planning regular tasks such as shopping or remembering to take medications? Independent  Patient Information Are you of Hispanic, Latino/a,or Spanish origin?: A. No, not of Hispanic, Latino/a, or Spanish origin What is your race?: A. White Do you need or want an interpreter to communicate with a doctor or health care staff?: 0. No  Patient's Response To:  Health Literacy and Transportation Is the patient able to respond to health literacy and transportation needs?: Yes Health Literacy - How often do  you need to have someone  help you when you read instructions, pamphlets, or other written material from your doctor or pharmacy?: Never In the past 12 months, has lack of transportation kept you from medical appointments or from getting medications?: No In the past 12 months, has lack of transportation kept you from meetings, work, or from getting things needed for daily living?: No  Home Assistive Devices / Equipment Home Assistive Devices/Equipment: Environmental consultant (specify type), Wheelchair, Grab bars in shower, [employer] Home Equipment: [employer] - quad, Shower seat, Grab bars - toilet, Grab bars - tub/shower, Hand held shower head, Wheelchair - manual, Rollator (4 wheels)  Prior Device Use: Indicate devices/aids used by the patient prior to current illness, exacerbation or injury?  Quad cane  Current Functional Level Cognition  Overall Cognitive Status: Within Functional Limits for tasks assessed Orientation Level: Oriented X4 General Comments: pleasant and motivated to participate in PT    Extremity Assessment (includes Sensation/Coordination)  Upper Extremity Assessment: Generalized weakness  Lower Extremity Assessment: Generalized weakness, LLE deficits/detail LLE Deficits / Details: ankle pain with WBing continues LLE: Unable to fully assess due to pain    ADLs  Overall ADL's : Needs assistance/impaired Grooming: Sitting, Wash/dry hands, Wash/dry face, Set up, Supervision/safety Lower Body Bathing: Sitting/lateral leans, Moderate assistance, Maximal assistance Toilet Transfer: +2 for physical assistance, BSC/3in1, Stand-pivot, Rolling walker (2 wheels) Toilet Transfer Details (indicate cue type and reason): anticipated Toileting- Clothing Manipulation and Hygiene: Maximal assistance, Sit to/from stand Functional mobility during ADLs: Rolling walker (2 wheels), Minimal assistance, Moderate assistance General ADL Comments: MOD A + RW for BSC t/f    Mobility  Overal bed mobility: Needs Assistance Bed Mobility:  Supine to Sit Rolling: Min assist Sidelying to sit: Min assist Supine to sit: Mod assist, +2 for physical assistance Sit to supine: Contact guard assist General bed mobility comments: verbal cues for taks initiation and sequencing. increased time and effort required    Transfers  Overall transfer level: Needs assistance Equipment used: Rolling walker (2 wheels) Transfers: Sit to/from Stand Sit to Stand: Min assist, Mod assist Bed to/from chair/wheelchair/BSC transfer type:: Step pivot Step pivot transfers: Min assist  Lateral/Scoot Transfers: Max assist, +2 physical assistance General transfer comment: transfer assist varried during session    Ambulation / Gait / Stairs / Psychologist, prison and probation services  Ambulation/Gait Ambulation/Gait assistance: Editor, commissioning (Feet): 45 Feet Assistive device: Rolling walker (2 wheels) Gait Pattern/deviations: Step-through pattern, Decreased step length - left, Decreased step length - right General Gait Details: 45' x 2 with seated rest break.  no chair follow needed for in room but would benefit from it in hallway but he declined to go out of room Gait velocity: decreased Gait velocity interpretation: <1.31 ft/sec, indicative of household ambulator Pre-gait activities: marching in place performed with Min A +2 using rolling walker for 5 seconds x 2 bouts. emphasis on upright standing posture, hip extension and avoid flexed posture, increasing standing tolerance in preparation for standing. side steps performed to the right with Min A +2 person required for safety    Posture / Balance Dynamic Sitting Balance Sitting balance - Comments: able to maintain seated EOB balance during functional activities requiring CGA but able to progress to close sup A Balance Overall balance assessment: Needs assistance Sitting-balance support: Feet supported, Single extremity supported Sitting balance-Leahy Scale: Good Sitting balance - Comments: able to maintain  seated EOB balance during functional activities requiring CGA but able to progress to close sup A Postural control: Posterior lean  Standing balance support: Bilateral upper extremity supported, Reliant on assistive device for balance Standing balance-Leahy Scale: Fair Standing balance comment: external support required    Special needs/care consideration Dialysis: Hemodialysis Tuesday, Thursday, and Saturday and Diabetic management yes   Previous Home Environment (from acute therapy documentation) Living Arrangements: Spouse/significant other Available Help at Discharge: Family, Available PRN/intermittently (wife works outside the home) Type of Home: [employer] Home Layout: One level Home Access: Ramped entrance Bathroom Shower/Tub: Psychologist, counselling, Nurse, adult Toilet: Handicapped height Bathroom Accessibility: Yes How Accessible: Accessible via wheelchair Home Care Services: No Additional Comments: lift chair  Discharge Living Setting Plans for Discharge Living Setting: Patient's home, Lives with (comment) (spouse ([name])) Type of Home at Discharge: House Discharge Home Layout: One level, Laundry or work area in basement Discharge Home Access: Ramped entrance Discharge Bathroom Shower/Tub: Walk-in shower, Tub/shower unit Discharge Bathroom Toilet: Handicapped height Discharge Bathroom Accessibility: Yes How Accessible: Accessible via wheelchair Does the patient have any problems obtaining your medications?: No  Social/Family/Support Systems Anticipated Caregiver: Harvel Quale Anticipated Caregiver's Contact Information: [phone] Ability/Limitations of Caregiver: none stated Caregiver Availability: 24/7 Discharge Plan Discussed with Primary Caregiver: Yes Is Caregiver In Agreement with Plan?: Yes Does Caregiver/Family have Issues with Lodging/Transportation while Pt is in Rehab?: No  Goals Patient/Family Goal for Rehab: PT/OT supervision to min assist, SLP  supervision Expected length of stay: 18-21 days Additional Information: Discharge plan: return to pt's home with spouse, [name], providing 24/7 assist Pt/Family Agrees to Admission and willing to participate: Yes Program Orientation Provided & Reviewed with Pt/Caregiver Including Roles  & Responsibilities: Yes  Barriers to Discharge: Insurance for SNF coverage  Decrease burden of Care through IP rehab admission: no  Possible need for SNF placement upon discharge: Not anticipated.  Discharge plan: return home with patient's spouse to provide 24/7 support as with previous admission to CIR in 2022.   Patient Condition: I have reviewed medical records from [facility], spoken with CSW, and spouse. I discussed via phone for inpatient rehabilitation assessment.  Patient will benefit from ongoing PT, OT, and SLP, can actively participate in 3 hours of therapy a day 5 days of the week, and can make measurable gains during the admission.  Patient will also benefit from the coordinated team approach during an Inpatient Acute Rehabilitation admission.  The patient will receive intensive therapy as well as Rehabilitation physician, nursing, social worker, and care management interventions.  Due to bladder management, bowel management, safety, skin/wound care, disease management, medication administration, pain management, and patient education the patient requires 24 hour a day rehabilitation nursing.  The patient is currently mod assist +2 with mobility and basic ADLs.  Discharge setting and therapy post discharge at home with home health is anticipated.  Patient has agreed to participate in the Acute Inpatient Rehabilitation Program and will admit today  Preadmission Screen Completed By:  Erick Colace, PT, DPT 01/04/2023 10:37 AM ______________________________________________________________________   Discussed status with Dr. Wynn Banker on 01/04/23  at 10:37 AM  and received approval for admission  today.  Admission Coordinator:  Erick Colace, MD, DPT time 10:37 AM Dorna Bloom 01/04/23    Assessment/Plan: Diagnosis:debility after CHF exacerbation Does the need for close, 24 hr/day Medical supervision in concert with the patient's rehab needs make it unreasonable for this patient to be served in a less intensive setting? Yes Co-Morbidities requiring supervision/potential complications: acute on chronic renal failure now on HD, Afib, CAD  Due to bladder management, bowel management, safety, skin/wound care, disease management, medication  administration, pain management, and patient education, does the patient require 24 hr/day rehab nursing? Yes Does the patient require coordinated care of a physician, rehab nurse, PT, OT, and SLP to address physical and functional deficits in the context of the above medical diagnosis(es)? Yes Addressing deficits in the following areas: balance, endurance, locomotion, strength, transferring, bowel/bladder control, bathing, dressing, toileting, cognition, and psychosocial support Can the patient actively participate in an intensive therapy program of at least 3 hrs of therapy 5 days a week? Yes The potential for patient to make measurable gains while on inpatient rehab is fair Anticipated functional outcomes upon discharge from inpatient rehab: supervision and min assist PT, supervision and min assist OT, supervision SLP Estimated rehab length of stay to reach the above functional goals is: 18-21d Anticipated discharge destination: Home 10. Overall Rehab/Functional Prognosis: fair   MD Signature: Erick Colace M.D. [facility] Fellow Am Acad of Phys Med and Rehab Diplomate Am Board of Electrodiagnostic Med Fellow Am Board of Interventional Pain

## 2023-01-04 NOTE — Discharge Summary (Signed)
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     Diagnosed with acute on chronic CHF and started on Lasix drip.  Cardiology was consulted. Echo this admission with EF 55-60%, mild LVH, moderate RV enlargement with mildly decreased RV systolic function, PASP 73 mmHg, severe LAE, mild-moderate TR, IVC dilated.  RHC done on 09/11 with elevated R > L filling pressures, mostly right-sided, worsening renal function and decreased UOP so Lasix stopped.  Started on CRRT 09/12 for additional volume removal and completed this. 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  Was on heparin gtt for Afib, has been bradycardic w/ ventricular rates 40-50s but go into 60-70 range on standing. Resumed Eliquis 09/24 Permcath placed 12/14/22 and currently on TTS hemodialysis schedule.. Patient is now cleared for discharge to rehab and to follow-up with nephrologist    Consultants: Nephrology Procedures  performed: As mentioned above Disposition: Rehabilitation facility Diet recommendation:  Renal diet DISCHARGE MEDICATION: Allergies as of 01/04/2023       Reactions   Atorvastatin Hives, Itching, Other (See Comments)   Other reaction(s): Other (See Comments)   Simvastatin Hives, Itching, Other (See Comments)   Other reaction(s): Other (See Comments)        Medication List     STOP taking these medications    FLAXSEED OIL PO   QC TUMERIC COMPLEX PO   QUNOL ULTRA COQ10 PO       TAKE these medications    acetaminophen 325 MG tablet Commonly known as: TYLENOL Take 2 tablets (650 mg total) by mouth every 6 (six) hours as needed for mild pain (or Fever >/= 101).   apixaban 2.5 MG Tabs tablet Commonly known as: Eliquis Take 1 tablet (2.5 mg total) by mouth 2 (two) times daily.   bisacodyl 10 MG suppository Commonly known as: DULCOLAX Place 1 suppository (10 mg total) rectally daily as needed for moderate constipation.   calcium carbonate 500 MG chewable tablet Commonly known as: TUMS - dosed in mg elemental calcium Chew 4 tablets (800 mg of elemental calcium total) by mouth 3 (three) times daily with meals as needed for indigestion or heartburn.   docusate sodium 100 MG capsule Commonly known as: COLACE Take 1 capsule (100 mg total) by mouth 2 (two) times daily.   doxazosin 1 MG tablet Commonly known as: CARDURA TAKE 1 TABLET(1 MG) BY MOUTH TWICE DAILY   FISH OIL PO Take 1 capsule by mouth in the morning.   fluticasone furoate-vilanterol 200-25 MCG/ACT Aepb Commonly known as: BREO ELLIPTA Inhale 1 puff into the lungs daily. Start taking on: January 05, 2023  furosemide 80 MG tablet Commonly known as: LASIX Take 1 tablet (80 mg total) by mouth every Monday, Wednesday, and Friday. Start taking on: January 06, 2023 What changed:  medication strength how much to take how to take this when to take this additional instructions Another medication with the  same name was removed. Continue taking this medication, and follow the directions you see here.   glipiZIDE 5 MG 24 hr tablet Commonly known as: GLUCOTROL XL Take 1 tablet (5 mg total) by mouth daily with breakfast. What changed: how much to take   hydrALAZINE 25 MG tablet Commonly known as: APRESOLINE Take 1 tablet (25 mg total) by mouth 3 (three) times daily. What changed:  medication strength See the new instructions.   hydrOXYzine 25 MG tablet Commonly known as: ATARAX Take 1 tablet (25 mg total) by mouth 3 (three) times daily as needed for itching (symptoms unresponsive to topical cream).   ipratropium-albuterol 0.5-2.5 (3) MG/3ML Soln Commonly known as: DUONEB Take 3 mLs by nebulization every 4 (four) hours as needed.   isosorbide mononitrate 60 MG 24 hr tablet Commonly known as: IMDUR Take 1 tablet (60 mg total) by mouth daily. Start taking on: January 05, 2023 What changed:  medication strength how much to take when to take this   multivitamin with minerals Tabs tablet Take 1 tablet by mouth in the morning.   neomycin-polymyxin b-dexamethasone 3.5-10000-0.1 Oint Commonly known as: MAXITROL Place 1 Application into the right eye 3 (three) times daily.   nitroGLYCERIN 0.4 MG/SPRAY spray Commonly known as: NITROLINGUAL Place 1 spray under the tongue as directed.   ondansetron 4 MG tablet Commonly known as: ZOFRAN Take 1 tablet (4 mg total) by mouth every 6 (six) hours as needed for nausea.   oxyCODONE 5 MG immediate release tablet Commonly known as: Oxy IR/ROXICODONE Take 1 tablet (5 mg total) by mouth every 6 (six) hours as needed for moderate pain.   pantoprazole 40 MG tablet Commonly known as: PROTONIX Take 1 tablet (40 mg total) by mouth daily. Start taking on: January 05, 2023   polyethylene glycol 17 g packet Commonly known as: MIRALAX / GLYCOLAX Take 17 g by mouth daily as needed for mild constipation.   rosuvastatin 10 MG tablet Commonly known  as: CRESTOR Take 1 tablet (10 mg total) by mouth 3 (three) times a week.   traZODone 50 MG tablet Commonly known as: DESYREL Take 0.5 tablets (25 mg total) by mouth at bedtime as needed for sleep.   VITAMIN C PO Take 1 tablet by mouth in the morning.   Vitamin D3 1.25 MG (50000 UT) Caps Take 1 tablet by mouth in the morning.        Follow-up Information     P[place]on, [place] F, MD Follow up in 2 week(s).   Specialty: General Surgery Contact information: [address] [place] [address] [phone]         [facility] Follow up on 01/01/2023.   Specialty: Cardiology Why: Follow up in the Advanced Heart Failure clinic 01/01/23 at 9:15 am Contact information: [address] [place] [address] [phone]               Discharge Exam: Ceasar Mons Weights   01/01/23 0348 01/02/23 0400 01/02/23 1253  Weight: 90 kg 92 kg 84.7 kg   General - Elderly obese  Caucasian male in no acute distress HEENT - PERRLA, EOMI, atraumatic head, non tender  sinuses. Lung - Clear, bibasal rales, rhonchi, no wheezes. Heart - S1, S2 heard, no murmurs, rubs, 2+  pedal edema. Abdomen - Soft, non tender, non distended, bowel sounds good Neuro - Alert, awake and oriented x 3, non focal exam. Skin - Warm and dry. Chronic lower extremity skin changes, lymphedema    Condition at discharge: good  The results of significant diagnostics from this hospitalization (including imaging, microbiology, ancillary and laboratory) are listed below for reference.   Imaging Studies: DG Chest Port 1 View  Result Date: 12/20/2022 CLINICAL DATA:  Chest pain EXAM: PORTABLE CHEST 1 VIEW COMPARISON:  12/18/2022 FINDINGS: Shallow inspiration. Cardiac enlargement with mild pulmonary vascular congestion. Perihilar infiltration suggesting edema. No focal consolidation. Haziness in the costophrenic angles may indicate small  pleural effusions. No pneumothorax. Right central venous catheter with tip over the SVC region. Calcification of the aorta. IMPRESSION: Cardiac enlargement with pulmonary vascular congestion and perihilar edema. Probable small pleural effusions. Electronically Signed   By: Burman Nieves M.D.   On: 12/20/2022 22:44   DG Chest Port 1 View  Result Date: 12/18/2022 CLINICAL DATA:  Shortness of breath. EXAM: PORTABLE CHEST 1 VIEW COMPARISON:  December 03, 2022. FINDINGS: Stable cardiomegaly. Right internal jugular dialysis catheter is in good position. Left lung is clear. Mild right basilar atelectasis and small pleural effusion is noted. Bony thorax is unremarkable. IMPRESSION: Mild right basilar atelectasis and small right pleural effusion. Electronically Signed   By: Lupita Raider M.D.   On: 12/18/2022 08:48   PERIPHERAL VASCULAR CATHETERIZATION  Result Date: 12/16/2022 See surgical note for result.  PERIPHERAL VASCULAR CATHETERIZATION  Result Date: 12/14/2022 See surgical note for result.   Microbiology: Results for orders placed or performed during the hospital encounter of 11/26/22  MRSA Next Gen by PCR, Nasal     Status: None   Collection Time: 11/30/22  4:10 PM   Specimen: Nasal Mucosa; Nasal Swab  Result Value Ref Range Status   MRSA by PCR Next Gen NOT DETECTED NOT DETECTED Final    Comment: (NOTE) The GeneXpert MRSA Assay (FDA approved for NASAL specimens only), is one component of a comprehensive MRSA colonization surveillance program. It is not intended to diagnose MRSA infection nor to guide or monitor treatment for MRSA infections. Test performance is not FDA approved in patients less than 37 years old. Performed at [facility], [address] [place], [place] [address]     Labs: CBC: Recent Labs  Lab 12/31/22 0604 01/01/23 0620 01/02/23 0522 01/04/23 0505  WBC 9.6 9.3 9.7 10.2  NEUTROABS 7.2 7.0 7.1 7.3  HGB 11.4* 11.3* 10.9* 11.6*  HCT 34.3*  34.9* 32.7* 34.7*  MCV 90.0 91.6 89.8 89.4  PLT 216 205 199 222   Basic Metabolic Panel: Recent Labs  Lab 12/31/22 0603 12/31/22 0604 01/01/23 0621 01/02/23 0522 01/02/23 0523 01/03/23 0432 01/04/23 0505  NA 136   < > 138 138 136 137 140  K 3.8   < > 3.8 3.9 3.8 3.7 4.1  CL 97*   < > 97* 98 98 97* 98  CO2 23   < > 27 23 23 25 23   GLUCOSE 88   < > 83 113* 113* 80 66*  BUN 72*   < > 42* 67* 65* 43* 69*  CREATININE 5.46*   < > 3.69* 5.23* 5.32* 3.82* 5.71*  CALCIUM 8.1*   < > 8.2* 8.0* 7.9* 8.3* 8.4*  PHOS 6.4*  --  4.7*  --  6.1*  4.8* 7.2*   < > = values in this interval not displayed.   Liver Function Tests: Recent Labs  Lab 12/31/22 0603 01/01/23 0621 01/02/23 0523 01/03/23 0432 01/04/23 0505  ALBUMIN 3.2* 3.3* 3.1* 3.3* 3.1*   CBG: Recent Labs  Lab 01/03/23 1126 01/03/23 1457 01/03/23 2313 01/04/23 0851 01/04/23 1156  GLUCAP 217* 173* 132* 87 173*    Discharge time spent:  37 minutes.  Signed: Loyce Dys, MD [facility] 01/04/2023

## 2023-01-04 NOTE — Plan of Care (Signed)
  Problem: Metabolic: Goal: Ability to maintain appropriate glucose levels will improve Outcome: Progressing   Problem: Education: Goal: Knowledge of General Education information will improve Description: Including pain rating scale, medication(s)/side effects and non-pharmacologic comfort measures Outcome: Progressing   Problem: Clinical Measurements: Goal: Will remain free from infection Outcome: Progressing Goal: Respiratory complications will improve Outcome: Progressing Goal: Cardiovascular complication will be avoided Outcome: Progressing   Problem: Elimination: Goal: Will not experience complications related to bowel motility Outcome: Progressing Goal: Will not experience complications related to urinary retention Outcome: Progressing   Problem: Pain Managment: Goal: General experience of comfort will improve Outcome: Progressing   Problem: Health Behavior/Discharge Planning: Goal: Ability to manage health-related needs will improve Outcome: Not Progressing   Problem: Skin Integrity: Goal: Risk for impaired skin integrity will decrease Outcome: Not Progressing

## 2023-01-04 NOTE — Care Management Important Message (Signed)
Important Message  Patient Details  Name: Drayce Tawil MRN: [id number] Date of Birth: 02/16/1941   Important Message Given:  Yes - Medicare IM     Olegario Messier A Markea Ruzich 01/04/2023, 11:07 AM

## 2023-01-04 NOTE — Progress Notes (Signed)
Inpatient Rehab Admissions Coordinator:   I have insurance approval for CIR.  Will update on potential admit today by 10 AM.    Estill Dooms, PT, DPT Admissions Coordinator [phone] 01/04/23  8:53 AM

## 2023-01-04 NOTE — Progress Notes (Signed)
Inpatient Rehab Admissions Coordinator:   I have a bed available for pt to admit today.  Dr. Meriam Sprague in agreement and [facility] aware.  Will update pts spouse and call for [facility] pickup once bed assignment made.    Estill Dooms, PT, DPT Admissions Coordinator [phone] 01/04/23  9:42 AM

## 2023-01-04 NOTE — H&P (Signed)
Physical Medicine and Rehabilitation Admission H&P     CC: Functional deficits secondary to debility, CHF, ESRD   HPI: Kyle Roberson is an 82 year old male who presented to the emergency department on 11/26/2022 to been evaluated by his nephrologist for volume overload.  He was admitted to the hospitalist service for acute on chronic diastolic heart failure started on Lasix.  2D echo was performed on 9/06 that revealed a left ventricular ejection fraction of 55 to 60% with normal left ventricle function and normal right ventricle function.  He had severely elevated pulmonary arterial systolic pressure.  He was seen in consultation that day by Dr. Elwyn Lade with failure team.  His past medical history is significant for chronic diastolic congestive heart failure, coronary artery disease status post remote PCI to the LAD and diagonal, chronic kidney disease stage IV, hypertension, hyperlipidemia, permanent atrial fibrillation, traumatic subdural hematoma status post craniotomy in November 2022.  He is maintained on Eliquis 2.5 mg twice daily dosed appropriate for age and renal function.  Nephrology was consulted on 9/07.  Patient's cardiologist Dr. Mariah Milling was also consulted.  His urine output significantly dropped on 9/11 and BUN and creatinine continued upward trend.  He remained in rate controlled atrial fibrillation.  Rate averaging 40 to 50 bpm with some overnight bradycardia 30s.  He developed cardiorenal syndrome and cardiogenic shock.  Converted to heparin infusion.  He underwent placement of dialysis catheter initiation of CRRT.  Palliative care consultation obtained.  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  Ultimately transition to intermittent hemodialysis and is now on a Tuesday, Thursday, Saturday schedule.  Eliquis was resumed on 9/24.  He underwent placement of tunneled hemodialysis catheter via the right IJ by Dr.  Wyn Quaker on 9/25. Per cardiology, no role for PPM currently in light of need for HD and additional nidus for infection. Patient ambulated 36ft with Mod A initially, progressing to Min A with increased ambulation distance using rolling walker. The patient requires inpatient medicine and rehabilitation evaluations and services for ongoing dysfunction secondary to debility.     Review of Systems  Constitutional:  Negative for chills and fever.  HENT:  Negative for nosebleeds.   Eyes:  Positive for redness.  Respiratory:  Negative for shortness of breath, wheezing and stridor.   Cardiovascular:  Negative for chest pain and leg swelling.  Gastrointestinal:  Negative for nausea and vomiting.  Genitourinary:  Negative for flank pain.  Musculoskeletal:  Positive for falls.       Hx fall at home with SDH ~12yrs ago  Skin:  Positive for rash.  Neurological:  Positive for weakness. Negative for sensory change and headaches.  Endo/Heme/Allergies:  Bruises/bleeds easily.  Psychiatric/Behavioral:  Negative for hallucinations and substance abuse.         Past Medical History:  Diagnosis Date   AKI (acute kidney injury) (HCC) 07/06/2018   CAD (coronary artery disease)      a. Remote PCI/stenting to LAD w PTCA Diagnoal. RCA 60%; b.  2005/2008 Cardiolites w/ reportedly mild ischemia in Diag territory-->Med rx.   Cellulitis of lower extremity 05/31/2019   Chronic heart failure with preserved ejection fraction (HFpEF) (HCC)      a. 04/2019 Echo: EF 55-60%, no rwma, nl RV fxn, RVSP 55.81mmHg. Mod dil LA. Mild-mod MR. Mod dil PA.   CKD (chronic kidney disease), stage IV (HCC)     Diabetes (HCC)     GERD (gastroesophageal reflux disease)  History of MI (myocardial infarction) 06/27/2014   HLD (hyperlipidemia)     HTN (hypertension)     PAH (pulmonary artery hypertension) (HCC)      a. 04/2019 Echo: RVSP 55.79mmHg.   Permanent atrial fibrillation (HCC)      a. CHA2DS2VASc = 5-->dose adjusted eliquis  (age/creat).   Subdural hematoma (HCC)      a. 01/2021 in setting of fall s/p L frontotemporal craniotomy.             Past Surgical History:  Procedure Laterality Date   BLEPHAROPLASTY       CARDIAC CATHETERIZATION       CORONARY STENT INTERVENTION       CRANIOTOMY Left 02/04/2021    Procedure: CRANIOTOMY FOR LEFT SUBDURAL  HEMATOMA EVACUATION;  Surgeon: Lucy Chris, MD;  Location: [facility] ORS;  Service: Neurosurgery;  Laterality: Left;   CYSTOSCOPY       DIALYSIS/PERMA CATHETER INSERTION N/A 12/14/2022    Procedure: DIALYSIS/PERMA CATHETER INSERTION;  Surgeon: Annice Needy, MD;  Location: [facility] INVASIVE CV LAB;  Service: Cardiovascular;  Laterality: N/A;   DIALYSIS/PERMA CATHETER REPAIR N/A 12/16/2022    Procedure: DIALYSIS/PERMA CATHETER REPAIR;  Surgeon: Annice Needy, MD;  Location: [facility] INVASIVE CV LAB;  Service: Cardiovascular;  Laterality: N/A;   HERNIA REPAIR       RIGHT HEART CATH N/A 12/02/2022    Procedure: RIGHT HEART CATH;  Surgeon: Laurey Morale, MD;  Location: [facility] INVASIVE CV LAB;  Service: Cardiovascular;  Laterality: N/A;             Family History  Problem Relation Age of Onset   Pancreatic cancer Mother     CAD Father     Diabetes Brother          Social History:  reports that he has quit smoking. He has never used smokeless tobacco. He reports that he does not currently use alcohol after a past usage of about 2.0 standard drinks of alcohol per week. He reports that he does not currently use drugs. Allergies:  Allergies       Allergies  Allergen Reactions   Atorvastatin Hives, Itching and Other (See Comments)      Other reaction(s): Other (See Comments)   Simvastatin Hives, Itching and Other (See Comments)      Other reaction(s): Other (See Comments)            Medications Prior to Admission  Medication Sig Dispense Refill   apixaban (ELIQUIS) 2.5 MG TABS tablet Take 1 tablet (2.5 mg total) by mouth 2 (two) times daily. 180 tablet 1   Ascorbic Acid  (VITAMIN C PO) Take 1 tablet by mouth in the morning.       Cholecalciferol (VITAMIN D3) 1.25 MG (50000 UT) CAPS Take 1 tablet by mouth in the morning.       Coenzyme Q10-Vitamin E (QUNOL ULTRA COQ10 PO) Take 1 capsule by mouth in the morning.       doxazosin (CARDURA) 1 MG tablet TAKE 1 TABLET(1 MG) BY MOUTH TWICE DAILY 60 tablet 1   Flaxseed, Linseed, (FLAXSEED OIL PO) Take 1 capsule by mouth in the morning.       Furosemide (FUROSCIX) 80 MG/10ML CTKT Inject 80 mg into the skin as directed. 5 each 0   furosemide (LASIX) 40 MG tablet Take 1 tablet daily every other day, alternating with 2 tabs daily every other day 45 tablet 3   glipiZIDE (GLUCOTROL XL) 5 MG 24 hr tablet Take 1  tablet (5 mg total) by mouth daily with breakfast. (Patient taking differently: Take 10 mg by mouth daily with breakfast.) 90 tablet 1   hydrALAZINE (APRESOLINE) 100 MG tablet TAKE 1 TABLET BY MOUTH THREE TIMES DAILY, WITH EXTRA 100 MG AS NEEDED FOR HIGH PRESSURE 90 tablet 1   isosorbide mononitrate (IMDUR) 30 MG 24 hr tablet Take 1 tablet (30 mg total) by mouth in the morning, at noon, and at bedtime. 90 tablet 6   Multiple Vitamin (MULTIVITAMIN WITH MINERALS) TABS tablet Take 1 tablet by mouth in the morning.       neomycin-polymyxin b-dexamethasone (MAXITROL) 3.5-10000-0.1 OINT Place 1 Application into the right eye 3 (three) times daily.       nitroGLYCERIN (NITROLINGUAL) 0.4 MG/SPRAY spray Place 1 spray under the tongue as directed. 12 g 0   Omega-3 Fatty Acids (FISH OIL PO) Take 1 capsule by mouth in the morning.       rosuvastatin (CRESTOR) 10 MG tablet Take 1 tablet (10 mg total) by mouth 3 (three) times a week. 45 tablet 3   Turmeric (QC TUMERIC COMPLEX PO) Take 1 capsule by mouth in the morning. Qunol Turmeric Curcumin- 40 MG                  Home: Home Living Family/patient expects to be discharged to:: Private residence Living Arrangements: Spouse/significant other Available Help at Discharge: Family,  Available PRN/intermittently (wife works outside the home) Type of Home: House Home Access: Ramped entrance Home Layout: One level Bathroom Shower/Tub: Psychologist, counselling, Nurse, adult Toilet: Handicapped height Bathroom Accessibility: Yes Home Equipment: Medical laboratory scientific officer - quad, Information systems manager, Grab bars - toilet, Grab bars - tub/shower, Hand held shower head, Wheelchair - manual, Rollator (4 wheels) Additional Comments: lift chair   Functional History: Prior Function Prior Level of Function : Needs assist Mobility Comments: per wife AMB withquad cane or nothing for household mobility; at baseline does not go out of house for mobility unless needed ADLs Comments: Wife assists with ADL (min-modA)   Functional Status:  Mobility: Bed Mobility Overal bed mobility: Needs Assistance Bed Mobility: Supine to Sit Rolling: Min assist, +2 for physical assistance Sidelying to sit: Min assist Supine to sit: Min assist Sit to supine: Contact guard assist General bed mobility comments: Pt performed bed mobility with general MinA. Transfers Overall transfer level: Needs assistance Equipment used: Rolling walker (2 wheels) Transfers: Sit to/from Stand Sit to Stand: Min assist Bed to/from chair/wheelchair/BSC transfer type:: Step pivot Step pivot transfers: Min assist  Lateral/Scoot Transfers: Max assist, +2 physical assistance General transfer comment: Pt performed STS from EOB MinA. Once pt was standing, they performed step pivot with the use of RW (2wheels) CGA. Pt then sat into recliner with CGA. Ambulation/Gait Ambulation/Gait assistance: Min assist Gait Distance (Feet): 50 Feet Assistive device: Rolling walker (2 wheels) Gait Pattern/deviations: Step-through pattern, Decreased step length - left, Decreased step length - right, Decreased stride length General Gait Details: Pt was brought into hallway via recliner, where they then amb 16ft in total with RW (2wheels) CGA. MinA was necessary when  performing STS, however once pt was standing CGA was necessary. STS required pt to use increased energy which resulted in a slow performance with increased effort. Pt completed a total of 3 STS. Pt needed 1 seated rest break while in hallway prior to continuing with amb. Gait velocity: decreased Pre-gait activities: marching in place performed with Min A +2 using rolling walker for 5 seconds x 2 bouts. emphasis on  upright standing posture, hip extension and avoid flexed posture, increasing standing tolerance in preparation for standing. side steps performed to the right with Min A +2 person required for safety   ADL: ADL Overall ADL's : Needs assistance/impaired Grooming: Sitting, Wash/dry hands, Wash/dry face, Set up, Supervision/safety Lower Body Bathing: Sitting/lateral leans, Moderate assistance, Maximal assistance Toilet Transfer: +2 for physical assistance, BSC/3in1, Stand-pivot, Rolling walker (2 wheels) Toilet Transfer Details (indicate cue type and reason): anticipated Toileting- Clothing Manipulation and Hygiene: Maximal assistance, Sit to/from stand Functional mobility during ADLs: Rolling walker (2 wheels), Minimal assistance, Moderate assistance General ADL Comments: MOD A for LB access in sitting   Cognition: Cognition Overall Cognitive Status: Within Functional Limits for tasks assessed Orientation Level: Oriented X4 Cognition Arousal: Alert Behavior During Therapy: WFL for tasks assessed/performed Overall Cognitive Status: Within Functional Limits for tasks assessed General Comments: pleasant and motivated to participate in PT   Physical Exam: Blood pressure (!) 121/49, pulse 60, temperature (!) 97.4 F (36.3 C), temperature source Oral, resp. rate 16, height 5\' 8"  (1.727 m), weight 90 kg, SpO2 98%. Physical Exam   General: No acute distress Mood and affect are appropriate Heart: Regular rate and rhythm no rubs murmurs or extra sounds Lungs: Clear to auscultation,  breathing unlabored, no rales or wheezes Abdomen: Positive bowel sounds, soft nontender to palpation, nondistended Extremities: No clubbing, cyanosis, or edema Skin: No evidence of breakdown, severe stasis dermatitis changes will order eucerin  Neurologic: Cranial nerves II through XII intact, motor strength is 4/5 in bilateral deltoid, bicep, tricep, grip, hip flexor, knee extensors, ankle dorsiflexor and plantar flexor Sensory exam normal sensation to light touch and proprioception in bilateral upper and lower extremities Cerebellar exam normal finger to nose to finger as well as heel to shin in bilateral upper and lower extremities Musculoskeletal: Full range of motion in all 4 extremities. No joint swelling  Lab Results Last 48 Hours        Results for orders placed or performed during the hospital encounter of 11/26/22 (from the past 48 hour(s))  Glucose, capillary     Status: Abnormal    Collection Time: 12/30/22 11:46 AM  Result Value Ref Range    Glucose-Capillary 286 (H) 70 - 99 mg/dL      Comment: Glucose reference range applies only to samples taken after fasting for at least 8 hours.    Comment 1 Notify RN      Comment 2 Document in Chart    Glucose, capillary     Status: Abnormal    Collection Time: 12/30/22  4:33 PM  Result Value Ref Range    Glucose-Capillary 184 (H) 70 - 99 mg/dL      Comment: Glucose reference range applies only to samples taken after fasting for at least 8 hours.    Comment 1 Notify RN      Comment 2 Document in Chart    Glucose, capillary     Status: None    Collection Time: 12/30/22  9:27 PM  Result Value Ref Range    Glucose-Capillary 91 70 - 99 mg/dL      Comment: Glucose reference range applies only to samples taken after fasting for at least 8 hours.  Renal function panel (daily at 0500)     Status: Abnormal    Collection Time: 12/31/22  6:03 AM  Result Value Ref Range    Sodium 136 135 - 145 mmol/L    Potassium 3.8 3.5 - 5.1 mmol/L  Chloride 97 (L) 98 - 111 mmol/L    CO2 23 22 - 32 mmol/L    Glucose, Bld 88 70 - 99 mg/dL      Comment: Glucose reference range applies only to samples taken after fasting for at least 8 hours.    BUN 72 (H) 8 - 23 mg/dL    Creatinine, Ser 4.09 (H) 0.61 - 1.24 mg/dL    Calcium 8.1 (L) 8.9 - 10.3 mg/dL    Phosphorus 6.4 (H) 2.5 - 4.6 mg/dL    Albumin 3.2 (L) 3.5 - 5.0 g/dL    GFR, Estimated 10 (L) >60 mL/min      Comment: (NOTE) Calculated using the CKD-EPI Creatinine Equation (2021)      Anion gap 16 (H) 5 - 15      Comment: Performed at [facility], [address] [place], [place] [address]  CBC with Differential/Platelet     Status: Abnormal    Collection Time: 12/31/22  6:04 AM  Result Value Ref Range    WBC 9.6 4.0 - 10.5 K/uL    RBC 3.81 (L) 4.22 - 5.81 MIL/uL    Hemoglobin 11.4 (L) 13.0 - 17.0 g/dL    HCT 47.8 (L) 29.5 - 52.0 %    MCV 90.0 80.0 - 100.0 fL    MCH 29.9 26.0 - 34.0 pg    MCHC 33.2 30.0 - 36.0 g/dL    RDW 62.1 30.8 - 65.7 %    Platelets 216 150 - 400 K/uL    nRBC 0.0 0.0 - 0.2 %    Neutrophils Relative % 75 %    Neutro Abs 7.2 1.7 - 7.7 K/uL    Lymphocytes Relative 8 %    Lymphs Abs 0.7 0.7 - 4.0 K/uL    Monocytes Relative 11 %    Monocytes Absolute 1.1 (H) 0.1 - 1.0 K/uL    Eosinophils Relative 3 %    Eosinophils Absolute 0.3 0.0 - 0.5 K/uL    Basophils Relative 0 %    Basophils Absolute 0.0 0.0 - 0.1 K/uL    Immature Granulocytes 3 %    Abs Immature Granulocytes 0.27 (H) 0.00 - 0.07 K/uL      Comment: Performed at [facility], [address] [place], [place] [address]  Basic metabolic panel     Status: Abnormal    Collection Time: 12/31/22  6:04 AM  Result Value Ref Range    Sodium 137 135 - 145 mmol/L    Potassium 3.9 3.5 - 5.1 mmol/L    Chloride 98 98 - 111 mmol/L    CO2 23 22 - 32 mmol/L    Glucose, Bld 92 70 - 99 mg/dL      Comment: Glucose reference range applies only to samples taken after fasting for at least 8  hours.    BUN 74 (H) 8 - 23 mg/dL    Creatinine, Ser 2.95 (H) 0.61 - 1.24 mg/dL    Calcium 8.2 (L) 8.9 - 10.3 mg/dL    GFR, Estimated 10 (L) >60 mL/min      Comment: (NOTE) Calculated using the CKD-EPI Creatinine Equation (2021)      Anion gap 16 (H) 5 - 15      Comment: Performed at [facility], [address] [place], [place] [address]  Glucose, capillary     Status: Abnormal    Collection Time: 12/31/22  9:38 AM  Result Value Ref Range    Glucose-Capillary 216 (H) 70 - 99  mg/dL      Comment: Glucose reference range applies only to samples taken after fasting for at least 8 hours.  Glucose, capillary     Status: Abnormal    Collection Time: 12/31/22 12:11 PM  Result Value Ref Range    Glucose-Capillary 191 (H) 70 - 99 mg/dL      Comment: Glucose reference range applies only to samples taken after fasting for at least 8 hours.  Glucose, capillary     Status: Abnormal    Collection Time: 12/31/22  9:37 PM  Result Value Ref Range    Glucose-Capillary 253 (H) 70 - 99 mg/dL      Comment: Glucose reference range applies only to samples taken after fasting for at least 8 hours.    Comment 1 Notify RN    CBC with Differential/Platelet     Status: Abnormal    Collection Time: 01/01/23  6:20 AM  Result Value Ref Range    WBC 9.3 4.0 - 10.5 K/uL    RBC 3.81 (L) 4.22 - 5.81 MIL/uL    Hemoglobin 11.3 (L) 13.0 - 17.0 g/dL    HCT 16.1 (L) 09.6 - 52.0 %    MCV 91.6 80.0 - 100.0 fL    MCH 29.7 26.0 - 34.0 pg    MCHC 32.4 30.0 - 36.0 g/dL    RDW 04.5 40.9 - 81.1 %    Platelets 205 150 - 400 K/uL    nRBC 0.0 0.0 - 0.2 %    Neutrophils Relative % 75 %    Neutro Abs 7.0 1.7 - 7.7 K/uL    Lymphocytes Relative 9 %    Lymphs Abs 0.8 0.7 - 4.0 K/uL    Monocytes Relative 11 %    Monocytes Absolute 1.0 0.1 - 1.0 K/uL    Eosinophils Relative 3 %    Eosinophils Absolute 0.3 0.0 - 0.5 K/uL    Basophils Relative 0 %    Basophils Absolute 0.0 0.0 - 0.1 K/uL    Immature Granulocytes 2 %     Abs Immature Granulocytes 0.21 (H) 0.00 - 0.07 K/uL      Comment: Performed at [facility], [address], [place], [place] [address]  Basic metabolic panel     Status: Abnormal    Collection Time: 01/01/23  6:20 AM  Result Value Ref Range    Sodium 137 135 - 145 mmol/L    Potassium 3.7 3.5 - 5.1 mmol/L    Chloride 96 (L) 98 - 111 mmol/L    CO2 26 22 - 32 mmol/L    Glucose, Bld 82 70 - 99 mg/dL      Comment: Glucose reference range applies only to samples taken after fasting for at least 8 hours.    BUN 43 (H) 8 - 23 mg/dL    Creatinine, Ser 2.95 (H) 0.61 - 1.24 mg/dL    Calcium 8.2 (L) 8.9 - 10.3 mg/dL    GFR, Estimated 15 (L) >60 mL/min      Comment: (NOTE) Calculated using the CKD-EPI Creatinine Equation (2021)      Anion gap 15 5 - 15      Comment: Performed at [facility], [address] [place], [place] [address]  Renal function panel (daily at 0500)     Status: Abnormal    Collection Time: 01/01/23  6:21 AM  Result Value Ref Range    Sodium 138 135 - 145 mmol/L    Potassium 3.8 3.5 - 5.1 mmol/L  Chloride 97 (L) 98 - 111 mmol/L    CO2 27 22 - 32 mmol/L    Glucose, Bld 83 70 - 99 mg/dL      Comment: Glucose reference range applies only to samples taken after fasting for at least 8 hours.    BUN 42 (H) 8 - 23 mg/dL    Creatinine, Ser 0.45 (H) 0.61 - 1.24 mg/dL    Calcium 8.2 (L) 8.9 - 10.3 mg/dL    Phosphorus 4.7 (H) 2.5 - 4.6 mg/dL    Albumin 3.3 (L) 3.5 - 5.0 g/dL    GFR, Estimated 16 (L) >60 mL/min      Comment: (NOTE) Calculated using the CKD-EPI Creatinine Equation (2021)      Anion gap 14 5 - 15      Comment: Performed at [facility], [address] [place], [place] [address]  Glucose, capillary     Status: Abnormal    Collection Time: 01/01/23  9:35 AM  Result Value Ref Range    Glucose-Capillary 124 (H) 70 - 99 mg/dL      Comment: Glucose reference range applies only to samples taken after fasting for at least 8 hours.       Imaging Results (Last 48 hours)  No results found.         Blood pressure (!) 121/49, pulse 60, temperature (!) 97.4 F (36.3 C), temperature source Oral, resp. rate 16, height 5\' 8"  (1.727 m), weight 90 kg, SpO2 98%.   Medical Problem List and Plan: 1. Functional deficits secondary to CHF             -patient may not  shower, HD cath             -ELOS/Goals: 18-21d, pt  goal is home by Birthday on 10/29   2.  Antithrombotics: -DVT/anticoagulation:  Pharmaceutical: Eliquis             -antiplatelet therapy:    3. Pain Management: Tylenol as needed   4. Mood/Behavior/Sleep: LCSW to evaluate and provide emotional support             -antipsychotic agents: n/a   5. Neuropsych/cognition: This patient is  capable of making decisions on his  own behalf.   6. Skin/Wound Care: Routine skin care checks, Eucerin cream for stasis dermatitis   7. Fluids/Electrolytes/Nutrition: Strict Is and Os and follow-up chemistries per nephrology             -renal diet with FR             -continue vitamin C, D3, Renavit   8: Hypertension: monitor TID and prn             -continue doxazosin 1 mg BID             -continue Lasix 80 mg daily             -continue hydralazine 25 mg TID   Vitals:   01/02/23 0858 01/02/23 0905  BP: (!) 172/81 (!) 152/61  Pulse: (!) 58 (!) 58  Resp: (!) 25 (!) 21  Temp: 97.6 F (36.4 C)   SpO2: 99% 100%   Will monitor for orthostatic chages and fluctuations related to HD.  May need to increase hydralazine or doxazosin if this remains elevated  9: Hyperlipidemia: continue statin, Lovaza   10: Right eye infection probable blepharitis: continue Maxitrol 2 wk treatment    11: GERD: continue PPI   12: DM-2: A1c = 7.3% (home  on glipizide)             -continue SSI             -Novolog 3 units with meals             -continue Semglee 20 units daily CBG (last 3)  Recent Labs    01/03/23 2313 01/04/23 0851 01/04/23 1156  GLUCAP 132* 87 173*   Control is  good, but may need to reduce Semglee if am CBG remain below 100 monitor with increased exercise    13: ESRD: HD on T/T/S via right internal jugular TDC   14: Chronic diastolic CHF: daily weight (dry weight ~220#)  EF is normal  -(meds as in #8) -fluid balance>>HD   15: Atrial fibrillation: on Eliquis - bradycardia , not taking BBs monitor rate    16: COPD/acute hypoxic respiratory failure due to volume overload:  -continue Breo Ellipta             -Wean supplemental O2 as able   17: CAD: on statin, meds as in #8   18: Obesity: BMI = 30.17   19: Anemia of chronic inflammation, cardiorenal disease ESRD: -continue Epogen 4000 units every TTS on hemodialysis days as per nephrology     20: Bilateral lower extremity lymphedema, chronic: -continue Lotrisone cream twice daily      Milinda Antis, PA-C 01/01/2023

## 2023-01-04 NOTE — Progress Notes (Signed)
PMR Admission Coordinator Pre-Admission Assessment   Patient: Kyle Roberson is an 82 y.o., male MRN: [id number] DOB: [birth year] Height: 5\' 8"  (172.7 cm) Weight: 84.7 kg   Insurance Information HMO:     PPO: yes     PCP:      IPA:      80/20:      OTHER:  PRIMARY: [employer] Medicare      Policy#: [id number]       Subscriber: pt CM Name: Val      Phone#: not provided     Fax#: [phone] Pre-Cert#: [id number] auth for CIR from Val with [employer] with updates due to fax listed above on 01/09/2023   Employer:  Benefits:  Phone #: [phone]     Name:  Eff. Date: 03/23/22     Deduct: $100 (met)      Out of Pocket Max: $1400 (met)      Life Max:  CIR: 80%      SNF: $20/day for days 1-20 Outpatient: 80%     Co-Pay: 20% Home Health:       Co-Pay: $25/visit DME: 80%     Co-Pay: 20% Providers:  SECONDARY: [employer] State [employer] Plan      Policy#: [id number]     Phone#: [phone]   Financial Counselor:       Phone#:    The "Data Collection Information Summary" for patients in Inpatient Rehabilitation Facilities with attached "Privacy Act Statement-Health Care Records" was provided and verbally reviewed with: Patient and Family   Emergency Contact Information Contact Information       Name Relation Home Work Mobile    Pitman,[name] Spouse     [phone]         Other Contacts   None on File        Current Medical History  Patient Admitting Diagnosis: cardiac debility    History of Present Illness: Pt is an 82 y/o male with PMH of CKD, CHF, CAD, afib (on eliquis), pulmonary hypertension, and SDH s/p crani evacuation who admitted to [facility] on 11/26/22 with c/o increasing weight gain, bilat LE edema, and SOB.  His baseline weight is 220 lbs and in the ED was 255 lbs.  Creatine on admit was 2.85 compared to baseline 2.5.  He was diagnosed with acute on chronic CHF and started on lasix drip and cardiology consulted.  Echo this admission with EF 55-60%, mild LVH, moderate RV enlargement  with mildly decreased RV systolic function.  He was started on a milrinone drip 9/9.  RHC done showing elevated R>L filling pressures, mostly right-sided.  He had worsening renal function so was taken off lasix drip and started on CRRT 9/12 and transitioned to permanent HD on TRS schedule.  He resumed eliquis on 9/24.  Therapy ongoing and pt has been recommended for CIR>    Patient's medical record from [facility] has been reviewed by the rehabilitation admission coordinator and physician.   Past Medical History      Past Medical History:  Diagnosis Date   AKI (acute kidney injury) (HCC) 07/06/2018   CAD (coronary artery disease)      a. Remote PCI/stenting to LAD w PTCA Diagnoal. RCA 60%; b.  2005/2008 Cardiolites w/ reportedly mild ischemia in Diag territory-->Med rx.   Cellulitis of lower extremity 05/31/2019   Chronic heart failure with preserved ejection fraction (HFpEF) (HCC)      a. 04/2019 Echo: EF 55-60%, no rwma, nl RV fxn, RVSP 55.33mmHg. Mod dil LA. Mild-mod MR. Mod  dil PA.   CKD (chronic kidney disease), stage IV (HCC)     Diabetes (HCC)     GERD (gastroesophageal reflux disease)     History of MI (myocardial infarction) 06/27/2014   HLD (hyperlipidemia)     HTN (hypertension)     PAH (pulmonary artery hypertension) (HCC)      a. 04/2019 Echo: RVSP 55.72mmHg.   Permanent atrial fibrillation (HCC)      a. CHA2DS2VASc = 5-->dose adjusted eliquis (age/creat).   Subdural hematoma (HCC)      a. 01/2021 in setting of fall s/p L frontotemporal craniotomy.          Has the patient had major surgery during 100 days prior to admission? Yes   Family History   family history includes CAD in his father; Diabetes in his brother; Pancreatic cancer in his mother.   Current Medications  Current Medications    Current Facility-Administered Medications:    0.9 %  sodium chloride infusion, 250 mL, Intravenous, PRN, Wyn Quaker, Marlow Baars, MD   acetaminophen (TYLENOL) tablet 650 mg, 650 mg, Oral,  Q6H PRN, 650 mg at 12/27/22 2136 **OR** acetaminophen (TYLENOL) suppository 650 mg, 650 mg, Rectal, Q6H PRN, Dew, Marlow Baars, MD   apixaban (ELIQUIS) tablet 2.5 mg, 2.5 mg, Oral, BID, Dew, Marlow Baars, MD, 2.5 mg at 01/04/23 6213   ascorbic acid (VITAMIN C) tablet 500 mg, 500 mg, Oral, BID, Annice Needy, MD, 500 mg at 01/04/23 0865   bisacodyl (DULCOLAX) suppository 10 mg, 10 mg, Rectal, Daily PRN, Rosezetta Schlatter T, MD, 10 mg at 01/01/23 1428   calcium carbonate (TUMS - dosed in mg elemental calcium) chewable tablet 800 mg of elemental calcium, 800 mg of elemental calcium, Oral, TID WC PRN, Lindajo Royal V, MD, 800 mg of elemental calcium at 12/23/22 0926   Chlorhexidine Gluconate Cloth 2 % PADS 6 each, 6 each, Topical, Daily, Annice Needy, MD, 6 each at 01/03/23 0949   cholecalciferol (VITAMIN D3) 25 MCG (1000 UNIT) tablet 2,000 Units, 2,000 Units, Oral, Daily, Annice Needy, MD, 2,000 Units at 01/04/23 7846   clotrimazole-betamethasone (LOTRISONE) cream, , Topical, BID, Annice Needy, MD, Given at 01/04/23 9629   diphenhydrAMINE-zinc acetate (BENADRYL) 2-0.1 % cream, , Topical, BID PRN, Annice Needy, MD, Given at 12/22/22 1025   docusate sodium (COLACE) capsule 100 mg, 100 mg, Oral, BID, Annice Needy, MD, 100 mg at 01/04/23 5284   doxazosin (CARDURA) tablet 1 mg, 1 mg, Oral, BID, Dew, Marlow Baars, MD, 1 mg at 01/04/23 0957   fluticasone furoate-vilanterol (BREO ELLIPTA) 200-25 MCG/ACT 1 puff, 1 puff, Inhalation, Daily, Gillis Santa, MD, 1 puff at 01/04/23 0940   furosemide (LASIX) tablet 80 mg, 80 mg, Oral, Q M,W,F, Dew, Marlow Baars, MD, 80 mg at 01/04/23 1324   heparin sodium (porcine) injection 1,000 Units, 1,000 Units, Intravenous, PRN, Wendee Beavers, NP, 1,000 Units at 12/29/22 1341   hydrALAZINE (APRESOLINE) tablet 25 mg, 25 mg, Oral, TID, Brynda Peon L, NP, 25 mg at 01/04/23 4010   hydrOXYzine (ATARAX) tablet 25 mg, 25 mg, Oral, TID PRN, Gillis Santa, MD   insulin aspart (novoLOG) injection 0-15 Units,  0-15 Units, Subcutaneous, TID AC & HS, Dew, Marlow Baars, MD, 2 Units at 01/04/23 0005   insulin aspart (novoLOG) injection 3 Units, 3 Units, Subcutaneous, TID WC, Annice Needy, MD, 3 Units at 01/04/23 0938   insulin glargine-yfgn (SEMGLEE) injection 20 Units, 20 Units, Subcutaneous, Daily, Gillis Santa, MD, 20 Units at 01/04/23  7829   ipratropium-albuterol (DUONEB) 0.5-2.5 (3) MG/3ML nebulizer solution 3 mL, 3 mL, Nebulization, Q4H PRN, Sreeram, Narendranath, MD   isosorbide mononitrate (IMDUR) 24 hr tablet 60 mg, 60 mg, Oral, Daily, Laurey Morale, MD, 60 mg at 01/04/23 5621   morphine (PF) 2 MG/ML injection 2 mg, 2 mg, Intravenous, Q2H PRN, Andris Baumann, MD   multivitamin (RENA-VIT) tablet 1 tablet, 1 tablet, Oral, QHS, Dew, Marlow Baars, MD, 1 tablet at 01/03/23 2223   neomycin-polymyxin b-dexamethasone (MAXITROL) ophthalmic ointment 1 Application, 1 Application, Right Eye, TID, Annice Needy, MD, 1 Application at 01/04/23 1002   nitroGLYCERIN (NITROSTAT) SL tablet 0.4 mg, 0.4 mg, Sublingual, Q5 min PRN, Lindajo Royal V, MD, 0.4 mg at 12/20/22 2201   omega-3 acid ethyl esters (LOVAZA) capsule 1 g, 1 g, Oral, Daily, Dew, Marlow Baars, MD, 1 g at 01/04/23 0938   ondansetron (ZOFRAN) tablet 4 mg, 4 mg, Oral, Q6H PRN **OR** ondansetron (ZOFRAN) injection 4 mg, 4 mg, Intravenous, Q6H PRN, Annice Needy, MD, 4 mg at 12/01/22 1451   oxyCODONE (Oxy IR/ROXICODONE) immediate release tablet 5 mg, 5 mg, Oral, Q6H PRN, Annice Needy, MD, 5 mg at 12/22/22 2135   [COMPLETED] pantoprazole (PROTONIX) injection 40 mg, 40 mg, Intravenous, Q12H, 40 mg at 12/22/22 2135 **FOLLOWED BY** pantoprazole (PROTONIX) EC tablet 40 mg, 40 mg, Oral, Daily, Gillis Santa, MD, 40 mg at 01/04/23 3086   polyethylene glycol (MIRALAX / GLYCOLAX) packet 17 g, 17 g, Oral, Daily PRN, Annice Needy, MD   rosuvastatin (CRESTOR) tablet 10 mg, 10 mg, Oral, Once per day on Monday Wednesday Friday, Wyn Quaker, Marlow Baars, MD, 10 mg at 01/04/23 5784   simethicone  (MYLICON) 40 MG/0.6ML suspension 40 mg, 40 mg, Oral, Q6H PRN, Gillis Santa, MD   sodium chloride flush (NS) 0.9 % injection 3 mL, 3 mL, Intravenous, Q12H, Dew, Marlow Baars, MD, 3 mL at 01/03/23 2232   sodium chloride flush (NS) 0.9 % injection 3 mL, 3 mL, Intravenous, PRN, Annice Needy, MD   traZODone (DESYREL) tablet 25 mg, 25 mg, Oral, QHS PRN, Annice Needy, MD, 25 mg at 12/27/22 2135     Patients Current Diet:  Diet Order                  Diet renal with fluid restriction Fluid consistency: Thin  Diet effective now                         Precautions / Restrictions Precautions Precautions: Fall Restrictions Weight Bearing Restrictions: No RLE Weight Bearing: Weight bearing as tolerated LLE Weight Bearing: Weight bearing as tolerated Other Position/Activity Restrictions: does not wear CAM boot for mobility today    Has the patient had 2 or more falls or a fall with injury in the past year? No   Prior Activity Level Household: no DME in the house, mod I for mobility, assist for ADLs, doesn't drive, uses quad cane in the community   Prior Functional Level Self Care: Did the patient need help bathing, dressing, using the toilet or eating? Needed some help   Indoor Mobility: Did the patient need assistance with walking from room to room (with or without device)? Independent   Stairs: Did the patient need assistance with internal or external stairs (with or without device)? Needed some help   Functional Cognition: Did the patient need help planning regular tasks such as shopping or remembering to take medications? Independent  Patient Information Are you of Hispanic, Latino/a,or Spanish origin?: A. No, not of Hispanic, Latino/a, or Spanish origin What is your race?: A. White Do you need or want an interpreter to communicate with a doctor or health care staff?: 0. No   Patient's Response To:  Health Literacy and Transportation Is the patient able to respond to health  literacy and transportation needs?: Yes Health Literacy - How often do you need to have someone help you when you read instructions, pamphlets, or other written material from your doctor or pharmacy?: Never In the past 12 months, has lack of transportation kept you from medical appointments or from getting medications?: No In the past 12 months, has lack of transportation kept you from meetings, work, or from getting things needed for daily living?: No   Home Assistive Devices / Equipment Home Assistive Devices/Equipment: Environmental consultant (specify type), Wheelchair, Grab bars in shower, [employer] Home Equipment: [employer] - quad, Shower seat, Grab bars - toilet, Grab bars - tub/shower, Hand held shower head, Wheelchair - manual, Rollator (4 wheels)   Prior Device Use: Indicate devices/aids used by the patient prior to current illness, exacerbation or injury?  Quad cane   Current Functional Level Cognition   Overall Cognitive Status: Within Functional Limits for tasks assessed Orientation Level: Oriented X4 General Comments: pleasant and motivated to participate in PT    Extremity Assessment (includes Sensation/Coordination)   Upper Extremity Assessment: Generalized weakness  Lower Extremity Assessment: Generalized weakness, LLE deficits/detail LLE Deficits / Details: ankle pain with WBing continues LLE: Unable to fully assess due to pain     ADLs   Overall ADL's : Needs assistance/impaired Grooming: Sitting, Wash/dry hands, Wash/dry face, Set up, Supervision/safety Lower Body Bathing: Sitting/lateral leans, Moderate assistance, Maximal assistance Toilet Transfer: +2 for physical assistance, BSC/3in1, Stand-pivot, Rolling walker (2 wheels) Toilet Transfer Details (indicate cue type and reason): anticipated Toileting- Clothing Manipulation and Hygiene: Maximal assistance, Sit to/from stand Functional mobility during ADLs: Rolling walker (2 wheels), Minimal assistance, Moderate assistance General ADL  Comments: MOD A + RW for BSC t/f     Mobility   Overal bed mobility: Needs Assistance Bed Mobility: Supine to Sit Rolling: Min assist Sidelying to sit: Min assist Supine to sit: Mod assist, +2 for physical assistance Sit to supine: Contact guard assist General bed mobility comments: verbal cues for taks initiation and sequencing. increased time and effort required     Transfers   Overall transfer level: Needs assistance Equipment used: Rolling walker (2 wheels) Transfers: Sit to/from Stand Sit to Stand: Min assist, Mod assist Bed to/from chair/wheelchair/BSC transfer type:: Step pivot Step pivot transfers: Min assist  Lateral/Scoot Transfers: Max assist, +2 physical assistance General transfer comment: transfer assist varried during session     Ambulation / Gait / Stairs / Psychologist, prison and probation services   Ambulation/Gait Ambulation/Gait assistance: Editor, commissioning (Feet): 45 Feet Assistive device: Rolling walker (2 wheels) Gait Pattern/deviations: Step-through pattern, Decreased step length - left, Decreased step length - right General Gait Details: 45' x 2 with seated rest break.  no chair follow needed for in room but would benefit from it in hallway but he declined to go out of room Gait velocity: decreased Gait velocity interpretation: <1.31 ft/sec, indicative of household ambulator Pre-gait activities: marching in place performed with Min A +2 using rolling walker for 5 seconds x 2 bouts. emphasis on upright standing posture, hip extension and avoid flexed posture, increasing standing tolerance in preparation for standing. side steps performed to the right with  Min A +2 person required for safety     Posture / Balance Dynamic Sitting Balance Sitting balance - Comments: able to maintain seated EOB balance during functional activities requiring CGA but able to progress to close sup A Balance Overall balance assessment: Needs assistance Sitting-balance support: Feet supported,  Single extremity supported Sitting balance-Leahy Scale: Good Sitting balance - Comments: able to maintain seated EOB balance during functional activities requiring CGA but able to progress to close sup A Postural control: Posterior lean Standing balance support: Bilateral upper extremity supported, Reliant on assistive device for balance Standing balance-Leahy Scale: Fair Standing balance comment: external support required     Special needs/care consideration Dialysis: Hemodialysis Tuesday, Thursday, and Saturday and Diabetic management yes    Previous Home Environment (from acute therapy documentation) Living Arrangements: Spouse/significant other Available Help at Discharge: Family, Available PRN/intermittently (wife works outside the home) Type of Home: [employer] Home Layout: One level Home Access: Ramped entrance Bathroom Shower/Tub: Psychologist, counselling, Nurse, adult Toilet: Handicapped height Bathroom Accessibility: Yes How Accessible: Accessible via wheelchair Home Care Services: No Additional Comments: lift chair   Discharge Living Setting Plans for Discharge Living Setting: Patient's home, Lives with (comment) (spouse ([name])) Type of Home at Discharge: House Discharge Home Layout: One level, Laundry or work area in basement Discharge Home Access: Ramped entrance Discharge Bathroom Shower/Tub: Walk-in shower, Tub/shower unit Discharge Bathroom Toilet: Handicapped height Discharge Bathroom Accessibility: Yes How Accessible: Accessible via wheelchair Does the patient have any problems obtaining your medications?: No   Social/Family/Support Systems Anticipated Caregiver: Harvel Quale Anticipated Caregiver's Contact Information: [phone] Ability/Limitations of Caregiver: none stated Caregiver Availability: 24/7 Discharge Plan Discussed with Primary Caregiver: Yes Is Caregiver In Agreement with Plan?: Yes Does Caregiver/Family have Issues with Lodging/Transportation  while Pt is in Rehab?: No   Goals Patient/Family Goal for Rehab: PT/OT supervision to min assist, SLP supervision Expected length of stay: 18-21 days Additional Information: Discharge plan: return to pt's home with spouse, [name], providing 24/7 assist Pt/Family Agrees to Admission and willing to participate: Yes Program Orientation Provided & Reviewed with Pt/Caregiver Including Roles  & Responsibilities: Yes  Barriers to Discharge: Insurance for SNF coverage   Decrease burden of Care through IP rehab admission: no   Possible need for SNF placement upon discharge: Not anticipated.  Discharge plan: return home with patient's spouse to provide 24/7 support as with previous admission to CIR in 2022.    Patient Condition: I have reviewed medical records from [facility], spoken with CSW, and spouse. I discussed via phone for inpatient rehabilitation assessment.  Patient will benefit from ongoing PT, OT, and SLP, can actively participate in 3 hours of therapy a day 5 days of the week, and can make measurable gains during the admission.  Patient will also benefit from the coordinated team approach during an Inpatient Acute Rehabilitation admission.  The patient will receive intensive therapy as well as Rehabilitation physician, nursing, social worker, and care management interventions.  Due to bladder management, bowel management, safety, skin/wound care, disease management, medication administration, pain management, and patient education the patient requires 24 hour a day rehabilitation nursing.  The patient is currently mod assist +2 with mobility and basic ADLs.  Discharge setting and therapy post discharge at home with home health is anticipated.  Patient has agreed to participate in the Acute Inpatient Rehabilitation Program and will admit today   Preadmission Screen Completed By:  Erick Colace, PT, DPT 01/04/2023 10:37 AM ______________________________________________________________________    Discussed status with Dr.  Kirsteins on 01/04/23  at 10:37 AM  and received approval for admission today.   Admission Coordinator:  Erick Colace, MD, DPT time 10:37 AM Dorna Bloom 01/04/23     Assessment/Plan: Diagnosis:debility after CHF exacerbation Does the need for close, 24 hr/day Medical supervision in concert with the patient's rehab needs make it unreasonable for this patient to be served in a less intensive setting? Yes Co-Morbidities requiring supervision/potential complications: acute on chronic renal failure now on HD, Afib, CAD  Due to bladder management, bowel management, safety, skin/wound care, disease management, medication administration, pain management, and patient education, does the patient require 24 hr/day rehab nursing? Yes Does the patient require coordinated care of a physician, rehab nurse, PT, OT, and SLP to address physical and functional deficits in the context of the above medical diagnosis(es)? Yes Addressing deficits in the following areas: balance, endurance, locomotion, strength, transferring, bowel/bladder control, bathing, dressing, toileting, cognition, and psychosocial support Can the patient actively participate in an intensive therapy program of at least 3 hrs of therapy 5 days a week? Yes The potential for patient to make measurable gains while on inpatient rehab is fair Anticipated functional outcomes upon discharge from inpatient rehab: supervision and min assist PT, supervision and min assist OT, supervision SLP Estimated rehab length of stay to reach the above functional goals is: 18-21d Anticipated discharge destination: Home 10. Overall Rehab/Functional Prognosis: fair     MD Signature: Erick Colace M.D. [facility] Fellow Am Acad of Phys Med and Rehab Diplomate Am Board of Electrodiagnostic Med Fellow Am Board of Interventional Pain

## 2023-01-04 NOTE — TOC Transition Note (Signed)
Transition of Care [facility]) - CM/SW Discharge Note   Patient Details  Name: Kyle Roberson MRN: [id number] Date of Birth: 1941/01/25  [facility]) CM/SW Contact:  Margarito Liner, LCSW Phone Number: 01/04/2023, 11:14 AM   Clinical Narrative:   Patient has orders to discharge to [facility] today. They will RN for report. Admissions coordinator arranged [facility] transport for 2:00 pm. Transport paperwork is in the discharge packet. No further concerns. CSW signing off.  Final next level of care: IP Rehab Facility Barriers to Discharge: Barriers Resolved   Patient Goals and CMS Choice      Discharge Placement                  Patient to be transferred to facility by: [facility]   Patient and family notified of of transfer: 01/04/23  Discharge Plan and Services Additional resources added to the After Visit Summary for                                       Social Determinants of Health (SDOH) Interventions SDOH Screenings   Food Insecurity: No Food Insecurity (12/01/2022)  Housing: Low Risk  (12/01/2022)  Transportation Needs: No Transportation Needs (12/01/2022)  Utilities: Not At Risk (12/01/2022)  Depression (PHQ2-9): Low Risk  (07/27/2022)  Tobacco Use: Medium Risk (11/26/2022)     Readmission Risk Interventions     No data to display

## 2023-01-04 NOTE — Progress Notes (Addendum)
[facility]  ROUNDING NOTE   Subjective:   Patient sitting up in bed Wife at bedside  Reports shortness of breath and diaphoresis after physical therapy session yesterday   Objective:  Vital signs in last 24 hours:  Temp:  [97.8 F (36.6 C)-98.2 F (36.8 C)] 97.9 F (36.6 C) (10/14 0853) Pulse Rate:  [55-72] 57 (10/14 0853) Resp:  [18-19] 18 (10/14 0853) BP: (87-138)/(53-61) 138/61 (10/14 0853) SpO2:  [95 %-100 %] 97 % (10/14 0853)  Weight change:  Filed Weights   01/01/23 0348 01/02/23 0400 01/02/23 1253  Weight: 90 kg 92 kg 84.7 kg    Intake/Output: I/O last 3 completed shifts: In: 960 [P.O.:960] Out: -    Intake/Output this shift:  No intake/output data recorded.  Physical Exam: General: NAD  Head: Normocephalic, atraumatic. Moist oral mucosal membranes  Eyes: Anicteric, ectropion  Lungs:  Clear to auscultation, room air  Heart: S1S2 no rubs  Abdomen:  Soft, nontender  Extremities: Trace peripheral edema.  Neurologic: Awake, alert  Skin: No lesions  Access: Rt Chest permcath    Basic Metabolic Panel: Recent Labs  Lab 12/31/22 0603 12/31/22 0604 01/01/23 4098 01/02/23 0522 01/02/23 0523 01/03/23 0432 01/04/23 0505  NA 136   < > 138 138 136 137 140  K 3.8   < > 3.8 3.9 3.8 3.7 4.1  CL 97*   < > 97* 98 98 97* 98  CO2 23   < > 27 23 23 25 23   GLUCOSE 88   < > 83 113* 113* 80 66*  BUN 72*   < > 42* 67* 65* 43* 69*  CREATININE 5.46*   < > 3.69* 5.23* 5.32* 3.82* 5.71*  CALCIUM 8.1*   < > 8.2* 8.0* 7.9* 8.3* 8.4*  PHOS 6.4*  --  4.7*  --  6.1* 4.8* 7.2*   < > = values in this interval not displayed.    Liver Function Tests: Recent Labs  Lab 12/31/22 0603 01/01/23 0621 01/02/23 0523 01/03/23 0432 01/04/23 0505  ALBUMIN 3.2* 3.3* 3.1* 3.3* 3.1*   No results for input(s): "LIPASE", "AMYLASE" in the last 168 hours. No results for input(s): "AMMONIA" in the last 168 hours.  CBC: Recent Labs  Lab 12/31/22 0604 01/01/23 0620  01/02/23 0522 01/04/23 0505  WBC 9.6 9.3 9.7 10.2  NEUTROABS 7.2 7.0 7.1 7.3  HGB 11.4* 11.3* 10.9* 11.6*  HCT 34.3* 34.9* 32.7* 34.7*  MCV 90.0 91.6 89.8 89.4  PLT 216 205 199 222    Cardiac Enzymes: No results for input(s): "CKTOTAL", "CKMB", "CKMBINDEX", "TROPONINI" in the last 168 hours.  BNP: Invalid input(s): "POCBNP"  CBG: Recent Labs  Lab 01/03/23 1126 01/03/23 1457 01/03/23 2313 01/04/23 0851 01/04/23 1156  GLUCAP 217* 173* 132* 87 173*    Microbiology: Results for orders placed or performed during the hospital encounter of 11/26/22  MRSA Next Gen by PCR, Nasal     Status: None   Collection Time: 11/30/22  4:10 PM   Specimen: Nasal Mucosa; Nasal Swab  Result Value Ref Range Status   MRSA by PCR Next Gen NOT DETECTED NOT DETECTED Final    Comment: (NOTE) The GeneXpert MRSA Assay (FDA approved for NASAL specimens only), is one component of a comprehensive MRSA colonization surveillance program. It is not intended to diagnose MRSA infection nor to guide or monitor treatment for MRSA infections. Test performance is not FDA approved in patients less than 39 years old. Performed at [facility], [address] [place], [place] [address]     Coagulation Studies: No results for input(s): "LABPROT", "INR" in the last 72 hours.    Urinalysis: No results for input(s): "COLORURINE", "LABSPEC", "PHURINE", "GLUCOSEU", "HGBUR", "BILIRUBINUR", "KETONESUR", "PROTEINUR", "UROBILINOGEN", "NITRITE", "LEUKOCYTESUR" in the last 72 hours.  Invalid input(s): "APPERANCEUR"    Imaging: No results found.   Medications:    sodium chloride      apixaban  2.5 mg Oral BID   vitamin C  500 mg Oral BID   Chlorhexidine Gluconate Cloth  6 each Topical Daily   cholecalciferol  2,000 Units Oral Daily   clotrimazole-betamethasone   Topical BID   docusate sodium  100 mg Oral BID   doxazosin  1 mg Oral BID   fluticasone furoate-vilanterol  1 puff Inhalation Daily    furosemide  80 mg Oral Q M,W,F   hydrALAZINE  25 mg Oral TID   insulin aspart  0-15 Units Subcutaneous TID AC & HS   insulin aspart  3 Units Subcutaneous TID WC   insulin glargine-yfgn  20 Units Subcutaneous Daily   isosorbide mononitrate  60 mg Oral Daily   multivitamin  1 tablet Oral QHS   neomycin-polymyxin b-dexamethasone  1 Application Right Eye TID   omega-3 acid ethyl esters  1 g Oral Daily   pantoprazole  40 mg Oral Daily   rosuvastatin  10 mg Oral Once per day on Monday Wednesday Friday   sodium chloride flush  3 mL Intravenous Q12H   sodium chloride, acetaminophen **OR** acetaminophen, bisacodyl, calcium carbonate, diphenhydrAMINE-zinc acetate, heparin sodium (porcine), hydrOXYzine, ipratropium-albuterol, morphine injection, nitroGLYCERIN, ondansetron **OR** ondansetron (ZOFRAN) IV, oxyCODONE, polyethylene glycol, simethicone, sodium chloride flush, traZODone  Assessment/ Plan:  Mr. Lonza Shimabukuro is a 82 y.o.  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 Due to progression of kidney disease and outpatient diuretic therapy failure, we feel this patient has progressed to end stage renal disease.  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 CRRT from 9/12 to 9/13 Post weight: 84.7kg . Next dialysis treatment scheduled for Tuesday. Scheduled discharge to CIR later today.  Acute exacerbation of chronic diastolic congestive heart failure: complicated with acute respiratory failure and pulmonary hypertension. - Oral Furosemide 80mg  on non-HD days - Will stop Cardura due to symptomatic hypotension reported yesterday.    Diabetes mellitus type II with chronic kidney disease - holding empagliflozin  -Sliding scale insulin managed by primary team.  4.  Anemia of chronic kidney disease. Will continue to assess need for EPO once below 10.  Hemoglobin 11.6  5.  Secondary hyperparathyroidism.  Continue to periodically monitor bone metabolism parameters.  Continue cholecalciferol.  Calcium and phosphorus within optimal range.   LOS: 39 [name] Carden 10/14/202412:59 PM

## 2023-01-05 DIAGNOSIS — I4891 Unspecified atrial fibrillation: Secondary | ICD-10-CM

## 2023-01-05 DIAGNOSIS — I10 Essential (primary) hypertension: Secondary | ICD-10-CM

## 2023-01-05 LAB — GLUCOSE, CAPILLARY
Glucose-Capillary: 90 mg/dL (ref 70–99)
Glucose-Capillary: 223 mg/dL — ABNORMAL HIGH (ref 70–99)
Glucose-Capillary: 121 mg/dL — ABNORMAL HIGH (ref 70–99)
Glucose-Capillary: 120 mg/dL — ABNORMAL HIGH (ref 70–99)

## 2023-01-05 MED ORDER — PROSOURCE PLUS PO LIQD
30.0000 mL | Freq: Two times a day (BID) | ORAL | Status: DC
  Filled 2023-01-05 (×16): qty 30

## 2023-01-05 MED ORDER — INSULIN GLARGINE-YFGN 100 UNIT/ML ~~LOC~~ SOLN
18.0000 [IU] | Freq: Every day | SUBCUTANEOUS | Status: DC
  Administered 2023-01-06 – 2023-01-07 (×2): 18 [IU] via SUBCUTANEOUS
  Filled 2023-01-05 (×3): qty 0.18

## 2023-01-05 MED ORDER — CHLORHEXIDINE GLUCONATE CLOTH 2 % EX PADS
6.0000 | MEDICATED_PAD | Freq: Two times a day (BID) | CUTANEOUS | Status: DC
  Administered 2023-01-05 – 2023-01-08 (×5): 6 via TOPICAL

## 2023-01-05 MED ORDER — SEVELAMER CARBONATE 800 MG PO TABS
800.0000 mg | ORAL_TABLET | Freq: Three times a day (TID) | ORAL | Status: DC
  Administered 2023-01-05 – 2023-01-08 (×9): 800 mg via ORAL
  Filled 2023-01-05 (×10): qty 1

## 2023-01-05 NOTE — Progress Notes (Signed)
Inpatient Rehabilitation  Patient information reviewed and entered into eRehab system by Cheri Rous, OTR/L, Rehab Quality Coordinator.   Information including medical coding, functional ability and quality indicators will be reviewed and updated through discharge.

## 2023-01-05 NOTE — Consult Note (Signed)
Kyle Roberson [facility]al Consultation Note    Indication for Consultation:  Management of ESRD/hemodialysis, anemia, hypertension/volume, and secondary hyperparathyroidism. PCP:  HPI: Kyle Roberson is a 82 y.o. male with (newly declared) ESRD, T2DM, HTN, CAD, HFpEF, A-fib (on Eliquis), and Hx SDH (01/2021) who was transferred from [facility] to [facility] for ongoing rehab.  Had initially presented to [facility] 9/5 with dyspnea/weight gain. Started on IV diuretics without much improvement and worsened BUN/Cr (on baseline CKD 4-5). CRRT started 9/12, then transitioned to iHD. Had some issues with bradycardia, but resolved without cardiac intervention. Transferred to CIR 10/14.  Per notes, has been arranged to have permanent dialysis at [facility] clinic in [place] after finishes up rehab. His TDC was placed 9/23, does not have permanent access at this time.   Seen in room today - dislikes HD, but no specific complaints. No fever, chills, CP, dyspnea, abd pain, N/V/D.  Past Medical History:  Diagnosis Date   AKI (acute kidney injury) (HCC) 07/06/2018   CAD (coronary artery disease)    a. Remote PCI/stenting to LAD w PTCA Diagnoal. RCA 60%; b.  2005/2008 Cardiolites w/ reportedly mild ischemia in Diag territory-->Med rx.   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 04/2019 Echo: EF 55-60%, no rwma, nl RV fxn, RVSP 55.79mmHg. Mod dil LA. Mild-mod MR. Mod dil PA.   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 04/2019 Echo: RVSP 55.58mmHg.   Permanent atrial fibrillation (HCC)    a. CHA2DS2VASc = 5-->dose adjusted eliquis (age/creat).   Subdural hematoma (HCC)    a. 01/2021 in setting of fall s/p L frontotemporal  craniotomy.   Past Surgical History:  Procedure Laterality Date   BLEPHAROPLASTY     CARDIAC CATHETERIZATION     CORONARY STENT INTERVENTION     CRANIOTOMY Left 02/04/2021   Procedure: CRANIOTOMY FOR LEFT SUBDURAL  HEMATOMA EVACUATION;  Surgeon: Kyle Chris, MD;  Location: [facility] ORS;  Service: Neurosurgery;  Laterality: Left;   CYSTOSCOPY     DIALYSIS/PERMA CATHETER INSERTION N/A 12/14/2022   Procedure: DIALYSIS/PERMA CATHETER INSERTION;  Surgeon: Kyle Needy, MD;  Location: [facility] INVASIVE CV LAB;  Service: Cardiovascular;  Laterality: N/A;   DIALYSIS/PERMA CATHETER REPAIR N/A 12/16/2022   Procedure: DIALYSIS/PERMA CATHETER REPAIR;  Surgeon: Kyle Needy, MD;  Location: [facility] INVASIVE CV LAB;  Service: Cardiovascular;  Laterality: N/A;   HERNIA REPAIR     RIGHT HEART CATH N/A 12/02/2022   Procedure: RIGHT HEART CATH;  Surgeon: Kyle Morale, MD;  Location: [facility] INVASIVE CV LAB;  Service: Cardiovascular;  Laterality: N/A;   Family History  Problem Relation Age of Onset   Pancreatic cancer Mother    CAD Father    Diabetes Brother    Social History:  reports that he has quit smoking. He has never used smokeless tobacco. He reports that he does not currently use alcohol after a past usage of about 2.0 standard drinks of alcohol per week. He reports that he does not currently use drugs.  ROS: As per HPI otherwise negative.  Physical Exam: Vitals:   01/05/23 0304 01/05/23 0500 01/05/23 1029 01/05/23 1029  BP: (!) 123/59  (!) 154/61 (!) 154/61  Pulse: Marland Kitchen)  52   (!) 58  Resp: 18     Temp: (!) 97.5 F (36.4 C)   (!) 97.5 F (36.4 C)  TempSrc:    Oral  SpO2: 96%   100%  Weight:  88.7 kg       General: Elderly man, NAD. Sitting in wheelchair. Head: Normocephalic, B eyelid ectropion Neck: Supple without lymphadenopathy/masses. JVD not elevated. Lungs: Clear bilaterally to auscultation without wheezes, rales, or rhonchi. Breathing is unlabored. Heart: RRR with normal S1, S2. No  murmurs, rubs, or gallops appreciated. Abdomen: Soft, distended, non-tender Musculoskeletal:  Strength and tone appear normal for age. Lower extremities: Hypertrophic skin changes to BLE (s/p edema improvement) Neuro: Alert and oriented X 3. Moves all extremities spontaneously. Psych:  Responds to questions appropriately with a normal affect. Dialysis Access: Kyle Roberson  Allergies  Allergen Reactions   Atorvastatin Hives, Itching and Other (See Comments)    Other reaction(s): Other (See Comments)   Simvastatin Hives, Itching and Other (See Comments)    Other reaction(s): Other (See Comments)   Prior to Admission medications   Medication Sig Start Date End Date Taking? Authorizing Provider  acetaminophen (TYLENOL) 325 MG tablet Take 2 tablets (650 mg total) by mouth every 6 (six) hours as needed for mild pain (or Fever >/= 101). 01/04/23   Kyle Dys, MD  apixaban (ELIQUIS) 2.5 MG TABS tablet Take 1 tablet (2.5 mg total) by mouth 2 (two) times daily. 11/26/22   Kyle Roberson, Kyle Buckles, MD  Ascorbic Acid (VITAMIN C PO) Take 1 tablet by mouth in the morning.    [provider]  bisacodyl (DULCOLAX) 10 MG suppository Place 1 suppository (10 mg total) rectally daily as needed for moderate constipation. 01/04/23   Kyle Dys, MD  calcium carbonate (TUMS - DOSED IN MG ELEMENTAL CALCIUM) 500 MG chewable tablet Chew 4 tablets (800 mg of elemental calcium total) by mouth 3 (three) times daily with meals as needed for indigestion or heartburn. 01/04/23   Kyle Dys, MD  Cholecalciferol (VITAMIN D3) 1.25 MG (50000 UT) CAPS Take 1 tablet by mouth in the morning.    [provider]  docusate sodium (COLACE) 100 MG capsule Take 1 capsule (100 mg total) by mouth 2 (two) times daily. 01/04/23   Kyle Dys, MD  doxazosin (CARDURA) 1 MG tablet TAKE 1 TABLET(1 MG) BY MOUTH TWICE DAILY 10/05/22   Kyle Iba, MD  fluticasone furoate-vilanterol (BREO ELLIPTA) 200-25 MCG/ACT AEPB Inhale 1  puff into the lungs daily. 01/05/23   Kyle Dys, MD  furosemide (LASIX) 80 MG tablet Take 1 tablet (80 mg total) by mouth every Monday, Wednesday, and Friday. 01/06/23   Kyle Dys, MD  glipiZIDE (GLUCOTROL XL) 5 MG 24 hr tablet Take 1 tablet (5 mg total) by mouth daily with breakfast. Patient taking differently: Take 10 mg by mouth daily with breakfast. 07/27/22   [name], MD  hydrALAZINE (APRESOLINE) 25 MG tablet Take 1 tablet (25 mg total) by mouth 3 (three) times daily. 01/04/23   Kyle Dys, MD  hydrOXYzine (ATARAX) 25 MG tablet Take 1 tablet (25 mg total) by mouth 3 (three) times daily as needed for itching (symptoms unresponsive to topical cream). 01/04/23   Kyle Dys, MD  ipratropium-albuterol (DUONEB) 0.5-2.5 (3) MG/3ML SOLN Take 3 mLs by nebulization every 4 (four) hours as needed. 01/04/23   Kyle Dys, MD  isosorbide mononitrate (IMDUR) 60 MG 24 hr tablet Take 1 tablet (60 mg  total) by mouth daily. 01/05/23   Kyle Dys, MD  Multiple Vitamin (MULTIVITAMIN WITH MINERALS) TABS tablet Take 1 tablet by mouth in the morning.    [provider]  neomycin-polymyxin b-dexamethasone (MAXITROL) 3.5-10000-0.1 OINT Place 1 Application into the right eye 3 (three) times daily. 11/02/22   [provider]  nitroGLYCERIN (NITROLINGUAL) 0.4 MG/SPRAY spray Place 1 spray under the tongue as directed. 06/22/22 07/11/23  Kyle Roberson, Kyle Buckles, MD  Omega-3 Fatty Acids (FISH OIL PO) Take 1 capsule by mouth in the morning.    [provider]  ondansetron (ZOFRAN) 4 MG tablet Take 1 tablet (4 mg total) by mouth every 6 (six) hours as needed for nausea. 01/04/23   Kyle Dys, MD  oxyCODONE (OXY IR/ROXICODONE) 5 MG immediate release tablet Take 1 tablet (5 mg total) by mouth every 6 (six) hours as needed for moderate pain. 01/04/23   Kyle Dys, MD  pantoprazole (PROTONIX) 40 MG tablet Take 1 tablet (40 mg total) by mouth daily. 01/05/23   Kyle Dys,  MD  polyethylene glycol (MIRALAX / GLYCOLAX) 17 g packet Take 17 g by mouth daily as needed for mild constipation. 01/04/23   Kyle Dys, MD  rosuvastatin (CRESTOR) 10 MG tablet Take 1 tablet (10 mg total) by mouth 3 (three) times a week. 03/05/22   Kyle Roberson, Kyle Buckles, MD  traZODone (DESYREL) 50 MG tablet Take 0.5 tablets (25 mg total) by mouth at bedtime as needed for sleep. 01/04/23   Kyle Dys, MD   Current Facility-Administered Medications  Medication Dose Route Frequency Provider Last Rate Last Admin   acetaminophen (TYLENOL) tablet 325-650 mg  325-650 mg Oral Q4H PRN Setzer, Lynnell Jude, PA-C       apixaban Everlene Balls) tablet 2.5 mg  2.5 mg Oral BID Milinda Antis, PA-C   2.5 mg at 01/05/23 1027   ascorbic acid (VITAMIN C) tablet 500 mg  500 mg Oral BID Milinda Antis, PA-C   500 mg at 01/05/23 1028   Chlorhexidine Gluconate Cloth 2 % PADS 6 each  6 each Topical Q12H Fanny Dance, MD       cholecalciferol (VITAMIN D3) 25 MCG (1000 UNIT) tablet 2,000 Units  2,000 Units Oral Daily Milinda Antis, PA-C   2,000 Units at 01/05/23 1027   clotrimazole-betamethasone (LOTRISONE) cream   Topical BID Milinda Antis, PA-C   Given at 01/05/23 1035   diphenhydrAMINE-zinc acetate (BENADRYL) 2-0.1 % cream   Topical BID PRN Milinda Antis, PA-C       docusate sodium (COLACE) capsule 100 mg  100 mg Oral BID Milinda Antis, PA-C   100 mg at 01/05/23 1027   fluticasone furoate-vilanterol (BREO ELLIPTA) 200-25 MCG/ACT 1 puff  1 puff Inhalation Daily Milinda Antis, PA-C       [START ON 01/06/2023] furosemide (LASIX) tablet 80 mg  80 mg Oral Q M,W,F Setzer, Lynnell Jude, PA-C       guaiFENesin-dextromethorphan (ROBITUSSIN DM) 100-10 MG/5ML syrup 10 mL  10 mL Oral Q6H PRN Milinda Antis, PA-C       heparin sodium (porcine) injection 1,000 Units  1,000 Units Intravenous PRN Milinda Antis, PA-C       hydrALAZINE (APRESOLINE) tablet 25 mg  25 mg Oral TID Milinda Antis, PA-C   25 mg at  01/05/23 1029   hydrocerin (EUCERIN) cream   Topical TID Erick Colace, MD   Given at 01/05/23 1028   hydrOXYzine (ATARAX)  tablet 25 mg  25 mg Oral TID PRN Milinda Antis, PA-C       insulin aspart (novoLOG) injection 0-15 Units  0-15 Units Subcutaneous TID AC & HS Milinda Antis, PA-C   5 Units at 01/04/23 1816   insulin aspart (novoLOG) injection 3 Units  3 Units Subcutaneous TID WC Milinda Antis, PA-C   3 Units at 01/04/23 1816   insulin glargine-yfgn (SEMGLEE) injection 20 Units  20 Units Subcutaneous Daily Milinda Antis, PA-C   20 Units at 01/05/23 1028   isosorbide mononitrate (IMDUR) 24 hr tablet 60 mg  60 mg Oral Daily Milinda Antis, PA-C   60 mg at 01/05/23 1029   melatonin tablet 5 mg  5 mg Oral QHS Setzer, Lynnell Jude, PA-C       multivitamin (RENA-VIT) tablet 1 tablet  1 tablet Oral QHS Milinda Antis, PA-C   1 tablet at 01/04/23 2114   neomycin-polymyxin b-dexamethasone (MAXITROL) ophthalmic ointment 1 Application  1 Application Right Eye TID Milinda Antis, PA-C   1 Application at 01/05/23 1035   omega-3 acid ethyl esters (LOVAZA) capsule 1 g  1 g Oral Daily Milinda Antis, PA-C   1 g at 01/05/23 1026   ondansetron (ZOFRAN) tablet 4 mg  4 mg Oral Q6H PRN Milinda Antis, PA-C       Or   ondansetron [facility]) injection 4 mg  4 mg Intravenous Q6H PRN Setzer, Lynnell Jude, PA-C       pantoprazole (PROTONIX) EC tablet 40 mg  40 mg Oral Daily Milinda Antis, PA-C   40 mg at 01/05/23 1027   polyethylene glycol (MIRALAX / GLYCOLAX) packet 17 g  17 g Oral Daily PRN Milinda Antis, PA-C       [START ON 01/06/2023] rosuvastatin (CRESTOR) tablet 10 mg  10 mg Oral Once per day on Monday Wednesday Friday Milinda Antis, PA-C       sorbitol 70 % solution 30 mL  30 mL Oral Daily PRN Carlos Levering       Labs: Basic Metabolic Panel: Recent Labs  Lab 01/02/23 0523 01/03/23 0432 01/04/23 0505  NA 136 137 140  K 3.8 3.7 4.1  CL 98 97* 98  CO2 23 25 23   GLUCOSE  113* 80 66*  BUN 65* 43* 69*  CREATININE 5.32* 3.82* 5.71*  CALCIUM 7.9* 8.3* 8.4*  PHOS 6.1* 4.8* 7.2*   Liver Function Tests: Recent Labs  Lab 01/02/23 0523 01/03/23 0432 01/04/23 0505  ALBUMIN 3.1* 3.3* 3.1*   CBC: Recent Labs  Lab 12/31/22 0604 01/01/23 0620 01/02/23 0522 01/04/23 0505  WBC 9.6 9.3 9.7 10.2  NEUTROABS 7.2 7.0 7.1 7.3  HGB 11.4* 11.3* 10.9* 11.6*  HCT 34.3* 34.9* 32.7* 34.7*  MCV 90.0 91.6 89.8 89.4  PLT 216 205 199 222   CBG: Recent Labs  Lab 01/04/23 0851 01/04/23 1156 01/04/23 1649 01/04/23 2115 01/05/23 0704  GLUCAP 87 173* 223* 145* 90   Dialysis Orders:  Has been accepted to [facility] on TTS per notes (will start after CIR d/c) Establishing orders.   Assessment/Plan:  Debility: Admitted to CIR for ongoing rehab.  ESRD:  Continue HD per TTS schedule - for HD later today, UF as tolerated. Using [facility]. Will discuss with rehab team tomorrow to see if would be an option to have permanent access placed while here - may be hindrance to his therapy sessions.  Hypertension/volume: BP up, abdomen appears distended -  keep challenging weight down to establish dry weight.  Anemia of ESRD: Hgb > 11.5 at this time, no ESA needs.  Metabolic bone disease: Ca ok, Phos high. Does not appear that he was getting a binder - will start Renvela 1/meals.  Nutrition: Alb low, continue renal diet + prot supps. T2DM HFpEF  [name], PA-C 01/05/2023, 11:42 AM  [employer]

## 2023-01-05 NOTE — Plan of Care (Signed)
  Problem: RH Balance Goal: LTG: Patient will maintain dynamic sitting balance (OT) Description: LTG:  Patient will maintain dynamic sitting balance with assistance during activities of daily living (OT) Flowsheets (Taken 01/05/2023 1332) LTG: Pt will maintain dynamic sitting balance during ADLs with: Independent with assistive device Goal: LTG Patient will maintain dynamic standing with ADLs (OT) Description: LTG:  Patient will maintain dynamic standing balance with assist during activities of daily living (OT)  Flowsheets (Taken 01/05/2023 1332) LTG: Pt will maintain dynamic standing balance during ADLs with: Supervision/Verbal cueing   Problem: Sit to Stand Goal: LTG:  Patient will perform sit to stand in prep for activites of daily living with assistance level (OT) Description: LTG:  Patient will perform sit to stand in prep for activites of daily living with assistance level (OT) Flowsheets (Taken 01/05/2023 1332) LTG: PT will perform sit to stand in prep for activites of daily living with assistance level: Supervision/Verbal cueing   Problem: RH Grooming Goal: LTG Patient will perform grooming w/assist,cues/equip (OT) Description: LTG: Patient will perform grooming with assist, with/without cues using equipment (OT) Flowsheets (Taken 01/05/2023 1332) LTG: Pt will perform grooming with assistance level of: Independent with assistive device    Problem: RH Bathing Goal: LTG Patient will bathe all body parts with assist levels (OT) Description: LTG: Patient will bathe all body parts with assist levels (OT) Flowsheets (Taken 01/05/2023 1332) LTG: Pt will perform bathing with assistance level/cueing: Minimal Assistance - Patient > 75%   Problem: RH Dressing Goal: LTG Patient will perform upper body dressing (OT) Description: LTG Patient will perform upper body dressing with assist, with/without cues (OT). Flowsheets (Taken 01/05/2023 1332) LTG: Pt will perform upper body dressing with  assistance level of: Independent with assistive device Goal: LTG Patient will perform lower body dressing w/assist (OT) Description: LTG: Patient will perform lower body dressing with assist, with/without cues in positioning using equipment (OT) Flowsheets (Taken 01/05/2023 1332) LTG: Pt will perform lower body dressing with assistance level of: Minimal Assistance - Patient > 75%   Problem: RH Toileting Goal: LTG Patient will perform toileting task (3/3 steps) with assistance level (OT) Description: LTG: Patient will perform toileting task (3/3 steps) with assistance level (OT)  Flowsheets (Taken 01/05/2023 1332) LTG: Pt will perform toileting task (3/3 steps) with assistance level: Supervision/Verbal cueing   Problem: RH Functional Use of Upper Extremity Goal: LTG Patient will use RT/LT upper extremity as a (OT) Description: LTG: Patient will use right/left upper extremity as a stabilizer/gross assist/diminished/nondominant/dominant level with assist, with/without cues during functional activity (OT) Flowsheets (Taken 01/05/2023 1332) LTG: Use of upper extremity in functional activities:  RUE as diminished level  LUE as diminished level LTG: Pt will use upper extremity in functional activity with assistance level of: Independent with assistive device   Problem: RH Toilet Transfers Goal: LTG Patient will perform toilet transfers w/assist (OT) Description: LTG: Patient will perform toilet transfers with assist, with/without cues using equipment (OT) Flowsheets (Taken 01/05/2023 1332) LTG: Pt will perform toilet transfers with assistance level of: Supervision/Verbal cueing   Problem: RH Tub/Shower Transfers Goal: LTG Patient will perform tub/shower transfers w/assist (OT) Description: LTG: Patient will perform tub/shower transfers with assist, with/without cues using equipment (OT) Flowsheets (Taken 01/05/2023 1332) LTG: Pt will perform tub/shower stall transfers with assistance level  of: Supervision/Verbal cueing

## 2023-01-05 NOTE — Progress Notes (Signed)
   01/05/23 2234  Vitals  Temp 98.4 F (36.9 C)  Temp Source Axillary  BP 116/64  MAP (mmHg) 81  BP Location Left Arm  BP Method Automatic  Patient Position (if appropriate) Lying  Pulse Rate 63  Pulse Rate Source Monitor  ECG Heart Rate 62  Resp 13  Oxygen Therapy  SpO2 97 %  O2 Device Room Air  During Treatment Monitoring  Blood Flow Rate (mL/min) 349 mL/min  Arterial Pressure (mmHg) -205.45 mmHg  Venous Pressure (mmHg) 166.66 mmHg  TMP (mmHg) 11.92 mmHg  Ultrafiltration Rate (mL/min) 598 mL/min  Dialysate Flow Rate (mL/min) 300 ml/min  Dialysate Potassium Concentration 3  Dialysate Calcium Concentration 2.5  Duration of HD Treatment -hour(s) 3.5 hour(s)  Cumulative Fluid Removed (mL) per Treatment  1500.12  HD Safety Checks Performed Yes  Intra-Hemodialysis Comments Tx completed  Post Treatment  Dialyzer Clearance Lightly streaked  Liters Processed 76  Fluid Removed (mL) 1500 mL  Tolerated HD Treatment Yes  Post-Hemodialysis Comments pt Stable  Hemodialysis Catheter Right Internal jugular Double lumen Permanent (Tunneled)  Placement Date/Time: 12/16/22 1448   Serial / Lot #: [id number]  Expiration Date: 06/21/27  Time Out: Correct patient;Correct site;Correct procedure  Maximum sterile barrier precautions: Hand hygiene;Cap;Mask;Sterile gown;Sterile gloves;Sterile probe c...  Site Condition No complications  Blue Lumen Status Heparin locked  Red Lumen Status Heparin locked  Catheter fill solution Heparin 1000 units/ml  Catheter fill volume (Arterial) 1.6 cc  Catheter fill volume (Venous) 1.6  Dressing Type Transparent  Dressing Status Clean, Dry, Intact  Drainage Description None  Dressing Change Due 01/08/23  Post treatment catheter status Capped and Clamped   Pt dialysis uneventful hand off given to [employer].

## 2023-01-05 NOTE — Progress Notes (Signed)
Per pts wife, pt blood pressure will drop to the 80's when oob and changing positions. Wife wanted this nurse to make the care team aware of his low BP's while being evaluated today in therapy. Call bell in reach, bed alarm on.

## 2023-01-05 NOTE — Progress Notes (Signed)
Occupational Therapy Session Note  Patient Details  Name: Kyle Roberson MRN: [id number] Date of Birth: Sep 17, 1940  Today's Date: 01/05/2023 OT Individual Time: 1105-1150 OT Individual Time Calculation (min): 45 min    Short Term Goals: Week 1:  Eval pending   Skilled Therapeutic Interventions/Progress Updates:  Pt received sitting in [facility] for skilled OT session with focus on standing tolerance/balance. Pt agreeable to interventions, demonstrating overall pleasant mood. Pt with no reports of pain. OT offering intermediate rest breaks and positioning suggestions throughout session to address potential pain/fatigue and maximize participation/safety in session.   Pt performs all STS transfers with CGA + RW, cuing required for hand placement to slow descent onto WC. Pt tolerates x3 rounds, each lasting ~3 mins, with CGA-close supervision + unilateral support on RW. Multimodal cuing provided for terminal hip extension, pt not receptive this session. Pt performs stand-step transfer from WC>EOB with CGA + RW, requiring A for BLE management.   Pt remained resting in bed with all immediate needs met at end of session. Pt continues to be appropriate for skilled OT intervention to promote further functional independence.   Therapy Documentation Precautions:  Restrictions Weight Bearing Restrictions: No   Therapy/Group: Individual Therapy  Lou Cal, OTR/L, MSOT  01/05/2023, 11:19 AM

## 2023-01-05 NOTE — Evaluation (Addendum)
Occupational Therapy Assessment and Plan  Patient Details  Name: Tobenna Colle MRN: [id number] Date of Birth: 03-29-1940  OT Diagnosis: abnormal posture, cognitive deficits, muscle weakness (generalized), and other lymphedema Rehab Potential: Rehab Potential (ACUTE ONLY): Good ELOS: 12-14 days   Today's Date: 01/05/2023 OT Individual Time: 0900-1015 OT Individual Time Calculation (min): 75 min     Hospital Problem: Principal Problem:   Debility   Past Medical History:  Past Medical History:  Diagnosis Date   AKI (acute kidney injury) (HCC) 07/06/2018   CAD (coronary artery disease)    a. Remote PCI/stenting to LAD w PTCA Diagnoal. RCA 60%; b.  2005/2008 Cardiolites w/ reportedly mild ischemia in Diag territory-->Med rx.   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 04/2019 Echo: EF 55-60%, no rwma, nl RV fxn, RVSP 55.2mmHg. Mod dil LA. Mild-mod MR. Mod dil PA.   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 04/2019 Echo: RVSP 55.39mmHg.   Permanent atrial fibrillation (HCC)    a. CHA2DS2VASc = 5-->dose adjusted eliquis (age/creat).   Subdural hematoma (HCC)    a. 01/2021 in setting of fall s/p L frontotemporal craniotomy.   Past Surgical History:  Past Surgical History:  Procedure Laterality Date   BLEPHAROPLASTY     CARDIAC CATHETERIZATION     CORONARY STENT INTERVENTION     CRANIOTOMY Left 02/04/2021   Procedure: CRANIOTOMY FOR LEFT SUBDURAL  HEMATOMA EVACUATION;  Surgeon: Lucy Chris, MD;  Location: [facility] ORS;  Service: Neurosurgery;  Laterality: Left;   CYSTOSCOPY     DIALYSIS/PERMA CATHETER INSERTION N/A 12/14/2022   Procedure: DIALYSIS/PERMA CATHETER INSERTION;  Surgeon: Annice Needy,  MD;  Location: [facility] INVASIVE CV LAB;  Service: Cardiovascular;  Laterality: N/A;   DIALYSIS/PERMA CATHETER REPAIR N/A 12/16/2022   Procedure: DIALYSIS/PERMA CATHETER REPAIR;  Surgeon: Annice Needy, MD;  Location: [facility] INVASIVE CV LAB;  Service: Cardiovascular;  Laterality: N/A;   HERNIA REPAIR     RIGHT HEART CATH N/A 12/02/2022   Procedure: RIGHT HEART CATH;  Surgeon: Laurey Morale, MD;  Location: [facility] INVASIVE CV LAB;  Service: Cardiovascular;  Laterality: N/A;    Assessment & Plan Clinical Impression: Leonitus Rafalski is an 82 year old male who presented to the emergency department on 11/26/2022 to been evaluated by his nephrologist for volume overload. He was admitted to the hospitalist service for acute on chronic diastolic heart failure started on Lasix. 2D echo was performed on 9/06 that revealed a left ventricular ejection fraction of 55 to 60% with normal left ventricle function and normal right ventricle function. He had severely elevated pulmonary arterial systolic pressure. He was seen in consultation that day by Dr. Elwyn Lade with failure team. His past medical history is significant for chronic diastolic congestive heart failure, coronary artery disease status post remote PCI to the LAD and diagonal, chronic kidney disease stage IV, hypertension, hyperlipidemia, permanent atrial fibrillation, traumatic subdural hematoma status post craniotomy in November 2022. He is maintained on Eliquis 2.5 mg twice daily dosed appropriate for age and renal function. Nephrology was consulted on 9/07. Patient's cardiologist Dr. Mariah Milling was also consulted. His urine output significantly dropped on 9/11 and BUN and creatinine continued upward trend. He remained in rate  controlled atrial fibrillation. Rate averaging 40 to 50 bpm with some overnight bradycardia 30s. He developed cardiorenal syndrome and cardiogenic shock. Converted to heparin infusion. He underwent placement of dialysis catheter initiation of CRRT.  Palliative care consultation obtained. 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 Ultimately transition to intermittent hemodialysis and is now on a Tuesday, Thursday, Saturday schedule. Eliquis was resumed on 9/24. He underwent placement of tunneled hemodialysis catheter via the right IJ by Dr. Wyn Quaker on 9/25. Per cardiology, no role for PPM currently in light of need for HD and additional nidus for infection. Patient ambulated 59ft with Mod A initially, progressing to Min A with increased ambulation distance using rolling walker. The patient requires inpatient medicine and rehabilitation evaluations and services for ongoing dysfunction secondary to debility.   Patient currently requires max with basic self-care skills and IADL secondary to muscle weakness, decreased cardiorespiratoy endurance, decreased coordination and decreased motor planning, decreased problem solving and decreased memory, and decreased sitting balance, decreased standing balance, decreased postural control, and decreased balance strategies.  Prior to hospitalization, patient could complete BADL's with min A from wife and overall mod I for basic mobility with quad cane.   Patient will benefit from skilled intervention to decrease level of assist with basic self-care skills, increase independence with basic self-care skills, and increase level of independence with iADL prior to discharge home with care partner.  Anticipate patient will require 24 hour supervision and minimal physical assistance and follow up home health.  OT - End of Session Activity Tolerance: Decreased this session Endurance Deficit: Yes Endurance Deficit Description: fatigued with minimal activity OT Assessment Rehab Potential (ACUTE ONLY): Good OT Barriers to Discharge: Inaccessible home environment;Home environment access/layout;Other (comments) OT Barriers to Discharge Comments: new  HD, stall shower with step in OT Patient demonstrates impairments in the following area(s): Balance;Cognition;Endurance;Motor;Safety;Skin Integrity OT Basic ADL's Functional Problem(s): Grooming;Bathing;Dressing;Toileting OT Advanced ADL's Functional Problem(s): Simple Meal Preparation;Laundry;Light Housekeeping OT Transfers Functional Problem(s): Toilet;Tub/Shower OT Additional Impairment(s): Fuctional Use of Upper Extremity OT Plan OT Intensity: Minimum of 1-2 x/day, 45 to 90 minutes OT Frequency: 5 out of 7 days OT Duration/Estimated Length of Stay: 12-14 days OT Treatment/Interventions: Balance/vestibular training;Discharge planning;Functional electrical stimulation;Pain management;Self Care/advanced ADL retraining;Therapeutic Activities;UE/LE Coordination activities;Cognitive remediation/compensation;Disease mangement/prevention;Functional mobility training;Patient/family education;Skin care/wound managment;Therapeutic Exercise;Visual/perceptual remediation/compensation;Community reintegration;DME/adaptive equipment instruction;Neuromuscular re-education;Psychosocial support;Splinting/orthotics;UE/LE Strength taining/ROM;Wheelchair propulsion/positioning OT Self Feeding Anticipated Outcome(s): indep OT Basic Self-Care Anticipated Outcome(s): mod I UB selfcare, min A LB selfcare, S for transfers/mobility OT Toileting Anticipated Outcome(s): [employer] OT Bathroom Transfers Anticipated Outcome(s): Supervsion OT Recommendation Recommendations for Other Services: Neuropsych consult;Therapeutic Recreation consult Therapeutic Recreation Interventions: Pet therapy;Kitchen group;Stress management;Outing/community reintergration Patient destination: Home Follow Up Recommendations: Home health OT Equipment Recommended: Tub/shower bench Equipment Details: if unable to step into stall by d/c   OT Evaluation Precautions/Restrictions  Precautions Precautions: Fall Restrictions Weight Bearing  Restrictions: No  Vital Signs Therapy Vitals Temp: (!) 97.5 F (36.4 C) Temp Source: Oral Pulse Rate: (!) 58 BP: (!) 154/61 Patient Position (if appropriate): Sitting Oxygen Therapy SpO2: 100 % Pain Pain Assessment Pain Scale: 0-10 Pain Score: 0-No pain Multiple Pain Sites: No Home Living/Prior Functioning Home Living Family/patient expects to be discharged to:: Private residence Living Arrangements: Spouse/significant other Available Help at Discharge: Family, Available PRN/intermittently (has 2 sisters who can check in on him) Type of Home: House Home Access: Ramped entrance Home Layout: One level Bathroom Shower/Tub: Health visitor: Standard Bathroom Accessibility:  (unsure) Additional  Comments: would occasionally use SPC or RW but not often. Has RW, cane, WC, and shower seat  Lives With: Spouse, Family Prior Function Level of Independence: Independent with basic ADLs, Independent with transfers, Independent with homemaking with ambulation, Independent with gait  Able to Take Stairs?: No Driving: No (hasn't driven in over a year) Vision Baseline Vision/History: 0 No visual deficits Ability to See in Adequate Light: 0 Adequate Patient Visual Report: No change from baseline Vision Assessment?: No apparent visual deficits Perception  Perception: Within Functional Limits Praxis Praxis: WFL Cognition Cognition Overall Cognitive Status: Within Functional Limits for tasks assessed Arousal/Alertness: Awake/alert Memory: Appears intact Awareness: Impaired Problem Solving: Appears intact Safety/Judgment: Impaired Comments: slightly decreased insight into deficits Brief Interview for Mental Status (BIMS) Repetition of Three Words (First Attempt): 3 Temporal Orientation: Year: Correct Temporal Orientation: Month: Accurate within 5 days Temporal Orientation: Day: Correct Recall: "Sock": Yes, no cue required Recall: "Blue": Yes, after cueing ("a  color") Recall: "Bed": Yes, no cue required BIMS Summary Score: 14 Sensation Sensation Light Touch: Impaired Detail Light Touch Impaired Details: Impaired LLE;Impaired RLE Hot/Cold: Not tested Proprioception: Appears Intact Stereognosis: Not tested Additional Comments: decreased sensation from bilateral ankles and below. Hard, callused skin Coordination Gross Motor Movements are Fluid and Coordinated: Yes Fine Motor Movements are Fluid and Coordinated: No Coordination and Movement Description: global weakness/deconditioning Finger Nose Finger Test: bilateral tremors and decreased speed bilaterally Heel Shin Test: decreased coordination bilaterally 9 Hole Peg Test: 9 HPT R 1.23 min L 54 sec Motor  Motor Motor: Within Functional Limits Motor - Skilled Clinical Observations: global weakness/deconditioning  Trunk/Postural Assessment  Cervical Assessment Cervical Assessment: Exceptions to [facility] (forward head) Thoracic Assessment Thoracic Assessment: Exceptions to [facility] (mild kyphosis) Lumbar Assessment Lumbar Assessment: Exceptions to [facility] (posterior pelvic tilt) Postural Control Postural Control: Within Functional Limits  Balance Balance Balance Assessed: Yes Static Sitting Balance Static Sitting - Balance Support: Feet supported;Bilateral upper extremity supported Static Sitting - Level of Assistance: 5: Stand by assistance (supervision) Dynamic Sitting Balance Dynamic Sitting - Balance Support: Feet supported;No upper extremity supported Dynamic Sitting - Level of Assistance: 5: Stand by assistance (supervision) Static Standing Balance Static Standing - Balance Support: No upper extremity supported;During functional activity Static Standing - Level of Assistance: 3: Mod assist Dynamic Standing Balance Dynamic Standing - Balance Support: No upper extremity supported;During functional activity Dynamic Standing - Level of Assistance: 3: Mod assist Dynamic Standing - Comments:  transfers Extremity/Trunk Assessment RUE Assessment RUE Assessment: Exceptions to [facility] General Strength Comments: 22 lbs R, grossly 3+/5 LUE Assessment LUE Assessment: Exceptions to [facility] General Strength Comments: 40 lbs L, grossly 3+/5  Care Tool Care Tool Self Care Eating   Eating Assist Level: Set up assist    Oral Care    Oral Care Assist Level: Minimal Assistance - Patient > 75%    Bathing   Body parts bathed by patient: Right arm;Left arm;Chest;Abdomen;Face;Right upper leg;Right lower leg Body parts bathed by helper: Left upper leg;Left lower leg;Buttocks   Assist Level: Maximal Assistance - Patient 24 - 49%    Upper Body Dressing(including orthotics)   What is the patient wearing?: Hospital gown only   Assist Level: Minimal Assistance - Patient > 75%    Lower Body Dressing (excluding footwear)   What is the patient wearing?: Hospital gown only;Incontinence brief Assist for lower body dressing: Maximal Assistance - Patient 25 - 49%    Putting on/Taking off footwear   What is the patient wearing?: Non-skid slipper socks Assist  for footwear: Dependent - Patient 0%       Care Tool Toileting Toileting activity   Assist for toileting: Maximal Assistance - Patient 25 - 49%     Care Tool Bed Mobility Roll left and right activity   Roll left and right assist level: Moderate Assistance - Patient 50 - 74%    Sit to lying activity   Sit to lying assist level: Moderate Assistance - Patient 50 - 74%    Lying to sitting on side of bed activity   Lying to sitting on side of bed assist level: the ability to move from lying on the back to sitting on the side of the bed with no back support.: Moderate Assistance - Patient 50 - 74%     Care Tool Transfers Sit to stand transfer   Sit to stand assist level: Moderate Assistance - Patient 50 - 74%    Chair/bed transfer   Chair/bed transfer assist level: Moderate Assistance - Patient 50 - 74%     Toilet transfer   Assist  Level: Moderate Assistance - Patient 50 - 74%     Care Tool Cognition  Expression of Ideas and Wants Expression of Ideas and Wants: 4. Without difficulty (complex and basic) - expresses complex messages without difficulty and with speech that is clear and easy to understand  Understanding Verbal and Non-Verbal Content Understanding Verbal and Non-Verbal Content: 4. Understands (complex and basic) - clear comprehension without cues or repetitions   Memory/Recall Ability Memory/Recall Ability : Current season;Staff names and faces   Refer to Care Plan for Long Term Goals  SHORT TERM GOAL WEEK 1 OT Short Term Goal 1 (Week 1): Pt will stand up to 2 min for grooming sink side level OT Short Term Goal 2 (Week 1): Pt will complete LB self care with AE as needed with mod A OT Short Term Goal 3 (Week 1): Pt will complete toilet transfer with CGA using LRAD  Recommendations for other services: Neuropsych and Therapeutic Recreation  Pet therapy, Kitchen group, Stress management, and Outing/community reintegration   Skilled Therapeutic Intervention ADL ADL Eating: Set up Where Assessed-Eating: Wheelchair Grooming: Minimal assistance Upper Body Bathing: Minimal assistance Where Assessed-Upper Body Bathing: Sitting at sink Lower Body Bathing: Maximal assistance Where Assessed-Lower Body Bathing: Standing at sink;Sitting at sink Upper Body Dressing: Minimal assistance Where Assessed-Upper Body Dressing: Sitting at sink Lower Body Dressing: Maximal assistance Where Assessed-Lower Body Dressing: Standing at sink;Sitting at sink Toileting: Moderate assistance Where Assessed-Toileting: Bedside Commode Toilet Transfer: Maximal assistance Toilet Transfer Method: Stand pivot Toilet Transfer Equipment: Grab bars;Raised toilet seat Tub/Shower Transfer: Unable to assess Film/video editor: Unable to assess ADL Comments: mod A SPT to toilet, max A LB, min A UB, grooming set up w/c level  sinkside mod A SPT to toilet, max A LB, min A UB, grooming set up w/c level sinkside   Mobility  Bed Mobility Bed Mobility: Rolling Right;Supine to Sit Rolling Right: Moderate Assistance - Patient 50-74% Supine to Sit: Moderate Assistance - Patient 50-74% Transfers Sit to Stand: Moderate Assistance - Patient 50-74% Stand to Sit: Minimal Assistance - Patient > 75%  OT Intervention/Treatment:   Pt seen for full initial OT evaluation and training session this am. Pt in w/c upon OT arrival. OT introduced role of therapy and purpose of session. Pt open to all presented assessment and training this visit. OT assisted and assessed ADL's, mobility, vision, sensation. cognition/lang, G/FMC, strength and balance throughout session. See above for levels. Pt  will benefit from skilled OT services at CIR to maximize function and safety with recommendation to return home with mod I for UB and min A LB BADL's and S for higher level mobility with most likely HHOT services upon d/c home. Pt left at end of session in w/c  with LE's elevated and chair alarm set, tray table and nurse call bell within reach.   Discharge Criteria: Patient will be discharged from OT if patient refuses treatment 3 consecutive times without medical reason, if treatment goals not met, if there is a change in medical status, if patient makes no progress towards goals or if patient is discharged from hospital.  The above assessment, treatment plan, treatment alternatives and goals were discussed and mutually agreed upon: by patient  Vicenta Dunning 01/05/2023, 1:31 PM

## 2023-01-05 NOTE — Progress Notes (Signed)
Patient ID: Kyle Roberson, male   DOB: 1940/08/01, 82 y.o.   MRN: [id number] Met with the patient to review current situation; debility  with ESRD; HF and > 30# wt gain, edema. Patient noted he wanted to go home ASAP. Frustrated with his health decline and being in the hospital over a month; ready to get home.  Discussed secondary risks including HF, HTN, DM (LDL 60/Trig 98) on Crestor DM (A1C 7.3) and ESRD; new HD. Noted wife will be able to take care of him. Reported wife will manage medications, diet and he "may or may not follow" renal diet at discharge or daily weight checks. Reviewed need for monitoring weight gain and when to call the MD or call 911. Reported calling 911 is a "waste of time"; "wife will get him where he needs to go". Reported wife manages medications and unable to recall what he was taking. Reviewed dietary modification tips for renal/HH diet; noted he understood " throw out all of the good stuff"; no potatoes, no cheese, etc and that his plan was to get a "big bowl of gravy and biscuits" to eat upon discharge.Reviewed skin care; creams for legs. Continue to follow along to address educational needs to facilitate preparation for discharge. Pamelia Hoit

## 2023-01-05 NOTE — Evaluation (Signed)
Physical Therapy Assessment and Plan  Patient Details  Name: Kyle Roberson MRN: [id number] Date of Birth: 1940/11/09  PT Diagnosis: Abnormal posture, Abnormality of gait, Difficulty walking, Edema, Impaired sensation, Muscle weakness, and Pain in RLE Rehab Potential: Good ELOS: 2 weeks   Today's Date: 01/05/2023 PT Individual Time: 0730-0840 PT Individual Time Calculation (min): 70 min    Hospital Problem: Principal Problem:   Debility   Past Medical History:  Past Medical History:  Diagnosis Date   AKI (acute kidney injury) (HCC) 07/06/2018   CAD (coronary artery disease)    a. Remote PCI/stenting to LAD w PTCA Diagnoal. RCA 60%; b.  2005/2008 Cardiolites w/ reportedly mild ischemia in Diag territory-->Med rx.   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 04/2019 Echo: EF 55-60%, no rwma, nl RV fxn, RVSP 55.92mmHg. Mod dil LA. Mild-mod MR. Mod dil PA.   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 04/2019 Echo: RVSP 55.74mmHg.   Permanent atrial fibrillation (HCC)    a. CHA2DS2VASc = 5-->dose adjusted eliquis (age/creat).   Subdural hematoma (HCC)    a. 01/2021 in setting of fall s/p L frontotemporal craniotomy.   Past Surgical History:  Past Surgical History:  Procedure Laterality Date   BLEPHAROPLASTY     CARDIAC CATHETERIZATION     CORONARY STENT INTERVENTION     CRANIOTOMY Left 02/04/2021   Procedure: CRANIOTOMY FOR LEFT SUBDURAL  HEMATOMA EVACUATION;  Surgeon: Lucy Chris, MD;  Location: [facility] ORS;  Service: Neurosurgery;  Laterality: Left;   CYSTOSCOPY     DIALYSIS/PERMA CATHETER INSERTION N/A 12/14/2022   Procedure: DIALYSIS/PERMA CATHETER INSERTION;  Surgeon: Annice Needy, MD;   Location: [facility] INVASIVE CV LAB;  Service: Cardiovascular;  Laterality: N/A;   DIALYSIS/PERMA CATHETER REPAIR N/A 12/16/2022   Procedure: DIALYSIS/PERMA CATHETER REPAIR;  Surgeon: Annice Needy, MD;  Location: [facility] INVASIVE CV LAB;  Service: Cardiovascular;  Laterality: N/A;   HERNIA REPAIR     RIGHT HEART CATH N/A 12/02/2022   Procedure: RIGHT HEART CATH;  Surgeon: Laurey Morale, MD;  Location: [facility] INVASIVE CV LAB;  Service: Cardiovascular;  Laterality: N/A;    Assessment & Plan Clinical Impression: Patient is a 82 y.o. year old male who presented to the emergency department on 11/26/2022 to been evaluated by his nephrologist for volume overload. He was admitted to the hospitalist service for acute on chronic diastolic heart failure started on Lasix. 2D echo was performed on 9/06 that revealed a left ventricular ejection fraction of 55 to 60% with normal left ventricle function and normal right ventricle function. He had severely elevated pulmonary arterial systolic pressure. He was seen in consultation that day by Dr. Elwyn Lade with failure team. His past medical history is significant for chronic diastolic congestive heart failure, coronary artery disease status post remote PCI to the LAD and diagonal, chronic kidney disease stage IV, hypertension, hyperlipidemia, permanent atrial fibrillation, traumatic subdural hematoma status post craniotomy in November 2022. He is maintained on Eliquis 2.5 mg twice daily dosed appropriate for age and renal function. Nephrology was consulted on 9/07. Patient's cardiologist Dr. Mariah Milling was also consulted. His urine output significantly dropped on 9/11 and BUN and creatinine continued upward trend. He  remained in rate controlled atrial fibrillation. Rate averaging 40 to 50 bpm with some overnight bradycardia 30s. He developed cardiorenal syndrome and cardiogenic shock. Converted to heparin infusion. He underwent placement of dialysis catheter initiation of CRRT. Palliative  care consultation obtained. 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 Ultimately transition to intermittent hemodialysis and is now on a Tuesday, Thursday, Saturday schedule. Eliquis was resumed on 9/24. He underwent placement of tunneled hemodialysis catheter via the right IJ by Dr. Wyn Quaker on 9/25. Per cardiology, no role for PPM currently in light of need for HD and additional nidus for infection. Patient ambulated 47ft with Mod A initially, progressing to Min A with increased ambulation distance using rolling walker. The patient requires inpatient medicine and rehabilitation evaluations and services for ongoing dysfunction secondary to debility.   Patient currently requires mod with mobility secondary to muscle weakness, decreased cardiorespiratoy endurance, and decreased standing balance, decreased postural control, and decreased balance strategies.  Prior to hospitalization, patient was independent  with mobility and lived with Spouse, Family in a House home.  Home access is  Ramped entrance.  Patient will benefit from skilled PT intervention to maximize safe functional mobility, minimize fall risk, and decrease caregiver burden for planned discharge home with intermittent assist.  Anticipate patient will benefit from follow up [facility] at discharge.  PT - End of Session Activity Tolerance: Tolerates 30+ min activity with multiple rests Endurance Deficit: Yes Endurance Deficit Description: fatigued with minimal activity PT Assessment Rehab Potential (ACUTE/IP ONLY): Good PT Barriers to Discharge: Decreased caregiver support;Hemodialysis;Other (comments) PT Barriers to Discharge Comments: pain, wife in/out during the day, global weakness/deconditioning PT Patient demonstrates impairments in the following area(s): Balance;Edema;Endurance;Nutrition;Pain;Sensory;Skin Integrity;Safety PT Transfers Functional Problem(s): Bed  Mobility;Bed to Chair;Car;Furniture PT Locomotion Functional Problem(s): Ambulation;Wheelchair Mobility;Stairs PT Plan PT Intensity: Minimum of 1-2 x/day ,45 to 90 minutes PT Frequency: 5 out of 7 days PT Duration Estimated Length of Stay: 2 weeks PT Treatment/Interventions: Ambulation/gait training;Discharge planning;Functional mobility training;Psychosocial support;Therapeutic Activities;Visual/perceptual remediation/compensation;Balance/vestibular training;Disease management/prevention;Neuromuscular re-education;Skin care/wound management;Therapeutic Exercise;Wheelchair propulsion/positioning;Cognitive remediation/compensation;DME/adaptive equipment instruction;Pain management;Splinting/orthotics;UE/LE Strength taining/ROM;Community reintegration;Patient/family education;Stair training;UE/LE Coordination activities;Functional electrical stimulation PT Transfers Anticipated Outcome(s): Mod I with LRAD PT Locomotion Anticipated Outcome(s): supervision with LRAD PT Recommendation Follow Up Recommendations: Home health PT Patient destination: Home Equipment Recommended: To be determined Equipment Details: has RW, cane, and WC  PT Evaluation Precautions/Restrictions Precautions Precautions: Fall Restrictions Weight Bearing Restrictions: No Pain Interference Pain Interference Pain Effect on Sleep: 1. Rarely or not at all Pain Interference with Therapy Activities: 1. Rarely or not at all Pain Interference with Day-to-Day Activities: 1. Rarely or not at all Home Living/Prior Functioning Home Living Living Arrangements: Spouse/significant other Available Help at Discharge: Family;Available PRN/intermittently (has 2 sisters who can check in on him) Type of Home: House Home Access: Ramped entrance Home Layout: One level Bathroom Shower/Tub: Health visitor: Standard Bathroom Accessibility:  (unsure) Additional Comments: would occasionally use SPC or RW but not often. Has  RW, cane, WC, and shower seat  Lives With: Spouse;Family Prior Function Level of Independence: Independent with basic ADLs;Independent with transfers;Independent with homemaking with ambulation;Independent with gait  Able to Take Stairs?: No Driving: No (hasn't driven in over a year) Vision/Perception  Vision - History Ability to See in Adequate Light: 0 Adequate  Cognition Overall Cognitive Status: Within Functional Limits for tasks assessed Arousal/Alertness: Awake/alert Orientation Level: Oriented X4 Memory: Appears intact Awareness: Impaired Problem Solving: Appears intact Safety/Judgment: Impaired Comments: slightly decreased  insight into deficits Sensation Sensation Light Touch: Impaired Detail Light Touch Impaired Details: Impaired LLE;Impaired RLE Hot/Cold: Not tested Proprioception: Appears Intact Stereognosis: Not tested Additional Comments: decreased sensation from bilateral ankles and below. Hard, callused skin Coordination Gross Motor Movements are Fluid and Coordinated: Yes Fine Motor Movements are Fluid and Coordinated: No Coordination and Movement Description: global weakness/deconditioning Finger Nose Finger Test: bilateral tremors and decreased speed bilaterally Heel Shin Test: decreased coordination bilaterally Motor  Motor Motor: Within Functional Limits Motor - Skilled Clinical Observations: global weakness/deconditioning  Trunk/Postural Assessment  Cervical Assessment Cervical Assessment: Exceptions to [facility] (forward head) Thoracic Assessment Thoracic Assessment: Exceptions to [facility] (mild kyphosis) Lumbar Assessment Lumbar Assessment: Exceptions to [facility] (posterior pelvic tilt) Postural Control Postural Control: Within Functional Limits  Balance Balance Balance Assessed: Yes Static Sitting Balance Static Sitting - Balance Support: Feet supported;Bilateral upper extremity supported Static Sitting - Level of Assistance: 5: Stand by assistance  (supervision) Dynamic Sitting Balance Dynamic Sitting - Balance Support: Feet supported;No upper extremity supported Dynamic Sitting - Level of Assistance: 5: Stand by assistance (supervision) Static Standing Balance Static Standing - Balance Support: No upper extremity supported;During functional activity Static Standing - Level of Assistance: 3: Mod assist Dynamic Standing Balance Dynamic Standing - Balance Support: No upper extremity supported;During functional activity Dynamic Standing - Level of Assistance: 3: Mod assist Dynamic Standing - Comments: transfers Extremity Assessment  RLE Assessment RLE Assessment: Exceptions to [facility] General Strength Comments: tested sitting EOB RLE Strength Right Hip Flexion: 3-/5 Right Hip ABduction: 3-/5 Right Hip ADduction: 3-/5 Right Knee Flexion: 3+/5 Right Knee Extension: 3-/5 Right Ankle Plantar Flexion: 3+/5 LLE Assessment LLE Assessment: Exceptions to [facility] General Strength Comments: tested sitting EOB LLE Strength Left Hip Flexion: 3-/5 Left Hip ABduction: 3-/5 Left Hip ADduction: 3-/5 Left Knee Flexion: 3+/5 Left Knee Extension: 3-/5 Left Ankle Plantar Flexion: 3+/5  Care Tool Care Tool Bed Mobility Roll left and right activity   Roll left and right assist level: Moderate Assistance - Patient 50 - 74%    Sit to lying activity        Lying to sitting on side of bed activity   Lying to sitting on side of bed assist level: the ability to move from lying on the back to sitting on the side of the bed with no back support.: Moderate Assistance - Patient 50 - 74%     Care Tool Transfers Sit to stand transfer   Sit to stand assist level: Moderate Assistance - Patient 50 - 74%    Chair/bed transfer   Chair/bed transfer assist level: Moderate Assistance - Patient 50 - 74%     Scientist, research (physical sciences) transfer activity did not occur: Safety/medical concerns (fatigue, pain)        Care Tool  Locomotion Ambulation Ambulation activity did not occur: Safety/medical concerns (fatigue, pain)        Walk 10 feet activity Walk 10 feet activity did not occur: Safety/medical concerns (fatigue, pain)       Walk 50 feet with 2 turns activity Walk 50 feet with 2 turns activity did not occur: Safety/medical concerns (fatigue, pain)      Walk 150 feet activity Walk 150 feet activity did not occur: Safety/medical concerns (fatigue, pain)      Walk 10 feet on uneven surfaces activity Walk 10 feet on uneven surfaces activity did not occur: Safety/medical concerns (fatigue, pain)      Stairs Stair activity did not  occur: Safety/medical concerns (fatigue, pain)        Walk up/down 1 step activity Walk up/down 1 step or curb (drop down) activity did not occur: Safety/medical concerns (fatigue, pain)      Walk up/down 4 steps activity Walk up/down 4 steps activity did not occur: Safety/medical concerns (fatigue, pain)      Walk up/down 12 steps activity Walk up/down 12 steps activity did not occur: Safety/medical concerns (fatigue, pain)      Pick up small objects from floor Pick up small object from the floor (from standing position) activity did not occur: Safety/medical concerns (fatigue, pain, decreased balance)      Wheelchair Is the patient using a wheelchair?: Yes Type of Wheelchair: Manual Wheelchair activity did not occur: Safety/medical concerns (pain, fatigue)      Wheel 50 feet with 2 turns activity Wheelchair 50 feet with 2 turns activity did not occur: Safety/medical concerns (pain, fatigue)    Wheel 150 feet activity Wheelchair 150 feet activity did not occur: Safety/medical concerns (pain, fatigue)      Refer to Care Plan for Long Term Goals  SHORT TERM GOAL WEEK 1 PT Short Term Goal 1 (Week 1): pt will perform bed mobility with min A consistantly PT Short Term Goal 2 (Week 1): pt will transfer sit<>stand with LRAD and CGA consistantly PT Short Term Goal 3  (Week 1): pt will ambulate 49ft with LRAD and min A  Recommendations for other services: None   Skilled Therapeutic Intervention Evaluation completed (see details above and below) with education on PT POC and goals and individual treatment initiated with focus on functional mobility/transfers global strengthening and endurance and dynamic standing balance/coordination. Received pt semi-reclined in bed, pt educated on PT evaluation, CIR policies, and therapy schedule and agreeable. Pt denied any pain initially, but reported increased RLE pain with mobility. Pt pleasant but hyperverbal - easily redirected.   Provided pt with 18x18 manual WC with elevating legrests, RW, gown, and scrub clothing. Increased time spent assisting pt with calling wife, [name], to bring clothing. Donned non-skid socks and pt transferred semi-reclined<>sitting R EOB with HOB elevated and use of bedrails with mod A for trunk control and LLE management. Pt required increased time to scoot to EOB. Stood from EOB without AD and mod A and performed stand<>pivot bed<>WC without AD and mod A. Assessed BP: 130/59 - pt asymptomatic. Concluded session with pt sitting in WC, needs within reach, and seatbelt alarm on. Safety plan updated.   Mobility Bed Mobility Bed Mobility: Rolling Right;Supine to Sit Rolling Right: Moderate Assistance - Patient 50-74% Supine to Sit: Moderate Assistance - Patient 50-74% Transfers Transfers: Sit to Stand;Stand to Sit;Stand Pivot Transfers Sit to Stand: Moderate Assistance - Patient 50-74% Stand to Sit: Minimal Assistance - Patient > 75% Stand Pivot Transfers: Moderate Assistance - Patient 50 - 74% Transfer (Assistive device): None Locomotion  Gait Ambulation: No Gait Gait: No Stairs / Additional Locomotion Stairs: No Wheelchair Mobility Wheelchair Mobility: No   Discharge Criteria: Patient will be discharged from PT if patient refuses treatment 3 consecutive times without medical reason, if  treatment goals not met, if there is a change in medical status, if patient makes no progress towards goals or if patient is discharged from hospital.  The above assessment, treatment plan, treatment alternatives and goals were discussed and mutually agreed upon: by patient  Huntley Dec PT, DPT 01/05/2023, 12:10 PM

## 2023-01-05 NOTE — Progress Notes (Signed)
PROGRESS NOTE   Subjective/Complaints: No acute events overnight.  Per nursing note wife has reported he had low blood pressures previously when out of bed.  Therapy reports he has not been having any dizziness or other symptoms when standing.  Frustrated about being in the hospital for so long.  Patient with no new concerns this morning.  ROS: Patient denies fever, chills, sore throat, blurred vision, dizziness, nausea, vomiting, diarrhea, cough, shortness of breath or chest pain, joint or back/neck pain, headache, or mood change. + Generalized weakness  Objective:   No results found. Recent Labs    01/04/23 0505  WBC 10.2  HGB 11.6*  HCT 34.7*  PLT 222   Recent Labs    01/03/23 0432 01/04/23 0505  NA 137 140  K 3.7 4.1  CL 97* 98  CO2 25 23  GLUCOSE 80 66*  BUN 43* 69*  CREATININE 3.82* 5.71*  CALCIUM 8.3* 8.4*    Intake/Output Summary (Last 24 hours) at 01/05/2023 1340 Last data filed at 01/05/2023 0724 Gross per 24 hour  Intake 480 ml  Output --  Net 480 ml        Physical Exam: Vital Signs Blood pressure (!) 154/61, pulse (!) 58, temperature (!) 97.5 F (36.4 C), temperature source Oral, resp. rate 18, weight 88.7 kg, SpO2 100%.  General: No acute distress, sitting in wheelchair Mood and affect are appropriate Heart: RRR, no MRG Lungs: CTAB Abdomen: Positive bowel sounds, soft nontender to palpation, nondistended Extremities: No clubbing, cyanosis, or edema Skin: No evidence of breakdown, severe stasis dermatitis changes will order eucerin  Neurologic: Alert and awake, follows commands, cranial nerves II through XII grossly intact, motor strength is 4/5 in bilateral deltoid, bicep, tricep, grip, hip flexor, knee extensors, ankle dorsiflexor and plantar flexor Sensory exam normal sensation to light touch and proprioception in bilateral upper and lower extremities Musculoskeletal: Full range of  motion in all 4 extremities. No joint swelling  Assessment/Plan: 1. Functional deficits which require 3+ hours per day of interdisciplinary therapy in a comprehensive inpatient rehab setting. Physiatrist is providing close team supervision and 24 hour management of active medical problems listed below. Physiatrist and rehab team continue to assess barriers to discharge/monitor patient progress toward functional and medical goals  Care Tool:  Bathing    Body parts bathed by patient: Right arm, Left arm, Chest, Abdomen, Face, Right upper leg, Right lower leg   Body parts bathed by helper: Left upper leg, Left lower leg, Buttocks     Bathing assist Assist Level: Maximal Assistance - Patient 24 - 49%     Upper Body Dressing/Undressing Upper body dressing   What is the patient wearing?: Hospital gown only    Upper body assist Assist Level: Minimal Assistance - Patient > 75%    Lower Body Dressing/Undressing Lower body dressing      What is the patient wearing?: Hospital gown only, Incontinence brief     Lower body assist Assist for lower body dressing: Maximal Assistance - Patient 25 - 49%     Toileting Toileting    Toileting assist Assist for toileting: Maximal Assistance - Patient 25 - 49%     Transfers  Chair/bed transfer  Transfers assist     Chair/bed transfer assist level: Moderate Assistance - Patient 50 - 74%     Locomotion Ambulation   Ambulation assist   Ambulation activity did not occur: Safety/medical concerns (fatigue, pain)          Walk 10 feet activity   Assist  Walk 10 feet activity did not occur: Safety/medical concerns (fatigue, pain)        Walk 50 feet activity   Assist Walk 50 feet with 2 turns activity did not occur: Safety/medical concerns (fatigue, pain)         Walk 150 feet activity   Assist Walk 150 feet activity did not occur: Safety/medical concerns (fatigue, pain)         Walk 10 feet on uneven surface   activity   Assist Walk 10 feet on uneven surfaces activity did not occur: Safety/medical concerns (fatigue, pain)         Wheelchair     Assist Is the patient using a wheelchair?: Yes Type of Wheelchair: Manual Wheelchair activity did not occur: Safety/medical concerns (pain, fatigue)         Wheelchair 50 feet with 2 turns activity    Assist    Wheelchair 50 feet with 2 turns activity did not occur: Safety/medical concerns (pain, fatigue)       Wheelchair 150 feet activity     Assist  Wheelchair 150 feet activity did not occur: Safety/medical concerns (pain, fatigue)       Blood pressure (!) 154/61, pulse (!) 58, temperature (!) 97.5 F (36.4 C), temperature source Oral, resp. rate 18, weight 88.7 kg, SpO2 100%.  Medical Problem List and Plan: 1. Functional deficits secondary to CHF             -patient may not  shower, HD cath             -ELOS/Goals: 18-21d, pt  goal is home by Birthday on 10/29  -Continue CIR, team conference tomorrow   2.  Antithrombotics: -DVT/anticoagulation:  Pharmaceutical: Eliquis             -antiplatelet therapy:    3. Pain Management: Tylenol as needed   4. Mood/Behavior/Sleep: LCSW to evaluate and provide emotional support             -antipsychotic agents: n/a   5. Neuropsych/cognition: This patient is  capable of making decisions on his  own behalf.   6. Skin/Wound Care: Routine skin care checks, Eucerin cream for stasis dermatitis   7. Fluids/Electrolytes/Nutrition: Strict Is and Os and follow-up chemistries per nephrology             -renal diet with FR             -continue vitamin C, D3, Renavit   8: Hypertension: monitor TID and prn             -continue doxazosin 1 mg BID             -continue Lasix 80 mg daily             -continue hydralazine 25 mg TID  -BP has been somewhat labile, continue to monitor trend.  Monitor for symptoms of orthostatic hypotension, particularly after hemodialysis.       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 hypoglycemia  CBG (last 3)  Recent Labs    01/04/23 2115 01/05/23 0704 01/05/23 1153  GLUCAP 145* 90 223*       13: ESRD: HD on T/T/S via right internal jugular TDC   14: Chronic diastolic CHF: daily weight (dry weight ~220#)  EF is normal  -(meds as in #8) -fluid balance>>HD   15: Atrial fibrillation: on Eliquis - bradycardia , not taking BBs monitor rate  -Avoid negative chronotropic medications   16: COPD/acute hypoxic respiratory failure due to volume overload:  -continue Breo Ellipta             -Wean supplemental O2 as able   17: CAD: on statin, meds as in #8   18: Obesity: BMI = 30.17  -Dietary education   19: Anemia of chronic inflammation, cardiorenal disease ESRD: -continue Epogen 4000 units every TTS on hemodialysis days as per nephrology     20: Bilateral lower extremity lymphedema, chronic: -continue Lotrisone cream twice daily      LOS: 1 days A FACE TO FACE EVALUATION WAS PERFORMED  Fanny Dance 01/05/2023, 1:40 PM

## 2023-01-05 NOTE — Progress Notes (Signed)
Inpatient Rehabilitation Admission Medication Review by a Pharmacist  A complete drug regimen review was completed for this patient to identify any potential clinically significant medication issues.  High Risk Drug Classes Is patient taking? Indication by Medication  Antipsychotic No   Anticoagulant Yes Apixaban- PAF Heparin- iHD  Antibiotic No   Opioid No   Antiplatelet No   Hypoglycemics/insulin Yes Insulin- T2DM  Vasoactive Medication Yes Lasix, apresoline, imdur- HTN  Chemotherapy No   Other Yes Melatonin- sleep Protonix- GERD Crestor- HLD Lovaza- hypertriglyceridemia Hydroxyzine- itching     Type of Medication Issue Identified Description of Issue Recommendation(s)  Drug Interaction(s) (clinically significant)     Duplicate Therapy     Allergy     No Medication Administration End Date     Incorrect Dose     Additional Drug Therapy Needed     Significant med changes from prior encounter (inform family/care partners about these prior to discharge).    Other       Clinically significant medication issues were identified that warrant physician communication and completion of prescribed/recommended actions by midnight of the next day:  No    Time spent performing this drug regimen review (minutes):  30   Annastyn Silvey BS, PharmD, BCPS Clinical Pharmacist 01/05/2023 7:30 AM  Contact: [phone] after 3 PM  "Be curious, not judgmental..." -Debbora Dus

## 2023-01-05 NOTE — Progress Notes (Signed)
Advised of pt's admission to CIR from renal staff. Pt was clipped to [facility] on TTS at d/c from rehab. Will need to confirm chair time closer to pt's d/c date. Info provided to CSW. Will assist as needed.   Olivia Canter Renal Navigator [phone]

## 2023-01-05 NOTE — Progress Notes (Signed)
Inpatient Rehabilitation Care Coordinator Assessment and Plan Patient Details  Name: Kyle Roberson MRN: [id number] Date of Birth: 12/02/1940  Today's Date: 01/05/2023  Hospital Problems: Principal Problem:   Debility  Past Medical History:  Past Medical History:  Diagnosis Date   AKI (acute kidney injury) (HCC) 07/06/2018   CAD (coronary artery disease)    a. Remote PCI/stenting to LAD w PTCA Diagnoal. RCA 60%; b.  2005/2008 Cardiolites w/ reportedly mild ischemia in Diag territory-->Med rx.   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 04/2019 Echo: EF 55-60%, no rwma, nl RV fxn, RVSP 55.25mmHg. Mod dil LA. Mild-mod MR. Mod dil PA.   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 04/2019 Echo: RVSP 55.38mmHg.   Permanent atrial fibrillation (HCC)    a. CHA2DS2VASc = 5-->dose adjusted eliquis (age/creat).   Subdural hematoma (HCC)    a. 01/2021 in setting of fall s/p L frontotemporal craniotomy.   Past Surgical History:  Past Surgical History:  Procedure Laterality Date   BLEPHAROPLASTY     CARDIAC CATHETERIZATION     CORONARY STENT INTERVENTION     CRANIOTOMY Left 02/04/2021   Procedure: CRANIOTOMY FOR LEFT SUBDURAL  HEMATOMA EVACUATION;  Surgeon: Lucy Chris, MD;  Location: [facility] ORS;  Service: Neurosurgery;  Laterality: Left;   CYSTOSCOPY     DIALYSIS/PERMA CATHETER INSERTION N/A 12/14/2022   Procedure: DIALYSIS/PERMA CATHETER INSERTION;  Surgeon: Annice Needy, MD;  Location: [facility] INVASIVE CV LAB;  Service: Cardiovascular;  Laterality: N/A;   DIALYSIS/PERMA CATHETER REPAIR N/A 12/16/2022   Procedure: DIALYSIS/PERMA CATHETER REPAIR;  Surgeon: Annice Needy, MD;  Location: [facility] INVASIVE CV LAB;   Service: Cardiovascular;  Laterality: N/A;   HERNIA REPAIR     RIGHT HEART CATH N/A 12/02/2022   Procedure: RIGHT HEART CATH;  Surgeon: Laurey Morale, MD;  Location: [facility] INVASIVE CV LAB;  Service: Cardiovascular;  Laterality: N/A;   Social History:  reports that he has quit smoking. He has never used smokeless tobacco. He reports that he does not currently use alcohol after a past usage of about 2.0 standard drinks of alcohol per week. He reports that he does not currently use drugs.  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 Basically stays at home and goes out when has appointments.  Social History Preferred language: English Religion:  Cultural Background: No issues Education: HS Health Literacy - How often do you need to have someone help you when you read instructions, pamphlets, or other written material from your doctor or pharmacy?: Never Writes: Yes Employment Status: Retired Marine scientist Issues: No issues Guardian/Conservator: none-according to MD pt is capable of making his own decisions. Will include [name] in any decisions while here   Abuse/Neglect Abuse/Neglect Assessment Can Be Completed: Yes Physical Abuse: Denies Verbal Abuse: Denies Sexual Abuse: Denies Exploitation of patient/patient's resources: Denies Self-Neglect: Denies  Patient response to: Social Isolation - How often do you feel lonely or isolated from those around you?: Never  Emotional Status Pt's affect, behavior and adjustment status: Pt is  motivated to get stronger to get back home he likes being at home and not one to go out much. He hopes to feel better and sleep better. Recent Psychosocial Issues: other health issues-was on CIR in 2022 after SDH. Psychiatric History: No history feels he manages well and is not one to express his feelings to strangers. May place pt on neuro-psych to be seen while here Substance Abuse History: No issues  Patient / Family Perceptions, Expectations & Goals Pt/Family understanding of illness & functional limitations: Pt can explain his renal issues and need for HD now. He does talk with the MD and feels understands his plan moving forward. He feels his questions are answered. 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, father, retiree, farmer, etc Anticipated changes in roles/activities/participation: resume Pt/family expectations/goals: Pt states: " I hope to get better and get home soon."  [employer]: None Premorbid Home Care/DME Agencies: Other (Comment) (quad cane, rollator, tub seat, lift chair) Transportation available at discharge: wife Is the patient able to respond to transportation needs?: Yes In the past 12 months, has lack of transportation kept you from medical appointments or from getting medications?: No In the past 12 months, has lack of transportation kept you from meetings, work, or from getting things needed for daily living?: No Resource referrals recommended: Neuropsychology  Discharge Planning Living Arrangements: Spouse/significant other Support Systems: Spouse/significant other, Children, Friends/neighbors Type of Residence: Private residence Insurance Resources: Media planner (specify) Administrator Medicare) Financial Resources: Social Security, Family Support Financial Screen Referred: No Living Expenses: Own Money Management: Patient, Spouse Does the patient have any problems obtaining your medications?: No Home Management:  wife Patient/Family Preliminary Plans: Return home with wife who does work outside the home-runs a Aeronautical engineer business and helps with the cattle farm. Pt has been here before and knows the rountine here. He hopes to only be here a short time and is hope soon. Care Coordinator Anticipated Follow Up Needs: HH/OP  Clinical Impression Pleasant gentleman who has been here on CIR two years ago. He did well and recovered and was using a cane at home prior to admission. Wife is supportive and will be assisting also. Will await team's evaluations and work on discharge needs.  Lucy Chris 01/05/2023, 9:18 AM

## 2023-01-05 NOTE — Progress Notes (Signed)
Inpatient Rehabilitation Center Individual Statement of Services  Patient Name:  Kyle Roberson  Date:  01/05/2023  Welcome to the Inpatient Rehabilitation Center.  Our goal is to provide you with an individualized program based on your diagnosis and situation, designed to meet your specific needs.  With this comprehensive rehabilitation program, you will be expected to participate in at least 3 hours of rehabilitation therapies Monday-Friday, with modified therapy programming on the weekends.  Your rehabilitation program will include the following services:  Physical Therapy (PT), Occupational Therapy (OT), 24 hour per day rehabilitation nursing, Therapeutic Recreaction (TR), Neuropsychology, Care Coordinator, Rehabilitation Medicine, Nutrition Services, and Pharmacy Services  Weekly team conferences will be held on Wednesday to discuss your progress.  Your Inpatient Rehabilitation Care Coordinator will talk with you frequently to get your input and to update you on team discussions.  Team conferences with you and your family in attendance may also be held.  Expected length of stay: 2 weeks  Overall anticipated outcome: supervision-min assist   Depending on your progress and recovery, your program may change. Your Inpatient Rehabilitation Care Coordinator will coordinate services and will keep you informed of any changes. Your Inpatient Rehabilitation Care Coordinator's name and contact numbers are listed  below.  The following services may also be recommended but are not provided by the Inpatient Rehabilitation Center:   Home Health Rehabiltiation Services Outpatient Rehabilitation Services    Arrangements will be made to provide these services after discharge if needed.  Arrangements include referral to agencies that provide these services.  Your insurance has been verified to be:  [employer] Your primary doctor is:  laura Berglund  Pertinent information will be shared with your  doctor and your insurance company.  Inpatient Rehabilitation Care Coordinator:  Dossie Der, [place] [phone] or Luna Glasgow  Information discussed with and copy given to patient by: Lucy Chris, 01/05/2023, 9:20 AM

## 2023-01-06 LAB — HEPATITIS B SURFACE ANTIGEN: Hepatitis B Surface Ag: NONREACTIVE

## 2023-01-06 LAB — GLUCOSE, CAPILLARY
Glucose-Capillary: 100 mg/dL — ABNORMAL HIGH (ref 70–99)
Glucose-Capillary: 207 mg/dL — ABNORMAL HIGH (ref 70–99)
Glucose-Capillary: 116 mg/dL — ABNORMAL HIGH (ref 70–99)
Glucose-Capillary: 208 mg/dL — ABNORMAL HIGH (ref 70–99)

## 2023-01-06 MED ORDER — CHLORHEXIDINE GLUCONATE CLOTH 2 % EX PADS: 6.0000 | MEDICATED_PAD | Freq: Every day | CUTANEOUS | Status: DC

## 2023-01-06 NOTE — Progress Notes (Addendum)
PROGRESS NOTE   Subjective/Complaints: No new medical concerns this AM. Reports he had issues with meals being late yesterday and dialysis was completed late last night.   ROS: Patient denies fever, chills, sore throat, blurred vision, dizziness, nausea, vomiting, diarrhea, cough, shortness of breath or chest pain, dizziness, joint or back/neck pain, headache, or mood change. + Generalized weakness  Objective:   No results found. Recent Labs    01/04/23 0505  WBC 10.2  HGB 11.6*  HCT 34.7*  PLT 222   Recent Labs    01/04/23 0505  NA 140  K 4.1  CL 98  CO2 23  GLUCOSE 66*  BUN 69*  CREATININE 5.71*  CALCIUM 8.4*    Intake/Output Summary (Last 24 hours) at 01/06/2023 1031 Last data filed at 01/05/2023 2234 Gross per 24 hour  Intake 480 ml  Output 1500 ml  Net -1020 ml        Physical Exam: Vital Signs Blood pressure 135/63, pulse (!) 58, temperature (!) 97.5 F (36.4 C), resp. rate 18, weight 88.7 kg, SpO2 96%. Body mass index is 29.73 kg/m.  General: No acute distress, sitting in wheelchair working with therapy Mood and affect are appropriate Heart: RRR, no MRG Lungs: CTAB Abdomen: Positive bowel sounds, soft nontender to palpation, nondistended Extremities: No clubbing, cyanosis, or edema Skin: No evidence of breakdown, severe stasis dermatitis changes  TDC R chest wall  Neurologic: Alert and awake, follows commands, cranial nerves II through XII grossly intact, motor strength is 4/5 in bilateral deltoid, bicep, tricep, grip, hip flexor, knee extensors, ankle dorsiflexor and plantar flexor Sensory exam normal sensation to light touch and proprioception in bilateral upper and lower extremities No hypertonia noted Musculoskeletal: Full range of motion in all 4 extremities. No joint swelling  Assessment/Plan: 1. Functional deficits which require 3+ hours per day of interdisciplinary therapy in a  comprehensive inpatient rehab setting. Physiatrist is providing close team supervision and 24 hour management of active medical problems listed below. Physiatrist and rehab team continue to assess barriers to discharge/monitor patient progress toward functional and medical goals  Care Tool:  Bathing    Body parts bathed by patient: Right arm, Left arm, Chest, Abdomen, Face, Right upper leg, Right lower leg   Body parts bathed by helper: Left upper leg, Left lower leg, Buttocks     Bathing assist Assist Level: Maximal Assistance - Patient 24 - 49%     Upper Body Dressing/Undressing Upper body dressing   What is the patient wearing?: Hospital gown only    Upper body assist Assist Level: Minimal Assistance - Patient > 75%    Lower Body Dressing/Undressing Lower body dressing      What is the patient wearing?: Hospital gown only, Incontinence brief     Lower body assist Assist for lower body dressing: Maximal Assistance - Patient 25 - 49%     Toileting Toileting    Toileting assist Assist for toileting: Maximal Assistance - Patient 25 - 49%     Transfers Chair/bed transfer  Transfers assist     Chair/bed transfer assist level: Moderate Assistance - Patient 50 - 74%     Locomotion Ambulation   Ambulation  assist   Ambulation activity did not occur: Safety/medical concerns (fatigue, pain)          Walk 10 feet activity   Assist  Walk 10 feet activity did not occur: Safety/medical concerns (fatigue, pain)        Walk 50 feet activity   Assist Walk 50 feet with 2 turns activity did not occur: Safety/medical concerns (fatigue, pain)         Walk 150 feet activity   Assist Walk 150 feet activity did not occur: Safety/medical concerns (fatigue, pain)         Walk 10 feet on uneven surface  activity   Assist Walk 10 feet on uneven surfaces activity did not occur: Safety/medical concerns (fatigue, pain)          Wheelchair     Assist Is the patient using a wheelchair?: Yes Type of Wheelchair: Manual Wheelchair activity did not occur: Safety/medical concerns (pain, fatigue)         Wheelchair 50 feet with 2 turns activity    Assist    Wheelchair 50 feet with 2 turns activity did not occur: Safety/medical concerns (pain, fatigue)       Wheelchair 150 feet activity     Assist  Wheelchair 150 feet activity did not occur: Safety/medical concerns (pain, fatigue)       Blood pressure 135/63, pulse (!) 58, temperature (!) 97.5 F (36.4 C), resp. rate 18, weight 88.7 kg, SpO2 96%.  Medical Problem List and Plan: 1. Functional deficits secondary to CHF             -patient may not  shower, HD cath             -ELOS/Goals: 18-21d, pt  goal is home by Birthday on 10/29  -Continue CIR ` -Team conference today please see physician documentation under team conference tab, met with team  to discuss problems,progress, and goals. Formulized individual treatment plan based on medical history, underlying problem and comorbidities.     2.  Antithrombotics: -DVT/anticoagulation:  Pharmaceutical: Eliquis             -antiplatelet therapy:    3. Pain Management: Tylenol as needed   4. Mood/Behavior/Sleep: LCSW to evaluate and provide emotional support             -antipsychotic agents: n/a   5. Neuropsych/cognition: This patient is  capable of making decisions on his  own behalf.   6. Skin/Wound Care: Routine skin care checks, Eucerin cream for stasis dermatitis   7. Fluids/Electrolytes/Nutrition: Strict Is and Os and follow-up chemistries per nephrology             -renal diet with FR             -continue vitamin C, D3, Renavit   8: Hypertension: monitor TID and prn             -continue doxazosin 1 mg BID             -continue Lasix 80 mg daily             -continue hydralazine 25 mg TID  -BP has been somewhat labile, continue to monitor trend.  Monitor for symptoms of  orthostatic hypotension, particularly after hemodialysis.  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 hypoglycemia  -10/16 Glucose better controlled, continue to monitor trend  CBG (last 3)  Recent Labs    01/05/23 1619 01/05/23 2324 01/06/23 0545  GLUCAP 121* 120* 100*       13: ESRD: HD on T/T/S via right internal jugular TDC  -Reviewed nephrology note   14: Chronic diastolic CHF: daily weight (dry weight ~220#)  EF is normal  -(meds as in #8) -fluid balance>>HD  Filed Weights   01/02/23 0857 01/05/23 0500  Weight: 87.6 kg 88.7 kg      15: Atrial fibrillation: on Eliquis - bradycardia , not taking BBs monitor rate  -Avoid negative chronotropic medications  Hr appears improved in upper 50s low 60s   16: 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

## 2023-01-06 NOTE — Progress Notes (Signed)
Physical Therapy Session Note  Patient Details  Name: Kyle Roberson MRN: [id number] Date of Birth: 07/30/1940  Today's Date: 01/06/2023 PT Individual Time: 0840-0952 PT Individual Time Calculation (min): 72 min   Short Term Goals: Week 1:  PT Short Term Goal 1 (Week 1): pt will perform bed mobility with min A consistantly PT Short Term Goal 2 (Week 1): pt will transfer sit<>stand with LRAD and CGA consistantly PT Short Term Goal 3 (Week 1): pt will ambulate 52ft with LRAD and min A  Skilled Therapeutic Interventions/Progress Updates:      Pt seated in WC upon arrival. Pt agreeable to therapy. Pt refusing to go to gym 2/2 fatigue from dialysis but agreeable to therapy in the room.  Pt irriatated, reports he didn't have breakfast until 4 pm yesterday and dialysis until 11 pm, pt reports he had his breakfast this morning. Dialysis PA present for rounds, discussed pt concerns.   Sit to stand throughout session with RW and min A from WC and mod A for level bed, verbal cues provided for technique and upright posture.   Standing marching 2x5 B with CGA/min A with 1 episode of LOB,  verbal cues provided for technique and increased LE clearance B, verbal cues provided for breathing in through nose and out through mouth for SOB versus mouth breathing, pt demos no change in breathing despite cues and encouragement.   RA O2 99, HR 82, seated BP 117/65 HR 60. Standing BP 118/58 HR 79. Pt asymptomatic. MD present for daily rounds, verbal order for ted hose, therapist attempted to donn ted hose with total A, however pt refusing 2/2 pain and inability to tolerate. MD notified.   Pt requesting to use the bathroom. Pt ambulated 2x~30 feet to and from bathroom with RW and min-light mod A espeically over theshold, verbal cues provided for safety with RW and reciprocal gait. Stand to sit on BSC with mod A, verbal cues provided for use of B UE support for controlled descent. Pt continent of bowel and  bladder.   Pt donned and doffed pants and performed pericare with total A while standing with RW and CGA, pt requiried seated rest break after pericare and donning and doffing brief 2/2 fatigue.   Pt performed the following seated therex for B LE stregnthening, and imrpoved activity tolerance:   1x10 B LAQ  1x10 seated alternating marching  1x10 B ankle pumps   Pt ambulated 2x15 feet with RW and CGA/min A, verbal cues provided for standing within frame of RW, pt required seated rest break on bed 2/2 fatigue.   Pt seated in WC at end of session with seatbelt alarm on and nurse in room.    Therapy Documentation Precautions:  Precautions Precautions: Fall Restrictions Weight Bearing Restrictions: No  Therapy/Group: Individual Therapy  [facility] Ambrose Finland, [place], DPT  01/06/2023, 8:33 AM

## 2023-01-06 NOTE — Progress Notes (Addendum)
Recreational Therapy Session Note  Patient Details  Name: Kyle Roberson MRN: [id number] Date of Birth: 1940-09-05 Today's Date: 01/06/2023  Pain: no c/o Skilled Therapeutic Interventions/Progress Updates: Pt participated in animal assisted activity seated w/c level with supervision.  Pt easily engaged with pet partner team and was appreciative of this visit.   Spyridon Hornstein 01/06/2023, 1:35 PM

## 2023-01-06 NOTE — Progress Notes (Signed)
[facility] Progress Note   Subjective:   Seen in room - PT ongoing. There was some issues with HD yesterday - initially refused due to wanting to finish his meal, able to come later in the evening. He is not happy about being dialyzed late, wants to eat his meals on time. Discussed our goal is to provide timely HD but we cannot predict any emergencies which may impact the schedule. He reports that will refuse night dialysis in the future. We discussed that is his right to refuse, but if that is the case, then we will not be able to offer a make-up the following day unless his labs or symptoms are emergent, he understands this. No CP/dyspnea today.  Objective Vitals:   01/05/23 2231 01/05/23 2234 01/05/23 2324 01/06/23 0544  BP: 121/63 116/64 135/60 135/63  Pulse: (!) 58 63 (!) 56 (!) 58  Resp: (!) 33 13 16 18   Temp:  98.4 F (36.9 C)  (!) 97.5 F (36.4 C)  TempSrc:  Axillary    SpO2: 97% 97% 98% 96%  Weight:       Physical Exam General: Elderly man, NAD. Room air. Lower eyelid ectropion Heart: RRR; no murmur Lungs: CTAB; no rales Abdomen: distended, non-tender Extremities:Hypertrophic, wrinkled skin changes to BLE (s/p edema/lymphedema improvement) Dialysis Access: [facility] in R chest  Additional Objective Labs: Basic Metabolic Panel: Recent Labs  Lab 01/02/23 0523 01/03/23 0432 01/04/23 0505  NA 136 137 140  K 3.8 3.7 4.1  CL 98 97* 98  CO2 23 25 23   GLUCOSE 113* 80 66*  BUN 65* 43* 69*  CREATININE 5.32* 3.82* 5.71*  CALCIUM 7.9* 8.3* 8.4*  PHOS 6.1* 4.8* 7.2*   Liver Function Tests: Recent Labs  Lab 01/02/23 0523 01/03/23 0432 01/04/23 0505  ALBUMIN 3.1* 3.3* 3.1*   CBC: Recent Labs  Lab 12/31/22 0604 01/01/23 0620 01/02/23 0522 01/04/23 0505  WBC 9.6 9.3 9.7 10.2  NEUTROABS 7.2 7.0 7.1 7.3  HGB 11.4* 11.3* 10.9* 11.6*  HCT 34.3* 34.9* 32.7* 34.7*  MCV 90.0 91.6 89.8 89.4  PLT 216 205 199 222   CBG: Recent Labs  Lab 01/05/23 0704  01/05/23 1153 01/05/23 1619 01/05/23 2324 01/06/23 0545  GLUCAP 90 223* 121* 120* 100*   Medications:   (feeding supplement) PROSource Plus  30 mL Oral BID BM   apixaban  2.5 mg Oral BID   vitamin C  500 mg Oral BID   Chlorhexidine Gluconate Cloth  6 each Topical Q12H   cholecalciferol  2,000 Units Oral Daily   clotrimazole-betamethasone   Topical BID   docusate sodium  100 mg Oral BID   fluticasone furoate-vilanterol  1 puff Inhalation Daily   furosemide  80 mg Oral Q M,W,F   hydrALAZINE  25 mg Oral TID   hydrocerin   Topical TID   insulin aspart  0-15 Units Subcutaneous TID AC & HS   insulin aspart  3 Units Subcutaneous TID WC   insulin glargine-yfgn  18 Units Subcutaneous Daily   isosorbide mononitrate  60 mg Oral Daily   melatonin  5 mg Oral QHS   multivitamin  1 tablet Oral QHS   neomycin-polymyxin b-dexamethasone  1 Application Right Eye TID   omega-3 acid ethyl esters  1 g Oral Daily   pantoprazole  40 mg Oral Daily   rosuvastatin  10 mg Oral Once per day on Monday Wednesday Friday   sevelamer carbonate  800 mg Oral TID WC   Background: Kyle Roberson  is a 82 y.o. male with (newly declared during [facility] admit) ESRD, T2DM, HTN, CAD, HFpEF, A-fib (on Eliquis), and Hx SDH (01/2021) who was transferred from [facility] to [facility] for ongoing rehab.   Dialysis Orders: Has been accepted to [facility] on TTS per notes (will start after CIR d/c) Establishing orders.    Assessment/Plan:  Debility: Admitted to CIR for ongoing rehab.  ESRD:  Continue HD per TTS schedule - next HD 10/17. Using [facility]. Will discuss with rehab team to see if would be an option to have permanent access placed while here - may be hindrance to his therapy sessions. Hypertension/volume: BP up, abdomen appears distended - keep challenging weight down to establish dry weight. Anemia of ESRD: Hgb > 11.5 at this time, no ESA needs. Will check iron studies. Metabolic bone disease: Ca ok, Phos high. Does not appear  that he was getting a binder - Renvela 1/meals started. Check PTH.  Nutrition: Alb low, continue renal diet + prot supps. T2DM HFpEF  [name], PA-C 01/06/2023, 9:50 AM  [employer]

## 2023-01-06 NOTE — Progress Notes (Signed)
Occupational Therapy Session Note  Patient Details  Name: Kyle Roberson MRN: [id number] Date of Birth: [birth year]  Today's Date: 01/06/2023 OT Individual Time: 4098-1191 OT Individual Time Calculation (min): 45 min  and Today's Date: 01/06/2023 OT Missed Time: 15 Minutes Missed Time Reason:  (Delay in care and IV team)  Today's Date: 01/06/2023 OT Individual Time: 1420-1530 OT Individual Time Calculation (min): 70 min   Short Term Goals: Week 1:  OT Short Term Goal 1 (Week 1): Pt will stand up to 2 min for grooming sink side level OT Short Term Goal 2 (Week 1): Pt will complete LB self care with AE as needed with mod A OT Short Term Goal 3 (Week 1): Pt will complete toilet transfer with CGA using LRAD  Skilled Therapeutic Interventions/Progress Updates:   Session 1: Pt received sitting in [facility] for skilled OT session with focus on activity tolerance and dynamic standing tolerance. Pt agreeable to interventions, demonstrating overall pleasant mood. Pt with no reports of pain. OT offering intermediate rest breaks and positioning suggestions throughout session to address pain/fatigue and maximize participation/safety in session.   Pt defers all ADLs this AM session, declining to leave room due to generalized fatigue. Session transitioned to room-level. Pt performs all STS transfers with close supervision + RW (attempted x1 STS without RW, but unsuccessful due to fatigue). Pt requires Min-Mod A + cuing for slow descent onto WC. Pt instructed in standing bean bag toss game with focus placed on dynamic standing balance and activity tolerance. Pt participates in x3 rounds of activity, each lasting ~3 mins with CGA + unilateral support on RW initially, transitioning to no UE support on RW + CGA. Pt with x1 lateral LOB, recovering with Mod A.   Pt remained sitting in [facility] with all immediate needs met at end of session. Pt missing ~15 mins of skilled intervention due to delay in care and IV team. Pt  continues to be appropriate for skilled OT intervention to promote further functional independence.   Session 2: Pt received sitting in [facility] for skilled OT session with focus on toileting, functional transfers/ambulation, and general conditioning. Pt agreeable to interventions, despite demonstrating overall irritable mood. Pt  OT offering intermediate rest breaks and positioning suggestions throughout session to address pain/fatigue and maximize participation/safety in session.   Pt performs initial STS with x2 attempts, requiring heavy Mod A + RW to reach stance, ambulating into/out of bathroom with CGA + RW/cuing for safe RW management. Pt requires A for brief management, encouragement provided for independent for pericare, ultimately Min A for thoroughness, continent of BM. Pt able to thread and hike clean brief with Min A + RW. Pt performs stand-step transfer from WC>EOB with Min A for STS and subsequent CGA + RW. Pt requires A for BLE management into supine.   At bed-level, pt instructed in UE strengthening exercises, details below: Bicep curls Punches (no weight) Wrist curls Lateral raise holds (15 secs)  Pt performs 2x20 reps of each exercise, requiring multimodal cuing for correct form. Pt with reports of pain in R-shoulder from length of hospital stay and use of arm for nursing care (BP, IV, etc.)   Pt remained resting in bed with all immediate needs met at end of session. Pt continues to be appropriate for skilled OT intervention to promote further functional independence.   Therapy Documentation Precautions:  Precautions Precautions: Fall Restrictions Weight Bearing Restrictions: No   Therapy/Group: Individual Therapy  Lou Cal, OTR/L, MSOT  01/06/2023, 10:26 AM

## 2023-01-06 NOTE — Patient Care Conference (Signed)
Inpatient RehabilitationTeam Conference and Plan of Care Update Date: 01/06/2023   Time: 12:18 PM    Patient Name: Kyle Roberson      Medical Record Number: [id number]  Date of Birth: 04-Dec-1940 Sex: Male         Room/Bed: 4W15C/4W15C-01 Payor Info: Payor: [employer] MEDICARE / Plan: [employer] MEDICARE HMO/PPO / Product Type: *No Product type* /    Admit Date/Time:  01/04/2023  3:02 PM  Primary Diagnosis:  Debility  Hospital Problems: Principal Problem:   Debility    Expected Discharge Date: Expected Discharge Date: 01/18/23  Team Members Present: Physician leading conference: Dr. Fanny Dance Social Worker Present: Dossie Der, LCSW Nurse Present: Chana Bode, RN PT Present: Ambrose Finland, PT OT Present: Lou Cal, OT SLP Present: Feliberto Gottron, SLP PPS Coordinator present : Fae Pippin, SLP     Current Status/Progress Goal Weekly Team Focus  Bowel/Bladder   Pt is continent of b/b.   Remain continent   Assist with toileting qshift and prn    Swallow/Nutrition/ Hydration               ADL's   Min A UB ADLs & Max A LB ADLs   Min A LB, S UB and functional transfers   Higher level coginition and general conditioning    Mobility   bed mobility mod A, transfers without AD mod A, short distance gait with RW and min-mod A   supervision/mod I  barriers: fatigue, global weakness/deconditioning, intermittent pain, non compliance, irritation    Communication                Safety/Cognition/ Behavioral Observations               Pain   No c/o pain   Remain pain free   Assess pain qshift and prn    Skin   Bilateral lower extremities lymphodema, skin intact   Maintain skin integrity  Assess qshift and prn      Discharge Planning:  Home with wife who works outside of the home, will need to see if plans to hire assist while she is gone. Pt feels he can manage at home   Team Discussion: Patient admitted with debility post complex  medical issues; New HD for ESRD and labile blood pressure; limited by fatigue and cognitive issues, not receptive to cues for safety.   Patient on target to meet rehab goals: yes, currently needs mod assist for transfers without an assistive device. Needs min assist for upper body care and max assist for lower body care. Goals for discharge set for supervision - mod I overall.  *See Care Plan and progress notes for long and short-term goals.   Revisions to Treatment Plan:  N/a   Teaching Needs: Safety, medications, transfers, toileting, etc.   Current Barriers to Discharge: Decreased caregiver support  Possible Resolutions to Barriers: Family education HH follow up services DME: TTB     Medical Summary Current Status: CHF, DM2, HTN, ESRD, Afib, anemia, Constipation  Barriers to Discharge: Hypotension;Incontinence;Self-care education;Medical stability  Barriers to Discharge Comments: CHF, DM2, HTN, ESRD, Afib, anemia, Constipation, CHF Possible Resolutions to [employer] Focus: continue HD per nephrology, monitor WT, adjust insulin, TED hose - he is declining, poor medical teatment complaince   Continued Need for Acute Rehabilitation Level of Care: The patient requires daily medical management by a physician with specialized training in physical medicine and rehabilitation for the following reasons: Direction of a multidisciplinary physical rehabilitation program to maximize functional independence :  Yes Medical management of patient stability for increased activity during participation in an intensive rehabilitation regime.: Yes Analysis of laboratory values and/or radiology reports with any subsequent need for medication adjustment and/or medical intervention. : Yes   I attest that I was present, lead the team conference, and concur with the assessment and plan of the team.   Chana Bode B 01/06/2023, 2:58 PM

## 2023-01-06 NOTE — Progress Notes (Signed)
Horton Chin, MD  Physician Physical Medicine and Rehabilitation   Consult Note    Signed   Date of Service: 12/25/2022  4:05 PM  Related encounter: ED to Hosp-Admission (Discharged) from 11/26/2022 in [facility]   Signed     Expand All Collapse All           Physical Medicine and Rehabilitation Consult Reason for Consult: Cardiac debility Referring Physician: Marcelino Duster, MD     HPI: Kyle Roberson is a 82 y.o. male with type 2 diabetes, HTN, CAD, chronic diastolic congestive heart failure, atrial fibrillation, pulmonary hypertension, and a history of subdural hematoma, who was admitted on 11/26/22 with acute on chronic diastolic CHF. Physical Medicine & Rehabilitation was consulted to assess candidacy for CIR.       ROS lower extremity edema improved     Past Medical History:  Diagnosis Date   AKI (acute kidney injury) (HCC) 07/06/2018   CAD (coronary artery disease)      a. Remote PCI/stenting to LAD w PTCA Diagnoal. RCA 60%; b.  2005/2008 Cardiolites w/ reportedly mild ischemia in Diag territory-->Med rx.   Cellulitis of lower extremity 05/31/2019   Chronic heart failure with preserved ejection fraction (HFpEF) (HCC)      a. 04/2019 Echo: EF 55-60%, no rwma, nl RV fxn, RVSP 55.65mmHg. Mod dil LA. Mild-mod MR. Mod dil PA.   CKD (chronic kidney disease), stage IV (HCC)     Diabetes (HCC)     GERD (gastroesophageal reflux disease)     History of MI (myocardial infarction) 06/27/2014   HLD (hyperlipidemia)     HTN (hypertension)     PAH (pulmonary artery hypertension) (HCC)      a. 04/2019 Echo: RVSP 55.55mmHg.   Permanent atrial fibrillation (HCC)      a. CHA2DS2VASc = 5-->dose adjusted eliquis (age/creat).   Subdural hematoma (HCC)      a. 01/2021 in setting of fall s/p L frontotemporal craniotomy.  [] Expand by Default           Past Surgical History:  Procedure Laterality Date   BLEPHAROPLASTY        CARDIAC CATHETERIZATION       CORONARY STENT INTERVENTION       CRANIOTOMY Left 02/04/2021    Procedure: CRANIOTOMY FOR LEFT SUBDURAL  HEMATOMA EVACUATION;  Surgeon: Lucy Chris, MD;  Location: [facility] ORS;  Service: Neurosurgery;  Laterality: Left;   CYSTOSCOPY       DIALYSIS/PERMA CATHETER INSERTION N/A 12/14/2022    Procedure: DIALYSIS/PERMA CATHETER INSERTION;  Surgeon: Annice Needy, MD;  Location: [facility] INVASIVE CV LAB;  Service: Cardiovascular;  Laterality: N/A;   DIALYSIS/PERMA CATHETER REPAIR N/A 12/16/2022    Procedure: DIALYSIS/PERMA CATHETER REPAIR;  Surgeon: Annice Needy, MD;  Location: [facility] INVASIVE CV LAB;  Service: Cardiovascular;  Laterality: N/A;   HERNIA REPAIR       RIGHT HEART CATH N/A 12/02/2022    Procedure: RIGHT HEART CATH;  Surgeon: Laurey Morale, MD;  Location: [facility] INVASIVE CV LAB;  Service: Cardiovascular;  Laterality: N/A;             Family History  Problem Relation Age of Onset   Pancreatic cancer Mother     CAD Father     Diabetes Brother          Social History:  reports that he has quit smoking. He has never used smokeless tobacco. He reports that he does not currently use alcohol after  a past usage of about 2.0 standard drinks of alcohol per week. He reports that he does not currently use drugs. Allergies:  Allergies       Allergies  Allergen Reactions   Atorvastatin Hives, Itching and Other (See Comments)      Other reaction(s): Other (See Comments)   Simvastatin Hives, Itching and Other (See Comments)      Other reaction(s): Other (See Comments)            Medications Prior to Admission  Medication Sig Dispense Refill   apixaban (ELIQUIS) 2.5 MG TABS tablet Take 1 tablet (2.5 mg total) by mouth 2 (two) times daily. 180 tablet 1   Ascorbic Acid (VITAMIN C PO) Take 1 tablet by mouth in the morning.       Cholecalciferol (VITAMIN D3) 1.25 MG (50000 UT) CAPS Take 1 tablet by mouth in the morning.       Coenzyme Q10-Vitamin E (QUNOL ULTRA COQ10  PO) Take 1 capsule by mouth in the morning.       doxazosin (CARDURA) 1 MG tablet TAKE 1 TABLET(1 MG) BY MOUTH TWICE DAILY 60 tablet 1   Flaxseed, Linseed, (FLAXSEED OIL PO) Take 1 capsule by mouth in the morning.       Furosemide (FUROSCIX) 80 MG/10ML CTKT Inject 80 mg into the skin as directed. 5 each 0   furosemide (LASIX) 40 MG tablet Take 1 tablet daily every other day, alternating with 2 tabs daily every other day 45 tablet 3   glipiZIDE (GLUCOTROL XL) 5 MG 24 hr tablet Take 1 tablet (5 mg total) by mouth daily with breakfast. (Patient taking differently: Take 10 mg by mouth daily with breakfast.) 90 tablet 1   hydrALAZINE (APRESOLINE) 100 MG tablet TAKE 1 TABLET BY MOUTH THREE TIMES DAILY, WITH EXTRA 100 MG AS NEEDED FOR HIGH PRESSURE 90 tablet 1   isosorbide mononitrate (IMDUR) 30 MG 24 hr tablet Take 1 tablet (30 mg total) by mouth in the morning, at noon, and at bedtime. 90 tablet 6   Multiple Vitamin (MULTIVITAMIN WITH MINERALS) TABS tablet Take 1 tablet by mouth in the morning.       neomycin-polymyxin b-dexamethasone (MAXITROL) 3.5-10000-0.1 OINT Place 1 Application into the right eye 3 (three) times daily.       nitroGLYCERIN (NITROLINGUAL) 0.4 MG/SPRAY spray Place 1 spray under the tongue as directed. 12 g 0   Omega-3 Fatty Acids (FISH OIL PO) Take 1 capsule by mouth in the morning.       rosuvastatin (CRESTOR) 10 MG tablet Take 1 tablet (10 mg total) by mouth 3 (three) times a week. 45 tablet 3   Turmeric (QC TUMERIC COMPLEX PO) Take 1 capsule by mouth in the morning. Qunol Turmeric Curcumin- 40 MG              Home: Home Living Family/patient expects to be discharged to:: Private residence Living Arrangements: Spouse/significant other Available Help at Discharge: Family, Available PRN/intermittently (wife works outside the home) Type of Home: House Home Access: Ramped entrance Home Layout: One level Bathroom Shower/Tub: Psychologist, counselling, Nurse, adult Toilet:  Handicapped height Bathroom Accessibility: Yes Home Equipment: Medical laboratory scientific officer - quad, Information systems manager, Grab bars - toilet, Grab bars - tub/shower, Hand held shower head, Wheelchair - manual, Rollator (4 wheels) Additional Comments: lift chair  Functional History: Prior Function Prior Level of Function : Needs assist Mobility Comments: per wife AMB withquad cane or nothing for household mobility; at baseline does not go out of house  for mobility unless needed ADLs Comments: Wife assists with ADL (min-modA) Functional Status:  Mobility: Bed Mobility Overal bed mobility: Needs Assistance Bed Mobility: Sit to Supine Rolling: +2 for physical assistance Sidelying to sit: Mod assist, +2 for physical assistance Supine to sit: Min assist Sit to supine: Mod assist General bed mobility comments: assist with BLE and trunk management but steadily improving with initiation of task Transfers Overall transfer level: Needs assistance Equipment used: Rolling walker (2 wheels) Transfers: Bed to chair/wheelchair/BSC Sit to Stand: Mod assist, +2 physical assistance Bed to/from chair/wheelchair/BSC transfer type:: Step pivot Step pivot transfers: Mod assist, +2 physical assistance, Max assist  Lateral/Scoot Transfers: Max assist, +2 physical assistance General transfer comment: able to perform 2x STS from recliner mod-max x1 for fatigued following second rep; SPT from recliner to bed mod A x2 for safety due to poor endurance Ambulation/Gait General Gait Details: Deferred at this time Pre-gait activities: marching in place performed with Min A +2 using rolling walker for 5 seconds x 2 bouts. emphasis on upright standing posture, hip extension and avoid flexed posture, increasing standing tolerance in preparation for standing. side steps performed to the right with Min A +2 person required for safety   ADL: ADL Overall ADL's : Needs assistance/impaired Grooming: Sitting, Wash/dry hands, Wash/dry face, Set up,  Supervision/safety Lower Body Bathing: Sitting/lateral leans, Moderate assistance, Maximal assistance Toilet Transfer: +2 for physical assistance, BSC/3in1, Stand-pivot, Rolling walker (2 wheels) Toilet Transfer Details (indicate cue type and reason): anticipated Toileting- Clothing Manipulation and Hygiene: Maximal assistance, Sit to/from stand Functional mobility during ADLs: Rolling walker (2 wheels), Minimal assistance, Moderate assistance General ADL Comments: SETUP for UB washing, MAX A for LB bathing in sitting.   Cognition: Cognition Overall Cognitive Status: Within Functional Limits for tasks assessed Orientation Level: Oriented X4 Cognition Arousal: Alert Behavior During Therapy: WFL for tasks assessed/performed Overall Cognitive Status: Within Functional Limits for tasks assessed General Comments: AO x4; pleasant and cooperative for therapy   Blood pressure (!) 143/65, pulse 67, temperature 97.8 F (36.6 C), temperature source Oral, resp. rate 16, height 5\' 8"  (1.727 m), weight 93.3 kg, SpO2 100%. Physical Exam Gen: no distress, normal appearing HEENT: oral mucosa pink and moist, NCAT Cardio: Reg rate Chest: normal effort, normal rate of breathing Abd: soft, non-distended Ext: +lower extremity edema Psych: pleasant, normal affect Skin: intact Neuro: Alert and oriented x3 MSK: no focal deficits   Lab Results Last 24 Hours       Results for orders placed or performed during the hospital encounter of 11/26/22 (from the past 24 hour(s))  Glucose, capillary     Status: Abnormal    Collection Time: 12/24/22  5:01 PM  Result Value Ref Range    Glucose-Capillary 198 (H) 70 - 99 mg/dL  Glucose, capillary     Status: Abnormal    Collection Time: 12/24/22  9:27 PM  Result Value Ref Range    Glucose-Capillary 292 (H) 70 - 99 mg/dL  Renal function panel (daily at 0500)     Status: Abnormal    Collection Time: 12/25/22  6:45 AM  Result Value Ref Range    Sodium 137 135 -  145 mmol/L    Potassium 3.9 3.5 - 5.1 mmol/L    Chloride 98 98 - 111 mmol/L    CO2 25 22 - 32 mmol/L    Glucose, Bld 136 (H) 70 - 99 mg/dL    BUN 59 (H) 8 - 23 mg/dL    Creatinine, Ser 4.09 (H)  0.61 - 1.24 mg/dL    Calcium 8.1 (L) 8.9 - 10.3 mg/dL    Phosphorus 4.1 2.5 - 4.6 mg/dL    Albumin 3.3 (L) 3.5 - 5.0 g/dL    GFR, Estimated 19 (L) >60 mL/min    Anion gap 14 5 - 15  Glucose, capillary     Status: Abnormal    Collection Time: 12/25/22  8:07 AM  Result Value Ref Range    Glucose-Capillary 110 (H) 70 - 99 mg/dL  Glucose, capillary     Status: Abnormal    Collection Time: 12/25/22 12:04 PM  Result Value Ref Range    Glucose-Capillary 221 (H) 70 - 99 mg/dL      Imaging Results (Last 48 hours)  No results found.     Assessment/Plan: Diagnosis: Cardiac debility Does the need for close, 24 hr/day medical supervision in concert with the patient's rehab needs make it unreasonable for this patient to be served in a less intensive setting? Yes Co-Morbidities requiring supervision/potential complications:  1) Type 2 diabetes: holding empagliflozin 2) CHF: continue furosemide 3) Anemia: continue EPO 4) Secondary hyperparathyroidism 5) ESRD Due to bladder management, bowel management, safety, skin/wound care, disease management, medication administration, pain management, and patient education, does the patient require 24 hr/day rehab nursing? Yes Does the patient require coordinated care of a physician, rehab nurse, therapy disciplines of PT, OT to address physical and functional deficits in the context of the above medical diagnosis(es)? Yes Addressing deficits in the following areas: balance, endurance, locomotion, strength, transferring, bowel/bladder control, bathing, dressing, feeding, grooming, toileting, and psychosocial support Can the patient actively participate in an intensive therapy program of at least 3 hrs of therapy per day at least 5 days per week? Yes The potential  for patient to make measurable gains while on inpatient rehab is excellent Anticipated functional outcomes upon discharge from inpatient rehab are supervision  with PT, supervision with OT, supervision with SLP. Estimated rehab length of stay to reach the above functional goals is: 14-16 days Anticipated discharge destination: Home Overall Rehab/Functional Prognosis: excellent   POST ACUTE RECOMMENDATIONS: This patient's condition is appropriate for continued rehabilitative care in the following setting: CIR Patient has agreed to participate in recommended program. Yes Note that insurance prior authorization may be required for reimbursement for recommended care.         I have personally performed a face to face diagnostic evaluation of this patient. Additionally, I have examined the patient's medical record including any pertinent labs and radiographic images. If the physician assistant has documented in this note, I have reviewed and edited or otherwise concur with the physician assistant's documentation.   Thanks,   Horton Chin, MD 12/25/2022          Routing History

## 2023-01-06 NOTE — Progress Notes (Signed)
Patient ID: Kyle Roberson, male   DOB: 02-14-1941, 82 y.o.   MRN: [id number]  Met with pt and did talk with wife via telephone to discuss team conference with goals of supervision-min level and target discharge of 10/28. Wife reports she will not leave pt alone and for the first two weeks she will be there with him.  Have scheduled family training for 10/27 from 1:00-3:00 with wife. Wife is aware pt is tired and been in a hospital for a long time and at times can be crappy at times. He especially does not like going to HD late. Will continue to work on discharge needs.

## 2023-01-07 DIAGNOSIS — M549 Dorsalgia, unspecified: Secondary | ICD-10-CM

## 2023-01-07 DIAGNOSIS — I5032 Chronic diastolic (congestive) heart failure: Secondary | ICD-10-CM

## 2023-01-07 LAB — GLUCOSE, CAPILLARY
Glucose-Capillary: 80 mg/dL (ref 70–99)
Glucose-Capillary: 219 mg/dL — ABNORMAL HIGH (ref 70–99)
Glucose-Capillary: 110 mg/dL — ABNORMAL HIGH (ref 70–99)

## 2023-01-07 LAB — RENAL FUNCTION PANEL
Albumin: 3 g/dL — ABNORMAL LOW (ref 3.5–5.0)
Anion gap: 17 — ABNORMAL HIGH (ref 5–15)
BUN: 81 mg/dL — ABNORMAL HIGH (ref 8–23)
CO2: 21 mmol/L — ABNORMAL LOW (ref 22–32)
Calcium: 8.1 mg/dL — ABNORMAL LOW (ref 8.9–10.3)
Chloride: 96 mmol/L — ABNORMAL LOW (ref 98–111)
Creatinine, Ser: 7.43 mg/dL — ABNORMAL HIGH (ref 0.61–1.24)
GFR, Estimated: 7 mL/min — ABNORMAL LOW (ref >60)
Glucose, Bld: 171 mg/dL — ABNORMAL HIGH (ref 70–99)
Phosphorus: 7.6 mg/dL — ABNORMAL HIGH (ref 2.5–4.6)
Potassium: 4 mmol/L (ref 3.5–5.1)
Sodium: 134 mmol/L — ABNORMAL LOW (ref 135–145)

## 2023-01-07 LAB — CBC
HCT: 36.7 % — ABNORMAL LOW (ref 39.0–52.0)
Hemoglobin: 11.7 g/dL — ABNORMAL LOW (ref 13.0–17.0)
MCH: 29.8 pg (ref 26.0–34.0)
MCHC: 31.9 g/dL (ref 30.0–36.0)
MCV: 93.4 fL (ref 80.0–100.0)
Platelets: 211 10*3/uL (ref 150–400)
RBC: 3.93 MIL/uL — ABNORMAL LOW (ref 4.22–5.81)
RDW: 14.6 % (ref 11.5–15.5)
WBC: 10.1 10*3/uL (ref 4.0–10.5)
nRBC: 0 % (ref 0.0–0.2)

## 2023-01-07 LAB — IRON AND TIBC
Iron: 61 ug/dL (ref 45–182)
Saturation Ratios: 19 % (ref 17.9–39.5)
TIBC: 326 ug/dL (ref 250–450)
UIBC: 265 ug/dL

## 2023-01-07 LAB — HEPATITIS B SURFACE ANTIBODY, QUANTITATIVE: Hep B S AB Quant (Post): <3.5 m[IU]/mL — ABNORMAL LOW

## 2023-01-07 MED ORDER — PENTAFLUOROPROP-TETRAFLUOROETH EX AERO: 1.0000 | INHALATION_SPRAY | CUTANEOUS | Status: DC | PRN

## 2023-01-07 MED ORDER — ALTEPLASE 2 MG IJ SOLR: 2.0000 mg | Freq: Once | INTRAMUSCULAR | Status: DC | PRN

## 2023-01-07 MED ORDER — HEPARIN SODIUM (PORCINE) 1000 UNIT/ML DIALYSIS
4000.0000 [IU] | Freq: Once | INTRAMUSCULAR | Status: AC
  Administered 2023-01-07: 4000 [IU] via INTRAVENOUS_CENTRAL
  Filled 2023-01-07 (×2): qty 4

## 2023-01-07 MED ORDER — HEPARIN SODIUM (PORCINE) 1000 UNIT/ML DIALYSIS
1000.0000 [IU] | INTRAMUSCULAR | Status: DC | PRN
  Administered 2023-01-07 (×2): 1000 [IU]

## 2023-01-07 MED ORDER — LIDOCAINE-PRILOCAINE 2.5-2.5 % EX CREA: 1.0000 | TOPICAL_CREAM | CUTANEOUS | Status: DC | PRN

## 2023-01-07 MED ORDER — ANTICOAGULANT SODIUM CITRATE 4% (200MG/5ML) IV SOLN
5.0000 mL | Status: DC | PRN
  Filled 2023-01-07: qty 5

## 2023-01-07 MED ORDER — INSULIN GLARGINE-YFGN 100 UNIT/ML ~~LOC~~ SOLN
16.0000 [IU] | Freq: Every day | SUBCUTANEOUS | Status: DC
  Administered 2023-01-08 – 2023-01-09 (×2): 16 [IU] via SUBCUTANEOUS
  Filled 2023-01-07 (×3): qty 0.16

## 2023-01-07 MED ORDER — LIDOCAINE HCL (PF) 1 % IJ SOLN: 5.0000 mL | INTRAMUSCULAR | Status: DC | PRN

## 2023-01-07 NOTE — Progress Notes (Signed)
Occupational Therapy Session Note  Patient Details  Name: Kyle Roberson MRN: [id number] Date of Birth: 1940-05-02  Today's Date: 01/07/2023 OT Individual Time: 0865-7846 OT Individual Time Calculation (min): 74 min    Short Term Goals: Week 1:  OT Short Term Goal 1 (Week 1): Pt will stand up to 2 min for grooming sink side level OT Short Term Goal 2 (Week 1): Pt will complete LB self care with AE as needed with mod A OT Short Term Goal 3 (Week 1): Pt will complete toilet transfer with CGA using LRAD  Skilled Therapeutic Interventions/Progress Updates:  Pt seen for skilled OT session. Wife has brought in regular clothes and pt agreeable to don. Sink side oral care seated and standing with set up and CGA. UB dressing with pull over shirt with set up and CGA LB dressing with use of reacher, sock aide and LH shoe horn with max fading to mod A. Overall including Velcro closure tennis shoes. Sit to stand for pulling up with attempts  x 2 to power up then cues for forward weight shifting with min A to RW. Pt amb with RW with from sink side to window and back with CGA with w/c follow 25 ft to retrieve item from sill.  OT transported pt to ADL apt to kitchen for time management. Pt stood 3 trials x 2 min with seated rest for simple reaching and weight shifting task with CGA. Pt issued medium foam grasp trainer for overall strength between visits. Pt completed 3 sets of 10 reps grasp and pinch. Once back in room, OT set pt up with all needs, nurse call button and chair alarm set.   Pain: denies pain   Therapy Documentation Precautions:  Precautions Precautions: Fall Restrictions Weight Bearing Restrictions: No   Therapy/Group: Individual Therapy  Vicenta Dunning 01/07/2023, 8:00 AM

## 2023-01-07 NOTE — Procedures (Signed)
Received patient in bed to unit.  Alert and oriented.  Informed consent signed and in chart.   TX duration: 3.5hrs  Episode of hypotension.  Transported back to the room  Alert, without acute distress.  Hand-off given to patient's nurse.   Access used: Right chest HD catheter.  Access issues: NONE  Total UF removed: 1.5L Medication(s) given: NONE    [facility]

## 2023-01-07 NOTE — Progress Notes (Signed)
[facility] Progress Note   Subjective:  Seen in room - working with PT. Feels well, no CP/dyspnea. For HD today.   Objective Vitals:   01/06/23 1359 01/06/23 1814 01/06/23 1946 01/07/23 0606  BP: (!) 102/47  (!) 118/51 (!) 146/68  Pulse: 66  (!) 59 (!) 56  Resp: 20  18 17   Temp: 98.5 F (36.9 C)  97.6 F (36.4 C) 97.6 F (36.4 C)  TempSrc:   Oral Oral  SpO2: 99%  99% 92%  Weight:  88.3 kg  91 kg   Physical Exam General: Elderly man, NAD. Room air. Lower eyelid ectropion; clear discharge Heart: RRR; no murmur Lungs: CTAB; no rales Abdomen: distended, non-tender Extremities:Hypertrophic, wrinkled skin changes to BLE (s/p edema/lymphedema improvement) Dialysis Access: [facility] in R chest  Additional Objective Labs: Basic Metabolic Panel: Recent Labs  Lab 01/02/23 0523 01/03/23 0432 01/04/23 0505  NA 136 137 140  K 3.8 3.7 4.1  CL 98 97* 98  CO2 23 25 23   GLUCOSE 113* 80 66*  BUN 65* 43* 69*  CREATININE 5.32* 3.82* 5.71*  CALCIUM 7.9* 8.3* 8.4*  PHOS 6.1* 4.8* 7.2*   Liver Function Tests: Recent Labs  Lab 01/02/23 0523 01/03/23 0432 01/04/23 0505  ALBUMIN 3.1* 3.3* 3.1*   CBC: Recent Labs  Lab 01/01/23 0620 01/02/23 0522 01/04/23 0505  WBC 9.3 9.7 10.2  NEUTROABS 7.0 7.1 7.3  HGB 11.3* 10.9* 11.6*  HCT 34.9* 32.7* 34.7*  MCV 91.6 89.8 89.4  PLT 205 199 222   Medications:   (feeding supplement) PROSource Plus  30 mL Oral BID BM   apixaban  2.5 mg Oral BID   vitamin C  500 mg Oral BID   Chlorhexidine Gluconate Cloth  6 each Topical Q12H   Chlorhexidine Gluconate Cloth  6 each Topical Q0600   cholecalciferol  2,000 Units Oral Daily   clotrimazole-betamethasone   Topical BID   docusate sodium  100 mg Oral BID   fluticasone furoate-vilanterol  1 puff Inhalation Daily   furosemide  80 mg Oral Q M,W,F   hydrALAZINE  25 mg Oral TID   hydrocerin   Topical TID   insulin aspart  0-15 Units Subcutaneous TID AC & HS   insulin aspart  3  Units Subcutaneous TID WC   insulin glargine-yfgn  18 Units Subcutaneous Daily   isosorbide mononitrate  60 mg Oral Daily   melatonin  5 mg Oral QHS   multivitamin  1 tablet Oral QHS   neomycin-polymyxin b-dexamethasone  1 Application Right Eye TID   omega-3 acid ethyl esters  1 g Oral Daily   pantoprazole  40 mg Oral Daily   rosuvastatin  10 mg Oral Once per day on Monday Wednesday Friday   sevelamer carbonate  800 mg Oral TID WC   Background: Kyle Roberson is a 82 y.o. male with (newly declared during [facility] admit) ESRD, T2DM, HTN, CAD, HFpEF, A-fib (on Eliquis), and Hx SDH (01/2021) who was transferred from [facility] to CIR for ongoing rehab.    Dialysis Orders: Has been accepted to [facility] on TTS per notes (will start after CIR d/c) Establishing orders.    Assessment/Plan:  Debility: Admitted to CIR for ongoing rehab. ESRD:  Continue HD per TTS schedule - HD today.  Using [facility]. Have not pursued permanent access at this time, may be hindrance to his therapy sessions to do inpatient. Hypertension/volume: BP decent, abdomen distended - keep challenging weight down to establish dry weight. Anemia of ESRD:  Hgb > 11.5 at this time, no ESA needs. Will check iron studies. Metabolic bone disease: Ca ok, Phos high. Does not appear that he was getting a binder - Renvela 1/meals started. Check PTH.  Nutrition: Alb low, continue renal diet + prot supps. T2DM HFpEF  Kyle Hoyle, PA-C 01/07/2023, 10:21 AM  [employer]

## 2023-01-07 NOTE — IPOC Note (Signed)
Overall Plan of Care [facility]) Patient Details Name: Kyle Roberson MRN: [id number] DOB: 12/26/1940  Admitting Diagnosis: Debility  Hospital Problems: Principal Problem:   Debility     Functional Problem List: Nursing Bladder, Bowel, Edema, Endurance, Medication Management, Pain, Safety, Perception  PT Balance, Edema, Endurance, Nutrition, Pain, Sensory, Skin Integrity, Safety  OT Balance, Cognition, Endurance, Motor, Safety, Skin Integrity  SLP    TR         Basic ADL's: OT Grooming, Bathing, Dressing, Toileting     Advanced  ADL's: OT Simple Meal Preparation, Laundry, Light Housekeeping     Transfers: PT Bed Mobility, Bed to Chair, Car, Occupational psychologist, Research scientist (life sciences): PT Ambulation, Psychologist, prison and probation services, Stairs     Additional Impairments: OT Fuctional Use of Upper Extremity  SLP        TR      Anticipated Outcomes Item Anticipated Outcome  Self Feeding indep  Swallowing      Basic self-care  mod I UB selfcare, min A LB selfcare, S for transfers/mobility  Toileting  Sopervision   Bathroom Transfers Supervsion  Bowel/Bladder  manage bowel and bladder w min assist  Transfers  Mod I with LRAD  Locomotion  supervision with LRAD  Communication     Cognition     Pain  < 4 with prns  Safety/Judgment  manage safety w cues   Therapy Plan: PT Intensity: Minimum of 1-2 x/day ,45 to 90 minutes PT Frequency: 5 out of 7 days PT Duration Estimated Length of Stay: 2 weeks OT Intensity: Minimum of 1-2 x/day, 45 to 90 minutes OT Frequency: 5 out of 7 days OT Duration/Estimated Length of Stay: 12-14 days     Team Interventions: Nursing Interventions Patient/Family Education, Pain Management, Bladder Management, Medication Management, Discharge Planning, Bowel Management, Disease Management/Prevention, Cognitive Remediation/Compensation, Psychosocial Support  PT interventions Ambulation/gait training, Discharge planning, Functional mobility  training, Psychosocial support, Therapeutic Activities, Visual/perceptual remediation/compensation, Balance/vestibular training, Disease management/prevention, Neuromuscular re-education, Skin care/wound management, Therapeutic Exercise, Wheelchair propulsion/positioning, Cognitive remediation/compensation, DME/adaptive equipment instruction, Pain management, Splinting/orthotics, UE/LE Strength taining/ROM, Community reintegration, Equities trader education, Museum/gallery curator, UE/LE Coordination activities, Functional electrical stimulation  OT Interventions Warden/ranger, Discharge planning, Functional electrical stimulation, Pain management, Self Care/advanced ADL retraining, Therapeutic Activities, UE/LE Coordination activities, Cognitive remediation/compensation, Disease mangement/prevention, Functional mobility training, Patient/family education, Skin care/wound managment, Therapeutic Exercise, Visual/perceptual remediation/compensation, Firefighter, Fish farm manager, Neuromuscular re-education, Psychosocial support, Splinting/orthotics, UE/LE Strength taining/ROM, Wheelchair propulsion/positioning  SLP Interventions    TR Interventions    SW/CM Interventions Discharge Planning, Psychosocial Support, Patient/Family Education   Barriers to Discharge MD  Medical stability  Nursing Hemodialysis 1 level ramped entry/handicapped access bath, wife to provide 24/7 assistance  PT Decreased caregiver support, Hemodialysis, Other (comments) pain, wife in/out during the day, global weakness/deconditioning  OT Inaccessible home environment, Home environment access/layout, Other (comments) new HD, stall shower with step in  SLP      SW       Team Discharge Planning: Destination: PT-Home ,OT- Home , SLP-  Projected Follow-up: PT-Home health PT, OT-  Home health OT, SLP-  Projected Equipment Needs: PT-To be determined, OT- Tub/shower bench, SLP-  Equipment  Details: PT-has RW, cane, and WC, OT-if unable to step into stall by d/c Patient/family involved in discharge planning: PT- Patient,  OT-Patient, SLP-   MD ELOS: 14 Medical Rehab Prognosis:  Excellent Assessment: The patient has been admitted for CIR therapies with the diagnosis of CHF . The team will be addressing  functional mobility, strength, stamina, balance, safety, adaptive techniques and equipment, self-care, bowel and bladder mgt, patient and caregiver education. Goals have been set at supervision to min A. Anticipated discharge destination is home.        See Team Conference Notes for weekly updates to the plan of care

## 2023-01-07 NOTE — Progress Notes (Signed)
Physical Therapy Session Note  Patient Details  Name: Kyle Roberson MRN: [id number] Date of Birth: [birth year]  Today's Date: 01/07/2023 PT Individual Time: 0800-0859, 1100-1158 PT Individual Time Calculation (min): 59 min, 58 min   Short Term Goals: Week 1:  PT Short Term Goal 1 (Week 1): pt will perform bed mobility with min A consistantly PT Short Term Goal 2 (Week 1): pt will transfer sit<>stand with LRAD and CGA consistantly PT Short Term Goal 3 (Week 1): pt will ambulate 35ft with LRAD and min A  Skilled Therapeutic Interventions/Progress Updates:      Treatment Session 1  Pt supine in bed upon arrival. Pt denies any pain, pt has blue bag on lap, and reports inability to vomit but reports nausea and discomfort. Pt agreeable to therapy.   Pt performed rolling R and L with min A with use of bedrails. Pt performed supine to sit with min-mod A with use of bed rails with HOB flat, verbal cues provided for technique. Pt reports he sleeps in lift chair at home. Discussed practicing getting on and off of apartment recliner. Pt reports he will try when he is feeling better.   Sit to stand from level bed with RW and mod A, verbal cues provided for technique.   Mass practice of sit to stand from elevated hospital bed (progressively lowering the bed), with min A progressing to supervision with hips higher than knees,  verbal cues provided for technique with emphasis on scooting forward, use of B UE on bed for power up, and rocking forward x3 for momentum,   Discussed pt furniture at home, pt reports he sits in lift chair majority of the time at home, but also standard chair on porch outside. Transitioned to sit to stand practice on standard chair in room, Pt performed sit to stand x5 with no AD and B UE support on arm rests with CGA progressing to supervision, pt demos improve technique, verbal cues provided for upright posture once standing.   Pt ambulated 1x30 feet with no AD and L HHA  with min-mod A, with wide BOS and shuffle steps.   Pt picked up pin from floor with no AD and max A, and with RW and mod A education provided for need for reacher to reduce fall risk and improve independence. Pt reports he has multiple at home. Recommended pt get bag on front of RW to keep reacher with him at all times, pt verbalized understanding and agreeable.   Pt self propelled WC 50 feet  with B UE and min A and increased time, verbal cues provided fr techniuqe.   Pt seated in WC with all needs in reach eating breakfast, with seatbelt alarm on at end of session.     Treatment Session 2   Pt seated in WC upon arrival. Pt agreeable to therapy. Pt denies any pain.   Pt transported dependent in [facility] to ortho gym for time/energy conservation. Pt performed stand pivot transfer to car simulator at height of SUV (pt has [employer]), with mod A for power up with RW, and CGA for pivot, and mod A for posterior scoot onto seat, and management of B LE into car. Discussed with pt, getting measurements of car height for accurate simulation. Discussed next session when pt is not as fatigued, trailing backwards step up onto step for car entry for improved buttock positioning. Pt reports he typically steps up onto a cinder block with pt wife providing assistance.   Pt ambulated x100  feet with RW and min A for management of RW as pt demos heavy B UE on RW, verbal cues provided for safety with RW with emphasis on standing within frame of RW. Pt makes adjustment in standing but reports diffiuculty to maintain with ambulation. Pt ambulated 50 feet with no AD and mod A, for intermittent R LE buckling. Pt reports he plans to not use an AD upon discharge. Education provided that pt will likely need AD for improved stability to reduce fall risk. Pt reports plan to utilize furniture for support. Education provided on pt fall risk, and risk of fall related injury. Pt verbalized understanding. Will continue to encourage  use of AD for home.   Pt performed sit to stand throughout session with min-mod A, pt required frequent verbal cues for scooting forward, B UE support on mat table, WC, and forward trunk lean for momentum.   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   Pt performed stand pivot transfer WC to bed with RW and CGA, verbal cues provided for tehcnique. Pt seated EOB with bed alarm on and needs within reach at end of session.       Therapy Documentation Precautions:  Precautions Precautions: Fall Restrictions Weight Bearing Restrictions: No  Therapy/Group: Individual Therapy  [facility] Ambrose Finland, [place], DPT  01/07/2023, 7:30 AM

## 2023-01-07 NOTE — Progress Notes (Signed)
PROGRESS NOTE   Subjective/Complaints: No acute events overnight noted.  Patient reports mattress is uncomfortable, reports air mattress on acute was less painful in his back and allowed him to sleep better.  ROS: Patient denies fever, chills, headache, shortness of breath, chest pain, abdominal pain, rash + Generalized weakness  Objective:   No results found. No results for input(s): "WBC", "HGB", "HCT", "PLT" in the last 72 hours.  No results for input(s): "NA", "K", "CL", "CO2", "GLUCOSE", "BUN", "CREATININE", "CALCIUM" in the last 72 hours.   Intake/Output Summary (Last 24 hours) at 01/07/2023 1422 Last data filed at 01/07/2023 1317 Gross per 24 hour  Intake 1200 ml  Output --  Net 1200 ml        Physical Exam: Vital Signs Blood pressure (!) 112/53, pulse 64, temperature 97.7 F (36.5 C), resp. rate 17, height 5\' 8"  (1.727 m), weight 91 kg, SpO2 97%. Body mass index is 30.5 kg/m.  General: No acute distress, working with therapy Mood and affect are appropriate Head and neck: NCAT, bilateral eye ectropion Heart: RRR, no MRG Lungs: CTAB Abdomen: Positive bowel sounds, soft nontender to palpation, nondistended Extremities: No clubbing, cyanosis, or edema Skin: No evidence of breakdown, severe stasis dermatitis changes  TDC R chest wall  Neurologic: Alert and awake, follows commands, cranial nerves II through XII grossly intact, motor strength is 4/5 in bilateral deltoid, bicep, tricep, grip, hip flexor, knee extensors, ankle dorsiflexor and plantar flexor Sensory exam normal sensation to light touch and proprioception in bilateral upper and lower extremities No hypertonia noted Musculoskeletal: Full range of motion in all 4 extremities. No joint swelling  Assessment/Plan: 1. Functional deficits which require 3+ hours per day of interdisciplinary therapy in a comprehensive inpatient rehab setting. Physiatrist  is providing close team supervision and 24 hour management of active medical problems listed below. Physiatrist and rehab team continue to assess barriers to discharge/monitor patient progress toward functional and medical goals  Care Tool:  Bathing    Body parts bathed by patient: Right arm, Left arm, Chest, Abdomen, Face, Right upper leg, Right lower leg   Body parts bathed by helper: Left upper leg, Left lower leg, Buttocks     Bathing assist Assist Level: Maximal Assistance - Patient 24 - 49%     Upper Body Dressing/Undressing Upper body dressing   What is the patient wearing?: Hospital gown only    Upper body assist Assist Level: Minimal Assistance - Patient > 75%    Lower Body Dressing/Undressing Lower body dressing      What is the patient wearing?: Underwear/pull up     Lower body assist Assist for lower body dressing: Moderate Assistance - Patient 50 - 74%     Toileting Toileting    Toileting assist Assist for toileting: Maximal Assistance - Patient 25 - 49%     Transfers Chair/bed transfer  Transfers assist     Chair/bed transfer assist level: Minimal Assistance - Patient > 75%     Locomotion Ambulation   Ambulation assist   Ambulation activity did not occur: Safety/medical concerns (fatigue, pain)  Assist level: Moderate Assistance - Patient 50 - 74% Assistive device: No Device Max distance: 50  feet   Walk 10 feet activity   Assist  Walk 10 feet activity did not occur: Safety/medical concerns (fatigue, pain)  Assist level: Moderate Assistance - Patient - 50 - 74% Assistive device: No Device   Walk 50 feet activity   Assist Walk 50 feet with 2 turns activity did not occur: Safety/medical concerns  Assist level: Moderate Assistance - Patient - 50 - 74% Assistive device: No Device    Walk 150 feet activity   Assist Walk 150 feet activity did not occur: Safety/medical concerns         Walk 10 feet on uneven surface   activity   Assist Walk 10 feet on uneven surfaces activity did not occur: Safety/medical concerns (fatigue, pain)         Wheelchair     Assist Is the patient using a wheelchair?: Yes Type of Wheelchair: Manual Wheelchair activity did not occur: Safety/medical concerns (pain, fatigue)  Wheelchair assist level: Minimal Assistance - Patient > 75% Max wheelchair distance: 50    Wheelchair 50 feet with 2 turns activity    Assist    Wheelchair 50 feet with 2 turns activity did not occur: Safety/medical concerns (pain, fatigue)   Assist Level: Minimal Assistance - Patient > 75%   Wheelchair 150 feet activity     Assist  Wheelchair 150 feet activity did not occur: Safety/medical concerns (pain, fatigue)   Assist Level: Maximal Assistance - Patient 25 - 49%   Blood pressure (!) 112/53, pulse 64, temperature 97.7 F (36.5 C), resp. rate 17, height 5\' 8"  (1.727 m), weight 91 kg, SpO2 97%.  Medical Problem List and Plan: 1. Functional deficits secondary to CHF             -patient may not  shower, HD cath             -ELOS/Goals: 18-21d, pt  goal is home by Birthday on 10/29  -Continue CIR  -Est Discharge 01/18/23    2.  Antithrombotics: -DVT/anticoagulation:  Pharmaceutical: Eliquis             -antiplatelet therapy:    3. Pain Management: Tylenol as needed  -Will check to see if air mattress would be a possibility to help with his back pain and discomfort at night   4. Mood/Behavior/Sleep: LCSW to evaluate and provide emotional support             -antipsychotic agents: n/a   5. Neuropsych/cognition: This patient is  capable of making decisions on his  own behalf.   6. Skin/Wound Care: Routine skin care checks, Eucerin cream for stasis dermatitis   7. Fluids/Electrolytes/Nutrition: Strict Is and Os and follow-up chemistries per nephrology             -renal diet with FR             -continue vitamin C, D3, Renavit   8: Hypertension: monitor TID and  prn             -continue doxazosin 1 mg BID             -continue Lasix 80 mg daily             -continue hydralazine 25 mg TID  -BP has been somewhat labile, continue to monitor trend.  Monitor for symptoms of orthostatic hypotension, particularly after hemodialysis.  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 hypoglycemia  CBG (last 3)  Recent Labs    01/06/23 2054 01/07/23 0648 01/07/23 1204  GLUCAP 208* 80 219*       13: ESRD: HD on T/T/S via right internal jugular TDC  -Reviewed nephrology note-plan for HD today   14: Chronic diastolic CHF: daily weight (dry weight ~220#)  EF is normal  -(meds as in #8) -fluid balance>>HD -10/17 weight a little increased today, plan for HD today prior nephrology  Filed Weights   01/05/23 0500 01/06/23 1814 01/07/23 0606  Weight: 88.7 kg 88.3 kg 91 kg      15: Atrial fibrillation: on Eliquis - bradycardia , not taking BBs monitor rate  -Avoid negative chronotropic medications  Hr appears improved in upper 50s low 60s   16: COPD/acute hypoxic respiratory failure due to volume overload:  -continue Breo Ellipta             -Wean supplemental O2 as able   17: CAD: on statin, meds as in #8   18:  Obesity: BMI = 30.17  -Dietary education   19: Anemia of chronic inflammation, cardiorenal disease ESRD: -continue Epogen 4000 units every TTS on hemodialysis days as per nephrology     20: Bilateral lower extremity lymphedema, chronic: -continue Lotrisone cream twice daily      LOS: 3 days A FACE TO FACE EVALUATION WAS PERFORMED  Fanny Dance 01/07/2023, 2:22 PM

## 2023-01-08 LAB — GLUCOSE, CAPILLARY
Glucose-Capillary: 115 mg/dL — ABNORMAL HIGH (ref 70–99)
Glucose-Capillary: 246 mg/dL — ABNORMAL HIGH (ref 70–99)
Glucose-Capillary: 92 mg/dL (ref 70–99)
Glucose-Capillary: 107 mg/dL — ABNORMAL HIGH (ref 70–99)

## 2023-01-08 LAB — PARATHYROID HORMONE, INTACT (NO CA): PTH: 344 pg/mL — ABNORMAL HIGH (ref 15–65)

## 2023-01-08 MED ORDER — SEVELAMER CARBONATE 800 MG PO TABS
1600.0000 mg | ORAL_TABLET | Freq: Three times a day (TID) | ORAL | Status: DC
  Administered 2023-01-08 – 2023-01-18 (×26): 1600 mg via ORAL
  Filled 2023-01-08 (×28): qty 2

## 2023-01-08 MED ORDER — CHLORHEXIDINE GLUCONATE CLOTH 2 % EX PADS
6.0000 | MEDICATED_PAD | Freq: Two times a day (BID) | CUTANEOUS | Status: DC
  Administered 2023-01-10 – 2023-01-17 (×5): 6 via TOPICAL

## 2023-01-08 MED ORDER — CHLORHEXIDINE GLUCONATE CLOTH 2 % EX PADS: 6.0000 | MEDICATED_PAD | Freq: Every day | CUTANEOUS | Status: DC

## 2023-01-08 NOTE — Progress Notes (Signed)
PROGRESS NOTE   Subjective/Complaints: No acute events noted overnight.  Patient reports having some disagreements with staff member overnight and different staff member at dialysis yesterday.  No additional concerns this morning.  ROS: Patient denies fever, chills, headache, shortness of breath, chest pain, abdominal pain, nausea, vomiting + Generalized weakness  Objective:   No results found. Recent Labs    01/07/23 1452  WBC 10.1  HGB 11.7*  HCT 36.7*  PLT 211    Recent Labs    01/07/23 1452  NA 134*  K 4.0  CL 96*  CO2 21*  GLUCOSE 171*  BUN 81*  CREATININE 7.43*  CALCIUM 8.1*     Intake/Output Summary (Last 24 hours) at 01/08/2023 1418 Last data filed at 01/07/2023 1845 Gross per 24 hour  Intake --  Output 1500 ml  Net -1500 ml        Physical Exam: Vital Signs Blood pressure (!) 114/52, pulse (!) 42, temperature 97.8 F (36.6 C), temperature source Oral, resp. rate 18, height 5\' 8"  (1.727 m), weight 81.8 kg, SpO2 94%. Body mass index is 27.42 kg/m.  General: No acute distress, lying in bed Mood and affect are appropriate Head and neck: NCAT, bilateral eye ectropion Heart: RRR, no MRG Lungs: CTAB, normal work of breathing on room air Abdomen: Positive bowel sounds, soft nontender to palpation, nondistended Extremities: No clubbing, cyanosis, or edema Skin: No evidence of breakdown, severe stasis dermatitis changes  TDC R chest wall  Neurologic: Alert and awake, follows commands, cranial nerves II through XII grossly intact, motor strength is 4/5 in bilateral deltoid, bicep, tricep, grip, hip flexor, knee extensors, ankle dorsiflexor and plantar flexor Sensory exam normal sensation to light touch and proprioception in bilateral upper and lower extremities No hypertonia noted Musculoskeletal: Full range of motion in all 4 extremities. No joint swelling  Assessment/Plan: 1. Functional  deficits which require 3+ hours per day of interdisciplinary therapy in a comprehensive inpatient rehab setting. Physiatrist is providing close team supervision and 24 hour management of active medical problems listed below. Physiatrist and rehab team continue to assess barriers to discharge/monitor patient progress toward functional and medical goals  Care Tool:  Bathing    Body parts bathed by patient: Right arm, Left arm, Chest, Abdomen, Face, Right upper leg, Right lower leg   Body parts bathed by helper: Left upper leg, Left lower leg, Buttocks     Bathing assist Assist Level: Maximal Assistance - Patient 24 - 49%     Upper Body Dressing/Undressing Upper body dressing   What is the patient wearing?: Hospital gown only    Upper body assist Assist Level: Minimal Assistance - Patient > 75%    Lower Body Dressing/Undressing Lower body dressing      What is the patient wearing?: Underwear/pull up     Lower body assist Assist for lower body dressing: Moderate Assistance - Patient 50 - 74%     Toileting Toileting    Toileting assist Assist for toileting: Maximal Assistance - Patient 25 - 49%     Transfers Chair/bed transfer  Transfers assist     Chair/bed transfer assist level: Minimal Assistance - Patient > 75%  Locomotion Ambulation   Ambulation assist   Ambulation activity did not occur: Safety/medical concerns (fatigue, pain)  Assist level: Moderate Assistance - Patient 50 - 74% Assistive device: No Device Max distance: 50 feet   Walk 10 feet activity   Assist  Walk 10 feet activity did not occur: Safety/medical concerns (fatigue, pain)  Assist level: Moderate Assistance - Patient - 50 - 74% Assistive device: No Device   Walk 50 feet activity   Assist Walk 50 feet with 2 turns activity did not occur: Safety/medical concerns  Assist level: Moderate Assistance - Patient - 50 - 74% Assistive device: No Device    Walk 150 feet  activity   Assist Walk 150 feet activity did not occur: Safety/medical concerns         Walk 10 feet on uneven surface  activity   Assist Walk 10 feet on uneven surfaces activity did not occur: Safety/medical concerns (fatigue, pain)         Wheelchair     Assist Is the patient using a wheelchair?: Yes Type of Wheelchair: Manual Wheelchair activity did not occur: Safety/medical concerns (pain, fatigue)  Wheelchair assist level: Minimal Assistance - Patient > 75% Max wheelchair distance: 50    Wheelchair 50 feet with 2 turns activity    Assist    Wheelchair 50 feet with 2 turns activity did not occur: Safety/medical concerns (pain, fatigue)   Assist Level: Minimal Assistance - Patient > 75%   Wheelchair 150 feet activity     Assist  Wheelchair 150 feet activity did not occur: Safety/medical concerns (pain, fatigue)   Assist Level: Maximal Assistance - Patient 25 - 49%   Blood pressure (!) 114/52, pulse (!) 42, temperature 97.8 F (36.6 C), temperature source Oral, resp. rate 18, height 5\' 8"  (1.727 m), weight 81.8 kg, SpO2 94%.  Medical Problem List and Plan: 1. Functional deficits secondary to CHF             -patient may not  shower, HD cath             -ELOS/Goals: 18-21d, pt  goal is home by Birthday on 10/29  -Continue CIR  -Est Discharge 01/18/23    2.  Antithrombotics: -DVT/anticoagulation:  Pharmaceutical: Eliquis             -antiplatelet therapy:    3. Pain Management: Tylenol as needed  -Will check to see if air mattress would be a possibility to help with his back pain and discomfort at night   4. Mood/Behavior/Sleep: LCSW to evaluate and provide emotional support             -antipsychotic agents: n/a   5. Neuropsych/cognition: This patient is  capable of making decisions on his  own behalf.   6. Skin/Wound Care: Routine skin care checks, Eucerin cream for stasis dermatitis   7. Fluids/Electrolytes/Nutrition: Strict Is and Os  and follow-up chemistries per nephrology             -renal diet with FR             -continue vitamin C, D3, Renavit   8: Hypertension: monitor TID and prn             -continue doxazosin 1 mg BID             -continue Lasix 80 mg daily             -continue hydralazine 25 mg TID  -BP has been somewhat labile, continue to  monitor trend.  Monitor for symptoms of orthostatic hypotension, particularly after hemodialysis.  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 hypoglycemia  -10/18 monitor response to medication change  CBG (last 3)  Recent Labs    01/07/23 2034 01/08/23 0613 01/08/23 1158  GLUCAP 110* 115* 246*       13: ESRD: HD on T/T/S via right internal jugular TDC  -Reviewed nephrology note-hemodialysis tomorrow   14: Chronic diastolic CHF: daily weight (dry weight ~220#)  EF is normal  -(meds as in #8) -fluid balance>>HD -10/18 weight appears decreased from prior days, continue to monitor trend  Filed Weights   01/07/23 0606 01/07/23 1452 01/07/23 1902  Weight: 91 kg 83.3 kg 81.8 kg      15: Atrial fibrillation: on Eliquis - bradycardia , not taking BBs monitor rate  -Avoid negative chronotropic  medications  Hr appears improved in upper 50s low 60s   16: COPD/acute hypoxic respiratory failure due to volume overload:  -continue Breo Ellipta             -Wean supplemental O2 as able   17: CAD: on statin, meds as in #8   18: Obesity: BMI = 30.17  -Dietary education   19: Anemia of chronic inflammation, cardiorenal disease ESRD: -continue Epogen 4000 units every TTS on hemodialysis days as per nephrology     20: Bilateral lower extremity lymphedema, chronic: -continue Lotrisone cream twice daily      LOS: 4 days A FACE TO FACE EVALUATION WAS PERFORMED  Fanny Dance 01/08/2023, 2:18 PM

## 2023-01-08 NOTE — Progress Notes (Signed)
Patient refused CHG bath per protocal. Patient educated about bath and increased infection risk.  Cletis Media, RN

## 2023-01-08 NOTE — Progress Notes (Addendum)
[facility] Progress Note   Subjective:   Patient seen and examined in room.  Eating lunch, tolerating well.  Reports being stuck in dialysis yesterday due to lack of transporters. Not happy about the situation.  Otherwise tolerating dialysis well.  Denies CP, SOB, abdominal pain and n/v/d.   Objective Vitals:   01/07/23 1902 01/07/23 2013 01/08/23 0606 01/08/23 1015  BP:  132/79 (!) 141/60 (!) 114/52  Pulse:  (!) 58 (!) 42   Resp:  16 18   Temp:  97.6 F (36.4 C) 97.8 F (36.6 C)   TempSrc:  Oral Oral   SpO2:  99% 94%   Weight: 81.8 kg     Height:       Physical Exam General:chronically ill appearing male in NAD Heart:RRR, no mrg Lungs:CTAB, nml WOB on RA Abdomen:soft, NTND Extremities:no LE edema, chronic skin changes Dialysis Access: [facility] R chest   Filed Weights   01/07/23 0606 01/07/23 1452 01/07/23 1902  Weight: 91 kg 83.3 kg 81.8 kg    Intake/Output Summary (Last 24 hours) at 01/08/2023 1304 Last data filed at 01/07/2023 1845 Gross per 24 hour  Intake 480 ml  Output 1500 ml  Net -1020 ml    Additional Objective Labs: Basic Metabolic Panel: Recent Labs  Lab 01/03/23 0432 01/04/23 0505 01/07/23 1452  NA 137 140 134*  K 3.7 4.1 4.0  CL 97* 98 96*  CO2 25 23 21*  GLUCOSE 80 66* 171*  BUN 43* 69* 81*  CREATININE 3.82* 5.71* 7.43*  CALCIUM 8.3* 8.4* 8.1*  PHOS 4.8* 7.2* 7.6*   Liver Function Tests: Recent Labs  Lab 01/03/23 0432 01/04/23 0505 01/07/23 1452  ALBUMIN 3.3* 3.1* 3.0*   CBC: Recent Labs  Lab 01/02/23 0522 01/04/23 0505 01/07/23 1452  WBC 9.7 10.2 10.1  NEUTROABS 7.1 7.3  --   HGB 10.9* 11.6* 11.7*  HCT 32.7* 34.7* 36.7*  MCV 89.8 89.4 93.4  PLT 199 222 211   Iron Studies:  Recent Labs    01/07/23 1452  IRON 61  TIBC 326   Lab Results  Component Value Date   INR 1.5 (H) 12/20/2022   INR 1.2 12/07/2022   INR 1.6 (H) 12/03/2022   Studies/Results: No results found.  Medications:   (feeding  supplement) PROSource Plus  30 mL Oral BID BM   apixaban  2.5 mg Oral BID   vitamin C  500 mg Oral BID   Chlorhexidine Gluconate Cloth  6 each Topical Q12H   Chlorhexidine Gluconate Cloth  6 each Topical Q0600   cholecalciferol  2,000 Units Oral Daily   clotrimazole-betamethasone   Topical BID   docusate sodium  100 mg Oral BID   fluticasone furoate-vilanterol  1 puff Inhalation Daily   furosemide  80 mg Oral Q M,W,F   hydrALAZINE  25 mg Oral TID   hydrocerin   Topical TID   insulin aspart  0-15 Units Subcutaneous TID AC & HS   insulin aspart  3 Units Subcutaneous TID WC   insulin glargine-yfgn  16 Units Subcutaneous Daily   isosorbide mononitrate  60 mg Oral Daily   melatonin  5 mg Oral QHS   multivitamin  1 tablet Oral QHS   neomycin-polymyxin b-dexamethasone  1 Application Right Eye TID   omega-3 acid ethyl esters  1 g Oral Daily   pantoprazole  40 mg Oral Daily   rosuvastatin  10 mg Oral Once per day on Monday Wednesday Friday   sevelamer carbonate  800  mg Oral TID WC    Background: Kyle Roberson is a 82 y.o. male with (newly declared during [facility] admit) ESRD, T2DM, HTN, CAD, HFpEF, A-fib (on Eliquis), and Hx SDH (01/2021) who was transferred from [facility] to [facility] for ongoing rehab.    Dialysis Orders: Has been accepted to [facility] on TTS per notes (will start after CIR d/c) Establishing orders.    Assessment/Plan:  Debility: Admitted to CIR for ongoing rehab. ESRD:  Continue HD per TTS schedule - HD tomorrow.  Using [facility]. Have not pursued permanent access at this time, may be hindrance to his therapy sessions to do inpatient. Hypertension/volume: BP variable, abdominal distention improving, continue to challenge EDW and lower as tolerated.  Anemia of ESRD: Hgb > 11.5 at this time, no ESA needs. Tsat 19%.  If Hgb starts to drop will order IV iron course.  Metabolic bone disease: Ca ok, Phos high. Does not appear that he was getting a binder - Renvela 1/meals started. May  need to increase dose if not improving.  Check PTH.  Nutrition: Alb low, continue renal diet + prot supps. T2DM HFpEF  Virgina Norfolk, PA-C [facility] 01/08/2023,1:04 PM  LOS: 4 days

## 2023-01-08 NOTE — Evaluation (Signed)
Recreational Therapy Assessment and Plan  Patient Details  Name: Kyle Roberson MRN: [id number] Date of Birth: [birth year] Today's Date: 01/08/2023  Rehab Potential:  Good ELOS:   d/c 10/28  Assessment Hospital Problem: Principal Problem:   Debility     Past Medical History:      Past Medical History:  Diagnosis Date   AKI (acute kidney injury) (HCC) 07/06/2018   CAD (coronary artery disease)      a. Remote PCI/stenting to LAD w PTCA Diagnoal. RCA 60%; b.  2005/2008 Cardiolites w/ reportedly mild ischemia in Diag territory-->Med rx.   Cellulitis of lower extremity 05/31/2019   Chronic heart failure with preserved ejection fraction (HFpEF) (HCC)      a. 04/2019 Echo: EF 55-60%, no rwma, nl RV fxn, RVSP 55.33mmHg. Mod dil LA. Mild-mod MR. Mod dil PA.   CKD (chronic kidney disease), stage IV (HCC)     Diabetes (HCC)     GERD (gastroesophageal reflux disease)     History of MI (myocardial infarction) 06/27/2014   HLD (hyperlipidemia)     HTN (hypertension)     PAH (pulmonary artery hypertension) (HCC)      a. 04/2019 Echo: RVSP 55.27mmHg.   Permanent atrial fibrillation (HCC)      a. CHA2DS2VASc = 5-->dose adjusted eliquis (age/creat).   Subdural hematoma (HCC)      a. 01/2021 in setting of fall s/p L frontotemporal craniotomy.        Past Surgical History:       Past Surgical History:  Procedure Laterality Date   BLEPHAROPLASTY       CARDIAC CATHETERIZATION       CORONARY STENT INTERVENTION       CRANIOTOMY Left 02/04/2021    Procedure: CRANIOTOMY FOR LEFT SUBDURAL  HEMATOMA EVACUATION;  Surgeon: Kyle Chris, MD;  Location: [facility] ORS;  Service: Neurosurgery;  Laterality: Left;   CYSTOSCOPY       DIALYSIS/PERMA CATHETER INSERTION N/A 12/14/2022    Procedure: DIALYSIS/PERMA CATHETER INSERTION;  Surgeon: Kyle Needy, MD;  Location: [facility] INVASIVE CV LAB;  Service: Cardiovascular;  Laterality: N/A;   DIALYSIS/PERMA CATHETER REPAIR N/A 12/16/2022    Procedure: DIALYSIS/PERMA  CATHETER REPAIR;  Surgeon: Kyle Needy, MD;  Location: [facility] INVASIVE CV LAB;  Service: Cardiovascular;  Laterality: N/A;   HERNIA REPAIR       RIGHT HEART CATH N/A 12/02/2022    Procedure: RIGHT HEART CATH;  Surgeon: Kyle Morale, MD;  [facility]: [facility] INVASIVE CV LAB;  Service: Cardiovascular;  Laterality: N/A;          Assessment & Plan Clinical Impression: Kyle Roberson is an 82 year old male who presented to the emergency department on 11/26/2022 to been evaluated by his nephrologist for volume overload. He was admitted to the hospitalist service for acute on chronic diastolic heart failure started on Lasix. 2D echo was performed on 9/06 that revealed a left ventricular ejection fraction of 55 to 60% with normal left ventricle function and normal right ventricle function. He had severely elevated pulmonary arterial systolic pressure. He was seen in consultation that day by Kyle Roberson with failure team. His past medical history is significant for chronic diastolic congestive heart failure, coronary artery disease status post remote PCI to the LAD and diagonal, chronic kidney disease stage IV, hypertension, hyperlipidemia, permanent atrial fibrillation, traumatic subdural hematoma status post craniotomy in November 2022. He is maintained on Eliquis 2.5 mg twice daily dosed appropriate for age and renal function. Nephrology was consulted on 9/07. Patient's cardiologist  Kyle Roberson was also consulted. His urine output significantly dropped on 9/11 and BUN and creatinine continued upward trend. He remained in rate controlled atrial fibrillation. Rate averaging 40 to 50 bpm with some overnight bradycardia 30s. He developed cardiorenal syndrome and cardiogenic shock. Converted to heparin infusion. He underwent placement of dialysis catheter initiation of CRRT. Palliative care consultation obtained. 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 Ultimately transition to intermittent hemodialysis and is now on a Tuesday, Thursday, Saturday schedule. Eliquis was resumed on 9/24. He underwent placement of tunneled hemodialysis catheter via the right IJ by Kyle Roberson on 9/25. Per cardiology, no role for PPM currently in light of need for HD and additional nidus for infection. Patient ambulated 72ft with Mod A initially, progressing to Min A with increased ambulation distance using rolling walker. The patient requires inpatient medicine and rehabilitation evaluations and services for ongoing dysfunction secondary to debility.    Pt presents with decreased activity tolerance, decreased functional mobility, decreased balance, decreased problem solving, decreased memory, feelings of stress Limiting pt's independence with leisure/community pursuits.  Met with pt today to discuss TR services.  Pt frustrated with events that occurred yesterday.  Active listening & emotional support provided.  Pt appreciative of this visit.  Full leisure assessment to be completed during the following session.    Plan  Min 1 TR session >20 minutes during LOS  Recommendations for other services: Neuropsych  Discharge Criteria: Patient will be discharged from TR if patient refuses treatment 3 consecutive times without medical reason.  If treatment goals not met, if there is a change in medical status, if patient makes no progress towards goals or if patient is discharged from hospital.  The above assessment, treatment plan, treatment alternatives and goals were discussed and mutually agreed upon: by patient  Kyle Roberson 01/08/2023, 12:20 PM

## 2023-01-08 NOTE — Progress Notes (Signed)
Physical Therapy Session Note  Patient Details  Name: Kyle Roberson MRN: [id number] Date of Birth: 1940-07-17  Today's Date: 01/08/2023 PT Individual Time: 1345-1430 PT Individual Time Calculation (min): 45 min   Short Term Goals: Week 1:  PT Short Term Goal 1 (Week 1): pt will perform bed mobility with min A consistantly PT Short Term Goal 2 (Week 1): pt will transfer sit<>stand with LRAD and CGA consistantly PT Short Term Goal 3 (Week 1): pt will ambulate 65ft with LRAD and min A  Skilled Therapeutic Interventions/Progress Updates:      Pt sitting in wheelchair to start. No reports of pain.  Transported to main rehab gym for time.  Instructed in sit<>stand to RW with CGA from w/c height. He ambulated 69ft + 11ft + 17ft during session (seated rest breaks) with CGA and RW. Cues for upright posture, increasing gait speed, and keeping body within walker frame.  Introduced Museum/gallery curator with 6" steps and 2 hand rails. He was able to navigate up/down x4 steps with minA while forward facing with step-to pattern. Quite difficult for him to push through his legs to propel himself upwards.   Completed 2x5 sit<>stands to RW from arm chair with supervision for safety - cues for hand placement and forward weight shifting.  Pt returned to his room and completed ambulatory transfer with CGA and RW to bed. Bed mobility required minA for RLE support. Left in bed with alarm on and all needs met at end of treatment.   Therapy Documentation Precautions:  Precautions Precautions: Fall Restrictions Weight Bearing Restrictions: No RLE Weight Bearing: Weight bearing as tolerated LLE Weight Bearing: Weight bearing as tolerated General:      Therapy/Group: Individual Therapy  Orrin Brigham 01/08/2023, 2:29 PM

## 2023-01-08 NOTE — Plan of Care (Signed)
  Problem: Consults Goal: RH GENERAL PATIENT EDUCATION Description: See Patient Education module for education specifics. Outcome: Progressing Goal: Diabetes Guidelines if Diabetic/Glucose > 140 Description: If diabetic or lab glucose is > 140 mg/dl - Initiate Diabetes/Hyperglycemia Guidelines & Document Interventions  Outcome: Progressing   Problem: RH BOWEL ELIMINATION Goal: RH STG MANAGE BOWEL WITH ASSISTANCE Description: STG Manage Bowel with min Assistance. Outcome: Progressing   Problem: RH BLADDER ELIMINATION Goal: RH STG MANAGE BLADDER WITH ASSISTANCE Description: STG Manage Bladder With min Assistance Outcome: Progressing   Problem: RH SKIN INTEGRITY Goal: RH STG MAINTAIN SKIN INTEGRITY WITH ASSISTANCE Description: STG Maintain Skin Integrity With min Assistance. Outcome: Progressing   Problem: RH SAFETY Goal: RH STG ADHERE TO SAFETY PRECAUTIONS W/ASSISTANCE/DEVICE Description: STG Adhere to Safety Precautions With min Assistance/Device. Outcome: Progressing   Problem: RH PAIN MANAGEMENT Goal: RH STG PAIN MANAGED AT OR BELOW PT'S PAIN GOAL Description: Pain level at or below 4/10 Outcome: Progressing   Problem: RH KNOWLEDGE DEFICIT GENERAL Goal: RH STG INCREASE KNOWLEDGE OF SELF CARE AFTER HOSPITALIZATION Description: Patient and family will be able to demonstrate understanding current medical state, medications used for symptom management, dietary modifications and fluid restriction independently using handouts and booklets provided. Outcome: Progressing   Problem: Education: Goal: Understanding of CV disease, CV risk reduction, and recovery process will improve Outcome: Progressing Goal: Individualized Educational Video(s) Outcome: Progressing   Problem: Activity: Goal: Ability to return to baseline activity level will improve Outcome: Progressing   Problem: Cardiovascular: Goal: Ability to achieve and maintain adequate cardiovascular perfusion will  improve Outcome: Progressing Goal: Vascular access site(s) Level 0-1 will be maintained Outcome: Progressing   Problem: Health Behavior/Discharge Planning: Goal: Ability to safely manage health-related needs after discharge will improve Outcome: Progressing   Problem: Education: Goal: Understanding of post-operative needs will improve Outcome: Progressing Goal: Individualized Educational Video(s) Outcome: Progressing   Problem: Clinical Measurements: Goal: Postoperative complications will be avoided or minimized Outcome: Progressing   Problem: Respiratory: Goal: Will regain and/or maintain adequate ventilation Outcome: Progressing   Problem: Education: Goal: Knowledge of General Education information will improve Description: Including pain rating scale, medication(s)/side effects and non-pharmacologic comfort measures Outcome: Progressing   Problem: Health Behavior/Discharge Planning: Goal: Ability to manage health-related needs will improve Outcome: Progressing   Problem: Clinical Measurements: Goal: Ability to maintain clinical measurements within normal limits will improve Outcome: Progressing Goal: Will remain free from infection Outcome: Progressing Goal: Diagnostic test results will improve Outcome: Progressing Goal: Respiratory complications will improve Outcome: Progressing Goal: Cardiovascular complication will be avoided Outcome: Progressing   Problem: Activity: Goal: Risk for activity intolerance will decrease Outcome: Progressing   Problem: Nutrition: Goal: Adequate nutrition will be maintained Outcome: Progressing   Problem: Coping: Goal: Level of anxiety will decrease Outcome: Progressing   Problem: Elimination: Goal: Will not experience complications related to bowel motility Outcome: Progressing Goal: Will not experience complications related to urinary retention Outcome: Progressing   Problem: Pain Managment: Goal: General experience of  comfort will improve Outcome: Progressing   Problem: Safety: Goal: Ability to remain free from injury will improve Outcome: Progressing   Problem: Skin Integrity: Goal: Risk for impaired skin integrity will decrease Outcome: Progressing

## 2023-01-08 NOTE — Progress Notes (Signed)
Occupational Therapy Session Note  Patient Details  Name: Kyle Roberson MRN: [id number] Date of Birth: 1940/04/09  Today's Date: 01/08/2023 OT Individual Time: 5366-4403 OT Individual Time Calculation (min): 75 min    Short Term Goals: Week 1:  OT Short Term Goal 1 (Week 1): Pt will stand up to 2 min for grooming sink side level OT Short Term Goal 2 (Week 1): Pt will complete LB self care with AE as needed with mod A OT Short Term Goal 3 (Week 1): Pt will complete toilet transfer with CGA using LRAD  Skilled Therapeutic Interventions/Progress Updates:  Pt seen for skilled OT session with focus on improving overall mobility and self help skills with adapted methods, AD, AE. Pt bed level upon OT arrival and eager for am self care routine. Pt moved from supine to sit with increased time and effort and min A supine to sit and to EOB using bed features and rails. Pt with improved LE edema but continues to present with severely dry and discolored LE's and feet. OT assisted with lower leg sponge bathing and lotion application at EOB and pt able to complete peri hygiene, upper legs seated level and OT assisted with buttocks and incontinence brief change with sit to stands with min A from elevated bed surface. Pt amb 15 ft to w/c set sink side with CGA and pt stood for face washing and hair combing 2.5 minutes with CGA. Seated UB sponge bathing with close S and pullover shirt with increased time and effort due to R shoulder ROM difficulty and CGA. Pt used reacher and shoe horn for LB dressing including Velcro tennis shoes with mod fading to max A. OT donned tennis socks due to skin integrity and edema. OT transported pt for time management to far ortho gym for B UE restorator use on [employer] w/c level. OT provided support to R shoulder for 10 revolutions forward and backward x 2 sets with rest in between at L1. Once back in room, OT set chair alarm, placed needs and nurse call button wihtin reach.     Pain: reports no pain   Therapy Documentation Precautions:  Precautions Precautions: Fall Restrictions Weight Bearing Restrictions: No RLE Weight Bearing: Weight bearing as tolerated LLE Weight Bearing: Weight bearing as tolerated  Therapy/Group: Individual Therapy  Vicenta Dunning 01/08/2023, 7:32 AM

## 2023-01-08 NOTE — Plan of Care (Signed)
CHL Tonsillectomy/Adenoidectomy, Postoperative PEDS care plan entered in error.

## 2023-01-08 NOTE — Progress Notes (Signed)
Occupational Therapy Session Note  Patient Details  Name: Kyle Roberson MRN: [id number] Date of Birth: 13-Jan-1941  Today's Date: 01/08/2023 OT Individual Time: 9147-8295 OT Individual Time Calculation (min): 63 min  and Today's Date: 01/08/2023 OT Missed Time: 10 Minutes Missed Time Reason: Other (comment) (Missed minutes due to delay in care)   Short Term Goals: Week 1:  OT Short Term Goal 1 (Week 1): Pt will stand up to 2 min for grooming sink side level OT Short Term Goal 2 (Week 1): Pt will complete LB self care with AE as needed with mod A OT Short Term Goal 3 (Week 1): Pt will complete toilet transfer with CGA using LRAD  Skilled Therapeutic Interventions/Progress Updates:  Pt received sitting in [facility] for skilled OT session with focus on general conditioning and dynamic standing balance. Pt agreeable to interventions, demonstrating overall pleasant mood. Pt with no reports of pain. OT offering intermediate rest breaks and positioning suggestions throughout session to address pain/fatigue and maximize participation/safety in session.   Pt dependent for WC transport from room>day room for time management. In day room, pt participates in series of dynamic balance activities, details below: Toe taps onto 1in step, activity downgraded from use of cone, pt requires CGA + RW, cuing provided for foot clearance. Pt completes 3x20 reps, seated rest-break in between. CGA-SUP for sit>stand, Min A for stand>sit to assist with slow descent onto WC.  Step overs 1/2 thick dowel bar for promotion of increased step length as a mean of reducing fall risk and management of household thresholds. Pt continues to require CGA + RW, cuing for foot clearance and positioning. Pt completes 3x10 reps, demo increased difficulty with clearance of L>R foot.   Pt ambulates back to room with CGA + RW, and cuing for safe RW management. Pt requires heavy Mod A to perform STS from low bed, once back in room while  therapist adjusted WC.   Pt remained sitting in [facility] with all immediate needs met at end of session. Pt continues to be appropriate for skilled OT intervention to promote further functional independence.   Therapy Documentation Precautions:  Precautions Precautions: Fall Restrictions Weight Bearing Restrictions: No RLE Weight Bearing: Weight bearing as tolerated LLE Weight Bearing: Weight bearing as tolerated   Therapy/Group: Individual Therapy  Lou Cal, OTR/L, MSOT  01/08/2023, 7:51 AM

## 2023-01-09 LAB — RENAL FUNCTION PANEL
Albumin: 2.9 g/dL — ABNORMAL LOW (ref 3.5–5.0)
Anion gap: 19 — ABNORMAL HIGH (ref 5–15)
BUN: 79 mg/dL — ABNORMAL HIGH (ref 8–23)
CO2: 22 mmol/L (ref 22–32)
Calcium: 8.5 mg/dL — ABNORMAL LOW (ref 8.9–10.3)
Chloride: 95 mmol/L — ABNORMAL LOW (ref 98–111)
Creatinine, Ser: 6.47 mg/dL — ABNORMAL HIGH (ref 0.61–1.24)
GFR, Estimated: 8 mL/min — ABNORMAL LOW (ref >60)
Glucose, Bld: 116 mg/dL — ABNORMAL HIGH (ref 70–99)
Phosphorus: 5.7 mg/dL — ABNORMAL HIGH (ref 2.5–4.6)
Potassium: 3.7 mmol/L (ref 3.5–5.1)
Sodium: 136 mmol/L (ref 135–145)

## 2023-01-09 LAB — CBC
HCT: 33.6 % — ABNORMAL LOW (ref 39.0–52.0)
Hemoglobin: 10.7 g/dL — ABNORMAL LOW (ref 13.0–17.0)
MCH: 29.1 pg (ref 26.0–34.0)
MCHC: 31.8 g/dL (ref 30.0–36.0)
MCV: 91.3 fL (ref 80.0–100.0)
Platelets: 192 10*3/uL (ref 150–400)
RBC: 3.68 MIL/uL — ABNORMAL LOW (ref 4.22–5.81)
RDW: 14.3 % (ref 11.5–15.5)
WBC: 10.3 10*3/uL (ref 4.0–10.5)
nRBC: 0 % (ref 0.0–0.2)

## 2023-01-09 LAB — GLUCOSE, CAPILLARY
Glucose-Capillary: 106 mg/dL — ABNORMAL HIGH (ref 70–99)
Glucose-Capillary: 81 mg/dL (ref 70–99)
Glucose-Capillary: 275 mg/dL — ABNORMAL HIGH (ref 70–99)
Glucose-Capillary: 114 mg/dL — ABNORMAL HIGH (ref 70–99)
Glucose-Capillary: 229 mg/dL — ABNORMAL HIGH (ref 70–99)

## 2023-01-09 MED ORDER — ALTEPLASE 2 MG IJ SOLR: 2.0000 mg | Freq: Once | INTRAMUSCULAR | Status: DC | PRN

## 2023-01-09 MED ORDER — HYDRALAZINE HCL 10 MG PO TABS
10.0000 mg | ORAL_TABLET | Freq: Three times a day (TID) | ORAL | Status: DC
  Administered 2023-01-09 – 2023-01-10 (×2): 10 mg via ORAL
  Filled 2023-01-09 (×5): qty 1

## 2023-01-09 MED ORDER — ANTICOAGULANT SODIUM CITRATE 4% (200MG/5ML) IV SOLN
5.0000 mL | Status: DC | PRN
  Filled 2023-01-09: qty 5

## 2023-01-09 MED ORDER — HEPARIN SODIUM (PORCINE) 1000 UNIT/ML DIALYSIS
4000.0000 [IU] | INTRAMUSCULAR | Status: DC | PRN
  Administered 2023-01-09 (×2): 4000 [IU] via INTRAVENOUS_CENTRAL
  Filled 2023-01-09: qty 4

## 2023-01-09 MED ORDER — HEPARIN SODIUM (PORCINE) 1000 UNIT/ML DIALYSIS
1000.0000 [IU] | INTRAMUSCULAR | Status: DC | PRN
  Filled 2023-01-09: qty 1

## 2023-01-09 MED ORDER — LIDOCAINE HCL (PF) 1 % IJ SOLN: 5.0000 mL | INTRAMUSCULAR | Status: DC | PRN

## 2023-01-09 MED ORDER — PENTAFLUOROPROP-TETRAFLUOROETH EX AERO: 1.0000 | INHALATION_SPRAY | CUTANEOUS | Status: DC | PRN

## 2023-01-09 MED ORDER — INSULIN GLARGINE-YFGN 100 UNIT/ML ~~LOC~~ SOLN
14.0000 [IU] | Freq: Every day | SUBCUTANEOUS | Status: DC
  Administered 2023-01-10 – 2023-01-12 (×3): 14 [IU] via SUBCUTANEOUS
  Filled 2023-01-09 (×3): qty 0.14

## 2023-01-09 MED ORDER — LIDOCAINE-PRILOCAINE 2.5-2.5 % EX CREA: 1.0000 | TOPICAL_CREAM | CUTANEOUS | Status: DC | PRN

## 2023-01-09 NOTE — Progress Notes (Signed)
PROGRESS NOTE   Subjective/Complaints: Chart review indicates incident where patient became upset with nursing on inquiring about possible hypoglycemic symptoms this morning.  This afternoon he reports he is feeling okay, wishes he could have an air mattress because this is more comfortable.  ROS: Patient denies fever, chills, headache, shortness of breath, chest pain, abdominal pain, nausea, vomiting + Generalized weakness + Back pain at night-reports due to mattress being uncomfortable  Objective:   No results found. Recent Labs    01/07/23 1452 01/09/23 1345  WBC 10.1 10.3  HGB 11.7* 10.7*  HCT 36.7* 33.6*  PLT 211 192    Recent Labs    01/07/23 1452 01/09/23 1345  NA 134* 136  K 4.0 3.7  CL 96* 95*  CO2 21* 22  GLUCOSE 171* 116*  BUN 81* 79*  CREATININE 7.43* 6.47*  CALCIUM 8.1* 8.5*     Intake/Output Summary (Last 24 hours) at 01/09/2023 1826 Last data filed at 01/09/2023 1730 Gross per 24 hour  Intake 534 ml  Output 2000 ml  Net -1466 ml        Physical Exam: Vital Signs Blood pressure (!) 106/57, pulse 64, temperature 97.6 F (36.4 C), resp. rate 16, height 5\' 8"  (1.727 m), weight 85.6 kg, SpO2 100%. Body mass index is 28.69 kg/m.  General: No acute distress, lying in bed Mood and affect are appropriate Head and neck: NCAT, bilateral eye ectropion Heart: RRR, Lungs: CTAB, normal work of breathing on room air Abdomen: Positive bowel sounds, soft nontender to palpation, nondistended Extremities: Trace lower extremity edema Skin: No evidence of breakdown, severe stasis dermatitis changes  TDC R chest wall  Neurologic: Alert and awake, follows commands, cranial nerves II through XII grossly intact Moving all 4 extremities to gravity and resistance Sensory exam normal sensation to light touch bilateral upper and lower extremities No hypertonia noted Musculoskeletal: Full range of motion in  all 4 extremities. No joint swelling  Assessment/Plan: 1. Functional deficits which require 3+ hours per day of interdisciplinary therapy in a comprehensive inpatient rehab setting. Physiatrist is providing close team supervision and 24 hour management of active medical problems listed below. Physiatrist and rehab team continue to assess barriers to discharge/monitor patient progress toward functional and medical goals  Care Tool:  Bathing    Body parts bathed by patient: Right arm, Left arm, Chest, Abdomen, Face, Right upper leg, Right lower leg   Body parts bathed by helper: Left upper leg, Left lower leg, Buttocks     Bathing assist Assist Level: Maximal Assistance - Patient 24 - 49%     Upper Body Dressing/Undressing Upper body dressing   What is the patient wearing?: Hospital gown only    Upper body assist Assist Level: Minimal Assistance - Patient > 75%    Lower Body Dressing/Undressing Lower body dressing      What is the patient wearing?: Underwear/pull up     Lower body assist Assist for lower body dressing: Moderate Assistance - Patient 50 - 74%     Toileting Toileting    Toileting assist Assist for toileting: Maximal Assistance - Patient 25 - 49%     Transfers Chair/bed transfer  Transfers  assist     Chair/bed transfer assist level: Minimal Assistance - Patient > 75%     Locomotion Ambulation   Ambulation assist   Ambulation activity did not occur: Safety/medical concerns (fatigue, pain)  Assist level: Moderate Assistance - Patient 50 - 74% Assistive device: No Device Max distance: 50 feet   Walk 10 feet activity   Assist  Walk 10 feet activity did not occur: Safety/medical concerns (fatigue, pain)  Assist level: Moderate Assistance - Patient - 50 - 74% Assistive device: No Device   Walk 50 feet activity   Assist Walk 50 feet with 2 turns activity did not occur: Safety/medical concerns  Assist level: Moderate Assistance - Patient  - 50 - 74% Assistive device: No Device    Walk 150 feet activity   Assist Walk 150 feet activity did not occur: Safety/medical concerns         Walk 10 feet on uneven surface  activity   Assist Walk 10 feet on uneven surfaces activity did not occur: Safety/medical concerns (fatigue, pain)         Wheelchair     Assist Is the patient using a wheelchair?: Yes Type of Wheelchair: Manual Wheelchair activity did not occur: Safety/medical concerns (pain, fatigue)  Wheelchair assist level: Minimal Assistance - Patient > 75% Max wheelchair distance: 50    Wheelchair 50 feet with 2 turns activity    Assist    Wheelchair 50 feet with 2 turns activity did not occur: Safety/medical concerns (pain, fatigue)   Assist Level: Minimal Assistance - Patient > 75%   Wheelchair 150 feet activity     Assist  Wheelchair 150 feet activity did not occur: Safety/medical concerns (pain, fatigue)   Assist Level: Maximal Assistance - Patient 25 - 49%   Blood pressure (!) 106/57, pulse 64, temperature 97.6 F (36.4 C), resp. rate 16, height 5\' 8"  (1.727 m), weight 85.6 kg, SpO2 100%.  Medical Problem List and Plan: 1. Functional deficits secondary to CHF             -patient may not  shower, HD cath             -ELOS/Goals: 18-21d, pt  goal is home by Birthday on 10/29  -Continue CIR  -Est Discharge 01/18/23    2.  Antithrombotics: -DVT/anticoagulation:  Pharmaceutical: Eliquis             -antiplatelet therapy:    3. Pain Management: Tylenol as needed  -Will check to see if air mattress would be a possibility to help with his back pain and discomfort at night   4. Mood/Behavior/Sleep: LCSW to evaluate and provide emotional support             -antipsychotic agents: n/a   5. Neuropsych/cognition: This patient is  capable of making decisions on his  own behalf.   6. Skin/Wound Care: Routine skin care checks, Eucerin cream for stasis dermatitis   7.  Fluids/Electrolytes/Nutrition: Strict Is and Os and follow-up chemistries per nephrology             -renal diet with FR             -continue vitamin C, D3, Renavit   8: Hypertension: monitor TID and prn             -continue doxazosin 1 mg BID-continue to hold             -continue Lasix 80 mg daily             -  continue hydralazine 25 mg TID  -BP has been somewhat labile, continue to monitor trend.  Monitor for symptoms of orthostatic hypotension, particularly after hemodialysis.  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 hypoglycemia  -10/19 patient and wife were very concerned about hypoglycemia this morning, will dose increase Semglee to 14 units, consider increase mealtime insulin  CBG (last 3)  Recent Labs    01/09/23 0621 01/09/23 1143 01/09/23 1813  GLUCAP 106* 275* 114*       13: ESRD: HD on T/T/S via right internal jugular TDC  -Reviewed nephrology note-hemodialysis today completed   14: Chronic diastolic CHF: daily weight (dry weight ~220#)  EF is normal  -(meds as in #8) -fluid balance>>HD -10/19 weight trending down  Filed Weights   01/09/23 0725 01/09/23 1332 01/09/23 1730   Weight: 90.3 kg 87.6 kg 85.6 kg      15: Atrial fibrillation: on Eliquis - bradycardia , not taking BBs monitor rate  -Avoid negative chronotropic medications  Heart rate stable continue to monitor   16: COPD/acute hypoxic respiratory failure due to volume overload:  -continue Breo Ellipta             -Wean supplemental O2 as able   17: CAD: on statin, meds as in #8   18: Obesity: BMI = 30.17  -Dietary education   19: Anemia of chronic inflammation, cardiorenal disease ESRD: -continue Epogen 4000 units every TTS on hemodialysis days as per nephrology     20: Bilateral lower extremity lymphedema, chronic: -continue Lotrisone cream twice daily      LOS: 5 days A FACE TO FACE EVALUATION WAS PERFORMED  Fanny Dance 01/09/2023, 6:26 PM

## 2023-01-09 NOTE — Plan of Care (Signed)
  Problem: Consults Goal: RH GENERAL PATIENT EDUCATION Description: See Patient Education module for education specifics. Outcome: Progressing Goal: Diabetes Guidelines if Diabetic/Glucose > 140 Description: If diabetic or lab glucose is > 140 mg/dl - Initiate Diabetes/Hyperglycemia Guidelines & Document Interventions  Outcome: Progressing   Problem: RH BOWEL ELIMINATION Goal: RH STG MANAGE BOWEL WITH ASSISTANCE Description: STG Manage Bowel with min Assistance. Outcome: Progressing   Problem: RH BLADDER ELIMINATION Goal: RH STG MANAGE BLADDER WITH ASSISTANCE Description: STG Manage Bladder With min Assistance Outcome: Progressing   Problem: RH SKIN INTEGRITY Goal: RH STG MAINTAIN SKIN INTEGRITY WITH ASSISTANCE Description: STG Maintain Skin Integrity With min Assistance. Outcome: Progressing   Problem: RH SAFETY Goal: RH STG ADHERE TO SAFETY PRECAUTIONS W/ASSISTANCE/DEVICE Description: STG Adhere to Safety Precautions With min Assistance/Device. Outcome: Progressing   Problem: RH PAIN MANAGEMENT Goal: RH STG PAIN MANAGED AT OR BELOW PT'S PAIN GOAL Description: Pain level at or below 4/10 Outcome: Progressing   Problem: RH KNOWLEDGE DEFICIT GENERAL Goal: RH STG INCREASE KNOWLEDGE OF SELF CARE AFTER HOSPITALIZATION Description: Patient and family will be able to demonstrate understanding current medical state, medications used for symptom management, dietary modifications and fluid restriction independently using handouts and booklets provided. Outcome: Progressing   Problem: Education: Goal: Understanding of CV disease, CV risk reduction, and recovery process will improve Outcome: Progressing Goal: Individualized Educational Video(s) Outcome: Progressing   Problem: Activity: Goal: Ability to return to baseline activity level will improve Outcome: Progressing   Problem: Cardiovascular: Goal: Ability to achieve and maintain adequate cardiovascular perfusion will  improve Outcome: Progressing Goal: Vascular access site(s) Level 0-1 will be maintained Outcome: Progressing   Problem: Health Behavior/Discharge Planning: Goal: Ability to safely manage health-related needs after discharge will improve Outcome: Progressing   Problem: Education: Goal: Knowledge of General Education information will improve Description: Including pain rating scale, medication(s)/side effects and non-pharmacologic comfort measures Outcome: Progressing   Problem: Health Behavior/Discharge Planning: Goal: Ability to manage health-related needs will improve Outcome: Progressing   Problem: Clinical Measurements: Goal: Ability to maintain clinical measurements within normal limits will improve Outcome: Progressing Goal: Will remain free from infection Outcome: Progressing Goal: Diagnostic test results will improve Outcome: Progressing Goal: Respiratory complications will improve Outcome: Not Progressing Note: Patient has continues to refuse inhaler.  Goal: Cardiovascular complication will be avoided Outcome: Progressing   Problem: Activity: Goal: Risk for activity intolerance will decrease Outcome: Progressing   Problem: Nutrition: Goal: Adequate nutrition will be maintained Outcome: Not Progressing Note: Patient does not understand reason for renal diet and fluid restriction. Patient was educated as to diet modifications. Patient stated that no one should tell him what he can and can not eat.   Problem: Coping: Goal: Level of anxiety will decrease Outcome: Progressing   Problem: Elimination: Goal: Will not experience complications related to bowel motility Outcome: Progressing Goal: Will not experience complications related to urinary retention Outcome: Progressing   Problem: Pain Managment: Goal: General experience of comfort will improve Outcome: Progressing   Problem: Safety: Goal: Ability to remain free from injury will improve Outcome:  Progressing   Problem: Skin Integrity: Goal: Risk for impaired skin integrity will decrease Outcome: Progressing

## 2023-01-09 NOTE — Progress Notes (Signed)
[facility] Progress Note   Subjective:   Patient seen and examined in room with wife present.  Patient upset about disagreement with overnight nurse during hypoglycemic event.  States he becomes symptomatic when blood sugar drops below 90.  Wife requesting decreased insulin dose prior to bedtime.   Otherwise no complaints.  Denies CP, SOB, abdominal pain and n/v/d.   Objective Vitals:   01/08/23 2022 01/09/23 0545 01/09/23 0725 01/09/23 0805  BP: (!) 117/57 128/72  (!) 128/47  Pulse: 62 (!) 59    Resp: 18 17    Temp: 98.8 F (37.1 C) 98.8 F (37.1 C)    TempSrc:  Oral    SpO2: 100% 98%    Weight:   90.3 kg   Height:       Physical Exam General:chronically ill appearing male in NAD Heart:RRR, no mrg Lungs:initial crackles in bases, cleared with increased inspiration Abdomen:soft, NTND Extremities:trace LE edema, chronic skin changes Dialysis Access: [facility]   Filed Weights   01/07/23 1902 01/08/23 0732 01/09/23 0725  Weight: 81.8 kg 90.1 kg 90.3 kg    Intake/Output Summary (Last 24 hours) at 01/09/2023 1139 Last data filed at 01/09/2023 0745 Gross per 24 hour  Intake 652 ml  Output --  Net 652 ml    Additional Objective Labs: Basic Metabolic Panel: Recent Labs  Lab 01/03/23 0432 01/04/23 0505 01/07/23 1452  NA 137 140 134*  K 3.7 4.1 4.0  CL 97* 98 96*  CO2 25 23 21*  GLUCOSE 80 66* 171*  BUN 43* 69* 81*  CREATININE 3.82* 5.71* 7.43*  CALCIUM 8.3* 8.4* 8.1*  PHOS 4.8* 7.2* 7.6*   Liver Function Tests: Recent Labs  Lab 01/03/23 0432 01/04/23 0505 01/07/23 1452  ALBUMIN 3.3* 3.1* 3.0*   CBC: Recent Labs  Lab 01/04/23 0505 01/07/23 1452  WBC 10.2 10.1  NEUTROABS 7.3  --   HGB 11.6* 11.7*  HCT 34.7* 36.7*  MCV 89.4 93.4  PLT 222 211   CBG: Recent Labs  Lab 01/08/23 1158 01/08/23 1629 01/08/23 2128 01/09/23 0537 01/09/23 0621  GLUCAP 246* 92 107* 81 106*   Iron Studies:  Recent Labs    01/07/23 1452  IRON 61  TIBC  326   Lab Results  Component Value Date   INR 1.5 (H) 12/20/2022   INR 1.2 12/07/2022   INR 1.6 (H) 12/03/2022   Studies/Results: No results found.  Medications:  anticoagulant sodium citrate      (feeding supplement) PROSource Plus  30 mL Oral BID BM   apixaban  2.5 mg Oral BID   vitamin C  500 mg Oral BID   Chlorhexidine Gluconate Cloth  6 each Topical Q12H   cholecalciferol  2,000 Units Oral Daily   clotrimazole-betamethasone   Topical BID   docusate sodium  100 mg Oral BID   fluticasone furoate-vilanterol  1 puff Inhalation Daily   furosemide  80 mg Oral Q M,W,F   hydrALAZINE  25 mg Oral TID   hydrocerin   Topical TID   insulin aspart  0-15 Units Subcutaneous TID AC & HS   insulin aspart  3 Units Subcutaneous TID WC   insulin glargine-yfgn  16 Units Subcutaneous Daily   isosorbide mononitrate  60 mg Oral Daily   melatonin  5 mg Oral QHS   multivitamin  1 tablet Oral QHS   neomycin-polymyxin b-dexamethasone  1 Application Right Eye TID   omega-3 acid ethyl esters  1 g Oral Daily   pantoprazole  40 mg Oral Daily   rosuvastatin  10 mg Oral Once per day on Monday Wednesday Friday   sevelamer carbonate  1,600 mg Oral TID WC    Dialysis Orders: Daxtyn Glowinski is a 82 y.o. 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    Dialysis Orders: Has been accepted to [facility] on TTS per notes (will start after CIR d/c) Establishing orders.    Assessment/Plan: Debility: Admitted to CIR for ongoing rehab. ESRD:  Continue HD per TTS schedule - HD today  Using [facility]. Have not pursued permanent access at this time, may be hindrance to his therapy sessions to do inpatient. Hypertension/volume: BP variable, abdominal distention improving, continue UF as tolerated.  Establishing dry weight.  Reports standing for weights in HD. Anemia of ESRD: Hgb > 11.5 at  this time, no ESA needs. Tsat 19%.  If Hgb starts to drop will order IV iron course.  Metabolic bone disease: Ca ok, last Phos high. Renvela increased to 2 AC yesterday.  Check PTH.  Nutrition: Alb low, continue renal diet + prot supps. T2DM - on insulin, management per PMD. HFpEF  Virgina Norfolk, PA-C [facility] 01/09/2023,11:39 AM  LOS: 5 days

## 2023-01-09 NOTE — Plan of Care (Addendum)
Patients wife called the nurses station because patient called her. He felt like his blood sugar was low. Nurse and tech went to patients room to assess patient and check CBG. Patient began yelling and screaming at this nurse because he was asked what hypoglycemic symptoms did he have. Patient was very rude and made this nurse leave his room, and did not want this nurse to provide care for him any longer. CN was notified. CN will now be providing care for this patient until the end of shift. Patients CBG was 81.  Problem: Consults Goal: RH GENERAL PATIENT EDUCATION Description: See Patient Education module for education specifics. Outcome: Progressing Goal: Diabetes Guidelines if Diabetic/Glucose > 140 Description: If diabetic or lab glucose is > 140 mg/dl - Initiate Diabetes/Hyperglycemia Guidelines & Document Interventions  Outcome: Progressing   Problem: Activity: Goal: Risk for activity intolerance will decrease Outcome: Progressing

## 2023-01-09 NOTE — Progress Notes (Signed)
   01/09/23 1730  Vitals  Temp (!) 97.5 F (36.4 C)  Pulse Rate 61  Resp (!) 21  BP (!) 118/58  SpO2 100 %  O2 Device Room Air  Weight 85.6 kg  Type of Weight Post-Dialysis  Oxygen Therapy  Patient Activity (if Appropriate) In bed  Pulse Oximetry Type Continuous  Oximetry Probe Site Changed No  Post Treatment  Dialyzer Clearance Lightly streaked  Hemodialysis Intake (mL) 0 mL  Liters Processed 84  Fluid Removed (mL) 2000 mL  Tolerated HD Treatment Yes   Received patient in bed to unit.  Alert and oriented.  Informed consent signed and in chart.   TX duration:3.5  Patient tolerated well.  Transported back to the room  Alert, without acute distress.  Hand-off given to patient's nurse.   Access used: [facility] Access issues: no complications  Total UF removed: 2000 Medication(s) given: none   [facility]

## 2023-01-09 NOTE — Progress Notes (Addendum)
Patient refused colace and melatonin this evening.  Patient reports 10 out of 10 chronic back pain. Patient requests to sleep in recliner stating bed is so uncomfortable-reports requesting air mattress but was told he did not qualify?. Patient readjusted multiple times in recliner for best comfort. Pain medication offered but patient continues to refuse.   0055-Patient still unable to sleep. Agreeable to go back to bed. Patient assisted back to bed and repositioned with pillow support. Call bell left within reach.  1610 Patient asleep

## 2023-01-09 NOTE — Progress Notes (Signed)
Talked to pt's wife, [name], about the incident that happened with the pt and his nurse and she stated that when the pt's blood sugar gets below 90, he starts to have symptoms. This nurse informed the wife that from my knowledge, the nurse was asking the pt what were the symptoms he was having so that she can treat accordingly, but he started yelling at her in frustration. The wife stated that when a person shows authority over him, he doesn't like it. She said the conversation seemed to go left when that happened and after that, the nurse and pt was yelling at one another. (The wife was on the phone during the interaction) From the nurse's perspective, she was not yelling, but had a higher tone than before due to pt disrespecting her, yelling, and kicking her out the room. The pt's wife wants to know if there can be an order that states that pt's blood sugar shouldn't go below 90 due to him having a hypoglycemic episode. I stated that the MD can place that order in as a nurse communication order. I also stated that if an order can not be placed in, we could still give him crackers or juice to increase his sugar per his request. She is aware that our protocol states to react when a pt's blood sugar is below 70. The conversation with the pt and the pt's wife ended with them being understanding and thankful for this nurse's help. This nurse decided to take over his care the remainder of the shift to avoid conflict. The pt's wife stated that she would be up here soon and was appreciative that the issue was resolved.

## 2023-01-10 LAB — GLUCOSE, CAPILLARY
Glucose-Capillary: 103 mg/dL — ABNORMAL HIGH (ref 70–99)
Glucose-Capillary: 273 mg/dL — ABNORMAL HIGH (ref 70–99)
Glucose-Capillary: 93 mg/dL (ref 70–99)
Glucose-Capillary: 71 mg/dL (ref 70–99)

## 2023-01-10 MED ORDER — CALCIUM CARBONATE ANTACID 500 MG PO CHEW
400.0000 mg | CHEWABLE_TABLET | Freq: Two times a day (BID) | ORAL | Status: DC | PRN
  Administered 2023-01-10 – 2023-01-12 (×2): 400 mg via ORAL
  Filled 2023-01-10 (×3): qty 2

## 2023-01-10 NOTE — Plan of Care (Signed)
  Problem: Consults Goal: RH GENERAL PATIENT EDUCATION Description: See Patient Education module for education specifics. Outcome: Progressing Goal: Diabetes Guidelines if Diabetic/Glucose > 140 Description: If diabetic or lab glucose is > 140 mg/dl - Initiate Diabetes/Hyperglycemia Guidelines & Document Interventions  Outcome: Progressing   Problem: RH BOWEL ELIMINATION Goal: RH STG MANAGE BOWEL WITH ASSISTANCE Description: STG Manage Bowel with min Assistance. Outcome: Progressing   Problem: RH BLADDER ELIMINATION Goal: RH STG MANAGE BLADDER WITH ASSISTANCE Description: STG Manage Bladder With min Assistance Outcome: Progressing   Problem: RH SKIN INTEGRITY Goal: RH STG MAINTAIN SKIN INTEGRITY WITH ASSISTANCE Description: STG Maintain Skin Integrity With min Assistance. Outcome: Progressing   Problem: RH SAFETY Goal: RH STG ADHERE TO SAFETY PRECAUTIONS W/ASSISTANCE/DEVICE Description: STG Adhere to Safety Precautions With min Assistance/Device. Outcome: Progressing   Problem: RH PAIN MANAGEMENT Goal: RH STG PAIN MANAGED AT OR BELOW PT'S PAIN GOAL Description: Pain level at or below 4/10 Outcome: Progressing   Problem: RH KNOWLEDGE DEFICIT GENERAL Goal: RH STG INCREASE KNOWLEDGE OF SELF CARE AFTER HOSPITALIZATION Description: Patient and family will be able to demonstrate understanding current medical state, medications used for symptom management, dietary modifications and fluid restriction independently using handouts and booklets provided. Outcome: Progressing   Problem: Education: Goal: Understanding of CV disease, CV risk reduction, and recovery process will improve Outcome: Progressing Goal: Individualized Educational Video(s) Outcome: Progressing   Problem: Activity: Goal: Ability to return to baseline activity level will improve Outcome: Progressing   Problem: Cardiovascular: Goal: Ability to achieve and maintain adequate cardiovascular perfusion will  improve Outcome: Progressing Goal: Vascular access site(s) Level 0-1 will be maintained Outcome: Progressing   Problem: Health Behavior/Discharge Planning: Goal: Ability to safely manage health-related needs after discharge will improve Outcome: Progressing   Problem: Education: Goal: Knowledge of General Education information will improve Description: Including pain rating scale, medication(s)/side effects and non-pharmacologic comfort measures Outcome: Progressing   Problem: Health Behavior/Discharge Planning: Goal: Ability to manage health-related needs will improve Outcome: Progressing   Problem: Clinical Measurements: Goal: Ability to maintain clinical measurements within normal limits will improve Outcome: Progressing Goal: Will remain free from infection Outcome: Progressing Goal: Diagnostic test results will improve Outcome: Progressing Goal: Respiratory complications will improve Outcome: Progressing Goal: Cardiovascular complication will be avoided Outcome: Progressing   Problem: Activity: Goal: Risk for activity intolerance will decrease Outcome: Progressing   Problem: Nutrition: Goal: Adequate nutrition will be maintained Outcome: Progressing   Problem: Coping: Goal: Level of anxiety will decrease Outcome: Progressing   Problem: Elimination: Goal: Will not experience complications related to bowel motility Outcome: Progressing Goal: Will not experience complications related to urinary retention Outcome: Progressing   Problem: Pain Managment: Goal: General experience of comfort will improve Outcome: Progressing   Problem: Safety: Goal: Ability to remain free from injury will improve Outcome: Progressing   Problem: Skin Integrity: Goal: Risk for impaired skin integrity will decrease Outcome: Progressing

## 2023-01-10 NOTE — Progress Notes (Addendum)
Occupational Therapy Session Note  Patient Details  Name: Kyle Roberson MRN: [id number] Date of Birth: 1941-02-05  Today's Date: 01/10/2023 OT Individual Time: 1120-1201 OT Individual Time Calculation (min): 41 min   Today's Date: 01/10/2023 OT Individual Time: 6213-0865 OT Individual Time Calculation (min): 40 min   Short Term Goals: Week 1:  OT Short Term Goal 1 (Week 1): Pt will stand up to 2 min for grooming sink side level OT Short Term Goal 2 (Week 1): Pt will complete LB self care with AE as needed with mod A OT Short Term Goal 3 (Week 1): Pt will complete toilet transfer with CGA using LRAD  Skilled Therapeutic Interventions/Progress Updates:  Session 1: Pt received sitting in recliner for skilled OT session with focus on activity tolerance activities and functional transfers. Pt agreeable to interventions, demonstrating overall pleasant mood. Pt with no reports of pain. OT offering intermediate rest breaks and positioning suggestions throughout session to address pain/fatigue and maximize participation/safety in session.   Pt performs stand-step transfer from recliner<>WC with CGA + RW. Pt dependent for room<>ADL apartment transport for energy conservation. In ADL apartment, pt participates in IADL activity with focus placed on dynamic standing balance and activity tolerance. Pt performs multiple STS from [facility] level and room-level ambulation with close supervision + RW, cuing provided for safe hand placement to improve descent onto WC. Pt and OT review home bathroom setup, pt performs walk-in shower transfer with TTB for decreased fall-risk, pt requires heavy Mod A to ascend from TTB, plan to continue working towards standing transfer as patient does not want to purchase TTB since he owns a shower seat.    Pt remained sitting in recliner with all immediate needs met at end of session. Pt continues to be appropriate for skilled OT intervention to promote further functional  independence.   Session 2:  Pt received sitting in recliner for skilled OT session with focus on general conditioning for increased activity tolerance during ADL participaiton. Pt agreeable to interventions, demonstrating overall pleasant mood. Pt with no reports of pain, OT offering intermediate rest breaks and positioning suggestions throughout session to address pain/fatigue and maximize participation/safety in session.   Pt performs STS from recliner with close supervision + RW, ambulating from room>day room with CGA + RW. Pt not receptive to cuing for safe RW management, stating "I hear what you're saying but I don't do it like that, it's uncomfortable," in regards to distance at which he pushes the RW. In day room, pt participates in 3x2 min cycles of Nustep modality for general conditioning. VSS. Extended rest-breaks provided for SOB management.   Pt requires min A to reach stance from [facility] to perform stand-step back to recliner.   Pt remained sitting in recliner with all immediate needs met at end of session. Pt continues to be appropriate for skilled OT intervention to promote further functional independence.   Therapy Documentation Precautions:  Precautions Precautions: Fall Restrictions Weight Bearing Restrictions: No RLE Weight Bearing: Weight bearing as tolerated LLE Weight Bearing: Weight bearing as tolerated   Therapy/Group: Individual Therapy  Lou Cal, OTR/L, MSOT  01/10/2023, 11:31 AM

## 2023-01-10 NOTE — Progress Notes (Signed)
[facility] Progress Note   Subjective:   Patient seen and examined at bedside. Reports diarrhea overnight and abdominal pain this AM. Did not sleep well. Requesting air mattress - PMD checking into if he can get one.  Denies CP, SOB, and n/v.  Tolerated dialysis well yesterday.   Objective Vitals:   01/09/23 1941 01/10/23 0500 01/10/23 0547 01/10/23 0846  BP: (!) 102/57  (!) 105/54 (!) 95/48  Pulse: 65  (!) 52   Resp: 17  17   Temp: (!) 97.5 F (36.4 C)  97.9 F (36.6 C)   TempSrc: Oral     SpO2: 99%  93%   Weight:  84.8 kg    Height:       Physical Exam General:chronically ill appearing male in NAD Heart:RRR, no mrg Lungs:CTAB anteriorly Abdomen:soft, ND Extremities:chronic skin changes, no LE edema Dialysis Access: [facility]   Filed Weights   01/09/23 1332 01/09/23 1730 01/10/23 0500  Weight: 87.6 kg 85.6 kg 84.8 kg    Intake/Output Summary (Last 24 hours) at 01/10/2023 0852 Last data filed at 01/09/2023 1730 Gross per 24 hour  Intake 118 ml  Output 2000 ml  Net -1882 ml    Additional Objective Labs: Basic Metabolic Panel: Recent Labs  Lab 01/04/23 0505 01/07/23 1452 01/09/23 1345  NA 140 134* 136  K 4.1 4.0 3.7  CL 98 96* 95*  CO2 23 21* 22  GLUCOSE 66* 171* 116*  BUN 69* 81* 79*  CREATININE 5.71* 7.43* 6.47*  CALCIUM 8.4* 8.1* 8.5*  PHOS 7.2* 7.6* 5.7*   Liver Function Tests: Recent Labs  Lab 01/04/23 0505 01/07/23 1452 01/09/23 1345  ALBUMIN 3.1* 3.0* 2.9*   CBC: Recent Labs  Lab 01/04/23 0505 01/07/23 1452 01/09/23 1345  WBC 10.2 10.1 10.3  NEUTROABS 7.3  --   --   HGB 11.6* 11.7* 10.7*  HCT 34.7* 36.7* 33.6*  MCV 89.4 93.4 91.3  PLT 222 211 192   CBG: Recent Labs  Lab 01/09/23 0621 01/09/23 1143 01/09/23 1813 01/09/23 2152 01/10/23 0622  GLUCAP 106* 275* 114* 229* 103*   Iron Studies:  Recent Labs    01/07/23 1452  IRON 61  TIBC 326   Lab Results  Component Value Date   INR 1.5 (H) 12/20/2022   INR  1.2 12/07/2022   INR 1.6 (H) 12/03/2022   Studies/Results: No results found.  Medications:   (feeding supplement) PROSource Plus  30 mL Oral BID BM   apixaban  2.5 mg Oral BID   vitamin C  500 mg Oral BID   Chlorhexidine Gluconate Cloth  6 each Topical Q12H   cholecalciferol  2,000 Units Oral Daily   clotrimazole-betamethasone   Topical BID   docusate sodium  100 mg Oral BID   fluticasone furoate-vilanterol  1 puff Inhalation Daily   furosemide  80 mg Oral Q M,W,F   hydrALAZINE  10 mg Oral TID   hydrocerin   Topical TID   insulin aspart  0-15 Units Subcutaneous TID AC & HS   insulin aspart  3 Units Subcutaneous TID WC   insulin glargine-yfgn  14 Units Subcutaneous Daily   isosorbide mononitrate  60 mg Oral Daily   melatonin  5 mg Oral QHS   multivitamin  1 tablet Oral QHS   neomycin-polymyxin b-dexamethasone  1 Application Right Eye TID   omega-3 acid ethyl esters  1 g Oral Daily   pantoprazole  40 mg Oral Daily   rosuvastatin  10 mg Oral Once  per day on Monday Wednesday Friday   sevelamer carbonate  1,600 mg Oral TID WC    Dialysis Orders: Jasim Melito is a 82 y.o. male with (newly declared during [facility] admit) ESRD, T2DM, HTN, CAD, HFpEF, A-fib (on Eliquis), and Hx SDH (01/2021) who was transferred from [facility] to [facility] for ongoing rehab.    Dialysis Orders: Has been accepted to [facility] on TTS per notes (will start after CIR d/c) Establishing orders.    Assessment/Plan: Debility: Admitted to CIR for ongoing rehab. ESRD:  Continue HD per TTS schedule - Next HD 10/22.  Using [facility]. Have not pursued permanent access at this time, may be hindrance to his therapy sessions to do inpatient.  Patient wife requesting education about renal diet and modalities.  Discussed briefly.   Hypertension/volume: BP variable, abdominal distention improving, continue UF as tolerated.  Establishing dry weight.  Reports standing for weights in HD. Anemia of ESRD: Hgb > 11.5 at this time, no  ESA needs. Tsat 19%.  If Hgb starts to drop will order IV iron course.  Metabolic bone disease: Ca ok, last Phos high. Renvela increased to 2 [facility] 10/18.  Pth 344, in goal for stage of CKD.  Nutrition: Alb low, continue renal diet + prot supps. T2DM - on insulin, management per PMD. HFpE  Virgina Norfolk, PA-C [facility] 01/10/2023,8:52 AM  LOS: 6 days

## 2023-01-10 NOTE — Progress Notes (Signed)
PROGRESS NOTE   Subjective/Complaints: Patient is unhappy with his bed reports this is causing his back to be uncomfortable, he would like an air mattress reports he had had any pain hospital.  No additional concerns.  ROS: Patient denies fever, chills, headache, shortness of breath, chest pain, abdominal pain, nausea, vomiting + Generalized weakness + Back pain at night-reports due to mattress being uncomfortable  Objective:   No results found. Recent Labs    01/09/23 1345  WBC 10.3  HGB 10.7*  HCT 33.6*  PLT 192    Recent Labs    01/09/23 1345  NA 136  K 3.7  CL 95*  CO2 22  GLUCOSE 116*  BUN 79*  CREATININE 6.47*  CALCIUM 8.5*     Intake/Output Summary (Last 24 hours) at 01/10/2023 1859 Last data filed at 01/10/2023 1748 Gross per 24 hour  Intake 716 ml  Output --  Net 716 ml        Physical Exam: Vital Signs Blood pressure (!) 122/55, pulse (!) 55, temperature (!) 97.5 F (36.4 C), temperature source Axillary, resp. rate 18, height 5\' 8"  (1.727 m), weight 84.8 kg, SpO2 100%. Body mass index is 28.43 kg/m.  General: No acute distress, lying in bed Agitated today-regarding his mattress Head and neck: NCAT, bilateral eye ectropion Heart: RRR, Lungs: CTAB, normal work of breathing on room air Abdomen: Positive bowel sounds, soft nontender to palpation, nondistended Extremities: Trace lower extremity edema Skin: No evidence of breakdown, severe stasis dermatitis changes  TDC R chest wall  Neurologic: Alert and awake, follows commands, cranial nerves II through XII grossly intact Moving all 4 extremities to gravity and resistance Sensory exam normal sensation to light touch bilateral upper and lower extremities No hypertonia noted Musculoskeletal: Full range of motion in all 4 extremities. No joint swelling  Assessment/Plan: 1. Functional deficits which require 3+ hours per day of  interdisciplinary therapy in a comprehensive inpatient rehab setting. Physiatrist is providing close team supervision and 24 hour management of active medical problems listed below. Physiatrist and rehab team continue to assess barriers to discharge/monitor patient progress toward functional and medical goals  Care Tool:  Bathing    Body parts bathed by patient: Right arm, Left arm, Chest, Abdomen, Face, Right upper leg, Right lower leg   Body parts bathed by helper: Left upper leg, Left lower leg, Buttocks     Bathing assist Assist Level: Maximal Assistance - Patient 24 - 49%     Upper Body Dressing/Undressing Upper body dressing   What is the patient wearing?: Hospital gown only    Upper body assist Assist Level: Minimal Assistance - Patient > 75%    Lower Body Dressing/Undressing Lower body dressing      What is the patient wearing?: Underwear/pull up     Lower body assist Assist for lower body dressing: Moderate Assistance - Patient 50 - 74%     Toileting Toileting    Toileting assist Assist for toileting: Maximal Assistance - Patient 25 - 49%     Transfers Chair/bed transfer  Transfers assist     Chair/bed transfer assist level: Minimal Assistance - Patient > 75%     Locomotion  Ambulation   Ambulation assist   Ambulation activity did not occur: Safety/medical concerns (fatigue, pain)  Assist level: Moderate Assistance - Patient 50 - 74% Assistive device: No Device Max distance: 50 feet   Walk 10 feet activity   Assist  Walk 10 feet activity did not occur: Safety/medical concerns (fatigue, pain)  Assist level: Moderate Assistance - Patient - 50 - 74% Assistive device: No Device   Walk 50 feet activity   Assist Walk 50 feet with 2 turns activity did not occur: Safety/medical concerns  Assist level: Moderate Assistance - Patient - 50 - 74% Assistive device: No Device    Walk 150 feet activity   Assist Walk 150 feet activity did not  occur: Safety/medical concerns         Walk 10 feet on uneven surface  activity   Assist Walk 10 feet on uneven surfaces activity did not occur: Safety/medical concerns (fatigue, pain)         Wheelchair     Assist Is the patient using a wheelchair?: Yes Type of Wheelchair: Manual Wheelchair activity did not occur: Safety/medical concerns (pain, fatigue)  Wheelchair assist level: Minimal Assistance - Patient > 75% Max wheelchair distance: 50    Wheelchair 50 feet with 2 turns activity    Assist    Wheelchair 50 feet with 2 turns activity did not occur: Safety/medical concerns (pain, fatigue)   Assist Level: Minimal Assistance - Patient > 75%   Wheelchair 150 feet activity     Assist  Wheelchair 150 feet activity did not occur: Safety/medical concerns (pain, fatigue)   Assist Level: Maximal Assistance - Patient 25 - 49%   Blood pressure (!) 122/55, pulse (!) 55, temperature (!) 97.5 F (36.4 C), temperature source Axillary, resp. rate 18, height 5\' 8"  (1.727 m), weight 84.8 kg, SpO2 100%.  Medical Problem List and Plan: 1. Functional deficits secondary to CHF             -patient may not  shower, HD cath             -ELOS/Goals: 18-21d, pt  goal is home by Birthday on 10/29  -Continue CIR  -Est Discharge 01/18/23    2.  Antithrombotics: -DVT/anticoagulation:  Pharmaceutical: Eliquis             -antiplatelet therapy:    3. Pain Management: Tylenol as needed  -Discussed with nursing air mattress-was told he will not qualify  -K-pad ordered   4. Mood/Behavior/Sleep: LCSW to evaluate and provide emotional support             -antipsychotic agents: n/a   5. Neuropsych/cognition: This patient is  capable of making decisions on his  own behalf.   6. Skin/Wound Care: Routine skin care checks, Eucerin cream for stasis dermatitis   7. Fluids/Electrolytes/Nutrition: Strict Is and Os and follow-up chemistries per nephrology             -renal diet  with FR             -continue vitamin C, D3, Renavit   8: Hypertension: monitor TID and prn             -continue doxazosin 1 mg BID-continue to hold             -continue Lasix 80 mg daily             -continue hydralazine 25 mg TID  -BP has been somewhat labile, continue to monitor trend.  Monitor for symptoms  of orthostatic hypotension, particularly after hemodialysis.  -BP stable overall, order thigh high compression stockings to avoid orthostatic hypotension  -10/17 patient declined use of compression stockings yesterday, BP stable overall  -10/19 decrease hydralazine to 10 mg 3 times daily  -10/20 BP soft at times, monitor response to medication change for now      01/10/2023    1:10 PM 01/10/2023    8:46 AM 01/10/2023    5:47 AM  Vitals with BMI  Systolic 122 95 105  Diastolic 55 48 54  Pulse 55  52     9: Hyperlipidemia: continue statin, Lovaza   10: Right eye infection probable blepharitis: continue Maxitrol 2 wk treatment    11: GERD: continue PPI   12: 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 hypoglycemia  -10/19 patient and wife were very concerned about hypoglycemia this morning, will dose increase Semglee to 14 units, consider increase mealtime insulin  -10/20 monitor response to medication change today    CBG (last 3)  Recent Labs    01/10/23 0622 01/10/23 1132 01/10/23 1711  GLUCAP 103* 273* 93       13: ESRD: HD on T/T/S via right internal jugular TDC  -Reviewed nephrology note-hemodialysis today completed   14: Chronic diastolic CHF: daily weight (dry weight ~220#)  EF is normal  -(meds as in #8) -fluid balance>>HD -10/20 weight trending down  Filed Weights   01/09/23 1332 01/09/23 1730 01/10/23 0500  Weight: 87.6 kg 85.6 kg  84.8 kg      15: Atrial fibrillation: on Eliquis - bradycardia , not taking BBs monitor rate  -Avoid negative chronotropic medications  Heart rate stable continue to monitor   16: COPD/acute hypoxic respiratory failure due to volume overload:  -continue Breo Ellipta             -Wean supplemental O2 as able   17: CAD: on statin, meds as in #8   18: Obesity: BMI = 30.17  -Dietary education   19: Anemia of chronic inflammation, cardiorenal disease ESRD: -continue Epogen 4000 units every TTS on hemodialysis days as per nephrology     20: Bilateral lower extremity lymphedema, chronic: -continue Lotrisone cream twice daily      LOS: 6 days A FACE TO FACE EVALUATION WAS PERFORMED  Fanny Dance 01/10/2023, 6:59 PM

## 2023-01-10 NOTE — Progress Notes (Addendum)
Physical Therapy Session Note  Patient Details  Name: Kyle Roberson MRN: [id number] Date of Birth: Apr 04, 1940  Today's Date: 01/10/2023 PT Individual Time: [phone], [phone] PT Individual Time Calculation (min): 55 min, 41 min   Short Term Goals: Week 1:  PT Short Term Goal 1 (Week 1): pt will perform bed mobility with min A consistantly PT Short Term Goal 2 (Week 1): pt will transfer sit<>stand with LRAD and CGA consistantly PT Short Term Goal 3 (Week 1): pt will ambulate 77ft with LRAD and min A  Skilled Therapeutic Interventions/Progress Updates:      Treatment Session 1  Pt seated in recliner upon arrival. Pt irritated and using foul language as he was under impression he wouldn't have therapy today. Pt eventually agreeable to therapy with therapist utilizing therapeutic use of self to offer support and encouragement. Pt denies any pain.   Pt reports need to use bathroom. Pt performed sit to stand from recliner with RW and heavy min A, verbal cues provided for technique, with emphasis on scooting forward and forward trunk lean x3 for momentum.   Pt ambulated from recliner to bathroom with RW and CGA, verbal/tactile cues provided for safety with RW. Pt continent of bowel and bladder. Pt donned and doffed pants and performed pericare with CGA/close supervision, and brief with mod A. Pt required seated rest break on toilet after donning/doffing pants 2/2 fatigue. Pt performed sit to stand from [facility] with RW and close supervision.   Pt ambulated from bathroom to sink with RW and CGA/min A with intention of washing hands but pt very fatigued and requiring seated rest break. Pt reports significant fatigue after dialysis yesterday.  Therapist assessed vitals: RA O2 100%, BP 118/72, HR 76  Pt performed sit to stand 2x2 with RW and CGA on first set and CGA/min A on 2nd set and increased time with prolonged rest break between trails, pt demos improved technique with verbal cues.    Remainder of session focused on seated therex 2/2 fatgiue. Pt performed the following exercises for B LE strengthening, verbal and tactile cues provided for completion within pt available range.    1x10 LAQ B   1x10 seated alternating marching  1x10 heel/toe raises    Pt seated in recliner with all needs within reach and seatbelt alarm on.   Treatment Session 2   Pt seated in recliner upon arrival with wife ([name]) in room. Pt reports 10/10 buttock/back pain. Pt very agitated, 2/2 back pain and requesting an airbed. Notified nursing. Nursing reports pt does not qualify for the air bed as there is no medical necessity. Therapist reiterated this to pt and wife.   Therapist offered pt sleeping in recliner with wheelchair cushion, pt reports OT had previously provided a cushion but it didn't help. Therapist provided pt with roho cushion for pressure relief and pain management. Therapist also provided pt with heat pack. Pt overall flexed over table in front of pt with pillow. Pt reports pain primarily on R side of back, unable to pinpoint location. MD notified, therapist requesting K pad.   Pt performed stand pivot transfer x4 throughout session with CGA/min A for power up, and supervision for pivot, verbal cues provided for technique.   Pt performed the following therex for B LE strengthening:   1x15 B LAQ  1x15 seated alternating marching  1x15 seated heel/toe raises B   1x15 active assisted long sitting hip abduction B    Pt seated in recliner at end of session with  all needs within reach and chair alarm on with wife in room.      Therapy Documentation Precautions:  Precautions Precautions: Fall Restrictions Weight Bearing Restrictions: No RLE Weight Bearing: Weight bearing as tolerated LLE Weight Bearing: Weight bearing as tolerated  Therapy/Group: Individual Therapy  [facility] [place], [place], DPT  01/10/2023, 7:42 AM

## 2023-01-10 NOTE — Plan of Care (Signed)
  Problem: RH Balance Goal: LTG Patient will maintain dynamic standing balance (PT) Description: LTG:  Patient will maintain dynamic standing balance with assistance during mobility activities (PT) Flowsheets (Taken 01/10/2023 0740) LTG: Pt will maintain dynamic standing balance during mobility activities with:: (downgraded 2/2 slow pt progress) Supervision/Verbal cueing   Problem: Sit to Stand Goal: LTG:  Patient will perform sit to stand with assistance level (PT) Description: LTG:  Patient will perform sit to stand with assistance level (PT) Flowsheets (Taken 01/10/2023 0740) LTG: PT will perform sit to stand in preparation for functional mobility with assistance level: (downgraded 2/2 slow pt progress) Supervision/Verbal cueing   Problem: RH Bed to Chair Transfers Goal: LTG Patient will perform bed/chair transfers w/assist (PT) Description: LTG: Patient will perform bed to chair transfers with assistance (PT). Flowsheets (Taken 01/10/2023 0740) LTG: Pt will perform Bed to Chair Transfers with assistance level: (downgraded 2/2 slow pt progress) Supervision/Verbal cueing   Problem: RH Car Transfers Goal: LTG Patient will perform car transfers with assist (PT) Description: LTG: Patient will perform car transfers with assistance (PT). Flowsheets (Taken 01/10/2023 0740) LTG: Pt will perform car transfers with assist:: (downgraded 2/2 slow pt progress and pt requiring assist from wife at baseline) Contact Guard/Touching assist  Downgraded goals to supervision 2/2 slow pt progress  Problem: RH Wheelchair Mobility Goal: LTG Patient will propel w/c in controlled environment (PT) Description: LTG: Patient will propel wheelchair in controlled environment, # of feet with assist (PT) Flowsheets (Taken 01/10/2023 0740) LTG: Pt will propel w/c in controlled environ  assist needed:: (downgraded 2/2 slow pt progress) Supervision/Verbal cueing Goal: LTG Patient will propel w/c in home environment  (PT) Description: LTG: Patient will propel wheelchair in home environment, # of feet with assistance (PT). Flowsheets (Taken 01/10/2023 0740) LTG: Pt will propel w/c in home environ  assist needed:: (downgraded 2/2 slow pt progress) Supervision/Verbal cueing

## 2023-01-11 ENCOUNTER — Inpatient Hospital Stay (HOSPITAL_COMMUNITY): Payer: Medicare HMO

## 2023-01-11 LAB — GLUCOSE, CAPILLARY
Glucose-Capillary: 124 mg/dL — ABNORMAL HIGH (ref 70–99)
Glucose-Capillary: 77 mg/dL (ref 70–99)
Glucose-Capillary: 147 mg/dL — ABNORMAL HIGH (ref 70–99)
Glucose-Capillary: 196 mg/dL — ABNORMAL HIGH (ref 70–99)
Glucose-Capillary: 171 mg/dL — ABNORMAL HIGH (ref 70–99)

## 2023-01-11 NOTE — Plan of Care (Signed)
  Problem: Consults Goal: RH GENERAL PATIENT EDUCATION Description: See Patient Education module for education specifics. Outcome: Progressing Goal: Diabetes Guidelines if Diabetic/Glucose > 140 Description: If diabetic or lab glucose is > 140 mg/dl - Initiate Diabetes/Hyperglycemia Guidelines & Document Interventions  Outcome: Progressing   Problem: RH BOWEL ELIMINATION Goal: RH STG MANAGE BOWEL WITH ASSISTANCE Description: STG Manage Bowel with min Assistance. Outcome: Progressing   Problem: RH BLADDER ELIMINATION Goal: RH STG MANAGE BLADDER WITH ASSISTANCE Description: STG Manage Bladder With min Assistance Outcome: Progressing   Problem: RH SKIN INTEGRITY Goal: RH STG MAINTAIN SKIN INTEGRITY WITH ASSISTANCE Description: STG Maintain Skin Integrity With min Assistance. Outcome: Progressing   Problem: RH SAFETY Goal: RH STG ADHERE TO SAFETY PRECAUTIONS W/ASSISTANCE/DEVICE Description: STG Adhere to Safety Precautions With min Assistance/Device. Outcome: Progressing   Problem: RH PAIN MANAGEMENT Goal: RH STG PAIN MANAGED AT OR BELOW PT'S PAIN GOAL Description: Pain level at or below 4/10 Outcome: Progressing   Problem: RH KNOWLEDGE DEFICIT GENERAL Goal: RH STG INCREASE KNOWLEDGE OF SELF CARE AFTER HOSPITALIZATION Description: Patient and family will be able to demonstrate understanding current medical state, medications used for symptom management, dietary modifications and fluid restriction independently using handouts and booklets provided. Outcome: Progressing   Problem: Education: Goal: Understanding of CV disease, CV risk reduction, and recovery process will improve Outcome: Progressing Goal: Individualized Educational Video(s) Outcome: Progressing   Problem: Activity: Goal: Ability to return to baseline activity level will improve Outcome: Progressing   Problem: Cardiovascular: Goal: Ability to achieve and maintain adequate cardiovascular perfusion will  improve Outcome: Progressing Goal: Vascular access site(s) Level 0-1 will be maintained Outcome: Progressing   Problem: Health Behavior/Discharge Planning: Goal: Ability to safely manage health-related needs after discharge will improve Outcome: Progressing   Problem: Education: Goal: Knowledge of General Education information will improve Description: Including pain rating scale, medication(s)/side effects and non-pharmacologic comfort measures Outcome: Progressing   Problem: Health Behavior/Discharge Planning: Goal: Ability to manage health-related needs will improve Outcome: Progressing   Problem: Clinical Measurements: Goal: Ability to maintain clinical measurements within normal limits will improve Outcome: Progressing Goal: Will remain free from infection Outcome: Progressing Goal: Diagnostic test results will improve Outcome: Progressing Goal: Respiratory complications will improve Outcome: Progressing Goal: Cardiovascular complication will be avoided Outcome: Progressing   Problem: Activity: Goal: Risk for activity intolerance will decrease Outcome: Progressing   Problem: Nutrition: Goal: Adequate nutrition will be maintained Outcome: Progressing   Problem: Coping: Goal: Level of anxiety will decrease Outcome: Progressing   Problem: Elimination: Goal: Will not experience complications related to bowel motility Outcome: Progressing Goal: Will not experience complications related to urinary retention Outcome: Progressing   Problem: Pain Managment: Goal: General experience of comfort will improve Outcome: Progressing   Problem: Safety: Goal: Ability to remain free from injury will improve Outcome: Progressing   Problem: Skin Integrity: Goal: Risk for impaired skin integrity will decrease Outcome: Progressing

## 2023-01-11 NOTE — Progress Notes (Signed)
Contacted [facility] today to confirm pt's out-pt HD arrangements and was advised that pt's referral had been cancelled when pt was tx to [facility] from [place]. New referral will need to be submitted. Contacted pt's wife to confirm that pt/family prefer [facility]. Wife confirms that this is their clinic preference. Referral submitted to [facility]missions for review. Pt has a target d/c date from [facility] for 10/28. Update provided to nephrologist and CSW. Will assist as needed.   Olivia Canter Renal Navigator [phone]

## 2023-01-11 NOTE — Progress Notes (Signed)
Spoke with patient's wife this morning concerning air mattress request. Patient presents with no breakdown to buttocks or sacral area. Patient is presently mobile with +1 rolling walker and about to alleviate pain with shifting in the chair from side to side. Patient given multiple suggestions such as cushion in the recliner chair, patient refused. Currently requesting for dialysis in the chair due to patient does not like to stay in bed for long periods of time. K-pad has been requested via order. Patient was given Prn Tylenol this morning for lower back pain of 10/10. Nurse informed patient that when k-pad is available she will bring it to his room. Nurse is arranging patient to have dialysis in chair instead of bed. Cletis Media, RN

## 2023-01-11 NOTE — Progress Notes (Signed)
   01/11/23 1500  What Happened  Was fall witnessed? Yes  Who witnessed fall? Francy-OT  Patients activity before fall other (comment) (with therapy)  Point of contact other (comment) (left knee)  Was patient injured? No  Provider Notification  Provider Name/Title Dois Davenport PA  Date Provider Notified 01/11/23  Time Provider Notified 1500  Method of Notification Call  Notification Reason Fall  Provider response See new orders  Date of Provider Response 01/11/23  Time of Provider Response 1500  Follow Up  Family notified Yes - comment (Significant other)  Time family notified 1507  Additional tests Yes-comment (x-ray)  Adult Fall Risk Assessment  Risk Factor Category (scoring not indicated) Fall has occurred during this admission (document High fall risk)  Age 82  Fall History: Fall within 6 months prior to admission 5  Elimination; Bowel and/or Urine Incontinence 0  Elimination; Bowel and/or Urine Urgency/Frequency 0  Medications: includes PCA/Opiates, Anti-convulsants, Anti-hypertensives, Diuretics, Hypnotics, Laxatives, Sedatives, and Psychotropics 3  Patient Care Equipment 0  Mobility-Assistance 2  Mobility-Gait 2  Mobility-Sensory Deficit 2  Altered awareness of immediate physical environment 0  Impulsiveness 0  Lack of understanding of one's physical/cognitive limitations 0  Total Score 17  Patient Fall Risk Level High fall risk  Adult Fall Risk Interventions  Required Bundle Interventions *See Row Information* High fall risk - low, moderate, and high requirements implemented  Additional Interventions Lap belt while in chair/wheelchair (Rehab only);PT/OT need assessed if change in mobility from baseline;Use of appropriate toileting equipment (bedpan, BSC, etc.)  Screening for Fall Injury Risk (To be completed on HIGH fall risk patients) - Assessing Need for Floor Mats  Risk For Fall Injury- Criteria for Floor Mats Previous fall this admission  Will Implement Floor Mats  Yes  Vitals  Temp (!) 97.5 F (36.4 C)  Temp Source Oral  BP (!) 82/52  MAP (mmHg) (!) 62  BP Location Left Arm  BP Method Automatic  Patient Position (if appropriate) Sitting  Pulse Rate 64  Pulse Rate Source Dinamap  Resp 18  Oxygen Therapy  SpO2 100 %  O2 Device Room Air  Patient Activity (if Appropriate) In chair  Pulse Oximetry Type Intermittent  Pain Assessment  Pain Scale 0-10  Pain Score 10  Pain Type Chronic pain  Pain Location Back  Pain Orientation Lower  Pain Descriptors / Indicators Aching;Constant  Pain Frequency Constant  Pain Onset On-going  Patients Stated Pain Goal 0  Pain Intervention(s) Repositioned (placed in recliner)  Multiple Pain Sites No  Neurological  Neuro (WDL) X  Level of Consciousness Alert  Orientation Level Oriented X4  Cognition Follows commands  Speech Clear  R Hand Grip Moderate  L Hand Grip Moderate  RUE Motor Response Purposeful movement  LUE Motor Response Purposeful movement  RLE Motor Response Purposeful movement  LLE Motor Response Purposeful movement  Neuro Symptoms None  Musculoskeletal  Musculoskeletal (WDL) X  Assistive Device Wheelchair;Front wheel walker  Generalized Weakness Yes  Weight Bearing Restrictions No  Musculoskeletal Details  RUE Weakness  LUE Weakness  RLE Weakness  LLE Weakness  Integumentary  Integumentary (WDL) X  Skin Color Appropriate for ethnicity  Skin Condition Flaky;Dry;Itching  Itching intervention cream  Skin Integrity Erythema/redness;Ecchymosis;Abrasion  Abrasion Location Ear  Abrasion Location Orientation Left  Abrasion Intervention Other (Comment) (scabbed)  Ecchymosis Location Arm;Leg  Ecchymosis Location Orientation Bilateral;Other (Comment) (posterior legs)  Erythema/Redness Location Leg;Buttocks  Erythema/Redness Location Orientation Bilateral  Skin Turgor Non-tenting

## 2023-01-11 NOTE — Progress Notes (Signed)
Occupational Therapy Weekly Progress Note  Patient Details  Name: Kyle Roberson MRN: [id number] Date of Birth: 1940/08/13  Beginning of progress report period: January 05, 2023 End of progress report period: January 11, 2023  Today's Date: 01/11/2023 OT Individual Time: 4098-1191 OT Individual Time Calculation (min): 73 min   Today's Date: 01/11/2023 OT Individual Time: 1420-1500 OT Individual Time Calculation (min): 40 min  and Today's Date: 01/11/2023 OT Missed Time: 30 Minutes Missed Time Reason: Other (comment) (Recovering from fall)   Patient has met 3 of 3 short term goals. See below for updated ADL functioning. Caregiver education planned for 10/24 with spouse, [name].   Patient continues to demonstrate the following deficits:  and therefore will continue to benefit from skilled OT intervention to enhance overall performance with BADL and Reduce care partner burden.muscle weakness, decreased cardiorespiratoy endurance, decreased awareness, decreased problem solving, decreased safety awareness, and decreased memory, and decreased standing balance and decreased balance strategies  Patient progressing toward long term goals..  Continue plan of care.  OT Short Term Goals Week 1:  OT Short Term Goal 1 (Week 1): Pt will stand up to 2 min for grooming sink side level OT Short Term Goal 1 - Progress (Week 1): Met OT Short Term Goal 2 (Week 1): Pt will complete LB self care with AE as needed with mod A OT Short Term Goal 2 - Progress (Week 1): Met OT Short Term Goal 3 (Week 1): Pt will complete toilet transfer with CGA using LRAD OT Short Term Goal 3 - Progress (Week 1): Met Week 2:  OT Short Term Goal 1 (Week 2): STGs=LTGs due to patient's length of stay.  Skilled Therapeutic Interventions/Progress Updates:   Session 1: Pt received sitting in recliner for skilled OT session with focus on BADL retraining and functional transfers. Pt agreeable to interventions, demonstrating  overall pleasant mood. Pt reported 10/10 sacral pain, medicated during session. ROHO cushion provided. OT offering intermediate rest breaks and positioning suggestions throughout session to address pain/fatigue and maximize participation/safety in session.   Pt continues to refuse donning of TEDs, receptive to elevating BLE during day for edema management. Receptive to sponge-bathing.  UB: Bathing with setup + encouragement. Dressing with Min A to bring sweater material overhead, despite cuing to thread head first and then BUE. LB: Bathing with Min A for B feet, would benefit from long-handled sponge. Pt voices he owns a suction-cup foot scrubber. Dressing with education on use of reacher + Min A for threading, standing with supervision + RW to hike with same level of assistance.   Pt ambulates from recliner>sink-side with close supervision + RW, sitting on WC to complete oral care with distant supervision. Pt propels WC from room door>day room for general strengthening. In day room, pt instructed in steps-ups, completing 1x8 (4in) & 2x5 (6in), pt CGA-Min A + RW.   Pt remained sitting in recliner with all immediate needs met at end of session. Pt continues to be appropriate for skilled OT intervention to promote further functional independence.   Session 2: Pt received sitting in recliner for skilled OT session with focus on functional transfers. Pt agreeable to interventions, demonstrating overall pleasant mood. Pt with no reports of pain. OT offering intermediate rest breaks and positioning suggestions throughout session to address pain/fatigue and maximize participation/safety in session.   Pt performs initial STS from recliner with Min A + RW, there after completing with close supervision. Pt dependent for WC transport from room<>ortho gym for energy conservation. In  ortho gym, pt and OT discuss options of transportation home, pt stating he only has a [employer] and previously side-stepped onto a  cinder block to reach passenger seat, therefore similar transfer simulated with use of step. Pt able to take first backward with LLE, but upon elevating RLE, pt with knee-buckling, requiring assistance to lower safely to the step. Pt requires Max A x2 to reach full stance. RN updated and full assessment completed, vitals detailed below: BP=82/52 O2=100 HR=64  Pt declines application of ice to bilateral knees or assistance with positioning on recliner, missing ~30 minutes of skilled intervention as a result of fall. Pt remained sitting in recliner with all immediate needs met at end of session. Pt continues to be appropriate for skilled OT intervention to promote further functional independence.   Therapy Documentation Precautions:  Precautions Precautions: Fall Restrictions Weight Bearing Restrictions: No RLE Weight Bearing: Weight bearing as tolerated LLE Weight Bearing: Weight bearing as tolerated   Therapy/Group: Individual Therapy  Lou Cal, OTR/L, MSOT  01/11/2023, 5:44 AM

## 2023-01-11 NOTE — Progress Notes (Signed)
[facility] Progress Note   Subjective:   Patient seen and examined at bedside. Reports diarrhea overnight and abdominal pain this AM. Did not sleep well. Requesting air mattress - PMD checking into if he can get one.  Denies CP, SOB, and n/v.  Tolerated dialysis well yesterday.   Objective Vitals:   01/10/23 2016 01/11/23 0439 01/11/23 0500 01/11/23 0747  BP: 91/64 (!) 106/50  (!) 109/49  Pulse: 77     Resp: 16 18    Temp: 98.1 F (36.7 C) 98 F (36.7 C)    TempSrc: Oral     SpO2: 93% 98%    Weight:   88.6 kg   Height:       Physical Exam General:chronically ill appearing male in NAD Heart:RRR, no mrg Lungs:CTAB anteriorly Abdomen:soft, ND Extremities:chronic skin changes, no LE edema Dialysis Access: [facility]   Filed Weights   01/09/23 1730 01/10/23 0500 01/11/23 0500  Weight: 85.6 kg 84.8 kg 88.6 kg    Intake/Output Summary (Last 24 hours) at 01/11/2023 1023 Last data filed at 01/11/2023 0700 Gross per 24 hour  Intake 834 ml  Output --  Net 834 ml    Additional Objective Labs: Basic Metabolic Panel: Recent Labs  Lab 01/07/23 1452 01/09/23 1345  NA 134* 136  K 4.0 3.7  CL 96* 95*  CO2 21* 22  GLUCOSE 171* 116*  BUN 81* 79*  CREATININE 7.43* 6.47*  CALCIUM 8.1* 8.5*  PHOS 7.6* 5.7*   Liver Function Tests: Recent Labs  Lab 01/07/23 1452 01/09/23 1345  ALBUMIN 3.0* 2.9*   CBC: Recent Labs  Lab 01/07/23 1452 01/09/23 1345  WBC 10.1 10.3  HGB 11.7* 10.7*  HCT 36.7* 33.6*  MCV 93.4 91.3  PLT 211 192   CBG: Recent Labs  Lab 01/10/23 1132 01/10/23 1711 01/10/23 2127 01/11/23 0236 01/11/23 0543  GLUCAP 273* 93 71 124* 77   Iron Studies:  No results for input(s): "IRON", "TIBC", "TRANSFERRIN", "FERRITIN" in the last 72 hours.  Lab Results  Component Value Date   INR 1.5 (H) 12/20/2022   INR 1.2 12/07/2022   INR 1.6 (H) 12/03/2022   Studies/Results: No results found.  Medications:   (feeding supplement) PROSource  Plus  30 mL Oral BID BM   apixaban  2.5 mg Oral BID   vitamin C  500 mg Oral BID   Chlorhexidine Gluconate Cloth  6 each Topical Q12H   cholecalciferol  2,000 Units Oral Daily   clotrimazole-betamethasone   Topical BID   docusate sodium  100 mg Oral BID   fluticasone furoate-vilanterol  1 puff Inhalation Daily   furosemide  80 mg Oral Q M,W,F   hydrocerin   Topical TID   insulin aspart  0-15 Units Subcutaneous TID AC & HS   insulin aspart  3 Units Subcutaneous TID WC   insulin glargine-yfgn  14 Units Subcutaneous Daily   isosorbide mononitrate  60 mg Oral Daily   melatonin  5 mg Oral QHS   multivitamin  1 tablet Oral QHS   neomycin-polymyxin b-dexamethasone  1 Application Right Eye TID   omega-3 acid ethyl esters  1 g Oral Daily   pantoprazole  40 mg Oral Daily   rosuvastatin  10 mg Oral Once per day on Monday Wednesday Friday   sevelamer carbonate  1,600 mg Oral TID WC    Dialysis Orders: Kyle Roberson is a 82 y.o. 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    Dialysis Orders: Has been accepted to [facility] on TTS per notes (will start after CIR d/c) Establishing orders.    Assessment/Plan: Debility: Admitted to CIR for ongoing rehab. ESRD:  Continue HD per TTS schedule - Next HD 10/22.  Using [facility]. Have not pursued permanent access at this time, may be hindrance to his therapy sessions to do inpatient, can revisit as an outpatient. Hypertension/volume: BP variable, abdominal distention improving, continue UF as tolerated.  Establishing dry weight.  Reports standing for weights in HD. Anemia of ESRD: no ESA needs at this time. Tsat 19%.  If Hgb starts to drop will order IV iron course. Hgb 10.7 Metabolic bone disease: Ca ok, last Phos high. Renvela increased to 2 [facility] 10/18.  Pth 344, in goal for stage of CKD. Po4 improving  Nutrition: Alb low,  continue renal diet + prot supps. T2DM - on insulin, management per PMD. HFpEF: UF as tolerated   Anthony Sar, MD [facility]

## 2023-01-11 NOTE — Progress Notes (Signed)
Patient ID: Kyle Roberson, male   DOB: 1940-09-04, 82 y.o.   MRN: [id number] Spoke with team who reports wife can be here on Thursday 10-12 for training and will bring the lowest car they have which is an Materials engineer. Pt can not start HD on Sat due to new pt.

## 2023-01-11 NOTE — Progress Notes (Addendum)
Physical Therapy Session Note  Patient Details  Name: Kyle Roberson MRN: [id number] Date of Birth: 08-03-1940  Today's Date: 01/11/2023 PT Individual Time: 1015-1114 PT Individual Time Calculation (min): 59 min   Short Term Goals: Week 1:  PT Short Term Goal 1 (Week 1): pt will perform bed mobility with min A consistantly PT Short Term Goal 2 (Week 1): pt will transfer sit<>stand with LRAD and CGA consistantly PT Short Term Goal 3 (Week 1): pt will ambulate 71ft with LRAD and min A  Skilled Therapeutic Interventions/Progress Updates:      Pt seated in recliner upon arrival. Pt agreeable to therapy. Pt deneis any pain.  Car simulator unavailable to practice car transfer.    Pt ascended/descended 8 3 inch steps with B HR and step to gait and CGA, verbal cues provided for B LE positioning on step for safety.   Pt ambulated 100 feet with RW and close supervision, verbal cues provided for safety with RW. Pt ambulated 15 feet with RW and supervision and performed ambulatory transfer to recliner with supervision, pt demos improved safety with RW.   Pt ambulated 3x40 feet with no AD and L HHA with min A, with wide BOS and decreased step length, verbal cues provided for correction.   Pt ambulated 2x40 feet with quad cane and CGA/min A, vebral cues provided for technique.   Pt demos and reports increased stability with RW in comparison to no AD or SPC. Therapist recommending gait at home with RW. Pt verablized understanding and agreeable. Education provided to progress gait with no AD with follow up HHPT. Pt is refusing follow up HHPT as he does not want anyone coming to his house. Education provided for OOPT. Pt refusing OPPT at this time. Will continue to follow up with pt.   Pt sit to stand throughout session with supervision from [facility] and standard chair with RW, pt requiring min A for sit to stand from recliner, verbal cues provided for technique.   Pt performed ambulatory transfer to  surface with supervision with RW, but required min-mod A with no AD, or with quad cane as pt attempting to sit prematurely 2/2 fatigue. Verbal cues provided not to sit until back of B UE touching chair.   Pt seated in recliner at end of session with chair alarm on and needs within reach.      Therapy Documentation Precautions:  Precautions Precautions: Fall Restrictions Weight Bearing Restrictions: No RLE Weight Bearing: Weight bearing as tolerated LLE Weight Bearing: Weight bearing as tolerated  Therapy/Group: Individual Therapy  [facility] Kyle Roberson, [place], DPT  01/11/2023, 7:56 AM

## 2023-01-11 NOTE — Progress Notes (Signed)
Patient refused CHG bath this morning. Education reinforced.

## 2023-01-11 NOTE — Progress Notes (Signed)
Nursing reported patient had fall with therapy. Stated knee buckled and left knee may have it the floor. Patient complaining of hip and knee pain. X-rays ordered.

## 2023-01-11 NOTE — Progress Notes (Addendum)
Patient and wife still requesting air mattress for patient.   Patient sleep improved this shift compared to previous night. Melatonin refused but patient did receive 2 tylenol prior to bedtime for back pain. Patient reported some relief with this in addition to positioning in bed. CBG at HS 71. Patient refused the bag HS snack but did consume 120cc of grape juice and had a pack of figs.

## 2023-01-11 NOTE — Progress Notes (Signed)
PROGRESS NOTE   Subjective/Complaints: Still complaining about the bed. Otherwise seems to be doing well.   ROS: Patient denies fever, rash, sore throat, blurred vision, dizziness, nausea, vomiting, diarrhea, cough, shortness of breath or chest pain,  headache, or mood change.     Objective:   No results found. Recent Labs    01/09/23 1345  WBC 10.3  HGB 10.7*  HCT 33.6*  PLT 192    Recent Labs    01/09/23 1345  NA 136  K 3.7  CL 95*  CO2 22  GLUCOSE 116*  BUN 79*  CREATININE 6.47*  CALCIUM 8.5*     Intake/Output Summary (Last 24 hours) at 01/11/2023 1010 Last data filed at 01/11/2023 0700 Gross per 24 hour  Intake 834 ml  Output --  Net 834 ml        Physical Exam: Vital Signs Blood pressure (!) 109/49, pulse 77, temperature 98 F (36.7 C), resp. rate 18, height 5\' 8"  (1.727 m), weight 88.6 kg, SpO2 98%. Body mass index is 29.7 kg/m.  Constitutional: No distress . Vital signs reviewed. HEENT: NCAT, EOMI, oral membranes moist Neck: supple Cardiovascular: RRR without murmur. No JVD    Respiratory/Chest: CTA Bilaterally without wheezes or rales. Normal effort    GI/Abdomen: BS +, non-tender, non-distended Ext: no clubbing, cyanosis, or edema Psych: pleasant and cooperative  Skin: No evidence of breakdown, severe stasis dermatitis changes  TDC R chest wall  Neurologic: Alert and oriented x 3. Normal insight and awareness. Intact Memory. Normal language and speech. Cranial nerve exam unremarkable. Moves all 4's 4/5.   Sensory exam normal sensation to light touch bilateral upper and lower extremities No hypertonia noted Musculoskeletal: Full range of motion in all 4 extremities. No joint swelling  Assessment/Plan: 1. Functional deficits which require 3+ hours per day of interdisciplinary therapy in a comprehensive inpatient rehab setting. Physiatrist is providing close team supervision and 24 hour  management of active medical problems listed below. Physiatrist and rehab team continue to assess barriers to discharge/monitor patient progress toward functional and medical goals  Care Tool:  Bathing    Body parts bathed by patient: Right arm, Left arm, Chest, Abdomen, Face, Right upper leg, Right lower leg   Body parts bathed by helper: Left upper leg, Left lower leg, Buttocks     Bathing assist Assist Level: Maximal Assistance - Patient 24 - 49%     Upper Body Dressing/Undressing Upper body dressing   What is the patient wearing?: Hospital gown only    Upper body assist Assist Level: Minimal Assistance - Patient > 75%    Lower Body Dressing/Undressing Lower body dressing      What is the patient wearing?: Underwear/pull up     Lower body assist Assist for lower body dressing: Moderate Assistance - Patient 50 - 74%     Toileting Toileting    Toileting assist Assist for toileting: Maximal Assistance - Patient 25 - 49%     Transfers Chair/bed transfer  Transfers assist     Chair/bed transfer assist level: Minimal Assistance - Patient > 75%     Locomotion Ambulation   Ambulation assist   Ambulation activity did not  occur: Safety/medical concerns (fatigue, pain)  Assist level: Moderate Assistance - Patient 50 - 74% Assistive device: No Device Max distance: 50 feet   Walk 10 feet activity   Assist  Walk 10 feet activity did not occur: Safety/medical concerns (fatigue, pain)  Assist level: Moderate Assistance - Patient - 50 - 74% Assistive device: No Device   Walk 50 feet activity   Assist Walk 50 feet with 2 turns activity did not occur: Safety/medical concerns  Assist level: Moderate Assistance - Patient - 50 - 74% Assistive device: No Device    Walk 150 feet activity   Assist Walk 150 feet activity did not occur: Safety/medical concerns         Walk 10 feet on uneven surface  activity   Assist Walk 10 feet on uneven surfaces  activity did not occur: Safety/medical concerns (fatigue, pain)         Wheelchair     Assist Is the patient using a wheelchair?: Yes Type of Wheelchair: Manual Wheelchair activity did not occur: Safety/medical concerns (pain, fatigue)  Wheelchair assist level: Minimal Assistance - Patient > 75% Max wheelchair distance: 50    Wheelchair 50 feet with 2 turns activity    Assist    Wheelchair 50 feet with 2 turns activity did not occur: Safety/medical concerns (pain, fatigue)   Assist Level: Minimal Assistance - Patient > 75%   Wheelchair 150 feet activity     Assist  Wheelchair 150 feet activity did not occur: Safety/medical concerns (pain, fatigue)   Assist Level: Maximal Assistance - Patient 25 - 49%   Blood pressure (!) 109/49, pulse 77, temperature 98 F (36.7 C), resp. rate 18, height 5\' 8"  (1.727 m), weight 88.6 kg, SpO2 98%.  Medical Problem List and Plan: 1. Functional deficits secondary to CHF             -patient may not  shower, HD cath             -ELOS/Goals: 18-21d, pt  goal is home by Birthday on 10/29  -Continue CIR therapies including PT, OT   -Est Discharge 01/18/23    2.  Antithrombotics: -DVT/anticoagulation:  Pharmaceutical: Eliquis             -antiplatelet therapy:    3. Pain Management: Tylenol as needed  -Discussed with nursing air mattress-was told he will not qualify  -K-pad ordered   4. Mood/Behavior/Sleep: LCSW to evaluate and provide emotional support             -antipsychotic agents: n/a   5. Neuropsych/cognition: This patient is  capable of making decisions on his  own behalf.   6. Skin/Wound Care: Routine skin care checks, Eucerin cream for stasis dermatitis   7. Fluids/Electrolytes/Nutrition: Strict Is and Os and follow-up chemistries per nephrology             -renal diet with FR             -continue vitamin C, D3, Renavit   8: Hypertension: monitor TID and prn             -continue doxazosin 1 mg BID-continue  to hold             -continue Lasix 80 mg daily             -continue hydralazine 25 mg TID  -BP has been somewhat labile, continue to monitor trend.  Monitor for symptoms of orthostatic hypotension, particularly after hemodialysis.  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 hypoglycemia  -10/19 patient and wife were very concerned about hypoglycemia this morning, will dose increase Semglee to 14 units, consider increase mealtime insulin  -10/21 tight control. Avoid excessive hypoglycemia    CBG (last 3)  Recent Labs    01/10/23 2127 01/11/23 0236 01/11/23 0543  GLUCAP 71 124* 77       13: ESRD: HD on T/T/S via right internal jugular TDC  -Reviewed nephrology note-hemodialysis today completed   14: Chronic diastolic CHF: daily weight (dry weight ~220#)  EF is normal  -(meds as in #8) -fluid balance>>HD -10/21 weights fluctuate with HD  Filed Weights   01/09/23 1730 01/10/23 0500 01/11/23 0500  Weight: 85.6 kg 84.8 kg 88.6 kg      15: Atrial fibrillation: on Eliquis - bradycardia , not taking BBs monitor rate  -Avoid negative chronotropic medications  Heart  rate stable continue to monitor   16: COPD/acute hypoxic respiratory failure due to volume overload:  -continue Breo Ellipta             -Wean supplemental O2 as able   17: CAD: on statin, meds as in #8   18: Obesity: BMI = 30.17  -Dietary education   19: Anemia of chronic inflammation, cardiorenal disease ESRD: -continue Epogen 4000 units every TTS on hemodialysis days as per nephrology     20: Bilateral lower extremity lymphedema, chronic: -continue Lotrisone cream twice daily      LOS: 7 days A FACE TO FACE EVALUATION WAS PERFORMED  Ranelle Oyster 01/11/2023, 10:10 AM

## 2023-01-12 DIAGNOSIS — E669 Obesity, unspecified: Secondary | ICD-10-CM

## 2023-01-12 DIAGNOSIS — W19XXXD Unspecified fall, subsequent encounter: Secondary | ICD-10-CM

## 2023-01-12 LAB — GLUCOSE, CAPILLARY
Glucose-Capillary: 143 mg/dL — ABNORMAL HIGH (ref 70–99)
Glucose-Capillary: 104 mg/dL — ABNORMAL HIGH (ref 70–99)
Glucose-Capillary: 166 mg/dL — ABNORMAL HIGH (ref 70–99)
Glucose-Capillary: 107 mg/dL — ABNORMAL HIGH (ref 70–99)
Glucose-Capillary: 79 mg/dL (ref 70–99)

## 2023-01-12 MED ORDER — INSULIN GLARGINE-YFGN 100 UNIT/ML ~~LOC~~ SOLN
12.0000 [IU] | Freq: Every day | SUBCUTANEOUS | Status: DC
  Filled 2023-01-12: qty 0.12

## 2023-01-12 NOTE — Progress Notes (Signed)
[facility] Progress Note   Subjective:   Patient seen and examined at bedside. Had a fall yesterday during PT. Has some pain otherwise no complaints.  Objective Vitals:   01/11/23 2048 01/11/23 2217 01/12/23 0139 01/12/23 0620  BP: (!) 81/43 (!) 119/47 (!) 105/49 (!) 107/49  Pulse: 64 (!) 50 (!) 50 (!) 52  Resp: 18   18  Temp:    97.8 F (36.6 C)  TempSrc:      SpO2: 100% 100% 96% 99%  Weight:      Height:       Physical Exam General:chronically ill appearing male in NAD, sitting in recliner Heart:RRR, no mrg Lungs:CTAB anteriorly Abdomen:soft, ND Extremities:chronic skin changes, no LE edema Dialysis Access: [facility]   Filed Weights   01/09/23 1730 01/10/23 0500 01/11/23 0500  Weight: 85.6 kg 84.8 kg 88.6 kg    Intake/Output Summary (Last 24 hours) at 01/12/2023 1158 Last data filed at 01/12/2023 6578 Gross per 24 hour  Intake 472 ml  Output --  Net 472 ml    Additional Objective Labs: Basic Metabolic Panel: Recent Labs  Lab 01/07/23 1452 01/09/23 1345  NA 134* 136  K 4.0 3.7  CL 96* 95*  CO2 21* 22  GLUCOSE 171* 116*  BUN 81* 79*  CREATININE 7.43* 6.47*  CALCIUM 8.1* 8.5*  PHOS 7.6* 5.7*   Liver Function Tests: Recent Labs  Lab 01/07/23 1452 01/09/23 1345  ALBUMIN 3.0* 2.9*   CBC: Recent Labs  Lab 01/07/23 1452 01/09/23 1345  WBC 10.1 10.3  HGB 11.7* 10.7*  HCT 36.7* 33.6*  MCV 93.4 91.3  PLT 211 192   CBG: Recent Labs  Lab 01/11/23 1128 01/11/23 1636 01/11/23 2124 01/12/23 0141 01/12/23 0614  GLUCAP 147* 196* 171* 143* 104*   Iron Studies:  No results for input(s): "IRON", "TIBC", "TRANSFERRIN", "FERRITIN" in the last 72 hours.  Lab Results  Component Value Date   INR 1.5 (H) 12/20/2022   INR 1.2 12/07/2022   INR 1.6 (H) 12/03/2022   Studies/Results: DG Knee 1-2 Views Right  Result Date: 01/11/2023 CLINICAL DATA:  Fall while doing PT, initial encounter EXAM: RIGHT KNEE - 2 VIEW COMPARISON:  None  Available. FINDINGS: Tricompartmental degenerative changes are noted worst in the medial joint space. No acute fracture or dislocation is seen. No joint effusion is noted. IMPRESSION: No acute abnormality noted. Electronically Signed   By: Alcide Clever M.D.   On: 01/11/2023 21:51   DG HIP UNILAT WITH PELVIS 2-3 VIEWS LEFT  Result Date: 01/11/2023 CLINICAL DATA:  Recent fall in physical therapy with left hip pain, initial encounter EXAM: DG HIP (WITH OR WITHOUT PELVIS) 3V LEFT COMPARISON:  None Available. FINDINGS: Mild degenerative changes of left hip are noted. Vascular calcifications are seen. Pelvic ring is intact. No acute soft tissue abnormality is noted. IMPRESSION: No acute abnormality noted. Electronically Signed   By: Alcide Clever M.D.   On: 01/11/2023 21:51   DG HIP UNILAT WITH PELVIS 2-3 VIEWS RIGHT  Result Date: 01/11/2023 CLINICAL DATA:  Recent fall with right hip pain, initial encounter EXAM: DG HIP (WITH OR WITHOUT PELVIS) 2V RIGHT COMPARISON:  None Available. FINDINGS: No acute fracture or dislocation is noted. Mild degenerative changes are seen. Vascular calcifications are noted. IMPRESSION: No acute bony abnormality noted. Electronically Signed   By: Alcide Clever M.D.   On: 01/11/2023 21:50   DG Knee 1-2 Views Left  Result Date: 01/11/2023 CLINICAL DATA:  Recent fall in physical therapy  with knee pain, initial encounter EXAM: LEFT KNEE - 2 VIEW COMPARISON:  None Available. FINDINGS: Tricompartmental degenerative changes are noted. No acute fracture or dislocation is seen. No joint effusion is noted. No soft tissue changes are noted. IMPRESSION: Degenerative change without acute abnormality Electronically Signed   By: Alcide Clever M.D.   On: 01/11/2023 21:49    Medications:   (feeding supplement) PROSource Plus  30 mL Oral BID BM   apixaban  2.5 mg Oral BID   vitamin C  500 mg Oral BID   Chlorhexidine Gluconate Cloth  6 each Topical Q12H   cholecalciferol  2,000 Units Oral  Daily   clotrimazole-betamethasone   Topical BID   docusate sodium  100 mg Oral BID   fluticasone furoate-vilanterol  1 puff Inhalation Daily   furosemide  80 mg Oral Q M,W,F   hydrocerin   Topical TID   insulin aspart  0-15 Units Subcutaneous TID AC & HS   insulin aspart  3 Units Subcutaneous TID WC   insulin glargine-yfgn  14 Units Subcutaneous Daily   isosorbide mononitrate  60 mg Oral Daily   melatonin  5 mg Oral QHS   multivitamin  1 tablet Oral QHS   neomycin-polymyxin b-dexamethasone  1 Application Right Eye TID   omega-3 acid ethyl esters  1 g Oral Daily   pantoprazole  40 mg Oral Daily   rosuvastatin  10 mg Oral Once per day on Monday Wednesday Friday   sevelamer carbonate  1,600 mg Oral TID WC    Dialysis Orders: Kyle Roberson is a 82 y.o. 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    Dialysis Orders: Has been accepted to [facility] on TTS per notes (will start after CIR d/c) Establishing orders.    Assessment/Plan: Debility: Admitted to CIR for ongoing rehab. ESRD:  Continue HD per TTS schedule - Next HD 10/22.  Using [facility]. Have not pursued permanent access at this time, may be hindrance to his therapy sessions to do inpatient, can revisit as an outpatient. His eventual goal is to be able to do renal replacement therapy at home Hypertension/volume: BP variable, abdominal distention improving, continue UF as tolerated.  Establishing dry weight.  Report standing for weights in HD. Anemia of ESRD: no ESA needs at this time. Tsat 19%.  If Hgb starts to drop will order IV iron course. Hgb 10.7 Metabolic bone disease: Ca ok, last Phos high. Renvela increased to 2 [facility] 10/18.  Pth 344, in goal for stage of CKD. Po4 improving  Nutrition: Alb low, continue renal diet + prot supps. T2DM - on insulin, management per PMD. HFpEF: UF as tolerated   Anthony Sar, MD [facility]

## 2023-01-12 NOTE — Progress Notes (Signed)
Physical Therapy Weekly Progress Note  Patient Details  Name: Kyle Roberson MRN: [id number] Date of Birth: Jan 01, 1941  Beginning of progress report period: January 05, 2023 End of progress report period: January 12, 2023  Today's Date: 01/12/2023 PT Individual Time: 0902-0959 PT Individual Time Calculation (min): 57 min   Patient has met 3 of 3 short term goals.  Pt is making great progress towards his long term goals. Pt is performing bed mobility with supervision with use of bed features, sit to stand and stand pivot transfer with min A from recliner, and CGA/supervision from WC and standard height chair with RW. Pt is performing stand pivot transfer and gait x 100 feet with RW and close supervision. Pt biggest deficit is the car transfer, as pt has [employer] and pt requires mod A onto car simulator seat. Plan to trial car transfer to pt [employer] on 10/24 with family training.   Patient continues to demonstrate the following deficits muscle weakness, decreased cardiorespiratoy endurance, and decreased standing balance and decreased balance strategies and therefore will continue to benefit from skilled PT intervention to increase functional independence with mobility.  Patient progressing toward long term goals..  Continue plan of care.  PT Short Term Goals Week 1:  PT Short Term Goal 1 (Week 1): pt will perform bed mobility with min A consistantly PT Short Term Goal 1 - Progress (Week 1): Met PT Short Term Goal 2 (Week 1): pt will transfer sit<>stand with LRAD and CGA consistantly PT Short Term Goal 2 - Progress (Week 1): Met PT Short Term Goal 3 (Week 1): pt will ambulate 18ft with LRAD and min A PT Short Term Goal 3 - Progress (Week 1): Met Week 2:  PT Short Term Goal 1 (Week 2): STG=LTG 2/2 ELOS  Skilled Therapeutic Interventions/Progress Updates:      Pt seated in recliner upon arrival. Pt agreeable to therapy. Pt reports 10/10 global pain, premedicated, however did  not interfere with pt participation in therapy.   Pt performed sit to stand with light min A from recliner with RW, verbal cues provided for technique.   Pt ambulated with RW and close supervision and performed ambulatory transfer on [facility], and BSC to recliner with close supervision, verbal cues provided for safety. Pt continent of bowel and bladder. Pt stood from [facility] with supervision, and stood with RW and supervision to perform pericare, pt required min A to donn pants around ankles 2/2 inability to bend over. Education provided for [employer]. Pt reports he has some at home.   Pt picked up cones x3 with reacher and RW with supervision, verbal cues provided for technique and use of unilateral UE on RW for stability.   Discussed practicing getting into [employer] on Thursday when pt wife Kyle Roberson comes for family training. Pt verbalized understanding and agreeable. Education provided for non emergency wheelchair transport. Pt refusing non emergency wheelchair transport at this time. Pt reports if unable to get in [employer], can use friends car.   Pt seated in recliner at end of session with all needs within reach and seatbelt alarm on.    Therapy Documentation Precautions:  Precautions Precautions: Fall Restrictions Weight Bearing Restrictions: No RLE Weight Bearing: Weight bearing as tolerated LLE Weight Bearing: Weight bearing as tolerated Therapy/Group: Individual Therapy  [facility] [place], [place], DPT  01/12/2023, 7:53 AM

## 2023-01-12 NOTE — Progress Notes (Signed)
PROGRESS NOTE   Subjective/Complaints: Dialysis scheduled for later today.  Initially came to talk to patient, patient indicated he did not know what was.  He indicated continued back pain due to mattress.  On speaking with him further patient did recognize me but said he did not want to talk to me or see me today-related to not being able to get his air mattress that he previously requested.  He also had a fall yesterday, had hip and knee x-rays that showed degenerative changes.  Patient working with therapy this morning.  I later received a message from therapy saying that he realizes that frustration may be misdirected and he is okay seeing me.  ROS: Patient denies fever, rash, sore throat, blurred vision, dizziness, nausea, vomiting, diarrhea, cough, shortness of breath or chest pain,  headache, or mood change.   + Back pain    Objective:   DG Knee 1-2 Views Right  Result Date: 01/11/2023 CLINICAL DATA:  Fall while doing PT, initial encounter EXAM: RIGHT KNEE - 2 VIEW COMPARISON:  None Available. FINDINGS: Tricompartmental degenerative changes are noted worst in the medial joint space. No acute fracture or dislocation is seen. No joint effusion is noted. IMPRESSION: No acute abnormality noted. Electronically Signed   By: Alcide Clever M.D.   On: 01/11/2023 21:51   DG HIP UNILAT WITH PELVIS 2-3 VIEWS LEFT  Result Date: 01/11/2023 CLINICAL DATA:  Recent fall in physical therapy with left hip pain, initial encounter EXAM: DG HIP (WITH OR WITHOUT PELVIS) 3V LEFT COMPARISON:  None Available. FINDINGS: Mild degenerative changes of left hip are noted. Vascular calcifications are seen. Pelvic ring is intact. No acute soft tissue abnormality is noted. IMPRESSION: No acute abnormality noted. Electronically Signed   By: Alcide Clever M.D.   On: 01/11/2023 21:51   DG HIP UNILAT WITH PELVIS 2-3 VIEWS RIGHT  Result Date: 01/11/2023 CLINICAL DATA:   Recent fall with right hip pain, initial encounter EXAM: DG HIP (WITH OR WITHOUT PELVIS) 2V RIGHT COMPARISON:  None Available. FINDINGS: No acute fracture or dislocation is noted. Mild degenerative changes are seen. Vascular calcifications are noted. IMPRESSION: No acute bony abnormality noted. Electronically Signed   By: Alcide Clever M.D.   On: 01/11/2023 21:50   DG Knee 1-2 Views Left  Result Date: 01/11/2023 CLINICAL DATA:  Recent fall in physical therapy with knee pain, initial encounter EXAM: LEFT KNEE - 2 VIEW COMPARISON:  None Available. FINDINGS: Tricompartmental degenerative changes are noted. No acute fracture or dislocation is seen. No joint effusion is noted. No soft tissue changes are noted. IMPRESSION: Degenerative change without acute abnormality Electronically Signed   By: Alcide Clever M.D.   On: 01/11/2023 21:49   Recent Labs    01/09/23 1345  WBC 10.3  HGB 10.7*  HCT 33.6*  PLT 192    Recent Labs    01/09/23 1345  NA 136  K 3.7  CL 95*  CO2 22  GLUCOSE 116*  BUN 79*  CREATININE 6.47*  CALCIUM 8.5*     Intake/Output Summary (Last 24 hours) at 01/12/2023 1327 Last data filed at 01/12/2023 8295 Gross per 24 hour  Intake 236 ml  Output --  Net 236 ml        Physical Exam: Vital Signs Blood pressure (!) 101/50, pulse (!) 56, temperature (!) 96.9 F (36.1 C), resp. rate (!) 24, height 5\' 8"  (1.727 m), weight 88.6 kg, SpO2 100%. Body mass index is 29.7 kg/m.  Mended exam this morning as patient requested me to leave  Constitutional: No distress . Vital signs reviewed. HEENT: NCAT, conjugate gaze, MMM Neck: supple Cardiovascular: No JVD Respiratory/Chest: Normal effort GI/Abdomen: Nondistended Ext: no clubbing, cyanosis, or edema Psych: Agitated skin: No evidence of breakdown, severe stasis dermatitis changes  TDC R chest wall  Neurologic: Awake and alert working with therapy.  Moving all 4 extremities to gravity  Musculoskeletal: joint  swelling  Assessment/Plan: 1. Functional deficits which require 3+ hours per day of interdisciplinary therapy in a comprehensive inpatient rehab setting. Physiatrist is providing close team supervision and 24 hour management of active medical problems listed below. Physiatrist and rehab team continue to assess barriers to discharge/monitor patient progress toward functional and medical goals  Care Tool:  Bathing    Body parts bathed by patient: Right arm, Left arm, Chest, Abdomen, Face, Right upper leg, Right lower leg   Body parts bathed by helper: Left upper leg, Left lower leg, Buttocks     Bathing assist Assist Level: Maximal Assistance - Patient 24 - 49%     Upper Body Dressing/Undressing Upper body dressing   What is the patient wearing?: Hospital gown only    Upper body assist Assist Level: Minimal Assistance - Patient > 75%    Lower Body Dressing/Undressing Lower body dressing      What is the patient wearing?: Underwear/pull up     Lower body assist Assist for lower body dressing: Moderate Assistance - Patient 50 - 74%     Toileting Toileting    Toileting assist Assist for toileting: Maximal Assistance - Patient 25 - 49%     Transfers Chair/bed transfer  Transfers assist     Chair/bed transfer assist level: Minimal Assistance - Patient > 75%     Locomotion Ambulation   Ambulation assist   Ambulation activity did not occur: Safety/medical concerns (fatigue, pain)  Assist level: Moderate Assistance - Patient 50 - 74% Assistive device: No Device Max distance: 50 feet   Walk 10 feet activity   Assist  Walk 10 feet activity did not occur: Safety/medical concerns (fatigue, pain)  Assist level: Moderate Assistance - Patient - 50 - 74% Assistive device: No Device   Walk 50 feet activity   Assist Walk 50 feet with 2 turns activity did not occur: Safety/medical concerns  Assist level: Moderate Assistance - Patient - 50 - 74% Assistive  device: No Device    Walk 150 feet activity   Assist Walk 150 feet activity did not occur: Safety/medical concerns         Walk 10 feet on uneven surface  activity   Assist Walk 10 feet on uneven surfaces activity did not occur: Safety/medical concerns (fatigue, pain)         Wheelchair     Assist Is the patient using a wheelchair?: Yes Type of Wheelchair: Manual Wheelchair activity did not occur: Safety/medical concerns (pain, fatigue)  Wheelchair assist level: Minimal Assistance - Patient > 75% Max wheelchair distance: 50    Wheelchair 50 feet with 2 turns activity    Assist    Wheelchair 50 feet with 2 turns activity did not occur: Safety/medical concerns (pain, fatigue)   Assist Level:  Minimal Assistance - Patient > 75%   Wheelchair 150 feet activity     Assist  Wheelchair 150 feet activity did not occur: Safety/medical concerns (pain, fatigue)   Assist Level: Maximal Assistance - Patient 25 - 49%   Blood pressure (!) 101/50, pulse (!) 56, temperature (!) 96.9 F (36.1 C), resp. rate (!) 24, height 5\' 8"  (1.727 m), weight 88.6 kg, SpO2 100%.  Medical Problem List and Plan: 1. Functional deficits secondary to CHF             -patient may not  shower, HD cath             -ELOS/Goals: 18-21d, pt  goal is home by Birthday on 10/29  -Continue CIR therapies including PT, OT   -Est Discharge 01/18/23  -Team conference tomorrow    2.  Antithrombotics: -DVT/anticoagulation:  Pharmaceutical: Eliquis             -antiplatelet therapy:    3. Pain Management: Tylenol as needed  -Discussed with nursing air mattress-was told he will not qualify  -K-pad ordered   4. Mood/Behavior/Sleep: LCSW to evaluate and provide emotional support             -antipsychotic agents: n/a   5. Neuropsych/cognition: This patient is  capable of making decisions on his  own behalf.   6. Skin/Wound Care: Routine skin care checks, Eucerin cream for stasis dermatitis    7. Fluids/Electrolytes/Nutrition: Strict Is and Os and follow-up chemistries per nephrology             -renal diet with FR             -continue vitamin C, D3, Renavit   8: Hypertension: monitor TID and prn             -continue doxazosin 1 mg BID-continue to hold             -continue Lasix 80 mg daily             -continue hydralazine 25 mg TID  -BP has been somewhat labile, continue to monitor trend.  Monitor for symptoms of orthostatic hypotension, particularly after hemodialysis.  -BP stable overall, order thigh high compression stockings to avoid orthostatic hypotension  -10/17 patient declined use of compression stockings yesterday, BP stable overall  -10/19 decrease hydralazine to 10 mg 3 times daily  -10/21 BP remains quite soft--hold hydralazine  -10/22 will discuss Lasix with nephrology, BP continues to be all soft      01/12/2023    1:22 PM 01/12/2023    6:20 AM 01/12/2023    1:39 AM  Vitals with BMI  Weight --    Systolic 101 107 409  Diastolic 50 49 49  Pulse 56 52 50     9: Hyperlipidemia: continue statin, Lovaza   10: Right eye infection probable blepharitis: continue Maxitrol 2 wk treatment    11: GERD: continue PPI   12: 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 hypoglycemia  -10/19 patient and wife were very concerned about hypoglycemia this morning, will dose increase Semglee to 14  units, consider increase mealtime insulin  -10/22 decrease Semglee to 12 units    CBG (last 3)  Recent Labs    01/12/23 0141 01/12/23 0614 01/12/23 1208  GLUCAP 143* 104* 166*       13: ESRD: HD on T/T/S via right internal jugular TDC  -Reviewed nephrology note-hemodialysis today completed   14: Chronic diastolic CHF: daily weight (dry weight ~220#)   EF is normal  -(meds as in #8) -fluid balance>>HD -10/21 weights fluctuate with HD  Filed Weights   01/09/23 1730 01/10/23 0500 01/11/23 0500  Weight: 85.6 kg 84.8 kg 88.6 kg      15: Atrial fibrillation: on Eliquis - bradycardia , not taking BBs monitor rate  -Avoid negative chronotropic medications  Heart rate stable continue to monitor   16: COPD/acute hypoxic respiratory failure due to volume overload:  -continue Breo Ellipta             -Wean supplemental O2 as able   17: CAD: on statin, meds as in #8   18: Obesity: BMI = 30.17  -Dietary education  -BMI a little improved to 29.7  Body mass index is 29.7 kg/m.    19: Anemia of chronic inflammation, cardiorenal disease ESRD: -continue Epogen 4000 units every TTS on hemodialysis days as per nephrology     20: Bilateral lower extremity lymphedema, chronic: -continue Lotrisone cream twice daily   21.  Fall.  -X-ray knees and hips without fracture or dislocation, arthritic changes noted  LOS: 8 days A FACE TO FACE EVALUATION WAS PERFORMED  Fanny Dance 01/12/2023, 1:27 PM

## 2023-01-12 NOTE — Plan of Care (Signed)
  Problem: Consults Goal: RH GENERAL PATIENT EDUCATION Description: See Patient Education module for education specifics. Outcome: Progressing Goal: Diabetes Guidelines if Diabetic/Glucose > 140 Description: If diabetic or lab glucose is > 140 mg/dl - Initiate Diabetes/Hyperglycemia Guidelines & Document Interventions  Outcome: Progressing   Problem: RH BOWEL ELIMINATION Goal: RH STG MANAGE BOWEL WITH ASSISTANCE Description: STG Manage Bowel with min Assistance. Outcome: Progressing   Problem: RH BLADDER ELIMINATION Goal: RH STG MANAGE BLADDER WITH ASSISTANCE Description: STG Manage Bladder With min Assistance Outcome: Progressing   Problem: RH SKIN INTEGRITY Goal: RH STG MAINTAIN SKIN INTEGRITY WITH ASSISTANCE Description: STG Maintain Skin Integrity With min Assistance. Outcome: Progressing   Problem: RH SAFETY Goal: RH STG ADHERE TO SAFETY PRECAUTIONS W/ASSISTANCE/DEVICE Description: STG Adhere to Safety Precautions With min Assistance/Device. Outcome: Progressing   Problem: RH PAIN MANAGEMENT Goal: RH STG PAIN MANAGED AT OR BELOW PT'S PAIN GOAL Description: Pain level at or below 4/10 Outcome: Progressing   Problem: RH KNOWLEDGE DEFICIT GENERAL Goal: RH STG INCREASE KNOWLEDGE OF SELF CARE AFTER HOSPITALIZATION Description: Patient and family will be able to demonstrate understanding current medical state, medications used for symptom management, dietary modifications and fluid restriction independently using handouts and booklets provided. Outcome: Progressing   Problem: Education: Goal: Understanding of CV disease, CV risk reduction, and recovery process will improve Outcome: Progressing Goal: Individualized Educational Video(s) Outcome: Progressing   Problem: Activity: Goal: Ability to return to baseline activity level will improve Outcome: Progressing   Problem: Cardiovascular: Goal: Ability to achieve and maintain adequate cardiovascular perfusion will  improve Outcome: Progressing Goal: Vascular access site(s) Level 0-1 will be maintained Outcome: Progressing   Problem: Health Behavior/Discharge Planning: Goal: Ability to safely manage health-related needs after discharge will improve Outcome: Progressing   Problem: Education: Goal: Knowledge of General Education information will improve Description: Including pain rating scale, medication(s)/side effects and non-pharmacologic comfort measures Outcome: Progressing   Problem: Health Behavior/Discharge Planning: Goal: Ability to manage health-related needs will improve Outcome: Progressing   Problem: Activity: Goal: Risk for activity intolerance will decrease Outcome: Progressing   Problem: Nutrition: Goal: Adequate nutrition will be maintained Outcome: Progressing   Problem: Coping: Goal: Level of anxiety will decrease Outcome: Progressing   Problem: Elimination: Goal: Will not experience complications related to bowel motility Outcome: Progressing Goal: Will not experience complications related to urinary retention Outcome: Progressing   Problem: Pain Managment: Goal: General experience of comfort will improve Outcome: Progressing   Problem: Safety: Goal: Ability to remain free from injury will improve Outcome: Progressing   Problem: Skin Integrity: Goal: Risk for impaired skin integrity will decrease Outcome: Progressing

## 2023-01-12 NOTE — Progress Notes (Signed)
   01/12/23 1650  Vitals  Temp 98.1 F (36.7 C)  Pulse Rate 61  Resp (!) 25  BP (!) 89/52  SpO2 99 %  Post Treatment  Dialyzer Clearance Lightly streaked  Hemodialysis Intake (mL) 0 mL  Liters Processed 57.1  Fluid Removed (mL) 1200 mL  Tolerated HD Treatment Yes   Received patient in bed to unit.  Alert and oriented.  Informed consent signed and in chart.   TX duration:2hr  Patient tolerated well.  Transported back to the room  Alert, without acute distress.  Hand-off given to patient's nurse.   Access used: [facility] Access issues: none  Total UF removed: 1.2L Medication(s) given: none    [facility]

## 2023-01-12 NOTE — Progress Notes (Signed)
Pt has been re-approved for [facility] TTS 6:45 am chair time. Pt will need to arrive at 6:30 am for first appt. [facility] advised of pt's target d/c date of Monday, Oct 28 and that pt will hopefully start on Oct 29. Attempted to reach pt's wife via phone to provide an update but unable to reach wife and unable to leave a message. Will try back later today. Update provided to nephrologist and CSW. Will assist as needed.   Olivia Canter Renal Navigator [phone]

## 2023-01-12 NOTE — Progress Notes (Signed)
Occupational Therapy Session Note  Patient Details  Name: Kyle Roberson MRN: [id number] Date of Birth: 1940-06-18  Today's Date: 01/12/2023 OT Individual Time: 7829-5621 OT Individual Time Calculation (min): 56 min   Today's Date: 01/12/2023 OT Individual Time: 1101-1200 OT Individual Time Calculation (min): 59 min  and Today's Date: 01/12/2023 OT Missed Time: 10 Minutes Missed Time Reason: Other (comment) (Missed minutes due to delay in care.)  Short Term Goals: Week 2:  OT Short Term Goal 1 (Week 2): STGs=LTGs due to patient's length of stay.  Skilled Therapeutic Interventions/Progress Updates:   Session 1: Pt received sitting in recliner for skilled OT session with focus on functional transfers. Pt agreeable to interventions, demonstrating overall pleasant mood. Pt reported 10/10 buttock pain, medicated during session. OT offering intermediate rest breaks and positioning suggestions throughout session to address pain/fatigue and maximize participation/safety in session.   Time dedicated at beginning of session to coordinate/verify caregiver education date of Thursday 10/22, as PT attempted to schedule additional training for this date, spouse unavailable.   Pt and OT discuss home bathroom layout, discussing PLOF for walk-in shower transfers. Unsure if bathroom is RW accessible, plan to inquire during family education as patient is at times a questionable historian. Pt receptive to attempting simulated walk-in shower transfer in room, completing utilizing backward-step technique for optimal UE support, pt completes with CGA + RW, then transitioning to CGA--HHA provided in place of grab bar, sitting onto armless "shower seat" with same level of assistance. Pt requires Min A + unilateral support (Min HHA) to rise from "shower seat," exiting in same fashion. Pt states he will not "get in the shower that way," because "there won't be any room with that walker." Re-education provided  fall risk and need for mobility device, pt not receptive this date.   Pt performing at same level of functioning as demonstrated prior to fall with this therapist. No increased discomfort noted during session at contact locations including L-knee, patient pleased with access to thorough x-rays.    Pt remained sitting in recliner with all immediate needs met at end of session. Pt continues to be appropriate for skilled OT intervention to promote further functional independence.   Session 2: Pt received sitting in [facility] for skilled OT session with focus on standing and seated balance. Pt agreeable to interventions, demonstrating overall pleasant mood. Pt with un-rated pain in buttocks, OT offering intermediate rest breaks and positioning suggestions throughout session to address pain/fatigue and maximize participation/safety in session.   Pt defers use of small step this session for conditioning and in preparation for car transfer, due to fear of falling. Session than focused on static/dynamic standing utilizing table-stop activities. Pt tolerates only one round of activity, ~2 mins of standing with close supervision. Pt requesting to complete remainder of session at seated level. OT retrieved 1# wrist weights to increase challenge with dynamic seated reach activity. Pt with increased reports of low back pain, resting intermediately throughout, declining use of K-pad to address pain.   Pt missing ~15 minutes of skilled intervention due delay of care.  Pt remained recliner with all immediate needs met at end of session. Pt continues to be appropriate for skilled OT intervention to promote further functional independence.   Therapy Documentation Precautions:  Precautions Precautions: Fall Restrictions Weight Bearing Restrictions: No RLE Weight Bearing: Weight bearing as tolerated LLE Weight Bearing: Weight bearing as tolerated   Therapy/Group: Individual Therapy  Lou Cal, OTR/L,  MSOT  01/12/2023, 5:48 AM

## 2023-01-13 LAB — GLUCOSE, CAPILLARY
Glucose-Capillary: 75 mg/dL (ref 70–99)
Glucose-Capillary: 132 mg/dL — ABNORMAL HIGH (ref 70–99)
Glucose-Capillary: 104 mg/dL — ABNORMAL HIGH (ref 70–99)
Glucose-Capillary: 149 mg/dL — ABNORMAL HIGH (ref 70–99)

## 2023-01-13 MED ORDER — INSULIN GLARGINE-YFGN 100 UNIT/ML ~~LOC~~ SOLN
9.0000 [IU] | Freq: Every day | SUBCUTANEOUS | Status: DC
  Filled 2023-01-13 (×2): qty 0.09

## 2023-01-13 MED ORDER — INSULIN ASPART 100 UNIT/ML IJ SOLN
2.0000 [IU] | Freq: Three times a day (TID) | INTRAMUSCULAR | Status: DC
  Administered 2023-01-13 – 2023-01-15 (×3): 2 [IU] via SUBCUTANEOUS

## 2023-01-13 NOTE — Progress Notes (Signed)
Physical Therapy Session Note  Patient Details  Name: Kyle Roberson MRN: [id number] Date of Birth: 1940-08-04  Today's Date: 01/13/2023 PT Individual Time: 1053-1204 PT Individual Time Calculation (min): 71 min   Short Term Goals: Week 2:  PT Short Term Goal 1 (Week 2): STG=LTG 2/2 ELOS  Skilled Therapeutic Interventions/Progress Updates:    Pt presents in room in recliner, agreeable to PT with encouragement. Pt perseverative on not wanting to practice car simulator. Session focused on thearpeutic activities to encourage participation with therapies as well as gait training to promote tolerance to upright as well as BLE strengthening for step negotiation.  Pt initially agitated about safety of car simulator at start of session, requires increased time and frequent redirection to encourage participation with therapy session out of room. Therapist assist to dons shoes total assist, pt reporting pain in R foot with donning shoes and upon assessment pt toenail noted to be bleeding. RN notified and present to clean and dress at start of session. Pt educated during this time on foot hygiene for wound prevention and encouraged to seek follow up with podiatry.  Pt completes ambulatory transfer with RW CGA with increased time, transported from room to main gym dependently for time management. Pt ambulates 190' with RW CGA, demonstrates decreased proximity to RW with fatigue however no LOB.  Pt completes forward/backward ambulation with RW 3x10' with verbal cues for sequencing, completed to promote multidirectional stepping stability in preparation for car transfer with running board step into car.  Pt then completes multidirectional step ups on 3" step for gait training and BLE strengthening needed for step negotiation including: - forward step ups x5 BLE - lateral step ups x5 BLE - backward step ups x5 BLE Pt requires rest breaks between each trial and demonstrates CGA for forward/lateral step  ups, requires min/mod assist for backwards step up with pt demonstrating R knee buckle with stepping up backwards with fatigue requiring mod assist to regain balance.  Pt provided with seated rest breaks between all gait trials and exercises to promote energy conservation and quality with tasks. Pt is verbose and requires increased time for redirection for all tasks.  Pt returns to room, completes ambulatory transfer back to recliner and remains seated in recliner with all needs within reach, cal light in place and chair alarm donned and activated at end of session.   Therapy Documentation Precautions:  Precautions Precautions: Fall Restrictions Weight Bearing Restrictions: No RLE Weight Bearing: Weight bearing as tolerated LLE Weight Bearing: Weight bearing as tolerated    Therapy/Group: Individual Therapy  Edwin Cap PT, DPT 01/13/2023, 3:03 PM

## 2023-01-13 NOTE — Progress Notes (Signed)
Spoke with pt's wife this morning via phone regarding pt's HD appt times. Wife states that she feels pt will do better with a 2nd shift appt. Contacted [facility] admissions to request a 2nd shift appt at d/c if possible. Awaiting a return call. Will update staff as needed and when info is received.    Olivia Canter Renal Navigator [phone]

## 2023-01-13 NOTE — Progress Notes (Signed)
PROGRESS NOTE   Subjective/Complaints: Patient changes mind regarding may continue his care, he is agreeable for this currently.  Patient got a air mattress, reports he slept better and his back is feeling better.  No new concerns.  Reports Tylenol is keeping his pain under control.  ROS: Patient denies fever, rash, sore throat, blurred vision, dizziness, nausea, vomiting, diarrhea, cough, shortness of breath or chest pain,  headache, or mood change.   + Back pain-improved Insomnia-improved    Objective:   DG Knee 1-2 Views Right  Result Date: 01/11/2023 CLINICAL DATA:  Fall while doing PT, initial encounter EXAM: RIGHT KNEE - 2 VIEW COMPARISON:  None Available. FINDINGS: Tricompartmental degenerative changes are noted worst in the medial joint space. No acute fracture or dislocation is seen. No joint effusion is noted. IMPRESSION: No acute abnormality noted. Electronically Signed   By: Alcide Clever M.D.   On: 01/11/2023 21:51   DG HIP UNILAT WITH PELVIS 2-3 VIEWS LEFT  Result Date: 01/11/2023 CLINICAL DATA:  Recent fall in physical therapy with left hip pain, initial encounter EXAM: DG HIP (WITH OR WITHOUT PELVIS) 3V LEFT COMPARISON:  None Available. FINDINGS: Mild degenerative changes of left hip are noted. Vascular calcifications are seen. Pelvic ring is intact. No acute soft tissue abnormality is noted. IMPRESSION: No acute abnormality noted. Electronically Signed   By: Alcide Clever M.D.   On: 01/11/2023 21:51   DG HIP UNILAT WITH PELVIS 2-3 VIEWS RIGHT  Result Date: 01/11/2023 CLINICAL DATA:  Recent fall with right hip pain, initial encounter EXAM: DG HIP (WITH OR WITHOUT PELVIS) 2V RIGHT COMPARISON:  None Available. FINDINGS: No acute fracture or dislocation is noted. Mild degenerative changes are seen. Vascular calcifications are noted. IMPRESSION: No acute bony abnormality noted. Electronically Signed   By: Alcide Clever M.D.    On: 01/11/2023 21:50   DG Knee 1-2 Views Left  Result Date: 01/11/2023 CLINICAL DATA:  Recent fall in physical therapy with knee pain, initial encounter EXAM: LEFT KNEE - 2 VIEW COMPARISON:  None Available. FINDINGS: Tricompartmental degenerative changes are noted. No acute fracture or dislocation is seen. No joint effusion is noted. No soft tissue changes are noted. IMPRESSION: Degenerative change without acute abnormality Electronically Signed   By: Alcide Clever M.D.   On: 01/11/2023 21:49   No results for input(s): "WBC", "HGB", "HCT", "PLT" in the last 72 hours.   No results for input(s): "NA", "K", "CL", "CO2", "GLUCOSE", "BUN", "CREATININE", "CALCIUM" in the last 72 hours.    Intake/Output Summary (Last 24 hours) at 01/13/2023 1244 Last data filed at 01/12/2023 1650 Gross per 24 hour  Intake --  Output 1200 ml  Net -1200 ml        Physical Exam: Vital Signs Blood pressure (!) 119/54, pulse (!) 49, temperature 98 F (36.7 C), resp. rate 18, height 5\' 8"  (1.727 m), weight 88.6 kg, SpO2 97%. Body mass index is 29.7 kg/m.  Constitutional: No distress . Vital signs reviewed. HEENT: NCAT, conjugate gaze, MMM Neck: supple Cardiovascular: RRR Respiratory/Chest: Normal effort, CTAB GI/Abdomen: Nondistended, soft, positive bowel sounds Ext: no clubbing, cyanosis, or edema Psych: Pleasant skin: No evidence of breakdown, severe stasis  dermatitis changes  TDC R chest wall  Neurologic: Awake and alert working with therapy.  Moving all 4 extremities to gravity  Musculoskeletal: No joint swelling  Assessment/Plan: 1. Functional deficits which require 3+ hours per day of interdisciplinary therapy in a comprehensive inpatient rehab setting. Physiatrist is providing close team supervision and 24 hour management of active medical problems listed below. Physiatrist and rehab team continue to assess barriers to discharge/monitor patient progress toward functional and medical  goals  Care Tool:  Bathing    Body parts bathed by patient: Right arm, Left arm, Chest, Abdomen, Face, Right upper leg, Right lower leg   Body parts bathed by helper: Left upper leg, Left lower leg, Buttocks     Bathing assist Assist Level: Maximal Assistance - Patient 24 - 49%     Upper Body Dressing/Undressing Upper body dressing   What is the patient wearing?: Hospital gown only    Upper body assist Assist Level: Minimal Assistance - Patient > 75%    Lower Body Dressing/Undressing Lower body dressing      What is the patient wearing?: Underwear/pull up     Lower body assist Assist for lower body dressing: Moderate Assistance - Patient 50 - 74%     Toileting Toileting    Toileting assist Assist for toileting: Maximal Assistance - Patient 25 - 49%     Transfers Chair/bed transfer  Transfers assist     Chair/bed transfer assist level: Minimal Assistance - Patient > 75%     Locomotion Ambulation   Ambulation assist   Ambulation activity did not occur: Safety/medical concerns (fatigue, pain)  Assist level: Moderate Assistance - Patient 50 - 74% Assistive device: No Device Max distance: 50 feet   Walk 10 feet activity   Assist  Walk 10 feet activity did not occur: Safety/medical concerns (fatigue, pain)  Assist level: Moderate Assistance - Patient - 50 - 74% Assistive device: No Device   Walk 50 feet activity   Assist Walk 50 feet with 2 turns activity did not occur: Safety/medical concerns  Assist level: Moderate Assistance - Patient - 50 - 74% Assistive device: No Device    Walk 150 feet activity   Assist Walk 150 feet activity did not occur: Safety/medical concerns         Walk 10 feet on uneven surface  activity   Assist Walk 10 feet on uneven surfaces activity did not occur: Safety/medical concerns (fatigue, pain)         Wheelchair     Assist Is the patient using a wheelchair?: Yes Type of Wheelchair:  Manual Wheelchair activity did not occur: Safety/medical concerns (pain, fatigue)  Wheelchair assist level: Minimal Assistance - Patient > 75% Max wheelchair distance: 50    Wheelchair 50 feet with 2 turns activity    Assist    Wheelchair 50 feet with 2 turns activity did not occur: Safety/medical concerns (pain, fatigue)   Assist Level: Minimal Assistance - Patient > 75%   Wheelchair 150 feet activity     Assist  Wheelchair 150 feet activity did not occur: Safety/medical concerns (pain, fatigue)   Assist Level: Maximal Assistance - Patient 25 - 49%   Blood pressure (!) 119/54, pulse (!) 49, temperature 98 F (36.7 C), resp. rate 18, height 5\' 8"  (1.727 m), weight 88.6 kg, SpO2 97%.  Medical Problem List and Plan: 1. Functional deficits secondary to CHF             -patient may not  shower, HD cath             -  ELOS/Goals: 18-21d, pt  goal is home by Birthday on 10/29  -Continue CIR therapies including PT, OT   -Est Discharge 01/18/23  -Team conference today please see physician documentation under team conference tab, met with team  to discuss problems,progress, and goals. Formulized individual treatment plan based on medical history, underlying problem and comorbidities.      2.  Antithrombotics: -DVT/anticoagulation:  Pharmaceutical: Eliquis             -antiplatelet therapy:    3. Pain Management: Tylenol as needed  -Discussed with nursing air mattress-was told he will not qualify  -K-pad ordered   4. Mood/Behavior/Sleep: LCSW to evaluate and provide emotional support             -antipsychotic agents: n/a   5. Neuropsych/cognition: This patient is  capable of making decisions on his  own behalf.   6. Skin/Wound Care: Routine skin care checks, Eucerin cream for stasis dermatitis   7. Fluids/Electrolytes/Nutrition: Strict Is and Os and follow-up chemistries per nephrology             -renal diet with FR             -continue vitamin C, D3, Renavit   8:  Hypertension: monitor TID and prn             -continue doxazosin 1 mg BID-continue to hold             -continue Lasix 80 mg daily             -continue hydralazine 25 mg TID  -BP has been somewhat labile, continue to monitor trend.  Monitor for symptoms of orthostatic hypotension, particularly after hemodialysis.  -BP stable overall, order thigh high compression stockings to avoid orthostatic hypotension  -10/17 patient declined use of compression stockings yesterday, BP stable overall  -10/19 decrease hydralazine to 10 mg 3 times daily  -10/21 BP remains quite soft--hold hydralazine  -10/22 will discuss Lasix with nephrology, BP continues to be all soft   -10/23 Lasix continued after discussion with nephrology, monitor fluid status    01/13/2023    6:43 AM 01/12/2023    8:42 PM 01/12/2023    4:55 PM  Vitals with BMI  Systolic 119 98 96  Diastolic 54 51 51  Pulse 49 69 64     9: Hyperlipidemia: continue statin, Lovaza   10: Right eye infection probable blepharitis: continue Maxitrol 2 wk treatment    11: GERD: continue PPI   12: 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 hypoglycemia  -10/19 patient and wife were very concerned about hypoglycemia this morning, will dose increase Semglee to 14 units, consider increase mealtime insulin  -10/22 decrease Semglee to 12 units  -10/23 decrease Semglee to 9 units today, decrease NovoLog to 2 units with meals to reduce risk of hypoglycemia    CBG (last 3)  Recent Labs    01/12/23 2102 01/13/23 0639 01/13/23 1204  GLUCAP 79 75 132*       13: ESRD: HD on T/T/S via right internal jugular [facility]  -Reviewed nephrology note-next HD  10/24   14: Chronic diastolic CHF: daily weight (dry weight ~220#)  EF is normal  -(meds  as in #8) -fluid balance>>HD -10/21 weights fluctuate with HD  Filed Weights   01/10/23 0500 01/11/23 0500  Weight: 84.8 kg 88.6 kg      15: Atrial fibrillation: on Eliquis - bradycardia , not taking BBs monitor rate  -Avoid negative chronotropic medications  Heart rate stable continue to monitor   16: COPD/acute hypoxic respiratory failure due to volume overload:  -continue Breo Ellipta             -Wean supplemental O2 as able   17: CAD: on statin, meds as in #8   18: Obesity: BMI = 30.17  -Dietary education  -BMI a little improved to 29.7  Body mass index is 29.7 kg/m.    19: Anemia of chronic inflammation, cardiorenal disease ESRD: -continue Epogen 4000 units every TTS on hemodialysis days as per nephrology     20: Bilateral lower extremity lymphedema, chronic: -continue Lotrisone cream twice daily   21.  Fall.  -X-ray knees and hips without fracture or dislocation, arthritic changes noted  LOS: 9 days A FACE TO FACE EVALUATION WAS PERFORMED  Fanny Dance 01/13/2023, 12:44 PM

## 2023-01-13 NOTE — Patient Care Conference (Signed)
Inpatient RehabilitationTeam Conference and Plan of Care Update Date: 01/13/2023   Time: 12:19 PM    Patient Name: Kyle Roberson      Medical Record Number: [id number]  Date of Birth: Oct 22, 1940 Sex: Male         Room/Bed: 4W15C/4W15C-01 Payor Info: Payor: [employer] MEDICARE / Plan: [employer] MEDICARE HMO/PPO / Product Type: *No Product type* /    Admit Date/Time:  01/04/2023  3:02 PM  Primary Diagnosis:  Debility  Hospital Problems: Principal Problem:   Debility    Expected Discharge Date: Expected Discharge Date: 01/18/23  Team Members Present: Physician leading conference: Dr. Fanny Dance Social Worker Present: Dossie Der, LCSW Nurse Present: Chana Bode, RN PT Present: Raechel Chute, PT OT Present: Lou Cal, OT PPS Coordinator present : Fae Pippin, SLP     Current Status/Progress Goal Weekly Team Focus  Bowel/Bladder   Pt is continent of b/b   Remain continent   Assist with toileting qshift and prn    Swallow/Nutrition/ Hydration               ADL's   Mod I for UB ADLs and seated sink-side grooming, Min A for LB ADLs with use of AE (owns a Sports administrator), Supervision for toileting   Min A LB, S UB and functional transfers   Family education and discharge planning    Mobility   bed mobility mod I, pt at goal level (supervision) for sit to stand, stand pivot transfer and gait x100 feet with RW. pt biggest barrier is car transfer. Pt has [employer], [employer], pt required mod A for posterior scoot with no step stool into car simulator, pt had fall with OT with backwards step onto step stool with RW 2/2 knee buckling. Plan to perform car transfer with wife in pt [employer] on thursday with family training   supervision/mod I  barriers: car transfer, family training scheduled for 10/24    Communication                Safety/Cognition/ Behavioral Observations               Pain   no c/o of pain   Remain pain free   Assess pain qshift and prn     Skin   Bilateral lower extremities lymphodema, skin intact   Maintain skin intergrity  Assess qhshift and prn      Discharge Planning:  Wife will be here tomorrow to do hands on education-aware of husband's need for 24/7 care. Pt tends to do what he wants to do. Awaiting team;s recommendations for DME and HH vs OP   Team Discussion: Patient with back pain post debility.  Patient on target to meet rehab goals: yes, currently needs supervision for sit- stand and stand pivot transfers using a RW. Able to ambulate up to 100' using a RW.  Needs mod I for upper body care.   *See Care Plan and progress notes for long and short-term goals.   Revisions to Treatment Plan:  Car transfer HD set up   Teaching Needs: Safety, medications, dietary modifications, transfers, toileting, etc.   Current Barriers to Discharge: Home enviroment access/layout and Hemodialysis  Possible Resolutions to Barriers: Family education HH follow up services DME: TTB     Medical Summary Current Status: Debility, CHF, HTN, Hypotension, DM2, Afib, CAD, ANemia, Fall, ESRD. Back pain  Barriers to Discharge: Cardiac Complications;Hypotension;Morbid Obesity;Medical stability  Barriers to Discharge Comments: Debility, CHF, HTN, Hypotension, DM2, Afib, CAD, ANemia, Fall, ESRD. Back  pain Possible Resolutions to [employer] Focus: hemodialysis set up, air mattress for back pain, monitor BP-lasix stopped   Continued Need for Acute Rehabilitation Level of Care: The patient requires daily medical management by a physician with specialized training in physical medicine and rehabilitation for the following reasons: Direction of a multidisciplinary physical rehabilitation program to maximize functional independence : Yes Medical management of patient stability for increased activity during participation in an intensive rehabilitation regime.: Yes Analysis of laboratory values and/or radiology reports with any  subsequent need for medication adjustment and/or medical intervention. : Yes   I attest that I was present, lead the team conference, and concur with the assessment and plan of the team.   Chana Bode B 01/13/2023, 3:49 PM

## 2023-01-13 NOTE — Progress Notes (Signed)
[facility] Progress Note   Subjective:   Patient seen and examined at bedside. Had low BP with HD yesterday. Net uf 1.2L. Using air mattress which he likes a lot better  Objective Vitals:   01/12/23 1650 01/12/23 1655 01/12/23 2042 01/13/23 0643  BP: (!) 89/52 (!) 96/51 (!) 98/51 (!) 119/54  Pulse: 61 64 69 (!) 49  Resp: (!) 25 16 17 18   Temp: 98.1 F (36.7 C)  98.8 F (37.1 C) 98 F (36.7 C)  TempSrc:      SpO2: 99% 96% 100% 97%  Weight:      Height:       Physical Exam General:chronically ill appearing male in NAD, laying in bed Heart:RRR, no mrg Lungs:CTAB anteriorly Abdomen:soft, ND Extremities:chronic skin changes, no LE edema Neuro: awake, alert Dialysis Access: [facility]   Filed Weights   01/10/23 0500 01/11/23 0500  Weight: 84.8 kg 88.6 kg    Intake/Output Summary (Last 24 hours) at 01/13/2023 0906 Last data filed at 01/12/2023 1650 Gross per 24 hour  Intake --  Output 1200 ml  Net -1200 ml    Additional Objective Labs: Basic Metabolic Panel: Recent Labs  Lab 01/07/23 1452 01/09/23 1345  NA 134* 136  K 4.0 3.7  CL 96* 95*  CO2 21* 22  GLUCOSE 171* 116*  BUN 81* 79*  CREATININE 7.43* 6.47*  CALCIUM 8.1* 8.5*  PHOS 7.6* 5.7*   Liver Function Tests: Recent Labs  Lab 01/07/23 1452 01/09/23 1345  ALBUMIN 3.0* 2.9*   CBC: Recent Labs  Lab 01/07/23 1452 01/09/23 1345  WBC 10.1 10.3  HGB 11.7* 10.7*  HCT 36.7* 33.6*  MCV 93.4 91.3  PLT 211 192   CBG: Recent Labs  Lab 01/12/23 0614 01/12/23 1208 01/12/23 1726 01/12/23 2102 01/13/23 0639  GLUCAP 104* 166* 107* 79 75   Iron Studies:  No results for input(s): "IRON", "TIBC", "TRANSFERRIN", "FERRITIN" in the last 72 hours.  Lab Results  Component Value Date   INR 1.5 (H) 12/20/2022   INR 1.2 12/07/2022   INR 1.6 (H) 12/03/2022   Studies/Results: DG Knee 1-2 Views Right  Result Date: 01/11/2023 CLINICAL DATA:  Fall while doing PT, initial encounter EXAM: RIGHT  KNEE - 2 VIEW COMPARISON:  None Available. FINDINGS: Tricompartmental degenerative changes are noted worst in the medial joint space. No acute fracture or dislocation is seen. No joint effusion is noted. IMPRESSION: No acute abnormality noted. Electronically Signed   By: Alcide Clever M.D.   On: 01/11/2023 21:51   DG HIP UNILAT WITH PELVIS 2-3 VIEWS LEFT  Result Date: 01/11/2023 CLINICAL DATA:  Recent fall in physical therapy with left hip pain, initial encounter EXAM: DG HIP (WITH OR WITHOUT PELVIS) 3V LEFT COMPARISON:  None Available. FINDINGS: Mild degenerative changes of left hip are noted. Vascular calcifications are seen. Pelvic ring is intact. No acute soft tissue abnormality is noted. IMPRESSION: No acute abnormality noted. Electronically Signed   By: Alcide Clever M.D.   On: 01/11/2023 21:51   DG HIP UNILAT WITH PELVIS 2-3 VIEWS RIGHT  Result Date: 01/11/2023 CLINICAL DATA:  Recent fall with right hip pain, initial encounter EXAM: DG HIP (WITH OR WITHOUT PELVIS) 2V RIGHT COMPARISON:  None Available. FINDINGS: No acute fracture or dislocation is noted. Mild degenerative changes are seen. Vascular calcifications are noted. IMPRESSION: No acute bony abnormality noted. Electronically Signed   By: Alcide Clever M.D.   On: 01/11/2023 21:50   DG Knee 1-2 Views Left  Result  Date: 01/11/2023 CLINICAL DATA:  Recent fall in physical therapy with knee pain, initial encounter EXAM: LEFT KNEE - 2 VIEW COMPARISON:  None Available. FINDINGS: Tricompartmental degenerative changes are noted. No acute fracture or dislocation is seen. No joint effusion is noted. No soft tissue changes are noted. IMPRESSION: Degenerative change without acute abnormality Electronically Signed   By: Alcide Clever M.D.   On: 01/11/2023 21:49    Medications:   (feeding supplement) PROSource Plus  30 mL Oral BID BM   apixaban  2.5 mg Oral BID   vitamin C  500 mg Oral BID   Chlorhexidine Gluconate Cloth  6 each Topical Q12H    cholecalciferol  2,000 Units Oral Daily   clotrimazole-betamethasone   Topical BID   docusate sodium  100 mg Oral BID   fluticasone furoate-vilanterol  1 puff Inhalation Daily   hydrocerin   Topical TID   insulin aspart  0-15 Units Subcutaneous TID AC & HS   insulin aspart  3 Units Subcutaneous TID WC   insulin glargine-yfgn  12 Units Subcutaneous Daily   isosorbide mononitrate  60 mg Oral Daily   melatonin  5 mg Oral QHS   multivitamin  1 tablet Oral QHS   neomycin-polymyxin b-dexamethasone  1 Application Right Eye TID   omega-3 acid ethyl esters  1 g Oral Daily   pantoprazole  40 mg Oral Daily   rosuvastatin  10 mg Oral Once per day on Monday Wednesday Friday   sevelamer carbonate  1,600 mg Oral TID WC    Dialysis Orders: Doyne Barcroft is a 82 y.o. 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    Dialysis Orders: Has been accepted to [facility] on TTS per notes (will start after CIR d/c) Establishing orders.    Assessment/Plan: Debility: Admitted to CIR for ongoing rehab. ESRD:  Continue HD per TTS schedule - Next HD 10/24.  Using [facility]. Have not pursued permanent access at this time, may be hindrance to his therapy sessions to do inpatient, can revisit as an outpatient. His eventual goal is to be able to do renal replacement therapy at home Hypertension/volume: BP variable, abdominal distention improving, continue UF as tolerated.  Establishing dry weight.  Report standing for weights in HD. Anemia of ESRD: no ESA needs at this time. Tsat 19%.  If Hgb starts to drop will order IV iron course. Hgb 10.7 Metabolic bone disease: Renvela 2tabs AC.  Pth 344, in goal for stage of CKD. Last Po4 improving  Nutrition: Alb low, continue renal diet + prot supps. T2DM - management per primary service HFpEF: UF as tolerated, off-HD days d/c'ed given low BPs   Anthony Sar, MD [facility]

## 2023-01-13 NOTE — Progress Notes (Incomplete)
PROGRESS NOTE   Subjective/Complaints: Dialysis scheduled for later today.  Initially came to talk to patient, patient indicated he did not know what was.  He indicated continued back pain due to mattress.  On speaking with him further patient did recognize me but said he did not want to talk to me or see me today-related to not being able to get his air mattress that he previously requested.  He also had a fall yesterday, had hip and knee x-rays that showed degenerative changes.  Patient working with therapy this morning.  I later received a message from therapy saying that he realizes that frustration may be misdirected and he is okay seeing me.  ROS: Patient denies fever, rash, sore throat, blurred vision, dizziness, nausea, vomiting, diarrhea, cough, shortness of breath or chest pain,  headache, or mood change.   + Back pain    Objective:   DG Knee 1-2 Views Right  Result Date: 01/11/2023 CLINICAL DATA:  Fall while doing PT, initial encounter EXAM: RIGHT KNEE - 2 VIEW COMPARISON:  None Available. FINDINGS: Tricompartmental degenerative changes are noted worst in the medial joint space. No acute fracture or dislocation is seen. No joint effusion is noted. IMPRESSION: No acute abnormality noted. Electronically Signed   By: Alcide Clever M.D.   On: 01/11/2023 21:51   DG HIP UNILAT WITH PELVIS 2-3 VIEWS LEFT  Result Date: 01/11/2023 CLINICAL DATA:  Recent fall in physical therapy with left hip pain, initial encounter EXAM: DG HIP (WITH OR WITHOUT PELVIS) 3V LEFT COMPARISON:  None Available. FINDINGS: Mild degenerative changes of left hip are noted. Vascular calcifications are seen. Pelvic ring is intact. No acute soft tissue abnormality is noted. IMPRESSION: No acute abnormality noted. Electronically Signed   By: Alcide Clever M.D.   On: 01/11/2023 21:51   DG HIP UNILAT WITH PELVIS 2-3 VIEWS RIGHT  Result Date: 01/11/2023 CLINICAL DATA:   Recent fall with right hip pain, initial encounter EXAM: DG HIP (WITH OR WITHOUT PELVIS) 2V RIGHT COMPARISON:  None Available. FINDINGS: No acute fracture or dislocation is noted. Mild degenerative changes are seen. Vascular calcifications are noted. IMPRESSION: No acute bony abnormality noted. Electronically Signed   By: Alcide Clever M.D.   On: 01/11/2023 21:50   DG Knee 1-2 Views Left  Result Date: 01/11/2023 CLINICAL DATA:  Recent fall in physical therapy with knee pain, initial encounter EXAM: LEFT KNEE - 2 VIEW COMPARISON:  None Available. FINDINGS: Tricompartmental degenerative changes are noted. No acute fracture or dislocation is seen. No joint effusion is noted. No soft tissue changes are noted. IMPRESSION: Degenerative change without acute abnormality Electronically Signed   By: Alcide Clever M.D.   On: 01/11/2023 21:49   No results for input(s): "WBC", "HGB", "HCT", "PLT" in the last 72 hours.   No results for input(s): "NA", "K", "CL", "CO2", "GLUCOSE", "BUN", "CREATININE", "CALCIUM" in the last 72 hours.    Intake/Output Summary (Last 24 hours) at 01/13/2023 1222 Last data filed at 01/12/2023 1650 Gross per 24 hour  Intake --  Output 1200 ml  Net -1200 ml        Physical Exam: Vital Signs Blood pressure (!) 119/54, pulse Marland Kitchen)  49, temperature 98 F (36.7 C), resp. rate 18, height 5\' 8"  (1.727 m), weight 88.6 kg, SpO2 97%. Body mass index is 29.7 kg/m.  Mended exam this morning as patient requested me to leave  Constitutional: No distress . Vital signs reviewed. HEENT: NCAT, conjugate gaze, MMM Neck: supple Cardiovascular: No JVD Respiratory/Chest: Normal effort GI/Abdomen: Nondistended Ext: no clubbing, cyanosis, or edema Psych: Agitated skin: No evidence of breakdown, severe stasis dermatitis changes  TDC R chest wall  Neurologic: Awake and alert working with therapy.  Moving all 4 extremities to gravity  Musculoskeletal: joint swelling  Assessment/Plan: 1.  Functional deficits which require 3+ hours per day of interdisciplinary therapy in a comprehensive inpatient rehab setting. Physiatrist is providing close team supervision and 24 hour management of active medical problems listed below. Physiatrist and rehab team continue to assess barriers to discharge/monitor patient progress toward functional and medical goals  Care Tool:  Bathing    Body parts bathed by patient: Right arm, Left arm, Chest, Abdomen, Face, Right upper leg, Right lower leg   Body parts bathed by helper: Left upper leg, Left lower leg, Buttocks     Bathing assist Assist Level: Maximal Assistance - Patient 24 - 49%     Upper Body Dressing/Undressing Upper body dressing   What is the patient wearing?: Hospital gown only    Upper body assist Assist Level: Minimal Assistance - Patient > 75%    Lower Body Dressing/Undressing Lower body dressing      What is the patient wearing?: Underwear/pull up     Lower body assist Assist for lower body dressing: Moderate Assistance - Patient 50 - 74%     Toileting Toileting    Toileting assist Assist for toileting: Maximal Assistance - Patient 25 - 49%     Transfers Chair/bed transfer  Transfers assist     Chair/bed transfer assist level: Minimal Assistance - Patient > 75%     Locomotion Ambulation   Ambulation assist   Ambulation activity did not occur: Safety/medical concerns (fatigue, pain)  Assist level: Moderate Assistance - Patient 50 - 74% Assistive device: No Device Max distance: 50 feet   Walk 10 feet activity   Assist  Walk 10 feet activity did not occur: Safety/medical concerns (fatigue, pain)  Assist level: Moderate Assistance - Patient - 50 - 74% Assistive device: No Device   Walk 50 feet activity   Assist Walk 50 feet with 2 turns activity did not occur: Safety/medical concerns  Assist level: Moderate Assistance - Patient - 50 - 74% Assistive device: No Device    Walk 150 feet  activity   Assist Walk 150 feet activity did not occur: Safety/medical concerns         Walk 10 feet on uneven surface  activity   Assist Walk 10 feet on uneven surfaces activity did not occur: Safety/medical concerns (fatigue, pain)         Wheelchair     Assist Is the patient using a wheelchair?: Yes Type of Wheelchair: Manual Wheelchair activity did not occur: Safety/medical concerns (pain, fatigue)  Wheelchair assist level: Minimal Assistance - Patient > 75% Max wheelchair distance: 50    Wheelchair 50 feet with 2 turns activity    Assist    Wheelchair 50 feet with 2 turns activity did not occur: Safety/medical concerns (pain, fatigue)   Assist Level: Minimal Assistance - Patient > 75%   Wheelchair 150 feet activity     Assist  Wheelchair 150 feet activity did not occur:  Safety/medical concerns (pain, fatigue)   Assist Level: Maximal Assistance - Patient 25 - 49%   Blood pressure (!) 119/54, pulse (!) 49, temperature 98 F (36.7 C), resp. rate 18, height 5\' 8"  (1.727 m), weight 88.6 kg, SpO2 97%.  Medical Problem List and Plan: 1. Functional deficits secondary to CHF             -patient may not  shower, HD cath             -ELOS/Goals: 18-21d, pt  goal is home by Birthday on 10/29  -Continue CIR therapies including PT, OT   -Est Discharge 01/18/23  -Team conference tomorrow    2.  Antithrombotics: -DVT/anticoagulation:  Pharmaceutical: Eliquis             -antiplatelet therapy:    3. Pain Management: Tylenol as needed  -Discussed with nursing air mattress-was told he will not qualify  -K-pad ordered   4. Mood/Behavior/Sleep: LCSW to evaluate and provide emotional support             -antipsychotic agents: n/a   5. Neuropsych/cognition: This patient is  capable of making decisions on his  own behalf.   6. Skin/Wound Care: Routine skin care checks, Eucerin cream for stasis dermatitis   7. Fluids/Electrolytes/Nutrition: Strict Is and  Os and follow-up chemistries per nephrology             -renal diet with FR             -continue vitamin C, D3, Renavit   8: Hypertension: monitor TID and prn             -continue doxazosin 1 mg BID-continue to hold             -continue Lasix 80 mg daily             -continue hydralazine 25 mg TID  -BP has been somewhat labile, continue to monitor trend.  Monitor for symptoms of orthostatic hypotension, particularly after hemodialysis.  -BP stable overall, order thigh high compression stockings to avoid orthostatic hypotension  -10/17 patient declined use of compression stockings yesterday, BP stable overall  -10/19 decrease hydralazine to 10 mg 3 times daily  -10/21 BP remains quite soft--hold hydralazine  -10/22 will discuss Lasix with nephrology, BP continues to be all soft      01/13/2023    6:43 AM 01/12/2023    8:42 PM 01/12/2023    4:55 PM  Vitals with BMI  Systolic 119 98 96  Diastolic 54 51 51  Pulse 49 69 64     9: Hyperlipidemia: continue statin, Lovaza   10: Right eye infection probable blepharitis: continue Maxitrol 2 wk treatment    11: GERD: continue PPI   12: 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 hypoglycemia  -10/19 patient and wife were very concerned about hypoglycemia this morning, will dose increase Semglee to 14 units, consider increase mealtime insulin  -10/22 decrease Semglee to 12 units    CBG (last 3)  Recent Labs    01/12/23 2102 01/13/23 0639 01/13/23 1204  GLUCAP  79 75 132*       13: ESRD: HD on T/T/S via right internal jugular TDC  -Reviewed nephrology note-hemodialysis today completed   14: Chronic diastolic CHF: daily weight (dry weight ~220#)  EF is normal  -(meds as in #8) -fluid balance>>HD -10/21 weights  fluctuate with HD  Filed Weights   01/10/23 0500 01/11/23 0500  Weight: 84.8 kg 88.6 kg      15: Atrial fibrillation: on Eliquis - bradycardia , not taking BBs monitor rate  -Avoid negative chronotropic medications  Heart rate stable continue to monitor   16: COPD/acute hypoxic respiratory failure due to volume overload:  -continue Breo Ellipta             -Wean supplemental O2 as able   17: CAD: on statin, meds as in #8   18: Obesity: BMI = 30.17  -Dietary education  -BMI a little improved to 29.7  Body mass index is 29.7 kg/m.    19: Anemia of chronic inflammation, cardiorenal disease ESRD: -continue Epogen 4000 units every TTS on hemodialysis days as per nephrology     20: Bilateral lower extremity lymphedema, chronic: -continue Lotrisone cream twice daily   21.  Fall.  -X-ray knees and hips without fracture or dislocation, arthritic changes noted  LOS: 9 days A FACE TO FACE EVALUATION WAS PERFORMED  Fanny Dance 01/13/2023, 12:22 PM

## 2023-01-13 NOTE — Progress Notes (Signed)
Occupational Therapy Session Note  Patient Details  Name: Kyle Roberson MRN: [id number] Date of Birth: 23-Oct-1940  Today's Date: 01/13/2023 OT Individual Time: 7829-5621 OT Individual Time Calculation (min): 38 min   Today's Date: 01/13/2023 OT Individual Time: 3086-5784 OT Individual Time Calculation (min): 58 min   Today's Date: 01/13/2023 OT Individual Time: 6962-9528 OT Individual Time Calculation (min): 47 min   Short Term Goals: Week 2:  OT Short Term Goal 1 (Week 2): STGs=LTGs due to patient's length of stay.  Skilled Therapeutic Interventions/Progress Updates:   Session 1: Pt received resting in bed for skilled OT session with focus on BADL participation. Pt agreeable to interventions, demonstrating overall pleasant mood. Pt reported "40"/10 R shoulder pain, pre-medicated. OT offering intermediate rest breaks and positioning suggestions throughout session to address pain/fatigue and maximize participation/safety in session.  Pt performs supine>sit EOB with HOB elevated and use of bed features. At EOB, pt threads BLE into sweat-pants with supervision + no use of AE, standing with supervision + RW to hike over bottom/hips. Pt performs initial STS with light Min A + RW, ambulating transfer from EOB>recliner with close supervision + RW. Pt receptive to elevating BLE for edema management.   Time dedicated to contacting spouse to coordinate further caregiver education.   Pt remained resting in recliner with all immediate needs met at end of session. Pt continues to be appropriate for skilled OT intervention to promote further functional independence.   Session 2: Pt received on toilet, NT present, skilled OT session with focus on toileting and BLE strengthening. Pt agreeable to interventions, demonstrating overall pleasant mood. Pt with un-rated pain. OT offering intermediate rest breaks and positioning suggestions throughout session to address pain/fatigue and maximize  participation/safety in session.   Pt completes 3/3 toileting activities with close supervision + RW, pt stands at sink-side to wash hands with same level of assistance. Pt dependent for WC transport from room<>day room for energy conservation. In day room, pt participates in the following strengthening exercises for decreased fall risk and independence during functional transfers.  Leg kicks Calf raises  Seated high knees  Pt completes 3x10 reps with 2# ankle weights, multimodal cuing provided for form.   Pt remained sitting in recliner with all immediate needs met at end of session. Pt continues to be appropriate for skilled OT intervention to promote further functional independence.   Session 3: Pt received sitting in recliner for skilled OT session with focus on functional transfers. Pt agreeable to interventions, demonstrating overall pleasant mood. Pt with no reports of pain this session. OT offering intermediate rest breaks and positioning suggestions throughout session to address pain/fatigue and maximize participation/safety in session.   Pt dependently transported to outside parking area for scheduled family education with spouse, [name]. Session focused on problem-solving car transfer into [employer]. Pt completes x1 variation of car transfer with no step, requiring A to bring LLE into footboard, this technique unfortunately caused pain in L-knee, and patient sliding uncontrolled upon exiting car, requiring Min-Mod A x2 to correct and prevent fall. 3-in step retrieved and trialed with great success using backward-step technique. Pt does require mod-max cuing for safety, technique, and integration of RW for increased support. Pt steps onto step with LLE (no buckling), requiring increased time to manage LLE into car, overall close supervision-Min A + RW.   Pt remained sitting in recliner with all immediate needs met at end of session. Pt continues to be appropriate for skilled OT  intervention to promote further functional  independence.   Therapy Documentation Precautions:  Precautions Precautions: Fall Restrictions Weight Bearing Restrictions: No RLE Weight Bearing: Weight bearing as tolerated LLE Weight Bearing: Weight bearing as tolerated   Therapy/Group: Individual Therapy  Lou Cal, OTR/L, MSOT  01/13/2023, 5:31 AM

## 2023-01-13 NOTE — Progress Notes (Signed)
Patient ID: Kyle Roberson, male   DOB: 1940/09/23, 82 y.o.   MRN: [id number] Met with pt to give team conference update regarding progress and plan for discharge 10/28. His wife will come in today and to a real car transfer to make sure can get into the explorer they have. Will touch base with wife when here today at 3:00 pm

## 2023-01-13 NOTE — Progress Notes (Signed)
Recreational Therapy Discharge Summary Patient Details  Name: Kyle Roberson MRN: [id number] Date of Birth: 14-Jan-1941 Today's Date: 01/13/2023  Comments on progress toward goals: Pt is scheduled for discharge home 10/28 and therefore is being discharged from TR services due to limited clinician availability prior to that date.  Pt education provided on importance of leisure & its impact on wellness.  During evaluation, pt expressed multiple stressors during his hospitalization so emotional support/coping strategies provided. Follow-up: Home Health  Patient/family agrees with progress made and goals achieved: Yes  Monroe Qin 01/13/2023, 3:15 PM

## 2023-01-14 LAB — CBC
HCT: 35.6 % — ABNORMAL LOW (ref 39.0–52.0)
Hemoglobin: 11.5 g/dL — ABNORMAL LOW (ref 13.0–17.0)
MCH: 29.2 pg (ref 26.0–34.0)
MCHC: 32.3 g/dL (ref 30.0–36.0)
MCV: 90.4 fL (ref 80.0–100.0)
Platelets: 218 10*3/uL (ref 150–400)
RBC: 3.94 MIL/uL — ABNORMAL LOW (ref 4.22–5.81)
RDW: 14.4 % (ref 11.5–15.5)
WBC: 9 10*3/uL (ref 4.0–10.5)
nRBC: 0 % (ref 0.0–0.2)

## 2023-01-14 LAB — RENAL FUNCTION PANEL
Albumin: 2.9 g/dL — ABNORMAL LOW (ref 3.5–5.0)
Anion gap: 18 — ABNORMAL HIGH (ref 5–15)
BUN: 75 mg/dL — ABNORMAL HIGH (ref 8–23)
CO2: 22 mmol/L (ref 22–32)
Calcium: 8.8 mg/dL — ABNORMAL LOW (ref 8.9–10.3)
Chloride: 94 mmol/L — ABNORMAL LOW (ref 98–111)
Creatinine, Ser: 7.58 mg/dL — ABNORMAL HIGH (ref 0.61–1.24)
GFR, Estimated: 7 mL/min — ABNORMAL LOW (ref >60)
Glucose, Bld: 162 mg/dL — ABNORMAL HIGH (ref 70–99)
Phosphorus: 5.1 mg/dL — ABNORMAL HIGH (ref 2.5–4.6)
Potassium: 4 mmol/L (ref 3.5–5.1)
Sodium: 134 mmol/L — ABNORMAL LOW (ref 135–145)

## 2023-01-14 LAB — GLUCOSE, CAPILLARY
Glucose-Capillary: 46 mg/dL — ABNORMAL LOW (ref 70–99)
Glucose-Capillary: 90 mg/dL (ref 70–99)
Glucose-Capillary: 286 mg/dL — ABNORMAL HIGH (ref 70–99)
Glucose-Capillary: 114 mg/dL — ABNORMAL HIGH (ref 70–99)

## 2023-01-14 MED ORDER — ISOSORBIDE MONONITRATE ER 30 MG PO TB24
30.0000 mg | ORAL_TABLET | Freq: Every day | ORAL | Status: DC
  Administered 2023-01-15 – 2023-01-18 (×4): 30 mg via ORAL
  Filled 2023-01-14 (×4): qty 1

## 2023-01-14 MED ORDER — ALTEPLASE 2 MG IJ SOLR: 2.0000 mg | Freq: Once | INTRAMUSCULAR | Status: DC | PRN

## 2023-01-14 MED ORDER — HEPARIN SODIUM (PORCINE) 1000 UNIT/ML IJ SOLN
3200.0000 [IU] | Freq: Once | INTRAMUSCULAR | Status: AC
  Administered 2023-01-14: 3200 [IU] via INTRAVENOUS
  Filled 2023-01-14: qty 4

## 2023-01-14 MED ORDER — INSULIN GLARGINE-YFGN 100 UNIT/ML ~~LOC~~ SOLN
6.0000 [IU] | Freq: Every day | SUBCUTANEOUS | Status: DC
  Filled 2023-01-14 (×2): qty 0.06

## 2023-01-14 MED ORDER — PANTOPRAZOLE SODIUM 40 MG PO TBEC
40.0000 mg | DELAYED_RELEASE_TABLET | Freq: Two times a day (BID) | ORAL | Status: DC
  Administered 2023-01-14 – 2023-01-18 (×8): 40 mg via ORAL
  Filled 2023-01-14 (×8): qty 1

## 2023-01-14 MED ORDER — HEPARIN SODIUM (PORCINE) 1000 UNIT/ML IJ SOLN
4000.0000 [IU] | Freq: Once | INTRAMUSCULAR | Status: AC
  Administered 2023-01-14: 4000 [IU] via INTRAVENOUS
  Filled 2023-01-14: qty 4

## 2023-01-14 MED ORDER — ANTICOAGULANT SODIUM CITRATE 4% (200MG/5ML) IV SOLN: 5.0000 mL | Status: DC | PRN

## 2023-01-14 MED ORDER — LIDOCAINE HCL (PF) 1 % IJ SOLN: 5.0000 mL | INTRAMUSCULAR | Status: DC | PRN

## 2023-01-14 MED ORDER — PENTAFLUOROPROP-TETRAFLUOROETH EX AERO: 1.0000 | INHALATION_SPRAY | CUTANEOUS | Status: DC | PRN

## 2023-01-14 MED ORDER — INSULIN GLARGINE-YFGN 100 UNIT/ML ~~LOC~~ SOLN: 7.0000 [IU] | Freq: Every day | SUBCUTANEOUS | Status: DC

## 2023-01-14 MED ORDER — LIDOCAINE-PRILOCAINE 2.5-2.5 % EX CREA: 1.0000 | TOPICAL_CREAM | CUTANEOUS | Status: DC | PRN

## 2023-01-14 MED ORDER — HEPARIN SODIUM (PORCINE) 1000 UNIT/ML DIALYSIS: 1000.0000 [IU] | INTRAMUSCULAR | Status: DC | PRN

## 2023-01-14 NOTE — Progress Notes (Signed)
Received patient in bed to unit.  Alert and oriented.  Informed consent signed and in chart.   TX duration:3.5 hours.  Patient had low BP and had to lower goal per Dr. Thedore Mins.  Patient tolerated well.  Transported back to the room  Alert, without acute distress.  Hand-off given to patient's nurse.   Access used: Right internal jugular HD cath Access issues: Cartridge clotted off in the beginning of session.  Total UF removed: Medication(s) given: none   01/14/23 1856  Vitals  Temp 98 F (36.7 C)  BP (!) 93/56  MAP (mmHg) 67  Pulse Rate 60  ECG Heart Rate 64  Resp (!) 21  MEWS COLOR  MEWS Score Color Yellow  Oxygen Therapy  SpO2 98 %  O2 Device Room Air  MEWS Score  MEWS Temp 0  MEWS Systolic 1  MEWS Pulse 0  MEWS RR 1  MEWS LOC 0  MEWS Score 2     Stacie Glaze LPN [facility]

## 2023-01-14 NOTE — Progress Notes (Signed)
PROGRESS NOTE   Subjective/Complaints: Patient seen after he is completing therapy this morning.  He was noted as having a hypoglycemic event this morning.  He reports his back pain is still doing better with air mattress.  ROS: Patient denies fever, chills, rash, sore throat, blurred vision, dizziness, nausea, vomiting, diarrhea, cough, shortness of breath or chest pain,  headache, or mood change.   + Back pain-improved    Objective:   No results found. Recent Labs    01/14/23 1452  WBC 9.0  HGB 11.5*  HCT 35.6*  PLT 218     Recent Labs    01/14/23 1452  NA 134*  K 4.0  CL 94*  CO2 22  GLUCOSE 162*  BUN 75*  CREATININE 7.58*  CALCIUM 8.8*      Intake/Output Summary (Last 24 hours) at 01/14/2023 1637 Last data filed at 01/14/2023 3875 Gross per 24 hour  Intake 120 ml  Output --  Net 120 ml        Physical Exam: Vital Signs Blood pressure 106/62, pulse 66, temperature 98.4 F (36.9 C), resp. rate (!) 27, height 5\' 8"  (1.727 m), weight 87.8 kg, SpO2 97%. Body mass index is 29.43 kg/m.  Constitutional: No distress . Vital signs reviewed. HEENT: NCAT, conjugate gaze, MMM Neck: supple Cardiovascular: RRR, no MRG Respiratory/Chest: CTAB, normal effort, CTAB GI/Abdomen: Nondistended, soft, positive bowel sounds Ext: no clubbing, cyanosis, or edema Psych: Pleasant skin: No evidence of breakdown, severe stasis dermatitis changes  TDC R chest wall  Neurologic: Awake and alert working with therapy.  Moving all 4 extremities to gravity  Musculoskeletal: No joint swelling  Assessment/Plan: 1. Functional deficits which require 3+ hours per day of interdisciplinary therapy in a comprehensive inpatient rehab setting. Physiatrist is providing close team supervision and 24 hour management of active medical problems listed below. Physiatrist and rehab team continue to assess barriers to discharge/monitor  patient progress toward functional and medical goals  Care Tool:  Bathing    Body parts bathed by patient: Right arm, Left arm, Chest, Abdomen, Face, Right upper leg, Right lower leg   Body parts bathed by helper: Left upper leg, Left lower leg, Buttocks     Bathing assist Assist Level: Maximal Assistance - Patient 24 - 49%     Upper Body Dressing/Undressing Upper body dressing   What is the patient wearing?: Hospital gown only    Upper body assist Assist Level: Minimal Assistance - Patient > 75%    Lower Body Dressing/Undressing Lower body dressing      What is the patient wearing?: Underwear/pull up     Lower body assist Assist for lower body dressing: Moderate Assistance - Patient 50 - 74%     Toileting Toileting    Toileting assist Assist for toileting: Maximal Assistance - Patient 25 - 49%     Transfers Chair/bed transfer  Transfers assist     Chair/bed transfer assist level: Minimal Assistance - Patient > 75%     Locomotion Ambulation   Ambulation assist   Ambulation activity did not occur: Safety/medical concerns (fatigue, pain)  Assist level: Moderate Assistance - Patient 50 - 74% Assistive device: No Device Max distance:  50 feet   Walk 10 feet activity   Assist  Walk 10 feet activity did not occur: Safety/medical concerns (fatigue, pain)  Assist level: Moderate Assistance - Patient - 50 - 74% Assistive device: No Device   Walk 50 feet activity   Assist Walk 50 feet with 2 turns activity did not occur: Safety/medical concerns  Assist level: Moderate Assistance - Patient - 50 - 74% Assistive device: No Device    Walk 150 feet activity   Assist Walk 150 feet activity did not occur: Safety/medical concerns         Walk 10 feet on uneven surface  activity   Assist Walk 10 feet on uneven surfaces activity did not occur: Safety/medical concerns (fatigue, pain)         Wheelchair     Assist Is the patient using a  wheelchair?: Yes Type of Wheelchair: Manual Wheelchair activity did not occur: Safety/medical concerns (pain, fatigue)  Wheelchair assist level: Minimal Assistance - Patient > 75% Max wheelchair distance: 50    Wheelchair 50 feet with 2 turns activity    Assist    Wheelchair 50 feet with 2 turns activity did not occur: Safety/medical concerns (pain, fatigue)   Assist Level: Minimal Assistance - Patient > 75%   Wheelchair 150 feet activity     Assist  Wheelchair 150 feet activity did not occur: Safety/medical concerns (pain, fatigue)   Assist Level: Maximal Assistance - Patient 25 - 49%   Blood pressure 106/62, pulse 66, temperature 98.4 F (36.9 C), resp. rate (!) 27, height 5\' 8"  (1.727 m), weight 87.8 kg, SpO2 97%.  Medical Problem List and Plan: 1. Functional deficits secondary to CHF             -patient may not  shower, HD cath             -ELOS/Goals: 18-21d, pt  goal is home by Birthday on 10/29  -Continue CIR therapies including PT, OT   -Est Discharge 01/18/23  -Expected discharge 01/18/2023     2.  Antithrombotics: -DVT/anticoagulation:  Pharmaceutical: Eliquis             -antiplatelet therapy:    3. Pain Management: Tylenol as needed  -Discussed with nursing air mattress-was told he will not qualify  -K-pad ordered   4. Mood/Behavior/Sleep: LCSW to evaluate and provide emotional support             -antipsychotic agents: n/a   5. Neuropsych/cognition: This patient is  capable of making decisions on his  own behalf.   6. Skin/Wound Care: Routine skin care checks, Eucerin cream for stasis dermatitis   7. Fluids/Electrolytes/Nutrition: Strict Is and Os and follow-up chemistries per nephrology             -renal diet with FR             -continue vitamin C, D3, Renavit   8: Hypertension: monitor TID and prn             -continue doxazosin 1 mg BID-continue to hold             -continue Lasix 80 mg daily             -continue hydralazine 25 mg  TID  -BP has been somewhat labile, continue to monitor trend.  Monitor for symptoms of orthostatic hypotension, particularly after hemodialysis.  -BP stable overall, order thigh high compression stockings to avoid orthostatic hypotension  -10/17 patient declined use of compression stockings  yesterday, BP stable overall  -10/19 decrease hydralazine to 10 mg 3 times daily  -10/21 BP remains quite soft--hold hydralazine  -10/22 will discuss Lasix with nephrology, BP continues to be all soft   -10/23 Lasix continued after discussion with nephrology, monitor fluid status  10/24 nephrology decreased Imdur to 30 mg daily    01/14/2023    4:30 PM 01/14/2023    4:00 PM 01/14/2023    3:49 PM  Vitals with BMI  Systolic 106 128 161  Diastolic 62 53 48  Pulse 66 62 52     9: Hyperlipidemia: continue statin, Lovaza   10: Right eye infection probable blepharitis: continue Maxitrol 2 wk treatment    11: GERD: continue PPI   12: 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 hypoglycemia  -10/19 patient and wife were very concerned about hypoglycemia this morning, will dose increase Semglee to 14 units, consider increase mealtime insulin  -10/22 decrease Semglee to 12 units  -10/23 decrease Semglee to 9 units today, decrease NovoLog to 2 units with meals to reduce risk of hypoglycemia  -10/24 will decrease Semglee to 6 units, had a hypoglycemic event this a.m. he did get lower dose of insulin today    CBG (last 3)  Recent Labs    01/14/23 0623 01/14/23 0651 01/14/23 1145  GLUCAP 46* 90 286*       13: ESRD: HD on T/T/S via right internal jugular TDC  -Reviewed nephrology note-next HD today   14: Chronic diastolic CHF: daily weight (dry weight ~220#)  EF is normal  -(meds as  in #8) -fluid balance>>HD -10/21 weights fluctuate with HD  Filed Weights   01/11/23 0500 01/14/23 1420  Weight: 88.6 kg 87.8 kg      15: Atrial fibrillation: on Eliquis - bradycardia , not taking BBs monitor rate  -Avoid negative chronotropic medications  Heart rate stable continue to monitor   16: COPD/acute hypoxic respiratory failure due to volume overload:  -continue Breo Ellipta             -Wean supplemental O2 as able   17: CAD: on statin, meds as in #8   18: Obesity: BMI = 30.17  -Dietary education  -BMI a little improved to 29.7  Body mass index is 29.43 kg/m.    19: Anemia of chronic inflammation, cardiorenal disease ESRD: -continue Epogen 4000 units every TTS on hemodialysis days as per nephrology     20: Bilateral lower extremity lymphedema, chronic: -continue Lotrisone cream twice daily   21.  Fall.  -X-ray knees and hips without fracture or dislocation, arthritic changes noted.  Pain is improving in these areas  LOS: 10 days A FACE TO FACE EVALUATION WAS PERFORMED  Fanny Dance 01/14/2023, 4:37 PM

## 2023-01-14 NOTE — Progress Notes (Signed)
Kyle Roberson Progress Note   Subjective:   Patient seen and examined at bedside. No acute events, currently doing OT. No complaints.  Objective Vitals:   01/12/23 2042 01/13/23 0643 01/13/23 2008 01/14/23 0331  BP: (!) 98/51 (!) 119/54 (!) 108/56 123/61  Pulse: 69 (!) 49 (!) 56 (!) 54  Resp: 17 18 18 18   Temp: 98.8 F (37.1 C) 98 F (36.7 C) 97.6 F (36.4 C) 97.6 F (36.4 C)  TempSrc:      SpO2: 100% 97% 96% 97%  Weight:      Height:       Physical Exam General:chronically ill appearing male in NAD, sitting in wheelchair Heart:RRR, no mrg Lungs:CTAB anteriorly Abdomen:soft, ND Extremities:chronic skin changes, no LE edema Neuro: awake, alert Dialysis Access: [facility]   Filed Weights   01/10/23 0500 01/11/23 0500  Weight: 84.8 kg 88.6 kg    Intake/Output Summary (Last 24 hours) at 01/14/2023 0902 Last data filed at 01/14/2023 1610 Gross per 24 hour  Intake 120 ml  Output --  Net 120 ml    Additional Objective Labs: Basic Metabolic Panel: Recent Labs  Lab 01/07/23 1452 01/09/23 1345  NA 134* 136  K 4.0 3.7  CL 96* 95*  CO2 21* 22  GLUCOSE 171* 116*  BUN 81* 79*  CREATININE 7.43* 6.47*  CALCIUM 8.1* 8.5*  PHOS 7.6* 5.7*   Liver Function Tests: Recent Labs  Lab 01/07/23 1452 01/09/23 1345  ALBUMIN 3.0* 2.9*   CBC: Recent Labs  Lab 01/07/23 1452 01/09/23 1345  WBC 10.1 10.3  HGB 11.7* 10.7*  HCT 36.7* 33.6*  MCV 93.4 91.3  PLT 211 192   CBG: Recent Labs  Lab 01/13/23 1204 01/13/23 1626 01/13/23 2128 01/14/23 0623 01/14/23 0651  GLUCAP 132* 104* 149* 46* 90   Iron Studies:  No results for input(s): "IRON", "TIBC", "TRANSFERRIN", "FERRITIN" in the last 72 hours.  Lab Results  Component Value Date   INR 1.5 (H) 12/20/2022   INR 1.2 12/07/2022   INR 1.6 (H) 12/03/2022   Studies/Results: No results found.  Medications:   (feeding supplement) PROSource Plus  30 mL Oral BID BM   apixaban  2.5 mg Oral BID   vitamin  C  500 mg Oral BID   Chlorhexidine Gluconate Cloth  6 each Topical Q12H   cholecalciferol  2,000 Units Oral Daily   clotrimazole-betamethasone   Topical BID   docusate sodium  100 mg Oral BID   fluticasone furoate-vilanterol  1 puff Inhalation Daily   hydrocerin   Topical TID   insulin aspart  0-15 Units Subcutaneous TID AC & HS   insulin aspart  2 Units Subcutaneous TID WC   insulin glargine-yfgn  9 Units Subcutaneous Daily   isosorbide mononitrate  60 mg Oral Daily   melatonin  5 mg Oral QHS   multivitamin  1 tablet Oral QHS   neomycin-polymyxin b-dexamethasone  1 Application Right Eye TID   omega-3 acid ethyl esters  1 g Oral Daily   pantoprazole  40 mg Oral Daily   rosuvastatin  10 mg Oral Once per day on Monday Wednesday Friday   sevelamer carbonate  1,600 mg Oral TID WC    Dialysis Orders: Kyle Roberson is a 82 y.o. 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    Dialysis Orders: Has been accepted to [facility] [place] on TTS  per notes (will start after CIR d/c) Establishing orders.    Assessment/Plan: Debility: Admitted to CIR for ongoing rehab. ESRD:  Continue HD per TTS schedule - Next HD 10/24.  Using [facility]. Have not pursued permanent access at this time, may be hindrance to his therapy sessions to do inpatient, can revisit as an outpatient. His eventual goal is to be able to do renal replacement therapy at home Hypertension/volume: BP variable, abdominal distention improving, continue UF as tolerated.  Establishing dry weight.  Report standing for weights in HD. Given low BP's Decreasing imdur to 30mg  daily 10/24 Anemia of ESRD: no ESA needs at this time. Tsat 19%.  If Hgb starts to drop will order IV iron course. Hgb 10.7 Metabolic bone disease: Renvela 2tabs AC.  Pth 344, in goal for stage of CKD. Last Po4 improving  Nutrition: Alb low, continue  renal diet + prot supps. T2DM - management per primary service HFpEF: UF as tolerated, off-HD diuretics d/c'ed given low BPs   Anthony Sar, MD [facility]

## 2023-01-14 NOTE — Progress Notes (Signed)
Physical Therapy Session Note  Patient Details  Name: Kyle Roberson MRN: [id number] Date of Birth: 01-14-1941  Today's Date: 01/14/2023 PT Individual Time: 0950-1100 PT Individual Time Calculation (min): 70 min   Short Term Goals: Week 2:  PT Short Term Goal 1 (Week 2): STG=LTG 2/2 ELOS  Skilled Therapeutic Interventions/Progress Updates:    Pt presents in room in [facility], agreeable to PT. Pt denies pain at this time. Session focused on gait training for tolerance to upright, therapeutic exercise to complete as HEP at DC, and education on POC and CLOF with pt wife for DC planning.  Pt reports wife is on her way at start of session, pt therefore provided with HEP with below exercises and educated thoroughly on safety with each one, requires increased time for education due to pt hyperverbose and easily distracted during session.  Pt transported to day room and completes sit<>stand with supervision, ambulates 175' total with RW, 150' of which pt requires only supervision, last 44' pt requires CGA due to pt reporting being unsteady and pt demonstrating decreased proximity to RW with anterior trunk lean, cues to correct.  Pt comes to sitting in [facility] for rest break, at this time pt wife arrives for family education session. Pt wife provided with education on pt current functional status and safety at home with pt wife verbalizing understanding and stating pleased with pt progress, denies concerns for home. Pt wife educated on follow up PT services via outpatient PT with communication completed with SW at end of session.  Pt completes one set of the following exercises to complete as HEP, pt wife provided with education on how to assist pt and cues to provide for desired muscular activation including:  Access Code: [email or web address] URL: [email or web address] Date: 01/14/2023 Prepared by: Edwin Cap  Exercises - Standing March with RW  - 1 x daily - 7 x weekly - 3 sets - 10 reps - Heel  Raises with RW - 1 x daily - 7 x weekly - 3 sets - 10 reps - Standing Hamstring Curl with RW  - 1 x daily - 7 x weekly - 3 sets - 10 reps - Sit to Stand with RW  - 1 x daily - 7 x weekly - 3 sets - 10 reps - Standing Hip Abduction with RW  - 1 x daily - 7 x weekly - 3 sets - 10 reps  Pt ambulates 68' with RW close supervision CGA towards room before requesting to sit in [facility]. At this point pt returned to room, pt becomes verbally agitated with presence of Child psychotherapist. Pt remains seated in WC in room with wife present at end of session.  Therapy Documentation Precautions:  Precautions Precautions: Fall Restrictions Weight Bearing Restrictions: No RLE Weight Bearing: Weight bearing as tolerated LLE Weight Bearing: Weight bearing as tolerated    Therapy/Group: Individual Therapy  Edwin Cap PT, DPT 01/14/2023, 11:54 AM

## 2023-01-14 NOTE — Progress Notes (Signed)
Patient ID: Kyle Roberson, male   DOB: 1940-08-08, 82 y.o.   MRN: [id number]  Met with pt and wife who is here for family education. Discussed OP versus home health, wife wanted OP. [facility] has an OP PT in [place] will fax order and ask them to call wife to set up follow up appointments.

## 2023-01-14 NOTE — Progress Notes (Addendum)
Contacted [facility] admissions and [facility] directly this morning in an attempt to see if pt's schedule can be changed to a 2nd shift appt at d/c. Navigator being advised by [facility] admissions that clinic does not have a TTS 2nd shift but may have a MWF 2nd shift available. Awaiting final determination from [facility]. Update provided to nephrologist and CSW. Will assist as needed.   Olivia Canter Renal Navigator [phone]  Addendum at 12:30 pm: Pt has been approved for [facility] [place] on MWF 11:45 am chair time. Pt can start on Wed, 10/30 and will need to arrive at 11:30 am. Spoke to pt's wife via phone to provide arrangements. Wife reports she is working with pt's nephrologist office trying to get a TTS 2nd shift at [facility] and is awaiting a return call from MD office. Wife does not want to formally accept MWF 2nd shift until she hears back from MD office. Wife to return call to navigator with update after hearing from MD office. Update provided to nephrologist and CSW.

## 2023-01-14 NOTE — Progress Notes (Signed)
Occupational Therapy Session Note  Patient Details  Name: Kyle Roberson MRN: [id number] Date of Birth: July 20, 1940  Today's Date: 01/14/2023 OT Individual Time: 1st Session [id number], 2nd session 1104-1202 OT Individual Time Calculation (min): 58 min, 58 min    Short Term Goals: Week 2:  OT Short Term Goal 1 (Week 2): STGs=LTGs due to patient's length of stay.  Skilled Therapeutic Interventions/Progress Updates:   Session 1:  Pt bed level upon OT arrival. Supine to sit with increased time and effort and CGA. Sit to stand with close S and SPT to w/c. Simple sponge bathing sink side then amb to toilet with RW with CGA. No BM or voiding completed. Pullover shirt with set up and pull on elastic pants with reacher with min A incl standing at RW with improved balance noted. Velcro tennis shoes applied with min A with LHSH. Left pt bed side with md doing rounds and needs and chair alarm set.   Pain: 6/10 with reach, 0/10 at rest R shoulder, plan for applying ktape during Family Educ with wife    Session 2:  Pt w/c level with wife Kyle Roberson bedside for [employer]. OT began session with ktape application to R shoulder and wife able to teach back info and took photo for reference for home application as needed. Wife was able to teach back all education for applying gait belt, use of reacher and LH shoe horn (she will purchase both) and LH sponge issued this session for LB self care use. Wife amb pt from w/c bedside in and out of bathroom for toilet use with grab bar only and walk in shower transfer with grab bar use for stepping over simulated threshold with UE support. OT reinforced B UE simple therex , positioning and skin protection. Pt and wife report they will only do OPPT. Left pt and wife with NT for blood sugars with chair alarm and all needs in reach.   Pain: reported 2/10 R sh pain with ktape to R RTC applied, rationale given and wife instructed in use with + teach back   Therapy  Documentation Precautions:  Precautions Precautions: Fall Restrictions Weight Bearing Restrictions: Yes RLE Weight Bearing: Weight bearing as tolerated LLE Weight Bearing: Weight bearing as tolerated  Therapy/Group: Individual Therapy  Vicenta Dunning 01/14/2023, 7:51 AM

## 2023-01-14 NOTE — Discharge Instructions (Addendum)
Inpatient Rehab Discharge Instructions  Kyle Roberson Discharge date and time: 01/18/2023   Activities/Precautions/ Functional Status: Activity: no lifting, driving, or strenuous exercise until cleared by MD Diet: renal diet Wound Care: routine catheter care Functional status:  ___ No restrictions     ___ Walk up steps independently _x__ 24/7 supervision/assistance   ___ Walk up steps with assistance ___ Intermittent supervision/assistance  ___ Bathe/dress independently ___ Walk with walker     ___ Bathe/dress with assistance ___ Walk Independently    ___ Shower independently ___ Walk with assistance    __x_ Shower with assistance _x__ No alcohol     ___ Return to work/school ________  Special Instructions: No driving, alcohol consumption or tobacco use.   My questions have been answered and I understand these instructions. I will adhere to these goals and the provided educational materials after my discharge from the hospital.  Patient/Caregiver Signature _______________________________ Date __________  Clinician Signature _______________________________________ Date __________  Please bring this form and your medication list with you to all your follow-up doctor's appointments.    COMMUNITY REFERRALS UPON DISCHARGE:     Outpatient: PT             Agency: [facility] OUTPATIENT [facility] [place] [address]  Phone: [phone]             Appointment Date/Time:WILL CALL WIFE TO SET UP FOLLOW UP APPOINTMENT  Medical Equipment/Items Ordered: HAS ALL NEEDED EQUIPMENT FROM OTHER ADMISSIONS                                                 Agency/Supplier:NA

## 2023-01-14 NOTE — Progress Notes (Signed)
Hypoglycemic Event  CBG: 46  Treatment: 8 oz juice/soda  Symptoms: None  Follow-up CBG: Time:90 CBG Result:0651  Possible Reasons for Event: Unknown  Comments/MD notified:Charge nurse made aware    Thereasa Parkin

## 2023-01-15 DIAGNOSIS — K219 Gastro-esophageal reflux disease without esophagitis: Secondary | ICD-10-CM

## 2023-01-15 DIAGNOSIS — R140 Abdominal distension (gaseous): Secondary | ICD-10-CM

## 2023-01-15 LAB — GLUCOSE, CAPILLARY
Glucose-Capillary: 92 mg/dL (ref 70–99)
Glucose-Capillary: 226 mg/dL — ABNORMAL HIGH (ref 70–99)
Glucose-Capillary: 158 mg/dL — ABNORMAL HIGH (ref 70–99)
Glucose-Capillary: 153 mg/dL — ABNORMAL HIGH (ref 70–99)

## 2023-01-15 MED ORDER — SIMETHICONE 80 MG PO CHEW: 80.0000 mg | CHEWABLE_TABLET | Freq: Four times a day (QID) | ORAL | Status: DC | PRN

## 2023-01-15 NOTE — Progress Notes (Signed)
[facility] Progress Note   Subjective:   Patient seen and examined at bedside. Tolerated HD yesterday, did have low BP therefore UF goal lowered to 0.5L which was removed. He denies any chest pain, SOB, swelling.  Objective Vitals:   01/14/23 2023 01/15/23 0001 01/15/23 0415 01/15/23 0500  BP: (!) 93/46 (!) 126/49 (!) 120/53   Pulse: 68  (!) 54   Resp: 18 18 18    Temp: 97.6 F (36.4 C) 97.6 F (36.4 C) 97.6 F (36.4 C)   TempSrc: Oral  Oral   SpO2: 100% 100% 98%   Weight:    82 kg  Height:       Physical Exam General:chronically ill appearing male in NAD, sitting in recliner Heart:RRR, no mrg Lungs:CTAB anteriorly Abdomen:soft, ND Extremities:chronic skin changes, no LE edema Neuro: awake, alert Dialysis Access: [facility]   Filed Weights   01/14/23 1420 01/14/23 1900 01/15/23 0500  Weight: 87.8 kg 87 kg 82 kg    Intake/Output Summary (Last 24 hours) at 01/15/2023 1232 Last data filed at 01/15/2023 0733 Gross per 24 hour  Intake 240 ml  Output 500 ml  Net -260 ml    Additional Objective Labs: Basic Metabolic Panel: Recent Labs  Lab 01/09/23 1345 01/14/23 1452  NA 136 134*  K 3.7 4.0  CL 95* 94*  CO2 22 22  GLUCOSE 116* 162*  BUN 79* 75*  CREATININE 6.47* 7.58*  CALCIUM 8.5* 8.8*  PHOS 5.7* 5.1*   Liver Function Tests: Recent Labs  Lab 01/09/23 1345 01/14/23 1452  ALBUMIN 2.9* 2.9*   CBC: Recent Labs  Lab 01/09/23 1345 01/14/23 1452  WBC 10.3 9.0  HGB 10.7* 11.5*  HCT 33.6* 35.6*  MCV 91.3 90.4  PLT 192 218   CBG: Recent Labs  Lab 01/14/23 0651 01/14/23 1145 01/14/23 2055 01/15/23 0625 01/15/23 1200  GLUCAP 90 286* 114* 92 226*   Iron Studies:  No results for input(s): "IRON", "TIBC", "TRANSFERRIN", "FERRITIN" in the last 72 hours.  Lab Results  Component Value Date   INR 1.5 (H) 12/20/2022   INR 1.2 12/07/2022   INR 1.6 (H) 12/03/2022   Studies/Results: No results found.  Medications:   (feeding  supplement) PROSource Plus  30 mL Oral BID BM   apixaban  2.5 mg Oral BID   vitamin C  500 mg Oral BID   Chlorhexidine Gluconate Cloth  6 each Topical Q12H   cholecalciferol  2,000 Units Oral Daily   clotrimazole-betamethasone   Topical BID   docusate sodium  100 mg Oral BID   fluticasone furoate-vilanterol  1 puff Inhalation Daily   hydrocerin   Topical TID   insulin aspart  0-15 Units Subcutaneous TID AC & HS   insulin aspart  2 Units Subcutaneous TID WC   insulin glargine-yfgn  6 Units Subcutaneous Daily   isosorbide mononitrate  30 mg Oral Daily   melatonin  5 mg Oral QHS   multivitamin  1 tablet Oral QHS   neomycin-polymyxin b-dexamethasone  1 Application Right Eye TID   omega-3 acid ethyl esters  1 g Oral Daily   pantoprazole  40 mg Oral BID   rosuvastatin  10 mg Oral Once per day on Monday Wednesday Friday   sevelamer carbonate  1,600 mg Oral TID WC     Kyle Roberson is a 82 y.o. male with (newly declared during [facility] admit) ESRD, T2DM, HTN, CAD, HFpEF, A-fib (on Eliquis), and Hx SDH (01/2021) who was transferred from [facility] to [facility] for  ongoing rehab.    Dialysis Orders: Establishing orders.    Assessment/Plan: Debility: Admitted to CIR for ongoing rehab. ESRD:  Continue HD per TTS schedule - Next HD 10/26.  Using [facility]. Have not pursued permanent access at this time, may be hindrance to his therapy sessions to do inpatient, can revisit as an outpatient. His eventual goal is to be able to do renal replacement therapy at home Hypertension/volume: BP variable, abdominal distention improving, continue UF as tolerated.  Establishing dry weight.  Report standing for weights in HD. Given low BP's Decreased imdur to 30mg  daily 10/24 Anemia of ESRD: no ESA needs at this time. Tsat 19%.  If Hgb starts to drop will order IV iron course. Hgb 11.5 Metabolic bone disease: Renvela 2tabs AC.  Pth 344, in goal for stage of CKD. Last Po4 improving  Nutrition: Alb low, continue renal diet + prot  supps. T2DM - management per primary service HFpEF: UF as tolerated, off-HD diuretics d/c'ed given low BPs   Dispo: EDD 10/28, accepted to [facility] MWF 11:45 chair time, start 10/30 however family wishes for TTS 2nd shift. Renal navigator and SW/CM following.  Anthony Sar, MD [facility]

## 2023-01-15 NOTE — Progress Notes (Signed)
Physical Therapy Session Note  Patient Details  Name: Kyle Roberson MRN: [id number] Date of Birth: Feb 23, 1941  Today's Date: 01/15/2023 PT Individual Time: 0918-1020 PT Individual Time Calculation (min): 62 min   Short Term Goals: Week 2:  PT Short Term Goal 1 (Week 2): STG=LTG 2/2 ELOS  Skilled Therapeutic Interventions/Progress Updates: Patient supine in bed on entrance to room. Patient alert and agreeable to PT session.   Patient reported no pain at beginning of session, but reported not receiving adequate sleep the night before. Today's session focused on standing endurance/tolerance during functional mobility.   Therapeutic Activity: Bed Mobility: Pt performed supine<>sit on EOB with CGA and VC to use R UE to assist in pulling self over.  Transfers: Pt performed sit<>stand transfers throughout session with RW and modA from EOB due to pt feeling fatigued and not much movement OOB this morning prior to therapy. Pt then progressed to CGA throughout session as pt started to move lower body and upper body.  - Pt sitting EOB donning personal shirt with modA due to pt not acknowledging that he did not place R UE through sleeve. Pt required change in brief due to tab falling off. Pt sitting EOB with PTA donning new brief around distal thighs, and pt stood to RW with minA. PTA donned totalA of brief and personal pants (also threaded around B LE's while pt in seated position) for time management.   Stair Training: - Ascending 4 (3") steps with use of B HR, and with CGA required. Pt with step to pattern. Pt required VC to ensure entire B feet are on steps.  - Descending 4 (3") steps with use of B HR, and with CGA required. Pt with step to pattern.  Pt performed 2 rounds with no rest between the 2. Pt reported 8-10/10 when asked RPE and provided with extended rest break  HR 55 bpm SpO2 98% Taken after seated rest break following stair navigation  Therapeutic Exercise: Pt performed the  following exercises with therapist providing the described cuing and facilitation for improvement. - Pt ambulated in main gym following stair navigation with CGA in RW. Pt with WC follow for safety with pt requiring rest break after about 50' due to fatigue. Pt transported back to room for time management in [facility].   Patient sitting in recliner at end of session with brakes locked, chair alarm set, and all needs within reach.      Therapy Documentation Precautions:  Precautions Precautions: Fall Restrictions Weight Bearing Restrictions: No RLE Weight Bearing: Weight bearing as tolerated LLE Weight Bearing: Weight bearing as tolerated   Therapy/Group: Individual Therapy  Rafeal Skibicki PTA 01/15/2023, 12:41 PM

## 2023-01-15 NOTE — Progress Notes (Addendum)
Spoke to pt's wife via phone. Wife states she has had no response from nephrologist office as of yet. Wife agreeable to pt being d/c from rehab on Monday am and then pt start at [facility] on Monday (arrive at 11:30 for 11:45 am chair time). Contacted [facility] FA to see if pt can start on Monday and advised pt can start on Monday after d/c from rehab. Contacted nephrologist and CSW to provide an update and to inquire if pt can be d/c from rehab Monday morning by 10:00 am. Advised that is possible. Pt will receive HD tomorrow per nephrologist and will plan for pt to start at clinic on Monday. Contacted [facility] admissions as well to request start date be changed to Monday, Sept 28. Wife aware of plan for pt to d/c by 10:00 am on Monday and then proceed to out-pt HD at [facility] at 11:30 am. Wife agreeable to plan. FA at [facility] aware of plan for pt to d/c Monday and start at clinic Monday after d/c. Will assist as needed.   Olivia Canter Renal Navigator [phone]

## 2023-01-15 NOTE — Progress Notes (Signed)
Occupational Therapy Session Note  Patient Details  Name: Kyle Roberson MRN: [id number] Date of Birth: 23-Aug-1940  Today's Date: 01/15/2023 OT Individual Time: 1st Session 1100-1200, 2nd session 1415-1526 OT Individual Time Calculation (min): 60 min, 71 min    Short Term Goals: Week 2:  OT Short Term Goal 1 (Week 2): STGs=LTGs due to patient's length of stay.  Skilled Therapeutic Interventions/Progress Updates:    Session 1:  Pt seen for skilled OT this am. Stood from w/c x 6 trials sink side for simple self care with close S. OT transported to and from ADL apt for more standing tolerance and balance activity on various floor surfaces with RW support and simple functional reach. Overall max stand tolerance up to 4 minutes with forward and backwards stepping to simulate tighter home space maneuvering and transfers. Once back in room, pt left with all safety measures, needs and nurse call button in regular w/c with LE's elevated.   Pain: R shoulder pain with ktape remaining with no report of pain   Session 2:  Pt seen for skilled OT this pm. Pt expressed interest in accessing outdoors. OT transported via w/c to and from ground floor and back for energy conservation. Pt completed 4 intervals of standing at outdoor railing for 2 min with close S. Seated rest between intervals. Short 25 ft w/c propulsion with min A on R side to avoid sh pain x 2 with rest between. OT transported pt to reflection fountain for short rest and then thru gift shop. Once back on unit, pt amb from bedside w/c to and from sink side and stood for duration of oral care and hand washing with close S. Left w/c level with all safety needs, nurse call button and needs in reach.   Pain: denies pain   Therapy Documentation Precautions:  Precautions Precautions: Fall Restrictions Weight Bearing Restrictions: Yes RLE Weight Bearing: Weight bearing as tolerated LLE Weight Bearing: Weight bearing as  tolerated   Therapy/Group: Individual Therapy  Vicenta Dunning 01/15/2023, 7:49 AM

## 2023-01-15 NOTE — Progress Notes (Addendum)
PROGRESS NOTE   Subjective/Complaints: Patient has been declining his insulin.  Reports increased heartburn yesterday improved today with increase of PPI.  Reports some GI gas.  ROS: Patient denies fever, chills, rash, sore throat, blurred vision, dizziness, nausea, vomiting, diarrhea, cough, shortness of breath or chest pain,  headache, or mood change.   + Back pain-improved after switching to air mattress    Objective:   No results found. Recent Labs    01/14/23 1452  WBC 9.0  HGB 11.5*  HCT 35.6*  PLT 218     Recent Labs    01/14/23 1452  NA 134*  K 4.0  CL 94*  CO2 22  GLUCOSE 162*  BUN 75*  CREATININE 7.58*  CALCIUM 8.8*      Intake/Output Summary (Last 24 hours) at 01/15/2023 1447 Last data filed at 01/15/2023 0733 Gross per 24 hour  Intake 240 ml  Output 500 ml  Net -260 ml        Physical Exam: Vital Signs Blood pressure 110/62, pulse (!) 59, temperature (!) 97.4 F (36.3 C), resp. rate 18, height 5\' 8"  (1.727 m), weight 82 kg, SpO2 100%. Body mass index is 27.49 kg/m.  Constitutional: No distress . Vital signs reviewed. HEENT: NCAT, conjugate gaze, MMM Neck: supple Cardiovascular: RRR, no MRG Respiratory/Chest: CTAB, normal effort, on room air GI/Abdomen: Nondistended, soft, positive bowel sounds Ext: no clubbing, cyanosis, or edema Psych: Pleasant skin: No evidence of breakdown, severe stasis dermatitis changes  TDC R chest wall  Neurologic: Awake and alert working with therapy.  Moving all 4 extremities to gravity  Musculoskeletal: No joint swelling  Assessment/Plan: 1. Functional deficits which require 3+ hours per day of interdisciplinary therapy in a comprehensive inpatient rehab setting. Physiatrist is providing close team supervision and 24 hour management of active medical problems listed below. Physiatrist and rehab team continue to assess barriers to discharge/monitor  patient progress toward functional and medical goals  Care Tool:  Bathing    Body parts bathed by patient: Right arm, Left arm, Chest, Abdomen, Face, Right upper leg, Right lower leg   Body parts bathed by helper: Left upper leg, Left lower leg, Buttocks     Bathing assist Assist Level: Maximal Assistance - Patient 24 - 49%     Upper Body Dressing/Undressing Upper body dressing   What is the patient wearing?: Hospital gown only    Upper body assist Assist Level: Minimal Assistance - Patient > 75%    Lower Body Dressing/Undressing Lower body dressing      What is the patient wearing?: Underwear/pull up     Lower body assist Assist for lower body dressing: Moderate Assistance - Patient 50 - 74%     Toileting Toileting    Toileting assist Assist for toileting: Maximal Assistance - Patient 25 - 49%     Transfers Chair/bed transfer  Transfers assist     Chair/bed transfer assist level: Minimal Assistance - Patient > 75%     Locomotion Ambulation   Ambulation assist   Ambulation activity did not occur: Safety/medical concerns (fatigue, pain)  Assist level: Moderate Assistance - Patient 50 - 74% Assistive device: No Device Max distance: 50 feet  Walk 10 feet activity   Assist  Walk 10 feet activity did not occur: Safety/medical concerns (fatigue, pain)  Assist level: Moderate Assistance - Patient - 50 - 74% Assistive device: No Device   Walk 50 feet activity   Assist Walk 50 feet with 2 turns activity did not occur: Safety/medical concerns  Assist level: Moderate Assistance - Patient - 50 - 74% Assistive device: No Device    Walk 150 feet activity   Assist Walk 150 feet activity did not occur: Safety/medical concerns         Walk 10 feet on uneven surface  activity   Assist Walk 10 feet on uneven surfaces activity did not occur: Safety/medical concerns (fatigue, pain)         Wheelchair     Assist Is the patient using a  wheelchair?: Yes Type of Wheelchair: Manual Wheelchair activity did not occur: Safety/medical concerns (pain, fatigue)  Wheelchair assist level: Minimal Assistance - Patient > 75% Max wheelchair distance: 50    Wheelchair 50 feet with 2 turns activity    Assist    Wheelchair 50 feet with 2 turns activity did not occur: Safety/medical concerns (pain, fatigue)   Assist Level: Minimal Assistance - Patient > 75%   Wheelchair 150 feet activity     Assist  Wheelchair 150 feet activity did not occur: Safety/medical concerns (pain, fatigue)   Assist Level: Maximal Assistance - Patient 25 - 49%   Blood pressure 110/62, pulse (!) 59, temperature (!) 97.4 F (36.3 C), resp. rate 18, height 5\' 8"  (1.727 m), weight 82 kg, SpO2 100%.  Medical Problem List and Plan: 1. Functional deficits secondary to CHF             -patient may not  shower, HD cath             -ELOS/Goals: 18-21d, pt  goal is home by Birthday on 10/29  -Continue CIR therapies including PT, OT   -Est Discharge 01/18/23  -Expected discharge 01/18/2023     2.  Antithrombotics: -DVT/anticoagulation:  Pharmaceutical: Eliquis             -antiplatelet therapy:    3. Pain Management: Tylenol as needed  -Discussed with nursing air mattress-was told he will not qualify  -K-pad ordered   4. Mood/Behavior/Sleep: LCSW to evaluate and provide emotional support             -antipsychotic agents: n/a   5. Neuropsych/cognition: This patient is  capable of making decisions on his  own behalf.   6. Skin/Wound Care: Routine skin care checks, Eucerin cream for stasis dermatitis   7. Fluids/Electrolytes/Nutrition: Strict Is and Os and follow-up chemistries per nephrology             -renal diet with FR             -continue vitamin C, D3, Renavit   8: Hypertension: monitor TID and prn             -continue doxazosin 1 mg BID-continue to hold             -continue Lasix 80 mg daily             -continue hydralazine 25  mg TID  -BP has been somewhat labile, continue to monitor trend.  Monitor for symptoms of orthostatic hypotension, particularly after hemodialysis.  -BP stable overall, order thigh high compression stockings to avoid orthostatic hypotension  -10/17 patient declined use of compression stockings yesterday, BP stable  overall  -10/19 decrease hydralazine to 10 mg 3 times daily  -10/21 BP remains quite soft--hold hydralazine  -10/22 will discuss Lasix with nephrology, BP continues to be all soft   -10/23 Lasix continued after discussion with nephrology, monitor fluid status  10/24 nephrology decreased Imdur to 30 mg daily  10/25 BP controlled    01/15/2023    2:25 PM 01/15/2023    5:00 AM 01/15/2023    4:15 AM  Vitals with BMI  Weight  180 lbs 12 oz   BMI  27.49   Systolic 110  120  Diastolic 62  53  Pulse 59  54     9: Hyperlipidemia: continue statin, Lovaza   10: Right eye infection probable blepharitis: continue Maxitrol 2 wk treatment    11: GERD: continue PPI   12: 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 hypoglycemia  -10/19 patient and wife were very concerned about hypoglycemia this morning, will dose increase Semglee to 14 units, consider increase mealtime insulin  -10/22 decrease Semglee to 12 units  -10/23 decrease Semglee to 9 units today, decrease NovoLog to 2 units with meals to reduce risk of hypoglycemia  -10/24 will decrease Semglee to 6 units, had a hypoglycemic event this a.m. he did get lower dose of insulin today  -10/25 discussed with pharmacy, pharmacy would not recommend oral agent at this time due to his renal function, patient declining any insulin will discontinue scheduled dosing and continue sliding scale insulin, monitor trend for now  CBG  (last 3)  Recent Labs    01/14/23 2055 01/15/23 0625 01/15/23 1200  GLUCAP 114* 92 226*       13: ESRD: HD on T/T/S via right internal jugular TDC  -Reviewed nephrology note-plan for outpatient dialysis at [facility]   14: Chronic diastolic CHF: daily weight (dry weight ~220#)  EF is normal  -(meds as in #8) -fluid balance>>HD -10/21 weights fluctuate with HD  Filed Weights   01/14/23 1420 01/14/23 1900 01/15/23 0500  Weight: 87.8 kg 87 kg 82 kg      15: Atrial fibrillation: on Eliquis - bradycardia , not taking BBs monitor rate  -Avoid negative chronotropic medications  Heart rate stable continue to monitor   16: COPD/acute hypoxic respiratory failure due to volume overload:  -continue Breo Ellipta             -Wean supplemental O2 as able-stable on room air   17: CAD: on statin, meds as in #8   18: Obesity: BMI = 30.17  -Dietary education  -BMI a little improved to 27.49  Body mass index is 27.49 kg/m.    19: Anemia of chronic inflammation, cardiorenal disease ESRD: -continue Epogen 4000 units every TTS on hemodialysis days as per nephrology     20: Bilateral lower extremity lymphedema, chronic: -continue Lotrisone cream twice daily   21.  Fall.  -X-ray knees and hips without fracture or dislocation, arthritic changes noted.  Pain is improving in these areas  22.  GERD: Pantoprazole increased to twice daily 40 mg.  Simethicone started for gas.  Last BM  today 10/25  LOS: 11 days A FACE TO FACE EVALUATION WAS PERFORMED  Fanny Dance 01/15/2023, 2:47 PM

## 2023-01-15 NOTE — Progress Notes (Signed)
New Dialysis Start    Patient identified as new dialysis start. Kidney Education packet assembled and given. Discussed the following items with patient:     Current medications and possible changes once started:  Discussed that patient's medications may change over time.  Ex; hypertension medications and diabetes medication.  Nephrologists will adjust as needed.   Fluid restrictions reviewed:  32 oz daily goal:  All liquids count; soups, ice, jello, fruits. Will also refer dietitian.   Phosphorus and potassium: Handout given showing high potassium and phosphorus foods.  Alternative food and drink options given. Will also refer dietitian.   Family support:  None at bedside currently, but wife is supportive.   Outpatient Clinic Resources:  Discussed roles of Outpatient clinic staff and advised to make a list of needs, if any, to talk with outpatient staff if needed.   Care plan schedule: Informed patient of Care Plans in outpatient setting and to participate in the care plan.  An invitation would be given from outpatient clinic.    Dialysis Access Options:  Reviewed access options with patients. Discussed in detail about care at home with new AVG & AVF. Reviewed checking bruit and thrill. If dialysis catheter present, educated that patient could not take showers.  Catheter dressing changes were to be done by outpatient clinic staff only   Home therapy options:  Educated patient about home therapy options:  PD vs home hemo.     Patient verbalized understanding. Will continue to round on patient during admission.    Jean Rosenthal Dialysis Nurse Coordinator [phone]

## 2023-01-15 NOTE — Progress Notes (Signed)
Patient ID: Kyle Roberson, male   DOB: [birth year], 82 y.o.   MRN: [id number] Secured messaged from tracy-Renal navigator who reports pt will being OP-HD Monday at 11:30 needs to be discharge Monday by 10:00 to be able to get there on time. We'll have HD on Sat according to MD

## 2023-01-15 NOTE — Plan of Care (Signed)

## 2023-01-16 LAB — RENAL FUNCTION PANEL
Albumin: 2.7 g/dL — ABNORMAL LOW (ref 3.5–5.0)
Anion gap: 17 — ABNORMAL HIGH (ref 5–15)
BUN: 56 mg/dL — ABNORMAL HIGH (ref 8–23)
CO2: 21 mmol/L — ABNORMAL LOW (ref 22–32)
Calcium: 8.4 mg/dL — ABNORMAL LOW (ref 8.9–10.3)
Chloride: 95 mmol/L — ABNORMAL LOW (ref 98–111)
Creatinine, Ser: 6.23 mg/dL — ABNORMAL HIGH (ref 0.61–1.24)
GFR, Estimated: 8 mL/min — ABNORMAL LOW (ref >60)
Glucose, Bld: 252 mg/dL — ABNORMAL HIGH (ref 70–99)
Phosphorus: 4.2 mg/dL (ref 2.5–4.6)
Potassium: 4.2 mmol/L (ref 3.5–5.1)
Sodium: 133 mmol/L — ABNORMAL LOW (ref 135–145)

## 2023-01-16 LAB — CBC
HCT: 35.8 % — ABNORMAL LOW (ref 39.0–52.0)
Hemoglobin: 11.9 g/dL — ABNORMAL LOW (ref 13.0–17.0)
MCH: 30.1 pg (ref 26.0–34.0)
MCHC: 33.2 g/dL (ref 30.0–36.0)
MCV: 90.4 fL (ref 80.0–100.0)
Platelets: 192 10*3/uL (ref 150–400)
RBC: 3.96 MIL/uL — ABNORMAL LOW (ref 4.22–5.81)
RDW: 14.1 % (ref 11.5–15.5)
WBC: 8.4 10*3/uL (ref 4.0–10.5)
nRBC: 0 % (ref 0.0–0.2)

## 2023-01-16 LAB — GLUCOSE, CAPILLARY
Glucose-Capillary: 53 mg/dL — ABNORMAL LOW (ref 70–99)
Glucose-Capillary: 127 mg/dL — ABNORMAL HIGH (ref 70–99)
Glucose-Capillary: 253 mg/dL — ABNORMAL HIGH (ref 70–99)
Glucose-Capillary: 61 mg/dL — ABNORMAL LOW (ref 70–99)
Glucose-Capillary: 159 mg/dL — ABNORMAL HIGH (ref 70–99)

## 2023-01-16 MED ORDER — HEPARIN SODIUM (PORCINE) 1000 UNIT/ML IJ SOLN
3200.0000 [IU] | Freq: Once | INTRAMUSCULAR | Status: AC
  Administered 2023-01-16: 3200 [IU]

## 2023-01-16 MED ORDER — HEPARIN SODIUM (PORCINE) 1000 UNIT/ML IJ SOLN
4000.0000 [IU] | Freq: Once | INTRAMUSCULAR | Status: AC
  Administered 2023-01-16: 4000 [IU] via INTRAVENOUS
  Filled 2023-01-16 (×2): qty 4

## 2023-01-16 NOTE — Progress Notes (Signed)
Dietary was in room to take patients dinner and breakfast and lunch for 01/16/23. Patient declined to get order taken and stated that wife will call in order. Cletis Media, RN

## 2023-01-16 NOTE — Progress Notes (Signed)
Pt continually refuses morning CHG baths.  Pt educated on the need for CHG bath.

## 2023-01-16 NOTE — Progress Notes (Signed)
[facility] Progress Note   Subjective:   Patient seen and examined at bedside. No complaints/acute events. Discussed with patient's wife Kyle Roberson on his room phone.  Objective Vitals:   01/15/23 1425 01/15/23 1931 01/16/23 0448 01/16/23 0500  BP: 110/62 (!) 102/50 121/76   Pulse: (!) 59 (!) 50 91   Resp: 18 18 18    Temp: (!) 97.4 F (36.3 C) 97.6 F (36.4 C) 97.6 F (36.4 C)   TempSrc:  Oral Oral   SpO2: 100% 100% 96%   Weight:    82 kg  Height:       Physical Exam General:chronically ill appearing male in NAD, in bed Heart:RRR Lungs: normal wob, unlabored Abdomen:soft, ND Extremities:chronic skin changes, no LE edema Neuro: awake, alert Dialysis Access: [facility] c/d/i  Filed Weights   01/14/23 1900 01/15/23 0500 01/16/23 0500  Weight: 87 kg 82 kg 82 kg    Intake/Output Summary (Last 24 hours) at 01/16/2023 1033 Last data filed at 01/16/2023 0815 Gross per 24 hour  Intake 120 ml  Output --  Net 120 ml    Additional Objective Labs: Basic Metabolic Panel: Recent Labs  Lab 01/09/23 1345 01/14/23 1452  NA 136 134*  K 3.7 4.0  CL 95* 94*  CO2 22 22  GLUCOSE 116* 162*  BUN 79* 75*  CREATININE 6.47* 7.58*  CALCIUM 8.5* 8.8*  PHOS 5.7* 5.1*   Liver Function Tests: Recent Labs  Lab 01/09/23 1345 01/14/23 1452  ALBUMIN 2.9* 2.9*   CBC: Recent Labs  Lab 01/09/23 1345 01/14/23 1452  WBC 10.3 9.0  HGB 10.7* 11.5*  HCT 33.6* 35.6*  MCV 91.3 90.4  PLT 192 218   CBG: Recent Labs  Lab 01/15/23 1200 01/15/23 1611 01/15/23 2026 01/16/23 0559 01/16/23 0652  GLUCAP 226* 158* 153* 53* 127*   Iron Studies:  No results for input(s): "IRON", "TIBC", "TRANSFERRIN", "FERRITIN" in the last 72 hours.  Lab Results  Component Value Date   INR 1.5 (H) 12/20/2022   INR 1.2 12/07/2022   INR 1.6 (H) 12/03/2022   Studies/Results: No results found.  Medications:   (feeding supplement) PROSource Plus  30 mL Oral BID BM   apixaban  2.5 mg Oral  BID   vitamin C  500 mg Oral BID   Chlorhexidine Gluconate Cloth  6 each Topical Q12H   cholecalciferol  2,000 Units Oral Daily   clotrimazole-betamethasone   Topical BID   docusate sodium  100 mg Oral BID   fluticasone furoate-vilanterol  1 puff Inhalation Daily   hydrocerin   Topical TID   insulin aspart  0-15 Units Subcutaneous TID AC & HS   isosorbide mononitrate  30 mg Oral Daily   melatonin  5 mg Oral QHS   multivitamin  1 tablet Oral QHS   neomycin-polymyxin b-dexamethasone  1 Application Right Eye TID   omega-3 acid ethyl esters  1 g Oral Daily   pantoprazole  40 mg Oral BID   rosuvastatin  10 mg Oral Once per day on Monday Wednesday Friday   sevelamer carbonate  1,600 mg Oral TID WC     Kyle Roberson is a 82 y.o. male with (newly declared during [facility] admit) ESRD, T2DM, HTN, CAD, HFpEF, A-fib (on Eliquis), and Hx SDH (01/2021) who was transferred from [facility] to [facility] for ongoing rehab.    Dialysis Orders: Establishing orders.    Assessment/Plan: Debility: Admitted to CIR for ongoing rehab. ESRD:  Continue HD per TTS schedule - Next HD 10/26.  Using [facility]. Have not pursued permanent access at this time, may be hindrance to his therapy sessions to do inpatient, can revisit as an outpatient. His eventual goal is to be able to do PD at home----confirmed with patient's wife 10/25 Hypertension/volume: BP variable, abdominal distention improving, continue UF as tolerated.  Establishing dry weight.  Report standing for weights in HD. Given low BP's Decreased imdur to 30mg  daily 10/24 Anemia of ESRD: no ESA needs at this time. Tsat 19%.  If Hgb starts to drop will order IV iron course. Hgb 11.5 Metabolic bone disease: Renvela 2tabs AC.  Pth 344, in goal for stage of CKD. Last Po4 improving  Nutrition: Alb low, continue renal diet + prot supps. T2DM - management per primary service HFpEF: UF as tolerated, off-HD diuretics d/c'ed given low BPs   Dispo: EDD 10/28, accepted to [facility] MWF 11:45 chair time, start 10/28, will need to be discharged well before that so he can make it to his OP HD unit  Anthony Sar, MD [facility]

## 2023-01-16 NOTE — Progress Notes (Signed)
Tried to order IV albumin for hypotension/3rd spacing. He refuses blood products. He is not Jehovah's witness, no religious reasons. He does not feel confident that blood is screened for disease. Attempted to call his wife at patient's request -> went straight to voicemail and mailbox was full so unable to leave a message. No albumin given.  [name], PA-C [employer] Pager [phone]

## 2023-01-16 NOTE — Progress Notes (Signed)
Received patient in bed to unit.  Alert and oriented.  Informed consent signed and in chart.   TX duration:3.5 hours  Patient tolerated well.  Transported back to the room  Alert, without acute distress.  Hand-off given to patient's nurse.   Access used: R internal jugular HD cath Access issues: A-V and V-A  Total UF removed: 1.5L Medication(s) given: none   01/16/23 1800  Vitals  Temp 97.9 F (36.6 C)  Temp Source Oral  BP 104/63  MAP (mmHg) 75  Pulse Rate 68  ECG Heart Rate 69  Resp (!) 22  Oxygen Therapy  SpO2 95 %  O2 Device Room Air  Patient Activity (if Appropriate) In chair  Pulse Oximetry Type Continuous  During Treatment Monitoring  HD Safety Checks Performed Yes  Intra-Hemodialysis Comments Tx completed;Tolerated well  Dialysis Fluid Bolus Normal Saline  Bolus Amount (mL) 300 mL  Hemodialysis Catheter Right Internal jugular Double lumen Permanent (Tunneled)  Placement Date/Time: 12/16/22 1448   Serial / Lot #: [id number]  Expiration Date: 06/21/27  Time Out: Correct patient;Correct site;Correct procedure  Maximum sterile barrier precautions: Hand hygiene;Cap;Mask;Sterile gown;Sterile gloves;Sterile probe c...  Site Condition No complications  Blue Lumen Status Flushed;Heparin locked;Dead end cap in place  Red Lumen Status Flushed;Heparin locked;Dead end cap in place  Purple Lumen Status N/A  Catheter fill solution Heparin 1000 units/ml  Catheter fill volume (Arterial) 1.6 cc  Catheter fill volume (Venous) 1.6  Dressing Type Transparent  Dressing Status Antimicrobial disc in place;Clean, Dry, Intact  Interventions  (deaccessed)  Drainage Description None  Dressing Change Due 01/21/23  Post treatment catheter status Capped and Clamped     Stacie Glaze LPN [facility]

## 2023-01-17 LAB — GLUCOSE, CAPILLARY
Glucose-Capillary: 178 mg/dL — ABNORMAL HIGH (ref 70–99)
Glucose-Capillary: 85 mg/dL (ref 70–99)
Glucose-Capillary: 135 mg/dL — ABNORMAL HIGH (ref 70–99)

## 2023-01-17 NOTE — Progress Notes (Signed)
Physical Therapy Session Note  Patient Details  Name: Decorey Dispenza MRN: [id number] Date of Birth: Jul 31, 1940  Today's Date: 01/17/2023 PT Missed Time: 60 Minutes Missed Time Reason: Patient unwilling to participate  Short Term Goals: Week 2:  PT Short Term Goal 1 (Week 2): STG=LTG 2/2 ELOS  Skilled Therapeutic Interventions/Progress Updates:      Therapy Documentation Precautions:  Precautions Precautions: Fall Restrictions Weight Bearing Restrictions: Yes RLE Weight Bearing: Weight bearing as tolerated LLE Weight Bearing: Weight bearing as tolerated General: PT Amount of Missed Time (min): 60 Minutes PT Missed Treatment Reason: Patient unwilling to participate  Pt refused PT session due to fatigue and not feeling "well" after HD and little sleep. PT provided max encouragement to participate and pt continued to decline stating he would try therapies later in the day. PT assessed pain interference in  preparation for discharge.     Therapy/Group: Individual Therapy  Truitt Leep Truitt Leep PT, DPT  01/17/2023, 12:16 PM

## 2023-01-17 NOTE — Progress Notes (Signed)
[facility] Progress Note   Subjective:   Patient seen and examined at bedside. No complaints/acute events. He didn't like being up in the dialysis unit yesterday. Did have some hypotension with HD however refused albumin apparently--we discussed the rationale behind this today. Net uf 1.5L  Objective Vitals:   01/16/23 1808 01/16/23 1839 01/16/23 2131 01/17/23 0518  BP:  (!) 141/62 (!) 107/54 (!) 125/59  Pulse:  (!) 53 65 (!) 56  Resp:  18 18 16   Temp:  98.7 F (37.1 C) 97.6 F (36.4 C) 98.1 F (36.7 C)  TempSrc:      SpO2:  100% 98% 99%  Weight: 86.6 kg     Height:       Physical Exam General:chronically ill appearing male in NAD, in bed Heart:RRR Lungs: normal wob, unlabored, cta b/l Abdomen:soft, slightly distended Extremities:chronic skin changes, no LE pitting edema Neuro: awake, alert Dialysis Access: [facility] c/d/i  Filed Weights   01/16/23 0500 01/16/23 1404 01/16/23 1808  Weight: 82 kg 88.1 kg 86.6 kg    Intake/Output Summary (Last 24 hours) at 01/17/2023 0938 Last data filed at 01/16/2023 1806 Gross per 24 hour  Intake 240 ml  Output 1500 ml  Net -1260 ml    Additional Objective Labs: Basic Metabolic Panel: Recent Labs  Lab 01/14/23 1452 01/16/23 1144  NA 134* 133*  K 4.0 4.2  CL 94* 95*  CO2 22 21*  GLUCOSE 162* 252*  BUN 75* 56*  CREATININE 7.58* 6.23*  CALCIUM 8.8* 8.4*  PHOS 5.1* 4.2   Liver Function Tests: Recent Labs  Lab 01/14/23 1452 01/16/23 1144  ALBUMIN 2.9* 2.7*   CBC: Recent Labs  Lab 01/14/23 1452 01/16/23 1144  WBC 9.0 8.4  HGB 11.5* 11.9*  HCT 35.6* 35.8*  MCV 90.4 90.4  PLT 218 192   CBG: Recent Labs  Lab 01/16/23 0559 01/16/23 0652 01/16/23 1150 01/16/23 1840 01/16/23 2133  GLUCAP 53* 127* 253* 61* 159*   Iron Studies:  No results for input(s): "IRON", "TIBC", "TRANSFERRIN", "FERRITIN" in the last 72 hours.  Lab Results  Component Value Date   INR 1.5 (H) 12/20/2022   INR 1.2  12/07/2022   INR 1.6 (H) 12/03/2022   Studies/Results: No results found.  Medications:   (feeding supplement) PROSource Plus  30 mL Oral BID BM   apixaban  2.5 mg Oral BID   vitamin C  500 mg Oral BID   Chlorhexidine Gluconate Cloth  6 each Topical Q12H   cholecalciferol  2,000 Units Oral Daily   clotrimazole-betamethasone   Topical BID   docusate sodium  100 mg Oral BID   fluticasone furoate-vilanterol  1 puff Inhalation Daily   hydrocerin   Topical TID   insulin aspart  0-15 Units Subcutaneous TID AC & HS   isosorbide mononitrate  30 mg Oral Daily   melatonin  5 mg Oral QHS   multivitamin  1 tablet Oral QHS   neomycin-polymyxin b-dexamethasone  1 Application Right Eye TID   omega-3 acid ethyl esters  1 g Oral Daily   pantoprazole  40 mg Oral BID   rosuvastatin  10 mg Oral Once per day on Monday Wednesday Friday   sevelamer carbonate  1,600 mg Oral TID WC     Kyle Roberson is a 82 y.o. male with (newly declared during [facility] admit) ESRD, T2DM, HTN, CAD, HFpEF, A-fib (on Eliquis), and Hx SDH (01/2021) who was transferred from [facility] to [facility] for ongoing rehab.    Dialysis  Orders: Establishing orders.    Assessment/Plan: Debility: Admitted to CIR for ongoing rehab. ESRD:  HD on TTS schedule .  Using [facility]. Have not pursued permanent access at this time, may be hindrance to his therapy sessions to do inpatient, can revisit as an outpatient. His eventual goal is to be able to do PD at home----confirmed with patient's wife. Will transition to MWF schedule given outpatient plan. Plan for HD tomorrow if discharge is delayed (patient wishes to do dialysis as an outpatient tomorrow) Hypertension/volume: BP variable, abdominal distention improving, continue UF as tolerated.  Establishing dry weight.  Report standing for weights in HD. Given low BP's Decreased imdur to 30mg  daily 10/24 Anemia of ESRD: no ESA needs at this time. Tsat 19%.  If Hgb starts to drop will order IV iron course. Hgb  11.9 Metabolic bone disease: Renvela 2tabs AC.  Pth 344, in goal for stage of CKD. Last Po4 improving  Nutrition: Alb low, continue renal diet + prot supps. T2DM - management per primary service HFpEF: UF as tolerated, off-HD diuretics d/c'ed given low BPs   Dispo: EDD 10/28, accepted to [facility] MWF 11:45 chair time, start 10/28, will need to be discharged well before that so he can make it to his OP HD unit  Kyle Sar, MD [facility]

## 2023-01-17 NOTE — Plan of Care (Signed)
  Problem: RH Balance Goal: LTG: Patient will maintain dynamic sitting balance (OT) Description: LTG:  Patient will maintain dynamic sitting balance with assistance during activities of daily living (OT) Outcome: Completed/Met Goal: LTG Patient will maintain dynamic standing with ADLs (OT) Description: LTG:  Patient will maintain dynamic standing balance with assist during activities of daily living (OT)  Outcome: Completed/Met   Problem: Sit to Stand Goal: LTG:  Patient will perform sit to stand in prep for activites of daily living with assistance level (OT) Description: LTG:  Patient will perform sit to stand in prep for activites of daily living with assistance level (OT) Outcome: Completed/Met   Problem: RH Grooming Goal: LTG Patient will perform grooming w/assist,cues/equip (OT) Description: LTG: Patient will perform grooming with assist, with/without cues using equipment (OT) Outcome: Completed/Met   Problem: RH Bathing Goal: LTG Patient will bathe all body parts with assist levels (OT) Description: LTG: Patient will bathe all body parts with assist levels (OT) Outcome: Completed/Met   Problem: RH Dressing Goal: LTG Patient will perform upper body dressing (OT) Description: LTG Patient will perform upper body dressing with assist, with/without cues (OT). Outcome: Completed/Met Goal: LTG Patient will perform lower body dressing w/assist (OT) Description: LTG: Patient will perform lower body dressing with assist, with/without cues in positioning using equipment (OT) Outcome: Completed/Met   Problem: RH Toileting Goal: LTG Patient will perform toileting task (3/3 steps) with assistance level (OT) Description: LTG: Patient will perform toileting task (3/3 steps) with assistance level (OT)  Outcome: Completed/Met   Problem: RH Functional Use of Upper Extremity Goal: LTG Patient will use RT/LT upper extremity as a (OT) Description: LTG: Patient will use right/left upper  extremity as a stabilizer/gross assist/diminished/nondominant/dominant level with assist, with/without cues during functional activity (OT) Outcome: Completed/Met   Problem: RH Toilet Transfers Goal: LTG Patient will perform toilet transfers w/assist (OT) Description: LTG: Patient will perform toilet transfers with assist, with/without cues using equipment (OT) Outcome: Completed/Met   Problem: RH Tub/Shower Transfers Goal: LTG Patient will perform tub/shower transfers w/assist (OT) Description: LTG: Patient will perform tub/shower transfers with assist, with/without cues using equipment (OT) Outcome: Completed/Met

## 2023-01-17 NOTE — Progress Notes (Signed)
Occupational Therapy Discharge Summary  Patient Details  Name: Kyle Roberson MRN: [id number] Date of Birth: 1940-05-20  Date of Discharge from OT service:January 17, 2023  Today's Date: 01/17/2023 OT Individual Time: 1105-1200 OT Individual Time Calculation (min): 55 min    Patient has met 11 of 11 long term goals due to improved activity tolerance, improved balance, ability to compensate for deficits, and functional use of  RIGHT lower and LEFT lower extremity.  Patient to discharge at overall Supervision level.  Patient's care partner is independent to provide the necessary physical and cognitive assistance at discharge.    Reasons goals not met: NA  Recommendation:  Patient will benefit from ongoing skilled OT services in home health setting to continue to advance functional skills in the area of BADL, iADL, and Reduce care partner burden.  Equipment: Long-handled sponge  Reasons for discharge: treatment goals met and discharge from hospital  Patient/family agrees with progress made and goals achieved: Yes  OT Discharge Precautions/Restrictions  Precautions Precautions: Fall Pain Pain Assessment Pain Scale: 0-10 Pain Score: 10-Worst pain ever (Pt reports a "30.") Pain Type: Acute pain Pain Location: Shoulder Pain Orientation: Right Pain Descriptors / Indicators: Aching Pain Intervention(s): Repositioned ADL ADL Equipment Provided: Reacher, Sock aid, Long-handled sponge Eating: Supervision/safety Where Assessed-Eating: Wheelchair, Bed level Grooming: Modified independent Where Assessed-Grooming: Sitting at sink Upper Body Bathing: Supervision/safety Where Assessed-Upper Body Bathing: Sitting at sink, Edge of bed Lower Body Bathing: Supervision/safety, Minimal assistance Where Assessed-Lower Body Bathing: Sitting at sink, Standing at sink, Edge of bed Upper Body Dressing: Supervision/safety Where Assessed-Upper Body Dressing: Edge of bed Lower Body Dressing:  Minimal assistance Where Assessed-Lower Body Dressing: Edge of bed Toileting: Supervision/safety Where Assessed-Toileting: Bedside Commode, Toilet Toilet Transfer: Close supervision Toilet Transfer Method: Proofreader: Other (comment), Bedside commode (RW) Tub/Shower Transfer: Unable to assess Film/video editor: Close supervision Film/video editor Method: Designer, industrial/product: Information systems manager without back, Grab bars, Other (comment) (RW) ADL Comments: mod A SPT to toilet, max A LB, min A UB, grooming set up w/c level sinkside Vision Baseline Vision/History: 1 Wears glasses (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 Brief Interview for Mental Status (BIMS) Repetition of Three Words (First Attempt): 3 Temporal Orientation: Year: Missed by more than 5 years (Answers 2084) Temporal Orientation: Month: Accurate within 5 days Temporal Orientation: Day: Correct Recall: "Sock": Yes, no cue required Recall: "Blue": Yes, after cueing ("a color") Recall: "Bed": Yes, no cue required BIMS Summary Score: 11 Sensation Sensation Light Touch: Impaired by gross assessment Peripheral sensation comments: Decreased sensation from bilateral ankles and below. Hard and callused skin. Hot/Cold: Appears Intact Proprioception: Appears Intact Stereognosis: Appears Intact Coordination Gross Motor Movements are Fluid and Coordinated: No Fine Motor Movements are Fluid and Coordinated: No Coordination and Movement  Description: Global weakness/deconditioning. Motor  Motor Motor: Within Functional Limits Motor - Discharge Observations: Global weakness/deconditioning. Mobility  Transfers Sit to Stand: Supervision/Verbal cueing Stand to Sit: Supervision/Verbal cueing  Trunk/Postural Assessment  Cervical Assessment Cervical Assessment: Exceptions to [facility] (forward head) Thoracic Assessment Thoracic Assessment: Exceptions to [facility] (mild kyphosis) Lumbar Assessment Lumbar Assessment: Exceptions to [facility] (posterior pelvic tilt) Postural Control Postural Control: Within Functional  Limits  Balance Balance Balance Assessed: Yes Static Sitting Balance Static Sitting - Balance Support: Feet supported Static Sitting - Level of Assistance: 5: Stand by assistance Dynamic Sitting Balance Dynamic Sitting - Balance Support: During functional activity Dynamic Sitting - Level of Assistance: 5: Stand by assistance Dynamic Sitting - Balance Activities: Lateral lean/weight shifting;Forward lean/weight shifting;Reaching for objects Static Standing Balance Static Standing - Balance Support: Bilateral upper extremity supported Static Standing - Level of Assistance: 5: Stand by assistance Dynamic Standing Balance Dynamic Standing - Balance Support: During functional activity Dynamic Standing - Level of Assistance: 5: Stand by assistance Dynamic Standing - Balance Activities: Lateral lean/weight shifting;Forward lean/weight shifting;Reaching for objects Extremity/Trunk Assessment RUE Assessment RUE Assessment: Exceptions to [facility] Active Range of Motion (AROM) Comments: Deficits due to pain. General Strength Comments: 2-/5 RUE AROM (degrees) Overall AROM Right Upper Extremity: Due to pain;Deficits RUE Strength RUE Overall Strength: Deficits;Due to pain LUE Assessment LUE Assessment: Exceptions to WFL LUE AROM (degrees) Overall AROM Left Upper Extremity: Deficits;Due to premorbid status LUE Strength LUE Overall  Strength: Deficits;Due to premorbid status LUE Overall Strength Comments: 2-/5  Skilled Intervention: Pt received resting in bed for skilled OT session with focus on BADL participation and discharge planning. Pt agreeable to interventions, demonstrating overall pleasant mood. Pt reporting "30" out of 10 pain in R-shoulder, heat offered/declined. OT offering intermediate rest breaks and positioning suggestions throughout session to address pain/fatigue and maximize participation/safety in session.   Pt comes to EOB with HOB elevated and increased time. At EOB, pt requires Min A to thread brief/pants, standing with Min A + RW (due to compliant air-mattress), close supervision for hiking over bottom/hips. Pt dons t-shirt with increased time. Pt performs stand-step transfer from EOB>WC with supervision + RW. Sitting in WC at sink-side, pt cleans his face with distant supervision.   Pt remained sitting in [facility] with all immediate needs met at end of session. Pt continues to be appropriate for skilled OT intervention to promote further functional independence.   Lou Cal, OTR/L, MSOT  01/17/2023, 11:46 AM

## 2023-01-17 NOTE — Progress Notes (Signed)
PROGRESS NOTE   Subjective/Complaints: No new complaints this morning Appreciate nephrology following Refused albumin Discussed plan for d/c tomorrow.   ROS: Patient denies fever, chills, rash, sore throat, blurred vision, dizziness, nausea, vomiting, diarrhea, cough, shortness of breath or chest pain,  headache, or mood change.   + Back pain-improved after switching to air mattress    Objective:   No results found. Recent Labs    01/14/23 1452 01/16/23 1144  WBC 9.0 8.4  HGB 11.5* 11.9*  HCT 35.6* 35.8*  PLT 218 192     Recent Labs    01/14/23 1452 01/16/23 1144  NA 134* 133*  K 4.0 4.2  CL 94* 95*  CO2 22 21*  GLUCOSE 162* 252*  BUN 75* 56*  CREATININE 7.58* 6.23*  CALCIUM 8.8* 8.4*      Intake/Output Summary (Last 24 hours) at 01/17/2023 0956 Last data filed at 01/17/2023 0700 Gross per 24 hour  Intake 496 ml  Output 1500 ml  Net -1004 ml        Physical Exam: Vital Signs Blood pressure (!) 125/59, pulse (!) 56, temperature 98.1 F (36.7 C), resp. rate 16, height 5\' 8"  (1.727 m), weight 86.6 kg, SpO2 99%. Body mass index is 29.03 kg/m.  Constitutional: No distress . Vital signs reviewed. HEENT: NCAT, conjugate gaze, MMM Neck: supple Cardiovascular: Bradycardic Respiratory/Chest: CTAB, normal effort, on room air GI/Abdomen: Nondistended, soft, positive bowel sounds Ext: no clubbing, cyanosis, or edema Psych: Pleasant skin: No evidence of breakdown, severe stasis dermatitis changes  TDC R chest wall  Neurologic: Awake and alert working with therapy.  Moving all 4 extremities to gravity  Musculoskeletal: No joint swelling  Assessment/Plan: 1. Functional deficits which require 3+ hours per day of interdisciplinary therapy in a comprehensive inpatient rehab setting. Physiatrist is providing close team supervision and 24 hour management of active medical problems listed  below. Physiatrist and rehab team continue to assess barriers to discharge/monitor patient progress toward functional and medical goals  Care Tool:  Bathing    Body parts bathed by patient: Right arm, Left arm, Chest, Abdomen, Face, Right upper leg, Right lower leg   Body parts bathed by helper: Left upper leg, Left lower leg, Buttocks     Bathing assist Assist Level: Maximal Assistance - Patient 24 - 49%     Upper Body Dressing/Undressing Upper body dressing   What is the patient wearing?: Hospital gown only    Upper body assist Assist Level: Minimal Assistance - Patient > 75%    Lower Body Dressing/Undressing Lower body dressing      What is the patient wearing?: Underwear/pull up     Lower body assist Assist for lower body dressing: Moderate Assistance - Patient 50 - 74%     Toileting Toileting    Toileting assist Assist for toileting: Maximal Assistance - Patient 25 - 49%     Transfers Chair/bed transfer  Transfers assist     Chair/bed transfer assist level: Minimal Assistance - Patient > 75%     Locomotion Ambulation   Ambulation assist   Ambulation activity did not occur: Safety/medical concerns (fatigue, pain)  Assist level: Moderate Assistance - Patient 50 - 74%  Assistive device: No Device Max distance: 50 feet   Walk 10 feet activity   Assist  Walk 10 feet activity did not occur: Safety/medical concerns (fatigue, pain)  Assist level: Moderate Assistance - Patient - 50 - 74% Assistive device: No Device   Walk 50 feet activity   Assist Walk 50 feet with 2 turns activity did not occur: Safety/medical concerns  Assist level: Moderate Assistance - Patient - 50 - 74% Assistive device: No Device    Walk 150 feet activity   Assist Walk 150 feet activity did not occur: Safety/medical concerns         Walk 10 feet on uneven surface  activity   Assist Walk 10 feet on uneven surfaces activity did not occur: Safety/medical concerns  (fatigue, pain)         Wheelchair     Assist Is the patient using a wheelchair?: Yes Type of Wheelchair: Manual Wheelchair activity did not occur: Safety/medical concerns (pain, fatigue)  Wheelchair assist level: Minimal Assistance - Patient > 75% Max wheelchair distance: 50    Wheelchair 50 feet with 2 turns activity    Assist    Wheelchair 50 feet with 2 turns activity did not occur: Safety/medical concerns (pain, fatigue)   Assist Level: Minimal Assistance - Patient > 75%   Wheelchair 150 feet activity     Assist  Wheelchair 150 feet activity did not occur: Safety/medical concerns (pain, fatigue)   Assist Level: Maximal Assistance - Patient 25 - 49%   Blood pressure (!) 125/59, pulse (!) 56, temperature 98.1 F (36.7 C), resp. rate 16, height 5\' 8"  (1.727 m), weight 86.6 kg, SpO2 99%.  Medical Problem List and Plan: 1. Functional deficits secondary to CHF             -patient may not  shower, HD cath             -ELOS/Goals: 18-21d, pt  goal is home by Birthday on 10[birth year]  -Continue CIR therapies including PT, OT   -Est Discharge 01/18/23  -Expected discharge 01/18/2023     2.  Antithrombotics: -DVT/anticoagulation:  Pharmaceutical: Eliquis             -antiplatelet therapy:    3. Pain Management: Tylenol as needed  -Discussed with nursing air mattress-was told he will not qualify  -K-pad ordered   4. Mood/Behavior/Sleep: LCSW to evaluate and provide emotional support             -antipsychotic agents: n/a   5. Neuropsych/cognition: This patient is  capable of making decisions on his  own behalf.   6. Skin/Wound Care: Routine skin care checks, Eucerin cream for stasis dermatitis   7. Fluids/Electrolytes/Nutrition: Strict Is and Os and follow-up chemistries per nephrology             -renal diet with FR             -continue vitamin C, D3, Renavit   8: Hypertension: monitor TID and prn             -continue doxazosin 1 mg BID-continue to  hold             -continue Lasix 80 mg daily             -continue hydralazine 25 mg TID  -BP has been somewhat labile, continue to monitor trend.  Monitor for symptoms of orthostatic hypotension, particularly after hemodialysis.  -BP stable overall, order thigh high compression stockings to avoid orthostatic hypotension  -  10/17 patient declined use of compression stockings yesterday, BP stable overall  -10/19 decrease hydralazine to 10 mg 3 times daily  -10/21 BP remains quite soft--hold hydralazine  -10/22 will discuss Lasix with nephrology, BP continues to be all soft   -10/23 Lasix continued after discussion with nephrology, monitor fluid status  10/24 nephrology decreased Imdur to 30 mg daily  10/25 BP controlled    01/17/2023    5:18 AM 01/16/2023    9:31 PM 01/16/2023    6:39 PM  Vitals with BMI  Systolic 125 107 956  Diastolic 59 54 62  Pulse 56 65 53     9: Hyperlipidemia: continue statin, Lovaza   10: Right eye infection probable blepharitis: continue Maxitrol 2 wk treatment    11: GERD: continue PPI   12: 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 hypoglycemia  -10/19 patient and wife were very concerned about hypoglycemia this morning, will dose increase Semglee to 14 units, consider increase mealtime insulin  -10/22 decrease Semglee to 12 units  -10/23 decrease Semglee to 9 units today, decrease NovoLog to 2 units with meals to reduce risk of hypoglycemia  -10/24 will decrease Semglee to 6 units, had a hypoglycemic event this a.m. he did get lower dose of insulin today  -10/25 discussed with pharmacy, pharmacy would not recommend oral agent at this time due to his renal function, patient declining any insulin will discontinue scheduled dosing and continue  sliding scale insulin, monitor trend for now  CBG (last 3)  Recent Labs    01/16/23 1150 01/16/23 1840 01/16/23 2133  GLUCAP 253* 61* 159*       13: ESRD: HD on T/T/S via right internal jugular TDC  -Reviewed nephrology note-plan for outpatient dialysis at [facility]   14: Chronic diastolic CHF: daily weight (dry weight ~220#)  EF is normal  -(meds as in #8) -fluid balance>>HD -10/21 weights fluctuate with HD  Filed Weights   01/16/23 0500 01/16/23 1404 01/16/23 1808  Weight: 82 kg 88.1 kg 86.6 kg      15: Atrial fibrillation: on Eliquis - bradycardia , not taking BBs monitor rate  -Avoid negative chronotropic medications  Heart rate stable continue to monitor   16: COPD/acute hypoxic respiratory failure due to volume overload:  -continue Breo Ellipta             -Wean supplemental O2 as able-stable on room air   17: CAD: on statin, meds as in #8   18: Obesity: BMI = 30.17  -Dietary education  -BMI a little improved to 27.49  Body mass index is 29.03 kg/m.    19: Anemia of chronic inflammation, cardiorenal disease ESRD: -continue Epogen 4000 units every TTS on hemodialysis days as per nephrology     20: Bilateral lower extremity lymphedema, chronic: -continue Lotrisone cream twice daily   21.  Fall.  -X-ray knees and hips without fracture or dislocation, arthritic changes noted.  Pain is improving in these areas  22.  GERD: Pantoprazole increased to twice daily 40 mg.  Simethicone started for gas.  Continue these.   23.  Bradycardia: medications reviewed and he is not on any rate control agents.   24. Constipation: last BM reviewed and was 10/25, messaged nursing to ensure accuracy. Continue colace 100mg  BID  LOS: 13 days A FACE TO FACE EVALUATION WAS PERFORMED  Drema Pry Oluwatobiloba Martin 01/17/2023, 9:56 AM

## 2023-01-18 DIAGNOSIS — D631 Anemia in chronic kidney disease: Secondary | ICD-10-CM

## 2023-01-18 DIAGNOSIS — K5900 Constipation, unspecified: Secondary | ICD-10-CM

## 2023-01-18 LAB — GLUCOSE, CAPILLARY
Glucose-Capillary: 106 mg/dL — ABNORMAL HIGH (ref 70–99)
Glucose-Capillary: 93 mg/dL (ref 70–99)

## 2023-01-18 MED ORDER — ACETAMINOPHEN 325 MG PO TABS: 325.0000 mg | ORAL_TABLET | ORAL | Status: DC | PRN

## 2023-01-18 MED ORDER — CLOTRIMAZOLE-BETAMETHASONE 1-0.05 % EX CREA: TOPICAL_CREAM | Freq: Two times a day (BID) | CUTANEOUS | 1 refills | Status: DC

## 2023-01-18 MED ORDER — ISOSORBIDE MONONITRATE ER 30 MG PO TB24: 30.0000 mg | ORAL_TABLET | Freq: Every day | ORAL | Status: DC

## 2023-01-18 MED ORDER — NEOMYCIN-POLYMYXIN-DEXAMETH 3.5-10000-0.1 OP OINT: 1.0000 | TOPICAL_OINTMENT | Freq: Three times a day (TID) | OPHTHALMIC | 1 refills | Status: DC

## 2023-01-18 MED ORDER — HYDROCERIN EX CREA: 1.0000 | TOPICAL_CREAM | Freq: Three times a day (TID) | CUTANEOUS | Status: DC

## 2023-01-18 MED ORDER — RENA-VITE PO TABS: 1.0000 | ORAL_TABLET | Freq: Every day | ORAL | 0 refills | Status: DC

## 2023-01-18 MED ORDER — PANTOPRAZOLE SODIUM 40 MG PO TBEC: 40.0000 mg | DELAYED_RELEASE_TABLET | Freq: Two times a day (BID) | ORAL | 0 refills | Status: DC

## 2023-01-18 MED ORDER — SEVELAMER CARBONATE 800 MG PO TABS: 1600.0000 mg | ORAL_TABLET | Freq: Three times a day (TID) | ORAL | 0 refills | Status: DC

## 2023-01-18 NOTE — Progress Notes (Signed)
Pt to d/c today. Contacted [facility] and spoke to FA. Clinic aware that plan remains for pt to d/c this morning and to arrive at clinic by 11:30. Advised clinic that d/c summary is not available at this time and will fax to clinic once it is available. Spoke to wife on Friday and HD arrangements are on AVS as well.   Olivia Canter Renal Navigator [phone]

## 2023-01-18 NOTE — Plan of Care (Signed)
  Problem: RH Balance Goal: LTG Patient will maintain dynamic standing balance (PT) Description: LTG:  Patient will maintain dynamic standing balance with assistance during mobility activities (PT) Outcome: Completed/Met   Problem: Sit to Stand Goal: LTG:  Patient will perform sit to stand with assistance level (PT) Description: LTG:  Patient will perform sit to stand with assistance level (PT) Outcome: Completed/Met   Problem: RH Bed Mobility Goal: LTG Patient will perform bed mobility with assist (PT) Description: LTG: Patient will perform bed mobility with assistance, with/without cues (PT). Outcome: Completed/Met   Problem: RH Bed to Chair Transfers Goal: LTG Patient will perform bed/chair transfers w/assist (PT) Description: LTG: Patient will perform bed to chair transfers with assistance (PT). Outcome: Completed/Met   Problem: RH Car Transfers Goal: LTG Patient will perform car transfers with assist (PT) Description: LTG: Patient will perform car transfers with assistance (PT). Outcome: Completed/Met   Problem: RH Ambulation Goal: LTG Patient will ambulate in controlled environment (PT) Description: LTG: Patient will ambulate in a controlled environment, # of feet with assistance (PT). Outcome: Completed/Met Goal: LTG Patient will ambulate in home environment (PT) Description: LTG: Patient will ambulate in home environment, # of feet with assistance (PT). Outcome: Completed/Met   Problem: RH Wheelchair Mobility Goal: LTG Patient will propel w/c in controlled environment (PT) Description: LTG: Patient will propel wheelchair in controlled environment, # of feet with assist (PT) Outcome: Completed/Met Goal: LTG Patient will propel w/c in home environment (PT) Description: LTG: Patient will propel wheelchair in home environment, # of feet with assistance (PT). Outcome: Completed/Met

## 2023-01-18 NOTE — Progress Notes (Signed)
Spoke with Morrie Sheldon, RN. Patient with probable discharge today. No IV Team consult/assessment needed at this time. Will place consult if needed.

## 2023-01-18 NOTE — Progress Notes (Signed)
Patient refused bedtime insulin coverage and this morning refused CHG bath.

## 2023-01-18 NOTE — Progress Notes (Signed)
Physical Therapy Discharge Summary  Patient Details  Name: Kyle Roberson MRN: [id number] Date of Birth: 1940-11-29  Date of Discharge from PT service:January 17, 2023   Patient has met 9 of 9 long term goals due to improved activity tolerance, improved balance, increased strength, decreased pain, ability to compensate for deficits, improved attention, improved awareness, and improved coordination.  Patient to discharge at an ambulatory level Supervision.   Patient's care partner is independent to provide the necessary physical assistance at discharge.  Recommendation:  Patient will benefit from ongoing skilled PT services in outpatient setting to continue to advance safe functional mobility, address ongoing impairments in strength, balance, gait, endurance, and minimize fall risk.  Equipment: Pt has RW and WC, recommending use of RW with ambulation.   Reasons for discharge: treatment goals met and discharge from hospital  Patient/family agrees with progress made and goals achieved: Yes  PT Discharge Precautions/Restrictions Precautions Precautions: Fall Restrictions Weight Bearing Restrictions: No RLE Weight Bearing: Weight bearing as tolerated LLE Weight Bearing: Weight bearing as tolerated Pain Interference Pain Interference Pain Effect on Sleep: 1. Rarely or not at all Pain Interference with Therapy Activities: 1. Rarely or not at all Pain Interference with Day-to-Day Activities: 2. Occasionally Vision/Perception  Vision - History Ability to See in Adequate Light: 1 Impaired Perception Perception: Within Functional Limits Praxis Praxis: WFL  Cognition Overall Cognitive Status: Within Functional Limits for tasks assessed Arousal/Alertness: Awake/alert Orientation Level: Oriented X4 Memory: Appears intact Awareness: Impaired Problem Solving: Impaired Safety/Judgment: Impaired Comments: Decreased insight into deficits and carryover of education  provided. Sensation Sensation Light Touch: Impaired by gross assessment Peripheral sensation comments: Decreased sensation from bilateral ankles and below. Hard and callused skin. Light Touch Impaired Details: Impaired LLE;Impaired RLE Hot/Cold: Appears Intact Proprioception: Appears Intact Stereognosis: Appears Intact Coordination Gross Motor Movements are Fluid and Coordinated: No Fine Motor Movements are Fluid and Coordinated: No Coordination and Movement Description: Global weakness/deconditioning. Motor  Motor Motor: Within Functional Limits Motor - Discharge Observations: Global weakness/deconditioning.  Mobility Bed Mobility Bed Mobility: Rolling Right;Rolling Left;Supine to Sit;Sit to Supine Rolling Right: Independent with assistive device Rolling Left: Independent with assistive device Supine to Sit: Independent with assistive device Sit to Supine: Independent with assistive device Transfers Transfers: Sit to Stand;Stand to Sit;Stand Pivot Transfers Sit to Stand: Supervision/Verbal cueing Stand to Sit: Supervision/Verbal cueing Stand Pivot Transfers: Supervision/Verbal cueing Stand Pivot Transfer Details: Verbal cues for precautions/safety;Verbal cues for safe use of DME/AE Transfer (Assistive device): Rolling walker Locomotion  Gait Ambulation: Yes Gait Assistance: Supervision/Verbal cueing Gait Distance (Feet): 150 Feet Assistive device: Rolling walker Gait Assistance Details: Verbal cues for safe use of DME/AE;Verbal cues for precautions/safety Gait Gait: Yes Gait Pattern: Impaired Gait Pattern: Step-to pattern;Decreased step length - right;Decreased step length - left Gait velocity: decreased Stairs / Additional Locomotion Stairs: Yes Stairs Assistance: Contact Guard/Touching assist Stair Management Technique: Two rails Number of Stairs: 8 Height of Stairs: 3 Ramp: Supervision/Verbal cueing Wheelchair Mobility Wheelchair Mobility: Yes Wheelchair  Assistance: Doctor, general practice: Both upper extremities Wheelchair Parts Management: Needs assistance Distance: 150  Trunk/Postural Assessment  Cervical Assessment Cervical Assessment: Exceptions to [facility] (forward head) Thoracic Assessment Thoracic Assessment: Exceptions to [facility] (mild kyphosis) Lumbar Assessment Lumbar Assessment: Exceptions to [facility] (posterior pelvic tilt) Postural Control Postural Control: Within Functional Limits  Balance Balance Balance Assessed: Yes Static Sitting Balance Static Sitting - Balance Support: Feet supported Static Sitting - Level of Assistance: 5: Stand by assistance Dynamic Sitting Balance Dynamic Sitting - Balance Support: During  functional activity Dynamic Sitting - Level of Assistance: 5: Stand by assistance Dynamic Sitting - Balance Activities: Lateral lean/weight shifting;Forward lean/weight shifting;Reaching for objects Static Standing Balance Static Standing - Balance Support: Bilateral upper extremity supported Static Standing - Level of Assistance: 5: Stand by assistance Dynamic Standing Balance Dynamic Standing - Balance Support: During functional activity Dynamic Standing - Level of Assistance: 5: Stand by assistance Dynamic Standing - Balance Activities: Lateral lean/weight shifting;Forward lean/weight shifting;Reaching for objects Extremity Assessment  RLE Assessment RLE Assessment: Exceptions to [facility] General Strength Comments: grossly 3+/5 LLE Assessment LLE Assessment: Exceptions to [facility] General Strength Comments: grossly 3+/5   Geoffry Paradise, PT, DPT  01/18/2023, 7:59 AM

## 2023-01-18 NOTE — Progress Notes (Signed)
PROGRESS NOTE   Subjective/Complaints: Patient scheduled for discharge later today.  No new complaints or concerns noted.  He has outpatient dialysis later today.  ROS: Patient denies fever, chills, rash, sore throat, blurred vision, dizziness, nausea, constipation, vomiting, diarrhea, cough, shortness of breath or chest pain,  headache, or mood change.   + Back pain-improved after switching to air mattress    Objective:   No results found. Recent Labs    01/16/23 1144  WBC 8.4  HGB 11.9*  HCT 35.8*  PLT 192     Recent Labs    01/16/23 1144  NA 133*  K 4.2  CL 95*  CO2 21*  GLUCOSE 252*  BUN 56*  CREATININE 6.23*  CALCIUM 8.4*      Intake/Output Summary (Last 24 hours) at 01/18/2023 1506 Last data filed at 01/18/2023 0725 Gross per 24 hour  Intake 118 ml  Output --  Net 118 ml        Physical Exam: Vital Signs Blood pressure (!) 141/56, pulse (!) 50, temperature (!) 97.5 F (36.4 C), resp. rate 17, height 5\' 8"  (1.727 m), weight 83 kg, SpO2 98%. Body mass index is 27.82 kg/m.  Constitutional: No distress . Vital signs reviewed. HEENT: NCAT, conjugate gaze, MMM Neck: supple Cardiovascular: Bradycardic Respiratory/Chest: CTAB, normal effort, on room air GI/Abdomen: Nondistended, soft, positive bowel sounds Ext: no clubbing, cyanosis, or edema Psych: Pleasant skin: No evidence of breakdown, severe stasis dermatitis changes  TDC R chest wall  Neurologic: Alert and oriented x 3, follows commands moving all 4 extremities to gravity and resistance Musculoskeletal: No joint swelling  Assessment/Plan: 1. Functional deficits which require 3+ hours per day of interdisciplinary therapy in a comprehensive inpatient rehab setting. Physiatrist is providing close team supervision and 24 hour management of active medical problems listed below. Physiatrist and rehab team continue to assess barriers to  discharge/monitor patient progress toward functional and medical goals  Care Tool:  Bathing    Body parts bathed by patient: Right arm, Left arm, Chest, Abdomen, Face, Right upper leg, Front perineal area, Buttocks, Left upper leg   Body parts bathed by helper: Right lower leg, Left lower leg     Bathing assist Assist Level: Minimal Assistance - Patient > 75%     Upper Body Dressing/Undressing Upper body dressing   What is the patient wearing?: Pull over shirt    Upper body assist Assist Level: Supervision/Verbal cueing    Lower Body Dressing/Undressing Lower body dressing      What is the patient wearing?: Underwear/pull up, Pants     Lower body assist Assist for lower body dressing: Minimal Assistance - Patient > 75%     Toileting Toileting    Toileting assist Assist for toileting: Supervision/Verbal cueing     Transfers Chair/bed transfer  Transfers assist     Chair/bed transfer assist level: Minimal Assistance - Patient > 75%     Locomotion Ambulation   Ambulation assist   Ambulation activity did not occur: Safety/medical concerns (fatigue, pain)  Assist level: Moderate Assistance - Patient 50 - 74% Assistive device: No Device Max distance: 50 feet   Walk 10 feet activity   Assist  Walk 10 feet activity did not occur: Safety/medical concerns (fatigue, pain)  Assist level: Moderate Assistance - Patient - 50 - 74% Assistive device: No Device   Walk 50 feet activity   Assist Walk 50 feet with 2 turns activity did not occur: Safety/medical concerns  Assist level: Moderate Assistance - Patient - 50 - 74% Assistive device: No Device    Walk 150 feet activity   Assist Walk 150 feet activity did not occur: Safety/medical concerns         Walk 10 feet on uneven surface  activity   Assist Walk 10 feet on uneven surfaces activity did not occur: Safety/medical concerns (fatigue, pain)         Wheelchair     Assist Is the  patient using a wheelchair?: Yes Type of Wheelchair: Manual Wheelchair activity did not occur: Safety/medical concerns (pain, fatigue)  Wheelchair assist level: Minimal Assistance - Patient > 75% Max wheelchair distance: 50    Wheelchair 50 feet with 2 turns activity    Assist    Wheelchair 50 feet with 2 turns activity did not occur: Safety/medical concerns (pain, fatigue)   Assist Level: Minimal Assistance - Patient > 75%   Wheelchair 150 feet activity     Assist  Wheelchair 150 feet activity did not occur: Safety/medical concerns (pain, fatigue)   Assist Level: Maximal Assistance - Patient 25 - 49%   Blood pressure (!) 141/56, pulse (!) 50, temperature (!) 97.5 F (36.4 C), resp. rate 17, height 5\' 8"  (1.727 m), weight 83 kg, SpO2 98%.  Medical Problem List and Plan: 1. Functional deficits secondary to CHF             -patient may not  shower, HD cath             -ELOS/Goals: 18-21d, pt  goal is home by Birthday on 10/29  -Continue CIR therapies including PT, OT   -Est Discharge 01/18/23  -Okay for discharge home today   2.  Antithrombotics: -DVT/anticoagulation:  Pharmaceutical: Eliquis             -antiplatelet therapy:    3. Pain Management: Tylenol as needed  -Discussed with nursing air mattress-was told he will not qualify  -K-pad ordered   4. Mood/Behavior/Sleep: LCSW to evaluate and provide emotional support             -antipsychotic agents: n/a   5. Neuropsych/cognition: This patient is  capable of making decisions on his  own behalf.   6. Skin/Wound Care: Routine skin care checks, Eucerin cream for stasis dermatitis   7. Fluids/Electrolytes/Nutrition: Strict Is and Os and follow-up chemistries per nephrology             -renal diet with FR             -continue vitamin C, D3, Renavit   8: Hypertension: monitor TID and prn             -continue doxazosin 1 mg BID-continue to hold             -continue Lasix 80 mg daily             -continue  hydralazine 25 mg TID  -BP has been somewhat labile, continue to monitor trend.  Monitor for symptoms of orthostatic hypotension, particularly after hemodialysis.  -BP stable overall, order thigh high compression stockings to avoid orthostatic hypotension  -10/17 patient declined use of compression stockings yesterday, BP stable overall  -10/19 decrease hydralazine to 10  mg 3 times daily  -10/21 BP remains quite soft--hold hydralazine  -10/22 will discuss Lasix with nephrology, BP continues to be all soft   -10/23 Lasix continued after discussion with nephrology, monitor fluid status  10/24 nephrology decreased Imdur to 30 mg daily  10/27 BP controlled overall, continue current regimen    01/18/2023    5:40 AM 01/18/2023    5:00 AM 01/17/2023    7:29 PM  Vitals with BMI  Weight  183 lbs   BMI  27.83   Systolic 141  114  Diastolic 56  60  Pulse 50  63     9: Hyperlipidemia: continue statin, Lovaza   10: Right eye infection probable blepharitis: continue Maxitrol 2 wk treatment    11: GERD: continue PPI   12: 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 hypoglycemia  -10/19 patient and wife were very concerned about hypoglycemia this morning, will dose increase Semglee to 14 units, consider increase mealtime insulin  -10/22 decrease Semglee to 12 units  -10/23 decrease Semglee to 9 units today, decrease NovoLog to 2 units with meals to reduce risk of hypoglycemia  -10/24 will decrease Semglee to 6 units, had a hypoglycemic event this a.m. he did get lower dose of insulin today  -10/25 discussed with pharmacy, pharmacy would not recommend oral agent at this time due to his renal function, patient declining any insulin will discontinue scheduled dosing and continue sliding  scale insulin, monitor trend for now  -10/28 CBGs controlled off insulin, follow-up with PCP  CBG (last 3)  Recent Labs    01/17/23 1659 01/17/23 2111 01/18/23 0641  GLUCAP 85 135* 93       13: ESRD: HD on T/T/S via right internal jugular [facility]  -Reviewed nephrology note-[facility] dialysis Monday Wednesday Friday 1145   14: Chronic diastolic CHF: daily weight (dry weight ~220#)  EF is normal  -(meds as in #8) -fluid balance>>HD -10/21 weights fluctuate with HD  Filed Weights   01/16/23 1404 01/16/23 1808 01/18/23 0500  Weight: 88.1 kg 86.6 kg 83 kg      15: Atrial fibrillation: on Eliquis - bradycardia , not taking BBs monitor rate  -Avoid negative chronotropic medications  Heart rate stable continue to monitor   16: COPD/acute hypoxic respiratory failure due to volume overload:  -continue Breo Ellipta             -Wean supplemental O2 as able-stable on room air   17: CAD: on statin, meds as in #8   18: Obesity: BMI = 30.17  -Dietary education  -BMI a little improved to 27.49  Body mass index is 27.82 kg/m.    19: Anemia of chronic inflammation, cardiorenal disease ESRD: -continue Epogen 4000 units every TTS on hemodialysis days as per nephrology    -Hgb stable 11.9   20: Bilateral lower extremity lymphedema, chronic: -continue Lotrisone cream twice daily   21.  Fall.  -X-ray knees and hips without fracture or dislocation, arthritic changes noted.  Pain is improving in these areas  22.  GERD: Pantoprazole increased to twice daily 40 mg.  Simethicone started for gas.  Continue these.   23. Bradycardia: medications reviewed and he is not on any rate control agents.   24. Constipation: last BM reviewed and was 10/25, messaged nursing to ensure accuracy. Continue colace 100mg  BID  -Last BM yesterday  LOS: 14 days A FACE TO FACE EVALUATION WAS PERFORMED  Fanny Dance 01/18/2023, 3:06 PM

## 2023-01-18 NOTE — Progress Notes (Signed)
Inpatient Rehabilitation Discharge Medication Review by a Pharmacist  A complete drug regimen review was completed for this patient to identify any potential clinically significant medication issues.   High Risk Drug Classes Is patient taking? Indication by Medication  Antipsychotic No    Anticoagulant Yes Apixaban- PAF  Antibiotic No    Opioid No    Antiplatelet No    Hypoglycemics/insulin No   Vasoactive Medication Yes Lasix, apresoline, imdur- HTN  Chemotherapy No    Other Yes Protonix- GERD Crestor- HLD Lovaza- hypertriglyceridemia Hydroxyzine- itching Nitroglycerin spray - PRN chest pain Renvela- phosphate binder Lotrisone cream- PRN itching        Type of Medication Issue Identified Description of Issue Recommendation(s)  Drug Interaction(s) (clinically significant)        Duplicate Therapy        Allergy        No Medication Administration End Date        Incorrect Dose        Additional Drug Therapy Needed        Significant med changes from prior encounter (inform family/care partners about these prior to discharge).      Other            Clinically significant medication issues were identified that warrant physician communication and completion of prescribed/recommended actions by midnight of the next day:  No   Pharmacist comments:   Time spent performing this drug regimen review (minutes):  20 minutes   Alinda Money L Aryah Doering 01/18/2023 12:12 PM

## 2023-01-18 NOTE — Progress Notes (Signed)
Inpatient Rehabilitation Care Coordinator Discharge Note   Patient Details  Name: Kyle Roberson MRN: [id number] Date of Birth: 1940/06/15   Discharge location: HOME WITH WIFE WHO CAN PROVIDE 24/7 CARE  Length of Stay: 14 DAYS  Discharge activity level: SUPERVISION-CGA LEVEL  Home/community participation: ACTIVE  Patient response WU:[id number] Literacy - How often do you need to have someone help you when you read instructions, pamphlets, or other written material from your doctor or pharmacy?: Never  Patient response YN:WGNFAO Isolation - How often do you feel lonely or isolated from those around you?: Never  Services provided included: MD, RD, PT, OT, RN, CM, Pharmacy, Neuropsych, SW  Financial Services:  Financial Services Utilized: Medicare    Choices offered to/list presented to: Kyle Roberson-WIFE [phone]  Follow-up services arranged:  Outpatient, Patient/Family has no preference for HH/DME agencies    Outpatient Servicies: [facility]-WILL CALL WIFE TO SET UP FOLLOW UP APPOINTMENT    OP-HD SET UP VIA [facility] WIFE AWARE AND WILL TRANSPORT TO FACILITY  Patient response to transportation need: Is the patient able to respond to transportation needs?: Yes In the past 12 months, has lack of transportation kept you from medical appointments or from getting medications?: No In the past 12 months, has lack of transportation kept you from meetings, work, or from getting things needed for daily living?: No   Patient/Family verbalized understanding of follow-up arrangements:  Yes  Individual responsible for coordination of the follow-up plan: Kyle Roberson [phone]  Confirmed correct DME delivered: Kyle Roberson 01/18/2023    Comments (or additional information):Kyle Roberson WAS IN FOR HANDS ON EDUCATION AND ACTUAL CAR TRANSFER. FELT COMFORTABLE WITH CARE  Summary of Stay    Date/Time Discharge Planning CSW  01/13/23 2440 Wife will be here tomorrow to do hands on  education-aware of husband's need for 24/7 care. Pt tends to do what he wants to do. Awaiting team;s recommendations for DME and HH vs OP RGD  01/06/23 0825 Home with wife who works outside of the home, will need to see if plans to hire assist while she is gone. Pt feels he can manage at home RGD       Kyle Roberson, Kyle Roberson

## 2023-01-18 NOTE — Discharge Summary (Signed)
Physician Discharge Summary  Patient ID: Kyle Roberson MRN: [id number] DOB/AGE: [birth year] 82 y.o.  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 FINDINGS: Tricompartmental degenerative changes are noted worst in the medial joint space. No acute fracture or dislocation is seen. No joint effusion is noted. IMPRESSION: No acute abnormality noted. Electronically Signed   By: Alcide Clever M.D.   On: 01/11/2023 21:51   DG HIP UNILAT WITH PELVIS 2-3 VIEWS LEFT  Result Date: 01/11/2023 CLINICAL DATA:  Recent fall in physical therapy with left hip pain, initial encounter EXAM: DG HIP (WITH OR WITHOUT PELVIS) 3V LEFT COMPARISON:  None Available. FINDINGS: Mild degenerative changes of left hip are noted. Vascular calcifications are seen. Pelvic ring is intact. No acute soft tissue abnormality is noted. IMPRESSION: No acute abnormality noted. Electronically Signed   By: Alcide Clever M.D.   On: 01/11/2023 21:51   DG HIP UNILAT WITH PELVIS 2-3 VIEWS RIGHT  Result Date: 01/11/2023 CLINICAL DATA:  Recent fall with right hip pain, initial encounter EXAM: DG HIP (WITH OR WITHOUT PELVIS) 2V RIGHT COMPARISON:  None Available. FINDINGS: No  acute fracture or dislocation is noted. Mild degenerative changes are seen. Vascular calcifications are noted. IMPRESSION: No acute bony abnormality noted. Electronically Signed   By: Alcide Clever M.D.   On: 01/11/2023 21:50   DG Knee 1-2 Views Left  Result Date: 01/11/2023 CLINICAL DATA:  Recent fall in physical therapy with knee pain, initial encounter EXAM: LEFT KNEE - 2 VIEW COMPARISON:  None Available. FINDINGS: Tricompartmental degenerative changes are noted. No acute fracture or dislocation is seen. No joint effusion is noted. No soft tissue changes are noted. IMPRESSION: Degenerative change without acute abnormality Electronically Signed   By: Alcide Clever M.D.   On: 01/11/2023 21:49   Labs:  Basic Metabolic Panel: Recent Labs  Lab 01/14/23 1452 01/16/23 1144  NA 134* 133*  K 4.0 4.2  CL 94* 95*  CO2 22 21*  GLUCOSE 162* 252*  BUN 75* 56*  CREATININE 7.58* 6.23*  CALCIUM 8.8* 8.4*  PHOS 5.1* 4.2    CBC: Recent Labs  Lab 01/14/23 1452 01/16/23 1144  WBC 9.0 8.4  HGB 11.5* 11.9*  HCT 35.6* 35.8*  MCV 90.4 90.4  PLT 218 192    CBG: Recent Labs  Lab 01/17/23 0631 01/17/23 1152 01/17/23 1659 01/17/23 2111 01/18/23 0641  GLUCAP 106* 178* 85 135* 93    Brief HPI:   Kyle Roberson is a 82 y.o. male who presented to the emergency department on 11/26/2022 to been evaluated by his nephrologist for volume overload. He was admitted to the hospitalist service for acute on chronic diastolic heart failure started on Lasix. 2D echo was performed on 9/06 that revealed a left ventricular ejection fraction of 55 to 60% with normal left ventricle function and normal right ventricle function. He had severely elevated pulmonary arterial systolic pressure. He was seen in consultation  that day by Dr. Elwyn Lade with failure team. 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 He is maintained on Eliquis 2.5 mg twice daily dosed appropriate for age and renal function. Nephrology was consulted on 9/07. Patient's cardiologist Dr. Mariah Milling was also consulted. His urine output significantly dropped on 9/11 and BUN and creatinine continued upward trend. He remained in rate controlled atrial fibrillation. Rate averaging 40 to 50 bpm with some overnight bradycardia 30s. He developed cardiorenal syndrome and cardiogenic shock. Converted to heparin infusion. He underwent placement of dialysis catheter initiation of CRRT. Palliative care consultation obtained. 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 Ultimately transition to intermittent hemodialysis and is now on a Tuesday, Thursday, Saturday schedule. Eliquis was resumed on 9/24. He underwent placement of tunneled hemodialysis catheter via the right IJ by Dr. Wyn Quaker on 9/25. Per cardiology, no role for PPM currently in light of need for HD and additional nidus for infection. Patient ambulated 5ft with Mod A initially, progressing to Min A with increased ambulation distance using rolling walker.    Hospital Course: Kado Carithers was admitted to rehab 01/04/2023 for inpatient therapies to consist of PT, ST and OT at least three hours five days a week. Past admission physiatrist, therapy team and rehab RN have worked together to provide customized collaborative inpatient rehab.  Nephrology consultation and follow-up continued.  Decreased Semglee to 18 units to avoid morning hypoglycemia.  Thigh-high TED hose ordered to avoid orthostatic hypotension, however patient refused to wear them.  Lotrisone cream for lower extremity dermatitis.  Decrease Semglee to 16 units on 10/17.  Right eye infection probable  blepharitis, continue Maxitrol for 2-week treatmentReported fall to knees with therapy on 10/21.  X-rays of bilateral knees and hips obtained.  No acute fracture or process. Further reductions and discontinuance of Semglee as outlined below.  Blood pressures were monitored on TID basis and doxazosin 1 mg BID, Lasix 80 mg daily and hydralazine 25 mg TID continued.  Hydralazine decreased to 10 mg 3 times daily on 10/19.  Hydralazine held due to soft blood pressure readings on 10/21.  Lasix discontinued after discussion with nephrology on 10/23.  Nephrology decreased Imdur to 30 mg daily on 10/24.  Diabetes has been monitored with ac/hs CBG checks and SSI was use prn for tighter BS control. Novolog 3 units with meals.  Decrease Semglee to 18 units, decreased again on 10/17 to 16 units.  Decreased further to 14 units on 10/19.  Decreased Semglee to 12 units on 10/22.  Semglee decreased to 6 units 10/24. -10/25 discussed with pharmacy, pharmacy would not recommend oral agent at this time due to his renal function, patient declining any insulin will discontinue scheduled dosing and continue sliding scale insulin.  Not requiring coverage using sliding scale insulin, therefore discontinued at discharge.  Discussed with patient and his wife regarding meal intake and continuing to monitor fingersticks 4 times daily.  Record this and bring these readings to primary care follow-up.    Rehab course: During patient's stay in rehab weekly team conferences were held to monitor patient's progress, set goals and discuss barriers to discharge. At admission, patient required mod assist with mobility and max assist with basic self-care skills and IADL.  He has had improvement  in activity tolerance, balance, postural control as well as ability to compensate for deficits. He has had improvement in functional use RUE/LUE  and RLE/LLE as well as improvement in awareness. Patient has met 11 of 11 long term goals due to improved  activity tolerance, improved balance, ability to compensate for deficits, and functional use of  RIGHT lower and LEFT lower extremity.  Patient to discharge at overall Supervision level.  Patient's care partner is independent to provide the necessary physical and cognitive assistance at discharge.    Discharge disposition: 01-Home or Self Care     Diet: renal; 1200 cc fluid restriction  Special Instructions: No driving, alcohol consumption or tobacco use.  30-35 minutes were spent on discharge planning and discharge summary.  Discharge Instructions     Discharge patient   Complete by: As directed    Discharge disposition: 01-Home or Self Care   Discharge patient date: 01/18/2023      Allergies as of 01/18/2023       Reactions   Atorvastatin Hives, Itching, Other (See Comments)   Other reaction(s): Other (See Comments)   Simvastatin Hives, Itching, Other (See Comments)   Other reaction(s): Other (See Comments)        Medication List     STOP taking these medications    bisacodyl 10 MG suppository Commonly known as: DULCOLAX   calcium carbonate 500 MG chewable tablet Commonly known as: TUMS - dosed in mg elemental calcium   docusate sodium 100 MG capsule Commonly known as: COLACE   doxazosin 1 MG tablet Commonly known as: CARDURA   fluticasone furoate-vilanterol 200-25 MCG/ACT Aepb Commonly known as: BREO ELLIPTA   furosemide 80 MG tablet Commonly known as: LASIX   glipiZIDE 5 MG 24 hr tablet Commonly known as: GLUCOTROL XL   hydrALAZINE 25 MG tablet Commonly known as: APRESOLINE   hydrOXYzine 25 MG tablet Commonly known as: ATARAX   ipratropium-albuterol 0.5-2.5 (3) MG/3ML Soln Commonly known as: DUONEB   multivitamin with minerals Tabs tablet   ondansetron 4 MG tablet Commonly known as: ZOFRAN   oxyCODONE 5 MG immediate release tablet Commonly known as: Oxy IR/ROXICODONE   polyethylene glycol 17 g packet Commonly known as: MIRALAX /  GLYCOLAX   traZODone 50 MG tablet Commonly known as: DESYREL       TAKE these medications    acetaminophen 325 MG tablet Commonly known as: TYLENOL Take 1-2 tablets (325-650 mg total) by mouth every 4 (four) hours as needed for mild pain (pain score 1-3). What changed:  how much to take when to take this reasons to take this   apixaban 2.5 MG Tabs tablet Commonly known as: Eliquis Take 1 tablet (2.5 mg total) by mouth 2 (two) times daily.   clotrimazole-betamethasone cream Commonly known as: LOTRISONE Apply topically 2 (two) times daily.   FISH OIL PO Take 1 capsule by mouth in the morning.   hydrocerin Crea Apply 1 Application topically 3 (three) times daily.   isosorbide mononitrate 30 MG 24 hr tablet Commonly known as: IMDUR Take 1 tablet (30 mg total) by mouth daily. Start taking on: January 19, 2023 What changed:  medication strength how much to take   multivitamin Tabs tablet Take 1 tablet by mouth at bedtime.   neomycin-polymyxin b-dexamethasone 3.5-10000-0.1 Oint Commonly known as: MAXITROL Place 1 Application into the right eye 3 (three) times daily.   nitroGLYCERIN 0.4 MG/SPRAY spray Commonly known as: NITROLINGUAL Place 1 spray under the tongue as directed.   pantoprazole 40  MG tablet Commonly known as: PROTONIX Take 1 tablet (40 mg total) by mouth 2 (two) times daily. What changed: when to take this   rosuvastatin 10 MG tablet Commonly known as: CRESTOR Take 1 tablet (10 mg total) by mouth 3 (three) times a week.   sevelamer carbonate 800 MG tablet Commonly known as: RENVELA Take 2 tablets (1,600 mg total) by mouth 3 (three) times daily with meals.   VITAMIN C PO Take 1 tablet by mouth in the morning.   Vitamin D3 1.25 MG (50000 UT) Caps Take 1 tablet by mouth in the morning.        Follow-up Information     Dialysis, [facility]. Go on 01/18/2023.   Why: Schedule is Monday, Wednesday, Friday.  Arrive at 11:30 am for 11:45 am  chair time. Contact information: [address] [place] [address] [phone]         [name], MD Follow up.   Specialty: Internal Medicine Why: Call the office in 1-2 days to make arrangements for hosptial follow-up appointment. Contact information: [address] [place] [address] [phone]         Fanny Dance, MD Follow up.   Specialty: Physical Medicine and Rehabilitation Why: As needed Contact information: [address] [place] [address] [phone]         Leafy Ro, MD Follow up.   Specialty: General Surgery Why: Call the office in 1-2 days to make arrangements for hosptial follow-up appointment. Contact information: [address] [place] [address] [phone]         Antonieta Iba, MD Follow up.   Specialty: Cardiology Why: Call the office in 1-2 days to make arrangements for hosptial follow-up appointment. Contact information: [address] [place] [address] [phone]         Bensimhon, Bevelyn Buckles, MD. Schedule an appointment as soon as possible for a visit.   Specialty: Cardiology Contact information: [address] [place] [address] [phone]                 Signed: Milinda Antis 01/18/2023, 10:19 AM

## 2023-01-19 NOTE — Progress Notes (Signed)
Late Note Entry- January 19, 2023  D/C summary and last renal note faxed to [facility] this morning for continuation of care.    Olivia Canter Renal Navigator [phone]

## 2023-01-25 ENCOUNTER — Encounter: Payer: Self-pay | Admitting: Internal Medicine

## 2023-02-23 ENCOUNTER — Encounter: Payer: Medicare HMO | Admitting: Internal Medicine

## 2023-03-10 ENCOUNTER — Telehealth: Payer: Self-pay

## 2023-03-10 DIAGNOSIS — I5032 Chronic diastolic (congestive) heart failure: Secondary | ICD-10-CM

## 2023-03-10 NOTE — Telephone Encounter (Signed)
Patient's wife called asking for an appt with Dr. Gala Romney to discuss pt's cardiac status and assess if pt's could be appropriate risk for anesthesia. Pt's wife states pt "is doing much better than a few months ago" and they would like to have surgery for abdominal line placement for home peritoneal dialysis.   Pt scheduled for 1/17 with Dr. Gala Romney. Pt wife asks if they can have an echo done the day of appointment because they do not want to delay surgery for PD if the patient can be cleared from a cardiac standpoint.   Will route to Dr Gala Romney for possible orders for echocardiogram.

## 2023-03-15 ENCOUNTER — Telehealth: Payer: Self-pay

## 2023-03-15 DIAGNOSIS — I5032 Chronic diastolic (congestive) heart failure: Secondary | ICD-10-CM

## 2023-03-15 NOTE — Telephone Encounter (Signed)
Patient scheduled for echocardiogram prior to next office visit per Dr. Gala Romney. Patient scheduled at [facility] to accommodate patient's scheduling needs.  Pt's wife given instructions and informed to enter [facility]. Reviewed parking, and the need to arrive at least 15 minutes prior to appointment time. Pt's wife verbalized understanding.  Echo order placed.

## 2023-03-22 ENCOUNTER — Telehealth: Payer: Medicare HMO | Admitting: Neurosurgery

## 2023-03-22 ENCOUNTER — Telehealth: Payer: Self-pay | Admitting: Neurosurgery

## 2023-03-22 NOTE — Telephone Encounter (Signed)
Spoke with patient's wife about patients condition, originally we wanted to get his PCP to order a CT Head prior to seeing patient in the clinic. However, after discussing more with Doctor Katrinka Blazing, he was able to offer a telephone call for 12/31 at 2pm. Patients accepted this appointment since today at 3:45pm they would not be able to call since the patient is in dialysis.

## 2023-03-22 NOTE — Telephone Encounter (Signed)
Patient's wife, [name] is calling to request an appointment with our office. She states the patient had a craniotomy with Dr. Adriana Simas in November 2022 and followed up with Duwayne Heck at [facility]. She states the patient fell on 03/14/2023 in which he hit his head, but states the patient never developed a knot or anything. Since the fall, the patient has had an indentation that has gradually gotten worse and is about an inch deep on the back crown of his head. He states that when he turns his head both directions his neck is popping. They would like to follow up with our office rather than [facility]. Please advise when patient should be seen.

## 2023-03-23 ENCOUNTER — Ambulatory Visit
Admission: RE | Admit: 2023-03-23 | Discharge: 2023-03-23 | Disposition: A | Discharge status: Home / Self Care | Payer: Self-pay | Source: Ambulatory Visit | Attending: Neurosurgery | Admitting: Neurosurgery

## 2023-03-23 ENCOUNTER — Ambulatory Visit: Payer: Medicare HMO | Admitting: Neurosurgery

## 2023-03-23 ENCOUNTER — Other Ambulatory Visit: Payer: Self-pay

## 2023-03-23 DIAGNOSIS — Z049 Encounter for examination and observation for unspecified reason: Secondary | ICD-10-CM

## 2023-03-23 DIAGNOSIS — R519 Headache, unspecified: Secondary | ICD-10-CM | POA: Diagnosis not present

## 2023-03-23 DIAGNOSIS — W19XXXA Unspecified fall, initial encounter: Secondary | ICD-10-CM | POA: Diagnosis not present

## 2023-03-23 DIAGNOSIS — Z8679 Personal history of other diseases of the circulatory system: Secondary | ICD-10-CM

## 2023-03-26 ENCOUNTER — Telehealth: Payer: Self-pay | Admitting: Neurosurgery

## 2023-03-26 DIAGNOSIS — W19XXXA Unspecified fall, initial encounter: Secondary | ICD-10-CM

## 2023-03-26 DIAGNOSIS — Z87828 Personal history of other (healed) physical injury and trauma: Secondary | ICD-10-CM

## 2023-03-26 NOTE — Telephone Encounter (Signed)
 CT has been ordered. Kyle Roberson will work on the authorization. Once it is approved, we will send a message to radiology scheduling to contact them to schedule. In the meantime, if he develops new/worsening symptoms (such as headache, vomiting, vision changes, changes in speech, changes in level of consciousness/confusion), he needs to go to the ER.

## 2023-03-26 NOTE — Telephone Encounter (Signed)
 Attempted to reach [name] x 2. Phone goes straight to voicemail and voicemail is not set up yet.

## 2023-03-26 NOTE — Telephone Encounter (Signed)
 CT has been ordered with diagnosis of fall and history of subdural hemorrhage per discussion with Joan Flores, PA-C

## 2023-03-26 NOTE — Telephone Encounter (Signed)
 [name] pt's wife is calling. They spoke to Dr.Smith on 03/23/2023 and he was supposed to have ordered a STAT CT of the head. Patient fell and was in the ER on 03/14/2023. He has an indentation on the back of his skull. Dr.Smith told her if she did not get a call from imaging department to call the office back. Can someone in the office order the CT please she wants to avoid the ER. [name] said that [facility] is familiar with them can she order the CT because Dr.Smith is out of the office.

## 2023-03-26 NOTE — Telephone Encounter (Signed)
 I spoke to [facility], she is aware of CT pending auth and all the symptoms she should be looking out for to take him to the ER if needed. She has not further questions.

## 2023-03-29 NOTE — Progress Notes (Signed)
 I had a phone visit today with the patient and his spouse.  They were at home and I was in the office.  They gave consent to go forward with discussion of his recent fall.  He has a history of a cranial surgery as well as subdural hematoma evacuation via bur hole.  He states that he fell and hit the back of his head.  He has had worsened headaches.  He feels like he may have a depression in his skull but no evidence of any skin breakage.  He is not had any new weakness numbness or tingling.  He does have a headache however.  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  Given the fall and the perceived deformity in his skull.  Would like to evaluate with a CT scan of the brain.  Will follow-up with him after the CT scan to let him know whether or not there are any changes.  Thankfully without broken skin most skull fractures do not need surgical treatment.  Will follow-up after the images are obtained.  Spent a total of 10 minutes on the phone discussing the plan.

## 2023-04-06 ENCOUNTER — Ambulatory Visit (HOSPITAL_COMMUNITY)
Admission: RE | Admit: 2023-04-06 | Discharge: 2023-04-06 | Disposition: A | Discharge status: Home / Self Care | Payer: Medicare PPO | Source: Ambulatory Visit | Attending: Internal Medicine | Admitting: Internal Medicine

## 2023-04-06 DIAGNOSIS — E119 Type 2 diabetes mellitus without complications: Secondary | ICD-10-CM | POA: Insufficient documentation

## 2023-04-06 DIAGNOSIS — I272 Pulmonary hypertension, unspecified: Secondary | ICD-10-CM | POA: Diagnosis not present

## 2023-04-06 DIAGNOSIS — I083 Combined rheumatic disorders of mitral, aortic and tricuspid valves: Secondary | ICD-10-CM | POA: Insufficient documentation

## 2023-04-06 DIAGNOSIS — I110 Hypertensive heart disease with heart failure: Secondary | ICD-10-CM | POA: Insufficient documentation

## 2023-04-06 DIAGNOSIS — I5032 Chronic diastolic (congestive) heart failure: Secondary | ICD-10-CM | POA: Diagnosis present

## 2023-04-06 LAB — ECHOCARDIOGRAM COMPLETE
Area-P 1/2: 4.8 cm2
Calc EF: 71.4 %
P 1/2 time: 780 ms
S' Lateral: 2.9 cm
Single Plane A2C EF: 75.4 %
Single Plane A4C EF: 67.3 %

## 2023-04-08 ENCOUNTER — Ambulatory Visit
Admission: RE | Admit: 2023-04-08 | Discharge: 2023-04-08 | Disposition: A | Discharge status: Home / Self Care | Payer: Medicare PPO | Source: Ambulatory Visit | Attending: Physician Assistant | Admitting: Physician Assistant

## 2023-04-08 DIAGNOSIS — M898X8 Other specified disorders of bone, other site: Secondary | ICD-10-CM | POA: Insufficient documentation

## 2023-04-08 DIAGNOSIS — W19XXXA Unspecified fall, initial encounter: Secondary | ICD-10-CM | POA: Diagnosis not present

## 2023-04-08 DIAGNOSIS — Z87828 Personal history of other (healed) physical injury and trauma: Secondary | ICD-10-CM | POA: Insufficient documentation

## 2023-04-08 DIAGNOSIS — Z043 Encounter for examination and observation following other accident: Secondary | ICD-10-CM | POA: Diagnosis not present

## 2023-04-09 ENCOUNTER — Encounter: Payer: Medicare HMO | Admitting: Internal Medicine

## 2023-04-29 ENCOUNTER — Ambulatory Visit (HOSPITAL_COMMUNITY)
Admission: RE | Admit: 2023-04-29 | Discharge: 2023-04-29 | Disposition: A | Discharge status: Home / Self Care | Payer: 59 | Source: Ambulatory Visit | Attending: Internal Medicine | Admitting: Internal Medicine

## 2023-04-29 VITALS — BP 124/70 | HR 48 | Wt 217.0 lb

## 2023-04-29 DIAGNOSIS — I482 Chronic atrial fibrillation, unspecified: Secondary | ICD-10-CM

## 2023-04-29 DIAGNOSIS — Z01810 Encounter for preprocedural cardiovascular examination: Secondary | ICD-10-CM | POA: Insufficient documentation

## 2023-04-29 DIAGNOSIS — I2510 Atherosclerotic heart disease of native coronary artery without angina pectoris: Secondary | ICD-10-CM | POA: Insufficient documentation

## 2023-04-29 DIAGNOSIS — N186 End stage renal disease: Secondary | ICD-10-CM | POA: Diagnosis not present

## 2023-04-29 DIAGNOSIS — I5032 Chronic diastolic (congestive) heart failure: Secondary | ICD-10-CM | POA: Insufficient documentation

## 2023-04-29 DIAGNOSIS — E1122 Type 2 diabetes mellitus with diabetic chronic kidney disease: Secondary | ICD-10-CM | POA: Insufficient documentation

## 2023-04-29 DIAGNOSIS — I132 Hypertensive heart and chronic kidney disease with heart failure and with stage 5 chronic kidney disease, or end stage renal disease: Secondary | ICD-10-CM | POA: Insufficient documentation

## 2023-04-29 DIAGNOSIS — I4821 Permanent atrial fibrillation: Secondary | ICD-10-CM | POA: Diagnosis not present

## 2023-04-29 DIAGNOSIS — Z7901 Long term (current) use of anticoagulants: Secondary | ICD-10-CM | POA: Diagnosis not present

## 2023-04-29 DIAGNOSIS — Z955 Presence of coronary angioplasty implant and graft: Secondary | ICD-10-CM | POA: Insufficient documentation

## 2023-04-29 DIAGNOSIS — E785 Hyperlipidemia, unspecified: Secondary | ICD-10-CM | POA: Insufficient documentation

## 2023-04-29 DIAGNOSIS — E1169 Type 2 diabetes mellitus with other specified complication: Secondary | ICD-10-CM | POA: Diagnosis not present

## 2023-04-29 DIAGNOSIS — Z79899 Other long term (current) drug therapy: Secondary | ICD-10-CM | POA: Insufficient documentation

## 2023-04-29 MED ORDER — NITROGLYCERIN 0.4 MG/SPRAY TL SOLN: 1.0000 | 1 refills | Status: DC

## 2023-04-29 MED ORDER — ROSUVASTATIN CALCIUM 10 MG PO TABS: 10.0000 mg | ORAL_TABLET | ORAL | 3 refills | Status: DC

## 2023-04-29 NOTE — Patient Instructions (Signed)
 Great to see you today!!!  Your physician has requested that you have a lexiscan myoview. Please follow instructions below:  How to Prepare for Your Myoview Test (stress test):  Your medications may be taken with water . Nothing to eat or drink, except water , 4 hours prior to arrival time.  NO caffeine /decaffeinated products, or chocolate 12 hours prior to arrival. [place], please do not wear dresses.  Skirts or pants are approprate, please wear a short sleeve shirt. NO perfume, cologne or lotion Wear comfortable walking shoes.  NO HEELS! Total time is 3 to 4 hours; you may want to bring reading material for the waiting time. Please report to [facility] for your test  What to expect after you arrive:  Once you arrive and check in for your appointment an IV will be started in your arm.  Then the Technoligist will inject a small amount of radioactive tracer.  There will be a 1 hour waiting period after this injection.  A series of pictures will be taken of your heart following this waiting period.  You will be prepped for the stress portion of the test.  During the stress portion of your test you will either walk on a treadmill or receive a small, safe amount of radioactive tracer injected in your IV.  After the stress portion, there is a short rest period during which time your heart and blood pressure will be monitored.  After the short rest period the Technologist will begin your second set of pictures.  Your doctor will inform you of your test results within 7-10 business days.  In preparation for your appointment, medication and supplies will be purchased.  Appointment availability is limited, so if you need to cancel or reschedule please call the office at [phone] 24 hours in advance to avoid a cancellation fee of $100.00  IF YOU THINK YOU MAY BE PREGNANT, PLEASE INFORM THE TECHNOLOGIST.   Your physician recommends that you schedule a follow-up appointment in: 6 months (Aug) at our  [facility] location, we will call you closer to this time to schedule

## 2023-04-29 NOTE — Progress Notes (Signed)
 [facility]ED [facility] CLINIC NOTE  ERE:Azmholwi, Leita DEL, MD Primary Cardiologist: Evalene Lunger, MD   HPI:  Kyle Roberson is an 83 y.o. 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   Presented to ED for for evaluation on 11/19. BNP 263, Scr 2.69 (baseline Scr had been variable, ranging 2-2.5), . HS troponin 22>21. CXR with evidence of   Echo 11/23 EF 55-60% RV moderately dilated/moderate HK. Mild AS RVSP . Has never had RHC   Was admitted in 11/23 with massive volume overloaded. Diuresed 50 pounds. D/c weight 220.   Admitted in 9/24 and progressed to ESRD. Discharged to SNF  Echo 1/25 EF 65-70% RV ok  Here with his wife Towana). Says he has improved with HD. Fluid well controlled. Able to get around the house without too much problem. Tolerating HD well. No CP. Here to discuss peri-operative risk for placing PD catheter. Still making some urine so taking lasix  80mg  daily on off days.   ROS: All systems negative except as listed in HPI, PMH and Problem List.  SH:  Social History   Socioeconomic History   Marital status: Married    Spouse name: Not on file   Number of children: Not on file   Years of education: Not on file   Highest education level: Not on file  Occupational History   Not on file  Tobacco Use   Smoking status: Former   Smokeless tobacco: Never  Vaping Use   Vaping status: Never Used  Substance and Sexual Activity   Alcohol  use: Not Currently    Alcohol /week: 2.0 standard drinks of alcohol     Types: 2 Glasses of wine per week   Drug use: Not Currently   Sexual activity: Not on file  Other Topics Concern   Not on file  Social History Narrative   Lives at home with wife   Social Drivers of Corporate Investment Banker Strain: Not on file  Food Insecurity: No Food Insecurity (12/01/2022)   Hunger Vital Sign    Worried About Running Out of  Food in the Last Year: Never true    Ran Out of Food in the Last Year: Never true  Transportation Needs: No Transportation Needs (12/01/2022)   PRAPARE - Administrator, Civil Service (Medical): No    Lack of Transportation (Non-Medical): No  Physical Activity: Not on file  Stress: Not on file  Social Connections: Not on file  Intimate Partner Violence: Not At Risk (12/01/2022)   Humiliation, Afraid, Rape, and Kick questionnaire    Fear of Current or Ex-Partner: No    Emotionally Abused: No    Physically Abused: No    Sexually Abused: No    FH:  Family History  Problem Relation Age of Onset   Pancreatic cancer Mother    CAD Father    Diabetes Brother     Past Medical History:  Diagnosis Date   AKI (acute kidney injury) (HCC) 07/06/2018   CAD (coronary artery disease)    a. Remote PCI/stenting to LAD w PTCA Diagnoal. RCA 60%; b.  2005/2008 Cardiolites w/ reportedly mild ischemia in Diag territory-->Med rx.   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 04/2019 Echo: EF 55-60%, no rwma, nl RV fxn, RVSP 55.60mmHg. Mod dil LA. Mild-mod MR. Mod dil PA.   CKD (  chronic kidney disease), stage IV (HCC)    Diabetes (HCC)    GERD (gastroesophageal reflux disease)    History of MI (myocardial infarction) 06/27/2014   HLD (hyperlipidemia)    HTN (hypertension)    PAH (pulmonary artery hypertension) (HCC)    a. 04/2019 Echo: RVSP 55.41mmHg.   Permanent atrial fibrillation (HCC)    a. CHA2DS2VASc = 5-->dose adjusted eliquis  (age/creat).   Subdural hematoma (HCC)    a. 01/2021 in setting of fall s/p L frontotemporal craniotomy.    Current Outpatient Medications  Medication Sig Dispense Refill   acetaminophen  (TYLENOL ) 325 MG tablet Take 1-2 tablets (325-650 mg total) by mouth every 4 (four) hours as needed for mild pain (pain score 1-3).     apixaban  (ELIQUIS ) 2.5 MG TABS tablet Take 1 tablet (2.5 mg  total) by mouth 2 (two) times daily. 180 tablet 1   Ascorbic Acid  (VITAMIN C  PO) Take 1 tablet by mouth in the morning.     Calcium  Acetate 667 MG TABS Take 1 tablet by mouth in the morning, at noon, and at bedtime.     Cholecalciferol  (VITAMIN D3) 1.25 MG (50000 UT) CAPS Take 1 tablet by mouth in the morning.     hydrocerin (EUCERIN) CREA Apply 1 Application topically 3 (three) times daily.     multivitamin (RENA-VIT) TABS tablet Take 1 tablet by mouth at bedtime. 30 tablet 0   neomycin -polymyxin b-dexamethasone  (MAXITROL ) 3.5-10000-0.1 OINT Place 1 Application into the right eye 3 (three) times daily. 3.5 g 1   nitroGLYCERIN  (NITROLINGUAL ) 0.4 MG/SPRAY spray Place 1 spray under the tongue as directed. 12 g 0   Omega-3 Fatty Acids (FISH OIL PO) Take 1 capsule by mouth in the morning.     rosuvastatin  (CRESTOR ) 10 MG tablet Take 1 tablet (10 mg total) by mouth 3 (three) times a week. 45 tablet 3   No current facility-administered medications for this encounter.    Vitals:   04/29/23 0949  BP: 124/70  Pulse: (!) 48  SpO2: 98%  Weight: 98.4 kg (217 lb)    Wt Readings from Last 3 Encounters:  04/29/23 98.4 kg (217 lb)  01/18/23 83 kg (182 lb 15.7 oz)  01/02/23 84.7 kg (186 lb 11.7 oz)     PHYSICAL EXAM:  General: Elderly male in WC  No resp difficulty HEENT: normal Neck: supple. no JVD. Carotids 2+ bilat; no bruits. No lymphadenopathy or thryomegaly appreciated. Cor: Irregular rate & rhythm. No rubs, gallops or murmurs. + HD Cath Lungs: clear Abdomen: obese soft, nontender, nondistended. No hepatosplenomegaly. No bruits or masses. Good bowel sounds. Extremities: no cyanosis, clubbing, rash, edema severe stasis dermatitis  Neuro: alert & orientedx3, cranial nerves grossly intact. moves all 4 extremities w/o difficulty. Affect pleasant  ASSESSMENT & PLAN:  1. Pre-operative CV risk - given normal EF and improved functional status suspect moderate risk for PD catheter placement  -  discussed pros and cons - Given remote CAD will do Lexiscan myoview   2.Chronic diastolic CHF with prominent R-sided HF:  Echo 02/21: EF 55-60%, RV okay, RVSP 56 mmHg, dilated pulmonary artery, dilated IVC -Echo 11/23: EF 55-60%, interventricular septum flattened in systole consistent with RV pressure overload, RV function okay, RVSP severely elevated 83 mmHg, mild MR, moderate TR, dilated pulmonary artery, dilated IVC - Echo 1/25 EF 65-70% RV ok - Improved II-III - Meds limited by ESRD   3. 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 Improved with fluid removal   4. CAD: - Hx remote stenting to LAD and diagonal - No s/s angina -  Continue statin - No ASA on AC   5. Permanent atrial fibrillation: - Rate stable - On Eliquis  2.5 mg BID (appropriate dose for age and renal function) - No bleeding   6. ESRD - see discussion above  7. DM2 - followed by PCP.  - Check HgBA1c 7.3 on 06/22/22  Toribio Fuel, MD  10:20 AM

## 2023-04-29 NOTE — Addendum Note (Signed)
 Encounter addended by: Glorietta Lark, RN on: 04/29/2023 10:41 AM  Actions taken: Pend clinical note, Clinical Note Signed, Visit diagnoses modified, Pharmacy for encounter modified, Order list changed, Diagnosis association updated

## 2023-05-05 ENCOUNTER — Inpatient Hospital Stay
Admission: EM | Admit: 2023-05-05 | Discharge: 2023-05-25 | DRG: 177 | Disposition: A | Discharge status: Skilled Nursing Facility | Payer: Medicare Other | Attending: Internal Medicine | Admitting: Internal Medicine

## 2023-05-05 ENCOUNTER — Emergency Department: Payer: Medicare Other

## 2023-05-05 ENCOUNTER — Observation Stay: Payer: Medicare Other

## 2023-05-05 ENCOUNTER — Other Ambulatory Visit: Payer: Self-pay

## 2023-05-05 DIAGNOSIS — N186 End stage renal disease: Secondary | ICD-10-CM | POA: Diagnosis not present

## 2023-05-05 DIAGNOSIS — U071 COVID-19: Principal | ICD-10-CM | POA: Diagnosis present

## 2023-05-05 DIAGNOSIS — Z9181 History of falling: Secondary | ICD-10-CM

## 2023-05-05 DIAGNOSIS — Y9301 Activity, walking, marching and hiking: Secondary | ICD-10-CM | POA: Diagnosis present

## 2023-05-05 DIAGNOSIS — I4821 Permanent atrial fibrillation: Secondary | ICD-10-CM | POA: Diagnosis present

## 2023-05-05 DIAGNOSIS — D631 Anemia in chronic kidney disease: Secondary | ICD-10-CM | POA: Diagnosis present

## 2023-05-05 DIAGNOSIS — R531 Weakness: Secondary | ICD-10-CM | POA: Diagnosis not present

## 2023-05-05 DIAGNOSIS — I10 Essential (primary) hypertension: Secondary | ICD-10-CM

## 2023-05-05 DIAGNOSIS — Z515 Encounter for palliative care: Secondary | ICD-10-CM

## 2023-05-05 DIAGNOSIS — Z87891 Personal history of nicotine dependence: Secondary | ICD-10-CM

## 2023-05-05 DIAGNOSIS — R0603 Acute respiratory distress: Secondary | ICD-10-CM | POA: Diagnosis not present

## 2023-05-05 DIAGNOSIS — N2581 Secondary hyperparathyroidism of renal origin: Secondary | ICD-10-CM | POA: Diagnosis present

## 2023-05-05 DIAGNOSIS — I132 Hypertensive heart and chronic kidney disease with heart failure and with stage 5 chronic kidney disease, or end stage renal disease: Secondary | ICD-10-CM | POA: Diagnosis present

## 2023-05-05 DIAGNOSIS — E1122 Type 2 diabetes mellitus with diabetic chronic kidney disease: Secondary | ICD-10-CM | POA: Diagnosis present

## 2023-05-05 DIAGNOSIS — R197 Diarrhea, unspecified: Secondary | ICD-10-CM | POA: Diagnosis present

## 2023-05-05 DIAGNOSIS — Z833 Family history of diabetes mellitus: Secondary | ICD-10-CM

## 2023-05-05 DIAGNOSIS — I5A Non-ischemic myocardial injury (non-traumatic): Secondary | ICD-10-CM | POA: Diagnosis not present

## 2023-05-05 DIAGNOSIS — R410 Disorientation, unspecified: Secondary | ICD-10-CM | POA: Diagnosis not present

## 2023-05-05 DIAGNOSIS — E669 Obesity, unspecified: Secondary | ICD-10-CM | POA: Diagnosis present

## 2023-05-05 DIAGNOSIS — I890 Lymphedema, not elsewhere classified: Secondary | ICD-10-CM | POA: Diagnosis present

## 2023-05-05 DIAGNOSIS — I2489 Other forms of acute ischemic heart disease: Secondary | ICD-10-CM

## 2023-05-05 DIAGNOSIS — W19XXXA Unspecified fall, initial encounter: Secondary | ICD-10-CM | POA: Diagnosis not present

## 2023-05-05 DIAGNOSIS — F4320 Adjustment disorder, unspecified: Secondary | ICD-10-CM | POA: Diagnosis present

## 2023-05-05 DIAGNOSIS — I2510 Atherosclerotic heart disease of native coronary artery without angina pectoris: Secondary | ICD-10-CM | POA: Diagnosis present

## 2023-05-05 DIAGNOSIS — Z794 Long term (current) use of insulin: Secondary | ICD-10-CM

## 2023-05-05 DIAGNOSIS — I480 Paroxysmal atrial fibrillation: Secondary | ICD-10-CM

## 2023-05-05 DIAGNOSIS — Z6832 Body mass index (BMI) 32.0-32.9, adult: Secondary | ICD-10-CM

## 2023-05-05 DIAGNOSIS — L309 Dermatitis, unspecified: Secondary | ICD-10-CM | POA: Diagnosis present

## 2023-05-05 DIAGNOSIS — Y92009 Unspecified place in unspecified non-institutional (private) residence as the place of occurrence of the external cause: Secondary | ICD-10-CM

## 2023-05-05 DIAGNOSIS — E785 Hyperlipidemia, unspecified: Secondary | ICD-10-CM | POA: Diagnosis present

## 2023-05-05 DIAGNOSIS — N19 Unspecified kidney failure: Secondary | ICD-10-CM | POA: Diagnosis present

## 2023-05-05 DIAGNOSIS — Z955 Presence of coronary angioplasty implant and graft: Secondary | ICD-10-CM

## 2023-05-05 DIAGNOSIS — Z7901 Long term (current) use of anticoagulants: Secondary | ICD-10-CM

## 2023-05-05 DIAGNOSIS — Z888 Allergy status to other drugs, medicaments and biological substances status: Secondary | ICD-10-CM

## 2023-05-05 DIAGNOSIS — N400 Enlarged prostate without lower urinary tract symptoms: Secondary | ICD-10-CM | POA: Diagnosis present

## 2023-05-05 DIAGNOSIS — H5462 Unqualified visual loss, left eye, normal vision right eye: Secondary | ICD-10-CM | POA: Diagnosis present

## 2023-05-05 DIAGNOSIS — E1165 Type 2 diabetes mellitus with hyperglycemia: Secondary | ICD-10-CM | POA: Diagnosis present

## 2023-05-05 DIAGNOSIS — E1129 Type 2 diabetes mellitus with other diabetic kidney complication: Secondary | ICD-10-CM | POA: Diagnosis present

## 2023-05-05 DIAGNOSIS — Z7982 Long term (current) use of aspirin: Secondary | ICD-10-CM

## 2023-05-05 DIAGNOSIS — F411 Generalized anxiety disorder: Secondary | ICD-10-CM | POA: Diagnosis present

## 2023-05-05 DIAGNOSIS — Z8249 Family history of ischemic heart disease and other diseases of the circulatory system: Secondary | ICD-10-CM

## 2023-05-05 DIAGNOSIS — Z992 Dependence on renal dialysis: Secondary | ICD-10-CM

## 2023-05-05 DIAGNOSIS — I5032 Chronic diastolic (congestive) heart failure: Secondary | ICD-10-CM | POA: Diagnosis present

## 2023-05-05 DIAGNOSIS — W1839XA Other fall on same level, initial encounter: Secondary | ICD-10-CM | POA: Diagnosis present

## 2023-05-05 LAB — COMPREHENSIVE METABOLIC PANEL
ALT: 26 U/L (ref 0–44)
AST: 24 U/L (ref 15–41)
Albumin: 3.2 g/dL — ABNORMAL LOW (ref 3.5–5.0)
Alkaline Phosphatase: 51 U/L (ref 38–126)
Anion gap: 22 — ABNORMAL HIGH (ref 5–15)
BUN: 102 mg/dL — ABNORMAL HIGH (ref 8–23)
CO2: 18 mmol/L — ABNORMAL LOW (ref 22–32)
Calcium: 7.3 mg/dL — ABNORMAL LOW (ref 8.9–10.3)
Chloride: 101 mmol/L (ref 98–111)
Creatinine, Ser: 10.02 mg/dL — ABNORMAL HIGH (ref 0.61–1.24)
GFR, Estimated: 5 mL/min — ABNORMAL LOW (ref >60)
Glucose, Bld: 203 mg/dL — ABNORMAL HIGH (ref 70–99)
Potassium: 4.7 mmol/L (ref 3.5–5.1)
Sodium: 141 mmol/L (ref 135–145)
Total Bilirubin: 0.8 mg/dL (ref 0.0–1.2)
Total Protein: 6.4 g/dL — ABNORMAL LOW (ref 6.5–8.1)

## 2023-05-05 LAB — LIPASE, BLOOD: Lipase: 60 U/L — ABNORMAL HIGH (ref 11–51)

## 2023-05-05 LAB — CBC
HCT: 33.4 % — ABNORMAL LOW (ref 39.0–52.0)
Hemoglobin: 10.9 g/dL — ABNORMAL LOW (ref 13.0–17.0)
MCH: 31.9 pg (ref 26.0–34.0)
MCHC: 32.6 g/dL (ref 30.0–36.0)
MCV: 97.7 fL (ref 80.0–100.0)
Platelets: 197 10*3/uL (ref 150–400)
RBC: 3.42 MIL/uL — ABNORMAL LOW (ref 4.22–5.81)
RDW: 14.4 % (ref 11.5–15.5)
WBC: 5.6 10*3/uL (ref 4.0–10.5)
nRBC: 0 % (ref 0.0–0.2)

## 2023-05-05 LAB — RESP PANEL BY RT-PCR (RSV, FLU A&B, COVID)  RVPGX2
Influenza A by PCR: NEGATIVE
Influenza B by PCR: NEGATIVE
Resp Syncytial Virus by PCR: NEGATIVE
SARS Coronavirus 2 by RT PCR: POSITIVE — AB

## 2023-05-05 LAB — TROPONIN I (HIGH SENSITIVITY)
Troponin I (High Sensitivity): 95 ng/L — ABNORMAL HIGH (ref <18)
Troponin I (High Sensitivity): 105 ng/L (ref <18)
Troponin I (High Sensitivity): 89 ng/L — ABNORMAL HIGH (ref <18)

## 2023-05-05 LAB — BRAIN NATRIURETIC PEPTIDE: B Natriuretic Peptide: 320 pg/mL — ABNORMAL HIGH (ref 0.0–100.0)

## 2023-05-05 MED ORDER — ALBUTEROL SULFATE HFA 108 (90 BASE) MCG/ACT IN AERS: 2.0000 | INHALATION_SPRAY | RESPIRATORY_TRACT | Status: DC | PRN

## 2023-05-05 MED ORDER — INSULIN ASPART 100 UNIT/ML IJ SOLN
0.0000 [IU] | Freq: Every day | INTRAMUSCULAR | Status: DC
  Administered 2023-05-09: 3 [IU] via SUBCUTANEOUS
  Administered 2023-05-11: 4 [IU] via SUBCUTANEOUS
  Administered 2023-05-16: 2 [IU] via SUBCUTANEOUS
  Administered 2023-05-17 – 2023-05-18 (×2): 3 [IU] via SUBCUTANEOUS
  Filled 2023-05-05 (×8): qty 1

## 2023-05-05 MED ORDER — VITAMIN C 500 MG PO TABS
250.0000 mg | ORAL_TABLET | Freq: Every day | ORAL | Status: DC
  Administered 2023-05-06 – 2023-05-25 (×20): 250 mg via ORAL
  Filled 2023-05-05 (×21): qty 1

## 2023-05-05 MED ORDER — RENA-VITE PO TABS
1.0000 | ORAL_TABLET | Freq: Every day | ORAL | Status: DC
  Administered 2023-05-06 – 2023-05-24 (×16): 1 via ORAL
  Filled 2023-05-05 (×20): qty 1

## 2023-05-05 MED ORDER — NITROGLYCERIN 0.4 MG/SPRAY TL SOLN: 1.0000 | Status: DC | PRN

## 2023-05-05 MED ORDER — ROSUVASTATIN CALCIUM 10 MG PO TABS
10.0000 mg | ORAL_TABLET | ORAL | Status: DC
  Administered 2023-05-07 – 2023-05-24 (×9): 10 mg via ORAL
  Filled 2023-05-05 (×10): qty 1

## 2023-05-05 MED ORDER — ACETAMINOPHEN 325 MG PO TABS: 650.0000 mg | ORAL_TABLET | Freq: Four times a day (QID) | ORAL | Status: DC | PRN

## 2023-05-05 MED ORDER — DM-GUAIFENESIN ER 30-600 MG PO TB12
1.0000 | ORAL_TABLET | Freq: Two times a day (BID) | ORAL | Status: DC | PRN
  Administered 2023-05-16 – 2023-05-21 (×2): 1 via ORAL
  Filled 2023-05-05 (×2): qty 1

## 2023-05-05 MED ORDER — APIXABAN 2.5 MG PO TABS
2.5000 mg | ORAL_TABLET | Freq: Two times a day (BID) | ORAL | Status: DC
  Administered 2023-05-06 (×2): 2.5 mg via ORAL
  Filled 2023-05-05 (×2): qty 1

## 2023-05-05 MED ORDER — ONDANSETRON HCL 4 MG/2ML IJ SOLN
4.0000 mg | Freq: Three times a day (TID) | INTRAMUSCULAR | Status: DC | PRN
  Administered 2023-05-10: 4 mg via INTRAVENOUS
  Filled 2023-05-05: qty 2

## 2023-05-05 MED ORDER — INSULIN ASPART 100 UNIT/ML IJ SOLN
0.0000 [IU] | Freq: Three times a day (TID) | INTRAMUSCULAR | Status: DC
  Administered 2023-05-06 – 2023-05-07 (×3): 1 [IU] via SUBCUTANEOUS
  Administered 2023-05-08 (×2): 2 [IU] via SUBCUTANEOUS
  Administered 2023-05-08 – 2023-05-09 (×2): 1 [IU] via SUBCUTANEOUS
  Administered 2023-05-09: 3 [IU] via SUBCUTANEOUS
  Administered 2023-05-09: 5 [IU] via SUBCUTANEOUS
  Administered 2023-05-10: 2 [IU] via SUBCUTANEOUS
  Administered 2023-05-10: 3 [IU] via SUBCUTANEOUS
  Administered 2023-05-11: 5 [IU] via SUBCUTANEOUS
  Administered 2023-05-12: 2 [IU] via SUBCUTANEOUS
  Administered 2023-05-13: 3 [IU] via SUBCUTANEOUS
  Administered 2023-05-13: 1 [IU] via SUBCUTANEOUS
  Administered 2023-05-14: 3 [IU] via SUBCUTANEOUS
  Administered 2023-05-16: 1 [IU] via SUBCUTANEOUS
  Administered 2023-05-16 (×2): 2 [IU] via SUBCUTANEOUS
  Administered 2023-05-17: 1 [IU] via SUBCUTANEOUS
  Administered 2023-05-17: 3 [IU] via SUBCUTANEOUS
  Administered 2023-05-18: 2 [IU] via SUBCUTANEOUS
  Administered 2023-05-18: 1 [IU] via SUBCUTANEOUS
  Administered 2023-05-18: 2 [IU] via SUBCUTANEOUS
  Administered 2023-05-19 – 2023-05-20 (×3): 1 [IU] via SUBCUTANEOUS
  Administered 2023-05-21: 3 [IU] via SUBCUTANEOUS
  Administered 2023-05-22: 1 [IU] via SUBCUTANEOUS
  Administered 2023-05-22 – 2023-05-23 (×2): 3 [IU] via SUBCUTANEOUS
  Administered 2023-05-23 – 2023-05-24 (×2): 2 [IU] via SUBCUTANEOUS
  Filled 2023-05-05 (×35): qty 1

## 2023-05-05 MED ORDER — DORZOLAMIDE HCL-TIMOLOL MAL 2-0.5 % OP SOLN
1.0000 [drp] | Freq: Every day | OPHTHALMIC | Status: DC
  Administered 2023-05-06 – 2023-05-25 (×19): 1 [drp] via OPHTHALMIC
  Filled 2023-05-05: qty 10

## 2023-05-05 MED ORDER — LOPERAMIDE HCL 2 MG PO CAPS
2.0000 mg | ORAL_CAPSULE | Freq: Two times a day (BID) | ORAL | Status: DC | PRN
  Administered 2023-05-06: 2 mg via ORAL
  Filled 2023-05-05: qty 1

## 2023-05-05 MED ORDER — HYDRALAZINE HCL 20 MG/ML IJ SOLN: 5.0000 mg | INTRAMUSCULAR | Status: DC | PRN

## 2023-05-05 MED ORDER — NEOMYCIN-POLYMYXIN-DEXAMETH 3.5-10000-0.1 OP OINT
1.0000 | TOPICAL_OINTMENT | Freq: Three times a day (TID) | OPHTHALMIC | Status: DC
  Administered 2023-05-06 – 2023-05-25 (×50): 1 via OPHTHALMIC
  Filled 2023-05-05 (×2): qty 3.5

## 2023-05-05 NOTE — ED Provider Notes (Signed)
[facility] Provider Note    Event Date/Time   First MD Initiated Contact with Patient 05/05/23 1507     (approximate)   History   Fall   HPI  Kyle Roberson is a 83 y.o. male with a history of CHF, hypertension, hyperlipidemia, type 2 diabetes, COPD, ESRD on intermittent HD, and atrial fibrillation who presents with a fall after his left leg gave out earlier this morning.  The patient states that he was going to the bathroom when he suddenly felt weak in his left leg which caused him to fall.  He did not hit his head or lose consciousness.  He did not feel lightheaded or weak prior to the fall.  He states that he has been in his usual state of health and has otherwise been feeling well recently.  He denies any prior episodes like this.  Currently he has no weakness or numbness in the left leg or in any other extremities.  He states he is feeling fine right now.  I reviewed the past medical records.  The patient was most recent admitted to the hospitalist service in September of last year for overload.   Physical Exam   Triage Vital Signs: ED Triage Vitals  Encounter Vitals Group     BP 05/05/23 1310 (!) 154/53     Systolic BP Percentile --      Diastolic BP Percentile --      Pulse Rate 05/05/23 1310 (!) 54     Resp 05/05/23 1310 19     Temp 05/05/23 1310 98 F (36.7 C)     Temp src --      SpO2 05/05/23 1310 99 %     Weight 05/05/23 1310 210 lb (95.3 kg)     Height 05/05/23 1310 5' 8.5" (1.74 m)     Head Circumference --      Peak Flow --      Pain Score 05/05/23 1308 0     Pain Loc --      Pain Education --      Exclude from Growth Chart --     Most recent vital signs: Vitals:   05/05/23 1730 05/05/23 1800  BP: (!) 148/53 (!) 122/96  Pulse: (!) 54 (!) 44  Resp: 18 19  Temp:    SpO2: 97% 100%     General: Alert and oriented,, no distress.  CV:  Good peripheral perfusion.  Resp:  Normal effort.  Abd:  No distention.   Other:  EOMI.  Right pupil round and reactive, left eye is postsurgical and nonreactive.  Motor intact in all extremities.  Mild ataxia bilaterally on finger-to-nose which patient states is baseline.  Bilateral significant lower extremity edema and venous stasis type changes.   ED Results / Procedures / Treatments   Labs (all labs ordered are listed, but only abnormal results are displayed) Labs Reviewed  RESP PANEL BY RT-PCR (RSV, FLU A&B, COVID)  RVPGX2 - Abnormal; Notable for the following components:      Result Value   SARS Coronavirus 2 by RT PCR POSITIVE (*)    All other components within normal limits  LIPASE, BLOOD - Abnormal; Notable for the following components:   Lipase 60 (*)    All other components within normal limits  COMPREHENSIVE METABOLIC PANEL - Abnormal; Notable for the following components:   CO2 18 (*)    Glucose, Bld 203 (*)    BUN 102 (*)    Creatinine, Ser 10.02 (*)  Calcium 7.3 (*)    Total Protein 6.4 (*)    Albumin 3.2 (*)    GFR, Estimated 5 (*)    Anion gap 22 (*)    All other components within normal limits  CBC - Abnormal; Notable for the following components:   RBC 3.42 (*)    Hemoglobin 10.9 (*)    HCT 33.4 (*)    All other components within normal limits  BRAIN NATRIURETIC PEPTIDE - Abnormal; Notable for the following components:   B Natriuretic Peptide 320.0 (*)    All other components within normal limits  TROPONIN I (HIGH SENSITIVITY) - Abnormal; Notable for the following components:   Troponin I (High Sensitivity) 95 (*)    All other components within normal limits  TROPONIN I (HIGH SENSITIVITY) - Abnormal; Notable for the following components:   Troponin I (High Sensitivity) 105 (*)    All other components within normal limits  URINALYSIS, ROUTINE W REFLEX MICROSCOPIC     EKG  ED ECG REPORT I, Dionne Bucy, the attending physician, personally viewed and interpreted this ECG.  Date: 05/05/2023 EKG Time: 1637 Rate:  56 Rhythm: Atrial fibrillation QRS Axis: normal Intervals: Nonspecific IVCD ST/T Wave abnormalities: Nonspecific ST abnormalities Narrative Interpretation: no evidence of acute ischemia    RADIOLOGY  CT head: I independently viewed and interpreted the images; there is no ICH.  Radiology report indicates no acute abnormality.  PROCEDURES:  Critical Care performed: No  Procedures   MEDICATIONS ORDERED IN ED: Medications  albuterol (VENTOLIN HFA) 108 (90 Base) MCG/ACT inhaler 2 puff (has no administration in time range)  dextromethorphan-guaiFENesin (MUCINEX DM) 30-600 MG per 12 hr tablet 1 tablet (has no administration in time range)  ondansetron (ZOFRAN) injection 4 mg (has no administration in time range)  hydrALAZINE (APRESOLINE) injection 5 mg (has no administration in time range)  acetaminophen (TYLENOL) tablet 650 mg (has no administration in time range)     IMPRESSION / MDM / ASSESSMENT AND PLAN / ED COURSE  I reviewed the triage vital signs and the nursing notes.  83 year old male with PMH as noted above presents with a fall after stating that his left leg suddenly felt weak and gave out.  However he denies any weakness or numbness in the leg at this time.  He did not hit his head or lose consciousness.  Physical exam including thorough neurologic exam is unremarkable for acute findings.  The patient has significant bilateral lower extremity edema.  Differential diagnosis includes, but is not limited to, peripheral neuropathy, radiculopathy, hypovolemia, dehydration, electrolyte abnormality, other metabolic disturbance, influenza or other viral syndrome, UTI.  We will obtain lab workup, CT head, and reassess.  The patient receives dialysis M/W/F and last had it on Monday 2 days ago.  Patient's presentation is most consistent with acute presentation with potential threat to life or bodily function.  The patient is on the cardiac monitor to evaluate for evidence of  arrhythmia and/or significant heart rate changes.  ----------------------------------------- 6:36 PM on 05/05/2023 -----------------------------------------  CT head is negative.  Lab workup is significant for respiratory panel positive for COVID.  Troponin is also somewhat elevated, likely demand ischemia related to acute COVID infection.  Other labs are unremarkable.  Creatinine is significantly elevated but potassium is normal and admission for further management.  I consulted Dr. Clyde Lundborg from the hospitalist service; based on our discussion he agrees to evaluate the patient for admission.   FINAL CLINICAL IMPRESSION(S) / ED DIAGNOSES   Final diagnoses:  Fall, initial encounter  COVID-19  Weakness     Rx / DC Orders   ED Discharge Orders     None        Note:  This document was prepared using Dragon voice recognition software and may include unintentional dictation errors.    Dionne Bucy, MD 05/05/23 [phone]

## 2023-05-05 NOTE — Progress Notes (Signed)
The patient is admitted form ED to 1 A 43. A & O x 4. The patient is not coperative right now. He's refused lab drawn stated they just drew them at ED and rejected this RN explanation why they are needed. He was asked about SDOH food/transportation/housing screening. His answer was, "Don't ask me those "damn " questions as they're my problem and not the hospital problem." He seems to be agitated in any little thing. Admission profile  was not completed as a result of that. I was checking for pitting edema on his legs and he said, "if you do that again I'll hit you because  it hurts." Dr. Clyde Lundborg notified of patient refusal of labs.

## 2023-05-05 NOTE — ED Notes (Signed)
Report off to beth rn cpod nurse.  Pt moved to cpod room 30

## 2023-05-05 NOTE — ED Triage Notes (Signed)
First Nurse Note:  Pt via [facility] from home. Pt had a fall today. Pt c/o diarrhea for the past 2 weeks. Denies head injury but reports that he takes Eliquis. Pt is A&Ox4 and NAD 150/50 BP  60 HR afib with hx  18 RR 100% on RA 181 CBG

## 2023-05-05 NOTE — H&P (Signed)
 History and Physical    Kyle Roberson ZSW:[id number] DOB: 1940-12-08 DOA: 05/05/2023  Referring MD/NP/PA:   PCP: [name], MD   Patient coming from:  The patient is coming from home.     Chief Complaint: Cough, shortness of breath, diarrhea, fall  HPI: Kyle Roberson is a 83 y.o. male with medical history significant of ESRD-HD (MWF), HTN, HLD, DM, CAD with stent, dCHF, depression with anxiety, BPH, anemia, A-fib on Eliquis, left eye blindness, who presents with cough, shortness of breath, diarrhea, fall.  Per pt and his wife (I called his wife by phone), patient has productive cough, sore throat, SOB, malaise for more than 5 days.  No chest pain.  No fever or chills.  Patient has diarrhea for more than 10 days, with 2- 3 times of watery diarrhea each day.  No nausea, vomiting or abdominal pain.  Patient fell this morning, no loss of consciousness. He patient states that he was going to the bathroom when he suddenly felt weak in his gave out and fell.  No unilateral numbness or tingling in extremities.  No facial droop or slurred speech.  Patient states that he has missed several dialysis. His last dialysis was on Monday.     Data reviewed independently and ED Course: pt was found to have positive COVID 19, WBC 5.6, troponin 95 --> 105, potassium 4.7, bicarbonate 18, creatinine 10.02, BUN 102.  CT head negative. Chest x-ray showed cardiomegaly with vascular congestion.  Patient is placed on telemetry bed for observation.   EKG: I have personally reviewed.  A-fib, QTc 461, low voltage, poor IV progression, anteroseptal infarction pattern.   Review of Systems:   General: no fevers, chills, no body weight gain, has poor appetite, has fatigue HEENT: no blurry vision, hearing changes or sore throat Respiratory: has dyspnea, coughing, no wheezing CV: no chest pain, no palpitations GI: no nausea, vomiting, abdominal pain, has diarrhea, no constipation GU: no dysuria, burning  on urination, increased urinary frequency, hematuria  Ext: has leg edema Neuro: no unilateral weakness, numbness, or tingling, no vision change or hearing loss. Has fall Skin: no rash, no skin tear. MSK: No muscle spasm, no deformity, no limitation of range of movement in spin Heme: No easy bruising.  Travel history: No recent long distant travel.   Allergy:  Allergies  Allergen Reactions   Atorvastatin Hives, Itching and Other (See Comments)    Other reaction(s): Other (See Comments)   Simvastatin Hives, Itching and Other (See Comments)    Other reaction(s): Other (See Comments)    Past Medical History:  Diagnosis Date   AKI (acute kidney injury) (HCC) 07/06/2018   CAD (coronary artery disease)    a. Remote PCI/stenting to LAD w PTCA Diagnoal. RCA 60%; b.  2005/2008 Cardiolites w/ reportedly mild ischemia in Diag territory-->Med rx.   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 04/2019 Echo: EF 55-60%, no rwma, nl RV fxn, RVSP 55.40mmHg. Mod dil LA. Mild-mod MR. Mod dil PA.   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 04/2019 Echo: RVSP 55.58mmHg.   Permanent atrial fibrillation (HCC)    a. CHA2DS2VASc = 5-->dose adjusted eliquis (age/creat).   Subdural hematoma (  HCC)    a. 01/2021 in setting of fall s/p L frontotemporal craniotomy.    Past Surgical History:  Procedure Laterality Date   BLEPHAROPLASTY     CARDIAC CATHETERIZATION     CORONARY STENT INTERVENTION     CRANIOTOMY Left 02/04/2021   Procedure: CRANIOTOMY FOR LEFT SUBDURAL  HEMATOMA EVACUATION;  Surgeon: Lucy Chris, MD;  Location: [facility] ORS;  Service: Neurosurgery;  Laterality: Left;   CYSTOSCOPY     DIALYSIS/PERMA CATHETER INSERTION  N/A 12/14/2022   Procedure: DIALYSIS/PERMA CATHETER INSERTION;  Surgeon: Annice Needy, MD;  Location: [facility] INVASIVE CV LAB;  Service: Cardiovascular;  Laterality: N/A;   DIALYSIS/PERMA CATHETER REPAIR N/A 12/16/2022   Procedure: DIALYSIS/PERMA CATHETER REPAIR;  Surgeon: Annice Needy, MD;  Location: [facility] INVASIVE CV LAB;  Service: Cardiovascular;  Laterality: N/A;   HERNIA REPAIR     RIGHT HEART CATH N/A 12/02/2022   Procedure: RIGHT HEART CATH;  Surgeon: Laurey Morale, MD;  [facility] INVASIVE CV LAB;  Service: Cardiovascular;  Laterality: N/A;    Social History:  reports that he has quit smoking. He has never used smokeless tobacco. He reports that he does not currently use alcohol after a past usage of about 2.0 standard drinks of alcohol per week. He reports that he does not currently use drugs.  Family History:  Family History  Problem Relation Age of Onset   Pancreatic cancer Mother    CAD Father    Diabetes Brother      Prior to Admission medications   Medication Sig Start Date End Date Taking? Authorizing Provider  acetaminophen (TYLENOL) 325 MG tablet Take 1-2 tablets (325-650 mg total) by mouth every 4 (four) hours as needed for mild pain (pain score 1-3). 01/18/23   Setzer, Lynnell Jude, PA-C  apixaban (ELIQUIS) 2.5 MG TABS tablet Take 1 tablet (2.5 mg total) by mouth 2 (two) times daily. 11/26/22   Bensimhon, Bevelyn Buckles, MD  Ascorbic Acid (VITAMIN C PO) Take 1 tablet by mouth in the morning.    [provider]  Calcium Acetate 667 MG TABS Take 1 tablet by mouth in the morning, at noon, and at bedtime.    [provider]  Cholecalciferol (VITAMIN D3) 1.25 MG (50000 UT) CAPS Take 1 tablet by mouth in the morning.    [provider]  hydrocerin (EUCERIN) CREA Apply 1 Application topically 3 (three) times daily. 01/18/23   Setzer, Lynnell Jude, PA-C  multivitamin (RENA-VIT) TABS tablet Take 1 tablet by mouth at bedtime. 01/18/23   Setzer, Lynnell Jude, PA-C   neomycin-polymyxin b-dexamethasone (MAXITROL) 3.5-10000-0.1 OINT Place 1 Application into the right eye 3 (three) times daily. 01/18/23   Setzer, Lynnell Jude, PA-C  nitroGLYCERIN (NITROLINGUAL) 0.4 MG/SPRAY spray Place 1 spray under the tongue as directed. 04/29/23 05/17/24  Bensimhon, Bevelyn Buckles, MD  Omega-3 Fatty Acids (FISH OIL PO) Take 1 capsule by mouth in the morning.    [provider]  rosuvastatin (CRESTOR) 10 MG tablet Take 1 tablet (10 mg total) by mouth 3 (three) times a week. 04/30/23   Bensimhon, Bevelyn Buckles, MD    Physical Exam: Vitals:   05/05/23 1830 05/05/23 2121 05/05/23 2217 05/05/23 2224  BP: (!) 158/66 (!) 175/90  (!) 145/55  Pulse: (!) 57 62  70  Resp: 18 16  20   Temp:  98.4 F (36.9 C)  (P) 97.6 F (36.4 C)  TempSrc:  Oral  (P) Oral  SpO2: 100% 98%  100%  Weight:   98.4  kg   Height:   5\' 9"  (1.753 m)    General: Not in acute distress HEENT:       Eyes: right eye with PERRL, EOMI, no jaundice. Left eye blind.       ENT: No discharge from the ears and nose, no pharynx injection, no tonsillar enlargement.        Neck: No JVD, no bruit, no mass felt. Heme: No neck lymph node enlargement. Cardiac: S1/S2, irregularly irregular rhythm,, No gallops or rubs. Respiratory: No rales, wheezing, rhonchi or rubs. GI: Soft, nondistended, nontender, no rebound pain, no organomegaly, BS present. GU: No hematuria Ext: has 1+ leg edema bilaterally. 1+DP/PT pulse bilaterally. Musculoskeletal: No joint deformities, No joint redness or warmth, no limitation of ROM in spin. Skin: No rashes.  Neuro: Alert, oriented X3, cranial nerves II-XII grossly intact, moves all extremities normally Psych: Patient is not psychotic, no suicidal or hemocidal ideation.  Labs on Admission: I have personally reviewed following labs and imaging studies  CBC: Recent Labs  Lab 05/05/23 1313  WBC 5.6  HGB 10.9*  HCT 33.4*  MCV 97.7  PLT 197   Basic Metabolic Panel: Recent Labs  Lab  05/05/23 1313  NA 141  K 4.7  CL 101  CO2 18*  GLUCOSE 203*  BUN 102*  CREATININE 10.02*  CALCIUM 7.3*   GFR: Estimated Creatinine Clearance: 6.3 mL/min (A) (by C-G formula based on SCr of 10.02 mg/dL (H)). Liver Function Tests: Recent Labs  Lab 05/05/23 1313  AST 24  ALT 26  ALKPHOS 51  BILITOT 0.8  PROT 6.4*  ALBUMIN 3.2*   Recent Labs  Lab 05/05/23 1313  LIPASE 60*   No results for input(s): "AMMONIA" in the last 168 hours. Coagulation Profile: No results for input(s): "INR", "PROTIME" in the last 168 hours. Cardiac Enzymes: No results for input(s): "CKTOTAL", "CKMB", "CKMBINDEX", "TROPONINI" in the last 168 hours. BNP (last 3 results) No results for input(s): "PROBNP" in the last 8760 hours. HbA1C: No results for input(s): "HGBA1C" in the last 72 hours. CBG: No results for input(s): "GLUCAP" in the last 168 hours. Lipid Profile: No results for input(s): "CHOL", "HDL", "LDLCALC", "TRIG", "CHOLHDL", "LDLDIRECT" in the last 72 hours. Thyroid Function Tests: No results for input(s): "TSH", "T4TOTAL", "FREET4", "T3FREE", "THYROIDAB" in the last 72 hours. Anemia Panel: No results for input(s): "VITAMINB12", "FOLATE", "FERRITIN", "TIBC", "IRON", "RETICCTPCT" in the last 72 hours. Urine analysis:    Component Value Date/Time   COLORURINE YELLOW (A) 02/04/2021 1358   APPEARANCEUR CLEAR (A) 02/04/2021 1358   LABSPEC 1.014 02/04/2021 1358   PHURINE 5.0 02/04/2021 1358   GLUCOSEU NEGATIVE 02/04/2021 1358   HGBUR NEGATIVE 02/04/2021 1358   BILIRUBINUR NEGATIVE 02/04/2021 1358   BILIRUBINUR neg 01/10/2019 1628   KETONESUR NEGATIVE 02/04/2021 1358   PROTEINUR 100 (A) 02/04/2021 1358   UROBILINOGEN 0.2 01/10/2019 1628   NITRITE NEGATIVE 02/04/2021 1358   LEUKOCYTESUR NEGATIVE 02/04/2021 1358   Sepsis Labs: @LABRCNTIP (procalcitonin:4,lacticidven:4) ) Recent Results (from the past 240 hours)  Resp panel by RT-PCR (RSV, Flu A&B, Covid) Anterior Nasal Swab      Status: Abnormal   Collection Time: 05/05/23  4:33 PM   Specimen: Anterior Nasal Swab  Result Value Ref Range Status   SARS Coronavirus 2 by RT PCR POSITIVE (A) NEGATIVE Final    Comment: (NOTE) SARS-CoV-2 target nucleic acids are DETECTED.  The SARS-CoV-2 RNA is generally detectable in upper respiratory specimens during the acute phase of infection. Positive results are indicative of  the presence of the identified virus, but do not rule out bacterial infection or co-infection with other pathogens not detected by the test. Clinical correlation with patient history and other diagnostic information is necessary to determine patient infection status. The expected result is Negative.  Fact Sheet for Patients: BloggerCourse.com  Fact Sheet for Healthcare Providers: SeriousBroker.it  This test is not yet approved or cleared by the [place] FDA and  has been authorized for detection and/or diagnosis of SARS-CoV-2 by FDA under an Emergency Use Authorization (EUA).  This EUA will remain in effect (meaning this test can be used) for the duration of  the COVID-19 declaration under Section 564(b)(1) of the A ct, 21 U.S.C. section 360bbb-3(b)(1), unless the authorization is terminated or revoked sooner.     Influenza A by PCR NEGATIVE NEGATIVE Final   Influenza B by PCR NEGATIVE NEGATIVE Final    Comment: (NOTE) The Xpert Xpress SARS-CoV-2/FLU/RSV plus assay is intended as an aid in the diagnosis of influenza from Nasopharyngeal swab specimens and should not be used as a sole basis for treatment. Nasal washings and aspirates are unacceptable for Xpert Xpress SARS-CoV-2/FLU/RSV testing.  Fact Sheet for Patients: BloggerCourse.com  Fact Sheet for Healthcare Providers: SeriousBroker.it  This test is not yet approved or cleared by the [place] FDA and has been authorized for  detection and/or diagnosis of SARS-CoV-2 by FDA under an Emergency Use Authorization (EUA). This EUA will remain in effect (meaning this test can be used) for the duration of the COVID-19 declaration under Section 564(b)(1) of the Act, 21 U.S.C. section 360bbb-3(b)(1), unless the authorization is terminated or revoked.     Resp Syncytial Virus by PCR NEGATIVE NEGATIVE Final    Comment: (NOTE) Fact Sheet for Patients: BloggerCourse.com  Fact Sheet for Healthcare Providers: SeriousBroker.it  This test is not yet approved or cleared by the [place] FDA and has been authorized for detection and/or diagnosis of SARS-CoV-2 by FDA under an Emergency Use Authorization (EUA). This EUA will remain in effect (meaning this test can be used) for the duration of the COVID-19 declaration under Section 564(b)(1) of the Act, 21 U.S.C. section 360bbb-3(b)(1), unless the authorization is terminated or revoked.  Performed at [facility], [address], [place], [place] [address]      Radiological Exams on Admission:   Assessment/Plan Principal Problem:   COVID-19 virus infection Active Problems:   CAD (coronary artery disease)   Myocardial injury   ESRD on dialysis [facility])   Fall at home, initial encounter   Paroxysmal atrial fibrillation (HCC)   Type II diabetes mellitus with renal manifestations (HCC)   Essential hypertension   Dyslipidemia   Diarrhea   Obesity (BMI 30-39.9)   Assessment and Plan:  COVID-19 virus infection: No fever, no leukocytosis, no oxygen desaturation.  Chest x-ray showed vascular congestion, no infiltration.  -Placed on TeleMed follow-up patient -Bronchodilators and as needed Mucinex -Supportive care  CAD (coronary artery disease) and Myocardial injury: Troponin 95 --> 105.  No chest pain -Will not give aspirin since patient is on Eliquis -Crestor -Trend troponin -Check A1c, FLP  ESRD on  dialysis [facility]) -consulted Dr. Cherylann Ratel of renal  Fall at home, initial encounter: CT-head negative -Fall precaution -PT/OT  Paroxysmal atrial fibrillation [facility]): Heart rate 44/58 -Tele monitoring -Continue Eliquis  Type II diabetes mellitus with renal manifestations [facility]): Recent A1c 7.3, poorly controlled for patient taking glipizide at home -SSI  Essential hypertension: Patient's not taking medications.  Blood pressure 122/96 -IV hydralazine  as needed  Dyslipidemia -Crestor  Diarrhea: Possibly due to COVID infection -As needed Imodium -Check C. Difficile  Obesity (BMI 30-39.9): Body weight 98.4 kg, BMI 32.04 -Exercise and healthy diet -Encourage losing weight      DVT ppx: on Eliquis  Code Status: Full code     Family Communication:  I called his wife by phone         Disposition Plan:  Anticipate discharge back to previous environment  Consults called:  Dr. Cherylann Ratel of renal  Admission status and Level of care: Telemetry Medical:    for obs     Dispo: The patient is from: Home              Anticipated d/c is to: Home              Anticipated d/c date is: 1 day              Patient currently is not medically stable to d/c.    Severity of Illness:  The appropriate patient status for this patient is OBSERVATION. Observation status is judged to be reasonable and necessary in order to provide the required intensity of service to ensure the patient's safety. The patient's presenting symptoms, physical exam findings, and initial radiographic and laboratory data in the context of their medical condition is felt to place them at decreased risk for further clinical deterioration. Furthermore, it is anticipated that the patient will be medically stable for discharge from the hospital within 2 midnights of admission.        Date of Service 05/05/2023    Lorretta Harp [facility]     If 7PM-7AM, please contact night-coverage [email or web address] 05/05/2023, 11:36  PM

## 2023-05-05 NOTE — ED Triage Notes (Addendum)
Pt comes with c/o fall. Pt stats his leg just gave out on him. Pt denies any loc. Pt is on thinner. Pt states some diarrhea also. Pt does have severe swelling in legs and feet and pt states it has been increased here lately.

## 2023-05-06 ENCOUNTER — Encounter (HOSPITAL_COMMUNITY): Payer: Self-pay

## 2023-05-06 DIAGNOSIS — U071 COVID-19: Secondary | ICD-10-CM | POA: Diagnosis not present

## 2023-05-06 DIAGNOSIS — I25118 Atherosclerotic heart disease of native coronary 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: Secondary | ICD-10-CM

## 2023-05-06 DIAGNOSIS — I272 Pulmonary hypertension, unspecified: Secondary | ICD-10-CM

## 2023-05-06 DIAGNOSIS — W19XXXA Unspecified fall, initial encounter: Secondary | ICD-10-CM | POA: Diagnosis not present

## 2023-05-06 DIAGNOSIS — R531 Weakness: Secondary | ICD-10-CM | POA: Diagnosis not present

## 2023-05-06 DIAGNOSIS — I2489 Other forms of acute ischemic heart disease: Secondary | ICD-10-CM | POA: Diagnosis not present

## 2023-05-06 DIAGNOSIS — N184 Chronic kidney disease, stage 4 (severe): Secondary | ICD-10-CM

## 2023-05-06 LAB — HEMOGLOBIN A1C
Hgb A1c MFr Bld: 7.5 % — ABNORMAL HIGH (ref 4.8–5.6)
Mean Plasma Glucose: 168.55 mg/dL

## 2023-05-06 LAB — APTT
aPTT: 52 s — ABNORMAL HIGH (ref 24–36)
aPTT: 161 s (ref 24–36)

## 2023-05-06 LAB — BASIC METABOLIC PANEL
Anion gap: 22 — ABNORMAL HIGH (ref 5–15)
BUN: 115 mg/dL — ABNORMAL HIGH (ref 8–23)
CO2: 15 mmol/L — ABNORMAL LOW (ref 22–32)
Calcium: 7 mg/dL — ABNORMAL LOW (ref 8.9–10.3)
Chloride: 103 mmol/L (ref 98–111)
Creatinine, Ser: 10.54 mg/dL — ABNORMAL HIGH (ref 0.61–1.24)
GFR, Estimated: 4 mL/min — ABNORMAL LOW (ref >60)
Glucose, Bld: 222 mg/dL — ABNORMAL HIGH (ref 70–99)
Potassium: 4.6 mmol/L (ref 3.5–5.1)
Sodium: 140 mmol/L (ref 135–145)

## 2023-05-06 LAB — C DIFFICILE QUICK SCREEN W PCR REFLEX
C Diff antigen: NEGATIVE
C Diff interpretation: NOT DETECTED
C Diff toxin: NEGATIVE

## 2023-05-06 LAB — CBC
HCT: 31.5 % — ABNORMAL LOW (ref 39.0–52.0)
Hemoglobin: 10.6 g/dL — ABNORMAL LOW (ref 13.0–17.0)
MCH: 32.3 pg (ref 26.0–34.0)
MCHC: 33.7 g/dL (ref 30.0–36.0)
MCV: 96 fL (ref 80.0–100.0)
Platelets: 191 10*3/uL (ref 150–400)
RBC: 3.28 MIL/uL — ABNORMAL LOW (ref 4.22–5.81)
RDW: 14.3 % (ref 11.5–15.5)
WBC: 6.4 10*3/uL (ref 4.0–10.5)
nRBC: 0 % (ref 0.0–0.2)

## 2023-05-06 LAB — FOLATE: Folate: >40 ng/mL (ref >5.9)

## 2023-05-06 LAB — HEPARIN LEVEL (UNFRACTIONATED): Heparin Unfractionated: >1.1 [IU]/mL — ABNORMAL HIGH (ref 0.30–0.70)

## 2023-05-06 LAB — TROPONIN I (HIGH SENSITIVITY)
Troponin I (High Sensitivity): 377 ng/L (ref <18)
Troponin I (High Sensitivity): 680 ng/L (ref <18)
Troponin I (High Sensitivity): 894 ng/L (ref <18)
Troponin I (High Sensitivity): 97 ng/L — ABNORMAL HIGH (ref <18)

## 2023-05-06 LAB — LIPID PANEL
Cholesterol: 139 mg/dL (ref 0–200)
HDL: 32 mg/dL — ABNORMAL LOW (ref >40)
LDL Cholesterol: 77 mg/dL (ref 0–99)
Total CHOL/HDL Ratio: 4.3 {ratio}
Triglycerides: 150 mg/dL — ABNORMAL HIGH (ref <150)
VLDL: 30 mg/dL (ref 0–40)

## 2023-05-06 LAB — VITAMIN B12: Vitamin B-12: 792 pg/mL (ref 180–914)

## 2023-05-06 LAB — GLUCOSE, CAPILLARY
Glucose-Capillary: 176 mg/dL — ABNORMAL HIGH (ref 70–99)
Glucose-Capillary: 106 mg/dL — ABNORMAL HIGH (ref 70–99)
Glucose-Capillary: 135 mg/dL — ABNORMAL HIGH (ref 70–99)
Glucose-Capillary: 128 mg/dL — ABNORMAL HIGH (ref 70–99)
Glucose-Capillary: 155 mg/dL — ABNORMAL HIGH (ref 70–99)

## 2023-05-06 LAB — PROTIME-INR
INR: 1.3 — ABNORMAL HIGH (ref 0.8–1.2)
Prothrombin Time: 16.7 s — ABNORMAL HIGH (ref 11.4–15.2)

## 2023-05-06 LAB — PHOSPHORUS: Phosphorus: 11.9 mg/dL — ABNORMAL HIGH (ref 2.5–4.6)

## 2023-05-06 LAB — MAGNESIUM: Magnesium: 1.9 mg/dL (ref 1.7–2.4)

## 2023-05-06 LAB — HEPATITIS B SURFACE ANTIGEN: Hepatitis B Surface Ag: NONREACTIVE

## 2023-05-06 LAB — VITAMIN D 25 HYDROXY (VIT D DEFICIENCY, FRACTURES): Vit D, 25-Hydroxy: 67.06 ng/mL (ref 30–100)

## 2023-05-06 MED ORDER — HEPARIN (PORCINE) 25000 UT/250ML-% IV SOLN
1050.0000 [IU]/h | INTRAVENOUS | Status: DC
  Administered 2023-05-06: 1050 [IU]/h via INTRAVENOUS
  Filled 2023-05-06: qty 250

## 2023-05-06 MED ORDER — ASPIRIN 81 MG PO CHEW
81.0000 mg | CHEWABLE_TABLET | Freq: Every day | ORAL | Status: DC
  Administered 2023-05-06 – 2023-05-07 (×2): 81 mg via ORAL
  Filled 2023-05-06 (×2): qty 1

## 2023-05-06 MED ORDER — CALCIUM ACETATE (PHOS BINDER) 667 MG PO CAPS
667.0000 mg | ORAL_CAPSULE | Freq: Three times a day (TID) | ORAL | Status: DC
  Administered 2023-05-06 – 2023-05-07 (×2): 667 mg via ORAL
  Filled 2023-05-06 (×3): qty 1

## 2023-05-06 MED ORDER — HEPARIN (PORCINE) 25000 UT/250ML-% IV SOLN: 800.0000 [IU]/h | INTRAVENOUS | Status: DC

## 2023-05-06 MED ORDER — CLOTRIMAZOLE-BETAMETHASONE 1-0.05 % EX CREA
TOPICAL_CREAM | Freq: Two times a day (BID) | CUTANEOUS | Status: DC
  Administered 2023-05-17 – 2023-05-24 (×2): 1 via TOPICAL
  Filled 2023-05-06 (×3): qty 15

## 2023-05-06 MED ORDER — METOPROLOL TARTRATE 25 MG PO TABS
12.5000 mg | ORAL_TABLET | Freq: Two times a day (BID) | ORAL | Status: DC
  Administered 2023-05-06 – 2023-05-07 (×3): 12.5 mg via ORAL
  Filled 2023-05-06 (×3): qty 1

## 2023-05-06 MED ORDER — CHLORHEXIDINE GLUCONATE CLOTH 2 % EX PADS
6.0000 | MEDICATED_PAD | Freq: Every day | CUTANEOUS | Status: DC
  Administered 2023-05-06 – 2023-05-25 (×18): 6 via TOPICAL

## 2023-05-06 NOTE — Addendum Note (Signed)
Encounter addended by: Dolores Patty, MD on: 05/06/2023 12:58 PM  Actions taken: Order list changed, Diagnosis association updated

## 2023-05-06 NOTE — Progress Notes (Signed)
PHARMACY - ANTICOAGULATION CONSULT NOTE  Pharmacy Consult for Heparin Infusion Indication: chest pain/ACS  Allergies  Allergen Reactions   Atorvastatin Hives, Itching and Other (See Comments)    Other reaction(s): Other (See Comments)   Simvastatin Hives, Itching and Other (See Comments)    Other reaction(s): Other (See Comments)    Patient Measurements: Height: 5\' 9"  (175.3 cm) Weight: 98.4 kg (216 lb 14.9 oz) IBW/kg (Calculated) : 70.7 Heparin Dosing Weight: 91.4 kg  Vital Signs: Temp: 98.9 F (37.2 C) (02/13 2154) Temp Source: Oral (02/13 2154) BP: 129/54 (02/13 2154) Pulse Rate: 62 (02/13 2154)  Labs: Recent Labs    05/05/23 1313 05/05/23 1712 05/06/23 0019 05/06/23 0906 05/06/23 1201 05/06/23 1434 05/06/23 2155  HGB 10.9*  --  10.6*  --   --   --   --   HCT 33.4*  --  31.5*  --   --   --   --   PLT 197  --  191  --   --   --   --   APTT  --   --   --   --   --  52* 161*  LABPROT  --   --   --   --   --  16.7*  --   INR  --   --   --   --   --  1.3*  --   HEPARINUNFRC  --   --   --   --   --  >1.10*  --   CREATININE 10.02*  --  10.54*  --   --   --   --   TROPONINIHS 95*   < > 97* 377* 680* 894*  --    < > = values in this interval not displayed.    Estimated Creatinine Clearance: 6 mL/min (A) (by C-G formula based on SCr of 10.54 mg/dL (H)).   Medical History: Past Medical History:  Diagnosis Date   AKI (acute kidney injury) (HCC) 07/06/2018   CAD (coronary artery disease)    a. Remote PCI/stenting to LAD w PTCA Diagnoal. RCA 60%; b.  2005/2008 Cardiolites w/ reportedly mild ischemia in Diag territory-->Med rx.   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 04/2019 Echo: EF 55-60%, no rwma, nl RV fxn, RVSP 55.24mmHg. Mod dil LA. Mild-mod MR. Mod dil PA.   CKD (chronic kidney disease), stage IV (HCC)    Diabetes (HCC)    GERD (gastroesophageal reflux disease)    History of MI  (myocardial infarction) 06/27/2014   HLD (hyperlipidemia)    HTN (hypertension)    PAH (pulmonary artery hypertension) (HCC)    a. 04/2019 Echo: RVSP 55.77mmHg.   Permanent atrial fibrillation (HCC)    a. CHA2DS2VASc = 5-->dose adjusted eliquis (age/creat).   Subdural hematoma (HCC)    a. 01/2021 in setting of fall s/p L frontotemporal craniotomy.    Medications:  Patient on apixaban- last dose 2/13 @ 0825  Assessment: Patient is a 83 year old male with a past medical history of ESRD-HD (MWF), HTN, HLD, DM, CAD with stent, dCHF, depression with anxiety, BPH, anemia, A-fib on Eliquis, and left eye blindness who presented to ED with concerns of cough, SOB, diarrhea, and fall. Troponin 95>105>89 initially on admission. Troponin now 377>680. Pharmacy has been consulted to initiate patient on a heparin infusion for ACS/STEMI.  Baseline aPTT, INR, and HL ordered.   No signs/symptoms of  bleeding noted in chart. Hgb 10.6. PLT 191.  Goal of Therapy:  Heparin level 0.3-0.7 units/ml aPTT 66-102 seconds Monitor platelets by anticoagulation protocol: Yes  Date/Time: aPTT / HL: Comment / Rate: 2/13@2155  161 / --  SUPRAtherapeutic@1050  units/hr   Plan:  Spoke to nurse, instructed to hold heparin for approximately 1 hour Will resume heparin infusion at decreased rate of 800 units/hr Check aPTT level in 8 hours and heparin level with AM labs, will use aPTT monitoring until heparin level is correlating.  Monitor CBC daily  Bettey Costa, PharmD Clinical Pharmacist  05/06/2023,11:28 PM

## 2023-05-06 NOTE — Progress Notes (Signed)
PT Cancellation Note  Patient Details Name: Kyle Roberson MRN: [id number] DOB: 1940/07/28   Cancelled Treatment:    Reason Eval/Treat Not Completed: Other (comment). Chart reviewed and evaluation attempted. RN staff in room giving complete bed change. Pt currently refuses therapy at this time. Will re-attempt next available date.   Yusuf Yu 05/06/2023, 9:30 AM Elizabeth Palau, PT, DPT, GCS [phone]

## 2023-05-06 NOTE — Progress Notes (Signed)
[facility] Progress Note  Patient: Kyle Roberson    WNU:[id number]  DOA: 05/05/2023     Date of Service: the patient was seen and examined on 05/06/2023  Chief Complaint  Patient presents with   Fall   Brief hospital course:  Tarance Balan is a 83 y.o. male with medical history significant of ESRD-HD (MWF), HTN, HLD, DM, CAD with stent, dCHF, depression with anxiety, BPH, anemia, A-fib on Eliquis, left eye blindness, who presents with cough, shortness of breath, diarrhea, fall.   Per pt and his wife (I called his wife by phone), patient has productive cough, sore throat, SOB, malaise for more than 5 days.  No chest pain.  No fever or chills.  Patient has diarrhea for more than 10 days, with 2- 3 times of watery diarrhea each day.  No nausea, vomiting or abdominal pain.  Patient fell this morning, no loss of consciousness. He patient states that he was going to the bathroom when he suddenly felt weak in his gave out and fell.  No unilateral numbness or tingling in extremities.  No facial droop or slurred speech.  Patient states that he has missed several dialysis. His last dialysis was on Monday.     Data reviewed independently and ED Course: pt was found to have positive COVID 19, WBC 5.6, troponin 95 --> 105, potassium 4.7, bicarbonate 18, creatinine 10.02, BUN 102.  CT head negative. Chest x-ray showed cardiomegaly with vascular congestion.  Patient is placed on telemetry bed for observation.     EKG: I have personally reviewed.  A-fib, QTc 461, low voltage, poor IV progression, anteroseptal infarction pattern.   Assessment and Plan:  # COVID-19 virus infection: No fever, no leukocytosis, no oxygen desaturation.   Chest x-ray showed vascular congestion, no infiltration.  -Bronchodilators and as needed Mucinex -Supportive care    # CAD (coronary artery disease) and Myocardial injury:  Troponin 894, but No chest pain and no significant EKG changes Continue to monitor on  telemetry Started aspirin 81 mg p.o. daily Discontinued Eliquis for now Started heparin IV infusion Cardiology consulted, recommended no ischemic workup at this time, follow as an outpatient for Myoview when recovered after COVID infection Follow 2D echocardiogram   # ESRD on dialysis -consulted Dr. Cherylann Ratel of renal Continue hemodialysis MWF schedule Hyperphosphatemia, started PhosLo   Fall at home, initial encounter: CT-head negative -Fall precaution -PT/OT   Paroxysmal atrial fibrillation: -Tele monitoring - held Eliquis for now Started heparin IV infusion due to elevated troponin level  Type II diabetes mellitus with renal manifestations:  Recent A1c 7.3, poorly controlled for patient taking glipizide at home -SSI   Essential hypertension: Patient's not taking medications.   Nonsudden V. tach episode 7 beats run at 630 on 2/13 reported 2/13 started metoprolol 12.5 mg p.o. twice daily -IV hydralazine as needed    Dyslipidemia -Crestor   Diarrhea: Possibly due to COVID infection -As needed Imodium -C. 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  Interventions:  Diet: Renal diet DVT Prophylaxis: Heparin IV infusion  Advance goals of care discussion: Full code  Family Communication: family was not present at bedside, at the time of interview.  The pt provided permission to discuss medical plan with the family. Opportunity was given to ask question and all questions were answered satisfactorily.   Disposition:  Pt is from home, admitted with COVID viral infection,  missed hemodialysis, elevated troponin on IV heparin infusion, which precludes a safe discharge. Discharge to SNF, when stable, may need few days to improve. PT and OT eval Follow TOC for placement   Subjective: No significant overnight events, patient denied any chest pain  or palpitations, still has some shortness of breath but unable to appreciate and complain.  Patient is feeling generalized weakness and tired, unable to get up and walk.  Physical Exam: General: NAD, lying comfortably Appear in no distress, affect appropriate Eyes: PERRLA ENT: Oral Mucosa Clear, moist  Neck: no JVD,  Cardiovascular: S1 and S2 Present, no Murmur,  Respiratory: good respiratory effort, Bilateral Air entry equal and Decreased, no Crackles, no wheezes Abdomen: Bowel Sound present, Soft and no tenderness,  Skin: no rashes Extremities: Chronic lymphedema and chronic skin changes, dry scaly skin  Neurologic: without any new focal findings Gait not checked due to patient safety concerns  Vitals:   05/05/23 2224 05/06/23 0324 05/06/23 0500 05/06/23 0816  BP: (!) 145/55 (!) 145/53  (!) 151/79  Pulse: 70 67  72  Resp: 20 18  17   Temp: 97.6 F (36.4 C) 98.6 F (37 C)  98.7 F (37.1 C)  TempSrc: Oral   Oral  SpO2: 100% 99%  97%  Weight:   98.4 kg   Height:        Intake/Output Summary (Last 24 hours) at 05/06/2023 1531 Last data filed at 05/05/2023 2300 Gross per 24 hour  Intake --  Output 50 ml  Net -50 ml   Filed Weights   05/05/23 1310 05/05/23 2217 05/06/23 0500  Weight: 95.3 kg 98.4 kg 98.4 kg    Data Reviewed: I have personally reviewed and interpreted daily labs, tele strips, imagings as discussed above. I reviewed all nursing notes, pharmacy notes, vitals, pertinent old records I have discussed plan of care as described above with RN and patient/family.  CBC: Recent Labs  Lab 05/05/23 1313 05/06/23 0019  WBC 5.6 6.4  HGB 10.9* 10.6*  HCT 33.4* 31.5*  MCV 97.7 96.0  PLT 197 191   Basic Metabolic Panel: Recent Labs  Lab 05/05/23 1313 05/06/23 0019 05/06/23 0906  NA 141 140  --   K 4.7 4.6  --   CL 101 103  --   CO2 18* 15*  --   GLUCOSE 203* 222*  --   BUN 102* 115*  --   CREATININE 10.02* 10.54*  --   CALCIUM 7.3* 7.0*  --   MG  --    --  1.9  PHOS  --   --  11.9*    Studies: DG Chest Port 1 View Result Date: 05/05/2023 CLINICAL DATA:  Fall diarrhea EXAM: PORTABLE CHEST 1 VIEW COMPARISON:  12/20/2022 FINDINGS: Right-sided central venous catheter tip at the SVC. Cardiomegaly with vascular congestion. No consolidation. Aortic atherosclerosis. No pneumothorax IMPRESSION: Cardiomegaly with vascular congestion. Electronically Signed   By: Jasmine Pang M.D.   On: 05/05/2023 20:38   CT Head Wo Contrast Result Date: 05/05/2023 CLINICAL DATA:  Syncope presyncope EXAM: CT HEAD WITHOUT CONTRAST TECHNIQUE: Contiguous axial images were obtained from the base of the skull through the vertex without intravenous contrast. RADIATION DOSE REDUCTION: This exam was performed according to the departmental dose-optimization program which includes automated exposure control, adjustment of the mA and/or kV according to patient size and/or use of iterative reconstruction technique. COMPARISON:  CT brain 04/08/2023 FINDINGS: Brain: No acute territorial infarction, hemorrhage or intracranial mass. Atrophy and chronic small vessel ischemic  changes of the white matter. Stable ventricle size. Vascular: No hyperdense vessels.  Carotid vascular calcification. Skull: Normal. Negative for fracture or focal lesion. Left craniotomy Sinuses/Orbits: Mucosal thickening in the ethmoid sinuses Other: None IMPRESSION: 1. No CT evidence for acute intracranial abnormality. 2. Atrophy and chronic small vessel ischemic changes of the white matter. Electronically Signed   By: Jasmine Pang M.D.   On: 05/05/2023 18:17    Scheduled Meds:  ascorbic acid  250 mg Oral Daily   aspirin  81 mg Oral Daily   calcium acetate  667 mg Oral TID WC   Chlorhexidine Gluconate Cloth  6 each Topical Daily   clotrimazole-betamethasone   Topical BID   dorzolamide-timolol  1 drop Both Eyes Daily   insulin aspart  0-5 Units Subcutaneous QHS   insulin aspart  0-9 Units Subcutaneous TID WC    metoprolol tartrate  12.5 mg Oral BID   multivitamin  1 tablet Oral QHS   neomycin-polymyxin b-dexamethasone  1 Application Right Eye TID   [START ON 05/07/2023] rosuvastatin  10 mg Oral Once per day on Monday Wednesday Friday   Continuous Infusions:  heparin 1,050 Units/hr (05/06/23 1433)   PRN Meds: acetaminophen, albuterol, dextromethorphan-guaiFENesin, hydrALAZINE, loperamide, nitroGLYCERIN, ondansetron (ZOFRAN) IV  Time spent: 55 minutes  Author: Gillis Santa. MD [facility] 05/06/2023 3:31 PM  To reach On-call, see care teams to locate the attending and reach out to them via [email or web address]. If 7PM-7AM, please contact night-coverage If you still have difficulty reaching the attending provider, please page the [facility] (Director on Call) for [facility] on amion for assistance.

## 2023-05-06 NOTE — Progress Notes (Addendum)
PHARMACY - ANTICOAGULATION CONSULT NOTE  Pharmacy Consult for Heparin Infusion Indication: chest pain/ACS  Allergies  Allergen Reactions   Atorvastatin Hives, Itching and Other (See Comments)    Other reaction(s): Other (See Comments)   Simvastatin Hives, Itching and Other (See Comments)    Other reaction(s): Other (See Comments)    Patient Measurements: Height: 5\' 9"  (175.3 cm) Weight: 98.4 kg (216 lb 14.9 oz) IBW/kg (Calculated) : 70.7 Heparin Dosing Weight: 91.4 kg  Vital Signs: Temp: 98.7 F (37.1 C) (02/13 0816) Temp Source: Oral (02/13 0816) BP: 151/79 (02/13 0816) Pulse Rate: 72 (02/13 0816)  Labs: Recent Labs    05/05/23 1313 05/05/23 1712 05/06/23 0019 05/06/23 0906 05/06/23 1201  HGB 10.9*  --  10.6*  --   --   HCT 33.4*  --  31.5*  --   --   PLT 197  --  191  --   --   CREATININE 10.02*  --  10.54*  --   --   TROPONINIHS 95*   < > 97* 377* 680*   < > = values in this interval not displayed.    Estimated Creatinine Clearance: 6 mL/min (A) (by C-G formula based on SCr of 10.54 mg/dL (H)).   Medical History: Past Medical History:  Diagnosis Date   AKI (acute kidney injury) (HCC) 07/06/2018   CAD (coronary artery disease)    a. Remote PCI/stenting to LAD w PTCA Diagnoal. RCA 60%; b.  2005/2008 Cardiolites w/ reportedly mild ischemia in Diag territory-->Med rx.   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 04/2019 Echo: EF 55-60%, no rwma, nl RV fxn, RVSP 55.70mmHg. Mod dil LA. Mild-mod MR. Mod dil PA.   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 04/2019 Echo: RVSP 55.49mmHg.   Permanent atrial fibrillation (HCC)    a. CHA2DS2VASc = 5-->dose adjusted eliquis (age/creat).    Subdural hematoma (HCC)    a. 01/2021 in setting of fall s/p L frontotemporal craniotomy.    Medications:  Patient on apixaban- last dose 2/13 @ 0825  Assessment: Patient is a 83 year old male with a past medical history of ESRD-HD (MWF), HTN, HLD, DM, CAD with stent, dCHF, depression with anxiety, BPH, anemia, A-fib on Eliquis, and left eye blindness who presented to ED with concerns of cough, SOB, diarrhea, and fall. Troponin 95>105>89 initially on admission. Troponin now 377>680. Pharmacy has been consulted to initiate patient on a heparin infusion for ACS/STEMI.  Baseline aPTT, INR, and HL ordered.   No signs/symptoms of bleeding noted in chart. Hgb 10.6. PLT 191.  Goal of Therapy:  Heparin level 0.3-0.7 units/ml aPTT 66-102 seconds Monitor platelets by anticoagulation protocol: Yes   Plan:  Will not bolus given recent apixaban use Start heparin infusion at a rate of 1050 units/hr Check aPTT level in 8 hours and heparin level with AM labs, will use aPTT monitoring until heparin level is correlating.  Monitor CBC daily  Merryl Hacker, PharmD Clinical Pharmacist  05/06/2023,1:45 PM

## 2023-05-06 NOTE — Progress Notes (Signed)
[facility]  ROUNDING NOTE   Subjective:   Kyle Roberson is a 83 y.o. male with diabetes mellitus type II, hypertension, coronary artery disease, chronic diastolic congestive heart failure, atrial fibrillation, pulmonary hypertension, and historyof subdural hematoma who is admitted for Weakness [R53.1] Fall, initial encounter [W19.XXXA] COVID-19 virus infection [U07.1] COVID-19 [U07.1]  Patient is known to our practice and receives outpatient dialysis at [facility] on a MWF, supervised by Dr Cherylann Ratel. He received his last treatment on Wednesday. He presents to the emergency department after sustaining a fall.  Also complains of cough, shortness of breath, and diarrhea.  Patient blames fall on weakness in his legs.  Complains of cough, shortness of breath and diarrhea for over a week.  Denies known fever or chills.  Labs on ED arrival significant for glucose 203, serum bicarb 18, BUN 102, creatinine 10.02 with GFR 5, calcium 7.3, albumin 3.2, BNP 320, troponin 95, and hemoglobin 10.9.  Patient found to be positive for COVID-19.  CT head negative for acute findings.  Chest x-ray shows vascular congestion.  We have been consulted to manage dialysis needs.   Objective:  Vital signs in last 24 hours:  Temp:  [97.6 F (36.4 C)-98.7 F (37.1 C)] 98.7 F (37.1 C) (02/13 0816) Pulse Rate:  [44-72] 72 (02/13 0816) Resp:  [16-20] 17 (02/13 0816) BP: (122-175)/(53-96) 151/79 (02/13 0816) SpO2:  [97 %-100 %] 97 % (02/13 0816) Weight:  [95.3 kg-98.4 kg] 98.4 kg (02/13 0500)  Weight change:  Filed Weights   05/05/23 1310 05/05/23 2217 05/06/23 0500  Weight: 95.3 kg 98.4 kg 98.4 kg    Intake/Output: I/O last 3 completed shifts: In: -  Out: 50 [Urine:50]   Intake/Output this shift:  No intake/output data recorded.  Physical Exam: General: NAD  Head: Normocephalic, atraumatic. Moist oral mucosal membranes  Eyes: Anicteric  Lungs:  Clear to auscultation, room air   Heart: Regular rate and rhythm  Abdomen:  Soft, nontender  Extremities: 1+ peripheral edema.  Neurologic: Alert and oriented, moving all four extremities  Skin: No lesions  Access: Right chest tunneled access    Basic Metabolic Panel: Recent Labs  Lab 05/05/23 1313 05/06/23 0019 05/06/23 0906  NA 141 140  --   K 4.7 4.6  --   CL 101 103  --   CO2 18* 15*  --   GLUCOSE 203* 222*  --   BUN 102* 115*  --   CREATININE 10.02* 10.54*  --   CALCIUM 7.3* 7.0*  --   MG  --   --  1.9  PHOS  --   --  11.9*    Liver Function Tests: Recent Labs  Lab 05/05/23 1313  AST 24  ALT 26  ALKPHOS 51  BILITOT 0.8  PROT 6.4*  ALBUMIN 3.2*   Recent Labs  Lab 05/05/23 1313  LIPASE 60*   No results for input(s): "AMMONIA" in the last 168 hours.  CBC: Recent Labs  Lab 05/05/23 1313 05/06/23 0019  WBC 5.6 6.4  HGB 10.9* 10.6*  HCT 33.4* 31.5*  MCV 97.7 96.0  PLT 197 191    Cardiac Enzymes: No results for input(s): "CKTOTAL", "CKMB", "CKMBINDEX", "TROPONINI" in the last 168 hours.  BNP: Invalid input(s): "POCBNP"  CBG: Recent Labs  Lab 05/06/23 0112 05/06/23 0811  GLUCAP 176* 106*    Microbiology: Results for orders placed or performed during the hospital encounter of 05/05/23  Resp panel by RT-PCR (RSV, Flu A&B, Covid) Anterior Nasal Swab  Status: Abnormal   Collection Time: 05/05/23  4:33 PM   Specimen: Anterior Nasal Swab  Result Value Ref Range Status   SARS Coronavirus 2 by RT PCR POSITIVE (A) NEGATIVE Final    Comment: (NOTE) SARS-CoV-2 target nucleic acids are DETECTED.  The SARS-CoV-2 RNA is generally detectable in upper respiratory specimens during the acute phase of infection. Positive results are indicative of the presence of the identified virus, but do not rule out bacterial infection or co-infection with other pathogens not detected by the test. Clinical correlation with patient history and other diagnostic information is necessary to  determine patient infection status. The expected result is Negative.  Fact Sheet for Patients: BloggerCourse.com  Fact Sheet for Healthcare Providers: SeriousBroker.it  This test is not yet approved or cleared by the [place] FDA and  has been authorized for detection and/or diagnosis of SARS-CoV-2 by FDA under an Emergency Use Authorization (EUA).  This EUA will remain in effect (meaning this test can be used) for the duration of  the COVID-19 declaration under Section 564(b)(1) of the A ct, 21 U.S.C. section 360bbb-3(b)(1), unless the authorization is terminated or revoked sooner.     Influenza A by PCR NEGATIVE NEGATIVE Final   Influenza B by PCR NEGATIVE NEGATIVE Final    Comment: (NOTE) The Xpert Xpress SARS-CoV-2/FLU/RSV plus assay is intended as an aid in the diagnosis of influenza from Nasopharyngeal swab specimens and should not be used as a sole basis for treatment. Nasal washings and aspirates are unacceptable for Xpert Xpress SARS-CoV-2/FLU/RSV testing.  Fact Sheet for Patients: BloggerCourse.com  Fact Sheet for Healthcare Providers: SeriousBroker.it  This test is not yet approved or cleared by the [place] FDA and has been authorized for detection and/or diagnosis of SARS-CoV-2 by FDA under an Emergency Use Authorization (EUA). This EUA will remain in effect (meaning this test can be used) for the duration of the COVID-19 declaration under Section 564(b)(1) of the Act, 21 U.S.C. section 360bbb-3(b)(1), unless the authorization is terminated or revoked.     Resp Syncytial Virus by PCR NEGATIVE NEGATIVE Final    Comment: (NOTE) Fact Sheet for Patients: BloggerCourse.com  Fact Sheet for Healthcare Providers: SeriousBroker.it  This test is not yet approved or cleared by the [place] FDA and has  been authorized for detection and/or diagnosis of SARS-CoV-2 by FDA under an Emergency Use Authorization (EUA). This EUA will remain in effect (meaning this test can be used) for the duration of the COVID-19 declaration under Section 564(b)(1) of the Act, 21 U.S.C. section 360bbb-3(b)(1), unless the authorization is terminated or revoked.  Performed at [facility], [address] [place], [place] [address]   C Difficile Quick Screen w PCR reflex     Status: None   Collection Time: 05/05/23  7:35 PM   Specimen: STOOL  Result Value Ref Range Status   C Diff antigen NEGATIVE NEGATIVE Final   C Diff toxin NEGATIVE NEGATIVE Final   C Diff interpretation No C. difficile detected.  Final    Comment: Performed at [facility], [address] [place], [place] [address]    Coagulation Studies: No results for input(s): "LABPROT", "INR" in the last 72 hours.  Urinalysis: No results for input(s): "COLORURINE", "LABSPEC", "PHURINE", "GLUCOSEU", "HGBUR", "BILIRUBINUR", "KETONESUR", "PROTEINUR", "UROBILINOGEN", "NITRITE", "LEUKOCYTESUR" in the last 72 hours.  Invalid input(s): "APPERANCEUR"    Imaging: DG Chest Port 1 View Result Date: 05/05/2023 CLINICAL DATA:  Fall diarrhea EXAM: PORTABLE CHEST 1 VIEW COMPARISON:  12/20/2022  FINDINGS: Right-sided central venous catheter tip at the SVC. Cardiomegaly with vascular congestion. No consolidation. Aortic atherosclerosis. No pneumothorax IMPRESSION: Cardiomegaly with vascular congestion. Electronically Signed   By: Jasmine Pang M.D.   On: 05/05/2023 20:38   CT Head Wo Contrast Result Date: 05/05/2023 CLINICAL DATA:  Syncope presyncope EXAM: CT HEAD WITHOUT CONTRAST TECHNIQUE: Contiguous axial images were obtained from the base of the skull through the vertex without intravenous contrast. RADIATION DOSE REDUCTION: This exam was performed according to the departmental dose-optimization program which includes automated exposure  control, adjustment of the mA and/or kV according to patient size and/or use of iterative reconstruction technique. COMPARISON:  CT brain 04/08/2023 FINDINGS: Brain: No acute territorial infarction, hemorrhage or intracranial mass. Atrophy and chronic small vessel ischemic changes of the white matter. Stable ventricle size. Vascular: No hyperdense vessels.  Carotid vascular calcification. Skull: Normal. Negative for fracture or focal lesion. Left craniotomy Sinuses/Orbits: Mucosal thickening in the ethmoid sinuses Other: None IMPRESSION: 1. No CT evidence for acute intracranial abnormality. 2. Atrophy and chronic small vessel ischemic changes of the white matter. Electronically Signed   By: Jasmine Pang M.D.   On: 05/05/2023 18:17     Medications:     apixaban  2.5 mg Oral BID   ascorbic acid  250 mg Oral Daily   Chlorhexidine Gluconate Cloth  6 each Topical Daily   dorzolamide-timolol  1 drop Both Eyes Daily   insulin aspart  0-5 Units Subcutaneous QHS   insulin aspart  0-9 Units Subcutaneous TID WC   metoprolol tartrate  12.5 mg Oral BID   multivitamin  1 tablet Oral QHS   neomycin-polymyxin b-dexamethasone  1 Application Right Eye TID   [START ON 05/07/2023] rosuvastatin  10 mg Oral Once per day on Monday Wednesday Friday   acetaminophen, albuterol, dextromethorphan-guaiFENesin, hydrALAZINE, loperamide, nitroGLYCERIN, ondansetron (ZOFRAN) IV  Assessment/ Plan:  [name] is a 83 y.o.  male with diabetes mellitus type II, hypertension, coronary artery disease, chronic diastolic congestive heart failure, atrial fibrillation, pulmonary hypertension, and historyof subdural hematoma who is admitted for Weakness [R53.1] Fall, initial encounter [W19.XXXA] COVID-19 virus infection [U07.1] COVID-19 [U07.1]   COVID-19, found positive this admission.  Receiving supportive care   2.  End-stage renal disease on hemodialysis.  Last treatment completed on Wednesday.  Will schedule next  treatment for Friday, if remains admitted.  3. Anemia of chronic kidney disease  Lab Results  Component Value Date   HGB 10.6 (L) 05/06/2023    Hemoglobin within optimal range.  No need for ESA's at this time.  4. Secondary Hyperparathyroidism: with outpatient labs: None available  Lab Results  Component Value Date   PTH 344 (H) 01/07/2023   CALCIUM 7.0 (L) 05/06/2023   CAION 1.14 (L) 12/02/2022   PHOS 11.9 (H) 05/06/2023    Will continue to monitor bone minerals during this admission.  Corrected calcium 7.9.  Phosphorus remains elevated.  Patient prescribed calcium acetate outpatient.  Will resume this during this admission.   LOS: 0 Kennesha Brewbaker 2/13/202511:38 AM

## 2023-05-06 NOTE — Discharge Planning (Signed)
ESTABLISHED HEMODIALYSIS Outpatient Facility [facility] [address] [place], [place] [address] [phone]  Schedule: MWF 11:45am  Dimas Chyle Dialysis Coordinator II  [facility] Cell: [phone] eFax: [phone] [email or web address]

## 2023-05-06 NOTE — Consult Note (Signed)
Cardiology Consultation   Patient ID: Kyle Roberson MRN: [id number]; DOB: Nov 19, 1940  Admit date: 05/05/2023 Date of Consult: 05/06/2023  PCP:  [name], MD   [facility] Providers Cardiologist:  Julien Nordmann, MD  Advanced heart failure cardiologist: Dr. Gala Romney Physician requesting consult Dr. Lucianne Muss Reason for consult: Elevated troponin, COVID : Patient Profile:   Kyle Roberson is a 83 y.o. 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, chronic diastolic CHF, pulmonary hypertension followed in advanced heart failure clinic presenting with weakness, fall, COVID, elevated troponin  History of Present Illness:   Kyle Roberson was recently seen by advanced heart failure clinic April 29, 2023, stress test ordered for preop risk stratification before PD catheter placement Reported still making urine, was on Lasix 80 mg on nondialysis days   reports having diarrhea over the past week, suffered a fall May 05, 2023, legs gave out Denies head injury, " only hurt my butt and my pride" Denies any near-syncope or syncope symptoms Denies significant chest discomfort concerning for angina He does have chest congestion, denies worsening leg swelling or abdominal distention concerning for fluid retention Had dialysis yesterday May 05, 2023 Prior to the past week had been ambulating well around the house 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 Remote PCI/stenting to LAD w PTCA Diagnoal. RCA 60%; b.  2005/2008 Cardiolites w/ reportedly mild ischemia in Diag territory-->Med rx.   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 04/2019 Echo: EF 55-60%, no rwma, nl RV fxn, RVSP 55.73mmHg. Mod dil LA. Mild-mod MR. Mod dil PA.   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 04/2019 Echo: RVSP 55.67mmHg.   Permanent atrial fibrillation (HCC)    a. CHA2DS2VASc = 5-->dose adjusted eliquis (age/creat).   Subdural hematoma (HCC)    a. 01/2021 in setting of fall s/p L frontotemporal craniotomy.    Past Surgical History:  Procedure Laterality Date   BLEPHAROPLASTY     CARDIAC CATHETERIZATION     CORONARY STENT INTERVENTION     CRANIOTOMY Left 02/04/2021   Procedure: CRANIOTOMY FOR LEFT SUBDURAL  HEMATOMA EVACUATION;  Surgeon: Lucy Chris, MD;  Location: [facility] ORS;  Service: Neurosurgery;  Laterality: Left;   CYSTOSCOPY     DIALYSIS/PERMA CATHETER INSERTION N/A 12/14/2022   Procedure: DIALYSIS/PERMA CATHETER INSERTION;  Surgeon: Annice Needy, MD;  Location: [facility] INVASIVE CV LAB;  Service: Cardiovascular;  Laterality: N/A;   DIALYSIS/PERMA CATHETER REPAIR N/A 12/16/2022   Procedure: DIALYSIS/PERMA CATHETER REPAIR;  Surgeon: Annice Needy, MD;  Location: [facility] INVASIVE CV LAB;  Service: Cardiovascular;  Laterality: N/A;   HERNIA REPAIR     RIGHT HEART CATH N/A 12/02/2022   Procedure:  RIGHT HEART CATH;  Surgeon: Laurey Morale, MD;  Location: [facility] INVASIVE CV LAB;  Service: Cardiovascular;  Laterality: N/A;     Home Medications:  Prior to Admission medications   Medication Sig Start Date End Date Taking? Authorizing Provider  acetaminophen (TYLENOL) 325  MG tablet Take 1-2 tablets (325-650 mg total) by mouth every 4 (four) hours as needed for mild pain (pain score 1-3). 01/18/23  Yes Setzer, Lynnell Jude, PA-C  apixaban (ELIQUIS) 2.5 MG TABS tablet Take 1 tablet (2.5 mg total) by mouth 2 (two) times daily. 11/26/22  Yes Bensimhon, Bevelyn Buckles, MD  Ascorbic Acid (VITAMIN C PO) Take 1 tablet by mouth in the morning.   Yes [provider]  Cholecalciferol (VITAMIN D3) 1.25 MG (50000 UT) CAPS Take 1 tablet by mouth in the morning.   Yes [provider]  dorzolamide-timolol (COSOPT) 2-0.5 % ophthalmic solution Place 1 drop into both eyes daily. 04/13/22  Yes [provider]  furosemide (LASIX) 80 MG tablet Take 80 mg by mouth 4 (four) times a week.   Yes [provider]  hydrocerin (EUCERIN) CREA Apply 1 Application topically 3 (three) times daily. 01/18/23  Yes Setzer, Lynnell Jude, PA-C  multivitamin (RENA-VIT) TABS tablet Take 1 tablet by mouth at bedtime. 01/18/23  Yes Setzer, Lynnell Jude, PA-C  neomycin-polymyxin b-dexamethasone (MAXITROL) 3.5-10000-0.1 OINT Place 1 Application into the right eye 3 (three) times daily. 01/18/23  Yes Setzer, Lynnell Jude, PA-C  nitroGLYCERIN (NITROLINGUAL) 0.4 MG/SPRAY spray Place 1 spray under the tongue as directed. 04/29/23 05/17/24 Yes Bensimhon, Bevelyn Buckles, MD  Omega-3 Fatty Acids (FISH OIL PO) Take 1 capsule by mouth in the morning.   Yes [provider]  rosuvastatin (CRESTOR) 10 MG tablet Take 1 tablet (10 mg total) by mouth 3 (three) times a week. 04/30/23  Yes Bensimhon, Bevelyn Buckles, MD  Calcium Acetate 667 MG TABS Take 1 tablet by mouth in the morning, at noon, and at bedtime.    [provider]    Inpatient Medications: Scheduled Meds:  ascorbic acid  250 mg Oral Daily   aspirin  81 mg Oral Daily   calcium acetate  667 mg Oral TID WC   Chlorhexidine Gluconate Cloth  6 each Topical Daily   clotrimazole-betamethasone   Topical BID   dorzolamide-timolol  1 drop Both Eyes Daily    insulin aspart  0-5 Units Subcutaneous QHS   insulin aspart  0-9 Units Subcutaneous TID WC   metoprolol tartrate  12.5 mg Oral BID   multivitamin  1 tablet Oral QHS   neomycin-polymyxin b-dexamethasone  1 Application Right Eye TID   [START ON 05/07/2023] rosuvastatin  10 mg Oral Once per day on Monday Wednesday Friday   Continuous Infusions:  heparin     PRN Meds: acetaminophen, albuterol, dextromethorphan-guaiFENesin, hydrALAZINE, loperamide, nitroGLYCERIN, ondansetron (ZOFRAN) IV  Allergies:    Allergies  Allergen Reactions   Atorvastatin Hives, Itching and Other (See Comments)    Other reaction(s): Other (See Comments)   Simvastatin Hives, Itching and Other (See Comments)    Other reaction(s): Other (See Comments)    Social History:   Social History   Socioeconomic History   Marital status: Married    Spouse name: Not on file   Number of children: Not on file   Years of education: Not on file   Highest education level: Not on file  Occupational History   Not on file  Tobacco Use   Smoking status: Former   Smokeless tobacco:  Never  Vaping Use   Vaping status: Never Used  Substance and Sexual Activity   Alcohol use: Not Currently    Alcohol/week: 2.0 standard drinks of alcohol    Types: 2 Glasses of wine per week   Drug use: Not Currently   Sexual activity: Not on file  Other Topics Concern   Not on file  Social History Narrative   Lives at home with wife   Social Drivers of Health   Financial Resource Strain: Not on file  Food Insecurity: No Food Insecurity (12/01/2022)   Hunger Vital Sign    Worried About Running Out of Food in the Last Year: Never true    Ran Out of Food in the Last Year: Never true  Transportation Needs: No Transportation Needs (12/01/2022)   PRAPARE - Administrator, Civil Service (Medical): No    Lack of Transportation (Non-Medical): No  Physical Activity: Not on file  Stress: Not on file  Social Connections: Not on file   Intimate Partner Violence: Not At Risk (12/01/2022)   Humiliation, Afraid, Rape, and Kick questionnaire    Fear of Current or Ex-Partner: No    Emotionally Abused: No    Physically Abused: No    Sexually Abused: No    Family History:    Family History  Problem Relation Age of Onset   Pancreatic cancer Mother    CAD Father    Diabetes Brother      ROS:  Please see the history of present illness.  Review of Systems  Constitutional: Negative.        Weakness  HENT: Negative.    Respiratory:  Positive for cough.   Cardiovascular: Negative.   Gastrointestinal: Negative.   Musculoskeletal: Negative.   Neurological: Negative.   Psychiatric/Behavioral: Negative.    All other systems reviewed and are negative.   Physical Exam/Data:   Vitals:   05/05/23 2224 05/06/23 0324 05/06/23 0500 05/06/23 0816  BP: (!) 145/55 (!) 145/53  (!) 151/79  Pulse: 70 67  72  Resp: 20 18  17   Temp: 97.6 F (36.4 C) 98.6 F (37 C)  98.7 F (37.1 C)  TempSrc: Oral   Oral  SpO2: 100% 99%  97%  Weight:   98.4 kg   Height:        Intake/Output Summary (Last 24 hours) at 05/06/2023 1429 Last data filed at 05/05/2023 2300 Gross per 24 hour  Intake --  Output 50 ml  Net -50 ml      05/06/2023    5:00 AM 05/05/2023   10:17 PM 05/05/2023    1:10 PM  Last 3 Weights  Weight (lbs) 216 lb 14.9 oz 216 lb 14.9 oz 210 lb  Weight (kg) 98.4 kg 98.4 kg 95.255 kg     Body mass index is 32.04 kg/m.  General:  Well nourished, well developed, in no acute distress HEENT: normal Neck: no JVD Vascular: No carotid bruits; Distal pulses 2+ bilaterally Cardiac:  normal S1, S2; RRR; no murmur  Lungs:  clear to auscultation bilaterally, no wheezing, rhonchi or rales  Abd: soft, nontender, no hepatomegaly  Ext: no edema Musculoskeletal:  No deformities, BUE and BLE strength normal and equal Skin: warm and dry  Neuro:  CNs 2-12 intact, no focal abnormalities noted Psych:  Normal affect   EKG:  The EKG was  personally reviewed and demonstrates:   Atrial fibrillation with ventricular rate 56 bpm interventricular conduction delay  Telemetry:  Telemetry was personally reviewed and  demonstrates: Atrial fibrillation rate 60 bpm  Relevant CV Studies: Echocardiogram pending  Laboratory Data:  High Sensitivity Troponin:   Recent Labs  Lab 05/05/23 1712 05/05/23 2204 05/06/23 0019 05/06/23 0906 05/06/23 1201  TROPONINIHS 105* 89* 97* 377* 680*     Chemistry Recent Labs  Lab 05/05/23 1313 05/06/23 0019 05/06/23 0906  NA 141 140  --   K 4.7 4.6  --   CL 101 103  --   CO2 18* 15*  --   GLUCOSE 203* 222*  --   BUN 102* 115*  --   CREATININE 10.02* 10.54*  --   CALCIUM 7.3* 7.0*  --   MG  --   --  1.9  GFRNONAA 5* 4*  --   ANIONGAP 22* 22*  --     Recent Labs  Lab 05/05/23 1313  PROT 6.4*  ALBUMIN 3.2*  AST 24  ALT 26  ALKPHOS 51  BILITOT 0.8   Lipids  Recent Labs  Lab 05/06/23 0019  CHOL 139  TRIG 150*  HDL 32*  LDLCALC 77  CHOLHDL 4.3    Hematology Recent Labs  Lab 05/05/23 1313 05/06/23 0019  WBC 5.6 6.4  RBC 3.42* 3.28*  HGB 10.9* 10.6*  HCT 33.4* 31.5*  MCV 97.7 96.0  MCH 31.9 32.3  MCHC 32.6 33.7  RDW 14.4 14.3  PLT 197 191   Thyroid No results for input(s): "TSH", "FREET4" in the last 168 hours.  BNP Recent Labs  Lab 05/05/23 1313  BNP 320.0*    DDimer No results for input(s): "DDIMER" in the last 168 hours.   Radiology/Studies:  DG Chest Port 1 View Result Date: 05/05/2023 CLINICAL DATA:  Fall diarrhea EXAM: PORTABLE CHEST 1 VIEW COMPARISON:  12/20/2022 FINDINGS: Right-sided central venous catheter tip at the SVC. Cardiomegaly with vascular congestion. No consolidation. Aortic atherosclerosis. No pneumothorax IMPRESSION: Cardiomegaly with vascular congestion. Electronically Signed   By: Jasmine Pang M.D.   On: 05/05/2023 20:38   CT Head Wo Contrast Result Date: 05/05/2023 CLINICAL DATA:  Syncope presyncope EXAM: CT HEAD WITHOUT CONTRAST  TECHNIQUE: Contiguous axial images were obtained from the base of the skull through the vertex without intravenous contrast. RADIATION DOSE REDUCTION: This exam was performed according to the departmental dose-optimization program which includes automated exposure control, adjustment of the mA and/or kV according to patient size and/or use of iterative reconstruction technique. COMPARISON:  CT brain 04/08/2023 FINDINGS: Brain: No acute territorial infarction, hemorrhage or intracranial mass. Atrophy and chronic small vessel ischemic changes of the white matter. Stable ventricle size. Vascular: No hyperdense vessels.  Carotid vascular calcification. Skull: Normal. Negative for fracture or focal lesion. Left craniotomy Sinuses/Orbits: Mucosal thickening in the ethmoid sinuses Other: None IMPRESSION: 1. No CT evidence for acute intracranial abnormality. 2. Atrophy and chronic small vessel ischemic changes of the white matter. Electronically Signed   By: Jasmine Pang M.D.   On: 05/05/2023 18:17     Assessment and Plan:   Weakness/diarrhea/fall Diagnosed with COVID Loose chest congestion, does not appear to be in respiratory distress Chest x-ray with vascular congestion, no pneumonia Currently not on oxygen Supportive care at this time  2.  Elevated troponin/demand ischemia Known coronary artery disease and prior stenting Reports no symptoms on arrival concerning for ischemia or angina Denies shortness of breath or chest pain Troponin checked 6 times, last 2 measurements trending upwards No significant EKG changes -Less likely non-STEMI, more likely demand ischemia in the setting of end-stage renal disease, COVID, hypertension,  pulmonary hypertension, anemia -No plan for ischemic workup at this time -Continue with plan for outpatient Myoview as ordered by advanced heart failure clinic after he has recovered from his COVID infection -Repeat baseline echocardiogram ordered  Diabetes type 2 A1c  7.5 Does not appear to be on medications as outpatient  Coronary artery disease with stable angina On Eliquis in place of aspirin, continue Crestor  Chronic diastolic CHF, pulmonary hypertension in the setting of end-stage renal disease On Lasix 80 on nondialysis days Dialysis 3 days a week Being scheduled for PD catheter  COVID infection No pneumonia on chest x-ray Has loose chest congestion Possibly contributing to diarrhea over the past week, leg weakness, general malaise Denies sick contacts at home Overall appears relatively stable, no distress   For questions or updates, please contact [facility] Please consult [email or web address] for contact info under    Signed, Julien Nordmann, MD  05/06/2023 2:29 PM

## 2023-05-06 NOTE — Progress Notes (Signed)
Provider notified via secure chat  critical lab result Aptt 161.

## 2023-05-06 NOTE — Progress Notes (Signed)
OT Cancellation Note  Patient Details Name: Kyle Roberson MRN: [id number] DOB: 1940/09/08   Cancelled Treatment:    Reason Eval/Treat Not Completed: Other (comment) (per discussion with nurse, pt is still intermittently agitated, troponin is trending upwards. Pt is not medically ready for OT at this time. We will follow up as able/approrpiate.) Oleta Mouse, OTD OTR/L  05/06/23, 2:50 PM

## 2023-05-06 NOTE — Plan of Care (Signed)
  Problem: Education: Goal: Knowledge of risk factors and measures for prevention of condition will improve Outcome: Progressing   Problem: Respiratory: Goal: Will maintain a patent airway Outcome: Progressing   Problem: Education: Goal: Knowledge of General Education information will improve Description: Including pain rating scale, medication(s)/side effects and non-pharmacologic comfort measures Outcome: Progressing   Problem: Clinical Measurements: Goal: Respiratory complications will improve Outcome: Progressing   Problem: Pain Managment: Goal: General experience of comfort will improve and/or be controlled Outcome: Progressing   Problem: Respiratory: Goal: Complications related to the disease process, condition or treatment will be avoided or minimized Outcome: Not Progressing   Problem: Clinical Measurements: Goal: Will remain free from infection Outcome: Not Progressing Goal: Cardiovascular complication will be avoided Outcome: Not Progressing   Problem: Activity: Goal: Risk for activity intolerance will decrease Outcome: Not Progressing   Problem: Skin Integrity: Goal: Risk for impaired skin integrity will decrease Outcome: Not Progressing

## 2023-05-07 ENCOUNTER — Observation Stay (HOSPITAL_COMMUNITY)
Admit: 2023-05-07 | Discharge: 2023-05-07 | Disposition: A | Discharge status: Home / Self Care | Payer: Medicare Other | Attending: Cardiovascular Disease | Admitting: Cardiovascular Disease

## 2023-05-07 DIAGNOSIS — N400 Enlarged prostate without lower urinary tract symptoms: Secondary | ICD-10-CM | POA: Diagnosis present

## 2023-05-07 DIAGNOSIS — E785 Hyperlipidemia, unspecified: Secondary | ICD-10-CM | POA: Diagnosis present

## 2023-05-07 DIAGNOSIS — I5A Non-ischemic myocardial injury (non-traumatic): Secondary | ICD-10-CM | POA: Diagnosis not present

## 2023-05-07 DIAGNOSIS — F4329 Adjustment disorder with other symptoms: Secondary | ICD-10-CM | POA: Diagnosis not present

## 2023-05-07 DIAGNOSIS — I4821 Permanent atrial fibrillation: Secondary | ICD-10-CM | POA: Diagnosis present

## 2023-05-07 DIAGNOSIS — Z794 Long term (current) use of insulin: Secondary | ICD-10-CM | POA: Diagnosis not present

## 2023-05-07 DIAGNOSIS — I25118 Atherosclerotic heart disease of native coronary 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: Secondary | ICD-10-CM | POA: Diagnosis not present

## 2023-05-07 DIAGNOSIS — I5032 Chronic diastolic (congestive) heart failure: Secondary | ICD-10-CM | POA: Diagnosis present

## 2023-05-07 DIAGNOSIS — R410 Disorientation, unspecified: Secondary | ICD-10-CM | POA: Diagnosis not present

## 2023-05-07 DIAGNOSIS — W1839XA Other fall on same level, initial encounter: Secondary | ICD-10-CM | POA: Diagnosis present

## 2023-05-07 DIAGNOSIS — E1165 Type 2 diabetes mellitus with hyperglycemia: Secondary | ICD-10-CM | POA: Diagnosis present

## 2023-05-07 DIAGNOSIS — I2510 Atherosclerotic heart disease of native coronary artery without angina pectoris: Secondary | ICD-10-CM | POA: Diagnosis present

## 2023-05-07 DIAGNOSIS — Y92009 Unspecified place in unspecified non-institutional (private) residence as the place of occurrence of the external cause: Secondary | ICD-10-CM | POA: Diagnosis not present

## 2023-05-07 DIAGNOSIS — I4891 Unspecified atrial fibrillation: Secondary | ICD-10-CM | POA: Diagnosis not present

## 2023-05-07 DIAGNOSIS — Z515 Encounter for palliative care: Secondary | ICD-10-CM | POA: Diagnosis not present

## 2023-05-07 DIAGNOSIS — F411 Generalized anxiety disorder: Secondary | ICD-10-CM | POA: Diagnosis present

## 2023-05-07 DIAGNOSIS — I132 Hypertensive heart and chronic kidney disease with heart failure and with stage 5 chronic kidney disease, or end stage renal disease: Secondary | ICD-10-CM | POA: Diagnosis present

## 2023-05-07 DIAGNOSIS — Z87891 Personal history of nicotine dependence: Secondary | ICD-10-CM | POA: Diagnosis not present

## 2023-05-07 DIAGNOSIS — N186 End stage renal disease: Secondary | ICD-10-CM | POA: Diagnosis present

## 2023-05-07 DIAGNOSIS — Y9301 Activity, walking, marching and hiking: Secondary | ICD-10-CM | POA: Diagnosis present

## 2023-05-07 DIAGNOSIS — Z992 Dependence on renal dialysis: Secondary | ICD-10-CM | POA: Diagnosis not present

## 2023-05-07 DIAGNOSIS — Z833 Family history of diabetes mellitus: Secondary | ICD-10-CM | POA: Diagnosis not present

## 2023-05-07 DIAGNOSIS — Z7901 Long term (current) use of anticoagulants: Secondary | ICD-10-CM | POA: Diagnosis not present

## 2023-05-07 DIAGNOSIS — E669 Obesity, unspecified: Secondary | ICD-10-CM | POA: Diagnosis present

## 2023-05-07 DIAGNOSIS — E1122 Type 2 diabetes mellitus with diabetic chronic kidney disease: Secondary | ICD-10-CM | POA: Diagnosis present

## 2023-05-07 DIAGNOSIS — I480 Paroxysmal atrial fibrillation: Secondary | ICD-10-CM | POA: Diagnosis not present

## 2023-05-07 DIAGNOSIS — W19XXXA Unspecified fall, initial encounter: Secondary | ICD-10-CM | POA: Diagnosis not present

## 2023-05-07 DIAGNOSIS — Z7982 Long term (current) use of aspirin: Secondary | ICD-10-CM | POA: Diagnosis not present

## 2023-05-07 DIAGNOSIS — N2581 Secondary hyperparathyroidism of renal origin: Secondary | ICD-10-CM | POA: Diagnosis present

## 2023-05-07 DIAGNOSIS — Z6832 Body mass index (BMI) 32.0-32.9, adult: Secondary | ICD-10-CM | POA: Diagnosis not present

## 2023-05-07 DIAGNOSIS — N19 Unspecified kidney failure: Secondary | ICD-10-CM | POA: Diagnosis present

## 2023-05-07 DIAGNOSIS — U071 COVID-19: Secondary | ICD-10-CM | POA: Diagnosis present

## 2023-05-07 DIAGNOSIS — R531 Weakness: Secondary | ICD-10-CM | POA: Diagnosis present

## 2023-05-07 DIAGNOSIS — I2489 Other forms of acute ischemic heart disease: Secondary | ICD-10-CM | POA: Diagnosis not present

## 2023-05-07 DIAGNOSIS — Z8249 Family history of ischemic heart disease and other diseases of the circulatory system: Secondary | ICD-10-CM | POA: Diagnosis not present

## 2023-05-07 DIAGNOSIS — D631 Anemia in chronic kidney disease: Secondary | ICD-10-CM | POA: Diagnosis present

## 2023-05-07 LAB — PHOSPHORUS: Phosphorus: >30 mg/dL — ABNORMAL HIGH (ref 2.5–4.6)

## 2023-05-07 LAB — CBC
HCT: 27.2 % — ABNORMAL LOW (ref 39.0–52.0)
Hemoglobin: 9.2 g/dL — ABNORMAL LOW (ref 13.0–17.0)
MCH: 32.3 pg (ref 26.0–34.0)
MCHC: 33.8 g/dL (ref 30.0–36.0)
MCV: 95.4 fL (ref 80.0–100.0)
Platelets: 154 10*3/uL (ref 150–400)
RBC: 2.85 MIL/uL — ABNORMAL LOW (ref 4.22–5.81)
RDW: 14.2 % (ref 11.5–15.5)
WBC: 8.9 10*3/uL (ref 4.0–10.5)
nRBC: 0 % (ref 0.0–0.2)

## 2023-05-07 LAB — GLUCOSE, CAPILLARY
Glucose-Capillary: 81 mg/dL (ref 70–99)
Glucose-Capillary: 121 mg/dL — ABNORMAL HIGH (ref 70–99)
Glucose-Capillary: 112 mg/dL — ABNORMAL HIGH (ref 70–99)
Glucose-Capillary: 58 mg/dL — ABNORMAL LOW (ref 70–99)
Glucose-Capillary: 113 mg/dL — ABNORMAL HIGH (ref 70–99)
Glucose-Capillary: 68 mg/dL — ABNORMAL LOW (ref 70–99)

## 2023-05-07 LAB — BASIC METABOLIC PANEL
Anion gap: 23 — ABNORMAL HIGH (ref 5–15)
BUN: 137 mg/dL — ABNORMAL HIGH (ref 8–23)
CO2: 15 mmol/L — ABNORMAL LOW (ref 22–32)
Calcium: 7 mg/dL — ABNORMAL LOW (ref 8.9–10.3)
Chloride: 103 mmol/L (ref 98–111)
Creatinine, Ser: 11.98 mg/dL — ABNORMAL HIGH (ref 0.61–1.24)
GFR, Estimated: 4 mL/min — ABNORMAL LOW (ref >60)
Glucose, Bld: 100 mg/dL — ABNORMAL HIGH (ref 70–99)
Potassium: 5.4 mmol/L — ABNORMAL HIGH (ref 3.5–5.1)
Sodium: 141 mmol/L (ref 135–145)

## 2023-05-07 LAB — HEMOGLOBIN AND HEMATOCRIT, BLOOD
HCT: 29.3 % — ABNORMAL LOW (ref 39.0–52.0)
HCT: 28.6 % — ABNORMAL LOW (ref 39.0–52.0)
Hemoglobin: 9.4 g/dL — ABNORMAL LOW (ref 13.0–17.0)
Hemoglobin: 9.7 g/dL — ABNORMAL LOW (ref 13.0–17.0)

## 2023-05-07 LAB — ECHOCARDIOGRAM COMPLETE
AR max vel: 1.15 cm2
AV Area VTI: 1.23 cm2
AV Area mean vel: 1.14 cm2
AV Mean grad: 6 mm[Hg]
AV Peak grad: 13.9 mm[Hg]
Ao pk vel: 1.87 m/s
Area-P 1/2: 3.74 cm2
Height: 69 in
MV VTI: 1 cm2
P 1/2 time: 454 ms
S' Lateral: 3.5 cm
Weight: 3470.92 [oz_av]

## 2023-05-07 LAB — MAGNESIUM: Magnesium: 1.9 mg/dL (ref 1.7–2.4)

## 2023-05-07 LAB — TROPONIN I (HIGH SENSITIVITY): Troponin I (High Sensitivity): 728 ng/L (ref <18)

## 2023-05-07 LAB — HEPARIN LEVEL (UNFRACTIONATED): Heparin Unfractionated: >1.1 [IU]/mL — ABNORMAL HIGH (ref 0.30–0.70)

## 2023-05-07 LAB — OCCULT BLOOD X 1 CARD TO LAB, STOOL: Fecal Occult Bld: POSITIVE — AB

## 2023-05-07 LAB — APTT: aPTT: 98 s — ABNORMAL HIGH (ref 24–36)

## 2023-05-07 MED ORDER — PANTOPRAZOLE SODIUM 40 MG IV SOLR
40.0000 mg | Freq: Two times a day (BID) | INTRAVENOUS | Status: AC
  Administered 2023-05-07 – 2023-05-13 (×14): 40 mg via INTRAVENOUS
  Filled 2023-05-07 (×14): qty 10

## 2023-05-07 MED ORDER — GLUCOSE 40 % PO GEL
1.0000 | ORAL | Status: AC
  Administered 2023-05-07: 31 g via ORAL
  Filled 2023-05-07: qty 1

## 2023-05-07 MED ORDER — HEPARIN (PORCINE) 25000 UT/250ML-% IV SOLN: 800.0000 [IU]/h | INTRAVENOUS | Status: DC

## 2023-05-07 MED ORDER — CALCIUM ACETATE (PHOS BINDER) 667 MG PO CAPS
1334.0000 mg | ORAL_CAPSULE | Freq: Three times a day (TID) | ORAL | Status: DC
  Administered 2023-05-07 – 2023-05-14 (×16): 1334 mg via ORAL
  Administered 2023-05-15: 667 mg via ORAL
  Administered 2023-05-16 – 2023-05-18 (×8): 1334 mg via ORAL
  Filled 2023-05-07 (×29): qty 2

## 2023-05-07 MED ORDER — HEPARIN (PORCINE) 25000 UT/250ML-% IV SOLN
800.0000 [IU]/h | INTRAVENOUS | Status: DC
  Administered 2023-05-07: 800 [IU]/h via INTRAVENOUS
  Filled 2023-05-07: qty 250

## 2023-05-07 MED ORDER — HEPARIN SODIUM (PORCINE) 1000 UNIT/ML IJ SOLN
1000.0000 [IU] | INTRAMUSCULAR | Status: DC | PRN
  Administered 2023-05-07 (×2): 1000 [IU] via INTRAVENOUS

## 2023-05-07 NOTE — Plan of Care (Signed)
  Problem: Education: Goal: Knowledge of risk factors and measures for prevention of condition will improve Outcome: Progressing   Problem: Coping: Goal: Psychosocial and spiritual needs will be supported Outcome: Progressing   Problem: Respiratory: Goal: Will maintain a patent airway Outcome: Progressing Goal: Complications related to the disease process, condition or treatment will be avoided or minimized Outcome: Progressing   Problem: Education: Goal: Knowledge of General Education information will improve Description: Including pain rating scale, medication(s)/side effects and non-pharmacologic comfort measures Outcome: Progressing   Problem: Health Behavior/Discharge Planning: Goal: Ability to manage health-related needs will improve Outcome: Progressing   Problem: Clinical Measurements: Goal: Ability to maintain clinical measurements within normal limits will improve Outcome: Progressing Goal: Will remain free from infection Outcome: Progressing Goal: Diagnostic test results will improve Outcome: Progressing Goal: Respiratory complications will improve Outcome: Progressing Goal: Cardiovascular complication will be avoided Outcome: Progressing   Problem: Activity: Goal: Risk for activity intolerance will decrease Outcome: Progressing   Problem: Nutrition: Goal: Adequate nutrition will be maintained Outcome: Progressing   Problem: Coping: Goal: Level of anxiety will decrease Outcome: Progressing   Problem: Elimination: Goal: Will not experience complications related to bowel motility Outcome: Progressing Goal: Will not experience complications related to urinary retention Outcome: Progressing   Problem: Pain Managment: Goal: General experience of comfort will improve and/or be controlled Outcome: Progressing   Problem: Safety: Goal: Ability to remain free from injury will improve Outcome: Progressing   Problem: Skin Integrity: Goal: Risk for impaired  skin integrity will decrease Outcome: Progressing   Problem: Education: Goal: Ability to describe self-care measures that may prevent or decrease complications (Diabetes Survival Skills Education) will improve Outcome: Progressing Goal: Individualized Educational Video(s) Outcome: Progressing   Problem: Coping: Goal: Ability to adjust to condition or change in health will improve Outcome: Progressing   Problem: Fluid Volume: Goal: Ability to maintain a balanced intake and output will improve Outcome: Progressing   Problem: Health Behavior/Discharge Planning: Goal: Ability to identify and utilize available resources and services will improve Outcome: Progressing Goal: Ability to manage health-related needs will improve Outcome: Progressing   Problem: Metabolic: Goal: Ability to maintain appropriate glucose levels will improve Outcome: Progressing   Problem: Nutritional: Goal: Maintenance of adequate nutrition will improve Outcome: Progressing Goal: Progress toward achieving an optimal weight will improve Outcome: Progressing   Problem: Skin Integrity: Goal: Risk for impaired skin integrity will decrease Outcome: Progressing   Problem: Tissue Perfusion: Goal: Adequacy of tissue perfusion will improve Outcome: Progressing

## 2023-05-07 NOTE — Procedures (Signed)
Received patient in bed at bedside. .  Alert and oriented.  Informed consent signed and in chart.   TX duration:3hrs.  Patient tolerated well.    Alert, without acute distress.  Hand-off given to patient's nurse.   Access used: Right chest HD catheter.  Access issues: NONE  Total UF removed: . Medication(s) given: NONE    [facility]

## 2023-05-07 NOTE — Consult Note (Addendum)
[facility] GI Inpatient Consult Note   Jamey Reas, M.D.  Reason for Consult: "Possible GI bleed", hemoccult positive stool.   Attending Requesting Consult: Gillis Santa, M.D.   History of Present Illness: Kyle Roberson is a 83 y.o. male admitted for acute weakness cough and rhinorrhea, diagnosed with COVID-19 at hospital admission.  Patient is receiving supportive care.  He also had some acute diarrhea which is somewhat improved.  There is a report by nursing staff this morning the patient passed "dark stool".  Hemoccult testing was then ordered which was positive.  There was a slight decrease in serum hemoglobin from 10.6-9.2 which is now 9.4 upon recheck.  Patient denies any abdominal pain, hematemesis or known history of melena.  He denies any further stools today.  He denies fever.  He is undergoing hemodialysis at this time.  He remarks having a colonoscopy about 10 years ago in the local area which was "normal".  At this time he prefers not to undergo any type of elective or urgent endoscopic procedures.  Past Medical History:  Past Medical History:  Diagnosis Date   AKI (acute kidney injury) (HCC) 07/06/2018   CAD (coronary artery disease)    a. Remote PCI/stenting to LAD w PTCA Diagnoal. RCA 60%; b.  2005/2008 Cardiolites w/ reportedly mild ischemia in Diag territory-->Med rx.   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 04/2019 Echo: EF 55-60%, no rwma, nl RV fxn, RVSP 55.2mmHg. Mod dil LA. Mild-mod MR. Mod dil PA.   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 04/2019 Echo: RVSP 55.9mmHg.   Permanent atrial fibrillation (HCC)    a. CHA2DS2VASc = 5-->dose adjusted eliquis  (age/creat).   Subdural hematoma (HCC)    a. 01/2021 in setting of fall s/p L frontotemporal craniotomy.    Problem List: Patient Active Problem List   Diagnosis Date Noted   Uremia of renal origin 05/07/2023   Demand ischemia (HCC) 05/06/2023   COVID-19 05/05/2023   Myocardial injury 05/05/2023   Type II diabetes mellitus with renal manifestations (HCC) 05/05/2023   Diarrhea 05/05/2023   Fall 05/05/2023   Obesity (BMI 30-39.9) 05/05/2023   Weakness 01/04/2023   ESRD on dialysis (HCC) 12/09/2022   Cardiogenic shock (HCC) 12/04/2022   Cardiorenal syndrome 12/04/2022   Pressure injury of skin 12/04/2022   Acute on chronic diastolic CHF (congestive heart failure) (HCC) 11/26/2022   Hypervolemia 11/26/2022   Pulmonary hypertension, unspecified (HCC) 02/19/2022   Encounter regarding vascular access for dialysis for end-stage renal disease (HCC) 02/19/2022   Permanent atrial fibrillation (HCC) 02/11/2022   Paroxysmal atrial fibrillation (HCC) 02/09/2022   Dyslipidemia 02/09/2022   Depression 02/09/2022   Elevated troponin 02/09/2022   Thyroid nodule 06/04/2021   Diabetic peripheral neuropathy (HCC)    History of subdural hemorrhage 02/04/2021   Hyperlipidemia associated with type 2 diabetes mellitus (HCC)    Acute on chronic right-sided heart failure (HCC) 05/07/2019   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 04/28/2019   CKD (chronic kidney disease) stage 4, GFR 15-29 ml/min (HCC) 04/27/2019   Acute on chronic heart failure with preserved ejection fraction (HFpEF) (HCC) 04/27/2019   Obesity, Class  III, BMI 40-49.9 (morbid obesity) (HCC) 04/27/2019   Lymphedema 03/23/2019   Erectile dysfunction 08/01/2018   History of colonic diverticulitis 08/01/2018   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   Venous insufficiency 08/01/2018   Persistent atrial fibrillation (HCC) 08/01/2018   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   Essential hypertension 07/06/2018   CAD  (coronary artery disease) 07/06/2018   Type 2 diabetes mellitus with stage 4 chronic kidney disease, without long-term current use of insulin (HCC) 07/06/2018   Coronary artery disease involving native coronary artery of native heart without angina pectoris 06/05/2018   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/30/2017   Vitamin D deficiency 01/30/2017   Benign non-nodular prostatic hyperplasia with lower urinary tract symptoms 09/29/2014   Nephrolithiasis 01/04/2012    Past Surgical History: Past Surgical History:  Procedure Laterality Date   BLEPHAROPLASTY     CARDIAC CATHETERIZATION     CORONARY STENT INTERVENTION     CRANIOTOMY Left 02/04/2021   Procedure: CRANIOTOMY FOR LEFT SUBDURAL  HEMATOMA EVACUATION;  Surgeon: Lucy Chris, MD;  Location: [facility] ORS;  Service: Neurosurgery;  Laterality: Left;   CYSTOSCOPY     DIALYSIS/PERMA CATHETER INSERTION N/A 12/14/2022   Procedure: DIALYSIS/PERMA CATHETER INSERTION;  Surgeon: Annice Needy, MD;  Location: [facility] INVASIVE CV LAB;  Service: Cardiovascular;  Laterality: N/A;   DIALYSIS/PERMA CATHETER REPAIR N/A 12/16/2022   Procedure: DIALYSIS/PERMA CATHETER REPAIR;  Surgeon: Annice Needy, MD;  Location: [facility] INVASIVE CV LAB;  Service: Cardiovascular;  Laterality: N/A;   HERNIA REPAIR     RIGHT HEART CATH N/A 12/02/2022   Procedure: RIGHT HEART CATH;  Surgeon: Laurey Morale, MD;  Location: [facility] INVASIVE CV LAB;  Service: Cardiovascular;  Laterality: N/A;    Allergies: Allergies  Allergen Reactions   Atorvastatin Hives, Itching and Other (See Comments)    Other reaction(s): Other (See Comments)   Simvastatin Hives, Itching and Other (See Comments)    Other reaction(s): Other (See Comments)    Home Medications: Medications Prior to Admission  Medication Sig Dispense Refill Last Dose/Taking   acetaminophen (TYLENOL) 325 MG tablet Take 1-2 tablets (325-650 mg total) by mouth every 4 (four) hours as needed for mild pain (pain score 1-3).   Taking  As Needed   apixaban (ELIQUIS) 2.5 MG TABS tablet Take 1 tablet (2.5 mg total) by mouth 2 (two) times daily. 180 tablet 1 05/04/2023   Ascorbic Acid (VITAMIN C PO) Take 1 tablet by mouth in the morning.   05/04/2023   Cholecalciferol (VITAMIN D3) 1.25 MG (50000 UT) CAPS Take 1 tablet by mouth in the morning.   05/04/2023   dorzolamide-timolol (COSOPT) 2-0.5 % ophthalmic solution Place 1 drop into both eyes daily.   Past Week   furosemide (LASIX) 80 MG tablet Take 80 mg by mouth 4 (four) times a week.   Past Week   hydrocerin (EUCERIN) CREA Apply 1 Application topically 3 (three) times daily.   05/04/2023   multivitamin (RENA-VIT) TABS tablet Take 1 tablet by mouth at bedtime. 30 tablet 0 05/04/2023   neomycin-polymyxin b-dexamethasone (MAXITROL) 3.5-10000-0.1 OINT Place 1 Application into the right eye 3 (three) times daily. 3.5 g 1 Past Week   nitroGLYCERIN (NITROLINGUAL) 0.4 MG/SPRAY spray Place 1 spray under the tongue as directed. 12 g 1 Taking   Omega-3 Fatty Acids (FISH OIL PO) Take 1 capsule by mouth in the morning.   05/04/2023   rosuvastatin (CRESTOR) 10 MG tablet Take 1 tablet (10 mg total) by mouth 3 (three) times a week. 45 tablet 3  05/04/2023   Calcium Acetate 667 MG TABS Take 1 tablet by mouth in the morning, at noon, and at bedtime.      Home medication reconciliation was completed with the patient.   Scheduled Inpatient Medications:    ascorbic acid  250 mg Oral Daily   calcium acetate  1,334 mg Oral TID WC   Chlorhexidine Gluconate Cloth  6 each Topical Daily   clotrimazole-betamethasone   Topical BID   dorzolamide-timolol  1 drop Both Eyes Daily   insulin aspart  0-5 Units Subcutaneous QHS   insulin aspart  0-9 Units Subcutaneous TID WC   multivitamin  1 tablet Oral QHS   neomycin-polymyxin b-dexamethasone  1 Application Right Eye TID   pantoprazole (PROTONIX) IV  40 mg Intravenous Q12H   rosuvastatin  10 mg Oral Once per day on Monday Wednesday Friday    Continuous  Inpatient Infusions:    PRN Inpatient Medications:  acetaminophen, albuterol, dextromethorphan-guaiFENesin, hydrALAZINE, loperamide, nitroGLYCERIN, ondansetron (ZOFRAN) IV  Family History: family history includes CAD in his father; Diabetes in his brother; Pancreatic cancer in his mother.   GI Family History: Negative  Social History:   reports that he has quit smoking. He has never used smokeless tobacco. He reports that he does not currently use alcohol after a past usage of about 2.0 standard drinks of alcohol per week. He reports that he does not currently use drugs. The patient denies ETOH, tobacco, or drug use.    Review of Systems: Review of Systems - Negative except in HPI  Physical Examination: BP (!) 113/58 (BP Location: Right Arm)   Pulse (!) 44   Temp 98.1 F (36.7 C) (Oral)   Resp 20   Ht 5\' 9"  (1.753 m)   Wt 96.8 kg   SpO2 98%   BMI 31.51 kg/m  Physical Exam Constitutional:      Appearance: Normal appearance. He is not ill-appearing, toxic-appearing or diaphoretic.  HENT:     Head: Normocephalic and atraumatic.     Nose: Nose normal.  Eyes:     General: No scleral icterus.       Right eye: No discharge.        Left eye: No discharge.     Pupils: Pupils are equal, round, and reactive to light.  Cardiovascular:     Pulses: Normal pulses.     Heart sounds: No murmur heard.    No gallop.  Abdominal:     General: There is distension.     Palpations: There is no mass.     Tenderness: There is no abdominal tenderness. There is no guarding or rebound.     Hernia: No hernia is present.  Musculoskeletal:        General: Swelling present. No signs of injury.     Right lower leg: Edema present.     Left lower leg: Edema present.  Skin:    General: Skin is dry.     Findings: No bruising.  Neurological:     General: No focal deficit present.     Mental Status: He is alert.  Psychiatric:        Mood and Affect: Mood normal.    Data: Lab Results   Component Value Date   WBC 8.9 05/07/2023   HGB 9.4 (L) 05/07/2023   HCT 29.3 (L) 05/07/2023   MCV 95.4 05/07/2023   PLT 154 05/07/2023   Recent Labs  Lab 05/06/23 0019 05/07/23 0749 05/07/23 1044  HGB 10.6* 9.2* 9.4*  Lab Results  Component Value Date   NA 141 05/07/2023   K 5.4 (H) 05/07/2023   CL 103 05/07/2023   CO2 15 (L) 05/07/2023   BUN 137 (H) 05/07/2023   CREATININE 11.98 (H) 05/07/2023   GLU 139 07/22/2018   Lab Results  Component Value Date   ALT 26 05/05/2023   AST 24 05/05/2023   ALKPHOS 51 05/05/2023   BILITOT 0.8 05/05/2023   Recent Labs  Lab 05/06/23 1434 05/06/23 2155 05/07/23 0749  APTT 52*   < > 98*  INR 1.3*  --   --    < > = values in this interval not displayed.      Latest Ref Rng & Units 05/07/2023   10:44 AM 05/07/2023    7:49 AM 05/06/2023   12:19 AM  CBC  WBC 4.0 - 10.5 K/uL  8.9  6.4   Hemoglobin 13.0 - 17.0 g/dL 9.4  9.2  16.1   Hematocrit 39.0 - 52.0 % 29.3  27.2  31.5   Platelets 150 - 400 K/uL  154  191     STUDIES: DG Chest Port 1 View Result Date: 05/05/2023 CLINICAL DATA:  Fall diarrhea EXAM: PORTABLE CHEST 1 VIEW COMPARISON:  12/20/2022 FINDINGS: Right-sided central venous catheter tip at the SVC. Cardiomegaly with vascular congestion. No consolidation. Aortic atherosclerosis. No pneumothorax IMPRESSION: Cardiomegaly with vascular congestion. Electronically Signed   By: Jasmine Pang M.D.   On: 05/05/2023 20:38   CT Head Wo Contrast Result Date: 05/05/2023 CLINICAL DATA:  Syncope presyncope EXAM: CT HEAD WITHOUT CONTRAST TECHNIQUE: Contiguous axial images were obtained from the base of the skull through the vertex without intravenous contrast. RADIATION DOSE REDUCTION: This exam was performed according to the departmental dose-optimization program which includes automated exposure control, adjustment of the mA and/or kV according to patient size and/or use of iterative reconstruction technique. COMPARISON:  CT brain  04/08/2023 FINDINGS: Brain: No acute territorial infarction, hemorrhage or intracranial mass. Atrophy and chronic small vessel ischemic changes of the white matter. Stable ventricle size. Vascular: No hyperdense vessels.  Carotid vascular calcification. Skull: Normal. Negative for fracture or focal lesion. Left craniotomy Sinuses/Orbits: Mucosal thickening in the ethmoid sinuses Other: None IMPRESSION: 1. No CT evidence for acute intracranial abnormality. 2. Atrophy and chronic small vessel ischemic changes of the white matter. Electronically Signed   By: Jasmine Pang M.D.   On: 05/05/2023 18:17   @IMAGES @  Assessment:  Acute diarrhea - C Dif negative. Improving. COVID-19 infection - on supportive care only for minimal symptoms. Questionable melenic stool. Acute on chronic Anemia - slight drop in Hgb, unclear if related to GIB vs. Hemodilution from fluid overload. End Stage Renal disease - currently on hemodialysis today. Elevated Troponin - Demand ischemia per cardiology with no acute ischemic findings. CAD s/p coronary stenting. Long term anticoagulation - Eliquis, held. ON IV Heparin, also held.   COVID-19 status: Tested positive      Recommendations:  Resume anticoagulation. Serial H/H as you are doing. Will follow along and assess for any bleeding. Recommendations relayed to Dr. Lucianne Muss.  Thank you for the consult. Please call with questions or concerns.  Rosina Lowenstein, "Mellody Dance MD [facility] [address] [place], [place] [address] [phone]  05/07/2023 3:29 PM

## 2023-05-07 NOTE — Progress Notes (Signed)
[facility]  ROUNDING NOTE   Subjective:   Kyle Roberson is a 83 y.o. male with diabetes mellitus type II, hypertension, coronary artery disease, chronic diastolic congestive heart failure, atrial fibrillation, pulmonary hypertension, and historyof subdural hematoma who is admitted for Weakness [R53.1] Fall, initial encounter [W19.XXXA] COVID-19 virus infection [U07.1] COVID-19 [U07.1]  Patient is known to our practice and receives outpatient dialysis at [facility] on a MWF, supervised by Dr Cherylann Ratel. He received his last treatment on Wednesday.   Patient seen sitting up in bed Denies shortness of breath  Scheduled for dialysis later today   Objective:  Vital signs in last 24 hours:  Temp:  [98.2 F (36.8 C)-98.9 F (37.2 C)] 98.2 F (36.8 C) (02/14 0859) Pulse Rate:  [55-62] 55 (02/14 0859) Resp:  [16-21] 20 (02/14 0859) BP: (106-132)/(48-57) 106/48 (02/14 0859) SpO2:  [93 %-97 %] 97 % (02/14 0859)  Weight change:  Filed Weights   05/05/23 1310 05/05/23 2217 05/06/23 0500  Weight: 95.3 kg 98.4 kg 98.4 kg    Intake/Output: I/O last 3 completed shifts: In: 50.4 [I.V.:50.4] Out: 50 [Urine:50]   Intake/Output this shift:  Total I/O In: 8 [I.V.:8] Out: -   Physical Exam: General: NAD  Head: Normocephalic, atraumatic. Moist oral mucosal membranes  Eyes: Anicteric  Lungs:  Clear to auscultation, room air  Heart: Regular rate and rhythm  Abdomen:  Soft, nontender  Extremities: 1+ peripheral edema.  Neurologic: Alert and oriented, moving all four extremities  Skin: No lesions  Access: Right chest tunneled access    Basic Metabolic Panel: Recent Labs  Lab 05/05/23 1313 05/06/23 0019 05/06/23 0906 05/07/23 0749  NA 141 140  --  141  K 4.7 4.6  --  5.4*  CL 101 103  --  103  CO2 18* 15*  --  15*  GLUCOSE 203* 222*  --  100*  BUN 102* 115*  --  137*  CREATININE 10.02* 10.54*  --  11.98*  CALCIUM 7.3* 7.0*  --  7.0*  MG  --   --  1.9 1.9   PHOS  --   --  11.9* >30.0*    Liver Function Tests: Recent Labs  Lab 05/05/23 1313  AST 24  ALT 26  ALKPHOS 51  BILITOT 0.8  PROT 6.4*  ALBUMIN 3.2*   Recent Labs  Lab 05/05/23 1313  LIPASE 60*   No results for input(s): "AMMONIA" in the last 168 hours.  CBC: Recent Labs  Lab 05/05/23 1313 05/06/23 0019 05/07/23 0749 05/07/23 1044  WBC 5.6 6.4 8.9  --   HGB 10.9* 10.6* 9.2* 9.4*  HCT 33.4* 31.5* 27.2* 29.3*  MCV 97.7 96.0 95.4  --   PLT 197 191 154  --     Cardiac Enzymes: No results for input(s): "CKTOTAL", "CKMB", "CKMBINDEX", "TROPONINI" in the last 168 hours.  BNP: Invalid input(s): "POCBNP"  CBG: Recent Labs  Lab 05/06/23 0811 05/06/23 1158 05/06/23 1656 05/06/23 2146 05/07/23 0847  GLUCAP 106* 135* 128* 155* 81    Microbiology: Results for orders placed or performed during the hospital encounter of 05/05/23  Resp panel by RT-PCR (RSV, Flu A&B, Covid) Anterior Nasal Swab     Status: Abnormal   Collection Time: 05/05/23  4:33 PM   Specimen: Anterior Nasal Swab  Result Value Ref Range Status   SARS Coronavirus 2 by RT PCR POSITIVE (A) NEGATIVE Final    Comment: (NOTE) SARS-CoV-2 target nucleic acids are DETECTED.  The SARS-CoV-2 RNA is generally  detectable in upper respiratory specimens during the acute phase of infection. Positive results are indicative of the presence of the identified virus, but do not rule out bacterial infection or co-infection with other pathogens not detected by the test. Clinical correlation with patient history and other diagnostic information is necessary to determine patient infection status. The expected result is Negative.  Fact Sheet for Patients: BloggerCourse.com  Fact Sheet for Healthcare Providers: SeriousBroker.it  This test is not yet approved or cleared by the [place] FDA and  has been authorized for detection and/or diagnosis of SARS-CoV-2  by FDA under an Emergency Use Authorization (EUA).  This EUA will remain in effect (meaning this test can be used) for the duration of  the COVID-19 declaration under Section 564(b)(1) of the A ct, 21 U.S.C. section 360bbb-3(b)(1), unless the authorization is terminated or revoked sooner.     Influenza A by PCR NEGATIVE NEGATIVE Final   Influenza B by PCR NEGATIVE NEGATIVE Final    Comment: (NOTE) The Xpert Xpress SARS-CoV-2/FLU/RSV plus assay is intended as an aid in the diagnosis of influenza from Nasopharyngeal swab specimens and should not be used as a sole basis for treatment. Nasal washings and aspirates are unacceptable for Xpert Xpress SARS-CoV-2/FLU/RSV testing.  Fact Sheet for Patients: BloggerCourse.com  Fact Sheet for Healthcare Providers: SeriousBroker.it  This test is not yet approved or cleared by the [place] FDA and has been authorized for detection and/or diagnosis of SARS-CoV-2 by FDA under an Emergency Use Authorization (EUA). This EUA will remain in effect (meaning this test can be used) for the duration of the COVID-19 declaration under Section 564(b)(1) of the Act, 21 U.S.C. section 360bbb-3(b)(1), unless the authorization is terminated or revoked.     Resp Syncytial Virus by PCR NEGATIVE NEGATIVE Final    Comment: (NOTE) Fact Sheet for Patients: BloggerCourse.com  Fact Sheet for Healthcare Providers: SeriousBroker.it  This test is not yet approved or cleared by the [place] FDA and has been authorized for detection and/or diagnosis of SARS-CoV-2 by FDA under an Emergency Use Authorization (EUA). This EUA will remain in effect (meaning this test can be used) for the duration of the COVID-19 declaration under Section 564(b)(1) of the Act, 21 U.S.C. section 360bbb-3(b)(1), unless the authorization is terminated or revoked.  Performed at  [facility], [address] [place], [place] [address]   C Difficile Quick Screen w PCR reflex     Status: None   Collection Time: 05/05/23  7:35 PM   Specimen: STOOL  Result Value Ref Range Status   C Diff antigen NEGATIVE NEGATIVE Final   C Diff toxin NEGATIVE NEGATIVE Final   C Diff interpretation No C. difficile detected.  Final    Comment: Performed at [facility], [address] [place], [place] [address]    Coagulation Studies: Recent Labs    05/06/23 1434  LABPROT 16.7*  INR 1.3*    Urinalysis: No results for input(s): "COLORURINE", "LABSPEC", "PHURINE", "GLUCOSEU", "HGBUR", "BILIRUBINUR", "KETONESUR", "PROTEINUR", "UROBILINOGEN", "NITRITE", "LEUKOCYTESUR" in the last 72 hours.  Invalid input(s): "APPERANCEUR"    Imaging: DG Chest Port 1 View Result Date: 05/05/2023 CLINICAL DATA:  Fall diarrhea EXAM: PORTABLE CHEST 1 VIEW COMPARISON:  12/20/2022 FINDINGS: Right-sided central venous catheter tip at the SVC. Cardiomegaly with vascular congestion. No consolidation. Aortic atherosclerosis. No pneumothorax IMPRESSION: Cardiomegaly with vascular congestion. Electronically Signed   By: Jasmine Pang M.D.   On: 05/05/2023 20:38   CT Head Wo Contrast Result Date: 05/05/2023  CLINICAL DATA:  Syncope presyncope EXAM: CT HEAD WITHOUT CONTRAST TECHNIQUE: Contiguous axial images were obtained from the base of the skull through the vertex without intravenous contrast. RADIATION DOSE REDUCTION: This exam was performed according to the departmental dose-optimization program which includes automated exposure control, adjustment of the mA and/or kV according to patient size and/or use of iterative reconstruction technique. COMPARISON:  CT brain 04/08/2023 FINDINGS: Brain: No acute territorial infarction, hemorrhage or intracranial mass. Atrophy and chronic small vessel ischemic changes of the white matter. Stable ventricle size. Vascular: No hyperdense vessels.  Carotid  vascular calcification. Skull: Normal. Negative for fracture or focal lesion. Left craniotomy Sinuses/Orbits: Mucosal thickening in the ethmoid sinuses Other: None IMPRESSION: 1. No CT evidence for acute intracranial abnormality. 2. Atrophy and chronic small vessel ischemic changes of the white matter. Electronically Signed   By: Jasmine Pang M.D.   On: 05/05/2023 18:17     Medications:    heparin 800 Units/hr (05/07/23 0800)    ascorbic acid  250 mg Oral Daily   calcium acetate  667 mg Oral TID WC   Chlorhexidine Gluconate Cloth  6 each Topical Daily   clotrimazole-betamethasone   Topical BID   dorzolamide-timolol  1 drop Both Eyes Daily   insulin aspart  0-5 Units Subcutaneous QHS   insulin aspart  0-9 Units Subcutaneous TID WC   metoprolol tartrate  12.5 mg Oral BID   multivitamin  1 tablet Oral QHS   neomycin-polymyxin b-dexamethasone  1 Application Right Eye TID   pantoprazole (PROTONIX) IV  40 mg Intravenous Q12H   rosuvastatin  10 mg Oral Once per day on Monday Wednesday Friday   acetaminophen, albuterol, dextromethorphan-guaiFENesin, hydrALAZINE, loperamide, nitroGLYCERIN, ondansetron (ZOFRAN) IV  Assessment/ Plan:  Mr. Derrall Hicks is a 83 y.o.  male with diabetes mellitus type II, hypertension, coronary artery disease, chronic diastolic congestive heart failure, atrial fibrillation, pulmonary hypertension, and historyof subdural hematoma who is admitted for Weakness [R53.1] Fall, initial encounter [W19.XXXA] COVID-19 virus infection [U07.1] COVID-19 [U07.1]   COVID-19, found positive this admission.  Supportive care. Room air  2.  End-stage renal disease on hemodialysis.  Last treatment completed on Wednesday.  Will receive dialysis later today.   3. Anemia of chronic kidney disease  Lab Results  Component Value Date   HGB 9.4 (L) 05/07/2023    Hemoglobin 9.4.  Patient receives Mircera at outpatient clinic.   4. Secondary Hyperparathyroidism: with outpatient  labs: None available  Lab Results  Component Value Date   PTH 344 (H) 01/07/2023   CALCIUM 7.0 (L) 05/07/2023   CAION 1.14 (L) 12/02/2022   PHOS >30.0 (H) 05/07/2023    Will continue to monitor bone minerals during this admission.  Corrected calcium 7.9.  Hyperphosphatemia.  Will increase binders and recheck phos in am   LOS: 0 Ski Polich 2/14/202511:53 AM

## 2023-05-07 NOTE — Progress Notes (Signed)
Patient had a blood glucose of 58. This RN followed the hypoglycemia protocol and gave glucose gel. Rechecked cbg after administration and blood sugar increased to 68. This RN gave patient juice and rechecked blood sugar. Blood sugar after juice is 113. Also after speaking with patient's wife she stated that patient did not have dinner. Patient provided with [place] sandwich tray and is eating at this time.

## 2023-05-07 NOTE — Progress Notes (Signed)
Patient Name: Kyle Roberson Date of Encounter: 05/07/2023 [facility] Cardiologist: Julien Nordmann, MD   Interval Summary  .    Patient feeling better.  He denies chest pain and shortness of breath.  He would like to go home as soon as possible.  Vital Signs .    Vitals:   05/06/23 1602 05/06/23 2154 05/07/23 0859 05/07/23 1215  BP:   (!) 106/48 (!) 101/51  Pulse:   (!) 55 (!) 46  Resp: 16 (!) 21 20   Temp:   98.2 F (36.8 C) 98.2 F (36.8 C)  TempSrc:  Oral  Oral  SpO2:   97% 96%  Weight:      Height:        Intake/Output Summary (Last 24 hours) at 05/07/2023 1435 Last data filed at 05/07/2023 1356 Gross per 24 hour  Intake 105.21 ml  Output --  Net 105.21 ml      05/06/2023    5:00 AM 05/05/2023   10:17 PM 05/05/2023    1:10 PM  Last 3 Weights  Weight (lbs) 216 lb 14.9 oz 216 lb 14.9 oz 210 lb  Weight (kg) 98.4 kg 98.4 kg 95.255 kg      Telemetry/ECG    Atrial fibrillation with slow ventricular response, frequently with likely junctional rhythm.- Personally Reviewed  Physical Exam .   GEN: No acute distress.   Neck: No JVD Cardiac: Bradycardic but regular without murmurs Respiratory: Coarse breath sounds bilaterally, diminished at the lung bases. GI: Soft, nontender, non-distended  MS: Trace pretibial edema with chronic skin changes and marked tenderness with light palpation of the pretibial regions.  Assessment & Plan .     Elevated troponin: Ms. Kyle Roberson denies angina or dyspnea, suggesting elevated troponin most likely due to supply-demand mismatch in the setting of COVID-19 infection.  Echocardiogram has been performed with interpretation pending.  His LVEF appears normal on my assessment.  He was previously scheduled for outpatient myocardial perfusion stress test.  Once he has recovered from his COVID-19 infection, proceeding with stress test versus cardiac catheterization will need to be considered.  As his echo suggest significant  pulmonary hypertension, right and left heart catheterization may provide the most information.  Patient is currently on heparin infusion given elevated troponin.  It is okay to complete 48-hour infusion and then transition back to PTA dose of apixaban.  Continue aspirin 81 mg daily for now, though I would not continue this long-term given high risk of bleeding in the setting of long-term anticoagulation with apixaban.  Atrial fibrillation: Patient remains in atrial fibrillation with slow ventricular rates, often in a likely junctional rhythm.  I will discontinue metoprolol.  Patient had been on apixaban, though it was discontinued by Dr. Lucianne Muss and transition to heparin infusion due to elevated troponin.  Transition back to apixaban 2.5 mg twice daily prior to discharge.  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  Follow-up formal echocardiogram assessment.  Ongoing volume management per nephrology via hemodialysis.  Continue outpatient follow-up with advanced heart failure team.  May benefit from right and left heart catheterization in place of MPI for preoperative cardiovascular risk assessment for future peritoneal dialysis catheter given elevated troponin and elevated pulmonary artery pressures (could likely be done as an outpatient when the patient has recovered from COVID-19).  Weakness and COVID-19 infection: [name] is feeling somewhat better.  Continue supportive care per primary team.  For questions or updates, please contact [facility] Please  consult [email or web address] for contact info under [facility].  Signed, Yvonne Kendall, MD

## 2023-05-07 NOTE — Progress Notes (Signed)
PT Cancellation Note  Patient Details Name: Kyle Roberson MRN: [id number] DOB: [birth year]   Cancelled Treatment:    Reason Eval/Treat Not Completed: Patient declined, no reason specified Patient initially agreed to sit up on side of bed, then refused. Will attempt 1 more time.   Nyelle Wolfson 05/07/2023, 11:25 AM

## 2023-05-07 NOTE — Progress Notes (Addendum)
PHARMACY - ANTICOAGULATION CONSULT NOTE  Pharmacy Consult for Heparin Infusion Indication: chest pain/ACS  Patient Measurements: Height: 5\' 9"  (175.3 cm) Weight: 98.4 kg (216 lb 14.9 oz) IBW/kg (Calculated) : 70.7 Heparin Dosing Weight: 91.4 kg  Vital Signs: Temp: 98.2 F (36.8 C) (02/14 0859) Temp Source: Oral (02/13 2154) BP: 106/48 (02/14 0859) Pulse Rate: 55 (02/14 0859)  Labs: Recent Labs    05/05/23 1313 05/05/23 1712 05/06/23 0019 05/06/23 0906 05/06/23 1201 05/06/23 1434 05/06/23 2155 05/07/23 0749  HGB 10.9*  --  10.6*  --   --   --   --  9.2*  HCT 33.4*  --  31.5*  --   --   --   --  27.2*  PLT 197  --  191  --   --   --   --  154  APTT  --   --   --   --   --  52* 161* 98*  LABPROT  --   --   --   --   --  16.7*  --   --   INR  --   --   --   --   --  1.3*  --   --   HEPARINUNFRC  --   --   --   --   --  >1.10*  --  >1.10*  CREATININE 10.02*  --  10.54*  --   --   --   --   --   TROPONINIHS 95*   < > 97* 377* 680* 894*  --   --    < > = values in this interval not displayed.    Estimated Creatinine Clearance: 6 mL/min (A) (by C-G formula based on SCr of 10.54 mg/dL (H)).   Medical History: Past Medical History:  Diagnosis Date   AKI (acute kidney injury) (HCC) 07/06/2018   CAD (coronary artery disease)    a. Remote PCI/stenting to LAD w PTCA Diagnoal. RCA 60%; b.  2005/2008 Cardiolites w/ reportedly mild ischemia in Diag territory-->Med rx.   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 04/2019 Echo: EF 55-60%, no rwma, nl RV fxn, RVSP 55.75mmHg. Mod dil LA. Mild-mod MR. Mod dil PA.   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 04/2019 Echo: RVSP 55.23mmHg.   Permanent  atrial fibrillation (HCC)    a. CHA2DS2VASc = 5-->dose adjusted eliquis (age/creat).   Subdural hematoma (HCC)    a. 01/2021 in setting of fall s/p L frontotemporal craniotomy.    Medications:  Patient on apixaban- last dose 2/13 @ 0825  Assessment: Patient is a 83 year old male with a past medical history of ESRD-HD (MWF), HTN, HLD, DM, CAD with stent, dCHF, depression with anxiety, BPH, anemia, A-fib on Eliquis, and left eye blindness who presented to ED with concerns of cough, SOB, diarrhea, and fall. Troponin 95>105>89 initially on admission. Troponin now 377>680. Pharmacy has been consulted to initiate patient on a heparin infusion for ACS/STEMI.  Baseline aPTT, INR, and HL ordered.   No signs/symptoms of bleeding noted in chart. Hgb 10.6. PLT 191.  Goal of Therapy:  Heparin level 0.3-0.7 units/ml aPTT 66-102 seconds Monitor platelets  by anticoagulation protocol: Yes  Date/Time: aPTT / HL: Comment / Rate: 2/13@2155  161 / --  SUPRAtherapeutic@1050  units/hr 2/14@0749  aPTT=98 Therapeutic x 1; held fecal occult positive   Plan:  Noted slight drop in hgb (10.9>9.2>9.4), fecal blood positive. Heparin held per MD orders Monitor CBC daily  Vontrell Pullman Rodriguez-Guzman PharmD, BCPS 05/07/2023 9:18 AM

## 2023-05-07 NOTE — Plan of Care (Signed)
  Problem: Education: Goal: Knowledge of risk factors and measures for prevention of condition will improve Outcome: Progressing   Problem: Coping: Goal: Psychosocial and spiritual needs will be supported Outcome: Progressing   Problem: Respiratory: Goal: Will maintain a patent airway Outcome: Progressing Goal: Complications related to the disease process, condition or treatment will be avoided or minimized Outcome: Progressing   Problem: Education: Goal: Knowledge of General Education information will improve Description: Including pain rating scale, medication(s)/side effects and non-pharmacologic comfort measures Outcome: Progressing   Problem: Clinical Measurements: Goal: Ability to maintain clinical measurements within normal limits will improve Outcome: Progressing Goal: Will remain free from infection Outcome: Progressing Goal: Diagnostic test results will improve Outcome: Progressing Goal: Respiratory complications will improve Outcome: Progressing   Problem: Activity: Goal: Risk for activity intolerance will decrease Outcome: Not Progressing   Problem: Nutrition: Goal: Adequate nutrition will be maintained Outcome: Progressing

## 2023-05-07 NOTE — Progress Notes (Signed)
OT Cancellation Note  Patient Details Name: Yves Fodor MRN: [id number] DOB: 1940-04-28   Cancelled Treatment:    Reason Eval/Treat Not Completed: Patient declined, no reason specified. OT eval attempted, pt irritated, refusing OOB mobility. Initially agreed to participate by sitting EOB, but then refuses becoming increasingly agitated with attempts to encourage pt. Will re-attempt as able.    Of note, pt's HR avg 40s with intermittent increase to 55 but drops back down to 40s quickly, SpO2 on RA 95-96%. Updated MD and RN.   Akeylah Hendel L. Sagal Gayton, OTR/L  05/07/23, 11:50 AM

## 2023-05-07 NOTE — Progress Notes (Signed)
[facility] Progress Note  Patient: Kyle Roberson    BJY:[id number]  DOA: 05/05/2023     Date of Service: the patient was seen and examined on 05/07/2023  Chief Complaint  Patient presents with   Fall   Brief hospital course:  Nehemias Sauceda is a 83 y.o. male with medical history significant of ESRD-HD (MWF), HTN, HLD, DM, CAD with stent, dCHF, depression with anxiety, BPH, anemia, A-fib on Eliquis, left eye blindness, who presents with cough, shortness of breath, diarrhea, fall.   Per pt and his wife (I called his wife by phone), patient has productive cough, sore throat, SOB, malaise for more than 5 days.  No chest pain.  No fever or chills.  Patient has diarrhea for more than 10 days, with 2- 3 times of watery diarrhea each day.  No nausea, vomiting or abdominal pain.  Patient fell this morning, no loss of consciousness. He patient states that he was going to the bathroom when he suddenly felt weak in his gave out and fell.  No unilateral numbness or tingling in extremities.  No facial droop or slurred speech.  Patient states that he has missed several dialysis. His last dialysis was on Monday.     Data reviewed independently and ED Course: pt was found to have positive COVID 19, WBC 5.6, troponin 95 --> 105, potassium 4.7, bicarbonate 18, creatinine 10.02, BUN 102.  CT head negative. Chest x-ray showed cardiomegaly with vascular congestion.  Patient is placed on telemetry bed for observation.     EKG: I have personally reviewed.  A-fib, QTc 461, low voltage, poor IV progression, anteroseptal infarction pattern.   Assessment and Plan:  # COVID-19 virus infection: No fever, no leukocytosis, no oxygen desaturation.   Chest x-ray showed vascular congestion, no infiltration.  -Bronchodilators and as needed Mucinex -Supportive care    # CAD (coronary artery disease) and Myocardial injury:  Troponin 894, but No chest pain and no significant EKG changes Continue to monitor on  telemetry S/p ASA 81 mg and heparin IV infusion, discontinued on 2/14, due to GI bleed 2/14 continue to hold Eliquis for now Cardiology consulted, recommended no ischemic workup at this time, follow as an outpatient for Myoview when recovered after COVID infection Follow 2D echocardiogram As per cardio patient may need right and left heart cath due to pulmonary hypertension and elevated troponin.  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 Cherylann Ratel of renal Continue hemodialysis MWF schedule Hyperphosphatemia, started PhosLo   Fall at home, initial encounter: CT-head negative -Fall precaution -PT/OT    Type II diabetes mellitus with renal manifestations:  Recent A1c 7.3, poorly controlled for patient taking glipizide at home -SSI   Essential hypertension: Patient's not taking medications.   Nonsudden V. tach episode 7 beats run at 630 on 2/13 reported 2/13 started metoprolol 12.5 mg p.o. twice daily -IV hydralazine as needed    Dyslipidemia -Crestor   Diarrhea: Possibly due to COVID infection -As needed Imodium -C. 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  Interventions:  Diet: Renal diet DVT Prophylaxis: Heparin IV infusion  Advance goals of care discussion: Full code  Family Communication: family was not present at bedside, at the time of interview.  The pt provided permission to discuss medical plan with the family. Opportunity was given to ask question and all  questions were answered satisfactorily.   Disposition:  Pt is from home, admitted with COVID viral infection, missed hemodialysis, elevated troponin on IV heparin infusion, which precludes a safe discharge. Discharge to SNF, when stable, may need few days to improve. PT and OT eval Follow TOC for placement   Subjective: No significant overnight events, patient said that he does not feel he good, does not prefer to get hemodialysis done.  Patient was convinced that all his feelings are due to uremia so he needs hemodialysis, I try to convince patient to get hemodialysis done. Patient denied any chest pain or repressing, no any other complaints  Physical Exam: General: Mild to moderate respiratory distress affect anxious and depressed  Eyes: PERRLA ENT: Oral Mucosa Clear, moist  Neck: no JVD,  Cardiovascular: S1 and S2 Present, no Murmur,  Respiratory: Equal air entry bilaterally, mild bibasilar crackles, no wheezes Abdomen: Bowel Sound present, Soft and no tenderness,  Skin: no rashes Extremities: Chronic lymphedema and chronic skin changes, dry scaly skin  Neurologic: without any new focal findings Gait not checked due to patient safety concerns  Vitals:   05/07/23 1509 05/07/23 1515 05/07/23 1530 05/07/23 1545  BP: (!) 113/48 (!) 113/58 126/76 (!) 112/57  Pulse: (!) 42 (!) 44 (!) 51 (!) 55  Resp: 20 20 20 20   Temp:      TempSrc:      SpO2:      Weight:      Height:        Intake/Output Summary (Last 24 hours) at 05/07/2023 1552 Last data filed at 05/07/2023 1356 Gross per 24 hour  Intake 105.21 ml  Output --  Net 105.21 ml   Filed Weights   05/05/23 2217 05/06/23 0500 05/07/23 1456  Weight: 98.4 kg 98.4 kg 96.8 kg    Data Reviewed: I have personally reviewed and interpreted daily labs, tele strips, imagings as discussed above. I reviewed all nursing notes, pharmacy notes, vitals, pertinent old records I have discussed plan of care as described above with RN and  patient/family.  CBC: Recent Labs  Lab 05/05/23 1313 05/06/23 0019 05/07/23 0749 05/07/23 1044  WBC 5.6 6.4 8.9  --   HGB 10.9* 10.6* 9.2* 9.4*  HCT 33.4* 31.5* 27.2* 29.3*  MCV 97.7 96.0 95.4  --   PLT 197 191 154  --    Basic Metabolic Panel: Recent Labs  Lab 05/05/23 1313 05/06/23 0019 05/06/23 0906 05/07/23 0749  NA 141 140  --  141  K 4.7 4.6  --  5.4*  CL 101 103  --  103  CO2 18* 15*  --  15*  GLUCOSE 203* 222*  --  100*  BUN 102* 115*  --  137*  CREATININE 10.02* 10.54*  --  11.98*  CALCIUM 7.3* 7.0*  --  7.0*  MG  --   --  1.9 1.9  PHOS  --   --  11.9* >30.0*    Studies: No results found.   Scheduled Meds:  ascorbic acid  250 mg Oral Daily   calcium acetate  1,334 mg Oral TID WC   Chlorhexidine Gluconate Cloth  6 each Topical Daily  clotrimazole-betamethasone   Topical BID   dorzolamide-timolol  1 drop Both Eyes Daily   insulin aspart  0-5 Units Subcutaneous QHS   insulin aspart  0-9 Units Subcutaneous TID WC   multivitamin  1 tablet Oral QHS   neomycin-polymyxin b-dexamethasone  1 Application Right Eye TID   pantoprazole (PROTONIX) IV  40 mg Intravenous Q12H   rosuvastatin  10 mg Oral Once per day on Monday Wednesday Friday   Continuous Infusions:   PRN Meds: acetaminophen, albuterol, dextromethorphan-guaiFENesin, hydrALAZINE, loperamide, nitroGLYCERIN, ondansetron (ZOFRAN) IV  Time spent: 55 minutes  Author: Gillis Santa. MD [facility] 05/07/2023 3:52 PM  To reach On-call, see care teams to locate the attending and reach out to them via [email or web address]. If 7PM-7AM, please contact night-coverage If you still have difficulty reaching the attending provider, please page the [facility] (Director on Call) for [facility] on amion for assistance.

## 2023-05-07 NOTE — Progress Notes (Signed)
*  PRELIMINARY RESULTS* Echocardiogram 2D Echocardiogram has been performed.  Kyle Roberson 05/07/2023, 1:34 PM

## 2023-05-07 NOTE — Progress Notes (Signed)
 PHARMACY - ANTICOAGULATION CONSULT NOTE  Pharmacy Consult for Heparin Infusion Indication: chest pain/ACS  Patient Measurements: Height: 5\' 9"  (175.3 cm) Weight: 96.8 kg (213 lb 6.5 oz) IBW/kg (Calculated) : 70.7 Heparin Dosing Weight: 91.4 kg  Vital Signs: Temp: 98.1 F (36.7 C) (02/14 1450) Temp Source: Oral (02/14 1450) BP: 122/46 (02/14 1700) Pulse Rate: 64 (02/14 1700)  Labs: Recent Labs    05/05/23 1313 05/05/23 1712 05/06/23 0019 05/06/23 0906 05/06/23 1201 05/06/23 1434 05/06/23 2155 05/07/23 0749 05/07/23 1044  HGB 10.9*  --  10.6*  --   --   --   --  9.2* 9.4*  HCT 33.4*  --  31.5*  --   --   --   --  27.2* 29.3*  PLT 197  --  191  --   --   --   --  154  --   APTT  --   --   --   --   --  52* 161* 98*  --   LABPROT  --   --   --   --   --  16.7*  --   --   --   INR  --   --   --   --   --  1.3*  --   --   --   HEPARINUNFRC  --   --   --   --   --  >1.10*  --  >1.10*  --   CREATININE 10.02*  --  10.54*  --   --   --   --  11.98*  --   TROPONINIHS 95*   < > 97*   < > 680* 894*  --  728*  --    < > = values in this interval not displayed.    Estimated Creatinine Clearance: 5.3 mL/min (A) (by C-G formula based on SCr of 11.98 mg/dL (H)).   Medical History: Past Medical History:  Diagnosis Date   AKI (acute kidney injury) (HCC) 07/06/2018   CAD (coronary artery disease)    a. Remote PCI/stenting to LAD w PTCA Diagnoal. RCA 60%; b.  2005/2008 Cardiolites w/ reportedly mild ischemia in Diag territory-->Med rx.   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 04/2019 Echo: EF 55-60%, no rwma, nl RV fxn, RVSP 55.11mmHg. Mod dil LA. Mild-mod MR. Mod dil PA.   CKD (chronic kidney disease), stage IV (HCC)    Diabetes (HCC)    GERD (gastroesophageal reflux disease)    History of MI (myocardial infarction) 06/27/2014   HLD (hyperlipidemia)    HTN (hypertension)    PAH (pulmonary artery  hypertension) (HCC)    a. 04/2019 Echo: RVSP 55.75mmHg.   Permanent atrial fibrillation (HCC)    a. CHA2DS2VASc = 5-->dose adjusted eliquis (age/creat).   Subdural hematoma (HCC)    a. 01/2021 in setting of fall s/p L frontotemporal craniotomy.    Medications:  Patient on apixaban- last dose 2/13 @ 0825  Assessment: Patient is a 83 year old male with a past medical history of ESRD-HD (MWF), HTN, HLD, DM, CAD with stent, dCHF, depression with anxiety, BPH, anemia, A-fib on Eliquis, and left eye blindness who presented to ED with concerns of cough, SOB, diarrhea, and fall. Troponin 95>105>89 initially on admission. Troponin now 377>680. Pharmacy has been consulted to initiate patient on a heparin infusion for ACS/STEMI.  Baseline aPTT, INR, and HL ordered.   No  signs/symptoms of bleeding noted in chart. Hgb 10.6. PLT 191.  Goal of Therapy:  Heparin level 0.3-0.7 units/ml aPTT 66-102 seconds Monitor platelets by anticoagulation protocol: Yes  Date/Time: aPTT / HL: Comment / Rate: 2/13@2155  161 / --  SUPRAtherapeutic@1050  units/hr 2/14@0749  aPTT=98 Therapeutic x 1; held fecal occult positive   Plan:  Heparin infusion/consult discontinued on 2/14 at 1354 due to concern for possible GI bleed (FOBT positive, questionable drop in hgb) GI was consulted and has cleared patient to resume anticoagulation Will resume heparin infusion on previous therapeutic rate of 800 units/hour and continue for 24 hours (per MD) Check aPTT level in 8 hours Monitor CBC and signs/symptoms of bleeding  Thank you for involving pharmacy in this patient's care.   Rockwell Alexandria, PharmD Clinical Pharmacist 05/07/2023 5:09 PM

## 2023-05-08 DIAGNOSIS — R531 Weakness: Secondary | ICD-10-CM | POA: Diagnosis not present

## 2023-05-08 DIAGNOSIS — Z515 Encounter for palliative care: Secondary | ICD-10-CM

## 2023-05-08 DIAGNOSIS — U071 COVID-19: Secondary | ICD-10-CM | POA: Diagnosis not present

## 2023-05-08 DIAGNOSIS — I2489 Other forms of acute ischemic heart disease: Secondary | ICD-10-CM | POA: Diagnosis not present

## 2023-05-08 LAB — BASIC METABOLIC PANEL
Anion gap: 19 — ABNORMAL HIGH (ref 5–15)
BUN: 78 mg/dL — ABNORMAL HIGH (ref 8–23)
CO2: 19 mmol/L — ABNORMAL LOW (ref 22–32)
Calcium: 7.5 mg/dL — ABNORMAL LOW (ref 8.9–10.3)
Chloride: 99 mmol/L (ref 98–111)
Creatinine, Ser: 7.23 mg/dL — ABNORMAL HIGH (ref 0.61–1.24)
GFR, Estimated: 7 mL/min — ABNORMAL LOW (ref >60)
Glucose, Bld: 188 mg/dL — ABNORMAL HIGH (ref 70–99)
Potassium: 3.7 mmol/L (ref 3.5–5.1)
Sodium: 137 mmol/L (ref 135–145)

## 2023-05-08 LAB — GLUCOSE, CAPILLARY
Glucose-Capillary: 240 mg/dL — ABNORMAL HIGH (ref 70–99)
Glucose-Capillary: 127 mg/dL — ABNORMAL HIGH (ref 70–99)
Glucose-Capillary: 183 mg/dL — ABNORMAL HIGH (ref 70–99)
Glucose-Capillary: 190 mg/dL — ABNORMAL HIGH (ref 70–99)
Glucose-Capillary: 246 mg/dL — ABNORMAL HIGH (ref 70–99)

## 2023-05-08 LAB — CBC
HCT: 25.5 % — ABNORMAL LOW (ref 39.0–52.0)
Hemoglobin: 8.8 g/dL — ABNORMAL LOW (ref 13.0–17.0)
MCH: 32 pg (ref 26.0–34.0)
MCHC: 34.5 g/dL (ref 30.0–36.0)
MCV: 92.7 fL (ref 80.0–100.0)
Platelets: 154 10*3/uL (ref 150–400)
RBC: 2.75 MIL/uL — ABNORMAL LOW (ref 4.22–5.81)
RDW: 13.9 % (ref 11.5–15.5)
WBC: 7.5 10*3/uL (ref 4.0–10.5)
nRBC: 0 % (ref 0.0–0.2)

## 2023-05-08 LAB — CK: Total CK: 94 U/L (ref 49–397)

## 2023-05-08 LAB — PHOSPHORUS: Phosphorus: 6.3 mg/dL — ABNORMAL HIGH (ref 2.5–4.6)

## 2023-05-08 LAB — APTT
aPTT: 88 s — ABNORMAL HIGH (ref 24–36)
aPTT: 79 s — ABNORMAL HIGH (ref 24–36)

## 2023-05-08 LAB — HEMOGLOBIN AND HEMATOCRIT, BLOOD
HCT: 27 % — ABNORMAL LOW (ref 39.0–52.0)
Hemoglobin: 9 g/dL — ABNORMAL LOW (ref 13.0–17.0)

## 2023-05-08 LAB — HEPARIN LEVEL (UNFRACTIONATED)
Heparin Unfractionated: 0.8 [IU]/mL — ABNORMAL HIGH (ref 0.30–0.70)
Heparin Unfractionated: 0.71 [IU]/mL — ABNORMAL HIGH (ref 0.30–0.70)

## 2023-05-08 LAB — MAGNESIUM: Magnesium: 1.7 mg/dL (ref 1.7–2.4)

## 2023-05-08 MED ORDER — HYDROMORPHONE HCL 1 MG/ML IJ SOLN: 1.0000 mg | INTRAMUSCULAR | Status: DC | PRN

## 2023-05-08 MED ORDER — HYDROMORPHONE HCL 2 MG PO TABS
2.0000 mg | ORAL_TABLET | Freq: Four times a day (QID) | ORAL | Status: DC | PRN
  Administered 2023-05-20: 2 mg via ORAL
  Filled 2023-05-08 (×2): qty 1

## 2023-05-08 MED ORDER — MAGNESIUM SULFATE 2 GM/50ML IV SOLN: 2.0000 g | Freq: Once | INTRAVENOUS | Status: DC

## 2023-05-08 MED ORDER — ARFORMOTEROL TARTRATE 15 MCG/2ML IN NEBU
15.0000 ug | INHALATION_SOLUTION | Freq: Two times a day (BID) | RESPIRATORY_TRACT | Status: DC
  Filled 2023-05-08 (×2): qty 2

## 2023-05-08 MED ORDER — HYDROMORPHONE HCL 2 MG PO TABS
2.0000 mg | ORAL_TABLET | Freq: Once | ORAL | Status: DC
  Filled 2023-05-08: qty 1

## 2023-05-08 MED ORDER — HYDROMORPHONE HCL 2 MG PO TABS: 2.0000 mg | ORAL_TABLET | Freq: Four times a day (QID) | ORAL | Status: DC | PRN

## 2023-05-08 MED ORDER — FUROSEMIDE 40 MG PO TABS
80.0000 mg | ORAL_TABLET | Freq: Every day | ORAL | Status: AC
Start: 2023-05-08 — End: 2023-05-10
  Administered 2023-05-08 – 2023-05-09 (×2): 80 mg via ORAL
  Filled 2023-05-08 (×2): qty 2

## 2023-05-08 MED ORDER — ACETAMINOPHEN 325 MG PO TABS
650.0000 mg | ORAL_TABLET | Freq: Three times a day (TID) | ORAL | Status: AC
Start: 2023-05-08 — End: 2023-05-11
  Administered 2023-05-10 – 2023-05-11 (×2): 650 mg via ORAL
  Filled 2023-05-08 (×8): qty 2

## 2023-05-08 MED ORDER — ACETAMINOPHEN 325 MG PO TABS
650.0000 mg | ORAL_TABLET | Freq: Four times a day (QID) | ORAL | Status: DC | PRN
  Administered 2023-05-15 – 2023-05-22 (×4): 650 mg via ORAL
  Filled 2023-05-08 (×7): qty 2

## 2023-05-08 MED ORDER — APIXABAN 2.5 MG PO TABS
2.5000 mg | ORAL_TABLET | Freq: Two times a day (BID) | ORAL | Status: DC
  Administered 2023-05-08 – 2023-05-25 (×32): 2.5 mg via ORAL
  Filled 2023-05-08 (×34): qty 1

## 2023-05-08 MED ORDER — PREDNISONE 20 MG PO TABS
40.0000 mg | ORAL_TABLET | Freq: Every day | ORAL | Status: AC
  Administered 2023-05-08 – 2023-05-10 (×3): 40 mg via ORAL
  Filled 2023-05-08 (×3): qty 2

## 2023-05-08 MED ORDER — FLUTICASONE FUROATE-VILANTEROL 200-25 MCG/ACT IN AEPB
1.0000 | INHALATION_SPRAY | Freq: Every day | RESPIRATORY_TRACT | Status: DC
  Administered 2023-05-10 – 2023-05-11 (×2): 1 via RESPIRATORY_TRACT
  Filled 2023-05-08: qty 28

## 2023-05-08 NOTE — Plan of Care (Signed)
 Problem: Education: Goal: Knowledge of risk factors and measures for prevention of condition will improve 05/08/2023 0257 by Danford Bad, RN Outcome: Progressing 05/08/2023 0255 by Danford Bad, RN Outcome: Progressing   Problem: Coping: Goal: Psychosocial and spiritual needs will be supported 05/08/2023 0257 by Danford Bad, RN Outcome: Progressing 05/08/2023 0255 by Danford Bad, RN Outcome: Progressing   Problem: Respiratory: Goal: Will maintain a patent airway 05/08/2023 0257 by Danford Bad, RN Outcome: Progressing 05/08/2023 0255 by Danford Bad, RN Outcome: Progressing Goal: Complications related to the disease process, condition or treatment will be avoided or minimized 05/08/2023 0257 by Danford Bad, RN Outcome: Progressing 05/08/2023 0255 by Danford Bad, RN Outcome: Progressing   Problem: Education: Goal: Knowledge of General Education information will improve Description: Including pain rating scale, medication(s)/side effects and non-pharmacologic comfort measures 05/08/2023 0257 by Danford Bad, RN Outcome: Progressing 05/08/2023 0255 by Danford Bad, RN Outcome: Progressing   Problem: Health Behavior/Discharge Planning: Goal: Ability to manage health-related needs will improve 05/08/2023 0257 by Danford Bad, RN Outcome: Progressing 05/08/2023 0255 by Danford Bad, RN Outcome: Progressing   Problem: Clinical Measurements: Goal: Ability to maintain clinical measurements within normal limits will improve 05/08/2023 0257 by Danford Bad, RN Outcome: Progressing 05/08/2023 0255 by Danford Bad, RN Outcome: Progressing Goal: Will remain free from infection 05/08/2023 0257 by Danford Bad, RN Outcome: Progressing 05/08/2023 0255 by Danford Bad, RN Outcome: Progressing Goal: Diagnostic test results will improve 05/08/2023 0257 by Danford Bad, RN Outcome:  Progressing 05/08/2023 0255 by Danford Bad, RN Outcome: Progressing Goal: Respiratory complications will improve 05/08/2023 0257 by Danford Bad, RN Outcome: Progressing 05/08/2023 0255 by Danford Bad, RN Outcome: Progressing Goal: Cardiovascular complication will be avoided 05/08/2023 0257 by Danford Bad, RN Outcome: Progressing 05/08/2023 0255 by Danford Bad, RN Outcome: Progressing   Problem: Activity: Goal: Risk for activity intolerance will decrease 05/08/2023 0257 by Danford Bad, RN Outcome: Progressing 05/08/2023 0255 by Danford Bad, RN Outcome: Progressing   Problem: Nutrition: Goal: Adequate nutrition will be maintained 05/08/2023 0257 by Danford Bad, RN Outcome: Progressing 05/08/2023 0255 by Danford Bad, RN Outcome: Progressing   Problem: Coping: Goal: Level of anxiety will decrease 05/08/2023 0257 by Danford Bad, RN Outcome: Progressing 05/08/2023 0255 by Danford Bad, RN Outcome: Progressing   Problem: Elimination: Goal: Will not experience complications related to bowel motility 05/08/2023 0257 by Danford Bad, RN Outcome: Progressing 05/08/2023 0255 by Danford Bad, RN Outcome: Progressing Goal: Will not experience complications related to urinary retention 05/08/2023 0257 by Danford Bad, RN Outcome: Progressing 05/08/2023 0255 by Danford Bad, RN Outcome: Progressing   Problem: Pain Managment: Goal: General experience of comfort will improve and/or be controlled 05/08/2023 0257 by Danford Bad, RN Outcome: Progressing 05/08/2023 0255 by Danford Bad, RN Outcome: Progressing   Problem: Safety: Goal: Ability to remain free from injury will improve 05/08/2023 0257 by Danford Bad, RN Outcome: Progressing 05/08/2023 0255 by Danford Bad, RN Outcome: Progressing   Problem: Skin Integrity: Goal: Risk for impaired skin integrity will  decrease 05/08/2023 0257 by Danford Bad, RN Outcome: Progressing 05/08/2023 0255 by Danford Bad, RN Outcome: Progressing   Problem: Education: Goal: Ability to describe self-care measures that may prevent or decrease complications (Diabetes Survival Skills Education) will improve 05/08/2023 0257 by Danford Bad, RN Outcome: Progressing 05/08/2023 0255 by  Danford Bad, RN Outcome: Progressing Goal: Individualized Educational Video(s) 05/08/2023 0257 by Danford Bad, RN Outcome: Progressing 05/08/2023 0255 by Danford Bad, RN Outcome: Progressing   Problem: Coping: Goal: Ability to adjust to condition or change in health will improve 05/08/2023 0257 by Danford Bad, RN Outcome: Progressing 05/08/2023 0255 by Danford Bad, RN Outcome: Progressing   Problem: Health Behavior/Discharge Planning: Goal: Ability to identify and utilize available resources and services will improve 05/08/2023 0257 by Danford Bad, RN Outcome: Progressing 05/08/2023 0255 by Danford Bad, RN Outcome: Progressing Goal: Ability to manage health-related needs will improve 05/08/2023 0257 by Danford Bad, RN Outcome: Progressing 05/08/2023 0255 by Danford Bad, RN Outcome: Progressing   Problem: Fluid Volume: Goal: Ability to maintain a balanced intake and output will improve 05/08/2023 0257 by Danford Bad, RN Outcome: Progressing 05/08/2023 0255 by Danford Bad, RN Outcome: Progressing   Problem: Nutritional: Goal: Maintenance of adequate nutrition will improve 05/08/2023 0257 by Danford Bad, RN Outcome: Progressing 05/08/2023 0255 by Danford Bad, RN Outcome: Progressing Goal: Progress toward achieving an optimal weight will improve 05/08/2023 0257 by Danford Bad, RN Outcome: Progressing 05/08/2023 0255 by Danford Bad, RN Outcome: Progressing   Problem: Skin Integrity: Goal: Risk for impaired  skin integrity will decrease 05/08/2023 0257 by Danford Bad, RN Outcome: Progressing 05/08/2023 0255 by Danford Bad, RN Outcome: Progressing

## 2023-05-08 NOTE — Progress Notes (Signed)
 [facility]  ROUNDING NOTE   Subjective:   Kyle Roberson is a 83 y.o. male with diabetes mellitus type II, hypertension, coronary artery disease, chronic diastolic congestive heart failure, atrial fibrillation, pulmonary hypertension, and historyof subdural hematoma who is admitted for Weakness [R53.1] Fall, initial encounter [W19.XXXA] COVID-19 virus infection [U07.1] COVID-19 [U07.1] Uremia of renal origin [N19]  Patient is known to our practice and receives outpatient dialysis at [facility] on a MWF, supervised by Dr Cherylann Ratel. He received his last treatment on Wednesday.   Patient seen sitting up in bed Alert and oriented Received dialysis yesterday, no complaints Remains on room air   Objective:  Vital signs in last 24 hours:  Temp:  [97.6 F (36.4 C)-98.2 F (36.8 C)] 98 F (36.7 C) (02/15 0848) Pulse Rate:  [42-64] 62 (02/15 0848) Resp:  [18-20] 20 (02/15 0848) BP: (95-161)/(46-101) 128/49 (02/15 0848) SpO2:  [92 %-98 %] 92 % (02/15 0848) Weight:  [94.8 kg-96.8 kg] 94.8 kg (02/14 1824)  Weight change:  Filed Weights   05/06/23 0500 05/07/23 1456 05/07/23 1824  Weight: 98.4 kg 96.8 kg 94.8 kg    Intake/Output: I/O last 3 completed shifts: In: 465.2 [P.O.:360; I.V.:105.2] Out: 2000 [Other:2000]   Intake/Output this shift:  Total I/O In: 153.5 [I.V.:153.5] Out: -   Physical Exam: General: NAD  Head: Normocephalic, atraumatic. Moist oral mucosal membranes  Eyes: Anicteric  Lungs:  Clear to auscultation, room air  Heart: Regular rate and rhythm  Abdomen:  Soft, nontender  Extremities: 1+ peripheral edema.  Neurologic: Alert and oriented, moving all four extremities  Skin: Scaly bilateral lower extremities  Access: Right chest tunneled access    Basic Metabolic Panel: Recent Labs  Lab 05/05/23 1313 05/06/23 0019 05/06/23 0906 05/07/23 0749 05/08/23 0035  NA 141 140  --  141 137  K 4.7 4.6  --  5.4* 3.7  CL 101 103  --  103 99   CO2 18* 15*  --  15* 19*  GLUCOSE 203* 222*  --  100* 188*  BUN 102* 115*  --  137* 78*  CREATININE 10.02* 10.54*  --  11.98* 7.23*  CALCIUM 7.3* 7.0*  --  7.0* 7.5*  MG  --   --  1.9 1.9 1.7  PHOS  --   --  11.9* >30.0* 6.3*    Liver Function Tests: Recent Labs  Lab 05/05/23 1313  AST 24  ALT 26  ALKPHOS 51  BILITOT 0.8  PROT 6.4*  ALBUMIN 3.2*   Recent Labs  Lab 05/05/23 1313  LIPASE 60*   No results for input(s): "AMMONIA" in the last 168 hours.  CBC: Recent Labs  Lab 05/05/23 1313 05/06/23 0019 05/07/23 0749 05/07/23 1044 05/07/23 2002 05/08/23 0035  WBC 5.6 6.4 8.9  --   --  7.5  HGB 10.9* 10.6* 9.2* 9.4* 9.7* 8.8*  HCT 33.4* 31.5* 27.2* 29.3* 28.6* 25.5*  MCV 97.7 96.0 95.4  --   --  92.7  PLT 197 191 154  --   --  154    Cardiac Enzymes: No results for input(s): "CKTOTAL", "CKMB", "CKMBINDEX", "TROPONINI" in the last 168 hours.  BNP: Invalid input(s): "POCBNP"  CBG: Recent Labs  Lab 05/07/23 2234 05/07/23 2307 05/08/23 0149 05/08/23 0838 05/08/23 1152  GLUCAP 68* 113* 240* 127* 183*    Microbiology: Results for orders placed or performed during the hospital encounter of 05/05/23  Resp panel by RT-PCR (RSV, Flu A&B, Covid) Anterior Nasal Swab  Status: Abnormal   Collection Time: 05/05/23  4:33 PM   Specimen: Anterior Nasal Swab  Result Value Ref Range Status   SARS Coronavirus 2 by RT PCR POSITIVE (A) NEGATIVE Final    Comment: (NOTE) SARS-CoV-2 target nucleic acids are DETECTED.  The SARS-CoV-2 RNA is generally detectable in upper respiratory specimens during the acute phase of infection. Positive results are indicative of the presence of the identified virus, but do not rule out bacterial infection or co-infection with other pathogens not detected by the test. Clinical correlation with patient history and other diagnostic information is necessary to determine patient infection status. The expected result is Negative.  Fact  Sheet for Patients: BloggerCourse.com  Fact Sheet for Healthcare Providers: SeriousBroker.it  This test is not yet approved or cleared by the [place] FDA and  has been authorized for detection and/or diagnosis of SARS-CoV-2 by FDA under an Emergency Use Authorization (EUA).  This EUA will remain in effect (meaning this test can be used) for the duration of  the COVID-19 declaration under Section 564(b)(1) of the A ct, 21 U.S.C. section 360bbb-3(b)(1), unless the authorization is terminated or revoked sooner.     Influenza A by PCR NEGATIVE NEGATIVE Final   Influenza B by PCR NEGATIVE NEGATIVE Final    Comment: (NOTE) The Xpert Xpress SARS-CoV-2/FLU/RSV plus assay is intended as an aid in the diagnosis of influenza from Nasopharyngeal swab specimens and should not be used as a sole basis for treatment. Nasal washings and aspirates are unacceptable for Xpert Xpress SARS-CoV-2/FLU/RSV testing.  Fact Sheet for Patients: BloggerCourse.com  Fact Sheet for Healthcare Providers: SeriousBroker.it  This test is not yet approved or cleared by the [place] FDA and has been authorized for detection and/or diagnosis of SARS-CoV-2 by FDA under an Emergency Use Authorization (EUA). This EUA will remain in effect (meaning this test can be used) for the duration of the COVID-19 declaration under Section 564(b)(1) of the Act, 21 U.S.C. section 360bbb-3(b)(1), unless the authorization is terminated or revoked.     Resp Syncytial Virus by PCR NEGATIVE NEGATIVE Final    Comment: (NOTE) Fact Sheet for Patients: BloggerCourse.com  Fact Sheet for Healthcare Providers: SeriousBroker.it  This test is not yet approved or cleared by the [place] FDA and has been authorized for detection and/or diagnosis of SARS-CoV-2 by FDA under an  Emergency Use Authorization (EUA). This EUA will remain in effect (meaning this test can be used) for the duration of the COVID-19 declaration under Section 564(b)(1) of the Act, 21 U.S.C. section 360bbb-3(b)(1), unless the authorization is terminated or revoked.  Performed at [facility], [address] [place], [place] [address]   C Difficile Quick Screen w PCR reflex     Status: Roberson   Collection Time: 05/05/23  7:35 PM   Specimen: STOOL  Result Value Ref Range Status   C Diff antigen NEGATIVE NEGATIVE Final   C Diff toxin NEGATIVE NEGATIVE Final   C Diff interpretation No C. difficile detected.  Final    Comment: Performed at [facility], [address] [place], [place] [address]    Coagulation Studies: Recent Labs    05/06/23 1434  LABPROT 16.7*  INR 1.3*    Urinalysis: No results for input(s): "COLORURINE", "LABSPEC", "PHURINE", "GLUCOSEU", "HGBUR", "BILIRUBINUR", "KETONESUR", "PROTEINUR", "UROBILINOGEN", "NITRITE", "LEUKOCYTESUR" in the last 72 hours.  Invalid input(s): "APPERANCEUR"    Imaging: ECHOCARDIOGRAM COMPLETE Result Date: 05/07/2023    ECHOCARDIOGRAM REPORT   Patient Name:   Kyle  Calzada Date of Exam: 05/07/2023 Medical Rec #:  [id number]         Height:       69.0 in Accession #:    [id number]        Weight:       216.9 lb Date of Birth:  [birth year]        BSA:          2.139 m Patient Age:    82 years          BP:           10/48 mmHg Patient Gender: M                 HR:           44 bpm. Exam Location:  [facility] Procedure: 2D Echo, Cardiac Doppler and Color Doppler (Both Spectral and Color            Flow Doppler were utilized during procedure). Indications:     NSTEMI I21.4  History:         Patient has prior history of Echocardiogram examinations. CAD;                  Risk Factors:Hypertension.  Sonographer:     Neysa Bonito Roar Referring Phys:  [id number] Antoni[address] Iba Diagnosing Phys: Debbe Odea MD IMPRESSIONS  1. Left ventricular  ejection fraction, by estimation, is 55 to 60%. The left ventricle has normal function. The left ventricle has no regional wall motion abnormalities. There is mild left ventricular hypertrophy. Left ventricular diastolic parameters are consistent with Grade II diastolic dysfunction (pseudonormalization).  2. Right ventricular systolic function is normal. The right ventricular size is normal. There is severely elevated pulmonary artery systolic pressure.  3. Left atrial size was mildly dilated.  4. Right atrial size was moderately dilated.  5. The mitral valve is normal in structure. Mild mitral valve regurgitation.  6. Tricuspid valve regurgitation is moderate to severe.  7. The aortic valve is tricuspid. Aortic valve regurgitation is mild. Aortic valve sclerosis/calcification is present, without any evidence of aortic stenosis. Aortic valve mean gradient measures 6.0 mmHg.  8. The inferior vena cava is dilated in size with <50% respiratory variability, suggesting right atrial pressure of 15 mmHg. FINDINGS  Left Ventricle: Left ventricular ejection fraction, by estimation, is 55 to 60%. The left ventricle has normal function. The left ventricle has no regional wall motion abnormalities. Strain imaging was not performed. The left ventricular internal cavity  size was normal in size. There is mild left ventricular hypertrophy. Left ventricular diastolic parameters are consistent with Grade II diastolic dysfunction (pseudonormalization). Right Ventricle: The right ventricular size is normal. No increase in right ventricular wall thickness. Right ventricular systolic function is normal. There is severely elevated pulmonary artery systolic pressure. The tricuspid regurgitant velocity is 3.95 m/s, and with an assumed right atrial pressure of 15 mmHg, the estimated right ventricular systolic pressure is 77.4 mmHg. Left Atrium: Left atrial size was mildly dilated. Right Atrium: Right atrial size was moderately dilated.  Pericardium: There is no evidence of pericardial effusion. Mitral Valve: The mitral valve is normal in structure. Mild mitral valve regurgitation. MV peak gradient, 6.9 mmHg. The mean mitral valve gradient is 2.0 mmHg. Tricuspid Valve: The tricuspid valve is normal in structure. Tricuspid valve regurgitation is moderate to severe. Aortic Valve: The aortic valve is tricuspid. Aortic valve regurgitation is mild. Aortic regurgitation PHT measures 454 msec. Aortic valve sclerosis/calcification is present, without any evidence  of aortic stenosis. Aortic valve mean gradient measures 6.0  mmHg. Aortic valve peak gradient measures 13.9 mmHg. Aortic valve area, by VTI measures 1.23 cm. Pulmonic Valve: The pulmonic valve was normal in structure. Pulmonic valve regurgitation is trivial. Aorta: The aortic root and ascending aorta are structurally normal, with no evidence of dilitation. Venous: The inferior vena cava is dilated in size with less than 50% respiratory variability, suggesting right atrial pressure of 15 mmHg. IAS/Shunts: No atrial level shunt detected by color flow Doppler. Additional Comments: 3D imaging was not performed.  LEFT VENTRICLE PLAX 2D LVIDd:         5.10 cm   Diastology LVIDs:         3.50 cm   LV e' medial:    5.12 cm/s LV PW:         1.20 cm   LV E/e' medial:  22.5 LV IVS:        1.30 cm   LV e' lateral:   8.93 cm/s LVOT diam:     1.90 cm   LV E/e' lateral: 12.9 LV SV:         44 LV SV Index:   20 LVOT Area:     2.84 cm  RIGHT VENTRICLE RV Basal diam:  3.90 cm RV Mid diam:    3.50 cm RV S prime:     8.85 cm/s TAPSE (M-mode): 1.5 cm LEFT ATRIUM             Index        RIGHT ATRIUM           Index LA diam:        4.60 cm 2.15 cm/m   RA Area:     27.20 cm LA Vol (A2C):   64.6 ml 30.21 ml/m  RA Volume:   98.40 ml  46.01 ml/m LA Vol (A4C):   86.1 ml 40.26 ml/m LA Biplane Vol: 74.9 ml 35.02 ml/m  AORTIC VALVE                     PULMONIC VALVE AV Area (Vmax):    1.15 cm      PV Vmax:           1.09 m/s AV Area (Vmean):   1.14 cm      PV Peak grad:     4.8 mmHg AV Area (VTI):     1.23 cm      PR End Diast Vel: 7.84 msec AV Vmax:           186.50 cm/s   RVOT Peak grad:   1 mmHg AV Vmean:          112.500 cm/s AV VTI:            0.356 m AV Peak Grad:      13.9 mmHg AV Mean Grad:      6.0 mmHg LVOT Vmax:         75.80 cm/s LVOT Vmean:        45.300 cm/s LVOT VTI:          0.154 m LVOT/AV VTI ratio: 0.43 AI PHT:            454 msec  AORTA Ao Root diam: 2.60 cm Ao Asc diam:  3.70 cm MITRAL VALVE                TRICUSPID VALVE MV Area (PHT): 3.74 cm     TR Peak grad:  62.4 mmHg MV Area VTI:   1.00 cm     TR Vmax:        395.00 cm/s MV Peak grad:  6.9 mmHg MV Mean grad:  2.0 mmHg     SHUNTS MV Vmax:       1.31 m/s     Systemic VTI:  0.15 m MV Vmean:      67.8 cm/s    Systemic Diam: 1.90 cm MV Decel Time: 203 msec MV E velocity: 115.00 cm/s Debbe Odea MD Electronically signed by Debbe Odea MD Signature Date/Time: 05/07/2023/5:18:07 PM    Final      Medications:      apixaban  2.5 mg Oral BID   ascorbic acid  250 mg Oral Daily   calcium acetate  1,334 mg Oral TID WC   Chlorhexidine Gluconate Cloth  6 each Topical Daily   clotrimazole-betamethasone   Topical BID   dorzolamide-timolol  1 drop Both Eyes Daily   fluticasone furoate-vilanterol  1 puff Inhalation Daily   furosemide  80 mg Oral Daily   insulin aspart  0-5 Units Subcutaneous QHS   insulin aspart  0-9 Units Subcutaneous TID WC   multivitamin  1 tablet Oral QHS   neomycin-polymyxin b-dexamethasone  1 Application Right Eye TID   pantoprazole (PROTONIX) IV  40 mg Intravenous Q12H   predniSONE  40 mg Oral Q breakfast   rosuvastatin  10 mg Oral Once per day on Monday Wednesday Friday   acetaminophen, albuterol, dextromethorphan-guaiFENesin, heparin sodium (porcine), hydrALAZINE, loperamide, nitroGLYCERIN, ondansetron (ZOFRAN) IV  Assessment/ Plan:  Mr. [name] is a 83 y.o.  male with diabetes mellitus type  II, hypertension, coronary artery disease, chronic diastolic congestive heart failure, atrial fibrillation, pulmonary hypertension, and historyof subdural hematoma who is admitted for Weakness [R53.1] Fall, initial encounter [W19.XXXA] COVID-19 virus infection [U07.1] COVID-19 [U07.1] Uremia of renal origin [N19]  [facility]/MWF/right chest PermCath  End-stage renal disease on hemodialysis.  Patient received scheduled dialysis yesterday, UF 2 L.  Next treatment scheduled for Monday.  COVID-19, found positive this admission.  Supportive care.  Remains on room air  3. Anemia of chronic kidney disease  Lab Results  Component Value Date   HGB 8.8 (L) 05/08/2023    Hemoglobin 8.8.  Will consider low-dose EPO.  Patient receives Mircera at outpatient clinic.   4. Secondary Hyperparathyroidism: with outpatient labs: Roberson available  Lab Results  Component Value Date   PTH 344 (H) 01/07/2023   CALCIUM 7.5 (L) 05/08/2023   CAION 1.14 (L) 12/02/2022   PHOS 6.3 (H) 05/08/2023    Will continue to monitor bone minerals during this admission.  Serum calcium remains decreased but slowly improving since admission.  Hyperphosphatemia.  Phosphorus has improved.  Continue prescribed binders for now.   LOS: 1 Mikaele Stecher 2/15/20251:12 PM

## 2023-05-08 NOTE — Evaluation (Signed)
 Occupational Therapy Evaluation Patient Details Name: Kyle Roberson MRN: [id number] DOB: March 09, 1941 Today's Date: 05/08/2023   History of Present Illness   Kyle Roberson is a 83 y.o. male with medical history significant of ESRD-HD (MWF), HTN, HLD, DM, CAD with stent, dCHF, depression with anxiety, BPH, anemia, A-fib on Eliquis, left eye blindness, who presents with cough, shortness of breath, diarrhea, fall. COVID +     Clinical Impressions Pt was received in bed. PT/OT co-treatment completed for pt/therapist safety. Pt initially agreeable to sit up at EOB with encouragement, however, yelling out/cursing with BLE movement d/t pain. Pt unable to tolerate further movement d/t severe pain, declined pain medicine. Pt deferred using bed rails with BUE to attempt helping with supine to sit. Pt becoming increasingly agitated with attempts to encourage OOB mobility. He is unable/unwilling to participate in OT at this time. OT signing off.     If plan is discharge home, recommend the following:   Two people to help with bathing/dressing/bathroom;Two people to help with walking and/or transfers;Assistance with cooking/housework;Assist for transportation;Help with stairs or ramp for entrance     Functional Status Assessment   Patient has had a recent decline in their functional status and demonstrates the ability to make significant improvements in function in a reasonable and predictable amount of time.     Equipment Recommendations   None recommended by OT     Recommendations for Other Services         Precautions/Restrictions   Precautions Precautions: Fall Restrictions Weight Bearing Restrictions Per Provider Order: No     Mobility Bed Mobility Overal bed mobility: Needs Assistance             General bed mobility comments: able to slightly move LEs toward edge of bed with patient yelling and cursing due to pain. He does not tolerate any movement, nor  attempt to assist at all. Declined pain meds.    Transfers     General transfer comment: unable      Balance Overall balance assessment: History of Falls         ADL either performed or assessed with clinical judgement   ADL Overall ADL's : Needs assistance/impaired       General ADL Comments: Anticipate Min-Mod A for UB ADLs and Total A for LB ADLs.     Vision Patient Visual Report: No change from baseline Additional Comments: L eye blindness     Perception         Praxis         Pertinent Vitals/Pain Pain Assessment Pain Assessment: Faces Faces Pain Scale: Hurts worst Pain Location: B LEs with any movement or touch. Yelling out, swearing. Pain Intervention(s): Limited activity within patient's tolerance, Repositioned     Extremity/Trunk Assessment Upper Extremity Assessment Upper Extremity Assessment: Generalized weakness   Lower Extremity Assessment Lower Extremity Assessment: Generalized weakness RLE: Unable to fully assess due to pain RLE Coordination: decreased gross motor LLE: Unable to fully assess due to pain LLE Coordination: decreased gross motor       Communication Communication Communication: No apparent difficulties   Cognition Arousal: Alert Behavior During Therapy: Agitated, WFL for tasks assessed/performed               OT - Cognition Comments: Pt non-cooperative, adamantly refused EOB/OOB mobility despite encouragement.                 Following commands: Intact       Cueing  General Comments   Cueing  Techniques: Verbal cues      Exercises Other Exercises Other Exercises: OT provided education re: role of OT, OT POC, post acute recs, sitting up for all meals, EOB/OOB mobility with assistance, home/fall safety.     Shoulder Instructions      Home Living Family/patient expects to be discharged to:: Private residence Living Arrangements: Spouse/significant other Available Help at Discharge:  Family;Available 24 hours/day Type of Home: House Home Access: Ramped entrance     Home Layout: One level     Bathroom Shower/Tub: Producer, television/film/video: Standard     Home Equipment: Cane - quad;Shower seat;Grab bars - toilet;Grab bars - tub/shower;Hand held shower head;Wheelchair Financial trader (4 wheels)   Additional Comments: reports he was using rollator more lately      Prior Functioning/Environment Prior Level of Function : Needs assist;History of Falls (last six months)       Physical Assist : Mobility (physical);ADLs (physical) Mobility (physical): Transfers;Gait ADLs (physical): Bathing;Dressing;Grooming;Toileting;IADLs Mobility Comments: at baseline does not go out of house for mobility unless needed ADLs Comments: Wife assists with ADL (min-modA)    OT Problem List: Decreased strength;Decreased range of motion;Decreased activity tolerance;Impaired balance (sitting and/or standing);Impaired vision/perception;Decreased knowledge of use of DME or AE;Pain;Increased edema;Obesity   OT Treatment/Interventions:        OT Goals(Current goals can be found in the care plan section)   Acute Rehab OT Goals Patient Stated Goal: none stated OT Goal Formulation: All assessment and education complete, DC therapy Potential to Achieve Goals: Poor   OT Frequency:       Co-evaluation PT/OT/SLP Co-Evaluation/Treatment: Yes Reason for Co-Treatment: Necessary to address cognition/behavior during functional activity;For patient/therapist safety;To address functional/ADL transfers PT goals addressed during session: Mobility/safety with mobility OT goals addressed during session: ADL's and self-care      AM-PAC OT "6 Clicks" Daily Activity     Outcome Measure Help from another person eating meals?: A Little Help from another person taking care of personal grooming?: A Lot Help from another person toileting, which includes using toliet, bedpan, or urinal?:  Total Help from another person bathing (including washing, rinsing, drying)?: Total Help from another person to put on and taking off regular upper body clothing?: A Lot Help from another person to put on and taking off regular lower body clothing?: Total 6 Click Score: 10   End of Session Nurse Communication: Mobility status  Activity Tolerance: Patient limited by pain; Treatment limited secondary to agitation  Patient left: in bed;with call bell/phone within reach;with bed alarm set  OT Visit Diagnosis: Other abnormalities of gait and mobility (R26.89);Pain Pain - Right/Left: Left Pain - part of body: Leg (BLE)                Time: 4098-1191 OT Time Calculation (min): 11 min Charges:  OT General Charges $OT Visit: 1 Visit OT Evaluation $OT Eval Low Complexity: 1 Low  [facility] MS, OTR/L ascom [phone]  05/08/23, 11:48 AM

## 2023-05-08 NOTE — Plan of Care (Signed)
  Problem: Education: Goal: Knowledge of risk factors and measures for prevention of condition will improve Outcome: Progressing   Problem: Coping: Goal: Psychosocial and spiritual needs will be supported Outcome: Progressing   Problem: Respiratory: Goal: Will maintain a patent airway Outcome: Progressing Goal: Complications related to the disease process, condition or treatment will be avoided or minimized Outcome: Progressing   Problem: Education: Goal: Knowledge of General Education information will improve Description: Including pain rating scale, medication(s)/side effects and non-pharmacologic comfort measures Outcome: Progressing   Problem: Health Behavior/Discharge Planning: Goal: Ability to manage health-related needs will improve Outcome: Progressing   Problem: Clinical Measurements: Goal: Ability to maintain clinical measurements within normal limits will improve Outcome: Progressing Goal: Will remain free from infection Outcome: Progressing Goal: Diagnostic test results will improve Outcome: Progressing Goal: Respiratory complications will improve Outcome: Progressing Goal: Cardiovascular complication will be avoided Outcome: Progressing   Problem: Activity: Goal: Risk for activity intolerance will decrease Outcome: Progressing   Problem: Nutrition: Goal: Adequate nutrition will be maintained Outcome: Progressing   Problem: Coping: Goal: Level of anxiety will decrease Outcome: Progressing   Problem: Elimination: Goal: Will not experience complications related to bowel motility Outcome: Progressing Goal: Will not experience complications related to urinary retention Outcome: Progressing   Problem: Pain Managment: Goal: General experience of comfort will improve and/or be controlled Outcome: Progressing   Problem: Safety: Goal: Ability to remain free from injury will improve Outcome: Progressing   Problem: Skin Integrity: Goal: Risk for impaired  skin integrity will decrease Outcome: Progressing   Problem: Education: Goal: Ability to describe self-care measures that may prevent or decrease complications (Diabetes Survival Skills Education) will improve Outcome: Progressing Goal: Individualized Educational Video(s) Outcome: Progressing   Problem: Coping: Goal: Ability to adjust to condition or change in health will improve Outcome: Progressing   Problem: Fluid Volume: Goal: Ability to maintain a balanced intake and output will improve Outcome: Progressing   Problem: Health Behavior/Discharge Planning: Goal: Ability to identify and utilize available resources and services will improve Outcome: Progressing Goal: Ability to manage health-related needs will improve Outcome: Progressing   Problem: Metabolic: Goal: Ability to maintain appropriate glucose levels will improve Outcome: Progressing   Problem: Skin Integrity: Goal: Risk for impaired skin integrity will decrease Outcome: Progressing   Problem: Nutritional: Goal: Maintenance of adequate nutrition will improve Outcome: Progressing Goal: Progress toward achieving an optimal weight will improve Outcome: Progressing   Problem: Tissue Perfusion: Goal: Adequacy of tissue perfusion will improve Outcome: Progressing

## 2023-05-08 NOTE — Progress Notes (Signed)
 PHARMACY - ANTICOAGULATION CONSULT NOTE  Pharmacy Consult for Heparin Infusion Indication: chest pain/ACS  Patient Measurements: Height: 5\' 9"  (175.3 cm) Weight: 94.8 kg (208 lb 15.9 oz) IBW/kg (Calculated) : 70.7 Heparin Dosing Weight: 91.4 kg  Vital Signs: Temp: 97.6 F (36.4 C) (02/14 2212) Temp Source: Oral (02/14 2212) BP: 126/47 (02/14 2212) Pulse Rate: 57 (02/14 2212)  Labs: Recent Labs    05/06/23 0019 05/06/23 0906 05/06/23 1201 05/06/23 1434 05/06/23 1434 05/06/23 2155 05/07/23 0749 05/07/23 1044 05/07/23 2002 05/08/23 0035  HGB 10.6*  --   --   --   --   --  9.2* 9.4* 9.7* 8.8*  HCT 31.5*  --   --   --   --   --  27.2* 29.3* 28.6* 25.5*  PLT 191  --   --   --   --   --  154  --   --  154  APTT  --   --   --  52*   < > 161* 98*  --   --  88*  LABPROT  --   --   --  16.7*  --   --   --   --   --   --   INR  --   --   --  1.3*  --   --   --   --   --   --   HEPARINUNFRC  --   --   --  >1.10*  --   --  >1.10*  --   --  0.80*  CREATININE 10.54*  --   --   --   --   --  11.98*  --   --  7.23*  TROPONINIHS 97*   < > 680* 894*  --   --  728*  --   --   --    < > = values in this interval not displayed.    Estimated Creatinine Clearance: 8.6 mL/min (A) (by C-G formula based on SCr of 7.23 mg/dL (H)).   Medical History: Past Medical History:  Diagnosis Date   AKI (acute kidney injury) (HCC) 07/06/2018   CAD (coronary artery disease)    a. Remote PCI/stenting to LAD w PTCA Diagnoal. RCA 60%; b.  2005/2008 Cardiolites w/ reportedly mild ischemia in Diag territory-->Med rx.   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 04/2019 Echo: EF 55-60%, no rwma, nl RV fxn, RVSP 55.4mmHg. Mod dil LA. Mild-mod MR. Mod dil PA.   CKD (chronic kidney disease), stage IV (HCC)    Diabetes (HCC)    GERD (gastroesophageal reflux disease)    History of MI (myocardial infarction) 06/27/2014   HLD  (hyperlipidemia)    HTN (hypertension)    PAH (pulmonary artery hypertension) (HCC)    a. 04/2019 Echo: RVSP 55.66mmHg.   Permanent atrial fibrillation (HCC)    a. CHA2DS2VASc = 5-->dose adjusted eliquis (age/creat).   Subdural hematoma (HCC)    a. 01/2021 in setting of fall s/p L frontotemporal craniotomy.    Medications:  Patient on apixaban- last dose 2/13 @ 0825  Assessment: Patient is a 83 year old male with a past medical history of ESRD-HD (MWF), HTN, HLD, DM, CAD with stent, dCHF, depression with anxiety, BPH, anemia, A-fib on Eliquis, and left eye blindness who presented to ED with concerns of cough, SOB, diarrhea, and fall. Troponin 95>105>89 initially on admission. Troponin now 377>680.  Pharmacy has been consulted to initiate patient on a heparin infusion for ACS/STEMI.  Baseline aPTT, INR, and HL ordered.   No signs/symptoms of bleeding noted in chart. Hgb 10.6. PLT 191.  Goal of Therapy:  Heparin level 0.3-0.7 units/ml aPTT 66-102 seconds Monitor platelets by anticoagulation protocol: Yes  Date/Time: aPTT / HL: Comment / Rate: 2/13@2155  161 / --  SUPRAtherapeutic@1050  units/hr 2/14@0749  aPTT=98 Therapeutic x 1; held fecal occult positive  **Heparin infusion/consult discontinued on 2/14 at 1354 due to concern for possible GI bleed (FOBT positive, questionable drop in hgb) GI was consulted and has cleared patient to resume anticoagulation  2/15@0035  88 / 0.80 Therapeutic x 1   Plan:  Will continue heparin infusion rate of of 800 units/hour and continue for 24 hours (48 hours total per MD) Check aPTT/HL level in 8 hours(continue to use aPTT until both levels correlate, then will transition to HL dosing) Monitor CBC and signs/symptoms of bleeding  Thank you for involving pharmacy in this patient's care.   Bettey Costa, PharmD Clinical Pharmacist 05/08/2023 1:31 AM

## 2023-05-08 NOTE — Progress Notes (Signed)
 GI Inpatient Follow-up Note  Subjective:  Patient seen in follow-up for questionable melanotic stool and heme positive stool. No acute events overnight. No family at bedside. Hemoglobin 8.8 this morning. No overt gastrointestinal blood loss. No new complaints. He is wondering when he will be able to go home.   Scheduled Inpatient Medications:   ascorbic acid  250 mg Oral Daily   calcium acetate  1,334 mg Oral TID WC   Chlorhexidine Gluconate Cloth  6 each Topical Daily   clotrimazole-betamethasone   Topical BID   dorzolamide-timolol  1 drop Both Eyes Daily   insulin aspart  0-5 Units Subcutaneous QHS   insulin aspart  0-9 Units Subcutaneous TID WC   multivitamin  1 tablet Oral QHS   neomycin-polymyxin b-dexamethasone  1 Application Right Eye TID   pantoprazole (PROTONIX) IV  40 mg Intravenous Q12H   rosuvastatin  10 mg Oral Once per day on Monday Wednesday Friday    Continuous Inpatient Infusions:    heparin 800 Units/hr (05/07/23 1728)    PRN Inpatient Medications:  acetaminophen, albuterol, dextromethorphan-guaiFENesin, heparin sodium (porcine), hydrALAZINE, loperamide, nitroGLYCERIN, ondansetron (ZOFRAN) IV  Review of Systems: Constitutional: Weight is stable.  Eyes: No changes in vision. ENT: No oral lesions, sore throat.  GI: see HPI.  Heme/Lymph: No easy bruising.  CV: No chest pain.  GU: No hematuria.  Integumentary: No rashes.  Neuro: No headaches.  Psych: No depression/anxiety.  Endocrine: No heat/cold intolerance.  Allergic/Immunologic: No urticaria.  Resp: No cough, SOB.  Musculoskeletal: No joint swelling.    Physical Examination: BP (!) 128/49 (BP Location: Right Arm)   Pulse 62   Temp 98 F (36.7 C) (Oral)   Resp 20   Ht 5\' 9"  (1.753 m)   Wt 94.8 kg   SpO2 92%   BMI 30.86 kg/m  Gen: NAD, alert and oriented x 4 HEENT: PEERLA, EOMI, Neck: supple, no JVD or thyromegaly Chest: CTA bilaterally, no wheezes, crackles, or other adventitious  sounds CV: RRR, no m/g/c/r Abd: soft, NT, ND, +BS in all four quadrants; no HSM, guarding, ridigity, or rebound tenderness Ext: no edema, well perfused with 2+ pulses, Skin: no rash or lesions noted Lymph: no LAD  Data: Lab Results  Component Value Date   WBC 7.5 05/08/2023   HGB 8.8 (L) 05/08/2023   HCT 25.5 (L) 05/08/2023   MCV 92.7 05/08/2023   PLT 154 05/08/2023   Recent Labs  Lab 05/07/23 1044 05/07/23 2002 05/08/23 0035  HGB 9.4* 9.7* 8.8*   Lab Results  Component Value Date   NA 137 05/08/2023   K 3.7 05/08/2023   CL 99 05/08/2023   CO2 19 (L) 05/08/2023   BUN 78 (H) 05/08/2023   CREATININE 7.23 (H) 05/08/2023   GLU 139 07/22/2018   Lab Results  Component Value Date   ALT 26 05/05/2023   AST 24 05/05/2023   ALKPHOS 51 05/05/2023   BILITOT 0.8 05/05/2023   Recent Labs  Lab 05/06/23 1434 05/06/23 2155 05/08/23 0035  APTT 52*   < > 88*  INR 1.3*  --   --    < > = values in this interval not displayed.    Assessment/Plan:  83 y/o Caucasian male with a PMH of HTN, HLD, T2DM, CAD with stent, dCHF, depression, anxiety, anemia of chronic disease, PAF on chronic anticoagulation, left eye blindness, and ESRD on HD admitted 2/12 for chief complaint of cough, shortness of breath, and diarrhea found to have COVID-19 infection. GI consulted  2/14 for possible GIB and heme positive stool.   Acute diarrhea - C Diff negative. Improving.  COVID-19 infection - on supportive care only for minimal symptoms.  Questionable melenic stool.  Acute on chronic Anemia - slight drop in Hgb, unclear if related to GIB vs. hemodilution from fluid overload. No overt GI bleeding.   ESRD  Elevated Troponin - Demand ischemia per cardiology with no acute ischemic findings.  CAD s/p coronary stenting.  Long term anticoagulation - Eliquis, held. ON IV Heparin, also held.    COVID-19 status: Tested positive                                        Recommendations:   Resume  anticoagulation. Serial H/H as you are doing. Will follow along and assess for any bleeding. ADAT No plans for endoluminal evaluation at this time GI will continue to follow along peripherally If there are concerns for GI bleeding or significant hemodynamic changes, please call Dr. Norma Fredrickson  Please call with questions or concerns.   Jacob Moores, PA-C [facility] [phone]

## 2023-05-08 NOTE — Consult Note (Signed)
 Consultation Note Date: 05/08/2023   Patient Name: Kyle Roberson  DOB: 11-Mar-1941  MRN: [id number]  Age / Sex: 83 y.o., male  PCP: Kyle Milan, MD Referring Physician: Gillis Santa, MD  Reason for Consultation: Establishing goals of care   HPI/Brief Hospital Course: 83 y.o. male  with past medical history of ESRD on HD MWF, HTN, HLD, T2DM, CAD s/p PCI, CHF, anemia, A. Fib on Eliquis, anxiety and depression admitted from home on 05/05/2023 with fall, diarrhea, cough and shortness of breath.   Found to be COVID (+) admitted for supportive care, elevated troponin likely due supply-demand, considering outpatient stress test versus cardiac catheterization, significant pulmonary HTN  Noted prolonged admit 9/5-10/14 in which he was closely followed by PMT at that time, discharged to [facility] and returned home 10/28  Palliative medicine was consulted for assisting with goals of care conversations.  Subjective:  Extensive chart review has been completed prior to meeting patient including labs, vital signs, imaging, progress notes, orders, and available advanced directive documents from current and previous encounters.  Visited with Kyle Roberson at his bedside. He is awake, alert, oriented and able to engage in conversations. No family at bedside during time of visit.  Introduced myself as a Publishing rights manager as a member of the palliative care team. Explained palliative medicine is specialized medical care for people living with serious illness. It focuses on providing relief from the symptoms and stress of a serious illness. The goal is to improve quality of life for both the patient and the family.   Kyle Roberson able to answer questions appropriately. He is able to recall events at home that led to hospitalization. He shares prior to recent fall that led to hospitalization he was able to ambulate around his home with assistance of a walker. He shares he  feels he has been doing well since discharging from CIR. He shares he has been tolerating HD sessions well without issues. He is aware of COVID diagnosis and that cardiology is also following along closely-recommending further outpatient work up.  We discussed patient's current illness and what it means in the larger context of patient's on-going co-morbidities. Natural disease trajectory and expectations at EOL were discussed.   We discussed code status and the difference between Full Code and Do Not Resuscitate. Encouraged Kyle Roberson to consider DNR/DNI status understanding evidenced based poor outcomes in similar hospitalized patients, as the cause of the arrest is likely associated with chronic/terminal disease rather than a reversible acute cardio-pulmonary event.  At this time, Kyle Roberson is clear that he wishes to remain Full Code. He shares he is ready to return home and continues to be interested in pursuing HD and all other aggressive interventions.  I discussed importance of continued conversations with family/support persons and all members of their medical team regarding overall plan of care and treatment options ensuring decisions are in alignment with patients goals of care.  All questions/concerns addressed. Emotional support provided to patient/family/support persons. PMT will continue to follow and support patient as needed.   Objective: Primary Diagnoses: Present on Admission:  COVID-19  Myocardial injury  Paroxysmal atrial fibrillation (HCC)  Essential hypertension  Dyslipidemia  CAD (coronary artery disease)  Type II diabetes mellitus with renal manifestations (HCC)  Diarrhea  Obesity (BMI 30-39.9)  Uremia of renal origin   Physical Exam Constitutional:      General: He is not in acute distress.    Appearance: He is ill-appearing.  Pulmonary:     Effort: Pulmonary  effort is normal. No respiratory distress.  Skin:    General: Skin is warm and dry.   Neurological:     Mental Status: He is alert and oriented to person, place, and time.     Motor: Weakness present.  Psychiatric:        Mood and Affect: Mood normal.        Thought Content: Thought content normal.     Vital Signs: BP (!) 128/49 (BP Location: Right Arm)   Pulse 62   Temp 98 F (36.7 C) (Oral)   Resp 20   Ht 5\' 9"  (1.753 m)   Wt 94.8 kg   SpO2 92%   BMI 30.86 kg/m  Pain Scale: 0-10   Pain Score: 0-No pain  IO: Intake/output summary:  Intake/Output Summary (Last 24 hours) at 05/08/2023 1031 Last data filed at 05/07/2023 2200 Gross per 24 hour  Intake 406.83 ml  Output 2000 ml  Net -1593.17 ml    LBM: Last BM Date : 05/07/23 Baseline Weight: Weight: 95.3 kg Most recent weight: Weight: 94.8 kg      Assessment and Plan  SUMMARY OF RECOMMENDATIONS   Full Code High risk for decline PMT to continue to follow for ongoing needs and support  Palliative Prophylaxis:   Bowel Regimen, Delirium Protocol and Frequent Pain Assessment  Thank you for this consult and allowing Palliative Medicine to participate in the care of Kyle Roberson. Palliative medicine will continue to follow and assist as needed.   Time Total: 75 minutes  Time spent includes: Detailed review of medical records (labs, imaging, vital signs), medically appropriate exam (mental status, respiratory, cardiac, skin), discussed with treatment team, counseling and educating patient, family and staff, documenting clinical information, medication management and coordination of care.   Signed by: Kyle Deed, DNP, AGNP-C Palliative Medicine    Please contact Palliative Medicine Team phone at [phone] for questions and concerns.  For individual provider: See Loretha Stapler

## 2023-05-08 NOTE — Progress Notes (Signed)
 [facility] Progress Note  Patient: Kyle Roberson    BJY:[id number]  DOA: 05/05/2023     Date of Service: the patient was seen and examined on 05/08/2023  Chief Complaint  Patient presents with   Fall   Brief hospital course:  Kyle Roberson is a 83 y.o. male with medical history significant of ESRD-HD (MWF), HTN, HLD, DM, CAD with stent, dCHF, depression with anxiety, BPH, anemia, A-fib on Eliquis, left eye blindness, who presents with cough, shortness of breath, diarrhea, fall.   Per pt and his wife (I called his wife by phone), patient has productive cough, sore throat, SOB, malaise for more than 5 days.  No chest pain.  No fever or chills.  Patient has diarrhea for more than 10 days, with 2- 3 times of watery diarrhea each day.  No nausea, vomiting or abdominal pain.  Patient fell this morning, no loss of consciousness. He patient states that he was going to the bathroom when he suddenly felt weak in his gave out and fell.  No unilateral numbness or tingling in extremities.  No facial droop or slurred speech.  Patient states that he has missed several dialysis. His last dialysis was on Monday.     Data reviewed independently and ED Course: pt was found to have positive COVID 19, WBC 5.6, troponin 95 --> 105, potassium 4.7, bicarbonate 18, creatinine 10.02, BUN 102.  CT head negative. Chest x-ray showed cardiomegaly with vascular congestion.  Patient is placed on telemetry bed for observation.     EKG: I have personally reviewed.  A-fib, QTc 461, low voltage, poor IV progression, anteroseptal infarction pattern.   Assessment and Plan:  # COVID-19 virus infection: No fever, no leukocytosis, no oxygen desaturation.   Chest x-ray showed vascular congestion, no infiltration.  -Bronchodilators and as needed Mucinex -Supportive care 2/15 still sob after HD, may benefit from steroids and breathing treatments Started prednisone 40 mg p.o. daily for 3 days, Breo Ellipta  inhaler Continue albuterol as needed   # CAD (coronary artery disease) and Myocardial injury:  Troponin 894, but No chest pain and no significant EKG changes Continue to monitor on telemetry S/p ASA 81 mg and heparin IV infusion, discontinued on 2/14, due to GI bleed 2/14 continue to hold Eliquis for now Cardiology consulted, recommended no ischemic workup at this time, follow as an outpatient for Myoview when recovered after COVID infection TTE shows LVEF 55 to 60%, grade 2 diastolic dysfunction, no wall motion abnormality.  Severe pulmonary hypertension, RA moderately dilated.  Moderate TR As per cardio patient may need right and left heart cath due to pulmonary hypertension and elevated troponin.  # Paroxysmal atrial fibrillation: -Tele monitoring - s/p heparin IV infusion, Hb dropped, suspected GI bleed.  Patient was cleared by GI to continue heparin IV infusion.  2/15 resumed Eliquis 2.5 mg p.o. 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 2/15 Hb 8.8 stable   # ESRD on dialysis -consulted Dr. Cherylann Ratel of renal Continue hemodialysis MWF schedule Hyperphosphatemia, started PhosLo   Fall at home, initial encounter: CT-head negative -Fall precaution -PT/OT eval pending Follow CK level  Type II diabetes mellitus with renal manifestations:  Recent A1c 7.3, poorly controlled for  patient taking glipizide at home -SSI   Essential hypertension: Patient's not taking medications.   Nonsudden V. tach episode 7 beats run at 630 on 2/13 reported 2/13 started metoprolol 12.5 mg p.o. twice daily -IV hydralazine as needed   Chronic diastolic CHF, severe pulmonary hypertension TTE done as above 2/15  Lasix 80 mg x 2 doses ordered by cardiology Fluid management via hemodialysis   Dyslipidemia -Crestor   Diarrhea: Possibly due to COVID infection -As needed Imodium -C. 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  Interventions:  Diet: Renal diet DVT Prophylaxis: Heparin IV infusion  Advance goals of care discussion: Full code  Family Communication: family was not present at bedside, at the time of interview.  The pt provided permission to discuss medical plan with the family. Opportunity was given to ask question and all questions were answered satisfactorily.   Disposition:  Pt is from home, admitted with COVID viral infection, missed hemodialysis, elevated troponin s/p IV heparin infusion, which precludes a safe discharge. Discharge to SNF, when stable, may need few days to improve. PT and OT eval Follow TOC for placement   Subjective: No significant overnight events, patient did get hemodialysis yesterday, stated that he feels a little bit better afterwards.  Still has shortness of breath but unable to offer any complaints. Denied any chest pain or palpitations.  Patient says that he is having tenderness in the bilateral lower extremities, still not able to walk.  Physical Exam: General: Mild to moderate respiratory distress affect anxious and depressed  Eyes: PERRLA ENT: Oral Mucosa Clear, moist  Neck: no JVD,  Cardiovascular: S1 and S2 Present, no Murmur,  Respiratory: Equal air entry bilaterally, mild bibasilar crackles, no wheezes Abdomen: Bowel Sound present, Soft and no tenderness,  Skin: no rashes Extremities: Chronic lymphedema and chronic skin changes, dry scaly skin  Neurologic: without any new focal findings Gait not checked due to patient safety concerns  Vitals:   05/07/23 1824 05/07/23 2212 05/08/23 0151 05/08/23 0848  BP:   (!) 126/47 (!) 95/49 (!) 128/49  Pulse:  (!) 57 61 62  Resp:  18 18 20   Temp:  97.6 F (36.4 C) 97.6 F (36.4 C) 98 F (36.7 C)  TempSrc:  Oral Oral Oral  SpO2:  96% 97% 92%  Weight: 94.8 kg     Height:        Intake/Output Summary (Last 24 hours) at 05/08/2023 1334 Last data filed at 05/08/2023 1245 Gross per 24 hour  Intake 560.35 ml  Output 2000 ml  Net -1439.65 ml   Filed Weights   05/06/23 0500 05/07/23 1456 05/07/23 1824  Weight: 98.4 kg 96.8 kg 94.8 kg    Data Reviewed: I have personally reviewed and interpreted daily labs, tele strips, imagings as discussed above. I reviewed all nursing notes, pharmacy notes, vitals, pertinent old records I have discussed plan of care as described above with RN and patient/family.  CBC: Recent Labs  Lab 05/05/23 1313 05/06/23 0019 05/07/23 0749 05/07/23 1044 05/07/23 2002 05/08/23 0035  WBC 5.6 6.4 8.9  --   --  7.5  HGB 10.9* 10.6* 9.2* 9.4* 9.7* 8.8*  HCT 33.4* 31.5* 27.2* 29.3* 28.6* 25.5*  MCV 97.7 96.0 95.4  --   --  92.7  PLT 197 191 154  --   --  154   Basic Metabolic Panel: Recent Labs  Lab  05/05/23 1313 05/06/23 0019 05/06/23 0906 05/07/23 0749 05/08/23 0035  NA 141 140  --  141 137  K 4.7 4.6  --  5.4* 3.7  CL 101 103  --  103 99  CO2 18* 15*  --  15* 19*  GLUCOSE 203* 222*  --  100* 188*  BUN 102* 115*  --  137* 78*  CREATININE 10.02* 10.54*  --  11.98* 7.23*  CALCIUM 7.3* 7.0*  --  7.0* 7.5*  MG  --   --  1.9 1.9 1.7  PHOS  --   --  11.9* >30.0* 6.3*    Studies: No results found.   Scheduled Meds:  apixaban  2.5 mg Oral BID   ascorbic acid  250 mg Oral Daily   calcium acetate  1,334 mg Oral TID WC   Chlorhexidine Gluconate Cloth  6 each Topical Daily   clotrimazole-betamethasone   Topical BID   dorzolamide-timolol  1 drop Both Eyes Daily   fluticasone furoate-vilanterol  1 puff Inhalation Daily   furosemide  80 mg Oral Daily   insulin aspart  0-5 Units Subcutaneous QHS   insulin aspart   0-9 Units Subcutaneous TID WC   multivitamin  1 tablet Oral QHS   neomycin-polymyxin b-dexamethasone  1 Application Right Eye TID   pantoprazole (PROTONIX) IV  40 mg Intravenous Q12H   predniSONE  40 mg Oral Q breakfast   rosuvastatin  10 mg Oral Once per day on Monday Wednesday Friday   Continuous Infusions:   PRN Meds: acetaminophen, albuterol, dextromethorphan-guaiFENesin, heparin sodium (porcine), hydrALAZINE, loperamide, nitroGLYCERIN, ondansetron (ZOFRAN) IV  Time spent: 55 minutes  Author: Gillis Santa. MD [facility] 05/08/2023 1:34 PM  To reach On-call, see care teams to locate the attending and reach out to them via [email or web address]. If 7PM-7AM, please contact night-coverage If you still have difficulty reaching the attending provider, please page the [facility] (Director on Call) for [facility] on amion for assistance.

## 2023-05-08 NOTE — Progress Notes (Signed)
 Patient refusing lab draw, PTOT, and pain medication. This RN explained importance of each and why the MD ordered the pain medicine, and importance of PTOT to maintain strength. At this time patient is agreeable to lab draw and PTOT tomorrow, but patient states he will not take pain medicine. This RN contacted PTOT and phlebotomy to have them re-attempt. MD aware of above.

## 2023-05-08 NOTE — Evaluation (Signed)
 Physical Therapy Evaluation Patient Details Name: Kyle Roberson MRN: [id number] DOB: 1940/12/14 Today's Date: 05/08/2023  History of Present Illness  Kyle Roberson is a 83 y.o. male with medical history significant of ESRD-HD (MWF), HTN, HLD, DM, CAD with stent, dCHF, depression with anxiety, BPH, anemia, A-fib on Eliquis, left eye blindness, who presents with cough, shortness of breath, diarrhea, fall. COVID +   Clinical Impression  Patient received in bed. He is grouchy, but initially agreeable to PT/OT with encouragement. With any movement in bed of LEs to attempt to get to edge of bed patient is yelling and cursing due to pain. Patient not attempting to assist or participate at all. He is unable/unwilling to tolerate/participate in PT at this time due to severe LE pain with any movement or touching and declined pain medicine. PT signing off.         If plan is discharge home, recommend the following: Assist for transportation;Two people to help with walking and/or transfers;Two people to help with bathing/dressing/bathroom   Can travel by private vehicle    No    Equipment Recommendations None recommended by PT  Recommendations for Other Services       Functional Status Assessment Patient has had a recent decline in their functional status and/or demonstrates limited ability to make significant improvements in function in a reasonable and predictable amount of time     Precautions / Restrictions Precautions Precautions: Fall Recall of Precautions/Restrictions: Impaired Restrictions Weight Bearing Restrictions Per Provider Order: No      Mobility  Bed Mobility Overal bed mobility: Needs Assistance             General bed mobility comments: able to slightly move LEs toward edge of bed with patient yelling and cursing due to pain. He does not tolerate any movement, nor attempt to assist at all. Declined pain meds.    Transfers                    General transfer comment: unable    Ambulation/Gait               General Gait Details: unable  Stairs            Wheelchair Mobility     Tilt Bed    Modified Rankin (Stroke Patients Only)       Balance Overall balance assessment: History of Falls                                           Pertinent Vitals/Pain Pain Assessment Pain Assessment: Faces Faces Pain Scale: Hurts whole lot Pain Location: B LEs with any movement or touch. Yelling out, swearing. Pain Intervention(s): Limited activity within patient's tolerance, Repositioned    Home Living Family/patient expects to be discharged to:: Private residence Living Arrangements: Spouse/significant other Available Help at Discharge: Family;Available 24 hours/day Type of Home: House Home Access: Ramped entrance;Stairs to enter       Home Layout: One level Home Equipment: Cane - quad;Shower seat;Grab bars - toilet;Grab bars - tub/shower;Hand held shower head;Wheelchair Financial trader (4 wheels) Additional Comments: reports he was using rollator more lately    Prior Function Prior Level of Function : Needs assist       Physical Assist : Mobility (physical);ADLs (physical) Mobility (physical): Transfers;Gait ADLs (physical): Bathing;Dressing;Grooming;Toileting;IADLs Mobility Comments: at baseline does not go out of house for mobility unless  needed ADLs Comments: Wife assists with ADL (min-modA)     Extremity/Trunk Assessment   Upper Extremity Assessment Upper Extremity Assessment: Defer to OT evaluation    Lower Extremity Assessment Lower Extremity Assessment: RLE deficits/detail;LLE deficits/detail RLE: Unable to fully assess due to pain RLE Coordination: decreased gross motor LLE: Unable to fully assess due to pain LLE Coordination: decreased gross motor       Communication   Communication Communication: No apparent difficulties    Cognition Arousal:  Alert Behavior During Therapy: WFL for tasks assessed/performed                           PT - Cognition Comments: grouchy, poor motivation, poor cooperation Following commands: Intact       Cueing Cueing Techniques: Verbal cues     General Comments      Exercises     Assessment/Plan    PT Assessment Patient does not need any further PT services  PT Problem List         PT Treatment Interventions      PT Goals (Current goals can be found in the Care Plan section)  Acute Rehab PT Goals Patient Stated Goal: to go home PT Goal Formulation: With patient Time For Goal Achievement: 05/21/23 Potential to Achieve Goals: Poor    Frequency       Co-evaluation PT/OT/SLP Co-Evaluation/Treatment: Yes Reason for Co-Treatment: Necessary to address cognition/behavior during functional activity;For patient/therapist safety;To address functional/ADL transfers PT goals addressed during session: Mobility/safety with mobility         AM-PAC PT "6 Clicks" Mobility  Outcome Measure Help needed turning from your back to your side while in a flat bed without using bedrails?: Total Help needed moving from lying on your back to sitting on the side of a flat bed without using bedrails?: Total Help needed moving to and from a bed to a chair (including a wheelchair)?: Total Help needed standing up from a chair using your arms (e.g., wheelchair or bedside chair)?: Total Help needed to walk in hospital room?: Total Help needed climbing 3-5 steps with a railing? : Total 6 Click Score: 6    End of Session   Activity Tolerance: Patient limited by pain;Treatment limited secondary to agitation Patient left: in bed;with call bell/phone within reach;with bed alarm set Nurse Communication: Mobility status PT Visit Diagnosis: Muscle weakness (generalized) (M62.81);Difficulty in walking, not elsewhere classified (R26.2);Other abnormalities of gait and mobility (R26.89)    Time:  1003-1013 PT Time Calculation (min) (ACUTE ONLY): 10 min   Charges:   PT Evaluation $PT Eval Low Complexity: 1 Low   PT General Charges $$ ACUTE PT VISIT: 1 Visit         Katlyne Nishida, PT, GCS 05/08/23,10:25 AM

## 2023-05-09 DIAGNOSIS — U071 COVID-19: Secondary | ICD-10-CM | POA: Diagnosis not present

## 2023-05-09 DIAGNOSIS — R531 Weakness: Secondary | ICD-10-CM | POA: Diagnosis not present

## 2023-05-09 DIAGNOSIS — I2489 Other forms of acute ischemic heart disease: Secondary | ICD-10-CM | POA: Diagnosis not present

## 2023-05-09 DIAGNOSIS — Z515 Encounter for palliative care: Secondary | ICD-10-CM | POA: Diagnosis not present

## 2023-05-09 LAB — CBC
HCT: 25.4 % — ABNORMAL LOW (ref 39.0–52.0)
Hemoglobin: 8.5 g/dL — ABNORMAL LOW (ref 13.0–17.0)
MCH: 31.8 pg (ref 26.0–34.0)
MCHC: 33.5 g/dL (ref 30.0–36.0)
MCV: 95.1 fL (ref 80.0–100.0)
Platelets: 155 10*3/uL (ref 150–400)
RBC: 2.67 MIL/uL — ABNORMAL LOW (ref 4.22–5.81)
RDW: 13.9 % (ref 11.5–15.5)
WBC: 5 10*3/uL (ref 4.0–10.5)
nRBC: 0 % (ref 0.0–0.2)

## 2023-05-09 LAB — MAGNESIUM: Magnesium: 1.9 mg/dL (ref 1.7–2.4)

## 2023-05-09 LAB — BASIC METABOLIC PANEL
Anion gap: 21 — ABNORMAL HIGH (ref 5–15)
BUN: 96 mg/dL — ABNORMAL HIGH (ref 8–23)
CO2: 19 mmol/L — ABNORMAL LOW (ref 22–32)
Calcium: 7.7 mg/dL — ABNORMAL LOW (ref 8.9–10.3)
Chloride: 98 mmol/L (ref 98–111)
Creatinine, Ser: 9.05 mg/dL — ABNORMAL HIGH (ref 0.61–1.24)
GFR, Estimated: 5 mL/min — ABNORMAL LOW (ref >60)
Glucose, Bld: 181 mg/dL — ABNORMAL HIGH (ref 70–99)
Potassium: 4.4 mmol/L (ref 3.5–5.1)
Sodium: 138 mmol/L (ref 135–145)

## 2023-05-09 LAB — GLUCOSE, CAPILLARY
Glucose-Capillary: 146 mg/dL — ABNORMAL HIGH (ref 70–99)
Glucose-Capillary: 233 mg/dL — ABNORMAL HIGH (ref 70–99)
Glucose-Capillary: 272 mg/dL — ABNORMAL HIGH (ref 70–99)
Glucose-Capillary: 253 mg/dL — ABNORMAL HIGH (ref 70–99)

## 2023-05-09 LAB — CK: Total CK: 75 U/L (ref 49–397)

## 2023-05-09 LAB — PHOSPHORUS: Phosphorus: 8.5 mg/dL — ABNORMAL HIGH (ref 2.5–4.6)

## 2023-05-09 MED ORDER — ARFORMOTEROL TARTRATE 15 MCG/2ML IN NEBU
15.0000 ug | INHALATION_SOLUTION | Freq: Two times a day (BID) | RESPIRATORY_TRACT | Status: DC
Start: 2023-05-09 — End: 2023-05-12
  Administered 2023-05-10 – 2023-05-11 (×3): 15 ug via RESPIRATORY_TRACT
  Filled 2023-05-09 (×6): qty 2

## 2023-05-09 MED ORDER — BUDESONIDE 0.25 MG/2ML IN SUSP
0.2500 mg | Freq: Two times a day (BID) | RESPIRATORY_TRACT | Status: DC
  Administered 2023-05-10 – 2023-05-11 (×3): 0.25 mg via RESPIRATORY_TRACT
  Filled 2023-05-09 (×5): qty 2

## 2023-05-09 MED ORDER — QUETIAPINE FUMARATE 25 MG PO TABS
25.0000 mg | ORAL_TABLET | Freq: Two times a day (BID) | ORAL | Status: DC
  Administered 2023-05-09 – 2023-05-14 (×10): 25 mg via ORAL
  Filled 2023-05-09 (×10): qty 1

## 2023-05-09 MED ORDER — EPOETIN ALFA-EPBX 4000 UNIT/ML IJ SOLN
4000.0000 [IU] | INTRAMUSCULAR | Status: DC
  Administered 2023-05-12 – 2023-05-24 (×6): 4000 [IU] via INTRAVENOUS
  Filled 2023-05-09 (×4): qty 1

## 2023-05-09 MED ORDER — IPRATROPIUM-ALBUTEROL 0.5-2.5 (3) MG/3ML IN SOLN
3.0000 mL | Freq: Four times a day (QID) | RESPIRATORY_TRACT | Status: DC
  Administered 2023-05-10 (×3): 3 mL via RESPIRATORY_TRACT
  Filled 2023-05-09 (×4): qty 3

## 2023-05-09 MED ORDER — TRAZODONE HCL 50 MG PO TABS
50.0000 mg | ORAL_TABLET | Freq: Every day | ORAL | Status: DC
  Administered 2023-05-10 – 2023-05-22 (×9): 50 mg via ORAL
  Filled 2023-05-09 (×16): qty 1

## 2023-05-09 NOTE — Plan of Care (Signed)
  Problem: Education: Goal: Knowledge of risk factors and measures for prevention of condition will improve Outcome: Progressing   Problem: Coping: Goal: Psychosocial and spiritual needs will be supported Outcome: Progressing   Problem: Respiratory: Goal: Will maintain a patent airway Outcome: Progressing Goal: Complications related to the disease process, condition or treatment will be avoided or minimized Outcome: Progressing   Problem: Education: Goal: Knowledge of General Education information will improve Description: Including pain rating scale, medication(s)/side effects and non-pharmacologic comfort measures Outcome: Progressing   Problem: Health Behavior/Discharge Planning: Goal: Ability to manage health-related needs will improve Outcome: Progressing   Problem: Clinical Measurements: Goal: Ability to maintain clinical measurements within normal limits will improve Outcome: Progressing Goal: Will remain free from infection Outcome: Progressing Goal: Diagnostic test results will improve Outcome: Progressing Goal: Respiratory complications will improve Outcome: Progressing Goal: Cardiovascular complication will be avoided Outcome: Progressing   Problem: Nutrition: Goal: Adequate nutrition will be maintained Outcome: Progressing   Problem: Activity: Goal: Risk for activity intolerance will decrease Outcome: Progressing   Problem: Coping: Goal: Level of anxiety will decrease Outcome: Progressing   Problem: Elimination: Goal: Will not experience complications related to bowel motility Outcome: Progressing Goal: Will not experience complications related to urinary retention Outcome: Progressing   Problem: Safety: Goal: Ability to remain free from injury will improve Outcome: Progressing   Problem: Pain Managment: Goal: General experience of comfort will improve and/or be controlled Outcome: Progressing   Problem: Skin Integrity: Goal: Risk for impaired  skin integrity will decrease Outcome: Progressing   Problem: Education: Goal: Ability to describe self-care measures that may prevent or decrease complications (Diabetes Survival Skills Education) will improve Outcome: Progressing Goal: Individualized Educational Video(s) Outcome: Progressing   Problem: Fluid Volume: Goal: Ability to maintain a balanced intake and output will improve Outcome: Progressing   Problem: Coping: Goal: Ability to adjust to condition or change in health will improve Outcome: Progressing   Problem: Health Behavior/Discharge Planning: Goal: Ability to identify and utilize available resources and services will improve Outcome: Progressing Goal: Ability to manage health-related needs will improve Outcome: Progressing   Problem: Metabolic: Goal: Ability to maintain appropriate glucose levels will improve Outcome: Progressing   Problem: Skin Integrity: Goal: Risk for impaired skin integrity will decrease Outcome: Progressing   Problem: Nutritional: Goal: Maintenance of adequate nutrition will improve Outcome: Progressing Goal: Progress toward achieving an optimal weight will improve Outcome: Progressing   Problem: Tissue Perfusion: Goal: Adequacy of tissue perfusion will improve Outcome: Progressing

## 2023-05-09 NOTE — Progress Notes (Signed)
 [facility] Progress Note  Patient: Kyle Roberson    NWG:[id number]  DOA: 05/05/2023     Date of Service: the patient was seen and examined on 05/09/2023  Chief Complaint  Patient presents with   Fall   Brief hospital course:  Kyle Roberson is a 83 y.o. male with medical history significant of ESRD-HD (MWF), HTN, HLD, DM, CAD with stent, dCHF, depression with anxiety, BPH, anemia, A-fib on Eliquis, left eye blindness, who presents with cough, shortness of breath, diarrhea, fall.   Per pt and his wife (I called his wife by phone), patient has productive cough, sore throat, SOB, malaise for more than 5 days.  No chest pain.  No fever or chills.  Patient has diarrhea for more than 10 days, with 2- 3 times of watery diarrhea each day.  No nausea, vomiting or abdominal pain.  Patient fell this morning, no loss of consciousness. He patient states that he was going to the bathroom when he suddenly felt weak in his gave out and fell.  No unilateral numbness or tingling in extremities.  No facial droop or slurred speech.  Patient states that he has missed several dialysis. His last dialysis was on Monday.     Data reviewed independently and ED Course: pt was found to have positive COVID 19, WBC 5.6, troponin 95 --> 105, potassium 4.7, bicarbonate 18, creatinine 10.02, BUN 102.  CT head negative. Chest x-ray showed cardiomegaly with vascular congestion.  Patient is placed on telemetry bed for observation.     EKG: I have personally reviewed.  A-fib, QTc 461, low voltage, poor IV progression, anteroseptal infarction pattern.   Assessment and Plan:  # COVID-19 virus infection: No fever, no leukocytosis, no oxygen desaturation.   Chest x-ray showed vascular congestion, no infiltration. -Bronchodilators and as needed Mucinex -Supportive care 2/15 still sob after HD, may benefit from steroids and breathing treatments Started prednisone 40 mg p.o. daily for 3 days, Breo Ellipta  inhaler Continue albuterol as needed 1/16 patient is refusing inhalers, started on nebulizer treatments if he agrees.   # CAD (coronary artery disease) and Myocardial injury:  Troponin 894, but No chest pain and no significant EKG changes Continue to monitor on telemetry S/p ASA 81 mg and heparin IV infusion, discontinued on 2/14, due to GI bleed 2/14 continue to hold Eliquis for now Cardiology consulted, recommended no ischemic workup at this time, follow as an outpatient for Myoview when recovered after COVID infection TTE shows LVEF 55 to 60%, grade 2 diastolic dysfunction, no wall motion abnormality.  Severe pulmonary hypertension, RA moderately dilated.  Moderate TR As per cardio patient may need right and left heart cath due to pulmonary hypertension and elevated troponin.  # Paroxysmal atrial fibrillation: -Tele monitoring - s/p heparin IV infusion, Hb dropped, suspected GI bleed.  Patient was cleared by GI to continue heparin IV infusion.  2/15 resumed Eliquis 2.5 mg p.o. 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 nightly and Seroquel 25 mg p.o. twice daily   #  Fall at home, initial encounter: CT-head negative -Fall precaution, CK level wnl -PT/OT eval pending  # Type II diabetes mellitus with renal manifestations:  Recent A1c 7.3, poorly controlled for patient taking glipizide at home -SSI   # Essential hypertension: Patient's not taking  medications.   Nonsudden V. tach episode 7 beats run at 630 on 2/13 reported 2/13 started metoprolol 12.5 mg p.o. twice daily -IV hydralazine as needed   # Chronic diastolic CHF, severe pulmonary hypertension TTE done as above 2/15 Lasix 80 mg x 2 doses ordered by cardiology Fluid management via hemodialysis   # Dyslipidemia: on Crestor   # Diarrhea: Possibly due to COVID infection -As needed Imodium -C. 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  Interventions:  Diet: Renal diet DVT Prophylaxis: Heparin IV infusion  Advance goals of care discussion: Full code  Family Communication: family was not present at bedside, at the time of interview.  The pt provided permission to discuss medical plan with the family. Opportunity was given to ask question and all questions were answered satisfactorily.   Disposition:  Pt is from home, admitted with COVID viral infection, missed hemodialysis, elevated troponin s/p IV heparin infusion, which precludes a safe discharge. Discharge to SNF, when stable, may need few days to improve. PT and OT eval Follow TOC for placement   Subjective: No significant events overnight.  Patient was not in a good mood, he was refusing treatment.  Patient thinks that inhalers does not help and does not want nebulizer treatment.  Patient seems very depressed and upset, behavior is not good.  Patient thinks that he is a [place] pig and we are trying different medications on him.  Physical Exam: General: Mild to moderate respiratory distress affect anxious and depressed  Eyes: PERRLA ENT: Oral Mucosa Clear, moist  Neck: no JVD,  Cardiovascular: S1 and S2 Present, no Murmur,  Respiratory: Equal air entry bilaterally, mild bibasilar crackles, significant wheezes  Abdomen: Bowel Sound present, Soft and no tenderness,  Skin: no  rashes Extremities: Chronic lymphedema and chronic skin changes, dry scaly skin  Neurologic: without any new focal findings Gait not checked due to patient safety concerns  Vitals:   05/08/23 1823 05/09/23 0114 05/09/23 0500 05/09/23 0959  BP: (!) 115/53 115/88  (!) 128/56  Pulse: (!) 43 (!) 50  (!) 51  Resp: 17 18  16   Temp: 97.9 F (36.6 C) 98 F (36.7 C)  97.8 F (36.6 C)  TempSrc: Oral Oral    SpO2: 97% 94%  95%  Weight:   96.3 kg   Height:        Intake/Output Summary (Last 24 hours) at 05/09/2023 1547 Last data filed at 05/09/2023 1145 Gross per 24 hour  Intake 240 ml  Output 0 ml  Net 240 ml   Filed Weights   05/07/23 1456 05/07/23 1824 05/09/23 0500  Weight: 96.8 kg 94.8 kg 96.3 kg    Data Reviewed: I have personally reviewed and interpreted daily labs, tele strips, imagings as discussed above. I reviewed all nursing notes, pharmacy notes, vitals, pertinent old records I have discussed plan of care as described above with RN and patient/family.  CBC: Recent Labs  Lab 05/05/23 1313 05/06/23 0019 05/07/23 0749 05/07/23 1044 05/07/23 2002 05/08/23 0035 05/08/23 1638 05/09/23 0536  WBC 5.6 6.4 8.9  --   --  7.5  --  5.0  HGB 10.9* 10.6* 9.2* 9.4* 9.7* 8.8* 9.0* 8.5*  HCT 33.4* 31.5* 27.2* 29.3* 28.6* 25.5* 27.0* 25.4*  MCV 97.7 96.0 95.4  --   --  92.7  --  95.1  PLT 197 191 154  --   --  154  --  155   Basic Metabolic Panel: Recent Labs  Lab 05/05/23 1313 05/06/23 0019 05/06/23 0906 05/07/23 0749 05/08/23 0035 05/09/23 0536  NA 141 140  --  141 137 138  K 4.7 4.6  --  5.4* 3.7 4.4  CL 101 103  --  103 99 98  CO2 18* 15*  --  15* 19* 19*  GLUCOSE 203* 222*  --  100* 188* 181*  BUN 102* 115*  --  137* 78* 96*  CREATININE 10.02* 10.54*  --  11.98* 7.23* 9.05*  CALCIUM 7.3* 7.0*  --  7.0* 7.5* 7.7*  MG  --   --  1.9 1.9 1.7 1.9  PHOS  --   --  11.9* >30.0* 6.3* 8.5*    Studies: No results found.   Scheduled Meds:  acetaminophen  650 mg  Oral TID   apixaban  2.5 mg Oral BID   ascorbic acid  250 mg Oral Daily   calcium acetate  1,334 mg Oral TID WC   Chlorhexidine Gluconate Cloth  6 each Topical Daily   clotrimazole-betamethasone   Topical BID   dorzolamide-timolol  1 drop Both Eyes Daily   [START ON 05/10/2023] epoetin alfa-epbx (RETACRIT) injection  4,000 Units Intravenous Q M,W,F-HD   fluticasone furoate-vilanterol  1 puff Inhalation Daily   HYDROmorphone  2 mg Oral Once   insulin aspart  0-5 Units Subcutaneous QHS   insulin aspart  0-9 Units Subcutaneous TID WC   multivitamin  1 tablet Oral QHS   neomycin-polymyxin b-dexamethasone  1 Application Right Eye TID   pantoprazole (PROTONIX) IV  40 mg Intravenous Q12H   predniSONE  40 mg Oral Q breakfast   rosuvastatin  10 mg Oral Once per day on Monday Wednesday Friday   Continuous Infusions:   PRN Meds: [START ON 05/11/2023] acetaminophen, albuterol, dextromethorphan-guaiFENesin, hydrALAZINE, HYDROmorphone **OR** HYDROmorphone (DILAUDID) injection, loperamide, nitroGLYCERIN, ondansetron (ZOFRAN) IV  Time spent: 55 minutes  Author: Gillis Santa. MD [facility] 05/09/2023 3:47 PM  To reach On-call, see care teams to locate the attending and reach out to them via [email or web address]. If 7PM-7AM, please contact night-coverage If you still have difficulty reaching the attending provider, please page the [facility] (Director on Call) for [facility] on amion for assistance.

## 2023-05-09 NOTE — Progress Notes (Signed)
 Daily Progress Note   Patient Name: Kyle Roberson       Date: 05/09/2023 DOB: 01-Feb-1941  Age: 83 y.o. MRN#: [id number] Attending Physician: Kyle Santa, MD Primary Care Physician: Kyle Milan, MD Admit Date: 05/05/2023  Reason for Consultation/Follow-up: Establishing goals of care  HPI/Brief Hospital Review:  83 y.o. male  with past medical history of ESRD on HD MWF, HTN, HLD, T2DM, CAD s/p PCI, CHF, anemia, A. Fib on Eliquis, anxiety and depression admitted from home on 05/05/2023 with fall, diarrhea, cough and shortness of breath.    Found to be COVID (+) admitted for supportive care, elevated troponin likely due supply-demand, considering outpatient stress test versus cardiac catheterization, significant pulmonary HTN   Noted prolonged admit 9/5-10/14 in which he was closely followed by PMT at that time, discharged to [facility] and returned home 10/28   Palliative medicine was consulted for assisting with goals of care conversations.  Subjective: Extensive chart review has been completed prior to meeting patient including labs, vital signs, imaging, progress notes, orders, and available advanced directive documents from current and previous encounters.    Visited with Kyle Roberson at his bedside. He is awake, alert, oriented and able to engage in conversation. He expresses his annoyance for remaining in hospital. He denies acute pain or discomfort, feels his breathing is at his bedside. He is frustrated that he continues to be offered inhalers, not interested in inhalers and also refuses to utilize nebulizer treatments. He shares he does not use inhalers at home and does not feel they are beneficial to him.  Discussed the importance of engaging and interacting with therapy if his goal  is to return home. We discussed risk of decreased function and increased weakness without proper exercise and movement. Discussed need to remain active if he wishes to return home and to be able to transport back and forth to dialysis. Kyle Roberson shares that his home is handicap accessible and will be able to move about with assistance with rollator and may need to use wheelchair.  Goals remain set. PMT will step away from daily visits, please engage if needs or concerns arise.  Care plan was discussed with nursing staff and primary team  Thank you for allowing the Palliative Medicine Team to assist in the care of this patient.  Total time:  35 minutes  Time spent includes: Detailed review of medical records (labs, imaging, vital signs), medically appropriate exam (mental status, respiratory, cardiac, skin), discussed with treatment team, counseling and educating patient, family and staff, documenting clinical information, medication management and coordination of care.  Kyle Deed, DNP, AGNP-C Palliative Medicine   Please contact Palliative Medicine Team phone at [phone] for questions and concerns.

## 2023-05-09 NOTE — Progress Notes (Signed)
 [facility]  ROUNDING NOTE   Subjective:   Kyle Roberson is a 83 y.o. male with diabetes mellitus type II, hypertension, coronary artery disease, chronic diastolic congestive heart failure, atrial fibrillation, pulmonary hypertension, and historyof subdural hematoma who is admitted for Weakness [R53.1] Fall, initial encounter [W19.XXXA] COVID-19 virus infection [U07.1] COVID-19 [U07.1] Uremia of renal origin [N19]  Patient is known to our practice and receives outpatient dialysis at [facility] on a MWF, supervised by Dr Cherylann Ratel. He received his last treatment on Wednesday.   Patient seen resting in bed Alert and oriented No family present Reports poor oral intake Room air  Objective:  Vital signs in last 24 hours:  Temp:  [97.8 F (36.6 C)-98 F (36.7 C)] 97.8 F (36.6 C) (02/16 0959) Pulse Rate:  [43-51] 51 (02/16 0959) Resp:  [16-18] 16 (02/16 0959) BP: (115-128)/(53-88) 128/56 (02/16 0959) SpO2:  [94 %-97 %] 95 % (02/16 0959) Weight:  [96.3 kg] 96.3 kg (02/16 0500)  Weight change: -0.5 kg Filed Weights   05/07/23 1456 05/07/23 1824 05/09/23 0500  Weight: 96.8 kg 94.8 kg 96.3 kg    Intake/Output: I/O last 3 completed shifts: In: 683.5 [P.O.:530; I.V.:153.5] Out: -    Intake/Output this shift:  Total I/O In: 240 [P.O.:240] Out: 27 [Urine:27]  Physical Exam: General: NAD  Head: Normocephalic, atraumatic. Moist oral mucosal membranes  Eyes: Anicteric  Lungs:  Clear to auscultation, room air  Heart: Regular rate and rhythm  Abdomen:  Soft, nontender  Extremities: 1+ peripheral edema.  Neurologic: Alert and oriented, moving all four extremities  Skin: Scaly bilateral lower extremities  Access: Right chest tunneled access    Basic Metabolic Panel: Recent Labs  Lab 05/05/23 1313 05/06/23 0019 05/06/23 0906 05/07/23 0749 05/08/23 0035 05/09/23 0536  NA 141 140  --  141 137 138  K 4.7 4.6  --  5.4* 3.7 4.4  CL 101 103  --  103 99 98   CO2 18* 15*  --  15* 19* 19*  GLUCOSE 203* 222*  --  100* 188* 181*  BUN 102* 115*  --  137* 78* 96*  CREATININE 10.02* 10.54*  --  11.98* 7.23* 9.05*  CALCIUM 7.3* 7.0*  --  7.0* 7.5* 7.7*  MG  --   --  1.9 1.9 1.7 1.9  PHOS  --   --  11.9* >30.0* 6.3* 8.5*    Liver Function Tests: Recent Labs  Lab 05/05/23 1313  AST 24  ALT 26  ALKPHOS 51  BILITOT 0.8  PROT 6.4*  ALBUMIN 3.2*   Recent Labs  Lab 05/05/23 1313  LIPASE 60*   No results for input(s): "AMMONIA" in the last 168 hours.  CBC: Recent Labs  Lab 05/05/23 1313 05/06/23 0019 05/07/23 0749 05/07/23 1044 05/07/23 2002 05/08/23 0035 05/08/23 1638 05/09/23 0536  WBC 5.6 6.4 8.9  --   --  7.5  --  5.0  HGB 10.9* 10.6* 9.2* 9.4* 9.7* 8.8* 9.0* 8.5*  HCT 33.4* 31.5* 27.2* 29.3* 28.6* 25.5* 27.0* 25.4*  MCV 97.7 96.0 95.4  --   --  92.7  --  95.1  PLT 197 191 154  --   --  154  --  155    Cardiac Enzymes: Recent Labs  Lab 05/08/23 0927 05/09/23 0536  CKTOTAL 94 75    BNP: Invalid input(s): "POCBNP"  CBG: Recent Labs  Lab 05/08/23 0838 05/08/23 1152 05/08/23 1708 05/08/23 2126 05/09/23 0813  GLUCAP 127* 183* 190* 246* 146*  Microbiology: Results for orders placed or performed during the hospital encounter of 05/05/23  Resp panel by RT-PCR (RSV, Flu A&B, Covid) Anterior Nasal Swab     Status: Abnormal   Collection Time: 05/05/23  4:33 PM   Specimen: Anterior Nasal Swab  Result Value Ref Range Status   SARS Coronavirus 2 by RT PCR POSITIVE (A) NEGATIVE Final    Comment: (NOTE) SARS-CoV-2 target nucleic acids are DETECTED.  The SARS-CoV-2 RNA is generally detectable in upper respiratory specimens during the acute phase of infection. Positive results are indicative of the presence of the identified virus, but do not rule out bacterial infection or co-infection with other pathogens not detected by the test. Clinical correlation with patient history and other diagnostic information is  necessary to determine patient infection status. The expected result is Negative.  Fact Sheet for Patients: BloggerCourse.com  Fact Sheet for Healthcare Providers: SeriousBroker.it  This test is not yet approved or cleared by the [place] FDA and  has been authorized for detection and/or diagnosis of SARS-CoV-2 by FDA under an Emergency Use Authorization (EUA).  This EUA will remain in effect (meaning this test can be used) for the duration of  the COVID-19 declaration under Section 564(b)(1) of the A ct, 21 U.S.C. section 360bbb-3(b)(1), unless the authorization is terminated or revoked sooner.     Influenza A by PCR NEGATIVE NEGATIVE Final   Influenza B by PCR NEGATIVE NEGATIVE Final    Comment: (NOTE) The Xpert Xpress SARS-CoV-2/FLU/RSV plus assay is intended as an aid in the diagnosis of influenza from Nasopharyngeal swab specimens and should not be used as a sole basis for treatment. Nasal washings and aspirates are unacceptable for Xpert Xpress SARS-CoV-2/FLU/RSV testing.  Fact Sheet for Patients: BloggerCourse.com  Fact Sheet for Healthcare Providers: SeriousBroker.it  This test is not yet approved or cleared by the [place] FDA and has been authorized for detection and/or diagnosis of SARS-CoV-2 by FDA under an Emergency Use Authorization (EUA). This EUA will remain in effect (meaning this test can be used) for the duration of the COVID-19 declaration under Section 564(b)(1) of the Act, 21 U.S.C. section 360bbb-3(b)(1), unless the authorization is terminated or revoked.     Resp Syncytial Virus by PCR NEGATIVE NEGATIVE Final    Comment: (NOTE) Fact Sheet for Patients: BloggerCourse.com  Fact Sheet for Healthcare Providers: SeriousBroker.it  This test is not yet approved or cleared by the [place] FDA  and has been authorized for detection and/or diagnosis of SARS-CoV-2 by FDA under an Emergency Use Authorization (EUA). This EUA will remain in effect (meaning this test can be used) for the duration of the COVID-19 declaration under Section 564(b)(1) of the Act, 21 U.S.C. section 360bbb-3(b)(1), unless the authorization is terminated or revoked.  Performed at [facility], [address] [place], [place] [address]   C Difficile Quick Screen w PCR reflex     Status: None   Collection Time: 05/05/23  7:35 PM   Specimen: STOOL  Result Value Ref Range Status   C Diff antigen NEGATIVE NEGATIVE Final   C Diff toxin NEGATIVE NEGATIVE Final   C Diff interpretation No C. difficile detected.  Final    Comment: Performed at [facility], [address] [place], [place] [address]    Coagulation Studies: Recent Labs    05/06/23 1434  LABPROT 16.7*  INR 1.3*    Urinalysis: No results for input(s): "COLORURINE", "LABSPEC", "PHURINE", "GLUCOSEU", "HGBUR", "BILIRUBINUR", "KETONESUR", "PROTEINUR", "UROBILINOGEN", "NITRITE", "LEUKOCYTESUR" in  the last 72 hours.  Invalid input(s): "APPERANCEUR"    Imaging: ECHOCARDIOGRAM COMPLETE Result Date: 05/07/2023    ECHOCARDIOGRAM REPORT   Patient Name:   Kyle Roberson Date of Exam: 05/07/2023 Medical Rec #:  [id number]         Height:       69.0 in Accession #:    [id number]        Weight:       216.9 lb Date of Birth:  12-04-1940        BSA:          2.139 m Patient Age:    82 years          BP:           10/48 mmHg Patient Gender: M                 HR:           44 bpm. Exam Location:  [facility] Procedure: 2D Echo, Cardiac Doppler and Color Doppler (Both Spectral and Color            Flow Doppler were utilized during procedure). Indications:     NSTEMI I21.4  History:         Patient has prior history of Echocardiogram examinations. CAD;                  Risk Factors:Hypertension.  Sonographer:     Neysa Bonito Roar Referring Phys:  [id number]  Antonieta Iba Diagnosing Phys: Debbe Odea MD IMPRESSIONS  1. Left ventricular ejection fraction, by estimation, is 55 to 60%. The left ventricle has normal function. The left ventricle has no regional wall motion abnormalities. There is mild left ventricular hypertrophy. Left ventricular diastolic parameters are consistent with Grade II diastolic dysfunction (pseudonormalization).  2. Right ventricular systolic function is normal. The right ventricular size is normal. There is severely elevated pulmonary artery systolic pressure.  3. Left atrial size was mildly dilated.  4. Right atrial size was moderately dilated.  5. The mitral valve is normal in structure. Mild mitral valve regurgitation.  6. Tricuspid valve regurgitation is moderate to severe.  7. The aortic valve is tricuspid. Aortic valve regurgitation is mild. Aortic valve sclerosis/calcification is present, without any evidence of aortic stenosis. Aortic valve mean gradient measures 6.0 mmHg.  8. The inferior vena cava is dilated in size with <50% respiratory variability, suggesting right atrial pressure of 15 mmHg. FINDINGS  Left Ventricle: Left ventricular ejection fraction, by estimation, is 55 to 60%. The left ventricle has normal function. The left ventricle has no regional wall motion abnormalities. Strain imaging was not performed. The left ventricular internal cavity  size was normal in size. There is mild left ventricular hypertrophy. Left ventricular diastolic parameters are consistent with Grade II diastolic dysfunction (pseudonormalization). Right Ventricle: The right ventricular size is normal. No increase in right ventricular wall thickness. Right ventricular systolic function is normal. There is severely elevated pulmonary artery systolic pressure. The tricuspid regurgitant velocity is 3.95 m/s, and with an assumed right atrial pressure of 15 mmHg, the estimated right ventricular systolic pressure is 77.4 mmHg. Left Atrium: Left  atrial size was mildly dilated. Right Atrium: Right atrial size was moderately dilated. Pericardium: There is no evidence of pericardial effusion. Mitral Valve: The mitral valve is normal in structure. Mild mitral valve regurgitation. MV peak gradient, 6.9 mmHg. The mean mitral valve gradient is 2.0 mmHg. Tricuspid Valve: The tricuspid valve is normal in structure. Tricuspid valve regurgitation is  moderate to severe. Aortic Valve: The aortic valve is tricuspid. Aortic valve regurgitation is mild. Aortic regurgitation PHT measures 454 msec. Aortic valve sclerosis/calcification is present, without any evidence of aortic stenosis. Aortic valve mean gradient measures 6.0  mmHg. Aortic valve peak gradient measures 13.9 mmHg. Aortic valve area, by VTI measures 1.23 cm. Pulmonic Valve: The pulmonic valve was normal in structure. Pulmonic valve regurgitation is trivial. Aorta: The aortic root and ascending aorta are structurally normal, with no evidence of dilitation. Venous: The inferior vena cava is dilated in size with less than 50% respiratory variability, suggesting right atrial pressure of 15 mmHg. IAS/Shunts: No atrial level shunt detected by color flow Doppler. Additional Comments: 3D imaging was not performed.  LEFT VENTRICLE PLAX 2D LVIDd:         5.10 cm   Diastology LVIDs:         3.50 cm   LV e' medial:    5.12 cm/s LV PW:         1.20 cm   LV E/e' medial:  22.5 LV IVS:        1.30 cm   LV e' lateral:   8.93 cm/s LVOT diam:     1.90 cm   LV E/e' lateral: 12.9 LV SV:         44 LV SV Index:   20 LVOT Area:     2.84 cm  RIGHT VENTRICLE RV Basal diam:  3.90 cm RV Mid diam:    3.50 cm RV S prime:     8.85 cm/s TAPSE (M-mode): 1.5 cm LEFT ATRIUM             Index        RIGHT ATRIUM           Index LA diam:        4.60 cm 2.15 cm/m   RA Area:     27.20 cm LA Vol (A2C):   64.6 ml 30.21 ml/m  RA Volume:   98.40 ml  46.01 ml/m LA Vol (A4C):   86.1 ml 40.26 ml/m LA Biplane Vol: 74.9 ml 35.02 ml/m  AORTIC VALVE                      PULMONIC VALVE AV Area (Vmax):    1.15 cm      PV Vmax:          1.09 m/s AV Area (Vmean):   1.14 cm      PV Peak grad:     4.8 mmHg AV Area (VTI):     1.23 cm      PR End Diast Vel: 7.84 msec AV Vmax:           186.50 cm/s   RVOT Peak grad:   1 mmHg AV Vmean:          112.500 cm/s AV VTI:            0.356 m AV Peak Grad:      13.9 mmHg AV Mean Grad:      6.0 mmHg LVOT Vmax:         75.80 cm/s LVOT Vmean:        45.300 cm/s LVOT VTI:          0.154 m LVOT/AV VTI ratio: 0.43 AI PHT:            454 msec  AORTA Ao Root diam: 2.60 cm Ao Asc diam:  3.70 cm MITRAL VALVE  TRICUSPID VALVE MV Area (PHT): 3.74 cm     TR Peak grad:   62.4 mmHg MV Area VTI:   1.00 cm     TR Vmax:        395.00 cm/s MV Peak grad:  6.9 mmHg MV Mean grad:  2.0 mmHg     SHUNTS MV Vmax:       1.31 m/s     Systemic VTI:  0.15 m MV Vmean:      67.8 cm/s    Systemic Diam: 1.90 cm MV Decel Time: 203 msec MV E velocity: 115.00 cm/s Debbe Odea MD Electronically signed by Debbe Odea MD Signature Date/Time: 05/07/2023/5:18:07 PM    Final      Medications:      acetaminophen  650 mg Oral TID   apixaban  2.5 mg Oral BID   ascorbic acid  250 mg Oral Daily   calcium acetate  1,334 mg Oral TID WC   Chlorhexidine Gluconate Cloth  6 each Topical Daily   clotrimazole-betamethasone   Topical BID   dorzolamide-timolol  1 drop Both Eyes Daily   fluticasone furoate-vilanterol  1 puff Inhalation Daily   HYDROmorphone  2 mg Oral Once   insulin aspart  0-5 Units Subcutaneous QHS   insulin aspart  0-9 Units Subcutaneous TID WC   multivitamin  1 tablet Oral QHS   neomycin-polymyxin b-dexamethasone  1 Application Right Eye TID   pantoprazole (PROTONIX) IV  40 mg Intravenous Q12H   predniSONE  40 mg Oral Q breakfast   rosuvastatin  10 mg Oral Once per day on Monday Wednesday Friday   [START ON 05/11/2023] acetaminophen, albuterol, dextromethorphan-guaiFENesin, heparin sodium (porcine), hydrALAZINE,  HYDROmorphone **OR** HYDROmorphone (DILAUDID) injection, loperamide, nitroGLYCERIN, ondansetron (ZOFRAN) IV  Assessment/ Plan:  Mr. Marlo Goodrich is a 83 y.o.  male with diabetes mellitus type II, hypertension, coronary artery disease, chronic diastolic congestive heart failure, atrial fibrillation, pulmonary hypertension, and historyof subdural hematoma who is admitted for Weakness [R53.1] Fall, initial encounter [W19.XXXA] COVID-19 virus infection [U07.1] COVID-19 [U07.1] Uremia of renal origin [N19]  [facility]/MWF/right chest PermCath  End-stage renal disease on hemodialysis.  Next treatment scheduled for Monday.  COVID-19, found positive this admission.  Continue supportive care  3. Anemia of chronic kidney disease  Lab Results  Component Value Date   HGB 8.5 (L) 05/09/2023    Hemoglobin 8.5.  Will order EPO with dialysis.  Patient receives Mircera at outpatient clinic.   4. Secondary Hyperparathyroidism: with outpatient labs: None available  Lab Results  Component Value Date   PTH 344 (H) 01/07/2023   CALCIUM 7.7 (L) 05/09/2023   CAION 1.14 (L) 12/02/2022   PHOS 8.5 (H) 05/09/2023    Will continue to monitor bone minerals during this admission.  Serum calcium remains decreased but slowly improving since admission.  Hyperphosphatemia.  Phosphorus has improved.  Continue prescribed binders for now.   LOS: 2 Kamarri Fischetti 2/16/202512:32 PM

## 2023-05-10 DIAGNOSIS — U071 COVID-19: Secondary | ICD-10-CM | POA: Diagnosis not present

## 2023-05-10 LAB — CBC
HCT: 25 % — ABNORMAL LOW (ref 39.0–52.0)
Hemoglobin: 8.4 g/dL — ABNORMAL LOW (ref 13.0–17.0)
MCH: 31.7 pg (ref 26.0–34.0)
MCHC: 33.6 g/dL (ref 30.0–36.0)
MCV: 94.3 fL (ref 80.0–100.0)
Platelets: 169 10*3/uL (ref 150–400)
RBC: 2.65 MIL/uL — ABNORMAL LOW (ref 4.22–5.81)
RDW: 13.5 % (ref 11.5–15.5)
WBC: 7.6 10*3/uL (ref 4.0–10.5)
nRBC: 0 % (ref 0.0–0.2)

## 2023-05-10 LAB — BASIC METABOLIC PANEL
Anion gap: 18 — ABNORMAL HIGH (ref 5–15)
BUN: 119 mg/dL — ABNORMAL HIGH (ref 8–23)
CO2: 21 mmol/L — ABNORMAL LOW (ref 22–32)
Calcium: 7.6 mg/dL — ABNORMAL LOW (ref 8.9–10.3)
Chloride: 99 mmol/L (ref 98–111)
Creatinine, Ser: 10.78 mg/dL — ABNORMAL HIGH (ref 0.61–1.24)
GFR, Estimated: 4 mL/min — ABNORMAL LOW (ref >60)
Glucose, Bld: 179 mg/dL — ABNORMAL HIGH (ref 70–99)
Potassium: 4.4 mmol/L (ref 3.5–5.1)
Sodium: 138 mmol/L (ref 135–145)

## 2023-05-10 LAB — MAGNESIUM: Magnesium: 2 mg/dL (ref 1.7–2.4)

## 2023-05-10 LAB — GLUCOSE, CAPILLARY
Glucose-Capillary: 138 mg/dL — ABNORMAL HIGH (ref 70–99)
Glucose-Capillary: 178 mg/dL — ABNORMAL HIGH (ref 70–99)
Glucose-Capillary: 206 mg/dL — ABNORMAL HIGH (ref 70–99)
Glucose-Capillary: 322 mg/dL — ABNORMAL HIGH (ref 70–99)

## 2023-05-10 LAB — PHOSPHORUS: Phosphorus: 8.5 mg/dL — ABNORMAL HIGH (ref 2.5–4.6)

## 2023-05-10 MED ORDER — NEPRO/CARBSTEADY PO LIQD
237.0000 mL | Freq: Two times a day (BID) | ORAL | Status: DC
  Administered 2023-05-13 – 2023-05-20 (×5): 237 mL via ORAL

## 2023-05-10 MED ORDER — IPRATROPIUM-ALBUTEROL 0.5-2.5 (3) MG/3ML IN SOLN
3.0000 mL | Freq: Two times a day (BID) | RESPIRATORY_TRACT | Status: DC
  Administered 2023-05-11 (×2): 3 mL via RESPIRATORY_TRACT
  Filled 2023-05-10 (×2): qty 3

## 2023-05-10 MED ORDER — DEXAMETHASONE 6 MG PO TABS
6.0000 mg | ORAL_TABLET | Freq: Every day | ORAL | Status: DC
  Administered 2023-05-11 – 2023-05-14 (×4): 6 mg via ORAL
  Filled 2023-05-10 (×4): qty 1

## 2023-05-10 NOTE — Progress Notes (Signed)
 [facility] Progress Note  Patient: Kyle Roberson    IOE:[id number]  DOA: 05/05/2023     Date of Service: the patient was seen and examined on 05/10/2023  Chief Complaint  Patient presents with   Fall   Brief hospital course:  Kyle Roberson is a 83 y.o. male with medical history significant of ESRD-HD (MWF), HTN, HLD, DM, CAD with stent, dCHF, depression with anxiety, BPH, anemia, A-fib on Eliquis, left eye blindness, who presents with cough, shortness of breath, diarrhea, fall.   Per pt and his wife (I called his wife by phone), patient has productive cough, sore throat, SOB, malaise for more than 5 days.  No chest pain.  No fever or chills.  Patient has diarrhea for more than 10 days, with 2- 3 times of watery diarrhea each day.  No nausea, vomiting or abdominal pain.  Patient fell this morning, no loss of consciousness. He patient states that he was going to the bathroom when he suddenly felt weak in his gave out and fell.  No unilateral numbness or tingling in extremities.  No facial droop or slurred speech.  Patient states that he has missed several dialysis. His last dialysis was on Monday.     Data reviewed independently and ED Course: pt was found to have positive COVID 19, WBC 5.6, troponin 95 --> 105, potassium 4.7, bicarbonate 18, creatinine 10.02, BUN 102.  CT head negative. Chest x-ray showed cardiomegaly with vascular congestion.  Patient is placed on telemetry bed for observation.     EKG: I have personally reviewed.  A-fib, QTc 461, low voltage, poor IV progression, anteroseptal infarction pattern.   Assessment and Plan:  # COVID-19 virus infection: No fever, no leukocytosis, no oxygen desaturation.   Chest x-ray showed vascular congestion, no infiltration. -Bronchodilators and as needed Mucinex -Supportive care 2/15 still sob after HD, may benefit from steroids and breathing treatments S/p Prednisone 40 mg p.o. daily x 3 days, Breo Ellipta inhaler Continue  albuterol as needed 2/16 patient is refusing inhalers, started on nebulizer treatments if he agrees. 2/17 started Decadron 6 mg p.o. daily for 7 days  # CAD (coronary artery disease) and Myocardial injury:  Troponin 894, but No chest pain and no significant EKG changes Continue to monitor on telemetry S/p ASA 81 mg and heparin IV infusion, discontinued on 2/14, due to GI bleed 2/14 continue to hold Eliquis for now Cardiology consulted, recommended no ischemic workup at this time, follow as an outpatient for Myoview when recovered after COVID infection TTE shows LVEF 55 to 60%, grade 2 diastolic dysfunction, no wall motion abnormality.  Severe pulmonary hypertension, RA moderately dilated.  Moderate TR As per cardio patient may need right and left heart cath due to pulmonary hypertension and elevated troponin.  # Paroxysmal atrial fibrillation: -Tele monitoring - s/p heparin IV infusion, Hb dropped, suspected GI bleed.  Patient was cleared by GI to continue heparin IV infusion.  2/15 resumed Eliquis 2.5 mg p.o. 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 nightly and Seroquel 25 mg p.o. twice daily   # Fall at home, initial encounter: CT-head negative -Fall precaution, CK level wnl -PT/OT eval pending  # Type II diabetes mellitus with renal manifestations:  Recent A1c 7.3, poorly controlled for patient taking glipizide at home -SSI   # Essential  hypertension: Patient's not taking medications.   Nonsudden V. tach episode 7 beats run at 630 on 2/13 reported 2/13 started metoprolol 12.5 mg p.o. twice daily -IV hydralazine as needed   # Chronic diastolic CHF, severe pulmonary hypertension TTE done as above 2/15 Lasix 80 mg x 2 doses ordered by cardiology Fluid management via hemodialysis   # Dyslipidemia: on Crestor   # Diarrhea: Possibly due to COVID infection -As needed Imodium -C. 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  Interventions:  Diet: Renal diet DVT Prophylaxis: Heparin IV infusion  Advance goals of care discussion: Full code  Family Communication: family was not present at bedside, at the time of interview.  The pt provided permission to discuss medical plan with the family. Opportunity was given to ask question and all questions were answered satisfactorily.   Disposition:  Pt is from home, admitted with COVID viral infection, missed hemodialysis, elevated troponin s/p IV heparin infusion, which precludes a safe discharge. Discharge to SNF, when stable, may need few days to improve. PT and OT eval Follow TOC for placement   Subjective: No significant events overnight.  Patient was not in a good mood, he was refusing treatment.  Patient thinks that inhalers does not help and does not want nebulizer treatment.  Patient seems very depressed and upset, behavior is not good.  Patient thinks that he is a [place] pig and we are trying different medications on him.  Physical Exam: General: Mild respiratory distress, improved as compared to yesterday affect anxious and depressed  Eyes: PERRLA ENT: Oral Mucosa Clear, moist  Neck: no JVD,  Cardiovascular: S1 and S2 Present, no Murmur,  Respiratory: Equal air entry bilaterally, mild bibasilar crackles, mild wheezes  Abdomen: Bowel  Sound present, Soft and no tenderness,  Skin: no rashes Extremities: Chronic lymphedema and chronic skin changes, dry scaly skin, improving Neurologic: without any new focal findings Gait not checked due to patient safety concerns  Vitals:   05/09/23 1619 05/09/23 2319 05/10/23 0500 05/10/23 0829  BP: (!) 116/92 110/82  133/78  Pulse: (!) 45 (!) 44  (!) 58  Resp: 16 20  18   Temp: (!) 97.5 F (36.4 C) 97.7 F (36.5 C)  (!) 97.4 F (36.3 C)  TempSrc: Oral     SpO2: 94% 99%  92%  Weight:   97.3 kg   Height:        Intake/Output Summary (Last 24 hours) at 05/10/2023 1343 Last data filed at 05/09/2023 2120 Gross per 24 hour  Intake 240 ml  Output --  Net 240 ml   Filed Weights   05/07/23 1824 05/09/23 0500 05/10/23 0500  Weight: 94.8 kg 96.3 kg 97.3 kg    Data Reviewed: I have personally reviewed and interpreted daily labs, tele strips, imagings as discussed above. I reviewed all nursing notes, pharmacy notes, vitals, pertinent old records I have discussed plan of care as described above with RN and patient/family.  CBC: Recent Labs  Lab 05/06/23 0019 05/07/23 0749 05/07/23 1044 05/07/23 2002 05/08/23 0035 05/08/23 1638 05/09/23 0536 05/10/23  1220  WBC 6.4 8.9  --   --  7.5  --  5.0 7.6  HGB 10.6* 9.2*   < > 9.7* 8.8* 9.0* 8.5* 8.4*  HCT 31.5* 27.2*   < > 28.6* 25.5* 27.0* 25.4* 25.0*  MCV 96.0 95.4  --   --  92.7  --  95.1 94.3  PLT 191 154  --   --  154  --  155 169   < > = values in this interval not displayed.   Basic Metabolic Panel: Recent Labs  Lab 05/05/23 1313 05/06/23 0019 05/06/23 0906 05/07/23 0749 05/08/23 0035 05/09/23 0536  NA 141 140  --  141 137 138  K 4.7 4.6  --  5.4* 3.7 4.4  CL 101 103  --  103 99 98  CO2 18* 15*  --  15* 19* 19*  GLUCOSE 203* 222*  --  100* 188* 181*  BUN 102* 115*  --  137* 78* 96*  CREATININE 10.02* 10.54*  --  11.98* 7.23* 9.05*  CALCIUM 7.3* 7.0*  --  7.0* 7.5* 7.7*  MG  --   --  1.9 1.9 1.7 1.9  PHOS  --    --  11.9* >30.0* 6.3* 8.5*    Studies: No results found.   Scheduled Meds:  acetaminophen  650 mg Oral TID   apixaban  2.5 mg Oral BID   arformoterol  15 mcg Nebulization BID   ascorbic acid  250 mg Oral Daily   budesonide (PULMICORT) nebulizer solution  0.25 mg Nebulization BID   calcium acetate  1,334 mg Oral TID WC   Chlorhexidine Gluconate Cloth  6 each Topical Daily   clotrimazole-betamethasone   Topical BID   dorzolamide-timolol  1 drop Both Eyes Daily   epoetin alfa-epbx (RETACRIT) injection  4,000 Units Intravenous Q M,W,F-HD   feeding supplement (NEPRO CARB STEADY)  237 mL Oral BID BM   fluticasone furoate-vilanterol  1 puff Inhalation Daily   HYDROmorphone  2 mg Oral Once   insulin aspart  0-5 Units Subcutaneous QHS   insulin aspart  0-9 Units Subcutaneous TID WC   ipratropium-albuterol  3 mL Nebulization Q6H   multivitamin  1 tablet Oral QHS   neomycin-polymyxin b-dexamethasone  1 Application Right Eye TID   pantoprazole (PROTONIX) IV  40 mg Intravenous Q12H   QUEtiapine  25 mg Oral BID   rosuvastatin  10 mg Oral Once per day on Monday Wednesday Friday   traZODone  50 mg Oral QHS   Continuous Infusions:   PRN Meds: [START ON 05/11/2023] acetaminophen, albuterol, dextromethorphan-guaiFENesin, hydrALAZINE, HYDROmorphone **OR** HYDROmorphone (DILAUDID) injection, loperamide, nitroGLYCERIN, ondansetron (ZOFRAN) IV  Time spent: 55 minutes  Author: Gillis Santa. MD [facility] 05/10/2023 1:43 PM  To reach On-call, see care teams to locate the attending and reach out to them via [email or web address]. If 7PM-7AM, please contact night-coverage If you still have difficulty reaching the attending provider, please page the [facility] (Director on Call) for [facility] on amion for assistance.

## 2023-05-10 NOTE — Progress Notes (Signed)
 Tele called reported patient's heart rate had dipped into the 30s. Went into room and awakened patient. Patient stated that his heart rate is low baseline and drops even more when he sleeps. Chaged tele battery. Heart rate is 44 which is his the rate he has been at while sleeping on previous records. Will continue to monitor.

## 2023-05-10 NOTE — TOC Initial Note (Signed)
 Transition of Care [facility]) - Initial/Assessment Note    Patient Details  Name: Kyle Roberson MRN: [id number] Date of Birth: 01/08/1941  [facility]) CM/SW Contact:    Marlowe Sax, RN Phone Number: 05/10/2023, 3:25 PM  Clinical Narrative:                 Met with the patient at the bedside He stated that his wife lives at home with him and provides transportation He has a Medical laboratory scientific officer - quad;Shower seat;Grab bars - toilet;Grab bars - tub/shower;Hand held shower head;Wheelchair Financial trader (4 wheels) Additional Comments: reports he was using rollator more lately   He is refusing going to STR and refuses HH stating that he is fine and does not need it, he reports his wife will be the one to pick him up at DC  Expected Discharge Plan: Home/Self Care Barriers to Discharge: Continued Medical Work up   Patient Goals and CMS Choice Patient states their goals for this hospitalization and ongoing recovery are:: go home          Expected Discharge Plan and Services   Discharge Planning Services: CM Consult   Living arrangements for the past 2 months: Single Family Home                 DME Arranged: N/A DME Agency: NA       HH Arranged: Refused SNF, Patient Refused HH HH Agency: NA        Prior Living Arrangements/Services Living arrangements for the past 2 months: Single Family Home Lives with:: Spouse Patient language and need for interpreter reviewed:: Yes Do you feel safe going back to the place where you live?: Yes      Need for Family Participation in Patient Care: Yes (Comment) Care giver support system in place?: Yes (comment) Current home services: DME (Cane - quad;Shower seat;Grab bars - toilet;Grab bars - tub/shower;Hand held shower head;Wheelchair Financial trader (4 wheels)  Additional Comments: reports he was using rollator more lately) Criminal Activity/Legal Involvement Pertinent to Current Situation/Hospitalization: No - Comment as  needed  Activities of Daily Living      Permission Sought/Granted   Permission granted to share information with : Yes, Verbal Permission Granted              Emotional Assessment Appearance:: Appears stated age Attitude/Demeanor/Rapport: Engaged Affect (typically observed): Accepting, Frustrated Orientation: : Oriented to Self, Oriented to Place, Oriented to  Time, Oriented to Situation Alcohol / Substance Use: Not Applicable Psych Involvement: No (comment)  Admission diagnosis:  Weakness [R53.1] Fall, initial encounter [W19.XXXA] COVID-19 virus infection [U07.1] COVID-19 [U07.1] Uremia of renal origin [N19] Patient Active Problem List   Diagnosis Date Noted   Uremia of renal origin 05/07/2023   Demand ischemia (HCC) 05/06/2023   COVID-19 05/05/2023   Myocardial injury 05/05/2023   Type II diabetes mellitus with renal manifestations (HCC) 05/05/2023   Diarrhea 05/05/2023   Fall 05/05/2023   Obesity (BMI 30-39.9) 05/05/2023   Weakness 01/04/2023   ESRD on dialysis (HCC) 12/09/2022   Cardiogenic shock (HCC) 12/04/2022   Cardiorenal syndrome 12/04/2022   Pressure injury of skin 12/04/2022   Acute on chronic diastolic CHF (congestive heart failure) (HCC) 11/26/2022   Hypervolemia 11/26/2022   Pulmonary hypertension, unspecified (HCC) 02/19/2022   Encounter regarding vascular access for dialysis for end-stage renal disease (HCC) 02/19/2022   Permanent atrial fibrillation (HCC) 02/11/2022   Paroxysmal atrial fibrillation (HCC) 02/09/2022   Dyslipidemia 02/09/2022   Depression 02/09/2022  Elevated troponin 02/09/2022   Thyroid nodule 06/04/2021   Diabetic peripheral neuropathy (HCC)    History of subdural hemorrhage 02/04/2021   Hyperlipidemia associated with type 2 diabetes mellitus (HCC)    Acute on chronic right-sided heart failure (HCC) 05/07/2019   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 04/28/2019   CKD (chronic kidney disease) stage 4,  GFR 15-29 ml/min (HCC) 04/27/2019   Acute on chronic heart failure with preserved ejection fraction (HFpEF) (HCC) 04/27/2019   Obesity, Class III, BMI 40-49.9 (morbid obesity) (HCC) 04/27/2019   Lymphedema 03/23/2019   Erectile dysfunction 08/01/2018   History of colonic diverticulitis 08/01/2018   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   Venous insufficiency 08/01/2018   Persistent atrial fibrillation (HCC) 08/01/2018   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   Essential hypertension 07/06/2018   CAD (coronary artery disease) 07/06/2018   Type 2 diabetes mellitus with stage 4 chronic kidney disease, without long-term current use of insulin (HCC) 07/06/2018   Coronary artery disease involving native coronary artery of native heart without angina pectoris 06/05/2018   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/30/2017   Vitamin D deficiency 01/30/2017   Benign non-nodular prostatic hyperplasia with lower urinary tract symptoms 09/29/2014   Nephrolithiasis 01/04/2012   PCP:  [name], MD Pharmacy:   [facility] [id number] - [place], [place] - [address] [place] [address] Phone: [phone] Fax: [phone]  [facility] [address] [place] [address] Phone: [phone] Fax: [phone]  [facility] - [place], [place] - [address] [place] [address] Phone: [phone] Fax: [phone]  [facility] [place] [facility] [address] [place] [address] Phone: [phone] Fax: [phone]     Social Drivers of Health (SDOH) Social History: SDOH Screenings   Food Insecurity: No Food Insecurity (05/07/2023)  Housing: Low Risk  (05/08/2023)  Transportation Needs: No Transportation Needs (05/08/2023)  Utilities: Not At Risk (05/08/2023)  Depression (PHQ2-9): Low Risk   (07/27/2022)  Tobacco Use: Medium Risk (05/05/2023)   SDOH Interventions:     Readmission Risk Interventions     No data to display

## 2023-05-10 NOTE — Progress Notes (Signed)
 Pt completed HD tx without issue and goal met.  05/10/23 1900  Vitals  Temp 97.9 F (36.6 C)  Temp Source Axillary  BP 122/61  BP Location Right Arm  BP Method Automatic  Patient Position (if appropriate) Lying  Pulse Rate (!) 55  Resp 16  Oxygen Therapy  SpO2 96 %  O2 Device Room Air  During Treatment Monitoring  Intra-Hemodialysis Comments Tx completed  Post Treatment  Dialyzer Clearance Lightly streaked  Hemodialysis Intake (mL) 0 mL  Liters Processed 70  Fluid Removed (mL) 2000 mL  Tolerated HD Treatment Yes  Post-Hemodialysis Comments Pt goal met  Hemodialysis Catheter Internal jugular  No placement date or time found.   Placed prior to admission: Yes  Access Location: Internal jugular  Site Condition No complications  Blue Lumen Status Heparin locked  Red Lumen Status Heparin locked  Catheter fill solution Heparin 1000 units/ml  Catheter fill volume (Arterial) 1.6 cc  Catheter fill volume (Venous) 1.6  Dressing Type Transparent  Dressing Status Antimicrobial disc/dressing in place;Clean, Dry, Intact  Interventions New dressing;Dressing changed  Drainage Description None  Dressing Change Due 05/17/23  Post treatment catheter status Capped and Clamped

## 2023-05-10 NOTE — Care Management Important Message (Signed)
 Important Message  Patient Details  Name: Kyle Roberson MRN: [id number] Date of Birth: [birth year]   Important Message Given:  Yes - Medicare IM     Cristela Blue, CMA 05/10/2023, 10:06 AM

## 2023-05-10 NOTE — Progress Notes (Signed)
 [facility]  ROUNDING NOTE   Subjective:   Kyle Roberson is a 83 y.o. male with diabetes mellitus type II, hypertension, coronary artery disease, chronic diastolic congestive heart failure, atrial fibrillation, pulmonary hypertension, and historyof subdural hematoma who is admitted for Weakness [R53.1] Fall, initial encounter [W19.XXXA] COVID-19 virus infection [U07.1] COVID-19 [U07.1] Uremia of renal origin [N19]  Patient is known to our practice and receives outpatient dialysis at [facility] on a MWF, supervised by Dr Cherylann Ratel. He received his last treatment on Wednesday.   Patient sitting up in bed Denies shortness of breath Room air Lower extremity edema at baseline  Dialysis scheduled for later today  Objective:  Vital signs in last 24 hours:  Temp:  [97.4 F (36.3 C)-97.7 F (36.5 C)] 97.4 F (36.3 C) (02/17 0829) Pulse Rate:  [44-58] 58 (02/17 0829) Resp:  [16-20] 18 (02/17 0829) BP: (110-133)/(78-92) 133/78 (02/17 0829) SpO2:  [92 %-99 %] 92 % (02/17 0829) Weight:  [97.3 kg] 97.3 kg (02/17 0500)  Weight change: 1 kg Filed Weights   05/07/23 1824 05/09/23 0500 05/10/23 0500  Weight: 94.8 kg 96.3 kg 97.3 kg    Intake/Output: I/O last 3 completed shifts: In: 480 [P.O.:480] Out: 0    Intake/Output this shift:  No intake/output data recorded.  Physical Exam: General: NAD  Head: Normocephalic, atraumatic. Moist oral mucosal membranes  Eyes: Anicteric  Lungs:  Clear to auscultation, room air  Heart: Regular rate and rhythm  Abdomen:  Soft, nontender  Extremities: 1+ peripheral edema.  Neurologic: Alert and oriented, moving all four extremities  Skin: Scaly bilateral lower extremities  Access: Right chest tunneled access    Basic Metabolic Panel: Recent Labs  Lab 05/05/23 1313 05/06/23 0019 05/06/23 0906 05/07/23 0749 05/08/23 0035 05/09/23 0536  NA 141 140  --  141 137 138  K 4.7 4.6  --  5.4* 3.7 4.4  CL 101 103  --  103 99 98   CO2 18* 15*  --  15* 19* 19*  GLUCOSE 203* 222*  --  100* 188* 181*  BUN 102* 115*  --  137* 78* 96*  CREATININE 10.02* 10.54*  --  11.98* 7.23* 9.05*  CALCIUM 7.3* 7.0*  --  7.0* 7.5* 7.7*  MG  --   --  1.9 1.9 1.7 1.9  PHOS  --   --  11.9* >30.0* 6.3* 8.5*    Liver Function Tests: Recent Labs  Lab 05/05/23 1313  AST 24  ALT 26  ALKPHOS 51  BILITOT 0.8  PROT 6.4*  ALBUMIN 3.2*   Recent Labs  Lab 05/05/23 1313  LIPASE 60*   No results for input(s): "AMMONIA" in the last 168 hours.  CBC: Recent Labs  Lab 05/05/23 1313 05/06/23 0019 05/07/23 0749 05/07/23 1044 05/07/23 2002 05/08/23 0035 05/08/23 1638 05/09/23 0536  WBC 5.6 6.4 8.9  --   --  7.5  --  5.0  HGB 10.9* 10.6* 9.2* 9.4* 9.7* 8.8* 9.0* 8.5*  HCT 33.4* 31.5* 27.2* 29.3* 28.6* 25.5* 27.0* 25.4*  MCV 97.7 96.0 95.4  --   --  92.7  --  95.1  PLT 197 191 154  --   --  154  --  155    Cardiac Enzymes: Recent Labs  Lab 05/08/23 0927 05/09/23 0536  CKTOTAL 94 75    BNP: Invalid input(s): "POCBNP"  CBG: Recent Labs  Lab 05/09/23 1320 05/09/23 1737 05/09/23 2103 05/10/23 0826 05/10/23 1143  GLUCAP 233* 272* 253* 138* 178*  Microbiology: Results for orders placed or performed during the hospital encounter of 05/05/23  Resp panel by RT-PCR (RSV, Flu A&B, Covid) Anterior Nasal Swab     Status: Abnormal   Collection Time: 05/05/23  4:33 PM   Specimen: Anterior Nasal Swab  Result Value Ref Range Status   SARS Coronavirus 2 by RT PCR POSITIVE (A) NEGATIVE Final    Comment: (NOTE) SARS-CoV-2 target nucleic acids are DETECTED.  The SARS-CoV-2 RNA is generally detectable in upper respiratory specimens during the acute phase of infection. Positive results are indicative of the presence of the identified virus, but do not rule out bacterial infection or co-infection with other pathogens not detected by the test. Clinical correlation with patient history and other diagnostic information is  necessary to determine patient infection status. The expected result is Negative.  Fact Sheet for Patients: BloggerCourse.com  Fact Sheet for Healthcare Providers: SeriousBroker.it  This test is not yet approved or cleared by the [place] FDA and  has been authorized for detection and/or diagnosis of SARS-CoV-2 by FDA under an Emergency Use Authorization (EUA).  This EUA will remain in effect (meaning this test can be used) for the duration of  the COVID-19 declaration under Section 564(b)(1) of the A ct, 21 U.S.C. section 360bbb-3(b)(1), unless the authorization is terminated or revoked sooner.     Influenza A by PCR NEGATIVE NEGATIVE Final   Influenza B by PCR NEGATIVE NEGATIVE Final    Comment: (NOTE) The Xpert Xpress SARS-CoV-2/FLU/RSV plus assay is intended as an aid in the diagnosis of influenza from Nasopharyngeal swab specimens and should not be used as a sole basis for treatment. Nasal washings and aspirates are unacceptable for Xpert Xpress SARS-CoV-2/FLU/RSV testing.  Fact Sheet for Patients: BloggerCourse.com  Fact Sheet for Healthcare Providers: SeriousBroker.it  This test is not yet approved or cleared by the [place] FDA and has been authorized for detection and/or diagnosis of SARS-CoV-2 by FDA under an Emergency Use Authorization (EUA). This EUA will remain in effect (meaning this test can be used) for the duration of the COVID-19 declaration under Section 564(b)(1) of the Act, 21 U.S.C. section 360bbb-3(b)(1), unless the authorization is terminated or revoked.     Resp Syncytial Virus by PCR NEGATIVE NEGATIVE Final    Comment: (NOTE) Fact Sheet for Patients: BloggerCourse.com  Fact Sheet for Healthcare Providers: SeriousBroker.it  This test is not yet approved or cleared by the [place] FDA  and has been authorized for detection and/or diagnosis of SARS-CoV-2 by FDA under an Emergency Use Authorization (EUA). This EUA will remain in effect (meaning this test can be used) for the duration of the COVID-19 declaration under Section 564(b)(1) of the Act, 21 U.S.C. section 360bbb-3(b)(1), unless the authorization is terminated or revoked.  Performed at [facility], [address] [place], [place] [address]   C Difficile Quick Screen w PCR reflex     Status: None   Collection Time: 05/05/23  7:35 PM   Specimen: STOOL  Result Value Ref Range Status   C Diff antigen NEGATIVE NEGATIVE Final   C Diff toxin NEGATIVE NEGATIVE Final   C Diff interpretation No C. difficile detected.  Final    Comment: Performed at [facility], [address] [place], [place] [address]    Coagulation Studies: No results for input(s): "LABPROT", "INR" in the last 72 hours.   Urinalysis: No results for input(s): "COLORURINE", "LABSPEC", "PHURINE", "GLUCOSEU", "HGBUR", "BILIRUBINUR", "KETONESUR", "PROTEINUR", "UROBILINOGEN", "NITRITE", "LEUKOCYTESUR" in the last 72  hours.  Invalid input(s): "APPERANCEUR"    Imaging: No results found.    Medications:      acetaminophen  650 mg Oral TID   apixaban  2.5 mg Oral BID   arformoterol  15 mcg Nebulization BID   ascorbic acid  250 mg Oral Daily   budesonide (PULMICORT) nebulizer solution  0.25 mg Nebulization BID   calcium acetate  1,334 mg Oral TID WC   Chlorhexidine Gluconate Cloth  6 each Topical Daily   clotrimazole-betamethasone   Topical BID   dorzolamide-timolol  1 drop Both Eyes Daily   epoetin alfa-epbx (RETACRIT) injection  4,000 Units Intravenous Q M,W,F-HD   fluticasone furoate-vilanterol  1 puff Inhalation Daily   HYDROmorphone  2 mg Oral Once   insulin aspart  0-5 Units Subcutaneous QHS   insulin aspart  0-9 Units Subcutaneous TID WC   ipratropium-albuterol  3 mL Nebulization Q6H   multivitamin  1 tablet  Oral QHS   neomycin-polymyxin b-dexamethasone  1 Application Right Eye TID   pantoprazole (PROTONIX) IV  40 mg Intravenous Q12H   QUEtiapine  25 mg Oral BID   rosuvastatin  10 mg Oral Once per day on Monday Wednesday Friday   traZODone  50 mg Oral QHS   [START ON 05/11/2023] acetaminophen, albuterol, dextromethorphan-guaiFENesin, hydrALAZINE, HYDROmorphone **OR** HYDROmorphone (DILAUDID) injection, loperamide, nitroGLYCERIN, ondansetron (ZOFRAN) IV  Assessment/ Plan:  [name] is a 83 y.o.  male with diabetes mellitus type II, hypertension, coronary artery disease, chronic diastolic congestive heart failure, atrial fibrillation, pulmonary hypertension, and historyof subdural hematoma who is admitted for Weakness [R53.1] Fall, initial encounter [W19.XXXA] COVID-19 virus infection [U07.1] COVID-19 [U07.1] Uremia of renal origin [N19]  [facility]/MWF/right chest PermCath  End-stage renal disease on hemodialysis.  Will receive dialysis later today.  COVID-19, found positive this admission.  Continue supportive care. Remains on room air  3. Anemia of chronic kidney disease  Lab Results  Component Value Date   HGB 8.5 (L) 05/09/2023    Hemoglobin 8.5.  Low dose EPO with dialysis.  Patient receives Mircera at outpatient clinic.   4. Secondary Hyperparathyroidism: with outpatient labs: None available  Lab Results  Component Value Date   PTH 344 (H) 01/07/2023   CALCIUM 7.7 (L) 05/09/2023   CAION 1.14 (L) 12/02/2022   PHOS 8.5 (H) 05/09/2023    Will continue to monitor bone minerals during this admission.  Hyperphosphatemia.  Continue prescribed binders for now. Will obtain updated labs during this admission.    LOS: 3 Deara Bober 2/17/202512:00 PM

## 2023-05-10 NOTE — Plan of Care (Signed)
  Problem: Education: Goal: Knowledge of risk factors and measures for prevention of condition will improve Outcome: Progressing   Problem: Coping: Goal: Psychosocial and spiritual needs will be supported Outcome: Progressing   Problem: Respiratory: Goal: Will maintain a patent airway Outcome: Progressing Goal: Complications related to the disease process, condition or treatment will be avoided or minimized Outcome: Progressing   Problem: Education: Goal: Knowledge of General Education information will improve Description: Including pain rating scale, medication(s)/side effects and non-pharmacologic comfort measures Outcome: Progressing   Problem: Health Behavior/Discharge Planning: Goal: Ability to manage health-related needs will improve Outcome: Progressing   Problem: Clinical Measurements: Goal: Ability to maintain clinical measurements within normal limits will improve Outcome: Progressing Goal: Will remain free from infection Outcome: Progressing Goal: Diagnostic test results will improve Outcome: Progressing Goal: Respiratory complications will improve Outcome: Progressing Goal: Cardiovascular complication will be avoided Outcome: Progressing   Problem: Activity: Goal: Risk for activity intolerance will decrease Outcome: Progressing   Problem: Nutrition: Goal: Adequate nutrition will be maintained Outcome: Progressing   Problem: Coping: Goal: Level of anxiety will decrease Outcome: Progressing   Problem: Elimination: Goal: Will not experience complications related to bowel motility Outcome: Progressing Goal: Will not experience complications related to urinary retention Outcome: Progressing   Problem: Pain Managment: Goal: General experience of comfort will improve and/or be controlled Outcome: Progressing   Problem: Safety: Goal: Ability to remain free from injury will improve Outcome: Progressing   Problem: Skin Integrity: Goal: Risk for impaired  skin integrity will decrease Outcome: Progressing   Problem: Education: Goal: Ability to describe self-care measures that may prevent or decrease complications (Diabetes Survival Skills Education) will improve Outcome: Progressing Goal: Individualized Educational Video(s) Outcome: Progressing   Problem: Coping: Goal: Ability to adjust to condition or change in health will improve Outcome: Progressing   Problem: Fluid Volume: Goal: Ability to maintain a balanced intake and output will improve Outcome: Progressing   Problem: Health Behavior/Discharge Planning: Goal: Ability to identify and utilize available resources and services will improve Outcome: Progressing Goal: Ability to manage health-related needs will improve Outcome: Progressing   Problem: Metabolic: Goal: Ability to maintain appropriate glucose levels will improve Outcome: Progressing   Problem: Nutritional: Goal: Maintenance of adequate nutrition will improve Outcome: Progressing Goal: Progress toward achieving an optimal weight will improve Outcome: Progressing   Problem: Skin Integrity: Goal: Risk for impaired skin integrity will decrease Outcome: Progressing   Problem: Tissue Perfusion: Goal: Adequacy of tissue perfusion will improve Outcome: Progressing

## 2023-05-10 NOTE — Plan of Care (Signed)
  Problem: Respiratory: Goal: Will maintain a patent airway Outcome: Progressing   

## 2023-05-11 DIAGNOSIS — U071 COVID-19: Secondary | ICD-10-CM | POA: Diagnosis not present

## 2023-05-11 LAB — BASIC METABOLIC PANEL
Anion gap: 17 — ABNORMAL HIGH (ref 5–15)
BUN: 75 mg/dL — ABNORMAL HIGH (ref 8–23)
CO2: 22 mmol/L (ref 22–32)
Calcium: 7.6 mg/dL — ABNORMAL LOW (ref 8.9–10.3)
Chloride: 96 mmol/L — ABNORMAL LOW (ref 98–111)
Creatinine, Ser: 7.12 mg/dL — ABNORMAL HIGH (ref 0.61–1.24)
GFR, Estimated: 7 mL/min — ABNORMAL LOW (ref >60)
Glucose, Bld: 175 mg/dL — ABNORMAL HIGH (ref 70–99)
Potassium: 4.3 mmol/L (ref 3.5–5.1)
Sodium: 135 mmol/L (ref 135–145)

## 2023-05-11 LAB — CBC
HCT: 25 % — ABNORMAL LOW (ref 39.0–52.0)
Hemoglobin: 8.3 g/dL — ABNORMAL LOW (ref 13.0–17.0)
MCH: 31.7 pg (ref 26.0–34.0)
MCHC: 33.2 g/dL (ref 30.0–36.0)
MCV: 95.4 fL (ref 80.0–100.0)
Platelets: 172 10*3/uL (ref 150–400)
RBC: 2.62 MIL/uL — ABNORMAL LOW (ref 4.22–5.81)
RDW: 13.4 % (ref 11.5–15.5)
WBC: 6.8 10*3/uL (ref 4.0–10.5)
nRBC: 0 % (ref 0.0–0.2)

## 2023-05-11 LAB — PHOSPHORUS: Phosphorus: 6.1 mg/dL — ABNORMAL HIGH (ref 2.5–4.6)

## 2023-05-11 LAB — GLUCOSE, CAPILLARY
Glucose-Capillary: 215 mg/dL — ABNORMAL HIGH (ref 70–99)
Glucose-Capillary: 272 mg/dL — ABNORMAL HIGH (ref 70–99)
Glucose-Capillary: 290 mg/dL — ABNORMAL HIGH (ref 70–99)
Glucose-Capillary: 347 mg/dL — ABNORMAL HIGH (ref 70–99)

## 2023-05-11 LAB — MAGNESIUM: Magnesium: 1.8 mg/dL (ref 1.7–2.4)

## 2023-05-11 MED ORDER — INSULIN GLARGINE-YFGN 100 UNIT/ML ~~LOC~~ SOLN
5.0000 [IU] | Freq: Every day | SUBCUTANEOUS | Status: DC
  Administered 2023-05-12 – 2023-05-24 (×8): 5 [IU] via SUBCUTANEOUS
  Filled 2023-05-11 (×15): qty 0.05

## 2023-05-11 MED ORDER — INSULIN ASPART 100 UNIT/ML IJ SOLN
4.0000 [IU] | Freq: Three times a day (TID) | INTRAMUSCULAR | Status: DC
  Administered 2023-05-12 – 2023-05-24 (×20): 4 [IU] via SUBCUTANEOUS
  Filled 2023-05-11 (×23): qty 1

## 2023-05-11 NOTE — Progress Notes (Signed)
 Pt's wife, [name] updated on her husbands condition and current plan of care.  Pt's wife, [name], would like an update on pt's condition today, info relayed to attending Dr. Lucianne Muss via secure chat at this time. Primary nurse also aware.

## 2023-05-11 NOTE — Inpatient Diabetes Management (Signed)
 Inpatient Diabetes Program Recommendations  AACE/ADA: New Consensus Statement on Inpatient Glycemic Control   Target Ranges:  Prepandial:   less than 140 mg/dL      Peak postprandial:   less than 180 mg/dL (1-2 hours)      Critically ill patients:  140 - 180 mg/dL    Latest Reference Range & Units 05/11/23 08:00  Glucose 70 - 99 mg/dL 604 (H)    Latest Reference Range & Units 05/10/23 08:26 05/10/23 11:43 05/10/23 16:27 05/10/23 21:41  Glucose-Capillary 70 - 99 mg/dL 540 (H) 981 (H) 191 (H) 322 (H)    Latest Reference Range & Units 05/09/23 08:13 05/09/23 13:20 05/09/23 17:37 05/09/23 21:03  Glucose-Capillary 70 - 99 mg/dL 478 (H) 295 (H) 621 (H) 253 (H)   Review of Glycemic Control  Diabetes history: DM2 Outpatient Diabetes medications: None Current orders for Inpatient glycemic control: Novolog 0-9 units TID with meals, Novolog 0-5 units at bedtime; Decadron 6 mg daily (to start today)  Inpatient Diabetes Program Recommendations:    Insulin: If steroids are continued as ordered, please consider ordering Semglee 5 units daily and Novolog 4 units TID with meals for meal coverage if patient eats at least 50% of meals.  Thanks, Orlando Penner, RN, MSN, CDCES Diabetes Coordinator Inpatient Diabetes Program [phone] (Team Pager from 8am to 5pm)

## 2023-05-11 NOTE — Plan of Care (Signed)
  Problem: Respiratory: Goal: Will maintain a patent airway Outcome: Progressing   

## 2023-05-11 NOTE — Progress Notes (Signed)
 [facility]  ROUNDING NOTE   Subjective:   Kyle Roberson is a 83 y.o. male with diabetes mellitus type II, hypertension, coronary artery disease, chronic diastolic congestive heart failure, atrial fibrillation, pulmonary hypertension, and historyof subdural hematoma who is admitted for Weakness [R53.1] Fall, initial encounter [W19.XXXA] COVID-19 virus infection [U07.1] COVID-19 [U07.1] Uremia of renal origin [N19]  Patient is known to our practice and receives outpatient dialysis at [facility] on a MWF, supervised by Dr Cherylann Ratel. He received his last treatment on Wednesday.   Patient seen sitting up in bed Alert and oriented, pleasant today States dialysis went well yesterday Reports poor oral intake Room air  Objective:  Vital signs in last 24 hours:  Temp:  [97.6 F (36.4 C)-99 F (37.2 C)] 97.6 F (36.4 C) (02/18 0754) Pulse Rate:  [51-65] 53 (02/18 0754) Resp:  [16-20] 16 (02/18 0754) BP: (110-148)/(56-92) 115/58 (02/18 0754) SpO2:  [95 %-98 %] 97 % (02/18 0754) Weight:  [95.5 kg-97.9 kg] 95.5 kg (02/18 0500)  Weight change: 0.6 kg Filed Weights   05/10/23 1558 05/10/23 1944 05/11/23 0500  Weight: 97.9 kg 95.9 kg 95.5 kg    Intake/Output: I/O last 3 completed shifts: In: 300 [P.O.:300] Out: 2000 [Other:2000]   Intake/Output this shift:  No intake/output data recorded.  Physical Exam: General: NAD  Head: Normocephalic, atraumatic. Moist oral mucosal membranes  Eyes: Anicteric  Lungs:  Clear to auscultation, room air  Heart: Regular rate and rhythm  Abdomen:  Soft, nontender  Extremities: 1+ peripheral edema.  Neurologic: Alert and oriented, moving all four extremities  Skin: Scaly bilateral lower extremities  Access: Right chest tunneled access    Basic Metabolic Panel: Recent Labs  Lab 05/07/23 0749 05/08/23 0035 05/09/23 0536 05/10/23 1220 05/11/23 0800  NA 141 137 138 138 135  K 5.4* 3.7 4.4 4.4 4.3  CL 103 99 98 99 96*   CO2 15* 19* 19* 21* 22  GLUCOSE 100* 188* 181* 179* 175*  BUN 137* 78* 96* 119* 75*  CREATININE 11.98* 7.23* 9.05* 10.78* 7.12*  CALCIUM 7.0* 7.5* 7.7* 7.6* 7.6*  MG 1.9 1.7 1.9 2.0 1.8  PHOS >30.0* 6.3* 8.5* 8.5* 6.1*    Liver Function Tests: Recent Labs  Lab 05/05/23 1313  AST 24  ALT 26  ALKPHOS 51  BILITOT 0.8  PROT 6.4*  ALBUMIN 3.2*   Recent Labs  Lab 05/05/23 1313  LIPASE 60*   No results for input(s): "AMMONIA" in the last 168 hours.  CBC: Recent Labs  Lab 05/07/23 0749 05/07/23 1044 05/08/23 0035 05/08/23 1638 05/09/23 0536 05/10/23 1220 05/11/23 0800  WBC 8.9  --  7.5  --  5.0 7.6 6.8  HGB 9.2*   < > 8.8* 9.0* 8.5* 8.4* 8.3*  HCT 27.2*   < > 25.5* 27.0* 25.4* 25.0* 25.0*  MCV 95.4  --  92.7  --  95.1 94.3 95.4  PLT 154  --  154  --  155 169 172   < > = values in this interval not displayed.    Cardiac Enzymes: Recent Labs  Lab 05/08/23 0927 05/09/23 0536  CKTOTAL 94 75    BNP: Invalid input(s): "POCBNP"  CBG: Recent Labs  Lab 05/10/23 0826 05/10/23 1143 05/10/23 1627 05/10/23 2141 05/11/23 1130  GLUCAP 138* 178* 206* 322* 215*    Microbiology: Results for orders placed or performed during the hospital encounter of 05/05/23  Resp panel by RT-PCR (RSV, Flu A&B, Covid) Anterior Nasal Swab  Status: Abnormal   Collection Time: 05/05/23  4:33 PM   Specimen: Anterior Nasal Swab  Result Value Ref Range Status   SARS Coronavirus 2 by RT PCR POSITIVE (A) NEGATIVE Final    Comment: (NOTE) SARS-CoV-2 target nucleic acids are DETECTED.  The SARS-CoV-2 RNA is generally detectable in upper respiratory specimens during the acute phase of infection. Positive results are indicative of the presence of the identified virus, but do not rule out bacterial infection or co-infection with other pathogens not detected by the test. Clinical correlation with patient history and other diagnostic information is necessary to determine  patient infection status. The expected result is Negative.  Fact Sheet for Patients: BloggerCourse.com  Fact Sheet for Healthcare Providers: SeriousBroker.it  This test is not yet approved or cleared by the [place] FDA and  has been authorized for detection and/or diagnosis of SARS-CoV-2 by FDA under an Emergency Use Authorization (EUA).  This EUA will remain in effect (meaning this test can be used) for the duration of  the COVID-19 declaration under Section 564(b)(1) of the A ct, 21 U.S.C. section 360bbb-3(b)(1), unless the authorization is terminated or revoked sooner.     Influenza A by PCR NEGATIVE NEGATIVE Final   Influenza B by PCR NEGATIVE NEGATIVE Final    Comment: (NOTE) The Xpert Xpress SARS-CoV-2/FLU/RSV plus assay is intended as an aid in the diagnosis of influenza from Nasopharyngeal swab specimens and should not be used as a sole basis for treatment. Nasal washings and aspirates are unacceptable for Xpert Xpress SARS-CoV-2/FLU/RSV testing.  Fact Sheet for Patients: BloggerCourse.com  Fact Sheet for Healthcare Providers: SeriousBroker.it  This test is not yet approved or cleared by the [place] FDA and has been authorized for detection and/or diagnosis of SARS-CoV-2 by FDA under an Emergency Use Authorization (EUA). This EUA will remain in effect (meaning this test can be used) for the duration of the COVID-19 declaration under Section 564(b)(1) of the Act, 21 U.S.C. section 360bbb-3(b)(1), unless the authorization is terminated or revoked.     Resp Syncytial Virus by PCR NEGATIVE NEGATIVE Final    Comment: (NOTE) Fact Sheet for Patients: BloggerCourse.com  Fact Sheet for Healthcare Providers: SeriousBroker.it  This test is not yet approved or cleared by the [place] FDA and has been  authorized for detection and/or diagnosis of SARS-CoV-2 by FDA under an Emergency Use Authorization (EUA). This EUA will remain in effect (meaning this test can be used) for the duration of the COVID-19 declaration under Section 564(b)(1) of the Act, 21 U.S.C. section 360bbb-3(b)(1), unless the authorization is terminated or revoked.  Performed at [facility], [address] [place], [place] [address]   C Difficile Quick Screen w PCR reflex     Status: None   Collection Time: 05/05/23  7:35 PM   Specimen: STOOL  Result Value Ref Range Status   C Diff antigen NEGATIVE NEGATIVE Final   C Diff toxin NEGATIVE NEGATIVE Final   C Diff interpretation No C. difficile detected.  Final    Comment: Performed at [facility], [address] [place], [place] [address]    Coagulation Studies: No results for input(s): "LABPROT", "INR" in the last 72 hours.   Urinalysis: No results for input(s): "COLORURINE", "LABSPEC", "PHURINE", "GLUCOSEU", "HGBUR", "BILIRUBINUR", "KETONESUR", "PROTEINUR", "UROBILINOGEN", "NITRITE", "LEUKOCYTESUR" in the last 72 hours.  Invalid input(s): "APPERANCEUR"    Imaging: No results found.    Medications:      acetaminophen  650 mg Oral TID  apixaban  2.5 mg Oral BID   arformoterol  15 mcg Nebulization BID   ascorbic acid  250 mg Oral Daily   budesonide (PULMICORT) nebulizer solution  0.25 mg Nebulization BID   calcium acetate  1,334 mg Oral TID WC   Chlorhexidine Gluconate Cloth  6 each Topical Daily   clotrimazole-betamethasone   Topical BID   dexamethasone  6 mg Oral Daily   dorzolamide-timolol  1 drop Both Eyes Daily   epoetin alfa-epbx (RETACRIT) injection  4,000 Units Intravenous Q M,W,F-HD   feeding supplement (NEPRO CARB STEADY)  237 mL Oral BID BM   fluticasone furoate-vilanterol  1 puff Inhalation Daily   HYDROmorphone  2 mg Oral Once   insulin aspart  0-5 Units Subcutaneous QHS   insulin aspart  0-9 Units Subcutaneous  TID WC   insulin aspart  4 Units Subcutaneous TID WC   insulin glargine-yfgn  5 Units Subcutaneous Daily   ipratropium-albuterol  3 mL Nebulization BID   multivitamin  1 tablet Oral QHS   neomycin-polymyxin b-dexamethasone  1 Application Right Eye TID   pantoprazole (PROTONIX) IV  40 mg Intravenous Q12H   QUEtiapine  25 mg Oral BID   rosuvastatin  10 mg Oral Once per day on Monday Wednesday Friday   traZODone  50 mg Oral QHS   acetaminophen, albuterol, dextromethorphan-guaiFENesin, hydrALAZINE, HYDROmorphone **OR** HYDROmorphone (DILAUDID) injection, loperamide, nitroGLYCERIN, ondansetron (ZOFRAN) IV  Assessment/ Plan:  Mr. Kameren Pargas is a 83 y.o.  male with diabetes mellitus type II, hypertension, coronary artery disease, chronic diastolic congestive heart failure, atrial fibrillation, pulmonary hypertension, and historyof subdural hematoma who is admitted for Weakness [R53.1] Fall, initial encounter [W19.XXXA] COVID-19 virus infection [U07.1] COVID-19 [U07.1] Uremia of renal origin [N19]  [facility]/MWF/right chest PermCath  End-stage renal disease on hemodialysis.  Dialysis received yesterday, UF 2 L achieved.  Next treatment scheduled for Wednesday.  COVID-19, found positive this admission.  Continue supportive care. Room air  3. Anemia of chronic kidney disease  Lab Results  Component Value Date   HGB 8.3 (L) 05/11/2023    Hemoglobin 8.3.  Low dose EPO with dialysis.  Patient receives Mircera at outpatient clinic.   4. Secondary Hyperparathyroidism: with outpatient labs: None available  Lab Results  Component Value Date   PTH 344 (H) 01/07/2023   CALCIUM 7.6 (L) 05/11/2023   CAION 1.14 (L) 12/02/2022   PHOS 6.1 (H) 05/11/2023    Will continue to monitor bone minerals during this admission.  Hyperphosphatemia slowly improving.  Continue prescribed binders for now.   LOS: 4 Adora Yeh 2/18/202511:41 AM

## 2023-05-11 NOTE — Plan of Care (Signed)
  Problem: Clinical Measurements: Goal: Will remain free from infection Outcome: Progressing   Problem: Respiratory: Goal: Will maintain a patent airway 05/11/2023 1903 by Jones Skene, RN Outcome: Progressing

## 2023-05-11 NOTE — Progress Notes (Signed)
 Pt is refusing to have blood sugars checked. He is being combative and yelling to be left alone.

## 2023-05-11 NOTE — Plan of Care (Signed)
  Problem: Respiratory: Goal: Will maintain a patent airway Outcome: Progressing Goal: Complications related to the disease process, condition or treatment will be avoided or minimized Outcome: Progressing   Problem: Education: Goal: Knowledge of General Education information will improve Description: Including pain rating scale, medication(s)/side effects and non-pharmacologic comfort measures Outcome: Progressing   

## 2023-05-11 NOTE — Progress Notes (Signed)
 [facility] Progress Note  Patient: Kyle Roberson    BMW:[id number]  DOA: 05/05/2023     Date of Service: the patient was seen and examined on 05/11/2023  Chief Complaint  Patient presents with   Fall   Brief hospital course:  Kyle Roberson is a 83 y.o. male with medical history significant of ESRD-HD (MWF), HTN, HLD, DM, CAD with stent, dCHF, depression with anxiety, BPH, anemia, A-fib on Eliquis, left eye blindness, who presents with cough, shortness of breath, diarrhea, fall.   Per pt and his wife (I called his wife by phone), patient has productive cough, sore throat, SOB, malaise for more than 5 days.  No chest pain.  No fever or chills.  Patient has diarrhea for more than 10 days, with 2- 3 times of watery diarrhea each day.  No nausea, vomiting or abdominal pain.  Patient fell this morning, no loss of consciousness. He patient states that he was going to the bathroom when he suddenly felt weak in his gave out and fell.  No unilateral numbness or tingling in extremities.  No facial droop or slurred speech.  Patient states that he has missed several dialysis. His last dialysis was on Monday.     Data reviewed independently and ED Course: pt was found to have positive COVID 19, WBC 5.6, troponin 95 --> 105, potassium 4.7, bicarbonate 18, creatinine 10.02, BUN 102.  CT head negative. Chest x-ray showed cardiomegaly with vascular congestion.  Patient is placed on telemetry bed for observation.     EKG: I have personally reviewed.  A-fib, QTc 461, low voltage, poor IV progression, anteroseptal infarction pattern.   Assessment and Plan:  # COVID-19 virus infection: No fever, no leukocytosis, no oxygen desaturation.   Chest x-ray showed vascular congestion, no infiltration. -Bronchodilators and as needed Mucinex -Supportive care 2/15 still sob after HD, may benefit from steroids and breathing treatments S/p Prednisone 40 mg p.o. daily x 3 days, Breo Ellipta inhaler Continue  albuterol as needed 2/16 patient is refusing inhalers, started on nebulizer treatments if he agrees. 2/17 started Decadron 6 mg p.o. daily for 7 days  # CAD (coronary artery disease) and Myocardial injury:  Troponin 894, but No chest pain and no significant EKG changes Continue to monitor on telemetry S/p ASA 81 mg and heparin IV infusion, discontinued on 2/14, due to GI bleed 2/14 continue to hold Eliquis for now Cardiology consulted, recommended no ischemic workup at this time, follow as an outpatient for Myoview when recovered after COVID infection TTE shows LVEF 55 to 60%, grade 2 diastolic dysfunction, no wall motion abnormality.  Severe pulmonary hypertension, RA moderately dilated.  Moderate TR As per cardio patient may need right and left heart cath due to pulmonary hypertension and elevated troponin.  # Paroxysmal atrial fibrillation: -Tele monitoring - s/p heparin IV infusion, Hb dropped, suspected GI bleed.  Patient was cleared by GI to continue heparin IV infusion.  2/15 resumed Eliquis 2.5 mg p.o. 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 nightly and Seroquel 25 mg p.o. twice daily   # Fall at home, initial encounter: CT-head negative -Fall precaution, CK level wnl -PT/OT eval pending  # Type II diabetes mellitus with renal manifestations:  Recent A1c 7.3, poorly controlled for patient taking glipizide at home -SSI 2/18 started Semglee 5  units subcu daily and NovoLog 4 units 3 times daily due to hyperglycemia secondary to steroids. Follow diabetic coordinator and titrate insulin dose accordingly. Monitor CBG and continue diabetic diet   # Essential hypertension: Patient's not taking medications.   Nonsudden V. tach episode 7 beats run at 630 on 2/13 reported 2/13 started metoprolol 12.5 mg p.o. twice daily -IV hydralazine as needed   # Chronic diastolic CHF, severe pulmonary hypertension TTE done as above 2/15 Lasix 80 mg x 2 doses ordered by cardiology Fluid management via hemodialysis   # Dyslipidemia: on Crestor   # Diarrhea: Possibly due to COVID infection -As needed Imodium -C. 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  Interventions:  Diet: Renal diet DVT Prophylaxis: Heparin IV infusion  Advance goals of care discussion: Full code  Family Communication: family was not present at bedside, at the time of interview.  The pt provided permission to discuss medical plan with the family. Opportunity was given to ask question and all questions were answered satisfactorily.   Disposition:  Pt is from home, admitted with COVID viral infection, missed hemodialysis, elevated troponin s/p IV heparin infusion, which precludes a safe discharge. Discharge to SNF, when stable, may need few days to improve. PT and OT eval Follow TOC for placement   Subjective: No significant events overnight.  Patient slept well last night, breathing is improving, denied any specific complaints.   Physical Exam: General: Mild respiratory distress, improved as compared to yesterday affect anxious and depressed  Eyes: PERRLA ENT: Oral Mucosa Clear, moist  Neck: no JVD,  Cardiovascular: S1 and S2 Present, no Murmur,  Respiratory: Equal air entry bilaterally, mild bibasilar  crackles, No wheezes  Abdomen: Bowel Sound present, Soft and no tenderness,  Skin: no rashes Extremities: Chronic lymphedema and chronic skin changes, dry scaly skin, improving Neurologic: without any new focal findings Gait not checked due to patient safety concerns  Vitals:   05/10/23 1949 05/10/23 2101 05/11/23 0500 05/11/23 0754  BP:  (!) 148/92  (!) 115/58  Pulse:  (!) 58  (!) 53  Resp:  17  16  Temp:  99 F (37.2 C)  97.6 F (36.4 C)  TempSrc:  Oral  Oral  SpO2: 96% 97%  97%  Weight:   95.5 kg   Height:        Intake/Output Summary (Last 24 hours) at 05/11/2023 1745 Last data filed at 05/11/2023 0900 Gross per 24 hour  Intake 180 ml  Output 2000 ml  Net -1820 ml   Filed Weights   05/10/23 1558 05/10/23 1944 05/11/23 0500  Weight: 97.9 kg 95.9 kg 95.5 kg    Data Reviewed: I have personally reviewed and interpreted daily labs, tele strips, imagings as discussed above. I reviewed all nursing notes, pharmacy notes, vitals, pertinent old records I have discussed plan of care as described above with RN and patient/family.  CBC: Recent Labs  Lab 05/07/23 0749 05/07/23 1044 05/08/23 0035 05/08/23 1638 05/09/23 0536 05/10/23 1220 05/11/23 0800  WBC 8.9  --  7.5  --  5.0 7.6 6.8  HGB 9.2*   < > 8.8* 9.0* 8.5* 8.4* 8.3*  HCT 27.2*   < > 25.5* 27.0* 25.4* 25.0* 25.0*  MCV 95.4  --  92.7  --  95.1 94.3 95.4  PLT 154  --  154  --  155 169 172   < > = values in this interval not displayed.   Basic Metabolic Panel: Recent Labs  Lab 05/07/23 0749 05/08/23 0035 05/09/23 0536 05/10/23 1220 05/11/23 0800  NA 141 137 138 138 135  K 5.4* 3.7 4.4 4.4 4.3  CL 103 99 98 99 96*  CO2 15* 19* 19* 21* 22  GLUCOSE 100* 188* 181* 179* 175*  BUN 137* 78* 96* 119* 75*  CREATININE 11.98* 7.23* 9.05* 10.78* 7.12*  CALCIUM 7.0* 7.5* 7.7* 7.6* 7.6*  MG 1.9 1.7 1.9 2.0 1.8  PHOS >30.0* 6.3* 8.5* 8.5* 6.1*    Studies: No results found.   Scheduled Meds:  apixaban  2.5 mg  Oral BID   arformoterol  15 mcg Nebulization BID   ascorbic acid  250 mg Oral Daily   budesonide (PULMICORT) nebulizer solution  0.25 mg Nebulization BID   calcium acetate  1,334 mg Oral TID WC   Chlorhexidine Gluconate Cloth  6 each Topical Daily   clotrimazole-betamethasone   Topical BID   dexamethasone  6 mg Oral Daily   dorzolamide-timolol  1 drop Both Eyes Daily   epoetin alfa-epbx (RETACRIT) injection  4,000 Units Intravenous Q M,W,F-HD   feeding supplement (NEPRO CARB STEADY)  237 mL Oral BID BM   fluticasone furoate-vilanterol  1 puff Inhalation Daily   HYDROmorphone  2 mg Oral Once   insulin aspart  0-5 Units Subcutaneous QHS   insulin aspart  0-9 Units Subcutaneous TID WC   insulin aspart  4 Units Subcutaneous TID WC   insulin glargine-yfgn  5 Units Subcutaneous Daily   ipratropium-albuterol  3 mL Nebulization BID   multivitamin  1 tablet Oral QHS   neomycin-polymyxin b-dexamethasone  1 Application Right Eye TID   pantoprazole (PROTONIX) IV  40 mg Intravenous Q12H   QUEtiapine  25 mg Oral BID   rosuvastatin  10 mg Oral Once per day on Monday Wednesday Friday   traZODone  50 mg Oral QHS   Continuous Infusions:   PRN Meds: acetaminophen, albuterol, dextromethorphan-guaiFENesin, hydrALAZINE, HYDROmorphone **OR** HYDROmorphone (DILAUDID) injection, loperamide, nitroGLYCERIN, ondansetron (ZOFRAN) IV  Time spent: 40 minutes  Author: Gillis Santa. MD [facility] 05/11/2023 5:45 PM  To reach On-call, see care teams to locate the attending and reach out to them via [email or web address]. If 7PM-7AM, please contact night-coverage If you still have difficulty reaching the attending provider, please page the [facility] (Director on Call) for [facility] on amion for assistance.

## 2023-05-12 DIAGNOSIS — U071 COVID-19: Secondary | ICD-10-CM | POA: Diagnosis not present

## 2023-05-12 LAB — CBC
HCT: 26.3 % — ABNORMAL LOW (ref 39.0–52.0)
Hemoglobin: 8.7 g/dL — ABNORMAL LOW (ref 13.0–17.0)
MCH: 31.6 pg (ref 26.0–34.0)
MCHC: 33.1 g/dL (ref 30.0–36.0)
MCV: 95.6 fL (ref 80.0–100.0)
Platelets: 170 10*3/uL (ref 150–400)
RBC: 2.75 MIL/uL — ABNORMAL LOW (ref 4.22–5.81)
RDW: 13.3 % (ref 11.5–15.5)
WBC: 7.5 10*3/uL (ref 4.0–10.5)
nRBC: 0 % (ref 0.0–0.2)

## 2023-05-12 LAB — GLUCOSE, CAPILLARY
Glucose-Capillary: 228 mg/dL — ABNORMAL HIGH (ref 70–99)
Glucose-Capillary: 287 mg/dL — ABNORMAL HIGH (ref 70–99)
Glucose-Capillary: 189 mg/dL — ABNORMAL HIGH (ref 70–99)

## 2023-05-12 LAB — BASIC METABOLIC PANEL
Anion gap: 17 — ABNORMAL HIGH (ref 5–15)
BUN: 98 mg/dL — ABNORMAL HIGH (ref 8–23)
CO2: 21 mmol/L — ABNORMAL LOW (ref 22–32)
Calcium: 7.7 mg/dL — ABNORMAL LOW (ref 8.9–10.3)
Chloride: 99 mmol/L (ref 98–111)
Creatinine, Ser: 8.58 mg/dL — ABNORMAL HIGH (ref 0.61–1.24)
GFR, Estimated: 6 mL/min — ABNORMAL LOW (ref >60)
Glucose, Bld: 234 mg/dL — ABNORMAL HIGH (ref 70–99)
Potassium: 5 mmol/L (ref 3.5–5.1)
Sodium: 137 mmol/L (ref 135–145)

## 2023-05-12 MED ORDER — HEPARIN SODIUM (PORCINE) 1000 UNIT/ML IJ SOLN
INTRAMUSCULAR | Status: AC
  Filled 2023-05-12: qty 10

## 2023-05-12 MED ORDER — BISACODYL 10 MG RE SUPP: 10.0000 mg | Freq: Every day | RECTAL | Status: DC | PRN

## 2023-05-12 MED ORDER — BISACODYL 5 MG PO TBEC: 10.0000 mg | DELAYED_RELEASE_TABLET | Freq: Once | ORAL | Status: DC

## 2023-05-12 MED ORDER — IPRATROPIUM-ALBUTEROL 20-100 MCG/ACT IN AERS
1.0000 | INHALATION_SPRAY | Freq: Four times a day (QID) | RESPIRATORY_TRACT | Status: DC
  Administered 2023-05-12: 1 via RESPIRATORY_TRACT
  Filled 2023-05-12: qty 4

## 2023-05-12 MED ORDER — BISACODYL 5 MG PO TBEC: 10.0000 mg | DELAYED_RELEASE_TABLET | Freq: Every day | ORAL | Status: DC

## 2023-05-12 MED ORDER — EPOETIN ALFA-EPBX 4000 UNIT/ML IJ SOLN
INTRAMUSCULAR | Status: AC
  Filled 2023-05-12: qty 1

## 2023-05-12 MED ORDER — MINERAL OIL RE ENEM: 1.0000 | ENEMA | Freq: Once | RECTAL | Status: DC

## 2023-05-12 MED ORDER — BISACODYL 10 MG RE SUPP: 10.0000 mg | Freq: Every day | RECTAL | Status: AC

## 2023-05-12 MED ORDER — POLYETHYLENE GLYCOL 3350 17 G PO PACK
17.0000 g | PACK | Freq: Two times a day (BID) | ORAL | Status: DC
  Administered 2023-05-12 – 2023-05-24 (×13): 17 g via ORAL
  Filled 2023-05-12 (×19): qty 1

## 2023-05-12 MED ORDER — BISACODYL 5 MG PO TBEC
10.0000 mg | DELAYED_RELEASE_TABLET | Freq: Every day | ORAL | Status: AC
  Administered 2023-05-12: 10 mg via ORAL
  Filled 2023-05-12: qty 2

## 2023-05-12 NOTE — Inpatient Diabetes Management (Signed)
 Inpatient Diabetes Program Recommendations  AACE/ADA: New Consensus Statement on Inpatient Glycemic Control   Target Ranges:  Prepandial:   less than 140 mg/dL      Peak postprandial:   less than 180 mg/dL (1-2 hours)      Critically ill patients:  140 - 180 mg/dL    Latest Reference Range & Units 05/11/23 11:30 05/11/23 17:16 05/11/23 17:59 05/11/23 22:04  Glucose-Capillary 70 - 99 mg/dL 161 (H) 096 (H) 045 (H) 347 (H)   Review of Glycemic Control  Diabetes history: DM2 Outpatient Diabetes medications: None Current orders for Inpatient glycemic control: Semglee 5 units daily, Novolog 0-9 units TID with meals, Novolog 0-5 units at bedtime, Novolog 4 units TID with meals; Decadron 6 mg daily (to start today)   Inpatient Diabetes Program Recommendations:    Insulin: In reviewing chart, noted patient refused Semglee on 2/18, refused Novolog meal coverage at 12:00, 17:00, and Novolog correction scale 8:00, 12:00 on 2/18.   Thanks, Orlando Penner, RN, MSN, CDCES Diabetes Coordinator Inpatient Diabetes Program [phone] (Team Pager from 8am to 5pm)

## 2023-05-12 NOTE — TOC Progression Note (Signed)
 Transition of Care [facility]) - Progression Note    Patient Details  Name: Kyle Roberson MRN: [id number] Date of Birth: 02-Dec-1940  Transition of [facility]) CM/SW Contact  Marlowe Sax, RN Phone Number: 05/12/2023, 9:30 AM  Clinical Narrative:     TOC continues to monitor needs, patient refuses to go to STR and refuses HH just wants to go home he stated  Expected Discharge Plan: Home/Self Care Barriers to Discharge: Continued Medical Work up  Expected Discharge Plan and Services   Discharge Planning Services: CM Consult   Living arrangements for the past 2 months: Single Family Home                 DME Arranged: N/A DME Agency: NA       HH Arranged: Refused SNF, Patient Refused HH HH Agency: NA         Social Determinants of Health (SDOH) Interventions SDOH Screenings   Food Insecurity: No Food Insecurity (05/07/2023)  Housing: Low Risk  (05/08/2023)  Transportation Needs: No Transportation Needs (05/08/2023)  Utilities: Not At Risk (05/08/2023)  Depression (PHQ2-9): Low Risk  (07/27/2022)  Tobacco Use: Medium Risk (05/05/2023)    Readmission Risk Interventions     No data to display

## 2023-05-12 NOTE — Progress Notes (Signed)
 Received patient in bed to unit.  Alert and oriented.  Informed consent signed and in chart.   TX duration:  Patient tolerated well.  Tx at [facility] d/t COVID Alert, without acute distress.  Hand-off given to patient's nurse.   Access used: RIJ Access issues: none  Total UF removed: 2500 ML Medication(s) given: EPO    Freddie Breech, RN [facility]

## 2023-05-12 NOTE — Plan of Care (Signed)
  Problem: Education: Goal: Knowledge of General Education information will improve Description: Including pain rating scale, medication(s)/side effects and non-pharmacologic comfort measures Outcome: Progressing   Problem: Clinical Measurements: Goal: Will remain free from infection Outcome: Progressing   Problem: Clinical Measurements: Goal: Respiratory complications will improve Outcome: Progressing   Problem: Clinical Measurements: Goal: Cardiovascular complication will be avoided Outcome: Progressing   Problem: Pain Managment: Goal: General experience of comfort will improve and/or be controlled Outcome: Progressing   Problem: Safety: Goal: Ability to remain free from injury will improve Outcome: Progressing

## 2023-05-12 NOTE — Progress Notes (Signed)
 [facility]  ROUNDING NOTE   Subjective:   Kyle Roberson is a 83 y.o. male with diabetes mellitus type II, hypertension, coronary artery disease, chronic diastolic congestive heart failure, atrial fibrillation, pulmonary hypertension, and historyof subdural hematoma who is admitted for Weakness [R53.1] Fall, initial encounter [W19.XXXA] COVID-19 virus infection [U07.1] COVID-19 [U07.1] Uremia of renal origin [N19]  Patient is known to our practice and receives outpatient dialysis at [facility] on a MWF, supervised by Dr Cherylann Ratel. He received his last treatment on Wednesday.   Patient seen sitting up in bed Currently eating breakfast Alert and oriented Remains on room air  Chart review notes patient continues to refuse various treatments and medications  Scheduled to receive dialysis later today  Objective:  Vital signs in last 24 hours:  Temp:  [97.4 F (36.3 C)-97.6 F (36.4 C)] 97.4 F (36.3 C) (02/19 0749) Pulse Rate:  [51-61] 51 (02/19 0749) Resp:  [18-20] 18 (02/19 0749) BP: (117-138)/(52-62) 117/52 (02/19 0749) SpO2:  [92 %-96 %] 95 % (02/19 0749) Weight:  [95.5 kg] 95.5 kg (02/19 0500)  Weight change: -2.4 kg Filed Weights   05/10/23 1944 05/11/23 0500 05/12/23 0500  Weight: 95.9 kg 95.5 kg 95.5 kg    Intake/Output: I/O last 3 completed shifts: In: 300 [P.O.:300] Out: -    Intake/Output this shift:  No intake/output data recorded.  Physical Exam: General: NAD  Head: Normocephalic, atraumatic. Moist oral mucosal membranes  Eyes: Anicteric  Lungs:  Clear to auscultation, room air  Heart: Regular rate and rhythm  Abdomen:  Soft, nontender  Extremities: 1+ peripheral edema.  Neurologic: Alert and oriented, moving all four extremities  Skin: Scaly bilateral lower extremities  Access: Right chest tunneled access    Basic Metabolic Panel: Recent Labs  Lab 05/07/23 0749 05/08/23 0035 05/09/23 0536 05/10/23 1220 05/11/23 0800  05/12/23 0809  NA 141 137 138 138 135 137  K 5.4* 3.7 4.4 4.4 4.3 5.0  CL 103 99 98 99 96* 99  CO2 15* 19* 19* 21* 22 21*  GLUCOSE 100* 188* 181* 179* 175* 234*  BUN 137* 78* 96* 119* 75* 98*  CREATININE 11.98* 7.23* 9.05* 10.78* 7.12* 8.58*  CALCIUM 7.0* 7.5* 7.7* 7.6* 7.6* 7.7*  MG 1.9 1.7 1.9 2.0 1.8  --   PHOS >30.0* 6.3* 8.5* 8.5* 6.1*  --     Liver Function Tests: No results for input(s): "AST", "ALT", "ALKPHOS", "BILITOT", "PROT", "ALBUMIN" in the last 168 hours.  No results for input(s): "LIPASE", "AMYLASE" in the last 168 hours.  No results for input(s): "AMMONIA" in the last 168 hours.  CBC: Recent Labs  Lab 05/08/23 0035 05/08/23 1638 05/09/23 0536 05/10/23 1220 05/11/23 0800 05/12/23 0809  WBC 7.5  --  5.0 7.6 6.8 7.5  HGB 8.8* 9.0* 8.5* 8.4* 8.3* 8.7*  HCT 25.5* 27.0* 25.4* 25.0* 25.0* 26.3*  MCV 92.7  --  95.1 94.3 95.4 95.6  PLT 154  --  155 169 172 170    Cardiac Enzymes: Recent Labs  Lab 05/08/23 0927 05/09/23 0536  CKTOTAL 94 75    BNP: Invalid input(s): "POCBNP"  CBG: Recent Labs  Lab 05/11/23 1716 05/11/23 1759 05/11/23 2204 05/12/23 0834 05/12/23 1145  GLUCAP 272* 290* 347* 228* 287*    Microbiology: Results for orders placed or performed during the hospital encounter of 05/05/23  Resp panel by RT-PCR (RSV, Flu A&B, Covid) Anterior Nasal Swab     Status: Abnormal   Collection Time: 05/05/23  4:33 PM  Specimen: Anterior Nasal Swab  Result Value Ref Range Status   SARS Coronavirus 2 by RT PCR POSITIVE (A) NEGATIVE Final    Comment: (NOTE) SARS-CoV-2 target nucleic acids are DETECTED.  The SARS-CoV-2 RNA is generally detectable in upper respiratory specimens during the acute phase of infection. Positive results are indicative of the presence of the identified virus, but do not rule out bacterial infection or co-infection with other pathogens not detected by the test. Clinical correlation with patient history and other  diagnostic information is necessary to determine patient infection status. The expected result is Negative.  Fact Sheet for Patients: BloggerCourse.com  Fact Sheet for Healthcare Providers: SeriousBroker.it  This test is not yet approved or cleared by the [place] FDA and  has been authorized for detection and/or diagnosis of SARS-CoV-2 by FDA under an Emergency Use Authorization (EUA).  This EUA will remain in effect (meaning this test can be used) for the duration of  the COVID-19 declaration under Section 564(b)(1) of the A ct, 21 U.S.C. section 360bbb-3(b)(1), unless the authorization is terminated or revoked sooner.     Influenza A by PCR NEGATIVE NEGATIVE Final   Influenza B by PCR NEGATIVE NEGATIVE Final    Comment: (NOTE) The Xpert Xpress SARS-CoV-2/FLU/RSV plus assay is intended as an aid in the diagnosis of influenza from Nasopharyngeal swab specimens and should not be used as a sole basis for treatment. Nasal washings and aspirates are unacceptable for Xpert Xpress SARS-CoV-2/FLU/RSV testing.  Fact Sheet for Patients: BloggerCourse.com  Fact Sheet for Healthcare Providers: SeriousBroker.it  This test is not yet approved or cleared by the [place] FDA and has been authorized for detection and/or diagnosis of SARS-CoV-2 by FDA under an Emergency Use Authorization (EUA). This EUA will remain in effect (meaning this test can be used) for the duration of the COVID-19 declaration under Section 564(b)(1) of the Act, 21 U.S.C. section 360bbb-3(b)(1), unless the authorization is terminated or revoked.     Resp Syncytial Virus by PCR NEGATIVE NEGATIVE Final    Comment: (NOTE) Fact Sheet for Patients: BloggerCourse.com  Fact Sheet for Healthcare Providers: SeriousBroker.it  This test is not yet approved or  cleared by the [place] FDA and has been authorized for detection and/or diagnosis of SARS-CoV-2 by FDA under an Emergency Use Authorization (EUA). This EUA will remain in effect (meaning this test can be used) for the duration of the COVID-19 declaration under Section 564(b)(1) of the Act, 21 U.S.C. section 360bbb-3(b)(1), unless the authorization is terminated or revoked.  Performed at [facility], [address] [place], [place] [address]   C Difficile Quick Screen w PCR reflex     Status: None   Collection Time: 05/05/23  7:35 PM   Specimen: STOOL  Result Value Ref Range Status   C Diff antigen NEGATIVE NEGATIVE Final   C Diff toxin NEGATIVE NEGATIVE Final   C Diff interpretation No C. difficile detected.  Final    Comment: Performed at [facility], [address] [place], [place] [address]    Coagulation Studies: No results for input(s): "LABPROT", "INR" in the last 72 hours.   Urinalysis: No results for input(s): "COLORURINE", "LABSPEC", "PHURINE", "GLUCOSEU", "HGBUR", "BILIRUBINUR", "KETONESUR", "PROTEINUR", "UROBILINOGEN", "NITRITE", "LEUKOCYTESUR" in the last 72 hours.  Invalid input(s): "APPERANCEUR"    Imaging: No results found.    Medications:      apixaban  2.5 mg Oral BID   ascorbic acid  250 mg Oral Daily   calcium acetate  1,334 mg Oral TID WC   Chlorhexidine Gluconate Cloth  6 each Topical Daily   clotrimazole-betamethasone   Topical BID   dexamethasone  6 mg Oral Daily   dorzolamide-timolol  1 drop Both Eyes Daily   epoetin alfa-epbx (RETACRIT) injection  4,000 Units Intravenous Q M,W,F-HD   feeding supplement (NEPRO CARB STEADY)  237 mL Oral BID BM   fluticasone furoate-vilanterol  1 puff Inhalation Daily   HYDROmorphone  2 mg Oral Once   insulin aspart  0-5 Units Subcutaneous QHS   insulin aspart  0-9 Units Subcutaneous TID WC   insulin aspart  4 Units Subcutaneous TID WC   insulin glargine-yfgn  5 Units  Subcutaneous Daily   Ipratropium-Albuterol  1 puff Inhalation Q6H   multivitamin  1 tablet Oral QHS   neomycin-polymyxin b-dexamethasone  1 Application Right Eye TID   pantoprazole (PROTONIX) IV  40 mg Intravenous Q12H   QUEtiapine  25 mg Oral BID   rosuvastatin  10 mg Oral Once per day on Monday Wednesday Friday   traZODone  50 mg Oral QHS   acetaminophen, albuterol, dextromethorphan-guaiFENesin, hydrALAZINE, HYDROmorphone **OR** HYDROmorphone (DILAUDID) injection, loperamide, nitroGLYCERIN, ondansetron (ZOFRAN) IV  Assessment/ Plan:  Kyle Roberson is a 83 y.o.  male with diabetes mellitus type II, hypertension, coronary artery disease, chronic diastolic congestive heart failure, atrial fibrillation, pulmonary hypertension, and historyof subdural hematoma who is admitted for Weakness [R53.1] Fall, initial encounter [W19.XXXA] COVID-19 virus infection [U07.1] COVID-19 [U07.1] Uremia of renal origin [N19]  [facility]/MWF/right chest PermCath  End-stage renal disease on hemodialysis.  Patient scheduled to receive dialysis later today, UF goal 1.5 to 2 L as tolerated.  Next treatment scheduled for Friday.  Monitoring discharge plans to assess outpatient needs.  COVID-19, found positive this admission.  Continue supportive care. Room air  3. Anemia of chronic kidney disease  Lab Results  Component Value Date   HGB 8.7 (L) 05/12/2023    Hemoglobin 8.7.  Continue low dose EPO with dialysis.  Patient receives Mircera at outpatient clinic.   4. Secondary Hyperparathyroidism: with outpatient labs: None available  Lab Results  Component Value Date   PTH 344 (H) 01/07/2023   CALCIUM 7.7 (L) 05/12/2023   CAION 1.14 (L) 12/02/2022   PHOS 6.1 (H) 05/11/2023    Will continue to monitor bone minerals during this admission.  Hyperphosphatemia has improved since admission.  Continue calcium acetate with meals.   LOS: 5 Camilla Skeen 2/19/20251:27 PM

## 2023-05-12 NOTE — Progress Notes (Signed)
 Mobility Specialist - Progress Note     05/12/23 1430  Mobility  Activity Turned to right side;Turned to left side  Range of Motion/Exercises Active  Activity Response Tolerated well  Mobility Referral Yes  Mobility visit 1 Mobility  Mobility Specialist Start Time (ACUTE ONLY) 1422  Mobility Specialist Stop Time (ACUTE ONLY) 1432  Mobility Specialist Time Calculation (min) (ACUTE ONLY) 10 min   Pt resting in bed on RA upon entry. Pt Initially agreeable to hoyer to recliner but session ended due to dialysis in room due to COVID-19. Pt more accessible in bed. Mobility will return tomorrow for second attempt.   Kyle Roberson Mobility Specialist 05/12/23, 2:35 PM

## 2023-05-12 NOTE — Progress Notes (Signed)
 [facility] Progress Note  Patient: Kyle Roberson    ZOX:[id number]  DOA: 05/05/2023     Date of Service: the patient was seen and examined on 05/12/2023  Chief Complaint  Patient presents with   Fall   Brief hospital course:  Kyle Roberson is a 83 y.o. male with medical history significant of ESRD-HD (MWF), HTN, HLD, DM, CAD with stent, dCHF, depression with anxiety, BPH, anemia, A-fib on Eliquis, left eye blindness, who presents with cough, shortness of breath, diarrhea, fall.   Per pt and his wife (I called his wife by phone), patient has productive cough, sore throat, SOB, malaise for more than 5 days.  No chest pain.  No fever or chills.  Patient has diarrhea for more than 10 days, with 2- 3 times of watery diarrhea each day.  No nausea, vomiting or abdominal pain.  Patient fell this morning, no loss of consciousness. He patient states that he was going to the bathroom when he suddenly felt weak in his gave out and fell.  No unilateral numbness or tingling in extremities.  No facial droop or slurred speech.  Patient states that he has missed several dialysis. His last dialysis was on Monday.     Data reviewed independently and ED Course: pt was found to have positive COVID 19, WBC 5.6, troponin 95 --> 105, potassium 4.7, bicarbonate 18, creatinine 10.02, BUN 102.  CT head negative. Chest x-ray showed cardiomegaly with vascular congestion.  Patient is placed on telemetry bed for observation.     EKG: I have personally reviewed.  A-fib, QTc 461, low voltage, poor IV progression, anteroseptal infarction pattern.   Assessment and Plan:  # COVID-19 virus infection: No fever, no leukocytosis, no oxygen desaturation.   Chest x-ray showed vascular congestion, no infiltration. -Bronchodilators and as needed Mucinex -Supportive care 2/15 still sob after HD, may benefit from steroids and breathing treatments S/p Prednisone 40 mg p.o. daily x 3 days, Breo Ellipta inhaler Continue  albuterol as needed 2/16 patient is refusing inhalers, started on nebulizer treatments if he agrees. 2/17 started Decadron 6 mg p.o. daily for 7 days  # CAD (coronary artery disease) and Myocardial injury:  Troponin 894, but No chest pain and no significant EKG changes Continue to monitor on telemetry S/p ASA 81 mg and heparin IV infusion, discontinued on 2/14, due to GI bleed 2/14 continue to hold Eliquis for now Cardiology consulted, recommended no ischemic workup at this time, follow as an outpatient for Myoview when recovered after COVID infection TTE shows LVEF 55 to 60%, grade 2 diastolic dysfunction, no wall motion abnormality.  Severe pulmonary hypertension, RA moderately dilated.  Moderate TR As per cardio patient may need right and left heart cath due to pulmonary hypertension and elevated troponin.  # Paroxysmal atrial fibrillation: -Tele monitoring - s/p heparin IV infusion, Hb dropped, suspected GI bleed.  Patient was cleared by GI to continue heparin IV infusion.  2/15 resumed Eliquis 2.5 mg p.o. 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 nightly and Seroquel 25 mg p.o. twice daily   # Fall at home, initial encounter: CT-head negative -Fall precaution, CK level wnl -PT/OT eval pending  # Type II diabetes mellitus with renal manifestations:  Recent A1c 7.3, poorly controlled for patient taking glipizide at home -SSI 2/18 started Semglee 5  units subcu daily and NovoLog 4 units 3 times daily due to hyperglycemia secondary to steroids. Follow diabetic coordinator and titrate insulin dose accordingly. Monitor CBG and continue diabetic diet   # Essential hypertension: Patient's not taking medications.   Nonsudden V. tach episode 7 beats run at 630 on 2/13 reported 2/13 started metoprolol 12.5 mg p.o. 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 Difficile negative Now patient developed constipation.  # Constipation, started laxatives on 2/19 Use suppository and enema as needed  Body mass index is 31.09 kg/m.  Interventions:  Diet: Renal diet DVT Prophylaxis: Heparin IV infusion  Advance goals of care discussion: Full code  Family Communication: family was not present at bedside, at the time of interview.  The pt provided permission to discuss medical plan with the family. Opportunity was given to ask question and all questions were answered satisfactorily.   Disposition:  Pt is from home, admitted with COVID viral infection, missed hemodialysis, elevated troponin s/p IV heparin infusion, still bedbound, which precludes a safe discharge. Discharge to SNF vs Home with HH, TBD after PT and OT eval, patient does not participate with PT due to behavior issue. F/u PT and OT eval Follow TOC for placement   Subjective: No significant events overnight.  Patient still has generalized weakness and lethargy, unable to get out of the bed.  Still has mild shortness of breath and lower extremity tenderness.  Denies any specific complaints.  Patient  was advised to participate with PT and get out of the bed if he wants to go home with home health services otherwise he could think about SNF placement. Patient stated that he does not want to go to SNF, and he became very aggressive    Physical Exam: General: Mild respiratory distress, NAD affect depressed and aggressive Eyes: PERRLA ENT: Oral Mucosa Clear, moist  Neck: no JVD,  Cardiovascular: S1 and S2 Present, no Murmur,  Respiratory: Equal air entry bilaterally, mild bibasilar crackles, No wheezes  Abdomen: Bowel Sound present, Soft and no tenderness,  Skin: no rashes Extremities: Chronic lymphedema and chronic skin changes, dry scaly skin, improving Neurologic: without any new focal findings Gait not checked due to patient safety concerns  Vitals:   05/12/23 0749 05/12/23 1503 05/12/23 1545 05/12/23 1615  BP: (!) 117/52 (!) 108/35 (!) 108/53 (!) 111/52  Pulse: (!) 51 (!) 51 (!) 52 (!) 50  Resp: 18 18 19 18   Temp: (!) 97.4 F (36.3 C) (!) 97.2 F (36.2 C) (!) 97.3 F (36.3 C)   TempSrc: Oral Oral    SpO2: 95% 96% 95% 95%  Weight:   95.5 kg   Height:        Intake/Output Summary (Last 24 hours) at 05/12/2023 1626 Last data filed at 05/12/2023 1505 Gross per 24 hour  Intake  240 ml  Output --  Net 240 ml   Filed Weights   05/11/23 0500 05/12/23 0500 05/12/23 1545  Weight: 95.5 kg 95.5 kg 95.5 kg    Data Reviewed: I have personally reviewed and interpreted daily labs, tele strips, imagings as discussed above. I reviewed all nursing notes, pharmacy notes, vitals, pertinent old records I have discussed plan of care as described above with RN and patient/family.  CBC: Recent Labs  Lab 05/08/23 0035 05/08/23 1638 05/09/23 0536 05/10/23 1220 05/11/23 0800 05/12/23 0809  WBC 7.5  --  5.0 7.6 6.8 7.5  HGB 8.8* 9.0* 8.5* 8.4* 8.3* 8.7*  HCT 25.5* 27.0* 25.4* 25.0* 25.0* 26.3*  MCV 92.7  --  95.1 94.3 95.4 95.6  PLT 154  --  155 169 172 170   Basic Metabolic  Panel: Recent Labs  Lab 05/07/23 0749 05/08/23 0035 05/09/23 0536 05/10/23 1220 05/11/23 0800 05/12/23 0809  NA 141 137 138 138 135 137  K 5.4* 3.7 4.4 4.4 4.3 5.0  CL 103 99 98 99 96* 99  CO2 15* 19* 19* 21* 22 21*  GLUCOSE 100* 188* 181* 179* 175* 234*  BUN 137* 78* 96* 119* 75* 98*  CREATININE 11.98* 7.23* 9.05* 10.78* 7.12* 8.58*  CALCIUM 7.0* 7.5* 7.7* 7.6* 7.6* 7.7*  MG 1.9 1.7 1.9 2.0 1.8  --   PHOS >30.0* 6.3* 8.5* 8.5* 6.1*  --     Studies: No results found.   Scheduled Meds:  apixaban  2.5 mg Oral BID   ascorbic acid  250 mg Oral Daily   bisacodyl  10 mg Oral Once   bisacodyl  10 mg Rectal QHS   Or   bisacodyl  10 mg Oral QHS   calcium acetate  1,334 mg Oral TID WC   Chlorhexidine Gluconate Cloth  6 each Topical Daily   clotrimazole-betamethasone   Topical BID   dexamethasone  6 mg Oral Daily   dorzolamide-timolol  1 drop Both Eyes Daily   epoetin alfa-epbx (RETACRIT) injection  4,000 Units Intravenous Q M,W,F-HD   feeding supplement (NEPRO CARB STEADY)  237 mL Oral BID BM   fluticasone furoate-vilanterol  1 puff Inhalation Daily   HYDROmorphone  2 mg Oral Once   insulin aspart  0-5 Units Subcutaneous QHS   insulin aspart  0-9 Units Subcutaneous TID WC   insulin aspart  4 Units Subcutaneous TID WC   insulin glargine-yfgn  5 Units Subcutaneous Daily   Ipratropium-Albuterol  1 puff Inhalation Q6H   [START ON 05/13/2023] mineral oil  1 enema Rectal Once   multivitamin  1 tablet Oral QHS   neomycin-polymyxin b-dexamethasone  1 Application Right Eye TID   pantoprazole (PROTONIX) IV  40 mg Intravenous Q12H   polyethylene glycol  17 g Oral BID   QUEtiapine  25 mg Oral BID   rosuvastatin  10 mg Oral Once per day on Monday Wednesday Friday   traZODone  50 mg Oral QHS   Continuous Infusions:   PRN Meds: acetaminophen, albuterol, [START ON 05/13/2023] bisacodyl, dextromethorphan-guaiFENesin, hydrALAZINE, HYDROmorphone **OR** HYDROmorphone (DILAUDID) injection,  loperamide, nitroGLYCERIN, ondansetron (ZOFRAN) IV  Time spent: 40 minutes  Author: Gillis Santa. MD [facility] 05/12/2023 4:26 PM  To reach On-call, see care teams to locate the attending and reach out to them via [email or web address]. If 7PM-7AM, please contact night-coverage If you still have difficulty reaching the attending provider, please page the [facility] (Director on Call) for [facility] on amion for assistance.

## 2023-05-13 ENCOUNTER — Ambulatory Visit (HOSPITAL_COMMUNITY): Payer: Medicare Other

## 2023-05-13 DIAGNOSIS — U071 COVID-19: Secondary | ICD-10-CM | POA: Diagnosis not present

## 2023-05-13 LAB — CBC
HCT: 29 % — ABNORMAL LOW (ref 39.0–52.0)
Hemoglobin: 9.8 g/dL — ABNORMAL LOW (ref 13.0–17.0)
MCH: 32.2 pg (ref 26.0–34.0)
MCHC: 33.8 g/dL (ref 30.0–36.0)
MCV: 95.4 fL (ref 80.0–100.0)
Platelets: 198 10*3/uL (ref 150–400)
RBC: 3.04 MIL/uL — ABNORMAL LOW (ref 4.22–5.81)
RDW: 13.4 % (ref 11.5–15.5)
WBC: 9.8 10*3/uL (ref 4.0–10.5)
nRBC: 0 % (ref 0.0–0.2)

## 2023-05-13 LAB — BASIC METABOLIC PANEL
Anion gap: 13 (ref 5–15)
BUN: 60 mg/dL — ABNORMAL HIGH (ref 8–23)
CO2: 24 mmol/L (ref 22–32)
Calcium: 7.9 mg/dL — ABNORMAL LOW (ref 8.9–10.3)
Chloride: 99 mmol/L (ref 98–111)
Creatinine, Ser: 5.69 mg/dL — ABNORMAL HIGH (ref 0.61–1.24)
GFR, Estimated: 9 mL/min — ABNORMAL LOW (ref >60)
Glucose, Bld: 151 mg/dL — ABNORMAL HIGH (ref 70–99)
Potassium: 4.1 mmol/L (ref 3.5–5.1)
Sodium: 136 mmol/L (ref 135–145)

## 2023-05-13 LAB — GLUCOSE, CAPILLARY
Glucose-Capillary: 126 mg/dL — ABNORMAL HIGH (ref 70–99)
Glucose-Capillary: 78 mg/dL (ref 70–99)
Glucose-Capillary: 231 mg/dL — ABNORMAL HIGH (ref 70–99)
Glucose-Capillary: 284 mg/dL — ABNORMAL HIGH (ref 70–99)
Glucose-Capillary: 291 mg/dL — ABNORMAL HIGH (ref 70–99)

## 2023-05-13 MED ORDER — GUAIFENESIN ER 600 MG PO TB12
600.0000 mg | ORAL_TABLET | Freq: Two times a day (BID) | ORAL | Status: DC
  Administered 2023-05-13 – 2023-05-25 (×20): 600 mg via ORAL
  Filled 2023-05-13 (×23): qty 1

## 2023-05-13 MED ORDER — PANTOPRAZOLE SODIUM 40 MG PO TBEC
40.0000 mg | DELAYED_RELEASE_TABLET | Freq: Two times a day (BID) | ORAL | Status: DC
  Administered 2023-05-14 – 2023-05-25 (×19): 40 mg via ORAL
  Filled 2023-05-13 (×22): qty 1

## 2023-05-13 NOTE — Progress Notes (Signed)
 [facility]  ROUNDING NOTE   Subjective:   Kyle Roberson is a 83 y.o. male with diabetes mellitus type II, hypertension, coronary artery disease, chronic diastolic congestive heart failure, atrial fibrillation, pulmonary hypertension, and historyof subdural hematoma who is admitted for Weakness [R53.1] Fall, initial encounter [W19.XXXA] COVID-19 virus infection [U07.1] COVID-19 [U07.1] Uremia of renal origin [N19]  Patient is known to our practice and receives outpatient dialysis at [facility] on a MWF, supervised by Dr Cherylann Ratel. He received his last treatment on Wednesday.   Patient seen laying in bed, staff at bedside performing patient care Patient currently alert and extremely agitated Cursing at staff Room air without shortness of breath  Objective:  Vital signs in last 24 hours:  Temp:  [97.2 F (36.2 C)-99 F (37.2 C)] 99 F (37.2 C) (02/20 0830) Pulse Rate:  [50-71] 55 (02/20 0830) Resp:  [17-20] 20 (02/20 0830) BP: (102-155)/(35-68) 155/64 (02/20 0830) SpO2:  [94 %-100 %] 100 % (02/20 0830) Weight:  [95.5 kg] 95.5 kg (02/19 1545)  Weight change: 0 kg Filed Weights   05/11/23 0500 05/12/23 0500 05/12/23 1545  Weight: 95.5 kg 95.5 kg 95.5 kg    Intake/Output: I/O last 3 completed shifts: In: 690 [P.O.:690] Out: 2500 [Other:2500]   Intake/Output this shift:  No intake/output data recorded.  Physical Exam: General: NAD  Head: Normocephalic, atraumatic. Moist oral mucosal membranes  Eyes: Anicteric  Lungs:  Clear to auscultation, room air  Heart: Regular rate and rhythm  Abdomen:  Soft, nontender  Extremities: 1+ peripheral edema.  Neurologic: Alert and oriented, moving all four extremities  Skin: Scaly bilateral lower extremities  Access: Right chest tunneled access    Basic Metabolic Panel: Recent Labs  Lab 05/07/23 0749 05/08/23 0035 05/09/23 0536 05/10/23 1220 05/11/23 0800 05/12/23 0809 05/13/23 0610  NA 141 137 138 138  135 137 136  K 5.4* 3.7 4.4 4.4 4.3 5.0 4.1  CL 103 99 98 99 96* 99 99  CO2 15* 19* 19* 21* 22 21* 24  GLUCOSE 100* 188* 181* 179* 175* 234* 151*  BUN 137* 78* 96* 119* 75* 98* 60*  CREATININE 11.98* 7.23* 9.05* 10.78* 7.12* 8.58* 5.69*  CALCIUM 7.0* 7.5* 7.7* 7.6* 7.6* 7.7* 7.9*  MG 1.9 1.7 1.9 2.0 1.8  --   --   PHOS >30.0* 6.3* 8.5* 8.5* 6.1*  --   --     Liver Function Tests: No results for input(s): "AST", "ALT", "ALKPHOS", "BILITOT", "PROT", "ALBUMIN" in the last 168 hours.  No results for input(s): "LIPASE", "AMYLASE" in the last 168 hours.  No results for input(s): "AMMONIA" in the last 168 hours.  CBC: Recent Labs  Lab 05/09/23 0536 05/10/23 1220 05/11/23 0800 05/12/23 0809 05/13/23 0610  WBC 5.0 7.6 6.8 7.5 9.8  HGB 8.5* 8.4* 8.3* 8.7* 9.8*  HCT 25.4* 25.0* 25.0* 26.3* 29.0*  MCV 95.1 94.3 95.4 95.6 95.4  PLT 155 169 172 170 198    Cardiac Enzymes: Recent Labs  Lab 05/08/23 0927 05/09/23 0536  CKTOTAL 94 75    BNP: Invalid input(s): "POCBNP"  CBG: Recent Labs  Lab 05/12/23 0834 05/12/23 1145 05/12/23 2127 05/13/23 0741 05/13/23 1250  GLUCAP 228* 287* 189* 126* 78    Microbiology: Results for orders placed or performed during the hospital encounter of 05/05/23  Resp panel by RT-PCR (RSV, Flu A&B, Covid) Anterior Nasal Swab     Status: Abnormal   Collection Time: 05/05/23  4:33 PM   Specimen: Anterior Nasal Swab  Result Value Ref Range Status   SARS Coronavirus 2 by RT PCR POSITIVE (A) NEGATIVE Final    Comment: (NOTE) SARS-CoV-2 target nucleic acids are DETECTED.  The SARS-CoV-2 RNA is generally detectable in upper respiratory specimens during the acute phase of infection. Positive results are indicative of the presence of the identified virus, but do not rule out bacterial infection or co-infection with other pathogens not detected by the test. Clinical correlation with patient history and other diagnostic information is necessary to  determine patient infection status. The expected result is Negative.  Fact Sheet for Patients: BloggerCourse.com  Fact Sheet for Healthcare Providers: SeriousBroker.it  This test is not yet approved or cleared by the [place] FDA and  has been authorized for detection and/or diagnosis of SARS-CoV-2 by FDA under an Emergency Use Authorization (EUA).  This EUA will remain in effect (meaning this test can be used) for the duration of  the COVID-19 declaration under Section 564(b)(1) of the A ct, 21 U.S.C. section 360bbb-3(b)(1), unless the authorization is terminated or revoked sooner.     Influenza A by PCR NEGATIVE NEGATIVE Final   Influenza B by PCR NEGATIVE NEGATIVE Final    Comment: (NOTE) The Xpert Xpress SARS-CoV-2/FLU/RSV plus assay is intended as an aid in the diagnosis of influenza from Nasopharyngeal swab specimens and should not be used as a sole basis for treatment. Nasal washings and aspirates are unacceptable for Xpert Xpress SARS-CoV-2/FLU/RSV testing.  Fact Sheet for Patients: BloggerCourse.com  Fact Sheet for Healthcare Providers: SeriousBroker.it  This test is not yet approved or cleared by the [place] FDA and has been authorized for detection and/or diagnosis of SARS-CoV-2 by FDA under an Emergency Use Authorization (EUA). This EUA will remain in effect (meaning this test can be used) for the duration of the COVID-19 declaration under Section 564(b)(1) of the Act, 21 U.S.C. section 360bbb-3(b)(1), unless the authorization is terminated or revoked.     Resp Syncytial Virus by PCR NEGATIVE NEGATIVE Final    Comment: (NOTE) Fact Sheet for Patients: BloggerCourse.com  Fact Sheet for Healthcare Providers: SeriousBroker.it  This test is not yet approved or cleared by the [place] FDA and has  been authorized for detection and/or diagnosis of SARS-CoV-2 by FDA under an Emergency Use Authorization (EUA). This EUA will remain in effect (meaning this test can be used) for the duration of the COVID-19 declaration under Section 564(b)(1) of the Act, 21 U.S.C. section 360bbb-3(b)(1), unless the authorization is terminated or revoked.  Performed at [facility], [address] [place], [place] [address]   C Difficile Quick Screen w PCR reflex     Status: None   Collection Time: 05/05/23  7:35 PM   Specimen: STOOL  Result Value Ref Range Status   C Diff antigen NEGATIVE NEGATIVE Final   C Diff toxin NEGATIVE NEGATIVE Final   C Diff interpretation No C. difficile detected.  Final    Comment: Performed at [facility], [address] [place], [place] [address]    Coagulation Studies: No results for input(s): "LABPROT", "INR" in the last 72 hours.   Urinalysis: No results for input(s): "COLORURINE", "LABSPEC", "PHURINE", "GLUCOSEU", "HGBUR", "BILIRUBINUR", "KETONESUR", "PROTEINUR", "UROBILINOGEN", "NITRITE", "LEUKOCYTESUR" in the last 72 hours.  Invalid input(s): "APPERANCEUR"    Imaging: No results found.    Medications:      apixaban  2.5 mg Oral BID   ascorbic acid  250 mg Oral Daily   bisacodyl  10 mg Oral Once  calcium acetate  1,334 mg Oral TID WC   Chlorhexidine Gluconate Cloth  6 each Topical Daily   clotrimazole-betamethasone   Topical BID   dexamethasone  6 mg Oral Daily   dorzolamide-timolol  1 drop Both Eyes Daily   epoetin alfa-epbx (RETACRIT) injection  4,000 Units Intravenous Q M,W,F-HD   feeding supplement (NEPRO CARB STEADY)  237 mL Oral BID BM   fluticasone furoate-vilanterol  1 puff Inhalation Daily   HYDROmorphone  2 mg Oral Once   insulin aspart  0-5 Units Subcutaneous QHS   insulin aspart  0-9 Units Subcutaneous TID WC   insulin aspart  4 Units Subcutaneous TID WC   insulin glargine-yfgn  5 Units Subcutaneous Daily    Ipratropium-Albuterol  1 puff Inhalation Q6H   mineral oil  1 enema Rectal Once   multivitamin  1 tablet Oral QHS   neomycin-polymyxin b-dexamethasone  1 Application Right Eye TID   pantoprazole (PROTONIX) IV  40 mg Intravenous Q12H   polyethylene glycol  17 g Oral BID   QUEtiapine  25 mg Oral BID   rosuvastatin  10 mg Oral Once per day on Monday Wednesday Friday   traZODone  50 mg Oral QHS   acetaminophen, albuterol, bisacodyl, dextromethorphan-guaiFENesin, hydrALAZINE, HYDROmorphone **OR** HYDROmorphone (DILAUDID) injection, loperamide, nitroGLYCERIN, ondansetron (ZOFRAN) IV  Assessment/ Plan:  [name] is a 83 y.o.  male with diabetes mellitus type II, hypertension, coronary artery disease, chronic diastolic congestive heart failure, atrial fibrillation, pulmonary hypertension, and historyof subdural hematoma who is admitted for Weakness [R53.1] Fall, initial encounter [W19.XXXA] COVID-19 virus infection [U07.1] COVID-19 [U07.1] Uremia of renal origin [N19]  [facility]/MWF/right chest PermCath  End-stage renal disease on hemodialysis.  Dialysis received yesterday, UF 2.5 L achieved.  Next treatment scheduled for Friday.    COVID-19, found positive this admission.  Continue supportive care. Room air  3. Anemia of chronic kidney disease  Lab Results  Component Value Date   HGB 9.8 (L) 05/13/2023    Hemoglobin 9.8.  Continue low dose EPO with dialysis.  Patient receives Mircera at outpatient clinic.   4. Secondary Hyperparathyroidism: with outpatient labs: None available  Lab Results  Component Value Date   PTH 344 (H) 01/07/2023   CALCIUM 7.9 (L) 05/13/2023   CAION 1.14 (L) 12/02/2022   PHOS 6.1 (H) 05/11/2023    Hyperphosphatemia has improved since admission.  Continue calcium acetate with meals.   LOS: 6 Kyle Roberson 2/20/20251:09 PM

## 2023-05-13 NOTE — Progress Notes (Signed)
 PHARMACIST - PHYSICIAN COMMUNICATION CONCERNING: IV to Oral Route Change Policy  RECOMMENDATION: This patient is receiving pantoprazole intravenously. Based on criteria approved by the Pharmacy and Therapeutics Committee, the intravenous medication(s) is/are being converted to the equivalent dose of an oral formulation.   DESCRIPTION: These criteria include: The patient is eating (either orally or via tube) and/or has been taking other orally administered medications for at least 24 hours. The patient has no evidence of active gastrointestinal bleeding or malabsorptive GI state (gastrectomy; short bowel; patient on TNA or NPO).  If you have questions about this conversion, please contact the Pharmacy Department:  [x]   [phone] )  [facility] []   [phone] )  [facility] []   [phone] )  [facility] []   [phone] )  [facility]   Will M. Dareen Piano, PharmD Clinical Pharmacist 05/13/2023 3:25 PM

## 2023-05-13 NOTE — Plan of Care (Signed)

## 2023-05-13 NOTE — TOC Progression Note (Signed)
 Transition of Care [facility]) - Progression Note    Patient Details  Name: Kyle Roberson MRN: [id number] Date of Birth: 03/19/1941  [facility]) CM/SW Contact  Marlowe Sax, RN Phone Number: 05/13/2023, 9:11 AM  Clinical Narrative:     Patient followed by [facility] he refuses to go to SNF and refuses [facility]  Expected Discharge Plan: Home/Self Care Barriers to Discharge: Continued Medical Work up  Expected Discharge Plan and Services   Discharge Planning Services: CM Consult   Living arrangements for the past 2 months: Single Family Home                 DME Arranged: N/A DME Agency: NA       HH Arranged: Refused SNF, Patient Refused HH HH Agency: NA         Social Determinants of Health (SDOH) Interventions SDOH Screenings   Food Insecurity: No Food Insecurity (05/07/2023)  Housing: Low Risk  (05/08/2023)  Transportation Needs: No Transportation Needs (05/08/2023)  Utilities: Not At Risk (05/08/2023)  Depression (PHQ2-9): Low Risk  (07/27/2022)  Tobacco Use: Medium Risk (05/05/2023)    Readmission Risk Interventions    05/12/2023   10:07 AM  Readmission Risk Prevention Plan  Transportation Screening Complete  Medication Review (RN Care Manager) Referral to Pharmacy  PCP or Specialist appointment within 3-5 days of discharge Complete  HRI or Home Care Consult Complete  Palliative Care Screening Not Applicable  Skilled Nursing Facility Not Applicable

## 2023-05-13 NOTE — Progress Notes (Signed)
 [facility] Progress Note  Patient: Kyle Roberson    VHQ:[id number]  DOA: 05/05/2023     Date of Service: the patient was seen and examined on 05/13/2023  Chief Complaint  Patient presents with   Fall   Brief hospital course:  Kyle Roberson is a 83 y.o. male with medical history significant of ESRD-HD (MWF), HTN, HLD, DM, CAD with stent, dCHF, depression with anxiety, BPH, anemia, A-fib on Eliquis, left eye blindness, who presents with cough, shortness of breath, diarrhea, fall.   Per pt and his wife (I called his wife by phone), patient has productive cough, sore throat, SOB, malaise for more than 5 days.  No chest pain.  No fever or chills.  Patient has diarrhea for more than 10 days, with 2- 3 times of watery diarrhea each day.  No nausea, vomiting or abdominal pain.  Patient fell this morning, no loss of consciousness. He patient states that he was going to the bathroom when he suddenly felt weak in his gave out and fell.  No unilateral numbness or tingling in extremities.  No facial droop or slurred speech.  Patient states that he has missed several dialysis. His last dialysis was on Monday.     Data reviewed independently and ED Course: pt was found to have positive COVID 19, WBC 5.6, troponin 95 --> 105, potassium 4.7, bicarbonate 18, creatinine 10.02, BUN 102.  CT head negative. Chest x-ray showed cardiomegaly with vascular congestion.  Patient is placed on telemetry bed for observation.     EKG: I have personally reviewed.  A-fib, QTc 461, low voltage, poor IV progression, anteroseptal infarction pattern.   Assessment and Plan:  # COVID-19 virus infection: No fever, no leukocytosis, no oxygen desaturation.   Chest x-ray showed vascular congestion, no infiltration. -Bronchodilators, Mucinex 600 mg p.o. twice daily, Robitussin DM as needed for cough -Continue supportive care 2/15 still sob after HD, may benefit from steroids and breathing treatments S/p Prednisone 40  mg p.o. daily x 3 days, Breo Ellipta inhaler Continue albuterol as needed 2/16 patient is refusing inhalers, started on nebulizer treatments if he agrees. 2/17 started Decadron 6 mg p.o. daily for 7 days   # CAD (coronary artery disease) and Myocardial injury:  Troponin 894, but No chest pain and no significant EKG changes Continue to monitor on telemetry S/p ASA 81 mg and heparin IV infusion, discontinued on 2/14, due to GI bleed 2/14 continue to hold Eliquis for now Cardiology consulted, recommended no ischemic workup at this time, follow as an outpatient for Myoview when recovered after COVID infection TTE shows LVEF 55 to 60%, grade 2 diastolic dysfunction, no wall motion abnormality.  Severe pulmonary hypertension, RA moderately dilated.  Moderate TR As per cardio patient may need right and left heart cath due to pulmonary hypertension and elevated troponin.  # Paroxysmal atrial fibrillation: -Tele monitoring - s/p heparin IV infusion, Hb dropped, suspected GI bleed.  Patient was cleared by GI to continue heparin IV infusion.  2/15 resumed Eliquis 2.5 mg p.o. 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 nightly and Seroquel 25 mg p.o. twice daily 2/20 psych consulted for further recommendation  # Fall at home, initial encounter: CT-head negative -Fall precaution, CK level wnl -PT/OT eval pending  # Type II diabetes mellitus with renal manifestations:   Recent A1c 7.3, poorly controlled for patient taking glipizide at home -SSI 2/18 started Semglee 5 units subcu daily and NovoLog 4 units 3 times daily due to hyperglycemia secondary to steroids. Follow diabetic coordinator and titrate insulin dose accordingly. Monitor CBG and continue diabetic diet   # Essential hypertension: Patient's not taking medications.   Nonsudden V. tach episode 7 beats run at 630 on 2/13 reported 2/13 started metoprolol 12.5 mg p.o. 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 Difficile negative Now patient developed constipation.  # Constipation, started laxatives on 2/19 Use suppository and enema as needed  Body mass index is 31.09 kg/m.  Interventions:  Diet: Renal diet DVT Prophylaxis: Heparin IV infusion  Advance goals of care discussion: Full code  Family Communication: family was not present at bedside, at the time of interview.  The pt provided permission to discuss medical plan with the family. Opportunity was given to ask question and all questions were answered satisfactorily. 2/20 discussed with patient's wife over the phone.  She will get in touch with TOC for disposition plan   Disposition:  Pt is from home, admitted with COVID viral infection, missed hemodialysis, elevated troponin s/p IV heparin infusion, still bedbound, which precludes a safe discharge. Discharge to SNF vs Home with HH, TBD after PT and OT eval, patient does not participate with PT due to behavior issue. F/u PT and OT eval Follow TOC for placement   Subjective: No significant events  overnight.  Patient resting currently, still has mild cough and phlegm production.  Lower extremity tenderness is improving, patient still not motivated to get up and work with PT, encouraged to get out of the bed. Patient denied any specific complaints.  Physical Exam: General: Mild respiratory distress, NAD affect depressed and aggressive Eyes: PERRLA ENT: Oral Mucosa Clear, moist  Neck: no JVD,  Cardiovascular: S1 and S2 Present, no Murmur,  Respiratory: Equal air entry bilaterally, mild bibasilar crackles, No wheezes  Abdomen: Bowel Sound present, Soft and no tenderness,  Skin: no rashes Extremities: Chronic lymphedema and chronic skin changes, dry scaly skin, improving Neurologic: without any new focal findings Gait not checked due to patient safety concerns  Vitals:   05/12/23 1845 05/12/23 1904 05/12/23 2125 05/13/23 0830  BP: 102/68 (!) 123/59 126/67 (!) 155/64  Pulse: 71 (!) 59 61 (!) 55  Resp: 18 19 17 20   Temp:  (!) 97.3 F (36.3 C) 97.7 F (36.5 C) 99 F (37.2 C)  TempSrc:   Oral Oral  SpO2: 100% 97% 96% 100%  Weight:      Height:        Intake/Output Summary (Last 24 hours) at 05/13/2023 1516 Last data filed at 05/12/2023 2311 Gross per 24 hour  Intake 450 ml  Output 2500 ml  Net -2050 ml   Ceasar Mons  Weights   05/11/23 0500 05/12/23 0500 05/12/23 1545  Weight: 95.5 kg 95.5 kg 95.5 kg    Data Reviewed: I have personally reviewed and interpreted daily labs, tele strips, imagings as discussed above. I reviewed all nursing notes, pharmacy notes, vitals, pertinent old records I have discussed plan of care as described above with RN and patient/family.  CBC: Recent Labs  Lab 05/09/23 0536 05/10/23 1220 05/11/23 0800 05/12/23 0809 05/13/23 0610  WBC 5.0 7.6 6.8 7.5 9.8  HGB 8.5* 8.4* 8.3* 8.7* 9.8*  HCT 25.4* 25.0* 25.0* 26.3* 29.0*  MCV 95.1 94.3 95.4 95.6 95.4  PLT 155 169 172 170 198   Basic Metabolic Panel: Recent Labs  Lab 05/07/23 0749  05/08/23 0035 05/09/23 0536 05/10/23 1220 05/11/23 0800 05/12/23 0809 05/13/23 0610  NA 141 137 138 138 135 137 136  K 5.4* 3.7 4.4 4.4 4.3 5.0 4.1  CL 103 99 98 99 96* 99 99  CO2 15* 19* 19* 21* 22 21* 24  GLUCOSE 100* 188* 181* 179* 175* 234* 151*  BUN 137* 78* 96* 119* 75* 98* 60*  CREATININE 11.98* 7.23* 9.05* 10.78* 7.12* 8.58* 5.69*  CALCIUM 7.0* 7.5* 7.7* 7.6* 7.6* 7.7* 7.9*  MG 1.9 1.7 1.9 2.0 1.8  --   --   PHOS >30.0* 6.3* 8.5* 8.5* 6.1*  --   --     Studies: No results found.   Scheduled Meds:  apixaban  2.5 mg Oral BID   ascorbic acid  250 mg Oral Daily   bisacodyl  10 mg Oral Once   calcium acetate  1,334 mg Oral TID WC   Chlorhexidine Gluconate Cloth  6 each Topical Daily   clotrimazole-betamethasone   Topical BID   dexamethasone  6 mg Oral Daily   dorzolamide-timolol  1 drop Both Eyes Daily   epoetin alfa-epbx (RETACRIT) injection  4,000 Units Intravenous Q M,W,F-HD   feeding supplement (NEPRO CARB STEADY)  237 mL Oral BID BM   fluticasone furoate-vilanterol  1 puff Inhalation Daily   HYDROmorphone  2 mg Oral Once   insulin aspart  0-5 Units Subcutaneous QHS   insulin aspart  0-9 Units Subcutaneous TID WC   insulin aspart  4 Units Subcutaneous TID WC   insulin glargine-yfgn  5 Units Subcutaneous Daily   Ipratropium-Albuterol  1 puff Inhalation Q6H   mineral oil  1 enema Rectal Once   multivitamin  1 tablet Oral QHS   neomycin-polymyxin b-dexamethasone  1 Application Right Eye TID   pantoprazole (PROTONIX) IV  40 mg Intravenous Q12H   polyethylene glycol  17 g Oral BID   QUEtiapine  25 mg Oral BID   rosuvastatin  10 mg Oral Once per day on Monday Wednesday Friday   traZODone  50 mg Oral QHS   Continuous Infusions:   PRN Meds: acetaminophen, albuterol, bisacodyl, dextromethorphan-guaiFENesin, hydrALAZINE, HYDROmorphone **OR** HYDROmorphone (DILAUDID) injection, loperamide, nitroGLYCERIN, ondansetron (ZOFRAN) IV  Time spent: 55  minutes  Author: Gillis Santa. MD [facility] 05/13/2023 3:16 PM  To reach On-call, see care teams to locate the attending and reach out to them via [email or web address]. If 7PM-7AM, please contact night-coverage If you still have difficulty reaching the attending provider, please page the [facility] (Director on Call) for [facility] on amion for assistance.

## 2023-05-14 DIAGNOSIS — R410 Disorientation, unspecified: Secondary | ICD-10-CM

## 2023-05-14 DIAGNOSIS — U071 COVID-19: Principal | ICD-10-CM

## 2023-05-14 LAB — GLUCOSE, CAPILLARY
Glucose-Capillary: 201 mg/dL — ABNORMAL HIGH (ref 70–99)
Glucose-Capillary: 221 mg/dL — ABNORMAL HIGH (ref 70–99)
Glucose-Capillary: 212 mg/dL — ABNORMAL HIGH (ref 70–99)

## 2023-05-14 MED ORDER — DEXAMETHASONE 0.5 MG PO TABS
2.0000 mg | ORAL_TABLET | Freq: Every day | ORAL | Status: AC
  Administered 2023-05-17 – 2023-05-18 (×2): 2 mg via ORAL
  Filled 2023-05-14 (×2): qty 4

## 2023-05-14 MED ORDER — BISACODYL 5 MG PO TBEC
10.0000 mg | DELAYED_RELEASE_TABLET | Freq: Every day | ORAL | Status: DC
  Administered 2023-05-16 – 2023-05-22 (×5): 10 mg via ORAL
  Filled 2023-05-14 (×9): qty 2

## 2023-05-14 MED ORDER — QUETIAPINE FUMARATE 25 MG PO TABS
50.0000 mg | ORAL_TABLET | Freq: Two times a day (BID) | ORAL | Status: DC
  Administered 2023-05-15 – 2023-05-25 (×17): 50 mg via ORAL
  Filled 2023-05-14 (×20): qty 2

## 2023-05-14 MED ORDER — HALOPERIDOL LACTATE 5 MG/ML IJ SOLN
2.0000 mg | Freq: Four times a day (QID) | INTRAMUSCULAR | Status: DC | PRN
  Administered 2023-05-14 (×2): 2 mg via INTRAVENOUS
  Filled 2023-05-14 (×2): qty 1

## 2023-05-14 MED ORDER — DEXAMETHASONE 4 MG PO TABS
4.0000 mg | ORAL_TABLET | Freq: Every day | ORAL | Status: AC
Start: 2023-05-15 — End: 2023-05-16
  Administered 2023-05-15 – 2023-05-16 (×2): 4 mg via ORAL
  Filled 2023-05-14 (×2): qty 1

## 2023-05-14 MED ORDER — BISACODYL 5 MG PO TBEC
10.0000 mg | DELAYED_RELEASE_TABLET | Freq: Once | ORAL | Status: AC
  Administered 2023-05-14: 10 mg via ORAL
  Filled 2023-05-14 (×2): qty 2

## 2023-05-14 NOTE — Progress Notes (Signed)
 [facility]  ROUNDING NOTE   Subjective:   Kyle Roberson is a 83 y.o. male with diabetes mellitus type II, hypertension, coronary artery disease, chronic diastolic congestive heart failure, atrial fibrillation, pulmonary hypertension, and historyof subdural hematoma who is admitted for Weakness [R53.1] Fall, initial encounter [W19.XXXA] COVID-19 virus infection [U07.1] COVID-19 [U07.1] Uremia of renal origin [N19]  Patient is known to our practice and receives outpatient dialysis at [facility] on a MWF, supervised by Dr Cherylann Ratel. He received his last treatment on Wednesday.   Patient seen sitting up in bed No family present Alert and oriented Ill-appearing today Room air Lower extremity edema at baseline  Objective:  Vital signs in last 24 hours:  Temp:  [97.7 F (36.5 C)-98.3 F (36.8 C)] 98.3 F (36.8 C) (02/21 0832) Pulse Rate:  [58-61] 58 (02/21 0025) Resp:  [16-20] 16 (02/21 0832) BP: (106-131)/(53-57) 106/53 (02/21 0832) SpO2:  [94 %-100 %] 100 % (02/21 0832)  Weight change:  Filed Weights   05/11/23 0500 05/12/23 0500 05/12/23 1545  Weight: 95.5 kg 95.5 kg 95.5 kg    Intake/Output: I/O last 3 completed shifts: In: 450 [P.O.:450] Out: 2500 [Other:2500]   Intake/Output this shift:  No intake/output data recorded.  Physical Exam: General: NAD  Head: Normocephalic, atraumatic. Moist oral mucosal membranes  Eyes: Anicteric  Lungs:  Clear to auscultation, room air  Heart: Regular rate and rhythm  Abdomen:  Soft, nontender  Extremities: 1+ peripheral edema.  Neurologic: Alert and oriented, moving all four extremities  Skin: Scaly bilateral lower extremities  Access: Right chest tunneled access    Basic Metabolic Panel: Recent Labs  Lab 05/08/23 0035 05/09/23 0536 05/10/23 1220 05/11/23 0800 05/12/23 0809 05/13/23 0610  NA 137 138 138 135 137 136  K 3.7 4.4 4.4 4.3 5.0 4.1  CL 99 98 99 96* 99 99  CO2 19* 19* 21* 22 21* 24   GLUCOSE 188* 181* 179* 175* 234* 151*  BUN 78* 96* 119* 75* 98* 60*  CREATININE 7.23* 9.05* 10.78* 7.12* 8.58* 5.69*  CALCIUM 7.5* 7.7* 7.6* 7.6* 7.7* 7.9*  MG 1.7 1.9 2.0 1.8  --   --   PHOS 6.3* 8.5* 8.5* 6.1*  --   --     Liver Function Tests: No results for input(s): "AST", "ALT", "ALKPHOS", "BILITOT", "PROT", "ALBUMIN" in the last 168 hours.  No results for input(s): "LIPASE", "AMYLASE" in the last 168 hours.  No results for input(s): "AMMONIA" in the last 168 hours.  CBC: Recent Labs  Lab 05/09/23 0536 05/10/23 1220 05/11/23 0800 05/12/23 0809 05/13/23 0610  WBC 5.0 7.6 6.8 7.5 9.8  HGB 8.5* 8.4* 8.3* 8.7* 9.8*  HCT 25.4* 25.0* 25.0* 26.3* 29.0*  MCV 95.1 94.3 95.4 95.6 95.4  PLT 155 169 172 170 198    Cardiac Enzymes: Recent Labs  Lab 05/08/23 0927 05/09/23 0536  CKTOTAL 94 75    BNP: Invalid input(s): "POCBNP"  CBG: Recent Labs  Lab 05/13/23 1250 05/13/23 1623 05/13/23 2108 05/13/23 2151 05/14/23 0835  GLUCAP 78 231* 284* 291* 201*    Microbiology: Results for orders placed or performed during the hospital encounter of 05/05/23  Resp panel by RT-PCR (RSV, Flu A&B, Covid) Anterior Nasal Swab     Status: Abnormal   Collection Time: 05/05/23  4:33 PM   Specimen: Anterior Nasal Swab  Result Value Ref Range Status   SARS Coronavirus 2 by RT PCR POSITIVE (A) NEGATIVE Final    Comment: (NOTE) SARS-CoV-2 target nucleic acids  are DETECTED.  The SARS-CoV-2 RNA is generally detectable in upper respiratory specimens during the acute phase of infection. Positive results are indicative of the presence of the identified virus, but do not rule out bacterial infection or co-infection with other pathogens not detected by the test. Clinical correlation with patient history and other diagnostic information is necessary to determine patient infection status. The expected result is Negative.  Fact Sheet for  Patients: BloggerCourse.com  Fact Sheet for Healthcare Providers: SeriousBroker.it  This test is not yet approved or cleared by the [place] FDA and  has been authorized for detection and/or diagnosis of SARS-CoV-2 by FDA under an Emergency Use Authorization (EUA).  This EUA will remain in effect (meaning this test can be used) for the duration of  the COVID-19 declaration under Section 564(b)(1) of the A ct, 21 U.S.C. section 360bbb-3(b)(1), unless the authorization is terminated or revoked sooner.     Influenza A by PCR NEGATIVE NEGATIVE Final   Influenza B by PCR NEGATIVE NEGATIVE Final    Comment: (NOTE) The Xpert Xpress SARS-CoV-2/FLU/RSV plus assay is intended as an aid in the diagnosis of influenza from Nasopharyngeal swab specimens and should not be used as a sole basis for treatment. Nasal washings and aspirates are unacceptable for Xpert Xpress SARS-CoV-2/FLU/RSV testing.  Fact Sheet for Patients: BloggerCourse.com  Fact Sheet for Healthcare Providers: SeriousBroker.it  This test is not yet approved or cleared by the [place] FDA and has been authorized for detection and/or diagnosis of SARS-CoV-2 by FDA under an Emergency Use Authorization (EUA). This EUA will remain in effect (meaning this test can be used) for the duration of the COVID-19 declaration under Section 564(b)(1) of the Act, 21 U.S.C. section 360bbb-3(b)(1), unless the authorization is terminated or revoked.     Resp Syncytial Virus by PCR NEGATIVE NEGATIVE Final    Comment: (NOTE) Fact Sheet for Patients: BloggerCourse.com  Fact Sheet for Healthcare Providers: SeriousBroker.it  This test is not yet approved or cleared by the [place] FDA and has been authorized for detection and/or diagnosis of SARS-CoV-2 by FDA under an Emergency Use  Authorization (EUA). This EUA will remain in effect (meaning this test can be used) for the duration of the COVID-19 declaration under Section 564(b)(1) of the Act, 21 U.S.C. section 360bbb-3(b)(1), unless the authorization is terminated or revoked.  Performed at [facility], [address] [place], [place] [address]   C Difficile Quick Screen w PCR reflex     Status: None   Collection Time: 05/05/23  7:35 PM   Specimen: STOOL  Result Value Ref Range Status   C Diff antigen NEGATIVE NEGATIVE Final   C Diff toxin NEGATIVE NEGATIVE Final   C Diff interpretation No C. difficile detected.  Final    Comment: Performed at [facility], [address] [place], [place] [address]    Coagulation Studies: No results for input(s): "LABPROT", "INR" in the last 72 hours.   Urinalysis: No results for input(s): "COLORURINE", "LABSPEC", "PHURINE", "GLUCOSEU", "HGBUR", "BILIRUBINUR", "KETONESUR", "PROTEINUR", "UROBILINOGEN", "NITRITE", "LEUKOCYTESUR" in the last 72 hours.  Invalid input(s): "APPERANCEUR"    Imaging: No results found.    Medications:      apixaban  2.5 mg Oral BID   ascorbic acid  250 mg Oral Daily   bisacodyl  10 mg Oral Once   [START ON 05/15/2023] bisacodyl  10 mg Oral QHS   calcium acetate  1,334 mg Oral TID WC   Chlorhexidine Gluconate Cloth  6  each Topical Daily   clotrimazole-betamethasone   Topical BID   dexamethasone  6 mg Oral Daily   dorzolamide-timolol  1 drop Both Eyes Daily   epoetin alfa-epbx (RETACRIT) injection  4,000 Units Intravenous Q M,W,F-HD   feeding supplement (NEPRO CARB STEADY)  237 mL Oral BID BM   fluticasone furoate-vilanterol  1 puff Inhalation Daily   guaiFENesin  600 mg Oral BID   insulin aspart  0-5 Units Subcutaneous QHS   insulin aspart  0-9 Units Subcutaneous TID WC   insulin aspart  4 Units Subcutaneous TID WC   insulin glargine-yfgn  5 Units Subcutaneous Daily   Ipratropium-Albuterol  1 puff Inhalation  Q6H   multivitamin  1 tablet Oral QHS   neomycin-polymyxin b-dexamethasone  1 Application Right Eye TID   pantoprazole  40 mg Oral BID   polyethylene glycol  17 g Oral BID   QUEtiapine  25 mg Oral BID   rosuvastatin  10 mg Oral Once per day on Monday Wednesday Friday   traZODone  50 mg Oral QHS   acetaminophen, albuterol, bisacodyl, dextromethorphan-guaiFENesin, hydrALAZINE, HYDROmorphone **OR** HYDROmorphone (DILAUDID) injection, loperamide, nitroGLYCERIN, ondansetron (ZOFRAN) IV  Assessment/ Plan:  [name] is a 83 y.o.  male with diabetes mellitus type II, hypertension, coronary artery disease, chronic diastolic congestive heart failure, atrial fibrillation, pulmonary hypertension, and historyof subdural hematoma who is admitted for Weakness [R53.1] Fall, initial encounter [W19.XXXA] COVID-19 virus infection [U07.1] COVID-19 [U07.1] Uremia of renal origin [N19]  [facility]/MWF/right chest PermCath  End-stage renal disease on hemodialysis.  Patient will receive dialysis later today at bedside due to COVID status.  COVID-19, found positive this admission.  Receiving supportive care.  Room air  3. Anemia of chronic kidney disease  Lab Results  Component Value Date   HGB 9.8 (L) 05/13/2023    Hemoglobin 9.8.  Continue EPO with dialysis.  Patient receives Mircera at outpatient clinic.   4. Secondary Hyperparathyroidism: with outpatient labs: None available  Lab Results  Component Value Date   PTH 344 (H) 01/07/2023   CALCIUM 7.9 (L) 05/13/2023   CAION 1.14 (L) 12/02/2022   PHOS 6.1 (H) 05/11/2023    Will continue to monitor phosphorus levels.  Continue calcium acetate with meals.   LOS: 7 Faith Branan 2/21/20251:01 PM

## 2023-05-14 NOTE — Progress Notes (Signed)
 Mobility Specialist - Progress Note   05/14/23 1300  Mobility  Activity Turned to left side;Turned to right side  Level of Assistance Maximum assist, patient does 25-49%  Range of Motion/Exercises Active;Right leg;Left leg;Left arm;Right arm;All extremities  Activity Response Tolerated well  Mobility Referral Yes  Mobility visit 1 Mobility  Mobility Specialist Start Time (ACUTE ONLY) 1241  Mobility Specialist Stop Time (ACUTE ONLY) 1258  Mobility Specialist Time Calculation (min) (ACUTE ONLY) 17 min   Pt resting in bed on RA upon entry. Pt agreeable to participate in mobility despite pain. Pt attempted to sit EOB twice Max Assist using the pad for leverage. Pt was unable to progress to EOB due to pain. Pt did perform two rounds of 10 knee to chest exercises LLE/LRE. Pt also did 4 ankle rotations and 8 bilateral foot pumps. Pt agreeable to participate in hoyer lift to recliner tomorrow. Unable to attempt today due to in room dialysis. Author observed positive effort during session.   Johnathan Hausen Mobility Specialist 05/14/23, 1:10 PM

## 2023-05-14 NOTE — Progress Notes (Signed)
 Patient refused to allow NT and RN on 2 separate occasions to check blood sugar. Patient became agitated and aggressive when asked, telling staff "get the hell out of here" "don't touch me you crazy doctors". Patient also refusing to take lunch time medications and allow NT to do CHG bath. Physician made aware.  1355 Haldol administered as per psychiatry. After administration patient allowed staff to check blood sugar, give CHG bath, and reposition patient in bed to a more comfortable position in attempts to alleviate pain he reports on his bottom. Patient now resting more comfortably.

## 2023-05-14 NOTE — Consult Note (Signed)
 [facility] Face-to-Face Psychiatry Consult   Reason for Consult:  Agitation Referring Physician:  Gillis Santa, MD  Patient Identification: Kyle Roberson MRN:  [id number] Principal Diagnosis: COVID-19 Diagnosis:  Principal Problem:   COVID-19 Active Problems:   Essential hypertension   CAD (coronary artery disease)   Paroxysmal atrial fibrillation (HCC)   Dyslipidemia   ESRD on dialysis (HCC)   Weakness   Myocardial injury   Type II diabetes mellitus with renal manifestations (HCC)   Diarrhea   Fall   Obesity (BMI 30-39.9)   Demand ischemia (HCC)   Uremia of renal origin     HPI:   Kyle Roberson is a 83 y.o. male with medical history significant of ESRD-HD (MWF), HTN, HLD, DM, CAD with stent, dCHF, depression with anxiety, BPH, anemia, A-fib on Eliquis, left eye blindness, who presents with cough, shortness of breath, diarrhea, fall.   Chart reviewed, patient seen today for agitation, depression, and anxiety.  Patient was in bed, disheveled.  At the time he was using profanity.  Patient reported irritable mood.  Patient did not participate much in interview.  He answered some yes or no questions.  Apparently patient had received Haldol as needed for agitation.  Patient's nurse was in the room, she reported that patient has responded well to as needed medicine.  He was more cooperative.  The nurse mother reported that the patient allowed time to check patient's blood sugar and give CHG bath.  Past Psychiatric History: Reportedly patient has history of depression and anxiety  Risk to Self:  Unable to assess Risk to Others:  Unable to assess Prior Inpatient Therapy:  Unable to assess Prior Outpatient Therapy:  Unable to assess  Past Medical History:  Past Medical History:  Diagnosis Date   AKI (acute kidney injury) (HCC) 07/06/2018   CAD (coronary artery disease)    a. Remote PCI/stenting to LAD w PTCA Diagnoal. RCA 60%; b.  2005/2008 Cardiolites w/ reportedly mild ischemia  in Diag territory-->Med rx.   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 04/2019 Echo: EF 55-60%, no rwma, nl RV fxn, RVSP 55.30mmHg. Mod dil LA. Mild-mod MR. Mod dil PA.   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 04/2019 Echo: RVSP 55.92mmHg.   Permanent atrial fibrillation (HCC)    a. CHA2DS2VASc = 5-->dose adjusted eliquis (age/creat).   Subdural hematoma (HCC)    a. 01/2021 in setting of fall s/p L frontotemporal craniotomy.    Past Surgical History:  Procedure Laterality Date   BLEPHAROPLASTY     CARDIAC CATHETERIZATION     CORONARY STENT INTERVENTION     CRANIOTOMY Left 02/04/2021   Procedure: CRANIOTOMY FOR LEFT SUBDURAL  HEMATOMA EVACUATION;  Surgeon: Lucy Chris, MD;  Location: [facility] ORS;  Service: Neurosurgery;  Laterality: Left;   CYSTOSCOPY     DIALYSIS/PERMA CATHETER INSERTION N/A 12/14/2022   Procedure: DIALYSIS/PERMA CATHETER INSERTION;  Surgeon: Annice Needy, MD;  Location: [facility] INVASIVE CV LAB;  Service: Cardiovascular;  Laterality: N/A;   DIALYSIS/PERMA CATHETER REPAIR N/A 12/16/2022   Procedure: DIALYSIS/PERMA CATHETER REPAIR;  Surgeon: Annice Needy, MD;  Location: [facility] INVASIVE CV LAB;  Service: Cardiovascular;  Laterality: N/A;   HERNIA REPAIR  RIGHT HEART CATH N/A 12/02/2022   Procedure: RIGHT HEART CATH;  Surgeon: Laurey Morale, MD;  Location: [facility] INVASIVE CV LAB;  Service: Cardiovascular;  Laterality: N/A;   Family History:  Family History  Problem Relation Age of Onset   Pancreatic cancer Mother    CAD Father    Diabetes Brother     Social History:  Social History   Substance and Sexual Activity  Alcohol Use Not Currently   Alcohol/week: 2.0 standard drinks of  alcohol   Types: 2 Glasses of wine per week     Social History   Substance and Sexual Activity  Drug Use Not Currently    Social History   Socioeconomic History   Marital status: Married    Spouse name: Not on file   Number of children: Not on file   Years of education: Not on file   Highest education level: Not on file  Occupational History   Not on file  Tobacco Use   Smoking status: Former   Smokeless tobacco: Never  Vaping Use   Vaping status: Never Used  Substance and Sexual Activity   Alcohol use: Not Currently    Alcohol/week: 2.0 standard drinks of alcohol    Types: 2 Glasses of wine per week   Drug use: Not Currently   Sexual activity: Not on file  Other Topics Concern   Not on file  Social History Narrative   Lives at home with wife   Social Drivers of Corporate investment banker Strain: Not on file  Food Insecurity: No Food Insecurity (05/07/2023)   Hunger Vital Sign    Worried About Running Out of Food in the Last Year: Never true    Ran Out of Food in the Last Year: Never true  Transportation Needs: No Transportation Needs (05/08/2023)   PRAPARE - Administrator, Civil Service (Medical): No    Lack of Transportation (Non-Medical): No  Physical Activity: Not on file  Stress: Not on file  Social Connections: Not on file   Additional Social History:  Per chart review patient is married and lives with his wife  Allergies:   Allergies  Allergen Reactions   Atorvastatin Hives, Itching and Other (See Comments)    Other reaction(s): Other (See Comments)   Simvastatin Hives, Itching and Other (See Comments)    Other reaction(s): Other (See Comments)    Labs:  Results for orders placed or performed during the hospital encounter of 05/05/23 (from the past 48 hours)  Glucose, capillary     Status: None   Collection Time: 05/13/23 12:50 PM  Result Value Ref Range   Glucose-Capillary 78 70 - 99 mg/dL    Comment: Glucose reference range  applies only to samples taken after fasting for at least 8 hours.  Glucose, capillary     Status: Abnormal   Collection Time: 05/13/23  4:23 PM  Result Value Ref Range   Glucose-Capillary 231 (H) 70 - 99 mg/dL    Comment: Glucose reference range applies only to samples taken after fasting for at least 8 hours.  Glucose, capillary     Status: Abnormal   Collection Time: 05/13/23  9:08 PM  Result Value Ref Range   Glucose-Capillary 284 (H) 70 - 99 mg/dL    Comment: Glucose reference range applies only to samples taken after fasting for at least 8 hours.  Glucose, capillary     Status: Abnormal   Collection Time: 05/13/23  9:51 PM  Result Value  Ref Range   Glucose-Capillary 291 (H) 70 - 99 mg/dL    Comment: Glucose reference range applies only to samples taken after fasting for at least 8 hours.  Glucose, capillary     Status: Abnormal   Collection Time: 05/14/23  8:35 AM  Result Value Ref Range   Glucose-Capillary 201 (H) 70 - 99 mg/dL    Comment: Glucose reference range applies only to samples taken after fasting for at least 8 hours.  Glucose, capillary     Status: Abnormal   Collection Time: 05/14/23  2:02 PM  Result Value Ref Range   Glucose-Capillary 221 (H) 70 - 99 mg/dL    Comment: Glucose reference range applies only to samples taken after fasting for at least 8 hours.  Glucose, capillary     Status: Abnormal   Collection Time: 05/14/23  4:55 PM  Result Value Ref Range   Glucose-Capillary 212 (H) 70 - 99 mg/dL    Comment: Glucose reference range applies only to samples taken after fasting for at least 8 hours.  Glucose, capillary     Status: None   Collection Time: 05/15/23  8:28 AM  Result Value Ref Range   Glucose-Capillary 95 70 - 99 mg/dL    Comment: Glucose reference range applies only to samples taken after fasting for at least 8 hours.    Current Facility-Administered Medications  Medication Dose Route Frequency Provider Last Rate Last Admin   acetaminophen  (TYLENOL) tablet 650 mg  650 mg Oral Q6H PRN Gillis Santa, MD       albuterol (VENTOLIN HFA) 108 (90 Base) MCG/ACT inhaler 2 puff  2 puff Inhalation Q4H PRN Lorretta Harp, MD       apixaban Everlene Balls) tablet 2.5 mg  2.5 mg Oral BID Antonieta Iba, MD   2.5 mg at 05/14/23 0981   ascorbic acid (VITAMIN C) tablet 250 mg  250 mg Oral Daily Lorretta Harp, MD   250 mg at 05/14/23 1914   bisacodyl (DULCOLAX) EC tablet 10 mg  10 mg Oral QHS Gillis Santa, MD       bisacodyl (DULCOLAX) suppository 10 mg  10 mg Rectal Daily PRN Gillis Santa, MD       calcium acetate (PHOSLO) capsule 1,334 mg  1,334 mg Oral TID WC Wendee Beavers, NP   1,334 mg at 05/14/23 1704   Chlorhexidine Gluconate Cloth 2 % PADS 6 each  6 each Topical Daily Gillis Santa, MD   6 each at 05/14/23 7829   clotrimazole-betamethasone (LOTRISONE) cream   Topical BID Gillis Santa, MD   Given at 05/14/23 [id number]   dexamethasone (DECADRON) tablet 4 mg  4 mg Oral Daily Gillis Santa, MD       Followed by   Melene Muller ON 05/17/2023] dexamethasone (DECADRON) tablet 2 mg  2 mg Oral Daily Gillis Santa, MD       dextromethorphan-guaiFENesin (MUCINEX DM) 30-600 MG per 12 hr tablet 1 tablet  1 tablet Oral BID PRN Lorretta Harp, MD       dorzolamide-timolol (COSOPT) 2-0.5 % ophthalmic solution 1 drop  1 drop Both Eyes Daily Lorretta Harp, MD   1 drop at 05/14/23 0956   epoetin alfa-epbx (RETACRIT) injection 4,000 Units  4,000 Units Intravenous Q M,W,F-HD Wendee Beavers, NP   4,000 Units at 05/12/23 1602   feeding supplement (NEPRO CARB STEADY) liquid 237 mL  237 mL Oral BID BM Breeze, Shantelle, NP   237 mL at 05/13/23 1613   fluticasone furoate-vilanterol (BREO ELLIPTA) 200-25 MCG/ACT  1 puff  1 puff Inhalation Daily Gillis Santa, MD   1 puff at 05/11/23 1204   guaiFENesin (MUCINEX) 12 hr tablet 600 mg  600 mg Oral BID Gillis Santa, MD   600 mg at 05/14/23 1610   haloperidol lactate (HALDOL) injection 2 mg  2 mg Intravenous Q6H PRN Gillis Santa, MD   2 mg at  05/14/23 2236   hydrALAZINE (APRESOLINE) injection 5 mg  5 mg Intravenous Q2H PRN Lorretta Harp, MD       HYDROmorphone (DILAUDID) tablet 2 mg  2 mg Oral Q6H PRN Gillis Santa, MD       Or   HYDROmorphone (DILAUDID) injection 1 mg  1 mg Intravenous Q4H PRN Gillis Santa, MD       insulin aspart (novoLOG) injection 0-5 Units  0-5 Units Subcutaneous QHS Lorretta Harp, MD   4 Units at 05/11/23 2225   insulin aspart (novoLOG) injection 0-9 Units  0-9 Units Subcutaneous TID WC Lorretta Harp, MD   3 Units at 05/14/23 1704   insulin aspart (novoLOG) injection 4 Units  4 Units Subcutaneous TID WC Gillis Santa, MD   4 Units at 05/13/23 1800   insulin glargine-yfgn [facility]) injection 5 Units  5 Units Subcutaneous Daily Gillis Santa, MD   5 Units at 05/14/23 9604   Ipratropium-Albuterol (COMBIVENT) respimat 1 puff  1 puff Inhalation Q6H Gillis Santa, MD   1 puff at 05/12/23 1057   loperamide (IMODIUM) capsule 2 mg  2 mg Oral BID PRN Lorretta Harp, MD   2 mg at 05/06/23 5409   multivitamin (RENA-VIT) tablet 1 tablet  1 tablet Oral Marice Potter, MD   1 tablet at 05/13/23 2102   neomycin-polymyxin b-dexamethasone (MAXITROL) ophthalmic ointment 1 Application  1 Application Right Eye TID Lorretta Harp, MD   1 Application at 05/14/23 1704   nitroGLYCERIN (NITROLINGUAL) 0.4 MG/SPRAY spray 1 spray  1 spray Sublingual PRN Lorretta Harp, MD       ondansetron [facility]) injection 4 mg  4 mg Intravenous Q8H PRN Lorretta Harp, MD   4 mg at 05/10/23 2152   pantoprazole (PROTONIX) EC tablet 40 mg  40 mg Oral BID Orson Aloe, RPH   40 mg at 05/14/23 8119   polyethylene glycol (MIRALAX / GLYCOLAX) packet 17 g  17 g Oral BID Gillis Santa, MD   17 g at 05/14/23 1478   QUEtiapine (SEROQUEL) tablet 50 mg  50 mg Oral BID Gillis Santa, MD       rosuvastatin (CRESTOR) tablet 10 mg  10 mg Oral Once per day on Monday Wednesday Friday Lorretta Harp, MD   10 mg at 05/14/23 2956   traZODone (DESYREL) tablet 50 mg  50 mg Oral QHS Gillis Santa, MD    50 mg at 05/13/23 2100    Musculoskeletal: Strength & Muscle Tone: within normal limits Gait & Station: Not assessed patient in bed   Psychiatric Specialty Exam:  Presentation  General Appearance: Disheveled  Eye Contact:Minimal; Poor  Speech:Garbled  Speech Volume:Increased  Mood and Affect  Mood:Irritable  Affect:Congruent   Thought Process  Thought Processes: Unable to assess  Descriptions of Associations: Unable to assess  Orientation:Unable to assess  Thought Content:Rumination; Scattered  History of Schizophrenia/Schizoaffective disorder: Unknown Duration of Psychotic Symptoms:NA Hallucinations: Unable to assess  ideas of Reference:None  Suicidal Thoughts: None reported by the staff Homicidal Thoughts: None reported by the staff  Sensorium  Memory: Unable to assess Judgment:Impaired  Insight:Fair   Executive Functions  Concentration:Poor  Attention Span:Poor  Language:Fair   Psychomotor Activity  Psychomotor Activity:Psychomotor Activity: Increased   Assets  Assets:Desire for Improvement   Sleep  Sleep:Sleep: Poor   Physical Exam: Physical Exam Constitutional:      Appearance: He is ill-appearing.  HENT:     Head: Atraumatic.     Nose: Congestion present.     Mouth/Throat:     Mouth: Mucous membranes are moist.  Cardiovascular:     Rate and Rhythm: Regular rhythm.  Skin:    General: Skin is warm.  Neurological:     Mental Status: He is disoriented.    Review of Systems  Respiratory:  Positive for cough.   Cardiovascular:  Negative for chest pain.  Gastrointestinal:  Negative for nausea and vomiting.  Neurological:  Positive for headaches.   Blood pressure (!) 144/83, pulse (!) 34, temperature 98 F (36.7 C), temperature source Oral, resp. rate 18, height 5\' 9"  (1.753 m), weight 93.9 kg, SpO2 100%. Body mass index is 30.57 kg/m.  Treatment Plan Summary  Jacob Cicero is a 83 y.o. male with medical history  significant of ESRD-HD (MWF), HTN, HLD, DM, CAD with stent, dCHF, depression with anxiety, BPH, anemia, A-fib on Eliquis, left eye blindness, who presents with cough, shortness of breath, diarrhea, fall.  Recommendations At this point in time, patient is not able to fully participate in the interview secondary to delirium, probably related to COVID infection.  Per staff report patient has responded well to as needed Haldol.  At this point in time we will recommend to continue treating patient's agitation with as needed Haldol.  Will continue to follow patient on medical floor.  Disposition: Per primary team  Lewanda Rife, MD 05/15/2023 9:44 AM

## 2023-05-14 NOTE — Progress Notes (Addendum)
 [facility] Progress Note  Patient: Kyle Roberson    ZOX:[id number]  DOA: 05/05/2023     Date of Service: the patient was seen and examined on 05/14/2023  Chief Complaint  Patient presents with   Fall   Brief hospital course:  Izaiah Tabb is a 83 y.o. male with medical history significant of ESRD-HD (MWF), HTN, HLD, DM, CAD with stent, dCHF, depression with anxiety, BPH, anemia, A-fib on Eliquis, left eye blindness, who presents with cough, shortness of breath, diarrhea, fall.   Per pt and his wife (I called his wife by phone), patient has productive cough, sore throat, SOB, malaise for more than 5 days.  No chest pain.  No fever or chills.  Patient has diarrhea for more than 10 days, with 2- 3 times of watery diarrhea each day.  No nausea, vomiting or abdominal pain.  Patient fell this morning, no loss of consciousness. He patient states that he was going to the bathroom when he suddenly felt weak in his gave out and fell.  No unilateral numbness or tingling in extremities.  No facial droop or slurred speech.  Patient states that he has missed several dialysis. His last dialysis was on Monday.     Data reviewed independently and ED Course: pt was found to have positive COVID 19, WBC 5.6, troponin 95 --> 105, potassium 4.7, bicarbonate 18, creatinine 10.02, BUN 102.  CT head negative. Chest x-ray showed cardiomegaly with vascular congestion.  Patient is placed on telemetry bed for observation.     EKG: I have personally reviewed.  A-fib, QTc 461, low voltage, poor IV progression, anteroseptal infarction pattern.   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 nightly and Seroquel 25 mg p.o. twice daily 2/20 psych consulted for further recommendation 2/21 patient was angry and cursing everyone.  Requested psych to eval him for decision-making capacity and further management  of his depression and generalized anxiety disorder Started Haldol IV as needed   # COVID-19 virus infection: No fever, no leukocytosis, no oxygen desaturation.   Chest x-ray showed vascular congestion, no infiltration. -Bronchodilators, Mucinex 600 mg p.o. twice daily, Robitussin DM as needed for cough -Continue supportive care 2/15 still sob after HD, may benefit from steroids and breathing treatments S/p Prednisone 40 mg p.o. daily x 3 days, Breo Ellipta inhaler Continue albuterol as needed 2/16 patient is refusing inhalers, started on nebulizer treatments if he agrees. 2/18 started Decadron 6 mg p.o. daily x 4 days til 2/21 2/21 decreased Decadron 4 mg p.o. daily for 2 days followed by 2 mg p.o. daily for 2 days due to mental status changes.  Steroids may be contributing. Patient is refusing inhalers   # CAD (coronary artery disease) and Myocardial injury:  Troponin 894, but No chest pain and no significant EKG changes Continue to monitor on telemetry S/p ASA 81 mg and heparin IV infusion, discontinued on 2/14, due to GI bleed 2/14 continue to hold Eliquis for now Cardiology consulted, recommended no ischemic workup at this time, follow as an outpatient for Myoview when recovered after COVID infection TTE shows LVEF 55 to 60%, grade 2 diastolic dysfunction, no wall motion abnormality.  Severe pulmonary hypertension, RA moderately dilated.  Moderate TR As per cardio patient may need right and left heart cath due to pulmonary hypertension and elevated troponin.  # Paroxysmal atrial fibrillation: -Tele monitoring - s/p heparin IV infusion, Hb dropped, suspected GI  bleed.  Patient was cleared by GI to continue heparin IV infusion.  2/15 resumed Eliquis 2.5 mg p.o. 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 2/20 Hb 9.8 stable   # ESRD on dialysis -consulted nephrology Continue hemodialysis MWF schedule Hyperphosphatemia, started PhosLo   # Fall at home, initial encounter: CT-head negative -Fall precaution, CK level wnl -PT/OT eval pending  # Type II diabetes mellitus with renal manifestations:  Recent A1c 7.3, poorly controlled for patient taking glipizide at home -SSI 2/18 started Semglee 5 units subcu daily and NovoLog 4 units 3 times daily due to hyperglycemia secondary to steroids. Follow diabetic coordinator and titrate insulin dose accordingly. Monitor CBG and continue diabetic diet   # Essential hypertension: Patient's not taking medications.   Nonsustained V. tach episode 7 beats run at 630 on 2/13 reported 2/13 started metoprolol 12.5 mg p.o. 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 Difficile negative Now patient developed constipation.  # Constipation, started laxatives on 2/19 2/21 started Dulcolax p.o. Use suppository and enema as needed  Body mass index is 31.09 kg/m.  Interventions:  Diet: Renal diet DVT Prophylaxis: Heparin IV infusion  Advance goals of care discussion: Full code  Family Communication: family was not present at bedside, at the time of interview.  The pt provided permission to discuss medical plan with the family. Opportunity was given to ask question and all questions were answered satisfactorily. 2/20 discussed with patient's  wife over the phone.  She will get in touch with TOC for disposition plan   Disposition:  Pt is from home, admitted with COVID viral infection, missed hemodialysis, elevated troponin s/p IV heparin infusion, still bedbound, which precludes a safe discharge. Discharge to SNF vs Home with HH, TBD after PT and OT eval, patient does not participate with PT due to behavior issue. F/u PT and OT eval Follow TOC for placement Follow psych for decision-making capacity and management of behavioral issues, depression, GAD   Subjective: No significant events overnight.  Patient was lying in the bed with shortness of breath, did not offer any complaints and he started yelling and cursing and stated to go out of the room.  He did not let me listen to his lungs.    Physical Exam: General: Mild-Mod respiratory distress, very angry and aggressive, abusive language Eyes: PERRLA ENT: Oral Mucosa Clear, moist  Neck: no JVD,  Cardiovascular: S1 and S2 Present, no Murmur,  Respiratory: Equal air entry bilaterally, mild crackles, significant wheezes Abdomen: Bowel Sound present, Soft and no tenderness,  Skin: no rashes Extremities: Chronic lymphedema and chronic skin changes, dry scaly skin, improving Neurologic: without any new focal findings Gait not checked due to patient safety concerns  Vitals:   05/13/23 0830 05/13/23 1757 05/14/23 0025 05/14/23 0832  BP: (!) 155/64 (!) 112/57 (!) 131/55 (!) 106/53  Pulse: (!) 55 61 (!) 58   Resp: 20 20 18 16   Temp: 99 F (37.2 C) 97.9 F (36.6 C) 97.7 F (36.5 C) 98.3 F (36.8 C)  TempSrc: Oral Oral Oral   SpO2: 100% 94% 97% 100%  Weight:      Height:       No intake or output data in the 24 hours ending 05/14/23 1540  Filed Weights   05/11/23 0500 05/12/23 0500 05/12/23 1545  Weight: 95.5 kg 95.5 kg 95.5 kg    Data Reviewed: I have personally reviewed and interpreted daily labs, tele strips, imagings as discussed above. I reviewed all nursing  notes, pharmacy notes, vitals, pertinent old records I have discussed plan of care as described above with RN and patient/family.  CBC: Recent Labs  Lab 05/09/23 0536 05/10/23 1220 05/11/23 0800 05/12/23 0809 05/13/23 0610  WBC 5.0 7.6 6.8 7.5 9.8  HGB 8.5* 8.4* 8.3* 8.7* 9.8*  HCT 25.4* 25.0* 25.0* 26.3* 29.0*  MCV 95.1 94.3 95.4 95.6 95.4  PLT 155 169 172 170 198   Basic Metabolic Panel: Recent Labs  Lab 05/08/23 0035 05/09/23 0536 05/10/23 1220 05/11/23 0800 05/12/23 0809 05/13/23 0610  NA 137 138 138 135 137 136  K 3.7 4.4 4.4 4.3 5.0 4.1  CL 99 98 99 96* 99 99  CO2 19* 19* 21* 22 21* 24  GLUCOSE 188* 181* 179* 175* 234* 151*  BUN 78* 96* 119* 75* 98* 60*  CREATININE 7.23* 9.05* 10.78* 7.12* 8.58* 5.69*  CALCIUM 7.5* 7.7* 7.6* 7.6* 7.7* 7.9*  MG 1.7 1.9 2.0 1.8  --   --   PHOS 6.3* 8.5* 8.5* 6.1*  --   --     Studies: No results found.   Scheduled Meds:  apixaban  2.5 mg Oral BID   ascorbic acid  250 mg Oral Daily   bisacodyl  10 mg Oral Once   [START ON 05/15/2023] bisacodyl  10 mg Oral QHS   calcium acetate  1,334 mg Oral TID WC   Chlorhexidine Gluconate Cloth  6 each Topical Daily   clotrimazole-betamethasone   Topical BID   dexamethasone  6 mg Oral Daily   dorzolamide-timolol  1 drop Both Eyes Daily   epoetin alfa-epbx (RETACRIT) injection  4,000 Units Intravenous Q M,W,F-HD   feeding supplement (NEPRO CARB STEADY)  237 mL Oral BID BM   fluticasone furoate-vilanterol  1 puff Inhalation Daily   guaiFENesin  600 mg Oral BID   insulin aspart  0-5 Units Subcutaneous QHS   insulin aspart  0-9 Units Subcutaneous TID WC   insulin aspart  4 Units Subcutaneous TID WC   insulin glargine-yfgn  5 Units Subcutaneous Daily   Ipratropium-Albuterol  1 puff Inhalation Q6H   multivitamin  1 tablet Oral QHS   neomycin-polymyxin b-dexamethasone  1 Application Right Eye TID   pantoprazole  40 mg Oral BID   polyethylene glycol  17 g Oral BID   QUEtiapine  50 mg Oral  BID   rosuvastatin  10 mg Oral Once per day on Monday Wednesday Friday   traZODone  50 mg Oral QHS   Continuous Infusions:   PRN Meds: acetaminophen, albuterol, bisacodyl, dextromethorphan-guaiFENesin, haloperidol lactate, hydrALAZINE, HYDROmorphone **OR** HYDROmorphone (DILAUDID) injection, loperamide, nitroGLYCERIN, ondansetron (ZOFRAN) IV  Time spent: 55 minutes  Author: Gillis Santa. MD [facility] 05/14/2023 3:40 PM  To reach On-call, see care teams to locate the attending and reach out to them via [email or web address]. If 7PM-7AM, please contact night-coverage If  you still have difficulty reaching the attending provider, please page the [facility] (Director on Call) for [facility] on amion for assistance.

## 2023-05-15 DIAGNOSIS — I4891 Unspecified atrial fibrillation: Secondary | ICD-10-CM | POA: Diagnosis not present

## 2023-05-15 DIAGNOSIS — W19XXXA Unspecified fall, initial encounter: Secondary | ICD-10-CM | POA: Diagnosis not present

## 2023-05-15 DIAGNOSIS — E1122 Type 2 diabetes mellitus with diabetic chronic kidney disease: Secondary | ICD-10-CM

## 2023-05-15 DIAGNOSIS — I5A Non-ischemic myocardial injury (non-traumatic): Secondary | ICD-10-CM | POA: Diagnosis not present

## 2023-05-15 DIAGNOSIS — R410 Disorientation, unspecified: Secondary | ICD-10-CM

## 2023-05-15 DIAGNOSIS — U071 COVID-19: Secondary | ICD-10-CM | POA: Diagnosis not present

## 2023-05-15 DIAGNOSIS — I25118 Atherosclerotic heart disease of native coronary 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: Secondary | ICD-10-CM | POA: Diagnosis not present

## 2023-05-15 LAB — CBC
HCT: 28.9 % — ABNORMAL LOW (ref 39.0–52.0)
Hemoglobin: 9.5 g/dL — ABNORMAL LOW (ref 13.0–17.0)
MCH: 31.8 pg (ref 26.0–34.0)
MCHC: 32.9 g/dL (ref 30.0–36.0)
MCV: 96.7 fL (ref 80.0–100.0)
Platelets: 231 10*3/uL (ref 150–400)
RBC: 2.99 MIL/uL — ABNORMAL LOW (ref 4.22–5.81)
RDW: 13.7 % (ref 11.5–15.5)
WBC: 13.5 10*3/uL — ABNORMAL HIGH (ref 4.0–10.5)
nRBC: 0 % (ref 0.0–0.2)

## 2023-05-15 LAB — RENAL FUNCTION PANEL
Albumin: 2.6 g/dL — ABNORMAL LOW (ref 3.5–5.0)
Anion gap: 19 — ABNORMAL HIGH (ref 5–15)
BUN: 109 mg/dL — ABNORMAL HIGH (ref 8–23)
CO2: 21 mmol/L — ABNORMAL LOW (ref 22–32)
Calcium: 8.3 mg/dL — ABNORMAL LOW (ref 8.9–10.3)
Chloride: 100 mmol/L (ref 98–111)
Creatinine, Ser: 9.15 mg/dL — ABNORMAL HIGH (ref 0.61–1.24)
GFR, Estimated: 5 mL/min — ABNORMAL LOW (ref >60)
Glucose, Bld: 106 mg/dL — ABNORMAL HIGH (ref 70–99)
Phosphorus: 7.7 mg/dL — ABNORMAL HIGH (ref 2.5–4.6)
Potassium: 5.3 mmol/L — ABNORMAL HIGH (ref 3.5–5.1)
Sodium: 140 mmol/L (ref 135–145)

## 2023-05-15 LAB — GLUCOSE, CAPILLARY
Glucose-Capillary: 95 mg/dL (ref 70–99)
Glucose-Capillary: 95 mg/dL (ref 70–99)
Glucose-Capillary: 121 mg/dL — ABNORMAL HIGH (ref 70–99)

## 2023-05-15 LAB — MRSA NEXT GEN BY PCR, NASAL: MRSA by PCR Next Gen: NOT DETECTED

## 2023-05-15 MED ORDER — SODIUM CHLORIDE 0.9 % IV BOLUS
500.0000 mL | Freq: Once | INTRAVENOUS | Status: AC
  Administered 2023-05-15: 500 mL via INTRAVENOUS

## 2023-05-15 MED ORDER — SODIUM CHLORIDE 0.9 % IV BOLUS
250.0000 mL | Freq: Once | INTRAVENOUS | Status: AC
  Administered 2023-05-15: 250 mL via INTRAVENOUS

## 2023-05-15 MED ORDER — ALBUTEROL SULFATE (2.5 MG/3ML) 0.083% IN NEBU: 2.5000 mg | INHALATION_SOLUTION | RESPIRATORY_TRACT | Status: DC | PRN

## 2023-05-15 MED ORDER — IPRATROPIUM-ALBUTEROL 0.5-2.5 (3) MG/3ML IN SOLN
3.0000 mL | Freq: Four times a day (QID) | RESPIRATORY_TRACT | Status: DC
  Administered 2023-05-15 – 2023-05-17 (×5): 3 mL via RESPIRATORY_TRACT
  Filled 2023-05-15 (×8): qty 3

## 2023-05-15 MED ORDER — BUDESONIDE 0.5 MG/2ML IN SUSP
0.5000 mg | Freq: Two times a day (BID) | RESPIRATORY_TRACT | Status: DC
  Administered 2023-05-15 – 2023-05-24 (×15): 0.5 mg via RESPIRATORY_TRACT
  Filled 2023-05-15 (×18): qty 2

## 2023-05-15 MED ORDER — MIDODRINE HCL 5 MG PO TABS
10.0000 mg | ORAL_TABLET | Freq: Three times a day (TID) | ORAL | Status: DC
  Administered 2023-05-15 – 2023-05-25 (×26): 10 mg via ORAL
  Filled 2023-05-15 (×26): qty 2

## 2023-05-15 MED ORDER — EPOETIN ALFA-EPBX 4000 UNIT/ML IJ SOLN
INTRAMUSCULAR | Status: AC
  Filled 2023-05-15: qty 1

## 2023-05-15 NOTE — Progress Notes (Signed)
 Mobility Specialist - Progress Note    05/15/23 1400  Mobility  Activity Turned to right side;Turned to left side;Repositioned in chair;Dangled on edge of bed  Level of Assistance +2 (takes two people)  Programmer, systems / Armed forces training and education officer of Motion/Exercises Active  Activity Response Tolerated well  Mobility Referral Yes  Mobility visit 1 Mobility  Mobility Specialist Start Time (ACUTE ONLY) 1420  Mobility Specialist Stop Time (ACUTE ONLY) 1441  Mobility Specialist Time Calculation (min) (ACUTE ONLY) 21 min   Pt resting in bed on RA upon entry. Pt endorsed pain throughout the session but still agreeable to participate. Pt transferred to recliner in hoyer lift and required several pillows to prop up on left side due to leaning. Pt left in recliner with needs in reach and chair alarm activated.   Johnathan Hausen Mobility Specialist 05/15/23, 3:08 PM

## 2023-05-15 NOTE — Significant Event (Signed)
 Rapid Response Event Note   Reason for Call : Called RRT for low BP and HR reported ever since returning from dialysis.   Initial Focused Assessment: Laying in bed, wakes up, answers questions. Cussing at staff. See flowsheets for VS.      Interventions: Per Dr Nelson Chimes move to stepdown for close monitoring in case need of vasopressor arises.   Plan of Care: as above    Event Summary: as above  MD Notified: Amin 1811 Call Time:1809 Arrival Time:1811 End [id number]  [name] Ramus, RN

## 2023-05-15 NOTE — Progress Notes (Signed)
 Received patient at 1830 who was admitted for low B/P.  Upon arrival patient was irate and angry to be on the unit of which he expressed with profound profanities in a loud manner.  Skin assessment completed, unable to safely perform a full assessment at this time due to patients state of anger.  Wife brought to bedside, endorsed to oncoming shift.

## 2023-05-15 NOTE — Progress Notes (Signed)
 [facility] Progress Note  Patient: Kyle Roberson    ZOX:[id number]  DOA: 05/05/2023     Date of Service: the patient was seen and examined on 05/15/2023  Chief Complaint  Patient presents with   Fall   Brief hospital course:  Kyle Roberson is a 83 y.o. male with medical history significant of ESRD-HD (MWF), HTN, HLD, DM, CAD with stent, dCHF, depression with anxiety, BPH, anemia, A-fib on Eliquis, left eye blindness, who presents with cough, shortness of breath, diarrhea, fall.   Per pt and his wife (I called his wife by phone), patient has productive cough, sore throat, SOB, malaise for more than 5 days.  No chest pain.  No fever or chills.  Patient has diarrhea for more than 10 days, with 2- 3 times of watery diarrhea each day.  No nausea, vomiting or abdominal pain.  Patient fell this morning, no loss of consciousness. He patient states that he was going to the bathroom when he suddenly felt weak in his gave out and fell.  No unilateral numbness or tingling in extremities.  No facial droop or slurred speech.  Patient states that he has missed several dialysis. His last dialysis was on Monday.     Data reviewed independently and ED Course: pt was found to have positive COVID 19, WBC 5.6, troponin 95 --> 105, potassium 4.7, bicarbonate 18, creatinine 10.02, BUN 102.  CT head negative. Chest x-ray showed cardiomegaly with vascular congestion.  Patient is placed on telemetry bed for observation.     EKG: I have personally reviewed.  A-fib, QTc 461, low voltage, poor IV progression, anteroseptal infarction pattern.  2/22: Hemodynamically stable, getting dialysis.  Lengthy discussion with wife on phone to make decision regarding SNF versus home health, patient does not want to go to rehab himself and becoming agitated and combative. Psych was also consulted for capacity evaluation-pending. Currently being managed with as needed Haldol.  Assessment and Plan:  # Mood disorder with  behavioral issues, noncompliance and 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 nightly and Seroquel 25 mg p.o. twice daily 2/20 psych consulted for further recommendation 2/21 patient was angry and cursing everyone.  Requested psych to eval him for decision-making capacity and further management of his depression and generalized anxiety disorder Started Haldol IV as needed -Pending psych evaluation and recommendation  # COVID-19 virus infection: No fever, no leukocytosis, no oxygen desaturation.   Chest x-ray showed vascular congestion, no infiltration. -Bronchodilators, Mucinex 600 mg p.o. twice daily, Robitussin DM as needed for cough -Continue supportive care 2/15 still sob after HD, may benefit from steroids and breathing treatments S/p Prednisone 40 mg p.o. daily x 3 days, Breo Ellipta inhaler Continue albuterol as needed 2/16 patient is refusing inhalers, started on nebulizer treatments if he agrees. 2/18 started Decadron 6 mg p.o. daily x 4 days til 2/21 2/21 decreased Decadron 4 mg p.o. daily for 2 days followed by 2 mg p.o. daily for 2 days due to mental status changes.  Steroids may be contributing. Patient is refusing inhalers   # CAD (coronary artery disease) and Myocardial injury:  Troponin 894, but No chest pain and no significant EKG changes Continue to monitor on telemetry S/p ASA 81 mg and heparin IV infusion, discontinued on 2/14, due to GI bleed 2/14 continue to hold Eliquis for now Cardiology consulted, recommended no ischemic workup at this time, follow as an outpatient for Myoview when recovered after COVID infection TTE shows LVEF 55 to 60%,  grade 2 diastolic dysfunction, no wall motion abnormality.  Severe pulmonary hypertension, RA moderately dilated.  Moderate TR As per cardio patient may need right and left heart cath due to pulmonary hypertension and elevated troponin.  # Paroxysmal atrial fibrillation: -Tele monitoring - s/p  heparin IV infusion, Hb dropped, suspected GI bleed.  Patient was cleared by GI to continue heparin IV infusion.  2/15 resumed Eliquis 2.5 mg p.o. 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 Current hemoglobin seems stable around 9.5   # ESRD on dialysis -consulted nephrology Continue hemodialysis MWF schedule Hyperphosphatemia, started PhosLo   # Fall at home, initial encounter: CT-head negative -Fall precaution, CK level wnl -PT/OT -recommending SNF but patient is refusing  # Type II diabetes mellitus with renal manifestations:  Recent A1c 7.3, poorly controlled for patient taking glipizide at home -SSI 2/18 started Semglee 5 units subcu daily and NovoLog 4 units 3 times daily due to hyperglycemia secondary to steroids. Follow diabetic coordinator and titrate insulin dose accordingly. Monitor CBG and continue diabetic diet   # Essential hypertension: Patient's not taking medications.   Nonsudden V. tach episode 7 beats run at 630 on 2/13 reported 2/13 started metoprolol 12.5 mg p.o. 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 Difficile negative Now patient developed constipation.  # Constipation, started laxatives on 2/19 2/21 started Dulcolax p.o. Use suppository and enema as needed  Body mass index  is 29.98 kg/m.  Interventions:  Diet: Renal diet DVT Prophylaxis: Heparin IV infusion  Advance goals of care discussion: Full code  Family Communication: Discussed with wife on phone.  She need to come up with a plan for disposition.  Per wife she will try talking with him today and let us know  Disposition:  Patient likely needs SNF due to worsening functional status but declining.  A lot of behavioral issues   Subjective: Patient was getting dialysis when seen today.  At time becoming agitated and cursing.  Physical Exam: General.  Frail elderly man, in no acute distress. Pulmonary.  Lungs clear bilaterally, normal respiratory effort. CV.  Regular rate and rhythm, no JVD, rub or murmur. Abdomen.  Soft, nontender, nondistended, BS positive. CNS.  Alert and oriented to self and refused to answer other questions.  No focal neurologic deficit. Extremities.  No edema, no cyanosis, pulses intact and symmetrical.  Vitals:   05/15/23 1200 05/15/23 1215 05/15/23 1222 05/15/23 1239  BP: (!) 73/60 104/74 126/79   Pulse: 60 67 (!) 102   Resp:      Temp:   97.6 F (36.4 C)   TempSrc:   Oral   SpO2: 98% 99% 98%   Weight:    92.1 kg  Height:        Intake/Output Summary (Last 24 hours) at 05/15/2023 1455 Last data filed at 05/15/2023 1222 Gross per  24 hour  Intake --  Output 1600 ml  Net -1600 ml    Filed Weights   05/12/23 1545 05/15/23 0500 05/15/23 1239  Weight: 95.5 kg 93.9 kg 92.1 kg    Data Reviewed: I have personally reviewed and interpreted daily labs, tele strips, imagings as discussed above. I reviewed all nursing notes, pharmacy notes, vitals, pertinent old records I have discussed plan of care as described above with RN and patient/family.  CBC: Recent Labs  Lab 05/10/23 1220 05/11/23 0800 05/12/23 0809 05/13/23 0610 05/15/23 0900  WBC 7.6 6.8 7.5 9.8 13.5*  HGB 8.4* 8.3* 8.7* 9.8* 9.5*  HCT 25.0* 25.0* 26.3* 29.0* 28.9*  MCV 94.3 95.4 95.6 95.4 96.7   PLT 169 172 170 198 231   Basic Metabolic Panel: Recent Labs  Lab 05/09/23 0536 05/10/23 1220 05/11/23 0800 05/12/23 0809 05/13/23 0610 05/15/23 0900  NA 138 138 135 137 136 140  K 4.4 4.4 4.3 5.0 4.1 5.3*  CL 98 99 96* 99 99 100  CO2 19* 21* 22 21* 24 21*  GLUCOSE 181* 179* 175* 234* 151* 106*  BUN 96* 119* 75* 98* 60* 109*  CREATININE 9.05* 10.78* 7.12* 8.58* 5.69* 9.15*  CALCIUM 7.7* 7.6* 7.6* 7.7* 7.9* 8.3*  MG 1.9 2.0 1.8  --   --   --   PHOS 8.5* 8.5* 6.1*  --   --  7.7*    Studies: No results found.   Scheduled Meds:  apixaban  2.5 mg Oral BID   ascorbic acid  250 mg Oral Daily   bisacodyl  10 mg Oral QHS   calcium acetate  1,334 mg Oral TID WC   Chlorhexidine Gluconate Cloth  6 each Topical Daily   clotrimazole-betamethasone   Topical BID   dexamethasone  4 mg Oral Daily   Followed by   Melene Muller ON 05/17/2023] dexamethasone  2 mg Oral Daily   dorzolamide-timolol  1 drop Both Eyes Daily   epoetin alfa-epbx (RETACRIT) injection  4,000 Units Intravenous Q M,W,F-HD   feeding supplement (NEPRO CARB STEADY)  237 mL Oral BID BM   fluticasone furoate-vilanterol  1 puff Inhalation Daily   guaiFENesin  600 mg Oral BID   insulin aspart  0-5 Units Subcutaneous QHS   insulin aspart  0-9 Units Subcutaneous TID WC   insulin aspart  4 Units Subcutaneous TID WC   insulin glargine-yfgn  5 Units Subcutaneous Daily   Ipratropium-Albuterol  1 puff Inhalation Q6H   multivitamin  1 tablet Oral QHS   neomycin-polymyxin b-dexamethasone  1 Application Right Eye TID   pantoprazole  40 mg Oral BID   polyethylene glycol  17 g Oral BID   QUEtiapine  50 mg Oral BID   rosuvastatin  10 mg Oral Once per day on Monday Wednesday Friday   traZODone  50 mg Oral QHS   Continuous Infusions:   PRN Meds: acetaminophen, albuterol, bisacodyl, dextromethorphan-guaiFENesin, haloperidol lactate, hydrALAZINE, HYDROmorphone **OR** HYDROmorphone (DILAUDID) injection, loperamide, nitroGLYCERIN,  ondansetron (ZOFRAN) IV  Time spent: 50 minutes  Author: Arnetha Courser MD [facility] 05/15/2023 2:55 PM  To reach On-call, see care teams to locate the attending and reach out to them via [email or web address]. If 7PM-7AM, please contact night-coverage If you still have difficulty reaching the attending provider, please page the [facility] (Director on Call) for [facility] on amion for assistance.

## 2023-05-15 NOTE — Progress Notes (Signed)
  Received patient in bed to unit.   Informed consent signed and in chart.    TX duration: 3hrs     Bedside tx rm 143 Hand-off given to patient's nurse. No c/o and no distress noted    Access used: R HD Catheter Access issues: none   Total UF removed: 1.6L Medication(s) given: 4000 retacrit Post HD VS: wnl Post HD weight: 92.1kg     Lynann Beaver  [facility]

## 2023-05-15 NOTE — Progress Notes (Signed)
 [facility]  ROUNDING NOTE   Subjective:   Kyle Roberson is a 83 y.o. male with diabetes mellitus type II, hypertension, coronary artery disease, chronic diastolic congestive heart failure, atrial fibrillation, pulmonary hypertension, and historyof subdural hematoma who is admitted for Weakness [R53.1] Fall, initial encounter [W19.XXXA] COVID-19 virus infection [U07.1] COVID-19 [U07.1] Uremia of renal origin [N19]  Patient is known to our practice and receives outpatient dialysis at [facility] on a MWF, supervised by Dr Cherylann Ratel. He received his last treatment on Wednesday.   Update: Patient seen sitting up in bed Preparing for dialysis treatment Remains on room air   Objective:  Vital signs in last 24 hours:  Temp:  [97.7 F (36.5 C)-98 F (36.7 C)] 98 F (36.7 C) (02/22 0917) Pulse Rate:  [58-70] 70 (02/22 1015) Resp:  [18] 18 (02/21 2049) BP: (114-144)/(48-96) 125/70 (02/22 1015) SpO2:  [96 %-100 %] 98 % (02/22 1015) Weight:  [93.9 kg] 93.9 kg (02/22 0500)  Weight change:  Filed Weights   05/12/23 0500 05/12/23 1545 05/15/23 0500  Weight: 95.5 kg 95.5 kg 93.9 kg    Intake/Output: No intake/output data recorded.   Intake/Output this shift:  No intake/output data recorded.  Physical Exam: General: NAD  Head: Normocephalic, atraumatic. Moist oral mucosal membranes  Eyes: Anicteric  Lungs:  Clear to auscultation, room air  Heart: Regular rate and rhythm  Abdomen:  Soft, nontender  Extremities: 1+ peripheral edema.  Neurologic: Alert and oriented, moving all four extremities  Skin: Scaly bilateral lower extremities  Access: Right chest tunneled access    Basic Metabolic Panel: Recent Labs  Lab 05/09/23 0536 05/10/23 1220 05/11/23 0800 05/12/23 0809 05/13/23 0610  NA 138 138 135 137 136  K 4.4 4.4 4.3 5.0 4.1  CL 98 99 96* 99 99  CO2 19* 21* 22 21* 24  GLUCOSE 181* 179* 175* 234* 151*  BUN 96* 119* 75* 98* 60*  CREATININE 9.05*  10.78* 7.12* 8.58* 5.69*  CALCIUM 7.7* 7.6* 7.6* 7.7* 7.9*  MG 1.9 2.0 1.8  --   --   PHOS 8.5* 8.5* 6.1*  --   --     Liver Function Tests: No results for input(s): "AST", "ALT", "ALKPHOS", "BILITOT", "PROT", "ALBUMIN" in the last 168 hours.  No results for input(s): "LIPASE", "AMYLASE" in the last 168 hours.  No results for input(s): "AMMONIA" in the last 168 hours.  CBC: Recent Labs  Lab 05/09/23 0536 05/10/23 1220 05/11/23 0800 05/12/23 0809 05/13/23 0610  WBC 5.0 7.6 6.8 7.5 9.8  HGB 8.5* 8.4* 8.3* 8.7* 9.8*  HCT 25.4* 25.0* 25.0* 26.3* 29.0*  MCV 95.1 94.3 95.4 95.6 95.4  PLT 155 169 172 170 198    Cardiac Enzymes: Recent Labs  Lab 05/09/23 0536  CKTOTAL 75    BNP: Invalid input(s): "POCBNP"  CBG: Recent Labs  Lab 05/13/23 2151 05/14/23 0835 05/14/23 1402 05/14/23 1655 05/15/23 0828  GLUCAP 291* 201* 221* 212* 95    Microbiology: Results for orders placed or performed during the hospital encounter of 05/05/23  Resp panel by RT-PCR (RSV, Flu A&B, Covid) Anterior Nasal Swab     Status: Abnormal   Collection Time: 05/05/23  4:33 PM   Specimen: Anterior Nasal Swab  Result Value Ref Range Status   SARS Coronavirus 2 by RT PCR POSITIVE (A) NEGATIVE Final    Comment: (NOTE) SARS-CoV-2 target nucleic acids are DETECTED.  The SARS-CoV-2 RNA is generally detectable in upper respiratory specimens during the acute phase of infection.  Positive results are indicative of the presence of the identified virus, but do not rule out bacterial infection or co-infection with other pathogens not detected by the test. Clinical correlation with patient history and other diagnostic information is necessary to determine patient infection status. The expected result is Negative.  Fact Sheet for Patients: BloggerCourse.com  Fact Sheet for Healthcare Providers: SeriousBroker.it  This test is not yet approved or cleared  by the [place] FDA and  has been authorized for detection and/or diagnosis of SARS-CoV-2 by FDA under an Emergency Use Authorization (EUA).  This EUA will remain in effect (meaning this test can be used) for the duration of  the COVID-19 declaration under Section 564(b)(1) of the A ct, 21 U.S.C. section 360bbb-3(b)(1), unless the authorization is terminated or revoked sooner.     Influenza A by PCR NEGATIVE NEGATIVE Final   Influenza B by PCR NEGATIVE NEGATIVE Final    Comment: (NOTE) The Xpert Xpress SARS-CoV-2/FLU/RSV plus assay is intended as an aid in the diagnosis of influenza from Nasopharyngeal swab specimens and should not be used as a sole basis for treatment. Nasal washings and aspirates are unacceptable for Xpert Xpress SARS-CoV-2/FLU/RSV testing.  Fact Sheet for Patients: BloggerCourse.com  Fact Sheet for Healthcare Providers: SeriousBroker.it  This test is not yet approved or cleared by the [place] FDA and has been authorized for detection and/or diagnosis of SARS-CoV-2 by FDA under an Emergency Use Authorization (EUA). This EUA will remain in effect (meaning this test can be used) for the duration of the COVID-19 declaration under Section 564(b)(1) of the Act, 21 U.S.C. section 360bbb-3(b)(1), unless the authorization is terminated or revoked.     Resp Syncytial Virus by PCR NEGATIVE NEGATIVE Final    Comment: (NOTE) Fact Sheet for Patients: BloggerCourse.com  Fact Sheet for Healthcare Providers: SeriousBroker.it  This test is not yet approved or cleared by the [place] FDA and has been authorized for detection and/or diagnosis of SARS-CoV-2 by FDA under an Emergency Use Authorization (EUA). This EUA will remain in effect (meaning this test can be used) for the duration of the COVID-19 declaration under Section 564(b)(1) of the Act, 21  U.S.C. section 360bbb-3(b)(1), unless the authorization is terminated or revoked.  Performed at [facility], [address] [place], [place] [address]   C Difficile Quick Screen w PCR reflex     Status: None   Collection Time: 05/05/23  7:35 PM   Specimen: STOOL  Result Value Ref Range Status   C Diff antigen NEGATIVE NEGATIVE Final   C Diff toxin NEGATIVE NEGATIVE Final   C Diff interpretation No C. difficile detected.  Final    Comment: Performed at [facility], [address] [place], [place] [address]    Coagulation Studies: No results for input(s): "LABPROT", "INR" in the last 72 hours.   Urinalysis: No results for input(s): "COLORURINE", "LABSPEC", "PHURINE", "GLUCOSEU", "HGBUR", "BILIRUBINUR", "KETONESUR", "PROTEINUR", "UROBILINOGEN", "NITRITE", "LEUKOCYTESUR" in the last 72 hours.  Invalid input(s): "APPERANCEUR"    Imaging: No results found.    Medications:      apixaban  2.5 mg Oral BID   ascorbic acid  250 mg Oral Daily   bisacodyl  10 mg Oral QHS   calcium acetate  1,334 mg Oral TID WC   Chlorhexidine Gluconate Cloth  6 each Topical Daily   clotrimazole-betamethasone   Topical BID   dexamethasone  4 mg Oral Daily   Followed by   Melene Muller ON 05/17/2023] dexamethasone  2  mg Oral Daily   dorzolamide-timolol  1 drop Both Eyes Daily   epoetin alfa-epbx (RETACRIT) injection  4,000 Units Intravenous Q M,W,F-HD   feeding supplement (NEPRO CARB STEADY)  237 mL Oral BID BM   fluticasone furoate-vilanterol  1 puff Inhalation Daily   guaiFENesin  600 mg Oral BID   insulin aspart  0-5 Units Subcutaneous QHS   insulin aspart  0-9 Units Subcutaneous TID WC   insulin aspart  4 Units Subcutaneous TID WC   insulin glargine-yfgn  5 Units Subcutaneous Daily   Ipratropium-Albuterol  1 puff Inhalation Q6H   multivitamin  1 tablet Oral QHS   neomycin-polymyxin b-dexamethasone  1 Application Right Eye TID   pantoprazole  40 mg Oral BID   polyethylene  glycol  17 g Oral BID   QUEtiapine  50 mg Oral BID   rosuvastatin  10 mg Oral Once per day on Monday Wednesday Friday   traZODone  50 mg Oral QHS   acetaminophen, albuterol, bisacodyl, dextromethorphan-guaiFENesin, haloperidol lactate, hydrALAZINE, HYDROmorphone **OR** HYDROmorphone (DILAUDID) injection, loperamide, nitroGLYCERIN, ondansetron (ZOFRAN) IV  Assessment/ Plan:  Mr. Devyn Sheerin is a 83 y.o.  male with diabetes mellitus type II, hypertension, coronary artery disease, chronic diastolic congestive heart failure, atrial fibrillation, pulmonary hypertension, and historyof subdural hematoma who is admitted for Weakness [R53.1] Fall, initial encounter [W19.XXXA] COVID-19 virus infection [U07.1] COVID-19 [U07.1] Uremia of renal origin [N19]  [facility]/MWF/right chest PermCath  End-stage renal disease on hemodialysis.  Treatment postponed yesterday. Will receive scheduled treatment today. Next treatment scheduled for Monday.  COVID-19, found positive this admission.  Receiving supportive care.  Room air  3. Anemia of chronic kidney disease  Lab Results  Component Value Date   HGB 9.8 (L) 05/13/2023    Hemoglobin has been within desired range.  Continue EPO with dialysis.  Patient receives Mircera at outpatient clinic.   4. Secondary Hyperparathyroidism: with outpatient labs: None available  Lab Results  Component Value Date   PTH 344 (H) 01/07/2023   CALCIUM 7.9 (L) 05/13/2023   CAION 1.14 (L) 12/02/2022   PHOS 6.1 (H) 05/11/2023    Will continue to monitor phosphorus levels.  Continue calcium acetate with meals.   LOS: 8 Jep Dyas 2/22/202510:23 AM

## 2023-05-16 DIAGNOSIS — I25118 Atherosclerotic heart disease of native coronary 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: Secondary | ICD-10-CM | POA: Diagnosis not present

## 2023-05-16 DIAGNOSIS — I480 Paroxysmal atrial fibrillation: Secondary | ICD-10-CM | POA: Diagnosis not present

## 2023-05-16 DIAGNOSIS — U071 COVID-19: Secondary | ICD-10-CM | POA: Diagnosis not present

## 2023-05-16 DIAGNOSIS — E1122 Type 2 diabetes mellitus with diabetic chronic kidney disease: Secondary | ICD-10-CM | POA: Diagnosis not present

## 2023-05-16 LAB — GLUCOSE, CAPILLARY
Glucose-Capillary: 178 mg/dL — ABNORMAL HIGH (ref 70–99)
Glucose-Capillary: 121 mg/dL — ABNORMAL HIGH (ref 70–99)
Glucose-Capillary: 159 mg/dL — ABNORMAL HIGH (ref 70–99)
Glucose-Capillary: 212 mg/dL — ABNORMAL HIGH (ref 70–99)

## 2023-05-16 NOTE — Plan of Care (Signed)
  Problem: Education: Goal: Knowledge of risk factors and measures for prevention of condition will improve Outcome: Progressing   Problem: Nutrition: Goal: Adequate nutrition will be maintained Outcome: Progressing   Problem: Safety: Goal: Ability to remain free from injury will improve Outcome: Progressing

## 2023-05-16 NOTE — Progress Notes (Signed)
 [facility] Progress Note  Patient: Kyle Roberson    ZOX:[id number]  DOA: 05/05/2023     Date of Service: the patient was seen and examined on 05/16/2023  Chief Complaint  Patient presents with   Fall   Brief hospital course:  Arvle Grabe is a 83 y.o. male with medical history significant of ESRD-HD (MWF), HTN, HLD, DM, CAD with stent, dCHF, depression with anxiety, BPH, anemia, A-fib on Eliquis, left eye blindness, who presents with cough, shortness of breath, diarrhea, fall.   Per pt and his wife (I called his wife by phone), patient has productive cough, sore throat, SOB, malaise for more than 5 days.  No chest pain.  No fever or chills.  Patient has diarrhea for more than 10 days, with 2- 3 times of watery diarrhea each day.  No nausea, vomiting or abdominal pain.  Patient fell this morning, no loss of consciousness. He patient states that he was going to the bathroom when he suddenly felt weak in his gave out and fell.  No unilateral numbness or tingling in extremities.  No facial droop or slurred speech.  Patient states that he has missed several dialysis. His last dialysis was on Monday.     Data reviewed independently and ED Course: pt was found to have positive COVID 19, WBC 5.6, troponin 95 --> 105, potassium 4.7, bicarbonate 18, creatinine 10.02, BUN 102.  CT head negative. Chest x-ray showed cardiomegaly with vascular congestion.  Patient is placed on telemetry bed for observation.     EKG: I have personally reviewed.  A-fib, QTc 461, low voltage, poor IV progression, anteroseptal infarction pattern.  2/22: Hemodynamically stable, getting dialysis.  Lengthy discussion with wife on phone to make decision regarding SNF versus home health, patient does not want to go to rehab himself and becoming agitated and combative. Psych was also consulted for capacity evaluation-pending. Currently being managed with as needed Haldol.  2/23; yesterday evening patient was  transferred to stepdown due to softer blood pressure which shows initial poor response to IV fluid.  Later blood pressure started improving with addition of midodrine.  Patient is now at baseline and remained quite combative and rude, refusing most of the care.  Being transferred out of stepdown.  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 nightly and Seroquel 25 mg p.o. twice daily 2/20 psych consulted for further recommendation 2/21 patient was angry and cursing everyone.  Requested psych to eval him for decision-making capacity and further management of his depression and generalized anxiety disorder Started Haldol IV as needed -Pending psych evaluation and recommendation-need capacity evaluation so we can plan disposition.  # COVID-19 virus infection: No fever, no leukocytosis, no oxygen desaturation.   Chest x-ray showed vascular congestion, no infiltration. -Bronchodilators, Mucinex 600 mg p.o. twice daily, Robitussin DM as needed for cough -Continue supportive care 2/15 still sob after HD, may benefit from steroids and breathing treatments S/p Prednisone 40 mg p.o. daily x 3 days, Breo Ellipta inhaler Continue albuterol as needed 2/16 patient is refusing inhalers, started on nebulizer treatments if he agrees. 2/18 started Decadron 6 mg p.o. daily x 4 days til 2/21 2/21 decreased Decadron 4 mg p.o. daily for 2 days followed by 2 mg p.o. daily for 2 days due to mental status changes.  Steroids may be contributing. Patient is refusing inhalers   # CAD (coronary artery disease) and Myocardial injury:  Troponin 894,  but No chest pain and no significant EKG changes Continue to monitor on telemetry S/p ASA 81 mg and heparin IV infusion, discontinued on 2/14, due to GI bleed 2/14 continue to hold Eliquis for now Cardiology consulted, recommended no ischemic workup at this time, follow  as an outpatient for Myoview when recovered after COVID infection TTE shows LVEF 55 to 60%, grade 2 diastolic dysfunction, no wall motion abnormality.  Severe pulmonary hypertension, RA moderately dilated.  Moderate TR As per cardio patient may need right and left heart cath due to pulmonary hypertension and elevated troponin.  # Paroxysmal atrial fibrillation: -Tele monitoring - s/p heparin IV infusion, Hb dropped, suspected GI bleed.  Patient was cleared by GI to continue heparin IV infusion.  2/15 resumed Eliquis 2.5 mg p.o. 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 Current hemoglobin seems stable around 9.5   # ESRD on dialysis -consulted nephrology Continue hemodialysis MWF schedule Hyperphosphatemia, started PhosLo   # Fall at home, initial encounter: CT-head negative -Fall precaution, CK level wnl -PT/OT -recommending SNF but patient is refusing  # Type II diabetes mellitus with renal manifestations:  Recent A1c 7.3, poorly controlled for patient taking glipizide at home -SSI 2/18 started Semglee 5 units subcu daily and NovoLog 4 units 3 times daily due to hyperglycemia secondary to steroids. Follow diabetic coordinator and titrate insulin dose accordingly. Monitor CBG and continue diabetic diet   # Essential hypertension: Patient's not taking medications.   Nonsudden V. tach episode 7 beats run at 630 on 2/13 reported 2/13 started metoprolol 12.5 mg p.o. 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 Difficile negative Now patient developed constipation.  # Constipation, started laxatives on 2/19 2/21 started Dulcolax p.o. Use suppository and enema as needed  Body mass index is 29.76 kg/m.  Interventions:  Diet: Renal diet DVT Prophylaxis: Heparin IV infusion  Advance goals of care discussion: Full code  Family Communication: Discussed with wife on phone.  She need to come up with a plan for disposition.  Per wife she will try talking with him today and let us know  Disposition:  Patient likely needs SNF due to worsening functional status but declining.  A lot of behavioral issues   Subjective: Patient was seen and examined today.  As soon as I started talking he started shouting that get out of my room and I do not need anyone to help me.  I told him that we need to send him to rehab to get stronger as his wife might not be able to provide appropriate care if he is unable to walk, per patient he will talk with her but he does not want to go to rehab.  Physical Exam: General.  Frail and agitated elderly man, in no acute distress. Pulmonary.  Lungs clear bilaterally, normal respiratory effort. CV.  Regular rate and rhythm, no JVD, rub or murmur. Abdomen.  Soft, nontender, nondistended, BS positive. CNS.  Alert and oriented .  No focal neurologic deficit. Extremities.  No edema, no cyanosis, pulses intact and symmetrical.    Vitals:   05/16/23 0800 05/16/23 0900 05/16/23 1000 05/16/23 1144  BP: 130/70 (!) 123/90 (!) 117/52 (!) 131/52  Pulse: (!) 56 (!) 52 (!) 49 (!) 53  Resp: (!) 26 (!) 21 (!) 21 18  Temp: 98.2 F (36.8 C)   98 F (36.7 C)  TempSrc:    Oral  SpO2: 98% 95% 93% 95%  Weight:      Height:        Intake/Output Summary (Last 24 hours) at  05/16/2023 1230 Last data filed at 05/15/2023 1900 Gross per 24 hour  Intake 740.61 ml  Output --  Net 740.61 ml    Filed Weights   05/15/23 0500 05/15/23 1239 05/16/23 0500  Weight: 93.9 kg 92.1 kg 91.4 kg    Data Reviewed: I have personally reviewed and interpreted daily labs, tele strips, imagings as discussed above. I reviewed all nursing notes, pharmacy notes, vitals, pertinent old records I have discussed plan of care as described above with RN and patient/family.  CBC: Recent Labs  Lab 05/10/23 1220 05/11/23 0800 05/12/23 0809 05/13/23 0610 05/15/23 0900  WBC 7.6 6.8 7.5 9.8 13.5*  HGB 8.4* 8.3* 8.7* 9.8* 9.5*  HCT 25.0* 25.0* 26.3* 29.0* 28.9*  MCV 94.3 95.4 95.6 95.4 96.7  PLT 169 172 170 198 231   Basic Metabolic Panel: Recent Labs  Lab 05/10/23 1220 05/11/23 0800 05/12/23 0809 05/13/23 0610 05/15/23 0900  NA 138 135 137 136 140  K 4.4 4.3 5.0 4.1 5.3*  CL 99 96* 99 99 100  CO2 21* 22 21* 24 21*  GLUCOSE 179* 175* 234* 151* 106*  BUN 119* 75* 98* 60* 109*  CREATININE 10.78* 7.12* 8.58* 5.69* 9.15*  CALCIUM 7.6* 7.6* 7.7* 7.9* 8.3*  MG 2.0 1.8  --   --   --   PHOS 8.5* 6.1*  --   --  7.7*    Studies: No results found.   Scheduled Meds:  apixaban  2.5 mg Oral BID   ascorbic acid  250 mg Oral Daily   bisacodyl  10 mg Oral QHS   budesonide (PULMICORT) nebulizer solution  0.5 mg Nebulization BID   calcium acetate  1,334 mg Oral TID WC   Chlorhexidine Gluconate Cloth  6 each Topical Daily   clotrimazole-betamethasone   Topical BID   [START ON 05/17/2023] dexamethasone  2 mg Oral Daily   dorzolamide-timolol  1 drop Both Eyes Daily   epoetin alfa-epbx (RETACRIT) injection  4,000 Units Intravenous Q M,W,F-HD   feeding supplement (NEPRO CARB STEADY)  237 mL Oral BID BM   guaiFENesin  600 mg Oral BID   insulin aspart  0-5 Units Subcutaneous QHS   insulin aspart  0-9 Units Subcutaneous TID WC   insulin aspart  4 Units Subcutaneous TID WC   insulin  glargine-yfgn  5 Units Subcutaneous Daily   ipratropium-albuterol  3 mL Nebulization Q6H   midodrine  10 mg Oral TID WC   multivitamin  1 tablet Oral QHS   neomycin-polymyxin b-dexamethasone  1 Application Right Eye TID   pantoprazole  40 mg Oral BID   polyethylene glycol  17 g Oral BID   QUEtiapine  50 mg Oral BID   rosuvastatin  10 mg Oral Once per day on Monday Wednesday  Friday   traZODone  50 mg Oral QHS   Continuous Infusions:   PRN Meds: acetaminophen, albuterol, bisacodyl, dextromethorphan-guaiFENesin, haloperidol lactate, hydrALAZINE, HYDROmorphone **OR** HYDROmorphone (DILAUDID) injection, loperamide, nitroGLYCERIN, ondansetron (ZOFRAN) IV  Time spent: 45 minutes  Author: Arnetha Courser MD [facility] 05/16/2023 12:30 PM  To reach On-call, see care teams to locate the attending and reach out to them via [email or web address]. If 7PM-7AM, please contact night-coverage If you still have difficulty reaching the attending provider, please page the [facility] (Director on Call) for [facility] on amion for assistance.

## 2023-05-16 NOTE — Progress Notes (Signed)
 Patient attempted to taken night meds. Spit them out after unable to swallow them. Refusing to attempt again. Cliffton Asters NP aware.

## 2023-05-16 NOTE — Plan of Care (Signed)
  Problem: Coping: Goal: Psychosocial and spiritual needs will be supported Outcome: Progressing   Problem: Respiratory: Goal: Will maintain a patent airway Outcome: Progressing Goal: Complications related to the disease process, condition or treatment will be avoided or minimized Outcome: Progressing   Problem: Clinical Measurements: Goal: Ability to maintain clinical measurements within normal limits will improve Outcome: Progressing Goal: Will remain free from infection Outcome: Progressing Goal: Respiratory complications will improve Outcome: Progressing Goal: Cardiovascular complication will be avoided Outcome: Progressing   Problem: Elimination: Goal: Will not experience complications related to bowel motility Outcome: Progressing   Problem: Safety: Goal: Ability to remain free from injury will improve Outcome: Progressing

## 2023-05-16 NOTE — Progress Notes (Signed)
 Report to floor

## 2023-05-16 NOTE — Progress Notes (Signed)
 [facility]  ROUNDING NOTE   Subjective:   Kyle Roberson is a 83 y.o. male with diabetes mellitus type II, hypertension, coronary artery disease, chronic diastolic congestive heart failure, atrial fibrillation, pulmonary hypertension, and historyof subdural hematoma who is admitted for Weakness [R53.1] Fall, initial encounter [W19.XXXA] COVID-19 virus infection [U07.1] COVID-19 [U07.1] Uremia of renal origin [N19]  Patient is known to our practice and receives outpatient dialysis at [facility] on a MWF, supervised by Dr Cherylann Ratel. He received his last treatment on Wednesday.   Update: Patient seen in ICU Sitting up eating breakfast Denies pain Room air  Dialysis with 1.6L removed   Objective:  Vital signs in last 24 hours:  Temp:  [97.6 F (36.4 C)-98.4 F (36.9 C)] 98 F (36.7 C) (02/23 0200) Pulse Rate:  [37-102] 47 (02/23 0700) Resp:  [15-27] 20 (02/23 0700) BP: (73-144)/(32-96) 115/48 (02/23 0700) SpO2:  [92 %-100 %] 97 % (02/23 0700) Weight:  [91.4 kg-92.1 kg] 91.4 kg (02/23 0500)  Weight change: -1.8 kg Filed Weights   05/15/23 0500 05/15/23 1239 05/16/23 0500  Weight: 93.9 kg 92.1 kg 91.4 kg    Intake/Output: I/O last 3 completed shifts: In: 740.6 [IV Piggyback:740.6] Out: 1600 [Other:1600]   Intake/Output this shift:  No intake/output data recorded.  Physical Exam: General: NAD  Head: Normocephalic, atraumatic. Moist oral mucosal membranes  Eyes: Anicteric  Lungs:  Clear to auscultation, room air  Heart: Regular rate and rhythm  Abdomen:  Soft, nontender  Extremities: 1+ peripheral edema.  Neurologic: Alert and oriented, moving all four extremities  Skin: Scaly bilateral lower extremities  Access: Right chest tunneled access    Basic Metabolic Panel: Recent Labs  Lab 05/10/23 1220 05/11/23 0800 05/12/23 0809 05/13/23 0610 05/15/23 0900  NA 138 135 137 136 140  K 4.4 4.3 5.0 4.1 5.3*  CL 99 96* 99 99 100  CO2 21* 22 21*  24 21*  GLUCOSE 179* 175* 234* 151* 106*  BUN 119* 75* 98* 60* 109*  CREATININE 10.78* 7.12* 8.58* 5.69* 9.15*  CALCIUM 7.6* 7.6* 7.7* 7.9* 8.3*  MG 2.0 1.8  --   --   --   PHOS 8.5* 6.1*  --   --  7.7*    Liver Function Tests: Recent Labs  Lab 05/15/23 0900  ALBUMIN 2.6*    No results for input(s): "LIPASE", "AMYLASE" in the last 168 hours.  No results for input(s): "AMMONIA" in the last 168 hours.  CBC: Recent Labs  Lab 05/10/23 1220 05/11/23 0800 05/12/23 0809 05/13/23 0610 05/15/23 0900  WBC 7.6 6.8 7.5 9.8 13.5*  HGB 8.4* 8.3* 8.7* 9.8* 9.5*  HCT 25.0* 25.0* 26.3* 29.0* 28.9*  MCV 94.3 95.4 95.6 95.4 96.7  PLT 169 172 170 198 231    Cardiac Enzymes: No results for input(s): "CKTOTAL", "CKMB", "CKMBINDEX", "TROPONINI" in the last 168 hours.   BNP: Invalid input(s): "POCBNP"  CBG: Recent Labs  Lab 05/14/23 1655 05/15/23 0828 05/15/23 1245 05/15/23 1616 05/16/23 0755  GLUCAP 212* 95 95 121* 178*    Microbiology: Results for orders placed or performed during the hospital encounter of 05/05/23  Resp panel by RT-PCR (RSV, Flu A&B, Covid) Anterior Nasal Swab     Status: Abnormal   Collection Time: 05/05/23  4:33 PM   Specimen: Anterior Nasal Swab  Result Value Ref Range Status   SARS Coronavirus 2 by RT PCR POSITIVE (A) NEGATIVE Final    Comment: (NOTE) SARS-CoV-2 target nucleic acids are DETECTED.  The SARS-CoV-2  RNA is generally detectable in upper respiratory specimens during the acute phase of infection. Positive results are indicative of the presence of the identified virus, but do not rule out bacterial infection or co-infection with other pathogens not detected by the test. Clinical correlation with patient history and other diagnostic information is necessary to determine patient infection status. The expected result is Negative.  Fact Sheet for Patients: BloggerCourse.com  Fact Sheet for Healthcare  Providers: SeriousBroker.it  This test is not yet approved or cleared by the [place] FDA and  has been authorized for detection and/or diagnosis of SARS-CoV-2 by FDA under an Emergency Use Authorization (EUA).  This EUA will remain in effect (meaning this test can be used) for the duration of  the COVID-19 declaration under Section 564(b)(1) of the A ct, 21 U.S.C. section 360bbb-3(b)(1), unless the authorization is terminated or revoked sooner.     Influenza A by PCR NEGATIVE NEGATIVE Final   Influenza B by PCR NEGATIVE NEGATIVE Final    Comment: (NOTE) The Xpert Xpress SARS-CoV-2/FLU/RSV plus assay is intended as an aid in the diagnosis of influenza from Nasopharyngeal swab specimens and should not be used as a sole basis for treatment. Nasal washings and aspirates are unacceptable for Xpert Xpress SARS-CoV-2/FLU/RSV testing.  Fact Sheet for Patients: BloggerCourse.com  Fact Sheet for Healthcare Providers: SeriousBroker.it  This test is not yet approved or cleared by the [place] FDA and has been authorized for detection and/or diagnosis of SARS-CoV-2 by FDA under an Emergency Use Authorization (EUA). This EUA will remain in effect (meaning this test can be used) for the duration of the COVID-19 declaration under Section 564(b)(1) of the Act, 21 U.S.C. section 360bbb-3(b)(1), unless the authorization is terminated or revoked.     Resp Syncytial Virus by PCR NEGATIVE NEGATIVE Final    Comment: (NOTE) Fact Sheet for Patients: BloggerCourse.com  Fact Sheet for Healthcare Providers: SeriousBroker.it  This test is not yet approved or cleared by the [place] FDA and has been authorized for detection and/or diagnosis of SARS-CoV-2 by FDA under an Emergency Use Authorization (EUA). This EUA will remain in effect (meaning this test can be  used) for the duration of the COVID-19 declaration under Section 564(b)(1) of the Act, 21 U.S.C. section 360bbb-3(b)(1), unless the authorization is terminated or revoked.  Performed at [facility], [address] [place], [place] [address]   C Difficile Quick Screen w PCR reflex     Status: None   Collection Time: 05/05/23  7:35 PM   Specimen: STOOL  Result Value Ref Range Status   C Diff antigen NEGATIVE NEGATIVE Final   C Diff toxin NEGATIVE NEGATIVE Final   C Diff interpretation No C. difficile detected.  Final    Comment: Performed at [facility], [address] [place], [place] [address]  MRSA Next Gen by PCR, Nasal     Status: None   Collection Time: 05/15/23  6:37 PM   Specimen: Nasal Mucosa; Nasal Swab  Result Value Ref Range Status   MRSA by PCR Next Gen NOT DETECTED NOT DETECTED Final    Comment: (NOTE) The GeneXpert MRSA Assay (FDA approved for NASAL specimens only), is one component of a comprehensive MRSA colonization surveillance program. It is not intended to diagnose MRSA infection nor to guide or monitor treatment for MRSA infections. Test performance is not FDA approved in patients less than 18 years old. Performed at [facility], [address] [place], [place] [address]  Coagulation Studies: No results for input(s): "LABPROT", "INR" in the last 72 hours.   Urinalysis: No results for input(s): "COLORURINE", "LABSPEC", "PHURINE", "GLUCOSEU", "HGBUR", "BILIRUBINUR", "KETONESUR", "PROTEINUR", "UROBILINOGEN", "NITRITE", "LEUKOCYTESUR" in the last 72 hours.  Invalid input(s): "APPERANCEUR"    Imaging: No results found.    Medications:      apixaban  2.5 mg Oral BID   ascorbic acid  250 mg Oral Daily   bisacodyl  10 mg Oral QHS   budesonide (PULMICORT) nebulizer solution  0.5 mg Nebulization BID   calcium acetate  1,334 mg Oral TID WC   Chlorhexidine Gluconate Cloth  6 each Topical Daily    clotrimazole-betamethasone   Topical BID   dexamethasone  4 mg Oral Daily   Followed by   Melene Muller ON 05/17/2023] dexamethasone  2 mg Oral Daily   dorzolamide-timolol  1 drop Both Eyes Daily   epoetin alfa-epbx (RETACRIT) injection  4,000 Units Intravenous Q M,W,F-HD   feeding supplement (NEPRO CARB STEADY)  237 mL Oral BID BM   guaiFENesin  600 mg Oral BID   insulin aspart  0-5 Units Subcutaneous QHS   insulin aspart  0-9 Units Subcutaneous TID WC   insulin aspart  4 Units Subcutaneous TID WC   insulin glargine-yfgn  5 Units Subcutaneous Daily   ipratropium-albuterol  3 mL Nebulization Q6H   midodrine  10 mg Oral TID WC   multivitamin  1 tablet Oral QHS   neomycin-polymyxin b-dexamethasone  1 Application Right Eye TID   pantoprazole  40 mg Oral BID   polyethylene glycol  17 g Oral BID   QUEtiapine  50 mg Oral BID   rosuvastatin  10 mg Oral Once per day on Monday Wednesday Friday   traZODone  50 mg Oral QHS   acetaminophen, albuterol, bisacodyl, dextromethorphan-guaiFENesin, haloperidol lactate, hydrALAZINE, HYDROmorphone **OR** HYDROmorphone (DILAUDID) injection, loperamide, nitroGLYCERIN, ondansetron (ZOFRAN) IV  Assessment/ Plan:  [name] is a 83 y.o.  male with diabetes mellitus type II, hypertension, coronary artery disease, chronic diastolic congestive heart failure, atrial fibrillation, pulmonary hypertension, and historyof subdural hematoma who is admitted for Weakness [R53.1] Fall, initial encounter [W19.XXXA] COVID-19 virus infection [U07.1] COVID-19 [U07.1] Uremia of renal origin [N19]  [facility]/MWF/right chest PermCath  End-stage renal disease on hemodialysis.  Received dialysis yesterday, UF 1.6L achieved. Next treatment scheduled for Monday, seated in chair.  COVID-19, found positive this admission.  Isolation discontinued on 05/15/23. Room air  3. Anemia of chronic kidney disease  Lab Results  Component Value Date   HGB 9.5 (L) 05/15/2023     Hemoglobin has been within desired range.  Continue EPO with dialysis.  Patient receives Mircera at outpatient clinic.   4. Secondary Hyperparathyroidism: with outpatient labs: None available  Lab Results  Component Value Date   PTH 344 (H) 01/07/2023   CALCIUM 8.3 (L) 05/15/2023   CAION 1.14 (L) 12/02/2022   PHOS 7.7 (H) 05/15/2023    Will continue to monitor bone minerals during this admission. Continue binders as ordered.    LOS: 9 Virdie Penning 2/23/20259:17 AM

## 2023-05-16 NOTE — Consult Note (Signed)
 An attempt was made to see the patient today, patient refused to talk to a psychiatrist.  Please call back if the patient changes his mind, and agrees to talk to a psychitrist

## 2023-05-17 DIAGNOSIS — F4329 Adjustment disorder with other symptoms: Secondary | ICD-10-CM | POA: Diagnosis not present

## 2023-05-17 DIAGNOSIS — E1122 Type 2 diabetes mellitus with diabetic chronic kidney disease: Secondary | ICD-10-CM | POA: Diagnosis not present

## 2023-05-17 DIAGNOSIS — I480 Paroxysmal atrial fibrillation: Secondary | ICD-10-CM | POA: Diagnosis not present

## 2023-05-17 DIAGNOSIS — I25118 Atherosclerotic heart disease of native coronary 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: Secondary | ICD-10-CM | POA: Diagnosis not present

## 2023-05-17 DIAGNOSIS — U071 COVID-19: Secondary | ICD-10-CM | POA: Diagnosis not present

## 2023-05-17 LAB — RENAL FUNCTION PANEL
Albumin: 2.4 g/dL — ABNORMAL LOW (ref 3.5–5.0)
Anion gap: 18 — ABNORMAL HIGH (ref 5–15)
BUN: 96 mg/dL — ABNORMAL HIGH (ref 8–23)
CO2: 21 mmol/L — ABNORMAL LOW (ref 22–32)
Calcium: 7.6 mg/dL — ABNORMAL LOW (ref 8.9–10.3)
Chloride: 99 mmol/L (ref 98–111)
Creatinine, Ser: 7.49 mg/dL — ABNORMAL HIGH (ref 0.61–1.24)
GFR, Estimated: 7 mL/min — ABNORMAL LOW (ref >60)
Glucose, Bld: 222 mg/dL — ABNORMAL HIGH (ref 70–99)
Phosphorus: 7.4 mg/dL — ABNORMAL HIGH (ref 2.5–4.6)
Potassium: 5 mmol/L (ref 3.5–5.1)
Sodium: 138 mmol/L (ref 135–145)

## 2023-05-17 LAB — CBC WITH DIFFERENTIAL/PLATELET
Abs Immature Granulocytes: 0.12 10*3/uL — ABNORMAL HIGH (ref 0.00–0.07)
Basophils Absolute: 0 10*3/uL (ref 0.0–0.1)
Basophils Relative: 0 %
Eosinophils Absolute: 0 10*3/uL (ref 0.0–0.5)
Eosinophils Relative: 0 %
HCT: 27.9 % — ABNORMAL LOW (ref 39.0–52.0)
Hemoglobin: 9.1 g/dL — ABNORMAL LOW (ref 13.0–17.0)
Immature Granulocytes: 1 %
Lymphocytes Relative: 5 %
Lymphs Abs: 0.5 10*3/uL — ABNORMAL LOW (ref 0.7–4.0)
MCH: 31.7 pg (ref 26.0–34.0)
MCHC: 32.6 g/dL (ref 30.0–36.0)
MCV: 97.2 fL (ref 80.0–100.0)
Monocytes Absolute: 0.7 10*3/uL (ref 0.1–1.0)
Monocytes Relative: 7 %
Neutro Abs: 9.1 10*3/uL — ABNORMAL HIGH (ref 1.7–7.7)
Neutrophils Relative %: 87 %
Platelets: 199 10*3/uL (ref 150–400)
RBC: 2.87 MIL/uL — ABNORMAL LOW (ref 4.22–5.81)
RDW: 13.8 % (ref 11.5–15.5)
WBC: 10.6 10*3/uL — ABNORMAL HIGH (ref 4.0–10.5)
nRBC: 0 % (ref 0.0–0.2)

## 2023-05-17 LAB — GLUCOSE, CAPILLARY
Glucose-Capillary: 130 mg/dL — ABNORMAL HIGH (ref 70–99)
Glucose-Capillary: 216 mg/dL — ABNORMAL HIGH (ref 70–99)
Glucose-Capillary: 257 mg/dL — ABNORMAL HIGH (ref 70–99)

## 2023-05-17 MED ORDER — EPOETIN ALFA-EPBX 4000 UNIT/ML IJ SOLN
INTRAMUSCULAR | Status: AC
  Filled 2023-05-17: qty 1

## 2023-05-17 MED ORDER — HEPARIN SODIUM (PORCINE) 1000 UNIT/ML IJ SOLN
INTRAMUSCULAR | Status: AC
Start: 2023-05-17 — End: ?
  Filled 2023-05-17: qty 10

## 2023-05-17 MED ORDER — HEPARIN SODIUM (PORCINE) 1000 UNIT/ML IJ SOLN
1000.0000 [IU] | INTRAMUSCULAR | Status: DC | PRN
  Administered 2023-05-17: 1000 [IU]

## 2023-05-17 MED ORDER — IPRATROPIUM-ALBUTEROL 0.5-2.5 (3) MG/3ML IN SOLN
3.0000 mL | Freq: Two times a day (BID) | RESPIRATORY_TRACT | Status: DC
  Administered 2023-05-18 – 2023-05-24 (×10): 3 mL via RESPIRATORY_TRACT
  Filled 2023-05-17 (×12): qty 3

## 2023-05-17 NOTE — Consult Note (Addendum)
 Kyle Roberson Initial  Patient Name: .Kyle Roberson  MRN: [id number]  DOB: 1940/06/24  Roberson Order details:  Orders (From admission, onward)     Start     Ordered   05/16/23 0911  IP Roberson TO PSYCHIATRY       Ordering Provider: Arnetha Courser, MD  Provider:  (Not yet assigned)  Question Answer Comment  Location [facility]   Reason for Roberson? Depression and anxiety, need capacity evaluation      05/16/23 0911             Mode of Visit: In person    Psychiatry Roberson Evaluation  Service Date: May 17, 2023 LOS:  LOS: 10 days  Chief Complaint capacity evaluation  Primary Psychiatric Diagnoses  Adjustment disorder with emotional disturbance 2.  Rule out delirium due to multiple medical etiologies 3.    Assessment  Kyle Roberson is a 83 y.o. male admitted: Medicallyfor 05/05/2023  3:06 PM  with medical history significant of ESRD-HD (MWF), HTN, HLD, DM, CAD with stent, dCHF, depression with anxiety, BPH, anemia, A-fib on Eliquis, left eye blindness, who presents with cough, shortness of breath, diarrhea, fall. pt was found to have positive COVID 19, WBC 5.6, troponin 95 --> 105, potassium 4.7, bicarbonate 18, creatinine 10.02, BUN 102. CT head negative. Chest x-ray showed cardiomegaly with vascular congestion. Patient is placed on telemetry bed for observation.  The care team was consulted for capacity evaluation as patient consistently refusing to participate in any treatment.  On evaluation patient is unable to engage in a meaningful interview, in spite of explaining the reason for the assessment patient continued to remain irritable, unable to describe his medical problems, unable to talk about the treatment options that the doctor has discussed, unable to discuss pros and cons of not engaging in treatment.  Patient is not displaying capacity at this time to make medical decisions.  Capacity is not competency and capacity  migrated depending on his mental status improvement with treatment.  Discussed with his wife who agreed to help the team and decision making for treatments.  Diagnoses:  Active Hospital problems: Principal Problem:   COVID-19 Active Problems:   Essential hypertension   CAD (coronary artery disease)   Paroxysmal atrial fibrillation (HCC)   Dyslipidemia   ESRD on dialysis (HCC)   Weakness   Myocardial injury   Type II diabetes mellitus with renal manifestations (HCC)   Diarrhea   Fall   Obesity (BMI 30-39.9)   Demand ischemia (HCC)   Uremia of renal origin   Acute delirium    Plan   ## Psychiatric Medication Recommendations:  Hold off Haldol as as needed given his QTc prolongation.  Will recommend   Will recommend to monitor EKG very closely for any QTc prolongation and monitor for EPS To minimize exposure to cardiotoxic medications and medications that can cause QTc prolongation.  Unfortunately at this point we will recommend usage of Ativan 0.5 mg every 8 hours as needed for severe agitation inspite of being a geriatric patient as he has no history of dementia at baseline and should be able to tolerate benzos in the situation of inability to use any antipsychotics at higher doses.  ## Medical Decision Making Capacity:  Patient is unable to make decisions on his medical treatment as is unable to discuss his medical problems, unable to discuss the treatment options, unable to discuss the pros and cons of refusing treatmen  ## Further Work-up:  --  EKG --  most recent EKG on 05/17/2023 had QtC of 542 -- Pertinent labwork reviewed earlier this admission includes: Repeat EKGs frequently   ## Disposition:-- There are no psychiatric contraindications to discharge at this time  ## Behavioral / Environmental: -Delirium Precautions: Delirium Interventions for Nursing and Staff: - RN to open blinds every AM. - To Bedside: Glasses, hearing aide, and pt's own shoes. Make available to  patients. when possible and encourage use. - Encourage po fluids when appropriate, keep fluids within reach. - OOB to chair with meals. - Passive ROM exercises to all extremities with AM & PM care. - RN to assess orientation to person, time and place QAM and PRN. - Recommend extended visitation hours with familiar family/friends as feasible. - Staff to minimize disturbances at night. Turn off television when pt asleep or when not in use.    ## Safety and Observation Level:  - Based on my clinical evaluation, I estimate the patient to be at no risk of self harm in the current setting. - At this time, we recommend  routine. This decision is based on my review of the chart including patient's history and current presentation, interview of the patient, mental status examination, and consideration of suicide risk including evaluating suicidal ideation, plan, intent, suicidal or self-harm behaviors, risk factors, and protective factors. This judgment is based on our ability to directly address suicide risk, implement suicide prevention strategies, and develop a safety plan while the patient is in the clinical setting. Please contact our team if there is a concern that risk level has changed.  CSSR Risk Category:C-SSRS RISK CATEGORY: No Risk  Suicide Risk Assessment: Patient has following modifiable risk factors for suicide: none. Patient has following non-modifiable or demographic risk factors for suicide: male gender Patient has the following protective factors against suicide: Supportive family, Cultural, spiritual, or religious beliefs that discourage suicide, and no history of suicide attempts  Thank you for this Roberson request. Recommendations have been communicated to the primary team.  We will sign off at this time.   Verner Chol, MD       History of Present Illness  Kyle Roberson is a 83 y.o. male admitted: Medicallyfor 05/05/2023  3:06 PM  with medical history significant of ESRD-HD  (MWF), HTN, HLD, DM, CAD with stent, dCHF, depression with anxiety, BPH, anemia, A-fib on Eliquis, left eye blindness, who presents with cough, shortness of breath, diarrhea, fall. pt was found to have positive COVID 19, WBC 5.6, troponin 95 --> 105, potassium 4.7, bicarbonate 18, creatinine 10.02, BUN 102. CT head negative. Chest x-ray showed cardiomegaly with vascular congestion. Patient is placed on telemetry bed for observation.  The care team was consulted for capacity evaluation as patient consistently refusing to participate in any treatment.  On evaluation patient is unable to engage in a meaningful interview, in spite of explaining the reason for the assessment.  Patient remained irritable and unable to answer any of the orientation questions.  When asked about any problems with his heart he said I do not know, asked about any problems with his kidney he said I do not know.  When provider prompted if he gets dialysis on regular basis he responded "I do not now, leave me alone ".  Provider explained that it is very important for him to engage with his providers so that they know how to help him, patient continues to be irritable and refuses to participate in the interview.  He was able to tell the provider that he got  COVID and thus why he is in the hospital but unable to give any other medical history.  Interview was terminated as patient became very agitated.  Patient continued to remain irritable, unable to describe his medical problems, unable to talk about the treatment options that the doctor has discussed, unable to discuss pros and cons of not engaging in treatment.  Patient is not displaying capacity at this time to make medical decisions.  Collateral information:  Contacted wife Jules Schick [phone] who informed that patient has no prior psychiatric history no prior family psychiatric history, no diagnosis of dementia and remained functional age appropriately at home before he got COVID.  She  reports that the patient is able to talk to her appropriately today and she feels she is improving.  Provider explained to her that patient irritability and inability to participate in the interview with the physicians is impending the treatment and she did acknowledge that she will be the surrogate decision maker until patient gets better.  ROS   Psychiatric and Social History  Psychiatric History:  Information collected from wife  Prev Dx/Sx: None reported Current Psych Provider: None reported Home Meds (current): Seroquel 50 mg twice daily, trazodone Previous Med Trials: None reported Therapy: None reported  Prior Psych Hospitalization: None reported Prior Self Harm: None reported Prior Violence: None reported  Family Psych History: None reported Family Hx suicide: None reported  Social History:  Developmental Hx: Normal Educational Hx: Some college Occupational Hx: Retired Armed forces operational officer Hx: None reported Living Situation: With wife Spiritual Hx: None reported Access to weapons/lethal means: None reported  Substance History Alcohol: None reported Type of alcohol none reported Last Drink denies Number of drinks per day denies History of alcohol withdrawal seizures denies History of DT's denies Tobacco: None reported Illicit drugs: Denies Prescription drug abuse: Denies Rehab hx: Denies  Exam Findings  Physical Exam: Reviewed and agree with the physical exam findings conducted by the medical provider Vital Signs:  Temp:  [97.5 F (36.4 C)-98.5 F (36.9 C)] 98.5 F (36.9 C) (02/24 1346) Pulse Rate:  [46-69] 68 (02/24 1346) Resp:  [16-29] 20 (02/24 1346) BP: (105-159)/(48-78) 159/72 (02/24 1346) SpO2:  [91 %-99 %] 99 % (02/24 1346) Weight:  [90.1 kg-90.9 kg] 90.1 kg (02/24 1450) Blood pressure (!) 159/72, pulse 68, temperature 98.5 F (36.9 C), temperature source Oral, resp. rate 20, height 5\' 9"  (1.753 m), weight 90.1 kg, SpO2 99%. Body mass index is 29.33  kg/m.    Mental Status Exam: General Appearance: Disheveled  Orientation:  Negative  Memory:  Immediate;   Poor Recent;   Poor Remote;   Poor  Concentration:  Concentration: Poor and Attention Span: Poor  Recall:  Fair  Attention  Poor  Eye Contact:  Poor  Speech:  Slurred  Language:  Poor  Volume:  Decreased  Mood: go away  Affect:  Labile  Thought Process:  Disorganized  Thought Content:  Illogical  Suicidal Thoughts:  No  Homicidal Thoughts:  No  Judgement:  Poor  Insight:  Lacking  Psychomotor Activity:  Decreased  Akathisia:  No  Fund of Knowledge:  Poor      Assets:  Housing Social Support  Cognition:  Impaired,  Moderate  ADL's:  Impaired  AIMS (if indicated):        Other History   These have been pulled in through the EMR, reviewed, and updated if appropriate.  Family History:  The patient's family history includes CAD in his father; Diabetes in his brother;  Pancreatic cancer in his mother.  Medical History: Past Medical History:  Diagnosis Date   AKI (acute kidney injury) (HCC) 07/06/2018   CAD (coronary artery disease)    a. Remote PCI/stenting to LAD w PTCA Diagnoal. RCA 60%; b.  2005/2008 Cardiolites w/ reportedly mild ischemia in Diag territory-->Med rx.   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 04/2019 Echo: EF 55-60%, no rwma, nl RV fxn, RVSP 55.75mmHg. Mod dil LA. Mild-mod MR. Mod dil PA.   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 04/2019 Echo: RVSP 55.64mmHg.   Permanent atrial fibrillation (HCC)    a. CHA2DS2VASc = 5-->dose adjusted eliquis (age/creat).   Subdural hematoma (HCC)    a. 01/2021 in setting of fall s/p L frontotemporal craniotomy.     Surgical History: Past Surgical History:  Procedure Laterality Date   BLEPHAROPLASTY     CARDIAC CATHETERIZATION     CORONARY STENT INTERVENTION     CRANIOTOMY Left 02/04/2021   Procedure: CRANIOTOMY FOR LEFT SUBDURAL  HEMATOMA EVACUATION;  Surgeon: Lucy Chris, MD;  Location: [facility] ORS;  Service: Neurosurgery;  Laterality: Left;   CYSTOSCOPY     DIALYSIS/PERMA CATHETER INSERTION N/A 12/14/2022   Procedure: DIALYSIS/PERMA CATHETER INSERTION;  Surgeon: Annice Needy, MD;  Location: [facility] INVASIVE CV LAB;  Service: Cardiovascular;  Laterality: N/A;   DIALYSIS/PERMA CATHETER REPAIR N/A 12/16/2022   Procedure: DIALYSIS/PERMA CATHETER REPAIR;  Surgeon: Annice Needy, MD;  Location: [facility] INVASIVE CV LAB;  Service: Cardiovascular;  Laterality: N/A;   HERNIA REPAIR     RIGHT HEART CATH N/A 12/02/2022   Procedure: RIGHT HEART CATH;  Surgeon: Laurey Morale, MD;  Location: [facility]IVE CV LAB;  Service: Cardiovascular;  Laterality: N/A;     Medications:   Current Facility-Administered Medications:    acetaminophen (TYLENOL) tablet 650 mg, 650 mg, Oral, Q6H PRN, Gillis Santa, MD, 650 mg at 05/16/23 2109   albuterol (PROVENTIL) (2.5 MG/3ML) 0.083% nebulizer solution 2.5 mg, 2.5 mg, Nebulization, Q4H PRN, Manuela Schwartz, NP   apixaban Everlene Balls) tablet 2.5 mg, 2.5 mg, Oral, BID, Gollan, Tollie Pizza, MD, 2.5 mg at 05/17/23 1444   ascorbic acid (VITAMIN C) tablet 250 mg, 250 mg, Oral, Daily, Lorretta Harp, MD, 250 mg at 05/17/23 1444   bisacodyl (DULCOLAX) EC tablet 10 mg, 10 mg, Oral, QHS, Gillis Santa, MD, 10 mg at 05/16/23 2109   bisacodyl (DULCOLAX) suppository 10 mg, 10 mg, Rectal, Daily PRN, Gillis Santa, MD   budesonide (PULMICORT) nebulizer solution 0.5 mg, 0.5 mg, Nebulization, BID, Manuela Schwartz, NP, 0.5 mg at 05/17/23 0805   calcium acetate (PHOSLO) capsule 1,334 mg, 1,334 mg, Oral, TID WC, Breeze, Shantelle, NP, 1,334 mg at 05/17/23 1443   Chlorhexidine Gluconate Cloth 2 % PADS 6  each, 6 each, Topical, Daily, Gillis Santa, MD, 6 each at 05/17/23 1446   clotrimazole-betamethasone (LOTRISONE) cream, , Topical, BID, Gillis Santa, MD, 1 Application at 05/17/23 1447   [COMPLETED] dexamethasone (DECADRON) tablet 4 mg, 4 mg, Oral, Daily, 4 mg at 05/16/23 1009 **FOLLOWED BY** dexamethasone (DECADRON) tablet 2 mg, 2 mg, Oral, Daily, Gillis Santa, MD, 2 mg at 05/17/23 1445   dextromethorphan-guaiFENesin (MUCINEX DM) 30-600 MG per 12 hr  tablet 1 tablet, 1 tablet, Oral, BID PRN, Lorretta Harp, MD, 1 tablet at 05/16/23 2109   dorzolamide-timolol (COSOPT) 2-0.5 % ophthalmic solution 1 drop, 1 drop, Both Eyes, Daily, Lorretta Harp, MD, 1 drop at 05/17/23 1448   epoetin alfa-epbx (RETACRIT) injection 4,000 Units, 4,000 Units, Intravenous, Q M,W,F-HD, Wendee Beavers, NP, 4,000 Units at 05/17/23 1148   feeding supplement (NEPRO CARB STEADY) liquid 237 mL, 237 mL, Oral, BID BM, Breeze, Shantelle, NP, 237 mL at 05/17/23 1447   guaiFENesin (MUCINEX) 12 hr tablet 600 mg, 600 mg, Oral, BID, Gillis Santa, MD, 600 mg at 05/17/23 1444   haloperidol lactate (HALDOL) injection 2 mg, 2 mg, Intravenous, Q6H PRN, Gillis Santa, MD, 2 mg at 05/14/23 2236   heparin sodium (porcine) injection 1,000 Units, 1,000 Units, Intracatheter, Continuous PRN, Wendee Beavers, NP, 1,000 Units at 05/17/23 1306   hydrALAZINE (APRESOLINE) injection 5 mg, 5 mg, Intravenous, Q2H PRN, Lorretta Harp, MD   HYDROmorphone (DILAUDID) tablet 2 mg, 2 mg, Oral, Q6H PRN **OR** HYDROmorphone (DILAUDID) injection 1 mg, 1 mg, Intravenous, Q4H PRN, Gillis Santa, MD   insulin aspart (novoLOG) injection 0-5 Units, 0-5 Units, Subcutaneous, QHS, Niu, Brien Few, MD, 2 Units at 05/16/23 2110   insulin aspart (novoLOG) injection 0-9 Units, 0-9 Units, Subcutaneous, TID WC, Lorretta Harp, MD, 1 Units at 05/17/23 1448   insulin aspart (novoLOG) injection 4 Units, 4 Units, Subcutaneous, TID WC, Gillis Santa, MD, 4 Units at 05/17/23 1448   insulin  glargine-yfgn (SEMGLEE) injection 5 Units, 5 Units, Subcutaneous, Daily, Gillis Santa, MD, 5 Units at 05/17/23 1445   ipratropium-albuterol (DUONEB) 0.5-2.5 (3) MG/3ML nebulizer solution 3 mL, 3 mL, Nebulization, Q6H, Manuela Schwartz, NP, 3 mL at 05/17/23 0805   loperamide (IMODIUM) capsule 2 mg, 2 mg, Oral, BID PRN, Lorretta Harp, MD, 2 mg at 05/06/23 1610   midodrine (PROAMATINE) tablet 10 mg, 10 mg, Oral, TID WC, Arnetha Courser, MD, 10 mg at 05/17/23 1443   multivitamin (RENA-VIT) tablet 1 tablet, 1 tablet, Oral, QHS, Lorretta Harp, MD, 1 tablet at 05/16/23 2108   neomycin-polymyxin b-dexamethasone (MAXITROL) ophthalmic ointment 1 Application, 1 Application, Right Eye, TID, Lorretta Harp, MD, 1 Application at 05/16/23 2123   nitroGLYCERIN (NITROLINGUAL) 0.4 MG/SPRAY spray 1 spray, 1 spray, Sublingual, PRN, Lorretta Harp, MD   ondansetron [facility]) injection 4 mg, 4 mg, Intravenous, Q8H PRN, Lorretta Harp, MD, 4 mg at 05/10/23 2152   pantoprazole (PROTONIX) EC tablet 40 mg, 40 mg, Oral, BID, Orson Aloe, RPH, 40 mg at 05/17/23 1444   polyethylene glycol (MIRALAX / GLYCOLAX) packet 17 g, 17 g, Oral, BID, Gillis Santa, MD, 17 g at 05/17/23 1443   QUEtiapine (SEROQUEL) tablet 50 mg, 50 mg, Oral, BID, Gillis Santa, MD, 50 mg at 05/17/23 1444   rosuvastatin (CRESTOR) tablet 10 mg, 10 mg, Oral, Once per day on Monday Wednesday Friday, Clyde Lundborg, Brien Few, MD, 10 mg at 05/17/23 1444   traZODone (DESYREL) tablet 50 mg, 50 mg, Oral, QHS, Gillis Santa, MD, 50 mg at 05/16/23 2108  Allergies: Allergies  Allergen Reactions   Atorvastatin Hives, Itching and Other (See Comments)    Other reaction(s): Other (See Comments)   Simvastatin Hives, Itching and Other (See Comments)    Other reaction(s): Other (See Comments)    Verner Chol, MD

## 2023-05-17 NOTE — Progress Notes (Signed)
 Physical Therapy Evaluation Patient Details Name: Kyle Roberson MRN: [id number] DOB: 04-08-1940 Today's Date: 05/17/2023  History of Present Illness  Kyle Roberson is a 83 y.o. male with medical history significant of ESRD-HD (MWF), HTN, HLD, DM, CAD with stent, dCHF, depression with anxiety, BPH, anemia, A-fib on Eliquis, left eye blindness, who presents with cough, shortness of breath, diarrhea, fall. COVID +    Clinical Impression  Patient seated EOB with OT present at time of PT arrival. Patient still presents with significant pain in BLE's limiting tolerance. Patient was agreeable to stand with RW, able to complete STS Max A +2, poor posture/standing tolerance. Attempt lateral steps but unable to clear feet. Max A to Total A +2 to scoot to [facility]. Sit > Supine, patient with Mod A at trunk, able to complete LE movement but require multimodal cues. Patient continue to demo outbursts with tactile touch to BLE's due to pain. Patient left in bed with all needs in reach. Patient will benefit from skilled acute PT services to address functional impairments (see below for additional) and maximize functional mobility. Anticipate the need for follow up PT services upon acute hospital discharge. Will continue to follow acutely.        If plan is discharge home, recommend the following: Two people to help with walking and/or transfers;Two people to help with bathing/dressing/bathroom;Assist for transportation;Help with stairs or ramp for entrance   Can travel by private vehicle   No    Equipment Recommendations Other (comment) (TBD)  Recommendations for Other Services       Functional Status Assessment Patient has had a recent decline in their functional status and/or demonstrates limited ability to make significant improvements in function in a reasonable and predictable amount of time     Precautions / Restrictions Precautions Precautions: Fall Recall of Precautions/Restrictions:  Impaired Restrictions Weight Bearing Restrictions Per Provider Order: No      Mobility  Bed Mobility Overal bed mobility: Needs Assistance Bed Mobility: Sit to Supine           General bed mobility comments: patient able to move LE's along bed but require Max A to Total A +2 to scoot. Limited tolerance for LE movement. For supine > sit, assist required to at trunk, light tactile cue and verbal cues for lifting legs onto bedside. Pt able to complete, inc time/effort required    Transfers Overall transfer level: Needs assistance Equipment used: Rolling walker (2 wheels) Transfers: Sit to/from Stand Sit to Stand: Max assist, +2 physical assistance           General transfer comment: STS from EOB with Max A +2 and RW; cues for hand placement. Poor posture, difficulting getting full upright. Pt with limited tolerance requested to sit back down. Attempt to take some lateral steps alongside bed patient unable to clear feet.    Ambulation/Gait               General Gait Details: unable  Stairs            Wheelchair Mobility     Tilt Bed    Modified Rankin (Stroke Patients Only)       Balance Overall balance assessment: History of Falls, Needs assistance Sitting-balance support: Feet supported, Bilateral upper extremity supported Sitting balance-Leahy Scale: Fair     Standing balance support: Bilateral upper extremity supported, During functional activity, Reliant on assistive device for balance Standing balance-Leahy Scale: Poor  Pertinent Vitals/Pain Pain Assessment Pain Assessment: Faces Faces Pain Scale: Hurts whole lot Pain Location: LEs with any movement/touch. Yelling out/swearing Pain Descriptors / Indicators: Discomfort, Sore Pain Intervention(s): Limited activity within patient's tolerance, Monitored during session    Home Living Family/patient expects to be discharged to:: Private  residence Living Arrangements: Spouse/significant other Available Help at Discharge: Family;Available 24 hours/day Type of Home: House Home Access: Ramped entrance       Home Layout: One level Home Equipment: Cane - quad;Shower seat;Grab bars - toilet;Grab bars - tub/shower;Hand held shower head;Wheelchair Financial trader (4 wheels)      Prior Function Prior Level of Function : Needs assist;History of Falls (last six months)             Mobility Comments: at baseline does not go out of house for mobility unless needed ADLs Comments: Wife assists with ADL (min-modA)     Extremity/Trunk Assessment   Upper Extremity Assessment Upper Extremity Assessment: Defer to OT evaluation    Lower Extremity Assessment Lower Extremity Assessment: Generalized weakness RLE: Unable to fully assess due to pain LLE: Unable to fully assess due to pain       Communication   Communication Communication: No apparent difficulties    Cognition Arousal: Alert Behavior During Therapy: Agitated                             Following commands: Intact       Cueing       General Comments      Exercises     Assessment/Plan    PT Assessment Patient needs continued PT services  PT Problem List Decreased strength;Decreased range of motion;Decreased activity tolerance;Decreased balance;Decreased mobility;Pain       PT Treatment Interventions DME instruction;Gait training;Functional mobility training;Therapeutic activities;Therapeutic exercise;Balance training;Neuromuscular re-education;Patient/family education    PT Goals (Current goals can be found in the Care Plan section)  Acute Rehab PT Goals Patient Stated Goal: to go home PT Goal Formulation: With patient Time For Goal Achievement: 05/31/23 Potential to Achieve Goals: Poor    Frequency Min 1X/week     Co-evaluation PT/OT/SLP Co-Evaluation/Treatment: Yes   PT goals addressed during session: Mobility/safety  with mobility OT goals addressed during session: ADL's and self-care       AM-PAC PT "6 Clicks" Mobility  Outcome Measure Help needed turning from your back to your side while in a flat bed without using bedrails?: A Lot Help needed moving from lying on your back to sitting on the side of a flat bed without using bedrails?: A Lot Help needed moving to and from a bed to a chair (including a wheelchair)?: Total Help needed standing up from a chair using your arms (e.g., wheelchair or bedside chair)?: Total Help needed to walk in hospital room?: Total Help needed climbing 3-5 steps with a railing? : Total 6 Click Score: 8    End of Session   Activity Tolerance: Patient limited by pain Patient left: in bed;with call bell/phone within reach;with bed alarm set;with family/visitor present Nurse Communication: Mobility status PT Visit Diagnosis: Muscle weakness (generalized) (M62.81);Difficulty in walking, not elsewhere classified (R26.2);Other abnormalities of gait and mobility (R26.89)    Time: 1425-1435 PT Time Calculation (min) (ACUTE ONLY): 10 min   Charges:   PT Evaluation $PT Eval Low Complexity: 1 Low   PT General Charges $$ ACUTE PT VISIT: 1 Visit         Creed Copper Fairly, PT,  DPT 05/17/23 2:50 PM

## 2023-05-17 NOTE — Progress Notes (Signed)
 Hemodialysis note  Received patient in bed to unit. Transferred patient to a dialysis recliner with assistance. Alert and oriented.  Informed consent signed and in chart.  Treatment initiated: 0932 Treatment completed: 1309 Tx dutration: 3.5 hours   Patient tolerated well. Transported back to room, alert without acute distress.  Report given to patient's RN.   Access used:  Right Chest HD catheter Access issues: none  Total UF removed: 2L Medication(s) given:  Retacrit 4000 units IV  Post HD weight: unable to obtain. Patient can't tolerate standing up.   Wolfgang Phoenix [facility]

## 2023-05-17 NOTE — Plan of Care (Signed)
 Attempted to get patient in chair for dialysis but patient refused and said "maybe later."

## 2023-05-17 NOTE — [place] FL2 (Signed)
 [place] MEDICAID FL2 LEVEL OF CARE FORM     IDENTIFICATION  Patient Name: Kyle Roberson Birthdate: Sep 20, 1940 Sex: male Admission Date (Current Location): 05/05/2023  [facility] and [place] Number:  Chiropodist and Address:         Provider Number: [id number]  Attending Physician Name and Address:  Arnetha Courser, MD  Relative Name and Phone Number:       Current Level of Care: Hospital Recommended Level of Care: Skilled Nursing Facility Prior Approval Number:    Date Approved/Denied:   PASRR Number: [id number]  Discharge Plan: SNF    Current Diagnoses: Patient Active Problem List   Diagnosis Date Noted   Acute delirium 05/15/2023   Uremia of renal origin 05/07/2023   Demand ischemia (HCC) 05/06/2023   COVID-19 05/05/2023   Myocardial injury 05/05/2023   Type II diabetes mellitus with renal manifestations (HCC) 05/05/2023   Diarrhea 05/05/2023   Fall 05/05/2023   Obesity (BMI 30-39.9) 05/05/2023   Weakness 01/04/2023   ESRD on dialysis (HCC) 12/09/2022   Cardiogenic shock (HCC) 12/04/2022   Cardiorenal syndrome 12/04/2022   Pressure injury of skin 12/04/2022   Acute on chronic diastolic CHF (congestive heart failure) (HCC) 11/26/2022   Hypervolemia 11/26/2022   Pulmonary hypertension, unspecified (HCC) 02/19/2022   Encounter regarding vascular access for dialysis for end-stage renal disease (HCC) 02/19/2022   Permanent atrial fibrillation (HCC) 02/11/2022   Paroxysmal atrial fibrillation (HCC) 02/09/2022   Dyslipidemia 02/09/2022   Depression 02/09/2022   Elevated troponin 02/09/2022   Thyroid nodule 06/04/2021   Diabetic peripheral neuropathy (HCC)    History of subdural hemorrhage 02/04/2021   Hyperlipidemia associated with type 2 diabetes mellitus (HCC)    Acute on chronic right-sided heart failure (HCC) 05/07/2019   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 04/28/2019   CKD (chronic kidney disease) stage 4, GFR 15-29  ml/min (HCC) 04/27/2019   Acute on chronic heart failure with preserved ejection fraction (HFpEF) (HCC) 04/27/2019   Obesity, Class III, BMI 40-49.9 (morbid obesity) (HCC) 04/27/2019   Lymphedema 03/23/2019   Erectile dysfunction 08/01/2018   History of colonic diverticulitis 08/01/2018   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   Venous insufficiency 08/01/2018   Persistent atrial fibrillation (HCC) 08/01/2018   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   Essential hypertension 07/06/2018   CAD (coronary artery disease) 07/06/2018   Type 2 diabetes mellitus with stage 4 chronic kidney disease, without long-term current use of insulin (HCC) 07/06/2018   Coronary artery disease involving native coronary artery of native heart without angina pectoris 06/05/2018   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/30/2017   Vitamin D deficiency 01/30/2017   Benign non-nodular prostatic hyperplasia with lower urinary tract symptoms 09/29/2014   Nephrolithiasis 01/04/2012    Orientation RESPIRATION BLADDER Height & Weight     Self, Place  Normal Continent Weight: 90.1 kg Height:  5\' 9"  (175.3 cm)  BEHAVIORAL SYMPTOMS/MOOD NEUROLOGICAL BOWEL NUTRITION STATUS      Continent Diet (Renal)  AMBULATORY STATUS COMMUNICATION OF NEEDS Skin   Extensive Assist Verbally Bruising                       Personal Care Assistance Level of Assistance              Functional Limitations Info             SPECIAL CARE FACTORS FREQUENCY  PT (By licensed PT), OT (By licensed OT)  Contractures Contractures Info: Not present    Additional Factors Info  Code Status, Allergies, Isolation Precautions Code Status Info: Full Allergies Info: Atorvastatin, Simvastatin     Isolation Precautions Info: covid positive 05/05/23     Current Medications (05/17/2023):  This is the current hospital active medication list Current Facility-Administered Medications  Medication Dose Route  Frequency Provider Last Rate Last Admin   acetaminophen (TYLENOL) tablet 650 mg  650 mg Oral Q6H PRN Gillis Santa, MD   650 mg at 05/16/23 2109   albuterol (PROVENTIL) (2.5 MG/3ML) 0.083% nebulizer solution 2.5 mg  2.5 mg Nebulization Q4H PRN Manuela Schwartz, NP       apixaban Everlene Balls) tablet 2.5 mg  2.5 mg Oral BID Antonieta Iba, MD   2.5 mg at 05/17/23 1444   ascorbic acid (VITAMIN C) tablet 250 mg  250 mg Oral Daily Lorretta Harp, MD   250 mg at 05/17/23 1444   bisacodyl (DULCOLAX) EC tablet 10 mg  10 mg Oral QHS Gillis Santa, MD   10 mg at 05/16/23 2109   bisacodyl (DULCOLAX) suppository 10 mg  10 mg Rectal Daily PRN Gillis Santa, MD       budesonide (PULMICORT) nebulizer solution 0.5 mg  0.5 mg Nebulization BID Manuela Schwartz, NP   0.5 mg at 05/17/23 0805   calcium acetate (PHOSLO) capsule 1,334 mg  1,334 mg Oral TID WC Wendee Beavers, NP   1,334 mg at 05/17/23 1443   Chlorhexidine Gluconate Cloth 2 % PADS 6 each  6 each Topical Daily Gillis Santa, MD   6 each at 05/17/23 1446   clotrimazole-betamethasone (LOTRISONE) cream   Topical BID Gillis Santa, MD   1 Application at 05/17/23 1447   dexamethasone (DECADRON) tablet 2 mg  2 mg Oral Daily Gillis Santa, MD   2 mg at 05/17/23 1445   dextromethorphan-guaiFENesin (MUCINEX DM) 30-600 MG per 12 hr tablet 1 tablet  1 tablet Oral BID PRN Lorretta Harp, MD   1 tablet at 05/16/23 2109   dorzolamide-timolol (COSOPT) 2-0.5 % ophthalmic solution 1 drop  1 drop Both Eyes Daily Lorretta Harp, MD   1 drop at 05/17/23 1448   epoetin alfa-epbx (RETACRIT) injection 4,000 Units  4,000 Units Intravenous Q M,W,F-HD Wendee Beavers, NP   4,000 Units at 05/17/23 1148   feeding supplement (NEPRO CARB STEADY) liquid 237 mL  237 mL Oral BID BM Wendee Beavers, NP   237 mL at 05/17/23 1447   guaiFENesin (MUCINEX) 12 hr tablet 600 mg  600 mg Oral BID Gillis Santa, MD   600 mg at 05/17/23 1444   haloperidol lactate (HALDOL) injection 2 mg  2 mg Intravenous  Q6H PRN Gillis Santa, MD   2 mg at 05/14/23 2236   heparin sodium (porcine) injection 1,000 Units  1,000 Units Intracatheter Continuous PRN Wendee Beavers, NP   1,000 Units at 05/17/23 1306   hydrALAZINE (APRESOLINE) injection 5 mg  5 mg Intravenous Q2H PRN Lorretta Harp, MD       HYDROmorphone (DILAUDID) tablet 2 mg  2 mg Oral Q6H PRN Gillis Santa, MD       Or   HYDROmorphone (DILAUDID) injection 1 mg  1 mg Intravenous Q4H PRN Gillis Santa, MD       insulin aspart (novoLOG) injection 0-5 Units  0-5 Units Subcutaneous QHS Lorretta Harp, MD   2 Units at 05/16/23 2110   insulin aspart (novoLOG) injection 0-9 Units  0-9 Units Subcutaneous TID WC Lorretta Harp, MD   1 Units  at 05/17/23 1448   insulin aspart (novoLOG) injection 4 Units  4 Units Subcutaneous TID WC Gillis Santa, MD   4 Units at 05/17/23 1448   insulin glargine-yfgn [facility]) injection 5 Units  5 Units Subcutaneous Daily Gillis Santa, MD   5 Units at 05/17/23 1445   ipratropium-albuterol (DUONEB) 0.5-2.5 (3) MG/3ML nebulizer solution 3 mL  3 mL Nebulization Q6H Manuela Schwartz, NP   3 mL at 05/17/23 0805   loperamide (IMODIUM) capsule 2 mg  2 mg Oral BID PRN Lorretta Harp, MD   2 mg at 05/06/23 0454   midodrine (PROAMATINE) tablet 10 mg  10 mg Oral TID WC Arnetha Courser, MD   10 mg at 05/17/23 1443   multivitamin (RENA-VIT) tablet 1 tablet  1 tablet Oral Marice Potter, MD   1 tablet at 05/16/23 2108   neomycin-polymyxin b-dexamethasone (MAXITROL) ophthalmic ointment 1 Application  1 Application Right Eye TID Lorretta Harp, MD   1 Application at 05/16/23 2123   nitroGLYCERIN (NITROLINGUAL) 0.4 MG/SPRAY spray 1 spray  1 spray Sublingual PRN Lorretta Harp, MD       ondansetron [facility]) injection 4 mg  4 mg Intravenous Q8H PRN Lorretta Harp, MD   4 mg at 05/10/23 2152   pantoprazole (PROTONIX) EC tablet 40 mg  40 mg Oral BID Orson Aloe, RPH   40 mg at 05/17/23 1444   polyethylene glycol (MIRALAX / GLYCOLAX) packet 17 g  17 g Oral BID Gillis Santa, MD   17 g at 05/17/23 1443   QUEtiapine (SEROQUEL) tablet 50 mg  50 mg Oral BID Gillis Santa, MD   50 mg at 05/17/23 1444   rosuvastatin (CRESTOR) tablet 10 mg  10 mg Oral Once per day on Monday Wednesday Friday Lorretta Harp, MD   10 mg at 05/17/23 1444   traZODone (DESYREL) tablet 50 mg  50 mg Oral QHS Gillis Santa, MD   50 mg at 05/16/23 2108     Discharge Medications: Please see discharge summary for a list of discharge medications.  Relevant Imaging Results:  Relevant Lab Results:   Additional Information SS # [id number] - outpatient HD Jen Mow MWF 1145  Chapman Fitch, RN

## 2023-05-17 NOTE — Progress Notes (Signed)
 [facility]  ROUNDING NOTE   Subjective:   Kyle Roberson is a 83 y.o. male with diabetes mellitus type II, hypertension, coronary artery disease, chronic diastolic congestive heart failure, atrial fibrillation, pulmonary hypertension, and historyof subdural hematoma who is admitted for Weakness [R53.1] Fall, initial encounter [W19.XXXA] COVID-19 virus infection [U07.1] COVID-19 [U07.1] Uremia of renal origin [N19]  Patient is known to our practice and receives outpatient dialysis at [facility] on a MWF, supervised by Dr Cherylann Ratel. He received his last treatment on Wednesday.   Update: Patient seen and evaluated during dialysis   HEMODIALYSIS FLOWSHEET:  Blood Flow Rate (mL/min): 299 mL/min Arterial Pressure (mmHg): -142.01 mmHg Venous Pressure (mmHg): 110.7 mmHg TMP (mmHg): 18.38 mmHg Ultrafiltration Rate (mL/min): 835 mL/min Dialysate Flow Rate (mL/min): 299 ml/min Dialysis Fluid Bolus: Normal Saline Bolus Amount (mL): 200 mL  Patient assisted to chair prior to dialysis Tolerated transfer fair, stand pivot   Objective:  Vital signs in last 24 hours:  Temp:  [97.5 F (36.4 C)-98.3 F (36.8 C)] 98.1 F (36.7 C) (02/24 0915) Pulse Rate:  [46-64] 64 (02/24 1116) Resp:  [16-26] 22 (02/24 1116) BP: (105-138)/(48-78) 132/78 (02/24 1116) SpO2:  [91 %-98 %] 92 % (02/24 1116) Weight:  [90.9 kg] 90.9 kg (02/24 0441)  Weight change: -1.2 kg Filed Weights   05/15/23 1239 05/16/23 0500 05/17/23 0441  Weight: 92.1 kg 91.4 kg 90.9 kg    Intake/Output: No intake/output data recorded.   Intake/Output this shift:  No intake/output data recorded.  Physical Exam: General: NAD  Head: Normocephalic, atraumatic. Moist oral mucosal membranes  Eyes: Anicteric  Lungs:  Clear to auscultation, room air  Heart: Regular rate and rhythm  Abdomen:  Soft, nontender  Extremities: 1+ peripheral edema.  Neurologic: Alert and oriented, moving all four extremities  Skin:  Scaly bilateral lower extremities  Access: Right chest tunneled access    Basic Metabolic Panel: Recent Labs  Lab 05/10/23 1220 05/11/23 0800 05/12/23 0809 05/13/23 0610 05/15/23 0900 05/17/23 0932  NA 138 135 137 136 140 138  K 4.4 4.3 5.0 4.1 5.3* 5.0  CL 99 96* 99 99 100 99  CO2 21* 22 21* 24 21* 21*  GLUCOSE 179* 175* 234* 151* 106* 222*  BUN 119* 75* 98* 60* 109* 96*  CREATININE 10.78* 7.12* 8.58* 5.69* 9.15* 7.49*  CALCIUM 7.6* 7.6* 7.7* 7.9* 8.3* 7.6*  MG 2.0 1.8  --   --   --   --   PHOS 8.5* 6.1*  --   --  7.7* 7.4*    Liver Function Tests: Recent Labs  Lab 05/15/23 0900 05/17/23 0932  ALBUMIN 2.6* 2.4*    No results for input(s): "LIPASE", "AMYLASE" in the last 168 hours.  No results for input(s): "AMMONIA" in the last 168 hours.  CBC: Recent Labs  Lab 05/11/23 0800 05/12/23 0809 05/13/23 0610 05/15/23 0900 05/17/23 0932  WBC 6.8 7.5 9.8 13.5* 10.6*  NEUTROABS  --   --   --   --  9.1*  HGB 8.3* 8.7* 9.8* 9.5* 9.1*  HCT 25.0* 26.3* 29.0* 28.9* 27.9*  MCV 95.4 95.6 95.4 96.7 97.2  PLT 172 170 198 231 199    Cardiac Enzymes: No results for input(s): "CKTOTAL", "CKMB", "CKMBINDEX", "TROPONINI" in the last 168 hours.   BNP: Invalid input(s): "POCBNP"  CBG: Recent Labs  Lab 05/15/23 1616 05/16/23 0755 05/16/23 1339 05/16/23 1627 05/16/23 2103  GLUCAP 121* 178* 121* 159* 212*    Microbiology: Results for orders placed or  performed during the hospital encounter of 05/05/23  Resp panel by RT-PCR (RSV, Flu A&B, Covid) Anterior Nasal Swab     Status: Abnormal   Collection Time: 05/05/23  4:33 PM   Specimen: Anterior Nasal Swab  Result Value Ref Range Status   SARS Coronavirus 2 by RT PCR POSITIVE (A) NEGATIVE Final    Comment: (NOTE) SARS-CoV-2 target nucleic acids are DETECTED.  The SARS-CoV-2 RNA is generally detectable in upper respiratory specimens during the acute phase of infection. Positive results are indicative of the presence  of the identified virus, but do not rule out bacterial infection or co-infection with other pathogens not detected by the test. Clinical correlation with patient history and other diagnostic information is necessary to determine patient infection status. The expected result is Negative.  Fact Sheet for Patients: BloggerCourse.com  Fact Sheet for Healthcare Providers: SeriousBroker.it  This test is not yet approved or cleared by the [place] FDA and  has been authorized for detection and/or diagnosis of SARS-CoV-2 by FDA under an Emergency Use Authorization (EUA).  This EUA will remain in effect (meaning this test can be used) for the duration of  the COVID-19 declaration under Section 564(b)(1) of the A ct, 21 U.S.C. section 360bbb-3(b)(1), unless the authorization is terminated or revoked sooner.     Influenza A by PCR NEGATIVE NEGATIVE Final   Influenza B by PCR NEGATIVE NEGATIVE Final    Comment: (NOTE) The Xpert Xpress SARS-CoV-2/FLU/RSV plus assay is intended as an aid in the diagnosis of influenza from Nasopharyngeal swab specimens and should not be used as a sole basis for treatment. Nasal washings and aspirates are unacceptable for Xpert Xpress SARS-CoV-2/FLU/RSV testing.  Fact Sheet for Patients: BloggerCourse.com  Fact Sheet for Healthcare Providers: SeriousBroker.it  This test is not yet approved or cleared by the [place] FDA and has been authorized for detection and/or diagnosis of SARS-CoV-2 by FDA under an Emergency Use Authorization (EUA). This EUA will remain in effect (meaning this test can be used) for the duration of the COVID-19 declaration under Section 564(b)(1) of the Act, 21 U.S.C. section 360bbb-3(b)(1), unless the authorization is terminated or revoked.     Resp Syncytial Virus by PCR NEGATIVE NEGATIVE Final    Comment: (NOTE) Fact Sheet  for Patients: BloggerCourse.com  Fact Sheet for Healthcare Providers: SeriousBroker.it  This test is not yet approved or cleared by the [place] FDA and has been authorized for detection and/or diagnosis of SARS-CoV-2 by FDA under an Emergency Use Authorization (EUA). This EUA will remain in effect (meaning this test can be used) for the duration of the COVID-19 declaration under Section 564(b)(1) of the Act, 21 U.S.C. section 360bbb-3(b)(1), unless the authorization is terminated or revoked.  Performed at [facility], [address] [place], [place] [address]   C Difficile Quick Screen w PCR reflex     Status: None   Collection Time: 05/05/23  7:35 PM   Specimen: STOOL  Result Value Ref Range Status   C Diff antigen NEGATIVE NEGATIVE Final   C Diff toxin NEGATIVE NEGATIVE Final   C Diff interpretation No C. difficile detected.  Final    Comment: Performed at [facility], [address] [place], [place] [address]  MRSA Next Gen by PCR, Nasal     Status: None   Collection Time: 05/15/23  6:37 PM   Specimen: Nasal Mucosa; Nasal Swab  Result Value Ref Range Status   MRSA by PCR Next Gen NOT DETECTED NOT  DETECTED Final    Comment: (NOTE) The GeneXpert MRSA Assay (FDA approved for NASAL specimens only), is one component of a comprehensive MRSA colonization surveillance program. It is not intended to diagnose MRSA infection nor to guide or monitor treatment for MRSA infections. Test performance is not FDA approved in patients less than 31 years old. Performed at [facility], [address] [place], [place] [address]     Coagulation Studies: No results for input(s): "LABPROT", "INR" in the last 72 hours.   Urinalysis: No results for input(s): "COLORURINE", "LABSPEC", "PHURINE", "GLUCOSEU", "HGBUR", "BILIRUBINUR", "KETONESUR", "PROTEINUR", "UROBILINOGEN", "NITRITE", "LEUKOCYTESUR" in the  last 72 hours.  Invalid input(s): "APPERANCEUR"    Imaging: No results found.    Medications:      apixaban  2.5 mg Oral BID   ascorbic acid  250 mg Oral Daily   bisacodyl  10 mg Oral QHS   budesonide (PULMICORT) nebulizer solution  0.5 mg Nebulization BID   calcium acetate  1,334 mg Oral TID WC   Chlorhexidine Gluconate Cloth  6 each Topical Daily   clotrimazole-betamethasone   Topical BID   dexamethasone  2 mg Oral Daily   dorzolamide-timolol  1 drop Both Eyes Daily   epoetin alfa-epbx (RETACRIT) injection  4,000 Units Intravenous Q M,W,F-HD   feeding supplement (NEPRO CARB STEADY)  237 mL Oral BID BM   guaiFENesin  600 mg Oral BID   insulin aspart  0-5 Units Subcutaneous QHS   insulin aspart  0-9 Units Subcutaneous TID WC   insulin aspart  4 Units Subcutaneous TID WC   insulin glargine-yfgn  5 Units Subcutaneous Daily   ipratropium-albuterol  3 mL Nebulization Q6H   midodrine  10 mg Oral TID WC   multivitamin  1 tablet Oral QHS   neomycin-polymyxin b-dexamethasone  1 Application Right Eye TID   pantoprazole  40 mg Oral BID   polyethylene glycol  17 g Oral BID   QUEtiapine  50 mg Oral BID   rosuvastatin  10 mg Oral Once per day on Monday Wednesday Friday   traZODone  50 mg Oral QHS   acetaminophen, albuterol, bisacodyl, dextromethorphan-guaiFENesin, haloperidol lactate, hydrALAZINE, HYDROmorphone **OR** HYDROmorphone (DILAUDID) injection, loperamide, nitroGLYCERIN, ondansetron (ZOFRAN) IV  Assessment/ Plan:  Mr. Faustino Luecke is a 83 y.o.  male with diabetes mellitus type II, hypertension, coronary artery disease, chronic diastolic congestive heart failure, atrial fibrillation, pulmonary hypertension, and historyof subdural hematoma who is admitted for Weakness [R53.1] Fall, initial encounter [W19.XXXA] COVID-19 virus infection [U07.1] COVID-19 [U07.1] Uremia of renal origin [N19]  [facility]/MWF/right chest PermCath  End-stage renal disease on  hemodialysis.  Receiving treatment today, seated in chair. Tolerating well. Next treatment scheduled for Wednesday.   COVID-19, found positive this admission.  Isolation discontinued on 05/15/23. Room air  3. Anemia of chronic kidney disease  Lab Results  Component Value Date   HGB 9.1 (L) 05/17/2023    Hemoglobin  within desired range.  Continue low dose EPO with dialysis.  Patient receives Mircera at outpatient clinic.   4. Secondary Hyperparathyroidism: with outpatient labs: None available  Lab Results  Component Value Date   PTH 344 (H) 01/07/2023   CALCIUM 7.6 (L) 05/17/2023   CAION 1.14 (L) 12/02/2022   PHOS 7.4 (H) 05/17/2023    Calcium continues to fluctuate. Continue binders as ordered with meals.     LOS: 10 Rickard Kennerly 2/24/202511:43 AM

## 2023-05-17 NOTE — Progress Notes (Signed)
 Wife of pt at bedside and expressed some concern about pt's disposition, according to spouse, she was told pt was ineligible for IPR due to his refusal of PT/OT and mobilization. She feels it is unsafe for pt to return home in current state. Per chart pt has been refusing therapy services as well as some medications and interventions. Also per chart it appears SNF has been recommended but pt refused and states he wanted to go home, pt is confused and was seen by psych today to determine decision making capability, however pt refused. Inform spouse that she needs to speak w/ case management/SW and also try to be here at bedside during these assessments. Wife states she will reach out to [place] tomorrow and try and speak w/ case manager. CN notified. Will f/u in am

## 2023-05-17 NOTE — TOC Progression Note (Addendum)
 Transition of Care [facility]) - Progression Note    Patient Details  Name: Kyle Roberson MRN: [id number] Date of Birth: 10-14-1940  Transition of [facility]) CM/SW Contact  Chapman Fitch, RN Phone Number: 05/17/2023, 3:40 PM  Clinical Narrative:     Received notification from wife that she would like patient to go to rehab at discharge and request a different MD  MD notified, and to order PT  eval   Update:  Therapy recommending SNF.   Bed search started wife updated   Expected Discharge Plan: Home/Self Care Barriers to Discharge: Continued Medical Work up  Expected Discharge Plan and Services   Discharge Planning Services: CM Consult   Living arrangements for the past 2 months: Single Family Home                 DME Arranged: N/A DME Agency: NA       HH Arranged: Refused SNF, Patient Refused HH HH Agency: NA         Social Determinants of Health (SDOH) Interventions SDOH Screenings   Food Insecurity: No Food Insecurity (05/07/2023)  Housing: Low Risk  (05/08/2023)  Transportation Needs: No Transportation Needs (05/08/2023)  Utilities: Not At Risk (05/08/2023)  Depression (PHQ2-9): Low Risk  (07/27/2022)  Tobacco Use: Medium Risk (05/05/2023)    Readmission Risk Interventions    05/12/2023   10:07 AM  Readmission Risk Prevention Plan  Transportation Screening Complete  Medication Review (RN Care Manager) Referral to Pharmacy  PCP or Specialist appointment within 3-5 days of discharge Complete  HRI or Home Care Consult Complete  Palliative Care Screening Not Applicable  Skilled Nursing Facility Not Applicable

## 2023-05-17 NOTE — Plan of Care (Signed)
  Problem: Education: Goal: Knowledge of risk factors and measures for prevention of condition will improve Outcome: Progressing   Problem: Respiratory: Goal: Will maintain a patent airway Outcome: Progressing Goal: Complications related to the disease process, condition or treatment will be avoided or minimized Outcome: Progressing   Problem: Education: Goal: Knowledge of General Education information will improve Description: Including pain rating scale, medication(s)/side effects and non-pharmacologic comfort measures Outcome: Progressing   Problem: Activity: Goal: Risk for activity intolerance will decrease Outcome: Progressing   Problem: Nutrition: Goal: Adequate nutrition will be maintained Outcome: Progressing

## 2023-05-17 NOTE — Progress Notes (Signed)
 [facility] Progress Note  Patient: Kyle Roberson    ZOX:[id number]  DOA: 05/05/2023     Date of Service: the patient was seen and examined on 05/17/2023  Chief Complaint  Patient presents with   Fall   Brief hospital course:  Kyle Roberson is a 83 y.o. male with medical history significant of ESRD-HD (MWF), HTN, HLD, DM, CAD with stent, dCHF, depression with anxiety, BPH, anemia, A-fib on Eliquis, left eye blindness, who presents with cough, shortness of breath, diarrhea, fall.   Per pt and his wife (I called his wife by phone), patient has productive cough, sore throat, SOB, malaise for more than 5 days.  No chest pain.  No fever or chills.  Patient has diarrhea for more than 10 days, with 2- 3 times of watery diarrhea each day.  No nausea, vomiting or abdominal pain.  Patient fell this morning, no loss of consciousness. He patient states that he was going to the bathroom when he suddenly felt weak in his gave out and fell.  No unilateral numbness or tingling in extremities.  No facial droop or slurred speech.  Patient states that he has missed several dialysis. His last dialysis was on Monday.     Data reviewed independently and ED Course: pt was found to have positive COVID 19, WBC 5.6, troponin 95 --> 105, potassium 4.7, bicarbonate 18, creatinine 10.02, BUN 102.  CT head negative. Chest x-ray showed cardiomegaly with vascular congestion.  Patient is placed on telemetry bed for observation.     EKG: I have personally reviewed.  A-fib, QTc 461, low voltage, poor IV progression, anteroseptal infarction pattern.  2/22: Hemodynamically stable, getting dialysis.  Lengthy discussion with wife on phone to make decision regarding SNF versus home health, patient does not want to go to rehab himself and becoming agitated and combative. Psych was also consulted for capacity evaluation-pending. Currently being managed with as needed Haldol.  2/23; yesterday evening patient was  transferred to stepdown due to softer blood pressure which shows initial poor response to IV fluid.  Later blood pressure started improving with addition of midodrine.  Patient is now at baseline and remained quite combative and rude, refusing most of the care.  Being transferred out of stepdown.  2/24: Patient had dialysis today.  Behavior remained the same.  Patient does not have any capacity to make any medical decision per psychiatry today.  Wife would like him to go to rehab.  TOC to start looking for a place.  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 nightly and Seroquel 25 mg p.o. twice daily 2/20 psych consulted for further recommendation Patient remained agitated and eventually psych was able to see him today, patient does not have any capacity -Continue as needed Haldol  # COVID-19 virus infection: No fever, no leukocytosis, no oxygen desaturation.   Chest x-ray showed vascular congestion, no infiltration. -Bronchodilators, Mucinex 600 mg p.o. twice daily, Robitussin DM as needed for cough -Continue supportive care 2/15 still sob after HD, may benefit from steroids and breathing treatments S/p Prednisone 40 mg p.o. daily x 3 days, Breo Ellipta inhaler Continue albuterol as needed 2/16 patient is refusing inhalers, started on nebulizer treatments if he agrees. 2/18 started Decadron 6 mg p.o. daily x 4 days til 2/21 2/21 decreased Decadron 4 mg p.o. daily for 2 days followed by 2 mg p.o. daily for 2 days due to mental status changes.  Steroids may be contributing. Patient is refusing inhalers   # CAD (coronary artery disease) and Myocardial injury:  Troponin 894, but No chest pain and no significant EKG changes Continue to monitor on telemetry S/p ASA 81 mg and heparin IV infusion, discontinued on 2/14, due to GI bleed 2/14 continue to hold Eliquis for now Cardiology  consulted, recommended no ischemic workup at this time, follow as an outpatient for Myoview when recovered after COVID infection TTE shows LVEF 55 to 60%, grade 2 diastolic dysfunction, no wall motion abnormality.  Severe pulmonary hypertension, RA moderately dilated.  Moderate TR As per cardio patient may need right and left heart cath due to pulmonary hypertension and elevated troponin.  # Paroxysmal atrial fibrillation: -Tele monitoring - s/p heparin IV infusion, Hb dropped, suspected GI bleed.  Patient was cleared by GI to continue heparin IV infusion.  2/15 resumed Eliquis 2.5 mg p.o. 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 Current hemoglobin seems stable around 9.5   # ESRD on dialysis -consulted nephrology Continue hemodialysis MWF schedule Hyperphosphatemia, started PhosLo   # Fall at home, initial encounter: CT-head negative -Fall precaution, CK level wnl -PT/OT -recommending SNF but patient is refusing  # Type II diabetes mellitus with renal manifestations:  Recent A1c 7.3, poorly controlled for patient taking glipizide at home -SSI 2/18 started Semglee 5 units subcu daily and NovoLog 4 units 3 times daily due to hyperglycemia secondary to steroids. Follow diabetic coordinator and titrate insulin dose accordingly. Monitor CBG and continue diabetic diet   # Essential hypertension: Patient's not taking medications.   Nonsudden V. tach episode 7 beats run at 630 on 2/13 reported 2/13 started metoprolol 12.5 mg p.o. 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 Difficile negative Now patient developed constipation.  # Constipation, started laxatives on 2/19 2/21 started Dulcolax p.o. Use suppository and enema as needed  Body mass index is 29.33 kg/m.  Interventions:  Diet: Renal diet DVT Prophylaxis: Heparin IV infusion  Advance goals of care discussion: Full code  Family Communication:   Disposition:  Patient likely needs SNF due to worsening functional status but declining.  A lot of behavioral issues   Subjective: Patient was sleeping and getting dialysis when seen today.  We did not disturb him at nursing request as he started cursing whenever some one disturbed him.  Physical Exam: General. Frail elderly man,In no acute distress. Pulmonary.  Lungs clear bilaterally, normal respiratory effort. CV.  Regular rate and rhythm, no JVD, rub or murmur. Abdomen.  Soft, nontender, nondistended, BS positive. CNS.  Resting , no apparent deficit. Extremities.  No edema, no cyanosis, pulses intact and symmetrical.    Vitals:   05/17/23 1300 05/17/23 1309 05/17/23 1346 05/17/23 1450  BP: 113/73 (!) 144/67 (!) 159/72   Pulse: 69 61 68   Resp: (!)  25 (!) 29 20   Temp:  97.6 F (36.4 C) 98.5 F (36.9 C)   TempSrc:   Oral   SpO2: 96% 96% 99%   Weight:    90.1 kg  Height:        Intake/Output Summary (Last 24 hours) at 05/17/2023 1709 Last data filed at 05/17/2023 1309 Gross per 24 hour  Intake --  Output 2000 ml  Net -2000 ml    Filed Weights   05/16/23 0500 05/17/23 0441 05/17/23 1450  Weight: 91.4 kg 90.9 kg 90.1 kg    Data Reviewed: Prior data reviewed.  CBC: Recent Labs  Lab 05/11/23 0800 05/12/23 0809 05/13/23 0610 05/15/23 0900  05/17/23 0932  WBC 6.8 7.5 9.8 13.5* 10.6*  NEUTROABS  --   --   --   --  9.1*  HGB 8.3* 8.7* 9.8* 9.5* 9.1*  HCT 25.0* 26.3* 29.0* 28.9* 27.9*  MCV 95.4 95.6 95.4 96.7 97.2  PLT 172 170 198 231 199   Basic Metabolic Panel: Recent Labs  Lab 05/11/23 0800 05/12/23 0809 05/13/23 0610 05/15/23 0900 05/17/23 0932  NA 135 137 136 140 138  K 4.3 5.0 4.1 5.3* 5.0  CL 96* 99 99 100 99  CO2 22 21* 24 21* 21*  GLUCOSE 175* 234* 151* 106* 222*  BUN 75* 98* 60* 109* 96*  CREATININE 7.12* 8.58* 5.69* 9.15* 7.49*  CALCIUM 7.6* 7.7* 7.9* 8.3* 7.6*  MG 1.8  --   --   --   --   PHOS 6.1*  --   --  7.7* 7.4*    Studies: No results found.   Scheduled Meds:  apixaban  2.5 mg Oral BID   ascorbic acid  250 mg Oral Daily   bisacodyl  10 mg Oral QHS   budesonide (PULMICORT) nebulizer solution  0.5 mg Nebulization BID   calcium acetate  1,334 mg Oral TID WC   Chlorhexidine Gluconate Cloth  6 each Topical Daily   clotrimazole-betamethasone   Topical BID   dexamethasone  2 mg Oral Daily   dorzolamide-timolol  1 drop Both Eyes Daily   epoetin alfa-epbx (RETACRIT) injection  4,000 Units Intravenous Q M,W,F-HD   feeding supplement (NEPRO CARB STEADY)  237 mL Oral BID BM   guaiFENesin  600 mg Oral BID   insulin aspart  0-5 Units Subcutaneous QHS   insulin aspart  0-9 Units Subcutaneous TID WC   insulin aspart  4 Units Subcutaneous TID WC   insulin glargine-yfgn  5 Units Subcutaneous Daily   ipratropium-albuterol  3 mL Nebulization Q6H   midodrine  10 mg Oral TID WC   multivitamin  1 tablet Oral QHS   neomycin-polymyxin b-dexamethasone  1 Application Right Eye TID   pantoprazole  40 mg Oral BID   polyethylene glycol  17 g Oral BID   QUEtiapine  50 mg Oral BID   rosuvastatin  10 mg Oral Once per day on Monday Wednesday Friday   traZODone  50 mg Oral QHS   Continuous Infusions:  heparin sodium (porcine)      PRN Meds: acetaminophen, albuterol, bisacodyl, dextromethorphan-guaiFENesin,  heparin sodium (porcine), hydrALAZINE, HYDROmorphone **OR** HYDROmorphone (DILAUDID) injection, loperamide, nitroGLYCERIN, ondansetron (ZOFRAN) IV  Time spent: 42 minutes  Author: Arnetha Courser MD [facility] 05/17/2023 5:09 PM  To reach On-call, see care teams to locate the attending and reach out to them via [email or web address]. If 7PM-7AM, please contact night-coverage If you still have difficulty reaching the attending provider, please page the [facility] (Director  on Call) for [facility] on amion for assistance.

## 2023-05-17 NOTE — Evaluation (Signed)
 Occupational Therapy Evaluation Patient Details Name: Kyle Roberson MRN: [id number] DOB: March 17, 1941 Today's Date: 05/17/2023   History of Present Illness   Darrold Bezek is a 83 y.o. male with medical history significant of ESRD-HD (MWF), HTN, HLD, DM, CAD with stent, dCHF, depression with anxiety, BPH, anemia, A-fib on Eliquis, left eye blindness, who presents with cough, shortness of breath, diarrhea, fall. COVID +   Clinical Impressions Mr Read was seen for OT evaluation this date. Prior to hospital admission, pt was MOD I for mobility using RW. Pt lives with spouse. Pt currently requires TOTAL A x2 chair>bed SPT. SETUP + SUPERVISION seated self-feeding with good sitting tolerance. MAX A x2 sit<>stand, TOTAL A x2 + RW lateral scoot along EOB. Pt would benefit from skilled OT to address noted impairments and functional limitations (see below for any additional details). Upon hospital discharge, recommend 3 session trial and OT follow up <3 hours/day.    If plan is discharge home, recommend the following:   Two people to help with bathing/dressing/bathroom;Two people to help with walking and/or transfers;Assistance with cooking/housework;Assist for transportation;Help with stairs or ramp for entrance     Functional Status Assessment   Patient has had a recent decline in their functional status and demonstrates the ability to make significant improvements in function in a reasonable and predictable amount of time.     Equipment Recommendations   Other (comment) (defer)     Recommendations for Other Services         Precautions/Restrictions   Precautions Precautions: Fall Recall of Precautions/Restrictions: Impaired Restrictions Weight Bearing Restrictions Per Provider Order: No     Mobility Bed Mobility Overal bed mobility: Needs Assistance Bed Mobility: Sit to Supine       Sit to supine: Mod assist        Transfers Overall transfer level: Needs  assistance Equipment used: Rolling walker (2 wheels) Transfers: Sit to/from Stand, Bed to chair/wheelchair/BSC Sit to Stand: Max assist, +2 physical assistance Stand pivot transfers: Total assist, +2 physical assistance        Lateral/Scoot Transfers: Total assist, +2 physical assistance General transfer comment: STS from EOB with Max A +2 and RW; cues for hand placement. Poor posture, difficulting getting full upright. Pt with limited tolerance requested to sit back down. Attempt to take some lateral steps alongside bed patient unable to clear feet.      Balance Overall balance assessment: History of Falls, Needs assistance Sitting-balance support: Feet supported, Bilateral upper extremity supported Sitting balance-Leahy Scale: Fair     Standing balance support: Bilateral upper extremity supported, During functional activity, Reliant on assistive device for balance Standing balance-Leahy Scale: Poor                             ADL either performed or assessed with clinical judgement   ADL Overall ADL's : Needs assistance/impaired                                       General ADL Comments: MAX A for LB access in sitting. SETUP + SUPERVISION seated self-feeding. TOTAL A x2 + RW for simulated BSC t/f      Pertinent Vitals/Pain Pain Assessment Pain Assessment: Faces Faces Pain Scale: Hurts whole lot Pain Location: LEs with any movement/touch. Yelling out/swearing Pain Descriptors / Indicators: Discomfort, Sore Pain Intervention(s): Limited activity within patient's tolerance, Repositioned  Extremity/Trunk Assessment Upper Extremity Assessment Upper Extremity Assessment: Generalized weakness   Lower Extremity Assessment Lower Extremity Assessment: Generalized weakness RLE: Unable to fully assess due to pain LLE: Unable to fully assess due to pain       Communication Communication Communication: No apparent difficulties   Cognition  Arousal: Alert Behavior During Therapy: Agitated                                 Following commands: Intact       Cueing  General Comments   Cueing Techniques: Verbal cues      Exercises     Shoulder Instructions      Home Living Family/patient expects to be discharged to:: Private residence Living Arrangements: Spouse/significant other Available Help at Discharge: Family;Available 24 hours/day Type of Home: House Home Access: Ramped entrance     Home Layout: One level     Bathroom Shower/Tub: Producer, television/film/video: Standard Bathroom Accessibility: Yes   Home Equipment: Cane - quad;Shower seat;Grab bars - toilet;Grab bars - tub/shower;Hand held shower head;Wheelchair Financial trader (4 wheels)          Prior Functioning/Environment Prior Level of Function : Needs assist;History of Falls (last six months)             Mobility Comments: at baseline does not go out of house for mobility unless needed ADLs Comments: Wife assists with ADL (min-modA)    OT Problem List: Decreased strength;Decreased range of motion;Decreased activity tolerance;Impaired balance (sitting and/or standing);Impaired vision/perception;Decreased knowledge of use of DME or AE;Pain;Increased edema   OT Treatment/Interventions: Self-care/ADL training;Therapeutic exercise;Energy conservation;DME and/or AE instruction;Therapeutic activities;Patient/family education      OT Goals(Current goals can be found in the care plan section)   Acute Rehab OT Goals Patient Stated Goal: to go home OT Goal Formulation: With patient/family Time For Goal Achievement: 05/31/23 Potential to Achieve Goals: Fair ADL Goals Pt Will Perform Grooming: with modified independence;sitting Pt Will Perform Lower Body Dressing: with mod assist;with adaptive equipment;with caregiver independent in assisting;sitting/lateral leans Pt Will Transfer to Toilet: with min assist;stand pivot  transfer;bedside commode   OT Frequency:  Min 1X/week    Co-evaluation PT/OT/SLP Co-Evaluation/Treatment: Yes Reason for Co-Treatment: Necessary to address cognition/behavior during functional activity;For patient/therapist safety;To address functional/ADL transfers PT goals addressed during session: Mobility/safety with mobility OT goals addressed during session: ADL's and self-care      AM-PAC OT "6 Clicks" Daily Activity     Outcome Measure Help from another person eating meals?: None Help from another person taking care of personal grooming?: A Little Help from another person toileting, which includes using toliet, bedpan, or urinal?: A Lot Help from another person bathing (including washing, rinsing, drying)?: A Lot Help from another person to put on and taking off regular upper body clothing?: A Little Help from another person to put on and taking off regular lower body clothing?: A Lot 6 Click Score: 16   End of Session Equipment Utilized During Treatment: Gait belt;Rolling walker (2 wheels) Nurse Communication: Mobility status  Activity Tolerance: Patient limited by pain Patient left: in bed;with call bell/phone within reach;with bed alarm set;with family/visitor present  OT Visit Diagnosis: Other abnormalities of gait and mobility (R26.89);Pain Pain - Right/Left: Right Pain - part of body: Leg                Time: 1410-1435 OT Time Calculation (min): 25 min Charges:  OT General Charges $OT Visit: 1 Visit OT Evaluation $OT Eval Moderate Complexity: 1 Mod OT Treatments $Self Care/Home Management : 8-22 mins  Kathie Dike, M.S. OTR/L  05/17/23, 4:01 PM  ascom [phone]

## 2023-05-18 DIAGNOSIS — U071 COVID-19: Secondary | ICD-10-CM | POA: Diagnosis not present

## 2023-05-18 DIAGNOSIS — I480 Paroxysmal atrial fibrillation: Secondary | ICD-10-CM | POA: Diagnosis not present

## 2023-05-18 DIAGNOSIS — E1122 Type 2 diabetes mellitus with diabetic chronic kidney disease: Secondary | ICD-10-CM | POA: Diagnosis not present

## 2023-05-18 DIAGNOSIS — I25118 Atherosclerotic heart disease of native coronary 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: Secondary | ICD-10-CM | POA: Diagnosis not present

## 2023-05-18 LAB — GLUCOSE, CAPILLARY
Glucose-Capillary: 137 mg/dL — ABNORMAL HIGH (ref 70–99)
Glucose-Capillary: 196 mg/dL — ABNORMAL HIGH (ref 70–99)
Glucose-Capillary: 196 mg/dL — ABNORMAL HIGH (ref 70–99)
Glucose-Capillary: 268 mg/dL — ABNORMAL HIGH (ref 70–99)

## 2023-05-18 NOTE — Progress Notes (Signed)
 [facility] Progress Note  Patient: Kyle Roberson    WUJ:[id number]  DOA: 05/05/2023     Date of Service: the patient was seen and examined on 05/18/2023  Chief Complaint  Patient presents with   Fall   Brief hospital course:  Kyle Roberson is a 83 y.o. male with medical history significant of ESRD-HD (MWF), HTN, HLD, DM, CAD with stent, dCHF, depression with anxiety, BPH, anemia, A-fib on Eliquis, left eye blindness, who presents with cough, shortness of breath, diarrhea, fall.   Per pt and his wife (I called his wife by phone), patient has productive cough, sore throat, SOB, malaise for more than 5 days.  No chest pain.  No fever or chills.  Patient has diarrhea for more than 10 days, with 2- 3 times of watery diarrhea each day.  No nausea, vomiting or abdominal pain.  Patient fell this morning, no loss of consciousness. He patient states that he was going to the bathroom when he suddenly felt weak in his gave out and fell.  No unilateral numbness or tingling in extremities.  No facial droop or slurred speech.  Patient states that he has missed several dialysis. His last dialysis was on Monday.     Data reviewed independently and ED Course: pt was found to have positive COVID 19, WBC 5.6, troponin 95 --> 105, potassium 4.7, bicarbonate 18, creatinine 10.02, BUN 102.  CT head negative. Chest x-ray showed cardiomegaly with vascular congestion.  Patient is placed on telemetry bed for observation.     EKG: I have personally reviewed.  A-fib, QTc 461, low voltage, poor IV progression, anteroseptal infarction pattern.  2/22: Hemodynamically stable, getting dialysis.  Lengthy discussion with wife on phone to make decision regarding SNF versus home health, patient does not want to go to rehab himself and becoming agitated and combative. Psych was also consulted for capacity evaluation-pending. Currently being managed with as needed Haldol.  2/23; yesterday evening patient was  transferred to stepdown due to softer blood pressure which shows initial poor response to IV fluid.  Later blood pressure started improving with addition of midodrine.  Patient is now at baseline and remained quite combative and rude, refusing most of the care.  Being transferred out of stepdown.  2/24: Patient had dialysis today.  Behavior remained the same.  Patient does not have any capacity to make any medical decision per psychiatry today.  Wife would like him to go to rehab.  TOC to start looking for a place.  2/25: Remained medically stable.  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 nightly and Seroquel 25 mg p.o. twice daily 2/20 psych consulted for further recommendation Patient remained agitated and eventually psych was able to see him today, patient does not have any capacity -Continue as needed Haldol  # COVID-19 virus infection: No fever, no leukocytosis, no oxygen desaturation.   Chest x-ray showed vascular congestion, no infiltration. -Bronchodilators, Mucinex 600 mg p.o. twice daily, Robitussin DM as needed for cough -Continue supportive care 2/15 still sob after HD, may benefit from steroids and breathing treatments S/p Prednisone 40 mg p.o. daily x 3 days, Breo Ellipta inhaler Continue albuterol as needed 2/16 patient is refusing inhalers, started on nebulizer treatments if he agrees. 2/18 started Decadron 6 mg p.o. daily x 4 days til 2/21 2/21 decreased Decadron 4 mg p.o. daily for 2 days followed by 2 mg p.o.  daily for 2 days due to mental status changes.  Steroids may be contributing. Patient is refusing inhalers   # CAD (coronary artery disease) and Myocardial injury:  Troponin 894, but No chest pain and no significant EKG changes Continue to monitor on telemetry S/p ASA 81 mg and heparin IV infusion, discontinued on 2/14, due to GI  bleed 2/14 continue to hold Eliquis for now Cardiology consulted, recommended no ischemic workup at this time, follow as an outpatient for Myoview when recovered after COVID infection TTE shows LVEF 55 to 60%, grade 2 diastolic dysfunction, no wall motion abnormality.  Severe pulmonary hypertension, RA moderately dilated.  Moderate TR As per cardio patient may need right and left heart cath due to pulmonary hypertension and elevated troponin.  # Paroxysmal atrial fibrillation: -Tele monitoring - s/p heparin IV infusion, Hb dropped, suspected GI bleed.  Patient was cleared by GI to continue heparin IV infusion.  2/15 resumed Eliquis 2.5 mg p.o. 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 Current hemoglobin seems stable around 9.5   # ESRD on dialysis -consulted nephrology Continue hemodialysis MWF schedule Hyperphosphatemia, started PhosLo   # Fall at home, initial encounter: CT-head negative -Fall precaution, CK level wnl -PT/OT -recommending SNF but patient is refusing  # Type II diabetes mellitus with renal manifestations:  Recent A1c 7.3, poorly controlled for patient taking glipizide at home -SSI 2/18 started Semglee 5 units subcu daily and NovoLog 4 units 3 times daily due to hyperglycemia secondary to steroids. Follow diabetic coordinator and titrate insulin dose accordingly. Monitor CBG and continue diabetic diet   # Essential hypertension: Patient's not taking medications.   Nonsudden V. tach episode 7 beats run at 630 on 2/13 reported 2/13 started metoprolol 12.5 mg p.o. 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 Difficile negative Now patient developed constipation.  # Constipation, started laxatives on 2/19 2/21 started Dulcolax p.o. Use suppository and enema as needed  Body mass index is 29.53 kg/m.  Interventions:  Diet: Renal diet DVT Prophylaxis: Heparin IV infusion  Advance goals of care discussion: Full code  Family Communication:   Disposition:  Patient likely needs SNF due to worsening functional status but declining.  A lot of behavioral issues   Subjective: Patient was unusually calm and not cursing.  Denies any new concern.  Physical Exam: General.  Frail elderly man, in no acute distress. Pulmonary.  Lungs clear bilaterally, normal respiratory effort. CV.  Regular rate and rhythm, no JVD, rub or murmur. Abdomen.  Soft, nontender, nondistended, BS positive. CNS.  Alert and oriented .  No focal neurologic deficit. Extremities.  No edema, pulses intact and symmetrical.    Vitals:   05/18/23 0427 05/18/23 0427 05/18/23 0739 05/18/23 0803  BP:  (!) 133/55  (!) 139/57  Pulse:  (!) 58  61  Resp:  19  (!) 22  Temp:  97.7 F (36.5 C)  98 F (36.7 C)  TempSrc:  Oral  Oral  SpO2:  94% 99% 99%  Weight: 90.7 kg     Height:       No intake or output data in the 24 hours ending 05/18/23 1549   Filed Weights   05/17/23 0441 05/17/23 1450 05/18/23 0427  Weight: 90.9 kg 90.1 kg 90.7 kg    Data Reviewed: Prior data reviewed.  CBC: Recent Labs  Lab 05/12/23 0809 05/13/23 0610 05/15/23 0900 05/17/23 0932  WBC 7.5 9.8 13.5* 10.6*  NEUTROABS  --   --   --  9.1*  HGB 8.7* 9.8* 9.5* 9.1*  HCT 26.3* 29.0* 28.9* 27.9*  MCV 95.6 95.4 96.7 97.2   PLT 170 198 231 199   Basic Metabolic Panel: Recent Labs  Lab 05/12/23 0809 05/13/23 0610 05/15/23 0900 05/17/23 0932  NA 137 136 140 138  K 5.0 4.1 5.3* 5.0  CL 99 99 100 99  CO2 21* 24 21* 21*  GLUCOSE 234* 151* 106* 222*  BUN 98* 60* 109* 96*  CREATININE 8.58* 5.69* 9.15* 7.49*  CALCIUM 7.7* 7.9* 8.3* 7.6*  PHOS  --   --  7.7* 7.4*    Studies: No results found.   Scheduled Meds:  apixaban  2.5 mg Oral BID   ascorbic acid  250 mg Oral Daily   bisacodyl  10 mg Oral QHS   budesonide (PULMICORT) nebulizer solution  0.5 mg Nebulization BID   calcium acetate  1,334 mg Oral TID WC   Chlorhexidine Gluconate Cloth  6 each Topical Daily   clotrimazole-betamethasone   Topical BID   dorzolamide-timolol  1 drop Both Eyes Daily   epoetin alfa-epbx (RETACRIT) injection  4,000 Units Intravenous Q M,W,F-HD   feeding supplement (NEPRO CARB STEADY)  237 mL Oral BID BM   guaiFENesin  600 mg Oral BID   insulin aspart  0-5 Units Subcutaneous QHS   insulin aspart  0-9 Units Subcutaneous TID WC   insulin aspart  4 Units Subcutaneous TID WC   insulin glargine-yfgn  5 Units Subcutaneous Daily   ipratropium-albuterol  3 mL Nebulization BID   midodrine  10 mg Oral TID WC   multivitamin  1 tablet Oral QHS   neomycin-polymyxin b-dexamethasone  1 Application Right Eye TID   pantoprazole  40 mg Oral BID   polyethylene glycol  17 g Oral BID   QUEtiapine  50 mg Oral BID   rosuvastatin  10 mg Oral Once per day on Monday Wednesday Friday   traZODone  50 mg Oral QHS   Continuous Infusions:    PRN Meds: acetaminophen, albuterol, bisacodyl, dextromethorphan-guaiFENesin, hydrALAZINE, HYDROmorphone **OR** HYDROmorphone (DILAUDID) injection, loperamide, nitroGLYCERIN, ondansetron (ZOFRAN) IV  Time spent: 40 minutes  Author: Arnetha Courser MD [facility] 05/18/2023 3:49 PM  To reach On-call, see care teams to locate the attending and reach out to them via [email or web address]. If 7PM-7AM, please  contact night-coverage If you still have difficulty reaching the attending provider, please page the [facility] (Director on Call) for [facility] on amion for assistance.

## 2023-05-18 NOTE — Progress Notes (Signed)
 [facility]  ROUNDING NOTE   Subjective:   Kyle Roberson is a 83 y.o. male with diabetes mellitus type II, hypertension, coronary artery disease, chronic diastolic congestive heart failure, atrial fibrillation, pulmonary hypertension, and historyof subdural hematoma who is admitted for Weakness [R53.1] Fall, initial encounter [W19.XXXA] COVID-19 virus infection [U07.1] COVID-19 [U07.1] Uremia of renal origin [N19]  Patient is known to our practice and receives outpatient dialysis at [facility] on a MWF, supervised by Dr Cherylann Ratel. He received his last treatment on Wednesday.   Update:  Patient seen sitting up in bed Currently eating breakfast Alert and oriented Encouraged by attending nephrologist to participate with physical therapy to determine discharge needs   Objective:  Vital signs in last 24 hours:  Temp:  [97.6 F (36.4 C)-98.5 F (36.9 C)] 98 F (36.7 C) (02/25 0803) Pulse Rate:  [58-70] 61 (02/25 0803) Resp:  [19-29] 22 (02/25 0803) BP: (113-159)/(55-73) 139/57 (02/25 0803) SpO2:  [93 %-99 %] 99 % (02/25 0803) Weight:  [90.1 kg-90.7 kg] 90.7 kg (02/25 0427)  Weight change: -0.816 kg Filed Weights   05/17/23 0441 05/17/23 1450 05/18/23 0427  Weight: 90.9 kg 90.1 kg 90.7 kg    Intake/Output: I/O last 3 completed shifts: In: -  Out: 2000 [Other:2000]   Intake/Output this shift:  No intake/output data recorded.  Physical Exam: General: NAD  Head: Normocephalic, atraumatic. Moist oral mucosal membranes  Eyes: Anicteric  Lungs:  Clear to auscultation, room air  Heart: Regular rate and rhythm  Abdomen:  Soft, nontender  Extremities: 1+ peripheral edema.  Neurologic: Alert and oriented, moving all four extremities  Skin: Scaly bilateral lower extremities  Access: Right chest tunneled access    Basic Metabolic Panel: Recent Labs  Lab 05/12/23 0809 05/13/23 0610 05/15/23 0900 05/17/23 0932  NA 137 136 140 138  K 5.0 4.1 5.3* 5.0   CL 99 99 100 99  CO2 21* 24 21* 21*  GLUCOSE 234* 151* 106* 222*  BUN 98* 60* 109* 96*  CREATININE 8.58* 5.69* 9.15* 7.49*  CALCIUM 7.7* 7.9* 8.3* 7.6*  PHOS  --   --  7.7* 7.4*    Liver Function Tests: Recent Labs  Lab 05/15/23 0900 05/17/23 0932  ALBUMIN 2.6* 2.4*    No results for input(s): "LIPASE", "AMYLASE" in the last 168 hours.  No results for input(s): "AMMONIA" in the last 168 hours.  CBC: Recent Labs  Lab 05/12/23 0809 05/13/23 0610 05/15/23 0900 05/17/23 0932  WBC 7.5 9.8 13.5* 10.6*  NEUTROABS  --   --   --  9.1*  HGB 8.7* 9.8* 9.5* 9.1*  HCT 26.3* 29.0* 28.9* 27.9*  MCV 95.6 95.4 96.7 97.2  PLT 170 198 231 199    Cardiac Enzymes: No results for input(s): "CKTOTAL", "CKMB", "CKMBINDEX", "TROPONINI" in the last 168 hours.   BNP: Invalid input(s): "POCBNP"  CBG: Recent Labs  Lab 05/17/23 1358 05/17/23 1755 05/17/23 1954 05/18/23 0827 05/18/23 1125  GLUCAP 130* 216* 257* 137* 196*    Microbiology: Results for orders placed or performed during the hospital encounter of 05/05/23  Resp panel by RT-PCR (RSV, Flu A&B, Covid) Anterior Nasal Swab     Status: Abnormal   Collection Time: 05/05/23  4:33 PM   Specimen: Anterior Nasal Swab  Result Value Ref Range Status   SARS Coronavirus 2 by RT PCR POSITIVE (A) NEGATIVE Final    Comment: (NOTE) SARS-CoV-2 target nucleic acids are DETECTED.  The SARS-CoV-2 RNA is generally detectable in upper respiratory specimens  during the acute phase of infection. Positive results are indicative of the presence of the identified virus, but do not rule out bacterial infection or co-infection with other pathogens not detected by the test. Clinical correlation with patient history and other diagnostic information is necessary to determine patient infection status. The expected result is Negative.  Fact Sheet for Patients: BloggerCourse.com  Fact Sheet for Healthcare  Providers: SeriousBroker.it  This test is not yet approved or cleared by the [place] FDA and  has been authorized for detection and/or diagnosis of SARS-CoV-2 by FDA under an Emergency Use Authorization (EUA).  This EUA will remain in effect (meaning this test can be used) for the duration of  the COVID-19 declaration under Section 564(b)(1) of the A ct, 21 U.S.C. section 360bbb-3(b)(1), unless the authorization is terminated or revoked sooner.     Influenza A by PCR NEGATIVE NEGATIVE Final   Influenza B by PCR NEGATIVE NEGATIVE Final    Comment: (NOTE) The Xpert Xpress SARS-CoV-2/FLU/RSV plus assay is intended as an aid in the diagnosis of influenza from Nasopharyngeal swab specimens and should not be used as a sole basis for treatment. Nasal washings and aspirates are unacceptable for Xpert Xpress SARS-CoV-2/FLU/RSV testing.  Fact Sheet for Patients: BloggerCourse.com  Fact Sheet for Healthcare Providers: SeriousBroker.it  This test is not yet approved or cleared by the [place] FDA and has been authorized for detection and/or diagnosis of SARS-CoV-2 by FDA under an Emergency Use Authorization (EUA). This EUA will remain in effect (meaning this test can be used) for the duration of the COVID-19 declaration under Section 564(b)(1) of the Act, 21 U.S.C. section 360bbb-3(b)(1), unless the authorization is terminated or revoked.     Resp Syncytial Virus by PCR NEGATIVE NEGATIVE Final    Comment: (NOTE) Fact Sheet for Patients: BloggerCourse.com  Fact Sheet for Healthcare Providers: SeriousBroker.it  This test is not yet approved or cleared by the [place] FDA and has been authorized for detection and/or diagnosis of SARS-CoV-2 by FDA under an Emergency Use Authorization (EUA). This EUA will remain in effect (meaning this test can be  used) for the duration of the COVID-19 declaration under Section 564(b)(1) of the Act, 21 U.S.C. section 360bbb-3(b)(1), unless the authorization is terminated or revoked.  Performed at [facility], [address] [place], [place] [address]   C Difficile Quick Screen w PCR reflex     Status: None   Collection Time: 05/05/23  7:35 PM   Specimen: STOOL  Result Value Ref Range Status   C Diff antigen NEGATIVE NEGATIVE Final   C Diff toxin NEGATIVE NEGATIVE Final   C Diff interpretation No C. difficile detected.  Final    Comment: Performed at [facility], [address] [place], [place] [address]  MRSA Next Gen by PCR, Nasal     Status: None   Collection Time: 05/15/23  6:37 PM   Specimen: Nasal Mucosa; Nasal Swab  Result Value Ref Range Status   MRSA by PCR Next Gen NOT DETECTED NOT DETECTED Final    Comment: (NOTE) The GeneXpert MRSA Assay (FDA approved for NASAL specimens only), is one component of a comprehensive MRSA colonization surveillance program. It is not intended to diagnose MRSA infection nor to guide or monitor treatment for MRSA infections. Test performance is not FDA approved in patients less than 25 years old. Performed at [facility], [address], [place], [place] [address]     Coagulation Studies: No results for input(s): "  LABPROT", "INR" in the last 72 hours.   Urinalysis: No results for input(s): "COLORURINE", "LABSPEC", "PHURINE", "GLUCOSEU", "HGBUR", "BILIRUBINUR", "KETONESUR", "PROTEINUR", "UROBILINOGEN", "NITRITE", "LEUKOCYTESUR" in the last 72 hours.  Invalid input(s): "APPERANCEUR"    Imaging: No results found.    Medications:    heparin sodium (porcine)       apixaban  2.5 mg Oral BID   ascorbic acid  250 mg Oral Daily   bisacodyl  10 mg Oral QHS   budesonide (PULMICORT) nebulizer solution  0.5 mg Nebulization BID   calcium acetate  1,334 mg Oral TID WC   Chlorhexidine Gluconate Cloth  6 each  Topical Daily   clotrimazole-betamethasone   Topical BID   dorzolamide-timolol  1 drop Both Eyes Daily   epoetin alfa-epbx (RETACRIT) injection  4,000 Units Intravenous Q M,W,F-HD   feeding supplement (NEPRO CARB STEADY)  237 mL Oral BID BM   guaiFENesin  600 mg Oral BID   insulin aspart  0-5 Units Subcutaneous QHS   insulin aspart  0-9 Units Subcutaneous TID WC   insulin aspart  4 Units Subcutaneous TID WC   insulin glargine-yfgn  5 Units Subcutaneous Daily   ipratropium-albuterol  3 mL Nebulization BID   midodrine  10 mg Oral TID WC   multivitamin  1 tablet Oral QHS   neomycin-polymyxin b-dexamethasone  1 Application Right Eye TID   pantoprazole  40 mg Oral BID   polyethylene glycol  17 g Oral BID   QUEtiapine  50 mg Oral BID   rosuvastatin  10 mg Oral Once per day on Monday Wednesday Friday   traZODone  50 mg Oral QHS   acetaminophen, albuterol, bisacodyl, dextromethorphan-guaiFENesin, heparin sodium (porcine), hydrALAZINE, HYDROmorphone **OR** HYDROmorphone (DILAUDID) injection, loperamide, nitroGLYCERIN, ondansetron (ZOFRAN) IV  Assessment/ Plan:  Mr. Attilio Zeitler is a 83 y.o.  male with diabetes mellitus type II, hypertension, coronary artery disease, chronic diastolic congestive heart failure, atrial fibrillation, pulmonary hypertension, and historyof subdural hematoma who is admitted for Weakness [R53.1] Fall, initial encounter [W19.XXXA] COVID-19 virus infection [U07.1] COVID-19 [U07.1] Uremia of renal origin [N19]  [facility]/MWF/right chest PermCath  End-stage renal disease on hemodialysis.  Patient received dialysis yesterday, seated in chair.  Tolerated well.  UF 2 L achieved.  Next treatment scheduled for Wednesday.  COVID-19, found positive this admission.  Isolation discontinued on 05/15/23.  Remains on room air.  Continue supportive care.  3. Anemia of chronic kidney disease  Lab Results  Component Value Date   HGB 9.1 (L) 05/17/2023    Patient  receives Mircera at outpatient clinic. Hemoglobin within desired range.  Continue low dose EPO with dialysis.   4. Secondary Hyperparathyroidism: with outpatient labs: None available  Lab Results  Component Value Date   PTH 344 (H) 01/07/2023   CALCIUM 7.6 (L) 05/17/2023   CAION 1.14 (L) 12/02/2022   PHOS 7.4 (H) 05/17/2023    Corrected calcium 8.9.  Continue binders as ordered with meals to manage hyperphosphatemia.     LOS: 11 Sander Speckman 2/25/202512:30 PM

## 2023-05-18 NOTE — TOC Progression Note (Signed)
 Transition of Care [facility]) - Progression Note    Patient Details  Name: Perle Gibbon MRN: [id number] Date of Birth: [birth year]  Transition of Care [facility]) CM/SW Contact  Chapman Fitch, RN Phone Number: 05/18/2023, 3:43 PM  Clinical Narrative:     Spoke with wife [name], and provided bed offers.  She is to review   Expected Discharge Plan: Home/Self Care Barriers to Discharge: Continued Medical Work up  Expected Discharge Plan and Services   Discharge Planning Services: CM Consult   Living arrangements for the past 2 months: Single Family Home                 DME Arranged: N/A DME Agency: NA       HH Arranged: Refused SNF, Patient Refused HH HH Agency: NA         Social Determinants of Health (SDOH) Interventions SDOH Screenings   Food Insecurity: No Food Insecurity (05/07/2023)  Housing: Low Risk  (05/08/2023)  Transportation Needs: No Transportation Needs (05/08/2023)  Utilities: Not At Risk (05/08/2023)  Depression (PHQ2-9): Low Risk  (07/27/2022)  Tobacco Use: Medium Risk (05/05/2023)    Readmission Risk Interventions    05/12/2023   10:07 AM  Readmission Risk Prevention Plan  Transportation Screening Complete  Medication Review (RN Care Manager) Referral to Pharmacy  PCP or Specialist appointment within 3-5 days of discharge Complete  HRI or Home Care Consult Complete  Palliative Care Screening Not Applicable  Skilled Nursing Facility Not Applicable

## 2023-05-19 DIAGNOSIS — I480 Paroxysmal atrial fibrillation: Secondary | ICD-10-CM | POA: Diagnosis not present

## 2023-05-19 DIAGNOSIS — E1122 Type 2 diabetes mellitus with diabetic chronic kidney disease: Secondary | ICD-10-CM | POA: Diagnosis not present

## 2023-05-19 DIAGNOSIS — U071 COVID-19: Secondary | ICD-10-CM | POA: Diagnosis not present

## 2023-05-19 DIAGNOSIS — I25118 Atherosclerotic heart disease of native coronary 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: Secondary | ICD-10-CM | POA: Diagnosis not present

## 2023-05-19 LAB — CBC
HCT: 27.8 % — ABNORMAL LOW (ref 39.0–52.0)
Hemoglobin: 8.9 g/dL — ABNORMAL LOW (ref 13.0–17.0)
MCH: 31.3 pg (ref 26.0–34.0)
MCHC: 32 g/dL (ref 30.0–36.0)
MCV: 97.9 fL (ref 80.0–100.0)
Platelets: 207 10*3/uL (ref 150–400)
RBC: 2.84 MIL/uL — ABNORMAL LOW (ref 4.22–5.81)
RDW: 13.6 % (ref 11.5–15.5)
WBC: 11.4 10*3/uL — ABNORMAL HIGH (ref 4.0–10.5)
nRBC: 0 % (ref 0.0–0.2)

## 2023-05-19 LAB — RENAL FUNCTION PANEL
Albumin: 2.5 g/dL — ABNORMAL LOW (ref 3.5–5.0)
Anion gap: 14 (ref 5–15)
BUN: 71 mg/dL — ABNORMAL HIGH (ref 8–23)
CO2: 24 mmol/L (ref 22–32)
Calcium: 7.9 mg/dL — ABNORMAL LOW (ref 8.9–10.3)
Chloride: 97 mmol/L — ABNORMAL LOW (ref 98–111)
Creatinine, Ser: 6.51 mg/dL — ABNORMAL HIGH (ref 0.61–1.24)
GFR, Estimated: 8 mL/min — ABNORMAL LOW (ref >60)
Glucose, Bld: 106 mg/dL — ABNORMAL HIGH (ref 70–99)
Phosphorus: 4.7 mg/dL — ABNORMAL HIGH (ref 2.5–4.6)
Potassium: 4.4 mmol/L (ref 3.5–5.1)
Sodium: 135 mmol/L (ref 135–145)

## 2023-05-19 LAB — GLUCOSE, CAPILLARY
Glucose-Capillary: 83 mg/dL (ref 70–99)
Glucose-Capillary: 142 mg/dL — ABNORMAL HIGH (ref 70–99)
Glucose-Capillary: 167 mg/dL — ABNORMAL HIGH (ref 70–99)
Glucose-Capillary: 162 mg/dL — ABNORMAL HIGH (ref 70–99)

## 2023-05-19 MED ORDER — HEPARIN SODIUM (PORCINE) 1000 UNIT/ML DIALYSIS: 1000.0000 [IU] | INTRAMUSCULAR | Status: DC | PRN

## 2023-05-19 MED ORDER — ALTEPLASE 2 MG IJ SOLR: 2.0000 mg | Freq: Once | INTRAMUSCULAR | Status: DC | PRN

## 2023-05-19 MED ORDER — EPOETIN ALFA-EPBX 4000 UNIT/ML IJ SOLN
INTRAMUSCULAR | Status: AC
  Filled 2023-05-19: qty 1

## 2023-05-19 MED ORDER — HEPARIN SODIUM (PORCINE) 1000 UNIT/ML IJ SOLN
INTRAMUSCULAR | Status: AC
Start: 2023-05-19 — End: ?
  Filled 2023-05-19: qty 10

## 2023-05-19 MED ORDER — CALCIUM ACETATE (PHOS BINDER) 667 MG PO CAPS
2001.0000 mg | ORAL_CAPSULE | Freq: Three times a day (TID) | ORAL | Status: DC
  Administered 2023-05-19 – 2023-05-20 (×2): 2001 mg via ORAL
  Filled 2023-05-19 (×5): qty 3

## 2023-05-19 NOTE — Progress Notes (Signed)
 PT Cancellation Note  Patient Details Name: Kyle Roberson MRN: [id number] DOB: [birth year]   Cancelled Treatment:    Reason Eval/Treat Not Completed: Patient at procedure or test/unavailable. Patient in HD this am. Will re-attempt later as able.    Abdulloh Ullom 05/19/2023, 8:41 AM

## 2023-05-19 NOTE — TOC Progression Note (Addendum)
 Transition of Care [facility]) - Progression Note    Patient Details  Name: Kyle Roberson MRN: [id number] Date of Birth: 06-11-1940  Transition of Care [facility]) CM/SW Contact  Chapman Fitch, RN Phone Number: 05/19/2023, 9:18 AM  Clinical Narrative:     Message left for wife Kyle Roberson requesting return call to discuss bed offers   Update:  wife would like referral sent to [facility] in [place].  Referral sent in HUB and message left for admissions coordinator  Expected Discharge Plan: Home/Self Care Barriers to Discharge: Continued Medical Work up  Expected Discharge Plan and Services   Discharge Planning Services: CM Consult   Living arrangements for the past 2 months: Single Family Home                 DME Arranged: N/A DME Agency: NA       HH Arranged: Refused SNF, Patient Refused HH HH Agency: NA         Social Determinants of Health (SDOH) Interventions SDOH Screenings   Food Insecurity: No Food Insecurity (05/07/2023)  Housing: Low Risk  (05/08/2023)  Transportation Needs: No Transportation Needs (05/08/2023)  Utilities: Not At Risk (05/08/2023)  Depression (PHQ2-9): Low Risk  (07/27/2022)  Tobacco Use: Medium Risk (05/05/2023)    Readmission Risk Interventions    05/12/2023   10:07 AM  Readmission Risk Prevention Plan  Transportation Screening Complete  Medication Review (RN Care Manager) Referral to Pharmacy  PCP or Specialist appointment within 3-5 days of discharge Complete  HRI or Home Care Consult Complete  Palliative Care Screening Not Applicable  Skilled Nursing Facility Not Applicable

## 2023-05-19 NOTE — Progress Notes (Signed)
 [facility] Progress Note  Patient: Kyle Roberson    WUJ:[id number]  DOA: 05/05/2023     Date of Service: the patient was seen and examined on 05/19/2023  Chief Complaint  Patient presents with   Fall   Brief hospital course:  Kyle Roberson is a 83 y.o. male with medical history significant of ESRD-HD (MWF), HTN, HLD, DM, CAD with stent, dCHF, depression with anxiety, BPH, anemia, A-fib on Eliquis, left eye blindness, who presents with cough, shortness of breath, diarrhea, fall.   Per pt and his wife (I called his wife by phone), patient has productive cough, sore throat, SOB, malaise for more than 5 days.  No chest pain.  No fever or chills.  Patient has diarrhea for more than 10 days, with 2- 3 times of watery diarrhea each day.  No nausea, vomiting or abdominal pain.  Patient fell this morning, no loss of consciousness. He patient states that he was going to the bathroom when he suddenly felt weak in his gave out and fell.  No unilateral numbness or tingling in extremities.  No facial droop or slurred speech.  Patient states that he has missed several dialysis. His last dialysis was on Monday.     Data reviewed independently and ED Course: pt was found to have positive COVID 19, WBC 5.6, troponin 95 --> 105, potassium 4.7, bicarbonate 18, creatinine 10.02, BUN 102.  CT head negative. Chest x-ray showed cardiomegaly with vascular congestion.  Patient is placed on telemetry bed for observation.     EKG: I have personally reviewed.  A-fib, QTc 461, low voltage, poor IV progression, anteroseptal infarction pattern.  2/22: Hemodynamically stable, getting dialysis.  Lengthy discussion with wife on phone to make decision regarding SNF versus home health, patient does not want to go to rehab himself and becoming agitated and combative. Psych was also consulted for capacity evaluation-pending. Currently being managed with as needed Haldol.  2/23; yesterday evening patient was  transferred to stepdown due to softer blood pressure which shows initial poor response to IV fluid.  Later blood pressure started improving with addition of midodrine.  Patient is now at baseline and remained quite combative and rude, refusing most of the care.  Being transferred out of stepdown.  2/24: Patient had dialysis today.  Behavior remained the same.  Patient does not have any capacity to make any medical decision per psychiatry today.  Wife would like him to go to rehab.  TOC to start looking for a place.  2/25: Remained medically stable.  Awaiting SNF placement  2/26: Had dialysis today.  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 nightly and Seroquel 25 mg p.o. twice daily 2/20 psych consulted for further recommendation Patient remained agitated and eventually psych was able to see him today, patient does not have any capacity -Continue as needed Haldol  # COVID-19 virus infection: No fever, no leukocytosis, no oxygen desaturation.   Chest x-ray showed vascular congestion, no infiltration. -Bronchodilators, Mucinex 600 mg p.o. twice daily, Robitussin DM as needed for cough -Continue supportive care 2/15 still sob after HD, may benefit from steroids and breathing treatments S/p Prednisone 40 mg p.o. daily x 3 days, Breo Ellipta inhaler Continue albuterol as needed 2/16 patient is refusing inhalers, started on nebulizer treatments if he agrees. 2/18 started Decadron 6 mg p.o. daily x 4 days til 2/21 2/21 decreased Decadron 4 mg p.o. daily  for 2 days followed by 2 mg p.o. daily for 2 days due to mental status changes.  Steroids may be contributing. Patient is refusing inhalers   # CAD (coronary artery disease) and Myocardial injury:  Troponin 894, but No chest pain and no significant EKG changes Continue to monitor on telemetry S/p ASA 81 mg and heparin IV  infusion, discontinued on 2/14, due to GI bleed 2/14 continue to hold Eliquis for now Cardiology consulted, recommended no ischemic workup at this time, follow as an outpatient for Myoview when recovered after COVID infection TTE shows LVEF 55 to 60%, grade 2 diastolic dysfunction, no wall motion abnormality.  Severe pulmonary hypertension, RA moderately dilated.  Moderate TR As per cardio patient may need right and left heart cath due to pulmonary hypertension and elevated troponin.  # Paroxysmal atrial fibrillation: -Tele monitoring - s/p heparin IV infusion, Hb dropped, suspected GI bleed.  Patient was cleared by GI to continue heparin IV infusion.  2/15 resumed Eliquis 2.5 mg p.o. 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 Current hemoglobin seems stable around 9.5   # ESRD on dialysis -consulted nephrology Continue hemodialysis MWF schedule Hyperphosphatemia, started PhosLo   # Fall at home, initial encounter: CT-head negative -Fall precaution, CK level wnl -PT/OT -recommending SNF but patient is refusing  # Type II diabetes mellitus with renal manifestations:  Recent A1c 7.3, poorly controlled for patient taking glipizide at home -SSI 2/18 started Semglee 5 units subcu daily and NovoLog 4 units 3 times daily due to hyperglycemia secondary to steroids. Follow diabetic coordinator and titrate insulin dose accordingly. Monitor CBG and continue diabetic diet   # Essential hypertension: Patient's not taking medications.   Nonsudden V. tach episode 7 beats run at 630 on 2/13 reported 2/13 started metoprolol 12.5 mg p.o. 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 Difficile negative Now patient developed constipation.  # Constipation, started laxatives on 2/19 2/21 started Dulcolax p.o. Use suppository and enema as needed  Body mass index is 28.49 kg/m.  Interventions:  Diet: Renal diet DVT Prophylaxis: Heparin IV infusion  Advance goals of care discussion: Full code  Family Communication:   Disposition:  Patient  needs SNF due to worsening functional status but declining.  A lot of behavioral issues   Subjective: Patient was resting comfortably while getting dialysis when seen today.  No new concern  Physical Exam: General.  Frail elderly man, in no acute distress. Pulmonary.  Lungs clear bilaterally, normal respiratory effort. CV.  Regular rate and rhythm, no JVD, rub or murmur. Abdomen.  Soft, nontender, nondistended, BS positive. CNS.  Somnolent, no focal neurologic deficit. Extremities.  No edema, no cyanosis, pulses intact and symmetrical.   Vitals:   05/19/23 1130 05/19/23 1200 05/19/23 1228 05/19/23 1325  BP: 112/85 (!) 104/55 (!) 112/51 (!) 115/50  Pulse:  69 76 81 84  Resp: (!) 30 (!) 29 (!) 36 17  Temp:   (!) 97.4 F (36.3 C) 98.1 F (36.7 C)  TempSrc:   Oral Oral  SpO2: 93% 94% 94% 95%  Weight:      Height:        Intake/Output Summary (Last 24 hours) at 05/19/2023 1450 Last data filed at 05/19/2023 1228 Gross per 24 hour  Intake --  Output 2000 ml  Net -2000 ml     Filed Weights   05/18/23 0427 05/19/23 0422 05/19/23 0844  Weight: 90.7 kg 89.8 kg 87.5 kg    Data Reviewed: Prior data reviewed.  CBC: Recent Labs  Lab 05/13/23 0610  05/15/23 0900 05/17/23 0932 05/19/23 0850  WBC 9.8 13.5* 10.6* 11.4*  NEUTROABS  --   --  9.1*  --   HGB 9.8* 9.5* 9.1* 8.9*  HCT 29.0* 28.9* 27.9* 27.8*  MCV 95.4 96.7 97.2 97.9  PLT 198 231 199 207   Basic Metabolic Panel: Recent Labs  Lab 05/13/23 0610 05/15/23 0900 05/17/23 0932 05/19/23 0850  NA 136 140 138 135  K 4.1 5.3* 5.0 4.4  CL 99 100 99 97*  CO2 24 21* 21* 24  GLUCOSE 151* 106* 222* 106*  BUN 60* 109* 96* 71*  CREATININE 5.69* 9.15* 7.49* 6.51*  CALCIUM 7.9* 8.3* 7.6* 7.9*  PHOS  --  7.7* 7.4* 4.7*    Studies: No results found.   Scheduled Meds:  apixaban  2.5 mg Oral BID   ascorbic acid  250 mg Oral Daily   bisacodyl  10 mg Oral QHS   budesonide (PULMICORT) nebulizer solution  0.5 mg Nebulization BID   calcium acetate  2,001 mg Oral TID WC   Chlorhexidine Gluconate Cloth  6 each Topical Daily   clotrimazole-betamethasone   Topical BID   dorzolamide-timolol  1 drop Both Eyes Daily   epoetin alfa-epbx (RETACRIT) injection  4,000 Units Intravenous Q M,W,F-HD   feeding supplement (NEPRO CARB STEADY)  237 mL Oral BID BM   guaiFENesin  600 mg Oral BID   insulin aspart  0-5 Units Subcutaneous QHS   insulin aspart  0-9 Units Subcutaneous TID WC   insulin aspart  4 Units Subcutaneous TID WC   insulin glargine-yfgn  5 Units Subcutaneous Daily   ipratropium-albuterol  3 mL Nebulization BID   midodrine  10 mg Oral TID WC   multivitamin  1 tablet Oral QHS   neomycin-polymyxin b-dexamethasone  1 Application Right Eye TID   pantoprazole  40 mg Oral BID   polyethylene glycol  17 g Oral BID   QUEtiapine  50 mg Oral BID   rosuvastatin  10 mg Oral Once per day on Monday Wednesday Friday   traZODone  50 mg Oral QHS   Continuous Infusions:    PRN Meds: acetaminophen, albuterol, bisacodyl, dextromethorphan-guaiFENesin, hydrALAZINE, HYDROmorphone **OR** HYDROmorphone (DILAUDID) injection, loperamide, nitroGLYCERIN, ondansetron (ZOFRAN) IV  Time spent: 41  minutes  Author: Arnetha Courser MD [facility] 05/19/2023 2:50 PM  To reach On-call, see care teams to locate the attending and reach out to them via [email or web address]. If 7PM-7AM, please contact night-coverage If you still have difficulty reaching the attending provider, please page the [facility] (Director on Call) for [facility] on amion for assistance.

## 2023-05-19 NOTE — Progress Notes (Signed)
  Received patient in bed to unit.   Informed consent signed and in chart.    TX duration:3.5 hrs      Transported back to floor  Hand-off given to patient's primary nurse.   Access used: R HD Cath Access issues: No    Total UF removed: 2L Medication(s) given: Retacrit Post HD VS: WNL Post HD weight: 87.5     [facility]

## 2023-05-19 NOTE — Progress Notes (Signed)
 [facility]  ROUNDING NOTE   Subjective:   Toris Laverdiere is a 83 y.o. male with diabetes mellitus type II, hypertension, coronary artery disease, chronic diastolic congestive heart failure, atrial fibrillation, pulmonary hypertension, and historyof subdural hematoma who is admitted for Weakness [R53.1] Fall, initial encounter [W19.XXXA] COVID-19 virus infection [U07.1] COVID-19 [U07.1] Uremia of renal origin [N19]  Patient is known to our practice and receives outpatient dialysis at [facility] on a MWF, supervised by Dr Cherylann Ratel. He received his last treatment on Wednesday.   Update:  Patient seen down in dialysis suite. Not yet started on dialysis. Still having a bit of weakness. Disposition still pending.   Objective:  Vital signs in last 24 hours:  Temp:  [97.6 F (36.4 C)-98.6 F (37 C)] 97.8 F (36.6 C) (02/26 0418) Pulse Rate:  [49-58] 52 (02/26 0418) Resp:  [18-19] 18 (02/26 0418) BP: (127-157)/(48-80) 127/55 (02/26 0418) SpO2:  [93 %-100 %] 93 % (02/26 0418) Weight:  [89.8 kg] 89.8 kg (02/26 0422)  Weight change: -0.272 kg Filed Weights   05/17/23 1450 05/18/23 0427 05/19/23 0422  Weight: 90.1 kg 90.7 kg 89.8 kg    Intake/Output: No intake/output data recorded.   Intake/Output this shift:  No intake/output data recorded.  Physical Exam: General: NAD  Head: Normocephalic, atraumatic. Moist oral mucosal membranes  Eyes: Anicteric  Lungs:  Clear to auscultation, room air  Heart: Regular rate and rhythm  Abdomen:  Soft, nontender  Extremities: 1+ peripheral edema.  Neurologic: Alert and oriented, moving all four extremities  Skin: Scaly bilateral lower extremities  Access: Right chest tunneled access    Basic Metabolic Panel: Recent Labs  Lab 05/13/23 0610 05/15/23 0900 05/17/23 0932  NA 136 140 138  K 4.1 5.3* 5.0  CL 99 100 99  CO2 24 21* 21*  GLUCOSE 151* 106* 222*  BUN 60* 109* 96*  CREATININE 5.69* 9.15* 7.49*  CALCIUM  7.9* 8.3* 7.6*  PHOS  --  7.7* 7.4*    Liver Function Tests: Recent Labs  Lab 05/15/23 0900 05/17/23 0932  ALBUMIN 2.6* 2.4*    No results for input(s): "LIPASE", "AMYLASE" in the last 168 hours.  No results for input(s): "AMMONIA" in the last 168 hours.  CBC: Recent Labs  Lab 05/13/23 0610 05/15/23 0900 05/17/23 0932  WBC 9.8 13.5* 10.6*  NEUTROABS  --   --  9.1*  HGB 9.8* 9.5* 9.1*  HCT 29.0* 28.9* 27.9*  MCV 95.4 96.7 97.2  PLT 198 231 199    Cardiac Enzymes: No results for input(s): "CKTOTAL", "CKMB", "CKMBINDEX", "TROPONINI" in the last 168 hours.   BNP: Invalid input(s): "POCBNP"  CBG: Recent Labs  Lab 05/18/23 0827 05/18/23 1125 05/18/23 1627 05/18/23 2114 05/19/23 0756  GLUCAP 137* 196* 196* 268* 83    Microbiology: Results for orders placed or performed during the hospital encounter of 05/05/23  Resp panel by RT-PCR (RSV, Flu A&B, Covid) Anterior Nasal Swab     Status: Abnormal   Collection Time: 05/05/23  4:33 PM   Specimen: Anterior Nasal Swab  Result Value Ref Range Status   SARS Coronavirus 2 by RT PCR POSITIVE (A) NEGATIVE Final    Comment: (NOTE) SARS-CoV-2 target nucleic acids are DETECTED.  The SARS-CoV-2 RNA is generally detectable in upper respiratory specimens during the acute phase of infection. Positive results are indicative of the presence of the identified virus, but do not rule out bacterial infection or co-infection with other pathogens not detected by the test. Clinical correlation  with patient history and other diagnostic information is necessary to determine patient infection status. The expected result is Negative.  Fact Sheet for Patients: BloggerCourse.com  Fact Sheet for Healthcare Providers: SeriousBroker.it  This test is not yet approved or cleared by the [place] FDA and  has been authorized for detection and/or diagnosis of SARS-CoV-2 by FDA under an  Emergency Use Authorization (EUA).  This EUA will remain in effect (meaning this test can be used) for the duration of  the COVID-19 declaration under Section 564(b)(1) of the A ct, 21 U.S.C. section 360bbb-3(b)(1), unless the authorization is terminated or revoked sooner.     Influenza A by PCR NEGATIVE NEGATIVE Final   Influenza B by PCR NEGATIVE NEGATIVE Final    Comment: (NOTE) The Xpert Xpress SARS-CoV-2/FLU/RSV plus assay is intended as an aid in the diagnosis of influenza from Nasopharyngeal swab specimens and should not be used as a sole basis for treatment. Nasal washings and aspirates are unacceptable for Xpert Xpress SARS-CoV-2/FLU/RSV testing.  Fact Sheet for Patients: BloggerCourse.com  Fact Sheet for Healthcare Providers: SeriousBroker.it  This test is not yet approved or cleared by the [place] FDA and has been authorized for detection and/or diagnosis of SARS-CoV-2 by FDA under an Emergency Use Authorization (EUA). This EUA will remain in effect (meaning this test can be used) for the duration of the COVID-19 declaration under Section 564(b)(1) of the Act, 21 U.S.C. section 360bbb-3(b)(1), unless the authorization is terminated or revoked.     Resp Syncytial Virus by PCR NEGATIVE NEGATIVE Final    Comment: (NOTE) Fact Sheet for Patients: BloggerCourse.com  Fact Sheet for Healthcare Providers: SeriousBroker.it  This test is not yet approved or cleared by the [place] FDA and has been authorized for detection and/or diagnosis of SARS-CoV-2 by FDA under an Emergency Use Authorization (EUA). This EUA will remain in effect (meaning this test can be used) for the duration of the COVID-19 declaration under Section 564(b)(1) of the Act, 21 U.S.C. section 360bbb-3(b)(1), unless the authorization is terminated or revoked.  Performed at [facility],  [address] [place], [place] [address]   C Difficile Quick Screen w PCR reflex     Status: None   Collection Time: 05/05/23  7:35 PM   Specimen: STOOL  Result Value Ref Range Status   C Diff antigen NEGATIVE NEGATIVE Final   C Diff toxin NEGATIVE NEGATIVE Final   C Diff interpretation No C. difficile detected.  Final    Comment: Performed at [facility], [address] [place], [place] [address]  MRSA Next Gen by PCR, Nasal     Status: None   Collection Time: 05/15/23  6:37 PM   Specimen: Nasal Mucosa; Nasal Swab  Result Value Ref Range Status   MRSA by PCR Next Gen NOT DETECTED NOT DETECTED Final    Comment: (NOTE) The GeneXpert MRSA Assay (FDA approved for NASAL specimens only), is one component of a comprehensive MRSA colonization surveillance program. It is not intended to diagnose MRSA infection nor to guide or monitor treatment for MRSA infections. Test performance is not FDA approved in patients less than 3 years old. Performed at [facility], [address] [place], [place] [address]     Coagulation Studies: No results for input(s): "LABPROT", "INR" in the last 72 hours.   Urinalysis: No results for input(s): "COLORURINE", "LABSPEC", "PHURINE", "GLUCOSEU", "HGBUR", "BILIRUBINUR", "KETONESUR", "PROTEINUR", "UROBILINOGEN", "NITRITE", "LEUKOCYTESUR" in the last 72 hours.  Invalid input(s): "APPERANCEUR"  Imaging: No results found.    Medications:       apixaban  2.5 mg Oral BID   ascorbic acid  250 mg Oral Daily   bisacodyl  10 mg Oral QHS   budesonide (PULMICORT) nebulizer solution  0.5 mg Nebulization BID   calcium acetate  1,334 mg Oral TID WC   Chlorhexidine Gluconate Cloth  6 each Topical Daily   clotrimazole-betamethasone   Topical BID   dorzolamide-timolol  1 drop Both Eyes Daily   epoetin alfa-epbx (RETACRIT) injection  4,000 Units Intravenous Q M,W,F-HD   feeding supplement (NEPRO CARB STEADY)  237 mL Oral BID BM    guaiFENesin  600 mg Oral BID   insulin aspart  0-5 Units Subcutaneous QHS   insulin aspart  0-9 Units Subcutaneous TID WC   insulin aspart  4 Units Subcutaneous TID WC   insulin glargine-yfgn  5 Units Subcutaneous Daily   ipratropium-albuterol  3 mL Nebulization BID   midodrine  10 mg Oral TID WC   multivitamin  1 tablet Oral QHS   neomycin-polymyxin b-dexamethasone  1 Application Right Eye TID   pantoprazole  40 mg Oral BID   polyethylene glycol  17 g Oral BID   QUEtiapine  50 mg Oral BID   rosuvastatin  10 mg Oral Once per day on Monday Wednesday Friday   traZODone  50 mg Oral QHS   acetaminophen, albuterol, bisacodyl, dextromethorphan-guaiFENesin, hydrALAZINE, HYDROmorphone **OR** HYDROmorphone (DILAUDID) injection, loperamide, nitroGLYCERIN, ondansetron (ZOFRAN) IV  Assessment/ Plan:  Mr. Yacob Wilkerson is a 83 y.o.  male with diabetes mellitus type II, hypertension, coronary artery disease, chronic diastolic congestive heart failure, atrial fibrillation, pulmonary hypertension, and historyof subdural hematoma who is admitted for Weakness [R53.1] Fall, initial encounter [W19.XXXA] COVID-19 virus infection [U07.1] COVID-19 [U07.1] Uremia of renal origin [N19]  [facility]/MWF/right chest PermCath  End-stage renal disease on hemodialysis.  Patient due for dialysis session today.  He had dialysis on Monday and tolerated well.  Today he will be dialyzed in the bed but did dialyze in the chair on Monday.  COVID-19, found positive this admission.  Isolation discontinued on 05/15/23.  Respiratory status appears to be stable.  3. Anemia of chronic kidney disease  Lab Results  Component Value Date   HGB 9.1 (L) 05/17/2023    Continue Epogen 4000 units IV with dialysis treatments.  4. Secondary Hyperparathyroidism: with outpatient labs: None available  Lab Results  Component Value Date   PTH 344 (H) 01/07/2023   CALCIUM 7.6 (L) 05/17/2023   CAION 1.14 (L) 12/02/2022    PHOS 7.4 (H) 05/17/2023    Phosphorus still high at 7.4.  Increase calcium acetate to 2001 mg 3 times daily with meals.   LOS: 12 Rosamaria Donn 2/26/20258:34 AM

## 2023-05-19 NOTE — Plan of Care (Signed)
  Problem: Education: Goal: Knowledge of risk factors and measures for prevention of condition will improve Outcome: Progressing   Problem: Coping: Goal: Psychosocial and spiritual needs will be supported Outcome: Progressing   Problem: Respiratory: Goal: Will maintain a patent airway Outcome: Progressing Goal: Complications related to the disease process, condition or treatment will be avoided or minimized Outcome: Progressing   Problem: Education: Goal: Knowledge of General Education information will improve Description: Including pain rating scale, medication(s)/side effects and non-pharmacologic comfort measures Outcome: Progressing   Problem: Health Behavior/Discharge Planning: Goal: Ability to manage health-related needs will improve Outcome: Progressing   Problem: Clinical Measurements: Goal: Ability to maintain clinical measurements within normal limits will improve Outcome: Progressing Goal: Will remain free from infection Outcome: Progressing Goal: Diagnostic test results will improve Outcome: Progressing Goal: Respiratory complications will improve Outcome: Progressing Goal: Cardiovascular complication will be avoided Outcome: Progressing   Problem: Activity: Goal: Risk for activity intolerance will decrease Outcome: Progressing   Problem: Nutrition: Goal: Adequate nutrition will be maintained Outcome: Progressing   Problem: Coping: Goal: Level of anxiety will decrease Outcome: Progressing   Problem: Elimination: Goal: Will not experience complications related to bowel motility Outcome: Progressing Goal: Will not experience complications related to urinary retention Outcome: Progressing   Problem: Pain Managment: Goal: General experience of comfort will improve and/or be controlled Outcome: Progressing   Problem: Safety: Goal: Ability to remain free from injury will improve Outcome: Progressing   Problem: Skin Integrity: Goal: Risk for impaired  skin integrity will decrease Outcome: Progressing   Problem: Education: Goal: Ability to describe self-care measures that may prevent or decrease complications (Diabetes Survival Skills Education) will improve Outcome: Progressing Goal: Individualized Educational Video(s) Outcome: Progressing   Problem: Coping: Goal: Ability to adjust to condition or change in health will improve Outcome: Progressing   Problem: Fluid Volume: Goal: Ability to maintain a balanced intake and output will improve Outcome: Progressing   Problem: Health Behavior/Discharge Planning: Goal: Ability to identify and utilize available resources and services will improve Outcome: Progressing Goal: Ability to manage health-related needs will improve Outcome: Progressing   Problem: Metabolic: Goal: Ability to maintain appropriate glucose levels will improve Outcome: Progressing   Problem: Nutritional: Goal: Maintenance of adequate nutrition will improve Outcome: Progressing Goal: Progress toward achieving an optimal weight will improve Outcome: Progressing   Problem: Skin Integrity: Goal: Risk for impaired skin integrity will decrease Outcome: Progressing   Problem: Tissue Perfusion: Goal: Adequacy of tissue perfusion will improve Outcome: Progressing

## 2023-05-20 DIAGNOSIS — W19XXXA Unspecified fall, initial encounter: Secondary | ICD-10-CM | POA: Diagnosis not present

## 2023-05-20 DIAGNOSIS — R531 Weakness: Secondary | ICD-10-CM | POA: Diagnosis not present

## 2023-05-20 DIAGNOSIS — U071 COVID-19: Secondary | ICD-10-CM | POA: Diagnosis not present

## 2023-05-20 LAB — GLUCOSE, CAPILLARY
Glucose-Capillary: 124 mg/dL — ABNORMAL HIGH (ref 70–99)
Glucose-Capillary: 96 mg/dL (ref 70–99)

## 2023-05-20 NOTE — TOC Progression Note (Addendum)
 Transition of Care [facility]) - Progression Note    Patient Details  Name: Kyle Roberson MRN: [id number] Date of Birth: 1941/01/15  [facility]) CM/SW Contact  Chapman Fitch, RN Phone Number: 05/20/2023, 2:06 PM  Clinical Narrative:     [name] at [facility] confirms they can offer a bed Wife [name] accepts [facility] notified to switch patients HD center to [place] or [facility]  Due to Wife selecting out of state facility, when there are in county facilities available she will have to private pay for transport, and then submit to insurance to see if they will cover.    Per [facility] transport their estimated price will be $1549.48.  Message left for wife requesting return call in order for me to review with her   Expected Discharge Plan: Home/Self Care Barriers to Discharge: Continued Medical Work up  Expected Discharge Plan and Services   Discharge Planning Services: CM Consult   Living arrangements for the past 2 months: Single Family Home                 DME Arranged: N/A DME Agency: NA       HH Arranged: Refused SNF, Patient Refused HH HH Agency: NA         Social Determinants of Health (SDOH) Interventions SDOH Screenings   Food Insecurity: No Food Insecurity (05/07/2023)  Housing: Low Risk  (05/08/2023)  Transportation Needs: No Transportation Needs (05/08/2023)  Utilities: Not At Risk (05/08/2023)  Depression (PHQ2-9): Low Risk  (07/27/2022)  Tobacco Use: Medium Risk (05/05/2023)    Readmission Risk Interventions    05/12/2023   10:07 AM  Readmission Risk Prevention Plan  Transportation Screening Complete  Medication Review (RN Care Manager) Referral to Pharmacy  PCP or Specialist appointment within 3-5 days of discharge Complete  HRI or Home Care Consult Complete  Palliative Care Screening Not Applicable  Skilled Nursing Facility Not Applicable

## 2023-05-20 NOTE — Progress Notes (Signed)
 [facility] Progress Note  Patient: Kyle Roberson    ZOX:[id number]  DOA: 05/05/2023     Date of Service: the patient was seen and examined on 05/20/2023  Chief Complaint  Patient presents with   Fall   Brief hospital course:  Titan Karner is a 83 y.o. male with medical history significant of ESRD-HD (MWF), HTN, HLD, DM, CAD with stent, dCHF, depression with anxiety, BPH, anemia, A-fib on Eliquis, left eye blindness, who presents with cough, shortness of breath, diarrhea, fall.   Per pt and his wife (I called his wife by phone), patient has productive cough, sore throat, SOB, malaise for more than 5 days.  No chest pain.  No fever or chills.  Patient has diarrhea for more than 10 days, with 2- 3 times of watery diarrhea each day.  No nausea, vomiting or abdominal pain.  Patient fell this morning, no loss of consciousness. He patient states that he was going to the bathroom when he suddenly felt weak in his gave out and fell.  No unilateral numbness or tingling in extremities.  No facial droop or slurred speech.  Patient states that he has missed several dialysis. His last dialysis was on Monday.    Data reviewed independently and ED Course: pt was found to have positive COVID 19, WBC 5.6, troponin 95 --> 105, potassium 4.7, bicarbonate 18, creatinine 10.02, BUN 102.  CT head negative. Chest x-ray showed cardiomegaly with vascular congestion.  Patient is placed on telemetry bed for observation.     EKG: I have personally reviewed.  A-fib, QTc 461, low voltage, poor IV progression, anteroseptal infarction pattern.  2/22: Hemodynamically stable, getting dialysis.  Lengthy discussion with wife on phone to make decision regarding SNF versus home health, patient does not want to go to rehab himself and becoming agitated and combative. Psych was also consulted for capacity evaluation-pending. Currently being managed with as needed Haldol.  2/23; yesterday evening patient was  transferred to stepdown due to softer blood pressure which shows initial poor response to IV fluid.  Later blood pressure started improving with addition of midodrine.  Patient is now at baseline and remained quite combative and rude, refusing most of the care.  Being transferred out of stepdown.  2/24: Patient had dialysis today.  Behavior remained the same.  Patient does not have any capacity to make any medical decision per psychiatry today.  Wife would like him to go to rehab.  TOC to start looking for a place.  2/25: Remained medically stable.  Awaiting SNF placement  2/26: Had dialysis today.  Awaiting SNF  2/27: Patient has been accepted at [facility] in [place]. CSW assistant with switching patient's HD center to [place] or [place]. Plan for repeat HD tomorrow. Refused CBG check during lunch.  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 nightly and Seroquel 25 mg p.o. twice daily 2/20 psych consulted for further recommendation Patient remained agitated and eventually psych was able to see him today, patient does not have any capacity -Continue as needed Haldol  # COVID-19 virus infection: No fever, no leukocytosis, no oxygen desaturation.   Chest x-ray showed vascular congestion, no infiltration. -Bronchodilators, Mucinex 600 mg p.o. twice daily, Robitussin DM as needed for cough -Continue supportive care 2/15 still sob after HD, may benefit from steroids and breathing treatments S/p Prednisone 40 mg p.o. daily x 3 days, Breo Ellipta inhaler Continue albuterol as  needed 2/16 patient is refusing inhalers, started on nebulizer treatments if he agrees. 2/18 started Decadron 6 mg p.o. daily x 4 days til 2/21 2/21 decreased Decadron 4 mg p.o. daily for 2 days followed by 2 mg p.o. daily for 2 days due to mental status changes.  Steroids may be contributing. Continues to refuse  certain treatments including inhalers and some medication.  # CAD (coronary artery disease) and Myocardial injury:  Troponin 894, but No chest pain and no significant EKG changes Continue to monitor on telemetry S/p ASA 81 mg and heparin IV infusion, discontinued on 2/14, due to GI bleed 2/14 continue to hold Eliquis for now Cardiology consulted, recommended no ischemic workup at this time, follow as an outpatient for Myoview when recovered after COVID infection TTE shows LVEF 55 to 60%, grade 2 diastolic dysfunction, no wall motion abnormality.  Severe pulmonary hypertension, RA moderately dilated. Moderate TR As per cardio patient may need right and left heart cath due to pulmonary hypertension and elevated troponin.  # Paroxysmal atrial fibrillation: -Tele monitoring - s/p heparin IV infusion, Hb dropped, suspected GI bleed. Patient was cleared by GI to continue heparin IV infusion.  2/15 resumed Eliquis 2.5 mg p.o. twice daily as per cardiology 2/27: Received his Eliquis last night and this morning.  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 Hemoglobin 8.9 yesterday.  Follow-up repeat CBC tomorrow  # ESRD on dialysis Nephrology following, had HD yesterday with 2 L removed. Continue hemodialysis MWF schedule Hyperphosphatemia, started PhosLo Will need new outpatient HD center in [place] after discharge to SNF  # Fall at home, initial encounter: CT-head negative -Fall precaution, CK level wnl -PT/OT -recommending SNF  # Type II diabetes mellitus with renal manifestations:  Recent A1c 7.3, poorly controlled for patient taking glipizide at home -SSI 2/18 started Semglee 5 units subcu daily and NovoLog 4 units 3 times daily  due to hyperglycemia secondary to steroids. Follow diabetic coordinator and titrate insulin dose accordingly. Monitor CBG and continue diabetic diet 2/27 intermittently receiving insulin or CBG checks. Fasting blood sugar 124 this morning.   # Essential hypertension: Patient's not taking medications.   Nonsudden V. tach episode 7 beats run at 630 on 2/13 reported 2/13 started metoprolol 12.5 mg p.o. 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 Difficile negative Now patient developed constipation.  # Constipation, started laxatives on 2/19 2/21 started Dulcolax p.o. Use suppository and enema as needed  Body mass index is 29.11 kg/m.  Interventions:  Diet: Renal diet DVT Prophylaxis: Eliquis  Advance goals of care discussion: Full code  Family Communication:   Disposition:  Patient  needs SNF due to worsening functional status but declining.  A lot of behavioral issues   Subjective: Evaluated resting comfortably in bed. Eyes seems to be slightly closed. Answers questions appropriately with occasionally  making jokes. Attempted to scare provider jokingly as provider was getting close to examine him.  Physical Exam: General: Chronically ill elderly man laying in bed. No acute distress. CV: RRR. No murmurs, rubs, or gallops.  1+ BLE edema Pulmonary: Lungs CTAB. Normal effort. No wheezing or rales. Abdominal: Soft, nontender, nondistended. Normal bowel sounds. Skin: Warm and dry. No obvious rash or lesions. Neuro: A&Ox3. Moves all extremities. Normal sensation  to light touch. No focal deficit. Dialysis access: TDC in the right chest.    Vitals:   05/20/23 0500 05/20/23 0750 05/20/23 0815 05/20/23 1432  BP:   (!) 117/43 (!) 129/54  Pulse:   65 (!) 51  Resp:   18 18  Temp:   98 F (36.7 C) 98 F (36.7 C)  TempSrc:    Oral  SpO2:  90% 92% 99%  Weight: 89.4 kg     Height:        Intake/Output Summary (Last 24 hours) at 05/20/2023 1833 Last data filed at 05/20/2023 1054 Gross per 24 hour  Intake 120 ml  Output --  Net 120 ml     Filed Weights   05/19/23 0422 05/19/23 0844 05/20/23 0500  Weight: 89.8 kg 87.5 kg 89.4 kg    Data Reviewed: Prior data reviewed.  CBC: Recent Labs  Lab 05/15/23 0900 05/17/23 0932 05/19/23 0850  WBC 13.5* 10.6* 11.4*  NEUTROABS  --  9.1*  --   HGB 9.5* 9.1* 8.9*  HCT 28.9* 27.9* 27.8*  MCV 96.7 97.2 97.9  PLT 231 199 207   Basic Metabolic Panel: Recent Labs  Lab 05/15/23 0900 05/17/23 0932 05/19/23 0850  NA 140 138 135  K 5.3* 5.0 4.4  CL 100 99 97*  CO2 21* 21* 24  GLUCOSE 106* 222* 106*  BUN 109* 96* 71*  CREATININE 9.15* 7.49* 6.51*  CALCIUM 8.3* 7.6* 7.9*  PHOS 7.7* 7.4* 4.7*    Studies: No results found.   Scheduled Meds:  apixaban  2.5 mg Oral BID   ascorbic acid  250 mg Oral Daily   bisacodyl  10 mg Oral QHS   budesonide (PULMICORT) nebulizer solution  0.5 mg Nebulization BID   calcium acetate  2,001 mg Oral TID WC   Chlorhexidine Gluconate Cloth  6 each Topical Daily   clotrimazole-betamethasone   Topical BID   dorzolamide-timolol  1 drop Both Eyes Daily   epoetin alfa-epbx (RETACRIT) injection  4,000 Units Intravenous Q M,W,F-HD   feeding supplement (NEPRO CARB STEADY)  237 mL Oral BID BM   guaiFENesin  600 mg Oral BID   insulin aspart  0-5 Units Subcutaneous QHS   insulin aspart  0-9 Units Subcutaneous TID WC   insulin aspart  4 Units Subcutaneous TID WC   insulin glargine-yfgn  5 Units Subcutaneous Daily   ipratropium-albuterol  3 mL Nebulization BID    midodrine  10 mg Oral TID WC   multivitamin  1 tablet Oral QHS   neomycin-polymyxin b-dexamethasone  1 Application Right Eye TID   pantoprazole  40 mg Oral BID   polyethylene glycol  17 g Oral BID   QUEtiapine  50 mg Oral BID   rosuvastatin  10 mg Oral Once per day on Monday Wednesday Friday   traZODone  50 mg Oral QHS   Continuous Infusions:    PRN Meds: acetaminophen, albuterol, bisacodyl, dextromethorphan-guaiFENesin, hydrALAZINE, HYDROmorphone **OR** HYDROmorphone (DILAUDID) injection, loperamide, nitroGLYCERIN, ondansetron (ZOFRAN) IV  Time spent: 40 minutes  Author: Arnetha Courser MD [facility] 05/20/2023 6:33  PM  To reach On-call, see care teams to locate the attending and reach out to them via [email or web address]. If 7PM-7AM, please contact night-coverage If you still have difficulty reaching the attending provider, please page the [facility] (Director on Call) for [facility] on amion for assistance.

## 2023-05-20 NOTE — Progress Notes (Signed)
 Occupational Therapy Treatment Patient Details Name: Kyle Roberson MRN: [id number] DOB: 05-28-1940 Today's Date: 05/20/2023   History of present illness Kyle Roberson is a 83 y.o. male with medical history significant of ESRD-HD (MWF), HTN, HLD, DM, CAD with stent, dCHF, depression with anxiety, BPH, anemia, A-fib on Eliquis, left eye blindness, who presents with cough, shortness of breath, diarrhea, fall. COVID +   OT comments  Kyle Roberson was seen for OT/PT co-treatment on this date. Upon arrival to room pt in bed, agreeable to tx. Pt requires MOD A exit bed, initial MOD A static sitting improves to CGA with cues and time. MOD A x2 + RW sit<>stand x3 trials, unable to tolerate steps. MIN A sit>sup. Pt making good progress toward goals, will continue to follow POC. Discharge recommendation remains appropriate.       If plan is discharge home, recommend the following:  Two people to help with bathing/dressing/bathroom;Two people to help with walking and/or transfers;Assistance with cooking/housework;Assist for transportation;Help with stairs or ramp for entrance   Equipment Recommendations  Other (comment) (defer)    Recommendations for Other Services      Precautions / Restrictions Precautions Precautions: Fall Recall of Precautions/Restrictions: Impaired Restrictions Weight Bearing Restrictions Per Provider Order: No       Mobility Bed Mobility Overal bed mobility: Needs Assistance Bed Mobility: Supine to Sit, Sit to Supine     Supine to sit: Mod assist, +2 for physical assistance Sit to supine: Min assist, +2 for physical assistance        Transfers Overall transfer level: Needs assistance Equipment used: Rolling walker (2 wheels)   Sit to Stand: Mod assist, +2 physical assistance, From elevated surface           General transfer comment: unable to step or transfer to chair     Balance Overall balance assessment: History of Falls, Needs  assistance Sitting-balance support: Feet supported, Bilateral upper extremity supported Sitting balance-Leahy Scale: Poor Sitting balance - Comments: assist varied from cga to mod a. when he leans forard he does much better but tends to post lean Postural control: Posterior lean Standing balance support: Bilateral upper extremity supported, During functional activity, Reliant on assistive device for balance Standing balance-Leahy Scale: Poor                             ADL either performed or assessed with clinical judgement   ADL Overall ADL's : Needs assistance/impaired                                       General ADL Comments: MIN A self-feeding seated EOB, assist sitting balance     Communication Communication Communication: No apparent difficulties   Cognition Arousal: Alert Behavior During Therapy: Agitated, WFL for tasks assessed/performed Cognition: No apparent impairments                               Following commands: Intact        Cueing   Cueing Techniques: Verbal cues  Exercises      Shoulder Instructions       General Comments      Pertinent Vitals/ Pain       Pain Assessment Pain Assessment: 0-10 Faces Pain Scale: Hurts whole lot Pain Location: LEs with any movement/touch. Yelling out/swearing  but quickly calms Pain Descriptors / Indicators: Discomfort, Sore Pain Intervention(s): Limited activity within patient's tolerance, Repositioned   Frequency  Min 1X/week        Progress Toward Goals  OT Goals(current goals can now be found in the care plan section)  Progress towards OT goals: Progressing toward goals  Acute Rehab OT Goals Patient Stated Goal: to walk OT Goal Formulation: With patient/family Time For Goal Achievement: 05/31/23 Potential to Achieve Goals: Fair ADL Goals Pt Will Perform Grooming: with modified independence;sitting Pt Will Perform Lower Body Dressing: with mod  assist;with adaptive equipment;with caregiver independent in assisting;sitting/lateral leans Pt Will Transfer to Toilet: with min assist;stand pivot transfer;bedside commode  Plan      Co-evaluation    PT/OT/SLP Co-Evaluation/Treatment: Yes   PT goals addressed during session: Mobility/safety with mobility OT goals addressed during session: ADL's and self-care      AM-PAC OT "6 Clicks" Daily Activity     Outcome Measure   Help from another person eating meals?: None Help from another person taking care of personal grooming?: A Little Help from another person toileting, which includes using toliet, bedpan, or urinal?: A Lot Help from another person bathing (including washing, rinsing, drying)?: A Lot Help from another person to put on and taking off regular upper body clothing?: A Little Help from another person to put on and taking off regular lower body clothing?: A Lot 6 Click Score: 16    End of Session    OT Visit Diagnosis: Other abnormalities of gait and mobility (R26.89);Pain Pain - Right/Left: Right Pain - part of body: Leg   Activity Tolerance Patient limited by pain;Patient tolerated treatment well   Patient Left in bed;with call bell/phone within reach;with bed alarm set   Nurse Communication Mobility status        Time: 2956-2130 OT Time Calculation (min): 18 min  Charges: OT General Charges $OT Visit: 1 Visit OT Treatments $Self Care/Home Management : 8-22 mins  Kathie Dike, M.S. OTR/L  05/20/23, 4:13 PM  ascom [phone]

## 2023-05-20 NOTE — Progress Notes (Addendum)
 Physical Therapy Treatment Patient Details Name: Kyle Roberson MRN: [id number] DOB: 19-Apr-1940 Today's Date: 05/20/2023   History of Present Illness Kyle Roberson is a 83 y.o. male with medical history significant of ESRD-HD (MWF), HTN, HLD, DM, CAD with stent, dCHF, depression with anxiety, BPH, anemia, A-fib on Eliquis, left eye blindness, who presents with cough, shortness of breath, diarrhea, fall. COVID +    PT Comments  Co-tx with OT for improved outcomes.  He is able to get to EOB with time and mod a x 2.  He does have some trouble obtaining sitting balance today initially needing mod a x 1 but does progress to cga x 1.  Post lean but once he leans forward after standing it does improve.  Stood x 3 from elevated bed to RW with mod a x 2 with generally good effort.  Each attempt is improved.  He is unable to step and when he does attempt step on 3rd trial falls post back onto bed with assist to descend.  Does returns to supine with min a x 2 and max a x 2 to reposition in bed.  B feet remain very painful to touch but does seem to tolerate touching/cupping his heels when needed to assist.   If plan is discharge home, recommend the following: Two people to help with walking and/or transfers;Two people to help with bathing/dressing/bathroom;Assist for transportation;Help with stairs or ramp for entrance   Can travel by private vehicle        Equipment Recommendations       Recommendations for Other Services       Precautions / Restrictions Precautions Precautions: Fall Recall of Precautions/Restrictions: Impaired Restrictions Weight Bearing Restrictions Per Provider Order: No     Mobility  Bed Mobility Overal bed mobility: Needs Assistance Bed Mobility: Supine to Sit, Sit to Supine     Supine to sit: Mod assist, +2 for physical assistance Sit to supine: Min assist, +2 for physical assistance        Transfers Overall transfer level: Needs assistance Equipment  used: Rolling walker (2 wheels)   Sit to Stand: Mod assist, +2 physical assistance, From elevated surface           General transfer comment: unable to step or transfer to chair    Ambulation/Gait               General Gait Details: unable   Stairs             Wheelchair Mobility     Tilt Bed    Modified Rankin (Stroke Patients Only)       Balance Overall balance assessment: History of Falls, Needs assistance Sitting-balance support: Feet supported, Bilateral upper extremity supported Sitting balance-Leahy Scale: Poor Sitting balance - Comments: assist varied from cga to mod a. when he leans forard he does much better but tends to post lean Postural control: Posterior lean Standing balance support: Bilateral upper extremity supported, During functional activity, Reliant on assistive device for balance Standing balance-Leahy Scale: Poor Standing balance comment: +2 assist for standing                            Communication Communication Communication: No apparent difficulties  Cognition Arousal: Alert Behavior During Therapy: Agitated, WFL for tasks assessed/performed   PT - Cognitive impairments: No apparent impairments  PT - Cognition Comments: generally grouchy but cooperates with session.  bark worst than bite Following commands: Intact      Cueing Cueing Techniques: Verbal cues  Exercises Other Exercises Other Exercises: static sitting. attempted to get him to eat but he refuses all but water    General Comments        Pertinent Vitals/Pain Pain Assessment Pain Assessment: Faces Faces Pain Scale: Hurts whole lot Pain Location: LEs with any movement/touch. Yelling out/swearing but quickly calms Pain Descriptors / Indicators: Discomfort, Sore Pain Intervention(s): Limited activity within patient's tolerance, Monitored during session, Repositioned    Home Living                           Prior Function            PT Goals (current goals can now be found in the care plan section) Progress towards PT goals: Progressing toward goals    Frequency    Min 1X/week      PT Plan      Co-evaluation PT/OT/SLP Co-Evaluation/Treatment: Yes   PT goals addressed during session: Mobility/safety with mobility OT goals addressed during session: ADL's and self-care      AM-PAC PT "6 Clicks" Mobility   Outcome Measure  Help needed turning from your back to your side while in a flat bed without using bedrails?: A Lot Help needed moving from lying on your back to sitting on the side of a flat bed without using bedrails?: A Lot Help needed moving to and from a bed to a chair (including a wheelchair)?: Total Help needed standing up from a chair using your arms (e.g., wheelchair or bedside chair)?: A Lot Help needed to walk in hospital room?: Total Help needed climbing 3-5 steps with a railing? : Total 6 Click Score: 9    End of Session Equipment Utilized During Treatment: Gait belt Activity Tolerance: Patient limited by fatigue;Patient limited by pain Patient left: in bed;with call bell/phone within reach;with bed alarm set Nurse Communication: Mobility status PT Visit Diagnosis: Muscle weakness (generalized) (M62.81);Difficulty in walking, not elsewhere classified (R26.2);Other abnormalities of gait and mobility (R26.89)     Time: 2440-1027 PT Time Calculation (min) (ACUTE ONLY): 18 min  Charges:    $Therapeutic Activity: 8-22 mins PT General Charges $$ ACUTE PT VISIT: 1 Visit                   Danielle Dess, PTA 05/20/23, 3:45 PM

## 2023-05-20 NOTE — Progress Notes (Signed)
 [facility]  ROUNDING NOTE   Subjective:   Kyle Roberson is a 83 y.o. male with diabetes mellitus type II, hypertension, coronary artery disease, chronic diastolic congestive heart failure, atrial fibrillation, pulmonary hypertension, and historyof subdural hematoma who is admitted for Weakness [R53.1] Fall, initial encounter [W19.XXXA] COVID-19 virus infection [U07.1] COVID-19 [U07.1] Uremia of renal origin [N19]  Patient is known to our practice and receives outpatient dialysis at [facility] on a MWF, supervised by Dr Cherylann Ratel. He received his last treatment on Wednesday.   Update:  Patient seen laying in bed Nursing at bedside assisting with ADLs  Next dialysis scheduled for tomorrow   Objective:  Vital signs in last 24 hours:  Temp:  [97.8 F (36.6 C)-98.5 F (36.9 C)] 98 F (36.7 C) (02/27 0815) Pulse Rate:  [65-84] 65 (02/27 0815) Resp:  [16-18] 18 (02/27 0815) BP: (110-124)/(43-55) 117/43 (02/27 0815) SpO2:  [90 %-95 %] 92 % (02/27 0815) Weight:  [89.4 kg] 89.4 kg (02/27 0500)  Weight change: -2.312 kg Filed Weights   05/19/23 0422 05/19/23 0844 05/20/23 0500  Weight: 89.8 kg 87.5 kg 89.4 kg    Intake/Output: I/O last 3 completed shifts: In: -  Out: 2000 [Other:2000]   Intake/Output this shift:  Total I/O In: 120 [P.O.:120] Out: -   Physical Exam: General: NAD  Head: Normocephalic, atraumatic. Moist oral mucosal membranes  Eyes: Anicteric  Lungs:  Clear to auscultation, room air  Heart: Regular rate and rhythm  Abdomen:  Soft, nontender  Extremities: 1+ peripheral edema.  Neurologic: Alert and oriented, moving all four extremities  Skin: Scaly bilateral lower extremities  Access: Right chest tunneled access    Basic Metabolic Panel: Recent Labs  Lab 05/15/23 0900 05/17/23 0932 05/19/23 0850  NA 140 138 135  K 5.3* 5.0 4.4  CL 100 99 97*  CO2 21* 21* 24  GLUCOSE 106* 222* 106*  BUN 109* 96* 71*  CREATININE 9.15* 7.49*  6.51*  CALCIUM 8.3* 7.6* 7.9*  PHOS 7.7* 7.4* 4.7*    Liver Function Tests: Recent Labs  Lab 05/15/23 0900 05/17/23 0932 05/19/23 0850  ALBUMIN 2.6* 2.4* 2.5*    No results for input(s): "LIPASE", "AMYLASE" in the last 168 hours.  No results for input(s): "AMMONIA" in the last 168 hours.  CBC: Recent Labs  Lab 05/15/23 0900 05/17/23 0932 05/19/23 0850  WBC 13.5* 10.6* 11.4*  NEUTROABS  --  9.1*  --   HGB 9.5* 9.1* 8.9*  HCT 28.9* 27.9* 27.8*  MCV 96.7 97.2 97.9  PLT 231 199 207    Cardiac Enzymes: No results for input(s): "CKTOTAL", "CKMB", "CKMBINDEX", "TROPONINI" in the last 168 hours.   BNP: Invalid input(s): "POCBNP"  CBG: Recent Labs  Lab 05/19/23 0756 05/19/23 1316 05/19/23 1628 05/19/23 2027 05/20/23 0816  GLUCAP 83 142* 167* 162* 124*    Microbiology: Results for orders placed or performed during the hospital encounter of 05/05/23  Resp panel by RT-PCR (RSV, Flu A&B, Covid) Anterior Nasal Swab     Status: Abnormal   Collection Time: 05/05/23  4:33 PM   Specimen: Anterior Nasal Swab  Result Value Ref Range Status   SARS Coronavirus 2 by RT PCR POSITIVE (A) NEGATIVE Final    Comment: (NOTE) SARS-CoV-2 target nucleic acids are DETECTED.  The SARS-CoV-2 RNA is generally detectable in upper respiratory specimens during the acute phase of infection. Positive results are indicative of the presence of the identified virus, but do not rule out bacterial infection or co-infection with  other pathogens not detected by the test. Clinical correlation with patient history and other diagnostic information is necessary to determine patient infection status. The expected result is Negative.  Fact Sheet for Patients: BloggerCourse.com  Fact Sheet for Healthcare Providers: SeriousBroker.it  This test is not yet approved or cleared by the [place] FDA and  has been authorized for detection and/or  diagnosis of SARS-CoV-2 by FDA under an Emergency Use Authorization (EUA).  This EUA will remain in effect (meaning this test can be used) for the duration of  the COVID-19 declaration under Section 564(b)(1) of the A ct, 21 U.S.C. section 360bbb-3(b)(1), unless the authorization is terminated or revoked sooner.     Influenza A by PCR NEGATIVE NEGATIVE Final   Influenza B by PCR NEGATIVE NEGATIVE Final    Comment: (NOTE) The Xpert Xpress SARS-CoV-2/FLU/RSV plus assay is intended as an aid in the diagnosis of influenza from Nasopharyngeal swab specimens and should not be used as a sole basis for treatment. Nasal washings and aspirates are unacceptable for Xpert Xpress SARS-CoV-2/FLU/RSV testing.  Fact Sheet for Patients: BloggerCourse.com  Fact Sheet for Healthcare Providers: SeriousBroker.it  This test is not yet approved or cleared by the [place] FDA and has been authorized for detection and/or diagnosis of SARS-CoV-2 by FDA under an Emergency Use Authorization (EUA). This EUA will remain in effect (meaning this test can be used) for the duration of the COVID-19 declaration under Section 564(b)(1) of the Act, 21 U.S.C. section 360bbb-3(b)(1), unless the authorization is terminated or revoked.     Resp Syncytial Virus by PCR NEGATIVE NEGATIVE Final    Comment: (NOTE) Fact Sheet for Patients: BloggerCourse.com  Fact Sheet for Healthcare Providers: SeriousBroker.it  This test is not yet approved or cleared by the [place] FDA and has been authorized for detection and/or diagnosis of SARS-CoV-2 by FDA under an Emergency Use Authorization (EUA). This EUA will remain in effect (meaning this test can be used) for the duration of the COVID-19 declaration under Section 564(b)(1) of the Act, 21 U.S.C. section 360bbb-3(b)(1), unless the authorization is terminated  or revoked.  Performed at [facility], [address] [place], [place] [address]   C Difficile Quick Screen w PCR reflex     Status: None   Collection Time: 05/05/23  7:35 PM   Specimen: STOOL  Result Value Ref Range Status   C Diff antigen NEGATIVE NEGATIVE Final   C Diff toxin NEGATIVE NEGATIVE Final   C Diff interpretation No C. difficile detected.  Final    Comment: Performed at [facility], [address] [place], [place] [address]  MRSA Next Gen by PCR, Nasal     Status: None   Collection Time: 05/15/23  6:37 PM   Specimen: Nasal Mucosa; Nasal Swab  Result Value Ref Range Status   MRSA by PCR Next Gen NOT DETECTED NOT DETECTED Final    Comment: (NOTE) The GeneXpert MRSA Assay (FDA approved for NASAL specimens only), is one component of a comprehensive MRSA colonization surveillance program. It is not intended to diagnose MRSA infection nor to guide or monitor treatment for MRSA infections. Test performance is not FDA approved in patients less than 53 years old. Performed at [facility], [address] [place], [place] [address]     Coagulation Studies: No results for input(s): "LABPROT", "INR" in the last 72 hours.   Urinalysis: No results for input(s): "COLORURINE", "LABSPEC", "PHURINE", "GLUCOSEU", "HGBUR", "BILIRUBINUR", "KETONESUR", "PROTEINUR", "UROBILINOGEN", "NITRITE", "LEUKOCYTESUR" in the  last 72 hours.  Invalid input(s): "APPERANCEUR"    Imaging: No results found.    Medications:       apixaban  2.5 mg Oral BID   ascorbic acid  250 mg Oral Daily   bisacodyl  10 mg Oral QHS   budesonide (PULMICORT) nebulizer solution  0.5 mg Nebulization BID   calcium acetate  2,001 mg Oral TID WC   Chlorhexidine Gluconate Cloth  6 each Topical Daily   clotrimazole-betamethasone   Topical BID   dorzolamide-timolol  1 drop Both Eyes Daily   epoetin alfa-epbx (RETACRIT) injection  4,000 Units Intravenous Q M,W,F-HD   feeding  supplement (NEPRO CARB STEADY)  237 mL Oral BID BM   guaiFENesin  600 mg Oral BID   insulin aspart  0-5 Units Subcutaneous QHS   insulin aspart  0-9 Units Subcutaneous TID WC   insulin aspart  4 Units Subcutaneous TID WC   insulin glargine-yfgn  5 Units Subcutaneous Daily   ipratropium-albuterol  3 mL Nebulization BID   midodrine  10 mg Oral TID WC   multivitamin  1 tablet Oral QHS   neomycin-polymyxin b-dexamethasone  1 Application Right Eye TID   pantoprazole  40 mg Oral BID   polyethylene glycol  17 g Oral BID   QUEtiapine  50 mg Oral BID   rosuvastatin  10 mg Oral Once per day on Monday Wednesday Friday   traZODone  50 mg Oral QHS   acetaminophen, albuterol, bisacodyl, dextromethorphan-guaiFENesin, hydrALAZINE, HYDROmorphone **OR** HYDROmorphone (DILAUDID) injection, loperamide, nitroGLYCERIN, ondansetron (ZOFRAN) IV  Assessment/ Plan:  [name] is a 83 y.o.  male with diabetes mellitus type II, hypertension, coronary artery disease, chronic diastolic congestive heart failure, atrial fibrillation, pulmonary hypertension, and historyof subdural hematoma who is admitted for Weakness [R53.1] Fall, initial encounter [W19.XXXA] COVID-19 virus infection [U07.1] COVID-19 [U07.1] Uremia of renal origin [N19]  [facility]/MWF/right chest PermCath  End-stage renal disease on hemodialysis.  Patient received dialysis yesterday, UF 2 L achieved.  Next treatment scheduled for Friday.  COVID-19, found positive this admission.  Isolation discontinued on 05/15/23.  Remains on room air with no complaints of shortness of breath.  3. Anemia of chronic kidney disease  Lab Results  Component Value Date   HGB 8.9 (L) 05/19/2023  Hemoglobin below optimal range. Continue Epogen 4000 units IV with dialysis treatments.  4. Secondary Hyperparathyroidism: with outpatient labs: None available  Lab Results  Component Value Date   PTH 344 (H) 01/07/2023   CALCIUM 7.9 (L) 05/19/2023    CAION 1.14 (L) 12/02/2022   PHOS 4.7 (H) 05/19/2023    Phosphorus acceptable, 4.7.  Continue calcium acetate to 2001 mg 3 times daily with meals.   LOS: 13 Quintez Maselli 2/27/202512:31 PM

## 2023-05-20 NOTE — Discharge Planning (Signed)
 Working on placement at [facility] in [place], [place].  Referral sent, waiting on clinical acceptance.  Dimas Chyle Dialysis Coordinator II  [facility] Cell: [phone] eFax: [phone] [email or web address]

## 2023-05-21 DIAGNOSIS — U071 COVID-19: Secondary | ICD-10-CM | POA: Diagnosis not present

## 2023-05-21 DIAGNOSIS — I480 Paroxysmal atrial fibrillation: Secondary | ICD-10-CM | POA: Diagnosis not present

## 2023-05-21 DIAGNOSIS — E1122 Type 2 diabetes mellitus with diabetic chronic kidney disease: Secondary | ICD-10-CM | POA: Diagnosis not present

## 2023-05-21 DIAGNOSIS — I25118 Atherosclerotic heart disease of native coronary 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: Secondary | ICD-10-CM | POA: Diagnosis not present

## 2023-05-21 LAB — GLUCOSE, CAPILLARY
Glucose-Capillary: 206 mg/dL — ABNORMAL HIGH (ref 70–99)
Glucose-Capillary: 249 mg/dL — ABNORMAL HIGH (ref 70–99)
Glucose-Capillary: 133 mg/dL — ABNORMAL HIGH (ref 70–99)
Glucose-Capillary: 196 mg/dL — ABNORMAL HIGH (ref 70–99)

## 2023-05-21 LAB — RENAL FUNCTION PANEL
Albumin: 2.3 g/dL — ABNORMAL LOW (ref 3.5–5.0)
Anion gap: 15 (ref 5–15)
BUN: 59 mg/dL — ABNORMAL HIGH (ref 8–23)
CO2: 22 mmol/L (ref 22–32)
Calcium: 7.6 mg/dL — ABNORMAL LOW (ref 8.9–10.3)
Chloride: 97 mmol/L — ABNORMAL LOW (ref 98–111)
Creatinine, Ser: 6.4 mg/dL — ABNORMAL HIGH (ref 0.61–1.24)
GFR, Estimated: 8 mL/min — ABNORMAL LOW (ref >60)
Glucose, Bld: 263 mg/dL — ABNORMAL HIGH (ref 70–99)
Phosphorus: 3.3 mg/dL (ref 2.5–4.6)
Potassium: 4.5 mmol/L (ref 3.5–5.1)
Sodium: 134 mmol/L — ABNORMAL LOW (ref 135–145)

## 2023-05-21 LAB — CBC
HCT: 26.8 % — ABNORMAL LOW (ref 39.0–52.0)
Hemoglobin: 8.5 g/dL — ABNORMAL LOW (ref 13.0–17.0)
MCH: 31.4 pg (ref 26.0–34.0)
MCHC: 31.7 g/dL (ref 30.0–36.0)
MCV: 98.9 fL (ref 80.0–100.0)
Platelets: 232 10*3/uL (ref 150–400)
RBC: 2.71 MIL/uL — ABNORMAL LOW (ref 4.22–5.81)
RDW: 13.9 % (ref 11.5–15.5)
WBC: 7.4 10*3/uL (ref 4.0–10.5)
nRBC: 0 % (ref 0.0–0.2)

## 2023-05-21 MED ORDER — HEPARIN SODIUM (PORCINE) 1000 UNIT/ML IJ SOLN
INTRAMUSCULAR | Status: AC
  Filled 2023-05-21: qty 10

## 2023-05-21 MED ORDER — BENZONATATE 100 MG PO CAPS: 200.0000 mg | ORAL_CAPSULE | Freq: Two times a day (BID) | ORAL | Status: DC | PRN

## 2023-05-21 MED ORDER — EPOETIN ALFA-EPBX 4000 UNIT/ML IJ SOLN
INTRAMUSCULAR | Status: AC
  Filled 2023-05-21: qty 1

## 2023-05-21 MED ORDER — ALTEPLASE 2 MG IJ SOLR: 2.0000 mg | Freq: Once | INTRAMUSCULAR | Status: DC | PRN

## 2023-05-21 MED ORDER — HEPARIN SODIUM (PORCINE) 1000 UNIT/ML DIALYSIS: 1000.0000 [IU] | INTRAMUSCULAR | Status: DC | PRN

## 2023-05-21 NOTE — Progress Notes (Addendum)
 Hemodialysis Note:  Received patient in bed to unit. Alert and oriented. Informed consent singed and in chart.  Treatment initiated: 1341 Treatment completed: 1650  Access used: Right internal jugular catheter Access issues: None  Patient requested to end treatment 21 minutes early. Unable to sign AMA due to wickness, two nurses signed AMA as witnessed.   Transported back to room, alert without acute distress. Report given to patient's RN.  Total UF removed: 1 Liter. UF decreased due to hypotension episodes Medications given: Retacrit 4000 units IV  Post HD weight: 84.5 Kg  [facility]

## 2023-05-21 NOTE — Progress Notes (Signed)
 [facility] Progress Note  Patient: Kyle Roberson    ZOX:[id number]  DOA: 05/05/2023     Date of Service: the patient was seen and examined on 05/21/2023  Chief Complaint  Patient presents with   Fall   Brief hospital course:  Kyle Roberson is a 83 y.o. male with medical history significant of ESRD-HD (MWF), HTN, HLD, DM, CAD with stent, dCHF, depression with anxiety, BPH, anemia, A-fib on Eliquis, left eye blindness, who presents with cough, shortness of breath, diarrhea, fall.   Per pt and his wife (I called his wife by phone), patient has productive cough, sore throat, SOB, malaise for more than 5 days.  No chest pain.  No fever or chills.  Patient has diarrhea for more than 10 days, with 2- 3 times of watery diarrhea each day.  No nausea, vomiting or abdominal pain.  Patient fell this morning, no loss of consciousness. He patient states that he was going to the bathroom when he suddenly felt weak in his gave out and fell.  No unilateral numbness or tingling in extremities.  No facial droop or slurred speech.  Patient states that he has missed several dialysis. His last dialysis was on Monday.    Data reviewed independently and ED Course: pt was found to have positive COVID 19, WBC 5.6, troponin 95 --> 105, potassium 4.7, bicarbonate 18, creatinine 10.02, BUN 102.  CT head negative. Chest x-ray showed cardiomegaly with vascular congestion.  Patient is placed on telemetry bed for observation.     EKG: I have personally reviewed.  A-fib, QTc 461, low voltage, poor IV progression, anteroseptal infarction pattern.  2/22: Hemodynamically stable, getting dialysis.  Lengthy discussion with wife on phone to make decision regarding SNF versus home health, patient does not want to go to rehab himself and becoming agitated and combative. Psych was also consulted for capacity evaluation-pending. Currently being managed with as needed Haldol.  2/23; yesterday evening patient was  transferred to stepdown due to softer blood pressure which shows initial poor response to IV fluid.  Later blood pressure started improving with addition of midodrine.  Patient is now at baseline and remained quite combative and rude, refusing most of the care.  Being transferred out of stepdown.  2/24: Patient had dialysis today.  Behavior remained the same.  Patient does not have any capacity to make any medical decision per psychiatry today.  Wife would like him to go to rehab.  TOC to start looking for a place.  2/25: Remained medically stable.  Awaiting SNF placement  2/26: Had dialysis today.  Awaiting SNF  2/27: Patient has been accepted at [facility] in [place]. CSW assistant with switching patient's HD center to [place] or [place]. Plan for repeat HD tomorrow. Refused CBG check during lunch.  2/28: Remained hemodynamically stable.  Awaiting dialysis arrangement at [facility] before discharging.  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 nightly and Seroquel 25 mg p.o. twice daily 2/20 psych consulted for further recommendation Patient remained agitated and eventually psych was able to see him today, patient does not have any capacity -Continue as needed Haldol  # COVID-19 virus infection: No fever, no leukocytosis, no oxygen desaturation.   Chest x-ray showed vascular congestion, no infiltration. -Bronchodilators, Mucinex 600 mg p.o. twice daily, Robitussin DM as needed for cough -Continue supportive care 2/15 still sob after HD, may benefit from steroids and breathing treatments S/p  Prednisone 40 mg p.o. daily x 3 days, Breo Ellipta inhaler Continue albuterol as needed 2/16 patient is refusing inhalers, started on nebulizer treatments if he agrees. 2/18 started Decadron 6 mg p.o. daily x 4 days til 2/21 2/21 decreased Decadron 4 mg p.o. daily for 2 days followed by  2 mg p.o. daily for 2 days due to mental status changes.  Steroids may be contributing. Continues to refuse certain treatments including inhalers and some medication.  # CAD (coronary artery disease) and Myocardial injury:  Troponin 894, but No chest pain and no significant EKG changes Continue to monitor on telemetry S/p ASA 81 mg and heparin IV infusion, discontinued on 2/14, due to GI bleed 2/14 continue to hold Eliquis for now Cardiology consulted, recommended no ischemic workup at this time, follow as an outpatient for Myoview when recovered after COVID infection TTE shows LVEF 55 to 60%, grade 2 diastolic dysfunction, no wall motion abnormality.  Severe pulmonary hypertension, RA moderately dilated. Moderate TR As per cardio patient may need right and left heart cath due to pulmonary hypertension and elevated troponin.  # Paroxysmal atrial fibrillation: -Tele monitoring - s/p heparin IV infusion, Hb dropped, suspected GI bleed. Patient was cleared by GI to continue heparin IV infusion.  2/15 resumed Eliquis 2.5 mg p.o. twice daily as per cardiology 2/27: Received his Eliquis last night and this morning.  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 Hemoglobin 8.9 yesterday.  Follow-up repeat CBC tomorrow  # ESRD on dialysis Nephrology following, had HD yesterday with 2 L removed. Continue hemodialysis MWF schedule Hyperphosphatemia, started PhosLo Will need new outpatient HD center in [place] after discharge to SNF  # Fall at home, initial encounter: CT-head negative -Fall precaution, CK level wnl -PT/OT -recommending SNF  # Type II diabetes mellitus with renal manifestations:  Recent A1c 7.3, poorly controlled for  patient taking glipizide at home -SSI 2/18 started Semglee 5 units subcu daily and NovoLog 4 units 3 times daily due to hyperglycemia secondary to steroids. Follow diabetic coordinator and titrate insulin dose accordingly. Monitor CBG and continue diabetic diet 2/27 intermittently receiving insulin or CBG checks. Fasting blood sugar 124 this morning.   # Essential hypertension: Patient's not taking medications.   Nonsudden V. tach episode 7 beats run at 630 on 2/13 reported 2/13 started metoprolol 12.5 mg p.o. 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 Difficile negative Now patient developed constipation.  # Constipation, started laxatives on 2/19 2/21 started Dulcolax p.o. Use suppository and enema as needed  Body mass index is 27.84 kg/m.  Interventions:  Diet: Renal diet DVT Prophylaxis: Eliquis  Advance goals of care discussion: Full code  Family Communication: Discussed with wife at bedside  Disposition:  Patient  needs SNF due to worsening functional status but declining.  A lot of behavioral issues  Subjective: Patient was seen and examined today.  No new concern.  Wife was at bedside.  Physical Exam: General.  Frail elderly man, in no acute distress. Pulmonary.  Lungs clear bilaterally, normal respiratory effort. CV.  Regular rate and rhythm, no JVD, rub or murmur. Abdomen.  Soft, nontender, nondistended, BS positive. CNS.  Alert and oriented .  No focal neurologic deficit. Extremities.  No edema, no cyanosis, pulses intact and  symmetrical.   Vitals:   05/21/23 0730 05/21/23 1330 05/21/23 1341 05/21/23 1343  BP: (!) 109/54 (!) 113/36 (!) 118/44   Pulse: 65 98 60   Resp: 20 17 (!) 23   Temp: 97.8 F (36.6 C) 98.4 F (36.9 C)    TempSrc: Oral Oral    SpO2: 93% 96% 98%   Weight:    85.5 kg  Height:        Intake/Output Summary (Last 24 hours) at 05/21/2023 1414 Last data filed at 05/21/2023 1041 Gross per 24 hour  Intake 240 ml  Output --  Net 240 ml     Filed Weights   05/19/23 0844 05/20/23 0500 05/21/23 1343  Weight: 87.5 kg 89.4 kg 85.5 kg    Data Reviewed: Prior data reviewed.  CBC: Recent Labs  Lab 05/15/23 0900 05/17/23 0932 05/19/23 0850 05/21/23 1347  WBC 13.5* 10.6* 11.4* 7.4  NEUTROABS  --  9.1*  --   --   HGB 9.5* 9.1* 8.9* 8.5*  HCT 28.9* 27.9* 27.8* 26.8*  MCV 96.7 97.2 97.9 98.9  PLT 231 199 207 232   Basic Metabolic Panel: Recent Labs  Lab 05/15/23 0900 05/17/23 0932 05/19/23 0850 05/21/23 1347  NA 140 138 135 134*  K 5.3* 5.0 4.4 4.5  CL 100 99 97* 97*  CO2 21* 21* 24 22  GLUCOSE 106* 222* 106* 263*  BUN 109* 96* 71* 59*  CREATININE 9.15* 7.49* 6.51* 6.40*  CALCIUM 8.3* 7.6* 7.9* 7.6*  PHOS 7.7* 7.4* 4.7* 3.3    Studies: No results found.   Scheduled Meds:  apixaban  2.5 mg Oral BID   ascorbic acid  250 mg Oral Daily   bisacodyl  10 mg Oral QHS   budesonide (PULMICORT) nebulizer solution  0.5 mg Nebulization BID   calcium acetate  2,001 mg Oral TID WC   Chlorhexidine Gluconate Cloth  6 each Topical Daily   clotrimazole-betamethasone   Topical BID   dorzolamide-timolol  1 drop Both Eyes Daily   epoetin alfa-epbx (RETACRIT) injection  4,000 Units Intravenous Q M,W,F-HD   feeding supplement (NEPRO CARB STEADY)  237 mL Oral BID BM   guaiFENesin  600 mg Oral BID   insulin aspart  0-5 Units Subcutaneous QHS   insulin aspart  0-9 Units Subcutaneous TID WC   insulin aspart  4 Units Subcutaneous TID WC   insulin glargine-yfgn  5 Units Subcutaneous Daily    ipratropium-albuterol  3 mL Nebulization BID   midodrine  10 mg Oral TID WC   multivitamin  1 tablet Oral QHS   neomycin-polymyxin b-dexamethasone  1 Application Right Eye TID   pantoprazole  40 mg Oral BID   polyethylene glycol  17 g Oral BID   QUEtiapine  50 mg Oral BID   rosuvastatin  10 mg Oral Once per day on Monday Wednesday Friday   traZODone  50 mg Oral QHS   Continuous Infusions:    PRN Meds: acetaminophen, albuterol, benzonatate, bisacodyl, dextromethorphan-guaiFENesin, hydrALAZINE, HYDROmorphone **OR** HYDROmorphone (DILAUDID) injection, loperamide, nitroGLYCERIN, ondansetron (ZOFRAN) IV  Time  spent: 39 minutes  Author: Arnetha Courser MD [facility] 05/21/2023 2:14 PM  To reach On-call, see care teams to locate the attending and reach out to them via [email or web address]. If 7PM-7AM, please contact night-coverage If you still have difficulty reaching the attending provider, please page the [facility] (Director on Call) for [facility] on amion for assistance.

## 2023-05-21 NOTE — TOC Progression Note (Signed)
 Transition of Care [facility]) - Progression Note    Patient Details  Name: Kyle Roberson MRN: [id number] Date of Birth: [birth year]  Transition of Care [facility]) CM/SW Contact  Liliana Cline, LCSW Phone Number: 05/21/2023, 12:01 PM  Clinical Narrative:    CSW called patient's wife. She states she called patient's [facility] plan and they stated they would pay for the transport to the SNF in [place], [place] and that they do not have a preferred EMS provider. She states she also called [employer] and would like to use them for patient's transport to [facility] - [phone]. She is [place] has to [place] before [place].  CSW called HD Coordinator Marchelle Folks who states the HD placement is pending and she will keep CSW updated.   Expected Discharge Plan: Home/Self Care Barriers to Discharge: Continued Medical Work up  Expected Discharge Plan and Services   Discharge Planning Services: CM Consult   Living arrangements for the past 2 months: Single Family Home                 DME Arranged: N/A DME Agency: NA       HH Arranged: Refused SNF, Patient Refused HH HH Agency: NA         Social Determinants of Health (SDOH) Interventions SDOH Screenings   Food Insecurity: No Food Insecurity (05/07/2023)  Housing: Low Risk  (05/08/2023)  Transportation Needs: No Transportation Needs (05/08/2023)  Utilities: Not At Risk (05/08/2023)  Depression (PHQ2-9): Low Risk  (07/27/2022)  Tobacco Use: Medium Risk (05/05/2023)    Readmission Risk Interventions    05/12/2023   10:07 AM  Readmission Risk Prevention Plan  Transportation Screening Complete  Medication Review (RN Care Manager) Referral to Pharmacy  PCP or Specialist appointment within 3-5 days of discharge Complete  HRI or Home Care Consult Complete  Palliative Care Screening Not Applicable  Skilled Nursing Facility Not Applicable

## 2023-05-21 NOTE — Progress Notes (Signed)
 [facility]  ROUNDING NOTE   Subjective:   Kyle Roberson is a 83 y.o. male with diabetes mellitus type II, hypertension, coronary artery disease, chronic diastolic congestive heart failure, atrial fibrillation, pulmonary hypertension, and historyof subdural hematoma who is admitted for Weakness [R53.1] Fall, initial encounter [W19.XXXA] COVID-19 virus infection [U07.1] COVID-19 [U07.1] Uremia of renal origin [N19]  Patient is known to our practice and receives outpatient dialysis at [facility] on a MWF, supervised by Dr Cherylann Ratel. He received his last treatment on Wednesday.   Update:  Patient seen lying in bed Somnolent today Wife at bedside Room air   Objective:  Vital signs in last 24 hours:  Temp:  [97.8 F (36.6 C)-98.7 F (37.1 C)] 97.8 F (36.6 C) (02/28 0730) Pulse Rate:  [49-65] 65 (02/28 0730) Resp:  [18-20] 20 (02/28 0730) BP: (106-129)/(54-80) 109/54 (02/28 0730) SpO2:  [93 %-99 %] 93 % (02/28 0730)  Weight change:  Filed Weights   05/19/23 0422 05/19/23 0844 05/20/23 0500  Weight: 89.8 kg 87.5 kg 89.4 kg    Intake/Output: I/O last 3 completed shifts: In: 120 [P.O.:120] Out: -    Intake/Output this shift:  Total I/O In: 240 [P.O.:240] Out: -   Physical Exam: General: NAD  Head: Normocephalic, atraumatic. Moist oral mucosal membranes  Eyes: Anicteric  Lungs:  Clear to auscultation, room air  Heart: Regular rate and rhythm  Abdomen:  Soft, nontender  Extremities: Trace to 1+ peripheral edema.  Neurologic: Somnolent, moving all four extremities  Skin: Scaly bilateral lower extremities  Access: Right chest tunneled access    Basic Metabolic Panel: Recent Labs  Lab 05/15/23 0900 05/17/23 0932 05/19/23 0850  NA 140 138 135  K 5.3* 5.0 4.4  CL 100 99 97*  CO2 21* 21* 24  GLUCOSE 106* 222* 106*  BUN 109* 96* 71*  CREATININE 9.15* 7.49* 6.51*  CALCIUM 8.3* 7.6* 7.9*  PHOS 7.7* 7.4* 4.7*    Liver Function Tests: Recent  Labs  Lab 05/15/23 0900 05/17/23 0932 05/19/23 0850  ALBUMIN 2.6* 2.4* 2.5*    No results for input(s): "LIPASE", "AMYLASE" in the last 168 hours.  No results for input(s): "AMMONIA" in the last 168 hours.  CBC: Recent Labs  Lab 05/15/23 0900 05/17/23 0932 05/19/23 0850  WBC 13.5* 10.6* 11.4*  NEUTROABS  --  9.1*  --   HGB 9.5* 9.1* 8.9*  HCT 28.9* 27.9* 27.8*  MCV 96.7 97.2 97.9  PLT 231 199 207    Cardiac Enzymes: No results for input(s): "CKTOTAL", "CKMB", "CKMBINDEX", "TROPONINI" in the last 168 hours.   BNP: Invalid input(s): "POCBNP"  CBG: Recent Labs  Lab 05/19/23 2027 05/20/23 0816 05/20/23 2102 05/21/23 0734 05/21/23 1124  GLUCAP 162* 124* 96 206* 249*    Microbiology: Results for orders placed or performed during the hospital encounter of 05/05/23  Resp panel by RT-PCR (RSV, Flu A&B, Covid) Anterior Nasal Swab     Status: Abnormal   Collection Time: 05/05/23  4:33 PM   Specimen: Anterior Nasal Swab  Result Value Ref Range Status   SARS Coronavirus 2 by RT PCR POSITIVE (A) NEGATIVE Final    Comment: (NOTE) SARS-CoV-2 target nucleic acids are DETECTED.  The SARS-CoV-2 RNA is generally detectable in upper respiratory specimens during the acute phase of infection. Positive results are indicative of the presence of the identified virus, but do not rule out bacterial infection or co-infection with other pathogens not detected by the test. Clinical correlation with patient history and other  diagnostic information is necessary to determine patient infection status. The expected result is Negative.  Fact Sheet for Patients: BloggerCourse.com  Fact Sheet for Healthcare Providers: SeriousBroker.it  This test is not yet approved or cleared by the [place] FDA and  has been authorized for detection and/or diagnosis of SARS-CoV-2 by FDA under an Emergency Use Authorization (EUA).  This EUA  will remain in effect (meaning this test can be used) for the duration of  the COVID-19 declaration under Section 564(b)(1) of the A ct, 21 U.S.C. section 360bbb-3(b)(1), unless the authorization is terminated or revoked sooner.     Influenza A by PCR NEGATIVE NEGATIVE Final   Influenza B by PCR NEGATIVE NEGATIVE Final    Comment: (NOTE) The Xpert Xpress SARS-CoV-2/FLU/RSV plus assay is intended as an aid in the diagnosis of influenza from Nasopharyngeal swab specimens and should not be used as a sole basis for treatment. Nasal washings and aspirates are unacceptable for Xpert Xpress SARS-CoV-2/FLU/RSV testing.  Fact Sheet for Patients: BloggerCourse.com  Fact Sheet for Healthcare Providers: SeriousBroker.it  This test is not yet approved or cleared by the [place] FDA and has been authorized for detection and/or diagnosis of SARS-CoV-2 by FDA under an Emergency Use Authorization (EUA). This EUA will remain in effect (meaning this test can be used) for the duration of the COVID-19 declaration under Section 564(b)(1) of the Act, 21 U.S.C. section 360bbb-3(b)(1), unless the authorization is terminated or revoked.     Resp Syncytial Virus by PCR NEGATIVE NEGATIVE Final    Comment: (NOTE) Fact Sheet for Patients: BloggerCourse.com  Fact Sheet for Healthcare Providers: SeriousBroker.it  This test is not yet approved or cleared by the [place] FDA and has been authorized for detection and/or diagnosis of SARS-CoV-2 by FDA under an Emergency Use Authorization (EUA). This EUA will remain in effect (meaning this test can be used) for the duration of the COVID-19 declaration under Section 564(b)(1) of the Act, 21 U.S.C. section 360bbb-3(b)(1), unless the authorization is terminated or revoked.  Performed at [facility], [address] [place], [place]  [address]   C Difficile Quick Screen w PCR reflex     Status: None   Collection Time: 05/05/23  7:35 PM   Specimen: STOOL  Result Value Ref Range Status   C Diff antigen NEGATIVE NEGATIVE Final   C Diff toxin NEGATIVE NEGATIVE Final   C Diff interpretation No C. difficile detected.  Final    Comment: Performed at [facility], [address] [place], [place] [address]  MRSA Next Gen by PCR, Nasal     Status: None   Collection Time: 05/15/23  6:37 PM   Specimen: Nasal Mucosa; Nasal Swab  Result Value Ref Range Status   MRSA by PCR Next Gen NOT DETECTED NOT DETECTED Final    Comment: (NOTE) The GeneXpert MRSA Assay (FDA approved for NASAL specimens only), is one component of a comprehensive MRSA colonization surveillance program. It is not intended to diagnose MRSA infection nor to guide or monitor treatment for MRSA infections. Test performance is not FDA approved in patients less than 18 years old. Performed at [facility], [address] [place], [place] [address]     Coagulation Studies: No results for input(s): "LABPROT", "INR" in the last 72 hours.   Urinalysis: No results for input(s): "COLORURINE", "LABSPEC", "PHURINE", "GLUCOSEU", "HGBUR", "BILIRUBINUR", "KETONESUR", "PROTEINUR", "UROBILINOGEN", "NITRITE", "LEUKOCYTESUR" in the last 72 hours.  Invalid input(s): "APPERANCEUR"    Imaging: No results found.  Medications:       apixaban  2.5 mg Oral BID   ascorbic acid  250 mg Oral Daily   bisacodyl  10 mg Oral QHS   budesonide (PULMICORT) nebulizer solution  0.5 mg Nebulization BID   calcium acetate  2,001 mg Oral TID WC   Chlorhexidine Gluconate Cloth  6 each Topical Daily   clotrimazole-betamethasone   Topical BID   dorzolamide-timolol  1 drop Both Eyes Daily   epoetin alfa-epbx (RETACRIT) injection  4,000 Units Intravenous Q M,W,F-HD   feeding supplement (NEPRO CARB STEADY)  237 mL Oral BID BM   guaiFENesin  600 mg Oral BID    insulin aspart  0-5 Units Subcutaneous QHS   insulin aspart  0-9 Units Subcutaneous TID WC   insulin aspart  4 Units Subcutaneous TID WC   insulin glargine-yfgn  5 Units Subcutaneous Daily   ipratropium-albuterol  3 mL Nebulization BID   midodrine  10 mg Oral TID WC   multivitamin  1 tablet Oral QHS   neomycin-polymyxin b-dexamethasone  1 Application Right Eye TID   pantoprazole  40 mg Oral BID   polyethylene glycol  17 g Oral BID   QUEtiapine  50 mg Oral BID   rosuvastatin  10 mg Oral Once per day on Monday Wednesday Friday   traZODone  50 mg Oral QHS   acetaminophen, albuterol, bisacodyl, dextromethorphan-guaiFENesin, hydrALAZINE, HYDROmorphone **OR** HYDROmorphone (DILAUDID) injection, loperamide, nitroGLYCERIN, ondansetron (ZOFRAN) IV  Assessment/ Plan:  [name] is a 83 y.o.  male with diabetes mellitus type II, hypertension, coronary artery disease, chronic diastolic congestive heart failure, atrial fibrillation, pulmonary hypertension, and historyof subdural hematoma who is admitted for Weakness [R53.1] Fall, initial encounter [W19.XXXA] COVID-19 virus infection [U07.1] COVID-19 [U07.1] Uremia of renal origin [N19]  [facility]/MWF/right chest PermCath  End-stage renal disease on hemodialysis.  Patient scheduled to receive dialysis later today.  COVID-19, found positive this admission.  Isolation discontinued on 05/15/23.  Room air.  Respiratory status stable  3. Anemia of chronic kidney disease  Lab Results  Component Value Date   HGB 8.9 (L) 05/19/2023   Continue Epogen 4000 units IV with dialysis treatments.  4. Secondary Hyperparathyroidism: with outpatient labs: None available  Lab Results  Component Value Date   PTH 344 (H) 01/07/2023   CALCIUM 7.9 (L) 05/19/2023   CAION 1.14 (L) 12/02/2022   PHOS 4.7 (H) 05/19/2023    Will continue to monitor bone minerals.  Continue calcium acetate 3 times daily with meals.   LOS: 14 Kyle Roberson 2/28/202512:59 PM

## 2023-05-21 NOTE — Plan of Care (Signed)
  Problem: Education: Goal: Knowledge of risk factors and measures for prevention of condition will improve Outcome: Progressing   Problem: Coping: Goal: Psychosocial and spiritual needs will be supported Outcome: Progressing   Problem: Respiratory: Goal: Will maintain a patent airway Outcome: Progressing Goal: Complications related to the disease process, condition or treatment will be avoided or minimized Outcome: Progressing   Problem: Education: Goal: Knowledge of General Education information will improve Description: Including pain rating scale, medication(s)/side effects and non-pharmacologic comfort measures Outcome: Progressing   Problem: Health Behavior/Discharge Planning: Goal: Ability to manage health-related needs will improve Outcome: Progressing   Problem: Clinical Measurements: Goal: Ability to maintain clinical measurements within normal limits will improve Outcome: Progressing Goal: Will remain free from infection Outcome: Progressing Goal: Diagnostic test results will improve Outcome: Progressing Goal: Respiratory complications will improve Outcome: Progressing Goal: Cardiovascular complication will be avoided Outcome: Progressing   Problem: Activity: Goal: Risk for activity intolerance will decrease Outcome: Progressing   Problem: Nutrition: Goal: Adequate nutrition will be maintained Outcome: Progressing   Problem: Coping: Goal: Level of anxiety will decrease Outcome: Progressing   Problem: Elimination: Goal: Will not experience complications related to bowel motility Outcome: Progressing Goal: Will not experience complications related to urinary retention Outcome: Progressing   Problem: Pain Managment: Goal: General experience of comfort will improve and/or be controlled Outcome: Progressing   Problem: Safety: Goal: Ability to remain free from injury will improve Outcome: Progressing   Problem: Skin Integrity: Goal: Risk for impaired  skin integrity will decrease Outcome: Progressing   Problem: Education: Goal: Ability to describe self-care measures that may prevent or decrease complications (Diabetes Survival Skills Education) will improve Outcome: Progressing Goal: Individualized Educational Video(s) Outcome: Progressing   Problem: Coping: Goal: Ability to adjust to condition or change in health will improve Outcome: Progressing   Problem: Fluid Volume: Goal: Ability to maintain a balanced intake and output will improve Outcome: Progressing   Problem: Health Behavior/Discharge Planning: Goal: Ability to identify and utilize available resources and services will improve Outcome: Progressing Goal: Ability to manage health-related needs will improve Outcome: Progressing   Problem: Metabolic: Goal: Ability to maintain appropriate glucose levels will improve Outcome: Progressing   Problem: Nutritional: Goal: Maintenance of adequate nutrition will improve Outcome: Progressing Goal: Progress toward achieving an optimal weight will improve Outcome: Progressing   Problem: Skin Integrity: Goal: Risk for impaired skin integrity will decrease Outcome: Progressing   Problem: Tissue Perfusion: Goal: Adequacy of tissue perfusion will improve Outcome: Progressing

## 2023-05-22 DIAGNOSIS — U071 COVID-19: Secondary | ICD-10-CM | POA: Diagnosis not present

## 2023-05-22 DIAGNOSIS — I480 Paroxysmal atrial fibrillation: Secondary | ICD-10-CM | POA: Diagnosis not present

## 2023-05-22 DIAGNOSIS — I25118 Atherosclerotic heart disease of native coronary 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: Secondary | ICD-10-CM | POA: Diagnosis not present

## 2023-05-22 DIAGNOSIS — E1122 Type 2 diabetes mellitus with diabetic chronic kidney disease: Secondary | ICD-10-CM | POA: Diagnosis not present

## 2023-05-22 LAB — GLUCOSE, CAPILLARY
Glucose-Capillary: 133 mg/dL — ABNORMAL HIGH (ref 70–99)
Glucose-Capillary: 215 mg/dL — ABNORMAL HIGH (ref 70–99)

## 2023-05-22 MED ORDER — CALCIUM ACETATE (PHOS BINDER) 667 MG PO CAPS
667.0000 mg | ORAL_CAPSULE | Freq: Three times a day (TID) | ORAL | Status: DC
  Administered 2023-05-23 – 2023-05-24 (×4): 667 mg via ORAL
  Filled 2023-05-22 (×6): qty 1

## 2023-05-22 NOTE — Progress Notes (Signed)
 Pt. Declined PIV placemen, no current IV meds/IVF scheduled.t, RN at bedside.

## 2023-05-22 NOTE — Plan of Care (Signed)
  Problem: Education: Goal: Knowledge of risk factors and measures for prevention of condition will improve Outcome: Progressing   Problem: Coping: Goal: Psychosocial and spiritual needs will be supported Outcome: Progressing   Problem: Respiratory: Goal: Will maintain a patent airway Outcome: Progressing Goal: Complications related to the disease process, condition or treatment will be avoided or minimized Outcome: Progressing   Problem: Education: Goal: Knowledge of General Education information will improve Description: Including pain rating scale, medication(s)/side effects and non-pharmacologic comfort measures Outcome: Progressing   Problem: Health Behavior/Discharge Planning: Goal: Ability to manage health-related needs will improve Outcome: Progressing   Problem: Clinical Measurements: Goal: Ability to maintain clinical measurements within normal limits will improve Outcome: Progressing Goal: Will remain free from infection Outcome: Progressing Goal: Diagnostic test results will improve Outcome: Progressing Goal: Respiratory complications will improve Outcome: Progressing Goal: Cardiovascular complication will be avoided Outcome: Progressing   Problem: Activity: Goal: Risk for activity intolerance will decrease Outcome: Progressing   Problem: Nutrition: Goal: Adequate nutrition will be maintained Outcome: Progressing   Problem: Coping: Goal: Level of anxiety will decrease Outcome: Progressing   Problem: Elimination: Goal: Will not experience complications related to bowel motility Outcome: Progressing Goal: Will not experience complications related to urinary retention Outcome: Progressing   Problem: Pain Managment: Goal: General experience of comfort will improve and/or be controlled Outcome: Progressing   Problem: Safety: Goal: Ability to remain free from injury will improve Outcome: Progressing   Problem: Skin Integrity: Goal: Risk for impaired  skin integrity will decrease Outcome: Progressing   Problem: Education: Goal: Ability to describe self-care measures that may prevent or decrease complications (Diabetes Survival Skills Education) will improve Outcome: Progressing Goal: Individualized Educational Video(s) Outcome: Progressing   Problem: Coping: Goal: Ability to adjust to condition or change in health will improve Outcome: Progressing   Problem: Fluid Volume: Goal: Ability to maintain a balanced intake and output will improve Outcome: Progressing   Problem: Health Behavior/Discharge Planning: Goal: Ability to identify and utilize available resources and services will improve Outcome: Progressing Goal: Ability to manage health-related needs will improve Outcome: Progressing   Problem: Metabolic: Goal: Ability to maintain appropriate glucose levels will improve Outcome: Progressing   Problem: Nutritional: Goal: Maintenance of adequate nutrition will improve Outcome: Progressing Goal: Progress toward achieving an optimal weight will improve Outcome: Progressing   Problem: Skin Integrity: Goal: Risk for impaired skin integrity will decrease Outcome: Progressing   Problem: Tissue Perfusion: Goal: Adequacy of tissue perfusion will improve Outcome: Progressing

## 2023-05-22 NOTE — Progress Notes (Signed)
 [facility] Progress Note  Patient: Kyle Roberson    JYN:[id number]  DOA: 05/05/2023     Date of Service: the patient was seen and examined on 05/22/2023  Chief Complaint  Patient presents with   Fall   Brief hospital course:  Kyle Roberson is a 83 y.o. male with medical history significant of ESRD-HD (MWF), HTN, HLD, DM, CAD with stent, dCHF, depression with anxiety, BPH, anemia, A-fib on Eliquis, left eye blindness, who presents with cough, shortness of breath, diarrhea, fall.   Per pt and his wife (I called his wife by phone), patient has productive cough, sore throat, SOB, malaise for more than 5 days.  No chest pain.  No fever or chills.  Patient has diarrhea for more than 10 days, with 2- 3 times of watery diarrhea each day.  No nausea, vomiting or abdominal pain.  Patient fell this morning, no loss of consciousness. He patient states that he was going to the bathroom when he suddenly felt weak in his gave out and fell.  No unilateral numbness or tingling in extremities.  No facial droop or slurred speech.  Patient states that he has missed several dialysis. His last dialysis was on Monday.    Data reviewed independently and ED Course: pt was found to have positive COVID 19, WBC 5.6, troponin 95 --> 105, potassium 4.7, bicarbonate 18, creatinine 10.02, BUN 102.  CT head negative. Chest x-ray showed cardiomegaly with vascular congestion.  Patient is placed on telemetry bed for observation.     EKG: I have personally reviewed.  A-fib, QTc 461, low voltage, poor IV progression, anteroseptal infarction pattern.  2/22: Hemodynamically stable, getting dialysis.  Lengthy discussion with wife on phone to make decision regarding SNF versus home health, patient does not want to go to rehab himself and becoming agitated and combative. Psych was also consulted for capacity evaluation-pending. Currently being managed with as needed Haldol.  2/23; yesterday evening patient was transferred  to stepdown due to softer blood pressure which shows initial poor response to IV fluid.  Later blood pressure started improving with addition of midodrine.  Patient is now at baseline and remained quite combative and rude, refusing most of the care.  Being transferred out of stepdown.  2/24: Patient had dialysis today.  Behavior remained the same.  Patient does not have any capacity to make any medical decision per psychiatry today.  Wife would like him to go to rehab.  TOC to start looking for a place.  2/25: Remained medically stable.  Awaiting SNF placement  2/26: Had dialysis today.  Awaiting SNF  2/27: Patient has been accepted at [facility] in [place]. CSW assistant with switching patient's HD center to [place] or [place]. Plan for repeat HD tomorrow. Refused CBG check during lunch.  2/28: Remained hemodynamically stable.  Awaiting dialysis arrangement at [facility] before discharging.  3/1: Remained hemodynamically stable.  Likely will go to rehab on Monday.  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 nightly and Seroquel 25 mg p.o. twice daily 2/20 psych consulted for further recommendation Patient remained agitated and eventually psych was able to see him today, patient does not have any capacity -Continue as needed Haldol  # COVID-19 virus infection: No fever, no leukocytosis, no oxygen desaturation.   Chest x-ray showed vascular congestion, no infiltration. -Bronchodilators, Mucinex 600 mg p.o. twice daily, Robitussin DM as needed for cough -Continue supportive care  2/15 still sob after HD, may benefit from steroids and breathing treatments S/p Prednisone 40 mg p.o. daily x 3 days, Breo Ellipta inhaler Continue albuterol as needed 2/16 patient is refusing inhalers, started on nebulizer treatments if he agrees. 2/18 started Decadron 6 mg p.o. daily x 4 days til  2/21 2/21 decreased Decadron 4 mg p.o. daily for 2 days followed by 2 mg p.o. daily for 2 days due to mental status changes.  Steroids may be contributing. Continues to refuse certain treatments including inhalers and some medication.  # CAD (coronary artery disease) and Myocardial injury:  Troponin 894, but No chest pain and no significant EKG changes Continue to monitor on telemetry S/p ASA 81 mg and heparin IV infusion, discontinued on 2/14, due to GI bleed 2/14 continue to hold Eliquis for now Cardiology consulted, recommended no ischemic workup at this time, follow as an outpatient for Myoview when recovered after COVID infection TTE shows LVEF 55 to 60%, grade 2 diastolic dysfunction, no wall motion abnormality.  Severe pulmonary hypertension, RA moderately dilated. Moderate TR As per cardio patient may need right and left heart cath due to pulmonary hypertension and elevated troponin.  # Paroxysmal atrial fibrillation: -Tele monitoring - s/p heparin IV infusion, Hb dropped, suspected GI bleed. Patient was cleared by GI to continue heparin IV infusion.  2/15 resumed Eliquis 2.5 mg p.o. twice daily as per cardiology 2/27: Received his Eliquis last night and this morning.  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 Hemoglobin 8.9 yesterday.  Follow-up repeat CBC tomorrow  # ESRD on dialysis Nephrology following, had HD yesterday with 2 L removed. Continue hemodialysis MWF schedule Hyperphosphatemia, started PhosLo Will need new outpatient HD center in [place] after discharge to SNF  # Fall at home, initial encounter: CT-head negative -Fall precaution, CK level wnl -PT/OT -recommending SNF  # Type II diabetes mellitus  with renal manifestations:  Recent A1c 7.3, poorly controlled for patient taking glipizide at home -SSI 2/18 started Semglee 5 units subcu daily and NovoLog 4 units 3 times daily due to hyperglycemia secondary to steroids. Follow diabetic coordinator and titrate insulin dose accordingly. Monitor CBG and continue diabetic diet 2/27 intermittently receiving insulin or CBG checks. Fasting blood sugar 124 this morning.   # Essential hypertension: Patient's not taking medications.   Nonsudden V. tach episode 7 beats run at 630 on 2/13 reported 2/13 started metoprolol 12.5 mg p.o. 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 Difficile negative Now patient developed constipation.  # Constipation, started laxatives on 2/19 2/21 started Dulcolax p.o. Use suppository and enema as needed  Body mass index is 28.43 kg/m.  Interventions:  Diet: Renal diet DVT Prophylaxis: Eliquis  Advance goals of care discussion: Full code  Family Communication:   Disposition:  Patient  needs SNF due to worsening functional status  but declining.  A lot of behavioral issues   Subjective: Patient with no new concern.  Physical Exam: General.  Frail elderly man, in no acute distress. Pulmonary.  Lungs clear bilaterally, normal respiratory effort. CV.  Regular rate and rhythm, no JVD, rub or murmur. Abdomen.  Soft, nontender, nondistended, BS positive. CNS.  Alert and oriented .  No focal neurologic deficit. Extremities.  No edema, no cyanosis, pulses intact and  symmetrical.   Vitals:   05/22/23 0426 05/22/23 0500 05/22/23 0823 05/22/23 0840  BP: (!) 120/51  (!) 106/50   Pulse: 63  79 60  Resp:   14 18  Temp: (!) 97.4 F (36.3 C)  97.7 F (36.5 C)   TempSrc: Axillary     SpO2: 97%  96% 98%  Weight:  87.3 kg    Height:        Intake/Output Summary (Last 24 hours) at 05/22/2023 1349 Last data filed at 05/21/2023 1650 Gross per 24 hour  Intake --  Output 1000 ml  Net -1000 ml     Filed Weights   05/21/23 1343 05/21/23 1650 05/22/23 0500  Weight: 85.5 kg 84.5 kg 87.3 kg    Data Reviewed: Prior data reviewed.  CBC: Recent Labs  Lab 05/17/23 0932 05/19/23 0850 05/21/23 1347  WBC 10.6* 11.4* 7.4  NEUTROABS 9.1*  --   --   HGB 9.1* 8.9* 8.5*  HCT 27.9* 27.8* 26.8*  MCV 97.2 97.9 98.9  PLT 199 207 232   Basic Metabolic Panel: Recent Labs  Lab 05/17/23 0932 05/19/23 0850 05/21/23 1347  NA 138 135 134*  K 5.0 4.4 4.5  CL 99 97* 97*  CO2 21* 24 22  GLUCOSE 222* 106* 263*  BUN 96* 71* 59*  CREATININE 7.49* 6.51* 6.40*  CALCIUM 7.6* 7.9* 7.6*  PHOS 7.4* 4.7* 3.3    Studies: No results found.   Scheduled Meds:  apixaban  2.5 mg Oral BID   ascorbic acid  250 mg Oral Daily   bisacodyl  10 mg Oral QHS   budesonide (PULMICORT) nebulizer solution  0.5 mg Nebulization BID   calcium acetate  667 mg Oral TID WC   Chlorhexidine Gluconate Cloth  6 each Topical Daily   clotrimazole-betamethasone   Topical BID   dorzolamide-timolol  1 drop Both Eyes Daily   epoetin alfa-epbx (RETACRIT) injection  4,000 Units Intravenous Q M,W,F-HD   feeding supplement (NEPRO CARB STEADY)  237 mL Oral BID BM   guaiFENesin  600 mg Oral BID   insulin aspart  0-5 Units Subcutaneous QHS   insulin aspart  0-9 Units Subcutaneous TID WC   insulin aspart  4 Units Subcutaneous TID WC   insulin glargine-yfgn  5 Units Subcutaneous Daily   ipratropium-albuterol  3 mL Nebulization BID   midodrine  10 mg Oral TID WC   multivitamin  1 tablet Oral QHS    neomycin-polymyxin b-dexamethasone  1 Application Right Eye TID   pantoprazole  40 mg Oral BID   polyethylene glycol  17 g Oral BID   QUEtiapine  50 mg Oral BID   rosuvastatin  10 mg Oral Once per day on Monday Wednesday Friday   traZODone  50 mg Oral QHS   Continuous Infusions:    PRN Meds: acetaminophen, albuterol, benzonatate, bisacodyl, dextromethorphan-guaiFENesin, hydrALAZINE, HYDROmorphone **OR** HYDROmorphone (DILAUDID) injection, loperamide, nitroGLYCERIN, ondansetron (ZOFRAN) IV  Time spent: 40 minutes  Author: Arnetha Courser MD [facility] 05/22/2023 1:49 PM  To reach On-call, see care teams to locate  the attending and reach out to them via [email or web address]. If 7PM-7AM, please contact night-coverage If you still have difficulty reaching the attending provider, please page the [facility] (Director on Call) for [facility] on amion for assistance.

## 2023-05-22 NOTE — Progress Notes (Addendum)
 Pt PIV dislodged during dressing change. Discussed with pt about replacing IV . IV team arrived for PIV placement approx at 0640-- pt refused IV insertion. Provider on call notified. MD okay with no IV access at this time

## 2023-05-22 NOTE — Progress Notes (Signed)
 [facility]  ROUNDING NOTE   Subjective:  Kyle Roberson is a 83 y.o. male with diabetes mellitus type II, hypertension, coronary artery disease, chronic diastolic congestive heart failure, atrial fibrillation, pulmonary hypertension, and historyof subdural hematoma who is admitted for Weakness [R53.1] Fall, initial encounter [W19.XXXA] COVID-19 virus infection [U07.1] COVID-19 [U07.1] Uremia of renal origin [N19]   Patient is known to our practice and receives outpatient dialysis at [facility] on a MWF, supervised by Dr Cherylann Ratel. He received his last treatment on Wednesday.    Update: Patient seen in room talking on phone with wife. Patient denies shortness of breath. Patient reports poor oral intake.   Objective:  Vital signs in last 24 hours:  Temp:  [97.4 F (36.3 C)-98.4 F (36.9 C)] 97.7 F (36.5 C) (03/01 0823) Pulse Rate:  [51-114] 60 (03/01 0840) Resp:  [14-25] 18 (03/01 0840) BP: (92-120)/(36-95) 106/50 (03/01 0823) SpO2:  [95 %-99 %] 98 % (03/01 0840) Weight:  [84.5 kg-87.3 kg] 87.3 kg (03/01 0500)  Weight change:  Filed Weights   05/21/23 1343 05/21/23 1650 05/22/23 0500  Weight: 85.5 kg 84.5 kg 87.3 kg    Intake/Output: I/O last 3 completed shifts: In: 240 [P.O.:240] Out: 1000 [Other:1000]   Intake/Output this shift:  No intake/output data recorded.  Physical Exam: General: NAD,   Head: Normocephalic, atraumatic. Moist oral mucosal membranes  Eyes: Anicteric, PERRL  Neck: Supple, trachea midline  Lungs:  Clear to auscultation  Heart: Regular rate and rhythm  Abdomen:  Soft, nontender,   Extremities:  No peripheral edema.  Neurologic: Nonfocal, moving all four extremities  Skin: No lesions  Access: Rt chest permcath    Basic Metabolic Panel: Recent Labs  Lab 05/17/23 0932 05/19/23 0850 05/21/23 1347  NA 138 135 134*  K 5.0 4.4 4.5  CL 99 97* 97*  CO2 21* 24 22  GLUCOSE 222* 106* 263*  BUN 96* 71* 59*  CREATININE 7.49*  6.51* 6.40*  CALCIUM 7.6* 7.9* 7.6*  PHOS 7.4* 4.7* 3.3    Liver Function Tests: Recent Labs  Lab 05/17/23 0932 05/19/23 0850 05/21/23 1347  ALBUMIN 2.4* 2.5* 2.3*   No results for input(s): "LIPASE", "AMYLASE" in the last 168 hours. No results for input(s): "AMMONIA" in the last 168 hours.  CBC: Recent Labs  Lab 05/17/23 0932 05/19/23 0850 05/21/23 1347  WBC 10.6* 11.4* 7.4  NEUTROABS 9.1*  --   --   HGB 9.1* 8.9* 8.5*  HCT 27.9* 27.8* 26.8*  MCV 97.2 97.9 98.9  PLT 199 207 232    Cardiac Enzymes: No results for input(s): "CKTOTAL", "CKMB", "CKMBINDEX", "TROPONINI" in the last 168 hours.  BNP: Invalid input(s): "POCBNP"  CBG: Recent Labs  Lab 05/21/23 1124 05/21/23 1747 05/21/23 2233 05/22/23 0817 05/22/23 1154  GLUCAP 249* 133* 196* 133* 215*    Microbiology: Results for orders placed or performed during the hospital encounter of 05/05/23  Resp panel by RT-PCR (RSV, Flu A&B, Covid) Anterior Nasal Swab     Status: Abnormal   Collection Time: 05/05/23  4:33 PM   Specimen: Anterior Nasal Swab  Result Value Ref Range Status   SARS Coronavirus 2 by RT PCR POSITIVE (A) NEGATIVE Final    Comment: (NOTE) SARS-CoV-2 target nucleic acids are DETECTED.  The SARS-CoV-2 RNA is generally detectable in upper respiratory specimens during the acute phase of infection. Positive results are indicative of the presence of the identified virus, but do not rule out bacterial infection or co-infection with other pathogens not  detected by the test. Clinical correlation with patient history and other diagnostic information is necessary to determine patient infection status. The expected result is Negative.  Fact Sheet for Patients: BloggerCourse.com  Fact Sheet for Healthcare Providers: SeriousBroker.it  This test is not yet approved or cleared by the [place] FDA and  has been authorized for detection and/or  diagnosis of SARS-CoV-2 by FDA under an Emergency Use Authorization (EUA).  This EUA will remain in effect (meaning this test can be used) for the duration of  the COVID-19 declaration under Section 564(b)(1) of the A ct, 21 U.S.C. section 360bbb-3(b)(1), unless the authorization is terminated or revoked sooner.     Influenza A by PCR NEGATIVE NEGATIVE Final   Influenza B by PCR NEGATIVE NEGATIVE Final    Comment: (NOTE) The Xpert Xpress SARS-CoV-2/FLU/RSV plus assay is intended as an aid in the diagnosis of influenza from Nasopharyngeal swab specimens and should not be used as a sole basis for treatment. Nasal washings and aspirates are unacceptable for Xpert Xpress SARS-CoV-2/FLU/RSV testing.  Fact Sheet for Patients: BloggerCourse.com  Fact Sheet for Healthcare Providers: SeriousBroker.it  This test is not yet approved or cleared by the [place] FDA and has been authorized for detection and/or diagnosis of SARS-CoV-2 by FDA under an Emergency Use Authorization (EUA). This EUA will remain in effect (meaning this test can be used) for the duration of the COVID-19 declaration under Section 564(b)(1) of the Act, 21 U.S.C. section 360bbb-3(b)(1), unless the authorization is terminated or revoked.     Resp Syncytial Virus by PCR NEGATIVE NEGATIVE Final    Comment: (NOTE) Fact Sheet for Patients: BloggerCourse.com  Fact Sheet for Healthcare Providers: SeriousBroker.it  This test is not yet approved or cleared by the [place] FDA and has been authorized for detection and/or diagnosis of SARS-CoV-2 by FDA under an Emergency Use Authorization (EUA). This EUA will remain in effect (meaning this test can be used) for the duration of the COVID-19 declaration under Section 564(b)(1) of the Act, 21 U.S.C. section 360bbb-3(b)(1), unless the authorization is terminated  or revoked.  Performed at [facility], [address] [place], [place] [address]   C Difficile Quick Screen w PCR reflex     Status: None   Collection Time: 05/05/23  7:35 PM   Specimen: STOOL  Result Value Ref Range Status   C Diff antigen NEGATIVE NEGATIVE Final   C Diff toxin NEGATIVE NEGATIVE Final   C Diff interpretation No C. difficile detected.  Final    Comment: Performed at [facility], [address] [place], [place] [address]  MRSA Next Gen by PCR, Nasal     Status: None   Collection Time: 05/15/23  6:37 PM   Specimen: Nasal Mucosa; Nasal Swab  Result Value Ref Range Status   MRSA by PCR Next Gen NOT DETECTED NOT DETECTED Final    Comment: (NOTE) The GeneXpert MRSA Assay (FDA approved for NASAL specimens only), is one component of a comprehensive MRSA colonization surveillance program. It is not intended to diagnose MRSA infection nor to guide or monitor treatment for MRSA infections. Test performance is not FDA approved in patients less than 87 years old. Performed at [facility], [address] [place], [place] [address]     Coagulation Studies: No results for input(s): "LABPROT", "INR" in the last 72 hours.  Urinalysis: No results for input(s): "COLORURINE", "LABSPEC", "PHURINE", "GLUCOSEU", "HGBUR", "BILIRUBINUR", "KETONESUR", "PROTEINUR", "UROBILINOGEN", "NITRITE", "LEUKOCYTESUR" in the last 72 hours.  Invalid input(s): "APPERANCEUR"    Imaging: No results found.   Medications:     apixaban  2.5 mg Oral BID   ascorbic acid  250 mg Oral Daily   bisacodyl  10 mg Oral QHS   budesonide (PULMICORT) nebulizer solution  0.5 mg Nebulization BID   calcium acetate  2,001 mg Oral TID WC   Chlorhexidine Gluconate Cloth  6 each Topical Daily   clotrimazole-betamethasone   Topical BID   dorzolamide-timolol  1 drop Both Eyes Daily   epoetin alfa-epbx (RETACRIT) injection  4,000 Units Intravenous Q M,W,F-HD   feeding  supplement (NEPRO CARB STEADY)  237 mL Oral BID BM   guaiFENesin  600 mg Oral BID   insulin aspart  0-5 Units Subcutaneous QHS   insulin aspart  0-9 Units Subcutaneous TID WC   insulin aspart  4 Units Subcutaneous TID WC   insulin glargine-yfgn  5 Units Subcutaneous Daily   ipratropium-albuterol  3 mL Nebulization BID   midodrine  10 mg Oral TID WC   multivitamin  1 tablet Oral QHS   neomycin-polymyxin b-dexamethasone  1 Application Right Eye TID   pantoprazole  40 mg Oral BID   polyethylene glycol  17 g Oral BID   QUEtiapine  50 mg Oral BID   rosuvastatin  10 mg Oral Once per day on Monday Wednesday Friday   traZODone  50 mg Oral QHS   acetaminophen, albuterol, benzonatate, bisacodyl, dextromethorphan-guaiFENesin, hydrALAZINE, HYDROmorphone **OR** HYDROmorphone (DILAUDID) injection, loperamide, nitroGLYCERIN, ondansetron (ZOFRAN) IV  Assessment/ Plan:  Mr. Suliman Termini is a 83 y.o.  male with diabetes mellitus type II, hypertension, coronary artery disease, chronic diastolic congestive heart failure, atrial fibrillation, pulmonary hypertension, and historyof subdural hematoma who is admitted for Weakness [R53.1] Fall, initial encounter [W19.XXXA] COVID-19 virus infection [U07.1] COVID-19 [U07.1] Uremia of renal origin [N19]   [facility]/MWF/right chest PermCath   End-stage renal disease on hemodialysis.  Patient scheduled for dialysis on Monday.   COVID-19, found positive this admission.  Isolation discontinued on 05/15/23.  Room air.  Respiratory status stable   3. Anemia of chronic kidney disease  Recent Labs       Lab Results  Component Value Date    HGB 8.5 (L) 05/21/2023      Continue Epogen 4000 units IV with dialysis treatments.   4. Secondary Hyperparathyroidism: with outpatient labs: None available   Recent Labs       Lab Results  Component Value Date    PTH 344 (H) 01/07/2023    CALCIUM 7.6 (L) 05/21/2023    CAION 1.14 (L) 12/02/2022    PHOS  3.3 05/21/2023      Will continue to monitor bone minerals. Decreased calcium acetate to 1 tab, 3 times daily with meals.  5. Malnutrition Patient reports poor intake. Albumin 2.3 Dietary consult placed.   LOS: 15 Naara Kelty P Windell Moulding 3/1/202512:52 PM

## 2023-05-23 DIAGNOSIS — U071 COVID-19: Secondary | ICD-10-CM | POA: Diagnosis not present

## 2023-05-23 LAB — GLUCOSE, CAPILLARY
Glucose-Capillary: 121 mg/dL — ABNORMAL HIGH (ref 70–99)
Glucose-Capillary: 198 mg/dL — ABNORMAL HIGH (ref 70–99)
Glucose-Capillary: 215 mg/dL — ABNORMAL HIGH (ref 70–99)
Glucose-Capillary: 285 mg/dL — ABNORMAL HIGH (ref 70–99)

## 2023-05-23 MED ORDER — CHLORHEXIDINE GLUCONATE CLOTH 2 % EX PADS: 6.0000 | MEDICATED_PAD | Freq: Every day | CUTANEOUS | Status: DC

## 2023-05-23 MED ORDER — ENSURE ENLIVE PO LIQD
237.0000 mL | Freq: Two times a day (BID) | ORAL | Status: DC
  Administered 2023-05-23 – 2023-05-25 (×3): 237 mL via ORAL

## 2023-05-23 NOTE — Progress Notes (Signed)
 Pt agitated w/staff and has made verbal threats/insults while completing routine work like vital signs. Educated pt on purpose of routine care and asked to refrain from verbally abusing staff and insults. Reassured pt we are here to help and will respect his wishes if he chooses to refuse any service. Pt wife at bedside.

## 2023-05-23 NOTE — Progress Notes (Signed)
 Initial Nutrition Assessment  DOCUMENTATION CODES:      INTERVENTION:  Discontinue Nepro Ensure Plus High Protein po BID, each supplement provides 350 kcal and 20 grams of protein. -1 packet Juven BID, each packet provides 95 calories, 2.5 grams of protein (collagen), and 9.8 grams of carbohydrate (3 grams sugar); also contains 7 grams of L-arginine and L-glutamine, 300 mg vitamin C, 15 mg vitamin E, 1.2 mcg vitamin B-12, 9.5 mg zinc, 200 mg calcium, and 1.5 g  Calcium Beta-hydroxy-Beta-methylbutyrate to support wound healing Liberalize diet.    NUTRITION DIAGNOSIS:   Increased nutrient needs related to chronic illness as evidenced by estimated needs.    GOAL:   Patient will meet greater than or equal to 90% of their needs    MONITOR:   PO intake  REASON FOR ASSESSMENT:   Consult Assessment of nutrition requirement/status  ASSESSMENT:  83 y.o. male with medical history significant of ESRD-HD (MWF), HTN, HLD, DM, CAD with stent, dCHF, depression with anxiety, BPH, anemia, A-fib on Eliquis, left eye blindness, who presents with cough, shortness of breath, diarrhea, fall. Patient was found to have COVID19.  Unable to speak with pt.  Nor obtain good nutritional history. Reported to have missed dialysis. No appetite changes noted at this time. Suspect patient would benefit better from Ensure than Nepro. Do to the amount of calories and lesser amount of protein.   There is no benefit to excessive dietary restrictions related advanced age, increased nutrient needs. Patient would benefit better from liberalized diet to help meet increased nutrient needs and promote better oral intake.   Hospital weight history: 05/22/23 0500 87.3 kg 192.5 lbs  05/21/23 1650 84.5 kg 186.29 lbs  05/21/23 1343 85.5 kg 188.49 lbs  05/20/23 0500 89.4 kg 197.09 lbs  05/19/23 0844 87.5 kg 192.9 lbs  05/19/23 0422 89.8 kg 198 lbs  05/18/23 0427 90.7 kg 199.96 lbs  05/17/23 1450 90.1 kg 198.6 lbs   05/17/23 0441 90.9 kg 200.4 lbs  05/16/23 0500 91.4 kg 201.5 lbs  05/15/23 1239 92.1 kg 203.04 lbs  05/15/23 0500 93.9 kg 207.01 lbs  05/12/23 1545 95.5 kg 210.54 lbs  05/12/23 0500 95.5 kg 210.54 lbs  05/11/23 0500 95.5 kg 210.54 lbs  05/10/23 1944 95.9 kg 211.42 lbs  05/10/23 1558 97.9 kg 215.83 lbs  05/10/23 0500 97.3 kg 214.51 lbs  05/09/23 0500 96.3 kg 212.3 lbs  05/07/23 1824 94.8 kg 209 lbs  05/07/23 1456 96.8 kg 213.41 lbs  05/06/23 0500 98.4 kg 216.93 lbs  05/05/23 2217 98.4 kg 216.93 lbs  05/05/23 13:10:48 95.3 kg 210 lbs     Average Meal Intake:  100% intake x 2 recorded meals  Nutritionally Relevant Medications: Scheduled Meds:  ascorbic acid  250 mg Oral Daily   bisacodyl  10 mg Oral QHS   calcium acetate  667 mg Oral TID WC   epoetin alfa-epbx (RETACRIT) injection  4,000 Units Intravenous Q M,W,F-HD   feeding supplement (NEPRO CARB STEADY)  237 mL Oral BID BM   guaiFENesin  600 mg Oral BID   insulin aspart  0-5 Units Subcutaneous QHS   insulin aspart  0-9 Units Subcutaneous TID WC   insulin aspart  4 Units Subcutaneous TID WC   insulin glargine-yfgn  5 Units Subcutaneous Daily   multivitamin  1 tablet Oral QHS   QUEtiapine  50 mg Oral BID   traZODone  50 mg Oral QHS     Labs Reviewed: CBG ranges from 263-106 mg/dL over the last 24  hours HgbA1c 7.5    NUTRITION - FOCUSED PHYSICAL EXAM:  Deferred   Diet Order:   Diet Order             Diet renal with fluid restriction Fluid restriction: 1200 mL Fluid; Room service appropriate? Yes; Fluid consistency: Thin  Diet effective now                   EDUCATION NEEDS:   Not appropriate for education at this time  Skin:  Skin Assessment: Skin Integrity Issues: Skin Integrity Issues:: Stage II Stage II: buttocks  Last BM:  05/22/23 type 4  Height:   Ht Readings from Last 1 Encounters:  05/05/23 5\' 9"  (1.753 m)    Weight:   Wt Readings from Last 1 Encounters:  05/22/23 87.3 kg     Ideal Body Weight:     BMI:  Body mass index is 28.43 kg/m.  Estimated Nutritional Needs:   Kcal:  2200-2530 kcal  Protein:  95-120 g  Fluid:  1L + UOP    Jamelle Haring RDN, LDN Clinical Dietitian   If unable to reach, please contact "[facility] Inpatient" secure chat group between 8 am-4 pm daily"

## 2023-05-23 NOTE — Progress Notes (Signed)
 PROGRESS NOTE    Kyle Roberson  ZOX:[id number]  DOB: 1940/04/10  DOA: 05/05/2023 PCP: [name], MD Outpatient Specialists:   Hospital course:  Kyle Roberson is a 83 y.o. male with medical history significant of ESRD-HD (MWF), HTN, HLD, DM, CAD with stent, dCHF, depression with anxiety, BPH, anemia, A-fib on Eliquis, left eye blindness, who presents with cough, shortness of breath, diarrhea, fall.   Per pt and [name] (I called [name] by phone), Kyle Roberson has productive cough, sore throat, SOB, malaise for more than 5 days.  No chest pain.  No fever or chills.  Kyle Roberson has diarrhea for more than 10 days, with 2- 3 times of watery diarrhea each day.  No nausea, vomiting or abdominal pain.  Kyle Roberson fell this morning, no loss of consciousness. He Kyle Roberson states that he was going to the bathroom when he suddenly felt weak in his gave out and fell.  No unilateral numbness or tingling in extremities.  No facial droop or slurred speech.  Kyle Roberson states that he has missed several dialysis. His last dialysis was on Monday.    Data reviewed independently and ED Course: pt was found to have positive COVID 19, WBC 5.6, troponin 95 --> 105, potassium 4.7, bicarbonate 18, creatinine 10.02, BUN 102.  CT head negative. Chest x-ray showed cardiomegaly with vascular congestion.  Kyle Roberson is placed on telemetry bed for observation.     EKG: I have personally reviewed.  A-fib, QTc 461, low voltage, poor IV progression, anteroseptal infarction pattern.   2/22: Hemodynamically stable, getting dialysis.  Lengthy discussion with Kyle Roberson on phone to make decision regarding SNF versus home health, Kyle Roberson does not want to go to rehab himself and becoming agitated and combative. Psych was also consulted for capacity evaluation-pending. Currently being managed with as needed Haldol.   2/23; yesterday evening Kyle Roberson was transferred to stepdown due to softer blood pressure which shows initial poor  response to IV fluid.  Later blood pressure started improving with addition of midodrine.  Kyle Roberson is now at baseline and remained quite combative and rude, refusing most of the care.  Being transferred out of stepdown.   2/24: Kyle Roberson had dialysis today.  Behavior remained the same.  Kyle Roberson does not have any capacity to make any medical decision per psychiatry today.  Kyle Roberson would like him to go to rehab.  TOC to start looking for a place.   2/25: Remained medically stable.  Awaiting SNF placement   2/26: Had dialysis today.  Awaiting SNF   2/27: Kyle Roberson has been accepted at [facility] in [place]. CSW assistant with switching Kyle Roberson's HD center to [place] or [place]. Plan for repeat HD tomorrow. Refused CBG check during lunch.   2/28: Remained hemodynamically stable.  Awaiting dialysis arrangement at [facility] before discharging.   3/1: Remained hemodynamically stable.  Likely will go to rehab on Monday.  3/1: Kyle Roberson without new complaints today but admits to feeling very weak.  Kyle Roberson states that he is very motivated to get out of bed and Kyle Roberson states that she feels that he would like to get home.  They are eager to go to rehab.  Kyle Roberson was apparently walking with rollator prior to COVID.     Objective: Vitals:   05/22/23 1533 05/22/23 2101 05/22/23 2116 05/23/23 0514  BP: (!) 129/51 (!) 111/52  (!) 129/59  Pulse: 63 64  64  Resp: 16 20  18   Temp: 98.4 F (36.9 C) 98.1 F (36.7 C)  (!)  97.1 F (36.2 C)  TempSrc:  Oral  Axillary  SpO2: 98% 100% 100% 100%  Weight:      Height:        Intake/Output Summary (Last 24 hours) at 05/23/2023 1658 Last data filed at 05/23/2023 0900 Gross per 24 hour  Intake 240 ml  Output --  Net 240 ml   Filed Weights   05/21/23 1343 05/21/23 1650 05/22/23 0500  Weight: 85.5 kg 84.5 kg 87.3 kg     Exam:  General: Very weak Kyle Roberson lying in bed with attentive Kyle Roberson much younger than him at bedside Eyes: sclera anicteric, conjuctiva mild  injection bilaterally CVS: S1-S2, regular  Respiratory:  decreased air entry bilaterally secondary to decreased inspiratory effort, rales at bases  GI: NABS, soft, NT  LE: No edema Neuro: A/O x 3,  grossly nonfocal.  Psych: Kyle Roberson is somewhat irritable   Data Reviewed:  Basic Metabolic Panel: Recent Labs  Lab 05/17/23 0932 05/19/23 0850 05/21/23 1347  NA 138 135 134*  K 5.0 4.4 4.5  CL 99 97* 97*  CO2 21* 24 22  GLUCOSE 222* 106* 263*  BUN 96* 71* 59*  CREATININE 7.49* 6.51* 6.40*  CALCIUM 7.6* 7.9* 7.6*  PHOS 7.4* 4.7* 3.3    CBC: Recent Labs  Lab 05/17/23 0932 05/19/23 0850 05/21/23 1347  WBC 10.6* 11.4* 7.4  NEUTROABS 9.1*  --   --   HGB 9.1* 8.9* 8.5*  HCT 27.9* 27.8* 26.8*  MCV 97.2 97.9 98.9  PLT 199 207 232     Scheduled Meds:  apixaban  2.5 mg Oral BID   ascorbic acid  250 mg Oral Daily   bisacodyl  10 mg Oral QHS   budesonide (PULMICORT) nebulizer solution  0.5 mg Nebulization BID   calcium acetate  667 mg Oral TID WC   Chlorhexidine Gluconate Cloth  6 each Topical Daily   [START ON 05/24/2023] Chlorhexidine Gluconate Cloth  6 each Topical Q0600   clotrimazole-betamethasone   Topical BID   dorzolamide-timolol  1 drop Both Eyes Daily   epoetin alfa-epbx (RETACRIT) injection  4,000 Units Intravenous Q M,W,F-HD   feeding supplement  237 mL Oral BID BM   guaiFENesin  600 mg Oral BID   insulin aspart  0-5 Units Subcutaneous QHS   insulin aspart  0-9 Units Subcutaneous TID WC   insulin aspart  4 Units Subcutaneous TID WC   insulin glargine-yfgn  5 Units Subcutaneous Daily   ipratropium-albuterol  3 mL Nebulization BID   midodrine  10 mg Oral TID WC   multivitamin  1 tablet Oral QHS   neomycin-polymyxin b-dexamethasone  1 Application Right Eye TID   pantoprazole  40 mg Oral BID   polyethylene glycol  17 g Oral BID   QUEtiapine  50 mg Oral BID   rosuvastatin  10 mg Oral Once per day on Monday Wednesday Friday   traZODone  50 mg Oral QHS    Continuous Infusions:   Assessment & Plan:   83 year old with ESRD on HD and new COVID asthenia is awaiting placement in SNF, hopefully tomorrow No acute changes to therapeutic regimen as noted below   Copied and pasted from previous note: # Mood disorder with behavioral issues, noncompliance and 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 nightly and Seroquel 25 mg p.o. twice daily 2/20 psych consulted for further recommendation Kyle Roberson remained agitated and eventually psych was able to see him today, Kyle Roberson does not have any capacity -Continue as needed Haldol   #  COVID-19 virus infection: No fever, no leukocytosis, no oxygen desaturation.   Chest x-ray showed vascular congestion, no infiltration. -Bronchodilators, Mucinex 600 mg p.o. twice daily, Robitussin DM as needed for cough -Continue supportive care 2/15 still sob after HD, may benefit from steroids and breathing treatments S/p Prednisone 40 mg p.o. daily x 3 days, Breo Ellipta inhaler Continue albuterol as needed 2/16 Kyle Roberson is refusing inhalers, started on nebulizer treatments if he agrees. 2/18 started Decadron 6 mg p.o. daily x 4 days til 2/21 2/21 decreased Decadron 4 mg p.o. daily for 2 days followed by 2 mg p.o. daily for 2 days due to mental status changes.  Steroids may be contributing. Continues to refuse certain treatments including inhalers and some medication.   # CAD (coronary artery disease) and Myocardial injury:  Troponin 894, but No chest pain and no significant EKG changes Continue to monitor on telemetry S/p ASA 81 mg and heparin IV infusion, discontinued on 2/14, due to GI bleed 2/14 continue to hold Eliquis for now Cardiology consulted, recommended no ischemic workup at this time, follow as an outpatient for Myoview when recovered after COVID infection TTE shows LVEF 55 to 60%, grade 2 diastolic dysfunction, no wall motion abnormality.  Severe pulmonary hypertension, RA  moderately dilated. Moderate TR As per cardio Kyle Roberson may need right and left heart cath due to pulmonary hypertension and elevated troponin.   # Paroxysmal atrial fibrillation: -Tele monitoring - s/p heparin IV infusion, Hb dropped, suspected GI bleed. Kyle Roberson was cleared by GI to continue heparin IV infusion.  2/15 resumed Eliquis 2.5 mg p.o. twice daily as per cardiology 2/27: Received his Eliquis last night and this morning.   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 Hemoglobin 8.9 yesterday.  Follow-up repeat CBC tomorrow   # ESRD on dialysis Nephrology following, had HD yesterday with 2 L removed. Continue hemodialysis MWF schedule Hyperphosphatemia, started PhosLo Will need new outpatient HD center in [place] after discharge to SNF   # Fall at home, initial encounter: CT-head negative -Fall precaution, CK level wnl -PT/OT -recommending SNF   # Type II diabetes mellitus with renal manifestations:  Recent A1c 7.3, poorly controlled for Kyle Roberson taking glipizide at home -SSI 2/18 started Semglee 5 units subcu daily and NovoLog 4 units 3 times daily due to hyperglycemia secondary to steroids. Follow diabetic coordinator and titrate insulin dose accordingly. Monitor CBG and continue diabetic diet 2/27 intermittently receiving insulin or CBG checks. Fasting blood sugar 124 this morning.   # Essential hypertension: Kyle Roberson's not taking medications.   Nonsudden V. tach episode 7 beats run at 630 on 2/13 reported 2/13 started metoprolol 12.5 mg p.o. 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 Difficile negative Now Kyle Roberson developed constipation.   # Constipation, started laxatives on 2/19 2/21 started Dulcolax p.o. Use suppository  and enema as needed    DVT prophylaxis: Eliquis Code Status: Full Family Communication: Kyle Roberson was at bedside throughout     Studies: No results found.  Principal Problem:   COVID-19 Active Problems:   CAD (coronary artery disease)   Myocardial injury   ESRD on dialysis [facility])   Fall   Paroxysmal atrial fibrillation (HCC)   Type II diabetes mellitus with renal manifestations (HCC)   Essential hypertension   Dyslipidemia   Diarrhea   Obesity (BMI 30-39.9)   Weakness   Demand ischemia (HCC)   Uremia of renal origin   Acute delirium     Kyle Roberson Orma Flaming, [facility]  If 7PM-7AM, please contact night-coverage [email or web address]   LOS: 16 days

## 2023-05-23 NOTE — Progress Notes (Signed)
 [facility]  ROUNDING NOTE   Subjective:  Kyle Roberson is a 83 y.o. male with diabetes mellitus type II, hypertension, coronary artery disease, chronic diastolic congestive heart failure, atrial fibrillation, pulmonary hypertension, and historyof subdural hematoma who is admitted for Weakness [R53.1] Fall, initial encounter [W19.XXXA] COVID-19 virus infection [U07.1] COVID-19 [U07.1] Uremia of renal origin [N19]   Patient is known to our practice and receives outpatient dialysis at [facility] on a MWF, supervised by Dr Cherylann Ratel.  Patient seen in bed resting. Wife at bedside. Per wife, patient eats and does well when she is there to encourage positive behavior. No changes since yesterday.    Objective:  Vital signs in last 24 hours:  Temp:  [97.1 F (36.2 C)-98.4 F (36.9 C)] 97.1 F (36.2 C) (03/02 0514) Pulse Rate:  [63-64] 64 (03/02 0514) Resp:  [16-20] 18 (03/02 0514) BP: (111-129)/(51-59) 129/59 (03/02 0514) SpO2:  [98 %-100 %] 100 % (03/02 0514)  Weight change:  Filed Weights   05/21/23 1343 05/21/23 1650 05/22/23 0500  Weight: 85.5 kg 84.5 kg 87.3 kg    Intake/Output: No intake/output data recorded.   Intake/Output this shift:  Total I/O In: 240 [P.O.:240] Out: -   Physical Exam: General: NAD,   Head: Normocephalic, atraumatic. Moist oral mucosal membranes  Eyes: Anicteric, PERRL  Neck: Supple, trachea midline  Lungs:  Clear to auscultation  Heart: Regular rate and rhythm  Abdomen:  Soft, nontender,   Extremities: Trace peripheral edema.  Neurologic: Nonfocal, moving all four extremities  Skin: Scaly bilateral lower extremities  Access: Right chest permcath    Basic Metabolic Panel: Recent Labs  Lab 05/17/23 0932 05/19/23 0850 05/21/23 1347  NA 138 135 134*  K 5.0 4.4 4.5  CL 99 97* 97*  CO2 21* 24 22  GLUCOSE 222* 106* 263*  BUN 96* 71* 59*  CREATININE 7.49* 6.51* 6.40*  CALCIUM 7.6* 7.9* 7.6*  PHOS 7.4* 4.7* 3.3     Liver Function Tests: Recent Labs  Lab 05/17/23 0932 05/19/23 0850 05/21/23 1347  ALBUMIN 2.4* 2.5* 2.3*   No results for input(s): "LIPASE", "AMYLASE" in the last 168 hours. No results for input(s): "AMMONIA" in the last 168 hours.  CBC: Recent Labs  Lab 05/17/23 0932 05/19/23 0850 05/21/23 1347  WBC 10.6* 11.4* 7.4  NEUTROABS 9.1*  --   --   HGB 9.1* 8.9* 8.5*  HCT 27.9* 27.8* 26.8*  MCV 97.2 97.9 98.9  PLT 199 207 232    Cardiac Enzymes: No results for input(s): "CKTOTAL", "CKMB", "CKMBINDEX", "TROPONINI" in the last 168 hours.  BNP: Invalid input(s): "POCBNP"  CBG: Recent Labs  Lab 05/21/23 2233 05/22/23 0817 05/22/23 1154 05/23/23 0904 05/23/23 1225  GLUCAP 196* 133* 215* 121* 198*    Microbiology: Results for orders placed or performed during the hospital encounter of 05/05/23  Resp panel by RT-PCR (RSV, Flu A&B, Covid) Anterior Nasal Swab     Status: Abnormal   Collection Time: 05/05/23  4:33 PM   Specimen: Anterior Nasal Swab  Result Value Ref Range Status   SARS Coronavirus 2 by RT PCR POSITIVE (A) NEGATIVE Final    Comment: (NOTE) SARS-CoV-2 target nucleic acids are DETECTED.  The SARS-CoV-2 RNA is generally detectable in upper respiratory specimens during the acute phase of infection. Positive results are indicative of the presence of the identified virus, but do not rule out bacterial infection or co-infection with other pathogens not detected by the test. Clinical correlation with patient history and other diagnostic  information is necessary to determine patient infection status. The expected result is Negative.  Fact Sheet for Patients: BloggerCourse.com  Fact Sheet for Healthcare Providers: SeriousBroker.it  This test is not yet approved or cleared by the [place] FDA and  has been authorized for detection and/or diagnosis of SARS-CoV-2 by FDA under an Emergency Use  Authorization (EUA).  This EUA will remain in effect (meaning this test can be used) for the duration of  the COVID-19 declaration under Section 564(b)(1) of the A ct, 21 U.S.C. section 360bbb-3(b)(1), unless the authorization is terminated or revoked sooner.     Influenza A by PCR NEGATIVE NEGATIVE Final   Influenza B by PCR NEGATIVE NEGATIVE Final    Comment: (NOTE) The Xpert Xpress SARS-CoV-2/FLU/RSV plus assay is intended as an aid in the diagnosis of influenza from Nasopharyngeal swab specimens and should not be used as a sole basis for treatment. Nasal washings and aspirates are unacceptable for Xpert Xpress SARS-CoV-2/FLU/RSV testing.  Fact Sheet for Patients: BloggerCourse.com  Fact Sheet for Healthcare Providers: SeriousBroker.it  This test is not yet approved or cleared by the [place] FDA and has been authorized for detection and/or diagnosis of SARS-CoV-2 by FDA under an Emergency Use Authorization (EUA). This EUA will remain in effect (meaning this test can be used) for the duration of the COVID-19 declaration under Section 564(b)(1) of the Act, 21 U.S.C. section 360bbb-3(b)(1), unless the authorization is terminated or revoked.     Resp Syncytial Virus by PCR NEGATIVE NEGATIVE Final    Comment: (NOTE) Fact Sheet for Patients: BloggerCourse.com  Fact Sheet for Healthcare Providers: SeriousBroker.it  This test is not yet approved or cleared by the [place] FDA and has been authorized for detection and/or diagnosis of SARS-CoV-2 by FDA under an Emergency Use Authorization (EUA). This EUA will remain in effect (meaning this test can be used) for the duration of the COVID-19 declaration under Section 564(b)(1) of the Act, 21 U.S.C. section 360bbb-3(b)(1), unless the authorization is terminated or revoked.  Performed at [facility], [address] [place], [place] [address]   C Difficile Quick Screen w PCR reflex     Status: None   Collection Time: 05/05/23  7:35 PM   Specimen: STOOL  Result Value Ref Range Status   C Diff antigen NEGATIVE NEGATIVE Final   C Diff toxin NEGATIVE NEGATIVE Final   C Diff interpretation No C. difficile detected.  Final    Comment: Performed at [facility], [address] [place], [place] [address]  MRSA Next Gen by PCR, Nasal     Status: None   Collection Time: 05/15/23  6:37 PM   Specimen: Nasal Mucosa; Nasal Swab  Result Value Ref Range Status   MRSA by PCR Next Gen NOT DETECTED NOT DETECTED Final    Comment: (NOTE) The GeneXpert MRSA Assay (FDA approved for NASAL specimens only), is one component of a comprehensive MRSA colonization surveillance program. It is not intended to diagnose MRSA infection nor to guide or monitor treatment for MRSA infections. Test performance is not FDA approved in patients less than 49 years old. Performed at [facility], [address] [place], [place] [address]     Coagulation Studies: No results for input(s): "LABPROT", "INR" in the last 72 hours.  Urinalysis: No results for input(s): "COLORURINE", "LABSPEC", "PHURINE", "GLUCOSEU", "HGBUR", "BILIRUBINUR", "KETONESUR", "PROTEINUR", "UROBILINOGEN", "NITRITE", "LEUKOCYTESUR" in the last 72 hours.  Invalid input(s): "APPERANCEUR"    Imaging: No results found.  Medications:     apixaban  2.5 mg Oral BID   ascorbic acid  250 mg Oral Daily   bisacodyl  10 mg Oral QHS   budesonide (PULMICORT) nebulizer solution  0.5 mg Nebulization BID   calcium acetate  667 mg Oral TID WC   Chlorhexidine Gluconate Cloth  6 each Topical Daily   clotrimazole-betamethasone   Topical BID   dorzolamide-timolol  1 drop Both Eyes Daily   epoetin alfa-epbx (RETACRIT) injection  4,000 Units Intravenous Q M,W,F-HD   feeding supplement (NEPRO CARB STEADY)  237 mL Oral BID BM   guaiFENesin  600 mg  Oral BID   insulin aspart  0-5 Units Subcutaneous QHS   insulin aspart  0-9 Units Subcutaneous TID WC   insulin aspart  4 Units Subcutaneous TID WC   insulin glargine-yfgn  5 Units Subcutaneous Daily   ipratropium-albuterol  3 mL Nebulization BID   midodrine  10 mg Oral TID WC   multivitamin  1 tablet Oral QHS   neomycin-polymyxin b-dexamethasone  1 Application Right Eye TID   pantoprazole  40 mg Oral BID   polyethylene glycol  17 g Oral BID   QUEtiapine  50 mg Oral BID   rosuvastatin  10 mg Oral Once per day on Monday Wednesday Friday   traZODone  50 mg Oral QHS   acetaminophen, albuterol, benzonatate, bisacodyl, dextromethorphan-guaiFENesin, hydrALAZINE, HYDROmorphone **OR** HYDROmorphone (DILAUDID) injection, loperamide, nitroGLYCERIN, ondansetron (ZOFRAN) IV  Assessment/ Plan:  [name] is a 83 y.o.  male with diabetes mellitus type II, hypertension, coronary artery disease, chronic diastolic congestive heart failure, atrial fibrillation, pulmonary hypertension, and historyof subdural hematoma who is admitted for Weakness [R53.1] Fall, initial encounter [W19.XXXA] COVID-19 virus infection [U07.1] COVID-19 [U07.1] Uremia of renal origin [N19]   [facility]/MWF/right chest PermCath   End-stage renal disease on hemodialysis.  Patient scheduled for dialysis tomorrow.   COVID-19, found positive this admission.  Isolation discontinued on 05/15/23.  Room air.  Respiratory status stable   3. Anemia of chronic kidney disease  Recent Labs           Lab Results  Component Value Date    HGB 8.5 (L) 05/21/2023      Continue Epogen 4000 units IV with dialysis treatments.   4. Secondary Hyperparathyroidism: with outpatient labs: None available   Recent Labs           Lab Results  Component Value Date    PTH 344 (H) 01/07/2023    CALCIUM 7.6 (L) 05/21/2023    CAION 1.14 (L) 12/02/2022    PHOS 3.3 05/21/2023      Will continue to monitor bone minerals.  Decreased calcium acetate to 1 tab, 3 times daily with meals.   5. Malnutrition Patient reports poor intake. Albumin 2.3 Nutrition consult placed.   LOS: 16 Dajanee Voorheis P Windell Moulding 3/2/20251:08 PM

## 2023-05-24 DIAGNOSIS — I25118 Atherosclerotic heart disease of native coronary 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: Secondary | ICD-10-CM | POA: Diagnosis not present

## 2023-05-24 DIAGNOSIS — U071 COVID-19: Secondary | ICD-10-CM | POA: Diagnosis not present

## 2023-05-24 DIAGNOSIS — E1122 Type 2 diabetes mellitus with diabetic chronic kidney disease: Secondary | ICD-10-CM | POA: Diagnosis not present

## 2023-05-24 DIAGNOSIS — I480 Paroxysmal atrial fibrillation: Secondary | ICD-10-CM | POA: Diagnosis not present

## 2023-05-24 LAB — RENAL FUNCTION PANEL
Albumin: 2.3 g/dL — ABNORMAL LOW (ref 3.5–5.0)
Anion gap: 14 (ref 5–15)
BUN: 69 mg/dL — ABNORMAL HIGH (ref 8–23)
CO2: 22 mmol/L (ref 22–32)
Calcium: 7.9 mg/dL — ABNORMAL LOW (ref 8.9–10.3)
Chloride: 103 mmol/L (ref 98–111)
Creatinine, Ser: 7.46 mg/dL — ABNORMAL HIGH (ref 0.61–1.24)
GFR, Estimated: 7 mL/min — ABNORMAL LOW (ref >60)
Glucose, Bld: 197 mg/dL — ABNORMAL HIGH (ref 70–99)
Phosphorus: 5 mg/dL — ABNORMAL HIGH (ref 2.5–4.6)
Potassium: 5 mmol/L (ref 3.5–5.1)
Sodium: 139 mmol/L (ref 135–145)

## 2023-05-24 LAB — CBC
HCT: 27.7 % — ABNORMAL LOW (ref 39.0–52.0)
HCT: 31.1 % — ABNORMAL LOW (ref 39.0–52.0)
Hemoglobin: 8.7 g/dL — ABNORMAL LOW (ref 13.0–17.0)
Hemoglobin: 10 g/dL — ABNORMAL LOW (ref 13.0–17.0)
MCH: 30.9 pg (ref 26.0–34.0)
MCH: 30.8 pg (ref 26.0–34.0)
MCHC: 31.4 g/dL (ref 30.0–36.0)
MCHC: 32.2 g/dL (ref 30.0–36.0)
MCV: 98.2 fL (ref 80.0–100.0)
MCV: 95.7 fL (ref 80.0–100.0)
Platelets: 227 10*3/uL (ref 150–400)
Platelets: 239 10*3/uL (ref 150–400)
RBC: 2.82 MIL/uL — ABNORMAL LOW (ref 4.22–5.81)
RBC: 3.25 MIL/uL — ABNORMAL LOW (ref 4.22–5.81)
RDW: 14.1 % (ref 11.5–15.5)
RDW: 13.8 % (ref 11.5–15.5)
WBC: 8.3 10*3/uL (ref 4.0–10.5)
WBC: 8.8 10*3/uL (ref 4.0–10.5)
nRBC: 0 % (ref 0.0–0.2)
nRBC: 0 % (ref 0.0–0.2)

## 2023-05-24 LAB — GLUCOSE, CAPILLARY
Glucose-Capillary: 181 mg/dL — ABNORMAL HIGH (ref 70–99)
Glucose-Capillary: 154 mg/dL — ABNORMAL HIGH (ref 70–99)

## 2023-05-24 MED ORDER — ALTEPLASE 2 MG IJ SOLR: 2.0000 mg | Freq: Once | INTRAMUSCULAR | Status: DC | PRN

## 2023-05-24 MED ORDER — EPOETIN ALFA-EPBX 4000 UNIT/ML IJ SOLN
INTRAMUSCULAR | Status: AC
Start: 2023-05-24 — End: ?
  Filled 2023-05-24: qty 1

## 2023-05-24 MED ORDER — ANTICOAGULANT SODIUM CITRATE 4% (200MG/5ML) IV SOLN
5.0000 mL | Status: DC | PRN
  Filled 2023-05-24: qty 5

## 2023-05-24 MED ORDER — HEPARIN SODIUM (PORCINE) 1000 UNIT/ML DIALYSIS: 1000.0000 [IU] | INTRAMUSCULAR | Status: DC | PRN

## 2023-05-24 NOTE — Progress Notes (Signed)
 [facility]  ROUNDING NOTE   Subjective:   Kyle Roberson is a 83 y.o. male with diabetes mellitus type II, hypertension, coronary artery disease, chronic diastolic congestive heart failure, atrial fibrillation, pulmonary hypertension, and historyof subdural hematoma who is admitted for Weakness [R53.1] Fall, initial encounter [W19.XXXA] COVID-19 virus infection [U07.1] COVID-19 [U07.1] Uremia of renal origin [N19]  Patient is known to our practice and receives outpatient dialysis at [facility] on a MWF, supervised by Dr Cherylann Ratel. He received his last treatment on Wednesday.   Update:  Patient seen and evaluated during dialysis   HEMODIALYSIS FLOWSHEET:  Blood Flow Rate (mL/min): 349 mL/min Arterial Pressure (mmHg): -164.23 mmHg Venous Pressure (mmHg): 140.6 mmHg TMP (mmHg): 10.7 mmHg Ultrafiltration Rate (mL/min): 828 mL/min Dialysate Flow Rate (mL/min): 299 ml/min Dialysis Fluid Bolus: Normal Saline Bolus Amount (mL): 200 mL  Denies and discomfort  Objective:  Vital signs in last 24 hours:  Temp:  [97.7 F (36.5 C)-98.2 F (36.8 C)] 98.2 F (36.8 C) (03/03 0829) Pulse Rate:  [49-64] 64 (03/03 1130) Resp:  [12-32] 32 (03/03 1130) BP: (104-142)/(41-68) 104/68 (03/03 1130) SpO2:  [94 %-97 %] 95 % (03/03 1130) Weight:  [85.6 kg-88.6 kg] 85.6 kg (03/03 0829)  Weight change:  Filed Weights   05/22/23 0500 05/24/23 0323 05/24/23 0829  Weight: 87.3 kg 88.6 kg 85.6 kg    Intake/Output: I/O last 3 completed shifts: In: 240 [P.O.:240] Out: -    Intake/Output this shift:  No intake/output data recorded.  Physical Exam: General: NAD  Head: Normocephalic, atraumatic. Moist oral mucosal membranes  Eyes: Anicteric  Lungs:  Clear to auscultation, room air  Heart: Regular rate and rhythm  Abdomen:  Soft, nontender  Extremities: Trace to 1+ peripheral edema.  Neurologic: Somnolent, moving all four extremities  Skin: Scaly bilateral lower extremities   Access: Right chest tunneled access    Basic Metabolic Panel: Recent Labs  Lab 05/19/23 0850 05/21/23 1347 05/24/23 0846  NA 135 134* 139  K 4.4 4.5 5.0  CL 97* 97* 103  CO2 24 22 22   GLUCOSE 106* 263* 197*  BUN 71* 59* 69*  CREATININE 6.51* 6.40* 7.46*  CALCIUM 7.9* 7.6* 7.9*  PHOS 4.7* 3.3 5.0*    Liver Function Tests: Recent Labs  Lab 05/19/23 0850 05/21/23 1347 05/24/23 0846  ALBUMIN 2.5* 2.3* 2.3*    No results for input(s): "LIPASE", "AMYLASE" in the last 168 hours.  No results for input(s): "AMMONIA" in the last 168 hours.  CBC: Recent Labs  Lab 05/19/23 0850 05/21/23 1347 05/24/23 0846  WBC 11.4* 7.4 8.3  HGB 8.9* 8.5* 8.7*  HCT 27.8* 26.8* 27.7*  MCV 97.9 98.9 98.2  PLT 207 232 227    Cardiac Enzymes: No results for input(s): "CKTOTAL", "CKMB", "CKMBINDEX", "TROPONINI" in the last 168 hours.   BNP: Invalid input(s): "POCBNP"  CBG: Recent Labs  Lab 05/22/23 1154 05/23/23 0904 05/23/23 1225 05/23/23 1805 05/23/23 2109  GLUCAP 215* 121* 198* 215* 285*    Microbiology: Results for orders placed or performed during the hospital encounter of 05/05/23  Resp panel by RT-PCR (RSV, Flu A&B, Covid) Anterior Nasal Swab     Status: Abnormal   Collection Time: 05/05/23  4:33 PM   Specimen: Anterior Nasal Swab  Result Value Ref Range Status   SARS Coronavirus 2 by RT PCR POSITIVE (A) NEGATIVE Final    Comment: (NOTE) SARS-CoV-2 target nucleic acids are DETECTED.  The SARS-CoV-2 RNA is generally detectable in upper respiratory specimens during the  acute phase of infection. Positive results are indicative of the presence of the identified virus, but do not rule out bacterial infection or co-infection with other pathogens not detected by the test. Clinical correlation with patient history and other diagnostic information is necessary to determine patient infection status. The expected result is Negative.  Fact Sheet for  Patients: BloggerCourse.com  Fact Sheet for Healthcare Providers: SeriousBroker.it  This test is not yet approved or cleared by the [place] FDA and  has been authorized for detection and/or diagnosis of SARS-CoV-2 by FDA under an Emergency Use Authorization (EUA).  This EUA will remain in effect (meaning this test can be used) for the duration of  the COVID-19 declaration under Section 564(b)(1) of the A ct, 21 U.S.C. section 360bbb-3(b)(1), unless the authorization is terminated or revoked sooner.     Influenza A by PCR NEGATIVE NEGATIVE Final   Influenza B by PCR NEGATIVE NEGATIVE Final    Comment: (NOTE) The Xpert Xpress SARS-CoV-2/FLU/RSV plus assay is intended as an aid in the diagnosis of influenza from Nasopharyngeal swab specimens and should not be used as a sole basis for treatment. Nasal washings and aspirates are unacceptable for Xpert Xpress SARS-CoV-2/FLU/RSV testing.  Fact Sheet for Patients: BloggerCourse.com  Fact Sheet for Healthcare Providers: SeriousBroker.it  This test is not yet approved or cleared by the [place] FDA and has been authorized for detection and/or diagnosis of SARS-CoV-2 by FDA under an Emergency Use Authorization (EUA). This EUA will remain in effect (meaning this test can be used) for the duration of the COVID-19 declaration under Section 564(b)(1) of the Act, 21 U.S.C. section 360bbb-3(b)(1), unless the authorization is terminated or revoked.     Resp Syncytial Virus by PCR NEGATIVE NEGATIVE Final    Comment: (NOTE) Fact Sheet for Patients: BloggerCourse.com  Fact Sheet for Healthcare Providers: SeriousBroker.it  This test is not yet approved or cleared by the [place] FDA and has been authorized for detection and/or diagnosis of SARS-CoV-2 by FDA under an Emergency Use  Authorization (EUA). This EUA will remain in effect (meaning this test can be used) for the duration of the COVID-19 declaration under Section 564(b)(1) of the Act, 21 U.S.C. section 360bbb-3(b)(1), unless the authorization is terminated or revoked.  Performed at [facility], [address] [place], [place] [address]   C Difficile Quick Screen w PCR reflex     Status: None   Collection Time: 05/05/23  7:35 PM   Specimen: STOOL  Result Value Ref Range Status   C Diff antigen NEGATIVE NEGATIVE Final   C Diff toxin NEGATIVE NEGATIVE Final   C Diff interpretation No C. difficile detected.  Final    Comment: Performed at [facility], [address] [place], [place] [address]  MRSA Next Gen by PCR, Nasal     Status: None   Collection Time: 05/15/23  6:37 PM   Specimen: Nasal Mucosa; Nasal Swab  Result Value Ref Range Status   MRSA by PCR Next Gen NOT DETECTED NOT DETECTED Final    Comment: (NOTE) The GeneXpert MRSA Assay (FDA approved for NASAL specimens only), is one component of a comprehensive MRSA colonization surveillance program. It is not intended to diagnose MRSA infection nor to guide or monitor treatment for MRSA infections. Test performance is not FDA approved in patients less than 72 years old. Performed at [facility], [address] [place], [place] [address]     Coagulation Studies: No results for input(s): "LABPROT", "INR"  in the last 72 hours.   Urinalysis: No results for input(s): "COLORURINE", "LABSPEC", "PHURINE", "GLUCOSEU", "HGBUR", "BILIRUBINUR", "KETONESUR", "PROTEINUR", "UROBILINOGEN", "NITRITE", "LEUKOCYTESUR" in the last 72 hours.  Invalid input(s): "APPERANCEUR"    Imaging: No results found.    Medications:    anticoagulant sodium citrate        apixaban  2.5 mg Oral BID   ascorbic acid  250 mg Oral Daily   bisacodyl  10 mg Oral QHS   budesonide (PULMICORT) nebulizer solution  0.5 mg Nebulization BID    calcium acetate  667 mg Oral TID WC   Chlorhexidine Gluconate Cloth  6 each Topical Daily   clotrimazole-betamethasone   Topical BID   dorzolamide-timolol  1 drop Both Eyes Daily   epoetin alfa-epbx (RETACRIT) injection  4,000 Units Intravenous Q M,W,F-HD   feeding supplement  237 mL Oral BID BM   guaiFENesin  600 mg Oral BID   insulin aspart  0-5 Units Subcutaneous QHS   insulin aspart  0-9 Units Subcutaneous TID WC   insulin aspart  4 Units Subcutaneous TID WC   insulin glargine-yfgn  5 Units Subcutaneous Daily   ipratropium-albuterol  3 mL Nebulization BID   midodrine  10 mg Oral TID WC   multivitamin  1 tablet Oral QHS   neomycin-polymyxin b-dexamethasone  1 Application Right Eye TID   pantoprazole  40 mg Oral BID   polyethylene glycol  17 g Oral BID   QUEtiapine  50 mg Oral BID   rosuvastatin  10 mg Oral Once per day on Monday Wednesday Friday   traZODone  50 mg Oral QHS   acetaminophen, albuterol, alteplase, anticoagulant sodium citrate, benzonatate, bisacodyl, dextromethorphan-guaiFENesin, heparin, hydrALAZINE, HYDROmorphone **OR** HYDROmorphone (DILAUDID) injection, loperamide, nitroGLYCERIN, ondansetron (ZOFRAN) IV  Assessment/ Plan:  [name] is a 83 y.o.  male with diabetes mellitus type II, hypertension, coronary artery disease, chronic diastolic congestive heart failure, atrial fibrillation, pulmonary hypertension, and historyof subdural hematoma who is admitted for Weakness [R53.1] Fall, initial encounter [W19.XXXA] COVID-19 virus infection [U07.1] COVID-19 [U07.1] Uremia of renal origin [N19]  [facility]/MWF/right chest PermCath  End-stage renal disease on hemodialysis.  Receiving dialysis today, UF 2L as tolerated. Next treatment scheduled for Thursday, due to outpatient schedule. Patient cleared to discharge from renal stance.   COVID-19, found positive this admission.  Isolation discontinued on 05/15/23.  Room air.  Respiratory status  stable  3. Anemia of chronic kidney disease  Lab Results  Component Value Date   HGB 8.7 (L) 05/24/2023   Continue Epogen with dialysis treatments.  4. Secondary Hyperparathyroidism: with outpatient labs: None available  Lab Results  Component Value Date   PTH 344 (H) 01/07/2023   CALCIUM 7.9 (L) 05/24/2023   CAION 1.14 (L) 12/02/2022   PHOS 5.0 (H) 05/24/2023    Calcium and phosphorus acceptable.  Continue calcium acetate 3 times daily with meals.   LOS: 17 Clifford Coudriet 3/3/202512:17 PM

## 2023-05-24 NOTE — Progress Notes (Signed)
 PT Cancellation Note  Patient Details Name: Kyle Roberson MRN: [id number] DOB: June 02, 1940   Cancelled Treatment:    Reason Eval/Treat Not Completed: Patient at procedure or test/unavailable (Pt OTF for HD today. Will attempt PT again at later date/time.)  1:24 PM, 05/24/23 Rosamaria Lints, PT, DPT Physical Therapist - [facility]  [phone] (ASCOM)    [name] 05/24/2023, 1:24 PM

## 2023-05-24 NOTE — Progress Notes (Signed)
 OT Cancellation Note  Patient Details Name: Keymani Mclean MRN: [id number] DOB: 11-02-1940   Cancelled Treatment:    Reason Eval/Treat Not Completed: Patient at procedure or test/ unavailable. Pt noted to be off the floor for dialysis, unavailable at this time. Will continue to follow POC at later date/time as pt available.    Constance Goltz 05/24/2023, 8:35 AM

## 2023-05-24 NOTE — Progress Notes (Signed)
 Hemodialysis Note:  Received patient in bed to unit. Alert and oriented. Informed consent singed and in chart.  Treatment initiated: 0830 Treatment completed: 1220  Access used: Right internal jugular catheter Access issues: None  Patient tolerated well. Transported back to room, alert without acute distress. Report given to patient's RN.  Total UF removed: 2 Liters Medications given: Retacrit 4000 units IV  Post HD weight: 83.6 Kg  [facility]

## 2023-05-24 NOTE — Discharge Planning (Signed)
 PLACEMENT RESOLVED Outpatient  [facility]  [address] [place], [place] [address] Phone: [phone]  Fax: [phone]  Schedule: TTS 3:00pm Start Date: Tuesday 3/4 2:30pm  Dimas Chyle Dialysis Coordinator II  [facility] Cell: [phone] eFax: [phone] [email or web address]

## 2023-05-24 NOTE — Progress Notes (Addendum)
 PROGRESS NOTE    Kyle Roberson  WUJ:[id number]  DOB: Sep 26, 1940  DOA: 05/05/2023 PCP: [name], MD Outpatient Specialists:   Hospital course:  Kyle Roberson is a 83 y.o. male with medical history significant of ESRD-HD (MWF), HTN, HLD, DM, CAD with stent, dCHF, depression with anxiety, BPH, anemia, A-fib on Eliquis, left eye blindness, who presents with cough, shortness of breath, diarrhea, fall.   Per pt and his wife (I called his wife by phone), patient has productive cough, sore throat, SOB, malaise for more than 5 days.  No chest pain.  No fever or chills.  Patient has diarrhea for more than 10 days, with 2- 3 times of watery diarrhea each day.  No nausea, vomiting or abdominal pain.  Patient fell this morning, no loss of consciousness. He patient states that he was going to the bathroom when he suddenly felt weak in his gave out and fell.  No unilateral numbness or tingling in extremities.  No facial droop or slurred speech.  Patient states that he has missed several dialysis. His last dialysis was on Monday.    Data reviewed independently and ED Course: pt was found to have positive COVID 19, WBC 5.6, troponin 95 --> 105, potassium 4.7, bicarbonate 18, creatinine 10.02, BUN 102.  CT head negative. Chest x-ray showed cardiomegaly with vascular congestion.  Patient is placed on telemetry bed for observation.     EKG: I have personally reviewed.  A-fib, QTc 461, low voltage, poor IV progression, anteroseptal infarction pattern.   2/22: Hemodynamically stable, getting dialysis.  Lengthy discussion with wife on phone to make decision regarding SNF versus home health, patient does not want to go to rehab himself and becoming agitated and combative. Psych was also consulted for capacity evaluation-pending. Currently being managed with as needed Haldol.   2/23; yesterday evening patient was transferred to stepdown due to softer blood pressure which shows initial poor  response to IV fluid.  Later blood pressure started improving with addition of midodrine.  Patient is now at baseline and remained quite combative and rude, refusing most of the care.  Being transferred out of stepdown.   2/24: Patient had dialysis today.  Behavior remained the same.  Patient does not have any capacity to make any medical decision per psychiatry today.  Wife would like him to go to rehab.  TOC to start looking for a place.   2/25: Remained medically stable.  Awaiting SNF placement   2/26: Had dialysis today.  Awaiting SNF   2/27: Patient has been accepted at [facility] in [place]. CSW assistant with switching patient's HD center to [place] or [place]. Plan for repeat HD tomorrow. Refused CBG check during lunch.   2/28: Remained hemodynamically stable.  Awaiting dialysis arrangement at [facility] before discharging.   3/1: Remained hemodynamically stable.  Likely will go to rehab on Monday.  3/2: Patient without new complaints today but admits to feeling very weak.  Patient states that he is very motivated to get out of bed and wife states that she feels that he would like to get home.  They are eager to go to rehab.  Patient was apparently walking with rollator prior to COVID.   3/3: Remained hemodynamically stable, now had outpatient dialysis chair with TTS schedule, patient can start from Thursday per nephrology.  Received dialysis today. Likely has to stay another day as transportation need 24-hour notice for going out of state.  Objective: Vitals:   05/24/23 1130 05/24/23  1200 05/24/23 1220 05/24/23 1500  BP: 104/68 (!) 107/48 (!) 119/56 (!) 134/57  Pulse: 64 (!) 53 71 71  Resp: (!) 32 (!) 29 (!) 28 20  Temp:   97.7 F (36.5 C) (!) 97.4 F (36.3 C)  TempSrc:   Oral Oral  SpO2: 95% 99% 96% 100%  Weight:   83.6 kg   Height:        Intake/Output Summary (Last 24 hours) at 05/24/2023 1530 Last data filed at 05/24/2023 1220 Gross per 24 hour  Intake --  Output  2000 ml  Net -2000 ml   Filed Weights   05/24/23 0323 05/24/23 0829 05/24/23 1220  Weight: 88.6 kg 85.6 kg 83.6 kg     Exam:  General.  Frail elderly man, in no acute distress. Pulmonary.  Lungs clear bilaterally, normal respiratory effort. CV.  Regular rate and rhythm, no JVD, rub or murmur. Abdomen.  Soft, nontender, nondistended, BS positive. CNS.  Alert and oriented .  No focal neurologic deficit. Extremities.  No edema, no cyanosis, pulses intact and symmetrical.   Data Reviewed:  Basic Metabolic Panel: Recent Labs  Lab 05/19/23 0850 05/21/23 1347 05/24/23 0846  NA 135 134* 139  K 4.4 4.5 5.0  CL 97* 97* 103  CO2 24 22 22   GLUCOSE 106* 263* 197*  BUN 71* 59* 69*  CREATININE 6.51* 6.40* 7.46*  CALCIUM 7.9* 7.6* 7.9*  PHOS 4.7* 3.3 5.0*    CBC: Recent Labs  Lab 05/19/23 0850 05/21/23 1347 05/24/23 0846 05/24/23 1320  WBC 11.4* 7.4 8.3 8.8  HGB 8.9* 8.5* 8.7* 10.0*  HCT 27.8* 26.8* 27.7* 31.1*  MCV 97.9 98.9 98.2 95.7  PLT 207 232 227 239     Scheduled Meds:  apixaban  2.5 mg Oral BID   ascorbic acid  250 mg Oral Daily   bisacodyl  10 mg Oral QHS   budesonide (PULMICORT) nebulizer solution  0.5 mg Nebulization BID   calcium acetate  667 mg Oral TID WC   Chlorhexidine Gluconate Cloth  6 each Topical Daily   clotrimazole-betamethasone   Topical BID   dorzolamide-timolol  1 drop Both Eyes Daily   epoetin alfa-epbx (RETACRIT) injection  4,000 Units Intravenous Q M,W,F-HD   feeding supplement  237 mL Oral BID BM   guaiFENesin  600 mg Oral BID   insulin aspart  0-5 Units Subcutaneous QHS   insulin aspart  0-9 Units Subcutaneous TID WC   insulin aspart  4 Units Subcutaneous TID WC   insulin glargine-yfgn  5 Units Subcutaneous Daily   ipratropium-albuterol  3 mL Nebulization BID   midodrine  10 mg Oral TID WC   multivitamin  1 tablet Oral QHS   neomycin-polymyxin b-dexamethasone  1 Application Right Eye TID   pantoprazole  40 mg Oral BID    polyethylene glycol  17 g Oral BID   QUEtiapine  50 mg Oral BID   rosuvastatin  10 mg Oral Once per day on Monday Wednesday Friday   traZODone  50 mg Oral QHS   Continuous Infusions:   Assessment & Plan:   83 year old with ESRD on HD and new COVID asthenia is awaiting placement in SNF, hopefully tomorrow No acute changes to therapeutic regimen as noted below   Copied and pasted from previous note: # Mood disorder with behavioral issues, noncompliance and 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 nightly and Seroquel 25 mg p.o. twice daily 2/20 psych consulted for further recommendation Patient remained agitated and  eventually psych was able to see him today, patient does not have any capacity -Continue as needed Haldol   # COVID-19 virus infection: No fever, no leukocytosis, no oxygen desaturation.   Chest x-ray showed vascular congestion, no infiltration. -Bronchodilators, Mucinex 600 mg p.o. twice daily, Robitussin DM as needed for cough -Continue supportive care 2/15 still sob after HD, may benefit from steroids and breathing treatments S/p Prednisone 40 mg p.o. daily x 3 days, Breo Ellipta inhaler Continue albuterol as needed 2/16 patient is refusing inhalers, started on nebulizer treatments if he agrees. 2/18 started Decadron 6 mg p.o. daily x 4 days til 2/21 2/21 decreased Decadron 4 mg p.o. daily for 2 days followed by 2 mg p.o. daily for 2 days due to mental status changes.  Steroids may be contributing. Continues to refuse certain treatments including inhalers and some medication.   # CAD (coronary artery disease) and Myocardial injury:  Troponin 894, but No chest pain and no significant EKG changes Continue to monitor on telemetry S/p ASA 81 mg and heparin IV infusion, discontinued on 2/14, due to GI bleed 2/14 continue to hold Eliquis for now Cardiology consulted, recommended no ischemic workup at this time, follow as an outpatient for  Myoview when recovered after COVID infection TTE shows LVEF 55 to 60%, grade 2 diastolic dysfunction, no wall motion abnormality.  Severe pulmonary hypertension, RA moderately dilated. Moderate TR As per cardio patient may need right and left heart cath due to pulmonary hypertension and elevated troponin.   # Paroxysmal atrial fibrillation: -Tele monitoring - s/p heparin IV infusion, Hb dropped, suspected GI bleed. Patient was cleared by GI to continue heparin IV infusion.  2/15 resumed Eliquis 2.5 mg p.o. twice daily as per cardiology 2/27: Received his Eliquis last night and this morning.   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 Hemoglobin 8.9 yesterday.  Follow-up repeat CBC tomorrow   # ESRD on dialysis Nephrology following, had HD yesterday with 2 L removed. Continue hemodialysis MWF schedule Hyperphosphatemia, started PhosLo Will need new outpatient HD center in [place] after discharge to SNF   # Fall at home, initial encounter: CT-head negative -Fall precaution, CK level wnl -PT/OT -recommending SNF   # Type II diabetes mellitus with renal manifestations:  Recent A1c 7.3, poorly controlled for patient taking glipizide at home -SSI 2/18 started Semglee 5 units subcu daily and NovoLog 4 units 3 times daily due to hyperglycemia secondary to steroids. Follow diabetic coordinator and titrate insulin dose accordingly. Monitor CBG and continue diabetic diet 2/27 intermittently receiving insulin or CBG checks. Fasting blood sugar 124 this morning.   # Essential hypertension: Patient's not taking medications.   Nonsudden V. tach episode 7 beats run at 630 on 2/13 reported 2/13 started metoprolol 12.5 mg p.o. 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 Difficile negative Now patient developed constipation.   # Constipation, started laxatives on 2/19 2/21 started Dulcolax p.o. Use suppository and enema as needed    DVT prophylaxis: Eliquis Code Status: Full Family Communication:      Studies: No results found.  Principal Problem:   COVID-19 Active Problems:   CAD (coronary artery disease)   Myocardial injury   ESRD on dialysis [facility])   Fall   Paroxysmal atrial fibrillation (HCC)   Type II diabetes mellitus with renal manifestations (HCC)   Essential hypertension   Dyslipidemia   Diarrhea   Obesity (BMI 30-39.9)   Weakness   Demand ischemia (HCC)   Uremia of renal origin   Acute delirium     Arnetha Courser, MD [facility]  If 7PM-7AM, please contact night-coverage [email or web address]   LOS: 17 days

## 2023-05-25 DIAGNOSIS — R531 Weakness: Secondary | ICD-10-CM | POA: Diagnosis not present

## 2023-05-25 DIAGNOSIS — I25118 Atherosclerotic heart disease of native coronary 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: Secondary | ICD-10-CM | POA: Diagnosis not present

## 2023-05-25 DIAGNOSIS — U071 COVID-19: Secondary | ICD-10-CM | POA: Diagnosis not present

## 2023-05-25 DIAGNOSIS — W19XXXA Unspecified fall, initial encounter: Secondary | ICD-10-CM | POA: Diagnosis not present

## 2023-05-25 LAB — HEPATITIS B SURFACE ANTIBODY, QUANTITATIVE: Hep B S AB Quant (Post): <3.5 m[IU]/mL — ABNORMAL LOW

## 2023-05-25 MED ORDER — INSULIN ASPART 100 UNIT/ML IJ SOLN: 4.0000 [IU] | Freq: Three times a day (TID) | INTRAMUSCULAR | Status: DC

## 2023-05-25 MED ORDER — CLOTRIMAZOLE-BETAMETHASONE 1-0.05 % EX CREA: TOPICAL_CREAM | Freq: Two times a day (BID) | CUTANEOUS | Status: DC

## 2023-05-25 MED ORDER — ENSURE ENLIVE PO LIQD: 237.0000 mL | Freq: Two times a day (BID) | ORAL | Status: DC

## 2023-05-25 MED ORDER — IPRATROPIUM-ALBUTEROL 0.5-2.5 (3) MG/3ML IN SOLN: 3.0000 mL | Freq: Four times a day (QID) | RESPIRATORY_TRACT | Status: DC

## 2023-05-25 MED ORDER — IPRATROPIUM-ALBUTEROL 0.5-2.5 (3) MG/3ML IN SOLN: 3.0000 mL | Freq: Four times a day (QID) | RESPIRATORY_TRACT | Status: DC | PRN

## 2023-05-25 MED ORDER — TRAZODONE HCL 50 MG PO TABS: 50.0000 mg | ORAL_TABLET | Freq: Every day | ORAL | Status: DC

## 2023-05-25 MED ORDER — MIDODRINE HCL 10 MG PO TABS: 10.0000 mg | ORAL_TABLET | Freq: Three times a day (TID) | ORAL | Status: DC

## 2023-05-25 MED ORDER — BISACODYL 10 MG RE SUPP: 10.0000 mg | Freq: Every day | RECTAL | Status: DC | PRN

## 2023-05-25 MED ORDER — INSULIN ASPART 100 UNIT/ML IJ SOLN
0.0000 [IU] | Freq: Three times a day (TID) | INTRAMUSCULAR | Status: DC
Start: 2023-05-25 — End: 2023-12-10

## 2023-05-25 MED ORDER — POLYETHYLENE GLYCOL 3350 17 G PO PACK: 17.0000 g | PACK | Freq: Every day | ORAL | Status: DC | PRN

## 2023-05-25 MED ORDER — INSULIN GLARGINE-YFGN 100 UNIT/ML ~~LOC~~ SOLN: 5.0000 [IU] | Freq: Every day | SUBCUTANEOUS | Status: DC

## 2023-05-25 MED ORDER — QUETIAPINE FUMARATE 50 MG PO TABS: 50.0000 mg | ORAL_TABLET | Freq: Two times a day (BID) | ORAL | Status: DC

## 2023-05-25 MED ORDER — PANTOPRAZOLE SODIUM 40 MG PO TBEC: 40.0000 mg | DELAYED_RELEASE_TABLET | Freq: Two times a day (BID) | ORAL | Status: DC

## 2023-05-25 NOTE — Progress Notes (Signed)
 [facility]  ROUNDING NOTE   Subjective:   Kyle Roberson is a 83 y.o. male with diabetes mellitus type II, hypertension, coronary artery disease, chronic diastolic congestive heart failure, atrial fibrillation, pulmonary hypertension, and historyof subdural hematoma who is admitted for Weakness [R53.1] Fall, initial encounter [W19.XXXA] COVID-19 virus infection [U07.1] COVID-19 [U07.1] Uremia of renal origin [N19]  Patient is known to our practice and receives outpatient dialysis at [facility] on a MWF, supervised by Dr Cherylann Ratel. He received his last treatment on Wednesday.   Update:  Patient seen laying in bed Alert D/c planned for later today.   Objective:  Vital signs in last 24 hours:  Temp:  [97.4 F (36.3 C)-98.4 F (36.9 C)] 98.1 F (36.7 C) (03/04 1241) Pulse Rate:  [69-76] 69 (03/04 1241) Resp:  [18-20] 20 (03/04 1241) BP: (106-135)/(56-84) 106/56 (03/04 1241) SpO2:  [95 %-100 %] 95 % (03/04 1241) Weight:  [86.5 kg] 86.5 kg (03/04 0500)  Weight change: -2.988 kg Filed Weights   05/24/23 0829 05/24/23 1220 05/25/23 0500  Weight: 85.6 kg 83.6 kg 86.5 kg    Intake/Output: I/O last 3 completed shifts: In: -  Out: 2000 [Other:2000]   Intake/Output this shift:  No intake/output data recorded.  Physical Exam: General: NAD  Head: Normocephalic, atraumatic. Moist oral mucosal membranes  Eyes: Anicteric  Lungs:  Clear to auscultation, room air  Heart: Regular rate and rhythm  Abdomen:  Soft, nontender  Extremities: Trace to 1+ peripheral edema.  Neurologic: Somnolent, moving all four extremities  Skin: Scaly bilateral lower extremities  Access: Right chest tunneled access    Basic Metabolic Panel: Recent Labs  Lab 05/19/23 0850 05/21/23 1347 05/24/23 0846  NA 135 134* 139  K 4.4 4.5 5.0  CL 97* 97* 103  CO2 24 22 22   GLUCOSE 106* 263* 197*  BUN 71* 59* 69*  CREATININE 6.51* 6.40* 7.46*  CALCIUM 7.9* 7.6* 7.9*  PHOS 4.7* 3.3  5.0*    Liver Function Tests: Recent Labs  Lab 05/19/23 0850 05/21/23 1347 05/24/23 0846  ALBUMIN 2.5* 2.3* 2.3*    No results for input(s): "LIPASE", "AMYLASE" in the last 168 hours.  No results for input(s): "AMMONIA" in the last 168 hours.  CBC: Recent Labs  Lab 05/19/23 0850 05/21/23 1347 05/24/23 0846 05/24/23 1320  WBC 11.4* 7.4 8.3 8.8  HGB 8.9* 8.5* 8.7* 10.0*  HCT 27.8* 26.8* 27.7* 31.1*  MCV 97.9 98.9 98.2 95.7  PLT 207 232 227 239    Cardiac Enzymes: No results for input(s): "CKTOTAL", "CKMB", "CKMBINDEX", "TROPONINI" in the last 168 hours.   BNP: Invalid input(s): "POCBNP"  CBG: Recent Labs  Lab 05/23/23 1225 05/23/23 1805 05/23/23 2109 05/24/23 1635 05/24/23 2252  GLUCAP 198* 215* 285* 181* 154*    Microbiology: Results for orders placed or performed during the hospital encounter of 05/05/23  Resp panel by RT-PCR (RSV, Flu A&B, Covid) Anterior Nasal Swab     Status: Abnormal   Collection Time: 05/05/23  4:33 PM   Specimen: Anterior Nasal Swab  Result Value Ref Range Status   SARS Coronavirus 2 by RT PCR POSITIVE (A) NEGATIVE Final    Comment: (NOTE) SARS-CoV-2 target nucleic acids are DETECTED.  The SARS-CoV-2 RNA is generally detectable in upper respiratory specimens during the acute phase of infection. Positive results are indicative of the presence of the identified virus, but do not rule out bacterial infection or co-infection with other pathogens not detected by the test. Clinical correlation with patient history  and other diagnostic information is necessary to determine patient infection status. The expected result is Negative.  Fact Sheet for Patients: BloggerCourse.com  Fact Sheet for Healthcare Providers: SeriousBroker.it  This test is not yet approved or cleared by the [place] FDA and  has been authorized for detection and/or diagnosis of SARS-CoV-2 by FDA under an  Emergency Use Authorization (EUA).  This EUA will remain in effect (meaning this test can be used) for the duration of  the COVID-19 declaration under Section 564(b)(1) of the A ct, 21 U.S.C. section 360bbb-3(b)(1), unless the authorization is terminated or revoked sooner.     Influenza A by PCR NEGATIVE NEGATIVE Final   Influenza B by PCR NEGATIVE NEGATIVE Final    Comment: (NOTE) The Xpert Xpress SARS-CoV-2/FLU/RSV plus assay is intended as an aid in the diagnosis of influenza from Nasopharyngeal swab specimens and should not be used as a sole basis for treatment. Nasal washings and aspirates are unacceptable for Xpert Xpress SARS-CoV-2/FLU/RSV testing.  Fact Sheet for Patients: BloggerCourse.com  Fact Sheet for Healthcare Providers: SeriousBroker.it  This test is not yet approved or cleared by the [place] FDA and has been authorized for detection and/or diagnosis of SARS-CoV-2 by FDA under an Emergency Use Authorization (EUA). This EUA will remain in effect (meaning this test can be used) for the duration of the COVID-19 declaration under Section 564(b)(1) of the Act, 21 U.S.C. section 360bbb-3(b)(1), unless the authorization is terminated or revoked.     Resp Syncytial Virus by PCR NEGATIVE NEGATIVE Final    Comment: (NOTE) Fact Sheet for Patients: BloggerCourse.com  Fact Sheet for Healthcare Providers: SeriousBroker.it  This test is not yet approved or cleared by the [place] FDA and has been authorized for detection and/or diagnosis of SARS-CoV-2 by FDA under an Emergency Use Authorization (EUA). This EUA will remain in effect (meaning this test can be used) for the duration of the COVID-19 declaration under Section 564(b)(1) of the Act, 21 U.S.C. section 360bbb-3(b)(1), unless the authorization is terminated or revoked.  Performed at [facility],  [address] [place], [place] [address]   C Difficile Quick Screen w PCR reflex     Status: None   Collection Time: 05/05/23  7:35 PM   Specimen: STOOL  Result Value Ref Range Status   C Diff antigen NEGATIVE NEGATIVE Final   C Diff toxin NEGATIVE NEGATIVE Final   C Diff interpretation No C. difficile detected.  Final    Comment: Performed at [facility], [address] [place], [place] [address]  MRSA Next Gen by PCR, Nasal     Status: None   Collection Time: 05/15/23  6:37 PM   Specimen: Nasal Mucosa; Nasal Swab  Result Value Ref Range Status   MRSA by PCR Next Gen NOT DETECTED NOT DETECTED Final    Comment: (NOTE) The GeneXpert MRSA Assay (FDA approved for NASAL specimens only), is one component of a comprehensive MRSA colonization surveillance program. It is not intended to diagnose MRSA infection nor to guide or monitor treatment for MRSA infections. Test performance is not FDA approved in patients less than 74 years old. Performed at [facility], [address] [place], [place] [address]     Coagulation Studies: No results for input(s): "LABPROT", "INR" in the last 72 hours.   Urinalysis: No results for input(s): "COLORURINE", "LABSPEC", "PHURINE", "GLUCOSEU", "HGBUR", "BILIRUBINUR", "KETONESUR", "PROTEINUR", "UROBILINOGEN", "NITRITE", "LEUKOCYTESUR" in the last 72 hours.  Invalid input(s): "APPERANCEUR"    Imaging: No  results found.    Medications:        apixaban  2.5 mg Oral BID   ascorbic acid  250 mg Oral Daily   bisacodyl  10 mg Oral QHS   calcium acetate  667 mg Oral TID WC   Chlorhexidine Gluconate Cloth  6 each Topical Daily   clotrimazole-betamethasone   Topical BID   dorzolamide-timolol  1 drop Both Eyes Daily   epoetin alfa-epbx (RETACRIT) injection  4,000 Units Intravenous Q M,W,F-HD   feeding supplement  237 mL Oral BID BM   guaiFENesin  600 mg Oral BID   insulin aspart  0-5 Units Subcutaneous QHS   insulin  aspart  0-9 Units Subcutaneous TID WC   insulin aspart  4 Units Subcutaneous TID WC   insulin glargine-yfgn  5 Units Subcutaneous Daily   midodrine  10 mg Oral TID WC   multivitamin  1 tablet Oral QHS   neomycin-polymyxin b-dexamethasone  1 Application Right Eye TID   pantoprazole  40 mg Oral BID   polyethylene glycol  17 g Oral BID   QUEtiapine  50 mg Oral BID   rosuvastatin  10 mg Oral Once per day on Monday Wednesday Friday   traZODone  50 mg Oral QHS   acetaminophen, benzonatate, bisacodyl, dextromethorphan-guaiFENesin, hydrALAZINE, HYDROmorphone **OR** HYDROmorphone (DILAUDID) injection, ipratropium-albuterol, loperamide, nitroGLYCERIN, ondansetron (ZOFRAN) IV  Assessment/ Plan:  Mr. Noris Kulinski is a 83 y.o.  male with diabetes mellitus type II, hypertension, coronary artery disease, chronic diastolic congestive heart failure, atrial fibrillation, pulmonary hypertension, and historyof subdural hematoma who is admitted for Weakness [R53.1] Fall, initial encounter [W19.XXXA] COVID-19 virus infection [U07.1] COVID-19 [U07.1] Uremia of renal origin [N19]  [facility]/MWF/right chest PermCath  End-stage renal disease on hemodialysis.  Dialysis received yesterday, UF 2L achieved. Next treatment scheduled for Thursday at outpatient clinic.   COVID-19, found positive this admission.  Isolation discontinued on 05/15/23.  Room air.  Respiratory status stable  3. Anemia of chronic kidney disease  Lab Results  Component Value Date   HGB 10.0 (L) 05/24/2023  Hgb within desired range. Will hold ESA.    4. Secondary Hyperparathyroidism: with outpatient labs: None available  Lab Results  Component Value Date   PTH 344 (H) 01/07/2023   CALCIUM 7.9 (L) 05/24/2023   CAION 1.14 (L) 12/02/2022   PHOS 5.0 (H) 05/24/2023    Continue calcium acetate 3 times daily with meals.   LOS: 18 Reality Dejonge 3/4/20251:51 PM

## 2023-05-25 NOTE — Discharge Summary (Signed)
 Physician Discharge Summary   Patient: Kyle Roberson MRN: [id number] DOB: [birth year]  Admit date:     05/05/2023  Discharge date: 05/25/23  Discharge Physician: Arnetha Courser   PCP: [name], MD   Recommendations at discharge:  Please obtain CBC and BMP and follow-up Continue with scheduled dialysis on Tuesday, Thursday and Saturday-okay to start on Thursday per nephrology Follow-up with primary care provider. Please watch for any abnormal bleeding Follow-up with cardiology-patient likely need right and left cardiac catheterization as outpatient. Please hold midodrine for systolic above 130  Discharge Diagnoses: Principal Problem:   COVID-19 Active Problems:   CAD (coronary artery disease)   Myocardial injury   ESRD on dialysis [facility])   Fall   Paroxysmal atrial fibrillation (HCC)   Type II diabetes mellitus with renal manifestations (HCC)   Essential hypertension   Dyslipidemia   Diarrhea   Obesity (BMI 30-39.9)   Weakness   Demand ischemia (HCC)   Uremia of renal origin   Acute delirium   Hospital Course: Deadrian Roberson is a 83 y.o. male with medical history significant of ESRD-HD (MWF), HTN, HLD, DM, CAD with stent, dCHF, depression with anxiety, BPH, anemia, A-fib on Eliquis, left eye blindness, who presents with cough, shortness of breath, diarrhea, fall.  Patient also has diarrhea for more than 10 days, with 2-3 times of watery diarrhea each day.  Per patient he was going to the bathroom when he suddenly felt weak in his legs resulted in fall.  Before admission patient did missed several dialysis.  On presentation pt was found to have positive COVID 19, WBC 5.6, troponin 95 --> 105, potassium 4.7, bicarbonate 18, creatinine 10.02, BUN 102. CT head negative. Chest x-ray showed cardiomegaly with vascular congestion.   Patient received prednisone and supportive care for shortness of breath likely due to COVID-19 virus infection.  Completed the course.  He can  use as needed DuoNeb.  His troponin peaked at 894 with no chest pain.  Initially started on heparin infusion which was discontinued on 2/14 due to concern of GI bleed.  Cardiology was consulted and they recommended no ischemic workup at this time and they can follow-up as outpatient for a possible right and left cardiac catheterization as outpatient.  Echocardiogram shows LVEF 55 to 60%, grade 2 diastolic dysfunction, no wall motion abnormality. Severe pulmonary hypertension, RA moderately dilated. Moderate TR .  Patient with history of paroxysmal atrial fibrillation.  His anticoagulation was held for some time due to concern of GI bleed and it was resumed on 2/15 by cardiology.  He is currently not on any rate control medication.  Patient did had acute on chronic anemia secondary to GI bleed.  No intervention was done and GI is recommending twice daily PPI.  Patient is being discharged on pantoprazole 40 mg twice daily.  Patient was found to have poorly controlled type 2 diabetes with hyperglycemia and A1c of 7.3.  He was taking glipizide at home.  His blood glucoses are being managed with low-dose Semglee and NovoLog and he will continue on discharge.  Patient developed intermittent hypotension and started on midodrine.  He is currently being discharged on 10 mg of midodrine 3 times a day, please hold for systolic above 130.  Patient has diarrhea on presentation likely due to COVID infection which has been resolved.  C. difficile was negative.  Now patient having intermittent constipation for which he will continue using Dulcolax suppositories and MiraLAX as needed.  Patient with a lot of behavioral  issues with concern of mood disorder during current hospitalization.  Was evaluated by psychiatry and they recommended Seroquel 25 mg twice daily and as needed Haldol.  His capacity was also evaluated by psychiatry as he was intermittently refusing care and has a very poor insight of his underlying  illness.  He was found not to have capacity to make medical decisions.  His wife is helping for making medical decisions at this time.  Patient will continue on current medications and need to have a close follow-up with his providers for further recommendations.      Consultants: Cardiology.  Psychiatry.  Nephrology Procedures performed: Hemodialysis Disposition: Skilled nursing facility Diet recommendation:  Discharge Diet Orders (From admission, onward)     Start     Ordered   05/25/23 0000  Diet - low sodium heart healthy        05/25/23 0900           Cardiac and Carb modified diet DISCHARGE MEDICATION: Allergies as of 05/25/2023       Reactions   Atorvastatin Hives, Itching, Other (See Comments)   Other reaction(s): Other (See Comments)   Simvastatin Hives, Itching, Other (See Comments)   Other reaction(s): Other (See Comments)        Medication List     STOP taking these medications    furosemide 80 MG tablet Commonly known as: LASIX       TAKE these medications    acetaminophen 325 MG tablet Commonly known as: TYLENOL Take 1-2 tablets (325-650 mg total) by mouth every 4 (four) hours as needed for mild pain (pain score 1-3).   apixaban 2.5 MG Tabs tablet Commonly known as: Eliquis Take 1 tablet (2.5 mg total) by mouth 2 (two) times daily.   bisacodyl 10 MG suppository Commonly known as: DULCOLAX Place 1 suppository (10 mg total) rectally daily as needed for severe constipation.   Calcium Acetate 667 MG Tabs Take 1 tablet by mouth in the morning, at noon, and at bedtime.   clotrimazole-betamethasone cream Commonly known as: LOTRISONE Apply topically 2 (two) times daily.   dorzolamide-timolol 2-0.5 % ophthalmic solution Commonly known as: COSOPT Place 1 drop into both eyes daily.   feeding supplement Liqd Take 237 mLs by mouth 2 (two) times daily between meals.   FISH OIL PO Take 1 capsule by mouth in the morning.   hydrocerin  Crea Apply 1 Application topically 3 (three) times daily.   insulin aspart 100 UNIT/ML injection Commonly known as: novoLOG Inject 0-9 Units into the skin 3 (three) times daily with meals.   insulin aspart 100 UNIT/ML injection Commonly known as: novoLOG Inject 4 Units into the skin 3 (three) times daily with meals.   insulin glargine-yfgn 100 UNIT/ML injection Commonly known as: SEMGLEE Inject 0.05 mLs (5 Units total) into the skin daily.   ipratropium-albuterol 0.5-2.5 (3) MG/3ML Soln Commonly known as: DUONEB Take 3 mLs by nebulization every 6 (six) hours as needed.   midodrine 10 MG tablet Commonly known as: PROAMATINE Take 1 tablet (10 mg total) by mouth 3 (three) times daily with meals.   multivitamin Tabs tablet Take 1 tablet by mouth at bedtime.   neomycin-polymyxin b-dexamethasone 3.5-10000-0.1 Oint Commonly known as: MAXITROL Place 1 Application into the right eye 3 (three) times daily.   nitroGLYCERIN 0.4 MG/SPRAY spray Commonly known as: NITROLINGUAL Place 1 spray under the tongue as directed.   pantoprazole 40 MG tablet Commonly known as: PROTONIX Take 1 tablet (40 mg total) by mouth  2 (two) times daily.   polyethylene glycol 17 g packet Commonly known as: MIRALAX / GLYCOLAX Take 17 g by mouth daily as needed.   QUEtiapine 50 MG tablet Commonly known as: SEROQUEL Take 1 tablet (50 mg total) by mouth 2 (two) times daily.   rosuvastatin 10 MG tablet Commonly known as: CRESTOR Take 1 tablet (10 mg total) by mouth 3 (three) times a week.   traZODone 50 MG tablet Commonly known as: DESYREL Take 1 tablet (50 mg total) by mouth at bedtime.   VITAMIN C PO Take 1 tablet by mouth in the morning.   Vitamin D3 1.25 MG (50000 UT) Caps Take 1 tablet by mouth in the morning.               Discharge Care Instructions  (From admission, onward)           Start     Ordered   05/25/23 0000  Discharge wound care:       Comments: Apply gel dressing  as needed   05/25/23 0900            Contact information for after-discharge care     Destination     [facility] .   Service: Skilled Nursing Contact information: [address] [place] [address] [phone]                    Discharge Exam: Ceasar Mons Weights   05/24/23 9562 05/24/23 1220 05/25/23 0500  Weight: 85.6 kg 83.6 kg 86.5 kg   General.  Frail elderly man, in no acute distress. Pulmonary.  Lungs clear bilaterally, normal respiratory effort. CV.  Regular rate and rhythm, no JVD, rub or murmur. Abdomen.  Soft, nontender, nondistended, BS positive. CNS.  Alert and oriented .  No focal neurologic deficit. Extremities.  No edema, no cyanosis, pulses intact and symmetrical.  Condition at discharge: stable  The results of significant diagnostics from this hospitalization (including imaging, microbiology, ancillary and laboratory) are listed below for reference.   Imaging Studies: ECHOCARDIOGRAM COMPLETE Result Date: 05/07/2023    ECHOCARDIOGRAM REPORT   Patient Name:   WILBON OBENCHAIN Date of Exam: 05/07/2023 Medical Rec #:  [id number]         Height:       69.0 in Accession #:    [id number]        Weight:       216.9 lb Date of Birth:  02/12/1941        BSA:          2.139 m Patient Age:    82 years          BP:           10/48 mmHg Patient Gender: M                 HR:           44 bpm. Exam Location:  [facility] Procedure: 2D Echo, Cardiac Doppler and Color Doppler (Both Spectral and Color            Flow Doppler were utilized during procedure). Indications:     NSTEMI I21.4  History:         Patient has prior history of Echocardiogram examinations. CAD;                  Risk Factors:Hypertension.  Sonographer:     Neysa Bonito Roar Referring Phys:  [id number] Antonieta Iba Diagnosing Phys: Debbe Odea  MD IMPRESSIONS  1. Left ventricular ejection fraction, by estimation, is 55 to 60%. The left ventricle has normal function. The left  ventricle has no regional wall motion abnormalities. There is mild left ventricular hypertrophy. Left ventricular diastolic parameters are consistent with Grade II diastolic dysfunction (pseudonormalization).  2. Right ventricular systolic function is normal. The right ventricular size is normal. There is severely elevated pulmonary artery systolic pressure.  3. Left atrial size was mildly dilated.  4. Right atrial size was moderately dilated.  5. The mitral valve is normal in structure. Mild mitral valve regurgitation.  6. Tricuspid valve regurgitation is moderate to severe.  7. The aortic valve is tricuspid. Aortic valve regurgitation is mild. Aortic valve sclerosis/calcification is present, without any evidence of aortic stenosis. Aortic valve mean gradient measures 6.0 mmHg.  8. The inferior vena cava is dilated in size with <50% respiratory variability, suggesting right atrial pressure of 15 mmHg. FINDINGS  Left Ventricle: Left ventricular ejection fraction, by estimation, is 55 to 60%. The left ventricle has normal function. The left ventricle has no regional wall motion abnormalities. Strain imaging was not performed. The left ventricular internal cavity  size was normal in size. There is mild left ventricular hypertrophy. Left ventricular diastolic parameters are consistent with Grade II diastolic dysfunction (pseudonormalization). Right Ventricle: The right ventricular size is normal. No increase in right ventricular wall thickness. Right ventricular systolic function is normal. There is severely elevated pulmonary artery systolic pressure. The tricuspid regurgitant velocity is 3.95 m/s, and with an assumed right atrial pressure of 15 mmHg, the estimated right ventricular systolic pressure is 77.4 mmHg. Left Atrium: Left atrial size was mildly dilated. Right Atrium: Right atrial size was moderately dilated. Pericardium: There is no evidence of pericardial effusion. Mitral Valve: The mitral valve is normal  in structure. Mild mitral valve regurgitation. MV peak gradient, 6.9 mmHg. The mean mitral valve gradient is 2.0 mmHg. Tricuspid Valve: The tricuspid valve is normal in structure. Tricuspid valve regurgitation is moderate to severe. Aortic Valve: The aortic valve is tricuspid. Aortic valve regurgitation is mild. Aortic regurgitation PHT measures 454 msec. Aortic valve sclerosis/calcification is present, without any evidence of aortic stenosis. Aortic valve mean gradient measures 6.0  mmHg. Aortic valve peak gradient measures 13.9 mmHg. Aortic valve area, by VTI measures 1.23 cm. Pulmonic Valve: The pulmonic valve was normal in structure. Pulmonic valve regurgitation is trivial. Aorta: The aortic root and ascending aorta are structurally normal, with no evidence of dilitation. Venous: The inferior vena cava is dilated in size with less than 50% respiratory variability, suggesting right atrial pressure of 15 mmHg. IAS/Shunts: No atrial level shunt detected by color flow Doppler. Additional Comments: 3D imaging was not performed.  LEFT VENTRICLE PLAX 2D LVIDd:         5.10 cm   Diastology LVIDs:         3.50 cm   LV e' medial:    5.12 cm/s LV PW:         1.20 cm   LV E/e' medial:  22.5 LV IVS:        1.30 cm   LV e' lateral:   8.93 cm/s LVOT diam:     1.90 cm   LV E/e' lateral: 12.9 LV SV:         44 LV SV Index:   20 LVOT Area:     2.84 cm  RIGHT VENTRICLE RV Basal diam:  3.90 cm RV Mid diam:    3.50 cm RV  S prime:     8.85 cm/s TAPSE (M-mode): 1.5 cm LEFT ATRIUM             Index        RIGHT ATRIUM           Index LA diam:        4.60 cm 2.15 cm/m   RA Area:     27.20 cm LA Vol (A2C):   64.6 ml 30.21 ml/m  RA Volume:   98.40 ml  46.01 ml/m LA Vol (A4C):   86.1 ml 40.26 ml/m LA Biplane Vol: 74.9 ml 35.02 ml/m  AORTIC VALVE                     PULMONIC VALVE AV Area (Vmax):    1.15 cm      PV Vmax:          1.09 m/s AV Area (Vmean):   1.14 cm      PV Peak grad:     4.8 mmHg AV Area (VTI):     1.23 cm       PR End Diast Vel: 7.84 msec AV Vmax:           186.50 cm/s   RVOT Peak grad:   1 mmHg AV Vmean:          112.500 cm/s AV VTI:            0.356 m AV Peak Grad:      13.9 mmHg AV Mean Grad:      6.0 mmHg LVOT Vmax:         75.80 cm/s LVOT Vmean:        45.300 cm/s LVOT VTI:          0.154 m LVOT/AV VTI ratio: 0.43 AI PHT:            454 msec  AORTA Ao Root diam: 2.60 cm Ao Asc diam:  3.70 cm MITRAL VALVE                TRICUSPID VALVE MV Area (PHT): 3.74 cm     TR Peak grad:   62.4 mmHg MV Area VTI:   1.00 cm     TR Vmax:        395.00 cm/s MV Peak grad:  6.9 mmHg MV Mean grad:  2.0 mmHg     SHUNTS MV Vmax:       1.31 m/s     Systemic VTI:  0.15 m MV Vmean:      67.8 cm/s    Systemic Diam: 1.90 cm MV Decel Time: 203 msec MV E velocity: 115.00 cm/s Debbe Odea MD Electronically signed by Debbe Odea MD Signature Date/Time: 05/07/2023/5:18:07 PM    Final    DG Chest Port 1 View Result Date: 05/05/2023 CLINICAL DATA:  Fall diarrhea EXAM: PORTABLE CHEST 1 VIEW COMPARISON:  12/20/2022 FINDINGS: Right-sided central venous catheter tip at the SVC. Cardiomegaly with vascular congestion. No consolidation. Aortic atherosclerosis. No pneumothorax IMPRESSION: Cardiomegaly with vascular congestion. Electronically Signed   By: Jasmine Pang M.D.   On: 05/05/2023 20:38   CT Head Wo Contrast Result Date: 05/05/2023 CLINICAL DATA:  Syncope presyncope EXAM: CT HEAD WITHOUT CONTRAST TECHNIQUE: Contiguous axial images were obtained from the base of the skull through the vertex without intravenous contrast. RADIATION DOSE REDUCTION: This exam was performed according to the departmental dose-optimization program which includes automated exposure control, adjustment of the mA and/or kV according to patient size  and/or use of iterative reconstruction technique. COMPARISON:  CT brain 04/08/2023 FINDINGS: Brain: No acute territorial infarction, hemorrhage or intracranial mass. Atrophy and chronic small vessel ischemic  changes of the white matter. Stable ventricle size. Vascular: No hyperdense vessels.  Carotid vascular calcification. Skull: Normal. Negative for fracture or focal lesion. Left craniotomy Sinuses/Orbits: Mucosal thickening in the ethmoid sinuses Other: None IMPRESSION: 1. No CT evidence for acute intracranial abnormality. 2. Atrophy and chronic small vessel ischemic changes of the white matter. Electronically Signed   By: Jasmine Pang M.D.   On: 05/05/2023 18:17    Microbiology: Results for orders placed or performed during the hospital encounter of 05/05/23  Resp panel by RT-PCR (RSV, Flu A&B, Covid) Anterior Nasal Swab     Status: Abnormal   Collection Time: 05/05/23  4:33 PM   Specimen: Anterior Nasal Swab  Result Value Ref Range Status   SARS Coronavirus 2 by RT PCR POSITIVE (A) NEGATIVE Final    Comment: (NOTE) SARS-CoV-2 target nucleic acids are DETECTED.  The SARS-CoV-2 RNA is generally detectable in upper respiratory specimens during the acute phase of infection. Positive results are indicative of the presence of the identified virus, but do not rule out bacterial infection or co-infection with other pathogens not detected by the test. Clinical correlation with patient history and other diagnostic information is necessary to determine patient infection status. The expected result is Negative.  Fact Sheet for Patients: BloggerCourse.com  Fact Sheet for Healthcare Providers: SeriousBroker.it  This test is not yet approved or cleared by the [place] FDA and  has been authorized for detection and/or diagnosis of SARS-CoV-2 by FDA under an Emergency Use Authorization (EUA).  This EUA will remain in effect (meaning this test can be used) for the duration of  the COVID-19 declaration under Section 564(b)(1) of the A ct, 21 U.S.C. section 360bbb-3(b)(1), unless the authorization is terminated or revoked sooner.     Influenza A  by PCR NEGATIVE NEGATIVE Final   Influenza B by PCR NEGATIVE NEGATIVE Final    Comment: (NOTE) The Xpert Xpress SARS-CoV-2/FLU/RSV plus assay is intended as an aid in the diagnosis of influenza from Nasopharyngeal swab specimens and should not be used as a sole basis for treatment. Nasal washings and aspirates are unacceptable for Xpert Xpress SARS-CoV-2/FLU/RSV testing.  Fact Sheet for Patients: BloggerCourse.com  Fact Sheet for Healthcare Providers: SeriousBroker.it  This test is not yet approved or cleared by the [place] FDA and has been authorized for detection and/or diagnosis of SARS-CoV-2 by FDA under an Emergency Use Authorization (EUA). This EUA will remain in effect (meaning this test can be used) for the duration of the COVID-19 declaration under Section 564(b)(1) of the Act, 21 U.S.C. section 360bbb-3(b)(1), unless the authorization is terminated or revoked.     Resp Syncytial Virus by PCR NEGATIVE NEGATIVE Final    Comment: (NOTE) Fact Sheet for Patients: BloggerCourse.com  Fact Sheet for Healthcare Providers: SeriousBroker.it  This test is not yet approved or cleared by the [place] FDA and has been authorized for detection and/or diagnosis of SARS-CoV-2 by FDA under an Emergency Use Authorization (EUA). This EUA will remain in effect (meaning this test can be used) for the duration of the COVID-19 declaration under Section 564(b)(1) of the Act, 21 U.S.C. section 360bbb-3(b)(1), unless the authorization is terminated or revoked.  Performed at [facility], [address] [place], [place] [address]   C Difficile Quick Screen w PCR reflex     Status:  None   Collection Time: 05/05/23  7:35 PM   Specimen: STOOL  Result Value Ref Range Status   C Diff antigen NEGATIVE NEGATIVE Final   C Diff toxin NEGATIVE NEGATIVE Final   C Diff  interpretation No C. difficile detected.  Final    Comment: Performed at [facility], [address] [place], [place] [address]  MRSA Next Gen by PCR, Nasal     Status: None   Collection Time: 05/15/23  6:37 PM   Specimen: Nasal Mucosa; Nasal Swab  Result Value Ref Range Status   MRSA by PCR Next Gen NOT DETECTED NOT DETECTED Final    Comment: (NOTE) The GeneXpert MRSA Assay (FDA approved for NASAL specimens only), is one component of a comprehensive MRSA colonization surveillance program. It is not intended to diagnose MRSA infection nor to guide or monitor treatment for MRSA infections. Test performance is not FDA approved in patients less than 42 years old. Performed at [facility], [address] [place], [place] [address]     Labs: CBC: Recent Labs  Lab 05/19/23 [phone] 05/21/23 1347 05/24/23 0846 05/24/23 1320  WBC 11.4* 7.4 8.3 8.8  HGB 8.9* 8.5* 8.7* 10.0*  HCT 27.8* 26.8* 27.7* 31.1*  MCV 97.9 98.9 98.2 95.7  PLT 207 232 227 239   Basic Metabolic Panel: Recent Labs  Lab 05/19/23 0850 05/21/23 1347 05/24/23 0846  NA 135 134* 139  K 4.4 4.5 5.0  CL 97* 97* 103  CO2 24 22 22   GLUCOSE 106* 263* 197*  BUN 71* 59* 69*  CREATININE 6.51* 6.40* 7.46*  CALCIUM 7.9* 7.6* 7.9*  PHOS 4.7* 3.3 5.0*   Liver Function Tests: Recent Labs  Lab 05/19/23 0850 05/21/23 1347 05/24/23 0846  ALBUMIN 2.5* 2.3* 2.3*   CBG: Recent Labs  Lab 05/23/23 1225 05/23/23 1805 05/23/23 2109 05/24/23 1635 05/24/23 2252  GLUCAP 198* 215* 285* 181* 154*    Discharge time spent: greater than 30 minutes.  This record has been created using Conservation officer, historic buildings. Errors have been sought and corrected,but may not always be located. Such creation errors do not reflect on the standard of care.   Signed: Arnetha Courser, MD [facility] 05/25/2023

## 2023-05-25 NOTE — TOC Transition Note (Signed)
 Transition of Care [facility]) - Discharge Note   Patient Details  Name: Kyle Roberson MRN: [id number] Date of Birth: October 10, 1940  [facility] CM/SW Contact:  Chapman Fitch, RN Phone Number: 05/25/2023, 9:32 AM   Clinical Narrative:      Patient will DC to: [facility] Anticipated DC date: 05/25/23  Family notified: Wife [name] Transport by:[facility]   Per MD patient ready for DC to . RN, patient, patient's family, and facility notified of DC. Discharge Summary sent to facility. RN given number for report. DC packet on chart. Ambulance transport requested for patient.  TOC signing off.  Final next level of care: Home/Self Care Barriers to Discharge: Continued Medical Work up   Patient Goals and CMS Choice Patient states their goals for this hospitalization and ongoing recovery are:: go home          Discharge Placement                       Discharge Plan and Services Additional resources added to the After Visit Summary for     Discharge Planning Services: CM Consult            DME Arranged: N/A DME Agency: NA       HH Arranged: Refused SNF, Patient Refused HH HH Agency: NA        Social Drivers of Health (SDOH) Interventions SDOH Screenings   Food Insecurity: No Food Insecurity (05/07/2023)  Housing: Low Risk  (05/08/2023)  Transportation Needs: No Transportation Needs (05/08/2023)  Utilities: Not At Risk (05/08/2023)  Depression (PHQ2-9): Low Risk  (07/27/2022)  Tobacco Use: Medium Risk (05/05/2023)     Readmission Risk Interventions    05/12/2023   10:07 AM  Readmission Risk Prevention Plan  Transportation Screening Complete  Medication Review (RN Care Manager) Referral to Pharmacy  PCP or Specialist appointment within 3-5 days of discharge Complete  HRI or Home Care Consult Complete  Palliative Care Screening Not Applicable  Skilled Nursing Facility Not Applicable

## 2023-06-01 ENCOUNTER — Inpatient Hospital Stay: Admitting: Internal Medicine

## 2023-06-04 ENCOUNTER — Encounter (HOSPITAL_COMMUNITY): Payer: Self-pay

## 2023-06-08 ENCOUNTER — Telehealth (HOSPITAL_COMMUNITY): Payer: Self-pay | Admitting: *Deleted

## 2023-06-08 NOTE — Telephone Encounter (Signed)
 Unable to reach pt to give instructions for stress test. Instructions sent earlier on my chart.

## 2023-06-10 ENCOUNTER — Ambulatory Visit (HOSPITAL_COMMUNITY): Payer: Medicare Other | Attending: Cardiology

## 2023-06-12 NOTE — Progress Notes (Deleted)
 Cardiology Clinic Note   Date: 06/12/2023 ID: Denvil Canning, DOB Dec 09, 1940, MRN [id number]  Primary Cardiologist:  Julien Nordmann, MD  Chief Complaint   Kyle Roberson is a 83 y.o. male who presents to the clinic today for ***  Patient Profile   Kyle Roberson is followed by Dr. Mariah Milling for the history outlined below.      Past medical history significant for: CAD. S/p stent to LAD and PTCA to diagonal, moderate RCA 60% ~2008 (performed at [facility] health). Chronic HFpEF/pulmonary hypertension. RHC 12/02/2022: Elevated right> left filling pressures.  Predominantly pulmonary venous hypertension.  Normal cardiac output on milrinone.  PAPi preserved. Echo 05/07/2023: EF 55 to 60%.  No RWMA.  Mild LVH.  Grade II DD.  Normal RV size/function.  Severely elevated PA pressure, RVSP 77.4 mmHg.  Mild LAE.  Moderate RAE.  Mild MR.  Moderate to severe TR.  Mild AI.  Aortic valve sclerosis/calcification without stenosis.  Dilated IVC, RA pressure 15 mmHg. Permanent A-fib. Onset April 2020 in the setting of urosepsis. Hypertension. Hyperlipidemia. T2DM. Subdural hematoma.  Fall August 2022. Cardiorenal syndrome/ESRD.  In summary, patient has a history of CAD with stenting to LAD performed at [facility] health.  He was first evaluated by Dr. Okey Dupre on 03/22/2023 lower extremity edema at the request of Dr. Judithann Graves.  He had been hospitalized in April 2020 for urosepsis and found to be in A-fib.  Echo April 2020 showed EF 60 to 65%, indeterminate diastolic parameters, no RWMA, mildly reduced RV function.  Mild RVH.  He reported PCP told him A-fib was resolved.  He was not on OAC at the time of his visit.  EKG at the time of his visit demonstrated A-fib.  Ventricular rates were controlled and AV nodal blocking agent was not needed at that time.  It was recommended he begin anticoagulation given CHA2DS2-VASc score of 5.  Patient wanted to think about it.  Lasix was increased.  After the visit patient called  the office and agreed to starting Eliquis.  Upon follow-up in February 2021 patient continued to have lower extremity edema and worsening dyspnea.  There was concern secondary to patient report of  "stroke and right eye."  Further information from Dr. Chipper Herb clarified patient had central retinal vein occlusion not an embolic process that could be related to A-fib.  It was determined there is no contraindication to anticoagulation or DCCV if needed in the future.  Repeat echo demonstrated EF 55 to 60%, no RWMA, mild LVH, indeterminate diastolic parameters, normal RV size/function, moderately elevated PA pressure RVSP 55.7 mmHg, moderate LAE, mild to moderate MR, mild AI, mild aortic valve sclerosis without stenosis, moderately dilated pulmonary artery, RA pressure 15 mmHg.  Renal ultrasound March 2021 showed normal size kidneys bilaterally and no significant RAS.  Patient transferred his care to Dr. Mariah Milling after hospital admission in May 2021 for acute heart failure exacerbation.  Hospital admission November 2023 for acute on chronic HFpEF.  Echo demonstrated EF 55 to 60%, no RWMA, mild LVH, indeterminate diastolic parameters, interventricular septum flattened in systole consistent with right ventricular pressure overload, normal RV function, mild RVH, severely elevated PA pressure RVSP 82.6 mmHg, mild BAE, mild MR/AI, mild aortic valve stenosis mean gradient 7 mmHg, moderately dilated pulmonary artery, RA pressure 15 mmHg.  He was diuresed for several days on IV Lasix.  He followed up with Dr. Gala Romney as an outpatient and was managing his volume status fairly well through June 2024.  He was using Furosicx for  breakthrough episodes of increased volume.  Patient was admitted to the hospital September 2024 for acute on chronic HFpEF.  He had been evaluated by nephrologist and noted 35 pound weight gain.  Patient's partner reported slow weight gain over 6 weeks with no response to Furoscix.  He was diuresed with IV  Lasix.  Echo demonstrated normal LV/RV function, mild LVH, moderate RVH, severely elevated PA pressure RVSP 73.4 mmHg, severe LAE, moderate RAE.  RHC was performed showing elevated right> left filling pressures as detailed above.  Patient's renal function worsened and volume status not improving significantly he was started on CRRT.  Plan to transition to hemodialysis.  Dialysis permacath was placed.  He was discharged to inpatient rehab.  Patient was last seen in the office by Dr. Gala Romney on 04/29/2023 for preoperative risk assessment for PD catheter placement.  Patient reported doing well at that time with improved volume status on hemodialysis.  Lexiscan was ordered for further risk stratification secondary to history of CAD.  Patient presented to the ED on 05/05/2023 with complaints of diarrhea x 2 weeks and recent fall.  Initial labs: WBC 5.6, hemoglobin 10.9, sodium 141, potassium 4.7, creatinine 10.02, BUN 102, BNP 320, respiratory panel positive for COVID, C. difficile negative.  Troponin 95>> 105>> 89.  Cardiology consulted for elevated troponin felt to be demand ischemia in the setting of ESRD, COVID, pulmonary hypertension, anemia.  No plan for ischemic workup during hospital admission.  Echo was performed as detailed above.  He remained in the hospital for treatment of COVID.  He was discharged on 05/25/2023.     History of Present Illness    Today, patient ***  CAD S/p stent to LAD and PTCA to diagonal in 2008.  Patient*** -Continue rosuvastatin, as needed SL NTG.  Not on aspirin secondary to Eliquis.  Permanent A-fib Onset April 2020 in the setting of urosepsis.  Denies spontaneous bleeding concerns.  Patient***EKG*** -Continue Eliquis.  Chronic HFpEF/pulmonary hypertension Echo February 2025 showed EF 55 to 60%, no RWMA, mild LVH, Grade II DD, normal RV size/function, severely elevated PA pressure RVSP 77.4 mmHg, mild LAE, moderate RAE, mild MR, moderate to severe TR, mild AI.   Patient*** -Continue***  ROS: All other systems reviewed and are otherwise negative except as noted in History of Present Illness.  EKGs/Labs Reviewed        05/05/2023: ALT 26; AST 24 05/24/2023: BUN 69; Creatinine, Ser 7.46; Potassium 5.0; Sodium 139   05/24/2023: Hemoglobin 10.0; WBC 8.8   No results found for requested labs within last 365 days.   05/05/2023: B Natriuretic Peptide 320.0  ***  Risk Assessment/Calculations    {Does this patient have ATRIAL FIBRILLATION?:[id number]} No BP recorded.  {Refresh Note OR Click here to enter BP  :1}***        Physical Exam    VS:  There were no vitals taken for this visit. , BMI There is no height or weight on file to calculate BMI.  GEN: Well nourished, well developed, in no acute distress. Neck: No JVD or carotid bruits. Cardiac: *** RRR. No murmurs. No rubs or gallops.   Respiratory:  Respirations regular and unlabored. Clear to auscultation without rales, wheezing or rhonchi. GI: Soft, nontender, nondistended. Extremities: Radials/DP/PT 2+ and equal bilaterally. No clubbing or cyanosis. No edema ***  Skin: Warm and dry, no rash. Neuro: Strength intact.  Assessment & Plan   ***  Disposition: ***     {Are you ordering a CV Procedure (e.g. stress test,  cath, DCCV, TEE, etc)?   Press F2        :[id number]}   Signed, Etta Grandchild. Madelene Kaatz, DNP, NP-C

## 2023-06-14 ENCOUNTER — Ambulatory Visit: Payer: Medicare Other | Admitting: Student

## 2023-06-22 ENCOUNTER — Encounter (HOSPITAL_COMMUNITY): Payer: Self-pay

## 2023-06-28 ENCOUNTER — Ambulatory Visit: Admitting: Medical

## 2023-06-29 ENCOUNTER — Telehealth (HOSPITAL_COMMUNITY): Payer: Self-pay | Admitting: Internal Medicine

## 2023-06-29 NOTE — Telephone Encounter (Signed)
 I called to schedule Myoview for patient and wide states that he is in a Rehab facility due to COVID and RSV. He wil not be able to schedule and she will callback if he gets to the point he can do. Order will be removed from the [facility]. If patient calls back we will reinstate the order and schedule. Thank you. He has been really sick.

## 2023-07-01 ENCOUNTER — Telehealth (HOSPITAL_COMMUNITY): Payer: Self-pay

## 2023-07-01 NOTE — Telephone Encounter (Signed)
-----   Message from Donnie Coffin sent at 06/22/2023  7:40 AM EDT ----- Please create an Order for an ATTESTATION ( [id number]) for ordered Myoview/GXT/Stress Echocardiogram.  This must be signed by ordering Provider.  We do not accept verbal cosign.  This test will not be scheduled until ATT is obtained.    I have messaged Dr. B 2 times for ATT and still not done. We can't schedule this test without the ATT, can you help please? Or does patient not need anymore?

## 2023-11-01 ENCOUNTER — Ambulatory Visit: Payer: Self-pay

## 2023-11-03 ENCOUNTER — Ambulatory Visit (INDEPENDENT_AMBULATORY_CARE_PROVIDER_SITE_OTHER): Payer: Self-pay | Admitting: Podiatry

## 2023-11-03 VITALS — Ht 69.0 in | Wt 190.0 lb

## 2023-11-03 DIAGNOSIS — M79675 Pain in left toe(s): Secondary | ICD-10-CM | POA: Diagnosis not present

## 2023-11-03 DIAGNOSIS — M79674 Pain in right toe(s): Secondary | ICD-10-CM | POA: Diagnosis not present

## 2023-11-03 DIAGNOSIS — L600 Ingrowing nail: Secondary | ICD-10-CM | POA: Diagnosis not present

## 2023-11-03 DIAGNOSIS — B351 Tinea unguium: Secondary | ICD-10-CM

## 2023-11-03 DIAGNOSIS — L97522 Non-pressure chronic ulcer of other part of left foot with fat layer exposed: Secondary | ICD-10-CM

## 2023-11-03 MED ORDER — GENTAMICIN SULFATE 0.1 % EX OINT
1.0000 | TOPICAL_OINTMENT | Freq: Every day | CUTANEOUS | 0 refills | Status: DC
Start: 2023-11-03 — End: 2023-12-10

## 2023-11-03 NOTE — Progress Notes (Signed)
  Subjective:  Patient ID: Kyle Roberson, male    DOB: 1941-02-11,  MRN: [id number]  Chief Complaint  Patient presents with   Ingrown Toenail    Rm 2  Patient initially here for ingrown toe nail of the left hallux. Left hallux nail is discolored with some bleeding.Patient is also requesting evaluation of both feet and toe nails and possible nail trimming today.     83 y.o. male presents with the above complaint. History confirmed with patient.  Patient presents today with multiple issues he has chronic wounds in both legs with swelling and fluid buildup has a history of ESRD on HD and heart failure also in Eliquis  for A-fib.  He is currently in assisted living facility in [place] , they have a wound care nurse that is dressing the wounds.  They noticed swelling drainage and a blister coming from the left great toe recently.  They have been referred to vascular surgery who is evaluating them tomorrow.  Denies fever chills nausea vomiting.  The nails are also thick and elongated and causing pain and discomfort he typically has these cut by podiatrist at the facility but they missed their visit last month.  Objective:  Physical Exam: Pulses are nonpalpable he has significant edema capillary refill time is intact the legs are bandaged with oozing and weeping of the bilateral legs the left hallux is erythematous incurvated mycotic and has a large blister with serous drainage present.  Nails are painful on the remaining toenails with mycosis thickening and subungual debris  Assessment:   1. Ulcer of left foot with fat layer exposed (HCC)   2. Ingrowing left great toenail   3. Pain due to onychomycosis of toenails of both feet      Plan:  Patient was evaluated and treated and all questions answered.  Regarding his bilateral lower extremity wounds from volume overload and venous insufficiency I recommended advanced wound care that we are not able to provide here in our office weekly  compression therapy and referral to the wound care center will be placed.  They would prefer to have this done long-term at one of our facilities but they do not know when he will be discharged from the facility currently so referral will be sent to [facility] wound care center to Dr. Katrine at the wound care center in [place].   Has thickened elongated mycotic nails.  Recommended debridement of the nails today. Sharp and mechanical debridement performed of all painful and mycotic nails today. Nails debrided in length and thickness using a nail nipper to level of comfort. Follow up as needed for painful nails.   Regarding a paronychia of the left hallux I recommended removal of the nail plate and drainage.  Following consent and sterile prep with alcohol  the left hallux was anesthetized with 1.5 cc each of 2% lidocaine  and 0.5% Marcaine plain.  The left hallux was then prepped with Betadine exsanguinated and a tourniquet secured around the base of the toe.  A Freer elevator was used to remove the left hallux nail plate it was irrigated with alcohol  and dressed with Silvadene  and a bandage.  Gentamicin  ointment Rx prescribed for application of this and the other remaining nailbed wounds.  Post care instructions were given and attached to today's paperwork.  Culture of the blister fluid around the nail was taken and we will prescribe oral antibiotics as needed.     Return in about 4 weeks (around 12/01/2023) for nail re-check.

## 2023-11-06 LAB — WOUND CULTURE
MICRO NUMBER:: 16826065
SPECIMEN QUALITY:: ADEQUATE

## 2023-11-09 ENCOUNTER — Ambulatory Visit: Payer: Self-pay | Admitting: Podiatry

## 2023-11-09 ENCOUNTER — Telehealth: Payer: Self-pay | Admitting: Podiatry

## 2023-11-09 NOTE — Telephone Encounter (Signed)
 The demographics are missing for the referral. Please send them to:  Attn: [name] Davenport: [phone] Phone: [phone]  Thank you! (I wasn't able to figure out how to print them.)

## 2023-11-09 NOTE — Telephone Encounter (Signed)
 [name] from the referring wound center called to report that the patient's wife stated he is currently in a facility and receiving treatment there for his wound. He is currently on ABT.

## 2023-11-09 NOTE — Telephone Encounter (Signed)
 Sent!

## 2023-11-10 ENCOUNTER — Telehealth: Payer: Self-pay | Admitting: Podiatry

## 2023-11-10 NOTE — Telephone Encounter (Signed)
 Culture was taken in the office last week she needs the report from the culture and sensitivities faxed to the facility per earlier telephone encounter.  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

## 2023-11-10 NOTE — Telephone Encounter (Signed)
 Patients wife called and said that the patient is at [facility] in [place], [place]. She would like for the toxicology reports and a list of medication sent to the facility. The  fax number for the facility is [phone].

## 2023-11-10 NOTE — Telephone Encounter (Signed)
 Patient's wife also asking for clarification about the wound culture report, that states he has MRSA

## 2023-11-10 NOTE — Telephone Encounter (Signed)
 Faxed wound culture report from 11/03/23 and also a list of his current medications in his chart.

## 2023-12-01 ENCOUNTER — Ambulatory Visit: Admitting: Podiatry

## 2023-12-10 ENCOUNTER — Telehealth: Payer: Self-pay | Admitting: *Deleted

## 2023-12-10 NOTE — Telephone Encounter (Signed)
 Pt marked deceased

## 2023-12-22 DEATH — deceased
